# Patient Record
Sex: Male | Born: 1945
Health system: Southern US, Community
[De-identification: ages and names within clinical notes are randomized; demographics above are authoritative.]

## PROBLEM LIST (undated history)

## (undated) ENCOUNTER — Emergency Department (HOSPITAL_COMMUNITY): Discharge status: Against Medical Advice | Payer: Medicare Other

## (undated) DIAGNOSIS — C3490 Malignant neoplasm of unspecified part of unspecified bronchus or lung: Secondary | ICD-10-CM

## (undated) DIAGNOSIS — I10 Essential (primary) hypertension: Secondary | ICD-10-CM

## (undated) DIAGNOSIS — I739 Peripheral vascular disease, unspecified: Secondary | ICD-10-CM

## (undated) DIAGNOSIS — I639 Cerebral infarction, unspecified: Secondary | ICD-10-CM

## (undated) DIAGNOSIS — Z923 Personal history of irradiation: Secondary | ICD-10-CM

## (undated) DIAGNOSIS — R011 Cardiac murmur, unspecified: Secondary | ICD-10-CM

## (undated) DIAGNOSIS — I509 Heart failure, unspecified: Secondary | ICD-10-CM

## (undated) DIAGNOSIS — I2510 Atherosclerotic heart disease of native coronary artery without angina pectoris: Secondary | ICD-10-CM

## (undated) DIAGNOSIS — I499 Cardiac arrhythmia, unspecified: Secondary | ICD-10-CM

## (undated) DIAGNOSIS — D126 Benign neoplasm of colon, unspecified: Secondary | ICD-10-CM

## (undated) DIAGNOSIS — I701 Atherosclerosis of renal artery: Secondary | ICD-10-CM

## (undated) DIAGNOSIS — I350 Nonrheumatic aortic (valve) stenosis: Secondary | ICD-10-CM

## (undated) DIAGNOSIS — D693 Immune thrombocytopenic purpura: Secondary | ICD-10-CM

## (undated) DIAGNOSIS — D649 Anemia, unspecified: Secondary | ICD-10-CM

## (undated) DIAGNOSIS — Z5189 Encounter for other specified aftercare: Secondary | ICD-10-CM

## (undated) DIAGNOSIS — K219 Gastro-esophageal reflux disease without esophagitis: Secondary | ICD-10-CM

## (undated) DIAGNOSIS — E119 Type 2 diabetes mellitus without complications: Secondary | ICD-10-CM

## (undated) DIAGNOSIS — K5732 Diverticulitis of large intestine without perforation or abscess without bleeding: Secondary | ICD-10-CM

## (undated) DIAGNOSIS — G459 Transient cerebral ischemic attack, unspecified: Secondary | ICD-10-CM

## (undated) DIAGNOSIS — E785 Hyperlipidemia, unspecified: Secondary | ICD-10-CM

## (undated) DIAGNOSIS — IMO0001 Reserved for inherently not codable concepts without codable children: Secondary | ICD-10-CM

## (undated) DIAGNOSIS — J449 Chronic obstructive pulmonary disease, unspecified: Secondary | ICD-10-CM

## (undated) HISTORY — PX: POLYPECTOMY: SHX149

## (undated) HISTORY — DX: Peripheral vascular disease, unspecified: I73.9

## (undated) HISTORY — DX: Benign neoplasm of colon, unspecified: D12.6

## (undated) HISTORY — PX: ANGIOPLASTY / STENTING ILIAC: SUR31

## (undated) HISTORY — DX: Atherosclerotic heart disease of native coronary artery without angina pectoris: I25.10

## (undated) HISTORY — DX: Immune thrombocytopenic purpura: D69.3

## (undated) HISTORY — DX: Personal history of irradiation: Z92.3

## (undated) HISTORY — PX: CATARACT EXTRACTION W/ INTRAOCULAR LENS  IMPLANT, BILATERAL: SHX1307

## (undated) HISTORY — DX: Essential (primary) hypertension: I10

## (undated) HISTORY — DX: Transient cerebral ischemic attack, unspecified: G45.9

## (undated) HISTORY — DX: Cardiac murmur, unspecified: R01.1

## (undated) HISTORY — DX: Hyperlipidemia, unspecified: E78.5

## (undated) HISTORY — DX: Diverticulitis of large intestine without perforation or abscess without bleeding: K57.32

## (undated) HISTORY — PX: COLONOSCOPY: SHX174

## (undated) HISTORY — DX: Atherosclerosis of renal artery: I70.1

## (undated) HISTORY — DX: Nonrheumatic aortic (valve) stenosis: I35.0

## (undated) HISTORY — PX: AORTIC VALVE REPLACEMENT: SHX41

## (undated) HISTORY — PX: TONSILLECTOMY: SUR1361

---

## 1997-09-06 DIAGNOSIS — D126 Benign neoplasm of colon, unspecified: Secondary | ICD-10-CM

## 1997-09-06 HISTORY — DX: Benign neoplasm of colon, unspecified: D12.6

## 2000-09-27 ENCOUNTER — Other Ambulatory Visit: Admission: RE | Admit: 2000-09-27 | Discharge: 2000-09-27 | Payer: Self-pay | Admitting: Gastroenterology

## 2000-09-27 ENCOUNTER — Encounter (INDEPENDENT_AMBULATORY_CARE_PROVIDER_SITE_OTHER): Payer: Self-pay

## 2003-01-13 ENCOUNTER — Emergency Department (HOSPITAL_COMMUNITY): Admission: EM | Admit: 2003-01-13 | Discharge: 2003-01-14 | Payer: Self-pay | Admitting: Emergency Medicine

## 2003-01-13 ENCOUNTER — Encounter: Payer: Self-pay | Admitting: Emergency Medicine

## 2003-01-29 ENCOUNTER — Ambulatory Visit (HOSPITAL_COMMUNITY): Admission: RE | Admit: 2003-01-29 | Discharge: 2003-01-29 | Payer: Self-pay | Admitting: Oncology

## 2003-01-29 ENCOUNTER — Encounter: Payer: Self-pay | Admitting: Oncology

## 2003-02-05 ENCOUNTER — Inpatient Hospital Stay (HOSPITAL_COMMUNITY): Admission: EM | Admit: 2003-02-05 | Discharge: 2003-02-09 | Payer: Self-pay | Admitting: Oncology

## 2003-02-05 HISTORY — PX: SPLENECTOMY: SUR1306

## 2003-02-07 ENCOUNTER — Encounter: Payer: Self-pay | Admitting: Cardiology

## 2003-02-07 ENCOUNTER — Encounter: Payer: Self-pay | Admitting: Oncology

## 2003-02-16 ENCOUNTER — Inpatient Hospital Stay (HOSPITAL_COMMUNITY): Admission: RE | Admit: 2003-02-16 | Discharge: 2003-02-18 | Payer: Self-pay | Admitting: *Deleted

## 2003-02-16 ENCOUNTER — Encounter (INDEPENDENT_AMBULATORY_CARE_PROVIDER_SITE_OTHER): Payer: Self-pay | Admitting: *Deleted

## 2003-09-14 ENCOUNTER — Ambulatory Visit (HOSPITAL_COMMUNITY): Admission: RE | Admit: 2003-09-14 | Discharge: 2003-09-14 | Payer: Self-pay | Admitting: Cardiology

## 2003-09-14 ENCOUNTER — Encounter: Payer: Self-pay | Admitting: Cardiology

## 2003-10-23 ENCOUNTER — Ambulatory Visit (HOSPITAL_COMMUNITY): Admission: RE | Admit: 2003-10-23 | Discharge: 2003-10-23 | Payer: Self-pay | Admitting: Cardiovascular Disease

## 2003-11-07 ENCOUNTER — Encounter (INDEPENDENT_AMBULATORY_CARE_PROVIDER_SITE_OTHER): Payer: Self-pay | Admitting: Specialist

## 2003-11-07 ENCOUNTER — Inpatient Hospital Stay (HOSPITAL_COMMUNITY): Admission: RE | Admit: 2003-11-07 | Discharge: 2003-11-14 | Payer: Self-pay | Admitting: Surgery

## 2003-11-07 HISTORY — PX: CARDIAC VALVE REPLACEMENT: SHX585

## 2003-11-07 HISTORY — PX: CARDIAC CATHETERIZATION: SHX172

## 2003-11-07 HISTORY — PX: CORONARY ARTERY BYPASS GRAFT: SHX141

## 2003-12-17 ENCOUNTER — Encounter: Admission: RE | Admit: 2003-12-17 | Discharge: 2004-03-16 | Payer: Self-pay | Admitting: Cardiovascular Disease

## 2004-10-10 ENCOUNTER — Ambulatory Visit: Payer: Self-pay | Admitting: Cardiovascular Disease

## 2004-10-20 ENCOUNTER — Ambulatory Visit: Payer: Self-pay | Admitting: Cardiology

## 2004-11-10 ENCOUNTER — Ambulatory Visit: Payer: Self-pay | Admitting: Cardiology

## 2004-12-03 ENCOUNTER — Ambulatory Visit: Payer: Self-pay | Admitting: Internal Medicine

## 2004-12-31 ENCOUNTER — Ambulatory Visit: Payer: Self-pay | Admitting: Cardiology

## 2005-02-04 ENCOUNTER — Ambulatory Visit: Payer: Self-pay | Admitting: Cardiology

## 2005-02-18 ENCOUNTER — Ambulatory Visit: Payer: Self-pay | Admitting: *Deleted

## 2005-03-04 ENCOUNTER — Ambulatory Visit: Payer: Self-pay | Admitting: *Deleted

## 2005-03-25 ENCOUNTER — Ambulatory Visit: Payer: Self-pay | Admitting: Cardiology

## 2005-04-07 ENCOUNTER — Ambulatory Visit: Payer: Self-pay | Admitting: Cardiovascular Disease

## 2005-04-22 ENCOUNTER — Ambulatory Visit: Payer: Self-pay | Admitting: Cardiology

## 2005-05-20 ENCOUNTER — Ambulatory Visit: Payer: Self-pay | Admitting: Cardiology

## 2005-06-10 ENCOUNTER — Ambulatory Visit: Payer: Self-pay | Admitting: Internal Medicine

## 2005-06-21 ENCOUNTER — Inpatient Hospital Stay (HOSPITAL_COMMUNITY): Admission: EM | Admit: 2005-06-21 | Discharge: 2005-07-03 | Payer: Self-pay | Admitting: Vascular Surgery

## 2005-07-06 ENCOUNTER — Ambulatory Visit: Payer: Self-pay | Admitting: Cardiology

## 2005-07-13 ENCOUNTER — Ambulatory Visit: Payer: Self-pay | Admitting: Cardiology

## 2005-07-20 ENCOUNTER — Ambulatory Visit: Payer: Self-pay | Admitting: Cardiology

## 2005-07-29 ENCOUNTER — Ambulatory Visit: Payer: Self-pay | Admitting: Cardiology

## 2005-08-12 ENCOUNTER — Ambulatory Visit: Payer: Self-pay | Admitting: Cardiology

## 2005-09-09 ENCOUNTER — Ambulatory Visit: Payer: Self-pay | Admitting: Cardiology

## 2005-10-07 ENCOUNTER — Ambulatory Visit: Payer: Self-pay | Admitting: *Deleted

## 2005-10-22 ENCOUNTER — Ambulatory Visit: Payer: Self-pay | Admitting: Cardiovascular Disease

## 2005-11-04 ENCOUNTER — Ambulatory Visit: Payer: Self-pay | Admitting: Cardiology

## 2005-12-14 ENCOUNTER — Ambulatory Visit: Payer: Self-pay | Admitting: Cardiology

## 2006-01-11 ENCOUNTER — Ambulatory Visit: Payer: Self-pay | Admitting: Internal Medicine

## 2006-02-17 ENCOUNTER — Ambulatory Visit: Payer: Self-pay | Admitting: Cardiology

## 2006-04-08 ENCOUNTER — Ambulatory Visit: Payer: Self-pay | Admitting: Cardiovascular Disease

## 2006-05-12 ENCOUNTER — Ambulatory Visit: Payer: Self-pay | Admitting: Cardiology

## 2006-06-11 ENCOUNTER — Ambulatory Visit: Payer: Self-pay | Admitting: Internal Medicine

## 2006-06-25 ENCOUNTER — Ambulatory Visit: Payer: Self-pay | Admitting: Cardiology

## 2006-07-14 ENCOUNTER — Ambulatory Visit: Payer: Self-pay | Admitting: Cardiology

## 2006-09-28 ENCOUNTER — Ambulatory Visit: Payer: Self-pay | Admitting: Cardiology

## 2006-10-12 ENCOUNTER — Ambulatory Visit: Payer: Self-pay | Admitting: *Deleted

## 2006-11-02 ENCOUNTER — Ambulatory Visit: Payer: Self-pay | Admitting: Cardiology

## 2006-12-14 ENCOUNTER — Ambulatory Visit: Payer: Self-pay | Admitting: Cardiology

## 2007-01-11 ENCOUNTER — Ambulatory Visit: Payer: Self-pay | Admitting: Cardiology

## 2007-02-08 ENCOUNTER — Ambulatory Visit: Payer: Self-pay | Admitting: Cardiology

## 2007-03-08 ENCOUNTER — Ambulatory Visit: Payer: Self-pay | Admitting: *Deleted

## 2007-04-15 ENCOUNTER — Ambulatory Visit: Payer: Self-pay | Admitting: Cardiovascular Disease

## 2007-04-27 ENCOUNTER — Ambulatory Visit: Payer: Self-pay

## 2007-05-11 ENCOUNTER — Ambulatory Visit: Payer: Self-pay | Admitting: Cardiology

## 2007-05-13 ENCOUNTER — Ambulatory Visit: Payer: Self-pay | Admitting: Vascular Surgery

## 2007-05-14 ENCOUNTER — Inpatient Hospital Stay (HOSPITAL_COMMUNITY): Admission: AD | Admit: 2007-05-14 | Discharge: 2007-05-18 | Payer: Self-pay | Admitting: Vascular Surgery

## 2007-05-17 ENCOUNTER — Ambulatory Visit: Payer: Self-pay | Admitting: Vascular Surgery

## 2007-05-25 ENCOUNTER — Ambulatory Visit: Payer: Self-pay | Admitting: Cardiology

## 2007-06-08 ENCOUNTER — Ambulatory Visit: Payer: Self-pay | Admitting: Cardiology

## 2007-06-08 LAB — CONVERTED CEMR LAB
INR: 4.6 — ABNORMAL HIGH (ref 0.9–2.0)
Prothrombin Time: 27.6 s — ABNORMAL HIGH (ref 10.0–14.0)

## 2007-06-17 ENCOUNTER — Ambulatory Visit: Payer: Self-pay | Admitting: Cardiology

## 2007-07-07 ENCOUNTER — Ambulatory Visit: Payer: Self-pay | Admitting: Cardiology

## 2007-08-31 ENCOUNTER — Ambulatory Visit: Payer: Self-pay | Admitting: Vascular Surgery

## 2007-09-07 ENCOUNTER — Ambulatory Visit: Payer: Self-pay | Admitting: Cardiology

## 2007-10-05 ENCOUNTER — Ambulatory Visit: Payer: Self-pay | Admitting: Cardiology

## 2007-10-26 ENCOUNTER — Ambulatory Visit: Payer: Self-pay | Admitting: Cardiovascular Disease

## 2007-10-26 ENCOUNTER — Ambulatory Visit: Payer: Self-pay | Admitting: Cardiology

## 2007-10-26 LAB — CONVERTED CEMR LAB
INR: 3.5 — ABNORMAL HIGH (ref 0.8–1.0)
Prothrombin Time: 23.9 s — ABNORMAL HIGH (ref 10.9–13.3)

## 2007-11-16 ENCOUNTER — Ambulatory Visit: Payer: Self-pay | Admitting: Cardiovascular Disease

## 2007-12-14 ENCOUNTER — Ambulatory Visit: Payer: Self-pay | Admitting: Cardiovascular Disease

## 2008-01-12 ENCOUNTER — Ambulatory Visit: Payer: Self-pay | Admitting: Cardiology

## 2008-02-14 ENCOUNTER — Ambulatory Visit: Payer: Self-pay | Admitting: Cardiology

## 2008-03-06 ENCOUNTER — Ambulatory Visit: Payer: Self-pay | Admitting: Internal Medicine

## 2008-08-09 ENCOUNTER — Ambulatory Visit: Payer: Self-pay | Admitting: Vascular Surgery

## 2008-10-03 ENCOUNTER — Ambulatory Visit: Payer: Self-pay | Admitting: Cardiology

## 2008-10-12 ENCOUNTER — Ambulatory Visit: Payer: Self-pay | Admitting: Cardiovascular Disease

## 2008-10-22 DIAGNOSIS — K5732 Diverticulitis of large intestine without perforation or abscess without bleeding: Secondary | ICD-10-CM | POA: Insufficient documentation

## 2008-10-22 DIAGNOSIS — D693 Immune thrombocytopenic purpura: Secondary | ICD-10-CM | POA: Insufficient documentation

## 2008-10-22 DIAGNOSIS — Z860100 Personal history of colon polyps, unspecified: Secondary | ICD-10-CM | POA: Insufficient documentation

## 2008-10-22 DIAGNOSIS — I739 Peripheral vascular disease, unspecified: Secondary | ICD-10-CM | POA: Insufficient documentation

## 2008-10-22 DIAGNOSIS — E119 Type 2 diabetes mellitus without complications: Secondary | ICD-10-CM | POA: Insufficient documentation

## 2008-10-22 DIAGNOSIS — I10 Essential (primary) hypertension: Secondary | ICD-10-CM | POA: Insufficient documentation

## 2008-10-22 DIAGNOSIS — E7800 Pure hypercholesterolemia, unspecified: Secondary | ICD-10-CM | POA: Insufficient documentation

## 2008-10-22 DIAGNOSIS — I359 Nonrheumatic aortic valve disorder, unspecified: Secondary | ICD-10-CM | POA: Insufficient documentation

## 2008-10-26 ENCOUNTER — Ambulatory Visit: Payer: Self-pay | Admitting: Gastroenterology

## 2008-11-05 ENCOUNTER — Ambulatory Visit: Payer: Self-pay | Admitting: Cardiovascular Disease

## 2008-11-07 ENCOUNTER — Ambulatory Visit: Payer: Self-pay | Admitting: Gastroenterology

## 2008-11-15 ENCOUNTER — Ambulatory Visit: Payer: Self-pay | Admitting: Cardiology

## 2008-12-10 ENCOUNTER — Ambulatory Visit: Payer: Self-pay | Admitting: Internal Medicine

## 2009-01-10 ENCOUNTER — Ambulatory Visit: Payer: Self-pay | Admitting: Internal Medicine

## 2009-02-07 ENCOUNTER — Ambulatory Visit: Payer: Self-pay | Admitting: Cardiovascular Disease

## 2009-04-10 ENCOUNTER — Ambulatory Visit: Payer: Self-pay | Admitting: Cardiovascular Disease

## 2009-05-08 ENCOUNTER — Ambulatory Visit: Payer: Self-pay | Admitting: Cardiology

## 2009-05-08 ENCOUNTER — Encounter: Payer: Self-pay | Admitting: *Deleted

## 2009-05-08 LAB — CONVERTED CEMR LAB
POC INR: 5.4
Protime: 23.6

## 2009-05-17 ENCOUNTER — Ambulatory Visit: Payer: Self-pay | Admitting: Cardiovascular Disease

## 2009-05-22 ENCOUNTER — Ambulatory Visit: Payer: Self-pay | Admitting: Cardiology

## 2009-05-22 LAB — CONVERTED CEMR LAB
POC INR: 1.7
Protime: 16.1

## 2009-06-06 DEATH — deceased

## 2009-06-12 ENCOUNTER — Ambulatory Visit: Payer: Self-pay | Admitting: Internal Medicine

## 2009-06-12 ENCOUNTER — Encounter: Payer: Self-pay | Admitting: *Deleted

## 2009-06-12 LAB — CONVERTED CEMR LAB
POC INR: 2.9
Prothrombin Time: 20.5 s

## 2009-06-18 ENCOUNTER — Encounter: Payer: Self-pay | Admitting: Cardiovascular Disease

## 2009-06-18 ENCOUNTER — Ambulatory Visit: Payer: Self-pay

## 2009-08-14 ENCOUNTER — Ambulatory Visit: Payer: Self-pay | Admitting: Vascular Surgery

## 2009-08-23 ENCOUNTER — Encounter (INDEPENDENT_AMBULATORY_CARE_PROVIDER_SITE_OTHER): Payer: Self-pay | Admitting: *Deleted

## 2009-09-03 ENCOUNTER — Emergency Department (HOSPITAL_COMMUNITY): Admission: EM | Admit: 2009-09-03 | Discharge: 2009-09-03 | Payer: Self-pay | Admitting: Emergency Medicine

## 2009-09-05 ENCOUNTER — Encounter: Payer: Self-pay | Admitting: Cardiovascular Disease

## 2009-09-09 ENCOUNTER — Encounter (INDEPENDENT_AMBULATORY_CARE_PROVIDER_SITE_OTHER): Payer: Self-pay | Admitting: Internal Medicine

## 2009-09-09 ENCOUNTER — Ambulatory Visit: Payer: Self-pay

## 2009-09-09 ENCOUNTER — Ambulatory Visit: Payer: Self-pay | Admitting: Cardiovascular Disease

## 2009-09-09 ENCOUNTER — Ambulatory Visit (HOSPITAL_COMMUNITY): Admission: RE | Admit: 2009-09-09 | Discharge: 2009-09-09 | Payer: Self-pay | Admitting: Cardiovascular Disease

## 2009-09-11 ENCOUNTER — Ambulatory Visit: Payer: Self-pay | Admitting: Internal Medicine

## 2009-09-11 LAB — CONVERTED CEMR LAB: POC INR: 3.3

## 2009-09-13 ENCOUNTER — Encounter: Payer: Self-pay | Admitting: Cardiovascular Disease

## 2009-10-02 ENCOUNTER — Ambulatory Visit: Payer: Self-pay

## 2009-10-02 LAB — CONVERTED CEMR LAB: POC INR: 2.8

## 2009-10-04 DIAGNOSIS — L719 Rosacea, unspecified: Secondary | ICD-10-CM | POA: Insufficient documentation

## 2009-10-04 DIAGNOSIS — K219 Gastro-esophageal reflux disease without esophagitis: Secondary | ICD-10-CM | POA: Insufficient documentation

## 2009-10-04 DIAGNOSIS — G4700 Insomnia, unspecified: Secondary | ICD-10-CM | POA: Insufficient documentation

## 2009-10-09 ENCOUNTER — Telehealth: Payer: Self-pay | Admitting: Cardiovascular Disease

## 2009-10-10 ENCOUNTER — Ambulatory Visit: Payer: Self-pay | Admitting: Cardiovascular Disease

## 2009-10-10 DIAGNOSIS — G459 Transient cerebral ischemic attack, unspecified: Secondary | ICD-10-CM | POA: Insufficient documentation

## 2009-11-12 ENCOUNTER — Ambulatory Visit: Payer: Self-pay | Admitting: Cardiovascular Disease

## 2009-11-12 LAB — CONVERTED CEMR LAB: POC INR: 1.8

## 2009-12-10 ENCOUNTER — Ambulatory Visit: Payer: Self-pay | Admitting: Cardiology

## 2009-12-10 LAB — CONVERTED CEMR LAB: POC INR: 1.8

## 2010-01-07 ENCOUNTER — Ambulatory Visit: Payer: Self-pay | Admitting: Internal Medicine

## 2010-01-07 LAB — CONVERTED CEMR LAB: POC INR: 2.3

## 2010-02-04 ENCOUNTER — Ambulatory Visit: Payer: Self-pay | Admitting: Cardiovascular Disease

## 2010-02-04 LAB — CONVERTED CEMR LAB: POC INR: 2.3

## 2010-02-12 ENCOUNTER — Encounter: Payer: Self-pay | Admitting: Cardiovascular Disease

## 2010-02-12 ENCOUNTER — Ambulatory Visit: Payer: Self-pay | Admitting: Vascular Surgery

## 2010-02-13 DIAGNOSIS — E1151 Type 2 diabetes mellitus with diabetic peripheral angiopathy without gangrene: Secondary | ICD-10-CM | POA: Insufficient documentation

## 2010-02-14 ENCOUNTER — Encounter: Payer: Self-pay | Admitting: Cardiovascular Disease

## 2010-03-11 ENCOUNTER — Ambulatory Visit: Payer: Self-pay | Admitting: Cardiovascular Disease

## 2010-04-09 ENCOUNTER — Ambulatory Visit: Payer: Self-pay | Admitting: Cardiology

## 2010-04-09 LAB — CONVERTED CEMR LAB: POC INR: 2.8

## 2010-05-07 ENCOUNTER — Ambulatory Visit: Payer: Self-pay | Admitting: Cardiology

## 2010-05-07 LAB — CONVERTED CEMR LAB: POC INR: 2.5

## 2010-06-04 ENCOUNTER — Ambulatory Visit: Payer: Self-pay | Admitting: Cardiology

## 2010-06-04 LAB — CONVERTED CEMR LAB: POC INR: 2.6

## 2010-07-02 ENCOUNTER — Ambulatory Visit: Payer: Self-pay | Admitting: Cardiology

## 2010-07-02 LAB — CONVERTED CEMR LAB: POC INR: 2.4

## 2010-07-29 ENCOUNTER — Ambulatory Visit: Payer: Self-pay | Admitting: Cardiovascular Disease

## 2010-07-29 LAB — CONVERTED CEMR LAB: POC INR: 20479111

## 2010-08-26 ENCOUNTER — Ambulatory Visit: Payer: Self-pay | Admitting: Cardiology

## 2010-08-26 LAB — CONVERTED CEMR LAB: POC INR: 2.2

## 2010-09-24 ENCOUNTER — Encounter: Payer: Self-pay | Admitting: Cardiovascular Disease

## 2010-09-24 ENCOUNTER — Ambulatory Visit: Payer: Self-pay | Admitting: Vascular Surgery

## 2010-10-21 ENCOUNTER — Ambulatory Visit: Payer: Self-pay | Admitting: Cardiovascular Disease

## 2010-10-21 ENCOUNTER — Encounter: Payer: Self-pay | Admitting: Cardiovascular Disease

## 2010-10-21 DIAGNOSIS — I2510 Atherosclerotic heart disease of native coronary artery without angina pectoris: Secondary | ICD-10-CM | POA: Insufficient documentation

## 2010-10-22 DIAGNOSIS — J309 Allergic rhinitis, unspecified: Secondary | ICD-10-CM | POA: Insufficient documentation

## 2010-10-22 DIAGNOSIS — Z952 Presence of prosthetic heart valve: Secondary | ICD-10-CM | POA: Insufficient documentation

## 2010-11-17 ENCOUNTER — Ambulatory Visit: Payer: Self-pay | Admitting: Cardiovascular Disease

## 2010-11-21 ENCOUNTER — Ambulatory Visit: Payer: Self-pay

## 2010-11-26 ENCOUNTER — Telehealth: Payer: Self-pay | Admitting: Cardiovascular Disease

## 2010-12-10 ENCOUNTER — Encounter: Payer: Self-pay | Admitting: Cardiovascular Disease

## 2010-12-12 ENCOUNTER — Ambulatory Visit
Admission: RE | Admit: 2010-12-12 | Discharge: 2010-12-12 | Payer: Self-pay | Source: Home / Self Care | Attending: Internal Medicine | Admitting: Internal Medicine

## 2010-12-12 LAB — CONVERTED CEMR LAB: POC INR: 2.7

## 2010-12-17 ENCOUNTER — Encounter: Payer: Self-pay | Admitting: Cardiovascular Disease

## 2010-12-19 ENCOUNTER — Ambulatory Visit (HOSPITAL_COMMUNITY)
Admission: RE | Admit: 2010-12-19 | Discharge: 2010-12-19 | Payer: Self-pay | Source: Home / Self Care | Attending: Cardiovascular Disease | Admitting: Cardiovascular Disease

## 2010-12-22 LAB — CBC
HCT: 44.2 % (ref 39.0–52.0)
Hemoglobin: 15.2 g/dL (ref 13.0–17.0)
MCH: 32.4 pg (ref 26.0–34.0)
MCHC: 34.4 g/dL (ref 30.0–36.0)
MCV: 94.2 fL (ref 78.0–100.0)
Platelets: 228 10*3/uL (ref 150–400)
RBC: 4.69 MIL/uL (ref 4.22–5.81)
RDW: 13.5 % (ref 11.5–15.5)
WBC: 7 10*3/uL (ref 4.0–10.5)

## 2010-12-22 LAB — BASIC METABOLIC PANEL
BUN: 9 mg/dL (ref 6–23)
CO2: 25 mEq/L (ref 19–32)
Calcium: 9.2 mg/dL (ref 8.4–10.5)
Chloride: 105 mEq/L (ref 96–112)
Creatinine, Ser: 0.81 mg/dL (ref 0.4–1.5)
GFR calc Af Amer: >60 mL/min (ref >60)
GFR calc non Af Amer: >60 mL/min (ref >60)
Glucose, Bld: 217 mg/dL — ABNORMAL HIGH (ref 70–99)
Potassium: 4.4 mEq/L (ref 3.5–5.1)
Sodium: 140 mEq/L (ref 135–145)

## 2010-12-22 LAB — PROTIME-INR
INR: 0.85 (ref 0.00–1.49)
Prothrombin Time: 11.8 seconds (ref 11.6–15.2)

## 2010-12-22 LAB — GLUCOSE, CAPILLARY
Glucose-Capillary: 199 mg/dL — ABNORMAL HIGH (ref 70–99)
Glucose-Capillary: 155 mg/dL — ABNORMAL HIGH (ref 70–99)

## 2010-12-23 ENCOUNTER — Ambulatory Visit: Admission: RE | Admit: 2010-12-23 | Discharge: 2010-12-23 | Payer: Self-pay | Source: Home / Self Care

## 2010-12-23 LAB — CONVERTED CEMR LAB: POC INR: 1.8

## 2010-12-30 ENCOUNTER — Ambulatory Visit: Admission: RE | Admit: 2010-12-30 | Discharge: 2010-12-30 | Payer: Self-pay | Source: Home / Self Care

## 2010-12-30 LAB — CONVERTED CEMR LAB: POC INR: 1.9

## 2011-01-01 ENCOUNTER — Telehealth: Payer: Self-pay | Admitting: Cardiovascular Disease

## 2011-01-06 NOTE — Assessment & Plan Note (Signed)
Summary: f98m/mj   Visit Type:  6 mo f/u Primary Provider:  [name],MD  CC:  denies any cardiac complaints today.  History of Present Illness: [name] [name] is seen today for F/U.  He is S/P AVR on chronic anticoagulation.  He had a possible TIA last November when his INR was subRx.  His does has been increased and running better with no recurrent TIA;s/  I told him to take a baby ASA with his coumadin.  AVR normal by TTE and he did not want to have a TEE.  His lipids and BP have been under good control  He continues to have 2 martini's a night.  He denies SOB, palpitations, SSCP or edema.  He has had a distant left iliac stent.  Normally followed by Dr Darrick Penna.  Wants to switch care to [place].  No current claudication.  Incidicates ABI's last month or two with Dr Darrick Penna  Will get records and refer to Dr Excell Seltzer.  HAD CABG with AVR in 2004  No angina.  ON baby asa and BB.    Current Problems (verified): 1)  Tia  (ICD-435.9) 2)  Immune Thrombocytopenic Purpura  (ICD-287.31) 3)  Diabetes Mellitus  (ICD-250.00) 4)  Aortic Stenosis  (ICD-424.1) 5)  Claudication  (ICD-443.9) 6)  Hypertension  (ICD-401.9) 7)  Hypercholesterolemia  (ICD-272.0) 8)  Coumadin Therapy  (ICD-V58.61) 9)  Diverticulitis, Colon  (ICD-562.11) 10)  Colonic Polyps, Hx of  (ICD-V12.72)  Current Medications (verified): 1)  Mavik 4 Mg Tabs (Trandolapril) .... Take Two By Mouth Once Daily 2)  Toprol Xl 50 Mg Xr24h-Tab (Metoprolol Succinate) .... Take One By Mouth Once Daily 3)  Lipitor 40 Mg Tabs (Atorvastatin Calcium) .... Take One Tablet By Mouth Daily. 4)  Metformin Hcl 500 Mg Tabs (Metformin Hcl) .... Take Two Tabs in The Morning and One in At Bedtime 5)  Coumadin 7.5 Mg Tabs (Warfarin Sodium) .... As Directed 6)  Hydrochlorothiazide 12.5 Mg Tabs (Hydrochlorothiazide) .... Take One Tablet By Mouth Daily.  Allergies (verified): No Known Drug Allergies  Past History:  Past Medical History: Last updated: 10/22/2008 Current  Problems:  IMMUNE THROMBOCYTOPENIC PURPURA (ICD-287.31) DIABETES MELLITUS (ICD-250.00) AORTIC STENOSIS (ICD-424.1) CLAUDICATION (ICD-443.9) HYPERTENSION (ICD-401.9) HYPERCHOLESTEROLEMIA (ICD-272.0) COUMADIN THERAPY (ICD-V58.61) DIVERTICULITIS, COLON (ICD-562.11) COLONIC POLYPS, HX OF (ICD-V12.72)  Past Surgical History: Last updated: 10/26/2008 aortic valve replacement   stent placements laparoscopic splenectomy  Family History: Last updated: 10/26/2008 No FH of Colon Cancer: Family History of Breast Cancer: sister Family History of Diabetes: grandmother  Social History: Last updated: 10/26/2008 Alcohol Use - yes Patient is a former smoker.  Illicit Drug Use - no Daily Caffeine Use coffee  Review of Systems       Denies fever, malais, weight loss, blurry vision, decreased visual acuity, cough, sputum, SOB, hemoptysis, pleuritic pain, palpitaitons, heartburn, abdominal pain, melena, lower extremity edema, claudication, or rash.   Vital Signs:  Patient profile:   65 year old male Height:      68 inches Weight:      178.8 pounds BMI:     27.28 Pulse rate:   66 / minute Pulse rhythm:   irregular BP sitting:   166 / 94  (left arm) Cuff size:   regular  Vitals Entered By: Danielle Rankin, CMA (October 21, 2010 9:35 AM)  Physical Exam  General:  Affect appropriate Healthy:  appears stated age HEENT: normal Neck supple with no adenopathy JVP normal no bruits no thyromegaly Lungs clear with no wheezing and good diaphragmatic motion Heart:  S1/S2click with SEB  murmur, no AR and no rub, gallop or click PMI normal Abdomen: benighn, BS positve, no tenderness, no AAA no bruit.  No HSM or HJR Distal pulses intact with no bruits No edema Neuro non-focal Skin warm and dry    Impression & Recommendations:  Problem # 1:  AORTIC STENOSIS (ICD-424.1) S/P AVR in 2004  normal exam and echo 2010.  Continue coumadin  F/U monthly  has periodontal disease and sees dentist  quarterly with SBE prophylaxis His updated medication list for this problem includes:    Mavik 4 Mg Tabs (Trandolapril) .[name]... Take two by mouth once daily    Toprol Xl 50 Mg Xr24h-tab (Metoprolol succinate) .[name]... Take one by mouth once daily    Hydrochlorothiazide 12.5 Mg Tabs (Hydrochlorothiazide) .[name]... Take one tablet by mouth daily.  Problem # 2:  TIA (ICD-435.9) Normal Carotids in 2010.  In setting of subRx INR  Told him he should be bridged for any procedures with Lovenox  Problem # 3:  CLAUDICATION (ICD-443.9) Records from [facility]  F/U with Dr Excell Seltzer . Previous left ilac stent.    Problem # 4:  HYPERTENSION (ICD-401.9) Borderline control.  Encouraged him to get home BP cuff.  Likely related to drinking.  Compliant with meds His updated medication list for this problem includes:    Mavik 4 Mg Tabs (Trandolapril) .[name]... Take two by mouth once daily    Toprol Xl 50 Mg Xr24h-tab (Metoprolol succinate) .[name]... Take one by mouth once daily    Hydrochlorothiazide 12.5 Mg Tabs (Hydrochlorothiazide) .[name]... Take one tablet by mouth daily.  Problem # 5:  HYPERCHOLESTEROLEMIA (ICD-272.0) Labs with Dr Timothy Lasso tomorrow.  Continue statin with CAD His updated medication list for this problem includes:    Lipitor 40 Mg Tabs (Atorvastatin calcium) .[name]... Take one tablet by mouth daily.  Problem # 6:  CAD (ICD-414.00) No angina  Active  Continue asa and BB  ECG normal today His updated medication list for this problem includes:    Mavik 4 Mg Tabs (Trandolapril) .[name]... Take two by mouth once daily    Toprol Xl 50 Mg Xr24h-tab (Metoprolol succinate) .[name]... Take one by mouth once daily    Coumadin 7.5 Mg Tabs (Warfarin sodium) .[name]... As directed  Other Orders: Misc. Referral (Misc. Ref)  Patient Instructions: 1)  Your physician recommends that you schedule a follow-up appointment in: YEAR WITH DR Eden Emms 2)  Your physician recommends that you continue on your current medications as directed. Please refer to  the Current Medication list given to you today. 3)  You have been referred to DR COOPER DX PVD   EKG Report  Procedure date:  10/21/2010  Findings:      NSR Normal ECG

## 2011-01-06 NOTE — Medication Information (Signed)
Summary: ccr  Anticoagulant Therapy  Managed by: Bethena Midget, RN, BSN Referring MD: Charlton Haws MD PCP: Candis Schatz Supervising MD: Jens Som MD, Arlys [name] Indication 1: Aortic Valve Replacement (ICD-V43.3) Indication 2: Aortic Valve Disorder (ICD-424.1) Lab Used: [facility]  Site: [facility] INR POC 2.8 INR RANGE 2 - 3  Dietary changes: no    Health status changes: no    Bleeding/hemorrhagic complications: no    Recent/future hospitalizations: no    Any changes in medication regimen? no    Recent/future dental: no  Any missed doses?: no       Is patient compliant with meds? yes       Allergies: No Known Drug Allergies  Anticoagulation Management History:      The patient is taking warfarin and comes in today for a routine follow up visit.  Positive risk factors for bleeding include history of CVA/TIA and presence of serious comorbidities.  Negative risk factors for bleeding include an age less than 39 years old.  The bleeding index is 'intermediate risk'.  Positive CHADS2 values include History of HTN, History of Diabetes, and Prior Stroke/CVA/TIA.  Negative CHADS2 values include Age > 34 years old.  The start date was 11/14/2003.  His last INR was 3.5 RATIO.  Anticoagulation responsible provider: Jens Som MD, Arlys [name].  INR POC: 2.8.  Cuvette Lot#: [id number].  Exp: 05/2011.    Anticoagulation Management Assessment/Plan:      The patient's current anticoagulation dose is Coumadin 7.5 mg tabs: as directed.  The target INR is 2 - 3.  The next INR is due 05/07/2010.  Anticoagulation instructions were given to patient.  Results were reviewed/authorized by Bethena Midget, RN, BSN.  He was notified by Bethena Midget, RN, BSN.         Prior Anticoagulation Instructions: INR 2.3  Continue taking 1/2 tablet on Monday and Friday and 1 tablet all other days.  Return to clinic in 4 weeks.   Current Anticoagulation Instructions: INR 2.8 Continue 7.5mg s daily except 3.75mg s on Mondays and  Fridays. Recheck in 4 weeks.

## 2011-01-06 NOTE — Assessment & Plan Note (Signed)
Summary: 6 mon rov AVR TIA HTN  pfh, rn   Primary Provider:  [name],MD  CC:  no complaints.  History of Present Illness: [name] [name] is seen today for F/U.  He is S/P AVR on chronic anticoagulation.  He had a possible TIA last November when his INR was subRx.  His does has been increased and running better with no recurrent TIA;s/  I told him to take a baby ASA with his coumadin.  AVR normal by TTE and he did not want to have a TEE.  His lipids and BP have been under good control  He continues to have 2 martini's a night.  He denies SOB, palpitations, SSCP or edema.    Current Problems (verified): 1)  Tia  (ICD-435.9) 2)  Immune Thrombocytopenic Purpura  (ICD-287.31) 3)  Diabetes Mellitus  (ICD-250.00) 4)  Aortic Stenosis  (ICD-424.1) 5)  Claudication  (ICD-443.9) 6)  Hypertension  (ICD-401.9) 7)  Hypercholesterolemia  (ICD-272.0) 8)  Coumadin Therapy  (ICD-V58.61) 9)  Diverticulitis, Colon  (ICD-562.11) 10)  Colonic Polyps, Hx of  (ICD-V12.72)  Current Medications (verified): 1)  Mavik 4 Mg Tabs (Trandolapril) .... Take Two By Mouth Once Daily 2)  Toprol Xl 50 Mg Xr24h-Tab (Metoprolol Succinate) .... Take One By Mouth Once Daily 3)  Lipitor 40 Mg Tabs (Atorvastatin Calcium) .... Take One Tablet By Mouth Daily. 4)  Metformin Hcl 500 Mg Tabs (Metformin Hcl) .... Take Two Tabs in The Morning and One in At Bedtime 5)  Coumadin 7.5 Mg Tabs (Warfarin Sodium) .... As Directed 6)  Hydrochlorothiazide 12.5 Mg Tabs (Hydrochlorothiazide) .... Take One Tablet By Mouth Daily.  Allergies (verified): No Known Drug Allergies  Past History:  Past Medical History: Last updated: 10/22/2008 Current Problems:  IMMUNE THROMBOCYTOPENIC PURPURA (ICD-287.31) DIABETES MELLITUS (ICD-250.00) AORTIC STENOSIS (ICD-424.1) CLAUDICATION (ICD-443.9) HYPERTENSION (ICD-401.9) HYPERCHOLESTEROLEMIA (ICD-272.0) COUMADIN THERAPY (ICD-V58.61) DIVERTICULITIS, COLON (ICD-562.11) COLONIC POLYPS, HX OF  (ICD-V12.72)  Past Surgical History: Last updated: 10/26/2008 aortic valve replacement   stent placements laparoscopic splenectomy  Family History: Last updated: 10/26/2008 No FH of Colon Cancer: Family History of Breast Cancer: sister Family History of Diabetes: grandmother  Social History: Last updated: 10/26/2008 Alcohol Use - yes Patient is a former smoker.  Illicit Drug Use - no Daily Caffeine Use coffee  Review of Systems       Denies fever, malais, weight loss, blurry vision, decreased visual acuity, cough, sputum, SOB, hemoptysis, pleuritic pain, palpitaitons, heartburn, abdominal pain, melena, lower extremity edema, claudication, or rash.   Vital Signs:  Patient profile:   65 year old male Height:      68 inches Weight:      182 pounds BMI:     27.77 Pulse rate:   70 / minute Resp:     12 per minute BP sitting:   144 / 88  (left arm)  Vitals Entered By: Kem Parkinson (March 11, 2010 4:26 PM)  Physical Exam  General:  Affect appropriate Healthy:  appears stated age HEENT: normal Neck supple with no adenopathy JVP normal no bruits no thyromegaly Lungs clear with no wheezing and good diaphragmatic motion Heart:  S1/S2 click SEM  no AR  murmur,rub, gallop or click PMI normal Abdomen: benighn, BS positve, no tenderness, no AAA no bruit.  No HSM or HJR Distal pulses intact with no bruits No edema Neuro non-focal Skin warm and dry Sty over right eye   Impression & Recommendations:  Problem # 1:  TIA (ICD-435.9) At ASA and continue to  make sure coumadin is Rx.  Carotids normal.  Will insist on TEE if any recurrence with Rx INR  Problem # 2:  AORTIC STENOSIS (ICD-424.1) S/P AVR with normal gradients and no AR His updated medication list for this problem includes:    Mavik 4 Mg Tabs (Trandolapril) .[name]... Take two by mouth once daily    Toprol Xl 50 Mg Xr24h-tab (Metoprolol succinate) .[name]... Take one by mouth once daily    Hydrochlorothiazide 12.5 Mg  Tabs (Hydrochlorothiazide) .[name]... Take one tablet by mouth daily.  Problem # 3:  HYPERTENSION (ICD-401.9) Well controlled His updated medication list for this problem includes:    Mavik 4 Mg Tabs (Trandolapril) .[name]... Take two by mouth once daily    Toprol Xl 50 Mg Xr24h-tab (Metoprolol succinate) .[name]... Take one by mouth once daily    Hydrochlorothiazide 12.5 Mg Tabs (Hydrochlorothiazide) .[name]... Take one tablet by mouth daily.  Problem # 4:  HYPERCHOLESTEROLEMIA (ICD-272.0) Continue statin.  Labs per Dr Timothy Lasso His updated medication list for this problem includes:    Lipitor 40 Mg Tabs (Atorvastatin calcium) .[name]... Take one tablet by mouth daily.

## 2011-01-06 NOTE — Medication Information (Signed)
Summary: rov/sp  Anticoagulant Therapy  Managed by: Weston Brass, PharmD Referring MD: Charlton Haws MD PCP: Candis Schatz Supervising MD: Riley Kill MD, Maisie Fus Indication 1: Aortic Valve Replacement (ICD-V43.3) Indication 2: Aortic Valve Disorder (ICD-424.1) Lab Used: [facility] Site: [facility] INR POC 2.6 INR RANGE 2 - 3  Dietary changes: no    Health status changes: no    Bleeding/hemorrhagic complications: no    Recent/future hospitalizations: no    Any changes in medication regimen? no    Recent/future dental: no  Any missed doses?: no       Is patient compliant with meds? yes       Allergies: No Known Drug Allergies  Anticoagulation Management History:      The patient is taking warfarin and comes in today for a routine follow up visit.  Positive risk factors for bleeding include history of CVA/TIA and presence of serious comorbidities.  Negative risk factors for bleeding include an age less than 68 years old.  The bleeding index is 'intermediate risk'.  Positive CHADS2 values include History of HTN, History of Diabetes, and Prior Stroke/CVA/TIA.  Negative CHADS2 values include Age > 41 years old.  The start date was 11/14/2003.  His last INR was 3.5 RATIO.  Anticoagulation responsible provider: Riley Kill MD, Maisie Fus.  INR POC: 2.6.  Exp: 07/2011.    Anticoagulation Management Assessment/Plan:      The patient's current anticoagulation dose is Coumadin 7.5 mg tabs: as directed.  The target INR is 2 - 3.  The next INR is due 07/02/2010.  Anticoagulation instructions were given to patient.  Results were reviewed/authorized by Weston Brass, PharmD.  He was notified by Weston Brass PharmD.         Prior Anticoagulation Instructions: INR 2.5  Continue same dose of 1 tablet every day except 1/2 tablet on Monday and Friday   Current Anticoagulation Instructions: INR 2.6  Continue same dose of 1 tablet every day except 1/2 tablet on Monday and Friday.

## 2011-01-06 NOTE — Medication Information (Signed)
Summary: rov/tm  Anticoagulant Therapy  Managed by: Cloyde Reams, RN, BSN Referring MD: Charlton Haws MD PCP: Candis Schatz Supervising MD: Clifton James MD, Cristal Deer Indication 1: Aortic Valve Replacement (ICD-V43.3) Indication 2: Aortic Valve Disorder (ICD-424.1) Lab Used: [facility] Site: [facility] INR POC [id number] INR RANGE 2 - 3  Dietary changes: no    Health status changes: no    Bleeding/hemorrhagic complications: no    Recent/future hospitalizations: no    Any changes in medication regimen? no    Recent/future dental: no  Any missed doses?: yes     Details: Missed 1 dosage a couple weeks ago.  Is patient compliant with meds? yes       Allergies: No Known Drug Allergies  Anticoagulation Management History:      The patient is taking warfarin and comes in today for a routine follow up visit.  Positive risk factors for bleeding include history of CVA/TIA and presence of serious comorbidities.  Negative risk factors for bleeding include an age less than 51 years old.  The bleeding index is 'intermediate risk'.  Positive CHADS2 values include History of HTN, History of Diabetes, and Prior Stroke/CVA/TIA.  Negative CHADS2 values include Age > 38 years old.  The start date was 11/14/2003.  His last INR was 3.5 RATIO.  Anticoagulation responsible provider: Clifton James MD, Cristal Deer.  INR POC: [id number].  Cuvette Lot#: [id number].  Exp: 09/2011.    Anticoagulation Management Assessment/Plan:      The patient's current anticoagulation dose is Coumadin 7.5 mg tabs: as directed.  The target INR is 2 - 3.  The next INR is due 08/26/2010.  Anticoagulation instructions were given to patient.  Results were reviewed/authorized by Cloyde Reams, RN, BSN.  He was notified by Cloyde Reams RN.         Prior Anticoagulation Instructions: INR 2.4 Continue 7.5mg  everyday except 3.75mg s on Mondays and Fridays. Recheck in 4 weeks.   Current Anticoagulation Instructions: INR 2.5  Continue  on same dosage 7.5mg  daily except 3.75mg  on Mondays and Fridays.  Recheck in 4 weeks.

## 2011-01-06 NOTE — Medication Information (Signed)
Summary: rov/tm  Anticoagulant Therapy  Managed by: Eda Keys, PharmD Referring MD: Charlton Haws MD PCP: Candis Schatz Supervising MD: Clifton James MD, Cristal Deer Indication 1: Aortic Valve Replacement (ICD-V43.3) Indication 2: Aortic Valve Disorder (ICD-424.1) Lab Used: [facility] Site: [facility] INR POC 2.3 INR RANGE 2 - 3  Dietary changes: no    Health status changes: no    Bleeding/hemorrhagic complications: no    Recent/future hospitalizations: no    Any changes in medication regimen? no    Recent/future dental: no  Any missed doses?: no       Is patient compliant with meds? yes       Allergies: No Known Drug Allergies  Anticoagulation Management History:      The patient is taking warfarin and comes in today for a routine follow up visit.  Positive risk factors for bleeding include history of CVA/TIA and presence of serious comorbidities.  Negative risk factors for bleeding include an age less than 59 years old.  The bleeding index is 'intermediate risk'.  Positive CHADS2 values include History of HTN, History of Diabetes, and Prior Stroke/CVA/TIA.  Negative CHADS2 values include Age > 89 years old.  The start date was 11/14/2003.  His last INR was 3.5 RATIO.  Anticoagulation responsible provider: Clifton James MD, Cristal Deer.  INR POC: 2.3.  Cuvette Lot#: [id number].  Exp: 04/2011.    Anticoagulation Management Assessment/Plan:      The patient's current anticoagulation dose is Coumadin 7.5 mg tabs: as directed.  The target INR is 2 - 3.  The next INR is due 03/04/2010.  Anticoagulation instructions were given to patient.  Results were reviewed/authorized by Eda Keys, PharmD.  He was notified by Eda Keys.         Prior Anticoagulation Instructions: INR 2.3 7.5mg s daily except 3.75mg s on Mondays and Fridays. Recheck in 4 weeks.   Current Anticoagulation Instructions: INR 2.3  Continue taking 1/2 tablet on Monday and Friday and 1 tablet all other  days.  Return to clinic in 4 weeks.

## 2011-01-06 NOTE — Medication Information (Signed)
Summary: rov/sp  Anticoagulant Therapy  Managed by: Bethena Midget, RN, BSN Referring MD: Charlton Haws MD PCP: Candis Schatz Supervising MD: Daleen Squibb MD, Maisie Fus Indication 1: Aortic Valve Replacement (ICD-V43.3) Indication 2: Aortic Valve Disorder (ICD-424.1) Lab Used: [facility] Site: [facility] INR POC 2.4 INR RANGE 2 - 3  Dietary changes: no    Health status changes: no    Bleeding/hemorrhagic complications: no    Recent/future hospitalizations: no    Any changes in medication regimen? no    Recent/future dental: no  Any missed doses?: no       Is patient compliant with meds? yes      Comments: Possible need for dental implants, will contact Dr Eden Emms if he needs to stop coumadin for any amount of time.   Allergies: No Known Drug Allergies  Anticoagulation Management History:      The patient is taking warfarin and comes in today for a routine follow up visit.  Positive risk factors for bleeding include history of CVA/TIA and presence of serious comorbidities.  Negative risk factors for bleeding include an age less than 36 years old.  The bleeding index is 'intermediate risk'.  Positive CHADS2 values include History of HTN, History of Diabetes, and Prior Stroke/CVA/TIA.  Negative CHADS2 values include Age > 68 years old.  The start date was 11/14/2003.  His last INR was 3.5 RATIO.  Anticoagulation responsible provider: Daleen Squibb MD, Maisie Fus.  INR POC: 2.4.  Cuvette Lot#: [id number].  Exp: 09/2011.    Anticoagulation Management Assessment/Plan:      The patient's current anticoagulation dose is Coumadin 7.5 mg tabs: as directed.  The target INR is 2 - 3.  The next INR is due 07/30/2010.  Anticoagulation instructions were given to patient.  Results were reviewed/authorized by Bethena Midget, RN, BSN.  He was notified by Bethena Midget, RN, BSN.         Prior Anticoagulation Instructions: INR 2.6  Continue same dose of 1 tablet every day except 1/2 tablet on Monday and Friday.    Current Anticoagulation Instructions: INR 2.4 Continue 7.5mg  everyday except 3.75mg s on Mondays and Fridays. Recheck in 4 weeks.

## 2011-01-06 NOTE — Medication Information (Signed)
Summary: rov/tm  Anticoagulant Therapy  Managed by: Bethena Midget, RN, BSN Referring MD: Charlton Haws MD PCP: Candis Schatz Supervising MD: Graciela Husbands MD, Viviann Spare Indication 1: Aortic Valve Replacement (ICD-V43.3) Indication 2: Aortic Valve Disorder (ICD-424.1) Lab Used: [facility] Site: [facility] INR POC 2.3 INR RANGE 2 - 3  Dietary changes: no    Health status changes: no    Bleeding/hemorrhagic complications: no    Recent/future hospitalizations: no    Any changes in medication regimen? no    Recent/future dental: no  Any missed doses?: no       Is patient compliant with meds? yes       Allergies: No Known Drug Allergies  Anticoagulation Management History:      The patient is taking warfarin and comes in today for a routine follow up visit.  Positive risk factors for bleeding include history of CVA/TIA and presence of serious comorbidities.  Negative risk factors for bleeding include an age less than 54 years old.  The bleeding index is 'intermediate risk'.  Positive CHADS2 values include History of HTN, History of Diabetes, and Prior Stroke/CVA/TIA.  Negative CHADS2 values include Age > 73 years old.  The start date was 11/14/2003.  His last INR was 3.5 RATIO.  Anticoagulation responsible provider: Graciela Husbands MD, Viviann Spare.  INR POC: 2.3.  Cuvette Lot#: [id number].  Exp: 03/2011.    Anticoagulation Management Assessment/Plan:      The patient's current anticoagulation dose is Coumadin 7.5 mg tabs: as directed.  The target INR is 2 - 3.  The next INR is due 02/04/2010.  Anticoagulation instructions were given to patient.  Results were reviewed/authorized by Bethena Midget, RN, BSN.  He was notified by Bethena Midget, RN, BSN.         Prior Anticoagulation Instructions: INR 1.8 Today take 7.5mg s then resume 7.5mg s daily except 3.75mg s on Mondays and Fridays. Recheck in 4 weeks.   Current Anticoagulation Instructions: INR 2.3 7.5mg s daily except 3.75mg s on Mondays and Fridays. Recheck  in 4 weeks.

## 2011-01-06 NOTE — Procedures (Signed)
NAME:  SEAB, AXEL NO.:  [id number]  MEDICAL RECORD NO.:  [id number]          PATIENT TYPE:  AMB  LOCATION:  SDS                          FACILITY:  [facility]  PHYSICIAN:  Veverly Fells. Excell Seltzer, MD  DATE OF BIRTH:  August 10, 1946  DATE OF PROCEDURE: DATE OF DISCHARGE:  12/19/2010                           CARDIAC CATHETERIZATION   Mr. Mainer is a 65 year old gentleman with lower extremity peripheral arterial disease.  He has had previous left iliac stenting in 2008.  He presented with progressive claudication symptoms and was referred for angiography.  His predominant symptoms are in his left leg, but he also has mild right leg claudication.  Risks and indication of the procedure were reviewed with the patient. Informed consent was obtained.  The right groin was prepped, draped and anesthetized with 1% lidocaine.  Using the modified Seldinger technique, a 5-French sheath was placed in the right femoral artery.  There was severe calcification of the artery, making access moderately difficult. A Wholey wire was used and a 5-French pigtail catheter was advanced into the suprarenal abdominal aorta and aortogram was performed with power injection under digital subtraction.  The pigtail catheter was then brought back to the distal aortic bifurcation and oblique views of the iliac arteries were performed under digital subtraction.  Bilateral runoff to the feet was then performed.  The patient tolerated the procedure well.  There were no immediate complications.  PROCEDURAL FINDINGS:  The abdominal aorta is patent.  There is no significant distal aortic stenosis.  The bilateral renal arteries are patent.  The right renal artery is widely patent.  The left renal artery has 50-75% proximal stenosis.  The SMA is patent.  Right leg:  The right common iliac is patent, but there is a 60% common iliac stenosis with heavy calcification.  Above the bifurcation of the internal  and external iliacs, the internal iliac on the right is patent, the external iliac is patent with nonobstructive disease.  There are exophytic plaques in the external iliac, the right common femoral also has calcified plaques without evidence of high-grade stenosis.  The right deep femoral artery has mild ostial stenosis.  The superficial femoral is patent with globular plaques in the midportion of the vessel. The popliteal artery is patent.  There is two-vessel runoff to the right foot through the anterior and posterior tibials.  The peroneal artery occludes just above the ankle.  Left leg:  The left common iliac is patent.  There is a patent stent at the junction of the common and external iliac arteries.  Proximal to the stent, there is mild stenosis present.  The internal iliac is occluded and fills from collaterals.  The distal external iliac artery has severe exophytic plaques with significant associated stenosis by angiography. The common femoral was heavily calcified and also has severe exophytic plaque involving the just above the bifurcation of the deep and superficial femoral arteries.  The plaque that extends down into the proximal superficial femoral artery.  The remaining portions of the SFA and popliteal arteries are patent.  There is two-vessel runoff to the foot via the anterior and posterior  tibial arteries.  FINAL ASSESSMENT: 1. Moderately severe right common iliac stenosis with right common     femoral stenosis. 2. Severe left external iliac and left common femoral artery stenosis.  RECOMMENDATIONS:  I am going to review the patient's films and clinical situation with Dr. Darrick Penna who is taking care of him in the past.  The question is whether to treat with a combined revascularization procedure that would include femoral endarterectomy versus ongoing medical therapy.     Veverly Fells. Excell Seltzer, MD     MDC/MEDQ  D:  12/19/2010  T:  12/20/2010  Job:   [id number]  Electronically Signed by Tonny Bollman MD on 01/06/2011 04:49:34 AM

## 2011-01-06 NOTE — Medication Information (Signed)
Summary: rov/tm  Anticoagulant Therapy  Managed by: Weston Brass, PharmD Referring MD: Charlton Haws MD PCP: Candis Schatz Supervising MD: Riley Kill MD, Maisie Fus Indication 1: Aortic Valve Replacement (ICD-V43.3) Indication 2: Aortic Valve Disorder (ICD-424.1) Lab Used: [facility] Site: [facility] INR POC 2.5 INR RANGE 2 - 3  Dietary changes: no    Health status changes: no    Bleeding/hemorrhagic complications: no    Recent/future hospitalizations: no    Any changes in medication regimen? no    Recent/future dental: no  Any missed doses?: no       Is patient compliant with meds? yes       Allergies: No Known Drug Allergies  Anticoagulation Management History:      The patient is taking warfarin and comes in today for a routine follow up visit.  Positive risk factors for bleeding include history of CVA/TIA and presence of serious comorbidities.  Negative risk factors for bleeding include an age less than 45 years old.  The bleeding index is 'intermediate risk'.  Positive CHADS2 values include History of HTN, History of Diabetes, and Prior Stroke/CVA/TIA.  Negative CHADS2 values include Age > 16 years old.  The start date was 11/14/2003.  His last INR was 3.5 RATIO.  Anticoagulation responsible provider: Riley Kill MD, Maisie Fus.  INR POC: 2.5.  Cuvette Lot#: [id number].  Exp: 07/2011.    Anticoagulation Management Assessment/Plan:      The patient's current anticoagulation dose is Coumadin 7.5 mg tabs: as directed.  The target INR is 2 - 3.  The next INR is due 06/04/2010.  Anticoagulation instructions were given to patient.  Results were reviewed/authorized by Weston Brass, PharmD.  He was notified by Weston Brass PharmD.         Prior Anticoagulation Instructions: INR 2.8 Continue 7.5mg s daily except 3.75mg s on Mondays and Fridays. Recheck in 4 weeks.   Current Anticoagulation Instructions: INR 2.5  Continue same dose of 1 tablet every day except 1/2 tablet on Monday and Friday

## 2011-01-06 NOTE — Medication Information (Signed)
Summary: rov/tm  Anticoagulant Therapy  Managed by: Bethena Midget, RN, BSN Referring MD: Charlton Haws MD PCP: Candis Schatz Supervising MD: Juanda Chance MD, Keilan Nichol Indication 1: Aortic Valve Replacement (ICD-V43.3) Indication 2: Aortic Valve Disorder (ICD-424.1) Lab Used: [facility] Site: [facility] INR POC 1.8 INR RANGE 2 - 3  Dietary changes: no    Health status changes: no    Bleeding/hemorrhagic complications: no    Recent/future hospitalizations: no    Any changes in medication regimen? no    Recent/future dental: no  Any missed doses?: yes     Details: couple doses missed during Holidays.   Is patient compliant with meds? yes       Allergies: No Known Drug Allergies  Anticoagulation Management History:      Positive risk factors for bleeding include history of CVA/TIA and presence of serious comorbidities.  Negative risk factors for bleeding include an age less than 10 years old.  The bleeding index is 'intermediate risk'.  Positive CHADS2 values include History of HTN, History of Diabetes, and Prior Stroke/CVA/TIA.  Negative CHADS2 values include Age > 60 years old.  The start date was 11/14/2003.  His last INR was 3.5 RATIO.  Anticoagulation responsible provider: Juanda Chance MD, Smitty Cords.  INR POC: 1.8.  Exp: 01/2011.    Anticoagulation Management Assessment/Plan:      The patient's current anticoagulation dose is Coumadin 7.5 mg tabs: as directed.  The target INR is 2 - 3.  The next INR is due 01/07/2010.  Anticoagulation instructions were given to patient.  Results were reviewed/authorized by Bethena Midget, RN, BSN.  He was notified by Bethena Midget, RN, BSN.         Prior Anticoagulation Instructions: INR 1.8 Today take extra 1/2 tablet then resume 1 tablet everyday except 1/2 tablet on Mondays  and Fridays. Recheck in 4 weeks.   Current Anticoagulation Instructions: INR 1.8 Today take 7.5mg s then resume 7.5mg s daily except 3.75mg s on Mondays and Fridays. Recheck in 4 weeks.   Prescriptions: COUMADIN 7.5 MG TABS (WARFARIN SODIUM) as directed  #30 x 3   Entered by:   Bethena Midget, RN, BSN   Authorized by:   Colon Branch, MD, [facility]   Signed by:   Bethena Midget, RN, BSN on 12/10/2009   Method used:   Electronically to        [facility]  [phone]* (retail)       [address] [place], [place]  [address]       Ph: [phone] or [phone]       Fax: [phone]   RxID:   [id number]

## 2011-01-06 NOTE — Medication Information (Signed)
Summary: rov/ewj  Anticoagulant Therapy  Managed by: Bethena Midget, RN, BSN Referring MD: Charlton Haws MD PCP: Candis Schatz Supervising MD: Daleen Squibb MD, Maisie Fus Indication 1: Aortic Valve Replacement (ICD-V43.3) Indication 2: Aortic Valve Disorder (ICD-424.1) Lab Used: [facility] Site: [address] INR POC 2.2 INR RANGE 2 - 3  Dietary changes: no    Health status changes: no    Bleeding/hemorrhagic complications: no    Recent/future hospitalizations: no    Any changes in medication regimen? no    Recent/future dental: yes     Details: Extraction, 2 implants and bone graft to be done.   Any missed doses?: no       Is patient compliant with meds? yes       Allergies: No Known Drug Allergies  Anticoagulation Management History:      The patient is taking warfarin and comes in today for a routine follow up visit.  Positive risk factors for bleeding include history of CVA/TIA and presence of serious comorbidities.  Negative risk factors for bleeding include an age less than 18 years old.  The bleeding index is 'intermediate risk'.  Positive CHADS2 values include History of HTN, History of Diabetes, and Prior Stroke/CVA/TIA.  Negative CHADS2 values include Age > 52 years old.  The start date was 11/14/2003.  His last INR was 3.5 RATIO.  Anticoagulation responsible provider: Daleen Squibb MD, Maisie Fus.  INR POC: 2.2.  Cuvette Lot#: [id number].  Exp: 10/2011.    Anticoagulation Management Assessment/Plan:      The patient's current anticoagulation dose is Coumadin 7.5 mg tabs: as directed.  The target INR is 2 - 3.  The next INR is due 09/23/2010.  Anticoagulation instructions were given to patient.  Results were reviewed/authorized by Bethena Midget, RN, BSN.  He was notified by Bethena Midget, RN, BSN.         Prior Anticoagulation Instructions: INR 2.5  Continue on same dosage 7.5mg  daily except 3.75mg  on Mondays and Fridays.  Recheck in 4 weeks.    Current Anticoagulation Instructions: INR  2.2 Continue 7.5mg s daily except 3.75mg s on Mondays and Fridays. Recheck in 4 weeks.

## 2011-01-08 NOTE — Progress Notes (Signed)
Summary: blood flow  Phone Note Call from Patient Call back at Home Phone [phone]   Caller: Patient Reason for Call: Talk to Nurse Summary of Call: pt returning you call  concerning blood flow Initial call taken by: Roe Coombs,  November 26, 2010 2:51 PM  Follow-up for Phone Call        I spoke with the pt and made him aware that Dr Excell Seltzer reviewed his records from [facility].  Dr Excell Seltzer does recommend that the pt proceed with Lower extremity angiogram.  The pt would like to get this scheduled in January.  Follow-up by: Julieta Gutting, RN, BSN,  November 26, 2010 3:14 PM  Additional Follow-up for Phone Call Additional follow up Details #1::        Per Dr Excell Seltzer hold coumadin 5 days, bridge with Lovenox 2 days prior to procedure, last dose the night before procedure.  Bridge until INR therapeutic after procedure.   I spoke with the pt and he is currently out of town and will return next week.  The pt would like to get this procedure scheduled on 12/17/10. I will get things scheduled and make the coumadin clinic aware.  I will contact the pt next week with procedure information.  Julieta Gutting, RN, BSN  November 28, 2010 1:28 PM    Additional Follow-up for Phone Call Additional follow up Details #2::    I spoke with the pt and gave him instructions over the phone for PV procedure on 12/17/10.  I made the pt aware that his last dose of Coumadin should be on 12/11/10 and that the Coumadin Clinic should contact him with further instructions about Lovenox. Julieta Gutting, RN, BSN  December 10, 2010 12:44 PM  Written instructions for PV procedure given to the Coumadin Clinic to give to the pt. Julieta Gutting, RN, BSN  December 11, 2010 9:25 AM  Pt has appt with Coumadin Clinic on 1/6 to set up Lovenox bridge.  Weston Brass PharmD  December 11, 2010 5:06 PM

## 2011-01-08 NOTE — Progress Notes (Signed)
Summary: pt states he's returning dr Tijana Walder's call  Phone Note Call from Patient   Caller: Patient [phone] Reason for Call: Talk to Nurse Summary of Call: pt said he received a call from dr Mona Ayars yesterday and he was returning his call Initial call taken by: Glynda Jaeger,  January 01, 2011 2:48 PM  Follow-up for Phone Call        Spoke with patient. I have reviewed angio films with Dr Darrick Penna who will see him back in consultation for possible femoral endarterectomy. He will call for appt as he is an established patient. Follow-up by: Norva Karvonen, MD,  January 01, 2011 5:24 PM

## 2011-01-08 NOTE — Assessment & Plan Note (Signed)
Summary: npv   Visit Type:  Initial Consult Primary Provider:  Candis Schatz  CC:  New PV evaluation per Dr. Eden Emms.  History of Present Illness: 65 year-old male patient referred for initial evaluation of lower extremity PAD. He underwent bilateral iliac stenting in 2008. He has been followed by the [facility] but wishes to change providers to our group. He is regularly followed by Dr Eden Emms.  The patient last underwent ABI's in March and these were approximately 0.7 bilaterally. An iliac duplex exam was done and this was limited by overlying bowel gas.  He complains of bilateral calf cramping, left worse than right, with working out or walking up an incline. He also complains of left thigh pain with walking, but is pedominately limited by his left leg. He works out on a treadmill and can walk for 12-15 minutes before pain causes him to stop. He feels the symptoms have been slowly progressive over the past 18 months. He denies rest pain or ulceration.     Current Medications (verified): 1)  Mavik 4 Mg Tabs (Trandolapril) .... Take Two By Mouth Once Daily 2)  Toprol Xl 50 Mg Xr24h-Tab (Metoprolol Succinate) .... Take One By Mouth Once Daily 3)  Lipitor 40 Mg Tabs (Atorvastatin Calcium) .... Take One Tablet By Mouth Daily. 4)  Metformin Hcl 500 Mg Tabs (Metformin Hcl) .... Take Two Tabs in The Morning and One in At Bedtime 5)  Coumadin 7.5 Mg Tabs (Warfarin Sodium) .... As Directed 6)  Hydrochlorothiazide 12.5 Mg Tabs (Hydrochlorothiazide) .... Take One Tablet By Mouth Daily.  Allergies (verified): No Known Drug Allergies  Past History:  Past medical history reviewed for relevance to current acute and chronic problems.  Past Medical History: Current Problems:  IMMUNE THROMBOCYTOPENIC PURPURA (ICD-287.31) DIABETES MELLITUS (ICD-250.00) AORTIC STENOSIS (ICD-424.1) CLAUDICATION (ICD-443.9) - hx bilateral iliac stenting HYPERTENSION (ICD-401.9) HYPERCHOLESTEROLEMIA  (ICD-272.0) COUMADIN THERAPY (ICD-V58.61) DIVERTICULITIS, COLON (ICD-562.11) COLONIC POLYPS, HX OF (ICD-V12.72)  Review of Systems       Negative except as per HPI   Vital Signs:  Patient profile:   65 year old male Height:      68 inches Weight:      178.25 pounds BMI:     27.20 Pulse rate:   68 / minute Pulse rhythm:   regular Resp:     18 per minute BP sitting:   158 / 80  (left arm) Cuff size:   large  Vitals Entered By: Vikki Ports (November 17, 2010 11:18 AM)  Serial Vital Signs/Assessments:  Time      Position  BP       Pulse  Resp  Temp     By           R Arm     146/80                         Vikki Ports   Physical Exam  General:  Pt is alert and oriented, in no acute distress. HEENT: normal Neck: normal carotid upstrokes without bruits, JVP normal Lungs: CTA CV: RRR without murmur or gallop Abd: soft, NT, positive BS, no bruit, no organomegaly Ext: no clubbing, cyanosis, or edema.  Femoral 2+ right, 1+ left, pedals 2+ right, 1+ left Skin: warm and dry without rash    Impression & Recommendations:  Problem # 1:  CLAUDICATION (ICD-443.9) Pt with symptoms consistent with left leg claudication, highly typical of a vascular origin with exertional symptoms worse on an  incline. He reports progressive symptoms. His pulse exam is consistent with symptoms worse on the left. We have requested most recent ABI data from [facility] (done about 6-8 weeks ago per patient). I have recommended angiographic assessment as I suspect the patient has recurrent inflow disease. Risks, indications, and alternatives to angiography and PTA were reviewed with the patient. Will contact him after [facility] records are reviewed and tentatively plan on an angiogram +/- PTA thereafter.

## 2011-01-08 NOTE — Medication Information (Signed)
Summary: ROV  Anticoagulant Therapy  Managed by: Weston Brass, PharmD Referring MD: Charlton Haws MD PCP: Candis Schatz Supervising MD: Eden Emms MD, Theron Arista Indication 1: Aortic Valve Replacement (ICD-V43.3) Indication 2: Aortic Valve Disorder (ICD-424.1) Lab Used: [facility]  Site: [facility] INR POC 1.9 INR RANGE 2 - 3  Dietary changes: no    Health status changes: no    Bleeding/hemorrhagic complications: no    Recent/future hospitalizations: no    Any changes in medication regimen? no    Recent/future dental: no  Any missed doses?: no       Is patient compliant with meds? yes       Allergies: No Known Drug Allergies  Anticoagulation Management History:      The patient is taking warfarin and comes in today for a routine follow up visit.  Positive risk factors for bleeding include an age of 4 years or older, history of CVA/TIA, and presence of serious comorbidities.  The bleeding index is 'high risk'.  Positive CHADS2 values include History of HTN, History of Diabetes, and Prior Stroke/CVA/TIA.  Negative CHADS2 values include Age > 49 years old.  The start date was 11/14/2003.  His last INR was 3.5 RATIO.  Anticoagulation responsible provider: Eden Emms MD, Theron Arista.  INR POC: 1.9.  Cuvette Lot#: [id number].  Exp: 12/2011.    Anticoagulation Management Assessment/Plan:      The patient's current anticoagulation dose is Coumadin 7.5 mg tabs: as directed.  The target INR is 2 - 3.  The next INR is due 01/20/2011.  Anticoagulation instructions were given to patient.  Results were reviewed/authorized by Weston Brass, PharmD.  He was notified by Stephannie Peters, PharmD Candidate .         Prior Anticoagulation Instructions: INR:  1.8 (goal 2-3)  Your INR is low today.  Continue your lovenox injection daily until you run out.  Take 1 1/2  tablet of your coumadin tonight then resume your normal schedule of 1 tablet everyday except 1/2 a tablet on Mondays and Fridays  Return to clinic in 1  week for another INR check.      Current Anticoagulation Instructions: INR 1.9  Coumadin 7.5 mg tablets - Take an additional half tablet today, then take a whole tablet every day except 1/2 tablet on Mondays.

## 2011-01-08 NOTE — Medication Information (Signed)
Summary: rov-pt needs lovenox bridge  Anticoagulant Therapy  Managed by: Weston Brass, PharmD Referring MD: Charlton Haws MD PCP: Candis Schatz Supervising MD: Tenny Craw MD, Gunnar Fusi Indication 1: Aortic Valve Replacement (ICD-V43.3) Indication 2: Aortic Valve Disorder (ICD-424.1) Lab Used: [facility] Site: [facility] INR POC 2.7 INR RANGE 2 - 3  Dietary changes: no    Health status changes: no    Bleeding/hemorrhagic complications: no    Recent/future hospitalizations: yes       Details: pt pending PV angiogram on 1/11 with Dr. Excell Seltzer.  Needs Lovenox bridge  Any changes in medication regimen? no    Recent/future dental: no  Any missed doses?: no       Is patient compliant with meds? yes      Comments: Pt's weight- 77 kg.  Allergies: No Known Drug Allergies  Anticoagulation Management History:      The patient is taking warfarin and comes in today for a routine follow up visit.  Positive risk factors for bleeding include an age of 45 years or older, history of CVA/TIA, and presence of serious comorbidities.  The bleeding index is 'high risk'.  Positive CHADS2 values include History of HTN, History of Diabetes, and Prior Stroke/CVA/TIA.  Negative CHADS2 values include Age > 27 years old.  The start date was 11/14/2003.  His last INR was 3.5 RATIO.  Anticoagulation responsible provider: Tenny Craw MD, Gunnar Fusi.  INR POC: 2.7.  Exp: 10/2011.    Anticoagulation Management Assessment/Plan:      The patient's current anticoagulation dose is Coumadin 7.5 mg tabs: as directed.  The target INR is 2 - 3.  The next INR is due 12/22/2010.  Anticoagulation instructions were given to patient.  Results were reviewed/authorized by Weston Brass, PharmD.  He was notified by Weston Brass PharmD.         Prior Anticoagulation Instructions: INR 2.2 Continue 7.5mg s daily except 3.75mg s on Mondays and Fridays. Recheck in 4 weeks.   Current Anticoagulation Instructions: INR 2.7  1/6- Stop Coumadin 1/9-Start  Lovenox 80mg  injection every 12 hours 1/10- Lovenox injection every 12 hours 1/11- Day of Procedure.  Dr. Excell Seltzer will tell you when to restart Coumadin and Lovenox.  Continue Lovenox every 12 hours.  Take 1 1/2 tablets of Coumadin x 2 days then resume normal dose of 1 tablet every day except 1/2 tablet on Monday and Friday.  1/16- Recheck INR  Prescriptions: ENOXAPARIN SODIUM 80 MG/0.8ML SOLN (ENOXAPARIN SODIUM) Inject 1 syringe subcutaneously every 12 hours as directed  #20 syringes x 0   Entered by:   Cloyde Reams RN   Authorized by:   Norva Karvonen, MD   Signed by:   Cloyde Reams RN on 12/12/2010   Method used:   [facility]ron[facility] to        [facility]  [phone]* (retail)       [address] [place], [place]  [address]       Ph: [phone] or [phone]       Fax: [phone]   RxID:   [id number]   Appended Document: rov-pt needs lovenox bridge Pt's PV procedure was rescheduled from 1/11 to 12/19/10.  Dr Excell Seltzer spoke with this pt and instructed him to take his last dose of Lovenox on Thursday morning.

## 2011-01-08 NOTE — Medication Information (Signed)
Summary: ROV/tp  Anticoagulant Therapy  Managed by: Geoffry Paradise, PharmD Referring MD: Charlton Haws MD PCP: Candis Schatz Supervising MD: Riley Kill MD, Maisie Fus Indication 1: Aortic Valve Replacement (ICD-V43.3) Indication 2: Aortic Valve Disorder (ICD-424.1) Lab Used: [facility] Site: [facility] INR POC 1.8 INR RANGE 2 - 3  Dietary changes: no    Health status changes: no    Bleeding/hemorrhagic complications: no    Recent/future hospitalizations: no    Any changes in medication regimen? yes       Comments: Lovenox started on the 9th, coumadin stopped on the 6th and restarted on the 14th.    Allergies: No Known Drug Allergies  Anticoagulation Management History:      Positive risk factors for bleeding include an age of 63 years or older, history of CVA/TIA, and presence of serious comorbidities.  The bleeding index is 'high risk'.  Positive CHADS2 values include History of HTN, History of Diabetes, and Prior Stroke/CVA/TIA.  Negative CHADS2 values include Age > 51 years old.  The start date was 11/14/2003.  His last INR was 3.5 RATIO.  Anticoagulation responsible provider: Riley Kill MD, Maisie Fus.  INR POC: 1.8.  Cuvette Lot#: [id number].  Exp: 12/2011.    Anticoagulation Management Assessment/Plan:      The patient's current anticoagulation dose is Coumadin 7.5 mg tabs: as directed.  The target INR is 2 - 3.  The next INR is due 12/29/2010.  Anticoagulation instructions were given to patient.  Results were reviewed/authorized by Geoffry Paradise, PharmD.         Prior Anticoagulation Instructions: INR 2.7  1/6- Stop Coumadin 1/9-Start Lovenox 80mg  injection every 12 hours 1/10- Lovenox injection every 12 hours 1/11- Day of Procedure.  Dr. Excell Seltzer will tell you when to restart Coumadin and Lovenox.  Continue Lovenox every 12 hours.  Take 1 1/2 tablets of Coumadin x 2 days then resume normal dose of 1 tablet every day except 1/2 tablet on Monday and Friday.  1/16- Recheck INR    Current Anticoagulation Instructions: INR:  1.8 (goal 2-3)  Your INR is low today.  Continue your lovenox injection daily until you run out.  Take 1 1/2  tablet of your coumadin tonight then resume your normal schedule of 1 tablet everyday except 1/2 a tablet on Mondays and Fridays  Return to clinic in 1 week for another INR check.

## 2011-01-08 NOTE — Letter (Signed)
Summary: Peripheral Vascular  [facility], Main Office  [address]   [place], [place] [address]   Phone: [phone]  Fax: [phone]     12/10/2010 MRN: [id number]  [name] [name] [address] [place], [place]  [address]  Dear [name],   You are scheduled for Peripheral Vascular Angiogram on Wednesday December 17, 2010 with Dr. Excell Seltzer.  Please arrive at the [facility] at 11:00      a.m. on the day of your procedure.  1. DIET     _X___ Nothing to eat after midnight. You may have clear liquid breakfast, then nothing to drink after 7:00 AM.  Clear liquids include: water, broth, Sprite, Ginger Ale, black coffee, tea (no sugar), cranberry/grape/apple juice, jello (not red).  2. __X___ DO NOT TAKE these medications before your procedure:     Do not take Metformin the night before, morning of, or 48 hours after procedure.  Drink plenty of water after procedure. Your last dose of coumadin should be on 12/11/2010.  Please follow further instructions given to you by the Coumadin Clinic.           __x__ YOU MAY TAKE ALL of your remaining medications with a small amount of water.  3. Possible one night stay if any complications - bring personal belongings (i.e. toothpaste, toothbrush, etc.)---this is an outpatient procedure  4. Bring a current list of your medications and current insurance cards.  5. Must have a responsible person to drive you home.   6. Someone must be with you for the first 24 hours after you arrive home.  7. Please wear clothes that are easy to get on and off and wear slip-on shoes.  *Special note: Every effort is made to have your procedure done on time.  Occasionally there are emergencies that present themselves at the hospital that may cause delays.  Please be patient if a delay does occur.  If you have any questions after you get home, please call the office at the number listed above.   Julieta Gutting, RN, BSN

## 2011-01-15 ENCOUNTER — Encounter: Payer: Self-pay | Admitting: Vascular Surgery

## 2011-01-20 ENCOUNTER — Encounter: Payer: Self-pay | Admitting: Cardiovascular Disease

## 2011-01-20 ENCOUNTER — Encounter (INDEPENDENT_AMBULATORY_CARE_PROVIDER_SITE_OTHER): Payer: BC Managed Care – PPO

## 2011-01-20 DIAGNOSIS — I359 Nonrheumatic aortic valve disorder, unspecified: Secondary | ICD-10-CM

## 2011-01-20 DIAGNOSIS — Z7901 Long term (current) use of anticoagulants: Secondary | ICD-10-CM

## 2011-01-20 LAB — CONVERTED CEMR LAB: POC INR: 1.9

## 2011-01-27 DIAGNOSIS — I359 Nonrheumatic aortic valve disorder, unspecified: Secondary | ICD-10-CM

## 2011-01-27 DIAGNOSIS — G459 Transient cerebral ischemic attack, unspecified: Secondary | ICD-10-CM

## 2011-01-28 NOTE — Medication Information (Signed)
Summary: rov/kh  Anticoagulant Therapy  Managed by: Tammy Sours, PharmD Referring MD: Charlton Haws MD PCP: Candis Schatz Supervising MD: Eden Emms MD, Theron Arista Indication 1: Aortic Valve Replacement (ICD-V43.3) Indication 2: Aortic Valve Disorder (ICD-424.1) Lab Used: [facility] Site: [facility] INR POC 1.9 INR RANGE 2 - 3  Dietary changes: no    Health status changes: no    Bleeding/hemorrhagic complications: no    Recent/future hospitalizations: no    Any changes in medication regimen? no    Recent/future dental: no  Any missed doses?: yes     Details: about 10 days ago missed one dose when traveling  Is patient compliant with meds? yes       Allergies: No Known Drug Allergies  Anticoagulation Management History:      The patient is taking warfarin and comes in today for a routine follow up visit.  Positive risk factors for bleeding include an age of 66 years or older, history of CVA/TIA, and presence of serious comorbidities.  The bleeding index is 'high risk'.  Positive CHADS2 values include History of HTN, History of Diabetes, and Prior Stroke/CVA/TIA.  Negative CHADS2 values include Age > 66 years old.  The start date was 11/14/2003.  His last INR was 3.5 RATIO.  Anticoagulation responsible provider: Eden Emms MD, Theron Arista.  INR POC: 1.9.  Cuvette Lot#: [id number].  Exp: 12/2011.    Anticoagulation Management Assessment/Plan:      The patient's current anticoagulation dose is Coumadin 7.5 mg tabs: as directed.  The target INR is 2 - 3.  The next INR is due 02/03/2011.  Anticoagulation instructions were given to patient.  Results were reviewed/authorized by Tammy Sours, PharmD.         Prior Anticoagulation Instructions: INR 1.9  Coumadin 7.5 mg tablets - Take an additional half tablet today, then take a whole tablet every day except 1/2 tablet on Mondays.   Current Anticoagulation Instructions: INR 1.9  Take an extra 1/2 tablet today. Then begin taking 1 tablet  daily. Recheck INR in 2 weeks.

## 2011-01-29 ENCOUNTER — Encounter: Payer: Self-pay | Admitting: Vascular Surgery

## 2011-01-30 ENCOUNTER — Encounter: Payer: BC Managed Care – PPO | Admitting: Vascular Surgery

## 2011-01-30 ENCOUNTER — Telehealth: Payer: Self-pay | Admitting: Cardiovascular Disease

## 2011-02-03 NOTE — Progress Notes (Signed)
Summary: referral for vascular surgeon  Phone Note Call from Patient Call back at Home Phone [phone]   Caller: Patient Reason for Call: Referral Summary of Call: vascular surgeon.  Initial call taken by: Lorne Skeens,  January 30, 2011 11:52 AM  Follow-up for Phone Call        I spoke with the pt and he is very upset with [facility].  The pt said he had an appt yesterday with Dr Darrick Penna and they called and cancelled appt because the MDs schedule was overbooked.  They told the pt to come into the office today to see Dr Darrick Penna and today Dr Darrick Penna was not even in the office.  They told the pt he could see Dr Imogene Burn today.  The pt now cannot get an appt with Dr Darrick Penna until 02/26/11.  The pt said Dr Darrick Penna is in the office only on Thursday and next week the MD is out of office on vacation and then the next 2 Thursdays the pt is out of town on business. The pt would like to know if Dr Excell Seltzer could recommend another physician at [facility].  Julieta Gutting, RN, BSN  January 30, 2011 4:39 PM  I spoke with Dr Excell Seltzer and he does not recommend having the pt see one of the other physicians.  Dr Excell Seltzer said he had already reviewed the pt's films with Dr Darrick Penna and discussed surgery.  Pt aware.  Follow-up by: Julieta Gutting, RN, BSN,  January 30, 2011 6:01 PM

## 2011-02-03 NOTE — Progress Notes (Signed)
Summary: [facility]: Office Visit  [facility]: Office Visit   Imported By: Earl Many 01/26/2011 17:38:58  _____________________________________________________________________  External Attachment:    Type:   Image     Comment:   External Document

## 2011-02-12 NOTE — Cardiovascular Report (Signed)
Summary: Pre-Cath Orders  Pre-Cath Orders   Imported By: Marylou Mccoy 02/02/2011 15:35:41  _____________________________________________________________________  External Attachment:    Type:   Image     Comment:   External Document

## 2011-02-26 ENCOUNTER — Telehealth: Payer: Self-pay | Admitting: Pharmacist

## 2011-02-26 ENCOUNTER — Encounter (INDEPENDENT_AMBULATORY_CARE_PROVIDER_SITE_OTHER): Payer: BC Managed Care – PPO | Admitting: Vascular Surgery

## 2011-02-26 DIAGNOSIS — I359 Nonrheumatic aortic valve disorder, unspecified: Secondary | ICD-10-CM

## 2011-02-26 DIAGNOSIS — I7092 Chronic total occlusion of artery of the extremities: Secondary | ICD-10-CM

## 2011-02-26 MED ORDER — ENOXAPARIN SODIUM 80 MG/0.8ML ~~LOC~~ SOLN: 80.0000 mg | Freq: Two times a day (BID) | SUBCUTANEOUS | Status: DC

## 2011-02-26 NOTE — Telephone Encounter (Signed)
Dr. Excell Seltzer received call from Dr. Darrick Penna.  Pt has been set up for vascular surgery on 3/28.  Will need Lovenox bridge secondary to AVR and hx of TIAs.  Pt given the following instructions:   3/23- Take last dose of Coumadin 3/24- No Coumadin or Lovenox 3/25- Lovenox 80mg  in AM and PM 3/26- Lovenox 80mg  in AM and PM 3/27- Lovenox 80mg  in AM only 3/28- Day of Procedure.  Pt will be in hospital for a few days.  Instructed to follow discharge instructions regarding Coumadin and Lovenox.  4/4- Recheck INR

## 2011-02-27 NOTE — Assessment & Plan Note (Signed)
OFFICE VISIT  RICHARDS, PHERIGO DOB:  1946-10-18                                       02/26/2011 CHART#:[id number]  CHIEF COMPLAINT:  Left leg pain.  HISTORY OF PRESENT ILLNESS:  The patient is a 65 year old male referred by Dr. Tonny Bollman for evaluation of left lower extremity claudication.  The patient previously had angioplasty and stenting of his left external iliac artery in June of 2008.  He had done well and was last seen in March of 2011 and was actually walking about a mile at that time.  However, he states over the past year his walking distance has become progressively shorter and some days he is only able to walk about 20 feet before experiencing left calf claudication.  He denies any rest pain.  He states that his right leg has some mild symptoms but overall is okay.  He has had no nonhealing wounds.  CHRONIC MEDICAL PROBLEMS:  Include diabetes, hypertension, elevated cholesterol, all of which are currently controlled.  He is a former smoker and quit approximately 16 years ago.  PAST MEDICAL HISTORY:  Significant for ITP.  He had a splenectomy for this in the remote past.  PAST SURGICAL HISTORY:  Splenectomy for ITP.  He has also had aortic valve replacement and coronary artery bypass grafting by Dr. Laneta Simmers in 2004, at that time the right greater saphenous vein was found to be small and the left saphenous vein was used for his bypass grafts.  MEDICATIONS:  He is on Coumadin for his artificial valve.  REVIEW OF SYSTEMS:  HEENT:  He has recently had conjunctivitis in the his left eye. PULMONARY:  He denies any shortness of breath. CARDIAC:  He denies any chest pain.  PHYSICAL EXAM:  Vital signs:  Blood pressure is 198/68 in the left arm, heart rate 78 and regular, respirations 20.  HEENT:  Remarkable for some injection of his left conjunctiva.  Mucous membranes are moist.  Head is normocephalic, atraumatic.  Chest:  Clear to  auscultation.  Cardiac: Regular rate and rhythm without murmur.  Neck:  Has no carotid bruits. Abdomen:  Soft, nontender with no masses.  Extremities:  He has 2+ radial pulses bilaterally.  He has a 2+ left femoral pulse.  He has a 1+ right femoral pulse.  He has absent popliteal and pedal pulses bilaterally.  Musculoskeletal:  He has some overlap of the left fifth toe over the dorsum of the left fourth toe.  There are no ulcerations on the feet.  Skin:  Intact.  Neurological:  Shows symmetric upper extremity and lower extremity motor strength which is 5/5.  I reviewed a recent abdominal aortogram and lower extremity runoff by Dr. Excell Seltzer performed on January 13.  This shows a 75%-80% stenosis of the right common iliac artery with an exophytic calcified plaque.  There are multiple areas of exophytic calcified plaque throughout both iliac systems.  The left external iliac artery stent is patent.  He also has a right common iliac artery stent which is patent.  There is some exophytic calcified plaque below the stent in the left external iliac artery.  However, on his pulse exam he has a very easily palpable left femoral pulse.  I do not believe this is flow limiting.  He does have a large calcified plaque at the femoral bifurcation on the left side.  The left common femoral artery is patent.  He has a patent left superficial femoral artery with 3 vessel runoff to the left foot.  Similar findings are found in the right lower extremity.  I believe the best option at this point would be a left femoral endarterectomy for the patient.  This should give him significant improvement.  I did discuss with him today that there is some calcified plaque in the external iliac artery above this but hopefully this would not be flow limiting and should not impede him from having significant improvement in his walking distance.  Risks, benefits, possible complications and procedure details were explained to  the patient including but not limited to bleeding, infection.  He understands and agrees to proceed.  I spoke with Dr. Excell Seltzer today and we will bridge him on Lovenox due to his aortic valve.  We will stop his Coumadin on March 24 and his Lovenox will stop on March 27.  His operation is scheduled for March 28.    Janetta Hora. Fields, MD Electronically Signed  CEF/MEDQ  D:  02/26/2011  T:  02/27/2011  Job:  [id number]  cc:   Gwen Pounds, MD Veverly Fells. Excell Seltzer, MD Noralyn Pick. Eden Emms, MD, [facility]

## 2011-03-03 ENCOUNTER — Other Ambulatory Visit: Payer: Self-pay | Admitting: Vascular Surgery

## 2011-03-03 ENCOUNTER — Ambulatory Visit (HOSPITAL_COMMUNITY)
Admission: RE | Admit: 2011-03-03 | Discharge: 2011-03-03 | Disposition: A | Discharge status: Home / Self Care | Payer: BC Managed Care – PPO | Source: Ambulatory Visit | Attending: Vascular Surgery | Admitting: Vascular Surgery

## 2011-03-03 ENCOUNTER — Encounter (HOSPITAL_COMMUNITY)
Admission: RE | Admit: 2011-03-03 | Discharge: 2011-03-03 | Disposition: A | Discharge status: Home / Self Care | Payer: BC Managed Care – PPO | Source: Ambulatory Visit | Attending: Vascular Surgery | Admitting: Vascular Surgery

## 2011-03-03 ENCOUNTER — Telehealth: Payer: Self-pay | Admitting: Cardiovascular Disease

## 2011-03-03 DIAGNOSIS — Z01818 Encounter for other preprocedural examination: Secondary | ICD-10-CM | POA: Insufficient documentation

## 2011-03-03 DIAGNOSIS — R059 Cough, unspecified: Secondary | ICD-10-CM | POA: Insufficient documentation

## 2011-03-03 DIAGNOSIS — I739 Peripheral vascular disease, unspecified: Secondary | ICD-10-CM

## 2011-03-03 DIAGNOSIS — Z01811 Encounter for preprocedural respiratory examination: Secondary | ICD-10-CM | POA: Insufficient documentation

## 2011-03-03 DIAGNOSIS — R05 Cough: Secondary | ICD-10-CM | POA: Insufficient documentation

## 2011-03-03 DIAGNOSIS — Z87891 Personal history of nicotine dependence: Secondary | ICD-10-CM | POA: Insufficient documentation

## 2011-03-03 DIAGNOSIS — I10 Essential (primary) hypertension: Secondary | ICD-10-CM | POA: Insufficient documentation

## 2011-03-03 DIAGNOSIS — Z01812 Encounter for preprocedural laboratory examination: Secondary | ICD-10-CM | POA: Insufficient documentation

## 2011-03-03 LAB — URINALYSIS, ROUTINE W REFLEX MICROSCOPIC
Hgb urine dipstick: NEGATIVE
Nitrite: NEGATIVE
Protein, ur: NEGATIVE mg/dL
Specific Gravity, Urine: 1.014 (ref 1.005–1.030)
Urobilinogen, UA: 0.2 mg/dL (ref 0.0–1.0)

## 2011-03-03 LAB — COMPREHENSIVE METABOLIC PANEL
ALT: 24 U/L (ref 0–53)
Alkaline Phosphatase: 56 U/L (ref 39–117)
BUN: 12 mg/dL (ref 6–23)
CO2: 30 mEq/L (ref 19–32)
Calcium: 9.8 mg/dL (ref 8.4–10.5)
GFR calc non Af Amer: >60 mL/min (ref >60)
Glucose, Bld: 165 mg/dL — ABNORMAL HIGH (ref 70–99)
Sodium: 140 mEq/L (ref 135–145)
Total Protein: 6.9 g/dL (ref 6.0–8.3)

## 2011-03-03 LAB — SURGICAL PCR SCREEN: Staphylococcus aureus: NEGATIVE

## 2011-03-03 LAB — PROTIME-INR
INR: 1.02 (ref 0.00–1.49)
Prothrombin Time: 13.6 seconds (ref 11.6–15.2)

## 2011-03-03 LAB — CBC
HCT: 46.4 % (ref 39.0–52.0)
MCHC: 35.3 g/dL (ref 30.0–36.0)
Platelets: 248 10*3/uL (ref 150–400)
RDW: 13.5 % (ref 11.5–15.5)
WBC: 9.3 10*3/uL (ref 4.0–10.5)

## 2011-03-03 NOTE — Telephone Encounter (Signed)
Faxed Stress to Crystal at [facility] ([phone]).

## 2011-03-03 NOTE — Telephone Encounter (Signed)
LOV faxed to Crystal @ [facility] to [phone] KM3/27/12

## 2011-03-04 ENCOUNTER — Other Ambulatory Visit: Payer: Self-pay | Admitting: Vascular Surgery

## 2011-03-04 ENCOUNTER — Observation Stay (HOSPITAL_COMMUNITY)
Admission: RE | Admit: 2011-03-04 | Discharge: 2011-03-05 | DRG: 111 | Disposition: A | Discharge status: Home / Self Care | Payer: BC Managed Care – PPO | Source: Ambulatory Visit | Attending: Vascular Surgery | Admitting: Vascular Surgery

## 2011-03-04 DIAGNOSIS — I743 Embolism and thrombosis of arteries of the lower extremities: Secondary | ICD-10-CM

## 2011-03-04 DIAGNOSIS — I10 Essential (primary) hypertension: Secondary | ICD-10-CM | POA: Insufficient documentation

## 2011-03-04 DIAGNOSIS — E119 Type 2 diabetes mellitus without complications: Secondary | ICD-10-CM | POA: Insufficient documentation

## 2011-03-04 DIAGNOSIS — I70219 Atherosclerosis of native arteries of extremities with intermittent claudication, unspecified extremity: Principal | ICD-10-CM | POA: Insufficient documentation

## 2011-03-04 DIAGNOSIS — Z951 Presence of aortocoronary bypass graft: Secondary | ICD-10-CM | POA: Insufficient documentation

## 2011-03-04 DIAGNOSIS — I2510 Atherosclerotic heart disease of native coronary artery without angina pectoris: Secondary | ICD-10-CM | POA: Insufficient documentation

## 2011-03-04 DIAGNOSIS — Z954 Presence of other heart-valve replacement: Secondary | ICD-10-CM | POA: Insufficient documentation

## 2011-03-04 LAB — GLUCOSE, CAPILLARY
Glucose-Capillary: 209 mg/dL — ABNORMAL HIGH (ref 70–99)
Glucose-Capillary: 248 mg/dL — ABNORMAL HIGH (ref 70–99)

## 2011-03-05 DIAGNOSIS — I70219 Atherosclerosis of native arteries of extremities with intermittent claudication, unspecified extremity: Secondary | ICD-10-CM

## 2011-03-05 DIAGNOSIS — Z48812 Encounter for surgical aftercare following surgery on the circulatory system: Secondary | ICD-10-CM

## 2011-03-05 LAB — BASIC METABOLIC PANEL
BUN: 7 mg/dL (ref 6–23)
CO2: 31 mEq/L (ref 19–32)
Calcium: 8.9 mg/dL (ref 8.4–10.5)
Creatinine, Ser: 0.8 mg/dL (ref 0.4–1.5)
Glucose, Bld: 145 mg/dL — ABNORMAL HIGH (ref 70–99)
Sodium: 138 mEq/L (ref 135–145)

## 2011-03-05 LAB — CBC
MCH: 31.5 pg (ref 26.0–34.0)
MCHC: 33.9 g/dL (ref 30.0–36.0)
MCV: 92.7 fL (ref 78.0–100.0)
Platelets: 189 10*3/uL (ref 150–400)
RDW: 13.9 % (ref 11.5–15.5)

## 2011-03-06 LAB — TYPE AND SCREEN
ABO/RH(D): O POS
Antibody Screen: NEGATIVE
Unit division: 0

## 2011-03-09 LAB — GLUCOSE, CAPILLARY
Glucose-Capillary: 333 mg/dL — ABNORMAL HIGH (ref 70–99)
Glucose-Capillary: 91 mg/dL (ref 70–99)
Glucose-Capillary: 184 mg/dL — ABNORMAL HIGH (ref 70–99)

## 2011-03-11 ENCOUNTER — Ambulatory Visit (INDEPENDENT_AMBULATORY_CARE_PROVIDER_SITE_OTHER): Payer: BC Managed Care – PPO | Admitting: *Deleted

## 2011-03-11 DIAGNOSIS — G459 Transient cerebral ischemic attack, unspecified: Secondary | ICD-10-CM

## 2011-03-11 DIAGNOSIS — I359 Nonrheumatic aortic valve disorder, unspecified: Secondary | ICD-10-CM

## 2011-03-11 DIAGNOSIS — Z7901 Long term (current) use of anticoagulants: Secondary | ICD-10-CM

## 2011-03-11 LAB — POCT INR: INR: 2.1

## 2011-03-12 ENCOUNTER — Ambulatory Visit (INDEPENDENT_AMBULATORY_CARE_PROVIDER_SITE_OTHER): Payer: BC Managed Care – PPO

## 2011-03-12 DIAGNOSIS — S301XXA Contusion of abdominal wall, initial encounter: Secondary | ICD-10-CM

## 2011-03-12 NOTE — Assessment & Plan Note (Signed)
OFFICE VISIT  [name] [name], [name] DOB:  [birth year]                                       03/12/2011 CHART#:[id number]  DATE OF SURGERY:  March 04, 2011.  The patient presents today for evaluation of pain and swelling in the left lower extremity.  He is status post endarterectomy of the left external iliac, common femoral, profunda, and SFA arteries with profundoplasty on March 04, 2011.  The patient was discharged home in stable condition and was without complaint at the time of discharge. Today he states that after he returned home, he continued to note progressive swelling in the left groin and left lower extremity as well as a stinging discomfort in the left groin.  The patient has a mechanical valve and was on Lovenox with his Coumadin restarted at discharge from the hospital.  His INR was checked yesterday on March 11, 2011, and it was noted to be 2.1.  His Lovenox was stopped yesterday as his cardiologist felt that his INR was therapeutic.  The patient denies fevers and chills.  He denies claudication symptoms.  PHYSICAL EXAMINATION:  The patient is afebrile with stable vital signs in the office.  On inspection of the left lower extremity, there is 1+ pretibial pitting edema.  He has a palpable posterior tibial pulse.  The left lower extremity is warm and well-perfused.  The left groin reveals edema and some erythema that extends from the groin to the mid thigh on the medial aspect.  There is no evidence of any drainage or open wounds. The incision is clean, dry, intact.  There is significant swelling in the groin, consistent with either hematoma versus seroma.  ASSESSMENT/PLAN:  Dr. Darrick Penna has evaluated the patient today as well. The patient will be scheduled for I and D of the left groin tomorrow, March 13, 2011.  This will be performed by Dr. Darrick Penna.  The patient is instructed to not take his Coumadin tomorrow morning, as he has already taken it  today.  He is not currently taking Lovenox.  Dr. Darrick Penna explained the procedure and the risks and benefits to the patient, and he has agreed to proceed.  Pecola Leisure, PA  Charles E. Fields, MD Electronically Signed  AY/MEDQ  D:  03/12/2011  T:  03/12/2011  Job:  [id number]

## 2011-03-13 ENCOUNTER — Inpatient Hospital Stay (HOSPITAL_COMMUNITY)
Admission: RE | Admit: 2011-03-13 | Discharge: 2011-03-25 | DRG: 394 | Disposition: A | Discharge status: Home Health | Payer: BC Managed Care – PPO | Source: Ambulatory Visit | Attending: Vascular Surgery | Admitting: Vascular Surgery

## 2011-03-13 DIAGNOSIS — Z79899 Other long term (current) drug therapy: Secondary | ICD-10-CM

## 2011-03-13 DIAGNOSIS — J4489 Other specified chronic obstructive pulmonary disease: Secondary | ICD-10-CM | POA: Diagnosis present

## 2011-03-13 DIAGNOSIS — I739 Peripheral vascular disease, unspecified: Secondary | ICD-10-CM | POA: Diagnosis present

## 2011-03-13 DIAGNOSIS — J449 Chronic obstructive pulmonary disease, unspecified: Secondary | ICD-10-CM | POA: Diagnosis present

## 2011-03-13 DIAGNOSIS — E119 Type 2 diabetes mellitus without complications: Secondary | ICD-10-CM | POA: Diagnosis present

## 2011-03-13 DIAGNOSIS — Z87891 Personal history of nicotine dependence: Secondary | ICD-10-CM

## 2011-03-13 DIAGNOSIS — T8140XA Infection following a procedure, unspecified, initial encounter: Secondary | ICD-10-CM | POA: Diagnosis not present

## 2011-03-13 DIAGNOSIS — I10 Essential (primary) hypertension: Secondary | ICD-10-CM | POA: Diagnosis present

## 2011-03-13 DIAGNOSIS — S301XXA Contusion of abdominal wall, initial encounter: Secondary | ICD-10-CM

## 2011-03-13 DIAGNOSIS — Y838 Other surgical procedures as the cause of abnormal reaction of the patient, or of later complication, without mention of misadventure at the time of the procedure: Secondary | ICD-10-CM | POA: Diagnosis not present

## 2011-03-13 DIAGNOSIS — I898 Other specified noninfective disorders of lymphatic vessels and lymph nodes: Principal | ICD-10-CM | POA: Diagnosis present

## 2011-03-13 DIAGNOSIS — Z951 Presence of aortocoronary bypass graft: Secondary | ICD-10-CM

## 2011-03-13 DIAGNOSIS — E7800 Pure hypercholesterolemia, unspecified: Secondary | ICD-10-CM | POA: Diagnosis present

## 2011-03-13 DIAGNOSIS — Z954 Presence of other heart-valve replacement: Secondary | ICD-10-CM

## 2011-03-13 DIAGNOSIS — Z9861 Coronary angioplasty status: Secondary | ICD-10-CM

## 2011-03-13 DIAGNOSIS — B965 Pseudomonas (aeruginosa) (mallei) (pseudomallei) as the cause of diseases classified elsewhere: Secondary | ICD-10-CM | POA: Diagnosis not present

## 2011-03-13 DIAGNOSIS — Z7901 Long term (current) use of anticoagulants: Secondary | ICD-10-CM

## 2011-03-13 LAB — PROTIME-INR
INR: 1.85 — ABNORMAL HIGH (ref 0.00–1.49)
INR: 1.6 — ABNORMAL HIGH (ref 0.00–1.49)
Prothrombin Time: 21.5 seconds — ABNORMAL HIGH (ref 11.6–15.2)
Prothrombin Time: 18.8 seconds — ABNORMAL HIGH (ref 11.6–15.2)

## 2011-03-13 LAB — CBC
MCHC: 34.3 g/dL (ref 30.0–36.0)
MCHC: 34.3 g/dL (ref 30.0–36.0)
MCV: 96.3 fL (ref 78.0–100.0)
Platelets: 202 10*3/uL (ref 150–400)
RDW: 13.2 % (ref 11.5–15.5)
RDW: 13.1 % (ref 11.5–15.5)
WBC: 10 10*3/uL (ref 4.0–10.5)

## 2011-03-13 LAB — GLUCOSE, CAPILLARY: Glucose-Capillary: 277 mg/dL — ABNORMAL HIGH (ref 70–99)

## 2011-03-13 LAB — COMPREHENSIVE METABOLIC PANEL
AST: 23 U/L (ref 0–37)
CO2: 32 mEq/L (ref 19–32)
Calcium: 9.2 mg/dL (ref 8.4–10.5)
Creatinine, Ser: 0.92 mg/dL (ref 0.4–1.5)
GFR calc Af Amer: >60 mL/min (ref >60)
GFR calc non Af Amer: >60 mL/min (ref >60)
Sodium: 137 mEq/L (ref 135–145)
Total Protein: 7.4 g/dL (ref 6.0–8.3)

## 2011-03-13 LAB — DIFFERENTIAL
Eosinophils Relative: 2 % (ref 0–5)
Lymphocytes Relative: 19 % (ref 12–46)
Lymphs Abs: 1.7 10*3/uL (ref 0.7–4.0)
Monocytes Relative: 11 % (ref 3–12)

## 2011-03-13 LAB — BASIC METABOLIC PANEL
BUN: 12 mg/dL (ref 6–23)
CO2: 27 mEq/L (ref 19–32)
Calcium: 9 mg/dL (ref 8.4–10.5)
Creatinine, Ser: 0.88 mg/dL (ref 0.4–1.5)
GFR calc non Af Amer: >60 mL/min (ref >60)
Glucose, Bld: 242 mg/dL — ABNORMAL HIGH (ref 70–99)

## 2011-03-13 LAB — APTT: aPTT: 40 seconds — ABNORMAL HIGH (ref 24–37)

## 2011-03-14 LAB — BASIC METABOLIC PANEL
CO2: 31 mEq/L (ref 19–32)
Calcium: 8.7 mg/dL (ref 8.4–10.5)
Creatinine, Ser: 0.84 mg/dL (ref 0.4–1.5)
GFR calc Af Amer: >60 mL/min (ref >60)
Glucose, Bld: 145 mg/dL — ABNORMAL HIGH (ref 70–99)

## 2011-03-14 LAB — GLUCOSE, CAPILLARY: Glucose-Capillary: 149 mg/dL — ABNORMAL HIGH (ref 70–99)

## 2011-03-14 LAB — PROTIME-INR: Prothrombin Time: 18.5 seconds — ABNORMAL HIGH (ref 11.6–15.2)

## 2011-03-15 LAB — PROTIME-INR
INR: 1.21 (ref 0.00–1.49)
Prothrombin Time: 15.5 seconds — ABNORMAL HIGH (ref 11.6–15.2)

## 2011-03-15 LAB — CBC
Platelets: 520 10*3/uL — ABNORMAL HIGH (ref 150–400)
RBC: 3.86 MIL/uL — ABNORMAL LOW (ref 4.22–5.81)
WBC: 11.3 10*3/uL — ABNORMAL HIGH (ref 4.0–10.5)

## 2011-03-15 LAB — GLUCOSE, CAPILLARY
Glucose-Capillary: 170 mg/dL — ABNORMAL HIGH (ref 70–99)
Glucose-Capillary: 206 mg/dL — ABNORMAL HIGH (ref 70–99)

## 2011-03-15 LAB — HEPARIN LEVEL (UNFRACTIONATED): Heparin Unfractionated: 0.14 IU/mL — ABNORMAL LOW (ref 0.30–0.70)

## 2011-03-16 LAB — HEPARIN LEVEL (UNFRACTIONATED): Heparin Unfractionated: 0.51 IU/mL (ref 0.30–0.70)

## 2011-03-16 LAB — GLUCOSE, CAPILLARY: Glucose-Capillary: 190 mg/dL — ABNORMAL HIGH (ref 70–99)

## 2011-03-16 LAB — CBC
HCT: 35.2 % — ABNORMAL LOW (ref 39.0–52.0)
MCV: 91 fL (ref 78.0–100.0)
RBC: 3.87 MIL/uL — ABNORMAL LOW (ref 4.22–5.81)
RDW: 13.3 % (ref 11.5–15.5)
WBC: 11.3 10*3/uL — ABNORMAL HIGH (ref 4.0–10.5)

## 2011-03-16 LAB — PROTIME-INR: INR: 1.34 (ref 0.00–1.49)

## 2011-03-16 NOTE — Op Note (Signed)
  NAME:  [name] [name], [name] [name] NO.:  [id number]  MEDICAL RECORD NO.:  [id number]           PATIENT TYPE:  I  LOCATION:  2035                         FACILITY:  [facility]  PHYSICIAN:  Janetta Hora. Katherina Wimer, MD  DATE OF BIRTH:  03/31/1946  DATE OF PROCEDURE:  03/13/2011 DATE OF DISCHARGE:                              OPERATIVE REPORT   PROCEDURE:  Incision and drainage of left groin.  PREOPERATIVE DIAGNOSIS:  Left groin hematoma.  POSTOPERATIVE DIAGNOSIS:  Left groin lymphocele.  ANESTHESIA:  General.  ASSISTANT:  Nurse.  OPERATIVE FINDINGS:  Lymphocele, approximately 30 mL of fluid.  DRAINS:  10 flat JP left groin.  OPERATIVE DETAIL:  After obtaining informed consent, the patient was taken to the operating room.  The patient was placed in the supine position on the operating table.  After induction of general anesthesia and endotracheal intubation, the patient's entire left groin and upper thigh were prepped and draped in usual sterile fashion.  A preexisting longitudinal incision on the left groin was reopened, carried down through the subcutaneous tissues.  Upon entering the deeper subcutaneous tissues, a lymphocele containing approximately 30 mL of primarily clearish fluid was evacuated from the groin.  This was under some pressure.  After this was evacuated, the groin was thoroughly explored. The cavity of the lymphocele did not extend down to the patch and the patch was not exposed.  Dissection was not carried down to the level of the patch.  There was no active bleeding.  There was no obvious hematoma cavity.  This appeared to primarily be coming from a large partially transected lymphatic.  The lymphatic was oversewn with a running 3-0 Vicryl suture back and fourth to prevent any further communication from this lymphatic and this was done on both sides of the partially transected note.  This was also cauterized, try to seal any lymphatic leak, and then cryoglue  was applied to the edges of the lymphatic again to try to prevent any further drainage.  The wound was thoroughly irrigated with 2 liters of normal saline solution.  Next, a separate stab incision was made on the anterior left thigh and a 10 flat Al Pimple drain brought through this, and this was placed in the subcutaneous tissue.  The drain was sutured to the skin with a nylon stitch.  The groin was again thoroughly irrigated with normal saline solution.  The groin was then closed in multiple layers using first interrupted 2-0 Vicryl sutures followed by 3-0 Vicryl subcutaneous stitch and a 4-0 Vicryl subcuticular stitch.  The patient tolerate the procedure well and there were no complications.  Instrument, sponge, and needle counts correct at the end of the case.  The patient was taken to the recovery room in stable condition.     Janetta Hora. Adara Kittle, MD     CEF/MEDQ  D:  03/13/2011  T:  03/14/2011  Job:  [id number]  Electronically Signed by Fabienne Bruns MD on 03/16/2011 07:49:45 AM

## 2011-03-16 NOTE — Op Note (Signed)
NAME:  [name] [name], REASONS NO.:  [id number]  MEDICAL RECORD NO.:  [id number]           PATIENT TYPE:  I  LOCATION:  3313                         FACILITY:  [facility]  PHYSICIAN:  Janetta Hora. Fields, MD  DATE OF BIRTH:  [birth year]  DATE OF PROCEDURE:  03/04/2011 DATE OF DISCHARGE:                              OPERATIVE REPORT   PROCEDURE:  Left endarterectomy of left external iliac, common femoral, profunda femoris, and superficial femoral artery with profundoplasty.  PREOPERATIVE DIAGNOSIS:  Claudication, left leg.  POSTOPERATIVE DIAGNOSIS:  Claudication, left leg.  ANESTHESIA:  General.  ASSISTANT:  Pecola Leisure, PA-C  OPERATIVE FINDINGS: 1. Long segment of calcified exophytic plaque from external iliac,     common femoral, superficial femoral, and profunda femoris artery. 2. Dacron patch extending from approximately 3 cm above the inguinal     ligament to 3 cm down the superficial femoral artery and 2 cm down     the profunda femoris artery.  OPERATIVE DETAILS:  After obtaining informed consent, the patient was taken to the operating room.  The patient was placed in a supine position on the operating table.  After induction of general anesthesia and endotracheal intubation, Foley catheter was placed.  Next, the patient was prepped and draped in usual sterile fashion from the umbilicus down to the knee on the left leg.  A longitudinal incision was made in the left groin over the common femoral artery.  Incision was carried down through subcutaneous tissues down to the level of the left common femoral artery.  Left common femoral artery was dissected free circumferentially.  This was heavily calcified with plaque primarily on the posterior wall, but with islands of exophytic plaque palpable through the artery.  Dissection was carried up underneath the inguinal ligament.  The circumflex iliac branch was ligated and divided between silk ties.  The pulse  quality improved significantly underneath the inguinal ligament.  A suitable site was found for clamping several centimeters above the inguinal ligament on the external iliac artery. Dissection was then carried down the common femoral artery down to the femoral bifurcation.  The profunda femoris artery was dissected free circumferentially.  This was very calcified at its origin and the entire femoral bifurcation was heavily calcified.  Dissection was carried down to the second branch point of the profunda femoris artery where the artery was still thickened, but of better quality.  Vessel loops were placed around 3 large branches in this location.  Dissection was carried down the superficial femoral artery several centimeters preoperative arteriogram had showed that the plaque dissipated several centimeters down into the SFA and I dissected this down to the area where the artery became soft in quality.  The patient was given 7000 units of intravenous heparin.  She was also given an additional 5000 units of heparin during the course of the case. Longitudinal opening was made in the common femoral artery.  There was still some bleed from the external iliac artery so additional two Henley clamps were placed on the distal external iliac artery for proximal control.  The distal arteries were controlled on the profunda with  vessel loops and on the superficial femoral artery with an additional Henley clamp.  There was a large amount of exophytic calcified plaque throughout the femoral bifurcation as well as the entire common femoral artery and the external iliac artery.  Endarterectomy was begun in a suitable plane and good distal endpoint was obtained in the superficial femoral artery, although there was a slight step-off so this was tacked posteriorly with several 7-0 Prolene sutures.  A good distal endpoint was obtained in the profunda femoris artery.  There was still some plaque in the  external iliac artery, but there was excellent arterial inflow and there was a suitable area to complete the patch and have a good lumen in the external iliac artery.  At this point, an 18 x 10 cm Dacron tube graft was brought up in the operative field and this was split longitudinally to create a Dacron patch.  This was fashioned so that it fit the artery to appropriate size.  This then sewn on using a running 5-0 and 6-0 Prolene suture.  Just prior to completion anastomosis, everything was fore bled, back bled, and thoroughly flushed.  Anastomosis was secured, clamps were released, there was good pulsatile flow in the common femoral artery as well into the superficial femoral and profunda femoris arteries at this time.  Hemostasis was obtained with thrombin and Gelfoam as well as reversal of the heparin fully with protamine.  Doppler was used to evaluate superficial and femoral profunda femoris artery and there was good flow through both these.  At this point, hemostasis had been obtained and the groin was closed in multiple layers of running 2-0 and 3-0 Vicryl suture and a 4-0 Vicryl subcuticular stitch in the skin.  The patient tolerated the procedure well and there were no complications.  Instrument, sponge, and needle counts were correct at the end of the case.  The patient's foot was pink and warm at the conclusion of the case and well perfused.  The patient was extubated in the operating room and taken to the recovery room in a stable condition.     Janetta Hora. Fields, MD     CEF/MEDQ  D:  03/04/2011  T:  03/05/2011  Job:  [id number]  Electronically Signed by Fabienne Bruns MD on 03/16/2011 07:49:37 AM

## 2011-03-17 DIAGNOSIS — I739 Peripheral vascular disease, unspecified: Secondary | ICD-10-CM

## 2011-03-17 LAB — GLUCOSE, CAPILLARY
Glucose-Capillary: 132 mg/dL — ABNORMAL HIGH (ref 70–99)
Glucose-Capillary: 195 mg/dL — ABNORMAL HIGH (ref 70–99)
Glucose-Capillary: 80 mg/dL (ref 70–99)

## 2011-03-17 LAB — CBC
Hemoglobin: 11.7 g/dL — ABNORMAL LOW (ref 13.0–17.0)
RBC: 3.72 MIL/uL — ABNORMAL LOW (ref 4.22–5.81)
WBC: 11.9 10*3/uL — ABNORMAL HIGH (ref 4.0–10.5)

## 2011-03-17 LAB — HEPARIN LEVEL (UNFRACTIONATED): Heparin Unfractionated: 0.32 IU/mL (ref 0.30–0.70)

## 2011-03-18 LAB — CBC
Hemoglobin: 11.9 g/dL — ABNORMAL LOW (ref 13.0–17.0)
MCH: 31 pg (ref 26.0–34.0)
MCV: 91.1 fL (ref 78.0–100.0)
RBC: 3.84 MIL/uL — ABNORMAL LOW (ref 4.22–5.81)
WBC: 10.5 10*3/uL (ref 4.0–10.5)

## 2011-03-18 LAB — GLUCOSE, CAPILLARY: Glucose-Capillary: 112 mg/dL — ABNORMAL HIGH (ref 70–99)

## 2011-03-18 LAB — HEPARIN LEVEL (UNFRACTIONATED): Heparin Unfractionated: 0.34 IU/mL (ref 0.30–0.70)

## 2011-03-19 DIAGNOSIS — S301XXA Contusion of abdominal wall, initial encounter: Secondary | ICD-10-CM

## 2011-03-19 LAB — WOUND CULTURE

## 2011-03-19 LAB — CBC
Hemoglobin: 12 g/dL — ABNORMAL LOW (ref 13.0–17.0)
MCH: 31.2 pg (ref 26.0–34.0)
MCHC: 33.9 g/dL (ref 30.0–36.0)
RDW: 13.5 % (ref 11.5–15.5)

## 2011-03-19 LAB — GLUCOSE, CAPILLARY
Glucose-Capillary: 95 mg/dL (ref 70–99)
Glucose-Capillary: 134 mg/dL — ABNORMAL HIGH (ref 70–99)
Glucose-Capillary: 151 mg/dL — ABNORMAL HIGH (ref 70–99)

## 2011-03-20 LAB — GLUCOSE, CAPILLARY: Glucose-Capillary: 132 mg/dL — ABNORMAL HIGH (ref 70–99)

## 2011-03-20 LAB — HEPARIN LEVEL (UNFRACTIONATED): Heparin Unfractionated: 0.22 IU/mL — ABNORMAL LOW (ref 0.30–0.70)

## 2011-03-20 LAB — CBC
Hemoglobin: 11.3 g/dL — ABNORMAL LOW (ref 13.0–17.0)
MCHC: 32.3 g/dL (ref 30.0–36.0)
RDW: 13.5 % (ref 11.5–15.5)
WBC: 9.6 10*3/uL (ref 4.0–10.5)

## 2011-03-21 LAB — PROTIME-INR
INR: 1.1 (ref 0.00–1.49)
Prothrombin Time: 14.4 seconds (ref 11.6–15.2)

## 2011-03-21 LAB — GLUCOSE, CAPILLARY
Glucose-Capillary: 144 mg/dL — ABNORMAL HIGH (ref 70–99)
Glucose-Capillary: 154 mg/dL — ABNORMAL HIGH (ref 70–99)
Glucose-Capillary: 178 mg/dL — ABNORMAL HIGH (ref 70–99)

## 2011-03-21 LAB — CBC
Platelets: 479 10*3/uL — ABNORMAL HIGH (ref 150–400)
RBC: 3.65 MIL/uL — ABNORMAL LOW (ref 4.22–5.81)
WBC: 8.3 10*3/uL (ref 4.0–10.5)

## 2011-03-21 LAB — HEPARIN LEVEL (UNFRACTIONATED): Heparin Unfractionated: 0.55 IU/mL (ref 0.30–0.70)

## 2011-03-22 LAB — CBC
MCH: 30.4 pg (ref 26.0–34.0)
MCV: 92.3 fL (ref 78.0–100.0)
Platelets: 455 10*3/uL — ABNORMAL HIGH (ref 150–400)
RBC: 3.92 MIL/uL — ABNORMAL LOW (ref 4.22–5.81)
RDW: 13.8 % (ref 11.5–15.5)

## 2011-03-22 LAB — GLUCOSE, CAPILLARY
Glucose-Capillary: 190 mg/dL — ABNORMAL HIGH (ref 70–99)
Glucose-Capillary: 92 mg/dL (ref 70–99)

## 2011-03-22 LAB — WOUND CULTURE

## 2011-03-23 LAB — CBC
HCT: 36.7 % — ABNORMAL LOW (ref 39.0–52.0)
Hemoglobin: 12.1 g/dL — ABNORMAL LOW (ref 13.0–17.0)
MCHC: 33 g/dL (ref 30.0–36.0)
WBC: 8.3 10*3/uL (ref 4.0–10.5)

## 2011-03-23 LAB — GLUCOSE, CAPILLARY
Glucose-Capillary: 205 mg/dL — ABNORMAL HIGH (ref 70–99)
Glucose-Capillary: 90 mg/dL (ref 70–99)
Glucose-Capillary: 150 mg/dL — ABNORMAL HIGH (ref 70–99)

## 2011-03-23 LAB — PROTIME-INR
INR: 1.37 (ref 0.00–1.49)
Prothrombin Time: 17.1 seconds — ABNORMAL HIGH (ref 11.6–15.2)

## 2011-03-23 LAB — HEPARIN LEVEL (UNFRACTIONATED): Heparin Unfractionated: 0.66 IU/mL (ref 0.30–0.70)

## 2011-03-24 LAB — ANAEROBIC CULTURE

## 2011-03-24 LAB — CBC
MCH: 30.9 pg (ref 26.0–34.0)
MCHC: 33.6 g/dL (ref 30.0–36.0)
Platelets: 416 10*3/uL — ABNORMAL HIGH (ref 150–400)
RBC: 3.95 MIL/uL — ABNORMAL LOW (ref 4.22–5.81)

## 2011-03-24 LAB — GLUCOSE, CAPILLARY
Glucose-Capillary: 153 mg/dL — ABNORMAL HIGH (ref 70–99)
Glucose-Capillary: 182 mg/dL — ABNORMAL HIGH (ref 70–99)
Glucose-Capillary: 97 mg/dL (ref 70–99)

## 2011-03-24 LAB — PROTIME-INR
INR: 1.79 — ABNORMAL HIGH (ref 0.00–1.49)
Prothrombin Time: 21 seconds — ABNORMAL HIGH (ref 11.6–15.2)

## 2011-03-24 LAB — HEPARIN LEVEL (UNFRACTIONATED): Heparin Unfractionated: 0.53 IU/mL (ref 0.30–0.70)

## 2011-03-25 LAB — HEPARIN LEVEL (UNFRACTIONATED): Heparin Unfractionated: 0.6 IU/mL (ref 0.30–0.70)

## 2011-03-25 LAB — CBC
Hemoglobin: 13.4 g/dL (ref 13.0–17.0)
Platelets: 400 10*3/uL (ref 150–400)
RBC: 4.38 MIL/uL (ref 4.22–5.81)
WBC: 8.1 10*3/uL (ref 4.0–10.5)

## 2011-03-25 LAB — PROTIME-INR
INR: 1.79 — ABNORMAL HIGH (ref 0.00–1.49)
Prothrombin Time: 21 seconds — ABNORMAL HIGH (ref 11.6–15.2)

## 2011-03-25 LAB — BASIC METABOLIC PANEL
BUN: 7 mg/dL (ref 6–23)
Calcium: 9.5 mg/dL (ref 8.4–10.5)
Creatinine, Ser: 0.86 mg/dL (ref 0.4–1.5)
GFR calc non Af Amer: >60 mL/min (ref >60)

## 2011-03-25 LAB — GLUCOSE, CAPILLARY: Glucose-Capillary: 111 mg/dL — ABNORMAL HIGH (ref 70–99)

## 2011-03-26 ENCOUNTER — Ambulatory Visit: Payer: BC Managed Care – PPO | Admitting: Vascular Surgery

## 2011-03-26 NOTE — Op Note (Signed)
  NAME:  [name] [name], [name] [name] NO.:  [id number]  MEDICAL RECORD NO.:  [id number]           PATIENT TYPE:  I  LOCATION:  2035                         FACILITY:  [facility]  PHYSICIAN:  Larina Earthly, M.D.    DATE OF BIRTH:  June 14, 1946  DATE OF PROCEDURE:  03/19/2011 DATE OF DISCHARGE:                              OPERATIVE REPORT   PREOPERATIVE DIAGNOSIS:  Left groin infection.  POSTOPERATIVE DIAGNOSIS:  Left groin infection.  Debridement of left groin infection and placement of a wound VAC surgery.  SURGEON:  Larina Earthly, MD  ASSISTANT:  Nurse  ANESTHESIA:  General endotracheal.  COMPLICATIONS:  None.  DISPOSITION:  Recovery room, stable.  INDICATION FOR PROCEDURE:  The patient is a 65 year old gentleman status post extensive endarterectomy and Dacron patch for occlusive disease of his left common femoral and external iliac artery.  He developed groin lymphocele, was taken back to the operating room by Dr. Darrick Penna on March 13, 2011.  He had a drain placed at this time.  There was no evidence of exposure of the graft to that procedure.  Today, he noted to have wound separation and gross purulence and necrotic debris in the base of the wound.  He was taken back to the operating room for irrigation and debridement.  I discussed this with the family and explained that if prosthetic graft was exposed of this and all likelihood would be removed with vein patching.  PROCEDURE IN DETAIL:  The patient was taken to the operating room, place in supine position where the left leg and left groin prepped and draped in a sterile fashion.  Ultrasound SonoSite imaging revealed patency of his saphenous vein from his knee distally, he had harvest of a saphenous vein from his knee proximally.  The wound was reopened.  All necrotic debris was removed.  There was some BioGlue presumably from the first procedure that was in the base of the wound and this was all debrided. There was  fat necrosis and there was no evidence of involvement of the Dacron patch.  The wound was copiously irrigated after all necrotic debris was removed.  There was some purulence in the drain tract and this was debrided as well. The wound edges did look viable and decision was made to place a wound VAC which was done in the usual fashion.  The patient was taken to the recovery room in stable condition.     Larina Earthly, M.D.     TFE/MEDQ  D:  03/19/2011  T:  03/20/2011  Job:  [id number]  Electronically Signed by Cassi Jenne M.D. on 03/26/2011 08:29:41 PM

## 2011-03-30 NOTE — Discharge Summary (Addendum)
NAME:  [name] [name], [name] [name] NO.:  [id number]  MEDICAL RECORD NO.:  [id number]           PATIENT TYPE:  I  LOCATION:  2035                         FACILITY:  [facility]  PHYSICIAN:  Janetta Hora. Fields, MD  DATE OF BIRTH:  Jul 23, 1946  DATE OF ADMISSION:  03/13/2011 DATE OF DISCHARGE:  03/25/2011                              DISCHARGE SUMMARY   CHIEF COMPLAINT: 1. Left groin lymphocele. 2. Left groin Pseudomonas infection requiring incision and drainage.  HISTORY OF PRESENT ILLNESS:  Mr. Partch is a 66 year old gentleman who underwent a left external iliac common femoral profunda and superficial femoral endarterectomy with Dacron patch angioplasty on March 04, 2011. He was discharged to home without any signs of infection.  After he returned home, continued no progressive swelling in the left groin and left lower extremity as well as tingling discomfort in the left groin. He has a mechanical valve and is on Lovenox and Coumadin restarted at discharge. His INR on March 11, 2011, is 2.1.  His Lovenox was stopped. His cardiologist felt his INR was therapeutic.  The patient denied any fever or chills at the office visit on March 12, 2011, and the patient was brought into the hospital for I and D of the left groin on March 13, 2011.  PAST MEDICAL HISTORY: 1. Diabetes. 2. Hypertension. 3. Hypercholesterolemia. 4. ITP. 5. Status post splenectomy for ITP. 6. Aortic valve replacement and coronary bypass grafting in 2004, for     which he is on chronic Coumadin.  HOSPITAL COURSE:  The patient was taken to the operating room on March 13, 2011, for an incision and drainage of the left groin lymphocele. Postoperatively, the patient had a JP drained, continued to drain about 100-150 mL per day.  The drainage looks purulent, it was cultured and grew out Pseudomonas.  He was started on appropriate antibiotics of Cipro and Zosyn, the bug was sensitive to this.  However, on March 19, 2011, the groin wound opened and had some purulent material still coming from the wound.  It was now more erythematous and swollen and painful and he was again taken to the operating room on March 19, 2011, by Dr. Arbie Cookey, for a debridement of left groin infection and placement of a wound VAC.  Postoperatively, he did very well.  He was restarted on his Coumadin.  He was continued on his Cipro and Zosyn was discontinued. His PT/INR were followed daily and adjustments made to his Coumadin and he was discharged on March 25, 2011.  His wounds are healing well with good granulation tissue at the base of the wound.  He will follow up with Dr. Darrick Penna, a week from Thursday.  FINAL DIAGNOSES: 1. Left groin lymphocele. 2. Infected groin wound status post I and D. 3. All of his chronic medical issues were stable while in-house and he     was restarted on Coumadin and Lovenox for home.  DISPOSITION:  The patient was discharged to home and follow up with Dr. Darrick Penna, a week from Thursday.  He will also have a PT/INR drawn on Friday March 27, 2011, by home health.  He will also have a wound VAC changes q.Monday, Wednesday, and Friday.  DISCHARGE MEDICATIONS: 1. Cipro 500 mg twice daily for 8 days. 2. Lovenox 80 mg twice daily. 3. Amlodipine 2.5 mg daily. 4. Coumadin 7.5 mg daily. 5. Glyburide. 6. Metformin 2.5/500 mg 2 tabs in the morning, 1 tab in the evening. 7. Hydrochlorothiazide 12.5 mg daily. 8. Lipitor 40 mg daily. 9. Mavik 4 mg daily. 10.Oxycodone 5 mg 1-2 tabs every 4 hours as needed for pain,     prescription was given for 30 tabs. 11.Toprol 50 mg daily.     Della Goo, PA-C   ______________________________ Janetta Hora Fields, MD    RR/MEDQ  D:  03/25/2011  T:  03/26/2011  Job:  [id number]  Electronically Signed by Fabienne Bruns MD on 03/30/2011 11:09:39 AM Electronically Signed by Della Goo PA on 03/30/2011 02:11:00 PM

## 2011-04-02 ENCOUNTER — Ambulatory Visit (INDEPENDENT_AMBULATORY_CARE_PROVIDER_SITE_OTHER): Payer: BC Managed Care – PPO | Admitting: Vascular Surgery

## 2011-04-02 DIAGNOSIS — I898 Other specified noninfective disorders of lymphatic vessels and lymph nodes: Secondary | ICD-10-CM

## 2011-04-03 NOTE — Assessment & Plan Note (Signed)
OFFICE VISIT  RAMIN, ZOLL DOB:  [birth year]                                       04/02/2011 CHART#:[id number]  The patient returns for followup today.  He underwent a left femoral endarterectomy with profundoplasty on 03/04/2011.  Postoperatively he developed a lymphocele in the left groin and some infection.  The wound deteriorated and required I and D as well as some debridement.  The wound problem did not appear to involve the patch.  He was discharged from the hospital last week with a VAC device.  He returns today for further followup.  He has not been able to ambulate enough to see if his claudication symptoms are improved.  He is changing the [facility] every other day.  PHYSICAL EXAM:  Today blood pressure is 170/92 in the left arm, heart rate is 80 and regular.  Temperature is 97.7.  He has an open wound in the left groin with very healthy-appearing granulation tissue.  The wound is approximately 8 cm x 3 cm width and length.  The deepest portion at the upper area is approximately 3 cm.  Patch is not visible. Overall the wound was clean with no evidence of infection.  In summary, he has a healing wound in his left groin with VAC therapy. He has been on antibiotics and these are scheduled to end within the next 3 days.  He will follow up with me in 2 weeks' time for a wound recheck.    Janetta Hora. Abdi Husak, MD Electronically Signed  CEF/MEDQ  D:  04/02/2011  T:  04/03/2011  Job:  [id number]

## 2011-04-08 ENCOUNTER — Ambulatory Visit (INDEPENDENT_AMBULATORY_CARE_PROVIDER_SITE_OTHER): Payer: BC Managed Care – PPO | Admitting: *Deleted

## 2011-04-08 DIAGNOSIS — I359 Nonrheumatic aortic valve disorder, unspecified: Secondary | ICD-10-CM

## 2011-04-08 DIAGNOSIS — G459 Transient cerebral ischemic attack, unspecified: Secondary | ICD-10-CM

## 2011-04-08 LAB — POCT INR: INR: 2.4

## 2011-04-16 ENCOUNTER — Ambulatory Visit (INDEPENDENT_AMBULATORY_CARE_PROVIDER_SITE_OTHER): Payer: BC Managed Care – PPO | Admitting: Vascular Surgery

## 2011-04-16 ENCOUNTER — Encounter (INDEPENDENT_AMBULATORY_CARE_PROVIDER_SITE_OTHER): Payer: BC Managed Care – PPO

## 2011-04-16 DIAGNOSIS — I739 Peripheral vascular disease, unspecified: Secondary | ICD-10-CM

## 2011-04-16 DIAGNOSIS — Z48812 Encounter for surgical aftercare following surgery on the circulatory system: Secondary | ICD-10-CM

## 2011-04-16 DIAGNOSIS — I70219 Atherosclerosis of native arteries of extremities with intermittent claudication, unspecified extremity: Secondary | ICD-10-CM

## 2011-04-17 NOTE — Assessment & Plan Note (Signed)
OFFICE VISIT  DAIVIK, OVERLEY DOB:  03-09-1946                                       04/16/2011 CHART#:[id number]  The patient returns for followup today.  He recently underwent left femoral endarterectomy and profundoplasty for claudication symptoms in his left lower extremity.  He developed wound complication in his left groin postoperatively and currently is receiving VAC therapy for an open wound in the left groin.  He still has some lower extremity swelling as well and has been minimally ambulatory as we are trying to get the left groin wound to heal.  PHYSICAL EXAM:  Today blood pressure is 169/72 in the left arm, heart rate 79 and regular, temperature 98.1.  Left groin wound is 6 cm x 3 cm on length and width, it is 2 cm in depth.  There is good granulation tissue at the base of the wound.  The left leg is still edematous approximately 25% larger than the right leg.  The patient has not been able to ambulate enough to determine whether or not his claudication symptoms are improved.  He is still disabled from his left groin wound and I believe we need to continue to keep his activity at a minimum until the wound is completely healed due to the fact that the patch from his endarterectomy would be at risk of infection.  He is making good progress and I think we will discontinue the VAC at this point and change him over to hydrogel dressings once daily.  He will follow up with me in 2 weeks' time.  He had bilateral ABIs today which showed his vessels were calcified and noncompressible. He did have biphasic posterior tibial waveforms.  Toe pressure of 41 on the left and 63 on the right.    Janetta Hora. Amariana Mirando, MD Electronically Signed  CEF/MEDQ  D:  04/16/2011  T:  04/17/2011  Job:  [id number]  cc:   [employer]ucky Rath[employer] and [employer]

## 2011-04-21 NOTE — Assessment & Plan Note (Signed)
OFFICE VISIT   [name] [name], [name] [name]  DOB:  [birth year]                                       08/31/2007  CHART#:[id number]   Patient returns for followup today after angioplasty and stenting of his  left external iliac artery in June, 2008.  He states that his  claudication symptoms are now totally resolved.  He is walking as much  as he wishes.   PHYSICAL EXAMINATION:  Blood pressure is 119/68.  Heart rate is 70 and  regular.  He has 2+ femoral pulses bilaterally.  He has absent pedal  pulses on the left side and has a 1+ dorsalis pedis pulse and posterior  tibial pulse in the right foot.  He had repeat ABIs performed today  which were greater than 1 on the left and greater than 1 on the right.  He had triphasic Doppler signals.  This is an improvement from previous  0.75 on the left and 0.84 on the right.   Overall, patient is doing well.  He is very satisfactory with his  walking distance and has essentially no claudication symptoms.  I  counseled him today on risk factor modification for prevention of  restenosis long term.  He will follow up with our surveillance protocol  to make sure he has no re-narrowing over time or sooner if his symptoms  occur.   [name] Hora. Fields, MD  Electronically Signed   CEF/MEDQ  D:  09/01/2007  T:  09/01/2007  Job:  [id number]   cc:   Gwen Pounds, MD  Noralyn Pick. Eden Emms, MD, [facility]

## 2011-04-21 NOTE — Assessment & Plan Note (Signed)
[facility]                            CARDIOLOGY OFFICE NOTE   NAME:[name] [name] ROSSA                      MRN:          [id number]  DATE:04/15/2007                            DOB:          November 04, 1946    [name] [name] returns today for followup.  He had an aortic valve  replacement with a St. Jude valve in 2004.  He also had a vein graft  sequentially to the PD PLA and a LIMA graft.  He has not had a followup  stress test since then.  He is a diabetic.  His hemoglobin A1c has been  excellent in the 6 range.  He has not had any significant chest pain,  PND, or orthopnea.  There has been no exertional dyspnea.   His activity level is actually limited by claudication.  He has had the  previous right iliac stent and no intervention to the left side, but it  is his left calf that bothers him after about 4 minutes on the  treadmill.  He normally sees Dr. Darrick Penna for this.  We do not follow his  ABI here.  I encouraged him to see Dr. Darrick Penna to have a followup ABI and  duplex examination.   From a cardiac standpoint, he has been compliant with his meds.  He has  not needed to take nitroglycerin.  He has not had concomitant carotid  disease.   Risk factors are well-modified.  He is taking a Statin drug for  hyperlipidemia.   REVIEW OF SYSTEMS:  Otherwise, negative.  He is drinking as usual.  His  drink of choice is Ketel vodka, which he has every night.  We have  talked about this in the past, but [name] [name] really will not change his ways  in regard to his social drinking at night.   MEDICATIONS:  1. Mavik 8 mg a day.  2. Toprol 50 a day.  3. Hydrochlorothiazide 12.5 a day.  4. Coumadin as directed by our clinic.  5. Lipitor 40 a day.  6. Metformin 2.5/500 two tablets in the morning and 1 at night.   EXAM:  He is a healthy-appearing middle-aged male in a good mood.  His weight is stable at 176, blood pressure 118/76, pulse 67 and  regular.  He is  afebrile. Respiratory rate is 12.  HEENT:  Normal.  Carotids normal without bruit.  There is no JVP elevation.  No thyromegaly.  No bruits.  LUNGS:  Clear with no wheezing.  Diaphragmatic motion is normal.  CARDIAC:  Remarkable for normal first heart sound.  The second heart  sound is metallic from his valve.  There is no aortic insufficiency,  murmur.  PMI is mildly increased, but not displaced.  Sternum is well-  healed.  ABDOMEN:  Benign.  Bowel sounds are positive.  There is no tenderness.  No hepatosplenomegaly.  No hepatojugular reflux.  No AAA.  Distal, he has soft bilateral femoral bruits.  He has palpable PT  bilaterally.  Popliteal +2 bilaterally.  NEURO:  Nonfocal.  SKIN:  Warm and dry.  MUSCULOSKELETAL:  No weakness.  IMPRESSION:  Stable coronary artery disease with bypass and aortic valve  resection in 2004.  I will follow up stress Myoview in a month.  I  prefer him to get followup for his left lower extremity claudication  with Dr. Darrick Penna in the meantime.   From a cardiac perspective, he is stable.  We will continue his aspirin  and beta blocker therapy.   Risk factor modification will continue with Lipitor.  He says Dr. Timothy Lasso  has checked his triglycerides and LDL recently, and they were excellent.  His hemoglobin A1c is in the 6 range.  He will continue his metformin.  He will also continue his Mavik for blood pressure control and  decreasing proteinuria.   So long as his Myoview is nonischemic, I will see him back in 6 months.  It may very well be that he needs repeat angiography by Dr. Darrick Penna for  iliac disease in the left side now.     Noralyn Pick. Eden Emms, MD, [facility]  Electronically Signed    PCN/MedQ  DD: 04/15/2007  DT: 04/15/2007  Job #: [id number]

## 2011-04-21 NOTE — Assessment & Plan Note (Signed)
[facility]                            CARDIOLOGY OFFICE NOTE   NAME:[name] [name], [name] [name]                      MRN:          [id number]  DATE:10/12/2008                            DOB:          10-06-1946    [name] returns today for follow up.  [name] [name] is status post aortic valve  replacement.  [name] [name] has hypercholesterolemia, hypertension, and diabetes.  [name] [name] has good medical followup with Dr. Timothy Lasso.  [name] [name] just had a physical.  [name] [name] states his LDL continues to be in the 60 range.  [name] [name] is not having any  side effects from his medications.   [name] [name] needs to have a colonoscopy.  [name] [name] is to see Dr. Jarold Motto.  [name] [name] is  relatively young.  [name] [name] has a prosthetic aortic valve in place and has not  had any AFib.  [name] [name] can come off his Coumadin without heparin overlap and  does not need to be hospitalized for it.   [name] [name] should have SBE prophylaxis, however.   REVIEW OF SYSTEMS:  Otherwise, remarkable for continued moderate alcohol  intake having couple drinks a day.   ALLERGIES:  [name] [name] has no known allergies.   MEDICATIONS:  1. Mavik 8 mg a day.  2. Toprol 50 a day.  3. Coumadin as directed.  4. Lipitor 40 a day.  5. Metformin 25/500 two tablets in the morning and one at night.   PHYSICAL EXAMINATION:  VITAL SIGNS:  Remarkable for a injected  complexion.  Blood pressure 130/60; pulse 79 and regular; and  respiratory rate 14, afebrile.  HEENT:  Unremarkable.  NECK:  Carotid upstrokes are normal without bruit.  No lymphadenopathy,  thyromegaly, or JVP elevation.  LUNGS:  Clear, good diaphragmatic motion.  No wheezing.  HEART:  S1 and S2 click with a soft systolic murmur.  No AI.  PMI  normal.  ABDOMEN:  Benign.  Bowel sounds positive.  No AAA.  No tenderness.  No  bruit.  No hepatosplenomegaly or hepatojugular reflux.  EXTREMITIES:  Distal pulses are intact.  No edema.  NEURO:  Nonfocal.  SKIN:  Warm and dry.  MUSCULOSKELETAL:  No muscular weakness.   IMPRESSION:  1. Aortic  valve replacement, valve sounds normal.  Followup Coumadin      Clinic for anticoagulation.  2. Hypertension, currently well controlled.  Continue low-sodium diet,      Mavik  3. Hypercholesterolemia.  Continue statin.  Followup per Dr. Timothy Lasso.  4. Moderate alcohol intake, cautioned him regarding this particularly      in light of the fact that [name] [name] is on Coumadin.  5. Need for colonoscopy.  No need for heparin overlap.  Okay to stop      Coumadin 5 days or so before colonoscopy and then resume      afterwards.     Noralyn Pick. Eden Emms, MD, [facility]  Electronically Signed    PCN/MedQ  DD: 10/12/2008  DT: 10/12/2008  Job #: [id number]   cc:   Vania Rea. Jarold Motto, MD, [place], [place], [place]  Gwen Pounds, MD

## 2011-04-21 NOTE — Assessment & Plan Note (Signed)
OFFICE VISIT   ZURI, BRADWAY  DOB:  04-Apr-1946                                       02/12/2010  CHART#:[id number]   The patient returns for followup today.  He previously underwent left  external iliac and right common iliac artery stenting in 2008.  This was  done for claudication symptoms.  He states he still has some symptoms of  cramping in both lower extremities.  This occurs at one mile of walking  currently.  He states he wishes his walking distance was better but  overall is reasonably satisfied with his current walking distance.   CHRONIC MEDICAL PROBLEMS:  Include diabetes, hypertension, elevated  cholesterol which are all currently under control.   I reviewed his arteriogram from 2008 which showed that he did have mild  right superficial femoral artery occlusive disease with posterior tibial  artery runoff bilaterally.  He did have evidence of tibial disease  bilaterally.   PHYSICAL EXAM:  Blood pressure is 137/84 in the left arm, heart rate 62  and regular.  Temperature is 98.4.  Lower extremities, he had 2+  femorals and a 2+ right dorsalis pedis and a 2+ left posterior tibial  pulse.  He had absent right posterior tibial and absent left dorsalis  pedis pulse.   He had an arterial duplex exam performed today to check the status of  his iliac stents.  However, there was considerable bowel gas present and  they were unable to see the stents on either side.   Additionally he had bilateral ABIs performed which were 0.7 on the right  and 0.71 on the left.  Toe pressure on the right was 85 and on the left  95.  Waveforms were biphasic bilaterally.   I discussed with the patient today that overall I believe his arterial  circulation is stable in character.  His iliac stents are patent by  clinical exam.  He does still have some claudication type symptoms but  these do not occur until walking at least a mile and do not seem to be  lifestyle  limiting.  Review of his arteriogram shows that primarily he  has tibial disease and I would not consider a percutaneous intervention  for this currently.  I believe the best option for now is to have him  continue walking and control of his atherosclerotic risk factors of  diabetes, hypertension as well as his cholesterol.  He will continue to  follow up in our surveillance protocol and have repeat ABIs in 1 year.  We will not continue to repeat the ultrasound of his stents since he  seems to always have overlying bowel gas and this has not been a very  beneficial study for [place].     Janetta Hora. Fields, MD  Electronically Signed   CEF/MEDQ  D:  02/13/2010  T:  02/13/2010  Job:  [id number]   cc:   Gwen Pounds, MD  Noralyn Pick. Eden Emms, MD, [facility]

## 2011-04-21 NOTE — Discharge Summary (Signed)
NAME:  [name] [name], [name] [name] NO.:  [id number]   MEDICAL RECORD NO.:  [id number]          PATIENT TYPE:  INP   LOCATION:  2034                         FACILITY:  [facility]   PHYSICIAN:  Janetta Hora. Fields, MD  DATE OF BIRTH:  1946/02/05   DATE OF ADMISSION:  05/14/2007  DATE OF DISCHARGE:                               DISCHARGE SUMMARY   FINAL DIAGNOSIS:  Greater than 90% left external iliac artery stenosis.   SECONDARY DIAGNOSES:  1. Status post St. Jude aortic valve replacement.  2. Status post splenectomy, idiopathic thrombocytopenia purpura.  3. Status post right common iliac artery stenting.   IN-HOSPITAL OPERATIONS/PROCEDURES:  Aortogram of bilateral lower  extremity runoff with placement of a left external iliac stent,  OmniLink.   HISTORY AND PHYSICAL AND HOSPITAL COURSE:  Mr. Nabor is an 65-year-  old male who has a past medical history of aortic valve replacement, St.  Jude valve, as well as history of peripheral arterial disease status  post right common iliac artery stenting done.  Patient recently seen by  Dr. Darrick Penna with complaints of left claudication symptoms.  Patient was  seen by Dr. Darrick Penna in the office, had ABIs done which showed on the left  to be 0.75, on the right to be 0.84.  Prior ABIs were bilateral greater  than 1.  Dr. Darrick Penna discussed with this patient undergoing lower  extremity arteriography and potential placement of stent.  Risks and  benefits were discussed with patient.  Patient acknowledged  understanding and agreed to proceed.  The arteriogram was scheduled for  May 17, 2007.  Due to patient being on chronic Coumadin for his aortic  valve replacement, he will require admission to the hospital prior to  undergoing arteriogram for conversion of Coumadin and place the patient  on heparin.  Patient agreed to proceed.  For details of the patient's  past medical history and physical exam, please see dictated H&P.   Patient was admitted  to [facility] May 14, 2007.  The patient's  Coumadin was discontinued, and he was initiated on heparin IV per  Pharmacy.  Heparin was not initiated at first due to INR being greater  than 2.  On May 15, 2007, patient's INR was 2--heparin was never  started, and the order for it was discontinued June 8 and switched to  Lovenox 75 mg subcu q.12h.  Patient's INR on June 10 was 1.1.  He was  taken to [facility] lab where he underwent aortogram of bilateral lower  extremity runoff.  This showed greater than 98% left external iliac  artery stenosis.  Dr. Darrick Penna placed a left external iliac OmniLink stent  at that time.  The patient tolerated this procedure well and was  transferred back to 2000 in stable condition.  Patient will be restarted  on Coumadin today, May 17, 2007.  We will continue to obtain daily PT  and INR levels.  Patient's vital signs were monitored during his  hospital stay and are noted to be stable.  He is afebrile, saturating  greater than 98% on room air.  Patient remained in normal sinus rhythm.  Pulmonary status is stable.  Patient was out of bed and ambulating  without difficulty prior to discharge.  After placement of the stent,  patient's bilateral feet were warm and well perfused with adequate blood  flow.   Patient is tentatively ready for discharge home May 18, 2007 pending  INR level.   FOLLOW-UP APPOINTMENTS:  The follow-up appointment will be arranged with  Dr. Darrick Penna for in 1 month.  Our office will contact patient with this  information.   ACTIVITY:  Patient instructed no driving, no heavy lifting for about 1  week.  He is to ambulate 4 times per daily, progress as tolerated, and  continue his breathing exercises.   INCISIONAL CARE:  Patient is told to wash the area using soap and water.  He is to contact the office if he develops any significant swelling or  tenderness over arteriogram site.   DIET:  Patient to continue on a diet to be low fat,  low salt,  carbohydrate modified medium calorie diet.   DISCHARGE MEDICATIONS:  1. Coumadin 7.5 mg daily.  2. Lipitor 40 mg daily.  3. Glyburide and metformin 2.5/500 mg 2 tabs in the a.m. and 1 tab in      the p.m.  4. HCTZ 12.5 mg daily.  5. Toprol-XL 50 mg daily.  6. _______8 mg daily.  7. Tylenol 650 one to two tabs q.4-6h. p.r.n. pain.      Theda Belfast, PA      Janetta Hora. Fields, MD  Electronically Signed    KMD/MEDQ  D:  05/17/2007  T:  05/18/2007  Job:  [id number]

## 2011-04-21 NOTE — Op Note (Signed)
NAME:  KIVON, APREA NO.:  [id number]   MEDICAL RECORD NO.:  [id number]          PATIENT TYPE:  INP   LOCATION:  2034                         FACILITY:  [facility]   PHYSICIAN:  Janetta Hora. Fields, MD  DATE OF BIRTH:  November 30, 1946   DATE OF PROCEDURE:  05/17/2007  DATE OF DISCHARGE:  05/18/2007                               OPERATIVE REPORT   PROCEDURE:  1. Aortogram with lower extremity runoff.  2. Angioplasty and stenting of the left external iliac artery.   PREOPERATIVE DIAGNOSIS:  Claudication of the left lower extremity.   POSTOPERATIVE DIAGNOSIS:  Claudication of the left lower extremity.   ANESTHESIA:  Local.   OPERATIVE DETAILS:  After obtaining informed consent, the patient was  taken to the PV lab.  The patient was placed in the supine position on  the angio table.  Next, local anesthesia was infiltrated over the right  common femoral artery.  A Majestic needle was used to cannulate the  right common femoral artery and a 0.035 Wholey wire advanced up into the  abdominal aorta under fluoroscopic guidance.  Next, a 5 French sheath  was placed over the guidewire in the right femoral artery.  A 5 French  pigtail catheter was then placed over the guidewire and abdominal  aortogram obtained.  This shows bilateral patency of the renal arteries.  There are two renal arteries on the right side and early bifurcation of  the left renal artery.  There is no significant stenosis.  The aorta has  some tapering at its distal segment.  There is no occlusive stenosis.  There is a right common iliac stent which is patent with mild  restenosis, approximately 25%.  The left common iliac artery is widely  patent.  There is a high grade stenosis of the left external iliac  artery at the iliac bifurcation.  The left internal iliac artery is  small but patent.  The right external iliac artery is patent.  The right  internal iliac artery is also small but patent.  There is  moderate  atherosclerotic change of the right external iliac artery but no  significant flow limiting stenosis.   The pigtail catheter was then pulled down near the aortic bifurcation  and magnified views were obtained.  This again shows patency of the  right and left common iliac systems with a high grade left external  iliac artery stenosis.  These were also obtained in a 30 degrees RAO and  LAO oblique view.  The LAO oblique view shows one area of 50% stenosis  in the distal common iliac artery on the right side.  This does not  appear flow limiting.  This was distal to the previously placed right  common iliac artery stent.  The oblique view from a RAO viewpoint shows,  again, a high grade stenosis of the origin of the left external iliac  artery.   Next, lower extremity runoff view was obtained.  There is some  atherosclerotic plaque in the left common femoral artery layering along  posterior wall.  The left and right profunda and superficial  femoral  arteries are patent. There is mild to moderate atherosclerotic change of  the right superficial femoral artery in its mid section but no flow  limiting stenosis.  The popliteal arteries are patent bilaterally.  Peak  hold views were obtained of the knees on both sides.  This shows a  stenosis at the origin of the left anterior tibial artery but this is  patent distally.  The tibioperoneal trunk is patent.  There is a high  grade stenosis of the origin of the peroneal artery on the left.  The  posterior tibial artery is patent.  An additional peak hold view was  obtained of the distal leg.  This shows the posterior tibial artery to  be the dominant runoff vessel in the left leg.  There is minimal filling  of the peroneal artery distally.  The dorsalis pedis artery also appears  occluded.   On the right leg, the anterior tibial artery is patent with an 80%  narrowing proximally.  The tibioperoneal trunk is patent.  There is a  high  grade stenosis of the origin of the peroneal artery.  The posterior  tibial artery is patent.  On the peak hold view of the calf, the primary  runoff vessel, again, is the posterior tibial artery.   At this point, the 5 French sheath was exchanged in the right femoral  artery for a 7 French long sheath.  This was advanced with moderate  difficulty up and over the aortic bifurcation due to calcification in  this region.  A 0.035 Glidewire was then used to further advance the  catheter up and over the bifurcation.  After this had been advanced a  significant distance, this was exchanged for an [employer] wire.  With  this, I was able to get the sheath advanced down into the left common  iliac system.  The patient was then given 5000 units of intravenous  heparin before crossing the lesion with an 035 Wholey wire.  The  external iliac lesion was then initially angioplastied with a 5 x 3  balloon to 10 atmospheres for 66 seconds.  A ruler was used to guide  placement of this balloon and the stenting.  After angioplasty with a 5  mm balloon, it was much easier to cross the lesion for placement of an  external iliac stent.  A 7 x 28 external iliac stent was brought up on  the operative field and this was deployed just below the iliac  bifurcation on the left side.  Completion arteriogram was performed.  This showed some mild narrowing of the left internal iliac artery but  this was a fairly diseased vessel prior to stenting.  The left external  iliac artery was widely patent.  There was no evidence of dissection.   At this point, the sheaths and guidewires were pulled back.  The long 7  French sheath was exchanged for a short 7 [place] sheath.  This was left  in place to be pulled in the holding area when the ACT was at a  reasonable level.  The patient tolerated the procedure well and there  were no complications.  The patient was taken to the holding area in  stable condition.       Janetta Hora. Fields, MD  Electronically Signed     CEF/MEDQ  D:  06/01/2007  T:  06/01/2007  Job:  [id number]

## 2011-04-21 NOTE — Procedures (Signed)
AORTA-ILIAC DUPLEX EVALUATION   INDICATION:  Followup iliac stents with increased claudication R>L.   HISTORY:  Diabetes:  Yes.  Cardiac:  Yes.  Hypertension:  No.  Smoking:  No.  Previous Surgery:  Left EIA PTA / stent 05/17/2007, right CIA PTA /  stent 07/10/2005.  Both by Dr. Darrick Penna.               SINGLE LEVEL ARTERIAL EXAM                              RIGHT                  LEFT  Brachial:                  178                    175  Anterior tibial:           Noncompressible (biphasic)  Noncompressible (biphasic)  Posterior tibial:          144 (biphasic)         160 (biphasic)  Peroneal:  Ankle/brachial index:      Noncompressible        Noncompressible  Previous ABI/date:         08/09/2008 1.15        08/09/2008 1.48   AORTA-ILIAC DUPLEX EXAM  Aorta - Proximal     61 cm/s  Aorta - Mid          57 cm/s  Aorta - Distal       71 cm/s   RIGHT                                   LEFT  Not visualized    CIA-PROXIMAL          Not visualized  Not visualized    CIA-DISTAL            Not visualized  Not visualized    HYPOGASTRIC           Not visualized  Not visualized    EIA-PROXIMAL          Not visualized  Not visualized    EIA-MID               Not visualized  112 cm/s          EIA-DISTAL            171 cm/s   IMPRESSION:  1. Aorta and distal external iliac arteries appears within normal      limits with strong biphasic waveforms.  2. Ankle brachial indices appear invalid due to noncompressible      vessels, bilateral ankle waveforms all appear biphasic.  3. Right toe brachial index = 0.61, left toe brachial index = 0.77.  4. Note, limited visualization due to very excessive acoustic      shadowing due to bowel gas.   ___________________________________________  Janetta Hora. Fields, MD   AS/MEDQ  D:  08/14/2009  T:  08/14/2009  Job:  [id number]

## 2011-04-21 NOTE — Assessment & Plan Note (Signed)
[facility]                            CARDIOLOGY OFFICE NOTE   NAME:[name] [name]                      MRN:          [id number]  DATE:10/26/2007                            DOB:          [birth year]    [name] [name] returns today for followup.  He had multiple issues to discuss  today.  He is 65 years old.  He is status post aortic valve replacement,  I believe in 2004.  He has been doing fairly well.  He had a CABG times  three with his aortic valve replacement.  His last Myoview in May of  2008 was non-ischemic.  When I last saw him, he was having significant  recurrent claudication in his legs and subsequently underwent left iliac  stenting by Dr. Darrick Penna in June.  He has had a very nice response to this  with elimination of his claudication.  He needs followup ABIs.  In  regards to his aortic valve, he has been doing well.  Echocardiography  has been normal with residual good LV function.   The patient's Coumadin level has been somewhat difficult to control.  His INR, checked today in the office, was 6.1 and he will have to have  an IV stick to check this.  There is no bleeding diathesis and I talked  to him a little bit and I do not think he needs vitamin K at this time.   His coronary risk factors include hypercholesterolemia.  We have had him  on Lipitor with an LDL cholesterol of 66.  He is also a diabetic and I  do not have a recent hemoglobin A1c on him.   The patient has been doing fairly well.  He works for [employer] and business is a little slow.  He is a little bit stressed  about this, but otherwise doing well.   REVIEW OF SYSTEMS:  Negative.  In particular, he has had resolution of  his claudication.  There has been no TIA or CVA, no bleeding diathesis  on Coumadin and no chest pain or dyspnea.   MEDICATIONS INCLUDE:  1. Mavik 8 mg a day.  2. Toprol 50 a day.  3. Hydrochlorothiazide 12.5 a day.  4. Coumadin as directed.  5.  Lipitor 40 a day.  6. Metformin 2.5/500.   EXAM:  Remarkable for a healthy-appearing, middle-aged white male, in no  distress.  Blood pressure is 130/70, pulse 70 and regular, afebrile, respiratory  rate 14.  Affect appropriate.  Pulse 70 and regular.  HEENT:  Unremarkable.  Carotids normal without bruit, lymphadenopathy, thyromegaly or JVP  elevation.  LUNGS:  Clear with good diaphragmatic motion, no wheezing.  S1, S2 click with a soft systolic murmur, no diastolic murmur.  PMI  normal.  ABDOMEN:  Benign.  Bowel sounds positive.  No AAA, no hepatosplenomegaly  no hepatojugular reflux.  Distal pulses are intact.  He has bilateral femoral bruits.  His PTs are  +2 though.  Manual ankle brachial indexes were greater than 0.85  bilaterally.  SKIN:  Warm and dry.   I reviewed his  lab work that was recently done and his LDL cholesterol  was excellent.  The last stent post ABIs, done May 13, 2007, showed  right-sided ABI at 0.84 and the left at 0.75.   IMPRESSION:  1. Aortic valve replacement sounds normal.  Good function by echo.      Continue Coumadin.  2. Elevated anticoagulation.  I went over his diet.  He is really not      changing it very much.  I think he is just a difficult person to      regulate in regards to how many clotting factors he makes daily.      He will have an IV pro-time today.  If this is significantly      elevated above 8, I will give him vitamin K. I suspect also that      his alcohol intake has something to do with problems regulating his      INR.  3. Hypercholesterolemia.  Continue statin drug.  LDL 66 good target      range.  4. Hypertension.  Currently under good control.  Continue low-salt      diet, limit alcohol intake.  Continue current dose of Mavik.  5. Claudication.  Improved, status post stent to the left iliac.  I      believe he has had a previous stent to the right iliac, but I will      have to look.  His ABIs are in a good range.  These  will be      followed up in six months.  6. Health screening.  The patient needs colonoscopy.  I told him he      could probably stop his Coumadin without overlap of heparin and      Lovenox, since he is relatively young and has an aortic valve.      However, I think it would be preferable for him to possibly have a      CT colonography with Dr. Molli Posey.  He will talk to Dr. Jarold Motto      about this.     Noralyn Pick. Eden Emms, MD, [facility]  Electronically Signed    PCN/MedQ  DD: 10/26/2007  DT: 10/26/2007  Job #: [id number]

## 2011-04-21 NOTE — Assessment & Plan Note (Signed)
OFFICE VISIT   [name] [name], [name] [name]  DOB:  [birth year]                                       05/13/2007  CHART#:[id number]   HISTORY AND PHYSICAL FOR THE CHART   The patient is a 65 year old male with known lower extremity arterial  occlusive disease.  He had angioplasty and stenting of his right common  iliac artery in 07/2005.  In 07/2005 his ABIs were greater than 1.  He  has had slow progressive decline in his ABIs since that time.  Most  recent ones today were 0.75 on the left and 0.84 on the right.  He has  developed progressive claudication symptoms since that time.  The left  leg is the worse.  He states he gets claudication symptoms at  approximately 20 feet.  He denies any current tobacco use.  His  atherosclerotic risk factors include coronary artery disease status post  CABG.  He also has a history of hypertension, diabetes, elevated  cholesterol.  He is a former smoker and quit 12 years ago.   PAST MEDICAL HISTORY:  Remarkable for ITPs.   PAST SURGICAL HISTORY:  He had a splenectomy for ITP. He had aortic  valve replacement and coronary artery bypass grafting by Dr. Laneta Simmers in  2004.  At that time the right saphenous vein was found to be small and  the left saphenous vein was used for his bypass grafts.  He is on  chronic Coumadin for prostatic aortic valve.   SOCIAL HISTORY:  Is as mentioned above.   MEDICATIONS:  1. Mavik 8 mg once a day  2. Hydrochlorothiazide 12.5 mg once a day  3. Glyburide 2.5/500 two tablets in the morning, one in the evening  4. Toprol 50 mg once a day  5. Coumadin 7.5 mg once a day  6. Lipitor 50 mg once a day   PHYSICAL EXAMINATION:  VITAL SIGNS:  Blood pressure 132/70, heart rate  76 and regular.  HEENT:  Unremarkable.  He has 2+ carotid pulses without bruits.  CHEST:  Clear to auscultation.  CARDIAC EXAM:  Regular rate and rhythm.  ABDOMEN:  Soft, nontender, nondistended.  EXTREMITIES:  He has 2+ femoral  pulses with absent popliteal and pedal  pulses bilaterally.  Feet are warm, pink, and well perfused.  There are  no ulcerations.   The patient has lifestyle limiting claudication.  He wishes to have  further intervention at this point.  I discussed with him lower  extremity arteriography as well as risk, benefits, and possible  complications of angioplasty and stenting of his superficial femoral  artery.  There is also potential possibility that he might require  bypass to relieve his symptoms.  We will admit him on Saturday,  05/14/2007, to convert his Coumadin over to Heparin.  He will stop his  Coumadin today.  His arteriogram is tentatively scheduled for 06/10 if  his Coumadin level is lower sufficiently.   Janetta Hora. Fields, MD  Electronically Signed   CEF/MEDQ  D:  05/13/2007  T:  05/16/2007  Job:  [id number]   cc:   Gwen Pounds, MD

## 2011-04-21 NOTE — H&P (Signed)
NAME:  JOSEDANIEL, HAYE NO.:  [id number]   MEDICAL RECORD NO.:  [id number]           PATIENT TYPE:   LOCATION:                                 FACILITY:   PHYSICIAN:  Charles E. Fields, MD       DATE OF BIRTH:   DATE OF ADMISSION:  05/14/2007  DATE OF DISCHARGE:                              HISTORY & PHYSICAL   REASON FOR ADMISSION:  Left lower extremity  claudication.   HISTORY OF PRESENT ILLNESS:  The patient is a 65 year old male who is  status post right common iliac artery stenting in August 2006.  His  claudication symptoms immediately improved after this.  His ABIs were  greater than 1 bilaterally.  Since that time, he has had progressive  decrease in bilateral ABIs and progressive increase in claudication  symptoms.  He states that now he can only walk about 20 yards before  developing claudication symptoms in the left leg.  He is a remote  tobacco user but is not using currently.  He has a history of coronary  artery disease and is status post coronary artery bypass grafting by Dr.  Laneta Simmers several years ago.  He also had aortic valve replacement with a  prosthetic valve and is on chronic Coumadin for this.  His other  atherosclerotic risk factors include hypertension, diabetes, and  elevated cholesterol.  He denies any history of rest pain or tissue loss  currently.   PAST MEDICAL HISTORY:  ITP.   PAST SURGICAL HISTORY:  1. Splenectomy for ITP.  2. Aortic valve.  3. CABG as described above.  At the time of his coronary artery bypass      grafting, his right saphenous vein was noted to be small.  The left      leg vein was used for the bypass procedure.   SOCIAL HISTORY:  As mentioned above.   CURRENT MEDICATIONS:  1. Mavik 8 mg once a day.  2. Hydrochlorothiazide 12.5 mg once a day.  3. Glyburide/Metformin 2.5/500, two tablets in the morning and one in      the evening.  4. Toprol 50 mg once a day.  5. Coumadin 7.5 mg once a day.  6. Lipitor  50 mg once a day.   He has no known drug allergies.   FAMILY HISTORY:  Unremarkable.   REVIEW OF SYSTEMS:  He denies any history of stroke, TIA, shortness of  breath, chest pain, or DVT.   PHYSICAL EXAMINATION:  VITAL SIGNS:  Blood pressure is 132/70, heart  rate is 76 and regular.  HEENT:  Unremarkable.  He has 2+ carotid pulses without bruit.  CHEST:  Clear to auscultation.  CARDIAC:  Regular rate and rhythm.  ABDOMEN:  Soft, nontender, nondistended.  He has 2+ femoral pulses  bilaterally.  EXTREMITIES:  He has absent popliteal and pedal pulses bilaterally.  Feet are pink, warm, and adequately perfused.  NEUROLOGIC:  Motor and sensory exam for his lower and upper extremities  is intact.   He had ABIs performed today which were 0.75 on the left and 0.84 on  the  right.  The wave forms were biphasic bilaterally but more towards the  triphasic end on the right and monophasic on the left.   Mr. Sibal has progressive claudication symptoms.  I believe the best  option for him is repeat arteriogram, possibly angioplasty and stenting  of his lower extremity vessels.  I discussed with him the procedure  risks, benefits, and possible complications.  We will admit him on June  7th to convert him from Coumadin over to heparin and then plan his  arteriogram for Tuesday, May 17, 2007.  His previous arteriogram showed  that the superficial femoral and profundis arteries were patent  bilaterally as well as the popliteal arteries.  His lower extremity  tibial vessels were also patent except for the anterior tibial artery  which appeared occluded.  We will see if there is any comparison on the  new arteriogram.      Janetta Hora. Fields, MD  Electronically Signed     CEF/MEDQ  D:  05/13/2007  T:  05/13/2007  Job:  [id number]

## 2011-04-21 NOTE — Procedures (Signed)
AORTA-ILIAC DUPLEX EVALUATION   INDICATION:  Follow up iliac stents.   HISTORY:  Diabetes:  Yes.  Cardiac:  Yes.  Hypertension:  Yes.  Smoking:  No.  Previous Surgery:  Left external iliac artery PTA/stent on 06/08, right  common iliac PTA/stent, 08/06, by Dr. Darrick Penna.               SINGLE LEVEL ARTERIAL EXAM                              RIGHT                  LEFT  Brachial:                  180                    137  Anterior tibial:           Noncompressible        Noncompressible  Posterior tibial:          125                    167  Peroneal:  Ankle/brachial index:      Noncompressible        Noncompressible  Previous ABI/date:         08/24/09, noncompressible  08/24/09, noncompressible   AORTA-ILIAC DUPLEX EXAM  Aorta - Proximal     Not visualized  Aorta - Mid          Not visualized  Aorta - Distal       Not visualized   RIGHT                                   LEFT  Not visualized    CIA-PROXIMAL          Not visualized  Not visualized    CIA-DISTAL            Not visualized                    HYPOGASTRIC  Not visualized    EIA-PROXIMAL          Not visualized  144 cm/s          EIA-MID               162 cm/s  126 cm/s          EIA-DISTAL            144 cm/s   IMPRESSION:  1. This is a very technically difficult study.  2. The patient's images were limited due to bowel gas.  3. Bilateral ankle brachial indices are not compressible with biphasic      waveforms.   ___________________________________________  Janetta Hora Fields, MD   CB/MEDQ  D:  02/12/2010  T:  02/12/2010  Job:  [id number]

## 2011-04-21 NOTE — Procedures (Signed)
LOWER EXTREMITY ARTERIAL EVALUATION-SINGLE LEVEL   INDICATION:  Followup left iliac artery PTA and stent.   HISTORY:  Diabetes:  Yes.  Cardiac:  Coronary artery disease.  Hypertension:  No.  Smoking:  No.  Quit 14 years ago.  Previous Surgery:  Left common iliac artery PTA and stent in June 2008,  by Dr. Darrick Penna.   RESTING SYSTOLIC PRESSURES: (ABI)                          RIGHT                LEFT  Brachial:               140.                 140.  Anterior tibial:        154 (greater than 1.0).                   190  (greater than 1.0)  Posterior tibial:       122.                 122.  Peroneal:  DOPPLER WAVEFORM ANALYSIS:  Anterior tibial:        Triphasic.           Triphasic.  Posterior tibial:       Triphasic.           Triphasic.  Peroneal:   PREVIOUS ABI'S:  Date: 05/13/2007.  RIGHT:  0.84.  LEFT:  0.75.   IMPRESSION:  Bilateral ABIs are improved from previous study.   ___________________________________________  Janetta Hora Darrick Penna, MD   DP/MEDQ  D:  08/31/2007  T:  09/01/2007  Job:  [id number]

## 2011-04-22 ENCOUNTER — Other Ambulatory Visit: Payer: Self-pay | Admitting: *Deleted

## 2011-04-22 DIAGNOSIS — Z9081 Acquired absence of spleen: Secondary | ICD-10-CM | POA: Insufficient documentation

## 2011-04-22 MED ORDER — TRANDOLAPRIL 4 MG PO TABS: 4.0000 mg | ORAL_TABLET | Freq: Every day | ORAL | Status: DC

## 2011-04-23 ENCOUNTER — Other Ambulatory Visit: Payer: Self-pay | Admitting: *Deleted

## 2011-04-23 MED ORDER — TRANDOLAPRIL 4 MG PO TABS: 4.0000 mg | ORAL_TABLET | Freq: Every day | ORAL | Status: DC

## 2011-04-24 NOTE — Discharge Summary (Signed)
NAME:  [name] [name], [name] [name]                   ACCOUNT NO.:  [id number]   MEDICAL RECORD NO.:  [id number]                   PATIENT TYPE:  INP   LOCATION:  0270                                 FACILITY:  [facility]   PHYSICIAN:  Pierce Crane, M.D.                   DATE OF BIRTH:  1946/10/29   DATE OF ADMISSION:  02/05/2003  DATE OF DISCHARGE:  02/09/2003                                 DISCHARGE SUMMARY   PROBLEM:  Idiopathic thrombocytopenia.   IDENTIFICATION:  The patient is a 65 year old gentleman from [place].   HISTORY OF PRESENT ILLNESS:  This gentleman presented with thrombocytopenia  about six weeks ago.  He was at the time traveling in [place].  He had a  bone marrow biopsy which was essentially unremarkable.  His platelet counts  were initially  2000.  He was going to transfer to steroids.  He appears to  be fairly steroid dependent and requiring high dose of Solu-Medrol.  He was  brought into the hospital for IVIG.  He was given high-dose Decadron on  January 30, 2003.   Fortunately, his platelet count transiently responded to 38,000, but he also  began to have more problems with cramps and difficulty controlling his blood  sugars.   PAST MEDICAL HISTORY:  His past medical history was significant for  hypertension and type 2 diabetes, history of previous aortic stenosis and  sclerosis.   PHYSICAL EXAMINATION:  VITAL SIGNS:  On initial evaluation, blood pressure  was 147/85, temperature 99, pulse 87, respiratory rate 20.  HEAD AND NECK:  No adenopathy in the head and neck area.  Trachea midline.  No thyromegaly.  LUNGS:  Lungs clear.  CARDIAC:  Exam is unremarkable.  ABDOMEN:  No intra-abdominal organomegaly or mass.   LABORATORY DATA:  White count 7.9, hemoglobin of 13, platelet count of  15,000.   COURSE IN THE HOSPITAL:  The patient was admitted to the hospital and given  IVIG at a dose of 1 g per kg for three consecutive days.  He was seen in  consultation  by Dr. Vikki Ports for consideration of splenectomy.  The patient had received his vaccinations prior to this admission.  He was  also followed by Dr. Gwen Pounds, his primary care doctor, for management  of his blood sugars.  He did have some fevers related to IVIG but tolerated  it otherwise well.  On February 08, 2003, platelets had risen to 59,000 and on  February 09, 2003, they had risen to 102,000.  He seemed to do well with his  IVIG.  It was felt that he could be discharged when stable, for discharge on  February 09, 2003, and to be followed by Dr. Luan Pulling, who made arrangements  for his splenectomy.   DISCHARGE MEDICATIONS:  1. Protonix 40 mg a day.  2. Lipitor 10 mg a day.  3. Altace 5 mg daily.  4. Glyburide  5 mg p.o. in a.m. and 2.5 mg q.p.m.  5. Glucophage 500 mg b.i.d.  6. Insulin sliding scale.  7. Prednisone 10 mg a day.  8. Lantus 40 units at h.s.   FOLLOWUP:  He will have followup with Dr. Timothy Lasso for management of his blood  sugars and taper of his prednisone.   CONDITION ON DISCHARGE:  Patient discharged from the hospital in stable  condition.                                               Pierce Crane, M.D.    PR/MEDQ  D:  03/21/2003  T:  03/22/2003  Job:  [id number]   cc:   Vikki Ports, M.D.  [address]  [place]  [place] [address]  Fax: [phone]   Gwen Pounds, M.D.  [address]  [place]  [place] [address]  Fax: [phone]

## 2011-04-24 NOTE — Op Note (Signed)
NAME:  [name] [name], [name] [name]                         ACCOUNT NO.:  [id number]   MEDICAL RECORD NO.:  [id number]                   PATIENT TYPE:  INP   LOCATION:  2304                                 FACILITY:  [facility]   PHYSICIAN:  Evelene Croon, M.D.                  DATE OF BIRTH:  1946/07/24   DATE OF PROCEDURE:  11/07/2003  DATE OF DISCHARGE:                                 OPERATIVE REPORT   PREOPERATIVE DIAGNOSES:  Severe aortic stenosis and three-vessel coronary  artery disease.   POSTOPERATIVE DIAGNOSES:  Severe aortic stenosis and three-vessel coronary  artery disease.   OPERATION PERFORMED:  Median sternotomy, extracorporeal circulation,  coronary artery bypass grafting surgery times three using a left internal  mammary artery graft to the left anterior descending coronary artery, with a  sequential saphenous vein graft to the posterior descending and  posterolateral branches of the right coronary artery.  Aortic valve  replacement using a 23 mm St. Jude Regent mechanical valve.  Endoscopic vein  harvesting from the left leg.   SURGEON:  Alleen Borne, M.D.   ASSISTANT:  Pecola Leisure, PA   ANESTHESIA:  General endotracheal.   INDICATIONS FOR PROCEDURE:  The patient is a 65 year old gentleman with a  history of severe aortic stenosis.  He has had an echocardiogram done in  March of 2004 which showed an ejection fraction of 55 to 65% with a mean  gradient of 50 mmHg.  An echocardiogram in August 2004 showed an ejection  fraction of 70 to 75% with a peak gradient of 70 and a mean gradient of 36.  His mitral valve was normal.  There was some question of whether the patient  had a bicuspid aortic valve.  He has recently had episodes of  lightheadedness.  He underwent  cardiac catheterization on October 23, 2003  in anticipation of needing aortic valve replacement.  Cardiac  catheterization showed three-vessel coronary artery disease. The LAD had at  least 60% proximal to  midstenosis.  The left circumflex was small.  There  was a 50% midleft circumflex stenosis.  All three marginal branches were  small.  There was a medium-sized intermediate branch that had no significant  stenosis.  The right coronary artery had about 70% midvessel stenosis and  was diffusely diseased.  There was also about 50% stenosis of the  posterolateral branch.  Pulmonary artery and right-sided heart pressures  were normal.  After review of the angiogram and echocardiogram and  examination of the patient, it was felt that coronary artery bypass graft  surgery and aortic valve replacement was the best treatment.  Given his  relatively young age, I felt that a mechanical valve would be the best  choice for him. He did have a history of ITP with severe thrombocytopenia  last winter.  He subsequently underwent splenectomy in March of 2004 and has  had no problems with his  platelet count since.  I discussed this with his  oncologist, Pierce Crane, M.D. and he felt there would be no contraindication  to anticoagulation with Coumadin.  I discussed the operative procedure with  the patient and his wife including use of a mechanical valve and the need  for lifelong anticoagulation.  I discussed alternatives to surgery,  benefits, and risks including bleeding, possible blood transfusion,  infection, stroke, myocardial infarction and death.  They understood and  agreed to proceed.   DESCRIPTION OF PROCEDURE:  The patient was taken to the operating room and  placed on the table in supine position.  After induction of general  endotracheal anesthesia, a Foley catheter was placed in the bladder using  sterile technique.  Then the chest, abdomen and both lower extremities were  prepped and draped in the usual sterile manner.  Transesophageal  echocardiogram was performed by anesthesiology.  This showed a bicuspid  aortic valve with severe calcification of the valve and the annulus.  There  was  severe aortic stenosis.  There was no mitral regurgitation.  There was  severe left ventricular hypertrophy.   The chest was opened through a median sternotomy incision and the  pericardium opened in the midline.  Examination of the heart showed good  ventricular contractility.  The ascending aorta had some palpable plaque  present on the right lateral surface.   Then the left internal mammary artery was harvested from the chest wall as a  pedicle graft.  This was a medium caliber vessel with excellent blood flow  through it.  At the same, a segment of greater saphenous vein was harvested  from the left leg using endoscopic vein harvest technique.  I initially  examined the vein adjacent to the right knee but this vein was small and not  felt to be suitable.   Then the patient was heparinized and when an adequate activated clotting  time was achieved, the distal ascending aorta was cannulated using a 20  French aortic cannula for arterial inflow.  Venous outflow was achieved  using a two-stage venous cannula for the right atrial appendage.  An  antegrade cardioplegia and vent cannula was inserted in the aortic root.  Left ventricular vent was placed through the right ventricular pulmonary  vein and a retrograde cardioplegia through the right atrium and into the  coronary sinus.   The patient was placed on cardiopulmonary bypass and the distal coronary  arteries identified.  The LAD was a large graftable  vessel.  The  intermediate vessel had no significant disease palpable in it.  The obtuse  marginal branches were small, nongraftable vessels.  The right coronary  artery gave off a large posterior descending and large posterolateral  branch.  The posterior descending branch was diffusely disease with plaque.  The posterolateral branch appeared relatively free of disease.  The main body of the right coronary artery was diffusely diseased with calcific  plaque.   Then the aorta was  crossclamped and 500 mL of cold blood antegrade  cardioplegia was administered in the aortic root with quick arrest of the  heart.  Systemic hypothermia to 20 degrees centigrade and topical  hypothermia with iced saline was used.  A temperature probe was placed on  the septum and insulating pad in the pericardium.  Additional doses of  cardioplegia were given at about 30 minute intervals to maintain myocardial  temperature around 10 degrees centigrade.  This cardioplegia was given in a  retrograde manner.  Then the first distal anastomosis was performed to the posterior descending  coronary artery.  The internal diameter of this vessel was about 1.75 mm.  The conduit used was a segment of greater saphenous vein.  Anastomosis was  performed in a sequential side-to-side manner using continuous 7-0 Prolene  suture.  Flow was measured through the graft and was excellent.   The second distal anastomosis was performed to the posterolateral branch.  The internal diameter of this vessel was about 1.75 mm.  The conduit used  was the same segment of the greater saphenous vein.  The anastomosis was  performed in a sequential end-to-side manner using continuous 7-0 Prolene  suture.  Flow was measured through the graft and was excellent.  Then a dose  of retrograde cardioplegia was given.   Then the third distal anastomosis was performed to the midportion of the  left anterior descending coronary artery.  The internal diameter was about 2  mm.  The conduit used was the left internal mammary artery graft and this  was brought though an opening in the left pericardium anterior to the  phrenic nerve.  It was anastomosed to the LAD in end-to-side manner using  continuous 8-0 Prolene suture.  The anastomosis appeared hemostatic.  The  pedicle was tacked to the epicardium with 6-0 Prolene sutures.   Then another dose of retrograde cardioplegia was given.  Attention was then  turned aortic valve  replacement.  Additional doses of retrograde  cardioplegia were given at 30 minute intervals.  The aorta was opened  through a transverse incision about 1 cm above the take off of the right  coronary artery.  Examination of the aortic valve showed there was a  bicuspid valve with fusion of the commissure between the right coronary and  noncoronary leaflets.  The noncoronary leaflet was very small rudimentary  leaflet. There was severe calcification of the aortic valve leaflets as well  as the aortic annulus.  The native aortic valve was excised.  Care was taken  to remove all particulate debris.  Then the annulus was decalcified using  rongeurs.  The aortic root and left ventricle were irrigated with iced  saline solution.  The annulus was sized and a 23 mm St. Jude Regent  mechanical valve was chosen.  Then a series of pledgeted 2-0 Ethibond horizontal mattress sutures were placed around the annulus with the pledgets  in a subannular position.  These sutures were placed through the sewing ring  and the valve lowered into place.  The suture was tied sequentially and the  valve seated nicely.  The leaflets functioned normally.  The patient was  then rewarmed to 37 degrees centigrade.  The aorta was closed in two layers  using continuous 4-0 Prolene suture.  The single proximal vein graft  anastomosis was performed in aortic root in end-to-side manner using  continuous 6.0 Prolene suture.  Then the left side of the heart was deaired  and the head placed in Trendelenburg position.  The cross-clamp removed with  a time of 150 minutes.  There spontaneous return of a slow ventricular  escape rhythm which spontaneously converted to sinus rhythm.   The proximal and distal anastomoses appeared hemostatic and the line of the  graft satisfactory.  Graft markers were placed around the proximal  anastomoses.  Two temporary right ventricular and right atrial pacing wires  placed and brought out through  the skin.   When the patient had rewarmed to 37 degrees centigrade,  he was weaned from  cardiopulmonary bypass on low-dose dopamine.  Total bypass time was 178  minutes.  Cardiac function appeared excellent with a cardiac output of 5L  per minute.  Transesophageal echocardiogram was again performed by  anesthesiology and showed a normal functioning St. Jude aortic valve  prosthesis.  There was no evidence of perivalvular leak and the leaflets  appeared to be functioning normally.  There was no mitral regurgitation.  Left ventricular function was well preserved.  Protamine was given and the  venous and aortic cannulas were removed without difficulty.  Hemostasis was  achieved.  Four chest tubes were placed with bilateral pleural tubes, a tube  in the posterior pericardium, and one in the anterior mediastinum.  The  pericardium was easily approximated over the heart.  The sternum was closed  with #6 stainless steel wires.  The fascia was closed with continuous #1  Vicryl suture.  The subcutaneous tissue was closed with continuous 2-0  Vicryl and the skin with 3-0 Vicryl subcuticular closure.  The lower  extremity vein harvest site was closed in layers in a similar manner.  Sponge, needle and instrument counts were correct according to the scrub  nurse.  Dry sterile dressings were applied over the incisions, around the  chest tubes which were hooked to Pleur-evac suction.  The patient remained  hemodynamically stable and was transported to the SICU in guarded but stable  condition.                                               Evelene Croon, M.D.    BB/MEDQ  D:  11/07/2003  T:  11/08/2003  Job:  [id number]   cc:   Charlton Haws, M.D.

## 2011-04-24 NOTE — H&P (Signed)
NAME:  [name] [name], [name] [name]                   ACCOUNT NO.:  [id number]   MEDICAL RECORD NO.:  [id number]                   PATIENT TYPE:  INP   LOCATION:  0270                                 FACILITY:  [facility]   PHYSICIAN:  Pierce Crane, M.D.                   DATE OF BIRTH:  07-Aug-1946   DATE OF ADMISSION:  02/05/2003  DATE OF DISCHARGE:                                HISTORY & PHYSICAL   PROBLEM:  Idiopathic thrombocytopenic purpura.   PATIENT IDENTIFICATION:  The patient is a 65 year old gentleman from  [place].   HISTORY OF PRESENT ILLNESS:  This gentleman has been in good health all his  life.  He apparently was traveling to [place] over the holidays and began to  experience feeling unwell.  In retrospect, he had been feeling lightheaded  for about two weeks prior to his departure.  He was seen in the emergency  room because he was having black tarry stools and was noted to have a  hemoglobin of 8.2, platelet count 2000.  He subsequently had an EKG which  showed prepyloric erosions.  He had EKG of the stomach.  He was seen in  consultation by hematology locally.  Bone marrow biopsy on 01/11/2003 showed  normal bone marrow.  Adequate  megacaryocytes were seen.  He was treated  with 4 units of packed red blood cells. Subsequently hemoglobin improved to  10.3.  He was given Solu-Medrol 1 g IV daily for 3 days, and platelets rose  to 35,000, and subsequently platelets continued to rise to a high of 60,000  on 01/07/2003.  He was subsequently started on 100 mg prednisone a day.  He  was not given IVIG.  He came back to [place] on 01/13/2003.  On 01/14/2003,  his platelets were 56,000.  On 01/15/2003, platelets were53,000 and  subsequently had fallen to 26,000 on 01/17/2003.  He was continued on  prednisone 100 mg a day.  In our clinic, he was given WinRho on 01/18/2003 at  60 mg.  At that time, platelet count was 20,000.  Subsequently platelet  count rose to 54,000 but then began to fall  again.   He subsequently was given high-dose Decadron starting on 01/30/2003 at 20 mg  p.o. b.i.d.  Platelet counts rose from16 to a high of 38,000.  He returns  today with a platelet count of 15,000 after stopping high-dose Decadron and  not taking any more prednisone.  His only other complaint has been cramps in  his legs and his knees.   PAST MEDICAL HISTORY:  Other medical problems include a history of type 2  diabetes.  As such, he had been taking Glyburide and Glucophage, but since  then has been taking Humalog insulin as well as Lantus sliding scale because  of his elevated blood sugars.  He is also on Lipitor and Altace for  hypertension.  He has a previous history of aortic stenosis and  sclerosis,  history of sinusitis with sinus congestion.   PAST SURGICAL HISTORY:  Tonsillectomy.   SOCIAL HISTORY:  Includes 60-pack-year history of smoking, quit 8 years ago.  Admits to moderate quantities of alcohol.  He is on his second marriage.  He  has three adult children, all of whom live outside [place].  The  patient is originally from [place], [place]. 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   FAMILY HISTORY:  Known history of thrombocytopenia in the family.   REVIEW OF SYSTEMS:  Otherwise unremarkable.  He is not complaining of any  problems with bleeding.   PHYSICAL EXAMINATION:  GENERAL: Pleasant gentleman looking his stated age.  He remains anxious.  VITAL SIGNS:  Blood pressure 147/85, temperature 99, pulse 87, respiratory  rate 20. Weight 189.  HEENT AND NECK:  No adenopathy in the head or neck area.  Oropharynx normal.  SKIN:  No purpura is seen.  No petechiae seen on his lower extremities.  LUNGS:  Clear.  CARDIAC:  Unremarkable.  Systolic murmur heard.  ABDOMEN:  No organomegaly or mass.  EXTREMITIES:  No peripheral cyanosis, clubbing, or edema.   LABORATORY DATA:  White count 7.9, hemoglobin 13, platelet count 15,000.    IMPRESSION:  The patient has idiopathic thrombocytopenic purpura. We have  obtained his bone marrow slides which confirm that he has adequate bone  marrow reserves.  We plan to bring him into the hospital, start him on IVIG  for three consecutive days.  Consultation has been made with Dr. Luan Pulling  who hopefully can see him in the hospital and plan to perform splenectomy.  He has been given his pre-splenectomy vaccinations over the past two weeks.  We hope he responds to the IVIG and subsequently responds to splenectomy.  Will keep him on maintenance prednisone at 10 mg a day.  He did not have a  taper for his prednisone which he had been on for about two weeks prior to  being placed on high-dose Decadron.  We will follow him carefully in the  hospital with daily CBCs.                                               Pierce Crane, M.D.    PR/MEDQ  D:  02/05/2003  T:  02/05/2003  Job:  [id number]   cc:   Gwen Pounds, M.D.  [address]  [place]  [place] [address]  Fax: [phone]   Vikki Ports, M.D.  [address]  [place]  [place] [address]  Fax: [phone]

## 2011-04-24 NOTE — Op Note (Signed)
NAME:  [name] [name], [name] [name]                   ACCOUNT NO.:  [id number]   MEDICAL RECORD NO.:  [id number]                   PATIENT TYPE:  AMB   LOCATION:  DAY                                  FACILITY:  [facility]   PHYSICIAN:  Vikki Ports, M.D.         DATE OF BIRTH:  February 26, 1946   DATE OF PROCEDURE:  02/16/2003  DATE OF DISCHARGE:                                 OPERATIVE REPORT   PREOPERATIVE DIAGNOSES:  Idiopathic thrombocytopenic purpura.   POSTOPERATIVE DIAGNOSES:  Idiopathic thrombocytopenic purpura.   PROCEDURE:  Laparoscopic splenectomy.   SURGEON:  Vikki Ports, M.D.   ASSISTANT:  Adolph Pollack, M.D.   ANESTHESIA:  General.   DESCRIPTION OF PROCEDURE:  The patient was taken to the operating room,  placed in supine position. After adequate general anesthesia was induced  using endotracheal tube, the patient was placed in the modified right  decubitus position. Axillary roll was placed and patient was secured in  place with a beanbag. The left upper quadrant and abdomen were prepped and  draped in the normal sterile fashion. A 12 mm incision was made in the mid  clavicular line approximately 4 cm below the costal margin. The anterior  fascia was then grasped with a hook and Veress needle was introduced into  the abdomen. Pneumoperitoneum was obtained with continuous flow carbon  dioxide. Under direct visualization, a 5 mm port was placed just left of the  xiphoid process and an additional 12 mm port was placed in the anterior  axillary line and an 11 mm trocar was placed in the posterior clavicular  line. This was all accomplished under direct visualization. The spleen was  inspected. The abdomen was also inspected and there was no evidence of  splenosis. The attachments to the spleen at the transverse colon to the  diaphragm and to the retroperitoneum were taken down using the harmonic  scalpel. It was an extensive dissection to mobilize the  spleen. It had  significant attachments to the diaphragm to the left kidney and to the  transverse mesocolon. After these attachments were taken down, short gastric  vessels were taken down at the most superior portion of the greater  curvature of the stomach. All of this was accomplished with the harmonic  scalpel. After the spleen had been completely mobilized and could be  retracted anteriorly, the entire hilum was inspected. There was a small  accessory inferior pole artery which was divided using an auto suture  vascular stapler. After the entire hilum could be been visualized and the  tail of the pancreas had been protected, the hilum was divided using a 60  mm, 2.5 auto suture GIA stapling device. Adequate hemostasis was ensured,  the left upper quadrant was copiously irrigated. The spleen was then placed  in a reinforced bag and morcellated with a ring forceps. This was done under  direct visualization to ensure no injury to intraabdominal organs. The  morcellated spleen was then removed.  Again the left upper quadrant was  copiously irrigated and adequate hemostasis was ensured. The stomach was  inspected as was the transected  splenic hilum and all staple lines appeared secure. The fascial defects were  then closed with interrupted #0 Vicryl sutures. The skin incisions were  closed with staples. All incisions were injected using Marcaine. The patient  tolerated the procedure well and was taken to PACU in good condition.                                               Vikki Ports, M.D.    KRH/MEDQ  D:  02/16/2003  T:  02/16/2003  Job:  [id number]   cc:   Pierce Crane, M.D.  [address] [facility]  [place]  [place] [address]  Fax: [phone]   Gwen Pounds, M.D.  [address]  [place]  [place] [address]  Fax: [phone]

## 2011-04-24 NOTE — H&P (Signed)
NAME:  [name] [name], [name] [name] NO.:  [id number]   MEDICAL RECORD NO.:  [id number]          PATIENT TYPE:  OIB   LOCATION:                               FACILITY:  [facility]   PHYSICIAN:  Janetta Hora. Fields, MD  DATE OF BIRTH:  07-11-1946   DATE OF ADMISSION:  06/21/2005  DATE OF DISCHARGE:                                HISTORY & PHYSICAL   REASON FOR ADMISSION:  Right lower extremity claudication.   HISTORY OF PRESENT ILLNESS:  The patient is a 65 year old male with 41-month  history of right lower extremity claudication.  The claudication was  initially confined to the right calf.  Now he has some thigh and buttocks  involvement.  This occurs at approximately 20 yards.  The claudication pain  immediately resolves with a brief rest.  He denies rest pain.   His atherosclerotic risk factors include hypertension, diabetes, high  cholesterol, and coronary artery disease.   He is a former smoker and quit 10 years ago.   PAST MEDICAL HISTORY:  Immune thrombocytopenic purpura.   PAST SURGICAL HISTORY:  1.  Splenectomy for ITP.  2.  Aortic valve replacement, with coronary artery bypass grafting by Dr.      Laneta Simmers in 2004.   SOCIAL HISTORY:  As mentioned above.   MEDICATIONS:  1.  Mavik 8 mg daily.  2.  Hydrochlorothiazide 12.5 mg daily.  3.  Glyburide/metformin 2.5/500, two in the morning, one at night.  4.  Toprol 50 mg daily.  5.  Coumadin 7.5 mg five days a week, and 5 mg two days a week.  6.  Lipitor 20 mg daily.   He has no known drug allergies.   FAMILY HISTORY:  Unremarkable.  Specifically, he denies any history of  aneurysm or early coronary artery disease.   REVIEW OF SYSTEMS:  He denies any history of TIA, stroke, shortness of  breath, or chest pain.   PHYSICAL EXAMINATION:  VITAL SIGNS:  Blood pressure 128/68; pulse 74;  respirations 18.  GENERAL:  White male in no acute distress.  HEENT:  Unremarkable.  NECK:  2+ carotid pulses, without bruits.  CHEST:  Clear to auscultation.  CARDIAC:  Regular rate and rhythm, without murmur.  Valve click present.  Well-healed sternotomy scar.  ABDOMEN:  Soft, nontender, nondistended.  No pulsatile masses.  EXTREMITIES:  He has an absent right femoral and 1+ left femoral pulse.  He  has 1+ pedal pulses on the left side and absent pedal pulses on the right  side.  There are no ulcerations on the feet.  The feet are adequately  perfused bilaterally.  NEUROLOGIC:  Sensation is intact to light touch in the feet bilaterally.   DATA:  ABIs on June 12, 2005 show an ABI on the right of 0.57 and on the left  of greater than 1.  The thigh pressure on the right side is slightly  decreased compared to the left.  There is also suggestion of iliac artery  occlusive disease.   ASSESSMENT:  Right thigh and calf claudication in a patient with previous  aortic  valve replacement on Coumadin.  Risks, benefits, and possible  complications of angioplasty and stenting of the right iliac artery were  explained to the patient.  He understands and agrees to proceed.  We will  admit him on June 21, 2005 for bridging of his Coumadin to heparin and then  tentatively plan arteriogram +/- iliac artery stenting on Tuesday June 23, 2005.           ______________________________  Janetta Hora. Fields, MD     CEF/MEDQ  D:  06/19/2005  T:  06/19/2005  Job:  [id number]

## 2011-04-24 NOTE — Op Note (Signed)
NAME:  [name] [name], BUNTE NO.:  [id number]   MEDICAL RECORD NO.:  [id number]          PATIENT TYPE:  INP   LOCATION:  2018                         FACILITY:  [facility]   PHYSICIAN:  Janetta Hora. Fields, MD  DATE OF BIRTH:  1946-02-15   DATE OF PROCEDURE:  07/10/2005  DATE OF DISCHARGE:  07/03/2005                                 OPERATIVE REPORT   PROCEDURE:  1.  Aortogram with bilateral lower extremity run off.  2.  Angioplasty and stenting of right common iliac artery.   PREOPERATIVE DIAGNOSIS:  Claudication of the right lower extremity.   POSTOPERATIVE DIAGNOSIS:  Claudication of the right lower extremity.   ANESTHESIA:  Local.   ASSISTANT:  Nurse.   OPERATIVE DETAILS:  After obtaining informed consent, the patient was taken  to the PV Lab.  Both groins were prepped and draped in the usual sterile  fashion.  Local anesthesia was infiltrated over the left common femoral  artery.  The left common femoral artery was then cannulated with a Majestic  needle.  A 0.035 J-tip guide-wire was threaded into the abdominal aorta  under fluoroscopic guidance.  A 5 French sheath was then placed in the left  common femoral artery over the guide-wire.  The sheath was thoroughly  flushed with heparinized saline.  Next, a 5 French pigtail catheter was  advanced over the guide-wire into the abdominal aorta.  An abdominal  aortogram was then obtained.  This shows bilateral patent renal arteries.  The distal abdominal aorta has minimal atherosclerotic changes.  The left  common iliac artery is widely patent.  The right common iliac artery has a  high grade greater than 90% stenosis just distal to the origin.  The left  internal iliac artery is patent.  The right internal iliac artery is patent.  The left and right external iliac arteries are patent with mild to moderate  atherosclerotic changes.  Lower extremity run off shows that the superficial  femoral artery and profunda femoris  arteries are patent bilaterally.  The  popliteal arteries are patent bilaterally.  The tibial peroneal trunk,  posterior tibial, and peroneal arteries are patent bilaterally.  The  anterior tibial artery is not visualized.   Next, the patient was given 5000 units of intravenous heparin for systemic  heparinization.  The area of the right common femoral artery was then  anesthetized with local anesthesia.  The right common femoral artery was  then cannulated with a Majestic needle.  A 0.035 Wholey wire was then  advanced into the distal external iliac artery.  A 6 French sheath was  placed over the guide-wire and at the right femoral artery.  Next, a 0.035  angled glide-wire was used to pass the wire up the right common iliac artery  and cross the stenosis.  An H1 catheter was then threaded over the glide-  wire up into the abdominal aorta.  The glide-wire was then exchanged for a  0.035 Wholey wire.  Next, a 7 by 29 balloon expandable stent was placed in  position at the iliac stenosis.  This was then  inflated to 14 atmospheres  for 60 seconds.  There was still some mild stenosis due to the lesion being  heavily calcified.  The balloon was then reinflated for 13 atmospheres for  two minutes.  Completion arteriogram then shows the right common iliac  artery as  widely patent with minimal approximately 10% residual stenosis.  The sheaths  were left in place until the ACT was less than 175.  The patient tolerated  the procedure well and there were no complications.  The patient was taken  to the recovery room in stable condition.       CEF/MEDQ  D:  07/10/2005  T:  07/10/2005  Job:  [id number]

## 2011-04-24 NOTE — Discharge Summary (Signed)
NAME:  [name] [name], [name] [name]                         ACCOUNT NO.:  [id number]   MEDICAL RECORD NO.:  [id number]                   PATIENT TYPE:  INP   LOCATION:  2022                                 FACILITY:  [facility]   PHYSICIAN:  Evelene Croon, M.D.                  DATE OF BIRTH:  06/10/1946   DATE OF ADMISSION:  11/07/2003  DATE OF DISCHARGE:  11/14/2003                                 DISCHARGE SUMMARY   ADMISSION DIAGNOSES:  1. Severe aortic stenosis.  2. Three-vessel coronary artery disease.   DISCHARGE DIAGNOSES:  1. Severe aortic stenosis.  2. Three-vessel coronary artery disease.   PROCEDURES:  1. November 07, 2003, sternotomy, extracorporeal circulation, coronary artery     bypass grafting x 3 using left internal mammary artery to the left     anterior descending coronary artery, sequential saphenous vein graft to     the posterior descending and posterolateral branches of the right     coronary artery.  He also had an aortic valve replacement using a 23 mm     St. Jude mechanical valve.  Endoscopic vein harvesting from the left leg.     These procedures were done by Evelene Croon, M.D.  2. November 07, 2003, intraoperative transesophageal echocardiogram by Guadalupe Maple, M.D.  Results showed successful aortic valve replacement.     There was trace aortic and mitral valve insufficiency.  There was no     evidence of mitral valve prolapse.  There was moderate left ventricular     hypertrophy, good contractility with ejection fraction of 60 to 65%.   PAST MEDICAL HISTORY:  1. Glomerulonephritis.  2. Three-vessel coronary artery disease.  3. Diabetes mellitus, type 2.  4. Hyperlipidemia.  5. Idiopathic thrombocytopenic purpura status post laparoscopic splenectomy     on February 16, 2003, by Vikki Ports, M.D.  6. History of tonsillectomy.  7. Hypertension.   ALLERGIES:  No known drug allergies.   BRIEF HISTORY:  [name] [name] is a 65 year old Caucasian male with  known  history of aortic stenosis and was hospitalized this past March for  idiopathic thrombocytopenic purpura.  During hospitalization, he did have an  echocardiogram showing ejection fraction of 55 to 65%.  In August 2004, he  had another echocardiogram which showed ejection fraction of 75%; however,  there was some question whether or not the aortic valve was bicuspid.  Over  the next several months, he did have some episodes of lightheadedness and  was scheduled for cardiac catheterization on October 23, 2003, in  anticipation for need for aortic valve replacement.  During heart  catheterization, however, he was noted to have three-vessel coronary artery  disease with a 50% mid LAD and 60% distal LAD stenosis.  The left circumflex  had 50% stenosis, and the first marginal had 60% stenosis.  The right  coronary artery had 70% stenosis.  Based on these findings, he was referred  to Dr. Laneta Simmers by Dr. Charlton Haws in anticipation of undergoing mitral valve  replacement as well as coronary artery bypass grafting.  After Dr. Laneta Simmers  discussed the risks and benefits of these procedures, the patient did agree  to proceed, and he was scheduled for surgery on November 07, 2003.   HOSPITAL COURSE:  On November 07, 2003, [name] [name] was electively admitted  to [facility] and did undergo coronary artery bypass grafting x 3  and aortic valve replacement using a 23 mm St. Jude mechanical valve.  These  procedures were done by Evelene Croon, M.D.  No complications were noted  during these procedures, and he was transferred from the operating room to  surgical intensive care unit.  He was extubated later that day  neurologically intact.   On the morning of postoperative day #1, [name] [name] was afebrile with blood  pressure 105/45.  His heart rate was in sinus rhythm at 80 beats per minute.  His heart sounds did show a crisp valve click.  Chest x-ray was stable as  were his postoperative  labs.  His chest tubes were discontinued, and he was  started on Coumadin for mechanical aortic valve.  Later that day, he was  transferred to the telemetry unit.   On postop day #2, while he continued to progress, he was noted to have a  spike in his temperature measuring 102.3.  Blood cultures and a urine  culture were ordered.  Results eventually returned as normal.  He was given  an additional  dose of vancomycin.  Dr. Laneta Simmers did examine the patient and  felt that contaminated IV site may be the culprit of his fever.  The  antecubital IV site was discontinued, and another IV access was obtained   Over the next few days, [name] [name]' temperature did resolve.  His chest x-  rays remained stable, and he was saturating above 90% on room air.  His  heart rate remained in sinus rhythm.  He was tolerating oral intake and  voiding without difficulty.  His sternal incision and right lower extremity  incisions were healing well.  They were clean and dry.  The patient was also  ambulating independently in the hallways.  However, on postoperative day #6,  the patient was still noted to be 10 pounds above his baseline or  preoperative weight, and he was started on Lasix with potassium  supplementation.  During this time, he remained on Coumadin with INR goal  set for 2 to 2.5.  During hospitalization, his blood sugars typically ran  less than 200, although he did have one spike on November 12, 2003, of 311  which he states was taken after eating an oatmeal cookie.  His blood sugars  were managed with glyburide 5 mg in the morning and 2.5 mg in the evening as  well as Metformin 1000 mg in the morning and 500 mg in the evening as well  as Novolog insulin sliding scale for blood sugars greater than 120.   Due to [name] [name]' progression, Dr. Laneta Simmers did anticipate he could be  discharged home on November 14, 2003.  Discharge is pending, but his INR is therapeutic between 2 and 2.5.  He remains  afebrile.  His vital signs and  incision sites remain stable.  His pacing wires were discontinued on  November 10, 2003, and his chest tube sutures will be discontinued prior to  his discharge home.  LABORATORY DATA:  On November 13, 2003, his PT was 16.9, INR 1.6.  On  November 11, 2003, sodium 134, potassium 3.9, blood glucose 113, BUN 6,  creatinine 0.8.  White count 10.1, hemoglobin 10.4, hematocrit 30.5, and  platelet count 224.  His blood cultures from November 09, 2003, remained  without growth.  Urinalysis from November 09, 2003, was completely normal;  therefore, a urine culture was not indicated.   DISCHARGE MEDICATIONS:  He will resume the following home medications.  1. Protonix  40 mg 1 p.o. daily.  2. Multivitamins 1 p.o. daily.  3. Lipitor 10 mg 1 p.o. daily.  4. Glyburide/metformin 2.5 mg/500 mg 2 tablets p.o. q.a.m. and 1 tablet p.o.     q.p.m.  He will continue the following hospital medications.  1. Toprol XL 50 mg 1 p.o. daily.  2. Lasix 40 mg 1 p.o. daily x 7 days.  3. K-Dur 20 mEq 1 p.o. daily x 7 days.  4. Coumadin.  Specific dosing instructions will be determined at time of     discharge.  5. Tylox 1 to 2 tablets p.o. q.4h. p.r.n. pain.  A prescription for 40     tablets was written with no refills.   ACTIVITY:  His is instructed to avoid driving, heavy lifting, or strenuous  activity.   DIET:  He is to follow a balanced, low-fat, low-salt diet.   WOUND CARE:  He may shower daily with soap and water.  He should report  fever, redness, or drainage from incision site to the [facility] office.   FOLLOW UP:  1. Follow up with Dr. Laneta Simmers at the [facility] office on Tuesday, December 11, 2003,     at 11:30 a.m.  He is instructed to bring his chest x-ray films with him     to this appointment.  2. He is to follow up with Dr. Eden Emms at [facility] in two weeks.     He should have a two-view chest x-ray at that time.  This appointment has     been scheduled for Tuesday,  December 21, at 3:15 p.m.  3. He has an appointment at the [name] Coumadin clinic on Friday, November 16, 2003, at 11:45 a.m.  4. He should follow up with his primary physician within four weeks.  He     should call to schedule that appointment.      Jerold Coombe, P.A.                  Evelene Croon, M.D.    AWZ/MEDQ  D:  11/13/2003  T:  11/14/2003  Job:  [id number]   cc:   Charlton Haws, M.D.

## 2011-04-24 NOTE — Consult Note (Signed)
NAME:  [name] [name], [name] [name]                   ACCOUNT NO.:  [id number]   MEDICAL RECORD NO.:  [id number]                   PATIENT TYPE:  INP   LOCATION:  0270                                 FACILITY:  [facility]   PHYSICIAN:  Vikki Ports, M.D.         DATE OF BIRTH:  [birth year]   DATE OF CONSULTATION:  02/06/2003  DATE OF DISCHARGE:                                   CONSULTATION   REASON FOR CONSULTATION:  Idiopathic thrombocytopenic purpura, question  candidate for splenectomy.   HISTORY OF PRESENT ILLNESS:  The patient is a 65 year old white male who has  enjoyed good health his entire life, however, about five or six weeks ago on  a trip to [place], [place] to visit his sister and brother-in-law he began  feeling weak and dizzy and noted some lightheadedness.  He had also noted  significant bruising over the prior two weeks.  When he was seen in the  emergency room there he was noted to have a hemoglobin of 8 and a platelet  count of 2000.  He underwent upper endoscopy which showed prepyloric  erosions and had those cauterized.  He underwent bone marrow biopsy which  showed normal bone marrow.  He received a total of 8 units of packed red  blood cells while down in [place].  He was given Solu-Medrol and platelets  rose to 35,000.  He has been on an intermittent regimen including prednisone  and WinRho over the last two weeks.  Most recently he was on high dose  Decadron beginning on January 30, 2003 at 20 mg p.o. b.i.d.  Platelet count  rose to 38,000 but he returned after stopping the Decadron and noting a  platelet count of 15,000 yesterday in the clinic.  He was admitted by Pierce Crane, M.D. for IVIG therapy.   PAST MEDICAL HISTORY:  1. Significant for type 2 diabetes mellitus; however, on steroids he has     required the addition of insulin to his protocol.  2. Also has a history of hypertension.  3. Hyperlipidemia.   MEDICATIONS:  1. Protonix 40 mg p.o.  daily.  2. Zocor 20 mg p.o. daily.  3. Altace 5 mg p.o. daily.  4. Glyburide 5 mg p.o. daily.  5. Glucophage 500 mg p.o. b.i.d.  6. Sliding scale insulin.  7. Prednisone 10 mg p.o. daily.  8. IVIG daily.   SOCIAL HISTORY:  Significant for a 60-pack-year tobacco history; however, he  quit eight years ago.  He does consume alcohol on a regular basis.  He is  married.  Lives in the [place] in [place].   FAMILY HISTORY:  There is a history of thrombocytopenia in the family.   PHYSICAL EXAMINATION:  GENERAL:  The patient is a pleasant gentleman and age  appropriate.  VITAL SIGNS:  Blood pressure 130/80, heart rate 85, respiratory rate 20,  weight 190 pounds.  HEENT:  Sclerae nonicteric.  NECK:  Supple and soft with no thyromegaly  and no cervical adenopathy.  There are no mucosal lesions.  SKIN:  There is significant ecchymosis on the abdomen and subcutaneous  ecchymosis in the lower extremities.  LUNGS:  Clear to auscultation.  HEART:  Regular rate and rhythm without murmurs, rubs, or gallop.  ABDOMEN:  Soft and nontender with no hepatosplenomegaly noted.   LABORATORY VALUES:  Platelet count of 12,000 this morning.  Ultrasound  performed eight days ago showed an unenlarged spleen.   IMPRESSION:  Idiopathic thrombocytopenic purpura.   PLAN:  Continue IVIG therapy and plan for laparoscopic splenectomy whenever  his counts rise to at least 50,000.  I explained this to the patient unclear  sa to when he is going to respond and for what length of time.  This will be  supported.  It is difficult to schedule him for a laparoscopic splenectomy  at this time but I will discuss this further with Pierce Crane, M.D.  Hopefully we can take care of this this hospitalization.   I discussed the risks/benefits including bleeding, conversion to open,  injury to the colon, the small bowel, the pancreas, and the stomach.  He  understands and wishes to proceed.                                                Vikki Ports, M.D.    KRH/MEDQ  D:  02/06/2003  T:  02/06/2003  Job:  [id number]   cc:   Pierce Crane, M.D.  [address] [facility]  [place]  [place] [address]  Fax: [phone]   Gwen Pounds, M.D.  [address]  [place]  [place] [address]  Fax: [phone]

## 2011-04-24 NOTE — Op Note (Signed)
NAME:  [name] [name], [name] [name]                         ACCOUNT NO.:  [id number]   MEDICAL RECORD NO.:  [id number]                   PATIENT TYPE:  INP   LOCATION:  2304                                 FACILITY:  [facility]   PHYSICIAN:  Guadalupe Maple, M.D.               DATE OF BIRTH:  03/19/1946   DATE OF PROCEDURE:  11/07/2003  DATE OF DISCHARGE:                                 OPERATIVE REPORT   REPORT TITLE:  TRANSESOPHAGEAL ECHOCARDIOGRAPHY REPORT   ANESTHESIOLOGIST:  Guadalupe Maple, M.D.   PROCEDURE:  Intraoperative transesophageal echocardiography.   HISTORY:  The patient is a 64 year old white male with a history of aortic  stenosis and coronary artery disease who was scheduled to undergo coronary  artery bypass grafting and aortic valve replacement by Dr. Laneta Simmers under  general anesthesia.  Intraoperative transesophageal echocardiography was  requested to evaluate the aortic valve to size the aortic valve to determine  if any other valve pathology was present, and to assess left ventricular  function as well as to assess the adequacy of the aortic valve replacement.   DESCRIPTION OF PROCEDURE:  The patient was brought to the operating room of  [facility].  General anesthesia was induced without difficulty.  The trachea was intubated without difficulty.  The transesophageal  echocardiography probe was then inserted into the esophagus without  difficulty.   FINDINGS:  1. Moderate aortic stenosis.  There was a bicuspid aortic valve with     moderate-to-severe calcification of the leaflets.  The aortic valve area     measured 0.98 cm squared by planimetry.  The continuity equation method     resulted in an aortic valve area of 1.1 cm squared.  There was a peak     gradient measured of 89 mmHg with a mean gradient of 34 mmHg.  The aortic     annulus measured 2.2 cm in diameter.  The aortic root measured 3.4 cm.     The proximal aortic sinotubular ridge measured 3.0 cm.  2.  The mitral valve showed no evidence of mitral valve prolapse.  The     leaflets coarcted well without fluttering.  There was trace mitral     insufficiency.  The leaflets were normal appearing.  3. Left ventricle revealed moderate left ventricular hypertrophy with left     ventricular wall thickness of the anterior wall at the mid  __________     level at end-diastole of 2.2 cm.  There was good contractility in all     segments interrogated.  The ejection fraction was estimated at 65%.  4. The tricuspid valve appeared normal.  There was trace tricuspid     insufficiency.  The leaflets were normal appearing.  5. The right ventricular function showed the right ventricular size to be     within normal limits with good contractility of the right ventricular     free wall.  6. The intraatrial septum showed an intraatrial septal aneurysm, but no     evidence of patent foramen ovale or atrial septal defect.  7. The left atrial size appeared to be within normal limits.  There was no     thrombus in the left atrium or the left atrial appendage.  8. The proximal ascending aorta showed no evidence of significant     atheromatous disease.   POST BYPASS FINDINGS:  1. Successful aortic valve replacement using a bileaflet mechanical     prosthesis.  The prosthesis was well seated with trace aortic     insufficiency jets which were typical for a bi-leaflet prosthesis.  There     was no perivalvular insufficiency noted.  Both leaflets opened and closed     normally.  Continuous wave Doppler interrogation across the prosthesis     showed a peak gradient of 30 mmHg, and a mean gradient of 13 mmHg.  2. The mitral valve appeared unchanged from the pre-bypass study.  The     leaflet coarcted well.  There was trace mitral insufficiency.  There was     no evidence of prolapse or fluttering.  3. Left ventricular function appeared unchanged from the pre-bypass study.     There was moderate left ventricular  hypertrophy but good contractility in     all segments interrogated.  The ejection fraction was 60-65%.                                               Guadalupe Maple, M.D.    DCJ/MEDQ  D:  11/07/2003  T:  11/08/2003  Job:  [id number]

## 2011-04-24 NOTE — Discharge Summary (Signed)
NAME:  [name] [name], [name] [name] NO.:  [id number]   MEDICAL RECORD NO.:  [id number]          PATIENT TYPE:  INP   LOCATION:  2018                         FACILITY:  [facility]   PHYSICIAN:  Janetta Hora. Fields, MD  DATE OF BIRTH:  [birth year]   DATE OF ADMISSION:  06/21/2005  DATE OF DISCHARGE:                                 DISCHARGE SUMMARY   PRIMARY DIAGNOSIS:  Right lower extremity claudication with high-grade right  coronary artery stenosis.   SECONDARY DIAGNOSES:  1.  Hypertension.  2.  Diabetes mellitus.  3.  Hyperlipidemia.  4.  Immune thrombocytopenic purpura.  5.  Coronary artery disease, status post coronary artery bypass grafting.  6.  Aortic stenosis, status post aortic valve replacement with [employer]      [employer] mechanical valve.   ALLERGIES:  No known drug allergies.   IN-HOSPITAL OPERATIONS AND PROCEDURES:  1.  Aortogram with runoff.  2.  Right CA stent.   HISTORY AND PHYSICAL AND HOSPITAL COURSE:  Mr. Ciotti is a 65 year old  male with an 96-month history of right lower extremity claudication.  Claudication was initially confined to the right calf, but now he has some  thigh and buttock involvement.  This occurs at approximately 20 yards.  The  claudication pain immediately resolves with a brief rest.  He denies rest  pain.  The patient has a past medical history of hypertension, diabetes  mellitus, hyperlipidemia, coronary artery disease, status post coronary  artery bypass grafting and aortic stenosis, status post aortic valve  replacement as well as immune thrombocytopenic purpura.  The patient was  seen and evaluated by Dr. Darrick Penna.  Dr. Darrick Penna discussed with patient  performing angioplasty and stenting of the right iliac artery.  He discussed  the risks and benefits of the procedure.  The patient acknowledged  understanding and agreed to proceed.  This was scheduled for June 23, 2005.  For details of the patient's past medical history and physical  exam, please  see dictated history and physical.   HOSPITAL COURSE:  Mr. Ishida was admitted to [facility] on June 21, 2005.  On admission, the patient was started on heparin.  His Coumadin was stopped.  The patient was scheduled.  On June 23, 2005, the patient was taken down for  an aortogram with runoff with a right common iliac artery stent placement.  The patient tolerated this procedure well and was readmitted to the floor.  ABIs were done postoperatively and demonstrated increase in the rate from  0.57 to 0.93, and left continued to be greater than 1.0.  The patient was  continued on heparin postoperatively.  On postop day 1, the patient was  without complaints.  He was out of bed, ambulating well.  He had 2+ DP  pulses noted.  The foot was warm and well-perfused.  No hematoma was noted.  The patient was restarted back on his Coumadin.  Again heparin was continued  until Coumadin was therapeutic.  Over the next several days, the patient was  without complaints.  His right foot remained warm and well-perfused.  He  remained to have 2+ dorsalis pedis pulses on the right.  His INR was  therapeutic on the 22nd and 23rd.  The patient was discharged to home when  INR therapeutic.  A follow-up appointment will be scheduled with Dr. Darrick Penna  in two weeks.  The patient will have ABIs at this appointment.  He is to  have his PT and INR checked the day after discharge.  Mr. Pistilli received  instructions on diet, activity level and incisional care.  He was told no  driving until released to do so and no heavy lifting over 10 pounds.  He  will be allowed to shower and wash the incision with soap and water.  He is  to ambulate three to four times per day, progress as tolerated.  The patient  acknowledged understanding.   DISCHARGE MEDICATIONS:  1.  Glyburide/metformin 2.5/500 mg two tablets in a.m., one in p.m.  2.  Mavik 8 mg daily.  3.  HCTZ 12.5 mg daily.  4.  Toprol XL 50 mg daily.  5.   Coumadin 7.5 mg five days a week, 5 mg two days a week.  The patient is      to continue as taken at home.  6.  Tylox one to two tablets p.o. q.4-6h. p.r.n. pain.      Ria Bush   KMD/MEDQ  D:  06/26/2005  T:  06/27/2005  Job:  [id number]

## 2011-04-30 ENCOUNTER — Ambulatory Visit (INDEPENDENT_AMBULATORY_CARE_PROVIDER_SITE_OTHER): Payer: BC Managed Care – PPO | Admitting: Vascular Surgery

## 2011-04-30 ENCOUNTER — Other Ambulatory Visit: Payer: Self-pay | Admitting: Cardiovascular Disease

## 2011-04-30 DIAGNOSIS — I70219 Atherosclerosis of native arteries of extremities with intermittent claudication, unspecified extremity: Secondary | ICD-10-CM

## 2011-05-01 NOTE — Assessment & Plan Note (Signed)
OFFICE VISIT  [name] [name], [name] [name] DOB:  [birth year]                                       04/30/2011 CHART#:[id number]  The patient returns for followup today.  He had an extended left femoral endarterectomy and profundoplasty for claudication symptoms in his left lower extremity.  This was done on 03/04/2011.  He had some difficulty healing up his left groin postoperatively and had a VAC dressing placed. He returns today for further followup.  He states that he feels like his walking distance has improved significantly.  He still has some edema in the left lower extremity but this is resolving.  VAC therapy has been discontinued and he is now doing hydrogel dressings to the left groin. He denies any fever or chills or significant drainage from the left groin.  PHYSICAL EXAM:  Vital signs:  Blood pressure is 148/70 in the left arm, heart rate 74 and regular.  Temperature is 97.8.  Left groin:  The inferior aspect of the incision approximately 2 x 2 cm is still open with good granulation tissue.  There is no significant drainage at this point.  There is no surrounding erythema.  He does have some edema in the left lower extremity compared to the right.  The left leg is approximately 15% larger than the left.  Foot is pink, warm and well- perfused.  At this point I believe the patient's wound should heal completely with hydrogel dressings for a few more weeks.  He will follow up with [place] in a few months for a duplex evaluation of his lower extremities.  Hopefully, by that time he will have had enough time to ambulate to see if he has had significant improvement in his symptoms.  Of note, he does have a right common iliac calcified stenosis that we could consider treating at some point if his right leg becomes symptomatic.    Janetta Hora. Corliss Coggeshall, MD Electronically Signed  CEF/MEDQ  D:  04/30/2011  T:  05/01/2011  Job:  [id number]

## 2011-05-06 ENCOUNTER — Encounter: Payer: BC Managed Care – PPO | Admitting: *Deleted

## 2011-05-19 ENCOUNTER — Other Ambulatory Visit: Payer: Self-pay | Admitting: Cardiovascular Disease

## 2011-07-21 ENCOUNTER — Encounter: Payer: Self-pay | Admitting: Vascular Surgery

## 2011-07-22 ENCOUNTER — Other Ambulatory Visit: Payer: Self-pay | Admitting: Cardiovascular Disease

## 2011-07-23 ENCOUNTER — Ambulatory Visit (INDEPENDENT_AMBULATORY_CARE_PROVIDER_SITE_OTHER): Payer: BC Managed Care – PPO | Admitting: *Deleted

## 2011-07-23 DIAGNOSIS — I359 Nonrheumatic aortic valve disorder, unspecified: Secondary | ICD-10-CM

## 2011-07-23 DIAGNOSIS — G459 Transient cerebral ischemic attack, unspecified: Secondary | ICD-10-CM

## 2011-08-05 ENCOUNTER — Encounter: Payer: Self-pay | Admitting: Vascular Surgery

## 2011-08-06 ENCOUNTER — Ambulatory Visit (INDEPENDENT_AMBULATORY_CARE_PROVIDER_SITE_OTHER): Payer: BC Managed Care – PPO | Admitting: Vascular Surgery

## 2011-08-06 ENCOUNTER — Other Ambulatory Visit (INDEPENDENT_AMBULATORY_CARE_PROVIDER_SITE_OTHER): Payer: BC Managed Care – PPO | Admitting: *Deleted

## 2011-08-06 ENCOUNTER — Ambulatory Visit (INDEPENDENT_AMBULATORY_CARE_PROVIDER_SITE_OTHER): Payer: BC Managed Care – PPO | Admitting: *Deleted

## 2011-08-06 DIAGNOSIS — I6529 Occlusion and stenosis of unspecified carotid artery: Secondary | ICD-10-CM

## 2011-08-06 DIAGNOSIS — I739 Peripheral vascular disease, unspecified: Secondary | ICD-10-CM

## 2011-08-06 DIAGNOSIS — G459 Transient cerebral ischemic attack, unspecified: Secondary | ICD-10-CM

## 2011-08-06 DIAGNOSIS — I70219 Atherosclerosis of native arteries of extremities with intermittent claudication, unspecified extremity: Secondary | ICD-10-CM

## 2011-08-06 DIAGNOSIS — Z48812 Encounter for surgical aftercare following surgery on the circulatory system: Secondary | ICD-10-CM

## 2011-08-06 NOTE — Progress Notes (Signed)
Patient is a 65 year old male who has previously undergone left femoral endarterectomy as well as previous iliac stenting. He continues to extremity claudication symptoms in his left lower extremity at fairly short distance. He begins to have claudication at short distances when playing golf or when walking. He recently retired. The left groin wound is now completely healed.  Also, he describes an episode of numbness on the left side of his face which occurred 3 days ago. He was seen by Dr. Timothy Lasso earlier today and it was suspected he may have had a TIA. He has had a TIA in the past which was attributed to thrombus on his prosthetic valve. He sees Dr. Eden Emms for cardiology issues. He denies any episodes where he had weakness in his arms or legs.   Chronic medical problems remain diabetes, hypertension, elevated cholesterol in ITP. These are all currently controlled and followed by Dr. Timothy Lasso. They have been titrating his blood pressure medication recently.  The patient is on Coumadin for prosthetic valve.  Review of systems: He denies any shortness of breath or chest pain  Physical exam:  Neuro: Upper extremity and lower extremity motor strength is 5 over 5 and symmetric  Neck: No carotid bruit  Cardiac: Regular rate and rhythm with valve click  Lower extremities: 2+ femoral pulses, absent popliteal and pedal pulses, healed left groin incision  Noninvasive vascular studies:  Carotid duplex scan was reviewed and interpreted, less than 40% carotid stenosis bilaterally  ABIs showed noncompressible vessels, he had monophasic waveforms in the left foot with biphasic waveforms in the right foot  Assessment/Plan  #1 Claudication left lower extremity still fairly lifestyle limiting. Since his left groin wound was difficult to heal I would not consider any other operations presently but I believe he could possibly benefit from angioplasty and stenting of his left lower extremity especially if the  SFA is stentable. He will call if he wishes to schedule this in the near future as we will have to schedule this around his Coumadin.  #2 TIA the patient had no evidence of significant carotid occlusive disease today. He is going to schedule a visit with Dr. Eden Emms in the near future for evaluation for possible cardiac source embolus.

## 2011-08-07 NOTE — Procedures (Unsigned)
LOWER EXTREMITY ARTERIAL DUPLEX  INDICATION:  Followup stent placement.  HISTORY: Diabetes:  Yes. Cardiac:  Unknown. Hypertension:  Yes. Smoking:  Previously. Previous Surgery:  Bilateral iliac artery stents.  Extended left femoral endarterectomy and profundoplasty 03/04/2011.  SINGLE LEVEL ARTERIAL EXAM                         RIGHT                LEFT Brachial:               198                  200 Anterior tibial:        Noncompressible      Noncompressible Posterior tibial:       Noncompressible      Noncompressible Peroneal: Ankle/Brachial Index:   Noncompressible      Noncompressible  LOWER EXTREMITY ARTERIAL DUPLEX EXAM  DUPLEX:  Patent right lower extremity arterial system with an elevated velocity of 146 cm/s noted in the mid thigh region. Patent left endarterectomy site.  IMPRESSION: 1. Ankle brachial indices are unable to be obtained due to     noncompressible vessels. 2. Patent bilateral lower extremity arterial systems with velocity     measurements noted on the following worksheet.         ___________________________________________ Janetta Hora. Fields, MD  EM/MEDQ  D:  08/07/2011  T:  08/07/2011  Job:  [id number]

## 2011-08-11 ENCOUNTER — Encounter: Payer: Self-pay | Admitting: Cardiovascular Disease

## 2011-08-14 ENCOUNTER — Encounter: Payer: Self-pay | Admitting: Cardiovascular Disease

## 2011-08-14 ENCOUNTER — Ambulatory Visit (INDEPENDENT_AMBULATORY_CARE_PROVIDER_SITE_OTHER): Payer: BC Managed Care – PPO | Admitting: Cardiovascular Disease

## 2011-08-14 DIAGNOSIS — I739 Peripheral vascular disease, unspecified: Secondary | ICD-10-CM

## 2011-08-14 DIAGNOSIS — I2510 Atherosclerotic heart disease of native coronary artery without angina pectoris: Secondary | ICD-10-CM

## 2011-08-14 DIAGNOSIS — I359 Nonrheumatic aortic valve disorder, unspecified: Secondary | ICD-10-CM

## 2011-08-14 DIAGNOSIS — E7800 Pure hypercholesterolemia, unspecified: Secondary | ICD-10-CM

## 2011-08-14 NOTE — Patient Instructions (Signed)
Your physician recommends that you schedule a follow-up appointment in: 3 months with dr cooper follow up PVD  Your physician wants you to follow-up in: 6 months with dr Haywood Filler will receive a reminder letter in the mail two months in advance. If you don't receive a letter, please call our office to schedule the follow-up appointment.

## 2011-08-14 NOTE — Assessment & Plan Note (Signed)
Stable with no angina and good activity level.  Continue medical Rx  

## 2011-08-14 NOTE — Progress Notes (Signed)
[name] [name] is seen today for F/U. He is S/P AVR on chronic anticoagulation. He had a possible TIA last November when his INR was subRx. His does has been increased and running better with no recurrent TIA;s/ I told him to take a baby ASA with his coumadin. AVR normal by TTE and he did not want to have a TEE. His lipids and BP have been under good control He continues to have 2 martini's a night. He denies SOB, palpitations, SSCP or edema. He has had a distant left iliac stent. Normally followed by Dr Darrick Penna. Wants to switch care to [place]. No current claudication. Incidicates ABI's last month or two with Dr Darrick Penna Will get records and refer to Dr Excell Seltzer. HAD CABG with AVR in 2004 No angina. ON baby asa and BB.   Had a horrible hospitalization in April with multiple surgeries for lymphocele in right groin Needed wound vac.  Things he has persistant paresthesias and still with leg pain. Had dopplers 8/12 reviewed with noncmopressable vessels and patent endarderectomy site no velocities over 5m/sec  Also just lost his job and is a bit stressed by this.  BP meds adjusted by primary   ROS: Denies fever, malais, weight loss, blurry vision, decreased visual acuity, cough, sputum, SOB, hemoptysis, pleuritic pain, palpitaitons, heartburn, abdominal pain, melena, lower extremity edema, claudication, or rash.  All other systems reviewed and negative  General: Affect appropriate Healthy:  appears stated age HEENT: normal Neck supple with no adenopathy JVP normal no bruits no thyromegaly Lungs clear with no wheezing and good diaphragmatic motion Heart:  S1/S2click SEM no ,rub, gallop or click PMI normal Abdomen: benighn, BS positve, no tenderness, no AAA Bilateral femoral bruit with scar left groin  No HSM or HJR Distal pulses intact with no bruits No edema Neuro non-focal Skin warm and dry No muscular weakness   Current Outpatient Prescriptions  Medication Sig Dispense Refill  . amLODipine (NORVASC) 5 MG  tablet Take 5 mg by mouth daily.        [name] amoxicillin (AMOXIL) 500 MG capsule as directed.      [name] atorvastatin (LIPITOR) 40 MG tablet Take 40 mg by mouth daily.        [name] glyBURIDE-metformin (GLUCOVANCE) 2.5-500 MG per tablet Take 2 tablets by mouth 2 (two) times daily at 10 AM and 5 PM.       . hydrochlorothiazide (,MICROZIDE/HYDRODIURIL,) 12.5 MG capsule TAKE 1 CAPSULE EVERY DAY  30 capsule  10  . metoprolol (TOPROL-XL) 50 MG 24 hr tablet TAKE 1 TABLET BY MOUTH EVERY DAY  30 tablet  11  . trandolapril (MAVIK) 4 MG tablet Take 1 tablet (4 mg total) by mouth daily.  30 tablet  6  . warfarin (COUMADIN) 7.5 MG tablet TAKE AS DIRECTED  30 tablet  3    Allergies  Review of patient's allergies indicates no known allergies.  Electrocardiogram:  Assessment and Plan

## 2011-08-14 NOTE — Assessment & Plan Note (Signed)
Cholesterol is at goal.  Continue current dose of statin and diet Rx.  No myalgias or side effects.  F/U  LFT's in 6 months. No results found for this basename: LDLCALC             

## 2011-08-14 NOTE — Assessment & Plan Note (Signed)
Need to get complete records from Dr Darrick Penna.  Will have him see Dr Excell Seltzer in 3 months.  Depending on arterial duplex results may benefit from Pletal  Would not rush into any more interventions given his recent complications

## 2011-08-14 NOTE — Assessment & Plan Note (Signed)
S/P AVR with normal exam.  F/U coumadin clinic.  No further TIA;s and INR;s running more Rx

## 2011-08-20 ENCOUNTER — Encounter: Payer: BC Managed Care – PPO | Admitting: *Deleted

## 2011-08-26 ENCOUNTER — Other Ambulatory Visit: Payer: Self-pay | Admitting: Cardiovascular Disease

## 2011-09-03 ENCOUNTER — Ambulatory Visit (INDEPENDENT_AMBULATORY_CARE_PROVIDER_SITE_OTHER): Payer: BC Managed Care – PPO | Admitting: *Deleted

## 2011-09-03 DIAGNOSIS — G459 Transient cerebral ischemic attack, unspecified: Secondary | ICD-10-CM

## 2011-09-03 DIAGNOSIS — I359 Nonrheumatic aortic valve disorder, unspecified: Secondary | ICD-10-CM

## 2011-09-04 ENCOUNTER — Other Ambulatory Visit: Payer: Self-pay | Admitting: Pharmacist

## 2011-09-21 NOTE — Procedures (Unsigned)
CAROTID DUPLEX EXAM  INDICATION:  Carotid artery stenosis and TIA involving numbness of the left side of the face 3 days ago.  HISTORY: Diabetes:  Yes. Cardiac:  Previous CABG; aortic valve replacement. Hypertension:  Yes. Smoking:  No. Previous Surgery:  Yes. CV History:  Yes. Amaurosis Fugax No, Paresthesias Yes, Hemiparesis No.                                      RIGHT             LEFT Brachial systolic pressure:         198               200 Brachial Doppler waveforms:         Triphasic         Triphasic Vertebral direction of flow:        Antegrade         Antegrade DUPLEX VELOCITIES (cm/sec) CCA peak systolic                   86                83 ECA peak systolic                   126               150 ICA peak systolic                   40                51 ICA end diastolic                   11                13 PLAQUE MORPHOLOGY:                  Mixed             Mixed PLAQUE AMOUNT:                      Moderate          Moderate PLAQUE LOCATION:                    CCA, bifurcation  CCA, bifurcation  IMPRESSION:  1% to 39% bilateral internal carotid artery stenosis, although calcified shadowing plaque may obscure higher velocities in the proximal right internal carotid artery.  ___________________________________________ Janetta Hora Fields, MD  CI/MEDQ  D:  08/12/2011  T:  08/12/2011  Job:  [id number]

## 2011-09-24 LAB — CBC
HCT: 43.2
HCT: 45.3
HCT: 45.7
Hemoglobin: 14.8
Hemoglobin: 15.3
MCHC: 33.8
MCV: 96.1
MCV: 97.4
RBC: 4.5
RBC: 4.68
RBC: 4.69
WBC: 8
WBC: 9.7

## 2011-09-24 LAB — BASIC METABOLIC PANEL
CO2: 31
Calcium: 9.1
Chloride: 103
GFR calc Af Amer: >60
GFR calc Af Amer: >60
Glucose, Bld: 102 — ABNORMAL HIGH
Potassium: 4.1
Potassium: 4.3
Sodium: 138
Sodium: 141

## 2011-09-24 LAB — PROTIME-INR
INR: 1.1
INR: 2.6 — ABNORMAL HIGH
Prothrombin Time: 13

## 2011-09-24 LAB — URINALYSIS, ROUTINE W REFLEX MICROSCOPIC
Bilirubin Urine: NEGATIVE
Glucose, UA: NEGATIVE
Ketones, ur: NEGATIVE
Nitrite: NEGATIVE
Protein, ur: NEGATIVE
pH: 7

## 2011-09-24 LAB — APTT: aPTT: 41 — ABNORMAL HIGH

## 2011-10-01 ENCOUNTER — Ambulatory Visit (INDEPENDENT_AMBULATORY_CARE_PROVIDER_SITE_OTHER): Payer: BC Managed Care – PPO | Admitting: *Deleted

## 2011-10-01 DIAGNOSIS — I359 Nonrheumatic aortic valve disorder, unspecified: Secondary | ICD-10-CM

## 2011-10-01 DIAGNOSIS — G459 Transient cerebral ischemic attack, unspecified: Secondary | ICD-10-CM

## 2011-10-01 LAB — POCT INR: INR: 2.6

## 2011-11-09 ENCOUNTER — Encounter: Payer: Self-pay | Admitting: Cardiovascular Disease

## 2011-11-09 ENCOUNTER — Ambulatory Visit (INDEPENDENT_AMBULATORY_CARE_PROVIDER_SITE_OTHER): Payer: BC Managed Care – PPO | Admitting: Cardiovascular Disease

## 2011-11-09 ENCOUNTER — Ambulatory Visit (INDEPENDENT_AMBULATORY_CARE_PROVIDER_SITE_OTHER): Payer: BC Managed Care – PPO | Admitting: *Deleted

## 2011-11-09 VITALS — BP 134/70 | HR 74 | Ht 68.0 in | Wt 177.0 lb

## 2011-11-09 DIAGNOSIS — I359 Nonrheumatic aortic valve disorder, unspecified: Secondary | ICD-10-CM

## 2011-11-09 DIAGNOSIS — G459 Transient cerebral ischemic attack, unspecified: Secondary | ICD-10-CM

## 2011-11-09 DIAGNOSIS — I739 Peripheral vascular disease, unspecified: Secondary | ICD-10-CM

## 2011-11-09 DIAGNOSIS — I2510 Atherosclerotic heart disease of native coronary artery without angina pectoris: Secondary | ICD-10-CM

## 2011-11-09 DIAGNOSIS — I70219 Atherosclerosis of native arteries of extremities with intermittent claudication, unspecified extremity: Secondary | ICD-10-CM

## 2011-11-09 LAB — POCT INR: INR: 2.7

## 2011-11-09 NOTE — Progress Notes (Signed)
Thanks [name] [name],  He had a bad experience with Fields and really doesn't want to see him again if surgical intervention is ultimately required

## 2011-11-09 NOTE — Assessment & Plan Note (Addendum)
The patient has severe lifestyle limiting left leg claudication following prior iliac stenting and more recent left femoral endarterectomy. His femoral pulse is palpable on the left and I suspect he has developed infra-inguinal stenosis in the SFA. Recommend a lower extremity duplex scan with ABIs to try to assess the level of disease. He likely will require repeat angiography with a night toward PTA and stenting. He is very reluctant to consider any further surgical procedures as he had a bad experience last time. We'll try to treat him with an endovascular approach if at all possible. I'll schedule him back for followup after his duplex scan is completed.  ADDENDUM: Pt with abnormal ABI's and doppler recommend schedule lower extremity angio and possible PTA/stent. He should be bridged with lovenox as he has mech AoV and hx of TIA. Recommend hold coumadin 5 days prior with lovenox bridge per coumadin clinic.

## 2011-11-09 NOTE — Patient Instructions (Signed)
Your physician recommends that you schedule a follow-up appointment in: 2-4 WEEKS  Your physician recommends that you continue on your current medications as directed. Please refer to the Current Medication list given to you today.  Your physician has requested that you have an ankle brachial index (ABI). During this test an ultrasound and blood pressure cuff are used to evaluate the arteries that supply the arms and legs with blood. Allow thirty minutes for this exam. There are no restrictions or special instructions.  Your physician has requested that you have a lower extremity arterial duplex. This test is an ultrasound of the arteries in the legs. It looks at arterial blood flow in the legs. Allow one hour for Lower Arterial scans. There are no restrictions or special instructions

## 2011-11-09 NOTE — Progress Notes (Signed)
HPI:  Mr. Chimento presents for followup evaluation. The patient is a 65 year old gentleman with lower extremity peripheral arterial disease. He also has a history of aortic stenosis and is status post mechanical aVR with St. Jude valve. He is maintained on chronic warfarin.  The patient has a history of iliac stenting. He developed severe lifestyle limiting claudication and ultimately required left femoral endarterectomy. His surgery was complicated by a postoperative wound infection.  Unfortunately, he has developed progressive left leg pain with ambulation. He describes pain in the thigh and the calf areas. He has minimal pain on the right side but states the left is much worse. He denies rest pain or ischemic ulceration. His symptoms have progressed to the point where he can no longer play golf even with a cart. He describes leg pain within 10-20 feet of walking.  Outpatient Encounter Prescriptions as of 11/09/2011  Medication Sig Dispense Refill  . amLODipine (NORVASC) 5 MG tablet Take 10 mg by mouth daily.       [name] amoxicillin (AMOXIL) 500 MG capsule as directed.      [name] atorvastatin (LIPITOR) 40 MG tablet Take 40 mg by mouth daily.        [name] glyBURIDE-metformin (GLUCOVANCE) 2.5-500 MG per tablet Take 2 tablets by mouth 2 (two) times daily at 10 AM and 5 PM.       . hydrochlorothiazide (,MICROZIDE/HYDRODIURIL,) 12.5 MG capsule TAKE 1 CAPSULE EVERY DAY  30 capsule  10  . metoprolol (TOPROL-XL) 50 MG 24 hr tablet TAKE 1 TABLET BY MOUTH EVERY DAY  30 tablet  11  . trandolapril (MAVIK) 4 MG tablet Take 1 tablet (4 mg total) by mouth daily.  30 tablet  6  . warfarin (COUMADIN) 7.5 MG tablet TAKE AS DIRECTED  30 tablet  2    No Known Allergies  Past Medical History  Diagnosis Date  . Diabetes mellitus   . Hypertension   . Hyperlipidemia   . ITP (idiopathic thrombocytopenic purpura)   . Coronary artery disease   . Aortic stenosis   . Claudication   . Hypercholesteremia   . Diverticulitis of  colon   . Hx of colonic polyps     ROS: Negative except as per HPI  BP 134/70  Pulse 74  Ht 5\' 8"  (1.727 m)  Wt 80.287 kg (177 lb)  BMI 26.91 kg/m2  PHYSICAL EXAM: Pt is alert and oriented, NAD HEENT: normal Neck: JVP - normal, carotids 2+= without bruits Lungs: CTA bilaterally CV: RRR without murmur or gallop Abd: soft, NT, Positive BS, no hepatomegaly Ext: femoral pulses are 2+ with bilateral bruits. The surgical site on the left appears well-healed.  Pedal pulses are absent bilaterally Skin: warm/dry no rash  EKG:  Normal sinus rhythm heart rate 71 beats per minute, nonspecific T-wave abnormality.  ASSESSMENT AND PLAN:

## 2011-11-12 ENCOUNTER — Telehealth: Payer: Self-pay | Admitting: *Deleted

## 2011-11-12 NOTE — Telephone Encounter (Signed)
After several attempts to speak with patient regarding scheduling an aortogram from office visit on 08/06/11; I spoke with [name] [name] today. He said he did not want to schedule it now. He said he would call us back if he decided to go ahead with the procedure.

## 2011-11-13 ENCOUNTER — Encounter (INDEPENDENT_AMBULATORY_CARE_PROVIDER_SITE_OTHER): Payer: BC Managed Care – PPO | Admitting: Cardiology

## 2011-11-13 DIAGNOSIS — I739 Peripheral vascular disease, unspecified: Secondary | ICD-10-CM

## 2011-11-13 DIAGNOSIS — I70219 Atherosclerosis of native arteries of extremities with intermittent claudication, unspecified extremity: Secondary | ICD-10-CM

## 2011-11-25 DIAGNOSIS — H9193 Unspecified hearing loss, bilateral: Secondary | ICD-10-CM | POA: Insufficient documentation

## 2011-11-27 ENCOUNTER — Ambulatory Visit: Payer: BC Managed Care – PPO | Admitting: Cardiovascular Disease

## 2011-11-27 NOTE — Progress Notes (Signed)
Patient ID: [name] [name], male   DOB: March 13, 1946, 65 y.o.   MRN: [id number] I reviewed the patient's lower extremity duplex findings with him on the telephone. We also had a long discussion at the time of his last office visit that I anticipated the need for repeat angiography.  We reviewed risks and indication for angiography and possible PTA and stenting. He understands this well as he has been through both interventional procedures and vascular surgery. He also has some pain in the right leg and requests that there is much be done as possible at the time of his angiogram. He will need to be bridged with Lovenox for the procedure. I will ask the Coumadin clinic to help with management of this. The patient requests to schedule the procedure in January.

## 2011-11-30 ENCOUNTER — Telehealth: Payer: Self-pay | Admitting: Pharmacist

## 2011-11-30 NOTE — Telephone Encounter (Signed)
Message copied by Velda Shell on Mon Nov 30, 2011 11:14 AM ------      Message from: Iona Coach      Created: Fri Nov 27, 2011 12:09 PM      Regarding: need LOVENOX bridge       This pt is scheduled for a PV procedure on 12/16/11 he will need lovenox bridging for procedure.              Thank you

## 2011-11-30 NOTE — Telephone Encounter (Signed)
Pt scheduled for lower extremity angio with Dr. Excell Seltzer on 1/9.  Will need lovenox bridge.  Pt's weight- 80kg, SCr- 0.86.   1/4- Last dose of Coumadin 1/5- No Coumadin or Lovenox 1/6- Start Lovenox 80mg  in AM and PM 1/7- Lovenox 80mg  in AM and PM 1/8- Lovenox 80mg  in AM only 1/9- Day of Procedure.  Follow Dr. Earmon Phoenix directions for when to restart anticoagulation.  When okay, take Coumadin 10mg  x 2 days then resume 7.5mg  daily.   Restart Lovenox 80mg  in AM and PM 1/14- Recheck INR

## 2011-12-04 ENCOUNTER — Telehealth: Payer: Self-pay | Admitting: Cardiovascular Disease

## 2011-12-04 ENCOUNTER — Encounter (HOSPITAL_COMMUNITY): Payer: Self-pay | Admitting: Pharmacy Technician

## 2011-12-04 MED ORDER — ENOXAPARIN SODIUM 80 MG/0.8ML ~~LOC~~ SOLN: 80.0000 mg | Freq: Two times a day (BID) | SUBCUTANEOUS | Status: DC

## 2011-12-04 NOTE — Telephone Encounter (Signed)
Pt aware of PV procedure instructions.  Copy of instructions mailed to the pt's home.

## 2011-12-04 NOTE — Telephone Encounter (Signed)
Returned call to pt and gave verbal instructions as documented by Weston Brass, PharmD for Lovenox bridge. Rx sent to pharmacy, pt aware.

## 2011-12-04 NOTE — Telephone Encounter (Signed)
Pt calling re procedures to have done prior to surgery

## 2011-12-10 ENCOUNTER — Other Ambulatory Visit: Payer: Self-pay | Admitting: *Deleted

## 2011-12-10 DIAGNOSIS — H903 Sensorineural hearing loss, bilateral: Secondary | ICD-10-CM | POA: Diagnosis not present

## 2011-12-10 DIAGNOSIS — R42 Dizziness and giddiness: Secondary | ICD-10-CM | POA: Diagnosis not present

## 2011-12-10 MED ORDER — WARFARIN SODIUM 7.5 MG PO TABS: 7.5000 mg | ORAL_TABLET | Freq: Every day | ORAL | Status: DC

## 2011-12-15 ENCOUNTER — Other Ambulatory Visit: Payer: Self-pay | Admitting: Cardiovascular Disease

## 2011-12-16 ENCOUNTER — Encounter (HOSPITAL_COMMUNITY): Payer: Self-pay | Admitting: Nurse Practitioner

## 2011-12-16 ENCOUNTER — Encounter (HOSPITAL_COMMUNITY)
Admission: RE | Disposition: A | Discharge status: Home / Self Care | Payer: Self-pay | Source: Ambulatory Visit | Attending: Cardiovascular Disease

## 2011-12-16 ENCOUNTER — Ambulatory Visit (HOSPITAL_COMMUNITY)
Admission: RE | Admit: 2011-12-16 | Discharge: 2011-12-17 | Disposition: A | Discharge status: Home / Self Care | Payer: Medicare Other | Source: Ambulatory Visit | Attending: Cardiovascular Disease | Admitting: Cardiovascular Disease

## 2011-12-16 DIAGNOSIS — K5732 Diverticulitis of large intestine without perforation or abscess without bleeding: Secondary | ICD-10-CM | POA: Insufficient documentation

## 2011-12-16 DIAGNOSIS — Z954 Presence of other heart-valve replacement: Secondary | ICD-10-CM | POA: Diagnosis not present

## 2011-12-16 DIAGNOSIS — Z951 Presence of aortocoronary bypass graft: Secondary | ICD-10-CM | POA: Diagnosis not present

## 2011-12-16 DIAGNOSIS — E119 Type 2 diabetes mellitus without complications: Secondary | ICD-10-CM | POA: Diagnosis not present

## 2011-12-16 DIAGNOSIS — I10 Essential (primary) hypertension: Secondary | ICD-10-CM | POA: Diagnosis not present

## 2011-12-16 DIAGNOSIS — I2510 Atherosclerotic heart disease of native coronary artery without angina pectoris: Secondary | ICD-10-CM | POA: Insufficient documentation

## 2011-12-16 DIAGNOSIS — I70219 Atherosclerosis of native arteries of extremities with intermittent claudication, unspecified extremity: Secondary | ICD-10-CM | POA: Insufficient documentation

## 2011-12-16 DIAGNOSIS — I708 Atherosclerosis of other arteries: Secondary | ICD-10-CM | POA: Insufficient documentation

## 2011-12-16 DIAGNOSIS — I359 Nonrheumatic aortic valve disorder, unspecified: Secondary | ICD-10-CM | POA: Insufficient documentation

## 2011-12-16 DIAGNOSIS — I350 Nonrheumatic aortic (valve) stenosis: Secondary | ICD-10-CM | POA: Insufficient documentation

## 2011-12-16 DIAGNOSIS — D693 Immune thrombocytopenic purpura: Secondary | ICD-10-CM | POA: Diagnosis not present

## 2011-12-16 DIAGNOSIS — I739 Peripheral vascular disease, unspecified: Secondary | ICD-10-CM | POA: Insufficient documentation

## 2011-12-16 DIAGNOSIS — E785 Hyperlipidemia, unspecified: Secondary | ICD-10-CM | POA: Diagnosis not present

## 2011-12-16 DIAGNOSIS — E7800 Pure hypercholesterolemia, unspecified: Secondary | ICD-10-CM | POA: Insufficient documentation

## 2011-12-16 DIAGNOSIS — I70209 Unspecified atherosclerosis of native arteries of extremities, unspecified extremity: Secondary | ICD-10-CM | POA: Diagnosis not present

## 2011-12-16 HISTORY — DX: Peripheral vascular disease, unspecified: I73.9

## 2011-12-16 HISTORY — DX: Reserved for inherently not codable concepts without codable children: IMO0001

## 2011-12-16 HISTORY — PX: ABDOMINAL AORTAGRAM: SHX5454

## 2011-12-16 HISTORY — DX: Anemia, unspecified: D64.9

## 2011-12-16 HISTORY — DX: Encounter for other specified aftercare: Z51.89

## 2011-12-16 LAB — BASIC METABOLIC PANEL
BUN: 13 mg/dL (ref 6–23)
Chloride: 103 mEq/L (ref 96–112)
GFR calc non Af Amer: >90 mL/min (ref >90)
Glucose, Bld: 192 mg/dL — ABNORMAL HIGH (ref 70–99)
Sodium: 141 mEq/L (ref 135–145)

## 2011-12-16 LAB — CBC
Hemoglobin: 14.7 g/dL (ref 13.0–17.0)
MCH: 32.5 pg (ref 26.0–34.0)
MCHC: 35.1 g/dL (ref 30.0–36.0)
MCV: 92.5 fL (ref 78.0–100.0)

## 2011-12-16 LAB — POCT ACTIVATED CLOTTING TIME
Activated Clotting Time: 204 seconds
Activated Clotting Time: 226 seconds

## 2011-12-16 LAB — PROTIME-INR: Prothrombin Time: 15 seconds (ref 11.6–15.2)

## 2011-12-16 LAB — GLUCOSE, CAPILLARY: Glucose-Capillary: 144 mg/dL — ABNORMAL HIGH (ref 70–99)

## 2011-12-16 SURGERY — ABDOMINAL AORTAGRAM
Anesthesia: LOCAL

## 2011-12-16 MED ORDER — ACETAMINOPHEN 325 MG PO TABS: 650.0000 mg | ORAL_TABLET | ORAL | Status: DC | PRN

## 2011-12-16 MED ORDER — LABETALOL HCL 5 MG/ML IV SOLN
20.0000 mg | Freq: Once | INTRAVENOUS | Status: AC | PRN
  Administered 2011-12-16: 20 mg via INTRAVENOUS
  Filled 2011-12-16 (×2): qty 4

## 2011-12-16 MED ORDER — SODIUM CHLORIDE 0.9 % IV SOLN: 1.0000 mL/kg/h | INTRAVENOUS | Status: AC

## 2011-12-16 MED ORDER — LIDOCAINE HCL (PF) 1 % IJ SOLN
INTRAMUSCULAR | Status: AC
  Filled 2011-12-16: qty 30

## 2011-12-16 MED ORDER — WARFARIN SODIUM 10 MG PO TABS
10.0000 mg | ORAL_TABLET | Freq: Once | ORAL | Status: AC
  Administered 2011-12-16: 10 mg via ORAL
  Filled 2011-12-16: qty 1

## 2011-12-16 MED ORDER — ONDANSETRON HCL 4 MG/2ML IJ SOLN: 4.0000 mg | Freq: Four times a day (QID) | INTRAMUSCULAR | Status: DC | PRN

## 2011-12-16 MED ORDER — CLOPIDOGREL BISULFATE 300 MG PO TABS
ORAL_TABLET | ORAL | Status: AC
  Filled 2011-12-16: qty 2

## 2011-12-16 MED ORDER — WARFARIN SODIUM 7.5 MG PO TABS: 7.5000 mg | ORAL_TABLET | Freq: Every day | ORAL | Status: DC

## 2011-12-16 MED ORDER — OXYCODONE-ACETAMINOPHEN 5-325 MG PO TABS: 1.0000 | ORAL_TABLET | ORAL | Status: DC | PRN

## 2011-12-16 MED ORDER — DIAZEPAM 5 MG PO TABS: 5.0000 mg | ORAL_TABLET | ORAL | Status: DC | PRN

## 2011-12-16 MED ORDER — DIAZEPAM 5 MG PO TABS
5.0000 mg | ORAL_TABLET | ORAL | Status: AC
  Administered 2011-12-16: 5 mg via ORAL

## 2011-12-16 MED ORDER — MORPHINE SULFATE 2 MG/ML IJ SOLN
2.0000 mg | INTRAMUSCULAR | Status: DC | PRN
  Administered 2011-12-16: 2 mg via INTRAVENOUS
  Filled 2011-12-16: qty 1

## 2011-12-16 MED ORDER — CLOPIDOGREL BISULFATE 75 MG PO TABS
75.0000 mg | ORAL_TABLET | Freq: Every day | ORAL | Status: DC
  Administered 2011-12-17: 75 mg via ORAL
  Filled 2011-12-16 (×2): qty 1

## 2011-12-16 MED ORDER — MIDAZOLAM HCL 2 MG/2ML IJ SOLN
INTRAMUSCULAR | Status: AC
  Filled 2011-12-16: qty 2

## 2011-12-16 MED ORDER — HEPARIN (PORCINE) IN NACL 2-0.9 UNIT/ML-% IJ SOLN
INTRAMUSCULAR | Status: AC
  Filled 2011-12-16: qty 1000

## 2011-12-16 MED ORDER — ROSUVASTATIN CALCIUM 20 MG PO TABS
20.0000 mg | ORAL_TABLET | Freq: Every day | ORAL | Status: DC
  Administered 2011-12-17: 20 mg via ORAL
  Filled 2011-12-16 (×2): qty 1

## 2011-12-16 MED ORDER — FAMOTIDINE IN NACL 20-0.9 MG/50ML-% IV SOLN
INTRAVENOUS | Status: AC
  Filled 2011-12-16: qty 50

## 2011-12-16 MED ORDER — SODIUM CHLORIDE 0.9 % IJ SOLN: 3.0000 mL | INTRAMUSCULAR | Status: DC | PRN

## 2011-12-16 MED ORDER — FENTANYL CITRATE 0.05 MG/ML IJ SOLN
INTRAMUSCULAR | Status: AC
  Filled 2011-12-16: qty 2

## 2011-12-16 MED ORDER — HYDRALAZINE HCL 20 MG/ML IJ SOLN
10.0000 mg | Freq: Once | INTRAMUSCULAR | Status: AC
  Administered 2011-12-16: 10 mg via INTRAVENOUS

## 2011-12-16 MED ORDER — NITROGLYCERIN 0.2 MG/ML ON CALL CATH LAB
INTRAVENOUS | Status: AC
  Filled 2011-12-16: qty 1

## 2011-12-16 MED ORDER — AMLODIPINE BESYLATE 10 MG PO TABS
10.0000 mg | ORAL_TABLET | Freq: Every day | ORAL | Status: DC
  Administered 2011-12-17: 10 mg via ORAL
  Filled 2011-12-16 (×2): qty 1

## 2011-12-16 MED ORDER — VERAPAMIL HCL 2.5 MG/ML IV SOLN
INTRAVENOUS | Status: AC
  Filled 2011-12-16: qty 2

## 2011-12-16 MED ORDER — ASPIRIN 81 MG PO CHEW: 324.0000 mg | CHEWABLE_TABLET | ORAL | Status: DC

## 2011-12-16 MED ORDER — HEPARIN SODIUM (PORCINE) 1000 UNIT/ML IJ SOLN
INTRAMUSCULAR | Status: AC
  Filled 2011-12-16: qty 1

## 2011-12-16 MED ORDER — ASPIRIN 81 MG PO CHEW
81.0000 mg | CHEWABLE_TABLET | Freq: Every day | ORAL | Status: DC
  Administered 2011-12-17: 81 mg via ORAL
  Filled 2011-12-16: qty 1

## 2011-12-16 MED ORDER — HYDRALAZINE HCL 20 MG/ML IJ SOLN
INTRAMUSCULAR | Status: AC
  Filled 2011-12-16: qty 1

## 2011-12-16 MED ORDER — DIAZEPAM 5 MG PO TABS
ORAL_TABLET | ORAL | Status: AC
  Filled 2011-12-16: qty 1

## 2011-12-16 MED ORDER — ASPIRIN 81 MG PO CHEW
CHEWABLE_TABLET | ORAL | Status: AC
  Administered 2011-12-16: 324 mg
  Filled 2011-12-16: qty 4

## 2011-12-16 MED ORDER — SODIUM CHLORIDE 0.9 % IJ SOLN: 3.0000 mL | Freq: Two times a day (BID) | INTRAMUSCULAR | Status: DC

## 2011-12-16 MED ORDER — SODIUM CHLORIDE 0.9 % IV SOLN: INTRAVENOUS | Status: DC

## 2011-12-16 MED ORDER — HYDROCHLOROTHIAZIDE 12.5 MG PO CAPS
12.5000 mg | ORAL_CAPSULE | Freq: Every day | ORAL | Status: DC
  Administered 2011-12-17: 12.5 mg via ORAL
  Filled 2011-12-16 (×2): qty 1

## 2011-12-16 MED ORDER — SODIUM CHLORIDE 0.9 % IV SOLN: 250.0000 mL | INTRAVENOUS | Status: DC | PRN

## 2011-12-16 MED ORDER — TRANDOLAPRIL 4 MG PO TABS
4.0000 mg | ORAL_TABLET | Freq: Every day | ORAL | Status: DC
  Administered 2011-12-17: 4 mg via ORAL
  Filled 2011-12-16 (×2): qty 1

## 2011-12-16 MED ORDER — METOPROLOL SUCCINATE ER 50 MG PO TB24
50.0000 mg | ORAL_TABLET | Freq: Every day | ORAL | Status: DC
  Administered 2011-12-17: 50 mg via ORAL
  Filled 2011-12-16 (×2): qty 1

## 2011-12-16 NOTE — Op Note (Signed)
Cardiac Catheterization Procedure Note  Name: JAELON GATLEY MRN: [id number] DOB: June 12, 1946  Procedure:  1. Abdominal aortogram with bilateral lower extremity runoff 2. Right common iliac orbital atherectomy, PTA, and stenting 3. Left external iliac PTA  Procedural indication: Mr. Baggerly is a 66 year old gentleman with extensive peripheral arterial disease. He underwent left common femoral artery endarterectomy last year. He has developed progressive claudication in the left leg worse than the right leg over the last few months. His walking is extremely limited to short distances in his lower extremity duplex scan demonstrated severe inflow disease bilaterally as well as severe left SFA stenosis. He presents today for angiography and possible intervention.  Procedural details: The right groin was prepped draped and anesthetized with 1% lidocaine. Using the modified Seldinger technique a 5 French sheath was placed in the right femoral artery. A pigtail catheter was advanced into the distal abdominal aorta and oblique views of the aortoiliac region were obtained. A bilateral lower extremity runoff was then performed. This demonstrated critical stenosis at the junction of the left external iliac and common femoral artery above the patch endarterectomy site. There was also severe stenosis in the right iliac artery. I elected to attempt endovascular treatment. There is heavy calcification and I felt orbital atherectomy was indicated.  The patient was given weight-based unfractionated heparin. He was loaded with 600 mg of Plavix. Initially I used a crossover catheter to access the left iliac system. It was difficult to 2 a stent in the right common iliac artery and angulation at the aortic bifurcation. I was able to advance a superstiff Amplatz wire into the distal left external iliac artery and I attempted to advance a 7 French Pinnacle destination sheath across the iliac bifurcation. The sheath would not  cross the right common iliac artery because of severe stenosis in that region. At that point I elected to treat the right common iliac artery with orbital atherectomy. A viper wire was advanced across the lesion and a 2 mm CESI solid crown was used to make multiple atherectomy runs at low, medium, and high speeds. Following atherectomy I was able to advance the sheath proximal to this area. I again made a prolonged attempt to access the left iliac system using multiple wires and catheters. I was unable to cross the lesion in the distal external iliac artery with either an angled Glidewire for a mailman coronary wire. I ultimately was able to cross with a cougar 014 inch coronary wire. The cougar wire was advanced into the distal SFA but I still could not cross the lesion with either balloons or support devices. A 0.018 inch quick cross was attempted unsuccessfully. A 3.0 x 40 mm Fox SV balloon was attempted and would not cross. I was able to finally cross with a 2.5 x 20 mm Maverick over-the-wire coronary balloon. The balloon was inflated to 14 atmospheres on multiple inflations across the lesion in the left external iliac artery. After balloon dilatation I was able to cross the lesion in the external iliac artery with a stiff angled Glidewire. Despite having excellent position with a stiff angled Glidewire down into the deep femoral artery I could not cross the lesion with a quick cross support catheter. I felt like all of her options and been exhausted at that point and abandoned further attempts at treating the left external iliac artery.  Attention was then turned to the right common iliac artery. A versacore wire was advanced across the lesion and the lesion was dilated with  a 6 x 30 mm Foxcroft balloon to 12 atmospheres. The lesion was then stented with an absolute pro 8 x 40 mm self-expanding stent. The stent was post dilated with a 7 x 45 cross balloon to 12 atmospheres with a good result. A pigtail catheter  was advanced into the distal aorta and final aortography was performed with angulated views confirming an good result in the right iliac system.  Procedural findings:  Right leg: There is a patent common iliac stent present. Beyond the stented segment in the common iliac artery there is an eccentric 80% stenosis present.  There is diffuse ovoid calcification in the distal external iliac artery. The common femoral artery is patent. The superficial and deep femoral arteries are patent. There is two-vessel runoff to the right foot via the anterior and posterior tibial arteries.  Left leg: The common iliac artery is patent. The internal and external iliac arteries are patent, but the distal external iliac artery has a critical 95% eccentric, heavily calcified stenosis leading into the patch endarterectomy site. The common femoral artery patch is widely patent. The superficial femoral artery just beyond the patch has severe 80-90% stenosis present without associated calcification. The deep femoral artery is patent. There is two-vessel runoff to the left foot via the anterior and posterior tibial arteries.  Final conclusion: 1. Severe left external iliac artery stenosis with an unsuccessful attempt at percutaneous revascularization 2. Severe right common iliac stenosis was successful orbital atherectomy, PTA, and stenting 3. Severe left superficial femoral artery stenosis just beyond the patch endarterectomy site  Recommendations: The patient has complex and severe residual left external iliac stenosis. Considerations will be attempted brachial access versus outside referral for reattempt at endovascular treatment.    Tonny Bollman 12/16/2011, 6:30 PM

## 2011-12-16 NOTE — Progress Notes (Signed)
ANTICOAGULATION CONSULT NOTE - Initial Consult  Pharmacy Consult for Warfarin Indication: AVR  No Known Allergies  Patient Measurements: Height: 5\' 8"  (172.7 cm) Weight: 176 lb (79.833 kg) IBW/kg (Calculated) : 68.4    Vital Signs: Temp: 97.6 F (36.4 C) (01/09 1834) Temp src: Oral (01/09 1834) BP: 166/71 mmHg (01/09 1834) Pulse Rate: 69  (01/09 1834)  Labs:  Basename 12/16/11 1027  HGB 14.7  HCT 41.9  PLT 210  LABPROT 15.0  INR 1.16  CREATININE 0.65   Estimated Creatinine Clearance: 87.9 ml/min (by C-G formula based on Cr of 0.65).  Medical History: Past Medical History  Diagnosis Date  . Diabetes mellitus   . Hypertension   . Hyperlipidemia   . ITP (idiopathic thrombocytopenic purpura)   . Coronary artery disease     s/p cabg x 3 11/2003: lima-lad, seq vg to rpda and rpl  . Aortic stenosis     s/p st. jude mechanical AVR - Chronic Coumadin  . Claudication   . Diverticulitis of colon   . Hx of colonic polyps   . Peripheral vascular disease     s/p Left external Iliac Artery stenting and subsequent left femoral endarterectomy 02/2011- post op course complicated by wound infxn req I&D 03/2011    Medications:  Prescriptions prior to admission  Medication Sig Dispense Refill  . amLODipine (NORVASC) 5 MG tablet Take 10 mg by mouth daily.       [name] amoxicillin (AMOXIL) 500 MG capsule Take 500 mg by mouth 2 (two) times daily as needed. Taken prior to dental visits      . atorvastatin (LIPITOR) 40 MG tablet Take 40 mg by mouth daily.       [name] enoxaparin (LOVENOX) 80 MG/0.8ML SOLN injection Inject 0.8 mLs (80 mg total) into the skin every 12 (twelve) hours.  10 Syringe  1  . glyBURIDE-metformin (GLUCOVANCE) 2.5-500 MG per tablet Take 2 tablets by mouth 2 (two) times daily at 10 AM and 5 PM.       . hydrochlorothiazide (MICROZIDE) 12.5 MG capsule Take 12.5 mg by mouth daily.        . metoprolol (TOPROL-XL) 50 MG 24 hr tablet Take 50 mg by mouth daily.        .  trandolapril (MAVIK) 4 MG tablet Take 4 mg by mouth daily.        [name] warfarin (COUMADIN) 7.5 MG tablet Take 1 tablet (7.5 mg total) by mouth daily.  35 tablet  3    Assessment: 66 yo male admitted with c/o ongoing symptoms form his claudication disease.  He has an extensive medical history including CAD with mechanical AVR, PVD s/p stenting and endarderectomy.  He is now s/p angiography with atherectomy, PTA and stenting.  There was an unsuccessful attempt at revascularization as noted with plans for continued work-up.  He is currently receiving SQ Lovenox as bridge therapy while he has been off his Warfarin.   I spoke with patient and he confirms that he indeed takes 7.5mg  daily.  I discussed the plans to restart his Warfarin and hopefully get him back on his home dose.  Goal of Therapy:  INR 2.5-3.5   Plan:  1).  Warfarin 10 mg PO x 1 tonight 2).  Check PT/INR and adjust as needed.   Nadara Mustard, PharmD., MS Clinical Pharmacist Pager:  [phone] 12/16/2011,7:07 PM

## 2011-12-16 NOTE — H&P (View-Only) (Signed)
Patient ID: [name] [name], male   DOB: 04/13/1946, 65 y.o.   MRN: [id number] I reviewed the patient's lower extremity duplex findings with him on the telephone. We also had a long discussion at the time of his last office visit that I anticipated the need for repeat angiography.  We reviewed risks and indication for angiography and possible PTA and stenting. He understands this well as he has been through both interventional procedures and vascular surgery. He also has some pain in the right leg and requests that there is much be done as possible at the time of his angiogram. He will need to be bridged with Lovenox for the procedure. I will ask the Coumadin clinic to help with management of this. The patient requests to schedule the procedure in January. 

## 2011-12-16 NOTE — H&P (Signed)
Patient ID: [name] [name] MRN: [id number], DOB/AGE: [birth year]   Admit date: 12/16/2011   Primary Physician: Gwen Pounds, MD, MD Primary Cardiologist: Burna Forts;  Dayle Points (PV)  Pt. Profile:   66 y/o male with h/o cad, as s/p mech avr on coumadin, and pvd s/p l fem stenting and subsequent left ext iliac & fem ea (02/2011) who presents for peripheral angio 2/2 recurrent claudication.  Problem List: Past Medical History  Diagnosis Date  . Diabetes mellitus   . Hypertension   . Hyperlipidemia   . ITP (idiopathic thrombocytopenic purpura)   . Coronary artery disease     s/p cabg x 3 11/2003: lima-lad, seq vg to rpda and rpl  . Aortic stenosis     s/p st. jude mechanical AVR - Chronic Coumadin  . Claudication   . Diverticulitis of colon   . Hx of colonic polyps   . Peripheral vascular disease     s/p Left external Iliac Artery stenting and subsequent left femoral endarterectomy 02/2011- post op course complicated by wound infxn req I&D 03/2011    Past Surgical History  Procedure Date  . Splenectomy   . Coronary artery bypass graft   . Aortic valve replacement   . Angioplasty / stenting iliac     Left external Iliac Artery     Allergies: No Known Allergies  HPI:   66 y/o male with the above complex problem list.  He has a h/o claudication and is s/p prior left iliac stenting and more recently left ext iliac and femoral endarterectomy (02/2011).  Post op course was complicated by wound infxn req I&D.  Since surgery, pt has not noted any significant improvement in claudication Ss.  He is now no longer to play golf, saying that he can only walk about 25 feet on level ground before he has to stop 2/2 L>R claudication Ss.  He saw Dr Excell Seltzer in early December and ABI's were subsequently performed revealing an ABI of 0.66 on the right and low flow through the endarterectomy patch on the left.  Decision was made to pursue periph angio.  He has cont to have claudication.  He came off of  coumadin on 1/6 and has been bridging with lovenox (avr w/ h/o tia).  He has no complaints this am.  Home Medications  Prior to Admission medications   Medication Sig Start Date End Date Taking? Authorizing Provider  amLODipine (NORVASC) 5 MG tablet Take 10 mg by mouth daily.    Yes Historical Provider, MD  amoxicillin (AMOXIL) 500 MG capsule Take 500 mg by mouth 2 (two) times daily as needed. Taken prior to dental visits 08/12/11  Yes Historical Provider, MD  atorvastatin (LIPITOR) 40 MG tablet Take 40 mg by mouth daily.    Yes Historical Provider, MD  enoxaparin (LOVENOX) 80 MG/0.8ML SOLN injection Inject 0.8 mLs (80 mg total) into the skin every 12 (twelve) hours. 12/04/11  Yes [name] Chapman, MD  glyBURIDE-metformin (GLUCOVANCE) 2.5-500 MG per tablet Take 2 tablets by mouth 2 (two) times daily at 10 AM and 5 PM.    Yes Historical Provider, MD  hydrochlorothiazide (MICROZIDE) 12.5 MG capsule Take 12.5 mg by mouth daily.     Yes Historical Provider, MD  metoprolol (TOPROL-XL) 50 MG 24 hr tablet Take 50 mg by mouth daily.     Yes Historical Provider, MD  trandolapril (MAVIK) 4 MG tablet Take 4 mg by mouth daily.   04/23/11 04/22/12 Yes [name] Chapman, MD  warfarin (COUMADIN) 7.5 MG tablet Take 1 tablet (7.5 mg total) by mouth daily. 12/10/11   Wendall Stade, MD   **Coumadin has been on hold since Sunday 1/6.  He has been on a Lovenox bridge since then.  Family History  Problem Relation Age of Onset  . Coronary artery disease Mother     bypass surgery - deceased  . Heart disease Father     murmur, valve replacement - deceased  . Colon cancer Neg Hx   . Breast cancer Sister   . Diabetes      grandmother     History   Social History  . Marital Status: Legally Separated    Spouse Name: N/A    Number of Children: N/A  . Years of Education: N/A   Occupational History  . Not on file.   Social History Main Topics  . Smoking status: Former Smoker -- 1.5 packs/day for 30 years     Types: Cigarettes    Quit date: 12/16/1995  . Smokeless tobacco: Never Used  . Alcohol Use: 7.0 oz/week    14 drink(s) per week     drinks 2 martini's a night  . Drug Use: No  . Sexually Active: Not on file   Other Topics Concern  . Not on file   Social History Narrative   Tries to remain active.  Frequent golfer but claudication limits this.     Review of Systems: General: negative for chills, fever, night sweats or weight changes.  Cardiovascular: negative for chest pain, dyspnea on exertion, edema, orthopnea, palpitations, paroxysmal nocturnal dyspnea.  +++claudication as above - l>r. Dermatological: negative for rash Respiratory: is getting over recent bout of bronchitis - still occas cough. no wheezing Urologic: negative for hematuria Abdominal: negative for nausea, vomiting, diarrhea, bright red blood per rectum, melena, or hematemesis Neurologic: negative for visual changes, syncope, or dizziness All other systems reviewed and are otherwise negative except as noted above.  Physical Exam: Blood pressure 142/60, pulse 72, temperature 97.2 F (36.2 C), temperature source Oral, resp. rate 18, height 5\' 8"  (1.727 m), weight 176 lb (79.833 kg), SpO2 95.00%.  General: Well developed, well nourished, in no acute distress. Head: Normocephalic, atraumatic, sclera non-icteric, no xanthomas, nares are without discharge.  Neck: Supple without bruits or JVD. Lungs:  Resp regular and unlabored, CTA. Heart: RRR no s3, s4.  Mech S2.  Soft sem at rusb.   Abdomen: Soft, non-tender, non-distended, BS + x 4.  Msk:  Strength and tone appears normal for age. Extremities: No clubbing, cyanosis or edema.  Weak femoral pulses (faint on right), left femoral bruit.  Unable to palpate dp's bialt. Neuro: Alert and oriented X 3. Moves all extremities spontaneously. Psych: Normal affect.   Labs:   Results for orders placed during the hospital encounter of 12/16/11 (from the past 72 hour(s))    GLUCOSE, CAPILLARY     Status: Abnormal   Collection Time   12/16/11 10:08 AM      Component Value Range Comment   Glucose-Capillary 192 (*) 70 - 99 (mg/dL)   CBC     Status: Normal   Collection Time   12/16/11 10:27 AM      Component Value Range Comment   WBC 7.8  4.0 - 10.5 (K/uL)    RBC 4.53  4.22 - 5.81 (MIL/uL)    Hemoglobin 14.7  13.0 - 17.0 (g/dL)    HCT 40.9  81.1 - 91.4 (%)    MCV 92.5  78.0 -  100.0 (fL)    MCH 32.5  26.0 - 34.0 (pg)    MCHC 35.1  30.0 - 36.0 (g/dL)    RDW 04.5  40.9 - 81.1 (%)    Platelets 210  150 - 400 (K/uL)   PROTIME-INR     Status: Normal   Collection Time   12/16/11 10:27 AM      Component Value Range Comment   Prothrombin Time 15.0  11.6 - 15.2 (seconds)    INR 1.16  0.00 - 1.49       ASSESSMENT AND PLAN:   1.  Claudication/PVD:  For angiography today.  2.  CAD:  No recent c/p.  Cont home meds.  3.  S/p AVR:  Bridging with lovenox while coumadin on hold.  INR 1.16 today.  4.  Htn:  bp up slightly this am.  Follow.  5.  HL:  On statin.   Signed, Nicolasa Ducking, NP 12/16/2011, 11:21 AM  Patient seen, examined. Available data reviewed. Agree with findings, assessment, and plan as outlined by Ward Givens, NP. Pt independenty examined and interviewed with no additions to make.  Tonny Bollman, M.D.

## 2011-12-16 NOTE — Interval H&P Note (Signed)
History and Physical Interval Note:  12/16/2011 2:59 PM  [name] [name]  has presented today for surgery, with the diagnosis of pvd  The various methods of treatment have been discussed with the patient and family. After consideration of risks, benefits and other options for treatment, the patient has consented to  Procedure(s): ABDOMINAL AORTAGRAM as a surgical intervention .  The patients' history has been reviewed, patient examined, no change in status, stable for surgery.  I have reviewed the patients' chart and labs.  Questions were answered to the patient's satisfaction.     Tonny Bollman

## 2011-12-17 DIAGNOSIS — I739 Peripheral vascular disease, unspecified: Secondary | ICD-10-CM

## 2011-12-17 LAB — URINALYSIS, ROUTINE W REFLEX MICROSCOPIC
Bilirubin Urine: NEGATIVE
Ketones, ur: 15 mg/dL — AB
Specific Gravity, Urine: 1.041 — ABNORMAL HIGH (ref 1.005–1.030)
Urobilinogen, UA: 1 mg/dL (ref 0.0–1.0)
pH: 6.5 (ref 5.0–8.0)

## 2011-12-17 LAB — PROTIME-INR
INR: 1.11 (ref 0.00–1.49)
Prothrombin Time: 14.5 seconds (ref 11.6–15.2)

## 2011-12-17 LAB — GLUCOSE, CAPILLARY: Glucose-Capillary: 187 mg/dL — ABNORMAL HIGH (ref 70–99)

## 2011-12-17 LAB — URINE MICROSCOPIC-ADD ON

## 2011-12-17 MED ORDER — CLOPIDOGREL BISULFATE 75 MG PO TABS: 75.0000 mg | ORAL_TABLET | Freq: Every day | ORAL | Status: DC

## 2011-12-17 NOTE — Progress Notes (Signed)
Subjective:  No chest pain, dyspnea, or complaints. Eager to go home.  Objective:  Vital Signs in the last 24 hours: Temp:  [97.6 F (36.4 C)-99.6 F (37.6 C)] 98.2 F (36.8 C) (01/10 0827) Pulse Rate:  [69-82] 77  (01/10 0827) Resp:  [15-25] 20  (01/10 0827) BP: (121-172)/(37-91) 144/55 mmHg (01/10 0827) SpO2:  [93 %-96 %] 96 % (01/10 0827) Weight:  [80.1 kg (176 lb 9.4 oz)] 80.1 kg (176 lb 9.4 oz) (01/10 0600)  Intake/Output from previous day: 01/09 0701 - 01/10 0700 In: -  Out: 2100 [Urine:2100]  Physical Exam: Pt is alert and oriented, NAD HEENT: normal Neck: JVP - normal, carotids 2+= without bruits Lungs: CTA bilaterally CV: RRR with normal mechanical mitral valve sounds Abd: soft, NT, Positive BS, no hepatomegaly Ext: no C/C/E, right fem pulse 2+, PT 1+, left fem trace and left pedals absent. Skin: warm/dry no rash   Lab Results:  Basename 12/16/11 1027  WBC 7.8  HGB 14.7  PLT 210    Basename 12/16/11 1027  NA 141  K 4.2  CL 103  CO2 27  GLUCOSE 192*  BUN 13  CREATININE 0.65   No results found for this basename: TROPONINI:2,CK,MB:2 in the last 72 hours  Assessment/Plan:  Severe PAD with intermittent claudication. Successful atherectomy/stenting of the left common iliac. Unsuccessful PTA of the left external iliac.   Recommend: Referral to Hoy Finlay, MD at [facility] for repeat attempt at PTA/stenting of the left ext iliac. The patient should remain on plavix and coumadin without aspirin. He will be bridged with lovenox and I asked him to resume this tonight for his PM dose. He needs followup in the [facility] coumadin clinic early next week and should continue lovenox until that time. The referral to Dr Pernell Dupre has been made and his office will contact the patient.  Tonny Bollman, M.D. 12/17/2011, 10:11 AM

## 2011-12-17 NOTE — Progress Notes (Signed)
Inpatient Diabetes Program Recommendations  AACE/ADA: New Consensus Statement on Inpatient Glycemic Control (2009)  Target Ranges:  Prepandial:   less than 140 mg/dL      Peak postprandial:   less than 180 mg/dL (1-2 hours)      Critically ill patients:  140 - 180 mg/dL   Reason for Visit: Results for [name] [name], [name] [name] (MRN [id number]) as of 12/17/2011 09:35  Ref. Range 12/16/2011 10:08 12/16/2011 18:02 12/16/2011 18:38 12/16/2011 21:55 12/17/2011 08:19  Glucose-Capillary Latest Range: 70-99 mg/dL 469 (H) 629 (H) 528 (H) 138 (H) 187 (H)    Inpatient Diabetes Program Recommendations Correction (SSI): Please add Novolog correction moderate tid wc while in the hospital. HgbA1C: Please check to assess glycemic control.  Note: Will follow.

## 2011-12-17 NOTE — Discharge Summary (Signed)
Discharge Summary   Patient ID: [name] [name],  MRN: [id number], DOB/AGE: January 19, 1946 66 y.o.  Admit date: 12/16/2011 Discharge date: 12/17/2011  Discharge Diagnoses Principal Problem:  *CLAUDICATION Active Problems:  DIABETES MELLITUS  HYPERCHOLESTEROLEMIA  HYPERTENSION  Coronary artery disease  Aortic stenosis  Peripheral vascular disease  ITP (idiopathic thrombocytopenic purpura)   Allergies No Known Allergies  Procedure:  1. Abdominal aortogram with bilateral lower extremity runoff  2. Right common iliac orbital atherectomy, PTA, and stenting  3. Left external iliac PTA  Right leg: There is a patent common iliac stent present. Beyond the stented segment in the common iliac artery there is an eccentric 80% stenosis present. There is diffuse ovoid calcification in the distal external iliac artery. The common femoral artery is patent. The superficial and deep femoral arteries are patent. There is two-vessel runoff to the right foot via the anterior and posterior tibial arteries.  Left leg: The common iliac artery is patent. The internal and external iliac arteries are patent, but the distal external iliac artery has a critical 95% eccentric, heavily calcified stenosis leading into the patch endarterectomy site. The common femoral artery patch is widely patent. The superficial femoral artery just beyond the patch has severe 80-90% stenosis present without associated calcification. The deep femoral artery is patent. There is two-vessel runoff to the left foot via the anterior and posterior tibial arteries.  Final conclusion:  1. Severe left external iliac artery stenosis with an unsuccessful attempt at percutaneous revascularization  2. Severe right common iliac stenosis was successful orbital atherectomy, PTA, and stenting  3. Severe left superficial femoral artery stenosis just beyond the patch endarterectomy site  Recommendations: The patient has complex and severe residual left  external iliac stenosis. Considerations will be attempted brachial access versus outside referral for reattempt at endovascular treatment.  History of Present Illness:  [name] [name] is a 66 yo Caucasian male with PMHx significant for CAD, severe AS s/p mechanical AVR (on chronic Coumadin), PVD s/p L femoral stenting and subsequent left external iliac and femoral endarterectmy (03/12) who presented on 01/09 for elective peripheral angiogram 2/2 recurrent claudication.   Hospital Course:   He was informed, consented and agreed to the above procedure revealing the described details including severe L external iliac artery stenosis with unsuccessful attempt at percutaneous revascularization, severe R common iliac stenosis with successful orbital atherectomy, PTA and stenting and severe L superficial femoral artery stenosis just beyond the patch endarterectomy site. He tolerated the procedure well. Regarding the unsuccessful intervention of the L external iliac artery, he will see Dr. Tawni Levy for repeat attempt at PTA/stenting of the site.   He was asymptomatic during his admission. He is stable, at baseline and will be discharged home today. He will resume Coumadin and Plavix. He was advised to discontinue Aspirin. INR was subtherapeutic for this procedure, and he had been on Lovenox. He will continue with Lovenox bridge outpatient and await further recommendations at his follow-up Coumadin clinic appointment on 01/14. He will be contacted regarding the repeated attempt of PTA/stenting of L ext iliac lesion. He will continue all other outpatient meds.   Discharge Vitals:  Blood pressure 144/55, pulse 77, temperature 98.2 F (36.8 C), temperature source Oral, resp. rate 20, height 5\' 8"  (1.727 m), weight 80.1 kg (176 lb 9.4 oz), SpO2 96.00%.   Labs: Recent Labs  Basename 12/16/11 1027   WBC 7.8   HGB 14.7   HCT 41.9   MCV 92.5   PLT 210  Lab 12/16/11 1027  NA 141  K 4.2  CL 103  CO2 27   BUN 13  CREATININE 0.65  CALCIUM 9.2  PROT --  BILITOT --  ALKPHOS --  ALT --  AST --  AMYLASE --  LIPASE --  GLUCOSE 192*   Disposition:  Discharge Orders    Future Appointments: Provider: Department: Dept Phone: Center:   12/21/2011 3:00 PM Raul Del, RN [facility] [phone] None     Follow-up Information    Follow up with [facility] on 12/21/2011. (At 3:00 PM. )    Contact information:   [address] [place] [address]         Discharge Medications:  Medication List  As of 12/17/2011 10:49 AM   START taking these medications         clopidogrel 75 MG tablet   Commonly known as: PLAVIX   Take 1 tablet (75 mg total) by mouth daily with breakfast.         CONTINUE taking these medications         amLODipine 5 MG tablet   Commonly known as: NORVASC      amoxicillin 500 MG capsule   Commonly known as: AMOXIL      atorvastatin 40 MG tablet   Commonly known as: LIPITOR      enoxaparin 80 MG/0.8ML Soln injection   Commonly known as: LOVENOX   Inject 0.8 mLs (80 mg total) into the skin every 12 (twelve) hours.      glyBURIDE-metformin 2.5-500 MG per tablet   Commonly known as: GLUCOVANCE      hydrochlorothiazide 12.5 MG capsule   Commonly known as: MICROZIDE      metoprolol 50 MG 24 hr tablet   Commonly known as: TOPROL-XL      trandolapril 4 MG tablet   Commonly known as: MAVIK      warfarin 7.5 MG tablet   Commonly known as: COUMADIN   Take 1 tablet (7.5 mg total) by mouth daily.          Where to get your medications    These are the prescriptions that you need to pick up.   You may get these medications from any pharmacy.         clopidogrel 75 MG tablet           Outstanding Labs/Studies: None  Duration of Discharge Encounter: 40 minutes including physician time.  Signed, R. Hurman Horn, PA-C 12/17/2011, 10:49 AM  See my progress this same date. [name] [name] 12/17/2011 4:17  PM

## 2011-12-21 ENCOUNTER — Ambulatory Visit (INDEPENDENT_AMBULATORY_CARE_PROVIDER_SITE_OTHER): Payer: Medicare Other | Admitting: *Deleted

## 2011-12-21 DIAGNOSIS — G459 Transient cerebral ischemic attack, unspecified: Secondary | ICD-10-CM | POA: Diagnosis not present

## 2011-12-21 DIAGNOSIS — I359 Nonrheumatic aortic valve disorder, unspecified: Secondary | ICD-10-CM | POA: Diagnosis not present

## 2011-12-21 LAB — POCT INR: INR: 2

## 2011-12-28 ENCOUNTER — Telehealth: Payer: Self-pay | Admitting: Cardiovascular Disease

## 2011-12-28 ENCOUNTER — Other Ambulatory Visit: Payer: Self-pay | Admitting: Cardiovascular Disease

## 2011-12-28 DIAGNOSIS — I739 Peripheral vascular disease, unspecified: Secondary | ICD-10-CM | POA: Diagnosis not present

## 2011-12-28 DIAGNOSIS — I10 Essential (primary) hypertension: Secondary | ICD-10-CM

## 2011-12-28 MED ORDER — TRANDOLAPRIL 4 MG PO TABS: 4.0000 mg | ORAL_TABLET | Freq: Every day | ORAL | Status: DC

## 2011-12-28 NOTE — Telephone Encounter (Signed)
PT CALLED RE  HAVING ABD ANGIOGRAM WITH DR ADAMS IN  [facility]  IS NEEDING TO BE BRIDGED WITH LOVENOX  PROCEDURE IS FOR 01-07-12. PT AWARE WILL FORWARD MESSAGE  TO SALLY PUTT./CY

## 2011-12-28 NOTE — Telephone Encounter (Signed)
New Problem   Patient requesting return call from nurse CY regarding angiogram procedure and coumadin medication.  Patient can be reached at hm#

## 2011-12-30 MED ORDER — ENOXAPARIN SODIUM 120 MG/0.8ML ~~LOC~~ SOLN: 120.0000 mg | SUBCUTANEOUS | Status: DC

## 2011-12-30 NOTE — Telephone Encounter (Signed)
1/26 - Take last dose of Coumadin (1 tablet) 1/27 - No Coumadin, no Lovenox 1/28 - Lovenox 120 mg subcutaneously in the AM.  No Coumadin 1/29 - Lovenox 120 mg subcutaneously in the AM.  No Coumadin 1/30 - Lovenox 120 mg subcutaneously in the AM.  No Coumadin 1/31 - Day of procedure - No Lovenox, Coumadin 11.25 mg (1 and 1/2 tablets) in the PM 2/1 - Lovenox 120 mg subcutaneously in the AM, Coumadin 11.25 mg (1 and 1/2 tablets) in the PM 2/2 - Lovenox 120 mg subcutaneously in the AM, Coumadin 7.5 mg (1 tablet) in the PM 2/3 - Lovenox 120 mg subcutaneously in the AM, Coumadin 7.5 mg (1 tablet) in the PM 2/4 - Lovenox 120 mg subcutaneously in the AM, Coumadin 7.5 mg (1 tablet) in the PM 2/5 - Recheck INR in Coumadin clinic at _____.

## 2012-01-04 DIAGNOSIS — Z01812 Encounter for preprocedural laboratory examination: Secondary | ICD-10-CM | POA: Diagnosis not present

## 2012-01-04 DIAGNOSIS — Z9889 Other specified postprocedural states: Secondary | ICD-10-CM | POA: Diagnosis not present

## 2012-01-04 DIAGNOSIS — I70209 Unspecified atherosclerosis of native arteries of extremities, unspecified extremity: Secondary | ICD-10-CM | POA: Diagnosis not present

## 2012-01-04 DIAGNOSIS — Z7901 Long term (current) use of anticoagulants: Secondary | ICD-10-CM | POA: Diagnosis not present

## 2012-01-04 DIAGNOSIS — R0989 Other specified symptoms and signs involving the circulatory and respiratory systems: Secondary | ICD-10-CM | POA: Diagnosis not present

## 2012-01-07 DIAGNOSIS — E119 Type 2 diabetes mellitus without complications: Secondary | ICD-10-CM | POA: Diagnosis not present

## 2012-01-07 DIAGNOSIS — Z87891 Personal history of nicotine dependence: Secondary | ICD-10-CM | POA: Diagnosis not present

## 2012-01-07 DIAGNOSIS — Z9089 Acquired absence of other organs: Secondary | ICD-10-CM | POA: Diagnosis not present

## 2012-01-07 DIAGNOSIS — E7800 Pure hypercholesterolemia, unspecified: Secondary | ICD-10-CM | POA: Diagnosis not present

## 2012-01-07 DIAGNOSIS — Z954 Presence of other heart-valve replacement: Secondary | ICD-10-CM | POA: Diagnosis not present

## 2012-01-07 DIAGNOSIS — I70219 Atherosclerosis of native arteries of extremities with intermittent claudication, unspecified extremity: Secondary | ICD-10-CM | POA: Diagnosis not present

## 2012-01-07 DIAGNOSIS — I10 Essential (primary) hypertension: Secondary | ICD-10-CM | POA: Diagnosis not present

## 2012-01-07 DIAGNOSIS — Z7901 Long term (current) use of anticoagulants: Secondary | ICD-10-CM | POA: Diagnosis not present

## 2012-01-07 DIAGNOSIS — I700 Atherosclerosis of aorta: Secondary | ICD-10-CM | POA: Diagnosis not present

## 2012-01-08 ENCOUNTER — Telehealth: Payer: Self-pay | Admitting: Cardiovascular Disease

## 2012-01-08 NOTE — Telephone Encounter (Signed)
I spoke with the pt and he would actually like to speak with Dr Eden Emms about his vascular situation.  The pt said he saw Dr Hoy Finlay at [facility] and did undergo an abdominal aortogram and possible intervention this week that was unsuccessful.  Dr Pernell Dupre told the pt that he needed to have bypass and recommended that he see a Vascular Surgeon at [facility].  The pt would like to be seen by a Vascular surgeon in [place].  The pt said his foot has now gone numb and he would like to see a surgeon ASAP. The pt also said Dr Pernell Dupre checked his endarterectomy site and this is occluded.  The pt said that Dr Eden Emms knows about his vascular issues and is aware of his previous issues with a Vasular physician. I spoke with Wynona Canes LPN and will forward this information to Dr Eden Emms to review.

## 2012-01-08 NOTE — Telephone Encounter (Signed)
Spoke with patient at length tonight.  He has exhausted percutaneous options.  Saw a surgeon at [facility] and needs open repair/vascular surgery fairly soon as he has paresthesias.  He will not see Dr Darrick Penna.  I will see if Dr Arbie Cookey is willing to see him and do his surgery.  All of his "support" system is in [place] and this makes him want to have surgery here

## 2012-01-08 NOTE — Telephone Encounter (Signed)
New Problem   Patient would like a return call from nurse regarding a vascular surgeon.  Please return call to patient at hm#

## 2012-01-11 DIAGNOSIS — E119 Type 2 diabetes mellitus without complications: Secondary | ICD-10-CM | POA: Diagnosis not present

## 2012-01-11 DIAGNOSIS — H2510 Age-related nuclear cataract, unspecified eye: Secondary | ICD-10-CM | POA: Diagnosis not present

## 2012-01-11 NOTE — Telephone Encounter (Signed)
Make sure he has F/U with Dr Early in the next week or two.  I sent Dr Early a copy of this telephone note

## 2012-01-12 ENCOUNTER — Encounter: Payer: Self-pay | Admitting: Vascular Surgery

## 2012-01-12 ENCOUNTER — Ambulatory Visit (INDEPENDENT_AMBULATORY_CARE_PROVIDER_SITE_OTHER): Payer: Medicare Other | Admitting: Vascular Surgery

## 2012-01-12 ENCOUNTER — Ambulatory Visit (INDEPENDENT_AMBULATORY_CARE_PROVIDER_SITE_OTHER): Payer: Medicare Other | Admitting: *Deleted

## 2012-01-12 VITALS — BP 176/73 | HR 89 | Resp 18 | Ht 68.0 in | Wt 178.1 lb

## 2012-01-12 DIAGNOSIS — I359 Nonrheumatic aortic valve disorder, unspecified: Secondary | ICD-10-CM

## 2012-01-12 DIAGNOSIS — G459 Transient cerebral ischemic attack, unspecified: Secondary | ICD-10-CM

## 2012-01-12 DIAGNOSIS — I70219 Atherosclerosis of native arteries of extremities with intermittent claudication, unspecified extremity: Secondary | ICD-10-CM

## 2012-01-12 LAB — POCT INR: INR: 1.9

## 2012-01-12 MED ORDER — ENOXAPARIN SODIUM 120 MG/0.8ML ~~LOC~~ SOLN: 120.0000 mg | SUBCUTANEOUS | Status: DC

## 2012-01-12 NOTE — Telephone Encounter (Signed)
Referral form faxed to [facility].

## 2012-01-12 NOTE — Progress Notes (Signed)
[facility]   Patient name: [name] [name] MRN: [id number] DOB: 01/03/1946 Sex: male   Referred by: Nishan  Reason for referral:  Chief Complaint  Patient presents with  . Follow-up    c/o left leg pain and numbness left foot  . PVD    HISTORY OF PRESENT ILLNESS: The patient presents today as an urgent add-on to 2 progressive left leg ischemia. He has an extensive past history. He has had a history of iliac angioplasty and left external iliac and common femoral artery endarterectomy by Dr. fields in March of 2012 he had a postoperative difficulty with healing and an infected lymphocele of which eventually healed after VAC closure. He had progressive claudication symptoms worse on the left and on the right. He recently underwent repeat arteriogram by Dr. Cooper on 12/16/2011. I have reviewed these films. At that time he had a right iliac stenting and reports that he has had improvement in his right leg symptoms. He extensive has extensive claudication of his left leg and also has classic rest pain in his foot. He reports that he has awakened at night and will walk in the room and said with his leg dangling for relief. He saw a urgent [facility] [place] for second opinion and apparently had a repeat arteriogram. Their recommendation was surgical revision. He is seen today for further discussion of this. He does not have any tissue loss.  Past Medical History  Diagnosis Date  . Diabetes mellitus   . Hypertension   . Hyperlipidemia   . ITP (idiopathic thrombocytopenic purpura)   . Coronary artery disease     s/p cabg x 3 11/2003: lima-lad, seq vg to rpda and rpl  . Aortic stenosis     s/p st. jude mechanical AVR - Chronic Coumadin  . Claudication   . Diverticulitis of colon   . Hx of colonic polyps   . Peripheral vascular disease     s/p Left external Iliac Artery stenting and subsequent left femoral endarterectomy 02/2011- post op course complicated  by wound infxn req I&D 03/2011  . Bronchitis 12/17/11    "just gettin over a case"  . Anemia   . Blood transfusion     Past Surgical History  Procedure Date  . Splenectomy ~ 2003  . Coronary artery bypass graft   . Aortic valve replacement ~ 2004  . Angioplasty / stenting iliac     Left external Iliac Artery  . Coronary angioplasty with stent placement 12/16/11    "1"  . Cardiac valve replacement   . Tonsillectomy ~ 1952    History   Social History  . Marital Status: Legally Separated    Spouse Name: N/A    Number of Children: N/A  . Years of Education: N/A   Occupational History  . Not on file.   Social History Main Topics  . Smoking status: Former Smoker -- 1.5 packs/day for 30 years    Types: Cigarettes    Quit date: 12/15/1993  . Smokeless tobacco: Never Used  . Alcohol Use: 7.0 oz/week    14 drink(s) per week     drinks 2 martini's a night  . Drug Use: No  . Sexually Active: Yes   Other Topics Concern  . Not on file   Social History Narrative   Tries to remain active.  Frequent golfer but claudication limits this.    Family History  Problem Relation Age of Onset  . Coronary artery disease   Mother     bypass surgery - deceased  . Heart disease Father     murmur, valve replacement - deceased  . Colon cancer Neg Hx   . Breast cancer Sister   . Diabetes      grandmother    Allergies as of 01/12/2012  . (No Known Allergies)    Current Outpatient Prescriptions on File Prior to Visit  Medication Sig Dispense Refill  . amLODipine (NORVASC) 5 MG tablet Take 10 mg by mouth daily.       . amoxicillin (AMOXIL) 500 MG capsule Take 500 mg by mouth 2 (two) times daily as needed. Taken prior to dental visits      . atorvastatin (LIPITOR) 40 MG tablet Take 40 mg by mouth daily.       . glyBURIDE-metformin (GLUCOVANCE) 2.5-500 MG per tablet Take 2 tablets by mouth 2 (two) times daily at 10 AM and 5 PM.       . hydrochlorothiazide (MICROZIDE) 12.5 MG capsule Take  12.5 mg by mouth daily.        . metoprolol (TOPROL-XL) 50 MG 24 hr tablet Take 50 mg by mouth daily.        . trandolapril (MAVIK) 4 MG tablet Take 1 tablet (4 mg total) by mouth daily.  30 tablet  6  . warfarin (COUMADIN) 7.5 MG tablet Take 1 tablet (7.5 mg total) by mouth daily.  35 tablet  3  . clopidogrel (PLAVIX) 75 MG tablet Take 1 tablet (75 mg total) by mouth daily with breakfast.  30 tablet  3  . enoxaparin (LOVENOX) 120 MG/0.8ML SOLN Inject 0.8 mLs (120 mg total) into the skin daily.  1 Syringe  0     REVIEW OF SYSTEMS:  Positives indicated with an "X"  CARDIOVASCULAR:  [ ] chest pain   [ ] chest pressure   [ ] palpitations   [ ] orthopnea   [ ] dyspnea on exertion   [x ] claudication   [x ] rest pain   [ ] DVT   [ ] phlebitis PULMONARY:   [ ] productive cough   [ ] asthma   [ ] wheezing NEUROLOGIC:   [ ] weakness  [ ] paresthesias  [ ] aphasia  [ ] amaurosis  [ ] dizziness HEMATOLOGIC:   [ ] bleeding problems   [ ] clotting disorders MUSCULOSKELETAL:  [ ] joint pain   [ ] joint swelling GASTROINTESTINAL: [ ]  blood in stool  [ ]  hematemesis GENITOURINARY:  [ ]  dysuria  [ ]  hematuria PSYCHIATRIC:  [ ] history of major depression INTEGUMENTARY:  [ ] rashes  [ ] ulcers CONSTITUTIONAL:  [ ] fever   [ ] chills  PHYSICAL EXAMINATION:  General: The patient is a well-nourished male, in no acute distress. Vital signs are BP 176/73  Pulse 89  Resp 18  Ht 5' 8" (1.727 m)  Wt 178 lb 1.6 oz (80.786 kg)  BMI 27.08 kg/m2 Pulmonary: There is a good air exchange bilaterally without wheezing or rales. Abdomen: Soft and non-tender with normal pitch bowel sounds. Musculoskeletal: There are no major deformities.  There is no significant extremity pain. Neurologic: No focal weakness or paresthesias are detected, Skin: There are no ulcer or rashes noted. Psychiatric: The patient has normal affect. Cardiovascular: There is a regular rate and rhythm without significant murmur  appreciated. Pulse status: Palpable femoral pulses bilaterally. Absent left popliteal and distal pulses.   Aortogram with runoff   from 12/16/2011: Distal right common iliac artery angioplasty and stenting  Eccentric plaque in the external iliac artery just above the prior endarterectomy and high-grade smooth stenosis in the superficial femoral artery below the prior patch over approximately 3-4 cm.  Impression and Plan:  Had a long discussion with Mr. Kintz. He is unable to tolerate this level of claudication and also has clearcut rest pain. I do not see any endovascular treatment options. I did images saphenous vein with SonoSite and this does show patency of the saphenous vein on the right from his vein harvest site at the knee distally. He does have extensive varicosities of the saphenous vein itself appears to be acceptable for bypass. I explained that concern regarding his prior extensive wound issues in his left groin and the difficulty for exposure and potential for infectious complications with a redo. I explained that he would require a endarterectomy above the old area high up in the under the inguinal ligament and also a treatment down onto the superficial femoral artery. The scope and potentially all be taken care of with a long vein patch is the most durable option. Did explain the potential for failure of this with recurrent ischemic symptoms. He requested I assume his vascular surgical care which I agreed to. I certainly explained that the he has a challenging anatomy and the difficulty with the prior groin wound complications. He understands and wishes to proceed we will coordinate his Lovenox bridge off of his Coumadin and plan surgery for 01/18/2012    Sela Falk [facility] Office: [phone]        

## 2012-01-12 NOTE — Telephone Encounter (Signed)
The pt was seen by Dr Early today.

## 2012-01-13 ENCOUNTER — Encounter (HOSPITAL_COMMUNITY): Payer: Self-pay | Admitting: Pharmacy Technician

## 2012-01-14 ENCOUNTER — Other Ambulatory Visit: Payer: Self-pay

## 2012-01-15 ENCOUNTER — Other Ambulatory Visit: Payer: Self-pay

## 2012-01-15 ENCOUNTER — Encounter (HOSPITAL_COMMUNITY)
Admission: RE | Admit: 2012-01-15 | Discharge: 2012-01-15 | Disposition: A | Discharge status: Home / Self Care | Payer: Medicare Other | Source: Ambulatory Visit | Attending: Vascular Surgery | Admitting: Vascular Surgery

## 2012-01-15 ENCOUNTER — Encounter (HOSPITAL_COMMUNITY): Payer: Self-pay

## 2012-01-15 LAB — COMPREHENSIVE METABOLIC PANEL
ALT: 18 U/L (ref 0–53)
Albumin: 3.7 g/dL (ref 3.5–5.2)
Alkaline Phosphatase: 69 U/L (ref 39–117)
Potassium: 4.1 mEq/L (ref 3.5–5.1)
Sodium: 136 mEq/L (ref 135–145)
Total Protein: 6.9 g/dL (ref 6.0–8.3)

## 2012-01-15 LAB — DIFFERENTIAL
Eosinophils Absolute: 0.1 10*3/uL (ref 0.0–0.7)
Eosinophils Relative: 1 % (ref 0–5)
Lymphs Abs: 1.9 10*3/uL (ref 0.7–4.0)
Monocytes Absolute: 0.8 10*3/uL (ref 0.1–1.0)
Monocytes Relative: 10 % (ref 3–12)

## 2012-01-15 LAB — URINALYSIS, ROUTINE W REFLEX MICROSCOPIC
Bilirubin Urine: NEGATIVE
Nitrite: NEGATIVE
Specific Gravity, Urine: 1.012 (ref 1.005–1.030)
Urobilinogen, UA: 0.2 mg/dL (ref 0.0–1.0)

## 2012-01-15 LAB — BLOOD GAS, ARTERIAL
Bicarbonate: 28.9 mEq/L — ABNORMAL HIGH (ref 20.0–24.0)
FIO2: 0.21 %
Patient temperature: 98.6
pCO2 arterial: 41.2 mmHg (ref 35.0–45.0)
pH, Arterial: 7.46 — ABNORMAL HIGH (ref 7.350–7.450)

## 2012-01-15 LAB — CBC
Hemoglobin: 14.1 g/dL (ref 13.0–17.0)
MCHC: 34.4 g/dL (ref 30.0–36.0)
RDW: 13.6 % (ref 11.5–15.5)

## 2012-01-15 LAB — PROTIME-INR: INR: 1.36 (ref 0.00–1.49)

## 2012-01-15 NOTE — Pre-Procedure Instructions (Signed)
[address] [place]  01/15/2012   Your procedure is scheduled on:  Jan 18, 2012 (Monday)  Report to [facility] at 0530 AM.  Call this number if you have problems the morning of surgery: [phone]   Remember:   Do not eat food:After Midnight.  May have clear liquids: up to 4 Hours before arrival.  Clear liquids include soda, tea, black coffee, apple or grape juice, broth.  Take these medicines the morning of surgery with A SIP OF WATER: norvasc, atorvastatin, toprol   ( lovenox and coumadin as directed by office)   Do not wear jewelry, make-up or nail polish.  Do not wear lotions, powders, or perfumes. You may wear deodorant.  Do not shave 48 hours prior to surgery.  Do not bring valuables to the hospital.  Contacts, dentures or bridgework may not be worn into surgery.  Leave suitcase in the car. After surgery it may be brought to your room.  For patients admitted to the hospital, checkout time is 11:00 AM the day of discharge.   Patients discharged the day of surgery will not be allowed to drive home.  Name and phone number of your driver: NA  Special Instructions: CHG Shower Use Special Wash: 1/2 bottle night before surgery and 1/2 bottle morning of surgery.   Please read over the following fact sheets that you were given: Pain Booklet, Blood Transfusion Information and Surgical Site Infection Prevention

## 2012-01-15 NOTE — Consult Note (Signed)
Anesthesia:  Patient is a 66 year old males for AFBG and possible left FPBG on 01/18/12.  This chart was not given to me to review until after 1700 01/15/12 (Friday).  History includes DM2, HTN, HLD, ITP, CAD/AS s/p CABG/St. Jude AVR '04, PVD, anemia, former smoker, and history of TIA in 10/2010 when his INR was subtherapeutic.    His Cardiologist is Dr. Eden Emms who referred him to Dr. Arbie Cookey for for surgical intervention.  Dr. Excell Seltzer had been following his PVD.  His EKG from 11/09/11 showed NSR with nonspecific T wave abnormality.  Echo on 09/09/09 showed: 1. Left ventricle: The cavity size was normal. Wall thickness was normal. Systolic function was normal. The estimated ejection fraction was in the range of 55% to 60%. 2. Aortic valve: Normal appearing AVR Valve area: 1.25cm^2(VTI). Valve area: 0.95cm^2 (Vmax). 3. Left atrium: The atrium was mildly dilated. 4. Atrial septum: No defect or patent foramen ovale was identified.  The last stress test that I can find is from 04/27/07 and showed septal wall motion abnormality presumed related to prior cardiac surgery, but no scar or ischemia.  EF 61%.  His last cardiac cath was from 10/23/03 prior to his cardiac surgery.  Labs noted.  His INR is normal, but he is on a Lovenox bridge. He will get a CBG on arrival.  Blood Bank has also requested that his T & S be redrawn.  CXR from 03/03/11 showed stable hyperaeration, but no acute findings.  Anticipate that he can proceed as scheduled.

## 2012-01-17 MED ORDER — DEXTROSE 5 % IV SOLN
1.5000 g | Freq: Three times a day (TID) | INTRAVENOUS | Status: DC
  Filled 2012-01-17 (×3): qty 1.5

## 2012-01-17 MED ORDER — DEXTROSE 5 % IV SOLN
1.5000 g | INTRAVENOUS | Status: DC
  Filled 2012-01-17: qty 1.5

## 2012-01-17 MED ORDER — SODIUM CHLORIDE 0.9 % IV SOLN: INTRAVENOUS | Status: DC

## 2012-01-18 ENCOUNTER — Encounter (HOSPITAL_COMMUNITY)
Admission: RE | Disposition: A | Discharge status: Home / Self Care | Payer: Self-pay | Source: Ambulatory Visit | Attending: Vascular Surgery

## 2012-01-18 ENCOUNTER — Other Ambulatory Visit: Payer: Self-pay

## 2012-01-18 ENCOUNTER — Inpatient Hospital Stay (HOSPITAL_COMMUNITY): Payer: Medicare Other

## 2012-01-18 ENCOUNTER — Ambulatory Visit (HOSPITAL_COMMUNITY): Payer: Medicare Other | Admitting: Vascular Surgery

## 2012-01-18 ENCOUNTER — Encounter (HOSPITAL_COMMUNITY): Payer: Self-pay | Admitting: *Deleted

## 2012-01-18 ENCOUNTER — Inpatient Hospital Stay (HOSPITAL_COMMUNITY)
Admission: RE | Admit: 2012-01-18 | Discharge: 2012-01-22 | DRG: 238 | Disposition: A | Discharge status: Home / Self Care | Payer: Medicare Other | Source: Ambulatory Visit | Attending: Vascular Surgery | Admitting: Vascular Surgery

## 2012-01-18 ENCOUNTER — Encounter (HOSPITAL_COMMUNITY): Payer: Self-pay | Admitting: Vascular Surgery

## 2012-01-18 DIAGNOSIS — Z9861 Coronary angioplasty status: Secondary | ICD-10-CM | POA: Diagnosis not present

## 2012-01-18 DIAGNOSIS — Z09 Encounter for follow-up examination after completed treatment for conditions other than malignant neoplasm: Secondary | ICD-10-CM | POA: Diagnosis not present

## 2012-01-18 DIAGNOSIS — E119 Type 2 diabetes mellitus without complications: Secondary | ICD-10-CM | POA: Diagnosis present

## 2012-01-18 DIAGNOSIS — I2510 Atherosclerotic heart disease of native coronary artery without angina pectoris: Secondary | ICD-10-CM | POA: Diagnosis present

## 2012-01-18 DIAGNOSIS — Z951 Presence of aortocoronary bypass graft: Secondary | ICD-10-CM | POA: Diagnosis not present

## 2012-01-18 DIAGNOSIS — I739 Peripheral vascular disease, unspecified: Secondary | ICD-10-CM

## 2012-01-18 DIAGNOSIS — I10 Essential (primary) hypertension: Secondary | ICD-10-CM | POA: Diagnosis present

## 2012-01-18 DIAGNOSIS — I708 Atherosclerosis of other arteries: Secondary | ICD-10-CM | POA: Diagnosis not present

## 2012-01-18 DIAGNOSIS — I700 Atherosclerosis of aorta: Principal | ICD-10-CM | POA: Diagnosis present

## 2012-01-18 DIAGNOSIS — Z79899 Other long term (current) drug therapy: Secondary | ICD-10-CM

## 2012-01-18 DIAGNOSIS — Z954 Presence of other heart-valve replacement: Secondary | ICD-10-CM

## 2012-01-18 DIAGNOSIS — Z01812 Encounter for preprocedural laboratory examination: Secondary | ICD-10-CM

## 2012-01-18 DIAGNOSIS — I70219 Atherosclerosis of native arteries of extremities with intermittent claudication, unspecified extremity: Secondary | ICD-10-CM | POA: Diagnosis not present

## 2012-01-18 DIAGNOSIS — D649 Anemia, unspecified: Secondary | ICD-10-CM | POA: Diagnosis present

## 2012-01-18 DIAGNOSIS — E785 Hyperlipidemia, unspecified: Secondary | ICD-10-CM | POA: Diagnosis present

## 2012-01-18 DIAGNOSIS — Z7902 Long term (current) use of antithrombotics/antiplatelets: Secondary | ICD-10-CM | POA: Diagnosis not present

## 2012-01-18 DIAGNOSIS — I999 Unspecified disorder of circulatory system: Secondary | ICD-10-CM | POA: Diagnosis not present

## 2012-01-18 DIAGNOSIS — I743 Embolism and thrombosis of arteries of the lower extremities: Secondary | ICD-10-CM | POA: Diagnosis not present

## 2012-01-18 DIAGNOSIS — Z7901 Long term (current) use of anticoagulants: Secondary | ICD-10-CM

## 2012-01-18 DIAGNOSIS — M79609 Pain in unspecified limb: Secondary | ICD-10-CM | POA: Diagnosis not present

## 2012-01-18 DIAGNOSIS — J9819 Other pulmonary collapse: Secondary | ICD-10-CM | POA: Diagnosis not present

## 2012-01-18 HISTORY — PX: AORTA - BILATERAL FEMORAL ARTERY BYPASS GRAFT: SHX1175

## 2012-01-18 LAB — TYPE AND SCREEN: Antibody Screen: NEGATIVE

## 2012-01-18 LAB — POCT I-STAT 7, (LYTES, BLD GAS, ICA,H+H)
HCT: 33 % — ABNORMAL LOW (ref 39.0–52.0)
Hemoglobin: 11.2 g/dL — ABNORMAL LOW (ref 13.0–17.0)
Patient temperature: 33.9
Sodium: 138 mEq/L (ref 135–145)
pH, Arterial: 7.385 (ref 7.350–7.450)
pO2, Arterial: 359 mmHg — ABNORMAL HIGH (ref 80.0–100.0)

## 2012-01-18 LAB — BASIC METABOLIC PANEL
BUN: 12 mg/dL (ref 6–23)
Calcium: 7.9 mg/dL — ABNORMAL LOW (ref 8.4–10.5)
Creatinine, Ser: 0.63 mg/dL (ref 0.50–1.35)
GFR calc non Af Amer: >90 mL/min (ref >90)
Glucose, Bld: 297 mg/dL — ABNORMAL HIGH (ref 70–99)
Potassium: 4.5 mEq/L (ref 3.5–5.1)

## 2012-01-18 LAB — BLOOD GAS, ARTERIAL
Bicarbonate: 24.3 mEq/L — ABNORMAL HIGH (ref 20.0–24.0)
O2 Saturation: 92.5 %
TCO2: 26 mmol/L (ref 0–100)
pO2, Arterial: 77.3 mmHg — ABNORMAL LOW (ref 80.0–100.0)

## 2012-01-18 LAB — CBC
HCT: 35.4 % — ABNORMAL LOW (ref 39.0–52.0)
Hemoglobin: 12 g/dL — ABNORMAL LOW (ref 13.0–17.0)
MCH: 31.4 pg (ref 26.0–34.0)
MCHC: 33.9 g/dL (ref 30.0–36.0)
MCV: 92.7 fL (ref 78.0–100.0)
RDW: 13.6 % (ref 11.5–15.5)

## 2012-01-18 LAB — GLUCOSE, CAPILLARY
Glucose-Capillary: 154 mg/dL — ABNORMAL HIGH (ref 70–99)
Glucose-Capillary: 253 mg/dL — ABNORMAL HIGH (ref 70–99)
Glucose-Capillary: 319 mg/dL — ABNORMAL HIGH (ref 70–99)
Glucose-Capillary: 266 mg/dL — ABNORMAL HIGH (ref 70–99)

## 2012-01-18 LAB — PROTIME-INR: INR: 1.17 (ref 0.00–1.49)

## 2012-01-18 LAB — MAGNESIUM: Magnesium: 1.6 mg/dL (ref 1.5–2.5)

## 2012-01-18 SURGERY — CREATION, BYPASS, ARTERIAL, AORTA TO FEMORAL, BILATERAL, USING GRAFT
Anesthesia: General | Site: Abdomen | Wound class: Clean

## 2012-01-18 MED ORDER — METOPROLOL TARTRATE 1 MG/ML IV SOLN
2.0000 mg | INTRAVENOUS | Status: DC | PRN
  Filled 2012-01-18: qty 5

## 2012-01-18 MED ORDER — MIDAZOLAM HCL 5 MG/5ML IJ SOLN
INTRAMUSCULAR | Status: DC | PRN
  Administered 2012-01-18 (×2): 2 mg via INTRAVENOUS

## 2012-01-18 MED ORDER — POTASSIUM CHLORIDE IN NACL 20-0.9 MEQ/L-% IV SOLN
INTRAVENOUS | Status: DC
  Administered 2012-01-18 – 2012-01-20 (×5): via INTRAVENOUS
  Filled 2012-01-18 (×8): qty 1000

## 2012-01-18 MED ORDER — NALOXONE HCL 0.4 MG/ML IJ SOLN: 0.4000 mg | INTRAMUSCULAR | Status: DC | PRN

## 2012-01-18 MED ORDER — SODIUM CHLORIDE 0.45 % IV SOLN
INTRAVENOUS | Status: DC
  Administered 2012-01-18: 20:00:00 via INTRAVENOUS

## 2012-01-18 MED ORDER — DIPHENHYDRAMINE HCL 12.5 MG/5ML PO ELIX
12.5000 mg | ORAL_SOLUTION | Freq: Four times a day (QID) | ORAL | Status: DC | PRN
  Filled 2012-01-18: qty 5

## 2012-01-18 MED ORDER — PROTAMINE SULFATE 10 MG/ML IV SOLN
INTRAVENOUS | Status: DC | PRN
  Administered 2012-01-18: 50 mg via INTRAVENOUS

## 2012-01-18 MED ORDER — DEXTROSE 5 % IV SOLN
1.5000 g | Freq: Two times a day (BID) | INTRAVENOUS | Status: AC
  Administered 2012-01-18 – 2012-01-19 (×2): 1.5 g via INTRAVENOUS
  Filled 2012-01-18 (×2): qty 1.5

## 2012-01-18 MED ORDER — HYDROMORPHONE HCL PF 1 MG/ML IJ SOLN: 0.2500 mg | INTRAMUSCULAR | Status: DC | PRN

## 2012-01-18 MED ORDER — ROCURONIUM BROMIDE 100 MG/10ML IV SOLN
INTRAVENOUS | Status: DC | PRN
  Administered 2012-01-18: 20 mg via INTRAVENOUS
  Administered 2012-01-18: 10 mg via INTRAVENOUS
  Administered 2012-01-18: 20 mg via INTRAVENOUS
  Administered 2012-01-18 (×2): 10 mg via INTRAVENOUS
  Administered 2012-01-18: 20 mg via INTRAVENOUS
  Administered 2012-01-18: 10 mg via INTRAVENOUS
  Administered 2012-01-18: 50 mg via INTRAVENOUS
  Administered 2012-01-18: 20 mg via INTRAVENOUS

## 2012-01-18 MED ORDER — PHENOL 1.4 % MT LIQD: 1.0000 | OROMUCOSAL | Status: DC | PRN

## 2012-01-18 MED ORDER — MANNITOL 25 % IV SOLN
INTRAVENOUS | Status: DC | PRN
  Administered 2012-01-18: 25 g via INTRAVENOUS

## 2012-01-18 MED ORDER — HYDRALAZINE HCL 20 MG/ML IJ SOLN
10.0000 mg | INTRAMUSCULAR | Status: DC | PRN
  Filled 2012-01-18: qty 0.5

## 2012-01-18 MED ORDER — BIOTENE DRY MOUTH MT LIQD
15.0000 mL | Freq: Two times a day (BID) | OROMUCOSAL | Status: DC
  Administered 2012-01-18 – 2012-01-21 (×7): 15 mL via OROMUCOSAL

## 2012-01-18 MED ORDER — FAMOTIDINE IN NACL 20-0.9 MG/50ML-% IV SOLN
20.0000 mg | Freq: Two times a day (BID) | INTRAVENOUS | Status: DC
  Administered 2012-01-18 – 2012-01-22 (×8): 20 mg via INTRAVENOUS
  Filled 2012-01-18 (×9): qty 50

## 2012-01-18 MED ORDER — PROPOFOL 10 MG/ML IV EMUL
INTRAVENOUS | Status: DC | PRN
  Administered 2012-01-18: 200 mg via INTRAVENOUS

## 2012-01-18 MED ORDER — ACETAMINOPHEN 650 MG RE SUPP: 325.0000 mg | RECTAL | Status: DC | PRN

## 2012-01-18 MED ORDER — MAGNESIUM SULFATE 40 MG/ML IJ SOLN
2.0000 g | Freq: Once | INTRAMUSCULAR | Status: AC
  Administered 2012-01-18: 2 g via INTRAVENOUS
  Filled 2012-01-18: qty 50

## 2012-01-18 MED ORDER — SODIUM CHLORIDE 0.9 % IR SOLN
Status: DC | PRN
  Administered 2012-01-18: 09:00:00

## 2012-01-18 MED ORDER — GLYCOPYRROLATE 0.2 MG/ML IJ SOLN
INTRAMUSCULAR | Status: DC | PRN
  Administered 2012-01-18: .6 mg via INTRAVENOUS

## 2012-01-18 MED ORDER — ONDANSETRON HCL 4 MG/2ML IJ SOLN: 4.0000 mg | Freq: Four times a day (QID) | INTRAMUSCULAR | Status: DC | PRN

## 2012-01-18 MED ORDER — SODIUM CHLORIDE 0.9 % IV SOLN: 500.0000 mL | Freq: Once | INTRAVENOUS | Status: AC | PRN

## 2012-01-18 MED ORDER — ENOXAPARIN SODIUM 120 MG/0.8ML ~~LOC~~ SOLN
120.0000 mg | SUBCUTANEOUS | Status: DC
  Administered 2012-01-19 – 2012-01-22 (×4): 120 mg via SUBCUTANEOUS
  Filled 2012-01-18 (×4): qty 0.8

## 2012-01-18 MED ORDER — CEFUROXIME SODIUM 1.5 G IJ SOLR: 1.5000 g | INTRAMUSCULAR | Status: DC

## 2012-01-18 MED ORDER — ACETAMINOPHEN 325 MG PO TABS
325.0000 mg | ORAL_TABLET | ORAL | Status: DC | PRN
  Administered 2012-01-21: 650 mg via ORAL
  Filled 2012-01-18: qty 2

## 2012-01-18 MED ORDER — HETASTARCH-ELECTROLYTES 6 % IV SOLN
INTRAVENOUS | Status: DC | PRN
  Administered 2012-01-18 (×2): via INTRAVENOUS

## 2012-01-18 MED ORDER — CHLORHEXIDINE GLUCONATE 0.12 % MT SOLN
15.0000 mL | Freq: Two times a day (BID) | OROMUCOSAL | Status: DC
  Administered 2012-01-18 – 2012-01-22 (×8): 15 mL via OROMUCOSAL
  Filled 2012-01-18 (×10): qty 15

## 2012-01-18 MED ORDER — HEPARIN SODIUM (PORCINE) 1000 UNIT/ML IJ SOLN
INTRAMUSCULAR | Status: DC | PRN
  Administered 2012-01-18: 2000 [IU] via INTRAVENOUS
  Administered 2012-01-18: 8000 [IU] via INTRAVENOUS

## 2012-01-18 MED ORDER — INSULIN ASPART 100 UNIT/ML ~~LOC~~ SOLN
0.0000 [IU] | SUBCUTANEOUS | Status: DC
  Administered 2012-01-18: 8 [IU] via SUBCUTANEOUS
  Administered 2012-01-18: 11 [IU] via SUBCUTANEOUS
  Administered 2012-01-19 (×2): 3 [IU] via SUBCUTANEOUS
  Administered 2012-01-19 (×2): 2 [IU] via SUBCUTANEOUS
  Administered 2012-01-19: 3 [IU] via SUBCUTANEOUS
  Administered 2012-01-19: 2 [IU] via SUBCUTANEOUS
  Administered 2012-01-20 (×3): 3 [IU] via SUBCUTANEOUS
  Administered 2012-01-20 (×2): 5 [IU] via SUBCUTANEOUS
  Administered 2012-01-20 – 2012-01-21 (×2): 3 [IU] via SUBCUTANEOUS
  Administered 2012-01-21: 5 [IU] via SUBCUTANEOUS
  Administered 2012-01-21: 2 [IU] via SUBCUTANEOUS
  Administered 2012-01-21: 5 [IU] via SUBCUTANEOUS
  Administered 2012-01-21: 2 [IU] via SUBCUTANEOUS
  Administered 2012-01-21 – 2012-01-22 (×3): 3 [IU] via SUBCUTANEOUS
  Administered 2012-01-22: 8 [IU] via SUBCUTANEOUS
  Filled 2012-01-18 (×2): qty 3

## 2012-01-18 MED ORDER — 0.9 % SODIUM CHLORIDE (POUR BTL) OPTIME
TOPICAL | Status: DC | PRN
  Administered 2012-01-18: 1000 mL

## 2012-01-18 MED ORDER — LACTATED RINGERS IV SOLN
INTRAVENOUS | Status: DC | PRN
  Administered 2012-01-18 (×2): via INTRAVENOUS

## 2012-01-18 MED ORDER — DOPAMINE-DEXTROSE 3.2-5 MG/ML-% IV SOLN: 3.0000 ug/kg/min | INTRAVENOUS | Status: DC

## 2012-01-18 MED ORDER — POTASSIUM CHLORIDE CRYS ER 20 MEQ PO TBCR: 20.0000 meq | EXTENDED_RELEASE_TABLET | Freq: Once | ORAL | Status: AC | PRN

## 2012-01-18 MED ORDER — DEXTROSE 5 % IV SOLN
1.5000 g | INTRAVENOUS | Status: DC
  Filled 2012-01-18: qty 1.5

## 2012-01-18 MED ORDER — DROPERIDOL 2.5 MG/ML IJ SOLN
0.6250 mg | INTRAMUSCULAR | Status: DC | PRN
  Filled 2012-01-18: qty 0.25

## 2012-01-18 MED ORDER — ONDANSETRON HCL 4 MG/2ML IJ SOLN
INTRAMUSCULAR | Status: DC | PRN
  Administered 2012-01-18: 4 mg via INTRAVENOUS

## 2012-01-18 MED ORDER — METOPROLOL SUCCINATE ER 50 MG PO TB24
50.0000 mg | ORAL_TABLET | ORAL | Status: AC
  Administered 2012-01-18: 50 mg via ORAL
  Filled 2012-01-18: qty 1

## 2012-01-18 MED ORDER — LABETALOL HCL 5 MG/ML IV SOLN
10.0000 mg | INTRAVENOUS | Status: DC | PRN
  Administered 2012-01-18: 5 mg via INTRAVENOUS
  Filled 2012-01-18 (×2): qty 4

## 2012-01-18 MED ORDER — METOPROLOL TARTRATE 1 MG/ML IV SOLN
5.0000 mg | Freq: Four times a day (QID) | INTRAVENOUS | Status: DC
  Administered 2012-01-18 – 2012-01-22 (×15): 5 mg via INTRAVENOUS
  Filled 2012-01-18 (×19): qty 5

## 2012-01-18 MED ORDER — LACTATED RINGERS IV SOLN
INTRAVENOUS | Status: DC | PRN
  Administered 2012-01-18 (×2): via INTRAVENOUS

## 2012-01-18 MED ORDER — LABETALOL HCL 5 MG/ML IV SOLN
INTRAVENOUS | Status: DC | PRN
  Administered 2012-01-18 (×2): 5 mg via INTRAVENOUS

## 2012-01-18 MED ORDER — DEXTROSE 5 % IV SOLN
1.5000 g | INTRAVENOUS | Status: DC | PRN
  Administered 2012-01-18: 1.5 g via INTRAVENOUS

## 2012-01-18 MED ORDER — FENTANYL CITRATE 0.05 MG/ML IJ SOLN
INTRAMUSCULAR | Status: DC | PRN
  Administered 2012-01-18 (×2): 100 ug via INTRAVENOUS
  Administered 2012-01-18: 250 ug via INTRAVENOUS
  Administered 2012-01-18 (×2): 50 ug via INTRAVENOUS
  Administered 2012-01-18: 100 ug via INTRAVENOUS
  Administered 2012-01-18 (×2): 50 ug via INTRAVENOUS
  Administered 2012-01-18: 250 ug via INTRAVENOUS
  Administered 2012-01-18 (×2): 100 ug via INTRAVENOUS

## 2012-01-18 MED ORDER — GUAIFENESIN-DM 100-10 MG/5ML PO SYRP: 15.0000 mL | ORAL_SOLUTION | ORAL | Status: DC | PRN

## 2012-01-18 MED ORDER — MORPHINE SULFATE (PF) 1 MG/ML IV SOLN
INTRAVENOUS | Status: DC
  Administered 2012-01-18: 19:00:00 via INTRAVENOUS
  Administered 2012-01-18: 3 mg via INTRAVENOUS
  Administered 2012-01-18: 7.5 mg via INTRAVENOUS
  Administered 2012-01-19: 1.5 mg via INTRAVENOUS
  Administered 2012-01-19: 3 mg via INTRAVENOUS
  Administered 2012-01-19: 1.5 mg via INTRAVENOUS
  Administered 2012-01-19: 08:00:00 via INTRAVENOUS
  Administered 2012-01-19: 10.5 mg via INTRAVENOUS
  Administered 2012-01-19: 1.5 mg via INTRAVENOUS
  Administered 2012-01-20: 4.5 mg via INTRAVENOUS
  Administered 2012-01-20: 7.5 mg via INTRAVENOUS
  Administered 2012-01-20: 6 mg via INTRAVENOUS
  Administered 2012-01-20: 4.5 mg via INTRAVENOUS
  Administered 2012-01-20: via INTRAVENOUS
  Filled 2012-01-18 (×4): qty 25

## 2012-01-18 MED ORDER — DIPHENHYDRAMINE HCL 50 MG/ML IJ SOLN: 12.5000 mg | Freq: Four times a day (QID) | INTRAMUSCULAR | Status: DC | PRN

## 2012-01-18 MED ORDER — SODIUM CHLORIDE 0.9 % IJ SOLN
9.0000 mL | INTRAMUSCULAR | Status: DC | PRN
  Administered 2012-01-19: 9 mL via INTRAVENOUS

## 2012-01-18 MED ORDER — ALBUMIN HUMAN 5 % IV SOLN
INTRAVENOUS | Status: DC | PRN
  Administered 2012-01-18 (×2): via INTRAVENOUS

## 2012-01-18 MED ORDER — NEOSTIGMINE METHYLSULFATE 1 MG/ML IJ SOLN
INTRAMUSCULAR | Status: DC | PRN
  Administered 2012-01-18: 4 mg via INTRAVENOUS

## 2012-01-18 SURGICAL SUPPLY — 79 items
APL SKNCLS STERI-STRIP NONHPOA (GAUZE/BANDAGES/DRESSINGS) ×3
BAG ISL DRAPE 18X18 STRL (DRAPES) ×2
BAG ISOLATION DRAPE 18X18 (DRAPES) IMPLANT
BENZOIN TINCTURE PRP APPL 2/3 (GAUZE/BANDAGES/DRESSINGS) ×3 IMPLANT
BRUSH SCRUB EZ PLAIN DRY (MISCELLANEOUS) ×1 IMPLANT
CANISTER SUCTION 2500CC (MISCELLANEOUS) ×2 IMPLANT
CANNULA VESSEL W/WING WO/VALVE (CANNULA) ×2 IMPLANT
CATH EMB 3FR 40CM (CATHETERS) ×1 IMPLANT
CATH EMB 4FR 80CM (CATHETERS) ×1 IMPLANT
CLIP LIGATING EXTRA MED SLVR (CLIP) ×3 IMPLANT
CLIP LIGATING EXTRA SM BLUE (MISCELLANEOUS) ×2 IMPLANT
CLOTH BEACON ORANGE TIMEOUT ST (SAFETY) ×2 IMPLANT
COVER SURGICAL LIGHT HANDLE (MISCELLANEOUS) ×4 IMPLANT
COVER TABLE BACK 60X90 (DRAPES) ×2 IMPLANT
DRAPE BILATERAL SPLIT (DRAPES) ×1 IMPLANT
DRAPE CV SPLIT W-CLR ANES SCRN (DRAPES) ×1 IMPLANT
DRAPE INCISE IOBAN 66X45 STRL (DRAPES) ×1 IMPLANT
DRAPE ISOLATION BAG 18X18 (DRAPES) ×2
DRAPE WARM FLUID 44X44 (DRAPE) ×2 IMPLANT
DRSG COVADERM 4X14 (GAUZE/BANDAGES/DRESSINGS) ×1 IMPLANT
DRSG COVADERM 4X6 (GAUZE/BANDAGES/DRESSINGS) ×1 IMPLANT
ELECT BLADE 4.0 EZ CLEAN MEGAD (MISCELLANEOUS)
ELECT REM PT RETURN 9FT ADLT (ELECTROSURGICAL) ×2
ELECTRODE BLDE 4.0 EZ CLN MEGD (MISCELLANEOUS) IMPLANT
ELECTRODE REM PT RTRN 9FT ADLT (ELECTROSURGICAL) ×1 IMPLANT
GAUZE SPONGE 4X4 12PLY STRL LF (GAUZE/BANDAGES/DRESSINGS) ×2 IMPLANT
GLOVE BIOGEL PI IND STRL 6.5 (GLOVE) IMPLANT
GLOVE BIOGEL PI IND STRL 7.0 (GLOVE) IMPLANT
GLOVE BIOGEL PI IND STRL 7.5 (GLOVE) IMPLANT
GLOVE BIOGEL PI INDICATOR 6.5 (GLOVE) ×4
GLOVE BIOGEL PI INDICATOR 7.0 (GLOVE) ×1
GLOVE BIOGEL PI INDICATOR 7.5 (GLOVE) ×2
GLOVE ECLIPSE 6.5 STRL STRAW (GLOVE) ×4 IMPLANT
GLOVE SS BIOGEL STRL SZ 7 (GLOVE) IMPLANT
GLOVE SS BIOGEL STRL SZ 7.5 (GLOVE) ×1 IMPLANT
GLOVE SUPERSENSE BIOGEL SZ 7 (GLOVE) ×1
GLOVE SUPERSENSE BIOGEL SZ 7.5 (GLOVE) ×1
GLOVE SURG SS PI 6.0 STRL IVOR (GLOVE) ×1 IMPLANT
GLOVE SURG SS PI 7.5 STRL IVOR (GLOVE) ×1 IMPLANT
GOWN PREVENTION PLUS XLARGE (GOWN DISPOSABLE) ×1 IMPLANT
GOWN STRL NON-REIN LRG LVL3 (GOWN DISPOSABLE) ×11 IMPLANT
GRAFT HEMASHIELD 14X8MM (Vascular Products) ×1 IMPLANT
INSERT FOGARTY 61MM (MISCELLANEOUS) ×1 IMPLANT
INSERT FOGARTY SM (MISCELLANEOUS) ×2 IMPLANT
KIT BASIN OR (CUSTOM PROCEDURE TRAY) ×2 IMPLANT
KIT ROOM TURNOVER OR (KITS) ×2 IMPLANT
NS IRRIG 1000ML POUR BTL (IV SOLUTION) ×6 IMPLANT
PACK AORTA (CUSTOM PROCEDURE TRAY) ×1 IMPLANT
PACK PERIPHERAL VASCULAR (CUSTOM PROCEDURE TRAY) ×1 IMPLANT
PAD ARMBOARD 7.5X6 YLW CONV (MISCELLANEOUS) ×4 IMPLANT
SPECIMEN JAR MEDIUM (MISCELLANEOUS) ×2 IMPLANT
SPONGE GAUZE 4X4 12PLY (GAUZE/BANDAGES/DRESSINGS) ×1 IMPLANT
SPONGE LAP 18X18 X RAY DECT (DISPOSABLE) ×3 IMPLANT
STAPLER VISISTAT 35W (STAPLE) ×4 IMPLANT
STOPCOCK 4 WAY LG BORE MALE ST (IV SETS) ×1 IMPLANT
STRIP CLOSURE SKIN 1/2X4 (GAUZE/BANDAGES/DRESSINGS) ×3 IMPLANT
SUT ETHIBOND 5 LR DA (SUTURE) IMPLANT
SUT PDS AB 1 TP1 54 (SUTURE) ×4 IMPLANT
SUT PROLENE 3 0 SH1 36 (SUTURE) ×3 IMPLANT
SUT PROLENE 5 0 C 1 24 (SUTURE) ×9 IMPLANT
SUT PROLENE 5 0 C 1 36 (SUTURE) IMPLANT
SUT PROLENE 6 0 CC (SUTURE) ×3 IMPLANT
SUT SILK 2 0 SH CR/8 (SUTURE) ×2 IMPLANT
SUT SILK 2 0 TIES 17X18 (SUTURE) ×2
SUT SILK 2-0 18XBRD TIE BLK (SUTURE) ×1 IMPLANT
SUT SILK 3 0 TIES 17X18 (SUTURE) ×2
SUT SILK 3-0 18XBRD TIE BLK (SUTURE) ×1 IMPLANT
SUT VIC AB 2-0 CT1 36 (SUTURE) ×4 IMPLANT
SUT VIC AB 3-0 SH 27 (SUTURE) ×6
SUT VIC AB 3-0 SH 27X BRD (SUTURE) ×2 IMPLANT
SUT VICRYL 4-0 PS2 18IN ABS (SUTURE) ×1 IMPLANT
SYR 3ML LL SCALE MARK (SYRINGE) ×1 IMPLANT
TAPE CLOTH SURG 4X10 WHT LF (GAUZE/BANDAGES/DRESSINGS) ×2 IMPLANT
TOWEL BLUE STERILE X RAY DET (MISCELLANEOUS) ×4 IMPLANT
TOWEL OR 17X24 6PK STRL BLUE (TOWEL DISPOSABLE) ×4 IMPLANT
TOWEL OR 17X26 10 PK STRL BLUE (TOWEL DISPOSABLE) ×4 IMPLANT
TRAY FOLEY CATH 14FRSI W/METER (CATHETERS) ×2 IMPLANT
WATER STERILE IRR 1000ML POUR (IV SOLUTION) ×4 IMPLANT
YANKAUER SUCT BULB TIP NO VENT (SUCTIONS) ×1 IMPLANT

## 2012-01-18 NOTE — Anesthesia Preprocedure Evaluation (Addendum)
Anesthesia Evaluation  Patient identified by MRN, date of birth, ID band Patient awake    Reviewed: Allergy & Precautions, H&P , NPO status , Patient's Chart, lab work & pertinent test results, reviewed documented beta blocker date and time   Airway Mallampati: II TM Distance: <3 FB Neck ROM: Full    Dental  (+) Dental Advisory Given and Teeth Intact   Pulmonary  clear to auscultation  Pulmonary exam normal       Cardiovascular hypertension, + CAD Regular Normal+ Systolic Click- Systolic murmurs    Neuro/Psych    GI/Hepatic   Endo/Other  Diabetes mellitus-  Renal/GU      Musculoskeletal   Abdominal   Peds  Hematology   Anesthesia Other Findings   Reproductive/Obstetrics                         Anesthesia Physical Anesthesia Plan  ASA: III  Anesthesia Plan: General   Post-op Pain Management:    Induction: Intravenous  Airway Management Planned: Oral ETT  Additional Equipment: Arterial line, PA Cath, CVP and Ultrasound Guidance Line Placement  Intra-op Plan:   Post-operative Plan: Extubation in OR  Informed Consent: I have reviewed the patients History and Physical, chart, labs and discussed the procedure including the risks, benefits and alternatives for the proposed anesthesia with the patient or authorized representative who has indicated his/her understanding and acceptance.   Dental advisory given  Plan Discussed with: CRNA, Anesthesiologist and Surgeon  Anesthesia Plan Comments:        Anesthesia Quick Evaluation

## 2012-01-18 NOTE — Transfer of Care (Signed)
Immediate Anesthesia Transfer of Care Note  Patient: [name] [name]  Procedure(s) Performed: Procedure(s) (LRB): AORTA BIFEMORAL BYPASS GRAFT (N/A)  Patient Location: PACU  Anesthesia Type: General  Level of Consciousness: awake, alert , oriented and patient cooperative  Airway & Oxygen Therapy: Patient Spontanous Breathing and Patient connected to face mask oxygen  Post-op Assessment: Report given to PACU RN, Post -op Vital signs reviewed and stable and Patient moving all extremities  Post vital signs: Reviewed and stable  Complications: No apparent anesthesia complications

## 2012-01-18 NOTE — Interval H&P Note (Signed)
History and Physical Interval Note:  01/18/2012 7:19 AM  [name] [name]  has presented today for surgery, with the diagnosis of Left Leg Ischemia; Peripheral Vascular Disease  The various methods of treatment have been discussed with the patient and family. After consideration of risks, benefits and other options for treatment, the patient has consented to  Procedure(s): AORTA BIFEMORAL BYPASS GRAFT as a surgical intervention .  The patients' history has been reviewed, patient examined, no change in status, stable for surgery.  I have reviewed the patients' chart and labs.  Questions were answered to the patient's satisfaction.    The patient films from 01/07/12 from [facility] were reviewed and discussed with the patient. This shows progression to occlusion of his entire left common and external iliac artery.  For aorta bifemoral bypass. EARLY, TODD

## 2012-01-18 NOTE — Progress Notes (Signed)
ANTIBIOTIC/ANTICOAGULATION CONSULT NOTE - INITIAL  Pharmacy Consult for adjust antibiotics for renal function/ MD dosing lovenox Indication: s/p fem-pop bypass graft (Lovenox for [employer] Jude mechanical AVR)  No Known Allergies  Patient Measurements: Height: 5\' 8"  (172.7 cm) Weight: 178 lb 2.1 oz (80.8 kg) IBW/kg (Calculated) : 68.4  Adjusted Body Weight: 81 kg  Labs:  Basename 01/18/12 1530 01/18/12 1514  WBC 9.7 --  HGB 12.0* 11.2*  PLT 162 --  LABCREA -- --  CREATININE 0.63 --   Estimated Creatinine Clearance: 87.9 ml/min (by C-G formula based on Cr of 0.63).   Microbiology: Recent Results (from the past 720 hour(s))  SURGICAL PCR SCREEN     Status: Normal   Collection Time   01/15/12  2:15 PM      Component Value Range Status Comment   MRSA, PCR NEGATIVE  NEGATIVE  Final    Staphylococcus aureus NEGATIVE  NEGATIVE  Final     Medical History: Past Medical History  Diagnosis Date  . Diabetes mellitus   . Hypertension   . Hyperlipidemia   . ITP (idiopathic thrombocytopenic purpura)   . Coronary artery disease     s/p cabg x 3 11/2003: lima-lad, seq vg to rpda and rpl  . Aortic stenosis     s/p st. jude mechanical AVR - Chronic Coumadin  . Claudication   . Diverticulitis of colon   . Hx of colonic polyps   . Peripheral vascular disease     s/p Left external Iliac Artery stenting and subsequent left femoral endarterectomy 02/2011- post op course complicated by wound infxn req I&D 03/2011  . Bronchitis 12/17/11    "just gettin over a case"  . Anemia   . Blood transfusion     Medications:  Scheduled:    . cefUROXime (ZINACEF)  IV  1.5 g Intravenous Q12H  . enoxaparin  120 mg Subcutaneous Q24H  . famotidine (PEPCID) IV  20 mg Intravenous Q12H  . insulin aspart  0-15 Units Subcutaneous Q4H  . magnesium sulfate 1 - 4 g bolus IVPB  2 g Intravenous Once  . metoprolol  5 mg Intravenous Q6H  . metoprolol succinate  50 mg Oral To 282  . morphine   Intravenous Q4H  .  DISCONTD: cefUROXime (ZINACEF)  IV  1.5 g Intravenous 60 min Pre-Op  . DISCONTD: cefUROXime (ZINACEF)  IV  1.5 g Intravenous To 282  . DISCONTD: cefUROXime  1.5 g Intramuscular 60 min Pre-Op   Assessment: 66 yo male s/p aortobifemoral bypass on post-op cefuroxime x 2 doses.  Doses okay for post-op prophylaxis.  Also on Lovenox 1.5 mg/kg q24 hrs while Coumadin on hold.  Dose appropriate for weight and renal function.  Goal of Therapy:  n/a  Plan:  1. Continue antibiotics and Lovenox at current dosing. 2. Watch CBC while on Lovenox. 3. Pharmacy will monitor peripherally, please contact if needed.  Gardner Candle 01/18/2012,6:02 PM

## 2012-01-18 NOTE — Op Note (Signed)
OPERATIVE REPORT  DATE OF SURGERY: 01/18/2012  PATIENT: [name] [name], 66 y.o. male MRN: [id number]  DOB: 1946-08-10  PRE-OPERATIVE DIAGNOSIS: Aortoiliac occlusive disease, critical limb ischemia left foot  POST-OPERATIVE DIAGNOSIS:  Same  PROCEDURE: Aortobifemoral bypass with a 14 x 8 Hemashield graft, bilateral femoral endarterectomy  SURGEON:  Gretta Began, M.D.  PHYSICIAN ASSISTANT: Ell[place] and Roczniak  ANESTHESIA:  Gen.  EBL: 450 ml  Total I/O In: 6155 [I.V.:4200; Blood:455; IV Piggyback:1500] Out: 1615 [Urine:465; Blood:1150]  BLOOD ADMINISTERED: Cell saver  DRAINS: None  SPECIMEN: None  COUNTS CORRECT:  YES  PLAN OF CARE: PACU extubated in stable   PATIENT DISPOSITION:  PACU - hemodynamically stable  PROCEDURE DETAILS: The patient has extensive prior revascularizations for lower extremity arterial insufficiency. He undergone bilateral iliac angioplasty and stenting in the past there were several years and has undergone prior left femoral endarterectomy and patch. He had postoperative complications that time with wound problems with several operative debridements and eventual closure with a VAC drain. He presented recently with the recurrent ischemia and arteriogram on 12/16/2011 revealed eccentric plaque in the distal external iliac artery on the left and a high-grade stenosis at the proximal superficial femoral artery. Essentially underwent another arteriogram and an outlying facility on January 31 and this showed complete occlusion of his left common and external iliac and also common femoral artery. The patient has had progressive ischemia paresthesias and rest pain. He is to bring this time for aortobifemoral bypass. The procedure including potential complications with groin wound problems and graft infection were discussed with the patient. He does have limb threatening ischemia and therefore will proceed with surgery.  The patient was taken to the operating room  and placed supine position where the area of the abdomen groin and both legs were prepped in usual sterile fashion the left groin incision was reopened through the old scar. There was extensive dense scar throughout the left groin. Sharp dissection was continued down to the prior graft and the common superficial femoral fundus femoris arteries were exposed. The profundus femoris artery was not encircled circumferentially 22 dense scar and adherence to the vein. The distal superficial femoral artery was encircled with a vessel loop. Dissection extended up under the inguinal ligament. The common femoral artery was had a prior patch angioplasty and this was occluded. Superficial femoral artery was exposed below the level of the prior endarterectomy and patch. Next attention was turned to the right groin. The suprainguinal incision was made carried down to isolate the common superficial femoral and profundus wrist arteries. The patient had extensive calcification of his vessels. The common femoral artery was encircled at the level of the inguinal ligament., Were created from the level of the groin to the external iliac as far as could be reached. A crossing vein in the right groin was ligated with 2-0 silk ties and divided. Next incision was made from below the xiphoid to below the umbilicus and carried down through the subcutaneous fat with electrocautery. The midline fascia was opened in line with the skin incision. The peritoneum was entered. A small large and large bowel were normal. The liver gallbladder and stomach were normal. NG tube was positioned in the stomach. The Omni-Tract retractor was used for exposure. The transverse colon omentum reflected superiorly and the small bowel retracted to the right. The duodenum was mobilized off the aorta. The aorta was extremely calcified. It was somewhat softer just below the renal arteries and was circumferentially calcified below this. The inferior mesenteric  artery was  of good caliber and was preserved. Tunnels were created over the iliac vessels taking care to pass the tunnels below the level of the ureters bilaterally. Umbilical tapes were passed from the aortic bifurcation to the respective groins for eventual graft tunneling. The patient was given 25 g of mannitol and 8000 units of intravenous heparin. After adequate circulation time the aorta was occluded just below the level of the renal arteries with a Harken clamp the distal aorta was occluded with a DeBakey clamp. A 2 cubic centimeters were required due to the extensive calcification at this level. The aorta was transected below the level of the proximal clamp. The distal aorta had to be endarterectomized for closure and the distal aorta the stump was oversewn with 3-0 Prolene sutures with felt pledgets. Distal clamps were removed. Next the proximal aorta was endarterectomized to allow sewing the graft. A 14 x 8 Hemashield graft up onto the field was cut to appropriate length and was sewn end to end to the aorta with a running 3-0 Prolene suture. Again a felt strip was used for reinforcement. Clamps were removed to flush the graft and the graft was reoccluded. Anastomosis of the adequate. The right and left limbs of the graft were then brought to the respective groins. The right common superficial femoral function was arteries were occluded. Again the common femoral artery was extensively calcified. The anterior surface was opened with an 11 blade and cell through the plaque onto the superficial femoral artery. Endarterectomy was required for anastomosis. There was a good tapering into the profundus wrist artery. The superficial femoral artery and common femoral artery plaque was tacked proximally and packed proximally and distally. This was with 6-0 Prolene sutures. The right limb of the graft cut to appropriate length and was sewn end-to-side to the artery with a running 5-0 Prolene suture. The graft was spatulated.  Prior to completion of the anastomosis the usual flushing maneuvers were taken as was completed. Good Doppler flow was noted in the supersharp superficial femoral and function was arteries.  Attention was next turned to the left groin. He superficial artery was occluded with a vascular clamp the profunda femoris artery was not able to be isolated the 2 dense scarring. The proximal artery was chronically occluded with thrombus. The common femoral artery was opened through the prior patch. This was debrided back to the inguinal ligament and this was oversewn with 2 layers of 5-0 Prolene suture. The artery was opened down onto the superficial artery past the prior patch. There was extensive intimal hyperplasia this was debrided and re\re endarterectomized down to normal superficial femoral artery. Maisie Fus was removed from the profundus femoris artery proximally and excellent backbleeding was encountered. This was controlled with a 4 Fogarty with stopcock for hemostasis. The intimal hyperplasia was and there was a endarterectomized down into the profunda femoris the good resulting lumen. Decision was made to sew the graft with the toe of the graft when onto the superficial femoral artery and he'll be the profunda. This was done with a running 5-0 Prolene suture along the resulting anastomosis. Prior to completion of this anastomosis the usual flushing and taken and the anastomosis completed. Clamps removed from the graft restoring flow to the left leg. Excellent Doppler flow was noted at the foot and the posterior tibial and anterior tibial bilaterally. Patient was given 50 mg of protamine to reverse heparin. The abdominal wound again was asked closed with electrocautery. The retroperitoneum was closed over the graft  to prevent bowel from being in contact with the graft through this with a running 2-0 Vicryl suture. The small bowel was then run satiety found to be without injury was placed back in the pelvis. The  transverse colon omentum were placed over this. The midline fascia #1 PDS suture beginning proximally and distally and tying in the middle the midline fascia. The wounds skin was closed with 3-0 Vicryl suture. The groin wounds were likewise irrigated with saline hemostasis with electrocautery. The groins were closed with 2 layers of 2-0 Vicryl the subcutaneous tissue and skin was closed with 30 subcutaneous Vicryl stitch. No strictures were applied the patient was taken to the recovery room in stable condition extubated   Gretta Began, M.D. 01/18/2012 4:47 PM

## 2012-01-18 NOTE — H&P (View-Only) (Signed)
[facility]   Patient name: [name] [name] MRN: [id number] DOB: [birth year] Sex: male   Referred by: Eden Emms  Reason for referral:  Chief Complaint  Patient presents with  . Follow-up    c/o left leg pain and numbness left foot  . PVD    HISTORY OF PRESENT ILLNESS: The patient presents today as an urgent add-on to 2 progressive left leg ischemia. He has an extensive past history. He has had a history of iliac angioplasty and left external iliac and common femoral artery endarterectomy by Dr. Darrick Penna in March of 2012 he had a postoperative difficulty with healing and an infected lymphocele of which eventually healed after VAC closure. He had progressive claudication symptoms worse on the left and on the right. He recently underwent repeat arteriogram by Dr. Excell Seltzer on 12/16/2011. I have reviewed these films. At that time he had a right iliac stenting and reports that he has had improvement in his right leg symptoms. He extensive has extensive claudication of his left leg and also has classic rest pain in his foot. He reports that he has awakened at night and will walk in the room and said with his leg dangling for relief. He saw a urgent [facility] for second opinion and apparently had a repeat arteriogram. Their recommendation was surgical revision. He is seen today for further discussion of this. He does not have any tissue loss.  Past Medical History  Diagnosis Date  . Diabetes mellitus   . Hypertension   . Hyperlipidemia   . ITP (idiopathic thrombocytopenic purpura)   . Coronary artery disease     s/p cabg x 3 11/2003: lima-lad, seq vg to rpda and rpl  . Aortic stenosis     s/p st. jude mechanical AVR - Chronic Coumadin  . Claudication   . Diverticulitis of colon   . Hx of colonic polyps   . Peripheral vascular disease     s/p Left external Iliac Artery stenting and subsequent left femoral endarterectomy 02/2011- post op course complicated  by wound infxn req I&D 03/2011  . Bronchitis 12/17/11    "just gettin over a case"  . Anemia   . Blood transfusion     Past Surgical History  Procedure Date  . Splenectomy ~ 2003  . Coronary artery bypass graft   . Aortic valve replacement ~ 2004  . Angioplasty / stenting iliac     Left external Iliac Artery  . Coronary angioplasty with stent placement 12/16/11    "1"  . Cardiac valve replacement   . Tonsillectomy ~ 1952    History   Social History  . Marital Status: Legally Separated    Spouse Name: N/A    Number of Children: N/A  . Years of Education: N/A   Occupational History  . Not on file.   Social History Main Topics  . Smoking status: Former Smoker -- 1.5 packs/day for 30 years    Types: Cigarettes    Quit date: 12/15/1993  . Smokeless tobacco: Never Used  . Alcohol Use: 7.0 oz/week    14 drink(s) per week     drinks 2 martini's a night  . Drug Use: No  . Sexually Active: Yes   Other Topics Concern  . Not on file   Social History Narrative   Tries to remain active.  Frequent golfer but claudication limits this.    Family History  Problem Relation Age of Onset  . Coronary artery disease  Mother     bypass surgery - deceased  . Heart disease Father     murmur, valve replacement - deceased  . Colon cancer Neg Hx   . Breast cancer Sister   . Diabetes      grandmother    Allergies as of 01/12/2012  . (No Known Allergies)    Current Outpatient Prescriptions on File Prior to Visit  Medication Sig Dispense Refill  . amLODipine (NORVASC) 5 MG tablet Take 10 mg by mouth daily.       [name] amoxicillin (AMOXIL) 500 MG capsule Take 500 mg by mouth 2 (two) times daily as needed. Taken prior to dental visits      . atorvastatin (LIPITOR) 40 MG tablet Take 40 mg by mouth daily.       [name] glyBURIDE-metformin (GLUCOVANCE) 2.5-500 MG per tablet Take 2 tablets by mouth 2 (two) times daily at 10 AM and 5 PM.       . hydrochlorothiazide (MICROZIDE) 12.5 MG capsule Take  12.5 mg by mouth daily.        . metoprolol (TOPROL-XL) 50 MG 24 hr tablet Take 50 mg by mouth daily.        . trandolapril (MAVIK) 4 MG tablet Take 1 tablet (4 mg total) by mouth daily.  30 tablet  6  . warfarin (COUMADIN) 7.5 MG tablet Take 1 tablet (7.5 mg total) by mouth daily.  35 tablet  3  . clopidogrel (PLAVIX) 75 MG tablet Take 1 tablet (75 mg total) by mouth daily with breakfast.  30 tablet  3  . enoxaparin (LOVENOX) 120 MG/0.8ML SOLN Inject 0.8 mLs (120 mg total) into the skin daily.  1 Syringe  0     REVIEW OF SYSTEMS:  Positives indicated with an "X"  CARDIOVASCULAR:  [ ]  chest pain   [ ]  chest pressure   [ ]  palpitations   [ ]  orthopnea   [ ]  dyspnea on exertion   [x ] claudication   [x ] rest pain   [ ]  DVT   [ ]  phlebitis PULMONARY:   [ ]  productive cough   [ ]  asthma   [ ]  wheezing NEUROLOGIC:   [ ]  weakness  [ ]  paresthesias  [ ]  aphasia  [ ]  amaurosis  [ ]  dizziness HEMATOLOGIC:   [ ]  bleeding problems   [ ]  clotting disorders MUSCULOSKELETAL:  [ ]  joint pain   [ ]  joint swelling GASTROINTESTINAL: [ ]   blood in stool  [ ]   hematemesis GENITOURINARY:  [ ]   dysuria  [ ]   hematuria PSYCHIATRIC:  [ ]  history of major depression INTEGUMENTARY:  [ ]  rashes  [ ]  ulcers CONSTITUTIONAL:  [ ]  fever   [ ]  chills  PHYSICAL EXAMINATION:  General: The patient is a well-nourished male, in no acute distress. Vital signs are BP 176/73  Pulse 89  Resp 18  Ht 5\' 8"  (1.727 m)  Wt 178 lb 1.6 oz (80.786 kg)  BMI 27.08 kg/m2 Pulmonary: There is a good air exchange bilaterally without wheezing or rales. Abdomen: Soft and non-tender with normal pitch bowel sounds. Musculoskeletal: There are no major deformities.  There is no significant extremity pain. Neurologic: No focal weakness or paresthesias are detected, Skin: There are no ulcer or rashes noted. Psychiatric: The patient has normal affect. Cardiovascular: There is a regular rate and rhythm without significant murmur  appreciated. Pulse status: Palpable femoral pulses bilaterally. Absent left popliteal and distal pulses.   Aortogram with runoff  from 12/16/2011: Distal right common iliac artery angioplasty and stenting  Eccentric plaque in the external iliac artery just above the prior endarterectomy and high-grade smooth stenosis in the superficial femoral artery below the prior patch over approximately 3-4 cm.  Impression and Plan:  Had a long discussion with Mr. Word. He is unable to tolerate this level of claudication and also has clearcut rest pain. I do not see any endovascular treatment options. I did images saphenous vein with SonoSite and this does show patency of the saphenous vein on the right from his vein harvest site at the knee distally. He does have extensive varicosities of the saphenous vein itself appears to be acceptable for bypass. I explained that concern regarding his prior extensive wound issues in his left groin and the difficulty for exposure and potential for infectious complications with a redo. I explained that he would require a endarterectomy above the old area high up in the under the inguinal ligament and also a treatment down onto the superficial femoral artery. The scope and potentially all be taken care of with a long vein patch is the most durable option. Did explain the potential for failure of this with recurrent ischemic symptoms. He requested I assume his vascular surgical care which I agreed to. I certainly explained that the he has a challenging anatomy and the difficulty with the prior groin wound complications. He understands and wishes to proceed we will coordinate his Lovenox bridge off of his Coumadin and plan surgery for 01/18/2012    Deondra Wigger [facility] [place] Office: [phone]

## 2012-01-18 NOTE — Anesthesia Postprocedure Evaluation (Signed)
  Anesthesia Post-op Note  Patient: [name] [name]  Procedure(s) Performed: Procedure(s) (LRB): AORTA BIFEMORAL BYPASS GRAFT (N/A)  Patient Location: PACU  Anesthesia Type: General  Level of Consciousness: awake  Airway and Oxygen Therapy: Patient Spontanous Breathing  Post-op Pain: mild  Post-op Assessment: Post-op Vital signs reviewed  Post-op Vital Signs: stable  Complications: No apparent anesthesia complications

## 2012-01-18 NOTE — Progress Notes (Signed)
Dr. Sampson Goon notified of PTT, PT, INR-no further orders received.

## 2012-01-18 NOTE — Preoperative (Addendum)
Beta Blockers   Reason not to administer Beta Blockers: 01/17/12

## 2012-01-18 NOTE — Anesthesia Procedure Notes (Signed)
Procedure Name: Intubation Date/Time: 01/18/2012 8:17 AM Performed by: Malachi Pro Pre-anesthesia Checklist: Patient identified, Emergency Drugs available, Suction available, Patient being monitored and Timeout performed Patient Re-evaluated:Patient Re-evaluated prior to inductionOxygen Delivery Method: Circle System Utilized Intubation Type: Combination inhalational/ intravenous induction Ventilation: Mask ventilation without difficulty Laryngoscope Size: Mac and 4 Grade View: Grade III Tube type: Oral Tube size: 7.5 mm Number of attempts: 2 Airway Equipment and Method: stylet Placement Confirmation: ETT inserted through vocal cords under direct vision,  positive ETCO2 and breath sounds checked- equal and bilateral Secured at: 23 cm Tube secured with: Tape Dental Injury: Teeth and Oropharynx as per pre-operative assessment

## 2012-01-19 ENCOUNTER — Encounter (HOSPITAL_COMMUNITY): Payer: Self-pay | Admitting: Vascular Surgery

## 2012-01-19 ENCOUNTER — Inpatient Hospital Stay (HOSPITAL_COMMUNITY): Payer: Medicare Other

## 2012-01-19 DIAGNOSIS — I70219 Atherosclerosis of native arteries of extremities with intermittent claudication, unspecified extremity: Secondary | ICD-10-CM | POA: Diagnosis not present

## 2012-01-19 DIAGNOSIS — E119 Type 2 diabetes mellitus without complications: Secondary | ICD-10-CM | POA: Diagnosis not present

## 2012-01-19 DIAGNOSIS — Z09 Encounter for follow-up examination after completed treatment for conditions other than malignant neoplasm: Secondary | ICD-10-CM | POA: Diagnosis not present

## 2012-01-19 DIAGNOSIS — J9819 Other pulmonary collapse: Secondary | ICD-10-CM | POA: Diagnosis not present

## 2012-01-19 DIAGNOSIS — I708 Atherosclerosis of other arteries: Secondary | ICD-10-CM | POA: Diagnosis not present

## 2012-01-19 DIAGNOSIS — I700 Atherosclerosis of aorta: Secondary | ICD-10-CM | POA: Diagnosis not present

## 2012-01-19 LAB — AMYLASE: Amylase: 152 U/L — ABNORMAL HIGH (ref 0–105)

## 2012-01-19 LAB — GLUCOSE, CAPILLARY
Glucose-Capillary: 121 mg/dL — ABNORMAL HIGH (ref 70–99)
Glucose-Capillary: 153 mg/dL — ABNORMAL HIGH (ref 70–99)
Glucose-Capillary: 159 mg/dL — ABNORMAL HIGH (ref 70–99)
Glucose-Capillary: 170 mg/dL — ABNORMAL HIGH (ref 70–99)
Glucose-Capillary: 182 mg/dL — ABNORMAL HIGH (ref 70–99)

## 2012-01-19 LAB — CBC
MCH: 31.3 pg (ref 26.0–34.0)
MCV: 92.6 fL (ref 78.0–100.0)
Platelets: 177 10*3/uL (ref 150–400)
RDW: 13.9 % (ref 11.5–15.5)
WBC: 10.1 10*3/uL (ref 4.0–10.5)

## 2012-01-19 LAB — POCT I-STAT 3, ART BLOOD GAS (G3+)
Acid-base deficit: 2 mmol/L (ref 0.0–2.0)
Bicarbonate: 23.1 mEq/L (ref 20.0–24.0)
O2 Saturation: 93 %
Patient temperature: 38
pO2, Arterial: 75 mmHg — ABNORMAL LOW (ref 80.0–100.0)

## 2012-01-19 LAB — COMPREHENSIVE METABOLIC PANEL
AST: 21 U/L (ref 0–37)
Albumin: 2.7 g/dL — ABNORMAL LOW (ref 3.5–5.2)
Calcium: 7.9 mg/dL — ABNORMAL LOW (ref 8.4–10.5)
Creatinine, Ser: 0.68 mg/dL (ref 0.50–1.35)
Sodium: 139 mEq/L (ref 135–145)

## 2012-01-19 NOTE — Evaluation (Signed)
Physical Therapy Evaluation Patient Details Name: [name] [name] MRN: [id number] DOB: 06/23/1946 Today's Date: 01/19/2012  Problem List:  Patient Active Problem List  Diagnoses  . DIABETES MELLITUS  . HYPERCHOLESTEROLEMIA  . Immune thrombocytopenic purpura  . HYPERTENSION  . CAD  . AORTIC STENOSIS  . TIA  . CLAUDICATION  . DIVERTICULITIS, COLON  . COLONIC POLYPS, HX OF  . Coronary artery disease  . Aortic stenosis  . Peripheral vascular disease  . Claudication  . Hypertension  . Hyperlipidemia  . ITP (idiopathic thrombocytopenic purpura)  . Atherosclerosis of native arteries of the extremities with intermittent claudication    Past Medical History:  Past Medical History  Diagnosis Date  . Diabetes mellitus   . Hypertension   . Hyperlipidemia   . ITP (idiopathic thrombocytopenic purpura)   . Coronary artery disease     s/p cabg x 3 11/2003: lima-lad, seq vg to rpda and rpl  . Aortic stenosis     s/p st. jude mechanical AVR - Chronic Coumadin  . Claudication   . Diverticulitis of colon   . Hx of colonic polyps   . Peripheral vascular disease     s/p Left external Iliac Artery stenting and subsequent left femoral endarterectomy 02/2011- post op course complicated by wound infxn req I&D 03/2011  . Bronchitis 12/17/11    "just gettin over a case"  . Anemia   . Blood transfusion    Past Surgical History:  Past Surgical History  Procedure Date  . Splenectomy ~ 2003  . Coronary artery bypass graft   . Aortic valve replacement ~ 2004  . Angioplasty / stenting iliac     Left external Iliac Artery  . Coronary angioplasty with stent placement 12/16/11    "1"  . Tonsillectomy ~ 1952  . Cardiac valve replacement     aortic    PT Assessment/Plan/Recommendation PT Assessment Clinical Impression Statement: Pt s/p aortobifem bypass. Pt pleasant and moving well but will benefit from therapy to maximize mobility before discharge. Pt sats dropped to 80 on RA with ambulation  but quickly returned to 94 at rest on 2L. Pt encouraged to use incentive spirometer. PT Recommendation/Assessment: Patient will need skilled PT in the acute care venue PT Problem List: Decreased activity tolerance;Decreased mobility;Decreased knowledge of use of DME Barriers to Discharge: Decreased caregiver support PT Therapy Diagnosis : Difficulty walking;Abnormality of gait;Acute pain PT Plan PT Frequency: Min 3X/week PT Treatment/Interventions: Gait training;DME instruction;Stair training;Functional mobility training;Therapeutic activities;Therapeutic exercise;Patient/family education PT Recommendation Follow Up Recommendations: Home health PT Equipment Recommended: Rolling walker with 5" wheels PT Goals  Acute Rehab PT Goals PT Goal Formulation: With patient Time For Goal Achievement: 2 weeks Pt will go Supine/Side to Sit: with modified independence PT Goal: Supine/Side to Sit - Progress: Goal set today Pt will go Sit to Supine/Side: with modified independence PT Goal: Sit to Supine/Side - Progress: Goal set today Pt will go Sit to Stand: with modified independence PT Goal: Sit to Stand - Progress: Goal set today Pt will go Stand to Sit: with modified independence PT Goal: Stand to Sit - Progress: Goal set today Pt will Ambulate: >150 feet;with least restrictive assistive device;with modified independence PT Goal: Ambulate - Progress: Goal set today Pt will Go Up / Down Stairs: Flight;with rail(s);with modified independence PT Goal: Up/Down Stairs - Progress: Goal set today  PT Evaluation Precautions/Restrictions  Precautions Precautions: Fall Precaution Comments: O2 Prior Functioning  Home Living Lives With: Alone Type of Home: House Home Layout: One  level Home Access: Stairs to enter (flight down to [employer]) Entrance Stairs-Rails: Right Entrance Stairs-Number of Steps: 12 Bathroom Shower/Tub: Psychologist, counselling;Door [employer] Toilet: Standard Bathroom Accessibility:  Yes How Accessible: Accessible via walker Home Adaptive Equipment: None Prior Function Level of Independence: Independent with basic ADLs;Independent with transfers;Independent with gait;Independent with homemaking with ambulation Driving: Yes 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, retail Arousal/Alertness: Awake/alert Orientation Level: Oriented X4 Cognition - Other Comments: a few moments of difficulty with word finding Sensation/Coordination Sensation Light Touch: Appears Intact Extremity Assessment   Mobility (including Balance) Bed Mobility Bed Mobility: Yes Rolling Left: 6: Modified independent (Device/Increase time) Sit to Sidelying Right: 5: Supervision;HOB flat Sit to Sidelying Right Details (indicate cue type and reason): cueing to sequence Transfers Transfers: Yes Sit to Stand: 5: Supervision;From chair/3-in-1 Sit to Stand Details (indicate cue type and reason): cueing for hand placement Stand to Sit: 5: Supervision;To bed Ambulation/Gait Ambulation/Gait: Yes Ambulation/Gait Assistance: 5: Supervision Ambulation/Gait Assistance Details (indicate cue type and reason): cueing to step into RW and directional cues to return to room Ambulation Distance (Feet): 225 Feet Assistive device: Rolling walker Gait Pattern: Trunk flexed;Decreased stride length  Posture/Postural Control Posture/Postural Control: Postural limitations Postural Limitations: flexed trunk Balance Balance Assessed: No Exercise    End of Session PT - End of Session Equipment Utilized During Treatment: Gait belt Activity Tolerance: Patient tolerated treatment well Patient left: in bed;with call bell in reach Nurse Communication: Mobility status for transfers;Mobility status for ambulation General Behavior During Session: [facility] for tasks performed Cognition: Impaired (some difficulty with problem solving and word finding)  Delorse Lek 01/19/2012,  1:49 PM  Toney Sang, PT [phone]

## 2012-01-19 NOTE — Progress Notes (Signed)
Utilization review completed. Laporscha Linehan, RN, BSN. 01/19/12 

## 2012-01-19 NOTE — Progress Notes (Signed)
Subjective: Interval History: none.. Mod abd soreness  Objective: Vital signs in last 24 hours: Temp:  [95.6 F (35.3 C)-100.6 F (38.1 C)] 100.6 F (38.1 C) (02/12 0700) Pulse Rate:  [57-88] 81  (02/12 0700) Resp:  [11-23] 17  (02/12 0700) BP: (83-186)/(31-93) 131/52 mmHg (02/12 0700) SpO2:  [93 %-98 %] 95 % (02/12 0414) Arterial Line BP: (99-221)/(39-66) 163/44 mmHg (02/12 0700) Weight:  [178 lb 2.1 oz (80.8 kg)-181 lb 10.5 oz (82.4 kg)] 181 lb 10.5 oz (82.4 kg) (02/12 0414)  Intake/Output from previous day: 02/11 0701 - 02/12 0700 In: 7926 [I.V.:5681; Blood:455; NG/GT:90; IV Piggyback:1700] Out: 2560 [Urine:1410; Blood:1150] Intake/Output this shift:    General appearance: alert and no distress GI: soft, non tender, dressing dry and intact Extremities: groin without hematoma, palp popliteal pulses  Lab Results:  Basename 01/19/12 0413 01/18/12 1530  WBC 10.1 9.7  HGB 11.0* 12.0*  HCT 32.5* 35.4*  PLT 177 162   BMET  Basename 01/19/12 0413 01/18/12 1530  NA 139 135  K 4.5 4.5  CL 107 103  CO2 25 23  GLUCOSE 137* 297*  BUN 9 12  CREATININE 0.68 0.63  CALCIUM 7.9* 7.9*    Studies/Results: Dg Chest Portable 1 View  01/18/2012  *RADIOLOGY REPORT*  Clinical Data: Central line placement  PORTABLE CHEST - 1 VIEW  Comparison: 03/03/2011  Findings: Right IJ Swan-Ganz catheter extends to the to a lower lobe branch of the right pulmonary artery.  No pneumothorax. Nasogastric tube extends at least as far as the stomach, tip not seen.  Changes of previous median sternotomy and valve surgery. Low lung volumes with bibasilar atelectasis.  Heart size upper limits normal for technique.  Atheromatous aortic arch .  IMPRESSION:  1.  Right IJ Swan-Ganz the right lower lobe pulmonary artery; consider retracting 4-5 cm.  No pneumothorax. 2.  Nasogastric tube is least as far as the stomach.  Original Report Authenticated By: Osa Craver, M.D.    Anti-infectives: Anti-infectives     Start     Dose/Rate Route Frequency Ordered Stop   01/18/12 1900   cefUROXime (ZINACEF) 1.5 g in dextrose 5 % 50 mL IVPB        1.5 g 100 mL/hr over 30 Minutes Intravenous Every 12 hours 01/18/12 1727 01/19/12 0651   01/18/12 0645   cefUROXime (ZINACEF) 1.5 g in dextrose 5 % 50 mL IVPB  Status:  Discontinued        1.5 g 100 mL/hr over 30 Minutes Intravenous To Shortstay A & B 01/18/12 0644 01/18/12 1727   01/18/12 0627   cefUROXime (ZINACEF) injection 1.5 g  Status:  Discontinued        1.5 g Intramuscular 60 min pre-op 01/18/12 0627 01/18/12 0643   01/18/12 0000   cefUROXime (ZINACEF) 1.5 g in dextrose 5 % 50 mL IVPB  Status:  Discontinued        1.5 g 100 mL/hr over 30 Minutes Intravenous 60 min pre-op 01/17/12 1515 01/18/12 1412   01/17/12 1504   cefUROXime (ZINACEF) 1.5 g in dextrose 5 % 50 mL IVPB  Status:  Discontinued        1.5 g 100 mL/hr over 30 Minutes Intravenous 3 times per day 01/17/12 1504 01/17/12 1513          Assessment/Plan: s/p Procedure(s) (LRB): AORTA BIFEMORAL BYPASS GRAFT (N/A) Stable pod 1.  Transfer to 2000.  D/C ng tube and mobilize   LOS: 1 day   Rashan Rounsaville 01/19/2012,  7:23 AM

## 2012-01-19 NOTE — Progress Notes (Signed)
Occupational Therapy Evaluation Patient Details Name: [name] [name] MRN: [id number] DOB: 26-Apr-1946 Today's Date: 01/19/2012  Problem List:  Patient Active Problem List  Diagnoses  . DIABETES MELLITUS  . HYPERCHOLESTEROLEMIA  . Immune thrombocytopenic purpura  . HYPERTENSION  . CAD  . AORTIC STENOSIS  . TIA  . CLAUDICATION  . DIVERTICULITIS, COLON  . COLONIC POLYPS, HX OF  . Coronary artery disease  . Aortic stenosis  . Peripheral vascular disease  . Claudication  . Hypertension  . Hyperlipidemia  . ITP (idiopathic thrombocytopenic purpura)  . Atherosclerosis of native arteries of the extremities with intermittent claudication    Past Medical History:  Past Medical History  Diagnosis Date  . Diabetes mellitus   . Hypertension   . Hyperlipidemia   . ITP (idiopathic thrombocytopenic purpura)   . Coronary artery disease     s/p cabg x 3 11/2003: lima-lad, seq vg to rpda and rpl  . Aortic stenosis     s/p st. jude mechanical AVR - Chronic Coumadin  . Claudication   . Diverticulitis of colon   . Hx of colonic polyps   . Peripheral vascular disease     s/p Left external Iliac Artery stenting and subsequent left femoral endarterectomy 02/2011- post op course complicated by wound infxn req I&D 03/2011  . Bronchitis 12/17/11    "just gettin over a case"  . Anemia   . Blood transfusion    Past Surgical History:  Past Surgical History  Procedure Date  . Splenectomy ~ 2003  . Coronary artery bypass graft   . Aortic valve replacement ~ 2004  . Angioplasty / stenting iliac     Left external Iliac Artery  . Coronary angioplasty with stent placement 12/16/11    "1"  . Tonsillectomy ~ 1952  . Cardiac valve replacement     aortic  . Aorta - bilateral femoral artery bypass graft 01/18/2012    Procedure: AORTA BIFEMORAL BYPASS GRAFT;  Surgeon: Gretta Began, MD;  Location: [facility] OR;  Service: Vascular;  Laterality: N/A;    OT Assessment/Plan/Recommendation OT Assessment Clinical  Impression Statement: Pt. admitted for aorta bifemoral bypass graft and presents with the below problem list. Will benefit from skilled OT in the acute setting to increase I with ADL and ADL mobility to Mod I/I level upon d/c home OT Recommendation/Assessment: Patient will need skilled OT in the acute care venue OT Problem List: Decreased activity tolerance;Cardiopulmonary status limiting activity;Pain;Decreased knowledge of use of DME or AE;Decreased knowledge of precautions OT Therapy Diagnosis : Generalized weakness;Acute pain OT Plan OT Frequency: Min 2X/week OT Treatment/Interventions: Self-care/ADL training;Therapeutic exercise;DME and/or AE instruction;Therapeutic activities;Patient/family education OT Recommendation Follow Up Recommendations: No OT follow up Equipment Recommended:  (TBD- may benefit from 3n1 ) Individuals Consulted Consulted and Agree with Results and Recommendations: Patient OT Goals Acute Rehab OT Goals OT Goal Formulation: With patient Time For Goal Achievement: 7 days ADL Goals Pt Will Perform Lower Body Bathing: with modified independence;Sit to stand from chair ADL Goal: Lower Body Bathing - Progress: Goal set today Pt Will Perform Lower Body Dressing: with modified independence;Sit to stand from bed ADL Goal: Lower Body Dressing - Progress: Goal set today Pt Will Perform Tub/Shower Transfer: Shower transfer;with modified independence;Ambulation;Shower seat with back ADL Goal: Web designer - Progress: Goal set today  OT Evaluation Precautions/Restrictions  Precautions Precautions: Fall Precaution Comments: O2 Prior Functioning Home Living Lives With: Alone Type of Home: House Home Layout: One level Home Access: Stairs to enter (flight down to  townhouse) Entrance Stairs-Rails: Right Entrance Stairs-Number of Steps: 12 Bathroom Shower/Tub: Walk-in shower;Door [employer] Toilet: Standard Bathroom Accessibility: Yes How Accessible: Accessible  via walker Home Adaptive Equipment: None Prior Function Level of Independence: Independent with basic ADLs;Independent with transfers;Independent with gait;Independent with homemaking with ambulation Driving: Yes 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 Eating/Feeding: Simulated;Independent Where Assessed - Eating/Feeding: Chair Grooming: Simulated;Minimal assistance Where Assessed - Grooming: Standing at sink Upper Body Bathing: Simulated;Minimal assistance Where Assessed - Upper Body Bathing: Sitting, chair Lower Body Bathing: Simulated;Minimal assistance Lower Body Bathing Details (indicate cue type and reason): unable to reach lower legs Where Assessed - Lower Body Bathing: Sitting, bed Upper Body Dressing: Simulated;Minimal assistance Upper Body Dressing Details (indicate cue type and reason): limited by "soreness" Where Assessed - Upper Body Dressing: Sitting, chair Lower Body Dressing: Simulated;Maximal assistance Where Assessed - Lower Body Dressing: Sit to stand from chair Toilet Transfer: Simulated (Min guard A- VC for hand placement) Toilet Transfer Method: Ambulating Toileting - Clothing Manipulation: Not assessed Toileting - Hygiene: Not assessed Tub/Shower Transfer: Not assessed Ambulation Related to ADLs: Min guard A with RW ambulation >34ft ADL Comments: O2 sats remained stable until end of eval when they dropped to 79%. Placed pt back on 2L O2 and sats returned to normal Vision/Perception  Vision - History Patient Visual Report: No change from baseline Cognition Cognition Arousal/Alertness: Awake/alert Orientation Level: Oriented X4 Cognition - Other Comments: a few moments of difficulty with word finding Sensation/Coordination Sensation Light Touch: Appears Intact Coordination Gross Motor Movements are Fluid and Coordinated: Yes Fine Motor Movements are Fluid and Coordinated: Yes Extremity Assessment RUE  Assessment RUE Assessment: Within Functional Limits LUE Assessment LUE Assessment: Within Functional Limits Mobility  Bed Mobility Bed Mobility: Yes Rolling Left: 6: Modified independent (Device/Increase time) Sit to Sidelying Right: 5: Supervision;HOB flat Sit to Sidelying Right Details (indicate cue type and reason): cueing to sequence Transfers Sit to Stand: 5: Supervision;From chair/3-in-1 Sit to Stand Details (indicate cue type and reason): cueing for hand placement Stand to Sit: 5: Supervision;To bed End of Session OT - End of Session Equipment Utilized During Treatment: Gait belt Activity Tolerance: Patient limited by pain (limited by dec O2 sats) Nurse Communication: Mobility status for ambulation General Behavior During Session: [facility] for tasks performed Cognition: Impaired (some difficulty with problem solving and word finding)   Crosley Stejskal 01/19/2012, 4:01 PM

## 2012-01-20 LAB — GLUCOSE, CAPILLARY

## 2012-01-20 MED FILL — Heparin Sodium (Porcine) Inj 1000 Unit/ML: INTRAMUSCULAR | Qty: 30 | Status: AC

## 2012-01-20 MED FILL — Sodium Chloride Irrigation Soln 0.9%: Qty: 3000 | Status: AC

## 2012-01-20 MED FILL — Sodium Chloride IV Soln 0.9%: INTRAVENOUS | Qty: 1000 | Status: AC

## 2012-01-20 NOTE — Progress Notes (Signed)
Subjective: Interval History: none..Looks great. No n/v   Objective: Vital signs in last 24 hours: Temp:  [98.2 F (36.8 C)-100.6 F (38.1 C)] 98.2 F (36.8 C) (02/13 0600) Pulse Rate:  [81-95] 90  (02/13 0600) Resp:  [17-24] 18  (02/13 0600) BP: (125-163)/(40-75) 157/68 mmHg (02/13 0600) SpO2:  [92 %-96 %] 96 % (02/13 0600) Arterial Line BP: (152-177)/(44-45) 152/44 mmHg (02/12 0900)  Intake/Output from previous day: 02/12 0701 - 02/13 0700 In: 343.5 [I.V.:343.5] Out: 950 [Urine:950] Intake/Output this shift: Total I/O In: -  Out: 750 [Urine:750]  General appearance: alert and no distress GI: soft, wd healing Extremities: groin wds healing. no erythema, palp pop pulses  Lab Results:  Basename 01/19/12 0413 01/18/12 1530  WBC 10.1 9.7  HGB 11.0* 12.0*  HCT 32.5* 35.4*  PLT 177 162   BMET  Basename 01/19/12 0413 01/18/12 1530  NA 139 135  K 4.5 4.5  CL 107 103  CO2 25 23  GLUCOSE 137* 297*  BUN 9 12  CREATININE 0.68 0.63  CALCIUM 7.9* 7.9*    Studies/Results: Dg Chest Portable 1 View  01/19/2012  *RADIOLOGY REPORT*  Clinical Data: Postoperative radiograph.  Aortobifemoral bypass graft.  PORTABLE CHEST - 1 VIEW  Comparison: 01/18/2012.  Findings: Nasogastric tube is present with the tip not visualized. Right IJ vascular sheath transmitting a Swan-Ganz catheter.  The tip is in the descending right pulmonary artery.  This is slightly more proximal than on yesterday's examination.  Bilateral basilar atelectasis.  No airspace disease.  Cardiopericardial silhouette is within normal limits for projection.  Tortuous atherosclerotic aortic arch.  Postoperative changes of aortic valve replacement.  IMPRESSION:  1.  Stable enteric tube and Swan-Ganz catheter.  Tip of the Swan- Ganz catheter is in the descending right pulmonary artery. 2.  Bilateral basilar atelectasis.  Original Report Authenticated By: Andreas Newport, M.D.   Dg Chest Portable 1 View  01/18/2012  *RADIOLOGY  REPORT*  Clinical Data: Central line placement  PORTABLE CHEST - 1 VIEW  Comparison: 03/03/2011  Findings: Right IJ Swan-Ganz catheter extends to the to a lower lobe branch of the right pulmonary artery.  No pneumothorax. Nasogastric tube extends at least as far as the stomach, tip not seen.  Changes of previous median sternotomy and valve surgery. Low lung volumes with bibasilar atelectasis.  Heart size upper limits normal for technique.  Atheromatous aortic arch .  IMPRESSION:  1.  Right IJ Swan-Ganz the right lower lobe pulmonary artery; consider retracting 4-5 cm.  No pneumothorax. 2.  Nasogastric tube is least as far as the stomach.  Original Report Authenticated By: Osa Craver, M.D.   Anti-infectives: Anti-infectives     Start     Dose/Rate Route Frequency Ordered Stop   01/18/12 1900   cefUROXime (ZINACEF) 1.5 g in dextrose 5 % 50 mL IVPB        1.5 g 100 mL/hr over 30 Minutes Intravenous Every 12 hours 01/18/12 1727 01/19/12 0651   01/18/12 0645   cefUROXime (ZINACEF) 1.5 g in dextrose 5 % 50 mL IVPB  Status:  Discontinued        1.5 g 100 mL/hr over 30 Minutes Intravenous To Shortstay A & B 01/18/12 0644 01/18/12 1727   01/18/12 0627   cefUROXime (ZINACEF) injection 1.5 g  Status:  Discontinued        1.5 g Intramuscular 60 min pre-op 01/18/12 0627 01/18/12 0643   01/18/12 0000   cefUROXime (ZINACEF) 1.5 g in dextrose 5 %  50 mL IVPB  Status:  Discontinued        1.5 g 100 mL/hr over 30 Minutes Intravenous 60 min pre-op 01/17/12 1515 01/18/12 1412   01/17/12 1504   cefUROXime (ZINACEF) 1.5 g in dextrose 5 % 50 mL IVPB  Status:  Discontinued        1.5 g 100 mL/hr over 30 Minutes Intravenous 3 times per day 01/17/12 1504 01/17/12 1513          Assessment/Plan: s/p Procedure(s) (LRB): AORTA BIFEMORAL BYPASS GRAFT (N/A) Stable pod 2, start clear liquids, d/c sleeve and foley   LOS: 2 days   Alie Hardgrove 01/20/2012, 6:40 AM

## 2012-01-20 NOTE — Progress Notes (Signed)
Pt ambulated 350 feet with NT today.  On 3 liters of 02. Pt tolerated walk well

## 2012-01-20 NOTE — Progress Notes (Signed)
Physical Therapy Treatment Patient Details Name: [name] [name] MRN: [id number] DOB: Apr 13, 1946 Today's Date: 01/20/2012  PT Assessment/Plan  PT - Assessment/Plan Comments on Treatment Session: Pt states that he feels like he tolerated session better today compared to last PT session.  02 sats dropped to 86% RA but returned to 94% with 2 L02.  Encouraged pt to increase activity & ambulate with Nsing 2-3x's/day.  Pt states that anticipated d/c day will be Friday.     PT Frequency: Min 3X/week Follow Up Recommendations: Home health PT Equipment Recommended: Rolling walker with 5" wheels PT Goals  Acute Rehab PT Goals PT Goal: Sit to Stand - Progress: Not met PT Goal: Stand to Sit - Progress: Not met PT Goal: Ambulate - Progress: Not met  PT Treatment Precautions/Restrictions  Precautions Precautions: Fall Precaution Comments: 02 Mobility (including Balance) Bed Mobility Bed Mobility: No Transfers Sit to Stand: 5: Supervision;From chair/3-in-1;With upper extremity assist;With armrests Sit to Stand Details (indicate cue type and reason): Cues for hand placement.  Performed 3x's.  Stand to Sit: 5: Supervision;To chair/3-in-1;With upper extremity assist;With armrests Stand to Sit Details: Cues for use of UE's to control descent.    Ambulation/Gait Ambulation/Gait Assistance: 5: Supervision Ambulation/Gait Assistance Details (indicate cue type and reason): Cues to increase/maintain upright posture, cues for pursed lip breathing, & stay inside RW.   Pt's 02 sats 88% RA when returned back to room after ambulation.  Returned back to 90-95% with pursed lip breathing.  Pt placed back on 2L 02 after session.  Ambulation Distance (Feet): 200 Feet Assistive device: Rolling walker Gait Pattern: Step-through pattern;Decreased stride length;Trunk flexed Stairs: No Wheelchair Mobility Wheelchair Mobility: No  Static Standing Balance Static Standing - Balance Support: No upper extremity  supported Static Standing - Level of Assistance: 5: Stand by assistance Static Standing - Comment/# of Minutes: No sway noted with static standing.  Standing activities consisted of: eyes open/closed, feet together/apart, perturbations in all directions, tandem stance.  Required UE support on RW to position feet in tandem stance but then able to maintain balance without UE support for approx 30 secs.  Tolerated min perturbations at shoulders in all directions.   Exercise    End of Session PT - End of Session Equipment Utilized During Treatment: Gait belt Activity Tolerance: Patient tolerated treatment well Patient left: in chair;with call bell in reach;with family/visitor present Nurse Communication: Mobility status for ambulation General Behavior During Session: [facility] for tasks performed Cognition: Impaired (increased time for problem solving.  )  [name] [name] 01/20/2012, 11:58 AM [phone]

## 2012-01-21 LAB — GLUCOSE, CAPILLARY
Glucose-Capillary: 163 mg/dL — ABNORMAL HIGH (ref 70–99)
Glucose-Capillary: 247 mg/dL — ABNORMAL HIGH (ref 70–99)

## 2012-01-21 MED ORDER — BISACODYL 10 MG RE SUPP
10.0000 mg | Freq: Every day | RECTAL | Status: DC | PRN
  Administered 2012-01-21: 10 mg via RECTAL
  Filled 2012-01-21: qty 1

## 2012-01-21 MED ORDER — OXYCODONE-ACETAMINOPHEN 5-325 MG PO TABS: 2.0000 | ORAL_TABLET | ORAL | Status: DC | PRN

## 2012-01-21 NOTE — Progress Notes (Signed)
Subjective: Interval History: none.Tolerated liquid diet.   Objective: Vital signs in last 24 hours: Temp:  [96.9 F (36.1 C)-98.7 F (37.1 C)] 98.6 F (37 C) (02/14 0354) Pulse Rate:  [88-91] 91  (02/14 0354) Resp:  [18-20] 18  (02/14 0356) BP: (143-160)/(68-81) 152/68 mmHg (02/14 0354) SpO2:  [86 %-99 %] 99 % (02/14 0356) FiO2 (%):  [98 %] 98 % (02/13 1127)  Intake/Output from previous day: 02/13 0701 - 02/14 0700 In: 360 [P.O.:360] Out: 300 [Urine:300] Intake/Output this shift:    General appearance: alert and no distress GI: soft nontender.  Groin with serous drainage left  Lab Results:  [facility] 01/19/12 0413 01/18/12 1530  WBC 10.1 9.7  HGB 11.0* 12.0*  HCT 32.5* 35.4*  PLT 177 162   BMET  Basename 01/19/12 0413 01/18/12 1530  NA 139 135  K 4.5 4.5  CL 107 103  CO2 25 23  GLUCOSE 137* 297*  BUN 9 12  CREATININE 0.68 0.63  CALCIUM 7.9* 7.9*    Studies/Results: Dg Chest Portable 1 View  01/19/2012  *RADIOLOGY REPORT*  Clinical Data: Postoperative radiograph.  Aortobifemoral bypass graft.  PORTABLE CHEST - 1 VIEW  Comparison: 01/18/2012.  Findings: Nasogastric tube is present with the tip not visualized. Right IJ vascular sheath transmitting a Swan-Ganz catheter.  The tip is in the descending right pulmonary artery.  This is slightly more proximal than on yesterday's examination.  Bilateral basilar atelectasis.  No airspace disease.  Cardiopericardial silhouette is within normal limits for projection.  Tortuous atherosclerotic aortic arch.  Postoperative changes of aortic valve replacement.  IMPRESSION:  1.  Stable enteric tube and Swan-Ganz catheter.  Tip of the Swan- Ganz catheter is in the descending right pulmonary artery. 2.  Bilateral basilar atelectasis.  Original Report Authenticated By: Andreas Newport, M.D.   Dg Chest Portable 1 View  01/18/2012  *RADIOLOGY REPORT*  Clinical Data: Central line placement  PORTABLE CHEST - 1 VIEW  Comparison: 03/03/2011   Findings: Right IJ Swan-Ganz catheter extends to the to a lower lobe branch of the right pulmonary artery.  No pneumothorax. Nasogastric tube extends at least as far as the stomach, tip not seen.  Changes of previous median sternotomy and valve surgery. Low lung volumes with bibasilar atelectasis.  Heart size upper limits normal for technique.  Atheromatous aortic arch .  IMPRESSION:  1.  Right IJ Swan-Ganz the right lower lobe pulmonary artery; consider retracting 4-5 cm.  No pneumothorax. 2.  Nasogastric tube is least as far as the stomach.  Original Report Authenticated By: Osa Craver, M.D.   Anti-infectives: Anti-infectives     Start     Dose/Rate Route Frequency Ordered Stop   01/18/12 1900   cefUROXime (ZINACEF) 1.5 g in dextrose 5 % 50 mL IVPB        1.5 g 100 mL/hr over 30 Minutes Intravenous Every 12 hours 01/18/12 1727 01/19/12 0651   01/18/12 0645   cefUROXime (ZINACEF) 1.5 g in dextrose 5 % 50 mL IVPB  Status:  Discontinued        1.5 g 100 mL/hr over 30 Minutes Intravenous To Shortstay A & B 01/18/12 0644 01/18/12 1727   01/18/12 0627   cefUROXime (ZINACEF) injection 1.5 g  Status:  Discontinued        1.5 g Intramuscular 60 min pre-op 01/18/12 0627 01/18/12 0643   01/18/12 0000   cefUROXime (ZINACEF) 1.5 g in dextrose 5 % 50 mL IVPB  Status:  Discontinued  1.5 g 100 mL/hr over 30 Minutes Intravenous 60 min pre-op 01/17/12 1515 01/18/12 1412   01/17/12 1504   cefUROXime (ZINACEF) 1.5 g in dextrose 5 % 50 mL IVPB  Status:  Discontinued        1.5 g 100 mL/hr over 30 Minutes Intravenous 3 times per day 01/17/12 1504 01/17/12 1513          Assessment/Plan: s/p Procedure(s) (LRB): AORTA BIFEMORAL BYPASS GRAFT (N/A) Advance diet, Dulcalax suppository, dc pca, saline lock iv   LOS: 3 days   [name] [name] 01/21/2012, 6:59 AM

## 2012-01-21 NOTE — Progress Notes (Signed)
Inpatient Diabetes Program Recommendations  AACE/ADA: New Consensus Statement on Inpatient Glycemic Control (2009)  Target Ranges:  Prepandial:   less than 140 mg/dL      Peak postprandial:   less than 180 mg/dL (1-2 hours)      Critically ill patients:  140 - 180 mg/dL   Reason for Visit:  Inpatient Diabetes Program Recommendations Correction (SSI): Change Novolog correction scale to [facility].  Note:

## 2012-01-21 NOTE — Progress Notes (Signed)
Pt has had serous drainage from his left going site today.  I put a dry dressing over the site.  Lianne Cure PA notified will continue to monitor closely

## 2012-01-21 NOTE — Progress Notes (Signed)
Physical Therapy Treatment Patient Details Name: [name] [name] MRN: [id number] DOB: 29-Nov-1946 Today's Date: 01/21/2012  PT Assessment/Plan  PT - Assessment/Plan Comments on Treatment Session: Pt s/p aortobifem BPG. Pt with improved ambulation and ability to perform stairs. Continues to have balance defiicits and needs RW for mobility. PT Plan: Discharge plan remains appropriate;Frequency remains appropriate PT Goals  Acute Rehab PT Goals PT Goal: Sit to Stand - Progress: Met PT Goal: Stand to Sit - Progress: Met PT Goal: Ambulate - Progress: Progressing toward goal PT Goal: Up/Down Stairs - Progress: Progressing toward goal  PT Treatment Precautions/Restrictions  Precautions Precautions: Fall Precaution Comments:  Mobility (including Balance) Bed Mobility Bed Mobility: No Transfers Sit to Stand: 6: Modified independent (Device/Increase time);From chair/3-in-1;With armrests Stand to Sit: 6: Modified independent (Device/Increase time);To chair/3-in-1;With armrests Ambulation/Gait Ambulation/Gait Assistance: 5: Supervision Ambulation/Gait Assistance Details (indicate cue type and reason): pt ambulated 50 ft without AD with min assist due to unsteady gait Ambulation Distance (Feet): 500 Feet Assistive device: Rolling walker Gait Pattern: Within Functional Limits;Trunk flexed Stairs: Yes Stairs Assistance: 5: Supervision Stair Management Technique: One rail Right;Forwards;Step to pattern Number of Stairs: 11  Height of Stairs: 8   Posture/Postural Control Posture/Postural Control: Postural limitations Exercise  General Exercises - Lower Extremity Long Arc Quad: AROM;20 reps;Both;Seated Hip Flexion/Marching: AROM;Both;Other reps (comment);Seated (20reps) End of Session PT - End of Session Activity Tolerance: Patient tolerated treatment well Patient left: in chair;with call bell in reach;with family/visitor present Nurse Communication: Mobility status for transfers;Mobility  status for ambulation General Behavior During Session: [facility] for tasks performed Cognition: [facility] for tasks performed  Delorse Lek 01/21/2012, 12:22 PM Toney Sang, PT [phone]

## 2012-01-21 NOTE — Progress Notes (Signed)
Pt ambulated 600 ft with a walker on room air, without stopping for breaks. No pt complaints. Positioned pt in the bed. SaO2 93% RA. Pt requested to leave oxygen off. Will continue to monitor

## 2012-01-21 NOTE — Progress Notes (Signed)
Exchanged pt morphine PCA medication. Administration and waste confirmed by [employer]. Wasted 2ml. Will continue to monitor

## 2012-01-21 NOTE — Progress Notes (Signed)
Placed 2L nasal cannula on pt due to SaO2 88% RA while asleep. Will try to wean again in the morning when pt is awake. Will continue to monitor

## 2012-01-22 LAB — GLUCOSE, CAPILLARY
Glucose-Capillary: 172 mg/dL — ABNORMAL HIGH (ref 70–99)
Glucose-Capillary: 196 mg/dL — ABNORMAL HIGH (ref 70–99)

## 2012-01-22 MED ORDER — ENOXAPARIN SODIUM 120 MG/0.8ML ~~LOC~~ SOLN: 120.0000 mg | SUBCUTANEOUS | Status: DC

## 2012-01-22 MED ORDER — OXYCODONE-ACETAMINOPHEN 5-325 MG PO TABS: 1.0000 | ORAL_TABLET | Freq: Four times a day (QID) | ORAL | Status: AC | PRN

## 2012-01-22 NOTE — Progress Notes (Signed)
Occupational Therapy Treatment and Discharge Patient Details Name: [name] [name] MRN: [id number] DOB: 1946-02-23 Today's Date: 01/22/2012  OT Assessment/Plan OT Assessment/Plan Comments on Treatment Session: Pt doing well. All goals met and pt to dc today. Will d/c from acute OT OT Plan: All goals met and education completed, patient discharged from OT services OT Goals ADL Goals ADL Goal: Lower Body Bathing - Progress: Met ADL Goal: Lower Body Dressing - Progress: Met ADL Goal: Tub/Shower Transfer - Progress: Met  OT Treatment Precautions/Restrictions  Precautions Precautions: Fall   ADL ADL Lower Body Bathing: Simulated;Independent Where Assessed - Lower Body Bathing: Sit to stand from bed Lower Body Dressing: Performed;Independent Where Assessed - Lower Body Dressing: Sit to stand from bed Toilet Transfer: Simulated;Independent Toilet Transfer Method: Ambulating Tub/Shower Transfer: Simulated;Modified independent (simulated in room; patient hold to wall to steady self) Tub/Shower Transfer Method: Ambulating Tub/Shower Transfer Equipment: Shower seat with back Mobility  Bed Mobility Rolling Left: 6: Modified independent (Device/Increase time);With rail End of Session OT - End of Session Equipment Utilized During Treatment: Gait belt Activity Tolerance: Patient tolerated treatment well Patient left: in bed;with call bell in reach;with family/visitor present General Behavior During Session: [facility] for tasks performed Cognition: [facility] for tasks performed  Reeshemah Nazaryan  01/22/2012, 2:14 PM

## 2012-01-22 NOTE — Discharge Summary (Signed)
[facility] Discharge Summary   Patient ID:  [name] [name] MRN: [id number] DOB/AGE: 08-08-1946 66 y.o.  Admit date: 01/18/2012 Discharge date: 01/22/2012 Date of Surgery: 01/18/2012 Surgeon: Surgeon(s): Gretta Began, MD  Admission Diagnosis: left leg ischemia; peripheral vascular disease  Discharge Diagnoses:  left leg ischemia; peripheral vascular disease  Secondary Diagnoses: Past Medical History  Diagnosis Date  . Diabetes mellitus   . Hypertension   . Hyperlipidemia   . ITP (idiopathic thrombocytopenic purpura)   . Coronary artery disease     s/p cabg x 3 11/2003: lima-lad, seq vg to rpda and rpl  . Aortic stenosis     s/p [facility] mechanical AVR - Chronic Coumadin  . Claudication   . Diverticulitis of colon   . Hx of colonic polyps   . Peripheral vascular disease     s/p Left external Iliac Artery stenting and subsequent left femoral endarterectomy 02/2011- post op course complicated by wound infxn req I&D 03/2011  . Bronchitis 12/17/11    "just gettin over a case"  . Anemia   . Blood transfusion     Procedure(s): AORTA BIFEMORAL BYPASS GRAFT  Discharged Condition: good  HPI: The patient presents today as an urgent add-on to 2 progressive left leg ischemia. He has an extensive past history. He has had a history of iliac angioplasty and left external iliac and common femoral artery endarterectomy by Dr. Darrick Penna in March of 2012 he had a postoperative difficulty with healing and an infected lymphocele of which eventually healed after VAC closure. He had progressive claudication symptoms worse on the left and on the right. He recently underwent repeat arteriogram by Dr. Excell Seltzer on 12/16/2011. I have reviewed these films. At that time he had a right iliac stenting and reports that he has had improvement in his right leg symptoms. He extensive has extensive claudication of his left leg and also has classic rest pain in his foot. He reports that he has awakened  at night and will walk in the room and said with his leg dangling for relief. He saw a urgent [facility] for second opinion and apparently had a repeat arteriogram. Their recommendation was surgical revision. He is seen today for further discussion of this. He does not have any tissue loss.     Hospital Course:  [name] [name] is a 66 y.o. male is S/P Bilateral Procedure(s): AORTA BIFEMORAL BYPASS GRAFT Extubated: POD # 0 Post-op wounds healing well, min. SS drainage left incision. Pt. Ambulating, voiding and taking PO diet without difficulty. Pt pain controlled with PO pain meds. Labs as below Complications:none  Consults:     Significant Diagnostic Studies: CBC Lab Results  Component Value Date   WBC 10.1 01/19/2012   HGB 11.0* 01/19/2012   HCT 32.5* 01/19/2012   MCV 92.6 01/19/2012   PLT 177 01/19/2012    BMET    Component Value Date/Time   NA 139 01/19/2012 0413   K 4.5 01/19/2012 0413   CL 107 01/19/2012 0413   CO2 25 01/19/2012 0413   GLUCOSE 137* 01/19/2012 0413   BUN 9 01/19/2012 0413   CREATININE 0.68 01/19/2012 0413   CALCIUM 7.9* 01/19/2012 0413   GFRNONAA >90 01/19/2012 0413   GFRAA >90 01/19/2012 0413   COAG Lab Results  Component Value Date   INR 1.17 01/18/2012   INR 1.36 01/15/2012   INR 1.9 01/12/2012   PROTIME 16.1 05/22/2009   PROTIME 23.6 05/08/2009     Disposition:  Discharge to :Home Discharge Orders    Future Appointments: Provider: Department: Dept Phone: Center:   02/09/2012 3:45 PM Wendall Stade, MD [facility] [phone] [facility]      [name], [name]  Home Medication Instructions GEX:[id number]   Printed on:01/22/12 [phone]  Medication Information                    glyBURIDE-metformin (GLUCOVANCE) 2.5-500 MG per tablet Take 2 tablets by mouth 2 (two) times daily at 10 AM and 5 PM.            atorvastatin (LIPITOR) 40 MG tablet Take 40 mg by mouth daily.            amoxicillin (AMOXIL) 500 MG capsule Take 500 mg by  mouth 2 (two) times daily as needed. Taken prior to dental visits           amLODipine (NORVASC) 5 MG tablet Take 10 mg by mouth daily.            hydrochlorothiazide (MICROZIDE) 12.5 MG capsule Take 12.5 mg by mouth daily.             metoprolol (TOPROL-XL) 50 MG 24 hr tablet Take 50 mg by mouth daily.             warfarin (COUMADIN) 7.5 MG tablet Take 1 tablet (7.5 mg total) by mouth daily.           trandolapril (MAVIK) 4 MG tablet Take 1 tablet (4 mg total) by mouth daily.           enoxaparin (LOVENOX) 120 MG/0.8ML SOLN Inject 0.8 mLs (120 mg total) into the skin daily.            Verbal and written Discharge instructions given to the patient. Wound care per Discharge AVS Dry dressing daily until no drainage.   F/U with Dr. Arbie Cookey in 2 weeks.    SignedMosetta Pigeon 01/22/2012, 9:18 AM

## 2012-01-22 NOTE — Progress Notes (Signed)
Utilization review completed. Kurk Corniel, RN, BSN. 

## 2012-01-22 NOTE — Progress Notes (Signed)
Pt ambulated 600 ft on room air with a walker with no breaks. Pt tolerated very well. No complaints. Dressing changed on left groin.  Moderate amount of serous drainage on dressing. Gauze reapplied. Will continue to monitor

## 2012-01-25 LAB — TYPE AND SCREEN
Donor AG Type: NEGATIVE
Unit division: 0
Unit division: 0

## 2012-01-27 ENCOUNTER — Ambulatory Visit (INDEPENDENT_AMBULATORY_CARE_PROVIDER_SITE_OTHER): Payer: Medicare Other | Admitting: *Deleted

## 2012-01-27 DIAGNOSIS — I359 Nonrheumatic aortic valve disorder, unspecified: Secondary | ICD-10-CM | POA: Diagnosis not present

## 2012-01-27 DIAGNOSIS — G459 Transient cerebral ischemic attack, unspecified: Secondary | ICD-10-CM

## 2012-01-27 LAB — POCT INR: INR: 2.3

## 2012-02-01 ENCOUNTER — Encounter: Payer: Medicare Other | Admitting: *Deleted

## 2012-02-01 ENCOUNTER — Encounter: Payer: Self-pay | Admitting: Vascular Surgery

## 2012-02-02 ENCOUNTER — Ambulatory Visit (INDEPENDENT_AMBULATORY_CARE_PROVIDER_SITE_OTHER): Payer: Medicare Other | Admitting: Vascular Surgery

## 2012-02-02 ENCOUNTER — Encounter: Payer: Self-pay | Admitting: Vascular Surgery

## 2012-02-02 VITALS — BP 134/66 | HR 91 | Resp 18 | Ht 68.0 in | Wt 178.0 lb

## 2012-02-02 DIAGNOSIS — I739 Peripheral vascular disease, unspecified: Secondary | ICD-10-CM | POA: Insufficient documentation

## 2012-02-02 NOTE — Progress Notes (Signed)
The patient presents today for followup of his aortobifemoral bypass and bilateral femoral endarterectomies surgery was on 01/18/2012. He did extremely well was discharged home on postoperative day 5. He did have a prior extensive groin wound difficulty in the left groin requiring Vac closure. He did have some serous drainage from his left carotid discharge from the hospital. He has the usual recovery. He has a diminished am and diminished appetite. His bowel function has returned to normal. He does have some tingling in his left foot and I explained this is due to the profound ischemia and rest pain he had prior to surgery. He is back on his Coumadin therapy.  Physical exam: Abdominal wound is well healed he does have a subcuticular closure. Groin incisions are healing nicely as well there is no further drainage from the left groin and there is no evidence of erythema or fluctuance. He does have a 2+ popliteal pulse on the left.  Impression and plan: Stable [name] [name] recovery from aortobifemoral bypass on February 11. Potassium again in 2 months for continued followup. He knows to notify should he develop any erythema drainage or other difficulty

## 2012-02-09 ENCOUNTER — Encounter: Payer: Self-pay | Admitting: Cardiovascular Disease

## 2012-02-09 ENCOUNTER — Ambulatory Visit (INDEPENDENT_AMBULATORY_CARE_PROVIDER_SITE_OTHER): Payer: Medicare Other | Admitting: Cardiovascular Disease

## 2012-02-09 VITALS — BP 157/78 | HR 81 | Ht 68.0 in | Wt 172.8 lb

## 2012-02-09 DIAGNOSIS — E7800 Pure hypercholesterolemia, unspecified: Secondary | ICD-10-CM | POA: Diagnosis not present

## 2012-02-09 DIAGNOSIS — I359 Nonrheumatic aortic valve disorder, unspecified: Secondary | ICD-10-CM | POA: Diagnosis not present

## 2012-02-09 DIAGNOSIS — R609 Edema, unspecified: Secondary | ICD-10-CM | POA: Diagnosis not present

## 2012-02-09 DIAGNOSIS — I10 Essential (primary) hypertension: Secondary | ICD-10-CM | POA: Diagnosis not present

## 2012-02-09 DIAGNOSIS — I739 Peripheral vascular disease, unspecified: Secondary | ICD-10-CM

## 2012-02-09 NOTE — Patient Instructions (Signed)
Your physician wants you to follow-up in: 6 MONTHS WITH DR Haywood Filler will receive a reminder letter in the mail two months in advance. If you don't receive a letter, please call our office to schedule the follow-up appointment. Your physician recommends that you continue on your current medications as directed. Please refer to the Current Medication list given to you today. Your physician has requested that you have a lower or upper extremity venous duplex. This test is an ultrasound of the veins in the legs or arms. It looks at venous blood flow that carries blood from the heart to the legs or arms. Allow one hour for a Lower Venous exam. Allow thirty minutes for an Upper Venous exam. There are no restrictions or special instructions. LEFT LEG DX EDEMA

## 2012-02-09 NOTE — Assessment & Plan Note (Signed)
Cholesterol is at goal.  Continue current dose of statin and diet Rx.  No myalgias or side effects.  F/U  LFT's in 6 months. No results found for this basename: LDLCALC             

## 2012-02-09 NOTE — Assessment & Plan Note (Signed)
Well controlled.  Continue current medications and low sodium Dash type diet.    

## 2012-02-09 NOTE — Progress Notes (Signed)
Patient ID: [name] [name], male   DOB: [birth year], 66 y.o.   MRN: [id number] S/P complex redo vascular surgery by Dr Early 3 weeks ago.  Previous left groin infection with vascular surgery.  Resting claudication resolved but still with mild sensory neuropathy.  S/P AVR on coumadin.  No SSCP, fevers palpitations or dyspnea.  Compliant with meds and coumadin followed at our clinic.  LLE edema post op.  May represent reperfusion edema as his circulation was severely impaired.  Need to R/O DVT.  Will F/U with Dr Arbie Cookey in two months for arterial duplex and ABI's.  ROS: Denies fever, malais, weight loss, blurry vision, decreased visual acuity, cough, sputum, SOB, hemoptysis, pleuritic pain, palpitaitons, heartburn, abdominal pain, melena, lower extremity edema, claudication, or rash.  All other systems reviewed and negative  General: Affect appropriate Healthy:  appears stated age HEENT: normal Neck supple with no adenopathy JVP normal no bruits no thyromegaly Lungs clear with no wheezing and good diaphragmatic motion Heart:  S1/S2 click  no murmur, no rub, gallop or click PMI normal Abdomen: benighn, BS positve, no tenderness, no AAA no bruit.  No HSM or HJR Distal pulses:  Palpable at PT;s  Scar tissue in both groins but serous drainage stopped.   No edema Neuro non-focal Skin warm and dry No muscular weakness   Current Outpatient Prescriptions  Medication Sig Dispense Refill  . amLODipine (NORVASC) 5 MG tablet Take 10 mg by mouth daily.       [name] amoxicillin (AMOXIL) 500 MG capsule Take 500 mg by mouth 2 (two) times daily as needed. Taken prior to dental visits      . atorvastatin (LIPITOR) 40 MG tablet Take 40 mg by mouth daily.       [name] enoxaparin (LOVENOX) 120 MG/0.8ML SOLN Inject 0.8 mLs (120 mg total) into the skin daily.  12.6 Syringe  5  . glyBURIDE-metformin (GLUCOVANCE) 2.5-500 MG per tablet Take 2 tablets by mouth 2 (two) times daily at 10 AM and 5 PM.       . hydrochlorothiazide  (MICROZIDE) 12.5 MG capsule Take 12.5 mg by mouth daily.        . metoprolol (TOPROL-XL) 50 MG 24 hr tablet Take 50 mg by mouth daily.        . trandolapril (MAVIK) 4 MG tablet Take 1 tablet (4 mg total) by mouth daily.  30 tablet  6  . warfarin (COUMADIN) 7.5 MG tablet Take 1 tablet (7.5 mg total) by mouth daily.  35 tablet  3    Allergies  Review of patient's allergies indicates no known allergies.  Electrocardiogram:  Assessment and Plan

## 2012-02-09 NOTE — Assessment & Plan Note (Signed)
S/P AVR normal exam F/U coumadin clinic

## 2012-02-09 NOTE — Assessment & Plan Note (Signed)
S/P aorto-left fem F/U Dr Early 2 months.  Resting claudication relieved.  Post op LLE edema likely from arterial reperfusion.  Venous duplex to R/O DVT and F/U arterial studies with Dr Arbie Cookey in 2 months

## 2012-02-22 ENCOUNTER — Encounter (INDEPENDENT_AMBULATORY_CARE_PROVIDER_SITE_OTHER): Payer: Medicare Other

## 2012-02-22 DIAGNOSIS — M7989 Other specified soft tissue disorders: Secondary | ICD-10-CM | POA: Diagnosis not present

## 2012-02-22 DIAGNOSIS — R609 Edema, unspecified: Secondary | ICD-10-CM

## 2012-03-24 DIAGNOSIS — E1159 Type 2 diabetes mellitus with other circulatory complications: Secondary | ICD-10-CM | POA: Diagnosis not present

## 2012-03-24 DIAGNOSIS — I739 Peripheral vascular disease, unspecified: Secondary | ICD-10-CM | POA: Diagnosis not present

## 2012-03-24 DIAGNOSIS — E785 Hyperlipidemia, unspecified: Secondary | ICD-10-CM | POA: Diagnosis not present

## 2012-03-24 DIAGNOSIS — I2510 Atherosclerotic heart disease of native coronary artery without angina pectoris: Secondary | ICD-10-CM | POA: Diagnosis not present

## 2012-03-28 ENCOUNTER — Encounter: Payer: Self-pay | Admitting: Vascular Surgery

## 2012-03-29 ENCOUNTER — Ambulatory Visit (INDEPENDENT_AMBULATORY_CARE_PROVIDER_SITE_OTHER): Payer: Medicare Other | Admitting: Vascular Surgery

## 2012-03-29 ENCOUNTER — Encounter: Payer: Self-pay | Admitting: Vascular Surgery

## 2012-03-29 VITALS — BP 159/58 | HR 75 | Temp 98.4°F | Ht 68.0 in | Wt 175.0 lb

## 2012-03-29 DIAGNOSIS — I739 Peripheral vascular disease, unspecified: Secondary | ICD-10-CM

## 2012-03-29 NOTE — Progress Notes (Signed)
The patient presents today for continued followup following aortobifemoral bypass on 01/18/2012. He has done quite well. He reports that he walks 2-3 miles per day and is an active exercise program with no difficulty. He does report some continued tingling in his left foot and does have some moderate left foot swelling which appears to be improving as well.  Physical exam: Abdominal and groin incisions well healed with no evidence of infection. He has 2+ femoral 2+ popliteal and palpable dorsalis pedis pulses bilaterally. He has no lower surety tissue loss.  Impression and plan: A full recovery from aortobifemoral bypass. The patient will continue his walking program. He will notify us if he develops any difficulty in the future. Otherwise he will be seen on an as-needed basis

## 2012-04-28 ENCOUNTER — Other Ambulatory Visit: Payer: Self-pay | Admitting: Cardiovascular Disease

## 2012-05-16 ENCOUNTER — Telehealth: Payer: Self-pay

## 2012-05-16 NOTE — Telephone Encounter (Signed)
Called pt refill request received from [facility] for Warfarin, pt was last seen in Coumadin Clinic on 01/27/12 and was scheduled to f/u in 1 week on 02/03/12 and No Showed for that follow-up appt and has not been seen since.  Pt became very argumentative and stated he had his Coumadin checked more recently than that.  I advised him of our records and he argued they were wrong.  Pt then made a rude comment and requested if he needed to make an appt then I should have just said so from the beginning.  Advised pt I called to schedule appt from the beginning making him aware he was past due by several months for follow-up.  Pt states he needed an appt for tomorrow as he will be out of Coumadin and commented unless I wanted to endanger his life.  I assured pt I was calling him to schedule Coumadin follow-up before I refilled his prescription because I was concerned about his safety in being unmonitored for the last 3+ months on Coumadin.  Appointment scheduled for 05/17/12 at 8:15am.

## 2012-05-17 ENCOUNTER — Ambulatory Visit (INDEPENDENT_AMBULATORY_CARE_PROVIDER_SITE_OTHER): Payer: Medicare Other | Admitting: Pharmacist

## 2012-05-17 DIAGNOSIS — G459 Transient cerebral ischemic attack, unspecified: Secondary | ICD-10-CM | POA: Diagnosis not present

## 2012-05-17 DIAGNOSIS — I359 Nonrheumatic aortic valve disorder, unspecified: Secondary | ICD-10-CM

## 2012-05-17 LAB — POCT INR: INR: 1.9

## 2012-05-17 MED ORDER — WARFARIN SODIUM 7.5 MG PO TABS: 7.5000 mg | ORAL_TABLET | Freq: Every day | ORAL | Status: DC

## 2012-06-14 ENCOUNTER — Ambulatory Visit (INDEPENDENT_AMBULATORY_CARE_PROVIDER_SITE_OTHER): Payer: Medicare Other | Admitting: Pharmacist

## 2012-06-14 DIAGNOSIS — G459 Transient cerebral ischemic attack, unspecified: Secondary | ICD-10-CM

## 2012-06-14 DIAGNOSIS — I359 Nonrheumatic aortic valve disorder, unspecified: Secondary | ICD-10-CM

## 2012-06-14 LAB — POCT INR: INR: 2.7

## 2012-06-15 ENCOUNTER — Other Ambulatory Visit: Payer: Self-pay | Admitting: *Deleted

## 2012-06-15 ENCOUNTER — Other Ambulatory Visit: Payer: Self-pay | Admitting: Pharmacist

## 2012-06-15 MED ORDER — WARFARIN SODIUM 7.5 MG PO TABS: 7.5000 mg | ORAL_TABLET | Freq: Every day | ORAL | Status: DC

## 2012-07-12 ENCOUNTER — Ambulatory Visit (INDEPENDENT_AMBULATORY_CARE_PROVIDER_SITE_OTHER): Payer: Medicare Other | Admitting: *Deleted

## 2012-07-12 DIAGNOSIS — G459 Transient cerebral ischemic attack, unspecified: Secondary | ICD-10-CM

## 2012-07-12 DIAGNOSIS — I359 Nonrheumatic aortic valve disorder, unspecified: Secondary | ICD-10-CM | POA: Diagnosis not present

## 2012-07-12 LAB — POCT INR: INR: 2.3

## 2012-07-29 ENCOUNTER — Other Ambulatory Visit: Payer: Self-pay | Admitting: Cardiovascular Disease

## 2012-08-09 ENCOUNTER — Ambulatory Visit (INDEPENDENT_AMBULATORY_CARE_PROVIDER_SITE_OTHER): Payer: Medicare Other | Admitting: *Deleted

## 2012-08-09 DIAGNOSIS — G459 Transient cerebral ischemic attack, unspecified: Secondary | ICD-10-CM

## 2012-08-09 DIAGNOSIS — I359 Nonrheumatic aortic valve disorder, unspecified: Secondary | ICD-10-CM | POA: Diagnosis not present

## 2012-08-09 LAB — POCT INR: INR: 2.1

## 2012-08-10 ENCOUNTER — Other Ambulatory Visit: Payer: Self-pay | Admitting: *Deleted

## 2012-08-10 MED ORDER — WARFARIN SODIUM 7.5 MG PO TABS: 7.5000 mg | ORAL_TABLET | Freq: Every day | ORAL | Status: DC

## 2012-08-25 ENCOUNTER — Other Ambulatory Visit: Payer: Self-pay | Admitting: Cardiovascular Disease

## 2012-08-25 DIAGNOSIS — I10 Essential (primary) hypertension: Secondary | ICD-10-CM

## 2012-08-25 MED ORDER — TRANDOLAPRIL 4 MG PO TABS: 4.0000 mg | ORAL_TABLET | Freq: Every day | ORAL | Status: DC

## 2012-10-14 ENCOUNTER — Ambulatory Visit (INDEPENDENT_AMBULATORY_CARE_PROVIDER_SITE_OTHER): Payer: Medicare Other | Admitting: *Deleted

## 2012-10-14 DIAGNOSIS — I359 Nonrheumatic aortic valve disorder, unspecified: Secondary | ICD-10-CM | POA: Diagnosis not present

## 2012-10-14 DIAGNOSIS — G459 Transient cerebral ischemic attack, unspecified: Secondary | ICD-10-CM | POA: Diagnosis not present

## 2012-10-14 LAB — POCT INR: INR: 1.7

## 2012-10-14 MED ORDER — WARFARIN SODIUM 7.5 MG PO TABS: 7.5000 mg | ORAL_TABLET | Freq: Every day | ORAL | Status: DC

## 2012-11-11 ENCOUNTER — Ambulatory Visit (INDEPENDENT_AMBULATORY_CARE_PROVIDER_SITE_OTHER): Payer: Medicare Other | Admitting: *Deleted

## 2012-11-11 DIAGNOSIS — I359 Nonrheumatic aortic valve disorder, unspecified: Secondary | ICD-10-CM

## 2012-11-11 DIAGNOSIS — Z23 Encounter for immunization: Secondary | ICD-10-CM | POA: Diagnosis not present

## 2012-11-11 DIAGNOSIS — G459 Transient cerebral ischemic attack, unspecified: Secondary | ICD-10-CM

## 2012-11-18 ENCOUNTER — Telehealth: Payer: Self-pay | Admitting: Cardiovascular Disease

## 2012-11-18 NOTE — Telephone Encounter (Signed)
New Problem: ° ° ° °I called the patient and was unable to reach them. I left a message on their voicemail with my name, the reason I called, the name of their physician, and a number to call back to schedule their appointment. ° °

## 2012-12-05 DIAGNOSIS — M436 Torticollis: Secondary | ICD-10-CM | POA: Diagnosis not present

## 2012-12-05 DIAGNOSIS — I2510 Atherosclerotic heart disease of native coronary artery without angina pectoris: Secondary | ICD-10-CM | POA: Diagnosis not present

## 2012-12-05 DIAGNOSIS — E785 Hyperlipidemia, unspecified: Secondary | ICD-10-CM | POA: Diagnosis not present

## 2012-12-05 DIAGNOSIS — E1159 Type 2 diabetes mellitus with other circulatory complications: Secondary | ICD-10-CM | POA: Diagnosis not present

## 2012-12-05 DIAGNOSIS — Z125 Encounter for screening for malignant neoplasm of prostate: Secondary | ICD-10-CM | POA: Diagnosis not present

## 2012-12-05 DIAGNOSIS — I10 Essential (primary) hypertension: Secondary | ICD-10-CM | POA: Diagnosis not present

## 2012-12-09 ENCOUNTER — Ambulatory Visit (INDEPENDENT_AMBULATORY_CARE_PROVIDER_SITE_OTHER): Payer: Medicare Other | Admitting: *Deleted

## 2012-12-09 DIAGNOSIS — G459 Transient cerebral ischemic attack, unspecified: Secondary | ICD-10-CM | POA: Diagnosis not present

## 2012-12-09 DIAGNOSIS — I359 Nonrheumatic aortic valve disorder, unspecified: Secondary | ICD-10-CM

## 2012-12-12 DIAGNOSIS — E1159 Type 2 diabetes mellitus with other circulatory complications: Secondary | ICD-10-CM | POA: Diagnosis not present

## 2012-12-12 DIAGNOSIS — M436 Torticollis: Secondary | ICD-10-CM | POA: Diagnosis not present

## 2012-12-12 DIAGNOSIS — Z125 Encounter for screening for malignant neoplasm of prostate: Secondary | ICD-10-CM | POA: Diagnosis not present

## 2012-12-12 DIAGNOSIS — Z0000 Encounter for general adult medical examination without abnormal findings: Secondary | ICD-10-CM | POA: Diagnosis not present

## 2012-12-16 ENCOUNTER — Encounter: Payer: Self-pay | Admitting: Cardiovascular Disease

## 2012-12-16 ENCOUNTER — Ambulatory Visit (INDEPENDENT_AMBULATORY_CARE_PROVIDER_SITE_OTHER): Payer: Medicare Other | Admitting: Cardiovascular Disease

## 2012-12-16 VITALS — BP 169/71 | HR 75 | Wt 185.0 lb

## 2012-12-16 DIAGNOSIS — I739 Peripheral vascular disease, unspecified: Secondary | ICD-10-CM | POA: Diagnosis not present

## 2012-12-16 DIAGNOSIS — I359 Nonrheumatic aortic valve disorder, unspecified: Secondary | ICD-10-CM

## 2012-12-16 DIAGNOSIS — E785 Hyperlipidemia, unspecified: Secondary | ICD-10-CM | POA: Diagnosis not present

## 2012-12-16 DIAGNOSIS — I2510 Atherosclerotic heart disease of native coronary artery without angina pectoris: Secondary | ICD-10-CM

## 2012-12-16 NOTE — Assessment & Plan Note (Signed)
Mild residual edema and paresthesia's needs F/U with Early and ABI's

## 2012-12-16 NOTE — Assessment & Plan Note (Signed)
S/P AVR with normal exam  SBE prophylaxis  F/U coumadin clinic INR;s Rx

## 2012-12-16 NOTE — Assessment & Plan Note (Signed)
Stable with no angina and good activity level.  Continue medical Rx  

## 2012-12-16 NOTE — Progress Notes (Signed)
Patient ID: [name] [name], male   DOB: 23-Jan-1946, 67 y.o.   MRN: [id number] S/P complex redo vascular surgery by Dr Arbie Cookey 2/13 . Previous left groin infection with vascular surgery. Resting claudication resolved but still with mild sensory neuropathy. S/P AVR on coumadin. No SSCP, fevers palpitations or dyspnea. Compliant with meds and coumadin followed at our clinic. LLE edema post op Venous duplex 3/18 normal with no DVT Doing well Now retired and golfing a lot  Needs f/u with Dr Arbie Cookey.  Recent labs with [name] [name] ok  ROS: Denies fever, malais, weight loss, blurry vision, decreased visual acuity, cough, sputum, SOB, hemoptysis, pleuritic pain, palpitaitons, heartburn, abdominal pain, melena, lower extremity edema, claudication, or rash.  All other systems reviewed and negative  General: Affect appropriate Healthy:  appears stated age HEENT: normal Neck supple with no adenopathy JVP normal no bruits no thyromegaly Lungs clear with no wheezing and good diaphragmatic motion Heart:  S1/S2 click SEM no AR  murmur, no rub, gallop or click PMI normal Abdomen: benighn, BS positve, no tenderness, no AAA no bruit.  No HSM or HJR Distal pulses intact with bilateral  bruits left PT/DP harder to feel No edema Neuro non-focal Skin warm and dry No muscular weakness   Current Outpatient Prescriptions  Medication Sig Dispense Refill  . amLODipine (NORVASC) 5 MG tablet Take 10 mg by mouth daily.       [name] amoxicillin (AMOXIL) 500 MG capsule Take 500 mg by mouth once. Taken prior to dental visits      . atorvastatin (LIPITOR) 40 MG tablet Take 40 mg by mouth daily.       [name] enoxaparin (LOVENOX) 120 MG/0.8ML injection Inject 120 mg into the skin as directed.      . glyBURIDE (DIABETA) 2.5 MG tablet Take 5 mg by mouth 2 (two) times daily with a meal.      . hydrochlorothiazide (MICROZIDE) 12.5 MG capsule Take 12.5 mg by mouth daily.        . metFORMIN (GLUCOPHAGE) 500 MG tablet Take 1,000 mg by mouth 2  (two) times daily with a meal.       . metoprolol (TOPROL-XL) 50 MG 24 hr tablet Take 50 mg by mouth daily.        . trandolapril (MAVIK) 4 MG tablet Take 1 tablet (4 mg total) by mouth daily.  30 tablet  6  . warfarin (COUMADIN) 7.5 MG tablet Take 1 tablet (7.5 mg total) by mouth daily.  30 tablet  2    Allergies  Review of patient's allergies indicates no known allergies.  Electrocardiogram:  NSR rate 58 normal ECG Assessment and Plan

## 2012-12-16 NOTE — Patient Instructions (Addendum)
**Note De-identified [name] Obfuscation** Your physician recommends that you continue on your current medications as directed. Please refer to the Current Medication list given to you today.  Your physician wants you to follow-up in: 1 year. You will receive a reminder letter in the mail two months in advance. If you don't receive a letter, please call our office to schedule the follow-up appointment.  

## 2012-12-16 NOTE — Assessment & Plan Note (Signed)
Cholesterol is at goal.  Continue current dose of statin and diet Rx.  No myalgias or side effects.  F/U  LFT's in 6 months. No results found for this basename: [facility]  labs with Dr Timothy Lasso

## 2012-12-19 DIAGNOSIS — M999 Biomechanical lesion, unspecified: Secondary | ICD-10-CM | POA: Diagnosis not present

## 2012-12-19 DIAGNOSIS — M5030 Other cervical disc degeneration, unspecified cervical region: Secondary | ICD-10-CM | POA: Diagnosis not present

## 2012-12-19 DIAGNOSIS — M9981 Other biomechanical lesions of cervical region: Secondary | ICD-10-CM | POA: Diagnosis not present

## 2012-12-19 DIAGNOSIS — IMO0002 Reserved for concepts with insufficient information to code with codable children: Secondary | ICD-10-CM | POA: Diagnosis not present

## 2012-12-20 ENCOUNTER — Telehealth: Payer: Self-pay | Admitting: Vascular Surgery

## 2012-12-20 DIAGNOSIS — M9981 Other biomechanical lesions of cervical region: Secondary | ICD-10-CM | POA: Diagnosis not present

## 2012-12-20 DIAGNOSIS — IMO0002 Reserved for concepts with insufficient information to code with codable children: Secondary | ICD-10-CM | POA: Diagnosis not present

## 2012-12-20 DIAGNOSIS — M999 Biomechanical lesion, unspecified: Secondary | ICD-10-CM | POA: Diagnosis not present

## 2012-12-20 DIAGNOSIS — M5030 Other cervical disc degeneration, unspecified cervical region: Secondary | ICD-10-CM | POA: Diagnosis not present

## 2012-12-20 NOTE — Telephone Encounter (Signed)
notified patient of fu appt. with tfe on 01-10-13 at 10:45 as per staff message 12-20-12

## 2012-12-21 DIAGNOSIS — M5030 Other cervical disc degeneration, unspecified cervical region: Secondary | ICD-10-CM | POA: Diagnosis not present

## 2012-12-21 DIAGNOSIS — IMO0002 Reserved for concepts with insufficient information to code with codable children: Secondary | ICD-10-CM | POA: Diagnosis not present

## 2012-12-21 DIAGNOSIS — M9981 Other biomechanical lesions of cervical region: Secondary | ICD-10-CM | POA: Diagnosis not present

## 2012-12-21 DIAGNOSIS — M999 Biomechanical lesion, unspecified: Secondary | ICD-10-CM | POA: Diagnosis not present

## 2012-12-23 DIAGNOSIS — M5030 Other cervical disc degeneration, unspecified cervical region: Secondary | ICD-10-CM | POA: Diagnosis not present

## 2012-12-23 DIAGNOSIS — M999 Biomechanical lesion, unspecified: Secondary | ICD-10-CM | POA: Diagnosis not present

## 2012-12-23 DIAGNOSIS — M9981 Other biomechanical lesions of cervical region: Secondary | ICD-10-CM | POA: Diagnosis not present

## 2012-12-23 DIAGNOSIS — IMO0002 Reserved for concepts with insufficient information to code with codable children: Secondary | ICD-10-CM | POA: Diagnosis not present

## 2012-12-26 DIAGNOSIS — IMO0002 Reserved for concepts with insufficient information to code with codable children: Secondary | ICD-10-CM | POA: Diagnosis not present

## 2012-12-26 DIAGNOSIS — M5030 Other cervical disc degeneration, unspecified cervical region: Secondary | ICD-10-CM | POA: Diagnosis not present

## 2012-12-26 DIAGNOSIS — M999 Biomechanical lesion, unspecified: Secondary | ICD-10-CM | POA: Diagnosis not present

## 2012-12-26 DIAGNOSIS — M9981 Other biomechanical lesions of cervical region: Secondary | ICD-10-CM | POA: Diagnosis not present

## 2012-12-28 DIAGNOSIS — M999 Biomechanical lesion, unspecified: Secondary | ICD-10-CM | POA: Diagnosis not present

## 2012-12-28 DIAGNOSIS — IMO0002 Reserved for concepts with insufficient information to code with codable children: Secondary | ICD-10-CM | POA: Diagnosis not present

## 2012-12-28 DIAGNOSIS — M9981 Other biomechanical lesions of cervical region: Secondary | ICD-10-CM | POA: Diagnosis not present

## 2012-12-28 DIAGNOSIS — M5030 Other cervical disc degeneration, unspecified cervical region: Secondary | ICD-10-CM | POA: Diagnosis not present

## 2012-12-29 DIAGNOSIS — M999 Biomechanical lesion, unspecified: Secondary | ICD-10-CM | POA: Diagnosis not present

## 2012-12-29 DIAGNOSIS — IMO0002 Reserved for concepts with insufficient information to code with codable children: Secondary | ICD-10-CM | POA: Diagnosis not present

## 2012-12-29 DIAGNOSIS — M9981 Other biomechanical lesions of cervical region: Secondary | ICD-10-CM | POA: Diagnosis not present

## 2012-12-29 DIAGNOSIS — M5030 Other cervical disc degeneration, unspecified cervical region: Secondary | ICD-10-CM | POA: Diagnosis not present

## 2013-01-02 DIAGNOSIS — M5030 Other cervical disc degeneration, unspecified cervical region: Secondary | ICD-10-CM | POA: Diagnosis not present

## 2013-01-02 DIAGNOSIS — IMO0002 Reserved for concepts with insufficient information to code with codable children: Secondary | ICD-10-CM | POA: Diagnosis not present

## 2013-01-02 DIAGNOSIS — M999 Biomechanical lesion, unspecified: Secondary | ICD-10-CM | POA: Diagnosis not present

## 2013-01-02 DIAGNOSIS — M9981 Other biomechanical lesions of cervical region: Secondary | ICD-10-CM | POA: Diagnosis not present

## 2013-01-03 DIAGNOSIS — M9981 Other biomechanical lesions of cervical region: Secondary | ICD-10-CM | POA: Diagnosis not present

## 2013-01-03 DIAGNOSIS — M999 Biomechanical lesion, unspecified: Secondary | ICD-10-CM | POA: Diagnosis not present

## 2013-01-03 DIAGNOSIS — M5030 Other cervical disc degeneration, unspecified cervical region: Secondary | ICD-10-CM | POA: Diagnosis not present

## 2013-01-03 DIAGNOSIS — IMO0002 Reserved for concepts with insufficient information to code with codable children: Secondary | ICD-10-CM | POA: Diagnosis not present

## 2013-01-05 DIAGNOSIS — IMO0002 Reserved for concepts with insufficient information to code with codable children: Secondary | ICD-10-CM | POA: Diagnosis not present

## 2013-01-05 DIAGNOSIS — M999 Biomechanical lesion, unspecified: Secondary | ICD-10-CM | POA: Diagnosis not present

## 2013-01-05 DIAGNOSIS — M5030 Other cervical disc degeneration, unspecified cervical region: Secondary | ICD-10-CM | POA: Diagnosis not present

## 2013-01-05 DIAGNOSIS — M9981 Other biomechanical lesions of cervical region: Secondary | ICD-10-CM | POA: Diagnosis not present

## 2013-01-09 ENCOUNTER — Encounter: Payer: Self-pay | Admitting: Vascular Surgery

## 2013-01-09 DIAGNOSIS — M9981 Other biomechanical lesions of cervical region: Secondary | ICD-10-CM | POA: Diagnosis not present

## 2013-01-09 DIAGNOSIS — M5030 Other cervical disc degeneration, unspecified cervical region: Secondary | ICD-10-CM | POA: Diagnosis not present

## 2013-01-09 DIAGNOSIS — M999 Biomechanical lesion, unspecified: Secondary | ICD-10-CM | POA: Diagnosis not present

## 2013-01-09 DIAGNOSIS — IMO0002 Reserved for concepts with insufficient information to code with codable children: Secondary | ICD-10-CM | POA: Diagnosis not present

## 2013-01-10 ENCOUNTER — Ambulatory Visit (INDEPENDENT_AMBULATORY_CARE_PROVIDER_SITE_OTHER): Payer: Medicare Other | Admitting: Vascular Surgery

## 2013-01-10 ENCOUNTER — Encounter: Payer: Self-pay | Admitting: Vascular Surgery

## 2013-01-10 VITALS — BP 154/55 | HR 63 | Resp 18 | Ht 68.0 in | Wt 183.0 lb

## 2013-01-10 DIAGNOSIS — I7409 Other arterial embolism and thrombosis of abdominal aorta: Secondary | ICD-10-CM | POA: Insufficient documentation

## 2013-01-10 DIAGNOSIS — M999 Biomechanical lesion, unspecified: Secondary | ICD-10-CM | POA: Diagnosis not present

## 2013-01-10 DIAGNOSIS — M5030 Other cervical disc degeneration, unspecified cervical region: Secondary | ICD-10-CM | POA: Diagnosis not present

## 2013-01-10 DIAGNOSIS — M9981 Other biomechanical lesions of cervical region: Secondary | ICD-10-CM | POA: Diagnosis not present

## 2013-01-10 DIAGNOSIS — IMO0002 Reserved for concepts with insufficient information to code with codable children: Secondary | ICD-10-CM | POA: Diagnosis not present

## 2013-01-10 NOTE — Progress Notes (Signed)
Patient has today for followup of his lower surety arterial insufficiency. He had several attempts at revascularization to his ending and left femoral endarterectomy. He had continued difficulty with this and subsequently underwent aortobifemoral bypass by myself on 01/18/2012. He did well from this. He did have critical limb ischemia with neurologic deficit associated with this prior to surgery. He did well in recovery and has continued to do well. He reports that during the warm weather he plays golf 5 days a week. He does walk with a treadmill and reports that he is very aggressive with this he can have some mild right calf claudication but this is not limiting to him. He has no symptoms suggestive of incisional hernia. He reports is been stable from the standpoint of his cardiac disease with aortic stenosis. He does report some persistent numbness in his left foot which is not limiting to him. He also reports an occasional stinging shooting pain to his groins bilaterally which are not persistent.  Past Medical History  Diagnosis Date  . Diabetes mellitus   . Hypertension   . Hyperlipidemia   . ITP (idiopathic thrombocytopenic purpura)   . Coronary artery disease     s/p cabg x 3 11/2003: lima-lad, seq vg to rpda and rpl  . Aortic stenosis     s/p [facility] mechanical AVR - Chronic Coumadin  . Claudication   . Diverticulitis of colon   . Hx of colonic polyps   . Peripheral vascular disease     s/p Left external Iliac Artery stenting and subsequent left femoral endarterectomy 02/2011- post op course complicated by wound infxn req I&D 03/2011  . Bronchitis 12/17/11    "just gettin over a case"  . Anemia   . Blood transfusion     History  Substance Use Topics  . Smoking status: Former Smoker -- 1.5 packs/day for 30 years    Types: Cigarettes    Quit date: 12/15/1993  . Smokeless tobacco: Never Used  . Alcohol Use: 7.0 oz/week    14 drink(s) per week     Comment: drinks 2 martini's a night     Family History  Problem Relation Age of Onset  . Coronary artery disease Mother     bypass surgery - deceased  . Heart disease Father     murmur, valve replacement - deceased  . Colon cancer Neg Hx   . Breast cancer Sister   . Diabetes      grandmother    No Known Allergies  Current outpatient prescriptions:amLODipine (NORVASC) 5 MG tablet, Take 10 mg by mouth daily. , Disp: , Rfl: ;  amoxicillin (AMOXIL) 500 MG capsule, Take 500 mg by mouth once. Taken prior to dental visits, Disp: , Rfl: ;  atorvastatin (LIPITOR) 40 MG tablet, Take 40 mg by mouth daily. , Disp: , Rfl: ;  glyBURIDE (DIABETA) 2.5 MG tablet, Take 5 mg by mouth 2 (two) times daily with a meal., Disp: , Rfl:  hydrochlorothiazide (MICROZIDE) 12.5 MG capsule, Take 12.5 mg by mouth daily.  , Disp: , Rfl: ;  metFORMIN (GLUCOPHAGE) 500 MG tablet, Take 1,000 mg by mouth 2 (two) times daily with a meal. , Disp: , Rfl: ;  metoprolol (TOPROL-XL) 50 MG 24 hr tablet, Take 50 mg by mouth daily.  , Disp: , Rfl: ;  trandolapril (MAVIK) 4 MG tablet, Take 1 tablet (4 mg total) by mouth daily., Disp: 30 tablet, Rfl: 6 warfarin (COUMADIN) 7.5 MG tablet, Take 1 tablet (7.5 mg total)  by mouth daily., Disp: 30 tablet, Rfl: 2  BP 154/55  Pulse 63  Resp 18  Ht 5\' 8"  (1.727 m)  Wt 183 lb (83.008 kg)  BMI 27.82 kg/m2  Body mass index is 27.82 kg/(m^2).       Physical exam well-developed well-nourished white male no acute distress Pulse status 2+ radial 2+ femoral 2+ popliteal and a 2+ left posterior tibial and dorsalis pedis pulse he does have a 1-2+ right dorsalis pedis and posterior tibial pulse Abdomen soft nontender no masses noted. He has a well-healed midline incision with no hernia Bilateral groin incisions are well-healed with no evidence of false aneurysm Neurologically he is grossly intact  Impression and plan: Stable one year status post aortobifemoral bypass for aortoiliac occlusive disease. He will continue his walking  and exercise program. He will see [place] again on an as-needed basis. He understands symptoms of critical limb ischemia and claudication no notify should he have progression

## 2013-01-11 DIAGNOSIS — H2510 Age-related nuclear cataract, unspecified eye: Secondary | ICD-10-CM | POA: Diagnosis not present

## 2013-01-11 DIAGNOSIS — E119 Type 2 diabetes mellitus without complications: Secondary | ICD-10-CM | POA: Diagnosis not present

## 2013-01-11 DIAGNOSIS — H26109 Unspecified traumatic cataract, unspecified eye: Secondary | ICD-10-CM | POA: Diagnosis not present

## 2013-01-12 DIAGNOSIS — M5030 Other cervical disc degeneration, unspecified cervical region: Secondary | ICD-10-CM | POA: Diagnosis not present

## 2013-01-12 DIAGNOSIS — M9981 Other biomechanical lesions of cervical region: Secondary | ICD-10-CM | POA: Diagnosis not present

## 2013-01-12 DIAGNOSIS — IMO0002 Reserved for concepts with insufficient information to code with codable children: Secondary | ICD-10-CM | POA: Diagnosis not present

## 2013-01-12 DIAGNOSIS — M999 Biomechanical lesion, unspecified: Secondary | ICD-10-CM | POA: Diagnosis not present

## 2013-01-16 DIAGNOSIS — M9981 Other biomechanical lesions of cervical region: Secondary | ICD-10-CM | POA: Diagnosis not present

## 2013-01-16 DIAGNOSIS — M5030 Other cervical disc degeneration, unspecified cervical region: Secondary | ICD-10-CM | POA: Diagnosis not present

## 2013-01-16 DIAGNOSIS — IMO0002 Reserved for concepts with insufficient information to code with codable children: Secondary | ICD-10-CM | POA: Diagnosis not present

## 2013-01-16 DIAGNOSIS — M999 Biomechanical lesion, unspecified: Secondary | ICD-10-CM | POA: Diagnosis not present

## 2013-01-17 ENCOUNTER — Other Ambulatory Visit: Payer: Self-pay | Admitting: *Deleted

## 2013-01-17 MED ORDER — WARFARIN SODIUM 7.5 MG PO TABS: 7.5000 mg | ORAL_TABLET | Freq: Every day | ORAL | Status: DC

## 2013-01-23 ENCOUNTER — Other Ambulatory Visit: Payer: Self-pay

## 2013-01-23 DIAGNOSIS — IMO0002 Reserved for concepts with insufficient information to code with codable children: Secondary | ICD-10-CM | POA: Diagnosis not present

## 2013-01-23 DIAGNOSIS — M9981 Other biomechanical lesions of cervical region: Secondary | ICD-10-CM | POA: Diagnosis not present

## 2013-01-23 DIAGNOSIS — M5030 Other cervical disc degeneration, unspecified cervical region: Secondary | ICD-10-CM | POA: Diagnosis not present

## 2013-01-23 DIAGNOSIS — M999 Biomechanical lesion, unspecified: Secondary | ICD-10-CM | POA: Diagnosis not present

## 2013-01-23 MED ORDER — WARFARIN SODIUM 7.5 MG PO TABS: 7.5000 mg | ORAL_TABLET | Freq: Every day | ORAL | Status: DC

## 2013-01-24 ENCOUNTER — Ambulatory Visit (INDEPENDENT_AMBULATORY_CARE_PROVIDER_SITE_OTHER): Payer: Medicare Other | Admitting: *Deleted

## 2013-01-24 DIAGNOSIS — I359 Nonrheumatic aortic valve disorder, unspecified: Secondary | ICD-10-CM

## 2013-01-24 DIAGNOSIS — G459 Transient cerebral ischemic attack, unspecified: Secondary | ICD-10-CM

## 2013-01-24 MED ORDER — WARFARIN SODIUM 7.5 MG PO TABS: 7.5000 mg | ORAL_TABLET | ORAL | Status: DC

## 2013-01-26 DIAGNOSIS — M999 Biomechanical lesion, unspecified: Secondary | ICD-10-CM | POA: Diagnosis not present

## 2013-01-26 DIAGNOSIS — M5030 Other cervical disc degeneration, unspecified cervical region: Secondary | ICD-10-CM | POA: Diagnosis not present

## 2013-01-26 DIAGNOSIS — M9981 Other biomechanical lesions of cervical region: Secondary | ICD-10-CM | POA: Diagnosis not present

## 2013-01-26 DIAGNOSIS — IMO0002 Reserved for concepts with insufficient information to code with codable children: Secondary | ICD-10-CM | POA: Diagnosis not present

## 2013-02-07 DIAGNOSIS — IMO0002 Reserved for concepts with insufficient information to code with codable children: Secondary | ICD-10-CM | POA: Diagnosis not present

## 2013-02-07 DIAGNOSIS — M999 Biomechanical lesion, unspecified: Secondary | ICD-10-CM | POA: Diagnosis not present

## 2013-02-07 DIAGNOSIS — M5030 Other cervical disc degeneration, unspecified cervical region: Secondary | ICD-10-CM | POA: Diagnosis not present

## 2013-02-07 DIAGNOSIS — M9981 Other biomechanical lesions of cervical region: Secondary | ICD-10-CM | POA: Diagnosis not present

## 2013-04-05 ENCOUNTER — Other Ambulatory Visit: Payer: Self-pay | Admitting: *Deleted

## 2013-04-05 DIAGNOSIS — I10 Essential (primary) hypertension: Secondary | ICD-10-CM

## 2013-04-05 MED ORDER — TRANDOLAPRIL 4 MG PO TABS: 4.0000 mg | ORAL_TABLET | Freq: Every day | ORAL | Status: DC

## 2013-05-05 ENCOUNTER — Encounter (HOSPITAL_COMMUNITY): Payer: Self-pay | Admitting: Emergency Medicine

## 2013-05-05 ENCOUNTER — Emergency Department (HOSPITAL_COMMUNITY): Payer: Medicare Other

## 2013-05-05 ENCOUNTER — Inpatient Hospital Stay (HOSPITAL_COMMUNITY)
Admission: EM | Admit: 2013-05-05 | Discharge: 2013-05-08 | DRG: 066 | Disposition: A | Discharge status: Home / Self Care | Payer: Medicare Other | Attending: Internal Medicine | Admitting: Internal Medicine

## 2013-05-05 DIAGNOSIS — Z860100 Personal history of colon polyps, unspecified: Secondary | ICD-10-CM

## 2013-05-05 DIAGNOSIS — Z8249 Family history of ischemic heart disease and other diseases of the circulatory system: Secondary | ICD-10-CM

## 2013-05-05 DIAGNOSIS — E785 Hyperlipidemia, unspecified: Secondary | ICD-10-CM | POA: Diagnosis present

## 2013-05-05 DIAGNOSIS — I739 Peripheral vascular disease, unspecified: Secondary | ICD-10-CM | POA: Diagnosis not present

## 2013-05-05 DIAGNOSIS — I359 Nonrheumatic aortic valve disorder, unspecified: Secondary | ICD-10-CM | POA: Diagnosis not present

## 2013-05-05 DIAGNOSIS — R471 Dysarthria and anarthria: Secondary | ICD-10-CM | POA: Diagnosis present

## 2013-05-05 DIAGNOSIS — I6340 Cerebral infarction due to embolism of unspecified cerebral artery: Principal | ICD-10-CM | POA: Diagnosis present

## 2013-05-05 DIAGNOSIS — Z803 Family history of malignant neoplasm of breast: Secondary | ICD-10-CM | POA: Diagnosis not present

## 2013-05-05 DIAGNOSIS — I2510 Atherosclerotic heart disease of native coronary artery without angina pectoris: Secondary | ICD-10-CM | POA: Diagnosis present

## 2013-05-05 DIAGNOSIS — Z7901 Long term (current) use of anticoagulants: Secondary | ICD-10-CM

## 2013-05-05 DIAGNOSIS — Z951 Presence of aortocoronary bypass graft: Secondary | ICD-10-CM | POA: Diagnosis not present

## 2013-05-05 DIAGNOSIS — E7800 Pure hypercholesterolemia, unspecified: Secondary | ICD-10-CM | POA: Diagnosis present

## 2013-05-05 DIAGNOSIS — Z9089 Acquired absence of other organs: Secondary | ICD-10-CM | POA: Diagnosis not present

## 2013-05-05 DIAGNOSIS — I7409 Other arterial embolism and thrombosis of abdominal aorta: Secondary | ICD-10-CM | POA: Diagnosis not present

## 2013-05-05 DIAGNOSIS — I6529 Occlusion and stenosis of unspecified carotid artery: Secondary | ICD-10-CM | POA: Diagnosis not present

## 2013-05-05 DIAGNOSIS — I672 Cerebral atherosclerosis: Secondary | ICD-10-CM | POA: Diagnosis present

## 2013-05-05 DIAGNOSIS — IMO0001 Reserved for inherently not codable concepts without codable children: Secondary | ICD-10-CM | POA: Diagnosis not present

## 2013-05-05 DIAGNOSIS — I6350 Cerebral infarction due to unspecified occlusion or stenosis of unspecified cerebral artery: Secondary | ICD-10-CM

## 2013-05-05 DIAGNOSIS — Z87891 Personal history of nicotine dependence: Secondary | ICD-10-CM

## 2013-05-05 DIAGNOSIS — R4789 Other speech disturbances: Secondary | ICD-10-CM | POA: Diagnosis not present

## 2013-05-05 DIAGNOSIS — Z9861 Coronary angioplasty status: Secondary | ICD-10-CM | POA: Diagnosis not present

## 2013-05-05 DIAGNOSIS — I10 Essential (primary) hypertension: Secondary | ICD-10-CM | POA: Diagnosis present

## 2013-05-05 DIAGNOSIS — Z833 Family history of diabetes mellitus: Secondary | ICD-10-CM | POA: Diagnosis not present

## 2013-05-05 DIAGNOSIS — R29810 Facial weakness: Secondary | ICD-10-CM | POA: Diagnosis not present

## 2013-05-05 DIAGNOSIS — E119 Type 2 diabetes mellitus without complications: Secondary | ICD-10-CM | POA: Diagnosis not present

## 2013-05-05 DIAGNOSIS — Z954 Presence of other heart-valve replacement: Secondary | ICD-10-CM | POA: Diagnosis not present

## 2013-05-05 DIAGNOSIS — I6509 Occlusion and stenosis of unspecified vertebral artery: Secondary | ICD-10-CM | POA: Diagnosis not present

## 2013-05-05 DIAGNOSIS — I70209 Unspecified atherosclerosis of native arteries of extremities, unspecified extremity: Secondary | ICD-10-CM | POA: Diagnosis present

## 2013-05-05 DIAGNOSIS — R4701 Aphasia: Secondary | ICD-10-CM | POA: Diagnosis present

## 2013-05-05 DIAGNOSIS — I639 Cerebral infarction, unspecified: Secondary | ICD-10-CM | POA: Diagnosis present

## 2013-05-05 DIAGNOSIS — I6789 Other cerebrovascular disease: Secondary | ICD-10-CM | POA: Diagnosis not present

## 2013-05-05 DIAGNOSIS — I350 Nonrheumatic aortic (valve) stenosis: Secondary | ICD-10-CM | POA: Diagnosis present

## 2013-05-05 DIAGNOSIS — R918 Other nonspecific abnormal finding of lung field: Secondary | ICD-10-CM | POA: Diagnosis not present

## 2013-05-05 LAB — DIFFERENTIAL
Basophils Absolute: 0 10*3/uL (ref 0.0–0.1)
Eosinophils Relative: 1 % (ref 0–5)
Lymphocytes Relative: 19 % (ref 12–46)
Lymphs Abs: 1.6 10*3/uL (ref 0.7–4.0)
Monocytes Absolute: 0.8 10*3/uL (ref 0.1–1.0)
Neutro Abs: 5.7 10*3/uL (ref 1.7–7.7)

## 2013-05-05 LAB — COMPREHENSIVE METABOLIC PANEL
ALT: 20 U/L (ref 0–53)
AST: 25 U/L (ref 0–37)
CO2: 28 mEq/L (ref 19–32)
Calcium: 9.3 mg/dL (ref 8.4–10.5)
Chloride: 100 mEq/L (ref 96–112)
Creatinine, Ser: 0.7 mg/dL (ref 0.50–1.35)
GFR calc Af Amer: >90 mL/min (ref >90)
GFR calc non Af Amer: >90 mL/min (ref >90)
Glucose, Bld: 172 mg/dL — ABNORMAL HIGH (ref 70–99)
Total Bilirubin: 0.5 mg/dL (ref 0.3–1.2)

## 2013-05-05 LAB — CBC
HCT: 42.8 % (ref 39.0–52.0)
Hemoglobin: 15.3 g/dL (ref 13.0–17.0)
MCV: 91.5 fL (ref 78.0–100.0)
RBC: 4.68 MIL/uL (ref 4.22–5.81)
RDW: 14 % (ref 11.5–15.5)
WBC: 8.2 10*3/uL (ref 4.0–10.5)

## 2013-05-05 LAB — POCT I-STAT, CHEM 8
BUN: 12 mg/dL (ref 6–23)
Creatinine, Ser: 0.8 mg/dL (ref 0.50–1.35)
Hemoglobin: 16 g/dL (ref 13.0–17.0)
Potassium: 3.7 mEq/L (ref 3.5–5.1)
Sodium: 139 mEq/L (ref 135–145)

## 2013-05-05 LAB — APTT: aPTT: 33 seconds (ref 24–37)

## 2013-05-05 LAB — GLUCOSE, CAPILLARY: Glucose-Capillary: 198 mg/dL — ABNORMAL HIGH (ref 70–99)

## 2013-05-05 MED ORDER — ATORVASTATIN CALCIUM 40 MG PO TABS
40.0000 mg | ORAL_TABLET | Freq: Every day | ORAL | Status: DC
  Administered 2013-05-06 – 2013-05-07 (×2): 40 mg via ORAL
  Filled 2013-05-05 (×3): qty 1

## 2013-05-05 MED ORDER — ONDANSETRON HCL 4 MG/2ML IJ SOLN: 4.0000 mg | Freq: Three times a day (TID) | INTRAMUSCULAR | Status: AC | PRN

## 2013-05-05 MED ORDER — LABETALOL HCL 5 MG/ML IV SOLN
INTRAVENOUS | Status: AC
  Filled 2013-05-05: qty 4

## 2013-05-05 MED ORDER — ALTEPLASE (STROKE) FULL DOSE INFUSION
0.9000 mg/kg | Freq: Once | INTRAVENOUS | Status: DC
  Filled 2013-05-05: qty 75

## 2013-05-05 MED ORDER — ALTEPLASE (STROKE) FULL DOSE INFUSION: 0.9000 mg/kg | Freq: Once | INTRAVENOUS | Status: DC

## 2013-05-05 MED ORDER — WARFARIN - PHARMACIST DOSING INPATIENT: Freq: Every day | Status: DC

## 2013-05-05 MED ORDER — WARFARIN SODIUM 5 MG PO TABS
5.0000 mg | ORAL_TABLET | Freq: Once | ORAL | Status: AC
  Administered 2013-05-05: 5 mg via ORAL
  Filled 2013-05-05: qty 1

## 2013-05-05 MED ORDER — GADOBENATE DIMEGLUMINE 529 MG/ML IV SOLN
20.0000 mL | Freq: Once | INTRAVENOUS | Status: AC | PRN
  Administered 2013-05-05: 20 mL via INTRAVENOUS

## 2013-05-05 MED ORDER — METOPROLOL SUCCINATE ER 50 MG PO TB24
50.0000 mg | ORAL_TABLET | Freq: Every day | ORAL | Status: DC
  Administered 2013-05-06 – 2013-05-08 (×3): 50 mg via ORAL
  Filled 2013-05-05 (×3): qty 1

## 2013-05-05 MED ORDER — SENNOSIDES-DOCUSATE SODIUM 8.6-50 MG PO TABS: 1.0000 | ORAL_TABLET | Freq: Every evening | ORAL | Status: DC | PRN

## 2013-05-05 MED ORDER — HEPARIN (PORCINE) IN NACL 100-0.45 UNIT/ML-% IJ SOLN
1650.0000 [IU]/h | INTRAMUSCULAR | Status: DC
  Administered 2013-05-05: 1000 [IU]/h via INTRAVENOUS
  Administered 2013-05-06: 1300 [IU]/h via INTRAVENOUS
  Administered 2013-05-06 – 2013-05-07 (×2): 1500 [IU]/h via INTRAVENOUS
  Administered 2013-05-08: 1650 [IU]/h via INTRAVENOUS
  Filled 2013-05-05 (×6): qty 250

## 2013-05-05 MED ORDER — SODIUM CHLORIDE 0.9 % IV SOLN: 250.0000 mL | Freq: Once | INTRAVENOUS | Status: DC

## 2013-05-05 NOTE — ED Notes (Signed)
Pt experiencing right sided facial droop along with slurred speech and difficulty expressing words. Pt a&Ox4. Pt in nad, skin warm and dry, resp e/u. Dr. Patria Mane notified.

## 2013-05-05 NOTE — ED Notes (Signed)
Pt's speech is improving. 

## 2013-05-05 NOTE — ED Notes (Signed)
Family member reporting LSN [id number]

## 2013-05-05 NOTE — Code Documentation (Signed)
67 year old male who presented to [facility] via [facility] with slurred speech and right facial droop.  Arrived to ED at 1417.  EDP  Exam at 1425.  Code stroke called at 1429.   Stroke team arrival at 1437.  Neurologist arrival at 1437. Patient to CT at 1430.  Patient states he was playing golf today - did not feel good for last half golf - while on 18th green noticed that he had trouble putting the ball on the tee - right hand seemed to not function correctly.  He finished the game - drove himself home - about 20-30 minute drive - when he got home he was having significant speech issues - states he used his cell phone to call a friend but could not talk - she called 911 and he was transported to hospital.  On arrival speech was improving - NIHSS was 4 - mild dysarthria, mild sensation loss right side, mild right facial droop, and ataxia - mild.  Patient is on Coumadin for mechanical aortic heart valve.  States he has not had INR check in 2 months.  In SR presently.  INR 1.52 today.  Considered for PRISM trial.  Research nurse Wes present speaking with patient when his symptoms worsened with aphasia. tPA considered but PTT and PT outside parameters for tpa administration.  Patient taken to MRI - speech better but still with difficult word finding and dysarthria.  Post MRI - speech clear and fluent - NIHSS repeated - 2 - mild facial droop - mild sensation loss right.  Handoff report to [employer].

## 2013-05-05 NOTE — ED Notes (Signed)
Pt returned to room  

## 2013-05-05 NOTE — ED Notes (Signed)
Pt transported to MRI with this RN and Eunice Blase, RN with rapid response.

## 2013-05-05 NOTE — ED Notes (Signed)
Lab at bedside

## 2013-05-05 NOTE — ED Notes (Signed)
Per EMS - pt was playing golf when he started to not feel so good at 145pm, drove home and called EMS. Pt has slurred speech and right sided facial droop upon EMS arrival. Facial droop has cleared up at this time. Pt denies HA/CP. Equal hand grips, no drift. BP 149/68 HR 71 RR 18 96% on room air. CBG 168. EMS started a 20G in right hand. NSR on monitor.

## 2013-05-05 NOTE — H&P (Signed)
[facility] History and Physical  [name] [name] AVW:[id number] DOB: 1946-05-13 DOA: 05/05/2013  Referring physician: dr Radford [name] PCP: [name] Pounds, MD  Specialists: [name] [name] [name]  Chief Complaint: rigth facial droop and slurred speech.   HPI: [name] [name] is a 67 y.o. male with AVR, hypertension , DM, came in for slurred speech and right facial droop. He was called in as code stroke, but as his symptoms were improving, tpa was not given. His initial CT was not significant. His MRI Brain showed punctate infarcts int he left frontal lobe. Neurology was consulted in ED, recommended admission to medical service and stroke work up.   Review of Systems: The patient denies anorexia, fever, weight loss,, vision loss, decreased hearing, hoarseness, chest pain, syncope, dyspnea on exertion, peripheral edema,  hemoptysis, abdominal pain, melena, hematochezia, severe indigestion/heartburn, hematuria, incontinence, genital sores, muscle weakness, suspicious skin lesions, transient blindness, difficulty walking, depression, unusual weight change, abnormal bleeding, enlarged lymph nodes, angioedema, and breast masses.    Past Medical History  Diagnosis Date  . Diabetes mellitus   . Hypertension   . Hyperlipidemia   . ITP (idiopathic thrombocytopenic purpura)   . Coronary artery disease     s/p cabg x 3 11/2003: lima-lad, seq vg to rpda and rpl  . Aortic stenosis     s/p [facility] mechanical AVR - Chronic Coumadin  . Claudication   . Diverticulitis of colon   . Hx of colonic polyps   . Peripheral vascular disease     s/p Left external Iliac Artery stenting and subsequent left femoral endarterectomy 02/2011- post op course complicated by wound infxn req I&D 03/2011  . Bronchitis 12/17/11    "just gettin over a case"  . Anemia   . Blood transfusion    Past Surgical History  Procedure Laterality Date  . Splenectomy  ~ 2003  . Coronary artery bypass graft    . Aortic valve  replacement  ~ 2004  . Angioplasty / stenting iliac      Left external Iliac Artery  . Coronary angioplasty with stent placement  12/16/11    "1"  . Tonsillectomy  ~ 1952  . Cardiac valve replacement      aortic  . Aorta - bilateral femoral artery bypass graft  01/18/2012    Procedure: AORTA BIFEMORAL BYPASS GRAFT;  Surgeon: Gretta Began, MD;  Location: [facility] OR;  Service: Vascular;  Laterality: N/A;   Social History:  reports that he quit smoking about 19 years ago. His smoking use included Cigarettes. He has a 45 pack-year smoking history. He has never used smokeless tobacco. He reports that he drinks about 7.0 ounces of alcohol per week. He reports that he does not use illicit drugs.  where does patient live--home,  No Known Allergies  Family History  Problem Relation Age of Onset  . Coronary artery disease Mother     bypass surgery - deceased  . Heart disease Father     murmur, valve replacement - deceased  . Colon cancer Neg Hx   . Breast cancer Sister   . Diabetes      grandmother    Prior to Admission medications   Medication Sig Start Date End Date Taking? Authorizing Provider  amLODipine (NORVASC) 5 MG tablet Take 5 mg by mouth daily.    Yes Historical Provider, MD  amoxicillin (AMOXIL) 500 MG capsule Take 2,000 mg by mouth once. Taken prior to dental visits 08/12/11  Yes Historical Provider, MD  atorvastatin (LIPITOR) 40 MG  tablet Take 40 mg by mouth daily.    Yes Historical Provider, MD  glyBURIDE (DIABETA) 2.5 MG tablet Take 5 mg by mouth 2 (two) times daily with a meal.   Yes Historical Provider, MD  hydrochlorothiazide (MICROZIDE) 12.5 MG capsule Take 12.5 mg by mouth daily.     Yes Historical Provider, MD  metFORMIN (GLUCOPHAGE) 500 MG tablet Take 1,000 mg by mouth 2 (two) times daily with a meal.  03/08/12  Yes Historical Provider, MD  metoprolol (TOPROL-XL) 50 MG 24 hr tablet Take 50 mg by mouth daily.     Yes Historical Provider, MD  trandolapril (MAVIK) 4 MG tablet Take 1  tablet (4 mg total) by mouth daily. 04/05/13 04/05/14 Yes Wendall Stade, MD  warfarin (COUMADIN) 7.5 MG tablet Take 1 tablet (7.5 mg total) by mouth as directed. 01/24/13  Yes Wendall Stade, MD   Physical Exam: Filed Vitals:   05/05/13 1730 05/05/13 1745 05/05/13 1747 05/05/13 1820  BP: 158/75 158/57  153/49  Pulse: 72 72  72  Temp:   98.1 F (36.7 C) 98.1 F (36.7 C)  TempSrc:    Oral  Resp: 19 21  20   Height:      Weight:      SpO2: 94% 94%  96%    Constitutional: Vital signs reviewed.  Patient is a well-developed and well-nourished  in no acute distress and cooperative with exam. Alert and oriented x3.  Head: Normocephalic and atraumatic Nose: No erythema or drainage noted.  Turbinates normal Mouth: no erythema or exudates, MMM Eyes: PERRL, EOMI, conjunctivae normal, No scleral icterus.  Neck: Supple, Trachea midline normal ROM, No JVD, mass, thyromegaly, or carotid bruit present.  Cardiovascular: RRR, S1 normal, S2 normal, no MRG, pulses symmetric and intact bilaterally Pulmonary/Chest: normal respiratory effort, CTAB, no wheezes, rales, or rhonchi Abdominal: Soft. Non-tender, non-distended, bowel sounds are normal, no masses, organomegaly, or guarding present.  Musculoskeletal: No joint deformities, erythema, or stiffness, ROM full and no nontender  Neurological: A&O x3, Strength is normal and symmetric bilaterally, right facial droop, speech is thick, no focal motor deficit,   Psychiatric: Normal mood and affect. speech and behavior is normal. Judgment and thought content normal. Cognition and memory are normal.     Labs on Admission:  Basic Metabolic Panel:  Recent Labs Lab 05/05/13 1441 05/05/13 1453  NA 138 139  K 3.7 3.7  CL 100 103  CO2 28  --   GLUCOSE 172* 163*  BUN 13 12  CREATININE 0.70 0.80  CALCIUM 9.3  --    Liver Function Tests:  Recent Labs Lab 05/05/13 1441  AST 25  ALT 20  ALKPHOS 60  BILITOT 0.5  PROT 7.2  ALBUMIN 4.1   No results  found for this basename: LIPASE, AMYLASE,  in the last 168 hours No results found for this basename: AMMONIA,  in the last 168 hours CBC:  Recent Labs Lab 05/05/13 1441 05/05/13 1453  WBC 8.2  --   NEUTROABS 5.7  --   HGB 15.3 16.0  HCT 42.8 47.0  MCV 91.5  --   PLT 202  --    Cardiac Enzymes:  Recent Labs Lab 05/05/13 1441  TROPONINI <0.30    BNP (last 3 results) No results found for this basename: PROBNP,  in the last 8760 hours CBG: No results found for this basename: GLUCAP,  in the last 168 hours  Radiological Exams on Admission: Ct Head Wo Contrast  05/05/2013   *RADIOLOGY  REPORT*  Clinical Data: Code stroke.  Facial droop right.  Aphasia  CT HEAD WITHOUT CONTRAST  Technique:  Contiguous axial images were obtained from the base of the skull through the vertex without contrast.  Comparison: CT 09/03/2009  Findings: Mild atrophy.  Chronic infarct head of the caudate on the left.  Mild chronic microvascular ischemic change in the white matter.  These have progressed since the prior study.  No acute infarct.  Negative for hemorrhage or mass lesion.  Negative for hydrocephalus or midline shift.  Atherosclerotic calcification in the carotid vertebral arteries. No skull lesion.  IMPRESSION: Atrophy and chronic microvascular ischemia.  No acute abnormality.  Critical Value/emergent results were called by telephone at the time of interpretation on 05/05/2013 at 1442 hours to [name]. Patria Mane, who verbally acknowledged these results.   Original Report Authenticated By: Janeece Riggers, M.D.   Mr [facility] Wo Contrast  05/05/2013   *RADIOLOGY REPORT*  Clinical Data:  Intermittent symptoms of loss of balance and aphasia.  MRI HEAD WITHOUT AND WITH CONTRAST MRA HEAD WITHOUT CONTRAST MRA NECK WITHOUT AND WITH CONTRAST  Technique:  Multiplanar, multiecho pulse sequences of the brain and surrounding structures were obtained without and with intravenous contrast.  Angiographic images of the Circle of  [place] were obtained using MRA technique without intravenous contrast. Angiographic images of the neck were obtained using MRA technique without and with intravenous contrast.  Carotid stenosis measurements (when applicable) are obtained utilizing NASCET criteria, using the distal internal carotid diameter as the denominator.  Contrast: 20mL MULTIHANCE GADOBENATE DIMEGLUMINE 529 MG/ML IV SOLN  Comparison:  CT head without contrast to 05/05/2013.  MRI HEAD  Findings:  A punctate area of restricted diffusion is evident in the posterior left frontal cortex, likely along the pre motor strip.  There is no associated hemorrhage or mass lesion.  No significant T2 signal abnormality is associated.  No other acute infarct is present.  There is no hemorrhage or mass lesion.  Remote lacunar infarcts are present in the cerebellum bilaterally.  Moderate generalized atrophy is advanced for age. Extensive periventricular subcortical white matter changes are noted bilaterally.  The globes and orbits are intact.  Flow is present in the major intracranial arteries.  Mild mucosal thickening is present in the anterior ethmoid air cells and frontal sinuses bilaterally.  The remaining paranasal sinuses are clear. The mastoid air cells are clear.  IMPRESSION:  1.  Punctate non hemorrhagic infarcts in the posterior left frontal lobe, likely within the pre motor cortex. 2.  Moderate atrophy and generalized white matter disease is advanced for age.  This likely reflects the sequelae of chronic microvascular ischemia. 3.  Remote lacunar infarcts of the cerebellum bilaterally.  MRA HEAD  Findings: Mild atherosclerotic irregularity is present within the cavernous carotid arteries bilaterally.  There is no significant stenosis from the high cervical segments through the ICA termini. The A1 and M1 segments are within normal limits.  The MCA bifurcations are normal bilaterally.  Moderate distal segmental narrowing is present in the MCA branch  vessels bilaterally.  The ACA branch vessels are unremarkable.  Atherosclerotic irregularity is present within the vertebral arteries bilaterally.  The left vertebral artery is the dominant vessel.  There is a 50% stenosis of the left vertebral artery proximal to the PICA.  The left PICA origin is visualized and normal.  The right AICA is dominant.  The basilar artery is within the normal limits.  Both posterior cerebral arteries originate from the basilar tip.  Distal PCA branch vessel narrowing is evident bilaterally.  IMPRESSION:  1.  Intracranial atherosclerosis. 2.  Moderate small vessel disease, most evident in the MCA branch vessels bilaterally. 3.  Approximately 50% stenosis of the proximal left vertebral artery, proximal to the PICA. 4.  No significant proximal stenosis, aneurysm, or branch vessel occlusion.  MRA NECK  Findings: The time-of-flight images demonstrate no significant flow disturbance at either carotid bifurcation.  Flow is antegrade in the vertebral arteries bilaterally.  The postcontrast images demonstrate a standard three-vessel arch configuration.  The right subclavian artery is narrowed to less than 2 mm.  The distal right subclavian artery measures 6.5 mm. The vertebral arteries both originate from the subclavian arteries. There is focal narrowing of both vertebral artery origins, less than 50%, relative to the more distal vessel.  No significant tandem stenoses are present within the neck.  The right common carotid artery is within normal limits.  There is mild irregularity of the carotid bifurcation without a significant stenosis.  The left common carotid artery is within normal limits.  The left carotid bifurcation is unremarkable.  The cervical left ICA is normal.  IMPRESSION:  1. 70% stenosis of the proximal right subclavian artery proximal to the vertebral artery origin. 2.  Mild stenoses of the vertebral artery origins bilaterally are less than 50% relative  to the distal vessels.  3.  Slight atherosclerotic irregularity of the right carotid bifurcation without a significant stenosis. 4.  No significant disease at the left carotid bifurcation.   Original Report Authenticated By: Marin Roberts, M.D.   Mr Angiogram Neck W Wo Contrast  05/05/2013   *RADIOLOGY REPORT*  Clinical Data:  Intermittent symptoms of loss of balance and aphasia.  MRI HEAD WITHOUT AND WITH CONTRAST MRA HEAD WITHOUT CONTRAST MRA NECK WITHOUT AND WITH CONTRAST  Technique:  Multiplanar, multiecho pulse sequences of the brain and surrounding structures were obtained without and with intravenous contrast.  Angiographic images of the Circle of [place] were obtained using MRA technique without intravenous contrast. Angiographic images of the neck were obtained using MRA technique without and with intravenous contrast.  Carotid stenosis measurements (when applicable) are obtained utilizing NASCET criteria, using the distal internal carotid diameter as the denominator.  Contrast: 20mL MULTIHANCE GADOBENATE DIMEGLUMINE 529 MG/ML IV SOLN  Comparison:  CT head without contrast to 05/05/2013.  MRI HEAD  Findings:  A punctate area of restricted diffusion is evident in the posterior left frontal cortex, likely along the pre motor strip.  There is no associated hemorrhage or mass lesion.  No significant T2 signal abnormality is associated.  No other acute infarct is present.  There is no hemorrhage or mass lesion.  Remote lacunar infarcts are present in the cerebellum bilaterally.  Moderate generalized atrophy is advanced for age. Extensive periventricular subcortical white matter changes are noted bilaterally.  The globes and orbits are intact.  Flow is present in the major intracranial arteries.  Mild mucosal thickening is present in the anterior ethmoid air cells and frontal sinuses bilaterally.  The remaining paranasal sinuses are clear. The mastoid air cells are clear.  IMPRESSION:  1.  Punctate non hemorrhagic infarcts in  the posterior left frontal lobe, likely within the pre motor cortex. 2.  Moderate atrophy and generalized white matter disease is advanced for age.  This likely reflects the sequelae of chronic microvascular ischemia. 3.  Remote lacunar infarcts of the cerebellum bilaterally.  MRA HEAD  Findings: Mild atherosclerotic irregularity is present within the cavernous carotid arteries  bilaterally.  There is no significant stenosis from the high cervical segments through the ICA termini. The A1 and M1 segments are within normal limits.  The MCA bifurcations are normal bilaterally.  Moderate distal segmental narrowing is present in the MCA branch vessels bilaterally.  The ACA branch vessels are unremarkable.  Atherosclerotic irregularity is present within the vertebral arteries bilaterally.  The left vertebral artery is the dominant vessel.  There is a 50% stenosis of the left vertebral artery proximal to the PICA.  The left PICA origin is visualized and normal.  The right AICA is dominant.  The basilar artery is within the normal limits.  Both posterior cerebral arteries originate from the basilar tip.  Distal PCA branch vessel narrowing is evident bilaterally.  IMPRESSION:  1.  Intracranial atherosclerosis. 2.  Moderate small vessel disease, most evident in the MCA branch vessels bilaterally. 3.  Approximately 50% stenosis of the proximal left vertebral artery, proximal to the PICA. 4.  No significant proximal stenosis, aneurysm, or branch vessel occlusion.  MRA NECK  Findings: The time-of-flight images demonstrate no significant flow disturbance at either carotid bifurcation.  Flow is antegrade in the vertebral arteries bilaterally.  The postcontrast images demonstrate a standard three-vessel arch configuration.  The right subclavian artery is narrowed to less than 2 mm.  The distal right subclavian artery measures 6.5 mm. The vertebral arteries both originate from the subclavian arteries. There is focal narrowing of both  vertebral artery origins, less than 50%, relative to the more distal vessel.  No significant tandem stenoses are present within the neck.  The right common carotid artery is within normal limits.  There is mild irregularity of the carotid bifurcation without a significant stenosis.  The left common carotid artery is within normal limits.  The left carotid bifurcation is unremarkable.  The cervical left ICA is normal.  IMPRESSION:  1. 70% stenosis of the proximal right subclavian artery proximal to the vertebral artery origin. 2.  Mild stenoses of the vertebral artery origins bilaterally are less than 50% relative  to the distal vessels. 3.  Slight atherosclerotic irregularity of the right carotid bifurcation without a significant stenosis. 4.  No significant disease at the left carotid bifurcation.   Original Report Authenticated By: Marin Roberts, M.D.   Mr Laqueta Jean Wo Contrast  05/05/2013   *RADIOLOGY REPORT*  Clinical Data:  Intermittent symptoms of loss of balance and aphasia.  MRI HEAD WITHOUT AND WITH CONTRAST MRA HEAD WITHOUT CONTRAST MRA NECK WITHOUT AND WITH CONTRAST  Technique:  Multiplanar, multiecho pulse sequences of the brain and surrounding structures were obtained without and with intravenous contrast.  Angiographic images of the Circle of [place] were obtained using MRA technique without intravenous contrast. Angiographic images of the neck were obtained using MRA technique without and with intravenous contrast.  Carotid stenosis measurements (when applicable) are obtained utilizing NASCET criteria, using the distal internal carotid diameter as the denominator.  Contrast: 20mL MULTIHANCE GADOBENATE DIMEGLUMINE 529 MG/ML IV SOLN  Comparison:  CT head without contrast to 05/05/2013.  MRI HEAD  Findings:  A punctate area of restricted diffusion is evident in the posterior left frontal cortex, likely along the pre motor strip.  There is no associated hemorrhage or mass lesion.  No significant T2  signal abnormality is associated.  No other acute infarct is present.  There is no hemorrhage or mass lesion.  Remote lacunar infarcts are present in the cerebellum bilaterally.  Moderate generalized atrophy is advanced for age. Extensive periventricular  subcortical white matter changes are noted bilaterally.  The globes and orbits are intact.  Flow is present in the major intracranial arteries.  Mild mucosal thickening is present in the anterior ethmoid air cells and frontal sinuses bilaterally.  The remaining paranasal sinuses are clear. The mastoid air cells are clear.  IMPRESSION:  1.  Punctate non hemorrhagic infarcts in the posterior left frontal lobe, likely within the pre motor cortex. 2.  Moderate atrophy and generalized white matter disease is advanced for age.  This likely reflects the sequelae of chronic microvascular ischemia. 3.  Remote lacunar infarcts of the cerebellum bilaterally.  MRA HEAD  Findings: Mild atherosclerotic irregularity is present within the cavernous carotid arteries bilaterally.  There is no significant stenosis from the high cervical segments through the ICA termini. The A1 and M1 segments are within normal limits.  The MCA bifurcations are normal bilaterally.  Moderate distal segmental narrowing is present in the MCA branch vessels bilaterally.  The ACA branch vessels are unremarkable.  Atherosclerotic irregularity is present within the vertebral arteries bilaterally.  The left vertebral artery is the dominant vessel.  There is a 50% stenosis of the left vertebral artery proximal to the PICA.  The left PICA origin is visualized and normal.  The right AICA is dominant.  The basilar artery is within the normal limits.  Both posterior cerebral arteries originate from the basilar tip.  Distal PCA branch vessel narrowing is evident bilaterally.  IMPRESSION:  1.  Intracranial atherosclerosis. 2.  Moderate small vessel disease, most evident in the MCA branch vessels bilaterally. 3.   Approximately 50% stenosis of the proximal left vertebral artery, proximal to the PICA. 4.  No significant proximal stenosis, aneurysm, or branch vessel occlusion.  MRA NECK  Findings: The time-of-flight images demonstrate no significant flow disturbance at either carotid bifurcation.  Flow is antegrade in the vertebral arteries bilaterally.  The postcontrast images demonstrate a standard three-vessel arch configuration.  The right subclavian artery is narrowed to less than 2 mm.  The distal right subclavian artery measures 6.5 mm. The vertebral arteries both originate from the subclavian arteries. There is focal narrowing of both vertebral artery origins, less than 50%, relative to the more distal vessel.  No significant tandem stenoses are present within the neck.  The right common carotid artery is within normal limits.  There is mild irregularity of the carotid bifurcation without a significant stenosis.  The left common carotid artery is within normal limits.  The left carotid bifurcation is unremarkable.  The cervical left ICA is normal.  IMPRESSION:  1. 70% stenosis of the proximal right subclavian artery proximal to the vertebral artery origin. 2.  Mild stenoses of the vertebral artery origins bilaterally are less than 50% relative  to the distal vessels. 3.  Slight atherosclerotic irregularity of the right carotid bifurcation without a significant stenosis. 4.  No significant disease at the left carotid bifurcation.   Original Report Authenticated By: Marin Roberts, M.D.    EKG:NSR  Assessment/Plan Active Problems:      1. Acute CVA; - admit to neuro telemetry - start IV heparin and coumadin as per pharmacy. - echo and duplex of the carotid arteries ordered - lipid panel and hgba1c  - pT/OT eval. - neuro on board.   2. Hypertension: permissive hypertension. Resume b blocker.   3. Diabetes mellitus: hgba1c, and SSI in patient.   4. AVR: subtherapeutic INR. REPORTS last INR check  was 2 months ago.   5. Hyperlipidemia: resume  statin.   DVT prophylaxis: on therapeutic anticoagulation.   Code Status: full code Family Communication: wife at bedside Disposition Plan: pending PT eval.  Time spent: 85 min  Modestine Scherzinger [facility] Pager [phone]  If 7PM-7AM, please contact night-coverage [email or web address] Password [facility] 05/05/2013, 7:28 PM

## 2013-05-05 NOTE — Consult Note (Signed)
Referring Physician: Dr. Patria Mane, ED physician    Chief Complaint: slurred speech and right sided facial droop  HPI: [name] [name] is an 67 y.o. male white male with DM, HTN, HL, CAD s/p CABG, Aortic stenosis s/p [employer] mechanical AVR on coumadin presenting to [facility] via EMS after experiencing sudden onset slurred speech and difficulty communicating while playing golf this afternoon.  He claims symptoms started right around 1230pm and have improved since arrival to ED.  He denies any similar prior episodes.  He denies headache, vision changes or disturbance, N/V/D, or loss of consciousness.    Of note, NIH scale 4 in ED, mild symptoms noted with right facial droop, slurred speech, and right side decreased sensation.  INR in ED 1.52.   Date last known well: Date: 05/05/2013 Time last known well: 12:30pm tPA Given: No:  PT >15 seconds, mild symptoms on arrival  Past Medical History  Diagnosis Date  . Diabetes mellitus   . Hypertension   . Hyperlipidemia   . ITP (idiopathic thrombocytopenic purpura)   . Coronary artery disease     s/p cabg x 3 11/2003: lima-lad, seq vg to rpda and rpl  . Aortic stenosis     s/p st. jude mechanical AVR - Chronic Coumadin  . Claudication   . Diverticulitis of colon   . Hx of colonic polyps   . Peripheral vascular disease     s/p Left external Iliac Artery stenting and subsequent left femoral endarterectomy 02/2011- post op course complicated by wound infxn req I&D 03/2011  . Bronchitis 12/17/11    "just gettin over a case"  . Anemia   . Blood transfusion     Past Surgical History  Procedure Laterality Date  . Splenectomy  ~ 2003  . Coronary artery bypass graft    . Aortic valve replacement  ~ 2004  . Angioplasty / stenting iliac      Left external Iliac Artery  . Coronary angioplasty with stent placement  12/16/11    "1"  . Tonsillectomy  ~ 1952  . Cardiac valve replacement      aortic  . Aorta - bilateral femoral artery bypass graft   01/18/2012    Procedure: AORTA BIFEMORAL BYPASS GRAFT;  Surgeon: Gretta Began, MD;  Location: [facility] OR;  Service: Vascular;  Laterality: N/A;   Family History  Problem Relation Age of Onset  . Coronary artery disease Mother     bypass surgery - deceased  . Heart disease Father     murmur, valve replacement - deceased  . Colon cancer Neg Hx   . Breast cancer Sister   . Diabetes      grandmother   Social History:  reports that he quit smoking about 19 years ago. His smoking use included Cigarettes. He has a 45 pack-year smoking history. He has never used smokeless tobacco. He reports that he drinks about 7.0 ounces of alcohol per week. He reports that he does not use illicit drugs.  Allergies: No Known Allergies  Medications: Current facility-administered medications:heparin ADULT infusion 100 units/mL (25000 units/250 mL), 1,000 Units/hr, Intravenous, Continuous, Drake Leach Rumbarger, RPH;  ondansetron [facility]) injection 4 mg, 4 mg, Intravenous, Q8H PRN, Nelia Shi, MD;  warfarin (COUMADIN) tablet 5 mg, 5 mg, Oral, Once, Drake Leach Rumbarger, [facility];  [START ON 05/06/2013] Warfarin - Pharmacist Dosing Inpatient, , Does not apply, q1800, Drake Leach Rumbarger, RPH I have reviewed the patient's current medications.  Review of Systems:  Constitutional:  Denies fever, chills,  diaphoresis, appetite change and fatigue.   HEENT:  Denies congestion, sore throat, rhinorrhea, sneezing, mouth sores, trouble swallowing, neck pain   Respiratory:  Denies SOB, DOE, cough, and wheezing.   Cardiovascular:  Denies palpitations and leg swelling.   Gastrointestinal:  Denies nausea, vomiting, abdominal pain, diarrhea, constipation, blood in stool and abdominal distention.   Genitourinary:  Denies dysuria, urgency, frequency, hematuria, flank pain and difficulty urinating.   Musculoskeletal:  Denies myalgias, back pain, joint swelling, arthralgias and gait problem.   Skin:  Denies pallor, rash and wound.    Neurological:  Right arm numbness/tingling, slurred speech, facial droop. Denies dizziness, seizures, syncope, weakness, light-headedness, and headaches.    History obtained from the patient  Neurologic Examination:                                                                                                      Blood pressure 158/57, pulse 72, temperature 98.1 F (36.7 C), resp. rate 21, height 5\' 8"  (1.727 m), weight 183 lb 8 oz (83.235 kg), SpO2 94.00%.  Mental Status: Alert, oriented, thought content appropriate.  Speech fluent without evidence of aphasia.  Able to follow 3 step commands without difficulty. No signs of neglect.  Mild dysarthria.  Cranial Nerves: II: Discs flat bilaterally; Visual fields grossly normal, pupils equal, round, reactive to light and accommodation III,IV, VI: ptosis not present, extra-ocular motions intact bilaterally V,VII: smile asymmetric with right droop, facial light touch sensation normal bilaterally VIII: hearing normal bilaterally IX,X: gag reflex present XI: bilateral shoulder shrug XII: midline tongue extension Motor: Right : Upper extremity   5/5    Left:     Upper extremity   5/5  Lower extremity   5/5     Lower extremity   5/5 Tone and bulk:normal tone throughout; no atrophy noted Sensory: mildly decreased on right to pin Deep Tendon Reflexes: 2+ and symmetric throughout Plantars: Right: downgoing   Left: downgoing Cerebellar: normal finger-to-nose,  normal heel-to-shin test Gait: Not assessed due to acute nature of evaluation and multiple medical monitors in ED setting. CV: pulses palpable throughout    Results for orders placed during the hospital encounter of 05/05/13 (from the past 48 hour(s))  PROTIME-INR     Status: Abnormal   Collection Time    05/05/13  2:41 PM      Result Value Range   Prothrombin Time 17.9 (*) 11.6 - 15.2 seconds   INR 1.52 (*) 0.00 - 1.49  APTT     Status: None   Collection Time    05/05/13  2:41  PM      Result Value Range   aPTT 33  24 - 37 seconds  CBC     Status: None   Collection Time    05/05/13  2:41 PM      Result Value Range   WBC 8.2  4.0 - 10.5 K/uL   RBC 4.68  4.22 - 5.81 MIL/uL   Hemoglobin 15.3  13.0 - 17.0 g/dL   HCT 16.1  09.6 - 04.5 %   MCV 91.5  78.0 - 100.0 fL   MCH 32.7  26.0 - 34.0 pg   MCHC 35.7  30.0 - 36.0 g/dL   RDW 78.4  69.6 - 29.5 %   Platelets 202  150 - 400 K/uL  DIFFERENTIAL     Status: None   Collection Time    05/05/13  2:41 PM      Result Value Range   Neutrophils Relative % 69  43 - 77 %   Neutro Abs 5.7  1.7 - 7.7 K/uL   Lymphocytes Relative 19  12 - 46 %   Lymphs Abs 1.6  0.7 - 4.0 K/uL   Monocytes Relative 10  3 - 12 %   Monocytes Absolute 0.8  0.1 - 1.0 K/uL   Eosinophils Relative 1  0 - 5 %   Eosinophils Absolute 0.1  0.0 - 0.7 K/uL   Basophils Relative 1  0 - 1 %   Basophils Absolute 0.0  0.0 - 0.1 K/uL  COMPREHENSIVE METABOLIC PANEL     Status: Abnormal   Collection Time    05/05/13  2:41 PM      Result Value Range   Sodium 138  135 - 145 mEq/L   Potassium 3.7  3.5 - 5.1 mEq/L   Chloride 100  96 - 112 mEq/L   CO2 28  19 - 32 mEq/L   Glucose, Bld 172 (*) 70 - 99 mg/dL   BUN 13  6 - 23 mg/dL   Creatinine, Ser 2.84  0.50 - 1.35 mg/dL   Calcium 9.3  8.4 - 13.2 mg/dL   Total Protein 7.2  6.0 - 8.3 g/dL   Albumin 4.1  3.5 - 5.2 g/dL   AST 25  0 - 37 U/L   ALT 20  0 - 53 U/L   Alkaline Phosphatase 60  39 - 117 U/L   Total Bilirubin 0.5  0.3 - 1.2 mg/dL   GFR calc non Af Amer >90  >90 mL/min   GFR calc Af Amer >90  >90 mL/min   Comment:            The eGFR has been calculated     using the CKD EPI equation.     This calculation has not been     validated in all clinical     situations.     eGFR's persistently     <90 mL/min signify     possible Chronic Kidney Disease.  TROPONIN I     Status: None   Collection Time    05/05/13  2:41 PM      Result Value Range   Troponin I <0.30  <0.30 ng/mL   Comment:             Due to the release kinetics of cTnI,     a negative result within the first hours     of the onset of symptoms does not rule out     myocardial infarction with certainty.     If myocardial infarction is still suspected,     repeat the test at appropriate intervals.  POCT I-STAT, CHEM 8     Status: Abnormal   Collection Time    05/05/13  2:53 PM      Result Value Range   Sodium 139  135 - 145 mEq/L   Potassium 3.7  3.5 - 5.1 mEq/L   Chloride 103  96 - 112 mEq/L   BUN 12  6 - 23 mg/dL   Creatinine, Ser  0.80  0.50 - 1.35 mg/dL   Glucose, Bld 161 (*) 70 - 99 mg/dL   Calcium, Ion 0.96  0.45 - 1.30 mmol/L   TCO2 28  0 - 100 mmol/L   Hemoglobin 16.0  13.0 - 17.0 g/dL   HCT 40.9  81.1 - 91.4 %   Ct Head Wo Contrast  05/05/2013   *RADIOLOGY REPORT*  Clinical Data: Code stroke.  Facial droop right.  Aphasia  CT HEAD WITHOUT CONTRAST  Technique:  Contiguous axial images were obtained from the base of the skull through the vertex without contrast.  Comparison: CT 09/03/2009  Findings: Mild atrophy.  Chronic infarct head of the caudate on the left.  Mild chronic microvascular ischemic change in the white matter.  These have progressed since the prior study.  No acute infarct.  Negative for hemorrhage or mass lesion.  Negative for hydrocephalus or midline shift.  Atherosclerotic calcification in the carotid vertebral arteries. No skull lesion.  IMPRESSION: Atrophy and chronic microvascular ischemia.  No acute abnormality.  Critical Value/emergent results were called by telephone at the time of interpretation on 05/05/2013 at 1442 hours to Dr. Patria Mane, who verbally acknowledged these results.   Original Report Authenticated By: Janeece Riggers, M.D.   Mr [facility] Wo Contrast  05/05/2013   *RADIOLOGY REPORT*  Clinical Data:  Intermittent symptoms of loss of balance and aphasia.  MRI HEAD WITHOUT AND WITH CONTRAST MRA HEAD WITHOUT CONTRAST MRA NECK WITHOUT AND WITH CONTRAST  Technique:  Multiplanar, multiecho  pulse sequences of the brain and surrounding structures were obtained without and with intravenous contrast.  Angiographic images of the Circle of Willis were obtained using MRA technique without intravenous contrast. Angiographic images of the neck were obtained using MRA technique without and with intravenous contrast.  Carotid stenosis measurements (when applicable) are obtained utilizing NASCET criteria, using the distal internal carotid diameter as the denominator.  Contrast: 20mL MULTIHANCE GADOBENATE DIMEGLUMINE 529 MG/ML IV SOLN  Comparison:  CT head without contrast to 05/05/2013.  MRI HEAD  Findings:  A punctate area of restricted diffusion is evident in the posterior left frontal cortex, likely along the pre motor strip.  There is no associated hemorrhage or mass lesion.  No significant T2 signal abnormality is associated.  No other acute infarct is present.  There is no hemorrhage or mass lesion.  Remote lacunar infarcts are present in the cerebellum bilaterally.  Moderate generalized atrophy is advanced for age. Extensive periventricular subcortical white matter changes are noted bilaterally.  The globes and orbits are intact.  Flow is present in the major intracranial arteries.  Mild mucosal thickening is present in the anterior ethmoid air cells and frontal sinuses bilaterally.  The remaining paranasal sinuses are clear. The mastoid air cells are clear.  IMPRESSION:  1.  Punctate non hemorrhagic infarcts in the posterior left frontal lobe, likely within the pre motor cortex. 2.  Moderate atrophy and generalized white matter disease is advanced for age.  This likely reflects the sequelae of chronic microvascular ischemia. 3.  Remote lacunar infarcts of the cerebellum bilaterally.  MRA HEAD  Findings: Mild atherosclerotic irregularity is present within the cavernous carotid arteries bilaterally.  There is no significant stenosis from the high cervical segments through the ICA termini. The A1 and M1  segments are within normal limits.  The MCA bifurcations are normal bilaterally.  Moderate distal segmental narrowing is present in the MCA branch vessels bilaterally.  The ACA branch vessels are unremarkable.  Atherosclerotic irregularity is present  within the vertebral arteries bilaterally.  The left vertebral artery is the dominant vessel.  There is a 50% stenosis of the left vertebral artery proximal to the PICA.  The left PICA origin is visualized and normal.  The right AICA is dominant.  The basilar artery is within the normal limits.  Both posterior cerebral arteries originate from the basilar tip.  Distal PCA branch vessel narrowing is evident bilaterally.  IMPRESSION:  1.  Intracranial atherosclerosis. 2.  Moderate small vessel disease, most evident in the MCA branch vessels bilaterally. 3.  Approximately 50% stenosis of the proximal left vertebral artery, proximal to the PICA. 4.  No significant proximal stenosis, aneurysm, or branch vessel occlusion.  MRA NECK  Findings: The time-of-flight images demonstrate no significant flow disturbance at either carotid bifurcation.  Flow is antegrade in the vertebral arteries bilaterally.  The postcontrast images demonstrate a standard three-vessel arch configuration.  The right subclavian artery is narrowed to less than 2 mm.  The distal right subclavian artery measures 6.5 mm. The vertebral arteries both originate from the subclavian arteries. There is focal narrowing of both vertebral artery origins, less than 50%, relative to the more distal vessel.  No significant tandem stenoses are present within the neck.  The right common carotid artery is within normal limits.  There is mild irregularity of the carotid bifurcation without a significant stenosis.  The left common carotid artery is within normal limits.  The left carotid bifurcation is unremarkable.  The cervical left ICA is normal.  IMPRESSION:  1. 70% stenosis of the proximal right subclavian artery  proximal to the vertebral artery origin. 2.  Mild stenoses of the vertebral artery origins bilaterally are less than 50% relative  to the distal vessels. 3.  Slight atherosclerotic irregularity of the right carotid bifurcation without a significant stenosis. 4.  No significant disease at the left carotid bifurcation.   Original Report Authenticated By: Marin Roberts, M.D.   Mr Angiogram Neck W Wo Contrast  05/05/2013   *RADIOLOGY REPORT*  Clinical Data:  Intermittent symptoms of loss of balance and aphasia.  MRI HEAD WITHOUT AND WITH CONTRAST MRA HEAD WITHOUT CONTRAST MRA NECK WITHOUT AND WITH CONTRAST  Technique:  Multiplanar, multiecho pulse sequences of the brain and surrounding structures were obtained without and with intravenous contrast.  Angiographic images of the Circle of Willis were obtained using MRA technique without intravenous contrast. Angiographic images of the neck were obtained using MRA technique without and with intravenous contrast.  Carotid stenosis measurements (when applicable) are obtained utilizing NASCET criteria, using the distal internal carotid diameter as the denominator.  Contrast: 20mL MULTIHANCE GADOBENATE DIMEGLUMINE 529 MG/ML IV SOLN  Comparison:  CT head without contrast to 05/05/2013.  MRI HEAD  Findings:  A punctate area of restricted diffusion is evident in the posterior left frontal cortex, likely along the pre motor strip.  There is no associated hemorrhage or mass lesion.  No significant T2 signal abnormality is associated.  No other acute infarct is present.  There is no hemorrhage or mass lesion.  Remote lacunar infarcts are present in the cerebellum bilaterally.  Moderate generalized atrophy is advanced for age. Extensive periventricular subcortical white matter changes are noted bilaterally.  The globes and orbits are intact.  Flow is present in the major intracranial arteries.  Mild mucosal thickening is present in the anterior ethmoid air cells and frontal  sinuses bilaterally.  The remaining paranasal sinuses are clear. The mastoid air cells are clear.  IMPRESSION:  1.  Punctate non hemorrhagic infarcts in the posterior left frontal lobe, likely within the pre motor cortex. 2.  Moderate atrophy and generalized white matter disease is advanced for age.  This likely reflects the sequelae of chronic microvascular ischemia. 3.  Remote lacunar infarcts of the cerebellum bilaterally.  MRA HEAD  Findings: Mild atherosclerotic irregularity is present within the cavernous carotid arteries bilaterally.  There is no significant stenosis from the high cervical segments through the ICA termini. The A1 and M1 segments are within normal limits.  The MCA bifurcations are normal bilaterally.  Moderate distal segmental narrowing is present in the MCA branch vessels bilaterally.  The ACA branch vessels are unremarkable.  Atherosclerotic irregularity is present within the vertebral arteries bilaterally.  The left vertebral artery is the dominant vessel.  There is a 50% stenosis of the left vertebral artery proximal to the PICA.  The left PICA origin is visualized and normal.  The right AICA is dominant.  The basilar artery is within the normal limits.  Both posterior cerebral arteries originate from the basilar tip.  Distal PCA branch vessel narrowing is evident bilaterally.  IMPRESSION:  1.  Intracranial atherosclerosis. 2.  Moderate small vessel disease, most evident in the MCA branch vessels bilaterally. 3.  Approximately 50% stenosis of the proximal left vertebral artery, proximal to the PICA. 4.  No significant proximal stenosis, aneurysm, or branch vessel occlusion.  MRA NECK  Findings: The time-of-flight images demonstrate no significant flow disturbance at either carotid bifurcation.  Flow is antegrade in the vertebral arteries bilaterally.  The postcontrast images demonstrate a standard three-vessel arch configuration.  The right subclavian artery is narrowed to less than 2 mm.   The distal right subclavian artery measures 6.5 mm. The vertebral arteries both originate from the subclavian arteries. There is focal narrowing of both vertebral artery origins, less than 50%, relative to the more distal vessel.  No significant tandem stenoses are present within the neck.  The right common carotid artery is within normal limits.  There is mild irregularity of the carotid bifurcation without a significant stenosis.  The left common carotid artery is within normal limits.  The left carotid bifurcation is unremarkable.  The cervical left ICA is normal.  IMPRESSION:  1. 70% stenosis of the proximal right subclavian artery proximal to the vertebral artery origin. 2.  Mild stenoses of the vertebral artery origins bilaterally are less than 50% relative  to the distal vessels. 3.  Slight atherosclerotic irregularity of the right carotid bifurcation without a significant stenosis. 4.  No significant disease at the left carotid bifurcation.   Original Report Authenticated By: Marin Roberts, M.D.   Mr Laqueta Jean Wo Contrast  05/05/2013   *RADIOLOGY REPORT*  Clinical Data:  Intermittent symptoms of loss of balance and aphasia.  MRI HEAD WITHOUT AND WITH CONTRAST MRA HEAD WITHOUT CONTRAST MRA NECK WITHOUT AND WITH CONTRAST  Technique:  Multiplanar, multiecho pulse sequences of the brain and surrounding structures were obtained without and with intravenous contrast.  Angiographic images of the Circle of Willis were obtained using MRA technique without intravenous contrast. Angiographic images of the neck were obtained using MRA technique without and with intravenous contrast.  Carotid stenosis measurements (when applicable) are obtained utilizing NASCET criteria, using the distal internal carotid diameter as the denominator.  Contrast: 20mL MULTIHANCE GADOBENATE DIMEGLUMINE 529 MG/ML IV SOLN  Comparison:  CT head without contrast to 05/05/2013.  MRI HEAD  Findings:  A punctate area of restricted diffusion  is evident in  the posterior left frontal cortex, likely along the pre motor strip.  There is no associated hemorrhage or mass lesion.  No significant T2 signal abnormality is associated.  No other acute infarct is present.  There is no hemorrhage or mass lesion.  Remote lacunar infarcts are present in the cerebellum bilaterally.  Moderate generalized atrophy is advanced for age. Extensive periventricular subcortical white matter changes are noted bilaterally.  The globes and orbits are intact.  Flow is present in the major intracranial arteries.  Mild mucosal thickening is present in the anterior ethmoid air cells and frontal sinuses bilaterally.  The remaining paranasal sinuses are clear. The mastoid air cells are clear.  IMPRESSION:  1.  Punctate non hemorrhagic infarcts in the posterior left frontal lobe, likely within the pre motor cortex. 2.  Moderate atrophy and generalized white matter disease is advanced for age.  This likely reflects the sequelae of chronic microvascular ischemia. 3.  Remote lacunar infarcts of the cerebellum bilaterally.  MRA HEAD  Findings: Mild atherosclerotic irregularity is present within the cavernous carotid arteries bilaterally.  There is no significant stenosis from the high cervical segments through the ICA termini. The A1 and M1 segments are within normal limits.  The MCA bifurcations are normal bilaterally.  Moderate distal segmental narrowing is present in the MCA branch vessels bilaterally.  The ACA branch vessels are unremarkable.  Atherosclerotic irregularity is present within the vertebral arteries bilaterally.  The left vertebral artery is the dominant vessel.  There is a 50% stenosis of the left vertebral artery proximal to the PICA.  The left PICA origin is visualized and normal.  The right AICA is dominant.  The basilar artery is within the normal limits.  Both posterior cerebral arteries originate from the basilar tip.  Distal PCA branch vessel narrowing is evident  bilaterally.  IMPRESSION:  1.  Intracranial atherosclerosis. 2.  Moderate small vessel disease, most evident in the MCA branch vessels bilaterally. 3.  Approximately 50% stenosis of the proximal left vertebral artery, proximal to the PICA. 4.  No significant proximal stenosis, aneurysm, or branch vessel occlusion.  MRA NECK  Findings: The time-of-flight images demonstrate no significant flow disturbance at either carotid bifurcation.  Flow is antegrade in the vertebral arteries bilaterally.  The postcontrast images demonstrate a standard three-vessel arch configuration.  The right subclavian artery is narrowed to less than 2 mm.  The distal right subclavian artery measures 6.5 mm. The vertebral arteries both originate from the subclavian arteries. There is focal narrowing of both vertebral artery origins, less than 50%, relative to the more distal vessel.  No significant tandem stenoses are present within the neck.  The right common carotid artery is within normal limits.  There is mild irregularity of the carotid bifurcation without a significant stenosis.  The left common carotid artery is within normal limits.  The left carotid bifurcation is unremarkable.  The cervical left ICA is normal.  IMPRESSION:  1. 70% stenosis of the proximal right subclavian artery proximal to the vertebral artery origin. 2.  Mild stenoses of the vertebral artery origins bilaterally are less than 50% relative  to the distal vessels. 3.  Slight atherosclerotic irregularity of the right carotid bifurcation without a significant stenosis. 4.  No significant disease at the left carotid bifurcation.   Original Report Authenticated By: Marin Roberts, M.D.    Assessment and plan discussed with attending physician, Dr. Amada Jupiter and they are in agreement.    Signed: Darden Palmer, MD PGY-I, Internal Medicine Resident  Pager: [phone]  05/05/2013,6:35 PM   I have seen and evaluated the patient. I have reviewed the above note  and made appropriate changes.   Assessment: 67 y.o. male white male with PMH of DM2, HTN, CAD s/p CABG, and AS s/p st jude mechanical AVR on coumadin presented to ED as code stroke.  Symptoms of slurred speech, difficulty communication, and right side facial droop started around 1230pm today while playing golf. Waxing and waning symptoms in ED.  Not candidate for tPA due to elevated PT.  CT head showed no acute abnormality, atrophy and chronic microvascular ischemia.    Stroke Risk Factors - diabetes mellitus, hyperlipidemia and hypertension  Plan: 1. HgbA1c, fasting lipid panel 2. PT consult, OT consult, Speech consult 3. Echocardiogram 4. Prophylactic therapy-Anticoagulation: continue Coumadin 5. I would start IV heparin(stroke protocol) given high risk of recurrent embolus from mechanical valve and small size of infarct. Desmond Lope is some risk with IV heparin in the setting of acute infarct, but with the small size, I feel that the risk of heparin is outweighed by the risk of embolus.  6. Can restart oral anticoagulation for valve.    Ritta Slot, MD [facility] [phone]  If 7pm- 7am, please page neurology on call at [phone].

## 2013-05-05 NOTE — ED Notes (Signed)
Still in MRI

## 2013-05-05 NOTE — ED Notes (Signed)
Attempted report 

## 2013-05-05 NOTE — ED Notes (Signed)
Dr. Amada Jupiter at bedside to speak with pt about trial, risks and options.

## 2013-05-05 NOTE — ED Notes (Signed)
Pt on MRI table now.

## 2013-05-05 NOTE — ED Provider Notes (Signed)
History     CSN: [id number]  Arrival date & time 05/05/13  1417   First MD Initiated Contact with Patient 05/05/13 1439      Chief Complaint  Patient presents with  . Aphasia    HPI Patient presents with right-sided facial weakness some right-sided arm numbness and reported some weakness of his right arm as well as difficulty speaking.  He states that this began around 1 PM when he was on the [place]th [place] a golf course.  He went ahead and finished the whole and drove himself home.  When he returned home he called EMS.  Denies headache at this time.  His had no recent head trauma.  He is on Coumadin for a mechanical valve.  He has a history of coronary artery bypass grafting.  He has no prior history of stroke.  He denies lower from his symptoms.  He reports his speech may be improving slightly.  Code stroke called on arrival to the emergency department.  His symptoms are mild to moderate in severity.  Nothing worsens or improves his symptoms Past Medical History  Diagnosis Date  . Diabetes mellitus   . Hypertension   . Hyperlipidemia   . ITP (idiopathic thrombocytopenic purpura)   . Coronary artery disease     s/p cabg x 3 11/2003: lima-lad, seq vg to rpda and rpl  . Aortic stenosis     s/p st. jude mechanical AVR - Chronic Coumadin  . Claudication   . Diverticulitis of colon   . Hx of colonic polyps   . Peripheral vascular disease     s/p Left external Iliac Artery stenting and subsequent left femoral endarterectomy 02/2011- post op course complicated by wound infxn req I&D 03/2011  . Bronchitis 12/17/11    "just gettin over a case"  . Anemia   . Blood transfusion     Past Surgical History  Procedure Laterality Date  . Splenectomy  ~ 2003  . Coronary artery bypass graft    . Aortic valve replacement  ~ 2004  . Angioplasty / stenting iliac      Left external Iliac Artery  . Coronary angioplasty with stent placement  12/16/11    "1"  . Tonsillectomy  ~ 1952  . Cardiac valve  replacement      aortic  . Aorta - bilateral femoral artery bypass graft  01/18/2012    Procedure: AORTA BIFEMORAL BYPASS GRAFT;  Surgeon: Gretta Began, MD;  Location: [facility] OR;  Service: Vascular;  Laterality: N/A;    Family History  Problem Relation Age of Onset  . Coronary artery disease Mother     bypass surgery - deceased  . Heart disease Father     murmur, valve replacement - deceased  . Colon cancer Neg Hx   . Breast cancer Sister   . Diabetes      grandmother    History  Substance Use Topics  . Smoking status: Former Smoker -- 1.50 packs/day for 30 years    Types: Cigarettes    Quit date: 12/15/1993  . Smokeless tobacco: Never Used  . Alcohol Use: 7.0 oz/week    14 drink(s) per week     Comment: drinks 2 martini's a night      Review of Systems  All other systems reviewed and are negative.    Allergies  Review of patient's allergies indicates no known allergies.  Home Medications   Current Outpatient Rx  Name  Route  Sig  Dispense  Refill  .  amLODipine (NORVASC) 5 MG tablet   Oral   Take 10 mg by mouth daily.          [name] amoxicillin (AMOXIL) 500 MG capsule   Oral   Take 500 mg by mouth once. Taken prior to dental visits         . atorvastatin (LIPITOR) 40 MG tablet   Oral   Take 40 mg by mouth daily.          [name] glyBURIDE (DIABETA) 2.5 MG tablet   Oral   Take 5 mg by mouth 2 (two) times daily with a meal.         . hydrochlorothiazide (MICROZIDE) 12.5 MG capsule   Oral   Take 12.5 mg by mouth daily.           . metFORMIN (GLUCOPHAGE) 500 MG tablet   Oral   Take 1,000 mg by mouth 2 (two) times daily with a meal.          . metoprolol (TOPROL-XL) 50 MG 24 hr tablet   Oral   Take 50 mg by mouth daily.           . trandolapril (MAVIK) 4 MG tablet   Oral   Take 1 tablet (4 mg total) by mouth daily.   30 tablet   6   . warfarin (COUMADIN) 7.5 MG tablet   Oral   Take 1 tablet (7.5 mg total) by mouth as directed.   34 tablet   3      BP 169/54  Resp 16  SpO2 95%  Physical Exam  Nursing note and vitals reviewed. Constitutional: He is oriented to person, place, and time. He appears well-developed and well-nourished.  HENT:  Head: Normocephalic and atraumatic.  Eyes: EOM are normal.  Neck: Normal range of motion.  Cardiovascular: Normal rate, regular rhythm, normal heart sounds and intact distal pulses.   Pulmonary/Chest: Effort normal and breath sounds normal. No respiratory distress.  Abdominal: Soft. He exhibits no distension. There is no tenderness.  Genitourinary: Rectum normal.  Musculoskeletal: Normal range of motion.  Neurological: He is alert and oriented to person, place, and time.  Intermittent expressive aphasia with some dysarthria.  Right-sided facial droop.  Normal strength in his upper and lower extremity major muscle groups.  NIH stroke scale is 3  Skin: Skin is warm and dry.  Psychiatric: He has a normal mood and affect. Judgment normal.    ED Course  Procedures (including critical care time)   Date: 05/05/2013  Rate: 67  Rhythm: normal sinus rhythm  QRS Axis: normal  Intervals: normal  ST/T Wave abnormalities: normal  Conduction Disutrbances: Right bundle branch block  Narrative Interpretation:   Old EKG Reviewed: No significant changes noted  CRITICAL CARE Performed by: Lyanne Co Total critical care time: 35 Critical care time was exclusive of separately billable procedures and treating other patients. Critical care was necessary to treat or prevent imminent or life-threatening deterioration. Critical care was time spent personally by me on the following activities: development of treatment plan with patient and/or surrogate as well as nursing, discussions with consultants, evaluation of patient's response to treatment, examination of patient, obtaining history from patient or surrogate, ordering and performing treatments and interventions, ordering and review of laboratory  studies, ordering and review of radiographic studies, pulse oximetry and re-evaluation of patient's condition.    Labs Reviewed  PROTIME-INR - Abnormal; Notable for the following:    Prothrombin Time 17.9 (*)    INR 1.52 (*)  All other components within normal limits  COMPREHENSIVE METABOLIC PANEL - Abnormal; Notable for the following:    Glucose, Bld 172 (*)    All other components within normal limits  POCT I-STAT, CHEM 8 - Abnormal; Notable for the following:    Glucose, Bld 163 (*)    All other components within normal limits  APTT  CBC  DIFFERENTIAL  TROPONIN I   Ct Head Wo Contrast  05/05/2013   *RADIOLOGY REPORT*  Clinical Data: Code stroke.  Facial droop right.  Aphasia  CT HEAD WITHOUT CONTRAST  Technique:  Contiguous axial images were obtained from the base of the skull through the vertex without contrast.  Comparison: CT 09/03/2009  Findings: Mild atrophy.  Chronic infarct head of the caudate on the left.  Mild chronic microvascular ischemic change in the white matter.  These have progressed since the prior study.  No acute infarct.  Negative for hemorrhage or mass lesion.  Negative for hydrocephalus or midline shift.  Atherosclerotic calcification in the carotid vertebral arteries. No skull lesion.  IMPRESSION: Atrophy and chronic microvascular ischemia.  No acute abnormality.  Critical Value/emergent results were called by telephone at the time of interpretation on 05/05/2013 at 1442 hours to Dr. Patria Mane, who verbally acknowledged these results.   Original Report Authenticated By: Janeece Riggers, M.D.   I personally reviewed the imaging tests through PACS system I reviewed available ER/hospitalization records through the EMR   1. Stroke       MDM  Last normal was 1 PM today.  Good stroke called on arrival the emergency department.  Right-sided facial droop and intermittent expressive aphasia with some associated dysarthria.  No weakness of his upper extremity.  Stroke team  evaluating at the bedside.  CT head without acute abnormalities.  3:34 PM Patient has now developed a dense expressive aphasia, I spoke with neurology who will dose the patient with TPA at this time.  TPA to be dosed by neurology and pharmacy.      Lyanne Co, MD 05/05/13 [phone]

## 2013-05-05 NOTE — ED Notes (Signed)
Dr. Patria Mane in room to assess pt for Code Stroke.

## 2013-05-05 NOTE — Progress Notes (Signed)
ANTICOAGULATION CONSULT NOTE - Initial Consult  Pharmacy Consult for heparin + coumadin Indication: stroke and mechanical valve  No Known Allergies  Patient Measurements: Height: 5\' 8"  (172.7 cm) Weight: 183 lb 8 oz (83.235 kg) IBW/kg (Calculated) : 68.4 Heparin Dosing Weight: 83.2kg  Vital Signs: Temp: 98.1 F (36.7 C) (05/30 1747) BP: 158/57 mmHg (05/30 1745) Pulse Rate: 72 (05/30 1745)  Labs:  Recent Labs  05/05/13 1441 05/05/13 1453  HGB 15.3 16.0  HCT 42.8 47.0  PLT 202  --   APTT 33  --   LABPROT 17.9*  --   INR 1.52*  --   CREATININE 0.70 0.80  TROPONINI <0.30  --     Estimated Creatinine Clearance: 94.2 ml/min (by C-G formula based on Cr of 0.8).   Medical History: Past Medical History  Diagnosis Date  . Diabetes mellitus   . Hypertension   . Hyperlipidemia   . ITP (idiopathic thrombocytopenic purpura)   . Coronary artery disease     s/p cabg x 3 11/2003: lima-lad, seq vg to rpda and rpl  . Aortic stenosis     s/p st. jude mechanical AVR - Chronic Coumadin  . Claudication   . Diverticulitis of colon   . Hx of colonic polyps   . Peripheral vascular disease     s/p Left external Iliac Artery stenting and subsequent left femoral endarterectomy 02/2011- post op course complicated by wound infxn req I&D 03/2011  . Bronchitis 12/17/11    "just gettin over a case"  . Anemia   . Blood transfusion     Medications:  Prescriptions prior to admission  Medication Sig Dispense Refill  . amLODipine (NORVASC) 5 MG tablet Take 5 mg by mouth daily.       [name] amoxicillin (AMOXIL) 500 MG capsule Take 2,000 mg by mouth once. Taken prior to dental visits      . atorvastatin (LIPITOR) 40 MG tablet Take 40 mg by mouth daily.       [name] glyBURIDE (DIABETA) 2.5 MG tablet Take 5 mg by mouth 2 (two) times daily with a meal.      . hydrochlorothiazide (MICROZIDE) 12.5 MG capsule Take 12.5 mg by mouth daily.        . metFORMIN (GLUCOPHAGE) 500 MG tablet Take 1,000 mg by mouth 2  (two) times daily with a meal.       . metoprolol (TOPROL-XL) 50 MG 24 hr tablet Take 50 mg by mouth daily.        . trandolapril (MAVIK) 4 MG tablet Take 1 tablet (4 mg total) by mouth daily.  30 tablet  6  . warfarin (COUMADIN) 7.5 MG tablet Take 1 tablet (7.5 mg total) by mouth as directed.  34 tablet  3    Assessment: 66 yom on chronic coumadin for a heart valve presented to the ED with aphasia. TPA was not given but INR is low at 1.52. Pt reports that he hasn't had his INR checked in 2 months. To start IV heparin and continue coumadin for history of a heart valve. CBC is WNL.  Goal of Therapy:  INR 2-3 Heparin level 0.3-0.5 Monitor platelets by anticoagulation protocol: Yes   Plan:  1. Give additional warfarin 5mg  PO x 1 tonight 2. Daily INR 3. Heparin gtt 1000 units/hr - NO BOLUS 4. Daily HL and CBC 5. Check an 8 hour HL  Lailany Enoch, Drake Leach 05/05/2013,6:36 PM

## 2013-05-05 NOTE — ED Notes (Signed)
Dr. Amada Jupiter at bedside, aware of worsening symptoms.

## 2013-05-05 NOTE — ED Notes (Signed)
Pt finished with MRI. Transported by this RN back to room.

## 2013-05-06 ENCOUNTER — Inpatient Hospital Stay (HOSPITAL_COMMUNITY): Payer: Medicare Other

## 2013-05-06 DIAGNOSIS — I7409 Other arterial embolism and thrombosis of abdominal aorta: Secondary | ICD-10-CM | POA: Diagnosis not present

## 2013-05-06 DIAGNOSIS — IMO0001 Reserved for inherently not codable concepts without codable children: Secondary | ICD-10-CM | POA: Diagnosis not present

## 2013-05-06 DIAGNOSIS — I359 Nonrheumatic aortic valve disorder, unspecified: Secondary | ICD-10-CM | POA: Diagnosis not present

## 2013-05-06 DIAGNOSIS — R918 Other nonspecific abnormal finding of lung field: Secondary | ICD-10-CM | POA: Diagnosis not present

## 2013-05-06 DIAGNOSIS — I6350 Cerebral infarction due to unspecified occlusion or stenosis of unspecified cerebral artery: Secondary | ICD-10-CM | POA: Diagnosis not present

## 2013-05-06 LAB — CBC
Hemoglobin: 15.2 g/dL (ref 13.0–17.0)
MCHC: 34.9 g/dL (ref 30.0–36.0)
RDW: 14.1 % (ref 11.5–15.5)
WBC: 6.9 10*3/uL (ref 4.0–10.5)

## 2013-05-06 LAB — HEPARIN LEVEL (UNFRACTIONATED)
Heparin Unfractionated: <0.1 IU/mL — ABNORMAL LOW (ref 0.30–0.70)
Heparin Unfractionated: 0.24 IU/mL — ABNORMAL LOW (ref 0.30–0.70)
Heparin Unfractionated: 0.43 IU/mL (ref 0.30–0.70)

## 2013-05-06 LAB — GLUCOSE, CAPILLARY
Glucose-Capillary: 159 mg/dL — ABNORMAL HIGH (ref 70–99)
Glucose-Capillary: 274 mg/dL — ABNORMAL HIGH (ref 70–99)

## 2013-05-06 LAB — HEMOGLOBIN A1C
Hgb A1c MFr Bld: 6.5 % — ABNORMAL HIGH (ref <5.7)
Mean Plasma Glucose: 140 mg/dL — ABNORMAL HIGH (ref <117)

## 2013-05-06 LAB — LIPID PANEL
LDL Cholesterol: 62 mg/dL (ref 0–99)
Triglycerides: 121 mg/dL (ref <150)

## 2013-05-06 LAB — PROTIME-INR
INR: 1.65 — ABNORMAL HIGH (ref 0.00–1.49)
Prothrombin Time: 19 seconds — ABNORMAL HIGH (ref 11.6–15.2)

## 2013-05-06 MED ORDER — INSULIN ASPART 100 UNIT/ML ~~LOC~~ SOLN
0.0000 [IU] | Freq: Three times a day (TID) | SUBCUTANEOUS | Status: DC
  Administered 2013-05-06: 5 [IU] via SUBCUTANEOUS
  Administered 2013-05-07 (×2): 2 [IU] via SUBCUTANEOUS
  Administered 2013-05-07 – 2013-05-08 (×3): 3 [IU] via SUBCUTANEOUS

## 2013-05-06 MED ORDER — INSULIN DETEMIR 100 UNIT/ML ~~LOC~~ SOLN
10.0000 [IU] | Freq: Every day | SUBCUTANEOUS | Status: DC
  Administered 2013-05-06 – 2013-05-07 (×2): 10 [IU] via SUBCUTANEOUS
  Filled 2013-05-06 (×3): qty 0.1

## 2013-05-06 MED ORDER — INSULIN ASPART 100 UNIT/ML ~~LOC~~ SOLN: 0.0000 [IU] | Freq: Every day | SUBCUTANEOUS | Status: DC

## 2013-05-06 MED ORDER — WARFARIN SODIUM 10 MG PO TABS
10.0000 mg | ORAL_TABLET | Freq: Once | ORAL | Status: AC
  Administered 2013-05-06: 10 mg via ORAL
  Filled 2013-05-06: qty 1

## 2013-05-06 NOTE — Evaluation (Signed)
Occupational Therapy Evaluation Patient Details Name: [name] [name] MRN: [id number] DOB: 1946/10/13 Today's Date: 05/06/2013 Time: 1478-2956 OT Time Calculation (min): 18 min  OT Assessment / Plan / Recommendation Clinical Impression  Pt admitted to [facility] with slurred speech and right facial droop.  His MRI Brain showed punctate infarcts int he left frontal lobe.   Overall, pt is near baseline and is presenting at Independent/mod I level.  Very minor LOB while ambulating but was able to self correct. Pt has no further acute OT needs. Education completed. Signing off.    OT Assessment  Patient does not need any further OT services    Follow Up Recommendations  No OT follow up    Barriers to Discharge      Equipment Recommendations  None recommended by OT    Recommendations for Other Services    Frequency       Precautions / Restrictions     Pertinent Vitals/Pain See vitals    ADL  Grooming: Performed;Wash/dry hands;Independent Where Assessed - Grooming: Unsupported standing Upper Body Bathing: Simulated;Independent Where Assessed - Upper Body Bathing: Unsupported sitting Lower Body Bathing: Simulated;Independent Where Assessed - Lower Body Bathing: Unsupported sit to stand Upper Body Dressing: Performed;Independent Where Assessed - Upper Body Dressing: Unsupported sitting Lower Body Dressing: Performed;Independent Where Assessed - Lower Body Dressing: Unsupported sit to stand Toilet Transfer: Simulated;Modified independent Toilet Transfer Method:  (ambulating) Acupuncturist:  (chair) Tub/Shower Transfer: Simulated;Modified independent Tub/Shower Transfer Method: Science writer: Walk in shower Equipment Used: Gait belt Transfers/Ambulation Related to ADLs: mod I for ambulation in room and in hall.  Occasional minor LOB to left side (2-3x) but not significant and pt able to self-correct. ADL Comments: Pt at baseline with ADLs.  Educated pt on stroke signs and symptoms.    OT Diagnosis:    OT Problem List:   OT Treatment Interventions:     OT Goals    Visit Information  Last OT Received On: 05/06/13 Assistance Needed: +1 PT/OT Co-Evaluation/Treatment: Yes    Subjective Data      Prior Functioning     Home Living Lives With: Alone Available Help at Discharge: Available PRN/intermittently;Friend(s);Family Type of Home: House Home Access: Stairs to enter Secretary/administrator of Steps: 12 Entrance Stairs-Rails: Right Home Layout: One level Bathroom Shower/Tub: Health visitor: Standard Home Adaptive Equipment: Built-in shower seat Prior Function Level of Independence: Independent Able to Take Stairs?: Yes Driving: Yes Vocation: Retired Comments: likes to Personnel officer: Other (comment) (no obvious difficulties) Dominant Hand: Right         Vision/Perception Vision - History Baseline Vision: No visual deficits Patient Visual Report: No change from baseline   Cognition  Cognition Arousal/Alertness: Awake/alert Behavior During Therapy: WFL for tasks assessed/performed Overall Cognitive Status: Within Functional Limits for tasks assessed    Extremity/Trunk Assessment Right Upper Extremity Assessment RUE ROM/Strength/Tone: Within functional levels RUE Sensation: WFL - Light Touch;WFL - Proprioception RUE Coordination: WFL - gross/fine motor Left Upper Extremity Assessment LUE ROM/Strength/Tone: Within functional levels LUE Sensation: WFL - Light Touch;WFL - Proprioception LUE Coordination: WFL - gross/fine motor Right Lower Extremity Assessment RLE ROM/Strength/Tone: Within functional levels RLE Sensation: WFL - Light Touch Left Lower Extremity Assessment LLE ROM/Strength/Tone: Within functional levels LLE Sensation: WFL - Light Touch     Mobility Bed Mobility Bed Mobility: Not assessed Details for Bed Mobility Assistance: pt up in  chair Transfers Transfers: Sit to Stand;Stand to Sit Sit to Stand: 6: Modified independent (Device/Increase  time);From chair/3-in-1;With armrests;With upper extremity assist Stand to Sit: 6: Modified independent (Device/Increase time);To chair/3-in-1;With armrests;With upper extremity assist Details for Transfer Assistance: used arms to get to standing.       Exercise     Balance Dynamic Gait Index Level Surface: Normal Change in Gait Speed: Normal Gait with Horizontal Head Turns: Mild Impairment Gait with Vertical Head Turns: Normal Gait and Pivot Turn: Normal Step Over Obstacle: Normal Step Around Obstacles: Normal Steps: Mild Impairment Total Score: 22   End of Session OT - End of Session Equipment Utilized During Treatment: Gait belt Activity Tolerance: Patient tolerated treatment well Patient left: in chair;with call bell/phone within reach Nurse Communication: Mobility status  GO    05/06/2013 Cipriano Mile OTR/L Pager [phone] Office [phone]  Cipriano Mile 05/06/2013, 4:13 PM

## 2013-05-06 NOTE — Progress Notes (Signed)
ANTICOAGULATION CONSULT NOTE  Pharmacy Consult for heparin/Coumadin Indication: stroke and mechanical valve  No Known Allergies  Patient Measurements: Height: 5\' 8"  (172.7 cm) Weight: 181 lb 6.4 oz (82.283 kg) IBW/kg (Calculated) : 68.4 Heparin Dosing Weight: 83.2kg  Vital Signs: Temp: 98.2 F (36.8 C) (05/31 0504) Temp src: Oral (05/30 2253) BP: 179/43 mmHg (05/31 0504) Pulse Rate: 63 (05/31 0504)  Labs:  Recent Labs  05/05/13 1441 05/05/13 1453 05/06/13 0425  HGB 15.3 16.0 15.2  HCT 42.8 47.0 43.6  PLT 202  --  211  APTT 33  --   --   LABPROT 17.9*  --  19.0*  INR 1.52*  --  1.65*  HEPARINUNFRC  --   --  <0.10*  CREATININE 0.70 0.80  --   TROPONINI <0.30  --   --     Estimated Creatinine Clearance: 93.8 ml/min (by C-G formula based on Cr of 0.8).  Assessment: 67 yo male with CVA, h/o St. Jude's AVR, for anticoagulation  Goal of Therapy:  INR 2-3 Heparin level 0.3-0.5 Monitor platelets by anticoagulation protocol: Yes   Plan:  Increase Heparin 1300 units/hr Check heparin level in 8 hours. Coumadin 10 mg tonight  Eddie Candle 05/06/2013,5:50 AM

## 2013-05-06 NOTE — Progress Notes (Signed)
ANTICOAGULATION CONSULT NOTE  Pharmacy Consult for heparin/Coumadin Indication: stroke and mechanical valve  No Known Allergies  Patient Measurements: Height: 5\' 8"  (172.7 cm) Weight: 181 lb 6.4 oz (82.283 kg) IBW/kg (Calculated) : 68.4 Heparin Dosing Weight: 83.2kg  Vital Signs: Temp: 98.1 F (36.7 C) (05/31 1400) Temp src: Oral (05/31 1400) BP: 179/57 mmHg (05/31 1400) Pulse Rate: 67 (05/31 1400)  Labs:  Recent Labs  05/05/13 1441 05/05/13 1453 05/06/13 0425 05/06/13 1434  HGB 15.3 16.0 15.2  --   HCT 42.8 47.0 43.6  --   PLT 202  --  211  --   APTT 33  --   --   --   LABPROT 17.9*  --  19.0*  --   INR 1.52*  --  1.65*  --   HEPARINUNFRC  --   --  <0.10* 0.24*  CREATININE 0.70 0.80  --   --   TROPONINI <0.30  --   --   --     Estimated Creatinine Clearance: 93.8 ml/min (by C-G formula based on Cr of 0.8).  Assessment: 67 yo male admitted with CVA, h/o St. Jude's AVR, on chronic Coumadin PTA. At admit, pt reports that he hasn't had his INR checked in 2 months. Heparin continues while INR is below goal- heparin level is subtherapeutic inspite dose increase this AM. H/H and plts are stable, no bleeding reported.   Goal of Therapy:  Heparin level 0.3-0.5 Monitor platelets by anticoagulation protocol: Yes   Plan:  Increase Heparin to 1500 units/hr Re-check heparin level in 8 hours. Will f/up daily heparin level and CBC  Thanks, Wilhelmine Krogstad K. Allena Katz, PharmD, BCPS.  Clinical Pharmacist Pager [phone]. 05/06/2013 3:24 PM

## 2013-05-06 NOTE — Evaluation (Signed)
Speech Language Pathology Evaluation Patient Details Name: [name] [name] MRN: [id number] DOB: 1946/08/25 Today's Date: 05/06/2013 Time: 1145-1200 SLP Time Calculation (min): 15 min  Problem List:  Patient Active Problem List   Diagnosis Date Noted  . CVA (cerebral infarction) 05/05/2013  . Aortoiliac occlusive disease 01/10/2013  . PVD (peripheral vascular disease) with claudication 02/02/2012  . Atherosclerosis of native arteries of the extremities with intermittent claudication 01/12/2012  . Coronary artery disease   . Aortic stenosis   . Peripheral vascular disease   . Claudication   . Hypertension   . Hyperlipidemia   . ITP (idiopathic thrombocytopenic purpura)   . CAD 10/21/2010  . TIA 10/10/2009  . DIABETES MELLITUS 10/22/2008  . HYPERCHOLESTEROLEMIA 10/22/2008  . Immune thrombocytopenic purpura 10/22/2008  . HYPERTENSION 10/22/2008  . AORTIC STENOSIS 10/22/2008  . CLAUDICATION 10/22/2008  . DIVERTICULITIS, COLON 10/22/2008  . COLONIC POLYPS, HX OF 10/22/2008   Past Medical History:  Past Medical History  Diagnosis Date  . Diabetes mellitus   . Hypertension   . Hyperlipidemia   . ITP (idiopathic thrombocytopenic purpura)   . Coronary artery disease     s/p cabg x 3 11/2003: lima-lad, seq vg to rpda and rpl  . Aortic stenosis     s/p st. jude mechanical AVR - Chronic Coumadin  . Claudication   . Diverticulitis of colon   . Hx of colonic polyps   . Peripheral vascular disease     s/p Left external Iliac Artery stenting and subsequent left femoral endarterectomy 02/2011- post op course complicated by wound infxn req I&D 03/2011  . Bronchitis 12/17/11    "just gettin over a case"  . Anemia   . Blood transfusion    Past Surgical History:  Past Surgical History  Procedure Laterality Date  . Splenectomy  ~ 2003  . Coronary artery bypass graft    . Aortic valve replacement  ~ 2004  . Angioplasty / stenting iliac      Left external Iliac Artery  .  Coronary angioplasty with stent placement  12/16/11    "1"  . Tonsillectomy  ~ 1952  . Cardiac valve replacement      aortic  . Aorta - bilateral femoral artery bypass graft  01/18/2012    Procedure: AORTA BIFEMORAL BYPASS GRAFT;  Surgeon: Gretta Began, MD;  Location: [facility] OR;  Service: Vascular;  Laterality: N/A;   HPI:  [name] [name] is a 67 y.o. male with AVR, hypertension , DM, came in for slurred speech and right facial droop. He was called in as code stroke, but as his symptoms were improving, tpa was not given. His initial CT was not significant. His MRI Brain showed punctate infarcts int he left frontal lobe. Neurology was consulted in ED, recommended admission to medical service and stroke work up.    Assessment / Plan / Recommendation Clinical Impression  Cognitive Linguistic Evaluation completed per Stroke Protocol.  No speech and language deficits noted.  Cognitive status judged to be baseline.  No further Speech and Language Pathology Services warranted.  ST to sign off as education complete.     SLP Assessment  Patient does not need any further Speech Lanaguage Pathology Services    Follow Up Recommendations  None        SLP Evaluation Prior Functioning  Cognitive/Linguistic Baseline: Within functional limits Type of Home: House Lives With: Alone Education: [employer] Vocation: Retired   IT consultant  Overall Cognitive Status: Within Functional Limits for tasks assessed Arousal/Alertness:  Awake/alert Orientation Level: Oriented X4 Memory: Appears intact Awareness: Appears intact Problem Solving: Appears intact Safety/Judgment: Appears intact    Comprehension  Auditory Comprehension Overall Auditory Comprehension: Appears within functional limits for tasks assessed Visual Recognition/Discrimination Discrimination: Within Function Limits Reading Comprehension Reading Status: Within funtional limits    Expression Expression Primary Mode of Expression:  Verbal Verbal Expression Overall Verbal Expression: Appears within functional limits for tasks assessed   Oral / Motor Oral Motor/Sensory Function Overall Oral Motor/Sensory Function: Appears within functional limits for tasks assessed Motor Speech Overall Motor Speech: Appears within functional limits for tasks assessed   GO    Moreen Fowler MS, CCC-SLP [phone] [facility] 05/06/2013, 12:19 PM

## 2013-05-06 NOTE — Evaluation (Signed)
Physical Therapy Evaluation Patient Details Name: [name] [name] MRN: [id number] DOB: 1946-09-16 Today's Date: 05/06/2013 Time: 4098-1191 PT Time Calculation (min): 18 min  PT Assessment / Plan / Recommendation Clinical Impression  67 y.o. male admitted to [facility] with slurred speech and right facial droop.  His MRI Brain showed punctate infarcts int he left frontal lobe.  He presents today with very mild balance defiicits during gait that he independently compensates.  These will likely resolve on their own with increased mobility.  All stroke education completed and pt has no further acute or f/u PT needs at this time.      PT Assessment  Patent does not need any further PT services    Follow Up Recommendations  No PT follow up    Does the patient have the potential to tolerate intense rehabilitation     NA  Barriers to Discharge   none      Equipment Recommendations  None recommended by PT     Recommendations for Other Services   none         Pertinent Vitals/Pain See vitals flow sheet.       Mobility  Transfers Transfers: Sit to Stand;Stand to Sit Sit to Stand: 6: Modified independent (Device/Increase time);With armrests;From chair/3-in-1 Stand to Sit: 6: Modified independent (Device/Increase time);With upper extremity assist;With armrests;To chair/3-in-1 Details for Transfer Assistance: used arms to get to standing.   Ambulation/Gait Ambulation/Gait Assistance: 6: Modified independent (Device/Increase time) Ambulation Distance (Feet): 400 Feet Assistive device: None Ambulation/Gait Assistance Details: although he did have 2-3 mild LOB during gait they were very small and did not require extermnal assistance to correct.   Gait Pattern: Step-through pattern Gait velocity: WNL Stairs: Yes Stairs Assistance: 6: Modified independent (Device/Increase time) Stair Management Technique: One rail Right;Alternating pattern;Forwards Number of Stairs: 5 (this was as far  as his IV would let him go) Modified Rankin (Stroke Patients Only) Pre-Morbid Rankin Score: No symptoms Modified Rankin: No significant disability      PT Goals Acute Rehab PT Goals PT Goal Formulation: With patient  Visit Information  Last PT Received On: 05/06/13 Assistance Needed: +1 PT/OT Co-Evaluation/Treatment: Yes    Subjective Data  Subjective: Pt reports his speech seems to be the primary lingering deficit.   Patient Stated Goal: to get back to normal go home.     Prior Functioning  Home Living Lives With: Alone Available Help at Discharge: Available PRN/intermittently;Friend(s);Family Type of Home: House Home Access: Stairs to enter Secretary/administrator of Steps: 12 Entrance Stairs-Rails: Right Home Layout: One level Bathroom Shower/Tub: Health visitor: Standard Home Adaptive Equipment: Built-in shower seat Prior Function Level of Independence: Independent Able to Take Stairs?: Yes Driving: Yes Vocation: Retired Comments: likes to Personnel officer: Other (comment) (no obvious difficulties)    Cognition  Cognition Arousal/Alertness: Awake/alert Behavior During Therapy: WFL for tasks assessed/performed Overall Cognitive Status: Within Functional Limits for tasks assessed    Extremity/Trunk Assessment Right Lower Extremity Assessment RLE ROM/Strength/Tone: Within functional levels RLE Sensation: WFL - Light Touch Left Lower Extremity Assessment LLE ROM/Strength/Tone: Within functional levels LLE Sensation: WFL - Light Touch   Balance Dynamic Gait Index Level Surface: Normal Change in Gait Speed: Normal Gait with Horizontal Head Turns: Mild Impairment Gait with Vertical Head Turns: Normal Gait and Pivot Turn: Normal Step Over Obstacle: Normal Step Around Obstacles: Normal Steps: Mild Impairment Total Score: 22  End of Session PT - End of Session Activity Tolerance: Patient tolerated treatment well Patient  left: in chair;with call bell/phone within reach Nurse Communication: Mobility status;Other (comment) (ok for pt to walk hallways by himself )       Lurena Joiner B. Aryelle Figg, PT, DPT [phone]   05/06/2013, 3:31 PM

## 2013-05-06 NOTE — Progress Notes (Signed)
  Echocardiogram 2D Echocardiogram has been performed.  [name] [name] [name] [name] 05/06/2013, 11:49 AM

## 2013-05-06 NOTE — Progress Notes (Signed)
ANTICOAGULATION CONSULT NOTE  Pharmacy Consult for heparin Indication: stroke and mechanical valve  No Known Allergies  Patient Measurements: Height: 5\' 8"  (172.7 cm) Weight: 181 lb 6.4 oz (82.283 kg) IBW/kg (Calculated) : 68.4 Heparin Dosing Weight: 83.2kg  Vital Signs: Temp: 98.8 F (37.1 C) (05/31 2200) Temp src: Oral (05/31 1636) BP: 191/53 mmHg (05/31 2200) Pulse Rate: 59 (05/31 2200)  Labs:  Recent Labs  05/05/13 1441 05/05/13 1453 05/06/13 0425 05/06/13 1434 05/06/13 2310  HGB 15.3 16.0 15.2  --   --   HCT 42.8 47.0 43.6  --   --   PLT 202  --  211  --   --   APTT 33  --   --   --   --   LABPROT 17.9*  --  19.0*  --   --   INR 1.52*  --  1.65*  --   --   HEPARINUNFRC  --   --  <0.10* 0.24* 0.43  CREATININE 0.70 0.80  --   --   --   TROPONINI <0.30  --   --   --   --     Estimated Creatinine Clearance: 93.8 ml/min (by C-G formula based on Cr of 0.8).  Assessment: 67 yo male admitted with CVA, h/o St. Jude's AVR, for heparin  Goal of Therapy:  Heparin level 0.3-0.5 Monitor platelets by anticoagulation protocol: Yes   Plan:  Continue Heparin at current rate Follow-up am labs.  Geannie Risen, PharmD, BCPS  05/06/2013 11:39 PM

## 2013-05-06 NOTE — Progress Notes (Signed)
[facility] PROGRESS NOTE  [name] [name] XBJ:[id number] DOB: 1945/12/18 DOA: 05/05/2013 PCP: Gwen Pounds, MD  Assessment/Plan: Acute CVA;  - Punctate infarct in L frontal cortex - on IV heparin and coumadin as per pharmacy.  - echo and duplex of the carotid arteries pending - lipids controlled  - pT/OT eval.  - neuro on board.   Hypertension: permissive hypertension. -Cont b blocker.   Diabetes mellitus: -hgba1c pending -SSI ordered w/ low dose levemir  AVR: -subtherapeutic INR - On heparin w/ coumadin per Pharmacy.   Hyperlipidemia: -Controlled -Continue statin.    Code Status: Full Family Communication: Pt in room (indicate person spoken with, relationship, and if by phone, the number) Disposition Plan: Pending   Consultants:  Neurology   HPI/Subjective: Pt without complaints  Objective: Filed Vitals:   05/06/13 0254 05/06/13 0504 05/06/13 0659 05/06/13 1034  BP: 147/60 179/43 170/55 169/59  Pulse: 64 63 68 67  Temp: 98.1 F (36.7 C) 98.2 F (36.8 C) 98.2 F (36.8 C) 98.1 F (36.7 C)  TempSrc:      Resp: 16 16 18 18   Height:      Weight:      SpO2: 96% 98% 97% 98%    Intake/Output Summary (Last 24 hours) at 05/06/13 1315 Last data filed at 05/06/13 0700  Gross per 24 hour  Intake      0 ml  Output   2250 ml  Net  -2250 ml   Filed Weights   05/05/13 1452 05/05/13 2253  Weight: 83.235 kg (183 lb 8 oz) 82.283 kg (181 lb 6.4 oz)    Exam:   General:  Awake, in nad  Cardiovascular: regular, s1, s2  Respiratory: normal respiratory effort, no crackles or wheezing  Abdomen: soft, nondistended  Musculoskeletal: perfused, no clubbing   Data Reviewed: Basic Metabolic Panel:  Recent Labs Lab 05/05/13 1441 05/05/13 1453  NA 138 139  K 3.7 3.7  CL 100 103  CO2 28  --   GLUCOSE 172* 163*  BUN 13 12  CREATININE 0.70 0.80  CALCIUM 9.3  --    Liver Function Tests:  Recent Labs Lab 05/05/13 1441  AST 25  ALT 20   ALKPHOS 60  BILITOT 0.5  PROT 7.2  ALBUMIN 4.1   No results found for this basename: LIPASE, AMYLASE,  in the last 168 hours No results found for this basename: AMMONIA,  in the last 168 hours CBC:  Recent Labs Lab 05/05/13 1441 05/05/13 1453 05/06/13 0425  WBC 8.2  --  6.9  NEUTROABS 5.7  --   --   HGB 15.3 16.0 15.2  HCT 42.8 47.0 43.6  MCV 91.5  --  92.6  PLT 202  --  211   Cardiac Enzymes:  Recent Labs Lab 05/05/13 1441  TROPONINI <0.30   BNP (last 3 results) No results found for this basename: PROBNP,  in the last 8760 hours CBG:  Recent Labs Lab 05/05/13 2050 05/06/13 0636 05/06/13 1127  GLUCAP 198* 159* 274*    No results found for this or any previous visit (from the past 240 hour(s)).   Studies: Dg Chest 2 View  05/06/2013   *RADIOLOGY REPORT*  Clinical Data: History of aortic valve repair.  No current respiratory symptoms.  CHEST - 2 VIEW  Comparison: 01/19/2012  Findings: Changes from cardiac surgery and aortic valve replacement are noted.  The cardiac silhouette is mildly enlarged. The mediastinum is normal in contour caliber.  No hilar masses.  The lungs  are mildly hyperexpanded but clear.  The bony thorax is demineralized but intact.  IMPRESSION: No acute cardiopulmonary disease.   Original Report Authenticated By: Amie Portland, M.D.   Ct Head Wo Contrast  05/05/2013   *RADIOLOGY REPORT*  Clinical Data: Code stroke.  Facial droop right.  Aphasia  CT HEAD WITHOUT CONTRAST  Technique:  Contiguous axial images were obtained from the base of the skull through the vertex without contrast.  Comparison: CT 09/03/2009  Findings: Mild atrophy.  Chronic infarct head of the caudate on the left.  Mild chronic microvascular ischemic change in the white matter.  These have progressed since the prior study.  No acute infarct.  Negative for hemorrhage or mass lesion.  Negative for hydrocephalus or midline shift.  Atherosclerotic calcification in the carotid vertebral  arteries. No skull lesion.  IMPRESSION: Atrophy and chronic microvascular ischemia.  No acute abnormality.  Critical Value/emergent results were called by telephone at the time of interpretation on 05/05/2013 at 1442 hours to Dr. Patria Mane, who verbally acknowledged these results.   Original Report Authenticated By: Janeece Riggers, M.D.   Mr [facility] Wo Contrast  05/05/2013   *RADIOLOGY REPORT*  Clinical Data:  Intermittent symptoms of loss of balance and aphasia.  MRI HEAD WITHOUT AND WITH CONTRAST MRA HEAD WITHOUT CONTRAST MRA NECK WITHOUT AND WITH CONTRAST  Technique:  Multiplanar, multiecho pulse sequences of the brain and surrounding structures were obtained without and with intravenous contrast.  Angiographic images of the Circle of Willis were obtained using MRA technique without intravenous contrast. Angiographic images of the neck were obtained using MRA technique without and with intravenous contrast.  Carotid stenosis measurements (when applicable) are obtained utilizing NASCET criteria, using the distal internal carotid diameter as the denominator.  Contrast: 20mL MULTIHANCE GADOBENATE DIMEGLUMINE 529 MG/ML IV SOLN  Comparison:  CT head without contrast to 05/05/2013.  MRI HEAD  Findings:  A punctate area of restricted diffusion is evident in the posterior left frontal cortex, likely along the pre motor strip.  There is no associated hemorrhage or mass lesion.  No significant T2 signal abnormality is associated.  No other acute infarct is present.  There is no hemorrhage or mass lesion.  Remote lacunar infarcts are present in the cerebellum bilaterally.  Moderate generalized atrophy is advanced for age. Extensive periventricular subcortical white matter changes are noted bilaterally.  The globes and orbits are intact.  Flow is present in the major intracranial arteries.  Mild mucosal thickening is present in the anterior ethmoid air cells and frontal sinuses bilaterally.  The remaining paranasal sinuses are  clear. The mastoid air cells are clear.  IMPRESSION:  1.  Punctate non hemorrhagic infarcts in the posterior left frontal lobe, likely within the pre motor cortex. 2.  Moderate atrophy and generalized white matter disease is advanced for age.  This likely reflects the sequelae of chronic microvascular ischemia. 3.  Remote lacunar infarcts of the cerebellum bilaterally.  MRA HEAD  Findings: Mild atherosclerotic irregularity is present within the cavernous carotid arteries bilaterally.  There is no significant stenosis from the high cervical segments through the ICA termini. The A1 and M1 segments are within normal limits.  The MCA bifurcations are normal bilaterally.  Moderate distal segmental narrowing is present in the MCA branch vessels bilaterally.  The ACA branch vessels are unremarkable.  Atherosclerotic irregularity is present within the vertebral arteries bilaterally.  The left vertebral artery is the dominant vessel.  There is a 50% stenosis of the left vertebral artery  proximal to the PICA.  The left PICA origin is visualized and normal.  The right AICA is dominant.  The basilar artery is within the normal limits.  Both posterior cerebral arteries originate from the basilar tip.  Distal PCA branch vessel narrowing is evident bilaterally.  IMPRESSION:  1.  Intracranial atherosclerosis. 2.  Moderate small vessel disease, most evident in the MCA branch vessels bilaterally. 3.  Approximately 50% stenosis of the proximal left vertebral artery, proximal to the PICA. 4.  No significant proximal stenosis, aneurysm, or branch vessel occlusion.  MRA NECK  Findings: The time-of-flight images demonstrate no significant flow disturbance at either carotid bifurcation.  Flow is antegrade in the vertebral arteries bilaterally.  The postcontrast images demonstrate a standard three-vessel arch configuration.  The right subclavian artery is narrowed to less than 2 mm.  The distal right subclavian artery measures 6.5 mm. The  vertebral arteries both originate from the subclavian arteries. There is focal narrowing of both vertebral artery origins, less than 50%, relative to the more distal vessel.  No significant tandem stenoses are present within the neck.  The right common carotid artery is within normal limits.  There is mild irregularity of the carotid bifurcation without a significant stenosis.  The left common carotid artery is within normal limits.  The left carotid bifurcation is unremarkable.  The cervical left ICA is normal.  IMPRESSION:  1. 70% stenosis of the proximal right subclavian artery proximal to the vertebral artery origin. 2.  Mild stenoses of the vertebral artery origins bilaterally are less than 50% relative  to the distal vessels. 3.  Slight atherosclerotic irregularity of the right carotid bifurcation without a significant stenosis. 4.  No significant disease at the left carotid bifurcation.   Original Report Authenticated By: Marin Roberts, M.D.   Mr Angiogram Neck W Wo Contrast  05/05/2013   *RADIOLOGY REPORT*  Clinical Data:  Intermittent symptoms of loss of balance and aphasia.  MRI HEAD WITHOUT AND WITH CONTRAST MRA HEAD WITHOUT CONTRAST MRA NECK WITHOUT AND WITH CONTRAST  Technique:  Multiplanar, multiecho pulse sequences of the brain and surrounding structures were obtained without and with intravenous contrast.  Angiographic images of the Circle of Willis were obtained using MRA technique without intravenous contrast. Angiographic images of the neck were obtained using MRA technique without and with intravenous contrast.  Carotid stenosis measurements (when applicable) are obtained utilizing NASCET criteria, using the distal internal carotid diameter as the denominator.  Contrast: 20mL MULTIHANCE GADOBENATE DIMEGLUMINE 529 MG/ML IV SOLN  Comparison:  CT head without contrast to 05/05/2013.  MRI HEAD  Findings:  A punctate area of restricted diffusion is evident in the posterior left frontal  cortex, likely along the pre motor strip.  There is no associated hemorrhage or mass lesion.  No significant T2 signal abnormality is associated.  No other acute infarct is present.  There is no hemorrhage or mass lesion.  Remote lacunar infarcts are present in the cerebellum bilaterally.  Moderate generalized atrophy is advanced for age. Extensive periventricular subcortical white matter changes are noted bilaterally.  The globes and orbits are intact.  Flow is present in the major intracranial arteries.  Mild mucosal thickening is present in the anterior ethmoid air cells and frontal sinuses bilaterally.  The remaining paranasal sinuses are clear. The mastoid air cells are clear.  IMPRESSION:  1.  Punctate non hemorrhagic infarcts in the posterior left frontal lobe, likely within the pre motor cortex. 2.  Moderate atrophy and generalized white matter  disease is advanced for age.  This likely reflects the sequelae of chronic microvascular ischemia. 3.  Remote lacunar infarcts of the cerebellum bilaterally.  MRA HEAD  Findings: Mild atherosclerotic irregularity is present within the cavernous carotid arteries bilaterally.  There is no significant stenosis from the high cervical segments through the ICA termini. The A1 and M1 segments are within normal limits.  The MCA bifurcations are normal bilaterally.  Moderate distal segmental narrowing is present in the MCA branch vessels bilaterally.  The ACA branch vessels are unremarkable.  Atherosclerotic irregularity is present within the vertebral arteries bilaterally.  The left vertebral artery is the dominant vessel.  There is a 50% stenosis of the left vertebral artery proximal to the PICA.  The left PICA origin is visualized and normal.  The right AICA is dominant.  The basilar artery is within the normal limits.  Both posterior cerebral arteries originate from the basilar tip.  Distal PCA branch vessel narrowing is evident bilaterally.  IMPRESSION:  1.  Intracranial  atherosclerosis. 2.  Moderate small vessel disease, most evident in the MCA branch vessels bilaterally. 3.  Approximately 50% stenosis of the proximal left vertebral artery, proximal to the PICA. 4.  No significant proximal stenosis, aneurysm, or branch vessel occlusion.  MRA NECK  Findings: The time-of-flight images demonstrate no significant flow disturbance at either carotid bifurcation.  Flow is antegrade in the vertebral arteries bilaterally.  The postcontrast images demonstrate a standard three-vessel arch configuration.  The right subclavian artery is narrowed to less than 2 mm.  The distal right subclavian artery measures 6.5 mm. The vertebral arteries both originate from the subclavian arteries. There is focal narrowing of both vertebral artery origins, less than 50%, relative to the more distal vessel.  No significant tandem stenoses are present within the neck.  The right common carotid artery is within normal limits.  There is mild irregularity of the carotid bifurcation without a significant stenosis.  The left common carotid artery is within normal limits.  The left carotid bifurcation is unremarkable.  The cervical left ICA is normal.  IMPRESSION:  1. 70% stenosis of the proximal right subclavian artery proximal to the vertebral artery origin. 2.  Mild stenoses of the vertebral artery origins bilaterally are less than 50% relative  to the distal vessels. 3.  Slight atherosclerotic irregularity of the right carotid bifurcation without a significant stenosis. 4.  No significant disease at the left carotid bifurcation.   Original Report Authenticated By: Marin Roberts, M.D.   Mr Laqueta Jean Wo Contrast  05/05/2013   *RADIOLOGY REPORT*  Clinical Data:  Intermittent symptoms of loss of balance and aphasia.  MRI HEAD WITHOUT AND WITH CONTRAST MRA HEAD WITHOUT CONTRAST MRA NECK WITHOUT AND WITH CONTRAST  Technique:  Multiplanar, multiecho pulse sequences of the brain and surrounding structures were  obtained without and with intravenous contrast.  Angiographic images of the Circle of Willis were obtained using MRA technique without intravenous contrast. Angiographic images of the neck were obtained using MRA technique without and with intravenous contrast.  Carotid stenosis measurements (when applicable) are obtained utilizing NASCET criteria, using the distal internal carotid diameter as the denominator.  Contrast: 20mL MULTIHANCE GADOBENATE DIMEGLUMINE 529 MG/ML IV SOLN  Comparison:  CT head without contrast to 05/05/2013.  MRI HEAD  Findings:  A punctate area of restricted diffusion is evident in the posterior left frontal cortex, likely along the pre motor strip.  There is no associated hemorrhage or mass lesion.  No significant T2  signal abnormality is associated.  No other acute infarct is present.  There is no hemorrhage or mass lesion.  Remote lacunar infarcts are present in the cerebellum bilaterally.  Moderate generalized atrophy is advanced for age. Extensive periventricular subcortical white matter changes are noted bilaterally.  The globes and orbits are intact.  Flow is present in the major intracranial arteries.  Mild mucosal thickening is present in the anterior ethmoid air cells and frontal sinuses bilaterally.  The remaining paranasal sinuses are clear. The mastoid air cells are clear.  IMPRESSION:  1.  Punctate non hemorrhagic infarcts in the posterior left frontal lobe, likely within the pre motor cortex. 2.  Moderate atrophy and generalized white matter disease is advanced for age.  This likely reflects the sequelae of chronic microvascular ischemia. 3.  Remote lacunar infarcts of the cerebellum bilaterally.  MRA HEAD  Findings: Mild atherosclerotic irregularity is present within the cavernous carotid arteries bilaterally.  There is no significant stenosis from the high cervical segments through the ICA termini. The A1 and M1 segments are within normal limits.  The MCA bifurcations are  normal bilaterally.  Moderate distal segmental narrowing is present in the MCA branch vessels bilaterally.  The ACA branch vessels are unremarkable.  Atherosclerotic irregularity is present within the vertebral arteries bilaterally.  The left vertebral artery is the dominant vessel.  There is a 50% stenosis of the left vertebral artery proximal to the PICA.  The left PICA origin is visualized and normal.  The right AICA is dominant.  The basilar artery is within the normal limits.  Both posterior cerebral arteries originate from the basilar tip.  Distal PCA branch vessel narrowing is evident bilaterally.  IMPRESSION:  1.  Intracranial atherosclerosis. 2.  Moderate small vessel disease, most evident in the MCA branch vessels bilaterally. 3.  Approximately 50% stenosis of the proximal left vertebral artery, proximal to the PICA. 4.  No significant proximal stenosis, aneurysm, or branch vessel occlusion.  MRA NECK  Findings: The time-of-flight images demonstrate no significant flow disturbance at either carotid bifurcation.  Flow is antegrade in the vertebral arteries bilaterally.  The postcontrast images demonstrate a standard three-vessel arch configuration.  The right subclavian artery is narrowed to less than 2 mm.  The distal right subclavian artery measures 6.5 mm. The vertebral arteries both originate from the subclavian arteries. There is focal narrowing of both vertebral artery origins, less than 50%, relative to the more distal vessel.  No significant tandem stenoses are present within the neck.  The right common carotid artery is within normal limits.  There is mild irregularity of the carotid bifurcation without a significant stenosis.  The left common carotid artery is within normal limits.  The left carotid bifurcation is unremarkable.  The cervical left ICA is normal.  IMPRESSION:  1. 70% stenosis of the proximal right subclavian artery proximal to the vertebral artery origin. 2.  Mild stenoses of the  vertebral artery origins bilaterally are less than 50% relative  to the distal vessels. 3.  Slight atherosclerotic irregularity of the right carotid bifurcation without a significant stenosis. 4.  No significant disease at the left carotid bifurcation.   Original Report Authenticated By: Marin Roberts, M.D.    Scheduled Meds: . atorvastatin  40 mg Oral q1800  . insulin aspart  0-15 Units Subcutaneous TID WC  . insulin aspart  0-5 Units Subcutaneous QHS  . metoprolol succinate  50 mg Oral Daily  . warfarin  10 mg Oral ONCE-1800  . Warfarin -  Pharmacist Dosing Inpatient   Does not apply q1800   Continuous Infusions: . heparin 1,300 Units/hr (05/06/13 0616)    Active Problems:   DIABETES MELLITUS   HYPERCHOLESTEROLEMIA   Aortic stenosis   Hypertension   CVA (cerebral infarction)    Time spent:    Linnell Swords K  [facility] Pager [phone]. If 7PM-7AM, please contact night-coverage at [email or web address], password [facility] 05/06/2013, 1:15 PM  LOS: 1 day

## 2013-05-06 NOTE — Progress Notes (Signed)
Stroke Team Progress Note  HISTORY  [name] [name] is an 67 y.o. male white male with DM, HTN, HL, CAD s/p CABG, Aortic stenosis s/p St. Jude mechanical AVR on coumadin presenting to [facility] via EMS after experiencing sudden onset slurred speech and difficulty communicating while playing golf this afternoon. He claims symptoms started right around 1230pm and have improved since arrival to ED. He denies any similar prior episodes. He denies headache, vision changes or disturbance, N/V/D, or loss of consciousness.  Of note, NIH scale 4 in ED, mild symptoms noted with right facial droop, slurred speech, and right side decreased sensation. INR in ED 1.52.   Date last known well: Date: 05/05/2013  Time last known well: 12:30pm  tPA Given: No: PT >15 seconds, mild symptoms on arrival   SUBJECTIVE There are no family members present this morning. The patient feels back to baseline other than persistent mild dysarthria. He would like to increase his activity level.   OBJECTIVE Most recent Vital Signs: Filed Vitals:   05/06/13 0055 05/06/13 0254 05/06/13 0504 05/06/13 0659  BP: 161/56 147/60 179/43 170/55  Pulse: 66 64 63 68  Temp: 97.8 F (36.6 C) 98.1 F (36.7 C) 98.2 F (36.8 C) 98.2 F (36.8 C)  TempSrc:      Resp: 18 16 16 18   Height:      Weight:      SpO2: 97% 96% 98% 97%   CBG (last 3)   Recent Labs  05/05/13 2050 05/06/13 0636  GLUCAP 198* 159*    IV Fluid Intake:   . heparin 1,300 Units/hr (05/06/13 1610)    MEDICATIONS  . atorvastatin  40 mg Oral q1800  . metoprolol succinate  50 mg Oral Daily  . warfarin  10 mg Oral ONCE-1800  . Warfarin - Pharmacist Dosing Inpatient   Does not apply q1800   PRN:  senna-docusate  Diet:  Cardiac thin liquids Activity:  Bedrest - will change to up as tolerated. DVT Prophylaxis:  Heparin  CLINICALLY SIGNIFICANT STUDIES Basic Metabolic Panel:  Recent Labs Lab 05/05/13 1441 05/05/13 1453  NA 138 139  K 3.7 3.7  CL 100  103  CO2 28  --   GLUCOSE 172* 163*  BUN 13 12  CREATININE 0.70 0.80  CALCIUM 9.3  --    Liver Function Tests:  Recent Labs Lab 05/05/13 1441  AST 25  ALT 20  ALKPHOS 60  BILITOT 0.5  PROT 7.2  ALBUMIN 4.1   CBC:  Recent Labs Lab 05/05/13 1441 05/05/13 1453 05/06/13 0425  WBC 8.2  --  6.9  NEUTROABS 5.7  --   --   HGB 15.3 16.0 15.2  HCT 42.8 47.0 43.6  MCV 91.5  --  92.6  PLT 202  --  211   Coagulation:  Recent Labs Lab 05/05/13 1441 05/06/13 0425  LABPROT 17.9* 19.0*  INR 1.52* 1.65*   Cardiac Enzymes:  Recent Labs Lab 05/05/13 1441  TROPONINI <0.30   Urinalysis: No results found for this basename: COLORURINE, APPERANCEUR, LABSPEC, PHURINE, GLUCOSEU, HGBUR, BILIRUBINUR, KETONESUR, PROTEINUR, UROBILINOGEN, NITRITE, LEUKOCYTESUR,  in the last 168 hours Lipid Panel    Component Value Date/Time   CHOL 154 05/06/2013 0425   TRIG 121 05/06/2013 0425   HDL 68 05/06/2013 0425   CHOLHDL 2.3 05/06/2013 0425   VLDL 24 05/06/2013 0425   LDLCALC 62 05/06/2013 0425   HgbA1C  Lab Results  Component Value Date   HGBA1C 7.0* 01/18/2012    Urine Drug  Screen:   No results found for this basename: labopia, cocainscrnur, labbenz, amphetmu, thcu, labbarb    Alcohol Level: No results found for this basename: ETH,  in the last 168 hours  Dg Chest 2 View  05/06/2013   No acute cardiopulmonary disease.    Ct Head Wo Contrast  05/05/2013 Atrophy and chronic microvascular ischemia.  No acute abnormality.   MRI of Head 05/05/13 1. Punctate non hemorrhagic infarcts in the posterior left frontal  lobe, likely within the pre motor cortex.  2. Moderate atrophy and generalized white matter disease is  advanced for age. This likely reflects the sequelae of chronic  microvascular ischemia.  3. Remote lacunar infarcts of the cerebellum bilaterally.  MRA Head 5/ 30/14 1. Intracranial atherosclerosis.  2. Moderate small vessel disease, most evident in the MCA branch   vessels bilaterally.  3. Approximately 50% stenosis of the proximal left vertebral  artery, proximal to the PICA.  4. No significant proximal stenosis, aneurysm, or branch vessel  Occlusion.  MRA Neck 05/05/13 1. 70% stenosis of the proximal right subclavian artery proximal to  the vertebral artery origin.  2. Mild stenoses of the vertebral artery origins bilaterally are  less than 50% relative to the distal vessels.  3. Slight atherosclerotic irregularity of the right carotid  bifurcation without a significant stenosis.  4. No significant disease at the left carotid bifurcation.     2D Echocardiogram  pending  Carotid Doppler  canceled secondary to MRA of neck.  CXR   No acute cardiopulmonary disease.  EKG   sinus rhythm rate 74 beats per minute.  Therapy Recommendations evaluations pending  Physical Exam NEUROLOGIC:   MENTAL STATUS: awake, alert, oriented, language fluent,  follows simple commands. Mild dysarthria noted. CRANIAL NERVES: pupils equal and reactive to light,extraocular muscles intact, facial sensation and strength symmetric, uvula midlinec, tongue midline MOTOR: normal bulk and tone, Strength - SENSORY: normal and symmetric to light touch  COORDINATION: finger-nose-finger normal - heel to shin normal  REFLEXES: deep tendon reflexes present and symmetric - no babinski    ASSESSMENT [name] is a 67 y.o. male presenting with dysarthria and right facial droop. An MRI confirmed punctate non-hemorrhagic infarcts in the posterior left frontal lobe. Infarcts felt to be embolic secondary to subtherapeutic or firm level with a St. Jude's mechanical aortic valve replacement.  On warfarin prior to admission. Now on warfarin and heparin for secondary stroke prevention. Patient with resultant mild dysarthria. Work up underway.   Diabetes mellitus - hemoglobin A1c is 7. Better control is indicated.  Coronary artery disease S/P CABG with aortic valve  replacement.  Hypertension  Hyperlipidemia history - Lipitor prior to admission - cholesterol 154 LDL 62.  History of peripheral vascular disease  70% right subclavian artery stenosis.  Hospital day # 1  TREATMENT/PLAN  Continue IV heparin for secondary stroke prevention until warfarin is once again therapeutic.  Therapy evaluations  Await 2-D echo  Monitor right subclavian artery stenosis. May require stenting in the future.  Delton See PA-C [facility] Pager [phone] 05/06/2013, 10:19 AM I have personally obtained a history, examined the patient, evaluated imaging results, and formulated the assessment and plan of care. I agree with the above.  Echo completed:  - No cardiac source of embolism was identified.  Small infarct pt restarted on coumadin as well as coumadin.   70 % stenosis in the R sublclavian proximal to to R vertebral artery. The small stroke is in anterior  circulation so do not believe the subclavian stenosis contributed to the stroke. Pt can f/up with vascular surgery as out pt.   Stroke work up complete. Will sign off please call with questions.   Pauletta Browns

## 2013-05-07 DIAGNOSIS — I739 Peripheral vascular disease, unspecified: Secondary | ICD-10-CM

## 2013-05-07 DIAGNOSIS — E119 Type 2 diabetes mellitus without complications: Secondary | ICD-10-CM

## 2013-05-07 DIAGNOSIS — I359 Nonrheumatic aortic valve disorder, unspecified: Secondary | ICD-10-CM

## 2013-05-07 DIAGNOSIS — I10 Essential (primary) hypertension: Secondary | ICD-10-CM

## 2013-05-07 DIAGNOSIS — I6350 Cerebral infarction due to unspecified occlusion or stenosis of unspecified cerebral artery: Secondary | ICD-10-CM

## 2013-05-07 LAB — CBC
MCH: 32.5 pg (ref 26.0–34.0)
MCHC: 35.5 g/dL (ref 30.0–36.0)
MCV: 91.4 fL (ref 78.0–100.0)
Platelets: 202 10*3/uL (ref 150–400)
RDW: 14 % (ref 11.5–15.5)

## 2013-05-07 LAB — HEPARIN LEVEL (UNFRACTIONATED): Heparin Unfractionated: 0.22 IU/mL — ABNORMAL LOW (ref 0.30–0.70)

## 2013-05-07 LAB — GLUCOSE, CAPILLARY
Glucose-Capillary: 201 mg/dL — ABNORMAL HIGH (ref 70–99)
Glucose-Capillary: 86 mg/dL (ref 70–99)
Glucose-Capillary: 124 mg/dL — ABNORMAL HIGH (ref 70–99)

## 2013-05-07 LAB — PROTIME-INR: Prothrombin Time: 22 seconds — ABNORMAL HIGH (ref 11.6–15.2)

## 2013-05-07 MED ORDER — HYDRALAZINE HCL 20 MG/ML IJ SOLN
10.0000 mg | Freq: Once | INTRAMUSCULAR | Status: AC
  Administered 2013-05-07: 10 mg via INTRAVENOUS
  Filled 2013-05-07: qty 1

## 2013-05-07 MED ORDER — WARFARIN SODIUM 10 MG PO TABS
10.0000 mg | ORAL_TABLET | Freq: Once | ORAL | Status: AC
  Administered 2013-05-07: 10 mg via ORAL
  Filled 2013-05-07: qty 1

## 2013-05-07 NOTE — Progress Notes (Signed)
ANTICOAGULATION CONSULT NOTE  Pharmacy Consult for heparin/Coumadin Indication: AVR and secondary stroke px  No Known Allergies  Patient Measurements: Height: 5\' 8"  (172.7 cm) Weight: 181 lb 6.4 oz (82.283 kg) IBW/kg (Calculated) : 68.4 Heparin Dosing Weight: 83.2kg  Vital Signs: Temp: 98 F (36.7 C) (06/01 1000) Temp src: Oral (06/01 1000) BP: 138/56 mmHg (06/01 1000) Pulse Rate: 64 (06/01 1000)  Labs:  Recent Labs  05/05/13 1441 05/05/13 1453  05/06/13 0425 05/06/13 1434 05/06/13 2310 05/07/13 0535  HGB 15.3 16.0  --  15.2  --   --  14.8  HCT 42.8 47.0  --  43.6  --   --  41.7  PLT 202  --   --  211  --   --  202  APTT 33  --   --   --   --   --   --   LABPROT 17.9*  --   --  19.0*  --   --  22.0*  INR 1.52*  --   --  1.65*  --   --  2.01*  HEPARINUNFRC  --   --   < > <0.10* 0.24* 0.43 0.22*  CREATININE 0.70 0.80  --   --   --   --   --   TROPONINI <0.30  --   --   --   --   --   --   < > = values in this interval not displayed.  Estimated Creatinine Clearance: 93.8 ml/min (by C-G formula based on Cr of 0.8).  Assessment: 67 y/o male patient admitted with acute CVA, h/o St. Jude's AVR, on chronic Coumadin PTA. Heparin and coumadin started on admit until INR therapeutic. INR remains subtherapeutic this am but trending up. Heparin level is subtherapeutic, will increase gtt rate. H/H and plts are stable, no bleeding reported. Anticipate therapeutic INR in am.   Goal of Therapy:  INR= 2.5-3.5 Heparin level 0.3-0.5 Monitor platelets by anticoagulation protocol: Yes   Plan:  Increase Heparin to 1650 units/hr Repeat coumadin 10mg  today Will f/up daily heparin level, protime and CBC  Verlene Mayer, PharmD, [place] Pager [phone]  05/07/2013 10:33 AM

## 2013-05-07 NOTE — Progress Notes (Signed)
[facility] PROGRESS NOTE  [name] [name] YNW:[id number] DOB: 06/26/1946 DOA: 05/05/2013 PCP: Gwen Pounds, MD  Assessment/Plan: Acute CVA;  - Punctate infarct in L frontal cortex  - on IV heparin and coumadin as per pharmacy.  - echo unremarkalbe - carotid dopplers pending  - lipids controlled  - pT/OT eval.  - neuro on board.  Hypertension: permissive hypertension.  -Cont b blocker.  Diabetes mellitus:  -hgba1c pending  -SSI ordered w/ low dose levemir  AVR:  -subtherapeutic INR  - On heparin w/ coumadin per Pharmacy.  Hyperlipidemia:  -Controlled  -Continue statin.    Code Status: Full Family Communication: Pt in room (indicate person spoken with, relationship, and if by phone, the number) Disposition Plan: Pending   Consultants:  Neurology  HPI/Subjective: Pt is without complaints. Wants to go home  Objective: Filed Vitals:   05/07/13 0200 05/07/13 0600 05/07/13 1000 05/07/13 1407  BP: 179/86 152/52 138/56 155/39  Pulse: 67 67 64 65  Temp: 98.8 F (37.1 C) 98.8 F (37.1 C) 98 F (36.7 C) 97.7 F (36.5 C)  TempSrc:   Oral Oral  Resp: 18 18 20 18   Height:      Weight:      SpO2: 99% 94% 96% 100%   No intake or output data in the 24 hours ending 05/07/13 1706 Filed Weights   05/05/13 1452 05/05/13 2253  Weight: 83.235 kg (183 lb 8 oz) 82.283 kg (181 lb 6.4 oz)    Exam:   General:  Awake in NAD  Cardiovascular: regular, s1, s2  Respiratory: normal respiratory effort, no crackles or wheezing  Abdomen: soft, nondistended  Musculoskeletal: perfused, no clubbing   Data Reviewed: Basic Metabolic Panel:  Recent Labs Lab 05/05/13 1441 05/05/13 1453  NA 138 139  K 3.7 3.7  CL 100 103  CO2 28  --   GLUCOSE 172* 163*  BUN 13 12  CREATININE 0.70 0.80  CALCIUM 9.3  --    Liver Function Tests:  Recent Labs Lab 05/05/13 1441  AST 25  ALT 20  ALKPHOS 60  BILITOT 0.5  PROT 7.2  ALBUMIN 4.1   No results found for this  basename: LIPASE, AMYLASE,  in the last 168 hours No results found for this basename: AMMONIA,  in the last 168 hours CBC:  Recent Labs Lab 05/05/13 1441 05/05/13 1453 05/06/13 0425 05/07/13 0535  WBC 8.2  --  6.9 7.7  NEUTROABS 5.7  --   --   --   HGB 15.3 16.0 15.2 14.8  HCT 42.8 47.0 43.6 41.7  MCV 91.5  --  92.6 91.4  PLT 202  --  211 202   Cardiac Enzymes:  Recent Labs Lab 05/05/13 1441  TROPONINI <0.30   BNP (last 3 results) No results found for this basename: PROBNP,  in the last 8760 hours CBG:  Recent Labs Lab 05/06/13 1635 05/06/13 2221 05/07/13 0658 05/07/13 1202 05/07/13 1601  GLUCAP 201* 86 124* 169* 129*    No results found for this or any previous visit (from the past 240 hour(s)).   Studies: Dg Chest 2 View  05/06/2013   *RADIOLOGY REPORT*  Clinical Data: History of aortic valve repair.  No current respiratory symptoms.  CHEST - 2 VIEW  Comparison: 01/19/2012  Findings: Changes from cardiac surgery and aortic valve replacement are noted.  The cardiac silhouette is mildly enlarged. The mediastinum is normal in contour caliber.  No hilar masses.  The lungs are mildly hyperexpanded but clear.  The bony thorax is demineralized but intact.  IMPRESSION: No acute cardiopulmonary disease.   Original Report Authenticated By: Amie Portland, M.D.    Scheduled Meds: . atorvastatin  40 mg Oral q1800  . insulin aspart  0-15 Units Subcutaneous TID WC  . insulin aspart  0-5 Units Subcutaneous QHS  . insulin detemir  10 Units Subcutaneous QHS  . metoprolol succinate  50 mg Oral Daily  . warfarin  10 mg Oral ONCE-1800  . Warfarin - Pharmacist Dosing Inpatient   Does not apply q1800   Continuous Infusions: . heparin 1,650 Units/hr (05/07/13 1037)    Active Problems:   DIABETES MELLITUS   HYPERCHOLESTEROLEMIA   Aortic stenosis   Hypertension   CVA (cerebral infarction)    Time spent:    CHIU, STEPHEN K  [facility] Pager [phone]. If  7PM-7AM, please contact night-coverage at [email or web address], password [email or web address] 05/07/2013, 5:06 PM  LOS: 2 days

## 2013-05-08 DIAGNOSIS — I7409 Other arterial embolism and thrombosis of abdominal aorta: Secondary | ICD-10-CM | POA: Diagnosis not present

## 2013-05-08 DIAGNOSIS — I6350 Cerebral infarction due to unspecified occlusion or stenosis of unspecified cerebral artery: Secondary | ICD-10-CM | POA: Diagnosis not present

## 2013-05-08 DIAGNOSIS — I359 Nonrheumatic aortic valve disorder, unspecified: Secondary | ICD-10-CM | POA: Diagnosis not present

## 2013-05-08 LAB — CBC
HCT: 42.3 % (ref 39.0–52.0)
Hemoglobin: 14.5 g/dL (ref 13.0–17.0)
MCV: 93.2 fL (ref 78.0–100.0)
RBC: 4.54 MIL/uL (ref 4.22–5.81)
WBC: 6.3 10*3/uL (ref 4.0–10.5)

## 2013-05-08 LAB — GLUCOSE, CAPILLARY
Glucose-Capillary: 134 mg/dL — ABNORMAL HIGH (ref 70–99)
Glucose-Capillary: 163 mg/dL — ABNORMAL HIGH (ref 70–99)

## 2013-05-08 MED ORDER — WARFARIN SODIUM 7.5 MG PO TABS
7.5000 mg | ORAL_TABLET | Freq: Once | ORAL | Status: DC
  Filled 2013-05-08: qty 1

## 2013-05-08 NOTE — Progress Notes (Signed)
Patient is been discharged, assessments remained unchanged prior to discharged. Discharge and medication instructions given.

## 2013-05-08 NOTE — Discharge Summary (Signed)
Physician Discharge Summary  [name] [name] ZOX:[id number] DOB: 1946-01-07 DOA: 05/05/2013  PCP: Gwen Pounds, MD  Admit date: 05/05/2013 Discharge date: 05/08/2013  Time spent: 35 minutes  Recommendations for Outpatient Follow-up:  1. Continue coumadin with a target of around 2.5  Discharge Diagnoses:  Active Problems:   DIABETES MELLITUS   HYPERCHOLESTEROLEMIA   AORTIC STENOSIS   Aortic stenosis   Hypertension   PVD (peripheral vascular disease) with claudication   CVA (cerebral infarction)   Discharge Condition: Stable  Diet recommendation: Regular  Filed Weights   05/05/13 1452 05/05/13 2253  Weight: 83.235 kg (183 lb 8 oz) 82.283 kg (181 lb 6.4 oz)    History of present illness:  [name] [name] is a 67 y.o. male with AVR, hypertension , DM, came in for slurred speech and right facial droop. He was called in as code stroke, but as his symptoms were improving, tpa was not given. His initial CT was not significant. His MRI Brain showed punctate infarcts int he left frontal lobe. Neurology was consulted in ED, recommended admission to medical service and stroke work up.   Hospital Course:  The patient was admitted to the inpatient service where an MRI did demonstrate punctate infarct of the left frontal cortex. The patient was seen by PTOT as well speech pathology with no further recommendations given.per neurology, the patient was continued to continue on therapeutic Coumadin as tolerated. Of note, the patient was noted to have 70% stenosis of the right subclavian proximal to the right vertebral artery. Per neurology, the patient is recommended to followup with vascular surgery as an outpatient. Otherwise, the patient's INR was made therapeutic during hospital course.is otherwise medically stable for further followup.  Procedures:  2-D echocardiogram on 05/06/2013:no cardiac source of embolism identified, EF of 60-65%   Consultations:  Neurology  Discharge  Exam: Filed Vitals:   05/05/13 2253 05/08/13 0145 05/08/13 0654 05/08/13 0900  BP:  159/72 170/49 180/43  Pulse:  59 60 58  Temp:  98.4 F (36.9 C) 98.4 F (36.9 C) 97.8 F (36.6 C)  TempSrc:  Oral Oral Oral  Resp:  18 18 18   Height: 5\' 8"  (1.727 m)     Weight: 82.283 kg (181 lb 6.4 oz)     SpO2:  97% 96% 98%    General: Awake in NAD Cardiovascular: regular, s1, s2 Respiratory: normal resp effort, no crackles  Discharge Instructions     Medication List    ASK your doctor about these medications       amLODipine 5 MG tablet  Commonly known as:  NORVASC  Take 5 mg by mouth daily.     amoxicillin 500 MG capsule  Commonly known as:  AMOXIL  Take 2,000 mg by mouth once. Taken prior to dental visits     atorvastatin 40 MG tablet  Commonly known as:  LIPITOR  Take 40 mg by mouth daily.     glyBURIDE 2.5 MG tablet  Commonly known as:  DIABETA  Take 5 mg by mouth 2 (two) times daily with a meal.     hydrochlorothiazide 12.5 MG capsule  Commonly known as:  MICROZIDE  Take 12.5 mg by mouth daily.     metFORMIN 500 MG tablet  Commonly known as:  GLUCOPHAGE  Take 1,000 mg by mouth 2 (two) times daily with a meal.     metoprolol succinate 50 MG 24 hr tablet  Commonly known as:  TOPROL-XL  Take 50 mg by mouth daily.  trandolapril 4 MG tablet  Commonly known as:  MAVIK  Take 1 tablet (4 mg total) by mouth daily.     warfarin 7.5 MG tablet  Commonly known as:  COUMADIN  Take 1 tablet (7.5 mg total) by mouth as directed.       No Known Allergies    The results of significant diagnostics from this hospitalization (including imaging, microbiology, ancillary and laboratory) are listed below for reference.    Significant Diagnostic Studies: Dg Chest 2 View  05/06/2013   *RADIOLOGY REPORT*  Clinical Data: History of aortic valve repair.  No current respiratory symptoms.  CHEST - 2 VIEW  Comparison: 01/19/2012  Findings: Changes from cardiac surgery and aortic  valve replacement are noted.  The cardiac silhouette is mildly enlarged. The mediastinum is normal in contour caliber.  No hilar masses.  The lungs are mildly hyperexpanded but clear.  The bony thorax is demineralized but intact.  IMPRESSION: No acute cardiopulmonary disease.   Original Report Authenticated By: Amie Portland, M.D.   Ct Head Wo Contrast  05/05/2013   *RADIOLOGY REPORT*  Clinical Data: Code stroke.  Facial droop right.  Aphasia  CT HEAD WITHOUT CONTRAST  Technique:  Contiguous axial images were obtained from the base of the skull through the vertex without contrast.  Comparison: CT 09/03/2009  Findings: Mild atrophy.  Chronic infarct head of the caudate on the left.  Mild chronic microvascular ischemic change in the white matter.  These have progressed since the prior study.  No acute infarct.  Negative for hemorrhage or mass lesion.  Negative for hydrocephalus or midline shift.  Atherosclerotic calcification in the carotid vertebral arteries. No skull lesion.  IMPRESSION: Atrophy and chronic microvascular ischemia.  No acute abnormality.  Critical Value/emergent results were called by telephone at the time of interpretation on 05/05/2013 at 1442 hours to Dr. Patria Mane, who verbally acknowledged these results.   Original Report Authenticated By: Janeece Riggers, M.D.   Mr [facility] Wo Contrast  05/05/2013   *RADIOLOGY REPORT*  Clinical Data:  Intermittent symptoms of loss of balance and aphasia.  MRI HEAD WITHOUT AND WITH CONTRAST MRA HEAD WITHOUT CONTRAST MRA NECK WITHOUT AND WITH CONTRAST  Technique:  Multiplanar, multiecho pulse sequences of the brain and surrounding structures were obtained without and with intravenous contrast.  Angiographic images of the Circle of Willis were obtained using MRA technique without intravenous contrast. Angiographic images of the neck were obtained using MRA technique without and with intravenous contrast.  Carotid stenosis measurements (when applicable) are obtained  utilizing NASCET criteria, using the distal internal carotid diameter as the denominator.  Contrast: 20mL MULTIHANCE GADOBENATE DIMEGLUMINE 529 MG/ML IV SOLN  Comparison:  CT head without contrast to 05/05/2013.  MRI HEAD  Findings:  A punctate area of restricted diffusion is evident in the posterior left frontal cortex, likely along the pre motor strip.  There is no associated hemorrhage or mass lesion.  No significant T2 signal abnormality is associated.  No other acute infarct is present.  There is no hemorrhage or mass lesion.  Remote lacunar infarcts are present in the cerebellum bilaterally.  Moderate generalized atrophy is advanced for age. Extensive periventricular subcortical white matter changes are noted bilaterally.  The globes and orbits are intact.  Flow is present in the major intracranial arteries.  Mild mucosal thickening is present in the anterior ethmoid air cells and frontal sinuses bilaterally.  The remaining paranasal sinuses are clear. The mastoid air cells are clear.  IMPRESSION:  1.  Punctate non hemorrhagic infarcts in the posterior left frontal lobe, likely within the pre motor cortex. 2.  Moderate atrophy and generalized white matter disease is advanced for age.  This likely reflects the sequelae of chronic microvascular ischemia. 3.  Remote lacunar infarcts of the cerebellum bilaterally.  MRA HEAD  Findings: Mild atherosclerotic irregularity is present within the cavernous carotid arteries bilaterally.  There is no significant stenosis from the high cervical segments through the ICA termini. The A1 and M1 segments are within normal limits.  The MCA bifurcations are normal bilaterally.  Moderate distal segmental narrowing is present in the MCA branch vessels bilaterally.  The ACA branch vessels are unremarkable.  Atherosclerotic irregularity is present within the vertebral arteries bilaterally.  The left vertebral artery is the dominant vessel.  There is a 50% stenosis of the left  vertebral artery proximal to the PICA.  The left PICA origin is visualized and normal.  The right AICA is dominant.  The basilar artery is within the normal limits.  Both posterior cerebral arteries originate from the basilar tip.  Distal PCA branch vessel narrowing is evident bilaterally.  IMPRESSION:  1.  Intracranial atherosclerosis. 2.  Moderate small vessel disease, most evident in the MCA branch vessels bilaterally. 3.  Approximately 50% stenosis of the proximal left vertebral artery, proximal to the PICA. 4.  No significant proximal stenosis, aneurysm, or branch vessel occlusion.  MRA NECK  Findings: The time-of-flight images demonstrate no significant flow disturbance at either carotid bifurcation.  Flow is antegrade in the vertebral arteries bilaterally.  The postcontrast images demonstrate a standard three-vessel arch configuration.  The right subclavian artery is narrowed to less than 2 mm.  The distal right subclavian artery measures 6.5 mm. The vertebral arteries both originate from the subclavian arteries. There is focal narrowing of both vertebral artery origins, less than 50%, relative to the more distal vessel.  No significant tandem stenoses are present within the neck.  The right common carotid artery is within normal limits.  There is mild irregularity of the carotid bifurcation without a significant stenosis.  The left common carotid artery is within normal limits.  The left carotid bifurcation is unremarkable.  The cervical left ICA is normal.  IMPRESSION:  1. 70% stenosis of the proximal right subclavian artery proximal to the vertebral artery origin. 2.  Mild stenoses of the vertebral artery origins bilaterally are less than 50% relative  to the distal vessels. 3.  Slight atherosclerotic irregularity of the right carotid bifurcation without a significant stenosis. 4.  No significant disease at the left carotid bifurcation.   Original Report Authenticated By: Marin Roberts, M.D.   Mr  Angiogram Neck W Wo Contrast  05/05/2013   *RADIOLOGY REPORT*  Clinical Data:  Intermittent symptoms of loss of balance and aphasia.  MRI HEAD WITHOUT AND WITH CONTRAST MRA HEAD WITHOUT CONTRAST MRA NECK WITHOUT AND WITH CONTRAST  Technique:  Multiplanar, multiecho pulse sequences of the brain and surrounding structures were obtained without and with intravenous contrast.  Angiographic images of the Circle of Willis were obtained using MRA technique without intravenous contrast. Angiographic images of the neck were obtained using MRA technique without and with intravenous contrast.  Carotid stenosis measurements (when applicable) are obtained utilizing NASCET criteria, using the distal internal carotid diameter as the denominator.  Contrast: 20mL MULTIHANCE GADOBENATE DIMEGLUMINE 529 MG/ML IV SOLN  Comparison:  CT head without contrast to 05/05/2013.  MRI HEAD  Findings:  A punctate area of restricted diffusion is evident  in the posterior left frontal cortex, likely along the pre motor strip.  There is no associated hemorrhage or mass lesion.  No significant T2 signal abnormality is associated.  No other acute infarct is present.  There is no hemorrhage or mass lesion.  Remote lacunar infarcts are present in the cerebellum bilaterally.  Moderate generalized atrophy is advanced for age. Extensive periventricular subcortical white matter changes are noted bilaterally.  The globes and orbits are intact.  Flow is present in the major intracranial arteries.  Mild mucosal thickening is present in the anterior ethmoid air cells and frontal sinuses bilaterally.  The remaining paranasal sinuses are clear. The mastoid air cells are clear.  IMPRESSION:  1.  Punctate non hemorrhagic infarcts in the posterior left frontal lobe, likely within the pre motor cortex. 2.  Moderate atrophy and generalized white matter disease is advanced for age.  This likely reflects the sequelae of chronic microvascular ischemia. 3.  Remote lacunar  infarcts of the cerebellum bilaterally.  MRA HEAD  Findings: Mild atherosclerotic irregularity is present within the cavernous carotid arteries bilaterally.  There is no significant stenosis from the high cervical segments through the ICA termini. The A1 and M1 segments are within normal limits.  The MCA bifurcations are normal bilaterally.  Moderate distal segmental narrowing is present in the MCA branch vessels bilaterally.  The ACA branch vessels are unremarkable.  Atherosclerotic irregularity is present within the vertebral arteries bilaterally.  The left vertebral artery is the dominant vessel.  There is a 50% stenosis of the left vertebral artery proximal to the PICA.  The left PICA origin is visualized and normal.  The right AICA is dominant.  The basilar artery is within the normal limits.  Both posterior cerebral arteries originate from the basilar tip.  Distal PCA branch vessel narrowing is evident bilaterally.  IMPRESSION:  1.  Intracranial atherosclerosis. 2.  Moderate small vessel disease, most evident in the MCA branch vessels bilaterally. 3.  Approximately 50% stenosis of the proximal left vertebral artery, proximal to the PICA. 4.  No significant proximal stenosis, aneurysm, or branch vessel occlusion.  MRA NECK  Findings: The time-of-flight images demonstrate no significant flow disturbance at either carotid bifurcation.  Flow is antegrade in the vertebral arteries bilaterally.  The postcontrast images demonstrate a standard three-vessel arch configuration.  The right subclavian artery is narrowed to less than 2 mm.  The distal right subclavian artery measures 6.5 mm. The vertebral arteries both originate from the subclavian arteries. There is focal narrowing of both vertebral artery origins, less than 50%, relative to the more distal vessel.  No significant tandem stenoses are present within the neck.  The right common carotid artery is within normal limits.  There is mild irregularity of the  carotid bifurcation without a significant stenosis.  The left common carotid artery is within normal limits.  The left carotid bifurcation is unremarkable.  The cervical left ICA is normal.  IMPRESSION:  1. 70% stenosis of the proximal right subclavian artery proximal to the vertebral artery origin. 2.  Mild stenoses of the vertebral artery origins bilaterally are less than 50% relative  to the distal vessels. 3.  Slight atherosclerotic irregularity of the right carotid bifurcation without a significant stenosis. 4.  No significant disease at the left carotid bifurcation.   Original Report Authenticated By: Marin Roberts, M.D.   Mr Laqu[facility] Wo Contrast  05/05/2013   *RADIOLOGY REPORT*  Clinical Data:  Intermittent symptoms of loss of balance and aphasia.  MRI HEAD WITHOUT AND WITH CONTRAST MRA HEAD WITHOUT CONTRAST MRA NECK WITHOUT AND WITH CONTRAST  Technique:  Multiplanar, multiecho pulse sequences of the brain and surrounding structures were obtained without and with intravenous contrast.  Angiographic images of the Circle of Willis were obtained using MRA technique without intravenous contrast. Angiographic images of the neck were obtained using MRA technique without and with intravenous contrast.  Carotid stenosis measurements (when applicable) are obtained utilizing NASCET criteria, using the distal internal carotid diameter as the denominator.  Contrast: 20mL MULTIHANCE GADOBENATE DIMEGLUMINE 529 MG/ML IV SOLN  Comparison:  CT head without contrast to 05/05/2013.  MRI HEAD  Findings:  A punctate area of restricted diffusion is evident in the posterior left frontal cortex, likely along the pre motor strip.  There is no associated hemorrhage or mass lesion.  No significant T2 signal abnormality is associated.  No other acute infarct is present.  There is no hemorrhage or mass lesion.  Remote lacunar infarcts are present in the cerebellum bilaterally.  Moderate generalized atrophy is advanced for age.  Extensive periventricular subcortical white matter changes are noted bilaterally.  The globes and orbits are intact.  Flow is present in the major intracranial arteries.  Mild mucosal thickening is present in the anterior ethmoid air cells and frontal sinuses bilaterally.  The remaining paranasal sinuses are clear. The mastoid air cells are clear.  IMPRESSION:  1.  Punctate non hemorrhagic infarcts in the posterior left frontal lobe, likely within the pre motor cortex. 2.  Moderate atrophy and generalized white matter disease is advanced for age.  This likely reflects the sequelae of chronic microvascular ischemia. 3.  Remote lacunar infarcts of the cerebellum bilaterally.  MRA HEAD  Findings: Mild atherosclerotic irregularity is present within the cavernous carotid arteries bilaterally.  There is no significant stenosis from the high cervical segments through the ICA termini. The A1 and M1 segments are within normal limits.  The MCA bifurcations are normal bilaterally.  Moderate distal segmental narrowing is present in the MCA branch vessels bilaterally.  The ACA branch vessels are unremarkable.  Atherosclerotic irregularity is present within the vertebral arteries bilaterally.  The left vertebral artery is the dominant vessel.  There is a 50% stenosis of the left vertebral artery proximal to the PICA.  The left PICA origin is visualized and normal.  The right AICA is dominant.  The basilar artery is within the normal limits.  Both posterior cerebral arteries originate from the basilar tip.  Distal PCA branch vessel narrowing is evident bilaterally.  IMPRESSION:  1.  Intracranial atherosclerosis. 2.  Moderate small vessel disease, most evident in the MCA branch vessels bilaterally. 3.  Approximately 50% stenosis of the proximal left vertebral artery, proximal to the PICA. 4.  No significant proximal stenosis, aneurysm, or branch vessel occlusion.  MRA NECK  Findings: The time-of-flight images demonstrate no  significant flow disturbance at either carotid bifurcation.  Flow is antegrade in the vertebral arteries bilaterally.  The postcontrast images demonstrate a standard three-vessel arch configuration.  The right subclavian artery is narrowed to less than 2 mm.  The distal right subclavian artery measures 6.5 mm. The vertebral arteries both originate from the subclavian arteries. There is focal narrowing of both vertebral artery origins, less than 50%, relative to the more distal vessel.  No significant tandem stenoses are present within the neck.  The right common carotid artery is within normal limits.  There is mild irregularity of the carotid bifurcation without a significant stenosis.  The left common carotid artery is within normal limits.  The left carotid bifurcation is unremarkable.  The cervical left ICA is normal.  IMPRESSION:  1. 70% stenosis of the proximal right subclavian artery proximal to the vertebral artery origin. 2.  Mild stenoses of the vertebral artery origins bilaterally are less than 50% relative  to the distal vessels. 3.  Slight atherosclerotic irregularity of the right carotid bifurcation without a significant stenosis. 4.  No significant disease at the left carotid bifurcation.   Original Report Authenticated By: Marin Roberts, M.D.    Microbiology: No results found for this or any previous visit (from the past 240 hour(s)).   Labs: Basic Metabolic Panel:  Recent Labs Lab 05/05/13 1441 05/05/13 1453  NA 138 139  K 3.7 3.7  CL 100 103  CO2 28  --   GLUCOSE 172* 163*  BUN 13 12  CREATININE 0.70 0.80  CALCIUM 9.3  --    Liver Function Tests:  Recent Labs Lab 05/05/13 1441  AST 25  ALT 20  ALKPHOS 60  BILITOT 0.5  PROT 7.2  ALBUMIN 4.1   No results found for this basename: LIPASE, AMYLASE,  in the last 168 hours No results found for this basename: AMMONIA,  in the last 168 hours CBC:  Recent Labs Lab 05/05/13 1441 05/05/13 1453 05/06/13 0425  05/07/13 0535 05/08/13 0524  WBC 8.2  --  6.9 7.7 6.3  NEUTROABS 5.7  --   --   --   --   HGB 15.3 16.0 15.2 14.8 14.5  HCT 42.8 47.0 43.6 41.7 42.3  MCV 91.5  --  92.6 91.4 93.2  PLT 202  --  211 202 202   Cardiac Enzymes:  Recent Labs Lab 05/05/13 1441  TROPONINI <0.30   BNP: BNP (last 3 results) No results found for this basename: PROBNP,  in the last 8760 hours CBG:  Recent Labs Lab 05/07/13 1601 05/07/13 2252 05/08/13 0629 05/08/13 0745 05/08/13 1123  GLUCAP 129* 156* 134* 163* 167*       Signed:  CHIU, STEPHEN K  [facility] 05/08/2013, 11:34 AM

## 2013-05-08 NOTE — Progress Notes (Signed)
ANTICOAGULATION CONSULT NOTE   Pharmacy Consult for heparin/Coumadin Indication: AVR and secondary stroke px  No Known Allergies  Patient Measurements: Height: 5\' 8"  (172.7 cm) Weight: 181 lb 6.4 oz (82.283 kg) IBW/kg (Calculated) : 68.4 Heparin Dosing Weight: 83.2kg  Vital Signs: Temp: 97.8 F (36.6 C) (06/02 0900) Temp src: Oral (06/02 0900) BP: 180/43 mmHg (06/02 0900) Pulse Rate: 58 (06/02 0900)  Labs:  Recent Labs  05/05/13 1441 05/05/13 1453 05/06/13 0425  05/06/13 2310 05/07/13 0535 05/08/13 0524  HGB 15.3 16.0 15.2  --   --  14.8 14.5  HCT 42.8 47.0 43.6  --   --  41.7 42.3  PLT 202  --  211  --   --  202 202  APTT 33  --   --   --   --   --   --   LABPROT 17.9*  --  19.0*  --   --  22.0* 25.1*  INR 1.52*  --  1.65*  --   --  2.01* 2.41*  HEPARINUNFRC  --   --  <0.10*  < > 0.43 0.22* 0.47  CREATININE 0.70 0.80  --   --   --   --   --   TROPONINI <0.30  --   --   --   --   --   --   < > = values in this interval not displayed.  Estimated Creatinine Clearance: 93.8 ml/min (by C-G formula based on Cr of 0.8).  Assessment: 67 y/o male patient admitted with acute CVA, h/o [employer] AVR, on chronic Coumadin PTA. Heparin and coumadin started on admit until INR therapeutic. INR remains subtherapeutic this am but trending up quickly. Heparin level is at goal. CBC is stable and no bleeding noted.   Goal of Therapy:  INR= 2.5-3.5 Heparin level 0.3-0.5 Monitor platelets by anticoagulation protocol: Yes   Plan:  1. Continue IV heparin at 1650units/hr 2. F/u AM heparin level 3. Coumadin 7.5mg  PO x 1 tonight 4. F/u AM INR  Lysle Pearl, PharmD, BCPS Pager # [phone] 05/08/2013 9:56 AM

## 2013-05-12 ENCOUNTER — Ambulatory Visit (INDEPENDENT_AMBULATORY_CARE_PROVIDER_SITE_OTHER): Payer: Medicare Other | Admitting: *Deleted

## 2013-05-12 DIAGNOSIS — G459 Transient cerebral ischemic attack, unspecified: Secondary | ICD-10-CM | POA: Diagnosis not present

## 2013-05-12 DIAGNOSIS — I359 Nonrheumatic aortic valve disorder, unspecified: Secondary | ICD-10-CM | POA: Diagnosis not present

## 2013-05-23 DIAGNOSIS — M7500 Adhesive capsulitis of unspecified shoulder: Secondary | ICD-10-CM | POA: Diagnosis not present

## 2013-05-23 DIAGNOSIS — M25519 Pain in unspecified shoulder: Secondary | ICD-10-CM | POA: Diagnosis not present

## 2013-05-26 ENCOUNTER — Ambulatory Visit (INDEPENDENT_AMBULATORY_CARE_PROVIDER_SITE_OTHER): Payer: Medicare Other | Admitting: *Deleted

## 2013-05-26 DIAGNOSIS — G459 Transient cerebral ischemic attack, unspecified: Secondary | ICD-10-CM

## 2013-05-26 DIAGNOSIS — I359 Nonrheumatic aortic valve disorder, unspecified: Secondary | ICD-10-CM | POA: Diagnosis not present

## 2013-05-26 LAB — POCT INR: INR: 4.6

## 2013-05-29 DIAGNOSIS — M25519 Pain in unspecified shoulder: Secondary | ICD-10-CM | POA: Diagnosis not present

## 2013-05-31 ENCOUNTER — Other Ambulatory Visit: Payer: Self-pay | Admitting: *Deleted

## 2013-05-31 DIAGNOSIS — M25519 Pain in unspecified shoulder: Secondary | ICD-10-CM | POA: Diagnosis not present

## 2013-05-31 MED ORDER — METOPROLOL SUCCINATE ER 50 MG PO TB24: 50.0000 mg | ORAL_TABLET | Freq: Every day | ORAL | Status: DC

## 2013-06-01 ENCOUNTER — Other Ambulatory Visit: Payer: Self-pay | Admitting: *Deleted

## 2013-06-01 MED ORDER — WARFARIN SODIUM 7.5 MG PO TABS: 7.5000 mg | ORAL_TABLET | ORAL | Status: DC

## 2013-06-06 DIAGNOSIS — M25519 Pain in unspecified shoulder: Secondary | ICD-10-CM | POA: Diagnosis not present

## 2013-06-07 DIAGNOSIS — Z954 Presence of other heart-valve replacement: Secondary | ICD-10-CM | POA: Diagnosis not present

## 2013-06-07 DIAGNOSIS — E1159 Type 2 diabetes mellitus with other circulatory complications: Secondary | ICD-10-CM | POA: Diagnosis not present

## 2013-06-07 DIAGNOSIS — M25519 Pain in unspecified shoulder: Secondary | ICD-10-CM | POA: Diagnosis not present

## 2013-06-07 DIAGNOSIS — Z6827 Body mass index (BMI) 27.0-27.9, adult: Secondary | ICD-10-CM | POA: Diagnosis not present

## 2013-06-07 DIAGNOSIS — I739 Peripheral vascular disease, unspecified: Secondary | ICD-10-CM | POA: Diagnosis not present

## 2013-06-07 DIAGNOSIS — E785 Hyperlipidemia, unspecified: Secondary | ICD-10-CM | POA: Diagnosis not present

## 2013-06-07 DIAGNOSIS — I10 Essential (primary) hypertension: Secondary | ICD-10-CM | POA: Diagnosis not present

## 2013-06-07 DIAGNOSIS — I6350 Cerebral infarction due to unspecified occlusion or stenosis of unspecified cerebral artery: Secondary | ICD-10-CM | POA: Diagnosis not present

## 2013-06-08 ENCOUNTER — Ambulatory Visit (INDEPENDENT_AMBULATORY_CARE_PROVIDER_SITE_OTHER): Payer: Medicare Other | Admitting: *Deleted

## 2013-06-08 DIAGNOSIS — G459 Transient cerebral ischemic attack, unspecified: Secondary | ICD-10-CM | POA: Diagnosis not present

## 2013-06-08 DIAGNOSIS — I359 Nonrheumatic aortic valve disorder, unspecified: Secondary | ICD-10-CM

## 2013-06-12 DIAGNOSIS — I10 Essential (primary) hypertension: Secondary | ICD-10-CM | POA: Diagnosis not present

## 2013-06-12 DIAGNOSIS — M25519 Pain in unspecified shoulder: Secondary | ICD-10-CM | POA: Diagnosis not present

## 2013-06-12 DIAGNOSIS — E785 Hyperlipidemia, unspecified: Secondary | ICD-10-CM | POA: Diagnosis not present

## 2013-06-12 DIAGNOSIS — I739 Peripheral vascular disease, unspecified: Secondary | ICD-10-CM | POA: Diagnosis not present

## 2013-06-12 DIAGNOSIS — Z954 Presence of other heart-valve replacement: Secondary | ICD-10-CM | POA: Diagnosis not present

## 2013-06-12 DIAGNOSIS — I2510 Atherosclerotic heart disease of native coronary artery without angina pectoris: Secondary | ICD-10-CM | POA: Diagnosis not present

## 2013-06-12 DIAGNOSIS — I6350 Cerebral infarction due to unspecified occlusion or stenosis of unspecified cerebral artery: Secondary | ICD-10-CM | POA: Diagnosis not present

## 2013-06-12 DIAGNOSIS — E1159 Type 2 diabetes mellitus with other circulatory complications: Secondary | ICD-10-CM | POA: Diagnosis not present

## 2013-06-20 DIAGNOSIS — M25519 Pain in unspecified shoulder: Secondary | ICD-10-CM | POA: Diagnosis not present

## 2013-06-22 ENCOUNTER — Ambulatory Visit (INDEPENDENT_AMBULATORY_CARE_PROVIDER_SITE_OTHER): Payer: Medicare Other | Admitting: *Deleted

## 2013-06-22 DIAGNOSIS — G459 Transient cerebral ischemic attack, unspecified: Secondary | ICD-10-CM | POA: Diagnosis not present

## 2013-06-22 DIAGNOSIS — I359 Nonrheumatic aortic valve disorder, unspecified: Secondary | ICD-10-CM

## 2013-06-27 DIAGNOSIS — M25519 Pain in unspecified shoulder: Secondary | ICD-10-CM | POA: Diagnosis not present

## 2013-07-06 ENCOUNTER — Telehealth: Payer: Self-pay | Admitting: *Deleted

## 2013-07-06 MED ORDER — HYDROCHLOROTHIAZIDE 12.5 MG PO CAPS: 12.5000 mg | ORAL_CAPSULE | Freq: Every day | ORAL | Status: DC

## 2013-07-06 NOTE — Telephone Encounter (Signed)
Refill encounter ?

## 2013-07-13 ENCOUNTER — Ambulatory Visit (INDEPENDENT_AMBULATORY_CARE_PROVIDER_SITE_OTHER): Payer: Medicare Other

## 2013-07-13 DIAGNOSIS — G459 Transient cerebral ischemic attack, unspecified: Secondary | ICD-10-CM

## 2013-07-13 DIAGNOSIS — I359 Nonrheumatic aortic valve disorder, unspecified: Secondary | ICD-10-CM

## 2013-07-13 LAB — POCT INR: INR: 2.8

## 2013-07-24 DIAGNOSIS — S335XXA Sprain of ligaments of lumbar spine, initial encounter: Secondary | ICD-10-CM | POA: Diagnosis not present

## 2013-07-24 DIAGNOSIS — Z6827 Body mass index (BMI) 27.0-27.9, adult: Secondary | ICD-10-CM | POA: Diagnosis not present

## 2013-07-24 DIAGNOSIS — I10 Essential (primary) hypertension: Secondary | ICD-10-CM | POA: Diagnosis not present

## 2013-07-25 DIAGNOSIS — M47817 Spondylosis without myelopathy or radiculopathy, lumbosacral region: Secondary | ICD-10-CM | POA: Diagnosis not present

## 2013-08-10 ENCOUNTER — Ambulatory Visit (INDEPENDENT_AMBULATORY_CARE_PROVIDER_SITE_OTHER): Payer: Medicare Other

## 2013-08-10 DIAGNOSIS — G459 Transient cerebral ischemic attack, unspecified: Secondary | ICD-10-CM | POA: Diagnosis not present

## 2013-08-10 DIAGNOSIS — I359 Nonrheumatic aortic valve disorder, unspecified: Secondary | ICD-10-CM | POA: Diagnosis not present

## 2013-08-10 LAB — POCT INR: INR: 2.9

## 2013-09-04 ENCOUNTER — Other Ambulatory Visit: Payer: Self-pay | Admitting: Cardiovascular Disease

## 2013-09-06 ENCOUNTER — Other Ambulatory Visit: Payer: Self-pay | Admitting: Cardiovascular Disease

## 2013-09-07 ENCOUNTER — Ambulatory Visit (INDEPENDENT_AMBULATORY_CARE_PROVIDER_SITE_OTHER): Payer: Medicare Other | Admitting: General Practice

## 2013-09-07 DIAGNOSIS — I359 Nonrheumatic aortic valve disorder, unspecified: Secondary | ICD-10-CM | POA: Diagnosis not present

## 2013-09-07 DIAGNOSIS — G459 Transient cerebral ischemic attack, unspecified: Secondary | ICD-10-CM | POA: Diagnosis not present

## 2013-10-05 ENCOUNTER — Ambulatory Visit (INDEPENDENT_AMBULATORY_CARE_PROVIDER_SITE_OTHER): Payer: Medicare Other | Admitting: *Deleted

## 2013-10-05 DIAGNOSIS — I359 Nonrheumatic aortic valve disorder, unspecified: Secondary | ICD-10-CM

## 2013-10-05 DIAGNOSIS — G459 Transient cerebral ischemic attack, unspecified: Secondary | ICD-10-CM | POA: Diagnosis not present

## 2013-10-05 LAB — POCT INR: INR: 3.1

## 2013-10-09 ENCOUNTER — Other Ambulatory Visit: Payer: Self-pay | Admitting: Cardiovascular Disease

## 2013-10-10 ENCOUNTER — Telehealth: Payer: Self-pay | Admitting: *Deleted

## 2013-10-10 DIAGNOSIS — Z1211 Encounter for screening for malignant neoplasm of colon: Secondary | ICD-10-CM

## 2013-10-10 NOTE — Telephone Encounter (Signed)
Dr Jarold Motto has decided to do an IFOB test on pt before scheduling his Recall Colon. Mailed pt a letter requesting he do the IFOB.

## 2013-10-11 ENCOUNTER — Other Ambulatory Visit: Payer: Self-pay | Admitting: Cardiovascular Disease

## 2013-11-01 ENCOUNTER — Other Ambulatory Visit (INDEPENDENT_AMBULATORY_CARE_PROVIDER_SITE_OTHER): Payer: Medicare Other

## 2013-11-01 DIAGNOSIS — Z1211 Encounter for screening for malignant neoplasm of colon: Secondary | ICD-10-CM | POA: Diagnosis not present

## 2013-11-01 LAB — FECAL OCCULT BLOOD, IMMUNOCHEMICAL: Fecal Occult Bld: NEGATIVE

## 2013-11-05 ENCOUNTER — Other Ambulatory Visit: Payer: Self-pay | Admitting: Cardiovascular Disease

## 2013-11-07 ENCOUNTER — Ambulatory Visit (INDEPENDENT_AMBULATORY_CARE_PROVIDER_SITE_OTHER): Payer: Medicare Other | Admitting: General Practice

## 2013-11-07 DIAGNOSIS — I359 Nonrheumatic aortic valve disorder, unspecified: Secondary | ICD-10-CM

## 2013-11-07 DIAGNOSIS — G459 Transient cerebral ischemic attack, unspecified: Secondary | ICD-10-CM | POA: Diagnosis not present

## 2013-11-09 ENCOUNTER — Other Ambulatory Visit: Payer: Self-pay | Admitting: Cardiovascular Disease

## 2013-11-23 DIAGNOSIS — L570 Actinic keratosis: Secondary | ICD-10-CM | POA: Diagnosis not present

## 2013-11-23 DIAGNOSIS — L821 Other seborrheic keratosis: Secondary | ICD-10-CM | POA: Diagnosis not present

## 2013-11-24 DIAGNOSIS — Z23 Encounter for immunization: Secondary | ICD-10-CM | POA: Diagnosis not present

## 2013-12-06 ENCOUNTER — Other Ambulatory Visit: Payer: Self-pay | Admitting: Cardiovascular Disease

## 2013-12-07 DIAGNOSIS — I639 Cerebral infarction, unspecified: Secondary | ICD-10-CM

## 2013-12-07 HISTORY — DX: Cerebral infarction, unspecified: I63.9

## 2013-12-08 DIAGNOSIS — E119 Type 2 diabetes mellitus without complications: Secondary | ICD-10-CM | POA: Diagnosis not present

## 2013-12-08 DIAGNOSIS — I10 Essential (primary) hypertension: Secondary | ICD-10-CM | POA: Diagnosis not present

## 2013-12-08 DIAGNOSIS — I2510 Atherosclerotic heart disease of native coronary artery without angina pectoris: Secondary | ICD-10-CM | POA: Diagnosis not present

## 2013-12-08 DIAGNOSIS — Z125 Encounter for screening for malignant neoplasm of prostate: Secondary | ICD-10-CM | POA: Diagnosis not present

## 2013-12-08 DIAGNOSIS — E785 Hyperlipidemia, unspecified: Secondary | ICD-10-CM | POA: Diagnosis not present

## 2013-12-15 DIAGNOSIS — I10 Essential (primary) hypertension: Secondary | ICD-10-CM | POA: Diagnosis not present

## 2013-12-15 DIAGNOSIS — Z0000 Encounter for general adult medical examination without abnormal findings: Secondary | ICD-10-CM | POA: Diagnosis not present

## 2013-12-15 DIAGNOSIS — H9190 Unspecified hearing loss, unspecified ear: Secondary | ICD-10-CM | POA: Diagnosis not present

## 2013-12-15 DIAGNOSIS — M25519 Pain in unspecified shoulder: Secondary | ICD-10-CM | POA: Diagnosis not present

## 2013-12-15 DIAGNOSIS — E785 Hyperlipidemia, unspecified: Secondary | ICD-10-CM | POA: Diagnosis not present

## 2013-12-15 DIAGNOSIS — Z9089 Acquired absence of other organs: Secondary | ICD-10-CM | POA: Diagnosis not present

## 2013-12-15 DIAGNOSIS — E1159 Type 2 diabetes mellitus with other circulatory complications: Secondary | ICD-10-CM | POA: Diagnosis not present

## 2013-12-15 DIAGNOSIS — Z125 Encounter for screening for malignant neoplasm of prostate: Secondary | ICD-10-CM | POA: Diagnosis not present

## 2013-12-15 DIAGNOSIS — I6990 Unspecified sequelae of unspecified cerebrovascular disease: Secondary | ICD-10-CM | POA: Diagnosis not present

## 2013-12-27 ENCOUNTER — Encounter: Payer: Self-pay | Admitting: Cardiovascular Disease

## 2013-12-27 ENCOUNTER — Encounter (INDEPENDENT_AMBULATORY_CARE_PROVIDER_SITE_OTHER): Payer: Self-pay

## 2013-12-27 ENCOUNTER — Ambulatory Visit (INDEPENDENT_AMBULATORY_CARE_PROVIDER_SITE_OTHER): Payer: Medicare Other | Admitting: Cardiovascular Disease

## 2013-12-27 ENCOUNTER — Ambulatory Visit (INDEPENDENT_AMBULATORY_CARE_PROVIDER_SITE_OTHER): Payer: Medicare Other | Admitting: *Deleted

## 2013-12-27 VITALS — BP 122/72 | HR 68 | Ht 68.0 in | Wt 183.0 lb

## 2013-12-27 DIAGNOSIS — I10 Essential (primary) hypertension: Secondary | ICD-10-CM

## 2013-12-27 DIAGNOSIS — G459 Transient cerebral ischemic attack, unspecified: Secondary | ICD-10-CM

## 2013-12-27 DIAGNOSIS — I2510 Atherosclerotic heart disease of native coronary artery without angina pectoris: Secondary | ICD-10-CM

## 2013-12-27 DIAGNOSIS — I359 Nonrheumatic aortic valve disorder, unspecified: Secondary | ICD-10-CM

## 2013-12-27 DIAGNOSIS — I771 Stricture of artery: Secondary | ICD-10-CM

## 2013-12-27 DIAGNOSIS — I739 Peripheral vascular disease, unspecified: Secondary | ICD-10-CM

## 2013-12-27 LAB — POCT INR: INR: 2.7

## 2013-12-27 NOTE — Patient Instructions (Addendum)
Your physician wants you to follow-up in:  [place] will receive a reminder letter in the mail two months in advance. If you don't receive a letter, please call our office to schedule the follow-up appointment. Your physician recommends that you continue on your current medications as directed. Please refer to the Current Medication list given to you today. Your physician has requested that you have a lower or upper extremity arterial duplex. This test is an ultrasound of the arteries in the legs or arms. It looks at arterial blood flow in the legs and arms. Allow one hour for Lower and Upper Arterial scans. There are no restrictions or special instructions UPPER   WITH  ABI'S NEEDS NEXT  AVAILABLE WITH   COUMADIN CLINIC   TODAY IF POSSIBLE

## 2013-12-27 NOTE — Assessment & Plan Note (Signed)
Well controlled.  Continue current medications and low sodium Dash type diet.    

## 2013-12-27 NOTE — Progress Notes (Signed)
Patient ID: [name] [name], male   DOB: May 25, 1946, 68 y.o.   MRN: [id number] S/P complex redo vascular surgery by Dr Donnetta Hutching 2/13 . Previous left groin infection with vascular surgery. Resting claudication resolved but still with mild sensory neuropathy. S/P AVR on coumadin. No SSCP, fevers palpitations or dyspnea. Compliant with meds and coumadin followed at our clinic. LLE edema post op Venous duplex  3/18 normal with no DVT Admitted in 5/14  With CVA to frontal lobe INR only 1.52  He admits to missing a couple of coumadin appts  Echo 5/31 ok including valve   - Left ventricle: The cavity size was normal. Wall thickness was increased in a pattern of moderate LVH. Systolic function was normal. The estimated ejection fraction was in the range of 60% to 65%. Wall motion was normal; there were no regional wall motion abnormalities. - Aortic valve: A [employer] mechanical prosthesis was present and functioning normally. - Mitral valve: Mildly to moderately calcified annulus. Mildly calcified leaflets . - Left atrium: The atrium was mildly to moderately dilated. Impressions:  - No cardiac source of embolism was identified, but cannot be ruled out on the basis of this examination. A TEE would better evaluate the prosthetic valve  MRA with 70% stenosis in right subclavian  IMPRESSION:  1. 70% stenosis of the proximal right subclavian artery proximal to the vertebral artery origin. 2. Mild stenoses of the vertebral artery origins bilaterally are less than 50% relative to the distal vessels. 3. Slight atherosclerotic irregularity of the right carotid bifurcation without a significant stenosis. 4. No significant disease at the left carotid bifurcation.    ROS: Denies fever, malais, weight loss, blurry vision, decreased visual acuity, cough, sputum, SOB, hemoptysis, pleuritic pain, palpitaitons, heartburn, abdominal pain, melena, lower extremity edema, claudication, or rash.  All  other systems reviewed and negative  General: Affect appropriate Healthy:  appears stated age 68: normal Neck supple with no adenopathy JVP normal no bruits no thyromegaly Lungs clear with no wheezing and good diaphragmatic motion Heart:  N8/G9 click  No AR  murmur, no rub, gallop or click PMI normal Abdomen: benighn, BS positve, no tenderness, no AAA no bruit.  No HSM or HJR Distal pulses intact with no bruits No edema Neuro non-focal Skin warm and dry No muscular weakness   Current Outpatient Prescriptions  Medication Sig Dispense Refill  . amLODipine (NORVASC) 5 MG tablet Take 5 mg by mouth daily.       [name] atorvastatin (LIPITOR) 40 MG tablet Take 40 mg by mouth daily.       [name] glyBURIDE (DIABETA) 2.5 MG tablet Take 5 mg by mouth 2 (two) times daily with a meal.      . hydrochlorothiazide (MICROZIDE) 12.5 MG capsule Take 1 capsule (12.5 mg total) by mouth daily.  30 capsule  5  . metFORMIN (GLUCOPHAGE) 500 MG tablet Take 1,000 mg by mouth 2 (two) times daily with a meal.       . metoprolol succinate (TOPROL-XL) 50 MG 24 hr tablet TAKE 1 TABLET EVERY DAY  30 tablet  6  . trandolapril (MAVIK) 4 MG tablet TAKE 1 TABLET BY MOUTH ONCE DAILY  30 tablet  0  . warfarin (COUMADIN) 7.5 MG tablet TAKE 1 TABLET (7.5 MG TOTAL) BY MOUTH AS DIRECTED.  34 tablet  3   No current facility-administered medications for this visit.    Allergies  Review of patient's allergies indicates no known allergies.  Electrocardiogram:  SR rate 68  Normal   Assessment and Plan

## 2013-12-27 NOTE — Assessment & Plan Note (Signed)
Stable with no angina and good activity level.  Continue medical Rx  

## 2013-12-27 NOTE — Assessment & Plan Note (Signed)
Seems to be related to low INR  Discussed importance of keeping clinic appt.  Also discussed need for lovenox bridging with any procedures Run INR 2.5-3.0

## 2013-12-27 NOTE — Assessment & Plan Note (Signed)
LE are improved post surgery with bilateral femoral bruits.  Some neuropathic pains.  BP equal in both arms will do UE arterial ABI's and duplex to further assess and make sure there is no steal

## 2013-12-27 NOTE — Assessment & Plan Note (Signed)
S/P mechanical [employer] AVR  Ecoh 5/14 normal function  SBE  And close coumadin f/u

## 2014-01-01 ENCOUNTER — Other Ambulatory Visit: Payer: Self-pay | Admitting: Cardiovascular Disease

## 2014-01-02 ENCOUNTER — Ambulatory Visit (HOSPITAL_COMMUNITY): Payer: Medicare Other | Attending: Cardiovascular Disease

## 2014-01-02 DIAGNOSIS — E785 Hyperlipidemia, unspecified: Secondary | ICD-10-CM | POA: Diagnosis not present

## 2014-01-02 DIAGNOSIS — I771 Stricture of artery: Secondary | ICD-10-CM | POA: Diagnosis not present

## 2014-01-02 DIAGNOSIS — Z87891 Personal history of nicotine dependence: Secondary | ICD-10-CM | POA: Diagnosis not present

## 2014-01-02 DIAGNOSIS — I2510 Atherosclerotic heart disease of native coronary artery without angina pectoris: Secondary | ICD-10-CM | POA: Diagnosis not present

## 2014-01-02 DIAGNOSIS — I10 Essential (primary) hypertension: Secondary | ICD-10-CM | POA: Insufficient documentation

## 2014-01-02 DIAGNOSIS — E119 Type 2 diabetes mellitus without complications: Secondary | ICD-10-CM | POA: Diagnosis not present

## 2014-01-02 DIAGNOSIS — R209 Unspecified disturbances of skin sensation: Secondary | ICD-10-CM | POA: Diagnosis not present

## 2014-01-02 DIAGNOSIS — I739 Peripheral vascular disease, unspecified: Secondary | ICD-10-CM

## 2014-01-03 ENCOUNTER — Other Ambulatory Visit (HOSPITAL_COMMUNITY): Payer: Self-pay | Admitting: Cardiology

## 2014-01-03 DIAGNOSIS — I771 Stricture of artery: Secondary | ICD-10-CM

## 2014-01-08 ENCOUNTER — Other Ambulatory Visit: Payer: Self-pay | Admitting: Cardiovascular Disease

## 2014-01-15 DIAGNOSIS — Z1212 Encounter for screening for malignant neoplasm of rectum: Secondary | ICD-10-CM | POA: Diagnosis not present

## 2014-01-25 DIAGNOSIS — E119 Type 2 diabetes mellitus without complications: Secondary | ICD-10-CM | POA: Diagnosis not present

## 2014-01-25 DIAGNOSIS — H25019 Cortical age-related cataract, unspecified eye: Secondary | ICD-10-CM | POA: Diagnosis not present

## 2014-01-31 ENCOUNTER — Ambulatory Visit (INDEPENDENT_AMBULATORY_CARE_PROVIDER_SITE_OTHER): Payer: Medicare Other | Admitting: Pharmacist

## 2014-01-31 DIAGNOSIS — G459 Transient cerebral ischemic attack, unspecified: Secondary | ICD-10-CM

## 2014-01-31 DIAGNOSIS — I359 Nonrheumatic aortic valve disorder, unspecified: Secondary | ICD-10-CM

## 2014-01-31 LAB — POCT INR: INR: 3.5

## 2014-03-13 ENCOUNTER — Ambulatory Visit (INDEPENDENT_AMBULATORY_CARE_PROVIDER_SITE_OTHER): Payer: Medicare Other | Admitting: *Deleted

## 2014-03-13 DIAGNOSIS — I359 Nonrheumatic aortic valve disorder, unspecified: Secondary | ICD-10-CM | POA: Diagnosis not present

## 2014-03-13 DIAGNOSIS — G459 Transient cerebral ischemic attack, unspecified: Secondary | ICD-10-CM | POA: Diagnosis not present

## 2014-03-13 DIAGNOSIS — Z5181 Encounter for therapeutic drug level monitoring: Secondary | ICD-10-CM | POA: Insufficient documentation

## 2014-03-13 LAB — POCT INR: INR: 3.7

## 2014-03-15 ENCOUNTER — Other Ambulatory Visit: Payer: Self-pay | Admitting: *Deleted

## 2014-03-15 MED ORDER — WARFARIN SODIUM 7.5 MG PO TABS: ORAL_TABLET | ORAL | Status: DC

## 2014-04-10 ENCOUNTER — Ambulatory Visit (INDEPENDENT_AMBULATORY_CARE_PROVIDER_SITE_OTHER): Payer: Medicare Other

## 2014-04-10 DIAGNOSIS — Z5181 Encounter for therapeutic drug level monitoring: Secondary | ICD-10-CM

## 2014-04-10 DIAGNOSIS — G459 Transient cerebral ischemic attack, unspecified: Secondary | ICD-10-CM

## 2014-04-10 DIAGNOSIS — D239 Other benign neoplasm of skin, unspecified: Secondary | ICD-10-CM | POA: Diagnosis not present

## 2014-04-10 DIAGNOSIS — I359 Nonrheumatic aortic valve disorder, unspecified: Secondary | ICD-10-CM | POA: Diagnosis not present

## 2014-04-10 DIAGNOSIS — L570 Actinic keratosis: Secondary | ICD-10-CM | POA: Diagnosis not present

## 2014-04-10 LAB — POCT INR: INR: 3

## 2014-05-03 ENCOUNTER — Other Ambulatory Visit: Payer: Self-pay

## 2014-05-03 MED ORDER — METOPROLOL SUCCINATE ER 50 MG PO TB24: ORAL_TABLET | ORAL | Status: DC

## 2014-05-15 ENCOUNTER — Ambulatory Visit (INDEPENDENT_AMBULATORY_CARE_PROVIDER_SITE_OTHER): Payer: Medicare Other | Admitting: *Deleted

## 2014-05-15 DIAGNOSIS — G459 Transient cerebral ischemic attack, unspecified: Secondary | ICD-10-CM | POA: Diagnosis not present

## 2014-05-15 DIAGNOSIS — I359 Nonrheumatic aortic valve disorder, unspecified: Secondary | ICD-10-CM

## 2014-05-15 DIAGNOSIS — Z5181 Encounter for therapeutic drug level monitoring: Secondary | ICD-10-CM

## 2014-05-15 LAB — POCT INR: INR: 2.9

## 2014-05-31 ENCOUNTER — Other Ambulatory Visit: Payer: Self-pay

## 2014-05-31 MED ORDER — HYDROCHLOROTHIAZIDE 12.5 MG PO CAPS: ORAL_CAPSULE | ORAL | Status: DC

## 2014-06-05 DIAGNOSIS — I10 Essential (primary) hypertension: Secondary | ICD-10-CM | POA: Diagnosis not present

## 2014-06-05 DIAGNOSIS — E785 Hyperlipidemia, unspecified: Secondary | ICD-10-CM | POA: Diagnosis not present

## 2014-06-05 DIAGNOSIS — E1159 Type 2 diabetes mellitus with other circulatory complications: Secondary | ICD-10-CM | POA: Diagnosis not present

## 2014-06-05 DIAGNOSIS — I771 Stricture of artery: Secondary | ICD-10-CM | POA: Diagnosis not present

## 2014-06-05 DIAGNOSIS — I700 Atherosclerosis of aorta: Secondary | ICD-10-CM | POA: Diagnosis not present

## 2014-06-05 DIAGNOSIS — Z1331 Encounter for screening for depression: Secondary | ICD-10-CM | POA: Diagnosis not present

## 2014-06-05 DIAGNOSIS — I6990 Unspecified sequelae of unspecified cerebrovascular disease: Secondary | ICD-10-CM | POA: Diagnosis not present

## 2014-06-05 DIAGNOSIS — Z79899 Other long term (current) drug therapy: Secondary | ICD-10-CM | POA: Diagnosis not present

## 2014-06-12 ENCOUNTER — Ambulatory Visit (INDEPENDENT_AMBULATORY_CARE_PROVIDER_SITE_OTHER): Payer: Medicare Other | Admitting: *Deleted

## 2014-06-12 DIAGNOSIS — I359 Nonrheumatic aortic valve disorder, unspecified: Secondary | ICD-10-CM

## 2014-06-12 DIAGNOSIS — G459 Transient cerebral ischemic attack, unspecified: Secondary | ICD-10-CM

## 2014-06-12 DIAGNOSIS — Z5181 Encounter for therapeutic drug level monitoring: Secondary | ICD-10-CM

## 2014-06-12 LAB — POCT INR: INR: 3.4

## 2014-06-24 DIAGNOSIS — H9209 Otalgia, unspecified ear: Secondary | ICD-10-CM | POA: Diagnosis not present

## 2014-06-24 DIAGNOSIS — H6120 Impacted cerumen, unspecified ear: Secondary | ICD-10-CM | POA: Diagnosis not present

## 2014-07-06 ENCOUNTER — Other Ambulatory Visit: Payer: Self-pay

## 2014-07-06 MED ORDER — TRANDOLAPRIL 4 MG PO TABS: 4.0000 mg | ORAL_TABLET | Freq: Every day | ORAL | Status: DC

## 2014-07-20 ENCOUNTER — Ambulatory Visit (INDEPENDENT_AMBULATORY_CARE_PROVIDER_SITE_OTHER): Payer: Medicare Other | Admitting: Cardiovascular Disease

## 2014-07-20 VITALS — BP 142/68 | HR 66 | Ht 68.0 in | Wt 178.0 lb

## 2014-07-20 DIAGNOSIS — I739 Peripheral vascular disease, unspecified: Secondary | ICD-10-CM | POA: Diagnosis not present

## 2014-07-20 DIAGNOSIS — I10 Essential (primary) hypertension: Secondary | ICD-10-CM | POA: Diagnosis not present

## 2014-07-20 DIAGNOSIS — I2510 Atherosclerotic heart disease of native coronary artery without angina pectoris: Secondary | ICD-10-CM

## 2014-07-20 DIAGNOSIS — G459 Transient cerebral ischemic attack, unspecified: Secondary | ICD-10-CM

## 2014-07-20 DIAGNOSIS — Z952 Presence of prosthetic heart valve: Secondary | ICD-10-CM | POA: Insufficient documentation

## 2014-07-20 DIAGNOSIS — Z954 Presence of other heart-valve replacement: Secondary | ICD-10-CM

## 2014-07-20 DIAGNOSIS — E7800 Pure hypercholesterolemia, unspecified: Secondary | ICD-10-CM

## 2014-07-20 NOTE — Assessment & Plan Note (Signed)
Well controlled.  Continue current medications and low sodium Dash type diet.    

## 2014-07-20 NOTE — Assessment & Plan Note (Signed)
Resolved no afib  AVR ok  Occurred with subRx INR so will need lovenox bridge in future

## 2014-07-20 NOTE — Assessment & Plan Note (Signed)
Mild  Improved since surgery by Dr Donnetta Hutching  Continue exercise program  ABI for change in symptoms  Bilateral femoral bruits unchanged

## 2014-07-20 NOTE — Patient Instructions (Signed)
Your physician wants you to follow-up in:   [place] will receive a reminder letter in the mail two months in advance. If you don't receive a letter, please call our office to schedule the follow-up appointment. Your physician recommends that you continue on your current medications as directed. Please refer to the Current Medication list given to you today.

## 2014-07-20 NOTE — Assessment & Plan Note (Signed)
Cholesterol is at goal.  Continue current dose of statin and diet Rx.  No myalgias or side effects.  F/U  LFT's in 6 months. Lab Results  Component Value Date   LDLCALC 62 05/06/2013

## 2014-07-20 NOTE — Assessment & Plan Note (Signed)
Normal valve functoin by echo 2014  Normal exam  Continue coumadin goal 3.0-3.5

## 2014-07-20 NOTE — Assessment & Plan Note (Signed)
Stable with no angina and good activity level.  Continue medical Rx  

## 2014-07-20 NOTE — Progress Notes (Signed)
Patient ID: [name] [name], male   DOB: 1946-10-18, 68 y.o.   MRN: [id number] 68 yo S/P complex redo vascular surgery by Dr Donnetta Hutching 2/13 . Previous left groin infection with vascular surgery. Resting claudication resolved but still with mild sensory neuropathy. S/P AVR on coumadin. No SSCP, fevers palpitations or dyspnea. Compliant with meds and coumadin followed at our clinic. LLE edema post op Venous duplex  3/18 normal with no DVT Admitted in 5/14 With CVA to frontal lobe INR only 1.52 He admits to missing a couple of coumadin appts Echo 5/31 ok including valve  - Left ventricle: The cavity size was normal. Wall thickness was increased in a pattern of moderate LVH. Systolic function was normal. The estimated ejection fraction was in the range of 60% to 65%. Wall motion was normal; there were no regional wall motion abnormalities. - Aortic valve: A [employer] mechanical prosthesis was present and functioning normally. - Mitral valve: Mildly to moderately calcified annulus. Mildly calcified leaflets . - Left atrium: The atrium was mildly to moderately dilated. Impressions:  - No cardiac source of embolism was identified, but cannot be ruled out on the basis of this examination. A TEE would better evaluate the prosthetic valve  MRA with 70% stenosis in right subclavian  IMPRESSION:  1. 70% stenosis of the proximal right subclavian artery proximal to the vertebral artery origin. 2. Mild stenoses of the vertebral artery origins bilaterally are less than 50% relative to the distal vessels. 3. Slight atherosclerotic irregularity of the right carotid bifurcation without a significant stenosis. 4. No significant disease at the left carotid bifurcation.  Mild claudication RLE  After walking / running over 2 miles  INR;s have been Rx on high side which is where we want them     ROS: Denies fever, malais, weight loss, blurry vision, decreased visual acuity, cough, sputum, SOB,  hemoptysis, pleuritic pain, palpitaitons, heartburn, abdominal pain, melena, lower extremity edema, claudication, or rash.  All other systems reviewed and negative  General: Affect appropriate Healthy:  appears stated age 68: normal Neck supple with no adenopathy JVP normal no bruits no thyromegaly Lungs clear with no wheezing and good diaphragmatic motion Heart:  Q3/R0 click SEM no AR  murmur, no rub, gallop or click PMI normal Abdomen: benighn, BS positve, no tenderness, no AAA Bilateral femoral  Bruit. With surgical scars  No HSM or HJR Distal pulses intact with no bruits No edema Neuro non-focal Skin warm and dry No muscular weakness   Current Outpatient Prescriptions  Medication Sig Dispense Refill  . amLODipine (NORVASC) 5 MG tablet Take 5 mg by mouth daily.       [name] atorvastatin (LIPITOR) 40 MG tablet Take 40 mg by mouth daily.       [name] glyBURIDE (DIABETA) 2.5 MG tablet Take 5 mg by mouth 2 (two) times daily with a meal.      . hydrochlorothiazide (MICROZIDE) 12.5 MG capsule TAKE ONE CAPSULE BY MOUTH EVERY DAY  30 capsule  5  . metFORMIN (GLUCOPHAGE) 500 MG tablet Take 1,000 mg by mouth 2 (two) times daily with a meal.       . metoprolol succinate (TOPROL-XL) 50 MG 24 hr tablet TAKE 1 TABLET EVERY DAY  30 tablet  6  . trandolapril (MAVIK) 4 MG tablet Take 1 tablet (4 mg total) by mouth daily.  30 tablet  5  . warfarin (COUMADIN) 7.5 MG tablet 1 tablet daily except 1.5 tablets on Saturdays OR AS DIRECTED BY COUMADADIN  CLINIC  40 tablet  3   No current facility-administered medications for this visit.    Allergies  Review of patient's allergies indicates no known allergies.  Electrocardiogram:  SR rate 68 normal   Assessment and Plan

## 2014-08-03 ENCOUNTER — Telehealth: Payer: Self-pay

## 2014-08-03 MED ORDER — WARFARIN SODIUM 7.5 MG PO TABS: ORAL_TABLET | ORAL | Status: DC

## 2014-08-03 NOTE — Telephone Encounter (Signed)
Left message for patient to call back.  Appears he missed his 07/20/14 protime appointment.  Need to reschedule a new appointment, then refill warfarin 7.5 mg pills at that time.  Will await patient's call back.

## 2014-08-14 DIAGNOSIS — E1159 Type 2 diabetes mellitus with other circulatory complications: Secondary | ICD-10-CM | POA: Diagnosis not present

## 2014-08-14 DIAGNOSIS — I6990 Unspecified sequelae of unspecified cerebrovascular disease: Secondary | ICD-10-CM | POA: Diagnosis not present

## 2014-08-14 DIAGNOSIS — E785 Hyperlipidemia, unspecified: Secondary | ICD-10-CM | POA: Diagnosis not present

## 2014-08-14 DIAGNOSIS — Z23 Encounter for immunization: Secondary | ICD-10-CM | POA: Diagnosis not present

## 2014-08-14 DIAGNOSIS — I10 Essential (primary) hypertension: Secondary | ICD-10-CM | POA: Diagnosis not present

## 2014-08-14 DIAGNOSIS — G459 Transient cerebral ischemic attack, unspecified: Secondary | ICD-10-CM | POA: Diagnosis not present

## 2014-08-14 DIAGNOSIS — I2510 Atherosclerotic heart disease of native coronary artery without angina pectoris: Secondary | ICD-10-CM | POA: Diagnosis not present

## 2014-08-14 DIAGNOSIS — I771 Stricture of artery: Secondary | ICD-10-CM | POA: Diagnosis not present

## 2014-09-28 ENCOUNTER — Ambulatory Visit (INDEPENDENT_AMBULATORY_CARE_PROVIDER_SITE_OTHER): Payer: Medicare Other | Admitting: Pharmacist

## 2014-09-28 DIAGNOSIS — G459 Transient cerebral ischemic attack, unspecified: Secondary | ICD-10-CM

## 2014-09-28 DIAGNOSIS — Z5181 Encounter for therapeutic drug level monitoring: Secondary | ICD-10-CM | POA: Diagnosis not present

## 2014-09-28 DIAGNOSIS — I359 Nonrheumatic aortic valve disorder, unspecified: Secondary | ICD-10-CM | POA: Diagnosis not present

## 2014-09-28 LAB — POCT INR: INR: 3.4

## 2014-10-02 ENCOUNTER — Other Ambulatory Visit: Payer: Self-pay | Admitting: *Deleted

## 2014-10-02 MED ORDER — WARFARIN SODIUM 7.5 MG PO TABS: ORAL_TABLET | ORAL | Status: DC

## 2014-10-05 ENCOUNTER — Telehealth: Payer: Self-pay

## 2014-10-05 MED ORDER — WARFARIN SODIUM 7.5 MG PO TABS: ORAL_TABLET | ORAL | Status: DC

## 2014-10-05 NOTE — Telephone Encounter (Signed)
Rx done. 

## 2014-10-08 ENCOUNTER — Encounter: Payer: Self-pay | Admitting: Gastroenterology

## 2014-10-08 ENCOUNTER — Encounter: Payer: Self-pay | Admitting: Internal Medicine

## 2014-10-09 DIAGNOSIS — L570 Actinic keratosis: Secondary | ICD-10-CM | POA: Diagnosis not present

## 2014-11-09 ENCOUNTER — Ambulatory Visit (INDEPENDENT_AMBULATORY_CARE_PROVIDER_SITE_OTHER): Payer: Medicare Other

## 2014-11-09 DIAGNOSIS — Z5181 Encounter for therapeutic drug level monitoring: Secondary | ICD-10-CM

## 2014-11-09 DIAGNOSIS — G459 Transient cerebral ischemic attack, unspecified: Secondary | ICD-10-CM | POA: Diagnosis not present

## 2014-11-09 DIAGNOSIS — I359 Nonrheumatic aortic valve disorder, unspecified: Secondary | ICD-10-CM

## 2014-11-09 LAB — POCT INR: INR: 3.1

## 2014-11-13 DIAGNOSIS — Z23 Encounter for immunization: Secondary | ICD-10-CM | POA: Diagnosis not present

## 2014-11-15 ENCOUNTER — Encounter (HOSPITAL_COMMUNITY): Payer: Self-pay | Admitting: Cardiovascular Disease

## 2014-12-09 ENCOUNTER — Other Ambulatory Visit: Payer: Self-pay | Admitting: Cardiovascular Disease

## 2014-12-12 DIAGNOSIS — I2510 Atherosclerotic heart disease of native coronary artery without angina pectoris: Secondary | ICD-10-CM | POA: Diagnosis not present

## 2014-12-13 DIAGNOSIS — E785 Hyperlipidemia, unspecified: Secondary | ICD-10-CM | POA: Diagnosis not present

## 2014-12-13 DIAGNOSIS — I10 Essential (primary) hypertension: Secondary | ICD-10-CM | POA: Diagnosis not present

## 2014-12-13 DIAGNOSIS — Z008 Encounter for other general examination: Secondary | ICD-10-CM | POA: Diagnosis not present

## 2014-12-13 DIAGNOSIS — E1151 Type 2 diabetes mellitus with diabetic peripheral angiopathy without gangrene: Secondary | ICD-10-CM | POA: Diagnosis not present

## 2014-12-13 DIAGNOSIS — Z125 Encounter for screening for malignant neoplasm of prostate: Secondary | ICD-10-CM | POA: Diagnosis not present

## 2014-12-20 DIAGNOSIS — R197 Diarrhea, unspecified: Secondary | ICD-10-CM | POA: Diagnosis not present

## 2014-12-20 DIAGNOSIS — Z125 Encounter for screening for malignant neoplasm of prostate: Secondary | ICD-10-CM | POA: Diagnosis not present

## 2014-12-20 DIAGNOSIS — K635 Polyp of colon: Secondary | ICD-10-CM | POA: Diagnosis not present

## 2014-12-20 DIAGNOSIS — H9193 Unspecified hearing loss, bilateral: Secondary | ICD-10-CM | POA: Diagnosis not present

## 2014-12-20 DIAGNOSIS — J309 Allergic rhinitis, unspecified: Secondary | ICD-10-CM | POA: Diagnosis not present

## 2014-12-20 DIAGNOSIS — Z6827 Body mass index (BMI) 27.0-27.9, adult: Secondary | ICD-10-CM | POA: Diagnosis not present

## 2014-12-20 DIAGNOSIS — I700 Atherosclerosis of aorta: Secondary | ICD-10-CM | POA: Diagnosis not present

## 2014-12-20 DIAGNOSIS — Z1389 Encounter for screening for other disorder: Secondary | ICD-10-CM | POA: Diagnosis not present

## 2014-12-20 DIAGNOSIS — F5221 Male erectile disorder: Secondary | ICD-10-CM | POA: Diagnosis not present

## 2014-12-20 DIAGNOSIS — M25511 Pain in right shoulder: Secondary | ICD-10-CM | POA: Diagnosis not present

## 2014-12-20 DIAGNOSIS — Z0000 Encounter for general adult medical examination without abnormal findings: Secondary | ICD-10-CM | POA: Diagnosis not present

## 2014-12-20 DIAGNOSIS — I771 Stricture of artery: Secondary | ICD-10-CM | POA: Diagnosis not present

## 2014-12-21 ENCOUNTER — Ambulatory Visit (INDEPENDENT_AMBULATORY_CARE_PROVIDER_SITE_OTHER): Payer: Medicare Other | Admitting: *Deleted

## 2014-12-21 DIAGNOSIS — I359 Nonrheumatic aortic valve disorder, unspecified: Secondary | ICD-10-CM

## 2014-12-21 DIAGNOSIS — Z5181 Encounter for therapeutic drug level monitoring: Secondary | ICD-10-CM

## 2014-12-21 DIAGNOSIS — G459 Transient cerebral ischemic attack, unspecified: Secondary | ICD-10-CM

## 2014-12-21 DIAGNOSIS — Z1212 Encounter for screening for malignant neoplasm of rectum: Secondary | ICD-10-CM | POA: Diagnosis not present

## 2014-12-21 LAB — IFOBT (OCCULT BLOOD): IFOBT: NEGATIVE

## 2014-12-21 LAB — POCT INR: INR: 2.3

## 2015-01-01 ENCOUNTER — Other Ambulatory Visit: Payer: Self-pay | Admitting: Cardiovascular Disease

## 2015-01-04 ENCOUNTER — Other Ambulatory Visit: Payer: Self-pay | Admitting: Cardiovascular Disease

## 2015-01-07 NOTE — Progress Notes (Signed)
Patient ID: [name] [name], male   DOB: Dec 22, 1945, 69 y.o.   MRN: [id number] 69 y.o. S/P complex redo vascular surgery by Dr Donnetta Hutching 2/13 . Previous left groin infection with vascular surgery. Resting claudication resolved but still with mild sensory neuropathy. S/P AVR on coumadin. No SSCP, fevers palpitations or dyspnea. Compliant with meds and coumadin followed at our clinic. LLE edema post op Venous duplex  3/18 normal with no DVT   Admitted in 5/14 With CVA to frontal lobe INR only 1.52 He admits to missing a couple of coumadin appts Echo 5/31 ok including valve   - Left ventricle: The cavity size was normal. Wall thickness was increased in a pattern of moderate LVH. Systolic function was normal. The estimated ejection fraction was in the range of 60% to 65%. Wall motion was normal; there were no regional wall motion abnormalities. - Aortic valve: A [employer] mechanical prosthesis was present and functioning normally. - Mitral valve: Mildly to moderately calcified annulus. Mildly calcified leaflets . - Left atrium: The atrium was mildly to moderately dilated. Impressions:  - No cardiac source of embolism was identified, but cannot be ruled out on the basis of this examination. A TEE would better evaluate the prosthetic valve   MRA with 70% stenosis in right subclavian  IMPRESSION:  1. 70% stenosis of the proximal right subclavian artery proximal to the vertebral artery origin. 2. Mild stenoses of the vertebral artery origins bilaterally are less than 50% relative to the distal vessels. 3. Slight atherosclerotic irregularity of the right carotid bifurcation without a significant stenosis. 4. No significant disease at the left carotid bifurcation.  Mild claudication RLE  After walking / running over 2 miles  INR;s have been Rx on high side which is where we want them   Needs lovenox bridging when ever coumadin stopped INR goal 3-3.5   ROS: Denies fever, malais,  weight loss, blurry vision, decreased visual acuity, cough, sputum, SOB, hemoptysis, pleuritic pain, palpitaitons, heartburn, abdominal pain, melena, lower extremity edema, claudication, or rash.  All other systems reviewed and negative  General: Affect appropriate Healthy:  appears stated age 66: normal Neck supple with no adenopathy JVP normal no bruits no thyromegaly Lungs clear with no wheezing and good diaphragmatic motion Heart:  P9/J0 click SEM no AR  murmur, no rub, gallop or click PMI normal Abdomen: benighn, BS positve, no tenderness, no AAA Bilateral femoral  Bruit. With surgical scars  No HSM or HJR Distal pulses intact with no bruits No edema Neuro non-focal Skin warm and dry No muscular weakness   Current Outpatient Prescriptions  Medication Sig Dispense Refill  . amLODipine (NORVASC) 5 MG tablet Take 5 mg by mouth daily.     [name] atorvastatin (LIPITOR) 40 MG tablet Take 40 mg by mouth daily.     [name] glyBURIDE (DIABETA) 2.5 MG tablet Take 5 mg by mouth 2 (two) times daily with a meal.    . hydrochlorothiazide (MICROZIDE) 12.5 MG capsule TAKE ONE CAPSULE BY MOUTH EVERY DAY 30 capsule 5  . metFORMIN (GLUCOPHAGE) 500 MG tablet Take 1,000 mg by mouth 2 (two) times daily with a meal.     . metoprolol succinate (TOPROL-XL) 50 MG 24 hr tablet TAKE 1 TABLET BY MOUTH EVERY DAY 30 tablet 0  . trandolapril (MAVIK) 4 MG tablet TAKE 1 TABLET BY MOUTH EVERY DAY 30 tablet 5  . warfarin (COUMADIN) 7.5 MG tablet 1 tablet daily except 1.5 tablets on Saturdays OR AS DIRECTED BY COUMADADIN  CLINIC 40 tablet 2   No current facility-administered medications for this visit.    Allergies  Review of patient's allergies indicates no known allergies.  Electrocardiogram:  SR rate 68 normal  07/20/14   2/1 16  SR rate 68 normal   Assessment and Plan

## 2015-01-08 ENCOUNTER — Ambulatory Visit (INDEPENDENT_AMBULATORY_CARE_PROVIDER_SITE_OTHER): Payer: Medicare Other | Admitting: Cardiovascular Disease

## 2015-01-08 ENCOUNTER — Ambulatory Visit (INDEPENDENT_AMBULATORY_CARE_PROVIDER_SITE_OTHER): Payer: Medicare Other

## 2015-01-08 ENCOUNTER — Encounter: Payer: Self-pay | Admitting: Cardiovascular Disease

## 2015-01-08 VITALS — BP 200/0 | HR 68 | Ht 68.0 in | Wt 180.5 lb

## 2015-01-08 DIAGNOSIS — Z5181 Encounter for therapeutic drug level monitoring: Secondary | ICD-10-CM

## 2015-01-08 DIAGNOSIS — IMO0001 Reserved for inherently not codable concepts without codable children: Secondary | ICD-10-CM

## 2015-01-08 DIAGNOSIS — I10 Essential (primary) hypertension: Secondary | ICD-10-CM | POA: Diagnosis not present

## 2015-01-08 DIAGNOSIS — Z954 Presence of other heart-valve replacement: Secondary | ICD-10-CM | POA: Diagnosis not present

## 2015-01-08 DIAGNOSIS — R030 Elevated blood-pressure reading, without diagnosis of hypertension: Secondary | ICD-10-CM

## 2015-01-08 DIAGNOSIS — R0989 Other specified symptoms and signs involving the circulatory and respiratory systems: Secondary | ICD-10-CM | POA: Diagnosis not present

## 2015-01-08 DIAGNOSIS — I2510 Atherosclerotic heart disease of native coronary artery without angina pectoris: Secondary | ICD-10-CM

## 2015-01-08 DIAGNOSIS — I739 Peripheral vascular disease, unspecified: Secondary | ICD-10-CM

## 2015-01-08 DIAGNOSIS — Z952 Presence of prosthetic heart valve: Secondary | ICD-10-CM

## 2015-01-08 DIAGNOSIS — G459 Transient cerebral ischemic attack, unspecified: Secondary | ICD-10-CM | POA: Diagnosis not present

## 2015-01-08 DIAGNOSIS — I359 Nonrheumatic aortic valve disorder, unspecified: Secondary | ICD-10-CM

## 2015-01-08 LAB — POCT INR: INR: 5.6

## 2015-01-08 NOTE — Assessment & Plan Note (Signed)
Unusual today as BP not compressable  Right subclavian stenosis with lower BP on right  He will check his BP at home and f/u with me in a few weeks Was normal in Dr Tomie China office a couple of weeks ago  Check renal duplex r/o RAS given vascular disease

## 2015-01-08 NOTE — Patient Instructions (Addendum)
Your physician recommends that you schedule a follow-up appointment in: [place]  Your physician recommends that you continue on your current medications as directed. Please refer to the Current Medication list given to you today.  Your physician has requested that you have a renal artery duplex. During this test, an ultrasound is used to evaluate blood flow to the kidneys. Allow one hour for this exam. Do not eat after midnight the day before and avoid carbonated beverages. Take your medications as you usually do.   Your physician has requested that you have a lower extremity arterial exercise duplex. During this test, exercise and ultrasound are used to evaluate arterial blood flow in the legs. Allow one hour for this exam. There are no restrictions or special instructions.  WITH ABI'S   Your physician has requested that you have a carotid duplex. This test is an ultrasound of the carotid arteries in your neck. It looks at blood flow through these arteries that supply the brain with blood. Allow one hour for this exam. There are no restrictions or special instructions.

## 2015-01-08 NOTE — Assessment & Plan Note (Signed)
Does not see Dr Early regularly Post fem pop revision  F/U LE arterial duplex and ABI's  Mild RLE claudication stable

## 2015-01-08 NOTE — Assessment & Plan Note (Signed)
Stable with no angina and good activity level.  Continue medical Rx  

## 2015-01-08 NOTE — Assessment & Plan Note (Signed)
INR today elevated want it to be 2-0.8  Normal valve clicks  SBE prophylaxis f/u coumadin clinic

## 2015-01-24 ENCOUNTER — Ambulatory Visit (INDEPENDENT_AMBULATORY_CARE_PROVIDER_SITE_OTHER): Payer: Medicare Other | Admitting: *Deleted

## 2015-01-24 ENCOUNTER — Ambulatory Visit (HOSPITAL_COMMUNITY): Payer: Medicare Other | Attending: Cardiology | Admitting: Cardiology

## 2015-01-24 ENCOUNTER — Encounter (HOSPITAL_COMMUNITY): Payer: Medicare Other

## 2015-01-24 DIAGNOSIS — I10 Essential (primary) hypertension: Secondary | ICD-10-CM

## 2015-01-24 DIAGNOSIS — I701 Atherosclerosis of renal artery: Secondary | ICD-10-CM | POA: Diagnosis not present

## 2015-01-24 DIAGNOSIS — I2510 Atherosclerotic heart disease of native coronary artery without angina pectoris: Secondary | ICD-10-CM | POA: Insufficient documentation

## 2015-01-24 DIAGNOSIS — G459 Transient cerebral ischemic attack, unspecified: Secondary | ICD-10-CM

## 2015-01-24 DIAGNOSIS — E119 Type 2 diabetes mellitus without complications: Secondary | ICD-10-CM

## 2015-01-24 DIAGNOSIS — I739 Peripheral vascular disease, unspecified: Secondary | ICD-10-CM | POA: Insufficient documentation

## 2015-01-24 DIAGNOSIS — Z87891 Personal history of nicotine dependence: Secondary | ICD-10-CM | POA: Insufficient documentation

## 2015-01-24 DIAGNOSIS — Z5181 Encounter for therapeutic drug level monitoring: Secondary | ICD-10-CM | POA: Diagnosis not present

## 2015-01-24 DIAGNOSIS — R030 Elevated blood-pressure reading, without diagnosis of hypertension: Secondary | ICD-10-CM

## 2015-01-24 DIAGNOSIS — I359 Nonrheumatic aortic valve disorder, unspecified: Secondary | ICD-10-CM

## 2015-01-24 DIAGNOSIS — IMO0001 Reserved for inherently not codable concepts without codable children: Secondary | ICD-10-CM

## 2015-01-24 DIAGNOSIS — E785 Hyperlipidemia, unspecified: Secondary | ICD-10-CM | POA: Insufficient documentation

## 2015-01-24 LAB — POCT INR: INR: 4.1

## 2015-01-24 NOTE — Progress Notes (Signed)
Renal artery duplex performed  

## 2015-01-25 ENCOUNTER — Other Ambulatory Visit (HOSPITAL_COMMUNITY): Payer: Self-pay | Admitting: *Deleted

## 2015-01-25 ENCOUNTER — Ambulatory Visit (HOSPITAL_BASED_OUTPATIENT_CLINIC_OR_DEPARTMENT_OTHER): Payer: Medicare Other | Admitting: Cardiology

## 2015-01-25 ENCOUNTER — Ambulatory Visit (HOSPITAL_BASED_OUTPATIENT_CLINIC_OR_DEPARTMENT_OTHER): Payer: Medicare Other | Admitting: *Deleted

## 2015-01-25 DIAGNOSIS — M79604 Pain in right leg: Secondary | ICD-10-CM

## 2015-01-25 DIAGNOSIS — E119 Type 2 diabetes mellitus without complications: Secondary | ICD-10-CM | POA: Diagnosis not present

## 2015-01-25 DIAGNOSIS — I739 Peripheral vascular disease, unspecified: Secondary | ICD-10-CM | POA: Insufficient documentation

## 2015-01-25 DIAGNOSIS — I2510 Atherosclerotic heart disease of native coronary artery without angina pectoris: Secondary | ICD-10-CM | POA: Insufficient documentation

## 2015-01-25 DIAGNOSIS — I10 Essential (primary) hypertension: Secondary | ICD-10-CM | POA: Diagnosis not present

## 2015-01-25 DIAGNOSIS — E785 Hyperlipidemia, unspecified: Secondary | ICD-10-CM | POA: Insufficient documentation

## 2015-01-25 DIAGNOSIS — R0989 Other specified symptoms and signs involving the circulatory and respiratory systems: Secondary | ICD-10-CM | POA: Diagnosis not present

## 2015-01-25 DIAGNOSIS — Z87891 Personal history of nicotine dependence: Secondary | ICD-10-CM | POA: Insufficient documentation

## 2015-01-25 NOTE — Progress Notes (Signed)
LEA Doppler and Duplex performed

## 2015-01-25 NOTE — Progress Notes (Signed)
Carotid Duplex Exam Performed 

## 2015-01-29 DIAGNOSIS — H2513 Age-related nuclear cataract, bilateral: Secondary | ICD-10-CM | POA: Diagnosis not present

## 2015-01-29 DIAGNOSIS — H25013 Cortical age-related cataract, bilateral: Secondary | ICD-10-CM | POA: Diagnosis not present

## 2015-01-31 ENCOUNTER — Ambulatory Visit (INDEPENDENT_AMBULATORY_CARE_PROVIDER_SITE_OTHER): Payer: Medicare Other | Admitting: Cardiovascular Disease

## 2015-01-31 ENCOUNTER — Telehealth: Payer: Self-pay | Admitting: Cardiovascular Disease

## 2015-01-31 ENCOUNTER — Encounter: Payer: Self-pay | Admitting: Cardiovascular Disease

## 2015-01-31 VITALS — BP 130/60 | HR 78 | Ht 68.0 in | Wt 181.0 lb

## 2015-01-31 DIAGNOSIS — Z954 Presence of other heart-valve replacement: Secondary | ICD-10-CM

## 2015-01-31 DIAGNOSIS — I739 Peripheral vascular disease, unspecified: Secondary | ICD-10-CM | POA: Diagnosis not present

## 2015-01-31 DIAGNOSIS — G458 Other transient cerebral ischemic attacks and related syndromes: Secondary | ICD-10-CM

## 2015-01-31 DIAGNOSIS — I701 Atherosclerosis of renal artery: Secondary | ICD-10-CM | POA: Diagnosis not present

## 2015-01-31 DIAGNOSIS — I10 Essential (primary) hypertension: Secondary | ICD-10-CM | POA: Diagnosis not present

## 2015-01-31 DIAGNOSIS — Z952 Presence of prosthetic heart valve: Secondary | ICD-10-CM

## 2015-01-31 NOTE — Assessment & Plan Note (Signed)
Clearly has some worsening RLE claudication.  Anastomosis of aortobifem dilated  > 50% mid SFA stenosis may be able to be stented  See above for referral to Dr Gwenlyn Found for angiogram If he has stents would need to be on coumadin and plavix

## 2015-01-31 NOTE — Telephone Encounter (Signed)
New message ° ° ° ° °Returning Christine's call °

## 2015-01-31 NOTE — Assessment & Plan Note (Signed)
Normal valve function by last echo Good valve clicks on exam No AR  Has had TIA with subRx coumadin in past so must always be bridged with lovenox including for LE angiogram

## 2015-01-31 NOTE — Patient Instructions (Addendum)
Your physician recommends that you schedule a follow-up appointment in: ASAP   WITH  DR  Gwenlyn Found   DX  PVD  Your physician recommends that you continue on your current medications as directed. Please refer to the Current Medication list given to you today.

## 2015-01-31 NOTE — Assessment & Plan Note (Signed)
Reviewed duplex and not clear that this was a revirb artifact his vessel walls are very calcified and certainly this is not a surgical issue He is already anticoagulated for his AVR  Will add 81 mg ASA

## 2015-01-31 NOTE — Progress Notes (Signed)
Patient ID: Aahan Marques, male   DOB: 12-10-1945, 69 y.o.   MRN: [id number] 69 y.o. S/P complex redo vascular surgery by Dr Donnetta Hutching 2/13 . Previous left groin infection with vascular surgery. Resting claudication resolved but still with mild sensory neuropathy. S/P AVR on coumadin. No SSCP, fevers palpitations or dyspnea. Compliant with meds and coumadin followed at our clinic. LLE edema post op Venous duplex  3/18 normal with no DVT   Admitted in 5/14 With CVA to frontal lobe INR only 1.52 He admits to missing a couple of coumadin appts Echo 5/31 ok including valve   - Left ventricle: The cavity size was normal. Wall thickness was increased in a pattern of moderate LVH. Systolic function was normal. The estimated ejection fraction was in the range of 60% to 65%. Wall motion was normal; there were no regional wall motion abnormalities. - Aortic valve: A [employer] mechanical prosthesis was present and functioning normally. - Mitral valve: Mildly to moderately calcified annulus. Mildly calcified leaflets . - Left atrium: The atrium was mildly to moderately dilated. Impressions:  - No cardiac source of embolism was identified, but cannot be ruled out on the basis of this examination. A TEE would better evaluate the prosthetic valve   MRA with 70% stenosis in right subclavian  IMPRESSION:  1. 70% stenosis of the proximal right subclavian artery proximal to the vertebral artery origin. 2. Mild stenoses of the vertebral artery origins bilaterally are less than 50% relative to the distal vessels. 3. Slight atherosclerotic irregularity of the right carotid bifurcation without a significant stenosis. 4. No significant disease at the left carotid bifurcation.  Mild claudication RLE  After walking / running over 2 miles  INR;s have been Rx on high side which is where we want them   Needs lovenox bridging when ever coumadin stopped INR goal 3-3.5   Reviewed recent testing    01/26/15  Carotid 40-59% RICA calcified plaque small mobile density in CCA not clear that it is not artifact 01/24/15  >60% bilateral RAS  RAR 4.8 with peak velocity 3.29 on right and RAR 5.04 peak velocity 3.43 on left 2/20  ABI"s non compressable  Focal mid RSFA stenosis >50% peak velocity 3.75 m/sec   Comes in today to discuss testing  Reviewed home BP readings and they are quite high averaging 170/90 mmHg  ROS: Denies fever, malais, weight loss, blurry vision, decreased visual acuity, cough, sputum, SOB, hemoptysis, pleuritic pain, palpitaitons, heartburn, abdominal pain, melena, lower extremity edema, claudication, or rash.  All other systems reviewed and negative  General: Affect appropriate Healthy:  appears stated age 69: normal Neck supple with no adenopathy JVP normal right  bruits no thyromegaly Lungs clear with no wheezing and good diaphragmatic motion Heart:  U2/G2 click SEM no AR  murmur, no rub, gallop or click PMI normal Abdomen: benighn, BS positve, no tenderness, no AAA Bilateral femoral  Bruit. With surgical scars  No HSM or HJR Bilateral femoral bruits post vascular surgery with decreased pulses on right  No edema Neuro non-focal Skin warm and dry No muscular weakness   Current Outpatient Prescriptions  Medication Sig Dispense Refill  . amLODipine (NORVASC) 5 MG tablet Take 5 mg by mouth daily.     [name] atorvastatin (LIPITOR) 40 MG tablet Take 40 mg by mouth daily.     . cyclobenzaprine (FLEXERIL) 10 MG tablet Take 10 mg by mouth 2 (two) times daily as needed.  0  . glyBURIDE (DIABETA) 2.5 MG tablet  Take 5 mg by mouth 2 (two) times daily with a meal.    . hydrochlorothiazide (MICROZIDE) 12.5 MG capsule TAKE ONE CAPSULE BY MOUTH EVERY DAY 30 capsule 5  . metFORMIN (GLUCOPHAGE) 500 MG tablet Take 1,000 mg by mouth 2 (two) times daily with a meal.     . metoprolol succinate (TOPROL-XL) 50 MG 24 hr tablet TAKE 1 TABLET BY MOUTH EVERY DAY 30 tablet 0  .  trandolapril (MAVIK) 4 MG tablet TAKE 1 TABLET BY MOUTH EVERY DAY 30 tablet 5  . VIAGRA 100 MG tablet   5  . warfarin (COUMADIN) 7.5 MG tablet 1 tablet daily except 1.5 tablets on Saturdays OR AS DIRECTED BY COUMADADIN CLINIC 40 tablet 2  . ZETIA 10 MG tablet Take 10 mg by mouth daily.  11   No current facility-administered medications for this visit.    Allergies  Review of patient's allergies indicates no known allergies.  Electrocardiogram:  SR rate 68 normal  07/20/14   2/1 16  SR rate 68 normal   Assessment and Plan

## 2015-01-31 NOTE — Telephone Encounter (Signed)
SPOKE  WITH PT    WILL ADDRESS  RESULTS  AT   TODAY'S  OFFICE  VISIT .Adonis Housekeeper

## 2015-01-31 NOTE — Assessment & Plan Note (Signed)
Suspect from RAS given results of duplex.  Spoke with Dr Burt Knack who saw him for PV last and he really is not doing this anymore  Will refer him to Dr Gwenlyn Found.  Will need angio of LE;s and renals with possible stenting of both renals and mid R SFA.  Will need to bridge coumadin with lovenox  Would not refer back to Dr Donnetta Hutching unless surgery needed

## 2015-02-01 ENCOUNTER — Other Ambulatory Visit: Payer: Self-pay | Admitting: Cardiovascular Disease

## 2015-02-07 ENCOUNTER — Ambulatory Visit (INDEPENDENT_AMBULATORY_CARE_PROVIDER_SITE_OTHER): Payer: Medicare Other

## 2015-02-07 DIAGNOSIS — G459 Transient cerebral ischemic attack, unspecified: Secondary | ICD-10-CM

## 2015-02-07 DIAGNOSIS — Z5181 Encounter for therapeutic drug level monitoring: Secondary | ICD-10-CM

## 2015-02-07 DIAGNOSIS — I359 Nonrheumatic aortic valve disorder, unspecified: Secondary | ICD-10-CM | POA: Diagnosis not present

## 2015-02-07 LAB — POCT INR: INR: 2.3

## 2015-02-08 ENCOUNTER — Encounter: Payer: Self-pay | Admitting: Cardiovascular Disease

## 2015-02-08 ENCOUNTER — Ambulatory Visit (INDEPENDENT_AMBULATORY_CARE_PROVIDER_SITE_OTHER): Payer: Medicare Other | Admitting: Cardiovascular Disease

## 2015-02-08 VITALS — BP 118/60 | HR 66 | Ht 68.0 in | Wt 180.0 lb

## 2015-02-08 DIAGNOSIS — Z01812 Encounter for preprocedural laboratory examination: Secondary | ICD-10-CM | POA: Diagnosis not present

## 2015-02-08 DIAGNOSIS — D689 Coagulation defect, unspecified: Secondary | ICD-10-CM

## 2015-02-08 DIAGNOSIS — I701 Atherosclerosis of renal artery: Secondary | ICD-10-CM

## 2015-02-08 DIAGNOSIS — Z87891 Personal history of nicotine dependence: Secondary | ICD-10-CM

## 2015-02-08 DIAGNOSIS — R5383 Other fatigue: Secondary | ICD-10-CM

## 2015-02-08 DIAGNOSIS — Z79899 Other long term (current) drug therapy: Secondary | ICD-10-CM | POA: Diagnosis not present

## 2015-02-08 DIAGNOSIS — I739 Peripheral vascular disease, unspecified: Secondary | ICD-10-CM

## 2015-02-08 NOTE — Progress Notes (Signed)
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 His primary care physician is Dr. Virgina [name]. He was referred by Dr. Johnsie [name] for peripheral vascular evaluation because of resistant hypertension with duplex evidence of bilateral renal artery stenosis as well as new-onset right calf claudication with recent Dopplers that showed a lesion in his mid right SFA. He does have a history of remote tobacco abuse as well as treated hypertension, hyperlipidemia and diabetes. He has had coronary bypass grafting and St. Jude aVR performed by Dr. Cyndia [name] December 2004 on Coumadin anticoagulation. He has also had aortobifemoral bypass grafting, left external iliac stenting and left common femoral endarterectomy performed by Dr. Donnetta [name].   Current Outpatient Prescriptions  Medication Sig Dispense Refill  . amLODipine (NORVASC) 5 MG tablet Take 5 mg by mouth daily.     [name] atorvastatin (LIPITOR) 40 MG tablet Take 40 mg by mouth daily.     [name] glyBURIDE (DIABETA) 2.5 MG tablet Take 5 mg by mouth 2 (two) times daily with a meal.    . hydrochlorothiazide (MICROZIDE) 12.5 MG capsule TAKE ONE CAPSULE BY MOUTH EVERY DAY 30 capsule 5  . metFORMIN (GLUCOPHAGE) 500 MG tablet Take 1,000 mg by mouth 2 (two) times daily with a meal.     . metoprolol succinate (TOPROL-XL) 50 MG 24 hr tablet TAKE 1 TABLET BY MOUTH EVERY DAY 30 tablet 5  . trandolapril (MAVIK) 4 MG tablet TAKE 1 TABLET BY MOUTH EVERY DAY 30 tablet 5  . VIAGRA 100 MG tablet   5  . warfarin (COUMADIN) 7.5 MG tablet 1 tablet daily except 1.5 tablets on Saturdays OR AS  DIRECTED BY COUMADADIN CLINIC 40 tablet 2  . ZETIA 10 MG tablet Take 10 mg by mouth daily.  11   No current facility-administered medications for this visit.    No Known Allergies  History   Social History  . Marital Status: Divorced    Spouse Name: N/A  . Number of Children: N/A  . Years of Education: N/A   Occupational History  . Not on file.   Social History Main Topics  . Smoking status: Former Smoker -- 1.50 packs/day for 30 years    Types: Cigarettes    Quit date: 12/15/1993  . Smokeless tobacco: Never Used  . Alcohol Use: 7.0 oz/week    14 drink(s) per week     Comment: drinks 2 martini's a night  . Drug Use: No  . Sexual Activity: Yes   Other Topics Concern  . Not on file   Social History Narrative   Tries to remain active.  Frequent golfer but claudication limits this.     Review of Systems: General: negative for chills, fever, night sweats or weight changes.  Cardiovascular: negative for chest pain, dyspnea on exertion, edema, orthopnea, palpitations, paroxysmal nocturnal dyspnea or shortness of breath Dermatological: negative for rash Respiratory: negative for cough or wheezing Urologic: negative for hematuria Abdominal: negative for nausea, vomiting, diarrhea, bright red blood per rectum, melena, or hematemesis Neurologic: negative for visual changes, syncope, or dizziness All other systems reviewed and are otherwise negative except as  noted above.    Blood pressure 118/60, pulse 66, height 5\' 8"  (1.727 m), weight 180 lb (81.647 kg).  General appearance: alert and no distress Neck: no adenopathy, no carotid bruit, no JVD, supple, symmetrical, trachea midline and thyroid not enlarged, symmetric, no tenderness/mass/nodules Lungs: clear to auscultation bilaterally Heart: Crispinprosthetic valve sounds Abdomen: soft, non-tender; bowel sounds normal; no masses,  no organomegaly Pulses: 2+ and symmetric  EKG not performed today  ASSESSMENT AND PLAN:    Peripheral vascular disease Status post aortobifemoral bypass grafting along with left external iliac artery stenting and femoral endarterectomy 02/2011. He has recurrent right calf claudication with recent Dopplers performed 01/25/15 revealing a high-grade mid right SFA stenosis. I'm going to perform angiography on him to assess his renal arteries and we'll image his lower extremities as well at that time to determine whether or not he has percutaneously revascularize able.   Renal artery stenosis, native, bilateral [name] does have persistent hypertension on 4 antihypertensive medications with recent renal Dopplers that suggested hemodynamically significant bilateral renal artery stenosis. Based on this and go to arrange for him to undergo angiography and potential intervention.       [name] Harp MD FACP,FACC,FAHA, [facility] 02/08/2015 11:45 AM

## 2015-02-08 NOTE — Assessment & Plan Note (Signed)
Mr. Menzer does have persistent hypertension on 4 antihypertensive medications with recent renal Dopplers that suggested hemodynamically significant bilateral renal artery stenosis. Based on this and go to arrange for him to undergo angiography and potential intervention.

## 2015-02-08 NOTE — Patient Instructions (Signed)
Dr. Gwenlyn Found has ordered a Peripheral Angiogram (Renal Art and Lower Ext Art Angiograms; Right Groin) to be done at [facility].  This procedure is going to look at the bloodflow in your lower extremities.  If Dr. Gwenlyn Found is able to open up the arteries, you will have to spend one night in the hospital.  If he is not able to open the arteries, you will be able to go home that same day.    After the procedure, you will not be allowed to drive for 3 days or push, pull, or lift anything greater than 10 lbs for one week.    You will be required to have the following tests prior to the procedure:  1. Blood work-the blood work can be done no more than 7 days prior to the procedure.  It can be done at any [facility] lab.  There is one downstairs on the first floor of this building and one in the [place] ([address])  2. Chest Xray-the chest xray order has already been placed at the [place].     *REPS NONE  Erasmo Downer will be in touch about Lovenox bridging for your procedure.

## 2015-02-08 NOTE — Assessment & Plan Note (Signed)
Status post aortobifemoral bypass grafting along with left external iliac artery stenting and femoral endarterectomy 02/2011. He has recurrent right calf claudication with recent Dopplers performed 01/25/15 revealing a high-grade mid right SFA stenosis. I'm going to perform angiography on him to assess his renal arteries and we'll image his lower extremities as well at that time to determine whether or not he has percutaneously revascularize able.

## 2015-02-09 NOTE — Progress Notes (Signed)
Thanks JB-  Will need lovenox bridging to get ready for angio  Has had TIA off coumadin before with ARV

## 2015-02-12 ENCOUNTER — Other Ambulatory Visit: Payer: Self-pay | Admitting: Pharmacist Clinician (PhC)/ Clinical Pharmacy Specialist

## 2015-02-12 ENCOUNTER — Telehealth: Payer: Self-pay | Admitting: Pharmacist Clinician (PhC)/ Clinical Pharmacy Specialist

## 2015-02-12 MED ORDER — ENOXAPARIN SODIUM 80 MG/0.8ML ~~LOC~~ SOLN: 80.0000 mg | Freq: Two times a day (BID) | SUBCUTANEOUS | Status: DC

## 2015-02-12 NOTE — Telephone Encounter (Signed)
lovenox bridge information sent to patient.  Note on bridge will actually start am of Sun March 13 and also take a morning injection on Wed March 16.  Instructions given to patient over phone then emailed thru [facility] email system.

## 2015-02-12 NOTE — Telephone Encounter (Signed)
Labs received from [facility] - Dr. Keane Police office.  Scr 0.8, CrCl 100 ml/min  Note on bridge below will also do one dose of enoxaparin at 8 am on 02/20/15.  Note closed out before could enter information  Appt changed to 3/22 at 3 pm

## 2015-02-12 NOTE — Telephone Encounter (Signed)
Enoxaprin Dosing Schedule  Enoxparin dose:  80 mg  Date  Warfarin Dose (evenings) Enoxaprin Dose  3/11 6 7.5 mg  (1 tab)   3/12 5 0   3/13 4 0             8 pm  3/14 3 0 8 am    8 pm  3/15 2 0 8 am    8 pm  3/16 1 0   3/17 Procedure 7.5 mg  (1 tab)   3/18 1 15  mg (2 tabs) 8 am    8 pm  3/19 2 15  mg (2 tabs) 8 am    8 pm  3/20 3 11.25 mg (1.5 tabs) 8 am    8 pm  3/21 4 11.25 mg (1.5 tabs) 8 am    8 pm  3/22 5 Repeat INR 8 am  3/23 6

## 2015-02-18 ENCOUNTER — Ambulatory Visit
Admission: RE | Admit: 2015-02-18 | Discharge: 2015-02-18 | Disposition: A | Discharge status: Home / Self Care | Payer: Medicare Other | Source: Ambulatory Visit | Attending: Cardiovascular Disease | Admitting: Cardiovascular Disease

## 2015-02-18 DIAGNOSIS — Z01812 Encounter for preprocedural laboratory examination: Secondary | ICD-10-CM

## 2015-02-18 DIAGNOSIS — R5383 Other fatigue: Secondary | ICD-10-CM | POA: Diagnosis not present

## 2015-02-18 DIAGNOSIS — Z79899 Other long term (current) drug therapy: Secondary | ICD-10-CM | POA: Diagnosis not present

## 2015-02-18 DIAGNOSIS — Z01810 Encounter for preprocedural cardiovascular examination: Secondary | ICD-10-CM | POA: Diagnosis not present

## 2015-02-18 DIAGNOSIS — D689 Coagulation defect, unspecified: Secondary | ICD-10-CM | POA: Diagnosis not present

## 2015-02-18 DIAGNOSIS — Z87891 Personal history of nicotine dependence: Secondary | ICD-10-CM | POA: Diagnosis not present

## 2015-02-18 DIAGNOSIS — I700 Atherosclerosis of aorta: Secondary | ICD-10-CM | POA: Diagnosis not present

## 2015-02-18 DIAGNOSIS — Z951 Presence of aortocoronary bypass graft: Secondary | ICD-10-CM | POA: Diagnosis not present

## 2015-02-18 LAB — PROTIME-INR
INR: 1.4 (ref <1.50)
Prothrombin Time: 17.2 seconds — ABNORMAL HIGH (ref 11.6–15.2)

## 2015-02-18 LAB — APTT: aPTT: 41 seconds — ABNORMAL HIGH (ref 24–37)

## 2015-02-19 ENCOUNTER — Encounter: Payer: Self-pay | Admitting: *Deleted

## 2015-02-19 LAB — CBC
HCT: 46.3 % (ref 39.0–52.0)
HEMOGLOBIN: 15 g/dL (ref 13.0–17.0)
MCH: 31 pg (ref 26.0–34.0)
MCHC: 32.4 g/dL (ref 30.0–36.0)
MCV: 95.7 fL (ref 78.0–100.0)
MPV: 11.8 fL (ref 8.6–12.4)
Platelets: 267 10*3/uL (ref 150–400)
RBC: 4.84 MIL/uL (ref 4.22–5.81)
RDW: 14.2 % (ref 11.5–15.5)
WBC: 6.8 10*3/uL (ref 4.0–10.5)

## 2015-02-19 LAB — BASIC METABOLIC PANEL
BUN: 11 mg/dL (ref 6–23)
CO2: 25 meq/L (ref 19–32)
Calcium: 9.1 mg/dL (ref 8.4–10.5)
Chloride: 102 mEq/L (ref 96–112)
Creat: 0.7 mg/dL (ref 0.50–1.35)
GLUCOSE: 159 mg/dL — AB (ref 70–99)
POTASSIUM: 4.9 meq/L (ref 3.5–5.3)
Sodium: 138 mEq/L (ref 135–145)

## 2015-02-19 LAB — TSH: TSH: 1.059 u[IU]/mL (ref 0.350–4.500)

## 2015-02-21 ENCOUNTER — Observation Stay (HOSPITAL_COMMUNITY)
Admission: RE | Admit: 2015-02-21 | Discharge: 2015-02-22 | Disposition: A | Discharge status: Home / Self Care | Payer: Medicare Other | Source: Ambulatory Visit | Attending: Cardiovascular Disease | Admitting: Cardiovascular Disease

## 2015-02-21 ENCOUNTER — Encounter (HOSPITAL_COMMUNITY)
Admission: RE | Disposition: A | Discharge status: Home / Self Care | Payer: Self-pay | Source: Ambulatory Visit | Attending: Cardiovascular Disease

## 2015-02-21 ENCOUNTER — Encounter (HOSPITAL_COMMUNITY): Payer: Self-pay | Admitting: Cardiovascular Disease

## 2015-02-21 DIAGNOSIS — I10 Essential (primary) hypertension: Secondary | ICD-10-CM | POA: Diagnosis not present

## 2015-02-21 DIAGNOSIS — I701 Atherosclerosis of renal artery: Principal | ICD-10-CM | POA: Diagnosis present

## 2015-02-21 DIAGNOSIS — Z95820 Peripheral vascular angioplasty status with implants and grafts: Secondary | ICD-10-CM | POA: Insufficient documentation

## 2015-02-21 DIAGNOSIS — Z79899 Other long term (current) drug therapy: Secondary | ICD-10-CM | POA: Insufficient documentation

## 2015-02-21 DIAGNOSIS — R5383 Other fatigue: Secondary | ICD-10-CM

## 2015-02-21 DIAGNOSIS — Z87891 Personal history of nicotine dependence: Secondary | ICD-10-CM | POA: Insufficient documentation

## 2015-02-21 DIAGNOSIS — I70211 Atherosclerosis of native arteries of extremities with intermittent claudication, right leg: Secondary | ICD-10-CM | POA: Insufficient documentation

## 2015-02-21 DIAGNOSIS — E785 Hyperlipidemia, unspecified: Secondary | ICD-10-CM | POA: Insufficient documentation

## 2015-02-21 DIAGNOSIS — Z7901 Long term (current) use of anticoagulants: Secondary | ICD-10-CM | POA: Insufficient documentation

## 2015-02-21 DIAGNOSIS — E119 Type 2 diabetes mellitus without complications: Secondary | ICD-10-CM

## 2015-02-21 DIAGNOSIS — Z951 Presence of aortocoronary bypass graft: Secondary | ICD-10-CM | POA: Diagnosis not present

## 2015-02-21 DIAGNOSIS — I739 Peripheral vascular disease, unspecified: Secondary | ICD-10-CM

## 2015-02-21 DIAGNOSIS — I70219 Atherosclerosis of native arteries of extremities with intermittent claudication, unspecified extremity: Secondary | ICD-10-CM | POA: Diagnosis present

## 2015-02-21 DIAGNOSIS — Z01812 Encounter for preprocedural laboratory examination: Secondary | ICD-10-CM

## 2015-02-21 DIAGNOSIS — I70213 Atherosclerosis of native arteries of extremities with intermittent claudication, bilateral legs: Secondary | ICD-10-CM | POA: Diagnosis not present

## 2015-02-21 DIAGNOSIS — D689 Coagulation defect, unspecified: Secondary | ICD-10-CM

## 2015-02-21 HISTORY — PX: LOWER EXTREMITY ANGIOGRAM: SHX5508

## 2015-02-21 HISTORY — PX: RENAL ANGIOGRAM: SHX5509

## 2015-02-21 LAB — GLUCOSE, CAPILLARY
GLUCOSE-CAPILLARY: 180 mg/dL — AB (ref 70–99)
GLUCOSE-CAPILLARY: 142 mg/dL — AB (ref 70–99)
Glucose-Capillary: 334 mg/dL — ABNORMAL HIGH (ref 70–99)

## 2015-02-21 LAB — POCT ACTIVATED CLOTTING TIME
ACTIVATED CLOTTING TIME: 183 s
ACTIVATED CLOTTING TIME: 208 s

## 2015-02-21 LAB — PROTIME-INR
INR: 0.95 (ref 0.00–1.49)
PROTHROMBIN TIME: 12.8 s (ref 11.6–15.2)

## 2015-02-21 SURGERY — ANGIOGRAM, LOWER EXTREMITY
Anesthesia: LOCAL

## 2015-02-21 MED ORDER — LIDOCAINE HCL (PF) 1 % IJ SOLN
INTRAMUSCULAR | Status: AC
Start: 2015-02-21 — End: 2015-02-21
  Filled 2015-02-21: qty 30

## 2015-02-21 MED ORDER — METOPROLOL SUCCINATE ER 50 MG PO TB24
50.0000 mg | ORAL_TABLET | Freq: Every day | ORAL | Status: DC
  Administered 2015-02-22: 11:00:00 50 mg via ORAL
  Filled 2015-02-21: qty 1

## 2015-02-21 MED ORDER — HEPARIN (PORCINE) IN NACL 2-0.9 UNIT/ML-% IJ SOLN
INTRAMUSCULAR | Status: AC
  Filled 2015-02-21: qty 1000

## 2015-02-21 MED ORDER — MIDAZOLAM HCL 2 MG/2ML IJ SOLN
INTRAMUSCULAR | Status: AC
  Filled 2015-02-21: qty 2

## 2015-02-21 MED ORDER — SODIUM CHLORIDE 0.9 % IV SOLN
INTRAVENOUS | Status: AC
Start: 2015-02-21 — End: 2015-02-22

## 2015-02-21 MED ORDER — HEPARIN SODIUM (PORCINE) 1000 UNIT/ML IJ SOLN
INTRAMUSCULAR | Status: AC
  Filled 2015-02-21: qty 1

## 2015-02-21 MED ORDER — MORPHINE SULFATE 2 MG/ML IJ SOLN: 2.0000 mg | INTRAMUSCULAR | Status: DC | PRN

## 2015-02-21 MED ORDER — TRANDOLAPRIL 4 MG PO TABS
4.0000 mg | ORAL_TABLET | Freq: Every day | ORAL | Status: DC
  Administered 2015-02-22: 4 mg via ORAL
  Filled 2015-02-21: qty 1

## 2015-02-21 MED ORDER — FENTANYL CITRATE 0.05 MG/ML IJ SOLN
INTRAMUSCULAR | Status: AC
  Filled 2015-02-21: qty 2

## 2015-02-21 MED ORDER — GLYBURIDE 2.5 MG PO TABS
2.5000 mg | ORAL_TABLET | Freq: Every day | ORAL | Status: DC
  Filled 2015-02-21: qty 1

## 2015-02-21 MED ORDER — ASPIRIN 81 MG PO CHEW
81.0000 mg | CHEWABLE_TABLET | ORAL | Status: AC
  Administered 2015-02-21: 81 mg via ORAL

## 2015-02-21 MED ORDER — GLYBURIDE 5 MG PO TABS
5.0000 mg | ORAL_TABLET | Freq: Every day | ORAL | Status: DC
  Administered 2015-02-22: 07:00:00 5 mg via ORAL
  Filled 2015-02-21 (×2): qty 1

## 2015-02-21 MED ORDER — GLYBURIDE 2.5 MG PO TABS: 2.5000 mg | ORAL_TABLET | Freq: Two times a day (BID) | ORAL | Status: DC

## 2015-02-21 MED ORDER — AMLODIPINE BESYLATE 10 MG PO TABS
10.0000 mg | ORAL_TABLET | Freq: Every day | ORAL | Status: DC
  Administered 2015-02-22: 11:00:00 10 mg via ORAL
  Filled 2015-02-21: qty 1

## 2015-02-21 MED ORDER — EZETIMIBE 10 MG PO TABS
10.0000 mg | ORAL_TABLET | Freq: Every day | ORAL | Status: DC
  Administered 2015-02-22: 10 mg via ORAL
  Filled 2015-02-21: qty 1

## 2015-02-21 MED ORDER — GUAIFENESIN-DM 100-10 MG/5ML PO SYRP
5.0000 mL | ORAL_SOLUTION | ORAL | Status: DC | PRN
  Administered 2015-02-21: 5 mL via ORAL
  Filled 2015-02-21 (×2): qty 5

## 2015-02-21 MED ORDER — CLOPIDOGREL BISULFATE 300 MG PO TABS
ORAL_TABLET | ORAL | Status: AC
  Filled 2015-02-21: qty 1

## 2015-02-21 MED ORDER — ACETAMINOPHEN 325 MG PO TABS: 650.0000 mg | ORAL_TABLET | ORAL | Status: DC | PRN

## 2015-02-21 MED ORDER — ASPIRIN 81 MG PO CHEW
CHEWABLE_TABLET | ORAL | Status: AC
  Filled 2015-02-21: qty 1

## 2015-02-21 MED ORDER — ONDANSETRON HCL 4 MG/2ML IJ SOLN: 4.0000 mg | Freq: Four times a day (QID) | INTRAMUSCULAR | Status: DC | PRN

## 2015-02-21 MED ORDER — SODIUM CHLORIDE 0.9 % IJ SOLN: 3.0000 mL | INTRAMUSCULAR | Status: DC | PRN

## 2015-02-21 MED ORDER — SODIUM CHLORIDE 0.9 % IV SOLN
INTRAVENOUS | Status: DC
  Administered 2015-02-21: 12:00:00 via INTRAVENOUS

## 2015-02-21 MED ORDER — ATORVASTATIN CALCIUM 40 MG PO TABS
40.0000 mg | ORAL_TABLET | Freq: Every day | ORAL | Status: DC
  Administered 2015-02-22: 11:00:00 40 mg via ORAL
  Filled 2015-02-21: qty 1

## 2015-02-21 MED ORDER — HYDROCHLOROTHIAZIDE 12.5 MG PO CAPS
12.5000 mg | ORAL_CAPSULE | Freq: Every day | ORAL | Status: DC
  Administered 2015-02-22: 11:00:00 12.5 mg via ORAL
  Filled 2015-02-21: qty 1

## 2015-02-21 NOTE — H&P (View-Only) (Signed)
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 His primary care physician is Dr. Virgina Jock. He was referred by Dr. Johnsie Cancel for peripheral vascular evaluation because of resistant hypertension with duplex evidence of bilateral renal artery stenosis as well as new-onset right calf claudication with recent Dopplers that showed a lesion in his mid right SFA. He does have a history of remote tobacco abuse as well as treated hypertension, hyperlipidemia and diabetes. He has had coronary bypass grafting and St. Jude aVR performed by Dr. Cyndia Bent December 2004 on Coumadin anticoagulation. He has also had aortobifemoral bypass grafting, left external iliac stenting and left common femoral endarterectomy performed by Dr. Donnetta Hutching.   Current Outpatient Prescriptions  Medication Sig Dispense Refill  . amLODipine (NORVASC) 5 MG tablet Take 5 mg by mouth daily.     [name] atorvastatin (LIPITOR) 40 MG tablet Take 40 mg by mouth daily.     [name] glyBURIDE (DIABETA) 2.5 MG tablet Take 5 mg by mouth 2 (two) times daily with a meal.    . hydrochlorothiazide (MICROZIDE) 12.5 MG capsule TAKE ONE CAPSULE BY MOUTH EVERY DAY 30 capsule 5  . metFORMIN (GLUCOPHAGE) 500 MG tablet Take 1,000 mg by mouth 2 (two) times daily with a meal.     . metoprolol succinate (TOPROL-XL) 50 MG 24 hr tablet TAKE 1 TABLET BY MOUTH EVERY DAY 30 tablet 5  . trandolapril (MAVIK) 4 MG tablet TAKE 1 TABLET BY MOUTH EVERY DAY 30 tablet 5  . VIAGRA 100 MG tablet   5  . warfarin (COUMADIN) 7.5 MG tablet 1 tablet daily except 1.5 tablets on Saturdays OR AS  DIRECTED BY COUMADADIN CLINIC 40 tablet 2  . ZETIA 10 MG tablet Take 10 mg by mouth daily.  11   No current facility-administered medications for this visit.    No Known Allergies  History   Social History  . Marital Status: Divorced    Spouse Name: N/A  . Number of Children: N/A  . Years of Education: N/A   Occupational History  . Not on file.   Social History Main Topics  . Smoking status: Former Smoker -- 1.50 packs/day for 30 years    Types: Cigarettes    Quit date: 12/15/1993  . Smokeless tobacco: Never Used  . Alcohol Use: 7.0 oz/week    14 drink(s) per week     Comment: drinks 2 martini's a night  . Drug Use: No  . Sexual Activity: Yes   Other Topics Concern  . Not on file   Social History Narrative   Tries to remain active.  Frequent golfer but claudication limits this.     Review of Systems: General: negative for chills, fever, night sweats or weight changes.  Cardiovascular: negative for chest pain, dyspnea on exertion, edema, orthopnea, palpitations, paroxysmal nocturnal dyspnea or shortness of breath Dermatological: negative for rash Respiratory: negative for cough or wheezing Urologic: negative for hematuria Abdominal: negative for nausea, vomiting, diarrhea, bright red blood per rectum, melena, or hematemesis Neurologic: negative for visual changes, syncope, or dizziness All other systems reviewed and are otherwise negative except as  noted above.    Blood pressure 118/60, pulse 66, height 5\' 8"  (1.727 m), weight 180 lb (81.647 kg).  General appearance: alert and no distress Neck: no adenopathy, no carotid bruit, no JVD, supple, symmetrical, trachea midline and thyroid not enlarged, symmetric, no tenderness/mass/nodules Lungs: clear to auscultation bilaterally Heart: Crispinprosthetic valve sounds Abdomen: soft, non-tender; bowel sounds normal; no masses,  no organomegaly Pulses: 2+ and symmetric  EKG not performed today  ASSESSMENT AND PLAN:    Peripheral vascular disease Status post aortobifemoral bypass grafting along with left external iliac artery stenting and femoral endarterectomy 02/2011. He has recurrent right calf claudication with recent Dopplers performed 01/25/15 revealing a high-grade mid right SFA stenosis. I'm going to perform angiography on him to assess his renal arteries and we'll image his lower extremities as well at that time to determine whether or not he has percutaneously revascularize able.   Renal artery stenosis, native, bilateral [name] [name] does have persistent hypertension on 4 antihypertensive medications with recent renal Dopplers that suggested hemodynamically significant bilateral renal artery stenosis. Based on this and go to arrange for him to undergo angiography and potential intervention.       Lorretta Harp MD FACP,FACC,FAHA, [facility] 02/08/2015 11:45 AM

## 2015-02-21 NOTE — CV Procedure (Signed)
Aime Meloche is a 69 y.o. male    [id number] LOCATION:  FACILITY: [facility]  PHYSICIAN: Quay Burow, M.D. Jun 25, 1946   DATE OF PROCEDURE:  02/21/2015  DATE OF DISCHARGE:     PV Angiogram/Intervention    History obtained from chart review.Mr. 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 His primary care physician is Dr. Virgina Jock. He was referred by Dr. Johnsie Cancel for peripheral vascular evaluation because of resistant hypertension with duplex evidence of bilateral renal artery stenosis as well as new-onset right calf claudication with recent Dopplers that showed a lesion in his mid right SFA. He does have a history of remote tobacco abuse as well as treated hypertension, hyperlipidemia and diabetes. He has had coronary bypass grafting and St. Jude aVR performed by Dr. Cyndia Bent December 2004 on Coumadin anticoagulation. He has also had aortobifemoral bypass grafting, left external iliac stenting and left common femoral endarterectomy performed by Dr. Donnetta Hutching.   PROCEDURE DESCRIPTION:   The patient was brought to the second floor [facility] lab in the postabsorptive state. He was premedicated with Valium 5 mg by mouth, IV Versed and fentanyl. His right groin was prepped and shaved in usual sterile fashion. Xylocaine 1% was used for local anesthesia. A 5 French sheath was inserted into the right common femoral artery using standard Seldinger technique. A 5 French pigtail catheter was placed at the level of the renal arteries. Abdominal aortography photography, bilateral iliac angiography and bifemoral runoff was performed using bolus trace digital subtraction step table technique. Visipaque dye was used for the entirety of the case. Retrograde  aortic, left ventricular and pullback pressures were recorded.   HEMODYNAMICS:    AO SYSTOLIC/AO DIASTOLIC: 488/89   Angiographic Data:   1: Abdominal aortogram-widely patent right renal artery, 80% left renal artery stenosis. Intact aortobifemoral bypass graft.  2: Left lower extremity-widely patent left common femoral endarterectomy site, normal SFA with 3 vessel runoff  3: Right lower extremity-90% focal calcified mid right SFA stenosis with three-vessel runoff  IMPRESSION:Mr. Schwandt has a widely patent right renal artery and an 80% left renal artery stenosis. We will proceed with PTA and stenting of his left renal artery for treatment of presumed renal vascular hypertension.  Procedure Description:the patient received a patches of heparin intravenously with an ending ACT at 208. A total of 140 mL of contrast with the patient. Using a 6 [place] JR4 short guide catheter along with an 014 stabilizer wire and a 4 mm x 15 mm long balloon predilatation was performed after the lesion was crossed with wire. Stenting was performed with a 6 mm x 12 mm long Herculink balloon expandable stent up to 10 atm resulting in reduction of an 80% stenosis to 0% residual with excellent flow. The patient tolerated the procedure well. The guidewire and catheter were removed. The sheath was secured. The patient left the lab in stable condition.  Final Impression: successful left renal artery stenting using a balloon-expandable stent for treatment of renal vascular hypertension. The patient does have a focal calcific lesion in the mid right SFA which may be responsible for his high-frequency signal and a calf claudication. We will discuss treatment options was seen back in the office. The sheath will be removed and pressure held on the groin  to achieve hemostasis. Patient will be hydrated overnight. Lovenox will be started for bridging to Coumadin anticoagulation because of his St. Jude AVR. Plavix started and aspirin  held. He will get renal Dopplers and I notified office next week and he will see mid-level provider back in 2-3 weeks on a daily vitamin the office. The plan is to keep him on Coumadin and Plavix for 3 months after which I will restart low-dose aspirin and discontinue Plavix.    Lorretta Harp MD, [facility] 02/21/2015 4:12 PM

## 2015-02-21 NOTE — Progress Notes (Addendum)
Site area: rt groin Site Prior to Removal:  Level  0 Pressure Applied For:  30  minutes Manual:   yes Patient Status During Pull:  stable Post Pull Site:  Level  0 Post Pull Instructions Given:  yes Post Pull Pulses Present: yes Dressing Applied:  tegaderm Bedrest begins @  4818  Comments:  No complications

## 2015-02-21 NOTE — Interval H&P Note (Signed)
History and Physical Interval Note:  02/21/2015 2:54 PM  [name] [name]  has presented today for surgery, with the diagnosis of pvd, ras  The various methods of treatment have been discussed with the patient and family. After consideration of risks, benefits and other options for treatment, the patient has consented to  Procedure(s): LOWER EXTREMITY ANGIOGRAM (N/A) RENAL ANGIOGRAM (N/A) as a surgical intervention .  The patient's history has been reviewed, patient examined, no change in status, stable for surgery.  I have reviewed the patient's chart and labs.  Questions were answered to the patient's satisfaction.     Lorretta Harp

## 2015-02-21 NOTE — Progress Notes (Signed)
Friend in to visit. Eating [place] sandwich.

## 2015-02-22 ENCOUNTER — Encounter (HOSPITAL_COMMUNITY): Payer: Self-pay | Admitting: Physician Assistant

## 2015-02-22 ENCOUNTER — Other Ambulatory Visit: Payer: Self-pay | Admitting: Cardiovascular Disease

## 2015-02-22 DIAGNOSIS — I10 Essential (primary) hypertension: Secondary | ICD-10-CM | POA: Diagnosis not present

## 2015-02-22 DIAGNOSIS — I70219 Atherosclerosis of native arteries of extremities with intermittent claudication, unspecified extremity: Secondary | ICD-10-CM

## 2015-02-22 DIAGNOSIS — Z79899 Other long term (current) drug therapy: Secondary | ICD-10-CM | POA: Diagnosis not present

## 2015-02-22 DIAGNOSIS — I701 Atherosclerosis of renal artery: Secondary | ICD-10-CM | POA: Diagnosis not present

## 2015-02-22 DIAGNOSIS — E785 Hyperlipidemia, unspecified: Secondary | ICD-10-CM | POA: Diagnosis not present

## 2015-02-22 DIAGNOSIS — I70211 Atherosclerosis of native arteries of extremities with intermittent claudication, right leg: Secondary | ICD-10-CM | POA: Diagnosis not present

## 2015-02-22 DIAGNOSIS — E119 Type 2 diabetes mellitus without complications: Secondary | ICD-10-CM | POA: Diagnosis not present

## 2015-02-22 LAB — CBC
HEMATOCRIT: 40 % (ref 39.0–52.0)
Hemoglobin: 13.3 g/dL (ref 13.0–17.0)
MCH: 31.4 pg (ref 26.0–34.0)
MCHC: 33.3 g/dL (ref 30.0–36.0)
MCV: 94.3 fL (ref 78.0–100.0)
Platelets: 204 10*3/uL (ref 150–400)
RBC: 4.24 MIL/uL (ref 4.22–5.81)
RDW: 14.4 % (ref 11.5–15.5)
WBC: 6.8 10*3/uL (ref 4.0–10.5)

## 2015-02-22 LAB — BASIC METABOLIC PANEL
ANION GAP: 5 (ref 5–15)
BUN: 8 mg/dL (ref 6–23)
CALCIUM: 8.5 mg/dL (ref 8.4–10.5)
CO2: 29 mmol/L (ref 19–32)
Chloride: 103 mmol/L (ref 96–112)
Creatinine, Ser: 0.81 mg/dL (ref 0.50–1.35)
GFR calc Af Amer: >90 mL/min (ref >90)
GFR, EST NON AFRICAN AMERICAN: 89 mL/min — AB (ref >90)
Glucose, Bld: 191 mg/dL — ABNORMAL HIGH (ref 70–99)
Potassium: 4 mmol/L (ref 3.5–5.1)
SODIUM: 137 mmol/L (ref 135–145)

## 2015-02-22 LAB — GLUCOSE, CAPILLARY: GLUCOSE-CAPILLARY: 228 mg/dL — AB (ref 70–99)

## 2015-02-22 LAB — POCT ACTIVATED CLOTTING TIME
ACTIVATED CLOTTING TIME: 183 s
Activated Clotting Time: 159 seconds

## 2015-02-22 MED ORDER — CLOPIDOGREL BISULFATE 75 MG PO TABS: 75.0000 mg | ORAL_TABLET | Freq: Every day | ORAL | Status: DC

## 2015-02-22 MED ORDER — ENOXAPARIN SODIUM 80 MG/0.8ML ~~LOC~~ SOLN
80.0000 mg | Freq: Two times a day (BID) | SUBCUTANEOUS | Status: DC
  Filled 2015-02-22 (×2): qty 0.8

## 2015-02-22 MED ORDER — ENOXAPARIN SODIUM 80 MG/0.8ML ~~LOC~~ SOLN: 80.0000 mg | Freq: Two times a day (BID) | SUBCUTANEOUS | Status: DC

## 2015-02-22 NOTE — Progress Notes (Signed)
Inpatient Diabetes Program Recommendations  AACE/ADA: New Consensus Statement on Inpatient Glycemic Control (2013)  Target Ranges:  Prepandial:   less than 140 mg/dL      Peak postprandial:   less than 180 mg/dL (1-2 hours)      Critically ill patients:  140 - 180 mg/dL   Results for JONERIK, SLIKER (MRN [id number]) as of 02/22/2015 10:44  Ref. Range 02/21/2015 11:55 02/21/2015 16:28 02/21/2015 22:04 02/22/2015 07:26  Glucose-Capillary Latest Range: 70-99 mg/dL 180 (H) 142 (H) 334 (H) 228 (H)    Reason for assessment: elevated CBG  Diabetes history: Type 2 Outpatient Diabetes medications: Metformin 500mg /day, Glyburide 2.5mg  with supper, 5mg  with breakfast Current orders for Inpatient glycemic control: Glyburide 2.5mg  with supper, 5mg  with breakfast  CBG elevated- last A1C noted in the chart is a year ago- 6.5%. Consider ordering an A1C if the patient remains in hospital- if the patient is discharged, encourage patient have an A1C to determine his blood sugar control with currently ordered meds.  If patient remains in the hospital, consider adding Novolog sensitive correction tid with meals.   Gentry Fitz, RN, BA, MHA, CDE Diabetes Coordinator Inpatient Diabetes Program  [phone] (Team Pager) [phone] [facility]) 02/22/2015 10:48 AM

## 2015-02-22 NOTE — Telephone Encounter (Signed)
°  1. Which medications need to be refilled? Plavix --new prescription   2. Which pharmacy is medication to be sent to?[facility] on [address]-  3. Do they need a 30 day or 90 day supply? 90  4. Would they like a call back once the medication has been sent to the pharmacy? Yes

## 2015-02-22 NOTE — Telephone Encounter (Signed)
Rx(s) sent to pharmacy electronically. Patient notified. 

## 2015-02-22 NOTE — Discharge Summary (Signed)
CARDIOLOGY DISCHARGE SUMMARY   Patient ID: [name] [name] MRN: [id number] DOB/AGE: 1946/03/24 69 y.o.  Admit date: 02/21/2015 Discharge date: 02/22/2015  PCP: Precious Reel, MD Primary Cardiologist: Dr. Gwenlyn Found  Primary Discharge Diagnosis:  Renal artery stenosis - s/p 6 mm x 12 mm long Herculink balloon expandable stent to the left renal artery  Secondary Discharge Diagnosis:    Atherosclerosis of native arteries of extremity with intermittent claudication   Renal artery arteriosclerosis  Procedures: Abdominal aortogram, bilateral lower extremity angiogram, renal angiogram, PTA and stenting of the left renal artery  Hospital Course: [name] [name] is a 69 y.o. male with a history of CAD and PVD. He was referred to Dr. Gwenlyn Found for possible progression of peripheral vascular disease plus possible renal artery stenosis. An angiogram was scheduled and he came to the hospital for the procedure on 02/21/2015.  Procedure results are below. He had PTCA and stenting of the left renal artery. The right renal artery was widely patent. A focal calcific lesion in the mid right SFA was seen and his currently to be treated medically. He was held and hydrated overnight.  [name] has a history of aortic valve replacement and is on Coumadin for this. He was bridged with Lovenox after his Coumadin was held for the procedure and the Lovenox was resumed. He is to continue the Lovenox until his INR is therapeutic.  On 02/22/2015, he was seen by Dr. Ellyn Hack and all data were reviewed. No further inpatient workup was indicated and he was ambulating well after cath. All arrangements had been made. He is considered stable for discharge, to follow-up as an outpatient.  BP 135/57 mmHg  Pulse 75  Temp(Src) 98 F (36.7 C) (Oral)  Resp 20  Ht 5\' 8"  (1.727 m)  Wt 178 lb 9.2 oz (81 kg)  BMI 27.16 kg/m2  SpO2 96% General: Well developed, well nourished, male in no acute distress Head: Eyes  PERRLA, No xanthomas.   Normocephalic and atraumatic  Lungs: Clear bilaterally to auscultation. Heart: HRRR S1 S2, without MRG.  Pulses are 2+ & equal. No carotid bruit. No JVD.  Abdomen: Bowel sounds are present, abdomen soft and non-tender without masses or  hernias noted. Msk: Normal strength and tone for age. Extremities: No clubbing, cyanosis or edema. Cath site without hematoma    Skin:  No rashes or lesions noted. Neuro: Alert and oriented X 3. Psych:  Good affect, responds appropriately   Labs:   Lab Results  Component Value Date   WBC 6.8 02/22/2015   HGB 13.3 02/22/2015   HCT 40.0 02/22/2015   MCV 94.3 02/22/2015   PLT 204 02/22/2015     Recent Labs Lab 02/22/15 0324  NA 137  K 4.0  CL 103  CO2 29  BUN 8  CREATININE 0.81  CALCIUM 8.5  GLUCOSE 191*    Recent Labs  02/21/15 1224  INR 0.95      Radiology: Dg Chest 2 View  02/18/2015   CLINICAL DATA:  Pre angiogram. History of smoking. History of heart surgery (2004).  EXAM: CHEST  2 VIEW  COMPARISON:  05/06/2013; 01/19/2012; 03/03/2011  FINDINGS: Grossly unchanged cardiac silhouette and mediastinal contours post median sternotomy, CABG and aortic valve replacement. Moderate to large amount of calcified atherosclerotic plaque within the thoracic aorta. The lungs appear hyperexpanded with flattening the bilateral diaphragms. There is mild elevation / eventration of the anterior aspect the right hemidiaphragm. No focal airspace opacities. No pleural effusion or pneumothorax.  No evidence of edema.  IMPRESSION: 1. Hyperexpanded lungs without acute cardiopulmonary disease. 2. Post median sternotomy and aortic valve replacement. 3. Moderate to large amount of calcified atherosclerotic plaque within the thoracic aorta.   Electronically Signed   By: Sandi Mariscal M.D.   On: 02/18/2015 11:18    Peripheral Vascular Cath: 02/21/2015 Angiographic Data:  1: Abdominal aortogram-widely patent right renal artery, 80% left renal  artery stenosis. Intact aortobifemoral bypass graft. 2: Left lower extremity-widely patent left common femoral endarterectomy site, normal SFA with 3 vessel runoff 3: Right lower extremity-90% focal calcified mid right SFA stenosis with three-vessel runoff IMPRESSION:[name] has a widely patent right renal artery and an 80% left renal artery stenosis. We will proceed with PTA and stenting of his left renal artery for treatment of presumed renal vascular hypertension. Final Impression: successful left renal artery stenting using a balloon-expandable stent for treatment of renal vascular hypertension. The patient does have a focal calcific lesion in the mid right SFA which may be responsible for his high-frequency signal and a calf claudication. We will discuss treatment options was seen back in the office. The sheath will be removed and pressure held on the groin to achieve hemostasis. Patient will be hydrated overnight. Lovenox will be started for bridging to Coumadin anticoagulation because of his St. Jude AVR. Plavix started and aspirin held. He will get renal Dopplers and I notified office next week and he will see mid-level provider back in 2-3 weeks on a daily vitamin the office. The plan is to keep him on Coumadin and Plavix for 3 months after which I will restart low-dose aspirin and discontinue Plavix.  FOLLOW UP PLANS AND APPOINTMENTS No Known Allergies   Medication List    TAKE these medications        amLODipine 10 MG tablet  Commonly known as:  NORVASC  Take 10 mg by mouth daily.     atorvastatin 40 MG tablet  Commonly known as:  LIPITOR  Take 40 mg by mouth daily.     enoxaparin 80 MG/0.8ML injection  Commonly known as:  LOVENOX  Inject 0.8 mLs (80 mg total) into the skin every 12 (twelve) hours.     enoxaparin 80 MG/0.8ML injection  Commonly known as:  LOVENOX  Inject 0.8 mLs (80 mg total) into the skin every 12 (twelve) hours.  Notes to Patient:  Use ONLY if you run  out of the other ones before your INR is therapeutic     glyBURIDE 2.5 MG tablet  Commonly known as:  DIABETA  Take 2.5-5 mg by mouth 2 (two) times daily. 5mg  in the morning, 2.5mg  in the evening     hydrochlorothiazide 12.5 MG capsule  Commonly known as:  MICROZIDE  TAKE ONE CAPSULE BY MOUTH EVERY DAY     metFORMIN 500 MG tablet  Commonly known as:  GLUCOPHAGE  Take 500 mg by mouth daily.     metoprolol succinate 50 MG 24 hr tablet  Commonly known as:  TOPROL-XL  TAKE 1 TABLET BY MOUTH EVERY DAY     trandolapril 4 MG tablet  Commonly known as:  MAVIK  TAKE 1 TABLET BY MOUTH EVERY DAY     VIAGRA 100 MG tablet  Generic drug:  sildenafil  Take 100 mg by mouth as needed for erectile dysfunction.     warfarin 7.5 MG tablet  Commonly known as:  COUMADIN  1 tablet daily except 1.5 tablets on Saturdays OR AS DIRECTED BY [place]  ZETIA 10 MG tablet  Generic drug:  ezetimibe  Take 10 mg by mouth daily.        Discharge Instructions    Diet - low sodium heart healthy    Complete by:  As directed      Diet Carb Modified    Complete by:  As directed      Increase activity slowly    Complete by:  As directed           Follow-up Information    Follow up with Lorretta Harp, MD.   Specialty:  Cardiology   Why:  The office will call.   Contact information:   [address] [place] [address] [phone]       BRING ALL MEDICATIONS WITH YOU TO FOLLOW UP APPOINTMENTS  Time spent with patient to include physician time: 38 min Signed: Rosaria Ferries, PA-C 02/22/2015, 11:45 AM Co-Sign MD

## 2015-02-22 NOTE — Discharge Instructions (Signed)
PLEASE REMEMBER TO BRING ALL OF YOUR MEDICATIONS TO EACH OF YOUR FOLLOW-UP OFFICE VISITS.  PLEASE ATTEND ALL SCHEDULED FOLLOW-UP APPOINTMENTS.   Activity: Increase activity slowly as tolerated. You may shower, but no soaking baths (or swimming) for 1 week. No driving for 2 days. No lifting over 5 lbs for 1 week. No sexual activity for 1 week.   You May Return to Work: in 1 week (if applicable)  Wound Care: You may wash cath site gently with soap and water. Keep cath site clean and dry. If you notice pain, swelling, bleeding or pus at your cath site, please call [phone].    Cath Site Care Refer to this sheet in the next few weeks. These instructions provide you with information on caring for yourself after your procedure. Your caregiver may also give you more specific instructions. Your treatment has been planned according to current medical practices, but problems sometimes occur. Call your caregiver if you have any problems or questions after your procedure. HOME CARE INSTRUCTIONS  You may shower 24 hours after the procedure. Remove the bandage (dressing) and gently wash the site with plain soap and water. Gently pat the site dry.   Do not apply powder or lotion to the site.   Do not sit in a bathtub, swimming pool, or whirlpool for 5 to 7 days.   No bending, squatting, or lifting anything over 10 pounds (4.5 kg) as directed by your caregiver.   Inspect the site at least twice daily.   Do not drive home if you are discharged the same day of the procedure. Have someone else drive you.   You may drive 24 hours after the procedure unless otherwise instructed by your caregiver.  What to expect:  Any bruising will usually fade within 1 to 2 weeks.   Blood that collects in the tissue (hematoma) may be painful to the touch. It should usually decrease in size and tenderness within 1 to 2 weeks.  SEEK IMMEDIATE MEDICAL CARE IF:  You have unusual pain at the site or down the affected  limb.   You have redness, warmth, swelling, or pain at the site.   You have drainage (other than a small amount of blood on the dressing).   You have chills.   You have a fever or persistent symptoms for more than 72 hours.   You have a fever and your symptoms suddenly get worse.   Your leg becomes pale, cool, tingly, or numb.   You have heavy bleeding from the site. Hold pressure on the site.  Document Released: 12/26/2010 Document Revised: 11/12/2011 Document Reviewed: 12/26/2010 [facility] Patient Information 2012 [place].

## 2015-02-25 ENCOUNTER — Encounter: Payer: Self-pay | Admitting: *Deleted

## 2015-02-25 ENCOUNTER — Other Ambulatory Visit: Payer: Self-pay | Admitting: Physician Assistant

## 2015-02-25 DIAGNOSIS — I739 Peripheral vascular disease, unspecified: Secondary | ICD-10-CM

## 2015-02-26 ENCOUNTER — Ambulatory Visit (INDEPENDENT_AMBULATORY_CARE_PROVIDER_SITE_OTHER): Payer: Medicare Other

## 2015-02-26 DIAGNOSIS — G459 Transient cerebral ischemic attack, unspecified: Secondary | ICD-10-CM | POA: Diagnosis not present

## 2015-02-26 DIAGNOSIS — Z5181 Encounter for therapeutic drug level monitoring: Secondary | ICD-10-CM

## 2015-02-26 DIAGNOSIS — I359 Nonrheumatic aortic valve disorder, unspecified: Secondary | ICD-10-CM | POA: Diagnosis not present

## 2015-02-26 LAB — POCT INR: INR: 2.6

## 2015-02-27 DIAGNOSIS — E1151 Type 2 diabetes mellitus with diabetic peripheral angiopathy without gangrene: Secondary | ICD-10-CM | POA: Diagnosis not present

## 2015-03-04 ENCOUNTER — Other Ambulatory Visit: Payer: Self-pay | Admitting: Cardiovascular Disease

## 2015-03-06 ENCOUNTER — Ambulatory Visit (HOSPITAL_COMMUNITY)
Admission: RE | Admit: 2015-03-06 | Discharge: 2015-03-06 | Disposition: A | Discharge status: Home / Self Care | Payer: Medicare Other | Source: Ambulatory Visit | Attending: Cardiovascular Disease | Admitting: Cardiovascular Disease

## 2015-03-06 DIAGNOSIS — I739 Peripheral vascular disease, unspecified: Secondary | ICD-10-CM

## 2015-03-06 DIAGNOSIS — I10 Essential (primary) hypertension: Secondary | ICD-10-CM | POA: Diagnosis not present

## 2015-03-06 DIAGNOSIS — Z9889 Other specified postprocedural states: Secondary | ICD-10-CM | POA: Insufficient documentation

## 2015-03-06 DIAGNOSIS — I701 Atherosclerosis of renal artery: Secondary | ICD-10-CM | POA: Diagnosis not present

## 2015-03-06 NOTE — Progress Notes (Signed)
Left renal artery duplex completed.  [place]

## 2015-03-09 ENCOUNTER — Other Ambulatory Visit: Payer: Self-pay | Admitting: Cardiovascular Disease

## 2015-03-11 ENCOUNTER — Encounter: Payer: Self-pay | Admitting: *Deleted

## 2015-03-20 ENCOUNTER — Encounter: Payer: Self-pay | Admitting: Cardiology

## 2015-03-20 ENCOUNTER — Encounter: Payer: Self-pay | Admitting: Cardiovascular Disease

## 2015-03-20 ENCOUNTER — Ambulatory Visit (INDEPENDENT_AMBULATORY_CARE_PROVIDER_SITE_OTHER): Payer: Medicare Other | Admitting: Cardiovascular Disease

## 2015-03-20 VITALS — BP 132/55 | HR 72 | Ht 68.0 in | Wt 176.7 lb

## 2015-03-20 DIAGNOSIS — Z79899 Other long term (current) drug therapy: Secondary | ICD-10-CM | POA: Diagnosis not present

## 2015-03-20 DIAGNOSIS — Z7901 Long term (current) use of anticoagulants: Secondary | ICD-10-CM

## 2015-03-20 DIAGNOSIS — I701 Atherosclerosis of renal artery: Secondary | ICD-10-CM

## 2015-03-20 DIAGNOSIS — I739 Peripheral vascular disease, unspecified: Secondary | ICD-10-CM

## 2015-03-20 NOTE — Progress Notes (Signed)
Cardiology Office Note   Date:  03/20/2015   ID:  [name], [name] 17-Dec-1945, MRN [id number]  PCP:  [name] Reel, MD  Cardiologist:    Dr. Johnsie Cancel PV:  Dr. Gwenlyn Found  Chief Complaint  Patient presents with  . post hospital    patient reports no problems since leaving the hospital.      History of Present Illness: [name] [name] is a 69 y.o. male who presents for post hospitalization visit for renal artery stent.    He was referred by Dr. Johnsie Cancel for peripheral vascular evaluation because of resistant hypertension with duplex evidence of bilateral renal artery stenosis as well as new-onset right calf claudication with recent Dopplers that showed a lesion in his mid right SFA. He does have a history of remote tobacco abuse as well as treated hypertension, hyperlipidemia and diabetes. He has had coronary bypass grafting and St. Jude aVR performed by Dr. Cyndia Bent December 2004 on Coumadin anticoagulation. He has also had aortobifemoral bypass grafting, left external iliac stenting and left common femoral endarterectomy performed by Dr. Donnetta Hutching.  He was seen by Dr. Adora Fridge and set up for PV angiogram.    1: Abdominal aortogram-widely patent right renal artery, 80% left renal artery stenosis. Intact aortobifemoral bypass graft.  2: Left lower extremity-widely patent left common femoral endarterectomy site, normal SFA with 3 vessel runoff  3: Right lower extremity-90% focal calcified mid right SFA stenosis with three-vessel runoff  IMPRESSION:[name] has a widely patent right renal artery and an 80% left renal artery stenosis. Proceeded with PTA and stenting of his left renal artery for treatment of presumed renal vascular hypertension -- successful left renal artery stenting using a balloon-expandable stent for treatment of renal vascular hypertension. The patient does have a focal calcific lesion in the mid right SFA which may be responsible for his high-frequency signal and  a calf claudication.   He was discharged with lovenox/coumadin crossover.  Now with INR 2.6 his lovenox has been stopped.    Recent follow up renal Dopplers with left renal artery with greater than 60% diameter reduction. On the angiogram it was very open left renal artery at the end of the procedure.  His blood pressure is better controlled more so the diastolic today's blood pressure stable 132/55. His complaint today is right lower extremity claudication he can only walk a quarter of a mile before he starts having cramping pain has to stop and rest.  He would like to proceed as soon as possible with stent to the right SFA.  Past Medical History  Diagnosis Date  . Diabetes mellitus   . Hypertension   . Hyperlipidemia   . ITP (idiopathic thrombocytopenic purpura)   . Coronary artery disease     s/p cabg x 3 11/2003: lima-lad, seq vg to rpda and rpl  . Aortic stenosis     s/p st. jude mechanical AVR - Chronic Coumadin  . Claudication   . Diverticulitis of colon   . Hx of colonic polyps   . Peripheral vascular disease     s/p Left external Iliac Artery stenting and subsequent left femoral endarterectomy 02/2011- post op course complicated by wound infxn req I&D 03/2011  . Bronchitis 12/17/11    "just gettin over a case"  . Anemia   . Blood transfusion   . Peripheral arterial disease     history of aortobifemoral bypass grafting by Dr. Sherren Mocha early  . Renal artery stenosis, native, bilateral  bilateral renal artery stenosis by recent duplex ultrasound    Past Surgical History  Procedure Laterality Date  . Splenectomy  ~ 2003  . Coronary artery bypass graft    . Aortic valve replacement  ~ 2004  . Angioplasty / stenting iliac      Left external Iliac Artery  . Coronary angioplasty with stent placement  12/16/11  . Tonsillectomy  ~ 1952  . Cardiac valve replacement      aortic  . Aorta - bilateral femoral artery bypass graft  01/18/2012    Procedure: AORTA BIFEMORAL BYPASS GRAFT;   Surgeon: Curt Jews, MD;  Location: [facility];  Service: Vascular;  Laterality: N/A;  . Abdominal aortagram N/A 12/16/2011    Procedure: ABDOMINAL Maxcine Ham;  Surgeon: Sherren Mocha, MD;  Location: [facility] CATH LAB;  Service: Cardiovascular;  Laterality: N/A;  . Lower extremity angiogram N/A 02/21/2015    Procedure: LOWER EXTREMITY ANGIOGRAM;  Surgeon: Lorretta Harp, MD;  Location: [facility] CATH LAB;  Service: Cardiovascular;  Laterality: N/A;  . Renal angiogram N/A 02/21/2015    Procedure: RENAL ANGIOGRAM;  Surgeon: Lorretta Harp, MD;  Location: [facility] CATH LAB;  Service: Cardiovascular;  Laterality: Bilateral; 6 mm x 12 mm long Herculink balloon expandable stent to the left renal artery     Current Outpatient Prescriptions  Medication Sig Dispense Refill  . amLODipine (NORVASC) 10 MG tablet Take 10 mg by mouth daily.  5  . atorvastatin (LIPITOR) 40 MG tablet Take 40 mg by mouth daily.     . clopidogrel (PLAVIX) 75 MG tablet Take 1 tablet (75 mg total) by mouth daily. 90 tablet 1  . glyBURIDE (DIABETA) 2.5 MG tablet Take 2.5-5 mg by mouth 2 (two) times daily. 40m in the morning, 2.572min the evening    . hydrochlorothiazide (MICROZIDE) 12.5 MG capsule TAKE ONE CAPSULE BY MOUTH EVERY DAY 30 capsule 5  . INVOKANA 100 MG TABS tablet Take 100 mg by mouth daily.  6  . metFORMIN (GLUCOPHAGE) 500 MG tablet Take 500 mg by mouth daily.     . metoprolol succinate (TOPROL-XL) 50 MG 24 hr tablet TAKE 1 TABLET BY MOUTH EVERY DAY 30 tablet 5  . trandolapril (MAVIK) 4 MG tablet TAKE 1 TABLET BY MOUTH EVERY DAY 30 tablet 5  . VIAGRA 100 MG tablet Take 100 mg by mouth as needed for erectile dysfunction.   5  . warfarin (COUMADIN) 7.5 MG tablet TAKE 1 TABLET DAILY, EXCEPT TAKE 1 AND 1/2 TABLETS ON SATURDAYS OR AS DIRECTED BY COUMADIN CLINIC 40 tablet 3  . ZETIA 10 MG tablet Take 10 mg by mouth daily.  11   No current facility-administered medications for this visit.    Allergies:   Review of patient's allergies indicates  no known allergies.    Social History:  The patient  reports that he quit smoking about 21 years ago. His smoking use included Cigarettes. He has a 45 pack-year smoking history. He has never used smokeless tobacco. He reports that he drinks about 7.0 oz of alcohol per week. He reports that he does not use illicit drugs.   Family History:  The patient's family history includes Breast cancer in his sister; Coronary artery disease in his mother; Diabetes in an other family member; Heart disease in his father. There is no history of Colon cancer.    ROS:  General:no colds or fevers, some weight decrease Skin:no rashes or ulcers HEENT:no blurred vision, no congestion CV:see HPI  No chest pain  no SOB PUL:see HPI GI:no diarrhea constipation or melena, no indigestion GU:no hematuria, no dysuria MS:no joint pain, + claudication Neuro:no syncope, no lightheadedness Endo:+ diabetes, no thyroid disease  Wt Readings from Last 3 Encounters:  03/20/15 176 lb 11.2 oz (80.151 kg)  02/08/15 180 lb (81.647 kg)  01/31/15 181 lb (82.101 kg)     PHYSICAL EXAM: VS:  BP 132/55 mmHg  Pulse 72  Ht 5' 8"  (1.727 m)  Wt 176 lb 11.2 oz (80.151 kg)  BMI 26.87 kg/m2 , BMI Body mass index is 26.87 kg/(m^2). General:Pleasant affect, NAD Skin:Warm and dry, brisk capillary refill HEENT:normocephalic, sclera clear, mucus membranes moist Neck:supple, no JVD, no bruits  Heart:S1S2 RRR without murmur+ crisp closure of valve, no gallup, rub or click Lungs:clear without rales, rhonchi, or wheezes RSW:NIOE, non tender, + BS, do not palpate liver spleen or masses Ext:no lower ext edema,  2+ radial pulses Neuro:alert and oriented X3, MAE, follows commands, + facial symmetry    EKG:  EKG is not ordered today.    Recent Labs: 02/18/2015: TSH 1.059 02/22/2015: BUN 8; Creatinine 0.81; Hemoglobin 13.3; Platelets 204; Potassium 4.0; Sodium 137    Lipid Panel    Component Value Date/Time   CHOL 154 05/06/2013 0425     TRIG 121 05/06/2013 0425   HDL 68 05/06/2013 0425   CHOLHDL 2.3 05/06/2013 0425   VLDL 24 05/06/2013 0425   LDLCALC 62 05/06/2013 0425       Other studies Reviewed: Additional studies/ records that were reviewed today include: reviewed PV studies with Dr. Adora Fridge..   ASSESSMENT AND PLAN:  1.  Renal artery stenosis with renovascular hypertension, Doppler suggested bilateral renal artery stenosis. PV angiogram revealed patent right renal artery and obstructed left renal artery undergoing intervention--Stenting was performed with a 6 mm x 12 mm long Herculink balloon expandable stent up to 10 atm resulting in reduction of an 80% stenosis to 0% residual with excellent flow. Doppler follow-up with greater than 60% diameter reduction of left renal artery.  Per patient his blood pressures been improved control at home. Follow-up Dopplers in 6 months  2. Peripheral vascular disease Status post aortobifemoral bypass grafting along with left external iliac artery stenting and femoral endarterectomy 02/2011. He has recurrent right calf claudication with recent Dopplers performed 01/25/15 revealing a high-grade mid right SFA stenosis. On PV angiogram done in March of this year 90% focal calcified mid right SFA stenosis with three-vessel runoff.  Plan will be to do PCI on the right SFA. Dr. Gwenlyn Found discussed this at length with him. We would need Scott from  [employer] to be present for the intervention.  3. Lifestyle limiting claudication claudication occurs after walking one quarter of a mile.  4. Chronic anticoagulation secondary to mechanical St. Jude aortic valve replacement.  Currently therapeutic INR.  Will need Lovenox Coumadin crossover for his SFA procedure.  5. St. Jude mechanical aortic valve   Current medicines are reviewed with the patient today.  The patient Has no concerns regarding medicines.  The following changes have been made:  See above Labs/ tests ordered today include:see  above  Disposition:   FU:  see above  Lennie Muckle, NP  03/20/2015 3:51 PM    [facility] [place], [place], [place], [place] Phone: [phone]; Fax: [phone]  Agree with note written by Cecilie Kicks RNP  Pt had a good result from Left  Renal Artery stenting. He has residual RSFA focal mid calcified lesion with abn duplex and RLE claud. Plan [name] 1 atherectomy via antegrade approach with diatal protection and lovenox bridging.  Lorretta Harp 03/21/2015 7:36 AM

## 2015-03-20 NOTE — Patient Instructions (Signed)
Your physician has requested that you have a peripheral vascular angiogram mid May by Dr. Gwenlyn Found.  Scott with [employer] will be notified to be in the cath lab. This exam is performed at the hospital. During this exam IV contrast is used to look at arterial blood flow. Please review the information sheet given for details.  You will need to see Erasmo Downer for a Lovenox bridge prior to this procedure.  Your physician recommends that you return for lab work in: 5-7 days prior to the [facility] cath at [facility] lab.

## 2015-03-21 NOTE — Progress Notes (Signed)
   Patient ID: [name] [name], male    DOB: 1946-06-12, 69 y.o.   MRN: [id number]  HPI    Review of Systems    Physical Exam

## 2015-03-22 ENCOUNTER — Other Ambulatory Visit: Payer: Self-pay

## 2015-03-22 DIAGNOSIS — I739 Peripheral vascular disease, unspecified: Secondary | ICD-10-CM

## 2015-03-27 ENCOUNTER — Ambulatory Visit (INDEPENDENT_AMBULATORY_CARE_PROVIDER_SITE_OTHER): Payer: Medicare Other | Admitting: *Deleted

## 2015-03-27 DIAGNOSIS — Z5181 Encounter for therapeutic drug level monitoring: Secondary | ICD-10-CM | POA: Diagnosis not present

## 2015-03-27 DIAGNOSIS — G459 Transient cerebral ischemic attack, unspecified: Secondary | ICD-10-CM | POA: Diagnosis not present

## 2015-03-27 DIAGNOSIS — I359 Nonrheumatic aortic valve disorder, unspecified: Secondary | ICD-10-CM

## 2015-03-27 LAB — POCT INR: INR: 2.6

## 2015-03-27 MED ORDER — ENOXAPARIN SODIUM 80 MG/0.8ML ~~LOC~~ SOLN: 80.0000 mg | Freq: Two times a day (BID) | SUBCUTANEOUS | Status: DC

## 2015-03-27 NOTE — Patient Instructions (Addendum)
04/16/15- Take last Coumadin dosage  04/16/05- Do Nothing  04/18/15-Start Lovenox '80mg'$  injection at 9am and 9pm subcutaneously into fatty abdominal tissue 2 inches away from navel, rotate sites  04/19/15- Continue Lovenox '80mg'$  injection at 9am and 9pm subcutaneously into fatty abdominal tissue 2 inches away from navel, rotate sites  04/20/15-  Continue Lovenox '80mg'$  injection at 9am and 9pm subcutaneously into fatty abdominal tissue 2 inches away from navel, rotate sites  04/21/15- Take only one Lovenox '80mg'$  injection at 9am   04/22/15- Day of Procedure, Restart Coumadin after procedure when ok with Dr Gwenlyn Found, When restart take an extra 1/2 tablet of Coumadin  along normal for 2 days then continue regular dose until follow coumadin appt.   04/23/15- Restart  Lovenox '80mg'$  injection at 9am and 9pm subcutaneously into fatty abdominal tissue 2 inches away from navel, rotate sites. Continue both Coumadin and Lovenox until follow Coumadin appt. On 04/29/15

## 2015-04-17 DIAGNOSIS — Z7901 Long term (current) use of anticoagulants: Secondary | ICD-10-CM | POA: Diagnosis not present

## 2015-04-17 DIAGNOSIS — Z79899 Other long term (current) drug therapy: Secondary | ICD-10-CM | POA: Diagnosis not present

## 2015-04-18 LAB — CBC
HEMATOCRIT: 48.3 % (ref 39.0–52.0)
HEMOGLOBIN: 16 g/dL (ref 13.0–17.0)
MCH: 31.6 pg (ref 26.0–34.0)
MCHC: 33.1 g/dL (ref 30.0–36.0)
MCV: 95.5 fL (ref 78.0–100.0)
MPV: 11.1 fL (ref 8.6–12.4)
PLATELETS: 235 10*3/uL (ref 150–400)
RBC: 5.06 MIL/uL (ref 4.22–5.81)
RDW: 14.3 % (ref 11.5–15.5)
WBC: 7 10*3/uL (ref 4.0–10.5)

## 2015-04-18 LAB — APTT: APTT: 41 s — AB (ref 24–37)

## 2015-04-18 LAB — PROTIME-INR
INR: 2.46 — ABNORMAL HIGH (ref <1.50)
Prothrombin Time: 26.7 seconds — ABNORMAL HIGH (ref 11.6–15.2)

## 2015-04-18 LAB — COMPREHENSIVE METABOLIC PANEL
ALBUMIN: 4.2 g/dL (ref 3.5–5.2)
ALT: 27 U/L (ref 0–53)
AST: 25 U/L (ref 0–37)
Alkaline Phosphatase: 59 U/L (ref 39–117)
BUN: 19 mg/dL (ref 6–23)
CALCIUM: 9.8 mg/dL (ref 8.4–10.5)
CHLORIDE: 101 meq/L (ref 96–112)
CO2: 27 mEq/L (ref 19–32)
Creat: 0.86 mg/dL (ref 0.50–1.35)
Glucose, Bld: 177 mg/dL — ABNORMAL HIGH (ref 70–99)
POTASSIUM: 4.7 meq/L (ref 3.5–5.3)
SODIUM: 139 meq/L (ref 135–145)
Total Bilirubin: 0.9 mg/dL (ref 0.2–1.2)
Total Protein: 7.1 g/dL (ref 6.0–8.3)

## 2015-04-22 ENCOUNTER — Encounter (HOSPITAL_COMMUNITY): Payer: Self-pay | Admitting: General Practice

## 2015-04-22 ENCOUNTER — Encounter (HOSPITAL_COMMUNITY)
Admission: RE | Disposition: A | Discharge status: Home / Self Care | Payer: Medicare Other | Source: Ambulatory Visit | Attending: Cardiovascular Disease

## 2015-04-22 ENCOUNTER — Ambulatory Visit (HOSPITAL_COMMUNITY): Admission: RE | Admit: 2015-04-22 | Payer: Medicare Other | Source: Ambulatory Visit | Admitting: Cardiovascular Disease

## 2015-04-22 ENCOUNTER — Ambulatory Visit (HOSPITAL_COMMUNITY)
Admission: RE | Admit: 2015-04-22 | Discharge: 2015-04-23 | Disposition: A | Discharge status: Home / Self Care | Payer: Medicare Other | Source: Ambulatory Visit | Attending: Cardiovascular Disease | Admitting: Cardiovascular Disease

## 2015-04-22 ENCOUNTER — Encounter (HOSPITAL_COMMUNITY): Admission: RE | Payer: Self-pay | Source: Ambulatory Visit

## 2015-04-22 DIAGNOSIS — I70211 Atherosclerosis of native arteries of extremities with intermittent claudication, right leg: Secondary | ICD-10-CM | POA: Insufficient documentation

## 2015-04-22 DIAGNOSIS — E785 Hyperlipidemia, unspecified: Secondary | ICD-10-CM | POA: Insufficient documentation

## 2015-04-22 DIAGNOSIS — Z794 Long term (current) use of insulin: Secondary | ICD-10-CM | POA: Insufficient documentation

## 2015-04-22 DIAGNOSIS — E119 Type 2 diabetes mellitus without complications: Secondary | ICD-10-CM | POA: Diagnosis not present

## 2015-04-22 DIAGNOSIS — I739 Peripheral vascular disease, unspecified: Secondary | ICD-10-CM | POA: Diagnosis not present

## 2015-04-22 DIAGNOSIS — Z951 Presence of aortocoronary bypass graft: Secondary | ICD-10-CM | POA: Insufficient documentation

## 2015-04-22 DIAGNOSIS — Z87891 Personal history of nicotine dependence: Secondary | ICD-10-CM | POA: Diagnosis not present

## 2015-04-22 DIAGNOSIS — Z7902 Long term (current) use of antithrombotics/antiplatelets: Secondary | ICD-10-CM | POA: Insufficient documentation

## 2015-04-22 DIAGNOSIS — Z952 Presence of prosthetic heart valve: Secondary | ICD-10-CM

## 2015-04-22 DIAGNOSIS — Z7901 Long term (current) use of anticoagulants: Secondary | ICD-10-CM | POA: Diagnosis not present

## 2015-04-22 DIAGNOSIS — E7800 Pure hypercholesterolemia, unspecified: Secondary | ICD-10-CM | POA: Diagnosis present

## 2015-04-22 DIAGNOSIS — I701 Atherosclerosis of renal artery: Secondary | ICD-10-CM | POA: Diagnosis not present

## 2015-04-22 DIAGNOSIS — Z7982 Long term (current) use of aspirin: Secondary | ICD-10-CM | POA: Insufficient documentation

## 2015-04-22 DIAGNOSIS — I2510 Atherosclerotic heart disease of native coronary artery without angina pectoris: Secondary | ICD-10-CM | POA: Diagnosis not present

## 2015-04-22 DIAGNOSIS — I10 Essential (primary) hypertension: Secondary | ICD-10-CM | POA: Diagnosis present

## 2015-04-22 HISTORY — PX: RENAL ARTERY STENT: SHX2321

## 2015-04-22 HISTORY — PX: PERIPHERAL VASCULAR CATHETERIZATION: SHX172C

## 2015-04-22 LAB — GLUCOSE, CAPILLARY
GLUCOSE-CAPILLARY: 171 mg/dL — AB (ref 65–99)
Glucose-Capillary: 154 mg/dL — ABNORMAL HIGH (ref 65–99)
Glucose-Capillary: 229 mg/dL — ABNORMAL HIGH (ref 65–99)
Glucose-Capillary: 136 mg/dL — ABNORMAL HIGH (ref 65–99)

## 2015-04-22 LAB — POCT ACTIVATED CLOTTING TIME
ACTIVATED CLOTTING TIME: 196 s
ACTIVATED CLOTTING TIME: 208 s
ACTIVATED CLOTTING TIME: 214 s
Activated Clotting Time: 220 seconds
Activated Clotting Time: 153 seconds

## 2015-04-22 LAB — PROTIME-INR
INR: 1 (ref 0.00–1.49)
PROTHROMBIN TIME: 13.3 s (ref 11.6–15.2)

## 2015-04-22 SURGERY — ANGIOGRAM, LOWER EXTREMITY
Anesthesia: LOCAL

## 2015-04-22 SURGERY — LOWER EXTREMITY ANGIOGRAPHY

## 2015-04-22 MED ORDER — EZETIMIBE 10 MG PO TABS
10.0000 mg | ORAL_TABLET | Freq: Every day | ORAL | Status: DC
  Administered 2015-04-23: 10 mg via ORAL
  Filled 2015-04-22: qty 1

## 2015-04-22 MED ORDER — GLYBURIDE 2.5 MG PO TABS
2.5000 mg | ORAL_TABLET | Freq: Every day | ORAL | Status: DC
  Administered 2015-04-22: 18:00:00 2.5 mg via ORAL
  Filled 2015-04-22 (×3): qty 1

## 2015-04-22 MED ORDER — INSULIN ASPART 100 UNIT/ML ~~LOC~~ SOLN
0.0000 [IU] | Freq: Three times a day (TID) | SUBCUTANEOUS | Status: DC
  Administered 2015-04-22: 3 [IU] via SUBCUTANEOUS
  Administered 2015-04-22 – 2015-04-23 (×2): 2 [IU] via SUBCUTANEOUS

## 2015-04-22 MED ORDER — ASPIRIN 81 MG PO CHEW
CHEWABLE_TABLET | ORAL | Status: AC
  Filled 2015-04-22: qty 1

## 2015-04-22 MED ORDER — SODIUM CHLORIDE 0.9 % IV SOLN
INTRAVENOUS | Status: AC
  Administered 2015-04-22: 13:00:00 via INTRAVENOUS

## 2015-04-22 MED ORDER — ASPIRIN 81 MG PO CHEW
81.0000 mg | CHEWABLE_TABLET | ORAL | Status: AC
  Administered 2015-04-22: 81 mg via ORAL

## 2015-04-22 MED ORDER — GLYBURIDE 5 MG PO TABS
5.0000 mg | ORAL_TABLET | Freq: Every day | ORAL | Status: DC
  Administered 2015-04-23: 09:00:00 2.5 mg via ORAL
  Filled 2015-04-22 (×2): qty 1

## 2015-04-22 MED ORDER — HYDRALAZINE HCL 20 MG/ML IJ SOLN
10.0000 mg | INTRAMUSCULAR | Status: DC | PRN
  Administered 2015-04-22 (×2): 10 mg via INTRAVENOUS
  Filled 2015-04-22 (×2): qty 1

## 2015-04-22 MED ORDER — METOPROLOL SUCCINATE ER 50 MG PO TB24
50.0000 mg | ORAL_TABLET | Freq: Every day | ORAL | Status: DC
  Administered 2015-04-22 – 2015-04-23 (×2): 50 mg via ORAL
  Filled 2015-04-22 (×3): qty 1

## 2015-04-22 MED ORDER — FENTANYL CITRATE (PF) 100 MCG/2ML IJ SOLN
INTRAMUSCULAR | Status: AC
  Filled 2015-04-22: qty 2

## 2015-04-22 MED ORDER — CLOPIDOGREL BISULFATE 75 MG PO TABS
75.0000 mg | ORAL_TABLET | Freq: Every day | ORAL | Status: DC
  Administered 2015-04-23: 09:00:00 75 mg via ORAL
  Filled 2015-04-22: qty 1

## 2015-04-22 MED ORDER — CLOPIDOGREL BISULFATE 75 MG PO TABS: 75.0000 mg | ORAL_TABLET | Freq: Every day | ORAL | Status: DC

## 2015-04-22 MED ORDER — HEPARIN SODIUM (PORCINE) 1000 UNIT/ML IJ SOLN
INTRAMUSCULAR | Status: DC | PRN
  Administered 2015-04-22: 5000 [IU] via INTRAVENOUS
  Administered 2015-04-22 (×2): 3000 [IU] via INTRAVENOUS

## 2015-04-22 MED ORDER — LIDOCAINE HCL (PF) 1 % IJ SOLN
INTRAMUSCULAR | Status: AC
  Filled 2015-04-22: qty 30

## 2015-04-22 MED ORDER — FENTANYL CITRATE (PF) 100 MCG/2ML IJ SOLN
INTRAMUSCULAR | Status: DC | PRN
  Administered 2015-04-22 (×3): 25 ug via INTRAVENOUS

## 2015-04-22 MED ORDER — ATORVASTATIN CALCIUM 40 MG PO TABS
40.0000 mg | ORAL_TABLET | Freq: Every day | ORAL | Status: DC
  Administered 2015-04-23: 10:00:00 40 mg via ORAL
  Filled 2015-04-22: qty 1

## 2015-04-22 MED ORDER — HEPARIN SODIUM (PORCINE) 1000 UNIT/ML IJ SOLN
INTRAMUSCULAR | Status: AC
  Filled 2015-04-22: qty 1

## 2015-04-22 MED ORDER — AMLODIPINE BESYLATE 10 MG PO TABS
10.0000 mg | ORAL_TABLET | Freq: Every day | ORAL | Status: DC
  Administered 2015-04-23: 10:00:00 10 mg via ORAL
  Filled 2015-04-22: qty 1

## 2015-04-22 MED ORDER — SODIUM CHLORIDE 0.9 % IJ SOLN: 3.0000 mL | INTRAMUSCULAR | Status: DC | PRN

## 2015-04-22 MED ORDER — SODIUM CHLORIDE 0.9 % IV SOLN
INTRAVENOUS | Status: DC
  Administered 2015-04-22: 08:00:00 via INTRAVENOUS

## 2015-04-22 MED ORDER — ASPIRIN EC 325 MG PO TBEC
325.0000 mg | DELAYED_RELEASE_TABLET | Freq: Every day | ORAL | Status: DC
  Administered 2015-04-23: 325 mg via ORAL
  Filled 2015-04-22: qty 1

## 2015-04-22 MED ORDER — GLYBURIDE 2.5 MG PO TABS: 2.5000 mg | ORAL_TABLET | Freq: Two times a day (BID) | ORAL | Status: DC

## 2015-04-22 MED ORDER — ONDANSETRON HCL 4 MG/2ML IJ SOLN: 4.0000 mg | Freq: Four times a day (QID) | INTRAMUSCULAR | Status: DC | PRN

## 2015-04-22 MED ORDER — MIDAZOLAM HCL 2 MG/2ML IJ SOLN
INTRAMUSCULAR | Status: AC
  Filled 2015-04-22: qty 2

## 2015-04-22 MED ORDER — IODIXANOL 320 MG/ML IV SOLN
INTRAVENOUS | Status: DC | PRN
  Administered 2015-04-22: 87 mL via INTRA_ARTERIAL

## 2015-04-22 MED ORDER — ACETAMINOPHEN 325 MG PO TABS: 650.0000 mg | ORAL_TABLET | ORAL | Status: DC | PRN

## 2015-04-22 MED ORDER — MIDAZOLAM HCL 2 MG/2ML IJ SOLN
INTRAMUSCULAR | Status: DC | PRN
  Administered 2015-04-22 (×3): 1 mg via INTRAVENOUS

## 2015-04-22 MED ORDER — CANAGLIFLOZIN 100 MG PO TABS
100.0000 mg | ORAL_TABLET | Freq: Every day | ORAL | Status: DC
  Administered 2015-04-22: 15:00:00 100 mg via ORAL
  Filled 2015-04-22 (×2): qty 1

## 2015-04-22 MED ORDER — HEPARIN (PORCINE) IN NACL 2-0.9 UNIT/ML-% IJ SOLN
INTRAMUSCULAR | Status: AC
  Filled 2015-04-22: qty 1000

## 2015-04-22 MED ORDER — MORPHINE SULFATE 2 MG/ML IJ SOLN: 2.0000 mg | INTRAMUSCULAR | Status: DC | PRN

## 2015-04-22 MED ORDER — HYDROCHLOROTHIAZIDE 12.5 MG PO CAPS
12.5000 mg | ORAL_CAPSULE | Freq: Every day | ORAL | Status: DC
  Administered 2015-04-23: 12.5 mg via ORAL
  Filled 2015-04-22: qty 1

## 2015-04-22 SURGICAL SUPPLY — 29 items
BALLN LUTONIX DCB 6X100X130 (BALLOONS) ×2
BALLOON ARMADA 2.5X20X150 (BALLOONS) ×1 IMPLANT
BALLOON LUTONIX DCB 6X100X130 (BALLOONS) IMPLANT
BALLOON SABER 5.0X100X150 (BALLOONS) ×1 IMPLANT
CATH CXI SUPP ST 2.6FR 150CM (MICROCATHETER) ×1 IMPLANT
CATH HAWKONE LS STANDARD TIP (CATHETERS) ×2
CATH HAWKONE LS STD TIP (CATHETERS) IMPLANT
CATH QUICKCROSS SUPP .018X90CM (MICROCATHETER) ×1 IMPLANT
COVER DOME SNAP 22 D (MISCELLANEOUS) ×1 IMPLANT
DEVICE SPIDERFX EMB PROT 7MM (WIRE) ×1 IMPLANT
GUIDEWIRE REGALIA .014X300CM (WIRE) ×1 IMPLANT
HAND CONTROLLER AVANTA (MISCELLANEOUS) ×1 IMPLANT
KIT ENCORE 26 ADVANTAGE (KITS) ×1 IMPLANT
KIT PV (KITS) ×2 IMPLANT
SET AVANTA SINGLE PATIENT (MISCELLANEOUS) ×1 IMPLANT
SET INTRODUCER MICROPUNCT 5F (INTRODUCER) ×2 IMPLANT
SHEATH AVANTA HAND CONTROLLER (MISCELLANEOUS) ×1 IMPLANT
SHEATH FLEXOR ANSEL 1 7F 45CM (SHEATH) ×1 IMPLANT
SHEATH PINNACLE 5F 10CM (SHEATH) ×1 IMPLANT
SHEATH PINNACLE 7F 10CM (SHEATH) ×1 IMPLANT
SHEATH PINNACLE 8F 10CM (SHEATH) ×1 IMPLANT
TAPE RADIOPAQUE TURBO (MISCELLANEOUS) ×1 IMPLANT
TRANSDUCER W/STOPCOCK (MISCELLANEOUS) ×2 IMPLANT
TRAY PV CATH (CUSTOM PROCEDURE TRAY) ×2 IMPLANT
TUBING CIL FLEX 10 FLL-RA (TUBING) ×1 IMPLANT
WIRE HITORQ VERSACORE ST 145CM (WIRE) ×1 IMPLANT
WIRE ROSEN-J .035X180CM (WIRE) ×1 IMPLANT
WIRE SPARTACORE .014X300CM (WIRE) ×1 IMPLANT
WIRE TORQFLEX AUST .018X40CM (WIRE) ×1 IMPLANT

## 2015-04-22 NOTE — Progress Notes (Signed)
Site area: RFA Site Prior to Removal:  Level 0-area below site mildly hard to palpation/confirmed by Dina Rich RN Pressure Applied For:30 min Manual:  By hand  Patient Status During Pull:  stable Post Pull Site:  Level 0-less pronounced palpable area Post Pull Instructions Given: yes  Post Pull Pulses Present: palpable DP-stronger post removal Dressing Applied:  clear Bedrest begins @ 1450 Comments:hypertensive-10 mg hydralazine given during pressure hold.

## 2015-04-22 NOTE — H&P (Signed)
History obtained from chart review.Mr. 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 His primary care physician is Dr. Virgina Jock. He was referred by Dr. Johnsie Cancel for peripheral vascular evaluation because of resistant hypertension with duplex evidence of bilateral renal artery stenosis as well as new-onset right calf claudication with recent Dopplers that showed a lesion in his mid right SFA. He does have a history of remote tobacco abuse as well as treated hypertension, hyperlipidemia and diabetes. He has had coronary bypass grafting and St. Jude aVR performed by Dr. Cyndia Bent December 2004 on Coumadin anticoagulation. He has also had aortobifemoral bypass grafting, left external iliac stenting and left common femoral endarterectomy performed by Dr. Donnetta Hutching. The patient had successful renal artery stenting. He has continued right calf claudication with a high-grade calcific lesion in the mid to distal right SFA. He presents now after holding his Coumadin anticoagulations for attempt at directional atherectomy. We will use distal protection as well.   PROCEDURE DESCRIPTION:   The patient was brought to the second floor [facility] cath lab in the postabsorptive state. He was premedicated with Valium 5 mg by mouth, IV Versed and fentanyl. His right groin was prepped and shaved in usual sterile fashion. Xylocaine 1% was used for local anesthesia. A 5 French sheath was inserted into the right common femoral artery using standard Seldinger technique. A 5 French pigtail catheter was placed at the level of the renal arteries. Abdominal aortography photography, bilateral iliac angiography and bifemoral runoff was performed using bolus trace digital subtraction step  table technique. Visipaque dye was used for the entirety of the case. Retrograde aortic, left ventricular and pullback pressures were recorded.   HEMODYNAMICS:   AO SYSTOLIC/AO DIASTOLIC: 301/60  Angiographic Data:   1: Abdominal aortogram-widely patent right renal artery, 80% left renal artery stenosis. Intact aortobifemoral bypass graft.  2: Left lower extremity-widely patent left common femoral endarterectomy site, normal SFA with 3 vessel runoff  3: Right lower extremity-90% focal calcified mid right SFA stenosis with three-vessel runoff  IMPRESSION:Mr. Davoli has a widely patent right renal artery and an 80% left renal artery stenosis. We will proceed with PTA and stenting of his left renal artery for treatment of presumed renal vascular hypertension.  Procedure Description:the patient received a patches of heparin intravenously with an ending ACT at 208. A total of 140 mL of contrast with the patient. Using a 6 [place] JR4 short guide catheter along with an 014 stabilizer wire and a 4 mm x 15 mm long balloon predilatation was performed after the lesion was crossed with wire. Stenting was performed with a 6 mm x 12 mm long Herculink balloon expandable stent up to 10 atm resulting in reduction of an 80% stenosis to 0% residual with excellent flow. The patient tolerated the procedure well. The guidewire and catheter were removed. The sheath was secured. The patient left the lab in stable condition.  Final Impression: successful left renal artery stenting using a balloon-expandable stent for treatment of renal vascular hypertension. The patient does have a focal calcific lesion in the mid right SFA which may be responsible for his high-frequency signal and a calf claudication. We will discuss treatment options was seen back in the office. The sheath  will be removed and pressure held on the groin to achieve hemostasis. Patient will be hydrated overnight. Lovenox will be started for bridging  to Coumadin anticoagulation because of his St. Jude AVR. Plavix started and aspirin held. He will get renal Dopplers and I notified office next week and he will see mid-level provider back in 2-3 weeks on a daily vitamin the office. The plan is to keep him on Coumadin and Plavix for 3 months after which I will restart low-dose aspirin and discontinue Plavix.    Lorretta Harp, M.D., [place], [facility], Laverta [place] [address] [place], [place]  [address]  [phone] 04/22/2015 9:38 AM

## 2015-04-22 NOTE — Research (Signed)
Safe Registry Informed Consent   Subject Name: [name] [name]  Subject met inclusion and exclusion criteria.  The informed consent form, study requirements and expectations were reviewed with the subject and questions and concerns were addressed prior to the signing of the consent form.  The subject verbalized understanding of the trail requirements.  The subject agreed to participate in the Safe trial and signed the informed consent.  The informed consent was obtained prior to performance of any protocol-specific procedures for the subject.  A copy of the signed informed consent was given to the subject and a copy was placed in the subject's medical record.  Sandie Ano 04/22/2015, 8:45

## 2015-04-23 ENCOUNTER — Encounter (HOSPITAL_COMMUNITY): Payer: Self-pay | Admitting: Physician Assistant

## 2015-04-23 ENCOUNTER — Other Ambulatory Visit: Payer: Self-pay | Admitting: Physician Assistant

## 2015-04-23 ENCOUNTER — Telehealth: Payer: Self-pay | Admitting: Cardiovascular Disease

## 2015-04-23 DIAGNOSIS — I739 Peripheral vascular disease, unspecified: Secondary | ICD-10-CM | POA: Diagnosis not present

## 2015-04-23 DIAGNOSIS — I701 Atherosclerosis of renal artery: Secondary | ICD-10-CM

## 2015-04-23 DIAGNOSIS — E785 Hyperlipidemia, unspecified: Secondary | ICD-10-CM | POA: Diagnosis not present

## 2015-04-23 DIAGNOSIS — E780 Pure hypercholesterolemia: Secondary | ICD-10-CM | POA: Diagnosis not present

## 2015-04-23 DIAGNOSIS — I70211 Atherosclerosis of native arteries of extremities with intermittent claudication, right leg: Secondary | ICD-10-CM | POA: Diagnosis not present

## 2015-04-23 DIAGNOSIS — I10 Essential (primary) hypertension: Secondary | ICD-10-CM | POA: Diagnosis not present

## 2015-04-23 DIAGNOSIS — Z954 Presence of other heart-valve replacement: Secondary | ICD-10-CM | POA: Diagnosis not present

## 2015-04-23 DIAGNOSIS — E119 Type 2 diabetes mellitus without complications: Secondary | ICD-10-CM | POA: Diagnosis not present

## 2015-04-23 LAB — BASIC METABOLIC PANEL
ANION GAP: 8 (ref 5–15)
BUN: 12 mg/dL (ref 6–20)
CO2: 26 mmol/L (ref 22–32)
CREATININE: 0.8 mg/dL (ref 0.61–1.24)
Calcium: 8.9 mg/dL (ref 8.9–10.3)
Chloride: 103 mmol/L (ref 101–111)
GFR calc non Af Amer: >60 mL/min (ref >60)
Glucose, Bld: 91 mg/dL (ref 65–99)
Potassium: 3.7 mmol/L (ref 3.5–5.1)
Sodium: 137 mmol/L (ref 135–145)

## 2015-04-23 LAB — CBC
HCT: 43.9 % (ref 39.0–52.0)
HEMOGLOBIN: 14.9 g/dL (ref 13.0–17.0)
MCH: 31.8 pg (ref 26.0–34.0)
MCHC: 33.9 g/dL (ref 30.0–36.0)
MCV: 93.6 fL (ref 78.0–100.0)
Platelets: 193 10*3/uL (ref 150–400)
RBC: 4.69 MIL/uL (ref 4.22–5.81)
RDW: 14.8 % (ref 11.5–15.5)
WBC: 8.3 10*3/uL (ref 4.0–10.5)

## 2015-04-23 LAB — GLUCOSE, CAPILLARY
Glucose-Capillary: 116 mg/dL — ABNORMAL HIGH (ref 65–99)
Glucose-Capillary: 174 mg/dL — ABNORMAL HIGH (ref 65–99)

## 2015-04-23 MED ORDER — CLOPIDOGREL BISULFATE 75 MG PO TABS: 75.0000 mg | ORAL_TABLET | Freq: Every day | ORAL | Status: DC

## 2015-04-23 MED ORDER — ASPIRIN 325 MG PO TBEC: 325.0000 mg | DELAYED_RELEASE_TABLET | Freq: Every day | ORAL | Status: DC

## 2015-04-23 MED FILL — Heparin Sodium (Porcine) 2 Unit/ML in Sodium Chloride 0.9%: INTRAMUSCULAR | Qty: 1000 | Status: AC

## 2015-04-23 MED FILL — Lidocaine HCl Local Preservative Free (PF) Inj 1%: INTRAMUSCULAR | Qty: 30 | Status: AC

## 2015-04-23 NOTE — Discharge Summary (Signed)
Discharge Summary   Patient ID: [name] [name],  MRN: [id number], DOB/AGE: [birth year] 69 y.o.  Admit date: 04/22/2015 Discharge date: 04/23/2015  Primary Care Provider: Precious [name] Primary Cardiologist: [name] [name] PAD specialist: [name] [name]  Discharge Diagnoses Principal Problem:   PVD (peripheral vascular disease) with claudication Active Problems:   Diabetes   HYPERCHOLESTEROLEMIA   Essential hypertension   Coronary atherosclerosis   Coronary artery disease   S/P AVR   Renal artery stenosis   Allergies No Known Allergies  Procedures  Lower extremity angiography 04/22/2015 Procedures Performed: [name] [name] atherectomy right SFA 2. PTA right SFA with Lutonix drug-eluting balloon  AO SYSTOLIC/AO DIASTOLIC: 88/50  Angiographic Data: See procedure description  IMPRESSION:[name] [name] has high-grade calcified exophytic mid right SFA stenosis. He has an aortobifem. We will proceed with antegrade puncture followed by [name] directional atherectomy using distal protection  Procedure Description:the patient received 11,000 units of heparin intravenously with an ACT of 220. A total of 87 mL of contrast was a minister to the patient. The lesion was crossed with a 014 [place] Guidewire after which a 7 mm spider distal protection device was then deployed in the above-the-knee popliteal artery. Following this [name] [name] directional atherectomy was performed of the mid right SFA performing multiple circumferential cuts. A moderate amount of white fibrous calcified plaque was removed. Completion angiography was performed and the distal protection device was withdrawn. Excellent flow was demonstrated angiographically of the trifurcation.     Hospital Course  [name] [name] is a 69 yo male with h/o HTN, HLD, DM, s/p St Jude AVR 2004, PAD s/p aortobifem bypass and stenting and CAD s/p CABG 2004 who was referred to [name] [name] by [name] [name] for resistant hypertension and duplex evidence of  bilateral renal artery stenosis. He underwent LE angiography in 02/2015 which showed widely patent right renal artery, 80% left renal artery stenosis treated successfully with renal artery stenting. The study also showed 90% focal calcified mid right SFA stenosis with three-vessel runoff, patent left common femoral endarterectomy site with 3 vessel runoff, intact aortal bifemoral bypass graft. On followup, he continue to complain of right calf claudication symptom with evidence of high-grade calcific lesion in the mid to distal right SFA.  He underwent planned lower extremity angiography on 04/22/2015 and had [name] [name] atherectomy and PTA of R SFA with Lutonix DES. He has been plavix. He will restart on Coumadin given his mechanical St. Jude aortic valve. Per patient, he has enough Lovenox at home for bridging and he has been instructed before on how to use Lovenox.  He was seen in the morning of 04/23/2015, at which time he denies significant discomfort. His right groin cath site appears to be stable without significant bleeding. He is deemed stable for discharge from cardiology perspective. I will arrange outpatient R LE arterial doppler in [name] week and follow-up with Dr. Kennon Holter PA/NP in the clinic in 2-3 weeks.    Discharge Vitals Blood pressure 164/31, pulse 76, temperature 98.5 F (36.9 C), temperature source Oral, resp. rate 19, height '5\' 8"'$  ([name].727 m), weight 175 lb 0.7 oz (79.4 kg), SpO2 91 %.  Filed Weights   04/22/15 0801 04/23/15 0012  Weight: 176 lb (79.833 kg) 175 lb 0.7 oz (79.4 kg)    Labs  CBC  Recent Labs  04/23/15 0359  WBC 8.3  HGB 14.9  HCT 43.9  MCV 93.6  PLT 277   Basic Metabolic Panel  Recent Labs  04/23/15 0359  NA 137  K 3.7  CL 103  CO2 26  GLUCOSE 91  BUN 12  CREATININE 0.80  CALCIUM 8.9    Disposition  Pt is being discharged home today in good condition.  Follow-up Plans & Appointments      Follow-up Information    Follow up with [name] [name].   Specialties:  Cardiology, Radiology   Why:  Office will contact you to arrange followup, please give [place] a call if you do not hear from [place] in 2 business days   Contact information:   [address] [place] [address] [phone]       Follow up with [facility].   Why:  Office will contact you to arrange followup with [name] [name] in 2-3 weeks and outpatient R LE arterial doppler in [name] week   Contact information:   [place] [phone]      Discharge Medications    Medication List    TAKE these medications        amLODipine 10 MG tablet  Commonly known as:  NORVASC  Take 10 mg by mouth daily.     aspirin 325 MG EC tablet  Take [name] tablet (325 mg total) by mouth daily.     atorvastatin 40 MG tablet  Commonly known as:  LIPITOR  Take 40 mg by mouth daily.     canagliflozin 100 MG Tabs tablet  Commonly known as:  INVOKANA  Take 100 mg by mouth daily.     clopidogrel 75 MG tablet  Commonly known as:  PLAVIX  Take [name] tablet (75 mg total) by mouth daily.     enoxaparin 80 MG/0.8ML injection  Commonly known as:  LOVENOX  Inject 0.8 mLs (80 mg total) into the skin every 12 (twelve) hours.     ezetimibe 10 MG tablet  Commonly known as:  ZETIA  Take 10 mg by mouth daily.     glyBURIDE 2.5 MG tablet  Commonly known as:  DIABETA  Take 2.5-5 mg by mouth 2 (two) times daily. Take 2 tablets (5 mg) every morning and [name] tablet (2.5 mg) at bedtime     hydrochlorothiazide 12.5 MG capsule  Commonly known as:  MICROZIDE  TAKE ONE CAPSULE BY MOUTH EVERY DAY     metFORMIN 500 MG tablet  Commonly known as:  GLUCOPHAGE  Take 500 mg by mouth at bedtime.     metoprolol succinate 50 MG 24 hr tablet  Commonly known as:  TOPROL-XL  TAKE [name] TABLET BY MOUTH EVERY DAY     sildenafil 100 MG tablet  Commonly known as:  VIAGRA  Take 100 mg by mouth daily as needed for erectile dysfunction.      trandolapril 4 MG tablet  Commonly known as:  MAVIK  TAKE [name] TABLET BY MOUTH EVERY DAY     warfarin 7.5 MG tablet  Commonly known as:  COUMADIN  TAKE [name] TABLET DAILY, EXCEPT TAKE [name] AND [name]/2 TABLETS ON SATURDAYS OR AS DIRECTED BY COUMADIN CLINIC        Outstanding Labs/Studies  Outpatient R LE doppler U/S  Duration of Discharge Encounter   Greater than 30 minutes including physician time.  Hilbert Corrigan PA-C Pager: [phone] 04/23/2015, 11:54 AM   I have examined the patient and reviewed assessment and plan and discussed with patient. Agree with above as stated. Small right groin hematoma. Palpable right DP pulse, [name]+. Feels well walking. Anticoagulation bridge previously arranged. OK for  d/c.Crisp S2 click on exam.   F/u with [name] [name].  Ultrasounds per protocol.   Meerab Maselli S.

## 2015-04-23 NOTE — Progress Notes (Signed)
Patient Name: [name] [name] Date of Encounter: 04/23/2015  Primary Cardiologist: Dr. Johnsie Cancel PAD specialist: Dr. Gwenlyn Found   Principal Problem:   PVD (peripheral vascular disease) with claudication Active Problems:   Diabetes   HYPERCHOLESTEROLEMIA   Essential hypertension   Coronary atherosclerosis   Coronary artery disease   S/P AVR   Renal artery stenosis    SUBJECTIVE  Denies any chest pain or SOB. R groin mildly sore, otherwise had a good night.   CURRENT MEDS . amLODipine  10 mg Oral Daily  . aspirin EC  325 mg Oral Daily  . atorvastatin  40 mg Oral Daily  . canagliflozin  100 mg Oral Daily  . clopidogrel  75 mg Oral Q breakfast  . ezetimibe  10 mg Oral Daily  . glyBURIDE  2.5 mg Oral Q supper  . glyBURIDE  5 mg Oral Q breakfast  . hydrochlorothiazide  12.5 mg Oral Daily  . insulin aspart  0-9 Units Subcutaneous TID WC  . metoprolol succinate  50 mg Oral Daily    OBJECTIVE  Filed Vitals:   04/22/15 2247 04/23/15 0012 04/23/15 0403 04/23/15 0500  BP: 163/41 157/55 175/36 155/30  Pulse:  67 71   Temp:  98.3 F (36.8 C) 98.6 F (37 C)   TempSrc:  Oral Oral   Resp:  '20 20 20  '$ Height:      Weight:  175 lb 0.7 oz (79.4 kg)    SpO2:  93% 94% 95%    Intake/Output Summary (Last 24 hours) at 04/23/15 0707 Last data filed at 04/22/15 1700  Gross per 24 hour  Intake    240 ml  Output    850 ml  Net   -610 ml   Filed Weights   04/22/15 0801 04/23/15 0012  Weight: 176 lb (79.833 kg) 175 lb 0.7 oz (79.4 kg)    PHYSICAL EXAM  General: Pleasant, NAD. Neuro: Alert and oriented X 3. Moves all extremities spontaneously. Psych: Normal affect. HEENT:  Normal  Neck: Supple without bruits or JVD. Lungs:  Resp regular and unlabored, CTA. Heart: RRR no s3, s4. Loud mechanical click at R upper sternal border. R groin cath site stable, small hematoma, no bleeding. Abdomen: Soft, non-tender, non-distended, BS + x 4.  Extremities: No clubbing, cyanosis or  edema. DP/PT/Radials 2+ and equal bilaterally.  Accessory Clinical Findings  CBC  Recent Labs  04/23/15 0359  WBC 8.3  HGB 14.9  HCT 43.9  MCV 93.6  PLT 163   Basic Metabolic Panel  Recent Labs  04/23/15 0359  NA 137  K 3.7  CL 103  CO2 26  GLUCOSE 91  BUN 12  CREATININE 0.80  CALCIUM 8.9    TELE NSR with HR 60s    ECG  No new EKG  Echocardiogram 05/06/2013  LV EF: 60% -  65%  ------------------------------------------------------------ Indications:   CVA 436.  ------------------------------------------------------------ History:  Risk factors: Former tobacco use. Hypertension. Diabetes mellitus. Dyslipidemia.  ------------------------------------------------------------ Study Conclusions  - Left ventricle: The cavity size was normal. Wall thickness was increased in a pattern of moderate LVH. Systolic function was normal. The estimated ejection fraction was in the range of 60% to 65%. Wall motion was normal; there were no regional wall motion abnormalities. - Aortic valve: A St. Jude Medical mechanical prosthesis was present and functioning normally. - Mitral valve: Mildly to moderately calcified annulus. Mildly calcified leaflets . - Left atrium: The atrium was mildly to moderately dilated. Impressions:  - No cardiac  source of embolism was identified, but cannot be ruled out on the basis of this examination. A TEE would better evaluate the prosthetic valve    Radiology/Studies  No results found.  ASSESSMENT AND PLAN  69 yo male with h/o HTN, HLD, DM, s/p AVR 2004, PAD s/p aortobifem bypass and stenting and CAD s/p CABG 2004 referred to Dr. Gwenlyn Found by Dr. Johnsie Cancel for resistent HTN and duplex evidence of bilateral renal artery stenosis. He also has continued R calf claudication with high grade calcific lesion in mid to distal R SFA. S/p L renal artery stenting in 02/2015. Came in for LE angiography and PTA/stenting of R  SFA  1. Claudication with h/o PAD  - LE angiography on 02/21/2015 90% focal calcified mid R SFA stenosis with 3v runoff. Patent L common femoral endarterectomy site with 3v runoff, intact aortobifemoral bypass graft  - repeat LE angiography 04/22/2015 [facility] 1 atherectomy and PTA of R SFA with Lutonix DES  - will arrange LE arterial doppler in 1 week and followup with Dr. Kennon Holter PA/NP in 2-3 weeks  - discharge today on plavix and coumadin with lovenox bridge  - restart trandolapril tomorrow. Hold metformin for 48 hrs after cath  2. Resistent HTN  - LE angiography in 02/2015 showed widely patent R renal artery, 80% L renal artery stenosis  - s/p successful L renal artery stenting in 02/2015  3. PAD s/p aortobifem bypass 4. HTN 5. HLD 6. DM 7. s/p St Jude AVR 2004: on coumadin at home  - will need to start lovenox with coumadin brige prior to discharge (per pt, he already has lovenox with correct dosage at home and resume when he gets home)  8. H/o renal artery stenosis: s/p successful L renal artery stenting 02/2015   SignedAlmyra Deforest PA-C Pager: [phone]   I have examined the patient and reviewed assessment and plan and discussed with patient.  Agree with above as stated.  Small right groin hematoma.  Palpable right DP pulse, 1+.  Feels well walking.  Anticoagulation bridge previously arranged.  OK for d/c.  F/u with Dr. Gwenlyn Found.  Sarin Comunale S.

## 2015-04-23 NOTE — Discharge Instructions (Signed)
Please hold metformin for 48 hours after cath due to interaction with the contrast dye. You can resume on 04/25/2015  No driving for 24 hours. No lifting over 5 lbs for 1 week. No sexual activity for 1 week. Keep procedure site clean & dry. If you notice increased pain, swelling, bleeding or pus, call/return!  You may shower, but no soaking baths/hot tubs/pools for 1 week.

## 2015-04-24 ENCOUNTER — Encounter (HOSPITAL_COMMUNITY): Payer: Self-pay | Admitting: Cardiovascular Disease

## 2015-04-24 NOTE — Telephone Encounter (Signed)
Closed encounter °

## 2015-04-25 ENCOUNTER — Other Ambulatory Visit: Payer: Self-pay | Admitting: Cardiovascular Disease

## 2015-04-25 ENCOUNTER — Telehealth (HOSPITAL_COMMUNITY): Payer: Self-pay | Admitting: *Deleted

## 2015-04-25 DIAGNOSIS — I739 Peripheral vascular disease, unspecified: Secondary | ICD-10-CM

## 2015-04-29 ENCOUNTER — Ambulatory Visit (INDEPENDENT_AMBULATORY_CARE_PROVIDER_SITE_OTHER): Payer: Medicare Other | Admitting: *Deleted

## 2015-04-29 DIAGNOSIS — I359 Nonrheumatic aortic valve disorder, unspecified: Secondary | ICD-10-CM | POA: Diagnosis not present

## 2015-04-29 DIAGNOSIS — G459 Transient cerebral ischemic attack, unspecified: Secondary | ICD-10-CM

## 2015-04-29 DIAGNOSIS — Z5181 Encounter for therapeutic drug level monitoring: Secondary | ICD-10-CM

## 2015-04-29 LAB — POCT INR: INR: 1.7

## 2015-05-01 ENCOUNTER — Ambulatory Visit (HOSPITAL_COMMUNITY)
Admission: RE | Admit: 2015-05-01 | Discharge: 2015-05-01 | Disposition: A | Discharge status: Home / Self Care | Payer: Medicare Other | Source: Ambulatory Visit | Attending: Cardiovascular Disease | Admitting: Cardiovascular Disease

## 2015-05-01 DIAGNOSIS — I739 Peripheral vascular disease, unspecified: Secondary | ICD-10-CM | POA: Insufficient documentation

## 2015-05-05 ENCOUNTER — Other Ambulatory Visit: Payer: Self-pay | Admitting: Cardiovascular Disease

## 2015-05-07 DIAGNOSIS — Z6826 Body mass index (BMI) 26.0-26.9, adult: Secondary | ICD-10-CM | POA: Diagnosis not present

## 2015-05-07 DIAGNOSIS — I701 Atherosclerosis of renal artery: Secondary | ICD-10-CM | POA: Diagnosis not present

## 2015-05-07 DIAGNOSIS — I119 Hypertensive heart disease without heart failure: Secondary | ICD-10-CM | POA: Diagnosis not present

## 2015-05-07 DIAGNOSIS — E785 Hyperlipidemia, unspecified: Secondary | ICD-10-CM | POA: Diagnosis not present

## 2015-05-07 DIAGNOSIS — E1151 Type 2 diabetes mellitus with diabetic peripheral angiopathy without gangrene: Secondary | ICD-10-CM | POA: Diagnosis not present

## 2015-05-08 ENCOUNTER — Ambulatory Visit (INDEPENDENT_AMBULATORY_CARE_PROVIDER_SITE_OTHER): Payer: Medicare Other | Admitting: *Deleted

## 2015-05-08 ENCOUNTER — Other Ambulatory Visit: Payer: Self-pay | Admitting: Cardiovascular Disease

## 2015-05-08 DIAGNOSIS — I359 Nonrheumatic aortic valve disorder, unspecified: Secondary | ICD-10-CM | POA: Diagnosis not present

## 2015-05-08 DIAGNOSIS — Z5181 Encounter for therapeutic drug level monitoring: Secondary | ICD-10-CM | POA: Diagnosis not present

## 2015-05-08 DIAGNOSIS — G459 Transient cerebral ischemic attack, unspecified: Secondary | ICD-10-CM | POA: Diagnosis not present

## 2015-05-08 LAB — POCT INR: INR: 2.7

## 2015-05-15 ENCOUNTER — Encounter: Payer: Self-pay | Admitting: Cardiovascular Disease

## 2015-05-15 ENCOUNTER — Ambulatory Visit (INDEPENDENT_AMBULATORY_CARE_PROVIDER_SITE_OTHER): Payer: Medicare Other | Admitting: Cardiovascular Disease

## 2015-05-15 VITALS — BP 122/60 | HR 68 | Ht 68.0 in | Wt 174.7 lb

## 2015-05-15 DIAGNOSIS — I701 Atherosclerosis of renal artery: Secondary | ICD-10-CM

## 2015-05-15 DIAGNOSIS — I739 Peripheral vascular disease, unspecified: Secondary | ICD-10-CM

## 2015-05-15 NOTE — Patient Instructions (Signed)
Your physician wants you to follow-up in: 1 year with Dr Berry. You will receive a reminder letter in the mail two months in advance. If you don't receive a letter, please call our office to schedule the follow-up appointment.  

## 2015-05-15 NOTE — Assessment & Plan Note (Signed)
History of PAD with right calf lifestyle limiting claudication status post directional atherectomy performed via the antegrade approach 04/22/15 revealed removing a high-grade focal eccentric calcified mid to distal right SFA plaque. He did have moderate disease at the origin of his right SFA. Follow-up Dopplers performed 05/01/15 revealed his mid to distal plaque was widely patent with some progression of his proximal right SFA origin stenosis. His symptoms have markedly improved. We'll continue to follow him by semiannual to flex ultrasound. Should his proximal disease progressed we would have to review his angiograms to determine whether or not I could fix him antegrade with a short sheath via a direct puncture of his graft as opposed to retrograde popliteal puncture. The latter would require referring him to Dr. Brunetta Jeans in [place].

## 2015-05-15 NOTE — Assessment & Plan Note (Signed)
History of renal vascular hypertension with documented documented left renal artery stenosis status post left renal artery stenting by myself 02/21/15 with follow-up Doppler suggesting his renal left renal artery was widely patent. His blood pressure has been under better control.

## 2015-05-15 NOTE — Progress Notes (Signed)
05/15/2015 Hoy Fallert   01-29-1946  [id number]  Primary Physician Precious Reel, MD Primary Cardiologist: Lorretta Harp MD Renae Gloss   HPI:  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 His primary care physician is Dr. Virgina Jock. He was referred by Dr. Johnsie Cancel for peripheral vascular evaluation because of resistant hypertension with duplex evidence of bilateral renal artery stenosis as well as new-onset right calf claudication with recent Dopplers that showed a lesion in his mid right SFA. He does have a history of remote tobacco abuse as well as treated hypertension, hyperlipidemia and diabetes. He has had coronary bypass grafting and St. Jude aVR performed by Dr. Cyndia Bent December 2004 on Coumadin anticoagulation. He has also had aortobifemoral bypass grafting, left external iliac stenting and left common femoral endarterectomy performed by Dr. Donnetta Hutching. I performed left renal artery stenting 02/21/15 with an excellent angiographic and clinical result. His blood pressure has been under better control. I then perform staged right SFA directional atherectomy 04/22/15 removing a high-grade eccentric calcified mid to distal right SFA plaque. He did have progression of disease at the origin of his right SFA. His claudication is markedly improved.   Current Outpatient Prescriptions  Medication Sig Dispense Refill  . amLODipine (NORVASC) 10 MG tablet Take 10 mg by mouth daily.  5  . aspirin EC 81 MG tablet Take 81 mg by mouth daily.    [name] atorvastatin (LIPITOR) 40 MG tablet Take 40 mg by mouth daily.     . canagliflozin (INVOKANA) 100 MG TABS tablet Take 100 mg by mouth daily.    . clopidogrel (PLAVIX) 75 MG tablet Take 1 tablet (75 mg total)  by mouth daily. 90 tablet 1  . ezetimibe (ZETIA) 10 MG tablet Take 10 mg by mouth daily.    [name] glyBURIDE (DIABETA) 2.5 MG tablet Take 2.5-5 mg by mouth 2 (two) times daily. Take 2 tablets (5 mg) every morning and 1 tablet (2.5 mg) at bedtime    . hydrochlorothiazide (MICROZIDE) 12.5 MG capsule TAKE ONE CAPSULE BY MOUTH EVERY DAY 30 capsule 5  . metFORMIN (GLUCOPHAGE) 500 MG tablet Take 500 mg by mouth at bedtime.     . metoprolol succinate (TOPROL-XL) 50 MG 24 hr tablet TAKE 1 TABLET BY MOUTH EVERY DAY 30 tablet 5  . sildenafil (VIAGRA) 100 MG tablet Take 100 mg by mouth daily as needed for erectile dysfunction.    . trandolapril (MAVIK) 4 MG tablet TAKE 1 TABLET BY MOUTH EVERY DAY 30 tablet 5  . warfarin (COUMADIN) 7.5 MG tablet Take 1 tablet (7.5 mg total) by mouth daily as directed per coumadin clinic.     No current facility-administered medications for this visit.    No Known Allergies  History   Social History  . Marital Status: Divorced    Spouse Name: N/A  . Number of Children: N/A  . Years of Education: N/A   Occupational History  . Not on file.   Social History Main Topics  . Smoking status: Former Smoker -- 1.50 packs/day for 30 years    Types: Cigarettes    Quit date: 12/15/1993  . Smokeless tobacco: Never Used  . Alcohol Use: 7.0 oz/week    14 Standard drinks or equivalent per week  Comment: drinks 2 martini's a night  . Drug Use: No  . Sexual Activity: Yes   Other Topics Concern  . Not on file   Social History Narrative   Tries to remain active.  Frequent golfer but claudication limits this.     Review of Systems: General: negative for chills, fever, night sweats or weight changes.  Cardiovascular: negative for chest pain, dyspnea on exertion, edema, orthopnea, palpitations, paroxysmal nocturnal dyspnea or shortness of breath Dermatological: negative for rash Respiratory: negative for cough or wheezing Urologic: negative for hematuria Abdominal:  negative for nausea, vomiting, diarrhea, bright red blood per rectum, melena, or hematemesis Neurologic: negative for visual changes, syncope, or dizziness All other systems reviewed and are otherwise negative except as noted above.    Blood pressure 122/60, pulse 68, height '5\' 8"'$  (1.727 m), weight 174 lb 11.2 oz (79.243 kg).  General appearance: alert and no distress Neck: no adenopathy, no carotid bruit, no JVD, supple, symmetrical, trachea midline and thyroid not enlarged, symmetric, no tenderness/mass/nodules Lungs: clear to auscultation bilaterally Abdomen: soft, non-tender; bowel sounds normal; no masses,  no organomegaly  EKG not performed today  ASSESSMENT AND PLAN:   Renal artery arteriosclerosis History of renal vascular hypertension with documented documented left renal artery stenosis status post left renal artery stenting by myself 02/21/15 with follow-up Doppler suggesting his renal left renal artery was widely patent. His blood pressure has been under better control.  PVD (peripheral vascular disease) with claudication History of PAD with right calf lifestyle limiting claudication status post directional atherectomy performed via the antegrade approach 04/22/15 revealed removing a high-grade focal eccentric calcified mid to distal right SFA plaque. He did have moderate disease at the origin of his right SFA. Follow-up Dopplers performed 05/01/15 revealed his mid to distal plaque was widely patent with some progression of his proximal right SFA origin stenosis. His symptoms have markedly improved. We'll continue to follow him by semiannual to flex ultrasound. Should his proximal disease progressed we would have to review his angiograms to determine whether or not I could fix him antegrade with a short sheath via a direct puncture of his graft as opposed to retrograde popliteal puncture. The latter would require referring him to Dr. Brunetta Jeans in [place].      Lorretta Harp  MD FACP,FACC,FAHA, [facility] 05/15/2015 11:48 AM

## 2015-05-21 DIAGNOSIS — D2372 Other benign neoplasm of skin of left lower limb, including hip: Secondary | ICD-10-CM | POA: Diagnosis not present

## 2015-05-21 DIAGNOSIS — D225 Melanocytic nevi of trunk: Secondary | ICD-10-CM | POA: Diagnosis not present

## 2015-05-21 DIAGNOSIS — D1801 Hemangioma of skin and subcutaneous tissue: Secondary | ICD-10-CM | POA: Diagnosis not present

## 2015-05-21 DIAGNOSIS — L814 Other melanin hyperpigmentation: Secondary | ICD-10-CM | POA: Diagnosis not present

## 2015-05-21 DIAGNOSIS — L570 Actinic keratosis: Secondary | ICD-10-CM | POA: Diagnosis not present

## 2015-05-21 DIAGNOSIS — D2271 Melanocytic nevi of right lower limb, including hip: Secondary | ICD-10-CM | POA: Diagnosis not present

## 2015-05-21 DIAGNOSIS — D485 Neoplasm of uncertain behavior of skin: Secondary | ICD-10-CM | POA: Diagnosis not present

## 2015-06-05 ENCOUNTER — Ambulatory Visit (INDEPENDENT_AMBULATORY_CARE_PROVIDER_SITE_OTHER): Payer: Medicare Other | Admitting: *Deleted

## 2015-06-05 DIAGNOSIS — I359 Nonrheumatic aortic valve disorder, unspecified: Secondary | ICD-10-CM

## 2015-06-05 DIAGNOSIS — Z5181 Encounter for therapeutic drug level monitoring: Secondary | ICD-10-CM

## 2015-06-05 DIAGNOSIS — G459 Transient cerebral ischemic attack, unspecified: Secondary | ICD-10-CM | POA: Diagnosis not present

## 2015-06-05 LAB — POCT INR: INR: 2.9

## 2015-06-11 ENCOUNTER — Telehealth: Payer: Self-pay | Admitting: *Deleted

## 2015-06-11 NOTE — Telephone Encounter (Signed)
Called [name] [name] for his 30 day follow-up for the [facility] registry. He states he has been doing well and has no additional procedures on the target lesion and no adverse events.

## 2015-07-16 ENCOUNTER — Other Ambulatory Visit: Payer: Self-pay | Admitting: Cardiovascular Disease

## 2015-07-17 ENCOUNTER — Ambulatory Visit (INDEPENDENT_AMBULATORY_CARE_PROVIDER_SITE_OTHER): Payer: Medicare Other | Admitting: *Deleted

## 2015-07-17 DIAGNOSIS — Z5181 Encounter for therapeutic drug level monitoring: Secondary | ICD-10-CM | POA: Diagnosis not present

## 2015-07-17 DIAGNOSIS — G459 Transient cerebral ischemic attack, unspecified: Secondary | ICD-10-CM | POA: Diagnosis not present

## 2015-07-17 DIAGNOSIS — I359 Nonrheumatic aortic valve disorder, unspecified: Secondary | ICD-10-CM | POA: Diagnosis not present

## 2015-07-17 LAB — POCT INR: INR: 2.4

## 2015-08-11 ENCOUNTER — Other Ambulatory Visit: Payer: Self-pay | Admitting: Cardiovascular Disease

## 2015-08-14 ENCOUNTER — Ambulatory Visit (INDEPENDENT_AMBULATORY_CARE_PROVIDER_SITE_OTHER): Payer: Medicare Other | Admitting: *Deleted

## 2015-08-14 DIAGNOSIS — I359 Nonrheumatic aortic valve disorder, unspecified: Secondary | ICD-10-CM

## 2015-08-14 DIAGNOSIS — Z5181 Encounter for therapeutic drug level monitoring: Secondary | ICD-10-CM

## 2015-08-14 DIAGNOSIS — G459 Transient cerebral ischemic attack, unspecified: Secondary | ICD-10-CM | POA: Diagnosis not present

## 2015-08-14 LAB — POCT INR: INR: 3.3

## 2015-09-08 ENCOUNTER — Other Ambulatory Visit: Payer: Self-pay | Admitting: Cardiovascular Disease

## 2015-09-10 DIAGNOSIS — E1151 Type 2 diabetes mellitus with diabetic peripheral angiopathy without gangrene: Secondary | ICD-10-CM | POA: Diagnosis not present

## 2015-09-10 DIAGNOSIS — I739 Peripheral vascular disease, unspecified: Secondary | ICD-10-CM | POA: Diagnosis not present

## 2015-09-10 DIAGNOSIS — I119 Hypertensive heart disease without heart failure: Secondary | ICD-10-CM | POA: Diagnosis not present

## 2015-09-10 DIAGNOSIS — Z23 Encounter for immunization: Secondary | ICD-10-CM | POA: Diagnosis not present

## 2015-09-10 DIAGNOSIS — I701 Atherosclerosis of renal artery: Secondary | ICD-10-CM | POA: Diagnosis not present

## 2015-09-10 DIAGNOSIS — Z6826 Body mass index (BMI) 26.0-26.9, adult: Secondary | ICD-10-CM | POA: Diagnosis not present

## 2015-09-10 DIAGNOSIS — I10 Essential (primary) hypertension: Secondary | ICD-10-CM | POA: Diagnosis not present

## 2015-09-10 DIAGNOSIS — Z952 Presence of prosthetic heart valve: Secondary | ICD-10-CM | POA: Diagnosis not present

## 2015-09-11 ENCOUNTER — Ambulatory Visit (INDEPENDENT_AMBULATORY_CARE_PROVIDER_SITE_OTHER): Payer: Medicare Other | Admitting: Pharmacist

## 2015-09-11 DIAGNOSIS — G459 Transient cerebral ischemic attack, unspecified: Secondary | ICD-10-CM | POA: Diagnosis not present

## 2015-09-11 DIAGNOSIS — I359 Nonrheumatic aortic valve disorder, unspecified: Secondary | ICD-10-CM | POA: Diagnosis not present

## 2015-09-11 DIAGNOSIS — Z5181 Encounter for therapeutic drug level monitoring: Secondary | ICD-10-CM | POA: Diagnosis not present

## 2015-09-11 LAB — POCT INR: INR: 1.8

## 2015-09-12 ENCOUNTER — Other Ambulatory Visit: Payer: Self-pay | Admitting: Cardiovascular Disease

## 2015-09-12 NOTE — Telephone Encounter (Signed)
Rx(s) sent to pharmacy electronically.  

## 2015-09-25 ENCOUNTER — Ambulatory Visit (INDEPENDENT_AMBULATORY_CARE_PROVIDER_SITE_OTHER): Payer: Medicare Other | Admitting: *Deleted

## 2015-09-25 DIAGNOSIS — Z5181 Encounter for therapeutic drug level monitoring: Secondary | ICD-10-CM | POA: Diagnosis not present

## 2015-09-25 DIAGNOSIS — I359 Nonrheumatic aortic valve disorder, unspecified: Secondary | ICD-10-CM | POA: Diagnosis not present

## 2015-09-25 DIAGNOSIS — G459 Transient cerebral ischemic attack, unspecified: Secondary | ICD-10-CM

## 2015-09-25 LAB — POCT INR: INR: 1.9

## 2015-10-09 ENCOUNTER — Ambulatory Visit (INDEPENDENT_AMBULATORY_CARE_PROVIDER_SITE_OTHER): Payer: Medicare Other | Admitting: *Deleted

## 2015-10-09 DIAGNOSIS — I359 Nonrheumatic aortic valve disorder, unspecified: Secondary | ICD-10-CM

## 2015-10-09 DIAGNOSIS — Z5181 Encounter for therapeutic drug level monitoring: Secondary | ICD-10-CM

## 2015-10-09 DIAGNOSIS — G459 Transient cerebral ischemic attack, unspecified: Secondary | ICD-10-CM

## 2015-10-09 LAB — POCT INR: INR: 2.5

## 2015-10-28 ENCOUNTER — Ambulatory Visit (INDEPENDENT_AMBULATORY_CARE_PROVIDER_SITE_OTHER): Payer: Medicare Other | Admitting: *Deleted

## 2015-10-28 DIAGNOSIS — I359 Nonrheumatic aortic valve disorder, unspecified: Secondary | ICD-10-CM | POA: Diagnosis not present

## 2015-10-28 DIAGNOSIS — Z5181 Encounter for therapeutic drug level monitoring: Secondary | ICD-10-CM

## 2015-10-28 DIAGNOSIS — G459 Transient cerebral ischemic attack, unspecified: Secondary | ICD-10-CM | POA: Diagnosis not present

## 2015-10-28 LAB — POCT INR: INR: 1.6

## 2015-10-29 ENCOUNTER — Encounter: Payer: Self-pay | Admitting: *Deleted

## 2015-10-29 DIAGNOSIS — Z006 Encounter for examination for normal comparison and control in clinical research program: Secondary | ICD-10-CM

## 2015-10-29 NOTE — Progress Notes (Signed)
Called [name] [name] for his 48-month[facility] registry follow-up. He states he is doing well and has had no additional procedures to his lower extremities. He had no questions I will follow-up again in six months.

## 2015-10-30 ENCOUNTER — Other Ambulatory Visit: Payer: Self-pay | Admitting: Cardiovascular Disease

## 2015-10-30 DIAGNOSIS — I739 Peripheral vascular disease, unspecified: Secondary | ICD-10-CM

## 2015-11-11 ENCOUNTER — Ambulatory Visit (INDEPENDENT_AMBULATORY_CARE_PROVIDER_SITE_OTHER): Payer: Medicare Other | Admitting: *Deleted

## 2015-11-11 DIAGNOSIS — Z5181 Encounter for therapeutic drug level monitoring: Secondary | ICD-10-CM

## 2015-11-11 DIAGNOSIS — I359 Nonrheumatic aortic valve disorder, unspecified: Secondary | ICD-10-CM | POA: Diagnosis not present

## 2015-11-11 DIAGNOSIS — G459 Transient cerebral ischemic attack, unspecified: Secondary | ICD-10-CM | POA: Diagnosis not present

## 2015-11-11 LAB — POCT INR: INR: 2.3

## 2015-11-13 ENCOUNTER — Other Ambulatory Visit: Payer: Self-pay | Admitting: Cardiovascular Disease

## 2015-11-13 ENCOUNTER — Ambulatory Visit (HOSPITAL_COMMUNITY)
Admission: RE | Admit: 2015-11-13 | Discharge: 2015-11-13 | Disposition: A | Discharge status: Home / Self Care | Payer: Medicare Other | Source: Ambulatory Visit | Attending: Cardiovascular Disease | Admitting: Cardiovascular Disease

## 2015-11-13 DIAGNOSIS — I739 Peripheral vascular disease, unspecified: Secondary | ICD-10-CM

## 2015-11-20 DIAGNOSIS — L905 Scar conditions and fibrosis of skin: Secondary | ICD-10-CM | POA: Diagnosis not present

## 2015-11-20 DIAGNOSIS — L570 Actinic keratosis: Secondary | ICD-10-CM | POA: Diagnosis not present

## 2015-11-25 ENCOUNTER — Ambulatory Visit (INDEPENDENT_AMBULATORY_CARE_PROVIDER_SITE_OTHER): Payer: Medicare Other

## 2015-11-25 DIAGNOSIS — I359 Nonrheumatic aortic valve disorder, unspecified: Secondary | ICD-10-CM

## 2015-11-25 DIAGNOSIS — G459 Transient cerebral ischemic attack, unspecified: Secondary | ICD-10-CM

## 2015-11-25 DIAGNOSIS — Z5181 Encounter for therapeutic drug level monitoring: Secondary | ICD-10-CM

## 2015-11-25 LAB — POCT INR: INR: 2.2

## 2015-12-11 ENCOUNTER — Ambulatory Visit (INDEPENDENT_AMBULATORY_CARE_PROVIDER_SITE_OTHER): Payer: Medicare Other | Admitting: *Deleted

## 2015-12-11 DIAGNOSIS — Z5181 Encounter for therapeutic drug level monitoring: Secondary | ICD-10-CM

## 2015-12-11 DIAGNOSIS — I359 Nonrheumatic aortic valve disorder, unspecified: Secondary | ICD-10-CM | POA: Diagnosis not present

## 2015-12-11 DIAGNOSIS — G459 Transient cerebral ischemic attack, unspecified: Secondary | ICD-10-CM | POA: Diagnosis not present

## 2015-12-11 LAB — POCT INR: INR: 3.3

## 2016-01-02 ENCOUNTER — Ambulatory Visit (INDEPENDENT_AMBULATORY_CARE_PROVIDER_SITE_OTHER): Payer: Medicare Other | Admitting: *Deleted

## 2016-01-02 DIAGNOSIS — G459 Transient cerebral ischemic attack, unspecified: Secondary | ICD-10-CM

## 2016-01-02 DIAGNOSIS — Z5181 Encounter for therapeutic drug level monitoring: Secondary | ICD-10-CM | POA: Diagnosis not present

## 2016-01-02 DIAGNOSIS — I359 Nonrheumatic aortic valve disorder, unspecified: Secondary | ICD-10-CM

## 2016-01-02 LAB — POCT INR: INR: 3.6

## 2016-01-07 DIAGNOSIS — Z0000 Encounter for general adult medical examination without abnormal findings: Secondary | ICD-10-CM | POA: Diagnosis not present

## 2016-01-07 DIAGNOSIS — I10 Essential (primary) hypertension: Secondary | ICD-10-CM | POA: Diagnosis not present

## 2016-01-07 DIAGNOSIS — Z125 Encounter for screening for malignant neoplasm of prostate: Secondary | ICD-10-CM | POA: Diagnosis not present

## 2016-01-07 DIAGNOSIS — E1151 Type 2 diabetes mellitus with diabetic peripheral angiopathy without gangrene: Secondary | ICD-10-CM | POA: Diagnosis not present

## 2016-01-07 DIAGNOSIS — I700 Atherosclerosis of aorta: Secondary | ICD-10-CM | POA: Diagnosis not present

## 2016-01-07 DIAGNOSIS — E784 Other hyperlipidemia: Secondary | ICD-10-CM | POA: Diagnosis not present

## 2016-01-07 DIAGNOSIS — I739 Peripheral vascular disease, unspecified: Secondary | ICD-10-CM | POA: Diagnosis not present

## 2016-01-09 ENCOUNTER — Other Ambulatory Visit: Payer: Self-pay | Admitting: Cardiovascular Disease

## 2016-01-09 DIAGNOSIS — I6523 Occlusion and stenosis of bilateral carotid arteries: Secondary | ICD-10-CM

## 2016-01-11 ENCOUNTER — Other Ambulatory Visit: Payer: Self-pay | Admitting: Cardiovascular Disease

## 2016-01-14 DIAGNOSIS — I1310 Hypertensive heart and chronic kidney disease without heart failure, with stage 1 through stage 4 chronic kidney disease, or unspecified chronic kidney disease: Secondary | ICD-10-CM | POA: Insufficient documentation

## 2016-01-14 DIAGNOSIS — E1151 Type 2 diabetes mellitus with diabetic peripheral angiopathy without gangrene: Secondary | ICD-10-CM | POA: Diagnosis not present

## 2016-01-14 DIAGNOSIS — N182 Chronic kidney disease, stage 2 (mild): Secondary | ICD-10-CM | POA: Diagnosis not present

## 2016-01-14 DIAGNOSIS — Z1389 Encounter for screening for other disorder: Secondary | ICD-10-CM | POA: Diagnosis not present

## 2016-01-14 DIAGNOSIS — Z6827 Body mass index (BMI) 27.0-27.9, adult: Secondary | ICD-10-CM | POA: Diagnosis not present

## 2016-01-14 DIAGNOSIS — E1121 Type 2 diabetes mellitus with diabetic nephropathy: Secondary | ICD-10-CM | POA: Insufficient documentation

## 2016-01-14 DIAGNOSIS — Z0000 Encounter for general adult medical examination without abnormal findings: Secondary | ICD-10-CM | POA: Diagnosis not present

## 2016-01-14 DIAGNOSIS — Z23 Encounter for immunization: Secondary | ICD-10-CM | POA: Diagnosis not present

## 2016-01-14 DIAGNOSIS — J449 Chronic obstructive pulmonary disease, unspecified: Secondary | ICD-10-CM | POA: Insufficient documentation

## 2016-01-14 DIAGNOSIS — Z952 Presence of prosthetic heart valve: Secondary | ICD-10-CM | POA: Diagnosis not present

## 2016-01-14 DIAGNOSIS — E784 Other hyperlipidemia: Secondary | ICD-10-CM | POA: Diagnosis not present

## 2016-01-14 DIAGNOSIS — Z9081 Acquired absence of spleen: Secondary | ICD-10-CM | POA: Diagnosis not present

## 2016-01-14 DIAGNOSIS — E663 Overweight: Secondary | ICD-10-CM | POA: Insufficient documentation

## 2016-01-14 DIAGNOSIS — I119 Hypertensive heart disease without heart failure: Secondary | ICD-10-CM | POA: Diagnosis not present

## 2016-01-14 NOTE — Telephone Encounter (Signed)
Rx refill sent to pharmacy. 

## 2016-01-15 DIAGNOSIS — Z1212 Encounter for screening for malignant neoplasm of rectum: Secondary | ICD-10-CM | POA: Diagnosis not present

## 2016-01-23 ENCOUNTER — Ambulatory Visit (INDEPENDENT_AMBULATORY_CARE_PROVIDER_SITE_OTHER): Payer: Medicare Other

## 2016-01-23 DIAGNOSIS — G459 Transient cerebral ischemic attack, unspecified: Secondary | ICD-10-CM | POA: Diagnosis not present

## 2016-01-23 DIAGNOSIS — Z5181 Encounter for therapeutic drug level monitoring: Secondary | ICD-10-CM | POA: Diagnosis not present

## 2016-01-23 DIAGNOSIS — I359 Nonrheumatic aortic valve disorder, unspecified: Secondary | ICD-10-CM

## 2016-01-23 LAB — POCT INR: INR: 1.6

## 2016-01-25 ENCOUNTER — Other Ambulatory Visit: Payer: Self-pay | Admitting: Cardiovascular Disease

## 2016-01-29 ENCOUNTER — Ambulatory Visit (HOSPITAL_COMMUNITY)
Admission: RE | Admit: 2016-01-29 | Discharge: 2016-01-29 | Disposition: A | Discharge status: Home / Self Care | Payer: Medicare Other | Source: Ambulatory Visit | Attending: Cardiovascular Disease | Admitting: Cardiovascular Disease

## 2016-01-29 DIAGNOSIS — I739 Peripheral vascular disease, unspecified: Secondary | ICD-10-CM | POA: Diagnosis not present

## 2016-01-29 DIAGNOSIS — E785 Hyperlipidemia, unspecified: Secondary | ICD-10-CM | POA: Diagnosis not present

## 2016-01-29 DIAGNOSIS — I10 Essential (primary) hypertension: Secondary | ICD-10-CM | POA: Insufficient documentation

## 2016-01-29 DIAGNOSIS — I6523 Occlusion and stenosis of bilateral carotid arteries: Secondary | ICD-10-CM

## 2016-01-29 DIAGNOSIS — E119 Type 2 diabetes mellitus without complications: Secondary | ICD-10-CM | POA: Diagnosis not present

## 2016-01-31 ENCOUNTER — Telehealth: Payer: Self-pay | Admitting: Cardiovascular Disease

## 2016-01-31 NOTE — Telephone Encounter (Signed)
New message      Calling to get carotid results

## 2016-01-31 NOTE — Telephone Encounter (Signed)
Called patient back about carotid results. Per Dr. Johnsie Cancel, 40-59% RICA stenosis. F/U duplex in 1 year. Patient verbalized understanding.

## 2016-02-05 DIAGNOSIS — H5213 Myopia, bilateral: Secondary | ICD-10-CM | POA: Diagnosis not present

## 2016-02-05 DIAGNOSIS — H2513 Age-related nuclear cataract, bilateral: Secondary | ICD-10-CM | POA: Diagnosis not present

## 2016-02-05 DIAGNOSIS — E119 Type 2 diabetes mellitus without complications: Secondary | ICD-10-CM | POA: Diagnosis not present

## 2016-02-05 DIAGNOSIS — H25013 Cortical age-related cataract, bilateral: Secondary | ICD-10-CM | POA: Diagnosis not present

## 2016-02-10 NOTE — Progress Notes (Signed)
Patient ID: [name] [name], male   DOB: December 03, 1946, 70 y.o.   MRN: [id number]     02/17/2016 Mannix Kroeker   1946-10-16  [id number]  Primary Physician Precious Reel, MD Primary Cardiologist: Edge Mauger/Berry    HPI:  [name] [name] is a delightful 70 y.o.  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 His primary care physician is Dr. Virgina Jock. Followed cosely by Dr Gwenlyn Found  for peripheral vascular disease Had pesistant hypertension with duplex evidence of bilateral renal artery stenosis as well as new-onset right calf claudication . He has also had aortobifemoral bypass grafting, left external iliac stenting and left common femoral endarterectomy performed by Dr. Donnetta Hutching.  Dr Gwenlyn Found  performed left renal artery stenting 02/21/15 with an excellent angiographic and clinical result. His blood pressure has been under better control.  Subsequently had staged right SFA directional atherectomy 04/22/15 removing a high-grade eccentric calcified mid to distal right SFA plaque. He did have progression of disease at the origin of his right SFA. His claudication is markedly improved.  St. Jude aVR performed by Dr. Cyndia Bent December 2004 on Coumadin anticoagulation. Had CVA  04/2013 when off coumadin in past and needs lovenox bridging  Carotids 01/29/16  Right ICA 40-59% stenosis   Wants to come off plavix Causing pruritis.  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 amLODipine (NORVASC) 10 MG tablet Take 10 mg by mouth daily.  5  . atorvastatin (LIPITOR) 40 MG tablet Take 40 mg by mouth daily.      . canagliflozin (INVOKANA) 100 MG TABS tablet Take 100 mg by mouth daily.    . clopidogrel (PLAVIX) 75 MG tablet Take 1 tablet (75 mg total) by mouth daily. 90 tablet 1  . ezetimibe (ZETIA) 10 MG tablet Take 10 mg by mouth daily.    [name] glyBURIDE (DIABETA) 2.5 MG tablet Take 2 tablets (5 mg) by mouth every morning and 1 tablet (2.5 mg) by mouth at bedtime    . hydrochlorothiazide (MICROZIDE) 12.5 MG capsule TAKE ONE CAPSULE BY MOUTH EVERY DAY 30 capsule 8  . metoprolol succinate (TOPROL-XL) 50 MG 24 hr tablet TAKE 1 TABLET BY MOUTH EVERY DAY 30 tablet 10  . sildenafil (REVATIO) 20 MG tablet Take 20 mg by mouth as directed.    . trandolapril (MAVIK) 4 MG tablet TAKE 1 TABLET BY MOUTH EVERY DAY 30 tablet 3  . warfarin (COUMADIN) 7.5 MG tablet TAKE AS DIRECTED BY COUMADIN CLINIC 40 tablet 3   No current facility-administered medications for this visit.    No Known Allergies  Social History   Social History  . Marital Status: Divorced    Spouse Name: N/A  . Number of Children: N/A  . Years of Education: N/A   Occupational History  . Not on file.   Social History Main Topics  . Smoking status: Former Smoker -- 1.50 packs/day for 30 years    Types: Cigarettes    Quit date: 12/15/1993  . Smokeless tobacco: Never Used  . Alcohol Use: 7.0 oz/week    14 Standard  drinks or equivalent per week     Comment: drinks 2 martini's a night  . Drug Use: No  . Sexual Activity: Yes   Other Topics Concern  . Not on file   Social History Narrative   Tries to remain active.  Frequent golfer but claudication limits this.     Review of Systems: General: negative for chills, fever, night sweats or weight changes.  Cardiovascular: negative for chest pain, dyspnea on exertion, edema, orthopnea, palpitations, paroxysmal nocturnal dyspnea or shortness of breath Dermatological: negative for rash Respiratory: negative for cough or wheezing Urologic: negative for hematuria Abdominal: negative for  nausea, vomiting, diarrhea, bright red blood per rectum, melena, or hematemesis Neurologic: negative for visual changes, syncope, or dizziness All other systems reviewed and are otherwise negative except as noted above.    Blood pressure 110/70, pulse 69, height '5\' 8"'$  (1.727 m), weight 80.196 kg (176 lb 12.8 oz), SpO2 95 %.  Affect appropriate Healthy:  appears stated age 70: normal Neck supple with no adenopathy JVP normal no bruits no thyromegaly Lungs clear with no wheezing and good diaphragmatic motion Heart:  X4/J2 click SEM  murmur, no rub, gallop or click PMI normal Abdomen: benighn, BS positve, no tenderness, no AAA Bilateral femoral  bruit.  No HSM or HJR Post aorto bifem  Distal pulses intact with no bruits No edema Neuro non-focal Skin warm and dry No muscular weakness   EKG  01/08/15  SR rate 68 normal   02/17/16  SR rate 65  Normal   ASSESSMENT AND PLAN:   AVR:  Normal exam  Continue SBE and anticoagulation Last echo 2014 normal AVR and EF 60-65% f/u echo  Anticoagulation: needs lovenox bridging if coumadin stopped PVD:  F/u Dr Gwenlyn Found.  Claudication improved will ask him if ok to stop plavix and resume ASA if not will start Effient  Carotids:  F/u duplex 01/2017  40-59% RICA stenosis  HTN/RAS:  Post left renal artery  stenting improved  Optho:  Having cataract surgery with Dr Satira Sark will not stop coumadin    Jenkins Rouge

## 2016-02-11 DIAGNOSIS — H1851 Endothelial corneal dystrophy: Secondary | ICD-10-CM | POA: Diagnosis not present

## 2016-02-11 DIAGNOSIS — H25013 Cortical age-related cataract, bilateral: Secondary | ICD-10-CM | POA: Diagnosis not present

## 2016-02-11 DIAGNOSIS — H43813 Vitreous degeneration, bilateral: Secondary | ICD-10-CM | POA: Diagnosis not present

## 2016-02-11 DIAGNOSIS — H2513 Age-related nuclear cataract, bilateral: Secondary | ICD-10-CM | POA: Diagnosis not present

## 2016-02-14 ENCOUNTER — Encounter: Payer: Self-pay | Admitting: Cardiovascular Disease

## 2016-02-17 ENCOUNTER — Encounter: Payer: Self-pay | Admitting: Cardiovascular Disease

## 2016-02-17 ENCOUNTER — Ambulatory Visit (INDEPENDENT_AMBULATORY_CARE_PROVIDER_SITE_OTHER): Payer: Medicare Other | Admitting: Cardiovascular Disease

## 2016-02-17 ENCOUNTER — Ambulatory Visit (INDEPENDENT_AMBULATORY_CARE_PROVIDER_SITE_OTHER): Payer: Medicare Other | Admitting: *Deleted

## 2016-02-17 VITALS — BP 110/70 | HR 69 | Ht 68.0 in | Wt 176.8 lb

## 2016-02-17 DIAGNOSIS — G459 Transient cerebral ischemic attack, unspecified: Secondary | ICD-10-CM | POA: Diagnosis not present

## 2016-02-17 DIAGNOSIS — I6523 Occlusion and stenosis of bilateral carotid arteries: Secondary | ICD-10-CM | POA: Diagnosis not present

## 2016-02-17 DIAGNOSIS — Z5181 Encounter for therapeutic drug level monitoring: Secondary | ICD-10-CM | POA: Diagnosis not present

## 2016-02-17 DIAGNOSIS — I359 Nonrheumatic aortic valve disorder, unspecified: Secondary | ICD-10-CM

## 2016-02-17 LAB — POCT INR: INR: 3.3

## 2016-02-17 NOTE — Progress Notes (Signed)
It's been a year since procedure. OK to stop plavix  JJB

## 2016-02-17 NOTE — Patient Instructions (Addendum)
Medication Instructions:  Your physician recommends that you continue on your current medications as directed. Please refer to the Current Medication list given to you today.  Labwork: NONE  Testing/Procedures: Your physician has requested that you have an echocardiogram. Echocardiography is a painless test that uses sound waves to create images of your heart. It provides your doctor with information about the size and shape of your heart and how well your heart's chambers and valves are working. This procedure takes approximately one hour. There are no restrictions for this procedure.  Follow-Up: Your physician wants you to follow-up in: 6 months with Dr. Johnsie Cancel. You will receive a reminder letter in the mail two months in advance. If you don't receive a letter, please call our office to schedule the follow-up appointment.   If you need a refill on your cardiac medications before your next appointment, please call your pharmacy.

## 2016-02-27 DIAGNOSIS — H25811 Combined forms of age-related cataract, right eye: Secondary | ICD-10-CM | POA: Diagnosis not present

## 2016-02-27 DIAGNOSIS — H25011 Cortical age-related cataract, right eye: Secondary | ICD-10-CM | POA: Diagnosis not present

## 2016-02-27 DIAGNOSIS — H2511 Age-related nuclear cataract, right eye: Secondary | ICD-10-CM | POA: Diagnosis not present

## 2016-03-11 ENCOUNTER — Other Ambulatory Visit (HOSPITAL_COMMUNITY): Payer: Medicare Other

## 2016-03-13 ENCOUNTER — Telehealth: Payer: Self-pay

## 2016-03-13 ENCOUNTER — Other Ambulatory Visit: Payer: Self-pay | Admitting: Physician Assistant

## 2016-03-13 NOTE — Telephone Encounter (Signed)
Left message for patient to call back.  Per Dr. Johnsie Cancel, let patient know Dr Gwenlyn Found did not think plavix is needed any more, due to stents are old. He is on coumadin for valve, so increased risk of bleeding also. That it caused pruritis.

## 2016-03-13 NOTE — Telephone Encounter (Signed)
Please review for refill. Thanks!  

## 2016-03-19 NOTE — Telephone Encounter (Signed)
Left second message to call back.

## 2016-03-20 NOTE — Telephone Encounter (Signed)
Patient called back. Informed patient, per Dr. Johnsie Cancel, that Dr. Gwenlyn Found did not think he needed to be on Plavix any more, due to stents are old, increased risk for bleeding, and that it caused pruritis. Patient will continue his coumadin. Patient verbalized understanding and will stop taking Plavix. Plavix has been removed from patient's medication list.

## 2016-03-26 DIAGNOSIS — H2512 Age-related nuclear cataract, left eye: Secondary | ICD-10-CM | POA: Diagnosis not present

## 2016-03-26 DIAGNOSIS — H25012 Cortical age-related cataract, left eye: Secondary | ICD-10-CM | POA: Diagnosis not present

## 2016-03-26 DIAGNOSIS — H25812 Combined forms of age-related cataract, left eye: Secondary | ICD-10-CM | POA: Diagnosis not present

## 2016-04-01 ENCOUNTER — Ambulatory Visit (INDEPENDENT_AMBULATORY_CARE_PROVIDER_SITE_OTHER): Payer: Medicare Other | Admitting: *Deleted

## 2016-04-01 ENCOUNTER — Ambulatory Visit (HOSPITAL_COMMUNITY): Payer: Medicare Other | Attending: Cardiovascular Disease

## 2016-04-01 ENCOUNTER — Other Ambulatory Visit: Payer: Self-pay

## 2016-04-01 DIAGNOSIS — G459 Transient cerebral ischemic attack, unspecified: Secondary | ICD-10-CM | POA: Diagnosis not present

## 2016-04-01 DIAGNOSIS — I359 Nonrheumatic aortic valve disorder, unspecified: Secondary | ICD-10-CM

## 2016-04-01 DIAGNOSIS — Z8249 Family history of ischemic heart disease and other diseases of the circulatory system: Secondary | ICD-10-CM | POA: Diagnosis not present

## 2016-04-01 DIAGNOSIS — Z951 Presence of aortocoronary bypass graft: Secondary | ICD-10-CM | POA: Insufficient documentation

## 2016-04-01 DIAGNOSIS — I2510 Atherosclerotic heart disease of native coronary artery without angina pectoris: Secondary | ICD-10-CM | POA: Insufficient documentation

## 2016-04-01 DIAGNOSIS — E119 Type 2 diabetes mellitus without complications: Secondary | ICD-10-CM | POA: Diagnosis not present

## 2016-04-01 DIAGNOSIS — I059 Rheumatic mitral valve disease, unspecified: Secondary | ICD-10-CM | POA: Insufficient documentation

## 2016-04-01 DIAGNOSIS — Z5181 Encounter for therapeutic drug level monitoring: Secondary | ICD-10-CM | POA: Diagnosis not present

## 2016-04-01 DIAGNOSIS — E785 Hyperlipidemia, unspecified: Secondary | ICD-10-CM | POA: Diagnosis not present

## 2016-04-01 DIAGNOSIS — I119 Hypertensive heart disease without heart failure: Secondary | ICD-10-CM | POA: Diagnosis not present

## 2016-04-01 DIAGNOSIS — Z952 Presence of prosthetic heart valve: Secondary | ICD-10-CM | POA: Insufficient documentation

## 2016-04-01 LAB — POCT INR: INR: 4.6

## 2016-04-15 ENCOUNTER — Ambulatory Visit (INDEPENDENT_AMBULATORY_CARE_PROVIDER_SITE_OTHER): Payer: Medicare Other | Admitting: *Deleted

## 2016-04-15 DIAGNOSIS — G459 Transient cerebral ischemic attack, unspecified: Secondary | ICD-10-CM | POA: Diagnosis not present

## 2016-04-15 DIAGNOSIS — I359 Nonrheumatic aortic valve disorder, unspecified: Secondary | ICD-10-CM | POA: Diagnosis not present

## 2016-04-15 DIAGNOSIS — Z5181 Encounter for therapeutic drug level monitoring: Secondary | ICD-10-CM

## 2016-04-15 LAB — POCT INR: INR: 2.7

## 2016-05-09 ENCOUNTER — Other Ambulatory Visit: Payer: Self-pay | Admitting: Cardiovascular Disease

## 2016-05-11 NOTE — Telephone Encounter (Signed)
Rx(s) sent to pharmacy electronically.  

## 2016-06-08 ENCOUNTER — Other Ambulatory Visit: Payer: Self-pay | Admitting: Cardiovascular Disease

## 2016-06-11 ENCOUNTER — Other Ambulatory Visit: Payer: Self-pay | Admitting: Cardiovascular Disease

## 2016-06-11 ENCOUNTER — Ambulatory Visit (INDEPENDENT_AMBULATORY_CARE_PROVIDER_SITE_OTHER): Payer: Medicare Other | Admitting: *Deleted

## 2016-06-11 DIAGNOSIS — I359 Nonrheumatic aortic valve disorder, unspecified: Secondary | ICD-10-CM | POA: Diagnosis not present

## 2016-06-11 DIAGNOSIS — G459 Transient cerebral ischemic attack, unspecified: Secondary | ICD-10-CM

## 2016-06-11 DIAGNOSIS — Z5181 Encounter for therapeutic drug level monitoring: Secondary | ICD-10-CM

## 2016-06-11 LAB — POCT INR: INR: 4.2

## 2016-06-24 ENCOUNTER — Ambulatory Visit (INDEPENDENT_AMBULATORY_CARE_PROVIDER_SITE_OTHER): Payer: Medicare Other | Admitting: Pharmacist

## 2016-06-24 DIAGNOSIS — Z5181 Encounter for therapeutic drug level monitoring: Secondary | ICD-10-CM | POA: Diagnosis not present

## 2016-06-24 DIAGNOSIS — I359 Nonrheumatic aortic valve disorder, unspecified: Secondary | ICD-10-CM | POA: Diagnosis not present

## 2016-06-24 DIAGNOSIS — G459 Transient cerebral ischemic attack, unspecified: Secondary | ICD-10-CM

## 2016-06-24 LAB — POCT INR: INR: 3.2

## 2016-06-25 ENCOUNTER — Other Ambulatory Visit: Payer: Self-pay | Admitting: *Deleted

## 2016-06-25 MED ORDER — METOPROLOL SUCCINATE ER 50 MG PO TB24: 50.0000 mg | ORAL_TABLET | Freq: Every day | ORAL | Status: DC

## 2016-06-30 ENCOUNTER — Encounter: Payer: Self-pay | Admitting: Cardiovascular Disease

## 2016-06-30 ENCOUNTER — Ambulatory Visit (INDEPENDENT_AMBULATORY_CARE_PROVIDER_SITE_OTHER): Payer: Medicare Other | Admitting: Cardiovascular Disease

## 2016-06-30 VITALS — BP 140/59 | HR 67 | Ht 68.0 in | Wt 173.0 lb

## 2016-06-30 DIAGNOSIS — I701 Atherosclerosis of renal artery: Secondary | ICD-10-CM | POA: Diagnosis not present

## 2016-06-30 DIAGNOSIS — I739 Peripheral vascular disease, unspecified: Secondary | ICD-10-CM

## 2016-06-30 NOTE — Progress Notes (Signed)
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 He retired a little over 2 years ago having worked there for 25 years. His primary care physician is Dr. Virgina Jock. He was referred by Dr. Johnsie Cancel for peripheral vascular evaluation because of resistant hypertension with duplex evidence of bilateral renal artery stenosis as well as new-onset right calf claudication with recent Dopplers that showed a lesion in his mid right SFA. He does have a history of remote tobacco abuse as well as treated hypertension, hyperlipidemia and diabetes. He has had coronary bypass grafting and St. Jude aVR performed by Dr. Cyndia Bent December 2004 on Coumadin anticoagulation. He has also had aortobifemoral bypass grafting, left external iliac stenting and left common femoral endarterectomy performed by Dr. Donnetta Hutching. I performed left renal artery stenting 02/21/15 with an excellent angiographic and clinical result. His blood pressure has been under better control. I then perform staged right SFA directional atherectomy 04/22/15 removing a high-grade eccentric calcified mid to distal right SFA plaque. He did have progression of disease at the origin of his right SFA. His claudication markedly improved after his procedure however since I saw on the year ago he's had some recurrent claudication. His blood pressures have been under better control since his renal artery intervention.   Current Outpatient Prescriptions  Medication Sig Dispense Refill  . amLODipine (NORVASC) 10 MG tablet Take 10 mg by mouth daily.  5  . atorvastatin (LIPITOR) 40 MG tablet Take 40 mg by  mouth daily.     . canagliflozin (INVOKANA) 100 MG TABS tablet Take 100 mg by mouth daily.    [name] ezetimibe (ZETIA) 10 MG tablet Take 10 mg by mouth daily.    [name] glyBURIDE (DIABETA) 2.5 MG tablet Take 2 tablets (5 mg) by mouth every morning and 1 tablet (2.5 mg) by mouth at bedtime    . hydrochlorothiazide (MICROZIDE) 12.5 MG capsule TAKE ONE CAPSULE BY MOUTH EVERY DAY 30 capsule 2  . metoprolol succinate (TOPROL-XL) 50 MG 24 hr tablet Take 1 tablet (50 mg total) by mouth daily. Take with or immediately following a meal. 30 tablet 6  . sildenafil (REVATIO) 20 MG tablet Take 20 mg by mouth as directed.    . warfarin (COUMADIN) 7.5 MG tablet TAKE AS DIRECTED BY COUMADIN CLINIC 40 tablet 3   No current facility-administered medications for this visit.     No Known Allergies  Social History   Social History  . Marital status: Divorced    Spouse name: N/A  . Number of children: N/A  . Years of education: N/A   Occupational History  . Not on file.   Social History Main Topics  . Smoking status: Former Smoker    Packs/day: 1.50    Years: 30.00    Types: Cigarettes    Quit date: 12/15/1993  . Smokeless tobacco: Never Used  . Alcohol use 7.0 oz/week    14 Standard drinks or equivalent per week     Comment: drinks 2 martini's a night  . Drug use: No  . Sexual activity: Yes   Other Topics Concern  . Not on file   Social  History Narrative   Tries to remain active.  Frequent golfer but claudication limits this.     Review of Systems: General: negative for chills, fever, night sweats or weight changes.  Cardiovascular: negative for chest pain, dyspnea on exertion, edema, orthopnea, palpitations, paroxysmal nocturnal dyspnea or shortness of breath Dermatological: negative for rash Respiratory: negative for cough or wheezing Urologic: negative for hematuria Abdominal: negative for nausea, vomiting, diarrhea, bright red blood per rectum, melena, or hematemesis Neurologic: negative for  visual changes, syncope, or dizziness All other systems reviewed and are otherwise negative except as noted above.    Blood pressure (!) 140/59, pulse 67, height '5\' 8"'$  (1.727 m), weight 173 lb (78.5 kg).  General appearance: alert and no distress Neck: no adenopathy, no carotid bruit, no JVD, supple, symmetrical, trachea midline and thyroid not enlarged, symmetric, no tenderness/mass/nodules Lungs: clear to auscultation bilaterally Heart: Crisp prosthetic aortic valve sounds Extremities: Crisp prosthetic aortic valve sounds  EKG not performed today  ASSESSMENT AND PLAN:   Essential hypertension History of hypertension blood pressure measured 140/59. He is on amlodipine, hydrochlorothiazide and metoprolol. He is status post left renal artery stenting by myself 02/21/15 with excellent angiographic and clinical result. We will recheck renal Doppler studies  PVD (peripheral vascular disease) History of peripheral arterial disease status post remote aortobifemoral bypass grafting performed by Dr. Donnetta Hutching as well as left external iliac stenting and left common femoral endarterectomy. I performed [facility] 1 his most recent lower extremity arterial Doppler studies performed 11/13/15 revealed a widely patent atherectomy site. He does complain of some continued right lower extremity claudication and apparently there was some residual disease. We will recheck lower extremity arterial Doppler studies. directional atherectomy and drug-eluting balloon angioplasty on him 04/22/15 with excellent angiographic and clinical result.      Lorretta Harp MD FACP,FACC,FAHA, [facility] 06/30/2016 8:21 AM

## 2016-06-30 NOTE — Patient Instructions (Signed)
Medication Instructions:  Your physician recommends that you continue on your current medications as directed. Please refer to the Current Medication list given to you today.   Testing/Procedures: Your physician has requested that you have a lower extremity arterial doppler- During this test, ultrasound is used to evaluate arterial blood flow in the legs. Allow approximately one hour for this exam.   Your physician has requested that you have a renal artery duplex. During this test, an ultrasound is used to evaluate blood flow to the kidneys. Allow one hour for this exam. Do not eat after midnight the day before and avoid carbonated beverages. Take your medications as you usually do.   Follow-Up: Your physician wants you to follow-up in: [place]. You will receive a reminder letter in the mail two months in advance. If you don't receive a letter, please call our office to schedule the follow-up appointment.   If you need a refill on your cardiac medications before your next appointment, please call your pharmacy.

## 2016-06-30 NOTE — Assessment & Plan Note (Signed)
History of hypertension blood pressure measured 140/59. He is on amlodipine, hydrochlorothiazide and metoprolol. He is status post left renal artery stenting by myself 02/21/15 with excellent angiographic and clinical result. We will recheck renal Doppler studies

## 2016-06-30 NOTE — Assessment & Plan Note (Signed)
History of peripheral arterial disease status post remote aortobifemoral bypass grafting performed by Dr. Donnetta Hutching as well as left external iliac stenting and left common femoral endarterectomy. I performed [facility] 1 his most recent lower extremity arterial Doppler studies performed 11/13/15 revealed a widely patent atherectomy site. He does complain of some continued right lower extremity claudication and apparently there was some residual disease. We will recheck lower extremity arterial Doppler studies. directional atherectomy and drug-eluting balloon angioplasty on him 04/22/15 with excellent angiographic and clinical result.

## 2016-07-01 ENCOUNTER — Encounter: Payer: Self-pay | Admitting: Internal Medicine

## 2016-07-01 NOTE — Progress Notes (Signed)
Opened in error

## 2016-07-02 ENCOUNTER — Other Ambulatory Visit: Payer: Self-pay | Admitting: Cardiovascular Disease

## 2016-07-02 DIAGNOSIS — I739 Peripheral vascular disease, unspecified: Secondary | ICD-10-CM

## 2016-07-15 ENCOUNTER — Encounter (INDEPENDENT_AMBULATORY_CARE_PROVIDER_SITE_OTHER): Payer: Self-pay

## 2016-07-15 ENCOUNTER — Ambulatory Visit (HOSPITAL_COMMUNITY)
Admission: RE | Admit: 2016-07-15 | Discharge: 2016-07-15 | Disposition: A | Discharge status: Home / Self Care | Payer: Medicare Other | Source: Ambulatory Visit | Attending: Cardiology | Admitting: Cardiology

## 2016-07-15 DIAGNOSIS — E785 Hyperlipidemia, unspecified: Secondary | ICD-10-CM | POA: Insufficient documentation

## 2016-07-15 DIAGNOSIS — I701 Atherosclerosis of renal artery: Secondary | ICD-10-CM

## 2016-07-15 DIAGNOSIS — I70201 Unspecified atherosclerosis of native arteries of extremities, right leg: Secondary | ICD-10-CM | POA: Diagnosis not present

## 2016-07-15 DIAGNOSIS — I10 Essential (primary) hypertension: Secondary | ICD-10-CM | POA: Diagnosis not present

## 2016-07-15 DIAGNOSIS — R938 Abnormal findings on diagnostic imaging of other specified body structures: Secondary | ICD-10-CM | POA: Insufficient documentation

## 2016-07-15 DIAGNOSIS — E119 Type 2 diabetes mellitus without complications: Secondary | ICD-10-CM | POA: Diagnosis not present

## 2016-07-15 DIAGNOSIS — I2510 Atherosclerotic heart disease of native coronary artery without angina pectoris: Secondary | ICD-10-CM | POA: Insufficient documentation

## 2016-07-15 DIAGNOSIS — I739 Peripheral vascular disease, unspecified: Secondary | ICD-10-CM | POA: Diagnosis not present

## 2016-07-22 ENCOUNTER — Ambulatory Visit (INDEPENDENT_AMBULATORY_CARE_PROVIDER_SITE_OTHER): Payer: Medicare Other | Admitting: *Deleted

## 2016-07-22 ENCOUNTER — Ambulatory Visit (INDEPENDENT_AMBULATORY_CARE_PROVIDER_SITE_OTHER): Payer: Medicare Other | Admitting: Cardiovascular Disease

## 2016-07-22 ENCOUNTER — Other Ambulatory Visit: Payer: Self-pay | Admitting: *Deleted

## 2016-07-22 ENCOUNTER — Encounter (INDEPENDENT_AMBULATORY_CARE_PROVIDER_SITE_OTHER): Payer: Self-pay

## 2016-07-22 ENCOUNTER — Encounter: Payer: Self-pay | Admitting: Cardiovascular Disease

## 2016-07-22 ENCOUNTER — Telehealth: Payer: Self-pay | Admitting: Pharmacist

## 2016-07-22 VITALS — BP 157/68 | HR 81 | Ht 68.0 in | Wt 174.0 lb

## 2016-07-22 DIAGNOSIS — I359 Nonrheumatic aortic valve disorder, unspecified: Secondary | ICD-10-CM | POA: Diagnosis not present

## 2016-07-22 DIAGNOSIS — Z01818 Encounter for other preprocedural examination: Secondary | ICD-10-CM | POA: Diagnosis not present

## 2016-07-22 DIAGNOSIS — Z79899 Other long term (current) drug therapy: Secondary | ICD-10-CM | POA: Diagnosis not present

## 2016-07-22 DIAGNOSIS — I739 Peripheral vascular disease, unspecified: Secondary | ICD-10-CM

## 2016-07-22 DIAGNOSIS — I701 Atherosclerosis of renal artery: Secondary | ICD-10-CM

## 2016-07-22 DIAGNOSIS — D689 Coagulation defect, unspecified: Secondary | ICD-10-CM | POA: Diagnosis not present

## 2016-07-22 DIAGNOSIS — G459 Transient cerebral ischemic attack, unspecified: Secondary | ICD-10-CM | POA: Diagnosis not present

## 2016-07-22 DIAGNOSIS — Z5181 Encounter for therapeutic drug level monitoring: Secondary | ICD-10-CM

## 2016-07-22 LAB — POCT INR: INR: 2

## 2016-07-22 NOTE — Progress Notes (Signed)
07/22/2016 Denarius Sesler   24-Jun-1946  [id number]  Primary Physician Precious Reel, MD Primary Cardiologist: Lorretta Harp MD Renae Gloss  HPI:   Mr. 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 I last saw him in the office 06/30/16. He retired a little over 2 years ago having worked there for 25 years. His primary care physician is Dr. Virgina Jock. He was referred by Dr. Johnsie Cancel for peripheral vascular evaluation because of resistant hypertension with duplex evidence of bilateral renal artery stenosis as well as new-onset right calf claudication with recent Dopplers that showed a lesion in his mid right SFA. He does have a history of remote tobacco abuse as well as treated hypertension, hyperlipidemia and diabetes. He has had coronary bypass grafting and St. Jude aVR performed by Dr. Cyndia Bent December 2004 on Coumadin anticoagulation. He has also had aortobifemoral bypass grafting, left external iliac stenting and left common femoral endarterectomy performed by Dr. Donnetta Hutching. I performed left renal artery stenting 02/21/15 with an excellent angiographic and clinical result. His blood pressure has been under better control. I then perform staged right SFA directional atherectomy 04/22/15 removing a high-grade eccentric calcified mid to distal right SFA plaque. He did have progression of disease at the origin of his right SFA. His claudication markedly improved after his procedure however since I saw on the year ago he's had some recurrent claudication. His blood pressures have been under better control since his renal artery intervention. Recent lower extremity Doppler studies performed 07/15/16 revealed a right ABI 0.34 with a new high-frequency signal in the origin of his right SFA. We will proceed with angiography and potential  intervention with Lovenox bridging.   Current Outpatient Prescriptions  Medication Sig Dispense Refill  . amLODipine (NORVASC) 10 MG tablet Take 10 mg by mouth daily.  5  . atorvastatin (LIPITOR) 40 MG tablet Take 40 mg by mouth daily.     . canagliflozin (INVOKANA) 100 MG TABS tablet Take 100 mg by mouth daily.    [name] ezetimibe (ZETIA) 10 MG tablet Take 10 mg by mouth daily.    [name] glyBURIDE (DIABETA) 2.5 MG tablet Take 2 tablets (5 mg) by mouth every morning and 1 tablet (2.5 mg) by mouth at bedtime    . hydrochlorothiazide (MICROZIDE) 12.5 MG capsule TAKE ONE CAPSULE BY MOUTH EVERY DAY 30 capsule 2  . metoprolol succinate (TOPROL-XL) 50 MG 24 hr tablet Take 1 tablet (50 mg total) by mouth daily. Take with or immediately following a meal. 30 tablet 6  . sildenafil (REVATIO) 20 MG tablet Take 20 mg by mouth as directed.    . warfarin (COUMADIN) 7.5 MG tablet TAKE AS DIRECTED BY COUMADIN CLINIC 40 tablet 3   No current facility-administered medications for this visit.     No Known Allergies  Social History   Social History  . Marital status: Divorced    Spouse name: N/A  . Number of children: N/A  . Years of education: N/A   Occupational History  . Not on file.   Social History Main Topics  . Smoking status: Former Smoker    Packs/day: 1.50    Years: 30.00    Types: Cigarettes    Quit date: 12/15/1993  . Smokeless tobacco: Never Used  . Alcohol use 7.0 oz/week    14 Standard drinks or  equivalent per week     Comment: drinks 2 martini's a night  . Drug use: No  . Sexual activity: Yes   Other Topics Concern  . Not on file   Social History Narrative   Tries to remain active.  Frequent golfer but claudication limits this.     Review of Systems: General: negative for chills, fever, night sweats or weight changes.  Cardiovascular: negative for chest pain, dyspnea on exertion, edema, orthopnea, palpitations, paroxysmal nocturnal dyspnea or shortness of breath Dermatological:  negative for rash Respiratory: negative for cough or wheezing Urologic: negative for hematuria Abdominal: negative for nausea, vomiting, diarrhea, bright red blood per rectum, melena, or hematemesis Neurologic: negative for visual changes, syncope, or dizziness All other systems reviewed and are otherwise negative except as noted above.    Blood pressure (!) 157/68, pulse 81, height '5\' 8"'$  (1.727 m), weight 174 lb (78.9 kg).  General appearance: alert and no distress Neck: no adenopathy, no JVD, supple, symmetrical, trachea midline, thyroid not enlarged, symmetric, no tenderness/mass/nodules and Bilateral carotid bruits versus transmitted murmur Lungs: clear to auscultation bilaterally Heart: Crisp prosthetic valve sounds Extremities: extremities normal, atraumatic, no cyanosis or edema and 2+ right femoral without bruit  EKG not performed today  ASSESSMENT AND PLAN:   Peripheral vascular disease Mr. Hollars returns today for follow-up of his Doppler studies. Of his mid right SFA. Antegrade stick in May of last year which resulted in marked improvement in his claudication however, over the last several months his claudication has recurred and is Dopplers reveal a decline in his ABI 0.34 with a new high-frequency signal in his proximal left is status post remote aortobifemoral bypass grafting as well as left external iliac artery stenting and left common femoral endarterectomy by Dr. early. I stented his left renal artery 02/21/15 with an excellent angiographic, clinical and ultrasound results. I ultimately performed directional atherectomy in May of last year of his mid right SFA with antegrade stick. His claudication improved after that however left several months he's had recurrence of his right  lower extremity claudication with recent Dopplers that showed decline in his right ABI 0.3 for any new high-frequency signal in the origin of his right SFA. We will plan on performing relook  angiography and potential intervention with a Lovenox bridge.      Lorretta Harp MD FACP,FACC,FAHA, [facility] 07/22/2016 11:35 AM

## 2016-07-22 NOTE — Telephone Encounter (Signed)
Pt has upcoming angiogram with Dr. Gwenlyn Found on 08/24/16 and will need to hold Coumadin x5 days. Pt has hx of St Jude AVR, afib, and CVA. Will need Lovenox bridging - pt has used Lovenox multiple times in the past. Will coordinate Lovenox bridging in clinic at next Coumadin check.

## 2016-07-22 NOTE — Patient Instructions (Signed)
Medication Instructions:  Your physician recommends that you continue on your current medications as directed. Please refer to the Current Medication list given to you today.   Testing/Procedures: Dr. Gwenlyn Found has ordered a peripheral angiogram to be done at [facility].  This procedure is going to look at the bloodflow in your lower extremities.  If Dr. Gwenlyn Found is able to open up the arteries, you will have to spend one night in the hospital.  If he is not able to open the arteries, you will be able to go home that same day.    After the procedure, you will not be allowed to drive for 3 days or push, pull, or lift anything greater than 10 lbs for one week.    You will be required to have the following tests prior to the procedure:  1. Blood work-the blood work can be done no more than 14 days prior to the procedure.  It can be done at any [facility] lab.  There is one downstairs on the first floor of this building and one in the [facility] building [phone] [address])  2. Chest Xray-the chest xray order has already been placed at the [place].     *REPS   SCOTT  Puncture site LEFT GROIN  Follow-Up: You have been referred to PHARMACY TO HELP BRIDGE YOUR COUMADIN FOR YOUR PROCEDURE.  If you need a refill on your cardiac medications before your next appointment, please call your pharmacy.

## 2016-07-22 NOTE — Assessment & Plan Note (Signed)
[name] [name] returns today for follow-up of his Doppler studies. Of his mid right SFA. Antegrade stick in May of last year which resulted in marked improvement in his claudication however, over the last several months his claudication has recurred and is Dopplers reveal a decline in his ABI 0.34 with a new high-frequency signal in his proximal left is status post remote aortobifemoral bypass grafting as well as left external iliac artery stenting and left common femoral endarterectomy by Dr. early. I stented his left renal artery 02/21/15 with an excellent angiographic, clinical and ultrasound results. I ultimately performed directional atherectomy in May of last year of his mid right SFA with antegrade stick. His claudication improved after that however left several months he's had recurrence of his right  lower extremity claudication with recent Dopplers that showed decline in his right ABI 0.3 for any new high-frequency signal in the origin of his right SFA. We will plan on performing relook angiography and potential intervention with a Lovenox bridge.

## 2016-08-06 ENCOUNTER — Ambulatory Visit
Admission: RE | Admit: 2016-08-06 | Discharge: 2016-08-06 | Disposition: A | Discharge status: Home / Self Care | Payer: Medicare Other | Source: Ambulatory Visit | Attending: Cardiovascular Disease | Admitting: Cardiovascular Disease

## 2016-08-06 DIAGNOSIS — D689 Coagulation defect, unspecified: Secondary | ICD-10-CM

## 2016-08-06 DIAGNOSIS — Z79899 Other long term (current) drug therapy: Secondary | ICD-10-CM

## 2016-08-06 DIAGNOSIS — I739 Peripheral vascular disease, unspecified: Secondary | ICD-10-CM

## 2016-08-06 DIAGNOSIS — Z01818 Encounter for other preprocedural examination: Secondary | ICD-10-CM

## 2016-08-06 DIAGNOSIS — J9811 Atelectasis: Secondary | ICD-10-CM | POA: Diagnosis not present

## 2016-08-12 ENCOUNTER — Ambulatory Visit (INDEPENDENT_AMBULATORY_CARE_PROVIDER_SITE_OTHER): Payer: Medicare Other | Admitting: *Deleted

## 2016-08-12 DIAGNOSIS — Z5181 Encounter for therapeutic drug level monitoring: Secondary | ICD-10-CM | POA: Diagnosis not present

## 2016-08-12 DIAGNOSIS — G459 Transient cerebral ischemic attack, unspecified: Secondary | ICD-10-CM | POA: Diagnosis not present

## 2016-08-12 DIAGNOSIS — I359 Nonrheumatic aortic valve disorder, unspecified: Secondary | ICD-10-CM

## 2016-08-12 LAB — POCT INR: INR: 4.1

## 2016-08-12 MED ORDER — ENOXAPARIN SODIUM 80 MG/0.8ML ~~LOC~~ SOLN: 80.0000 mg | Freq: Two times a day (BID) | SUBCUTANEOUS | 0 refills | Status: DC

## 2016-08-12 NOTE — Patient Instructions (Addendum)
08/18/16- Last dose of Coumadin.  08/19/16- No Coumadin or Lovenox.  08/20/16 - Inject Lovenox 80 mg in the fatty abdominal tissue at least 2 inches from the belly button twice a day about 12 hours apart, 8am and 8pm rotate sites. No Coumadin.  08/21/16- Inject Lovenox in the fatty tissue every 12 hours, 8am and 8pm. No Coumadin.  08/22/16- Inject Lovenox in the fatty tissue every 12 hours, 8am and 8pm. No Coumadin.  08/23/16- Inject Lovenox in the fatty tissue in the morning at 8 am (No PM dose). No Coumadin.  08/24/16-  Procedure Day - No Lovenox - Resume Coumadin in the evening or as directed by doctor (take an extra half tablet with usual dose for 2 days then resume normal dose).  08/25/16-Resume Lovenox inject in the fatty tissue every 12 hours and take Coumadin.  08/26/16- Inject Lovenox in the fatty tissue every 12 hours and take Coumadin.  08/27/16- Inject Lovenox in the fatty tissue every 12 hours and take Coumadin.  08/28/16- Inject Lovenox in the fatty tissue at 8am and take Coumadin.  Coumadin appt to check INR.

## 2016-08-14 DIAGNOSIS — E1122 Type 2 diabetes mellitus with diabetic chronic kidney disease: Secondary | ICD-10-CM | POA: Diagnosis not present

## 2016-08-14 DIAGNOSIS — R10817 Generalized abdominal tenderness: Secondary | ICD-10-CM | POA: Diagnosis not present

## 2016-08-14 DIAGNOSIS — I10 Essential (primary) hypertension: Secondary | ICD-10-CM | POA: Diagnosis not present

## 2016-08-14 DIAGNOSIS — I739 Peripheral vascular disease, unspecified: Secondary | ICD-10-CM | POA: Diagnosis not present

## 2016-08-14 DIAGNOSIS — Z6826 Body mass index (BMI) 26.0-26.9, adult: Secondary | ICD-10-CM | POA: Diagnosis not present

## 2016-08-14 DIAGNOSIS — Z1212 Encounter for screening for malignant neoplasm of rectum: Secondary | ICD-10-CM | POA: Diagnosis not present

## 2016-08-17 DIAGNOSIS — D689 Coagulation defect, unspecified: Secondary | ICD-10-CM | POA: Diagnosis not present

## 2016-08-17 DIAGNOSIS — Z79899 Other long term (current) drug therapy: Secondary | ICD-10-CM | POA: Diagnosis not present

## 2016-08-17 DIAGNOSIS — I739 Peripheral vascular disease, unspecified: Secondary | ICD-10-CM | POA: Diagnosis not present

## 2016-08-17 DIAGNOSIS — Z01818 Encounter for other preprocedural examination: Secondary | ICD-10-CM | POA: Diagnosis not present

## 2016-08-17 LAB — CBC WITH DIFFERENTIAL/PLATELET
BASOS ABS: 0 {cells}/uL (ref 0–200)
Basophils Relative: 0 %
Eosinophils Absolute: 71 cells/uL (ref 15–500)
Eosinophils Relative: 1 %
HEMATOCRIT: 48.7 % (ref 38.5–50.0)
HEMOGLOBIN: 16.4 g/dL (ref 13.2–17.1)
LYMPHS ABS: 1207 {cells}/uL (ref 850–3900)
Lymphocytes Relative: 17 %
MCH: 32.3 pg (ref 27.0–33.0)
MCHC: 33.7 g/dL (ref 32.0–36.0)
MCV: 96.1 fL (ref 80.0–100.0)
MONO ABS: 710 {cells}/uL (ref 200–950)
MPV: 11.8 fL (ref 7.5–12.5)
Monocytes Relative: 10 %
NEUTROS PCT: 72 %
Neutro Abs: 5112 cells/uL (ref 1500–7800)
Platelets: 221 10*3/uL (ref 140–400)
RBC: 5.07 MIL/uL (ref 4.20–5.80)
RDW: 13.6 % (ref 11.0–15.0)
WBC: 7.1 10*3/uL (ref 3.8–10.8)

## 2016-08-17 LAB — BASIC METABOLIC PANEL
BUN: 18 mg/dL (ref 7–25)
CALCIUM: 10.1 mg/dL (ref 8.6–10.3)
CO2: 28 mmol/L (ref 20–31)
Chloride: 102 mmol/L (ref 98–110)
Creat: 0.83 mg/dL (ref 0.70–1.18)
GLUCOSE: 164 mg/dL — AB (ref 65–99)
Potassium: 4.6 mmol/L (ref 3.5–5.3)
Sodium: 140 mmol/L (ref 135–146)

## 2016-08-17 LAB — PROTIME-INR
INR: 2.1 — ABNORMAL HIGH
Prothrombin Time: 21.9 s — ABNORMAL HIGH (ref 9.0–11.5)

## 2016-08-17 LAB — APTT: APTT: 34 s (ref 22–34)

## 2016-08-18 LAB — TSH: TSH: 1.79 m[IU]/L (ref 0.40–4.50)

## 2016-08-19 ENCOUNTER — Other Ambulatory Visit: Payer: Self-pay | Admitting: *Deleted

## 2016-08-19 ENCOUNTER — Telehealth: Payer: Self-pay | Admitting: *Deleted

## 2016-08-19 DIAGNOSIS — D2271 Melanocytic nevi of right lower limb, including hip: Secondary | ICD-10-CM | POA: Diagnosis not present

## 2016-08-19 DIAGNOSIS — L570 Actinic keratosis: Secondary | ICD-10-CM | POA: Diagnosis not present

## 2016-08-19 DIAGNOSIS — L918 Other hypertrophic disorders of the skin: Secondary | ICD-10-CM | POA: Diagnosis not present

## 2016-08-19 DIAGNOSIS — D1801 Hemangioma of skin and subcutaneous tissue: Secondary | ICD-10-CM | POA: Diagnosis not present

## 2016-08-19 NOTE — Progress Notes (Unsigned)
PER DR BERRY ORDER, PLACE ORDER FOR STAT PT INR PRIOR TO PV CATH  08/24/16

## 2016-08-19 NOTE — Telephone Encounter (Signed)
-----   Message from Lorretta Harp, MD sent at 08/18/2016  5:40 AM EDT ----- Labs OK for PV angio except elevated INR. Will need stat INR morning of procedure in hosp upon arrival

## 2016-08-19 NOTE — Telephone Encounter (Signed)
Spoke to patient. Result given . Verbalized understanding PATIENT TO START LOVENOX ON 08/20/16. ORDER PLACED FOR STAT PT/INR 9/181/17.

## 2016-08-20 ENCOUNTER — Ambulatory Visit (INDEPENDENT_AMBULATORY_CARE_PROVIDER_SITE_OTHER): Payer: Medicare Other | Admitting: Gastroenterology

## 2016-08-20 ENCOUNTER — Encounter: Payer: Self-pay | Admitting: Gastroenterology

## 2016-08-20 ENCOUNTER — Telehealth: Payer: Self-pay

## 2016-08-20 VITALS — BP 132/80 | HR 72 | Ht 68.0 in | Wt 174.0 lb

## 2016-08-20 DIAGNOSIS — I701 Atherosclerosis of renal artery: Secondary | ICD-10-CM | POA: Diagnosis not present

## 2016-08-20 DIAGNOSIS — D7589 Other specified diseases of blood and blood-forming organs: Secondary | ICD-10-CM

## 2016-08-20 DIAGNOSIS — Z7901 Long term (current) use of anticoagulants: Secondary | ICD-10-CM | POA: Diagnosis not present

## 2016-08-20 DIAGNOSIS — R197 Diarrhea, unspecified: Secondary | ICD-10-CM

## 2016-08-20 DIAGNOSIS — K921 Melena: Secondary | ICD-10-CM

## 2016-08-20 DIAGNOSIS — Z86010 Personal history of colonic polyps: Secondary | ICD-10-CM | POA: Diagnosis not present

## 2016-08-20 MED ORDER — HYOSCYAMINE SULFATE SL 0.125 MG SL SUBL: SUBLINGUAL_TABLET | SUBLINGUAL | 11 refills | Status: DC

## 2016-08-20 MED ORDER — PANTOPRAZOLE SODIUM 40 MG PO TBEC: 40.0000 mg | DELAYED_RELEASE_TABLET | Freq: Every day | ORAL | 11 refills | Status: DC

## 2016-08-20 MED ORDER — NA SULFATE-K SULFATE-MG SULF 17.5-3.13-1.6 GM/177ML PO SOLN: 1.0000 | Freq: Once | ORAL | 0 refills | Status: AC

## 2016-08-20 NOTE — Telephone Encounter (Signed)
Per patient to send letter to Dr. Johnsie Cancel regarding holding Coumadin prior to Endoscopy/ Colonoscopy. Pleas advise Dr. Johnsie Cancel.

## 2016-08-20 NOTE — Patient Instructions (Signed)
Your physician has requested that you go to the basement for lab work before leaving today.  We have sent the following medications to your pharmacy for you to pick up at your convenience: Levsin and Protonix.   You have been scheduled for an endoscopy and colonoscopy. Please follow the written instructions given to you at your visit today. Please pick up your prep supplies at the pharmacy within the next 1-3 days. If you use inhalers (even only as needed), please bring them with you on the day of your procedure. Your physician has requested that you go to www.startemmi.com and enter the access code given to you at your visit today. This web site gives a general overview about your procedure. However, you should still follow specific instructions given to you by our office regarding your preparation for the procedure.  Thank you for choosing me and [facility].  Pricilla Riffle. Dagoberto Ligas., MD., Marval Regal

## 2016-08-20 NOTE — Telephone Encounter (Signed)
08/20/2016   RE: [name] [name] DOB: 18-Jan-1946 MRN: [id number]   Dear Dr. Virgina Jock,    We have scheduled the above patient for an endoscopic procedure. Our records show that he is on anticoagulation therapy.   Please advise as to how long the patient may come off his therapy of coumadin prior to the procedure, which is scheduled for 09/29/16  Please fax back/ or route the completed form to Marlon Pel, [place]  at [phone].   Sincerely,    Marlon Pel, CMA Lucio Edward, MD

## 2016-08-20 NOTE — Progress Notes (Signed)
History of Present Illness: This is a 70 year old male referred by Shon Baton, MD for the evaluation of change in bowel habits, diarrhea, epigastric pain and melena. He is a former patient of Dr. Sharlett Iles and last underwent colonoscopy in 11/2008 for a history of adenomatous colon polyps that showed diverticulosis and internal hemorrhoids. He relates several months of upper abdominal discomfort and urgent diarrhea following breakfast. About 2 weeks ago he had 2-3 days of dark and tarry stools and the symptoms resolved spontaneously. He was evaluated by Dr. Virgina Jock on Friday blood work reveals glucose of 198 hemoglobin of 16.6 with an MCV of 105.9. DRE was normal and stool testing was negative for occult blood. Patient denies aspirin and NSAID usage. He has a peripheral vascular angiogram scheduled for Monday. Denies weight loss, constipation, change in stool caliber, hematochezia, nausea, vomiting, dysphagia, reflux symptoms, chest pain.   No Known Allergies Outpatient Medications Prior to Visit  Medication Sig Dispense Refill  . amLODipine (NORVASC) 10 MG tablet Take 10 mg by mouth daily.  5  . amoxicillin (AMOXIL) 500 MG capsule Take 2,000 mg by mouth See admin instructions. Will take 30-60 minutes prior to dental appointments  0  . atorvastatin (LIPITOR) 40 MG tablet Take 40 mg by mouth daily.     . canagliflozin (INVOKANA) 100 MG TABS tablet Take 100 mg by mouth daily.    [name] enoxaparin (LOVENOX) 80 MG/0.8ML injection Inject 0.8 mLs (80 mg total) into the skin every 12 (twelve) hours. (Patient taking differently: Inject 80 mg into the skin every 12 (twelve) hours. Will start prior to surgery per Dr) 20 Syringe 0  . glyBURIDE (DIABETA) 2.5 MG tablet Take 5 mg by mouth daily with breakfast.     . hydrochlorothiazide (MICROZIDE) 12.5 MG capsule TAKE ONE CAPSULE BY MOUTH EVERY DAY 30 capsule 2  . metoprolol succinate (TOPROL-XL) 50 MG 24 hr tablet Take 1 tablet (50 mg total) by mouth daily. Take  with or immediately following a meal. 30 tablet 6  . sildenafil (REVATIO) 20 MG tablet Take 20 mg by mouth as directed.    . trandolapril (MAVIK) 4 MG tablet Take 4 mg by mouth daily.    [name] warfarin (COUMADIN) 7.5 MG tablet TAKE AS DIRECTED BY COUMADIN CLINIC 40 tablet 3   No facility-administered medications prior to visit.    Past Medical History:  Diagnosis Date  . Adenomatous colon polyp 09/1997  . Anemia   . Aortic stenosis    s/p st. jude mechanical AVR - Chronic Coumadin  . Blood transfusion   . Bronchitis 12/17/11   "just gettin over a case"  . Claudication ([place])   . Coronary artery disease    s/p cabg x 3 11/2003: lima-lad, seq vg to rpda and rpl  . Diabetes mellitus   . Diverticulitis of colon   . Hyperlipidemia   . Hypertension   . ITP (idiopathic thrombocytopenic purpura)   . Peripheral arterial disease ([place])    a. history of aortobifemoral bypass grafting by Dr. Sherren Mocha early b. LE angiography 04/22/2015 patent aortobifem graft, DES to R SFA  . Peripheral vascular disease ([place])    s/p Left external Iliac Artery stenting and subsequent left femoral endarterectomy 02/2011- post op course complicated by wound infxn req I&D 03/2011  . Renal artery stenosis, native, bilateral ([facility])    a. bilateral renal artery stenosis by recent duplex ultrasound b. L renal artery stent 02/2015, R renal artery patent on angiogram   Past  Surgical History:  Procedure Laterality Date  . ABDOMINAL AORTAGRAM N/A 12/16/2011   Procedure: ABDOMINAL Maxcine Ham;  Surgeon: Sherren Mocha, MD;  Location: [facility] CATH LAB;  Service: Cardiovascular;  Laterality: N/A;  . ANGIOPLASTY / STENTING ILIAC     Left external Iliac Artery  . AORTA - BILATERAL FEMORAL ARTERY BYPASS GRAFT  01/18/2012   Procedure: AORTA BIFEMORAL BYPASS GRAFT;  Surgeon: Curt Jews, MD;  Location: [place];  Service: Vascular;  Laterality: N/A;  . AORTIC VALVE REPLACEMENT  ~ 2004  . CARDIAC VALVE REPLACEMENT     aortic  . CORONARY ANGIOPLASTY WITH  STENT PLACEMENT  12/16/11  . CORONARY ARTERY BYPASS GRAFT    . LOWER EXTREMITY ANGIOGRAM N/A 02/21/2015   Procedure: LOWER EXTREMITY ANGIOGRAM;  Surgeon: Lorretta Harp, MD;  Location: [facility] CATH LAB;  Service: Cardiovascular;  Laterality: N/A;  . PERIPHERAL VASCULAR CATHETERIZATION N/A 04/22/2015   Procedure: Lower Extremity Angiography;  Surgeon: Lorretta Harp, MD;  Location: [facility] CV LAB;  Service: Cardiovascular;  Laterality: N/A;  . RENAL ANGIOGRAM N/A 02/21/2015   Procedure: RENAL ANGIOGRAM;  Surgeon: Lorretta Harp, MD;  Location: [facility] CATH LAB;  Service: Cardiovascular;  Laterality: Bilateral; 6 mm x 12 mm long Herculink balloon expandable stent to the left renal artery  . RENAL ARTERY STENT Left 04/22/2015   dr berry  . SPLENECTOMY  ~ 2003  . TONSILLECTOMY  ~ 48   Social History   Social History  . Marital status: Divorced    Spouse name: N/A  . Number of children: 3  . Years of education: N/A   Occupational History  . Retired    Social History Main Topics  . Smoking status: Former Smoker    Packs/day: 1.50    Years: 30.00    Types: Cigarettes    Quit date: 12/15/1993  . Smokeless tobacco: Never Used  . Alcohol use 7.0 oz/week    14 Standard drinks or equivalent per week     Comment: drinks 2 martini's a night  . Drug use: No  . Sexual activity: Yes   Other Topics Concern  . None   Social History Narrative   Tries to remain active.  Frequent golfer but claudication limits this.   Family History  Problem Relation Age of Onset  . Coronary artery disease Mother     bypass surgery - deceased  . Heart disease Father     murmur, valve replacement - deceased  . Breast cancer Sister   . Diabetes      grandmother  . Colon cancer Neg Hx       Review of Systems: Pertinent positive and negative review of systems were noted in the above HPI section. All other review of systems were otherwise negative.   Physical Exam: General: Well developed, well  nourished, no acute distress Head: Normocephalic and atraumatic Eyes:  sclerae anicteric, EOMI Ears: Normal auditory acuity Mouth: No deformity or lesions Neck: Supple, no masses or thyromegaly Lungs: Clear throughout to auscultation Heart: Regular rate and rhythm; prosthetic valve sounds, no murmurs, rubs or bruits Abdomen: Soft, mild upper abdominal tenderness and non distended. No masses, hepatosplenomegaly or hernias noted. Normal Bowel sounds Rectal: deferred to colonoscopy. DRE by Dr. Virgina Jock on Friday-heme negative, no lesions.  Musculoskeletal: Symmetrical with no gross deformities  Skin: No lesions on visible extremities Pulses:  Normal pulses noted Extremities: No clubbing, cyanosis, edema or deformities noted Neurological: Alert oriented x 4, grossly nonfocal Cervical Nodes:  No significant cervical adenopathy  Inguinal Nodes: No significant inguinal adenopathy Psychological:  Alert and cooperative. Normal mood and affect  Assessment and Recommendations:  1. Diarrhea. R/O infectious, inflammatory, celiac disease. GI pathogen panel, tTG, IgA. Levsin 1-2 tid ac prn. Imodium bid prn. Schedule colonoscopy. The risks (including bleeding, perforation, infection, missed lesions, medication reactions and possible hospitalization or surgery if complications occur), benefits, and alternatives to colonoscopy with possible biopsy and possible polypectomy were discussed with the patient and they consent to proceed.   2. History of melena and epigastric pain. R/O ulcer, gastritis, duodenitis, esophagitis. Pantoprazole 40 mg po daily. Avoid ASA/NSAIDs. Schedule EGD. The risks (including bleeding, perforation, infection, missed lesions, medication reactions and possible hospitalization or surgery if complications occur), benefits, and alternatives to endoscopy with possible biopsy and possible dilation were discussed with the patient and they consent to proceed.   3. Personal history of adenomatous  colon polyps. He is overdue for a five-year surveillance colonoscopy. Colonoscopy as above.   4. Macrocytosis. Check B12, folate. Follow up with Dr. Virgina Jock.  5. S/P AVR. Hold Coumadin 5 days before procedure with a Lovenox bridge - will instruct when and how to resume after procedure. Same mgmt plan as he is currently doing for his angiogram. Low but real risk of cardiovascular event such as heart attack, stroke, embolism, thrombosis or ischemia/infarct of other organs off Coumadin explained and need to seek urgent help if this occurs. The patient consents to proceed. Will communicate by phone or EMR with patient's prescribing provider to confirm that holding Coumadin is reasonable in this case.   6. PVD. Angiogram scheduled on 9/18.   cc: Shon Baton, MD [address] [place], [place] [address]

## 2016-08-24 ENCOUNTER — Encounter (HOSPITAL_COMMUNITY)
Admission: RE | Disposition: A | Discharge status: Home / Self Care | Payer: Self-pay | Source: Ambulatory Visit | Attending: Cardiovascular Disease

## 2016-08-24 ENCOUNTER — Ambulatory Visit (HOSPITAL_COMMUNITY)
Admission: RE | Admit: 2016-08-24 | Discharge: 2016-08-24 | Disposition: A | Discharge status: Home / Self Care | Payer: Medicare Other | Source: Ambulatory Visit | Attending: Cardiovascular Disease | Admitting: Cardiovascular Disease

## 2016-08-24 ENCOUNTER — Encounter (HOSPITAL_COMMUNITY): Payer: Self-pay | Admitting: Cardiovascular Disease

## 2016-08-24 DIAGNOSIS — E785 Hyperlipidemia, unspecified: Secondary | ICD-10-CM | POA: Diagnosis not present

## 2016-08-24 DIAGNOSIS — Z87891 Personal history of nicotine dependence: Secondary | ICD-10-CM | POA: Diagnosis not present

## 2016-08-24 DIAGNOSIS — I70213 Atherosclerosis of native arteries of extremities with intermittent claudication, bilateral legs: Secondary | ICD-10-CM | POA: Insufficient documentation

## 2016-08-24 DIAGNOSIS — I10 Essential (primary) hypertension: Secondary | ICD-10-CM | POA: Insufficient documentation

## 2016-08-24 DIAGNOSIS — I701 Atherosclerosis of renal artery: Secondary | ICD-10-CM | POA: Diagnosis not present

## 2016-08-24 DIAGNOSIS — E1151 Type 2 diabetes mellitus with diabetic peripheral angiopathy without gangrene: Secondary | ICD-10-CM | POA: Insufficient documentation

## 2016-08-24 DIAGNOSIS — Z951 Presence of aortocoronary bypass graft: Secondary | ICD-10-CM | POA: Insufficient documentation

## 2016-08-24 DIAGNOSIS — Z7901 Long term (current) use of anticoagulants: Secondary | ICD-10-CM | POA: Insufficient documentation

## 2016-08-24 DIAGNOSIS — I739 Peripheral vascular disease, unspecified: Secondary | ICD-10-CM

## 2016-08-24 HISTORY — PX: PERIPHERAL VASCULAR CATHETERIZATION: SHX172C

## 2016-08-24 LAB — GLUCOSE, CAPILLARY
GLUCOSE-CAPILLARY: 144 mg/dL — AB (ref 65–99)
Glucose-Capillary: 168 mg/dL — ABNORMAL HIGH (ref 65–99)

## 2016-08-24 LAB — PROTIME-INR
INR: 0.93
Prothrombin Time: 12.5 seconds (ref 11.4–15.2)

## 2016-08-24 SURGERY — LOWER EXTREMITY ANGIOGRAPHY
Anesthesia: LOCAL

## 2016-08-24 MED ORDER — MIDAZOLAM HCL 2 MG/2ML IJ SOLN
INTRAMUSCULAR | Status: DC | PRN
  Administered 2016-08-24: 1 mg via INTRAVENOUS

## 2016-08-24 MED ORDER — ONDANSETRON HCL 4 MG/2ML IJ SOLN: 4.0000 mg | Freq: Four times a day (QID) | INTRAMUSCULAR | Status: DC | PRN

## 2016-08-24 MED ORDER — FENTANYL CITRATE (PF) 100 MCG/2ML IJ SOLN
INTRAMUSCULAR | Status: DC | PRN
  Administered 2016-08-24: 25 ug via INTRAVENOUS

## 2016-08-24 MED ORDER — MIDAZOLAM HCL 2 MG/2ML IJ SOLN
INTRAMUSCULAR | Status: AC
  Filled 2016-08-24: qty 2

## 2016-08-24 MED ORDER — FENTANYL CITRATE (PF) 100 MCG/2ML IJ SOLN
INTRAMUSCULAR | Status: AC
  Filled 2016-08-24: qty 2

## 2016-08-24 MED ORDER — SODIUM CHLORIDE 0.9 % WEIGHT BASED INFUSION
3.0000 mL/kg/h | INTRAVENOUS | Status: DC
  Administered 2016-08-24: 3 mL/kg/h via INTRAVENOUS

## 2016-08-24 MED ORDER — ACETAMINOPHEN 325 MG PO TABS: 650.0000 mg | ORAL_TABLET | ORAL | Status: DC | PRN

## 2016-08-24 MED ORDER — HEPARIN (PORCINE) IN NACL 2-0.9 UNIT/ML-% IJ SOLN
INTRAMUSCULAR | Status: DC | PRN
  Administered 2016-08-24: 1000 mL via INTRA_ARTERIAL

## 2016-08-24 MED ORDER — SODIUM CHLORIDE 0.9 % WEIGHT BASED INFUSION: 1.0000 mL/kg/h | INTRAVENOUS | Status: DC

## 2016-08-24 MED ORDER — ASPIRIN 81 MG PO CHEW
81.0000 mg | CHEWABLE_TABLET | ORAL | Status: AC
  Administered 2016-08-24: 81 mg via ORAL

## 2016-08-24 MED ORDER — SODIUM CHLORIDE 0.9 % IV SOLN: INTRAVENOUS | Status: AC

## 2016-08-24 MED ORDER — SODIUM CHLORIDE 0.9% FLUSH: 3.0000 mL | INTRAVENOUS | Status: DC | PRN

## 2016-08-24 MED ORDER — LIDOCAINE HCL (PF) 1 % IJ SOLN
INTRAMUSCULAR | Status: AC
  Filled 2016-08-24: qty 30

## 2016-08-24 MED ORDER — HEPARIN (PORCINE) IN NACL 2-0.9 UNIT/ML-% IJ SOLN
INTRAMUSCULAR | Status: AC
  Filled 2016-08-24: qty 1000

## 2016-08-24 MED ORDER — ASPIRIN 81 MG PO CHEW
CHEWABLE_TABLET | ORAL | Status: AC
  Filled 2016-08-24: qty 1

## 2016-08-24 MED ORDER — LIDOCAINE HCL (PF) 1 % IJ SOLN
INTRAMUSCULAR | Status: DC | PRN
  Administered 2016-08-24: 12 mg

## 2016-08-24 MED ORDER — IODIXANOL 320 MG/ML IV SOLN
INTRAVENOUS | Status: DC | PRN
  Administered 2016-08-24: 165 mL via INTRA_ARTERIAL

## 2016-08-24 MED ORDER — HYDRALAZINE HCL 20 MG/ML IJ SOLN: 10.0000 mg | INTRAMUSCULAR | Status: DC | PRN

## 2016-08-24 SURGICAL SUPPLY — 11 items
CATH ANGIO 5F PIGTAIL 65CM (CATHETERS) ×1 IMPLANT
CATH CROSS OVER TEMPO 5F (CATHETERS) ×1 IMPLANT
KIT MICROINTRODUCER STIFF 5F (SHEATH) ×1 IMPLANT
KIT PV (KITS) ×2 IMPLANT
SHEATH PINNACLE 5F 10CM (SHEATH) ×1 IMPLANT
SHEATH PINNACLE 6F 10CM (SHEATH) ×1 IMPLANT
SYR MEDRAD MARK V 150ML (SYRINGE) ×2 IMPLANT
TRANSDUCER W/STOPCOCK (MISCELLANEOUS) ×2 IMPLANT
TRAY PV CATH (CUSTOM PROCEDURE TRAY) ×2 IMPLANT
WIRE HITORQ VERSACORE ST 145CM (WIRE) ×1 IMPLANT
WIRE ROSEN-J .035X180CM (WIRE) ×1 IMPLANT

## 2016-08-24 NOTE — Discharge Instructions (Signed)
Groin Surgical Site Care °Refer to this sheet in the next few weeks. These instructions provide you with information about caring for yourself after your procedure. Your health care provider may also give you more specific instructions. Your treatment has been planned according to current medical practices, but problems sometimes occur. Call your health care provider if you have any problems or questions after your procedure. °WHAT TO EXPECT AFTER THE PROCEDURE °After your procedure, it is typical to have the following: °· Bruising at the groin site that usually fades within 1-2 weeks. °· Blood collecting in the tissue (hematoma) that may be painful to the touch. It should usually decrease in size and tenderness within 1-2 weeks. °HOME CARE INSTRUCTIONS °· Take medicines only as directed by your health care provider. °· You may shower 24-48 hours after the procedure or as directed by your health care provider. Remove the bandage (dressing) and gently wash the site with plain soap and water. Pat the area dry with a clean towel. Do not rub the site, because this may cause bleeding. °· Do not take baths, swim, or use a hot tub until your health care provider approves. °· Check your insertion site every day for redness, swelling, or drainage. °· Do not apply powder or lotion to the site. °· Limit use of stairs to twice a day for the first 2-3 days or as directed by your health care provider. °· Do not squat for the first 2-3 days or as directed by your health care provider. °· Do not lift over 10 lb (4.5 kg) for 5 days after your procedure or as directed by your health care provider. °· Ask your health care provider when it is okay to: °¨ Return to work or school. °¨ Resume usual physical activities or sports. °¨ Resume sexual activity. °· Do not drive home if you are discharged the same day as the procedure. Have someone else drive you. °· You may drive 24 hours after the procedure unless otherwise instructed by your  health care provider. °· Do not operate machinery or power tools for 24 hours after the procedure or as directed by your health care provider. °· If your procedure was done as an outpatient procedure, which means that you went home the same day as your procedure, a responsible adult should be with you for the first 24 hours after you arrive home. °· Keep all follow-up visits as directed by your health care provider. This is important. °SEEK MEDICAL CARE IF: °· You have a fever. °· You have chills. °· You have increased bleeding from the groin site. Hold pressure on the site. °SEEK IMMEDIATE MEDICAL CARE IF: °· You have unusual pain at the groin site. °· You have redness, warmth, or swelling at the groin site. °· You have drainage (other than a small amount of blood on the dressing) from the groin site. °· The groin site is bleeding, and the bleeding does not stop after 30 minutes of holding steady pressure on the site. °· Your leg or foot becomes pale, cool, tingly, or numb. °  °This information is not intended to replace advice given to you by your health care provider. Make sure you discuss any questions you have with your health care provider. °  °Document Released: 07/27/2014 Document Reviewed: 07/27/2014 °Elsevier Interactive Patient Education ©2016 [employer] Inc[employer] ° °

## 2016-08-24 NOTE — Progress Notes (Signed)
Site area: left groin fa sheath Site Prior to Removal:  Level 0 Pressure Applied For:  20 minutes Manual:   yes Patient Status During Pull:  stable Post Pull Site:  Level  0 Post Pull Instructions Given:  yes Post Pull Pulses Present: yes Dressing Applied:  tegaderm Bedrest begins @ 0935 Comments:

## 2016-08-24 NOTE — Telephone Encounter (Signed)
Left a message for Caryl Pina at [facility] to call me back after Dr. Virgina Jock has decided if patient can come off there coumadin prior to procedure on 09/29/16.

## 2016-08-24 NOTE — Interval H&P Note (Signed)
History and Physical Interval Note:  08/24/2016 7:43 AM  [name] [name]  has presented today for surgery, with the diagnosis of PAD  The various methods of treatment have been discussed with the patient and family. After consideration of risks, benefits and other options for treatment, the patient has consented to  Procedure(s): Lower Extremity Angiography (N/A) as a surgical intervention .  The patient's history has been reviewed, patient examined, no change in status, stable for surgery.  I have reviewed the patient's chart and labs.  Questions were answered to the patient's satisfaction.     Quay Burow

## 2016-08-24 NOTE — H&P (Signed)
08/24/16 [name] [name]   02-02-1946  [id number]  Primary Physician [name] Reel, MD Primary Cardiologist: [name] Harp MD [name] [name]  HPI:  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 He was last seen in the office by Dr. Gwenlyn [name] on 07/22/16. He retired a little over 2 years ago having worked there for 25 years. His primary care physician is Dr. Virgina [name]. He was referred by Dr. Johnsie [name] for peripheral vascular evaluation because of resistant hypertension with duplex evidence of bilateral renal artery stenosis as well as new-onset right calf claudication with recent Dopplers that showed a lesion in his mid right SFA. He does have a history of remote tobacco abuse as well as treated hypertension, hyperlipidemia and diabetes. He has had coronary bypass grafting and St. Jude aVR performed by Dr. Cyndia [name] December 2004 on Coumadin anticoagulation. He has also had aortobifemoral bypass grafting, left external iliac stenting and left common femoral endarterectomy performed by Dr. Donnetta [name]. I performed left renal artery stenting 02/21/15 with an excellent angiographic and clinical result. His blood pressure has been under better control. I then perform staged right SFA directional atherectomy 04/22/15 removing a high-grade eccentric calcified mid to distal right SFA plaque. He did have progression of disease at the origin of his right SFA. His claudication markedly improved after his procedure however since I saw on the year ago he's had some recurrent claudication. His blood pressures have been under better control since his renal artery intervention. Recent lower extremity Doppler studies performed 07/15/16 revealed a right ABI 0.34 with a new high-frequency signal in the origin of his right SFA. We will proceed with  angiography and potential intervention with Lovenox bridging.         Current Outpatient Prescriptions  Medication Sig Dispense Refill  . amLODipine (NORVASC) 10 MG tablet Take 10 mg by mouth daily.  5  . atorvastatin (LIPITOR) 40 MG tablet Take 40 mg by mouth daily.     . canagliflozin (INVOKANA) 100 MG TABS tablet Take 100 mg by mouth daily.    [name] ezetimibe (ZETIA) 10 MG tablet Take 10 mg by mouth daily.    [name] glyBURIDE (DIABETA) 2.5 MG tablet Take 2 tablets (5 mg) by mouth every morning and 1 tablet (2.5 mg) by mouth at bedtime    . hydrochlorothiazide (MICROZIDE) 12.5 MG capsule TAKE ONE CAPSULE BY MOUTH EVERY DAY 30 capsule 2  . metoprolol succinate (TOPROL-XL) 50 MG 24 hr tablet Take 1 tablet (50 mg total) by mouth daily. Take with or immediately following a meal. 30 tablet 6  . sildenafil (REVATIO) 20 MG tablet Take 20 mg by mouth as directed.    . warfarin (COUMADIN) 7.5 MG tablet TAKE AS DIRECTED BY COUMADIN CLINIC 40 tablet 3   No current facility-administered medications for this visit.     No Known Allergies  Social History        Social History  . Marital status: Divorced    Spouse name: N/A  . Number of children: N/A  . Years of education: N/A      Occupational History  . Not on file.         Social History Main Topics  . Smoking status: Former Smoker    Packs/day: 1.50    Years: 30.00    Types: Cigarettes  Quit date: 12/15/1993  . Smokeless tobacco: Never Used  . Alcohol use 7.0 oz/week    14 Standard drinks or equivalent per week     Comment: drinks 2 martini's a night  . Drug use: No  . Sexual activity: Yes       Other Topics Concern  . Not on file      Social History Narrative   Tries to remain active.  Frequent golfer but claudication limits this.     Review of Systems: General: negative for chills, fever, night sweats or weight changes.  Cardiovascular: negative for chest pain, dyspnea on exertion,  edema, orthopnea, palpitations, paroxysmal nocturnal dyspnea or shortness of breath Dermatological: negative for rash Respiratory: negative for cough or wheezing Urologic: negative for hematuria Abdominal: negative for nausea, vomiting, diarrhea, bright red blood per rectum, melena, or hematemesis Neurologic: negative for visual changes, syncope, or dizziness All other systems reviewed and are otherwise negative except as noted above.   Physical Exam: Temp:  [98.8 F (37.1 C)] 98.8 F (37.1 C) (09/18 0622) Pulse Rate:  [65] 65 (09/18 0622) BP: (137)/(47) 137/47 (09/18 0622) Weight:  [78.9 kg (174 lb)] 78.9 kg (174 lb) (09/18 0622)  General appearance: alert and no distress Neck: no adenopathy, no JVD, supple, symmetrical, trachea midline, thyroid not enlarged, symmetric, no tenderness/mass/nodules and Bilateral carotid bruits versus transmitted murmur Lungs: clear to auscultation bilaterally Heart: Crisp prosthetic valve sounds Extremities: extremities normal, atraumatic, no cyanosis or edema and 2+ right femoral without bruit  EKG not performed today  ASSESSMENT AND PLAN:   Peripheral vascular disease [name] returns for evaluation of progressive claudication involving the right lower extremity. Of his mid right SFA. Antegrade stick in May of last year which resulted in marked improvement in his claudication however, over the last several months his claudication has recurred and is Dopplers reveal a decline in his ABI 0.34 with a new high-frequency signal in his proximal left is status post remote aortobifemoral bypass grafting as well as left external iliac artery stenting and left common femoral endarterectomy by [name] [name]. I stented his left renal artery 02/21/15 with an excellent angiographic, clinical and ultrasound results. I ultimately performed directional atherectomy in May of last year of his mid right SFA with antegrade stick. His claudication improved after that  however left several months he's had recurrence of his right  lower extremity claudication with recent Dopplers that showed decline in his right ABI 0.3 for any new high-frequency signal in the origin of his right SFA. We will plan on performing relook angiography and potential intervention with a Lovenox bridge.  Nelva Bush, MD [facility] Pager: [phone]

## 2016-08-24 NOTE — Telephone Encounter (Signed)
He just used lovenox bridge for his PV angiogram today with Gerald Stabs end Would use same protocol for lovenox prior to EGD

## 2016-08-24 NOTE — Telephone Encounter (Signed)
Pt at high risk for stroke - has hx of St Jude AVR, afib, and CVA. Will need Lovenox bridging if pt holds Coumadin for 5 days - pt has used Lovenox multiple times in the past (currently in the middle of a different Lovenox bridge for PV angiogram today). Using Lovenox '80mg'$  BID currently. Will coordinate Lovenox bridging for 10/24 in clinic at next Coumadin check.

## 2016-08-25 NOTE — Telephone Encounter (Signed)
See note

## 2016-08-28 ENCOUNTER — Ambulatory Visit (INDEPENDENT_AMBULATORY_CARE_PROVIDER_SITE_OTHER): Payer: Medicare Other | Admitting: *Deleted

## 2016-08-28 DIAGNOSIS — G459 Transient cerebral ischemic attack, unspecified: Secondary | ICD-10-CM

## 2016-08-28 DIAGNOSIS — I359 Nonrheumatic aortic valve disorder, unspecified: Secondary | ICD-10-CM | POA: Diagnosis not present

## 2016-08-28 DIAGNOSIS — Z5181 Encounter for therapeutic drug level monitoring: Secondary | ICD-10-CM | POA: Diagnosis not present

## 2016-08-28 LAB — POCT INR: INR: 2.6

## 2016-09-08 ENCOUNTER — Encounter: Payer: Self-pay | Admitting: Cardiovascular Disease

## 2016-09-08 ENCOUNTER — Other Ambulatory Visit: Payer: Self-pay | Admitting: *Deleted

## 2016-09-08 ENCOUNTER — Ambulatory Visit (INDEPENDENT_AMBULATORY_CARE_PROVIDER_SITE_OTHER): Payer: Medicare Other | Admitting: Cardiovascular Disease

## 2016-09-08 VITALS — BP 185/66 | HR 87 | Ht 68.0 in | Wt 176.2 lb

## 2016-09-08 DIAGNOSIS — Z7901 Long term (current) use of anticoagulants: Secondary | ICD-10-CM | POA: Diagnosis not present

## 2016-09-08 DIAGNOSIS — R1084 Generalized abdominal pain: Secondary | ICD-10-CM | POA: Diagnosis not present

## 2016-09-08 DIAGNOSIS — E1122 Type 2 diabetes mellitus with diabetic chronic kidney disease: Secondary | ICD-10-CM | POA: Diagnosis not present

## 2016-09-08 DIAGNOSIS — I771 Stricture of artery: Secondary | ICD-10-CM | POA: Diagnosis not present

## 2016-09-08 DIAGNOSIS — I739 Peripheral vascular disease, unspecified: Secondary | ICD-10-CM | POA: Diagnosis not present

## 2016-09-08 DIAGNOSIS — E1151 Type 2 diabetes mellitus with diabetic peripheral angiopathy without gangrene: Secondary | ICD-10-CM | POA: Diagnosis not present

## 2016-09-08 DIAGNOSIS — Z01818 Encounter for other preprocedural examination: Secondary | ICD-10-CM

## 2016-09-08 DIAGNOSIS — Z952 Presence of prosthetic heart valve: Secondary | ICD-10-CM | POA: Diagnosis not present

## 2016-09-08 DIAGNOSIS — N182 Chronic kidney disease, stage 2 (mild): Secondary | ICD-10-CM | POA: Diagnosis not present

## 2016-09-08 DIAGNOSIS — Z6826 Body mass index (BMI) 26.0-26.9, adult: Secondary | ICD-10-CM | POA: Diagnosis not present

## 2016-09-08 DIAGNOSIS — I701 Atherosclerosis of renal artery: Secondary | ICD-10-CM | POA: Diagnosis not present

## 2016-09-08 DIAGNOSIS — Z79899 Other long term (current) drug therapy: Secondary | ICD-10-CM | POA: Diagnosis not present

## 2016-09-08 DIAGNOSIS — I7389 Other specified peripheral vascular diseases: Secondary | ICD-10-CM | POA: Diagnosis not present

## 2016-09-08 DIAGNOSIS — Z23 Encounter for immunization: Secondary | ICD-10-CM | POA: Diagnosis not present

## 2016-09-08 DIAGNOSIS — G4709 Other insomnia: Secondary | ICD-10-CM | POA: Diagnosis not present

## 2016-09-08 DIAGNOSIS — I1310 Hypertensive heart and chronic kidney disease without heart failure, with stage 1 through stage 4 chronic kidney disease, or unspecified chronic kidney disease: Secondary | ICD-10-CM | POA: Diagnosis not present

## 2016-09-08 NOTE — Patient Instructions (Addendum)
Medication Instructions:  NO CHANGES.  PRIOR TO PROCEDURE, You will receive instruction on Lovenox bridging.  Testing/Procedures: Dr. Gwenlyn Found has ordered a peripheral angiogram WITH RIGHT SFA INTERVENTION, ANTEGRADE STICK to be done at [facility].  This procedure is going to look at the bloodflow in your lower extremities.  If Dr. Gwenlyn Found is able to open up the arteries, you will have to spend one night in the hospital.  If he is not able to open the arteries, you will be able to go home that same day.    After the procedure, you will not be allowed to drive for 3 days or push, pull, or lift anything greater than 10 lbs for one week.    You will be required to have the following tests prior to the procedure:  1. Blood work-the blood work can be done no more than 14 days prior to the procedure.  It can be done at any [facility] lab.  There is one downstairs on the first floor of this building and one in the [facility] building [id number])   *REPS  SCOTT  Puncture site RIGHT GROIN   Follow-Up: Your physician recommends that you schedule a follow-up appointment WITH THE PHARMACY FOR LOVENOX BRIDGING.    If you need a refill on your cardiac medications before your next appointment, please call your pharmacy.

## 2016-09-08 NOTE — Progress Notes (Signed)
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 I last saw him in the office 07/22/16. He retired a little over 2 years ago having worked there for 25 years. His primary care physician is Dr. Virgina Jock. He was referred by Dr. Johnsie Cancel for peripheral vascular evaluation because of resistant hypertension with duplex evidence of bilateral renal artery stenosis as well as new-onset right calf claudication with recent Dopplers that showed a lesion in his mid right SFA. He does have a history of remote tobacco abuse as well as treated hypertension, hyperlipidemia and diabetes. He has had coronary bypass grafting and St. Jude aVR performed by Dr. Cyndia Bent December 2004 on Coumadin anticoagulation. He has also had aortobifemoral bypass grafting, left external iliac stenting and left common femoral endarterectomy performed by Dr. Donnetta Hutching. I performed left renal artery stenting 02/21/15 with an excellent angiographic and clinical result. His blood pressure has been under better control. I then perform staged right SFA directional atherectomy 04/22/15 removing a high-grade eccentric calcified mid to distal right SFA plaque. He did have progression of disease at the origin of his right SFA. His claudication markedly improved after his procedure however since I saw on the year ago he's had some recurrent claudication. His blood pressures have been under better control since his renal artery intervention. Recent lower extremity Doppler studies performed 07/15/16 revealed a right ABI 0.34 with a new high-frequency signal in the origin of his right SFA.  I performed angiography 08/24/16 revealing a 95%  proximal right SFA stenosis, 80% mid right SFA stenosis and 80% calcified mid left SFA stenosis. In addition, he had 75% "in-stent restenosis" within the previously placed left renal artery stent. Intervention will be somewhat challenging given the proximity of the proximal right SFA stenosis to potential antegrade stick  Current Outpatient Prescriptions  Medication Sig Dispense Refill  . amLODipine (NORVASC) 10 MG tablet Take 10 mg by mouth daily.  5  . amoxicillin (AMOXIL) 500 MG capsule Take 2,000 mg by mouth See admin instructions. Will take 30-60 minutes prior to dental appointments  0  . atorvastatin (LIPITOR) 40 MG tablet Take 40 mg by mouth daily.     . canagliflozin (INVOKANA) 100 MG TABS tablet Take 100 mg by mouth daily.    [name] enoxaparin (LOVENOX) 80 MG/0.8ML injection Inject 0.8 mLs (80 mg total) into the skin every 12 (twelve) hours. (Patient taking differently: Inject 80 mg into the skin every 12 (twelve) hours. Will start prior to surgery per Dr) 20 Syringe 0  . glyBURIDE (DIABETA) 2.5 MG tablet Take 5 mg by mouth daily with breakfast.     . hydrochlorothiazide (MICROZIDE) 12.5 MG capsule TAKE ONE CAPSULE BY MOUTH EVERY DAY 30 capsule 2  . Hyoscyamine Sulfate SL (LEVSIN/SL) 0.125 MG SUBL Take 1-2 capsules by mouth before meals 120 each 11  . metoprolol succinate (TOPROL-XL) 50 MG 24 hr tablet Take 1 tablet (50 mg total) by mouth daily. Take with or immediately following a meal. 30 tablet 6  . pantoprazole (PROTONIX) 40 MG tablet Take 1 tablet (40 mg total) by mouth daily. 30 tablet 11  . sildenafil (REVATIO) 20 MG tablet Take 20 mg by mouth as directed.    [name]  trandolapril (MAVIK) 4 MG tablet Take 4 mg by mouth daily.    [name] warfarin (COUMADIN) 7.5 MG tablet TAKE AS DIRECTED BY COUMADIN CLINIC 40 tablet 3   No current facility-administered medications for this visit.     No Known Allergies  Social History   Social History  . Marital status: Divorced    Spouse name: [name] [name]  . Number  of children: 3  . Years of education: [name] [name]   Occupational History  . Retired    Social History Main Topics  . Smoking status: Former Smoker    Packs/day: 1.50    Years: 30.00    Types: Cigarettes    Quit date: 12/15/1993  . Smokeless tobacco: Never Used  . Alcohol use 7.0 oz/week    14 Standard drinks or equivalent per week     Comment: drinks 2 martini's a night  . Drug use: No  . Sexual activity: Yes   Other Topics Concern  . Not on file   Social History Narrative   Tries to remain active.  Frequent golfer but claudication limits this.     Review of Systems: General: negative for chills, fever, night sweats or weight changes.  Cardiovascular: negative for chest pain, dyspnea on exertion, edema, orthopnea, palpitations, paroxysmal nocturnal dyspnea or shortness of breath Dermatological: negative for rash Respiratory: negative for cough or wheezing Urologic: negative for hematuria Abdominal: negative for nausea, vomiting, diarrhea, bright red blood per rectum, melena, or hematemesis Neurologic: negative for visual changes, syncope, or dizziness All other systems reviewed and are otherwise negative except as noted above.    Blood pressure (!) 185/66, pulse 87, height '5\' 8"'$  (1.727 m), weight 176 lb 3.2 oz (79.9 kg), SpO2 93 %.  General appearance: alert and no distress Neck: no adenopathy, no JVD, supple, symmetrical, trachea midline, thyroid not enlarged, symmetric, no tenderness/mass/nodules and Soft bilateral carotid bruits versus transmitted murmur Lungs: clear to auscultation bilaterally Heart: Crisp prosthetic valve sounds Extremities: extremities normal, atraumatic, no cyanosis or edema  EKG normal sinus rhythm 87 with evidence of LVH with repolarization changes. I personally reviewed this EKG  ASSESSMENT AND PLAN:   PVD (peripheral vascular disease) with claudication [facility]) Mr. Macon returns today for follow-up of his recent angiogram which I performed 08/24/16.  He has had remote aortobifemoral bypass grafting. I performed [facility] 1 directional atherectomy and drug-eluting balloon angioplasty of a high-grade right SFA stenosis 04/22/15 using an antegrade approach. He had recurrent claudication with Dopplers that showed a high-frequency signal in his proximal right SFA. He does have significant lifestyle limiting claudication. I angiogram to him revealing a 95% fairly focal proximal right SFA stenosis as well as a percent mid right SFA stenosis with three-vessel runoff. In addition, he had an 80% calcified focal mid left SFA stenosis. Approaching this is anatomically challenging because of the proximity of the stenosis to the aorto bifemoral anastomosis and 2 the groin. While this would be approachable via left brachial approach he's had bypass surgery and a LIMA graft making it less desirable. I believe I can access is right common femoral artery via an antegrade stick and have enough purchase to perform balloon angioplasty of his proximal right SFA which appears to be soft plaque allowing me to then to directional atherectomy and drug-eluting balloon angioplasty of the mid right SFA. I could potentially stent the origin of the right SFA on the way out. We've thoroughly discussed the approach the patient agrees to proceed. He'll need Lovenox bridging as he has done  in the past.      Lorretta Harp MD [facility], [facility] 09/08/2016 3:19 PM

## 2016-09-08 NOTE — Assessment & Plan Note (Signed)
[name] [name] returns today for follow-up of his recent angiogram which I performed 08/24/16. He has had remote aortobifemoral bypass grafting. I performed [facility] 1 directional atherectomy and drug-eluting balloon angioplasty of a high-grade right SFA stenosis 04/22/15 using an antegrade approach. He had recurrent claudication with Dopplers that showed a high-frequency signal in his proximal right SFA. He does have significant lifestyle limiting claudication. I angiogram to him revealing a 95% fairly focal proximal right SFA stenosis as well as a percent mid right SFA stenosis with three-vessel runoff. In addition, he had an 80% calcified focal mid left SFA stenosis. Approaching this is anatomically challenging because of the proximity of the stenosis to the aorto bifemoral anastomosis and 2 the groin. While this would be approachable via left brachial approach he's had bypass surgery and a LIMA graft making it less desirable. I believe I can access is right common femoral artery via an antegrade stick and have enough purchase to perform balloon angioplasty of his proximal right SFA which appears to be soft plaque allowing me to then to directional atherectomy and drug-eluting balloon angioplasty of the mid right SFA. I could potentially stent the origin of the right SFA on the way out. We've thoroughly discussed the approach the patient agrees to proceed. He'll need Lovenox bridging as he has done in the past.

## 2016-09-11 ENCOUNTER — Ambulatory Visit (INDEPENDENT_AMBULATORY_CARE_PROVIDER_SITE_OTHER): Payer: Medicare Other

## 2016-09-11 DIAGNOSIS — Z5181 Encounter for therapeutic drug level monitoring: Secondary | ICD-10-CM | POA: Diagnosis not present

## 2016-09-11 DIAGNOSIS — I359 Nonrheumatic aortic valve disorder, unspecified: Secondary | ICD-10-CM

## 2016-09-11 DIAGNOSIS — G459 Transient cerebral ischemic attack, unspecified: Secondary | ICD-10-CM | POA: Diagnosis not present

## 2016-09-11 LAB — POCT INR: INR: 3.6

## 2016-09-11 MED ORDER — ENOXAPARIN SODIUM 80 MG/0.8ML ~~LOC~~ SOLN: 80.0000 mg | Freq: Two times a day (BID) | SUBCUTANEOUS | 1 refills | Status: DC

## 2016-09-11 NOTE — Patient Instructions (Addendum)
09/23/16: Last dose of Coumadin.  09/24/16: No Coumadin or Lovenox.  09/25/16: Inject Lovenox '80mg'$  in the fatty abdominal tissue at least 2 inches from the belly button twice a day about 12 hours apart, 8am and 8pm rotate sites. No Coumadin.  09/26/16: Inject Lovenox in the fatty tissue every 12 hours, 8am and 8pm. No Coumadin.  09/27/16: Inject Lovenox in the fatty tissue every 12 hours, 8am and 8pm. No Coumadin.  09/28/16: Inject Lovenox in the fatty tissue at [place]. No PM dose of Lovenox. No Coumadin.  09/29/16: Procedure Day #1. No Coumadin or Lovenox.  09/30/16: Inject Lovenox in the fatty tissue in the morning at 8 am (No PM dose). No Coumadin.  10/01/16: Procedure Day #2. No Lovenox - Resume Coumadin in the evening or as directed by doctor (take an extra half tablet with usual dose for 2 days then resume normal dose).  10/02/16: Resume Lovenox inject in the fatty tissue every 12 hours and take Coumadin.  10/03/16: Inject Lovenox in the fatty tissue every 12 hours and take Coumadin.  10/04/16: Inject Lovenox in the fatty tissue every 12 hours and take Coumadin.  10/05/16: Inject Lovenox in the fatty tissue every 12 hours and take Coumadin.  10/06/16: Inject Lovenox in the fatty tissue every 12 hours and take Coumadin. Coumadin appt to check INR.

## 2016-09-20 ENCOUNTER — Other Ambulatory Visit: Payer: Self-pay | Admitting: Cardiovascular Disease

## 2016-09-23 ENCOUNTER — Other Ambulatory Visit: Payer: Self-pay | Admitting: Cardiovascular Disease

## 2016-09-23 ENCOUNTER — Telehealth: Payer: Self-pay | Admitting: Gastroenterology

## 2016-09-23 NOTE — Telephone Encounter (Signed)
I don't think there are threshold BP numbers for the [place] as it depends on the circumstances-is it being treatment, is it causing symptoms, is it new or chronic, etc. If it is very high, new, pt is not on medication or it is causing symptoms those situations might lead [place] to cancel the procedure. If the values in the message are his typical BP readings that would not be a problem. We would be concerned about BPs greater than around 200/100 however this does not mean we would cancel. Please check with [facility] staff to see how they view this.

## 2016-09-23 NOTE — Telephone Encounter (Signed)
Dr. Fuller Plan and Lanelle Bal patient wants to know what the threshold for BP is before you will cancel his procedure? He is asking for numbers?

## 2016-09-24 NOTE — Telephone Encounter (Signed)
Agree with Dr. Fuller Plan.  It will most likely depend on the number and the circumstances.  I will forward this to Osvaldo Angst to give him a heads up.

## 2016-09-24 NOTE — Telephone Encounter (Signed)
Lanelle Bal,  I reviewed his chart and gave Elizabeth Palau a heads up. He is fine for [facility] and no problem since his BP is managed  Thanks,  Jenny Reichmann

## 2016-09-24 NOTE — Telephone Encounter (Signed)
Left message for patient to call back  

## 2016-09-24 NOTE — Telephone Encounter (Signed)
Patient notified

## 2016-09-24 NOTE — Telephone Encounter (Signed)
REFILL 

## 2016-09-28 DIAGNOSIS — Z79899 Other long term (current) drug therapy: Secondary | ICD-10-CM | POA: Diagnosis not present

## 2016-09-28 DIAGNOSIS — I739 Peripheral vascular disease, unspecified: Secondary | ICD-10-CM | POA: Diagnosis not present

## 2016-09-28 DIAGNOSIS — Z7901 Long term (current) use of anticoagulants: Secondary | ICD-10-CM | POA: Diagnosis not present

## 2016-09-28 DIAGNOSIS — Z01818 Encounter for other preprocedural examination: Secondary | ICD-10-CM | POA: Diagnosis not present

## 2016-09-29 ENCOUNTER — Ambulatory Visit (AMBULATORY_SURGERY_CENTER): Payer: Medicare Other | Admitting: Gastroenterology

## 2016-09-29 ENCOUNTER — Encounter: Payer: Self-pay | Admitting: Gastroenterology

## 2016-09-29 VITALS — BP 99/80 | HR 68 | Temp 97.3°F | Resp 17 | Ht 68.0 in | Wt 176.0 lb

## 2016-09-29 DIAGNOSIS — R197 Diarrhea, unspecified: Secondary | ICD-10-CM

## 2016-09-29 DIAGNOSIS — Z86010 Personal history of colonic polyps: Secondary | ICD-10-CM

## 2016-09-29 DIAGNOSIS — D126 Benign neoplasm of colon, unspecified: Secondary | ICD-10-CM

## 2016-09-29 DIAGNOSIS — D122 Benign neoplasm of ascending colon: Secondary | ICD-10-CM | POA: Diagnosis not present

## 2016-09-29 DIAGNOSIS — D125 Benign neoplasm of sigmoid colon: Secondary | ICD-10-CM | POA: Diagnosis not present

## 2016-09-29 DIAGNOSIS — R1013 Epigastric pain: Secondary | ICD-10-CM

## 2016-09-29 DIAGNOSIS — K635 Polyp of colon: Secondary | ICD-10-CM | POA: Diagnosis not present

## 2016-09-29 DIAGNOSIS — D124 Benign neoplasm of descending colon: Secondary | ICD-10-CM

## 2016-09-29 DIAGNOSIS — K2970 Gastritis, unspecified, without bleeding: Secondary | ICD-10-CM | POA: Diagnosis not present

## 2016-09-29 DIAGNOSIS — K921 Melena: Secondary | ICD-10-CM | POA: Diagnosis not present

## 2016-09-29 DIAGNOSIS — K2951 Unspecified chronic gastritis with bleeding: Secondary | ICD-10-CM | POA: Diagnosis not present

## 2016-09-29 DIAGNOSIS — D649 Anemia, unspecified: Secondary | ICD-10-CM | POA: Diagnosis not present

## 2016-09-29 DIAGNOSIS — I10 Essential (primary) hypertension: Secondary | ICD-10-CM | POA: Diagnosis not present

## 2016-09-29 DIAGNOSIS — Z8673 Personal history of transient ischemic attack (TIA), and cerebral infarction without residual deficits: Secondary | ICD-10-CM | POA: Diagnosis not present

## 2016-09-29 LAB — CBC WITH DIFFERENTIAL/PLATELET
BASOS PCT: 1 %
Basophils Absolute: 50 cells/uL (ref 0–200)
EOS PCT: 1 %
Eosinophils Absolute: 50 cells/uL (ref 15–500)
HCT: 47.5 % (ref 38.5–50.0)
HEMOGLOBIN: 16.2 g/dL (ref 13.2–17.1)
LYMPHS ABS: 1400 {cells}/uL (ref 850–3900)
LYMPHS PCT: 28 %
MCH: 32 pg (ref 27.0–33.0)
MCHC: 34.1 g/dL (ref 32.0–36.0)
MCV: 93.7 fL (ref 80.0–100.0)
MPV: 11.9 fL (ref 7.5–12.5)
Monocytes Absolute: 650 cells/uL (ref 200–950)
Monocytes Relative: 13 %
NEUTROS PCT: 57 %
Neutro Abs: 2850 cells/uL (ref 1500–7800)
Platelets: 244 10*3/uL (ref 140–400)
RBC: 5.07 MIL/uL (ref 4.20–5.80)
RDW: 13.5 % (ref 11.0–15.0)
WBC: 5 10*3/uL (ref 3.8–10.8)

## 2016-09-29 LAB — BASIC METABOLIC PANEL
BUN: 14 mg/dL (ref 7–25)
CHLORIDE: 102 mmol/L (ref 98–110)
CO2: 30 mmol/L (ref 20–31)
Calcium: 9.2 mg/dL (ref 8.6–10.3)
Creat: 0.74 mg/dL (ref 0.70–1.18)
Glucose, Bld: 151 mg/dL — ABNORMAL HIGH (ref 65–99)
POTASSIUM: 4.6 mmol/L (ref 3.5–5.3)
SODIUM: 141 mmol/L (ref 135–146)

## 2016-09-29 LAB — APTT: APTT: 31 s (ref 22–34)

## 2016-09-29 LAB — PROTIME-INR
INR: 1.1
Prothrombin Time: 11.7 s — ABNORMAL HIGH (ref 9.0–11.5)

## 2016-09-29 LAB — GLUCOSE, CAPILLARY
GLUCOSE-CAPILLARY: 111 mg/dL — AB (ref 65–99)
Glucose-Capillary: 84 mg/dL (ref 65–99)

## 2016-09-29 MED ORDER — SODIUM CHLORIDE 0.9 % IV SOLN: 500.0000 mL | INTRAVENOUS | Status: DC

## 2016-09-29 NOTE — Progress Notes (Signed)
Report given to PACU RN, vss 

## 2016-09-29 NOTE — Op Note (Signed)
[facility] Patient Name: [name] [name] Procedure Date: 09/29/2016 2:23 PM MRN: [id number] Endoscopist: Ladene Artist , MD Age: 70 Referring MD:  Date of Birth: [birth year] Gender: Male Account #: [id number] Procedure:                Upper GI endoscopy Indications:              Epigastric abdominal pain, Melena, Diarrhea Medicines:                Monitored Anesthesia Care Procedure:                Pre-Anesthesia Assessment:                           - Prior to the procedure, a History and Physical                            was performed, and patient medications and                            allergies were reviewed. The patient's tolerance of                            previous anesthesia was also reviewed. The risks                            and benefits of the procedure and the sedation                            options and risks were discussed with the patient.                            All questions were answered, and informed consent                            was obtained. Prior Anticoagulants: The patient                            last took Coumadin (warfarin) 5 days and Lovenox                            (enoxaparin) 1 day prior to the procedure. ASA                            Grade Assessment: III - A patient with severe                            systemic disease. After reviewing the risks and                            benefits, the patient was deemed in satisfactory                            condition to undergo the procedure.  After obtaining informed consent, the endoscope was                            passed under direct vision. Throughout the                            procedure, the patient's blood pressure, pulse, and                            oxygen saturations were monitored continuously. The                            Model [id number]-[id number] scope was introduced                            through the mouth, and  advanced to the second part                            of duodenum. The upper GI endoscopy was                            accomplished without difficulty. The patient                            tolerated the procedure well. Scope In: Scope Out: Findings:                 The examined esophagus was normal.                           Localized mild inflammation characterized by                            friability and granularity was found in the gastric                            antrum and in the prepyloric region of the stomach.                            Biopsies were taken with a cold forceps for                            histology.                           The exam of the stomach was otherwise normal.                           The duodenal bulb and second portion of the                            duodenum were normal. Biopsies for histology were                            taken with a cold forceps for  evaluation of celiac                            disease. Complications:            No immediate complications. Estimated Blood Loss:     Estimated blood loss was minimal. Impression:               - Normal esophagus.                           - Gastritis. Biopsied.                           - Normal duodenal bulb and second portion of the                            duodenum. Biopsied. Recommendation:           - Patient has a contact number available for                            emergencies. The signs and symptoms of potential                            delayed complications were discussed with the                            patient. Return to normal activities tomorrow.                            Written discharge instructions were provided to the                            patient.                           - Resume previous diet.                           - Resume Coumadin (warfarin) in 3 days and Lovenox                            (enoxaparin) today at prior doses. Refer to                             managing physician for further adjustment of                            therapy.                           - Await pathology results.                           - Continue present medications. Ladene Artist, MD 09/29/2016 3:19:11 PM This report has been signed electronically.

## 2016-09-29 NOTE — Progress Notes (Signed)
Called to room to assist during endoscopic procedure.  Patient ID and intended procedure confirmed with present staff. Received instructions for my participation in the procedure from the performing physician.  

## 2016-09-29 NOTE — Progress Notes (Signed)
Patient took diabetic medications his am. Blood glucose 111. Informed Dr. Fuller Plan, CRNA and recovery nurse.

## 2016-09-29 NOTE — Patient Instructions (Signed)
Impression/Recommendations:  Polyp handout given to patient. Hemorrhoid handout given to patient. Diverticulosis handout given to patient. Gastritis handout given to patient.  Repeat colonoscopy in 5 years for surveillance. Resume Coumadin (warfarin) in 3 days  (on Friday) and Lovenox today.  YOU HAD AN ENDOSCOPIC PROCEDURE TODAY AT [facility]:   Refer to the procedure report that was given to you for any specific questions about what was found during the examination.  If the procedure report does not answer your questions, please call your gastroenterologist to clarify.  If you requested that your care partner not be given the details of your procedure findings, then the procedure report has been included in a sealed envelope for you to review at your convenience later.  YOU SHOULD EXPECT: Some feelings of bloating in the abdomen. Passage of more gas than usual.  Walking can help get rid of the air that was put into your GI tract during the procedure and reduce the bloating. If you had a lower endoscopy (such as a colonoscopy or flexible sigmoidoscopy) you may notice spotting of blood in your stool or on the toilet paper. If you underwent a bowel prep for your procedure, you may not have a normal bowel movement for a few days.  Please Note:  You might notice some irritation and congestion in your nose or some drainage.  This is from the oxygen used during your procedure.  There is no need for concern and it should clear up in a day or so.  SYMPTOMS TO REPORT IMMEDIATELY:   Following lower endoscopy (colonoscopy or flexible sigmoidoscopy):  Excessive amounts of blood in the stool  Significant tenderness or worsening of abdominal pains  Swelling of the abdomen that is new, acute  Fever of 100F or higher   Following upper endoscopy (EGD)  Vomiting of blood or coffee ground material  New chest pain or pain under the shoulder blades  Painful or persistently difficult  swallowing  New shortness of breath  Fever of 100F or higher  Black, tarry-looking stools  For urgent or emergent issues, a gastroenterologist can be reached at any hour by calling [phone].   DIET:  We do recommend a small meal at first, but then you may proceed to your regular diet.  Drink plenty of fluids but you should avoid alcoholic beverages for 24 hours.  ACTIVITY:  You should plan to take it easy for the rest of today and you should NOT DRIVE or use heavy machinery until tomorrow (because of the sedation medicines used during the test).    FOLLOW UP: Our staff will call the number listed on your records the next business day following your procedure to check on you and address any questions or concerns that you may have regarding the information given to you following your procedure. If we do not reach you, we will leave a message.  However, if you are feeling well and you are not experiencing any problems, there is no need to return our call.  We will assume that you have returned to your regular daily activities without incident.  If any biopsies were taken you will be contacted by phone or by letter within the next 1-3 weeks.  Please call us at [phone] if you have not heard about the biopsies in 3 weeks.    SIGNATURES/CONFIDENTIALITY: You and/or your care partner have signed paperwork which will be entered into your electronic medical record.  These signatures attest to the fact that  that the information above on your After Visit Summary has been reviewed and is understood.  Full responsibility of the confidentiality of this discharge information lies with you and/or your care-partner.

## 2016-09-29 NOTE — Op Note (Addendum)
[facility] Patient Name: [name] [name] Procedure Date: 09/29/2016 2:23 PM MRN: [id number] Endoscopist: Ladene Artist , MD Age: 70 Referring MD:  Date of Birth: August 10, 1946 Gender: Male Account #: [id number] Procedure:                Colonoscopy Indications:              Clinically significant diarrhea of unexplained                            origin Medicines:                Monitored Anesthesia Care Procedure:                Pre-Anesthesia Assessment:                           - Prior to the procedure, a History and Physical                            was performed, and patient medications and                            allergies were reviewed. The patient's tolerance of                            previous anesthesia was also reviewed. The risks                            and benefits of the procedure and the sedation                            options and risks were discussed with the patient.                            All questions were answered, and informed consent                            was obtained. Prior Anticoagulants: The patient has                            taken Lovenox (enoxaparin), last dose was 1 day                            prior to procedure. Prior Anticoagulants: The                            patient has taken Coumadin (warfarin), last dose                            was 5 days prior to procedure. ASA Grade                            Assessment: III - A patient with severe systemic  disease. After reviewing the risks and benefits,                            the patient was deemed in satisfactory condition to                            undergo the procedure. After obtaining informed                            consent, the colonoscope was passed under direct                            vision. Throughout the procedure, the patient's                            blood pressure, pulse, and oxygen saturations were                  monitored continuously. The Model [id number]-[id number]                            [id number]) scope was introduced through the anus                            and advanced to the the terminal ileum. The                            terminal ileum, ileocecal valve, appendiceal                            orifice, and rectum were photographed. The quality                            of the bowel preparation was adequate after                            extensive lavage and suctioning. The colonoscopy                            was performed without difficulty. The patient                            tolerated the procedure well. Scope In: 2:39:15 PM Scope Out: 2:56:07 PM Scope Withdrawal Time: 0 hours 15 minutes 49 seconds  Total Procedure Duration: 0 hours 16 minutes 52 seconds  Findings:                 The perianal and digital rectal examinations were                            normal.                           Four sessile polyps were found in the sigmoid colon                            (  1), descending colon (2) and ascending colon (1).                            The polyps were 5 to 7 mm in size. These polyps                            were removed with a cold snare. Resection and                            retrieval were complete.                           Internal hemorrhoids were found during                            retroflexion. The hemorrhoids were small and Grade                            I (internal hemorrhoids that do not prolapse).                           Many medium-mouthed diverticula were found in the                            sigmoid colon, descending colon, transverse colon                            and ascending colon. There was evidence of                            diverticular spasm in the left colon. There was no                            evidence of diverticular bleeding.                           The exam was otherwise without abnormality on                             direct and retroflexion views. Random biposies                            obtained throughout the colon.                           The terminal ileum was intubate a short distance                            and appeared normal. Complications:            No immediate complications. Estimated blood loss:                            None. Estimated Blood Loss:     Estimated blood loss: none. Impression:               -  Four 5 to 7 mm polyps in the sigmoid colon, in                            the descending colon and at the appendiceal                            orifice, removed with a cold snare. Resected and                            retrieved.                           - Internal hemorrhoids.                           - Moderate diverticulosis in the sigmoid colon, in                            the descending colon, in the transverse colon and                            in the ascending colon.                           - Normal appearing distal TI                           - The examination was otherwise normal on direct                            and retroflexion views. Random biopsies obtained. Recommendation:           - Repeat colonoscopy in 5 years for surveillance                            with a more extensive bowel prep, if health status                            appropriate for surveillane.                           - Resume Coumadin (warfarin) in 3 days and Lovenox                            (enoxaparin) today at prior doses. Refer to                            managing physician for further adjustment of                            therapy.                           - Patient has a contact number available for  emergencies. The signs and symptoms of potential                            delayed complications were discussed with the                            patient. Return to normal activities tomorrow.                             Written discharge instructions were provided to the                            patient.                           - Resume previous diet.                           - Continue present medications.                           - Await pathology results. Ladene Artist, MD 09/29/2016 3:15:13 PM This report has been signed electronically.

## 2016-09-30 ENCOUNTER — Telehealth: Payer: Self-pay | Admitting: *Deleted

## 2016-09-30 NOTE — Telephone Encounter (Signed)
  Follow up Call-  Call back number 09/29/2016  Post procedure Call Back phone  # [phone]  Permission to leave phone message Yes  Some recent data might be hidden     Patient questions:  Do you have a fever, pain , or abdominal swelling? No. Pain Score  0 *  Have you tolerated food without any problems? Yes.    Have you been able to return to your normal activities? Yes.    Do you have any questions about your discharge instructions: Diet   No. Medications  No. Follow up visit  No.  Do you have questions or concerns about your Care? No.  Actions: * If pain score is 4 or above: No action needed, pain <4.

## 2016-10-01 ENCOUNTER — Encounter (HOSPITAL_COMMUNITY)
Admission: RE | Disposition: A | Discharge status: Home / Self Care | Payer: Self-pay | Source: Ambulatory Visit | Attending: Cardiovascular Disease

## 2016-10-01 ENCOUNTER — Ambulatory Visit (HOSPITAL_COMMUNITY)
Admission: RE | Admit: 2016-10-01 | Discharge: 2016-10-02 | Disposition: A | Discharge status: Home / Self Care | Payer: Medicare Other | Source: Ambulatory Visit | Attending: Cardiovascular Disease | Admitting: Cardiovascular Disease

## 2016-10-01 ENCOUNTER — Encounter (HOSPITAL_COMMUNITY): Payer: Self-pay | Admitting: *Deleted

## 2016-10-01 DIAGNOSIS — I739 Peripheral vascular disease, unspecified: Secondary | ICD-10-CM | POA: Diagnosis present

## 2016-10-01 DIAGNOSIS — I701 Atherosclerosis of renal artery: Secondary | ICD-10-CM | POA: Insufficient documentation

## 2016-10-01 DIAGNOSIS — Z952 Presence of prosthetic heart valve: Secondary | ICD-10-CM | POA: Diagnosis not present

## 2016-10-01 DIAGNOSIS — Z87891 Personal history of nicotine dependence: Secondary | ICD-10-CM | POA: Insufficient documentation

## 2016-10-01 DIAGNOSIS — I2510 Atherosclerotic heart disease of native coronary artery without angina pectoris: Secondary | ICD-10-CM | POA: Diagnosis not present

## 2016-10-01 DIAGNOSIS — Z8673 Personal history of transient ischemic attack (TIA), and cerebral infarction without residual deficits: Secondary | ICD-10-CM | POA: Insufficient documentation

## 2016-10-01 DIAGNOSIS — I10 Essential (primary) hypertension: Secondary | ICD-10-CM | POA: Insufficient documentation

## 2016-10-01 DIAGNOSIS — Z7901 Long term (current) use of anticoagulants: Secondary | ICD-10-CM | POA: Diagnosis not present

## 2016-10-01 DIAGNOSIS — E1151 Type 2 diabetes mellitus with diabetic peripheral angiopathy without gangrene: Secondary | ICD-10-CM | POA: Diagnosis not present

## 2016-10-01 DIAGNOSIS — I70211 Atherosclerosis of native arteries of extremities with intermittent claudication, right leg: Secondary | ICD-10-CM | POA: Diagnosis not present

## 2016-10-01 DIAGNOSIS — E785 Hyperlipidemia, unspecified: Secondary | ICD-10-CM | POA: Insufficient documentation

## 2016-10-01 DIAGNOSIS — R197 Diarrhea, unspecified: Secondary | ICD-10-CM

## 2016-10-01 DIAGNOSIS — R1013 Epigastric pain: Secondary | ICD-10-CM

## 2016-10-01 DIAGNOSIS — Z951 Presence of aortocoronary bypass graft: Secondary | ICD-10-CM | POA: Insufficient documentation

## 2016-10-01 HISTORY — DX: Type 2 diabetes mellitus without complications: E11.9

## 2016-10-01 HISTORY — PX: PERIPHERAL VASCULAR CATHETERIZATION: SHX172C

## 2016-10-01 LAB — PROTIME-INR
INR: 0.89
PROTHROMBIN TIME: 12 s (ref 11.4–15.2)

## 2016-10-01 LAB — GLUCOSE, CAPILLARY
GLUCOSE-CAPILLARY: 192 mg/dL — AB (ref 65–99)
GLUCOSE-CAPILLARY: 254 mg/dL — AB (ref 65–99)
GLUCOSE-CAPILLARY: 126 mg/dL — AB (ref 65–99)
Glucose-Capillary: 201 mg/dL — ABNORMAL HIGH (ref 65–99)

## 2016-10-01 LAB — POCT ACTIVATED CLOTTING TIME
Activated Clotting Time: 175 seconds
Activated Clotting Time: 213 seconds
Activated Clotting Time: 252 seconds
Activated Clotting Time: 230 seconds
Activated Clotting Time: 169 seconds

## 2016-10-01 SURGERY — PERIPHERAL VASCULAR INTERVENTION
Laterality: Right

## 2016-10-01 MED ORDER — FENTANYL CITRATE (PF) 100 MCG/2ML IJ SOLN
INTRAMUSCULAR | Status: DC | PRN
  Administered 2016-10-01 (×3): 25 ug via INTRAVENOUS

## 2016-10-01 MED ORDER — SODIUM CHLORIDE 0.9% FLUSH: 3.0000 mL | INTRAVENOUS | Status: DC | PRN

## 2016-10-01 MED ORDER — MORPHINE SULFATE (PF) 2 MG/ML IV SOLN: 2.0000 mg | INTRAVENOUS | Status: DC | PRN

## 2016-10-01 MED ORDER — METOPROLOL SUCCINATE ER 50 MG PO TB24
50.0000 mg | ORAL_TABLET | Freq: Every day | ORAL | Status: DC
  Administered 2016-10-02: 50 mg via ORAL
  Filled 2016-10-01 (×2): qty 1

## 2016-10-01 MED ORDER — INSULIN ASPART 100 UNIT/ML ~~LOC~~ SOLN
0.0000 [IU] | Freq: Three times a day (TID) | SUBCUTANEOUS | Status: DC
  Administered 2016-10-01: 8 [IU] via SUBCUTANEOUS
  Administered 2016-10-02: 2 [IU] via SUBCUTANEOUS

## 2016-10-01 MED ORDER — ASPIRIN 81 MG PO CHEW
81.0000 mg | CHEWABLE_TABLET | ORAL | Status: AC
  Administered 2016-10-01: 81 mg via ORAL

## 2016-10-01 MED ORDER — INSULIN ASPART 100 UNIT/ML ~~LOC~~ SOLN: 0.0000 [IU] | Freq: Three times a day (TID) | SUBCUTANEOUS | Status: DC

## 2016-10-01 MED ORDER — PANTOPRAZOLE SODIUM 40 MG PO TBEC
40.0000 mg | DELAYED_RELEASE_TABLET | Freq: Every day | ORAL | Status: DC
  Administered 2016-10-02: 40 mg via ORAL
  Filled 2016-10-01 (×2): qty 1

## 2016-10-01 MED ORDER — SODIUM CHLORIDE 0.9 % WEIGHT BASED INFUSION
3.0000 mL/kg/h | INTRAVENOUS | Status: DC
  Administered 2016-10-01: 3 mL/kg/h via INTRAVENOUS

## 2016-10-01 MED ORDER — HEPARIN (PORCINE) IN NACL 2-0.9 UNIT/ML-% IJ SOLN
INTRAMUSCULAR | Status: DC | PRN
  Administered 2016-10-01: 1000 mL

## 2016-10-01 MED ORDER — MIDAZOLAM HCL 2 MG/2ML IJ SOLN
INTRAMUSCULAR | Status: AC
  Filled 2016-10-01: qty 2

## 2016-10-01 MED ORDER — AMLODIPINE BESYLATE 10 MG PO TABS
10.0000 mg | ORAL_TABLET | Freq: Every day | ORAL | Status: DC
  Administered 2016-10-02: 10:00:00 10 mg via ORAL
  Filled 2016-10-01 (×2): qty 1

## 2016-10-01 MED ORDER — AMOXICILLIN 500 MG PO CAPS: 2000.0000 mg | ORAL_CAPSULE | ORAL | Status: DC

## 2016-10-01 MED ORDER — HYDRALAZINE HCL 20 MG/ML IJ SOLN: 10.0000 mg | INTRAMUSCULAR | Status: DC | PRN

## 2016-10-01 MED ORDER — ASPIRIN 81 MG PO CHEW
CHEWABLE_TABLET | ORAL | Status: AC
  Administered 2016-10-01: 81 mg via ORAL
  Filled 2016-10-01: qty 1

## 2016-10-01 MED ORDER — LIDOCAINE HCL (PF) 1 % IJ SOLN
INTRAMUSCULAR | Status: AC
  Filled 2016-10-01: qty 30

## 2016-10-01 MED ORDER — ANGIOPLASTY BOOK
Freq: Once | Status: AC
Start: 2016-10-01 — End: 2016-10-01
  Administered 2016-10-01: 22:00:00
  Filled 2016-10-01: qty 1

## 2016-10-01 MED ORDER — CANAGLIFLOZIN 100 MG PO TABS
100.0000 mg | ORAL_TABLET | Freq: Every day | ORAL | Status: DC
  Administered 2016-10-01 – 2016-10-02 (×2): 100 mg via ORAL
  Filled 2016-10-01 (×2): qty 1

## 2016-10-01 MED ORDER — SODIUM CHLORIDE 0.9 % IV SOLN
INTRAVENOUS | Status: DC
  Administered 2016-10-01: 10:00:00 via INTRAVENOUS

## 2016-10-01 MED ORDER — TRANDOLAPRIL 4 MG PO TABS
4.0000 mg | ORAL_TABLET | Freq: Every day | ORAL | Status: DC
  Filled 2016-10-01: qty 1

## 2016-10-01 MED ORDER — HEPARIN SODIUM (PORCINE) 1000 UNIT/ML IJ SOLN
INTRAMUSCULAR | Status: DC | PRN
  Administered 2016-10-01: 3000 [IU] via INTRAVENOUS
  Administered 2016-10-01: 4000 [IU] via INTRAVENOUS
  Administered 2016-10-01: 2000 [IU] via INTRAVENOUS
  Administered 2016-10-01: 5000 [IU] via INTRAVENOUS

## 2016-10-01 MED ORDER — LIDOCAINE HCL (PF) 1 % IJ SOLN
INTRAMUSCULAR | Status: DC | PRN
  Administered 2016-10-01: 10 mL
  Administered 2016-10-01: 28 mL

## 2016-10-01 MED ORDER — MELATONIN 3 MG PO TABS
2.0000 | ORAL_TABLET | Freq: Every day | ORAL | Status: DC
  Administered 2016-10-01: 6 mg via ORAL
  Filled 2016-10-01: qty 2

## 2016-10-01 MED ORDER — HEPARIN SODIUM (PORCINE) 1000 UNIT/ML IJ SOLN
INTRAMUSCULAR | Status: AC
  Filled 2016-10-01: qty 1

## 2016-10-01 MED ORDER — ENOXAPARIN SODIUM 80 MG/0.8ML ~~LOC~~ SOLN
80.0000 mg | Freq: Two times a day (BID) | SUBCUTANEOUS | Status: DC
  Administered 2016-10-02: 09:00:00 80 mg via SUBCUTANEOUS
  Filled 2016-10-01 (×2): qty 0.8

## 2016-10-01 MED ORDER — FENTANYL CITRATE (PF) 100 MCG/2ML IJ SOLN
INTRAMUSCULAR | Status: AC
  Filled 2016-10-01: qty 2

## 2016-10-01 MED ORDER — ATORVASTATIN CALCIUM 40 MG PO TABS
40.0000 mg | ORAL_TABLET | Freq: Every day | ORAL | Status: DC
  Administered 2016-10-02: 40 mg via ORAL
  Filled 2016-10-01 (×2): qty 1

## 2016-10-01 MED ORDER — HEPARIN (PORCINE) IN NACL 2-0.9 UNIT/ML-% IJ SOLN
INTRAMUSCULAR | Status: AC
  Filled 2016-10-01: qty 1000

## 2016-10-01 MED ORDER — ACETAMINOPHEN 325 MG PO TABS: 650.0000 mg | ORAL_TABLET | ORAL | Status: DC | PRN

## 2016-10-01 MED ORDER — SILDENAFIL CITRATE 20 MG PO TABS: 20.0000 mg | ORAL_TABLET | ORAL | Status: DC

## 2016-10-01 MED ORDER — MIDAZOLAM HCL 2 MG/2ML IJ SOLN
INTRAMUSCULAR | Status: DC | PRN
  Administered 2016-10-01 (×3): 1 mg via INTRAVENOUS

## 2016-10-01 MED ORDER — ONDANSETRON HCL 4 MG/2ML IJ SOLN: 4.0000 mg | Freq: Four times a day (QID) | INTRAMUSCULAR | Status: DC | PRN

## 2016-10-01 MED ORDER — GLYBURIDE 5 MG PO TABS
5.0000 mg | ORAL_TABLET | Freq: Every day | ORAL | Status: DC
  Administered 2016-10-02: 09:00:00 5 mg via ORAL
  Filled 2016-10-01: qty 1

## 2016-10-01 MED ORDER — SODIUM CHLORIDE 0.9 % IV SOLN: 500.0000 mL | INTRAVENOUS | Status: DC

## 2016-10-01 MED ORDER — CLOPIDOGREL BISULFATE 75 MG PO TABS
75.0000 mg | ORAL_TABLET | Freq: Every day | ORAL | Status: DC
  Administered 2016-10-02: 75 mg via ORAL
  Filled 2016-10-01 (×2): qty 1

## 2016-10-01 MED ORDER — TRANDOLAPRIL 2 MG PO TABS
2.0000 mg | ORAL_TABLET | Freq: Every day | ORAL | Status: DC
  Administered 2016-10-02: 10:00:00 2 mg via ORAL
  Filled 2016-10-01 (×2): qty 1

## 2016-10-01 MED ORDER — HYDROCHLOROTHIAZIDE 12.5 MG PO CAPS
12.5000 mg | ORAL_CAPSULE | Freq: Every day | ORAL | Status: DC
  Administered 2016-10-02: 10:00:00 12.5 mg via ORAL
  Filled 2016-10-01 (×2): qty 1

## 2016-10-01 MED ORDER — SODIUM CHLORIDE 0.9 % WEIGHT BASED INFUSION: 1.0000 mL/kg/h | INTRAVENOUS | Status: DC

## 2016-10-01 SURGICAL SUPPLY — 23 items
BALLN ARMADA 5X80X80 (BALLOONS) ×3
BALLN IN.PACT DCB 6X120 (BALLOONS) ×3
BALLN IN.PACT DCB 6X40 (BALLOONS) ×3
BALLN MUSTANG 4.0X20 75 (BALLOONS) ×3
BALLOON ARMADA 5X80X80 (BALLOONS) ×1 IMPLANT
BALLOON MUSTANG 4.0X20 75 (BALLOONS) ×1 IMPLANT
DCB IN.PACT 6X120 (BALLOONS) ×1 IMPLANT
DCB IN.PACT 6X40 (BALLOONS) ×1 IMPLANT
KIT ENCORE 26 ADVANTAGE (KITS) ×2 IMPLANT
KIT MICROINTRODUCER STIFF 5F (SHEATH) ×2 IMPLANT
KIT PV (KITS) ×3 IMPLANT
SHEATH PINNACLE 5F 10CM (SHEATH) ×2 IMPLANT
SHEATH PINNACLE 7F 10CM (SHEATH) ×2 IMPLANT
SHEATH PINNACLE 8F 10CM (SHEATH) ×2 IMPLANT
STENT ABSOLUTE PRO 7X60X135 (Permanent Stent) ×2 IMPLANT
STENT ZILVER PTX 7X60 (Permanent Stent) ×2 IMPLANT
TAPE RADIOPAQUE TURBO (MISCELLANEOUS) ×2 IMPLANT
TRANSDUCER W/STOPCOCK (MISCELLANEOUS) ×3 IMPLANT
TRAY PV CATH (CUSTOM PROCEDURE TRAY) ×3 IMPLANT
WIRE AMPLATZ SS-J .035X180CM (WIRE) IMPLANT
WIRE AMPLATZ SSTIFF .035X260CM (WIRE) ×2 IMPLANT
WIRE HI TORQ VERSACORE J 260CM (WIRE) ×2 IMPLANT
WIRE HITORQ VERSACORE ST 145CM (WIRE) ×2 IMPLANT

## 2016-10-01 NOTE — Progress Notes (Signed)
69f antegrade sheath aspirated and removed from RFA. Manual pressure applied for 20 minutes. Groin level 0.  Right dp pulse easily palpable. Right pt palpable but weak.  Tegaderm dressing applied, bedrest instructions given.  Bedrest begins at 12:00:00 noon.

## 2016-10-01 NOTE — Interval H&P Note (Signed)
History and Physical Interval Note:  10/01/2016 7:25 AM  [name] [name]  has presented today for surgery, with the diagnosis of claudication  The various methods of treatment have been discussed with the patient and family. After consideration of risks, benefits and other options for treatment, the patient has consented to  Procedure(s): Lower Extremity Angiography (N/A) as a surgical intervention .  The patient's history has been reviewed, patient examined, no change in status, stable for surgery.  I have reviewed the patient's chart and labs.  Questions were answered to the patient's satisfaction.     Quay Burow

## 2016-10-01 NOTE — H&P (View-Only) (Signed)
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 I last saw him in the office 07/22/16. He retired a little over 2 years ago having worked there for 25 years. His primary care physician is Dr. Virgina Jock. He was referred by Dr. Johnsie Cancel for peripheral vascular evaluation because of resistant hypertension with duplex evidence of bilateral renal artery stenosis as well as new-onset right calf claudication with recent Dopplers that showed a lesion in his mid right SFA. He does have a history of remote tobacco abuse as well as treated hypertension, hyperlipidemia and diabetes. He has had coronary bypass grafting and [facility] aVR performed by Dr. Cyndia Bent December 2004 on Coumadin anticoagulation. He has also had aortobifemoral bypass grafting, left external iliac stenting and left common femoral endarterectomy performed by Dr. Donnetta Hutching. I performed left renal artery stenting 02/21/15 with an excellent angiographic and clinical result. His blood pressure has been under better control. I then perform staged right SFA directional atherectomy 04/22/15 removing a high-grade eccentric calcified mid to distal right SFA plaque. He did have progression of disease at the origin of his right SFA. His claudication markedly improved after his procedure however since I saw on the year ago he's had some recurrent claudication. His blood pressures have been under better control since his renal artery intervention. Recent lower extremity Doppler studies performed 07/15/16 revealed a right ABI 0.34 with a new high-frequency signal in the origin of his right SFA.  I performed angiography 08/24/16 revealing a 95%  proximal right SFA stenosis, 80% mid right SFA stenosis and 80% calcified mid left SFA stenosis. In addition, he had 75% "in-stent restenosis" within the previously placed left renal artery stent. Intervention will be somewhat challenging given the proximity of the proximal right SFA stenosis to potential antegrade stick  Current Outpatient Prescriptions  Medication Sig Dispense Refill  . amLODipine (NORVASC) 10 MG tablet Take 10 mg by mouth daily.  5  . amoxicillin (AMOXIL) 500 MG capsule Take 2,000 mg by mouth See admin instructions. Will take 30-60 minutes prior to dental appointments  0  . atorvastatin (LIPITOR) 40 MG tablet Take 40 mg by mouth daily.     . canagliflozin (INVOKANA) 100 MG TABS tablet Take 100 mg by mouth daily.    [name] enoxaparin (LOVENOX) 80 MG/0.8ML injection Inject 0.8 mLs (80 mg total) into the skin every 12 (twelve) hours. (Patient taking differently: Inject 80 mg into the skin every 12 (twelve) hours. Will start prior to surgery per Dr) 20 Syringe 0  . glyBURIDE (DIABETA) 2.5 MG tablet Take 5 mg by mouth daily with breakfast.     . hydrochlorothiazide (MICROZIDE) 12.5 MG capsule TAKE ONE CAPSULE BY MOUTH EVERY DAY 30 capsule 2  . Hyoscyamine Sulfate SL (LEVSIN/SL) 0.125 MG SUBL Take 1-2 capsules by mouth before meals 120 each 11  . metoprolol succinate (TOPROL-XL) 50 MG 24 hr tablet Take 1 tablet (50 mg total) by mouth daily. Take with or immediately following a meal. 30 tablet 6  . pantoprazole (PROTONIX) 40 MG tablet Take 1 tablet (40 mg total) by mouth daily. 30 tablet 11  . sildenafil (REVATIO) 20 MG tablet Take 20 mg by mouth as directed.    [name]  trandolapril (MAVIK) 4 MG tablet Take 4 mg by mouth daily.    [name] warfarin (COUMADIN) 7.5 MG tablet TAKE AS DIRECTED BY COUMADIN CLINIC 40 tablet 3   No current facility-administered medications for this visit.     No Known Allergies  Social History   Social History  . Marital status: Divorced    Spouse name: N/A  . Number  of children: 3  . Years of education: N/A   Occupational History  . Retired    Social History Main Topics  . Smoking status: Former Smoker    Packs/day: 1.50    Years: 30.00    Types: Cigarettes    Quit date: 12/15/1993  . Smokeless tobacco: Never Used  . Alcohol use 7.0 oz/week    14 Standard drinks or equivalent per week     Comment: drinks 2 martini's a night  . Drug use: No  . Sexual activity: Yes   Other Topics Concern  . Not on file   Social History Narrative   Tries to remain active.  Frequent golfer but claudication limits this.     Review of Systems: General: negative for chills, fever, night sweats or weight changes.  Cardiovascular: negative for chest pain, dyspnea on exertion, edema, orthopnea, palpitations, paroxysmal nocturnal dyspnea or shortness of breath Dermatological: negative for rash Respiratory: negative for cough or wheezing Urologic: negative for hematuria Abdominal: negative for nausea, vomiting, diarrhea, bright red blood per rectum, melena, or hematemesis Neurologic: negative for visual changes, syncope, or dizziness All other systems reviewed and are otherwise negative except as noted above.    Blood pressure (!) 185/66, pulse 87, height '5\' 8"'$  (1.727 m), weight 176 lb 3.2 oz (79.9 kg), SpO2 93 %.  General appearance: alert and no distress Neck: no adenopathy, no JVD, supple, symmetrical, trachea midline, thyroid not enlarged, symmetric, no tenderness/mass/nodules and Soft bilateral carotid bruits versus transmitted murmur Lungs: clear to auscultation bilaterally Heart: Crisp prosthetic valve sounds Extremities: extremities normal, atraumatic, no cyanosis or edema  EKG normal sinus rhythm 87 with evidence of LVH with repolarization changes. I personally reviewed this EKG  ASSESSMENT AND PLAN:   PVD (peripheral vascular disease) with claudication  [facility] [name] [name] returns today for follow-up of his recent angiogram which I performed 08/24/16.  He has had remote aortobifemoral bypass grafting. I performed [facility] 1 directional atherectomy and drug-eluting balloon angioplasty of a high-grade right SFA stenosis 04/22/15 using an antegrade approach. He had recurrent claudication with Dopplers that showed a high-frequency signal in his proximal right SFA. He does have significant lifestyle limiting claudication. I angiogram to him revealing a 95% fairly focal proximal right SFA stenosis as well as a percent mid right SFA stenosis with three-vessel runoff. In addition, he had an 80% calcified focal mid left SFA stenosis. Approaching this is anatomically challenging because of the proximity of the stenosis to the aorto bifemoral anastomosis and 2 the groin. While this would be approachable via left brachial approach he's had bypass surgery and a LIMA graft making it less desirable. I believe I can access is right common femoral artery via an antegrade stick and have enough purchase to perform balloon angioplasty of his proximal right SFA which appears to be soft plaque allowing me to then to directional atherectomy and drug-eluting balloon angioplasty of the mid right SFA. I could potentially stent the origin of the right SFA on the way out. We've thoroughly discussed the approach the patient agrees to proceed. He'll need Lovenox bridging as he has done  in the past.      Lorretta Harp MD [facility] 09/08/2016 3:19 PM

## 2016-10-01 NOTE — Care Management Note (Signed)
Case Management Note  Patient Details  Name: Artie Mcintyre MRN: [id number] Date of Birth: 06-Feb-1946  Subjective/Objective:   S/p cath, future med on mar is plavix,  NCM will cont to follow for dc needs.                  Action/Plan:   Expected Discharge Date:                  Expected Discharge Plan:  Home/Self Care  In-House Referral:     Discharge planning Services  CM Consult  Post Acute Care Choice:    Choice offered to:     DME Arranged:    DME Agency:     HH Arranged:    HH Agency:     Status of Service:  Completed, signed off  If discussed at [employer] of Stay Meetings, dates discussed:    Additional Comments:  Zenon Mayo, RN 10/01/2016, 12:02 PM

## 2016-10-02 ENCOUNTER — Other Ambulatory Visit: Payer: Self-pay | Admitting: Cardiology

## 2016-10-02 ENCOUNTER — Other Ambulatory Visit: Payer: Self-pay | Admitting: Cardiovascular Disease

## 2016-10-02 ENCOUNTER — Encounter: Payer: Medicare Other | Admitting: Gastroenterology

## 2016-10-02 DIAGNOSIS — I739 Peripheral vascular disease, unspecified: Secondary | ICD-10-CM | POA: Diagnosis not present

## 2016-10-02 DIAGNOSIS — E1151 Type 2 diabetes mellitus with diabetic peripheral angiopathy without gangrene: Secondary | ICD-10-CM | POA: Diagnosis not present

## 2016-10-02 DIAGNOSIS — I701 Atherosclerosis of renal artery: Secondary | ICD-10-CM | POA: Diagnosis not present

## 2016-10-02 DIAGNOSIS — I2510 Atherosclerotic heart disease of native coronary artery without angina pectoris: Secondary | ICD-10-CM | POA: Diagnosis not present

## 2016-10-02 DIAGNOSIS — I70211 Atherosclerosis of native arteries of extremities with intermittent claudication, right leg: Secondary | ICD-10-CM | POA: Diagnosis not present

## 2016-10-02 DIAGNOSIS — I10 Essential (primary) hypertension: Secondary | ICD-10-CM | POA: Diagnosis not present

## 2016-10-02 DIAGNOSIS — E785 Hyperlipidemia, unspecified: Secondary | ICD-10-CM | POA: Diagnosis not present

## 2016-10-02 LAB — CBC
HEMATOCRIT: 44.4 % (ref 39.0–52.0)
HEMOGLOBIN: 14.7 g/dL (ref 13.0–17.0)
MCH: 31.6 pg (ref 26.0–34.0)
MCHC: 33.1 g/dL (ref 30.0–36.0)
MCV: 95.5 fL (ref 78.0–100.0)
Platelets: 174 10*3/uL (ref 150–400)
RBC: 4.65 MIL/uL (ref 4.22–5.81)
RDW: 14 % (ref 11.5–15.5)
WBC: 6 10*3/uL (ref 4.0–10.5)

## 2016-10-02 LAB — BASIC METABOLIC PANEL
ANION GAP: 10 (ref 5–15)
BUN: 8 mg/dL (ref 6–20)
CALCIUM: 8.7 mg/dL — AB (ref 8.9–10.3)
CO2: 25 mmol/L (ref 22–32)
Chloride: 102 mmol/L (ref 101–111)
Creatinine, Ser: 0.83 mg/dL (ref 0.61–1.24)
Glucose, Bld: 130 mg/dL — ABNORMAL HIGH (ref 65–99)
POTASSIUM: 3.8 mmol/L (ref 3.5–5.1)
Sodium: 137 mmol/L (ref 135–145)

## 2016-10-02 LAB — GLUCOSE, CAPILLARY: Glucose-Capillary: 143 mg/dL — ABNORMAL HIGH (ref 65–99)

## 2016-10-02 MED ORDER — TRANDOLAPRIL 4 MG PO TABS: 4.0000 mg | ORAL_TABLET | Freq: Every day | ORAL | 6 refills | Status: DC

## 2016-10-02 MED ORDER — CLOPIDOGREL BISULFATE 75 MG PO TABS: 75.0000 mg | ORAL_TABLET | Freq: Every day | ORAL | 12 refills | Status: DC

## 2016-10-02 MED ORDER — CLOPIDOGREL BISULFATE 75 MG PO TABS: 75.0000 mg | ORAL_TABLET | Freq: Every day | ORAL | 6 refills | Status: DC

## 2016-10-02 MED ORDER — TRANDOLAPRIL 2 MG PO TABS: 2.0000 mg | ORAL_TABLET | Freq: Every day | ORAL | 12 refills | Status: DC

## 2016-10-02 NOTE — Discharge Summary (Signed)
Discharge Summary    Patient ID: [name] [name],  MRN: [id number], DOB/AGE: [birth year] 70 y.o.  Admit date: 10/01/2016 Discharge date: 10/02/2016  Primary Care Provider: Precious Reel Primary Cardiologist: Dr. Gwenlyn Found   Discharge Diagnoses    Active Problems:   Peripheral vascular disease ([place])   Claudication [facility])   Allergies No Known Allergies  Diagnostic Studies/Procedures  Peripheral Vascular Intervention - STENT 10/01/16 Procedure Description: I was able to get a 7 French sheath into the right common femoral artery through the hood of the aortobifem in an antegrade fashion. I then performed angiography revealing the high-grade proximal stenosis which was inclusive with the sheath. The patient received 40,000 units of heparin intravenously and ACT of 2:30. Total contrast administered to the patient was 85 mL. I pulled the sheath back slowly just proximal to the lesion where I saw away form and tried to deploy a Zilver stent after predilatation with a 4 x 2 balloon. Unfortunately it is over sent did not cover the lesion although there was a filling defect presumably thrombus that it did drop. An absolute Pro was also deployed but unfortunately did not reach across the proximal SFA lesion. This was ballooned with a 6 mm x 40 mm Adm. drug-eluting balloon reducing a 95% stenosis to 0% residual with a small nonflow limiting dissection. Following this I dilated the mid right SFA with a 5 mm x 80 mm balloon and performed drug-eluting balloon angioplasty with a 6 mm x 120 mm long Adm. drug-eluting balloon at nominal pressures for 20 half minutes resulting reduction of a 95% stenosis to 0% residual. The patient tolerated the procedure well. The first core was removed and the sheath was secured. The patient left the lab in stable condition. He did receive 300 mg of by mouth Plavix because of the implanted stents.  Final Impression: Successful drug-eluting balloon angioplasty of the near  ostial/proximal and mid right SFA using antegrade puncture approach. Sheath will be removed and pressure held. The patient rehydrated overnight. He will resume his Lovenox bridge to Coumadin anticoagulation because of his St. Jude aortic valve. He will need to be on Plavix for at least 3-6 months in addition to Coumadin after which the Plavix can be discontinued. He can be discharged home tomorrow as an outpatient. He will need lower extremity arterial Doppler studies in our [facility] office next week. I will see him back to 3 weeks thereafter.   _____________   History of Present Illness     [name] [name] is a 70 year old male with a past medical history of DM, TIA, PAD, HTN, CAD s/p CABG, HLD,and AVR with mechanical AVR on chronic coumadin. He was seen in the office by Dr. Gwenlyn Found on 09/08/16 and was referred for stenting of the origin of the right SFA.  He has had aortobifemoral bypass grafting, left external iliac stenting and left common femoral endarterectomy performed by Dr. Donnetta Hutching in Feb. 2013. Also with history of renal artery stenting by Dr. Gwenlyn Found in 02/2015 and has had a better BP since.   He underwent staged right SFA directional atherectomy on 04/22/15 removing high grade calcified mid to distal right SFA plaque. He initially had great results, but developed some recurrent claudication and lower extremity Doppler studies performed 07/15/16 revealed a right ABI 0.34 with a new high-frequency signal in the origin of his right SFA.   He had angiography on 08/24/16 that showed a 95% proximal right SFA stenosis, 80% mid right SFA stenosis  and 80% calcified mid left SFA stenosis. He was scheduled for intervention on 10/01/16.   Hospital Course  See PV report above. He had successful drug-eluting balloon angioplasty of the near ostial/proximal and mid right SFA using antegrade puncture approach.  He was stable post procedure, and his right femoral site was stable without hematoma.   With his history  of AVR with mechanical valve, he will complete his Lovenox bridge to Coumadin. The patient verbalizes that he understands the instructions for Lovenox to Coumadin bridge and will follow up with the Coumadin clinic on 10/06/16.   He will need to be on Plavix for 3-6 months in addition to coumadin, after which the Plavix can be discontinued. He will have follow up arterial dopplers and ABI's in the [facility] office next week and will see Dr. Gwenlyn Found 2-3 weeks thereafter.   He was seen today by Dr. Martinique and deemed suitable for discharge.    _____________  Discharge Vitals Blood pressure (!) 167/30, pulse 73, temperature (!) 96.9 F (36.1 C), temperature source Axillary, resp. rate 19, height '5\' 8"'$  (1.727 m), weight 177 lb 0.5 oz (80.3 kg), SpO2 92 %.  Filed Weights   10/01/16 0548 10/02/16 0345  Weight: 175 lb (79.4 kg) 177 lb 0.5 oz (80.3 kg)    Labs & Radiologic Studies     CBC  Recent Labs  10/02/16 0432  WBC 6.0  HGB 14.7  HCT 44.4  MCV 95.5  PLT 798   Basic Metabolic Panel  Recent Labs  10/02/16 0432  NA 137  K 3.8  CL 102  CO2 25  GLUCOSE 130*  BUN 8  CREATININE 0.83  CALCIUM 8.7*     Disposition   Pt is being discharged home today in good condition.  Follow-up Plans & Appointments    Follow-up Information    [facility] Follow up on 10/08/2016.   Specialty:  Cardiology Why:  at 12:30pm for follow up doppler studies.  Contact information: [address] [place]       Quay Burow, MD .   Specialties:  Cardiology, Radiology Why:  Our office will call you to schedule an appt. with Dr. Gwenlyn Found. Contact information: [address] [place] [address] [phone]          Discharge Instructions    Diet - low sodium heart healthy    Complete by:  As directed    Discharge instructions    Complete by:  As directed    Groin Site Care Refer to this sheet in the  next few weeks. These instructions provide you with information on caring for yourself after your procedure. Your caregiver may also give you more specific instructions. Your treatment has been planned according to current medical practices, but problems sometimes occur. Call your caregiver if you have any problems or questions after your procedure. HOME CARE INSTRUCTIONS You may shower 24 hours after the procedure. Remove the bandage (dressing) and gently wash the site with plain soap and water. Gently pat the site dry.  Do not apply powder or lotion to the site.  Do not sit in a bathtub, swimming pool, or whirlpool for 5 to 7 days.  No bending, squatting, or lifting anything over 10 pounds (4.5 kg) as directed by your caregiver.  Inspect the site at least twice daily.  Do not drive home if you are discharged the same day of the procedure. Have someone else drive you.  You  may drive 24 hours after the procedure unless otherwise instructed by your caregiver.  What to expect: Any bruising will usually fade within 1 to 2 weeks.  Blood that collects in the tissue (hematoma) may be painful to the touch. It should usually decrease in size and tenderness within 1 to 2 weeks.  SEEK IMMEDIATE MEDICAL CARE IF: You have unusual pain at the groin site or down the affected leg.  You have redness, warmth, swelling, or pain at the groin site.  You have drainage (other than a small amount of blood on the dressing).  You have chills.  You have a fever or persistent symptoms for more than 72 hours.  You have a fever and your symptoms suddenly get worse.  Your leg becomes pale, cool, tingly, or numb.  You have heavy bleeding from the site. Hold pressure on the site. .   Increase activity slowly    Complete by:  As directed       Discharge Medications   Current Discharge Medication List    START taking these medications   Details  clopidogrel (PLAVIX) 75 MG tablet Take 1 tablet (75 mg total) by mouth  daily with breakfast. Qty: 30 tablet, Refills: 6      CONTINUE these medications which have CHANGED   Details  trandolapril (MAVIK) 4 MG tablet Take 1 tablet (4 mg total) by mouth daily. Qty: 30 tablet, Refills: 6      CONTINUE these medications which have NOT CHANGED   Details  amLODipine (NORVASC) 10 MG tablet Take 10 mg by mouth daily. Refills: 5    amoxicillin (AMOXIL) 500 MG capsule Take 2,000 mg by mouth See admin instructions. Will take 30-60 minutes prior to dental appointments Refills: 0    atorvastatin (LIPITOR) 40 MG tablet Take 40 mg by mouth daily.     canagliflozin (INVOKANA) 100 MG TABS tablet Take 100 mg by mouth daily.    enoxaparin (LOVENOX) 80 MG/0.8ML injection Inject 0.8 mLs (80 mg total) into the skin every 12 (twelve) hours. Take as instructed by Coumadin Clinic Qty: 10 Syringe, Refills: 1    glyBURIDE (DIABETA) 2.5 MG tablet Take 5 mg by mouth daily with breakfast.     hydrochlorothiazide (MICROZIDE) 12.5 MG capsule TAKE ONE CAPSULE BY MOUTH EVERY DAY Qty: 30 capsule, Refills: 5    Hyoscyamine Sulfate SL (LEVSIN/SL) 0.125 MG SUBL Take 1-2 capsules by mouth before meals Qty: 120 each, Refills: 11    Melatonin 5 MG TABS Take 1 tablet by mouth at bedtime.    metoprolol succinate (TOPROL-XL) 50 MG 24 hr tablet Take 1 tablet (50 mg total) by mouth daily. Take with or immediately following a meal. Qty: 30 tablet, Refills: 6    pantoprazole (PROTONIX) 40 MG tablet Take 1 tablet (40 mg total) by mouth daily. Qty: 30 tablet, Refills: 11    warfarin (COUMADIN) 7.5 MG tablet TAKE AS DIRECTED BY COUMADIN CLINIC Qty: 40 tablet, Refills: 3    sildenafil (REVATIO) 20 MG tablet Take 20 mg by mouth as directed.            Outstanding Labs/Studies     Duration of Discharge Encounter   Greater than 30 minutes including physician time.  Signed, Arbutus Leas NP 10/02/2016, 12:20 PM

## 2016-10-02 NOTE — Progress Notes (Signed)
art

## 2016-10-02 NOTE — Progress Notes (Signed)
Patient Name: [name] [name] Date of Encounter: 10/02/2016  Primary Cardiologist: Dr. Gwenlyn Found, Dr. [facility] Problem List     Active Problems:   Peripheral vascular disease [facility])   Claudication [facility])     Subjective   Feels well, his right leg feels better.   Inpatient Medications    Scheduled Meds: . amLODipine  10 mg Oral Daily  . atorvastatin  40 mg Oral Daily  . canagliflozin  100 mg Oral Daily  . clopidogrel  75 mg Oral Q breakfast  . enoxaparin  80 mg Subcutaneous Q12H  . glyBURIDE  5 mg Oral Q breakfast  . hydrochlorothiazide  12.5 mg Oral Daily  . insulin aspart  0-15 Units Subcutaneous TID WC  . Melatonin  2 tablet Oral QHS  . metoprolol succinate  50 mg Oral Daily  . pantoprazole  40 mg Oral Daily  . trandolapril  2 mg Oral Daily   Continuous Infusions: . sodium chloride     PRN Meds: acetaminophen, hydrALAZINE, morphine injection, ondansetron (ZOFRAN) IV   Vital Signs    Vitals:   10/01/16 1500 10/01/16 1537 10/01/16 1913 10/02/16 0345  BP: (!) 141/37 (!) 151/43 (!) 151/30 (!) 151/25  Pulse: 67 63 68 70  Resp: 20 15 (!) 21 14  Temp:  98.6 F (37 C) 98.7 F (37.1 C) (!) 96.9 F (36.1 C)  TempSrc:  Oral Oral Axillary  SpO2: 92% 91% 94% 91%  Weight:    177 lb 0.5 oz (80.3 kg)  Height:        Intake/Output Summary (Last 24 hours) at 10/02/16 0811 Last data filed at 10/02/16 0130  Gross per 24 hour  Intake             1635 ml  Output             2400 ml  Net             -765 ml   Filed Weights   10/01/16 0548 10/02/16 0345  Weight: 175 lb (79.4 kg) 177 lb 0.5 oz (80.3 kg)    Physical Exam    GEN: Well nourished, well developed, in no acute distress.  HEENT: Grossly normal.  Neck: Supple, no JVD, carotid bruits, or masses. Cardiac: RRR, no murmurs, rubs, or gallops. No clubbing, cyanosis, edema.  Radials/DP/PT 2+ and equal bilaterally.  Respiratory:  Respirations regular and unlabored, clear to auscultation  bilaterally. GI: Soft, nontender, nondistended, BS + x 4. MS: no deformity or atrophy. Skin: warm and dry, no rash. Neuro:  Strength and sensation are intact. Psych: AAOx3.  Normal affect.  Labs    CBC  Recent Labs  10/02/16 0432  WBC 6.0  HGB 14.7  HCT 44.4  MCV 95.5  PLT 400   Basic Metabolic Panel  Recent Labs  10/02/16 0432  NA 137  K 3.8  CL 102  CO2 25  GLUCOSE 130*  BUN 8  CREATININE 0.83  CALCIUM 8.7*     Telemetry    NSR- Personally Reviewed  ECG    NSR- Personally Reviewed  Radiology    No results found.  Cardiac Studies   Peripheral Vascular Intervention - STENT Final Impression: Successful drug-eluting balloon angioplasty of the near ostial/proximal and mid right SFA using antegrade puncture approach. Sheath will be removed and pressure held. The patient rehydrated overnight. He will resume his Lovenox bridge to Coumadin anticoagulation because of his St. Jude aortic valve. He will need to be on Plavix for  at least 3-6 months in addition to Coumadin after which the Plavix can be discontinued. He can be discharged home tomorrow as an outpatient. He will need lower extremity arterial Doppler studies in our [facility] office next week. I will see him back to 3 weeks thereafter.   Patient Profile     Mr. Crevier is a 70 year old male with past medical history of HTN, HLD, tobacco abuse, and diabetes. Underwent successful drug eluding balloon angioplasty of the near ostial/proximal and mid right SFA using antegrade puncture approach.  Assessment & Plan    1. Peripheral arterial disease: s/p drug eluding balloon angioplasty of ostial and mid right SFA. He will resume his Lovenox bridge to Coumadin anticoagulation because of his St. Jude aortic valve. He will need to be on Plavix for at least 3-6 months in addition to Coumadin after which the Plavix can be discontinued.  2. HTN: Has some elevated BP's, but for the most part well controlled. Continue  current regimen.   3. HLD: Continue atorvastatin.   Signed, Arbutus Leas, NP  10/02/2016, 8:11 AM  Patient seen and examined and history reviewed. Agree with above findings and plan. Feels well post intervention. Good valve click. No groin hematoma. Labs stable. For DC home today with bridging Lovenox.   Peter Martinique, [place] 10/02/2016 10:16 AM

## 2016-10-06 ENCOUNTER — Encounter (INDEPENDENT_AMBULATORY_CARE_PROVIDER_SITE_OTHER): Payer: Self-pay

## 2016-10-06 ENCOUNTER — Ambulatory Visit (INDEPENDENT_AMBULATORY_CARE_PROVIDER_SITE_OTHER): Payer: Medicare Other | Admitting: *Deleted

## 2016-10-06 DIAGNOSIS — I359 Nonrheumatic aortic valve disorder, unspecified: Secondary | ICD-10-CM

## 2016-10-06 DIAGNOSIS — G459 Transient cerebral ischemic attack, unspecified: Secondary | ICD-10-CM

## 2016-10-06 DIAGNOSIS — Z5181 Encounter for therapeutic drug level monitoring: Secondary | ICD-10-CM

## 2016-10-06 LAB — POCT INR: INR: 1.8

## 2016-10-08 ENCOUNTER — Ambulatory Visit (HOSPITAL_COMMUNITY)
Admission: RE | Admit: 2016-10-08 | Discharge: 2016-10-08 | Disposition: A | Discharge status: Home / Self Care | Payer: Medicare Other | Source: Ambulatory Visit | Attending: Cardiology | Admitting: Cardiology

## 2016-10-08 DIAGNOSIS — I739 Peripheral vascular disease, unspecified: Secondary | ICD-10-CM

## 2016-10-09 ENCOUNTER — Ambulatory Visit (INDEPENDENT_AMBULATORY_CARE_PROVIDER_SITE_OTHER): Payer: Medicare Other | Admitting: *Deleted

## 2016-10-09 ENCOUNTER — Telehealth: Payer: Self-pay | Admitting: Cardiovascular Disease

## 2016-10-09 DIAGNOSIS — I359 Nonrheumatic aortic valve disorder, unspecified: Secondary | ICD-10-CM | POA: Diagnosis not present

## 2016-10-09 DIAGNOSIS — G459 Transient cerebral ischemic attack, unspecified: Secondary | ICD-10-CM

## 2016-10-09 DIAGNOSIS — Z5181 Encounter for therapeutic drug level monitoring: Secondary | ICD-10-CM | POA: Diagnosis not present

## 2016-10-09 LAB — POCT INR: INR: 3.4

## 2016-10-09 NOTE — Telephone Encounter (Signed)
Closed encounter °

## 2016-10-14 ENCOUNTER — Telehealth: Payer: Self-pay | Admitting: *Deleted

## 2016-10-14 ENCOUNTER — Encounter: Payer: Self-pay | Admitting: Gastroenterology

## 2016-10-14 DIAGNOSIS — I739 Peripheral vascular disease, unspecified: Secondary | ICD-10-CM

## 2016-10-14 NOTE — Telephone Encounter (Signed)
-----   Message from Lorretta Harp, MD sent at 10/13/2016 12:48 AM EST ----- Improved. He is on the right status post proximal and mid right SFA intervention. Repeat Dopplers in 6 months

## 2016-10-21 ENCOUNTER — Encounter: Payer: Self-pay | Admitting: Cardiovascular Disease

## 2016-10-21 ENCOUNTER — Ambulatory Visit (INDEPENDENT_AMBULATORY_CARE_PROVIDER_SITE_OTHER): Payer: Medicare Other | Admitting: Cardiovascular Disease

## 2016-10-21 VITALS — BP 118/62 | HR 65 | Ht 68.0 in | Wt 171.4 lb

## 2016-10-21 DIAGNOSIS — I701 Atherosclerosis of renal artery: Secondary | ICD-10-CM

## 2016-10-21 DIAGNOSIS — I739 Peripheral vascular disease, unspecified: Secondary | ICD-10-CM | POA: Diagnosis not present

## 2016-10-21 NOTE — Assessment & Plan Note (Signed)
[name] returns today for postprocedure follow-up. He has had a remote aortobifemoral bypass graft and femoral endarterectomy by Dr. Donnetta Hutching. I intervened on his calcified mid right SFA 04/22/15 using directional atherectomy. He did well after that until recently when he had recurrent right lower extremity claudication and Dopplers showed a decrease in his right ABI to 0.34 with a new high-frequency signal in the origin of his right SFA as well as restenosis in his mid right SFA. I brought him back on 10/01/16 and performed drug-eluting angioplasty and stenting. An antegrade approach of his proximal right SFA as well as drug-eluting balloon angioplasty of his mid right SFA with excellent intragraft and clinical result. His Dopplers normalized and his claudication resolved. He was on the following day on Coumadin. He was playing golf every week. I will repeat check Dopplers every 6 months and we'll see him back in one year for follow-up.

## 2016-10-21 NOTE — Progress Notes (Signed)
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 I last saw him in the office 09/08/16. He retired a little over 2 years ago having worked there for 25 years. His primary care physician is Dr. Virgina Jock. He was referred by Dr. Johnsie Cancel for peripheral vascular evaluation because of resistant hypertension with duplex evidence of bilateral renal artery stenosis as well as new-onset right calf claudication with recent Dopplers that showed a lesion in his mid right SFA. He does have a history of remote tobacco abuse as well as treated hypertension, hyperlipidemia and diabetes. He has had coronary bypass grafting and St. Jude aVR performed by Dr. Cyndia Bent December 2004 on Coumadin anticoagulation. He has also had aortobifemoral bypass grafting, left external iliac stenting and left common femoral endarterectomy performed by Dr. Donnetta Hutching. I performed left renal artery stenting 02/21/15 with an excellent angiographic and clinical result. His blood pressure has been under better control. I then perform staged right SFA directional atherectomy 04/22/15 removing a high-grade eccentric calcified mid to distal right SFA plaque. He did have progression of disease at the origin of his right SFA. His claudication markedly improved after his procedure however since I saw on the year ago he's had some recurrent claudication. His blood pressures have been under better control since his renal artery intervention. Recent lower extremity Doppler studies performed 07/15/16 revealed a right ABI 0.34 with a new high-frequency signal in the origin of his right SFA. I performed angiography 08/24/16 revealing a  95% proximal right SFA stenosis, 80% mid right SFA stenosis and 80% calcified mid left SFA stenosis. In addition, he had 75% "in-stent restenosis" within the previously placed left renal artery stent. Intervention will be somewhat challenging given the proximity of the proximal right SFA stenosis . He underwent peripheral angiography intervention by myself 10/01/16. Right antegrade approach. I was able to stick the hood of his aortobifem and intrarenal in the proximal right SFA with drug-eluting [place] plasty and stents. I also performed drug-eluting plasty in the mid right SFA. His symptoms resolved after that and he is back playing golf. He was discharged on the following day. His Dopplers have normalized.   Current Outpatient Prescriptions  Medication Sig Dispense Refill  . amLODipine (NORVASC) 10 MG tablet Take 10 mg by mouth daily.  5  . amoxicillin (AMOXIL) 500 MG capsule Take 2,000 mg by mouth See admin instructions. Will take 30-60 minutes prior to dental appointments  0  . atorvastatin (LIPITOR) 40 MG tablet Take 40 mg by mouth daily.     . canagliflozin (INVOKANA) 100 MG TABS tablet Take 100 mg by mouth daily.    . clopidogrel (PLAVIX) 75 MG tablet Take 1 tablet (75 mg total) by mouth daily with breakfast. 30 tablet 6  . enoxaparin (LOVENOX) 80 MG/0.8ML injection Inject 0.8 mLs (80 mg total) into the skin every 12 (twelve) hours. Take as instructed by Coumadin Clinic 10 Syringe 1  . glyBURIDE (DIABETA) 2.5 MG tablet Take 5 mg by mouth daily with breakfast.     . hydrochlorothiazide (MICROZIDE) 12.5 MG capsule TAKE ONE CAPSULE BY MOUTH EVERY DAY 30 capsule 5  . Hyoscyamine Sulfate SL (LEVSIN/SL) 0.125 MG SUBL  Take 1-2 capsules by mouth before meals 120 each 11  . Melatonin 5 MG TABS Take 1 tablet by mouth at bedtime.    . metoprolol succinate (TOPROL-XL) 50 MG 24 hr tablet Take 1 tablet (50 mg total) by mouth daily. Take with or immediately following a meal. 30 tablet 6  . pantoprazole  (PROTONIX) 40 MG tablet Take 1 tablet (40 mg total) by mouth daily. 30 tablet 11  . sildenafil (REVATIO) 20 MG tablet Take 20 mg by mouth as directed.    . trandolapril (MAVIK) 4 MG tablet Take 1 tablet (4 mg total) by mouth daily. 30 tablet 6  . warfarin (COUMADIN) 7.5 MG tablet TAKE AS DIRECTED BY COUMADIN CLINIC 40 tablet 3   No current facility-administered medications for this visit.     No Known Allergies  Social History   Social History  . Marital status: Divorced    Spouse name: N/A  . Number of children: 3  . Years of education: N/A   Occupational History  . Retired    Social History Main Topics  . Smoking status: Former Smoker    Years: 30.00    Types: Cigarettes, Cigars    Quit date: 12/15/1993  . Smokeless tobacco: Never Used  . Alcohol use 25.2 oz/week    14 Standard drinks or equivalent, 28 Shots of liquor per week     Comment: drinks 2 martini's a night (2 shots in each)  . Drug use: No  . Sexual activity: Not on file   Other Topics Concern  . Not on file   Social History Narrative   Tries to remain active.  Frequent golfer but claudication limits this.     Review of Systems: General: negative for chills, fever, night sweats or weight changes.  Cardiovascular: negative for chest pain, dyspnea on exertion, edema, orthopnea, palpitations, paroxysmal nocturnal dyspnea or shortness of breath Dermatological: negative for rash Respiratory: negative for cough or wheezing Urologic: negative for hematuria Abdominal: negative for nausea, vomiting, diarrhea, bright red blood per rectum, melena, or hematemesis Neurologic: negative for visual changes, syncope, or dizziness All other systems reviewed and are otherwise negative except as noted above.    Blood pressure 118/62, pulse 65, height '5\' 8"'$  (1.727 m), weight 171 lb 6.4 oz (77.7 kg).  General appearance: alert and no distress Neck: no adenopathy, no carotid bruit, no JVD, supple, symmetrical, trachea midline  and thyroid not enlarged, symmetric, no tenderness/mass/nodules Lungs: clear to auscultation bilaterally Heart: regular rate and rhythm, S1, S2 normal, no murmur, click, rub or gallop Extremities: extremities normal, atraumatic, no cyanosis or edema  EKG normal sinus rhythm at 65 with a rightward axis and nonspecific ST and T-wave changes. I personally reviewed this EKG  ASSESSMENT AND PLAN:   PVD (peripheral vascular disease) with claudication [facility]) Mr. Caniglia returns today for postprocedure follow-up. He has had a remote aortobifemoral bypass graft and femoral endarterectomy by Dr. Donnetta Hutching. I intervened on his calcified mid right SFA 04/22/15 using directional atherectomy. He did well after that until recently when he had recurrent right lower extremity claudication and Dopplers showed a decrease in his right ABI to 0.34 with a new high-frequency signal in the origin of his right SFA as well as restenosis in his mid right SFA. I brought him back on 10/01/16 and performed drug-eluting angioplasty and stenting. An antegrade approach of his proximal right SFA as well as drug-eluting balloon angioplasty of his mid right SFA with excellent intragraft and clinical result. His Dopplers  normalized and his claudication resolved. He was on the following day on Coumadin. He was playing golf every week. I will repeat check Dopplers every 6 months and we'll see him back in one year for follow-up.      Lorretta Harp MD FACP,FACC,FAHA, [facility] 10/21/2016 3:49 PM

## 2016-10-21 NOTE — Patient Instructions (Signed)
Medication Instructions: Your physician recommends that you continue on your current medications as directed. Please refer to the Current Medication list given to you today.   Testing/Procedures: Your physician has requested that you have a lower extremity arterial duplex in 6 months. During this test, ultrasound is used to evaluate arterial blood flow in the legs. Allow one hour for this exam. There are no restrictions or special instructions.  Your physician has requested that you have an ankle brachial index (ABI) in 6 months. During this test an ultrasound and blood pressure cuff are used to evaluate the arteries that supply the arms and legs with blood. Allow thirty minutes for this exam. There are no restrictions or special instructions.   Follow-Up: Your physician wants you to follow-up in: 1 year with Dr. Gwenlyn Found. You will receive a reminder letter in the mail two months in advance. If you don't receive a letter, please call our office to schedule the follow-up appointment.  If you need a refill on your cardiac medications before your next appointment, please call your pharmacy.

## 2016-11-02 ENCOUNTER — Ambulatory Visit (INDEPENDENT_AMBULATORY_CARE_PROVIDER_SITE_OTHER): Payer: Medicare Other | Admitting: *Deleted

## 2016-11-02 ENCOUNTER — Other Ambulatory Visit: Payer: Self-pay | Admitting: Cardiovascular Disease

## 2016-11-02 DIAGNOSIS — I701 Atherosclerosis of renal artery: Secondary | ICD-10-CM

## 2016-11-02 DIAGNOSIS — G459 Transient cerebral ischemic attack, unspecified: Secondary | ICD-10-CM

## 2016-11-02 DIAGNOSIS — I359 Nonrheumatic aortic valve disorder, unspecified: Secondary | ICD-10-CM

## 2016-11-02 DIAGNOSIS — Z5181 Encounter for therapeutic drug level monitoring: Secondary | ICD-10-CM | POA: Diagnosis not present

## 2016-11-02 LAB — POCT INR: INR: 4

## 2016-11-15 ENCOUNTER — Other Ambulatory Visit: Payer: Self-pay | Admitting: Cardiovascular Disease

## 2016-11-23 ENCOUNTER — Ambulatory Visit (INDEPENDENT_AMBULATORY_CARE_PROVIDER_SITE_OTHER): Payer: Medicare Other | Admitting: *Deleted

## 2016-11-23 DIAGNOSIS — I701 Atherosclerosis of renal artery: Secondary | ICD-10-CM

## 2016-11-23 DIAGNOSIS — G459 Transient cerebral ischemic attack, unspecified: Secondary | ICD-10-CM

## 2016-11-23 DIAGNOSIS — Z5181 Encounter for therapeutic drug level monitoring: Secondary | ICD-10-CM | POA: Diagnosis not present

## 2016-11-23 DIAGNOSIS — I359 Nonrheumatic aortic valve disorder, unspecified: Secondary | ICD-10-CM | POA: Diagnosis not present

## 2016-11-23 LAB — POCT INR: INR: 3.4

## 2016-12-22 ENCOUNTER — Encounter (HOSPITAL_COMMUNITY): Payer: Self-pay

## 2016-12-22 ENCOUNTER — Emergency Department (HOSPITAL_COMMUNITY)
Admission: EM | Admit: 2016-12-22 | Discharge: 2016-12-22 | Disposition: A | Discharge status: Home / Self Care | Payer: Medicare Other | Attending: Emergency Medicine | Admitting: Emergency Medicine

## 2016-12-22 DIAGNOSIS — Z23 Encounter for immunization: Secondary | ICD-10-CM | POA: Diagnosis not present

## 2016-12-22 DIAGNOSIS — S61214A Laceration without foreign body of right ring finger without damage to nail, initial encounter: Secondary | ICD-10-CM | POA: Diagnosis not present

## 2016-12-22 DIAGNOSIS — W269XXA Contact with unspecified sharp object(s), initial encounter: Secondary | ICD-10-CM | POA: Diagnosis not present

## 2016-12-22 DIAGNOSIS — S61212A Laceration without foreign body of right middle finger without damage to nail, initial encounter: Secondary | ICD-10-CM | POA: Diagnosis not present

## 2016-12-22 DIAGNOSIS — Y999 Unspecified external cause status: Secondary | ICD-10-CM | POA: Diagnosis not present

## 2016-12-22 DIAGNOSIS — Z7901 Long term (current) use of anticoagulants: Secondary | ICD-10-CM | POA: Diagnosis not present

## 2016-12-22 DIAGNOSIS — I2510 Atherosclerotic heart disease of native coronary artery without angina pectoris: Secondary | ICD-10-CM | POA: Diagnosis not present

## 2016-12-22 DIAGNOSIS — Y929 Unspecified place or not applicable: Secondary | ICD-10-CM | POA: Diagnosis not present

## 2016-12-22 DIAGNOSIS — Z955 Presence of coronary angioplasty implant and graft: Secondary | ICD-10-CM | POA: Insufficient documentation

## 2016-12-22 DIAGNOSIS — Z7984 Long term (current) use of oral hypoglycemic drugs: Secondary | ICD-10-CM | POA: Diagnosis not present

## 2016-12-22 DIAGNOSIS — Z8673 Personal history of transient ischemic attack (TIA), and cerebral infarction without residual deficits: Secondary | ICD-10-CM | POA: Insufficient documentation

## 2016-12-22 DIAGNOSIS — S61216A Laceration without foreign body of right little finger without damage to nail, initial encounter: Secondary | ICD-10-CM | POA: Diagnosis not present

## 2016-12-22 DIAGNOSIS — I10 Essential (primary) hypertension: Secondary | ICD-10-CM | POA: Insufficient documentation

## 2016-12-22 DIAGNOSIS — Z87891 Personal history of nicotine dependence: Secondary | ICD-10-CM | POA: Diagnosis not present

## 2016-12-22 DIAGNOSIS — Y939 Activity, unspecified: Secondary | ICD-10-CM | POA: Diagnosis not present

## 2016-12-22 DIAGNOSIS — E119 Type 2 diabetes mellitus without complications: Secondary | ICD-10-CM | POA: Insufficient documentation

## 2016-12-22 MED ORDER — TETANUS-DIPHTH-ACELL PERTUSSIS 5-2.5-18.5 LF-MCG/0.5 IM SUSP
0.5000 mL | Freq: Once | INTRAMUSCULAR | Status: AC
  Administered 2016-12-22: 0.5 mL via INTRAMUSCULAR
  Filled 2016-12-22: qty 0.5

## 2016-12-22 MED ORDER — LIDOCAINE HCL (PF) 1 % IJ SOLN
5.0000 mL | Freq: Once | INTRAMUSCULAR | Status: DC
  Filled 2016-12-22: qty 5

## 2016-12-22 NOTE — ED Triage Notes (Signed)
Per Pt, Pt is coming from home with complaints of right hand injury noted while washing dishes. Pt reports dropping dish that broke and having it cut the right hand. Pt is currently on Coumadin. Pt reports large laceration to the middle finger and small ones noted to the ring and pinky.

## 2016-12-22 NOTE — ED Notes (Signed)
Pt soaking finger in betadine, peroxide & sterile water mixture for 10 mins

## 2016-12-22 NOTE — Discharge Instructions (Signed)
Keep wounds clean with soap and warm water.  Recommend to keep dressing in place for the next 24-48 hours. Follow-up with your primary care doctor for suture removal in 7-10 days. Return here for new concerns or signs of infection--- redness, severe swelling, purulent drainage, high fever, or other signs of infection.

## 2016-12-22 NOTE — ED Provider Notes (Signed)
[facility] DEPT Provider Note   CSN: [id number] Arrival date & time: 12/22/16  1814     History   Chief Complaint Chief Complaint  Patient presents with  . Finger Injury    HPI Dat [name] [name] is a 71 y.o. male.  The history is provided by the patient and medical records.   71 year old male with history of anemia, aortic stenosis status post mechanical valve replacement on Coumadin, coronary artery disease, hyperlipidemia, hypertension, diabetes, peripheral vascular disease, history of ITP, presenting to the ED for right hand laceration. Patient reports he was washing a ceramic ball when it slipped out of his hand and hit the divider of the kitchen sink. States bowl shattered and he tried to grab the pieces with his hands. He sustained lacerations to his right middle, ring, and small finger. He reports wound of middle finger is the worst. He denies any numbness or weakness of his hands. She does take Coumadin but has not had any noted excessive bleeding recently. Reports his INRs are usually therapeutic.  Patient is right hand dominant.  Past Medical History:  Diagnosis Date  . Adenomatous colon polyp 09/1997  . Anemia   . Aortic stenosis    s/p st. jude mechanical AVR - Chronic Coumadin  . Blood transfusion    "related to ITP"  . Bronchitis 12/17/2011   "just gettin over a case"  . Claudication ([place])   . Coronary artery disease    s/p cabg x 3 11/2003: lima-lad, seq vg to rpda and rpl  . Diverticulitis of colon   . Heart murmur   . Hyperlipidemia   . Hypertension   . ITP (idiopathic thrombocytopenic purpura)   . Peripheral arterial disease ([place])    a. history of aortobifemoral bypass grafting by Dr. Sherren Mocha early b. LE angiography 04/22/2015 patent aortobifem graft, DES to R SFA  . Peripheral vascular disease ([place])    s/p Left external Iliac Artery stenting and subsequent left femoral endarterectomy 02/2011- post op course complicated by wound infxn req I&D 03/2011  .  Renal artery stenosis, native, bilateral (HCC)    a. bilateral renal artery stenosis by recent duplex ultrasound b. L renal artery stent 02/2015, R renal artery patent on angiogram  . TIA (transient ischemic attack) ~ 2013  . Type II diabetes mellitus [facility])     Patient Active Problem List   Diagnosis Date Noted  . Claudication ([place]) 10/01/2016  . Renal artery arteriosclerosis ([place]) 02/22/2015  . Renal artery stenosis ([place]) 02/21/2015  . Renal artery stenosis, native, bilateral ([place]) 02/08/2015  . S/P AVR 07/20/2014  . Encounter for therapeutic drug monitoring 03/13/2014  . CVA (cerebral infarction) 05/05/2013  . Aortoiliac occlusive disease ([place]) 01/10/2013  . PVD (peripheral vascular disease) with claudication ([place]) 02/02/2012  . Atherosclerosis of native arteries of extremity with intermittent claudication ([place]) 01/12/2012  . Coronary artery disease   . Aortic stenosis   . Peripheral vascular disease ([place])   . Hypertension   . Hyperlipidemia   . ITP (idiopathic thrombocytopenic purpura)   . Coronary atherosclerosis 10/21/2010  . Transient cerebral ischemia 10/10/2009  . Diabetes ([place]) 10/22/2008  . HYPERCHOLESTEROLEMIA 10/22/2008  . Immune thrombocytopenic purpura ([place]) 10/22/2008  . Essential hypertension 10/22/2008  . AORTIC STENOSIS 10/22/2008  . PVD (peripheral vascular disease) ([place]) 10/22/2008  . DIVERTICULITIS, COLON 10/22/2008  . COLONIC POLYPS, HX OF 10/22/2008    Past Surgical History:  Procedure Laterality Date  . ABDOMINAL AORTAGRAM N/A 12/16/2011   Procedure: ABDOMINAL AORTAGRAM;  Surgeon:  Sherren Mocha, MD;  Location: [facility] CATH LAB;  Service: Cardiovascular;  Laterality: N/A;  . ANGIOPLASTY / STENTING ILIAC     Left external Iliac Artery  . AORTA - BILATERAL FEMORAL ARTERY BYPASS GRAFT  01/18/2012   Procedure: AORTA BIFEMORAL BYPASS GRAFT;  Surgeon: Curt Jews, MD;  Location: [facility];  Service: Vascular;  Laterality: N/A;  . AORTIC VALVE REPLACEMENT  ~ 2004  .  CARDIAC CATHETERIZATION  11/2003   /pt report 10/01/2016  . CARDIAC VALVE REPLACEMENT     aortic  . CATARACT EXTRACTION W/ INTRAOCULAR LENS  IMPLANT, BILATERAL Bilateral   . CORONARY ARTERY BYPASS GRAFT  11/2003   Archie Endo 04/21/2011  . LOWER EXTREMITY ANGIOGRAM N/A 02/21/2015   Procedure: LOWER EXTREMITY ANGIOGRAM;  Surgeon: Lorretta Harp, MD;  Location: [facility] CATH LAB;  Service: Cardiovascular;  Laterality: N/A;  . PERIPHERAL VASCULAR CATHETERIZATION N/A 04/22/2015   Procedure: Lower Extremity Angiography;  Surgeon: Lorretta Harp, MD;  Location: [facility] CV LAB;  Service: Cardiovascular;  Laterality: N/A;  . PERIPHERAL VASCULAR CATHETERIZATION N/A 08/24/2016   Procedure: Lower Extremity Angiography;  Surgeon: Lorretta Harp, MD;  Location: [facility] CV LAB;  Service: Cardiovascular;  Laterality: N/A;  . PERIPHERAL VASCULAR CATHETERIZATION Right 10/01/2016   Procedure: Peripheral Vascular Intervention - STENT;  Surgeon: Lorretta Harp, MD;  Location: [facility] CV LAB;  Service: Cardiovascular;  Laterality: Right;  Prox and MID SFA   . RENAL ANGIOGRAM N/A 02/21/2015   Procedure: RENAL ANGIOGRAM;  Surgeon: Lorretta Harp, MD;  Location: [facility] CATH LAB;  Service: Cardiovascular;  Laterality: Bilateral; 6 mm x 12 mm long Herculink balloon expandable stent to the left renal artery  . RENAL ARTERY STENT Left 04/22/2015   dr berry  . SPLENECTOMY  02/2003   Archie Endo 04/21/2011  . TONSILLECTOMY  ~ 1952       Home Medications    Prior to Admission medications   Medication Sig Start Date End Date Taking? Authorizing Provider  amLODipine (NORVASC) 10 MG tablet Take 10 mg by mouth daily. 02/11/15   Historical Provider, MD  amoxicillin (AMOXIL) 500 MG capsule Take 2,000 mg by mouth See admin instructions. Will take 30-60 minutes prior to dental appointments 07/29/16   Historical Provider, MD  atorvastatin (LIPITOR) 40 MG tablet Take 40 mg by mouth daily.     Historical Provider, MD    canagliflozin (INVOKANA) 100 MG TABS tablet Take 100 mg by mouth daily.    Historical Provider, MD  clopidogrel (PLAVIX) 75 MG tablet Take 1 tablet (75 mg total) by mouth daily with breakfast. 10/03/16   Arbutus Leas, NP  enoxaparin (LOVENOX) 80 MG/0.8ML injection Inject 0.8 mLs (80 mg total) into the skin every 12 (twelve) hours. Take as instructed by Coumadin Clinic 09/11/16   Josue Hector, MD  glyBURIDE (DIABETA) 2.5 MG tablet Take 5 mg by mouth daily with breakfast.     Historical Provider, MD  hydrochlorothiazide (MICROZIDE) 12.5 MG capsule TAKE ONE CAPSULE BY MOUTH EVERY DAY 09/24/16   Lorretta Harp, MD  Hyoscyamine Sulfate SL (LEVSIN/SL) 0.125 MG SUBL Take 1-2 capsules by mouth before meals 08/20/16   Ladene Artist, MD  Melatonin 5 MG TABS Take 1 tablet by mouth at bedtime.    Historical Provider, MD  metoprolol succinate (TOPROL-XL) 50 MG 24 hr tablet Take 1 tablet (50 mg total) by mouth daily. Take with or immediately following a meal. 06/25/16   Josue Hector, MD  pantoprazole (PROTONIX) 40  MG tablet Take 1 tablet (40 mg total) by mouth daily. 08/20/16   Ladene Artist, MD  sildenafil (REVATIO) 20 MG tablet Take 20 mg by mouth as directed.    Historical Provider, MD  trandolapril (MAVIK) 4 MG tablet Take 1 tablet (4 mg total) by mouth daily. 10/02/16   Arbutus Leas, NP  warfarin (COUMADIN) 7.5 MG tablet TAKE AS DIRECTED BY COUMADIN CLINIC 11/02/16   Josue Hector, MD  warfarin (COUMADIN) 7.5 MG tablet TAKE AS DIRECTED BY COUMADIN CLINIC 11/16/16   Josue Hector, MD    Family History Family History  Problem Relation Age of Onset  . Coronary artery disease Mother     bypass surgery - deceased  . Heart disease Father     murmur, valve replacement - deceased  . Breast cancer Sister   . Diabetes      grandmother  . Diabetes Paternal Grandmother   . Diabetes Paternal Aunt   . Colon cancer Neg Hx     Social History Social History  Substance Use Topics  . Smoking status:  Former Smoker    Years: 30.00    Types: Cigarettes, Cigars    Quit date: 12/15/1993  . Smokeless tobacco: Never Used  . Alcohol use 25.2 oz/week    28 Shots of liquor, 14 Standard drinks or equivalent per week     Comment: drinks 2 martini's a night (2 shots in each)     Allergies   Patient has no known allergies.   Review of Systems Review of Systems  Skin: Positive for wound.  All other systems reviewed and are negative.    Physical Exam Updated Vital Signs BP 136/99 (BP Location: Left Arm)   Pulse 73   Temp 98.4 F (36.9 C) (Oral)   Resp 18   Ht '5\' 8"'$  (1.727 m)   Wt 78.5 kg   SpO2 94%   BMI 26.30 kg/m   Physical Exam  Constitutional: He is oriented to person, place, and time. He appears well-developed and well-nourished.  HENT:  Head: Normocephalic and atraumatic.  Mouth/Throat: Oropharynx is clear and moist.  Eyes: Conjunctivae and EOM are normal. Pupils are equal, round, and reactive to light.  Neck: Normal range of motion.  Cardiovascular: Normal rate, regular rhythm and normal heart sounds.   Pulmonary/Chest: Effort normal and breath sounds normal.  Abdominal: Soft. Bowel sounds are normal.  Musculoskeletal: Normal range of motion.  3cm horizontal laceration to right middle finger along the proximal phalanx just proximal to PIP joint ; there is small amount of bleeding noted; full flexion/extension of finger maintained without noted tendon involvement 1cm diagonal superficial abrasion type injuries to the right ring and pinky fingers; there is no active bleeding of these wounds Normal sensation of all 4 fingers, hand is warm and well perfused  Neurological: He is alert and oriented to person, place, and time.  Skin: Skin is warm and dry.  Psychiatric: He has a normal mood and affect.  Nursing note and vitals reviewed.    ED Treatments / Results  Labs (all labs ordered are listed, but only abnormal results are displayed) Labs Reviewed - No data to  display  EKG  EKG Interpretation None       Radiology No results found.  Procedures Procedures (including critical care time)  LACERATION REPAIR Performed by: Larene Pickett Authorized by: Larene Pickett Consent: Verbal consent obtained. Risks and benefits: risks, benefits and alternatives were discussed Consent given by: patient Patient  identity confirmed: provided demographic data Prepped and Draped in normal sterile fashion Wound explored  Laceration Location: right middle finger  Laceration Length: 3cm  No Foreign Bodies seen or palpated  Anesthesia: local infiltration  Local anesthetic: lidocaine 1% without epinephrine  Anesthetic total: 4 ml  Irrigation method: syringe Amount of cleaning: standard  Skin closure: 5-0 prolene  Number of sutures: 4  Technique: simple interrupted  Patient tolerance: Patient tolerated the procedure well with no immediate complications.  LACERATION REPAIR Performed by: Larene Pickett Authorized by: Larene Pickett Consent: Verbal consent obtained. Risks and benefits: risks, benefits and alternatives were discussed Consent given by: patient Patient identity confirmed: provided demographic data Prepped and Draped in normal sterile fashion Wound explored  Laceration Location: right 4th digit  Laceration Length: 1cm  No Foreign Bodies seen or palpated  Anesthesia: none  Local anesthetic: none  Anesthetic total: 0 ml  Irrigation method: syringe Amount of cleaning: standard  Skin closure: dermabond  Number of sutures: 0  Technique: n/a  Patient tolerance: Patient tolerated the procedure well with no immediate complications.  LACERATION REPAIR Performed by: Larene Pickett Authorized by: Larene Pickett Consent: Verbal consent obtained. Risks and benefits: risks, benefits and alternatives were discussed Consent given by: patient Patient identity confirmed: provided demographic data Prepped and Draped  in normal sterile fashion Wound explored  Laceration Location: right 5th digit  Laceration Length: 1cm  No Foreign Bodies seen or palpated  Anesthesia: nont  Local anesthetic: none  Anesthetic total: 0 ml  Irrigation method: syringe Amount of cleaning: standard  Skin closure: dermabond  Number of sutures: 0  Technique: n/a  Patient tolerance: Patient tolerated the procedure well with no immediate complications.   Medications Ordered in ED Medications - No data to display   Initial Impression / Assessment and Plan / ED Course  I have reviewed the triage vital signs and the nursing notes.  Pertinent labs & imaging results that were available during my care of the patient were reviewed by me and considered in my medical decision making (see chart for details).  Clinical Course    71 year old male here with right hand lacerations from a broken ceramic bowl. He is on Coumadin but only has minimal bleeding on arrival. He has 3 cm horizontal laceration to right middle finger just proximal to the PIP joint. There is no tendon involvement. He has full range of motion of his finger. He also has 1 cm diagonal, superficial lacerations to the right fourth and fifth digits without active bleeding. His hand is neurovascularly intact. Tetanus was updated. Lacerations repaired here, patient tolerated well. Patient will be discharged home in stable condition. He was instructed on home wound care. He will follow-up with his primary care doctor for suture removal in 7-10 days.  Discussed plan with patient and wife at bedside, they both acknowledged understanding and agreed with plan of care.  Return precautions given for new or worsening symptoms.  Final Clinical Impressions(s) / ED Diagnoses   Final diagnoses:  Laceration of right middle finger without foreign body without damage to nail, initial encounter  Laceration of right ring finger without foreign body without damage to nail, initial  encounter  Laceration of right little finger without damage to nail, initial encounter    New Prescriptions Discharge Medication List as of 12/22/2016 10:20 PM       Larene Pickett, PA-C 12/22/16 2315    Lacretia Leigh, MD 12/23/16 1301

## 2016-12-28 ENCOUNTER — Ambulatory Visit (INDEPENDENT_AMBULATORY_CARE_PROVIDER_SITE_OTHER): Payer: Medicare Other

## 2016-12-28 DIAGNOSIS — I359 Nonrheumatic aortic valve disorder, unspecified: Secondary | ICD-10-CM | POA: Diagnosis not present

## 2016-12-28 DIAGNOSIS — Z5181 Encounter for therapeutic drug level monitoring: Secondary | ICD-10-CM | POA: Diagnosis not present

## 2016-12-28 DIAGNOSIS — G459 Transient cerebral ischemic attack, unspecified: Secondary | ICD-10-CM

## 2016-12-28 LAB — POCT INR: INR: 2.6

## 2016-12-31 DIAGNOSIS — Z6827 Body mass index (BMI) 27.0-27.9, adult: Secondary | ICD-10-CM | POA: Diagnosis not present

## 2016-12-31 DIAGNOSIS — Z4802 Encounter for removal of sutures: Secondary | ICD-10-CM | POA: Diagnosis not present

## 2016-12-31 DIAGNOSIS — S61411A Laceration without foreign body of right hand, initial encounter: Secondary | ICD-10-CM | POA: Diagnosis not present

## 2017-01-06 ENCOUNTER — Encounter (HOSPITAL_COMMUNITY): Payer: Self-pay | Admitting: *Deleted

## 2017-01-24 ENCOUNTER — Other Ambulatory Visit: Payer: Self-pay | Admitting: Cardiovascular Disease

## 2017-02-01 ENCOUNTER — Ambulatory Visit (INDEPENDENT_AMBULATORY_CARE_PROVIDER_SITE_OTHER): Payer: Medicare Other | Admitting: *Deleted

## 2017-02-01 DIAGNOSIS — Z5181 Encounter for therapeutic drug level monitoring: Secondary | ICD-10-CM

## 2017-02-01 DIAGNOSIS — I359 Nonrheumatic aortic valve disorder, unspecified: Secondary | ICD-10-CM

## 2017-02-01 DIAGNOSIS — Z0000 Encounter for general adult medical examination without abnormal findings: Secondary | ICD-10-CM | POA: Diagnosis not present

## 2017-02-01 DIAGNOSIS — E1151 Type 2 diabetes mellitus with diabetic peripheral angiopathy without gangrene: Secondary | ICD-10-CM | POA: Diagnosis not present

## 2017-02-01 DIAGNOSIS — G459 Transient cerebral ischemic attack, unspecified: Secondary | ICD-10-CM | POA: Diagnosis not present

## 2017-02-01 DIAGNOSIS — E784 Other hyperlipidemia: Secondary | ICD-10-CM | POA: Diagnosis not present

## 2017-02-01 DIAGNOSIS — I10 Essential (primary) hypertension: Secondary | ICD-10-CM | POA: Diagnosis not present

## 2017-02-01 DIAGNOSIS — Z125 Encounter for screening for malignant neoplasm of prostate: Secondary | ICD-10-CM | POA: Diagnosis not present

## 2017-02-01 LAB — POCT INR: INR: 3.7

## 2017-02-05 DIAGNOSIS — N182 Chronic kidney disease, stage 2 (mild): Secondary | ICD-10-CM | POA: Diagnosis not present

## 2017-02-05 DIAGNOSIS — E784 Other hyperlipidemia: Secondary | ICD-10-CM | POA: Diagnosis not present

## 2017-02-05 DIAGNOSIS — Z1389 Encounter for screening for other disorder: Secondary | ICD-10-CM | POA: Diagnosis not present

## 2017-02-05 DIAGNOSIS — R808 Other proteinuria: Secondary | ICD-10-CM | POA: Diagnosis not present

## 2017-02-05 DIAGNOSIS — I1310 Hypertensive heart and chronic kidney disease without heart failure, with stage 1 through stage 4 chronic kidney disease, or unspecified chronic kidney disease: Secondary | ICD-10-CM | POA: Diagnosis not present

## 2017-02-05 DIAGNOSIS — E1151 Type 2 diabetes mellitus with diabetic peripheral angiopathy without gangrene: Secondary | ICD-10-CM | POA: Diagnosis not present

## 2017-02-05 DIAGNOSIS — Z6827 Body mass index (BMI) 27.0-27.9, adult: Secondary | ICD-10-CM | POA: Diagnosis not present

## 2017-02-05 DIAGNOSIS — Z0000 Encounter for general adult medical examination without abnormal findings: Secondary | ICD-10-CM | POA: Diagnosis not present

## 2017-02-05 DIAGNOSIS — E1122 Type 2 diabetes mellitus with diabetic chronic kidney disease: Secondary | ICD-10-CM | POA: Diagnosis not present

## 2017-02-05 DIAGNOSIS — J449 Chronic obstructive pulmonary disease, unspecified: Secondary | ICD-10-CM | POA: Diagnosis not present

## 2017-02-05 DIAGNOSIS — I701 Atherosclerosis of renal artery: Secondary | ICD-10-CM | POA: Diagnosis not present

## 2017-02-09 ENCOUNTER — Other Ambulatory Visit: Payer: Self-pay | Admitting: Cardiovascular Disease

## 2017-02-09 DIAGNOSIS — I6523 Occlusion and stenosis of bilateral carotid arteries: Secondary | ICD-10-CM

## 2017-02-15 ENCOUNTER — Other Ambulatory Visit: Payer: Self-pay | Admitting: *Deleted

## 2017-02-15 MED ORDER — CLOPIDOGREL BISULFATE 75 MG PO TABS: 75.0000 mg | ORAL_TABLET | Freq: Every day | ORAL | 6 refills | Status: DC

## 2017-02-16 ENCOUNTER — Other Ambulatory Visit: Payer: Self-pay | Admitting: *Deleted

## 2017-02-16 MED ORDER — METOPROLOL SUCCINATE ER 50 MG PO TB24: 50.0000 mg | ORAL_TABLET | Freq: Every day | ORAL | 11 refills | Status: DC

## 2017-02-16 NOTE — Telephone Encounter (Signed)
REFILL 

## 2017-02-17 DIAGNOSIS — L570 Actinic keratosis: Secondary | ICD-10-CM | POA: Diagnosis not present

## 2017-02-18 DIAGNOSIS — Z1212 Encounter for screening for malignant neoplasm of rectum: Secondary | ICD-10-CM | POA: Diagnosis not present

## 2017-02-24 ENCOUNTER — Ambulatory Visit (HOSPITAL_COMMUNITY)
Admission: RE | Admit: 2017-02-24 | Discharge: 2017-02-24 | Disposition: A | Discharge status: Home / Self Care | Payer: Medicare Other | Source: Ambulatory Visit | Attending: Cardiovascular Disease | Admitting: Cardiovascular Disease

## 2017-02-24 DIAGNOSIS — I6523 Occlusion and stenosis of bilateral carotid arteries: Secondary | ICD-10-CM | POA: Diagnosis not present

## 2017-03-01 ENCOUNTER — Telehealth: Payer: Self-pay | Admitting: Cardiovascular Disease

## 2017-03-01 ENCOUNTER — Ambulatory Visit (INDEPENDENT_AMBULATORY_CARE_PROVIDER_SITE_OTHER): Payer: Medicare Other | Admitting: *Deleted

## 2017-03-01 DIAGNOSIS — Z5181 Encounter for therapeutic drug level monitoring: Secondary | ICD-10-CM | POA: Diagnosis not present

## 2017-03-01 DIAGNOSIS — I6523 Occlusion and stenosis of bilateral carotid arteries: Secondary | ICD-10-CM | POA: Diagnosis not present

## 2017-03-01 LAB — POCT INR: INR: 4.2

## 2017-03-01 NOTE — Telephone Encounter (Signed)
Mr.Clopper is returning a call from Last week .  Please call .[name] Thanks

## 2017-03-01 NOTE — Telephone Encounter (Signed)
Pt is aware of results and MD's recommendations.

## 2017-03-15 ENCOUNTER — Ambulatory Visit (INDEPENDENT_AMBULATORY_CARE_PROVIDER_SITE_OTHER): Payer: Medicare Other | Admitting: Pharmacist

## 2017-03-15 DIAGNOSIS — Z5181 Encounter for therapeutic drug level monitoring: Secondary | ICD-10-CM | POA: Diagnosis not present

## 2017-03-15 DIAGNOSIS — I6523 Occlusion and stenosis of bilateral carotid arteries: Secondary | ICD-10-CM

## 2017-03-15 LAB — POCT INR: INR: 3.2

## 2017-03-19 ENCOUNTER — Other Ambulatory Visit: Payer: Self-pay | Admitting: Cardiovascular Disease

## 2017-04-23 ENCOUNTER — Telehealth: Payer: Self-pay | Admitting: *Deleted

## 2017-04-23 NOTE — Telephone Encounter (Signed)
Called for [facility] registry follow-up. Left message.

## 2017-05-05 DIAGNOSIS — E119 Type 2 diabetes mellitus without complications: Secondary | ICD-10-CM | POA: Diagnosis not present

## 2017-05-05 DIAGNOSIS — H43813 Vitreous degeneration, bilateral: Secondary | ICD-10-CM | POA: Diagnosis not present

## 2017-05-05 DIAGNOSIS — H52203 Unspecified astigmatism, bilateral: Secondary | ICD-10-CM | POA: Diagnosis not present

## 2017-05-05 DIAGNOSIS — H26493 Other secondary cataract, bilateral: Secondary | ICD-10-CM | POA: Diagnosis not present

## 2017-05-20 DIAGNOSIS — H26491 Other secondary cataract, right eye: Secondary | ICD-10-CM | POA: Diagnosis not present

## 2017-06-03 DIAGNOSIS — H26492 Other secondary cataract, left eye: Secondary | ICD-10-CM | POA: Diagnosis not present

## 2017-06-11 ENCOUNTER — Other Ambulatory Visit: Payer: Self-pay | Admitting: Cardiovascular Disease

## 2017-06-11 DIAGNOSIS — I739 Peripheral vascular disease, unspecified: Secondary | ICD-10-CM

## 2017-06-14 DIAGNOSIS — I1310 Hypertensive heart and chronic kidney disease without heart failure, with stage 1 through stage 4 chronic kidney disease, or unspecified chronic kidney disease: Secondary | ICD-10-CM | POA: Diagnosis not present

## 2017-06-14 DIAGNOSIS — Z6826 Body mass index (BMI) 26.0-26.9, adult: Secondary | ICD-10-CM | POA: Diagnosis not present

## 2017-06-14 DIAGNOSIS — Z952 Presence of prosthetic heart valve: Secondary | ICD-10-CM | POA: Diagnosis not present

## 2017-06-14 DIAGNOSIS — E1122 Type 2 diabetes mellitus with diabetic chronic kidney disease: Secondary | ICD-10-CM | POA: Diagnosis not present

## 2017-06-14 DIAGNOSIS — I7389 Other specified peripheral vascular diseases: Secondary | ICD-10-CM | POA: Diagnosis not present

## 2017-06-14 DIAGNOSIS — E663 Overweight: Secondary | ICD-10-CM | POA: Diagnosis not present

## 2017-06-22 ENCOUNTER — Ambulatory Visit (INDEPENDENT_AMBULATORY_CARE_PROVIDER_SITE_OTHER): Payer: Medicare Other

## 2017-06-22 DIAGNOSIS — Z5181 Encounter for therapeutic drug level monitoring: Secondary | ICD-10-CM

## 2017-06-22 DIAGNOSIS — I6523 Occlusion and stenosis of bilateral carotid arteries: Secondary | ICD-10-CM

## 2017-06-22 LAB — POCT INR: INR: 3.6

## 2017-06-23 ENCOUNTER — Ambulatory Visit (HOSPITAL_COMMUNITY)
Admission: RE | Admit: 2017-06-23 | Discharge: 2017-06-23 | Disposition: A | Discharge status: Home / Self Care | Payer: Medicare Other | Source: Ambulatory Visit | Attending: Cardiology | Admitting: Cardiology

## 2017-06-23 DIAGNOSIS — E785 Hyperlipidemia, unspecified: Secondary | ICD-10-CM | POA: Insufficient documentation

## 2017-06-23 DIAGNOSIS — I739 Peripheral vascular disease, unspecified: Secondary | ICD-10-CM | POA: Diagnosis not present

## 2017-06-23 DIAGNOSIS — E1151 Type 2 diabetes mellitus with diabetic peripheral angiopathy without gangrene: Secondary | ICD-10-CM | POA: Diagnosis not present

## 2017-06-23 DIAGNOSIS — I779 Disorder of arteries and arterioles, unspecified: Secondary | ICD-10-CM | POA: Diagnosis not present

## 2017-06-23 DIAGNOSIS — Z8673 Personal history of transient ischemic attack (TIA), and cerebral infarction without residual deficits: Secondary | ICD-10-CM | POA: Insufficient documentation

## 2017-06-23 DIAGNOSIS — Z87891 Personal history of nicotine dependence: Secondary | ICD-10-CM | POA: Insufficient documentation

## 2017-06-23 DIAGNOSIS — I2510 Atherosclerotic heart disease of native coronary artery without angina pectoris: Secondary | ICD-10-CM | POA: Insufficient documentation

## 2017-06-23 DIAGNOSIS — R938 Abnormal findings on diagnostic imaging of other specified body structures: Secondary | ICD-10-CM | POA: Diagnosis not present

## 2017-06-23 DIAGNOSIS — I10 Essential (primary) hypertension: Secondary | ICD-10-CM | POA: Diagnosis not present

## 2017-06-29 ENCOUNTER — Other Ambulatory Visit: Payer: Self-pay | Admitting: Cardiovascular Disease

## 2017-06-29 DIAGNOSIS — I739 Peripheral vascular disease, unspecified: Secondary | ICD-10-CM

## 2017-07-21 ENCOUNTER — Ambulatory Visit (INDEPENDENT_AMBULATORY_CARE_PROVIDER_SITE_OTHER): Payer: Medicare Other | Admitting: *Deleted

## 2017-07-21 DIAGNOSIS — Z5181 Encounter for therapeutic drug level monitoring: Secondary | ICD-10-CM

## 2017-07-21 DIAGNOSIS — I6523 Occlusion and stenosis of bilateral carotid arteries: Secondary | ICD-10-CM

## 2017-07-21 LAB — POCT INR: INR: 2.9

## 2017-08-16 ENCOUNTER — Other Ambulatory Visit: Payer: Self-pay

## 2017-08-16 MED ORDER — TRANDOLAPRIL 4 MG PO TABS: 4.0000 mg | ORAL_TABLET | Freq: Every day | ORAL | 6 refills | Status: DC

## 2017-08-29 ENCOUNTER — Other Ambulatory Visit: Payer: Self-pay | Admitting: Cardiovascular Disease

## 2017-10-04 ENCOUNTER — Ambulatory Visit (INDEPENDENT_AMBULATORY_CARE_PROVIDER_SITE_OTHER): Payer: Medicare Other | Admitting: *Deleted

## 2017-10-04 DIAGNOSIS — I350 Nonrheumatic aortic (valve) stenosis: Secondary | ICD-10-CM

## 2017-10-04 DIAGNOSIS — Z5181 Encounter for therapeutic drug level monitoring: Secondary | ICD-10-CM

## 2017-10-04 DIAGNOSIS — G459 Transient cerebral ischemic attack, unspecified: Secondary | ICD-10-CM | POA: Diagnosis not present

## 2017-10-04 LAB — POCT INR: INR: 3.9

## 2017-10-11 DIAGNOSIS — Z7901 Long term (current) use of anticoagulants: Secondary | ICD-10-CM | POA: Diagnosis not present

## 2017-10-11 DIAGNOSIS — I7389 Other specified peripheral vascular diseases: Secondary | ICD-10-CM | POA: Diagnosis not present

## 2017-10-11 DIAGNOSIS — Z23 Encounter for immunization: Secondary | ICD-10-CM | POA: Diagnosis not present

## 2017-10-11 DIAGNOSIS — Z6827 Body mass index (BMI) 27.0-27.9, adult: Secondary | ICD-10-CM | POA: Diagnosis not present

## 2017-10-11 DIAGNOSIS — I1310 Hypertensive heart and chronic kidney disease without heart failure, with stage 1 through stage 4 chronic kidney disease, or unspecified chronic kidney disease: Secondary | ICD-10-CM | POA: Diagnosis not present

## 2017-10-11 DIAGNOSIS — E1122 Type 2 diabetes mellitus with diabetic chronic kidney disease: Secondary | ICD-10-CM | POA: Diagnosis not present

## 2017-10-22 DIAGNOSIS — D2372 Other benign neoplasm of skin of left lower limb, including hip: Secondary | ICD-10-CM | POA: Diagnosis not present

## 2017-10-22 DIAGNOSIS — L570 Actinic keratosis: Secondary | ICD-10-CM | POA: Diagnosis not present

## 2017-10-22 DIAGNOSIS — D225 Melanocytic nevi of trunk: Secondary | ICD-10-CM | POA: Diagnosis not present

## 2017-10-22 DIAGNOSIS — D1801 Hemangioma of skin and subcutaneous tissue: Secondary | ICD-10-CM | POA: Diagnosis not present

## 2017-10-22 DIAGNOSIS — L918 Other hypertrophic disorders of the skin: Secondary | ICD-10-CM | POA: Diagnosis not present

## 2017-10-25 ENCOUNTER — Ambulatory Visit (INDEPENDENT_AMBULATORY_CARE_PROVIDER_SITE_OTHER): Payer: Medicare Other | Admitting: *Deleted

## 2017-10-25 DIAGNOSIS — Z5181 Encounter for therapeutic drug level monitoring: Secondary | ICD-10-CM | POA: Diagnosis not present

## 2017-10-25 DIAGNOSIS — G459 Transient cerebral ischemic attack, unspecified: Secondary | ICD-10-CM

## 2017-10-25 LAB — POCT INR: INR: 2.2

## 2017-10-25 NOTE — Patient Instructions (Signed)
Today take 1.5 tablets then continue taking same dosage 1 tablet daily except 1.5  tablets on Sundays. Recheck INR 3 weeks.

## 2017-11-05 ENCOUNTER — Encounter: Payer: Self-pay | Admitting: Cardiovascular Disease

## 2017-11-05 ENCOUNTER — Ambulatory Visit (INDEPENDENT_AMBULATORY_CARE_PROVIDER_SITE_OTHER): Payer: Medicare Other | Admitting: Cardiovascular Disease

## 2017-11-05 VITALS — BP 114/76 | HR 78 | Ht 68.0 in | Wt 175.0 lb

## 2017-11-05 DIAGNOSIS — I10 Essential (primary) hypertension: Secondary | ICD-10-CM | POA: Diagnosis not present

## 2017-11-05 DIAGNOSIS — I350 Nonrheumatic aortic (valve) stenosis: Secondary | ICD-10-CM | POA: Diagnosis not present

## 2017-11-05 DIAGNOSIS — I739 Peripheral vascular disease, unspecified: Secondary | ICD-10-CM | POA: Diagnosis not present

## 2017-11-05 DIAGNOSIS — I6529 Occlusion and stenosis of unspecified carotid artery: Secondary | ICD-10-CM | POA: Diagnosis not present

## 2017-11-05 DIAGNOSIS — I701 Atherosclerosis of renal artery: Secondary | ICD-10-CM

## 2017-11-05 NOTE — Progress Notes (Signed)
11/05/2017 [name] [name]   [birth year]  [id number]  Primary Physician Shon Baton, MD Primary Cardiologist: Lorretta Harp MD [name], [place]  HPI:  [name] [name] is a 71 y.o.  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 I last saw him in the office  10/21/16. He retired a little over 2 years ago having worked there for 25 years. His primary care physician is Dr. Virgina Jock. He was referred by Dr. Johnsie Cancel for peripheral vascular evaluation because of resistant hypertension with duplex evidence of bilateral renal artery stenosis as well as new-onset right calf claudication with recent Dopplers that showed a lesion in his mid right SFA. He does have a history of remote tobacco abuse as well as treated hypertension, hyperlipidemia and diabetes. He has had coronary bypass grafting and [facility] aVR performed by Dr. Cyndia Bent December 2004 on Coumadin anticoagulation. He has also had aortobifemoral bypass grafting, left external iliac stenting and left common femoral endarterectomy performed by Dr. Donnetta Hutching. I performed left renal artery stenting 02/21/15 with an excellent angiographic and clinical result. His blood pressure has been under better control. I then perform staged right SFA directional atherectomy 04/22/15 removing a high-grade eccentric calcified mid to distal right SFA plaque. He did have progression of disease at the origin of his right SFA. His claudication markedly improved after his procedure however since I saw on the year ago he's had some recurrent claudication. His blood pressures have been under better control since his renal artery intervention. Recent lower extremity Doppler studies performed 07/15/16 revealed a right ABI 0.34 with a new high-frequency signal in the origin of his right SFA. I performed angiography 08/24/16 revealing a 95%  proximal right SFA stenosis, 80% mid right SFA stenosis and 80% calcified mid left SFA stenosis. In addition, he had 75% "in-stent restenosis" within the previously placed left renal artery stent. Intervention will be somewhat challenging given the proximity of the proximal right SFA stenosis . He underwent peripheral angiography intervention by myself 10/01/16. Right antegrade approach. I was able to stick the hood of his aortobifem and intrarenal in the proximal right SFA with drug-eluting [place] plasty and stents. I also performed drug-eluting plasty in the mid right SFA. His symptoms resolved after that and he is back playing golf. He was discharged on the following day. His Dopplers have normalized. Since I saw him in the office a year ago he's remained stable. He denies claudication. His most recent Dopplers performed in July of this year revealed widely patent proximal right SFA stent and mid right SFA.      Current Meds  Medication Sig  . amLODipine (NORVASC) 10 MG tablet Take 10 mg by mouth daily.  [name] amoxicillin (AMOXIL) 500 MG capsule Take 2,000 mg by mouth See admin instructions. Will take 30-60 minutes prior to dental appointments  . atorvastatin (LIPITOR) 40 MG tablet Take 40 mg by mouth daily.   . canagliflozin (INVOKANA) 100 MG TABS tablet Take 100 mg by mouth daily.  [name] enoxaparin (LOVENOX) 80 MG/0.8ML injection Inject 0.8 mLs (80 mg total) into the skin every 12 (twelve) hours. Take as instructed by Coumadin Clinic  . glyBURIDE (DIABETA) 2.5 MG tablet Take 5 mg by mouth daily with breakfast.   . hydrochlorothiazide (MICROZIDE) 12.5 MG capsule TAKE ONE CAPSULE BY MOUTH EVERY DAY  . Melatonin 5 MG TABS  Take 1 tablet by mouth at bedtime.  . metoprolol succinate (TOPROL-XL) 50 MG 24 hr tablet Take 1 tablet (50 mg total) by mouth daily. Take with or immediately following a meal.  . sildenafil (REVATIO) 20 MG tablet Take 20 mg by mouth as directed.  . trandolapril (MAVIK) 4 MG  tablet Take 1 tablet (4 mg total) by mouth daily.  [name] warfarin (COUMADIN) 7.5 MG tablet TAKE AS DIRECTED BY COUMADIN CLINIC     No Known Allergies  Social History   Socioeconomic History  . Marital status: Divorced    Spouse name: Not on file  . Number of children: 3  . Years of education: Not on file  . Highest education level: Not on file  Social Needs  . Financial resource strain: Not on file  . Food insecurity - worry: Not on file  . Food insecurity - inability: Not on file  . Transportation needs - medical: Not on file  . Transportation needs - non-medical: Not on file  Occupational History  . Occupation: Retired  Tobacco Use  . Smoking status: Former Smoker    Years: 30.00    Types: Cigarettes, Cigars    Last attempt to quit: 12/15/1993    Years since quitting: 23.9  . Smokeless tobacco: Never Used  Substance and Sexual Activity  . Alcohol use: Yes    Alcohol/week: 25.2 oz    Types: 28 Shots of liquor, 14 Standard drinks or equivalent per week    Comment: drinks 2 martini's a night (2 shots in each)  . Drug use: No  . Sexual activity: Not on file  Other Topics Concern  . Not on file  Social History Narrative   Tries to remain active.  Frequent golfer but claudication limits this.     Review of Systems: General: negative for chills, fever, night sweats or weight changes.  Cardiovascular: negative for chest pain, dyspnea on exertion, edema, orthopnea, palpitations, paroxysmal nocturnal dyspnea or shortness of breath Dermatological: negative for rash Respiratory: negative for cough or wheezing Urologic: negative for hematuria Abdominal: negative for nausea, vomiting, diarrhea, bright red blood per rectum, melena, or hematemesis Neurologic: negative for visual changes, syncope, or dizziness All other systems reviewed and are otherwise negative except as noted above.    Blood pressure 114/76, pulse 78, height 5\' 8"  (1.727 m), weight 175 lb (79.4 kg).  General  appearance: alert and no distress Neck: no adenopathy, no carotid bruit, no JVD, supple, symmetrical, trachea midline and thyroid not enlarged, symmetric, no tenderness/mass/nodules Lungs: clear to auscultation bilaterally Heart: . Crisp Mechanical aortic valve click Extremities: extremities normal, atraumatic, no cyanosis or edema Pulses: 2+ and symmetric Skin: Skin color, texture, turgor normal. No rashes or lesions Neurologic: Alert and oriented X 3, normal strength and tone. Normal symmetric reflexes. Normal coordination and gait  EKG sinus rhythm at 79 with evidence of LVH and nonspecific ST and T-wave changes. Personally reviewed his EKG.  ASSESSMENT AND PLAN:   Renal artery stenosis (HCC) History of renal artery stenosis status post left renal artery stenting 02/21/15 with an excellent angiographic and clinical result. Blood pressure was much better controlled after that. His last renal Doppler study performed 07/15/16 revealed this to be widely patent. His blood pressure continues to be well controlled. Will recheck renal Doppler studies.  Claudication [facility]) History of remote aortobifemoral bypass grafting by Dr. early. He did have claudication with angiography revealing an 80% focal mid left SFA stenosis as well as high-grade mid right SFA stenosis.  I performed directional atherectomy of his right SFA via the antegrade approach and got an excellent angiographic result. He came back with restenosis and I restudied him 10/01/16. The antegrade approach and intervened on his mid right SFA on his ostial right SFA with overlapping Silver stents. This claudication resolved and his most recent Dopplers performed 06/23/17 revealed a widely patent proximal SFA stent and mid right SFA.      Lorretta Harp MD FACP,FACC,FAHA, [facility] 11/05/2017 3:11 PM

## 2017-11-05 NOTE — Assessment & Plan Note (Signed)
History of renal artery stenosis status post left renal artery stenting 02/21/15 with an excellent angiographic and clinical result. Blood pressure was much better controlled after that. His last renal Doppler study performed 07/15/16 revealed this to be widely patent. His blood pressure continues to be well controlled. Will recheck renal Doppler studies.

## 2017-11-05 NOTE — Assessment & Plan Note (Signed)
History of remote aortobifemoral bypass grafting by Dr. early. He did have claudication with angiography revealing an 80% focal mid left SFA stenosis as well as high-grade mid right SFA stenosis. I performed directional atherectomy of his right SFA via the antegrade approach and got an excellent angiographic result. He came back with restenosis and I restudied him 10/01/16. The antegrade approach and intervened on his mid right SFA on his ostial right SFA with overlapping Silver stents. This claudication resolved and his most recent Dopplers performed 06/23/17 revealed a widely patent proximal SFA stent and mid right SFA.

## 2017-11-05 NOTE — Patient Instructions (Signed)
Medication Instructions: Your physician recommends that you continue on your current medications as directed. Please refer to the Current Medication list given to you today.   Testing/Procedures: Your physician has requested that you have a carotid duplex. This test is an ultrasound of the carotid arteries in your neck. It looks at blood flow through these arteries that supply the brain with blood. Allow one hour for this exam. There are no restrictions or special instructions.  Your physician has requested that you have a renal artery duplex. During this test, an ultrasound is used to evaluate blood flow to the kidneys. Allow one hour for this exam. Do not eat after midnight the day before and avoid carbonated beverages. Take your medications as you usually do.  Follow-Up: Your physician wants you to follow-up in: 1 year with Dr. Gwenlyn Found. You will receive a reminder letter in the mail two months in advance. If you don't receive a letter, please call our office to schedule the follow-up appointment.  If you need a refill on your cardiac medications before your next appointment, please call your pharmacy.

## 2017-12-02 ENCOUNTER — Other Ambulatory Visit: Payer: Self-pay | Admitting: Cardiovascular Disease

## 2017-12-02 DIAGNOSIS — I701 Atherosclerosis of renal artery: Secondary | ICD-10-CM

## 2017-12-08 ENCOUNTER — Ambulatory Visit (HOSPITAL_COMMUNITY)
Admission: RE | Admit: 2017-12-08 | Payer: Medicare Other | Source: Ambulatory Visit | Attending: Cardiovascular Disease | Admitting: Cardiovascular Disease

## 2017-12-13 ENCOUNTER — Emergency Department (HOSPITAL_COMMUNITY): Payer: Medicare Other

## 2017-12-13 ENCOUNTER — Other Ambulatory Visit: Payer: Self-pay

## 2017-12-13 ENCOUNTER — Encounter (HOSPITAL_COMMUNITY): Payer: Self-pay | Admitting: Emergency Medicine

## 2017-12-13 ENCOUNTER — Inpatient Hospital Stay (HOSPITAL_COMMUNITY)
Admission: EM | Admit: 2017-12-13 | Discharge: 2017-12-18 | DRG: 065 | Disposition: A | Discharge status: Home / Self Care | Payer: Medicare Other | Attending: Internal Medicine | Admitting: Internal Medicine

## 2017-12-13 DIAGNOSIS — I639 Cerebral infarction, unspecified: Secondary | ICD-10-CM | POA: Diagnosis present

## 2017-12-13 DIAGNOSIS — Z7901 Long term (current) use of anticoagulants: Secondary | ICD-10-CM

## 2017-12-13 DIAGNOSIS — R471 Dysarthria and anarthria: Secondary | ICD-10-CM | POA: Diagnosis present

## 2017-12-13 DIAGNOSIS — Z7984 Long term (current) use of oral hypoglycemic drugs: Secondary | ICD-10-CM

## 2017-12-13 DIAGNOSIS — Z9841 Cataract extraction status, right eye: Secondary | ICD-10-CM

## 2017-12-13 DIAGNOSIS — Z833 Family history of diabetes mellitus: Secondary | ICD-10-CM

## 2017-12-13 DIAGNOSIS — I10 Essential (primary) hypertension: Secondary | ICD-10-CM | POA: Diagnosis present

## 2017-12-13 DIAGNOSIS — D693 Immune thrombocytopenic purpura: Secondary | ICD-10-CM | POA: Diagnosis present

## 2017-12-13 DIAGNOSIS — Z8249 Family history of ischemic heart disease and other diseases of the circulatory system: Secondary | ICD-10-CM

## 2017-12-13 DIAGNOSIS — R29810 Facial weakness: Secondary | ICD-10-CM | POA: Diagnosis present

## 2017-12-13 DIAGNOSIS — R4781 Slurred speech: Secondary | ICD-10-CM | POA: Diagnosis not present

## 2017-12-13 DIAGNOSIS — R2990 Unspecified symptoms and signs involving the nervous system: Secondary | ICD-10-CM | POA: Diagnosis not present

## 2017-12-13 DIAGNOSIS — Z86010 Personal history of colonic polyps: Secondary | ICD-10-CM

## 2017-12-13 DIAGNOSIS — Z79899 Other long term (current) drug therapy: Secondary | ICD-10-CM

## 2017-12-13 DIAGNOSIS — Z952 Presence of prosthetic heart valve: Secondary | ICD-10-CM | POA: Diagnosis not present

## 2017-12-13 DIAGNOSIS — E1149 Type 2 diabetes mellitus with other diabetic neurological complication: Secondary | ICD-10-CM

## 2017-12-13 DIAGNOSIS — G459 Transient cerebral ischemic attack, unspecified: Secondary | ICD-10-CM | POA: Diagnosis present

## 2017-12-13 DIAGNOSIS — Z9081 Acquired absence of spleen: Secondary | ICD-10-CM

## 2017-12-13 DIAGNOSIS — R29703 NIHSS score 3: Secondary | ICD-10-CM | POA: Diagnosis present

## 2017-12-13 DIAGNOSIS — I4891 Unspecified atrial fibrillation: Secondary | ICD-10-CM | POA: Diagnosis present

## 2017-12-13 DIAGNOSIS — I7409 Other arterial embolism and thrombosis of abdominal aorta: Secondary | ICD-10-CM | POA: Diagnosis not present

## 2017-12-13 DIAGNOSIS — I63412 Cerebral infarction due to embolism of left middle cerebral artery: Secondary | ICD-10-CM | POA: Diagnosis not present

## 2017-12-13 DIAGNOSIS — R791 Abnormal coagulation profile: Secondary | ICD-10-CM | POA: Diagnosis present

## 2017-12-13 DIAGNOSIS — E7800 Pure hypercholesterolemia, unspecified: Secondary | ICD-10-CM | POA: Diagnosis not present

## 2017-12-13 DIAGNOSIS — Z803 Family history of malignant neoplasm of breast: Secondary | ICD-10-CM

## 2017-12-13 DIAGNOSIS — I2510 Atherosclerotic heart disease of native coronary artery without angina pectoris: Secondary | ICD-10-CM | POA: Diagnosis present

## 2017-12-13 DIAGNOSIS — E1151 Type 2 diabetes mellitus with diabetic peripheral angiopathy without gangrene: Secondary | ICD-10-CM | POA: Diagnosis present

## 2017-12-13 DIAGNOSIS — E876 Hypokalemia: Secondary | ICD-10-CM | POA: Diagnosis present

## 2017-12-13 DIAGNOSIS — Z8673 Personal history of transient ischemic attack (TIA), and cerebral infarction without residual deficits: Secondary | ICD-10-CM

## 2017-12-13 DIAGNOSIS — Z9842 Cataract extraction status, left eye: Secondary | ICD-10-CM

## 2017-12-13 DIAGNOSIS — R200 Anesthesia of skin: Secondary | ICD-10-CM | POA: Diagnosis present

## 2017-12-13 DIAGNOSIS — R531 Weakness: Secondary | ICD-10-CM | POA: Diagnosis not present

## 2017-12-13 DIAGNOSIS — Z87891 Personal history of nicotine dependence: Secondary | ICD-10-CM

## 2017-12-13 DIAGNOSIS — R011 Cardiac murmur, unspecified: Secondary | ICD-10-CM | POA: Diagnosis present

## 2017-12-13 DIAGNOSIS — E785 Hyperlipidemia, unspecified: Secondary | ICD-10-CM | POA: Diagnosis present

## 2017-12-13 DIAGNOSIS — E119 Type 2 diabetes mellitus without complications: Secondary | ICD-10-CM

## 2017-12-13 DIAGNOSIS — Z951 Presence of aortocoronary bypass graft: Secondary | ICD-10-CM

## 2017-12-13 DIAGNOSIS — I359 Nonrheumatic aortic valve disorder, unspecified: Secondary | ICD-10-CM | POA: Diagnosis present

## 2017-12-13 DIAGNOSIS — Z961 Presence of intraocular lens: Secondary | ICD-10-CM | POA: Diagnosis present

## 2017-12-13 DIAGNOSIS — I701 Atherosclerosis of renal artery: Secondary | ICD-10-CM | POA: Diagnosis present

## 2017-12-13 DIAGNOSIS — R29818 Other symptoms and signs involving the nervous system: Secondary | ICD-10-CM | POA: Diagnosis not present

## 2017-12-13 DIAGNOSIS — I739 Peripheral vascular disease, unspecified: Secondary | ICD-10-CM | POA: Diagnosis present

## 2017-12-13 DIAGNOSIS — R4701 Aphasia: Secondary | ICD-10-CM | POA: Diagnosis present

## 2017-12-13 DIAGNOSIS — Z95820 Peripheral vascular angioplasty status with implants and grafts: Secondary | ICD-10-CM

## 2017-12-13 HISTORY — DX: Cerebral infarction, unspecified: I63.9

## 2017-12-13 LAB — COMPREHENSIVE METABOLIC PANEL
ALK PHOS: 56 U/L (ref 38–126)
ALT: 26 U/L (ref 17–63)
AST: 28 U/L (ref 15–41)
Albumin: 3.9 g/dL (ref 3.5–5.0)
Anion gap: 10 (ref 5–15)
BILIRUBIN TOTAL: 1.3 mg/dL — AB (ref 0.3–1.2)
BUN: 11 mg/dL (ref 6–20)
CALCIUM: 9.1 mg/dL (ref 8.9–10.3)
CO2: 27 mmol/L (ref 22–32)
CREATININE: 0.98 mg/dL (ref 0.61–1.24)
Chloride: 102 mmol/L (ref 101–111)
GFR calc Af Amer: >60 mL/min (ref >60)
Glucose, Bld: 168 mg/dL — ABNORMAL HIGH (ref 65–99)
POTASSIUM: 4.4 mmol/L (ref 3.5–5.1)
Sodium: 139 mmol/L (ref 135–145)
TOTAL PROTEIN: 7 g/dL (ref 6.5–8.1)

## 2017-12-13 LAB — DIFFERENTIAL
BASOS ABS: 0 10*3/uL (ref 0.0–0.1)
Basophils Relative: 1 %
EOS ABS: 0.1 10*3/uL (ref 0.0–0.7)
EOS PCT: 1 %
LYMPHS ABS: 1.7 10*3/uL (ref 0.7–4.0)
Lymphocytes Relative: 27 %
MONOS PCT: 11 %
Monocytes Absolute: 0.7 10*3/uL (ref 0.1–1.0)
Neutro Abs: 3.8 10*3/uL (ref 1.7–7.7)
Neutrophils Relative %: 60 %

## 2017-12-13 LAB — CBG MONITORING, ED: Glucose-Capillary: 146 mg/dL — ABNORMAL HIGH (ref 65–99)

## 2017-12-13 LAB — I-STAT CHEM 8, ED
BUN: 12 mg/dL (ref 6–20)
CHLORIDE: 101 mmol/L (ref 101–111)
CREATININE: 1 mg/dL (ref 0.61–1.24)
Calcium, Ion: 1.12 mmol/L — ABNORMAL LOW (ref 1.15–1.40)
GLUCOSE: 170 mg/dL — AB (ref 65–99)
HCT: 48 % (ref 39.0–52.0)
Hemoglobin: 16.3 g/dL (ref 13.0–17.0)
Potassium: 4.3 mmol/L (ref 3.5–5.1)
Sodium: 141 mmol/L (ref 135–145)
TCO2: 32 mmol/L (ref 22–32)

## 2017-12-13 LAB — PROTIME-INR
INR: 1.26
Prothrombin Time: 15.7 seconds — ABNORMAL HIGH (ref 11.4–15.2)

## 2017-12-13 LAB — CBC
HEMATOCRIT: 47 % (ref 39.0–52.0)
HEMOGLOBIN: 15.5 g/dL (ref 13.0–17.0)
MCH: 31.5 pg (ref 26.0–34.0)
MCHC: 33 g/dL (ref 30.0–36.0)
MCV: 95.5 fL (ref 78.0–100.0)
Platelets: 221 10*3/uL (ref 150–400)
RBC: 4.92 MIL/uL (ref 4.22–5.81)
RDW: 14.1 % (ref 11.5–15.5)
WBC: 6.2 10*3/uL (ref 4.0–10.5)

## 2017-12-13 LAB — URINALYSIS, ROUTINE W REFLEX MICROSCOPIC
Bacteria, UA: NONE SEEN
Bilirubin Urine: NEGATIVE
HGB URINE DIPSTICK: NEGATIVE
KETONES UR: 5 mg/dL — AB
LEUKOCYTES UA: NEGATIVE
Nitrite: NEGATIVE
PROTEIN: NEGATIVE mg/dL
Specific Gravity, Urine: 1.015 (ref 1.005–1.030)
pH: 7 (ref 5.0–8.0)

## 2017-12-13 LAB — LIPID PANEL
CHOLESTEROL: 173 mg/dL (ref 0–200)
HDL: 66 mg/dL (ref >40)
LDL Cholesterol: 92 mg/dL (ref 0–99)
Total CHOL/HDL Ratio: 2.6 RATIO
Triglycerides: 75 mg/dL (ref <150)
VLDL: 15 mg/dL (ref 0–40)

## 2017-12-13 LAB — GLUCOSE, CAPILLARY: GLUCOSE-CAPILLARY: 211 mg/dL — AB (ref 65–99)

## 2017-12-13 LAB — HEMOGLOBIN A1C
Hgb A1c MFr Bld: 6.8 % — ABNORMAL HIGH (ref 4.8–5.6)
MEAN PLASMA GLUCOSE: 148.46 mg/dL

## 2017-12-13 LAB — TSH: TSH: 2.178 u[IU]/mL (ref 0.350–4.500)

## 2017-12-13 LAB — I-STAT TROPONIN, ED: TROPONIN I, POC: 0 ng/mL (ref 0.00–0.08)

## 2017-12-13 LAB — APTT: aPTT: 32 seconds (ref 24–36)

## 2017-12-13 MED ORDER — INSULIN ASPART 100 UNIT/ML ~~LOC~~ SOLN
0.0000 [IU] | Freq: Three times a day (TID) | SUBCUTANEOUS | Status: DC
  Administered 2017-12-14: 1 [IU] via SUBCUTANEOUS
  Administered 2017-12-14: 3 [IU] via SUBCUTANEOUS
  Administered 2017-12-14: 1 [IU] via SUBCUTANEOUS
  Administered 2017-12-15: 2 [IU] via SUBCUTANEOUS
  Administered 2017-12-15 – 2017-12-16 (×2): 1 [IU] via SUBCUTANEOUS
  Administered 2017-12-16: 2 [IU] via SUBCUTANEOUS
  Administered 2017-12-17: 3 [IU] via SUBCUTANEOUS
  Administered 2017-12-17: 2 [IU] via SUBCUTANEOUS
  Administered 2017-12-17 – 2017-12-18 (×3): 3 [IU] via SUBCUTANEOUS

## 2017-12-13 MED ORDER — ACETAMINOPHEN 325 MG PO TABS: 650.0000 mg | ORAL_TABLET | Freq: Four times a day (QID) | ORAL | Status: DC | PRN

## 2017-12-13 MED ORDER — SODIUM CHLORIDE 0.9 % IV SOLN
INTRAVENOUS | Status: DC
  Administered 2017-12-13 – 2017-12-15 (×4): via INTRAVENOUS

## 2017-12-13 MED ORDER — MELATONIN 3 MG PO TABS
1.0000 | ORAL_TABLET | Freq: Every day | ORAL | Status: DC
  Administered 2017-12-13 – 2017-12-17 (×5): 3 mg via ORAL
  Filled 2017-12-13 (×6): qty 1

## 2017-12-13 MED ORDER — HEPARIN (PORCINE) IN NACL 100-0.45 UNIT/ML-% IJ SOLN
1400.0000 [IU]/h | INTRAMUSCULAR | Status: DC
  Administered 2017-12-13: 750 [IU]/h via INTRAVENOUS
  Administered 2017-12-15: 1200 [IU]/h via INTRAVENOUS
  Administered 2017-12-15 – 2017-12-18 (×4): 1400 [IU]/h via INTRAVENOUS
  Filled 2017-12-13 (×8): qty 250

## 2017-12-13 MED ORDER — DEXTROSE 5 % IV SOLN: 1.0000 g | INTRAVENOUS | Status: DC

## 2017-12-13 MED ORDER — ACETAMINOPHEN 650 MG RE SUPP: 650.0000 mg | Freq: Four times a day (QID) | RECTAL | Status: DC | PRN

## 2017-12-13 MED ORDER — ONDANSETRON HCL 4 MG/2ML IJ SOLN: 4.0000 mg | Freq: Four times a day (QID) | INTRAMUSCULAR | Status: DC | PRN

## 2017-12-13 MED ORDER — ASPIRIN EC 81 MG PO TBEC
81.0000 mg | DELAYED_RELEASE_TABLET | Freq: Every day | ORAL | Status: DC
  Administered 2017-12-14: 81 mg via ORAL
  Filled 2017-12-13: qty 1

## 2017-12-13 MED ORDER — WARFARIN SODIUM 10 MG PO TABS
11.2500 mg | ORAL_TABLET | Freq: Once | ORAL | Status: AC
  Administered 2017-12-13: 11.25 mg via ORAL
  Filled 2017-12-13 (×2): qty 1

## 2017-12-13 MED ORDER — ONDANSETRON HCL 4 MG PO TABS: 4.0000 mg | ORAL_TABLET | Freq: Four times a day (QID) | ORAL | Status: DC | PRN

## 2017-12-13 MED ORDER — LORAZEPAM 2 MG/ML IJ SOLN
0.5000 mg | Freq: Once | INTRAMUSCULAR | Status: AC
  Administered 2017-12-13: 0.5 mg via INTRAVENOUS
  Filled 2017-12-13: qty 1

## 2017-12-13 MED ORDER — WARFARIN SODIUM 10 MG PO TABS
10.0000 mg | ORAL_TABLET | Freq: Once | ORAL | Status: DC
  Filled 2017-12-13: qty 1

## 2017-12-13 MED ORDER — SENNOSIDES-DOCUSATE SODIUM 8.6-50 MG PO TABS: 1.0000 | ORAL_TABLET | Freq: Every evening | ORAL | Status: DC | PRN

## 2017-12-13 MED ORDER — BISACODYL 10 MG RE SUPP: 10.0000 mg | Freq: Every day | RECTAL | Status: DC | PRN

## 2017-12-13 MED ORDER — ATORVASTATIN CALCIUM 80 MG PO TABS
80.0000 mg | ORAL_TABLET | Freq: Every day | ORAL | Status: DC
  Administered 2017-12-14 – 2017-12-18 (×5): 80 mg via ORAL
  Filled 2017-12-13 (×5): qty 1

## 2017-12-13 MED ORDER — WARFARIN - PHARMACIST DOSING INPATIENT
Freq: Every day | Status: DC
  Administered 2017-12-15 – 2017-12-16 (×2)

## 2017-12-13 MED ORDER — WARFARIN SODIUM 10 MG PO TABS
11.2500 mg | ORAL_TABLET | Freq: Once | ORAL | Status: AC
  Filled 2017-12-13: qty 1

## 2017-12-13 MED ORDER — HYDROCODONE-ACETAMINOPHEN 5-325 MG PO TABS: 1.0000 | ORAL_TABLET | ORAL | Status: DC | PRN

## 2017-12-13 NOTE — Consult Note (Signed)
Requesting Physician: Dr. Lorin Mercy    Chief Complaint: Right facial droop, dysarthria, intermittent right arm numbness  History obtained from:  Patient   Ch  HPI:                                                                                                                                         [name] [name] is an 72 y.o. male with history of type 2 diabetes, stroke, PVD, hypertension, hyperlipidemia, ITP, claudication, aortic stenosis, aortic valve replacement.  Patient currently is on Coumadin with an INR of 1.29.  Patient states that over prolonged period of time he had had intermittent right facial numbness and right arm numbness that would last for approximately 10 to up to 40 minutes but then resolved.  He never seek attention as he did not feel the knees were that important and they would resolve.  This past Saturday he noted that he had a right facial droop, dysarthria which was worse than normal, and right arm numbness that was intermittent.  Unfortunately, these symptoms did not resolve as they usually do thus patient came to the ED.  Currently he still has a right facial droop, moderate dysarthria which she says actually is waxing and waning while he has been in the emergency department 5/10 but no arm symptoms.  Date last known well: Date: 12/11/2017 Time last known well: Unable to determine tPA Given: No: Out of the window NIH stroke scale 3 Modified Rankin: Rankin Score=0   Past Medical History:  Diagnosis Date  . Adenomatous colon polyp 09/1997  . Anemia   . Aortic stenosis    s/p st. jude mechanical AVR - Chronic Coumadin  . Blood transfusion    "related to ITP"  . Bronchitis 12/17/2011   "just gettin over a case"  . Claudication ([place])   . Coronary artery disease    s/p cabg x 3 11/2003: lima-lad, seq vg to rpda and rpl  . Diverticulitis of colon   . Heart murmur   . Hyperlipidemia   . Hypertension   . ITP (idiopathic thrombocytopenic purpura)   . Peripheral  arterial disease ([place])    a. history of aortobifemoral bypass grafting by Dr. Sherren Mocha early b. LE angiography 04/22/2015 patent aortobifem graft, DES to R SFA  . Peripheral vascular disease ([place])    s/p Left external Iliac Artery stenting and subsequent left femoral endarterectomy 02/2011- post op course complicated by wound infxn req I&D 03/2011  . Renal artery stenosis, native, bilateral (HCC)    a. bilateral renal artery stenosis by recent duplex ultrasound b. L renal artery stent 02/2015, R renal artery patent on angiogram  . Stroke ([place])   . TIA (transient ischemic attack) ~ 2013  . Type II diabetes mellitus ([place])     Past Surgical History:  Procedure Laterality Date  . ABDOMINAL AORTAGRAM N/A 12/16/2011   Procedure: ABDOMINAL AORTAGRAM;  Surgeon:  Sherren Mocha, MD;  Location: [facility] CATH LAB;  Service: Cardiovascular;  Laterality: N/A;  . ANGIOPLASTY / STENTING ILIAC     Left external Iliac Artery  . AORTA - BILATERAL FEMORAL ARTERY BYPASS GRAFT  01/18/2012   Procedure: AORTA BIFEMORAL BYPASS GRAFT;  Surgeon: Curt Jews, MD;  Location: [place];  Service: Vascular;  Laterality: N/A;  . AORTIC VALVE REPLACEMENT  ~ 2004  . CARDIAC CATHETERIZATION  11/2003   /pt report 10/01/2016  . CARDIAC VALVE REPLACEMENT  11/2003   aortic  . CATARACT EXTRACTION W/ INTRAOCULAR LENS  IMPLANT, BILATERAL Bilateral   . CORONARY ARTERY BYPASS GRAFT  11/2003   Archie Endo 04/21/2011  . LOWER EXTREMITY ANGIOGRAM N/A 02/21/2015   Procedure: LOWER EXTREMITY ANGIOGRAM;  Surgeon: Lorretta Harp, MD;  Location: [facility] CATH LAB;  Service: Cardiovascular;  Laterality: N/A;  . PERIPHERAL VASCULAR CATHETERIZATION N/A 04/22/2015   Procedure: Lower Extremity Angiography;  Surgeon: Lorretta Harp, MD;  Location: [place] CV LAB;  Service: Cardiovascular;  Laterality: N/A;  . PERIPHERAL VASCULAR CATHETERIZATION N/A 08/24/2016   Procedure: Lower Extremity Angiography;  Surgeon: Lorretta Harp, MD;  Location: [place] CV LAB;   Service: Cardiovascular;  Laterality: N/A;  . PERIPHERAL VASCULAR CATHETERIZATION Right 10/01/2016   Procedure: Peripheral Vascular Intervention - STENT;  Surgeon: Lorretta Harp, MD;  Location: [place] CV LAB;  Service: Cardiovascular;  Laterality: Right;  Prox and MID SFA   . RENAL ANGIOGRAM N/A 02/21/2015   Procedure: RENAL ANGIOGRAM;  Surgeon: Lorretta Harp, MD;  Location: [facility] CATH LAB;  Service: Cardiovascular;  Laterality: Bilateral; 6 mm x 12 mm long Herculink balloon expandable stent to the left renal artery  . RENAL ARTERY STENT Left 04/22/2015   dr berry  . SPLENECTOMY  02/2003   Archie Endo 04/21/2011  . TONSILLECTOMY  ~ 1952    Family History  Problem Relation Age of Onset  . Coronary artery disease Mother        bypass surgery - deceased  . Heart disease Father        murmur, valve replacement - deceased  . Breast cancer Sister   . Diabetes Unknown        grandmother  . Diabetes Paternal Grandmother   . Diabetes Paternal Aunt   . Colon cancer Neg Hx    Social History:  reports that he quit smoking about 24 years ago. His smoking use included cigarettes and cigars. He quit after 30.00 years of use. he has never used smokeless tobacco. He reports that he drinks about 25.2 oz of alcohol per week. He reports that he does not use drugs.  Allergies: No Known Allergies  Medications:                                                                                                                           Current Facility-Administered Medications  Medication Dose Route Frequency Provider Last Rate Last Dose  . 0.9 %  sodium chloride  infusion   Intravenous Continuous Rondel Jumbo, PA-C      . acetaminophen (TYLENOL) tablet 650 mg  650 mg Oral Q6H PRN Rondel Jumbo, PA-C       Or  . acetaminophen (TYLENOL) suppository 650 mg  650 mg Rectal Q6H PRN Rondel Jumbo, PA-C      . [START ON 12/14/2017] atorvastatin (LIPITOR) tablet 80 mg  80 mg Oral Daily Wertman, Sara E, PA-C       . bisacodyl (DULCOLAX) suppository 10 mg  10 mg Rectal Daily PRN Rondel Jumbo, PA-C      . HYDROcodone-acetaminophen (NORCO/VICODIN) 5-325 MG per tablet 1-2 tablet  1-2 tablet Oral Q4H PRN Sharene Butters E, PA-C      . insulin aspart (novoLOG) injection 0-9 Units  0-9 Units Subcutaneous TID WC Wertman, Coralee Pesa, PA-C      . Melatonin TABS 3 mg  1 tablet Oral QHS Rondel Jumbo, PA-C      . ondansetron [facility]) tablet 4 mg  4 mg Oral Q6H PRN Rondel Jumbo, PA-C       Or  . ondansetron [facility]) injection 4 mg  4 mg Intravenous Q6H PRN Rondel Jumbo, PA-C      . senna-docusate (Senokot-S) tablet 1 tablet  1 tablet Oral QHS PRN Rondel Jumbo, PA-C      . [START ON 12/14/2017] Warfarin - Pharmacist Dosing Inpatient   Does not apply q1800 Rumbarger, Valeda Malm [facility]       Current Outpatient Medications  Medication Sig Dispense Refill  . amLODipine (NORVASC) 10 MG tablet Take 10 mg by mouth daily.  5  . atorvastatin (LIPITOR) 80 MG tablet Take 80 mg by mouth daily.     . canagliflozin (INVOKANA) 100 MG TABS tablet Take 100 mg by mouth daily.    [name] glyBURIDE (DIABETA) 2.5 MG tablet Take 5 mg by mouth daily with breakfast.     . hydrochlorothiazide (MICROZIDE) 12.5 MG capsule TAKE ONE CAPSULE BY MOUTH EVERY DAY 90 capsule 3  . Melatonin 5 MG TABS Take 1 tablet by mouth at bedtime.    . metoprolol succinate (TOPROL-XL) 50 MG 24 hr tablet Take 1 tablet (50 mg total) by mouth daily. Take with or immediately following a meal. 30 tablet 11  . sildenafil (REVATIO) 20 MG tablet Take 20 mg by mouth as directed.    . trandolapril (MAVIK) 4 MG tablet Take 1 tablet (4 mg total) by mouth daily. 30 tablet 6  . warfarin (COUMADIN) 7.5 MG tablet TAKE AS DIRECTED BY COUMADIN CLINIC 40 tablet 3  . enoxaparin (LOVENOX) 80 MG/0.8ML injection Inject 0.8 mLs (80 mg total) into the skin every 12 (twelve) hours. Take as instructed by Coumadin Clinic (Patient not taking: Reported on 12/13/2017) 10 Syringe 1     ROS:  History obtained from the patient  General ROS: negative for - chills, fatigue, fever, night sweats, weight gain or weight loss Psychological ROS: negative for - behavioral disorder, hallucinations, memory difficulties, mood swings or suicidal ideation Ophthalmic ROS: negative for - blurry vision, double vision, eye pain or loss of vision ENT ROS: negative for - epistaxis, nasal discharge, oral lesions, sore throat, tinnitus or vertigo Allergy and Immunology ROS: negative for - hives or itchy/watery eyes Hematological and Lymphatic ROS: negative for - bleeding problems, bruising or swollen lymph nodes Endocrine ROS: negative for - galactorrhea, hair pattern changes, polydipsia/polyuria or temperature intolerance Respiratory ROS: negative for - cough, hemoptysis, shortness of breath or wheezing Cardiovascular ROS: negative for - chest pain, dyspnea on exertion, edema or irregular heartbeat Gastrointestinal ROS: negative for - abdominal pain, diarrhea, hematemesis, nausea/vomiting or stool incontinence Genito-Urinary ROS: negative for - dysuria, hematuria, incontinence or urinary frequency/urgency Musculoskeletal ROS: negative for - joint swelling or muscular weakness Neurological ROS: as noted in HPI Dermatological ROS: negative for rash and skin lesion changes  Neurologic Examination:                                                                                                      Blood pressure (!) 150/60, pulse 78, temperature 98 F (36.7 C), temperature source Oral, resp. rate 16, height 5\' 8"  (1.727 m), weight 74.8 kg (165 lb), SpO2 100 %.  HEENT-  Normocephalic, no lesions, without obvious abnormality.  Normal external eye and conjunctiva.  Normal TM's bilaterally.  Normal auditory canals and external ears. Normal external nose, mucus membranes and septum.   Normal pharynx. Cardiovascular- S1, S2 normal, pulses palpable throughout   Lungs- chest clear, no wheezing, rales, normal symmetric air entry Abdomen- normal findings: bowel sounds normal Extremities- no edema Lymph-no adenopathy palpable Musculoskeletal-no joint tenderness, deformity or swelling Skin-warm and dry, no hyperpigmentation, vitiligo, or suspicious lesions  Neurological Examination Mental Status: Alert, oriented, thought content appropriate.  Speech dysarthric without evidence of aphasia.  Able to follow 3 step commands without difficulty. Cranial Nerves: II:  Visual fields grossly normal,  III,IV, VI: ptosis not present, extra-ocular motions intact bilaterally, pupils equal, round, reactive to light and accommodation V,VII: smile asymmetric on the right, facial light touch sensation normal bilaterally at this time VIII: hearing normal bilaterally IX,X: uvula rises symmetrically XI: bilateral shoulder shrug XII: midline tongue extension Motor: Right : Upper extremity   5/5    Left:     Upper extremity   5/5  Lower extremity   5/5     Lower extremity   5/5 Tone and bulk:normal tone throughout; no atrophy noted Sensory: Pinprick and light touch intact throughout, bilaterally Deep Tendon Reflexes: 2+ and symmetric throughout with no ankle jerk Plantars: Right: downgoing   Left: downgoing Cerebellar: normal finger-to-nose, and normal heel-to-shin test Gait: Not tested       Lab Results: Basic Metabolic Panel: Recent Labs  Lab 12/13/17 1028 12/13/17 1053  NA 139 141  K 4.4 4.3  CL 102 101  CO2 27  --   GLUCOSE 168* 170*  BUN 11 12  CREATININE 0.98 1.00  CALCIUM 9.1  --     Liver Function Tests: Recent Labs  Lab 12/13/17 1028  AST 28  ALT 26  ALKPHOS 56  BILITOT 1.3*  PROT 7.0  ALBUMIN 3.9   No results for input(s): LIPASE, AMYLASE in the last 168 hours. No results for input(s): AMMONIA in the last 168 hours.  CBC: Recent Labs  Lab  12/13/17 1028 12/13/17 1053  WBC 6.2  --   NEUTROABS 3.8  --   HGB 15.5 16.3  HCT 47.0 48.0  MCV 95.5  --   PLT 221  --     Cardiac Enzymes: No results for input(s): CKTOTAL, CKMB, CKMBINDEX, TROPONINI in the last 168 hours.  Lipid Panel: No results for input(s): CHOL, TRIG, HDL, CHOLHDL, VLDL, LDLCALC in the last 168 hours.  CBG: Recent Labs  Lab 12/13/17 1125  GLUCAP 146*    Microbiology: Results for orders placed or performed in visit on 11/01/13  Fecal occult blood, imunochemical (IFOB)     Status: None   Collection Time: 11/01/13 12:19 PM  Result Value Ref Range Status   Fecal Occult Bld Negative Negative Final    Coagulation Studies: Recent Labs    12/13/17 1028  LABPROT 15.7*  INR 1.26    Imaging: Ct Head Wo Contrast  Result Date: 12/13/2017 CLINICAL DATA:  Right-sided numbness since 12/11/2017. Onset of right facial droop 12/12/2017. EXAM: CT HEAD WITHOUT CONTRAST TECHNIQUE: Contiguous axial images were obtained from the base of the skull through the vertex without intravenous contrast. COMPARISON:  Brain MRI and head CT scan 05/05/2013. FINDINGS: Brain: There is cortical atrophy and chronic microvascular ischemic change. No evidence of acute abnormality including hemorrhage, infarct, mass lesion, mass effect, midline shift or abnormal extra axial fluid collection. No hydrocephalus or pneumocephalus. Vascular: Atherosclerosis noted. Skull: Intact. Sinuses/Orbits: Negative. Other: None. IMPRESSION: No acute abnormality. Atrophy and chronic microvascular ischemic change. Atherosclerosis. Electronically Signed   By: Inge Rise M.D.   On: 12/13/2017 11:12       Assessment and plan discussed with with attending physician and they are in agreement.    Etta Quill PA-C [facility] [phone]  12/13/2017, 2:06 PM   Assessment: 72 y.o. male presenting to the emergency department with a small left cortical stroke in the setting of subtherapeutic  INR.  Though with any stroke there is some small risk of hemorrhagic conversion, with the size of this being small and the fact that his anticoagulation indication is mechanical valve, I think that the risk of hemorrhage is low and therefore the risk of not pursuing anticoagulation is higher than the risk of anticoagulation.   Stroke Risk Factors - diabetes mellitus, hyperlipidemia and hypertension  Recommend 1. HgbA1c, fasting lipid panel 2. MRI of the brain without contrast 3. PT consult, OT consult, Speech consult 4. Echocardiogram 5. 80 mg of Atorvistatin 6. Prophylactic therapy-Antiplatelet med: Continue Coumadin for now, heparin bridge 7. Risk factor modification 8. Telemetry monitoring 9. Frequent neuro checks 10 NPO until passes stroke swallow screen 11 please page stroke NP  Or  PA  Or MD from 8am -4 pm  as this patient from this time will be  followed by the stroke.   You can look them up on [email or web address]  Password [email or web address]   Roland Rack, MD [facility] [phone]  If 7pm- 7am, please page neurology on call as listed in [place].

## 2017-12-13 NOTE — Progress Notes (Addendum)
ANTICOAGULATION CONSULT NOTE - Initial Consult  Pharmacy Consult for Heparin Indication: St. Jude Mech AVR, afib, CVA (new)  No Known Allergies  Patient Measurements: Height: 5\' 8"  (172.7 cm) Weight: 165 lb (74.8 kg) IBW/kg (Calculated) : 68.4 Heparin Dosing Weight: 74.8 kg  Vital Signs: Temp: 98.6 F (37 C) (01/07 1835) Temp Source: Oral (01/07 1835) BP: 159/47 (01/07 1835) Pulse Rate: 70 (01/07 1835)  Labs: Recent Labs    12/13/17 1028 12/13/17 1053  HGB 15.5 16.3  HCT 47.0 48.0  PLT 221  --   APTT 32  --   LABPROT 15.7*  --   INR 1.26  --   CREATININE 0.98 1.00    Estimated Creatinine Clearance: 64.6 mL/min (by C-G formula based on SCr of 1 mg/dL).   Medical History: Past Medical History:  Diagnosis Date  . Adenomatous colon polyp 09/1997  . Anemia   . Aortic stenosis    s/p st. jude mechanical AVR - Chronic Coumadin  . Blood transfusion    "related to ITP"  . Coronary artery disease    s/p cabg x 3 11/2003: lima-lad, seq vg to rpda and rpl  . Diverticulitis of colon   . Heart murmur   . Hyperlipidemia   . Hypertension   . ITP (idiopathic thrombocytopenic purpura)   . Peripheral arterial disease ([place])    a. history of aortobifemoral bypass grafting by Dr. Sherren Mocha early b. LE angiography 04/22/2015 patent aortobifem graft, DES to R SFA  . Peripheral vascular disease ([place])    s/p Left external Iliac Artery stenting and subsequent left femoral endarterectomy 02/2011- post op course complicated by wound infxn req I&D 03/2011  . Renal artery stenosis, native, bilateral (HCC)    a. bilateral renal artery stenosis by recent duplex ultrasound b. L renal artery stent 02/2015, R renal artery patent on angiogram  . Stroke ([place])   . TIA (transient ischemic attack) ~ 2013  . Type II diabetes mellitus (HCC)    Assessment:  Anticoag: Warfarin for hx St. Jude Mechanical AVR, Afib, CVA (INR goal 2.5-3.5), warfarin PTA dose 7.5mg  daily except 11.25mg  on Sunday (per last  Coumadin clinic visit), INR 1.26, CBC WNL  Goal of Therapy:  Heparin level 0.3-0.5  INR 2.5-3.5 Monitor platelets by anticoagulation protocol: Yes   Plan:  Start IV heparin (no bolus) at 750 units/hr Check heparin level in 6 hrs Daily Hl and CBC  [name] [name], PharmD, BCPS Clinical Staff Pharmacist Pager [phone]  [name] [name] 12/13/2017,7:38 PM

## 2017-12-13 NOTE — H&P (Signed)
History and Physical    [name] [name] UUV:[id number] DOB: 1946-03-07 DOA: 12/13/2017   PCP: Shon Baton, MD   Patient coming from:  Home    Chief Complaint: Right facial droop  HPI: [name] [name] is a 72 y.o. male with medical history significant for HTN, HLD, history of aortic stenosis status post St Jude mechanical AVR, on chronic Coumadin, history of PAD, history of renal artery stenosis, history of CAD status post CABG in 2004, diabetes, history of TIA and CVA in 2004, at the time with no residual, presenting to the emergency department with right facial droop since Saturday, accompanied by mild dysarthria, facial numbness.  He reports this symptom "coming and going ".  This morning, when he woke up, and his speech was worse, and the facial numbness was also not improving.  He denies any unilateral weakness decreased sensation in the extremities, and denies any gait instability. Denies vertigo dizziness or vision changes. Denies headaches, or dysphagia. No confusion or seizures. Denies any chest pain, or shortness of breath. Denies any fever or chills, or night sweats. No tobacco. No new meds or hormonal supplements. Does take a regular ASA a day, with no other antiplatelets or anticoagulants.Denies any recent long distance trips or recent surgeries. No sick contacts. No new stressors present in personal life Patient is compliant with his medications. .Patient is very active, exercising daily. He is not a diabetic. No family history of stroke Patient was not administered TPA as is beyond time window for treatment consideration. Will admit for further evaluation and treatment.   ED Course:  BP (!) 150/60 (BP Location: Right Arm)   Pulse 78   Temp 98 F (36.7 C) (Oral)   Resp 16   Ht 5\' 8"  (1.727 m)   Wt 74.8 kg (165 lb)   SpO2 100%   BMI 25.09 kg/m   Glucose 146, chemistries normal Troponin 0 Hemoglobin 16.3 CT of the head negative for acute intracranial  abnormalities T normal sinus rhythm, no ACS PT 15.7, INR 1.26.  Was 2.2 on 10/25/2017. Last 2D echo was in April 2017, showing grade 2 diastolic dysfunction, normal systolic on the left ventricle, EF 60-65%. MRI of the brain is pending NIH stroke scale is 3.  No TPA given due to patient being outside of the window  Review of Systems:  As per HPI otherwise all other systems reviewed and are negative  Past Medical History:  Diagnosis Date  . Adenomatous colon polyp 09/1997  . Anemia   . Aortic stenosis    s/p st. jude mechanical AVR - Chronic Coumadin  . Blood transfusion    "related to ITP"  . Bronchitis 12/17/2011   "just gettin over a case"  . Claudication ([place])   . Coronary artery disease    s/p cabg x 3 11/2003: lima-lad, seq vg to rpda and rpl  . Diverticulitis of colon   . Heart murmur   . Hyperlipidemia   . Hypertension   . ITP (idiopathic thrombocytopenic purpura)   . Peripheral arterial disease ([place])    a. history of aortobifemoral bypass grafting by Dr. Sherren Mocha early b. LE angiography 04/22/2015 patent aortobifem graft, DES to R SFA  . Peripheral vascular disease ([place])    s/p Left external Iliac Artery stenting and subsequent left femoral endarterectomy 02/2011- post op course complicated by wound infxn req I&D 03/2011  . Renal artery stenosis, native, bilateral (HCC)    a. bilateral renal artery stenosis by recent duplex ultrasound b. L  renal artery stent 02/2015, R renal artery patent on angiogram  . TIA (transient ischemic attack) ~ 2013  . Type II diabetes mellitus ([place])     Past Surgical History:  Procedure Laterality Date  . ABDOMINAL AORTAGRAM N/A 12/16/2011   Procedure: ABDOMINAL Maxcine Ham;  Surgeon: Sherren Mocha, MD;  Location: [facility] CATH LAB;  Service: Cardiovascular;  Laterality: N/A;  . ANGIOPLASTY / STENTING ILIAC     Left external Iliac Artery  . AORTA - BILATERAL FEMORAL ARTERY BYPASS GRAFT  01/18/2012   Procedure: AORTA BIFEMORAL BYPASS GRAFT;  Surgeon: Curt Jews, MD;  Location: [place];  Service: Vascular;  Laterality: N/A;  . AORTIC VALVE REPLACEMENT  ~ 2004  . CARDIAC CATHETERIZATION  11/2003   /pt report 10/01/2016  . CARDIAC VALVE REPLACEMENT     aortic  . CATARACT EXTRACTION W/ INTRAOCULAR LENS  IMPLANT, BILATERAL Bilateral   . CORONARY ARTERY BYPASS GRAFT  11/2003   Archie Endo 04/21/2011  . LOWER EXTREMITY ANGIOGRAM N/A 02/21/2015   Procedure: LOWER EXTREMITY ANGIOGRAM;  Surgeon: Lorretta Harp, MD;  Location: [facility] CATH LAB;  Service: Cardiovascular;  Laterality: N/A;  . PERIPHERAL VASCULAR CATHETERIZATION N/A 04/22/2015   Procedure: Lower Extremity Angiography;  Surgeon: Lorretta Harp, MD;  Location: [place] CV LAB;  Service: Cardiovascular;  Laterality: N/A;  . PERIPHERAL VASCULAR CATHETERIZATION N/A 08/24/2016   Procedure: Lower Extremity Angiography;  Surgeon: Lorretta Harp, MD;  Location: [place] CV LAB;  Service: Cardiovascular;  Laterality: N/A;  . PERIPHERAL VASCULAR CATHETERIZATION Right 10/01/2016   Procedure: Peripheral Vascular Intervention - STENT;  Surgeon: Lorretta Harp, MD;  Location: [place] CV LAB;  Service: Cardiovascular;  Laterality: Right;  Prox and MID SFA   . RENAL ANGIOGRAM N/A 02/21/2015   Procedure: RENAL ANGIOGRAM;  Surgeon: Lorretta Harp, MD;  Location[facility] CATH LAB;  Service: Cardiovascular;  Laterality: Bilateral; 6 mm x 12 mm long Herculink balloon expandable stent to the left renal artery  . RENAL ARTERY STENT Left 04/22/2015   dr berry  . SPLENECTOMY  02/2003   Archie Endo 04/21/2011  . TONSILLECTOMY  ~ 7    Social History Social History   Socioeconomic History  . Marital status: Divorced    Spouse name: Not on file  . Number of children: 3  . Years of education: Not on file  . Highest education level: Not on file  Social Needs  . Financial resource strain: Not on file  . Food insecurity - worry: Not on file  . Food insecurity - inability: Not on file  . Transportation needs -  medical: Not on file  . Transportation needs - non-medical: Not on file  Occupational History  . Occupation: Retired  Tobacco Use  . Smoking status: Former Smoker    Years: 30.00    Types: Cigarettes, Cigars    Last attempt to quit: 12/15/1993    Years since quitting: 24.0  . Smokeless tobacco: Never Used  Substance and Sexual Activity  . Alcohol use: Yes    Alcohol/week: 25.2 oz    Types: 28 Shots of liquor, 14 Standard drinks or equivalent per week    Comment: drinks 2 martini's a night (2 shots in each)  . Drug use: No  . Sexual activity: Not on file  Other Topics Concern  . Not on file  Social History Narrative   Tries to remain active.  Frequent golfer but claudication limits this.     No Known Allergies  Family History  Problem  Relation Age of Onset  . Coronary artery disease Mother        bypass surgery - deceased  . Heart disease Father        murmur, valve replacement - deceased  . Breast cancer Sister   . Diabetes Unknown        grandmother  . Diabetes Paternal Grandmother   . Diabetes Paternal Aunt   . Colon cancer Neg Hx       Prior to Admission medications   Medication Sig Start Date End Date Taking? Authorizing Provider  amLODipine (NORVASC) 10 MG tablet Take 10 mg by mouth daily. 02/11/15   [provider]  amoxicillin (AMOXIL) 500 MG capsule Take 2,000 mg by mouth See admin instructions. Will take 30-60 minutes prior to dental appointments 07/29/16   [provider]  atorvastatin (LIPITOR) 40 MG tablet Take 40 mg by mouth daily.     [provider]  canagliflozin (INVOKANA) 100 MG TABS tablet Take 100 mg by mouth daily.    [provider]  enoxaparin (LOVENOX) 80 MG/0.8ML injection Inject 0.8 mLs (80 mg total) into the skin every 12 (twelve) hours. Take as instructed by Coumadin Clinic 09/11/16   Josue Hector, MD  glyBURIDE (DIABETA) 2.5 MG tablet Take 5 mg by mouth daily with breakfast.     [provider]   hydrochlorothiazide (MICROZIDE) 12.5 MG capsule TAKE ONE CAPSULE BY MOUTH EVERY DAY 01/25/17   Lorretta Harp, MD  Melatonin 5 MG TABS Take 1 tablet by mouth at bedtime.    [provider]  metoprolol succinate (TOPROL-XL) 50 MG 24 hr tablet Take 1 tablet (50 mg total) by mouth daily. Take with or immediately following a meal. 02/16/17   Lorretta Harp, MD  sildenafil (REVATIO) 20 MG tablet Take 20 mg by mouth as directed.    [provider]  trandolapril (MAVIK) 4 MG tablet Take 1 tablet (4 mg total) by mouth daily. 08/16/17   Lorretta Harp, MD  warfarin (COUMADIN) 7.5 MG tablet TAKE AS DIRECTED BY COUMADIN CLINIC 08/30/17   Josue Hector, MD    Physical Exam:  Vitals:   12/13/17 1033  BP: (!) 150/60  Pulse: 78  Resp: 16  Temp: 98 F (36.7 C)  TempSrc: Oral  SpO2: 100%  Weight: 74.8 kg (165 lb)  Height: 5\' 8"  (1.727 m)   Constitutional: NAD, calm, comfortable  Eyes: PERRL, lids and conjunctivae normal ENMT: Mucous membranes are moist, without exudate or lesions.  Right facial droop is noted. Neck: normal, supple, no masses, no thyromegaly Respiratory: clear to auscultation bilaterally, no wheezing, no crackles. Normal respiratory effort  Cardiovascular: Regular rate and rhythm,  murmur, rubs or gallops. No extremity edema. 2+ pedal pulses. No carotid bruits.  Abdomen: Soft, non tender, No hepatosplenomegaly. Bowel sounds positive.  Musculoskeletal: no clubbing / cyanosis. Moves all extremities Skin: no jaundice, No lesions.  Neurologic: Sensation intact  Strength equal in all extremities.  Right facial droop, dysarthria noted. Psychiatric:   Alert and oriented x 3. Normal mood.     Labs on Admission: I have personally reviewed following labs and imaging studies  CBC: Recent Labs  Lab 12/13/17 1028 12/13/17 1053  WBC 6.2  --   NEUTROABS 3.8  --   HGB 15.5 16.3  HCT 47.0 48.0  MCV 95.5  --   PLT 221  --     Basic Metabolic Panel: Recent  Labs  Lab 12/13/17 1028 12/13/17 1053  NA  139 141  K 4.4 4.3  CL 102 101  CO2 27  --   GLUCOSE 168* 170*  BUN 11 12  CREATININE 0.98 1.00  CALCIUM 9.1  --     GFR: Estimated Creatinine Clearance: 64.6 mL/min (by C-G formula based on SCr of 1 mg/dL).  Liver Function Tests: Recent Labs  Lab 12/13/17 1028  AST 28  ALT 26  ALKPHOS 56  BILITOT 1.3*  PROT 7.0  ALBUMIN 3.9   No results for input(s): LIPASE, AMYLASE in the last 168 hours. No results for input(s): AMMONIA in the last 168 hours.  Coagulation Profile: Recent Labs  Lab 12/13/17 1028  INR 1.26    Cardiac Enzymes: No results for input(s): CKTOTAL, CKMB, CKMBINDEX, TROPONINI in the last 168 hours.  BNP (last 3 results) No results for input(s): PROBNP in the last 8760 hours.  HbA1C: No results for input(s): HGBA1C in the last 72 hours.  CBG: Recent Labs  Lab 12/13/17 1125  GLUCAP 146*    Lipid Profile: No results for input(s): CHOL, HDL, LDLCALC, TRIG, CHOLHDL, LDLDIRECT in the last 72 hours.  Thyroid Function Tests: No results for input(s): TSH, T4TOTAL, FREET4, T3FREE, THYROIDAB in the last 72 hours.  Anemia Panel: No results for input(s): VITAMINB12, FOLATE, FERRITIN, TIBC, IRON, RETICCTPCT in the last 72 hours.  Urine analysis:    Component Value Date/Time   COLORURINE YELLOW 01/15/2012 1415   APPEARANCEUR CLEAR 01/15/2012 1415   LABSPEC 1.012 01/15/2012 1415   PHURINE 7.0 01/15/2012 1415   GLUCOSEU 100 (A) 01/15/2012 1415   HGBUR NEGATIVE 01/15/2012 1415   BILIRUBINUR NEGATIVE 01/15/2012 1415   KETONESUR NEGATIVE 01/15/2012 1415   PROTEINUR NEGATIVE 01/15/2012 1415   UROBILINOGEN 0.2 01/15/2012 1415   NITRITE NEGATIVE 01/15/2012 1415   LEUKOCYTESUR NEGATIVE 01/15/2012 1415    Sepsis Labs: @LABRCNTIP (procalcitonin:4,lacticidven:4) )No results found for this or any previous visit (from the past 240 hour(s)).   Radiological Exams on Admission: Ct Head Wo Contrast  Result Date:  12/13/2017 CLINICAL DATA:  Right-sided numbness since 12/11/2017. Onset of right facial droop 12/12/2017. EXAM: CT HEAD WITHOUT CONTRAST TECHNIQUE: Contiguous axial images were obtained from the base of the skull through the vertex without intravenous contrast. COMPARISON:  Brain MRI and head CT scan 05/05/2013. FINDINGS: Brain: There is cortical atrophy and chronic microvascular ischemic change. No evidence of acute abnormality including hemorrhage, infarct, mass lesion, mass effect, midline shift or abnormal extra axial fluid collection. No hydrocephalus or pneumocephalus. Vascular: Atherosclerosis noted. Skull: Intact. Sinuses/Orbits: Negative. Other: None. IMPRESSION: No acute abnormality. Atrophy and chronic microvascular ischemic change. Atherosclerosis. Electronically Signed   By: Inge Rise M.D.   On: 12/13/2017 11:12    EKG: Independently reviewed.  Assessment/Plan Active Problems:   Diabetes ([place])   HYPERCHOLESTEROLEMIA   Immune thrombocytopenic purpura (HCC)   Essential hypertension   Coronary atherosclerosis   Aortic valve disorder   Transient cerebral ischemia   PVD (peripheral vascular disease) (HCC)   Aortoiliac occlusive disease (HCC)   Cerebral infarction (HCC)   S/P AVR   Renal artery stenosis, native, bilateral (HCC)   Acute right facial droop, dysarthria, history of stroke/Acute CVA, in the setting of under therapeutic INR.PT 15.7, INR 1.26.  Was 2.2 on 10/25/2017.CT of the head negative for acute intracranial abnormalities.  MRI of the brain is pending NIH stroke scale is 3.  No TPA given due to patient being outside of the window.  Risk factors include age, diabetes, hypertension, hyperlipidemia, peripheral vascular disease, prior CVA,  claudication, aortic valve replacement  admit to Tele / Inpatient Stroke order set  Await for MRI of the brain without contrast results, as well as MR a of the head and neck Allow permissive HTN, hydralazine 5-10 mg every 8 hours as  needed for blood pressure 210/110  Echo  SLP  lipid panel and A1C Continue anticoagulation only after MRI of the brain rules out any hemorrhagic findings Lipitor 80 mg daily Appreciate neurology follow-up.    Type II Diabetes Current blood sugar level is 146 Lab Results  Component Value Date   HGBA1C 6.5 (H) 05/06/2013   Hgb A1C Hold home oral diabetic medications.   SSI   Hypertension BP  150/60 Pulse 78   Hold  home anti-hypertensive medications including Norvasc, Toprol, HCTZ, ACE I   He took his dose prior to coming to the ER Add Hydralazine Q6 hours as needed for BP 160/90   Hyperlipidemia Lipitor 80 mg daily  Check lipid panel   CAD/h/o AVR/PVD/ Renal Artery stenosis, followed by Dr Gwenlyn Found, last seen on Nov 2018  Continue Lipitor, and home meds as above    idiopathic thrombocytopenic purpura, current platelet count 221.  No acute issues.  No bleeding issues. Continue to follow, CBC in a.m.  DVT prophylaxis:  SCD for now, may resume Coumadin after MRI of the  head neg for bleeding  Code Status:  Full  Family Communication:  Discussed with patient Disposition Plan: Expect patient to be discharged to home after condition improves Consults called:    Neuro, Dr. Tobias Alexander  Admission status: Tele Obs    Sharene Butters, PA-C [facility]   Amion text  [phone]   12/13/2017, 12:07 PM

## 2017-12-13 NOTE — ED Notes (Signed)
Pt transferred to [facility] directly from ED room. NIH not completed prior to pt going to MRI, RN was to complete assessments when pt returned from scan; ergo assessment was not completed before pt was transported to [facility].

## 2017-12-13 NOTE — ED Notes (Signed)
Pt CBG 146

## 2017-12-13 NOTE — Progress Notes (Signed)
ANTICOAGULATION CONSULT NOTE - Initial Consult  Pharmacy Consult for Warfarin Indication: Mechanical AVR, Afib, CVA  No Known Allergies  Patient Measurements: Height: 5\' 8"  (172.7 cm) Weight: 165 lb (74.8 kg) IBW/kg (Calculated) : 68.4  Vital Signs: Temp: 98 F (36.7 C) (01/07 1033) Temp Source: Oral (01/07 1033) BP: 187/31 (01/07 1534) Pulse Rate: 78 (01/07 1033)  Labs: Recent Labs    12/13/17 1028 12/13/17 1053  HGB 15.5 16.3  HCT 47.0 48.0  PLT 221  --   APTT 32  --   LABPROT 15.7*  --   INR 1.26  --   CREATININE 0.98 1.00    Estimated Creatinine Clearance: 64.6 mL/min (by C-G formula based on SCr of 1 mg/dL).   Medical History: Past Medical History:  Diagnosis Date  . Adenomatous colon polyp 09/1997  . Anemia   . Aortic stenosis    s/p st. jude mechanical AVR - Chronic Coumadin  . Blood transfusion    "related to ITP"  . Bronchitis 12/17/2011   "just gettin over a case"  . Claudication ([place])   . Coronary artery disease    s/p cabg x 3 11/2003: lima-lad, seq vg to rpda and rpl  . Diverticulitis of colon   . Heart murmur   . Hyperlipidemia   . Hypertension   . ITP (idiopathic thrombocytopenic purpura)   . Peripheral arterial disease ([place])    a. history of aortobifemoral bypass grafting by Dr. Sherren Mocha early b. LE angiography 04/22/2015 patent aortobifem graft, DES to R SFA  . Peripheral vascular disease ([place])    s/p Left external Iliac Artery stenting and subsequent left femoral endarterectomy 02/2011- post op course complicated by wound infxn req I&D 03/2011  . Renal artery stenosis, native, bilateral (HCC)    a. bilateral renal artery stenosis by recent duplex ultrasound b. L renal artery stent 02/2015, R renal artery patent on angiogram  . Stroke ([place])   . TIA (transient ischemic attack) ~ 2013  . Type II diabetes mellitus (HCC)     Medications:  Warfarin PTA 7.5mg  PO daily except 11.25mg  on Sunday - per last Coumadin clinic visit (last dose 1/6 @  1000)  Assessment: 71yom presented to ED with R facial droop. MRI positive for acute L brain infract, no hemorrhages or swelling noted. INR 1.26. CBC WNL. Warfarin to continue.  Goal of Therapy:  INR 2.5-3.5 (St. Jude Mechanical  AVR, Afib, CVA) Monitor platelets by anticoagulation protocol: Yes   Plan:  Warfarin 11.25mg  PO  Daily INR  Ebonie Westerlund 12/13/2017,3:50 PM

## 2017-12-13 NOTE — ED Provider Notes (Signed)
Emergency Department Provider Note   I have reviewed the triage vital signs and the nursing notes.   HISTORY  Chief Complaint Aphasia   HPI [name] [name] is a 72 y.o. male with PMH of AS s/p artifical aortic valve on Coumadin, HLD, HTN, prior TIA, DM, and PAD presents to the emergency department with 2+ days of right face weakness, slurred speech, and decreased sensation.  The patient has constant numbness to the right face with some intermittent numbness in the right arm.  Patient states he has had intermittent numb symptoms in the right upper extremity in the past but since Saturday he is developed the face weakness with slurred speech which is atypical for him.  He had a TIA in the past which presented as right-sided weakness.  Patient states he has been compliant with his Coumadin but has not had his INR checked because of the recent snowstorm followed by holiday office closures.  Patient states he is compliant with his Coumadin and has been adherent to diet restrictions.  No symptoms in the right lower extremity.  Denies any vision changes or difficulty swallowing.  Reports that he is due to have carotid Dopplers done later this week ordered by his PCP.    Past Medical History:  Diagnosis Date  . Adenomatous colon polyp 09/1997  . Anemia   . Aortic stenosis    s/p st. jude mechanical AVR - Chronic Coumadin  . Blood transfusion    "related to ITP"  . Bronchitis 12/17/2011   "just gettin over a case"  . Claudication ([place])   . Coronary artery disease    s/p cabg x 3 11/2003: lima-lad, seq vg to rpda and rpl  . Diverticulitis of colon   . Heart murmur   . Hyperlipidemia   . Hypertension   . ITP (idiopathic thrombocytopenic purpura)   . Peripheral arterial disease ([place])    a. history of aortobifemoral bypass grafting by Dr. Sherren Mocha early b. LE angiography 04/22/2015 patent aortobifem graft, DES to R SFA  . Peripheral vascular disease ([place])    s/p Left external Iliac  Artery stenting and subsequent left femoral endarterectomy 02/2011- post op course complicated by wound infxn req I&D 03/2011  . Renal artery stenosis, native, bilateral (HCC)    a. bilateral renal artery stenosis by recent duplex ultrasound b. L renal artery stent 02/2015, R renal artery patent on angiogram  . Stroke ([place])   . TIA (transient ischemic attack) ~ 2013  . Type II diabetes mellitus [facility])     Patient Active Problem List   Diagnosis Date Noted  . Claudication ([employer]) 10/01/2016  . Renal artery arteriosclerosis ([place]) 02/22/2015  . Renal artery stenosis ([place]) 02/21/2015  . Renal artery stenosis, native, bilateral ([place]) 02/08/2015  . S/P AVR 07/20/2014  . Encounter for therapeutic drug monitoring 03/13/2014  . Cerebral infarction ([place]) 05/05/2013  . Aortoiliac occlusive disease ([place]) 01/10/2013  . PVD (peripheral vascular disease) with claudication ([place]) 02/02/2012  . Atherosclerosis of native arteries of extremity with intermittent claudication ([place]) 01/12/2012  . Coronary artery disease   . Aortic stenosis   . Peripheral vascular disease ([place])   . Hypertension   . Hyperlipidemia   . ITP (idiopathic thrombocytopenic purpura)   . Coronary atherosclerosis 10/21/2010  . Transient cerebral ischemia 10/10/2009  . Diabetes ([place]) 10/22/2008  . HYPERCHOLESTEROLEMIA 10/22/2008  . Immune thrombocytopenic purpura ([place]) 10/22/2008  . Essential hypertension 10/22/2008  . Aortic valve disorder 10/22/2008  . PVD (peripheral vascular disease) ([place])  10/22/2008  . DIVERTICULITIS, COLON 10/22/2008  . COLONIC POLYPS, HX OF 10/22/2008    Past Surgical History:  Procedure Laterality Date  . ABDOMINAL AORTAGRAM N/A 12/16/2011   Procedure: ABDOMINAL Maxcine Ham;  Surgeon: Sherren Mocha, MD;  Location: [facility] CATH LAB;  Service: Cardiovascular;  Laterality: N/A;  . ANGIOPLASTY / STENTING ILIAC     Left external Iliac Artery  . AORTA - BILATERAL FEMORAL ARTERY BYPASS GRAFT  01/18/2012   Procedure:  AORTA BIFEMORAL BYPASS GRAFT;  Surgeon: Curt Jews, MD;  Location: [place];  Service: Vascular;  Laterality: N/A;  . AORTIC VALVE REPLACEMENT  ~ 2004  . CARDIAC CATHETERIZATION  11/2003   /pt report 10/01/2016  . CARDIAC VALVE REPLACEMENT     aortic  . CATARACT EXTRACTION W/ INTRAOCULAR LENS  IMPLANT, BILATERAL Bilateral   . CORONARY ARTERY BYPASS GRAFT  11/2003   Archie Endo 04/21/2011  . LOWER EXTREMITY ANGIOGRAM N/A 02/21/2015   Procedure: LOWER EXTREMITY ANGIOGRAM;  Surgeon: Lorretta Harp, MD;  Location[facility] CATH LAB;  Service: Cardiovascular;  Laterality: N/A;  . PERIPHERAL VASCULAR CATHETERIZATION N/A 04/22/2015   Procedure: Lower Extremity Angiography;  Surgeon: Lorretta Harp, MD;  Location: [place] CV LAB;  Service: Cardiovascular;  Laterality: N/A;  . PERIPHERAL VASCULAR CATHETERIZATION N/A 08/24/2016   Procedure: Lower Extremity Angiography;  Surgeon: Lorretta Harp, MD;  Location: [place] CV LAB;  Service: Cardiovascular;  Laterality: N/A;  . PERIPHERAL VASCULAR CATHETERIZATION Right 10/01/2016   Procedure: Peripheral Vascular Intervention - STENT;  Surgeon: Lorretta Harp, MD;  Location: [place] CV LAB;  Service: Cardiovascular;  Laterality: Right;  Prox and MID SFA   . RENAL ANGIOGRAM N/A 02/21/2015   Procedure: RENAL ANGIOGRAM;  Surgeon: Lorretta Harp, MD;  Location: [facility] CATH LAB;  Service: Cardiovascular;  Laterality: Bilateral; 6 mm x 12 mm [place] Herculink balloon expandable stent to the left renal artery  . RENAL ARTERY STENT Left 04/22/2015   dr berry  . SPLENECTOMY  02/2003   Archie Endo 04/21/2011  . TONSILLECTOMY  ~ 1952    Current Outpatient Rx  . Order #: [id number] Class: Historical Med  . Order #: [id number] Class: Historical Med  . Order #: [id number] Class: Historical Med  . Order #: [id number] Class: Historical Med  . Order #: [id number] Class: Normal  . Order #: [id number] Class: Historical Med  . Order #: [id number] Class: Normal  . Order #: [id number] Class:  Historical Med  . Order #: [id number] Class: Normal  . Order #: [id number] Class: Normal  . Order #: [id number] Class: Historical Med  . Order #: [id number] Class: Normal    Allergies Patient has no known allergies.  Family History  Problem Relation Age of Onset  . Coronary artery disease Mother        bypass surgery - deceased  . Heart disease Father        murmur, valve replacement - deceased  . Breast cancer Sister   . Diabetes Unknown        grandmother  . Diabetes Paternal Grandmother   . Diabetes Paternal Aunt   . Colon cancer Neg Hx     Social History Social History   Tobacco Use  . Smoking status: Former Smoker    Years: 30.00    Types: Cigarettes, Cigars    Last attempt to quit: 12/15/1993    Years since quitting: 24.0  . Smokeless tobacco: Never Used  Substance Use Topics  . Alcohol use: Yes    Alcohol/week: 25.2 oz    Types: 28 Shots of liquor, 14 Standard  drinks or equivalent per week    Comment: drinks 2 martini's a night (2 shots in each)  . Drug use: No    Review of Systems  Constitutional: No fever/chills Eyes: No visual changes. ENT: No sore throat. Cardiovascular: Denies chest pain. Respiratory: Denies shortness of breath. Gastrointestinal: No abdominal pain.  No nausea, no vomiting.  No diarrhea.  No constipation. Genitourinary: Negative for dysuria. Musculoskeletal: Negative for back pain. Skin: Negative for rash. Neurological: Negative for headaches. Positive right face weakness/numbness with slurred speech and numbness (intermittent) with the RUE.   10-point ROS otherwise negative.  ____________________________________________   PHYSICAL EXAM:  VITAL SIGNS: ED Triage Vitals [12/13/17 1033]  Enc Vitals Group     BP (!) 150/60     Pulse Rate 78     Resp 16     Temp 98 F (36.7 C)     Temp Source Oral     SpO2 100 %     Weight 165 lb (74.8 kg)     Height 5\' 8"  (1.727 m)     Pain Score 3   Constitutional: Alert and oriented. Well  appearing and in no acute distress. Eyes: Conjunctivae are normal. PERRL. EOMI. Head: Atraumatic. Nose: No congestion/rhinnorhea. Mouth/Throat: Mucous membranes are moist.  Neck: No stridor. Cardiovascular: Normal rate, regular rhythm. Good peripheral circulation. Grossly normal heart sounds.   Respiratory: Normal respiratory effort.  No retractions. Lungs CTAB. Gastrointestinal: Soft and nontender. No distention.  Musculoskeletal: No lower extremity tenderness nor edema. No gross deformities of extremities. Neurologic:  Very slight aphasia with right face droop. Decreased sensation to light touch over the right face and RUE. No pronator drift.  Skin:  Skin is warm, dry and intact. No rash noted.  ____________________________________________   LABS (all labs ordered are listed, but only abnormal results are displayed)  Labs Reviewed  PROTIME-INR - Abnormal; Notable for the following components:      Result Value   Prothrombin Time 15.7 (*)    All other components within normal limits  COMPREHENSIVE METABOLIC PANEL - Abnormal; Notable for the following components:   Glucose, Bld 168 (*)    Total Bilirubin 1.3 (*)    All other components within normal limits  CBG MONITORING, ED - Abnormal; Notable for the following components:   Glucose-Capillary 146 (*)    All other components within normal limits  I-STAT CHEM 8, ED - Abnormal; Notable for the following components:   Glucose, Bld 170 (*)    Calcium, Ion 1.12 (*)    All other components within normal limits  APTT  CBC  DIFFERENTIAL  I-STAT TROPONIN, ED   ____________________________________________  EKG   EKG Interpretation  Date/Time:  Monday December 13 2017 10:34:24 EST Ventricular Rate:  71 PR Interval:  186 QRS Duration: 96 QT Interval:  400 QTC Calculation: 434 R Axis:   91 Text Interpretation:  Normal sinus rhythm Rightward axis Nonspecific T wave abnormality Abnormal ECG No STEMI.  Confirmed by Nanda Quinton  [phone]) on 12/13/2017 11:48:54 AM       ____________________________________________  RADIOLOGY  Ct Head Wo Contrast  Result Date: 12/13/2017 CLINICAL DATA:  Right-sided numbness since 12/11/2017. Onset of right facial droop 12/12/2017. EXAM: CT HEAD WITHOUT CONTRAST TECHNIQUE: Contiguous axial images were obtained from the base of the skull through the vertex without intravenous contrast. COMPARISON:  Brain MRI and head CT scan 05/05/2013. FINDINGS: Brain: There is cortical atrophy and chronic microvascular ischemic change. No evidence of acute abnormality including hemorrhage, infarct,  mass lesion, mass effect, midline shift or abnormal extra axial fluid collection. No hydrocephalus or pneumocephalus. Vascular: Atherosclerosis noted. Skull: Intact. Sinuses/Orbits: Negative. Other: None. IMPRESSION: No acute abnormality. Atrophy and chronic microvascular ischemic change. Atherosclerosis. Electronically Signed   By: Inge Rise M.D.   On: 12/13/2017 11:12    ____________________________________________   PROCEDURES  Procedure(s) performed:   Procedures  None ____________________________________________   INITIAL IMPRESSION / ASSESSMENT AND PLAN / ED COURSE  Pertinent labs & imaging results that were available during my care of the patient were reviewed by me and considered in my medical decision making (see chart for details).  Patient presents to the emergency department for evaluation of strokelike symptoms starting 2 days ago.  His INR is low and aortic valve.  Suspect embolic event from that source versus possible carotid stenosis.  Patient scheduled to have outpatient carotid Dopplers.  No headache to suggest complex migraine.  Symptoms not consistent with Bell's palsy.  Plan to discuss the case with neurology and obtain MRI with plan to admit for further embolic workup.  Will start heparin as the patient is subtherapeutic with INR. No bleed on CT head.   11:53 AM Spoke with  Dr. Leonel Ramsay with Neurology. Plan for MRI, admit, and hold anticoagulation for now until MRI. Would prefer Heparin if re-starting after MRI. Will discuss with admit team.   Discussed patient's case with Hospitalist, Sherrilyn Rist to request admission. Patient and family (if present) updated with plan. Care transferred to Hospitalist service.  I reviewed all nursing notes, vitals, pertinent old records, EKGs, labs, imaging (as available).  ____________________________________________  FINAL CLINICAL IMPRESSION(S) / ED DIAGNOSES  Final diagnoses:  Stroke-like symptom     MEDICATIONS GIVEN DURING THIS VISIT:  Medications  LORazepam (ATIVAN) injection 0.5 mg (0.5 mg Intravenous Given 12/13/17 1209)     Note:  This document was prepared using Dragon voice recognition software and may include unintentional dictation errors.  Nanda Quinton, MD Emergency Medicine    Hagan Maltz, [facility], MD 12/13/17 [phone]

## 2017-12-13 NOTE — ED Triage Notes (Signed)
Pt arrives to the ER with slurred speech and lefty sided facial droop. Pt reports that starting on Saturday he realized that his speech was slurred and his right side of his face felt numb. Pt states this feeling has came and went. Pt states this morning when he woke up his speech was still not normal but throughout the morning he felt his speech was getting worse. Pt arrives to ED with obvious right sided facial droop and slurred speech.  Pt states he has a artifical heart valvule and takes coumadin. Pt was no weakness or drift, reports no problems with ambulating.

## 2017-12-14 ENCOUNTER — Other Ambulatory Visit: Payer: Self-pay

## 2017-12-14 ENCOUNTER — Observation Stay (HOSPITAL_BASED_OUTPATIENT_CLINIC_OR_DEPARTMENT_OTHER): Payer: Medicare Other

## 2017-12-14 ENCOUNTER — Encounter (HOSPITAL_COMMUNITY): Payer: Self-pay | Admitting: *Deleted

## 2017-12-14 DIAGNOSIS — Z803 Family history of malignant neoplasm of breast: Secondary | ICD-10-CM | POA: Diagnosis not present

## 2017-12-14 DIAGNOSIS — I63412 Cerebral infarction due to embolism of left middle cerebral artery: Secondary | ICD-10-CM | POA: Diagnosis present

## 2017-12-14 DIAGNOSIS — E876 Hypokalemia: Secondary | ICD-10-CM | POA: Diagnosis present

## 2017-12-14 DIAGNOSIS — I701 Atherosclerosis of renal artery: Secondary | ICD-10-CM | POA: Diagnosis present

## 2017-12-14 DIAGNOSIS — I350 Nonrheumatic aortic (valve) stenosis: Secondary | ICD-10-CM | POA: Diagnosis not present

## 2017-12-14 DIAGNOSIS — I63312 Cerebral infarction due to thrombosis of left middle cerebral artery: Secondary | ICD-10-CM

## 2017-12-14 DIAGNOSIS — R2990 Unspecified symptoms and signs involving the nervous system: Secondary | ICD-10-CM | POA: Diagnosis not present

## 2017-12-14 DIAGNOSIS — E1149 Type 2 diabetes mellitus with other diabetic neurological complication: Secondary | ICD-10-CM | POA: Diagnosis not present

## 2017-12-14 DIAGNOSIS — I10 Essential (primary) hypertension: Secondary | ICD-10-CM | POA: Diagnosis not present

## 2017-12-14 DIAGNOSIS — Z951 Presence of aortocoronary bypass graft: Secondary | ICD-10-CM | POA: Diagnosis not present

## 2017-12-14 DIAGNOSIS — Z87891 Personal history of nicotine dependence: Secondary | ICD-10-CM | POA: Diagnosis not present

## 2017-12-14 DIAGNOSIS — E785 Hyperlipidemia, unspecified: Secondary | ICD-10-CM | POA: Diagnosis present

## 2017-12-14 DIAGNOSIS — Z7901 Long term (current) use of anticoagulants: Secondary | ICD-10-CM | POA: Diagnosis not present

## 2017-12-14 DIAGNOSIS — I6300 Cerebral infarction due to thrombosis of unspecified precerebral artery: Secondary | ICD-10-CM | POA: Diagnosis not present

## 2017-12-14 DIAGNOSIS — R29703 NIHSS score 3: Secondary | ICD-10-CM | POA: Diagnosis present

## 2017-12-14 DIAGNOSIS — Z8249 Family history of ischemic heart disease and other diseases of the circulatory system: Secondary | ICD-10-CM | POA: Diagnosis not present

## 2017-12-14 DIAGNOSIS — R471 Dysarthria and anarthria: Secondary | ICD-10-CM | POA: Diagnosis present

## 2017-12-14 DIAGNOSIS — E1151 Type 2 diabetes mellitus with diabetic peripheral angiopathy without gangrene: Secondary | ICD-10-CM | POA: Diagnosis present

## 2017-12-14 DIAGNOSIS — Z952 Presence of prosthetic heart valve: Secondary | ICD-10-CM | POA: Diagnosis not present

## 2017-12-14 DIAGNOSIS — R4701 Aphasia: Secondary | ICD-10-CM | POA: Diagnosis present

## 2017-12-14 DIAGNOSIS — Z9081 Acquired absence of spleen: Secondary | ICD-10-CM | POA: Diagnosis not present

## 2017-12-14 DIAGNOSIS — R29810 Facial weakness: Secondary | ICD-10-CM | POA: Diagnosis present

## 2017-12-14 DIAGNOSIS — E7800 Pure hypercholesterolemia, unspecified: Secondary | ICD-10-CM | POA: Diagnosis present

## 2017-12-14 DIAGNOSIS — D693 Immune thrombocytopenic purpura: Secondary | ICD-10-CM | POA: Diagnosis present

## 2017-12-14 DIAGNOSIS — I2510 Atherosclerotic heart disease of native coronary artery without angina pectoris: Secondary | ICD-10-CM | POA: Diagnosis present

## 2017-12-14 DIAGNOSIS — R791 Abnormal coagulation profile: Secondary | ICD-10-CM | POA: Diagnosis present

## 2017-12-14 DIAGNOSIS — Z8673 Personal history of transient ischemic attack (TIA), and cerebral infarction without residual deficits: Secondary | ICD-10-CM | POA: Diagnosis not present

## 2017-12-14 DIAGNOSIS — Z833 Family history of diabetes mellitus: Secondary | ICD-10-CM | POA: Diagnosis not present

## 2017-12-14 DIAGNOSIS — I7409 Other arterial embolism and thrombosis of abdominal aorta: Secondary | ICD-10-CM | POA: Diagnosis present

## 2017-12-14 LAB — CBC
HEMATOCRIT: 45.4 % (ref 39.0–52.0)
HEMOGLOBIN: 14.6 g/dL (ref 13.0–17.0)
MCH: 31.1 pg (ref 26.0–34.0)
MCHC: 32.2 g/dL (ref 30.0–36.0)
MCV: 96.6 fL (ref 78.0–100.0)
Platelets: 223 10*3/uL (ref 150–400)
RBC: 4.7 MIL/uL (ref 4.22–5.81)
RDW: 14.2 % (ref 11.5–15.5)
WBC: 5.7 10*3/uL (ref 4.0–10.5)

## 2017-12-14 LAB — RAPID URINE DRUG SCREEN, HOSP PERFORMED
Amphetamines: NOT DETECTED
BENZODIAZEPINES: NOT DETECTED
Barbiturates: NOT DETECTED
COCAINE: NOT DETECTED
OPIATES: NOT DETECTED
Tetrahydrocannabinol: NOT DETECTED

## 2017-12-14 LAB — ECHOCARDIOGRAM COMPLETE
HEIGHTINCHES: 68 in
Weight: 2640 oz

## 2017-12-14 LAB — COMPREHENSIVE METABOLIC PANEL
ALBUMIN: 3.4 g/dL — AB (ref 3.5–5.0)
ALT: 22 U/L (ref 17–63)
ANION GAP: 8 (ref 5–15)
AST: 22 U/L (ref 15–41)
Alkaline Phosphatase: 49 U/L (ref 38–126)
BILIRUBIN TOTAL: 1 mg/dL (ref 0.3–1.2)
BUN: 13 mg/dL (ref 6–20)
CO2: 27 mmol/L (ref 22–32)
Calcium: 8.7 mg/dL — ABNORMAL LOW (ref 8.9–10.3)
Chloride: 104 mmol/L (ref 101–111)
Creatinine, Ser: 0.8 mg/dL (ref 0.61–1.24)
GLUCOSE: 118 mg/dL — AB (ref 65–99)
POTASSIUM: 3.7 mmol/L (ref 3.5–5.1)
Sodium: 139 mmol/L (ref 135–145)
TOTAL PROTEIN: 6 g/dL — AB (ref 6.5–8.1)

## 2017-12-14 LAB — VAS US CAROTID
LCCAPDIAS: 12 cm/s
LEFT ECA DIAS: -15 cm/s
LEFT VERTEBRAL DIAS: 5 cm/s
LICAPDIAS: -16 cm/s
Left CCA dist dias: -14 cm/s
Left CCA dist sys: -86 cm/s
Left CCA prox sys: 101 cm/s
Left ICA dist dias: -18 cm/s
Left ICA dist sys: -93 cm/s
Left ICA prox sys: -85 cm/s
RCCAPDIAS: 12 cm/s
RIGHT ECA DIAS: -30 cm/s
RIGHT VERTEBRAL DIAS: -17 cm/s
Right CCA prox sys: 110 cm/s
Right cca dist sys: -114 cm/s

## 2017-12-14 LAB — GLUCOSE, CAPILLARY
Glucose-Capillary: 242 mg/dL — ABNORMAL HIGH (ref 65–99)
Glucose-Capillary: 146 mg/dL — ABNORMAL HIGH (ref 65–99)
Glucose-Capillary: 135 mg/dL — ABNORMAL HIGH (ref 65–99)

## 2017-12-14 LAB — HEPARIN LEVEL (UNFRACTIONATED)
Heparin Unfractionated: <0.1 IU/mL — ABNORMAL LOW (ref 0.30–0.70)
Heparin Unfractionated: 0.13 IU/mL — ABNORMAL LOW (ref 0.30–0.70)

## 2017-12-14 LAB — PROTIME-INR
INR: 1.11
Prothrombin Time: 14.2 seconds (ref 11.4–15.2)

## 2017-12-14 MED ORDER — OMEGA-3-ACID ETHYL ESTERS 1 G PO CAPS
1.0000 g | ORAL_CAPSULE | Freq: Two times a day (BID) | ORAL | Status: DC
  Administered 2017-12-14 – 2017-12-18 (×8): 1 g via ORAL
  Filled 2017-12-14 (×8): qty 1

## 2017-12-14 MED ORDER — WARFARIN SODIUM 7.5 MG PO TABS
12.5000 mg | ORAL_TABLET | Freq: Once | ORAL | Status: AC
  Administered 2017-12-14: 12.5 mg via ORAL
  Filled 2017-12-14: qty 1

## 2017-12-14 MED ORDER — STROKE: EARLY STAGES OF RECOVERY BOOK
Freq: Once | Status: AC
  Administered 2017-12-14: 09:00:00

## 2017-12-14 NOTE — Progress Notes (Signed)
ANTICOAGULATION CONSULT NOTE - Follow Up Consult  Pharmacy Consult for Heparin>warfarin  Indication: atrial fibrillation, stroke and mechanical AVR  No Known Allergies  Patient Measurements: Height: 5\' 8"  (172.7 cm) Weight: 165 lb (74.8 kg) IBW/kg (Calculated) : 68.4  Vital Signs: Temp: 98.1 F (36.7 C) (01/08 1012) Temp Source: Oral (01/08 1012) BP: 112/44 (01/08 1012) Pulse Rate: 65 (01/08 1012)  Labs: Recent Labs    12/13/17 1028 12/13/17 1053 12/14/17 0440 12/14/17 1403  HGB 15.5 16.3 14.6  --   HCT 47.0 48.0 45.4  --   PLT 221  --  223  --   APTT 32  --   --   --   LABPROT 15.7*  --  14.2  --   INR 1.26  --  1.11  --   HEPARINUNFRC  --   --  <0.10* 0.13*  CREATININE 0.98 1.00 0.80  --     Estimated Creatinine Clearance: 80.8 mL/min (by C-G formula based on SCr of 0.8 mg/dL).  Assessment: On warfarin PTA for afib/mech AVR, here with stroke, INR is sub-therapeutic, on heparin drip, heparin level still low after rate adjustment this morning, no issues per RN.   Patient had questions about warfarin dosing, I answered questions and discussed our dosing plan to get him up to a therapeutic INR  Goal of Therapy:  INR goal 2.5-3.5 (closer to ~2.5) Heparin level 0.3-0.5 units/ml Monitor platelets by anticoagulation protocol: Yes   Plan:  No bolus Inc heparin to 1200 units/hr Heparin level at midnight 12.5mg  of coumadin tonight  Erin Hearing PharmD., BCPS Clinical Pharmacist Pager [phone] 12/14/2017 3:10 PM

## 2017-12-14 NOTE — Progress Notes (Signed)
PT Cancellation Note  Patient Details Name: [name] [name] MRN: [id number] DOB: 10/25/1946   Cancelled Treatment:    Reason Eval/Treat Not Completed: (P) PT screened, no needs identified, will sign off Pt walking unit with RN without assist. Spoke with pt and he did express any concern with his functional mobility. If pt experiences a change in functional mobility reorder PT evaluation. Thanks.  Kanaan Kagawa B. Migdalia Dk PT, DPT Acute Rehabilitation  [phone] Pager [phone]    [place] 12/14/2017, 11:54 AM

## 2017-12-14 NOTE — Progress Notes (Signed)
*  PRELIMINARY RESULTS* Vascular Ultrasound Carotid Duplex (Doppler) has been completed.  Preliminary findings:   Right Carotid: There is evidence in the right ICA of a 1-39% stenosis. No visualized hemodynamically significant stenosis. Very difficult exam due to significant amount of calcification, poor windows and tortuous vessel.    Left Carotid: There is evidence in the left ICA of a 1-39% stenosis. No visualized hemodynamically significant stenosis. Very difficult exam due to significant amount of calcification, poor windows and tortuous vessel.    Vertebrals: Both vertebral arteries were patent with antegrade flow.    Everrett Coombe 12/14/2017, 4:10 PM

## 2017-12-14 NOTE — Progress Notes (Signed)
ANTICOAGULATION CONSULT NOTE - Follow Up Consult  Pharmacy Consult for Heparin  Indication: atrial fibrillation, stroke and mechanical AVR  No Known Allergies  Patient Measurements: Height: 5\' 8"  (172.7 cm) Weight: 165 lb (74.8 kg) IBW/kg (Calculated) : 68.4  Vital Signs: Temp: 97.8 F (36.6 C) (01/08 0051) Temp Source: Oral (01/08 0051) BP: 145/44 (01/08 0051) Pulse Rate: 57 (01/08 0051)  Labs: Recent Labs    12/13/17 1028 12/13/17 1053 12/14/17 0440  HGB 15.5 16.3 14.6  HCT 47.0 48.0 45.4  PLT 221  --  223  APTT 32  --   --   LABPROT 15.7*  --  14.2  INR 1.26  --  1.11  HEPARINUNFRC  --   --  <0.10*  CREATININE 0.98 1.00 0.80    Estimated Creatinine Clearance: 80.8 mL/min (by C-G formula based on SCr of 0.8 mg/dL).  Assessment: On warfarin PTA for afib/mech AVR, here with stroke, INR is sub-therapeutic, on heparin drip, heparin level undetectable, no issues per RN.   Goal of Therapy:  Heparin level 0.3-0.5 units/ml Monitor platelets by anticoagulation protocol: Yes   Plan:  No bolus Inc heparin to 950 units/hr 1400 HL  [name] [name] 12/14/2017,5:58 AM

## 2017-12-14 NOTE — Evaluation (Signed)
Clinical/Bedside Swallow Evaluation Patient Details  Name: Baraka Klatt MRN: [id number] Date of Birth: 07/05/1946  Today's Date: 12/14/2017 Time: SLP Start Time (ACUTE ONLY): 0820 SLP Stop Time (ACUTE ONLY): 0830 SLP Time Calculation (min) (ACUTE ONLY): 10 min  Past Medical History:  Past Medical History:  Diagnosis Date  . Adenomatous colon polyp 09/1997  . Anemia   . Aortic stenosis    s/p st. jude mechanical AVR - Chronic Coumadin  . Blood transfusion    "related to ITP"  . Coronary artery disease    s/p cabg x 3 11/2003: lima-lad, seq vg to rpda and rpl  . Diverticulitis of colon   . Heart murmur   . Hyperlipidemia   . Hypertension   . ITP (idiopathic thrombocytopenic purpura)   . Peripheral arterial disease ([place])    a. history of aortobifemoral bypass grafting by Dr. Sherren Mocha early b. LE angiography 04/22/2015 patent aortobifem graft, DES to R SFA  . Peripheral vascular disease ([place])    s/p Left external Iliac Artery stenting and subsequent left femoral endarterectomy 02/2011- post op course complicated by wound infxn req I&D 03/2011  . Renal artery stenosis, native, bilateral (HCC)    a. bilateral renal artery stenosis by recent duplex ultrasound b. L renal artery stent 02/2015, R renal artery patent on angiogram  . Stroke ([place])   . TIA (transient ischemic attack) ~ 2013  . Type II diabetes mellitus ([place])    Past Surgical History:  Past Surgical History:  Procedure Laterality Date  . ABDOMINAL AORTAGRAM N/A 12/16/2011   Procedure: ABDOMINAL Maxcine Ham;  Surgeon: Sherren Mocha, MD;  Location: [facility] CATH LAB;  Service: Cardiovascular;  Laterality: N/A;  . ANGIOPLASTY / STENTING ILIAC     Left external Iliac Artery  . AORTA - BILATERAL FEMORAL ARTERY BYPASS GRAFT  01/18/2012   Procedure: AORTA BIFEMORAL BYPASS GRAFT;  Surgeon: Curt Jews, MD;  Location: [place];  Service: Vascular;  Laterality: N/A;  . AORTIC VALVE REPLACEMENT  ~ 2004  . CARDIAC CATHETERIZATION  11/2003   /pt  report 10/01/2016  . CARDIAC VALVE REPLACEMENT  11/2003   aortic  . CATARACT EXTRACTION W/ INTRAOCULAR LENS  IMPLANT, BILATERAL Bilateral   . CORONARY ARTERY BYPASS GRAFT  11/2003   Archie Endo 04/21/2011  . LOWER EXTREMITY ANGIOGRAM N/A 02/21/2015   Procedure: LOWER EXTREMITY ANGIOGRAM;  Surgeon: Lorretta Harp, MD;  Location: [facility] CATH LAB;  Service: Cardiovascular;  Laterality: N/A;  . PERIPHERAL VASCULAR CATHETERIZATION N/A 04/22/2015   Procedure: Lower Extremity Angiography;  Surgeon: Lorretta Harp, MD;  Location: [place] CV LAB;  Service: Cardiovascular;  Laterality: N/A;  . PERIPHERAL VASCULAR CATHETERIZATION N/A 08/24/2016   Procedure: Lower Extremity Angiography;  Surgeon: Lorretta Harp, MD;  Location: [place] CV LAB;  Service: Cardiovascular;  Laterality: N/A;  . PERIPHERAL VASCULAR CATHETERIZATION Right 10/01/2016   Procedure: Peripheral Vascular Intervention - STENT;  Surgeon: Lorretta Harp, MD;  Location: [place] CV LAB;  Service: Cardiovascular;  Laterality: Right;  Prox and MID SFA   . RENAL ANGIOGRAM N/A 02/21/2015   Procedure: RENAL ANGIOGRAM;  Surgeon: Lorretta Harp, MD;  Location: [facility] CATH LAB;  Service: Cardiovascular;  Laterality: Bilateral; 6 mm x 12 mm long Herculink balloon expandable stent to the left renal artery  . RENAL ARTERY STENT Left 04/22/2015   dr berry  . SPLENECTOMY  02/2003   Archie Endo 04/21/2011  . TONSILLECTOMY  ~ 1952   HPI:  72 y.o. male presenting to the emergency department  with a small left cortical stroke in the setting of subtherapeutic INR.     Assessment / Plan / Recommendation Clinical Impression   Pt presents with grossly intact swallowing function.  Pt had adequate containment, manipulation, and clearance of boluses from the oral cavity and no overt s/s of aspiration with solids or liquids.  Recommend that pt remain on his current diet of regular textures and thin liquids.  No further ST needs are indicated at this time.         Aspiration Risk  No limitations    Diet Recommendation Regular;Thin liquid   Liquid Administration via: Cup;Straw Medication Administration: Whole meds with liquid Supervision: Patient able to self feed Compensations: Slow rate;Small sips/bites Postural Changes: Seated upright at 90 degrees    Other  Recommendations Oral Care Recommendations: Oral care BID   Follow up Recommendations None        Swallow Study   General Date of Onset: 12/14/17 HPI: 72 y.o. male presenting to the emergency department with a small left cortical stroke in the setting of subtherapeutic INR.   Type of Study: Bedside Swallow Evaluation Previous Swallow Assessment: none on record Diet Prior to this Study: Regular;Thin liquids Temperature Spikes Noted: No Respiratory Status: Room air History of Recent Intubation: No Behavior/Cognition: Alert;Cooperative;Pleasant mood Oral Cavity Assessment: Within Functional Limits Oral Care Completed by SLP: No Oral Cavity - Dentition: Adequate natural dentition Vision: Functional for self-feeding Self-Feeding Abilities: Able to feed self Patient Positioning: Upright in bed Baseline Vocal Quality: Normal Volitional Cough: Strong Volitional Swallow: Able to elicit    Oral/Motor/Sensory Function Overall Oral Motor/Sensory Function: Mild impairment Facial ROM: Reduced right Facial Symmetry: Within Functional Limits Facial Strength: Within Functional Limits Facial Sensation: Within Functional Limits Lingual ROM: Within Functional Limits Lingual Symmetry: Abnormal symmetry right Lingual Strength: Within Functional Limits   Ice Chips     Thin Liquid Thin Liquid: Within functional limits    Nectar Thick     Honey Thick     Puree     Solid   GO   Solid: Within functional limits    Functional Assessment Tool Used: bedside swallow evaluation  Functional Limitations: Swallowing Swallow Current Status (Y5638): 0 percent impaired, limited or  restricted Swallow Goal Status (L3734): 0 percent impaired, limited or restricted Swallow Discharge Status (K8768): 0 percent impaired, limited or restricted   Windell Moulding L 12/14/2017,8:47 AM

## 2017-12-14 NOTE — Consult Note (Signed)
NEUROHOSPITALISTS STROKE TEAM - DAILY PROGRESS NOTE   ADMISSION HISTORY: [name] [name] is an 72 y.o. male with history of type 2 diabetes, stroke, PVD, hypertension, hyperlipidemia, ITP, claudication, aortic stenosis, aortic valve replacement.  Patient currently is on Coumadin with an INR of 1.29.  Patient states that over prolonged period of time he had had intermittent right facial numbness and right arm numbness that would last for approximately 10 to up to 40 minutes but then resolved.  He never seek attention as he did not feel the knees were that important and they would resolve.  This past Saturday he noted that he had a right facial droop, dysarthria which was worse than normal, and right arm numbness that was intermittent.  Unfortunately, these symptoms did not resolve as they usually do thus patient came to the ED.  Currently he still has a right facial droop, moderate dysarthria which she says actually is waxing and waning while he has been in the emergency department 5/10 but no arm symptoms.  Date last known well: Date: 12/11/2017 Time last known well: Unable to determine tPA Given: No: Out of the window NIH stroke scale 3 Modified Rankin: Rankin Score=0  SUBJECTIVE (INTERVAL HISTORY) No family  is at the bedside. Patient is found laying in bed in NAD. Overall he feels his condition is unchanged. Voices no new complaints. No new events reported overnight.   OBJECTIVE Lab Results: CBC:  Recent Labs  Lab 12/13/17 1028 12/13/17 1053 12/14/17 0440  WBC 6.2  --  5.7  HGB 15.5 16.3 14.6  HCT 47.0 48.0 45.4  MCV 95.5  --  96.6  PLT 221  --  223   BMP: Recent Labs  Lab 12/13/17 1028 12/13/17 1053 12/14/17 0440  NA 139 141 139  K 4.4 4.3 3.7  CL 102 101 104  CO2 27  --  27  GLUCOSE 168* 170* 118*  BUN 11 12 13   CREATININE 0.98 1.00 0.80  CALCIUM 9.1  --  8.7*   Liver Function Tests:  Recent Labs  Lab  12/13/17 1028 12/14/17 0440  AST 28 22  ALT 26 22  ALKPHOS 56 49  BILITOT 1.3* 1.0  PROT 7.0 6.0*  ALBUMIN 3.9 3.4*   Thyroid Function Studies:  Recent Labs    12/13/17 1728  TSH 2.178   Coagulation Studies:  Recent Labs    12/13/17 1028 12/14/17 0440  APTT 32  --   INR 1.26 1.11   PHYSICAL EXAM Temp:  [97.8 F (36.6 C)-98.6 F (37 C)] 98.1 F (36.7 C) (01/08 1012) Pulse Rate:  [57-70] 65 (01/08 1012) Resp:  [16-20] 16 (01/08 1012) BP: (112-199)/(31-53) 112/44 (01/08 1012) SpO2:  [94 %-98 %] 98 % (01/08 1012) General - Well nourished, well developed, in no apparent distress HEENT-  Normocephalic, Normal external eye/conjunctiva.  Normal external ears. Normal external nose, mucus membranes and septum.   Cardiovascular - Regular rate and rhythm  Respiratory - Lungs clear bilaterally. No wheezing. Abdomen - soft and non-tender, BS normal Extremities- no edema or cyanosis Neurological Examination Mental Status: Alert, oriented, thought content appropriate.  Speech dysarthric without evidence of aphasia.  Able to follow 3 step commands without difficulty. Cranial Nerves: II:  Visual fields grossly normal,  III,IV, VI: ptosis not present, extra-ocular motions intact bilaterally, pupils equal, round, reactive to light and accommodation V,VII: smile -  right facial droop, facial light touch sensation normal bilaterally at this time VIII: hearing normal bilaterally IX,X: uvula rises symmetrically  XI: bilateral shoulder shrug XII: midline tongue extension Motor: Right : Upper extremity   5/5    Left:     Upper extremity   5/5  Lower extremity   5/5     Lower extremity   5/5 Tone and bulk:normal tone throughout; no atrophy noted Sensory: Pinprick and light touch intact throughout, bilaterally Deep Tendon Reflexes: 2+ and symmetric throughout with no ankle jerk Plantars: Right: downgoing   Left: downgoing Cerebellar: normal finger-to-nose, and normal heel-to-shin  test Gait: Not tested  IMAGING: I have personally reviewed the radiological images below and agree with the radiology interpretations. Ct Head Wo Contrast Result Date: 12/13/2017 IMPRESSION: No acute abnormality. Atrophy and chronic microvascular ischemic change. Atherosclerosis. Electronically Signed   By: Inge Rise M.D.   On: 12/13/2017 11:12   Mr Brain Wo Contrast Result Date: 12/13/2017 IMPRESSION: 1 cm acute infarction affecting a left frontoparietal gyrus, probably the precentral gyrus. No sign of hemorrhage or swelling. Chronic small-vessel ischemic changes elsewhere throughout the brain, somewhat progressive since 2014. Electronically Signed   By: Nelson Chimes M.D.   On: 12/13/2017 15:16   Echocardiogram:                                              PENDING B/L Carotid U/S:                                                PENDING     IMPRESSION: [name] [name] is a 72 y.o. male with PMH of  type 2 diabetes, stroke, PVD, hypertension, hyperlipidemia, ITP, claudication, aortic stenosis, aortic valve replacement presenting to the emergency department with a small left cortical stroke in the setting of subtherapeutic INR. MRI reveals:  1 cm acute infarction affecting a left frontoparietal gyrus,  Suspected Etiology: subtherapeutic INR Resultant Symptoms: Right facial droop, dysarthria and Right sided numbness Stroke Risk Factors: hyperlipidemia, hypertension and Aortic valve replacement on Coumadin Other Stroke Risk Factors: Advanced age, Hx stroke/TIA, CAD   Outstanding Stroke Work-up Studies:     Echocardiogram:                                                    PENDING B/L Carotid U/S:                                                     PENDING  12/14/2017 ASSESSMENT:   Neuro exam stable. Right sided numbness resolved. Right facial droop and dysarthria improving slowly. IV Heparin in progress, Coumadin restarted. ASA discontinued. LDL 92. Lovaza added. Unable to  afford Zetia. Imaging and POC reviewed with patient. Will continue to follow until ECHO and Carotid reviewed.  PLAN  12/14/2017: Continue Coumadin/ Statin Continue IV Heparin until INR therapeutic Frequent neuro checks Telemetry monitoring PT/OT/SLP Ongoing aggressive stroke risk factor management Patient counseled to be compliant with his antithrombotic medications Patient counseled on Lifestyle modifications including, Diet, Exercise,  and Stress Follow up with [facility] in 6 weeks  MECHANICAL HEART VALVE: INR subtherapeutic on Coumadin  IV Heparin in progress - continue until INR therapeutic at Goal of 2.5-3.5 Outpatient Cardiology Follow up  HYPERTENSION: Stable Permissive hypertension (OK if <220/120) for 24-48 hours post stroke and then gradually normalized within 5-7 days. Long term BP goal normotensive. May slowly restart home B/P medications after 48 hours Home Meds: Norvasc, HCTZ, Metoprolol  HYPERLIPIDEMIA:    Component Value Date/Time   CHOL 173 12/13/2017 1551   TRIG 75 12/13/2017 1551   HDL 66 12/13/2017 1551   CHOLHDL 2.6 12/13/2017 1551   VLDL 15 12/13/2017 1551   LDLCALC 92 12/13/2017 1551  Home Meds:  Lipitor 80g LDL  goal < 70 Continued on Lipitor to 80 mg daily Will add Lovaza, offered Zetia but patient says it is too expensive for him at this time. Continue statin and Lovaza at discharge  DIABETES: Lab Results  Component Value Date   HGBA1C 6.8 (H) 12/13/2017  HgbA1c goal < 7.0 Currently on: Novolog Continue CBG monitoring and SSI to maintain glucose 140-180 mg/dl DM education   Other Active Problems: Active Problems:   Diabetes ([place])   HYPERCHOLESTEROLEMIA   Immune thrombocytopenic purpura (HCC)   Essential hypertension   Coronary atherosclerosis   Aortic valve disorder   Transient cerebral ischemia   PVD (peripheral vascular disease) (HCC)   Aortoiliac occlusive disease (HCC)   Cerebral infarction ([place])   S/P AVR    Renal artery stenosis, native, bilateral [facility])    Hospital day # 0 VTE prophylaxis: Warfarin Diet : Diet heart healthy/carb modified Room service appropriate? Yes; Fluid consistency: Thin   FAMILY UPDATES: family at bedside  TEAM UPDATES: Nita Sells, MD   Prior Home Stroke Medications:  warfarin daily  Discharge Stroke Meds:  Please discharge patient on warfarin daily   Disposition: 01-Home or Self Care Therapy Recs:               PENDING Home Equipment:         PENDING Follow Up:  Follow-up Information    Garvin Fila, MD. Schedule an appointment as soon as possible for a visit in 6 week(s).   Specialties:  Neurology, Radiology Contact information: [address] [place] [address] [phone]          Shon Baton, MD -PCP Follow up in 1-2 weeks      Assessment & plan discussed with with attending physician and they are in agreement.    Mary Sella, ANP-C Stroke Neurology Team 12/14/2017 12:11 PM I have personally examined this patient, reviewed notes, independently viewed imaging studies, participated in medical decision making and plan of care.ROS completed by me personally and pertinent positives fully documented  I have made any additions or clarifications directly to the above note. Agree with note above.  He has presented with embolic left frontal MCA branch infarct secondary to mechanical heart valve with suboptimal anticoagulation. I had a long discussion with the patient and his friend regarding difficulties with regulating INR on warfarin  and absence of any dataof using the new anticoagulants in patient with mechanical heart valve. He was unfortunately have to stay on IV heparin for bridging as Lovenox has been found to have much higher rate of intracranial bleed in the setting of an acute stroke. He understands and agrees with plan. Pharmacy to dose warfarin. Discussed with Dr. Georgette Dover.Greater than 50% time during this 35 minute visit  was spent on counseling and coordination of care about his embolic stroke and anticoagulation and answered questions  Antony Contras, MD Medical Director [place] Pager: [phone] 12/14/2017 2:21 PM  To contact Stroke Continuity provider, please refer to [email or web address] After hours, contact General Neurology

## 2017-12-14 NOTE — Progress Notes (Signed)
Hospitalist progress note   [name] [name]  OEH:[id number] DOB: [birth year] DOA: 12/13/2017 PCP: Shon Baton, MD   Specialists:   Brief Narrative:  38 TIA, DM ty ii, itp,htn,hld, prior CAd s/p CABG 2004 +St Jude Mech AVR, Aortobifem Bypass-L Ext Iliac and L Comm Fem endarterectomy, L renal art stent 02/11/15, R SFA atherectomy 04/22/15-multiple other studies-usually on Coumadin Admit 1/7 R facial droop -stroke team consulted  Assessment & Plan:   Assessment:  The encounter diagnosis was Stroke-like symptom.  Stroke-infarct on MRI L fronto-parietal-adding asa to coumadin-was subt on admit-usually doesn't eat greens and hasn't changed diet Mech valve INr 1/1-bridge c heparin-start coumadin per pharmacy-needs INR closer to 2.5 prior to d/c-not ready for d/c CAD s/p CABG-Adding ASa now to comadin-add back gradually amlodipine 10, toprol xl 50, hctx 12.5 DM ty ii-holding glyburide 2.5, invokana 100 qd-cont ssi-cbg 146-242  DVT prophylaxis: on AC  Code Status:   Full   Family Communication:   inpt  Disposition Plan: pending INR>2.5   Consultants:    neuro  Procedures:   Multiple   Antimicrobials:   none   Subjective:  awake alert in no distress seems at baseline  Objective: Vitals:   12/13/17 2108 12/14/17 0051 12/14/17 0650 12/14/17 1012  BP: (!) 140/40 (!) 145/44 (!) 135/53 (!) 112/44  Pulse: 68 (!) 57 65 65  Resp: 18 18 18 16   Temp: 98.1 F (36.7 C) 97.8 F (36.6 C) 97.9 F (36.6 C) 98.1 F (36.7 C)  TempSrc: Oral Oral Oral Oral  SpO2: 95% 94% 95% 98%  Weight:      Height:        Intake/Output Summary (Last 24 hours) at 12/14/2017 1406 Last data filed at 12/14/2017 0829 Gross per 24 hour  Intake 1573.5 ml  Output -  Net 1573.5 ml   Filed Weights   12/13/17 1033  Weight: 74.8 kg (165 lb)    Examination: Slight slurred speech and drooping on mouth PERLLA Power 5/5 bilat cta b s1 s2 harsh M abd soft power 5/5 reflexes 2/3  Data Reviewed: I have  personally reviewed following labs and imaging studies  CBC: Recent Labs  Lab 12/13/17 1028 12/13/17 1053 12/14/17 0440  WBC 6.2  --  5.7  NEUTROABS 3.8  --   --   HGB 15.5 16.3 14.6  HCT 47.0 48.0 45.4  MCV 95.5  --  96.6  PLT 221  --  037   Basic Metabolic Panel: Recent Labs  Lab 12/13/17 1028 12/13/17 1053 12/14/17 0440  NA 139 141 139  K 4.4 4.3 3.7  CL 102 101 104  CO2 27  --  27  GLUCOSE 168* 170* 118*  BUN 11 12 13   CREATININE 0.98 1.00 0.80  CALCIUM 9.1  --  8.7*   GFR: Estimated Creatinine Clearance: 80.8 mL/min (by C-G formula based on SCr of 0.8 mg/dL). Liver Function Tests: Recent Labs  Lab 12/13/17 1028 12/14/17 0440  AST 28 22  ALT 26 22  ALKPHOS 56 49  BILITOT 1.3* 1.0  PROT 7.0 6.0*  ALBUMIN 3.9 3.4*   No results for input(s): LIPASE, AMYLASE in the last 168 hours. No results for input(s): AMMONIA in the last 168 hours. Coagulation Profile: Recent Labs  Lab 12/13/17 1028 12/14/17 0440  INR 1.26 1.11   Cardiac Enzymes: No results for input(s): CKTOTAL, CKMB, CKMBINDEX, TROPONINI in the last 168 hours. CBG: Recent Labs  Lab 12/13/17 1125 12/13/17 2208 12/14/17 0822 12/14/17 1124  GLUCAP 146* 211*  242* 146*   Urine analysis:    Component Value Date/Time   COLORURINE STRAW (A) 12/13/2017 1551   APPEARANCEUR CLEAR 12/13/2017 1551   LABSPEC 1.015 12/13/2017 1551   PHURINE 7.0 12/13/2017 1551   GLUCOSEU >=500 (A) 12/13/2017 1551   HGBUR NEGATIVE 12/13/2017 1551   BILIRUBINUR NEGATIVE 12/13/2017 1551   KETONESUR 5 (A) 12/13/2017 1551   PROTEINUR NEGATIVE 12/13/2017 1551   UROBILINOGEN 0.2 01/15/2012 1415   NITRITE NEGATIVE 12/13/2017 1551   LEUKOCYTESUR NEGATIVE 12/13/2017 1551     Radiology Studies: Reviewed images personally in health database    Scheduled Meds: . atorvastatin  80 mg Oral Daily  . insulin aspart  0-9 Units Subcutaneous TID WC  . Melatonin  1 tablet Oral QHS  . omega-3 acid ethyl esters  1 g Oral BID   . warfarin  12.5 mg Oral ONCE-1800  . Warfarin - Pharmacist Dosing Inpatient   Does not apply q1800   Continuous Infusions: . sodium chloride 75 mL/hr at 12/14/17 0528  . heparin 950 Units/hr (12/14/17 0646)     LOS: 0 days    Time spent: [place], MD [facility]   If 7PM-7AM, please contact night-coverage [email or web address] Password [email or web address] 12/14/2017, 2:06 PM

## 2017-12-14 NOTE — Evaluation (Signed)
Occupational Therapy Evaluation Patient Details Name: [name] [name] MRN: [id number] DOB: 1946/10/05 Today's Date: 12/14/2017    History of Present Illness 72 y.o. male with a Past Medical History of TIA; DM; vasculopathy (RAS, PVD, CAD s/p CABG); ITP; HTN; HLD; and mechanical valve on Coumadin who presents with probable stroke.  Patient developed onset of R facial numbness and droop with some slurring of words over the weekend.  On exam, he continues to have R facial droop with diminished primarily V3 numbness on the R and otherwise is generally neurologically intact. MRI revealed 1 cm acute infarction affecting a left frontoparietal gyrus,probably the precentral gyrus. No sign of hemorrhage or swelling   Clinical Impression   PTA, pt was living alone and was independent. Pt currently performing ADLs and functional mobility at supervision level and presents near baseline function. Pt presenting with [facility] cognition, balance, and strength and reports that he feels back to baseline. Educated pt on BEFAST for stroke signs and symptoms; pt verbalized understanding. All acute OT needs met and will sign off. Thank you.    Follow Up Recommendations  No OT follow up;Supervision - Intermittent    Equipment Recommendations  None recommended by OT    Recommendations for Other Services       Precautions / Restrictions Precautions Precautions: None Restrictions Weight Bearing Restrictions: No      Mobility Bed Mobility               General bed mobility comments: Sitting at EOB with RN  Transfers Overall transfer level: Needs assistance Equipment used: None Transfers: Sit to/from Stand Sit to Stand: Supervision         General transfer comment: supervision for safety. No LOB or physical assist needs    Balance Overall balance assessment: No apparent balance deficits (not formally assessed)                                         ADL either performed or  assessed with clinical judgement   ADL Overall ADL's : Needs assistance/impaired                                       General ADL Comments: Pt performing ADLs and fucnitonal mobility at supervision level. Pt reports he feels back to baseline fucntion except for slight slurr in speech and R facial droop.     Vision Baseline Vision/History: Wears glasses Wears Glasses: Reading only Patient Visual Report: No change from baseline       Perception     Praxis      Pertinent Vitals/Pain Pain Assessment: No/denies pain     Hand Dominance Right   Extremity/Trunk Assessment Upper Extremity Assessment Upper Extremity Assessment: Overall WFL for tasks assessed   Lower Extremity Assessment Lower Extremity Assessment: Overall WFL for tasks assessed   Cervical / Trunk Assessment Cervical / Trunk Assessment: Normal   Communication Communication Communication: Other (comment)(Slight slurr)   Cognition Arousal/Alertness: Awake/alert Behavior During Therapy: WFL for tasks assessed/performed Overall Cognitive Status: Within Functional Limits for tasks assessed                                 General Comments: Pt WFL for tasks completed   General Comments  Educated pt on BEFAST for stroke signs and symptoms    Exercises     Shoulder Instructions      Home Living Family/patient expects to be discharged to:: Private residence Living Arrangements: Alone Available Help at Discharge: Friend(s);Available PRN/intermittently Type of Home: House(Condo) Home Access: Stairs to enter Entrance Stairs-Number of Steps: Two slights Entrance Stairs-Rails: Right Home Layout: One level     Bathroom Shower/Tub: Occupational psychologist: Standard     Home Equipment: Shower seat - built in          Prior Functioning/Environment Level of Independence: Independent        Comments: ADLs, IADLs, and enjoys playing golf        OT Problem List:  Impaired balance (sitting and/or standing);Decreased activity tolerance;Decreased knowledge of use of DME or AE      OT Treatment/Interventions:      OT Goals(Current goals can be found in the care plan section) Acute Rehab OT Goals Patient Stated Goal: Go home today OT Goal Formulation: All assessment and education complete, DC therapy  OT Frequency:     Barriers to D/C:            Co-evaluation              AM-PAC PT "6 Clicks" Daily Activity     Outcome Measure Help from another person eating meals?: None Help from another person taking care of personal grooming?: None Help from another person toileting, which includes using toliet, bedpan, or urinal?: None Help from another person bathing (including washing, rinsing, drying)?: None Help from another person to put on and taking off regular upper body clothing?: None Help from another person to put on and taking off regular lower body clothing?: None 6 Click Score: 24   End of Session Nurse Communication: Mobility status  Activity Tolerance: Patient tolerated treatment well Patient left: in bed;with call bell/phone within reach  OT Visit Diagnosis: Unsteadiness on feet (R26.81);Other abnormalities of gait and mobility (R26.89);Muscle weakness (generalized) (M62.81);Other symptoms and signs involving cognitive function                Time: 0922-0939 OT Time Calculation (min): 17 min Charges:  OT General Charges $OT Visit: 1 Visit OT Evaluation $OT Eval Low Complexity: 1 Low G-Codes:     Demosthenes Virnig MSOT, OTR/L Acute Rehab Pager: [phone] Office: [place] 12/14/2017, 10:21 AM

## 2017-12-14 NOTE — Progress Notes (Signed)
  Echocardiogram 2D Echocardiogram has been performed.  Edel Rivero T Rohit Deloria 12/14/2017, 2:17 PM

## 2017-12-15 ENCOUNTER — Ambulatory Visit (HOSPITAL_COMMUNITY)
Admission: RE | Admit: 2017-12-15 | Payer: Medicare Other | Source: Ambulatory Visit | Attending: Cardiovascular Disease | Admitting: Cardiovascular Disease

## 2017-12-15 DIAGNOSIS — E1149 Type 2 diabetes mellitus with other diabetic neurological complication: Secondary | ICD-10-CM

## 2017-12-15 DIAGNOSIS — I6300 Cerebral infarction due to thrombosis of unspecified precerebral artery: Secondary | ICD-10-CM

## 2017-12-15 LAB — CBC
HCT: 43 % (ref 39.0–52.0)
HEMOGLOBIN: 14 g/dL (ref 13.0–17.0)
MCH: 31.4 pg (ref 26.0–34.0)
MCHC: 32.6 g/dL (ref 30.0–36.0)
MCV: 96.4 fL (ref 78.0–100.0)
Platelets: 218 10*3/uL (ref 150–400)
RBC: 4.46 MIL/uL (ref 4.22–5.81)
RDW: 14.5 % (ref 11.5–15.5)
WBC: 5.4 10*3/uL (ref 4.0–10.5)

## 2017-12-15 LAB — BASIC METABOLIC PANEL
ANION GAP: 9 (ref 5–15)
BUN: 10 mg/dL (ref 6–20)
CALCIUM: 8.6 mg/dL — AB (ref 8.9–10.3)
CO2: 25 mmol/L (ref 22–32)
CREATININE: 0.8 mg/dL (ref 0.61–1.24)
Chloride: 105 mmol/L (ref 101–111)
GFR calc Af Amer: >60 mL/min (ref >60)
GFR calc non Af Amer: >60 mL/min (ref >60)
GLUCOSE: 122 mg/dL — AB (ref 65–99)
Potassium: 3.4 mmol/L — ABNORMAL LOW (ref 3.5–5.1)
Sodium: 139 mmol/L (ref 135–145)

## 2017-12-15 LAB — HEPARIN LEVEL (UNFRACTIONATED)
HEPARIN UNFRACTIONATED: 0.22 [IU]/mL — AB (ref 0.30–0.70)
HEPARIN UNFRACTIONATED: 0.31 [IU]/mL (ref 0.30–0.70)
Heparin Unfractionated: 0.26 IU/mL — ABNORMAL LOW (ref 0.30–0.70)

## 2017-12-15 LAB — GLUCOSE, CAPILLARY
GLUCOSE-CAPILLARY: 158 mg/dL — AB (ref 65–99)
Glucose-Capillary: 115 mg/dL — ABNORMAL HIGH (ref 65–99)
Glucose-Capillary: 129 mg/dL — ABNORMAL HIGH (ref 65–99)
Glucose-Capillary: 134 mg/dL — ABNORMAL HIGH (ref 65–99)

## 2017-12-15 LAB — PROTIME-INR
INR: 1.35
Prothrombin Time: 16.6 seconds — ABNORMAL HIGH (ref 11.4–15.2)

## 2017-12-15 MED ORDER — WARFARIN SODIUM 7.5 MG PO TABS
15.0000 mg | ORAL_TABLET | Freq: Once | ORAL | Status: AC
  Administered 2017-12-15: 15 mg via ORAL
  Filled 2017-12-15: qty 2

## 2017-12-15 MED ORDER — POTASSIUM CHLORIDE CRYS ER 20 MEQ PO TBCR
40.0000 meq | EXTENDED_RELEASE_TABLET | Freq: Once | ORAL | Status: AC
  Administered 2017-12-15: 40 meq via ORAL
  Filled 2017-12-15: qty 2

## 2017-12-15 NOTE — Care Management Note (Addendum)
Case Management Note  Patient Details  Name: Alf Doyle MRN: [id number] Date of Birth: 1946-10-09  Subjective/Objective:   Pt admitted with CVA. He is from home alone but does have an aide. Pt on coumadin at home for a mechanical heart valve but admitted with subtherapeutic INR.                  Action/Plan: No f/u and no DME needs per PT/OT. Plan is for patient to d/c home once INR therapeutic. CM following.   Expected Discharge Date:                  Expected Discharge Plan:  Home/Self Care  In-House Referral:     Discharge planning Services     Post Acute Care Choice:    Choice offered to:     DME Arranged:    DME Agency:     HH Arranged:    HH Agency:     Status of Service:  In process, will continue to follow  If discussed at Long Length of Stay Meetings, dates discussed:    Additional Comments:  Pollie Friar, RN 12/15/2017, 12:06 PM

## 2017-12-15 NOTE — Consult Note (Addendum)
            [facility] CM Primary Care Navigator  12/15/2017  [name] [name] 1946/03/08 [id number]   Went to see patient at the bedside to identify possible discharge needs. Patient reportshaving headache, numbness to right upper extremity up to his right jaw, facial droop and slurred speechthat resulted to this admission. Patient endorsesDr. [facility]on [facility].   [facility] [place] [facility]   Patientstatesthat he ismanaging his own medications at home without difficulty.  Patient verbalized that he has been driving prior to admission and transporting self to hisdoctors'appointments.  Patient reports living alone and "self sufficient". He states that he is the caregiver for himself. His 2 daughters live out of state ([place]).    Discharge plan is home per patient without need for home health services per therapy evaluation.  Patientvoiced understanding to call primary care provider's office when he returns home, for a post discharge follow-up appointment within 1-2 weeks or sooner if needed.Patient letter (with PCP's contact number) was provided asa reminder.  Explained to patient about [facility] CM services and resources available for health management at home but he denies needing any of the services at this time. He also mentioned that he is managing diabetes well with oral medications, diet restriction, regular exercise, monitoring blood sugars and follow-up with provider when needed.  Patientvoiced understandingof need to seek referral from primary care provider to [facility] care management whennecessaryand deemed appropriate for services in the near future.  [facility] care management information provided for future needs thathe may have.  Patienthad verbally agreed forEMMI strokecalls tofollow himupat home ashe recovers.   Notedorder for EMMI stroke calls after dischargealready in place.   For additional questions please contact:  Edwena Felty A. Lakeria Starkman, BSN, RN-BC [facility] PRIMARY CARE Navigator Cell: [phone]

## 2017-12-15 NOTE — Progress Notes (Signed)
ANTICOAGULATION CONSULT NOTE - Follow Up Consult  Pharmacy Consult for Heparin>warfarin  Indication: atrial fibrillation, stroke and mechanical AVR  No Known Allergies  Patient Measurements: Height: 5\' 8"  (172.7 cm) Weight: 165 lb (74.8 kg) IBW/kg (Calculated) : 68.4  Vital Signs: Temp: 98.2 F (36.8 C) (01/09 0619) Temp Source: Oral (01/09 0619) BP: 170/49 (01/09 0619) Pulse Rate: 73 (01/09 0619)  Labs: Recent Labs    12/13/17 1028 12/13/17 1053  12/14/17 0440 12/14/17 1403 12/14/17 2340 12/15/17 0443  HGB 15.5 16.3  --  14.6  --   --  14.0  HCT 47.0 48.0  --  45.4  --   --  43.0  PLT 221  --   --  223  --   --  218  APTT 32  --   --   --   --   --   --   LABPROT 15.7*  --   --  14.2  --   --  16.6*  INR 1.26  --   --  1.11  --   --  1.35  HEPARINUNFRC  --   --    < > <0.10* 0.13* 0.26* 0.22*  CREATININE 0.98 1.00  --  0.80  --   --  0.80   < > = values in this interval not displayed.    Estimated Creatinine Clearance: 80.8 mL/min (by C-G formula based on SCr of 0.8 mg/dL).  Assessment: On warfarin PTA for afib/mech AVR, here with stroke, INR is sub-therapeutic, on heparin drip, heparin level still low after rate adjustment this morning, no issues per RN.   Warfarin not documented as given last night, verified with patient that he did receive dose, will chart against yesterdays order.   INR still low 1.3, adjusting heparin to goal. No issues noted per patient.    Goal of Therapy:  INR goal 2.5-3.5 (closer to ~2.5) Heparin level 0.3-0.5 units/ml Monitor platelets by anticoagulation protocol: Yes   Plan:  Will continue heparin until INR responds Will give 15mg  of warfarin (2x home dose) Daily coags  Erin Hearing PharmD., BCPS Clinical Pharmacist Pager [phone] 12/15/2017 9:50 AM

## 2017-12-15 NOTE — Progress Notes (Addendum)
NEUROHOSPITALISTS STROKE TEAM - DAILY PROGRESS NOTE   ADMISSION HISTORY: [name] [name] is an 72 y.o. male with history of type 2 diabetes, stroke, PVD, hypertension, hyperlipidemia, ITP, claudication, aortic stenosis, aortic valve replacement.  Patient currently is on Coumadin with an INR of 1.29.  Patient states that over prolonged period of time he had had intermittent right facial numbness and right arm numbness that would last for approximately 10 to up to 40 minutes but then resolved.  He never seek attention as he did not feel the knees were that important and they would resolve.  This past Saturday he noted that he had a right facial droop, dysarthria which was worse than normal, and right arm numbness that was intermittent.  Unfortunately, these symptoms did not resolve as they usually do thus patient came to the ED.  Currently he still has a right facial droop, moderate dysarthria which she says actually is waxing and waning while he has been in the emergency department 5/10 but no arm symptoms.  Date last known well: Date: 12/11/2017 Time last known well: Unable to determine tPA Given: No: Out of the window NIH stroke scale 3 Modified Rankin: Rankin Score=0  SUBJECTIVE (INTERVAL HISTORY) No family  is at the bedside. Patient is found laying in bed in NAD. Overall he feels his condition is unchanged. Voices no new complaints. No new events reported overnight. He remains on IV heparin. INR this morning is up to 1.32 echocardiogram and carotid ultrasound studies of both unremarkable   OBJECTIVE Lab Results: CBC:  LastLabs       Recent Labs  Lab 12/13/17 1028 12/13/17 1053 12/14/17 0440  WBC 6.2  --  5.7  HGB 15.5 16.3 14.6  HCT 47.0 48.0 45.4  MCV 95.5  --  96.6  PLT 221  --  223     BMP: LastLabs  Recent Labs  Lab 12/13/17 1028 12/13/17 1053 12/14/17 0440  NA 139 141 139  K 4.4 4.3 3.7  CL 102 101 104  CO2 27  --  27  GLUCOSE 168* 170* 118*  BUN 11  12 13   CREATININE 0.98 1.00 0.80  CALCIUM 9.1  --  8.7*     Liver Function Tests:  LastLabs      Recent Labs  Lab 12/13/17 1028 12/14/17 0440  AST 28 22  ALT 26 22  ALKPHOS 56 49  BILITOT 1.3* 1.0  PROT 7.0 6.0*  ALBUMIN 3.9 3.4*     Thyroid Function Studies:  RecentLabs(last2labs)  Recent Labs    12/13/17 1728  TSH 2.178     Coagulation Studies:  RecentLabs(last2labs)      Recent Labs    12/13/17 1028 12/14/17 0440  APTT 32  --   INR 1.26 1.11     PHYSICAL EXAM Temp:  [97.8 F (36.6 C)-98.6 F (37 C)] 98.1 F (36.7 C) (01/08 1012) Pulse Rate:  [57-70] 65 (01/08 1012) Resp:  [16-20] 16 (01/08 1012) BP: (112-199)/(31-53) 112/44 (01/08 1012) SpO2:  [94 %-98 %] 98 % (01/08 1012) General - Well nourished, well developed, in no apparent distress HEENT-  Normocephalic, Normal external eye/conjunctiva.  Normal external ears. Normal external nose, mucus membranes and septum.   Cardiovascular - Regular rate and rhythm  Respiratory - Lungs clear bilaterally. No wheezing. Abdomen - soft and non-tender, BS normal Extremities- no edema or cyanosis Neurological Examination Mental Status: Alert, oriented, thought content appropriate.  Speech dysarthric without evidence of aphasia.  Able to follow 3 step commands  without difficulty. Cranial Nerves: II:  Visual fields grossly normal,  III,IV, VI: ptosis not present, extra-ocular motions intact bilaterally, pupils equal, round, reactive to light and accommodation V,VII: smile -  right facial droop, facial light touch sensation normal bilaterally at this time VIII: hearing normal bilaterally IX,X: uvula rises symmetrically XI: bilateral shoulder shrug XII: midline tongue extension Motor: Right :  Upper extremity   5/5                                      Left:     Upper extremity   5/5             Lower extremity   5/5                                                  Lower extremity   5/5 Tone and  bulk:normal tone throughout; no atrophy noted Sensory: Pinprick and light touch intact throughout, bilaterally Deep Tendon Reflexes: 2+ and symmetric throughout with no ankle jerk Plantars: Right: downgoing                                Left: downgoing Cerebellar: normal finger-to-nose, and normal heel-to-shin test Gait: Not tested  IMAGING: I have personally reviewed the radiological images below and agree with the radiology interpretations. Ct Head Wo Contrast Result Date: 12/13/2017 IMPRESSION: No acute abnormality. Atrophy and chronic microvascular ischemic change. Atherosclerosis. Electronically Signed   By: Inge Rise M.D.   On: 12/13/2017 11:12   Mr Brain Wo Contrast Result Date: 12/13/2017 IMPRESSION: 1 cm acute infarction affecting a left frontoparietal gyrus, probably the precentral gyrus. No sign of hemorrhage or swelling. Chronic small-vessel ischemic changes elsewhere throughout the brain, somewhat progressive since 2014. Electronically Signed   By: Nelson Chimes M.D.   On: 12/13/2017 15:16   Echocardiogram:                                              PENDING B/L Carotid U/S:                                                PENDING     IMPRESSION: [name] is a 72 y.o. male with PMH of  type 2 diabetes, stroke, PVD, hypertension, hyperlipidemia, ITP, claudication, aortic stenosis, aortic valve replacement presenting to the emergency department with a small left cortical stroke in the setting of subtherapeutic INR. MRI reveals:  1 cm acute infarction affecting a left frontoparietal gyrus,  Suspected Etiology: subtherapeutic INR Resultant Symptoms: Right facial droop, dysarthria and Right sided numbness Stroke Risk Factors: hyperlipidemia, hypertension and Aortic valve replacement on Coumadin Other Stroke Risk Factors: Advanced age, Hx stroke/TIA, CAD      Echocardiogram:          Left ventricle: The cavity size was normal. Wall thickness  was   increased in a pattern of moderate LVH. Systolic function was  normal. The estimated ejection fraction was in the range of 60%   to 65%. Left ventricular diastolic function parameters were   normal.                                            B/L Carotid U/S: No significant bilateral carotid stenosis                                                       [id number] ASSESSMENT:   Neuro exam stable. Right sided numbness resolved. Right facial droop and dysarthria improving slowly. IV Heparin in progress, Coumadin restarted. PLAN  12/15/2017: Continue Coumadin/ Statin Continue IV Heparin until INR therapeutic Frequent neuro checks Telemetry monitoring PT/OT/SLP Ongoing aggressive stroke risk factor management Patient counseled to be compliant withhisantithrombotic medications Patient counseled on Lifestyle modifications including, Diet, Exercise, and Stress Follow up with [facility] in 6 weeks  MECHANICAL HEART VALVE: INR subtherapeutic on Coumadin  IV Heparin in progress - continue until INR therapeutic at Goal of 2.5-3.5 Outpatient Cardiology Follow up  HYPERTENSION: Stable Permissive hypertension (OK if <220/120) for 24-48 hours post stroke and then gradually normalized within 5-7 days. Long term BP goal normotensive. May slowly restart home B/P medications after 48 hours Home Meds: Norvasc, HCTZ, Metoprolol  HYPERLIPIDEMIA: Labs(Brief)          Component Value Date/Time   CHOL 173 12/13/2017 1551   TRIG 75 12/13/2017 1551   HDL 66 12/13/2017 1551   CHOLHDL 2.6 12/13/2017 1551   VLDL 15 12/13/2017 1551   LDLCALC 92 12/13/2017 1551    Home Meds:  Lipitor 80g LDL  goal < 70 Continued on Lipitor to 80 mg daily Will add Lovaza, offered Zetia but patient says it is too expensive for him at this time. Continue statin and Lovaza at discharge  DIABETES: RecentLabs       Lab Results  Component Value Date   HGBA1C 6.8 (H) 12/13/2017     HgbA1c goal < 7.0 Currently on: Novolog Continue CBG monitoring and SSI to maintain glucose 140-180 mg/dl DM education   Other Active Problems: Active Problems:   Diabetes ([place])   HYPERCHOLESTEROLEMIA   Immune thrombocytopenic purpura (HCC)   Essential hypertension   Coronary atherosclerosis   Aortic valve disorder   Transient cerebral ischemia   PVD (peripheral vascular disease) (HCC)   Aortoiliac occlusive disease (HCC)   Cerebral infarction ([place])   S/P AVR   Renal artery stenosis, native, bilateral [facility])    Hospital day # 0 VTE prophylaxis: Warfarin Diet : Diet heart healthy/carb modified Room service appropriate? Yes; Fluid consistency: Thin   FAMILYUPDATES: family at bedside  TEAM UPDATES: Nita Sells, MD   Prior Home Stroke Medications:  warfarin daily  Discharge Stroke Meds:  Please discharge patient on warfarin daily   Disposition: 01-Home or Self Care Therapy Recs:              PENDING Home Equipment:         PENDING Follow Up:     Follow-up Information    Garvin Fila, MD. Schedule an appointment as soon as possible for a visit in 6 week(s).   Specialties:  Neurology, Radiology Contact information:  [address] [place] [address] [phone]          Shon Baton, MD -PCP Follow up in 1-2 weeks            I have personally examined this patient, reviewed notes, independently viewed imaging studies, participated in medical decision making and plan of care.ROS completed by me personally and pertinent positives fully documented  I have made any additions or clarifications directly to the above note. Agree with note above.  He has presented with embolic left frontal MCA branch infarct secondary to mechanical heart valve with suboptimal anticoagulation. I had a long discussion with the patient and his friend regarding difficulties with regulating INR on warfarin  and absence of any dataof using the new  anticoagulants in patient with mechanical heart valve. He was unfortunately have to stay on IV heparin for bridging as Lovenox has been found to have much higher rate of intracranial bleed in the setting of an acute stroke. He understands and agrees with plan. Pharmacy to dose warfarin. Discussed with Dr. Algis Liming Greater than 50% time during this 15 minute visit was spent on counseling and coordination of care about his embolic stroke and anticoagulation and answered questions Stroke team will sign off. Call for questions. Antony Contras, MD Medical Director [facility] Pager: [phone] 12/14/2017 2:21 PM  To contact Stroke Continuity provider, please refer to [email or web address] After hours, contact General Neurology

## 2017-12-15 NOTE — Progress Notes (Addendum)
PROGRESS NOTE   [name] [name]  JKK:[id number]    DOB: 05-30-1946    DOA: 12/13/2017  PCP: Shon Baton, MD   I have briefly reviewed patients previous medical records in [facility].  Brief Narrative:  72 year old male with PMH of DM 2, CVA, PAD, HTN, HLD, ITP, status post mechanical aortic valve on chronic Coumadin presented with right facial droop, dysarthria in the context of prior (over a prolonged period of time) intermittent right facial numbness and right arm numbness for which he did not seek medical attention. Diagnosed with acute stroke in the context of subtherapeutic INR 1.29. Evaluated under neurology/stroke team guidance. Currently on IV heparin awaiting INR to be therapeutic prior to discharge.   Assessment & Plan:   Active Problems:   Diabetes ([place])   HYPERCHOLESTEROLEMIA   Immune thrombocytopenic purpura ([place])   Essential hypertension   Coronary atherosclerosis   Aortic valve disorder   Transient cerebral ischemia   PVD (peripheral vascular disease) (HCC)   Aortoiliac occlusive disease (HCC)   Cerebral infarction (HCC)   S/P AVR   Renal artery stenosis, native, bilateral (HCC)   Stroke-like symptoms   1. Acute embolic left brain stroke: Suspected due to subtherapeutic INR. Presented with right facial droop, dysarthria and right-sided numbness. Completed stroke workup under neurology guidance. MRI brain showed 1 cm acute infarction affecting left frontoparietal gyrus. LDL 92, A1c 6.8. Carotid Dopplers: No significant bilateral carotid stenosis. 2-D echo: LVEF 60-65 percent. No report of clots on AV. Right sided numbness resolved. Right facial droop and dysarthria improving. Currently on IV heparin bridging and Coumadin per pharmacy. INR still subtherapeutic at 1.3 and may take several days to reach goal between 2.5-3.5. 2. S/P mechanical aVR: INR subtherapeutic on admission, unclear etiology. Reports compliance to medications and no recent change in diet. IV  heparin bridging and Coumadin per pharmacy. 3. Essential hypertension: Allow permissive hypertension for 24-48 hours post stroke, gradually start medications after 48 hours. Currently amlodipine, HCTZ, metoprolol on hold. Consider starting metoprolol 1/10. 4. Hyperlipidemia: Goal <70. Continue atorvastatin 80 MG daily. Lovaza added. Patient declines Zetia. 5. Type II DM: A1c 6.8, goal <7. Reasonably controlled on SSI in the hospital. Holding glyburide and Invokana 6. PAD: Follows with outpatient cardiology/Dr. Gwenlyn Found. 7. CAD status post CABG: Asymptomatic of chest pain. 8. Hypokalemia: Replace and follow.   DVT prophylaxis: IV heparin infusion and Coumadin per pharmacy. Code Status: Full Family Communication: None at bedside Disposition: DC home when INR therapeutic >2.5.   Consultants:  Neurology  Updated patient's primary Cardiologist of patient's admission.  Procedures:  As above  Antimicrobials:  None    Subjective: Feels better. Right-sided facial droop and dysarthria improving. Denies limb weakness.   ROS: No chest pain, palpitations or dyspnea.  Objective:  Vitals:   12/15/17 0050 12/15/17 0619 12/15/17 1033 12/15/17 1449  BP: (!) 132/39 (!) 170/49 (!) 132/51 134/62  Pulse: 73 73 73 75  Resp: 18 18 16 16   Temp: 98.1 F (36.7 C) 98.2 F (36.8 C) 99 F (37.2 C) 98.6 F (37 C)  TempSrc: Oral Oral Oral Oral  SpO2: 95% 94% 96% 95%  Weight:      Height:        Examination:  General exam: Pleasant elderly male, lying comfortably propped up in bed. Respiratory system: Clear to auscultation. Respiratory effort normal. Cardiovascular system: S1 & S2 heard, RRR. No JVD, murmurs, rubs, gallops or clicks. No pedal edema. Crisp click of mechanical aortic valve appreciated. Telemetry: Sinus  rhythm. Brief? A. fib on 1/8 at 1:15 PM. Gastrointestinal system: Abdomen is nondistended, soft and nontender. No organomegaly or masses felt. Normal bowel sounds heard. Central  nervous system: Alert and oriented. Right upper motor neuron facial palsy and mild dysarthria. No other cranial nerve deficits. Extremities: Symmetric 5 x 5 power. Skin: No rashes, lesions or ulcers Psychiatry: Judgement and insight appear normal. Mood & affect appropriate.     Data Reviewed: I have personally reviewed following labs and imaging studies  CBC: Recent Labs  Lab 12/13/17 1028 12/13/17 1053 12/14/17 0440 12/15/17 0443  WBC 6.2  --  5.7 5.4  NEUTROABS 3.8  --   --   --   HGB 15.5 16.3 14.6 14.0  HCT 47.0 48.0 45.4 43.0  MCV 95.5  --  96.6 96.4  PLT 221  --  223 480   Basic Metabolic Panel: Recent Labs  Lab 12/13/17 1028 12/13/17 1053 12/14/17 0440 12/15/17 0443  NA 139 141 139 139  K 4.4 4.3 3.7 3.4*  CL 102 101 104 105  CO2 27  --  27 25  GLUCOSE 168* 170* 118* 122*  BUN 11 12 13 10   CREATININE 0.98 1.00 0.80 0.80  CALCIUM 9.1  --  8.7* 8.6*   Liver Function Tests: Recent Labs  Lab 12/13/17 1028 12/14/17 0440  AST 28 22  ALT 26 22  ALKPHOS 56 49  BILITOT 1.3* 1.0  PROT 7.0 6.0*  ALBUMIN 3.9 3.4*   Coagulation Profile: Recent Labs  Lab 12/13/17 1028 12/14/17 0440 12/15/17 0443  INR 1.26 1.11 1.35   HbA1C: Recent Labs    12/13/17 1551  HGBA1C 6.8*   CBG: Recent Labs  Lab 12/14/17 0822 12/14/17 1124 12/14/17 1601 12/15/17 0613 12/15/17 1135  GLUCAP 242* 146* 135* 115* 158*    No results found for this or any previous visit (from the past 240 hour(s)).       Radiology Studies: No results found.      Scheduled Meds: . atorvastatin  80 mg Oral Daily  . insulin aspart  0-9 Units Subcutaneous TID WC  . Melatonin  1 tablet Oral QHS  . omega-3 acid ethyl esters  1 g Oral BID  . warfarin  15 mg Oral ONCE-1800  . Warfarin - Pharmacist Dosing Inpatient   Does not apply q1800   Continuous Infusions: . sodium chloride 75 mL/hr at 12/15/17 0634  . heparin 1,400 Units/hr (12/15/17 1236)     LOS: 1 day     Vernell Leep, MD, FACP, [facility]. [facility] Pager [phone]  If 7PM-7AM, please contact night-coverage [email or web address] Password [email or web address] 12/15/2017, 3:50 PM

## 2017-12-15 NOTE — Progress Notes (Signed)
ANTICOAGULATION CONSULT NOTE  Pharmacy Consult for Heparin Indication: atrial fibrillation, stroke and mechanical AVR  No Known Allergies  Patient Measurements: Height: 5\' 8"  (172.7 cm) Weight: 165 lb (74.8 kg) IBW/kg (Calculated) : 68.4  Vital Signs: Temp: 98.1 F (36.7 C) (01/09 0050) Temp Source: Oral (01/09 0050) BP: 132/39 (01/09 0050) Pulse Rate: 73 (01/09 0050)  Labs: Recent Labs    12/13/17 1028 12/13/17 1053 12/14/17 0440 12/14/17 1403 12/14/17 2340  HGB 15.5 16.3 14.6  --   --   HCT 47.0 48.0 45.4  --   --   PLT 221  --  223  --   --   APTT 32  --   --   --   --   LABPROT 15.7*  --  14.2  --   --   INR 1.26  --  1.11  --   --   HEPARINUNFRC  --   --  <0.10* 0.13* 0.26*  CREATININE 0.98 1.00 0.80  --   --     Estimated Creatinine Clearance: 80.8 mL/min (by C-G formula based on SCr of 0.8 mg/dL).  Assessment: 72 y.o. male with AFib/AVR for heparin  Goal of Therapy:  INR goal 2.5-3.5 (closer to ~2.5) Heparin level 0.3-0.5 units/ml Monitor platelets by anticoagulation protocol: Yes   Plan:  Increase Heparin 1350 units/hr Check heparin level in 6 hours.   Phillis Knack, PharmD, BCPS  12/15/2017 1:11 AM

## 2017-12-16 LAB — BASIC METABOLIC PANEL
Anion gap: 9 (ref 5–15)
BUN: 7 mg/dL (ref 6–20)
CO2: 25 mmol/L (ref 22–32)
CREATININE: 0.77 mg/dL (ref 0.61–1.24)
Calcium: 8.8 mg/dL — ABNORMAL LOW (ref 8.9–10.3)
Chloride: 104 mmol/L (ref 101–111)
GFR calc Af Amer: >60 mL/min (ref >60)
GFR calc non Af Amer: >60 mL/min (ref >60)
Glucose, Bld: 135 mg/dL — ABNORMAL HIGH (ref 65–99)
POTASSIUM: 3.5 mmol/L (ref 3.5–5.1)
Sodium: 138 mmol/L (ref 135–145)

## 2017-12-16 LAB — CBC
HEMATOCRIT: 44.1 % (ref 39.0–52.0)
Hemoglobin: 14.4 g/dL (ref 13.0–17.0)
MCH: 31.5 pg (ref 26.0–34.0)
MCHC: 32.7 g/dL (ref 30.0–36.0)
MCV: 96.5 fL (ref 78.0–100.0)
PLATELETS: 218 10*3/uL (ref 150–400)
RBC: 4.57 MIL/uL (ref 4.22–5.81)
RDW: 14.4 % (ref 11.5–15.5)
WBC: 5.9 10*3/uL (ref 4.0–10.5)

## 2017-12-16 LAB — PROTIME-INR
INR: 1.7
Prothrombin Time: 19.8 seconds — ABNORMAL HIGH (ref 11.4–15.2)

## 2017-12-16 LAB — GLUCOSE, CAPILLARY
GLUCOSE-CAPILLARY: 131 mg/dL — AB (ref 65–99)
GLUCOSE-CAPILLARY: 132 mg/dL — AB (ref 65–99)
GLUCOSE-CAPILLARY: 185 mg/dL — AB (ref 65–99)
Glucose-Capillary: 188 mg/dL — ABNORMAL HIGH (ref 65–99)

## 2017-12-16 LAB — HEPARIN LEVEL (UNFRACTIONATED): Heparin Unfractionated: 0.36 IU/mL (ref 0.30–0.70)

## 2017-12-16 MED ORDER — WARFARIN SODIUM 5 MG PO TABS
10.0000 mg | ORAL_TABLET | Freq: Once | ORAL | Status: AC
  Administered 2017-12-16: 10 mg via ORAL
  Filled 2017-12-16: qty 2

## 2017-12-16 MED ORDER — WARFARIN SODIUM 6 MG PO TABS: 12.0000 mg | ORAL_TABLET | Freq: Once | ORAL | Status: DC

## 2017-12-16 MED ORDER — METOPROLOL SUCCINATE ER 25 MG PO TB24
50.0000 mg | ORAL_TABLET | Freq: Every day | ORAL | Status: DC
  Administered 2017-12-16 – 2017-12-18 (×3): 50 mg via ORAL
  Filled 2017-12-16 (×3): qty 2

## 2017-12-16 NOTE — Progress Notes (Signed)
Pt has verbalized no complaints/pain today.

## 2017-12-16 NOTE — Progress Notes (Signed)
PROGRESS NOTE   [name] [name]  DUK:[id number]    DOB: 01-29-1946    DOA: 12/13/2017  PCP: Shon Baton, MD   I have briefly reviewed patients previous medical records in [facility].  Brief Narrative:  72 year old male with PMH of DM 2, CVA, PAD, HTN, HLD, ITP, status post mechanical aortic valve on chronic Coumadin presented with right facial droop, dysarthria in the context of prior (over a prolonged period of time) intermittent right facial numbness and right arm numbness for which he did not seek medical attention. Diagnosed with acute stroke in the context of subtherapeutic INR 1.29. Evaluated under neurology/stroke team guidance. Currently on IV heparin awaiting INR to be therapeutic prior to discharge.   Assessment & Plan:   Active Problems:   Diabetes ([place])   HYPERCHOLESTEROLEMIA   Immune thrombocytopenic purpura ([place])   Essential hypertension   Coronary atherosclerosis   Aortic valve disorder   Transient cerebral ischemia   PVD (peripheral vascular disease) (H[facility])   Aortoiliac occlusive disease (HCC)   Cerebral infarction (HCC)   S/P AVR   Renal artery stenosis, native, bilateral (HCC)   Stroke-like symptoms   1. Acute embolic left brain stroke: Suspected due to subtherapeutic INR. Presented with right facial droop, dysarthria and right-sided numbness. Completed stroke workup under neurology guidance. MRI brain showed 1 cm acute infarction affecting left frontoparietal gyrus. LDL 92, A1c 6.8. Carotid Dopplers: No significant bilateral carotid stenosis. 2-D echo: LVEF 60-65 percent. No report of clots on AV. Right sided numbness resolved. Right facial droop and dysarthria continue to improve. Currently on IV heparin bridging and Coumadin per pharmacy. INR still subtherapeutic at 1.7 but improving and may take next 1-2 days to reach goal between 2.5-3.5. 2. S/P mechanical aVR: INR subtherapeutic on admission, unclear etiology. Reports compliance to medications and no  recent change in diet. IV heparin bridging and Coumadin per pharmacy. Stable. 3. Essential hypertension: Allow permissive hypertension for 24-48 hours post stroke, gradually start medications after 48 hours. Currently amlodipine, HCTZ, metoprolol on hold. Started Metoprolol 1/10. 4. Hyperlipidemia: Goal <70. Continue atorvastatin 80 MG daily. Lovaza added. Patient declines Zetia. 5. Type II DM: A1c 6.8, goal <7. Reasonably controlled on SSI in the hospital. Holding glyburide and Invokana, may resume at DC 6. PAD: Follows with outpatient cardiology/Dr. Gwenlyn Found. 7. CAD status post CABG: Asymptomatic of chest pain. 8. Hypokalemia: Replaced   DVT prophylaxis: IV heparin infusion and Coumadin per pharmacy. Code Status: Full Family Communication: None at bedside Disposition: DC home when INR therapeutic >2.5. Possibly in the next 1-2 days.   Consultants:  Neurology  Updated patient's primary Cardiologist of patient's admission.  Procedures:  As above  Antimicrobials:  None    Subjective: Right-sided facial weakness and slurred speech continue to improve.   ROS: No chest pain, palpitations or dyspnea.  Objective:  Vitals:   12/15/17 2100 12/16/17 0539 12/16/17 0922 12/16/17 1325  BP: (!) 184/74 (!) 156/51 (!) 169/49 135/73  Pulse: 68 75 72 75  Resp: 17 18 18 18   Temp: 98.4 F (36.9 C) 98.2 F (36.8 C) 98.2 F (36.8 C) 97.8 F (36.6 C)  TempSrc: Oral Oral Oral Oral  SpO2: 96% 94% 96% 95%  Weight:      Height:        Examination:  General exam: Pleasant elderly male, sitting up comfortably in chair this morning. Respiratory system: Clear to auscultation. Respiratory effort normal. Stable without change. Cardiovascular system: S1 & S2 heard, RRR. No JVD, murmurs, rubs,  gallops or clicks. No pedal edema. Crisp click of mechanical aortic valve appreciated. Telemetry: Sinus rhythm. Gastrointestinal system: Abdomen is nondistended, soft and nontender. No organomegaly or masses  felt. Normal bowel sounds heard. Central nervous system: Alert and oriented. Right upper motor neuron facial palsy and mild dysarthria, improving. No other cranial nerve deficits. Extremities: Symmetric 5 x 5 power. Skin: No rashes, lesions or ulcers Psychiatry: Judgement and insight appear normal. Mood & affect appropriate.     Data Reviewed: I have personally reviewed following labs and imaging studies  CBC: Recent Labs  Lab 12/13/17 1028 12/13/17 1053 12/14/17 0440 12/15/17 0443 12/16/17 0612  WBC 6.2  --  5.7 5.4 5.9  NEUTROABS 3.8  --   --   --   --   HGB 15.5 16.3 14.6 14.0 14.4  HCT 47.0 48.0 45.4 43.0 44.1  MCV 95.5  --  96.6 96.4 96.5  PLT 221  --  223 218 671   Basic Metabolic Panel: Recent Labs  Lab 12/13/17 1028 12/13/17 1053 12/14/17 0440 12/15/17 0443 12/16/17 0612  NA 139 141 139 139 138  K 4.4 4.3 3.7 3.4* 3.5  CL 102 101 104 105 104  CO2 27  --  27 25 25   GLUCOSE 168* 170* 118* 122* 135*  BUN 11 12 13 10 7   CREATININE 0.98 1.00 0.80 0.80 0.77  CALCIUM 9.1  --  8.7* 8.6* 8.8*   Liver Function Tests: Recent Labs  Lab 12/13/17 1028 12/14/17 0440  AST 28 22  ALT 26 22  ALKPHOS 56 49  BILITOT 1.3* 1.0  PROT 7.0 6.0*  ALBUMIN 3.9 3.4*   Coagulation Profile: Recent Labs  Lab 12/13/17 1028 12/14/17 0440 12/15/17 0443 12/16/17 0612  INR 1.26 1.11 1.35 1.70   HbA1C: Recent Labs    12/13/17 1551  HGBA1C 6.8*   CBG: Recent Labs  Lab 12/15/17 1135 12/15/17 1643 12/15/17 2203 12/16/17 0624 12/16/17 0732  GLUCAP 158* 129* 134* 131* 132*    No results found for this or any previous visit (from the past 240 hour(s)).       Radiology Studies: No results found.      Scheduled Meds: . atorvastatin  80 mg Oral Daily  . insulin aspart  0-9 Units Subcutaneous TID WC  . Melatonin  1 tablet Oral QHS  . metoprolol succinate  50 mg Oral Daily  . omega-3 acid ethyl esters  1 g Oral BID  . warfarin  10 mg Oral ONCE-1800  .  Warfarin - Pharmacist Dosing Inpatient   Does not apply q1800   Continuous Infusions: . heparin 1,400 Units/hr (12/15/17 2015)     LOS: 2 days     Vernell Leep, MD, FACP, [facility]. [facility] Pager [phone]  If 7PM-7AM, please contact night-coverage [email or web address] Password [email or web address] 12/16/2017, 1:46 PM

## 2017-12-16 NOTE — Progress Notes (Addendum)
ANTICOAGULATION CONSULT NOTE - Follow Up Consult  Pharmacy Consult for Heparin>warfarin  Indication: atrial fibrillation, stroke and mechanical AVR  No Known Allergies  Patient Measurements: Height: 5\' 8"  (172.7 cm) Weight: 165 lb (74.8 kg) IBW/kg (Calculated) : 68.4  Vital Signs: Temp: 98.2 F (36.8 C) (01/10 0539) Temp Source: Oral (01/10 0539) BP: 156/51 (01/10 0539) Pulse Rate: 75 (01/10 0539)  Labs: Recent Labs    12/13/17 1028  12/14/17 0440  12/15/17 0443 12/15/17 1056 12/16/17 0612  HGB 15.5   < > 14.6  --  14.0  --  14.4  HCT 47.0   < > 45.4  --  43.0  --  44.1  PLT 221  --  223  --  218  --  218  APTT 32  --   --   --   --   --   --   LABPROT 15.7*  --  14.2  --  16.6*  --  19.8*  INR 1.26  --  1.11  --  1.35  --  1.70  HEPARINUNFRC  --   --  <0.10*   < > 0.22* 0.31 0.36  CREATININE 0.98   < > 0.80  --  0.80  --  0.77   < > = values in this interval not displayed.    Estimated Creatinine Clearance: 80.8 mL/min (by C-G formula based on SCr of 0.77 mg/dL).  Assessment: On warfarin PTA for afib/mech AVR, here with stroke, INR is sub-therapeutic, on heparin drip, heparin level still low after rate adjustment this morning, no issues per RN.   INR still below therapeutic range at 1.7 but trending up nicely after several boosted doses, heparin at goal this morning. No issues noted.    Goal of Therapy:  INR goal 2.5-3.5 (closer to ~2.5) Heparin level 0.3-0.5 units/ml Monitor platelets by anticoagulation protocol: Yes   Plan:  Will continue heparin for now Will back down on warfarin dosing slightly given trend and order 10mg  of warfarin tonight Daily coags  Erin Hearing PharmD., BCPS Clinical Pharmacist Pager [phone] 12/16/2017 8:42 AM

## 2017-12-17 LAB — HEPARIN LEVEL (UNFRACTIONATED): Heparin Unfractionated: 0.34 IU/mL (ref 0.30–0.70)

## 2017-12-17 LAB — CBC
HCT: 43.3 % (ref 39.0–52.0)
Hemoglobin: 14.2 g/dL (ref 13.0–17.0)
MCH: 31.7 pg (ref 26.0–34.0)
MCHC: 32.8 g/dL (ref 30.0–36.0)
MCV: 96.7 fL (ref 78.0–100.0)
Platelets: 212 10*3/uL (ref 150–400)
RBC: 4.48 MIL/uL (ref 4.22–5.81)
RDW: 14.4 % (ref 11.5–15.5)
WBC: 5.1 10*3/uL (ref 4.0–10.5)

## 2017-12-17 LAB — GLUCOSE, CAPILLARY
GLUCOSE-CAPILLARY: 242 mg/dL — AB (ref 65–99)
Glucose-Capillary: 168 mg/dL — ABNORMAL HIGH (ref 65–99)
Glucose-Capillary: 204 mg/dL — ABNORMAL HIGH (ref 65–99)
Glucose-Capillary: 217 mg/dL — ABNORMAL HIGH (ref 65–99)

## 2017-12-17 LAB — PROTIME-INR
INR: 2.12
PROTHROMBIN TIME: 23.5 s — AB (ref 11.4–15.2)

## 2017-12-17 MED ORDER — WARFARIN SODIUM 5 MG PO TABS
10.0000 mg | ORAL_TABLET | Freq: Once | ORAL | Status: AC
  Administered 2017-12-17: 10 mg via ORAL
  Filled 2017-12-17: qty 2

## 2017-12-17 NOTE — Progress Notes (Signed)
ANTICOAGULATION CONSULT NOTE - Follow Up Consult  Pharmacy Consult for Heparin>warfarin  Indication: atrial fibrillation, stroke and mechanical AVR  No Known Allergies  Patient Measurements: Height: 5\' 8"  (172.7 cm) Weight: 165 lb (74.8 kg) IBW/kg (Calculated) : 68.4  Vital Signs: Temp: 98.1 F (36.7 C) (01/11 0541) Temp Source: Oral (01/11 0541) BP: 152/46 (01/11 0541) Pulse Rate: 59 (01/11 0541)  Labs: Recent Labs    12/15/17 0443 12/15/17 1056 12/16/17 0612 12/17/17 0339  HGB 14.0  --  14.4 14.2  HCT 43.0  --  44.1 43.3  PLT 218  --  218 212  LABPROT 16.6*  --  19.8* 23.5*  INR 1.35  --  1.70 2.12  HEPARINUNFRC 0.22* 0.31 0.36 0.34  CREATININE 0.80  --  0.77  --     Estimated Creatinine Clearance: 80.8 mL/min (by C-G formula based on SCr of 0.77 mg/dL).  Assessment: On warfarin PTA for afib/mech AVR, here with stroke, INR is sub-therapeutic, on heparin drip, heparin level still low after rate adjustment this morning, no issues per RN.   INR still below therapeutic range at 2.1 but trending up nicely after several boosted doses, will repeat 10mg  tonight and hope to return to home dose by tomorrow if INR within goal range. Heparin continues to be at goal this morning. No issues noted, cbc stable.  Goal of Therapy:  INR goal 2.5-3.5 (closer to ~2.5) Heparin level 0.3-0.5 units/ml Monitor platelets by anticoagulation protocol: Yes   Plan:  Will continue heparin for now Continue with 10mg  of warfarin tonight Daily coags  Erin Hearing PharmD., BCPS Clinical Pharmacist Pager [phone] 12/17/2017 8:47 AM

## 2017-12-17 NOTE — Progress Notes (Signed)
PROGRESS NOTE   [name] [name]  SLH:[id number]    DOB: August 22, 1946    DOA: 12/13/2017  PCP: [name] Baton, MD   I have briefly reviewed patients previous medical records in [facility].  Brief Narrative:  72 year old male with PMH of DM 2, CVA, PAD, HTN, HLD, ITP, status post mechanical aortic valve on chronic Coumadin presented with right facial droop, dysarthria in the context of prior (over a prolonged period of time) intermittent right facial numbness and right arm numbness for which he did not seek medical attention. Diagnosed with acute stroke in the context of subtherapeutic INR 1.29. Evaluated under neurology/stroke team guidance. Currently on IV heparin awaiting INR to be therapeutic prior to discharge.   Assessment & Plan:   Active Problems:   Diabetes ([place])   HYPERCHOLESTEROLEMIA   Immune thrombocytopenic purpura ([place])   Essential hypertension   Coronary atherosclerosis   Aortic valve disorder   Transient cerebral ischemia   PVD (peripheral vascular disease) ([facility])   Aortoiliac occlusive disease (HCC)   Cerebral infarction (HCC)   S/P AVR   Renal artery stenosis, native, bilateral (HCC)   Stroke-like symptoms   1. Acute embolic left brain stroke: Suspected due to subtherapeutic INR. Presented with right facial droop, dysarthria and right-sided numbness. Completed stroke workup under neurology guidance. MRI brain showed 1 cm acute infarction affecting left frontoparietal gyrus. LDL 92, A1c 6.8. Carotid Dopplers: No significant bilateral carotid stenosis. 2-D echo: LVEF 60-65 percent. No report of clots on AV. Right sided numbness resolved. Right facial droop and dysarthria continue to improve. Currently on IV heparin bridging and Coumadin per pharmacy. INR almost therapeutic at 2.12 and could possibly be >2.5 tomorrow. 2. S/P mechanical aVR: INR subtherapeutic on admission, unclear etiology. Reports compliance to medications and no recent change in diet. IV heparin  bridging and Coumadin per pharmacy. Stable. 3. Essential hypertension: Allow permissive hypertension for 24-48 hours post stroke, gradually start medications after 48 hours. Currently amlodipine, HCTZ, metoprolol on hold. Started Metoprolol 1/10. 4. Hyperlipidemia: Goal <70. Continue atorvastatin 80 MG daily. Lovaza added. Patient declines Zetia. 5. Type II DM: A1c 6.8, goal <7. Reasonably controlled on SSI in the hospital. Holding glyburide and Invokana, may resume at DC 6. PAD: Follows with outpatient cardiology/Dr. Gwenlyn [name]. 7. CAD status post CABG: Asymptomatic of chest pain. 8. Hypokalemia: Replaced   DVT prophylaxis: IV heparin infusion and Coumadin per pharmacy. Code Status: Full Family Communication: None at bedside Disposition: DC home when INR therapeutic >2.5. Possibly 1/12.   Consultants:  Neurology  Updated patient's primary Cardiologist of patient's admission.  Procedures:  As above  Antimicrobials:  None    Subjective: Right-sided facial weakness and slurred speech continue to improve. No new complaints.  ROS: No chest pain, palpitations or dyspnea.  Objective:  Vitals:   12/17/17 0100 12/17/17 0541 12/17/17 1123 12/17/17 1356  BP: (!) 146/47 (!) 152/46 (!) 164/58 (!) 156/44  Pulse: 65 (!) 59 (!) 58 (!) 59  Resp: 18 18 16 20   Temp: 98.2 F (36.8 C) 98.1 F (36.7 C) 98 F (36.7 C) 97.8 F (36.6 C)  TempSrc: Oral Oral Oral Oral  SpO2: 95% 96% 96% 95%  Weight:      Height:        Examination:  General exam: Pleasant elderly male, sitting up comfortably in chair this morning. Respiratory system: Clear to auscultation. Respiratory effort normal. Stable without change. Cardiovascular system: S1 & S2 heard, RRR. No JVD, murmurs, rubs, gallops or clicks. No pedal  edema. Crisp click of mechanical aortic valve appreciated. Telemetry: Sinus rhythm. Gastrointestinal system: Abdomen is nondistended, soft and nontender. No organomegaly or masses felt. Normal bowel  sounds heard. Central nervous system: Alert and oriented. Right upper motor neuron facial palsy and mild dysarthria, improving. No other cranial nerve deficits. Stable without change. Extremities: Symmetric 5 x 5 power. Skin: No rashes, lesions or ulcers Psychiatry: Judgement and insight appear normal. Mood & affect appropriate.     Data Reviewed: I have personally reviewed following labs and imaging studies  CBC: Recent Labs  Lab 12/13/17 1028 12/13/17 1053 12/14/17 0440 12/15/17 0443 12/16/17 0612 12/17/17 0339  WBC 6.2  --  5.7 5.4 5.9 5.1  NEUTROABS 3.8  --   --   --   --   --   HGB 15.5 16.3 14.6 14.0 14.4 14.2  HCT 47.0 48.0 45.4 43.0 44.1 43.3  MCV 95.5  --  96.6 96.4 96.5 96.7  PLT 221  --  223 218 218 080   Basic Metabolic Panel: Recent Labs  Lab 12/13/17 1028 12/13/17 1053 12/14/17 0440 12/15/17 0443 12/16/17 0612  NA 139 141 139 139 138  K 4.4 4.3 3.7 3.4* 3.5  CL 102 101 104 105 104  CO2 27  --  27 25 25   GLUCOSE 168* 170* 118* 122* 135*  BUN 11 12 13 10 7   CREATININE 0.98 1.00 0.80 0.80 0.77  CALCIUM 9.1  --  8.7* 8.6* 8.8*   Liver Function Tests: Recent Labs  Lab 12/13/17 1028 12/14/17 0440  AST 28 22  ALT 26 22  ALKPHOS 56 49  BILITOT 1.3* 1.0  PROT 7.0 6.0*  ALBUMIN 3.9 3.4*   Coagulation Profile: Recent Labs  Lab 12/13/17 1028 12/14/17 0440 12/15/17 0443 12/16/17 0612 12/17/17 0339  INR 1.26 1.11 1.35 1.70 2.12   HbA1C: No results for input(s): HGBA1C in the last 72 hours. CBG: Recent Labs  Lab 12/16/17 0732 12/16/17 1643 12/16/17 2144 12/17/17 0613 12/17/17 1145  GLUCAP 132* 188* 185* 168* 204*    No results [name] for this or any previous visit (from the past 240 hour(s)).       Radiology Studies: No results [name].      Scheduled Meds: . atorvastatin  80 mg Oral Daily  . insulin aspart  0-9 Units Subcutaneous TID WC  . Melatonin  1 tablet Oral QHS  . metoprolol succinate  50 mg Oral Daily  . omega-3 acid  ethyl esters  1 g Oral BID  . warfarin  10 mg Oral ONCE-1800  . Warfarin - Pharmacist Dosing Inpatient   Does not apply q1800   Continuous Infusions: . heparin 1,400 Units/hr (12/17/17 1010)     LOS: 3 days     [name] Leep, MD, FACP, [facility]. [facility] Pager [phone]  If 7PM-7AM, please contact night-coverage [email or web address] Password [email or web address] 12/17/2017, 5:00 PM

## 2017-12-18 LAB — CBC
HCT: 42.8 % (ref 39.0–52.0)
Hemoglobin: 14.6 g/dL (ref 13.0–17.0)
MCH: 33 pg (ref 26.0–34.0)
MCHC: 34.1 g/dL (ref 30.0–36.0)
MCV: 96.8 fL (ref 78.0–100.0)
Platelets: 207 10*3/uL (ref 150–400)
RBC: 4.42 MIL/uL (ref 4.22–5.81)
RDW: 14.5 % (ref 11.5–15.5)
WBC: 5.4 10*3/uL (ref 4.0–10.5)

## 2017-12-18 LAB — GLUCOSE, CAPILLARY
Glucose-Capillary: 210 mg/dL — ABNORMAL HIGH (ref 65–99)
Glucose-Capillary: 215 mg/dL — ABNORMAL HIGH (ref 65–99)

## 2017-12-18 LAB — PROTIME-INR
INR: 2.51
Prothrombin Time: 26.9 seconds — ABNORMAL HIGH (ref 11.4–15.2)

## 2017-12-18 LAB — HEPARIN LEVEL (UNFRACTIONATED): Heparin Unfractionated: 0.26 IU/mL — ABNORMAL LOW (ref 0.30–0.70)

## 2017-12-18 MED ORDER — WARFARIN SODIUM 7.5 MG PO TABS: 7.5000 mg | ORAL_TABLET | Freq: Once | ORAL | Status: DC

## 2017-12-18 MED ORDER — OMEGA-3-ACID ETHYL ESTERS 1 G PO CAPS: 1.0000 g | ORAL_CAPSULE | Freq: Two times a day (BID) | ORAL | 0 refills | Status: DC

## 2017-12-18 NOTE — Discharge Summary (Signed)
Physician Discharge Summary  [name] [name] RSW:[id number] DOB: 02-Jan-1946  PCP: Shon Baton, MD  Admit date: 12/13/2017 Discharge date: 12/18/2017  Recommendations for Outpatient Follow-up:  1. Dr. Shon Baton, PCP in 1 week. 2. Dr. Antony Contras, Neurology in 6 weeks. Ambulatory Neurology referral sent. 3. [facility]Cou[facility]in clinic on 12/20/17 or 12/21/17 with repeat labs (CBC, PT & INR) for Coumadin dose adjustment as needed. 4. Dr. Jenkins Rouge, Cardiology. 5. Dr. Quay Burow, Cardiology  Home Health: None Equipment/Devices: None    Discharge Condition: Improved and stable.  CODE STATUS: Full.  Diet recommendation: Heart healthy & diabetic diet.  Discharge Diagnoses:  Active Problems:   Diabetes ([place])   HYPERCHOLESTEROLEMIA   Immune thrombocytopenic purpura (HCC)   Essential hypertension   Coronary atherosclerosis   Aortic valve disorder   Transient cerebral ischemia   PVD (peripheral vascular disease) (HCC)   Aortoiliac occlusive disease (HCC)   Cerebral infarction (HCC)   S/P AVR   Renal artery stenosis, native, bilateral (HCC)   Stroke-like symptoms   Brief Summary: 71 year old male with PMH of DM 2, CVA, PAD, HTN, HLD, ITP, status post mechanical aortic valve on chronic Coumadin presented with right facial droop, dysarthria in the context of prior (over a prolonged period of time) intermittent right facial numbness and right arm numbness for which he did not seek medical attention. Diagnosed with acute stroke in the context of subtherapeutic INR 1.29. Evaluated under neurology/stroke team guidance.   Assessment & Plan:   1. Acute embolic left brain stroke: Suspected due to subtherapeutic INR. Presented with right facial droop, dysarthria and right-sided numbness. Completed stroke workup under neurology guidance. MRI brain showed 1 cm acute infarction affecting left frontoparietal gyrus. LDL 92, A1c 6.8. Carotid Dopplers: No significant bilateral carotid  stenosis. 2-D echo: LVEF 60-65 percent. No report of clots on AV. Right sided numbness resolved. Right facial droop and dysarthria continue to improve. He was bridged with IV heparin until INR therapeutic. INR today is 2.51. Continue prior home dose of Coumadin. Advised to closely follow with his Coumadin clinic early next week for further adjustment as needed. 2. S/P mechanical aVR: INR subtherapeutic on admission, unclear etiology. Reports compliance to medications and no recent change in diet. IV heparin bridging and Coumadin per pharmacy. As above. 3. Essential hypertension: Allowed permissive hypertension for 24-48 hours post stroke. Gradually resumed his prior to admission home medications. Continue metoprolol, amlodipine, Mavik and HCTZ. 4. Hyperlipidemia: Goal <70. Continue atorvastatin 80 MG daily. Lovaza added. Patient declines Zetia. 5. Type II DM: A1c 6.8, goal <7. Reasonably controlled on SSI in the hospital. Resume glyburide and Invokana at DC 6. PAD: Follows with outpatient Cardiology/Dr. Gwenlyn Found. 7. CAD status post CABG: Asymptomatic of chest pain. 8. Hypokalemia: Replaced    Consultants:  Neurology  Updated patient's primary Cardiologist of patient's admission.  Procedures:  As above    Discharge Instructions  Discharge Instructions    Activity as tolerated - No restrictions   Complete by:  As directed    Ambulatory referral to Neurology   Complete by:  As directed    An appointment is requested in approximately: 6 weeks Follow up with stroke clinic (Dr Leonie Man preferred, if not available, then consider Caesar Chestnut, [facility] or Jaynee Eagles whoever is available) at [facility] in about 6-8 weeks. Thanks.   Call MD for:   Complete by:  As directed    Strokelike symptoms.   Diet - low sodium heart healthy   Complete by:  As directed  Diet Carb Modified   Complete by:  As directed        Medication List    TAKE these medications   amLODipine 10 MG tablet Commonly known as:   NORVASC Take 10 mg by mouth daily.   atorvastatin 80 MG tablet Commonly known as:  LIPITOR Take 80 mg by mouth daily.   canagliflozin 100 MG Tabs tablet Commonly known as:  INVOKANA Take 100 mg by mouth daily.   glyBURIDE 2.5 MG tablet Commonly known as:  DIABETA Take 5 mg by mouth daily with breakfast.   hydrochlorothiazide 12.5 MG capsule Commonly known as:  MICROZIDE TAKE ONE CAPSULE BY MOUTH EVERY DAY   Melatonin 5 MG Tabs Take 1 tablet by mouth at bedtime.   metoprolol succinate 50 MG 24 hr tablet Commonly known as:  TOPROL-XL Take 1 tablet (50 mg total) by mouth daily. Take with or immediately following a meal.   omega-3 acid ethyl esters 1 g capsule Commonly known as:  LOVAZA Take 1 capsule (1 g total) by mouth 2 (two) times daily.   sildenafil 20 MG tablet Commonly known as:  REVATIO Take 20 mg by mouth as directed.   trandolapril 4 MG tablet Commonly known as:  MAVIK Take 1 tablet (4 mg total) by mouth daily.   warfarin 7.5 MG tablet Commonly known as:  COUMADIN Take as directed. If you are unsure how to take this medication, talk to your nurse or doctor. Original instructions:  TAKE AS DIRECTED BY COUMADIN CLINIC      Follow-up Information    Garvin Fila, MD. Schedule an appointment as soon as possible for a visit in 6 week(s).   Specialties:  Neurology, Radiology Contact information: [address] [place] [address] [phone]        Shon Baton, MD. Schedule an appointment as soon as possible for a visit in 1 week(s).   Specialty:  Internal Medicine Contact information: [place] [address] [phone]        [place]. Schedule an appointment as soon as possible for a visit.   Specialty:  Cardiology Why:  Please go to your Coumadin clinic on 12/20/17 or 12/21/17 to have labs redrawn (CBC, PT & INR).  Contact information: [address], [place]  [place]       Josue Hector, MD. Schedule an appointment as soon as possible for a visit.   Specialty:  Cardiology Contact information: [address] [place] [address] [phone]        Lorretta Harp, MD. Schedule an appointment as soon as possible for a visit.   Specialties:  Cardiology, Radiology Contact information: [address] [place] [address] [phone]          No Known Allergies    Procedures/Studies: Ct Head Wo Contrast  Result Date: 12/13/2017 CLINICAL DATA:  Right-sided numbness since 12/11/2017. Onset of right facial droop 12/12/2017. EXAM: CT HEAD WITHOUT CONTRAST TECHNIQUE: Contiguous axial images were obtained from the base of the skull through the vertex without intravenous contrast. COMPARISON:  Brain MRI and head CT scan 05/05/2013. FINDINGS: Brain: There is cortical atrophy and chronic microvascular ischemic change. No evidence of acute abnormality including hemorrhage, infarct, mass lesion, mass effect, midline shift or abnormal extra axial fluid collection. No hydrocephalus or pneumocephalus. Vascular: Atherosclerosis noted. Skull: Intact. Sinuses/Orbits: Negative. Other: None. IMPRESSION: No acute abnormality. Atrophy and chronic microvascular ischemic  change. Atherosclerosis. Electronically Signed   By: Inge Rise M.D.   On: 12/13/2017 11:12   Mr Brain Wo Contrast  Result Date: 12/13/2017 CLINICAL DATA:  Two day history of right facial weakness and slurred speech. EXAM: MRI HEAD WITHOUT CONTRAST TECHNIQUE: Multiplanar, multiecho pulse sequences of the brain and surrounding structures were obtained without intravenous contrast. COMPARISON:  CT same day.  MRI 05/05/2013. FINDINGS: Brain: Diffusion imaging shows a 1 x 1.5 cm acute infarction on the left probably affecting the precentral gyrus. No other acute infarction. Chronic small-vessel ischemic changes affect the pons. There are a few old  small vessel cerebellar infarctions. Cerebral hemispheres show moderate chronic small-vessel ischemic changes of the deep and subcortical white matter. No large vessel territory infarction. No mass lesion, hemorrhage, hydrocephalus or extra-axial collection. Vascular: Major vessels at the base of the brain show flow. Skull and upper cervical spine: Negative Sinuses/Orbits: Clear/normal Other: None IMPRESSION: 1 cm acute infarction affecting a left frontoparietal gyrus, probably the precentral gyrus. No sign of hemorrhage or swelling. Chronic small-vessel ischemic changes elsewhere throughout the brain, somewhat progressive since 2014. Electronically Signed   By: Nelson Chimes M.D.   On: 12/13/2017 15:16      Subjective: Denies complaints. Facial droop and slurred speech almost resolved. Eager to go home.  Discharge Exam:  Vitals:   12/17/17 2040 12/18/17 0030 12/18/17 0500 12/18/17 0938  BP: (!) 154/44 (!) 138/48 (!) 137/46 (!) 158/44  Pulse: 66 60 62 66  Resp: 20 20 20 20   Temp: 98.2 F (36.8 C) 98.4 F (36.9 C) 98.3 F (36.8 C) 98.3 F (36.8 C)  TempSrc: Oral Oral Oral Oral  SpO2: 95% 94% 95% 96%  Weight:      Height:        General exam: Pleasant elderly male, sitting up comfortably in chair this morning. Respiratory system: Clear to auscultation. Respiratory effort normal.  Cardiovascular system: S1 & S2 heard, RRR. No JVD, murmurs, rubs, gallops or clicks. No pedal edema. Crisp click of mechanical aortic valve appreciated.  Gastrointestinal system: Abdomen is nondistended, soft and nontender. No organomegaly or masses felt. Normal bowel sounds heard. Central nervous system: Alert and oriented. Right upper motor neuron facial palsy and mild dysarthria, improving. No other cranial nerve deficits.  Extremities: Symmetric 5 x 5 power. Skin: No rashes, lesions or ulcers Psychiatry: Judgement and insight appear normal. Mood & affect appropriate.       The results of significant  diagnostics from this hospitalization (including imaging, microbiology, ancillary and laboratory) are listed below for reference.      Labs: CBC: Recent Labs  Lab 12/13/17 1028  12/14/17 0440 12/15/17 0443 12/16/17 0612 12/17/17 0339 12/18/17 0414  WBC 6.2  --  5.7 5.4 5.9 5.1 5.4  NEUTROABS 3.8  --   --   --   --   --   --   HGB 15.5   < > 14.6 14.0 14.4 14.2 14.6  HCT 47.0   < > 45.4 43.0 44.1 43.3 42.8  MCV 95.5  --  96.6 96.4 96.5 96.7 96.8  PLT 221  --  223 218 218 212 207   < > = values in this interval not displayed.   Basic Metabolic Panel: Recent Labs  Lab 12/13/17 1028 12/13/17 1053 12/14/17 0440 12/15/17 0443 12/16/17 0612  NA 139 141 139 139 138  K 4.4 4.3 3.7 3.4* 3.5  CL 102 101 104 105 104  CO2 27  --  27 25 25  GLUCOSE 168* 170* 118* 122* 135*  BUN 11 12 13 10 7   CREATININE 0.98 1.00 0.80 0.80 0.77  CALCIUM 9.1  --  8.7* 8.6* 8.8*   Liver Function Tests: Recent Labs  Lab 12/13/17 1028 12/14/17 0440  AST 28 22  ALT 26 22  ALKPHOS 56 49  BILITOT 1.3* 1.0  PROT 7.0 6.0*  ALBUMIN 3.9 3.4*   CBG: Recent Labs  Lab 12/17/17 1145 12/17/17 1717 12/17/17 2213 12/18/17 0610 12/18/17 1110  GLUCAP 204* 217* 242* 210* 215*   Urinalysis    Component Value Date/Time   COLORURINE STRAW (A) 12/13/2017 1551   APPEARANCEUR CLEAR 12/13/2017 1551   LABSPEC 1.015 12/13/2017 1551   PHURINE 7.0 12/13/2017 1551   GLUCOSEU >=500 (A) 12/13/2017 1551   HGBUR NEGATIVE 12/13/2017 1551   BILIRUBINUR NEGATIVE 12/13/2017 1551   KETONESUR 5 (A) 12/13/2017 1551   PROTEINUR NEGATIVE 12/13/2017 1551   UROBILINOGEN 0.2 01/15/2012 1415   NITRITE NEGATIVE 12/13/2017 1551   LEUKOCYTESUR NEGATIVE 12/13/2017 1551      Time coordinating discharge: Over 30 minutes  SIGNED:  Vernell Leep, MD, FACP, [facility]. [facility] Pager [phone]  If 7PM-7AM, please contact night-coverage [email or web address] Password [facility] 12/18/2017, 12:37 PM

## 2017-12-18 NOTE — Progress Notes (Addendum)
ANTICOAGULATION CONSULT NOTE - Follow Up Consult  Pharmacy Consult for warfarin  Indication: atrial fibrillation, stroke and mechanical AVR  No Known Allergies  Patient Measurements: Height: 5\' 8"  (172.7 cm) Weight: 165 lb (74.8 kg) IBW/kg (Calculated) : 68.4  Vital Signs: Temp: 98.3 F (36.8 C) (01/12 0500) Temp Source: Oral (01/12 0500) BP: 137/46 (01/12 0500) Pulse Rate: 62 (01/12 0500)  Labs: Recent Labs    12/16/17 0612 12/17/17 0339 12/18/17 0414  HGB 14.4 14.2 14.6  HCT 44.1 43.3 42.8  PLT 218 212 207  LABPROT 19.8* 23.5* 26.9*  INR 1.70 2.12 2.51  HEPARINUNFRC 0.36 0.34 0.26*  CREATININE 0.77  --   --     Estimated Creatinine Clearance: 80.8 mL/min (by C-G formula based on SCr of 0.77 mg/dL).  Assessment: On warfarin PTA for afib/mech AVR, here with stroke. AM INR therapeutic at 2.51 and per discussion with MD, okay to discontinue heparin and continue on warfarin monotherapy. No issues noted, cbc stable. Per MD plan to discharge patient today, and patient is to have a follow up appointment on Monday or Tuesday to check INR.   PTA warfarin dose listed as 7.5mg  daily except 11.25mg  on Sunday  (last Coumadin clinic visit)  Goal of Therapy:  INR goal 2.5-3.5 (closer to ~2.5) Monitor platelets by anticoagulation protocol: Yes   Plan:  Discontinued heparin Warfarin 7.5 mg PO daily, except 11.25 mg on Sundays for outpatient setting. F/u INR, s/sx bleeding, PO intake, DDI at clinic  Nida Boatman, PharmD PGY1 Acute Care Pharmacy Resident Pager: [phone] 12/18/2017 8:08 AM

## 2017-12-18 NOTE — Discharge Instructions (Signed)

## 2017-12-18 NOTE — Progress Notes (Signed)
Pt discharge education and instructions completed with pt. Pt IV and telemetry removed; pt discharge home with family to transport him home. Pt to pick up electronically sent prescription from preferred pharmacy on file. Pt transported off unit via wheelchair with belongings and family to the side. Delia Heady RN

## 2017-12-20 ENCOUNTER — Encounter (HOSPITAL_COMMUNITY): Payer: Self-pay

## 2017-12-20 ENCOUNTER — Observation Stay (HOSPITAL_COMMUNITY)
Admission: EM | Admit: 2017-12-20 | Discharge: 2017-12-21 | Disposition: A | Discharge status: Home / Self Care | Payer: Medicare Other | Attending: Internal Medicine | Admitting: Internal Medicine

## 2017-12-20 ENCOUNTER — Emergency Department (HOSPITAL_COMMUNITY): Payer: Medicare Other

## 2017-12-20 ENCOUNTER — Other Ambulatory Visit: Payer: Self-pay

## 2017-12-20 DIAGNOSIS — Z951 Presence of aortocoronary bypass graft: Secondary | ICD-10-CM | POA: Insufficient documentation

## 2017-12-20 DIAGNOSIS — Z7984 Long term (current) use of oral hypoglycemic drugs: Secondary | ICD-10-CM | POA: Insufficient documentation

## 2017-12-20 DIAGNOSIS — R791 Abnormal coagulation profile: Secondary | ICD-10-CM | POA: Diagnosis not present

## 2017-12-20 DIAGNOSIS — R200 Anesthesia of skin: Secondary | ICD-10-CM | POA: Diagnosis not present

## 2017-12-20 DIAGNOSIS — R51 Headache: Secondary | ICD-10-CM | POA: Diagnosis not present

## 2017-12-20 DIAGNOSIS — G459 Transient cerebral ischemic attack, unspecified: Principal | ICD-10-CM | POA: Diagnosis present

## 2017-12-20 DIAGNOSIS — I6389 Other cerebral infarction: Secondary | ICD-10-CM | POA: Diagnosis not present

## 2017-12-20 DIAGNOSIS — I69992 Facial weakness following unspecified cerebrovascular disease: Secondary | ICD-10-CM | POA: Diagnosis not present

## 2017-12-20 DIAGNOSIS — I10 Essential (primary) hypertension: Secondary | ICD-10-CM | POA: Diagnosis not present

## 2017-12-20 DIAGNOSIS — Z5181 Encounter for therapeutic drug level monitoring: Secondary | ICD-10-CM | POA: Diagnosis not present

## 2017-12-20 DIAGNOSIS — Z79899 Other long term (current) drug therapy: Secondary | ICD-10-CM | POA: Diagnosis not present

## 2017-12-20 DIAGNOSIS — R202 Paresthesia of skin: Secondary | ICD-10-CM | POA: Diagnosis not present

## 2017-12-20 DIAGNOSIS — Z7901 Long term (current) use of anticoagulants: Secondary | ICD-10-CM | POA: Insufficient documentation

## 2017-12-20 DIAGNOSIS — Z87891 Personal history of nicotine dependence: Secondary | ICD-10-CM | POA: Insufficient documentation

## 2017-12-20 DIAGNOSIS — Z952 Presence of prosthetic heart valve: Secondary | ICD-10-CM

## 2017-12-20 DIAGNOSIS — R2990 Unspecified symptoms and signs involving the nervous system: Secondary | ICD-10-CM | POA: Diagnosis not present

## 2017-12-20 DIAGNOSIS — E119 Type 2 diabetes mellitus without complications: Secondary | ICD-10-CM | POA: Diagnosis not present

## 2017-12-20 DIAGNOSIS — I2510 Atherosclerotic heart disease of native coronary artery without angina pectoris: Secondary | ICD-10-CM | POA: Diagnosis not present

## 2017-12-20 DIAGNOSIS — I6789 Other cerebrovascular disease: Secondary | ICD-10-CM | POA: Diagnosis not present

## 2017-12-20 DIAGNOSIS — Z7982 Long term (current) use of aspirin: Secondary | ICD-10-CM | POA: Insufficient documentation

## 2017-12-20 DIAGNOSIS — R410 Disorientation, unspecified: Secondary | ICD-10-CM | POA: Diagnosis not present

## 2017-12-20 LAB — URINALYSIS, ROUTINE W REFLEX MICROSCOPIC
BACTERIA UA: NONE SEEN
Bilirubin Urine: NEGATIVE
Glucose, UA: >=500 mg/dL — AB
Hgb urine dipstick: NEGATIVE
KETONES UR: 5 mg/dL — AB
LEUKOCYTES UA: NEGATIVE
Nitrite: NEGATIVE
PH: 7 (ref 5.0–8.0)
PROTEIN: NEGATIVE mg/dL
SQUAMOUS EPITHELIAL / LPF: NONE SEEN
Specific Gravity, Urine: 1.02 (ref 1.005–1.030)
WBC, UA: NONE SEEN WBC/hpf (ref 0–5)

## 2017-12-20 LAB — CBC WITH DIFFERENTIAL/PLATELET
BASOS ABS: 0 10*3/uL (ref 0.0–0.1)
Basophils Relative: 0 %
Eosinophils Absolute: 0.1 10*3/uL (ref 0.0–0.7)
Eosinophils Relative: 1 %
HEMATOCRIT: 48 % (ref 39.0–52.0)
HEMOGLOBIN: 16.4 g/dL (ref 13.0–17.0)
LYMPHS PCT: 10 %
Lymphs Abs: 0.9 10*3/uL (ref 0.7–4.0)
MCH: 32.9 pg (ref 26.0–34.0)
MCHC: 34.2 g/dL (ref 30.0–36.0)
MCV: 96.2 fL (ref 78.0–100.0)
Monocytes Absolute: 1.1 10*3/uL — ABNORMAL HIGH (ref 0.1–1.0)
Monocytes Relative: 13 %
NEUTROS ABS: 6.7 10*3/uL (ref 1.7–7.7)
NEUTROS PCT: 76 %
Platelets: 226 10*3/uL (ref 150–400)
RBC: 4.99 MIL/uL (ref 4.22–5.81)
RDW: 14.4 % (ref 11.5–15.5)
WBC: 8.9 10*3/uL (ref 4.0–10.5)

## 2017-12-20 LAB — PROTIME-INR
INR: 1.95
PROTHROMBIN TIME: 22.1 s — AB (ref 11.4–15.2)

## 2017-12-20 LAB — COMPREHENSIVE METABOLIC PANEL
ALT: 80 U/L — AB (ref 17–63)
AST: 60 U/L — AB (ref 15–41)
Albumin: 4 g/dL (ref 3.5–5.0)
Alkaline Phosphatase: 70 U/L (ref 38–126)
Anion gap: 10 (ref 5–15)
BUN: 20 mg/dL (ref 6–20)
CHLORIDE: 102 mmol/L (ref 101–111)
CO2: 26 mmol/L (ref 22–32)
CREATININE: 1.11 mg/dL (ref 0.61–1.24)
Calcium: 9.7 mg/dL (ref 8.9–10.3)
GFR calc Af Amer: >60 mL/min (ref >60)
Glucose, Bld: 210 mg/dL — ABNORMAL HIGH (ref 65–99)
Potassium: 4.3 mmol/L (ref 3.5–5.1)
Sodium: 138 mmol/L (ref 135–145)
Total Bilirubin: 1.1 mg/dL (ref 0.3–1.2)
Total Protein: 7.4 g/dL (ref 6.5–8.1)

## 2017-12-20 NOTE — H&P (Signed)
History and Physical    [name] [name] VZD:[id number] DOB: 1946/11/09 DOA: 12/20/2017  Referring MD/NP/PA: Dr Sherry Ruffing  PCP: Shon Baton, MD   Outpatient Specialists:  Patient coming from: Home  Chief Complaint: Dizziness and headaches  HPI: [name] [name] is a 72 y.o. male with medical history significant of mechanical valve was recent acute CVA who has had multiple episode of subtherapeutic INR and warfarin presenting with sudden onset of dizziness and headaches while driving today. Patient's INR was therapeutic at 2.5 only 3 days ago. On arrival in the ER however it is down to 1.9. He has new neurologic symptoms but outside the window is for possible TPA this size patient is not a candidate for TPA. He has been evaluated by neurology. He is having new slurred speech and left facial droop. Patient's previous stroke have been attributed to subtherapeutic INR. He denied taking any vegetables. Patient has been taking 7.5 mg of warfarin a day and he has been consistent. No other new medications. His initial head CT and this shows evolution of previous CVA. He has no other focal neurologic weakness or findings.   ED Course: Patient's head CT without contrast showed no new findings but evolution or stroke, PT INR showed subtherapeutic levels with INR 1.9.  Review of Systems: As per HPI otherwise 10 point review of systems negative.    Past Medical History:  Diagnosis Date  . Adenomatous colon polyp 09/1997  . Anemia   . Aortic stenosis    s/p [facility] mechanical AVR - Chronic Coumadin  . Blood transfusion    "related to ITP"  . Coronary artery disease    s/p cabg x 3 11/2003: lima-lad, seq vg to rpda and rpl  . Diverticulitis of colon   . Heart murmur   . Hyperlipidemia   . Hypertension   . ITP (idiopathic thrombocytopenic purpura)   . Peripheral arterial disease ([place])    a. history of aortobifemoral bypass grafting by Dr. Sherren Mocha early b. LE angiography 04/22/2015 patent  aortobifem graft, DES to R SFA  . Peripheral vascular disease ([place])    s/p Left external Iliac Artery stenting and subsequent left femoral endarterectomy 02/2011- post op course complicated by wound infxn req I&D 03/2011  . Renal artery stenosis, native, bilateral ([facility])    a. bilateral renal artery stenosis by recent duplex ultrasound b. L renal artery stent 02/2015, R renal artery patent on angiogram  . Stroke ([place])   . TIA (transient ischemic attack) ~ 2013  . Type II diabetes mellitus ([place])     Past Surgical History:  Procedure Laterality Date  . ABDOMINAL AORTAGRAM N/A 12/16/2011   Procedure: ABDOMINAL Maxcine Ham;  Surgeon: Sherren Mocha, MD;  Location: [facility] CATH LAB;  Service: Cardiovascular;  Laterality: N/A;  . ANGIOPLASTY / STENTING ILIAC     Left external Iliac Artery  . AORTA - BILATERAL FEMORAL ARTERY BYPASS GRAFT  01/18/2012   Procedure: AORTA BIFEMORAL BYPASS GRAFT;  Surgeon: Curt Jews, MD;  Location: [place];  Service: Vascular;  Laterality: N/A;  . AORTIC VALVE REPLACEMENT  ~ 2004  . CARDIAC CATHETERIZATION  11/2003   /pt report 10/01/2016  . CARDIAC VALVE REPLACEMENT  11/2003   aortic  . CATARACT EXTRACTION W/ INTRAOCULAR LENS  IMPLANT, BILATERAL Bilateral   . CORONARY ARTERY BYPASS GRAFT  11/2003   Archie Endo 04/21/2011  . LOWER EXTREMITY ANGIOGRAM N/A 02/21/2015   Procedure: LOWER EXTREMITY ANGIOGRAM;  Surgeon: Lorretta Harp, MD;  Location: [facility] CATH LAB;  Service:  Cardiovascular;  Laterality: N/A;  . PERIPHERAL VASCULAR CATHETERIZATION N/A 04/22/2015   Procedure: Lower Extremity Angiography;  Surgeon: Lorretta Harp, MD;  Location: [place] CV LAB;  Service: Cardiovascular;  Laterality: N/A;  . PERIPHERAL VASCULAR CATHETERIZATION N/A 08/24/2016   Procedure: Lower Extremity Angiography;  Surgeon: Lorretta Harp, MD;  Location: [place] CV LAB;  Service: Cardiovascular;  Laterality: N/A;  . PERIPHERAL VASCULAR CATHETERIZATION Right 10/01/2016   Procedure: Peripheral  Vascular Intervention - STENT;  Surgeon: Lorretta Harp, MD;  Location: [place] CV LAB;  Service: Cardiovascular;  Laterality: Right;  Prox and MID SFA   . RENAL ANGIOGRAM N/A 02/21/2015   Procedure: RENAL ANGIOGRAM;  Surgeon: Lorretta Harp, MD;  Location: [facility] CATH LAB;  Service: Cardiovascular;  Laterality: Bilateral; 6 mm x 12 mm long Herculink balloon expandable stent to the left renal artery  . RENAL ARTERY STENT Left 04/22/2015   dr berry  . SPLENECTOMY  02/2003   Archie Endo 04/21/2011  . TONSILLECTOMY  ~ 1952     reports that he quit smoking about 24 years ago. His smoking use included cigarettes and cigars. He quit after 30.00 years of use. he has never used smokeless tobacco. He reports that he drinks about 25.2 oz of alcohol per week. He reports that he does not use drugs.  No Known Allergies  Family History  Problem Relation Age of Onset  . Coronary artery disease Mother        bypass surgery - deceased  . Heart disease Father        murmur, valve replacement - deceased  . Breast cancer Sister   . Diabetes Unknown        grandmother  . Diabetes Paternal Grandmother   . Diabetes Paternal Aunt   . Colon cancer Neg Hx    Unacceptable: Noncontributory, unremarkable, or negative. Acceptable: Family history reviewed and not pertinent (If you reviewed it)  Prior to Admission medications   Medication Sig Start Date End Date Taking? Authorizing Provider  amLODipine (NORVASC) 10 MG tablet Take 10 mg by mouth daily. 02/11/15  Yes [provider]  aspirin EC 81 MG tablet Take 81 mg by mouth daily.   Yes [provider]  atorvastatin (LIPITOR) 80 MG tablet Take 80 mg by mouth daily.    Yes [provider]  canagliflozin (INVOKANA) 100 MG TABS tablet Take 100 mg by mouth daily.   Yes [provider]  glyBURIDE (DIABETA) 2.5 MG tablet Take 5 mg by mouth 2 (two) times daily with a meal.    Yes [provider]  hydrochlorothiazide (MICROZIDE)  12.5 MG capsule TAKE ONE CAPSULE BY MOUTH EVERY DAY Patient taking differently: TAKE ONE CAPSULE (12.5 MG)  BY MOUTH EVERY DAY 01/25/17  Yes Lorretta Harp, MD  metoprolol succinate (TOPROL-XL) 50 MG 24 hr tablet Take 1 tablet (50 mg total) by mouth daily. Take with or immediately following a meal. 02/16/17  Yes Lorretta Harp, MD  sildenafil (REVATIO) 20 MG tablet Take 100 mg by mouth daily as needed (erectile dysfunction).    Yes [provider]  trandolapril (MAVIK) 4 MG tablet Take 1 tablet (4 mg total) by mouth daily. 08/16/17  Yes Lorretta Harp, MD  warfarin (COUMADIN) 7.5 MG tablet TAKE AS DIRECTED BY COUMADIN CLINIC Patient taking differently: TAKE ONE TABLET (7.5 MG) BY MOUTH EVERY MORNING OR  AS DIRECTED BY COUMADIN CLINIC 08/30/17  Yes Josue Hector, MD  omega-3 acid ethyl esters (LOVAZA)  1 g capsule Take 1 capsule (1 g total) by mouth 2 (two) times daily. 12/18/17   [place] Jansky, MD    Physical Exam: Vitals:   12/20/17 2145 12/20/17 2200 12/20/17 2207 12/20/17 2230  BP: (!) 145/48 (!) 131/52    Pulse: 73 77  74  Resp: 16 13    Temp:   99 F (37.2 C)   TempSrc:   Oral   SpO2: (!) 88% (!) 82%  95%      Constitutional: NAD, calm, comfortable Vitals:   12/20/17 2145 12/20/17 2200 12/20/17 2207 12/20/17 2230  BP: (!) 145/48 (!) 131/52    Pulse: 73 77  74  Resp: 16 13    Temp:   99 F (37.2 C)   TempSrc:   Oral   SpO2: (!) 88% (!) 82%  95%   Eyes: PERRL, lids and conjunctivae normal ENMT: Mucous membranes are moist. Posterior pharynx clear of any exudate or lesions.Normal dentition.  Neck: normal, supple, no masses, no thyromegaly Respiratory: clear to auscultation bilaterally, no wheezing, no crackles. Normal respiratory effort. No accessory muscle use.  Cardiovascular: Regular rate and rhythm, no murmurs / rubs / gallops. No extremity edema. 2+ pedal pulses. No carotid bruits.  Abdomen: no tenderness, no masses palpated. No hepatosplenomegaly.  Bowel sounds positive.  Musculoskeletal: no clubbing / cyanosis. No joint deformity upper and lower extremities. Good ROM, no contractures. Normal muscle tone.  Skin: no rashes, lesions, ulcers. No induration Neurologic: CN 2-12 grossly intact. Sensation intact, DTR normal. Strength 5/5 in all 4.  Psychiatric: Normal judgment and insight. Alert and oriented x 3. Normal mood.   (Anything < 9 systems with 2 bullets each down codes to level 1) (If patient refuses exam can't bill higher level) (Make sure to document decubitus ulcers present on admission -- if possible -- and whether patient has chronic indwelling catheter at time of admission)  Labs on Admission: I have personally reviewed following labs and imaging studies  CBC: Recent Labs  Lab 12/15/17 0443 12/16/17 0612 12/17/17 0339 12/18/17 0414 12/20/17 1744  WBC 5.4 5.9 5.1 5.4 8.9  NEUTROABS  --   --   --   --  6.7  HGB 14.0 14.4 14.2 14.6 16.4  HCT 43.0 44.1 43.3 42.8 48.0  MCV 96.4 96.5 96.7 96.8 96.2  PLT 218 218 212 207 702   Basic Metabolic Panel: Recent Labs  Lab 12/14/17 0440 12/15/17 0443 12/16/17 0612 12/20/17 1744  NA 139 139 138 138  K 3.7 3.4* 3.5 4.3  CL 104 105 104 102  CO2 27 25 25 26   GLUCOSE 118* 122* 135* 210*  BUN 13 10 7 20   CREATININE 0.80 0.80 0.77 1.11  CALCIUM 8.7* 8.6* 8.8* 9.7   GFR: Estimated Creatinine Clearance: 58.2 mL/min (by C-G formula based on SCr of 1.11 mg/dL). Liver Function Tests: Recent Labs  Lab 12/14/17 0440 12/20/17 1744  AST 22 60*  ALT 22 80*  ALKPHOS 49 70  BILITOT 1.0 1.1  PROT 6.0* 7.4  ALBUMIN 3.4* 4.0   No results for input(s): LIPASE, AMYLASE in the last 168 hours. No results for input(s): AMMONIA in the last 168 hours. Coagulation Profile: Recent Labs  Lab 12/15/17 0443 12/16/17 0612 12/17/17 0339 12/18/17 0414 12/20/17 1744  INR 1.35 1.70 2.12 2.51 1.95   Cardiac Enzymes: No results for input(s): CKTOTAL, CKMB, CKMBINDEX, TROPONINI in the  last 168 hours. BNP (last 3 results) No results for input(s): PROBNP in the last 8760 hours.  HbA1C: No results for input(s): HGBA1C in the last 72 hours. CBG: Recent Labs  Lab 12/17/17 1145 12/17/17 1717 12/17/17 2213 12/18/17 0610 12/18/17 1110  GLUCAP 204* 217* 242* 210* 215*   Lipid Profile: No results for input(s): CHOL, HDL, LDLCALC, TRIG, CHOLHDL, LDLDIRECT in the last 72 hours. Thyroid Function Tests: No results for input(s): TSH, T4TOTAL, FREET4, T3FREE, THYROIDAB in the last 72 hours. Anemia Panel: No results for input(s): VITAMINB12, FOLATE, FERRITIN, TIBC, IRON, RETICCTPCT in the last 72 hours. Urine analysis:    Component Value Date/Time   COLORURINE STRAW (A) 12/20/2017 1924   APPEARANCEUR CLEAR 12/20/2017 1924   LABSPEC 1.020 12/20/2017 1924   PHURINE 7.0 12/20/2017 1924   GLUCOSEU >=500 (A) 12/20/2017 1924   HGBUR NEGATIVE 12/20/2017 1924   BILIRUBINUR NEGATIVE 12/20/2017 1924   KETONESUR 5 (A) 12/20/2017 1924   PROTEINUR NEGATIVE 12/20/2017 1924   UROBILINOGEN 0.2 01/15/2012 1415   NITRITE NEGATIVE 12/20/2017 1924   LEUKOCYTESUR NEGATIVE 12/20/2017 1924   Sepsis Labs: @LABRCNTIP (procalcitonin:4,lacticidven:4) )No results found for this or any previous visit (from the past 240 hour(s)).   Radiological Exams on Admission: Ct Head Wo Contrast  Result Date: 12/20/2017 CLINICAL DATA:  Patient with bilateral temporal headache. Recent CVA. On blood thinners. EXAM: CT HEAD WITHOUT CONTRAST TECHNIQUE: Contiguous axial images were obtained from the base of the skull through the vertex without intravenous contrast. COMPARISON:  Brain CT 12/13/2017. FINDINGS: Brain: Ventricles and sulci are prominent compatible with atrophy. Evolving infarct within the left frontoparietal region. No evidence for acute intracranial hemorrhage, mass lesion or mass-effect. Periventricular and subcortical white matter hypodensity compatible with chronic microvascular ischemic changes.  Vascular: Internal carotid arterial vascular calcifications. Skull: Intact. Sinuses/Orbits: Mild mucosal thickening frontal sinus. Mastoid air cells are well aerated. Cerumen within the external auditory canals. Other: None. IMPRESSION: Evolving infarct within the left frontoparietal region. Atrophy and chronic microvascular ischemic changes Electronically Signed   By: Lovey Newcomer M.D.   On: 12/20/2017 18:12   Dg Chest Portable 1 View  Result Date: 12/20/2017 CLINICAL DATA:  Patient with confusion. EXAM: PORTABLE CHEST 1 VIEW COMPARISON:  Chest radiograph 08/06/2016. FINDINGS: Monitoring leads overlie the patient. Stable cardiac and mediastinal contours. Aortic atherosclerosis. No consolidative pulmonary opacities. No pleural effusion or pneumothorax. IMPRESSION: No acute cardiopulmonary process. Electronically Signed   By: Lovey Newcomer M.D.   On: 12/20/2017 19:45    EKG: Independently reviewed.   Assessment/Plan Principal Problem:   Stroke-like symptoms Active Problems:   Diabetes ([place])   Hypertension   S/P AVR   Subtherapeutic international normalized ratio (INR)   TIA (transient ischemic attack)    #1 new stroke- like symptoms: This most likely reflects new CVA secondary to patient's low INR of 1.9. His target should be 2.5-3.5 with mechanical valve. She will be admitted an MRI will be checked. Neurology has seen patient and follow recommendations. He had recent full workup for stroke including echocardiogram and carotids so we will not repeat that. Continue full anticoagulation and recommendations per neurology  #2 subtherapeutic INR: Patient will be placed on both Lovenox and warfarin. We will consult pharmacy for further dosing  #3 diabetes: Blood sugar appears controlled. Continue home regimen with sliding scale insulin  #4 hypertension: Continue home regimen.   DVT prophylaxis: Warfarin/Lovenox   Code Status: Full   Family Communication: No family around   Disposition Plan:  Home   Consults called: Neurology, [place]  Admission status: Observation   Severity of Illness: The appropriate patient status for  this patient is OBSERVATION. Observation status is judged to be reasonable and necessary in order to provide the required intensity of service to ensure the patient's safety. The patient's presenting symptoms, physical exam findings, and initial radiographic and laboratory data in the context of their medical condition is felt to place them at decreased risk for further clinical deterioration. Furthermore, it is anticipated that the patient will be medically stable for discharge from the hospital within 2 midnights of admission. The following factors support the patient status of observation.   " The patient's presenting symptoms include Facial cellulitis. " The physical exam findings include Facial droop. " The initial radiographic and laboratory data are normal Head CT.     Barbette Merino MD [facility] Pager [phone]  If 7PM-7AM, please contact night-coverage [email or web address] Password [facility]  12/20/2017, 11:49 PM

## 2017-12-20 NOTE — ED Provider Notes (Signed)
[facility] EMERGENCY DEPARTMENT Provider Note   CSN: [id number] Arrival date & time: 12/20/17  1649     History   Chief Complaint Chief Complaint  Patient presents with  . Hypertension  . Dizziness    HPI [name] [name] is a 72 y.o. male.  The history is provided by the patient and medical records. No language interpreter was used.  Neurologic Problem  This is a new problem. The current episode started 3 to 5 hours ago. The problem occurs constantly. The problem has been rapidly improving. Associated symptoms include headaches. Pertinent negatives include no chest pain, no abdominal pain and no shortness of breath. Nothing aggravates the symptoms. Nothing relieves the symptoms. He has tried nothing for the symptoms. The treatment provided no relief.    Past Medical History:  Diagnosis Date  . Adenomatous colon polyp 09/1997  . Anemia   . Aortic stenosis    s/p [facility] mechanical AVR - Chronic Coumadin  . Blood transfusion    "related to ITP"  . Coronary artery disease    s/p cabg x 3 11/2003: lima-lad, seq vg to rpda and rpl  . Diverticulitis of colon   . Heart murmur   . Hyperlipidemia   . Hypertension   . ITP (idiopathic thrombocytopenic purpura)   . Peripheral arterial disease ([place])    a. history of aortobifemoral bypass grafting by Dr. Sherren Mocha early b. LE angiography 04/22/2015 patent aortobifem graft, DES to R SFA  . Peripheral vascular disease ([place])    s/p Left external Iliac Artery stenting and subsequent left femoral endarterectomy 02/2011- post op course complicated by wound infxn req I&D 03/2011  . Renal artery stenosis, native, bilateral (HCC)    a. bilateral renal artery stenosis by recent duplex ultrasound b. L renal artery stent 02/2015, R renal artery patent on angiogram  . Stroke ([place])   . TIA (transient ischemic attack) ~ 2013  . Type II diabetes mellitus [facility])     Patient Active Problem List   Diagnosis Date Noted  .  Stroke-like symptoms 12/14/2017  . Claudication ([place]) 10/01/2016  . Renal artery arteriosclerosis ([place]) 02/22/2015  . Renal artery stenosis ([place]) 02/21/2015  . Renal artery stenosis, native, bilateral ([place]) 02/08/2015  . S/P AVR 07/20/2014  . Encounter for therapeutic drug monitoring 03/13/2014  . Cerebral infarction ([place]) 05/05/2013  . Aortoiliac occlusive disease ([place]) 01/10/2013  . PVD (peripheral vascular disease) with claudication ([place]) 02/02/2012  . Atherosclerosis of native arteries of extremity with intermittent claudication ([place]) 01/12/2012  . Coronary artery disease   . Aortic stenosis   . Peripheral vascular disease ([place])   . Hypertension   . Hyperlipidemia   . ITP (idiopathic thrombocytopenic purpura)   . Coronary atherosclerosis 10/21/2010  . Transient cerebral ischemia 10/10/2009  . Diabetes ([place]) 10/22/2008  . HYPERCHOLESTEROLEMIA 10/22/2008  . Immune thrombocytopenic purpura ([place]) 10/22/2008  . Essential hypertension 10/22/2008  . Aortic valve disorder 10/22/2008  . PVD (peripheral vascular disease) ([place]) 10/22/2008  . DIVERTICULITIS, COLON 10/22/2008  . COLONIC POLYPS, HX OF 10/22/2008    Past Surgical History:  Procedure Laterality Date  . ABDOMINAL AORTAGRAM N/A 12/16/2011   Procedure: ABDOMINAL Maxcine Ham;  Surgeon: Sherren Mocha, MD;  Location: [facility] CATH LAB;  Service: Cardiovascular;  Laterality: N/A;  . ANGIOPLASTY / STENTING ILIAC     Left external Iliac Artery  . AORTA - BILATERAL FEMORAL ARTERY BYPASS GRAFT  01/18/2012   Procedure: AORTA BIFEMORAL BYPASS GRAFT;  Surgeon: Curt Jews, MD;  Location:  [facility] OR;  Service: Vascular;  Laterality: N/A;  . AORTIC VALVE REPLACEMENT  ~ 2004  . CARDIAC CATHETERIZATION  11/2003   /pt report 10/01/2016  . CARDIAC VALVE REPLACEMENT  11/2003   aortic  . CATARACT EXTRACTION W/ INTRAOCULAR LENS  IMPLANT, BILATERAL Bilateral   . CORONARY ARTERY BYPASS GRAFT  11/2003   Archie Endo 04/21/2011  . LOWER EXTREMITY ANGIOGRAM N/A  02/21/2015   Procedure: LOWER EXTREMITY ANGIOGRAM;  Surgeon: Lorretta Harp, MD;  Location: [facility] CATH LAB;  Service: Cardiovascular;  Laterality: N/A;  . PERIPHERAL VASCULAR CATHETERIZATION N/A 04/22/2015   Procedure: Lower Extremity Angiography;  Surgeon: Lorretta Harp, MD;  Location: [facility] CV LAB;  Service: Cardiovascular;  Laterality: N/A;  . PERIPHERAL VASCULAR CATHETERIZATION N/A 08/24/2016   Procedure: Lower Extremity Angiography;  Surgeon: Lorretta Harp, MD;  Location: [facility] CV LAB;  Service: Cardiovascular;  Laterality: N/A;  . PERIPHERAL VASCULAR CATHETERIZATION Right 10/01/2016   Procedure: Peripheral Vascular Intervention - STENT;  Surgeon: Lorretta Harp, MD;  Location: [facility] CV LAB;  Service: Cardiovascular;  Laterality: Right;  Prox and MID SFA   . RENAL ANGIOGRAM N/A 02/21/2015   Procedure: RENAL ANGIOGRAM;  Surgeon: Lorretta Harp, MD;  Location[facility] CATH LAB;  Service: Cardiovascular;  Laterality: Bilateral; 6 mm x 12 mm long Herculink balloon expandable stent to the left renal artery  . RENAL ARTERY STENT Left 04/22/2015   dr berry  . SPLENECTOMY  02/2003   Archie Endo 04/21/2011  . TONSILLECTOMY  ~ 1952       Home Medications    Prior to Admission medications   Medication Sig Start Date End Date Taking? Authorizing Provider  amLODipine (NORVASC) 10 MG tablet Take 10 mg by mouth daily. 02/11/15   [provider]  atorvastatin (LIPITOR) 80 MG tablet Take 80 mg by mouth daily.     [provider]  canagliflozin (INVOKANA) 100 MG TABS tablet Take 100 mg by mouth daily.    [provider]  glyBURIDE (DIABETA) 2.5 MG tablet Take 5 mg by mouth daily with breakfast.     [provider]  hydrochlorothiazide (MICROZIDE) 12.5 MG capsule TAKE ONE CAPSULE BY MOUTH EVERY DAY 01/25/17   Lorretta Harp, MD  Melatonin 5 MG TABS Take 1 tablet by mouth at bedtime.    [provider]  metoprolol succinate (TOPROL-XL) 50 MG 24  hr tablet Take 1 tablet (50 mg total) by mouth daily. Take with or immediately following a meal. 02/16/17   Lorretta Harp, MD  omega-3 acid ethyl esters (LOVAZA) 1 g capsule Take 1 capsule (1 g total) by mouth 2 (two) times daily. 12/18/17   Hongalgi, Lenis Dickinson, MD  sildenafil (REVATIO) 20 MG tablet Take 20 mg by mouth as directed.    [provider]  trandolapril (MAVIK) 4 MG tablet Take 1 tablet (4 mg total) by mouth daily. 08/16/17   Lorretta Harp, MD  warfarin (COUMADIN) 7.5 MG tablet TAKE AS DIRECTED BY COUMADIN CLINIC 08/30/17   Josue Hector, MD    Family History Family History  Problem Relation Age of Onset  . Coronary artery disease Mother        bypass surgery - deceased  . Heart disease Father        murmur, valve replacement - deceased  . Breast cancer Sister   . Diabetes Unknown        grandmother  . Diabetes Paternal Grandmother   . Diabetes Paternal Aunt   .  Colon cancer Neg Hx     Social History Social History   Tobacco Use  . Smoking status: Former Smoker    Years: 30.00    Types: Cigarettes, Cigars    Last attempt to quit: 12/15/1993    Years since quitting: 24.0  . Smokeless tobacco: Never Used  Substance Use Topics  . Alcohol use: Yes    Alcohol/week: 25.2 oz    Types: 28 Shots of liquor, 14 Standard drinks or equivalent per week    Comment: drinks 2 martini's a night (2 shots in each)  . Drug use: No     Allergies   Patient has no known allergies.   Review of Systems Review of Systems  Constitutional: Negative for chills, diaphoresis, fatigue and fever.  HENT: Negative for congestion.   Eyes: Negative for visual disturbance.  Respiratory: Negative for cough, chest tightness, shortness of breath and stridor.   Cardiovascular: Negative for chest pain, palpitations and leg swelling.  Gastrointestinal: Negative for abdominal pain, diarrhea and nausea.  Genitourinary: Negative for dysuria and flank pain.  Musculoskeletal: Negative for  back pain, neck pain and neck stiffness.  Neurological: Positive for facial asymmetry, speech difficulty, numbness and headaches. Negative for dizziness, tremors, seizures, syncope, weakness and light-headedness.  Psychiatric/Behavioral: Positive for confusion. Negative for agitation.  All other systems reviewed and are negative.    Physical Exam Updated Vital Signs There were no vitals taken for this visit.  Physical Exam  Constitutional: He is oriented to person, place, and time. He appears well-developed and well-nourished. No distress.  HENT:  Mouth/Throat: Oropharynx is clear and moist. No oropharyngeal exudate.  Eyes: Conjunctivae and EOM are normal. Pupils are equal, round, and reactive to light.  Neck: Normal range of motion.  Cardiovascular: Intact distal pulses.  Murmur heard. Pulmonary/Chest: Effort normal and breath sounds normal. No stridor. No respiratory distress. He has no wheezes. He exhibits no tenderness.  Abdominal: Soft. Bowel sounds are normal. He exhibits no distension. There is no tenderness.  Musculoskeletal: He exhibits no tenderness.  Neurological: He is alert and oriented to person, place, and time. He is not disoriented. He displays no tremor and normal reflexes. A cranial nerve deficit is present. No sensory deficit. He exhibits normal muscle tone. Coordination abnormal. GCS eye subscore is 4. GCS verbal subscore is 5. GCS motor subscore is 6.  Subtle right-sided facial droop.  Mild ataxia with right finger-nose-finger.  Otherwise sensation is symmetric throughout.  Normal strength in all extremities.  Skin: Capillary refill takes less than 2 seconds. He is not diaphoretic. No erythema. No pallor.  Psychiatric: He has a normal mood and affect.  Nursing note and vitals reviewed.    ED Treatments / Results  Labs (all labs ordered are listed, but only abnormal results are displayed) Labs Reviewed  CBC WITH DIFFERENTIAL/PLATELET - Abnormal; Notable for the  following components:      Result Value   Monocytes Absolute 1.1 (*)    All other components within normal limits  COMPREHENSIVE METABOLIC PANEL - Abnormal; Notable for the following components:   Glucose, Bld 210 (*)    AST 60 (*)    ALT 80 (*)    All other components within normal limits  PROTIME-INR - Abnormal; Notable for the following components:   Prothrombin Time 22.1 (*)    All other components within normal limits  URINALYSIS, ROUTINE W REFLEX MICROSCOPIC - Abnormal; Notable for the following components:   Color, Urine STRAW (*)    Glucose, UA >=  500 (*)    Ketones, ur 5 (*)    All other components within normal limits  URINE CULTURE    EKG  EKG Interpretation  Date/Time:  Monday December 20 2017 17:50:23 EST Ventricular Rate:  79 PR Interval:    QRS Duration: 92 QT Interval:  403 QTC Calculation: 462 R Axis:   96 Text Interpretation:  Sinus rhythm Right axis deviation Nonspecific T abnormalities, lateral leads When compared to prior, no signicant changes seen.  No STEMI Confirmed by Antony Blackbird [phone]) on 12/20/2017 5:55:55 PM       Radiology Ct Head Wo Contrast  Result Date: 12/20/2017 CLINICAL DATA:  Patient with bilateral temporal headache. Recent CVA. On blood thinners. EXAM: CT HEAD WITHOUT CONTRAST TECHNIQUE: Contiguous axial images were obtained from the base of the skull through the vertex without intravenous contrast. COMPARISON:  Brain CT 12/13/2017. FINDINGS: Brain: Ventricles and sulci are prominent compatible with atrophy. Evolving infarct within the left frontoparietal region. No evidence for acute intracranial hemorrhage, mass lesion or mass-effect. Periventricular and subcortical white matter hypodensity compatible with chronic microvascular ischemic changes. Vascular: Internal carotid arterial vascular calcifications. Skull: Intact. Sinuses/Orbits: Mild mucosal thickening frontal sinus. Mastoid air cells are well aerated. Cerumen within the external  auditory canals. Other: None. IMPRESSION: Evolving infarct within the left frontoparietal region. Atrophy and chronic microvascular ischemic changes Electronically Signed   By: Lovey Newcomer M.D.   On: 12/20/2017 18:12   Dg Chest Portable 1 View  Result Date: 12/20/2017 CLINICAL DATA:  Patient with confusion. EXAM: PORTABLE CHEST 1 VIEW COMPARISON:  Chest radiograph 08/06/2016. FINDINGS: Monitoring leads overlie the patient. Stable cardiac and mediastinal contours. Aortic atherosclerosis. No consolidative pulmonary opacities. No pleural effusion or pneumothorax. IMPRESSION: No acute cardiopulmonary process. Electronically Signed   By: Lovey Newcomer M.D.   On: 12/20/2017 19:45    Procedures Procedures (including critical care time)  Medications Ordered in ED Medications - No data to display   Initial Impression / Assessment and Plan / ED Course  I have reviewed the triage vital signs and the nursing notes.  Pertinent labs & imaging results that were available during my care of the patient were reviewed by me and considered in my medical decision making (see chart for details).     Nicholaos Schippers is a 72 y.o. male with a past medical history significant for hypertension, hypercholesterolemia, diabetes, mechanical aortic valve replacement on Coumadin therapy, ITP, and recent stroke discharge from the hospital 2 days ago who presents with new onset of confusion, headaches, disorientation, and right arm numbness.  Patient reports that he was discharged 2 days ago from the hospital after having a stroke leaving him with right-sided facial droop and some difficulty with speech.  He reports that he has not had headaches following his stroke until this afternoon.  He reports he is having 9 out of 10 headache in his bilateral temples.  He describes it as a sharp but aching pain.  He reports it has been constant.  He also reports that he had gotten home from being at the gym when he was acting very  disoriented and confused.  He said this was a new problem for him.  He reports that his right arm began feeling numb although that has slightly improved since onset.  He reports that the numbness has resolved.  He denies nausea, vomiting, vision changes, conservation, diarrhea, dysuria.  He denies any chest pain, palpitations, or shortness of breath.  He denies any recent falls.  He reports that he had a prolonged stay in the hospital due to having difficulty correcting his INR to adequate levels.  He reports that his blood pressure was normal in the 140s however.  He reports he has not missed any medications.  On exam, patient has right-sided facial droop that is subtle at rest.  When patient smiles his smile is symmetric.  Patient has normal extraocular movements and pupil exam is unremarkable.  Patient has normal visual fields on exam.  Patient has clear lungs and nontender chest and back.  Patient had a clicking murmur from his mechanical valve.  Neck is nontender.  Patient had normal sensation in arms and he reports the numbness in the right arm has resolved.  Patient had symmetric grip strength and normal leg strength bilaterally.  Finger-nose-finger revealed very mild ataxia with the right arm versus the left.  Physical exam otherwise unremarkable.  Patient on patient's symptoms, I am concerned about hemorrhagic conversion in the setting of Coumadin use, elevated blood pressure and recent stroke.  Patient will have a stat CT head as well as other laboratory testing to look for etiologies of his confusion.  Anticipate reassessment after workup and we will likely speak with neurology for guidance.          CT scan showed no evidence of acute bleeding but there was evidence of evolution of the patient's recent stroke.  Laboratory testing showed evidence of subtherapeutic INR.  Patient reports that he has been taking his Coumadin as directed and has not eaten any leafy greens salads, however, his INR was  1.9 and his goal is 2.5.  Neurology was called and came to see the patient.  They are concerned that due to his subtherapeutic INR, patient may have had a new stroke.  They recommended hospitalist admission for MRI as well as further management of his subtherapeutic INR.  Hospitalist team will be called for admission.   Final Clinical Impressions(s) / ED Diagnoses   Final diagnoses:  Subtherapeutic anticoagulation  Arm numbness  Confusion     Clinical Impression: 1. Subtherapeutic anticoagulation   2. Arm numbness   3. Confusion     Disposition: Admit  This note was prepared with assistance of Dragon voice recognition software. Occasional wrong-word or sound-a-like substitutions may have occurred due to the inherent limitations of voice recognition software.      Kaysey Berndt, Gwenyth Allegra, MD 12/20/17 [phone]

## 2017-12-20 NOTE — ED Notes (Signed)
Per EMS- pt here for evaluation of sudden onset headache located at bilateral temples. Pt states he was driving and became disoriented. When he was walking he was unable to hold his keys. Pt also states this is the worst headache of his life. Pt was recently DC'd with a stroke, has resiudual right sided facial droop. States he had a headache that was similar to this last time. BP reported by EMS to be 300 palp. BP 192/53 at present.

## 2017-12-20 NOTE — ED Notes (Signed)
Spoke with patient concerning oxygen levels.  Denies wearing O2 at home.  On room air patient was was 84% on 2L nasal cannula patient increased to 95%.  Denies any other concerns at this time.

## 2017-12-20 NOTE — Consult Note (Addendum)
Requesting Physician: Dr. Sherry Ruffing    Chief Complaint: Headache, confusion  History obtained from: Patient and Chart   HPI:                                                                                                                                       [name] [name] is an 72 y.o. male with history of type 2 diabetes, stroke, PVD, hypertension, hyperlipidemia, ITP, claudication, aortic stenosis, mechanical aortic valve replacement on Coumadin recently discharged from the hospital for a left fronto-parietal infarct due to subtherapeutic anticoagulation with INR of 1.29. He was bridged with heparin and Warfarin dose was increased with INR of 2.5 at time of discharge. Deficits from the stroke includes right facial droop, right side weakness and mild dysarthria.   Patient presents today after having a severe headache this afternoon after coming from from the gym. He also felt extremely disoriented which gradually improved. His blood pressure on arrival was 192/53 with repeat 182/88 mmHg. his headache is now gradually improved and patient no longer feels confused.   Date last known well: 1.14. 19 Time last known well: Around 1 PM No TPA given as he is outside window and mild resolving symptoms NIHSS: 2 Baseline MRS 1   Past Medical History:  Diagnosis Date  . Adenomatous colon polyp 09/1997  . Anemia   . Aortic stenosis    s/p st. jude mechanical AVR - Chronic Coumadin  . Blood transfusion    "related to ITP"  . Coronary artery disease    s/p cabg x 3 11/2003: lima-lad, seq vg to rpda and rpl  . Diverticulitis of colon   . Heart murmur   . Hyperlipidemia   . Hypertension   . ITP (idiopathic thrombocytopenic purpura)   . Peripheral arterial disease ([place])    a. history of aortobifemoral bypass grafting by Dr. Sherren Mocha early b. LE angiography 04/22/2015 patent aortobifem graft, DES to R SFA  . Peripheral vascular disease ([place])    s/p Left external Iliac Artery stenting and  subsequent left femoral endarterectomy 02/2011- post op course complicated by wound infxn req I&D 03/2011  . Renal artery stenosis, native, bilateral (HCC)    a. bilateral renal artery stenosis by recent duplex ultrasound b. L renal artery stent 02/2015, R renal artery patent on angiogram  . Stroke ([place])   . TIA (transient ischemic attack) ~ 2013  . Type II diabetes mellitus ([place])     Past Surgical History:  Procedure Laterality Date  . ABDOMINAL AORTAGRAM N/A 12/16/2011   Procedure: ABDOMINAL Maxcine Ham;  Surgeon: Sherren Mocha, MD;  Location: [facility] CATH LAB;  Service: Cardiovascular;  Laterality: N/A;  . ANGIOPLASTY / STENTING ILIAC     Left external Iliac Artery  . AORTA - BILATERAL FEMORAL ARTERY BYPASS GRAFT  01/18/2012   Procedure: AORTA BIFEMORAL BYPASS GRAFT;  Surgeon: Curt Jews, MD;  Location: [place];  Service: Vascular;  Laterality: N/A;  . AORTIC VALVE REPLACEMENT  ~ 2004  . CARDIAC CATHETERIZATION  11/2003   /pt report 10/01/2016  . CARDIAC VALVE REPLACEMENT  11/2003   aortic  . CATARACT EXTRACTION W/ INTRAOCULAR LENS  IMPLANT, BILATERAL Bilateral   . CORONARY ARTERY BYPASS GRAFT  11/2003   Archie Endo 04/21/2011  . LOWER EXTREMITY ANGIOGRAM N/A 02/21/2015   Procedure: LOWER EXTREMITY ANGIOGRAM;  Surgeon: Lorretta Harp, MD;  Location: [facility] CATH LAB;  Service: Cardiovascular;  Laterality: N/A;  . PERIPHERAL VASCULAR CATHETERIZATION N/A 04/22/2015   Procedure: Lower Extremity Angiography;  Surgeon: Lorretta Harp, MD;  Location: [place] CV LAB;  Service: Cardiovascular;  Laterality: N/A;  . PERIPHERAL VASCULAR CATHETERIZATION N/A 08/24/2016   Procedure: Lower Extremity Angiography;  Surgeon: Lorretta Harp, MD;  Location: [place] CV LAB;  Service: Cardiovascular;  Laterality: N/A;  . PERIPHERAL VASCULAR CATHETERIZATION Right 10/01/2016   Procedure: Peripheral Vascular Intervention - STENT;  Surgeon: Lorretta Harp, MD;  Location: [place] CV LAB;  Service: Cardiovascular;   Laterality: Right;  Prox and MID SFA   . RENAL ANGIOGRAM N/A 02/21/2015   Procedure: RENAL ANGIOGRAM;  Surgeon: Lorretta Harp, MD;  Location: [facility] CATH LAB;  Service: Cardiovascular;  Laterality: Bilateral; 6 mm x 12 mm long Herculink balloon expandable stent to the left renal artery  . RENAL ARTERY STENT Left 04/22/2015   dr berry  . SPLENECTOMY  02/2003   Archie Endo 04/21/2011  . TONSILLECTOMY  ~ 1952    Family History  Problem Relation Age of Onset  . Coronary artery disease Mother        bypass surgery - deceased  . Heart disease Father        murmur, valve replacement - deceased  . Breast cancer Sister   . Diabetes Unknown        grandmother  . Diabetes Paternal Grandmother   . Diabetes Paternal Aunt   . Colon cancer Neg Hx    Social History:  reports that he quit smoking about 24 years ago. His smoking use included cigarettes and cigars. He quit after 30.00 years of use. he has never used smokeless tobacco. He reports that he drinks about 25.2 oz of alcohol per week. He reports that he does not use drugs.  Allergies: No Known Allergies  Medications:                                                                                                                        I reviewed home medications   ROS:  14 systems reviewed and negative except above    Examination:                                                                                                      General: Appears well-developed and well-nourished.  Psych: Affect appropriate to situation Eyes: No scleral injection HENT: No OP obstrucion Head: Normocephalic.  Cardiovascular: Normal rate and regular rhythm.  Respiratory: Effort normal and breath sounds normal to anterior ascultation GI: Soft.  No distension. There is no tenderness.  Skin: WDI   Neurological Examination Mental  Status: Alert, oriented, thought content appropriate.  Speech fluent without evidence of aphasia. Able to follow 3 step commands without difficulty. Cranial Nerves: II: Visual fields grossly normal,  III,IV, VI: ptosis not present, extra-ocular motions intact bilaterally, pupils equal, round, reactive to light and accommodation V,VII: Left facial droop present VIII: hearing normal bilaterally IX,X: uvula rises symmetrically XI: bilateral shoulder shrug XII: midline tongue extension Motor: Right : Upper extremity   4+/5    Left:     Upper extremity   5/5  Lower extremity   5/5     Lower extremity   5/5 Tone and bulk:normal tone throughout; no atrophy noted Sensory: Pinprick and light touch intact throughout, bilaterally Deep Tendon Reflexes: 3+ over biceps/triceps on left side, 2+ over biceps triceps on right side. Bilateral lower extremities 2+ plantars Plantars: Right: downgoing   Left: downgoing Cerebellar: normal finger-to-nose, normal rapid alternating movements and normal heel-to-shin test Gait: normal gait and station     Lab Results: Basic Metabolic Panel: Recent Labs  Lab 12/14/17 0440 12/15/17 0443 12/16/17 0612 12/20/17 1744  NA 139 139 138 138  K 3.7 3.4* 3.5 4.3  CL 104 105 104 102  CO2 27 25 25 26   GLUCOSE 118* 122* 135* 210*  BUN 13 10 7 20   CREATININE 0.80 0.80 0.77 1.11  CALCIUM 8.7* 8.6* 8.8* 9.7    CBC: Recent Labs  Lab 12/15/17 0443 12/16/17 0612 12/17/17 0339 12/18/17 0414 12/20/17 1744  WBC 5.4 5.9 5.1 5.4 8.9  NEUTROABS  --   --   --   --  6.7  HGB 14.0 14.4 14.2 14.6 16.4  HCT 43.0 44.1 43.3 42.8 48.0  MCV 96.4 96.5 96.7 96.8 96.2  PLT 218 218 212 207 226    Coagulation Studies: Recent Labs    12/18/17 0414 12/20/17 1744  LABPROT 26.9* 22.1*  INR 2.51 1.95    Imaging: Ct Head Wo Contrast  Result Date: 12/20/2017 CLINICAL DATA:  Patient with bilateral temporal headache. Recent CVA. On blood thinners. EXAM: CT HEAD WITHOUT  CONTRAST TECHNIQUE: Contiguous axial images were obtained from the base of the skull through the vertex without intravenous contrast. COMPARISON:  Brain CT 12/13/2017. FINDINGS: Brain: Ventricles and sulci are prominent compatible with atrophy. Evolving infarct within the left frontoparietal region. No evidence for acute intracranial hemorrhage, mass lesion or mass-effect. Periventricular and subcortical white matter hypodensity compatible with chronic microvascular ischemic changes. Vascular: Internal carotid arterial vascular calcifications. Skull: Intact. Sinuses/Orbits: Mild mucosal thickening frontal sinus. Mastoid air  cells are well aerated. Cerumen within the external auditory canals. Other: None. IMPRESSION: Evolving infarct within the left frontoparietal region. Atrophy and chronic microvascular ischemic changes Electronically Signed   By: Lovey Newcomer M.D.   On: 12/20/2017 18:12   Dg Chest Portable 1 View  Result Date: 12/20/2017 CLINICAL DATA:  Patient with confusion. EXAM: PORTABLE CHEST 1 VIEW COMPARISON:  Chest radiograph 08/06/2016. FINDINGS: Monitoring leads overlie the patient. Stable cardiac and mediastinal contours. Aortic atherosclerosis. No consolidative pulmonary opacities. No pleural effusion or pneumothorax. IMPRESSION: No acute cardiopulmonary process. Electronically Signed   By: Lovey Newcomer M.D.   On: 12/20/2017 19:45     ASSESSMENT AND PLAN   72 y.o. male with history of type 2 diabetes, stroke, PVD, hypertension, hyperlipidemia, ITP, claudication, aortic stenosis, mechanical aortic valve replacement, with a recent stroke in the setting of subtherapeutic INR on Coumadin presents with headache, confusion.  His blood pressure is elevated.CT head ruled out hemorrhage.  However given his high risk for having another stroke, especially in the setting of subtherapeutic INR of 1.9 (goal is 2.5-3.5), he warrants an MRI brain to rule out stroke. If MRI brain is negative, the most likely  explanation for his presentation would be hypertensive emergency, and less likely a TIA.  Hypertensive emergency versus TIA versus stroke  Recommendations MRI brain to evaluate for new infarct Admit for observation for blood pressure control/workup to r/o stroke  Continue Coumadin, if MRI negative for stroke consider Lovenox/heparin drip for bridging if value tomorrow is also below <2. I would not recommend Lovenox bridge if INR value greater than 2. Neurochecks every 2 hours. BP goal: Less than 180, gradual reduction to 832 systolic.   Sushanth Aroor [facility] Pager Number [phone]

## 2017-12-20 NOTE — ED Triage Notes (Signed)
Patient from home via EMS for weakness, dizziness, and headache from today.  Recently discharged from hospital on Saturday.  Today was driving and stated he got v ery disoriented.  LKW 8am had numbness and tingling in arms since then.  Recently admitted with slurred speech and left facial droop which persist still.

## 2017-12-21 ENCOUNTER — Telehealth: Payer: Self-pay | Admitting: Internal Medicine

## 2017-12-21 ENCOUNTER — Observation Stay (HOSPITAL_COMMUNITY): Payer: Medicare Other

## 2017-12-21 ENCOUNTER — Encounter (HOSPITAL_COMMUNITY): Payer: Self-pay | Admitting: *Deleted

## 2017-12-21 DIAGNOSIS — I6389 Other cerebral infarction: Secondary | ICD-10-CM | POA: Diagnosis not present

## 2017-12-21 DIAGNOSIS — Z952 Presence of prosthetic heart valve: Secondary | ICD-10-CM

## 2017-12-21 DIAGNOSIS — I10 Essential (primary) hypertension: Secondary | ICD-10-CM

## 2017-12-21 DIAGNOSIS — Z7901 Long term (current) use of anticoagulants: Secondary | ICD-10-CM

## 2017-12-21 DIAGNOSIS — R2990 Unspecified symptoms and signs involving the nervous system: Secondary | ICD-10-CM | POA: Diagnosis not present

## 2017-12-21 DIAGNOSIS — Z5181 Encounter for therapeutic drug level monitoring: Secondary | ICD-10-CM | POA: Diagnosis not present

## 2017-12-21 DIAGNOSIS — G459 Transient cerebral ischemic attack, unspecified: Secondary | ICD-10-CM | POA: Diagnosis not present

## 2017-12-21 LAB — COMPREHENSIVE METABOLIC PANEL
ALK PHOS: 61 U/L (ref 38–126)
ALT: 61 U/L (ref 17–63)
AST: 38 U/L (ref 15–41)
Albumin: 3.8 g/dL (ref 3.5–5.0)
Anion gap: 11 (ref 5–15)
BUN: 15 mg/dL (ref 6–20)
CALCIUM: 9.2 mg/dL (ref 8.9–10.3)
CHLORIDE: 101 mmol/L (ref 101–111)
CO2: 25 mmol/L (ref 22–32)
CREATININE: 0.87 mg/dL (ref 0.61–1.24)
Glucose, Bld: 123 mg/dL — ABNORMAL HIGH (ref 65–99)
Potassium: 3.7 mmol/L (ref 3.5–5.1)
SODIUM: 137 mmol/L (ref 135–145)
Total Bilirubin: 1.5 mg/dL — ABNORMAL HIGH (ref 0.3–1.2)
Total Protein: 6.5 g/dL (ref 6.5–8.1)

## 2017-12-21 LAB — LIPID PANEL
Cholesterol: 178 mg/dL (ref 0–200)
HDL: 54 mg/dL (ref >40)
LDL CALC: 100 mg/dL — AB (ref 0–99)
TRIGLYCERIDES: 121 mg/dL (ref <150)
Total CHOL/HDL Ratio: 3.3 RATIO
VLDL: 24 mg/dL (ref 0–40)

## 2017-12-21 LAB — CBC
HCT: 47.1 % (ref 39.0–52.0)
Hemoglobin: 15.7 g/dL (ref 13.0–17.0)
MCH: 32.2 pg (ref 26.0–34.0)
MCHC: 33.3 g/dL (ref 30.0–36.0)
MCV: 96.5 fL (ref 78.0–100.0)
PLATELETS: 191 10*3/uL (ref 150–400)
RBC: 4.88 MIL/uL (ref 4.22–5.81)
RDW: 14.5 % (ref 11.5–15.5)
WBC: 6.6 10*3/uL (ref 4.0–10.5)

## 2017-12-21 LAB — HEMOGLOBIN A1C
HEMOGLOBIN A1C: 6.7 % — AB (ref 4.8–5.6)
MEAN PLASMA GLUCOSE: 145.59 mg/dL

## 2017-12-21 LAB — PROTIME-INR
INR: 1.85
PROTHROMBIN TIME: 21.2 s — AB (ref 11.4–15.2)

## 2017-12-21 MED ORDER — ENOXAPARIN SODIUM 80 MG/0.8ML ~~LOC~~ SOLN: 1.0000 mg/kg | Freq: Two times a day (BID) | SUBCUTANEOUS | Status: DC

## 2017-12-21 MED ORDER — WARFARIN SODIUM 5 MG PO TABS: 10.0000 mg | ORAL_TABLET | Freq: Once | ORAL | Status: DC

## 2017-12-21 MED ORDER — ACETAMINOPHEN 160 MG/5ML PO SOLN: 650.0000 mg | ORAL | Status: DC | PRN

## 2017-12-21 MED ORDER — STROKE: EARLY STAGES OF RECOVERY BOOK
Freq: Once | Status: AC
  Administered 2017-12-21: 03:00:00
  Filled 2017-12-21: qty 1

## 2017-12-21 MED ORDER — ENOXAPARIN SODIUM 80 MG/0.8ML ~~LOC~~ SOLN
1.0000 mg/kg | Freq: Once | SUBCUTANEOUS | Status: AC
  Administered 2017-12-21: 75 mg via SUBCUTANEOUS
  Filled 2017-12-21: qty 0.8

## 2017-12-21 MED ORDER — AMLODIPINE BESYLATE 10 MG PO TABS
10.0000 mg | ORAL_TABLET | Freq: Every day | ORAL | Status: DC
  Administered 2017-12-21: 10 mg via ORAL
  Filled 2017-12-21: qty 1

## 2017-12-21 MED ORDER — ATORVASTATIN CALCIUM 80 MG PO TABS
80.0000 mg | ORAL_TABLET | Freq: Every day | ORAL | Status: DC
  Administered 2017-12-21: 80 mg via ORAL
  Filled 2017-12-21: qty 1

## 2017-12-21 MED ORDER — ASPIRIN EC 81 MG PO TBEC
81.0000 mg | DELAYED_RELEASE_TABLET | Freq: Every day | ORAL | Status: DC
  Administered 2017-12-21: 81 mg via ORAL
  Filled 2017-12-21: qty 1

## 2017-12-21 MED ORDER — WARFARIN SODIUM 10 MG PO TABS: 10.0000 mg | ORAL_TABLET | Freq: Every day | ORAL | 0 refills | Status: DC

## 2017-12-21 MED ORDER — SENNOSIDES-DOCUSATE SODIUM 8.6-50 MG PO TABS: 1.0000 | ORAL_TABLET | Freq: Every evening | ORAL | Status: DC | PRN

## 2017-12-21 MED ORDER — HYDROCHLOROTHIAZIDE 12.5 MG PO CAPS
12.5000 mg | ORAL_CAPSULE | Freq: Every day | ORAL | Status: DC
  Administered 2017-12-21: 12.5 mg via ORAL
  Filled 2017-12-21: qty 1

## 2017-12-21 MED ORDER — TRANDOLAPRIL 4 MG PO TABS
4.0000 mg | ORAL_TABLET | Freq: Every day | ORAL | Status: DC
  Administered 2017-12-21: 4 mg via ORAL
  Filled 2017-12-21: qty 1

## 2017-12-21 MED ORDER — CANAGLIFLOZIN 100 MG PO TABS
100.0000 mg | ORAL_TABLET | Freq: Every day | ORAL | Status: DC
  Administered 2017-12-21: 100 mg via ORAL
  Filled 2017-12-21: qty 1

## 2017-12-21 MED ORDER — ACETAMINOPHEN 650 MG RE SUPP: 650.0000 mg | RECTAL | Status: DC | PRN

## 2017-12-21 MED ORDER — OMEGA-3-ACID ETHYL ESTERS 1 G PO CAPS
1.0000 g | ORAL_CAPSULE | Freq: Two times a day (BID) | ORAL | Status: DC
  Administered 2017-12-21: 1 g via ORAL
  Filled 2017-12-21: qty 1

## 2017-12-21 MED ORDER — LORAZEPAM 0.5 MG PO TABS
0.5000 mg | ORAL_TABLET | Freq: Once | ORAL | Status: AC
  Administered 2017-12-21: 0.5 mg via ORAL
  Filled 2017-12-21: qty 1

## 2017-12-21 MED ORDER — SODIUM CHLORIDE 0.9 % IV SOLN
INTRAVENOUS | Status: DC
  Administered 2017-12-21: 03:00:00 via INTRAVENOUS

## 2017-12-21 MED ORDER — WARFARIN - PHARMACIST DOSING INPATIENT: Freq: Every day | Status: DC

## 2017-12-21 MED ORDER — GLYBURIDE 5 MG PO TABS
5.0000 mg | ORAL_TABLET | Freq: Two times a day (BID) | ORAL | Status: DC
  Administered 2017-12-21: 5 mg via ORAL
  Filled 2017-12-21 (×2): qty 1

## 2017-12-21 MED ORDER — SILDENAFIL CITRATE 20 MG PO TABS: 100.0000 mg | ORAL_TABLET | Freq: Every day | ORAL | Status: DC | PRN

## 2017-12-21 MED ORDER — METOPROLOL SUCCINATE ER 50 MG PO TB24
50.0000 mg | ORAL_TABLET | Freq: Every day | ORAL | Status: DC
  Administered 2017-12-21: 50 mg via ORAL
  Filled 2017-12-21: qty 1

## 2017-12-21 MED ORDER — ACETAMINOPHEN 325 MG PO TABS: 650.0000 mg | ORAL_TABLET | ORAL | Status: DC | PRN

## 2017-12-21 MED ORDER — ENOXAPARIN SODIUM 80 MG/0.8ML ~~LOC~~ SOLN: 80.0000 mg | Freq: Two times a day (BID) | SUBCUTANEOUS | 0 refills | Status: DC

## 2017-12-21 NOTE — Progress Notes (Signed)
ANTICOAGULATION CONSULT NOTE - Initial Consult  Pharmacy Consult for warfarin Indication: mechanical valve  No Known Allergies   Vital Signs: Temp: 99 F (37.2 C) (01/14 2207) Temp Source: Oral (01/14 2207) BP: 131/52 (01/14 2200) Pulse Rate: 74 (01/14 2230)  Labs: Recent Labs    12/18/17 0414 12/20/17 1744  HGB 14.6 16.4  HCT 42.8 48.0  PLT 207 226  LABPROT 26.9* 22.1*  INR 2.51 1.95  HEPARINUNFRC 0.26*  --   CREATININE  --  1.11    Estimated Creatinine Clearance: 58.2 mL/min (by C-G formula based on SCr of 1.11 mg/dL).   Medical History: Past Medical History:  Diagnosis Date  . Adenomatous colon polyp 09/1997  . Anemia   . Aortic stenosis    s/p st. jude mechanical AVR - Chronic Coumadin  . Blood transfusion    "related to ITP"  . Coronary artery disease    s/p cabg x 3 11/2003: lima-lad, seq vg to rpda and rpl  . Diverticulitis of colon   . Heart murmur   . Hyperlipidemia   . Hypertension   . ITP (idiopathic thrombocytopenic purpura)   . Peripheral arterial disease ([place])    a. history of aortobifemoral bypass grafting by Dr. Sherren Mocha early b. LE angiography 04/22/2015 patent aortobifem graft, DES to R SFA  . Peripheral vascular disease ([place])    s/p Left external Iliac Artery stenting and subsequent left femoral endarterectomy 02/2011- post op course complicated by wound infxn req I&D 03/2011  . Renal artery stenosis, native, bilateral (HCC)    a. bilateral renal artery stenosis by recent duplex ultrasound b. L renal artery stent 02/2015, R renal artery patent on angiogram  . Stroke ([place])   . TIA (transient ischemic attack) ~ 2013  . Type II diabetes mellitus [facility])     Assessment: 72 yo male with admitted with dizziness - called a code stroke. tpa was not given as his symptoms began resolving. He was also recently admitted and discharged from the hospital. He is on warfarin for a mechanical AVR and hx of stroke. Current INR is SUBtherapeutic at 1.95. MD is aware  his INR is subtherapeutic and is only recommending bridging patient with parenteral anticoagulant if his INR falls less than 2 (if brain MRI is negative).     Goal of Therapy:  INR 2.5-3.5 Monitor platelets by anticoagulation protocol: Yes    Plan:  -Warfarin 10 mg po x1 -Daily INR -F/u with patient exact PTA regiment -F/u brain MRI   Harvel Quale 12/21/2017,1:09 AM

## 2017-12-21 NOTE — Progress Notes (Signed)
Patient belongings sent to security per patient request

## 2017-12-21 NOTE — Progress Notes (Signed)
PT Cancellation Note  Patient Details Name: [name] [name] MRN: [id number] DOB: January 04, 1946   Cancelled Treatment:    Reason Eval/Treat Not Completed: PT screened, no needs identified, will sign off.  Pt screened by OT.  No PT needs identified.  PT to sign off.   Thanks,    Barbarann Ehlers. [place], [place], DPT [phone]   12/21/2017, 9:31 AM

## 2017-12-21 NOTE — Care Management Note (Signed)
Case Management Note  Patient Details  Name: [name] [name] MRN: [id number] Date of Birth: 08/24/1946  Subjective/Objective:     Pt in with stroke like symptoms. He is from home alone.                Action/Plan: Pt discharging home with self care. Pt d/cing with lovenox. Per bedside RN patient has had lovenox at home before and can self administer.  Pt has PCP, insurance and transportation home. No further needs per CM.   Expected Discharge Date:  12/21/17               Expected Discharge Plan:  Home/Self Care  In-House Referral:     Discharge planning Services     Post Acute Care Choice:    Choice offered to:     DME Arranged:    DME Agency:     HH Arranged:    HH Agency:     Status of Service:  Completed, signed off  If discussed at [employer] of Stay Meetings, dates discussed:    Additional Comments:  Pollie Friar, RN 12/21/2017, 2:18 PM

## 2017-12-21 NOTE — Discharge Summary (Signed)
Physician Discharge Summary  [name] [name] GQQ:[id number] DOB: [birth year] DOA: 12/20/2017  PCP: Shon Baton, MD  Admit date: 12/20/2017 Discharge date: 12/21/2017   Recommendations for Outpatient Follow-Up:   1. lovenox overlapped with warfarin with goal of 2.5-3.5   Discharge Diagnosis:   Principal Problem:   Stroke-like symptoms Active Problems:   Diabetes ([place])   Hypertension   S/P AVR   Subtherapeutic international normalized ratio (INR)   TIA (transient ischemic attack)   Discharge disposition:  Home  Discharge Condition: Improved.  Diet recommendation: carb mod/heart healthy  Wound care: None.   History of Present Illness:   72 y.o. male with history of type 2 diabetes, stroke, PVD, hypertension, hyperlipidemia, ITP, claudication, aortic stenosis, mechanical aortic valve replacement, with a recent stroke in the setting of subtherapeutic INR on Coumadin presents with headache, confusion.  His blood pressure is elevated.CT head ruled out hemorrhage.  However given his high risk for having another stroke, especially in the setting of subtherapeutic INR of 1.9 (goal is 2.5-3.5), he warrants an MRI brain to rule out stroke. If MRI brain is negative, the most likely explanation for his presentation would be hypertensive emergency, and less likely a TIA.      Hospital Course by Problem:   Hypertensive emergency versus TIA in patient with mechanical valve MRI stable -needs close INR follow up -increased coumadin to 10 mg  HTN -resumed home meds -BP stable     Medical Consultants:    Neuro/cards (phone)   Discharge Exam:   Vitals:   12/21/17 0527 12/21/17 0935  BP: (!) 102/54 (!) 108/30  Pulse: 67 71  Resp: 18 18  Temp: 98.5 F (36.9 C) 98.4 F (36.9 C)  SpO2: 93% 94%   Vitals:   12/21/17 0209 12/21/17 0327 12/21/17 0527 12/21/17 0935  BP: (!) 104/41 (!) 116/46 (!) 102/54 (!) 108/30  Pulse: 73 (!) 58 67 71  Resp: 18 18 18 18   Temp:  98.4 F (36.9 C) 98.5 F (36.9 C) 98.5 F (36.9 C) 98.4 F (36.9 C)  TempSrc: Oral Oral Oral Oral  SpO2: 94% 96% 93% 94%    Gen:  NAD- anxious to go home   The results of significant diagnostics from this hospitalization (including imaging, microbiology, ancillary and laboratory) are listed below for reference.     Procedures and Diagnostic Studies:   Ct Head Wo Contrast  Result Date: 12/20/2017 CLINICAL DATA:  Patient with bilateral temporal headache. Recent CVA. On blood thinners. EXAM: CT HEAD WITHOUT CONTRAST TECHNIQUE: Contiguous axial images were obtained from the base of the skull through the vertex without intravenous contrast. COMPARISON:  Brain CT 12/13/2017. FINDINGS: Brain: Ventricles and sulci are prominent compatible with atrophy. Evolving infarct within the left frontoparietal region. No evidence for acute intracranial hemorrhage, mass lesion or mass-effect. Periventricular and subcortical white matter hypodensity compatible with chronic microvascular ischemic changes. Vascular: Internal carotid arterial vascular calcifications. Skull: Intact. Sinuses/Orbits: Mild mucosal thickening frontal sinus. Mastoid air cells are well aerated. Cerumen within the external auditory canals. Other: None. IMPRESSION: Evolving infarct within the left frontoparietal region. Atrophy and chronic microvascular ischemic changes Electronically Signed   By: Lovey Newcomer M.D.   On: 12/20/2017 18:12   Mr Brain Wo Contrast  Result Date: 12/21/2017 CLINICAL DATA:  Lateral acute onset dizziness and headache while driving today. Slurred speech and LEFT facial droop. Sub therapeutic on warfarin. History of hypertension, hyperlipidemia, stroke. EXAM: MRI HEAD WITHOUT CONTRAST TECHNIQUE: Multiplanar, multiecho pulse sequences of the brain  and surrounding structures were obtained without intravenous contrast. COMPARISON:  CT HEAD December 20, 2017 and MRI of the head December 13, 2017 FINDINGS: BRAIN: No reduced  diffusion to suggest acute ischemia. Normalization of LEFT frontal diffusion abnormality from prior MRI. No susceptibility artifact to suggest hemorrhage. Numerous old small bilateral cerebellar infarcts. Old LEFT basal ganglia lacunar infarcts. Evolving subacute LEFT frontal lobe small infarct. Patchy to confluent supratentorial and pontine white matter FLAIR T2 hyperintensities. Stable moderate parenchymal brain volume loss without hydrocephalus. No midline shift, mass effect or masses. No abnormal extra-axial fluid collections. VASCULAR: Normal major intracranial vascular flow voids present at skull base. SKULL AND UPPER CERVICAL SPINE: No abnormal sellar expansion. No suspicious calvarial bone marrow signal. Craniocervical junction maintained. SINUSES/ORBITS: Trace paranasal sinus mucosal thickening. Imaged mastoid air cells are well aerated. The included ocular globes and orbital contents are non-suspicious. Status post bilateral ocular lens implants. OTHER: None. IMPRESSION: 1. No acute intracranial process. 2. Subacute small LEFT frontal lobe/MCA territory infarct. 3. Old supra and infratentorial lacunar infarcts and moderate chronic small vessel ischemic disease. Electronically Signed   By: Elon Alas M.D.   On: 12/21/2017 04:54   Dg Chest Portable 1 View  Result Date: 12/20/2017 CLINICAL DATA:  Patient with confusion. EXAM: PORTABLE CHEST 1 VIEW COMPARISON:  Chest radiograph 08/06/2016. FINDINGS: Monitoring leads overlie the patient. Stable cardiac and mediastinal contours. Aortic atherosclerosis. No consolidative pulmonary opacities. No pleural effusion or pneumothorax. IMPRESSION: No acute cardiopulmonary process. Electronically Signed   By: Lovey Newcomer M.D.   On: 12/20/2017 19:45     Labs:   Basic Metabolic Panel: Recent Labs  Lab 12/15/17 0443 12/16/17 0612 12/20/17 1744 12/21/17 0752  NA 139 138 138 137  K 3.4* 3.5 4.3 3.7  CL 105 104 102 101  CO2 25 25 26 25   GLUCOSE 122*  135* 210* 123*  BUN 10 7 20 15   CREATININE 0.80 0.77 1.11 0.87  CALCIUM 8.6* 8.8* 9.7 9.2   GFR Estimated Creatinine Clearance: 74.3 mL/min (by C-G formula based on SCr of 0.87 mg/dL). Liver Function Tests: Recent Labs  Lab 12/20/17 1744 12/21/17 0752  AST 60* 38  ALT 80* 61  ALKPHOS 70 61  BILITOT 1.1 1.5*  PROT 7.4 6.5  ALBUMIN 4.0 3.8   No results for input(s): LIPASE, AMYLASE in the last 168 hours. No results for input(s): AMMONIA in the last 168 hours. Coagulation profile Recent Labs  Lab 12/16/17 0612 12/17/17 0339 12/18/17 0414 12/20/17 1744 12/21/17 0752  INR 1.70 2.12 2.51 1.95 1.85    CBC: Recent Labs  Lab 12/16/17 0612 12/17/17 0339 12/18/17 0414 12/20/17 1744 12/21/17 0752  WBC 5.9 5.1 5.4 8.9 6.6  NEUTROABS  --   --   --  6.7  --   HGB 14.4 14.2 14.6 16.4 15.7  HCT 44.1 43.3 42.8 48.0 47.1  MCV 96.5 96.7 96.8 96.2 96.5  PLT 218 212 207 226 191   Cardiac Enzymes: No results for input(s): CKTOTAL, CKMB, CKMBINDEX, TROPONINI in the last 168 hours. BNP: Invalid input(s): POCBNP CBG: Recent Labs  Lab 12/17/17 1145 12/17/17 1717 12/17/17 2213 12/18/17 0610 12/18/17 1110  GLUCAP 204* 217* 242* 210* 215*   D-Dimer No results for input(s): DDIMER in the last 72 hours. Hgb A1c Recent Labs    12/21/17 0752  HGBA1C 6.7*   Lipid Profile Recent Labs    12/21/17 0752  CHOL 178  HDL 54  LDLCALC 100*  TRIG 121  CHOLHDL 3.3  Thyroid function studies No results for input(s): TSH, T4TOTAL, T3FREE, THYROIDAB in the last 72 hours.  Invalid input(s): FREET3 Anemia work up No results for input(s): VITAMINB12, FOLATE, FERRITIN, TIBC, IRON, RETICCTPCT in the last 72 hours. Microbiology No results found for this or any previous visit (from the past 240 hour(s)).   Discharge Instructions:   Discharge Instructions    Diet - low sodium heart healthy   Complete by:  As directed    Discharge instructions   Complete by:  As directed     Overlap coumadin/lovenox until directed by the coumadin clinic   Increase activity slowly   Complete by:  As directed      Allergies as of 12/21/2017   No Known Allergies     Medication List    TAKE these medications   amLODipine 10 MG tablet Commonly known as:  NORVASC Take 10 mg by mouth daily.   aspirin EC 81 MG tablet Take 81 mg by mouth daily.   atorvastatin 80 MG tablet Commonly known as:  LIPITOR Take 80 mg by mouth daily.   canagliflozin 100 MG Tabs tablet Commonly known as:  INVOKANA Take 100 mg by mouth daily.   enoxaparin 80 MG/0.8ML injection Commonly known as:  LOVENOX Inject 0.8 mLs (80 mg total) into the skin every 12 (twelve) hours.   glyBURIDE 2.5 MG tablet Commonly known as:  DIABETA Take 5 mg by mouth 2 (two) times daily with a meal.   hydrochlorothiazide 12.5 MG capsule Commonly known as:  MICROZIDE TAKE ONE CAPSULE BY MOUTH EVERY DAY What changed:    how much to take  how to take this  when to take this   metoprolol succinate 50 MG 24 hr tablet Commonly known as:  TOPROL-XL Take 1 tablet (50 mg total) by mouth daily. Take with or immediately following a meal.   omega-3 acid ethyl esters 1 g capsule Commonly known as:  LOVAZA Take 1 capsule (1 g total) by mouth 2 (two) times daily.   sildenafil 20 MG tablet Commonly known as:  REVATIO Take 100 mg by mouth daily as needed (erectile dysfunction).   trandolapril 4 MG tablet Commonly known as:  MAVIK Take 1 tablet (4 mg total) by mouth daily.   warfarin 10 MG tablet Commonly known as:  COUMADIN Take as directed. If you are unsure how to take this medication, talk to your nurse or doctor. Original instructions:  Take 1 tablet (10 mg total) by mouth daily at 6 PM. What changed:    medication strength  See the new instructions.      Follow-up Information    Shon Baton, MD Follow up in 1 week(s).   Specialty:  Internal Medicine Contact information: [address] [place] [address] [phone]        Josue Hector, MD Follow up.   Specialty:  Cardiology Contact information: [address] [place] [address] [phone]        keep INR check for tomm,they will direct your dose/next INR check Follow up.            Time coordinating discharge: 35 min  Signed:  Geradine Girt   [facility] 12/21/2017, 3:58 PM

## 2017-12-21 NOTE — Progress Notes (Signed)
Pt given discharge instructions and belongings from security. He understands how to give his Lovenox injections. Pt taken down via wheelchair by volunteer services.

## 2017-12-21 NOTE — Telephone Encounter (Signed)
Spoke to IM physician  Pt with TIA  INR 1.9   On Lovenox and coumadin now Going home   Spoke to M Supple  Recomm sl boost for coumadin today   Keep appt tomorrow.

## 2017-12-21 NOTE — Progress Notes (Signed)
ANTICOAGULATION CONSULT NOTE  Pharmacy Consult for warfarin; add Lovenox bridge Indication: aortic mechanical valve and stroke  No Known Allergies   Vital Signs: Temp: 98.4 F (36.9 C) (01/15 0935) Temp Source: Oral (01/15 0935) BP: 108/30 (01/15 0935) Pulse Rate: 71 (01/15 0935)  Labs: Recent Labs    12/20/17 1744 12/21/17 0752  HGB 16.4 15.7  HCT 48.0 47.1  PLT 226 191  LABPROT 22.1* 21.2*  INR 1.95 1.85  CREATININE 1.11 0.87    Estimated Creatinine Clearance: 74.3 mL/min (by C-G formula based on SCr of 0.87 mg/dL).  Assessment: 72 yo male with admitted with dizziness - called a code stroke. tpa was not given as his symptoms began resolving. He was also recently admitted and discharged from the hospital. He is on warfarin for a mechanical AVR and hx of stroke. Current INR is SUBtherapeutic at 1.85. Pharmacy consulted to begin Lovenox bridge. Spoke with Dr. Eliseo Squires who clarified with stroke team that it is ok to use Lovenox in patient with recent stroke.  Clarified with patient that he has been taking warfarin 7.5 mg PO daily instead of 7.5 mg daily except 11.25 mg on Sun per clinic record.   Goal of Therapy:  INR 2.5-3.5 Monitor platelets by anticoagulation protocol: Yes   Plan:  -Lovenox 75 mg SQ q12h -Warfarin as previously ordered -Daily INR -Monitor for s/sx of bleeding   Renold Genta, PharmD, BCPS Clinical Pharmacist Phone for today - [place] - [phone] 12/21/2017 1:00 PM

## 2017-12-21 NOTE — Evaluation (Signed)
Occupational Therapy Evaluation Patient Details Name: [name] [name] MRN: [id number] DOB: 1946-04-01 Today's Date: 12/21/2017    History of Present Illness 72 y.o. male with medical history significant of mechanical valve was recent acute CVA who has had multiple episode of subtherapeutic INR and warfarin presenting with sudden onset of dizziness and headaches while driving today.   Clinical Impression   PTA, pt living alone and independent. Pt symptoms resolved and functioning at baseline level for ADLs and functional mobility. Reviewed BEFAST for stroke signs and symptoms. Recommend dc home once medically stable per physician. All acute OT needs met and will sign off.     Follow Up Recommendations  No OT follow up;Supervision - Intermittent    Equipment Recommendations  None recommended by OT    Recommendations for Other Services       Precautions / Restrictions Precautions Precautions: None Restrictions Weight Bearing Restrictions: No      Mobility Bed Mobility               General bed mobility comments: Sitting at EOB  Transfers Overall transfer level: Independent                    Balance Overall balance assessment: No apparent balance deficits (not formally assessed)                                         ADL either performed or assessed with clinical judgement   ADL Overall ADL's : Needs assistance/impaired Eating/Feeding: Modified independent;Sitting   Grooming: Supervision/safety;Wash/dry hands;Standing                   Armed forces technical officer: Supervision/safety;Ambulation   Toileting- Clothing Manipulation and Hygiene: Supervision/safety;Sit to/from stand         General ADL Comments: Pt symptoms resolved and back to baseline. Pt performing ADLs and funcitonal mobility with supervision.     Vision Baseline Vision/History: Wears glasses Wears Glasses: Reading only Patient Visual Report: No change from  baseline       Perception     Praxis      Pertinent Vitals/Pain Pain Assessment: No/denies pain     Hand Dominance Right   Extremity/Trunk Assessment Upper Extremity Assessment Upper Extremity Assessment: Overall WFL for tasks assessed   Lower Extremity Assessment Lower Extremity Assessment: Overall WFL for tasks assessed   Cervical / Trunk Assessment Cervical / Trunk Assessment: Normal   Communication Communication Communication: Other (comment)(Slight slurr)   Cognition Arousal/Alertness: Awake/alert Behavior During Therapy: WFL for tasks assessed/performed Overall Cognitive Status: Within Functional Limits for tasks assessed                                     General Comments  Reviewed BEFAST    Exercises     Shoulder Instructions      Home Living Family/patient expects to be discharged to:: Private residence Living Arrangements: Alone Available Help at Discharge: Friend(s);Available PRN/intermittently Type of Home: House(Condo) Home Access: Stairs to enter Entrance Stairs-Number of Steps: Two slights Entrance Stairs-Rails: Right Home Layout: One level     Bathroom Shower/Tub: Occupational psychologist: Standard     Home Equipment: Shower seat - built in          Prior Functioning/Environment Level of Independence: Independent  Comments: ADLs, IADLs, and enjoys playing golf        OT Problem List: Impaired balance (sitting and/or standing);Decreased activity tolerance;Decreased knowledge of use of DME or AE      OT Treatment/Interventions:      OT Goals(Current goals can be found in the care plan section) Acute Rehab OT Goals Patient Stated Goal: Go home today OT Goal Formulation: All assessment and education complete, DC therapy  OT Frequency:     Barriers to D/C:            Co-evaluation              AM-PAC PT "6 Clicks" Daily Activity     Outcome Measure Help from another person eating  meals?: None Help from another person taking care of personal grooming?: None Help from another person toileting, which includes using toliet, bedpan, or urinal?: None Help from another person bathing (including washing, rinsing, drying)?: None Help from another person to put on and taking off regular upper body clothing?: None Help from another person to put on and taking off regular lower body clothing?: None 6 Click Score: 24   End of Session Nurse Communication: Mobility status  Activity Tolerance: Patient tolerated treatment well Patient left: with call bell/phone within reach;in chair;with nursing/sitter in room  OT Visit Diagnosis: Unsteadiness on feet (R26.81);Other abnormalities of gait and mobility (R26.89);Muscle weakness (generalized) (M62.81);Other symptoms and signs involving cognitive function                Time: 1224-8250 OT Time Calculation (min): 11 min Charges:  OT General Charges $OT Visit: 1 Visit OT Evaluation $OT Eval Low Complexity: 1 Low G-Codes:     Patrycja Mumpower MSOT, OTR/L Acute Rehab Pager: [phone] Office: [place] 12/21/2017, 10:58 AM

## 2017-12-22 ENCOUNTER — Ambulatory Visit (INDEPENDENT_AMBULATORY_CARE_PROVIDER_SITE_OTHER): Payer: Medicare Other | Admitting: *Deleted

## 2017-12-22 DIAGNOSIS — I359 Nonrheumatic aortic valve disorder, unspecified: Secondary | ICD-10-CM | POA: Diagnosis not present

## 2017-12-22 DIAGNOSIS — G459 Transient cerebral ischemic attack, unspecified: Secondary | ICD-10-CM

## 2017-12-22 DIAGNOSIS — Z5181 Encounter for therapeutic drug level monitoring: Secondary | ICD-10-CM

## 2017-12-22 LAB — URINE CULTURE

## 2017-12-22 LAB — POCT INR: INR: 2.3

## 2017-12-22 NOTE — Patient Instructions (Signed)
Description   Continue taking Lovenox injections twice a day.  Start taking 10mg  daily except 5mg  on Mondays and Fridays. Recheck INR on Monday.

## 2017-12-24 ENCOUNTER — Encounter: Payer: Self-pay | Admitting: Cardiovascular Disease

## 2017-12-24 ENCOUNTER — Ambulatory Visit (INDEPENDENT_AMBULATORY_CARE_PROVIDER_SITE_OTHER): Payer: Medicare Other | Admitting: Cardiovascular Disease

## 2017-12-24 DIAGNOSIS — I701 Atherosclerosis of renal artery: Secondary | ICD-10-CM | POA: Diagnosis not present

## 2017-12-24 DIAGNOSIS — I739 Peripheral vascular disease, unspecified: Secondary | ICD-10-CM

## 2017-12-24 NOTE — Progress Notes (Signed)
12/24/2017 [name] [name]   1946/09/04  [id number]  Primary Physician Shon Baton, MD Primary Cardiologist: Lorretta Harp MD [name], [place]  HPI:  [name] [name] is a 72 y.o.  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 I last saw him in the office  11/05/17. He retired a little over 2 years ago having worked there for 25 years. His primary care physician is Dr. Virgina Jock. He was referred by Dr. Johnsie Cancel for peripheral vascular evaluation because of resistant hypertension with duplex evidence of bilateral renal artery stenosis as well as new-onset right calf claudication with recent Dopplers that showed a lesion in his mid right SFA. He does have a history of remote tobacco abuse as well as treated hypertension, hyperlipidemia and diabetes. He has had coronary bypass grafting and [employer] Jude aVR performed by Dr. Cyndia Bent December 2004 on Coumadin anticoagulation. He has also had aortobifemoral bypass grafting, left external iliac stenting and left common femoral endarterectomy performed by Dr. Donnetta Hutching. I performed left renal artery stenting 02/21/15 with an excellent angiographic and clinical result. His blood pressure has been under better control. I then perform staged right SFA directional atherectomy 04/22/15 removing a high-grade eccentric calcified mid to distal right SFA plaque. He did have progression of disease at the origin of his right SFA. His claudication markedly improved after his procedure however since I saw on the year ago he's had some recurrent claudication. His blood pressures have been under better control since his renal artery intervention. Recent lower extremity Doppler studies performed 07/15/16 revealed a right ABI 0.34 with a new high-frequency signal in the origin of his right SFA. I performed angiography 08/24/16 revealing a 95%  proximal right SFA stenosis, 80% mid right SFA stenosis and 80% calcified mid left SFA stenosis. In addition, he had 75% "in-stent restenosis" within the previously placed left renal artery stent. Intervention will be somewhat challenging given the proximity of the proximal right SFA stenosis . He underwent peripheral angiography intervention by myself 10/01/16. Right antegrade approach. I was able to stick the hood of his aortobifem and intrarenal in the proximal right SFA with drug-eluting [place] plasty and stents. I also performed drug-eluting plasty in the mid right SFA. His symptoms resolved after that and he is back playing golf. He was discharged on the following day. His Dopplers have normalized. Since I saw him in the office a year ago he's remained stable. He denies claudication. His most recent Dopplers performed in July of last year revealed widely patent proximal right SFA stent and mid right SFA. Since I saw him several months ago he was recently admitted with a stroke. His INR was subtherapeutic for unclear reasons. He was re-anticoagulated. An MRI showed a small left frontal CVA. He did have some facial drooping and numbness which is slowly improving. He still denies claudication.     Current Meds  Medication Sig  . amLODipine (NORVASC) 10 MG tablet Take 10 mg by mouth daily.  [name] aspirin EC 81 MG tablet Take 81 mg by mouth daily.  [name] atorvastatin (LIPITOR) 80 MG tablet Take 80 mg by mouth daily.   . canagliflozin (INVOKANA) 100 MG TABS tablet Take 100 mg by mouth daily.  [name] enoxaparin (LOVENOX) 80 MG/0.8ML injection Inject 0.8 mLs (80 mg total) into the skin every 12 (twelve) hours.  [name] glyBURIDE (DIABETA) 2.5  MG tablet Take 5 mg by mouth 2 (two) times daily with a meal.   . hydrochlorothiazide (MICROZIDE) 12.5 MG capsule TAKE ONE CAPSULE BY MOUTH EVERY DAY  . metoprolol succinate (TOPROL-XL) 50 MG 24 hr tablet Take 1 tablet (50 mg total) by mouth daily. Take with or immediately  following a meal.  . omega-3 acid ethyl esters (LOVAZA) 1 g capsule Take 1 capsule (1 g total) by mouth 2 (two) times daily.  . sildenafil (REVATIO) 20 MG tablet Take 100 mg by mouth daily as needed (erectile dysfunction).   . trandolapril (MAVIK) 4 MG tablet Take 1 tablet (4 mg total) by mouth daily.  [name] warfarin (COUMADIN) 10 MG tablet Take 1 tablet (10 mg total) by mouth daily at 6 PM.     No Known Allergies  Social History   Socioeconomic History  . Marital status: Divorced    Spouse name: Not on file  . Number of children: 3  . Years of education: Not on file  . Highest education level: Not on file  Social Needs  . Financial resource strain: Not on file  . Food insecurity - worry: Not on file  . Food insecurity - inability: Not on file  . Transportation needs - medical: Not on file  . Transportation needs - non-medical: Not on file  Occupational History  . Occupation: Retired  Tobacco Use  . Smoking status: Former Smoker    Years: 30.00    Types: Cigarettes, Cigars    Last attempt to quit: 12/15/1993    Years since quitting: 24.0  . Smokeless tobacco: Never Used  Substance and Sexual Activity  . Alcohol use: Yes    Alcohol/week: 25.2 oz    Types: 28 Shots of liquor, 14 Standard drinks or equivalent per week    Comment: drinks 2 martini's a night (2 shots in each)  . Drug use: No  . Sexual activity: Yes  Other Topics Concern  . Not on file  Social History Narrative   Tries to remain active.  Frequent golfer but claudication limits this.     Review of Systems: General: negative for chills, fever, night sweats or weight changes.  Cardiovascular: negative for chest pain, dyspnea on exertion, edema, orthopnea, palpitations, paroxysmal nocturnal dyspnea or shortness of breath Dermatological: negative for rash Respiratory: negative for cough or wheezing Urologic: negative for hematuria Abdominal: negative for nausea, vomiting, diarrhea, bright red blood per rectum,  melena, or hematemesis Neurologic: negative for visual changes, syncope, or dizziness All other systems reviewed and are otherwise negative except as noted above.    Blood pressure 126/72, pulse 69, height 5\' 8"  (1.727 m), weight 173 lb (78.5 kg), SpO2 96 %.  General appearance: alert and no distress Neck: no adenopathy, no JVD, supple, symmetrical, trachea midline, thyroid not enlarged, symmetric, no tenderness/mass/nodules and Right carotid bruit Lungs: clear to auscultation bilaterally Heart: regular rate and rhythm, S1, S2 normal, no murmur, click, rub or gallop and Crisp prosthetic valve sounds Extremities: extremities normal, atraumatic, no cyanosis or edema Pulses: 2+ and symmetric Skin: Skin color, texture, turgor normal. No rashes or lesions Neurologic: Alert and oriented X 3, normal strength and tone. Normal symmetric reflexes. Normal coordination and gait  EKG not performed today  ASSESSMENT AND PLAN:   PVD (peripheral vascular disease) with claudication [facility] Mr. Lard returns today after recently discharged from the hospital with a stroke secondary to subtherapeutic INR. He is status post ostial and mid right SFA intervention by myself 10/01/16 with subsequent  Dopplers performed 06/23/17 revealing widely patent SFA. He denies claudication.  Renal artery arteriosclerosis History of renal artery stenosis status post left renal artery stenting by myself 02/21/15. His blood pressures have been under good control. His last renal Doppler study performed 07/15/16 revealed the stent to be widely patent. We will repeat check renal Doppler studies.      Lorretta Harp MD FACP,FACC,FAHA, [facility] 12/24/2017 2:01 PM

## 2017-12-24 NOTE — Assessment & Plan Note (Signed)
[name] [name] returns today after recently discharged from the hospital with a stroke secondary to subtherapeutic INR. He is status post ostial and mid right SFA intervention by myself 10/01/16 with subsequent Dopplers performed 06/23/17 revealing widely patent SFA. He denies claudication.

## 2017-12-24 NOTE — Assessment & Plan Note (Signed)
History of renal artery stenosis status post left renal artery stenting by myself 02/21/15. His blood pressures have been under good control. His last renal Doppler study performed 07/15/16 revealed the stent to be widely patent. We will repeat check renal Doppler studies.

## 2017-12-24 NOTE — Patient Instructions (Signed)
Medication Instructions: Your physician recommends that you continue on your current medications as directed. Please refer to the Current Medication list given to you today.   Testing/Procedures:  IN July: Your physician has requested that you have a renal artery duplex. During this test, an ultrasound is used to evaluate blood flow to the kidneys. Allow one hour for this exam. Do not eat after midnight the day before and avoid carbonated beverages. Take your medications as you usually do.  Your physician has requested that you have a lower extremity arterial duplex. During this test, ultrasound is used to evaluate arterial blood flow in the legs. Allow one hour for this exam. There are no restrictions or special instructions.  Your physician has requested that you have an ankle brachial index (ABI). During this test an ultrasound and blood pressure cuff are used to evaluate the arteries that supply the arms and legs with blood. Allow thirty minutes for this exam. There are no restrictions or special instructions.  Follow-Up: Your physician recommends that you schedule a follow-up appointment in: 1-2 weeks with Dr. Johnsie Cancel.  Your physician wants you to follow-up in: 1 year with Dr. Gwenlyn Found. You will receive a reminder letter in the mail two months in advance. If you don't receive a letter, please call our office to schedule the follow-up appointment.  If you need a refill on your cardiac medications before your next appointment, please call your pharmacy.

## 2017-12-27 ENCOUNTER — Ambulatory Visit (INDEPENDENT_AMBULATORY_CARE_PROVIDER_SITE_OTHER): Payer: Medicare Other | Admitting: *Deleted

## 2017-12-27 DIAGNOSIS — Z5181 Encounter for therapeutic drug level monitoring: Secondary | ICD-10-CM

## 2017-12-27 DIAGNOSIS — G459 Transient cerebral ischemic attack, unspecified: Secondary | ICD-10-CM

## 2017-12-27 DIAGNOSIS — I359 Nonrheumatic aortic valve disorder, unspecified: Secondary | ICD-10-CM | POA: Diagnosis not present

## 2017-12-27 LAB — POCT INR: INR: 3.3

## 2017-12-27 NOTE — Patient Instructions (Signed)
Description   Stop taking Lovenox injections. Continue taking 10mg  daily except 5mg  on Mondays and Fridays. Recheck INR in 1 week.

## 2018-01-03 ENCOUNTER — Ambulatory Visit (INDEPENDENT_AMBULATORY_CARE_PROVIDER_SITE_OTHER): Payer: Medicare Other | Admitting: *Deleted

## 2018-01-03 ENCOUNTER — Other Ambulatory Visit: Payer: Self-pay

## 2018-01-03 DIAGNOSIS — G459 Transient cerebral ischemic attack, unspecified: Secondary | ICD-10-CM

## 2018-01-03 DIAGNOSIS — Z5181 Encounter for therapeutic drug level monitoring: Secondary | ICD-10-CM | POA: Diagnosis not present

## 2018-01-03 DIAGNOSIS — I359 Nonrheumatic aortic valve disorder, unspecified: Secondary | ICD-10-CM | POA: Diagnosis not present

## 2018-01-03 LAB — POCT INR: INR: 3.4

## 2018-01-03 NOTE — Patient Outreach (Signed)
[facility]) Care Management  01/03/2018  Jenna Ardoin [birth year] [id number]   EMMI:  Referral date: 01/03/18 Referral source: EMMI stroke red alert Referral reason: [name] [name] to follow up appointment Day # 13  Telephone call to patient regarding EMMI stroke red alert.  HIPAA verified with patient. Discussed EMMI stroke program with patient.  Patient states he has not had a follow up visit with his primary  MD.  States Dr. Virgina Jock is not available. Patient states he was scheduled to see the nurse practitioner but was unable to keep appointment due to going back to the hospital.  RNCM advised patient to call his primary MD office to reschedule appointment. Patient verbalized understanding. Patient states he has been seen at the coumadin clinic x2 and has a follow up appointment today. Patient states he has an appointment scheduled with the neurologist for March 2019.   Patient states he has all of his medications and is taking them as prescribed. Patient states he is not having any issues and does not need anything. RNCM reviewed stroke symptoms with patient. Advised to call 911 for stroke like symptoms.  RNCM gave patient 24 hour nurse line number. RNCM advised patient to notify MD of any changes in condition prior to scheduled appointment. RNCM verified patient aware of 911 services for urgent/ emergent needs. Patient verbally agreed to receive [facility] care management letter/ brochure.   PLAN: RNCM will refer patient to care management assistant to close patient due  To patient being assessed and having no further needs.  RNCM will send patient outreach letter/ brochure as discussed.   Quinn Plowman RN,BSN,CCM [facility] [place] Telephonic  [phone]

## 2018-01-03 NOTE — Patient Instructions (Addendum)
Description    Continue taking 10mg  daily except 5mg  on Mondays and Fridays. Recheck INR in one week.

## 2018-01-06 DIAGNOSIS — I639 Cerebral infarction, unspecified: Secondary | ICD-10-CM | POA: Diagnosis not present

## 2018-01-06 DIAGNOSIS — E1122 Type 2 diabetes mellitus with diabetic chronic kidney disease: Secondary | ICD-10-CM | POA: Diagnosis not present

## 2018-01-06 DIAGNOSIS — Z7901 Long term (current) use of anticoagulants: Secondary | ICD-10-CM | POA: Diagnosis not present

## 2018-01-06 DIAGNOSIS — I10 Essential (primary) hypertension: Secondary | ICD-10-CM | POA: Diagnosis not present

## 2018-01-06 DIAGNOSIS — E7849 Other hyperlipidemia: Secondary | ICD-10-CM | POA: Diagnosis not present

## 2018-01-06 DIAGNOSIS — R51 Headache: Secondary | ICD-10-CM | POA: Diagnosis not present

## 2018-01-06 DIAGNOSIS — R519 Headache, unspecified: Secondary | ICD-10-CM | POA: Insufficient documentation

## 2018-01-06 DIAGNOSIS — Z6827 Body mass index (BMI) 27.0-27.9, adult: Secondary | ICD-10-CM | POA: Diagnosis not present

## 2018-01-11 ENCOUNTER — Ambulatory Visit (INDEPENDENT_AMBULATORY_CARE_PROVIDER_SITE_OTHER): Payer: Medicare Other

## 2018-01-11 ENCOUNTER — Ambulatory Visit (INDEPENDENT_AMBULATORY_CARE_PROVIDER_SITE_OTHER): Payer: Medicare Other | Admitting: Nurse Practitioner

## 2018-01-11 ENCOUNTER — Encounter: Payer: Self-pay | Admitting: Nurse Practitioner

## 2018-01-11 VITALS — BP 180/80 | HR 72 | Ht 68.0 in | Wt 173.1 lb

## 2018-01-11 DIAGNOSIS — Z952 Presence of prosthetic heart valve: Secondary | ICD-10-CM | POA: Diagnosis not present

## 2018-01-11 DIAGNOSIS — I259 Chronic ischemic heart disease, unspecified: Secondary | ICD-10-CM

## 2018-01-11 DIAGNOSIS — I359 Nonrheumatic aortic valve disorder, unspecified: Secondary | ICD-10-CM

## 2018-01-11 DIAGNOSIS — Z7901 Long term (current) use of anticoagulants: Secondary | ICD-10-CM

## 2018-01-11 DIAGNOSIS — Z5181 Encounter for therapeutic drug level monitoring: Secondary | ICD-10-CM

## 2018-01-11 DIAGNOSIS — I10 Essential (primary) hypertension: Secondary | ICD-10-CM

## 2018-01-11 DIAGNOSIS — G459 Transient cerebral ischemic attack, unspecified: Secondary | ICD-10-CM

## 2018-01-11 LAB — POCT INR: INR: 3.6

## 2018-01-11 MED ORDER — WARFARIN SODIUM 10 MG PO TABS: ORAL_TABLET | ORAL | 3 refills | Status: DC

## 2018-01-11 NOTE — Patient Instructions (Signed)
Description    Continue taking 10mg  daily except 5mg  on Mondays and Fridays. Recheck INR in 10 days.

## 2018-01-11 NOTE — Progress Notes (Signed)
CARDIOLOGY OFFICE NOTE  Date:  01/11/2018    [name] [name] Date of Birth: 31-Jan-1946 Medical Record #[id number]  PCP:  Shon Baton, MD  Cardiologist:  Gwenlyn Found  Chief Complaint  Patient presents with  . Coronary Artery Disease  . Hypertension  . Hyperlipidemia    Follow up visit - seen for Dr. Johnsie Cancel    History of Present Illness: [name] [name] is a 72 y.o. male who presents today for a follow up visit.  Apparently has seen Dr. Johnsie Cancel in the past - last in March 2017.   He has a history of type 2 diabetes, stroke, PVD, hypertension, hyperlipidemia, ITP, claudication, aortic stenosis with mechanicalaortic valve replacement from 2004 and CABG due to CAD in 2004,with a recent stroke in the setting of subtherapeutic INR on Coumadin. He has had extensive PAD with bilateral renal artery stenosis, prior aortobifemoral bypass, left external iliac stenting and left common femoral endarterectomy. He has seen Dr. Donnetta Hutching as well.   He typically does lovenox bridge.   He was admitted last month with headache, confusion.His blood pressure was elevated. CT head ruled out hemorrhage. However given his high risk for having another stroke,especially in the setting of subtherapeutic INR of 1.9(goal is 2.5-3.5),he had an MRI brain to rule out stroke. This apparently showed a small left frontal CVA.   He was seen just a few days later by Dr. Gwenlyn Found.   Comes in today. Here alone. Was under the impression that he was seeing Dr. Johnsie Cancel today and not me. He is a little upset about the recent events with our office. Says he was told to see Dr. Johnsie Cancel due to the recent stroke. To go to Coumadin today. BP is high - says that "that's wrong". Better readings at other offices but does not check at home and he likes salt. No chest pain. Breathing is ok. No swelling. Still with some mouth/facial droop but no other real residual. He is back playing golf. He has no real concerns. Echo was updated  last month - valve ok. EKG with NSR.   Past Medical History:  Diagnosis Date  . Adenomatous colon polyp 09/1997  . Anemia   . Aortic stenosis    s/p st. jude mechanical AVR - Chronic Coumadin  . Blood transfusion    "related to ITP"  . Coronary artery disease    s/p cabg x 3 11/2003: lima-lad, seq vg to rpda and rpl  . Diverticulitis of colon   . Heart murmur   . Hyperlipidemia   . Hypertension   . ITP (idiopathic thrombocytopenic purpura)   . Peripheral arterial disease ([place])    a. history of aortobifemoral bypass grafting by Dr. Sherren Mocha early b. LE angiography 04/22/2015 patent aortobifem graft, DES to R SFA  . Peripheral vascular disease ([place])    s/p Left external Iliac Artery stenting and subsequent left femoral endarterectomy 02/2011- post op course complicated by wound infxn req I&D 03/2011  . Renal artery stenosis, native, bilateral (H[facility])    a. bilateral renal artery stenosis by recent duplex ultrasound b. L renal artery stent 02/2015, R renal artery patent on angiogram  . Stroke ([place])   . TIA (transient ischemic attack) ~ 2013  . Type II diabetes mellitus ([place])     Past Surgical History:  Procedure Laterality Date  . ABDOMINAL AORTAGRAM N/A 12/16/2011   Procedure: ABDOMINAL Maxcine Ham;  Surgeon: Sherren Mocha, MD;  Location: [facility] CATH LAB;  Service: Cardiovascular;  Laterality: N/A;  .  ANGIOPLASTY / STENTING ILIAC     Left external Iliac Artery  . AORTA - BILATERAL FEMORAL ARTERY BYPASS GRAFT  01/18/2012   Procedure: AORTA BIFEMORAL BYPASS GRAFT;  Surgeon: Curt Jews, MD;  Location: [facility];  Service: Vascular;  Laterality: N/A;  . AORTIC VALVE REPLACEMENT  ~ 2004  . CARDIAC CATHETERIZATION  11/2003   /pt report 10/01/2016  . CARDIAC VALVE REPLACEMENT  11/2003   aortic  . CATARACT EXTRACTION W/ INTRAOCULAR LENS  IMPLANT, BILATERAL Bilateral   . CORONARY ARTERY BYPASS GRAFT  11/2003   Archie Endo 04/21/2011  . LOWER EXTREMITY ANGIOGRAM N/A 02/21/2015   Procedure: LOWER EXTREMITY  ANGIOGRAM;  Surgeon: Lorretta Harp, MD;  Location[facility] CATH LAB;  Service: Cardiovascular;  Laterality: N/A;  . PERIPHERAL VASCULAR CATHETERIZATION N/A 04/22/2015   Procedure: Lower Extremity Angiography;  Surgeon: Lorretta Harp, MD;  Location: [place] CV LAB;  Service: Cardiovascular;  Laterality: N/A;  . PERIPHERAL VASCULAR CATHETERIZATION N/A 08/24/2016   Procedure: Lower Extremity Angiography;  Surgeon: Lorretta Harp, MD;  Location: [place] CV LAB;  Service: Cardiovascular;  Laterality: N/A;  . PERIPHERAL VASCULAR CATHETERIZATION Right 10/01/2016   Procedure: Peripheral Vascular Intervention - STENT;  Surgeon: Lorretta Harp, MD;  Location: [place] CV LAB;  Service: Cardiovascular;  Laterality: Right;  Prox and MID SFA   . RENAL ANGIOGRAM N/A 02/21/2015   Procedure: RENAL ANGIOGRAM;  Surgeon: Lorretta Harp, MD;  Location: [facility] CATH LAB;  Service: Cardiovascular;  Laterality: Bilateral; 6 mm x 12 mm long Herculink balloon expandable stent to the left renal artery  . RENAL ARTERY STENT Left 04/22/2015   dr berry  . SPLENECTOMY  02/2003   Archie Endo 04/21/2011  . TONSILLECTOMY  ~ 1952     Medications: Current Meds  Medication Sig  . amLODipine (NORVASC) 10 MG tablet Take 10 mg by mouth daily.  [name] aspirin EC 81 MG tablet Take 81 mg by mouth daily.  [name] atorvastatin (LIPITOR) 80 MG tablet Take 80 mg by mouth daily.   . canagliflozin (INVOKANA) 100 MG TABS tablet Take 100 mg by mouth daily.  [name] glyBURIDE (DIABETA) 2.5 MG tablet Take 5 mg by mouth 2 (two) times daily with a meal.   . hydrochlorothiazide (MICROZIDE) 12.5 MG capsule TAKE ONE CAPSULE BY MOUTH EVERY DAY  . metoprolol succinate (TOPROL-XL) 50 MG 24 hr tablet Take 1 tablet (50 mg total) by mouth daily. Take with or immediately following a meal.  . omega-3 acid ethyl esters (LOVAZA) 1 g capsule Take 1 capsule (1 g total) by mouth 2 (two) times daily.  . sildenafil (REVATIO) 20 MG tablet Take 100 mg by mouth daily as  needed (erectile dysfunction).   . trandolapril (MAVIK) 4 MG tablet Take 1 tablet (4 mg total) by mouth daily.  . [DISCONTINUED] warfarin (COUMADIN) 10 MG tablet Take 1 tablet (10 mg total) by mouth daily at 6 PM.     Allergies: No Known Allergies  Social History: The patient  reports that he quit smoking about 24 years ago. His smoking use included cigarettes and cigars. He quit after 30.00 years of use. he has never used smokeless tobacco. He reports that he drinks about 25.2 oz of alcohol per week. He reports that he does not use drugs.   Family History: The patient's family history includes Breast cancer in his sister; Coronary artery disease in his mother; Diabetes in his paternal aunt, paternal grandmother, and unknown relative; Heart disease in his father.  Review of Systems: Please see the history of present illness.   Otherwise, the review of systems is positive for none.   All other systems are reviewed and negative.   Physical Exam: VS:  BP (!) 180/80 (BP Location: Left Arm, Patient Position: Sitting, Cuff Size: Normal)   Pulse 72   Ht 5\' 8"  (1.727 m)   Wt 173 lb 1.9 oz (78.5 kg)   SpO2 94% Comment: at rest  BMI 26.32 kg/m  .  BMI Body mass index is 26.32 kg/m.  Wt Readings from Last 3 Encounters:  01/11/18 173 lb 1.9 oz (78.5 kg)  12/24/17 173 lb (78.5 kg)  12/13/17 165 lb (74.8 kg)   BP recheck by me in the left arm is 140/80 General: Pleasant. Well developed, well nourished and in no acute distress.   HEENT: Normal.  Neck: Supple, no JVD, carotid bruits, or masses noted.  Cardiac: Regular rate and rhythm. No murmurs, rubs, or gallops. No edema.  Respiratory:  Lungs are clear to auscultation bilaterally with normal work of breathing.  GI: Soft and nontender.  MS: No deformity or atrophy. Gait and ROM intact.  Skin: Warm and dry. Color is normal.  Neuro:  Strength and sensation are intact and no gross focal deficits noted.  Psych: Alert, appropriate and with  normal affect.   LABORATORY DATA:  EKG:  EKG is not ordered today.  Lab Results  Component Value Date   WBC 6.6 12/21/2017   HGB 15.7 12/21/2017   HCT 47.1 12/21/2017   PLT 191 12/21/2017   GLUCOSE 123 (H) 12/21/2017   CHOL 178 12/21/2017   TRIG 121 12/21/2017   HDL 54 12/21/2017   LDLCALC 100 (H) 12/21/2017   ALT 61 12/21/2017   AST 38 12/21/2017   NA 137 12/21/2017   K 3.7 12/21/2017   CL 101 12/21/2017   CREATININE 0.87 12/21/2017   BUN 15 12/21/2017   CO2 25 12/21/2017   TSH 2.178 12/13/2017   INR 3.6 01/11/2018   HGBA1C 6.7 (H) 12/21/2017   Lab Results  Component Value Date   INR 3.6 01/11/2018   INR 3.4 01/03/2018   INR 3.3 12/27/2017   PROTIME 16.1 05/22/2009   PROTIME 23.6 05/08/2009     BNP (last 3 results) No results for input(s): BNP in the last 8760 hours.  ProBNP (last 3 results) No results for input(s): PROBNP in the last 8760 hours.   Other Studies Reviewed Today:  Echo Study Conclusions 12/2017  - Left ventricle: The cavity size was normal. Wall thickness was   increased in a pattern of moderate LVH. Systolic function was   normal. The estimated ejection fraction was in the range of 60%   to 65%. Left ventricular diastolic function parameters were   normal. - Aortic valve: Normal appearing mechanical AVR with no peri   valvular regurgitation Valve area (VTI): 1.45 cm^2. Valve area   (Vmax): 1.11 cm^2. Valve area (Vmean): 1.32 cm^2. - Mitral valve: Calcified annulus. Mildly thickened leaflets .   Valve area by pressure half-time: 1.22 cm^2. - Atrial septum: No defect or patent foramen ovale was identified.  Assessment/Plan:  1. Recent stroke from HTN/subtherapeutic INR - very little residual - doing well..  2. HTN - BP recheck by me is ok. Was good at Dr. Kennon Holter visit. I have asked him to monitor. Advised salt restriction as well.   3. CAD/AS with remote CABG/AVR - otherwise doing well. He has had a recent echo - he does SBE.  4.  Chronic anticoagulation - INR today.   5. PAD - followed by Dr. Gwenlyn Found and Early.   6. HLD - on statin therapy  Current medicines are reviewed with the patient today.  The patient does not have concerns regarding medicines other than what has been noted above.  The following changes have been made:  See above.  Labs/ tests ordered today include:   No orders of the defined types were placed in this encounter.    Disposition:   FU with Dr. Johnsie Cancel in 6 months.   Patient is agreeable to this plan and will call if any problems develop in the interim.   SignedTruitt Merle, NP  01/11/2018 2:12 PM  [facility] [address] [place], [place]  [address] Phone: [phone] Fax: [phone]

## 2018-01-11 NOTE — Patient Instructions (Addendum)
We will be checking the following labs today - NONE   Medication Instructions:    Continue with your current medicines.     Testing/Procedures To Be Arranged:  N/A  Follow-Up:   See Dr. Johnsie Cancel in 6 months.     Other Special Instructions:   Keep a check on your BP for [place].     If you need a refill on your cardiac medications before your next appointment, please call your pharmacy.   Call the [place] office at [phone] if you have any questions, problems or concerns.

## 2018-01-15 ENCOUNTER — Other Ambulatory Visit: Payer: Self-pay | Admitting: Cardiovascular Disease

## 2018-01-17 MED ORDER — WARFARIN SODIUM 10 MG PO TABS: ORAL_TABLET | ORAL | 3 refills | Status: DC

## 2018-01-21 ENCOUNTER — Ambulatory Visit (INDEPENDENT_AMBULATORY_CARE_PROVIDER_SITE_OTHER): Payer: Medicare Other

## 2018-01-21 DIAGNOSIS — G459 Transient cerebral ischemic attack, unspecified: Secondary | ICD-10-CM

## 2018-01-21 DIAGNOSIS — I359 Nonrheumatic aortic valve disorder, unspecified: Secondary | ICD-10-CM | POA: Diagnosis not present

## 2018-01-21 DIAGNOSIS — Z5181 Encounter for therapeutic drug level monitoring: Secondary | ICD-10-CM

## 2018-01-21 LAB — POCT INR: INR: 3.2

## 2018-01-21 NOTE — Patient Instructions (Signed)
Description    Continue taking 10mg  daily except 5mg  on Mondays and Fridays. Recheck INR in 2 weeks.

## 2018-01-25 DIAGNOSIS — D1801 Hemangioma of skin and subcutaneous tissue: Secondary | ICD-10-CM | POA: Diagnosis not present

## 2018-01-25 DIAGNOSIS — L570 Actinic keratosis: Secondary | ICD-10-CM | POA: Diagnosis not present

## 2018-02-02 ENCOUNTER — Other Ambulatory Visit: Payer: Self-pay | Admitting: Cardiovascular Disease

## 2018-02-02 NOTE — Telephone Encounter (Signed)
REFILL 

## 2018-02-03 DIAGNOSIS — I10 Essential (primary) hypertension: Secondary | ICD-10-CM | POA: Diagnosis not present

## 2018-02-03 DIAGNOSIS — R82998 Other abnormal findings in urine: Secondary | ICD-10-CM | POA: Diagnosis not present

## 2018-02-03 DIAGNOSIS — E7849 Other hyperlipidemia: Secondary | ICD-10-CM | POA: Diagnosis not present

## 2018-02-03 DIAGNOSIS — E1151 Type 2 diabetes mellitus with diabetic peripheral angiopathy without gangrene: Secondary | ICD-10-CM | POA: Diagnosis not present

## 2018-02-03 DIAGNOSIS — Z125 Encounter for screening for malignant neoplasm of prostate: Secondary | ICD-10-CM | POA: Diagnosis not present

## 2018-02-04 ENCOUNTER — Ambulatory Visit (INDEPENDENT_AMBULATORY_CARE_PROVIDER_SITE_OTHER): Payer: Medicare Other | Admitting: *Deleted

## 2018-02-04 DIAGNOSIS — I359 Nonrheumatic aortic valve disorder, unspecified: Secondary | ICD-10-CM | POA: Diagnosis not present

## 2018-02-04 DIAGNOSIS — Z5181 Encounter for therapeutic drug level monitoring: Secondary | ICD-10-CM

## 2018-02-04 DIAGNOSIS — G459 Transient cerebral ischemic attack, unspecified: Secondary | ICD-10-CM | POA: Diagnosis not present

## 2018-02-04 LAB — POCT INR: INR: 3.2

## 2018-02-04 NOTE — Patient Instructions (Signed)
Description    Continue taking 10mg  daily except 5mg  on Mondays and Fridays. Recheck INR in 4 weeks. Call us with any medication changes or concerns Coumadin Clinic [phone], Main # [phone].

## 2018-02-07 DIAGNOSIS — N182 Chronic kidney disease, stage 2 (mild): Secondary | ICD-10-CM | POA: Diagnosis not present

## 2018-02-07 DIAGNOSIS — Z7901 Long term (current) use of anticoagulants: Secondary | ICD-10-CM | POA: Diagnosis not present

## 2018-02-07 DIAGNOSIS — I701 Atherosclerosis of renal artery: Secondary | ICD-10-CM | POA: Diagnosis not present

## 2018-02-07 DIAGNOSIS — E1122 Type 2 diabetes mellitus with diabetic chronic kidney disease: Secondary | ICD-10-CM | POA: Diagnosis not present

## 2018-02-07 DIAGNOSIS — I700 Atherosclerosis of aorta: Secondary | ICD-10-CM | POA: Diagnosis not present

## 2018-02-07 DIAGNOSIS — Z1389 Encounter for screening for other disorder: Secondary | ICD-10-CM | POA: Diagnosis not present

## 2018-02-07 DIAGNOSIS — J449 Chronic obstructive pulmonary disease, unspecified: Secondary | ICD-10-CM | POA: Diagnosis not present

## 2018-02-07 DIAGNOSIS — Z0000 Encounter for general adult medical examination without abnormal findings: Secondary | ICD-10-CM | POA: Diagnosis not present

## 2018-02-07 DIAGNOSIS — Z6827 Body mass index (BMI) 27.0-27.9, adult: Secondary | ICD-10-CM | POA: Diagnosis not present

## 2018-02-07 DIAGNOSIS — E1151 Type 2 diabetes mellitus with diabetic peripheral angiopathy without gangrene: Secondary | ICD-10-CM | POA: Diagnosis not present

## 2018-02-07 DIAGNOSIS — I1310 Hypertensive heart and chronic kidney disease without heart failure, with stage 1 through stage 4 chronic kidney disease, or unspecified chronic kidney disease: Secondary | ICD-10-CM | POA: Diagnosis not present

## 2018-02-07 DIAGNOSIS — I69998 Other sequelae following unspecified cerebrovascular disease: Secondary | ICD-10-CM | POA: Diagnosis not present

## 2018-02-09 DIAGNOSIS — Z1212 Encounter for screening for malignant neoplasm of rectum: Secondary | ICD-10-CM | POA: Diagnosis not present

## 2018-02-14 ENCOUNTER — Other Ambulatory Visit: Payer: Self-pay | Admitting: Cardiovascular Disease

## 2018-02-14 NOTE — Telephone Encounter (Signed)
REFILL 

## 2018-02-17 ENCOUNTER — Ambulatory Visit (INDEPENDENT_AMBULATORY_CARE_PROVIDER_SITE_OTHER): Payer: Medicare Other | Admitting: Neurology

## 2018-02-17 ENCOUNTER — Encounter: Payer: Self-pay | Admitting: Neurology

## 2018-02-17 VITALS — BP 146/57 | HR 66 | Ht 68.0 in | Wt 174.0 lb

## 2018-02-17 DIAGNOSIS — I259 Chronic ischemic heart disease, unspecified: Secondary | ICD-10-CM | POA: Diagnosis not present

## 2018-02-17 DIAGNOSIS — I63412 Cerebral infarction due to embolism of left middle cerebral artery: Secondary | ICD-10-CM | POA: Diagnosis not present

## 2018-02-17 NOTE — Progress Notes (Signed)
[facility] [address] [place]. [place] [address] [phone]       OFFICE FOLLOW-UP NOTE  [name]. [name] [name] Date of Birth:  1945-12-23 Medical Record Number:  [id number]   HPI: [name] [name] is a pleasant 72 year old Caucasian male seen today for first office follow-up visit following hospital admission for strokein January 2019. History is obtained from the patient, review of electronic medical records and have personally reviewed imaging films.[name] [name] is an 72 y.o. male with history of type 2 diabetes, stroke, PVD, hypertension, hyperlipidemia, ITP, claudication, aortic stenosis, mechanical aortic valve replacement on Coumadin recently discharged from the hospital on 12/18/17 for a left fronto-parietal infarct due to subtherapeutic anticoagulation with INR of 1.29. He was bridged with heparin and Warfarin dose was increased with INR of 2.5 at time of discharge. Deficits from the stroke includes right facial droop, right side weakness and mild dysarthria. MRI brain showed 1 cm acute infarction affecting left frontoparietal gyrus. LDL 92, A1c 6.8. Carotid Dopplers: No significant bilateral carotid stenosis. 2-D echo: LVEF 60-65 percent. No report of clots on AV Patient presented again on 12/20/17 after having a severe headache this afternoon after coming from from the gym. He also felt extremely disoriented which gradually improved. His blood pressure on arrival was 192/53 with repeat 182/88 mmHg. his headache is now gradually improved and patient no longer feels confused. Date last known well: 1.14. 19 Time last known well: Around 1 PM No TPA given as he is outside window and mild resolving symptoms NIHSS: 2 Baseline MRS 1  The patient's repeat INRon 12/20/17 was 1.9. Repeat MRI scan did not show an acute stroke. Patient blood pressure was high. He is admitted with tight blood pressure control and did well. He states his done well since discharge. His last INR  checked 2 weeks ago was 3.2. His tolerating warfarin without bleeding or bruising and has been on it for 10 years now. He is tolerating Lipitor 80 mg well without muscle aches and pains. States her blood pressure is well controlled at home though today it is slightly elevated 146/57. He states his speech has recovered almost back to baseline. Only occasionally he may notice some right facial sagging as well as some word finding difficulties but most of his friends cannot even tell that his had a stroke.     ROS:   14 system review of systems is positive for  Frequent waking only and all other systems negative PMH:  Past Medical History:  Diagnosis Date  . Adenomatous colon polyp 09/1997  . Anemia   . Aortic stenosis    s/p [facility] mechanical AVR - Chronic Coumadin  . Blood transfusion    "related to ITP"  . Coronary artery disease    s/p cabg x 3 11/2003: lima-lad, seq vg to rpda and rpl  . Diverticulitis of colon   . Heart murmur   . Hyperlipidemia   . Hypertension   . ITP (idiopathic thrombocytopenic purpura)   . Peripheral arterial disease ([place])    a. history of aortobifemoral bypass grafting by Dr. Sherren Mocha early b. LE angiography 04/22/2015 patent aortobifem graft, DES to R SFA  . Peripheral vascular disease ([place])    s/p Left external Iliac Artery stenting and subsequent left femoral endarterectomy 02/2011- post op course complicated by wound infxn req I&D 03/2011  . Renal artery stenosis, native, bilateral ([facility])    a. bilateral renal artery stenosis by recent duplex ultrasound b. L renal artery stent  02/2015, R renal artery patent on angiogram  . Stroke ([place])   . TIA (transient ischemic attack) ~ 2013  . Type II diabetes mellitus ([place])     Social History:  Social History   Socioeconomic History  . Marital status: Divorced    Spouse name: Not on file  . Number of children: 3  . Years of education: Not on file  . Highest education level: Not on file  Social Needs  .  Financial resource strain: Not on file  . Food insecurity - worry: Not on file  . Food insecurity - inability: Not on file  . Transportation needs - medical: Not on file  . Transportation needs - non-medical: Not on file  Occupational History  . Occupation: Retired  Tobacco Use  . Smoking status: Former Smoker    Years: 30.00    Types: Cigarettes, Cigars    Last attempt to quit: 12/15/1993    Years since quitting: 24.1  . Smokeless tobacco: Never Used  Substance and Sexual Activity  . Alcohol use: Yes    Alcohol/week: 9.6 oz    Types: 14 Standard drinks or equivalent, 1 Cans of beer, 1 Shots of liquor per week    Comment: drinks 2 martini's a night (2 shots in each)  . Drug use: No  . Sexual activity: Yes  Other Topics Concern  . Not on file  Social History Narrative   Tries to remain active.  Frequent golfer but claudication limits this.    Medications:   Current Outpatient Medications on File Prior to Visit  Medication Sig Dispense Refill  . amLODipine (NORVASC) 10 MG tablet Take 10 mg by mouth daily.  5  . aspirin EC 81 MG tablet Take 81 mg by mouth daily.    [name] atorvastatin (LIPITOR) 80 MG tablet Take 80 mg by mouth daily.     . canagliflozin (INVOKANA) 100 MG TABS tablet Take 100 mg by mouth daily.    [name] glyBURIDE (DIABETA) 2.5 MG tablet Take 5 mg by mouth 2 (two) times daily with a meal.     . hydrochlorothiazide (MICROZIDE) 12.5 MG capsule TAKE ONE CAPSULE BY MOUTH EVERY DAY 90 capsule 3  . metoprolol succinate (TOPROL-XL) 50 MG 24 hr tablet TAKE 1 TABLET (50 MG TOTAL) BY MOUTH DAILY. TAKE WITH OR IMMEDIATELY FOLLOWING A MEAL. 30 tablet 5  . omega-3 acid ethyl esters (LOVAZA) 1 g capsule Take 1 capsule (1 g total) by mouth 2 (two) times daily. 60 capsule 0  . sildenafil (REVATIO) 20 MG tablet Take 100 mg by mouth daily as needed (erectile dysfunction).     . trandolapril (MAVIK) 4 MG tablet Take 1 tablet (4 mg total) by mouth daily. 30 tablet 6  . warfarin (COUMADIN) 10 MG  tablet Take as directed by Coumadin Clinic 35 tablet 3   No current facility-administered medications on file prior to visit.     Allergies:  No Known Allergies  Physical Exam General: well developed, well nourished, seated, in no evident distress Head: head normocephalic and atraumatic.  Neck: supple with no carotid or supraclavicular bruits Cardiovascular: regular rate and rhythm, no murmurs but mechanical click present Musculoskeletal: no deformity Skin:  no rash/petichiae Vascular:  Normal pulses all extremities Vitals:   02/17/18 0825  BP: (!) 146/57  Pulse: 66   Neurologic Exam Mental Status: Awake and fully alert. Oriented to place and time. Recent and remote memory intact. Attention span, concentration and fund of knowledge appropriate. Mood and affect appropriate.  Cranial Nerves: Fundoscopic exam reveals sharp disc margins. Pupils equal, briskly reactive to light. Extraocular movements full without nystagmus. Visual fields full to confrontation. Hearing intact. Facial sensation intact. Face, tongue, palate moves normally and symmetrically.  Motor: Normal bulk and tone. Normal strength in all tested extremity muscles. Sensory.: intact to touch ,pinprick .position and vibratory sensation.  Coordination: Rapid alternating movements normal in all extremities. Finger-to-nose and heel-to-shin performed accurately bilaterally. Gait and Station: Arises from chair without difficulty. Stance is normal. Gait demonstrates normal stride length and balance . Able to heel, toe and tandem walk with some   difficulty.  Reflexes: 1+ and symmetric. Toes downgoing.   NIHSS  0 Modified Rankin  1   ASSESSMENT: 72 year old Caucasian male with embolic left MCA branch infarct in January 2019 secondary to mechanical heart valve with suboptimal anticoagulation.Vascular risk factors of hyperlipidemia, hypertension, mechanical heart valve    PLAN: I had a long d/w patient about his recent embolic   stroke, mechanical heart valve,risk for recurrent stroke/TIAs, personally independently reviewed imaging studies and stroke evaluation results and answered questions.Continue warfarin daily  for secondary stroke prevention  With INR goal 2.5-3.5 and maintain strict control of hypertension with blood pressure goal below 130/90, diabetes with hemoglobin A1c goal below 6.5% and lipids with LDL cholesterol goal below 70 mg/dL. I also advised the patient to eat a healthy diet with plenty of whole grains, cereals, fruits and vegetables, exercise regularly and maintain ideal body weight Followup in the future with my nurse practitioner in 6 months or call earlier if needed Greater than 50% of time during this 25 minute visit was spent on counseling,explanation of diagnosis, planning of further management, discussion with patient and family and coordination of care Antony Contras, MD  [facility] [address] [place], [place] [address]  Phone [phone] Fax [phone] Note: This document was prepared with digital dictation and possible smart phrase technology. Any transcriptional errors that result from this process are unintentional

## 2018-02-17 NOTE — Patient Instructions (Signed)
I had a long d/w patient about his recent embolic  stroke, mechanical heart valve,risk for recurrent stroke/TIAs, personally independently reviewed imaging studies and stroke evaluation results and answered questions.Continue warfarin daily  for secondary stroke prevention  With INR goal 2.5-3.5 and maintain strict control of hypertension with blood pressure goal below 130/90, diabetes with hemoglobin A1c goal below 6.5% and lipids with LDL cholesterol goal below 70 mg/dL. I also advised the patient to eat a healthy diet with plenty of whole grains, cereals, fruits and vegetables, exercise regularly and maintain ideal body weight Followup in the future with my nurse practitioner in 6 months or call earlier if needed   Stroke Prevention Some medical conditions and behaviors are associated with a higher chance of having a stroke. You can help prevent a stroke by making nutrition, lifestyle, and other changes, including managing any medical conditions you may have. What nutrition changes can be made?  Eat healthy foods. You can do this by: ? Choosing foods high in fiber, such as fresh fruits and vegetables and whole grains. ? Eating at least 5 or more servings of fruits and vegetables a day. Try to fill half of your plate at each meal with fruits and vegetables. ? Choosing lean protein foods, such as lean cuts of meat, poultry without skin, fish, tofu, beans, and nuts. ? Eating low-fat dairy products. ? Avoiding foods that are high in salt (sodium). This can help lower blood pressure. ? Avoiding foods that have saturated fat, trans fat, and cholesterol. This can help prevent high cholesterol. ? Avoiding processed and premade foods.  Follow your health care provider's specific guidelines for losing weight, controlling high blood pressure (hypertension), lowering high cholesterol, and managing diabetes. These may include: ? Reducing your daily calorie intake. ? Limiting your daily sodium intake to 1,500  milligrams (mg). ? Using only healthy fats for cooking, such as olive oil, canola oil, or sunflower oil. ? Counting your daily carbohydrate intake. What lifestyle changes can be made?  Maintain a healthy weight. Talk to your health care provider about your ideal weight.  Get at least 30 minutes of moderate physical activity at least 5 days a week. Moderate activity includes brisk walking, biking, and swimming.  Do not use any products that contain nicotine or tobacco, such as cigarettes and e-cigarettes. If you need help quitting, ask your health care provider. It may also be helpful to avoid exposure to secondhand smoke.  Limit alcohol intake to no more than 1 drink a day for nonpregnant women and 2 drinks a day for men. One drink equals 12 oz of beer, 5 oz of wine, or 1 oz of hard liquor.  Stop any illegal drug use.  Avoid taking birth control pills. Talk to your health care provider about the risks of taking birth control pills if: ? You are over 43 years old. ? You smoke. ? You get migraines. ? You have ever had a blood clot. What other changes can be made?  Manage your cholesterol levels. ? Eating a healthy diet is important for preventing high cholesterol. If cholesterol cannot be managed through diet alone, you may also need to take medicines. ? Take any prescribed medicines to control your cholesterol as told by your health care provider.  Manage your diabetes. ? Eating a healthy diet and exercising regularly are important parts of managing your blood sugar. If your blood sugar cannot be managed through diet and exercise, you may need to take medicines. ? Take any prescribed  medicines to control your diabetes as told by your health care provider.  Control your hypertension. ? To reduce your risk of stroke, try to keep your blood pressure below 130/80. ? Eating a healthy diet and exercising regularly are an important part of controlling your blood pressure. If your blood  pressure cannot be managed through diet and exercise, you may need to take medicines. ? Take any prescribed medicines to control hypertension as told by your health care provider. ? Ask your health care provider if you should monitor your blood pressure at home. ? Have your blood pressure checked every year, even if your blood pressure is normal. Blood pressure increases with age and some medical conditions.  Get evaluated for sleep disorders (sleep apnea). Talk to your health care provider about getting a sleep evaluation if you snore a lot or have excessive sleepiness.  Take over-the-counter and prescription medicines only as told by your health care provider. Aspirin or blood thinners (antiplatelets or anticoagulants) may be recommended to reduce your risk of forming blood clots that can lead to stroke.  Make sure that any other medical conditions you have, such as atrial fibrillation or atherosclerosis, are managed. What are the warning signs of a stroke? The warning signs of a stroke can be easily remembered as BEFAST.  B is for balance. Signs include: ? Dizziness. ? Loss of balance or coordination. ? Sudden trouble walking.  E is for eyes. Signs include: ? A sudden change in vision. ? Trouble seeing.  F is for face. Signs include: ? Sudden weakness or numbness of the face. ? The face or eyelid drooping to one side.  A is for arms. Signs include: ? Sudden weakness or numbness of the arm, usually on one side of the body.  S is for speech. Signs include: ? Trouble speaking (aphasia). ? Trouble understanding.  T is for time. ? These symptoms may represent a serious problem that is an emergency. Do not wait to see if the symptoms will go away. Get medical help right away. Call your local emergency services (911 in the [place] Do not drive yourself to the hospital.  Other signs of stroke may include: ? A sudden, severe headache with no known cause. ? Nausea or  vomiting. ? Seizure.  Where to find more information: For more information, visit:  American Stroke Association: www.strokeassociation.org  National Stroke Association: www.stroke.org  Summary  You can prevent a stroke by eating healthy, exercising, not smoking, limiting alcohol intake, and managing any medical conditions you may have.  Do not use any products that contain nicotine or tobacco, such as cigarettes and e-cigarettes. If you need help quitting, ask your health care provider. It may also be helpful to avoid exposure to secondhand smoke.  Remember BEFAST for warning signs of stroke. Get help right away if you or a loved one has any of these signs. This information is not intended to replace advice given to you by your health care provider. Make sure you discuss any questions you have with your health care provider. Document Released: 12/31/2004 Document Revised: 12/29/2016 Document Reviewed: 12/29/2016 Elsevier Interactive Patient Education  [employer].

## 2018-03-04 ENCOUNTER — Ambulatory Visit (INDEPENDENT_AMBULATORY_CARE_PROVIDER_SITE_OTHER): Payer: Medicare Other | Admitting: *Deleted

## 2018-03-04 DIAGNOSIS — Z5181 Encounter for therapeutic drug level monitoring: Secondary | ICD-10-CM

## 2018-03-04 DIAGNOSIS — I359 Nonrheumatic aortic valve disorder, unspecified: Secondary | ICD-10-CM | POA: Diagnosis not present

## 2018-03-04 DIAGNOSIS — G459 Transient cerebral ischemic attack, unspecified: Secondary | ICD-10-CM

## 2018-03-04 LAB — POCT INR: INR: 3.7

## 2018-03-04 NOTE — Patient Instructions (Signed)
Description    Tomorrow only take 5mg , then Continue taking 10mg  daily except 5mg  on Mondays and Fridays. Recheck INR in 4 weeks. Call us with any medication changes or concerns [facility] [phone], Main # [phone].

## 2018-03-09 DIAGNOSIS — I10 Essential (primary) hypertension: Secondary | ICD-10-CM | POA: Diagnosis not present

## 2018-03-09 DIAGNOSIS — I739 Peripheral vascular disease, unspecified: Secondary | ICD-10-CM | POA: Diagnosis not present

## 2018-03-09 DIAGNOSIS — I639 Cerebral infarction, unspecified: Secondary | ICD-10-CM | POA: Diagnosis not present

## 2018-03-09 DIAGNOSIS — Z6827 Body mass index (BMI) 27.0-27.9, adult: Secondary | ICD-10-CM | POA: Diagnosis not present

## 2018-03-09 DIAGNOSIS — E7849 Other hyperlipidemia: Secondary | ICD-10-CM | POA: Diagnosis not present

## 2018-03-09 DIAGNOSIS — N182 Chronic kidney disease, stage 2 (mild): Secondary | ICD-10-CM | POA: Diagnosis not present

## 2018-03-09 DIAGNOSIS — I2510 Atherosclerotic heart disease of native coronary artery without angina pectoris: Secondary | ICD-10-CM | POA: Diagnosis not present

## 2018-03-20 ENCOUNTER — Other Ambulatory Visit: Payer: Self-pay | Admitting: Cardiovascular Disease

## 2018-04-01 ENCOUNTER — Ambulatory Visit (INDEPENDENT_AMBULATORY_CARE_PROVIDER_SITE_OTHER): Payer: Medicare Other | Admitting: *Deleted

## 2018-04-01 DIAGNOSIS — Z5181 Encounter for therapeutic drug level monitoring: Secondary | ICD-10-CM | POA: Diagnosis not present

## 2018-04-01 DIAGNOSIS — I359 Nonrheumatic aortic valve disorder, unspecified: Secondary | ICD-10-CM | POA: Diagnosis not present

## 2018-04-01 DIAGNOSIS — G459 Transient cerebral ischemic attack, unspecified: Secondary | ICD-10-CM | POA: Diagnosis not present

## 2018-04-01 LAB — POCT INR: INR: 2.6

## 2018-04-01 NOTE — Patient Instructions (Signed)
Description   Continue taking 10mg  daily except 5mg  on Mondays and Fridays. Recheck INR in 4 weeks. Call us with any medication changes or concerns [facility] [phone], Main # [phone].

## 2018-04-14 DIAGNOSIS — I2510 Atherosclerotic heart disease of native coronary artery without angina pectoris: Secondary | ICD-10-CM | POA: Diagnosis not present

## 2018-04-14 DIAGNOSIS — I779 Disorder of arteries and arterioles, unspecified: Secondary | ICD-10-CM | POA: Diagnosis not present

## 2018-04-14 DIAGNOSIS — H9203 Otalgia, bilateral: Secondary | ICD-10-CM | POA: Diagnosis not present

## 2018-04-14 DIAGNOSIS — H6123 Impacted cerumen, bilateral: Secondary | ICD-10-CM | POA: Diagnosis not present

## 2018-04-29 ENCOUNTER — Ambulatory Visit (INDEPENDENT_AMBULATORY_CARE_PROVIDER_SITE_OTHER): Payer: Medicare Other | Admitting: Pharmacist

## 2018-04-29 DIAGNOSIS — Z5181 Encounter for therapeutic drug level monitoring: Secondary | ICD-10-CM

## 2018-04-29 DIAGNOSIS — I359 Nonrheumatic aortic valve disorder, unspecified: Secondary | ICD-10-CM | POA: Diagnosis not present

## 2018-04-29 DIAGNOSIS — G459 Transient cerebral ischemic attack, unspecified: Secondary | ICD-10-CM | POA: Diagnosis not present

## 2018-04-29 LAB — POCT INR: INR: 2.8 (ref 2.0–3.0)

## 2018-04-29 NOTE — Patient Instructions (Signed)
Description   Continue taking 10mg  daily except 5mg  on Mondays and Fridays. Recheck INR in 4 weeks. Call us with any medication changes or concerns [facility] [phone], Main # [phone].

## 2018-05-27 ENCOUNTER — Ambulatory Visit (INDEPENDENT_AMBULATORY_CARE_PROVIDER_SITE_OTHER): Payer: Medicare Other | Admitting: *Deleted

## 2018-05-27 DIAGNOSIS — R2990 Unspecified symptoms and signs involving the nervous system: Secondary | ICD-10-CM | POA: Diagnosis not present

## 2018-05-27 DIAGNOSIS — I359 Nonrheumatic aortic valve disorder, unspecified: Secondary | ICD-10-CM | POA: Diagnosis not present

## 2018-05-27 DIAGNOSIS — G459 Transient cerebral ischemic attack, unspecified: Secondary | ICD-10-CM

## 2018-05-27 DIAGNOSIS — Z5181 Encounter for therapeutic drug level monitoring: Secondary | ICD-10-CM

## 2018-05-27 DIAGNOSIS — Z952 Presence of prosthetic heart valve: Secondary | ICD-10-CM

## 2018-05-27 LAB — POCT INR: INR: 2.5 (ref 2.0–3.0)

## 2018-05-27 NOTE — Patient Instructions (Signed)
Description   Continue taking 10mg  daily except 5mg  on Mondays and Fridays. Recheck INR in 6 weeks. Call us with any medication changes or concerns [facility] [phone], Main # [phone].

## 2018-06-14 DIAGNOSIS — H43813 Vitreous degeneration, bilateral: Secondary | ICD-10-CM | POA: Diagnosis not present

## 2018-06-14 DIAGNOSIS — E113292 Type 2 diabetes mellitus with mild nonproliferative diabetic retinopathy without macular edema, left eye: Secondary | ICD-10-CM | POA: Diagnosis not present

## 2018-06-14 DIAGNOSIS — Z961 Presence of intraocular lens: Secondary | ICD-10-CM | POA: Diagnosis not present

## 2018-06-28 ENCOUNTER — Ambulatory Visit (HOSPITAL_COMMUNITY)
Admission: RE | Admit: 2018-06-28 | Discharge: 2018-06-28 | Disposition: A | Discharge status: Home / Self Care | Payer: Medicare Other | Source: Ambulatory Visit | Attending: Internal Medicine | Admitting: Internal Medicine

## 2018-06-28 DIAGNOSIS — I701 Atherosclerosis of renal artery: Secondary | ICD-10-CM | POA: Insufficient documentation

## 2018-06-29 ENCOUNTER — Encounter: Payer: Self-pay | Admitting: *Deleted

## 2018-06-29 NOTE — Telephone Encounter (Addendum)
Left message for pt to call    ----- Message from Lorretta Harp, MD sent at 06/29/2018 12:48 PM EDT ----- Return office visit with me to discuss results at next available

## 2018-07-04 ENCOUNTER — Telehealth: Payer: Self-pay | Admitting: Surgery

## 2018-07-04 NOTE — Telephone Encounter (Signed)
New Message        Patient returned your call

## 2018-07-04 NOTE — Telephone Encounter (Signed)
This encounter was created in error - please disregard.

## 2018-07-04 NOTE — Telephone Encounter (Signed)
Patient made aware of results and verbalized understanding. Appointment made for 8/6 with Dr. Gwenlyn Found.  Notes recorded by Lorretta Harp, MD on 06/29/2018 at 12:48 PM EDT Return office visit with me to discuss results at next available

## 2018-07-08 ENCOUNTER — Ambulatory Visit (INDEPENDENT_AMBULATORY_CARE_PROVIDER_SITE_OTHER): Payer: Medicare Other | Admitting: *Deleted

## 2018-07-08 DIAGNOSIS — I359 Nonrheumatic aortic valve disorder, unspecified: Secondary | ICD-10-CM | POA: Diagnosis not present

## 2018-07-08 DIAGNOSIS — Z5181 Encounter for therapeutic drug level monitoring: Secondary | ICD-10-CM

## 2018-07-08 DIAGNOSIS — G459 Transient cerebral ischemic attack, unspecified: Secondary | ICD-10-CM

## 2018-07-08 LAB — POCT INR: INR: 2.3 (ref 2.0–3.0)

## 2018-07-08 NOTE — Patient Instructions (Addendum)
Description   Today take 10mg  then continue taking 10mg  daily except 5mg  on Mondays and Fridays. Recheck INR in 5 weeks with MD appt. Call us with any medication changes or concerns [facility] [phone], Main # [phone].

## 2018-07-11 ENCOUNTER — Other Ambulatory Visit: Payer: Self-pay | Admitting: Cardiovascular Disease

## 2018-07-11 DIAGNOSIS — Z7901 Long term (current) use of anticoagulants: Secondary | ICD-10-CM | POA: Diagnosis not present

## 2018-07-11 DIAGNOSIS — I701 Atherosclerosis of renal artery: Secondary | ICD-10-CM | POA: Diagnosis not present

## 2018-07-11 DIAGNOSIS — I1310 Hypertensive heart and chronic kidney disease without heart failure, with stage 1 through stage 4 chronic kidney disease, or unspecified chronic kidney disease: Secondary | ICD-10-CM | POA: Diagnosis not present

## 2018-07-11 DIAGNOSIS — I739 Peripheral vascular disease, unspecified: Secondary | ICD-10-CM | POA: Diagnosis not present

## 2018-07-11 DIAGNOSIS — Z6827 Body mass index (BMI) 27.0-27.9, adult: Secondary | ICD-10-CM | POA: Diagnosis not present

## 2018-07-11 DIAGNOSIS — N182 Chronic kidney disease, stage 2 (mild): Secondary | ICD-10-CM | POA: Diagnosis not present

## 2018-07-11 DIAGNOSIS — E1122 Type 2 diabetes mellitus with diabetic chronic kidney disease: Secondary | ICD-10-CM | POA: Diagnosis not present

## 2018-07-11 DIAGNOSIS — E7849 Other hyperlipidemia: Secondary | ICD-10-CM | POA: Diagnosis not present

## 2018-07-11 DIAGNOSIS — J449 Chronic obstructive pulmonary disease, unspecified: Secondary | ICD-10-CM | POA: Diagnosis not present

## 2018-07-11 DIAGNOSIS — Z9862 Peripheral vascular angioplasty status: Secondary | ICD-10-CM

## 2018-07-12 ENCOUNTER — Encounter: Payer: Self-pay | Admitting: Cardiovascular Disease

## 2018-07-12 ENCOUNTER — Other Ambulatory Visit: Payer: Self-pay | Admitting: Cardiovascular Disease

## 2018-07-12 ENCOUNTER — Ambulatory Visit (HOSPITAL_COMMUNITY)
Admission: RE | Admit: 2018-07-12 | Discharge: 2018-07-12 | Disposition: A | Discharge status: Home / Self Care | Payer: Medicare Other | Source: Ambulatory Visit | Attending: Cardiology | Admitting: Cardiology

## 2018-07-12 ENCOUNTER — Ambulatory Visit (INDEPENDENT_AMBULATORY_CARE_PROVIDER_SITE_OTHER): Payer: Medicare Other | Admitting: Cardiovascular Disease

## 2018-07-12 VITALS — BP 110/70 | HR 64 | Ht 68.0 in

## 2018-07-12 DIAGNOSIS — I739 Peripheral vascular disease, unspecified: Secondary | ICD-10-CM | POA: Diagnosis not present

## 2018-07-12 DIAGNOSIS — E7800 Pure hypercholesterolemia, unspecified: Secondary | ICD-10-CM

## 2018-07-12 DIAGNOSIS — Z95820 Peripheral vascular angioplasty status with implants and grafts: Secondary | ICD-10-CM | POA: Diagnosis not present

## 2018-07-12 DIAGNOSIS — I259 Chronic ischemic heart disease, unspecified: Secondary | ICD-10-CM

## 2018-07-12 DIAGNOSIS — I701 Atherosclerosis of renal artery: Secondary | ICD-10-CM | POA: Diagnosis not present

## 2018-07-12 DIAGNOSIS — Z9862 Peripheral vascular angioplasty status: Secondary | ICD-10-CM

## 2018-07-12 NOTE — Patient Instructions (Signed)

## 2018-07-12 NOTE — Assessment & Plan Note (Signed)
History of hyperlipidemia on atorvastatin recently begun on Repatha by his PCP.

## 2018-07-12 NOTE — Assessment & Plan Note (Signed)
History of peripheral arterial disease status post aortobifemoral bypass grafting with left external iliac artery stenting and femoral endarterectomy by Dr. Donnetta Hutching.  I performed ostial and mid right SFA intervention 10/01/2016.  His Dopplers performed a year ago showed a widely patent stent and mid left right SFA and they were performed again today.  He denies claudication and exercises on a daily basis.

## 2018-07-12 NOTE — Progress Notes (Signed)
07/12/2018 [name] [name]   October 06, 1946  [id number]  Primary Physician Shon Baton, MD Primary Cardiologist: Lorretta Harp MD [name], [place]  HPI:  [name] [name] is a 72 y.o.  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 I last saw him in the office  12/24/2017. He retired a little over 2 years ago having worked there for 25 years. His primary care physician is Dr. Virgina Jock. He was referred by Dr. Johnsie Cancel for peripheral vascular evaluation because of resistant hypertension with duplex evidence of bilateral renal artery stenosis as well as new-onset right calf claudication with recent Dopplers that showed a lesion in his mid right SFA. He does have a history of remote tobacco abuse as well as treated hypertension, hyperlipidemia and diabetes. He has had coronary bypass grafting and [facility] Jude aVR performed by Dr. Cyndia Bent December 2004 on Coumadin anticoagulation. He has also had aortobifemoral bypass grafting, left external iliac stenting and left common femoral endarterectomy performed by Dr. Donnetta Hutching. I performed left renal artery stenting 02/21/15 with an excellent angiographic and clinical result. His blood pressure has been under better control. I then perform staged right SFA directional atherectomy 04/22/15 removing a high-grade eccentric calcified mid to distal right SFA plaque. He did have progression of disease at the origin of his right SFA. His claudication markedly improved after his procedure however since I saw on the year ago he's had some recurrent claudication. His blood pressures have been under better control since his renal artery intervention. Recent lower extremity Doppler studies performed 07/15/16 revealed a right ABI 0.34 with a new high-frequency signal in the origin of his right SFA. I performed angiography 08/24/16 revealing a 95%  proximal right SFA stenosis, 80% mid right SFA stenosis and 80% calcified mid left SFA stenosis. In addition, he had 75% "in-stent restenosis" within the previously placed left renal artery stent. Intervention will be somewhat challenging given the proximity of the proximal right SFA stenosis . He underwent peripheral angiography intervention by myself 10/01/16. Right antegrade approach. I was able to stick the hood of his aortobifem and intrarenal in the proximal right SFA with drug-eluting [place] plasty and stents. I also performed drug-eluting plasty in the mid right SFA. His symptoms resolved after that and he is back playing golf. He was discharged on the following day. His Dopplers have normalized. Since I saw him in the office a year ago he's remained stable. He denies claudication. His most recent Dopplers performed in July of last year revealed widely patent proximal right SFA stent and mid right SFA. Since I saw him several months ago he was recently admitted with a stroke. His INR was subtherapeutic for unclear reasons. He was re-anticoagulated. An MRI showed a small left frontal CVA. He did have some facial drooping and numbness which is slowly improving. He still denies claudication.  Since I saw him 6 months ago he is remained stable.  He does exercise at the gym every morning during the week and is not playing golf and denies claudication.  He was recently begun on Repatha by his PCP.  He had renal and lower external he Dopplers today.  His lower external he Dopplers performed a year ago revealed widely patent proximal right SFA stent and mid right SFA.  His renal Doppler suggested a patent left renal artery stent with progression in his  right renal artery although his blood pressures have been under good control.   Current Meds  Medication Sig  . amLODipine (NORVASC) 10 MG tablet Take 10 mg by mouth daily.  [name] aspirin EC 81 MG tablet Take 81 mg by mouth daily.  [name] atorvastatin (LIPITOR)  80 MG tablet Take 80 mg by mouth daily.   . canagliflozin (INVOKANA) 100 MG TABS tablet Take 100 mg by mouth daily.  . Evolocumab (REPATHA SURECLICK) 893 MG/ML SOAJ Inject 140 mg into the skin every 14 (fourteen) days.  [name] glyBURIDE (DIABETA) 2.5 MG tablet Take 5 mg by mouth 2 (two) times daily with a meal.   . hydrochlorothiazide (MICROZIDE) 12.5 MG capsule TAKE ONE CAPSULE BY MOUTH EVERY DAY  . metoprolol succinate (TOPROL-XL) 50 MG 24 hr tablet TAKE 1 TABLET (50 MG TOTAL) BY MOUTH DAILY. TAKE WITH OR IMMEDIATELY FOLLOWING A MEAL.  [name] omega-3 acid ethyl esters (LOVAZA) 1 g capsule Take 1 capsule (1 g total) by mouth 2 (two) times daily.  . sildenafil (REVATIO) 20 MG tablet Take 100 mg by mouth daily as needed (erectile dysfunction).   . trandolapril (MAVIK) 4 MG tablet TAKE 1 TABLET BY MOUTH EVERY DAY  . warfarin (COUMADIN) 10 MG tablet Take as directed by Coumadin Clinic     No Known Allergies  Social History   Socioeconomic History  . Marital status: Divorced    Spouse name: Not on file  . Number of children: 3  . Years of education: Not on file  . Highest education level: Not on file  Occupational History  . Occupation: Retired  Scientific laboratory technician  . Financial resource strain: Not on file  . Food insecurity:    Worry: Not on file    Inability: Not on file  . Transportation needs:    Medical: Not on file    Non-medical: Not on file  Tobacco Use  . Smoking status: Former Smoker    Years: 30.00    Types: Cigarettes, Cigars    Last attempt to quit: 12/15/1993    Years since quitting: 24.5  . Smokeless tobacco: Never Used  Substance and Sexual Activity  . Alcohol use: Yes    Alcohol/week: 9.6 oz    Types: 1 Cans of beer, 1 Shots of liquor, 14 Standard drinks or equivalent per week    Comment: drinks 2 martini's a night (2 shots in each)  . Drug use: No  . Sexual activity: Yes  Lifestyle  . Physical activity:    Days per week: Not on file    Minutes per session: Not on file  .  Stress: Not on file  Relationships  . Social connections:    Talks on phone: Not on file    Gets together: Not on file    Attends religious service: Not on file    Active member of club or organization: Not on file    Attends meetings of clubs or organizations: Not on file    Relationship status: Not on file  . Intimate partner violence:    Fear of current or ex partner: Not on file    Emotionally abused: Not on file    Physically abused: Not on file    Forced sexual activity: Not on file  Other Topics Concern  . Not on file  Social History Narrative   Tries to remain active.  Frequent golfer but claudication limits this.     Review of Systems: General: negative for chills, fever, night sweats or weight  changes.  Cardiovascular: negative for chest pain, dyspnea on exertion, edema, orthopnea, palpitations, paroxysmal nocturnal dyspnea or shortness of breath Dermatological: negative for rash Respiratory: negative for cough or wheezing Urologic: negative for hematuria Abdominal: negative for nausea, vomiting, diarrhea, bright red blood per rectum, melena, or hematemesis Neurologic: negative for visual changes, syncope, or dizziness All other systems reviewed and are otherwise negative except as noted above.    Blood pressure 110/70, pulse 64, height 5\' 8"  (1.727 m).  General appearance: alert and no distress Neck: no adenopathy, no carotid bruit, no JVD, supple, symmetrical, trachea midline and thyroid not enlarged, symmetric, no tenderness/mass/nodules Lungs: clear to auscultation bilaterally Heart: Crisp prosthetic heart sounds. Extremities: extremities normal, atraumatic, no cyanosis or edema Pulses: 2+ and symmetric Skin: Skin color, texture, turgor normal. No rashes or lesions Neurologic: Alert and oriented X 3, normal strength and tone. Normal symmetric reflexes. Normal coordination and gait  EKG not performed today  ASSESSMENT AND PLAN:    HYPERCHOLESTEROLEMIA History of hyperlipidemia on atorvastatin recently begun on Repatha by his PCP.  PVD (peripheral vascular disease) ([place]) History of peripheral arterial disease status post aortobifemoral bypass grafting with left external iliac artery stenting and femoral endarterectomy by Dr. Donnetta Hutching.  I performed ostial and mid right SFA intervention 10/01/2016.  His Dopplers performed a year ago showed a widely patent stent and mid left right SFA and they were performed again today.  He denies claudication and exercises on a daily basis.  Renal artery arteriosclerosis History of renal artery stenosis status post left renal artery stenting by myself 02/21/2015 at which time I demonstrated a 50% right renal artery stenosis.  When I restudied him at the time of lower extremity intervention 08/24/2016 he had 75% "in-stent restenosis" within the left renal artery stent 50% right renal artery stenosis.  His blood pressures have been under good control.  Recent renal Dopplers suggest a patent left renal artery stent with progression of disease in his right renal artery with a renal aortic ratio 5.      Lorretta Harp MD FACP,FACC,FAHA, [facility] 07/12/2018 11:29 AM

## 2018-07-12 NOTE — Assessment & Plan Note (Signed)
History of renal artery stenosis status post left renal artery stenting by myself 02/21/2015 at which time I demonstrated a 50% right renal artery stenosis.  When I restudied him at the time of lower extremity intervention 08/24/2016 he had 75% "in-stent restenosis" within the left renal artery stent 50% right renal artery stenosis.  His blood pressures have been under good control.  Recent renal Dopplers suggest a patent left renal artery stent with progression of disease in his right renal artery with a renal aortic ratio 5.

## 2018-07-14 ENCOUNTER — Other Ambulatory Visit: Payer: Self-pay | Admitting: Cardiovascular Disease

## 2018-07-14 ENCOUNTER — Other Ambulatory Visit: Payer: Self-pay | Admitting: *Deleted

## 2018-07-14 DIAGNOSIS — I739 Peripheral vascular disease, unspecified: Secondary | ICD-10-CM

## 2018-07-14 NOTE — Telephone Encounter (Signed)
Rx sent to pharmacy   

## 2018-08-02 NOTE — Progress Notes (Signed)
CARDIOLOGY OFFICE NOTE  Date:  08/10/2018    [name] [name] Date of Birth: 10-11-1946 Medical Record #[id number]  PCP:  Shon Baton, MD  Cardiologist:  Wyn Forster chief complaint on file.   History of Present Illness:  72 y.o. vasculopath. I have not seen him since March 2017 Seen extensively by Dr Gwenlyn Found for his PVD. He has had aortobifemoral bypass in 2016 with f/u left illiac stenting and femoral endarterectomy Subsequent ostial and mid right SFA intervention 09/2016 Doing well with no claudication. Also history of left renal artery stenting with repeat intervention on left and 75% RRA stenosis on most recent duplex 07/12/18  I believe this is being observed since his renal function and BP are stable.   CAD/CABG with AVR in 2004. Had TIA with some facial droop January 2019 with subRx INR Requires lovenox bridging No recent chest pain or angina Carotids with no significant stenosis on duplex 12/14/17  Started on Repatha this year and labs followed by [name] primary   He is retired from Psychologist, forensic of over 2 years. Not playing as much gold Divorced with two adult children  Last echo 12/14/17 showed EF 60-65% normal AVR no peri valvular regurgitation mean gradient 12 mmHg Peak 25 mmHG   Golfing a lot Most of his family is in [place] Never got remarried     Past Medical History:  Diagnosis Date  . Adenomatous colon polyp 09/1997  . Anemia   . Aortic stenosis    s/p st. jude mechanical AVR - Chronic Coumadin  . Blood transfusion    "related to ITP"  . Coronary artery disease    s/p cabg x 3 11/2003: lima-lad, seq vg to rpda and rpl  . Diverticulitis of colon   . Heart murmur   . Hyperlipidemia   . Hypertension   . ITP (idiopathic thrombocytopenic purpura)   . Peripheral arterial disease ([place])    a. history of aortobifemoral bypass grafting by Dr. Sherren Mocha early b. LE angiography 04/22/2015 patent aortobifem graft, DES to R SFA  . Peripheral vascular  disease ([place])    s/p Left external Iliac Artery stenting and subsequent left femoral endarterectomy 02/2011- post op course complicated by wound infxn req I&D 03/2011  . Renal artery stenosis, native, bilateral ([facility])    a. bilateral renal artery stenosis by recent duplex ultrasound b. L renal artery stent 02/2015, R renal artery patent on angiogram  . Stroke ([place])   . TIA (transient ischemic attack) ~ 2013  . Type II diabetes mellitus ([place])     Past Surgical History:  Procedure Laterality Date  . ABDOMINAL AORTAGRAM N/A 12/16/2011   Procedure: ABDOMINAL Maxcine Ham;  Surgeon: Sherren Mocha, MD;  Location: [facility] CATH LAB;  Service: Cardiovascular;  Laterality: N/A;  . ANGIOPLASTY / STENTING ILIAC     Left external Iliac Artery  . AORTA - BILATERAL FEMORAL ARTERY BYPASS GRAFT  01/18/2012   Procedure: AORTA BIFEMORAL BYPASS GRAFT;  Surgeon: Curt Jews, MD;  Location: [place];  Service: Vascular;  Laterality: N/A;  . AORTIC VALVE REPLACEMENT  ~ 2004  . CARDIAC CATHETERIZATION  11/2003   /pt report 10/01/2016  . CARDIAC VALVE REPLACEMENT  11/2003   aortic  . CATARACT EXTRACTION W/ INTRAOCULAR LENS  IMPLANT, BILATERAL Bilateral   . CORONARY ARTERY BYPASS GRAFT  11/2003   Archie Endo 04/21/2011  . LOWER EXTREMITY ANGIOGRAM N/A 02/21/2015   Procedure: LOWER EXTREMITY ANGIOGRAM;  Surgeon: Lorretta Harp, MD;  Location: [facility]  CATH LAB;  Service: Cardiovascular;  Laterality: N/A;  . PERIPHERAL VASCULAR CATHETERIZATION N/A 04/22/2015   Procedure: Lower Extremity Angiography;  Surgeon: Lorretta Harp, MD;  Location: [place] CV LAB;  Service: Cardiovascular;  Laterality: N/A;  . PERIPHERAL VASCULAR CATHETERIZATION N/A 08/24/2016   Procedure: Lower Extremity Angiography;  Surgeon: Lorretta Harp, MD;  Location: [place] CV LAB;  Service: Cardiovascular;  Laterality: N/A;  . PERIPHERAL VASCULAR CATHETERIZATION Right 10/01/2016   Procedure: Peripheral Vascular Intervention - STENT;  Surgeon: Lorretta Harp,  MD;  Location: [place] CV LAB;  Service: Cardiovascular;  Laterality: Right;  Prox and MID SFA   . RENAL ANGIOGRAM N/A 02/21/2015   Procedure: RENAL ANGIOGRAM;  Surgeon: Lorretta Harp, MD;  Location: [place] CATH LAB;  Service: Cardiovascular;  Laterality: Bilateral; 6 mm x 12 mm long Herculink balloon expandable stent to the left renal artery  . RENAL ARTERY STENT Left 04/22/2015   dr berry  . SPLENECTOMY  02/2003   Archie Endo 04/21/2011  . TONSILLECTOMY  ~ 1952     Medications: Current Meds  Medication Sig  . amLODipine (NORVASC) 10 MG tablet Take 10 mg by mouth daily.  [name] aspirin EC 81 MG tablet Take 81 mg by mouth daily.  [name] atorvastatin (LIPITOR) 80 MG tablet Take 80 mg by mouth daily.   . canagliflozin (INVOKANA) 100 MG TABS tablet Take 100 mg by mouth daily.  . Evolocumab (REPATHA SURECLICK) 454 MG/ML SOAJ Inject 140 mg into the skin every 14 (fourteen) days.  [name] glyBURIDE (DIABETA) 2.5 MG tablet Take 5 mg by mouth 2 (two) times daily with a meal.   . hydrochlorothiazide (MICROZIDE) 12.5 MG capsule TAKE ONE CAPSULE BY MOUTH EVERY DAY  . metoprolol succinate (TOPROL-XL) 50 MG 24 hr tablet TAKE 1 TABLET (50 MG TOTAL) BY MOUTH DAILY. TAKE WITH OR IMMEDIATELY FOLLOWING A MEAL.  [name] omega-3 acid ethyl esters (LOVAZA) 1 g capsule Take 1 capsule (1 g total) by mouth 2 (two) times daily.  . sildenafil (REVATIO) 20 MG tablet Take 100 mg by mouth daily as needed (erectile dysfunction).   . trandolapril (MAVIK) 4 MG tablet TAKE 1 TABLET BY MOUTH EVERY DAY  . warfarin (COUMADIN) 10 MG tablet TAKE AS DIRECTED BY COUMADIN CLINIC     Allergies: No Known Allergies  Social History: The patient  reports that he quit smoking about 24 years ago. His smoking use included cigarettes and cigars. He quit after 30.00 years of use. He has never used smokeless tobacco. He reports that he drinks about 16.0 standard drinks of alcohol per week. He reports that he does not use drugs.   Family History: The  patient's family history includes Breast cancer in his sister; Coronary artery disease in his mother; Diabetes in his paternal aunt, paternal grandmother, and unknown relative; Heart disease in his father.   Review of Systems: Please see the history of present illness.   Otherwise, the review of systems is positive for none.   All other systems are reviewed and negative.   Physical Exam: VS:  BP 122/78   Pulse 70   Ht 5\' 8"  (1.727 m)   Wt 177 lb 4 oz (80.4 kg)   SpO2 93%   BMI 26.95 kg/m  .  BMI Body mass index is 26.95 kg/m.  Wt Readings from Last 3 Encounters:  08/10/18 177 lb 4 oz (80.4 kg)  02/17/18 174 lb (78.9 kg)  01/11/18 173 lb 1.9 oz (78.5 kg)   Affect appropriate  Healthy:  appears stated age 89: normal Neck supple with no adenopathy JVP normal no bruits no thyromegaly Lungs clear with no wheezing and good diaphragmatic motion Heart:  Y5/W3 click SEM  No AR  murmur, no rub, gallop or click PMI normal Abdomen: benighn, BS positve, no tenderness, post AortoBifem Femoral bruits   No HSM or HJR Post aorto bifem dicreased pedal pulses  No edema Neuro non-focal Skin warm and dry No muscular weakness    LABORATORY DATA:  EKG:   SR RAD rate 71 otherwise normal   Lab Results  Component Value Date   WBC 6.6 12/21/2017   HGB 15.7 12/21/2017   HCT 47.1 12/21/2017   PLT 191 12/21/2017   GLUCOSE 123 (H) 12/21/2017   CHOL 178 12/21/2017   TRIG 121 12/21/2017   HDL 54 12/21/2017   LDLCALC 100 (H) 12/21/2017   ALT 61 12/21/2017   AST 38 12/21/2017   NA 137 12/21/2017   K 3.7 12/21/2017   CL 101 12/21/2017   CREATININE 0.87 12/21/2017   BUN 15 12/21/2017   CO2 25 12/21/2017   TSH 2.178 12/13/2017   INR 2.3 07/08/2018   HGBA1C 6.7 (H) 12/21/2017   Lab Results  Component Value Date   INR 2.3 07/08/2018   INR 2.5 05/27/2018   INR 2.8 04/29/2018   PROTIME 16.1 05/22/2009   PROTIME 23.6 05/08/2009     BNP (last 3 results) No results for input(s): BNP  in the last 8760 hours.  ProBNP (last 3 results) No results for input(s): PROBNP in the last 8760 hours.   Other Studies Reviewed Today:  Echo Study Conclusions 12/2017  - Left ventricle: The cavity size was normal. Wall thickness was   increased in a pattern of moderate LVH. Systolic function was   normal. The estimated ejection fraction was in the range of 60%   to 65%. Left ventricular diastolic function parameters were   normal. - Aortic valve: Normal appearing mechanical AVR with no peri   valvular regurgitation Valve area (VTI): 1.45 cm^2. Valve area   (Vmax): 1.11 cm^2. Valve area (Vmean): 1.32 cm^2. - Mitral valve: Calcified annulus. Mildly thickened leaflets .   Valve area by pressure half-time: 1.22 cm^2. - Atrial septum: No defect or patent foramen ovale was identified.  Assessment/Plan:  1. Stroke from HTN/subtherapeutic INR - very little residual - doing well.. F/U Sethi requires lovenox bridging   2. HTN - Well controlled.  Continue current medications and low sodium Dash type diet.  Some white coat component    3. CAD/AS with remote CABG/AVR - Normal AVR function by echo January 2019 SBE prophylaxis   4. Chronic anticoagulation - f/u coumadin clinic   5. PAD - followed by Dr. Gwenlyn Found and Early.  Claudication improved HTN controlled f/u duplex for surveillance of renal arteries   6. HLD - on Repatha followed by [name]  Current medicines are reviewed with the patient today.  The patient does not have concerns regarding medicines other than what has been noted above.  The following changes have been made:  See above.  Labs/ tests ordered today include:   No orders of the defined types were placed in this encounter.    Disposition:   FU with Dr Gwenlyn Found in 6 months and me in a year    Jenkins Rouge MD [facility]

## 2018-08-10 ENCOUNTER — Ambulatory Visit (INDEPENDENT_AMBULATORY_CARE_PROVIDER_SITE_OTHER): Payer: Medicare Other | Admitting: Cardiovascular Disease

## 2018-08-10 ENCOUNTER — Encounter: Payer: Self-pay | Admitting: Cardiovascular Disease

## 2018-08-10 ENCOUNTER — Ambulatory Visit (INDEPENDENT_AMBULATORY_CARE_PROVIDER_SITE_OTHER): Payer: Medicare Other | Admitting: Pharmacist

## 2018-08-10 VITALS — BP 122/78 | HR 70 | Ht 68.0 in | Wt 177.2 lb

## 2018-08-10 DIAGNOSIS — G459 Transient cerebral ischemic attack, unspecified: Secondary | ICD-10-CM

## 2018-08-10 DIAGNOSIS — I359 Nonrheumatic aortic valve disorder, unspecified: Secondary | ICD-10-CM

## 2018-08-10 DIAGNOSIS — I259 Chronic ischemic heart disease, unspecified: Secondary | ICD-10-CM | POA: Diagnosis not present

## 2018-08-10 DIAGNOSIS — E782 Mixed hyperlipidemia: Secondary | ICD-10-CM | POA: Diagnosis not present

## 2018-08-10 DIAGNOSIS — I739 Peripheral vascular disease, unspecified: Secondary | ICD-10-CM

## 2018-08-10 DIAGNOSIS — Z5181 Encounter for therapeutic drug level monitoring: Secondary | ICD-10-CM | POA: Diagnosis not present

## 2018-08-10 DIAGNOSIS — I10 Essential (primary) hypertension: Secondary | ICD-10-CM | POA: Diagnosis not present

## 2018-08-10 DIAGNOSIS — I25810 Atherosclerosis of coronary artery bypass graft(s) without angina pectoris: Secondary | ICD-10-CM

## 2018-08-10 LAB — POCT INR: INR: 3.1 — AB (ref 2.0–3.0)

## 2018-08-10 NOTE — Patient Instructions (Addendum)
Medication Instructions:  Your physician recommends that you continue on your current medications as directed. Please refer to the Current Medication list given to you today.  Labwork: NONE  Testing/Procedures: NONE  Follow-Up: Your physician wants you to follow-up in: 6 months with Dr. Andria Rhein will receive a reminder letter in the mail two months in advance. If you don't receive a letter, please call our office to schedule the follow-up appointment.  Your physician wants you to follow-up in: 12 months with Dr. Johnsie Cancel. You will receive a reminder letter in the mail two months in advance. If you don't receive a letter, please call our office to schedule the follow-up appointment.   If you need a refill on your cardiac medications before your next appointment, please call your pharmacy.

## 2018-08-10 NOTE — Patient Instructions (Signed)
Description   Continue taking 10mg  daily except 5mg  on Mondays and Fridays. Recheck INR in 6 weeks with MD appt. Call us with any medication changes or concerns Coumadin Clinic [phone], Main # [phone].

## 2018-08-22 ENCOUNTER — Ambulatory Visit (INDEPENDENT_AMBULATORY_CARE_PROVIDER_SITE_OTHER): Payer: Medicare Other | Admitting: Adult Health

## 2018-08-22 ENCOUNTER — Encounter: Payer: Self-pay | Admitting: Adult Health

## 2018-08-22 VITALS — BP 145/54 | HR 67 | Ht 68.0 in | Wt 175.6 lb

## 2018-08-22 DIAGNOSIS — E785 Hyperlipidemia, unspecified: Secondary | ICD-10-CM

## 2018-08-22 DIAGNOSIS — I63412 Cerebral infarction due to embolism of left middle cerebral artery: Secondary | ICD-10-CM

## 2018-08-22 DIAGNOSIS — I10 Essential (primary) hypertension: Secondary | ICD-10-CM | POA: Diagnosis not present

## 2018-08-22 NOTE — Patient Instructions (Signed)
Continue aspirin 81 mg daily and warfarin daily  and lipitor 80mg  and Repatha  for secondary stroke prevention  Continue to follow up with PCP regarding cholesterol and blood pressure management   Continue to follow with cardiology as scheduled and coumadin clinic  Stay active and maintain healthy diet  Continue to monitor blood pressure at home  Maintain strict control of hypertension with blood pressure goal below 130/90, diabetes with hemoglobin A1c goal below 6.5% and cholesterol with LDL cholesterol (bad cholesterol) goal below 70 mg/dL. I also advised the patient to eat a healthy diet with plenty of whole grains, cereals, fruits and vegetables, exercise regularly and maintain ideal body weight.  Followup in the future with me in 6 months or call earlier if needed       Thank you for coming to see [place] at [facility]. I hope we have been able to provide you high quality care today.  You may receive a patient satisfaction survey over the next few weeks. We would appreciate your feedback and comments so that we may continue to improve ourselves and the health of our patients.

## 2018-08-22 NOTE — Progress Notes (Signed)
[facility] [address] [place]. [place] [address] [phone]       OFFICE FOLLOW-UP NOTE  [name]. [name] [name] Date of Birth:  [birth year] Medical Record Number:  [id number]   Chief Complaint  Patient presents with  . Follow-up    6 month follow up. Patient is alone. No new changes or concerns at this time.      HPI: [name] [name] is a pleasant 72 year old Caucasian male seen today for first office follow-up visit following hospital admission for strokein January 2019. History is obtained from the patient, review of electronic medical records and have personally reviewed imaging films.[name] [name] is an 72 y.o. male with history of type 2 diabetes, stroke, PVD, hypertension, hyperlipidemia, ITP, claudication, aortic stenosis, mechanical aortic valve replacement on Coumadin recently discharged from the hospital on 12/18/17 for a left fronto-parietal infarct due to subtherapeutic anticoagulation with INR of 1.29. He was bridged with heparin and Warfarin dose was increased with INR of 2.5 at time of discharge. Deficits from the stroke includes right facial droop, right side weakness and mild dysarthria. MRI brain showed 1 cm acute infarction affecting left frontoparietal gyrus. LDL 92, A1c 6.8. Carotid Dopplers: No significant bilateral carotid stenosis. 2-D echo: LVEF 60-65 percent. No report of clots on AV Patient presented again on 12/20/17 after having a severe headache this afternoon after coming from from the gym. He also felt extremely disoriented which gradually improved. His blood pressure on arrival was 192/53 with repeat 182/88 mmHg. his headache is now gradually improved and patient no longer feels confused. The patient's repeat INRon 12/20/17 was 1.9. Repeat MRI scan did not show an acute stroke. Patient blood pressure was high. He is admitted with tight blood pressure control and did well.   02/17/2018 visit  PS: he states his done well since discharge.  His last INR checked 2 weeks ago was 3.2. His tolerating warfarin without bleeding or bruising and has been on it for 10 years now. He is tolerating Lipitor 80 mg well without muscle aches and pains. States her blood pressure is well controlled at home though today it is slightly elevated 146/57. He states his speech has recovered almost back to baseline. Only occasionally he may notice some right facial sagging as well as some word finding difficulties but most of his friends cannot even tell that his had a stroke.  Interval history 08/22/2018: Patient is being seen today for scheduled follow-up visit.  He states he has been doing well overall without residual deficit or recurrence of symptoms.  He continues to take aspirin 81 mg and warfarin without side effects of bleeding or bruising.  He is followed in Coumadin clinic for INR monitoring and recent INR 3.1.  Continues to take Lipitor without side effects myalgias along with starting Repatha in 03/2018.  States PCP recently checked lipid panel and per patient, was satisfactory.  Blood pressure today elevated for patient at 145/54.  He states typically 110-120/70.  He continues to stay active where he plays golf 3 times weekly and works out at [employer] twice weekly.  Denies new or worsening stroke/TIA symptoms.      ROS:   14 system review of systems is positive for insomnia and frequent waking and all other systems negative  PMH:  Past Medical History:  Diagnosis Date  . Adenomatous colon polyp 09/1997  . Anemia   . Aortic stenosis    s/p [facility] mechanical AVR - Chronic Coumadin  . Blood transfusion    "  related to ITP"  . Coronary artery disease    s/p cabg x 3 11/2003: lima-lad, seq vg to rpda and rpl  . Diverticulitis of colon   . Heart murmur   . Hyperlipidemia   . Hypertension   . ITP (idiopathic thrombocytopenic purpura)   . Peripheral arterial disease ([place])    a. history of aortobifemoral bypass grafting by Dr. Sherren Mocha early b. LE  angiography 04/22/2015 patent aortobifem graft, DES to R SFA  . Peripheral vascular disease ([place])    s/p Left external Iliac Artery stenting and subsequent left femoral endarterectomy 02/2011- post op course complicated by wound infxn req I&D 03/2011  . Renal artery stenosis, native, bilateral ([facility])    a. bilateral renal artery stenosis by recent duplex ultrasound b. L renal artery stent 02/2015, R renal artery patent on angiogram  . Stroke ([place])   . TIA (transient ischemic attack) ~ 2013  . Type II diabetes mellitus ([place])     Social History:  Social History   Socioeconomic History  . Marital status: Divorced    Spouse name: Not on file  . Number of children: 3  . Years of education: Not on file  . Highest education level: Not on file  Occupational History  . Occupation: Retired  Scientific laboratory technician  . Financial resource strain: Not on file  . Food insecurity:    Worry: Not on file    Inability: Not on file  . Transportation needs:    Medical: Not on file    Non-medical: Not on file  Tobacco Use  . Smoking status: Former Smoker    Years: 30.00    Types: Cigarettes, Cigars    Last attempt to quit: 12/15/1993    Years since quitting: 24.7  . Smokeless tobacco: Never Used  Substance and Sexual Activity  . Alcohol use: Yes    Alcohol/week: 16.0 standard drinks    Types: 1 Cans of beer, 1 Shots of liquor, 14 Standard drinks or equivalent per week    Comment: drinks 2 martini's a night (2 shots in each)  . Drug use: No  . Sexual activity: Yes  Lifestyle  . Physical activity:    Days per week: Not on file    Minutes per session: Not on file  . Stress: Not on file  Relationships  . Social connections:    Talks on phone: Not on file    Gets together: Not on file    Attends religious service: Not on file    Active member of club or organization: Not on file    Attends meetings of clubs or organizations: Not on file    Relationship status: Not on file  . Intimate partner violence:     Fear of current or ex partner: Not on file    Emotionally abused: Not on file    Physically abused: Not on file    Forced sexual activity: Not on file  Other Topics Concern  . Not on file  Social History Narrative   Tries to remain active.  Frequent golfer but claudication limits this.    Medications:   Current Outpatient Medications on File Prior to Visit  Medication Sig Dispense Refill  . amLODipine (NORVASC) 10 MG tablet Take 10 mg by mouth daily.  5  . aspirin EC 81 MG tablet Take 81 mg by mouth daily.    [name] atorvastatin (LIPITOR) 80 MG tablet Take 80 mg by mouth daily.     . canagliflozin (INVOKANA) 100 MG TABS  tablet Take 100 mg by mouth daily.    . Evolocumab (REPATHA SURECLICK) 272 MG/ML SOAJ Inject 140 mg into the skin every 14 (fourteen) days.    [name] glyBURIDE (DIABETA) 2.5 MG tablet Take 5 mg by mouth 2 (two) times daily with a meal.     . hydrochlorothiazide (MICROZIDE) 12.5 MG capsule TAKE ONE CAPSULE BY MOUTH EVERY DAY 90 capsule 3  . metoprolol succinate (TOPROL-XL) 50 MG 24 hr tablet TAKE 1 TABLET (50 MG TOTAL) BY MOUTH DAILY. TAKE WITH OR IMMEDIATELY FOLLOWING A MEAL. 30 tablet 5  . omega-3 acid ethyl esters (LOVAZA) 1 g capsule Take 1 capsule (1 g total) by mouth 2 (two) times daily. 60 capsule 0  . sildenafil (REVATIO) 20 MG tablet Take 100 mg by mouth daily as needed (erectile dysfunction).     . trandolapril (MAVIK) 4 MG tablet TAKE 1 TABLET BY MOUTH EVERY DAY 30 tablet 6  . warfarin (COUMADIN) 10 MG tablet TAKE AS DIRECTED BY COUMADIN CLINIC 35 tablet 3   No current facility-administered medications on file prior to visit.     Allergies:  No Known Allergies  Physical Exam General: well developed, well nourished, pleasant elderly Caucasian male, seated, in no evident distress Head: head normocephalic and atraumatic.  Neck: supple with no carotid or supraclavicular bruits Cardiovascular: regular rate and rhythm, no murmurs but mechanical click  present Musculoskeletal: no deformity Skin:  no rash/petichiae Vascular:  Normal pulses all extremities Vitals:   08/22/18 1547  BP: (!) 145/54  Pulse: 67   Neurologic Exam Mental Status: Awake and fully alert. Oriented to place and time. Recent and remote memory intact. Attention span, concentration and fund of knowledge appropriate. Mood and affect appropriate.  Cranial Nerves: Fundoscopic exam reveals sharp disc margins. Pupils equal, briskly reactive to light. Extraocular movements full without nystagmus. Visual fields full to confrontation. Hearing intact. Facial sensation intact. Face, tongue, palate moves normally and symmetrically.  Motor: Normal bulk and tone. Normal strength in all tested extremity muscles. Sensory.: intact to touch ,pinprick .position and vibratory sensation.  Coordination: Rapid alternating movements normal in all extremities. Finger-to-nose and heel-to-shin performed accurately bilaterally. Gait and Station: Arises from chair without difficulty. Stance is normal. Gait demonstrates normal stride length and balance . Able to heel, toe and tandem walk with some   difficulty.  Reflexes: 1+ and symmetric. Toes downgoing.     ASSESSMENT: 72 year old Caucasian male with embolic left MCA branch infarct in January 2019 secondary to mechanical heart valve with suboptimal anticoagulation.Vascular risk factors of hyperlipidemia, DM, hypertension, mechanical heart valve.  Patient is being seen today for stroke follow-up and overall is doing well without residual deficits or recurring of symptoms.    PLAN: -Continue aspirin 81 mg daily and warfarin daily with INR goal 2.5-3.5 and Lipitor 80 mg and Repatha for secondary stroke prevention -F/u with PCP regarding your HLD, HTN and DM management -f/u with cardiology and Coumadin clinic as scheduled -Advised to continue to stay active and maintain a healthy diet -continue to monitor BP at home -Maintain strict control of  hypertension with blood pressure goal below 130/90, diabetes with hemoglobin A1c goal below 6.5% and cholesterol with LDL cholesterol (bad cholesterol) goal below 70 mg/dL. I also advised the patient to eat a healthy diet with plenty of whole grains, cereals, fruits and vegetables, exercise regularly and maintain ideal body weight.  Follow up in 6 months or call earlier if needed  Greater than 50% of time during this 25 minute  visit was spent on counseling,explanation of diagnosis, planning of further management, discussion with patient and family and coordination of care  Venancio Poisson, [facility]  [facility] [address] [place], [place] [address]  Phone [phone] Fax [phone] Note: This document was prepared with digital dictation and possible smart phrase technology. Any transcriptional errors that result from this process are unintentional.

## 2018-08-23 NOTE — Progress Notes (Signed)
I agree with the above plan 

## 2018-08-29 DIAGNOSIS — L570 Actinic keratosis: Secondary | ICD-10-CM | POA: Diagnosis not present

## 2018-08-29 DIAGNOSIS — L821 Other seborrheic keratosis: Secondary | ICD-10-CM | POA: Diagnosis not present

## 2018-08-29 DIAGNOSIS — D225 Melanocytic nevi of trunk: Secondary | ICD-10-CM | POA: Diagnosis not present

## 2018-09-06 DIAGNOSIS — Z23 Encounter for immunization: Secondary | ICD-10-CM | POA: Diagnosis not present

## 2018-09-21 ENCOUNTER — Ambulatory Visit (INDEPENDENT_AMBULATORY_CARE_PROVIDER_SITE_OTHER): Payer: Medicare Other | Admitting: *Deleted

## 2018-09-21 DIAGNOSIS — Z5181 Encounter for therapeutic drug level monitoring: Secondary | ICD-10-CM

## 2018-09-21 DIAGNOSIS — I359 Nonrheumatic aortic valve disorder, unspecified: Secondary | ICD-10-CM

## 2018-09-21 DIAGNOSIS — G459 Transient cerebral ischemic attack, unspecified: Secondary | ICD-10-CM

## 2018-09-21 LAB — POCT INR: INR: 2.7 (ref 2.0–3.0)

## 2018-09-21 NOTE — Patient Instructions (Signed)
Description   Continue taking 10mg  daily except 5mg  on Mondays and Fridays. Recheck INR in 6 weeks.  Call us with any medication changes or concerns Coumadin Clinic [phone], Main # [phone].

## 2018-09-29 DIAGNOSIS — I1310 Hypertensive heart and chronic kidney disease without heart failure, with stage 1 through stage 4 chronic kidney disease, or unspecified chronic kidney disease: Secondary | ICD-10-CM | POA: Diagnosis not present

## 2018-09-29 DIAGNOSIS — Z6827 Body mass index (BMI) 27.0-27.9, adult: Secondary | ICD-10-CM | POA: Diagnosis not present

## 2018-09-29 DIAGNOSIS — J449 Chronic obstructive pulmonary disease, unspecified: Secondary | ICD-10-CM | POA: Diagnosis not present

## 2018-09-29 DIAGNOSIS — E1122 Type 2 diabetes mellitus with diabetic chronic kidney disease: Secondary | ICD-10-CM | POA: Diagnosis not present

## 2018-09-29 DIAGNOSIS — E7849 Other hyperlipidemia: Secondary | ICD-10-CM | POA: Diagnosis not present

## 2018-09-29 DIAGNOSIS — E663 Overweight: Secondary | ICD-10-CM | POA: Diagnosis not present

## 2018-10-03 ENCOUNTER — Encounter (HOSPITAL_COMMUNITY): Payer: Medicare Other

## 2018-10-18 ENCOUNTER — Other Ambulatory Visit: Payer: Self-pay | Admitting: Cardiovascular Disease

## 2018-10-18 NOTE — Telephone Encounter (Signed)
Rx request sent to pharmacy.  

## 2018-10-30 ENCOUNTER — Other Ambulatory Visit: Payer: Self-pay | Admitting: Cardiovascular Disease

## 2018-10-31 ENCOUNTER — Ambulatory Visit (INDEPENDENT_AMBULATORY_CARE_PROVIDER_SITE_OTHER): Payer: Medicare Other | Admitting: Pharmacist

## 2018-10-31 DIAGNOSIS — Z5181 Encounter for therapeutic drug level monitoring: Secondary | ICD-10-CM | POA: Diagnosis not present

## 2018-10-31 DIAGNOSIS — I359 Nonrheumatic aortic valve disorder, unspecified: Secondary | ICD-10-CM

## 2018-10-31 DIAGNOSIS — G459 Transient cerebral ischemic attack, unspecified: Secondary | ICD-10-CM

## 2018-10-31 LAB — POCT INR: INR: 2.5 (ref 2.0–3.0)

## 2018-10-31 NOTE — Patient Instructions (Signed)
Description   Continue taking 10mg  daily except 5mg  on Mondays and Fridays. Recheck INR in 6 weeks.  Call us with any medication changes or concerns [facility] [phone], Main # [phone].

## 2018-12-12 ENCOUNTER — Encounter (INDEPENDENT_AMBULATORY_CARE_PROVIDER_SITE_OTHER): Payer: Self-pay

## 2018-12-12 ENCOUNTER — Ambulatory Visit (INDEPENDENT_AMBULATORY_CARE_PROVIDER_SITE_OTHER): Payer: Medicare Other | Admitting: *Deleted

## 2018-12-12 DIAGNOSIS — Z5181 Encounter for therapeutic drug level monitoring: Secondary | ICD-10-CM

## 2018-12-12 DIAGNOSIS — I359 Nonrheumatic aortic valve disorder, unspecified: Secondary | ICD-10-CM

## 2018-12-12 DIAGNOSIS — G459 Transient cerebral ischemic attack, unspecified: Secondary | ICD-10-CM | POA: Diagnosis not present

## 2018-12-12 LAB — POCT INR: INR: 2 (ref 2.0–3.0)

## 2018-12-12 NOTE — Patient Instructions (Signed)
Description   Today take take another 5mg  (total for today 10mg ) then continue taking 10mg  daily except 5mg  on Mondays and Fridays. Recheck INR in 4 weeks.  Call us with any medication changes or concerns [facility] [phone], Main # [phone].

## 2019-01-09 ENCOUNTER — Ambulatory Visit (INDEPENDENT_AMBULATORY_CARE_PROVIDER_SITE_OTHER): Payer: Medicare Other

## 2019-01-09 DIAGNOSIS — Z5181 Encounter for therapeutic drug level monitoring: Secondary | ICD-10-CM | POA: Diagnosis not present

## 2019-01-09 DIAGNOSIS — I359 Nonrheumatic aortic valve disorder, unspecified: Secondary | ICD-10-CM | POA: Diagnosis not present

## 2019-01-09 DIAGNOSIS — G459 Transient cerebral ischemic attack, unspecified: Secondary | ICD-10-CM

## 2019-01-09 LAB — POCT INR: INR: 2.3 (ref 2.0–3.0)

## 2019-01-09 NOTE — Patient Instructions (Signed)
Description   Today take take another 5mg  (total for today 10mg ) then start taking 10mg  daily except 5mg  on Fridays. Recheck INR in 3 weeks.  Call us with any medication changes or concerns [facility] [phone], Main # [phone].

## 2019-01-20 ENCOUNTER — Other Ambulatory Visit: Payer: Self-pay | Admitting: Cardiovascular Disease

## 2019-01-23 ENCOUNTER — Encounter: Payer: Self-pay | Admitting: Adult Health

## 2019-02-01 DIAGNOSIS — E1122 Type 2 diabetes mellitus with diabetic chronic kidney disease: Secondary | ICD-10-CM | POA: Diagnosis not present

## 2019-02-01 DIAGNOSIS — R82998 Other abnormal findings in urine: Secondary | ICD-10-CM | POA: Diagnosis not present

## 2019-02-01 DIAGNOSIS — Z125 Encounter for screening for malignant neoplasm of prostate: Secondary | ICD-10-CM | POA: Diagnosis not present

## 2019-02-01 DIAGNOSIS — E7849 Other hyperlipidemia: Secondary | ICD-10-CM | POA: Diagnosis not present

## 2019-02-01 DIAGNOSIS — I10 Essential (primary) hypertension: Secondary | ICD-10-CM | POA: Diagnosis not present

## 2019-02-02 ENCOUNTER — Other Ambulatory Visit: Payer: Self-pay | Admitting: Cardiovascular Disease

## 2019-02-09 DIAGNOSIS — I1310 Hypertensive heart and chronic kidney disease without heart failure, with stage 1 through stage 4 chronic kidney disease, or unspecified chronic kidney disease: Secondary | ICD-10-CM | POA: Diagnosis not present

## 2019-02-09 DIAGNOSIS — R972 Elevated prostate specific antigen [PSA]: Secondary | ICD-10-CM | POA: Diagnosis not present

## 2019-02-09 DIAGNOSIS — I700 Atherosclerosis of aorta: Secondary | ICD-10-CM | POA: Diagnosis not present

## 2019-02-09 DIAGNOSIS — E1122 Type 2 diabetes mellitus with diabetic chronic kidney disease: Secondary | ICD-10-CM | POA: Diagnosis not present

## 2019-02-09 DIAGNOSIS — Z1331 Encounter for screening for depression: Secondary | ICD-10-CM | POA: Diagnosis not present

## 2019-02-09 DIAGNOSIS — Z952 Presence of prosthetic heart valve: Secondary | ICD-10-CM | POA: Diagnosis not present

## 2019-02-09 DIAGNOSIS — I701 Atherosclerosis of renal artery: Secondary | ICD-10-CM | POA: Diagnosis not present

## 2019-02-09 DIAGNOSIS — J449 Chronic obstructive pulmonary disease, unspecified: Secondary | ICD-10-CM | POA: Diagnosis not present

## 2019-02-09 DIAGNOSIS — N182 Chronic kidney disease, stage 2 (mild): Secondary | ICD-10-CM | POA: Diagnosis not present

## 2019-02-09 DIAGNOSIS — Z0000 Encounter for general adult medical examination without abnormal findings: Secondary | ICD-10-CM | POA: Diagnosis not present

## 2019-02-09 DIAGNOSIS — I771 Stricture of artery: Secondary | ICD-10-CM | POA: Diagnosis not present

## 2019-02-09 DIAGNOSIS — I739 Peripheral vascular disease, unspecified: Secondary | ICD-10-CM | POA: Diagnosis not present

## 2019-02-09 DIAGNOSIS — Z125 Encounter for screening for malignant neoplasm of prostate: Secondary | ICD-10-CM | POA: Diagnosis not present

## 2019-02-09 DIAGNOSIS — R808 Other proteinuria: Secondary | ICD-10-CM | POA: Diagnosis not present

## 2019-02-13 ENCOUNTER — Other Ambulatory Visit: Payer: Self-pay | Admitting: Cardiovascular Disease

## 2019-02-14 ENCOUNTER — Ambulatory Visit (INDEPENDENT_AMBULATORY_CARE_PROVIDER_SITE_OTHER): Payer: Medicare Other | Admitting: *Deleted

## 2019-02-14 DIAGNOSIS — I359 Nonrheumatic aortic valve disorder, unspecified: Secondary | ICD-10-CM | POA: Diagnosis not present

## 2019-02-14 DIAGNOSIS — Z1212 Encounter for screening for malignant neoplasm of rectum: Secondary | ICD-10-CM | POA: Diagnosis not present

## 2019-02-14 DIAGNOSIS — Z5181 Encounter for therapeutic drug level monitoring: Secondary | ICD-10-CM | POA: Diagnosis not present

## 2019-02-14 DIAGNOSIS — G459 Transient cerebral ischemic attack, unspecified: Secondary | ICD-10-CM | POA: Diagnosis not present

## 2019-02-14 LAB — POCT INR: INR: 2.7 (ref 2.0–3.0)

## 2019-02-14 NOTE — Patient Instructions (Signed)
Description   Continue taking 10mg  daily except 5mg  on Fridays. Recheck INR in 4 weeks.  Call us with any medication changes or concerns [facility] Clinic [phone], Main # [phone].

## 2019-02-20 ENCOUNTER — Ambulatory Visit: Payer: Medicare Other | Admitting: Adult Health

## 2019-02-23 ENCOUNTER — Encounter (HOSPITAL_COMMUNITY): Payer: Self-pay | Admitting: Emergency Medicine

## 2019-02-23 ENCOUNTER — Inpatient Hospital Stay (HOSPITAL_COMMUNITY)
Admission: EM | Admit: 2019-02-23 | Discharge: 2019-03-01 | DRG: 378 | Disposition: A | Discharge status: Home / Self Care | Payer: Medicare Other | Attending: Internal Medicine | Admitting: Internal Medicine

## 2019-02-23 ENCOUNTER — Encounter: Payer: Self-pay | Admitting: Internal Medicine

## 2019-02-23 ENCOUNTER — Other Ambulatory Visit: Payer: Self-pay

## 2019-02-23 ENCOUNTER — Emergency Department (HOSPITAL_COMMUNITY): Payer: Medicare Other

## 2019-02-23 DIAGNOSIS — D62 Acute posthemorrhagic anemia: Secondary | ICD-10-CM | POA: Diagnosis present

## 2019-02-23 DIAGNOSIS — E785 Hyperlipidemia, unspecified: Secondary | ICD-10-CM | POA: Diagnosis present

## 2019-02-23 DIAGNOSIS — R0602 Shortness of breath: Secondary | ICD-10-CM | POA: Diagnosis not present

## 2019-02-23 DIAGNOSIS — Z7982 Long term (current) use of aspirin: Secondary | ICD-10-CM | POA: Diagnosis not present

## 2019-02-23 DIAGNOSIS — I10 Essential (primary) hypertension: Secondary | ICD-10-CM | POA: Diagnosis not present

## 2019-02-23 DIAGNOSIS — Z9081 Acquired absence of spleen: Secondary | ICD-10-CM

## 2019-02-23 DIAGNOSIS — Z952 Presence of prosthetic heart valve: Secondary | ICD-10-CM

## 2019-02-23 DIAGNOSIS — Z955 Presence of coronary angioplasty implant and graft: Secondary | ICD-10-CM

## 2019-02-23 DIAGNOSIS — Z961 Presence of intraocular lens: Secondary | ICD-10-CM | POA: Diagnosis present

## 2019-02-23 DIAGNOSIS — R791 Abnormal coagulation profile: Secondary | ICD-10-CM | POA: Diagnosis present

## 2019-02-23 DIAGNOSIS — D649 Anemia, unspecified: Secondary | ICD-10-CM | POA: Diagnosis not present

## 2019-02-23 DIAGNOSIS — K254 Chronic or unspecified gastric ulcer with hemorrhage: Secondary | ICD-10-CM | POA: Diagnosis present

## 2019-02-23 DIAGNOSIS — I2510 Atherosclerotic heart disease of native coronary artery without angina pectoris: Secondary | ICD-10-CM | POA: Diagnosis present

## 2019-02-23 DIAGNOSIS — K921 Melena: Secondary | ICD-10-CM | POA: Diagnosis present

## 2019-02-23 DIAGNOSIS — K922 Gastrointestinal hemorrhage, unspecified: Secondary | ICD-10-CM | POA: Diagnosis not present

## 2019-02-23 DIAGNOSIS — Z7901 Long term (current) use of anticoagulants: Secondary | ICD-10-CM | POA: Diagnosis not present

## 2019-02-23 DIAGNOSIS — I70209 Unspecified atherosclerosis of native arteries of extremities, unspecified extremity: Secondary | ICD-10-CM | POA: Diagnosis present

## 2019-02-23 DIAGNOSIS — R42 Dizziness and giddiness: Secondary | ICD-10-CM | POA: Diagnosis not present

## 2019-02-23 DIAGNOSIS — Z8249 Family history of ischemic heart disease and other diseases of the circulatory system: Secondary | ICD-10-CM | POA: Diagnosis not present

## 2019-02-23 DIAGNOSIS — E119 Type 2 diabetes mellitus without complications: Secondary | ICD-10-CM

## 2019-02-23 DIAGNOSIS — E1149 Type 2 diabetes mellitus with other diabetic neurological complication: Secondary | ICD-10-CM | POA: Diagnosis not present

## 2019-02-23 DIAGNOSIS — Z86010 Personal history of colonic polyps: Secondary | ICD-10-CM | POA: Diagnosis not present

## 2019-02-23 DIAGNOSIS — D6832 Hemorrhagic disorder due to extrinsic circulating anticoagulants: Secondary | ICD-10-CM | POA: Diagnosis present

## 2019-02-23 DIAGNOSIS — Z951 Presence of aortocoronary bypass graft: Secondary | ICD-10-CM

## 2019-02-23 DIAGNOSIS — Z9842 Cataract extraction status, left eye: Secondary | ICD-10-CM | POA: Diagnosis not present

## 2019-02-23 DIAGNOSIS — I248 Other forms of acute ischemic heart disease: Secondary | ICD-10-CM | POA: Diagnosis not present

## 2019-02-23 DIAGNOSIS — E1151 Type 2 diabetes mellitus with diabetic peripheral angiopathy without gangrene: Secondary | ICD-10-CM | POA: Diagnosis present

## 2019-02-23 DIAGNOSIS — Z6826 Body mass index (BMI) 26.0-26.9, adult: Secondary | ICD-10-CM | POA: Diagnosis not present

## 2019-02-23 DIAGNOSIS — Z79899 Other long term (current) drug therapy: Secondary | ICD-10-CM

## 2019-02-23 DIAGNOSIS — K259 Gastric ulcer, unspecified as acute or chronic, without hemorrhage or perforation: Secondary | ICD-10-CM | POA: Diagnosis not present

## 2019-02-23 DIAGNOSIS — Z8673 Personal history of transient ischemic attack (TIA), and cerebral infarction without residual deficits: Secondary | ICD-10-CM

## 2019-02-23 DIAGNOSIS — Z87891 Personal history of nicotine dependence: Secondary | ICD-10-CM | POA: Diagnosis not present

## 2019-02-23 DIAGNOSIS — Z9841 Cataract extraction status, right eye: Secondary | ICD-10-CM | POA: Diagnosis not present

## 2019-02-23 DIAGNOSIS — I1310 Hypertensive heart and chronic kidney disease without heart failure, with stage 1 through stage 4 chronic kidney disease, or unspecified chronic kidney disease: Secondary | ICD-10-CM | POA: Diagnosis not present

## 2019-02-23 LAB — PREPARE RBC (CROSSMATCH)

## 2019-02-23 LAB — CBC WITH DIFFERENTIAL/PLATELET
Abs Immature Granulocytes: 0.22 10*3/uL — ABNORMAL HIGH (ref 0.00–0.07)
BASOS PCT: 1 %
Basophils Absolute: 0.1 10*3/uL (ref 0.0–0.1)
Eosinophils Absolute: 0.1 10*3/uL (ref 0.0–0.5)
Eosinophils Relative: 1 %
HCT: 24.4 % — ABNORMAL LOW (ref 39.0–52.0)
Hemoglobin: 8 g/dL — ABNORMAL LOW (ref 13.0–17.0)
Immature Granulocytes: 2 %
Lymphocytes Relative: 29 %
Lymphs Abs: 3.1 10*3/uL (ref 0.7–4.0)
MCH: 31.6 pg (ref 26.0–34.0)
MCHC: 32.8 g/dL (ref 30.0–36.0)
MCV: 96.4 fL (ref 80.0–100.0)
Monocytes Absolute: 1.2 10*3/uL — ABNORMAL HIGH (ref 0.1–1.0)
Monocytes Relative: 11 %
NRBC: 1.9 % — AB (ref 0.0–0.2)
Neutro Abs: 6.1 10*3/uL (ref 1.7–7.7)
Neutrophils Relative %: 56 %
Platelets: 254 10*3/uL (ref 150–400)
RBC: 2.53 MIL/uL — ABNORMAL LOW (ref 4.22–5.81)
RDW: 14 % (ref 11.5–15.5)
WBC: 10.7 10*3/uL — ABNORMAL HIGH (ref 4.0–10.5)

## 2019-02-23 LAB — COMPREHENSIVE METABOLIC PANEL
ALT: 49 U/L — AB (ref 0–44)
AST: 40 U/L (ref 15–41)
Albumin: 3.4 g/dL — ABNORMAL LOW (ref 3.5–5.0)
Alkaline Phosphatase: 41 U/L (ref 38–126)
Anion gap: 10 (ref 5–15)
BUN: 42 mg/dL — ABNORMAL HIGH (ref 8–23)
CO2: 25 mmol/L (ref 22–32)
Calcium: 9.6 mg/dL (ref 8.9–10.3)
Chloride: 100 mmol/L (ref 98–111)
Creatinine, Ser: 1.04 mg/dL (ref 0.61–1.24)
GFR calc Af Amer: >60 mL/min (ref >60)
GFR calc non Af Amer: >60 mL/min (ref >60)
Glucose, Bld: 299 mg/dL — ABNORMAL HIGH (ref 70–99)
Potassium: 4.4 mmol/L (ref 3.5–5.1)
Sodium: 135 mmol/L (ref 135–145)
Total Bilirubin: 0.5 mg/dL (ref 0.3–1.2)
Total Protein: 5.7 g/dL — ABNORMAL LOW (ref 6.5–8.1)

## 2019-02-23 LAB — LIPASE, BLOOD: Lipase: 32 U/L (ref 11–51)

## 2019-02-23 LAB — POC OCCULT BLOOD, ED
Fecal Occult Bld: POSITIVE — AB
Fecal Occult Bld: POSITIVE — AB

## 2019-02-23 LAB — TROPONIN I: Troponin I: <0.03 ng/mL (ref <0.03)

## 2019-02-23 LAB — PROTIME-INR
INR: 7.5 (ref 0.8–1.2)
Prothrombin Time: 62.5 seconds — ABNORMAL HIGH (ref 11.4–15.2)

## 2019-02-23 LAB — GLUCOSE, CAPILLARY: Glucose-Capillary: 173 mg/dL — ABNORMAL HIGH (ref 70–99)

## 2019-02-23 MED ORDER — ATORVASTATIN CALCIUM 80 MG PO TABS
80.0000 mg | ORAL_TABLET | Freq: Every day | ORAL | Status: DC
  Administered 2019-02-24 – 2019-03-01 (×6): 80 mg via ORAL
  Filled 2019-02-23 (×6): qty 1

## 2019-02-23 MED ORDER — ONDANSETRON HCL 4 MG PO TABS: 4.0000 mg | ORAL_TABLET | Freq: Four times a day (QID) | ORAL | Status: DC | PRN

## 2019-02-23 MED ORDER — ACETAMINOPHEN 650 MG RE SUPP: 650.0000 mg | Freq: Four times a day (QID) | RECTAL | Status: DC | PRN

## 2019-02-23 MED ORDER — PHYTONADIONE 5 MG PO TABS
5.0000 mg | ORAL_TABLET | Freq: Once | ORAL | Status: AC
  Administered 2019-02-23: 5 mg via ORAL
  Filled 2019-02-23: qty 1

## 2019-02-23 MED ORDER — PANTOPRAZOLE SODIUM 40 MG IV SOLR: 40.0000 mg | Freq: Two times a day (BID) | INTRAVENOUS | Status: DC

## 2019-02-23 MED ORDER — SODIUM CHLORIDE 0.9 % IV SOLN
10.0000 mL/h | Freq: Once | INTRAVENOUS | Status: AC
  Administered 2019-02-23: 10 mL/h via INTRAVENOUS

## 2019-02-23 MED ORDER — SODIUM CHLORIDE 0.9 % IV SOLN
8.0000 mg/h | INTRAVENOUS | Status: AC
  Administered 2019-02-23 – 2019-02-26 (×7): 8 mg/h via INTRAVENOUS
  Filled 2019-02-23 (×7): qty 80

## 2019-02-23 MED ORDER — ACETAMINOPHEN 325 MG PO TABS: 650.0000 mg | ORAL_TABLET | Freq: Four times a day (QID) | ORAL | Status: DC | PRN

## 2019-02-23 MED ORDER — SODIUM CHLORIDE 0.9 % IV SOLN
80.0000 mg | Freq: Once | INTRAVENOUS | Status: AC
  Administered 2019-02-23: 80 mg via INTRAVENOUS
  Filled 2019-02-23: qty 80

## 2019-02-23 MED ORDER — OMEGA-3-ACID ETHYL ESTERS 1 G PO CAPS
1.0000 g | ORAL_CAPSULE | Freq: Two times a day (BID) | ORAL | Status: DC
  Administered 2019-02-23 – 2019-03-01 (×12): 1 g via ORAL
  Filled 2019-02-23 (×12): qty 1

## 2019-02-23 MED ORDER — INSULIN ASPART 100 UNIT/ML ~~LOC~~ SOLN
0.0000 [IU] | SUBCUTANEOUS | Status: DC
  Administered 2019-02-23 – 2019-02-24 (×2): 2 [IU] via SUBCUTANEOUS
  Administered 2019-02-24: 1 [IU] via SUBCUTANEOUS
  Administered 2019-02-24: 2 [IU] via SUBCUTANEOUS
  Administered 2019-02-24: 1 [IU] via SUBCUTANEOUS
  Administered 2019-02-24: 9 [IU] via SUBCUTANEOUS
  Administered 2019-02-25: 1 [IU] via SUBCUTANEOUS
  Administered 2019-02-25: 3 [IU] via SUBCUTANEOUS
  Administered 2019-02-25 (×2): 2 [IU] via SUBCUTANEOUS
  Administered 2019-02-25: 1 [IU] via SUBCUTANEOUS
  Administered 2019-02-26: 3 [IU] via SUBCUTANEOUS
  Administered 2019-02-26: 2 [IU] via SUBCUTANEOUS
  Administered 2019-02-26 (×2): 1 [IU] via SUBCUTANEOUS
  Administered 2019-02-27: 2 [IU] via SUBCUTANEOUS
  Administered 2019-02-27: 1 [IU] via SUBCUTANEOUS

## 2019-02-23 MED ORDER — ONDANSETRON HCL 4 MG/2ML IJ SOLN: 4.0000 mg | Freq: Four times a day (QID) | INTRAMUSCULAR | Status: DC | PRN

## 2019-02-23 NOTE — ED Triage Notes (Signed)
Pt arrives POV with complaints of black colored stool X3 Pt denies iron pills no bright red blood , pt is on coumadin

## 2019-02-23 NOTE — ED Provider Notes (Signed)
[facility] EMERGENCY DEPARTMENT Provider Note   CSN: [id number] Arrival date & time: 02/23/19  1725    History   Chief Complaint Chief Complaint  Patient presents with  . GI Bleeding    HPI [name] [name] is a 73 y.o. male.     HPI Patient presents with dark stool for the past 3 days.  States he has had mild upper abdominal pain.  Endorses lightheadedness and dyspnea with exertion.  Was seen by Dr. Virgina Jock, his primary physician.  Noted to have drop in his hemoglobin from 16.3 to 8.3 over the last month.  Concerned for upper GI bleed.  Patient is on Coumadin for mechanical atrial valve replacement.  Denies chest pain, nausea, vomiting, fever or chills. Past Medical History:  Diagnosis Date  . Adenomatous colon polyp 09/1997  . Anemia   . Aortic stenosis    s/p st[facility] mechanical AVR - Chronic Coumadin  . Blood transfusion    "related to ITP"  . Coronary artery disease    s/p cabg x 3 11/2003: lima-lad, seq vg to rpda and rpl  . Diverticulitis of colon   . Heart murmur   . Hyperlipidemia   . Hypertension   . ITP (idiopathic thrombocytopenic purpura)   . Peripheral arterial disease ([place])    a. history of aortobifemoral bypass grafting by Dr. Sherren Mocha early b. LE angiography 04/22/2015 patent aortobifem graft, DES to R SFA  . Peripheral vascular disease ([place])    s/p Left external Iliac Artery stenting and subsequent left femoral endarterectomy 02/2011- post op course complicated by wound infxn req I&D 03/2011  . Renal artery stenosis, native, bilateral (HCC)    a. bilateral renal artery stenosis by recent duplex ultrasound b. L renal artery stent 02/2015, R renal artery patent on angiogram  . Stroke ([place])   . TIA (transient ischemic attack) ~ 2013  . Type II diabetes mellitus [facility])     Patient Active Problem List   Diagnosis Date Noted  . Supratherapeutic INR 02/23/2019  . Gastrointestinal hemorrhage with melena 02/23/2019  . Acute blood loss anemia  02/23/2019  . Symptomatic anemia 02/23/2019  . TIA (transient ischemic attack) 12/20/2017  . Stroke-like symptoms 12/14/2017  . Claudication ([place]) 10/01/2016  . Renal artery arteriosclerosis ([place]) 02/22/2015  . Renal artery stenosis ([place]) 02/21/2015  . Renal artery stenosis, native, bilateral ([place]) 02/08/2015  . H/O mechanical aortic valve replacement 07/20/2014  . Encounter for therapeutic drug monitoring 03/13/2014  . Cerebral infarction ([place]) 05/05/2013  . Aortoiliac occlusive disease ([place]) 01/10/2013  . PVD (peripheral vascular disease) with claudication ([place]) 02/02/2012  . Atherosclerosis of native arteries of extremity with intermittent claudication ([place]) 01/12/2012  . Coronary artery disease   . Aortic stenosis   . Peripheral vascular disease ([place])   . Hypertension   . Hyperlipidemia   . ITP (idiopathic thrombocytopenic purpura)   . Coronary atherosclerosis 10/21/2010  . Transient cerebral ischemia 10/10/2009  . Diabetes ([place]) 10/22/2008  . HYPERCHOLESTEROLEMIA 10/22/2008  . Immune thrombocytopenic purpura ([place]) 10/22/2008  . Essential hypertension 10/22/2008  . Aortic valve disorder 10/22/2008  . PVD (peripheral vascular disease) ([place]) 10/22/2008  . DIVERTICULITIS, COLON 10/22/2008  . COLONIC POLYPS, HX OF 10/22/2008    Past Surgical History:  Procedure Laterality Date  . ABDOMINAL AORTAGRAM N/A 12/16/2011   Procedure: ABDOMINAL Maxcine Ham;  Surgeon: Sherren Mocha, MD;  Location: [facility] CATH LAB;  Service: Cardiovascular;  Laterality: N/A;  . ANGIOPLASTY / STENTING ILIAC     Left external  Iliac Artery  . AORTA - BILATERAL FEMORAL ARTERY BYPASS GRAFT  01/18/2012   Procedure: AORTA BIFEMORAL BYPASS GRAFT;  Surgeon: Curt Jews, MD;  Location: [facility];  Service: Vascular;  Laterality: N/A;  . AORTIC VALVE REPLACEMENT  ~ 2004  . CARDIAC CATHETERIZATION  11/2003   /pt report 10/01/2016  . CARDIAC VALVE REPLACEMENT  11/2003   aortic  . CATARACT EXTRACTION W/ INTRAOCULAR LENS   IMPLANT, BILATERAL Bilateral   . CORONARY ARTERY BYPASS GRAFT  11/2003   Archie Endo 04/21/2011  . LOWER EXTREMITY ANGIOGRAM N/A 02/21/2015   Procedure: LOWER EXTREMITY ANGIOGRAM;  Surgeon: Lorretta Harp, MD;  Location: [facility] CATH LAB;  Service: Cardiovascular;  Laterality: N/A;  . PERIPHERAL VASCULAR CATHETERIZATION N/A 04/22/2015   Procedure: Lower Extremity Angiography;  Surgeon: Lorretta Harp, MD;  Location: [facility] CV LAB;  Service: Cardiovascular;  Laterality: N/A;  . PERIPHERAL VASCULAR CATHETERIZATION N/A 08/24/2016   Procedure: Lower Extremity Angiography;  Surgeon: Lorretta Harp, MD;  Location: [facility] CV LAB;  Service: Cardiovascular;  Laterality: N/A;  . PERIPHERAL VASCULAR CATHETERIZATION Right 10/01/2016   Procedure: Peripheral Vascular Intervention - STENT;  Surgeon: Lorretta Harp, MD;  Location: [facility] CV LAB;  Service: Cardiovascular;  Laterality: Right;  Prox and MID SFA   . RENAL ANGIOGRAM N/A 02/21/2015   Procedure: RENAL ANGIOGRAM;  Surgeon: Lorretta Harp, MD;  Location[facility] CATH LAB;  Service: Cardiovascular;  Laterality: Bilateral; 6 mm x 12 mm long Herculink balloon expandable stent to the left renal artery  . RENAL ARTERY STENT Left 04/22/2015   dr berry  . SPLENECTOMY  02/2003   Archie Endo 04/21/2011  . TONSILLECTOMY  ~ 1952        Home Medications    Prior to Admission medications   Medication Sig Start Date End Date Taking? Authorizing Provider  amLODipine (NORVASC) 10 MG tablet Take 10 mg by mouth daily. 02/11/15  Yes [provider]  aspirin EC 81 MG tablet Take 81 mg by mouth daily.   Yes [provider]  atorvastatin (LIPITOR) 80 MG tablet Take 80 mg by mouth daily.    Yes [provider]  canagliflozin (INVOKANA) 100 MG TABS tablet Take 100 mg by mouth daily.   Yes [provider]  Evolocumab (REPATHA SURECLICK) 956 MG/ML SOAJ Inject 140 mg into the skin every 14 (fourteen) days.   Yes [provider]   glyBURIDE (DIABETA) 2.5 MG tablet Take 5 mg by mouth 2 (two) times daily with a meal.    Yes [provider]  hydrochlorothiazide (MICROZIDE) 12.5 MG capsule TAKE 1 CAPSULE BY MOUTH EVERY DAY Patient taking differently: Take 12.5 mg by mouth daily.  02/02/19  Yes Josue Hector, MD  metoprolol succinate (TOPROL-XL) 50 MG 24 hr tablet TAKE 1 TABLET (50 MG TOTAL) BY MOUTH DAILY. TAKE WITH OR IMMEDIATELY FOLLOWING A MEAL. 01/20/19  Yes Lorretta Harp, MD  omega-3 acid ethyl esters (LOVAZA) 1 g capsule Take 1 capsule (1 g total) by mouth 2 (two) times daily. 12/18/17  Yes Hongalgi, Lenis Dickinson, MD  sildenafil (REVATIO) 20 MG tablet Take 100 mg by mouth daily as needed (erectile dysfunction).    Yes [provider]  trandolapril (MAVIK) 4 MG tablet TAKE 1 TABLET BY MOUTH EVERY DAY Patient taking differently: Take 4 mg by mouth daily.  10/18/18  Yes Lorretta Harp, MD  warfarin (COUMADIN) 10 MG tablet TAKE AS DIRECTED BY COUMADIN CLINIC Patient taking differently: Take 5-10 mg  by mouth See admin instructions. Take 1 tablet by mouth everyday Take 1/2 tablet by mouth on fridays 07/14/18  Yes Josue Hector, MD    Family History Family History  Problem Relation Age of Onset  . Coronary artery disease Mother        bypass surgery - deceased  . Heart disease Father        murmur, valve replacement - deceased  . Breast cancer Sister   . Diabetes Other        grandmother  . Diabetes Paternal Grandmother   . Diabetes Paternal Aunt   . Colon cancer Neg Hx     Social History Social History   Tobacco Use  . Smoking status: Former Smoker    Years: 30.00    Types: Cigarettes, Cigars    Last attempt to quit: 12/15/1993    Years since quitting: 25.2  . Smokeless tobacco: Never Used  Substance Use Topics  . Alcohol use: Yes    Alcohol/week: 16.0 standard drinks    Types: 1 Cans of beer, 1 Shots of liquor, 14 Standard drinks or equivalent per week    Comment: drinks 2 martini's a  night (2 shots in each)  . Drug use: No     Allergies   Patient has no known allergies.   Review of Systems Review of Systems  Constitutional: Positive for fatigue. Negative for chills and fever.  HENT: Negative for sore throat.   Eyes: Negative for visual disturbance.  Respiratory: Positive for shortness of breath. Negative for cough.   Cardiovascular: Negative for chest pain.  Gastrointestinal: Positive for abdominal pain. Negative for constipation, diarrhea, nausea and vomiting.  Genitourinary: Negative for flank pain, frequency and hematuria.  Musculoskeletal: Negative for back pain, myalgias and neck pain.  Skin: Negative for rash and wound.  Neurological: Positive for light-headedness. Negative for weakness, numbness and headaches.  All other systems reviewed and are negative.    Physical Exam Updated Vital Signs BP (!) 153/34   Pulse 82   Temp 98.4 F (36.9 C) (Oral)   Resp 16   Ht 5\' 8"  (1.727 m)   Wt 77.1 kg   SpO2 94%   BMI 25.85 kg/m   Physical Exam Vitals signs and nursing note reviewed.  Constitutional:      General: He is not in acute distress.    Appearance: Normal appearance. He is well-developed. He is not ill-appearing.  HENT:     Head: Normocephalic and atraumatic.     Nose: Nose normal.     Mouth/Throat:     Mouth: Mucous membranes are moist.  Eyes:     Extraocular Movements: Extraocular movements intact.     Pupils: Pupils are equal, round, and reactive to light.  Neck:     Musculoskeletal: Normal range of motion and neck supple. No neck rigidity or muscular tenderness.  Cardiovascular:     Rate and Rhythm: Normal rate and regular rhythm.     Comments: Mechanical heart sounds Pulmonary:     Effort: Pulmonary effort is normal.     Breath sounds: Normal breath sounds.  Abdominal:     General: Bowel sounds are normal.     Palpations: Abdomen is soft.     Tenderness: There is abdominal tenderness. There is no guarding or rebound.      Comments: Mild epigastric and right upper quadrant tenderness to palpation.  No rebound or guarding.  Genitourinary:    Comments: Melanotic stool on rectal exam Musculoskeletal: Normal range of  motion.        General: No swelling, tenderness, deformity or signs of injury.     Right lower leg: No edema.     Left lower leg: No edema.  Lymphadenopathy:     Cervical: No cervical adenopathy.  Skin:    General: Skin is warm and dry.     Capillary Refill: Capillary refill takes less than 2 seconds.     Findings: No erythema or rash.  Neurological:     General: No focal deficit present.     Mental Status: He is alert and oriented to person, place, and time.  Psychiatric:        Mood and Affect: Mood normal.        Behavior: Behavior normal.      ED Treatments / Results  Labs (all labs ordered are listed, but only abnormal results are displayed) Labs Reviewed  CBC WITH DIFFERENTIAL/PLATELET - Abnormal; Notable for the following components:      Result Value   WBC 10.7 (*)    RBC 2.53 (*)    Hemoglobin 8.0 (*)    HCT 24.4 (*)    nRBC 1.9 (*)    Monocytes Absolute 1.2 (*)    Abs Immature Granulocytes 0.22 (*)    All other components within normal limits  COMPREHENSIVE METABOLIC PANEL - Abnormal; Notable for the following components:   Glucose, Bld 299 (*)    BUN 42 (*)    Total Protein 5.7 (*)    Albumin 3.4 (*)    ALT 49 (*)    All other components within normal limits  PROTIME-INR - Abnormal; Notable for the following components:   Prothrombin Time 62.5 (*)    INR 7.5 (*)    All other components within normal limits  POC OCCULT BLOOD, ED - Abnormal; Notable for the following components:   Fecal Occult Bld POSITIVE (*)    All other components within normal limits  POC OCCULT BLOOD, ED - Abnormal; Notable for the following components:   Fecal Occult Bld POSITIVE (*)    All other components within normal limits  LIPASE, BLOOD  TROPONIN I  OCCULT BLOOD X 1 CARD TO LAB,  STOOL  PROTIME-INR  BASIC METABOLIC PANEL  HEMOGLOBIN AND HEMATOCRIT, BLOOD  HEMOGLOBIN AND HEMATOCRIT, BLOOD  TYPE AND SCREEN  PREPARE RBC (CROSSMATCH)    EKG EKG Interpretation  Date/Time:  Thursday February 23 2019 18:04:54 EDT Ventricular Rate:  82 PR Interval:    QRS Duration: 96 QT Interval:  351 QTC Calculation: 410 R Axis:   82 Text Interpretation:  Sinus rhythm Borderline right axis deviation Nonspecific T abnormalities, diffuse leads Confirmed by Julianne Rice [phone]) on 02/23/2019 6:18:42 PM   Radiology Dg Chest Port 1 View  Result Date: 02/23/2019 CLINICAL DATA:  Shortness of breath EXAM: PORTABLE CHEST 1 VIEW COMPARISON:  12/20/2017 FINDINGS: The heart size and mediastinal contours are within normal limits. Both lungs are clear. Remote median sternotomy. The visualized skeletal structures are unremarkable. IMPRESSION: No active disease. Electronically Signed   By: Ulyses Jarred M.D.   On: 02/23/2019 18:59    Procedures Procedures (including critical care time)  Medications Ordered in ED Medications  pantoprazole (PROTONIX) 80 mg in sodium chloride 0.9 % 250 mL (0.32 mg/mL) infusion (8 mg/hr Intravenous New Bag/Given 02/23/19 1858)  pantoprazole (PROTONIX) injection 40 mg (has no administration in time range)  acetaminophen (TYLENOL) tablet 650 mg (has no administration in time range)    Or  acetaminophen (TYLENOL) suppository  650 mg (has no administration in time range)  ondansetron (ZOFRAN) tablet 4 mg (has no administration in time range)    Or  ondansetron (ZOFRAN) injection 4 mg (has no administration in time range)  insulin aspart (novoLOG) injection 0-9 Units (has no administration in time range)  atorvastatin (LIPITOR) tablet 80 mg (has no administration in time range)  omega-3 acid ethyl esters (LOVAZA) capsule 1 g (has no administration in time range)  pantoprazole (PROTONIX) 80 mg in sodium chloride 0.9 % 100 mL IVPB (0 mg Intravenous Stopped 02/23/19  1914)  0.9 %  sodium chloride infusion (10 mL/hr Intravenous New Bag/Given 02/23/19 1923)  phytonadione (VITAMIN K) tablet 5 mg (5 mg Oral Given 02/23/19 2037)   CRITICAL CARE Performed by: Julianne Rice Total critical care time: 60 minutes Critical care time was exclusive of separately billable procedures and treating other patients. Critical care was necessary to treat or prevent imminent or life-threatening deterioration. Critical care was time spent personally by me on the following activities: development of treatment plan with patient and/or surrogate as well as nursing, discussions with consultants, evaluation of patient's response to treatment, examination of patient, obtaining history from patient or surrogate, ordering and performing treatments and interventions, ordering and review of laboratory studies, ordering and review of radiographic studies, pulse oximetry and re-evaluation of patient's condition.  Initial Impression / Assessment and Plan / ED Course  I have reviewed the triage vital signs and the nursing notes.  Pertinent labs & imaging results that were available during my care of the patient were reviewed by me and considered in my medical decision making (see chart for details).       Spoke with cardiology who will consult on patient.  Patient is hemodynamically stable.  Likely upper GI bleed.  Will start on Protonix.  Spoke with cardiology who will consult on patient regarding possible Coumadin reversal.  Also spoke with Dr. Hilarie Fredrickson who will also consult on patient.  Agrees with Protonix.  Dr. Meda Coffee evaluated patient.  Recommends 5 mg p.o. vitamin K and holding Coumadin.  Spoke with Dr. Tamala Julian of critical care.  Agrees with blood transfusion and vitamin K.  Thinks patient is appropriate for intermediate bed.  Dr. Alcario Drought will see patient in the emergency department and admit. Final Clinical Impressions(s) / ED Diagnoses   Final diagnoses:  Upper GI bleed   Symptomatic anemia    ED Discharge Orders    None       Julianne Rice, MD 02/23/19 2114

## 2019-02-23 NOTE — H&P (Signed)
History and Physical    [name] [name] OIN:[id number] DOB: 1946-05-01 DOA: 02/23/2019  PCP: Shon Baton, MD  Patient coming from: Home  I have personally briefly reviewed patient's old medical records in [place]  Chief Complaint: Melena  HPI: [name] [name] is a 73 y.o. male with medical history significant of AS s/p mechanical AVR on chronic coumadin, ITP, TIA, CAD s/p CABG, HTN, HLD, PAD, DM2.  Patient presents to the ED at [facility] after being sent in by PCP for melanotic stools.  Patient has 3 day history of dark stool, mild upper abd pain.  Lightheadedness and DOE.  Went in to see PCP today who found that his HGB had dropped to 8.3 from 16.3 02/01/2019.  Patient sent in to [facility] ED for admission.   ED Course: HGB 8, hemoccult positive.  INR 7.5.  BUN 42 creat 1.0.  Cards consulted, EDP also spoke with PCCM and GI as well.  Hospitalist asked to admit.   Review of Systems: As per HPI otherwise 10 point review of systems negative.   Past Medical History:  Diagnosis Date   Adenomatous colon polyp 09/1997   Anemia    Aortic stenosis    s/p st. jude mechanical AVR - Chronic Coumadin   Blood transfusion    "related to ITP"   Coronary artery disease    s/p cabg x 3 11/2003: lima-lad, seq vg to rpda and rpl   Diverticulitis of colon    Heart murmur    Hyperlipidemia    Hypertension    ITP (idiopathic thrombocytopenic purpura)    Peripheral arterial disease ([place])    a. history of aortobifemoral bypass grafting by Dr. Sherren Mocha early b. LE angiography 04/22/2015 patent aortobifem graft, DES to R SFA   Peripheral vascular disease ([place])    s/p Left external Iliac Artery stenting and subsequent left femoral endarterectomy 02/2011- post op course complicated by wound infxn req I&D 03/2011   Renal artery stenosis, native, bilateral ([place])    a. bilateral renal artery stenosis by recent duplex ultrasound b. L renal artery stent 02/2015, R renal artery patent on  angiogram   Stroke [facility])    TIA (transient ischemic attack) ~ 2013   Type II diabetes mellitus ([place])     Past Surgical History:  Procedure Laterality Date   ABDOMINAL AORTAGRAM N/A 12/16/2011   Procedure: ABDOMINAL Maxcine Ham;  Surgeon: Sherren Mocha, MD;  Location: [facility] CATH LAB;  Service: Cardiovascular;  Laterality: N/A;   ANGIOPLASTY / STENTING ILIAC     Left external Iliac Artery   AORTA - BILATERAL FEMORAL ARTERY BYPASS GRAFT  01/18/2012   Procedure: AORTA BIFEMORAL BYPASS GRAFT;  Surgeon: Curt Jews, MD;  Location: [place];  Service: Vascular;  Laterality: N/A;   AORTIC VALVE REPLACEMENT  ~ 2004   CARDIAC CATHETERIZATION  11/2003   /pt report 10/01/2016   CARDIAC VALVE REPLACEMENT  11/2003   aortic   CATARACT EXTRACTION W/ INTRAOCULAR LENS  IMPLANT, BILATERAL Bilateral    CORONARY ARTERY BYPASS GRAFT  11/2003   Archie Endo 04/21/2011   LOWER EXTREMITY ANGIOGRAM N/A 02/21/2015   Procedure: LOWER EXTREMITY ANGIOGRAM;  Surgeon: Lorretta Harp, MD;  Location: [facility] CATH LAB;  Service: Cardiovascular;  Laterality: N/A;   PERIPHERAL VASCULAR CATHETERIZATION N/A 04/22/2015   Procedure: Lower Extremity Angiography;  Surgeon: Lorretta Harp, MD;  Location: [place] CV LAB;  Service: Cardiovascular;  Laterality: N/A;   PERIPHERAL VASCULAR CATHETERIZATION N/A 08/24/2016   Procedure: Lower Extremity Angiography;  Surgeon:  Lorretta Harp, MD;  Location: [place] CV LAB;  Service: Cardiovascular;  Laterality: N/A;   PERIPHERAL VASCULAR CATHETERIZATION Right 10/01/2016   Procedure: Peripheral Vascular Intervention - STENT;  Surgeon: Lorretta Harp, MD;  Location: [place] CV LAB;  Service: Cardiovascular;  Laterality: Right;  Prox and MID SFA    RENAL ANGIOGRAM N/A 02/21/2015   Procedure: RENAL ANGIOGRAM;  Surgeon: Lorretta Harp, MD;  Location: [facility] CATH LAB;  Service: Cardiovascular;  Laterality: Bilateral; 6 mm x 12 mm long Herculink balloon expandable stent to the left renal  artery   RENAL ARTERY STENT Left 04/22/2015   dr berry   SPLENECTOMY  02/2003   Archie Endo 04/21/2011   TONSILLECTOMY  ~ 1952     reports that he quit smoking about 25 years ago. His smoking use included cigarettes and cigars. He quit after 30.00 years of use. He has never used smokeless tobacco. He reports current alcohol use of about 16.0 standard drinks of alcohol per week. He reports that he does not use drugs.  No Known Allergies  Family History  Problem Relation Age of Onset   Coronary artery disease Mother        bypass surgery - deceased   Heart disease Father        murmur, valve replacement - deceased   Breast cancer Sister    Diabetes Other        grandmother   Diabetes Paternal Grandmother    Diabetes Paternal Aunt    Colon cancer Neg Hx      Prior to Admission medications   Medication Sig Start Date End Date Taking? Authorizing Provider  amLODipine (NORVASC) 10 MG tablet Take 10 mg by mouth daily. 02/11/15  Yes [provider]  aspirin EC 81 MG tablet Take 81 mg by mouth daily.   Yes [provider]  atorvastatin (LIPITOR) 80 MG tablet Take 80 mg by mouth daily.    Yes [provider]  canagliflozin (INVOKANA) 100 MG TABS tablet Take 100 mg by mouth daily.   Yes [provider]  Evolocumab (REPATHA SURECLICK) 263 MG/ML SOAJ Inject 140 mg into the skin every 14 (fourteen) days.   Yes [provider]  glyBURIDE (DIABETA) 2.5 MG tablet Take 5 mg by mouth 2 (two) times daily with a meal.    Yes [provider]  hydrochlorothiazide (MICROZIDE) 12.5 MG capsule TAKE 1 CAPSULE BY MOUTH EVERY DAY Patient taking differently: Take 12.5 mg by mouth daily.  02/02/19  Yes Josue Hector, MD  metoprolol succinate (TOPROL-XL) 50 MG 24 hr tablet TAKE 1 TABLET (50 MG TOTAL) BY MOUTH DAILY. TAKE WITH OR IMMEDIATELY FOLLOWING A MEAL. 01/20/19  Yes Lorretta Harp, MD  omega-3 acid ethyl esters (LOVAZA) 1 g capsule Take 1 capsule  (1 g total) by mouth 2 (two) times daily. 12/18/17  Yes Hongalgi, Lenis Dickinson, MD  sildenafil (REVATIO) 20 MG tablet Take 100 mg by mouth daily as needed (erectile dysfunction).    Yes [provider]  trandolapril (MAVIK) 4 MG tablet TAKE 1 TABLET BY MOUTH EVERY DAY Patient taking differently: Take 4 mg by mouth daily.  10/18/18  Yes Lorretta Harp, MD  warfarin (COUMADIN) 10 MG tablet TAKE AS DIRECTED BY COUMADIN CLINIC Patient taking differently: Take 5-10 mg by mouth See admin instructions. Take 1 tablet by mouth everyday Take 1/2 tablet by mouth on fridays 07/14/18  Yes Josue Hector, MD    Physical Exam: Vitals:  02/23/19 1830 02/23/19 1845 02/23/19 1954 02/23/19 2025  BP: (!) 128/37 (!) 149/37 (!) 145/73 (!) 153/34  Pulse: 82 84 91 82  Resp: 12 16 16 16   Temp:   98.5 F (36.9 C) 98.4 F (36.9 C)  TempSrc:   Oral Oral  SpO2: 95% 95% 93% 94%  Weight:      Height:        Constitutional: NAD, calm, comfortable Eyes: PERRL, lids and conjunctivae normal ENMT: Mucous membranes are moist. Posterior pharynx clear of any exudate or lesions.Normal dentition.  Neck: normal, supple, no masses, no thyromegaly Respiratory: clear to auscultation bilaterally, no wheezing, no crackles. Normal respiratory effort. No accessory muscle use.  Cardiovascular: Regular rate and rhythm, no murmurs / rubs / gallops. No extremity edema. 2+ pedal pulses. No carotid bruits.  Abdomen: no tenderness, no masses palpated. No hepatosplenomegaly. Bowel sounds positive.  Musculoskeletal: no clubbing / cyanosis. No joint deformity upper and lower extremities. Good ROM, no contractures. Normal muscle tone.  Skin: no rashes, lesions, ulcers. No induration Neurologic: CN 2-12 grossly intact. Sensation intact, DTR normal. Strength 5/5 in all 4.  Psychiatric: Normal judgment and insight. Alert and oriented x 3. Normal mood.    Labs on Admission: I have personally reviewed following labs and imaging  studies  CBC: Recent Labs  Lab 02/23/19 1801  WBC 10.7*  NEUTROABS 6.1  HGB 8.0*  HCT 24.4*  MCV 96.4  PLT 638   Basic Metabolic Panel: Recent Labs  Lab 02/23/19 1801  NA 135  K 4.4  CL 100  CO2 25  GLUCOSE 299*  BUN 42*  CREATININE 1.04  CALCIUM 9.6   GFR: Estimated Creatinine Clearance: 61.2 mL/min (by C-G formula based on SCr of 1.04 mg/dL). Liver Function Tests: Recent Labs  Lab 02/23/19 1801  AST 40  ALT 49*  ALKPHOS 41  BILITOT 0.5  PROT 5.7*  ALBUMIN 3.4*   Recent Labs  Lab 02/23/19 1801  LIPASE 32   No results for input(s): AMMONIA in the last 168 hours. Coagulation Profile: Recent Labs  Lab 02/23/19 1835  INR 7.5*   Cardiac Enzymes: Recent Labs  Lab 02/23/19 1801  TROPONINI <0.03   BNP (last 3 results) No results for input(s): PROBNP in the last 8760 hours. HbA1C: No results for input(s): HGBA1C in the last 72 hours. CBG: No results for input(s): GLUCAP in the last 168 hours. Lipid Profile: No results for input(s): CHOL, HDL, LDLCALC, TRIG, CHOLHDL, LDLDIRECT in the last 72 hours. Thyroid Function Tests: No results for input(s): TSH, T4TOTAL, FREET4, T3FREE, THYROIDAB in the last 72 hours. Anemia Panel: No results for input(s): VITAMINB12, FOLATE, FERRITIN, TIBC, IRON, RETICCTPCT in the last 72 hours. Urine analysis:    Component Value Date/Time   COLORURINE STRAW (A) 12/20/2017 1924   APPEARANCEUR CLEAR 12/20/2017 1924   LABSPEC 1.020 12/20/2017 1924   PHURINE 7.0 12/20/2017 1924   GLUCOSEU >=500 (A) 12/20/2017 1924   HGBUR NEGATIVE 12/20/2017 1924   BILIRUBINUR NEGATIVE 12/20/2017 1924   KETONESUR 5 (A) 12/20/2017 1924   PROTEINUR NEGATIVE 12/20/2017 1924   UROBILINOGEN 0.2 01/15/2012 1415   NITRITE NEGATIVE 12/20/2017 1924   LEUKOCYTESUR NEGATIVE 12/20/2017 1924    Radiological Exams on Admission: Dg Chest Port 1 View  Result Date: 02/23/2019 CLINICAL DATA:  Shortness of breath EXAM: PORTABLE CHEST 1 VIEW COMPARISON:   12/20/2017 FINDINGS: The heart size and mediastinal contours are within normal limits. Both lungs are clear. Remote median sternotomy. The visualized skeletal structures are unremarkable.  IMPRESSION: No active disease. Electronically Signed   By: Ulyses Jarred M.D.   On: 02/23/2019 18:59    EKG: Independently reviewed.  Assessment/Plan Principal Problem:   Gastrointestinal hemorrhage with melena Active Problems:   Diabetes ([place])   Essential hypertension   H/O mechanical aortic valve replacement   Supratherapeutic INR   Acute blood loss anemia   Symptomatic anemia    1. GIB with melena - Complicated by supratheraputic INR and mechanical AVR 1. See also cards consult 1. Current plan is partial reversal of coumadin to bring INR down to 3 2. Vit K 5mg  3. Holding coumadin 4. Repeat INR in AM 5. NPO 6. Protonix bolus and GTT 7. GI eval in AM 2. Symptomatic acute blood loss anemia - 1. H/H Q6H 2. Transfuse 2u PRBC as per cards note 3. Tele monitor 3. DM2 - 1. Hold home PO hypoglycemics 2. Sensitive SSI Q4H 4. HTN - 1. Holding home BP meds for the moment in setting of significant acute GIB 2. Re-evaluate tomorrow AM  DVT prophylaxis: SCDs Code Status: Full Family Communication: No family in room Disposition Plan: Home after admit Consults called: Cards, GI Admission status: Admit to inpatient  Severity of Illness: The appropriate patient status for this patient is INPATIENT. Inpatient status is judged to be reasonable and necessary in order to provide the required intensity of service to ensure the patient's safety. The patient's presenting symptoms, physical exam findings, and initial radiographic and laboratory data in the context of their chronic comorbidities is felt to place them at high risk for further clinical deterioration. Furthermore, it is not anticipated that the patient will be medically stable for discharge from the hospital within 2 midnights of admission. The  following factors support the patient status of inpatient.   Inpatient status for symptomatic acute blood loss anemia due to UGIB, complicated by supratheraputic INR, further complicated by fact that we cannot safely completely reverse this patient who has a mechanical aortic valve.  * I certify that at the point of admission it is my clinical judgment that the patient will require inpatient hospital care spanning beyond 2 midnights from the point of admission due to high intensity of service, high risk for further deterioration and high frequency of surveillance required.*    Mosie Angus M. DO [facility]  How to contact the [facility] Attending or Consulting provider [place] or covering provider during after hours [place], for this patient?  1. Check the care team in [facility] and look for a) attending/consulting [facility] provider listed and b) the [facility] team listed 2. Log into [email or web address]  Amion Physician Scheduling and messaging for groups and whole hospitals  On call and physician scheduling software for group practices, residents, hospitalists and other medical providers for call, clinic, rotation and shift schedules. On[employer] is a hospital-wide system for scheduling doctors and paging doctors on call. EasyPlot is for scientific plotting and data analysis.  [email or web address]  and use [place]'s universal password to access. If you do not have the password, please contact the hospital operator.  3. Locate the [facility] provider you are looking for under [facility] and page to a number that you can be directly reached. 4. If you still have difficulty reaching the provider, please page the [facility] (Director on Call) for the Hospitalists listed on amion for assistance.  02/23/2019, 9:59 PM

## 2019-02-23 NOTE — ED Notes (Signed)
ED TO INPATIENT HANDOFF REPORT  ED Nurse Name and Phone #: [phone] Darci Current Name/Age/Gender [name] [name] 73 y.o. male Room/Bed: 030C/030C  Code Status   Code Status: Full Code  Home/SNF/Other Home Patient oriented to: self, place, time and situation Is this baseline? Yes      Chief Complaint GI Bleed  Triage Note Pt arrives POV with complaints of black colored stool X3 Pt denies iron pills no bright red blood , pt is on coumadin    Allergies No Known Allergies  Level of Care/Admitting Diagnosis ED Disposition    ED Disposition Condition Comment   Admit  Hospital Area: [place] [[id number]]  Level of Care: Progressive [102]  Diagnosis: Gastrointestinal hemorrhage with melena [[id number]]  Admitting Physician: Etta Quill [[id number]]  Attending Physician: Etta Quill [[id number]]  Estimated length of stay: past midnight tomorrow  Certification:: I certify this patient will need inpatient services for at least 2 midnights  PT Class (Do Not Modify): Inpatient [101]  PT Acc Code (Do Not Modify): Private [1]       B Medical/Surgery History Past Medical History:  Diagnosis Date  . Adenomatous colon polyp 09/1997  . Anemia   . Aortic stenosis    s/p st[facility] mechanical AVR - Chronic Coumadin  . Blood transfusion    "related to ITP"  . Coronary artery disease    s/p cabg x 3 11/2003: lima-lad, seq vg to rpda and rpl  . Diverticulitis of colon   . Heart murmur   . Hyperlipidemia   . Hypertension   . ITP (idiopathic thrombocytopenic purpura)   . Peripheral arterial disease ([place])    a. history of aortobifemoral bypass grafting by Dr. Sherren Mocha early b. LE angiography 04/22/2015 patent aortobifem graft, DES to R SFA  . Peripheral vascular disease ([place])    s/p Left external Iliac Artery stenting and subsequent left femoral endarterectomy 02/2011- post op course complicated by wound infxn req I&D 03/2011  . Renal artery stenosis, native, bilateral  ([facility])    a. bilateral renal artery stenosis by recent duplex ultrasound b. L renal artery stent 02/2015, R renal artery patent on angiogram  . Stroke ([place])   . TIA (transient ischemic attack) ~ 2013  . Type II diabetes mellitus ([place])    Past Surgical History:  Procedure Laterality Date  . ABDOMINAL AORTAGRAM N/A 12/16/2011   Procedure: ABDOMINAL Maxcine Ham;  Surgeon: Sherren Mocha, MD;  Location: [facility] CATH LAB;  Service: Cardiovascular;  Laterality: N/A;  . ANGIOPLASTY / STENTING ILIAC     Left external Iliac Artery  . AORTA - BILATERAL FEMORAL ARTERY BYPASS GRAFT  01/18/2012   Procedure: AORTA BIFEMORAL BYPASS GRAFT;  Surgeon: Curt Jews, MD;  Location: [place];  Service: Vascular;  Laterality: N/A;  . AORTIC VALVE REPLACEMENT  ~ 2004  . CARDIAC CATHETERIZATION  11/2003   /pt report 10/01/2016  . CARDIAC VALVE REPLACEMENT  11/2003   aortic  . CATARACT EXTRACTION W/ INTRAOCULAR LENS  IMPLANT, BILATERAL Bilateral   . CORONARY ARTERY BYPASS GRAFT  11/2003   Archie Endo 04/21/2011  . LOWER EXTREMITY ANGIOGRAM N/A 02/21/2015   Procedure: LOWER EXTREMITY ANGIOGRAM;  Surgeon: Lorretta Harp, MD;  Location: [facility] CATH LAB;  Service: Cardiovascular;  Laterality: N/A;  . PERIPHERAL VASCULAR CATHETERIZATION N/A 04/22/2015   Procedure: Lower Extremity Angiography;  Surgeon: Lorretta Harp, MD;  Location: [place] CV LAB;  Service: Cardiovascular;  Laterality: N/A;  . PERIPHERAL VASCULAR CATHETERIZATION N/A 08/24/2016  Procedure: Lower Extremity Angiography;  Surgeon: Lorretta Harp, MD;  Location: [place] CV LAB;  Service: Cardiovascular;  Laterality: N/A;  . PERIPHERAL VASCULAR CATHETERIZATION Right 10/01/2016   Procedure: Peripheral Vascular Intervention - STENT;  Surgeon: Lorretta Harp, MD;  Location: [place] CV LAB;  Service: Cardiovascular;  Laterality: Right;  Prox and MID SFA   . RENAL ANGIOGRAM N/A 02/21/2015   Procedure: RENAL ANGIOGRAM;  Surgeon: Lorretta Harp, MD;  Location: [facility] CATH  LAB;  Service: Cardiovascular;  Laterality: Bilateral; 6 mm x 12 mm long Herculink balloon expandable stent to the left renal artery  . RENAL ARTERY STENT Left 04/22/2015   dr berry  . SPLENECTOMY  02/2003   Archie Endo 04/21/2011  . TONSILLECTOMY  ~ 1952     A IV Location/Drains/Wounds Patient Lines/Drains/Airways Status   Active Line/Drains/Airways    Name:   Placement date:   Placement time:   Site:   Days:   Peripheral IV 02/23/19 Right Antecubital   02/23/19    1801    Antecubital   less than 1   Peripheral IV 02/23/19 Right;Left Antecubital   02/23/19    1846    Antecubital   less than 1          Intake/Output Last 24 hours  Intake/Output Summary (Last 24 hours) at 02/23/2019 2143 Last data filed at 02/23/2019 1914 Gross per 24 hour  Intake 99.32 ml  Output -  Net 99.32 ml    Labs/Imaging Results for orders placed or performed during the hospital encounter of 02/23/19 (from the past 48 hour(s))  Type and screen     Status: None (Preliminary result)   Collection Time: 02/23/19  5:57 PM  Result Value Ref Range   ABO/RH(D) O POS    Antibody Screen NEG    Sample Expiration 02/26/2019    Unit Number [id number]    Blood Component Type RED CELLS,LR    Unit division 00    Status of Unit ISSUED    Donor AG Type NEGATIVE FOR E ANTIGEN    Transfusion Status OK TO TRANSFUSE    Crossmatch Result COMPATIBLE   CBC with Differential/Platelet     Status: Abnormal   Collection Time: 02/23/19  6:01 PM  Result Value Ref Range   WBC 10.7 (H) 4.0 - 10.5 K/uL   RBC 2.53 (L) 4.22 - 5.81 MIL/uL   Hemoglobin 8.0 (L) 13.0 - 17.0 g/dL   HCT 24.4 (L) 39.0 - 52.0 %   MCV 96.4 80.0 - 100.0 fL   MCH 31.6 26.0 - 34.0 pg   MCHC 32.8 30.0 - 36.0 g/dL   RDW 14.0 11.5 - 15.5 %   Platelets 254 150 - 400 K/uL   nRBC 1.9 (H) 0.0 - 0.2 %   Neutrophils Relative % 56 %   Neutro Abs 6.1 1.7 - 7.7 K/uL   Lymphocytes Relative 29 %   Lymphs Abs 3.1 0.7 - 4.0 K/uL   Monocytes Relative 11 %    Monocytes Absolute 1.2 (H) 0.1 - 1.0 K/uL   Eosinophils Relative 1 %   Eosinophils Absolute 0.1 0.0 - 0.5 K/uL   Basophils Relative 1 %   Basophils Absolute 0.1 0.0 - 0.1 K/uL   Immature Granulocytes 2 %   Abs Immature Granulocytes 0.22 (H) 0.00 - 0.07 K/uL    Comment: Performed at [facility], [address], [place], [place] [address]  Comprehensive metabolic panel     Status: Abnormal   Collection  Time: 02/23/19  6:01 PM  Result Value Ref Range   Sodium 135 135 - 145 mmol/L   Potassium 4.4 3.5 - 5.1 mmol/L   Chloride 100 98 - 111 mmol/L   CO2 25 22 - 32 mmol/L   Glucose, Bld 299 (H) 70 - 99 mg/dL   BUN 42 (H) 8 - 23 mg/dL   Creatinine, Ser 1.04 0.61 - 1.24 mg/dL   Calcium 9.6 8.9 - 10.3 mg/dL   Total Protein 5.7 (L) 6.5 - 8.1 g/dL   Albumin 3.4 (L) 3.5 - 5.0 g/dL   AST 40 15 - 41 U/L   ALT 49 (H) 0 - 44 U/L   Alkaline Phosphatase 41 38 - 126 U/L   Total Bilirubin 0.5 0.3 - 1.2 mg/dL   GFR calc non Af Amer >60 >60 mL/min   GFR calc Af Amer >60 >60 mL/min   Anion gap 10 5 - 15    Comment: Performed at [facility], [address], [place], [place] [address]  Lipase, blood     Status: None   Collection Time: 02/23/19  6:01 PM  Result Value Ref Range   Lipase 32 11 - 51 U/L    Comment: Performed at [facility] [address] [place], [place] [address]  Troponin I - ONCE - STAT     Status: None   Collection Time: 02/23/19  6:01 PM  Result Value Ref Range   Troponin I <0.03 <0.03 ng/mL    Comment: Performed at [facility] [address], [place], [place] [address]  POC occult blood, ED     Status: Abnormal   Collection Time: 02/23/19  6:27 PM  Result Value Ref Range   Fecal Occult Bld POSITIVE (A) NEGATIVE  POC occult blood, ED     Status: Abnormal   Collection Time: 02/23/19  6:33 PM  Result Value Ref Range   Fecal Occult Bld POSITIVE (A) NEGATIVE  Protime-INR     Status: Abnormal   Collection Time: 02/23/19  6:35 PM  Result Value Ref Range    Prothrombin Time 62.5 (H) 11.4 - 15.2 seconds   INR 7.5 (HH) 0.8 - 1.2    Comment: REPEATED TO VERIFY CRITICAL RESULT CALLED TO, READ BACK BY AND VERIFIED WITH: E Saleena Tamas RN AT 1927 ON [id number] BY K FORSYTH (NOTE) INR goal varies based on device and disease states. Performed at [facility], [place] [address], [place], [place] [address]   Prepare RBC     Status: None   Collection Time: 02/23/19  7:11 PM  Result Value Ref Range   Order Confirmation      ORDER PROCESSED BY BLOOD BANK Performed at [facility], [place] [address], [place], [place] [address]    Dg Chest Port 1 View  Result Date: 02/23/2019 CLINICAL DATA:  Shortness of breath EXAM: PORTABLE CHEST 1 VIEW COMPARISON:  12/20/2017 FINDINGS: The heart size and mediastinal contours are within normal limits. Both lungs are clear. Remote median sternotomy. The visualized skeletal structures are unremarkable. IMPRESSION: No active disease. Electronically Signed   By: Ulyses Jarred M.D.   On: 02/23/2019 18:59    Pending Labs Unresulted Labs (From admission, onward)    Start     Ordered   02/24/19 0500  Protime-INR  Tomorrow morning,   R     02/23/19 2050   02/24/19 4008  Basic metabolic panel  Tomorrow morning,   R  02/23/19 2101   02/24/19 0000  Hemoglobin and hematocrit, blood  Now then every 6 hours,   R     02/23/19 2101   02/23/19 1756  Occult blood card to lab, stool  Once,   STAT     02/23/19 1757          Vitals/Pain Today's Vitals   02/23/19 1845 02/23/19 1954 02/23/19 2016 02/23/19 2025  BP: (!) 149/37 (!) 145/73  (!) 153/34  Pulse: 84 91  82  Resp: 16 16  16   Temp:  98.5 F (36.9 C)  98.4 F (36.9 C)  TempSrc:  Oral  Oral  SpO2: 95% 93%  94%  Weight:      Height:      PainSc:   0-No pain     Isolation Precautions No active isolations  Medications Medications  pantoprazole (PROTONIX) 80 mg in sodium chloride 0.9 % 250 mL (0.32 mg/mL) infusion (8 mg/hr Intravenous New Bag/Given 02/23/19  1858)  pantoprazole (PROTONIX) injection 40 mg (has no administration in time range)  acetaminophen (TYLENOL) tablet 650 mg (has no administration in time range)    Or  acetaminophen (TYLENOL) suppository 650 mg (has no administration in time range)  ondansetron (ZOFRAN) tablet 4 mg (has no administration in time range)    Or  ondansetron (ZOFRAN) injection 4 mg (has no administration in time range)  insulin aspart (novoLOG) injection 0-9 Units (has no administration in time range)  atorvastatin (LIPITOR) tablet 80 mg (has no administration in time range)  omega-3 acid ethyl esters (LOVAZA) capsule 1 g (has no administration in time range)  pantoprazole (PROTONIX) 80 mg in sodium chloride 0.9 % 100 mL IVPB (0 mg Intravenous Stopped 02/23/19 1914)  0.9 %  sodium chloride infusion (10 mL/hr Intravenous New Bag/Given 02/23/19 1923)  phytonadione (VITAMIN K) tablet 5 mg (5 mg Oral Given 02/23/19 2037)    Mobility walks Low fall risk   Focused Assessments GI   R Recommendations: See Admitting Provider Note  Report given to:   Additional Notes:

## 2019-02-23 NOTE — Consult Note (Signed)
Cardiology Consultation:   Patient ID: [name] [name]; [id number]; 06/19/1946   Admit date: 02/23/2019 Date of Consult: 02/23/2019  Primary Care Provider: Shon Baton, MD Primary Cardiologist: Jenkins Rouge, MD 08/10/2018 Primary Electrophysiologist:  None   Patient Profile:   [name] [name] is a 73 y.o. male with a hx of AS>>STJ mech AVR (2004) on coumadin, DM, HTN, HLD, ITP, PAD s/p aobifem, L iliac stent, who is being seen today for the evaluation of anticoag in setting of GIB at the request of Dr Lita Mains.  History of Present Illness:   [name] the patient of Dr. Johnsie Cancel, last seen in September 2019, for PVD and CAD status post CABG with AVR in 2004 with a mechanical valve on chronic therapy with Coumadin.  Patient was stable on his last visit with no symptoms, and he was doing really well till 3 days ago when he did develop melanotic stools without any diarrhea epigastric or abdominal pain.  He also states that he has not had any chest pain or shortness of breath, he has been having good appetite and the only difference he noticed was lightheadedness in the last 2 days. He presented to the ER and was found to have positive FOBT x2, hemoglobin 8, his baseline is 15-16 so this is 50% drop, he was also found to have INR of 7.5.  Last check a week ago when it was 2.7.  He denies any unusual diet, no new medications, no fever chills nausea or vomiting.  Past Medical History:  Diagnosis Date  . Adenomatous colon polyp 09/1997  . Anemia   . Aortic stenosis    s/p st[facility] jud[facility] mechanical AVR - Chronic Coumadin  . Blood transfusion    "related to ITP"  . Coronary artery disease    s/p cabg x 3 11/2003: lima-lad, seq vg to rpda and rpl  . Diverticulitis of colon   . Heart murmur   . Hyperlipidemia   . Hypertension   . ITP (idiopathic thrombocytopenic purpura)   . Peripheral arterial disease ([place])    a. history of aortobifemoral bypass grafting by Dr. Sherren Mocha early b. LE  angiography 04/22/2015 patent aortobifem graft, DES to R SFA  . Peripheral vascular disease ([place])    s/p Left external Iliac Artery stenting and subsequent left femoral endarterectomy 02/2011- post op course complicated by wound infxn req I&D 03/2011  . Renal artery stenosis, native, bilateral (H[facility])    a. bilateral renal artery stenosis by recent duplex ultrasound b. L renal artery stent 02/2015, R renal artery patent on angiogram  . Stroke ([place])   . TIA (transient ischemic attack) ~ 2013  . Type II diabetes mellitus ([place])     Past Surgical History:  Procedure Laterality Date  . ABDOMINAL AORTAGRAM N/A 12/16/2011   Procedure: ABDOMINAL Maxcine Ham;  Surgeon: Sherren Mocha, MD;  Location[facility] CATH LAB;  Service: Cardiovascular;  Laterality: N/A;  . ANGIOPLASTY / STENTING ILIAC     Left external Iliac Artery  . AORTA - BILATERAL FEMORAL ARTERY BYPASS GRAFT  01/18/2012   Procedure: AORTA BIFEMORAL BYPASS GRAFT;  Surgeon: Curt Jews, MD;  Location: [place];  Service: Vascular;  Laterality: N/A;  . AORTIC VALVE REPLACEMENT  ~ 2004  . CARDIAC CATHETERIZATION  11/2003   /pt report 10/01/2016  . CARDIAC VALVE REPLACEMENT  11/2003   aortic  . CATARACT EXTRACTION W/ INTRAOCULAR LENS  IMPLANT, BILATERAL Bilateral   . CORONARY ARTERY BYPASS GRAFT  11/2003   Archie Endo 04/21/2011  . LOWER  EXTREMITY ANGIOGRAM N/A 02/21/2015   Procedure: LOWER EXTREMITY ANGIOGRAM;  Surgeon: Lorretta Harp, MD;  Location: [facility] CATH LAB;  Service: Cardiovascular;  Laterality: N/A;  . PERIPHERAL VASCULAR CATHETERIZATION N/A 04/22/2015   Procedure: Lower Extremity Angiography;  Surgeon: Lorretta Harp, MD;  Location: [place] CV LAB;  Service: Cardiovascular;  Laterality: N/A;  . PERIPHERAL VASCULAR CATHETERIZATION N/A 08/24/2016   Procedure: Lower Extremity Angiography;  Surgeon: Lorretta Harp, MD;  Location: [place] CV LAB;  Service: Cardiovascular;  Laterality: N/A;  . PERIPHERAL VASCULAR CATHETERIZATION Right 10/01/2016    Procedure: Peripheral Vascular Intervention - STENT;  Surgeon: Lorretta Harp, MD;  Location: [place] CV LAB;  Service: Cardiovascular;  Laterality: Right;  Prox and MID SFA   . RENAL ANGIOGRAM N/A 02/21/2015   Procedure: RENAL ANGIOGRAM;  Surgeon: Lorretta Harp, MD;  Location: [facility] CATH LAB;  Service: Cardiovascular;  Laterality: Bilateral; 6 mm x 12 mm long Herculink balloon expandable stent to the left renal artery  . RENAL ARTERY STENT Left 04/22/2015   dr berry  . SPLENECTOMY  02/2003   Archie Endo 04/21/2011  . TONSILLECTOMY  ~ 1952     Prior to Admission medications   Medication Sig Start Date End Date Taking? Authorizing Provider  amLODipine (NORVASC) 10 MG tablet Take 10 mg by mouth daily. 02/11/15  Yes [provider]  aspirin EC 81 MG tablet Take 81 mg by mouth daily.   Yes [provider]  atorvastatin (LIPITOR) 80 MG tablet Take 80 mg by mouth daily.    Yes [provider]  canagliflozin (INVOKANA) 100 MG TABS tablet Take 100 mg by mouth daily.   Yes [provider]  Evolocumab (REPATHA SURECLICK) 297 MG/ML SOAJ Inject 140 mg into the skin every 14 (fourteen) days.   Yes [provider]  glyBURIDE (DIABETA) 2.5 MG tablet Take 5 mg by mouth 2 (two) times daily with a meal.    Yes [provider]  hydrochlorothiazide (MICROZIDE) 12.5 MG capsule TAKE 1 CAPSULE BY MOUTH EVERY DAY Patient taking differently: Take 12.5 mg by mouth daily.  02/02/19  Yes Josue Hector, MD  metoprolol succinate (TOPROL-XL) 50 MG 24 hr tablet TAKE 1 TABLET (50 MG TOTAL) BY MOUTH DAILY. TAKE WITH OR IMMEDIATELY FOLLOWING A MEAL. 01/20/19  Yes Lorretta Harp, MD  omega-3 acid ethyl esters (LOVAZA) 1 g capsule Take 1 capsule (1 g total) by mouth 2 (two) times daily. 12/18/17  Yes Hongalgi, Lenis Dickinson, MD  sildenafil (REVATIO) 20 MG tablet Take 100 mg by mouth daily as needed (erectile dysfunction).    Yes [provider]  trandolapril (MAVIK) 4 MG  tablet TAKE 1 TABLET BY MOUTH EVERY DAY Patient taking differently: Take 4 mg by mouth daily.  10/18/18  Yes Lorretta Harp, MD  warfarin (COUMADIN) 10 MG tablet TAKE AS DIRECTED BY COUMADIN CLINIC Patient taking differently: Take 5-10 mg by mouth See admin instructions. Take 1 tablet by mouth everyday Take 1/2 tablet by mouth on fridays 07/14/18  Yes Josue Hector, MD  warfarin (COUMADIN) 10 MG tablet TAKE AS DIRECTED BY COUMADIN CLINIC Patient not taking: Reported on 02/23/2019 02/13/19   Josue Hector, MD    Inpatient Medications: Scheduled Meds: . [START ON 02/27/2019] pantoprazole  40 mg Intravenous Q12H   Continuous Infusions: . sodium chloride    . pantoprazole (PROTONIX) IVPB 80 mg (02/23/19 1854)  . pantoprozole (PROTONIX) infusion 8 mg/hr (02/23/19 1858)   PRN Meds:  Allergies:   No Known Allergies  Social History:   Social History   Socioeconomic History  . Marital status: Divorced    Spouse name: Not on file  . Number of children: 3  . Years of education: Not on file  . Highest education level: Not on file  Occupational History  . Occupation: Retired  Scientific laboratory technician  . Financial resource strain: Not on file  . Food insecurity:    Worry: Not on file    Inability: Not on file  . Transportation needs:    Medical: Not on file    Non-medical: Not on file  Tobacco Use  . Smoking status: Former Smoker    Years: 30.00    Types: Cigarettes, Cigars    Last attempt to quit: 12/15/1993    Years since quitting: 25.2  . Smokeless tobacco: Never Used  Substance and Sexual Activity  . Alcohol use: Yes    Alcohol/week: 16.0 standard drinks    Types: 1 Cans of beer, 1 Shots of liquor, 14 Standard drinks or equivalent per week    Comment: drinks 2 martini's a night (2 shots in each)  . Drug use: No  . Sexual activity: Yes  Lifestyle  . Physical activity:    Days per week: Not on file    Minutes per session: Not on file  . Stress: Not on file  Relationships  .  Social connections:    Talks on phone: Not on file    Gets together: Not on file    Attends religious service: Not on file    Active member of club or organization: Not on file    Attends meetings of clubs or organizations: Not on file    Relationship status: Not on file  . Intimate partner violence:    Fear of current or ex partner: Not on file    Emotionally abused: Not on file    Physically abused: Not on file    Forced sexual activity: Not on file  Other Topics Concern  . Not on file  Social History Narrative   Tries to remain active.  Frequent golfer but claudication limits this.    Family History:   Family History  Problem Relation Age of Onset  . Coronary artery disease Mother        bypass surgery - deceased  . Heart disease Father        murmur, valve replacement - deceased  . Breast cancer Sister   . Diabetes Other        grandmother  . Diabetes Paternal Grandmother   . Diabetes Paternal Aunt   . Colon cancer Neg Hx    Family Status:  Family Status  Relation Name Status  . Mother  Deceased  . Father  Deceased  . Sister  (Not Specified)  . Other  (Not Specified)  . PGM  Deceased  . Ethlyn Daniels  Deceased  . Neg Hx  (Not Specified)    ROS:  Please see the history of present illness.  All other ROS reviewed and negative.     Physical Exam/Data:   Vitals:   02/23/19 1816 02/23/19 1824 02/23/19 1830 02/23/19 1845  BP:   (!) 128/37 (!) 149/37  Pulse:   82 84  Resp:   12 16  Temp:  98.5 F (36.9 C)    TempSrc:  Oral    SpO2:   95% 95%  Weight: 77.1 kg     Height: 5\' 8"  (1.727 m)  No intake or output data in the 24 hours ending 02/23/19 1910 Filed Weights   02/23/19 1816  Weight: 77.1 kg   Body mass index is 25.85 kg/m.  General:  Well nourished, well developed, in no acute distress HEENT: normal Lymph: no adenopathy Neck: no JVD Endocrine:  No thryomegaly Vascular: No carotid bruits; 4/4 extremity pulses 2+, without bruits  Cardiac:  normal  S1, S2 loud clicks; RRR; no murmur  Lungs:  clear to auscultation bilaterally, no wheezing, rhonchi or rales  Abd: soft, nontender, no hepatomegaly  Ext: no edema Musculoskeletal:  No deformities, BUE and BLE strength normal and equal Skin: warm and dry  Neuro:  CNs 2-12 intact, no focal abnormalities noted Psych:  Normal affect   EKG:  The EKG was personally reviewed and demonstrates: Sinus rhythm with diffuse negative T waves in the anterolateral and inferior leads these are new when compared to the prior EKG in January 2019 Telemetry:  Telemetry was personally reviewed and demonstrates: Sinus rhythm  Relevant CV Studies:  ECHO: December 14, 2017  - Left ventricle: The cavity size was normal. Wall thickness was   increased in a pattern of moderate LVH. Systolic function was   normal. The estimated ejection fraction was in the range of 60%   to 65%. Left ventricular diastolic function parameters were   normal. - Aortic valve: Normal appearing mechanical AVR with no peri   valvular regurgitation Valve area (VTI): 1.45 cm^2. Valve area   (Vmax): 1.11 cm^2. Valve area (Vmean): 1.32 cm^2. - Mitral valve: Calcified annulus. Mildly thickened leaflets .   Valve area by pressure half-time: 1.22 cm^2. - Atrial septum: No defect or patent foramen ovale was identified.  Laboratory Data:  Chemistry Recent Labs  Lab 02/23/19 1801  NA 135  K 4.4  CL 100  CO2 25  GLUCOSE 299*  BUN 42*  CREATININE 1.04  CALCIUM 9.6  GFRNONAA >60  GFRAA >60  ANIONGAP 10    Lab Results  Component Value Date   ALT 49 (H) 02/23/2019   AST 40 02/23/2019   ALKPHOS 41 02/23/2019   BILITOT 0.5 02/23/2019   Hematology Recent Labs  Lab 02/23/19 1801  WBC 10.7*  RBC 2.53*  HGB 8.0*  HCT 24.4*  MCV 96.4  MCH 31.6  MCHC 32.8  RDW 14.0  PLT 254   Cardiac Enzymes Recent Labs  Lab 02/23/19 1801  TROPONINI <0.03   No results for input(s): TROPIPOC in the last 168 hours.  BNPNo results for  input(s): BNP, PROBNP in the last 168 hours.  DDimer No results for input(s): DDIMER in the last 168 hours. TSH:  Lab Results  Component Value Date   TSH 2.178 12/13/2017   Lipids: Lab Results  Component Value Date   CHOL 178 12/21/2017   HDL 54 12/21/2017   LDLCALC 100 (H) 12/21/2017   TRIG 121 12/21/2017   CHOLHDL 3.3 12/21/2017   HgbA1c: Lab Results  Component Value Date   HGBA1C 6.7 (H) 12/21/2017   Magnesium:  Magnesium  Date Value Ref Range Status  01/19/2012 2.1 1.5 - 2.5 mg/dL Final   Lab Results  Component Value Date   INR 2.7 02/14/2019   INR 2.3 01/09/2019   INR 2.0 12/12/2018   PROTIME 16.1 05/22/2009   PROTIME 23.6 05/08/2009   Radiology/Studies:  Dg Chest Port 1 View  Result Date: 02/23/2019 CLINICAL DATA:  Shortness of breath EXAM: PORTABLE CHEST 1 VIEW COMPARISON:  12/20/2017 FINDINGS: The heart size and mediastinal contours  are within normal limits. Both lungs are clear. Remote median sternotomy. The visualized skeletal structures are unremarkable. IMPRESSION: No active disease. Electronically Signed   By: Ulyses Jarred M.D.   On: 02/23/2019 18:59    Assessment and Plan:   Active Problems:   * No active hospital problems. *  Acute GI bleed, melena Anemia CAD status post CABG with new EKG changes Supratherapeutic INR Status post AVR with a mechanical valve   For questions or updates, please contact [place] Please consult [email or web address] for contact info under Cardiology/STEMI.   Signed, Rosaria Ferries, PA-C  02/23/2019 7:10 PM   The patient was seen, examined and discussed with Rosaria Ferries, PA-C and I agree with the above.   The patient was examined and has no sign of angina or CHF, he feels comfortable right now, I assume that his anemia will further worsen once he receives IV fluids to correct acute volume loss.  I would strongly support transfusion of 2 units of blood given that he lost 50% of his blood with baseline hemoglobin  being 15-16 and currently 8. He has history of CAD status post CABG, he now has diffuse negative T waves in inferior and anterolateral leads, this is consistent with demand ischemia and support decision to transfuse 2 units of blood.  INR 7.5.  I would start with vitamin K 5 mg p.o. now, reevaluate in the morning and give more if INR still above 3.  Hold Coumadin.  Patient will need to be followed closely for any signs of mechanical valve thrombosis.  Ena Dawley, MD 02/23/2019

## 2019-02-23 NOTE — H&P (Signed)
Pt on coumadin for AVR. Last INR 2.7 per pt. Known CAD and PAD H/O ITP Called c [place] stools and brought in to office today and being sent to [facility] ED for admission  . Hbg down from 16.3 on 02/01/2019 to 8.3 today Sent to [facility] ER for eval and Rx including admission. He is non toxic. He is aware of need for GI consult c EGD plus or Minus colon as directed.  Colonoscopy  11/2008  H/O Diverticosis/Colon polyps/Internal hemorrhoids 09/2016 - Positive-adenomatous polyps - Recall by 2022 if needed - A/O 'They dont want to do another' per pt.   Dr Fuller Plan  PMHx - CVA 04/2013, 12/2017 - Only Minor R hand residual.   ITP S/P Rx @  2004 S/P Splenectomy.   DM2.   Aortic stenosis S/P AVR.   HTN,  Hypercholesterolemia, PAD/Claudication S/P B PCI and fem-pop bypass,  RAS, Subclavian Stenosis, H/O Diverticulitis,   Colon polyps,   Internal hemorrhoids,  TG,   Allergic rhinitis,   Actinic keratosis,    UGIB,   CAD/CABG.  GERD.  Rosacea.  Insomnia.  OA. rt 3rd finger lac with 4 sutures 2018 hospital 12/20/17 for subacute small left frontal lobe MCA territory infarct due to subtherapeutic INR RAS - S/P L Renal aa Stent/PCI 04/2015  1)  Pantoprazole Sodium 40 Mg Oral Tablet Delayed Release (Pantoprazole sodium) .... Take one tablet daily 2)  Repatha Sureclick 188 Mg/ml Subcutaneous Solution Auto-injector (Evolocumab) .... Inject 140mg  every 2 weeks 3)  Aspir-low 81 Mg Oral Tablet Delayed Release (Aspirin) .... Take 1 tablet po q daily 4)  Fish Oil Capsule (Omega-3 fatty acids caps) .... Bid 5)  Invokana 100 Mg Tablet (Canagliflozin) .... Take 1 tablet by mouth once daily 6)  Onetouch Delica Lancets 41Y (Lancets) .... Use 1 lancet qd to test cbg dx:  e11.51 7)  Onetouch Verio in Vitro Strip (Glucose blood) .... Use 1 strip qd to check cbg dx:  e11.51 8)  Glyburide 2.5 Mg Tablet (Glyburide) .... Take 2 tablets by mouth twice a day 9)  Coumadin 10 Mg Oral Tablet (Warfarin sodium) .... Take one tablet daily (10mg ),  except on mondays and fridays take 1/2 (half) a tablet (5mg ) daily 10)  Atorvastatin 80 Mg Tablet (Atorvastatin calcium) .... Take 1 tablet by mouth once daily 11)  Mavik 4 Mg Oral Tablet (Trandolapril) .[name].. 1 tabs po qd 12)  Toprol Xl 50 Mg Oral Tablet Extended Release 24 Hour (Metoprolol succinate) .[name].. 1 tab po qd 13)  Hydrochlorothiazide 12.5 Mg Oral Tablet (Hydrochlorothiazide) .... One daily 14)  Amlodipine Besylate 10 Mg Tab (Amlodipine besylate) .... Take 1 tablet by mouth once daily 15)  Sildenafil Citrate 20 Mg Oral Tablet (Sildenafil citrate) .... One - five po prn sex

## 2019-02-24 DIAGNOSIS — K921 Melena: Secondary | ICD-10-CM

## 2019-02-24 DIAGNOSIS — I10 Essential (primary) hypertension: Secondary | ICD-10-CM

## 2019-02-24 DIAGNOSIS — D649 Anemia, unspecified: Secondary | ICD-10-CM

## 2019-02-24 LAB — GLUCOSE, CAPILLARY
Glucose-Capillary: 146 mg/dL — ABNORMAL HIGH (ref 70–99)
Glucose-Capillary: 185 mg/dL — ABNORMAL HIGH (ref 70–99)
Glucose-Capillary: 360 mg/dL — ABNORMAL HIGH (ref 70–99)
Glucose-Capillary: 127 mg/dL — ABNORMAL HIGH (ref 70–99)
Glucose-Capillary: 157 mg/dL — ABNORMAL HIGH (ref 70–99)

## 2019-02-24 LAB — HEMOGLOBIN AND HEMATOCRIT, BLOOD
HCT: 23.3 % — ABNORMAL LOW (ref 39.0–52.0)
HCT: 23.6 % — ABNORMAL LOW (ref 39.0–52.0)
HCT: 24.8 % — ABNORMAL LOW (ref 39.0–52.0)
HEMOGLOBIN: 7.8 g/dL — AB (ref 13.0–17.0)
HEMOGLOBIN: 8.1 g/dL — AB (ref 13.0–17.0)
Hemoglobin: 8 g/dL — ABNORMAL LOW (ref 13.0–17.0)

## 2019-02-24 LAB — BPAM RBC
Blood Product Expiration Date: 202003192359
ISSUE DATE / TIME: 202003191952
Unit Type and Rh: 9500

## 2019-02-24 LAB — TYPE AND SCREEN
ABO/RH(D): O POS
Antibody Screen: NEGATIVE
Donor AG Type: NEGATIVE
Unit division: 0

## 2019-02-24 LAB — MRSA PCR SCREENING: MRSA by PCR: NEGATIVE

## 2019-02-24 LAB — BASIC METABOLIC PANEL
Anion gap: 7 (ref 5–15)
BUN: 30 mg/dL — ABNORMAL HIGH (ref 8–23)
CO2: 27 mmol/L (ref 22–32)
Calcium: 8.6 mg/dL — ABNORMAL LOW (ref 8.9–10.3)
Chloride: 105 mmol/L (ref 98–111)
Creatinine, Ser: 0.9 mg/dL (ref 0.61–1.24)
GFR calc Af Amer: >60 mL/min (ref >60)
GFR calc non Af Amer: >60 mL/min (ref >60)
GLUCOSE: 159 mg/dL — AB (ref 70–99)
Potassium: 3.8 mmol/L (ref 3.5–5.1)
Sodium: 139 mmol/L (ref 135–145)

## 2019-02-24 LAB — PROTIME-INR
INR: 6.4 (ref 0.8–1.2)
INR: 6.4 (ref 0.8–1.2)
PROTHROMBIN TIME: 55.4 s — AB (ref 11.4–15.2)
Prothrombin Time: 55.4 seconds — ABNORMAL HIGH (ref 11.4–15.2)

## 2019-02-24 NOTE — Progress Notes (Addendum)
Progress Note  Patient Name: [name] [name] Date of Encounter: 02/24/2019  Primary Cardiologist: Jenkins Rouge, MD  Subjective   No symptoms at rest. No CP, SOB, lightheadedness.  Inpatient Medications    Scheduled Meds: . atorvastatin  80 mg Oral Daily  . insulin aspart  0-9 Units Subcutaneous Q4H  . omega-3 acid ethyl esters  1 g Oral BID  . [START ON 02/27/2019] pantoprazole  40 mg Intravenous Q12H   Continuous Infusions: . pantoprozole (PROTONIX) infusion 8 mg/hr (02/24/19 0544)   PRN Meds: acetaminophen **OR** acetaminophen, ondansetron **OR** ondansetron (ZOFRAN) IV   Vital Signs    Vitals:   02/23/19 2115 02/24/19 0030 02/24/19 0407 02/24/19 0805  BP: (!) 132/41 (!) 103/44 (!) 113/42 (!) 152/67  Pulse: 79  81   Resp: 20 18 16 19   Temp:  98.2 F (36.8 C) 98.8 F (37.1 C) 99 F (37.2 C)  TempSrc:  Oral Oral Oral  SpO2: 95% 99% 94% 93%  Weight:   76.2 kg   Height:   5\' 8"  (1.727 m)     Intake/Output Summary (Last 24 hours) at 02/24/2019 0845 Last data filed at 02/24/2019 0402 Gross per 24 hour  Intake 969.57 ml  Output 600 ml  Net 369.57 ml   Last 3 Weights 02/24/2019 02/23/2019 08/22/2018  Weight (lbs) 167 lb 15.9 oz 170 lb 175 lb 9.6 oz  Weight (kg) 76.2 kg 77.111 kg 79.652 kg     Telemetry    NSR  Personally Reviewed  Physical Exam   GEN: No acute distress.  HEENT: Normocephalic, atraumatic, sclera non-icteric. Neck: No JVD or bruits. Cardiac: RRR no murmurs, rubs, or gallops.  Radials/DP/PT 1+ and equal bilaterally.  Respiratory: Clear to auscultation bilaterally. Breathing is unlabored. GI: Soft, nontender, non-distended, BS +x 4. MS: no deformity. Extremities: No clubbing or cyanosis. No edema. Distal pedal pulses are 2+ and equal bilaterally. Neuro:  AAOx3. Follows commands. Psych:  Responds to questions appropriately with a normal affect.  Labs    Chemistry Recent Labs  Lab 02/23/19 1801 02/24/19 0601  NA 135 139  K 4.4  3.8  CL 100 105  CO2 25 27  GLUCOSE 299* 159*  BUN 42* 30*  CREATININE 1.04 0.90  CALCIUM 9.6 8.6*  PROT 5.7*  --   ALBUMIN 3.4*  --   AST 40  --   ALT 49*  --   ALKPHOS 41  --   BILITOT 0.5  --   GFRNONAA >60 >60  GFRAA >60 >60  ANIONGAP 10 7     Hematology Recent Labs  Lab 02/23/19 1801 02/24/19 0037 02/24/19 0601  WBC 10.7*  --   --   RBC 2.53*  --   --   HGB 8.0* 8.0* 7.8*  HCT 24.4* 23.3* 23.6*  MCV 96.4  --   --   MCH 31.6  --   --   MCHC 32.8  --   --   RDW 14.0  --   --   PLT 254  --   --     Cardiac Enzymes Recent Labs  Lab 02/23/19 1801  TROPONINI <0.03   No results for input(s): TROPIPOC in the last 168 hours.   BNPNo results for input(s): BNP, PROBNP in the last 168 hours.   DDimer No results for input(s): DDIMER in the last 168 hours.   Radiology    Dg Chest Port 1 View  Result Date: 02/23/2019 CLINICAL DATA:  Shortness of breath EXAM: PORTABLE CHEST  1 VIEW COMPARISON:  12/20/2017 FINDINGS: The heart size and mediastinal contours are within normal limits. Both lungs are clear. Remote median sternotomy. The visualized skeletal structures are unremarkable. IMPRESSION: No active disease. Electronically Signed   By: Ulyses Jarred M.D.   On: 02/23/2019 18:59    Cardiac Studies   2D echo 12/14/17 Study Conclusions  - Left ventricle: The cavity size was normal. Wall thickness was   increased in a pattern of moderate LVH. Systolic function was   normal. The estimated ejection fraction was in the range of 60%   to 65%. Left ventricular diastolic function parameters were   normal. - Aortic valve: Normal appearing mechanical AVR with no peri   valvular regurgitation Valve area (VTI): 1.45 cm^2. Valve area   (Vmax): 1.11 cm^2. Valve area (Vmean): 1.32 cm^2. - Mitral valve: Calcified annulus. Mildly thickened leaflets .   Valve area by pressure half-time: 1.22 cm^2. - Atrial septum: No defect or patent foramen ovale was identified.  Patient Profile      73 y.o. male with AS s/p STJ mech AVR (2004) on coumadin, CAD s/p CABG in 2004 at time of AVR, DM, HTN, HLD, ITP, PAD s/p aobifem, L iliac stent who presented to [facility] with lightheadedness and melanotic stools with probable acute GIB and ABL anemia.  Assessment & Plan    1. Acute GIB - in setting of supratherapeutic INR, which occurred for unclear reasons. Pt denies any diet changes or new meds, last INR before this was 2.7. INR was 7.5 on arrival, received 5mg  vit K -> 6.4. appears IM plans to repeat this AM prior to deciding on additional Vit K. Will review with Md. Given 1 u PRBC conservatively given blood shortage. Hgb relatively stable the last 6 hours. Pt indicates plan for GI to consult today.  2. CAD - continue statin. Consider resumption of lower dose BB so as not to precipitate BB withdrawal.  3. HTN - antihypertensives are on hold given acute anemia.  For questions or updates, please contact [place] Please consult [email or web address] for contact info under Cardiology/STEMI.  Signed, Charlie Pitter, PA-C 02/24/2019, 8:45 AM    I have seen and examined the patient along with Charlie Pitter, PA-C.  I have reviewed the chart, notes and new data.  I agree with PA's note.  Key new complaints: no further overt GI bleeding Key examination changes: crisp prosthetic valve clicks, no diastolic murmurs Key new findings / data: stable HGb approx 7.8.  PLAN: Possible EGD tomorrow, INR allowing. Hemodynamically stable, no evidence of prosthetic valve dysfunction.   Sanda Klein, MD, [place] [phone] 02/24/2019, 2:54 PM

## 2019-02-24 NOTE — Progress Notes (Addendum)
Lab called with a critical INR value of 6.4. Patient assessed. No distress noted.  On call notified.

## 2019-02-24 NOTE — Progress Notes (Signed)
PROGRESS NOTE        PATIENT DETAILS Name: [name] [name] Age: 73 y.o. Sex: male Date of Birth: [birth year] Admit Date: 02/23/2019 Admitting Physician Etta Quill, DO YJE:HUDJS, Jenny Reichmann, MD  Brief Narrative: Patient is a 73 y.o. male with history of mechanical aortic valve replacement in 2004, CAD status post CABG in 2004, PAD-presented with upper GI bleeding with acute blood loss anemia in the setting of supratherapeutic INR.  See below for further details.  Subjective: No melanotic stools since day before yesterday.  No chest pain or shortness of breath.  Assessment/Plan: Upper GI bleeding with acute blood loss anemia: Thankfully no melena for more than 36 hours now-continue PPI infusion.  Patient is s/p 1 unit of PRBC-hemoglobin relatively stable at 7.8-without any ongoing melena-suspect we can watch for now.  Continue PPI infusion-GI following for potential endoscopic evaluation when INR is more acceptable.  Supratherapeutic INR: INR has slowly down trended to 6.4 this morning-received vitamin K last night.  Due to clinical stability-and no melena for more than 36 hours-suspect we can let the INR drift down-without further use of vitamin K.  Obviously this patient starts having melena again-or becomes unstable-we can give FFP's/vitamin K.  Per patient, he has had 2 small strokes in the past due to subtherapeutic INR.  DM-2: CBG stable-continue SSI.  Continue to hold all antihypertensive medications.  Hypertension: Blood pressure stable-all antihypertensives remain on hold  History of mechanical valve replacement secondary to aortic stenosis due to bicuspid aortic valve: Anticoagulation on hold.  History of CAD s/p CABG and PAD: Stable-anticoagulation on hold  DVT Prophylaxis: SCD's  Code Status: Full code   Family Communication: None at bedside  Disposition Plan: Remain inpatient  Antimicrobial agents: Anti-infectives (From admission,  onward)   None      Procedures: None  CONSULTS:  cardiology and GI  Time spent: 25- minutes-Greater than 50% of this time was spent in counseling, explanation of diagnosis, planning of further management, and coordination of care.  MEDICATIONS: Scheduled Meds: . atorvastatin  80 mg Oral Daily  . insulin aspart  0-9 Units Subcutaneous Q4H  . omega-3 acid ethyl esters  1 g Oral BID  . [START ON 02/27/2019] pantoprazole  40 mg Intravenous Q12H   Continuous Infusions: . pantoprozole (PROTONIX) infusion 8 mg/hr (02/24/19 0544)   PRN Meds:.acetaminophen **OR** acetaminophen, ondansetron **OR** ondansetron (ZOFRAN) IV   PHYSICAL EXAM: Vital signs: Vitals:   02/23/19 2115 02/24/19 0030 02/24/19 0407 02/24/19 0805  BP: (!) 132/41 (!) 103/44 (!) 113/42 (!) 152/67  Pulse: 79  81   Resp: 20 18 16 19   Temp:  98.2 F (36.8 C) 98.8 F (37.1 C) 99 F (37.2 C)  TempSrc:  Oral Oral Oral  SpO2: 95% 99% 94% 93%  Weight:   76.2 kg   Height:   5\' 8"  (1.727 m)    Filed Weights   02/23/19 1816 02/24/19 0407  Weight: 77.1 kg 76.2 kg   Body mass index is 25.54 kg/m.   General appearance :Awake, alert, not in any distress. Speech Clear. Not toxic Looking Eyes:, pupils equally reactive to light and accomodation,no scleral icterus.Pink conjunctiva HEENT: Atraumatic and Normocephalic Neck: supple, no JVD. No cervical lymphadenopathy. No thyromegaly Resp:Good air entry bilaterally, no added sounds  CVS: S1 S2 regular,+metallic click GI: Bowel sounds present, Non tender and not distended with  no gaurding, rigidity or rebound.No organomegaly Extremities: B/L Lower Ext shows no edema, both legs are warm to touch Neurology:  speech clear,Non focal, sensation is grossly intact. Musculoskeletal:No digital cyanosis Skin:No Rash, warm and dry Wounds:N/A  I have personally reviewed following labs and imaging studies  LABORATORY DATA: CBC: Recent Labs  Lab 02/23/19 1801 02/24/19 0037  02/24/19 0601  WBC 10.7*  --   --   NEUTROABS 6.1  --   --   HGB 8.0* 8.0* 7.8*  HCT 24.4* 23.3* 23.6*  MCV 96.4  --   --   PLT 254  --   --     Basic Metabolic Panel: Recent Labs  Lab 02/23/19 1801 02/24/19 0601  NA 135 139  K 4.4 3.8  CL 100 105  CO2 25 27  GLUCOSE 299* 159*  BUN 42* 30*  CREATININE 1.04 0.90  CALCIUM 9.6 8.6*    GFR: Estimated Creatinine Clearance: 70.7 mL/min (by C-G formula based on SCr of 0.9 mg/dL).  Liver Function Tests: Recent Labs  Lab 02/23/19 1801  AST 40  ALT 49*  ALKPHOS 41  BILITOT 0.5  PROT 5.7*  ALBUMIN 3.4*   Recent Labs  Lab 02/23/19 1801  LIPASE 32   No results for input(s): AMMONIA in the last 168 hours.  Coagulation Profile: Recent Labs  Lab 02/23/19 1835 02/24/19 0601  INR 7.5* 6.4*    Cardiac Enzymes: Recent Labs  Lab 02/23/19 1801  TROPONINI <0.03    BNP (last 3 results) No results for input(s): PROBNP in the last 8760 hours.  HbA1C: No results for input(s): HGBA1C in the last 72 hours.  CBG: Recent Labs  Lab 02/23/19 2252 02/24/19 0415 02/24/19 0753  GLUCAP 173* 146* 185*    Lipid Profile: No results for input(s): CHOL, HDL, LDLCALC, TRIG, CHOLHDL, LDLDIRECT in the last 72 hours.  Thyroid Function Tests: No results for input(s): TSH, T4TOTAL, FREET4, T3FREE, THYROIDAB in the last 72 hours.  Anemia Panel: No results for input(s): VITAMINB12, FOLATE, FERRITIN, TIBC, IRON, RETICCTPCT in the last 72 hours.  Urine analysis:    Component Value Date/Time   COLORURINE STRAW (A) 12/20/2017 1924   APPEARANCEUR CLEAR 12/20/2017 1924   LABSPEC 1.020 12/20/2017 1924   PHURINE 7.0 12/20/2017 1924   GLUCOSEU >=500 (A) 12/20/2017 1924   HGBUR NEGATIVE 12/20/2017 1924   BILIRUBINUR NEGATIVE 12/20/2017 1924   KETONESUR 5 (A) 12/20/2017 1924   PROTEINUR NEGATIVE 12/20/2017 1924   UROBILINOGEN 0.2 01/15/2012 1415   NITRITE NEGATIVE 12/20/2017 1924   LEUKOCYTESUR NEGATIVE 12/20/2017 1924     Sepsis Labs: Lactic Acid, Venous No results found for: LATICACIDVEN  MICROBIOLOGY: Recent Results (from the past 240 hour(s))  MRSA PCR Screening     Status: None   Collection Time: 02/23/19 11:05 PM  Result Value Ref Range Status   MRSA by PCR NEGATIVE NEGATIVE Final    Comment:        The GeneXpert MRSA Assay (FDA approved for NASAL specimens only), is one component of a comprehensive MRSA colonization surveillance program. It is not intended to diagnose MRSA infection nor to guide or monitor treatment for MRSA infections. Performed at [facility], [place] [address], [place], [place] [address]     RADIOLOGY STUDIES/RESULTS: Dg Chest Port 1 View  Result Date: 02/23/2019 CLINICAL DATA:  Shortness of breath EXAM: PORTABLE CHEST 1 VIEW COMPARISON:  12/20/2017 FINDINGS: The heart size and mediastinal contours are within normal limits. Both lungs are clear. Remote median sternotomy.  The visualized skeletal structures are unremarkable. IMPRESSION: No active disease. Electronically Signed   By: Ulyses Jarred M.D.   On: 02/23/2019 18:59     LOS: 1 day   Oren Binet, MD  [facility]  If 7PM-7AM, please contact night-coverage  Please page via [email or web address]  Go to [email or web address] and use [place]'s universal password to access. If you do not have the password, please contact the hospital operator.  Locate the [facility] provider you are looking for under [facility] and page to a number that you can be directly reached. If you still have difficulty reaching the provider, please page the [facility] (Director on Call) for the Hospitalists listed on amion for assistance.  02/24/2019, 10:56 AM

## 2019-02-24 NOTE — Progress Notes (Signed)
Inpatient Diabetes Program Recommendations  AACE/ADA: New Consensus Statement on Inpatient Glycemic Control (2015)  Target Ranges:  Prepandial:   less than 140 mg/dL      Peak postprandial:   less than 180 mg/dL (1-2 hours)      Critically ill patients:  140 - 180 mg/dL   Results for JAHDIEL, KROL (MRN [id number]) as of 02/24/2019 12:28  Ref. Range 02/24/2019 04:15 02/24/2019 07:53 02/24/2019 12:03  Glucose-Capillary Latest Ref Range: 70 - 99 mg/dL 146 (H) 185 (H) 360 (H)   Review of Glycemic Control  Diabetes history: DM 2 Outpatient Diabetes medications: Invokana 100 mg Daily, Glyburide 5 mg BID Current orders for Inpatient glycemic control: Novolog 0-9 units tid  Inpatient Diabetes Program Recommendations:    Glucose increased to 360 at lunchtime. Consider increasing Novolog Correction to 0-15 units tid. May need low dose basal insulin if trends continue to be elevated.  Thanks,  Tama Headings RN, MSN, BC-ADM Inpatient Diabetes Coordinator Team Pager [phone] (8a-5p)

## 2019-02-24 NOTE — Progress Notes (Addendum)
Jana Half from blood bank called to inform that 2nd unit of blood will not be released as per medical Director Dr. Saralyn Pilar. VSS, patient is asymptomatic and patient last HgB was an 8.0. Due to conservative measures implemented to maximize the blood products on hand he is not a candidate for 2nd unit of blood. MD notified.

## 2019-02-24 NOTE — Progress Notes (Signed)
CRITICAL VALUE ALERT  Critical Value:  INR 6.4 Date & Time Notied:  02/24/19  Provider Notified: Ghimire  Orders Received/Actions taken:NONE

## 2019-02-24 NOTE — Consult Note (Signed)
[facility] Consult: 8:13 AM 02/24/2019  LOS: 1 day    Referring Provider: Dr Sloan Leiter  Primary Care Physician:  Shon Baton, MD Primary Gastroenterologist:  Dr. Fuller Plan    Reason for Consultation:  GI bleed, anemia.     HPI: Kinston Magnan is a 73 y.o. male.  Chronic Coumadin, 80 ASA post mechanical AVR.  S/p CABG 2004.  Extensive ASPVD with interventions outlined below.  ITP requiring blood transfusion.  S/p splenectomy. CVA.  TIA.  Htn.    09/2016 EGD.  For evaluation of melena, epigastric pain, diarrhea:  Gastritis.  Normal esophagus, duodenal bulb and D2.  Gastric biopsies showed slight chronic gastritis, no H. pylori.  Duodenal biopsy normal, no evidence of celiac changes. 09/2016 Colonoscopy.  For evaluation of diarrhea.  Four, 5 to 7 mm polyps in the sigmoid, descending and appendiceal orifice resected.  Internal hemorrhoids.  Pan-diverticulosis.  Normal-appearing distal terminal ileum. Random colonic biopsies normal.  Polyp pathology included tubular adenoma, sessile serrated adenoma and benign fragments.    3 days ago his stools turned black and tarry.  He had 1/day through yesterday morning, no bowel movement since.  He was fatigued and felt poorly.  Normally he is either in the gym doing cardiovascular exercise or on the golf course 7 days a week.  Yesterday when he got off the golf cart he took a couple of steps and could not go any further do to weakness and dizziness.  Called his doctor, seen and was sent to the ED.  Patient has not had any nausea, vomiting, abdominal pain, anorexia.  Does not use NSAIDs.  Not taking PPI at home, does not have reflux symptoms.  Has not had any recent additions of medications or antibiotics.  10 days ago his INR was 2.7.  In the ED there was no hypotension or  tachycardia, adequate O2 sats. Stool melenic, FOBT positive on DRE INR 7.5 >> 6.4.  Received Vitamin K 5mg .   Hgb 8 >> 1 U PRBC >> 7.8.  Was 16.3 within the last month,15.7 on 12/21/17.  MCV mid 90s.   BUn 42, creat normal at 1.    Protonix gtt in place.  Not on PPI etc at home.     Past Medical History:  Diagnosis Date  . Adenomatous colon polyp 09/1997  . Anemia   . Aortic stenosis    s/p [facility] mechanical AVR - Chronic Coumadin  . Blood transfusion    "related to ITP"  . Coronary artery disease    s/p cabg x 3 11/2003: lima-lad, seq vg to rpda and rpl  . Diverticulitis of colon   . Heart murmur   . Hyperlipidemia   . Hypertension   . ITP (idiopathic thrombocytopenic purpura)   . Peripheral arterial disease ([place])    a. history of aortobifemoral bypass grafting by Dr. Sherren Mocha early b. LE angiography 04/22/2015 patent aortobifem graft, DES to R SFA  . Peripheral vascular disease ([place])    s/p Left external Iliac Artery stenting and subsequent left femoral endarterectomy 02/2011- post op  course complicated by wound infxn req I&D 03/2011  . Renal artery stenosis, native, bilateral (HCC)    a. bilateral renal artery stenosis by recent duplex ultrasound b. L renal artery stent 02/2015, R renal artery patent on angiogram  . Stroke ([place])   . TIA (transient ischemic attack) ~ 2013  . Type II diabetes mellitus ([place])     Past Surgical History:  Procedure Laterality Date  . ABDOMINAL AORTAGRAM N/A 12/16/2011   Procedure: ABDOMINAL Maxcine Ham;  Surgeon: Sherren Mocha, MD;  Location: [facility] CATH LAB;  Service: Cardiovascular;  Laterality: N/A;  . ANGIOPLASTY / STENTING ILIAC     Left external Iliac Artery  . AORTA - BILATERAL FEMORAL ARTERY BYPASS GRAFT  01/18/2012   Procedure: AORTA BIFEMORAL BYPASS GRAFT;  Surgeon: Curt Jews, MD;  Location: [facility];  Service: Vascular;  Laterality: N/A;  . AORTIC VALVE REPLACEMENT  ~ 2004  . CARDIAC CATHETERIZATION  11/2003   /pt report 10/01/2016  . CARDIAC  VALVE REPLACEMENT  11/2003   aortic  . CATARACT EXTRACTION W/ INTRAOCULAR LENS  IMPLANT, BILATERAL Bilateral   . CORONARY ARTERY BYPASS GRAFT  11/2003   Archie Endo 04/21/2011  . LOWER EXTREMITY ANGIOGRAM N/A 02/21/2015   Procedure: LOWER EXTREMITY ANGIOGRAM;  Surgeon: Lorretta Harp, MD;  Location[facility] C[facility] LAB;  Service: Cardiovascular;  Laterality: N/A;  . PERIPHERAL VASCULAR CATHETERIZATION N/A 04/22/2015   Procedure: Lower Extremity Angiography;  Surgeon: Lorretta Harp, MD;  Location: [facility] CV LAB;  Service: Cardiovascular;  Laterality: N/A;  . PERIPHERAL VASCULAR CATHETERIZATION N/A 08/24/2016   Procedure: Lower Extremity Angiography;  Surgeon: Lorretta Harp, MD;  Location: [facility] CV LAB;  Service: Cardiovascular;  Laterality: N/A;  . PERIPHERAL VASCULAR CATHETERIZATION Right 10/01/2016   Procedure: Peripheral Vascular Intervention - STENT;  Surgeon: Lorretta Harp, MD;  Location: [facility] CV LAB;  Service: Cardiovascular;  Laterality: Right;  Prox and MID SFA   . RENAL ANGIOGRAM N/A 02/21/2015   Procedure: RENAL ANGIOGRAM;  Surgeon: Lorretta Harp, MD;  Location: [facility] CATH LAB;  Service: Cardiovascular;  Laterality: Bilateral; 6 mm x 12 mm long Herculink balloon expandable stent to the left renal artery  . RENAL ARTERY STENT Left 04/22/2015   dr berry  . SPLENECTOMY  02/2003   Archie Endo 04/21/2011  . TONSILLECTOMY  ~ 1952    Prior to Admission medications   Medication Sig Start Date End Date Taking? Authorizing Provider  amLODipine (NORVASC) 10 MG tablet Take 10 mg by mouth daily. 02/11/15  Yes [provider]  aspirin EC 81 MG tablet Take 81 mg by mouth daily.   Yes [provider]  atorvastatin (LIPITOR) 80 MG tablet Take 80 mg by mouth daily.    Yes [provider]  canagliflozin (INVOKANA) 100 MG TABS tablet Take 100 mg by mouth daily.   Yes [provider]  Evolocumab (REPATHA SURECLICK) 449 MG/ML SOAJ Inject 140 mg into the skin  every 14 (fourteen) days.   Yes [provider]  glyBURIDE (DIABETA) 2.5 MG tablet Take 5 mg by mouth 2 (two) times daily with a meal.    Yes [provider]  hydrochlorothiazide (MICROZIDE) 12.5 MG capsule TAKE 1 CAPSULE BY MOUTH EVERY DAY Patient taking differently: Take 12.5 mg by mouth daily.  02/02/19  Yes Josue Hector, MD  metoprolol succinate (TOPROL-XL) 50 MG 24 hr tablet TAKE 1 TABLET (50 MG TOTAL) BY MOUTH DAILY. TAKE WITH OR IMMEDIATELY FOLLOWING A MEAL. 01/20/19  Yes Quay Burow  J, MD  omega-3 acid ethyl esters (LOVAZA) 1 g capsule Take 1 capsule (1 g total) by mouth 2 (two) times daily. 12/18/17  Yes Hongalgi, Lenis Dickinson, MD  sildenafil (REVATIO) 20 MG tablet Take 100 mg by mouth daily as needed (erectile dysfunction).    Yes [provider]  trandolapril (MAVIK) 4 MG tablet TAKE 1 TABLET BY MOUTH EVERY DAY Patient taking differently: Take 4 mg by mouth daily.  10/18/18  Yes Lorretta Harp, MD  warfarin (COUMADIN) 10 MG tablet TAKE AS DIRECTED BY COUMADIN CLINIC Patient taking differently: Take 5-10 mg by mouth See admin instructions. Take 1 tablet by mouth everyday Take 1/2 tablet by mouth on fridays 07/14/18  Yes Josue Hector, MD    Scheduled Meds: . atorvastatin  80 mg Oral Daily  . insulin aspart  0-9 Units Subcutaneous Q4H  . omega-3 acid ethyl esters  1 g Oral BID  . [START ON 02/27/2019] pantoprazole  40 mg Intravenous Q12H   Infusions: . pantoprozole (PROTONIX) infusion 8 mg/hr (02/24/19 0544)   PRN Meds: acetaminophen **OR** acetaminophen, ondansetron **OR** ondansetron (ZOFRAN) IV   Allergies as of 02/23/2019  . (No Known Allergies)    Family History  Problem Relation Age of Onset  . Coronary artery disease Mother        bypass surgery - deceased  . Heart disease Father        murmur, valve replacement - deceased  . Breast cancer Sister   . Diabetes Other        grandmother  . Diabetes Paternal Grandmother   . Diabetes  Paternal Aunt   . Colon cancer Neg Hx     Social History   Socioeconomic History  . Marital status: Divorced    Spouse name: Not on file  . Number of children: 3  . Years of education: Not on file  . Highest education level: Not on file  Occupational History  . Occupation: Retired  Scientific laboratory technician  . Financial resource strain: Not on file  . Food insecurity:    Worry: Not on file    Inability: Not on file  . Transportation needs:    Medical: Not on file    Non-medical: Not on file  Tobacco Use  . Smoking status: Former Smoker    Years: 30.00    Types: Cigarettes, Cigars    Last attempt to quit: 12/15/1993    Years since quitting: 25.2  . Smokeless tobacco: Never Used  Substance and Sexual Activity  . Alcohol use: Yes    Alcohol/week: 16.0 standard drinks    Types: 1 Cans of beer, 1 Shots of liquor, 14 Standard drinks or equivalent per week    Comment: drinks 2 martini's a night (2 shots in each)  . Drug use: No  . Sexual activity: Yes  Lifestyle  . Physical activity:    Days per week: Not on file    Minutes per session: Not on file  . Stress: Not on file  Relationships  . Social connections:    Talks on phone: Not on file    Gets together: Not on file    Attends religious service: Not on file    Active member of club or organization: Not on file    Attends meetings of clubs or organizations: Not on file    Relationship status: Not on file  . Intimate partner violence:    Fear of current or ex partner: Not on file    Emotionally abused:  Not on file    Physically abused: Not on file    Forced sexual activity: Not on file  Other Topics Concern  . Not on file  Social History Narrative   Tries to remain active.  Frequent golfer but claudication limits this.    REVIEW OF SYSTEMS: Constitutional: Per HPI.  Normally patient is very active. ENT:  No nose bleeds Pulm: No dyspnea.  No cough. CV:  No palpitations, no LE edema.  No chest pain.  No claudication. GU:  No  hematuria, no frequency GI: Per HPI. Heme: No unusual bleeding or bruising. Transfusions: 17 years ago when he had ITP which ultimately led to splenectomy.  This all occurred in [place]. Neuro:  No headaches, no peripheral tingling or numbness Derm:  No itching, no rash or sores.  Endocrine:  No sweats or chills.  No polyuria or dysuria.  Does not normally check his blood sugars at home but Dr. Virgina Jock is happy with his glucose control. Immunization: Did not inquire as to recent immunizations.   PHYSICAL EXAM: Vital signs in last 24 hours: Vitals:   02/24/19 0407 02/24/19 0805  BP: (!) 113/42 (!) 152/67  Pulse: 81   Resp: 16 19  Temp: 98.8 F (37.1 C) 99 F (37.2 C)  SpO2: 94% 93%   Wt Readings from Last 3 Encounters:  02/24/19 76.2 kg  08/22/18 79.7 kg  08/10/18 80.4 kg    General: Pleasant, well-appearing, alert, comfortable elderly WM. Head: No facial asymmetry or swelling.  No signs of head trauma. Eyes: Scleral icterus.  No conjunctival pallor.  EOMI. Ears: Not HOH Nose: No congestion or discharge. Mouth: Good dentition.  Tongue midline.  Oral mucosa moist, pink, clear. Neck: No JVD, no masses, no thyromegaly. Lungs: Air bilaterally.  No labored breathing, no cough. Heart: RRR.  No MRG.  Valve snap audible. Abdomen: Soft.  Not tender or distended.  No hepatomegaly, no masses.  No bruits.  Well-healed surgical scars. Rectal: Deferred Musc/Skeltl: No joint redness, swelling or significant deformity. Extremities: CCE. Neurologic: Fully alert and oriented.  Moves all 4 limbs, no tremor, no weakness.  No gross deficits. Skin: No telangiectasia, no significant purpura/bruising, no rash. Tattoos: None observed. Nodes: No cervical adenopathy Psych: Pleasant, calm, cooperative.  Good historian.  Intake/Output from previous day: 03/19 0701 - 03/20 0700 In: 969.6 [I.V.:197.3; Blood:673; IV Piggyback:99.3] Out: 600 [Urine:600] Intake/Output this shift: No intake/output data  recorded.  LAB RESULTS: Recent Labs    02/23/19 1801 02/24/19 0037 02/24/19 0601  WBC 10.7*  --   --   HGB 8.0* 8.0* 7.8*  HCT 24.4* 23.3* 23.6*  PLT 254  --   --    BMET Lab Results  Component Value Date   NA 139 02/24/2019   NA 135 02/23/2019   NA 137 12/21/2017   K 3.8 02/24/2019   K 4.4 02/23/2019   K 3.7 12/21/2017   CL 105 02/24/2019   CL 100 02/23/2019   CL 101 12/21/2017   CO2 27 02/24/2019   CO2 25 02/23/2019   CO2 25 12/21/2017   GLUCOSE 159 (H) 02/24/2019   GLUCOSE 299 (H) 02/23/2019   GLUCOSE 123 (H) 12/21/2017   BUN 30 (H) 02/24/2019   BUN 42 (H) 02/23/2019   BUN 15 12/21/2017   CREATININE 0.90 02/24/2019   CREATININE 1.04 02/23/2019   CREATININE 0.87 12/21/2017   CALCIUM 8.6 (L) 02/24/2019   CALCIUM 9.6 02/23/2019   CALCIUM 9.2 12/21/2017   LFT Recent Labs  02/23/19 1801  PROT 5.7*  ALBUMIN 3.4*  AST 40  ALT 49*  ALKPHOS 41  BILITOT 0.5   PT/INR Lab Results  Component Value Date   INR 6.4 (HH) 02/24/2019   INR 7.5 (HH) 02/23/2019   INR 2.7 02/14/2019   PROTIME 16.1 05/22/2009   PROTIME 23.6 05/08/2009   Hepatitis Panel No results for input(s): HEPBSAG, HCVAB, HEPAIGM, HEPBIGM in the last 72 hours. C-Diff No components found for: CDIFF Lipase     Component Value Date/Time   LIPASE 32 02/23/2019 1801    Drugs of Abuse     Component Value Date/Time   LABOPIA NONE DETECTED 12/13/2017 1551   COCAINSCRNUR NONE DETECTED 12/13/2017 1551   LABBENZ NONE DETECTED 12/13/2017 1551   AMPHETMU NONE DETECTED 12/13/2017 1551   THCU NONE DETECTED 12/13/2017 1551   LABBARB NONE DETECTED 12/13/2017 1551     RADIOLOGY STUDIES: Dg Chest Port 1 View  Result Date: 02/23/2019 CLINICAL DATA:  Shortness of breath EXAM: PORTABLE CHEST 1 VIEW COMPARISON:  12/20/2017 FINDINGS: The heart size and mediastinal contours are within normal limits. Both lungs are clear. Remote median sternotomy. The visualized skeletal structures are unremarkable.  IMPRESSION: No active disease. Electronically Signed   By: Ulyses Jarred M.D.   On: 02/23/2019 18:59     IMPRESSION:   *   Upper GIB with Melena.   Gastritis on EGD in 2017.  Diverticulosis, hemorrhoids, adenomatous and sessile serrated polyps on Colonoscopy  *   Blood loss anemia.  S/p 1 U PRBCs Remote history of ITP around 2003.  Ultimately underwent splenectomy.  *   Iatrogenic coagulopathy.  Chronic Coumadin s/p mechanical Ao valve.  Not clear why his INR shot up as there have been no recent changes to his medications and no recent antibiotics. INR improved after single dose p.o. vitamin K but still too high for safe endoscopy.  *   Hx CVA in setting of subtherapeutic INR.    PLAN:     *  Delay EGD to allow for INR to correct.   ? Give FFP?  However has had CVA in setting of sub therapeutic INR so may be safest to let INR drift.   He has not had a bowel movement for close to 48 hours so I think it is okay to observe patient and delay EGD Continue Protonix gtt.   Clear liquids   Azucena Freed  02/24/2019, 8:13 AM Phone [phone]

## 2019-02-25 LAB — BASIC METABOLIC PANEL
Anion gap: 10 (ref 5–15)
BUN: 16 mg/dL (ref 8–23)
CO2: 26 mmol/L (ref 22–32)
Calcium: 8.4 mg/dL — ABNORMAL LOW (ref 8.9–10.3)
Chloride: 102 mmol/L (ref 98–111)
Creatinine, Ser: 0.83 mg/dL (ref 0.61–1.24)
GFR calc Af Amer: >60 mL/min (ref >60)
GFR calc non Af Amer: >60 mL/min (ref >60)
Glucose, Bld: 143 mg/dL — ABNORMAL HIGH (ref 70–99)
Potassium: 3.6 mmol/L (ref 3.5–5.1)
SODIUM: 138 mmol/L (ref 135–145)

## 2019-02-25 LAB — CBC
HCT: 21.8 % — ABNORMAL LOW (ref 39.0–52.0)
Hemoglobin: 7.2 g/dL — ABNORMAL LOW (ref 13.0–17.0)
MCH: 32.1 pg (ref 26.0–34.0)
MCHC: 33 g/dL (ref 30.0–36.0)
MCV: 97.3 fL (ref 80.0–100.0)
Platelets: 246 10*3/uL (ref 150–400)
RBC: 2.24 MIL/uL — ABNORMAL LOW (ref 4.22–5.81)
RDW: 16.9 % — ABNORMAL HIGH (ref 11.5–15.5)
WBC: 10.3 10*3/uL (ref 4.0–10.5)
nRBC: 5.7 % — ABNORMAL HIGH (ref 0.0–0.2)

## 2019-02-25 LAB — GLUCOSE, CAPILLARY
GLUCOSE-CAPILLARY: 152 mg/dL — AB (ref 70–99)
Glucose-Capillary: 126 mg/dL — ABNORMAL HIGH (ref 70–99)
Glucose-Capillary: 131 mg/dL — ABNORMAL HIGH (ref 70–99)
Glucose-Capillary: 137 mg/dL — ABNORMAL HIGH (ref 70–99)
Glucose-Capillary: 147 mg/dL — ABNORMAL HIGH (ref 70–99)
Glucose-Capillary: 151 mg/dL — ABNORMAL HIGH (ref 70–99)
Glucose-Capillary: 184 mg/dL — ABNORMAL HIGH (ref 70–99)

## 2019-02-25 LAB — PROTIME-INR
INR: 4.3 (ref 0.8–1.2)
Prothrombin Time: 40.5 seconds — ABNORMAL HIGH (ref 11.4–15.2)

## 2019-02-25 NOTE — Progress Notes (Signed)
PROGRESS NOTE        PATIENT DETAILS Name: [name] [name] Age: 73 y.o. Sex: male Date of Birth: [birth year] Admit Date: 02/23/2019 Admitting Physician Etta Quill, DO OZH:YQMVH, Jenny Reichmann, MD  Brief Narrative: Patient is a 73 y.o. male with history of mechanical aortic valve replacement in 2004, CAD status post CABG in 2004, PAD-presented with upper GI bleeding with acute blood loss anemia in the setting of supratherapeutic INR.  See below for further details.  Subjective: No melanotic stools since 1 day prior to this hospitalization.  Lying comfortably in bed.  Assessment/Plan: Upper GI bleeding with acute blood loss anemia: Melena for close to 3 days-continue PPI.  Hemoglobin stable at 7.2-since no further bleeding-suspect we could monitor for now without any further transfusion (shortage of blood products per blood bank).  GI following-EGD most likely tomorrow or day after.  Supratherapeutic INR: Given vitamin K on day of admission-INR slowly downtrending, down to 4.3 this morning.  Repeat INR tomorrow.  Since no active bleeding-letting it drift down on its own, if in the event patient starts having recurrent GI bleeding-we will need to reverse INR with FFP/vitamin K.    DM-2: CBG stable-continue SSI.  Continue to hold all antihypertensive medications.  Hypertension: Blood pressure stable-all antihypertensives remain on hold  History of mechanical valve replacement secondary to aortic stenosis due to bicuspid aortic valve: Anticoagulation on hold due to GI bleeding  History of CAD s/p CABG and PAD: Stable-anticoagulation on hold  DVT Prophylaxis: SCD's  Code Status: Full code   Family Communication: None at bedside  Disposition Plan: Remain inpatient  Antimicrobial agents: Anti-infectives (From admission, onward)   None      Procedures: None  CONSULTS:  cardiology and GI  Time spent: 25- minutes-Greater than 50% of this time was  spent in counseling, explanation of diagnosis, planning of further management, and coordination of care.  MEDICATIONS: Scheduled Meds: . atorvastatin  80 mg Oral Daily  . insulin aspart  0-9 Units Subcutaneous Q4H  . omega-3 acid ethyl esters  1 g Oral BID  . [START ON 02/27/2019] pantoprazole  40 mg Intravenous Q12H   Continuous Infusions: . pantoprozole (PROTONIX) infusion 8 mg/hr (02/25/19 1317)   PRN Meds:.acetaminophen **OR** acetaminophen, ondansetron **OR** ondansetron (ZOFRAN) IV   PHYSICAL EXAM: Vital signs: Vitals:   02/24/19 2048 02/25/19 0027 02/25/19 0443 02/25/19 0842  BP: (!) 136/49 (!) 110/41 (!) 142/50 (!) 141/82  Pulse: 86 86 87 94  Resp: 20 19    Temp: 98.6 F (37 C) 99.6 F (37.6 C) 98.9 F (37.2 C)   TempSrc: Oral Oral Oral   SpO2: 95% 93% 95% 96%  Weight:      Height:       Filed Weights   02/23/19 1816 02/24/19 0407  Weight: 77.1 kg 76.2 kg   Body mass index is 25.54 kg/m.   General appearance:Awake, alert, not in any distress.  Eyes:no scleral icterus. HEENT: Atraumatic and Normocephalic Neck: supple, no JVD. Resp:Good air entry bilaterally,no rales or rhonchi CVS: S1 S2 regular,+ metallic click GI: Bowel sounds present, Non tender and not distended with no gaurding, rigidity or rebound. Extremities: B/L Lower Ext shows no edema, both legs are warm to touch Neurology:  Non focal Psychiatric: Normal judgment and insight. Normal mood. Musculoskeletal:No digital cyanosis Skin:No Rash, warm and dry Wounds:N/A  I have  personally reviewed following labs and imaging studies  LABORATORY DATA: CBC: Recent Labs  Lab 02/23/19 1801 02/24/19 0037 02/24/19 0601 02/24/19 1540 02/25/19 0413  WBC 10.7*  --   --   --  10.3  NEUTROABS 6.1  --   --   --   --   HGB 8.0* 8.0* 7.8* 8.1* 7.2*  HCT 24.4* 23.3* 23.6* 24.8* 21.8*  MCV 96.4  --   --   --  97.3  PLT 254  --   --   --  355    Basic Metabolic Panel: Recent Labs  Lab 02/23/19 1801  02/24/19 0601 02/25/19 0413  NA 135 139 138  K 4.4 3.8 3.6  CL 100 105 102  CO2 25 27 26   GLUCOSE 299* 159* 143*  BUN 42* 30* 16  CREATININE 1.04 0.90 0.83  CALCIUM 9.6 8.6* 8.4*    GFR: Estimated Creatinine Clearance: 76.7 mL/min (by C-G formula based on SCr of 0.83 mg/dL).  Liver Function Tests: Recent Labs  Lab 02/23/19 1801  AST 40  ALT 49*  ALKPHOS 41  BILITOT 0.5  PROT 5.7*  ALBUMIN 3.4*   Recent Labs  Lab 02/23/19 1801  LIPASE 32   No results for input(s): AMMONIA in the last 168 hours.  Coagulation Profile: Recent Labs  Lab 02/23/19 1835 02/24/19 0601 02/24/19 1121 02/25/19 0413  INR 7.5* 6.4* 6.4* 4.3*    Cardiac Enzymes: Recent Labs  Lab 02/23/19 1801  TROPONINI <0.03    BNP (last 3 results) No results for input(s): PROBNP in the last 8760 hours.  HbA1C: No results for input(s): HGBA1C in the last 72 hours.  CBG: Recent Labs  Lab 02/24/19 2044 02/25/19 0021 02/25/19 0426 02/25/19 0801 02/25/19 1142  GLUCAP 157* 131* 137* 147* 151*    Lipid Profile: No results for input(s): CHOL, HDL, LDLCALC, TRIG, CHOLHDL, LDLDIRECT in the last 72 hours.  Thyroid Function Tests: No results for input(s): TSH, T4TOTAL, FREET4, T3FREE, THYROIDAB in the last 72 hours.  Anemia Panel: No results for input(s): VITAMINB12, FOLATE, FERRITIN, TIBC, IRON, RETICCTPCT in the last 72 hours.  Urine analysis:    Component Value Date/Time   COLORURINE STRAW (A) 12/20/2017 1924   APPEARANCEUR CLEAR 12/20/2017 1924   LABSPEC 1.020 12/20/2017 1924   PHURINE 7.0 12/20/2017 1924   GLUCOSEU >=500 (A) 12/20/2017 1924   HGBUR NEGATIVE 12/20/2017 1924   BILIRUBINUR NEGATIVE 12/20/2017 1924   KETONESUR 5 (A) 12/20/2017 1924   PROTEINUR NEGATIVE 12/20/2017 1924   UROBILINOGEN 0.2 01/15/2012 1415   NITRITE NEGATIVE 12/20/2017 1924   LEUKOCYTESUR NEGATIVE 12/20/2017 1924    Sepsis Labs: Lactic Acid, Venous No results found for: LATICACIDVEN  MICROBIOLOGY:  Recent Results (from the past 240 hour(s))  MRSA PCR Screening     Status: None   Collection Time: 02/23/19 11:05 PM  Result Value Ref Range Status   MRSA by PCR NEGATIVE NEGATIVE Final    Comment:        The GeneXpert MRSA Assay (FDA approved for NASAL specimens only), is one component of a comprehensive MRSA colonization surveillance program. It is not intended to diagnose MRSA infection nor to guide or monitor treatment for MRSA infections. Performed at [facility], [place] [address] [place], [place] [address]     RADIOLOGY STUDIES/RESULTS: Dg Chest Port 1 View  Result Date: 02/23/2019 CLINICAL DATA:  Shortness of breath EXAM: PORTABLE CHEST 1 VIEW COMPARISON:  12/20/2017 FINDINGS: The heart size and mediastinal contours are within normal  limits. Both lungs are clear. Remote median sternotomy. The visualized skeletal structures are unremarkable. IMPRESSION: No active disease. Electronically Signed   By: Ulyses Jarred M.D.   On: 02/23/2019 18:59     LOS: 2 days   Oren Binet, MD  [facility]  If 7PM-7AM, please contact night-coverage  Please page via [email or web address]  Go to [email or web address] and use [place]'s universal password to access. If you do not have the password, please contact the hospital operator.  Locate the [facility] provider you are looking for under [facility] and page to a number that you can be directly reached. If you still have difficulty reaching the provider, please page the [facility] (Director on Call) for the Hospitalists listed on amion for assistance.  02/25/2019, 1:47 PM

## 2019-02-25 NOTE — Progress Notes (Addendum)
Daily Rounding Note  02/25/2019, 12:07 PM  LOS: 2 days   SUBJECTIVE:   Chief complaint: Tarry stools, GI bleed, anemia, supratherapeutic INR     No further tarry stools yest or today.  No nausea.  Diet advanced to full liquids..  Denies dizziness, chest pain, dyspnea at rest.  OBJECTIVE:         Vital signs in last 24 hours:    Temp:  [97.9 F (36.6 C)-99.6 F (37.6 C)] 98.9 F (37.2 C) (03/21 0443) Pulse Rate:  [82-94] 94 (03/21 0842) Resp:  [18-20] 19 (03/21 0027) BP: (108-142)/(41-82) 141/82 (03/21 0842) SpO2:  [93 %-98 %] 96 % (03/21 0842) Last BM Date: 02/22/19 Filed Weights   02/23/19 1816 02/24/19 0407  Weight: 77.1 kg 76.2 kg   General: Looks well.  No distress, not ill-appearing. Heart: RRR.  Valve click. Chest: Clear bilaterally.  No cough or labored breathing. Abdomen: Soft.  Not tender or distended.  No masses.  No organomegaly.  Active bowel sounds. Extremities: CCE. Neuro/Psych: Oriented x3.  Fully alert.  No deficits.  Intake/Output from previous day: 03/20 0701 - 03/21 0700 In: 1363.4 [P.O.:1080; I.V.:283.4] Out: 860 [Urine:860]  Intake/Output this shift: No intake/output data recorded.  Lab Results: Recent Labs    02/23/19 1801  02/24/19 0601 02/24/19 1540 02/25/19 0413  WBC 10.7*  --   --   --  10.3  HGB 8.0*   < > 7.8* 8.1* 7.2*  HCT 24.4*   < > 23.6* 24.8* 21.8*  PLT 254  --   --   --  246   < > = values in this interval not displayed.   BMET Recent Labs    02/23/19 1801 02/24/19 0601 02/25/19 0413  NA 135 139 138  K 4.4 3.8 3.6  CL 100 105 102  CO2 25 27 26   GLUCOSE 299* 159* 143*  BUN 42* 30* 16  CREATININE 1.04 0.90 0.83  CALCIUM 9.6 8.6* 8.4*   LFT Recent Labs    02/23/19 1801  PROT 5.7*  ALBUMIN 3.4*  AST 40  ALT 49*  ALKPHOS 41  BILITOT 0.5   PT/INR Recent Labs    02/24/19 1121 02/25/19 0413  LABPROT 55.4* 40.5*  INR 6.4* 4.3*   Hepatitis Panel  No results for input(s): HEPBSAG, HCVAB, HEPAIGM, HEPBIGM in the last 72 hours.  Studies/Results: Dg Chest Port 1 View  Result Date: 02/23/2019 CLINICAL DATA:  Shortness of breath EXAM: PORTABLE CHEST 1 VIEW COMPARISON:  12/20/2017 FINDINGS: The heart size and mediastinal contours are within normal limits. Both lungs are clear. Remote median sternotomy. The visualized skeletal structures are unremarkable. IMPRESSION: No active disease. Electronically Signed   By: Ulyses Jarred M.D.   On: 02/23/2019 18:59   Scheduled Meds: . atorvastatin  80 mg Oral Daily  . insulin aspart  0-9 Units Subcutaneous Q4H  . omega-3 acid ethyl esters  1 g Oral BID  . [START ON 02/27/2019] pantoprazole  40 mg Intravenous Q12H   Continuous Infusions: . pantoprozole (PROTONIX) infusion 8 mg/hr (02/25/19 0246)   PRN Meds:.acetaminophen **OR** acetaminophen, ondansetron **OR** ondansetron (ZOFRAN) IV   ASSESMENT:   *   Upper GIB with Melena.  Day 2 IV Protonix, finishes 3/22 at 1800 Gastritis on EGD in 2017.  Diverticulosis, hemorrhoids, adenomatous and sessile serrated polyps on Colonoscopy  *   Blood loss anemia.  S/p 1 U PRBCs Hgb 8 >> 7.2. Remote history of ITP around  2003.  Ultimately underwent splenectomy.  *   Iatrogenic coagulopathy.  Chronic Coumadin s/p mechanical Ao valve.  Not clear why his INR shot up as there have been no recent changes to his medications and no recent antibiotics. Received 5 mg vitamin K p.o. on 3/19 INR 7.5 >> 4.3  *   Hx CVA in setting of subtherapeutic INR.  For this reason no plans to aggressively correct INR   PLAN   *   EGD, likely timing of Monday 3/23 Cbc in AM    [name] [name]  02/25/2019, 12:07 PM Phone [phone]    Attending physician's note   I have taken an interval history, reviewed the chart and examined the patient. I agree with the Advanced Practitioner's note, impression and recommendations.   73 year old with UGI bleed, AVR on Coumadin. S/P  1U PRBC. Hb 7.2 today. INR 4.3.  No active bleeding. Plan: Trend CBC, INR.  IV Protonix, EGD once INR <2.0.  If INR still significantly high tomorrow, give low dose vitamin K.  Carmell Austria, MD

## 2019-02-25 NOTE — Progress Notes (Signed)
Progress Note  Patient Name: [name] [name] Date of Encounter: 02/25/2019  Primary Cardiologist: Jenkins Rouge, MD  Subjective   No symptoms at rest.  No chest pain or shortness of breath.  No lightheadedness.  Has had no further bloody bowel movements to his knowledge.  Inpatient Medications    Scheduled Meds: . atorvastatin  80 mg Oral Daily  . insulin aspart  0-9 Units Subcutaneous Q4H  . omega-3 acid ethyl esters  1 g Oral BID  . [START ON 02/27/2019] pantoprazole  40 mg Intravenous Q12H   Continuous Infusions: . pantoprozole (PROTONIX) infusion 8 mg/hr (02/25/19 0246)   PRN Meds: acetaminophen **OR** acetaminophen, ondansetron **OR** ondansetron (ZOFRAN) IV   Vital Signs    Vitals:   02/24/19 2048 02/25/19 0027 02/25/19 0443 02/25/19 0842  BP: (!) 136/49 (!) 110/41 (!) 142/50 (!) 141/82  Pulse: 86 86 87 94  Resp: 20 19    Temp: 98.6 F (37 C) 99.6 F (37.6 C) 98.9 F (37.2 C)   TempSrc: Oral Oral Oral   SpO2: 95% 93% 95% 96%  Weight:      Height:        Intake/Output Summary (Last 24 hours) at 02/25/2019 1052 Last data filed at 02/25/2019 0447 Gross per 24 hour  Intake 1243.35 ml  Output 510 ml  Net 733.35 ml   Last 3 Weights 02/24/2019 02/23/2019 08/22/2018  Weight (lbs) 167 lb 15.9 oz 170 lb 175 lb 9.6 oz  Weight (kg) 76.2 kg 77.111 kg 79.652 kg     Telemetry    Sinus rhythm personally Reviewed  Physical Exam   GEN: Well nourished, well developed, in no acute distress  HEENT: normal  Neck: no JVD, carotid bruits, or masses Cardiac: RRR; brisk click of aortic valve, no murmurs, rubs, or gallops,no edema  Respiratory:  clear to auscultation bilaterally, normal work of breathing GI: soft, nontender, nondistended, + BS MS: no deformity or atrophy  Skin: warm and dry Neuro:  Strength and sensation are intact Psych: euthymic mood, full affect   Labs    Chemistry Recent Labs  Lab 02/23/19 1801 02/24/19 0601 02/25/19 0413  NA 135 139  138  K 4.4 3.8 3.6  CL 100 105 102  CO2 25 27 26   GLUCOSE 299* 159* 143*  BUN 42* 30* 16  CREATININE 1.04 0.90 0.83  CALCIUM 9.6 8.6* 8.4*  PROT 5.7*  --   --   ALBUMIN 3.4*  --   --   AST 40  --   --   ALT 49*  --   --   ALKPHOS 41  --   --   BILITOT 0.5  --   --   GFRNONAA >60 >60 >60  GFRAA >60 >60 >60  ANIONGAP 10 7 10      Hematology Recent Labs  Lab 02/23/19 1801  02/24/19 0601 02/24/19 1540 02/25/19 0413  WBC 10.7*  --   --   --  10.3  RBC 2.53*  --   --   --  2.24*  HGB 8.0*   < > 7.8* 8.1* 7.2*  HCT 24.4*   < > 23.6* 24.8* 21.8*  MCV 96.4  --   --   --  97.3  MCH 31.6  --   --   --  32.1  MCHC 32.8  --   --   --  33.0  RDW 14.0  --   --   --  16.9*  PLT 254  --   --   --  246   < > = values in this interval not displayed.    Cardiac Enzymes Recent Labs  Lab 02/23/19 1801  TROPONINI <0.03   No results for input(s): TROPIPOC in the last 168 hours.   BNPNo results for input(s): BNP, PROBNP in the last 168 hours.   DDimer No results for input(s): DDIMER in the last 168 hours.   Radiology    Dg Chest Port 1 View  Result Date: 02/23/2019 CLINICAL DATA:  Shortness of breath EXAM: PORTABLE CHEST 1 VIEW COMPARISON:  12/20/2017 FINDINGS: The heart size and mediastinal contours are within normal limits. Both lungs are clear. Remote median sternotomy. The visualized skeletal structures are unremarkable. IMPRESSION: No active disease. Electronically Signed   By: Ulyses Jarred M.D.   On: 02/23/2019 18:59    Cardiac Studies   2D echo 12/14/17 Study Conclusions  - Left ventricle: The cavity size was normal. Wall thickness was   increased in a pattern of moderate LVH. Systolic function was   normal. The estimated ejection fraction was in the range of 60%   to 65%. Left ventricular diastolic function parameters were   normal. - Aortic valve: Normal appearing mechanical AVR with no peri   valvular regurgitation Valve area (VTI): 1.45 cm^2. Valve area   (Vmax):  1.11 cm^2. Valve area (Vmean): 1.32 cm^2. - Mitral valve: Calcified annulus. Mildly thickened leaflets .   Valve area by pressure half-time: 1.22 cm^2. - Atrial septum: No defect or patent foramen ovale was identified.  Patient Profile     73 y.o. male with AS s/p STJ mech AVR (2004) on coumadin, CAD s/p CABG in 2004 at time of AVR, DM, HTN, HLD, ITP, PAD s/p aobifem, L iliac stent who presented to [facility] with lightheadedness and melanotic stools with probable acute GIB and ABL anemia.  Assessment & Plan    1. Acute GIB -setting of supratherapeutic INR.  Has received vitamin K.  INR today is 4.3.  He does have a mechanical aortic valve and thus [name] [name] need an INR goal of 2-3.  He has had strokes when his INR has been low apparently.  Plan per primary team.  Is anemic but is minimally symptomatic.  2. CAD -continue statin and beta-blocker.  No current chest pain despite significant anemia.  3. HTN -currently on hold.  Blood pressure mildly elevated.  No changes.  For questions or updates, please contact [place] Please consult [email or web address] for contact info under Cardiology/STEMI.  Signed, Jazelyn Sipe Meredith Leeds, MD 02/25/2019, 10:52 AM    [facility] Froman sign off.   Medication Recommendations:  none Other recommendations (labs, testing, etc): INR goal 2-3 due to mechanical aortic valve Follow up as an outpatient: As planned per outpatient cardiologist

## 2019-02-25 NOTE — Progress Notes (Signed)
Patient verbalized concerns about elevated INR at 6.4, he informs that he would like an additional dose of Vitamin K.  Hopitalist notified with patients request.  Patient educated on rationale for one dose with follow up serum labs.  Levels redrawn with morning labs and remain INR remains critical, but trending down at 4.3 mg/dl.

## 2019-02-26 LAB — CBC
HCT: 22.8 % — ABNORMAL LOW (ref 39.0–52.0)
Hemoglobin: 7.4 g/dL — ABNORMAL LOW (ref 13.0–17.0)
MCH: 32.3 pg (ref 26.0–34.0)
MCHC: 32.5 g/dL (ref 30.0–36.0)
MCV: 99.6 fL (ref 80.0–100.0)
Platelets: 278 10*3/uL (ref 150–400)
RBC: 2.29 MIL/uL — ABNORMAL LOW (ref 4.22–5.81)
RDW: 18.2 % — ABNORMAL HIGH (ref 11.5–15.5)
WBC: 10.2 10*3/uL (ref 4.0–10.5)
nRBC: 3.8 % — ABNORMAL HIGH (ref 0.0–0.2)

## 2019-02-26 LAB — GLUCOSE, CAPILLARY
Glucose-Capillary: 107 mg/dL — ABNORMAL HIGH (ref 70–99)
Glucose-Capillary: 126 mg/dL — ABNORMAL HIGH (ref 70–99)
Glucose-Capillary: 146 mg/dL — ABNORMAL HIGH (ref 70–99)
Glucose-Capillary: 163 mg/dL — ABNORMAL HIGH (ref 70–99)
Glucose-Capillary: 116 mg/dL — ABNORMAL HIGH (ref 70–99)
Glucose-Capillary: 210 mg/dL — ABNORMAL HIGH (ref 70–99)

## 2019-02-26 LAB — PROTIME-INR
INR: 2.9 — ABNORMAL HIGH (ref 0.8–1.2)
Prothrombin Time: 29.6 seconds — ABNORMAL HIGH (ref 11.4–15.2)

## 2019-02-26 MED ORDER — AMLODIPINE BESYLATE 5 MG PO TABS
5.0000 mg | ORAL_TABLET | Freq: Every day | ORAL | Status: DC
  Administered 2019-02-26 – 2019-03-01 (×4): 5 mg via ORAL
  Filled 2019-02-26 (×4): qty 1

## 2019-02-26 MED ORDER — PANTOPRAZOLE SODIUM 40 MG PO TBEC
40.0000 mg | DELAYED_RELEASE_TABLET | Freq: Two times a day (BID) | ORAL | Status: DC
  Administered 2019-02-26 – 2019-03-01 (×6): 40 mg via ORAL
  Filled 2019-02-26 (×6): qty 1

## 2019-02-26 NOTE — Progress Notes (Addendum)
Daily Rounding Note  02/26/2019, 2:36 PM  LOS: 3 days   SUBJECTIVE:   Chief complaint: GI bleed with tarry stools and blood loss anemia in setting of supratherapeutic INR.  Patient feels well.  No further tarry stools, no stools at all since admission. Tolerating full liquid diet.  OBJECTIVE:         Vital signs in last 24 hours:    Temp:  [98.1 F (36.7 C)-99.5 F (37.5 C)] 98.3 F (36.8 C) (03/22 1259) Pulse Rate:  [77-95] 84 (03/22 1259) Resp:  [16-20] 18 (03/22 0405) BP: (115-165)/(25-69) 131/40 (03/22 1259) SpO2:  [94 %-98 %] 95 % (03/22 1259) Last BM Date: 02/22/19 Filed Weights   02/23/19 1816 02/24/19 0407  Weight: 77.1 kg 76.2 kg   General: looks well Heart: NSR in 80s on telemetry monitor. Chest: Clear bilaterally.  No labored breathing or cough. Abdomen: Tender, non-distended.  Soft.  Active bowel sounds. Extremities: No CCE. Neuro/Psych: Fully alert and oriented x3.  No tremors, no weakness.  Intake/Output from previous day: 03/21 0701 - 03/22 0700 In: 480 [P.O.:480] Out: -   Intake/Output this shift: Total I/O In: 1426 [P.O.:480; I.V.:946] Out: 400 [Urine:400]  Lab Results: Recent Labs    02/23/19 1801  02/24/19 1540 02/25/19 0413 02/26/19 0451  WBC 10.7*  --   --  10.3 10.2  HGB 8.0*   < > 8.1* 7.2* 7.4*  HCT 24.4*   < > 24.8* 21.8* 22.8*  PLT 254  --   --  246 278   < > = values in this interval not displayed.   BMET Recent Labs    02/23/19 1801 02/24/19 0601 02/25/19 0413  NA 135 139 138  K 4.4 3.8 3.6  CL 100 105 102  CO2 25 27 26   GLUCOSE 299* 159* 143*  BUN 42* 30* 16  CREATININE 1.04 0.90 0.83  CALCIUM 9.6 8.6* 8.4*   LFT Recent Labs    02/23/19 1801  PROT 5.7*  ALBUMIN 3.4*  AST 40  ALT 49*  ALKPHOS 41  BILITOT 0.5   PT/INR Recent Labs    02/25/19 0413 02/26/19 0451  LABPROT 40.5* 29.6*  INR 4.3* 2.9*     ASSESMENT:   *UpperGIBwith  Melena.Day 2 IV Protonix, finishes today at 1800 Gastritis on EGD in 2017. Diverticulosis, hemorrhoids, adenomatous and sessile serrated polyps on Colonoscopy  * Blood loss anemia. S/p 1 U PRBCs Hgb 8 >> 7.2 >> 7.4. Remote history of ITP around 2003. Ultimately underwent splenectomy.  * Iatrogenic coagulopathy. Chronic Coumadin s/p mechanical Ao valve. Not clear why his INR shot up as there have been no recent changes to his medications and no recent antibiotics. Received 5 mg vitamin K p.o. on 3/19 INR 7.5 >> 2.9.    * Hx CVA in setting of subtherapeutic INR.  For this reason no plans to aggressively correct INR    PLAN   *   EGD tomorrow if INR 2 or less.    *   Begin oral Protonix this PM.  CBC and PT/INR in AM   Azucena Freed  02/26/2019, 2:36 PM Phone [phone]    Attending physician's note   I have taken an interval history, reviewed the chart and examined the patient. I agree with the Advanced Practitioner's note, impression and recommendations.   73 year old with UGI bleed (in setting of supratherapeutic INR), AVR on Coumadin. S/P 1U PRBC. Hb 7.4 today. INR 2.9.  No active bleeding. Plan: Trend CBC, INR.  Protonix, EGD once INR <2.0 (likely tomorrow).  Carmell Austria, MD

## 2019-02-26 NOTE — Progress Notes (Signed)
PROGRESS NOTE        PATIENT DETAILS Name: [name] [name] Age: 73 y.o. Sex: male Date of Birth: 1945-12-18 Admit Date: 02/23/2019 Admitting Physician Etta Quill, DO TKP:[id number], Jenny Reichmann, MD  Brief Narrative: Patient is a 73 y.o. male with history of mechanical aortic valve replacement in 2004, CAD status post CABG in 2004, PAD-presented with upper GI bleeding with acute blood loss anemia in the setting of supratherapeutic INR.  See below for further details.  Subjective: No hematochezia since 1 day prior to this hospitalization.  Denies any chest pain or shortness of breath.  Assessment/Plan: Upper GI bleeding with acute blood loss anemia: Melena for close to 4 days-continue PPI.  Hemoglobin stable at 7.4-since no further bleeding-suspect we could monitor for now without any further transfusion (shortage of blood products per blood bank).  GI following-EGD most likely tomorrow once INR is less than 2  Supratherapeutic INR: Given vitamin K on day of admission-INR slowly downtrending, down to 2.9 this morning.  Repeat INR tomorrow.  Since no active bleeding-letting it drift down on its own (prior history of CVA x2 with subtherapeutic INR)-GI planning on EGD once INR is less than 2.  DM-2: CBGs stable-continue SSI  Hypertension: Blood pressure slowly creeping up-resume amlodipine at half of home dosing.  History of mechanical valve replacement secondary to aortic stenosis due to bicuspid aortic valve: Anticoagulation on hold due to GI bleeding  History of CAD s/p CABG and PAD: Stable-anticoagulation on hold  DVT Prophylaxis: SCD's  Code Status: Full code   Family Communication: None at bedside  Disposition Plan: Remain inpatient  Antimicrobial agents: Anti-infectives (From admission, onward)   None      Procedures: None  CONSULTS:  cardiology and GI  Time spent: 25- minutes-Greater than 50% of this time was spent in counseling,  explanation of diagnosis, planning of further management, and coordination of care.  MEDICATIONS: Scheduled Meds: . atorvastatin  80 mg Oral Daily  . insulin aspart  0-9 Units Subcutaneous Q4H  . omega-3 acid ethyl esters  1 g Oral BID  . [START ON 02/27/2019] pantoprazole  40 mg Intravenous Q12H   Continuous Infusions: . pantoprozole (PROTONIX) infusion 8 mg/hr (02/26/19 1104)   PRN Meds:.acetaminophen **OR** acetaminophen, ondansetron **OR** ondansetron (ZOFRAN) IV   PHYSICAL EXAM: Vital signs: Vitals:   02/25/19 2016 02/26/19 0000 02/26/19 0405 02/26/19 0932  BP: (!) 138/33 (!) 152/53 (!) 165/41 (!) 140/28  Pulse: 84 77 82 88  Resp: 16 20 18    Temp: 98.9 F (37.2 C) 99.3 F (37.4 C) 99.5 F (37.5 C) 98.1 F (36.7 C)  TempSrc: Oral Oral Oral   SpO2: 96% 94% 96% 98%  Weight:      Height:       Filed Weights   02/23/19 1816 02/24/19 0407  Weight: 77.1 kg 76.2 kg   Body mass index is 25.54 kg/m.   General appearance:Awake, alert, not in any distress.  Eyes:no scleral icterus. HEENT: Atraumatic and Normocephalic Neck: supple, no JVD. Resp:Good air entry bilaterally,no rales or rhonchi CVS: S1 S2 regular, + metallic click GI: Bowel sounds present, Non tender and not distended with no gaurding, rigidity or rebound. Extremities: B/L Lower Ext shows no edema, both legs are warm to touch Neurology:  Non focal Psychiatric: Normal judgment and insight. Normal mood. Musculoskeletal:No digital cyanosis Skin:No Rash, warm and dry  Wounds:N/A  I have personally reviewed following labs and imaging studies  LABORATORY DATA: CBC: Recent Labs  Lab 02/23/19 1801 02/24/19 0037 02/24/19 0601 02/24/19 1540 02/25/19 0413 02/26/19 0451  WBC 10.7*  --   --   --  10.3 10.2  NEUTROABS 6.1  --   --   --   --   --   HGB 8.0* 8.0* 7.8* 8.1* 7.2* 7.4*  HCT 24.4* 23.3* 23.6* 24.8* 21.8* 22.8*  MCV 96.4  --   --   --  97.3 99.6  PLT 254  --   --   --  246 025    Basic Metabolic  Panel: Recent Labs  Lab 02/23/19 1801 02/24/19 0601 02/25/19 0413  NA 135 139 138  K 4.4 3.8 3.6  CL 100 105 102  CO2 25 27 26   GLUCOSE 299* 159* 143*  BUN 42* 30* 16  CREATININE 1.04 0.90 0.83  CALCIUM 9.6 8.6* 8.4*    GFR: Estimated Creatinine Clearance: 76.7 mL/min (by C-G formula based on SCr of 0.83 mg/dL).  Liver Function Tests: Recent Labs  Lab 02/23/19 1801  AST 40  ALT 49*  ALKPHOS 41  BILITOT 0.5  PROT 5.7*  ALBUMIN 3.4*   Recent Labs  Lab 02/23/19 1801  LIPASE 32   No results for input(s): AMMONIA in the last 168 hours.  Coagulation Profile: Recent Labs  Lab 02/23/19 1835 02/24/19 0601 02/24/19 1121 02/25/19 0413 02/26/19 0451  INR 7.5* 6.4* 6.4* 4.3* 2.9*    Cardiac Enzymes: Recent Labs  Lab 02/23/19 1801  TROPONINI <0.03    BNP (last 3 results) No results for input(s): PROBNP in the last 8760 hours.  HbA1C: No results for input(s): HGBA1C in the last 72 hours.  CBG: Recent Labs  Lab 02/25/19 2014 02/25/19 2358 02/26/19 0403 02/26/19 0754 02/26/19 0818  GLUCAP 184* 126* 107* 126* 146*    Lipid Profile: No results for input(s): CHOL, HDL, LDLCALC, TRIG, CHOLHDL, LDLDIRECT in the last 72 hours.  Thyroid Function Tests: No results for input(s): TSH, T4TOTAL, FREET4, T3FREE, THYROIDAB in the last 72 hours.  Anemia Panel: No results for input(s): VITAMINB12, FOLATE, FERRITIN, TIBC, IRON, RETICCTPCT in the last 72 hours.  Urine analysis:    Component Value Date/Time   COLORURINE STRAW (A) 12/20/2017 1924   APPEARANCEUR CLEAR 12/20/2017 1924   LABSPEC 1.020 12/20/2017 1924   PHURINE 7.0 12/20/2017 1924   GLUCOSEU >=500 (A) 12/20/2017 1924   HGBUR NEGATIVE 12/20/2017 1924   BILIRUBINUR NEGATIVE 12/20/2017 1924   KETONESUR 5 (A) 12/20/2017 1924   PROTEINUR NEGATIVE 12/20/2017 1924   UROBILINOGEN 0.2 01/15/2012 1415   NITRITE NEGATIVE 12/20/2017 1924   LEUKOCYTESUR NEGATIVE 12/20/2017 1924    Sepsis Labs: Lactic  Acid, Venous No results found for: LATICACIDVEN  MICROBIOLOGY: Recent Results (from the past 240 hour(s))  MRSA PCR Screening     Status: None   Collection Time: 02/23/19 11:05 PM  Result Value Ref Range Status   MRSA by PCR NEGATIVE NEGATIVE Final    Comment:        The GeneXpert MRSA Assay (FDA approved for NASAL specimens only), is one component of a comprehensive MRSA colonization surveillance program. It is not intended to diagnose MRSA infection nor to guide or monitor treatment for MRSA infections. Performed at [facility], [place] [address] [place], [place] [address]     RADIOLOGY STUDIES/RESULTS: Dg Chest Port 1 View  Result Date: 02/23/2019 CLINICAL DATA:  Shortness of breath EXAM:  PORTABLE CHEST 1 VIEW COMPARISON:  12/20/2017 FINDINGS: The heart size and mediastinal contours are within normal limits. Both lungs are clear. Remote median sternotomy. The visualized skeletal structures are unremarkable. IMPRESSION: No active disease. Electronically Signed   By: Ulyses Jarred M.D.   On: 02/23/2019 18:59     LOS: 3 days   Oren Binet, MD  [facility]  If 7PM-7AM, please contact night-coverage  Please page via [email or web address]  Go to [email or web address] and use [facility]'s universal password to access. If you do not have the password, please contact the hospital operator.  Locate the [facility] provider you are looking for under [facility] and page to a number that you can be directly reached. If you still have difficulty reaching the provider, please page the [facility] (Director on Call) for the Hospitalists listed on amion for assistance.  02/26/2019, 12:07 PM

## 2019-02-26 NOTE — H&P (View-Only) (Signed)
Daily Rounding Note  02/26/2019, 2:36 PM  LOS: 3 days   SUBJECTIVE:   Chief complaint: GI bleed with tarry stools and blood loss anemia in setting of supratherapeutic INR.  Patient feels well.  No further tarry stools, no stools at all since admission. Tolerating full liquid diet.  OBJECTIVE:         Vital signs in last 24 hours:    Temp:  [98.1 F (36.7 C)-99.5 F (37.5 C)] 98.3 F (36.8 C) (03/22 1259) Pulse Rate:  [77-95] 84 (03/22 1259) Resp:  [16-20] 18 (03/22 0405) BP: (115-165)/(25-69) 131/40 (03/22 1259) SpO2:  [94 %-98 %] 95 % (03/22 1259) Last BM Date: 02/22/19 Filed Weights   02/23/19 1816 02/24/19 0407  Weight: 77.1 kg 76.2 kg   General: looks well Heart: NSR in 80s on telemetry monitor. Chest: Clear bilaterally.  No labored breathing or cough. Abdomen: Tender, non-distended.  Soft.  Active bowel sounds. Extremities: No CCE. Neuro/Psych: Fully alert and oriented x3.  No tremors, no weakness.  Intake/Output from previous day: 03/21 0701 - 03/22 0700 In: 480 [P.O.:480] Out: -   Intake/Output this shift: Total I/O In: 1426 [P.O.:480; I.V.:946] Out: 400 [Urine:400]  Lab Results: Recent Labs    02/23/19 1801  02/24/19 1540 02/25/19 0413 02/26/19 0451  WBC 10.7*  --   --  10.3 10.2  HGB 8.0*   < > 8.1* 7.2* 7.4*  HCT 24.4*   < > 24.8* 21.8* 22.8*  PLT 254  --   --  246 278   < > = values in this interval not displayed.   BMET Recent Labs    02/23/19 1801 02/24/19 0601 02/25/19 0413  NA 135 139 138  K 4.4 3.8 3.6  CL 100 105 102  CO2 25 27 26   GLUCOSE 299* 159* 143*  BUN 42* 30* 16  CREATININE 1.04 0.90 0.83  CALCIUM 9.6 8.6* 8.4*   LFT Recent Labs    02/23/19 1801  PROT 5.7*  ALBUMIN 3.4*  AST 40  ALT 49*  ALKPHOS 41  BILITOT 0.5   PT/INR Recent Labs    02/25/19 0413 02/26/19 0451  LABPROT 40.5* 29.6*  INR 4.3* 2.9*     ASSESMENT:   *UpperGIBwith  Melena.Day 2 IV Protonix, finishes today at 1800 Gastritis on EGD in 2017. Diverticulosis, hemorrhoids, adenomatous and sessile serrated polyps on Colonoscopy  * Blood loss anemia. S/p 1 U PRBCs Hgb 8 >> 7.2 >> 7.4. Remote history of ITP around 2003. Ultimately underwent splenectomy.  * Iatrogenic coagulopathy. Chronic Coumadin s/p mechanical Ao valve. Not clear why his INR shot up as there have been no recent changes to his medications and no recent antibiotics. Received 5 mg vitamin K p.o. on 3/19 INR 7.5 >> 2.9.    * Hx CVA in setting of subtherapeutic INR.  For this reason no plans to aggressively correct INR    PLAN   *   EGD tomorrow if INR 2 or less.    *   Begin oral Protonix this PM.  CBC and PT/INR in AM   Azucena Freed  02/26/2019, 2:36 PM Phone [phone]    Attending physician's note   I have taken an interval history, reviewed the chart and examined the patient. I agree with the Advanced Practitioner's note, impression and recommendations.   73 year old with UGI bleed (in setting of supratherapeutic INR), AVR on Coumadin. S/P 1U PRBC. Hb 7.4 today. INR 2.9.  No active bleeding. Plan: Trend CBC, INR.  Protonix, EGD once INR <2.0 (likely tomorrow).  Carmell Austria, MD

## 2019-02-27 ENCOUNTER — Inpatient Hospital Stay (HOSPITAL_COMMUNITY): Payer: Medicare Other | Admitting: Anesthesiology

## 2019-02-27 ENCOUNTER — Encounter (HOSPITAL_COMMUNITY): Payer: Self-pay | Admitting: *Deleted

## 2019-02-27 ENCOUNTER — Encounter (HOSPITAL_COMMUNITY)
Admission: EM | Disposition: A | Discharge status: Home / Self Care | Payer: Self-pay | Source: Home / Self Care | Attending: Internal Medicine

## 2019-02-27 DIAGNOSIS — K259 Gastric ulcer, unspecified as acute or chronic, without hemorrhage or perforation: Secondary | ICD-10-CM

## 2019-02-27 HISTORY — PX: ESOPHAGOGASTRODUODENOSCOPY (EGD) WITH PROPOFOL: SHX5813

## 2019-02-27 LAB — PROTIME-INR
INR: 1.8 — ABNORMAL HIGH (ref 0.8–1.2)
Prothrombin Time: 20.3 seconds — ABNORMAL HIGH (ref 11.4–15.2)

## 2019-02-27 LAB — CBC
HCT: 24.8 % — ABNORMAL LOW (ref 39.0–52.0)
Hemoglobin: 7.7 g/dL — ABNORMAL LOW (ref 13.0–17.0)
MCH: 30.9 pg (ref 26.0–34.0)
MCHC: 31 g/dL (ref 30.0–36.0)
MCV: 99.6 fL (ref 80.0–100.0)
Platelets: 309 10*3/uL (ref 150–400)
RBC: 2.49 MIL/uL — ABNORMAL LOW (ref 4.22–5.81)
RDW: 18.6 % — ABNORMAL HIGH (ref 11.5–15.5)
WBC: 8.6 10*3/uL (ref 4.0–10.5)
nRBC: 2.5 % — ABNORMAL HIGH (ref 0.0–0.2)

## 2019-02-27 LAB — GLUCOSE, CAPILLARY
GLUCOSE-CAPILLARY: 202 mg/dL — AB (ref 70–99)
Glucose-Capillary: 103 mg/dL — ABNORMAL HIGH (ref 70–99)
Glucose-Capillary: 122 mg/dL — ABNORMAL HIGH (ref 70–99)
Glucose-Capillary: 140 mg/dL — ABNORMAL HIGH (ref 70–99)
Glucose-Capillary: 166 mg/dL — ABNORMAL HIGH (ref 70–99)
Glucose-Capillary: 224 mg/dL — ABNORMAL HIGH (ref 70–99)

## 2019-02-27 LAB — HEMOGLOBIN AND HEMATOCRIT, BLOOD
HCT: 23.7 % — ABNORMAL LOW (ref 39.0–52.0)
Hemoglobin: 7.5 g/dL — ABNORMAL LOW (ref 13.0–17.0)

## 2019-02-27 LAB — HEPARIN LEVEL (UNFRACTIONATED): Heparin Unfractionated: <0.1 IU/mL — ABNORMAL LOW (ref 0.30–0.70)

## 2019-02-27 SURGERY — ESOPHAGOGASTRODUODENOSCOPY (EGD) WITH PROPOFOL
Anesthesia: General

## 2019-02-27 MED ORDER — INSULIN ASPART 100 UNIT/ML ~~LOC~~ SOLN
0.0000 [IU] | Freq: Three times a day (TID) | SUBCUTANEOUS | Status: DC
  Administered 2019-02-27 (×2): 3 [IU] via SUBCUTANEOUS
  Administered 2019-02-28: 2 [IU] via SUBCUTANEOUS
  Administered 2019-02-28: 3 [IU] via SUBCUTANEOUS
  Administered 2019-02-28: 5 [IU] via SUBCUTANEOUS
  Administered 2019-02-28: 2 [IU] via SUBCUTANEOUS

## 2019-02-27 MED ORDER — LIDOCAINE 2% (20 MG/ML) 5 ML SYRINGE
INTRAMUSCULAR | Status: DC | PRN
  Administered 2019-02-27: 50 mg via INTRAVENOUS

## 2019-02-27 MED ORDER — PHENYLEPHRINE 40 MCG/ML (10ML) SYRINGE FOR IV PUSH (FOR BLOOD PRESSURE SUPPORT)
PREFILLED_SYRINGE | INTRAVENOUS | Status: DC | PRN
  Administered 2019-02-27: 80 ug via INTRAVENOUS

## 2019-02-27 MED ORDER — LACTATED RINGERS IV SOLN
INTRAVENOUS | Status: DC
  Administered 2019-02-27 (×2): via INTRAVENOUS

## 2019-02-27 MED ORDER — PROPOFOL 10 MG/ML IV BOLUS
INTRAVENOUS | Status: DC | PRN
  Administered 2019-02-27: 150 mg via INTRAVENOUS

## 2019-02-27 MED ORDER — ONDANSETRON HCL 4 MG/2ML IJ SOLN
INTRAMUSCULAR | Status: DC | PRN
  Administered 2019-02-27: 4 mg via INTRAVENOUS

## 2019-02-27 MED ORDER — SUCCINYLCHOLINE CHLORIDE 20 MG/ML IJ SOLN
INTRAMUSCULAR | Status: DC | PRN
  Administered 2019-02-27: 100 mg via INTRAVENOUS

## 2019-02-27 MED ORDER — HEPARIN (PORCINE) 25000 UT/250ML-% IV SOLN
1300.0000 [IU]/h | INTRAVENOUS | Status: AC
  Administered 2019-02-27: 1000 [IU]/h via INTRAVENOUS
  Administered 2019-02-28: 1350 [IU]/h via INTRAVENOUS
  Administered 2019-03-01: 1300 [IU]/h via INTRAVENOUS
  Filled 2019-02-27 (×3): qty 250

## 2019-02-27 SURGICAL SUPPLY — 15 items

## 2019-02-27 NOTE — Op Note (Signed)
[facility] Patient Name: [name] [name] Procedure Date : 02/27/2019 MRN: [id number] Attending MD: Estill Cotta. Loletha Carrow , MD Date of Birth: 1946/07/11 CSN: [id number] Age: 73 Admit Type: Inpatient Procedure:                Upper GI endoscopy Indications:              Acute post hemorrhagic anemia, Melena (coumadin for                            mechanical AVR, admission INR >7) Providers:                Estill Cotta. Loletha Carrow, MD, Burtis Junes, RN, Cherylynn Ridges,                            Technician Referring MD:             [facility] Medicines:                General Anesthesia Complications:            No immediate complications. Estimated Blood Loss:     Estimated blood loss: none. Procedure:                Pre-Anesthesia Assessment:                           - Prior to the procedure, a History and Physical                            was performed, and patient medications and                            allergies were reviewed. The patient's tolerance of                            previous anesthesia was also reviewed. The risks                            and benefits of the procedure and the sedation                            options and risks were discussed with the patient.                            All questions were answered, and informed consent                            was obtained. Prior Anticoagulants: The patient has                            taken Coumadin (warfarin), last dose was 4 days                            prior to procedure. ASA Grade Assessment: III - A  patient with severe systemic disease. After                            reviewing the risks and benefits, the patient was                            deemed in satisfactory condition to undergo the                            procedure.                           After obtaining informed consent, the endoscope was                            passed under direct vision. Throughout  the                            procedure, the patient's blood pressure, pulse, and                            oxygen saturations were monitored continuously. The                            GIF-H190 ([id number] Olympus gastroscope was                            introduced through the mouth, and advanced to the                            third part of duodenum. The upper GI endoscopy was                            accomplished without difficulty. The patient                            tolerated the procedure well. Scope In: Scope Out: Findings:      The esophagus was normal.      Two non-bleeding superficial gastric ulcers with no stigmata of bleeding       were found in the prepyloric region of the stomach. The largest lesion       was 6 mm in largest dimension. There was evidence of two small, healed       ulcers as well.      The exam of the stomach was otherwise normal, including on retroflexion.      The examined duodenum was normal. Impression:               - Normal esophagus.                           - Non-bleeding gastric ulcers with no stigmata of                            bleeding.                           -  Normal examined duodenum.                           - No specimens collected. Recommendation:           - Return patient to hospital ward for ongoing care.                           - Resume regular diet.                           - Use Protonix (pantoprazole) 40 mg PO BID.                           - Fecal H. pylori antigen ordered. (gastric biopsy                            not taken because INR 1.8 today and patient must                            resume anticoagulation immediately due to                            mechanical valve.                           - Hemoglobin/hematocrit x 12 hrs x 2, then further                            lab draws depending on clinical progress.                           Start unfractionated heparin today. If no                             recurrence of overt GI bleeding with melena and                            dropping hemoglobin tomorrow, then resume coumadin                            tomorrow. Procedure Code(s):        --- Professional ---                           [phone], Esophagogastroduodenoscopy, flexible,                            transoral; diagnostic, including collection of                            specimen(s) by brushing or washing, when performed                            (separate procedure) Diagnosis Code(s):        --- Professional ---  K25.9, Gastric ulcer, unspecified as acute or                            chronic, without hemorrhage or perforation                           D62, Acute posthemorrhagic anemia                           K92.1, Melena (includes Hematochezia) CPT copyright 2018 American [employer] Association. All rights reserved. The codes documented in this report are preliminary and upon coder review may  be revised to meet current compliance requirements. Nikolaos Maddocks L. Loletha Carrow, MD 02/27/2019 11:23:32 AM This report has been signed electronically. Number of Addenda: 0

## 2019-02-27 NOTE — Interval H&P Note (Signed)
History and Physical Interval Note:  02/27/2019 10:39 AM  [name] [name]  has presented today for surgery, with the diagnosis of Melena, blood loss anemia, GI bleed in patient with supratherapeutic INR, Coumadin on hold..  The various methods of treatment have been discussed with the patient and family. After consideration of risks, benefits and other options for treatment, the patient has consented to  Procedure(s): ESOPHAGOGASTRODUODENOSCOPY (EGD) WITH PROPOFOL (N/A) as a surgical intervention.  The patient's history has been reviewed, patient examined, no change in status, stable for surgery.  I have reviewed the patient's chart and labs.  Questions were answered to the patient's satisfaction.    INR 1.8 today, Hgb stable at 7.7  [name]

## 2019-02-27 NOTE — Progress Notes (Signed)
ANTICOAGULATION CONSULT NOTE - Follow Up Consult  Pharmacy Consult for Heparin Indication: mechanical valve  No Known Allergies  Patient Measurements: Height: 5\' 8"  (172.7 cm) Weight: 167 lb 15.9 oz (76.2 kg) IBW/kg (Calculated) : 68.4 Heparin Dosing Weight: 76 kg  Vital Signs: Temp: 98.9 F (37.2 C) (03/23 2145) Temp Source: Oral (03/23 2145) BP: 120/36 (03/23 2145) Pulse Rate: 79 (03/23 2145)  Labs: Recent Labs    02/25/19 0413 02/26/19 0451 02/27/19 0445 02/27/19 1707 02/27/19 2044  HGB 7.2* 7.4* 7.7* 7.5*  --   HCT 21.8* 22.8* 24.8* 23.7*  --   PLT 246 278 309  --   --   LABPROT 40.5* 29.6* 20.3*  --   --   INR 4.3* 2.9* 1.8*  --   --   HEPARINUNFRC  --   --   --   --  <0.10*  CREATININE 0.83  --   --   --   --     Estimated Creatinine Clearance: 76.7 mL/min (by C-G formula based on SCr of 0.83 mg/dL).  Assessment:  73 yo male presented with upper GI bleeding with acute blood loss anemia. PTA warfarin for mechanical aortic valve replacement. INR was supratherapeutic on admission 3/19 at 7.5 and Vitamin K 5 mg PO was given. Coumadin remains on hold.  INR 1.8 today. Hgb 7.4.    Anticoagulation resumed today with IV heparin. No boluses due to recent bleeding.   Initial heparin level is undetectable (<0.10) on 1000 units/hr.   No known infusion issues.  No known bleeding. Hgb 7.5 this afternoon, low stable.  Goal of Therapy:  Heparin level 0.3-0.7 units/ml Monitor platelets by anticoagulation protocol: Yes   Plan:   Increase heparin drip to 1200 units/hr (~3 units/kg/hr)  Next heparin level and CBC with am labs.  Monitor for s/sx bleeding.  Coumadin on hold.  Arty Baumgartner, [place] Pager: [phone] or phone: [phone] 02/27/2019,9:56 PM

## 2019-02-27 NOTE — Progress Notes (Signed)
PROGRESS NOTE        PATIENT DETAILS Name: [name] [name] Age: 73 y.o. Sex: male Date of Birth: 18-Apr-1946 Admit Date: 02/23/2019 Admitting Physician Etta Quill, DO UYQ:IHKVQ, Jenny Reichmann, MD  Brief Narrative: Patient is a 73 y.o. male with history of mechanical aortic valve replacement in 2004, CAD status post CABG in 2004, PAD-presented with upper GI bleeding with acute blood loss anemia in the setting of supratherapeutic INR.  See below for further details.  Subjective: No hematochezia since 1 day prior to this hospitalization.  Denies any chest pain or shortness of breath.  Assessment/Plan: Upper GI bleeding with acute blood loss anemia: Melena for close to 5 days-underwent EGD on 3/23-no obvious bleeding foci-hemoglobin stable 7.7.  GI recommending initiating IV heparin today-if no bleeding-suspect we could transition to Lovenox/Coumadin tomorrow and discharge home.  Continue PPI  Supratherapeutic INR: Given vitamin K on day of admission-INR slowly down trended-currently <2-starting IV heparin-see above.  DM-2: CBGs stable-continue SSI  Hypertension: Blood pressure stable-continue amlodipine  History of mechanical valve replacement secondary to aortic stenosis due to bicuspid aortic valve: See above regarding plans to resume anticoagulation  History of CAD s/p CABG and PAD: Stable-anticoagulation on hold  DVT Prophylaxis: SCD's  Code Status: Full code   Family Communication: None at bedside  Disposition Plan: Remain inpatient  Antimicrobial agents: Anti-infectives (From admission, onward)   None      Procedures: None  CONSULTS:  cardiology and GI  Time spent: 25- minutes-Greater than 50% of this time was spent in counseling, explanation of diagnosis, planning of further management, and coordination of care.  MEDICATIONS: Scheduled Meds: . amLODipine  5 mg Oral Daily  . atorvastatin  80 mg Oral Daily  . insulin aspart  0-9  Units Subcutaneous Q4H  . omega-3 acid ethyl esters  1 g Oral BID  . pantoprazole  40 mg Oral BID   Continuous Infusions: . heparin     PRN Meds:.acetaminophen **OR** acetaminophen, ondansetron **OR** ondansetron (ZOFRAN) IV   PHYSICAL EXAM: Vital signs: Vitals:   02/27/19 0953 02/27/19 1119 02/27/19 1130 02/27/19 1145  BP: (!) 181/33 (!) 159/49 (!) 164/42 (!) 120/51  Pulse: 84 88 91 79  Resp: 15 15 (!) 22 (!) 26  Temp: 98.2 F (36.8 C) 98.6 F (37 C)    TempSrc: Oral Oral    SpO2: 98% 98% 95% 96%  Weight:      Height:       Filed Weights   02/23/19 1816 02/24/19 0407  Weight: 77.1 kg 76.2 kg   Body mass index is 25.54 kg/m.   General appearance:Awake, alert, not in any distress.  Eyes:no scleral icterus. HEENT: Atraumatic and Normocephalic Neck: supple, no JVD. Resp:Good air entry bilaterally,no rales or rhonchi CVS: S1 S2 regular, + metallic click  GI: Bowel sounds present, Non tender and not distended with no gaurding, rigidity or rebound. Extremities: B/L Lower Ext shows no edema, both legs are warm to touch Neurology:  Non focal Psychiatric: Normal judgment and insight. Normal mood. Musculoskeletal:No digital cyanosis Skin:No Rash, warm and dry Wounds:N/A  I have personally reviewed following labs and imaging studies  LABORATORY DATA: CBC: Recent Labs  Lab 02/23/19 1801  02/24/19 0601 02/24/19 1540 02/25/19 0413 02/26/19 0451 02/27/19 0445  WBC 10.7*  --   --   --  10.3 10.2 8.6  NEUTROABS  6.1  --   --   --   --   --   --   HGB 8.0*   < > 7.8* 8.1* 7.2* 7.4* 7.7*  HCT 24.4*   < > 23.6* 24.8* 21.8* 22.8* 24.8*  MCV 96.4  --   --   --  97.3 99.6 99.6  PLT 254  --   --   --  246 278 309   < > = values in this interval not displayed.    Basic Metabolic Panel: Recent Labs  Lab 02/23/19 1801 02/24/19 0601 02/25/19 0413  NA 135 139 138  K 4.4 3.8 3.6  CL 100 105 102  CO2 25 27 26   GLUCOSE 299* 159* 143*  BUN 42* 30* 16  CREATININE 1.04 0.90  0.83  CALCIUM 9.6 8.6* 8.4*    GFR: Estimated Creatinine Clearance: 76.7 mL/min (by C-G formula based on SCr of 0.83 mg/dL).  Liver Function Tests: Recent Labs  Lab 02/23/19 1801  AST 40  ALT 49*  ALKPHOS 41  BILITOT 0.5  PROT 5.7*  ALBUMIN 3.4*   Recent Labs  Lab 02/23/19 1801  LIPASE 32   No results for input(s): AMMONIA in the last 168 hours.  Coagulation Profile: Recent Labs  Lab 02/24/19 0601 02/24/19 1121 02/25/19 0413 02/26/19 0451 02/27/19 0445  INR 6.4* 6.4* 4.3* 2.9* 1.8*    Cardiac Enzymes: Recent Labs  Lab 02/23/19 1801  TROPONINI <0.03    BNP (last 3 results) No results for input(s): PROBNP in the last 8760 hours.  HbA1C: No results for input(s): HGBA1C in the last 72 hours.  CBG: Recent Labs  Lab 02/26/19 1716 02/26/19 2024 02/27/19 0020 02/27/19 0408 02/27/19 0810  GLUCAP 116* 210* 122* 103* 166*    Lipid Profile: No results for input(s): CHOL, HDL, LDLCALC, TRIG, CHOLHDL, LDLDIRECT in the last 72 hours.  Thyroid Function Tests: No results for input(s): TSH, T4TOTAL, FREET4, T3FREE, THYROIDAB in the last 72 hours.  Anemia Panel: No results for input(s): VITAMINB12, FOLATE, FERRITIN, TIBC, IRON, RETICCTPCT in the last 72 hours.  Urine analysis:    Component Value Date/Time   COLORURINE STRAW (A) 12/20/2017 1924   APPEARANCEUR CLEAR 12/20/2017 1924   LABSPEC 1.020 12/20/2017 1924   PHURINE 7.0 12/20/2017 1924   GLUCOSEU >=500 (A) 12/20/2017 1924   HGBUR NEGATIVE 12/20/2017 1924   BILIRUBINUR NEGATIVE 12/20/2017 1924   KETONESUR 5 (A) 12/20/2017 1924   PROTEINUR NEGATIVE 12/20/2017 1924   UROBILINOGEN 0.2 01/15/2012 1415   NITRITE NEGATIVE 12/20/2017 1924   LEUKOCYTESUR NEGATIVE 12/20/2017 1924    Sepsis Labs: Lactic Acid, Venous No results found for: LATICACIDVEN  MICROBIOLOGY: Recent Results (from the past 240 hour(s))  MRSA PCR Screening     Status: None   Collection Time: 02/23/19 11:05 PM  Result Value Ref  Range Status   MRSA by PCR NEGATIVE NEGATIVE Final    Comment:        The GeneXpert MRSA Assay (FDA approved for NASAL specimens only), is one component of a comprehensive MRSA colonization surveillance program. It is not intended to diagnose MRSA infection nor to guide or monitor treatment for MRSA infections. Performed at [facility], [place] [address], [place], [place] [address]     RADIOLOGY STUDIES/RESULTS: Dg Chest Port 1 View  Result Date: 02/23/2019 CLINICAL DATA:  Shortness of breath EXAM: PORTABLE CHEST 1 VIEW COMPARISON:  12/20/2017 FINDINGS: The heart size and mediastinal contours are within normal limits. Both lungs are clear. Remote  median sternotomy. The visualized skeletal structures are unremarkable. IMPRESSION: No active disease. Electronically Signed   By: Ulyses Jarred M.D.   On: 02/23/2019 18:59     LOS: 4 days   Oren Binet, MD  [facility]  If 7PM-7AM, please contact night-coverage  Please page via [email or web address]  Go to [email or web address] and use [place]'s universal password to access. If you do not have the password, please contact the hospital operator.  Locate the [facility] provider you are looking for under [facility] and page to a number that you can be directly reached. If you still have difficulty reaching the provider, please page the [facility] (Director on Call) for the Hospitalists listed on amion for assistance.  02/27/2019, 12:45 PM

## 2019-02-27 NOTE — Progress Notes (Signed)
GI signing off case but unable to make an office appt at this time (COVID 19). We will contact patient to arrange for phone or telehealth visit within next couple of weeks.

## 2019-02-27 NOTE — Anesthesia Preprocedure Evaluation (Signed)
Anesthesia Evaluation  Patient identified by MRN, date of birth, ID band Patient awake    Reviewed: Allergy & Precautions, H&P , NPO status , Patient's Chart, lab work & pertinent test results  Airway Mallampati: II   Neck ROM: full    Dental   Pulmonary former smoker,    breath sounds clear to auscultation       Cardiovascular hypertension, + CAD and + Peripheral Vascular Disease  + Valvular Problems/Murmurs  Rhythm:regular Rate:Normal  S/p AVR ([employer] Jude)   Neuro/Psych TIACVA    GI/Hepatic GI bleed   Endo/Other  diabetes, Type 2  Renal/GU      Musculoskeletal   Abdominal   Peds  Hematology  (+) Blood dyscrasia, anemia ,   Anesthesia Other Findings   Reproductive/Obstetrics                             Anesthesia Physical Anesthesia Plan  ASA: III  Anesthesia Plan: General   Post-op Pain Management:    Induction: Intravenous  PONV Risk Score and Plan: 2 and Ondansetron, Dexamethasone and Treatment may vary due to age or medical condition  Airway Management Planned: Oral ETT  Additional Equipment:   Intra-op Plan:   Post-operative Plan: Extubation in OR  Informed Consent: I have reviewed the patients History and Physical, chart, labs and discussed the procedure including the risks, benefits and alternatives for the proposed anesthesia with the patient or authorized representative who has indicated his/her understanding and acceptance.       Plan Discussed with: CRNA, Anesthesiologist and Surgeon  Anesthesia Plan Comments:         Anesthesia Quick Evaluation

## 2019-02-27 NOTE — Progress Notes (Signed)
ANTICOAGULATION CONSULT NOTE - Initial Consult  Pharmacy Consult for heparin dosing Indication: Mechanical Valve  No Known Allergies  Patient Measurements: Height: 5\' 8"  (172.7 cm) Weight: 167 lb 15.9 oz (76.2 kg) IBW/kg (Calculated) : 68.4 Heparin Dosing Weight: 76.2 kg  Vital Signs: Temp: 98.6 F (37 C) (03/23 1119) Temp Source: Oral (03/23 1119) BP: 120/51 (03/23 1145) Pulse Rate: 79 (03/23 1145)  Labs: Recent Labs    02/25/19 0413 02/26/19 0451 02/27/19 0445  HGB 7.2* 7.4* 7.7*  HCT 21.8* 22.8* 24.8*  PLT 246 278 309  LABPROT 40.5* 29.6* 20.3*  INR 4.3* 2.9* 1.8*  CREATININE 0.83  --   --     Estimated Creatinine Clearance: 76.7 mL/min (by C-G formula based on SCr of 0.83 mg/dL).   Medical History: Past Medical History:  Diagnosis Date  . Adenomatous colon polyp 09/1997  . Anemia   . Aortic stenosis    s/p st. jude mechanical AVR - Chronic Coumadin  . Blood transfusion    "related to ITP"  . Coronary artery disease    s/p cabg x 3 11/2003: lima-lad, seq vg to rpda and rpl  . Diverticulitis of colon   . Heart murmur   . Hyperlipidemia   . Hypertension   . ITP (idiopathic thrombocytopenic purpura)   . Peripheral arterial disease ([place])    a. history of aortobifemoral bypass grafting by Dr. Sherren Mocha early b. LE angiography 04/22/2015 patent aortobifem graft, DES to R SFA  . Peripheral vascular disease ([place])    s/p Left external Iliac Artery stenting and subsequent left femoral endarterectomy 02/2011- post op course complicated by wound infxn req I&D 03/2011  . Renal artery stenosis, native, bilateral ([facility])    a. bilateral renal artery stenosis by recent duplex ultrasound b. L renal artery stent 02/2015, R renal artery patent on angiogram  . Stroke ([place])   . TIA (transient ischemic attack) ~ 2013  . Type II diabetes mellitus [facility])    Assessment: 73 yo male presented with upper GI bleeding with acute blood loss anemia. PTA warfarin for mechanical aortic valve  replacement. INR was supratherapeutic on admission at 7.5 and  Vitamin K was administered. Switching from warfarin to heparin due to GI bleed. Hgb 7.4, PLTs WNL.  Goal of Therapy:  Heparin level 0.3-0.7 units/ml Monitor platelets by anticoagulation protocol: Yes   Plan:  Start heparin infusion at 1000 unit/hr - conservative due to GI bleed. Check 8 hour HL  Monitor HL, CBC, and for s/sx of bleeding daily  Thanks, Lavena Bullion, PharmD Candidate 02/27/2019,12:29 PM

## 2019-02-27 NOTE — Transfer of Care (Signed)
Immediate Anesthesia Transfer of Care Note  Patient: [name] [name]  Procedure(s) Performed: ESOPHAGOGASTRODUODENOSCOPY (EGD) WITH PROPOFOL (N/A )  Patient Location: Endoscopy Unit  Anesthesia Type:General  Level of Consciousness: drowsy  Airway & Oxygen Therapy: Patient Spontanous Breathing and Patient connected to face mask oxygen  Post-op Assessment: Report given to RN and Post -op Vital signs reviewed and stable  Post vital signs: Reviewed and stable  Last Vitals:  Vitals Value Taken Time  BP 156/138 02/27/2019 11:19 AM  Temp    Pulse 82 02/27/2019 11:20 AM  Resp 29 02/27/2019 11:20 AM  SpO2 98 % 02/27/2019 11:20 AM  Vitals shown include unvalidated device data.  Last Pain:  Vitals:   02/27/19 1119  TempSrc: Oral  PainSc: 0-No pain         Complications: No apparent anesthesia complications

## 2019-02-27 NOTE — Anesthesia Procedure Notes (Signed)
Procedure Name: Intubation Date/Time: 02/27/2019 10:54 AM Performed by: Lieutenant Diego, CRNA Pre-anesthesia Checklist: Patient identified, Emergency Drugs available, Suction available and Patient being monitored Patient Re-evaluated:Patient Re-evaluated prior to induction Oxygen Delivery Method: Circle system utilized Preoxygenation: Pre-oxygenation with 100% oxygen Induction Type: IV induction Ventilation: Mask ventilation without difficulty Laryngoscope Size: Glidescope Grade View: Grade I Tube type: Oral Tube size: 7.5 mm Number of attempts: 1 Airway Equipment and Method: Stylet and Video-laryngoscopy Placement Confirmation: ETT inserted through vocal cords under direct vision,  positive ETCO2 and breath sounds checked- equal and bilateral Secured at: 22 cm Tube secured with: Tape Dental Injury: Teeth and Oropharynx as per pre-operative assessment  Comments: Elective glide

## 2019-02-28 ENCOUNTER — Telehealth: Payer: Self-pay

## 2019-02-28 LAB — CBC
HCT: 22.8 % — ABNORMAL LOW (ref 39.0–52.0)
Hemoglobin: 7.1 g/dL — ABNORMAL LOW (ref 13.0–17.0)
MCH: 32 pg (ref 26.0–34.0)
MCHC: 31.1 g/dL (ref 30.0–36.0)
MCV: 102.7 fL — ABNORMAL HIGH (ref 80.0–100.0)
NRBC: 1.1 % — AB (ref 0.0–0.2)
Platelets: 312 10*3/uL (ref 150–400)
RBC: 2.22 MIL/uL — ABNORMAL LOW (ref 4.22–5.81)
RDW: 18.6 % — AB (ref 11.5–15.5)
WBC: 10.6 10*3/uL — ABNORMAL HIGH (ref 4.0–10.5)

## 2019-02-28 LAB — PROTIME-INR
INR: 1.2 (ref 0.8–1.2)
Prothrombin Time: 15.4 seconds — ABNORMAL HIGH (ref 11.4–15.2)

## 2019-02-28 LAB — GLUCOSE, CAPILLARY
Glucose-Capillary: 154 mg/dL — ABNORMAL HIGH (ref 70–99)
Glucose-Capillary: 192 mg/dL — ABNORMAL HIGH (ref 70–99)
Glucose-Capillary: 281 mg/dL — ABNORMAL HIGH (ref 70–99)
Glucose-Capillary: 210 mg/dL — ABNORMAL HIGH (ref 70–99)

## 2019-02-28 LAB — HEMOGLOBIN AND HEMATOCRIT, BLOOD
HCT: 28.3 % — ABNORMAL LOW (ref 39.0–52.0)
Hemoglobin: 8.8 g/dL — ABNORMAL LOW (ref 13.0–17.0)

## 2019-02-28 LAB — HEPARIN LEVEL (UNFRACTIONATED)
Heparin Unfractionated: 0.22 IU/mL — ABNORMAL LOW (ref 0.30–0.70)
Heparin Unfractionated: 0.56 IU/mL (ref 0.30–0.70)

## 2019-02-28 LAB — PREPARE RBC (CROSSMATCH)

## 2019-02-28 MED ORDER — FUROSEMIDE 10 MG/ML IJ SOLN
20.0000 mg | Freq: Once | INTRAMUSCULAR | Status: AC
  Administered 2019-02-28: 20 mg via INTRAVENOUS
  Filled 2019-02-28: qty 2

## 2019-02-28 MED ORDER — WARFARIN - PHARMACIST DOSING INPATIENT: Freq: Every day | Status: DC

## 2019-02-28 MED ORDER — WARFARIN SODIUM 5 MG PO TABS
10.0000 mg | ORAL_TABLET | Freq: Once | ORAL | Status: AC
  Administered 2019-02-28: 10 mg via ORAL
  Filled 2019-02-28: qty 2

## 2019-02-28 MED ORDER — ACETAMINOPHEN 325 MG PO TABS
650.0000 mg | ORAL_TABLET | Freq: Once | ORAL | Status: AC
  Administered 2019-02-28: 650 mg via ORAL
  Filled 2019-02-28: qty 2

## 2019-02-28 MED ORDER — SODIUM CHLORIDE 0.9% IV SOLUTION
Freq: Once | INTRAVENOUS | Status: AC
  Administered 2019-02-28: 12:00:00 via INTRAVENOUS

## 2019-02-28 MED ORDER — DIPHENHYDRAMINE HCL 50 MG/ML IJ SOLN
25.0000 mg | Freq: Once | INTRAMUSCULAR | Status: AC
  Administered 2019-02-28: 25 mg via INTRAVENOUS
  Filled 2019-02-28: qty 1

## 2019-02-28 NOTE — Progress Notes (Signed)
ANTICOAGULATION CONSULT NOTE - Follow Up Consult  Pharmacy Consult for Heparin Indication: mechanical valve  No Known Allergies  Patient Measurements: Height: 5\' 8"  (172.7 cm) Weight: 167 lb 15.9 oz (76.2 kg) IBW/kg (Calculated) : 68.4 Heparin Dosing Weight: 76 kg  Vital Signs: Temp: 99.3 F (37.4 C) (03/24 0458) Temp Source: Oral (03/24 0458) BP: 110/52 (03/24 0458) Pulse Rate: 92 (03/24 0458)  Labs: Recent Labs    02/26/19 0451 02/27/19 0445 02/27/19 1707 02/27/19 2044 02/28/19 0554  HGB 7.4* 7.7* 7.5*  --  7.1*  HCT 22.8* 24.8* 23.7*  --  22.8*  PLT 278 309  --   --  312  LABPROT 29.6* 20.3*  --   --   --   INR 2.9* 1.8*  --   --   --   HEPARINUNFRC  --   --   --  <0.10* 0.22*    Estimated Creatinine Clearance: 76.7 mL/min (by C-G formula based on SCr of 0.83 mg/dL).  Assessment:  73 yo male presented with upper GI bleeding with acute blood loss anemia. PTA warfarin for mechanical aortic valve replacement. INR was supratherapeutic on admission 3/19 at 7.5 and Vitamin K 5 mg PO was given. Coumadin remains on hold.  INR 1.8 today. Hgb 7.4.    Anticoagulation resumed with IV heparin. No boluses due to recent bleeding. Heparin level this am 0.22 units/ml.  No bleeding noted  Goal of Therapy:  Heparin level 0.3-0.7 units/ml Monitor platelets by anticoagulation protocol: Yes   Plan:   Increase heparin drip to 1350 units/hr  Check heparin level later today  Monitor for s/sx bleeding.  Coumadin on hold.  Thanks for allowing pharmacy to be a part of this patient's care.  Excell Seltzer, PharmD Clinical Pharmacist

## 2019-02-28 NOTE — Progress Notes (Signed)
ANTICOAGULATION CONSULT NOTE - Follow Up Consult  Pharmacy Consult for Heparin and Coumadin Indication: mechanical valve  No Known Allergies  Patient Measurements: Height: 5\' 8"  (172.7 cm) Weight: 167 lb 15.9 oz (76.2 kg) IBW/kg (Calculated) : 68.4 Heparin Dosing Weight: 76 kg  Vital Signs: Temp: 97.7 F (36.5 C) (03/24 1605) Temp Source: Oral (03/24 1605) BP: 117/99 (03/24 1605) Pulse Rate: 82 (03/24 1605)  Labs: Recent Labs    02/26/19 0451 02/27/19 0445 02/27/19 1707 02/27/19 2044 02/28/19 0554 02/28/19 0719 02/28/19 1829 02/28/19 1840  HGB 7.4* 7.7* 7.5*  --  7.1*  --   --  8.8*  HCT 22.8* 24.8* 23.7*  --  22.8*  --   --  28.3*  PLT 278 309  --   --  312  --   --   --   LABPROT 29.6* 20.3*  --   --   --  15.4*  --   --   INR 2.9* 1.8*  --   --   --  1.2  --   --   HEPARINUNFRC  --   --   --  <0.10* 0.22*  --  0.56  --     Estimated Creatinine Clearance: 76.7 mL/min (by C-G formula based on SCr of 0.83 mg/dL).  Assessment:  73 yo male presented with upper GI bleeding with acute blood loss anemia. PTA warfarin for mechanical aortic valve replacement. INR was supratherapeutic on admission 3/19 at 7.5 and Vitamin K 5 mg PO was given.  INR down to 1.2 today    Anticoagulation resumed with IV heparin on 3/23. No boluses due to recent bleeding. Coumadin resumed tonight.  Hgb down to 7.1 this am and 1 unit of PRBCs given. Hgb up to 8.8 post-transfusion.      Heparin level tonight is 0.56 on 1350 units/hr.  Therapeutic, but prefer to keep level in 0.3-0.5 range with recent bleeding.       PTA Coumadin regimen:  10 mg daily except 5 mg on Fridays.  Goal of Therapy:  Heparin level 0.3-0.7 units/ml INR 2.5-3.5 Monitor platelets by anticoagulation protocol: Yes   Plan:   Decrease heparin drip to 1300 units/hr.  Coumadin resumed with 10 mg x 1 tonight.  Next heparin level, PT/INR and CBC with am labs.  Monitor for s/sx bleeding.  Arty Baumgartner, [place] Pager:  [phone] or phone: [phone] 02/28/2019,8:39 PM

## 2019-02-28 NOTE — Progress Notes (Signed)
PROGRESS NOTE        PATIENT DETAILS Name: [name] [name] Age: 73 y.o. Sex: male Date of Birth: 27-Oct-1946 Admit Date: 02/23/2019 Admitting Physician Etta Quill, DO GQB:VQXIH, Jenny Reichmann, MD  Brief Narrative: Patient is a 73 y.o. male with history of mechanical aortic valve replacement in 2004, CAD status post CABG in 2004, PAD-presented with upper GI bleeding with acute blood loss anemia in the setting of supratherapeutic INR.  See below for further details.  Subjective: Complains of exertional dyspnea, palpitations and chest discomfort when he walks-hemoglobin is 7.1 this morning.  He has had no further melena.  Assessment/Plan: Upper GI bleeding with acute blood loss anemia: GI bleeding has resolved-EGD on 3/23 showed no obvious foci of bleeding.  Stable on IV heparin overnight due to mechanical aortic valve with plans to start anticoagulation today.  However patient appears to be very symptomatic with hemoglobin level of 7.1-he clearly needs PRBC transfusion as he is complaining of exertional chest pain/dyspnea-he is used to a hemoglobin of 14/15-current hemoglobin is 7.1.  Supratherapeutic INR: Given vitamin K on day of admission-INR has now normalized.    DM-2: CBGs stable-continue SSI  Hypertension: Blood pressure stable-continue amlodipine  History of mechanical valve replacement secondary to aortic stenosis due to bicuspid aortic valve: Anticoagulation was held-INR was allowed to drift down for EGD-has been restarted on IV heparin as of yesterday-restarting Coumadin today.  History of CAD s/p CABG and PAD: Stable-but having some exertional chest pain/dyspnea-likely secondary to symptomatic anemia-plans are to transfuse 1 unit of PRBC today  DVT Prophylaxis: SCD's  Code Status: Full code   Family Communication: None at bedside  Disposition Plan: Remain inpatient  Antimicrobial agents: Anti-infectives (From admission, onward)   None      Procedures: None  CONSULTS:  cardiology and GI  Time spent: 25- minutes-Greater than 50% of this time was spent in counseling, explanation of diagnosis, planning of further management, and coordination of care.  MEDICATIONS: Scheduled Meds: . sodium chloride   Intravenous Once  . acetaminophen  650 mg Oral Once  . amLODipine  5 mg Oral Daily  . atorvastatin  80 mg Oral Daily  . diphenhydrAMINE  25 mg Intravenous Once  . furosemide  20 mg Intravenous Once  . insulin aspart  0-9 Units Subcutaneous TID AC & HS  . omega-3 acid ethyl esters  1 g Oral BID  . pantoprazole  40 mg Oral BID  . warfarin  10 mg Oral ONCE-1800  . Warfarin - Pharmacist Dosing Inpatient   Does not apply q1800   Continuous Infusions: . heparin 1,350 Units/hr (02/28/19 0934)   PRN Meds:.acetaminophen **OR** acetaminophen, ondansetron **OR** ondansetron (ZOFRAN) IV   PHYSICAL EXAM: Vital signs: Vitals:   02/27/19 1145 02/27/19 1347 02/27/19 2145 02/28/19 0458  BP: (!) 120/51 (!) 121/35 (!) 120/36 (!) 110/52  Pulse: 79 89 79 92  Resp: (!) 26 18    Temp:  99.2 F (37.3 C) 98.9 F (37.2 C) 99.3 F (37.4 C)  TempSrc:  Oral Oral Oral  SpO2: 96% 99% 96% 93%  Weight:      Height:       Filed Weights   02/23/19 1816 02/24/19 0407  Weight: 77.1 kg 76.2 kg   Body mass index is 25.54 kg/m.   General appearance:Awake, alert, not in any distress.  Eyes:no scleral icterus. HEENT:  Atraumatic and Normocephalic Neck: supple, no JVD. Resp:Good air entry bilaterally,no rales or rhonchi CVS: S1 S2 regular, +metallic click GI: Bowel sounds present, Non tender and not distended with no gaurding, rigidity or rebound. Extremities: B/L Lower Ext shows no edema, both legs are warm to touch Neurology:  Non focal Psychiatric: Normal judgment and insight. Normal mood. Musculoskeletal:No digital cyanosis Skin:No Rash, warm and dry Wounds:N/A  I have personally reviewed following labs and imaging studies   LABORATORY DATA: CBC: Recent Labs  Lab 02/23/19 1801  02/25/19 0413 02/26/19 0451 02/27/19 0445 02/27/19 1707 02/28/19 0554  WBC 10.7*  --  10.3 10.2 8.6  --  10.6*  NEUTROABS 6.1  --   --   --   --   --   --   HGB 8.0*   < > 7.2* 7.4* 7.7* 7.5* 7.1*  HCT 24.4*   < > 21.8* 22.8* 24.8* 23.7* 22.8*  MCV 96.4  --  97.3 99.6 99.6  --  102.7*  PLT 254  --  246 278 309  --  312   < > = values in this interval not displayed.    Basic Metabolic Panel: Recent Labs  Lab 02/23/19 1801 02/24/19 0601 02/25/19 0413  NA 135 139 138  K 4.4 3.8 3.6  CL 100 105 102  CO2 25 27 26   GLUCOSE 299* 159* 143*  BUN 42* 30* 16  CREATININE 1.04 0.90 0.83  CALCIUM 9.6 8.6* 8.4*    GFR: Estimated Creatinine Clearance: 76.7 mL/min (by C-G formula based on SCr of 0.83 mg/dL).  Liver Function Tests: Recent Labs  Lab 02/23/19 1801  AST 40  ALT 49*  ALKPHOS 41  BILITOT 0.5  PROT 5.7*  ALBUMIN 3.4*   Recent Labs  Lab 02/23/19 1801  LIPASE 32   No results for input(s): AMMONIA in the last 168 hours.  Coagulation Profile: Recent Labs  Lab 02/24/19 1121 02/25/19 0413 02/26/19 0451 02/27/19 0445 02/28/19 0719  INR 6.4* 4.3* 2.9* 1.8* 1.2    Cardiac Enzymes: Recent Labs  Lab 02/23/19 1801  TROPONINI <0.03    BNP (last 3 results) No results for input(s): PROBNP in the last 8760 hours.  HbA1C: No results for input(s): HGBA1C in the last 72 hours.  CBG: Recent Labs  Lab 02/27/19 0810 02/27/19 1346 02/27/19 1637 02/27/19 2122 02/28/19 0823  GLUCAP 166* 140* 224* 202* 154*    Lipid Profile: No results for input(s): CHOL, HDL, LDLCALC, TRIG, CHOLHDL, LDLDIRECT in the last 72 hours.  Thyroid Function Tests: No results for input(s): TSH, T4TOTAL, FREET4, T3FREE, THYROIDAB in the last 72 hours.  Anemia Panel: No results for input(s): VITAMINB12, FOLATE, FERRITIN, TIBC, IRON, RETICCTPCT in the last 72 hours.  Urine analysis:    Component Value Date/Time    COLORURINE STRAW (A) 12/20/2017 1924   APPEARANCEUR CLEAR 12/20/2017 1924   LABSPEC 1.020 12/20/2017 1924   PHURINE 7.0 12/20/2017 1924   GLUCOSEU >=500 (A) 12/20/2017 1924   HGBUR NEGATIVE 12/20/2017 1924   BILIRUBINUR NEGATIVE 12/20/2017 1924   KETONESUR 5 (A) 12/20/2017 1924   PROTEINUR NEGATIVE 12/20/2017 1924   UROBILINOGEN 0.2 01/15/2012 1415   NITRITE NEGATIVE 12/20/2017 1924   LEUKOCYTESUR NEGATIVE 12/20/2017 1924    Sepsis Labs: Lactic Acid, Venous No results found for: LATICACIDVEN  MICROBIOLOGY: Recent Results (from the past 240 hour(s))  MRSA PCR Screening     Status: None   Collection Time: 02/23/19 11:05 PM  Result Value Ref Range Status   MRSA by  PCR NEGATIVE NEGATIVE Final    Comment:        The GeneXpert MRSA Assay (FDA approved for NASAL specimens only), is one component of a comprehensive MRSA colonization surveillance program. It is not intended to diagnose MRSA infection nor to guide or monitor treatment for MRSA infections. Performed at [facility], [place] [address], [place], [place] [address]     RADIOLOGY STUDIES/RESULTS: Dg Chest Port 1 View  Result Date: 02/23/2019 CLINICAL DATA:  Shortness of breath EXAM: PORTABLE CHEST 1 VIEW COMPARISON:  12/20/2017 FINDINGS: The heart size and mediastinal contours are within normal limits. Both lungs are clear. Remote median sternotomy. The visualized skeletal structures are unremarkable. IMPRESSION: No active disease. Electronically Signed   By: Ulyses Jarred M.D.   On: 02/23/2019 18:59     LOS: 5 days   Oren Binet, MD  [facility]  If 7PM-7AM, please contact night-coverage  Please page via [email or web address]  Go to [email or web address] and use [facility]'s universal password to access. If you do not have the password, please contact the hospital operator.  Locate the [facility] provider you are looking for under [facility] and page to a number that you can be directly reached. If you still have  difficulty reaching the provider, please page the [facility] (Director on Call) for the Hospitalists listed on amion for assistance.  02/28/2019, 10:15 AM

## 2019-02-28 NOTE — Plan of Care (Signed)
  Problem: Education: Goal: Ability to identify signs and symptoms of gastrointestinal bleeding will improve Outcome: Progressing   Problem: Bowel/Gastric: Goal: Will show no signs and symptoms of gastrointestinal bleeding Outcome: Progressing   Problem: Fluid Volume: Goal: Will show no signs and symptoms of excessive bleeding Outcome: Progressing   Problem: Clinical Measurements: Goal: Complications related to the disease process, condition or treatment will be avoided or minimized Outcome: Progressing   Problem: Health Behavior/Discharge Planning: Goal: Ability to manage health-related needs will improve Outcome: Progressing   Problem: Clinical Measurements: Goal: Ability to maintain clinical measurements within normal limits will improve Outcome: Progressing Goal: Diagnostic test results will improve Outcome: Progressing   Problem: Nutrition: Goal: Adequate nutrition will be maintained Outcome: Progressing   Problem: Safety: Goal: Ability to remain free from injury will improve Outcome: Progressing   Problem: Skin Integrity: Goal: Risk for impaired skin integrity will decrease Outcome: Progressing

## 2019-02-28 NOTE — Telephone Encounter (Signed)
CALLED TO PRESCREEN AND MADE AWARE OF DRIVE THRU BUT THE PT WAS [place]

## 2019-02-28 NOTE — Progress Notes (Addendum)
ANTICOAGULATION CONSULT NOTE - Follow Up Consult  Pharmacy Consult for Heparin / Warfarin Indication: mechanical valve  No Known Allergies  Patient Measurements: Height: 5\' 8"  (172.7 cm) Weight: 167 lb 15.9 oz (76.2 kg) IBW/kg (Calculated) : 68.4 Heparin Dosing Weight: 76 kg  Vital Signs: Temp: 99.3 F (37.4 C) (03/24 0458) Temp Source: Oral (03/24 0458) BP: 110/52 (03/24 0458) Pulse Rate: 92 (03/24 0458)  Labs: Recent Labs    02/26/19 0451 02/27/19 0445 02/27/19 1707 02/27/19 2044 02/28/19 0554 02/28/19 0719  HGB 7.4* 7.7* 7.5*  --  7.1*  --   HCT 22.8* 24.8* 23.7*  --  22.8*  --   PLT 278 309  --   --  312  --   LABPROT 29.6* 20.3*  --   --   --  15.4*  INR 2.9* 1.8*  --   --   --  1.2  HEPARINUNFRC  --   --   --  <0.10* 0.22*  --     Estimated Creatinine Clearance: 76.7 mL/min (by C-G formula based on SCr of 0.83 mg/dL).  Assessment:  73 yo male presented with upper GI bleeding with acute blood loss anemia. PTA warfarin for mechanical aortic valve replacement. INR was supratherapeutic on admission 3/19 at 7.5 and Vitamin K 5 mg PO was given. Coumadin remains on hold.  INR 1.2 today. Hgb 7.1.  Plan to restart warfarin, PTA dose 5 mg on Fridays and 10mg  all other days.  Goal of Therapy:  Heparin level 0.3-0.7 units/ml Monitor platelets by anticoagulation protocol: Yes   Plan:  Warfarin 10mg  x1 Continue heparin drip until INR is stable Monitor INR, HL, CBC, and s/sx of bleeding daily  Thanks for allowing pharmacy to be a part of this patient's care.  Lavena Bullion, PharmD Candidate  Agree with above Thank you Anette Guarneri, PharmD [phone]

## 2019-03-01 ENCOUNTER — Encounter (HOSPITAL_COMMUNITY): Payer: Self-pay | Admitting: Gastroenterology

## 2019-03-01 ENCOUNTER — Other Ambulatory Visit: Payer: Self-pay

## 2019-03-01 DIAGNOSIS — K922 Gastrointestinal hemorrhage, unspecified: Secondary | ICD-10-CM

## 2019-03-01 DIAGNOSIS — E1169 Type 2 diabetes mellitus with other specified complication: Secondary | ICD-10-CM

## 2019-03-01 LAB — BPAM RBC
Blood Product Expiration Date: 202004162359
ISSUE DATE / TIME: 202003241249
Unit Type and Rh: 5100

## 2019-03-01 LAB — CBC
HCT: 25.6 % — ABNORMAL LOW (ref 39.0–52.0)
Hemoglobin: 8.2 g/dL — ABNORMAL LOW (ref 13.0–17.0)
MCH: 30.8 pg (ref 26.0–34.0)
MCHC: 32 g/dL (ref 30.0–36.0)
MCV: 96.2 fL (ref 80.0–100.0)
NRBC: 1 % — AB (ref 0.0–0.2)
PLATELETS: 324 10*3/uL (ref 150–400)
RBC: 2.66 MIL/uL — ABNORMAL LOW (ref 4.22–5.81)
RDW: 19.5 % — ABNORMAL HIGH (ref 11.5–15.5)
WBC: 7.8 10*3/uL (ref 4.0–10.5)

## 2019-03-01 LAB — PROTIME-INR
INR: 1.2 (ref 0.8–1.2)
Prothrombin Time: 15.1 seconds (ref 11.4–15.2)

## 2019-03-01 LAB — TYPE AND SCREEN
ABO/RH(D): O POS
Antibody Screen: NEGATIVE
Donor AG Type: NEGATIVE
Unit division: 0

## 2019-03-01 LAB — GLUCOSE, CAPILLARY: GLUCOSE-CAPILLARY: 158 mg/dL — AB (ref 70–99)

## 2019-03-01 LAB — HEPARIN LEVEL (UNFRACTIONATED): Heparin Unfractionated: 0.44 IU/mL (ref 0.30–0.70)

## 2019-03-01 MED ORDER — ENOXAPARIN SODIUM 80 MG/0.8ML ~~LOC~~ SOLN: 75.0000 mg | Freq: Two times a day (BID) | SUBCUTANEOUS | 0 refills | Status: DC

## 2019-03-01 MED ORDER — PANTOPRAZOLE SODIUM 40 MG PO TBEC: 40.0000 mg | DELAYED_RELEASE_TABLET | Freq: Every day | ORAL | 0 refills | Status: DC

## 2019-03-01 MED ORDER — ENOXAPARIN SODIUM 80 MG/0.8ML ~~LOC~~ SOLN
75.0000 mg | Freq: Two times a day (BID) | SUBCUTANEOUS | Status: DC
  Administered 2019-03-01: 75 mg via SUBCUTANEOUS
  Filled 2019-03-01: qty 0.8

## 2019-03-01 MED FILL — ENOXAPARIN 80 MG/0.8 ML SYR: 80 | 14 days supply | Qty: 11 | Fill #0

## 2019-03-01 MED FILL — PANTOPRAZOLE SOD DR 40 MG T: 40 | 30 days supply | Qty: 30 | Fill #0

## 2019-03-01 NOTE — Discharge Summary (Signed)
PATIENT DETAILS Name: [name] [name] Age: 73 y.o. Sex: male Date of Birth: [birth year] MRN: [id number]. Admitting Physician: Etta Quill, DO TIW:[id number], Jenny Reichmann, MD  Admit Date: 02/23/2019 Discharge date: 03/01/2019  Recommendations for Outpatient Follow-up:  1. Follow up with PCP in 1-2 weeks 2. Please obtain BMP/CBC in one week 3. Continue Lovenox until INR therapeutic  Admitted From:  Home  Disposition: [place]: No  Equipment/Devices: None  Discharge Condition: Stable  CODE STATUS: FULL CODE  Diet recommendation:  Heart Healthy / Carb Modified   Brief Summary: See H&P, Labs, Consult and Test reports for all details in brief,Patient is a 73 y.o. male with history of mechanical aortic valve replacement in 2004, CAD status post CABG in 2004, PAD-presented with upper GI bleeding with acute blood loss anemia in the setting of supratherapeutic INR.  See below for further details.  Brief Hospital Course: Upper GI bleeding with acute blood loss anemia: GI bleeding has resolved-EGD on 3/23 showed no obvious foci of bleeding.  He required 2 units of PRBC-hemoglobin stable at 8.1.  He feels much better-he no longer is having exertional dyspnea that he had on 3/24 (pre-transfusion).  He was to be started on overlapping heparin/Coumadin post EGD-he is doing well without any melena while on anticoagulation.  He will be discharged home in a stable manner-continue PPI on discharge.  Supratherapeutic INR: Given vitamin K on day of admission-INR has now normalized-restarted on anticoagulation.  DM-2: CBGs stable-SSI-resume oral hypoglycemic agents on discharge  Hypertension: Blood pressure stable-hypotensive briefly held-resume usual antihypertensive medications on discharge.  History of mechanical valve replacement secondary to aortic stenosis due to bicuspid aortic valve: Anticoagulation was held-INR was allowed to drift down for EGD-has been restarted on IV  heparin and Coumadin post EGD.  INR still subtherapeutic-we will transition to overlapping Lovenox and Coumadin.  Follow-up with the Coumadin clinic for further care.  Continue on Lovenox as long as INR subtherapeutic.   History of CAD s/p CABG and PAD: Stable-did have some exertional dyspnea-as he was symptomatic with a hemoglobin level of 7.1-he was transfused hemoglobin on 3/24.  He feels much better today.  Given the fact that patient has GI bleeding requiring PRBC transfusion-have stopped aspirin-continue with Coumadin.  Procedures/Studies: 3/23>> EGD  Discharge Diagnoses:  Principal Problem:   Gastrointestinal hemorrhage with melena Active Problems:   Diabetes ([place])   Essential hypertension   H/O mechanical aortic valve replacement   Supratherapeutic INR   Acute blood loss anemia   Symptomatic anemia   Discharge Instructions:  Activity:  As tolerated with Full fall precautions use walker/cane & assistance as needed  Discharge Instructions    Diet - low sodium heart healthy   Complete by:  As directed    Diet Carb Modified   Complete by:  As directed    Discharge instructions   Complete by:  As directed    Follow with Primary MD  Shon Baton, MD in 1 week  Follow with Coumadin clinic on 3/26 for INR check-continue overlapping Lovenox until INR therapeutic  Please get a complete blood count and chemistry panel checked by your Primary MD at your next visit, and again as instructed by your Primary MD.  Get Medicines reviewed and adjusted: Please take all your medications with you for your next visit with your Primary MD  Laboratory/radiological data: Please request your Primary MD to go over all hospital tests and procedure/radiological results at the follow up, please ask your Primary MD  to get all Hospital records sent to his/her office.  In some cases, they will be blood work, cultures and biopsy results pending at the time of your discharge. Please request that your  primary care M.D. follows up on these results.  Also Note the following: If you experience worsening of your admission symptoms, develop shortness of breath, life threatening emergency, suicidal or homicidal thoughts you must seek medical attention immediately by calling 911 or calling your MD immediately  if symptoms less severe.  You must read complete instructions/literature along with all the possible adverse reactions/side effects for all the Medicines you take and that have been prescribed to you. Take any new Medicines after you have completely understood and accpet all the possible adverse reactions/side effects.   Do not drive when taking Pain medications or sleeping medications (Benzodaizepines)  Do not take more than prescribed Pain, Sleep and Anxiety Medications. It is not advisable to combine anxiety,sleep and pain medications without talking with your primary care practitioner  Special Instructions: If you have smoked or chewed Tobacco  in the last 2 yrs please stop smoking, stop any regular Alcohol  and or any Recreational drug use.  Wear Seat belts while driving.  Please note: You were cared for by a hospitalist during your hospital stay. Once you are discharged, your primary care physician will handle any further medical issues. Please note that NO REFILLS for any discharge medications will be authorized once you are discharged, as it is imperative that you return to your primary care physician (or establish a relationship with a primary care physician if you do not have one) for your post hospital discharge needs so that they can reassess your need for medications and monitor your lab values.   Increase activity slowly   Complete by:  As directed      Allergies as of 03/01/2019   No Known Allergies     Medication List    STOP taking these medications   aspirin EC 81 MG tablet     TAKE these medications   amLODipine 10 MG tablet Commonly known as:  NORVASC Take 10 mg  by mouth daily.   atorvastatin 80 MG tablet Commonly known as:  LIPITOR Take 80 mg by mouth daily.   canagliflozin 100 MG Tabs tablet Commonly known as:  INVOKANA Take 100 mg by mouth daily.   enoxaparin 80 MG/0.8ML injection Commonly known as:  LOVENOX Inject 0.75 mLs (75 mg total) into the skin every 12 (twelve) hours for 7 days.   glyBURIDE 2.5 MG tablet Commonly known as:  DIABETA Take 5 mg by mouth 2 (two) times daily with a meal.   hydrochlorothiazide 12.5 MG capsule Commonly known as:  MICROZIDE TAKE 1 CAPSULE BY MOUTH EVERY DAY What changed:  how much to take   metoprolol succinate 50 MG 24 hr tablet Commonly known as:  TOPROL-XL TAKE 1 TABLET (50 MG TOTAL) BY MOUTH DAILY. TAKE WITH OR IMMEDIATELY FOLLOWING A MEAL.   omega-3 acid ethyl esters 1 g capsule Commonly known as:  LOVAZA Take 1 capsule (1 g total) by mouth 2 (two) times daily.   pantoprazole 40 MG tablet Commonly known as:  PROTONIX Take 1 tablet (40 mg total) by mouth daily for 30 days.   Repatha SureClick 409 MG/ML Soaj Generic drug:  Evolocumab Inject 140 mg into the skin every 14 (fourteen) days.   sildenafil 20 MG tablet Commonly known as:  REVATIO Take 100 mg by mouth daily as needed (erectile dysfunction).  trandolapril 4 MG tablet Commonly known as:  MAVIK TAKE 1 TABLET BY MOUTH EVERY DAY   warfarin 10 MG tablet Commonly known as:  COUMADIN Take as directed. If you are unsure how to take this medication, talk to your nurse or doctor. Original instructions:  TAKE AS DIRECTED BY COUMADIN CLINIC What changed:  See the new instructions.      Follow-up Information    Dr. Jenkins Rouge. Go on 03/02/2019.   Why:  Appointment scheduled for 03/02/2019 at 11:20 am for INR to be drawn Contact information:  Internal medicine - cardiovascular disease [place], [place], [place] [address] [phone]       Shon Baton, MD. Schedule an appointment as soon as possible for a visit  in 1 week(s).   Specialty:  Internal Medicine Contact information: [address] [place] [address] [phone]          No Known Allergies  Consultations:   cardiology and GI   Other Procedures/Studies: Dg Chest Port 1 View  Result Date: 02/23/2019 CLINICAL DATA:  Shortness of breath EXAM: PORTABLE CHEST 1 VIEW COMPARISON:  12/20/2017 FINDINGS: The heart size and mediastinal contours are within normal limits. Both lungs are clear. Remote median sternotomy. The visualized skeletal structures are unremarkable. IMPRESSION: No active disease. Electronically Signed   By: Ulyses Jarred M.D.   On: 02/23/2019 18:59      TODAY-DAY OF DISCHARGE:  Subjective:   Sierra Bissonette today has no headache,no chest abdominal pain,no new weakness tingling or numbness, feels much better wants to go home today.   Objective:   Blood pressure (!) 113/44, pulse 89, temperature 98.2 F (36.8 C), temperature source Oral, resp. rate 18, height 5\' 8"  (1.727 m), weight 76.2 kg, SpO2 100 %.  Intake/Output Summary (Last 24 hours) at 03/01/2019 0754 Last data filed at 03/01/2019 0430 Gross per 24 hour  Intake 910.93 ml  Output -  Net 910.93 ml   Filed Weights   02/23/19 1816 02/24/19 0407  Weight: 77.1 kg 76.2 kg    Exam: Awake Alert, Oriented *3, No new F.N deficits, Normal affect [place].AT,PERRAL Supple Neck,No JVD, No cervical lymphadenopathy appriciated.  Symmetrical Chest wall movement, Good air movement bilaterally, CTAB RRR,No Gallops,Rubs or new Murmurs, No Parasternal Heave +ve B.Sounds, Abd Soft, Non tender, No organomegaly appriciated, No rebound -guarding or rigidity. No Cyanosis, Clubbing or edema, No new Rash or bruise   PERTINENT RADIOLOGIC STUDIES: Dg Chest Port 1 View  Result Date: 02/23/2019 CLINICAL DATA:  Shortness of breath EXAM: PORTABLE CHEST 1 VIEW COMPARISON:  12/20/2017 FINDINGS: The heart size and mediastinal contours are within normal limits. Both lungs are  clear. Remote median sternotomy. The visualized skeletal structures are unremarkable. IMPRESSION: No active disease. Electronically Signed   By: Ulyses Jarred M.D.   On: 02/23/2019 18:59     PERTINENT LAB RESULTS: CBC: Recent Labs    02/28/19 0554 02/28/19 1840 03/01/19 0346  WBC 10.6*  --  7.8  HGB 7.1* 8.8* 8.2*  HCT 22.8* 28.3* 25.6*  PLT 312  --  324   CMET CMP     Component Value Date/Time   NA 138 02/25/2019 0413   K 3.6 02/25/2019 0413   CL 102 02/25/2019 0413   CO2 26 02/25/2019 0413   GLUCOSE 143 (H) 02/25/2019 0413   BUN 16 02/25/2019 0413   CREATININE 0.83 02/25/2019 0413   CREATININE 0.74 09/28/2016 1146   CALCIUM 8.4 (L) 02/25/2019 0413  PROT 5.7 (L) 02/23/2019 1801   ALBUMIN 3.4 (L) 02/23/2019 1801   AST 40 02/23/2019 1801   ALT 49 (H) 02/23/2019 1801   ALKPHOS 41 02/23/2019 1801   BILITOT 0.5 02/23/2019 1801   GFRNONAA >60 02/25/2019 0413   GFRAA >60 02/25/2019 0413    GFR Estimated Creatinine Clearance: 76.7 mL/min (by C-G formula based on SCr of 0.83 mg/dL). No results for input(s): LIPASE, AMYLASE in the last 72 hours. No results for input(s): CKTOTAL, CKMB, CKMBINDEX, TROPONINI in the last 72 hours. Invalid input(s): POCBNP No results for input(s): DDIMER in the last 72 hours. No results for input(s): HGBA1C in the last 72 hours. No results for input(s): CHOL, HDL, LDLCALC, TRIG, CHOLHDL, LDLDIRECT in the last 72 hours. No results for input(s): TSH, T4TOTAL, T3FREE, THYROIDAB in the last 72 hours.  Invalid input(s): FREET3 No results for input(s): VITAMINB12, FOLATE, FERRITIN, TIBC, IRON, RETICCTPCT in the last 72 hours. Coags: Recent Labs    02/28/19 0719 03/01/19 0346  INR 1.2 1.2   Microbiology: Recent Results (from the past 240 hour(s))  MRSA PCR Screening     Status: None   Collection Time: 02/23/19 11:05 PM  Result Value Ref Range Status   MRSA by PCR NEGATIVE NEGATIVE Final    Comment:        The GeneXpert MRSA Assay (FDA  approved for NASAL specimens only), is one component of a comprehensive MRSA colonization surveillance program. It is not intended to diagnose MRSA infection nor to guide or monitor treatment for MRSA infections. Performed at [facility], [place] [address], [place], [place] [address]     FURTHER DISCHARGE INSTRUCTIONS:  Get Medicines reviewed and adjusted: Please take all your medications with you for your next visit with your Primary MD  Laboratory/radiological data: Please request your Primary MD to go over all hospital tests and procedure/radiological results at the follow up, please ask your Primary MD to get all Hospital records sent to his/her office.  In some cases, they will be blood work, cultures and biopsy results pending at the time of your discharge. Please request that your primary care M.D. goes through all the records of your hospital data and follows up on these results.  Also Note the following: If you experience worsening of your admission symptoms, develop shortness of breath, life threatening emergency, suicidal or homicidal thoughts you must seek medical attention immediately by calling 911 or calling your MD immediately  if symptoms less severe.  You must read complete instructions/literature along with all the possible adverse reactions/side effects for all the Medicines you take and that have been prescribed to you. Take any new Medicines after you have completely understood and accpet all the possible adverse reactions/side effects.   Do not drive when taking Pain medications or sleeping medications (Benzodaizepines)  Do not take more than prescribed Pain, Sleep and Anxiety Medications. It is not advisable to combine anxiety,sleep and pain medications without talking with your primary care practitioner  Special Instructions: If you have smoked or chewed Tobacco  in the last 2 yrs please stop smoking, stop any regular Alcohol  and or any Recreational drug  use.  Wear Seat belts while driving.  Please note: You were cared for by a hospitalist during your hospital stay. Once you are discharged, your primary care physician will handle any further medical issues. Please note that NO REFILLS for any discharge medications will be authorized once you are discharged, as it is imperative that you return  to your primary care physician (or establish a relationship with a primary care physician if you do not have one) for your post hospital discharge needs so that they can reassess your need for medications and monitor your lab values.  Total Time spent coordinating discharge including counseling, education and face to face time equals  45 minutes.  SignedOren Binet 03/01/2019 7:54 AM

## 2019-03-01 NOTE — Anesthesia Postprocedure Evaluation (Signed)
Anesthesia Post Note  Patient: [name] [name]  Procedure(s) Performed: ESOPHAGOGASTRODUODENOSCOPY (EGD) WITH PROPOFOL (N/A )     Patient location during evaluation: Endoscopy Anesthesia Type: General Level of consciousness: awake and alert Pain management: pain level controlled Vital Signs Assessment: post-procedure vital signs reviewed and stable Respiratory status: spontaneous breathing, nonlabored ventilation, respiratory function stable and patient connected to nasal cannula oxygen Cardiovascular status: blood pressure returned to baseline and stable Postop Assessment: no apparent nausea or vomiting Anesthetic complications: no    Last Vitals:  Vitals:   02/28/19 2141 03/01/19 0557  BP: (!) 144/48 (!) 113/44  Pulse: 71 89  Resp: 17 18  Temp: 36.7 C 36.8 C  SpO2: 96% 100%    Last Pain:  Vitals:   03/01/19 1000  TempSrc:   PainSc: 0-No pain   Pain Goal:                   Muna Demers S

## 2019-03-01 NOTE — Progress Notes (Signed)
ANTICOAGULATION CONSULT NOTE - Initial Consult  Pharmacy Consult for Lovenox Indication: AVR  No Known Allergies  Patient Measurements: Height: 5\' 8"  (172.7 cm) Weight: 167 lb 15.9 oz (76.2 kg) IBW/kg (Calculated) : 68.4  Vital Signs: Temp: 98.2 F (36.8 C) (03/25 0557) Temp Source: Oral (03/25 0557) BP: 113/44 (03/25 0557) Pulse Rate: 89 (03/25 0557)  Labs: Recent Labs    02/27/19 0445  02/28/19 0554 02/28/19 0719 02/28/19 1829 02/28/19 1840 03/01/19 0346  HGB 7.7*   < > 7.1*  --   --  8.8* 8.2*  HCT 24.8*   < > 22.8*  --   --  28.3* 25.6*  PLT 309  --  312  --   --   --  324  LABPROT 20.3*  --   --  15.4*  --   --  15.1  INR 1.8*  --   --  1.2  --   --  1.2  HEPARINUNFRC  --    < > 0.22*  --  0.56  --  0.44   < > = values in this interval not displayed.    Estimated Creatinine Clearance: 76.7 mL/min (by C-G formula based on SCr of 0.83 mg/dL).   Medical History: Past Medical History:  Diagnosis Date  . Adenomatous colon polyp 09/1997  . Anemia   . Aortic stenosis    s/p [facility] mechanical AVR - Chronic Coumadin  . Blood transfusion    "related to ITP"  . Coronary artery disease    s/p cabg x 3 11/2003: lima-lad, seq vg to rpda and rpl  . Diverticulitis of colon   . Heart murmur   . Hyperlipidemia   . Hypertension   . ITP (idiopathic thrombocytopenic purpura)   . Peripheral arterial disease ([place])    a. history of aortobifemoral bypass grafting by Dr. Sherren Mocha early b. LE angiography 04/22/2015 patent aortobifem graft, DES to R SFA  . Peripheral vascular disease ([place])    s/p Left external Iliac Artery stenting and subsequent left femoral endarterectomy 02/2011- post op course complicated by wound infxn req I&D 03/2011  . Renal artery stenosis, native, bilateral ([facility])    a. bilateral renal artery stenosis by recent duplex ultrasound b. L renal artery stent 02/2015, R renal artery patent on angiogram  . Stroke ([place])   . TIA (transient ischemic attack) ~ 2013  .  Type II diabetes mellitus ([facility])     Medications:  Medications Prior to Admission  Medication Sig Dispense Refill Last Dose  . amLODipine (NORVASC) 10 MG tablet Take 10 mg by mouth daily.  5 02/23/2019 at Unknown time  . aspirin EC 81 MG tablet Take 81 mg by mouth daily.   02/23/2019 at Unknown time  . atorvastatin (LIPITOR) 80 MG tablet Take 80 mg by mouth daily.    02/23/2019 at Unknown time  . canagliflozin (INVOKANA) 100 MG TABS tablet Take 100 mg by mouth daily.   02/23/2019 at Unknown time  . Evolocumab (REPATHA SURECLICK) 573 MG/ML SOAJ Inject 140 mg into the skin every 14 (fourteen) days.   02/20/2019  . glyBURIDE (DIABETA) 2.5 MG tablet Take 5 mg by mouth 2 (two) times daily with a meal.    02/23/2019 at Unknown time  . hydrochlorothiazide (MICROZIDE) 12.5 MG capsule TAKE 1 CAPSULE BY MOUTH EVERY DAY (Patient taking differently: Take 12.5 mg by mouth daily. ) 90 capsule 3 02/23/2019 at Unknown time  . metoprolol succinate (TOPROL-XL) 50 MG 24 hr tablet TAKE 1 TABLET (  50 MG TOTAL) BY MOUTH DAILY. TAKE WITH OR IMMEDIATELY FOLLOWING A MEAL. 90 tablet 1 02/23/2019 at 0800  . omega-3 acid ethyl esters (LOVAZA) 1 g capsule Take 1 capsule (1 g total) by mouth 2 (two) times daily. 60 capsule 0 02/23/2019 at Unknown time  . sildenafil (REVATIO) 20 MG tablet Take 100 mg by mouth daily as needed (erectile dysfunction).    unk  . trandolapril (MAVIK) 4 MG tablet TAKE 1 TABLET BY MOUTH EVERY DAY (Patient taking differently: Take 4 mg by mouth daily. ) 90 tablet 2 02/23/2019 at Unknown time  . warfarin (COUMADIN) 10 MG tablet TAKE AS DIRECTED BY COUMADIN CLINIC (Patient taking differently: Take 5-10 mg by mouth See admin instructions. Take 1 tablet by mouth everyday Take 1/2 tablet by mouth on fridays) 35 tablet 3 02/23/2019 at 0800   Scheduled:  . amLODipine  5 mg Oral Daily  . atorvastatin  80 mg Oral Daily  . enoxaparin (LOVENOX) injection  75 mg Subcutaneous Q12H  . insulin aspart  0-9 Units Subcutaneous  TID AC & HS  . omega-3 acid ethyl esters  1 g Oral BID  . pantoprazole  40 mg Oral BID  . Warfarin - Pharmacist Dosing Inpatient   Does not apply q1800   Infusions:  . heparin 1,300 Units/hr (03/01/19 0430)    Assessment: 73yo male to transition from UFH to LMWH as bridge to therapeutic INR.  Goal of Therapy:  Anti-Xa level 0.6-1 units/ml 4hrs after LMWH dose given Monitor platelets by anticoagulation protocol: Yes   Plan:  Will d/c heparin then in 1h start Lovenox 75mg  SQ Q12H and monitor CBC.  Wynona Neat, PharmD, BCPS  03/01/2019,7:21 AM

## 2019-03-03 ENCOUNTER — Other Ambulatory Visit: Payer: Self-pay | Admitting: *Deleted

## 2019-03-03 ENCOUNTER — Telehealth: Payer: Self-pay

## 2019-03-03 NOTE — Patient Outreach (Signed)
Patient triggered Red on [facility] [facility] Discharge Dashboard, notification sent to:  Jackelyn Poling, RN

## 2019-03-03 NOTE — Telephone Encounter (Signed)

## 2019-03-03 NOTE — Patient Outreach (Signed)
[facility]) Care Management  03/03/2019  [name] [name] [birth year] [id number]   EMMI-general discharge- [place] Day # 1 Date: Thursday 03/02/19 [place] Reason: Scheduled follow-up? No  Insurance: Medicare A & B Blue cross and blue shield supplement  [facility] admissions x 1 ED visits x 1 in the last 6 months  Last admission 02/23/19 to 03/01/19   Outreach attempt # 1 successful  Patient is able to verify HIPAA [facility] Care Management RN reviewed and addressed red alert with patient  EMMI [name] [name] confirms at the time of the EMMI call he had not had the return call from [name] [name] office so the answer to the [facility] question was correct.  He confirms he has had a return call and he will be going to [name] [name] office on Monday 03/06/19 for his primary care MD f/u appointment.   He denies any medical needs at this time  He thanks [facility] RN CM for the f/u call ""but it is all handled"   Social:   Conditions: HTN, Aortic valve disorder, PVD, CAD, DM,  ITP,hyperlipidemia, colon diverticulosis   Medications: He denies concerns with taking medications as prescribed, affording medications, side effects of medications and questions about medications   Appointments: 03/06/19 [name] [name] Primary MD  03/06/19 Cardiology [place] 03/15/19 Cardiology [name] [name] 03/28/19 Neurology [name] [name]  Advance Directives: He denies need for assist with advance directives   Consent: [facility] RN CM reviewed [facility] services with patient. Patient gave verbal consent for services.   Advised patient that there will be further automated EMMI- post discharge calls to assess how the patient is doing following the recent hospitalization Advised the patient that another call may be received from a nurse if any of their responses were abnormal. Patient voiced understanding and was appreciative of f/u call.   Plan: [facility] RN CM will close case at this time as patient has been assessed and no needs  identified/needs resolved.   Pt encouraged to return a call to [place] CM prn  [facility] RN CM sent a successful outreach letter as discussed with [facility] brochure enclosed for review   Pansy Ostrovsky L. Lavina Hamman, RN, BSN, [place] Coordinator Office number [phone] Mobile number [phone]  Main [facility] number [phone] Fax number [phone]

## 2019-03-06 ENCOUNTER — Ambulatory Visit (INDEPENDENT_AMBULATORY_CARE_PROVIDER_SITE_OTHER): Payer: Medicare Other | Admitting: Pharmacist

## 2019-03-06 DIAGNOSIS — G459 Transient cerebral ischemic attack, unspecified: Secondary | ICD-10-CM | POA: Diagnosis not present

## 2019-03-06 DIAGNOSIS — Z5181 Encounter for therapeutic drug level monitoring: Secondary | ICD-10-CM | POA: Diagnosis not present

## 2019-03-06 DIAGNOSIS — K922 Gastrointestinal hemorrhage, unspecified: Secondary | ICD-10-CM | POA: Diagnosis not present

## 2019-03-06 DIAGNOSIS — Z7901 Long term (current) use of anticoagulants: Secondary | ICD-10-CM | POA: Diagnosis not present

## 2019-03-06 DIAGNOSIS — Z952 Presence of prosthetic heart valve: Secondary | ICD-10-CM | POA: Diagnosis not present

## 2019-03-06 DIAGNOSIS — I359 Nonrheumatic aortic valve disorder, unspecified: Secondary | ICD-10-CM

## 2019-03-06 DIAGNOSIS — D649 Anemia, unspecified: Secondary | ICD-10-CM | POA: Diagnosis not present

## 2019-03-06 DIAGNOSIS — Z6827 Body mass index (BMI) 27.0-27.9, adult: Secondary | ICD-10-CM | POA: Diagnosis not present

## 2019-03-06 DIAGNOSIS — I1310 Hypertensive heart and chronic kidney disease without heart failure, with stage 1 through stage 4 chronic kidney disease, or unspecified chronic kidney disease: Secondary | ICD-10-CM | POA: Diagnosis not present

## 2019-03-06 LAB — POCT INR: INR: 4 — AB (ref 2.0–3.0)

## 2019-03-09 ENCOUNTER — Telehealth: Payer: Self-pay

## 2019-03-09 NOTE — Telephone Encounter (Signed)

## 2019-03-13 ENCOUNTER — Ambulatory Visit (INDEPENDENT_AMBULATORY_CARE_PROVIDER_SITE_OTHER): Payer: Medicare Other

## 2019-03-13 ENCOUNTER — Other Ambulatory Visit: Payer: Self-pay

## 2019-03-13 DIAGNOSIS — Z5181 Encounter for therapeutic drug level monitoring: Secondary | ICD-10-CM

## 2019-03-13 DIAGNOSIS — Z952 Presence of prosthetic heart valve: Secondary | ICD-10-CM

## 2019-03-13 DIAGNOSIS — I359 Nonrheumatic aortic valve disorder, unspecified: Secondary | ICD-10-CM

## 2019-03-13 DIAGNOSIS — G459 Transient cerebral ischemic attack, unspecified: Secondary | ICD-10-CM

## 2019-03-13 LAB — POCT INR: INR: 4.3 — AB (ref 2.0–3.0)

## 2019-03-13 NOTE — Patient Instructions (Signed)
Description   Called spoke with pt, advised to skip tomorrow's dosage of Coumadin, then start taking 10mg  daily except 5mg  on Mondays, Wednesdays, and Fridays. Recheck INR in 10 days.  Call us with any medication changes or concerns Coumadin Clinic [phone], Main # [phone].

## 2019-03-14 DIAGNOSIS — L570 Actinic keratosis: Secondary | ICD-10-CM | POA: Diagnosis not present

## 2019-03-14 DIAGNOSIS — L738 Other specified follicular disorders: Secondary | ICD-10-CM | POA: Diagnosis not present

## 2019-03-15 ENCOUNTER — Telehealth: Payer: Self-pay

## 2019-03-15 DIAGNOSIS — D6489 Other specified anemias: Secondary | ICD-10-CM | POA: Diagnosis not present

## 2019-03-15 DIAGNOSIS — D649 Anemia, unspecified: Secondary | ICD-10-CM | POA: Diagnosis not present

## 2019-03-15 NOTE — Telephone Encounter (Signed)
-----   Message from Willia Craze, NP sent at 03/15/2019  8:58 AM EDT ----- Eustaquio Maize, please see Dr. Lynne Leader message below. Can you get him a "follow up" with Fuller Plan next week or week after? Also see if he had a CBC. Thanks.Marland KitchenMarland KitchenPG ----- Message ----- From: Ladene Artist, MD Sent: 03/13/2019   4:34 PM EDT To: Willia Craze, NP  A telemedicine follow up with me would be good for later this month, about 3-4 weeks post discharge. If he has a CBC by his PCP, Virgina Jock, before then please try to obtain that result. We are trying Zoom for appointments going forward. WebEx has not been ideal. Thx.  ----- Message ----- From: Willia Craze, NP Sent: 03/13/2019   2:48 PM EDT To: Ladene Artist, MD  Hi,  I had sent taff message to myself to make this patient a follow up appointment.  With the Covidien 19 I was not able to get him in for an actual appointment.  I can have Beth put him on list for when we resume seeing patients in the office unless you want to see him as a WebEx hospital follow-up visit.  I just did not want him to get lost to follow-up.  Thanks PG

## 2019-03-15 NOTE — Telephone Encounter (Signed)
Spoke to Mr [name] [name] and scheduled a telephone visit for 03-22-2019 at 9:30am.

## 2019-03-15 NOTE — Telephone Encounter (Signed)
Spoke to the patient. He is at [facility] at this moment to have labs drawn. He is aware we will contact him to arrange a virtual visit with Dr Fuller Plan.

## 2019-03-19 IMAGING — MR MR HEAD W/O CM
9 of 10 series · 35 of 48 positions shown · non-contrast
Comparison: CT same day.  MRI 05/05/2013.

CLINICAL DATA: Two day history of right facial weakness and slurred
speech.

EXAM:
MRI HEAD WITHOUT CONTRAST
TECHNIQUE: Multiplanar, multiecho pulse sequences of the brain and surrounding
structures were obtained without intravenous contrast.

[Series 3: DWI · axial · 3.0mm · 1.09mm/px · z∈[-13,+124]mm · 8 of 94 slices shown (1 of 4)]
[im 1/94]
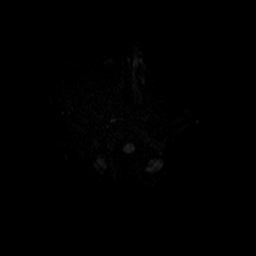
[im 14/94]
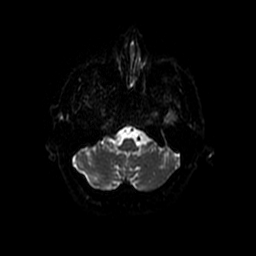
[im 27/94]
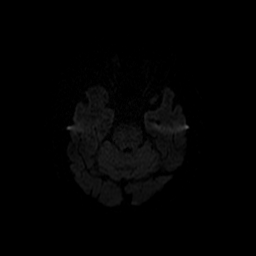
[im 40/94]
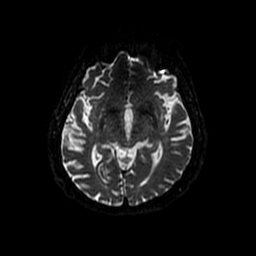
[im 54/94]
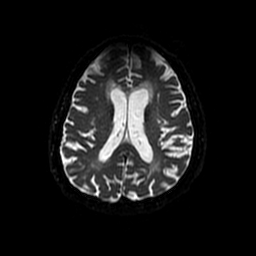
[im 67/94]
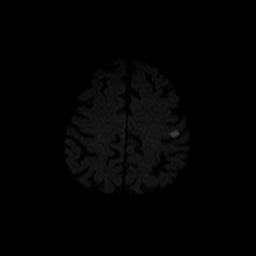
[im 80/94]
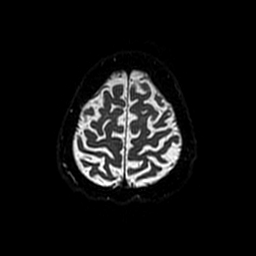
[im 94/94]
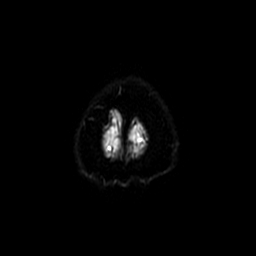

[Series 4: DWI · coronal · 5.0mm · 1.09mm/px · 7 of 68 slices shown (2 of 4)]
[im 1/68]
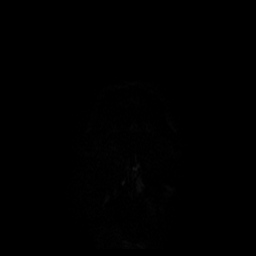
[im 12/68]
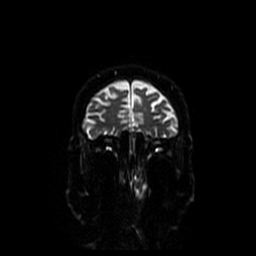
[im 23/68]
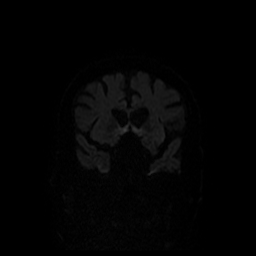
[im 34/68]
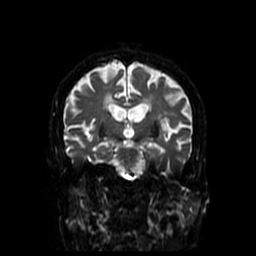
[im 45/68]
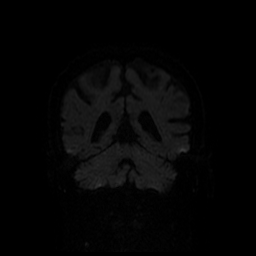
[im 56/68]
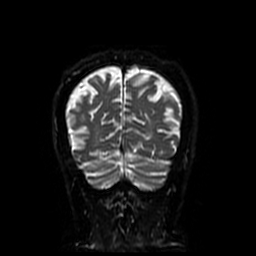
[im 68/68]
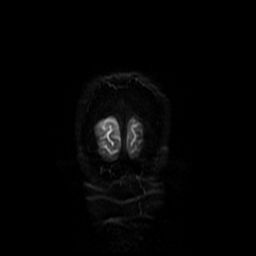

[Series 5: T1 · sagittal · 5.0mm · 0.47mm/px · 2 of 23 slices shown]
[im 1/23]
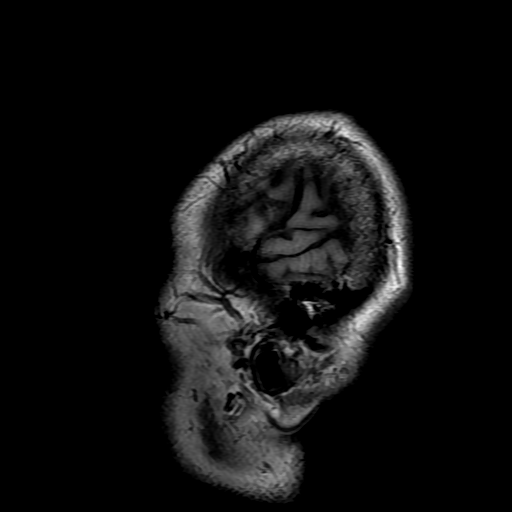
[im 23/23]
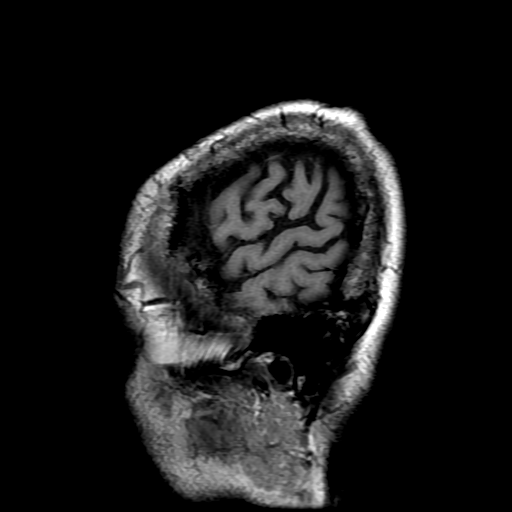

[Series 6: T2 · axial · 5.0mm · 0.43mm/px · z∈[-18,+130]mm · 3 of 26 slices shown (1 of 2)]
[im 1/26]
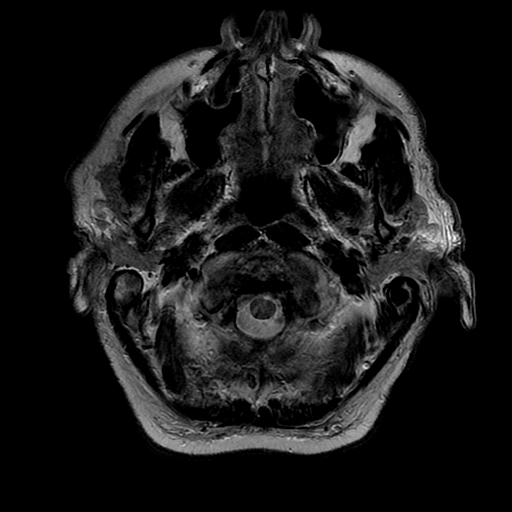
[im 13/26]
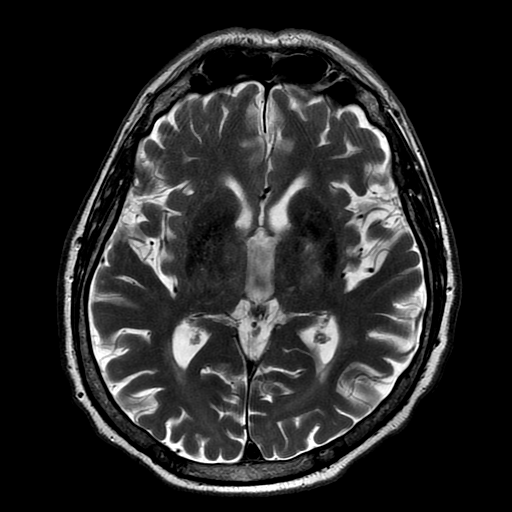
[im 26/26]
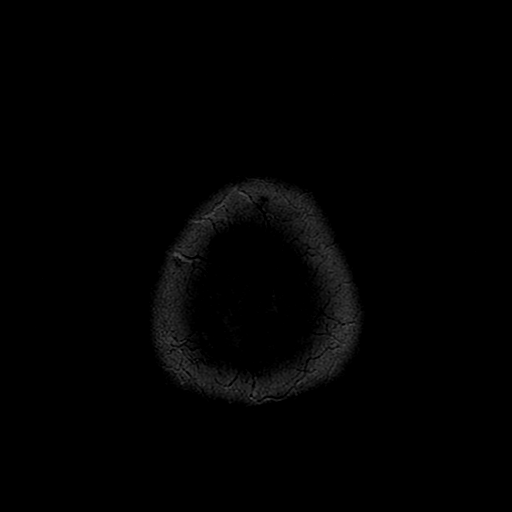

[Series 7: FLAIR · axial · 3.0mm · 0.43mm/px · z∈[-18,+130]mm · 3 of 26 slices shown]
[im 1/26]
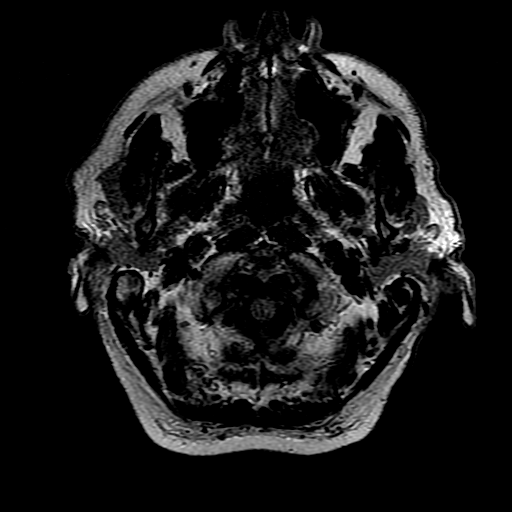
[im 13/26]
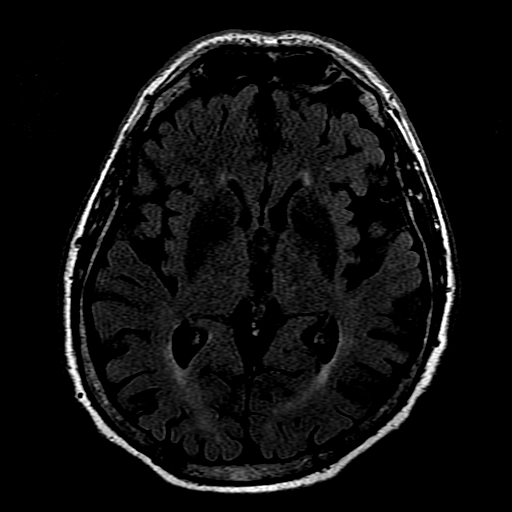
[im 26/26]
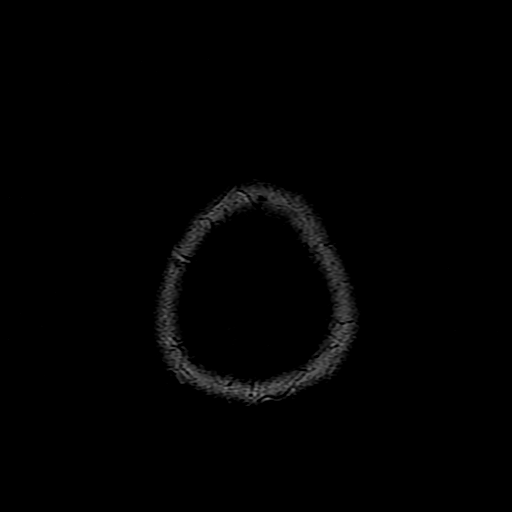

[Series 8: ax mpgr · axial · 5.0mm · 0.43mm/px · 1 of 26 slices shown]
[im 1/26]
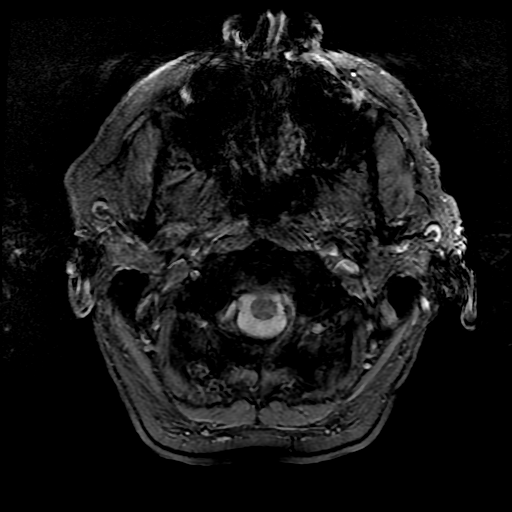

[Series 10: T2 · coronal · 5.0mm · 0.39mm/px · 3 of 27 slices shown (2 of 2)]
[im 1/27]
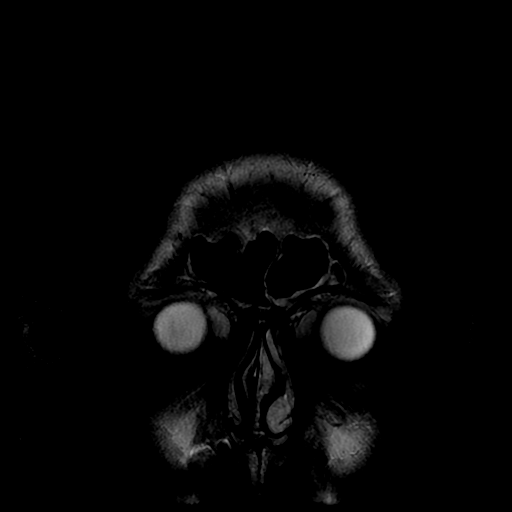
[im 14/27]
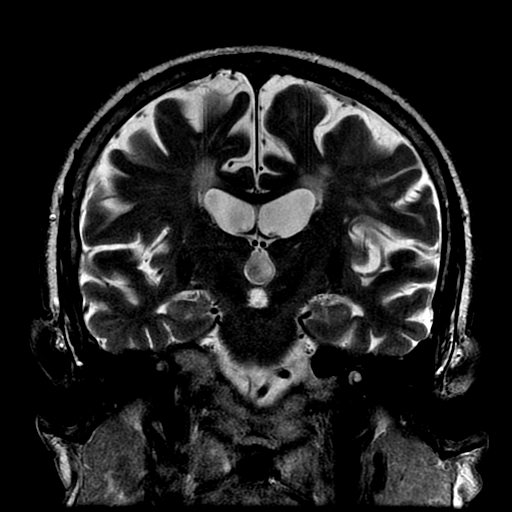
[im 27/27]
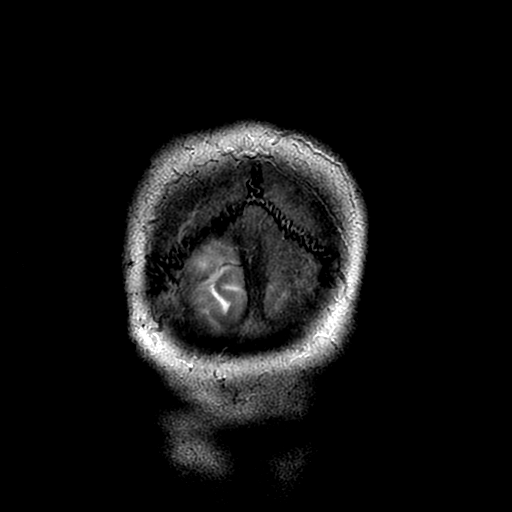

[Series 300: DWI · axial · 3.0mm · 1.09mm/px · z∈[-13,+124]mm · 5 of 47 slices shown (3 of 4)]
[im 1/47]
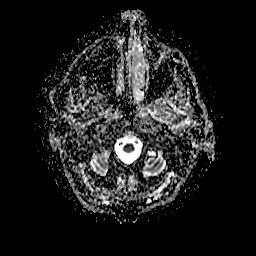
[im 12/47]
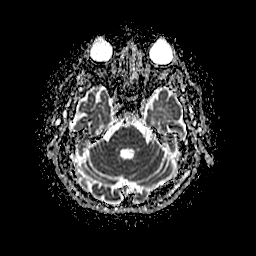
[im 24/47]
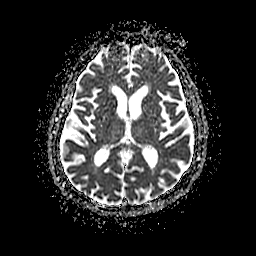
[im 35/47]
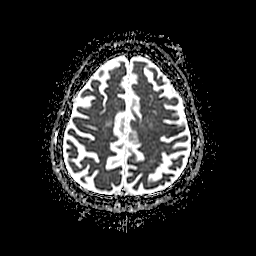
[im 47/47]
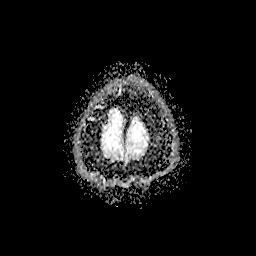

[Series 400: DWI · coronal · 5.0mm · 1.09mm/px · 3 of 34 slices shown (4 of 4)]
[im 1/34]
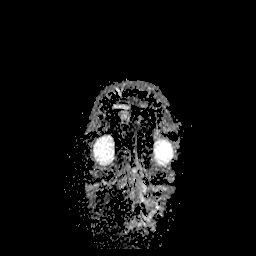
[im 17/34]
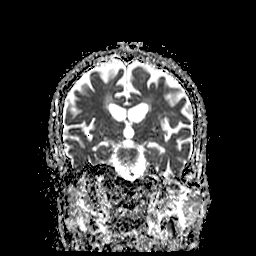
[im 34/34]
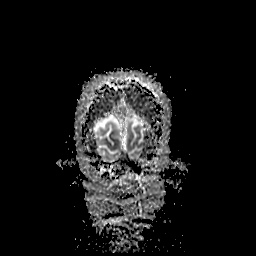

[35 of 48 positions shown; findings below may reference images not displayed]

FINDINGS: Brain: Diffusion imaging shows a 1 x 1.5 cm acute infarction on the
left probably affecting the precentral gyrus. No other acute
infarction. Chronic small-vessel ischemic changes affect the pons.
There are a few old small vessel cerebellar infarctions. Cerebral
hemispheres show moderate chronic small-vessel ischemic changes of
the deep and subcortical white matter. No large vessel territory
infarction. No mass lesion, hemorrhage, hydrocephalus or extra-axial
collection.

Vascular: Major vessels at the base of the brain show flow.

Skull and upper cervical spine: Negative

Sinuses/Orbits: Clear/normal

Other: None
IMPRESSION: 1 cm acute infarction affecting a left frontoparietal gyrus,
probably the precentral gyrus. No sign of hemorrhage or swelling.

Chronic small-vessel ischemic changes elsewhere throughout the
brain, somewhat progressive since [DATE].

## 2019-03-20 ENCOUNTER — Telehealth: Payer: Self-pay

## 2019-03-20 NOTE — Telephone Encounter (Signed)
I called pt about his appt being change to video visit due to COVID 19. Pt stated he wanted to r/s. PT stated he wanted to r/s out when the COVID 19 is over. I advise pt that we are not r/s due to him being schedule too far out. Pt stated he is doing fine and not having any neuro needs. PT will follow up as needed.

## 2019-03-22 ENCOUNTER — Telehealth: Payer: Self-pay

## 2019-03-22 ENCOUNTER — Ambulatory Visit (INDEPENDENT_AMBULATORY_CARE_PROVIDER_SITE_OTHER): Payer: Medicare Other | Admitting: Gastroenterology

## 2019-03-22 ENCOUNTER — Other Ambulatory Visit: Payer: Self-pay

## 2019-03-22 ENCOUNTER — Encounter: Payer: Self-pay | Admitting: Gastroenterology

## 2019-03-22 VITALS — Ht 68.0 in | Wt 172.0 lb

## 2019-03-22 DIAGNOSIS — K250 Acute gastric ulcer with hemorrhage: Secondary | ICD-10-CM | POA: Diagnosis not present

## 2019-03-22 DIAGNOSIS — Z7901 Long term (current) use of anticoagulants: Secondary | ICD-10-CM | POA: Diagnosis not present

## 2019-03-22 DIAGNOSIS — D509 Iron deficiency anemia, unspecified: Secondary | ICD-10-CM

## 2019-03-22 MED ORDER — PANTOPRAZOLE SODIUM 40 MG PO TBEC: 40.0000 mg | DELAYED_RELEASE_TABLET | Freq: Every day | ORAL | 11 refills | Status: DC

## 2019-03-22 NOTE — Telephone Encounter (Signed)

## 2019-03-22 NOTE — Progress Notes (Addendum)
History of Present Illness: This is a 73 year old male who was hospitalized on March 19 for melena and a supratherapeutic INR.  He is status post mechanical aortic valve placement and maintained on Coumadin.  His INR on admission was 7.5.  His hemoglobin decreased to 7.2 and he received 2 units PRBC transfusion.  EGD on March 23 showed 2 clean based, nonbleeding prepyloric ulcers.  Biopsies for H. pylori not performed due to elevated INR and stool antigen was recommended but was not completed.  He had subsequent follow-up with Dr. Virgina Jock with iron studies showing 3% saturation, iron of 13.  He states he is currently feeling well other than poor exercise tolerance and fatigue with exercise likely attributable to his anemia.  No melena, hematochezia, abdominal pain.   Colonoscopy 09/2016: - Four 5 to 7 mm polyps in the sigmoid colon, in the descending colon and at the appendiceal orifice, removed with a cold snare. Resected and retrieved. (TA, SSA) - Internal hemorrhoids. - Moderate diverticulosis in the sigmoid colon, in the descending colon, in the transverse colon and in the ascending colon. - Normal appearing distal TI - The examination was otherwise normal on direct and retroflexion views. Random biopsies obtained. (normal)   EGD 09/2016: - Normal esophagus. - Gastritis. Biopsied. (chronic gastritis, H pylori negative)  - Normal duodenal bulb and second portion of the duodenum. Biopsied. (normal)   Current Medications, Allergies, Past Medical History, Past Surgical History, Family History and Social History were reviewed in [employer] record.   Physical Exam: Telemedicine - not performed   Assessment and Recommendations:  1.  Two prepyloric gastric ulcers with bleed in setting of supratherapeutic INR.  Continue pantoprazole 40 mg daily long term for healing and then prevention.  Avoid aspirin and NSAIDs.  Plan for EGD in 3 months, late June, to evaluate for  ulcer healing.  2.  Iron deficiency and posthemorrhagic anemia.  Chronic blood loss potentially from chronic gastric ulcers however need to exclude colorectal sources of iron deficiency.  Continue iron daily.  Management of anemia per Dr. Virgina Jock.  Schedule colonoscopy at time of EGD in June. If colonoscopy unrevealing consider VCE.  He was offered the option to proceed with colonoscopy sooner for evaluation of iron deficiency however given anticoagulation needs he would like to do both procedures at the same time, which is reasonable.  Colonoscopy and EGD indications are urgent for late June even if we remain in COVID-19 pandemic restrictions.  3.  Personal history of tubular adenoma, sessile serrated adenoma.  Five-year interval surveillance colonoscopy was previously recommended in October 2022.  4.  Status post mechanical aortic valve replacement maintained on Coumadin.  Supratherapeutic INR was treated with vitamin K.  He was restarted on Coumadin and Lovenox was added at discharge.  Lovenox to be discontinued when Coumadin therapeutic. Hold Coumadin 5 days before procedures and begin Lovenox as instructed by his cardiologist- will instruct when and how to resume after procedures. Low but real risk of cardiovascular event such as heart attack, stroke, embolism, thrombosis or ischemia/infarct of other organs off Coumadin explained and need to seek urgent help if this occurs. The patient consents to proceed. Will communicate by phone or EMR with patient's prescribing provider to confirm that holding Coumadin is reasonable in this case.      These services were provided via telemedicine, audio only.  The patient was at home and the provider was in the office, alone.  We discussed the limitations of evaluation  and management by telemedicine and the availability of in person appointments.  Patient consented for this telemedicine visit and is aware of possible charges for this service.  The other person  participating in the telemedicine service was Marlon Pel, CMA who reviewed medications, allergies, past history and completed AVS.  Time spent on call: 12 minutes

## 2019-03-22 NOTE — Patient Instructions (Signed)
We have sent the following medications to your pharmacy for you to pick up at your convenience: pantoprazole.   We will contact you to schedule your EGD/Colonoscopy for June.

## 2019-03-23 ENCOUNTER — Ambulatory Visit (INDEPENDENT_AMBULATORY_CARE_PROVIDER_SITE_OTHER): Payer: Medicare Other | Admitting: Pharmacist

## 2019-03-23 ENCOUNTER — Other Ambulatory Visit: Payer: Self-pay

## 2019-03-23 DIAGNOSIS — I359 Nonrheumatic aortic valve disorder, unspecified: Secondary | ICD-10-CM | POA: Diagnosis not present

## 2019-03-23 DIAGNOSIS — Z5181 Encounter for therapeutic drug level monitoring: Secondary | ICD-10-CM | POA: Diagnosis not present

## 2019-03-23 DIAGNOSIS — G459 Transient cerebral ischemic attack, unspecified: Secondary | ICD-10-CM | POA: Diagnosis not present

## 2019-03-23 LAB — POCT INR: INR: 3.6 — AB (ref 2.0–3.0)

## 2019-03-28 ENCOUNTER — Ambulatory Visit: Payer: Medicare Other | Admitting: Neurology

## 2019-03-30 ENCOUNTER — Telehealth: Payer: Self-pay | Admitting: *Deleted

## 2019-03-30 NOTE — Telephone Encounter (Signed)
Left message for patien tto call and schedule 6 mos virtual/telephone visit with Dr. Gwenlyn Found

## 2019-04-06 NOTE — Telephone Encounter (Signed)
Left message for patient to call and schedule 6 mos telehealth visit with Dr. Gwenlyn Found

## 2019-04-10 ENCOUNTER — Telehealth: Payer: Self-pay

## 2019-04-10 NOTE — Telephone Encounter (Signed)
lmom for prescreen  

## 2019-04-11 ENCOUNTER — Other Ambulatory Visit: Payer: Self-pay

## 2019-04-11 ENCOUNTER — Ambulatory Visit (INDEPENDENT_AMBULATORY_CARE_PROVIDER_SITE_OTHER): Payer: Medicare Other | Admitting: *Deleted

## 2019-04-11 DIAGNOSIS — G459 Transient cerebral ischemic attack, unspecified: Secondary | ICD-10-CM

## 2019-04-11 DIAGNOSIS — Z5181 Encounter for therapeutic drug level monitoring: Secondary | ICD-10-CM | POA: Diagnosis not present

## 2019-04-11 DIAGNOSIS — I359 Nonrheumatic aortic valve disorder, unspecified: Secondary | ICD-10-CM | POA: Diagnosis not present

## 2019-04-11 LAB — POCT INR: INR: 4.4 — AB (ref 2.0–3.0)

## 2019-04-11 NOTE — Patient Instructions (Addendum)
Description   Spoke with pt and instructed to hold today's dose then start taking 5mg  daily except 10mg  on Sundays, Tuesdays, and Thursdays. Recheck INR in 2 weeks.  Call us with any medication changes or concerns [facility] [phone], Main # [phone].

## 2019-04-18 DIAGNOSIS — D649 Anemia, unspecified: Secondary | ICD-10-CM | POA: Diagnosis not present

## 2019-04-18 DIAGNOSIS — D6489 Other specified anemias: Secondary | ICD-10-CM | POA: Diagnosis not present

## 2019-04-24 ENCOUNTER — Telehealth: Payer: Self-pay

## 2019-04-24 NOTE — Telephone Encounter (Signed)
lmom for prescreen  

## 2019-04-25 ENCOUNTER — Other Ambulatory Visit: Payer: Self-pay

## 2019-04-25 ENCOUNTER — Ambulatory Visit (INDEPENDENT_AMBULATORY_CARE_PROVIDER_SITE_OTHER): Payer: Medicare Other | Admitting: Pharmacist

## 2019-04-25 DIAGNOSIS — I6300 Cerebral infarction due to thrombosis of unspecified precerebral artery: Secondary | ICD-10-CM

## 2019-04-25 DIAGNOSIS — Z5181 Encounter for therapeutic drug level monitoring: Secondary | ICD-10-CM

## 2019-04-25 DIAGNOSIS — G459 Transient cerebral ischemic attack, unspecified: Secondary | ICD-10-CM

## 2019-04-25 DIAGNOSIS — I359 Nonrheumatic aortic valve disorder, unspecified: Secondary | ICD-10-CM

## 2019-04-25 LAB — POCT INR: INR: 2.9 (ref 2.0–3.0)

## 2019-04-25 NOTE — Patient Instructions (Signed)
Description   Spoke with pt and instructed to pt to continue taking 5mg  daily except 10mg  on Sundays, Tuesdays, and Thursdays. Recheck INR in 3 weeks.  Call us with any medication changes or concerns [facility] [phone], Main # [phone].

## 2019-05-10 ENCOUNTER — Telehealth: Payer: Self-pay

## 2019-05-10 NOTE — Telephone Encounter (Signed)
lmomed to move appt and prescreen inoffice aware

## 2019-05-22 ENCOUNTER — Telehealth: Payer: Self-pay

## 2019-05-22 NOTE — Telephone Encounter (Signed)

## 2019-05-26 ENCOUNTER — Ambulatory Visit (INDEPENDENT_AMBULATORY_CARE_PROVIDER_SITE_OTHER): Payer: Medicare Other

## 2019-05-26 ENCOUNTER — Other Ambulatory Visit: Payer: Self-pay

## 2019-05-26 DIAGNOSIS — G459 Transient cerebral ischemic attack, unspecified: Secondary | ICD-10-CM | POA: Diagnosis not present

## 2019-05-26 DIAGNOSIS — Z5181 Encounter for therapeutic drug level monitoring: Secondary | ICD-10-CM

## 2019-05-26 DIAGNOSIS — I359 Nonrheumatic aortic valve disorder, unspecified: Secondary | ICD-10-CM

## 2019-05-26 LAB — POCT INR: INR: 2.7 (ref 2.0–3.0)

## 2019-05-26 NOTE — Patient Instructions (Signed)
Description   Continue on same dosage 5mg  daily except 10mg  on Sundays, Tuesdays, and Thursdays. Recheck INR in 5 weeks.  Call us with any medication changes or concerns [facility] [phone], Main # [phone].

## 2019-06-14 DIAGNOSIS — E1122 Type 2 diabetes mellitus with diabetic chronic kidney disease: Secondary | ICD-10-CM | POA: Diagnosis not present

## 2019-06-14 DIAGNOSIS — D649 Anemia, unspecified: Secondary | ICD-10-CM | POA: Diagnosis not present

## 2019-06-14 DIAGNOSIS — I1310 Hypertensive heart and chronic kidney disease without heart failure, with stage 1 through stage 4 chronic kidney disease, or unspecified chronic kidney disease: Secondary | ICD-10-CM | POA: Diagnosis not present

## 2019-06-14 DIAGNOSIS — Z952 Presence of prosthetic heart valve: Secondary | ICD-10-CM | POA: Diagnosis not present

## 2019-06-14 DIAGNOSIS — N182 Chronic kidney disease, stage 2 (mild): Secondary | ICD-10-CM | POA: Diagnosis not present

## 2019-06-14 DIAGNOSIS — I2510 Atherosclerotic heart disease of native coronary artery without angina pectoris: Secondary | ICD-10-CM | POA: Diagnosis not present

## 2019-06-21 DIAGNOSIS — H43813 Vitreous degeneration, bilateral: Secondary | ICD-10-CM | POA: Diagnosis not present

## 2019-06-21 DIAGNOSIS — H33303 Unspecified retinal break, bilateral: Secondary | ICD-10-CM | POA: Diagnosis not present

## 2019-06-21 DIAGNOSIS — H5213 Myopia, bilateral: Secondary | ICD-10-CM | POA: Diagnosis not present

## 2019-06-21 DIAGNOSIS — H43393 Other vitreous opacities, bilateral: Secondary | ICD-10-CM | POA: Diagnosis not present

## 2019-06-21 DIAGNOSIS — H15833 Staphyloma posticum, bilateral: Secondary | ICD-10-CM | POA: Diagnosis not present

## 2019-06-21 DIAGNOSIS — E119 Type 2 diabetes mellitus without complications: Secondary | ICD-10-CM | POA: Diagnosis not present

## 2019-06-21 DIAGNOSIS — H33332 Multiple defects of retina without detachment, left eye: Secondary | ICD-10-CM | POA: Diagnosis not present

## 2019-06-23 DIAGNOSIS — H33311 Horseshoe tear of retina without detachment, right eye: Secondary | ICD-10-CM | POA: Diagnosis not present

## 2019-06-28 ENCOUNTER — Telehealth: Payer: Self-pay

## 2019-06-28 DIAGNOSIS — H33311 Horseshoe tear of retina without detachment, right eye: Secondary | ICD-10-CM | POA: Diagnosis not present

## 2019-06-28 DIAGNOSIS — H33332 Multiple defects of retina without detachment, left eye: Secondary | ICD-10-CM | POA: Diagnosis not present

## 2019-06-28 NOTE — Telephone Encounter (Signed)

## 2019-06-30 ENCOUNTER — Other Ambulatory Visit: Payer: Self-pay

## 2019-06-30 ENCOUNTER — Ambulatory Visit (INDEPENDENT_AMBULATORY_CARE_PROVIDER_SITE_OTHER): Payer: Medicare Other | Admitting: *Deleted

## 2019-06-30 DIAGNOSIS — Z5181 Encounter for therapeutic drug level monitoring: Secondary | ICD-10-CM

## 2019-06-30 LAB — POCT INR: INR: 2.7 (ref 2.0–3.0)

## 2019-06-30 NOTE — Patient Instructions (Signed)
Description   Continue on same dosage 5mg  daily except 10mg  on Sundays, Tuesdays, and Thursdays. Recheck INR in 6 weeks.  Call us with any medication changes or concerns [facility] [phone], Main # [phone].

## 2019-07-12 ENCOUNTER — Ambulatory Visit (HOSPITAL_COMMUNITY)
Admission: RE | Admit: 2019-07-12 | Discharge: 2019-07-12 | Disposition: A | Discharge status: Home / Self Care | Payer: Medicare Other | Source: Ambulatory Visit | Attending: Cardiology | Admitting: Cardiology

## 2019-07-12 ENCOUNTER — Ambulatory Visit (HOSPITAL_BASED_OUTPATIENT_CLINIC_OR_DEPARTMENT_OTHER)
Admission: RE | Admit: 2019-07-12 | Discharge: 2019-07-12 | Disposition: A | Discharge status: Home / Self Care | Payer: Medicare Other | Source: Ambulatory Visit | Attending: Cardiovascular Disease | Admitting: Cardiovascular Disease

## 2019-07-12 ENCOUNTER — Other Ambulatory Visit: Payer: Self-pay

## 2019-07-12 ENCOUNTER — Other Ambulatory Visit: Payer: Self-pay | Admitting: Cardiovascular Disease

## 2019-07-12 ENCOUNTER — Other Ambulatory Visit (HOSPITAL_COMMUNITY): Payer: Self-pay | Admitting: Cardiovascular Disease

## 2019-07-12 DIAGNOSIS — I739 Peripheral vascular disease, unspecified: Secondary | ICD-10-CM | POA: Insufficient documentation

## 2019-07-12 DIAGNOSIS — Z95820 Peripheral vascular angioplasty status with implants and grafts: Secondary | ICD-10-CM

## 2019-07-12 DIAGNOSIS — E7800 Pure hypercholesterolemia, unspecified: Secondary | ICD-10-CM

## 2019-07-12 DIAGNOSIS — I701 Atherosclerosis of renal artery: Secondary | ICD-10-CM | POA: Diagnosis not present

## 2019-07-13 ENCOUNTER — Other Ambulatory Visit: Payer: Self-pay | Admitting: *Deleted

## 2019-07-13 DIAGNOSIS — I739 Peripheral vascular disease, unspecified: Secondary | ICD-10-CM

## 2019-07-13 DIAGNOSIS — I701 Atherosclerosis of renal artery: Secondary | ICD-10-CM

## 2019-07-18 ENCOUNTER — Telehealth: Payer: Self-pay

## 2019-07-18 NOTE — Telephone Encounter (Signed)
Left a message for patient to return my call to schedule EGD/Colon.

## 2019-07-19 NOTE — Telephone Encounter (Signed)
Left message for patient to return my call.

## 2019-07-20 ENCOUNTER — Other Ambulatory Visit: Payer: Self-pay | Admitting: Cardiovascular Disease

## 2019-07-20 NOTE — Telephone Encounter (Signed)
Left a message for patient to return my call. Letter mailed to patient.

## 2019-07-25 ENCOUNTER — Telehealth: Payer: Self-pay

## 2019-07-25 NOTE — Telephone Encounter (Signed)
[facility] Pre-operative Risk Assessment     Request for surgical clearance:     Endoscopy Procedure  What type of surgery is being performed?     EGD/Colon  When is this surgery scheduled?     08/30/19  What type of clearance is required ?   Pharmacy  Are there any medications that need to be held prior to surgery and how long? coumadin  Practice name and name of physician performing surgery?      [facility]  What is your office phone and fax number?      Phone- [phone]  [phone]  Anesthesia type (None, local, MAC, general) ?       MAC

## 2019-07-25 NOTE — Telephone Encounter (Signed)
   Primary Cardiologist: Jenkins Rouge, MD  Chart reviewed as part of pre-operative protocol coverage. Per pharmacy recommendations, patient can hold coumadin 4 days prior to upcoming EGD/colonoscopy but will requiring lovenox bridging which will be coordinated by the coumadin clinic. Patient should restart coumadin as soon as he is cleared to do so by his gastroenterologist.   I will route this recommendation to the requesting party via [facility] fax function and remove from pre-op pool.  Please call with questions.  Abigail Butts, PA-C 07/25/2019, 5:16 PM

## 2019-07-25 NOTE — Telephone Encounter (Signed)
Patient with diagnosis of mechanical AVR on warfarin for anticoagulation.    Procedure: EGD/Colonoscopy Date of procedure: 08/30/2019  CrCl 76 ml/min Platelet count 324  Patient has had a stroke with a subtherapeutic INR.  Patient may hold warfarin for 4 days prior to procedure WITH lovenox bridge.  We will coordinate bridge in coumadin clinic

## 2019-07-26 ENCOUNTER — Telehealth: Payer: Self-pay | Admitting: Gastroenterology

## 2019-07-26 NOTE — Telephone Encounter (Signed)
Made patient aware of Cardiology's note regarding coumadin hold prior to procedure and lovenox bridging. Patient verbalized understanding that he will discuss this with coumadin clinic.

## 2019-08-01 DIAGNOSIS — Z23 Encounter for immunization: Secondary | ICD-10-CM | POA: Diagnosis not present

## 2019-08-02 DIAGNOSIS — E1151 Type 2 diabetes mellitus with diabetic peripheral angiopathy without gangrene: Secondary | ICD-10-CM | POA: Diagnosis not present

## 2019-08-11 ENCOUNTER — Ambulatory Visit (INDEPENDENT_AMBULATORY_CARE_PROVIDER_SITE_OTHER): Payer: Medicare Other | Admitting: *Deleted

## 2019-08-11 ENCOUNTER — Encounter (INDEPENDENT_AMBULATORY_CARE_PROVIDER_SITE_OTHER): Payer: Self-pay

## 2019-08-11 ENCOUNTER — Other Ambulatory Visit: Payer: Self-pay

## 2019-08-11 DIAGNOSIS — Z5181 Encounter for therapeutic drug level monitoring: Secondary | ICD-10-CM

## 2019-08-11 DIAGNOSIS — I359 Nonrheumatic aortic valve disorder, unspecified: Secondary | ICD-10-CM

## 2019-08-11 DIAGNOSIS — G459 Transient cerebral ischemic attack, unspecified: Secondary | ICD-10-CM | POA: Diagnosis not present

## 2019-08-11 LAB — POCT INR: INR: 2.4 (ref 2.0–3.0)

## 2019-08-11 MED ORDER — ENOXAPARIN SODIUM 80 MG/0.8ML ~~LOC~~ SOLN: 80.0000 mg | Freq: Two times a day (BID) | SUBCUTANEOUS | 1 refills | Status: DC

## 2019-08-11 NOTE — Patient Instructions (Addendum)
Description   Today take 1 tablet then continue on same dosage 5mg  daily except 10mg  on Sundays, Tuesdays, and Thursdays. Recheck INR 1 week post procedure. Follow bridge instructions starting 08/25/2019. Call us with any medication changes or concerns [facility] [phone], Main # [phone].      9/18: Last dose of Coumadin.  9/19: No Coumadin or Lovenox.  9/20: Inject Lovenox 80mg  in the fatty abdominal tissue at least 2 inches from the belly button twice a day about 12 hours apart, 8am and 8pm rotate sites. No Coumadin.  9/21: Inject Lovenox in the fatty tissue every 12 hours, 8am and 8pm. No Coumadin.  9/22: Inject Lovenox in the fatty tissue in the morning at 8 am (No PM dose). No Coumadin.  9/23: Procedure Day - No Lovenox - Resume Coumadin in the evening or as directed by doctor (take an extra half tablet with usual dose for 2 days then resume normal dose).  9/24: Resume Lovenox inject in the fatty tissue every 12 hours and take Coumadin.  9/25: Inject Lovenox in the fatty tissue every 12 hours and take Coumadin.  9/26: Inject Lovenox in the fatty tissue every 12 hours and take Coumadin.  9/27: Inject Lovenox in the fatty tissue every 12 hours and take Coumadin.  9/28: Inject Lovenox in the fatty tissue every 12 hours and take Coumadin.  9/29:  Inject Lovenox in the fatty tissue every 12 hours and take Coumadin.  9/30: Inject Lovenox in the fatty tissue at 8am and report to Coumadin appt.

## 2019-08-18 ENCOUNTER — Ambulatory Visit (AMBULATORY_SURGERY_CENTER): Payer: Self-pay | Admitting: *Deleted

## 2019-08-18 ENCOUNTER — Other Ambulatory Visit: Payer: Self-pay

## 2019-08-18 VITALS — Temp 97.7°F | Ht 68.0 in | Wt 172.2 lb

## 2019-08-18 DIAGNOSIS — Z860100 Personal history of colon polyps, unspecified: Secondary | ICD-10-CM

## 2019-08-18 DIAGNOSIS — K250 Acute gastric ulcer with hemorrhage: Secondary | ICD-10-CM

## 2019-08-18 MED ORDER — NA SULFATE-K SULFATE-MG SULF 17.5-3.13-1.6 GM/177ML PO SOLN: 1.0000 | Freq: Once | ORAL | 0 refills | Status: AC

## 2019-08-18 NOTE — Progress Notes (Signed)
No egg or soy allergy known to patient  No issues with past sedation with any surgeries  or procedures, no intubation problems  No diet pills per patient No home 02 use per patient  Patient verbalizes understanding of Lovenox instructions from Coumadin clinic. Pt denies issues with constipation  No A fib or A flutter  EMMI video sent to pt's e mail   Due to the COVID-19 pandemic we are asking patients to follow these guidelines. Please only bring one care partner. Please be aware that your care partner may wait in the car in the parking lot or if they feel like they will be too hot to wait in the car, they may wait in the lobby on the 4th floor. All care partners are required to wear a mask the entire time (we do not have any that we can provide them), they need to practice social distancing, and we will do a Covid check for all patient's and care partners when you arrive. Also we will check their temperature and your temperature. If the care partner waits in their car they need to stay in the parking lot the entire time and we will call them on their cell phone when the patient is ready for discharge so they can bring the car to the front of the building. Also all patient's will need to wear a mask into building.

## 2019-08-22 ENCOUNTER — Telehealth: Payer: Self-pay | Admitting: Cardiovascular Disease

## 2019-08-22 NOTE — Telephone Encounter (Signed)
LVM for patient to call back and schedule followup with Dr. Gwenlyn Found.

## 2019-08-29 ENCOUNTER — Telehealth: Payer: Self-pay

## 2019-08-29 NOTE — Telephone Encounter (Signed)
Covid-19 screening questions   Do you now or have you had a fever in the last 14 days? NO   Do you have any respiratory symptoms of shortness of breath or cough now or in the last 14 days? NO  Do you have any family members or close contacts with diagnosed or suspected Covid-19 in the past 14 days? NO  Have you been tested for Covid-19 and found to be positive? NO        

## 2019-08-30 ENCOUNTER — Encounter: Payer: Self-pay | Admitting: Gastroenterology

## 2019-08-30 ENCOUNTER — Ambulatory Visit (AMBULATORY_SURGERY_CENTER): Payer: Medicare Other | Admitting: Gastroenterology

## 2019-08-30 ENCOUNTER — Other Ambulatory Visit: Payer: Self-pay | Admitting: Gastroenterology

## 2019-08-30 ENCOUNTER — Other Ambulatory Visit: Payer: Self-pay

## 2019-08-30 VITALS — BP 174/98 | HR 71 | Temp 98.0°F | Resp 41 | Ht 68.0 in | Wt 172.0 lb

## 2019-08-30 DIAGNOSIS — K5730 Diverticulosis of large intestine without perforation or abscess without bleeding: Secondary | ICD-10-CM | POA: Diagnosis not present

## 2019-08-30 DIAGNOSIS — D123 Benign neoplasm of transverse colon: Secondary | ICD-10-CM | POA: Diagnosis not present

## 2019-08-30 DIAGNOSIS — K254 Chronic or unspecified gastric ulcer with hemorrhage: Secondary | ICD-10-CM

## 2019-08-30 DIAGNOSIS — D509 Iron deficiency anemia, unspecified: Secondary | ICD-10-CM | POA: Diagnosis not present

## 2019-08-30 DIAGNOSIS — Z860100 Personal history of colon polyps, unspecified: Secondary | ICD-10-CM

## 2019-08-30 DIAGNOSIS — K3189 Other diseases of stomach and duodenum: Secondary | ICD-10-CM | POA: Diagnosis not present

## 2019-08-30 DIAGNOSIS — K633 Ulcer of intestine: Secondary | ICD-10-CM

## 2019-08-30 DIAGNOSIS — D120 Benign neoplasm of cecum: Secondary | ICD-10-CM | POA: Diagnosis not present

## 2019-08-30 DIAGNOSIS — Z8719 Personal history of other diseases of the digestive system: Secondary | ICD-10-CM | POA: Diagnosis not present

## 2019-08-30 DIAGNOSIS — D124 Benign neoplasm of descending colon: Secondary | ICD-10-CM

## 2019-08-30 DIAGNOSIS — K2970 Gastritis, unspecified, without bleeding: Secondary | ICD-10-CM

## 2019-08-30 DIAGNOSIS — K2951 Unspecified chronic gastritis with bleeding: Secondary | ICD-10-CM | POA: Diagnosis not present

## 2019-08-30 DIAGNOSIS — Z8711 Personal history of peptic ulcer disease: Secondary | ICD-10-CM

## 2019-08-30 MED ORDER — SODIUM CHLORIDE 0.9 % IV SOLN: 500.0000 mL | Freq: Once | INTRAVENOUS | Status: DC

## 2019-08-30 NOTE — Op Note (Addendum)
[place] Patient Name: [name] [name] Procedure Date: 08/30/2019 11:10 AM MRN: [id number] Endoscopist: Ladene Artist , MD Age: 73 Referring MD:  Date of Birth: December 01, 1946 Gender: Male Account #: [id number] Procedure:                Colonoscopy Indications:              Iron deficiency anemia. Personal history of                            adenomatous and SSA colon polyps. Medicines:                Monitored Anesthesia Care Procedure:                Pre-Anesthesia Assessment:                           - Prior to the procedure, a History and Physical                            was performed, and patient medications and                            allergies were reviewed. The patient's tolerance of                            previous anesthesia was also reviewed. The risks                            and benefits of the procedure and the sedation                            options and risks were discussed with the patient.                            All questions were answered, and informed consent                            was obtained. Prior Anticoagulants: The patient has                            taken Coumadin (warfarin), last dose was 5 days                            prior to procedure, Lovenox yesterday. ASA Grade                            Assessment: III - A patient with severe systemic                            disease. After reviewing the risks and benefits,                            the patient was deemed in satisfactory condition to  undergo the procedure.                           After obtaining informed consent, the colonoscope                            was passed under direct vision. Throughout the                            procedure, the patient's blood pressure, pulse, and                            oxygen saturations were monitored continuously. The                            Colonoscope was introduced through the anus and                             advanced to the the cecum, identified by                            appendiceal orifice and ileocecal valve. The                            ileocecal valve, appendiceal orifice, and rectum                            were photographed. The quality of the bowel                            preparation was adequate after extensive lavage and                            suctionign. The colonoscopy was performed without                            difficulty. The patient tolerated the procedure                            well. Scope In: 11:13:21 AM Scope Out: 11:34:44 AM Scope Withdrawal Time: 0 hours 19 minutes 31 seconds  Total Procedure Duration: 0 hours 21 minutes 23 seconds  Findings:                 The perianal and digital rectal examinations were                            normal.                           A 8 mm polyp was found in the cecum. The polyp was                            sessile. The polyp was removed with a cold snare.  Resection and retrieval were complete.                           A 10 mm polyp was found in the cecum. The polyp was                            sessile. The polyp was removed with a hot snare.                            Resection and retrieval were complete.                           A 8 mm polyp was found in the transverse colon. The                            polyp was pedunculated. The polyp was removed with                            a hot snare. Resection and retrieval were complete.                           A 6 mm polyp was found in the descending colon. The                            polyp was sessile. The polyp was removed with a                            cold snare. Resection and retrieval were complete.                            There was persistent oozing from the site. To stop                            active bleeding, two hemostatic clips were                            successfully placed  (MR conditional). There was no                            bleeding at the end of the procedure.                           Multiple medium-mouthed diverticula were found in                            the entire colon. There was evidence of                            diverticular spasm. There was no evidence of                            diverticular bleeding.  Internal hemorrhoids were found during                            retroflexion. The hemorrhoids were small and Grade                            I (internal hemorrhoids that do not prolapse).                           The exam was otherwise without abnormality on                            direct and retroflexion views. Complications:            No immediate complications. Estimated blood loss:                            None. Estimated Blood Loss:     Estimated blood loss: none. Impression:               - One 8 mm polyp in the cecum, removed with a cold                            snare. Resected and retrieved.                           - One 10 mm polyp in the cecum, removed with a hot                            snare. Resected and retrieved.                           - One 8 mm polyp in the transverse colon, removed                            with a hot snare. Resected and retrieved.                           - One 6 mm polyp in the descending colon, removed                            with a cold snare. Resected and retrieved. 2 clips                            placed for hemostasis.                           - Moderate diverticulosis in the entire examined                            colon.                           - Internal hemorrhoids.                           -  The examination was otherwise normal on direct                            and retroflexion views. Recommendation:           - Repeat colonoscopy in 3 years for surveillance                            with an extended bowel prep.                            - Resume Coumadin (warfarin) tomorrow at prior dose                            and Lovenox tonight at prior dose. Refer to                            Coumadin Clinic for further adjustment of therapy.                           - Patient has a contact number available for                            emergencies. The signs and symptoms of potential                            delayed complications were discussed with the                            patient. Return to normal activities tomorrow.                            Written discharge instructions were provided to the                            patient.                           - High fiber diet.                           - Continue present medications.                           - Await pathology results.                           - No aspirin, ibuprofen, naproxen, or other                            non-steroidal anti-inflammatory drugs for 2 weeks                            after polyp removal. Ladene Artist, MD 08/30/2019 11:49:29 AM This report has been signed electronically.

## 2019-08-30 NOTE — Op Note (Addendum)
[place] Patient Name: [name] [name] Procedure Date: 08/30/2019 11:10 AM MRN: [id number] Endoscopist: Ladene Artist , MD Age: 73 Referring MD:  Date of Birth: 1946-11-30 Gender: Male Account #: [id number] Procedure:                Upper GI endoscopy Indications:              Iron deficiency anemia, Personal history of peptic                            ulcer disease (follow up gastric ulcers) Medicines:                Monitored Anesthesia Care Procedure:                Pre-Anesthesia Assessment:                           - Prior to the procedure, a History and Physical                            was performed, and patient medications and                            allergies were reviewed. The patient's tolerance of                            previous anesthesia was also reviewed. The risks                            and benefits of the procedure and the sedation                            options and risks were discussed with the patient.                            All questions were answered, and informed consent                            was obtained. Prior Anticoagulants: The patient has                            taken Coumadin (warfarin), last dose was 5 days                            prior to procedure and Lovenox yesterday. ASA Grade                            Assessment: III - A patient with severe systemic                            disease. After reviewing the risks and benefits,                            the patient was deemed in satisfactory condition to  undergo the procedure.                           After obtaining informed consent, the endoscope was                            passed under direct vision. Throughout the                            procedure, the patient's blood pressure, pulse, and                            oxygen saturations were monitored continuously. The                            Endoscope was introduced  through the mouth, and                            advanced to the second part of duodenum. The upper                            GI endoscopy was accomplished without difficulty.                            The patient tolerated the procedure well. Scope In: Scope Out: Findings:                 The examined esophagus was normal.                           Diffuse mild inflammation characterized by erythema                            and granularity was found in the gastric fundus and                            in the gastric body. Biopsies were taken with a                            cold forceps for histology.                           The exam of the stomach was otherwise normal. Prior                            gastric antral ulcers have completely healed.                           The duodenal bulb and second portion of the                            duodenum were normal. Complications:            No immediate complications. Estimated Blood Loss:     Estimated blood loss was minimal. Impression:               -  Normal esophagus.                           - Gastritis. Biopsied.                           - Normal duodenal bulb and second portion of the                            duodenum. Recommendation:           - Patient has a contact number available for                            emergencies. The signs and symptoms of potential                            delayed complications were discussed with the                            patient. Return to normal activities tomorrow.                            Written discharge instructions were provided to the                            patient.                           - Resume previous diet.                           - Continue present medications.                           - Resume Coumadin (warfarin) at prior dose tomorrow                            and Lovenox tonight. Refer to [facility] for                            further  adjustment of therapy.                           - Await pathology results.                           - If iron deficiency persist or recurs consider VCE. Ladene Artist, MD 08/30/2019 11:57:34 AM This report has been signed electronically.

## 2019-08-30 NOTE — Progress Notes (Signed)
To PACU, VSS. Report to [email or web address] 

## 2019-08-30 NOTE — Progress Notes (Signed)
Called to room to assist during endoscopic procedure.  Patient ID and intended procedure confirmed with present staff. Received instructions for my participation in the procedure from the performing physician.Called to room to assist during endoscopic procedure.  Patient ID and intended procedure confirmed with present staff. Received instructions for my participation in the procedure from the performing physician. 

## 2019-08-30 NOTE — Progress Notes (Signed)
Called to room to assist during endoscopic procedure.  Patient ID and intended procedure confirmed with present staff. Received instructions for my participation in the procedure from the performing physician.  

## 2019-08-30 NOTE — Progress Notes (Signed)
Temperature taken by K.A., VS taken by C.W. 

## 2019-08-30 NOTE — Progress Notes (Signed)
Pt's states no medical or surgical changes since previsit or office visit. 

## 2019-08-30 NOTE — Patient Instructions (Signed)
Handouts given for polyps, diverticulosis and hemorrhoids.  Also for high fiber diet and gastritis.  Resume your Coumadin tomorrow at prior dose and take Lovenox tonight at prior dose.  No Aspirin, Ibuprofen, Naproxen or other NSAIDS for 2 weeks after your polypectomies.  You have 3 metal clips in your colon, be aware and inform health professionals if you need an MRI.  They will fall off once the polyp sites have healed and you shouldn't notice them.  Await pathology results.  YOU HAD AN ENDOSCOPIC PROCEDURE TODAY AT [facility]:   Refer to the procedure report that was given to you for any specific questions about what was found during the examination.  If the procedure report does not answer your questions, please call your gastroenterologist to clarify.  If you requested that your care partner not be given the details of your procedure findings, then the procedure report has been included in a sealed envelope for you to review at your convenience later.  YOU SHOULD EXPECT: Some feelings of bloating in the abdomen. Passage of more gas than usual.  Walking can help get rid of the air that was put into your GI tract during the procedure and reduce the bloating. If you had a lower endoscopy (such as a colonoscopy or flexible sigmoidoscopy) you may notice spotting of blood in your stool or on the toilet paper. If you underwent a bowel prep for your procedure, you may not have a normal bowel movement for a few days.  Please Note:  You might notice some irritation and congestion in your nose or some drainage.  This is from the oxygen used during your procedure.  There is no need for concern and it should clear up in a day or so.  SYMPTOMS TO REPORT IMMEDIATELY:   Following lower endoscopy (colonoscopy or flexible sigmoidoscopy):  Excessive amounts of blood in the stool  Significant tenderness or worsening of abdominal pains  Swelling of the abdomen that is new, acute  Fever of  100F or higher   Following upper endoscopy (EGD)  Vomiting of blood or coffee ground material  New chest pain or pain under the shoulder blades  Painful or persistently difficult swallowing  New shortness of breath  Fever of 100F or higher  Black, tarry-looking stools  For urgent or emergent issues, a gastroenterologist can be reached at any hour by calling [phone].   DIET:  We do recommend a small meal at first, but then you may proceed to your regular diet.  Drink plenty of fluids but you should avoid alcoholic beverages for 24 hours.  ACTIVITY:  You should plan to take it easy for the rest of today and you should NOT DRIVE or use heavy machinery until tomorrow (because of the sedation medicines used during the test).    FOLLOW UP: Our staff will call the number listed on your records 48-72 hours following your procedure to check on you and address any questions or concerns that you may have regarding the information given to you following your procedure. If we do not reach you, we will leave a message.  We will attempt to reach you two times.  During this call, we will ask if you have developed any symptoms of COVID 19. If you develop any symptoms (ie: fever, flu-like symptoms, shortness of breath, cough etc.) before then, please call [phone].  If you test positive for Covid 19 in the 2 weeks post procedure, please call and report this information  to [place].    If any biopsies were taken you will be contacted by phone or by letter within the next 1-3 weeks.  Please call us at [phone] if you have not heard about the biopsies in 3 weeks.    SIGNATURES/CONFIDENTIALITY: You and/or your care partner have signed paperwork which will be entered into your electronic medical record.  These signatures attest to the fact that that the information above on your After Visit Summary has been reviewed and is understood.  Full responsibility of the confidentiality of this discharge  information lies with you and/or your care-partner.

## 2019-09-01 ENCOUNTER — Telehealth: Payer: Self-pay | Admitting: *Deleted

## 2019-09-01 NOTE — Telephone Encounter (Signed)
First attempt, left VM.  

## 2019-09-01 NOTE — Telephone Encounter (Signed)
  Follow up Call-  Call back number 08/30/2019  Post procedure Call Back phone  # [phone]  Permission to leave phone message Yes  Some recent data might be hidden     Patient questions:  Do you have a fever, pain , or abdominal swelling? No. Pain Score  0 *  Have you tolerated food without any problems? Yes.    Have you been able to return to your normal activities? Yes.    Do you have any questions about your discharge instructions: Diet   No. Medications  No. Follow up visit  No.  Do you have questions or concerns about your Care? Yes.  Patient stated that he had blood and clots in the toilet yesterday. He did not call the office not the doctor on call.   States now that it's "much better." Denies pain and fever.  States "fine today." Patient told to call us if the bleeding starts again.  Actions: * If pain score is 4 or above: No action needed, pain <4.

## 2019-09-02 ENCOUNTER — Encounter (HOSPITAL_COMMUNITY): Payer: Self-pay | Admitting: Emergency Medicine

## 2019-09-02 ENCOUNTER — Inpatient Hospital Stay (HOSPITAL_COMMUNITY)
Admission: EM | Admit: 2019-09-02 | Discharge: 2019-09-04 | DRG: 920 | Disposition: A | Discharge status: Home / Self Care | Payer: Medicare Other | Attending: Internal Medicine | Admitting: Internal Medicine

## 2019-09-02 ENCOUNTER — Telehealth: Payer: Self-pay | Admitting: Physician Assistant

## 2019-09-02 ENCOUNTER — Other Ambulatory Visit: Payer: Self-pay

## 2019-09-02 DIAGNOSIS — I2510 Atherosclerotic heart disease of native coronary artery without angina pectoris: Secondary | ICD-10-CM | POA: Diagnosis not present

## 2019-09-02 DIAGNOSIS — Z833 Family history of diabetes mellitus: Secondary | ICD-10-CM

## 2019-09-02 DIAGNOSIS — Z9889 Other specified postprocedural states: Secondary | ICD-10-CM

## 2019-09-02 DIAGNOSIS — Z961 Presence of intraocular lens: Secondary | ICD-10-CM | POA: Diagnosis present

## 2019-09-02 DIAGNOSIS — I10 Essential (primary) hypertension: Secondary | ICD-10-CM | POA: Diagnosis present

## 2019-09-02 DIAGNOSIS — Z952 Presence of prosthetic heart valve: Secondary | ICD-10-CM | POA: Diagnosis not present

## 2019-09-02 DIAGNOSIS — K921 Melena: Secondary | ICD-10-CM | POA: Diagnosis not present

## 2019-09-02 DIAGNOSIS — D62 Acute posthemorrhagic anemia: Secondary | ICD-10-CM

## 2019-09-02 DIAGNOSIS — Z7901 Long term (current) use of anticoagulants: Secondary | ICD-10-CM | POA: Diagnosis not present

## 2019-09-02 DIAGNOSIS — I252 Old myocardial infarction: Secondary | ICD-10-CM

## 2019-09-02 DIAGNOSIS — E785 Hyperlipidemia, unspecified: Secondary | ICD-10-CM | POA: Diagnosis present

## 2019-09-02 DIAGNOSIS — K5732 Diverticulitis of large intestine without perforation or abscess without bleeding: Secondary | ICD-10-CM | POA: Diagnosis present

## 2019-09-02 DIAGNOSIS — E1151 Type 2 diabetes mellitus with diabetic peripheral angiopathy without gangrene: Secondary | ICD-10-CM | POA: Diagnosis present

## 2019-09-02 DIAGNOSIS — Z951 Presence of aortocoronary bypass graft: Secondary | ICD-10-CM

## 2019-09-02 DIAGNOSIS — E1165 Type 2 diabetes mellitus with hyperglycemia: Secondary | ICD-10-CM | POA: Diagnosis present

## 2019-09-02 DIAGNOSIS — K625 Hemorrhage of anus and rectum: Secondary | ICD-10-CM

## 2019-09-02 DIAGNOSIS — K648 Other hemorrhoids: Secondary | ICD-10-CM | POA: Diagnosis present

## 2019-09-02 DIAGNOSIS — I739 Peripheral vascular disease, unspecified: Secondary | ICD-10-CM

## 2019-09-02 DIAGNOSIS — Z803 Family history of malignant neoplasm of breast: Secondary | ICD-10-CM

## 2019-09-02 DIAGNOSIS — E0865 Diabetes mellitus due to underlying condition with hyperglycemia: Secondary | ICD-10-CM | POA: Diagnosis not present

## 2019-09-02 DIAGNOSIS — K91840 Postprocedural hemorrhage and hematoma of a digestive system organ or structure following a digestive system procedure: Secondary | ICD-10-CM | POA: Diagnosis not present

## 2019-09-02 DIAGNOSIS — F17290 Nicotine dependence, other tobacco product, uncomplicated: Secondary | ICD-10-CM | POA: Diagnosis present

## 2019-09-02 DIAGNOSIS — Z95820 Peripheral vascular angioplasty status with implants and grafts: Secondary | ICD-10-CM | POA: Diagnosis not present

## 2019-09-02 DIAGNOSIS — Z66 Do not resuscitate: Secondary | ICD-10-CM | POA: Diagnosis present

## 2019-09-02 DIAGNOSIS — K633 Ulcer of intestine: Secondary | ICD-10-CM | POA: Diagnosis not present

## 2019-09-02 DIAGNOSIS — Z20828 Contact with and (suspected) exposure to other viral communicable diseases: Secondary | ICD-10-CM | POA: Diagnosis not present

## 2019-09-02 DIAGNOSIS — Z9841 Cataract extraction status, right eye: Secondary | ICD-10-CM

## 2019-09-02 DIAGNOSIS — F17210 Nicotine dependence, cigarettes, uncomplicated: Secondary | ICD-10-CM | POA: Diagnosis present

## 2019-09-02 DIAGNOSIS — Z8673 Personal history of transient ischemic attack (TIA), and cerebral infarction without residual deficits: Secondary | ICD-10-CM

## 2019-09-02 DIAGNOSIS — Z9842 Cataract extraction status, left eye: Secondary | ICD-10-CM

## 2019-09-02 DIAGNOSIS — Z79899 Other long term (current) drug therapy: Secondary | ICD-10-CM

## 2019-09-02 DIAGNOSIS — R197 Diarrhea, unspecified: Secondary | ICD-10-CM | POA: Diagnosis not present

## 2019-09-02 DIAGNOSIS — Z862 Personal history of diseases of the blood and blood-forming organs and certain disorders involving the immune mechanism: Secondary | ICD-10-CM

## 2019-09-02 DIAGNOSIS — Y838 Other surgical procedures as the cause of abnormal reaction of the patient, or of later complication, without mention of misadventure at the time of the procedure: Secondary | ICD-10-CM | POA: Diagnosis present

## 2019-09-02 DIAGNOSIS — Z86010 Personal history of colonic polyps: Secondary | ICD-10-CM

## 2019-09-02 DIAGNOSIS — Z9081 Acquired absence of spleen: Secondary | ICD-10-CM

## 2019-09-02 DIAGNOSIS — Z8249 Family history of ischemic heart disease and other diseases of the circulatory system: Secondary | ICD-10-CM

## 2019-09-02 DIAGNOSIS — K2970 Gastritis, unspecified, without bleeding: Secondary | ICD-10-CM | POA: Diagnosis present

## 2019-09-02 DIAGNOSIS — K922 Gastrointestinal hemorrhage, unspecified: Secondary | ICD-10-CM | POA: Diagnosis present

## 2019-09-02 LAB — COMPREHENSIVE METABOLIC PANEL
ALT: 25 U/L (ref 0–44)
AST: 23 U/L (ref 15–41)
Albumin: 4 g/dL (ref 3.5–5.0)
Alkaline Phosphatase: 50 U/L (ref 38–126)
Anion gap: 10 (ref 5–15)
BUN: 21 mg/dL (ref 8–23)
CO2: 25 mmol/L (ref 22–32)
Calcium: 8.9 mg/dL (ref 8.9–10.3)
Chloride: 103 mmol/L (ref 98–111)
Creatinine, Ser: 0.81 mg/dL (ref 0.61–1.24)
GFR calc Af Amer: >60 mL/min (ref >60)
GFR calc non Af Amer: >60 mL/min (ref >60)
Glucose, Bld: 210 mg/dL — ABNORMAL HIGH (ref 70–99)
Potassium: 4.2 mmol/L (ref 3.5–5.1)
Sodium: 138 mmol/L (ref 135–145)
Total Bilirubin: 0.6 mg/dL (ref 0.3–1.2)
Total Protein: 6.4 g/dL — ABNORMAL LOW (ref 6.5–8.1)

## 2019-09-02 LAB — TYPE AND SCREEN
ABO/RH(D): O POS
Antibody Screen: NEGATIVE

## 2019-09-02 LAB — GLUCOSE, CAPILLARY: Glucose-Capillary: 144 mg/dL — ABNORMAL HIGH (ref 70–99)

## 2019-09-02 LAB — CBC
HCT: 36.8 % — ABNORMAL LOW (ref 39.0–52.0)
Hemoglobin: 11.8 g/dL — ABNORMAL LOW (ref 13.0–17.0)
MCH: 29.4 pg (ref 26.0–34.0)
MCHC: 32.1 g/dL (ref 30.0–36.0)
MCV: 91.5 fL (ref 80.0–100.0)
Platelets: 169 10*3/uL (ref 150–400)
RBC: 4.02 MIL/uL — ABNORMAL LOW (ref 4.22–5.81)
RDW: 20.1 % — ABNORMAL HIGH (ref 11.5–15.5)
WBC: 6.9 10*3/uL (ref 4.0–10.5)
nRBC: 0 % (ref 0.0–0.2)

## 2019-09-02 LAB — PROTIME-INR
INR: 1.6 — ABNORMAL HIGH (ref 0.8–1.2)
Prothrombin Time: 18.7 seconds — ABNORMAL HIGH (ref 11.4–15.2)

## 2019-09-02 LAB — HEMOGLOBIN A1C
Hgb A1c MFr Bld: 7.2 % — ABNORMAL HIGH (ref 4.8–5.6)
Mean Plasma Glucose: 159.94 mg/dL

## 2019-09-02 LAB — SARS CORONAVIRUS 2 BY RT PCR (HOSPITAL ORDER, PERFORMED IN ~~LOC~~ HOSPITAL LAB): SARS Coronavirus 2: NEGATIVE

## 2019-09-02 LAB — HEMOGLOBIN AND HEMATOCRIT, BLOOD
HCT: 35.7 % — ABNORMAL LOW (ref 39.0–52.0)
Hemoglobin: 11.4 g/dL — ABNORMAL LOW (ref 13.0–17.0)

## 2019-09-02 LAB — CBG MONITORING, ED: Glucose-Capillary: 171 mg/dL — ABNORMAL HIGH (ref 70–99)

## 2019-09-02 MED ORDER — ATORVASTATIN CALCIUM 40 MG PO TABS
80.0000 mg | ORAL_TABLET | Freq: Every day | ORAL | Status: DC
  Administered 2019-09-02 – 2019-09-04 (×3): 80 mg via ORAL
  Filled 2019-09-02: qty 1
  Filled 2019-09-02 (×2): qty 2

## 2019-09-02 MED ORDER — PEG-KCL-NACL-NASULF-NA ASC-C 100 G PO SOLR
0.5000 | Freq: Once | ORAL | Status: AC
  Administered 2019-09-02: 100 g via ORAL

## 2019-09-02 MED ORDER — INSULIN ASPART 100 UNIT/ML ~~LOC~~ SOLN
0.0000 [IU] | Freq: Every day | SUBCUTANEOUS | Status: DC
  Filled 2019-09-02: qty 0.05

## 2019-09-02 MED ORDER — PEG-KCL-NACL-NASULF-NA ASC-C 100 G PO SOLR: 1.0000 | Freq: Once | ORAL | Status: DC

## 2019-09-02 MED ORDER — ONDANSETRON HCL 4 MG/2ML IJ SOLN: 4.0000 mg | Freq: Four times a day (QID) | INTRAMUSCULAR | Status: DC | PRN

## 2019-09-02 MED ORDER — ACETAMINOPHEN 325 MG PO TABS: 650.0000 mg | ORAL_TABLET | Freq: Four times a day (QID) | ORAL | Status: DC | PRN

## 2019-09-02 MED ORDER — PEG-KCL-NACL-NASULF-NA ASC-C 100 G PO SOLR
0.5000 | Freq: Once | ORAL | Status: AC
  Administered 2019-09-02: 100 g via ORAL
  Filled 2019-09-02: qty 1

## 2019-09-02 MED ORDER — OXYCODONE HCL 5 MG PO TABS: 5.0000 mg | ORAL_TABLET | ORAL | Status: DC | PRN

## 2019-09-02 MED ORDER — ONDANSETRON HCL 4 MG PO TABS: 4.0000 mg | ORAL_TABLET | Freq: Four times a day (QID) | ORAL | Status: DC | PRN

## 2019-09-02 MED ORDER — METOPROLOL SUCCINATE ER 50 MG PO TB24
50.0000 mg | ORAL_TABLET | Freq: Every day | ORAL | Status: DC
  Administered 2019-09-02 – 2019-09-04 (×3): 50 mg via ORAL
  Filled 2019-09-02 (×3): qty 1

## 2019-09-02 MED ORDER — ACETAMINOPHEN 650 MG RE SUPP: 650.0000 mg | Freq: Four times a day (QID) | RECTAL | Status: DC | PRN

## 2019-09-02 MED ORDER — INSULIN ASPART 100 UNIT/ML ~~LOC~~ SOLN
0.0000 [IU] | Freq: Three times a day (TID) | SUBCUTANEOUS | Status: DC
  Administered 2019-09-02: 2 [IU] via SUBCUTANEOUS
  Administered 2019-09-03: 1 [IU] via SUBCUTANEOUS
  Administered 2019-09-03: 5 [IU] via SUBCUTANEOUS
  Administered 2019-09-03 – 2019-09-04 (×2): 1 [IU] via SUBCUTANEOUS
  Administered 2019-09-04: 3 [IU] via SUBCUTANEOUS
  Filled 2019-09-02: qty 0.09

## 2019-09-02 MED ORDER — ALBUTEROL SULFATE (2.5 MG/3ML) 0.083% IN NEBU: 2.5000 mg | INHALATION_SOLUTION | RESPIRATORY_TRACT | Status: DC | PRN

## 2019-09-02 NOTE — H&P (View-Only) (Signed)
Consultation  Referring Provider: Dr. Tamera Punt    Primary Care Physician:  Shon Baton, MD Primary Gastroenterologist:   Dr. Fuller Plan      Reason for Consultation: Hematochezia status post colonoscopy 5 days ago            HPI:   [name] [name] is a 73 y.o. male with a past medical history as listed below including aortic stenosis status post [facility] Jude mechanical aortic valve on chronic Coumadin, who presents to the ER today with hematochezia.    Today, the patient explains that he had his colonoscopy and endoscopy on Wednesday.  He restarted his Lovenox that night per recommendations and then restarted Coumadin the next day.  He did start seeing some bright red blood with bowel movements on Thursday, 08/31/2019, they seemed to slow and even stop into Friday, but yesterday evening around 8 PM they started back with more clots and have gotten worse today.  Describes at least 12 bloody bowel movements since time of colonoscopy with the last one this morning.  Patient's last dose of Coumadin was last night.   Denies fever, chills, SOB, palpitations or abdominal pain.     Recent GI history: 08/30/2019 colonoscopy with Dr. Fuller Plan - One 8 mm polyp in the cecum, removed with a cold snare. Resected and retrieved. - One 10 mm polyp in the cecum, removed with a hot snare. Resected and retrieved. - One 8 mm polyp in the transverse colon, removed with a hot snare. Resected and retrieved- One 8 mm polyp in the transverse colon, removed with a hot snare. Resected and retrieved. - One 6 mm polyp in the descending colon, removed with a cold snare. Resected and retrieved.  (Per pictures 2 clips placed) - Moderate diverticulosis in the entire examined colon. - Internal hemorrhoids. - The examination was otherwise normal on direct and retroflexion views.  08/30/19 EGD with Dr. Fuller Plan - Normal esophagus. - Gastritis. Biopsied. - Normal duodenal bulb and second portion of the duodenum.  Past Medical  History:  Diagnosis Date  . Adenomatous colon polyp 09/1997  . Anemia   . Aortic stenosis    s/p [facility] mechanical AVR - Chronic Coumadin  . Blood transfusion    "related to ITP"  . Coronary artery disease    s/p cabg x 3 11/2003: lima-lad, seq vg to rpda and rpl  . Diverticulitis of colon   . Heart murmur   . Hyperlipidemia   . Hypertension   . ITP (idiopathic thrombocytopenic purpura)   . Peripheral arterial disease ([place])    a. history of aortobifemoral bypass grafting by Dr. Sherren Mocha early b. LE angiography 04/22/2015 patent aortobifem graft, DES to R SFA  . Peripheral vascular disease ([place])    s/p Left external Iliac Artery stenting and subsequent left femoral endarterectomy 02/2011- post op course complicated by wound infxn req I&D 03/2011  . Renal artery stenosis, native, bilateral ([facility])    a. bilateral renal artery stenosis by recent duplex ultrasound b. L renal artery stent 02/2015, R renal artery patent on angiogram  . Stroke ([place])   . TIA (transient ischemic attack) ~ 2013  . Type II diabetes mellitus ([place])     Past Surgical History:  Procedure Laterality Date  . ABDOMINAL AORTAGRAM N/A 12/16/2011   Procedure: ABDOMINAL Maxcine Ham;  Surgeon: Sherren Mocha, MD;  Location[facility] CATH LAB;  Service: Cardiovascular;  Laterality: N/A;  . ANGIOPLASTY / STENTING ILIAC     Left external Iliac Artery  .  AORTA - BILATERAL FEMORAL ARTERY BYPASS GRAFT  01/18/2012   Procedure: AORTA BIFEMORAL BYPASS GRAFT;  Surgeon: Curt Jews, MD;  Location: [facility];  Service: Vascular;  Laterality: N/A;  . AORTIC VALVE REPLACEMENT  ~ 2004  . CARDIAC CATHETERIZATION  11/2003   /pt report 10/01/2016  . CARDIAC VALVE REPLACEMENT  11/2003   aortic  . CATARACT EXTRACTION W/ INTRAOCULAR LENS  IMPLANT, BILATERAL Bilateral   . COLONOSCOPY    . CORONARY ARTERY BYPASS GRAFT  11/2003   Archie Endo 04/21/2011  . ESOPHAGOGASTRODUODENOSCOPY (EGD) WITH PROPOFOL N/A 02/27/2019   Procedure: ESOPHAGOGASTRODUODENOSCOPY (EGD) WITH  PROPOFOL;  Surgeon: Doran Stabler, MD;  Location: [facility];  Service: Endoscopy;  Laterality: N/A;  . LOWER EXTREMITY ANGIOGRAM N/A 02/21/2015   Procedure: LOWER EXTREMITY ANGIOGRAM;  Surgeon: Lorretta Harp, MD;  Location: [facility] CATH LAB;  Service: Cardiovascular;  Laterality: N/A;  . PERIPHERAL VASCULAR CATHETERIZATION N/A 04/22/2015   Procedure: Lower Extremity Angiography;  Surgeon: Lorretta Harp, MD;  Location: [facility] CV LAB;  Service: Cardiovascular;  Laterality: N/A;  . PERIPHERAL VASCULAR CATHETERIZATION N/A 08/24/2016   Procedure: Lower Extremity Angiography;  Surgeon: Lorretta Harp, MD;  Location: [place] CV LAB;  Service: Cardiovascular;  Laterality: N/A;  . PERIPHERAL VASCULAR CATHETERIZATION Right 10/01/2016   Procedure: Peripheral Vascular Intervention - STENT;  Surgeon: Lorretta Harp, MD;  Location: [place] CV LAB;  Service: Cardiovascular;  Laterality: Right;  Prox and MID SFA   . POLYPECTOMY    . RENAL ANGIOGRAM N/A 02/21/2015   Procedure: RENAL ANGIOGRAM;  Surgeon: Lorretta Harp, MD;  Location: [facility] CATH LAB;  Service: Cardiovascular;  Laterality: Bilateral; 6 mm x 12 mm long Herculink balloon expandable stent to the left renal artery  . RENAL ARTERY STENT Left 04/22/2015   dr berry  . SPLENECTOMY  02/2003   Archie Endo 04/21/2011  . TONSILLECTOMY  ~ 1952    Family History  Problem Relation Age of Onset  . Coronary artery disease Mother        bypass surgery - deceased  . Heart disease Father        murmur, valve replacement - deceased  . Breast cancer Sister   . Diabetes Other        grandmother  . Diabetes Paternal Grandmother   . Diabetes Paternal Aunt   . Colon cancer Neg Hx   . Colon polyps Neg Hx   . Esophageal cancer Neg Hx   . Rectal cancer Neg Hx   . Stomach cancer Neg Hx     Social History   Tobacco Use  . Smoking status: Light Tobacco Smoker    Years: 30.00    Types: Cigarettes, Cigars    Last attempt to quit: 12/15/1993     Years since quitting: 25.7  . Smokeless tobacco: Never Used  . Tobacco comment: occasional cigar  Substance Use Topics  . Alcohol use: Yes    Alcohol/week: 16.0 standard drinks    Types: 1 Cans of beer, 1 Shots of liquor, 14 Standard drinks or equivalent per week    Comment: drinks 2 martini's a night (2 shots in each)  . Drug use: No    Prior to Admission medications   Medication Sig Start Date End Date Taking? Authorizing Provider  amLODipine (NORVASC) 10 MG tablet Take 10 mg by mouth daily. 02/11/15  Yes [provider]  atorvastatin (LIPITOR) 80 MG tablet Take 80 mg by mouth daily.    Yes [provider]  canagliflozin (INVOKANA) 100 MG TABS tablet Take 100 mg by mouth daily.   Yes [provider]  enoxaparin (LOVENOX) 80 MG/0.8ML injection Inject 0.8 mLs (80 mg total) into the skin every 12 (twelve) hours. 08/11/19  Yes Josue Hector, MD  Evolocumab (REPATHA SURECLICK) 732 MG/ML SOAJ Inject 140 mg into the skin every 14 (fourteen) days.   Yes [provider]  glyBURIDE (DIABETA) 2.5 MG tablet Take 5 mg by mouth 2 (two) times daily with a meal.    Yes [provider]  hydrochlorothiazide (MICROZIDE) 12.5 MG capsule TAKE 1 CAPSULE BY MOUTH EVERY DAY Patient taking differently: Take 12.5 mg by mouth daily.  02/02/19  Yes Josue Hector, MD  metoprolol succinate (TOPROL-XL) 50 MG 24 hr tablet TAKE 1 TABLET EVERY DAY WITH OR IMMEDIATELY FOLLOWING A MEAL Patient taking differently: Take 50 mg by mouth daily.  07/20/19  Yes Lorretta Harp, MD  trandolapril ([place]) 4 MG tablet TAKE 1 TABLET BY MOUTH EVERY DAY 07/20/19  Yes Lorretta Harp, MD  warfarin (COUMADIN) 10 MG tablet TAKE AS DIRECTED BY COUMADIN CLINIC Patient taking differently: Take 5-10 mg by mouth See admin instructions. Take 10mg  by mouth everyday Take 5mg  by mouth on fridays 07/14/18  Yes Josue Hector, MD  pantoprazole (PROTONIX) 40 MG tablet Take 1 tablet (40 mg total) by mouth  daily for 30 days. Patient not taking: Reported on 09/02/2019 03/22/19 08/18/19  Ladene Artist, MD    No current facility-administered medications for this encounter.    Current Outpatient Medications  Medication Sig Dispense Refill  . amLODipine (NORVASC) 10 MG tablet Take 10 mg by mouth daily.  5  . atorvastatin (LIPITOR) 80 MG tablet Take 80 mg by mouth daily.     . canagliflozin (INVOKANA) 100 MG TABS tablet Take 100 mg by mouth daily.    [name] enoxaparin (LOVENOX) 80 MG/0.8ML injection Inject 0.8 mLs (80 mg total) into the skin every 12 (twelve) hours. 20 mL 1  . Evolocumab (REPATHA SURECLICK) 202 MG/ML SOAJ Inject 140 mg into the skin every 14 (fourteen) days.    [name] glyBURIDE (DIABETA) 2.5 MG tablet Take 5 mg by mouth 2 (two) times daily with a meal.     . hydrochlorothiazide (MICROZIDE) 12.5 MG capsule TAKE 1 CAPSULE BY MOUTH EVERY DAY (Patient taking differently: Take 12.5 mg by mouth daily. ) 90 capsule 3  . metoprolol succinate (TOPROL-XL) 50 MG 24 hr tablet TAKE 1 TABLET EVERY DAY WITH OR IMMEDIATELY FOLLOWING A MEAL (Patient taking differently: Take 50 mg by mouth daily. ) 90 tablet 1  . trandolapril (MAVIK) 4 MG tablet TAKE 1 TABLET BY MOUTH EVERY DAY 90 tablet 2  . warfarin (COUMADIN) 10 MG tablet TAKE AS DIRECTED BY COUMADIN CLINIC (Patient taking differently: Take 5-10 mg by mouth See admin instructions. Take 10mg  by mouth everyday Take 5mg  by mouth on fridays) 35 tablet 3  . pantoprazole (PROTONIX) 40 MG tablet Take 1 tablet (40 mg total) by mouth daily for 30 days. (Patient not taking: Reported on 09/02/2019) 30 tablet 11    Allergies as of 09/02/2019  . (No Known Allergies)     Review of Systems:    Constitutional: No weight loss, fever or chills Skin: No rash  Cardiovascular: No chest pain Respiratory: No SOB Gastrointestinal: See HPI and otherwise negative Genitourinary: No dysuria  Neurological: No headache, dizziness or syncope Musculoskeletal: No new muscle or  joint pain Hematologic: [place] Psychiatric: No history of depression  or anxiety    Physical Exam:  Vital signs in last 24 hours: Temp:  [98.4 F (36.9 C)] 98.4 F (36.9 C) (09/26 0945) Pulse Rate:  [92] 92 (09/26 0945) Resp:  [19] 19 (09/26 0945) BP: (140)/(115) 140/115 (09/26 0945) SpO2:  [99 %] 99 % (09/26 0945)   General:   Pleasant Caucasian male appears to be in NAD, Well developed, Well nourished, alert and cooperative Head:  Normocephalic and atraumatic. Eyes:   PEERL, EOMI. No icterus. Conjunctiva pink. Ears:  Normal auditory acuity. Neck:  Supple Throat: Oral cavity and pharynx without inflammation, swelling or lesion.  Lungs: Respirations even and unlabored. Lungs clear to auscultation bilaterally.   No wheezes, crackles, or rhonchi.  Heart: Normal S1, S2. No MRG. Regular rate and rhythm. No peripheral edema, cyanosis or pallor.  Abdomen:  Soft, nondistended, nontender. No rebound or guarding. Normal bowel sounds. No appreciable masses or hepatomegaly. Rectal:  Not performed.  Msk:  Symmetrical without gross deformities. Peripheral pulses intact.  Extremities:  Without edema, no deformity or joint abnormality.  Neurologic:  Alert and  oriented x4;  grossly normal neurologically.  Skin:   Dry and intact without significant lesions or rashes. Psychiatric: Demonstrates good judgement and reason without abnormal affect or behaviors.   LAB RESULTS: Recent Labs    09/02/19 1008  WBC 6.9  HGB 11.8*  HCT 36.8*  PLT 169   BMET Recent Labs    09/02/19 1008  NA 138  K 4.2  CL 103  CO2 25  GLUCOSE 210*  BUN 21  CREATININE 0.81  CALCIUM 8.9   LFT Recent Labs    09/02/19 1008  PROT 6.4*  ALBUMIN 4.0  AST 23  ALT 25  ALKPHOS 50  BILITOT 0.6   PT/INR Recent Labs    09/02/19 1008  LABPROT 18.7*  INR 1.6*    Impression / Plan:   Impression: 1.  Hematochezia: Most likely post polypectomy bleed after colonoscopy Wednesday, 08/30/2019 with removal of  4 polyps, patient was initially on Lovenox changed to Coumadin 08/31/2019, last dose 9/20 5 PM, hemoglobin 11.8 at time of admission 2.  Chronic anticoagulation: On Coumadin for mechanical heart valve  Plan: 1.  Patient will be ok off of anticoagulation overnight. Please d/c all anticoagulants. Will likely start back on heparin after time of procedure 2.  We will go ahead and plan for repeat colonoscopy tomorrow  3.  Scheduled patient for colonoscopy tomorrow with Dr. Loletha Carrow. 4.  Will start movi prep today, patient can be on clears and n.p.o. after midnight 5.  Please await any further recommendations from Dr. Loletha Carrow later today  Thank you for your kind consultation, we will continue to follow.  Lavone Nian [facility]  09/02/2019, 11:35 AM  I have reviewed the entire case in detail with the above APP and discussed the plan in detail.  Therefore, I agree with the diagnoses recorded above. In addition,  I have personally interviewed and examined the patient and have personally reviewed any abdominal/pelvic CT scan images.  My additional thoughts are as follows:  Post polypectomy bleeding after recent outpatient colonoscopy.  Increased risk situation with patient on anticoagulation for mechanical aortic valve.  He actually took last both of both Lovenox and Coumadin last evening.  Although it is not noted in the body of Dr. Lynne Leader recent colonoscopy report, the photographs show the 2 clips were placed on the left colon polypectomy site.  Nevertheless, any of the polypectomy sites could be the source at  this point.  He is hemodynamically stable, but has anemia of acute GI blood loss.  The plan is for a bowel preparation tonight and a colonoscopy with me tomorrow morning.  Endoscopic hemostatic therapy will be applied to any necessary polypectomy sites.  He will then need to go back on unfractionated heparin later that day, with exact plans to follow with the procedure report.  I discussed with him  the risk of being off anticoagulation in the setting of mechanical aortic valve.  It is a relatively higher flow valve compared to a mitral valve, thus current practice is that we must accept that risk while we get the bleeding under control because it is a greater threat to his life then valve clot and stroke.  He is agreeable to a colonoscopy tomorrow after risks and benefits were reviewed.  The benefits and risks of the planned procedure were described in detail with the patient or (when appropriate) their health care proxy.  Risks were outlined as including, but not limited to, bleeding, infection, perforation, adverse medication reaction leading to cardiac or pulmonary decompensation, pancreatitis (if ERCP).  The limitation of incomplete mucosal visualization was also discussed.  No guarantees or warranties were given.  Patient at increased risk for cardiopulmonary complications of procedure due to medical comorbidities.  Serial hemoglobin and hematocrit, transfuse as needed.   [name] Office:[phone]

## 2019-09-02 NOTE — ED Notes (Signed)
Attempted to call report again and nurse was busy. Staff reports they will give me a call back shortly.

## 2019-09-02 NOTE — ED Provider Notes (Signed)
[facility] DEPT Provider Note   CSN: [id number] Arrival date & time: 09/02/19  8099     History   Chief Complaint Chief Complaint  Patient presents with  . Rectal Bleeding    HPI [name] [name] is a 73 y.o. male.     HPI   Pt is a 73 y/o male with a h/o aortic stenosis s/p mechanical AVR, CAD, diverticulitis, heart murmur, hyperlipidemia, hypertension, ITP, PVD, renal artery stenosis, CVA, diabetes, who presents to the ED today c/o rectal bleeding. States that he had colonoscopy 9/23. He developed rectal bleeding the next day. The next day he had normal BM w/o blood however yesterday evening he began to have bloody stools again. He states that he has had at least a dozen blood stools since he had the colonoscopy. He reports bright red blood and intermittent clots. Denies and melena. Denies abd pain, NV,   Denies chest pain, sob, lightheadedness, or dizziness.   Patient has history of mechanical valve replacement.  He is currently bridging back to Coumadin after his recent procedure.  He is currently on Coumadin and Lovenox right now.   GI: Dr. Fuller Plan   Past Medical History:  Diagnosis Date  . Adenomatous colon polyp 09/1997  . Anemia   . Aortic stenosis    s/p st. jude mechanical AVR - Chronic Coumadin  . Blood transfusion    "related to ITP"  . Coronary artery disease    s/p cabg x 3 11/2003: lima-lad, seq vg to rpda and rpl  . Diverticulitis of colon   . Heart murmur   . Hyperlipidemia   . Hypertension   . ITP (idiopathic thrombocytopenic purpura)   . Peripheral arterial disease ([place])    a. history of aortobifemoral bypass grafting by Dr. Sherren Mocha early b. LE angiography 04/22/2015 patent aortobifem graft, DES to R SFA  . Peripheral vascular disease ([place])    s/p Left external Iliac Artery stenting and subsequent left femoral endarterectomy 02/2011- post op course complicated by wound infxn req I&D 03/2011  . Renal artery stenosis,  native, bilateral (HCC)    a. bilateral renal artery stenosis by recent duplex ultrasound b. L renal artery stent 02/2015, R renal artery patent on angiogram  . Stroke ([place])   . TIA (transient ischemic attack) ~ 2013  . Type II diabetes mellitus [facility])     Patient Active Problem List   Diagnosis Date Noted  . GI bleed 09/02/2019  . Supratherapeutic INR 02/23/2019  . Gastrointestinal hemorrhage with melena 02/23/2019  . Acute blood loss anemia 02/23/2019  . Symptomatic anemia 02/23/2019  . TIA (transient ischemic attack) 12/20/2017  . Stroke-like symptoms 12/14/2017  . Claudication ([place]) 10/01/2016  . Renal artery arteriosclerosis ([place]) 02/22/2015  . Renal artery stenosis ([place]) 02/21/2015  . Renal artery stenosis, native, bilateral ([place]) 02/08/2015  . H/O mechanical aortic valve replacement 07/20/2014  . Encounter for therapeutic drug monitoring 03/13/2014  . Cerebral infarction ([place]) 05/05/2013  . Aortoiliac occlusive disease ([place]) 01/10/2013  . PVD (peripheral vascular disease) with claudication ([place]) 02/02/2012  . Atherosclerosis of native arteries of extremity with intermittent claudication ([place]) 01/12/2012  . Coronary artery disease   . Aortic stenosis   . Peripheral vascular disease ([place])   . Hypertension   . Hyperlipidemia   . ITP (idiopathic thrombocytopenic purpura)   . Coronary atherosclerosis 10/21/2010  . Transient cerebral ischemia 10/10/2009  . Diabetes ([place]) 10/22/2008  . HYPERCHOLESTEROLEMIA 10/22/2008  . Immune thrombocytopenic purpura ([place]) 10/22/2008  .  Essential hypertension 10/22/2008  . Aortic valve disorder 10/22/2008  . PVD (peripheral vascular disease) ([place]) 10/22/2008  . DIVERTICULITIS, COLON 10/22/2008  . COLONIC POLYPS, HX OF 10/22/2008    Past Surgical History:  Procedure Laterality Date  . ABDOMINAL AORTAGRAM N/A 12/16/2011   Procedure: ABDOMINAL Maxcine Ham;  Surgeon: Sherren Mocha, MD;  Location: [facility] CATH LAB;  Service: Cardiovascular;   Laterality: N/A;  . ANGIOPLASTY / STENTING ILIAC     Left external Iliac Artery  . AORTA - BILATERAL FEMORAL ARTERY BYPASS GRAFT  01/18/2012   Procedure: AORTA BIFEMORAL BYPASS GRAFT;  Surgeon: Curt Jews, MD;  Location: [facility];  Service: Vascular;  Laterality: N/A;  . AORTIC VALVE REPLACEMENT  ~ 2004  . CARDIAC CATHETERIZATION  11/2003   /pt report 10/01/2016  . CARDIAC VALVE REPLACEMENT  11/2003   aortic  . CATARACT EXTRACTION W/ INTRAOCULAR LENS  IMPLANT, BILATERAL Bilateral   . COLONOSCOPY    . CORONARY ARTERY BYPASS GRAFT  11/2003   Archie Endo 04/21/2011  . ESOPHAGOGASTRODUODENOSCOPY (EGD) WITH PROPOFOL N/A 02/27/2019   Procedure: ESOPHAGOGASTRODUODENOSCOPY (EGD) WITH PROPOFOL;  Surgeon: Doran Stabler, MD;  Location: [facility];  Service: Endoscopy;  Laterality: N/A;  . LOWER EXTREMITY ANGIOGRAM N/A 02/21/2015   Procedure: LOWER EXTREMITY ANGIOGRAM;  Surgeon: Lorretta Harp, MD;  Location[facility] CATH LAB;  Service: Cardiovascular;  Laterality: N/A;  . PERIPHERAL VASCULAR CATHETERIZATION N/A 04/22/2015   Procedure: Lower Extremity Angiography;  Surgeon: Lorretta Harp, MD;  Location: [facility] CV LAB;  Service: Cardiovascular;  Laterality: N/A;  . PERIPHERAL VASCULAR CATHETERIZATION N/A 08/24/2016   Procedure: Lower Extremity Angiography;  Surgeon: Lorretta Harp, MD;  Location: [facility] CV LAB;  Service: Cardiovascular;  Laterality: N/A;  . PERIPHERAL VASCULAR CATHETERIZATION Right 10/01/2016   Procedure: Peripheral Vascular Intervention - STENT;  Surgeon: Lorretta Harp, MD;  Location: [facility] CV LAB;  Service: Cardiovascular;  Laterality: Right;  Prox and MID SFA   . POLYPECTOMY    . RENAL ANGIOGRAM N/A 02/21/2015   Procedure: RENAL ANGIOGRAM;  Surgeon: Lorretta Harp, MD;  Location: [facility] CATH LAB;  Service: Cardiovascular;  Laterality: Bilateral; 6 mm x 12 mm long Herculink balloon expandable stent to the left renal artery  . RENAL ARTERY STENT Left 04/22/2015   dr berry   . SPLENECTOMY  02/2003   Archie Endo 04/21/2011  . TONSILLECTOMY  ~ 1952        Home Medications    Prior to Admission medications   Medication Sig Start Date End Date Taking? Authorizing Provider  amLODipine (NORVASC) 10 MG tablet Take 10 mg by mouth daily. 02/11/15  Yes [provider]  atorvastatin (LIPITOR) 80 MG tablet Take 80 mg by mouth daily.    Yes [provider]  canagliflozin (INVOKANA) 100 MG TABS tablet Take 100 mg by mouth daily.   Yes [provider]  enoxaparin (LOVENOX) 80 MG/0.8ML injection Inject 0.8 mLs (80 mg total) into the skin every 12 (twelve) hours. 08/11/19  Yes Josue Hector, MD  Evolocumab (REPATHA SURECLICK) 573 MG/ML SOAJ Inject 140 mg into the skin every 14 (fourteen) days.   Yes [provider]  glyBURIDE (DIABETA) 2.5 MG tablet Take 5 mg by mouth 2 (two) times daily with a meal.    Yes [provider]  hydrochlorothiazide (MICROZIDE) 12.5 MG capsule TAKE 1 CAPSULE BY MOUTH EVERY DAY Patient taking differently: Take 12.5 mg by mouth daily.  02/02/19  Yes Josue Hector, MD  metoprolol succinate (  TOPROL-XL) 50 MG 24 hr tablet TAKE 1 TABLET EVERY DAY WITH OR IMMEDIATELY FOLLOWING A MEAL Patient taking differently: Take 50 mg by mouth daily.  07/20/19  Yes Lorretta Harp, MD  trandolapril ([place]) 4 MG tablet TAKE 1 TABLET BY MOUTH EVERY DAY 07/20/19  Yes Lorretta Harp, MD  warfarin (COUMADIN) 10 MG tablet TAKE AS DIRECTED BY COUMADIN CLINIC Patient taking differently: Take 5-10 mg by mouth See admin instructions. Take 81m by mouth everyday Take 562mby mouth on fridays 07/14/18  Yes NiJosue HectorMD  pantoprazole (PROTONIX) 40 MG tablet Take 1 tablet (40 mg total) by mouth daily for 30 days. Patient not taking: Reported on 09/02/2019 03/22/19 08/18/19  StLadene ArtistMD    Family History Family History  Problem Relation Age of Onset  . Coronary artery disease Mother        bypass surgery - deceased  . Heart  disease Father        murmur, valve replacement - deceased  . Breast cancer Sister   . Diabetes Other        grandmother  . Diabetes Paternal Grandmother   . Diabetes Paternal Aunt   . Colon cancer Neg Hx   . Colon polyps Neg Hx   . Esophageal cancer Neg Hx   . Rectal cancer Neg Hx   . Stomach cancer Neg Hx     Social History Social History   Tobacco Use  . Smoking status: Light Tobacco Smoker    Years: 30.00    Types: Cigarettes, Cigars    Last attempt to quit: 12/15/1993    Years since quitting: 25.7  . Smokeless tobacco: Never Used  . Tobacco comment: occasional cigar  Substance Use Topics  . Alcohol use: Yes    Alcohol/week: 16.0 standard drinks    Types: 1 Cans of beer, 1 Shots of liquor, 14 Standard drinks or equivalent per week    Comment: drinks 2 martini's a night (2 shots in each)  . Drug use: No     Allergies   Patient has no known allergies.   Review of Systems Review of Systems  Constitutional: Negative for chills and fever.  HENT: Negative for ear pain and sore throat.   Eyes: Negative for visual disturbance.  Respiratory: Negative for cough and shortness of breath.   Cardiovascular: Negative for chest pain.  Gastrointestinal: Positive for blood in stool and diarrhea. Negative for abdominal pain, constipation, nausea and vomiting.  Genitourinary: Negative for dysuria and hematuria.  Musculoskeletal: Negative for back pain.  Skin: Negative for color change and rash.  Neurological: Negative for headaches.  All other systems reviewed and are negative.    Physical Exam Updated Vital Signs BP (!) 140/115 (BP Location: Right Arm) Comment: pt stated hasnt had medication today  Pulse 92   Temp 98.4 F (36.9 C) (Oral)   Resp 19   SpO2 99%   Physical Exam Vitals signs and nursing note reviewed.  Constitutional:      Appearance: He is well-developed.  HENT:     Head: Normocephalic and atraumatic.  Eyes:     Conjunctiva/sclera: Conjunctivae  normal.  Neck:     Musculoskeletal: Neck supple.  Cardiovascular:     Rate and Rhythm: Normal rate and regular rhythm.     Pulses: Normal pulses.     Heart sounds: Normal heart sounds. No murmur.  Pulmonary:     Effort: Pulmonary effort is normal. No respiratory distress.     Breath  sounds: Normal breath sounds. No wheezing, rhonchi or rales.  Abdominal:     General: Bowel sounds are normal. There is no distension.     Palpations: Abdomen is soft.     Tenderness: There is no abdominal tenderness. There is no guarding or rebound.  Skin:    General: Skin is warm and dry.  Neurological:     Mental Status: He is alert.      ED Treatments / Results  Labs (all labs ordered are listed, but only abnormal results are displayed) Labs Reviewed  COMPREHENSIVE METABOLIC PANEL - Abnormal; Notable for the following components:      Result Value   Glucose, Bld 210 (*)    Total Protein 6.4 (*)    All other components within normal limits  CBC - Abnormal; Notable for the following components:   RBC 4.02 (*)    Hemoglobin 11.8 (*)    HCT 36.8 (*)    RDW 20.1 (*)    All other components within normal limits  PROTIME-INR - Abnormal; Notable for the following components:   Prothrombin Time 18.7 (*)    INR 1.6 (*)    All other components within normal limits  SARS CORONAVIRUS 2 (HOSPITAL ORDER, [facility])  TYPE AND SCREEN    EKG None  Radiology No results found.  Procedures Procedures (including critical care time)  Medications Ordered in ED Medications  peg 3350 powder (MOVIPREP) kit 200 g (has no administration in time range)     Initial Impression / Assessment and Plan / ED Course  I have reviewed the triage vital signs and the nursing notes.  Pertinent labs & imaging results that were available during my care of the patient were reviewed by me and considered in my medical decision making (see chart for details).     Final Clinical  Impressions(s) / ED Diagnoses   Final diagnoses:  Rectal bleeding   Pt is a 73 y/o male who presents to the ED today for evaluation of rectal bleeding following colonoscopy last week.  Has had bleeding intermittently for the last 3 days.  Mildly hypertensive on arrival, otherwise vital signs are reassuring.  Abdomen is soft and nontender.  Exam reassuring as well.  CBC with mild anemia with hemoglobin at 11.8.  No leukocytosis. CMP nonacute PT/INR mildly elevated at 1.6.  11:48 Pm CONSULT with Luther Redo, PA-C with gastroenterology how was at the bedside during my evaluation. She states that patient will likely repeat colonoscopy likely tomorrow AM. Recommends admission to the hospitalist service   12:04 PM CONSULT with Dr. Cyndia Skeeters who accepts patient for admission   ED Discharge Orders    None       Bishop Dublin 09/02/19 1232    Malvin Johns, MD 09/02/19 1425

## 2019-09-02 NOTE — ED Triage Notes (Addendum)
Pt s/p colonoscopy 5 days ago, has c/o rectal bleeding since 4 days ago. Was small amount of blood and now more with each bowel movement, has blood and bowel movement about every 3 hours. Pt states he has held his coumadin today. Pt has also been on Lovenox. Pt denies pain.

## 2019-09-02 NOTE — ED Notes (Signed)
ED TO INPATIENT HANDOFF REPORT  Name/Age/Gender [name] [name] 73 y.o. male  Code Status    Code Status Orders  (From admission, onward)         Start     Ordered   09/02/19 1427  Do not attempt resuscitation (DNR)  Continuous    Question Answer Comment  In the event of cardiac or respiratory ARREST Do not call a "code blue"   In the event of cardiac or respiratory ARREST Do not perform Intubation, CPR, defibrillation or ACLS   In the event of cardiac or respiratory ARREST Use medication by any route, position, wound care, and other measures to relive pain and suffering. May use oxygen, suction and manual treatment of airway obstruction as needed for comfort.      09/02/19 1426        Code Status History    Date Active Date Inactive Code Status Order ID Comments User Context   02/23/2019 2101 03/01/2019 1340 Full Code [id number]  Etta Quill, DO ED   12/21/2017 0014 12/21/2017 1809 Full Code [id number]  Elwyn Reach, MD ED   12/13/2017 1241 12/18/2017 1827 Full Code [id number]  Rondel Jumbo, PA-C ED   10/01/2016 1118 10/02/2016 1535 Full Code [id number]  Lorretta Harp, MD Inpatient   08/24/2016 1003 08/24/2016 1719 Full Code [id number]  Lorretta Harp, MD Inpatient   04/22/2015 1256 04/23/2015 1600 Full Code [id number]  Lorretta Harp, MD Inpatient   02/21/2015 1840 02/22/2015 1455 Full Code [id number]  Lorretta Harp, MD Inpatient   01/18/2012 1727 01/22/2012 1814 Full Code [id number]  Raquel James, RN Inpatient   Advance Care Planning Activity      Home/SNF/Other Home  Chief Complaint Post Op Complaint, Rectal Bleeding  Level of Care/Admitting Diagnosis ED Disposition    ED Disposition Condition [place]: [place] [734037]  Level of Care: Telemetry [5]  Admit to tele based on following criteria: Other see comments  Comments: GI bleed  Covid Evaluation: Asymptomatic Screening Protocol (No Symptoms)   Diagnosis: GI bleed [096438]  Admitting Physician: Mercy Riding [[id number]]  Attending Physician: Mercy Riding [[id number]]  PT Class (Do Not Modify): Observation [104]  PT Acc Code (Do Not Modify): Observation [10022]       Medical History Past Medical History:  Diagnosis Date  . Adenomatous colon polyp 09/1997  . Anemia   . Aortic stenosis    s/p st. jude mechanical AVR - Chronic Coumadin  . Blood transfusion    "related to ITP"  . Coronary artery disease    s/p cabg x 3 11/2003: lima-lad, seq vg to rpda and rpl  . Diverticulitis of colon   . Heart murmur   . Hyperlipidemia   . Hypertension   . ITP (idiopathic thrombocytopenic purpura)   . Peripheral arterial disease ([place])    a. history of aortobifemoral bypass grafting by Dr. Sherren Mocha early b. LE angiography 04/22/2015 patent aortobifem graft, DES to R SFA  . Peripheral vascular disease ([place])    s/p Left external Iliac Artery stenting and subsequent left femoral endarterectomy 02/2011- post op course complicated by wound infxn req I&D 03/2011  . Renal artery stenosis, native, bilateral (HCC)    a. bilateral renal artery stenosis by recent duplex ultrasound b. L renal artery stent 02/2015, R renal artery patent on angiogram  . Stroke ([place])   . TIA (transient ischemic attack) ~ 2013  . Type II diabetes  mellitus ([place])     Allergies No Known Allergies  IV Location/Drains/Wounds Patient Lines/Drains/Airways Status   Active Line/Drains/Airways    Name:   Placement date:   Placement time:   Site:   Days:   Peripheral IV 09/02/19 Right Antecubital   09/02/19    1008    Antecubital   less than 1          Labs/Imaging Results for orders placed or performed during the hospital encounter of 09/02/19 (from the past 48 hour(s))  Comprehensive metabolic panel     Status: Abnormal   Collection Time: 09/02/19 10:08 AM  Result Value Ref Range   Sodium 138 135 - 145 mmol/L   Potassium 4.2 3.5 - 5.1 mmol/L   Chloride 103 98 - 111 mmol/L    CO2 25 22 - 32 mmol/L   Glucose, Bld 210 (H) 70 - 99 mg/dL   BUN 21 8 - 23 mg/dL   Creatinine, Ser 0.81 0.61 - 1.24 mg/dL   Calcium 8.9 8.9 - 10.3 mg/dL   Total Protein 6.4 (L) 6.5 - 8.1 g/dL   Albumin 4.0 3.5 - 5.0 g/dL   AST 23 15 - 41 U/L   ALT 25 0 - 44 U/L   Alkaline Phosphatase 50 38 - 126 U/L   Total Bilirubin 0.6 0.3 - 1.2 mg/dL   GFR calc non Af Amer >60 >60 mL/min   GFR calc Af Amer >60 >60 mL/min   Anion gap 10 5 - 15    Comment: Performed at [facility], [place] [address] [place], [place] [address]  CBC     Status: Abnormal   Collection Time: 09/02/19 10:08 AM  Result Value Ref Range   WBC 6.9 4.0 - 10.5 K/uL   RBC 4.02 (L) 4.22 - 5.81 MIL/uL   Hemoglobin 11.8 (L) 13.0 - 17.0 g/dL   HCT 36.8 (L) 39.0 - 52.0 %   MCV 91.5 80.0 - 100.0 fL   MCH 29.4 26.0 - 34.0 pg   MCHC 32.1 30.0 - 36.0 g/dL   RDW 20.1 (H) 11.5 - 15.5 %   Platelets 169 150 - 400 K/uL   nRBC 0.0 0.0 - 0.2 %    Comment: Performed at [facility], [place] [address] [place], [place] [address]  Protime-INR - (order if Patient is taking Coumadin / Warfarin)     Status: Abnormal   Collection Time: 09/02/19 10:08 AM  Result Value Ref Range   Prothrombin Time 18.7 (H) 11.4 - 15.2 seconds   INR 1.6 (H) 0.8 - 1.2    Comment: (NOTE) INR goal varies based on device and disease states. Performed at [facility], [place] [address], [place], [place] [address]   Type and screen [place]     Status: None   Collection Time: 09/02/19 10:08 AM  Result Value Ref Range   ABO/RH(D) O POS    Antibody Screen NEG    Sample Expiration      09/05/2019,2359 Performed at [facility], [place] [address], [place], [place] [address]   Hemoglobin A1c     Status: Abnormal   Collection Time: 09/02/19 10:08 AM  Result Value Ref Range   Hgb A1c MFr Bld 7.2 (H) 4.8 - 5.6 %    Comment: (NOTE) Pre diabetes:          5.7%-6.4% Diabetes:               >6.4% Glycemic control  for   <7.0% adults with diabetes    Mean Plasma Glucose 159.94 mg/dL    Comment: Performed at [facility], [place] [address] [place], [place] [address]  SARS Coronavirus 2 [facility] order, Performed in [facility] hospital lab) Nasopharyngeal Nasopharyngeal Swab     Status: None   Collection Time: 09/02/19 11:54 AM   Specimen: Nasopharyngeal Swab  Result Value Ref Range   SARS Coronavirus 2 NEGATIVE NEGATIVE    Comment: (NOTE) If result is NEGATIVE SARS-CoV-2 target nucleic acids are NOT DETECTED. The SARS-CoV-2 RNA is generally detectable in upper and lower  respiratory specimens during the acute phase of infection. The lowest  concentration of SARS-CoV-2 viral copies this assay can detect is 250  copies / mL. A negative result does not preclude SARS-CoV-2 infection  and should not be used as the sole basis for treatment or other  patient management decisions.  A negative result may occur with  improper specimen collection / handling, submission of specimen other  than nasopharyngeal swab, presence of viral mutation(s) within the  areas targeted by this assay, and inadequate number of viral copies  (<250 copies / mL). A negative result must be combined with clinical  observations, patient history, and epidemiological information. If result is POSITIVE SARS-CoV-2 target nucleic acids are DETECTED. The SARS-CoV-2 RNA is generally detectable in upper and lower  respiratory specimens dur ing the acute phase of infection.  Positive  results are indicative of active infection with SARS-CoV-2.  Clinical  correlation with patient history and other diagnostic information is  necessary to determine patient infection status.  Positive results do  not rule out bacterial infection or co-infection with other viruses. If result is PRESUMPTIVE POSTIVE SARS-CoV-2 nucleic acids MAY BE PRESENT.   A presumptive positive result was obtained on the submitted  specimen  and confirmed on repeat testing.  While 2019 novel coronavirus  (SARS-CoV-2) nucleic acids may be present in the submitted sample  additional confirmatory testing may be necessary for epidemiological  and / or clinical management purposes  to differentiate between  SARS-CoV-2 and other Sarbecovirus currently known to infect humans.  If clinically indicated additional testing with an alternate test  methodology [phone]) is advised. The SARS-CoV-2 RNA is generally  detectable in upper and lower respiratory sp ecimens during the acute  phase of infection. The expected result is Negative. Fact Sheet for Patients:  StrictlyIdeas.no Fact Sheet for Healthcare Providers: BankingDealers.co.za This test is not yet approved or cleared by the [place] FDA and has been authorized for detection and/or diagnosis of SARS-CoV-2 by FDA under an Emergency Use Authorization (EUA).  This EUA will remain in effect (meaning this test can be used) for the duration of the COVID-19 declaration under Section 564(b)(1) of the Act, 21 U.S.C. section 360bbb-3(b)(1), unless the authorization is terminated or revoked sooner. Performed at [facility], [place] [address], [place], [place] [address]   CBG monitoring, ED     Status: Abnormal   Collection Time: 09/02/19  4:40 PM  Result Value Ref Range   Glucose-Capillary 171 (H) 70 - 99 mg/dL  Hemoglobin and hematocrit, blood     Status: Abnormal   Collection Time: 09/02/19  5:01 PM  Result Value Ref Range   Hemoglobin 11.4 (L) 13.0 - 17.0 g/dL   HCT 35.7 (L) 39.0 - 52.0 %    Comment: Performed at [facility], [place] [address] [place], [place] [address]   No results found.  Pending  Labs [employer] (From admission, onward)    Start     Ordered   09/03/19 4035  Basic metabolic panel  Tomorrow morning,   R     09/02/19 1426   09/03/19 0500  CBC  Tomorrow  morning,   R     09/02/19 1426   09/03/19 0500  Hemoglobin A1c  Tomorrow morning,   R     09/02/19 1426   09/03/19 0500  Protime-INR  Tomorrow morning,   R     09/02/19 1426   09/03/19 0500  APTT  Tomorrow morning,   R     09/02/19 1426   09/02/19 1600  Hemoglobin and hematocrit, blood  Now then every 8 hours,   R (with STAT occurrences)     09/02/19 1426          Vitals/Pain Today's Vitals   09/02/19 1231 09/02/19 1330 09/02/19 1541 09/02/19 1847  BP: (!) 186/77 (!) 191/43 (!) 181/69 (!) 160/42  Pulse: 86 84 85 73  Resp: 18 16 18 16   Temp:      TempSrc:      SpO2: 100% 96% 96% 97%  PainSc:        Isolation Precautions No active isolations  Medications Medications  atorvastatin (LIPITOR) tablet 80 mg (80 mg Oral Given 09/02/19 1544)  metoprolol succinate (TOPROL-XL) 24 hr tablet 50 mg (50 mg Oral Given 09/02/19 1544)  acetaminophen (TYLENOL) tablet 650 mg (has no administration in time range)    Or  acetaminophen (TYLENOL) suppository 650 mg (has no administration in time range)  oxyCODONE (Oxy IR/ROXICODONE) immediate release tablet 5 mg (has no administration in time range)  ondansetron (ZOFRAN) tablet 4 mg (has no administration in time range)    Or  ondansetron (ZOFRAN) injection 4 mg (has no administration in time range)  albuterol (PROVENTIL) (2.5 MG/3ML) 0.083% nebulizer solution 2.5 mg (has no administration in time range)  insulin aspart (novoLOG) injection 0-9 Units (2 Units Subcutaneous Given 09/02/19 1700)  insulin aspart (novoLOG) injection 0-5 Units (has no administration in time range)  peg 3350 powder (MOVIPREP) kit 100 g (100 g Oral Given 09/02/19 1543)    And  peg 3350 powder (MOVIPREP) kit 100 g (0 g Oral Hold 09/02/19 1840)    Mobility walks

## 2019-09-02 NOTE — Telephone Encounter (Signed)
Colonoscopy with removal of 4 polyps, measuring up to 72mm and EGD with gastritis Wednesday. Started Thursday with multiple bloody bowel movements with clots, slowed Friday morning, but then around 8 pm last night started again with bloody BM which have continued into today. Patient is on Coumadin, took Lovenox Wednesday after procedure and then Coumadin Thursday and Friday. Did not take any today.   Advised patient to proceed to the ER. He is going to come into [facility]. Do not take Coumadin today.  Please call our service when patient arrives. I am on call till 12:30- then Dr Loletha Carrow will be taking calls through the evening. Patient will need hgb q4 hours, transfusion as needed <7, Pt/INR and will likely be re-prepped for repeat colonoscopy.   Ellouise Newer, PA-C [facility]

## 2019-09-02 NOTE — Consult Note (Addendum)
Consultation  Referring Provider: Dr. Tamera Punt    Primary Care Physician:  Shon Baton, MD Primary Gastroenterologist:   Dr. Fuller Plan      Reason for Consultation: Hematochezia status post colonoscopy 5 days ago            HPI:   [name] [name] is a 73 y.o. male with a past medical history as listed below including aortic stenosis status post [facility] Jude mechanical aortic valve on chronic Coumadin, who presents to the ER today with hematochezia.    Today, the patient explains that he had his colonoscopy and endoscopy on Wednesday.  He restarted his Lovenox that night per recommendations and then restarted Coumadin the next day.  He did start seeing some bright red blood with bowel movements on Thursday, 08/31/2019, they seemed to slow and even stop into Friday, but yesterday evening around 8 PM they started back with more clots and have gotten worse today.  Describes at least 12 bloody bowel movements since time of colonoscopy with the last one this morning.  Patient's last dose of Coumadin was last night.   Denies fever, chills, SOB, palpitations or abdominal pain.     Recent GI history: 08/30/2019 colonoscopy with Dr. Fuller Plan - One 8 mm polyp in the cecum, removed with a cold snare. Resected and retrieved. - One 10 mm polyp in the cecum, removed with a hot snare. Resected and retrieved. - One 8 mm polyp in the transverse colon, removed with a hot snare. Resected and retrieved- One 8 mm polyp in the transverse colon, removed with a hot snare. Resected and retrieved. - One 6 mm polyp in the descending colon, removed with a cold snare. Resected and retrieved.  (Per pictures 2 clips placed) - Moderate diverticulosis in the entire examined colon. - Internal hemorrhoids. - The examination was otherwise normal on direct and retroflexion views.  08/30/19 EGD with Dr. Fuller Plan - Normal esophagus. - Gastritis. Biopsied. - Normal duodenal bulb and second portion of the duodenum.  Past Medical  History:  Diagnosis Date  . Adenomatous colon polyp 09/1997  . Anemia   . Aortic stenosis    s/p [facility] mechanical AVR - Chronic Coumadin  . Blood transfusion    "related to ITP"  . Coronary artery disease    s/p cabg x 3 11/2003: lima-lad, seq vg to rpda and rpl  . Diverticulitis of colon   . Heart murmur   . Hyperlipidemia   . Hypertension   . ITP (idiopathic thrombocytopenic purpura)   . Peripheral arterial disease ([place])    a. history of aortobifemoral bypass grafting by Dr. Sherren Mocha early b. LE angiography 04/22/2015 patent aortobifem graft, DES to R SFA  . Peripheral vascular disease ([place])    s/p Left external Iliac Artery stenting and subsequent left femoral endarterectomy 02/2011- post op course complicated by wound infxn req I&D 03/2011  . Renal artery stenosis, native, bilateral (HCC)    a. bilateral renal artery stenosis by recent duplex ultrasound b. L renal artery stent 02/2015, R renal artery patent on angiogram  . Stroke ([place])   . TIA (transient ischemic attack) ~ 2013  . Type II diabetes mellitus ([place])     Past Surgical History:  Procedure Laterality Date  . ABDOMINAL AORTAGRAM N/A 12/16/2011   Procedure: ABDOMINAL Maxcine Ham;  Surgeon: Sherren Mocha, MD;  Location: [facility] CATH LAB;  Service: Cardiovascular;  Laterality: N/A;  . ANGIOPLASTY / STENTING ILIAC     Left external Iliac Artery  .  AORTA - BILATERAL FEMORAL ARTERY BYPASS GRAFT  01/18/2012   Procedure: AORTA BIFEMORAL BYPASS GRAFT;  Surgeon: Curt Jews, MD;  Location: [place];  Service: Vascular;  Laterality: N/A;  . AORTIC VALVE REPLACEMENT  ~ 2004  . CARDIAC CATHETERIZATION  11/2003   /pt report 10/01/2016  . CARDIAC VALVE REPLACEMENT  11/2003   aortic  . CATARACT EXTRACTION W/ INTRAOCULAR LENS  IMPLANT, BILATERAL Bilateral   . COLONOSCOPY    . CORONARY ARTERY BYPASS GRAFT  11/2003   Archie Endo 04/21/2011  . ESOPHAGOGASTRODUODENOSCOPY (EGD) WITH PROPOFOL N/A 02/27/2019   Procedure: ESOPHAGOGASTRODUODENOSCOPY (EGD) WITH  PROPOFOL;  Surgeon: Doran Stabler, MD;  Location: [place];  Service: Endoscopy;  Laterality: N/A;  . LOWER EXTREMITY ANGIOGRAM N/A 02/21/2015   Procedure: LOWER EXTREMITY ANGIOGRAM;  Surgeon: Lorretta Harp, MD;  Location[facility] CATH LAB;  Service: Cardiovascular;  Laterality: N/A;  . PERIPHERAL VASCULAR CATHETERIZATION N/A 04/22/2015   Procedure: Lower Extremity Angiography;  Surgeon: Lorretta Harp, MD;  Location: [place] CV LAB;  Service: Cardiovascular;  Laterality: N/A;  . PERIPHERAL VASCULAR CATHETERIZATION N/A 08/24/2016   Procedure: Lower Extremity Angiography;  Surgeon: Lorretta Harp, MD;  Location: [place] CV LAB;  Service: Cardiovascular;  Laterality: N/A;  . PERIPHERAL VASCULAR CATHETERIZATION Right 10/01/2016   Procedure: Peripheral Vascular Intervention - STENT;  Surgeon: Lorretta Harp, MD;  Location: [place] CV LAB;  Service: Cardiovascular;  Laterality: Right;  Prox and MID SFA   . POLYPECTOMY    . RENAL ANGIOGRAM N/A 02/21/2015   Procedure: RENAL ANGIOGRAM;  Surgeon: Lorretta Harp, MD;  Location: [facility] CATH LAB;  Service: Cardiovascular;  Laterality: Bilateral; 6 mm x 12 mm long Herculink balloon expandable stent to the left renal artery  . RENAL ARTERY STENT Left 04/22/2015   dr berry  . SPLENECTOMY  02/2003   Archie Endo 04/21/2011  . TONSILLECTOMY  ~ 1952    Family History  Problem Relation Age of Onset  . Coronary artery disease Mother        bypass surgery - deceased  . Heart disease Father        murmur, valve replacement - deceased  . Breast cancer Sister   . Diabetes Other        grandmother  . Diabetes Paternal Grandmother   . Diabetes Paternal Aunt   . Colon cancer Neg Hx   . Colon polyps Neg Hx   . Esophageal cancer Neg Hx   . Rectal cancer Neg Hx   . Stomach cancer Neg Hx     Social History   Tobacco Use  . Smoking status: Light Tobacco Smoker    Years: 30.00    Types: Cigarettes, Cigars    Last attempt to quit: 12/15/1993     Years since quitting: 25.7  . Smokeless tobacco: Never Used  . Tobacco comment: occasional cigar  Substance Use Topics  . Alcohol use: Yes    Alcohol/week: 16.0 standard drinks    Types: 1 Cans of beer, 1 Shots of liquor, 14 Standard drinks or equivalent per week    Comment: drinks 2 martini's a night (2 shots in each)  . Drug use: No    Prior to Admission medications   Medication Sig Start Date End Date Taking? Authorizing Provider  amLODipine (NORVASC) 10 MG tablet Take 10 mg by mouth daily. 02/11/15  Yes [provider]  atorvastatin (LIPITOR) 80 MG tablet Take 80 mg by mouth daily.    Yes [provider]  canagliflozin (INVOKANA) 100 MG TABS tablet Take 100 mg by mouth daily.   Yes [provider]  enoxaparin (LOVENOX) 80 MG/0.8ML injection Inject 0.8 mLs (80 mg total) into the skin every 12 (twelve) hours. 08/11/19  Yes Josue Hector, MD  Evolocumab (REPATHA SURECLICK) 301 MG/ML SOAJ Inject 140 mg into the skin every 14 (fourteen) days.   Yes [provider]  glyBURIDE (DIABETA) 2.5 MG tablet Take 5 mg by mouth 2 (two) times daily with a meal.    Yes [provider]  hydrochlorothiazide (MICROZIDE) 12.5 MG capsule TAKE 1 CAPSULE BY MOUTH EVERY DAY Patient taking differently: Take 12.5 mg by mouth daily.  02/02/19  Yes Josue Hector, MD  metoprolol succinate (TOPROL-XL) 50 MG 24 hr tablet TAKE 1 TABLET EVERY DAY WITH OR IMMEDIATELY FOLLOWING A MEAL Patient taking differently: Take 50 mg by mouth daily.  07/20/19  Yes Lorretta Harp, MD  trandolapril ([place]) 4 MG tablet TAKE 1 TABLET BY MOUTH EVERY DAY 07/20/19  Yes Lorretta Harp, MD  warfarin (COUMADIN) 10 MG tablet TAKE AS DIRECTED BY COUMADIN CLINIC Patient taking differently: Take 5-10 mg by mouth See admin instructions. Take 10mg  by mouth everyday Take 5mg  by mouth on fridays 07/14/18  Yes Josue Hector, MD  pantoprazole (PROTONIX) 40 MG tablet Take 1 tablet (40 mg total) by mouth  daily for 30 days. Patient not taking: Reported on 09/02/2019 03/22/19 08/18/19  Ladene Artist, MD    No current facility-administered medications for this encounter.    Current Outpatient Medications  Medication Sig Dispense Refill  . amLODipine (NORVASC) 10 MG tablet Take 10 mg by mouth daily.  5  . atorvastatin (LIPITOR) 80 MG tablet Take 80 mg by mouth daily.     . canagliflozin (INVOKANA) 100 MG TABS tablet Take 100 mg by mouth daily.    [name] enoxaparin (LOVENOX) 80 MG/0.8ML injection Inject 0.8 mLs (80 mg total) into the skin every 12 (twelve) hours. 20 mL 1  . Evolocumab (REPATHA SURECLICK) 601 MG/ML SOAJ Inject 140 mg into the skin every 14 (fourteen) days.    [name] glyBURIDE (DIABETA) 2.5 MG tablet Take 5 mg by mouth 2 (two) times daily with a meal.     . hydrochlorothiazide (MICROZIDE) 12.5 MG capsule TAKE 1 CAPSULE BY MOUTH EVERY DAY (Patient taking differently: Take 12.5 mg by mouth daily. ) 90 capsule 3  . metoprolol succinate (TOPROL-XL) 50 MG 24 hr tablet TAKE 1 TABLET EVERY DAY WITH OR IMMEDIATELY FOLLOWING A MEAL (Patient taking differently: Take 50 mg by mouth daily. ) 90 tablet 1  . trandolapril (MAVIK) 4 MG tablet TAKE 1 TABLET BY MOUTH EVERY DAY 90 tablet 2  . warfarin (COUMADIN) 10 MG tablet TAKE AS DIRECTED BY COUMADIN CLINIC (Patient taking differently: Take 5-10 mg by mouth See admin instructions. Take 10mg  by mouth everyday Take 5mg  by mouth on fridays) 35 tablet 3  . pantoprazole (PROTONIX) 40 MG tablet Take 1 tablet (40 mg total) by mouth daily for 30 days. (Patient not taking: Reported on 09/02/2019) 30 tablet 11    Allergies as of 09/02/2019  . (No Known Allergies)     Review of Systems:    Constitutional: No weight loss, fever or chills Skin: No rash  Cardiovascular: No chest pain Respiratory: No SOB Gastrointestinal: See HPI and otherwise negative Genitourinary: No dysuria  Neurological: No headache, dizziness or syncope Musculoskeletal: No new muscle or  joint pain Hematologic: [place] Psychiatric: No history of depression  or anxiety    Physical Exam:  Vital signs in last 24 hours: Temp:  [98.4 F (36.9 C)] 98.4 F (36.9 C) (09/26 0945) Pulse Rate:  [92] 92 (09/26 0945) Resp:  [19] 19 (09/26 0945) BP: (140)/(115) 140/115 (09/26 0945) SpO2:  [99 %] 99 % (09/26 0945)   General:   Pleasant Caucasian male appears to be in NAD, Well developed, Well nourished, alert and cooperative Head:  Normocephalic and atraumatic. Eyes:   PEERL, EOMI. No icterus. Conjunctiva pink. Ears:  Normal auditory acuity. Neck:  Supple Throat: Oral cavity and pharynx without inflammation, swelling or lesion.  Lungs: Respirations even and unlabored. Lungs clear to auscultation bilaterally.   No wheezes, crackles, or rhonchi.  Heart: Normal S1, S2. No MRG. Regular rate and rhythm. No peripheral edema, cyanosis or pallor.  Abdomen:  Soft, nondistended, nontender. No rebound or guarding. Normal bowel sounds. No appreciable masses or hepatomegaly. Rectal:  Not performed.  Msk:  Symmetrical without gross deformities. Peripheral pulses intact.  Extremities:  Without edema, no deformity or joint abnormality.  Neurologic:  Alert and  oriented x4;  grossly normal neurologically.  Skin:   Dry and intact without significant lesions or rashes. Psychiatric: Demonstrates good judgement and reason without abnormal affect or behaviors.   LAB RESULTS: Recent Labs    09/02/19 1008  WBC 6.9  HGB 11.8*  HCT 36.8*  PLT 169   BMET Recent Labs    09/02/19 1008  NA 138  K 4.2  CL 103  CO2 25  GLUCOSE 210*  BUN 21  CREATININE 0.81  CALCIUM 8.9   LFT Recent Labs    09/02/19 1008  PROT 6.4*  ALBUMIN 4.0  AST 23  ALT 25  ALKPHOS 50  BILITOT 0.6   PT/INR Recent Labs    09/02/19 1008  LABPROT 18.7*  INR 1.6*    Impression / Plan:   Impression: 1.  Hematochezia: Most likely post polypectomy bleed after colonoscopy Wednesday, 08/30/2019 with removal of  4 polyps, patient was initially on Lovenox changed to Coumadin 08/31/2019, last dose 9/20 5 PM, hemoglobin 11.8 at time of admission 2.  Chronic anticoagulation: On Coumadin for mechanical heart valve  Plan: 1.  Patient will be ok off of anticoagulation overnight. Please d/c all anticoagulants. Will likely start back on heparin after time of procedure 2.  We will go ahead and plan for repeat colonoscopy tomorrow  3.  Scheduled patient for colonoscopy tomorrow with Dr. Loletha Carrow. 4.  Will start movi prep today, patient can be on clears and n.p.o. after midnight 5.  Please await any further recommendations from Dr. Loletha Carrow later today  Thank you for your kind consultation, we will continue to follow.  Lavone Nian [facility]  09/02/2019, 11:35 AM  I have reviewed the entire case in detail with the above APP and discussed the plan in detail.  Therefore, I agree with the diagnoses recorded above. In addition,  I have personally interviewed and examined the patient and have personally reviewed any abdominal/pelvic CT scan images.  My additional thoughts are as follows:  Post polypectomy bleeding after recent outpatient colonoscopy.  Increased risk situation with patient on anticoagulation for mechanical aortic valve.  He actually took last both of both Lovenox and Coumadin last evening.  Although it is not noted in the body of Dr. Lynne Leader recent colonoscopy report, the photographs show the 2 clips were placed on the left colon polypectomy site.  Nevertheless, any of the polypectomy sites could be the source at  this point.  He is hemodynamically stable, but has anemia of acute GI blood loss.  The plan is for a bowel preparation tonight and a colonoscopy with me tomorrow morning.  Endoscopic hemostatic therapy will be applied to any necessary polypectomy sites.  He will then need to go back on unfractionated heparin later that day, with exact plans to follow with the procedure report.  I discussed with him  the risk of being off anticoagulation in the setting of mechanical aortic valve.  It is a relatively higher flow valve compared to a mitral valve, thus current practice is that we must accept that risk while we get the bleeding under control because it is a greater threat to his life then valve clot and stroke.  He is agreeable to a colonoscopy tomorrow after risks and benefits were reviewed.  The benefits and risks of the planned procedure were described in detail with the patient or (when appropriate) their health care proxy.  Risks were outlined as including, but not limited to, bleeding, infection, perforation, adverse medication reaction leading to cardiac or pulmonary decompensation, pancreatitis (if ERCP).  The limitation of incomplete mucosal visualization was also discussed.  No guarantees or warranties were given.  Patient at increased risk for cardiopulmonary complications of procedure due to medical comorbidities.  Serial hemoglobin and hematocrit, transfuse as needed.   [name] [phone]

## 2019-09-02 NOTE — H&P (Signed)
History and Physical    [name] [name] EPP:[id number] DOB: 11-29-1946 DOA: 09/02/2019  PCP: Shon Baton, MD Patient coming from: Home  Chief Complaint: Rectal bleed  HPI: [name] [name] is a 73 y.o. male with history of colon polyps, diverticulosis, aortic stenosis s/p mechanical valve on Coumadin for anticoagulation, CAD/CABG 2014, PAD/stent and endarterectomies 2012, left renal stent 2016, HTN, ITP and NIDDM-2 presenting with the above chief complaints.  Patient had EGD and colonoscopy on 08/30/2019 that revealed some polyps, moderate diverticulosis, internal hemorrhoid and gastritis.  Polyps were resected.  He was resumed on warfarin with Lovenox bridge.  Noted clotted dark and red blood in toilet on Thursday (9/24) about 3 PM.  Bleeding is not of Friday morning but returned Friday night and worse this morning that prompted him to come to ED.  Last dose of Lovenox and Coumadin was yesterday.  He denies NSAID use. Had similar GI bleed in March 2020.   Patient denies recent illness, fever, chills, cough, shortness of breath, chest pain, palpitation, nausea, vomiting, abdominal pain, UTI symptoms or focal neuro symptoms.  He had mild lightheadedness this morning.  Lives with his cat.  Reports smoking cigar when he plays golf.  Drinks about 2 beers a day.  Denies recreational drug use.  In ED, hemodynamically stable.  Hgb 11.8 (baseline reportedly 14-15 although his Hgb in 3/20 was 8.2).  Glucose 210.  Otherwise, CBC and CMP not impressive.  PT/INR 18.7/1.6.  Type and screen.  GI consulted and evaluated patient.  Hospitalist service called for admission.  GI planning repeat colonoscopy tomorrow.  COVID-19 scan pending.  ROS All review of system negative except for pertinent positives and negatives as history of present illness above. PMH Past Medical History:  Diagnosis Date   Adenomatous colon polyp 09/1997   Anemia    Aortic stenosis    s/p st. jude mechanical AVR -  Chronic Coumadin   Blood transfusion    "related to ITP"   Coronary artery disease    s/p cabg x 3 11/2003: lima-lad, seq vg to rpda and rpl   Diverticulitis of colon    Heart murmur    Hyperlipidemia    Hypertension    ITP (idiopathic thrombocytopenic purpura)    Peripheral arterial disease ([place])    a. history of aortobifemoral bypass grafting by Dr. Sherren Mocha early b. LE angiography 04/22/2015 patent aortobifem graft, DES to R SFA   Peripheral vascular disease ([place])    s/p Left external Iliac Artery stenting and subsequent left femoral endarterectomy 02/2011- post op course complicated by wound infxn req I&D 03/2011   Renal artery stenosis, native, bilateral ([place])    a. bilateral renal artery stenosis by recent duplex ultrasound b. L renal artery stent 02/2015, R renal artery patent on angiogram   Stroke [facility])    TIA (transient ischemic attack) ~ 2013   Type II diabetes mellitus ([place])    [place] Past Surgical History:  Procedure Laterality Date   ABDOMINAL AORTAGRAM N/A 12/16/2011   Procedure: ABDOMINAL Maxcine Ham;  Surgeon: Sherren Mocha, MD;  Location: [facility] CATH LAB;  Service: Cardiovascular;  Laterality: N/A;   ANGIOPLASTY / STENTING ILIAC     Left external Iliac Artery   AORTA - BILATERAL FEMORAL ARTERY BYPASS GRAFT  01/18/2012   Procedure: AORTA BIFEMORAL BYPASS GRAFT;  Surgeon: Curt Jews, MD;  Location: [place];  Service: Vascular;  Laterality: N/A;   AORTIC VALVE REPLACEMENT  ~ 2004   CARDIAC CATHETERIZATION  11/2003   /pt report  10/01/2016   CARDIAC VALVE REPLACEMENT  11/2003   aortic   CATARACT EXTRACTION W/ INTRAOCULAR LENS  IMPLANT, BILATERAL Bilateral    COLONOSCOPY     CORONARY ARTERY BYPASS GRAFT  11/2003   Archie Endo 04/21/2011   ESOPHAGOGASTRODUODENOSCOPY (EGD) WITH PROPOFOL N/A 02/27/2019   Procedure: ESOPHAGOGASTRODUODENOSCOPY (EGD) WITH PROPOFOL;  Surgeon: Doran Stabler, MD;  Location: [place];  Service: Endoscopy;  Laterality: N/A;   LOWER  EXTREMITY ANGIOGRAM N/A 02/21/2015   Procedure: LOWER EXTREMITY ANGIOGRAM;  Surgeon: Lorretta Harp, MD;  Location: [facility] CATH LAB;  Service: Cardiovascular;  Laterality: N/A;   PERIPHERAL VASCULAR CATHETERIZATION N/A 04/22/2015   Procedure: Lower Extremity Angiography;  Surgeon: Lorretta Harp, MD;  Location: [place] CV LAB;  Service: Cardiovascular;  Laterality: N/A;   PERIPHERAL VASCULAR CATHETERIZATION N/A 08/24/2016   Procedure: Lower Extremity Angiography;  Surgeon: Lorretta Harp, MD;  Location: [place] CV LAB;  Service: Cardiovascular;  Laterality: N/A;   PERIPHERAL VASCULAR CATHETERIZATION Right 10/01/2016   Procedure: Peripheral Vascular Intervention - STENT;  Surgeon: Lorretta Harp, MD;  Location: [place] CV LAB;  Service: Cardiovascular;  Laterality: Right;  Prox and MID SFA    POLYPECTOMY     RENAL ANGIOGRAM N/A 02/21/2015   Procedure: RENAL ANGIOGRAM;  Surgeon: Lorretta Harp, MD;  Location: [facility] CATH LAB;  Service: Cardiovascular;  Laterality: Bilateral; 6 mm x 12 mm long Herculink balloon expandable stent to the left renal artery   RENAL ARTERY STENT Left 04/22/2015   dr berry   SPLENECTOMY  02/2003   Archie Endo 04/21/2011   TONSILLECTOMY  ~ 1952   Fam HX Family History  Problem Relation Age of Onset   Coronary artery disease Mother        bypass surgery - deceased   Heart disease Father        murmur, valve replacement - deceased   Breast cancer Sister    Diabetes Other        grandmother   Diabetes Paternal Grandmother    Diabetes Paternal Aunt    Colon cancer Neg Hx    Colon polyps Neg Hx    Esophageal cancer Neg Hx    Rectal cancer Neg Hx    Stomach cancer Neg Hx    Social Hx  reports that he has been smoking cigarettes and cigars. He has smoked for the past 30.00 years. He has never used smokeless tobacco. He reports current alcohol use of about 16.0 standard drinks of alcohol per week. He reports that he does not use  drugs.  Allergy No Known Allergies Home Meds Prior to Admission medications   Medication Sig Start Date End Date Taking? Authorizing Provider  amLODipine (NORVASC) 10 MG tablet Take 10 mg by mouth daily. 02/11/15  Yes [provider]  atorvastatin (LIPITOR) 80 MG tablet Take 80 mg by mouth daily.    Yes [provider]  canagliflozin (INVOKANA) 100 MG TABS tablet Take 100 mg by mouth daily.   Yes [provider]  enoxaparin (LOVENOX) 80 MG/0.8ML injection Inject 0.8 mLs (80 mg total) into the skin every 12 (twelve) hours. 08/11/19  Yes Josue Hector, MD  Evolocumab (REPATHA SURECLICK) 720 MG/ML SOAJ Inject 140 mg into the skin every 14 (fourteen) days.   Yes [provider]  glyBURIDE (DIABETA) 2.5 MG tablet Take 5 mg by mouth 2 (two) times daily with a meal.    Yes [provider]  hydrochlorothiazide (MICROZIDE) 12.5 MG capsule TAKE 1  CAPSULE BY MOUTH EVERY DAY Patient taking differently: Take 12.5 mg by mouth daily.  02/02/19  Yes Josue Hector, MD  metoprolol succinate (TOPROL-XL) 50 MG 24 hr tablet TAKE 1 TABLET EVERY DAY WITH OR IMMEDIATELY FOLLOWING A MEAL Patient taking differently: Take 50 mg by mouth daily.  07/20/19  Yes Lorretta Harp, MD  trandolapril ([place]) 4 MG tablet TAKE 1 TABLET BY MOUTH EVERY DAY 07/20/19  Yes Lorretta Harp, MD  warfarin (COUMADIN) 10 MG tablet TAKE AS DIRECTED BY COUMADIN CLINIC Patient taking differently: Take 5-10 mg by mouth See admin instructions. Take 10mg  by mouth everyday Take 5mg  by mouth on fridays 07/14/18  Yes Josue Hector, MD  pantoprazole (PROTONIX) 40 MG tablet Take 1 tablet (40 mg total) by mouth daily for 30 days. Patient not taking: Reported on 09/02/2019 03/22/19 08/18/19  Ladene Artist, MD    Physical Exam: Vitals:   09/02/19 0945 09/02/19 1231  BP: (!) 140/115 (!) 186/77  Pulse: 92 86  Resp: 19 18  Temp: 98.4 F (36.9 C)   TempSrc: Oral   SpO2: 99% 100%    GENERAL: No acute  distress.  Appears well.  HEENT: MMM.  Vision and hearing grossly intact.  NECK: Supple.  No apparent JVD.  RESP:  No IWOB. Good air movement bilaterally. CVS:  RRR.  Mechanical heart sound. ABD/GI/GU: Bowel sounds present. Soft. Non tender.  MSK/EXT:  Moves extremities. No apparent deformity or edema.  SKIN: no apparent skin lesion or wound NEURO: Awake, alert and oriented appropriately.  No gross deficit.  PSYCH: Calm. Normal affect.   Personally Reviewed Radiological Exams No results found.   Personally Reviewed Labs: CBC: Recent Labs  Lab 09/02/19 1008  WBC 6.9  HGB 11.8*  HCT 36.8*  MCV 91.5  PLT 979   Basic Metabolic Panel: Recent Labs  Lab 09/02/19 1008  NA 138  K 4.2  CL 103  CO2 25  GLUCOSE 210*  BUN 21  CREATININE 0.81  CALCIUM 8.9   GFR: Estimated Creatinine Clearance: 78.6 mL/min (by C-G formula based on SCr of 0.81 mg/dL). Liver Function Tests: Recent Labs  Lab 09/02/19 1008  AST 23  ALT 25  ALKPHOS 50  BILITOT 0.6  PROT 6.4*  ALBUMIN 4.0   No results for input(s): LIPASE, AMYLASE in the last 168 hours. No results for input(s): AMMONIA in the last 168 hours. Coagulation Profile: Recent Labs  Lab 09/02/19 1008  INR 1.6*   Cardiac Enzymes: No results for input(s): CKTOTAL, CKMB, CKMBINDEX, TROPONINI in the last 168 hours. BNP (last 3 results) No results for input(s): PROBNP in the last 8760 hours. HbA1C: No results for input(s): HGBA1C in the last 72 hours. CBG: No results for input(s): GLUCAP in the last 168 hours. Lipid Profile: No results for input(s): CHOL, HDL, LDLCALC, TRIG, CHOLHDL, LDLDIRECT in the last 72 hours. Thyroid Function Tests: No results for input(s): TSH, T4TOTAL, FREET4, T3FREE, THYROIDAB in the last 72 hours. Anemia Panel: No results for input(s): VITAMINB12, FOLATE, FERRITIN, TIBC, IRON, RETICCTPCT in the last 72 hours. Urine analysis:    Component Value Date/Time   COLORURINE STRAW (A) 12/20/2017 1924    APPEARANCEUR CLEAR 12/20/2017 1924   LABSPEC 1.020 12/20/2017 1924   PHURINE 7.0 12/20/2017 1924   GLUCOSEU >=500 (A) 12/20/2017 1924   HGBUR NEGATIVE 12/20/2017 1924   BILIRUBINUR NEGATIVE 12/20/2017 1924   KETONESUR 5 (A) 12/20/2017 1924   PROTEINUR NEGATIVE 12/20/2017 1924   UROBILINOGEN 0.2 01/15/2012 1415  NITRITE NEGATIVE 12/20/2017 1924   LEUKOCYTESUR NEGATIVE 12/20/2017 1924    Sepsis Labs:  None  Personally Reviewed EKG:  None  Assessment/Plan Rectal bleed: Likely diverticular.  Painless.  Hemodynamically stable.   -Hgb 11.8-baseline reportedly 14-15 per patient  -Colonoscopy on 9/23 -polyps, diverticulosis and internal hemorrhoid.  EGD at the same time revealed gastritis. -GI consulted by EDP-plan for colonoscopy 9/27 -Clear liquid diet.  N.p.o. after midnight -H&H every 8 hours -Hold warfarin and Lovenox.  -Type and screen in ED.  Maintain 2 peripheral IV lines.  History of AS/MV on coumadin for anticoagulation -Hold Coumadin and Lovenox.  History of CAD/CABG in 2014: Stable.  No anginal symptoms. -Continue home medications  PAD/renal stenosis/stents: good DP pulses bilaterally. -Continue home medications  HTN: DBP 111 on admission. -Continue home metoprolol -Hold home amlodipine and HCTZ and reinitiate as appropriate.  NIDDM-2 with hyperglycemia: Not in DKA. -Check hemoglobin A1c -SSI-thin -Continue home statin  History of ITP/splenectomy: Platelet 169. -Continue monitoring  DVT prophylaxis: SCD  Code Status: DNR/DNI Family Communication: Patient declined.  Disposition Plan: Admit to telemetry Consults called: GI by EDP Admission status: Observation status.   Mercy Riding MD [facility]  If 7PM-7AM, please contact night-coverage [email or web address] Password [facility]  09/02/2019, 12:42 PM

## 2019-09-02 NOTE — ED Notes (Signed)
Attempted to call report again and was transferred to charge RN who informed this RN that they were unaware they were getting this patient. Was told to call back in 5 minutes.

## 2019-09-02 NOTE — ED Notes (Signed)
Attempted to call reports, staff informed RN they would call back in a few minutes to receive report.

## 2019-09-03 ENCOUNTER — Encounter (HOSPITAL_COMMUNITY)
Admission: EM | Disposition: A | Discharge status: Home / Self Care | Payer: Self-pay | Source: Home / Self Care | Attending: Internal Medicine

## 2019-09-03 ENCOUNTER — Observation Stay (HOSPITAL_COMMUNITY): Payer: Medicare Other | Admitting: Certified Registered Nurse Anesthetist

## 2019-09-03 ENCOUNTER — Encounter (HOSPITAL_COMMUNITY): Payer: Self-pay | Admitting: *Deleted

## 2019-09-03 DIAGNOSIS — K633 Ulcer of intestine: Secondary | ICD-10-CM | POA: Diagnosis not present

## 2019-09-03 DIAGNOSIS — Z961 Presence of intraocular lens: Secondary | ICD-10-CM | POA: Diagnosis present

## 2019-09-03 DIAGNOSIS — K5730 Diverticulosis of large intestine without perforation or abscess without bleeding: Secondary | ICD-10-CM | POA: Diagnosis not present

## 2019-09-03 DIAGNOSIS — Z8673 Personal history of transient ischemic attack (TIA), and cerebral infarction without residual deficits: Secondary | ICD-10-CM | POA: Diagnosis not present

## 2019-09-03 DIAGNOSIS — K5732 Diverticulitis of large intestine without perforation or abscess without bleeding: Secondary | ICD-10-CM | POA: Diagnosis not present

## 2019-09-03 DIAGNOSIS — Z951 Presence of aortocoronary bypass graft: Secondary | ICD-10-CM | POA: Diagnosis not present

## 2019-09-03 DIAGNOSIS — K91840 Postprocedural hemorrhage and hematoma of a digestive system organ or structure following a digestive system procedure: Secondary | ICD-10-CM | POA: Diagnosis not present

## 2019-09-03 DIAGNOSIS — Z7901 Long term (current) use of anticoagulants: Secondary | ICD-10-CM | POA: Diagnosis not present

## 2019-09-03 DIAGNOSIS — Z9842 Cataract extraction status, left eye: Secondary | ICD-10-CM | POA: Diagnosis not present

## 2019-09-03 DIAGNOSIS — Y838 Other surgical procedures as the cause of abnormal reaction of the patient, or of later complication, without mention of misadventure at the time of the procedure: Secondary | ICD-10-CM | POA: Diagnosis present

## 2019-09-03 DIAGNOSIS — Z95820 Peripheral vascular angioplasty status with implants and grafts: Secondary | ICD-10-CM | POA: Diagnosis not present

## 2019-09-03 DIAGNOSIS — K2970 Gastritis, unspecified, without bleeding: Secondary | ICD-10-CM | POA: Diagnosis not present

## 2019-09-03 DIAGNOSIS — K648 Other hemorrhoids: Secondary | ICD-10-CM | POA: Diagnosis not present

## 2019-09-03 DIAGNOSIS — Z9081 Acquired absence of spleen: Secondary | ICD-10-CM | POA: Diagnosis not present

## 2019-09-03 DIAGNOSIS — K625 Hemorrhage of anus and rectum: Secondary | ICD-10-CM

## 2019-09-03 DIAGNOSIS — E1151 Type 2 diabetes mellitus with diabetic peripheral angiopathy without gangrene: Secondary | ICD-10-CM | POA: Diagnosis not present

## 2019-09-03 DIAGNOSIS — Z66 Do not resuscitate: Secondary | ICD-10-CM | POA: Diagnosis not present

## 2019-09-03 DIAGNOSIS — E1165 Type 2 diabetes mellitus with hyperglycemia: Secondary | ICD-10-CM | POA: Diagnosis present

## 2019-09-03 DIAGNOSIS — E785 Hyperlipidemia, unspecified: Secondary | ICD-10-CM | POA: Diagnosis present

## 2019-09-03 DIAGNOSIS — Z952 Presence of prosthetic heart valve: Secondary | ICD-10-CM | POA: Diagnosis not present

## 2019-09-03 DIAGNOSIS — Z9841 Cataract extraction status, right eye: Secondary | ICD-10-CM | POA: Diagnosis not present

## 2019-09-03 DIAGNOSIS — Z9889 Other specified postprocedural states: Secondary | ICD-10-CM | POA: Diagnosis not present

## 2019-09-03 DIAGNOSIS — I2510 Atherosclerotic heart disease of native coronary artery without angina pectoris: Secondary | ICD-10-CM | POA: Diagnosis not present

## 2019-09-03 DIAGNOSIS — Z862 Personal history of diseases of the blood and blood-forming organs and certain disorders involving the immune mechanism: Secondary | ICD-10-CM | POA: Diagnosis not present

## 2019-09-03 DIAGNOSIS — Z20828 Contact with and (suspected) exposure to other viral communicable diseases: Secondary | ICD-10-CM | POA: Diagnosis not present

## 2019-09-03 DIAGNOSIS — D62 Acute posthemorrhagic anemia: Secondary | ICD-10-CM | POA: Diagnosis not present

## 2019-09-03 DIAGNOSIS — Z86010 Personal history of colonic polyps: Secondary | ICD-10-CM | POA: Diagnosis not present

## 2019-09-03 DIAGNOSIS — I10 Essential (primary) hypertension: Secondary | ICD-10-CM | POA: Diagnosis present

## 2019-09-03 DIAGNOSIS — Z8249 Family history of ischemic heart disease and other diseases of the circulatory system: Secondary | ICD-10-CM | POA: Diagnosis not present

## 2019-09-03 DIAGNOSIS — K921 Melena: Secondary | ICD-10-CM | POA: Diagnosis not present

## 2019-09-03 HISTORY — PX: COLONOSCOPY WITH PROPOFOL: SHX5780

## 2019-09-03 HISTORY — PX: HEMOSTASIS CLIP PLACEMENT: SHX6857

## 2019-09-03 LAB — BASIC METABOLIC PANEL
Anion gap: 9 (ref 5–15)
BUN: 14 mg/dL (ref 8–23)
CO2: 26 mmol/L (ref 22–32)
Calcium: 8.6 mg/dL — ABNORMAL LOW (ref 8.9–10.3)
Chloride: 107 mmol/L (ref 98–111)
Creatinine, Ser: 0.82 mg/dL (ref 0.61–1.24)
GFR calc Af Amer: >60 mL/min (ref >60)
GFR calc non Af Amer: >60 mL/min (ref >60)
Glucose, Bld: 153 mg/dL — ABNORMAL HIGH (ref 70–99)
Potassium: 3.9 mmol/L (ref 3.5–5.1)
Sodium: 142 mmol/L (ref 135–145)

## 2019-09-03 LAB — HEMOGLOBIN AND HEMATOCRIT, BLOOD
HCT: 31.9 % — ABNORMAL LOW (ref 39.0–52.0)
HCT: 30 % — ABNORMAL LOW (ref 39.0–52.0)
Hemoglobin: 10 g/dL — ABNORMAL LOW (ref 13.0–17.0)
Hemoglobin: 9.3 g/dL — ABNORMAL LOW (ref 13.0–17.0)

## 2019-09-03 LAB — GLUCOSE, CAPILLARY
Glucose-Capillary: 139 mg/dL — ABNORMAL HIGH (ref 70–99)
Glucose-Capillary: 127 mg/dL — ABNORMAL HIGH (ref 70–99)
Glucose-Capillary: 256 mg/dL — ABNORMAL HIGH (ref 70–99)
Glucose-Capillary: 114 mg/dL — ABNORMAL HIGH (ref 70–99)

## 2019-09-03 LAB — CBC
HCT: 32.4 % — ABNORMAL LOW (ref 39.0–52.0)
Hemoglobin: 10.2 g/dL — ABNORMAL LOW (ref 13.0–17.0)
MCH: 29.2 pg (ref 26.0–34.0)
MCHC: 31.5 g/dL (ref 30.0–36.0)
MCV: 92.8 fL (ref 80.0–100.0)
Platelets: 180 10*3/uL (ref 150–400)
RBC: 3.49 MIL/uL — ABNORMAL LOW (ref 4.22–5.81)
RDW: 19.9 % — ABNORMAL HIGH (ref 11.5–15.5)
WBC: 6.3 10*3/uL (ref 4.0–10.5)
nRBC: 0 % (ref 0.0–0.2)

## 2019-09-03 LAB — APTT: aPTT: 32 seconds (ref 24–36)

## 2019-09-03 LAB — PROTIME-INR
INR: 1.5 — ABNORMAL HIGH (ref 0.8–1.2)
Prothrombin Time: 18.1 seconds — ABNORMAL HIGH (ref 11.4–15.2)

## 2019-09-03 LAB — HEMOGLOBIN A1C
Hgb A1c MFr Bld: 7.2 % — ABNORMAL HIGH (ref 4.8–5.6)
Mean Plasma Glucose: 159.94 mg/dL

## 2019-09-03 LAB — HEPARIN LEVEL (UNFRACTIONATED): Heparin Unfractionated: 0.42 IU/mL (ref 0.30–0.70)

## 2019-09-03 SURGERY — COLONOSCOPY WITH PROPOFOL
Anesthesia: Monitor Anesthesia Care

## 2019-09-03 MED ORDER — LACTATED RINGERS IV SOLN
INTRAVENOUS | Status: DC
  Administered 2019-09-03: 1000 mL via INTRAVENOUS

## 2019-09-03 MED ORDER — PROPOFOL 10 MG/ML IV BOLUS
INTRAVENOUS | Status: AC
  Filled 2019-09-03: qty 20

## 2019-09-03 MED ORDER — PROPOFOL 10 MG/ML IV BOLUS
INTRAVENOUS | Status: DC | PRN
  Administered 2019-09-03 (×7): 20 mg via INTRAVENOUS
  Administered 2019-09-03: 30 mg via INTRAVENOUS
  Administered 2019-09-03 (×8): 20 mg via INTRAVENOUS
  Administered 2019-09-03: 30 mg via INTRAVENOUS
  Administered 2019-09-03: 20 mg via INTRAVENOUS

## 2019-09-03 MED ORDER — LIDOCAINE 2% (20 MG/ML) 5 ML SYRINGE
INTRAMUSCULAR | Status: DC | PRN
  Administered 2019-09-03: 60 mg via INTRAVENOUS

## 2019-09-03 MED ORDER — HEPARIN (PORCINE) 25000 UT/250ML-% IV SOLN
1200.0000 [IU]/h | INTRAVENOUS | Status: DC
  Administered 2019-09-03 – 2019-09-04 (×2): 1200 [IU]/h via INTRAVENOUS
  Filled 2019-09-03 (×2): qty 250

## 2019-09-03 MED ORDER — SODIUM CHLORIDE 0.9 % IV SOLN: INTRAVENOUS | Status: DC

## 2019-09-03 SURGICAL SUPPLY — 22 items

## 2019-09-03 NOTE — Anesthesia Procedure Notes (Signed)
Date/Time: 09/03/2019 9:35 AM Performed by: Talbot Grumbling, CRNA Oxygen Delivery Method: Simple face mask

## 2019-09-03 NOTE — Transfer of Care (Signed)
Immediate Anesthesia Transfer of Care Note  Patient: [name] [name]  Procedure(s) Performed: COLONOSCOPY WITH PROPOFOL (N/A ) HEMOSTASIS CLIP PLACEMENT  Patient Location: PACU  Anesthesia Type:MAC  Level of Consciousness: sedated  Airway & Oxygen Therapy: Patient Spontanous Breathing and Patient connected to face mask oxygen  Post-op Assessment: Report given to RN and Post -op Vital signs reviewed and stable  Post vital signs: Reviewed and stable  Last Vitals:  Vitals Value Taken Time  BP    Temp    Pulse    Resp    SpO2      Last Pain:  Vitals:   09/03/19 0925  TempSrc: Oral  PainSc: 0-No pain         Complications: No apparent anesthesia complications

## 2019-09-03 NOTE — Progress Notes (Signed)
Colonoscopy report in chart.  No active bleeding.  Polypectomy sites clipped.  Needs unfractionated heparin started now.  Full recs in report.  Awaiting call back from Dr. Wyline Copas of [facility] to discuss.

## 2019-09-03 NOTE — Interval H&P Note (Signed)
History and Physical Interval Note:  09/03/2019 9:29 AM  [name] [name]  has presented today for surgery, with the diagnosis of Post polypectomy bleed.  The various methods of treatment have been discussed with the patient and family. After consideration of risks, benefits and other options for treatment, the patient has consented to  Procedure(s): COLONOSCOPY WITH PROPOFOL (N/A) as a surgical intervention.  The patient's history has been reviewed, patient examined, no change in status, stable for surgery.  I have reviewed the patient's chart and labs.  Questions were answered to the patient's satisfaction.    Bleeding stopped during bowel prep.  Hgb dropped to 10 ovn and stable at 10.2 this AM.  He looks well.  [name]

## 2019-09-03 NOTE — Progress Notes (Signed)
ANTICOAGULATION CONSULT NOTE - Initial Consult  Pharmacy Consult for IV heparin Indication: mechanical valve  No Known Allergies  Patient Measurements: Height: 5\' 8"  (172.7 cm) Weight: 166 lb 0.1 oz (75.3 kg) IBW/kg (Calculated) : 68.4 Heparin Dosing Weight: actual body weight   Vital Signs: Temp: 98.8 F (37.1 C) (09/27 1022) Temp Source: Tympanic (09/27 1022) BP: 164/47 (09/27 1040) Pulse Rate: 64 (09/27 1045)  Labs: Recent Labs    09/02/19 1008 09/02/19 1701 09/03/19 0051 09/03/19 0816  HGB 11.8* 11.4* 10.0* 10.2*  HCT 36.8* 35.7* 31.9* 32.4*  PLT 169  --   --  180  APTT  --   --   --  32  LABPROT 18.7*  --   --  18.1*  INR 1.6*  --   --  1.5*  CREATININE 0.81  --   --  0.82    Estimated Creatinine Clearance: 77.6 mL/min (by C-G formula based on SCr of 0.82 mg/dL).   Medical History: Past Medical History:  Diagnosis Date  . Adenomatous colon polyp 09/1997  . Anemia   . Aortic stenosis    s/p st. jude mechanical AVR - Chronic Coumadin  . Blood transfusion    "related to ITP"  . Coronary artery disease    s/p cabg x 3 11/2003: lima-lad, seq vg to rpda and rpl  . Diverticulitis of colon   . Heart murmur   . Hyperlipidemia   . Hypertension   . ITP (idiopathic thrombocytopenic purpura)   . Peripheral arterial disease ([place])    a. history of aortobifemoral bypass grafting by Dr. Sherren Mocha early b. LE angiography 04/22/2015 patent aortobifem graft, DES to R SFA  . Peripheral vascular disease ([place])    s/p Left external Iliac Artery stenting and subsequent left femoral endarterectomy 02/2011- post op course complicated by wound infxn req I&D 03/2011  . Renal artery stenosis, native, bilateral ([facility])    a. bilateral renal artery stenosis by recent duplex ultrasound b. L renal artery stent 02/2015, R renal artery patent on angiogram  . Stroke ([place])   . TIA (transient ischemic attack) ~ 2013  . Type II diabetes mellitus ([facility])     Assessment: 41 y/oM with PMH of stroke,  St. Jude mechanical aortic valve on chronic warfarin anticoagulation who presented to [facility] ED on 09/02/2019 with rectal bleed. Patient had EGD and colonoscopy on 08/30/2019 that revealed some polyps, moderate diverticulosis, internal hemorrhoid and gastritis. Polyps were resected. He was resumed on warfarin with enoxaparin bridge. Noted clotted dark and red blood in toilet on 9/24. Bleeding returned 9/25 PM and worse 9/26 AM, which prompted him to come to ED. Underwent colonoscopy today. Per MD notes, no active bleeding. Polypectomy sites clipped. Pharmacy consulted to start IV heparin post-procedure without bolus per MD request. Last dose of Enoxaparin and Warfarin were 09/01/2019. INR 1.5. Hgb 10.2, Pltc WNL.   Goal of Therapy:  Heparin level 0.3-0.7 units/ml Monitor platelets by anticoagulation protocol: Yes   Plan:  Start heparin infusion at 1200 units/hr Heparin level 8 hours after initiation Daily CBC, heparin level Monitor closely for s/sx of bleeding   Lindell Spar, PharmD, BCPS Clinical Pharmacist 09/03/2019,11:17 AM

## 2019-09-03 NOTE — Anesthesia Preprocedure Evaluation (Addendum)
Anesthesia Evaluation  Patient identified by MRN, date of birth, ID band Patient awake    Reviewed: Allergy & Precautions, NPO status , Patient's Chart, lab work & pertinent test results  History of Anesthesia Complications Negative for: history of anesthetic complications  Airway Mallampati: II  TM Distance: >3 FB Neck ROM: Full    Dental   Pulmonary Current Smoker,    Pulmonary exam normal        Cardiovascular hypertension, + CAD, + CABG and + Peripheral Vascular Disease  Normal cardiovascular exam+ Valvular Problems/Murmurs (s/p mechanical AVR) AS      Neuro/Psych TIAnegative psych ROS   GI/Hepatic Neg liver ROS, Lower GIB   Endo/Other  diabetes, Type 2  Renal/GU Renal disease (b/l renal artery stenosis)  negative genitourinary   Musculoskeletal negative musculoskeletal ROS (+)   Abdominal   Peds  Hematology  (+) Blood dyscrasia (ITP), anemia ,   Anesthesia Other Findings Echo 12/2017: EF 60-65%, Normal appearing mechanical AVR with no peri   valvular regurgitation, mean gradient 12  Reproductive/Obstetrics                           Anesthesia Physical Anesthesia Plan  ASA: III and emergent  Anesthesia Plan: MAC   Post-op Pain Management:    Induction: Intravenous  PONV Risk Score and Plan: Propofol infusion, TIVA and Treatment may vary due to age or medical condition  Airway Management Planned: Natural Airway, Nasal Cannula and Simple Face Mask  Additional Equipment: None  Intra-op Plan:   Post-operative Plan:   Informed Consent: I have reviewed the patients History and Physical, chart, labs and discussed the procedure including the risks, benefits and alternatives for the proposed anesthesia with the patient or authorized representative who has indicated his/her understanding and acceptance.       Plan Discussed with:   Anesthesia Plan Comments:         Anesthesia Quick Evaluation

## 2019-09-03 NOTE — Progress Notes (Signed)
PROGRESS NOTE    [name] [name]  LNL:[id number] DOB: November 30, 1946 DOA: 09/02/2019 PCP: Shon Baton, MD    Brief Narrative:  73 y.o. male with history of colon polyps, diverticulosis, aortic stenosis s/p mechanical valve on Coumadin for anticoagulation, CAD/CABG 2014, PAD/stent and endarterectomies 2012, left renal stent 2016, HTN, ITP and NIDDM-2 presenting with the above chief complaints.  Patient had EGD and colonoscopy on 08/30/2019 that revealed some polyps, moderate diverticulosis, internal hemorrhoid and gastritis.  Polyps were resected.  He was resumed on warfarin with Lovenox bridge.  Noted clotted dark and red blood in toilet on Thursday (9/24) about 3 PM.  Bleeding is not of Friday morning but returned Friday night and worse this morning that prompted him to come to ED.  Last dose of Lovenox and Coumadin was yesterday.  He denies NSAID use. Had similar GI bleed in March 2020.   Patient denies recent illness, fever, chills, cough, shortness of breath, chest pain, palpitation, nausea, vomiting, abdominal pain, UTI symptoms or focal neuro symptoms.  He had mild lightheadedness this morning.  Lives with his cat.  Reports smoking cigar when he plays golf.  Drinks about 2 beers a day.  Denies recreational drug use.  Assessment & Plan:   Active Problems:   GI bleed  Rectal bleed:  -Hgb 11.8 at presentation, trending to 9.3 this afternoon -Colonoscopy on 9/23 -polyps, diverticulosis and internal hemorrhoid.  EGD at the same time revealed gastritis. -GI consulted by EDP, pt underwent colonoscopy 9/27 with findings of solitary ulcers in the cecum and proximal colon s/p clipping -Resumed heparin per GI recs and monitor overnight for bleeding -If stable in AM, consideration for resuming lovenox with coumadin  History of AS/MV on coumadin for anticoagulation -Heparin resumed per above -If stable in AM, consider resuming lovenox with coumadin  History of CAD/CABG in 2014: Stable.    -Pt without anginal symptoms. -Pt to continue on home meds  PAD/renal stenosis/stents: -Seems stable at this time -Continue home medications  HTN:  -Continue home metoprolol -Currently holding home amlodipine and HCTZ. BP trends have been suboptimally controlled, however most recent sbp just under 100. Will cont to monitor for now  NIDDM-2 with hyperglycemia:  -Not in DKA. -Hgb a1c of 7.2 -Continued on SSI coverage -Continue home statin  History of ITP/splenectomy: -Repeat CBC in AM   DVT prophylaxis: SCD's Code Status: DNR Family Communication: Pt in room, family not at bedside Disposition Plan: Uncertain at this time  Consultants:   GI  Procedures:   Colonoscopy 9/27  Antimicrobials: Anti-infectives (From admission, onward)   None       Subjective: Without complaints this AM after endoscopy  Objective: Vitals:   09/03/19 1035 09/03/19 1040 09/03/19 1045 09/03/19 1555  BP:  (!) 164/47  (!) 99/37  Pulse: 66 64 64 67  Resp: 15 11 14 16   Temp:    98.1 F (36.7 C)  TempSrc:    Oral  SpO2: 99% 98% 98% 100%  Weight:      Height:        Intake/Output Summary (Last 24 hours) at 09/03/2019 1616 Last data filed at 09/03/2019 1022 Gross per 24 hour  Intake 738 ml  Output 0 ml  Net 738 ml   Filed Weights   09/02/19 2042 09/03/19 0925  Weight: 75.3 kg 75.3 kg    Examination:  General exam: Appears calm and comfortable  Respiratory system: Clear to auscultation. Respiratory effort normal. Cardiovascular system: S1 & S2 heard, RRR Gastrointestinal system:  Abdomen is nondistended, soft and nontender. No organomegaly or masses felt. Normal bowel sounds heard. Central nervous system: Alert and oriented. No focal neurological deficits. Extremities: Symmetric 5 x 5 power. Skin: No rashes, lesions Psychiatry: Judgement and insight appear normal. Mood & affect appropriate.   Data Reviewed: I have personally reviewed following labs and imaging studies   CBC: Recent Labs  Lab 09/02/19 1008 09/02/19 1701 09/03/19 0051 09/03/19 0816 09/03/19 1534  WBC 6.9  --   --  6.3  --   HGB 11.8* 11.4* 10.0* 10.2* 9.3*  HCT 36.8* 35.7* 31.9* 32.4* 30.0*  MCV 91.5  --   --  92.8  --   PLT 169  --   --  180  --    Basic Metabolic Panel: Recent Labs  Lab 09/02/19 1008 09/03/19 0816  NA 138 142  K 4.2 3.9  CL 103 107  CO2 25 26  GLUCOSE 210* 153*  BUN 21 14  CREATININE 0.81 0.82  CALCIUM 8.9 8.6*   GFR: Estimated Creatinine Clearance: 77.6 mL/min (by C-G formula based on SCr of 0.82 mg/dL). Liver Function Tests: Recent Labs  Lab 09/02/19 1008  AST 23  ALT 25  ALKPHOS 50  BILITOT 0.6  PROT 6.4*  ALBUMIN 4.0   No results for input(s): LIPASE, AMYLASE in the last 168 hours. No results for input(s): AMMONIA in the last 168 hours. Coagulation Profile: Recent Labs  Lab 09/02/19 1008 09/03/19 0816  INR 1.6* 1.5*   Cardiac Enzymes: No results for input(s): CKTOTAL, CKMB, CKMBINDEX, TROPONINI in the last 168 hours. BNP (last 3 results) No results for input(s): PROBNP in the last 8760 hours. HbA1C: Recent Labs    09/02/19 1008 09/03/19 0816  HGBA1C 7.2* 7.2*   CBG: Recent Labs  Lab 09/02/19 1640 09/02/19 2121 09/03/19 0815 09/03/19 1240 09/03/19 1556  GLUCAP 171* 144* 139* 127* 256*   Lipid Profile: No results for input(s): CHOL, HDL, LDLCALC, TRIG, CHOLHDL, LDLDIRECT in the last 72 hours. Thyroid Function Tests: No results for input(s): TSH, T4TOTAL, FREET4, T3FREE, THYROIDAB in the last 72 hours. Anemia Panel: No results for input(s): VITAMINB12, FOLATE, FERRITIN, TIBC, IRON, RETICCTPCT in the last 72 hours. Sepsis Labs: No results for input(s): PROCALCITON, LATICACIDVEN in the last 168 hours.  Recent Results (from the past 240 hour(s))  SARS Coronavirus 2 [facility] order, Performed in [facility] hospital lab) Nasopharyngeal Nasopharyngeal Swab     Status: None   Collection Time: 09/02/19 11:54 AM    Specimen: Nasopharyngeal Swab  Result Value Ref Range Status   SARS Coronavirus 2 NEGATIVE NEGATIVE Final    Comment: (NOTE) If result is NEGATIVE SARS-CoV-2 target nucleic acids are NOT DETECTED. The SARS-CoV-2 RNA is generally detectable in upper and lower  respiratory specimens during the acute phase of infection. The lowest  concentration of SARS-CoV-2 viral copies this assay can detect is 250  copies / mL. A negative result does not preclude SARS-CoV-2 infection  and should not be used as the sole basis for treatment or other  patient management decisions.  A negative result may occur with  improper specimen collection / handling, submission of specimen other  than nasopharyngeal swab, presence of viral mutation(s) within the  areas targeted by this assay, and inadequate number of viral copies  (<250 copies / mL). A negative result must be combined with clinical  observations, patient history, and epidemiological information. If result is POSITIVE SARS-CoV-2 target nucleic acids are DETECTED. The SARS-CoV-2 RNA is generally detectable in  upper and lower  respiratory specimens dur ing the acute phase of infection.  Positive  results are indicative of active infection with SARS-CoV-2.  Clinical  correlation with patient history and other diagnostic information is  necessary to determine patient infection status.  Positive results do  not rule out bacterial infection or co-infection with other viruses. If result is PRESUMPTIVE POSTIVE SARS-CoV-2 nucleic acids MAY BE PRESENT.   A presumptive positive result was obtained on the submitted specimen  and confirmed on repeat testing.  While 2019 novel coronavirus  (SARS-CoV-2) nucleic acids may be present in the submitted sample  additional confirmatory testing may be necessary for epidemiological  and / or clinical management purposes  to differentiate between  SARS-CoV-2 and other Sarbecovirus currently known to infect humans.  If  clinically indicated additional testing with an alternate test  methodology [phone]) is advised. The SARS-CoV-2 RNA is generally  detectable in upper and lower respiratory sp ecimens during the acute  phase of infection. The expected result is Negative. Fact Sheet for Patients:  StrictlyIdeas.no Fact Sheet for Healthcare Providers: BankingDealers.co.za This test is not yet approved or cleared by the [place] FDA and has been authorized for detection and/or diagnosis of SARS-CoV-2 by FDA under an Emergency Use Authorization (EUA).  This EUA will remain in effect (meaning this test can be used) for the duration of the COVID-19 declaration under Section 564(b)(1) of the Act, 21 U.S.C. section 360bbb-3(b)(1), unless the authorization is terminated or revoked sooner. Performed at [facility], [place] [address], [place], [place] [address]      Radiology Studies: No results found.  Scheduled Meds: . atorvastatin  80 mg Oral Daily  . insulin aspart  0-5 Units Subcutaneous QHS  . insulin aspart  0-9 Units Subcutaneous TID WC  . metoprolol succinate  50 mg Oral Daily   Continuous Infusions: . heparin 1,200 Units/hr (09/03/19 1210)  . lactated ringers Stopped (09/03/19 1022)     LOS: 0 days   Marylu Lund, MD [facility] Pager On Amion  If 7PM-7AM, please contact night-coverage 09/03/2019, 4:16 PM

## 2019-09-03 NOTE — Op Note (Signed)
[facility] Patient Name: [name] [name] Procedure Date: 09/03/2019 MRN: [id number] Attending MD: Estill Cotta. Danis , MD Date of Birth: [birth year] CSN: [id number] Age: 73 Admit Type: Inpatient Procedure:                Colonoscopy Indications:              Hematochezia, Acute post hemorrhagic anemia                            (post-polypectomy bleed, outpatient colonoscopy                            9/23 with polyps snareed from cecum x 2 (hot and                            cold), transverse (hot), descending (cold, then 2                            clips applied). Patient on Levonx and coumadin                            after procedure for mechanical aortic valve Providers:                Mallie Mussel L. Loletha Carrow, MD, Elmer Ramp. Hinson, RN, [employer], Technician, Marla Roe, CRNA Referring MD:             [facility] Medicines:                Monitored Anesthesia Care Complications:            No immediate complications. Estimated Blood Loss:     Estimated blood loss: none. Procedure:                Pre-Anesthesia Assessment:                           - Prior to the procedure, a History and Physical                            was performed, and patient medications and                            allergies were reviewed. The patient's tolerance of                            previous anesthesia was also reviewed. The risks                            and benefits of the procedure and the sedation                            options and risks were discussed with the patient.  All questions were answered, and informed consent                            was obtained. Prior Anticoagulants: The patient                            last took Coumadin (warfarin) 2 days and Lovenox                            (enoxaparin) 2 days prior to the procedure. ASA                            Grade Assessment: IV - A patient with severe                        systemic disease that is a constant threat to life.                            After reviewing the risks and benefits, the patient                            was deemed in satisfactory condition to undergo the                            procedure.                           After obtaining informed consent, the colonoscope                            was passed under direct vision. Throughout the                            procedure, the patient's blood pressure, pulse, and                            oxygen saturations were monitored continuously. The                            CF-HQ190L ([id number]) Olympus colonoscope was                            introduced through the anus and advanced to the the                            cecum, identified by appendiceal orifice and                            ileocecal valve. The colonoscopy was performed                            without difficulty. The patient tolerated the  procedure well. The quality of the bowel                            preparation was fair. The ileocecal valve,                            appendiceal orifice, and rectum were photographed. Scope In: 9:39:40 AM Scope Out: 10:14:46 AM Scope Withdrawal Time: 0 hours 32 minutes 50 seconds  Total Procedure Duration: 0 hours 35 minutes 6 seconds  Findings:      The perianal and digital rectal examinations were normal.      Many diverticula were found in the entire colon.      A single (solitary) ten mm ulcer was found in the cecum (hot snare       polypectomy site). No bleeding was present. Stigmata of recent bleeding       were present - probable culprit lesion. For hemostasis, three hemostatic       clips were successfully placed (MR conditional).      A single (solitary) ten mm ulcer was found in the cecum. No bleeding was       present. No stigmata of recent bleeding were seen. For hemostasis, four       hemostatic clips were  successfully placed (MR conditional).      A single (solitary) eight mm ulcer was found in the proximal descending       colon. No bleeding was present. One of the two clips was still present.       No stigmata of recent bleeding were seen. For hemostasis, two hemostatic       clips were successfully placed (MR conditional).      Liquid stool was found in the entire colon. Lavage of the area was       performed, resulting in clearance with fair visualization in some areas.       The transverse colon polypectomy site was not seen.      The exam was otherwise without abnormality on direct and retroflexion       views. Impression:               - Preparation of the colon was fair.                           - Diverticulosis in the entire examined colon.                           - A single (solitary) ulcer in the cecum. Clips (MR                            conditional) were placed.                           - A single (solitary) ulcer in the cecum. Clips (MR                            conditional) were placed.                           - A single (solitary) ulcer in the proximal  descending colon. Clips (MR conditional) were                            placed.                           - Stool in the entire examined colon.                           - The examination was otherwise normal on direct                            and retroflexion views.                           - No specimens collected. Moderate Sedation:      Not Applicable - Patient had care per Anesthesia. Recommendation:           - Return patient to hospital ward for ongoing care.                           - Full liquid diet.                           - Begin unfractionated heparin today.                           CBC tomorrow AM (sooner if rebleeding occurs)                           GI will see patient tomorrow - If no rebleeding by                            tomorrow AM, patient will likely be  discharged home                            on lovenox and coumadin. Procedure Code(s):        --- Professional ---                           [id number], Colonoscopy, flexible; with control of                            bleeding, any method Diagnosis Code(s):        --- Professional ---                           K63.3, Ulcer of intestine                           K92.1, Melena (includes Hematochezia)                           D62, Acute posthemorrhagic anemia                           K57.30, Diverticulosis of large intestine  without                            perforation or abscess without bleeding CPT copyright 2019 American Medical Association. All rights reserved. The codes documented in this report are preliminary and upon coder review may  be revised to meet current compliance requirements. Persia Lintner L. Loletha Carrow, MD 09/03/2019 10:32:49 AM This report has been signed electronically. Number of Addenda: 0

## 2019-09-03 NOTE — Anesthesia Postprocedure Evaluation (Signed)
Anesthesia Post Note  Patient: [name] [name]  Procedure(s) Performed: COLONOSCOPY WITH PROPOFOL (N/A ) HEMOSTASIS CLIP PLACEMENT     Patient location during evaluation: Endoscopy Anesthesia Type: MAC Level of consciousness: awake and alert Pain management: pain level controlled Vital Signs Assessment: post-procedure vital signs reviewed and stable Respiratory status: spontaneous breathing, nonlabored ventilation and respiratory function stable Cardiovascular status: blood pressure returned to baseline and stable Postop Assessment: no apparent nausea or vomiting Anesthetic complications: no    Last Vitals:  Vitals:   09/03/19 1040 09/03/19 1045  BP: (!) 164/47   Pulse: 64 64  Resp: 11 14  Temp:    SpO2: 98% 98%    Last Pain:  Vitals:   09/03/19 1040  TempSrc:   PainSc: 0-No pain                 Lidia Collum

## 2019-09-03 NOTE — Progress Notes (Signed)
ANTICOAGULATION CONSULT NOTE Pharmacy Consult for IV heparin Indication: mechanical valve  No Known Allergies  Patient Measurements: Height: 5\' 8"  (172.7 cm) Weight: 166 lb 0.1 oz (75.3 kg) IBW/kg (Calculated) : 68.4 Heparin Dosing Weight: actual body weight   Vital Signs: Temp: 98.8 F (37.1 C) (09/27 1948) Temp Source: Oral (09/27 1948) BP: 125/37 (09/27 1948) Pulse Rate: 66 (09/27 1948)  Labs: Recent Labs    09/02/19 1008  09/03/19 0051 09/03/19 0816 09/03/19 1534 09/03/19 2044  HGB 11.8*   < > 10.0* 10.2* 9.3*  --   HCT 36.8*   < > 31.9* 32.4* 30.0*  --   PLT 169  --   --  180  --   --   APTT  --   --   --  32  --   --   LABPROT 18.7*  --   --  18.1*  --   --   INR 1.6*  --   --  1.5*  --   --   HEPARINUNFRC  --   --   --   --   --  0.42  CREATININE 0.81  --   --  0.82  --   --    < > = values in this interval not displayed.    Estimated Creatinine Clearance: 77.6 mL/min (by C-G formula based on SCr of 0.82 mg/dL).   Assessment: 79 y/oM with PMH of stroke, [employer] mechanical aortic valve on chronic warfarin anticoagulation who presented to [facility] ED on 09/02/2019 with rectal bleed. Patient had EGD and colonoscopy on 08/30/2019 that revealed some polyps, moderate diverticulosis, internal hemorrhoid and gastritis. Polyps were resected. He was resumed on warfarin with enoxaparin bridge. Noted clotted dark and red blood in toilet on 9/24. Bleeding returned 9/25 PM and worse 9/26 AM, which prompted him to come to ED. Underwent colonoscopy today. Per MD notes, no active bleeding. Polypectomy sites clipped. Pharmacy consulted to start IV heparin post-procedure without bolus per MD request. Last dose of Enoxaparin and Warfarin were 09/01/2019. INR 1.5. Hgb 10.2, Pltc WNL.   09/03/2019 First heparin level is therapeutic at 0.42 on heparin at 1200 units/hr.  No bleeding reported.   Goal of Therapy:  Heparin level 0.3-0.7 units/ml Monitor platelets by anticoagulation protocol:  Yes   Plan:  continue heparin infusion at 1200 units/hr Confirmatory heparin level at 05 am with daily labs Daily CBC, heparin level Monitor closely for s/sx of bleeding  Eudelia Bunch, Pharm.D [phone] 09/03/2019 9:34 PM

## 2019-09-04 ENCOUNTER — Encounter (HOSPITAL_COMMUNITY): Payer: Self-pay | Admitting: Gastroenterology

## 2019-09-04 DIAGNOSIS — K921 Melena: Secondary | ICD-10-CM

## 2019-09-04 DIAGNOSIS — Z9889 Other specified postprocedural states: Secondary | ICD-10-CM

## 2019-09-04 DIAGNOSIS — Z86010 Personal history of colonic polyps: Secondary | ICD-10-CM

## 2019-09-04 LAB — CBC
HCT: 30.6 % — ABNORMAL LOW (ref 39.0–52.0)
Hemoglobin: 9.8 g/dL — ABNORMAL LOW (ref 13.0–17.0)
MCH: 29.9 pg (ref 26.0–34.0)
MCHC: 32 g/dL (ref 30.0–36.0)
MCV: 93.3 fL (ref 80.0–100.0)
Platelets: 189 10*3/uL (ref 150–400)
RBC: 3.28 MIL/uL — ABNORMAL LOW (ref 4.22–5.81)
RDW: 19.7 % — ABNORMAL HIGH (ref 11.5–15.5)
WBC: 8.7 10*3/uL (ref 4.0–10.5)
nRBC: 0 % (ref 0.0–0.2)

## 2019-09-04 LAB — GLUCOSE, CAPILLARY
Glucose-Capillary: 138 mg/dL — ABNORMAL HIGH (ref 70–99)
Glucose-Capillary: 220 mg/dL — ABNORMAL HIGH (ref 70–99)

## 2019-09-04 LAB — HEPARIN LEVEL (UNFRACTIONATED): Heparin Unfractionated: 0.54 IU/mL (ref 0.30–0.70)

## 2019-09-04 MED ORDER — AMLODIPINE BESYLATE 10 MG PO TABS
10.0000 mg | ORAL_TABLET | Freq: Every day | ORAL | Status: DC
  Administered 2019-09-04: 10 mg via ORAL
  Filled 2019-09-04: qty 1

## 2019-09-04 MED ORDER — TRANDOLAPRIL 4 MG PO TABS
4.0000 mg | ORAL_TABLET | Freq: Every day | ORAL | Status: DC
  Administered 2019-09-04: 4 mg via ORAL
  Filled 2019-09-04: qty 1

## 2019-09-04 NOTE — Progress Notes (Signed)
Pt discharged home today per Dr. Wyline Copas. Pt's IV site D/C'd and WDL. Pt's VSS. Pt provided with home medication list and discharge instructions. Verbalized understanding. Pt left floor via WC in stable condition accompanied by NT.

## 2019-09-04 NOTE — Progress Notes (Signed)
Patient ID: [name] [name], male   DOB: 11/21/1946, 73 y.o.   MRN: [id number]    Progress Note   Subjective   Day # 2 CC: GI bleed- post polypectomy Colon  9/27 - 2 ulcers at polypectomy sites - one in cecum, one in descending colon - both clipped- no blood in colon Lovenox started yesterday  HGB 9.3 this am  Patient says he feels fine and would like to go home.  He did have a small bowel movement last evening with no blood. He was on Lovenox prior to colonoscopy and has Lovenox at home, to continue bridge.   Objective   Vital signs in last 24 hours: Temp:  [98.1 F (36.7 C)-99.5 F (37.5 C)] 99.5 F (37.5 C) (09/28 0645) Pulse Rate:  [61-72] 72 (09/28 0645) Resp:  [11-18] 18 (09/28 0645) BP: (99-174)/(34-52) 174/38 (09/28 0645) SpO2:  [97 %-100 %] 97 % (09/28 0645) Last BM Date: 09/03/19 General:    Older white male in NAD, pleasant Heart:  Regular rate and rhythm; valve click Lungs: Respirations even and unlabored, lungs CTA bilaterally Abdomen:  Soft, nontender and nondistended. Normal bowel sounds. Extremities:  Without edema. Neurologic:  Alert and oriented,  grossly normal neurologically. Psych:  Cooperative. Normal mood and affect.  Intake/Output from previous day: 09/27 0701 - 09/28 0700 In: 1124.6 [P.O.:220; I.V.:904.6] Out: 2 [Urine:2] Intake/Output this shift: Total I/O In: 240 [P.O.:240] Out: -   Lab Results: Recent Labs    09/02/19 1008  09/03/19 0816 09/03/19 1534 09/04/19 0436  WBC 6.9  --  6.3  --  8.7  HGB 11.8*   < > 10.2* 9.3* 9.8*  HCT 36.8*   < > 32.4* 30.0* 30.6*  PLT 169  --  180  --  189   < > = values in this interval not displayed.   BMET Recent Labs    09/02/19 1008 09/03/19 0816  NA 138 142  K 4.2 3.9  CL 103 107  CO2 25 26  GLUCOSE 210* 153*  BUN 21 14  CREATININE 0.81 0.82  CALCIUM 8.9 8.6*   LFT Recent Labs    09/02/19 1008  PROT 6.4*  ALBUMIN 4.0  AST 23  ALT 25  ALKPHOS 50  BILITOT 0.6   PT/INR  Recent Labs    09/02/19 1008 09/03/19 0816  LABPROT 18.7* 18.1*  INR 1.6* 1.5*    Studies/Results: No results found.     Assessment / Plan:    #19 73 year old white male admitted with acute lower GI bleed secondary to post polypectomy bleed.  This is in the setting of anticoagulation  Patient has been stable since repeat colonoscopy yesterday with no evidence of bleeding. He is status post endo-clipping of 2 post polypectomy ulcers yesterday 09/03/2019  #2 anemia secondary to acute blood loss-no transfusion requirement #3 status post mechanical aortic valve-on chronic Coumadin #4 diverticulosis #5 peripheral arterial disease #6 adult onset diabetes mellitus #7 history of ITP status post splenectomy  Plan; advance to soft diet Resume Coumadin today Patient has been stable and would like to be discharged today which I think is reasonable.  He has Lovenox at home to continue bridged with resumption of Coumadin, and already has appointment with Coumadin clinic on Wednesday. Would like him to have CBC done at the time of visit with Coumadin clinic. Okay to discharge home today from GI standpoint, patient aware to return to hospital for any evidence of recurrent bleeding.  Active Problems:   GI bleed  LOS: 1 day   Rad Gramling PA-C 09/04/2019, 9:52 AM

## 2019-09-04 NOTE — Plan of Care (Signed)
  Problem: Education: Goal: Knowledge of General Education information will improve Description: Including pain rating scale, medication(s)/side effects and non-pharmacologic comfort measures Outcome: Completed/Met   Problem: Clinical Measurements: Goal: Ability to maintain clinical measurements within normal limits will improve Outcome: Progressing Goal: Will remain free from infection Outcome: Completed/Met Goal: Diagnostic test results will improve Outcome: Completed/Met Goal: Respiratory complications will improve Outcome: Completed/Met Goal: Cardiovascular complication will be avoided Outcome: Completed/Met   Problem: Activity: Goal: Risk for activity intolerance will decrease Outcome: Completed/Met   Problem: Nutrition: Goal: Adequate nutrition will be maintained Outcome: Progressing   Problem: Coping: Goal: Level of anxiety will decrease Outcome: Completed/Met   Problem: Elimination: Goal: Will not experience complications related to bowel motility Outcome: Progressing Goal: Will not experience complications related to urinary retention Outcome: Completed/Met   Problem: Pain Managment: Goal: General experience of comfort will improve Outcome: Completed/Met   Problem: Safety: Goal: Ability to remain free from injury will improve Outcome: Completed/Met   Problem: Skin Integrity: Goal: Risk for impaired skin integrity will decrease Outcome: Completed/Met   Problem: Education: Goal: Ability to identify signs and symptoms of gastrointestinal bleeding will improve Outcome: Completed/Met   Problem: Bowel/Gastric: Goal: Will show no signs and symptoms of gastrointestinal bleeding Outcome: Completed/Met   Problem: Fluid Volume: Goal: Will show no signs and symptoms of excessive bleeding Outcome: Completed/Met   Problem: Clinical Measurements: Goal: Complications related to the disease process, condition or treatment will be avoided or minimized Outcome:  Completed/Met

## 2019-09-04 NOTE — Progress Notes (Signed)
ANTICOAGULATION CONSULT NOTE Pharmacy Consult for IV heparin Indication: mechanical AVR, h/o stroke  No Known Allergies  Patient Measurements: Height: 5\' 8"  (172.7 cm) Weight: 166 lb 0.1 oz (75.3 kg) IBW/kg (Calculated) : 68.4 Heparin Dosing Weight: actual body weight   Vital Signs: Temp: 98.8 F (37.1 C) (09/27 1948) Temp Source: Oral (09/27 1948) BP: 125/37 (09/27 1948) Pulse Rate: 66 (09/27 1948)  Labs: Recent Labs    09/02/19 1008  09/03/19 0816 09/03/19 1534 09/03/19 2044 09/04/19 0436  HGB 11.8*   < > 10.2* 9.3*  --  9.8*  HCT 36.8*   < > 32.4* 30.0*  --  30.6*  PLT 169  --  180  --   --  189  APTT  --   --  32  --   --   --   LABPROT 18.7*  --  18.1*  --   --   --   INR 1.6*  --  1.5*  --   --   --   HEPARINUNFRC  --   --   --   --  0.42 0.54  CREATININE 0.81  --  0.82  --   --   --    < > = values in this interval not displayed.    Estimated Creatinine Clearance: 77.6 mL/min (by C-G formula based on SCr of 0.82 mg/dL).   Assessment: 39 y/oM with PMH of stroke, St. Jude mechanical aortic valve on chronic warfarin anticoagulation who presented to [facility] ED on 09/02/2019 with rectal bleed. Patient had EGD and colonoscopy on 08/30/2019 that revealed some polyps, moderate diverticulosis, internal hemorrhoid and gastritis. Polyps were resected. He was resumed on warfarin with enoxaparin bridge. Noted clotted dark and red blood in toilet on 9/24. Bleeding returned 9/25 PM and worse 9/26 AM, which prompted him to come to ED. Underwent colonoscopy today. Per MD notes, no active bleeding. Polypectomy sites clipped. Pharmacy consulted to start IV heparin post-procedure without bolus per MD request. Last dose of Enoxaparin and Warfarin were 09/01/2019. INR 1.5. Hgb 10.2, Pltc WNL.   09/04/2019  Heparin level remains therapeutic today (0.54) on heparin 1200 units/hr.  RN reports no bleeding noted. Hgb stable Pltc WNL   Goal of Therapy:  Heparin level 0.3-0.7 units/ml Monitor  platelets by anticoagulation protocol: Yes   Plan:  Continue heparin infusion at 1200 units/hr Daily CBC, heparin level Monitor closely for s/sx of bleeding Await word on timing of resuming warfarin with enoxaparin bridge  Clayburn Pert, PharmD, BCPS [phone] 09/04/2019  6:27 AM

## 2019-09-04 NOTE — Progress Notes (Signed)
ANTICOAGULATION CONSULT NOTE Pharmacy Consult for IV heparin>>Lovenox Indication: mechanical AVR, h/o stroke  No Known Allergies  Patient Measurements: Height: 5\' 8"  (172.7 cm) Weight: 166 lb 0.1 oz (75.3 kg) IBW/kg (Calculated) : 68.4 Heparin Dosing Weight: actual body weight   Vital Signs: Temp: 99.5 F (37.5 C) (09/28 0645) Temp Source: Oral (09/28 0645) BP: 174/38 (09/28 0645) Pulse Rate: 72 (09/28 0645)  Labs: Recent Labs    09/02/19 1008  09/03/19 0816 09/03/19 1534 09/03/19 2044 09/04/19 0436  HGB 11.8*   < > 10.2* 9.3*  --  9.8*  HCT 36.8*   < > 32.4* 30.0*  --  30.6*  PLT 169  --  180  --   --  189  APTT  --   --  32  --   --   --   LABPROT 18.7*  --  18.1*  --   --   --   INR 1.6*  --  1.5*  --   --   --   HEPARINUNFRC  --   --   --   --  0.42 0.54  CREATININE 0.81  --  0.82  --   --   --    < > = values in this interval not displayed.    Estimated Creatinine Clearance: 77.6 mL/min (by C-G formula based on SCr of 0.82 mg/dL).   Assessment: 4 y/oM with PMH of stroke, St. Jude mechanical aortic valve on chronic warfarin anticoagulation who presented to [facility] ED on 09/02/2019 with rectal bleed. Patient had EGD and colonoscopy on 08/30/2019 that revealed some polyps, moderate diverticulosis, internal hemorrhoid and gastritis. Polyps were resected. He was resumed on warfarin with enoxaparin bridge. Noted clotted dark and red blood in toilet on 9/24. Bleeding returned 9/25 PM and worse 9/26 AM, which prompted him to come to ED. Underwent colonoscopy today. Per MD notes, no active bleeding. Polypectomy sites clipped. Pharmacy consulted to start IV heparin post-procedure without bolus per MD request. Last dose of Enoxaparin and Warfarin were 09/01/2019. INR 1.5. Hgb 10.2, Pltc WNL.  Pharmacy consulted to change IV Heparin to Lovenox --> Warfarin for discharge.  PTA Warfarin dose: 5mg  daily except 10mg  on Tues, Thurs, Sun PTA Lovenox: 80mg  sq BID  Goal of Therapy:  INR  2.5-3.5 Monitor platelets by anticoagulation protocol: Yes   Plan:  -D/C Heparin at discharge -Resume previous Lovenox 80mg  SQ BID until INR therapeutic & Coumadin as outlined by Coumadin Clinic -Monitor closely for s/sx of bleeding -F/U with Coumadin Clinic on Wed 9/30 for INR check -Discussed plan with patient & Dr Wyline Copas- patient will start Lovenox this afternoon once at home and then continue on 7a/7p schedule  Netta Cedars, PharmD, BCPS 09/04/2019  12:46 PM

## 2019-09-04 NOTE — Plan of Care (Signed)
  Problem: Education: Goal: Knowledge of General Education information will improve Description: Including pain rating scale, medication(s)/side effects and non-pharmacologic comfort measures Outcome: Completed/Met   Problem: Health Behavior/Discharge Planning: Goal: Ability to manage health-related needs will improve Outcome: Completed/Met   Problem: Clinical Measurements: Goal: Ability to maintain clinical measurements within normal limits will improve 09/04/2019 1340 by Annie Sable, RN Outcome: Completed/Met 09/04/2019 0810 by Annie Sable, RN Outcome: Progressing Goal: Will remain free from infection Outcome: Completed/Met Goal: Diagnostic test results will improve Outcome: Completed/Met Goal: Respiratory complications will improve Outcome: Completed/Met Goal: Cardiovascular complication will be avoided Outcome: Completed/Met   Problem: Activity: Goal: Risk for activity intolerance will decrease Outcome: Completed/Met   Problem: Nutrition: Goal: Adequate nutrition will be maintained 09/04/2019 1340 by Annie Sable, RN Outcome: Completed/Met 09/04/2019 0810 by Annie Sable, RN Outcome: Progressing   Problem: Coping: Goal: Level of anxiety will decrease Outcome: Completed/Met   Problem: Elimination: Goal: Will not experience complications related to bowel motility 09/04/2019 1340 by Annie Sable, RN Outcome: Completed/Met 09/04/2019 0810 by Annie Sable, RN Outcome: Progressing Goal: Will not experience complications related to urinary retention Outcome: Completed/Met   Problem: Pain Managment: Goal: General experience of comfort will improve Outcome: Completed/Met   Problem: Safety: Goal: Ability to remain free from injury will improve Outcome: Completed/Met   Problem: Skin Integrity: Goal: Risk for impaired skin integrity will decrease Outcome: Completed/Met   Problem: Education: Goal: Ability to identify signs and symptoms of  gastrointestinal bleeding will improve Outcome: Completed/Met   Problem: Bowel/Gastric: Goal: Will show no signs and symptoms of gastrointestinal bleeding Outcome: Completed/Met   Problem: Fluid Volume: Goal: Will show no signs and symptoms of excessive bleeding Outcome: Completed/Met   Problem: Clinical Measurements: Goal: Complications related to the disease process, condition or treatment will be avoided or minimized Outcome: Completed/Met

## 2019-09-04 NOTE — Discharge Summary (Signed)
Physician Discharge Summary  [name] [name] YHC:[id number] DOB: Oct 28, 1946 DOA: 09/02/2019  PCP: Shon Baton, MD  Admit date: 09/02/2019 Discharge date: 09/04/2019  Admitted From: Home Disposition:  Home  Recommendations for Outpatient Follow-up:  1. Follow up with PCP in 1-2 weeks 2. Follow up with coumadin clinic as scheduled  Discharge Condition:Stable CODE STATUS:DNR Diet recommendation: Heart healthy   Brief/Interim Summary: 72 y.o.malewith history ofcolon polyps, diverticulosis, aortic stenosiss/pmechanical valve on Coumadin for anticoagulation, CAD/CABG 2014, PAD/stent and endarterectomies 2012, left renal stent 2016, HTN, ITP and NIDDM-2 presenting with the above chief complaints.  Patient had EGD and colonoscopy on 08/30/2019 that revealed some polyps, moderate diverticulosis, internal hemorrhoid and gastritis.Polyps were resected. He was resumed on warfarin with Lovenox bridge. Noted clotted dark and red blood in toilet on Thursday (9/24) about 3 PM.Bleeding is not of Friday morning but returned Friday night and worse thismorning that prompted him to come to ED. Last dose of Lovenox and Coumadin was yesterday. He denies NSAID use. Had similar GI bleed in March 2020.  Patient denies recent illness, fever, chills, cough, shortness of breath, chest pain, palpitation, nausea, vomiting, abdominal pain, UTI symptoms or focal neuro symptoms. He had mild lightheadedness this morning.  Lives with his cat. Reports smoking cigar when he plays golf. Drinks about 2 beers a day.Denies recreational drug use.  Discharge Diagnoses:  Active Problems:   GI bleed  Active Problems:   GI bleed  Rectal bleed: -Hgb 11.8 at presentation -Colonoscopy on 9/23-polyps, diverticulosis and internal hemorrhoid.EGD at the same time revealed gastritis. -GI consulted by EDP, pt underwent colonoscopy 9/27 with findings of solitary ulcers in the cecum and proximal colon s/p  clipping -Resumed heparin per GI recs and monitor overnight for bleeding -Hgb has remained stable -Discussed case with GI. OK for d/c today with close outpt follow up  History ofAS/MV on coumadin for anticoagulation -Heparin resumed per above -Plan to transition to lovenox/coumadin bridge for d/c home  History of CAD/CABG in 2014: Stable.  -Pt without anginal symptoms. -Pt to continue on home meds  PAD/renalstenosis/stents: -Seems stable at this time -Continue home medications  HTN:  -Continue home metoprolol while in hospital -Resume home meds on d/c  NIDDM-2with hyperglycemia:  -Not in DKA. -Hgb a1c of 7.2 -Continued on SSI coverage -Continue home statin  History of ITP/splenectomy: -Repeat CBC within one week  Discharge Instructions   Allergies as of 09/04/2019   No Known Allergies     Medication List    STOP taking these medications   pantoprazole 40 MG tablet Commonly known as: PROTONIX     TAKE these medications   amLODipine 10 MG tablet Commonly known as: NORVASC Take 10 mg by mouth daily.   atorvastatin 80 MG tablet Commonly known as: LIPITOR Take 80 mg by mouth daily.   canagliflozin 100 MG Tabs tablet Commonly known as: INVOKANA Take 100 mg by mouth daily.   enoxaparin 80 MG/0.8ML injection Commonly known as: LOVENOX Inject 0.8 mLs (80 mg total) into the skin every 12 (twelve) hours.   glyBURIDE 2.5 MG tablet Commonly known as: DIABETA Take 5 mg by mouth 2 (two) times daily with a meal.   hydrochlorothiazide 12.5 MG capsule Commonly known as: MICROZIDE TAKE 1 CAPSULE BY MOUTH EVERY DAY What changed: how much to take   metoprolol succinate 50 MG 24 hr tablet Commonly known as: TOPROL-XL TAKE 1 TABLET EVERY DAY WITH OR IMMEDIATELY FOLLOWING A MEAL What changed: See the new instructions.   Repatha SureClick  140 MG/ML Soaj Generic drug: Evolocumab Inject 140 mg into the skin every 14 (fourteen) days.   trandolapril 4 MG  tablet Commonly known as: MAVIK TAKE 1 TABLET BY MOUTH EVERY DAY   warfarin 10 MG tablet Commonly known as: COUMADIN Take as directed. If you are unsure how to take this medication, talk to your nurse or doctor. Original instructions: TAKE AS DIRECTED BY COUMADIN CLINIC What changed: See the new instructions.      Follow-up Information    Shon Baton, MD. Schedule an appointment as soon as possible for a visit in 1 week(s).   Specialty: Internal Medicine Contact information: [place] [address] [phone]        Josue Hector, MD .   Specialty: Cardiology Contact information: [phone] [address] [address] [phone]          No Known Allergies  Consultations:  GI  Procedures/Studies:  No results found.  Subjective: Eager to go home  Discharge Exam: Vitals:   09/03/19 1948 09/04/19 0645  BP: (!) 125/37 (!) 174/38  Pulse: 66 72  Resp: 18 18  Temp: 98.8 F (37.1 C) 99.5 F (37.5 C)  SpO2: 97% 97%   Vitals:   09/03/19 1045 09/03/19 1555 09/03/19 1948 09/04/19 0645  BP:  (!) 99/37 (!) 125/37 (!) 174/38  Pulse: 64 67 66 72  Resp: 14 16 18 18   Temp:  98.1 F (36.7 C) 98.8 F (37.1 C) 99.5 F (37.5 C)  TempSrc:  Oral Oral Oral  SpO2: 98% 100% 97% 97%  Weight:      Height:        General: Pt is alert, awake, not in acute distress Cardiovascular: RRR, S1/S2 +, no rubs, no gallops Respiratory: CTA bilaterally, no wheezing, no rhonchi Abdominal: Soft, NT, ND, bowel sounds + Extremities: no edema, no cyanosis   The results of significant diagnostics from this hospitalization (including imaging, microbiology, ancillary and laboratory) are listed below for reference.     Microbiology: Recent Results (from the past 240 hour(s))  SARS Coronavirus 2 [facility] order, Performed in [facility] hospital lab) Nasopharyngeal Nasopharyngeal Swab     Status: None   Collection Time: 09/02/19 11:54 AM    Specimen: Nasopharyngeal Swab  Result Value Ref Range Status   SARS Coronavirus 2 NEGATIVE NEGATIVE Final    Comment: (NOTE) If result is NEGATIVE SARS-CoV-2 target nucleic acids are NOT DETECTED. The SARS-CoV-2 RNA is generally detectable in upper and lower  respiratory specimens during the acute phase of infection. The lowest  concentration of SARS-CoV-2 viral copies this assay can detect is 250  copies / mL. A negative result does not preclude SARS-CoV-2 infection  and should not be used as the sole basis for treatment or other  patient management decisions.  A negative result may occur with  improper specimen collection / handling, submission of specimen other  than nasopharyngeal swab, presence of viral mutation(s) within the  areas targeted by this assay, and inadequate number of viral copies  (<250 copies / mL). A negative result must be combined with clinical  observations, patient history, and epidemiological information. If result is POSITIVE SARS-CoV-2 target nucleic acids are DETECTED. The SARS-CoV-2 RNA is generally detectable in upper and lower  respiratory specimens dur ing the acute phase of infection.  Positive  results are indicative of active infection with SARS-CoV-2.  Clinical  correlation with patient history and other diagnostic information is  necessary to determine patient infection  status.  Positive results do  not rule out bacterial infection or co-infection with other viruses. If result is PRESUMPTIVE POSTIVE SARS-CoV-2 nucleic acids MAY BE PRESENT.   A presumptive positive result was obtained on the submitted specimen  and confirmed on repeat testing.  While 2019 novel coronavirus  (SARS-CoV-2) nucleic acids may be present in the submitted sample  additional confirmatory testing may be necessary for epidemiological  and / or clinical management purposes  to differentiate between  SARS-CoV-2 and other Sarbecovirus currently known to infect humans.  If  clinically indicated additional testing with an alternate test  methodology [phone]) is advised. The SARS-CoV-2 RNA is generally  detectable in upper and lower respiratory sp ecimens during the acute  phase of infection. The expected result is Negative. Fact Sheet for Patients:  StrictlyIdeas.no Fact Sheet for Healthcare Providers: BankingDealers.co.za This test is not yet approved or cleared by the [place] FDA and has been authorized for detection and/or diagnosis of SARS-CoV-2 by FDA under an Emergency Use Authorization (EUA).  This EUA will remain in effect (meaning this test can be used) for the duration of the COVID-19 declaration under Section 564(b)(1) of the Act, 21 U.S.C. section 360bbb-3(b)(1), unless the authorization is terminated or revoked sooner. Performed at [facility], [place] [address], [place], [place] [address]      Labs: BNP (last 3 results) No results for input(s): BNP in the last 8760 hours. Basic Metabolic Panel: Recent Labs  Lab 09/02/19 1008 09/03/19 0816  NA 138 142  K 4.2 3.9  CL 103 107  CO2 25 26  GLUCOSE 210* 153*  BUN 21 14  CREATININE 0.81 0.82  CALCIUM 8.9 8.6*   Liver Function Tests: Recent Labs  Lab 09/02/19 1008  AST 23  ALT 25  ALKPHOS 50  BILITOT 0.6  PROT 6.4*  ALBUMIN 4.0   No results for input(s): LIPASE, AMYLASE in the last 168 hours. No results for input(s): AMMONIA in the last 168 hours. CBC: Recent Labs  Lab 09/02/19 1008 09/02/19 1701 09/03/19 0051 09/03/19 0816 09/03/19 1534 09/04/19 0436  WBC 6.9  --   --  6.3  --  8.7  HGB 11.8* 11.4* 10.0* 10.2* 9.3* 9.8*  HCT 36.8* 35.7* 31.9* 32.4* 30.0* 30.6*  MCV 91.5  --   --  92.8  --  93.3  PLT 169  --   --  180  --  189   Cardiac Enzymes: No results for input(s): CKTOTAL, CKMB, CKMBINDEX, TROPONINI in the last 168 hours. BNP: Invalid input(s): POCBNP CBG: Recent Labs  Lab  09/03/19 1240 09/03/19 1556 09/03/19 2130 09/04/19 0749 09/04/19 1202  GLUCAP 127* 256* 114* 138* 220*   D-Dimer No results for input(s): DDIMER in the last 72 hours. Hgb A1c Recent Labs    09/02/19 1008 09/03/19 0816  HGBA1C 7.2* 7.2*   Lipid Profile No results for input(s): CHOL, HDL, LDLCALC, TRIG, CHOLHDL, LDLDIRECT in the last 72 hours. Thyroid function studies No results for input(s): TSH, T4TOTAL, T3FREE, THYROIDAB in the last 72 hours.  Invalid input(s): FREET3 Anemia work up No results for input(s): VITAMINB12, FOLATE, FERRITIN, TIBC, IRON, RETICCTPCT in the last 72 hours. Urinalysis    Component Value Date/Time   COLORURINE STRAW (A) 12/20/2017 1924   APPEARANCEUR CLEAR 12/20/2017 1924   LABSPEC 1.020 12/20/2017 1924   PHURINE 7.0 12/20/2017 1924   GLUCOSEU >=500 (A) 12/20/2017 [place] NEGATIVE 12/20/2017 [place] NEGATIVE 12/20/2017 1924   KETONESUR  5 (A) 12/20/2017 1924   PROTEINUR NEGATIVE 12/20/2017 1924   UROBILINOGEN 0.2 01/15/2012 1415   NITRITE NEGATIVE 12/20/2017 1924   LEUKOCYTESUR NEGATIVE 12/20/2017 1924   Sepsis Labs Invalid input(s): PROCALCITONIN,  WBC,  LACTICIDVEN Microbiology Recent Results (from the past 240 hour(s))  SARS Coronavirus 2 [facility] order, Performed in [facility] hospital lab) Nasopharyngeal Nasopharyngeal Swab     Status: None   Collection Time: 09/02/19 11:54 AM   Specimen: Nasopharyngeal Swab  Result Value Ref Range Status   SARS Coronavirus 2 NEGATIVE NEGATIVE Final    Comment: (NOTE) If result is NEGATIVE SARS-CoV-2 target nucleic acids are NOT DETECTED. The SARS-CoV-2 RNA is generally detectable in upper and lower  respiratory specimens during the acute phase of infection. The lowest  concentration of SARS-CoV-2 viral copies this assay can detect is 250  copies / mL. A negative result does not preclude SARS-CoV-2 infection  and should not be used as the sole basis for treatment or other  patient  management decisions.  A negative result may occur with  improper specimen collection / handling, submission of specimen other  than nasopharyngeal swab, presence of viral mutation(s) within the  areas targeted by this assay, and inadequate number of viral copies  (<250 copies / mL). A negative result must be combined with clinical  observations, patient history, and epidemiological information. If result is POSITIVE SARS-CoV-2 target nucleic acids are DETECTED. The SARS-CoV-2 RNA is generally detectable in upper and lower  respiratory specimens dur ing the acute phase of infection.  Positive  results are indicative of active infection with SARS-CoV-2.  Clinical  correlation with patient history and other diagnostic information is  necessary to determine patient infection status.  Positive results do  not rule out bacterial infection or co-infection with other viruses. If result is PRESUMPTIVE POSTIVE SARS-CoV-2 nucleic acids MAY BE PRESENT.   A presumptive positive result was obtained on the submitted specimen  and confirmed on repeat testing.  While 2019 novel coronavirus  (SARS-CoV-2) nucleic acids may be present in the submitted sample  additional confirmatory testing may be necessary for epidemiological  and / or clinical management purposes  to differentiate between  SARS-CoV-2 and other Sarbecovirus currently known to infect humans.  If clinically indicated additional testing with an alternate test  methodology [phone]) is advised. The SARS-CoV-2 RNA is generally  detectable in upper and lower respiratory sp ecimens during the acute  phase of infection. The expected result is Negative. Fact Sheet for Patients:  StrictlyIdeas.no Fact Sheet for Healthcare Providers: BankingDealers.co.za This test is not yet approved or cleared by the [place] FDA and has been authorized for detection and/or diagnosis of SARS-CoV-2 by FDA under  an Emergency Use Authorization (EUA).  This EUA will remain in effect (meaning this test can be used) for the duration of the COVID-19 declaration under Section 564(b)(1) of the Act, 21 U.S.C. section 360bbb-3(b)(1), unless the authorization is terminated or revoked sooner. Performed at [facility], [place] [address], [place], [place] [address]    Time spent: 30 min  SIGNED:   Marylu Lund, MD  [facility] 09/04/2019, 12:32 PM  If 7PM-7AM, please contact night-coverage

## 2019-09-06 ENCOUNTER — Other Ambulatory Visit: Payer: Self-pay

## 2019-09-06 ENCOUNTER — Ambulatory Visit (INDEPENDENT_AMBULATORY_CARE_PROVIDER_SITE_OTHER): Payer: Medicare Other

## 2019-09-06 ENCOUNTER — Encounter: Payer: Self-pay | Admitting: Gastroenterology

## 2019-09-06 ENCOUNTER — Other Ambulatory Visit (INDEPENDENT_AMBULATORY_CARE_PROVIDER_SITE_OTHER): Payer: Medicare Other

## 2019-09-06 DIAGNOSIS — I359 Nonrheumatic aortic valve disorder, unspecified: Secondary | ICD-10-CM | POA: Diagnosis not present

## 2019-09-06 DIAGNOSIS — K254 Chronic or unspecified gastric ulcer with hemorrhage: Secondary | ICD-10-CM

## 2019-09-06 DIAGNOSIS — K3189 Other diseases of stomach and duodenum: Secondary | ICD-10-CM

## 2019-09-06 DIAGNOSIS — G459 Transient cerebral ischemic attack, unspecified: Secondary | ICD-10-CM | POA: Diagnosis not present

## 2019-09-06 DIAGNOSIS — Z5181 Encounter for therapeutic drug level monitoring: Secondary | ICD-10-CM | POA: Diagnosis not present

## 2019-09-06 LAB — CBC WITH DIFFERENTIAL/PLATELET
Basophils Absolute: 0.1 10*3/uL (ref 0.0–0.1)
Basophils Relative: 0.7 % (ref 0.0–3.0)
Eosinophils Absolute: 0.1 10*3/uL (ref 0.0–0.7)
Eosinophils Relative: 1.2 % (ref 0.0–5.0)
HCT: 29.7 % — ABNORMAL LOW (ref 39.0–52.0)
Hemoglobin: 9.8 g/dL — ABNORMAL LOW (ref 13.0–17.0)
Lymphocytes Relative: 23.7 % (ref 12.0–46.0)
Lymphs Abs: 2.2 10*3/uL (ref 0.7–4.0)
MCHC: 33.1 g/dL (ref 30.0–36.0)
MCV: 91.1 fl (ref 78.0–100.0)
Monocytes Absolute: 1.2 10*3/uL — ABNORMAL HIGH (ref 0.1–1.0)
Monocytes Relative: 13.3 % — ABNORMAL HIGH (ref 3.0–12.0)
Neutro Abs: 5.7 10*3/uL (ref 1.4–7.7)
Neutrophils Relative %: 61.1 % (ref 43.0–77.0)
Platelets: 234 10*3/uL (ref 150.0–400.0)
RBC: 3.26 Mil/uL — ABNORMAL LOW (ref 4.22–5.81)
RDW: 18.9 % — ABNORMAL HIGH (ref 11.5–15.5)
WBC: 9.3 10*3/uL (ref 4.0–10.5)

## 2019-09-06 LAB — POCT INR: INR: 1.1 — AB (ref 2.0–3.0)

## 2019-09-06 NOTE — Patient Instructions (Signed)
Description   Take an extra 1/2 tablet today and tomorrow, then resume same dosage 5mg  daily except 10mg  on Sundays, Tuesdays, and Thursdays. Continue on Lovenox 80mg  twice daily. Recheck on Monday.  Call us with any medication changes or concerns Coumadin Clinic [phone], Main # [phone].

## 2019-09-11 ENCOUNTER — Ambulatory Visit (INDEPENDENT_AMBULATORY_CARE_PROVIDER_SITE_OTHER): Payer: Medicare Other | Admitting: *Deleted

## 2019-09-11 ENCOUNTER — Other Ambulatory Visit: Payer: Self-pay

## 2019-09-11 DIAGNOSIS — N182 Chronic kidney disease, stage 2 (mild): Secondary | ICD-10-CM | POA: Diagnosis not present

## 2019-09-11 DIAGNOSIS — Z5181 Encounter for therapeutic drug level monitoring: Secondary | ICD-10-CM

## 2019-09-11 DIAGNOSIS — I359 Nonrheumatic aortic valve disorder, unspecified: Secondary | ICD-10-CM

## 2019-09-11 DIAGNOSIS — K922 Gastrointestinal hemorrhage, unspecified: Secondary | ICD-10-CM | POA: Diagnosis not present

## 2019-09-11 DIAGNOSIS — D649 Anemia, unspecified: Secondary | ICD-10-CM | POA: Diagnosis not present

## 2019-09-11 DIAGNOSIS — E1122 Type 2 diabetes mellitus with diabetic chronic kidney disease: Secondary | ICD-10-CM | POA: Diagnosis not present

## 2019-09-11 DIAGNOSIS — I1310 Hypertensive heart and chronic kidney disease without heart failure, with stage 1 through stage 4 chronic kidney disease, or unspecified chronic kidney disease: Secondary | ICD-10-CM | POA: Diagnosis not present

## 2019-09-11 DIAGNOSIS — Z952 Presence of prosthetic heart valve: Secondary | ICD-10-CM | POA: Diagnosis not present

## 2019-09-11 DIAGNOSIS — Z7901 Long term (current) use of anticoagulants: Secondary | ICD-10-CM | POA: Diagnosis not present

## 2019-09-11 LAB — POCT INR: INR: 2.7 (ref 2.0–3.0)

## 2019-09-11 NOTE — Patient Instructions (Signed)
Description   Stop Lovenox and continue same dosage 5mg  daily except 10mg  on Sundays, Tuesdays, and Thursdays. Recheck INR in 2 weeks. Call us with any medication changes or concerns Coumadin Clinic [phone], Main # [phone].

## 2019-09-15 ENCOUNTER — Telehealth: Payer: Self-pay | Admitting: Gastroenterology

## 2019-09-15 DIAGNOSIS — I1310 Hypertensive heart and chronic kidney disease without heart failure, with stage 1 through stage 4 chronic kidney disease, or unspecified chronic kidney disease: Secondary | ICD-10-CM | POA: Diagnosis not present

## 2019-09-15 NOTE — Telephone Encounter (Signed)
Patient reports that he is having constiption since the colonoscopies and his recent hospitalization.  He is no longer having any rectal bleeding.  He will start Miralax 1-3 times a day then PRN when his constipation is relieved.  He is asked to resume a high fiber diet and call back on Monday if he still has any questions or concerns.

## 2019-09-16 ENCOUNTER — Emergency Department (HOSPITAL_COMMUNITY): Payer: Medicare Other

## 2019-09-16 ENCOUNTER — Other Ambulatory Visit: Payer: Self-pay

## 2019-09-16 ENCOUNTER — Encounter (HOSPITAL_COMMUNITY): Payer: Self-pay | Admitting: Emergency Medicine

## 2019-09-16 ENCOUNTER — Inpatient Hospital Stay (HOSPITAL_COMMUNITY)
Admission: EM | Admit: 2019-09-16 | Discharge: 2019-09-19 | DRG: 286 | Disposition: A | Discharge status: Home / Self Care | Payer: Medicare Other | Attending: Cardiovascular Disease | Admitting: Cardiovascular Disease

## 2019-09-16 DIAGNOSIS — R011 Cardiac murmur, unspecified: Secondary | ICD-10-CM | POA: Diagnosis present

## 2019-09-16 DIAGNOSIS — I110 Hypertensive heart disease with heart failure: Secondary | ICD-10-CM | POA: Diagnosis present

## 2019-09-16 DIAGNOSIS — Z86010 Personal history of colonic polyps: Secondary | ICD-10-CM

## 2019-09-16 DIAGNOSIS — Z7984 Long term (current) use of oral hypoglycemic drugs: Secondary | ICD-10-CM

## 2019-09-16 DIAGNOSIS — R0600 Dyspnea, unspecified: Secondary | ICD-10-CM

## 2019-09-16 DIAGNOSIS — R002 Palpitations: Secondary | ICD-10-CM | POA: Diagnosis not present

## 2019-09-16 DIAGNOSIS — F17210 Nicotine dependence, cigarettes, uncomplicated: Secondary | ICD-10-CM | POA: Diagnosis present

## 2019-09-16 DIAGNOSIS — Z20828 Contact with and (suspected) exposure to other viral communicable diseases: Secondary | ICD-10-CM | POA: Diagnosis not present

## 2019-09-16 DIAGNOSIS — K633 Ulcer of intestine: Secondary | ICD-10-CM | POA: Diagnosis present

## 2019-09-16 DIAGNOSIS — Z9081 Acquired absence of spleen: Secondary | ICD-10-CM

## 2019-09-16 DIAGNOSIS — R079 Chest pain, unspecified: Secondary | ICD-10-CM | POA: Diagnosis not present

## 2019-09-16 DIAGNOSIS — T82856A Stenosis of peripheral vascular stent, initial encounter: Secondary | ICD-10-CM | POA: Diagnosis not present

## 2019-09-16 DIAGNOSIS — I25708 Atherosclerosis of coronary artery bypass graft(s), unspecified, with other forms of angina pectoris: Secondary | ICD-10-CM | POA: Diagnosis not present

## 2019-09-16 DIAGNOSIS — I5031 Acute diastolic (congestive) heart failure: Secondary | ICD-10-CM | POA: Diagnosis not present

## 2019-09-16 DIAGNOSIS — I248 Other forms of acute ischemic heart disease: Secondary | ICD-10-CM

## 2019-09-16 DIAGNOSIS — I249 Acute ischemic heart disease, unspecified: Secondary | ICD-10-CM

## 2019-09-16 DIAGNOSIS — Z8719 Personal history of other diseases of the digestive system: Secondary | ICD-10-CM

## 2019-09-16 DIAGNOSIS — I493 Ventricular premature depolarization: Secondary | ICD-10-CM | POA: Diagnosis present

## 2019-09-16 DIAGNOSIS — I2510 Atherosclerotic heart disease of native coronary artery without angina pectoris: Secondary | ICD-10-CM | POA: Diagnosis present

## 2019-09-16 DIAGNOSIS — D649 Anemia, unspecified: Secondary | ICD-10-CM

## 2019-09-16 DIAGNOSIS — Z952 Presence of prosthetic heart valve: Secondary | ICD-10-CM | POA: Diagnosis not present

## 2019-09-16 DIAGNOSIS — Z961 Presence of intraocular lens: Secondary | ICD-10-CM | POA: Diagnosis present

## 2019-09-16 DIAGNOSIS — Z7901 Long term (current) use of anticoagulants: Secondary | ICD-10-CM

## 2019-09-16 DIAGNOSIS — D693 Immune thrombocytopenic purpura: Secondary | ICD-10-CM | POA: Diagnosis not present

## 2019-09-16 DIAGNOSIS — Z951 Presence of aortocoronary bypass graft: Secondary | ICD-10-CM

## 2019-09-16 DIAGNOSIS — R0602 Shortness of breath: Secondary | ICD-10-CM | POA: Diagnosis not present

## 2019-09-16 DIAGNOSIS — E1151 Type 2 diabetes mellitus with diabetic peripheral angiopathy without gangrene: Secondary | ICD-10-CM | POA: Diagnosis present

## 2019-09-16 DIAGNOSIS — I25110 Atherosclerotic heart disease of native coronary artery with unstable angina pectoris: Principal | ICD-10-CM | POA: Diagnosis present

## 2019-09-16 DIAGNOSIS — I214 Non-ST elevation (NSTEMI) myocardial infarction: Secondary | ICD-10-CM | POA: Insufficient documentation

## 2019-09-16 DIAGNOSIS — I739 Peripheral vascular disease, unspecified: Secondary | ICD-10-CM | POA: Diagnosis not present

## 2019-09-16 DIAGNOSIS — Z9842 Cataract extraction status, left eye: Secondary | ICD-10-CM

## 2019-09-16 DIAGNOSIS — Z9841 Cataract extraction status, right eye: Secondary | ICD-10-CM

## 2019-09-16 DIAGNOSIS — Z8249 Family history of ischemic heart disease and other diseases of the circulatory system: Secondary | ICD-10-CM

## 2019-09-16 DIAGNOSIS — E785 Hyperlipidemia, unspecified: Secondary | ICD-10-CM | POA: Diagnosis present

## 2019-09-16 DIAGNOSIS — I10 Essential (primary) hypertension: Secondary | ICD-10-CM | POA: Diagnosis present

## 2019-09-16 DIAGNOSIS — Z8679 Personal history of other diseases of the circulatory system: Secondary | ICD-10-CM

## 2019-09-16 DIAGNOSIS — R69 Illness, unspecified: Secondary | ICD-10-CM

## 2019-09-16 DIAGNOSIS — Z79899 Other long term (current) drug therapy: Secondary | ICD-10-CM

## 2019-09-16 DIAGNOSIS — I200 Unstable angina: Secondary | ICD-10-CM | POA: Diagnosis present

## 2019-09-16 DIAGNOSIS — R0789 Other chest pain: Secondary | ICD-10-CM | POA: Diagnosis not present

## 2019-09-16 DIAGNOSIS — Y838 Other surgical procedures as the cause of abnormal reaction of the patient, or of later complication, without mention of misadventure at the time of the procedure: Secondary | ICD-10-CM | POA: Diagnosis present

## 2019-09-16 DIAGNOSIS — I083 Combined rheumatic disorders of mitral, aortic and tricuspid valves: Secondary | ICD-10-CM | POA: Diagnosis present

## 2019-09-16 DIAGNOSIS — Z833 Family history of diabetes mellitus: Secondary | ICD-10-CM

## 2019-09-16 DIAGNOSIS — Z8673 Personal history of transient ischemic attack (TIA), and cerebral infarction without residual deficits: Secondary | ICD-10-CM

## 2019-09-16 LAB — BASIC METABOLIC PANEL
Anion gap: 12 (ref 5–15)
BUN: 13 mg/dL (ref 8–23)
CO2: 24 mmol/L (ref 22–32)
Calcium: 9.1 mg/dL (ref 8.9–10.3)
Chloride: 99 mmol/L (ref 98–111)
Creatinine, Ser: 0.93 mg/dL (ref 0.61–1.24)
GFR calc Af Amer: >60 mL/min (ref >60)
GFR calc non Af Amer: >60 mL/min (ref >60)
Glucose, Bld: 214 mg/dL — ABNORMAL HIGH (ref 70–99)
Potassium: 4.4 mmol/L (ref 3.5–5.1)
Sodium: 135 mmol/L (ref 135–145)

## 2019-09-16 LAB — CBC
HCT: 32.7 % — ABNORMAL LOW (ref 39.0–52.0)
Hemoglobin: 10.2 g/dL — ABNORMAL LOW (ref 13.0–17.0)
MCH: 27.9 pg (ref 26.0–34.0)
MCHC: 31.2 g/dL (ref 30.0–36.0)
MCV: 89.3 fL (ref 80.0–100.0)
Platelets: 448 10*3/uL — ABNORMAL HIGH (ref 150–400)
RBC: 3.66 MIL/uL — ABNORMAL LOW (ref 4.22–5.81)
RDW: 19.2 % — ABNORMAL HIGH (ref 11.5–15.5)
WBC: 10.2 10*3/uL (ref 4.0–10.5)
nRBC: 1.9 % — ABNORMAL HIGH (ref 0.0–0.2)

## 2019-09-16 LAB — TROPONIN I (HIGH SENSITIVITY)
Troponin I (High Sensitivity): 242 ng/L (ref <18)
Troponin I (High Sensitivity): 225 ng/L (ref <18)
Troponin I (High Sensitivity): 265 ng/L (ref <18)
Troponin I (High Sensitivity): 232 ng/L (ref <18)

## 2019-09-16 LAB — URINALYSIS, ROUTINE W REFLEX MICROSCOPIC
Bacteria, UA: NONE SEEN
Bilirubin Urine: NEGATIVE
Glucose, UA: >=500 mg/dL — AB
Hgb urine dipstick: NEGATIVE
Ketones, ur: 5 mg/dL — AB
Leukocytes,Ua: NEGATIVE
Nitrite: NEGATIVE
Protein, ur: NEGATIVE mg/dL
Specific Gravity, Urine: 1.027 (ref 1.005–1.030)
pH: 7 (ref 5.0–8.0)

## 2019-09-16 LAB — HEPARIN LEVEL (UNFRACTIONATED): Heparin Unfractionated: <0.1 IU/mL — ABNORMAL LOW (ref 0.30–0.70)

## 2019-09-16 LAB — BRAIN NATRIURETIC PEPTIDE: B Natriuretic Peptide: 676.4 pg/mL — ABNORMAL HIGH (ref 0.0–100.0)

## 2019-09-16 LAB — POC OCCULT BLOOD, ED: Fecal Occult Bld: NEGATIVE

## 2019-09-16 LAB — LACTIC ACID, PLASMA: Lactic Acid, Venous: 1.3 mmol/L (ref 0.5–1.9)

## 2019-09-16 LAB — HEPATIC FUNCTION PANEL
ALT: 29 U/L (ref 0–44)
AST: 31 U/L (ref 15–41)
Albumin: 3.7 g/dL (ref 3.5–5.0)
Alkaline Phosphatase: 61 U/L (ref 38–126)
Bilirubin, Direct: 0.2 mg/dL (ref 0.0–0.2)
Indirect Bilirubin: 0.6 mg/dL (ref 0.3–0.9)
Total Bilirubin: 0.8 mg/dL (ref 0.3–1.2)
Total Protein: 7 g/dL (ref 6.5–8.1)

## 2019-09-16 LAB — SARS CORONAVIRUS 2 (TAT 6-24 HRS): SARS Coronavirus 2: NEGATIVE

## 2019-09-16 LAB — PROTIME-INR
INR: 1.9 — ABNORMAL HIGH (ref 0.8–1.2)
Prothrombin Time: 21.3 seconds — ABNORMAL HIGH (ref 11.4–15.2)

## 2019-09-16 LAB — LIPASE, BLOOD: Lipase: 35 U/L (ref 11–51)

## 2019-09-16 MED ORDER — ASPIRIN 300 MG RE SUPP
300.0000 mg | RECTAL | Status: AC
  Filled 2019-09-16: qty 1

## 2019-09-16 MED ORDER — IOHEXOL 350 MG/ML SOLN
75.0000 mL | Freq: Once | INTRAVENOUS | Status: AC | PRN
  Administered 2019-09-16: 75 mL via INTRAVENOUS

## 2019-09-16 MED ORDER — ASPIRIN 81 MG PO CHEW
324.0000 mg | CHEWABLE_TABLET | ORAL | Status: AC
  Administered 2019-09-16: 324 mg via ORAL
  Filled 2019-09-16: qty 4

## 2019-09-16 MED ORDER — METOPROLOL SUCCINATE ER 50 MG PO TB24
50.0000 mg | ORAL_TABLET | Freq: Every day | ORAL | Status: DC
  Administered 2019-09-17 – 2019-09-19 (×3): 50 mg via ORAL
  Filled 2019-09-16 (×3): qty 1

## 2019-09-16 MED ORDER — TRANDOLAPRIL 1 MG PO TABS
4.0000 mg | ORAL_TABLET | Freq: Every day | ORAL | Status: DC
  Administered 2019-09-17 – 2019-09-19 (×3): 4 mg via ORAL
  Filled 2019-09-16: qty 4
  Filled 2019-09-16: qty 1
  Filled 2019-09-16 (×2): qty 4

## 2019-09-16 MED ORDER — HEPARIN (PORCINE) 25000 UT/250ML-% IV SOLN
1550.0000 [IU]/h | INTRAVENOUS | Status: DC
  Administered 2019-09-16: 12:00:00 1150 [IU]/h via INTRAVENOUS
  Administered 2019-09-17: 1550 [IU]/h via INTRAVENOUS
  Filled 2019-09-16 (×3): qty 250

## 2019-09-16 MED ORDER — FUROSEMIDE 10 MG/ML IJ SOLN
40.0000 mg | Freq: Once | INTRAMUSCULAR | Status: AC
  Administered 2019-09-16: 40 mg via INTRAVENOUS
  Filled 2019-09-16: qty 4

## 2019-09-16 MED ORDER — ONDANSETRON HCL 4 MG/2ML IJ SOLN: 4.0000 mg | Freq: Four times a day (QID) | INTRAMUSCULAR | Status: DC | PRN

## 2019-09-16 MED ORDER — ATORVASTATIN CALCIUM 80 MG PO TABS
80.0000 mg | ORAL_TABLET | Freq: Every day | ORAL | Status: DC
  Administered 2019-09-17 – 2019-09-19 (×3): 80 mg via ORAL
  Filled 2019-09-16 (×3): qty 1

## 2019-09-16 MED ORDER — AMLODIPINE BESYLATE 10 MG PO TABS
10.0000 mg | ORAL_TABLET | Freq: Every day | ORAL | Status: DC
  Administered 2019-09-17 – 2019-09-19 (×3): 10 mg via ORAL
  Filled 2019-09-16 (×3): qty 1

## 2019-09-16 MED ORDER — ACETAMINOPHEN 325 MG PO TABS: 650.0000 mg | ORAL_TABLET | ORAL | Status: DC | PRN

## 2019-09-16 MED ORDER — NITROGLYCERIN 0.4 MG SL SUBL: 0.4000 mg | SUBLINGUAL_TABLET | SUBLINGUAL | Status: DC | PRN

## 2019-09-16 MED ORDER — ASPIRIN EC 81 MG PO TBEC
81.0000 mg | DELAYED_RELEASE_TABLET | Freq: Every day | ORAL | Status: DC
  Administered 2019-09-17 – 2019-09-19 (×2): 81 mg via ORAL
  Filled 2019-09-16 (×3): qty 1

## 2019-09-16 MED ORDER — RISAQUAD PO CAPS
1.0000 | ORAL_CAPSULE | Freq: Every day | ORAL | Status: DC
  Administered 2019-09-17 – 2019-09-19 (×3): 1 via ORAL
  Filled 2019-09-16 (×3): qty 1

## 2019-09-16 MED ORDER — CANAGLIFLOZIN 100 MG PO TABS
100.0000 mg | ORAL_TABLET | Freq: Every day | ORAL | Status: DC
  Administered 2019-09-17 – 2019-09-19 (×3): 100 mg via ORAL
  Filled 2019-09-16 (×3): qty 1

## 2019-09-16 NOTE — ED Notes (Signed)
ED TO INPATIENT HANDOFF REPORT  ED Nurse Name and Phone #: Joellen Jersey [phone]  S Name/Age/Gender [name] [name] 73 y.o. male Room/Bed: 014C/014C  Code Status   Code Status: Prior  Home/SNF/Other Home Patient oriented to: self, place, time and situation Is this baseline? Yes   Triage Complete: Triage complete  Chief Complaint cp  Triage Note Patient arrives c/o central chest tightness/burning with palpitations x 4-5 days. Patient states he was recently seen at [facility] and admitted for low hemoglobin but never required a transfusion. Patient also reports shortness of breath with exertion.    Allergies No Known Allergies  Level of Care/Admitting Diagnosis ED Disposition    ED Disposition Condition Comment   Admit  Hospital Area: [facility] [[id number]]  Level of Care: Telemetry Cardiac [103]  Covid Evaluation: Asymptomatic Screening Protocol (No Symptoms)  Diagnosis: NSTEMI (non-ST elevated myocardial infarction) [facility]) [[id number]]  Admitting Physician: Herminio Commons [[id number]]  Attending Physician: Kate Sable A [[id number]]  PT Class (Do Not Modify): Observation [104]  PT Acc Code (Do Not Modify): Observation [10022]       B Medical/Surgery History Past Medical History:  Diagnosis Date  . Adenomatous colon polyp 09/1997  . Anemia   . Aortic stenosis    s/p st. jude mechanical AVR - Chronic Coumadin  . Blood transfusion    "related to ITP"  . Coronary artery disease    s/p cabg x 3 11/2003: lima-lad, seq vg to rpda and rpl  . Diverticulitis of colon   . Heart murmur   . Hyperlipidemia   . Hypertension   . ITP (idiopathic thrombocytopenic purpura)   . Peripheral arterial disease ([place])    a. history of aortobifemoral bypass grafting by Dr. Sherren Mocha early b. LE angiography 04/22/2015 patent aortobifem graft, DES to R SFA  . Peripheral vascular disease ([place])    s/p Left external Iliac Artery stenting and subsequent left femoral endarterectomy  02/2011- post op course complicated by wound infxn req I&D 03/2011  . Renal artery stenosis, native, bilateral (HCC)    a. bilateral renal artery stenosis by recent duplex ultrasound b. L renal artery stent 02/2015, R renal artery patent on angiogram  . Stroke ([place])   . TIA (transient ischemic attack) ~ 2013  . Type II diabetes mellitus ([place])    Past Surgical History:  Procedure Laterality Date  . ABDOMINAL AORTAGRAM N/A 12/16/2011   Procedure: ABDOMINAL Maxcine Ham;  Surgeon: Sherren Mocha, MD;  Location: [facility] CATH LAB;  Service: Cardiovascular;  Laterality: N/A;  . ANGIOPLASTY / STENTING ILIAC     Left external Iliac Artery  . AORTA - BILATERAL FEMORAL ARTERY BYPASS GRAFT  01/18/2012   Procedure: AORTA BIFEMORAL BYPASS GRAFT;  Surgeon: Curt Jews, MD;  Location: [place];  Service: Vascular;  Laterality: N/A;  . AORTIC VALVE REPLACEMENT  ~ 2004  . CARDIAC CATHETERIZATION  11/2003   /pt report 10/01/2016  . CARDIAC VALVE REPLACEMENT  11/2003   aortic  . CATARACT EXTRACTION W/ INTRAOCULAR LENS  IMPLANT, BILATERAL Bilateral   . COLONOSCOPY    . COLONOSCOPY WITH PROPOFOL N/A 09/03/2019   Procedure: COLONOSCOPY WITH PROPOFOL;  Surgeon: Doran Stabler, MD;  Location: [facility] ENDOSCOPY;  Service: Gastroenterology;  Laterality: N/A;  . CORONARY ARTERY BYPASS GRAFT  11/2003   Archie Endo 04/21/2011  . ESOPHAGOGASTRODUODENOSCOPY (EGD) WITH PROPOFOL N/A 02/27/2019   Procedure: ESOPHAGOGASTRODUODENOSCOPY (EGD) WITH PROPOFOL;  Surgeon: Doran Stabler, MD;  Location: [place];  Service: Endoscopy;  Laterality: N/A;  .  HEMOSTASIS CLIP PLACEMENT  09/03/2019   Procedure: HEMOSTASIS CLIP PLACEMENT;  Surgeon: Doran Stabler, MD;  Location: [facility];  Service: Gastroenterology;;  . LOWER EXTREMITY ANGIOGRAM N/A 02/21/2015   Procedure: LOWER EXTREMITY ANGIOGRAM;  Surgeon: Lorretta Harp, MD;  Location: [facility] CATH LAB;  Service: Cardiovascular;  Laterality: N/A;  . PERIPHERAL VASCULAR CATHETERIZATION N/A  04/22/2015   Procedure: Lower Extremity Angiography;  Surgeon: Lorretta Harp, MD;  Location: [place] CV LAB;  Service: Cardiovascular;  Laterality: N/A;  . PERIPHERAL VASCULAR CATHETERIZATION N/A 08/24/2016   Procedure: Lower Extremity Angiography;  Surgeon: Lorretta Harp, MD;  Location: [place] CV LAB;  Service: Cardiovascular;  Laterality: N/A;  . PERIPHERAL VASCULAR CATHETERIZATION Right 10/01/2016   Procedure: Peripheral Vascular Intervention - STENT;  Surgeon: Lorretta Harp, MD;  Location: [place] CV LAB;  Service: Cardiovascular;  Laterality: Right;  Prox and MID SFA   . POLYPECTOMY    . RENAL ANGIOGRAM N/A 02/21/2015   Procedure: RENAL ANGIOGRAM;  Surgeon: Lorretta Harp, MD;  Location[facility] C[facility] LAB;  Service: Cardiovascular;  Laterality: Bilateral; 6 mm x 12 mm long Herculink balloon expandable stent to the left renal artery  . RENAL ARTERY STENT Left 04/22/2015   dr berry  . SPLENECTOMY  02/2003   Archie Endo 04/21/2011  . TONSILLECTOMY  ~ 1952     A IV Location/Drains/Wounds Patient Lines/Drains/Airways Status   Active Line/Drains/Airways    Name:   Placement date:   Placement time:   Site:   Days:   Peripheral IV 09/16/19 Right Antecubital   09/16/19    1009    Antecubital   less than 1          Intake/Output Last 24 hours No intake or output data in the 24 hours ending 09/16/19 1553  Labs/Imaging Results for orders placed or performed during the hospital encounter of 09/16/19 (from the past 48 hour(s))  Basic metabolic panel     Status: Abnormal   Collection Time: 09/16/19 10:08 AM  Result Value Ref Range   Sodium 135 135 - 145 mmol/L   Potassium 4.4 3.5 - 5.1 mmol/L   Chloride 99 98 - 111 mmol/L   CO2 24 22 - 32 mmol/L   Glucose, Bld 214 (H) 70 - 99 mg/dL   BUN 13 8 - 23 mg/dL   Creatinine, Ser 0.93 0.61 - 1.24 mg/dL   Calcium 9.1 8.9 - 10.3 mg/dL   GFR calc non Af Amer >60 >60 mL/min   GFR calc Af Amer >60 >60 mL/min   Anion gap 12 5 - 15     Comment: Performed at [facility], [place] [address], [place], [place] [address]  CBC     Status: Abnormal   Collection Time: 09/16/19 10:08 AM  Result Value Ref Range   WBC 10.2 4.0 - 10.5 K/uL   RBC 3.66 (L) 4.22 - 5.81 MIL/uL   Hemoglobin 10.2 (L) 13.0 - 17.0 g/dL   HCT 32.7 (L) 39.0 - 52.0 %   MCV 89.3 80.0 - 100.0 fL   MCH 27.9 26.0 - 34.0 pg   MCHC 31.2 30.0 - 36.0 g/dL   RDW 19.2 (H) 11.5 - 15.5 %   Platelets 448 (H) 150 - 400 K/uL   nRBC 1.9 (H) 0.0 - 0.2 %    Comment: Performed at [facility] [address] [place], [place] [address]  Troponin I (High Sensitivity)     Status: Abnormal  Collection Time: 09/16/19 10:08 AM  Result Value Ref Range   Troponin I (High Sensitivity) 242 (HH) <18 ng/L    Comment: CRITICAL RESULT CALLED TO, READ BACK BY AND VERIFIED WITH: Cindra Presume [id number] WILDERK (NOTE) Elevated high sensitivity troponin I (hsTnI) values and significant  changes across serial measurements may suggest ACS but many other  chronic and acute conditions are known to elevate hsTnI results.  Refer to the Links section for chest pain algorithms and additional  guidance. Performed at [facility], [place] [address], [place], [place] [address]   Hepatic function panel     Status: None   Collection Time: 09/16/19 10:08 AM  Result Value Ref Range   Total Protein 7.0 6.5 - 8.1 g/dL   Albumin 3.7 3.5 - 5.0 g/dL   AST 31 15 - 41 U/L   ALT 29 0 - 44 U/L   Alkaline Phosphatase 61 38 - 126 U/L   Total Bilirubin 0.8 0.3 - 1.2 mg/dL   Bilirubin, Direct 0.2 0.0 - 0.2 mg/dL   Indirect Bilirubin 0.6 0.3 - 0.9 mg/dL    Comment: Performed at [facility] [address] [place], [place] [address]  Lipase, blood     Status: None   Collection Time: 09/16/19 10:08 AM  Result Value Ref Range   Lipase 35 11 - 51 U/L    Comment: Performed at [facility] [address], [place], [place] [address]  Brain natriuretic peptide     Status: Abnormal    Collection Time: 09/16/19 10:08 AM  Result Value Ref Range   B Natriuretic Peptide 676.4 (H) 0.0 - 100.0 pg/mL    Comment: Performed at [facility] [address] [place], [place] [address]  Protime-INR     Status: Abnormal   Collection Time: 09/16/19 10:08 AM  Result Value Ref Range   Prothrombin Time 21.3 (H) 11.4 - 15.2 seconds   INR 1.9 (H) 0.8 - 1.2    Comment: (NOTE) INR goal varies based on device and disease states. Performed at [facility], [place] [address], [place], [place] [address]   Lactic acid, plasma     Status: None   Collection Time: 09/16/19 10:52 AM  Result Value Ref Range   Lactic Acid, Venous 1.3 0.5 - 1.9 mmol/L    Comment: Performed at [facility] [address], [place], [place] [address]  Troponin I (High Sensitivity)     Status: Abnormal   Collection Time: 09/16/19 12:21 PM  Result Value Ref Range   Troponin I (High Sensitivity) 225 (HH) <18 ng/L    Comment: CRITICAL VALUE NOTED.  VALUE IS CONSISTENT WITH PREVIOUSLY REPORTED AND CALLED VALUE. (NOTE) Elevated high sensitivity troponin I (hsTnI) values and significant  changes across serial measurements may suggest ACS but many other  chronic and acute conditions are known to elevate hsTnI results.  Refer to the Links section for chest pain algorithms and additional  guidance. Performed at [facility], [place] [address], [place], [place] [address]   Urinalysis, Routine w reflex microscopic     Status: Abnormal   Collection Time: 09/16/19 12:30 PM  Result Value Ref Range   Color, Urine YELLOW YELLOW   APPearance CLEAR CLEAR   Specific Gravity, Urine 1.027 1.005 - 1.030   pH 7.0 5.0 - 8.0   Glucose, UA >=500 (A) NEGATIVE mg/dL   Hgb urine dipstick NEGATIVE NEGATIVE   Bilirubin Urine NEGATIVE  NEGATIVE   Ketones, ur 5 (A) NEGATIVE mg/dL   Protein, ur NEGATIVE NEGATIVE mg/dL   Nitrite NEGATIVE NEGATIVE   Leukocytes,Ua NEGATIVE NEGATIVE   RBC / HPF 0-5 0 - 5 RBC/hpf   WBC,  UA 0-5 0 - 5 WBC/hpf   Bacteria, UA NONE SEEN NONE SEEN    Comment: Performed at [facility] [address] [place], [place] [address]  POC occult blood, ED Provider will collect     Status: None   Collection Time: 09/16/19  3:32 PM  Result Value Ref Range   Fecal Occult Bld NEGATIVE NEGATIVE   Dg Chest 2 View  Result Date: 09/16/2019 CLINICAL DATA:  Short of breath. Chest pain for 2 weeks. EXAM: CHEST - 2 VIEW COMPARISON:  02/23/2019 FINDINGS: Numerous leads and wires project over the chest. Midline trachea. Mild cardiomegaly. Atherosclerosis in the transverse aorta. Prior median sternotomy. No pleural effusion or pneumothorax. Hyperinflation mild interstitial thickening. No lobar consolidation. IMPRESSION: No acute cardiopulmonary disease. Hyperinflation interstitial thickening, likely due to smoking/chronic bronchitis. Electronically Signed   By: Abigail Miyamoto M.D.   On: 09/16/2019 11:11   Ct Angio Chest Pe W/cm &/or Wo Cm  Result Date: 09/16/2019 CLINICAL DATA:  Chest pain with tightness and burning sensations as well as palpitations for the past 4-5 days. EXAM: CT ANGIOGRAPHY CHEST WITH CONTRAST TECHNIQUE: Multidetector CT imaging of the chest was performed using the standard protocol during bolus administration of intravenous contrast. Multiplanar CT image reconstructions and MIPs were obtained to evaluate the vascular anatomy. CONTRAST:  53mL OMNIPAQUE IOHEXOL 350 MG/ML SOLN COMPARISON:  Chest radiographs obtained earlier today. FINDINGS: Cardiovascular: Post CABG changes. Dense atheromatous arterial calcifications, including the aorta and coronary arteries. Prosthetic aortic valve. Normally opacified pulmonary arteries with no pulmonary arterial filling defects seen. Mediastinum/Nodes: No enlarged mediastinal, hilar, or axillary lymph nodes. Thyroid gland, trachea, and esophagus demonstrate no significant findings. Lungs/Pleura: Small right pleural effusion and minimal left pleural  effusion. Minimal patchy interstitial prominence in both lungs with minimal centrilobular bullous changes. Upper Abdomen: Surgically absent spleen. Mildly nodular liver contours with a prominent caudate lobe and prominent lateral segment left lobe. Dense atheromatous arterial calcifications, including both proximal renal arteries an the superior mesenteric artery. Musculoskeletal: Minimal thoracic spine degenerative changes. Review of the MIP images confirms the above findings. IMPRESSION: 1. No pulmonary emboli. 2. Small right pleural effusion and minimal left pleural effusion. 3. Minimal patchy interstitial prominence in both lungs, compatible with minimal interstitial pulmonary edema. 4. Minimal changes of COPD and chronic bronchitis. 5. Post CABG changes with dense calcific coronary artery and aortic atherosclerosis. Aortic Atherosclerosis (ICD10-I70.0) and Emphysema (ICD10-J43.9). Electronically Signed   By: Claudie Revering M.D.   On: 09/16/2019 12:10    Pending Labs Unresulted Labs (From admission, onward)    Start     Ordered   09/17/19 0500  Heparin level (unfractionated)  Daily,   R     09/16/19 1238   09/17/19 0500  CBC  Daily,   R     09/16/19 1238   09/16/19 2030  Heparin level (unfractionated)  Once-Timed,   STAT     09/16/19 1238   09/16/19 1148  SARS CORONAVIRUS 2 (TAT 6-24 HRS) Nasopharyngeal Nasopharyngeal Swab  (Asymptomatic/Tier 2 Patients Labs)  Once,   STAT    Question Answer Comment  Is this test for diagnosis or screening Screening   Symptomatic for COVID-19 as defined by CDC No   Hospitalized for COVID-19 No   Admitted  to ICU for COVID-19 No   Previously tested for COVID-19 Yes   Resident in a congregate (group) care setting No   Employed in healthcare setting No      09/16/19 1147   Signed and Held  Basic metabolic panel  Tomorrow morning,   R     Signed and Held   Signed and Held  Lipid panel  Tomorrow morning,   R     Signed and Held   Signed and Held  CBC  Tomorrow  morning,   R     Signed and Held          Vitals/Pain Today's Vitals   09/16/19 1203 09/16/19 1345 09/16/19 1500 09/16/19 1530  BP: 125/60 (!) 139/51 (!) 128/47 (!) 143/45  Pulse: 76 72 72   Resp: 18 16 14 19   Temp:      TempSrc:      SpO2: 98% 98% 98%   Weight:      Height:      PainSc:        Isolation Precautions No active isolations  Medications Medications  heparin ADULT infusion 100 units/mL (25000 units/279mL sodium chloride 0.45%) (1,150 Units/hr Intravenous New Bag/Given 09/16/19 1223)  furosemide (LASIX) injection 40 mg (has no administration in time range)  iohexol (OMNIPAQUE) 350 MG/ML injection 75 mL (75 mLs Intravenous Contrast Given 09/16/19 1138)  furosemide (LASIX) injection 40 mg (40 mg Intravenous Given 09/16/19 1525)    Mobility walks Low fall risk   Focused Assessments Cardiac Assessment Handoff:  Cardiac Rhythm: Normal sinus rhythm Lab Results  Component Value Date   TROPONINI <0.03 02/23/2019   No results found for: DDIMER Does the Patient currently have chest pain? No     R Recommendations: See Admitting Provider Note  Report given to:   Additional Notes:

## 2019-09-16 NOTE — Progress Notes (Signed)
Patient arrives to room 3E15 at this time via stretcher from ED.  Patient independent from stretcher to bed where he is sitting on the  Side with siderails up x2 and callbell in reach.

## 2019-09-16 NOTE — ED Triage Notes (Signed)
Patient arrives c/o central chest tightness/burning with palpitations x 4-5 days. Patient states he was recently seen at [facility] and admitted for low hemoglobin but never required a transfusion. Patient also reports shortness of breath with exertion.

## 2019-09-16 NOTE — Progress Notes (Signed)
[place] NOTE  Pharmacy Consult;  Heparin Indication:  History of mechanical AVR + ACS  No Known Allergies  Patient Measurements: Height: 5\' 8"  (172.7 cm) Weight: 166 lb 0.1 oz (75.3 kg) IBW/kg (Calculated) : 68.4 Heparin Dosing Weight: 75 kg  Vital Signs: Temp: 99.1 F (37.3 C) (10/10 2033) Temp Source: Oral (10/10 2033) BP: 113/59 (10/10 2033) Pulse Rate: 84 (10/10 2033)  Labs: Recent Labs    09/16/19 1008 09/16/19 1221 09/16/19 1646 09/16/19 1849 09/16/19 2035  HGB 10.2*  --   --   --   --   HCT 32.7*  --   --   --   --   PLT 448*  --   --   --   --   LABPROT 21.3*  --   --   --   --   INR 1.9*  --   --   --   --   HEPARINUNFRC  --   --   --   --  <0.10*  CREATININE 0.93  --   --   --   --   TROPONINIHS 242* 225* 265* 232*  --     Estimated Creatinine Clearance: 67.4 mL/min (by C-G formula based on SCr of 0.93 mg/dL).   Medical History: Past Medical History:  Diagnosis Date  . Adenomatous colon polyp 09/1997  . Anemia   . Aortic stenosis    s/p st. jude mechanical AVR - Chronic Coumadin  . Blood transfusion    "related to ITP"  . Coronary artery disease    s/p cabg x 3 11/2003: lima-lad, seq vg to rpda and rpl  . Diverticulitis of colon   . Heart murmur   . Hyperlipidemia   . Hypertension   . ITP (idiopathic thrombocytopenic purpura)   . Peripheral arterial disease ([place])    a. history of aortobifemoral bypass grafting by Dr. Sherren Mocha early b. LE angiography 04/22/2015 patent aortobifem graft, DES to R SFA  . Peripheral vascular disease ([place])    s/p Left external Iliac Artery stenting and subsequent left femoral endarterectomy 02/2011- post op course complicated by wound infxn req I&D 03/2011  . Renal artery stenosis, native, bilateral (HCC)    a. bilateral renal artery stenosis by recent duplex ultrasound b. L renal artery stent 02/2015, R renal artery patent on angiogram  . Stroke ([place])   . TIA (transient ischemic attack) ~ 2013  . Type II  diabetes mellitus (HCC)      Assessment: 52 YOM with history of mechanical AVR and CVA on Coumadin PTA.  He presented to the ED with chest pain and Pharmacy consulted to transition anticoagulation to IV heparin.  INR is slightly sub-therapeutic at 1.9 on admit.  Noted patient recently had rectal bleeding and polyps were resected during colonoscopy on 08/30/19.   Heparin level this evening is undetectable on heparin 1150 units/hr.  Spoke to RN, no known issues with IV infusion.  No overt bleeding or complications noted.  Goal of Therapy:  Heparin level 0.3-0.7 units/ml Monitor platelets by anticoagulation protocol: Yes   Plan:  Increase heparin gtt to 1350 units/hr. Recheck heparin level in 8 hrs. Daily heparin level and CBC.  Marguerite Olea, [facility] Clinical Pharmacist Phone [phone]  09/16/2019 9:59 PM

## 2019-09-16 NOTE — Progress Notes (Signed)
ANTICOAGULATION CONSULT NOTE - Initial Consult  Pharmacy Consult;  Heparin Indication:  History of mechanical AVR + ACS  No Known Allergies  Patient Measurements: Height: 5\' 8"  (172.7 cm) Weight: 166 lb 0.1 oz (75.3 kg) IBW/kg (Calculated) : 68.4 Heparin Dosing Weight: 75 kg  Vital Signs: Temp: 98.5 F (36.9 C) (10/10 0958) Temp Source: Oral (10/10 0958) BP: 112/43 (10/10 1030) Pulse Rate: 77 (10/10 1030)  Labs: Recent Labs    09/16/19 1008  HGB 10.2*  HCT 32.7*  PLT 448*  LABPROT 21.3*  INR 1.9*  CREATININE 0.93  TROPONINIHS 242*    Estimated Creatinine Clearance: 67.4 mL/min (by C-G formula based on SCr of 0.93 mg/dL).   Medical History: Past Medical History:  Diagnosis Date  . Adenomatous colon polyp 09/1997  . Anemia   . Aortic stenosis    s/p st. jude mechanical AVR - Chronic Coumadin  . Blood transfusion    "related to ITP"  . Coronary artery disease    s/p cabg x 3 11/2003: lima-lad, seq vg to rpda and rpl  . Diverticulitis of colon   . Heart murmur   . Hyperlipidemia   . Hypertension   . ITP (idiopathic thrombocytopenic purpura)   . Peripheral arterial disease ([place])    a. history of aortobifemoral bypass grafting by Dr. Sherren Mocha early b. LE angiography 04/22/2015 patent aortobifem graft, DES to R SFA  . Peripheral vascular disease ([place])    s/p Left external Iliac Artery stenting and subsequent left femoral endarterectomy 02/2011- post op course complicated by wound infxn req I&D 03/2011  . Renal artery stenosis, native, bilateral (HCC)    a. bilateral renal artery stenosis by recent duplex ultrasound b. L renal artery stent 02/2015, R renal artery patent on angiogram  . Stroke ([place])   . TIA (transient ischemic attack) ~ 2013  . Type II diabetes mellitus (HCC)      Assessment: 89 YOM with history of mechanical AVR and CVA on Coumadin PTA.  He presented to the ED with chest pain and Pharmacy consulted to transition anticoagulation to IV heparin.  INR is  slightly sub-therapeutic at 1.9 on admit.  Noted patient recently had rectal bleeding and polyps were resected during colonoscopy on 08/30/19.   Goal of Therapy:  Heparin level 0.3-0.7 units/ml Monitor platelets by anticoagulation protocol: Yes   Plan:  Heparin gtt at 1150 units/hr, no bolus with INR 1.9 and bleeding risk Check 8 hr heparin level Daily heparin level and CBC  Lovett Coffin D. Mina Marble, PharmD, BCPS, [place] 09/16/2019, 12:39 PM

## 2019-09-16 NOTE — H&P (Addendum)
Cardiology Admission History and Physical:   Patient ID: [name] [name] MRN: [id number]; DOB: 18-Aug-1946   Admission date: 09/16/2019  Primary Care Provider: Shon Baton, MD Primary Cardiologist: Jenkins Rouge, MD  PV: Dr. Gwenlyn Found   Chief Complaint:  CP, SOB and palpitations   Patient Profile:   [name] [name] is a 73 y.o. male with  hx of CAD status post CABG with mechanical AVR in 2004, on coumadin, DM, HTN, HLD, ITP, PAD s/p aobifem, L iliac stent, TIA and recurrent GI bleed seen for chest pain.   Last echocardiogram January 2019 showed normal LV function, normal-appearing mechanical AVR with no perivalvular regurgitation.  Patient has extensive history of peripheral vascular disease and followed by Dr. Gwenlyn Found with multiple intervention.  He was last seen March 2020 when admitted for acute GI bleed in setting of supratherapeutic INR.  He received vitamin K.  History of Present Illness:   [name] [name] was admitted last month 9/26-9/28 for GI bleed after EGD and colonoscopy. Repeat colonoscopy 9/27 with findings of solitary ulcers in the cecum and proximal colon s/p clipping; discharged on Coumadin with Lovenox bridge.  Discharge patient has not felt well.  Low energy.  He reports shortness of breath and chest discomfort with exertion.  Progressively worsened.  He had worse episode yesterday while walking to drugstore.  He suddenly felt her heart races with chest pain and shortness of breath.  Symptoms resolved within a few minutes.  Due to ongoing symptoms he came to ER for further evaluation this morning.  High-sensitivity troponin 242>>225.  BNP 676.  Hemoglobin 10.2 (stable from last discharge). INR 1.9.  He underwent CT angiogram of chest which was negative for pulmonary embolism but minimal pulmonary edema. Started in heparin by ER provider.   Heart Pathway Score:     Past Medical History:  Diagnosis Date   Adenomatous colon polyp 09/1997   Anemia    Aortic  stenosis    s/p [facility] mechanical AVR - Chronic Coumadin   Blood transfusion    "related to ITP"   Coronary artery disease    s/p cabg x 3 11/2003: lima-lad, seq vg to rpda and rpl   Diverticulitis of colon    Heart murmur    Hyperlipidemia    Hypertension    ITP (idiopathic thrombocytopenic purpura)    Peripheral arterial disease ([place])    a. history of aortobifemoral bypass grafting by Dr. Sherren Mocha early b. LE angiography 04/22/2015 patent aortobifem graft, DES to R SFA   Peripheral vascular disease ([place])    s/p Left external Iliac Artery stenting and subsequent left femoral endarterectomy 02/2011- post op course complicated by wound infxn req I&D 03/2011   Renal artery stenosis, native, bilateral ([place])    a. bilateral renal artery stenosis by recent duplex ultrasound b. L renal artery stent 02/2015, R renal artery patent on angiogram   Stroke [facility])    TIA (transient ischemic attack) ~ 2013   Type II diabetes mellitus ([place])     Past Surgical History:  Procedure Laterality Date   ABDOMINAL AORTAGRAM N/A 12/16/2011   Procedure: ABDOMINAL Maxcine Ham;  Surgeon: Sherren Mocha, MD;  Location: [facility] CATH LAB;  Service: Cardiovascular;  Laterality: N/A;   ANGIOPLASTY / STENTING ILIAC     Left external Iliac Artery   AORTA - BILATERAL FEMORAL ARTERY BYPASS GRAFT  01/18/2012   Procedure: AORTA BIFEMORAL BYPASS GRAFT;  Surgeon: Curt Jews, MD;  Location: [facility] OR;  Service: Vascular;  Laterality: N/A;   AORTIC  VALVE REPLACEMENT  ~ 2004   CARDIAC CATHETERIZATION  11/2003   /pt report 10/01/2016   CARDIAC VALVE REPLACEMENT  11/2003   aortic   CATARACT EXTRACTION W/ INTRAOCULAR LENS  IMPLANT, BILATERAL Bilateral    COLONOSCOPY     COLONOSCOPY WITH PROPOFOL N/A 09/03/2019   Procedure: COLONOSCOPY WITH PROPOFOL;  Surgeon: Doran Stabler, MD;  Location: [facility] ENDOSCOPY;  Service: Gastroenterology;  Laterality: N/A;   CORONARY ARTERY BYPASS GRAFT  11/2003   Archie Endo 04/21/2011    ESOPHAGOGASTRODUODENOSCOPY (EGD) WITH PROPOFOL N/A 02/27/2019   Procedure: ESOPHAGOGASTRODUODENOSCOPY (EGD) WITH PROPOFOL;  Surgeon: Doran Stabler, MD;  Location: [facility] ENDOSCOPY;  Service: Endoscopy;  Laterality: N/A;   HEMOSTASIS CLIP PLACEMENT  09/03/2019   Procedure: HEMOSTASIS CLIP PLACEMENT;  Surgeon: Doran Stabler, MD;  Location: [facility] ENDOSCOPY;  Service: Gastroenterology;;   LOWER EXTREMITY ANGIOGRAM N/A 02/21/2015   Procedure: LOWER EXTREMITY ANGIOGRAM;  Surgeon: Lorretta Harp, MD;  Location: [facility] CATH LAB;  Service: Cardiovascular;  Laterality: N/A;   PERIPHERAL VASCULAR CATHETERIZATION N/A 04/22/2015   Procedure: Lower Extremity Angiography;  Surgeon: Lorretta Harp, MD;  Location: [place] CV LAB;  Service: Cardiovascular;  Laterality: N/A;   PERIPHERAL VASCULAR CATHETERIZATION N/A 08/24/2016   Procedure: Lower Extremity Angiography;  Surgeon: Lorretta Harp, MD;  Location: [place] CV LAB;  Service: Cardiovascular;  Laterality: N/A;   PERIPHERAL VASCULAR CATHETERIZATION Right 10/01/2016   Procedure: Peripheral Vascular Intervention - STENT;  Surgeon: Lorretta Harp, MD;  Location: [place] CV LAB;  Service: Cardiovascular;  Laterality: Right;  Prox and MID SFA    POLYPECTOMY     RENAL ANGIOGRAM N/A 02/21/2015   Procedure: RENAL ANGIOGRAM;  Surgeon: Lorretta Harp, MD;  Location: [facility] CATH LAB;  Service: Cardiovascular;  Laterality: Bilateral; 6 mm x 12 mm long Herculink balloon expandable stent to the left renal artery   RENAL ARTERY STENT Left 04/22/2015   dr berry   SPLENECTOMY  02/2003   Archie Endo 04/21/2011   TONSILLECTOMY  ~ 1952     Medications Prior to Admission: Prior to Admission medications   Medication Sig Start Date End Date Taking? Authorizing Provider  acidophilus (RISAQUAD) CAPS capsule Take 1 capsule by mouth daily.   Yes [provider]  amLODipine (NORVASC) 10 MG tablet Take 10 mg by mouth daily. 02/11/15  Yes [provider]  atorvastatin (LIPITOR) 80 MG tablet Take 80 mg by mouth daily.    Yes [provider]  canagliflozin (INVOKANA) 100 MG TABS tablet Take 100 mg by mouth daily.   Yes [provider]  glyBURIDE (DIABETA) 2.5 MG tablet Take 5 mg by mouth 2 (two) times daily with a meal.    Yes [provider]  hydrochlorothiazide (MICROZIDE) 12.5 MG capsule TAKE 1 CAPSULE BY MOUTH EVERY DAY Patient taking differently: Take 12.5 mg by mouth daily.  02/02/19  Yes Josue Hector, MD  metoprolol succinate (TOPROL-XL) 50 MG 24 hr tablet TAKE 1 TABLET EVERY DAY WITH OR IMMEDIATELY FOLLOWING A MEAL Patient taking differently: Take 50 mg by mouth daily.  07/20/19  Yes Lorretta Harp, MD  trandolapril ([place]) 4 MG tablet TAKE 1 TABLET BY MOUTH EVERY DAY 07/20/19  Yes Lorretta Harp, MD  warfarin (COUMADIN) 10 MG tablet TAKE AS DIRECTED BY COUMADIN CLINIC Patient taking differently: Take 5-10 mg by mouth See admin instructions. Take 10mg  by mouth on Tues/Thurs/Sun Take 5mg  by mouth on MWFSat 07/14/18  Yes Josue Hector,  MD  enoxaparin (LOVENOX) 80 MG/0.8ML injection Inject 0.8 mLs (80 mg total) into the skin every 12 (twelve) hours. Patient not taking: Reported on 09/16/2019 08/11/19   Josue Hector, MD  Evolocumab (REPATHA SURECLICK) 616 MG/ML SOAJ Inject 140 mg into the skin every 14 (fourteen) days.    [provider]     Allergies:   No Known Allergies  Social History:   Social History   Socioeconomic History   Marital status: Divorced    Spouse name: Not on file   Number of children: 3   Years of education: Not on file   Highest education level: Not on file  Occupational History   Occupation: Retired  Scientist, product/process development strain: Not on file   Food insecurity    Worry: Not on file    Inability: Not on Lexicographer needs    Medical: Not on file    Non-medical: Not on file  Tobacco Use   Smoking status: Light Tobacco Smoker     Years: 30.00    Types: Cigarettes, Cigars    Last attempt to quit: 12/15/1993    Years since quitting: 25.7   Smokeless tobacco: Never Used   Tobacco comment: occasional cigar  Substance and Sexual Activity   Alcohol use: Yes    Alcohol/week: 16.0 standard drinks    Types: 1 Cans of beer, 1 Shots of liquor, 14 Standard drinks or equivalent per week    Comment: drinks 2 martini's a night (2 shots in each)   Drug use: No   Sexual activity: Yes  Lifestyle   Physical activity    Days per week: Not on file    Minutes per session: Not on file   Stress: Not on file  Relationships   Social connections    Talks on phone: Not on file    Gets together: Not on file    Attends religious service: Not on file    Active member of club or organization: Not on file    Attends meetings of clubs or organizations: Not on file    Relationship status: Not on file   Intimate partner violence    Fear of current or ex partner: Not on file    Emotionally abused: Not on file    Physically abused: Not on file    Forced sexual activity: Not on file  Other Topics Concern   Not on file  Social History Narrative   Tries to remain active.  Frequent golfer but claudication limits this.    Family History:  The patient's family history includes Breast cancer in his sister; Coronary artery disease in his mother; Diabetes in his paternal aunt, paternal grandmother, and another family member; Heart disease in his father. There is no history of Colon cancer, Colon polyps, Esophageal cancer, Rectal cancer, or Stomach cancer.    ROS:  Please see the history of present illness. All other ROS reviewed and negative.     Physical Exam/Data:   Vitals:   09/16/19 1100 09/16/19 1130 09/16/19 1203 09/16/19 1345  BP: 104/62 (!) 118/40 125/60 (!) 139/51  Pulse:  73 76 72  Resp: (!) 21 17 18 16   Temp:      TempSrc:      SpO2:  98% 98% 98%  Weight:      Height:       No intake or output data in the 24 hours  ending 09/16/19 1354 Last 3 Weights 09/16/2019 09/03/2019 09/02/2019  Weight (lbs) 166 lb 0.1 oz 166 lb 0.1 oz 166 lb 0.1 oz  Weight (kg) 75.3 kg 75.3 kg 75.3 kg     Body mass index is 25.24 kg/m.  General:  Well nourished, well developed, in no acute distress HEENT: normal Lymph: no adenopathy Neck: no  JVD Endocrine:  No thryomegaly Vascular: No carotid bruits; FA pulses 2+ bilaterally without bruits  Cardiac:  normal S1, S2; RRR; no murmur  Lungs:  clear to auscultation bilaterally, no wheezing, rhonchi or rales  Abd: soft, nontender, no hepatomegaly  Ext: Trace edema Musculoskeletal:  No deformities, BUE and BLE strength normal and equal Skin: warm and dry  Neuro:  CNs 2-12 intact, no focal abnormalities noted Psych:  Normal affect    EKG:  The ECG that was done today was personally reviewed and demonstrates SR at rate at 90 bpm, TWI in lateral leads which is chronic  Relevant CV Studies:  Echo 12/2017 Study Conclusions  - Left ventricle: The cavity size was normal. Wall thickness was   increased in a pattern of moderate LVH. Systolic function was   normal. The estimated ejection fraction was in the range of 60%   to 65%. Left ventricular diastolic function parameters were   normal. - Aortic valve: Normal appearing mechanical AVR with no peri   valvular regurgitation Valve area (VTI): 1.45 cm^2. Valve area   (Vmax): 1.11 cm^2. Valve area (Vmean): 1.32 cm^2. - Mitral valve: Calcified annulus. Mildly thickened leaflets .   Valve area by pressure half-time: 1.22 cm^2. - Atrial septum: No defect or patent foramen ovale was identified.  Laboratory Data:  High Sensitivity Troponin:   Recent Labs  Lab 09/16/19 1008 09/16/19 1221  TROPONINIHS 242* 225*      Chemistry Recent Labs  Lab 09/16/19 1008  NA 135  K 4.4  CL 99  CO2 24  GLUCOSE 214*  BUN 13  CREATININE 0.93  CALCIUM 9.1  GFRNONAA >60  GFRAA >60  ANIONGAP 12    Recent Labs  Lab 09/16/19 1008    PROT 7.0  ALBUMIN 3.7  AST 31  ALT 29  ALKPHOS 61  BILITOT 0.8   Hematology Recent Labs  Lab 09/16/19 1008  WBC 10.2  RBC 3.66*  HGB 10.2*  HCT 32.7*  MCV 89.3  MCH 27.9  MCHC 31.2  RDW 19.2*  PLT 448*   BNP Recent Labs  Lab 09/16/19 1008  BNP 676.4*    Radiology/Studies:  Dg Chest 2 View  Result Date: 09/16/2019 CLINICAL DATA:  Short of breath. Chest pain for 2 weeks. EXAM: CHEST - 2 VIEW COMPARISON:  02/23/2019 FINDINGS: Numerous leads and wires project over the chest. Midline trachea. Mild cardiomegaly. Atherosclerosis in the transverse aorta. Prior median sternotomy. No pleural effusion or pneumothorax. Hyperinflation mild interstitial thickening. No lobar consolidation. IMPRESSION: No acute cardiopulmonary disease. Hyperinflation interstitial thickening, likely due to smoking/chronic bronchitis. Electronically Signed   By: Abigail Miyamoto M.D.   On: 09/16/2019 11:11   Ct Angio Chest Pe W/cm &/or Wo Cm  Result Date: 09/16/2019 CLINICAL DATA:  Chest pain with tightness and burning sensations as well as palpitations for the past 4-5 days. EXAM: CT ANGIOGRAPHY CHEST WITH CONTRAST TECHNIQUE: Multidetector CT imaging of the chest was performed using the standard protocol during bolus administration of intravenous contrast. Multiplanar CT image reconstructions and MIPs were obtained to evaluate the vascular anatomy. CONTRAST:  68mL OMNIPAQUE IOHEXOL 350 MG/ML SOLN COMPARISON:  Chest radiographs obtained earlier today. FINDINGS: Cardiovascular: Post CABG  changes. Dense atheromatous arterial calcifications, including the aorta and coronary arteries. Prosthetic aortic valve. Normally opacified pulmonary arteries with no pulmonary arterial filling defects seen. Mediastinum/Nodes: No enlarged mediastinal, hilar, or axillary lymph nodes. Thyroid gland, trachea, and esophagus demonstrate no significant findings. Lungs/Pleura: Small right pleural effusion and minimal left pleural effusion.  Minimal patchy interstitial prominence in both lungs with minimal centrilobular bullous changes. Upper Abdomen: Surgically absent spleen. Mildly nodular liver contours with a prominent caudate lobe and prominent lateral segment left lobe. Dense atheromatous arterial calcifications, including both proximal renal arteries an the superior mesenteric artery. Musculoskeletal: Minimal thoracic spine degenerative changes. Review of the MIP images confirms the above findings. IMPRESSION: 1. No pulmonary emboli. 2. Small right pleural effusion and minimal left pleural effusion. 3. Minimal patchy interstitial prominence in both lungs, compatible with minimal interstitial pulmonary edema. 4. Minimal changes of COPD and chronic bronchitis. 5. Post CABG changes with dense calcific coronary artery and aortic atherosclerosis. Aortic Atherosclerosis (ICD10-I70.0) and Emphysema (ICD10-J43.9). Electronically Signed   By: Claudie Revering M.D.   On: 09/16/2019 12:10    Assessment and Plan:   1. Chest pain/shortness of breath/palpitation ? NSTEMI Symptom ongoing since discharge 2 weeks ago.  Progressively worsened which is concerning.  Relieved with rest.  Same symptoms occurs only with exertion.  Differential includes ACS, CHF, deconditioning or arrhythmia.  EKG without acute abnormality.  High-sensitivity troponin 242>>225.  BNP 676.  Hemoglobin 10.2 (stable from last discharge).  2.  Recent GI bleed - Hgb stable as below Results for GRAYDON, FOFANA (MRN [id number]) as of 09/16/2019 13:57  Ref. Range 09/02/2019 10:08 09/02/2019 17:01 09/03/2019 00:51 09/03/2019 08:16 09/03/2019 15:34 09/04/2019 04:36 09/06/2019 12:44 09/16/2019 10:08  Hemoglobin Latest Ref Range: 13.0 - 17.0 g/dL 11.8 (L) 11.4 (L) 10.0 (L) 10.2 (L) 9.3 (L) 9.8 (L) 9.8 (L) 10.2 (L)    3.  Mechanical AVR -INR subtherapeutic at 1.9 -Added on heparin>> follow hemoglobin very closely -Normal functioning valve by echocardiogram January 2019  4.  Peripheral  arterial disease -Followed by Dr. Gwenlyn Found  5.  CAD s/p CABG in 2004  Severity of Illness: The appropriate patient status for this patient is OBSERVATION. Observation status is judged to be reasonable and necessary in order to provide the required intensity of service to ensure the patient's safety. The patient's presenting symptoms, physical exam findings, and initial radiographic and laboratory data in the context of their medical condition is felt to place them at decreased risk for further clinical deterioration. Furthermore, it is anticipated that the patient will be medically stable for discharge from the hospital within 2 midnights of admission. The following factors support the patient status of observation.   " The patient's presenting symptoms include CP and SOB " The physical exam findings include LE Edema " The initial radiographic and laboratory data are Elevated BNP and troponin      For questions or updates, please contact [place] Please consult [email or web address] for contact info under        Signed, Leanor Kail, PA  09/16/2019 1:54 PM   The patient was seen and examined, and I agree with the history, physical exam, assessment and plan as documented above, with modifications as noted below. I have also personally reviewed all relevant documentation, old records, labs, and both radiographic and cardiovascular studies. I have also independently interpreted old and new ECG's.  Briefly, this is a 73 year old male with a history of coronary disease and CABG with mechanical aortic valve replacement in 2004.  He also has a history of peripheral arterial disease with numerous interventions.  He was recently hospitalized for a GI bleed and he was found to have solitary ulcers in the cecum and proximal colon.  Since then he has had progressive exertional dyspnea and exertional fatigue.  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  He has had episodic burning sensation in his chest as well.  He does not recall having these symptoms prior to CABG.  High-sensitivity troponins in the 200 range.  While the ECG is abnormal it is unchanged from previous ECGs this year.  CT angiography the chest showed a small right pleural effusion, minimal left pleural effusion, and minimal interstitial pulmonary edema.  He denies paroxysmal nocturnal dyspnea.  BNP mildly elevated at 676.  Hemoglobin is 10.2 and had been 9.8 on 09/06/2019.  There appears to be a component of mild CHF along with anginal symptoms.  We will give 2 doses of IV Lasix today and then plan for a [employer] tomorrow.  We will also obtain an echocardiogram to assess for interval changes in cardiac structure and function.  Continue IV heparin, Toprol-XL and atorvastatin.   Kate Sable, MD, [facility]  09/16/2019 2:48 PM

## 2019-09-16 NOTE — ED Provider Notes (Addendum)
[facility] EMERGENCY DEPARTMENT Provider Note   CSN: [id number] Arrival date & time: 09/16/19  4782     History   Chief Complaint Chief Complaint  Patient presents with  . Chest Pain    HPI [name] [name] is a 73 y.o. male.     HPI Patient reports that since his hospitalization 2 weeks ago for a lower GI bleed, he has been getting increasingly weak and short of breath.  He did require transfusion of blood at that time.  He was anticoagulated on Coumadin due to having a mechanical aortic valve.  Patient reports that he was stabilized and then restarted on Coumadin.  He is taking that.  He denies he is having any dark, black or bloody stool.  He reports that the bleeding problem is resolved.  He reports now however he is getting very short of breath with exertion that typically would not have caused him any problem.  He reports if he climbs his stairs he is getting a tight and squeezing sensation in his central chest as well as shortness of breath.  This has been developing and worsening just over the past day or 2 now.  No fevers or chills.  No productive cough.  He denies pain or swelling in his legs. Past Medical History:  Diagnosis Date  . Adenomatous colon polyp 09/1997  . Anemia   . Aortic stenosis    s/p st. jude mechanical AVR - Chronic Coumadin  . Blood transfusion    "related to ITP"  . Coronary artery disease    s/p cabg x 3 11/2003: lima-lad, seq vg to rpda and rpl  . Diverticulitis of colon   . Heart murmur   . Hyperlipidemia   . Hypertension   . ITP (idiopathic thrombocytopenic purpura)   . Peripheral arterial disease ([place])    a. history of aortobifemoral bypass grafting by Dr. Sherren Mocha early b. LE angiography 04/22/2015 patent aortobifem graft, DES to R SFA  . Peripheral vascular disease ([place])    s/p Left external Iliac Artery stenting and subsequent left femoral endarterectomy 02/2011- post op course complicated by wound infxn req I&D 03/2011   . Renal artery stenosis, native, bilateral (HCC)    a. bilateral renal artery stenosis by recent duplex ultrasound b. L renal artery stent 02/2015, R renal artery patent on angiogram  . Stroke ([place])   . TIA (transient ischemic attack) ~ 2013  . Type II diabetes mellitus [facility])     Patient Active Problem List   Diagnosis Date Noted  . NSTEMI (non-ST elevated myocardial infarction) ([place]) 09/16/2019  . H/O colonoscopy with polypectomy   . Hematochezia   . GI bleed 09/02/2019  . Supratherapeutic INR 02/23/2019  . Gastrointestinal hemorrhage with melena 02/23/2019  . Acute blood loss anemia 02/23/2019  . Symptomatic anemia 02/23/2019  . TIA (transient ischemic attack) 12/20/2017  . Stroke-like symptoms 12/14/2017  . Claudication ([place]) 10/01/2016  . Renal artery arteriosclerosis ([place]) 02/22/2015  . Renal artery stenosis ([place]) 02/21/2015  . Renal artery stenosis, native, bilateral ([place]) 02/08/2015  . H/O mechanical aortic valve replacement 07/20/2014  . Encounter for therapeutic drug monitoring 03/13/2014  . Cerebral infarction ([place]) 05/05/2013  . Aortoiliac occlusive disease ([place]) 01/10/2013  . PVD (peripheral vascular disease) with claudication ([place]) 02/02/2012  . Atherosclerosis of native arteries of extremity with intermittent claudication ([place]) 01/12/2012  . Coronary artery disease   . Aortic stenosis   . Peripheral vascular disease ([place])   . Hypertension   .  Hyperlipidemia   . ITP (idiopathic thrombocytopenic purpura)   . Coronary atherosclerosis 10/21/2010  . Transient cerebral ischemia 10/10/2009  . Diabetes ([place]) 10/22/2008  . HYPERCHOLESTEROLEMIA 10/22/2008  . Immune thrombocytopenic purpura ([place]) 10/22/2008  . Essential hypertension 10/22/2008  . Aortic valve disorder 10/22/2008  . PVD (peripheral vascular disease) ([place]) 10/22/2008  . DIVERTICULITIS, COLON 10/22/2008  . COLONIC POLYPS, HX OF 10/22/2008    Past Surgical History:  Procedure Laterality Date  .  ABDOMINAL AORTAGRAM N/A 12/16/2011   Procedure: ABDOMINAL Maxcine Ham;  Surgeon: Sherren Mocha, MD;  Location: [facility] CATH LAB;  Service: Cardiovascular;  Laterality: N/A;  . ANGIOPLASTY / STENTING ILIAC     Left external Iliac Artery  . AORTA - BILATERAL FEMORAL ARTERY BYPASS GRAFT  01/18/2012   Procedure: AORTA BIFEMORAL BYPASS GRAFT;  Surgeon: Curt Jews, MD;  Location: [facility];  Service: Vascular;  Laterality: N/A;  . AORTIC VALVE REPLACEMENT  ~ 2004  . CARDIAC CATHETERIZATION  11/2003   /pt report 10/01/2016  . CARDIAC VALVE REPLACEMENT  11/2003   aortic  . CATARACT EXTRACTION W/ INTRAOCULAR LENS  IMPLANT, BILATERAL Bilateral   . COLONOSCOPY    . COLONOSCOPY WITH PROPOFOL N/A 09/03/2019   Procedure: COLONOSCOPY WITH PROPOFOL;  Surgeon: Doran Stabler, MD;  Location: [facility] ENDOSCOPY;  Service: Gastroenterology;  Laterality: N/A;  . CORONARY ARTERY BYPASS GRAFT  11/2003   Archie Endo 04/21/2011  . ESOPHAGOGASTRODUODENOSCOPY (EGD) WITH PROPOFOL N/A 02/27/2019   Procedure: ESOPHAGOGASTRODUODENOSCOPY (EGD) WITH PROPOFOL;  Surgeon: Doran Stabler, MD;  Location: [place];  Service: Endoscopy;  Laterality: N/A;  . HEMOSTASIS CLIP PLACEMENT  09/03/2019   Procedure: HEMOSTASIS CLIP PLACEMENT;  Surgeon: Doran Stabler, MD;  Location: [facility];  Service: Gastroenterology;;  . LOWER EXTREMITY ANGIOGRAM N/A 02/21/2015   Procedure: LOWER EXTREMITY ANGIOGRAM;  Surgeon: Lorretta Harp, MD;  Location: [facility] CATH LAB;  Service: Cardiovascular;  Laterality: N/A;  . PERIPHERAL VASCULAR CATHETERIZATION N/A 04/22/2015   Procedure: Lower Extremity Angiography;  Surgeon: Lorretta Harp, MD;  Location: [place] CV LAB;  Service: Cardiovascular;  Laterality: N/A;  . PERIPHERAL VASCULAR CATHETERIZATION N/A 08/24/2016   Procedure: Lower Extremity Angiography;  Surgeon: Lorretta Harp, MD;  Location: [place] CV LAB;  Service: Cardiovascular;  Laterality: N/A;  . PERIPHERAL VASCULAR CATHETERIZATION Right  10/01/2016   Procedure: Peripheral Vascular Intervention - STENT;  Surgeon: Lorretta Harp, MD;  Location: [place] CV LAB;  Service: Cardiovascular;  Laterality: Right;  Prox and MID SFA   . POLYPECTOMY    . RENAL ANGIOGRAM N/A 02/21/2015   Procedure: RENAL ANGIOGRAM;  Surgeon: Lorretta Harp, MD;  Location: [facility] CATH LAB;  Service: Cardiovascular;  Laterality: Bilateral; 6 mm x 12 mm long Herculink balloon expandable stent to the left renal artery  . RENAL ARTERY STENT Left 04/22/2015   dr berry  . SPLENECTOMY  02/2003   Archie Endo 04/21/2011  . TONSILLECTOMY  ~ 1952        Home Medications    Prior to Admission medications   Medication Sig Start Date End Date Taking? Authorizing Provider  acidophilus (RISAQUAD) CAPS capsule Take 1 capsule by mouth daily.   Yes [provider]  amLODipine (NORVASC) 10 MG tablet Take 10 mg by mouth daily. 02/11/15  Yes [provider]  atorvastatin (LIPITOR) 80 MG tablet Take 80 mg by mouth daily.    Yes [provider]  canagliflozin (INVOKANA) 100 MG TABS tablet Take 100 mg by mouth daily.  Yes [provider]  glyBURIDE (DIABETA) 2.5 MG tablet Take 5 mg by mouth 2 (two) times daily with a meal.    Yes [provider]  hydrochlorothiazide (MICROZIDE) 12.5 MG capsule TAKE 1 CAPSULE BY MOUTH EVERY DAY Patient taking differently: Take 12.5 mg by mouth daily.  02/02/19  Yes Josue Hector, MD  metoprolol succinate (TOPROL-XL) 50 MG 24 hr tablet TAKE 1 TABLET EVERY DAY WITH OR IMMEDIATELY FOLLOWING A MEAL Patient taking differently: Take 50 mg by mouth daily.  07/20/19  Yes Lorretta Harp, MD  trandolapril ([place]) 4 MG tablet TAKE 1 TABLET BY MOUTH EVERY DAY 07/20/19  Yes Lorretta Harp, MD  warfarin (COUMADIN) 10 MG tablet TAKE AS DIRECTED BY COUMADIN CLINIC Patient taking differently: Take 5-10 mg by mouth See admin instructions. Take 10mg  by mouth on Tues/Thurs/Sun Take 5mg  by mouth on MWFSat 07/14/18  Yes  Josue Hector, MD  enoxaparin (LOVENOX) 80 MG/0.8ML injection Inject 0.8 mLs (80 mg total) into the skin every 12 (twelve) hours. Patient not taking: Reported on 09/16/2019 08/11/19   Josue Hector, MD  Evolocumab (REPATHA SURECLICK) 811 MG/ML SOAJ Inject 140 mg into the skin every 14 (fourteen) days.    [provider]    Family History Family History  Problem Relation Age of Onset  . Coronary artery disease Mother        bypass surgery - deceased  . Heart disease Father        murmur, valve replacement - deceased  . Breast cancer Sister   . Diabetes Other        grandmother  . Diabetes Paternal Grandmother   . Diabetes Paternal Aunt   . Colon cancer Neg Hx   . Colon polyps Neg Hx   . Esophageal cancer Neg Hx   . Rectal cancer Neg Hx   . Stomach cancer Neg Hx     Social History Social History   Tobacco Use  . Smoking status: Light Tobacco Smoker    Years: 30.00    Types: Cigarettes, Cigars    Last attempt to quit: 12/15/1993    Years since quitting: 25.7  . Smokeless tobacco: Never Used  . Tobacco comment: occasional cigar  Substance Use Topics  . Alcohol use: Yes    Alcohol/week: 16.0 standard drinks    Types: 1 Cans of beer, 1 Shots of liquor, 14 Standard drinks or equivalent per week    Comment: drinks 2 martini's a night (2 shots in each)  . Drug use: No     Allergies   Patient has no known allergies.   Review of Systems Review of Systems  10 Systems reviewed and are negative for acute change except as noted in the HPI.  Physical Exam Updated Vital Signs BP (!) 128/47   Pulse 72   Temp 98.5 F (36.9 C) (Oral)   Resp 14   Ht 5\' 8"  (1.727 m)   Wt 75.3 kg   SpO2 98%   BMI 25.24 kg/m   Physical Exam Constitutional:      Comments: Alert and appropriate at rest.  No respiratory distress at rest.  Color good.  Mental status clear.  HENT:     Head: Normocephalic and atraumatic.  Eyes:     Extraocular Movements: Extraocular movements  intact.  Cardiovascular:     Comments: Heart is regular.  Mechanical closure of aortic valve present. Pulmonary:     Effort: Pulmonary effort is normal.     Breath  sounds: Normal breath sounds.  Abdominal:     General: There is no distension.     Palpations: Abdomen is soft.     Tenderness: There is no abdominal tenderness.  Genitourinary:    Comments: Patient has normal rectal exam.  Brown-yellow stool in the vault.  No melena or visible blood. Musculoskeletal:     Comments: Trace pitting edema bilateral lower extremities pretibial.  Left lower extremity appears slightly more swollen than right.  No tenderness to palpation.  Extremities are warm and dry.  Neurological:     General: No focal deficit present.     Mental Status: He is oriented to person, place, and time.     Cranial Nerves: No cranial nerve deficit.     Coordination: Coordination normal.  Psychiatric:        Mood and Affect: Mood normal.      ED Treatments / Results  Labs (all labs ordered are listed, but only abnormal results are displayed) Labs Reviewed  BASIC METABOLIC PANEL - Abnormal; Notable for the following components:      Result Value   Glucose, Bld 214 (*)    All other components within normal limits  CBC - Abnormal; Notable for the following components:   RBC 3.66 (*)    Hemoglobin 10.2 (*)    HCT 32.7 (*)    RDW 19.2 (*)    Platelets 448 (*)    nRBC 1.9 (*)    All other components within normal limits  BRAIN NATRIURETIC PEPTIDE - Abnormal; Notable for the following components:   B Natriuretic Peptide 676.4 (*)    All other components within normal limits  PROTIME-INR - Abnormal; Notable for the following components:   Prothrombin Time 21.3 (*)    INR 1.9 (*)    All other components within normal limits  URINALYSIS, ROUTINE W REFLEX MICROSCOPIC - Abnormal; Notable for the following components:   Glucose, UA >=500 (*)    Ketones, ur 5 (*)    All other components within normal limits   TROPONIN I (HIGH SENSITIVITY) - Abnormal; Notable for the following components:   Troponin I (High Sensitivity) 242 (*)    All other components within normal limits  TROPONIN I (HIGH SENSITIVITY) - Abnormal; Notable for the following components:   Troponin I (High Sensitivity) 225 (*)    All other components within normal limits  SARS CORONAVIRUS 2 (TAT 6-24 HRS)  HEPATIC FUNCTION PANEL  LIPASE, BLOOD  LACTIC ACID, PLASMA  HEPARIN LEVEL (UNFRACTIONATED)  POC OCCULT BLOOD, ED    EKG EKG Interpretation  Date/Time:  Saturday September 16 2019 09:57:59 EDT Ventricular Rate:  90 PR Interval:    QRS Duration: 102 QT Interval:  338 QTC Calculation: 414 R Axis:   83 Text Interpretation:  Sinus rhythm Prolonged PR interval Borderline right axis deviation Probable anteroseptal infarct, old Repol abnrm suggests ischemia, lateral leads agree, no sig change from previous Confirmed by Charlesetta Shanks [id number]) on 09/16/2019 10:17:19 AM   Radiology Dg Chest 2 View  Result Date: 09/16/2019 CLINICAL DATA:  Short of breath. Chest pain for 2 weeks. EXAM: CHEST - 2 VIEW COMPARISON:  02/23/2019 FINDINGS: Numerous leads and wires project over the chest. Midline trachea. Mild cardiomegaly. Atherosclerosis in the transverse aorta. Prior median sternotomy. No pleural effusion or pneumothorax. Hyperinflation mild interstitial thickening. No lobar consolidation. IMPRESSION: No acute cardiopulmonary disease. Hyperinflation interstitial thickening, likely due to smoking/chronic bronchitis. Electronically Signed   By: Abigail Miyamoto M.D.   On: 09/16/2019 11:11  Ct Angio Chest Pe W/cm &/or Wo Cm  Result Date: 09/16/2019 CLINICAL DATA:  Chest pain with tightness and burning sensations as well as palpitations for the past 4-5 days. EXAM: CT ANGIOGRAPHY CHEST WITH CONTRAST TECHNIQUE: Multidetector CT imaging of the chest was performed using the standard protocol during bolus administration of intravenous contrast.  Multiplanar CT image reconstructions and MIPs were obtained to evaluate the vascular anatomy. CONTRAST:  5mL OMNIPAQUE IOHEXOL 350 MG/ML SOLN COMPARISON:  Chest radiographs obtained earlier today. FINDINGS: Cardiovascular: Post CABG changes. Dense atheromatous arterial calcifications, including the aorta and coronary arteries. Prosthetic aortic valve. Normally opacified pulmonary arteries with no pulmonary arterial filling defects seen. Mediastinum/Nodes: No enlarged mediastinal, hilar, or axillary lymph nodes. Thyroid gland, trachea, and esophagus demonstrate no significant findings. Lungs/Pleura: Small right pleural effusion and minimal left pleural effusion. Minimal patchy interstitial prominence in both lungs with minimal centrilobular bullous changes. Upper Abdomen: Surgically absent spleen. Mildly nodular liver contours with a prominent caudate lobe and prominent lateral segment left lobe. Dense atheromatous arterial calcifications, including both proximal renal arteries an the superior mesenteric artery. Musculoskeletal: Minimal thoracic spine degenerative changes. Review of the MIP images confirms the above findings. IMPRESSION: 1. No pulmonary emboli. 2. Small right pleural effusion and minimal left pleural effusion. 3. Minimal patchy interstitial prominence in both lungs, compatible with minimal interstitial pulmonary edema. 4. Minimal changes of COPD and chronic bronchitis. 5. Post CABG changes with dense calcific coronary artery and aortic atherosclerosis. Aortic Atherosclerosis (ICD10-I70.0) and Emphysema (ICD10-J43.9). Electronically Signed   By: Claudie Revering M.D.   On: 09/16/2019 12:10    Procedures Procedures (including critical care time) CRITICAL CARE Performed by: Charlesetta Shanks   Total critical care time: 30 minutes  Critical care time was exclusive of separately billable procedures and treating other patients.  Critical care was necessary to treat or prevent imminent or  life-threatening deterioration.  Critical care was time spent personally by me on the following activities: development of treatment plan with patient and/or surrogate as well as nursing, discussions with consultants, evaluation of patient's response to treatment, examination of patient, obtaining history from patient or surrogate, ordering and performing treatments and interventions, ordering and review of laboratory studies, ordering and review of radiographic studies, pulse oximetry and re-evaluation of patient's condition. Medications Ordered in ED Medications  heparin ADULT infusion 100 units/mL (25000 units/255mL sodium chloride 0.45%) (1,150 Units/hr Intravenous New Bag/Given 09/16/19 1223)  furosemide (LASIX) injection 40 mg (has no administration in time range)  iohexol (OMNIPAQUE) 350 MG/ML injection 75 mL (75 mLs Intravenous Contrast Given 09/16/19 1138)  furosemide (LASIX) injection 40 mg (40 mg Intravenous Given 09/16/19 1525)     Initial Impression / Assessment and Plan / ED Course  I have reviewed the triage vital signs and the nursing notes.  Pertinent labs & imaging results that were available during my care of the patient were reviewed by me and considered in my medical decision making (see chart for details).  Clinical Course as of Sep 15 1532  Sat Sep 16, 2019  1140 Consult: Dr. Matthew Folks of cardiology.  They will evaluate patient in the emergency department   [MP]    Clinical Course User Index [MP] Charlesetta Shanks, MD      Patient presents and above.  He has complex medical history per review of EMR.  He had GI bleed on Coumadin after a lower endoscopy.  This necessitated several days of hospitalization for stabilization.  Since then he reports he has felt increased fatigue  and shortness of breath.  Just over the past couple days however he is developing some significant tight feeling in his chest with exertion that typically would not be difficult for him.  This is  been consistent and worse today.  He denies he is having chest pain at rest.  I did have concern for possible PE with recent hospitalization and changes in coagulation.  Patient is mildly subtherapeutic on Coumadin.  PE study does not show any PE present.  Troponin is elevated.  Findings are consistent with acute coronary syndrome.  Will initiate heparin.  Cardiology has been consulted for further care.  Final Clinical Impressions(s) / ED Diagnoses   Final diagnoses:  ACS (acute coronary syndrome) (HCC)  Severe comorbid illness  H/O mechanical aortic valve replacement  H/O: GI bleed    ED Discharge Orders    None       Charlesetta Shanks, MD 09/16/19 1228    Charlesetta Shanks, MD 09/16/19 [phone]

## 2019-09-17 ENCOUNTER — Observation Stay (HOSPITAL_BASED_OUTPATIENT_CLINIC_OR_DEPARTMENT_OTHER): Payer: Medicare Other

## 2019-09-17 DIAGNOSIS — Z9081 Acquired absence of spleen: Secondary | ICD-10-CM | POA: Diagnosis not present

## 2019-09-17 DIAGNOSIS — E785 Hyperlipidemia, unspecified: Secondary | ICD-10-CM | POA: Diagnosis not present

## 2019-09-17 DIAGNOSIS — I5031 Acute diastolic (congestive) heart failure: Secondary | ICD-10-CM | POA: Diagnosis not present

## 2019-09-17 DIAGNOSIS — Z8679 Personal history of other diseases of the circulatory system: Secondary | ICD-10-CM | POA: Diagnosis not present

## 2019-09-17 DIAGNOSIS — I214 Non-ST elevation (NSTEMI) myocardial infarction: Secondary | ICD-10-CM | POA: Diagnosis not present

## 2019-09-17 DIAGNOSIS — Z9841 Cataract extraction status, right eye: Secondary | ICD-10-CM | POA: Diagnosis not present

## 2019-09-17 DIAGNOSIS — F17210 Nicotine dependence, cigarettes, uncomplicated: Secondary | ICD-10-CM | POA: Diagnosis present

## 2019-09-17 DIAGNOSIS — I25708 Atherosclerosis of coronary artery bypass graft(s), unspecified, with other forms of angina pectoris: Secondary | ICD-10-CM | POA: Diagnosis not present

## 2019-09-17 DIAGNOSIS — Z951 Presence of aortocoronary bypass graft: Secondary | ICD-10-CM | POA: Diagnosis not present

## 2019-09-17 DIAGNOSIS — D693 Immune thrombocytopenic purpura: Secondary | ICD-10-CM | POA: Diagnosis present

## 2019-09-17 DIAGNOSIS — I25110 Atherosclerotic heart disease of native coronary artery with unstable angina pectoris: Secondary | ICD-10-CM | POA: Diagnosis not present

## 2019-09-17 DIAGNOSIS — Z86010 Personal history of colonic polyps: Secondary | ICD-10-CM | POA: Diagnosis not present

## 2019-09-17 DIAGNOSIS — Z20828 Contact with and (suspected) exposure to other viral communicable diseases: Secondary | ICD-10-CM | POA: Diagnosis present

## 2019-09-17 DIAGNOSIS — Z952 Presence of prosthetic heart valve: Secondary | ICD-10-CM | POA: Diagnosis not present

## 2019-09-17 DIAGNOSIS — T82856A Stenosis of peripheral vascular stent, initial encounter: Secondary | ICD-10-CM | POA: Diagnosis present

## 2019-09-17 DIAGNOSIS — R079 Chest pain, unspecified: Secondary | ICD-10-CM | POA: Diagnosis not present

## 2019-09-17 DIAGNOSIS — E1151 Type 2 diabetes mellitus with diabetic peripheral angiopathy without gangrene: Secondary | ICD-10-CM | POA: Diagnosis present

## 2019-09-17 DIAGNOSIS — Z8673 Personal history of transient ischemic attack (TIA), and cerebral infarction without residual deficits: Secondary | ICD-10-CM | POA: Diagnosis not present

## 2019-09-17 DIAGNOSIS — K633 Ulcer of intestine: Secondary | ICD-10-CM | POA: Diagnosis present

## 2019-09-17 DIAGNOSIS — R002 Palpitations: Secondary | ICD-10-CM

## 2019-09-17 DIAGNOSIS — I200 Unstable angina: Secondary | ICD-10-CM | POA: Diagnosis present

## 2019-09-17 DIAGNOSIS — R0600 Dyspnea, unspecified: Secondary | ICD-10-CM | POA: Diagnosis not present

## 2019-09-17 DIAGNOSIS — R0789 Other chest pain: Secondary | ICD-10-CM | POA: Diagnosis present

## 2019-09-17 DIAGNOSIS — I493 Ventricular premature depolarization: Secondary | ICD-10-CM | POA: Diagnosis present

## 2019-09-17 DIAGNOSIS — I2510 Atherosclerotic heart disease of native coronary artery without angina pectoris: Secondary | ICD-10-CM | POA: Diagnosis not present

## 2019-09-17 DIAGNOSIS — Z833 Family history of diabetes mellitus: Secondary | ICD-10-CM | POA: Diagnosis not present

## 2019-09-17 DIAGNOSIS — Z8249 Family history of ischemic heart disease and other diseases of the circulatory system: Secondary | ICD-10-CM | POA: Diagnosis not present

## 2019-09-17 DIAGNOSIS — I10 Essential (primary) hypertension: Secondary | ICD-10-CM | POA: Diagnosis not present

## 2019-09-17 DIAGNOSIS — Z961 Presence of intraocular lens: Secondary | ICD-10-CM | POA: Diagnosis present

## 2019-09-17 DIAGNOSIS — Z8719 Personal history of other diseases of the digestive system: Secondary | ICD-10-CM | POA: Diagnosis not present

## 2019-09-17 DIAGNOSIS — R011 Cardiac murmur, unspecified: Secondary | ICD-10-CM | POA: Diagnosis present

## 2019-09-17 DIAGNOSIS — I110 Hypertensive heart disease with heart failure: Secondary | ICD-10-CM | POA: Diagnosis present

## 2019-09-17 DIAGNOSIS — Z9842 Cataract extraction status, left eye: Secondary | ICD-10-CM | POA: Diagnosis not present

## 2019-09-17 DIAGNOSIS — I739 Peripheral vascular disease, unspecified: Secondary | ICD-10-CM | POA: Diagnosis not present

## 2019-09-17 DIAGNOSIS — I083 Combined rheumatic disorders of mitral, aortic and tricuspid valves: Secondary | ICD-10-CM | POA: Diagnosis present

## 2019-09-17 DIAGNOSIS — Y838 Other surgical procedures as the cause of abnormal reaction of the patient, or of later complication, without mention of misadventure at the time of the procedure: Secondary | ICD-10-CM | POA: Diagnosis present

## 2019-09-17 DIAGNOSIS — I5033 Acute on chronic diastolic (congestive) heart failure: Secondary | ICD-10-CM | POA: Diagnosis not present

## 2019-09-17 LAB — LIPID PANEL
Cholesterol: 91 mg/dL (ref 0–200)
HDL: 54 mg/dL (ref >40)
LDL Cholesterol: 28 mg/dL (ref 0–99)
Total CHOL/HDL Ratio: 1.7 RATIO
Triglycerides: 43 mg/dL (ref <150)
VLDL: 9 mg/dL (ref 0–40)

## 2019-09-17 LAB — HEPARIN LEVEL (UNFRACTIONATED)
Heparin Unfractionated: 0.19 IU/mL — ABNORMAL LOW (ref 0.30–0.70)
Heparin Unfractionated: 0.53 IU/mL (ref 0.30–0.70)
Heparin Unfractionated: 0.61 IU/mL (ref 0.30–0.70)

## 2019-09-17 LAB — CBC
HCT: 30.3 % — ABNORMAL LOW (ref 39.0–52.0)
Hemoglobin: 9.4 g/dL — ABNORMAL LOW (ref 13.0–17.0)
MCH: 27.1 pg (ref 26.0–34.0)
MCHC: 31 g/dL (ref 30.0–36.0)
MCV: 87.3 fL (ref 80.0–100.0)
Platelets: 404 10*3/uL — ABNORMAL HIGH (ref 150–400)
RBC: 3.47 MIL/uL — ABNORMAL LOW (ref 4.22–5.81)
RDW: 19 % — ABNORMAL HIGH (ref 11.5–15.5)
WBC: 7.8 10*3/uL (ref 4.0–10.5)
nRBC: 2.2 % — ABNORMAL HIGH (ref 0.0–0.2)

## 2019-09-17 LAB — BASIC METABOLIC PANEL
Anion gap: 14 (ref 5–15)
BUN: 13 mg/dL (ref 8–23)
CO2: 25 mmol/L (ref 22–32)
Calcium: 8.6 mg/dL — ABNORMAL LOW (ref 8.9–10.3)
Chloride: 99 mmol/L (ref 98–111)
Creatinine, Ser: 0.94 mg/dL (ref 0.61–1.24)
GFR calc Af Amer: >60 mL/min (ref >60)
GFR calc non Af Amer: >60 mL/min (ref >60)
Glucose, Bld: 115 mg/dL — ABNORMAL HIGH (ref 70–99)
Potassium: 3.5 mmol/L (ref 3.5–5.1)
Sodium: 138 mmol/L (ref 135–145)

## 2019-09-17 LAB — NM MYOCAR MULTI W/SPECT W/WALL MOTION / EF
Estimated workload: 1 METS
Exercise duration (min): 5 min
Exercise duration (sec): 33 s
Peak HR: 96 {beats}/min
Rest HR: 79 {beats}/min

## 2019-09-17 LAB — ECHOCARDIOGRAM COMPLETE
Height: 68 in
Weight: 2691.2 oz

## 2019-09-17 MED ORDER — FUROSEMIDE 20 MG PO TABS
20.0000 mg | ORAL_TABLET | Freq: Every day | ORAL | Status: DC
  Administered 2019-09-17 – 2019-09-19 (×3): 20 mg via ORAL
  Filled 2019-09-17 (×3): qty 1

## 2019-09-17 MED ORDER — TECHNETIUM TC 99M TETROFOSMIN IV KIT
30.0000 | PACK | Freq: Once | INTRAVENOUS | Status: AC | PRN
  Administered 2019-09-17: 10:00:00 30 via INTRAVENOUS

## 2019-09-17 MED ORDER — TECHNETIUM TC 99M TETROFOSMIN IV KIT
10.0000 | PACK | Freq: Once | INTRAVENOUS | Status: AC | PRN
  Administered 2019-09-17: 10 via INTRAVENOUS

## 2019-09-17 MED ORDER — REGADENOSON 0.4 MG/5ML IV SOLN
INTRAVENOUS | Status: AC
  Filled 2019-09-17: qty 5

## 2019-09-17 MED ORDER — REGADENOSON 0.4 MG/5ML IV SOLN
0.4000 mg | Freq: Once | INTRAVENOUS | Status: AC
  Administered 2019-09-17: 0.4 mg via INTRAVENOUS

## 2019-09-17 NOTE — Progress Notes (Signed)
[facility] [place] NOTE  Pharmacy Consult;  Heparin Indication:  History of mechanical AVR + ACS  No Known Allergies  Patient Measurements: Height: 5\' 8"  (172.7 cm) Weight: 168 lb 3.2 oz (76.3 kg) IBW/kg (Calculated) : 68.4 Heparin Dosing Weight: 75 kg  Vital Signs: Temp: 98.5 F (36.9 C) (10/11 0515) Temp Source: Oral (10/11 0515) BP: 111/54 (10/11 0952) Pulse Rate: 75 (10/11 0515)  Labs: Recent Labs    09/16/19 1008 09/16/19 1221 09/16/19 1646 09/16/19 1849 09/16/19 2035 09/17/19 0613 09/17/19 1608  HGB 10.2*  --   --   --   --  9.4*  --   HCT 32.7*  --   --   --   --  30.3*  --   PLT 448*  --   --   --   --  404*  --   LABPROT 21.3*  --   --   --   --   --   --   INR 1.9*  --   --   --   --   --   --   HEPARINUNFRC  --   --   --   --  <0.10* 0.19* 0.53  CREATININE 0.93  --   --   --   --  0.94  --   TROPONINIHS 242* 225* 265* 232*  --   --   --     Estimated Creatinine Clearance: 66.7 mL/min (by C-G formula based on SCr of 0.94 mg/dL).   Medical History: Past Medical History:  Diagnosis Date  . Adenomatous colon polyp 09/1997  . Anemia   . Aortic stenosis    s/p st. jude mechanical AVR - Chronic Coumadin  . Blood transfusion    "related to ITP"  . Coronary artery disease    s/p cabg x 3 11/2003: lima-lad, seq vg to rpda and rpl  . Diverticulitis of colon   . Heart murmur   . Hyperlipidemia   . Hypertension   . ITP (idiopathic thrombocytopenic purpura)   . Peripheral arterial disease ([place])    a. history of aortobifemoral bypass grafting by Dr. Sherren Mocha early b. LE angiography 04/22/2015 patent aortobifem graft, DES to R SFA  . Peripheral vascular disease ([place])    s/p Left external Iliac Artery stenting and subsequent left femoral endarterectomy 02/2011- post op course complicated by wound infxn req I&D 03/2011  . Renal artery stenosis, native, bilateral (HCC)    a. bilateral renal artery stenosis by recent duplex ultrasound b. L renal artery stent  02/2015, R renal artery patent on angiogram  . Stroke ([place])   . TIA (transient ischemic attack) ~ 2013  . Type II diabetes mellitus (HCC)    Assessment: 52 YOM with history of mechanical AVR and CVA on Coumadin PTA.  He presented to the ED with chest pain and Pharmacy consulted to transition anticoagulation to IV heparin.  INR is slightly sub-therapeutic at 1.9 on admit.  Noted patient recently had rectal bleeding and polyps were resected during colonoscopy on 08/30/19.   Heparin level this evening within goal range.  No overt bleeding or complications noted.  Goal of Therapy:  Heparin level 0.3-0.7 units/ml Monitor platelets by anticoagulation protocol: Yes   Plan:  Continue IV heparin at current rate of 1550 units/hr. Recheck heparin level in 8 hrs. Daily heparin level and CBC.  Marguerite Olea, [facility] Clinical Pharmacist Phone [phone]  09/17/2019 4:39 PM Please check [email or web address] for unit-specific pharmacy phone numbers.

## 2019-09-17 NOTE — Progress Notes (Signed)
Stress test high risk. Plan cath tomorrow. Patient is agree. Pending echo.

## 2019-09-17 NOTE — Progress Notes (Signed)
  Echocardiogram 2D Echocardiogram has been performed.  [name] [name] L Androw 09/17/2019, 12:01 PM

## 2019-09-17 NOTE — Progress Notes (Signed)
[place] NOTE  Pharmacy Consult;  Heparin Indication:  History of mechanical AVR + ACS  No Known Allergies  Patient Measurements: Height: 5\' 8"  (172.7 cm) Weight: 168 lb 3.2 oz (76.3 kg) IBW/kg (Calculated) : 68.4 Heparin Dosing Weight: 75 kg  Vital Signs: Temp: 98.5 F (36.9 C) (10/11 0515) Temp Source: Oral (10/11 0515) BP: 107/42 (10/11 0515) Pulse Rate: 75 (10/11 0515)  Labs: Recent Labs    09/16/19 1008 09/16/19 1221 09/16/19 1646 09/16/19 1849 09/16/19 2035 09/17/19 0613  HGB 10.2*  --   --   --   --  9.4*  HCT 32.7*  --   --   --   --  30.3*  PLT 448*  --   --   --   --  404*  LABPROT 21.3*  --   --   --   --   --   INR 1.9*  --   --   --   --   --   HEPARINUNFRC  --   --   --   --  <0.10* 0.19*  CREATININE 0.93  --   --   --   --   --   TROPONINIHS 242* 225* 265* 232*  --   --     Estimated Creatinine Clearance: 67.4 mL/min (by C-G formula based on SCr of 0.93 mg/dL).   Medical History: Past Medical History:  Diagnosis Date  . Adenomatous colon polyp 09/1997  . Anemia   . Aortic stenosis    s/p st. jude mechanical AVR - Chronic Coumadin  . Blood transfusion    "related to ITP"  . Coronary artery disease    s/p cabg x 3 11/2003: lima-lad, seq vg to rpda and rpl  . Diverticulitis of colon   . Heart murmur   . Hyperlipidemia   . Hypertension   . ITP (idiopathic thrombocytopenic purpura)   . Peripheral arterial disease ([place])    a. history of aortobifemoral bypass grafting by Dr. Sherren Mocha early b. LE angiography 04/22/2015 patent aortobifem graft, DES to R SFA  . Peripheral vascular disease ([place])    s/p Left external Iliac Artery stenting and subsequent left femoral endarterectomy 02/2011- post op course complicated by wound infxn req I&D 03/2011  . Renal artery stenosis, native, bilateral (HCC)    a. bilateral renal artery stenosis by recent duplex ultrasound b. L renal artery stent 02/2015, R renal artery patent on angiogram  . Stroke ([place])    . TIA (transient ischemic attack) ~ 2013  . Type II diabetes mellitus (HCC)    Assessment: 3 YOM with history of mechanical AVR and CVA on Coumadin PTA.  He presented to the ED with chest pain and Pharmacy consulted to transition anticoagulation to IV heparin.  INR is slightly sub-therapeutic at 1.9 on admit.  Noted patient recently had rectal bleeding and polyps were resected during colonoscopy on 08/30/19.   Heparin level this evening remains subtherapeutic on heparin 1350 units/hr.  Hgb low and trending down from 10.2 to 9.4, plts nearly wnl at 404. No bleeding or infusion issues per RN. Will increase dose but not bolus given high bleed risk.  Goal of Therapy:  Heparin level 0.3-0.7 units/ml Monitor platelets by anticoagulation protocol: Yes   Plan:  Increase heparin gtt to 1550 units/hr Recheck heparin level in 8 hrs. Daily heparin level and CBC. Monitor for bleeding  Richardine Service, PharmD PGY1 Pharmacy Resident Phone: [phone] 09/17/2019  7:52 AM  Please check [email or web address] for unit-specific pharmacy  phone numbers.

## 2019-09-17 NOTE — Progress Notes (Signed)
Progress Note  Patient Name: [name] [name] Date of Encounter: 09/17/2019  Primary Cardiologist: Jenkins Rouge, MD   Subjective   He feels better than he did yesterday.  He has no chest pain or shortness of breath at rest.  He just returned from the nuclear stress test.  Inpatient Medications    Scheduled Meds:  regadenoson       acidophilus  1 capsule Oral Daily   amLODipine  10 mg Oral Daily   aspirin EC  81 mg Oral Daily   atorvastatin  80 mg Oral Daily   canagliflozin  100 mg Oral Daily   metoprolol succinate  50 mg Oral Daily   trandolapril  4 mg Oral Daily   Continuous Infusions:  heparin 1,550 Units/hr (09/17/19 0754)   PRN Meds: acetaminophen, nitroGLYCERIN, ondansetron (ZOFRAN) IV   Vital Signs    Vitals:   09/17/19 0946 09/17/19 0947 09/17/19 0949 09/17/19 0952  BP: (!) 126/44 (!) 114/46 (!) 85/63 (!) 111/54  Pulse:      Resp:      Temp:      TempSrc:      SpO2:      Weight:      Height:        Intake/Output Summary (Last 24 hours) at 09/17/2019 1055 Last data filed at 09/17/2019 0831 Gross per 24 hour  Intake 177.27 ml  Output 2600 ml  Net -2422.73 ml   Filed Weights   09/16/19 1006 09/17/19 0515  Weight: 75.3 kg 76.3 kg    Telemetry    NSR - Personally Reviewed   Physical Exam   GEN: No acute distress.   Neck: No JVD Cardiac: RRR, no murmurs, rubs, or gallops.  Respiratory: Clear to auscultation bilaterally. GI: Soft, nontender, non-distended  MS: No edema; No deformity. Neuro:  Nonfocal  Psych: Normal affect   Labs    Chemistry Recent Labs  Lab 09/16/19 1008 09/17/19 0613  NA 135 138  K 4.4 3.5  CL 99 99  CO2 24 25  GLUCOSE 214* 115*  BUN 13 13  CREATININE 0.93 0.94  CALCIUM 9.1 8.6*  PROT 7.0  --   ALBUMIN 3.7  --   AST 31  --   ALT 29  --   ALKPHOS 61  --   BILITOT 0.8  --   GFRNONAA >60 >60  GFRAA >60 >60  ANIONGAP 12 14     Hematology Recent Labs  Lab 09/16/19 1008 09/17/19 0613    WBC 10.2 7.8  RBC 3.66* 3.47*  HGB 10.2* 9.4*  HCT 32.7* 30.3*  MCV 89.3 87.3  MCH 27.9 27.1  MCHC 31.2 31.0  RDW 19.2* 19.0*  PLT 448* 404*    Cardiac EnzymesNo results for input(s): TROPONINI in the last 168 hours. No results for input(s): TROPIPOC in the last 168 hours.   BNP Recent Labs  Lab 09/16/19 1008  BNP 676.4*     DDimer No results for input(s): DDIMER in the last 168 hours.   Radiology    Dg Chest 2 View  Result Date: 09/16/2019 CLINICAL DATA:  Short of breath. Chest pain for 2 weeks. EXAM: CHEST - 2 VIEW COMPARISON:  02/23/2019 FINDINGS: Numerous leads and wires project over the chest. Midline trachea. Mild cardiomegaly. Atherosclerosis in the transverse aorta. Prior median sternotomy. No pleural effusion or pneumothorax. Hyperinflation mild interstitial thickening. No lobar consolidation. IMPRESSION: No acute cardiopulmonary disease. Hyperinflation interstitial thickening, likely due to smoking/chronic bronchitis. Electronically Signed   By: Marylyn Ishihara  Jobe Igo M.D.   On: 09/16/2019 11:11   Ct Angio Chest Pe W/cm &/or Wo Cm  Result Date: 09/16/2019 CLINICAL DATA:  Chest pain with tightness and burning sensations as well as palpitations for the past 4-5 days. EXAM: CT ANGIOGRAPHY CHEST WITH CONTRAST TECHNIQUE: Multidetector CT imaging of the chest was performed using the standard protocol during bolus administration of intravenous contrast. Multiplanar CT image reconstructions and MIPs were obtained to evaluate the vascular anatomy. CONTRAST:  53mL OMNIPAQUE IOHEXOL 350 MG/ML SOLN COMPARISON:  Chest radiographs obtained earlier today. FINDINGS: Cardiovascular: Post CABG changes. Dense atheromatous arterial calcifications, including the aorta and coronary arteries. Prosthetic aortic valve. Normally opacified pulmonary arteries with no pulmonary arterial filling defects seen. Mediastinum/Nodes: No enlarged mediastinal, hilar, or axillary lymph nodes. Thyroid gland, trachea, and  esophagus demonstrate no significant findings. Lungs/Pleura: Small right pleural effusion and minimal left pleural effusion. Minimal patchy interstitial prominence in both lungs with minimal centrilobular bullous changes. Upper Abdomen: Surgically absent spleen. Mildly nodular liver contours with a prominent caudate lobe and prominent lateral segment left lobe. Dense atheromatous arterial calcifications, including both proximal renal arteries an the superior mesenteric artery. Musculoskeletal: Minimal thoracic spine degenerative changes. Review of the MIP images confirms the above findings. IMPRESSION: 1. No pulmonary emboli. 2. Small right pleural effusion and minimal left pleural effusion. 3. Minimal patchy interstitial prominence in both lungs, compatible with minimal interstitial pulmonary edema. 4. Minimal changes of COPD and chronic bronchitis. 5. Post CABG changes with dense calcific coronary artery and aortic atherosclerosis. Aortic Atherosclerosis (ICD10-I70.0) and Emphysema (ICD10-J43.9). Electronically Signed   By: Claudie Revering M.D.   On: 09/16/2019 12:10    Cardiac Studies   Echo pending for this admission   Echo 12/2017 Study Conclusions  - Left ventricle: The cavity size was normal. Wall thickness was increased in a pattern of moderate LVH. Systolic function was normal. The estimated ejection fraction was in the range of 60% to 65%. Left ventricular diastolic function parameters were normal. - Aortic valve: Normal appearing mechanical AVR with no peri valvular regurgitation Valve area (VTI): 1.45 cm^2. Valve area (Vmax): 1.11 cm^2. Valve area (Vmean): 1.32 cm^2. - Mitral valve: Calcified annulus. Mildly thickened leaflets . Valve area by pressure half-time: 1.22 cm^2. - Atrial septum: No defect or patent foramen ovale was identified.  Patient Profile     73 y.o. male with hx of CAD status post CABG with mechanical AVR in 2004, on coumadin, DM, HTN, HLD, ITP, PAD  s/p aobifem, L iliac stent, TIA and recurrent GI bleed seen for chest pain.   Assessment & Plan    1.  Acute diastolic heart failure: He received 2 doses of IV Lasix yesterday and has put out over 2.4 L.  He demonstrates symptomatic improvement.  Echocardiogram ordered and pending.  Lexiscan Myoview results also pending.  I will start oral Lasix 20 mg daily.  2.  Chest pain and exertional dyspnea with history of CABG: Symptoms are stable at rest.  Echocardiogram and Lexiscan Myoview pending.  Continue aspirin, metoprolol succinate, and atorvastatin.  He remains on IV heparin in the event cardiac catheterization is required.  3.  Status post AVR: Mechanical AVR placed in 2004.  Echocardiogram pending.  Currently on IV heparin in the event invasive procedures are required.  4.  Hypertension: Controlled on present therapy.  No changes.  5.  Hyperlipidemia: Continue atorvastatin.  He is on Repatha at home.  6.  PAD: Status post lower extremity bypass surgery and left iliac stenting.  Continue aspirin and statin.  7.  History of GI bleed: Hospitalized for this in late September.  He underwent clipping for solitary ulcers in the cecum and proximal colon.  Hemoglobin 9.4 today, 10.2 yesterday.    For questions or updates, please contact [place] Please consult [email or web address] for contact info under Cardiology/STEMI.      Signed, Kate Sable, MD  09/17/2019, 10:55 AM

## 2019-09-18 ENCOUNTER — Encounter (HOSPITAL_COMMUNITY)
Admission: EM | Disposition: A | Discharge status: Home / Self Care | Payer: Self-pay | Source: Home / Self Care | Attending: Cardiovascular Disease

## 2019-09-18 DIAGNOSIS — I5033 Acute on chronic diastolic (congestive) heart failure: Secondary | ICD-10-CM

## 2019-09-18 DIAGNOSIS — I2510 Atherosclerotic heart disease of native coronary artery without angina pectoris: Secondary | ICD-10-CM

## 2019-09-18 DIAGNOSIS — I200 Unstable angina: Secondary | ICD-10-CM

## 2019-09-18 HISTORY — PX: CORONARY/GRAFT ANGIOGRAPHY: CATH118237

## 2019-09-18 LAB — PROTIME-INR
INR: 1.5 — ABNORMAL HIGH (ref 0.8–1.2)
Prothrombin Time: 17.8 seconds — ABNORMAL HIGH (ref 11.4–15.2)

## 2019-09-18 LAB — CBC
HCT: 28.9 % — ABNORMAL LOW (ref 39.0–52.0)
Hemoglobin: 9.4 g/dL — ABNORMAL LOW (ref 13.0–17.0)
MCH: 27.9 pg (ref 26.0–34.0)
MCHC: 32.5 g/dL (ref 30.0–36.0)
MCV: 85.8 fL (ref 80.0–100.0)
Platelets: 401 10*3/uL — ABNORMAL HIGH (ref 150–400)
RBC: 3.37 MIL/uL — ABNORMAL LOW (ref 4.22–5.81)
RDW: 19 % — ABNORMAL HIGH (ref 11.5–15.5)
WBC: 6.9 10*3/uL (ref 4.0–10.5)
nRBC: 1.9 % — ABNORMAL HIGH (ref 0.0–0.2)

## 2019-09-18 LAB — HEPARIN LEVEL (UNFRACTIONATED): Heparin Unfractionated: 0.55 IU/mL (ref 0.30–0.70)

## 2019-09-18 LAB — GLUCOSE, CAPILLARY: Glucose-Capillary: 118 mg/dL — ABNORMAL HIGH (ref 70–99)

## 2019-09-18 LAB — POCT ACTIVATED CLOTTING TIME: Activated Clotting Time: 136 seconds

## 2019-09-18 SURGERY — CORONARY/GRAFT ANGIOGRAPHY
Anesthesia: LOCAL

## 2019-09-18 MED ORDER — LABETALOL HCL 5 MG/ML IV SOLN: 10.0000 mg | INTRAVENOUS | Status: AC | PRN

## 2019-09-18 MED ORDER — IOHEXOL 350 MG/ML SOLN
INTRAVENOUS | Status: DC | PRN
  Administered 2019-09-18: 80 mL via INTRA_ARTERIAL

## 2019-09-18 MED ORDER — SODIUM CHLORIDE 0.9 % IV SOLN: 250.0000 mL | INTRAVENOUS | Status: DC | PRN

## 2019-09-18 MED ORDER — HEPARIN (PORCINE) IN NACL 1000-0.9 UT/500ML-% IV SOLN
INTRAVENOUS | Status: DC | PRN
  Administered 2019-09-18 (×2): 500 mL

## 2019-09-18 MED ORDER — SODIUM CHLORIDE 0.9% FLUSH: 3.0000 mL | Freq: Two times a day (BID) | INTRAVENOUS | Status: DC

## 2019-09-18 MED ORDER — HEPARIN (PORCINE) IN NACL 1000-0.9 UT/500ML-% IV SOLN
INTRAVENOUS | Status: AC
  Filled 2019-09-18: qty 1000

## 2019-09-18 MED ORDER — SODIUM CHLORIDE 0.9% FLUSH: 3.0000 mL | INTRAVENOUS | Status: DC | PRN

## 2019-09-18 MED ORDER — SODIUM CHLORIDE 0.9 % WEIGHT BASED INFUSION: 3.0000 mL/kg/h | INTRAVENOUS | Status: DC

## 2019-09-18 MED ORDER — MORPHINE SULFATE (PF) 2 MG/ML IV SOLN: 2.0000 mg | INTRAVENOUS | Status: DC | PRN

## 2019-09-18 MED ORDER — WARFARIN - PHARMACIST DOSING INPATIENT
Freq: Every day | Status: DC
  Administered 2019-09-18: 17:00:00

## 2019-09-18 MED ORDER — SODIUM CHLORIDE 0.9 % IV SOLN
INTRAVENOUS | Status: DC
  Administered 2019-09-18: 21:00:00 via INTRAVENOUS

## 2019-09-18 MED ORDER — SODIUM CHLORIDE 0.9% FLUSH
3.0000 mL | Freq: Two times a day (BID) | INTRAVENOUS | Status: DC
  Administered 2019-09-18 – 2019-09-19 (×2): 3 mL via INTRAVENOUS

## 2019-09-18 MED ORDER — FENTANYL CITRATE (PF) 100 MCG/2ML IJ SOLN
INTRAMUSCULAR | Status: AC
  Filled 2019-09-18: qty 2

## 2019-09-18 MED ORDER — ASPIRIN 81 MG PO CHEW
81.0000 mg | CHEWABLE_TABLET | ORAL | Status: AC
  Administered 2019-09-18: 81 mg via ORAL
  Filled 2019-09-18: qty 1

## 2019-09-18 MED ORDER — HEPARIN (PORCINE) 25000 UT/250ML-% IV SOLN
1550.0000 [IU]/h | INTRAVENOUS | Status: DC
  Administered 2019-09-18: 1550 [IU]/h via INTRAVENOUS
  Filled 2019-09-18: qty 250

## 2019-09-18 MED ORDER — SODIUM CHLORIDE 0.9 % WEIGHT BASED INFUSION
1.0000 mL/kg/h | INTRAVENOUS | Status: DC
  Administered 2019-09-18: 1 mL/kg/h via INTRAVENOUS

## 2019-09-18 MED ORDER — MIDAZOLAM HCL 2 MG/2ML IJ SOLN
INTRAMUSCULAR | Status: DC | PRN
  Administered 2019-09-18: 1 mg via INTRAVENOUS

## 2019-09-18 MED ORDER — HYDRALAZINE HCL 20 MG/ML IJ SOLN: 10.0000 mg | INTRAMUSCULAR | Status: AC | PRN

## 2019-09-18 MED ORDER — ONDANSETRON HCL 4 MG/2ML IJ SOLN: 4.0000 mg | Freq: Four times a day (QID) | INTRAMUSCULAR | Status: DC | PRN

## 2019-09-18 MED ORDER — FENTANYL CITRATE (PF) 100 MCG/2ML IJ SOLN
INTRAMUSCULAR | Status: DC | PRN
  Administered 2019-09-18: 25 ug via INTRAVENOUS

## 2019-09-18 MED ORDER — ACETAMINOPHEN 325 MG PO TABS: 650.0000 mg | ORAL_TABLET | ORAL | Status: DC | PRN

## 2019-09-18 MED ORDER — LIDOCAINE HCL (PF) 1 % IJ SOLN
INTRAMUSCULAR | Status: DC | PRN
  Administered 2019-09-18: 30 mL

## 2019-09-18 MED ORDER — MIDAZOLAM HCL 2 MG/2ML IJ SOLN
INTRAMUSCULAR | Status: AC
  Filled 2019-09-18: qty 2

## 2019-09-18 MED ORDER — LIDOCAINE HCL (PF) 1 % IJ SOLN
INTRAMUSCULAR | Status: AC
  Filled 2019-09-18: qty 30

## 2019-09-18 MED ORDER — WARFARIN SODIUM 10 MG PO TABS
10.0000 mg | ORAL_TABLET | Freq: Once | ORAL | Status: AC
  Administered 2019-09-18: 17:00:00 10 mg via ORAL
  Filled 2019-09-18: qty 1

## 2019-09-18 SURGICAL SUPPLY — 7 items
CATH DXT MULTI JL4 JR4 ANG PIG (CATHETERS) ×1 IMPLANT
KIT HEART LEFT (KITS) ×2 IMPLANT
PACK CARDIAC CATHETERIZATION (CUSTOM PROCEDURE TRAY) ×2 IMPLANT
SHEATH PINNACLE 5F 10CM (SHEATH) ×1 IMPLANT
TRANSDUCER W/STOPCOCK (MISCELLANEOUS) ×2 IMPLANT
WIRE EMERALD 3MM-J .035X150CM (WIRE) ×1 IMPLANT
WIRE HI TORQ VERSACORE-J 145CM (WIRE) ×1 IMPLANT

## 2019-09-18 NOTE — Progress Notes (Signed)
Patient currently has a RT upper FA IV close to the RAC, IV seems to be working properly with no difficulties. Patient does not want to be stuck anymore at this time. RN Rona Ravens made aware

## 2019-09-18 NOTE — Progress Notes (Signed)
ANTICOAGULATION CONSULT NOTE - Follow Up Consult  Pharmacy Consult for Heparin Indication: chest pain/ACS with h/o mechanical valve  No Known Allergies  Patient Measurements: Height: 5\' 8"  (172.7 cm) Weight: 168 lb 1.6 oz (76.2 kg)(scale c) IBW/kg (Calculated) : 68.4 Heparin Dosing Weight:    Vital Signs: Temp: 97.3 F (36.3 C) (10/12 0857) Temp Source: Oral (10/12 0857) BP: 100/44 (10/12 0857) Pulse Rate: 75 (10/12 0857)  Labs: Recent Labs    09/16/19 1008 09/16/19 1221 09/16/19 1646 09/16/19 1849  09/17/19 0613 09/17/19 1608 09/17/19 2307 09/18/19 0639  HGB 10.2*  --   --   --   --  9.4*  --   --  9.4*  HCT 32.7*  --   --   --   --  30.3*  --   --  28.9*  PLT 448*  --   --   --   --  404*  --   --  401*  LABPROT 21.3*  --   --   --   --   --   --   --  17.8*  INR 1.9*  --   --   --   --   --   --   --  1.5*  HEPARINUNFRC  --   --   --   --    < > 0.19* 0.53 0.61 0.55  CREATININE 0.93  --   --   --   --  0.94  --   --   --   TROPONINIHS 242* 225* 265* 232*  --   --   --   --   --    < > = values in this interval not displayed.    Estimated Creatinine Clearance: 66.7 mL/min (by C-G formula based on SCr of 0.94 mg/dL).  Assessment:  Anticoag: Coumadin PTA for mech AVR >> Heparin for ACS, rectal bleed 9/27, INR 1.9 (10/10) - HL 0.55 in goal. Hgb 9.4 stable, Plts 401 stable.  Goal of Therapy:  Heparin level 0.3-0.7 units/ml Monitor platelets by anticoagulation protocol: Yes   Plan:  Cont heparin at 1550 units/hr Daily heparin level and CBC Cath 10/12  Neng Albee S. Alford Highland, PharmD, BCPS Clinical Staff Pharmacist Eilene Ghazi Stillinger 09/18/2019,9:06 AM

## 2019-09-18 NOTE — Progress Notes (Signed)
[place] NOTE  Pharmacy Consult;  Heparin Indication:  History of mechanical AVR + ACS  No Known Allergies  Patient Measurements: Height: 5\' 8"  (172.7 cm) Weight: 168 lb 3.2 oz (76.3 kg) IBW/kg (Calculated) : 68.4 Heparin Dosing Weight: 75 kg  Vital Signs: Temp: 98.1 F (36.7 C) (10/11 1955) Temp Source: Oral (10/11 1955) BP: 115/50 (10/11 1955) Pulse Rate: 80 (10/11 1955)  Labs: Recent Labs    09/16/19 1008 09/16/19 1221 09/16/19 1646 09/16/19 1849  09/17/19 0613 09/17/19 1608 09/17/19 2307  HGB 10.2*  --   --   --   --  9.4*  --   --   HCT 32.7*  --   --   --   --  30.3*  --   --   PLT 448*  --   --   --   --  404*  --   --   LABPROT 21.3*  --   --   --   --   --   --   --   INR 1.9*  --   --   --   --   --   --   --   HEPARINUNFRC  --   --   --   --    < > 0.19* 0.53 0.61  CREATININE 0.93  --   --   --   --  0.94  --   --   TROPONINIHS 242* 225* 265* 232*  --   --   --   --    < > = values in this interval not displayed.    Estimated Creatinine Clearance: 66.7 mL/min (by C-G formula based on SCr of 0.94 mg/dL).   Medical History: Past Medical History:  Diagnosis Date  . Adenomatous colon polyp 09/1997  . Anemia   . Aortic stenosis    s/p st. jude mechanical AVR - Chronic Coumadin  . Blood transfusion    "related to ITP"  . Coronary artery disease    s/p cabg x 3 11/2003: lima-lad, seq vg to rpda and rpl  . Diverticulitis of colon   . Heart murmur   . Hyperlipidemia   . Hypertension   . ITP (idiopathic thrombocytopenic purpura)   . Peripheral arterial disease ([place])    a. history of aortobifemoral bypass grafting by Dr. Sherren Mocha early b. LE angiography 04/22/2015 patent aortobifem graft, DES to R SFA  . Peripheral vascular disease ([place])    s/p Left external Iliac Artery stenting and subsequent left femoral endarterectomy 02/2011- post op course complicated by wound infxn req I&D 03/2011  . Renal artery stenosis, native, bilateral (HCC)    a.  bilateral renal artery stenosis by recent duplex ultrasound b. L renal artery stent 02/2015, R renal artery patent on angiogram  . Stroke ([place])   . TIA (transient ischemic attack) ~ 2013  . Type II diabetes mellitus (HCC)    Assessment: 39 YOM with history of mechanical AVR and CVA on Coumadin PTA.  He presented to the ED with chest pain and Pharmacy consulted to transition anticoagulation to IV heparin.  INR is slightly sub-therapeutic at 1.9 on admit.  Noted patient recently had rectal bleeding and polyps were resected during colonoscopy on 08/30/19.   10/12 AM update: Heparin level therapeutic x 2   Goal of Therapy:  Heparin level 0.3-0.7 units/ml Monitor platelets by anticoagulation protocol: Yes   Plan:  Continue IV heparin at current rate of 1550 units/hr. Daily heparin level and CBC.  Narda Bonds, PharmD, BCPS Clinical Pharmacist Phone: [phone]

## 2019-09-18 NOTE — Progress Notes (Signed)
Site area: right groin  Site Prior to Removal:  Level 0  Pressure Applied For 20 MINUTES    Minutes Beginning [phone]    Manual:   Yes.    Patient Status During Pull:  Stable   Post Pull Groin Site:  Level 0  Post Pull Instructions Given:  Yes.    Post Pull Pulses Present:  Yes.    Dressing Applied:  Yes.    Comments:  Sheath was pulled by Fredirick Maudlin. Bed rest started at 1630 x 4 hr.

## 2019-09-18 NOTE — Progress Notes (Signed)
ANTICOAGULATION CONSULT NOTE - Follow Up Consult  Pharmacy Consult for heparin and warfarin Indication: h/o mechanical valve  No Known Allergies  Patient Measurements: Height: 5\' 8"  (172.7 cm) Weight: 168 lb 1.6 oz (76.2 kg)(scale c) IBW/kg (Calculated) : 68.4 Heparin Dosing Weight:  75 kg  Vital Signs: Temp: 98.4 F (36.9 C) (10/12 1149) Temp Source: Oral (10/12 1149) BP: 141/38 (10/12 1604) Pulse Rate: 73 (10/12 1604)  Labs: Recent Labs    09/16/19 1008 09/16/19 1221 09/16/19 1646 09/16/19 1849  09/17/19 0613 09/17/19 1608 09/17/19 2307 09/18/19 0639  HGB 10.2*  --   --   --   --  9.4*  --   --  9.4*  HCT 32.7*  --   --   --   --  30.3*  --   --  28.9*  PLT 448*  --   --   --   --  404*  --   --  401*  LABPROT 21.3*  --   --   --   --   --   --   --  17.8*  INR 1.9*  --   --   --   --   --   --   --  1.5*  HEPARINUNFRC  --   --   --   --    < > 0.19* 0.53 0.61 0.55  CREATININE 0.93  --   --   --   --  0.94  --   --   --   TROPONINIHS 242* 225* 265* 232*  --   --   --   --   --    < > = values in this interval not displayed.    Estimated Creatinine Clearance: 66.7 mL/min (by C-G formula based on SCr of 0.94 mg/dL).  Assessment:  73 yo M on warfarin PTA for hx mechanical AVR.  Warfarin was held and pt was bridged with heparin for cardiac cath (completed on 10/12).  Patient was previously therapeutic on heparin at 1550 units/hr.  To resume heparin 4 hours after sheath pull.  Also to restart warfarin dosing tonight.  PTA warfarin dose 5mg  daily except 10mg  on TTSun  Goal of Therapy:  Heparin level 0.3-0.7 units/ml  INR goal 2.5-3.5 Monitor platelets by anticoagulation protocol: Yes   Plan:  Restart heparin at 1550 units/hr at 2030 tonight Warfarin 10mg  PO x 1 tonight Daily heparin level, CBC, and INR  [employer], Pharm.D., BCPS Clinical Pharmacist  **Pharmacist phone directory can now be found on [email or web address] ([email or web address]).  Listed under [place].  09/18/2019 4:32 PM

## 2019-09-18 NOTE — Progress Notes (Signed)
Progress Note  Patient Name: [name] [name] Date of Encounter: 09/18/2019  Primary Cardiologist: Jenkins Rouge, MD   Subjective   No new chest tightness today. No CV complaints.  Inpatient Medications    Scheduled Meds:  acidophilus  1 capsule Oral Daily   amLODipine  10 mg Oral Daily   aspirin EC  81 mg Oral Daily   atorvastatin  80 mg Oral Daily   canagliflozin  100 mg Oral Daily   furosemide  20 mg Oral Daily   metoprolol succinate  50 mg Oral Daily   sodium chloride flush  3 mL Intravenous Q12H   trandolapril  4 mg Oral Daily   Continuous Infusions:  sodium chloride     sodium chloride 1 mL/kg/hr (09/18/19 0747)   heparin 1,550 Units/hr (09/17/19 2207)   PRN Meds: sodium chloride, acetaminophen, nitroGLYCERIN, ondansetron (ZOFRAN) IV, sodium chloride flush   Vital Signs    Vitals:   09/18/19 0042 09/18/19 0449 09/18/19 0801 09/18/19 0857  BP: (!) 107/37 (!) 109/55 (!) 104/31 (!) 100/44  Pulse: 71 79 77 75  Resp: 18 20 16 18   Temp: 98.5 F (36.9 C) 98.7 F (37.1 C) 98.9 F (37.2 C) (!) 97.3 F (36.3 C)  TempSrc: Oral Oral Oral Oral  SpO2: 94% 94% 100% 95%  Weight:  76.2 kg    Height:        Intake/Output Summary (Last 24 hours) at 09/18/2019 1021 Last data filed at 09/18/2019 0900 Gross per 24 hour  Intake 1113 ml  Output 1150 ml  Net -37 ml   Last 3 Weights 09/18/2019 09/17/2019 09/16/2019  Weight (lbs) 168 lb 1.6 oz 168 lb 3.2 oz 166 lb 0.1 oz  Weight (kg) 76.25 kg 76.295 kg 75.3 kg      Telemetry    NSR w frequent monomorphic PVCs - Personally Reviewed  ECG    NSR, QS V1-V2, lateral ST depression and T wave inversion (chronic) - Personally Reviewed  Physical Exam  Appears well GEN: No acute distress.   Neck: No JVD Cardiac: RRR, crisp prosthetic valve clicks, no murmurs, rubs, or gallops.  Respiratory: Clear to auscultation bilaterally. GI: Soft, nontender, non-distended  MS: No edema; No deformity. Neuro:   Nonfocal  Psych: Normal affect   Labs    High Sensitivity Troponin:   Recent Labs  Lab 09/16/19 1008 09/16/19 1221 09/16/19 1646 09/16/19 1849  TROPONINIHS 242* 225* 265* 232*      Chemistry Recent Labs  Lab 09/16/19 1008 09/17/19 0613  NA 135 138  K 4.4 3.5  CL 99 99  CO2 24 25  GLUCOSE 214* 115*  BUN 13 13  CREATININE 0.93 0.94  CALCIUM 9.1 8.6*  PROT 7.0  --   ALBUMIN 3.7  --   AST 31  --   ALT 29  --   ALKPHOS 61  --   BILITOT 0.8  --   GFRNONAA >60 >60  GFRAA >60 >60  ANIONGAP 12 14     Hematology Recent Labs  Lab 09/16/19 1008 09/17/19 0613 09/18/19 0639  WBC 10.2 7.8 6.9  RBC 3.66* 3.47* 3.37*  HGB 10.2* 9.4* 9.4*  HCT 32.7* 30.3* 28.9*  MCV 89.3 87.3 85.8  MCH 27.9 27.1 27.9  MCHC 31.2 31.0 32.5  RDW 19.2* 19.0* 19.0*  PLT 448* 404* 401*    BNP Recent Labs  Lab 09/16/19 1008  BNP 676.4*     DDimer No results for input(s): DDIMER in the last 168 hours.  Radiology    Dg Chest 2 View  Result Date: 09/16/2019 CLINICAL DATA:  Short of breath. Chest pain for 2 weeks. EXAM: CHEST - 2 VIEW COMPARISON:  02/23/2019 FINDINGS: Numerous leads and wires project over the chest. Midline trachea. Mild cardiomegaly. Atherosclerosis in the transverse aorta. Prior median sternotomy. No pleural effusion or pneumothorax. Hyperinflation mild interstitial thickening. No lobar consolidation. IMPRESSION: No acute cardiopulmonary disease. Hyperinflation interstitial thickening, likely due to smoking/chronic bronchitis. Electronically Signed   By: Abigail Miyamoto M.D.   On: 09/16/2019 11:11   Ct Angio Chest Pe W/cm &/or Wo Cm  Result Date: 09/16/2019 CLINICAL DATA:  Chest pain with tightness and burning sensations as well as palpitations for the past 4-5 days. EXAM: CT ANGIOGRAPHY CHEST WITH CONTRAST TECHNIQUE: Multidetector CT imaging of the chest was performed using the standard protocol during bolus administration of intravenous contrast. Multiplanar CT image  reconstructions and MIPs were obtained to evaluate the vascular anatomy. CONTRAST:  2mL OMNIPAQUE IOHEXOL 350 MG/ML SOLN COMPARISON:  Chest radiographs obtained earlier today. FINDINGS: Cardiovascular: Post CABG changes. Dense atheromatous arterial calcifications, including the aorta and coronary arteries. Prosthetic aortic valve. Normally opacified pulmonary arteries with no pulmonary arterial filling defects seen. Mediastinum/Nodes: No enlarged mediastinal, hilar, or axillary lymph nodes. Thyroid gland, trachea, and esophagus demonstrate no significant findings. Lungs/Pleura: Small right pleural effusion and minimal left pleural effusion. Minimal patchy interstitial prominence in both lungs with minimal centrilobular bullous changes. Upper Abdomen: Surgically absent spleen. Mildly nodular liver contours with a prominent caudate lobe and prominent lateral segment left lobe. Dense atheromatous arterial calcifications, including both proximal renal arteries an the superior mesenteric artery. Musculoskeletal: Minimal thoracic spine degenerative changes. Review of the MIP images confirms the above findings. IMPRESSION: 1. No pulmonary emboli. 2. Small right pleural effusion and minimal left pleural effusion. 3. Minimal patchy interstitial prominence in both lungs, compatible with minimal interstitial pulmonary edema. 4. Minimal changes of COPD and chronic bronchitis. 5. Post CABG changes with dense calcific coronary artery and aortic atherosclerosis. Aortic Atherosclerosis (ICD10-I70.0) and Emphysema (ICD10-J43.9). Electronically Signed   By: Claudie Revering M.D.   On: 09/16/2019 12:10   Nm Myocar Multi W/spect W/wall Motion / Ef  Result Date: 09/17/2019 CLINICAL DATA:  73 year old male with history of CAD and CABG. Chest pain and palpitations. EXAM: MYOCARDIAL IMAGING WITH SPECT (REST AND PHARMACOLOGIC-STRESS) GATED LEFT VENTRICULAR WALL MOTION STUDY LEFT VENTRICULAR EJECTION FRACTION TECHNIQUE: Standard myocardial  SPECT imaging was performed after resting intravenous injection of 10 mCi Tc-69m tetrofosmin. Subsequently, intravenous infusion of Lexiscan was performed under the supervision of the Cardiology staff. At peak effect of the drug, 30 mCi Tc-98m tetrofosmin was injected intravenously and standard myocardial SPECT imaging was performed. Quantitative gated imaging was also performed to evaluate left ventricular wall motion, and estimate left ventricular ejection fraction. COMPARISON:  None. FINDINGS: Perfusion: Moderate to large region of decreased perfusion in the apical and mid segment of the lateral wall. This defect is present on rest and stress however slightly larger on stress suggesting a rim of peri-infarct ischemia. Second moderate size defect in the mid and basilar segment of the septal wall which is fixed on rest and stress. Wall Motion: LEFT ventricular dilatation which is similar rest and stress. Mild septal hypokinesia/dyskinesia. Left Ventricular Ejection Fraction: 35 % End diastolic volume 767 ml End systolic volume 341 ml IMPRESSION: 1. Moderate size infarction in the mid lateral wall with small rim of peri-infarct ischemia. Second moderate-size infarction in the basilar segment of the septal wall.  2. LEFT ventricular dilatation and septal hypokinesia/dyskinesia. 3. Left ventricular ejection fraction 35% 4. Non invasive risk stratification*: High (risk stratification primarily based on low ejection fraction) *2012 Appropriate Use Criteria for Coronary Revascularization Focused Update: J Am Coll Cardiol. 0102;72(5):366-440. http://content.[email or web address].aspx?articleid=[email or web address] Electronically Signed   By: Suzy Bouchard M.D.   On: 09/17/2019 11:33    Cardiac Studies   ECHO 09/17/2019:  1. Left ventricular ejection fraction, by visual estimation, is 50 to 55%. The left ventricle has normal function. Normal left ventricular size. There is mildly increased left ventricular hypertrophy.  2.  Left ventricular diastolic Doppler parameters are consistent with pseudonormalization pattern of LV diastolic filling.  3. Global right ventricle has normal systolic function.The right ventricular size is normal.  4. Left atrial size was normal.  5. Right atrial size was normal.  6. Mild mitral annular calcification.  7. The mitral valve is normal in structure. Trace mitral valve regurgitation. No evidence of mitral stenosis.  8. The tricuspid valve is normal in structure. Tricuspid valve regurgitation is trivial.  9. Aortic valve regurgitation was not visualized by color flow Doppler. 10. The pulmonic valve was normal in structure. Pulmonic valve regurgitation is not visualized by color flow Doppler. 11. The inferior vena cava is normal in size with greater than 50% respiratory variability, suggesting right atrial pressure of 3 mmHg. 12. Septal hypokinesis with overall preserved LV function; grade 2 diastolic dysfunction; mild LVH; s/p AVR with mean gradient 13 mmHg and no AI.  Nuclear study 09/17/2019 FINDINGS: Perfusion: Moderate to large region of decreased perfusion in the apical and mid segment of the lateral wall. This defect is present on rest and stress however slightly larger on stress suggesting a rim of peri-infarct ischemia.  Second moderate size defect in the mid and basilar segment of the septal wall which is fixed on rest and stress.  Wall Motion: LEFT ventricular dilatation which is similar rest and stress. Mild septal hypokinesia/dyskinesia.  Left Ventricular Ejection Fraction: 35 %  End diastolic volume 347 ml  End systolic volume 425 ml  IMPRESSION: 1. Moderate size infarction in the mid lateral wall with small rim of peri-infarct ischemia.  Second moderate-size infarction in the basilar segment of the septal wall.  2. LEFT ventricular dilatation and septal hypokinesia/dyskinesia.  3. Left ventricular ejection fraction 35%  4. Non invasive risk  stratification*: High (risk stratification primarily based on low ejection fraction)  Patient Profile     72 y.o. male with hx of CAD status post CABG withmechanicalAVR in 2004,on coumadin, DM, HTN, HLD, ITP, PAD s/p aobifem bypass, L iliac stent,TIA and recurrent GI bleed seen for chest pain.  Assessment & Plan    1.  Acute diastolic HF: resolved, now on oral diuretics. EF 50-55% by echo (more reliable than nuclear QGS, especially w frequent PVCs).  2.  CAD s/p CABG:  Unstable angina; "plateau" troponin elevation; cardiac catheterization today. Clopidogrel preferred if he needs PCI.  3.  Mechanical AVR placed in 2004. Normal function on echo. Will need heparin to warfarin transition.  4.  Hypertension: Controlled.  5.  Hyperlipidemia: Continue atorvastatin.  He is on Repatha at home.  6.  PAD: Status post lower extremity bypass surgery and left iliac stenting.  Continue aspirin and statin. Knows Dr. Gwenlyn Found from [facility] procedures and prefers him for coronary cath.  7.  History of GI bleed: Hospitalized for this in late September.  He underwent clipping for solitary ulcers in the cecum and proximal colon.  Hemoglobin 9.4 today,  10.2 yesterday.  For questions or updates, please contact [place] Please consult [email or web address] for contact info under        Signed, Sanda Klein, MD  09/18/2019, 10:21 AM

## 2019-09-18 NOTE — Plan of Care (Signed)
  Problem: Clinical Measurements: Goal: Respiratory complications will improve Outcome: Completed/Met   Problem: Nutrition: Goal: Adequate nutrition will be maintained Outcome: Completed/Met   Problem: Elimination: Goal: Will not experience complications related to bowel motility Outcome: Completed/Met   Problem: Pain Managment: Goal: General experience of comfort will improve Outcome: Completed/Met   Problem: Safety: Goal: Ability to remain free from injury will improve Outcome: Completed/Met   Problem: Skin Integrity: Goal: Risk for impaired skin integrity will decrease Outcome: Completed/Met

## 2019-09-18 NOTE — H&P (View-Only) (Signed)
Progress Note  Patient Name: [name] [name] Date of Encounter: 09/18/2019  Primary Cardiologist: Jenkins Rouge, MD   Subjective   No new chest tightness today. No CV complaints.  Inpatient Medications    Scheduled Meds:  acidophilus  1 capsule Oral Daily   amLODipine  10 mg Oral Daily   aspirin EC  81 mg Oral Daily   atorvastatin  80 mg Oral Daily   canagliflozin  100 mg Oral Daily   furosemide  20 mg Oral Daily   metoprolol succinate  50 mg Oral Daily   sodium chloride flush  3 mL Intravenous Q12H   trandolapril  4 mg Oral Daily   Continuous Infusions:  sodium chloride     sodium chloride 1 mL/kg/hr (09/18/19 0747)   heparin 1,550 Units/hr (09/17/19 2207)   PRN Meds: sodium chloride, acetaminophen, nitroGLYCERIN, ondansetron (ZOFRAN) IV, sodium chloride flush   Vital Signs    Vitals:   09/18/19 0042 09/18/19 0449 09/18/19 0801 09/18/19 0857  BP: (!) 107/37 (!) 109/55 (!) 104/31 (!) 100/44  Pulse: 71 79 77 75  Resp: 18 20 16 18   Temp: 98.5 F (36.9 C) 98.7 F (37.1 C) 98.9 F (37.2 C) (!) 97.3 F (36.3 C)  TempSrc: Oral Oral Oral Oral  SpO2: 94% 94% 100% 95%  Weight:  76.2 kg    Height:        Intake/Output Summary (Last 24 hours) at 09/18/2019 1021 Last data filed at 09/18/2019 0900 Gross per 24 hour  Intake 1113 ml  Output 1150 ml  Net -37 ml   Last 3 Weights 09/18/2019 09/17/2019 09/16/2019  Weight (lbs) 168 lb 1.6 oz 168 lb 3.2 oz 166 lb 0.1 oz  Weight (kg) 76.25 kg 76.295 kg 75.3 kg      Telemetry    NSR w frequent monomorphic PVCs - Personally Reviewed  ECG    NSR, QS V1-V2, lateral ST depression and T wave inversion (chronic) - Personally Reviewed  Physical Exam  Appears well GEN: No acute distress.   Neck: No JVD Cardiac: RRR, crisp prosthetic valve clicks, no murmurs, rubs, or gallops.  Respiratory: Clear to auscultation bilaterally. GI: Soft, nontender, non-distended  MS: No edema; No deformity. Neuro:   Nonfocal  Psych: Normal affect   Labs    High Sensitivity Troponin:   Recent Labs  Lab 09/16/19 1008 09/16/19 1221 09/16/19 1646 09/16/19 1849  TROPONINIHS 242* 225* 265* 232*      Chemistry Recent Labs  Lab 09/16/19 1008 09/17/19 0613  NA 135 138  K 4.4 3.5  CL 99 99  CO2 24 25  GLUCOSE 214* 115*  BUN 13 13  CREATININE 0.93 0.94  CALCIUM 9.1 8.6*  PROT 7.0  --   ALBUMIN 3.7  --   AST 31  --   ALT 29  --   ALKPHOS 61  --   BILITOT 0.8  --   GFRNONAA >60 >60  GFRAA >60 >60  ANIONGAP 12 14     Hematology Recent Labs  Lab 09/16/19 1008 09/17/19 0613 09/18/19 0639  WBC 10.2 7.8 6.9  RBC 3.66* 3.47* 3.37*  HGB 10.2* 9.4* 9.4*  HCT 32.7* 30.3* 28.9*  MCV 89.3 87.3 85.8  MCH 27.9 27.1 27.9  MCHC 31.2 31.0 32.5  RDW 19.2* 19.0* 19.0*  PLT 448* 404* 401*    BNP Recent Labs  Lab 09/16/19 1008  BNP 676.4*     DDimer No results for input(s): DDIMER in the last 168 hours.  Radiology    Dg Chest 2 View  Result Date: 09/16/2019 CLINICAL DATA:  Short of breath. Chest pain for 2 weeks. EXAM: CHEST - 2 VIEW COMPARISON:  02/23/2019 FINDINGS: Numerous leads and wires project over the chest. Midline trachea. Mild cardiomegaly. Atherosclerosis in the transverse aorta. Prior median sternotomy. No pleural effusion or pneumothorax. Hyperinflation mild interstitial thickening. No lobar consolidation. IMPRESSION: No acute cardiopulmonary disease. Hyperinflation interstitial thickening, likely due to smoking/chronic bronchitis. Electronically Signed   By: Abigail Miyamoto M.D.   On: 09/16/2019 11:11   Ct Angio Chest Pe W/cm &/or Wo Cm  Result Date: 09/16/2019 CLINICAL DATA:  Chest pain with tightness and burning sensations as well as palpitations for the past 4-5 days. EXAM: CT ANGIOGRAPHY CHEST WITH CONTRAST TECHNIQUE: Multidetector CT imaging of the chest was performed using the standard protocol during bolus administration of intravenous contrast. Multiplanar CT image  reconstructions and MIPs were obtained to evaluate the vascular anatomy. CONTRAST:  83mL OMNIPAQUE IOHEXOL 350 MG/ML SOLN COMPARISON:  Chest radiographs obtained earlier today. FINDINGS: Cardiovascular: Post CABG changes. Dense atheromatous arterial calcifications, including the aorta and coronary arteries. Prosthetic aortic valve. Normally opacified pulmonary arteries with no pulmonary arterial filling defects seen. Mediastinum/Nodes: No enlarged mediastinal, hilar, or axillary lymph nodes. Thyroid gland, trachea, and esophagus demonstrate no significant findings. Lungs/Pleura: Small right pleural effusion and minimal left pleural effusion. Minimal patchy interstitial prominence in both lungs with minimal centrilobular bullous changes. Upper Abdomen: Surgically absent spleen. Mildly nodular liver contours with a prominent caudate lobe and prominent lateral segment left lobe. Dense atheromatous arterial calcifications, including both proximal renal arteries an the superior mesenteric artery. Musculoskeletal: Minimal thoracic spine degenerative changes. Review of the MIP images confirms the above findings. IMPRESSION: 1. No pulmonary emboli. 2. Small right pleural effusion and minimal left pleural effusion. 3. Minimal patchy interstitial prominence in both lungs, compatible with minimal interstitial pulmonary edema. 4. Minimal changes of COPD and chronic bronchitis. 5. Post CABG changes with dense calcific coronary artery and aortic atherosclerosis. Aortic Atherosclerosis (ICD10-I70.0) and Emphysema (ICD10-J43.9). Electronically Signed   By: Claudie Revering M.D.   On: 09/16/2019 12:10   Nm Myocar Multi W/spect W/wall Motion / Ef  Result Date: 09/17/2019 CLINICAL DATA:  73 year old male with history of CAD and CABG. Chest pain and palpitations. EXAM: MYOCARDIAL IMAGING WITH SPECT (REST AND PHARMACOLOGIC-STRESS) GATED LEFT VENTRICULAR WALL MOTION STUDY LEFT VENTRICULAR EJECTION FRACTION TECHNIQUE: Standard myocardial  SPECT imaging was performed after resting intravenous injection of 10 mCi Tc-60m tetrofosmin. Subsequently, intravenous infusion of Lexiscan was performed under the supervision of the Cardiology staff. At peak effect of the drug, 30 mCi Tc-77m tetrofosmin was injected intravenously and standard myocardial SPECT imaging was performed. Quantitative gated imaging was also performed to evaluate left ventricular wall motion, and estimate left ventricular ejection fraction. COMPARISON:  None. FINDINGS: Perfusion: Moderate to large region of decreased perfusion in the apical and mid segment of the lateral wall. This defect is present on rest and stress however slightly larger on stress suggesting a rim of peri-infarct ischemia. Second moderate size defect in the mid and basilar segment of the septal wall which is fixed on rest and stress. Wall Motion: LEFT ventricular dilatation which is similar rest and stress. Mild septal hypokinesia/dyskinesia. Left Ventricular Ejection Fraction: 35 % End diastolic volume 716 ml End systolic volume 967 ml IMPRESSION: 1. Moderate size infarction in the mid lateral wall with small rim of peri-infarct ischemia. Second moderate-size infarction in the basilar segment of the septal wall.  2. LEFT ventricular dilatation and septal hypokinesia/dyskinesia. 3. Left ventricular ejection fraction 35% 4. Non invasive risk stratification*: High (risk stratification primarily based on low ejection fraction) *2012 Appropriate Use Criteria for Coronary Revascularization Focused Update: J Am Coll Cardiol. 6378;58(8):502-774. http://content.airportbarriers.com.aspx?articleid=[email or web address] Electronically Signed   By: Suzy Bouchard M.D.   On: 09/17/2019 11:33    Cardiac Studies   ECHO 09/17/2019:  1. Left ventricular ejection fraction, by visual estimation, is 50 to 55%. The left ventricle has normal function. Normal left ventricular size. There is mildly increased left ventricular hypertrophy.  2.  Left ventricular diastolic Doppler parameters are consistent with pseudonormalization pattern of LV diastolic filling.  3. Global right ventricle has normal systolic function.The right ventricular size is normal.  4. Left atrial size was normal.  5. Right atrial size was normal.  6. Mild mitral annular calcification.  7. The mitral valve is normal in structure. Trace mitral valve regurgitation. No evidence of mitral stenosis.  8. The tricuspid valve is normal in structure. Tricuspid valve regurgitation is trivial.  9. Aortic valve regurgitation was not visualized by color flow Doppler. 10. The pulmonic valve was normal in structure. Pulmonic valve regurgitation is not visualized by color flow Doppler. 11. The inferior vena cava is normal in size with greater than 50% respiratory variability, suggesting right atrial pressure of 3 mmHg. 12. Septal hypokinesis with overall preserved LV function; grade 2 diastolic dysfunction; mild LVH; s/p AVR with mean gradient 13 mmHg and no AI.  Nuclear study 09/17/2019 FINDINGS: Perfusion: Moderate to large region of decreased perfusion in the apical and mid segment of the lateral wall. This defect is present on rest and stress however slightly larger on stress suggesting a rim of peri-infarct ischemia.  Second moderate size defect in the mid and basilar segment of the septal wall which is fixed on rest and stress.  Wall Motion: LEFT ventricular dilatation which is similar rest and stress. Mild septal hypokinesia/dyskinesia.  Left Ventricular Ejection Fraction: 35 %  End diastolic volume 128 ml  End systolic volume 786 ml  IMPRESSION: 1. Moderate size infarction in the mid lateral wall with small rim of peri-infarct ischemia.  Second moderate-size infarction in the basilar segment of the septal wall.  2. LEFT ventricular dilatation and septal hypokinesia/dyskinesia.  3. Left ventricular ejection fraction 35%  4. Non invasive risk  stratification*: High (risk stratification primarily based on low ejection fraction)  Patient Profile     73 y.o. male with hx of CAD status post CABG withmechanicalAVR in 2004,on coumadin, DM, HTN, HLD, ITP, PAD s/p aobifem bypass, L iliac stent,TIA and recurrent GI bleed seen for chest pain.  Assessment & Plan    1.  Acute diastolic HF: resolved, now on oral diuretics. EF 50-55% by echo (more reliable than nuclear QGS, especially w frequent PVCs).  2.  CAD s/p CABG:  Unstable angina; "plateau" troponin elevation; cardiac catheterization today. Clopidogrel preferred if he needs PCI.  3.  Mechanical AVR placed in 2004. Normal function on echo. Will need heparin to warfarin transition.  4.  Hypertension: Controlled.  5.  Hyperlipidemia: Continue atorvastatin.  He is on Repatha at home.  6.  PAD: Status post lower extremity bypass surgery and left iliac stenting.  Continue aspirin and statin. Knows Dr. Gwenlyn Found from [facility] procedures and prefers him for coronary cath.  7.  History of GI bleed: Hospitalized for this in late September.  He underwent clipping for solitary ulcers in the cecum and proximal colon.  Hemoglobin 9.4 today,  10.2 yesterday.  For questions or updates, please contact [place] Please consult [email or web address] for contact info under        Signed, Sanda Klein, MD  09/18/2019, 10:21 AM

## 2019-09-18 NOTE — Interval H&P Note (Signed)
Cath Lab Visit (complete for each Cath Lab visit)  Clinical Evaluation Leading to the Procedure:   ACS: Yes.    Non-ACS:    Anginal Classification: CCS III  Anti-ischemic medical therapy: Minimal Therapy (1 class of medications)  Non-Invasive Test Results: No non-invasive testing performed  Prior CABG: Previous CABG      History and Physical Interval Note:  09/18/2019 3:24 PM  [name] [name]  has presented today for surgery, with the diagnosis of NSTEMI.  The various methods of treatment have been discussed with the patient and family. After consideration of risks, benefits and other options for treatment, the patient has consented to  Procedure(s): LEFT HEART CATH AND CORS/GRAFTS ANGIOGRAPHY (N/A) as a surgical intervention.  The patient's history has been reviewed, patient examined, no change in status, stable for surgery.  I have reviewed the patient's chart and labs.  Questions were answered to the patient's satisfaction.     Quay Burow

## 2019-09-19 ENCOUNTER — Encounter (HOSPITAL_COMMUNITY): Payer: Self-pay | Admitting: Cardiovascular Disease

## 2019-09-19 ENCOUNTER — Telehealth: Payer: Self-pay | Admitting: *Deleted

## 2019-09-19 ENCOUNTER — Telehealth: Payer: Self-pay | Admitting: Cardiovascular Disease

## 2019-09-19 DIAGNOSIS — Z8719 Personal history of other diseases of the digestive system: Secondary | ICD-10-CM

## 2019-09-19 DIAGNOSIS — I25110 Atherosclerotic heart disease of native coronary artery with unstable angina pectoris: Principal | ICD-10-CM

## 2019-09-19 DIAGNOSIS — I214 Non-ST elevation (NSTEMI) myocardial infarction: Secondary | ICD-10-CM

## 2019-09-19 DIAGNOSIS — I5031 Acute diastolic (congestive) heart failure: Secondary | ICD-10-CM

## 2019-09-19 LAB — CBC
HCT: 30.5 % — ABNORMAL LOW (ref 39.0–52.0)
Hemoglobin: 9.5 g/dL — ABNORMAL LOW (ref 13.0–17.0)
MCH: 27.3 pg (ref 26.0–34.0)
MCHC: 31.1 g/dL (ref 30.0–36.0)
MCV: 87.6 fL (ref 80.0–100.0)
Platelets: 416 10*3/uL — ABNORMAL HIGH (ref 150–400)
RBC: 3.48 MIL/uL — ABNORMAL LOW (ref 4.22–5.81)
RDW: 19.4 % — ABNORMAL HIGH (ref 11.5–15.5)
WBC: 7.7 10*3/uL (ref 4.0–10.5)
nRBC: 1.4 % — ABNORMAL HIGH (ref 0.0–0.2)

## 2019-09-19 LAB — BASIC METABOLIC PANEL
Anion gap: 12 (ref 5–15)
BUN: 14 mg/dL (ref 8–23)
CO2: 23 mmol/L (ref 22–32)
Calcium: 8.6 mg/dL — ABNORMAL LOW (ref 8.9–10.3)
Chloride: 105 mmol/L (ref 98–111)
Creatinine, Ser: 0.89 mg/dL (ref 0.61–1.24)
GFR calc Af Amer: >60 mL/min (ref >60)
GFR calc non Af Amer: >60 mL/min (ref >60)
Glucose, Bld: 115 mg/dL — ABNORMAL HIGH (ref 70–99)
Potassium: 4 mmol/L (ref 3.5–5.1)
Sodium: 140 mmol/L (ref 135–145)

## 2019-09-19 LAB — PROTIME-INR
INR: 1.3 — ABNORMAL HIGH (ref 0.8–1.2)
Prothrombin Time: 15.9 seconds — ABNORMAL HIGH (ref 11.4–15.2)

## 2019-09-19 LAB — HEPARIN LEVEL (UNFRACTIONATED)
Heparin Unfractionated: 0.36 IU/mL (ref 0.30–0.70)
Heparin Unfractionated: 0.41 IU/mL (ref 0.30–0.70)

## 2019-09-19 MED ORDER — ISOSORBIDE MONONITRATE ER 30 MG PO TB24
30.0000 mg | ORAL_TABLET | Freq: Every day | ORAL | Status: DC
  Administered 2019-09-19: 30 mg via ORAL
  Filled 2019-09-19: qty 1

## 2019-09-19 MED ORDER — ENOXAPARIN SODIUM 80 MG/0.8ML ~~LOC~~ SOLN: 80.0000 mg | Freq: Two times a day (BID) | SUBCUTANEOUS | 1 refills | Status: DC

## 2019-09-19 MED ORDER — METOPROLOL SUCCINATE ER 100 MG PO TB24: 100.0000 mg | ORAL_TABLET | Freq: Every day | ORAL | 3 refills | Status: DC

## 2019-09-19 MED ORDER — ASPIRIN 81 MG PO TBEC: 81.0000 mg | DELAYED_RELEASE_TABLET | Freq: Every day | ORAL | 3 refills | Status: DC

## 2019-09-19 MED ORDER — METOPROLOL SUCCINATE ER 100 MG PO TB24: 100.0000 mg | ORAL_TABLET | Freq: Every day | ORAL | Status: DC

## 2019-09-19 MED ORDER — TRANDOLAPRIL 1 MG PO TABS: 1.0000 mg | ORAL_TABLET | Freq: Every day | ORAL | 3 refills | Status: DC

## 2019-09-19 MED ORDER — METOPROLOL SUCCINATE ER 50 MG PO TB24
50.0000 mg | ORAL_TABLET | Freq: Once | ORAL | Status: AC
  Administered 2019-09-19: 50 mg via ORAL
  Filled 2019-09-19: qty 1

## 2019-09-19 MED ORDER — ENOXAPARIN SODIUM 80 MG/0.8ML ~~LOC~~ SOLN: 80.0000 mg | Freq: Two times a day (BID) | SUBCUTANEOUS | Status: DC

## 2019-09-19 MED ORDER — ISOSORBIDE MONONITRATE ER 30 MG PO TB24: 30.0000 mg | ORAL_TABLET | Freq: Every day | ORAL | 3 refills | Status: DC

## 2019-09-19 MED ORDER — NITROGLYCERIN 0.4 MG SL SUBL: 0.4000 mg | SUBLINGUAL_TABLET | SUBLINGUAL | 12 refills | Status: AC | PRN

## 2019-09-19 MED ORDER — FUROSEMIDE 20 MG PO TABS: 20.0000 mg | ORAL_TABLET | Freq: Every day | ORAL | 3 refills | Status: DC

## 2019-09-19 MED ORDER — WARFARIN SODIUM 10 MG PO TABS: ORAL_TABLET | ORAL | 3 refills | Status: DC

## 2019-09-19 MED ORDER — ENOXAPARIN SODIUM 80 MG/0.8ML ~~LOC~~ SOLN
80.0000 mg | SUBCUTANEOUS | Status: AC
  Administered 2019-09-19: 80 mg via SUBCUTANEOUS
  Filled 2019-09-19: qty 0.8

## 2019-09-19 MED ORDER — TRANDOLAPRIL 1 MG PO TABS: 1.0000 mg | ORAL_TABLET | Freq: Every day | ORAL | Status: DC

## 2019-09-19 MED ORDER — ENOXAPARIN (LOVENOX) PATIENT EDUCATION KIT
PACK | Freq: Once | Status: DC
  Filled 2019-09-19: qty 1

## 2019-09-19 MED ORDER — WARFARIN SODIUM 10 MG PO TABS: 10.0000 mg | ORAL_TABLET | Freq: Once | ORAL | Status: DC

## 2019-09-19 MED FILL — ASPIRIN 81MG ADULT LOW STRE: 81 | 90 days supply | Qty: 90 | Fill #0

## 2019-09-19 MED FILL — FUROSEMIDE 20 MG TAB: 20 | 90 days supply | Qty: 90 | Fill #0

## 2019-09-19 MED FILL — METOPROLOL SUCCINATE ER 100: 100 | 90 days supply | Qty: 90 | Fill #0

## 2019-09-19 MED FILL — TRANDOLAPRIL 1 MG TABLET: 1 | 30 days supply | Qty: 30 | Fill #0

## 2019-09-19 MED FILL — NITROGLYCERIN 0.4 MG TAB SL: 0.4 | 8 days supply | Qty: 25 | Fill #0

## 2019-09-19 MED FILL — ISOSORBIDE MN ER 30 MG TAB: 30 | 90 days supply | Qty: 90 | Fill #0

## 2019-09-19 MED FILL — ENOXAPARIN 80 MG/0.8 ML SYR: 80 | 20 days supply | Qty: 16 | Fill #0

## 2019-09-19 NOTE — Progress Notes (Signed)
[place] for Heparin/warfarin  Indication: h/o mechanical valve  No Known Allergies  Patient Measurements: Height: 5\' 8"  (172.7 cm) Weight: 170 lb 1.6 oz (77.2 kg)(scale c) IBW/kg (Calculated) : 68.4 Heparin Dosing Weight:  75 kg  Vital Signs: Temp: 98.4 F (36.9 C) (10/13 0430) Temp Source: Oral (10/13 0430) BP: 109/47 (10/13 0430) Pulse Rate: 80 (10/13 0430)  Labs: Recent Labs    09/16/19 1008 09/16/19 1221 09/16/19 1646 09/16/19 1849  09/17/19 0613  09/17/19 2307 09/18/19 0639 09/19/19 0436  HGB 10.2*  --   --   --   --  9.4*  --   --  9.4* 9.5*  HCT 32.7*  --   --   --   --  30.3*  --   --  28.9* 30.5*  PLT 448*  --   --   --   --  404*  --   --  401* 416*  LABPROT 21.3*  --   --   --   --   --   --   --  17.8* 15.9*  INR 1.9*  --   --   --   --   --   --   --  1.5* 1.3*  HEPARINUNFRC  --   --   --   --    < > 0.19*   < > 0.61 0.55 0.36  CREATININE 0.93  --   --   --   --  0.94  --   --   --   --   TROPONINIHS 242* 225* 265* 232*  --   --   --   --   --   --    < > = values in this interval not displayed.    Estimated Creatinine Clearance: 66.7 mL/min (by C-G formula based on SCr of 0.94 mg/dL).  Assessment:  73 yo M on warfarin PTA for hx mechanical AVR.  Warfarin was held and pt was bridged with heparin for cardiac cath (completed on 10/12).  Patient was previously therapeutic on heparin at 1550 units/hr.  To resume heparin 4 hours after sheath pull.  Also to restart warfarin dosing tonight.  PTA warfarin dose 5mg  daily except 10mg  on TTSun  10/13 AM update: heparin level therapeutic x 1 after re-start s/p cath  Goal of Therapy:  Heparin level 0.3-0.7 units/ml  INR goal 2.5-3.5 Monitor platelets by anticoagulation protocol: Yes   Plan:  Cont heparin at 1550 units/hr 1200 heparin level  Narda Bonds, PharmD, BCPS Clinical Pharmacist Phone: [phone]

## 2019-09-19 NOTE — Telephone Encounter (Signed)
-----   Message from Daune Perch, NP sent at 09/19/2019 11:28 AM EDT ----- Regarding: Needs post hosp appt This pt is being discharged from the hospital today. He has mechanical AVR. INR is 1.3 today. We will discharge on Lovenox bridging. Dr. Sallyanne Kuster would like pt seen in coumadin clinic on Friday.   Pecolia Ades, NP [phone]

## 2019-09-19 NOTE — Telephone Encounter (Signed)
Received message from Pecolia Ades and called pt and made him an appt on Friday per message. Pt states he is still in the hospital at this time and hasn't received d/c papers at this time, advised that we received a message to make him an appt on Friday for follow up. Pt states he will review d/c papers and inquire about his dosing as they are guiding his therapy at this time and he agreed to an appt on Friday at 215pm.

## 2019-09-19 NOTE — Progress Notes (Signed)
Progress Note  Patient Name: [name] [name] Date of Encounter: 09/19/2019  Primary Cardiologist: [name] Rouge, MD   Subjective   Feels well. No angina. Eager to go home today. He has self-administered enoxaparin for "bridging" before.  Inpatient Medications    Scheduled Meds: . acidophilus  1 capsule Oral Daily  . amLODipine  10 mg Oral Daily  . aspirin EC  81 mg Oral Daily  . atorvastatin  80 mg Oral Daily  . canagliflozin  100 mg Oral Daily  . furosemide  20 mg Oral Daily  . metoprolol succinate  50 mg Oral Daily  . sodium chloride flush  3 mL Intravenous Q12H  . trandolapril  4 mg Oral Daily  . Warfarin - Pharmacist Dosing Inpatient   Does not apply q1800   Continuous Infusions: . sodium chloride    . heparin 1,550 Units/hr (09/19/19 0300)   PRN Meds: sodium chloride, acetaminophen, morphine injection, nitroGLYCERIN, ondansetron (ZOFRAN) IV, sodium chloride flush   Vital Signs    Vitals:   09/18/19 1928 09/19/19 0027 09/19/19 0430 09/19/19 0817  BP: (!) 100/38 (!) 111/33 (!) 109/47 (!) 127/41  Pulse: 78 85 80 90  Resp: 18 18 18 18   Temp: 98.6 F (37 C) 98.4 F (36.9 C) 98.4 F (36.9 C) 98.4 F (36.9 C)  TempSrc: Oral Oral Oral Oral  SpO2: 91% 91% 91% 96%  Weight:   77.2 kg   Height:        Intake/Output Summary (Last 24 hours) at 09/19/2019 0859 Last data filed at 09/19/2019 0820 Gross per 24 hour  Intake 1857.96 ml  Output 1230 ml  Net 627.96 ml   Last 3 Weights 09/19/2019 09/18/2019 09/17/2019  Weight (lbs) 170 lb 1.6 oz 168 lb 1.6 oz 168 lb 3.2 oz  Weight (kg) 77.157 kg 76.25 kg 76.295 kg      Telemetry    SR with frequent monomorphic PVCs - Personally Reviewed  ECG    No new tracing - Personally Reviewed  Physical Exam  Appears well GEN: No acute distress.   Neck: No JVD Cardiac: RRR, crisp mechanical valve clicks, early peaking systolic ejection murmur aortic focus, rubs, or gallops.  No bleeding/hematoma at access site.  Respiratory: Clear to auscultation bilaterally. GI: Soft, nontender, non-distended  MS: No edema; No deformity. Neuro:  Nonfocal  Psych: Normal affect   Labs    High Sensitivity Troponin:   Recent Labs  Lab 09/16/19 1008 09/16/19 1221 09/16/19 1646 09/16/19 1849  TROPONINIHS 242* 225* 265* 232*      Chemistry Recent Labs  Lab 09/16/19 1008 09/17/19 0613 09/19/19 0436  NA 135 138 140  K 4.4 3.5 4.0  CL 99 99 105  CO2 24 25 23   GLUCOSE 214* 115* 115*  BUN 13 13 14   CREATININE 0.93 0.94 0.89  CALCIUM 9.1 8.6* 8.6*  PROT 7.0  --   --   ALBUMIN 3.7  --   --   AST 31  --   --   ALT 29  --   --   ALKPHOS 61  --   --   BILITOT 0.8  --   --   GFRNONAA >60 >60 >60  GFRAA >60 >60 >60  ANIONGAP 12 14 12      Hematology Recent Labs  Lab 09/17/19 0613 09/18/19 0639 09/19/19 0436  WBC 7.8 6.9 7.7  RBC 3.47* 3.37* 3.48*  HGB 9.4* 9.4* 9.5*  HCT 30.3* 28.9* 30.5*  MCV 87.3 85.8 87.6  MCH  27.1 27.9 27.3  MCHC 31.0 32.5 31.1  RDW 19.0* 19.0* 19.4*  PLT 404* 401* 416*    BNP Recent Labs  Lab 09/16/19 1008  BNP 676.4*     DDimer No results for input(s): DDIMER in the last 168 hours.   Radiology    Nm Myocar Multi W/spect W/wall Motion / Ef  Result Date: 09/17/2019 CLINICAL DATA:  73 year old male with history of CAD and CABG. Chest pain and palpitations. EXAM: MYOCARDIAL IMAGING WITH SPECT (REST AND PHARMACOLOGIC-STRESS) GATED LEFT VENTRICULAR WALL MOTION STUDY LEFT VENTRICULAR EJECTION FRACTION TECHNIQUE: Standard myocardial SPECT imaging was performed after resting intravenous injection of 10 mCi Tc-73m tetrofosmin. Subsequently, intravenous infusion of Lexiscan was performed under the supervision of the Cardiology staff. At peak effect of the drug, 30 mCi Tc-77m tetrofosmin was injected intravenously and standard myocardial SPECT imaging was performed. Quantitative gated imaging was also performed to evaluate left ventricular wall motion, and estimate left  ventricular ejection fraction. COMPARISON:  None. FINDINGS: Perfusion: Moderate to large region of decreased perfusion in the apical and mid segment of the lateral wall. This defect is present on rest and stress however slightly larger on stress suggesting a rim of peri-infarct ischemia. Second moderate size defect in the mid and basilar segment of the septal wall which is fixed on rest and stress. Wall Motion: LEFT ventricular dilatation which is similar rest and stress. Mild septal hypokinesia/dyskinesia. Left Ventricular Ejection Fraction: 35 % End diastolic volume 702 ml End systolic volume 637 ml IMPRESSION: 1. Moderate size infarction in the mid lateral wall with small rim of peri-infarct ischemia. Second moderate-size infarction in the basilar segment of the septal wall. 2. LEFT ventricular dilatation and septal hypokinesia/dyskinesia. 3. Left ventricular ejection fraction 35% 4. Non invasive risk stratification*: High (risk stratification primarily based on low ejection fraction) *2012 Appropriate Use Criteria for Coronary Revascularization Focused Update: J Am Coll Cardiol. 8588;50(2):774-128. http://content.airportbarriers.com.aspx?articleid=1201161 Electronically Signed   By: [name] [name] M.D.   On: 09/17/2019 11:33    Cardiac Studies   Cardiac cath 09/18/2019 IMPRESSION: [name] [name] has a patent LIMA to admit occluded LAD and a patent sequential vein to the PDA and PLA of an occluded dominant right.  He is current 99% calcified left main jeopardizing a moderately large ramus, high first marginal and nondominant circumflex which has a 90% mid AV groove circumflex stenosis.  I believe at this point aggressive medical therapy should be pursued.  If he fails medical therapy he would need orbital atherectomy and stenting of his left main.  In addition, I did demonstrate high-grade in-stent restenosis within the proximal right SFA stent corresponding to his recent Doppler study probably  contributing to his right lower extremity claudication.  I will address this in the future.       ECHO 09/17/2019: 1. Left ventricular ejection fraction, by visual estimation, is 50 to 55%. The left ventricle has normal function. Normal left ventricular size. There is mildly increased left ventricular hypertrophy. 2. Left ventricular diastolic Doppler parameters are consistent with pseudonormalization pattern of LV diastolic filling. 3. Global right ventricle has normal systolic function.The right ventricular size is normal. 4. Left atrial size was normal. 5. Right atrial size was normal. 6. Mild mitral annular calcification. 7. The mitral valve is normal in structure. Trace mitral valve regurgitation. No evidence of mitral stenosis. 8. The tricuspid valve is normal in structure. Tricuspid valve regurgitation is trivial. 9. Aortic valve regurgitation was not visualized by color flow Doppler. 10. The pulmonic valve was  normal in structure. Pulmonic valve regurgitation is not visualized by color flow Doppler. 11. The inferior vena cava is normal in size with greater than 50% respiratory variability, suggesting right atrial pressure of 3 mmHg. 12. Septal hypokinesis with overall preserved LV function; grade 2 diastolic dysfunction; mild LVH; s/p AVR with mean gradient 13 mmHg and no AI.  Nuclear study 09/17/2019 FINDINGS: Perfusion: Moderate to large region of decreased perfusion in the apical and mid segment of the lateral wall. This defect is present on rest and stress however slightly larger on stress suggesting a rim of peri-infarct ischemia. Second moderate size defect in the mid and basilar segment of the septal wall which is fixed on rest and stress. Wall Motion: LEFT ventricular dilatation which is similar rest and stress. Mild septal hypokinesia/dyskinesia. Left Ventricular Ejection Fraction: 35 %  IMPRESSION: 1. Moderate size infarction in the mid lateral wall with  small rim of peri-infarct ischemia. Second moderate-size infarction in the basilar segment of the septal wall. 2. LEFT ventricular dilatation and septal hypokinesia/dyskinesia. 3. Left ventricular ejection fraction 35% 4. Non invasive risk stratification*: High (risk stratification primarily based on low ejection fraction) Patient Profile     73 y.o. male with hx of CAD status post CABG withmechanicalAVR in 2004,on coumadin, DM, HTN, HLD, ITP, PAD s/p aobifem bypass, L iliac stent,R SFA stent with severe restenosis, TIA and recurrent GI bleed seen for chest pain.  Assessment & Plan    1. Acute diastolic HF: resolved, now on oral diuretics. Appears euvolemic. EF 50-55% by echo (more reliable than nuclear QGS, especially w frequent PVCs). In/out net neutral on PO diuretics in last 24h (3L diuresis since admission) 2. CAD s/p CABG:  Anatomy difficult for PCI. Would need LMCA atherectomy with jeopardy to entire lateral wall, without ability to place Impella or IABP due to mechanical AVR and PAD. Try medical therapy aggressively first. On max dose amlodipine. BP precludes more beta blocker unless we reduce the ACEi dose.  3. Mechanical AVR placed in 2004. Normal function on echo. Will need enoxaparin bridging to warfarin until INR >2.. 4. Hypertension: Controlled. Decrease trandolapril to increase metoprolol. Add long acting nitrates. 5. Hyperlipidemia: Continue atorvastatin. He is on Repatha at home. 6. PAD: Status post lower extremity bypass surgery and left iliac stenting and R SFA stenting (with evidence of restenosis in latter). Continue aspirin and statin. F/U Dr. Gwenlyn Found. 7. History of GI bleed: Hospitalized for this in late September. He underwent clipping for solitary ulcers in the cecum and proximal colon. Hemoglobin stable: 9.5 today, 9.4 yesterday, 10.2 on admission.  After med changes will ambulate. If he feels well (no angina, no hypotension), will DC today with  enoxaparin bridging, coumadin clinic follow up on Friday and TOC visit in < 2 weeks w Dr. Asa Lente or Dr. Gwenlyn Found (if APP only available, he prefers Truitt Merle, NP).  For questions or updates, please contact [place] Please consult [email or web address] for contact info under        Signed, Sanda Klein, MD  09/19/2019, 8:59 AM

## 2019-09-19 NOTE — Telephone Encounter (Signed)
New message   Per Daune Perch scheduled [facility] visit with Dr. Johnsie Cancel on 10/02/19 at 10:45 am.

## 2019-09-19 NOTE — Discharge Instructions (Signed)

## 2019-09-19 NOTE — Progress Notes (Signed)
ANTICOAGULATION CONSULT NOTE - Initial Consult  Pharmacy Consult for Lovenox + Coumadin Indication: mech AVR  No Known Allergies  Patient Measurements: Height: 5\' 8"  (172.7 cm) Weight: 170 lb 1.6 oz (77.2 kg)(scale c) IBW/kg (Calculated) : 68.4  Vital Signs: Temp: 98.3 F (36.8 C) (10/13 1109) Temp Source: Oral (10/13 1109) BP: 118/37 (10/13 1109) Pulse Rate: 73 (10/13 1109)  Labs: Recent Labs    09/16/19 1221 09/16/19 1646 09/16/19 1849  09/17/19 0613  09/17/19 2307 09/18/19 0639 09/19/19 0436  HGB  --   --   --    < > 9.4*  --   --  9.4* 9.5*  HCT  --   --   --   --  30.3*  --   --  28.9* 30.5*  PLT  --   --   --   --  404*  --   --  401* 416*  LABPROT  --   --   --   --   --   --   --  17.8* 15.9*  INR  --   --   --   --   --   --   --  1.5* 1.3*  HEPARINUNFRC  --   --   --    < > 0.19*   < > 0.61 0.55 0.36  CREATININE  --   --   --   --  0.94  --   --   --  0.89  TROPONINIHS 225* 265* 232*  --   --   --   --   --   --    < > = values in this interval not displayed.    Estimated Creatinine Clearance: 70.4 mL/min (by C-G formula based on SCr of 0.89 mg/dL).   Medical History: Past Medical History:  Diagnosis Date  . Adenomatous colon polyp 09/1997  . Anemia   . Aortic stenosis    s/p [facility] mechanical AVR - Chronic Coumadin  . Blood transfusion    "related to ITP"  . Coronary artery disease    s/p cabg x 3 11/2003: lima-lad, seq vg to rpda and rpl  . Diverticulitis of colon   . Heart murmur   . Hyperlipidemia   . Hypertension   . ITP (idiopathic thrombocytopenic purpura)   . Peripheral arterial disease ([place])    a. history of aortobifemoral bypass grafting by Dr. Sherren Mocha early b. LE angiography 04/22/2015 patent aortobifem graft, DES to R SFA  . Peripheral vascular disease ([place])    s/p Left external Iliac Artery stenting and subsequent left femoral endarterectomy 02/2011- post op course complicated by wound infxn req I&D 03/2011  . Renal artery stenosis,  native, bilateral ([facility])    a. bilateral renal artery stenosis by recent duplex ultrasound b. L renal artery stent 02/2015, R renal artery patent on angiogram  . Stroke ([place])   . TIA (transient ischemic attack) ~ 2013  . Type II diabetes mellitus ([facility])    Assessment:  Anticoag: Coumadin PTA for mech AVR >> Heparin for ACS, rectal bleed 9/27, s/p cath 10/12 - HL 0.36. INR 1.3, Hgb 9.5 stable. Plts 416 stable. - PTA dose: Warfarin 5mg  daily except 10mg  TTSun (admit INR  Only 1.9)  Goal of Therapy:  INR 2-3 Monitor platelets by anticoagulation protocol: Yes   Plan:  Change IV heparin to Lovenox 80mg  BID for discharge Warfarin 10mg  PO x 1 again tonight. Daily HL, CBC, and INR   Neale Marzette S. Alford Highland,  PharmD, BCPS Clinical Staff Pharmacist [phone] Eilene Ghazi Stillinger 09/19/2019,11:29 AM

## 2019-09-19 NOTE — Progress Notes (Signed)
Patient ambulated in the hallway. Initial BP 125/35 HR 76 and after ambulation BP 134/34 HR 80 O2 Sat sustained at 95%

## 2019-09-19 NOTE — Discharge Summary (Signed)
Discharge Summary    Patient ID: [name] [name] MRN: [id number]; DOB: June 25, 1946  Admit date: 09/16/2019 Discharge date: 09/19/2019  Primary Care Provider: Shon Baton, MD  Primary Cardiologist: Jenkins Rouge, MD  Primary Electrophysiologist:  None   Discharge Diagnoses    Principal Problem:   Unstable angina [facility]) Active Problems:   Essential hypertension   Coronary artery disease   Hypertension   H/O mechanical aortic valve replacement   Acute diastolic heart failure ([place])   History of GI bleed   Allergies No Known Allergies  Diagnostic Studies/Procedures    CORONARY/GRAFT ANGIOGRAPHY 09/19/2019  Conclusion    Ost RCA to Prox RCA lesion is 100% stenosed.  Ost LM to Mid LM lesion is 99% stenosed.  Dist LM to Prox LAD lesion is 100% stenosed.  Mid Cx lesion is 90% stenosed.   IMPRESSION: [name] has a patent LIMA to admit occluded LAD and a patent sequential vein to the PDA and PLA of an occluded dominant right.  He is current 99% calcified left main jeopardizing a moderately large ramus, high first marginal and nondominant circumflex which has a 90% mid AV groove circumflex stenosis.  I believe at this point aggressive medical therapy should be pursued.  If he fails medical therapy he would need orbital atherectomy and stenting of his left main.  In addition, I did demonstrate high-grade in-stent restenosis within the proximal right SFA stent corresponding to his recent Doppler study probably contributing to his right lower extremity claudication.  I will address this in the future.  The sheath will be removed and pressure held.  I will restart heparin 4 hours without a bolus and Coumadin can then be restarted for his mechanical aortic valve.  He left lab in stable condition.  Quay Burow. MD, [facility]   Echocardiogram 09/17/2019 IMPRESSIONS   1. Left ventricular ejection fraction, by visual estimation, is 50 to 55%. The left ventricle has normal  function. Normal left ventricular size. There is mildly increased left ventricular hypertrophy.  2. Left ventricular diastolic Doppler parameters are consistent with pseudonormalization pattern of LV diastolic filling.  3. Global right ventricle has normal systolic function.The right ventricular size is normal.  4. Left atrial size was normal.  5. Right atrial size was normal.  6. Mild mitral annular calcification.  7. The mitral valve is normal in structure. Trace mitral valve regurgitation. No evidence of mitral stenosis.  8. The tricuspid valve is normal in structure. Tricuspid valve regurgitation is trivial.  9. Aortic valve regurgitation was not visualized by color flow Doppler. 10. The pulmonic valve was normal in structure. Pulmonic valve regurgitation is not visualized by color flow Doppler. 11. The inferior vena cava is normal in size with greater than 50% respiratory variability, suggesting right atrial pressure of 3 mmHg. 12. Septal hypokinesis with overall preserved LV function; grade 2 diastolic dysfunction; mild LVH; s/p AVR with mean gradient 13 mmHg and no AI. _____________   History of Present Illness     [name] [name] is a 73 y.o. male with hx of CAD status post CABG with mechanical AVR in 2004, on coumadin, DM, HTN, HLD, ITP, PAD s/p aobifem, L iliac stent, TIA and recurrent GI bleed seen for chest pain.   Last echocardiogram January 2019 showed normal LV function, normal-appearing mechanical AVR with no perivalvular regurgitation.  Patient has extensive history of peripheral vascular disease and followed by Dr. Gwenlyn Found with multiple intervention.  He was last seen March 2020 when admitted for acute GI  bleed in setting of supratherapeutic INR.  He received vitamin K.  Repeat colonoscopy 9/27 with findings of solitary ulcers in the cecum and proximal colon s/p clipping; discharged on Coumadin with Lovenox bridge.  Since discharge pt has not felt well, low energy.  Hemoglobin 10.2 (stable from last discharge). INR 1.9.  He underwent CT angiogram of chest which was negative for pulmonary embolism but minimal pulmonary edema. Started in heparin by ER provider.   Concern for progressively worsening chest pain, relieved with rest.  Differential includes ACS, CHF, deconditioning or arrhythmia.  EKG without acute abnormality.  High-sensitivity troponin 242>>225.  BNP 676.  Hospital Course     Consultants: None  1. Acute diastolicHF:resolved, now on oral diuretics. Appears euvolemic. EF 50-55% by echo (more reliable than nuclear QGS, especially w frequent PVCs). In/out net neutral on PO diuretics in last 24h (3L diuresis since admission) 2.CAD s/pCABG:Cath done yesterday. Anatomy difficult for PCI. Would need LMCA atherectomy with jeopardy to entire lateral wall, without ability to place Impella or IABP due to mechanical AVR and PAD. Try medical therapy aggressively first. On max dose amlodipine. BP precludes more beta blocker unless we reduce the ACEi dose.  3. Mechanical AVRplaced in 2004. Normal function on echo. Will need enoxaparin bridging to warfarin until INR >2.  4. Hypertension: Controlled. Decrease trandolapril to increase metoprolol. Add long acting nitrates. 5. Hyperlipidemia:Continue atorvastatin. He is on Repatha at home. 6. PAD: Status post lower extremity bypass surgery and left iliac stenting and R SFA stenting (with evidence of restenosis in latter). Continue aspirin and statin.F/U Dr. Gwenlyn Found. 7. History of GI bleed: Hospitalized for this in late September. He underwent clipping for solitary ulcers in the cecum and proximal colon. Hemoglobin stable: 9.5 today, 9.4 yesterday, 10.2 on admission.  After med changes pt ambulated and he feels well (no angina, no hypotension), will DC today with enoxaparin bridging, coumadin clinic follow up on Friday and TOC visit in < 2 weeks w Dr. Johnsie Cancel or Dr. Gwenlyn Found (if APP only available, he  prefers Truitt Merle, NP).  Patient has been seen by Dr. Sallyanne Kuster today and deemed ready for discharge home. All follow up appointments have been scheduled. Discharge medications are listed below.   Did the patient have an acute coronary syndrome (MI, NSTEMI, STEMI, etc) this admission?:  No.   The elevated Troponin was due to the acute medical illness (demand ischemia).  _____________  Discharge Vitals Blood pressure (!) 134/34, pulse 80, temperature 98.3 F (36.8 C), temperature source Oral, resp. rate 16, height 5\' 8"  (1.727 m), weight 77.2 kg, SpO2 95 %.  Filed Weights   09/17/19 0515 09/18/19 0449 09/19/19 0430  Weight: 76.3 kg 76.2 kg 77.2 kg    Labs & Radiologic Studies    CBC Recent Labs    09/18/19 0639 09/19/19 0436  WBC 6.9 7.7  HGB 9.4* 9.5*  HCT 28.9* 30.5*  MCV 85.8 87.6  PLT 401* 937*   Basic Metabolic Panel Recent Labs    09/17/19 0613 09/19/19 0436  NA 138 140  K 3.5 4.0  CL 99 105  CO2 25 23  GLUCOSE 115* 115*  BUN 13 14  CREATININE 0.94 0.89  CALCIUM 8.6* 8.6*   Liver Function Tests No results for input(s): AST, ALT, ALKPHOS, BILITOT, PROT, ALBUMIN in the last 72 hours. No results for input(s): LIPASE, AMYLASE in the last 72 hours. High Sensitivity Troponin:   Recent Labs  Lab 09/16/19 1008 09/16/19 1221 09/16/19 1646 09/16/19 1849  TROPONINIHS  242* 225* 265* 232*    BNP Invalid input(s): POCBNP D-Dimer No results for input(s): DDIMER in the last 72 hours. Hemoglobin A1C No results for input(s): HGBA1C in the last 72 hours. Fasting Lipid Panel Recent Labs    09/17/19 0613  CHOL 91  HDL 54  LDLCALC 28  TRIG 43  CHOLHDL 1.7   Thyroid Function Tests No results for input(s): TSH, T4TOTAL, T3FREE, THYROIDAB in the last 72 hours.  Invalid input(s): FREET3 _____________  Dg Chest 2 View  Result Date: 09/16/2019 CLINICAL DATA:  Short of breath. Chest pain for 2 weeks. EXAM: CHEST - 2 VIEW COMPARISON:  02/23/2019 FINDINGS:  Numerous leads and wires project over the chest. Midline trachea. Mild cardiomegaly. Atherosclerosis in the transverse aorta. Prior median sternotomy. No pleural effusion or pneumothorax. Hyperinflation mild interstitial thickening. No lobar consolidation. IMPRESSION: No acute cardiopulmonary disease. Hyperinflation interstitial thickening, likely due to smoking/chronic bronchitis. Electronically Signed   By: Abigail Miyamoto M.D.   On: 09/16/2019 11:11   Ct Angio Chest Pe W/cm &/or Wo Cm  Result Date: 09/16/2019 CLINICAL DATA:  Chest pain with tightness and burning sensations as well as palpitations for the past 4-5 days. EXAM: CT ANGIOGRAPHY CHEST WITH CONTRAST TECHNIQUE: Multidetector CT imaging of the chest was performed using the standard protocol during bolus administration of intravenous contrast. Multiplanar CT image reconstructions and MIPs were obtained to evaluate the vascular anatomy. CONTRAST:  24mL OMNIPAQUE IOHEXOL 350 MG/ML SOLN COMPARISON:  Chest radiographs obtained earlier today. FINDINGS: Cardiovascular: Post CABG changes. Dense atheromatous arterial calcifications, including the aorta and coronary arteries. Prosthetic aortic valve. Normally opacified pulmonary arteries with no pulmonary arterial filling defects seen. Mediastinum/Nodes: No enlarged mediastinal, hilar, or axillary lymph nodes. Thyroid gland, trachea, and esophagus demonstrate no significant findings. Lungs/Pleura: Small right pleural effusion and minimal left pleural effusion. Minimal patchy interstitial prominence in both lungs with minimal centrilobular bullous changes. Upper Abdomen: Surgically absent spleen. Mildly nodular liver contours with a prominent caudate lobe and prominent lateral segment left lobe. Dense atheromatous arterial calcifications, including both proximal renal arteries an the superior mesenteric artery. Musculoskeletal: Minimal thoracic spine degenerative changes. Review of the MIP images confirms the  above findings. IMPRESSION: 1. No pulmonary emboli. 2. Small right pleural effusion and minimal left pleural effusion. 3. Minimal patchy interstitial prominence in both lungs, compatible with minimal interstitial pulmonary edema. 4. Minimal changes of COPD and chronic bronchitis. 5. Post CABG changes with dense calcific coronary artery and aortic atherosclerosis. Aortic Atherosclerosis (ICD10-I70.0) and Emphysema (ICD10-J43.9). Electronically Signed   By: Claudie Revering M.D.   On: 09/16/2019 12:10   Nm Myocar Multi W/spect W/wall Motion / Ef  Result Date: 09/17/2019 CLINICAL DATA:  73 year old male with history of CAD and CABG. Chest pain and palpitations. EXAM: MYOCARDIAL IMAGING WITH SPECT (REST AND PHARMACOLOGIC-STRESS) GATED LEFT VENTRICULAR WALL MOTION STUDY LEFT VENTRICULAR EJECTION FRACTION TECHNIQUE: Standard myocardial SPECT imaging was performed after resting intravenous injection of 10 mCi Tc-40m tetrofosmin. Subsequently, intravenous infusion of Lexiscan was performed under the supervision of the Cardiology staff. At peak effect of the drug, 30 mCi Tc-82m tetrofosmin was injected intravenously and standard myocardial SPECT imaging was performed. Quantitative gated imaging was also performed to evaluate left ventricular wall motion, and estimate left ventricular ejection fraction. COMPARISON:  None. FINDINGS: Perfusion: Moderate to large region of decreased perfusion in the apical and mid segment of the lateral wall. This defect is present on rest and stress however slightly larger on stress suggesting a rim of peri-infarct  ischemia. Second moderate size defect in the mid and basilar segment of the septal wall which is fixed on rest and stress. Wall Motion: LEFT ventricular dilatation which is similar rest and stress. Mild septal hypokinesia/dyskinesia. Left Ventricular Ejection Fraction: 35 % End diastolic volume 829 ml End systolic volume 937 ml IMPRESSION: 1. Moderate size infarction in the mid  lateral wall with small rim of peri-infarct ischemia. Second moderate-size infarction in the basilar segment of the septal wall. 2. LEFT ventricular dilatation and septal hypokinesia/dyskinesia. 3. Left ventricular ejection fraction 35% 4. Non invasive risk stratification*: High (risk stratification primarily based on low ejection fraction) *2012 Appropriate Use Criteria for Coronary Revascularization Focused Update: J Am Coll Cardiol. 1696;78(9):381-017. http://content.airportbarriers.com.aspx?articleid=1201161 Electronically Signed   By: Suzy Bouchard M.D.   On: 09/17/2019 11:33   Disposition   Pt is being discharged home today in good condition.  Follow-up Plans & Appointments    Follow-up Information    Josue Hector, MD Follow up.   Specialty: Cardiology Why: Cardiology hospital follow up on 10/02/2019 at 10:45. Please arrive 15 minutes early for check in.  Contact information: [address] [address] [phone]        [place] [facility] Follow up.   Specialty: Cardiology Why: Please have blood checked for coumadin on Friday 09/22/2019 at 2:15.  Contact information: [address], [place] [phone]         Discharge Instructions    ([place]) Call MD:  Anytime you have any of the following symptoms: 1) 3 pound weight gain in 24 hours or 5 pounds in 1 week 2) shortness of breath, with or without a dry hacking cough 3) swelling in the hands, feet or stomach 4) if you have to sleep on extra pillows at night in order to breathe.   Complete by: As directed    Diet - low sodium heart healthy   Complete by: As directed    Discharge instructions   Complete by: As directed    PLEASE REMEMBER TO BRING ALL OF YOUR MEDICATIONS TO EACH OF YOUR FOLLOW-UP OFFICE VISITS.  PLEASE ATTEND ALL SCHEDULED FOLLOW-UP APPOINTMENTS.   Activity: Increase activity slowly as tolerated.  You may shower, but no soaking baths (or swimming) for 1 week. No driving for 24 hours. No lifting over 5 lbs for 1 week. No sexual activity for 1 week.   You May Return to Work: in 1 week (if applicable)  Wound Care: You may wash cath site gently with soap and water. Keep cath site clean and dry. If you notice pain, swelling, bleeding or pus at your cath site, please call [phone].   Increase activity slowly   Complete by: As directed       Discharge Medications   Allergies as of 09/19/2019   No Known Allergies     Medication List    STOP taking these medications   hydrochlorothiazide 12.5 MG capsule Commonly known as: MICROZIDE     TAKE these medications   acidophilus Caps capsule Take 1 capsule by mouth daily.   amLODipine 10 MG tablet Commonly known as: NORVASC Take 10 mg by mouth daily.   aspirin 81 MG EC tablet Take 1 tablet (81 mg total) by mouth daily. Start taking on: September 20, 2019   atorvastatin 80 MG tablet Commonly known as: LIPITOR Take 80 mg by mouth daily.   canagliflozin 100 MG Tabs tablet Commonly known as:  INVOKANA Take 100 mg by mouth daily.   enoxaparin 80 MG/0.8ML injection Commonly known as: LOVENOX Inject 0.8 mLs (80 mg total) into the skin every 12 (twelve) hours.   furosemide 20 MG tablet Commonly known as: LASIX Take 1 tablet (20 mg total) by mouth daily. Start taking on: September 20, 2019   glyBURIDE 2.5 MG tablet Commonly known as: DIABETA Take 5 mg by mouth 2 (two) times daily with a meal.   isosorbide mononitrate 30 MG 24 hr tablet Commonly known as: IMDUR Take 1 tablet (30 mg total) by mouth daily. Start taking on: September 20, 2019   metoprolol succinate 100 MG 24 hr tablet Commonly known as: TOPROL-XL Take 1 tablet (100 mg total) by mouth daily. Take with or immediately following a meal. Start taking on: September 20, 2019 What changed:   medication strength  See the new instructions.   nitroGLYCERIN 0.4 MG SL  tablet Commonly known as: NITROSTAT Place 1 tablet (0.4 mg total) under the tongue every 5 (five) minutes x 3 doses as needed for chest pain.   Repatha SureClick 098 MG/ML Soaj Generic drug: Evolocumab Inject 140 mg into the skin every 14 (fourteen) days.   trandolapril 1 MG tablet Commonly known as: MAVIK Take 1 tablet (1 mg total) by mouth daily. Start taking on: September 20, 2019 What changed:   medication strength  how much to take   warfarin 10 MG tablet Commonly known as: COUMADIN Take as directed. If you are unsure how to take this medication, talk to your nurse or doctor. Original instructions: Take 5 mg on Mon/Wed/Fri and 10 mg on Tue/Thur/Sat/Sun. What changed: See the new instructions.         Outstanding Labs/Studies   INR on Friday  Duration of Discharge Encounter   Greater than 30 minutes including physician time.  Signed, Daune Perch, NP 09/19/2019, 3:27 PM

## 2019-09-20 ENCOUNTER — Other Ambulatory Visit: Payer: Self-pay

## 2019-09-20 NOTE — Telephone Encounter (Signed)
**Note De-Identified Carena [place] [facility]bfuscation** TCM Call: 1st Attempt I left a message on the pts VM asking him to call me back. Office phone # left in message.

## 2019-09-20 NOTE — Consult Note (Signed)
Follow up: High risk  Patient transitioned home, high risk for unplanned readmission at 22%.  Primary Care Provider:  Dr. Shon Baton, [place], this office is not listed to provide the Transition of Care follow up.  Assigned to General EMMI calls for post hospital follow up.  Natividad Brood, RN BSN [facility] Liaison  [phone] business mobile phone Toll free office [phone]  Fax number: [phone] [email or web address]

## 2019-09-21 NOTE — Telephone Encounter (Addendum)
**Note De-Identified Shaneka [place] Obfuscation** TCM Call: 2nd attempt LMTCB

## 2019-09-21 NOTE — Progress Notes (Signed)
CARDIOLOGY OFFICE NOTE  Date:  09/22/2019    [name] [name] Date of Birth: 1946/06/08 Medical Record #[id number]  PCP:  Shon Baton, MD  Cardiologist:  Wyn Forster chief complaint on file.   History of Present Illness:  73 y.o. vasculopath. Seen extensively by Dr Gwenlyn Found for his PVD. He has had aortobifemoral bypass in 2016 with f/u left illiac stenting and femoral endarterectomy Subsequent ostial and mid right SFA intervention 09/2016 Also history of left renal artery stenting with repeat intervention on left and 75% RRA stenosis on most recent duplex 07/12/18  I believe this is being observed since his renal function and BP are stable.   CAD/CABG with AVR in 2004. Had TIA with some facial droop January 2019 with subRx INR Requires lovenox bridging Carotids with no significant stenosis on duplex 12/14/17  Started on Repatha this year and labs followed by [name] primary   Admitted 9/26-928 for GI bleed with ulcers in cecum/proximal colon had multiple polyps removed and some gastritis with biopsy complicated by post polypectomy bleed and repeat colonoscopy clipping   Admitted to hospital 09/16/19 with dyspnea and chest pain. Troponin mildly elevated 242 BNP676 Hb 10/2 INR was 1.9 at time Cath by Dr Gwenlyn Found 09/18/19 showed patent LIMA to LAD, Patent SVG to PDA/PLB and 99% calcified LM jeopardizing moderately large Ramus Also noted high grade in stent restenosis of his right SFA stent causing his newly worsening claudication. Initial trial medical Rx warranted and if fails Consider LM atherectomy and stenting D/c on norvasc, imdur and Toprol.   He is retired from [employer], forensic of over 2 years. Not playing as much golf Divorced with two adult children Most of his family is in [place] Never got remarried   He has had no angina or CHF signs since d/c Currently not on aspirin  INR Rx today so I told him he could stop lovenox Does not have particularly worsening  claudication particularly in RLE    Past Medical History:  Diagnosis Date  . Adenomatous colon polyp 09/1997  . Anemia   . Aortic stenosis    s/p [facility] mechanical AVR - Chronic Coumadin  . Blood transfusion    "related to ITP"  . Coronary artery disease    s/p cabg x 3 11/2003: lima-lad, seq vg to rpda and rpl  . Diverticulitis of colon   . Heart murmur   . Hyperlipidemia   . Hypertension   . ITP (idiopathic thrombocytopenic purpura)   . Peripheral arterial disease ([place])    a. history of aortobifemoral bypass grafting by Dr. Sherren Mocha early b. LE angiography 04/22/2015 patent aortobifem graft, DES to R SFA  . Peripheral vascular disease ([place])    s/p Left external Iliac Artery stenting and subsequent left femoral endarterectomy 02/2011- post op course complicated by wound infxn req I&D 03/2011  . Renal artery stenosis, native, bilateral ([facility])    a. bilateral renal artery stenosis by recent duplex ultrasound b. L renal artery stent 02/2015, R renal artery patent on angiogram  . Stroke ([place])   . TIA (transient ischemic attack) ~ 2013  . Type II diabetes mellitus ([place])     Past Surgical History:  Procedure Laterality Date  . ABDOMINAL AORTAGRAM N/A 12/16/2011   Procedure: ABDOMINAL Maxcine Ham;  Surgeon: Sherren Mocha, MD;  Location: [facility] CATH LAB;  Service: Cardiovascular;  Laterality: N/A;  . ANGIOPLASTY / STENTING ILIAC     Left external Iliac Artery  . AORTA - BILATERAL  FEMORAL ARTERY BYPASS GRAFT  01/18/2012   Procedure: AORTA BIFEMORAL BYPASS GRAFT;  Surgeon: Curt Jews, MD;  Location: [place];  Service: Vascular;  Laterality: N/A;  . AORTIC VALVE REPLACEMENT  ~ 2004  . CARDIAC CATHETERIZATION  11/2003   /pt report 10/01/2016  . CARDIAC VALVE REPLACEMENT  11/2003   aortic  . CATARACT EXTRACTION W/ INTRAOCULAR LENS  IMPLANT, BILATERAL Bilateral   . COLONOSCOPY    . COLONOSCOPY WITH PROPOFOL N/A 09/03/2019   Procedure: COLONOSCOPY WITH PROPOFOL;  Surgeon: Doran Stabler, MD;   Location: [facility];  Service: Gastroenterology;  Laterality: N/A;  . CORONARY ARTERY BYPASS GRAFT  11/2003   Archie Endo 04/21/2011  . CORONARY/GRAFT ANGIOGRAPHY N/A 09/18/2019   Procedure: CORONARY/GRAFT ANGIOGRAPHY;  Surgeon: Lorretta Harp, MD;  Location: [place] CV LAB;  Service: Cardiovascular;  Laterality: N/A;  . ESOPHAGOGASTRODUODENOSCOPY (EGD) WITH PROPOFOL N/A 02/27/2019   Procedure: ESOPHAGOGASTRODUODENOSCOPY (EGD) WITH PROPOFOL;  Surgeon: Doran Stabler, MD;  Location: [place];  Service: Endoscopy;  Laterality: N/A;  . HEMOSTASIS CLIP PLACEMENT  09/03/2019   Procedure: HEMOSTASIS CLIP PLACEMENT;  Surgeon: Doran Stabler, MD;  Location: [facility];  Service: Gastroenterology;;  . LOWER EXTREMITY ANGIOGRAM N/A 02/21/2015   Procedure: LOWER EXTREMITY ANGIOGRAM;  Surgeon: Lorretta Harp, MD;  Location: [facility] CATH LAB;  Service: Cardiovascular;  Laterality: N/A;  . PERIPHERAL VASCULAR CATHETERIZATION N/A 04/22/2015   Procedure: Lower Extremity Angiography;  Surgeon: Lorretta Harp, MD;  Location: [place] CV LAB;  Service: Cardiovascular;  Laterality: N/A;  . PERIPHERAL VASCULAR CATHETERIZATION N/A 08/24/2016   Procedure: Lower Extremity Angiography;  Surgeon: Lorretta Harp, MD;  Location: [place] CV LAB;  Service: Cardiovascular;  Laterality: N/A;  . PERIPHERAL VASCULAR CATHETERIZATION Right 10/01/2016   Procedure: Peripheral Vascular Intervention - STENT;  Surgeon: Lorretta Harp, MD;  Location: [facility] CV LAB;  Service: Cardiovascular;  Laterality: Right;  Prox and MID SFA   . POLYPECTOMY    . RENAL ANGIOGRAM N/A 02/21/2015   Procedure: RENAL ANGIOGRAM;  Surgeon: Lorretta Harp, MD;  Location: [facility] CATH LAB;  Service: Cardiovascular;  Laterality: Bilateral; 6 mm x 12 mm long Herculink balloon expandable stent to the left renal artery  . RENAL ARTERY STENT Left 04/22/2015   dr berry  . SPLENECTOMY  02/2003   Archie Endo 04/21/2011  . TONSILLECTOMY  ~ 1952      Medications: No outpatient medications have been marked as taking for the 09/22/19 encounter (Appointment) with Josue Hector, MD.     Allergies: No Known Allergies  Social History: The patient  reports that he has been smoking cigarettes and cigars. He has smoked for the past 30.00 years. He has never used smokeless tobacco. He reports current alcohol use of about 16.0 standard drinks of alcohol per week. He reports that he does not use drugs.   Family History: The patient's family history includes Breast cancer in his sister; Coronary artery disease in his mother; Diabetes in his paternal aunt, paternal grandmother, and another family member; Heart disease in his father.   Review of Systems: Please see the history of present illness.   Otherwise, the review of systems is positive for none.   All other systems are reviewed and negative.   Physical Exam: VS:  There were no vitals taken for this visit. [name]  BMI There is no height or weight on file to calculate BMI.  Wt Readings from Last 3 Encounters:  09/19/19 170 lb  1.6 oz (77.2 kg)  09/03/19 166 lb 0.1 oz (75.3 kg)  08/30/19 172 lb (78 kg)   Affect appropriate Healthy:  appears stated age 37: normal Neck supple with no adenopathy JVP normal no bruits no thyromegaly Lungs clear with no wheezing and good diaphragmatic motion Heart:  N4/M7 click SEM  No AR  murmur, no rub, gallop or click PMI normal Abdomen: benighn, BS positve, no tenderness, post AortoBifem Femoral bruits   No HSM or HJR Post aorto bifem dicreased pedal pulses  No edema Neuro non-focal Skin warm and dry No muscular weakness    LABORATORY DATA:  EKG:   SR RAD rate 71 otherwise normal   Lab Results  Component Value Date   WBC 7.7 09/19/2019   HGB 9.5 (L) 09/19/2019   HCT 30.5 (L) 09/19/2019   PLT 416 (H) 09/19/2019   GLUCOSE 115 (H) 09/19/2019   CHOL 91 09/17/2019   TRIG 43 09/17/2019   HDL 54 09/17/2019   LDLCALC 28 09/17/2019   ALT  29 09/16/2019   AST 31 09/16/2019   NA 140 09/19/2019   K 4.0 09/19/2019   CL 105 09/19/2019   CREATININE 0.89 09/19/2019   BUN 14 09/19/2019   CO2 23 09/19/2019   TSH 2.178 12/13/2017   INR 3.2 (A) 09/22/2019   HGBA1C 7.2 (H) 09/03/2019   Lab Results  Component Value Date   INR 3.2 (A) 09/22/2019   INR 1.3 (H) 09/19/2019   INR 1.5 (H) 09/18/2019   PROTIME 16.1 05/22/2009   PROTIME 23.6 05/08/2009     BNP (last 3 results) Recent Labs    09/16/19 1008  BNP 676.4*    ProBNP (last 3 results) No results for input(s): PROBNP in the last 8760 hours.   Other Studies Reviewed Today:  Echo Study Conclusions 09/17/19 EF 50-55%  AVR no PVL and mean gradient 13 mmHg Diastolic dysfunction  Assessment/Plan:  1. Stroke from HTN/subtherapeutic INR - very little residual - doing well.. F/U Sethi requires lovenox bridging   2. HTN - Well controlled.  Continue current medications and low sodium Dash type diet.  Some white coat component    3. CAD/AS with remote CABG/AVR - Normal AVR function by echo 09/17/19 Recent SEMI 09/18/19 Rx medically with patent LIMA to LAD, SVG PLB/PDA possible need for LM atherectomy stenting if fails medical Rx Stable   4. Chronic anticoagulation - f/u coumadin clinic INR Rx d/c lovenox   5. PAD - followed by Dr. Gwenlyn Found and Early. In stent restenosis  Both by duplex in August of this year and on angiogramof  right SFA stent f/u Dr Gwenlyn Found can consider intervention   6. HLD - on Repatha followed by [name]  7. GI Bleed:  F/u Fuller Plan had gastritis with biopsy and multiple polyps removed DC HCt 30.5  Will slowly introduce baby ASA 81 mg back after  Month and qod for 4 weeks   Current medicines are reviewed with the patient today.  The patient does not have concerns regarding medicines other than what has been noted above.  The following changes have been made:  See above.  Labs/ tests ordered today include:   No orders of the defined types were placed  in this encounter.    Disposition:   FU with Dr Gwenlyn Found for PV next few weeks and me in 3 months F/U Dr Fuller Plan GI   Jenkins Rouge MD [facility]

## 2019-09-21 NOTE — Telephone Encounter (Signed)
The pt returned my call  Patient contacted regarding discharge from [facility] on 09/19/2019.  Patient understands to follow up with provider Dr Johnsie Cancel on 09/22/2019 at 3:15 at [facility] in [place], [place] [address]. Patient understands discharge instructions? Yes Patient understands medications and regiment? Yes Patient understands to bring all medications to this visit? Yes

## 2019-09-22 ENCOUNTER — Ambulatory Visit (INDEPENDENT_AMBULATORY_CARE_PROVIDER_SITE_OTHER): Payer: Medicare Other | Admitting: Cardiovascular Disease

## 2019-09-22 ENCOUNTER — Other Ambulatory Visit: Payer: Self-pay

## 2019-09-22 ENCOUNTER — Ambulatory Visit (INDEPENDENT_AMBULATORY_CARE_PROVIDER_SITE_OTHER): Payer: Medicare Other | Admitting: Pharmacist

## 2019-09-22 DIAGNOSIS — Z5181 Encounter for therapeutic drug level monitoring: Secondary | ICD-10-CM

## 2019-09-22 DIAGNOSIS — I739 Peripheral vascular disease, unspecified: Secondary | ICD-10-CM | POA: Diagnosis not present

## 2019-09-22 DIAGNOSIS — I701 Atherosclerosis of renal artery: Secondary | ICD-10-CM

## 2019-09-22 DIAGNOSIS — I214 Non-ST elevation (NSTEMI) myocardial infarction: Secondary | ICD-10-CM | POA: Diagnosis not present

## 2019-09-22 DIAGNOSIS — I359 Nonrheumatic aortic valve disorder, unspecified: Secondary | ICD-10-CM

## 2019-09-22 DIAGNOSIS — G459 Transient cerebral ischemic attack, unspecified: Secondary | ICD-10-CM

## 2019-09-22 LAB — POCT INR: INR: 3.2 — AB (ref 2.0–3.0)

## 2019-09-22 NOTE — Patient Instructions (Addendum)
STOP Lovenox and continue same dose of 5mg  daily except 10mg  on Sundays, Tuesdays, and Thursdays. Recheck INR in 2 weeks on 10/06/19 at 3:15PM. Call us with any medication changes or concerns Coumadin Clinic [phone], Main # [phone].

## 2019-09-22 NOTE — Patient Instructions (Addendum)
Medication Instructions:   *If you need a refill on your cardiac medications before your next appointment, please call your pharmacy*  Lab Work:  If you have labs (blood work) drawn today and your tests are completely normal, you will receive your results only by: [name] MyChart Message (if you have MyChart) OR . A paper copy in the mail If you have any lab test that is abnormal or we need to change your treatment, we will call you to review the results.  Testing/Procedures: None ordered today.  Follow-Up: At [facility], you and your health needs are our priority.  As part of our continuing mission to provide you with exceptional heart care, we have created designated Provider Care Teams.  These Care Teams include your primary Cardiologist (physician) and Advanced Practice Providers (APPs -  Physician Assistants and Nurse Practitioners) who all work together to provide you with the care you need, when you need it.  Your next appointment:   5 months  The format for your next appointment:   In Person  Provider:   You may see Jenkins Rouge, MD or one of the following Advanced Practice Providers on your designated Care Team:    Truitt Merle, NP  Cecilie Kicks, NP  Kathyrn Drown, NP  Your physician recommends that you schedule a follow-up appointment in November with Dr. Gwenlyn Found.

## 2019-09-26 DIAGNOSIS — E1151 Type 2 diabetes mellitus with diabetic peripheral angiopathy without gangrene: Secondary | ICD-10-CM | POA: Diagnosis not present

## 2019-09-26 DIAGNOSIS — E7849 Other hyperlipidemia: Secondary | ICD-10-CM | POA: Diagnosis not present

## 2019-09-27 ENCOUNTER — Other Ambulatory Visit: Payer: Self-pay | Admitting: *Deleted

## 2019-09-27 NOTE — Patient Outreach (Signed)
Red EMMI flag received for 09/26/19- Knows who to call for change in condition- No.  Outreach call to pt, spoke with pt, HIPAA verified, EMMI flag addressed,  Pt feels there is miscommunication about the question, RN CM reviewed who to call (primary MD, cardiologist) for change in condition or questions, 911 for emergency.  Pt verbalizes understanding, no other concerns verbalized.  Jacqlyn Larsen [facility] [place], [place] Coordinator [phone]

## 2019-09-29 ENCOUNTER — Telehealth: Payer: Self-pay | Admitting: Cardiovascular Disease

## 2019-09-29 DIAGNOSIS — R600 Localized edema: Secondary | ICD-10-CM

## 2019-09-29 DIAGNOSIS — R0602 Shortness of breath: Secondary | ICD-10-CM

## 2019-09-29 MED ORDER — FUROSEMIDE 20 MG PO TABS: 20.0000 mg | ORAL_TABLET | Freq: Two times a day (BID) | ORAL | 3 refills | Status: DC

## 2019-09-29 NOTE — Telephone Encounter (Signed)
Pt was in the office last week, and was started on some new medication, and he is questioning the effectiveness of the new medication  Pt mentioned Fuosemide, but upon confirming the medication he states he would just rather speak with the nurse

## 2019-09-29 NOTE — Telephone Encounter (Signed)
Patient calling complaining of swelling in ankles, tightness in chest with activity, trouble sleeping at night, having to sit up to sleep, and low energy. Patient stated he does not want to have this kind of life style. Patient stated he does not think his new medications are helping. Will forward to Dr. Johnsie Cancel for advisement.

## 2019-09-29 NOTE — Telephone Encounter (Signed)
Called patient back with Dr. Kyla Balzarine recommendations. Will send in increased dose of Lasix 20 mg BID and have patient come in on Monday BMET and BNP. Patient verbalized understanding and agreed to plan.

## 2019-09-29 NOTE — Telephone Encounter (Signed)
Have him take lasix bid and check BMET and BNP Monday

## 2019-10-02 ENCOUNTER — Other Ambulatory Visit: Payer: Self-pay

## 2019-10-02 ENCOUNTER — Other Ambulatory Visit: Payer: Medicare Other | Admitting: *Deleted

## 2019-10-02 ENCOUNTER — Other Ambulatory Visit: Payer: Self-pay | Admitting: *Deleted

## 2019-10-02 ENCOUNTER — Ambulatory Visit: Payer: Medicare Other | Admitting: Cardiovascular Disease

## 2019-10-02 DIAGNOSIS — R600 Localized edema: Secondary | ICD-10-CM

## 2019-10-02 DIAGNOSIS — R0602 Shortness of breath: Secondary | ICD-10-CM | POA: Diagnosis not present

## 2019-10-02 NOTE — Patient Outreach (Signed)
Red EMMI referral received to address following questions- Lost interest, sad, hopeless and has other questions. Outreach call to pt,  No answer to telephone, left voicemail requesting return phone call, mailed unsuccessful outreach letter to pt home.    PLAN Outreach pt in 3-4 business days  Jacqlyn Larsen [facility], [place] [phone]

## 2019-10-03 LAB — BASIC METABOLIC PANEL
BUN/Creatinine Ratio: 16 (ref 10–24)
BUN: 17 mg/dL (ref 8–27)
CO2: 25 mmol/L (ref 20–29)
Calcium: 8.8 mg/dL (ref 8.6–10.2)
Chloride: 96 mmol/L (ref 96–106)
Creatinine, Ser: 1.06 mg/dL (ref 0.76–1.27)
GFR calc Af Amer: 80 mL/min/{1.73_m2} (ref >59)
GFR calc non Af Amer: 69 mL/min/{1.73_m2} (ref >59)
Glucose: 207 mg/dL — ABNORMAL HIGH (ref 65–99)
Potassium: 4.2 mmol/L (ref 3.5–5.2)
Sodium: 140 mmol/L (ref 134–144)

## 2019-10-03 LAB — PRO B NATRIURETIC PEPTIDE: NT-Pro BNP: 3116 pg/mL — ABNORMAL HIGH (ref 0–376)

## 2019-10-04 DIAGNOSIS — H43813 Vitreous degeneration, bilateral: Secondary | ICD-10-CM | POA: Diagnosis not present

## 2019-10-04 DIAGNOSIS — H15833 Staphyloma posticum, bilateral: Secondary | ICD-10-CM | POA: Diagnosis not present

## 2019-10-04 DIAGNOSIS — H33311 Horseshoe tear of retina without detachment, right eye: Secondary | ICD-10-CM | POA: Diagnosis not present

## 2019-10-04 DIAGNOSIS — H353112 Nonexudative age-related macular degeneration, right eye, intermediate dry stage: Secondary | ICD-10-CM | POA: Diagnosis not present

## 2019-10-06 ENCOUNTER — Emergency Department (HOSPITAL_COMMUNITY): Payer: Medicare Other

## 2019-10-06 ENCOUNTER — Inpatient Hospital Stay (HOSPITAL_COMMUNITY)
Admission: EM | Admit: 2019-10-06 | Discharge: 2019-10-17 | DRG: 246 | Disposition: A | Discharge status: Home / Self Care | Payer: Medicare Other | Attending: Cardiology | Admitting: Cardiology

## 2019-10-06 DIAGNOSIS — I371 Nonrheumatic pulmonary valve insufficiency: Secondary | ICD-10-CM | POA: Diagnosis not present

## 2019-10-06 DIAGNOSIS — I214 Non-ST elevation (NSTEMI) myocardial infarction: Secondary | ICD-10-CM | POA: Diagnosis not present

## 2019-10-06 DIAGNOSIS — I480 Paroxysmal atrial fibrillation: Secondary | ICD-10-CM | POA: Diagnosis not present

## 2019-10-06 DIAGNOSIS — I4819 Other persistent atrial fibrillation: Secondary | ICD-10-CM | POA: Diagnosis not present

## 2019-10-06 DIAGNOSIS — N141 Nephropathy induced by other drugs, medicaments and biological substances: Secondary | ICD-10-CM | POA: Diagnosis present

## 2019-10-06 DIAGNOSIS — I472 Ventricular tachycardia: Secondary | ICD-10-CM | POA: Diagnosis present

## 2019-10-06 DIAGNOSIS — I4892 Unspecified atrial flutter: Secondary | ICD-10-CM | POA: Diagnosis not present

## 2019-10-06 DIAGNOSIS — E785 Hyperlipidemia, unspecified: Secondary | ICD-10-CM | POA: Diagnosis present

## 2019-10-06 DIAGNOSIS — I509 Heart failure, unspecified: Secondary | ICD-10-CM

## 2019-10-06 DIAGNOSIS — I25110 Atherosclerotic heart disease of native coronary artery with unstable angina pectoris: Secondary | ICD-10-CM | POA: Diagnosis present

## 2019-10-06 DIAGNOSIS — R791 Abnormal coagulation profile: Secondary | ICD-10-CM | POA: Diagnosis not present

## 2019-10-06 DIAGNOSIS — I499 Cardiac arrhythmia, unspecified: Secondary | ICD-10-CM | POA: Diagnosis not present

## 2019-10-06 DIAGNOSIS — E876 Hypokalemia: Secondary | ICD-10-CM | POA: Diagnosis present

## 2019-10-06 DIAGNOSIS — I5043 Acute on chronic combined systolic (congestive) and diastolic (congestive) heart failure: Secondary | ICD-10-CM | POA: Diagnosis not present

## 2019-10-06 DIAGNOSIS — Z951 Presence of aortocoronary bypass graft: Secondary | ICD-10-CM

## 2019-10-06 DIAGNOSIS — N170 Acute kidney failure with tubular necrosis: Secondary | ICD-10-CM | POA: Diagnosis present

## 2019-10-06 DIAGNOSIS — Z20828 Contact with and (suspected) exposure to other viral communicable diseases: Secondary | ICD-10-CM | POA: Diagnosis present

## 2019-10-06 DIAGNOSIS — R570 Cardiogenic shock: Secondary | ICD-10-CM | POA: Diagnosis not present

## 2019-10-06 DIAGNOSIS — Z952 Presence of prosthetic heart valve: Secondary | ICD-10-CM

## 2019-10-06 DIAGNOSIS — Z95820 Peripheral vascular angioplasty status with implants and grafts: Secondary | ICD-10-CM

## 2019-10-06 DIAGNOSIS — I4891 Unspecified atrial fibrillation: Secondary | ICD-10-CM | POA: Diagnosis present

## 2019-10-06 DIAGNOSIS — I110 Hypertensive heart disease with heart failure: Secondary | ICD-10-CM | POA: Diagnosis present

## 2019-10-06 DIAGNOSIS — Z8673 Personal history of transient ischemic attack (TIA), and cerebral infarction without residual deficits: Secondary | ICD-10-CM

## 2019-10-06 DIAGNOSIS — R079 Chest pain, unspecified: Secondary | ICD-10-CM | POA: Diagnosis not present

## 2019-10-06 DIAGNOSIS — K7200 Acute and subacute hepatic failure without coma: Secondary | ICD-10-CM

## 2019-10-06 DIAGNOSIS — I440 Atrioventricular block, first degree: Secondary | ICD-10-CM | POA: Diagnosis not present

## 2019-10-06 DIAGNOSIS — Z7901 Long term (current) use of anticoagulants: Secondary | ICD-10-CM

## 2019-10-06 DIAGNOSIS — Z9842 Cataract extraction status, left eye: Secondary | ICD-10-CM | POA: Diagnosis not present

## 2019-10-06 DIAGNOSIS — Z79899 Other long term (current) drug therapy: Secondary | ICD-10-CM

## 2019-10-06 DIAGNOSIS — I255 Ischemic cardiomyopathy: Secondary | ICD-10-CM | POA: Diagnosis not present

## 2019-10-06 DIAGNOSIS — I493 Ventricular premature depolarization: Secondary | ICD-10-CM | POA: Diagnosis present

## 2019-10-06 DIAGNOSIS — R008 Other abnormalities of heart beat: Secondary | ICD-10-CM | POA: Diagnosis present

## 2019-10-06 DIAGNOSIS — K7290 Hepatic failure, unspecified without coma: Secondary | ICD-10-CM | POA: Diagnosis present

## 2019-10-06 DIAGNOSIS — I701 Atherosclerosis of renal artery: Secondary | ICD-10-CM | POA: Diagnosis present

## 2019-10-06 DIAGNOSIS — I5041 Acute combined systolic (congestive) and diastolic (congestive) heart failure: Secondary | ICD-10-CM | POA: Diagnosis not present

## 2019-10-06 DIAGNOSIS — Z961 Presence of intraocular lens: Secondary | ICD-10-CM | POA: Diagnosis present

## 2019-10-06 DIAGNOSIS — I5082 Biventricular heart failure: Secondary | ICD-10-CM | POA: Diagnosis present

## 2019-10-06 DIAGNOSIS — Z9081 Acquired absence of spleen: Secondary | ICD-10-CM | POA: Diagnosis not present

## 2019-10-06 DIAGNOSIS — Z8249 Family history of ischemic heart disease and other diseases of the circulatory system: Secondary | ICD-10-CM

## 2019-10-06 DIAGNOSIS — E875 Hyperkalemia: Secondary | ICD-10-CM | POA: Diagnosis not present

## 2019-10-06 DIAGNOSIS — I959 Hypotension, unspecified: Secondary | ICD-10-CM | POA: Diagnosis present

## 2019-10-06 DIAGNOSIS — I739 Peripheral vascular disease, unspecified: Secondary | ICD-10-CM

## 2019-10-06 DIAGNOSIS — F17290 Nicotine dependence, other tobacco product, uncomplicated: Secondary | ICD-10-CM | POA: Diagnosis present

## 2019-10-06 DIAGNOSIS — Z955 Presence of coronary angioplasty implant and graft: Secondary | ICD-10-CM

## 2019-10-06 DIAGNOSIS — Z86010 Personal history of colonic polyps: Secondary | ICD-10-CM | POA: Diagnosis not present

## 2019-10-06 DIAGNOSIS — I340 Nonrheumatic mitral (valve) insufficiency: Secondary | ICD-10-CM | POA: Diagnosis not present

## 2019-10-06 DIAGNOSIS — T508X5A Adverse effect of diagnostic agents, initial encounter: Secondary | ICD-10-CM | POA: Diagnosis present

## 2019-10-06 DIAGNOSIS — R0602 Shortness of breath: Secondary | ICD-10-CM | POA: Diagnosis not present

## 2019-10-06 DIAGNOSIS — I2510 Atherosclerotic heart disease of native coronary artery without angina pectoris: Secondary | ICD-10-CM | POA: Diagnosis not present

## 2019-10-06 DIAGNOSIS — Z7902 Long term (current) use of antithrombotics/antiplatelets: Secondary | ICD-10-CM

## 2019-10-06 DIAGNOSIS — Z833 Family history of diabetes mellitus: Secondary | ICD-10-CM

## 2019-10-06 DIAGNOSIS — E1151 Type 2 diabetes mellitus with diabetic peripheral angiopathy without gangrene: Secondary | ICD-10-CM | POA: Diagnosis not present

## 2019-10-06 DIAGNOSIS — I491 Atrial premature depolarization: Secondary | ICD-10-CM | POA: Diagnosis not present

## 2019-10-06 DIAGNOSIS — I5021 Acute systolic (congestive) heart failure: Secondary | ICD-10-CM | POA: Diagnosis not present

## 2019-10-06 DIAGNOSIS — Z7982 Long term (current) use of aspirin: Secondary | ICD-10-CM

## 2019-10-06 DIAGNOSIS — Z9841 Cataract extraction status, right eye: Secondary | ICD-10-CM | POA: Diagnosis not present

## 2019-10-06 DIAGNOSIS — Z803 Family history of malignant neoplasm of breast: Secondary | ICD-10-CM

## 2019-10-06 DIAGNOSIS — F17210 Nicotine dependence, cigarettes, uncomplicated: Secondary | ICD-10-CM | POA: Diagnosis present

## 2019-10-06 DIAGNOSIS — Z209 Contact with and (suspected) exposure to unspecified communicable disease: Secondary | ICD-10-CM | POA: Diagnosis not present

## 2019-10-06 DIAGNOSIS — N179 Acute kidney failure, unspecified: Secondary | ICD-10-CM

## 2019-10-06 DIAGNOSIS — I4430 Unspecified atrioventricular block: Secondary | ICD-10-CM | POA: Diagnosis not present

## 2019-10-06 LAB — COMPREHENSIVE METABOLIC PANEL
ALT: 796 U/L — ABNORMAL HIGH (ref 0–44)
AST: 772 U/L — ABNORMAL HIGH (ref 15–41)
Albumin: 3.5 g/dL (ref 3.5–5.0)
Alkaline Phosphatase: 81 U/L (ref 38–126)
Anion gap: 22 — ABNORMAL HIGH (ref 5–15)
BUN: 35 mg/dL — ABNORMAL HIGH (ref 8–23)
CO2: 17 mmol/L — ABNORMAL LOW (ref 22–32)
Calcium: 9.1 mg/dL (ref 8.9–10.3)
Chloride: 95 mmol/L — ABNORMAL LOW (ref 98–111)
Creatinine, Ser: 1.64 mg/dL — ABNORMAL HIGH (ref 0.61–1.24)
GFR calc Af Amer: 47 mL/min — ABNORMAL LOW (ref >60)
GFR calc non Af Amer: 41 mL/min — ABNORMAL LOW (ref >60)
Glucose, Bld: 306 mg/dL — ABNORMAL HIGH (ref 70–99)
Potassium: 4.6 mmol/L (ref 3.5–5.1)
Sodium: 134 mmol/L — ABNORMAL LOW (ref 135–145)
Total Bilirubin: 1.2 mg/dL (ref 0.3–1.2)
Total Protein: 6.2 g/dL — ABNORMAL LOW (ref 6.5–8.1)

## 2019-10-06 LAB — MAGNESIUM: Magnesium: 1.9 mg/dL (ref 1.7–2.4)

## 2019-10-06 LAB — CBC
HCT: 32.8 % — ABNORMAL LOW (ref 39.0–52.0)
Hemoglobin: 9.7 g/dL — ABNORMAL LOW (ref 13.0–17.0)
MCH: 23.9 pg — ABNORMAL LOW (ref 26.0–34.0)
MCHC: 29.6 g/dL — ABNORMAL LOW (ref 30.0–36.0)
MCV: 80.8 fL (ref 80.0–100.0)
Platelets: 284 10*3/uL (ref 150–400)
RBC: 4.06 MIL/uL — ABNORMAL LOW (ref 4.22–5.81)
RDW: 24.3 % — ABNORMAL HIGH (ref 11.5–15.5)
WBC: 7.7 10*3/uL (ref 4.0–10.5)
nRBC: 9.7 % — ABNORMAL HIGH (ref 0.0–0.2)

## 2019-10-06 LAB — TROPONIN I (HIGH SENSITIVITY)
Troponin I (High Sensitivity): 389 ng/L (ref <18)
Troponin I (High Sensitivity): 644 ng/L (ref <18)
Troponin I (High Sensitivity): 919 ng/L (ref <18)
Troponin I (High Sensitivity): 935 ng/L (ref <18)

## 2019-10-06 LAB — BRAIN NATRIURETIC PEPTIDE: B Natriuretic Peptide: 899.1 pg/mL — ABNORMAL HIGH (ref 0.0–100.0)

## 2019-10-06 LAB — TSH: TSH: 2.798 u[IU]/mL (ref 0.350–4.500)

## 2019-10-06 LAB — MRSA PCR SCREENING: MRSA by PCR: NEGATIVE

## 2019-10-06 LAB — PROTIME-INR
INR: 8 (ref 0.8–1.2)
Prothrombin Time: 65.3 seconds — ABNORMAL HIGH (ref 11.4–15.2)

## 2019-10-06 LAB — ECHOCARDIOGRAM COMPLETE

## 2019-10-06 MED ORDER — AMIODARONE LOAD VIA INFUSION
150.0000 mg | Freq: Once | INTRAVENOUS | Status: AC
  Administered 2019-10-06: 150 mg via INTRAVENOUS
  Filled 2019-10-06: qty 83.34

## 2019-10-06 MED ORDER — FUROSEMIDE 10 MG/ML IJ SOLN
60.0000 mg | Freq: Two times a day (BID) | INTRAMUSCULAR | Status: DC
  Administered 2019-10-06 – 2019-10-09 (×6): 60 mg via INTRAVENOUS
  Filled 2019-10-06 (×6): qty 6

## 2019-10-06 MED ORDER — SODIUM CHLORIDE 0.9 % IV BOLUS
500.0000 mL | Freq: Once | INTRAVENOUS | Status: AC
  Administered 2019-10-06: 500 mL via INTRAVENOUS

## 2019-10-06 MED ORDER — PANTOPRAZOLE SODIUM 40 MG PO TBEC
40.0000 mg | DELAYED_RELEASE_TABLET | Freq: Every day | ORAL | Status: DC
  Administered 2019-10-06 – 2019-10-17 (×12): 40 mg via ORAL
  Filled 2019-10-06 (×12): qty 1

## 2019-10-06 MED ORDER — AMIODARONE HCL IN DEXTROSE 360-4.14 MG/200ML-% IV SOLN
30.0000 mg/h | INTRAVENOUS | Status: DC
  Administered 2019-10-07 – 2019-10-17 (×20): 30 mg/h via INTRAVENOUS
  Filled 2019-10-06 (×23): qty 200

## 2019-10-06 MED ORDER — ACETAMINOPHEN 325 MG PO TABS
650.0000 mg | ORAL_TABLET | ORAL | Status: DC | PRN
  Administered 2019-10-12: 15:00:00 650 mg via ORAL
  Filled 2019-10-06: qty 2

## 2019-10-06 MED ORDER — NITROGLYCERIN 0.4 MG SL SUBL: 0.4000 mg | SUBLINGUAL_TABLET | SUBLINGUAL | Status: DC | PRN

## 2019-10-06 MED ORDER — ATORVASTATIN CALCIUM 80 MG PO TABS
80.0000 mg | ORAL_TABLET | Freq: Every day | ORAL | Status: DC
  Administered 2019-10-06 – 2019-10-17 (×12): 80 mg via ORAL
  Filled 2019-10-06 (×12): qty 1

## 2019-10-06 MED ORDER — ASPIRIN 81 MG PO CHEW
324.0000 mg | CHEWABLE_TABLET | Freq: Once | ORAL | Status: AC
  Administered 2019-10-06: 324 mg via ORAL
  Filled 2019-10-06: qty 4

## 2019-10-06 MED ORDER — ASPIRIN EC 81 MG PO TBEC
81.0000 mg | DELAYED_RELEASE_TABLET | Freq: Every day | ORAL | Status: DC
  Administered 2019-10-07 – 2019-10-17 (×11): 81 mg via ORAL
  Filled 2019-10-06 (×11): qty 1

## 2019-10-06 MED ORDER — VITAMIN K1 10 MG/ML IJ SOLN
5.0000 mg | Freq: Once | INTRAVENOUS | Status: AC
  Administered 2019-10-06: 5 mg via INTRAVENOUS
  Filled 2019-10-06: qty 0.5

## 2019-10-06 MED ORDER — SODIUM CHLORIDE 0.9% FLUSH: 3.0000 mL | Freq: Once | INTRAVENOUS | Status: DC

## 2019-10-06 MED ORDER — ASPIRIN 81 MG PO TBEC: 81.0000 mg | DELAYED_RELEASE_TABLET | Freq: Every day | ORAL | Status: DC

## 2019-10-06 MED ORDER — ONDANSETRON HCL 4 MG/2ML IJ SOLN: 4.0000 mg | Freq: Four times a day (QID) | INTRAMUSCULAR | Status: DC | PRN

## 2019-10-06 MED ORDER — AMIODARONE HCL IN DEXTROSE 360-4.14 MG/200ML-% IV SOLN
60.0000 mg/h | INTRAVENOUS | Status: AC
  Administered 2019-10-06 (×2): 60 mg/h via INTRAVENOUS
  Filled 2019-10-06 (×2): qty 200

## 2019-10-06 MED ORDER — ISOSORBIDE MONONITRATE ER 30 MG PO TB24
30.0000 mg | ORAL_TABLET | Freq: Every day | ORAL | Status: DC
  Administered 2019-10-07: 30 mg via ORAL
  Filled 2019-10-06: qty 1

## 2019-10-06 NOTE — H&P (Addendum)
Cardiology History and Physical:   Patient ID: [name] [name] MRN: [id number]; DOB: 1946-05-10   Admission date: 10/06/2019  Primary Care Provider: Shon Baton, MD Primary Cardiologist: Jenkins Rouge, MD  Primary Electrophysiologist:  None   Chief Complaint:  Chest pain, CHF  Patient Profile:   [name] [name] is a 73 y.o. male with a history of CAD s/p CABG, mechanical AVR on coumadin, extensive PVD, DM, HTN, HLD, ITP, TIA, and recurrent GI bleed. Note, requires lovenox bridging off heparin.   History of Present Illness:   [name] [name] is s/p CABG with mechanical AVR in 2004. He had a GI bleed 02/23/19 in the setting of of INR 7.5.  He also has a history of stroke in the setting of subtherapeutic INR, no residual effects.   Dr. Gwenlyn Found follows him for extensive PAD. He has had an aortobifem bypass in 2016 with follow up iliac stenting and femoral endarterectomy. In 2017, he had subsequent ostial and mid right SFA intervention. Hx of left renal artery stenting in 2016 with repeat intervention on left for 75% in-stent restenosis, with progression of disease on the right renal artery showing 60% stenosis on duplex 07/12/19. Renal function and blood pressure have been stable prior to this admission. Echo 09/17/19 with normal EF of 60-65% and normal AVR function; chronic diastolic heart failure.   He was recently admitted to our service for chest pain. He underwent heart cath 09/18/19 that showed 99% left main stenosis and patent LIMA to occluded LAD and a patent sequential vein to the PDA and PLA of an occluded dominant right. His left main stenosis jeopardizes the moderately large ramus and high first marginal and nondominant Cx which has 90% mid AV groove Cx stenosis. Aggressive medical therapy was recommended at the time. Dr. Gwenlyn Found noted that if he failed medical therapy, he would need orbital atherectomy and stenting of left main. It was noted this would jeopardize his entire lateral  wall without the ability to place and impella or balloon pump due to mechanical AVR and extensive PAD. He was discharged on 09/19/19 with norvasc, imdur, and toprol. He was seen in follow up with Dr. Johnsie Cancel on 09/22/19 and was doing well at that time, but he had not been active at all yet. He called the office on 09/29/19 complaining of ankle swelling, chest tightness with activity, and orthopnea. Per Dr. Johnsie Cancel, he was instructed to increase lasix to 20 mg BID with follow up at [facility] and BNP. Unfortunately, he presented to [facility] today with complaints of chest pain, SOB, LE swelling, and orthopnea.   ED course: INR 8.0, Hb 9.7 HS troponin 389 --> 644 CXR with bilateral pleural effusions suggestive of CHF BNP 900 EKG with sinus rhythm, ST depression and TWI inferior and latera leads  Telemetry with significant ectopy - ventricular bigeminy and frequent runs of NSVT K 4.6 Mg pending  sCr 1.64 (1.06) AST 772 ALT 796 Alk phos normal  Cardiology was consulted for the above by Dr. Tyrone Nine.   On my interview, the patient states since discharge, he has gotten progressively worse in terms of chest pain. As of yesterday, he could only walk 1-2 steps before getting severe chest pressure. He is having associated shortness of breath, diaphoresis, and lower extremity edema. He is unable to sleep in his bed and must rest in a recliner. He is generally active and fit, this is a significant decline in his status. With ectopy on monitor, he becomes lightheaded. Surprisingly, he is not hypotensive.  Heart Pathway Score:     Past Medical History:  Diagnosis Date   Adenomatous colon polyp 09/1997   Anemia    Aortic stenosis    s/p st. jud[facility] mechanical AVR - Chronic Coumadin   Blood transfusion    "related to ITP"   Coronary artery disease    s/p cabg x 3 11/2003: lima-lad, seq vg to rpda and rpl   Diverticulitis of colon    Heart murmur    Hyperlipidemia    Hypertension    ITP (idiopathic  thrombocytopenic purpura)    Peripheral arterial disease ([place])    a. history of aortobifemoral bypass grafting by Dr. Sherren Mocha early b. LE angiography 04/22/2015 patent aortobifem graft, DES to R SFA   Peripheral vascular disease ([place])    s/p Left external Iliac Artery stenting and subsequent left femoral endarterectomy 02/2011- post op course complicated by wound infxn req I&D 03/2011   Renal artery stenosis, native, bilateral ([place])    a. bilateral renal artery stenosis by recent duplex ultrasound b. L renal artery stent 02/2015, R renal artery patent on angiogram   Stroke [facility])    TIA (transient ischemic attack) ~ 2013   Type II diabetes mellitus ([place])     Past Surgical History:  Procedure Laterality Date   ABDOMINAL AORTAGRAM N/A 12/16/2011   Procedure: ABDOMINAL Maxcine Ham;  Surgeon: Sherren Mocha, MD;  Location: [facility] CATH LAB;  Service: Cardiovascular;  Laterality: N/A;   ANGIOPLASTY / STENTING ILIAC     Left external Iliac Artery   AORTA - BILATERAL FEMORAL ARTERY BYPASS GRAFT  01/18/2012   Procedure: AORTA BIFEMORAL BYPASS GRAFT;  Surgeon: Curt Jews, MD;  Location: [place];  Service: Vascular;  Laterality: N/A;   AORTIC VALVE REPLACEMENT  ~ 2004   CARDIAC CATHETERIZATION  11/2003   /pt report 10/01/2016   CARDIAC VALVE REPLACEMENT  11/2003   aortic   CATARACT EXTRACTION W/ INTRAOCULAR LENS  IMPLANT, BILATERAL Bilateral    COLONOSCOPY     COLONOSCOPY WITH PROPOFOL N/A 09/03/2019   Procedure: COLONOSCOPY WITH PROPOFOL;  Surgeon: Doran Stabler, MD;  Location: [facility] ENDOSCOPY;  Service: Gastroenterology;  Laterality: N/A;   CORONARY ARTERY BYPASS GRAFT  11/2003   Archie Endo 04/21/2011   CORONARY/GRAFT ANGIOGRAPHY N/A 09/18/2019   Procedure: CORONARY/GRAFT ANGIOGRAPHY;  Surgeon: Lorretta Harp, MD;  Location: [place] CV LAB;  Service: Cardiovascular;  Laterality: N/A;   ESOPHAGOGASTRODUODENOSCOPY (EGD) WITH PROPOFOL N/A 02/27/2019   Procedure: ESOPHAGOGASTRODUODENOSCOPY  (EGD) WITH PROPOFOL;  Surgeon: Doran Stabler, MD;  Location: [place];  Service: Endoscopy;  Laterality: N/A;   HEMOSTASIS CLIP PLACEMENT  09/03/2019   Procedure: HEMOSTASIS CLIP PLACEMENT;  Surgeon: Doran Stabler, MD;  Location: [facility];  Service: Gastroenterology;;   LOWER EXTREMITY ANGIOGRAM N/A 02/21/2015   Procedure: LOWER EXTREMITY ANGIOGRAM;  Surgeon: Lorretta Harp, MD;  Location: [facility] CATH LAB;  Service: Cardiovascular;  Laterality: N/A;   PERIPHERAL VASCULAR CATHETERIZATION N/A 04/22/2015   Procedure: Lower Extremity Angiography;  Surgeon: Lorretta Harp, MD;  Location: [place] CV LAB;  Service: Cardiovascular;  Laterality: N/A;   PERIPHERAL VASCULAR CATHETERIZATION N/A 08/24/2016   Procedure: Lower Extremity Angiography;  Surgeon: Lorretta Harp, MD;  Location: [place] CV LAB;  Service: Cardiovascular;  Laterality: N/A;   PERIPHERAL VASCULAR CATHETERIZATION Right 10/01/2016   Procedure: Peripheral Vascular Intervention - STENT;  Surgeon: Lorretta Harp, MD;  Location: [place] CV LAB;  Service: Cardiovascular;  Laterality: Right;  Prox and MID SFA  POLYPECTOMY     RENAL ANGIOGRAM N/A 02/21/2015   Procedure: RENAL ANGIOGRAM;  Surgeon: Lorretta Harp, MD;  Location: [facility] CATH LAB;  Service: Cardiovascular;  Laterality: Bilateral; 6 mm x 12 mm long Herculink balloon expandable stent to the left renal artery   RENAL ARTERY STENT Left 04/22/2015   dr berry   SPLENECTOMY  02/2003   Archie Endo 04/21/2011   TONSILLECTOMY  ~ 1952     Medications Prior to Admission: Prior to Admission medications   Medication Sig Start Date End Date Taking? Authorizing Provider  amLODipine (NORVASC) 10 MG tablet Take 10 mg by mouth daily. 02/11/15  Yes [provider]  aspirin EC 81 MG EC tablet Take 1 tablet (81 mg total) by mouth daily. Patient taking differently: Take 324 mg by mouth as needed.  09/20/19 09/19/20 Yes Daune Perch, NP  atorvastatin  (LIPITOR) 80 MG tablet Take 80 mg by mouth daily.    Yes [provider]  canagliflozin (INVOKANA) 300 MG TABS tablet Take 300 mg by mouth daily.    Yes [provider]  enoxaparin (LOVENOX) 80 MG/0.8ML injection Inject 0.8 mLs (80 mg total) into the skin every 12 (twelve) hours. 09/19/19  Yes Daune Perch, NP  Evolocumab (REPATHA SURECLICK) 597 MG/ML SOAJ Inject 140 mg into the skin every 14 (fourteen) days.   Yes [provider]  furosemide (LASIX) 20 MG tablet Take 1 tablet (20 mg total) by mouth 2 (two) times daily. 09/29/19  Yes Josue Hector, MD  glyBURIDE (DIABETA) 2.5 MG tablet Take 5 mg by mouth 2 (two) times daily with a meal.    Yes [provider]  isosorbide mononitrate (IMDUR) 30 MG 24 hr tablet Take 1 tablet (30 mg total) by mouth daily. 09/20/19 09/19/20 Yes Daune Perch, NP  metoprolol succinate (TOPROL-XL) 100 MG 24 hr tablet Take 1 tablet (100 mg total) by mouth daily. Take with or immediately following a meal. 09/20/19 09/19/20 Yes Daune Perch, NP  nitroGLYCERIN (NITROSTAT) 0.4 MG SL tablet Place 1 tablet (0.4 mg total) under the tongue every 5 (five) minutes x 3 doses as needed for chest pain. 09/19/19  Yes Daune Perch, NP  pantoprazole (PROTONIX) 40 MG tablet Take 40 mg by mouth daily. 09/22/19  Yes [provider]  trandolapril (MAVIK) 1 MG tablet Take 1 tablet (1 mg total) by mouth daily. 09/20/19 09/19/20 Yes Daune Perch, NP  warfarin (COUMADIN) 10 MG tablet Take 5 mg on Mon/Wed/Fri and 10 mg on Tue/Thur/Sat/Sun. Patient taking differently: Take 5-10 mg by mouth See admin instructions. Take 5 mg on Mon/Wed/Fri/Sat and 10 mg on Tue/Thur/Sun. 09/19/19  Yes Daune Perch, NP     Allergies:   No Known Allergies  Social History:   Social History   Socioeconomic History   Marital status: Divorced    Spouse name: Not on file   Number of children: 3   Years of education: Not on file   Highest education  level: Not on file  Occupational History   Occupation: Retired  Scientist, product/process development strain: Not on file   Food insecurity    Worry: Not on file    Inability: Not on Lexicographer needs    Medical: Not on file    Non-medical: Not on file  Tobacco Use   Smoking status: Light Tobacco Smoker    Years: 30.00    Types: Cigarettes, Cigars    Last attempt to quit: 12/15/1993    Years since  quitting: 25.8   Smokeless tobacco: Never Used   Tobacco comment: occasional cigar  Substance and Sexual Activity   Alcohol use: Yes    Alcohol/week: 16.0 standard drinks    Types: 1 Cans of beer, 1 Shots of liquor, 14 Standard drinks or equivalent per week    Comment: drinks 2 martini's a night (2 shots in each)   Drug use: No   Sexual activity: Yes  Lifestyle   Physical activity    Days per week: Not on file    Minutes per session: Not on file   Stress: Not on file  Relationships   Social connections    Talks on phone: Not on file    Gets together: Not on file    Attends religious service: Not on file    Active member of club or organization: Not on file    Attends meetings of clubs or organizations: Not on file    Relationship status: Not on file   Intimate partner violence    Fear of current or ex partner: Not on file    Emotionally abused: Not on file    Physically abused: Not on file    Forced sexual activity: Not on file  Other Topics Concern   Not on file  Social History Narrative   Tries to remain active.  Frequent golfer but claudication limits this.    Family History:   The patient's family history includes Breast cancer in his sister; Coronary artery disease in his mother; Diabetes in his paternal aunt, paternal grandmother, and another family member; Heart disease in his father. There is no history of Colon cancer, Colon polyps, Esophageal cancer, Rectal cancer, or Stomach cancer.    ROS:  Please see the history of present illness.  All  other ROS reviewed and negative.     Physical Exam/Data:   Vitals:   10/06/19 1300 10/06/19 1330 10/06/19 1350 10/06/19 1352  BP: 131/61 (!) 166/93 (!) 143/45   Pulse: 72 73 71 70  Resp: _0 Temp:      SpO2: 92% 94% 93% 94%    Intake/Output Summary (Last 24 hours) at 10/06/2019 1415 Last data filed at 10/06/2019 1000 Gross per 24 hour  Intake 100 ml  Output --  Net 100 ml   Last 3 Weights 09/19/2019 09/18/2019 09/17/2019  Weight (lbs) 170 lb 1.6 oz 168 lb 1.6 oz 168 lb 3.2 oz  Weight (kg) 77.157 kg 76.25 kg 76.295 kg     There is no height or weight on file to calculate BMI.  General:  Well nourished, well developed, in no acute distress HEENT: normal Lymph: no adenopathy Neck: + JVD Vascular: No carotid bruits  Cardiac:  Irregular rhythm regular rate + click Lungs:  Respirations unlabored, diminished B bases Abd: soft, nontender, no hepatomegaly  Ext:  3+ B LE edema Musculoskeletal:  No deformities, BUE and BLE strength normal and equal Skin: warm and dry  Neuro:  CNs 2-12 intact, no focal abnormalities noted Psych:  Normal affect    EKG:  The ECG that was done was personally reviewed and demonstrates sinus rhythm ST depression and TWI inferior and lateral leads  Relevant CV Studies:  CORONARY/GRAFT ANGIOGRAPHY 09/19/2019  Conclusion    Ost RCA to Prox RCA lesion is 100% stenosed.  Ost LM to Mid LM lesion is 99% stenosed.  Dist LM to Prox LAD lesion is 100% stenosed.  Mid Cx lesion is 90% stenosed.   IMPRESSION:Mr. Villard has a  patent LIMA to admit occluded LAD and a patent sequential vein to the PDA and PLA of an occluded dominant right. He is current 99% calcified left main jeopardizing a moderately large ramus, high first marginal and nondominant circumflex which has a 90% mid AV groove circumflex stenosis. I believe at this point aggressive medical therapy should be pursued. If he fails medical therapy he would need orbital atherectomy  and stenting of his left main. In addition, I did demonstrate high-grade in-stent restenosis within the proximal right SFA stent corresponding to his recent Doppler study probably contributing to his right lower extremity claudication. I will address this in the future. The sheath will be removed and pressure held. I will restart heparin 4 hours without a bolus and Coumadin can then be restarted for his mechanical aortic valve. He left lab in stable condition.  Quay Burow. MD, [facility]   Echocardiogram 09/17/2019 IMPRESSIONS  1. Left ventricular ejection fraction, by visual estimation, is 50 to 55%. The left ventricle has normal function. Normal left ventricular size. There is mildly increased left ventricular hypertrophy. 2. Left ventricular diastolic Doppler parameters are consistent with pseudonormalization pattern of LV diastolic filling. 3. Global right ventricle has normal systolic function.The right ventricular size is normal. 4. Left atrial size was normal. 5. Right atrial size was normal. 6. Mild mitral annular calcification. 7. The mitral valve is normal in structure. Trace mitral valve regurgitation. No evidence of mitral stenosis. 8. The tricuspid valve is normal in structure. Tricuspid valve regurgitation is trivial. 9. Aortic valve regurgitation was not visualized by color flow Doppler. 10. The pulmonic valve was normal in structure. Pulmonic valve regurgitation is not visualized by color flow Doppler. 11. The inferior vena cava is normal in size with greater than 50% respiratory variability, suggesting right atrial pressure of 3 mmHg. 12. Septal hypokinesis with overall preserved LV function; grade 2 diastolic dysfunction; mild LVH; s/p AVR with mean gradient 13 mmHg and no AI.   Laboratory Data:  High Sensitivity Troponin:   Recent Labs  Lab 09/16/19 1008 09/16/19 1221 09/16/19 1646 09/16/19 1849 10/06/19 0957  TROPONINIHS 242* 225* 265* 232* 389*       Chemistry Recent Labs  Lab 10/02/19 1131 10/06/19 0957  NA 140 134*  K 4.2 4.6  CL 96 95*  CO2 25 17*  GLUCOSE 207* 306*  BUN 17 35*  CREATININE 1.06 1.64*  CALCIUM 8.8 9.1  GFRNONAA 69 41*  GFRAA 80 47*  ANIONGAP  --  22*    Recent Labs  Lab 10/06/19 0957  PROT 6.2*  ALBUMIN 3.5  AST 772*  ALT 796*  ALKPHOS 81  BILITOT 1.2   Hematology Recent Labs  Lab 10/06/19 0957  WBC 7.7  RBC 4.06*  HGB 9.7*  HCT 32.8*  MCV 80.8  MCH 23.9*  MCHC 29.6*  RDW 24.3*  PLT 284   BNP Recent Labs  Lab 10/02/19 1131 10/06/19 0958  BNP  --  899.1*  PROBNP 3,116*  --     DDimer No results for input(s): DDIMER in the last 168 hours.   Radiology/Studies:  Dg Chest Portable 1 View  Result Date: 10/06/2019 CLINICAL DATA:  Shortness of breath. EXAM: PORTABLE CHEST 1 VIEW COMPARISON:  CT 09/16/2019. FINDINGS: Fracture lower sternotomy wire again noted. Prior CABG. Prior cardiac valve replacement. Cardiomegaly. Aortic and peripheral vascular calcification. Mild bibasilar atelectasis. Mild bibasilar interstitial prominence. Small bilateral pleural effusions. No pneumothorax. IMPRESSION: 1. Prior CABG. Prior cardiac valve replacement. Cardiomegaly. Mild bibasilar pulmonary interstitial  prominence and bilateral pleural effusions suggesting mild CHF. 2.  Mild bibasilar atelectasis. Electronically Signed   By: Marcello Moores  Register   On: 10/06/2019 10:22    Assessment and Plan:   1. Chest pain - hs troponin 389 --> 644 - EKG with ST depression inferior and lateral leads - he has known 99% left main disease, patent LIMA-LAD, occluded LAD, and patent sequential vein to PDA and PLA of occluded dominant right - question demand ischemia in the setting of acute congestive heart failure vs ACS    2. Shortness of breath, Lower extremity swelling, orthopnea 3. Chronic diastolic heart failure - last echo with normal EF, estimated 35% on nuc, but with ectopy - BNP elevated to 900 with CXR  showing bilateral pleural effusions - will order stat echo - he appears in acute congestive heart failure - will give 60 mg BID lasix BID - stop amlodipine, ACEI, and BB - continue imdur   4. Supratherapeutic INR - INR is 8.0, Hb is 9.7 - likely secondary to liver failure from right heart failure  - will give 5 mg vitamin K IV now, recheck INR in the AM   5. AKI - elevated creatinine, in the setting of low CO vs progression of renal artery stenosis - hold nephrotoxic agents   6. Liver failure - elevated AST/ALT - likely due right heart failure and responsible for his elevated INR   7. Significant ectopy with PVCs, bigeminy, and runs of NSVT - will start amiodarone drip for ectopy - will run 15- mg bolus over 60 min - will check liver panel in AM   Complicated clinical situation. He appears in multi-organ failure in the setting of left main disease and acute congestive heart failure. Will need to lessen ectopy before addressing his left main disease. He is not currently hypotensive, but may require ionotropes once ectopy is better controlled.    Admit to [place]. Case also discussed with Dr. Aundra Dubin.    Signed, Tami Lin Duke, PA  10/06/2019 2:15 PM    ATTENDING ATTESTATION  I have seen, examined and evaluated the patient this PM in the ER along with Fabian Sharp, PA-C.  After reviewing all the available data and chart, we discussed the patients laboratory, study & physical findings as well as symptoms in detail. I agree with her findings, examination as well as impression recommendations as per our discussion.    Echo now completed: EF now reduced to 30-35%.  Moderately severe dysfunction.  Anteroseptal and anterior wall abnormal.  Elevated filling pressures.  GRII DD.  Mechanical prosthetic aortic valve in place.  Dilated IVC suggesting increased right atrial pressures. ->  New wall motion normalities and significantly reduced EF compared to her most recent echo on  09/17/2019.  Very complicated situation with a gentleman recently discharged from the hospital found to have severe left main and mid circumflex disease with patent LIMA-LAD and SVG to RCA.  Was felt to be too high risk for supported PCI of the left main and was therefore discharged home medical management.  He is also noted to have significant PVCs prior to discharge on October 13.  Was seen 3 days later by Dr. Johnsie Cancel.  Was doing relatively well but had not yet been active.  He states that since that discharge he started noticing edema and chest tightness with any activity.  Also significant orthopnea/angina decubitus type symptoms.  Was told to increase his Lasix to 20 mg twice daily, now presents with significant orthopnea/anginal decubitus  symptoms as well as angina with minimal activity.  He has significant ectopy on exam as well as on the monitor with multiple PVCs and short bursts of VT. He is in multiple system organ failure with elevated LFTs in the 700+ range, elevated troponin likely related to ischemia or heart failure, and worsening T wave inversions to ST depressions (also suggesting ischemia), acute renal insufficiency with creatinine at 1.6 from 1.06, and INR of 8.  All of this would suggest potential acute ischemic event or just anemia from his  ongoing left main lesion that provides the only flow to a relatively significant ramus intermedius and circumflex.  No collateral flow feeds these vessels.  His system suggest again multiorgan failure, echocardiogram just done after I saw him reveals significantly reduced EF which would go along with our concern.  I discussed plan with Dr. Loralie Champagne.  Plan: Admit to CCU given the extent of ectopy and his unstable nature with left main disease.  For now we will start amiodarone to stabilize his rhythm (with the understanding of course that he does have some elevated LFTs, stabilizing his rhythm is vital to avoid VT.  We will bolus with IV Lasix  60 mg twice daily, but may require additional dosing.  Now with reduced EF, he will be monitored by the heart failure team over the weekend, suspect that he may benefit from an inotrope, however this needs to be done judiciously once ectopy is calmed with amiodarone.  Ultimately, we need to discuss the plan going forward to readdress the issue of revascularization versus palliative care.  He is extremely debilitated now, noting angina which is about any activity including lying down.  With reduced EF, a protected left main PCI is still quite high risk.  He was thought to be a poor candidate for percutaneous support with balloon pump or Impella from femoral access.  We could consider discussing with our surgical colleagues the possible placement of an axillary IABP for support during PCI and to stabilize him in the interim --> with mechanical aortic valve, cannot use Impella. Obviously, no invasive procedures can be done with his INR of roughly 8. ->  Give vitamin K today.  May need to start heparin once INR drifts down below 2.5.    Glenetta Hew, M.D., M.S. Interventional Cardiologist   Pager # [phone] Phone # [phone] [address] [place], [place] [address]   For questions or updates, please contact [place] Please consult [email or web address] for contact info under

## 2019-10-06 NOTE — ED Provider Notes (Signed)
[facility] EMERGENCY DEPARTMENT Provider Note   CSN: [id number] Arrival date & time: 10/06/19  8466     History   Chief Complaint Chief Complaint  Patient presents with  . Chest Pain  . Shortness of Breath    HPI [name] [name] is a 73 y.o. male.     73 yo M with a chief complaints of chest pain.  This is been noted upon exertion.  The patient has had issues since September.  Has gotten progressively worse.  Went into the hospital for 4 days and found to have a blockage but was not a candidate for catheterization and was given medical therapy.  Since then the patient's symptoms have gotten worse.  States he can only take about 1 or 2 steps before he has pressure across his chest and feels very short of breath.  Gets diaphoretic with it from time to time.  Has new onset lower extremity edema.   Chest Pain Associated symptoms: diaphoresis and shortness of breath   Associated symptoms: no abdominal pain, no fever, no headache, no palpitations and no vomiting   Shortness of Breath Associated symptoms: chest pain and diaphoresis   Associated symptoms: no abdominal pain, no fever, no headaches, no rash and no vomiting     Past Medical History:  Diagnosis Date  . Adenomatous colon polyp 09/1997  . Anemia   . Aortic stenosis    s/p st. jude mechanical AVR - Chronic Coumadin  . Blood transfusion    "related to ITP"  . Coronary artery disease    s/p cabg x 3 11/2003: lima-lad, seq vg to rpda and rpl  . Diverticulitis of colon   . Heart murmur   . Hyperlipidemia   . Hypertension   . ITP (idiopathic thrombocytopenic purpura)   . Peripheral arterial disease ([place])    a. history of aortobifemoral bypass grafting by Dr. Sherren Mocha early b. LE angiography 04/22/2015 patent aortobifem graft, DES to R SFA  . Peripheral vascular disease ([place])    s/p Left external Iliac Artery stenting and subsequent left femoral endarterectomy 02/2011- post op course complicated by  wound infxn req I&D 03/2011  . Renal artery stenosis, native, bilateral (HCC)    a. bilateral renal artery stenosis by recent duplex ultrasound b. L renal artery stent 02/2015, R renal artery patent on angiogram  . Stroke ([place])   . TIA (transient ischemic attack) ~ 2013  . Type II diabetes mellitus [facility])     Patient Active Problem List   Diagnosis Date Noted  . Acute diastolic heart failure ([place]) 09/19/2019  . History of GI bleed 09/19/2019  . Unstable angina ([place]) 09/17/2019  . NSTEMI (non-ST elevated myocardial infarction) ([place]) 09/16/2019  . H/O colonoscopy with polypectomy   . Hematochezia   . GI bleed 09/02/2019  . Supratherapeutic INR 02/23/2019  . Gastrointestinal hemorrhage with melena 02/23/2019  . Acute blood loss anemia 02/23/2019  . Symptomatic anemia 02/23/2019  . TIA (transient ischemic attack) 12/20/2017  . Stroke-like symptoms 12/14/2017  . Claudication ([place]) 10/01/2016  . Renal artery arteriosclerosis ([place]) 02/22/2015  . Renal artery stenosis ([place]) 02/21/2015  . Renal artery stenosis, native, bilateral ([place]) 02/08/2015  . H/O mechanical aortic valve replacement 07/20/2014  . Encounter for therapeutic drug monitoring 03/13/2014  . Cerebral infarction ([place]) 05/05/2013  . Aortoiliac occlusive disease ([place]) 01/10/2013  . PVD (peripheral vascular disease) with claudication ([place]) 02/02/2012  . Atherosclerosis of native arteries of extremity with intermittent claudication ([place]) 01/12/2012  .  Coronary artery disease   . Aortic stenosis   . Peripheral vascular disease ([place])   . Hypertension   . Hyperlipidemia   . ITP (idiopathic thrombocytopenic purpura)   . Coronary atherosclerosis 10/21/2010  . Transient cerebral ischemia 10/10/2009  . Diabetes ([place]) 10/22/2008  . HYPERCHOLESTEROLEMIA 10/22/2008  . Immune thrombocytopenic purpura ([place]) 10/22/2008  . Essential hypertension 10/22/2008  . Aortic valve disorder 10/22/2008  . PVD (peripheral vascular disease) ([place])  10/22/2008  . DIVERTICULITIS, COLON 10/22/2008  . COLONIC POLYPS, HX OF 10/22/2008    Past Surgical History:  Procedure Laterality Date  . ABDOMINAL AORTAGRAM N/A 12/16/2011   Procedure: ABDOMINAL Maxcine Ham;  Surgeon: Sherren Mocha, MD;  Location: [facility] CATH LAB;  Service: Cardiovascular;  Laterality: N/A;  . ANGIOPLASTY / STENTING ILIAC     Left external Iliac Artery  . AORTA - BILATERAL FEMORAL ARTERY BYPASS GRAFT  01/18/2012   Procedure: AORTA BIFEMORAL BYPASS GRAFT;  Surgeon: Curt Jews, MD;  Location: [place];  Service: Vascular;  Laterality: N/A;  . AORTIC VALVE REPLACEMENT  ~ 2004  . CARDIAC CATHETERIZATION  11/2003   /pt report 10/01/2016  . CARDIAC VALVE REPLACEMENT  11/2003   aortic  . CATARACT EXTRACTION W/ INTRAOCULAR LENS  IMPLANT, BILATERAL Bilateral   . COLONOSCOPY    . COLONOSCOPY WITH PROPOFOL N/A 09/03/2019   Procedure: COLONOSCOPY WITH PROPOFOL;  Surgeon: Doran Stabler, MD;  Location: [facility] ENDOSCOPY;  Service: Gastroenterology;  Laterality: N/A;  . CORONARY ARTERY BYPASS GRAFT  11/2003   Archie Endo 04/21/2011  . CORONARY/GRAFT ANGIOGRAPHY N/A 09/18/2019   Procedure: CORONARY/GRAFT ANGIOGRAPHY;  Surgeon: Lorretta Harp, MD;  Location: [place] CV LAB;  Service: Cardiovascular;  Laterality: N/A;  . ESOPHAGOGASTRODUODENOSCOPY (EGD) WITH PROPOFOL N/A 02/27/2019   Procedure: ESOPHAGOGASTRODUODENOSCOPY (EGD) WITH PROPOFOL;  Surgeon: Doran Stabler, MD;  Location: [place];  Service: Endoscopy;  Laterality: N/A;  . HEMOSTASIS CLIP PLACEMENT  09/03/2019   Procedure: HEMOSTASIS CLIP PLACEMENT;  Surgeon: Doran Stabler, MD;  Location: [facility];  Service: Gastroenterology;;  . LOWER EXTREMITY ANGIOGRAM N/A 02/21/2015   Procedure: LOWER EXTREMITY ANGIOGRAM;  Surgeon: Lorretta Harp, MD;  Location[facility] CATH LAB;  Service: Cardiovascular;  Laterality: N/A;  . PERIPHERAL VASCULAR CATHETERIZATION N/A 04/22/2015   Procedure: Lower Extremity Angiography;  Surgeon: Lorretta Harp, MD;  Location: [place] CV LAB;  Service: Cardiovascular;  Laterality: N/A;  . PERIPHERAL VASCULAR CATHETERIZATION N/A 08/24/2016   Procedure: Lower Extremity Angiography;  Surgeon: Lorretta Harp, MD;  Location: [place] CV LAB;  Service: Cardiovascular;  Laterality: N/A;  . PERIPHERAL VASCULAR CATHETERIZATION Right 10/01/2016   Procedure: Peripheral Vascular Intervention - STENT;  Surgeon: Lorretta Harp, MD;  Location: [place] CV LAB;  Service: Cardiovascular;  Laterality: Right;  Prox and MID SFA   . POLYPECTOMY    . RENAL ANGIOGRAM N/A 02/21/2015   Procedure: RENAL ANGIOGRAM;  Surgeon: Lorretta Harp, MD;  Location: [facility] CATH LAB;  Service: Cardiovascular;  Laterality: Bilateral; 6 mm x 12 mm long Herculink balloon expandable stent to the left renal artery  . RENAL ARTERY STENT Left 04/22/2015   dr berry  . SPLENECTOMY  02/2003   Archie Endo 04/21/2011  . TONSILLECTOMY  ~ 1952        Home Medications    Prior to Admission medications   Medication Sig Start Date End Date Taking? Authorizing Provider  amLODipine (NORVASC) 10 MG tablet Take 10 mg by mouth daily. 02/11/15  Yes [provider]  aspirin EC 81 MG EC tablet Take 1 tablet (81 mg total) by mouth daily. Patient taking differently: Take 324 mg by mouth as needed.  09/20/19 09/19/20 Yes Daune Perch, NP  atorvastatin (LIPITOR) 80 MG tablet Take 80 mg by mouth daily.    Yes [provider]  canagliflozin (INVOKANA) 300 MG TABS tablet Take 300 mg by mouth daily.    Yes [provider]  enoxaparin (LOVENOX) 80 MG/0.8ML injection Inject 0.8 mLs (80 mg total) into the skin every 12 (twelve) hours. 09/19/19  Yes Daune Perch, NP  Evolocumab (REPATHA SURECLICK) 417 MG/ML SOAJ Inject 140 mg into the skin every 14 (fourteen) days.   Yes [provider]  furosemide (LASIX) 20 MG tablet Take 1 tablet (20 mg total) by mouth 2 (two) times daily. 09/29/19  Yes Josue Hector, MD   glyBURIDE (DIABETA) 2.5 MG tablet Take 5 mg by mouth 2 (two) times daily with a meal.    Yes [provider]  isosorbide mononitrate (IMDUR) 30 MG 24 hr tablet Take 1 tablet (30 mg total) by mouth daily. 09/20/19 09/19/20 Yes Daune Perch, NP  metoprolol succinate (TOPROL-XL) 100 MG 24 hr tablet Take 1 tablet (100 mg total) by mouth daily. Take with or immediately following a meal. 09/20/19 09/19/20 Yes Daune Perch, NP  nitroGLYCERIN (NITROSTAT) 0.4 MG SL tablet Place 1 tablet (0.4 mg total) under the tongue every 5 (five) minutes x 3 doses as needed for chest pain. 09/19/19  Yes Daune Perch, NP  pantoprazole (PROTONIX) 40 MG tablet Take 40 mg by mouth daily. 09/22/19  Yes [provider]  trandolapril (MAVIK) 1 MG tablet Take 1 tablet (1 mg total) by mouth daily. 09/20/19 09/19/20 Yes Daune Perch, NP  warfarin (COUMADIN) 10 MG tablet Take 5 mg on Mon/Wed/Fri and 10 mg on Tue/Thur/Sat/Sun. Patient taking differently: Take 5-10 mg by mouth See admin instructions. Take 5 mg on Mon/Wed/Fri/Sat and 10 mg on Tue/Thur/Sun. 09/19/19  Yes Daune Perch, NP    Family History Family History  Problem Relation Age of Onset  . Coronary artery disease Mother        bypass surgery - deceased  . Heart disease Father        murmur, valve replacement - deceased  . Breast cancer Sister   . Diabetes Other        grandmother  . Diabetes Paternal Grandmother   . Diabetes Paternal Aunt   . Colon cancer Neg Hx   . Colon polyps Neg Hx   . Esophageal cancer Neg Hx   . Rectal cancer Neg Hx   . Stomach cancer Neg Hx     Social History Social History   Tobacco Use  . Smoking status: Light Tobacco Smoker    Years: 30.00    Types: Cigarettes, Cigars    Last attempt to quit: 12/15/1993    Years since quitting: 25.8  . Smokeless tobacco: Never Used  . Tobacco comment: occasional cigar  Substance Use Topics  . Alcohol use: Yes    Alcohol/week: 16.0 standard drinks    Types:  1 Cans of beer, 1 Shots of liquor, 14 Standard drinks or equivalent per week    Comment: drinks 2 martini's a night (2 shots in each)  . Drug use: No     Allergies   Patient has no known allergies.   Review of Systems Review of Systems  Constitutional: Positive for diaphoresis. Negative for chills and fever.  HENT: Negative for congestion  and facial swelling.   Eyes: Negative for discharge and visual disturbance.  Respiratory: Positive for shortness of breath.   Cardiovascular: Positive for chest pain and leg swelling. Negative for palpitations.  Gastrointestinal: Negative for abdominal pain, diarrhea and vomiting.  Musculoskeletal: Negative for arthralgias and myalgias.  Skin: Negative for color change and rash.  Neurological: Negative for tremors, syncope and headaches.  Psychiatric/Behavioral: Negative for confusion and dysphoric mood.     Physical Exam Updated Vital Signs BP (!) 132/49   Pulse 71   Temp (!) 97.4 F (36.3 C)   Resp 18   SpO2 92%   Physical Exam Vitals signs and nursing note reviewed.  Constitutional:      Appearance: He is well-developed.  HENT:     Head: Normocephalic and atraumatic.  Eyes:     Pupils: Pupils are equal, round, and reactive to light.  Neck:     Musculoskeletal: Normal range of motion and neck supple.     Vascular: No JVD.  Cardiovascular:     Rate and Rhythm: Normal rate and regular rhythm.     Heart sounds: No murmur. No friction rub. No gallop.   Pulmonary:     Effort: No respiratory distress.     Breath sounds: No wheezing.  Abdominal:     General: There is no distension.     Tenderness: There is no guarding or rebound.  Musculoskeletal: Normal range of motion.     Right lower leg: Edema present.     Left lower leg: Edema present.     Comments: 3+ edema to BLE  Skin:    Coloration: Skin is not pale.     Findings: No rash.  Neurological:     Mental Status: He is alert and oriented to person, place, and time.   Psychiatric:        Behavior: Behavior normal.      ED Treatments / Results  Labs (all labs ordered are listed, but only abnormal results are displayed) Labs Reviewed  CBC - Abnormal; Notable for the following components:      Result Value   RBC 4.06 (*)    Hemoglobin 9.7 (*)    HCT 32.8 (*)    MCH 23.9 (*)    MCHC 29.6 (*)    RDW 24.3 (*)    nRBC 9.7 (*)    All other components within normal limits  COMPREHENSIVE METABOLIC PANEL - Abnormal; Notable for the following components:   Sodium 134 (*)    Chloride 95 (*)    CO2 17 (*)    Glucose, Bld 306 (*)    BUN 35 (*)    Creatinine, Ser 1.64 (*)    Total Protein 6.2 (*)    AST 772 (*)    ALT 796 (*)    GFR calc non Af Amer 41 (*)    GFR calc Af Amer 47 (*)    Anion gap 22 (*)    All other components within normal limits  BRAIN NATRIURETIC PEPTIDE - Abnormal; Notable for the following components:   B Natriuretic Peptide 899.1 (*)    All other components within normal limits  PROTIME-INR - Abnormal; Notable for the following components:   Prothrombin Time 65.3 (*)    INR 8.0 (*)    All other components within normal limits  TROPONIN I (HIGH SENSITIVITY) - Abnormal; Notable for the following components:   Troponin I (High Sensitivity) 389 (*)    All other components within normal limits  TROPONIN  I (HIGH SENSITIVITY) - Abnormal; Notable for the following components:   Troponin I (High Sensitivity) 644 (*)    All other components within normal limits    EKG EKG Interpretation  Date/Time:  Friday October 06 2019 09:58:19 EDT Ventricular Rate:  96 PR Interval:    QRS Duration: 117 QT Interval:  396 QTC Calculation: 379 R Axis:   84 Text Interpretation: Sinus rhythm Ventricular bigeminy Prolonged PR interval Nonspecific intraventricular conduction delay Repol abnrm, severe global ischemia (LM/MVD) st depression diffusely. Not seen on prior Otherwise no significant change Confirmed by Deno Etienne [id number]) on 10/06/2019  10:04:34 AM   Radiology Dg Chest Portable 1 View  Result Date: 10/06/2019 CLINICAL DATA:  Shortness of breath. EXAM: PORTABLE CHEST 1 VIEW COMPARISON:  CT 09/16/2019. FINDINGS: Fracture lower sternotomy wire again noted. Prior CABG. Prior cardiac valve replacement. Cardiomegaly. Aortic and peripheral vascular calcification. Mild bibasilar atelectasis. Mild bibasilar interstitial prominence. Small bilateral pleural effusions. No pneumothorax. IMPRESSION: 1. Prior CABG. Prior cardiac valve replacement. Cardiomegaly. Mild bibasilar pulmonary interstitial prominence and bilateral pleural effusions suggesting mild CHF. 2.  Mild bibasilar atelectasis. Electronically Signed   By: Marcello Moores  Register   On: 10/06/2019 10:22    Procedures Procedures (including critical care time)  Medications Ordered in ED Medications  sodium chloride flush (NS) 0.9 % injection 3 mL (3 mLs Intravenous Not Given 10/06/19 1059)  sodium chloride 0.9 % bolus 500 mL (0 mLs Intravenous Stopped 10/06/19 1400)  aspirin chewable tablet 324 mg (324 mg Oral Given 10/06/19 1148)     Initial Impression / Assessment and Plan / ED Course  I have reviewed the triage vital signs and the nursing notes.  Pertinent labs & imaging results that were available during my care of the patient were reviewed by me and considered in my medical decision making (see chart for details).        73 yo M with a chief complaint of chest pain and shortness of breath on exertion.  This is been worsening for about a month.  Was just in the hospital and had a cardiac catheterization performed that showed very severe disease with 2% stenosis of the distal left main to proximal LAD and 90% mid circumflex lesion and 100% stenosis of the proximal right RCA.  At that time it was thought that he would best benefit from aggressive medical therapy.  The EF estimated on that cath was very minimally diminished 50 to 55%.  That corresponded with his echocardiogram  from the same visit.  Since then the patient has clinically had what appears to be heart failure.  This reportedly is new for him.  His blood pressure is lower than typical for him.  Will obtain lab work chest x-ray discussed with cards.  CRITICAL CARE Performed by: Cecilio Asper   Total critical care time: 35 minutes  Critical care time was exclusive of separately billable procedures and treating other patients.  Critical care was necessary to treat or prevent imminent or life-threatening deterioration.  Critical care was time spent personally by me on the following activities: development of treatment plan with patient and/or surrogate as well as nursing, discussions with consultants, evaluation of patient's response to treatment, examination of patient, obtaining history from patient or surrogate, ordering and performing treatments and interventions, ordering and review of laboratory studies, ordering and review of radiographic studies, pulse oximetry and re-evaluation of patient's condition.  The patients results and plan were reviewed and discussed.   Any x-rays performed were independently  reviewed by myself.   Differential diagnosis were considered with the presenting HPI.  Medications  sodium chloride flush (NS) 0.9 % injection 3 mL (3 mLs Intravenous Not Given 10/06/19 1059)  sodium chloride 0.9 % bolus 500 mL (0 mLs Intravenous Stopped 10/06/19 1400)  aspirin chewable tablet 324 mg (324 mg Oral Given 10/06/19 1148)    Vitals:   10/06/19 1352 10/06/19 1400 10/06/19 1430 10/06/19 1500  BP:  (!) 156/48 (!) 132/44 (!) 132/49  Pulse: 70 71 72 71  Resp: 15 16 15 18   Temp:      SpO2: 94% 93% 92% 92%    Final diagnoses:  Chest pain, cardiac  Acute heart failure, unspecified heart failure type [facility])    Admission/ observation were discussed with the admitting physician, patient and/or family and they are comfortable with the plan.   Final Clinical Impressions(s) / ED  Diagnoses   Final diagnoses:  Chest pain, cardiac  Acute heart failure, unspecified heart failure type [facility])    ED Discharge Orders    None       Deno Etienne, DO 10/06/19 1502

## 2019-10-06 NOTE — ED Notes (Signed)
Dinner Tray Ordered @ 1733. 

## 2019-10-06 NOTE — ED Notes (Signed)
Cardiologist at bedside.  

## 2019-10-06 NOTE — ED Notes (Signed)
Pt states he had another episode of blurry vision. Provider notified.

## 2019-10-06 NOTE — Progress Notes (Signed)
ANTICOAGULATION CONSULT NOTE - Initial Consult  Pharmacy Consult for heparin Indication: chest pain/ACS  No Known Allergies  Patient Measurements:    Vital Signs: Temp: 97.4 F (36.3 C) (10/30 1006) BP: 131/61 (10/30 1300) Pulse Rate: 72 (10/30 1300)  Labs: Recent Labs    10/06/19 0957  HGB 9.7*  HCT 32.8*  PLT 284  LABPROT 65.3*  INR 8.0*  CREATININE 1.64*  TROPONINIHS 389*    CrCl cannot be calculated (Unknown ideal weight.).   Medical History: Past Medical History:  Diagnosis Date  . Adenomatous colon polyp 09/1997  . Anemia   . Aortic stenosis    s/p st. jude mechanical AVR - Chronic Coumadin  . Blood transfusion    "related to ITP"  . Coronary artery disease    s/p cabg x 3 11/2003: lima-lad, seq vg to rpda and rpl  . Diverticulitis of colon   . Heart murmur   . Hyperlipidemia   . Hypertension   . ITP (idiopathic thrombocytopenic purpura)   . Peripheral arterial disease ([place])    a. history of aortobifemoral bypass grafting by Dr. Sherren Mocha early b. LE angiography 04/22/2015 patent aortobifem graft, DES to R SFA  . Peripheral vascular disease ([place])    s/p Left external Iliac Artery stenting and subsequent left femoral endarterectomy 02/2011- post op course complicated by wound infxn req I&D 03/2011  . Renal artery stenosis, native, bilateral (HCC)    a. bilateral renal artery stenosis by recent duplex ultrasound b. L renal artery stent 02/2015, R renal artery patent on angiogram  . Stroke ([place])   . TIA (transient ischemic attack) ~ 2013  . Type II diabetes mellitus (HCC)     Medications:  (Not in a hospital admission)  Scheduled:  . sodium chloride flush  3 mL Intravenous Once    Assessment: Pt is a 78 t/o male who presents with complains of shortness of breath and chest pain. Patient is on warfarin PTA for mechanical valve (INR goal 2.5-3.5). Pharmacy has been consulted to dose heparin  Pt's PTA warfarin dose: 5 mg daily except 10 mg on sundays,  tuesdays, and thursdays.  Pt's INR today is supratherapeutic at 8. Of note, pt's AST/ALT is elevated at 772/796. Some of the INR value is likely elevated from liver dysfunction. No signs/sympotms of bleeding. Hg/Hct are low but stable at 9.7/32.8. Troponins are elevated at 289.  Goal of Therapy:  Heparin level 0.3-0.7 units/ml Monitor platelets by anticoagulation protocol: Yes   Plan:  Hold off starting heparin due to INR of 8. Monitor daily INR F/U on when to start heparin once INR starts decreasing Watch for signs/symptoms of bleeding  Sherren Kerns, PharmD PGY1 Acute Care Pharmacy Resident 10/06/2019,1:17 PM

## 2019-10-06 NOTE — Progress Notes (Signed)
  Echocardiogram 2D Echocardiogram has been performed.  [name] [name] 10/06/2019, 5:29 PM

## 2019-10-06 NOTE — ED Triage Notes (Signed)
TO ed VIA [facility] from home with c/o shortness of breath and chest pain- worsening with any activity-- has not been able to lay flat, has not been able to sleep at night due to shortness of breath.  2-3+ edema to lower legs.  Was here last week with "Infarct" -- increased lasix - doubled dose.

## 2019-10-06 NOTE — ED Notes (Signed)
ED TO INPATIENT HANDOFF REPORT  ED Nurse Name and Phone #: Lunette Stands [place] Name/Age/Gender [name] [name] 73 y.o. male Room/Bed: 031C/031C  Code Status   Code Status: Full Code  Home/SNF/Other Home Patient oriented to: self, place, time and situation Is this baseline? Yes   Triage Complete: Triage complete  Chief Complaint chest pain  Triage Note TO ed [facility]IA [facility] from home with c/o shortness of breath and chest pain- worsening with any activity-- has not been able to lay flat, has not been able to sleep at night due to shortness of breath.  2-3+ edema to lower legs.  Was here last week with "Infarct" -- increased lasix - doubled dose.    Allergies No Known Allergies  Level of Care/Admitting Diagnosis ED Disposition    ED Disposition Condition Comment   Admit  Hospital Area: [facility] [[id number]]  Level of Care: ICU [6]  Covid Evaluation: Asymptomatic Screening Protocol (No Symptoms)  Diagnosis: Acute systolic heart failure ([place]) [428.21.ICD-9-CM]  Admitting Physician: Bronson Ing  Attending Physician: Bronson Ing  Estimated length of stay: 5 - 7 days  Certification:: I certify this patient will need inpatient services for at least 2 midnights  Bed request comments: 2H  PT Class (Do Not Modify): Inpatient [101]  PT Acc Code (Do Not Modify): Private [1]       B Medical/Surgery History Past Medical History:  Diagnosis Date  . Adenomatous colon polyp 09/1997  . Anemia   . Aortic stenosis    s/p st. jude mechanical AVR - Chronic Coumadin  . Blood transfusion    "related to ITP"  . Coronary artery disease    s/p cabg x 3 11/2003: lima-lad, seq vg to rpda and rpl  . Diverticulitis of colon   . Heart murmur   . Hyperlipidemia   . Hypertension   . ITP (idiopathic thrombocytopenic purpura)   . Peripheral arterial disease ([place])    a. history of aortobifemoral bypass grafting by Dr. Sherren Mocha early b. LE angiography  04/22/2015 patent aortobifem graft, DES to R SFA  . Peripheral vascular disease ([place])    s/p Left external Iliac Artery stenting and subsequent left femoral endarterectomy 02/2011- post op course complicated by wound infxn req I&D 03/2011  . Renal artery stenosis, native, bilateral (H[facility])    a. bilateral renal artery stenosis by recent duplex ultrasound b. L renal artery stent 02/2015, R renal artery patent on angiogram  . Stroke ([place])   . TIA (transient ischemic attack) ~ 2013  . Type II diabetes mellitus ([place])    Past Surgical History:  Procedure Laterality Date  . ABDOMINAL AORTAGRAM N/A 12/16/2011   Procedure: ABDOMINAL Maxcine Ham;  Surgeon: Sherren Mocha, MD;  Location: [facility] CATH LAB;  Service: Cardiovascular;  Laterality: N/A;  . ANGIOPLASTY / STENTING ILIAC     Left external Iliac Artery  . AORTA - BILATERAL FEMORAL ARTERY BYPASS GRAFT  01/18/2012   Procedure: AORTA BIFEMORAL BYPASS GRAFT;  Surgeon: Curt Jews, MD;  Location: [place];  Service: Vascular;  Laterality: N/A;  . AORTIC VALVE REPLACEMENT  ~ 2004  . CARDIAC CATHETERIZATION  11/2003   /pt report 10/01/2016  . CARDIAC VALVE REPLACEMENT  11/2003   aortic  . CATARACT EXTRACTION W/ INTRAOCULAR LENS  IMPLANT, BILATERAL Bilateral   . COLONOSCOPY    . COLONOSCOPY WITH PROPOFOL N/A 09/03/2019   Procedure: COLONOSCOPY WITH PROPOFOL;  Surgeon: Doran Stabler, MD;  Location: [facility] ENDOSCOPY;  Service:  Gastroenterology;  Laterality: N/A;  . CORONARY ARTERY BYPASS GRAFT  11/2003   Archie Endo 04/21/2011  . CORONARY/GRAFT ANGIOGRAPHY N/A 09/18/2019   Procedure: CORONARY/GRAFT ANGIOGRAPHY;  Surgeon: Lorretta Harp, MD;  Location: [place] CV LAB;  Service: Cardiovascular;  Laterality: N/A;  . ESOPHAGOGASTRODUODENOSCOPY (EGD) WITH PROPOFOL N/A 02/27/2019   Procedure: ESOPHAGOGASTRODUODENOSCOPY (EGD) WITH PROPOFOL;  Surgeon: Doran Stabler, MD;  Location: [place];  Service: Endoscopy;  Laterality: N/A;  . HEMOSTASIS CLIP PLACEMENT   09/03/2019   Procedure: HEMOSTASIS CLIP PLACEMENT;  Surgeon: Doran Stabler, MD;  Location: [facility];  Service: Gastroenterology;;  . LOWER EXTREMITY ANGIOGRAM N/A 02/21/2015   Procedure: LOWER EXTREMITY ANGIOGRAM;  Surgeon: Lorretta Harp, MD;  Location: [facility] CATH LAB;  Service: Cardiovascular;  Laterality: N/A;  . PERIPHERAL VASCULAR CATHETERIZATION N/A 04/22/2015   Procedure: Lower Extremity Angiography;  Surgeon: Lorretta Harp, MD;  Location: [place] CV LAB;  Service: Cardiovascular;  Laterality: N/A;  . PERIPHERAL VASCULAR CATHETERIZATION N/A 08/24/2016   Procedure: Lower Extremity Angiography;  Surgeon: Lorretta Harp, MD;  Location: [place] CV LAB;  Service: Cardiovascular;  Laterality: N/A;  . PERIPHERAL VASCULAR CATHETERIZATION Right 10/01/2016   Procedure: Peripheral Vascular Intervention - STENT;  Surgeon: Lorretta Harp, MD;  Location: [place] CV LAB;  Service: Cardiovascular;  Laterality: Right;  Prox and MID SFA   . POLYPECTOMY    . RENAL ANGIOGRAM N/A 02/21/2015   Procedure: RENAL ANGIOGRAM;  Surgeon: Lorretta Harp, MD;  Location: [facility] CATH LAB;  Service: Cardiovascular;  Laterality: Bilateral; 6 mm x 12 mm long Herculink balloon expandable stent to the left renal artery  . RENAL ARTERY STENT Left 04/22/2015   dr berry  . SPLENECTOMY  02/2003   Archie Endo 04/21/2011  . TONSILLECTOMY  ~ 1952     A IV Location/Drains/Wounds Patient Lines/Drains/Airways Status   Active Line/Drains/Airways    Name:   Placement date:   Placement time:   Site:   Days:   Peripheral IV 10/06/19 Anterior;Left Forearm   10/06/19    1001    Forearm   less than 1   Peripheral IV 10/06/19 Right Antecubital   10/06/19    1029    Antecubital   less than 1          Intake/Output Last 24 hours  Intake/Output Summary (Last 24 hours) at 10/06/2019 1857 Last data filed at 10/06/2019 1400 Gross per 24 hour  Intake 550 ml  Output -  Net 550 ml    Labs/Imaging Results for orders  placed or performed during the hospital encounter of 10/06/19 (from the past 48 hour(s))  CBC     Status: Abnormal   Collection Time: 10/06/19  9:57 AM  Result Value Ref Range   WBC 7.7 4.0 - 10.5 K/uL    Comment: REPEATED TO VERIFY WHITE COUNT CONFIRMED ON SMEAR    RBC 4.06 (L) 4.22 - 5.81 MIL/uL   Hemoglobin 9.7 (L) 13.0 - 17.0 g/dL   HCT 32.8 (L) 39.0 - 52.0 %   MCV 80.8 80.0 - 100.0 fL   MCH 23.9 (L) 26.0 - 34.0 pg   MCHC 29.6 (L) 30.0 - 36.0 g/dL   RDW 24.3 (H) 11.5 - 15.5 %   Platelets 284 150 - 400 K/uL   nRBC 9.7 (H) 0.0 - 0.2 %    Comment: Performed at [facility], [address] [place], [place] [address]  Comprehensive metabolic panel     Status:  Abnormal   Collection Time: 10/06/19  9:57 AM  Result Value Ref Range   Sodium 134 (L) 135 - 145 mmol/L   Potassium 4.6 3.5 - 5.1 mmol/L   Chloride 95 (L) 98 - 111 mmol/L   CO2 17 (L) 22 - 32 mmol/L   Glucose, Bld 306 (H) 70 - 99 mg/dL   BUN 35 (H) 8 - 23 mg/dL   Creatinine, Ser 1.64 (H) 0.61 - 1.24 mg/dL   Calcium 9.1 8.9 - 10.3 mg/dL   Total Protein 6.2 (L) 6.5 - 8.1 g/dL   Albumin 3.5 3.5 - 5.0 g/dL   AST 772 (H) 15 - 41 U/L   ALT 796 (H) 0 - 44 U/L   Alkaline Phosphatase 81 38 - 126 U/L   Total Bilirubin 1.2 0.3 - 1.2 mg/dL   GFR calc non Af Amer 41 (L) >60 mL/min   GFR calc Af Amer 47 (L) >60 mL/min   Anion gap 22 (H) 5 - 15    Comment: Performed at [facility], [address], [place], [place] [address]  Protime-INR     Status: Abnormal   Collection Time: 10/06/19  9:57 AM  Result Value Ref Range   Prothrombin Time 65.3 (H) 11.4 - 15.2 seconds   INR 8.0 (HH) 0.8 - 1.2    Comment: REPEATED TO VERIFY CRITICAL RESULT CALLED TO, READ BACK BY AND VERIFIED WITH: ALenice Llamas, RN 1209 10/06/2019 BY MACEDA,J. (NOTE) INR goal varies based on device and disease states. Performed at [facility], [place] [address] [place], [place] [address]   Troponin I (High Sensitivity)     Status: Abnormal    Collection Time: 10/06/19  9:57 AM  Result Value Ref Range   Troponin I (High Sensitivity) 389 (HH) <18 ng/L    Comment: CRITICAL RESULT CALLED TO, READ BACK BY AND VERIFIED WITH: L Gid Schoffstall RN [phone] [phone] BY A BENNETT (NOTE) Elevated high sensitivity troponin I (hsTnI) values and significant  changes across serial measurements may suggest ACS but many other  chronic and acute conditions are known to elevate hsTnI results.  Refer to the Links section for chest pain algorithms and additional  guidance. Performed at [facility], [place] [address], [place], [place] [address]   Brain natriuretic peptide     Status: Abnormal   Collection Time: 10/06/19  9:58 AM  Result Value Ref Range   B Natriuretic Peptide 899.1 (H) 0.0 - 100.0 pg/mL    Comment: Performed at [facility] [address] [place], [place] [address]  Troponin I (High Sensitivity)     Status: Abnormal   Collection Time: 10/06/19 12:03 PM  Result Value Ref Range   Troponin I (High Sensitivity) 644 (HH) <18 ng/L    Comment: CRITICAL VALUE NOTED.  VALUE IS CONSISTENT WITH PREVIOUSLY REPORTED AND CALLED VALUE. (NOTE) Elevated high sensitivity troponin I (hsTnI) values and significant  changes across serial measurements may suggest ACS but many other  chronic and acute conditions are known to elevate hsTnI results.  Refer to the Links section for chest pain algorithms and additional  guidance. Performed at [facility], [place] [address] [place], [place] [address]    Dg Chest Portable 1 View  Result Date: 10/06/2019 CLINICAL DATA:  Shortness of breath. EXAM: PORTABLE CHEST 1 VIEW COMPARISON:  CT 09/16/2019. FINDINGS: Fracture lower sternotomy wire again noted. Prior CABG. Prior cardiac valve replacement. Cardiomegaly. Aortic and peripheral vascular calcification. Mild bibasilar atelectasis.  Mild bibasilar interstitial prominence. Small bilateral pleural effusions. No pneumothorax. IMPRESSION: 1. Prior CABG.  Prior cardiac valve replacement. Cardiomegaly. Mild bibasilar pulmonary interstitial prominence and bilateral pleural effusions suggesting mild CHF. 2.  Mild bibasilar atelectasis. Electronically Signed   By: Marcello Moores  Register   On: 10/06/2019 10:22    Pending Labs Unresulted Labs (From admission, onward)    Start     Ordered   10/07/19 0500  Protime-INR  Daily,   R     10/06/19 1318   10/07/19 0500  Protime-INR  Daily,   R     10/06/19 1625   10/07/19 0500  Lipid panel  Tomorrow morning,   R     10/06/19 1724   10/07/19 0500  CBC  Daily,   R     10/06/19 1705   10/07/19 6433  Basic metabolic panel  Daily,   R     10/06/19 1705   10/07/19 0500  Hepatic function panel  Daily,   R     10/06/19 1705   10/06/19 1725  Magnesium  Add-on,   AD     10/06/19 1724   10/06/19 1725  TSH  Add-on,   AD     10/06/19 1724          Vitals/Pain Today's Vitals   10/06/19 1810 10/06/19 1812 10/06/19 1830 10/06/19 1831  BP: (!) 119/47  (!) 126/58   Pulse:  74 70 70  Resp:  (!) 21 20 20   Temp:      SpO2:  96% 95% 93%    Isolation Precautions No active isolations  Medications Medications  sodium chloride flush (NS) 0.9 % injection 3 mL (3 mLs Intravenous Not Given 10/06/19 1059)  amiodarone (NEXTERONE) 1.8 mg/mL load via infusion 150 mg (150 mg Intravenous Bolus from Bag 10/06/19 1700)    Followed by  amiodarone (NEXTERONE PREMIX) 360-4.14 MG/200ML-% (1.8 mg/mL) IV infusion (60 mg/hr Intravenous New Bag/Given 10/06/19 1806)    Followed by  amiodarone (NEXTERONE PREMIX) 360-4.14 MG/200ML-% (1.8 mg/mL) IV infusion (has no administration in time range)  atorvastatin (LIPITOR) tablet 80 mg (80 mg Oral Given 10/06/19 1824)  isosorbide mononitrate (IMDUR) 24 hr tablet 30 mg (has no administration in time range)  nitroGLYCERIN (NITROSTAT) SL tablet 0.4 mg (has no administration in time range)  pantoprazole (PROTONIX) EC tablet 40 mg (40 mg Oral Given 10/06/19 1824)  aspirin EC tablet 81 mg (has  no administration in time range)  nitroGLYCERIN (NITROSTAT) SL tablet 0.4 mg (has no administration in time range)  acetaminophen (TYLENOL) tablet 650 mg (has no administration in time range)  ondansetron (ZOFRAN) injection 4 mg (has no administration in time range)  furosemide (LASIX) injection 60 mg (60 mg Intravenous Given 10/06/19 1722)  sodium chloride 0.9 % bolus 500 mL (0 mLs Intravenous Stopped 10/06/19 1400)  aspirin chewable tablet 324 mg (324 mg Oral Given 10/06/19 1148)  phytonadione (VITAMIN K) 5 mg in dextrose 5 % 50 mL IVPB (0 mg Intravenous Stopped 10/06/19 1814)    Mobility non-ambulatory Low fall risk   Focused Assessments Cardiac Assessment Handoff:  Cardiac Rhythm: Normal sinus rhythm Lab Results  Component Value Date   TROPONINI <0.03 02/23/2019   No results found for: DDIMER Does the Patient currently have chest pain? No     R Recommendations: See Admitting Provider Note  Report given to:   Additional Notes: N/A

## 2019-10-07 ENCOUNTER — Encounter (HOSPITAL_COMMUNITY): Payer: Self-pay

## 2019-10-07 ENCOUNTER — Inpatient Hospital Stay: Payer: Self-pay

## 2019-10-07 ENCOUNTER — Other Ambulatory Visit: Payer: Self-pay

## 2019-10-07 DIAGNOSIS — I5021 Acute systolic (congestive) heart failure: Secondary | ICD-10-CM

## 2019-10-07 DIAGNOSIS — I214 Non-ST elevation (NSTEMI) myocardial infarction: Principal | ICD-10-CM

## 2019-10-07 LAB — LACTIC ACID, PLASMA
Lactic Acid, Venous: 1.4 mmol/L (ref 0.5–1.9)
Lactic Acid, Venous: 1.8 mmol/L (ref 0.5–1.9)

## 2019-10-07 LAB — CBC
HCT: 29.7 % — ABNORMAL LOW (ref 39.0–52.0)
Hemoglobin: 9.2 g/dL — ABNORMAL LOW (ref 13.0–17.0)
MCH: 23.8 pg — ABNORMAL LOW (ref 26.0–34.0)
MCHC: 31 g/dL (ref 30.0–36.0)
MCV: 76.9 fL — ABNORMAL LOW (ref 80.0–100.0)
Platelets: 307 10*3/uL (ref 150–400)
RBC: 3.86 MIL/uL — ABNORMAL LOW (ref 4.22–5.81)
RDW: 24.4 % — ABNORMAL HIGH (ref 11.5–15.5)
WBC: 8 10*3/uL (ref 4.0–10.5)
nRBC: 12.5 % — ABNORMAL HIGH (ref 0.0–0.2)

## 2019-10-07 LAB — HEPATIC FUNCTION PANEL
ALT: 1017 U/L — ABNORMAL HIGH (ref 0–44)
AST: 992 U/L — ABNORMAL HIGH (ref 15–41)
Albumin: 3.3 g/dL — ABNORMAL LOW (ref 3.5–5.0)
Alkaline Phosphatase: 77 U/L (ref 38–126)
Bilirubin, Direct: 0.1 mg/dL (ref 0.0–0.2)
Indirect Bilirubin: 0.5 mg/dL (ref 0.3–0.9)
Total Bilirubin: 0.6 mg/dL (ref 0.3–1.2)
Total Protein: 5.8 g/dL — ABNORMAL LOW (ref 6.5–8.1)

## 2019-10-07 LAB — LIPID PANEL
Cholesterol: 74 mg/dL (ref 0–200)
HDL: 45 mg/dL (ref >40)
LDL Cholesterol: 20 mg/dL (ref 0–99)
Total CHOL/HDL Ratio: 1.6 RATIO
Triglycerides: 46 mg/dL (ref <150)
VLDL: 9 mg/dL (ref 0–40)

## 2019-10-07 LAB — COOXEMETRY PANEL
Carboxyhemoglobin: 1.4 % (ref 0.5–1.5)
Methemoglobin: 1.2 % (ref 0.0–1.5)
O2 Saturation: 53.9 %
Total hemoglobin: 9.6 g/dL — ABNORMAL LOW (ref 12.0–16.0)

## 2019-10-07 LAB — GLUCOSE, CAPILLARY
Glucose-Capillary: 277 mg/dL — ABNORMAL HIGH (ref 70–99)
Glucose-Capillary: 270 mg/dL — ABNORMAL HIGH (ref 70–99)
Glucose-Capillary: 201 mg/dL — ABNORMAL HIGH (ref 70–99)

## 2019-10-07 LAB — BASIC METABOLIC PANEL
Anion gap: 14 (ref 5–15)
BUN: 33 mg/dL — ABNORMAL HIGH (ref 8–23)
CO2: 23 mmol/L (ref 22–32)
Calcium: 8.5 mg/dL — ABNORMAL LOW (ref 8.9–10.3)
Chloride: 99 mmol/L (ref 98–111)
Creatinine, Ser: 1.37 mg/dL — ABNORMAL HIGH (ref 0.61–1.24)
GFR calc Af Amer: 58 mL/min — ABNORMAL LOW (ref >60)
GFR calc non Af Amer: 50 mL/min — ABNORMAL LOW (ref >60)
Glucose, Bld: 212 mg/dL — ABNORMAL HIGH (ref 70–99)
Potassium: 3.8 mmol/L (ref 3.5–5.1)
Sodium: 136 mmol/L (ref 135–145)

## 2019-10-07 LAB — PROTIME-INR
INR: 2.8 — ABNORMAL HIGH (ref 0.8–1.2)
Prothrombin Time: 29.2 seconds — ABNORMAL HIGH (ref 11.4–15.2)

## 2019-10-07 LAB — SARS CORONAVIRUS 2 BY RT PCR (HOSPITAL ORDER, PERFORMED IN ~~LOC~~ HOSPITAL LAB): SARS Coronavirus 2: NEGATIVE

## 2019-10-07 MED ORDER — SPIRONOLACTONE 12.5 MG HALF TABLET
12.5000 mg | ORAL_TABLET | Freq: Every day | ORAL | Status: DC
  Administered 2019-10-07: 12.5 mg via ORAL
  Filled 2019-10-07: qty 1

## 2019-10-07 MED ORDER — CHLORHEXIDINE GLUCONATE CLOTH 2 % EX PADS
6.0000 | MEDICATED_PAD | Freq: Every day | CUTANEOUS | Status: DC
  Administered 2019-10-07 – 2019-10-16 (×10): 6 via TOPICAL

## 2019-10-07 MED ORDER — ISOSORBIDE MONONITRATE ER 60 MG PO TB24
60.0000 mg | ORAL_TABLET | Freq: Every day | ORAL | Status: DC
  Administered 2019-10-08 – 2019-10-11 (×4): 60 mg via ORAL
  Filled 2019-10-07 (×5): qty 1

## 2019-10-07 MED ORDER — SODIUM CHLORIDE 0.9% FLUSH
10.0000 mL | Freq: Two times a day (BID) | INTRAVENOUS | Status: DC
  Administered 2019-10-07 – 2019-10-17 (×13): 10 mL

## 2019-10-07 MED ORDER — POTASSIUM CHLORIDE CRYS ER 20 MEQ PO TBCR
40.0000 meq | EXTENDED_RELEASE_TABLET | Freq: Once | ORAL | Status: AC
  Administered 2019-10-07: 40 meq via ORAL
  Filled 2019-10-07: qty 2

## 2019-10-07 MED ORDER — SODIUM CHLORIDE 0.9% FLUSH: 10.0000 mL | INTRAVENOUS | Status: DC | PRN

## 2019-10-07 MED ORDER — CARVEDILOL 3.125 MG PO TABS
3.1250 mg | ORAL_TABLET | Freq: Two times a day (BID) | ORAL | Status: DC
  Administered 2019-10-07 – 2019-10-12 (×9): 3.125 mg via ORAL
  Filled 2019-10-07 (×10): qty 1

## 2019-10-07 NOTE — Progress Notes (Signed)
Peripherally Inserted Central Catheter/Midline Placement  The IV Nurse has discussed with the patient and/or persons authorized to consent for the patient, the purpose of this procedure and the potential benefits and risks involved with this procedure.  The benefits include less needle sticks, lab draws from the catheter, and the patient may be discharged home with the catheter. Risks include, but not limited to, infection, bleeding, blood clot (thrombus formation), and puncture of an artery; nerve damage and irregular heartbeat and possibility to perform a PICC exchange if needed/ordered by physician.  Alternatives to this procedure were also discussed.  Bard Power PICC patient education guide, fact sheet on infection prevention and patient information card has been provided to patient /or left at bedside.    PICC/Midline Placement Documentation  PICC Triple Lumen 10/07/19 PICC Right Cephalic 40 cm 0 cm (Active)  Indication for Insertion or Continuance of Line Vasoactive infusions;Chronic illness with exacerbations (CF, Sickle Cell, etc.) 10/07/19 1322  Exposed Catheter (cm) 0 cm 10/07/19 1322  Site Assessment Clean;Dry;Intact 10/07/19 1322  Lumen #1 Status Flushed;Saline locked;Blood return noted 10/07/19 1322  Lumen #2 Status Flushed;Saline locked;Blood return noted 10/07/19 1322  Lumen #3 Status Flushed;Saline locked;Blood return noted 10/07/19 1322  Dressing Type Transparent 10/07/19 1322  Dressing Status Clean;Dry;Intact;Antimicrobial disc in place 10/07/19 [place] Connections checked and tightened 10/07/19 1322  Line Adjustment (NICU/IV Team Only) No 10/07/19 1322  Dressing Intervention New dressing 10/07/19 3817  Dressing Change Due 10/14/19 10/07/19 1322       Rolena Infante 10/07/2019, 1:23 PM

## 2019-10-07 NOTE — Progress Notes (Signed)
Patient ID: [name] [name], male   DOB: 11-Feb-1946, 73 y.o.   MRN: [id number]     Advanced Heart Failure Rounding Note  PCP-Cardiologist: Jenkins Rouge, MD   Subjective:    He is diuresing well on IV Lasix, feeling better.  No dyspnea or chest pain at rest.    Hs-TnI 398 => 644 => 935  On amiodarone, no further NSVT.    BP stable, creatinine trending down at 1.37 today.  LFTs still elevated.   He got vitamin K yesterday, INR 2.8 today. No overt GI bleeding though hgb lower at 9.2.   Echo: EF 30-35%,  Anterior and anteroseptal hypokinesis, basal-mid inferolateral and anterolateral hypokinesis, normal RV, mechanical aortic valve looks ok, dilated IVC.    Objective:   Weight Range: 80.5 kg Body mass index is 26.98 kg/m.   Vital Signs:   Temp:  [98.4 F (36.9 C)-98.8 F (37.1 C)] 98.4 F (36.9 C) (10/31 0751) Pulse Rate:  [25-77] 74 (10/31 1000) Resp:  [14-23] 17 (10/31 1000) BP: (93-166)/(23-102) 156/44 (10/31 1000) SpO2:  [90 %-97 %] 93 % (10/31 1000) Weight:  [80.5 kg] 80.5 kg (10/31 0600) Last BM Date: 10/06/19  Weight change: Filed Weights   10/07/19 0600  Weight: 80.5 kg    Intake/Output:   Intake/Output Summary (Last 24 hours) at 10/07/2019 1107 Last data filed at 10/07/2019 1000 Gross per 24 hour  Intake 1523.39 ml  Output 1700 ml  Net -176.61 ml      Physical Exam    General:  Well appearing. No resp difficulty HEENT: Normal Neck: Supple. JVP 14 cm. Carotids 2+ bilat; no bruits. No lymphadenopathy or thyromegaly appreciated. Cor: PMI nondisplaced. Regular rate & rhythm with mechanical S2. No rubs, gallops. 2/6 early SEM RUSB.  Lungs: Clear Abdomen: Soft, nontender, nondistended. No hepatosplenomegaly. No bruits or masses. Good bowel sounds. Extremities: No cyanosis, clubbing, rash. 1+ ankle edema.  Neuro: Alert & orientedx3, cranial nerves grossly intact. moves all 4 extremities w/o difficulty. Affect pleasant   Telemetry   NSR.   Frequent NSVT last night, now resolved.  Personally reviewed.   EKG    NSR, inferolateral ST depression/T wave inversions concerning for ischemia.  Personally reviewed.   Labs    CBC Recent Labs    10/06/19 0957 10/07/19 0231  WBC 7.7 8.0  HGB 9.7* 9.2*  HCT 32.8* 29.7*  MCV 80.8 76.9*  PLT 284 284   Basic Metabolic Panel Recent Labs    10/06/19 0957 10/06/19 1725 10/07/19 0231  NA 134*  --  136  K 4.6  --  3.8  CL 95*  --  99  CO2 17*  --  23  GLUCOSE 306*  --  212*  BUN 35*  --  33*  CREATININE 1.64*  --  1.37*  CALCIUM 9.1  --  8.5*  MG  --  1.9  --    Liver Function Tests Recent Labs    10/06/19 0957 10/07/19 0231  AST 772* 992*  ALT 796* 1,017*  ALKPHOS 81 77  BILITOT 1.2 0.6  PROT 6.2* 5.8*  ALBUMIN 3.5 3.3*   No results for input(s): LIPASE, AMYLASE in the last 72 hours. Cardiac Enzymes No results for input(s): CKTOTAL, CKMB, CKMBINDEX, TROPONINI in the last 72 hours.  BNP: BNP (last 3 results) Recent Labs    09/16/19 1008 10/06/19 0958  BNP 676.4* 899.1*    ProBNP (last 3 results) Recent Labs    10/02/19 1131  PROBNP 3,116*  D-Dimer No results for input(s): DDIMER in the last 72 hours. Hemoglobin A1C No results for input(s): HGBA1C in the last 72 hours. Fasting Lipid Panel Recent Labs    10/07/19 0231  CHOL 74  HDL 45  LDLCALC 20  TRIG 46  CHOLHDL 1.6   Thyroid Function Tests Recent Labs    10/06/19 1958  TSH 2.798    Other results:   Imaging    [place] Ekg Site Rite  Result Date: 10/07/2019 If Site Rite image not attached, placement could not be confirmed due to current cardiac rhythm.     Medications:     Scheduled Medications: . aspirin EC  81 mg Oral Daily  . atorvastatin  80 mg Oral Daily  . carvedilol  3.125 mg Oral BID WC  . Chlorhexidine Gluconate Cloth  6 each Topical Daily  . furosemide  60 mg Intravenous BID  . [START ON 10/08/2019] isosorbide mononitrate  60 mg Oral Daily  . pantoprazole   40 mg Oral Daily  . potassium chloride  40 mEq Oral Once  . sodium chloride flush  3 mL Intravenous Once  . spironolactone  12.5 mg Oral Daily     Infusions: . amiodarone 30 mg/hr (10/07/19 0500)     PRN Medications:  acetaminophen, nitroGLYCERIN, ondansetron (ZOFRAN) IV    Assessment/Plan   1. Acute systolic CHF: Ischemic cardiomyopathy.  Echo this admission with EF 30-35% and wall motion abnormalities, prior echo earlier this month showed EF 50-55%.  Cath earlier this month showed jeopardized ramus and LCx territory (99% distal left main, occluded LAD, patent LIMA-LAD).  I am concerned that ischemia in this territory has triggered CHF.  Echo showed lateral wall hypokinesis.  On exam, he is significantly volume overloaded.  With elevated LFTs and creatinine at admission, I am concerned for low output/cardiogenic shock physiology.  Creatinine now better.   - Send lactate.  - Place PICC line, follow co-ox and CVP.  He could be on an inotrope if needed at this point as ectopy has settled down with amiodarone.  - Continue Lasix 60 mg IV bid with good response so far.  - Add spironolactone 12.5 daily.  - Add Coreg 3.125 mg bid.  - No ACEI/ARB/ARNI with AKI.  2. CAD: Complicated disease, coronary angiography earlier this month showed patent SVG-RCA territory and patent LIMA-LAD.  The proximal LAD was occluded and there was 99% distal left main stenosis.  This left the ramus and LCx in jeopardy.  He was not a good candidate for protected PCI => cannot place Impella with mechanical aortic valve and peripheral vascular disease likely precludes a femoral IABP.  It was decided to try to manage him medically.  However, he returns with progressive chest pain episodes and NSTEMI, hs-TnI up to 935.  Now chest pain-free at rest.   - INR 2.8, hold warfarin and start heparin gtt when INR < 2.  - Start Coreg as above.  - Continue ASA 81 and atorvastatin 80 daily.  - Increase Imdur to 60 daily.  - Will  discuss with interventional colleagues.  At this point, he is failing medical management and will need intervention on distal left main.  Will need to consider axillary IABP protection, can be placed by cardiac surgery.  3. Mechanical aortic valve: Stable on echo this admission.  - Continue ASA 81.  - Warfarin on hold, INR 2.8, start heparin gtt when INR < 2.  4. NSVT: Frequent runs NSVT yesterday, amiodarone gtt started.  NSVT seems  to have resolved. Likely due to ischemia.  - Continue amiodarone gtt.  - Replete K and Mg.  5. H/o GI bleeding: Hgb 9.2 today, no overt bleeding.  INR 8 initially, now improved.  Continue to follow.  6. PAD: Patient with aorto-bifemoral bypass, h/o iliac stenting, h/o femoral endarterectomy, PCI to right SFA in 2017.  - I think femoral IABP placement would be difficult.  If needed, axillary may be our best option.  7. H/o renal artery stenosis.  8. AKI: Concern for cardiorenal syndrome.  Creatinine better this morning at 1.37.  Follow closely.  Will be getting PICC to monitor co-ox.  9. Elevated LFTs: Shock liver versus congestive hepatopathy.  LFTs higher today. I do not think that amiodarone is the cause (elevated LFTs prior to amiodarone).  - Continue IV Lasix.  - Check co-ox, ?need for inotrope.   CRITICAL CARE Performed by: Loralie Champagne  Total critical care time: 40 minutes  Critical care time was exclusive of separately billable procedures and treating other patients.  Critical care was necessary to treat or prevent imminent or life-threatening deterioration.  Critical care was time spent personally by me on the following activities: development of treatment plan with patient and/or surrogate as well as nursing, discussions with consultants, evaluation of patient's response to treatment, examination of patient, obtaining history from patient or surrogate, ordering and performing treatments and interventions, ordering and review of laboratory studies,  ordering and review of radiographic studies, pulse oximetry and re-evaluation of patient's condition.   Length of Stay: 1  Loralie Champagne, MD  10/07/2019, 11:07 AM  Advanced Heart Failure Team Pager [phone] (M-F; 7a - 4p)  Please contact [facility] for night-coverage after hours (4p -7a ) and weekends on [email or web address]

## 2019-10-07 NOTE — Progress Notes (Signed)
ANTICOAGULATION CONSULT NOTE - Follow Up Consult  Pharmacy Consult for heparin Indication: chest pain/ACS  No Known Allergies  Patient Measurements: Height: 5\' 8"  (172.7 cm) Weight: 177 lb 7.5 oz (80.5 kg) IBW/kg (Calculated) : 68.4  Vital Signs: Temp: 97.9 F (36.6 C) (10/31 1100) Temp Source: Oral (10/31 0751) BP: 166/42 (10/31 1100) Pulse Rate: 78 (10/31 1100)  Labs: Recent Labs    10/06/19 0957 10/06/19 1203 10/06/19 1725 10/06/19 1958 10/07/19 0231  HGB 9.7*  --   --   --  9.2*  HCT 32.8*  --   --   --  29.7*  PLT 284  --   --   --  307  LABPROT 65.3*  --   --   --  29.2*  INR 8.0*  --   --   --  2.8*  CREATININE 1.64*  --   --   --  1.37*  TROPONINIHS 389* 644* 919* 935*  --     Estimated Creatinine Clearance: 45.8 mL/min (A) (by C-G formula based on SCr of 1.37 mg/dL (H)).   Medical History: Past Medical History:  Diagnosis Date  . Adenomatous colon polyp 09/1997  . Anemia   . Aortic stenosis    s/p st[facility] jude mechanical AVR - Chronic Coumadin  . Blood transfusion    "related to ITP"  . Coronary artery disease    s/p cabg x 3 11/2003: lima-lad, seq vg to rpda and rpl  . Diverticulitis of colon   . Heart murmur   . Hyperlipidemia   . Hypertension   . ITP (idiopathic thrombocytopenic purpura)   . Peripheral arterial disease ([place])    a. history of aortobifemoral bypass grafting by Dr. Sherren Mocha early b. LE angiography 04/22/2015 patent aortobifem graft, DES to R SFA  . Peripheral vascular disease ([place])    s/p Left external Iliac Artery stenting and subsequent left femoral endarterectomy 02/2011- post op course complicated by wound infxn req I&D 03/2011  . Renal artery stenosis, native, bilateral (HCC)    a. bilateral renal artery stenosis by recent duplex ultrasound b. L renal artery stent 02/2015, R renal artery patent on angiogram  . Stroke ([place])   . TIA (transient ischemic attack) ~ 2013  . Type II diabetes mellitus (HCC)     Medications:  Medications  Prior to Admission  Medication Sig Dispense Refill Last Dose  . amLODipine (NORVASC) 10 MG tablet Take 10 mg by mouth daily.  5 10/06/2019 at Unknown time  . aspirin EC 81 MG EC tablet Take 1 tablet (81 mg total) by mouth daily. (Patient taking differently: Take 324 mg by mouth as needed. ) 90 tablet 3 10/06/2019 at Unknown time  . atorvastatin (LIPITOR) 80 MG tablet Take 80 mg by mouth daily.    10/06/2019 at Unknown time  . canagliflozin (INVOKANA) 300 MG TABS tablet Take 300 mg by mouth daily.    10/06/2019 at Unknown time  . enoxaparin (LOVENOX) 80 MG/0.8ML injection Inject 0.8 mLs (80 mg total) into the skin every 12 (twelve) hours. 20 mL 1 Past Month at Unknown time  . Evolocumab (REPATHA SURECLICK) 381 MG/ML SOAJ Inject 140 mg into the skin every 14 (fourteen) days.   Past Month at Unknown time  . furosemide (LASIX) 20 MG tablet Take 1 tablet (20 mg total) by mouth 2 (two) times daily. 180 tablet 3 10/06/2019 at Unknown time  . glyBURIDE (DIABETA) 2.5 MG tablet Take 5 mg by mouth 2 (two) times daily with a meal.  10/06/2019 at Unknown time  . isosorbide mononitrate (IMDUR) 30 MG 24 hr tablet Take 1 tablet (30 mg total) by mouth daily. 90 tablet 3 10/06/2019 at Unknown time  . metoprolol succinate (TOPROL-XL) 100 MG 24 hr tablet Take 1 tablet (100 mg total) by mouth daily. Take with or immediately following a meal. 90 tablet 3 10/06/2019 at 0700  . nitroGLYCERIN (NITROSTAT) 0.4 MG SL tablet Place 1 tablet (0.4 mg total) under the tongue every 5 (five) minutes x 3 doses as needed for chest pain. 25 tablet 12 unk  . pantoprazole (PROTONIX) 40 MG tablet Take 40 mg by mouth daily.   10/06/2019 at Unknown time  . trandolapril (MAVIK) 1 MG tablet Take 1 tablet (1 mg total) by mouth daily. 90 tablet 3 10/06/2019 at Unknown time  . warfarin (COUMADIN) 10 MG tablet Take 5 mg on Mon/Wed/Fri and 10 mg on Tue/Thur/Sat/Sun. (Patient taking differently: Take 5-10 mg by mouth See admin instructions. Take 5  mg on Mon/Wed/Fri/Sat and 10 mg on Tue/Thur/Sun.) 35 tablet 3 10/05/2019 at 0900   Scheduled:  . aspirin EC  81 mg Oral Daily  . atorvastatin  80 mg Oral Daily  . carvedilol  3.125 mg Oral BID WC  . Chlorhexidine Gluconate Cloth  6 each Topical Daily  . furosemide  60 mg Intravenous BID  . [START ON 10/08/2019] isosorbide mononitrate  60 mg Oral Daily  . pantoprazole  40 mg Oral Daily  . sodium chloride flush  3 mL Intravenous Once  . spironolactone  12.5 mg Oral Daily    Assessment: Pt is a 1 t/o male who presents with complains of shortness of breath and chest pain. Recent cath showed occluded proxLAD and 99% distal left main stenosis but was not deemed a good candidate for PCI. Medical management only.   Patient is on warfarin PTA for mechanical AVR (INR goal 2.5-3.5). Pharmacy has been consulted to dose heparin when INR < 2  Pt's PTA warfarin dose: 5 mg daily except 10 mg on TuThSu.  Pt's INR today is 2.8 s/p vitamin K 5 mg IV on 10/30. LFTs increasing. No signs/sympotms of bleeding. Hg/Hct are low but stable at 9.2/29.2. Troponins are elevated at 289>935.  Goal of Therapy:  Heparin level 0.3-0.7 units/ml Monitor platelets by anticoagulation protocol: Yes   Plan:  Start IV heparin once INR < 2  Hold warfarin pending procedures Daily INR, CBC Monitor for bleeding  Vertis Kelch, PharmD PGY2 Cardiology Pharmacy Resident Phone [phone] 10/07/2019       12:29 PM  Please check [email or web address] for unit-specific pharmacist phone numbers

## 2019-10-08 LAB — BASIC METABOLIC PANEL
Anion gap: 13 (ref 5–15)
BUN: 25 mg/dL — ABNORMAL HIGH (ref 8–23)
CO2: 27 mmol/L (ref 22–32)
Calcium: 8.3 mg/dL — ABNORMAL LOW (ref 8.9–10.3)
Chloride: 95 mmol/L — ABNORMAL LOW (ref 98–111)
Creatinine, Ser: 1.02 mg/dL (ref 0.61–1.24)
GFR calc Af Amer: >60 mL/min (ref >60)
GFR calc non Af Amer: >60 mL/min (ref >60)
Glucose, Bld: 226 mg/dL — ABNORMAL HIGH (ref 70–99)
Potassium: 3.1 mmol/L — ABNORMAL LOW (ref 3.5–5.1)
Sodium: 135 mmol/L (ref 135–145)

## 2019-10-08 LAB — COOXEMETRY PANEL
Carboxyhemoglobin: 1.7 % — ABNORMAL HIGH (ref 0.5–1.5)
Methemoglobin: 1.4 % (ref 0.0–1.5)
O2 Saturation: 66.7 %
Total hemoglobin: 8.8 g/dL — ABNORMAL LOW (ref 12.0–16.0)

## 2019-10-08 LAB — IRON AND TIBC
Iron: 8 ug/dL — ABNORMAL LOW (ref 45–182)
Saturation Ratios: 2 % — ABNORMAL LOW (ref 17.9–39.5)
TIBC: 433 ug/dL (ref 250–450)
UIBC: 425 ug/dL

## 2019-10-08 LAB — CBC
HCT: 29 % — ABNORMAL LOW (ref 39.0–52.0)
Hemoglobin: 8.7 g/dL — ABNORMAL LOW (ref 13.0–17.0)
MCH: 23.6 pg — ABNORMAL LOW (ref 26.0–34.0)
MCHC: 30 g/dL (ref 30.0–36.0)
MCV: 78.8 fL — ABNORMAL LOW (ref 80.0–100.0)
Platelets: 308 10*3/uL (ref 150–400)
RBC: 3.68 MIL/uL — ABNORMAL LOW (ref 4.22–5.81)
RDW: 23.9 % — ABNORMAL HIGH (ref 11.5–15.5)
WBC: 9.7 10*3/uL (ref 4.0–10.5)
nRBC: 13.8 % — ABNORMAL HIGH (ref 0.0–0.2)

## 2019-10-08 LAB — HEPATIC FUNCTION PANEL
ALT: 766 U/L — ABNORMAL HIGH (ref 0–44)
AST: 349 U/L — ABNORMAL HIGH (ref 15–41)
Albumin: 3.2 g/dL — ABNORMAL LOW (ref 3.5–5.0)
Alkaline Phosphatase: 75 U/L (ref 38–126)
Bilirubin, Direct: 0.2 mg/dL (ref 0.0–0.2)
Indirect Bilirubin: 0.6 mg/dL (ref 0.3–0.9)
Total Bilirubin: 0.8 mg/dL (ref 0.3–1.2)
Total Protein: 5.8 g/dL — ABNORMAL LOW (ref 6.5–8.1)

## 2019-10-08 LAB — GLUCOSE, CAPILLARY
Glucose-Capillary: 344 mg/dL — ABNORMAL HIGH (ref 70–99)
Glucose-Capillary: 212 mg/dL — ABNORMAL HIGH (ref 70–99)
Glucose-Capillary: 310 mg/dL — ABNORMAL HIGH (ref 70–99)

## 2019-10-08 LAB — HEMOGLOBIN A1C
Hgb A1c MFr Bld: 8.2 % — ABNORMAL HIGH (ref 4.8–5.6)
Mean Plasma Glucose: 188.64 mg/dL

## 2019-10-08 LAB — PROTIME-INR
INR: 1.5 — ABNORMAL HIGH (ref 0.8–1.2)
Prothrombin Time: 17.9 seconds — ABNORMAL HIGH (ref 11.4–15.2)

## 2019-10-08 LAB — HEPARIN LEVEL (UNFRACTIONATED): Heparin Unfractionated: <0.1 IU/mL — ABNORMAL LOW (ref 0.30–0.70)

## 2019-10-08 LAB — FERRITIN: Ferritin: 27 ng/mL (ref 24–336)

## 2019-10-08 LAB — MAGNESIUM: Magnesium: 1.9 mg/dL (ref 1.7–2.4)

## 2019-10-08 MED ORDER — HEPARIN (PORCINE) 25000 UT/250ML-% IV SOLN
1450.0000 [IU]/h | INTRAVENOUS | Status: DC
  Administered 2019-10-08: 1000 [IU]/h via INTRAVENOUS
  Administered 2019-10-09: 1400 [IU]/h via INTRAVENOUS
  Administered 2019-10-09 – 2019-10-10 (×2): 1450 [IU]/h via INTRAVENOUS
  Filled 2019-10-08 (×4): qty 250

## 2019-10-08 MED ORDER — HEPARIN BOLUS VIA INFUSION
4000.0000 [IU] | Freq: Once | INTRAVENOUS | Status: AC
  Administered 2019-10-08: 4000 [IU] via INTRAVENOUS
  Filled 2019-10-08: qty 4000

## 2019-10-08 MED ORDER — POTASSIUM CHLORIDE CRYS ER 20 MEQ PO TBCR
40.0000 meq | EXTENDED_RELEASE_TABLET | ORAL | Status: AC
  Administered 2019-10-08 (×2): 40 meq via ORAL
  Filled 2019-10-08 (×2): qty 2

## 2019-10-08 MED ORDER — HEPARIN BOLUS VIA INFUSION
2000.0000 [IU] | Freq: Once | INTRAVENOUS | Status: AC
  Administered 2019-10-08: 2000 [IU] via INTRAVENOUS
  Filled 2019-10-08: qty 2000

## 2019-10-08 MED ORDER — LOSARTAN POTASSIUM 25 MG PO TABS
25.0000 mg | ORAL_TABLET | Freq: Every day | ORAL | Status: DC
  Administered 2019-10-08: 25 mg via ORAL
  Filled 2019-10-08: qty 1

## 2019-10-08 MED ORDER — POTASSIUM CHLORIDE CRYS ER 20 MEQ PO TBCR: 30.0000 meq | EXTENDED_RELEASE_TABLET | ORAL | Status: DC

## 2019-10-08 MED ORDER — INSULIN ASPART 100 UNIT/ML ~~LOC~~ SOLN
0.0000 [IU] | SUBCUTANEOUS | Status: DC
  Administered 2019-10-08: 7 [IU] via SUBCUTANEOUS
  Administered 2019-10-08: 3 [IU] via SUBCUTANEOUS
  Administered 2019-10-08: 17:00:00 2 [IU] via SUBCUTANEOUS
  Administered 2019-10-08: 7 [IU] via SUBCUTANEOUS
  Administered 2019-10-09: 5 [IU] via SUBCUTANEOUS
  Administered 2019-10-09: 08:00:00 3 [IU] via SUBCUTANEOUS
  Administered 2019-10-09: 7 [IU] via SUBCUTANEOUS
  Administered 2019-10-09: 3 [IU] via SUBCUTANEOUS
  Administered 2019-10-09: 1 [IU] via SUBCUTANEOUS
  Administered 2019-10-09: 3 [IU] via SUBCUTANEOUS
  Administered 2019-10-10 (×4): 2 [IU] via SUBCUTANEOUS
  Administered 2019-10-10: 1 [IU] via SUBCUTANEOUS
  Administered 2019-10-11: 9 [IU] via SUBCUTANEOUS
  Administered 2019-10-11: 7 [IU] via SUBCUTANEOUS
  Administered 2019-10-11: 5 [IU] via SUBCUTANEOUS
  Administered 2019-10-11: 9 [IU] via SUBCUTANEOUS

## 2019-10-08 MED ORDER — MAGNESIUM SULFATE 2 GM/50ML IV SOLN
2.0000 g | Freq: Once | INTRAVENOUS | Status: AC
  Administered 2019-10-08: 2 g via INTRAVENOUS
  Filled 2019-10-08: qty 50

## 2019-10-08 MED ORDER — POTASSIUM CHLORIDE CRYS ER 20 MEQ PO TBCR
40.0000 meq | EXTENDED_RELEASE_TABLET | Freq: Once | ORAL | Status: AC
  Administered 2019-10-08: 40 meq via ORAL
  Filled 2019-10-08: qty 2

## 2019-10-08 MED ORDER — SPIRONOLACTONE 25 MG PO TABS
25.0000 mg | ORAL_TABLET | Freq: Every day | ORAL | Status: DC
  Administered 2019-10-08 – 2019-10-13 (×6): 25 mg via ORAL
  Filled 2019-10-08 (×6): qty 1

## 2019-10-08 NOTE — Plan of Care (Signed)

## 2019-10-08 NOTE — Progress Notes (Addendum)
Patient ID: [name] [name], male   DOB: 1946-03-28, 74 y.o.   MRN: [id number]     Advanced Heart Failure Rounding Note  PCP-Cardiologist: Jenkins Rouge, MD   Subjective:    He is diuresing well on IV Lasix, feeling better, weight down 3 lbs.  CVP 15 today.  Co-ox 67%.   No chest pain.  Got short of breath moving from bed to chair.   Hs-TnI 398 => 644 => 935  On amiodarone, no further NSVT.    BP stable, creatinine trending down at 1.02 today.  LFTs trending down.   He got vitamin K initially, INR 1.5 today. No overt GI bleeding though hgb lower at 8.7.   Echo: EF 30-35%,  Anterior and anteroseptal hypokinesis, basal-mid inferolateral and anterolateral hypokinesis, normal RV, mechanical aortic valve looks ok, dilated IVC.    Objective:   Weight Range: 79.2 kg Body mass index is 26.55 kg/m.   Vital Signs:   Temp:  [97.8 F (36.6 C)-98.6 F (37 C)] 98.3 F (36.8 C) (11/01 0400) Pulse Rate:  [46-92] 76 (11/01 0700) Resp:  [14-27] 21 (11/01 0700) BP: (103-179)/(34-120) 165/48 (11/01 0700) SpO2:  [91 %-97 %] 93 % (11/01 0700) Weight:  [79.2 kg] 79.2 kg (11/01 0600) Last BM Date: 10/06/19  Weight change: Filed Weights   10/07/19 0600 10/08/19 0600  Weight: 80.5 kg 79.2 kg    Intake/Output:   Intake/Output Summary (Last 24 hours) at 10/08/2019 0803 Last data filed at 10/08/2019 0700 Gross per 24 hour  Intake 1617.03 ml  Output 5550 ml  Net -3932.97 ml      Physical Exam    General: NAD Neck: JVP 14+ cm, no thyromegaly or thyroid nodule.  Lungs: Clear to auscultation bilaterally with normal respiratory effort. CV: Nondisplaced PMI.  Heart regular S1/S2 with mechanical S2, no S3/S4, 2/6 SEM RUSB.  1+ edema 1/2 to knees bilaterally.   Abdomen: Soft, nontender, no hepatosplenomegaly, no distention.  Skin: Intact without lesions or rashes.  Neurologic: Alert and oriented x 3.  Psych: Normal affect. Extremities: No clubbing or cyanosis.  HEENT: Normal.     Telemetry   NSR with occasional PVCs.  Personally reviewed.   EKG    NSR, inferolateral ST depression/T wave inversions concerning for ischemia.    Labs    CBC Recent Labs    10/07/19 0231 10/08/19 0244  WBC 8.0 9.7  HGB 9.2* 8.7*  HCT 29.7* 29.0*  MCV 76.9* 78.8*  PLT 307 007   Basic Metabolic Panel Recent Labs    10/06/19 1725 10/07/19 0231 10/08/19 0244  NA  --  136 135  K  --  3.8 3.1*  CL  --  99 95*  CO2  --  23 27  GLUCOSE  --  212* 226*  BUN  --  33* 25*  CREATININE  --  1.37* 1.02  CALCIUM  --  8.5* 8.3*  MG 1.9  --  1.9   Liver Function Tests Recent Labs    10/07/19 0231 10/08/19 0244  AST 992* 349*  ALT 1,017* 766*  ALKPHOS 77 75  BILITOT 0.6 0.8  PROT 5.8* 5.8*  ALBUMIN 3.3* 3.2*   No results for input(s): LIPASE, AMYLASE in the last 72 hours. Cardiac Enzymes No results for input(s): CKTOTAL, CKMB, CKMBINDEX, TROPONINI in the last 72 hours.  BNP: BNP (last 3 results) Recent Labs    09/16/19 1008 10/06/19 0958  BNP 676.4* 899.1*    ProBNP (last 3 results) Recent Labs  10/02/19 1131  PROBNP 3,116*     D-Dimer No results for input(s): DDIMER in the last 72 hours. Hemoglobin A1C No results for input(s): HGBA1C in the last 72 hours. Fasting Lipid Panel Recent Labs    10/07/19 0231  CHOL 74  HDL 45  LDLCALC 20  TRIG 46  CHOLHDL 1.6   Thyroid Function Tests Recent Labs    10/06/19 1958  TSH 2.798    Other results:   Imaging    [place] Ekg Site Rite  Result Date: 10/07/2019 If Site Rite image not attached, placement could not be confirmed due to current cardiac rhythm.    Medications:     Scheduled Medications: . aspirin EC  81 mg Oral Daily  . atorvastatin  80 mg Oral Daily  . carvedilol  3.125 mg Oral BID WC  . Chlorhexidine Gluconate Cloth  6 each Topical Daily  . furosemide  60 mg Intravenous BID  . isosorbide mononitrate  60 mg Oral Daily  . losartan  25 mg Oral Daily  . pantoprazole  40 mg Oral  Daily  . potassium chloride  40 mEq Oral Q4H  . potassium chloride  40 mEq Oral Once  . sodium chloride flush  10-40 mL Intracatheter Q12H  . sodium chloride flush  3 mL Intravenous Once  . spironolactone  25 mg Oral Daily    Infusions: . amiodarone 30 mg/hr (10/08/19 0003)    PRN Medications: acetaminophen, nitroGLYCERIN, ondansetron (ZOFRAN) IV, sodium chloride flush    Assessment/Plan   1. Acute systolic CHF: Ischemic cardiomyopathy.  Echo this admission with EF 30-35% and wall motion abnormalities, prior echo earlier this month showed EF 50-55%.  Cath earlier this month showed jeopardized ramus and LCx territory (99% distal left main, occluded LAD, patent LIMA-LAD).  I am concerned that ischemia in this territory has triggered CHF.  Echo showed lateral wall hypokinesis.  With elevated LFTs and creatinine at admission, I am concerned for low output/cardiogenic shock physiology.  Creatinine now better.  He diuresed well yesterday, still volume overloaded with CVP 15 today.  Co-ox good at 67%.  - Continue Lasix 60 mg IV bid today and replace K.   - Increase spironolactone to 25 mg daily.  - Add losartan 25 mg daily with improved creatinine.  - Continue Coreg 3.125 mg bid.  2. CAD: Complicated disease, coronary angiography earlier this month showed patent SVG-RCA territory and patent LIMA-LAD.  The proximal LAD was occluded and there was 99% distal left main stenosis.  This left the ramus and LCx in jeopardy.  He was not a good candidate for protected PCI => cannot place Impella with mechanical aortic valve and peripheral vascular disease likely precludes a femoral IABP.  It was decided to try to manage him medically.  However, he returns with progressive chest pain episodes and NSTEMI, hs-TnI up to 935.  Now chest pain-free at rest.   - INR 1.5, start heparin gtt.    - Continue ASA 81 and atorvastatin 80 daily.  - Continue Imdur 60 daily.  - Will discuss with interventional colleagues.   At this point, he is failing medical management and will need intervention on distal left main.  I spoke with Dr. Kipp Brood regarding placement of axillary IABP protection for PCI, he suggested considering VA ECMO via axillary during PCI.  3. Mechanical aortic valve: Stable on echo this admission.  - Continue ASA 81.  - Warfarin on hold, INR 1.5, start heparin gtt.  4. NSVT: Frequent runs NSVT  yesterday, amiodarone gtt started.  NSVT seems to have resolved. Likely due to ischemia.  - Continue amiodarone gtt.  - Replete K and Mg.  5. H/o GI bleeding: Hgb 8.7 today, no overt bleeding.  INR 8 initially, now improved.   - Will send Fe studies, may need IV Fe.  6. PAD: Patient with aorto-bifemoral bypass, h/o iliac stenting, h/o femoral endarterectomy, PCI to right SFA in 2017.  - He would not be able to get femoral IABP.  If needed, axillary may be our best option.  7. H/o renal artery stenosis.  8. AKI: Concern for cardiorenal syndrome.  Creatinine better this morning at 1.05.  Follow closely.   9. Elevated LFTs: Shock liver versus congestive hepatopathy.  LFTs trending down today. I do not think that amiodarone is the cause (elevated LFTs prior to amiodarone).  - Continue IV Lasix.  10. DM2: cover with sliding scale.    CRITICAL CARE Performed by: Loralie Champagne  Total critical care time: 35 minutes  Critical care time was exclusive of separately billable procedures and treating other patients.  Critical care was necessary to treat or prevent imminent or life-threatening deterioration.  Critical care was time spent personally by me on the following activities: development of treatment plan with patient and/or surrogate as well as nursing, discussions with consultants, evaluation of patient's response to treatment, examination of patient, obtaining history from patient or surrogate, ordering and performing treatments and interventions, ordering and review of laboratory studies, ordering and  review of radiographic studies, pulse oximetry and re-evaluation of patient's condition.   Length of Stay: 2  Loralie Champagne, MD  10/08/2019, 8:03 AM  Advanced Heart Failure Team Pager [phone] (M-F; 7a - 4p)  Please contact [facility] for night-coverage after hours (4p -7a ) and weekends on [email or web address]  Reviewed with Dr. Burt Knack, he will plan on PCI with atherectomy on Tuesday.  He thinks that case can be done without support so will not plan on axillary IABP or VA ECMO.   Loralie Champagne 10/08/2019 10:01 AM

## 2019-10-08 NOTE — Progress Notes (Signed)
ANTICOAGULATION CONSULT NOTE - Follow Up Consult  Pharmacy Consult for heparin Indication: chest pain/ACS  No Known Allergies  Patient Measurements: Height: 5\' 8"  (172.7 cm) Weight: 174 lb 9.6 oz (79.2 kg) IBW/kg (Calculated) : 68.4 Heparin dosing weight: 79 kg  Vital Signs: Temp: 98.3 F (36.8 C) (11/01 0400) Temp Source: Oral (11/01 0400) BP: 165/48 (11/01 0700) Pulse Rate: 76 (11/01 0700)  Labs: Recent Labs    10/06/19 0957 10/06/19 1203 10/06/19 1725 10/06/19 1958 10/07/19 0231 10/08/19 0244  HGB 9.7*  --   --   --  9.2* 8.7*  HCT 32.8*  --   --   --  29.7* 29.0*  PLT 284  --   --   --  307 308  LABPROT 65.3*  --   --   --  29.2* 17.9*  INR 8.0*  --   --   --  2.8* 1.5*  CREATININE 1.64*  --   --   --  1.37* 1.02  TROPONINIHS 389* 644* 919* 935*  --   --     Estimated Creatinine Clearance: 61.5 mL/min (by C-G formula based on SCr of 1.02 mg/dL).   Medical History: Past Medical History:  Diagnosis Date  . Adenomatous colon polyp 09/1997  . Anemia   . Aortic stenosis    s/p st. jude mechanical AVR - Chronic Coumadin  . Blood transfusion    "related to ITP"  . Coronary artery disease    s/p cabg x 3 11/2003: lima-lad, seq vg to rpda and rpl  . Diverticulitis of colon   . Heart murmur   . Hyperlipidemia   . Hypertension   . ITP (idiopathic thrombocytopenic purpura)   . Peripheral arterial disease ([place])    a. history of aortobifemoral bypass grafting by Dr. Sherren Mocha early b. LE angiography 04/22/2015 patent aortobifem graft, DES to R SFA  . Peripheral vascular disease ([place])    s/p Left external Iliac Artery stenting and subsequent left femoral endarterectomy 02/2011- post op course complicated by wound infxn req I&D 03/2011  . Renal artery stenosis, native, bilateral (HCC)    a. bilateral renal artery stenosis by recent duplex ultrasound b. L renal artery stent 02/2015, R renal artery patent on angiogram  . Stroke ([place])   . TIA (transient ischemic attack) ~ 2013   . Type II diabetes mellitus ([facility])     Medications:  Medications Prior to Admission  Medication Sig Dispense Refill Last Dose  . amLODipine (NORVASC) 10 MG tablet Take 10 mg by mouth daily.  5 10/06/2019 at Unknown time  . aspirin EC 81 MG EC tablet Take 1 tablet (81 mg total) by mouth daily. (Patient taking differently: Take 324 mg by mouth as needed. ) 90 tablet 3 10/06/2019 at Unknown time  . atorvastatin (LIPITOR) 80 MG tablet Take 80 mg by mouth daily.    10/06/2019 at Unknown time  . canagliflozin (INVOKANA) 300 MG TABS tablet Take 300 mg by mouth daily.    10/06/2019 at Unknown time  . enoxaparin (LOVENOX) 80 MG/0.8ML injection Inject 0.8 mLs (80 mg total) into the skin every 12 (twelve) hours. 20 mL 1 Past Month at Unknown time  . Evolocumab (REPATHA SURECLICK) 387 MG/ML SOAJ Inject 140 mg into the skin every 14 (fourteen) days.   Past Month at Unknown time  . furosemide (LASIX) 20 MG tablet Take 1 tablet (20 mg total) by mouth 2 (two) times daily. 180 tablet 3 10/06/2019 at Unknown time  . glyBURIDE (DIABETA) 2.5  MG tablet Take 5 mg by mouth 2 (two) times daily with a meal.    10/06/2019 at Unknown time  . isosorbide mononitrate (IMDUR) 30 MG 24 hr tablet Take 1 tablet (30 mg total) by mouth daily. 90 tablet 3 10/06/2019 at Unknown time  . metoprolol succinate (TOPROL-XL) 100 MG 24 hr tablet Take 1 tablet (100 mg total) by mouth daily. Take with or immediately following a meal. 90 tablet 3 10/06/2019 at 0700  . nitroGLYCERIN (NITROSTAT) 0.4 MG SL tablet Place 1 tablet (0.4 mg total) under the tongue every 5 (five) minutes x 3 doses as needed for chest pain. 25 tablet 12 unk  . pantoprazole (PROTONIX) 40 MG tablet Take 40 mg by mouth daily.   10/06/2019 at Unknown time  . trandolapril (MAVIK) 1 MG tablet Take 1 tablet (1 mg total) by mouth daily. 90 tablet 3 10/06/2019 at Unknown time  . warfarin (COUMADIN) 10 MG tablet Take 5 mg on Mon/Wed/Fri and 10 mg on Tue/Thur/Sat/Sun. (Patient  taking differently: Take 5-10 mg by mouth See admin instructions. Take 5 mg on Mon/Wed/Fri/Sat and 10 mg on Tue/Thur/Sun.) 35 tablet 3 10/05/2019 at 0900   Scheduled:  . aspirin EC  81 mg Oral Daily  . atorvastatin  80 mg Oral Daily  . carvedilol  3.125 mg Oral BID WC  . Chlorhexidine Gluconate Cloth  6 each Topical Daily  . furosemide  60 mg Intravenous BID  . isosorbide mononitrate  60 mg Oral Daily  . losartan  25 mg Oral Daily  . pantoprazole  40 mg Oral Daily  . potassium chloride  40 mEq Oral Q4H  . potassium chloride  40 mEq Oral Once  . sodium chloride flush  10-40 mL Intracatheter Q12H  . sodium chloride flush  3 mL Intravenous Once  . spironolactone  25 mg Oral Daily    Assessment: Pt is a 27 t/o male who presents with complains of shortness of breath and chest pain. Recent cath showed occluded proxLAD and 99% distal left main stenosis but was not deemed a good candidate for PCI. Medical management only.   Patient is on warfarin PTA for mechanical AVR (INR goal 2.5-3.5). Pharmacy has been consulted to dose heparin when INR < 2  Pt's PTA warfarin dose: 5 mg daily except 10 mg on TuThSu.  Pt's INR today is 1.5 s/p vitamin K 5 mg IV on 10/30. LFTs improving. No signs/sympotms of bleeding. Hg/Hct are low but stable at 8.7/29. Troponins are elevated at 935.  Goal of Therapy:  Heparin level 0.3-0.7 units/ml Monitor platelets by anticoagulation protocol: Yes   Plan:  Heparin 4,000 unit x 1 bolus, then start heparin infusion at 1000 units/hr Check heparin level in 8 hours Hold warfarin pending procedures Daily INR, CBC, heparin level Monitor for bleeding  Vertis Kelch, PharmD PGY2 Cardiology Pharmacy Resident Phone [phone] 10/08/2019       8:08 AM  Please check [email or web address] for unit-specific pharmacist phone numbers

## 2019-10-08 NOTE — Progress Notes (Signed)
ANTICOAGULATION CONSULT NOTE  Pharmacy Consult for heparin Indication: chest pain/ACS  No Known Allergies  Patient Measurements: Height: 5\' 8"  (172.7 cm) Weight: 174 lb 9.6 oz (79.2 kg) IBW/kg (Calculated) : 68.4 Heparin dosing weight: 79 kg  Vital Signs: Temp: 97.9 F (36.6 C) (11/01 1600) Temp Source: Oral (11/01 1200) BP: 126/38 (11/01 2000) Pulse Rate: 114 (11/01 2000)  Labs: Recent Labs    10/06/19 0957 10/06/19 1203 10/06/19 1725 10/06/19 1958 10/07/19 0231 10/08/19 0244 10/08/19 1635  HGB 9.7*  --   --   --  9.2* 8.7*  --   HCT 32.8*  --   --   --  29.7* 29.0*  --   PLT 284  --   --   --  307 308  --   LABPROT 65.3*  --   --   --  29.2* 17.9*  --   INR 8.0*  --   --   --  2.8* 1.5*  --   HEPARINUNFRC  --   --   --   --   --   --  <0.10*  CREATININE 1.64*  --   --   --  1.37* 1.02  --   TROPONINIHS 389* 644* 919* 935*  --   --   --     Estimated Creatinine Clearance: 61.5 mL/min (by C-G formula based on SCr of 1.02 mg/dL).   Assessment: Pt is a 62 t/o male who presents with complains of shortness of breath and chest pain. Recent cath showed occluded proxLAD and 99% distal left main stenosis but was not deemed a good candidate for PCI. Medical management only.   Patient is on warfarin PTA for mechanical AVR (INR goal 2.5-3.5). Pharmacy has been consulted to dose heparin with INR < 2.  Heparin level is undetectable.  No issue with heparin infusion per RN.  Goal of Therapy:  Heparin level 0.3-0.7 units/ml Monitor platelets by anticoagulation protocol: Yes   Plan:  Rebolus with heparin 2000 units IV, then Increase heparin gtt to 1400 units/hr Check 8 hr heparin level  Kyrra Prada D. Mina Marble, PharmD, BCPS, [place] 10/08/2019, 9:15 PM

## 2019-10-09 LAB — BASIC METABOLIC PANEL
Anion gap: 10 (ref 5–15)
BUN: 34 mg/dL — ABNORMAL HIGH (ref 8–23)
CO2: 26 mmol/L (ref 22–32)
Calcium: 8.2 mg/dL — ABNORMAL LOW (ref 8.9–10.3)
Chloride: 97 mmol/L — ABNORMAL LOW (ref 98–111)
Creatinine, Ser: 1.42 mg/dL — ABNORMAL HIGH (ref 0.61–1.24)
GFR calc Af Amer: 56 mL/min — ABNORMAL LOW (ref >60)
GFR calc non Af Amer: 48 mL/min — ABNORMAL LOW (ref >60)
Glucose, Bld: 216 mg/dL — ABNORMAL HIGH (ref 70–99)
Potassium: 4 mmol/L (ref 3.5–5.1)
Sodium: 133 mmol/L — ABNORMAL LOW (ref 135–145)

## 2019-10-09 LAB — COOXEMETRY PANEL
Carboxyhemoglobin: 1.5 % (ref 0.5–1.5)
Methemoglobin: 1.4 % (ref 0.0–1.5)
O2 Saturation: 58.1 %
Total hemoglobin: 8.5 g/dL — ABNORMAL LOW (ref 12.0–16.0)

## 2019-10-09 LAB — CBC
HCT: 27.4 % — ABNORMAL LOW (ref 39.0–52.0)
Hemoglobin: 8.2 g/dL — ABNORMAL LOW (ref 13.0–17.0)
MCH: 23.4 pg — ABNORMAL LOW (ref 26.0–34.0)
MCHC: 29.9 g/dL — ABNORMAL LOW (ref 30.0–36.0)
MCV: 78.1 fL — ABNORMAL LOW (ref 80.0–100.0)
Platelets: 282 10*3/uL (ref 150–400)
RBC: 3.51 MIL/uL — ABNORMAL LOW (ref 4.22–5.81)
RDW: 24.3 % — ABNORMAL HIGH (ref 11.5–15.5)
WBC: 7.4 10*3/uL (ref 4.0–10.5)
nRBC: 20.7 % — ABNORMAL HIGH (ref 0.0–0.2)

## 2019-10-09 LAB — OCCULT BLOOD X 1 CARD TO LAB, STOOL: Fecal Occult Bld: NEGATIVE

## 2019-10-09 LAB — HEPATIC FUNCTION PANEL
ALT: 619 U/L — ABNORMAL HIGH (ref 0–44)
AST: 214 U/L — ABNORMAL HIGH (ref 15–41)
Albumin: 2.8 g/dL — ABNORMAL LOW (ref 3.5–5.0)
Alkaline Phosphatase: 72 U/L (ref 38–126)
Bilirubin, Direct: 0.1 mg/dL (ref 0.0–0.2)
Indirect Bilirubin: 0.7 mg/dL (ref 0.3–0.9)
Total Bilirubin: 0.8 mg/dL (ref 0.3–1.2)
Total Protein: 5.4 g/dL — ABNORMAL LOW (ref 6.5–8.1)

## 2019-10-09 LAB — GLUCOSE, CAPILLARY
Glucose-Capillary: 145 mg/dL — ABNORMAL HIGH (ref 70–99)
Glucose-Capillary: 163 mg/dL — ABNORMAL HIGH (ref 70–99)
Glucose-Capillary: 180 mg/dL — ABNORMAL HIGH (ref 70–99)
Glucose-Capillary: 214 mg/dL — ABNORMAL HIGH (ref 70–99)
Glucose-Capillary: 239 mg/dL — ABNORMAL HIGH (ref 70–99)
Glucose-Capillary: 240 mg/dL — ABNORMAL HIGH (ref 70–99)
Glucose-Capillary: 257 mg/dL — ABNORMAL HIGH (ref 70–99)
Glucose-Capillary: 303 mg/dL — ABNORMAL HIGH (ref 70–99)

## 2019-10-09 LAB — PROTIME-INR
INR: 1.7 — ABNORMAL HIGH (ref 0.8–1.2)
Prothrombin Time: 19.3 seconds — ABNORMAL HIGH (ref 11.4–15.2)

## 2019-10-09 LAB — HEPARIN LEVEL (UNFRACTIONATED)
Heparin Unfractionated: 0.3 IU/mL (ref 0.30–0.70)
Heparin Unfractionated: 0.51 IU/mL (ref 0.30–0.70)

## 2019-10-09 LAB — MAGNESIUM: Magnesium: 2.1 mg/dL (ref 1.7–2.4)

## 2019-10-09 MED ORDER — FUROSEMIDE 10 MG/ML IJ SOLN
20.0000 mg | Freq: Once | INTRAMUSCULAR | Status: AC
  Administered 2019-10-09: 09:00:00 20 mg via INTRAVENOUS

## 2019-10-09 MED ORDER — ASPIRIN 81 MG PO CHEW
81.0000 mg | CHEWABLE_TABLET | ORAL | Status: AC
  Administered 2019-10-10: 81 mg via ORAL
  Filled 2019-10-09: qty 1

## 2019-10-09 MED ORDER — SODIUM CHLORIDE 0.9% FLUSH: 3.0000 mL | INTRAVENOUS | Status: DC | PRN

## 2019-10-09 MED ORDER — FUROSEMIDE 10 MG/ML IJ SOLN: 80.0000 mg | Freq: Two times a day (BID) | INTRAMUSCULAR | Status: DC

## 2019-10-09 MED ORDER — DIGOXIN 125 MCG PO TABS
0.1250 mg | ORAL_TABLET | Freq: Every day | ORAL | Status: DC
  Administered 2019-10-09 – 2019-10-17 (×9): 0.125 mg via ORAL
  Filled 2019-10-09 (×9): qty 1

## 2019-10-09 MED ORDER — SODIUM CHLORIDE 0.9 % IV SOLN
INTRAVENOUS | Status: DC
  Administered 2019-10-10: 06:00:00 via INTRAVENOUS

## 2019-10-09 MED ORDER — FUROSEMIDE 10 MG/ML IJ SOLN
80.0000 mg | Freq: Two times a day (BID) | INTRAMUSCULAR | Status: DC
  Administered 2019-10-09 – 2019-10-10 (×2): 80 mg via INTRAVENOUS
  Filled 2019-10-09 (×3): qty 8

## 2019-10-09 MED ORDER — INSULIN GLARGINE 100 UNIT/ML ~~LOC~~ SOLN
12.0000 [IU] | Freq: Every day | SUBCUTANEOUS | Status: DC
  Administered 2019-10-09 – 2019-10-10 (×2): 12 [IU] via SUBCUTANEOUS
  Filled 2019-10-09 (×3): qty 0.12

## 2019-10-09 MED ORDER — SODIUM CHLORIDE 0.9 % IV SOLN
510.0000 mg | INTRAVENOUS | Status: AC
  Administered 2019-10-09 – 2019-10-12 (×2): 510 mg via INTRAVENOUS
  Filled 2019-10-09 (×3): qty 17

## 2019-10-09 MED ORDER — SODIUM CHLORIDE 0.9 % IV SOLN: 250.0000 mL | INTRAVENOUS | Status: DC | PRN

## 2019-10-09 MED ORDER — SODIUM CHLORIDE 0.9% FLUSH
3.0000 mL | Freq: Two times a day (BID) | INTRAVENOUS | Status: DC
  Administered 2019-10-09 – 2019-10-14 (×5): 3 mL via INTRAVENOUS

## 2019-10-09 MED ORDER — FUROSEMIDE 10 MG/ML IJ SOLN
INTRAMUSCULAR | Status: AC
  Filled 2019-10-09: qty 2

## 2019-10-09 NOTE — Progress Notes (Signed)
ANTICOAGULATION CONSULT NOTE  Pharmacy Consult for heparin Indication: chest pain/ACS  No Known Allergies  Patient Measurements: Height: 5\' 8"  (172.7 cm) Weight: 176 lb 12.9 oz (80.2 kg) IBW/kg (Calculated) : 68.4 Heparin dosing weight: 79 kg  Vital Signs: Temp: 98.7 F (37.1 C) (11/02 0800) Temp Source: Oral (11/02 0800) BP: 111/44 (11/02 0900) Pulse Rate: 69 (11/02 0950)  Labs: Recent Labs    10/06/19 1725 10/06/19 1958  10/07/19 0231 10/08/19 0244 10/08/19 1635 10/09/19 0450 10/09/19 0505  HGB  --   --    < > 9.2* 8.7*  --  8.2*  --   HCT  --   --   --  29.7* 29.0*  --  27.4*  --   PLT  --   --   --  307 308  --  282  --   LABPROT  --   --   --  29.2* 17.9*  --  19.3*  --   INR  --   --   --  2.8* 1.5*  --  1.7*  --   HEPARINUNFRC  --   --   --   --   --  <0.10*  --  0.30  CREATININE  --   --   --  1.37* 1.02  --  1.42*  --   TROPONINIHS 919* 935*  --   --   --   --   --   --    < > = values in this interval not displayed.    Estimated Creatinine Clearance: 44.2 mL/min (A) (by C-G formula based on SCr of 1.42 mg/dL (H)).   Assessment: Pt is a 69 t/o male who presents with complains of shortness of breath and chest pain. Recent cath showed occluded proxLAD and 99% distal left main stenosis but was not deemed a good candidate for PCI. Medical management only.   Patient is on warfarin PTA for mechanical AVR (INR goal 2.5-3.5). Pharmacy has been consulted to dose heparin with INR < 2.  Heparin level on lower end of therapeutic at 0.3 after giving another heparin bolus and increasing rate to 1400 units/hr. Hgb low but stable at 8.2, plts wnl. No overt bleeding or infusion issues per RN. Will increase dose slightly to keep therapeutic.  Goal of Therapy:  Heparin level 0.3-0.7 units/ml Monitor platelets by anticoagulation protocol: Yes   Plan:  Increase heparin gtt to 1450 units/hr Check 8 hr heparin level Daily HL, CBC Monitor for bleeding  Richardine Service,  PharmD PGY1 Pharmacy Resident Phone: [phone] 10/09/2019  11:39 AM  Please check [email or web address] for unit-specific pharmacy phone numbers.

## 2019-10-09 NOTE — Progress Notes (Signed)
ANTICOAGULATION CONSULT NOTE  Pharmacy Consult for heparin Indication: chest pain/ACS  No Known Allergies  Patient Measurements: Height: 5\' 8"  (172.7 cm) Weight: 176 lb 12.9 oz (80.2 kg) IBW/kg (Calculated) : 68.4 Heparin dosing weight: 79 kg  Vital Signs: Temp: 98.7 F (37.1 C) (11/02 1611) Temp Source: Oral (11/02 1611) BP: 120/66 (11/02 1624) Pulse Rate: 69 (11/02 1624)  Labs: Recent Labs    10/06/19 1958  10/07/19 0231 10/08/19 0244 10/08/19 1635 10/09/19 0450 10/09/19 0505 10/09/19 1630  HGB  --    < > 9.2* 8.7*  --  8.2*  --   --   HCT  --   --  29.7* 29.0*  --  27.4*  --   --   PLT  --   --  307 308  --  282  --   --   LABPROT  --   --  29.2* 17.9*  --  19.3*  --   --   INR  --   --  2.8* 1.5*  --  1.7*  --   --   HEPARINUNFRC  --   --   --   --  <0.10*  --  0.30 0.51  CREATININE  --   --  1.37* 1.02  --  1.42*  --   --   TROPONINIHS 935*  --   --   --   --   --   --   --    < > = values in this interval not displayed.    Estimated Creatinine Clearance: 44.2 mL/min (A) (by C-G formula based on SCr of 1.42 mg/dL (H)).   Assessment: Patient is a 73 year old male who presented with shortness of breath and chest pain. Recent cath showed occluded proxLAD and 99% distal left main stenosis but was not deemed a good candidate for PCI. Medical management only.   Patient is on warfarin PTA for mechanical AVR (INR goal 2.5-3.5). Pharmacy has been consulted to dose heparin with INR < 2.  Heparin level this PM is therapeutic at 0.51 on 1450 units/hr. No overt bleeding or infusion issues per RN. .  Goal of Therapy:  Heparin level 0.3-0.7 units/ml Monitor platelets by anticoagulation protocol: Yes   Plan:  Continue heparin gtt to 1450 units/hr Daily HL, CBC Monitor for bleeding  Sloan Leiter, PharmD, BCPS, BCCCP Clinical Pharmacist Clinical phone 10/09/2019 until 10:30P - #[phone] Please refer to [facility] for [place] numbers 10/09/2019  6:11 PM

## 2019-10-09 NOTE — Progress Notes (Signed)
Patient's had sinus pauses followed by bradycardia with a heart rate of 25 beats per minute. RN checked on patient. Patient denies dizziness or fatigue. Other vital signs stable. On call cardiologist paged. Cardiologist stated to contact physician if patient is awake and having pauses for more than 4 - 5 seconds and/or if the patient is symptomatic. No other orders received.

## 2019-10-09 NOTE — Progress Notes (Signed)
Inpatient Diabetes Program Recommendations  AACE/ADA: New Consensus Statement on Inpatient Glycemic Control (2015)  Target Ranges:  Prepandial:   less than 140 mg/dL      Peak postprandial:   less than 180 mg/dL (1-2 hours)      Critically ill patients:  140 - 180 mg/dL   Lab Results  Component Value Date   GLUCAP 303 (H) 10/09/2019   HGBA1C 8.2 (H) 10/08/2019    Review of Glycemic Control Results for DIVINE, HANSLEY (MRN [id number]) as of 10/09/2019 12:32  Ref. Range 10/08/2019 21:03 10/09/2019 00:03 10/09/2019 04:14 10/09/2019 07:48 10/09/2019 12:04  Glucose-Capillary Latest Ref Range: 70 - 99 mg/dL 310 (H) 239 (H) 145 (H) 240 (H) 303 (H)   Diabetes history: DM2 Outpatient Diabetes medications: Glyburide 2.5 mg qd + Invokana 300 mg qd Current orders for Inpatient glycemic control: Novolog sensitive correction q 4 hrs.  Inpatient Diabetes Program Recommendations:   Noted patient is scheduled for PCI tomorrow am. Please consider while oral medications held: -Lantus 12 units (0.15 units/kg x 80.2 kg)  Thank you, Bethena Roys E. Lessly Stigler, RN, MSN, CDE  Diabetes Coordinator Inpatient Glycemic Control Team Team Pager [phone] (8am-5pm) 10/09/2019 1:04 PM

## 2019-10-09 NOTE — Progress Notes (Addendum)
Patient ID: [name] [name], male   DOB: 1946-02-09, 73 y.o.   MRN: [id number]     Advanced Heart Failure Rounding Note  PCP-Cardiologist: Jenkins Rouge, MD   Subjective:    On IV amiodarone for NSVT. Had 2 sinus pauses ~ 4am today, each 3.2 sec but completely asymptomatic. Overnight, he also went into atrial fibrillation. No significant recurrent NSVT on tele. K 4.0. Mg 2.1   Diuresing w/ IV Lasix, w/ an additional 2.2L out overnight. CVP 13 today.  Co-ox 58%. Bump in Scr overnight from 1.02>>1.42.   Denies CP. No resting dyspnea.  Out of bed and in chair this morning.   Hs-TnI 398 => 644 => 935  BP stable.  He got vitamin K initially (supratherapeutic INR on admit at 8.0), INR 1.7 today. No overt GI bleeding though hgb trending downward, 8.7>>8.2. had BM yesterday. No melena or hematochezia. Low transferrin saturation at 2%.   Echo: EF 30-35%,  Anterior and anteroseptal hypokinesis, basal-mid inferolateral and anterolateral hypokinesis, normal RV, mechanical aortic valve looks ok, dilated IVC.    Objective:   Weight Range: 80.2 kg Body mass index is 26.88 kg/m.   Vital Signs:   Temp:  [97.8 F (36.6 C)-98.7 F (37.1 C)] 98.7 F (37.1 C) (11/02 0747) Pulse Rate:  [54-115] 76 (11/02 0700) Resp:  [13-25] 17 (11/02 0700) BP: (91-155)/(37-85) 115/50 (11/02 0700) SpO2:  [90 %-100 %] 94 % (11/02 0700) Weight:  [80.2 kg] 80.2 kg (11/02 0657) Last BM Date: 10/06/19  Weight change: Filed Weights   10/07/19 0600 10/08/19 0600 10/09/19 0657  Weight: 80.5 kg 79.2 kg 80.2 kg    Intake/Output:   Intake/Output Summary (Last 24 hours) at 10/09/2019 0756 Last data filed at 10/09/2019 0700 Gross per 24 hour  Intake 1909.62 ml  Output 2200 ml  Net -290.38 ml      Physical Exam    General:  Well appearing elderly WM. No respiratory difficulty HEENT: normal Neck: supple. Elevated JVD. Carotids 2+ bilat; no bruits. No lymphadenopathy or thyromegaly appreciated. Cor: PMI  nondisplaced. Regular rate & rhythm. Crisp mechanical valve sounds Lungs: clear Abdomen: soft, nontender, nondistended. No hepatosplenomegaly. No bruits or masses. Good bowel sounds. Extremities: no cyanosis, clubbing, rash, 2+ bilateral LEE edema Neuro: alert & oriented x 3, cranial nerves grossly intact. moves all 4 extremities w/o difficulty. Affect pleasant.    Telemetry   Atrial fibrillation currently w/ HR in the 70s. 2 sinus pauses ~4Am today, up to 3.2 sec each. No recurrence.   EKG    N/a   Labs    CBC Recent Labs    10/08/19 0244 10/09/19 0450  WBC 9.7 PENDING  HGB 8.7* 8.2*  HCT 29.0* 27.4*  MCV 78.8* 78.1*  PLT 308 734   Basic Metabolic Panel Recent Labs    10/08/19 0244 10/09/19 0450  NA 135 133*  K 3.1* 4.0  CL 95* 97*  CO2 27 26  GLUCOSE 226* 216*  BUN 25* 34*  CREATININE 1.02 1.42*  CALCIUM 8.3* 8.2*  MG 1.9 2.1   Liver Function Tests Recent Labs    10/08/19 0244 10/09/19 0450  AST 349* 214*  ALT 766* 619*  ALKPHOS 75 72  BILITOT 0.8 0.8  PROT 5.8* 5.4*  ALBUMIN 3.2* 2.8*   No results for input(s): LIPASE, AMYLASE in the last 72 hours. Cardiac Enzymes No results for input(s): CKTOTAL, CKMB, CKMBINDEX, TROPONINI in the last 72 hours.  BNP: BNP (last 3 results) Recent Labs  09/16/19 1008 10/06/19 0958  BNP 676.4* 899.1*    ProBNP (last 3 results) Recent Labs    10/02/19 1131  PROBNP 3,116*     D-Dimer No results for input(s): DDIMER in the last 72 hours. Hemoglobin A1C Recent Labs    10/08/19 0853  HGBA1C 8.2*   Fasting Lipid Panel Recent Labs    10/07/19 0231  CHOL 74  HDL 45  LDLCALC 20  TRIG 46  CHOLHDL 1.6   Thyroid Function Tests Recent Labs    10/06/19 1958  TSH 2.798    Other results:   Imaging    No results found.   Medications:     Scheduled Medications: . aspirin EC  81 mg Oral Daily  . atorvastatin  80 mg Oral Daily  . carvedilol  3.125 mg Oral BID WC  . Chlorhexidine  Gluconate Cloth  6 each Topical Daily  . furosemide  60 mg Intravenous BID  . insulin aspart  0-9 Units Subcutaneous Q4H  . isosorbide mononitrate  60 mg Oral Daily  . losartan  25 mg Oral Daily  . pantoprazole  40 mg Oral Daily  . sodium chloride flush  10-40 mL Intracatheter Q12H  . sodium chloride flush  3 mL Intravenous Once  . spironolactone  25 mg Oral Daily    Infusions: . amiodarone 30 mg/hr (10/09/19 0700)  . heparin 1,400 Units/hr (10/09/19 0700)    PRN Medications: acetaminophen, nitroGLYCERIN, ondansetron (ZOFRAN) IV, sodium chloride flush    Assessment/Plan   1. Acute systolic CHF: Ischemic cardiomyopathy.  Echo this admission with EF 30-35% and wall motion abnormalities, prior echo earlier this month showed EF 50-55%.  Cath earlier this month showed jeopardized ramus and LCx territory (99% distal left main, occluded LAD, patent LIMA-LAD).  Concerned that ischemia in this territory has triggered CHF.  Echo showed lateral wall hypokinesis.  With elevated LFTs and creatinine at admission, there was initial concern for low output/cardiogenic shock physiology but initial co-ox was 67%. No inotrope requirements currently. Repeat co-ox today 58%.  Creatinine increasing, up from 1.02>>1.42.  He diuresed well yesterday but still volume overloaded with CVP at 13 today.   - Consider increasing Lasix to 80 mg IV bid today to increase diuresis and replace K as needed.   - Continue spironolactone to 25 mg daily.  - ? holding losartan today, given rise in SCr and need to be more aggressive w/ diuresis. Will defer to MD.  - Continue Coreg 3.125 mg bid.  2. CAD: Complicated disease, coronary angiography earlier this month showed patent SVG-RCA territory and patent LIMA-LAD.  The proximal LAD was occluded and there was 99% distal left main stenosis.  This left the ramus and LCx in jeopardy.  He was not a good candidate for protected PCI => cannot place Impella with mechanical aortic valve and  peripheral vascular disease likely precludes a femoral IABP.  It was decided to try to manage him medically.  However, he returns with progressive chest pain episodes and NSTEMI, hs-TnI up to 935.  Now chest pain-free at rest.    - INR 1.7, currently on heparin gtt with warfarin on hold.     - Continue ASA 81 and atorvastatin 80 daily.  - Continue Imdur 60 daily.  - At this point, he is failing medical management and will need intervention on distal left main. Discussed with Dr. Burt Knack, aiming for PCI tomorrow with atherectomy left main stenosis, LIMA protects the LAD.  3. Mechanical aortic valve: Stable on  echo this admission.  - Continue ASA 81.  - Warfarin on hold for anticipated cath w/ intervention, INR 1.7. Continue heparin gtt.  4. NSVT:amiodarone gtt started.  NSVT seems to have resolved. Likely due to ischemia. Had sinus pauses x 2 during sleep 11/2, up to 3.2 sec. No recurrence - Continue amiodarone gtt + coreg but monitor closely.  - Replete K and Mg as needed to keep K >4.0 and Mg >2.0.  5. H/o GI bleeding: Hgb trending down, 8.7>> 8.2 today, no overt bleeding (normal-appearing BM yesterday).  INR 8 initially, now improved after dose of vit k. INR 1.7  - Iron low at 8. Ferritin 27. Will give feraheme. Pharmacy to dose.  6. PAD: Patient with aorto-bifemoral bypass, h/o iliac stenting, h/o femoral endarterectomy, PCI to right SFA in 2017.  - He would not be able to get femoral IABP.  If needed, axillary would be our best option.  7. H/o renal artery stenosis.  8. AKI: Concern for cardiorenal syndrome.  SCr increasing, 1.04>>1.4.  Follow closely.   9. Elevated LFTs: Shock liver versus congestive hepatopathy.  LFTs have been trending down. Do not think that amiodarone is the cause (elevated LFTs prior to amiodarone).  - Continue IV Lasix.  10. DM2: cover with sliding scale.    Length of Stay: [address], [place]  10/09/2019, 7:56 AM  Advanced Heart Failure Team Pager [phone]  (M-F; 7a - 4p)  Please contact [facility] for night-coverage after hours (4p -7a ) and weekends on [email or web address]  Patient seen with PA, agree with the above note.    Overnight, patient went into atrial fibrillation with rate controlled in 70s. Co-ox 58% this morning with CVP still high at 13.  Creatinine up to 1.4 today.  Hgb lower at 8.2 but no overt bleeding (normal BM last night).  Transferrin saturation 2%.   General: NAD Neck: JVP 12-14 cm, no thyromegaly or thyroid nodule.  Lungs: Decreased BS at bases.  CV: Nondisplaced PMI.  Heart irregular S1/S2 with mechanical S2, no S3/S4, 2/6 SEM RUSB.  1+ edema to knees bilaterally. Abdomen: Soft, nontender, no hepatosplenomegaly, no distention.  Skin: Intact without lesions or rashes.  Neurologic: Alert and oriented x 3.  Psych: Normal affect. Extremities: No clubbing or cyanosis.  HEENT: Normal.   Patient remains volume overloaded on exam with CVP 13 but creatinine up to 1.4 today.   - I will increase Lasix to 80 mg IV bid but will stop losartan.   Co-ox 58%. Will not start inotrope but will add digoxin 0.125 daily.   LFTs trending down.   INR 1.7 today, warfarin on hold. He is on heparin gtt for mechanical valve and NSTEMI.  No overt bleeding, normal BM yesterday but hemoglobin low at 8.2 and low transferrin sat.  - Will give feraheme today.  - Transfuse Hgb < 8.   No chest pain.  Plan for PCI to left main (LAD protected by LIMA) tomorrow morning.  Anticipate Plavix + warfarin as his long-term anticoagulation/antiplatelet plan. Will need to follow hemoglobin closely, however.   Atrial fibrillation is new.  Continue heparin gtt, if he does not come out of atrial fibrillation on his own may need DCCV eventually.   CRITICAL CARE Performed by: Loralie Champagne  Total critical care time: 35 minutes  Critical care time was exclusive of separately billable procedures and treating other patients.  Critical care was necessary to treat or  prevent imminent or life-threatening deterioration.  Critical care was  time spent personally by me on the following activities: development of treatment plan with patient and/or surrogate as well as nursing, discussions with consultants, evaluation of patient's response to treatment, examination of patient, obtaining history from patient or surrogate, ordering and performing treatments and interventions, ordering and review of laboratory studies, ordering and review of radiographic studies, pulse oximetry and re-evaluation of patient's condition.  Loralie Champagne 10/09/2019 9:17 AM

## 2019-10-09 NOTE — Progress Notes (Signed)
Orthopedic Tech Progress Note Patient Details:  [name] [name] 06-04-1946 [id number]  Ortho Devices Type of Ortho Device: Haematologist Ortho Device/Splint Location: Bilateral Ortho Device/Splint Interventions: Application, Ordered, Adjustment   Post Interventions Patient Tolerated: Well Instructions Provided: Care of device, Adjustment of device   [name] 10/09/2019, 11:11 AM

## 2019-10-10 ENCOUNTER — Encounter (HOSPITAL_COMMUNITY)
Admission: EM | Disposition: A | Discharge status: Home / Self Care | Payer: Self-pay | Source: Home / Self Care | Attending: Cardiology

## 2019-10-10 DIAGNOSIS — I25110 Atherosclerotic heart disease of native coronary artery with unstable angina pectoris: Secondary | ICD-10-CM

## 2019-10-10 HISTORY — PX: CORONARY ATHERECTOMY: CATH118238

## 2019-10-10 HISTORY — PX: TEMPORARY PACEMAKER: CATH118268

## 2019-10-10 HISTORY — PX: CORONARY STENT INTERVENTION: CATH118234

## 2019-10-10 LAB — POCT I-STAT 7, (LYTES, BLD GAS, ICA,H+H)
Bicarbonate: 24.5 mmol/L (ref 20.0–28.0)
Calcium, Ion: 1.19 mmol/L (ref 1.15–1.40)
HCT: 30 % — ABNORMAL LOW (ref 39.0–52.0)
Hemoglobin: 10.2 g/dL — ABNORMAL LOW (ref 13.0–17.0)
O2 Saturation: 90 %
Potassium: 3.8 mmol/L (ref 3.5–5.1)
Sodium: 134 mmol/L — ABNORMAL LOW (ref 135–145)
TCO2: 26 mmol/L (ref 22–32)
pCO2 arterial: 38.5 mmHg (ref 32.0–48.0)
pH, Arterial: 7.412 (ref 7.350–7.450)
pO2, Arterial: 58 mmHg — ABNORMAL LOW (ref 83.0–108.0)

## 2019-10-10 LAB — COOXEMETRY PANEL
Carboxyhemoglobin: 1.2 % (ref 0.5–1.5)
Carboxyhemoglobin: 1.5 % (ref 0.5–1.5)
Methemoglobin: 1.2 % (ref 0.0–1.5)
Methemoglobin: 1.5 % (ref 0.0–1.5)
O2 Saturation: 49.4 %
O2 Saturation: 61.5 %
Total hemoglobin: 8.5 g/dL — ABNORMAL LOW (ref 12.0–16.0)
Total hemoglobin: 8.8 g/dL — ABNORMAL LOW (ref 12.0–16.0)

## 2019-10-10 LAB — CBC
HCT: 28.2 % — ABNORMAL LOW (ref 39.0–52.0)
Hemoglobin: 8.5 g/dL — ABNORMAL LOW (ref 13.0–17.0)
MCH: 23.5 pg — ABNORMAL LOW (ref 26.0–34.0)
MCHC: 30.1 g/dL (ref 30.0–36.0)
MCV: 78.1 fL — ABNORMAL LOW (ref 80.0–100.0)
Platelets: 292 10*3/uL (ref 150–400)
RBC: 3.61 MIL/uL — ABNORMAL LOW (ref 4.22–5.81)
RDW: 24.6 % — ABNORMAL HIGH (ref 11.5–15.5)
WBC: 7.7 10*3/uL (ref 4.0–10.5)
nRBC: 27.8 % — ABNORMAL HIGH (ref 0.0–0.2)

## 2019-10-10 LAB — HEPATIC FUNCTION PANEL
ALT: 471 U/L — ABNORMAL HIGH (ref 0–44)
AST: 119 U/L — ABNORMAL HIGH (ref 15–41)
Albumin: 3 g/dL — ABNORMAL LOW (ref 3.5–5.0)
Alkaline Phosphatase: 73 U/L (ref 38–126)
Bilirubin, Direct: 0.2 mg/dL (ref 0.0–0.2)
Indirect Bilirubin: 0.9 mg/dL (ref 0.3–0.9)
Total Bilirubin: 1.1 mg/dL (ref 0.3–1.2)
Total Protein: 5.7 g/dL — ABNORMAL LOW (ref 6.5–8.1)

## 2019-10-10 LAB — GLUCOSE, CAPILLARY
Glucose-Capillary: 128 mg/dL — ABNORMAL HIGH (ref 70–99)
Glucose-Capillary: 174 mg/dL — ABNORMAL HIGH (ref 70–99)
Glucose-Capillary: 129 mg/dL — ABNORMAL HIGH (ref 70–99)
Glucose-Capillary: 191 mg/dL — ABNORMAL HIGH (ref 70–99)
Glucose-Capillary: 195 mg/dL — ABNORMAL HIGH (ref 70–99)

## 2019-10-10 LAB — BASIC METABOLIC PANEL
Anion gap: 12 (ref 5–15)
BUN: 28 mg/dL — ABNORMAL HIGH (ref 8–23)
CO2: 25 mmol/L (ref 22–32)
Calcium: 8.5 mg/dL — ABNORMAL LOW (ref 8.9–10.3)
Chloride: 96 mmol/L — ABNORMAL LOW (ref 98–111)
Creatinine, Ser: 1.11 mg/dL (ref 0.61–1.24)
GFR calc Af Amer: >60 mL/min (ref >60)
GFR calc non Af Amer: >60 mL/min (ref >60)
Glucose, Bld: 180 mg/dL — ABNORMAL HIGH (ref 70–99)
Potassium: 3.5 mmol/L (ref 3.5–5.1)
Sodium: 133 mmol/L — ABNORMAL LOW (ref 135–145)

## 2019-10-10 LAB — POCT ACTIVATED CLOTTING TIME
Activated Clotting Time: 224 seconds
Activated Clotting Time: 241 seconds
Activated Clotting Time: 263 seconds
Activated Clotting Time: 235 seconds
Activated Clotting Time: 252 seconds
Activated Clotting Time: 197 seconds
Activated Clotting Time: 186 seconds

## 2019-10-10 LAB — HEPARIN LEVEL (UNFRACTIONATED): Heparin Unfractionated: 0.32 IU/mL (ref 0.30–0.70)

## 2019-10-10 LAB — PROTIME-INR
INR: 1.6 — ABNORMAL HIGH (ref 0.8–1.2)
Prothrombin Time: 18.4 seconds — ABNORMAL HIGH (ref 11.4–15.2)

## 2019-10-10 LAB — MAGNESIUM: Magnesium: 1.8 mg/dL (ref 1.7–2.4)

## 2019-10-10 LAB — PATHOLOGIST SMEAR REVIEW: Path Review: ABNORMAL

## 2019-10-10 SURGERY — CORONARY ATHERECTOMY
Anesthesia: LOCAL

## 2019-10-10 MED ORDER — HEPARIN (PORCINE) IN NACL 1000-0.9 UT/500ML-% IV SOLN
INTRAVENOUS | Status: DC | PRN
  Administered 2019-10-10: 500 mL

## 2019-10-10 MED ORDER — HEPARIN (PORCINE) IN NACL 1000-0.9 UT/500ML-% IV SOLN
INTRAVENOUS | Status: AC
  Filled 2019-10-10: qty 500

## 2019-10-10 MED ORDER — POTASSIUM CHLORIDE CRYS ER 20 MEQ PO TBCR
40.0000 meq | EXTENDED_RELEASE_TABLET | Freq: Once | ORAL | Status: AC
  Administered 2019-10-10: 40 meq via ORAL
  Filled 2019-10-10: qty 2

## 2019-10-10 MED ORDER — WARFARIN - PHARMACIST DOSING INPATIENT
Freq: Every day | Status: DC
  Administered 2019-10-11 – 2019-10-16 (×3)

## 2019-10-10 MED ORDER — VERAPAMIL HCL 2.5 MG/ML IV SOLN
INTRAVENOUS | Status: AC
  Filled 2019-10-10: qty 2

## 2019-10-10 MED ORDER — NITROGLYCERIN 1 MG/10 ML FOR IR/CATH LAB
INTRA_ARTERIAL | Status: AC
  Filled 2019-10-10: qty 10

## 2019-10-10 MED ORDER — HEPARIN (PORCINE) 25000 UT/250ML-% IV SOLN
1300.0000 [IU]/h | INTRAVENOUS | Status: DC
  Administered 2019-10-11 (×2): 1450 [IU]/h via INTRAVENOUS
  Administered 2019-10-12: 1300 [IU]/h via INTRAVENOUS
  Filled 2019-10-10 (×3): qty 250

## 2019-10-10 MED ORDER — IOHEXOL 350 MG/ML SOLN
INTRAVENOUS | Status: DC | PRN
  Administered 2019-10-10: 85 mL via INTRA_ARTERIAL

## 2019-10-10 MED ORDER — MIDAZOLAM HCL 2 MG/2ML IJ SOLN
INTRAMUSCULAR | Status: DC | PRN
  Administered 2019-10-10: 1 mg via INTRAVENOUS
  Administered 2019-10-10: 2 mg via INTRAVENOUS

## 2019-10-10 MED ORDER — FENTANYL CITRATE (PF) 100 MCG/2ML IJ SOLN
INTRAMUSCULAR | Status: DC | PRN
  Administered 2019-10-10 (×2): 25 ug via INTRAVENOUS

## 2019-10-10 MED ORDER — CLOPIDOGREL BISULFATE 75 MG PO TABS
75.0000 mg | ORAL_TABLET | Freq: Every day | ORAL | Status: DC
  Administered 2019-10-11 – 2019-10-17 (×7): 75 mg via ORAL
  Filled 2019-10-10 (×7): qty 1

## 2019-10-10 MED ORDER — HEPARIN SODIUM (PORCINE) 1000 UNIT/ML IJ SOLN
INTRAMUSCULAR | Status: AC
  Filled 2019-10-10: qty 1

## 2019-10-10 MED ORDER — MIDAZOLAM HCL 2 MG/2ML IJ SOLN
INTRAMUSCULAR | Status: AC
  Filled 2019-10-10: qty 2

## 2019-10-10 MED ORDER — SODIUM CHLORIDE 0.9 % IV SOLN
250.0000 mL | INTRAVENOUS | Status: DC | PRN
  Administered 2019-10-11: 250 mL via INTRAVENOUS

## 2019-10-10 MED ORDER — HEPARIN SODIUM (PORCINE) 1000 UNIT/ML IJ SOLN
INTRAMUSCULAR | Status: DC | PRN
  Administered 2019-10-10: 2000 [IU] via INTRAVENOUS
  Administered 2019-10-10: 6000 [IU] via INTRAVENOUS
  Administered 2019-10-10: 2000 [IU] via INTRAVENOUS
  Administered 2019-10-10: 4000 [IU] via INTRAVENOUS

## 2019-10-10 MED ORDER — WARFARIN SODIUM 5 MG PO TABS
10.0000 mg | ORAL_TABLET | Freq: Once | ORAL | Status: AC
  Administered 2019-10-10: 10 mg via ORAL
  Filled 2019-10-10: qty 1
  Filled 2019-10-10: qty 2

## 2019-10-10 MED ORDER — POTASSIUM CHLORIDE CRYS ER 20 MEQ PO TBCR: 40.0000 meq | EXTENDED_RELEASE_TABLET | Freq: Once | ORAL | Status: DC

## 2019-10-10 MED ORDER — FENTANYL CITRATE (PF) 100 MCG/2ML IJ SOLN
INTRAMUSCULAR | Status: AC
  Filled 2019-10-10: qty 2

## 2019-10-10 MED ORDER — SODIUM CHLORIDE 0.9 % WEIGHT BASED INFUSION
1.0000 mL/kg/h | INTRAVENOUS | Status: AC
  Administered 2019-10-10: 1 mL/kg/h via INTRAVENOUS

## 2019-10-10 MED ORDER — SODIUM CHLORIDE 0.9 % IV SOLN
INTRAVENOUS | Status: AC | PRN
  Administered 2019-10-10: 10 mL/h via INTRAVENOUS

## 2019-10-10 MED ORDER — SODIUM CHLORIDE 0.9% FLUSH
3.0000 mL | Freq: Two times a day (BID) | INTRAVENOUS | Status: DC
  Administered 2019-10-12 – 2019-10-14 (×5): 3 mL via INTRAVENOUS

## 2019-10-10 MED ORDER — METOLAZONE 2.5 MG PO TABS
2.5000 mg | ORAL_TABLET | Freq: Once | ORAL | Status: AC
  Administered 2019-10-10: 2.5 mg via ORAL
  Filled 2019-10-10: qty 1

## 2019-10-10 MED ORDER — CLOPIDOGREL BISULFATE 300 MG PO TABS
600.0000 mg | ORAL_TABLET | Freq: Once | ORAL | Status: AC
  Administered 2019-10-10: 600 mg via ORAL
  Filled 2019-10-10: qty 2

## 2019-10-10 MED ORDER — NITROGLYCERIN 1 MG/10 ML FOR IR/CATH LAB
INTRA_ARTERIAL | Status: DC | PRN
  Administered 2019-10-10: 200 ug via INTRACORONARY

## 2019-10-10 MED ORDER — HYDRALAZINE HCL 20 MG/ML IJ SOLN: 10.0000 mg | INTRAMUSCULAR | Status: AC | PRN

## 2019-10-10 MED ORDER — LIDOCAINE HCL (PF) 1 % IJ SOLN
INTRAMUSCULAR | Status: AC
  Filled 2019-10-10: qty 30

## 2019-10-10 MED ORDER — SODIUM CHLORIDE 0.9% FLUSH: 3.0000 mL | INTRAVENOUS | Status: DC | PRN

## 2019-10-10 MED ORDER — LABETALOL HCL 5 MG/ML IV SOLN: 10.0000 mg | INTRAVENOUS | Status: AC | PRN

## 2019-10-10 MED ORDER — LIDOCAINE HCL (PF) 1 % IJ SOLN
INTRAMUSCULAR | Status: DC | PRN
  Administered 2019-10-10: 15 mL

## 2019-10-10 MED ORDER — VIPERSLIDE LUBRICANT OPTIME
TOPICAL | Status: DC | PRN
  Administered 2019-10-10: 17:00:00 via SURGICAL_CAVITY

## 2019-10-10 MED ORDER — MAGNESIUM SULFATE 2 GM/50ML IV SOLN
2.0000 g | Freq: Once | INTRAVENOUS | Status: AC
  Administered 2019-10-10: 2 g via INTRAVENOUS
  Filled 2019-10-10: qty 50

## 2019-10-10 SURGICAL SUPPLY — 26 items
BALLN  ~~LOC~~ SAPPHIRE 4.5X12 (BALLOONS) ×1
BALLN EMERGE MR 3.0X12 (BALLOONS) ×4
BALLN ~~LOC~~ SAPPHIRE 4.5X12 (BALLOONS) ×1
BALLOON EMERGE MR 3.0X12 (BALLOONS) IMPLANT
BALLOON ~~LOC~~ SAPPHIRE 4.5X12 (BALLOONS) IMPLANT
CABLE ADAPT PACING TEMP 12FT (ADAPTER) ×1 IMPLANT
CATH LAUNCHER 6FR EBU3.5 (CATHETERS) ×1 IMPLANT
CATH S G BIP PACING (CATHETERS) ×1 IMPLANT
CROWN DIAMONDBACK CLASSIC 1.25 (BURR) ×1 IMPLANT
ELECT DEFIB PAD ADLT CADENCE (PAD) ×1 IMPLANT
GLIDESHEATH SLEND SS 6F .021 (SHEATH) ×1 IMPLANT
GUIDEWIRE INQWIRE 1.5J.035X260 (WIRE) IMPLANT
INQWIRE 1.5J .035X260CM (WIRE) ×2
KIT ENCORE 26 ADVANTAGE (KITS) ×1 IMPLANT
KIT HEART LEFT (KITS) ×2 IMPLANT
LUBRICANT VIPERSLIDE CORONARY (MISCELLANEOUS) ×1 IMPLANT
PACK CARDIAC CATHETERIZATION (CUSTOM PROCEDURE TRAY) ×2 IMPLANT
SHEATH PINNACLE 6F 10CM (SHEATH) ×2 IMPLANT
SHEATH PROBE COVER 6X72 (BAG) ×1 IMPLANT
STENT RESOLUTE ONYX 4.0X15 (Permanent Stent) ×1 IMPLANT
TRANSDUCER W/STOPCOCK (MISCELLANEOUS) ×2 IMPLANT
TUBING CIL FLEX 10 FLL-RA (TUBING) ×2 IMPLANT
WIRE COUGAR XT STRL 190CM (WIRE) ×1 IMPLANT
WIRE EMERALD 3MM-J .025X260CM (WIRE) ×1 IMPLANT
WIRE EMERALD 3MM-J .035X150CM (WIRE) ×1 IMPLANT
WIRE VIPERWIRE COR FLEX .012 (WIRE) ×1 IMPLANT

## 2019-10-10 NOTE — Interval H&P Note (Signed)
Cath Lab Visit (complete for each Cath Lab visit)  Clinical Evaluation Leading to the Procedure:   ACS: No.  Non-ACS:    Anginal Classification: CCS III  Anti-ischemic medical therapy: Minimal Therapy (1 class of medications)  Non-Invasive Test Results: No non-invasive testing performed  Prior CABG: Previous CABG      History and Physical Interval Note:  10/10/2019 2:38 PM  [name] [name]  has presented today for surgery, with the diagnosis of CAD.  The various methods of treatment have been discussed with the patient and family. After consideration of risks, benefits and other options for treatment, the patient has consented to  Procedure(s): CORONARY ATHERECTOMY (N/A) as a surgical intervention.  The patient's history has been reviewed, patient examined, no change in status, stable for surgery.  I have reviewed the patient's chart and labs.  Questions were answered to the patient's satisfaction.     Sherren Mocha

## 2019-10-10 NOTE — Progress Notes (Addendum)
Patient ID: [name] [name], male   DOB: Nov 13, 1946, 73 y.o.   MRN: [id number]     Advanced Heart Failure Rounding Note  PCP-Cardiologist: Jenkins Rouge, MD   Subjective:    Lasix increased yesterday and pt notes good urinary response, although only 1L total documented in UOP yesterday. Net negative 4L total this admit. Remains volume overloaded. CVP 15 today.  Co-ox pending. Scr improved overnight from 1.42>>1.11.   Awaiting LHC w/ planned LM intervention today (LAD protected by LIMA). Stable w/o CP.  Hs-TnI 398 => 644 => 935  BP stable.  Initially presented w/ supratherapeutic INR on admit at 8.0. He got vitamin K for reversal. INR 1.6 today. No overt GI bleeding. Hgb stable past 72 hrs at 8.5. Low transferrin saturation at 2%. Started on feraheme 11/2.   Echo: EF 30-35%,  Anterior and anteroseptal hypokinesis, basal-mid inferolateral and anterolateral hypokinesis, normal RV, mechanical aortic valve looks ok, dilated IVC.    Objective:   Weight Range: 81.5 kg Body mass index is 27.32 kg/m.   Vital Signs:   Temp:  [98.1 F (36.7 C)-98.7 F (37.1 C)] 98.7 F (37.1 C) (11/03 0400) Pulse Rate:  [40-88] 40 (11/03 0600) Resp:  [10-22] 18 (11/03 0600) BP: (98-168)/(36-84) 135/53 (11/03 0600) SpO2:  [90 %-95 %] 93 % (11/03 0600) Weight:  [81.5 kg] 81.5 kg (11/03 0500) Last BM Date: 10/09/19  Weight change: Filed Weights   10/08/19 0600 10/09/19 0657 10/10/19 0500  Weight: 79.2 kg 80.2 kg 81.5 kg    Intake/Output:   Intake/Output Summary (Last 24 hours) at 10/10/2019 0706 Last data filed at 10/10/2019 0600 Gross per 24 hour  Intake 933.5 ml  Output 825 ml  Net 108.5 ml      Physical Exam    PHYSICAL EXAM: General:  Well appearing elderly WM. No respiratory difficulty HEENT: normal Neck: supple. Elevated JVD. Carotids 2+ bilat; no bruits. No lymphadenopathy or thyromegaly appreciated. Cor: PMI nondisplaced. irregularly irregular rhythm, regular rate. No rubs,  gallops or murmurs. Lungs: clear Abdomen: soft, nontender, nondistended. No hepatosplenomegaly. No bruits or masses. Good bowel sounds. Extremities: no cyanosis, clubbing, rash, bilateral pedal edema, bilateral unna boots present Neuro: alert & oriented x 3, cranial nerves grossly intact. moves all 4 extremities w/o difficulty. Affect pleasant.    Telemetry   Afib w/ CVR in the 70s, frequent PVCs, 4 beats of NSVT  EKG    N/a   Labs    CBC Recent Labs    10/09/19 0450 10/10/19 0528  WBC 7.4 PENDING  HGB 8.2* 8.5*  HCT 27.4* 28.2*  MCV 78.1* 78.1*  PLT 282 798   Basic Metabolic Panel Recent Labs    10/09/19 0450 10/10/19 0528  NA 133* 133*  K 4.0 3.5  CL 97* 96*  CO2 26 25  GLUCOSE 216* 180*  BUN 34* 28*  CREATININE 1.42* 1.11  CALCIUM 8.2* 8.5*  MG 2.1 1.8   Liver Function Tests Recent Labs    10/09/19 0450 10/10/19 0528  AST 214* 119*  ALT 619* 471*  ALKPHOS 72 73  BILITOT 0.8 1.1  PROT 5.4* 5.7*  ALBUMIN 2.8* 3.0*   No results for input(s): LIPASE, AMYLASE in the last 72 hours. Cardiac Enzymes No results for input(s): CKTOTAL, CKMB, CKMBINDEX, TROPONINI in the last 72 hours.  BNP: BNP (last 3 results) Recent Labs    09/16/19 1008 10/06/19 0958  BNP 676.4* 899.1*    ProBNP (last 3 results) Recent Labs    10/02/19 1131  PROBNP 3,116*     D-Dimer No results for input(s): DDIMER in the last 72 hours. Hemoglobin A1C Recent Labs    10/08/19 0853  HGBA1C 8.2*   Fasting Lipid Panel No results for input(s): CHOL, HDL, LDLCALC, TRIG, CHOLHDL, LDLDIRECT in the last 72 hours. Thyroid Function Tests No results for input(s): TSH, T4TOTAL, T3FREE, THYROIDAB in the last 72 hours.  Invalid input(s): FREET3  Other results:   Imaging    No results found.   Medications:     Scheduled Medications: . aspirin EC  81 mg Oral Daily  . atorvastatin  80 mg Oral Daily  . carvedilol  3.125 mg Oral BID WC  . Chlorhexidine Gluconate Cloth   6 each Topical Daily  . digoxin  0.125 mg Oral Daily  . furosemide  80 mg Intravenous BID  . insulin aspart  0-9 Units Subcutaneous Q4H  . insulin glargine  12 Units Subcutaneous Daily  . isosorbide mononitrate  60 mg Oral Daily  . pantoprazole  40 mg Oral Daily  . potassium chloride  40 mEq Oral Once  . sodium chloride flush  10-40 mL Intracatheter Q12H  . sodium chloride flush  3 mL Intravenous Once  . sodium chloride flush  3 mL Intravenous Q12H  . spironolactone  25 mg Oral Daily    Infusions: . sodium chloride    . sodium chloride 10 mL/hr at 10/10/19 0600  . amiodarone 30 mg/hr (10/10/19 0600)  . ferumoxytol 510 mg (10/09/19 0919)  . heparin 1,450 Units/hr (10/10/19 0600)    PRN Medications: sodium chloride, acetaminophen, nitroGLYCERIN, ondansetron (ZOFRAN) IV, sodium chloride flush, sodium chloride flush    Assessment/Plan   1. Acute systolic CHF: Ischemic cardiomyopathy.  Echo this admission with EF 30-35% and wall motion abnormalities, prior echo earlier this month showed EF 50-55%.  Cath earlier this month showed jeopardized ramus and LCx territory (99% distal left main, occluded LAD, patent LIMA-LAD).  Concerned that ischemia in this territory has triggered CHF.  Echo showed lateral wall hypokinesis.  With elevated LFTs and creatinine at admission, there was initial concern for low output/cardiogenic shock physiology but initial co-ox was 67%. No IV inotrope requirements currently. Digoxin added 11/2.  - Lasix increased to 80 mg bid yesterday. Only 1L total documented in UOP yesterday. Net negative 4L total this admit. Remains volume overloaded. CVP 15 today. SCr improved to 1.1 today. May benefit from dose of metolazone to help w/ diuresis, however scheduled for cath today w/ contrast. Will defer to MD.  - Continue spironolactone to 25 mg daily.  - Losartan on hold currently due to soft BP and planned LHC. May resume in next 24-48 hrs if BP and SCr stable.  - Continue  Coreg 3.125 mg bid.  - Continue Imdur 60 mg  - Continue digoxin 0.125 mg 2. CAD: Complicated disease, coronary angiography earlier this month showed patent SVG-RCA territory and patent LIMA-LAD.  The proximal LAD was occluded and there was 99% distal left main stenosis.  This left the ramus and LCx in jeopardy.  He was not a good candidate for protected PCI => cannot place Impella with mechanical aortic valve and peripheral vascular disease likely precludes a femoral IABP.  It was decided to try to manage him medically.  However, he returns with progressive chest pain episodes and NSTEMI, hs-TnI up to 935.  Now chest pain-free at rest.    - INR 1.6, currently on heparin gtt with warfarin on hold.     - Continue  ASA 81 and atorvastatin 80 daily.  - Continue Imdur 60 daily.  - At this point, he is failing medical management and will need intervention on distal left main. Discussed with Dr. Burt Knack, plan PCI today with atherectomy of left main stenosis, LIMA protects the LAD.  Post-PCI antiplatelet/anticoagulation regimen will need to be warfarin + Plavix.  3. Mechanical aortic valve: Stable on echo this admission.  - Continue ASA 81.  - Warfarin on hold for anticipated cath w/ intervention, INR 1.6. Continue heparin gtt.  4. NSVT:amiodarone gtt started.  Improved but still w/ frequent PVCs. Likely due to ischemia. Had sinus pauses x 2 during sleep 11/2, up to 3.2 sec. No recurrence - Continue amiodarone gtt + coreg but monitor closely.  - Replete K and Mg as needed to keep K >4.0 and Mg >2.0.  5. H/o GI bleeding: no overt bleeding (normal-appearing BM since admit).  INR 8 initially, now improved after dose of vit k. INR 1.6. Hgb stable past 72 hrs at 8.5 - Iron low at 8. Ferritin 27. Feraheme started.  6. PAD: Patient with aorto-bifemoral bypass, h/o iliac stenting, h/o femoral endarterectomy, PCI to right SFA in 2017.  - He would not be able to get femoral IABP.  If needed, axillary would be our best  option.  7. H/o renal artery stenosis.  8. AKI: SCr improving w/ diuresis, 1.1 today.  Follow closely.   9. Elevated LFTs: Shock liver versus congestive hepatopathy.  LFTs have been trending down. Do not think that amiodarone is the cause (elevated LFTs prior to amiodarone).  - Continue IV Lasix.  10. DM2: cover with sliding scale.   11. Atrial fibrillation: Atrial fibrillation is new.  Continue heparin gtt, if he does not come out of atrial fibrillation on his own may need DCCV eventually.   Length of Stay: [phone], PA-C  10/10/2019, 7:06 AM  Advanced Heart Failure Team Pager [phone] (M-F; 7a - 4p)  Please contact [facility] for night-coverage after hours (4p -7a ) and weekends on [email or web address]  Patient seen with NP, agree with the above note.   He was able to lie down to sleep last night but short of breath walking to the bathroom today.  No chest pain.  CVP is still quite high at 15-16 this morning and he did not diurese well.  However, creatinine better at 1.11.  Co-ox lower today at 49%.    Hgb higher today, no overt GI bleeding.  INR 1.6 today on heparin gtt, FOBT negative.   He remains in atrial fibrillation today on amiodarone gtt, minimal ventricular ectopy.   General: NAD Neck: JVP 14+ cm, no thyromegaly or thyroid nodule.  Lungs: Clear to auscultation bilaterally with normal respiratory effort. CV: Nondisplaced PMI.  Heart irregular S1/S2 with mechanical S2, no S3/S4, 2/6 SEM RUSB.  1+ ankle edema, legs wrapped.   Abdomen: Soft, nontender, no hepatosplenomegaly, no distention.  Skin: Intact without lesions or rashes.  Neurologic: Alert and oriented x 3.  Psych: Normal affect. Extremities: No clubbing or cyanosis.  HEENT: Normal.   Co-ox lower today with CVP still high at 15-16, UOP not vigorous.  Creatinine improved at 1.11 and LFTs trending down.  - Re-send co-ox this morning.  - Continue digoxin, will try to hold off on milrinone for now with atrial  fibrillation (and prior co-ox readings ok).  - Lasix 80 mg IV bid, will give dose of metolazone 2.5 today and replace K, would like to see CVP  lower when he goes into cath lab.  - Hold losartan with prior rise in creatinine, continue spironolactone.   Plan for PCI to LM today (for LCx system, protected LAD with LIMA).  Will need atherectomy.  He is currently on heparin gtt, eventual regimen will need to be warfarin + Plavix.  I will give him 600 mg Plavix this morning.   Hgb 8.5 (higher today). Has had IV Fe.  FOBT negative.  Has history of GI bleeding but no overt bleeding currently.   He remains in atrial fibrillation.  This is a new diagnosis, has been on amiodarone due to NSVT.  Will continue amiodarone for now, may need DCCV if he does not convert on his own. Rate is controlled.   CRITICAL CARE Performed by: Loralie Champagne  Total critical care time: 35 minutes  Critical care time was exclusive of separately billable procedures and treating other patients.  Critical care was necessary to treat or prevent imminent or life-threatening deterioration.  Critical care was time spent personally by me on the following activities: development of treatment plan with patient and/or surrogate as well as nursing, discussions with consultants, evaluation of patient's response to treatment, examination of patient, obtaining history from patient or surrogate, ordering and performing treatments and interventions, ordering and review of laboratory studies, ordering and review of radiographic studies, pulse oximetry and re-evaluation of patient's condition.  Loralie Champagne 10/10/2019 8:04 AM

## 2019-10-10 NOTE — Progress Notes (Signed)
ANTICOAGULATION CONSULT NOTE  Pharmacy Consult for heparin Indication: chest pain/ACS  No Known Allergies  Patient Measurements: Height: 5\' 8"  (172.7 cm) Weight: 176 lb 2.4 oz (79.9 kg) IBW/kg (Calculated) : 68.4 Heparin dosing weight: 79 kg  Vital Signs: Temp: 98.7 F (37.1 C) (11/03 0400) Temp Source: Oral (11/03 0400) BP: 125/45 (11/03 0930) Pulse Rate: 74 (11/03 0930)  Labs: Recent Labs    10/08/19 0244  10/09/19 0450 10/09/19 0505 10/09/19 1630 10/10/19 0528  HGB 8.7*  --  8.2*  --   --  8.5*  HCT 29.0*  --  27.4*  --   --  28.2*  PLT 308  --  282  --   --  292  LABPROT 17.9*  --  19.3*  --   --  18.4*  INR 1.5*  --  1.7*  --   --  1.6*  HEPARINUNFRC  --    < >  --  0.30 0.51 0.32  CREATININE 1.02  --  1.42*  --   --  1.11   < > = values in this interval not displayed.    Estimated Creatinine Clearance: 56.5 mL/min (by C-G formula based on SCr of 1.11 mg/dL).   Assessment: Patient is a 73 year old male who presented with shortness of breath and chest pain. Recent cath showed occluded proxLAD and 99% distal left main stenosis. Planning PCI today with atherectomy of left main stenosis this afternoon.  Patient is on warfarin PTA for mechanical AVR (INR goal 2.5-3.5). Dose: 5 mg daily except 10 mg on Su, Tu, Th  Heparin level therapeutic at 0.32 on 1450 units/hr. Hgb low but improving at 8.5, plts remain wnl. No overt bleeding or infusion issues noted.   Goal of Therapy:  Heparin level 0.3-0.7 units/ml Monitor platelets by anticoagulation protocol: Yes   Plan:  Continue heparin gtt to 1450 units/hr Daily HL, CBC Monitor for bleeding F/u anticoagulation plans post-cath  Richardine Service, PharmD PGY1 Pharmacy Resident Phone: [phone] 10/10/2019  10:54 AM  Please check [email or web address] for unit-specific pharmacy phone numbers.

## 2019-10-10 NOTE — H&P (View-Only) (Signed)
Patient ID: [name] [name], male   DOB: [birth year], 73 y.o.   MRN: [id number]     Advanced Heart Failure Rounding Note  PCP-Cardiologist: Jenkins Rouge, MD   Subjective:    Lasix increased yesterday and pt notes good urinary response, although only 1L total documented in UOP yesterday. Net negative 4L total this admit. Remains volume overloaded. CVP 15 today.  Co-ox pending. Scr improved overnight from 1.42>>1.11.   Awaiting LHC w/ planned LM intervention today (LAD protected by LIMA). Stable w/o CP.  Hs-TnI 398 => 644 => 935  BP stable.  Initially presented w/ supratherapeutic INR on admit at 8.0. He got vitamin K for reversal. INR 1.6 today. No overt GI bleeding. Hgb stable past 72 hrs at 8.5. Low transferrin saturation at 2%. Started on feraheme 11/2.   Echo: EF 30-35%,  Anterior and anteroseptal hypokinesis, basal-mid inferolateral and anterolateral hypokinesis, normal RV, mechanical aortic valve looks ok, dilated IVC.    Objective:   Weight Range: 81.5 kg Body mass index is 27.32 kg/m.   Vital Signs:   Temp:  [98.1 F (36.7 C)-98.7 F (37.1 C)] 98.7 F (37.1 C) (11/03 0400) Pulse Rate:  [40-88] 40 (11/03 0600) Resp:  [10-22] 18 (11/03 0600) BP: (98-168)/(36-84) 135/53 (11/03 0600) SpO2:  [90 %-95 %] 93 % (11/03 0600) Weight:  [81.5 kg] 81.5 kg (11/03 0500) Last BM Date: 10/09/19  Weight change: Filed Weights   10/08/19 0600 10/09/19 0657 10/10/19 0500  Weight: 79.2 kg 80.2 kg 81.5 kg    Intake/Output:   Intake/Output Summary (Last 24 hours) at 10/10/2019 0706 Last data filed at 10/10/2019 0600 Gross per 24 hour  Intake 933.5 ml  Output 825 ml  Net 108.5 ml      Physical Exam    PHYSICAL EXAM: General:  Well appearing elderly WM. No respiratory difficulty HEENT: normal Neck: supple. Elevated JVD. Carotids 2+ bilat; no bruits. No lymphadenopathy or thyromegaly appreciated. Cor: PMI nondisplaced. irregularly irregular rhythm, regular rate. No rubs,  gallops or murmurs. Lungs: clear Abdomen: soft, nontender, nondistended. No hepatosplenomegaly. No bruits or masses. Good bowel sounds. Extremities: no cyanosis, clubbing, rash, bilateral pedal edema, bilateral unna boots present Neuro: alert & oriented x 3, cranial nerves grossly intact. moves all 4 extremities w/o difficulty. Affect pleasant.    Telemetry   Afib w/ CVR in the 70s, frequent PVCs, 4 beats of NSVT  EKG    N/a   Labs    CBC Recent Labs    10/09/19 0450 10/10/19 0528  WBC 7.4 PENDING  HGB 8.2* 8.5*  HCT 27.4* 28.2*  MCV 78.1* 78.1*  PLT 282 017   Basic Metabolic Panel Recent Labs    10/09/19 0450 10/10/19 0528  NA 133* 133*  K 4.0 3.5  CL 97* 96*  CO2 26 25  GLUCOSE 216* 180*  BUN 34* 28*  CREATININE 1.42* 1.11  CALCIUM 8.2* 8.5*  MG 2.1 1.8   Liver Function Tests Recent Labs    10/09/19 0450 10/10/19 0528  AST 214* 119*  ALT 619* 471*  ALKPHOS 72 73  BILITOT 0.8 1.1  PROT 5.4* 5.7*  ALBUMIN 2.8* 3.0*   No results for input(s): LIPASE, AMYLASE in the last 72 hours. Cardiac Enzymes No results for input(s): CKTOTAL, CKMB, CKMBINDEX, TROPONINI in the last 72 hours.  BNP: BNP (last 3 results) Recent Labs    09/16/19 1008 10/06/19 0958  BNP 676.4* 899.1*    ProBNP (last 3 results) Recent Labs    10/02/19 1131  PROBNP 3,116*     D-Dimer No results for input(s): DDIMER in the last 72 hours. Hemoglobin A1C Recent Labs    10/08/19 0853  HGBA1C 8.2*   Fasting Lipid Panel No results for input(s): CHOL, HDL, LDLCALC, TRIG, CHOLHDL, LDLDIRECT in the last 72 hours. Thyroid Function Tests No results for input(s): TSH, T4TOTAL, T3FREE, THYROIDAB in the last 72 hours.  Invalid input(s): FREET3  Other results:   Imaging    No results found.   Medications:     Scheduled Medications: . aspirin EC  81 mg Oral Daily  . atorvastatin  80 mg Oral Daily  . carvedilol  3.125 mg Oral BID WC  . Chlorhexidine Gluconate Cloth   6 each Topical Daily  . digoxin  0.125 mg Oral Daily  . furosemide  80 mg Intravenous BID  . insulin aspart  0-9 Units Subcutaneous Q4H  . insulin glargine  12 Units Subcutaneous Daily  . isosorbide mononitrate  60 mg Oral Daily  . pantoprazole  40 mg Oral Daily  . potassium chloride  40 mEq Oral Once  . sodium chloride flush  10-40 mL Intracatheter Q12H  . sodium chloride flush  3 mL Intravenous Once  . sodium chloride flush  3 mL Intravenous Q12H  . spironolactone  25 mg Oral Daily    Infusions: . sodium chloride    . sodium chloride 10 mL/hr at 10/10/19 0600  . amiodarone 30 mg/hr (10/10/19 0600)  . ferumoxytol 510 mg (10/09/19 0919)  . heparin 1,450 Units/hr (10/10/19 0600)    PRN Medications: sodium chloride, acetaminophen, nitroGLYCERIN, ondansetron (ZOFRAN) IV, sodium chloride flush, sodium chloride flush    Assessment/Plan   1. Acute systolic CHF: Ischemic cardiomyopathy.  Echo this admission with EF 30-35% and wall motion abnormalities, prior echo earlier this month showed EF 50-55%.  Cath earlier this month showed jeopardized ramus and LCx territory (99% distal left main, occluded LAD, patent LIMA-LAD).  Concerned that ischemia in this territory has triggered CHF.  Echo showed lateral wall hypokinesis.  With elevated LFTs and creatinine at admission, there was initial concern for low output/cardiogenic shock physiology but initial co-ox was 67%. No IV inotrope requirements currently. Digoxin added 11/2.  - Lasix increased to 80 mg bid yesterday. Only 1L total documented in UOP yesterday. Net negative 4L total this admit. Remains volume overloaded. CVP 15 today. SCr improved to 1.1 today. May benefit from dose of metolazone to help w/ diuresis, however scheduled for cath today w/ contrast. Will defer to MD.  - Continue spironolactone to 25 mg daily.  - Losartan on hold currently due to soft BP and planned LHC. May resume in next 24-48 hrs if BP and SCr stable.  - Continue  Coreg 3.125 mg bid.  - Continue Imdur 60 mg  - Continue digoxin 0.125 mg 2. CAD: Complicated disease, coronary angiography earlier this month showed patent SVG-RCA territory and patent LIMA-LAD.  The proximal LAD was occluded and there was 99% distal left main stenosis.  This left the ramus and LCx in jeopardy.  He was not a good candidate for protected PCI => cannot place Impella with mechanical aortic valve and peripheral vascular disease likely precludes a femoral IABP.  It was decided to try to manage him medically.  However, he returns with progressive chest pain episodes and NSTEMI, hs-TnI up to 935.  Now chest pain-free at rest.    - INR 1.6, currently on heparin gtt with warfarin on hold.     - Continue  ASA 81 and atorvastatin 80 daily.  - Continue Imdur 60 daily.  - At this point, he is failing medical management and will need intervention on distal left main. Discussed with Dr. Burt Knack, plan PCI today with atherectomy of left main stenosis, LIMA protects the LAD.  Post-PCI antiplatelet/anticoagulation regimen will need to be warfarin + Plavix.  3. Mechanical aortic valve: Stable on echo this admission.  - Continue ASA 81.  - Warfarin on hold for anticipated cath w/ intervention, INR 1.6. Continue heparin gtt.  4. NSVT:amiodarone gtt started.  Improved but still w/ frequent PVCs. Likely due to ischemia. Had sinus pauses x 2 during sleep 11/2, up to 3.2 sec. No recurrence - Continue amiodarone gtt + coreg but monitor closely.  - Replete K and Mg as needed to keep K >4.0 and Mg >2.0.  5. H/o GI bleeding: no overt bleeding (normal-appearing BM since admit).  INR 8 initially, now improved after dose of vit k. INR 1.6. Hgb stable past 72 hrs at 8.5 - Iron low at 8. Ferritin 27. Feraheme started.  6. PAD: Patient with aorto-bifemoral bypass, h/o iliac stenting, h/o femoral endarterectomy, PCI to right SFA in 2017.  - He would not be able to get femoral IABP.  If needed, axillary would be our best  option.  7. H/o renal artery stenosis.  8. AKI: SCr improving w/ diuresis, 1.1 today.  Follow closely.   9. Elevated LFTs: Shock liver versus congestive hepatopathy.  LFTs have been trending down. Do not think that amiodarone is the cause (elevated LFTs prior to amiodarone).  - Continue IV Lasix.  10. DM2: cover with sliding scale.   11. Atrial fibrillation: Atrial fibrillation is new.  Continue heparin gtt, if he does not come out of atrial fibrillation on his own may need DCCV eventually.   Length of Stay: [address] PA-C  10/10/2019, 7:06 AM  Advanced Heart Failure Team Pager [phone] (M-F; 7a - 4p)  Please contact [facility] for night-coverage after hours (4p -7a ) and weekends on [email or web address]  Patient seen with NP, agree with the above note.   He was able to lie down to sleep last night but short of breath walking to the bathroom today.  No chest pain.  CVP is still quite high at 15-16 this morning and he did not diurese well.  However, creatinine better at 1.11.  Co-ox lower today at 49%.    Hgb higher today, no overt GI bleeding.  INR 1.6 today on heparin gtt, FOBT negative.   He remains in atrial fibrillation today on amiodarone gtt, minimal ventricular ectopy.   General: NAD Neck: JVP 14+ cm, no thyromegaly or thyroid nodule.  Lungs: Clear to auscultation bilaterally with normal respiratory effort. CV: Nondisplaced PMI.  Heart irregular S1/S2 with mechanical S2, no S3/S4, 2/6 SEM RUSB.  1+ ankle edema, legs wrapped.   Abdomen: Soft, nontender, no hepatosplenomegaly, no distention.  Skin: Intact without lesions or rashes.  Neurologic: Alert and oriented x 3.  Psych: Normal affect. Extremities: No clubbing or cyanosis.  HEENT: Normal.   Co-ox lower today with CVP still high at 15-16, UOP not vigorous.  Creatinine improved at 1.11 and LFTs trending down.  - Re-send co-ox this morning.  - Continue digoxin, will try to hold off on milrinone for now with atrial  fibrillation (and prior co-ox readings ok).  - Lasix 80 mg IV bid, will give dose of metolazone 2.5 today and replace K, would like to see CVP  lower when he goes into cath lab.  - Hold losartan with prior rise in creatinine, continue spironolactone.   Plan for PCI to LM today (for LCx system, protected LAD with LIMA).  Will need atherectomy.  He is currently on heparin gtt, eventual regimen will need to be warfarin + Plavix.  I will give him 600 mg Plavix this morning.   Hgb 8.5 (higher today). Has had IV Fe.  FOBT negative.  Has history of GI bleeding but no overt bleeding currently.   He remains in atrial fibrillation.  This is a new diagnosis, has been on amiodarone due to NSVT.  Will continue amiodarone for now, may need DCCV if he does not convert on his own. Rate is controlled.   CRITICAL CARE Performed by: Loralie Champagne  Total critical care time: 35 minutes  Critical care time was exclusive of separately billable procedures and treating other patients.  Critical care was necessary to treat or prevent imminent or life-threatening deterioration.  Critical care was time spent personally by me on the following activities: development of treatment plan with patient and/or surrogate as well as nursing, discussions with consultants, evaluation of patient's response to treatment, examination of patient, obtaining history from patient or surrogate, ordering and performing treatments and interventions, ordering and review of laboratory studies, ordering and review of radiographic studies, pulse oximetry and re-evaluation of patient's condition.  Loralie Champagne 10/10/2019 8:04 AM

## 2019-10-10 NOTE — Progress Notes (Signed)
ANTICOAGULATION CONSULT NOTE  Pharmacy Consult for heparin Indication: chest pain/ACS  No Known Allergies  Patient Measurements: Height: 5\' 8"  (172.7 cm) Weight: 176 lb 2.4 oz (79.9 kg) IBW/kg (Calculated) : 68.4 Heparin dosing weight: 79 kg  Vital Signs: Temp: 97.9 F (36.6 C) (11/03 1130) Temp Source: Oral (11/03 1130) BP: 149/105 (11/03 1900) Pulse Rate: 127 (11/03 1900)  Labs: Recent Labs    10/08/19 0244  10/09/19 0450 10/09/19 0505 10/09/19 1630 10/10/19 0528  HGB 8.7*  --  8.2*  --   --  8.5*  HCT 29.0*  --  27.4*  --   --  28.2*  PLT 308  --  282  --   --  292  LABPROT 17.9*  --  19.3*  --   --  18.4*  INR 1.5*  --  1.7*  --   --  1.6*  HEPARINUNFRC  --    < >  --  0.30 0.51 0.32  CREATININE 1.02  --  1.42*  --   --  1.11   < > = values in this interval not displayed.    Estimated Creatinine Clearance: 56.5 mL/min (by C-G formula based on SCr of 1.11 mg/dL).   Assessment: Patient is a 73 year old male who presented with shortness of breath and chest pain. Recent cath showed occluded proxLAD and 99% distal left main stenosis. Planning PCI today with atherectomy of left main stenosis this afternoon.  Patient is on warfarin PTA for mechanical AVR (INR goal 2.5-3.5). Dose: 5 mg daily except 10 mg on Su, Tu, Th  To resume warfarin and heparin this evening (heparin 8 hours after sheath pull, which was about 1745)  Goal of Therapy:  Heparin level 0.3-0.7 units/ml Monitor platelets by anticoagulation protocol: Yes   Plan:  Resume heparin gtt 1450 units/hr @ 0200 Wed Warfarin 10 mg x 1 0800 Hep LVL Daily HL, CBC, INR  Barth Kirks, PharmD, BCPS, BCCCP Clinical Pharmacist [phone]  Please check AMION for all [facility] numbers  10/10/2019 7:41 PM

## 2019-10-10 NOTE — Progress Notes (Signed)
51fr venous sheath aspirated and removed from rfv, manual pressure applied for 5 minutes, then the 56fr arterial sheath was aspirated and removed. Manual pressure applied for an additional 25 minutes. Groin level 0. No S+S of hematoma. Tegaderm dressing applied, bedrest instructions given.   Bilateral dp pulses present with doppler , and location marked with skin marker.   Bedrest begins at 21:15:00

## 2019-10-11 ENCOUNTER — Encounter (HOSPITAL_COMMUNITY): Payer: Self-pay | Admitting: Cardiovascular Disease

## 2019-10-11 DIAGNOSIS — R570 Cardiogenic shock: Secondary | ICD-10-CM

## 2019-10-11 LAB — BASIC METABOLIC PANEL
Anion gap: 9 (ref 5–15)
Anion gap: 14 (ref 5–15)
BUN: 25 mg/dL — ABNORMAL HIGH (ref 8–23)
BUN: 25 mg/dL — ABNORMAL HIGH (ref 8–23)
CO2: 22 mmol/L (ref 22–32)
CO2: 26 mmol/L (ref 22–32)
Calcium: 7.5 mg/dL — ABNORMAL LOW (ref 8.9–10.3)
Calcium: 8.8 mg/dL — ABNORMAL LOW (ref 8.9–10.3)
Chloride: 102 mmol/L (ref 98–111)
Chloride: 88 mmol/L — ABNORMAL LOW (ref 98–111)
Creatinine, Ser: 1.09 mg/dL (ref 0.61–1.24)
Creatinine, Ser: 1.39 mg/dL — ABNORMAL HIGH (ref 0.61–1.24)
GFR calc Af Amer: >60 mL/min (ref >60)
GFR calc Af Amer: 57 mL/min — ABNORMAL LOW (ref >60)
GFR calc non Af Amer: >60 mL/min (ref >60)
GFR calc non Af Amer: 50 mL/min — ABNORMAL LOW (ref >60)
Glucose, Bld: 373 mg/dL — ABNORMAL HIGH (ref 70–99)
Glucose, Bld: 421 mg/dL — ABNORMAL HIGH (ref 70–99)
Potassium: 3.8 mmol/L (ref 3.5–5.1)
Potassium: 3.7 mmol/L (ref 3.5–5.1)
Sodium: 133 mmol/L — ABNORMAL LOW (ref 135–145)
Sodium: 128 mmol/L — ABNORMAL LOW (ref 135–145)

## 2019-10-11 LAB — GLUCOSE, CAPILLARY
Glucose-Capillary: 172 mg/dL — ABNORMAL HIGH (ref 70–99)
Glucose-Capillary: 214 mg/dL — ABNORMAL HIGH (ref 70–99)
Glucose-Capillary: 269 mg/dL — ABNORMAL HIGH (ref 70–99)
Glucose-Capillary: 305 mg/dL — ABNORMAL HIGH (ref 70–99)
Glucose-Capillary: 379 mg/dL — ABNORMAL HIGH (ref 70–99)
Glucose-Capillary: 398 mg/dL — ABNORMAL HIGH (ref 70–99)

## 2019-10-11 LAB — CBC
HCT: 26.3 % — ABNORMAL LOW (ref 39.0–52.0)
HCT: 26.9 % — ABNORMAL LOW (ref 39.0–52.0)
Hemoglobin: 7.8 g/dL — ABNORMAL LOW (ref 13.0–17.0)
Hemoglobin: 8.1 g/dL — ABNORMAL LOW (ref 13.0–17.0)
MCH: 23.4 pg — ABNORMAL LOW (ref 26.0–34.0)
MCH: 23.3 pg — ABNORMAL LOW (ref 26.0–34.0)
MCHC: 29.7 g/dL — ABNORMAL LOW (ref 30.0–36.0)
MCHC: 30.1 g/dL (ref 30.0–36.0)
MCV: 79 fL — ABNORMAL LOW (ref 80.0–100.0)
MCV: 77.5 fL — ABNORMAL LOW (ref 80.0–100.0)
Platelets: 270 10*3/uL (ref 150–400)
Platelets: 269 10*3/uL (ref 150–400)
RBC: 3.33 MIL/uL — ABNORMAL LOW (ref 4.22–5.81)
RBC: 3.47 MIL/uL — ABNORMAL LOW (ref 4.22–5.81)
RDW: 24.9 % — ABNORMAL HIGH (ref 11.5–15.5)
RDW: 25 % — ABNORMAL HIGH (ref 11.5–15.5)
WBC: 9.1 10*3/uL (ref 4.0–10.5)
WBC: 9.2 10*3/uL (ref 4.0–10.5)
nRBC: 47.4 % — ABNORMAL HIGH (ref 0.0–0.2)
nRBC: 58.1 % — ABNORMAL HIGH (ref 0.0–0.2)

## 2019-10-11 LAB — HEPATIC FUNCTION PANEL
ALT: 316 U/L — ABNORMAL HIGH (ref 0–44)
AST: 67 U/L — ABNORMAL HIGH (ref 15–41)
Albumin: 2.5 g/dL — ABNORMAL LOW (ref 3.5–5.0)
Alkaline Phosphatase: 73 U/L (ref 38–126)
Bilirubin, Direct: 0.2 mg/dL (ref 0.0–0.2)
Indirect Bilirubin: 0.6 mg/dL (ref 0.3–0.9)
Total Bilirubin: 0.8 mg/dL (ref 0.3–1.2)
Total Protein: 4.7 g/dL — ABNORMAL LOW (ref 6.5–8.1)

## 2019-10-11 LAB — COOXEMETRY PANEL
Carboxyhemoglobin: 1.2 % (ref 0.5–1.5)
Carboxyhemoglobin: 1.3 % (ref 0.5–1.5)
Methemoglobin: 1.1 % (ref 0.0–1.5)
Methemoglobin: 1.5 % (ref 0.0–1.5)
O2 Saturation: 52.1 %
O2 Saturation: 41.9 %
Total hemoglobin: 7.9 g/dL — ABNORMAL LOW (ref 12.0–16.0)
Total hemoglobin: 8.6 g/dL — ABNORMAL LOW (ref 12.0–16.0)

## 2019-10-11 LAB — PROTIME-INR
INR: 1.6 — ABNORMAL HIGH (ref 0.8–1.2)
Prothrombin Time: 18.9 seconds — ABNORMAL HIGH (ref 11.4–15.2)

## 2019-10-11 LAB — MAGNESIUM: Magnesium: 1.7 mg/dL (ref 1.7–2.4)

## 2019-10-11 LAB — HEPARIN LEVEL (UNFRACTIONATED)
Heparin Unfractionated: >2.2 IU/mL — ABNORMAL HIGH (ref 0.30–0.70)
Heparin Unfractionated: 0.63 IU/mL (ref 0.30–0.70)
Heparin Unfractionated: 0.71 IU/mL — ABNORMAL HIGH (ref 0.30–0.70)

## 2019-10-11 MED ORDER — INSULIN GLARGINE 100 UNIT/ML ~~LOC~~ SOLN
18.0000 [IU] | Freq: Every day | SUBCUTANEOUS | Status: DC
  Administered 2019-10-11 – 2019-10-16 (×6): 18 [IU] via SUBCUTANEOUS
  Filled 2019-10-11 (×8): qty 0.18

## 2019-10-11 MED ORDER — MAGNESIUM SULFATE 2 GM/50ML IV SOLN
2.0000 g | Freq: Once | INTRAVENOUS | Status: AC
  Administered 2019-10-11: 2 g via INTRAVENOUS
  Filled 2019-10-11 (×2): qty 50

## 2019-10-11 MED ORDER — MILRINONE LACTATE IN DEXTROSE 20-5 MG/100ML-% IV SOLN
0.2500 ug/kg/min | INTRAVENOUS | Status: DC
  Administered 2019-10-11 – 2019-10-14 (×5): 0.25 ug/kg/min via INTRAVENOUS
  Filled 2019-10-11 (×9): qty 100

## 2019-10-11 MED ORDER — WARFARIN SODIUM 7.5 MG PO TABS
7.5000 mg | ORAL_TABLET | Freq: Once | ORAL | Status: AC
  Administered 2019-10-11: 7.5 mg via ORAL
  Filled 2019-10-11: qty 1

## 2019-10-11 MED ORDER — FUROSEMIDE 10 MG/ML IJ SOLN
80.0000 mg | Freq: Once | INTRAMUSCULAR | Status: AC
  Administered 2019-10-11: 80 mg via INTRAVENOUS
  Filled 2019-10-11: qty 8

## 2019-10-11 MED ORDER — METOLAZONE 2.5 MG PO TABS
2.5000 mg | ORAL_TABLET | Freq: Once | ORAL | Status: AC
  Administered 2019-10-11: 2.5 mg via ORAL
  Filled 2019-10-11: qty 1

## 2019-10-11 MED ORDER — POTASSIUM CHLORIDE CRYS ER 20 MEQ PO TBCR
40.0000 meq | EXTENDED_RELEASE_TABLET | Freq: Three times a day (TID) | ORAL | Status: AC
  Administered 2019-10-11 – 2019-10-12 (×2): 40 meq via ORAL
  Filled 2019-10-11 (×2): qty 2

## 2019-10-11 MED ORDER — INSULIN ASPART 100 UNIT/ML ~~LOC~~ SOLN
0.0000 [IU] | Freq: Three times a day (TID) | SUBCUTANEOUS | Status: DC
  Administered 2019-10-11: 16:00:00 5 [IU] via SUBCUTANEOUS
  Administered 2019-10-12: 8 [IU] via SUBCUTANEOUS
  Administered 2019-10-12 (×2): 5 [IU] via SUBCUTANEOUS

## 2019-10-11 MED ORDER — FUROSEMIDE 10 MG/ML IJ SOLN
12.0000 mg/h | INTRAVENOUS | Status: DC
  Administered 2019-10-11 (×2): 12 mg/h via INTRAVENOUS
  Filled 2019-10-11 (×2): qty 25

## 2019-10-11 MED ORDER — INSULIN ASPART 100 UNIT/ML ~~LOC~~ SOLN
0.0000 [IU] | Freq: Every day | SUBCUTANEOUS | Status: DC
  Administered 2019-10-11: 2 [IU] via SUBCUTANEOUS
  Administered 2019-10-12 – 2019-10-13 (×2): 3 [IU] via SUBCUTANEOUS
  Administered 2019-10-14: 2 [IU] via SUBCUTANEOUS
  Administered 2019-10-16: 5 [IU] via SUBCUTANEOUS

## 2019-10-11 MED ORDER — POTASSIUM CHLORIDE CRYS ER 20 MEQ PO TBCR
40.0000 meq | EXTENDED_RELEASE_TABLET | Freq: Once | ORAL | Status: AC
  Administered 2019-10-11: 40 meq via ORAL
  Filled 2019-10-11: qty 2

## 2019-10-11 NOTE — Progress Notes (Signed)
Inpatient Diabetes Program Recommendations  AACE/ADA: New Consensus Statement on Inpatient Glycemic Control (2015)  Target Ranges:  Prepandial:   less than 140 mg/dL      Peak postprandial:   less than 180 mg/dL (1-2 hours)      Critically ill patients:  140 - 180 mg/dL   Lab Results  Component Value Date   GLUCAP 305 (H) 10/11/2019   HGBA1C 8.2 (H) 10/08/2019    Review of Glycemic Control Results for SARGENT, MANKEY (MRN [id number]) as of 10/11/2019 14:02  Ref. Range 10/10/2019 19:49 10/10/2019 23:59 10/11/2019 04:15 10/11/2019 08:31 10/11/2019 12:35  Glucose-Capillary Latest Ref Range: 70 - 99 mg/dL 195 (H) 269 (H) 379 (H) 398 (H) 305 (H)   Diabetes history: DM2 Outpatient Diabetes medications: Glyburide 2.5 mg qd + Invokana 300 mg qd Current orders for Inpatient glycemic control: Lantus 12 units qd + Novolog sensitive correction q 4 hrs.  Inpatient Diabetes Program Recommendations:   -Add Novolog 5 units tid meal coverage if eats 50% -Change diet to carb modified  Thank you, Bethena Roys E. Keyoni Lapinski, RN, MSN, CDE  Diabetes Coordinator Inpatient Glycemic Control Team Team Pager [phone] (8am-5pm) 10/11/2019 2:03 PM

## 2019-10-11 NOTE — Progress Notes (Addendum)
Standard for heparin and warfarin Indication: mech AVR and new AFib  No Known Allergies  Patient Measurements: Height: 5\' 8"  (172.7 cm) Weight: 179 lb 7.3 oz (81.4 kg) IBW/kg (Calculated) : 68.4 Heparin dosing weight: 79 kg  Vital Signs: Temp: 98.2 F (36.8 C) (11/04 1222) Temp Source: Oral (11/04 1222) BP: 100/51 (11/04 1222) Pulse Rate: 55 (11/04 1222)  Labs: Recent Labs    10/09/19 0450 10/09/19 0505 10/09/19 1630 10/10/19 0528 10/10/19 1933 10/11/19 0441  HGB 8.2*  --   --  8.5* 10.2* 7.8*  HCT 27.4*  --   --  28.2* 30.0* 26.3*  PLT 282  --   --  292  --  270  LABPROT 19.3*  --   --  18.4*  --  18.9*  INR 1.7*  --   --  1.6*  --  1.6*  HEPARINUNFRC  --  0.30 0.51 0.32  --   --   CREATININE 1.42*  --   --  1.11  --  1.09    Estimated Creatinine Clearance: 57.5 mL/min (by C-G formula based on SCr of 1.09 mg/dL).   Assessment: Patient is a 73 year old male who presented with shortness of breath and chest pain. Recent cath showed occluded proxLAD and 99% distal left main stenosis. Underwent PCI on 11/3 with placement of DES to LM and placed on triple therapy.  Patient is on warfarin PTA for mechanical AVR (INR goal 2.5-3.5). Dose: 5 mg daily except 10 mg on Su, Tu, Th  Heparin was restarted around 0530 this AM. Heparin level is therapeutic at 0.63 on drip rate of 1450 units/hr. Warfarin was also restarted 11/3 and INR is 1.6 after first dose. Hgb improved this PM at 8.1, plts remain wnl. No overt bleeding or infusion issues noted. Will continue current heparin rate and give slightly higher warfarin dose compared to PTA regimen.  Goal of Therapy:  INR 2.5-3.5 Heparin level 0.3-0.7 units/ml Monitor platelets by anticoagulation protocol: Yes   Plan:  Continue heparin gtt at 1450 units/hr Warfarin 7.5 mg PO x1 tonight Check 8-hr confirmatory HL Daily HL, CBC, INR Monitor for bleeding  Richardine Service, PharmD PGY1 Pharmacy  Resident Phone: [phone] 10/11/2019  1:11 PM  Please check [email or web address] for unit-specific pharmacy phone numbers.

## 2019-10-11 NOTE — Progress Notes (Signed)
Paged cardiology [place] on call due to RN questioning heparin gtt restart with right groin level 1 and leaking throughout the night. Awaiting further orders

## 2019-10-11 NOTE — Discharge Instructions (Addendum)
De-escalation of Triple Therapy Post-PCI  Underwent cardiac catheterization with placement of drug-eluting stent on 10/10/19. Plan for triple therapy with aspirin 81 mg and clopidogrel (Plavix) in addition to oral anticoagulation using warfarin (Coumadin). Plan to discontinue aspirin on 12/3.

## 2019-10-11 NOTE — Progress Notes (Addendum)
Patient ID: [name] [name], male   DOB: July 14, 1946, 73 y.o.   MRN: [id number]     Advanced Heart Failure Rounding Note  PCP-Cardiologist: Jenkins Rouge, MD   Subjective:    S/p successful orbital atherectomy, PTCA, and stenting of the left main with a 4.0x15 mm Resolute Onyx DES 11/3. LIMA-LAD patent. SVG-PDA patent. Denies CP.   Hgb 8.5>>7.8. Rt femoral access site soft. No hematoma. Denies anterior groin, flank and LBP. BP stable. MAP 90  Improved diuresis yesterday w/ addition of metolazone. -2.8 L out. Net negative 5.7 L total. SCr stable at 1.09. K 3.8. CVPs measured ~9 but remains volume overloaded on physical exam. Co-ox low at 52%. ? Due to low hgb.   Initially presented w/ supratherapeutic INR on admit at 8.0. He got vitamin K for reversal. INR 1.6 today. No overt GI bleeding. Low transferrin saturation at 2%. Started on feraheme 11/2.   Echo: EF 30-35%,  Anterior and anteroseptal hypokinesis, basal-mid inferolateral and anterolateral hypokinesis, normal RV, mechanical aortic valve looks ok, dilated IVC.   Remains in afib w/ frequent PVCs. CVR in the 90s.   Objective:   Weight Range: 81.4 kg Body mass index is 27.29 kg/m.   Vital Signs:   Temp:  [97.9 F (36.6 C)-98.9 F (37.2 C)] 98.7 F (37.1 C) (11/04 0400) Pulse Rate:  [30-127] 83 (11/04 0700) Resp:  [10-26] 26 (11/04 0700) BP: (65-177)/(28-119) 101/84 (11/04 0700) SpO2:  [84 %-100 %] 94 % (11/04 0700) Weight:  [79.9 kg-81.4 kg] 81.4 kg (11/04 0439) Last BM Date: 10/10/19  Weight change: Filed Weights   10/10/19 0500 10/10/19 0800 10/11/19 0439  Weight: 81.5 kg 79.9 kg 81.4 kg    Intake/Output:   Intake/Output Summary (Last 24 hours) at 10/11/2019 0706 Last data filed at 10/11/2019 0700 Gross per 24 hour  Intake 1112.6 ml  Output 2800 ml  Net -1687.4 ml      Physical Exam    PHYSICAL EXAM: General:  Well appearing elderly WM. No respiratory difficulty HEENT: normal Neck: supple. elevated  JVD. Carotids 2+ bilat; no bruits. No lymphadenopathy or thyromegaly appreciated. Cor: PMI nondisplaced. irregularly Irregular rhythm, tachy rate. No rubs, gallops or murmurs. Lungs: clear Abdomen: soft, nontender, nondistended. No hepatosplenomegaly. No bruits or masses. Good bowel sounds. Rt Groin: soft, mildly tender. No ecchymosis or hematoma Extremities: no cyanosis, clubbing, rash, edema Neuro: alert & oriented x 3, cranial nerves grossly intact. moves all 4 extremities w/o difficulty. Affect pleasant.   Telemetry   Afib w/ CVR in the 90s, frequent PVCs, 7 beats of NSVT  EKG    N/a   Labs    CBC Recent Labs    10/10/19 0528 10/10/19 1933 10/11/19 0441  WBC 7.7  --  9.1  HGB 8.5* 10.2* 7.8*  HCT 28.2* 30.0* 26.3*  MCV 78.1*  --  79.0*  PLT 292  --  357   Basic Metabolic Panel Recent Labs    10/10/19 0528 10/10/19 1933 10/11/19 0441  NA 133* 134* 133*  K 3.5 3.8 3.8  CL 96*  --  102  CO2 25  --  22  GLUCOSE 180*  --  373*  BUN 28*  --  25*  CREATININE 1.11  --  1.09  CALCIUM 8.5*  --  7.5*  MG 1.8  --  1.7   Liver Function Tests Recent Labs    10/10/19 0528 10/11/19 0441  AST 119* 67*  ALT 471* 316*  ALKPHOS 73 73  BILITOT 1.1  0.8  PROT 5.7* 4.7*  ALBUMIN 3.0* 2.5*   No results for input(s): LIPASE, AMYLASE in the last 72 hours. Cardiac Enzymes No results for input(s): CKTOTAL, CKMB, CKMBINDEX, TROPONINI in the last 72 hours.  BNP: BNP (last 3 results) Recent Labs    09/16/19 1008 10/06/19 0958  BNP 676.4* 899.1*    ProBNP (last 3 results) Recent Labs    10/02/19 1131  PROBNP 3,116*     D-Dimer No results for input(s): DDIMER in the last 72 hours. Hemoglobin A1C Recent Labs    10/08/19 0853  HGBA1C 8.2*   Fasting Lipid Panel No results for input(s): CHOL, HDL, LDLCALC, TRIG, CHOLHDL, LDLDIRECT in the last 72 hours. Thyroid Function Tests No results for input(s): TSH, T4TOTAL, T3FREE, THYROIDAB in the last 72 hours.   Invalid input(s): FREET3  Other results:   Imaging    No results found.   Medications:     Scheduled Medications: . aspirin EC  81 mg Oral Daily  . atorvastatin  80 mg Oral Daily  . carvedilol  3.125 mg Oral BID WC  . Chlorhexidine Gluconate Cloth  6 each Topical Daily  . clopidogrel  75 mg Oral Daily  . digoxin  0.125 mg Oral Daily  . furosemide  80 mg Intravenous BID  . insulin aspart  0-9 Units Subcutaneous Q4H  . insulin glargine  12 Units Subcutaneous Daily  . isosorbide mononitrate  60 mg Oral Daily  . pantoprazole  40 mg Oral Daily  . potassium chloride  40 mEq Oral Once  . sodium chloride flush  10-40 mL Intracatheter Q12H  . sodium chloride flush  3 mL Intravenous Once  . sodium chloride flush  3 mL Intravenous Q12H  . sodium chloride flush  3 mL Intravenous Q12H  . spironolactone  25 mg Oral Daily  . Warfarin - Pharmacist Dosing Inpatient   Does not apply q1800    Infusions: . sodium chloride    . amiodarone 30 mg/hr (10/11/19 0700)  . ferumoxytol 510 mg (10/09/19 0919)  . heparin 1,450 Units/hr (10/11/19 0700)    PRN Medications: sodium chloride, acetaminophen, nitroGLYCERIN, ondansetron (ZOFRAN) IV, sodium chloride flush, sodium chloride flush    Assessment/Plan   1. Acute systolic CHF: Ischemic cardiomyopathy.  Echo this admission with EF 30-35% and wall motion abnormalities, prior echo earlier this month showed EF 50-55%.  Cath earlier this month showed jeopardized ramus and LCx territory (99% distal left main, occluded LAD, patent LIMA-LAD).  Concerned that ischemia in this territory has triggered CHF.  Echo showed lateral wall hypokinesis.  With elevated LFTs and creatinine at admission, there was initial concern for low output/cardiogenic shock physiology but initial co-ox was 67%. No IV inotrope requirements currently. Digoxin added 11/2.  - Remains volume overloaded on exam. CVP ~9 today. SCr stable. Good response to metolazone yesterday.  -  Continue IV lasix 80 bid. Give another dose of metolazone 2.5 mg today.  - Continue spironolactone to 25 mg daily.  - Losartan held yesterday for soft BP and reno protection precath. Now s/p revascularization of LM, can reduce dose of Imdur to allow more BP room for ARB/ARNI.  - Continue Coreg 3.125 mg bid.  - Continue Imdur 60 mg  - Continue digoxin 0.125 mg 2. CAD: Complicated disease, coronary angiography earlier this month showed patent SVG-RCA territory and patent LIMA-LAD.  The proximal LAD was occluded and there was 99% distal left main stenosis.  This left the ramus and LCx in jeopardy.  He  was not a good candidate for protected PCI => cannot place Impella with mechanical aortic valve and peripheral vascular disease likely precludes a femoral IABP.  It was decided to try to manage him medically.  However, he returned with progressive chest pain episodes and NSTEMI, hs-TnI up to 935.  S/p successful orbital atherectomy, PTCA, and stenting of the left main with a 4.0x15 mm Resolute Onyx DES 11/3. Stable w/o CP -Plan triple therapy w/ ASA, Plavix + Coumadin x 30 days. Stop ASA after 1 month. Plavix for at least 6 months if tolerated.  -PPI for GI protection, Protonix 40  -Continue atorvastatin 80 daily. LDL goal <70.  -Can reduce dose/ ? Complete discontinuation of Imdur now post PCI  3. Mechanical aortic valve: Stable on echo this admission.  - Continue ASA 81.  - INR 1.6 today. Bridging w/ IV heparin.  4. NSVT:amiodarone gtt started.  Improved but still w/ frequent PVCs. Likely due to ischemia. Had sinus pauses x 2 during sleep 11/2, up to 3.2 sec. No recurrence - Continue amiodarone gtt + coreg but monitor closely.  - Replete K and Mg as needed to keep K >4.0 and Mg >2.0.  5. H/o GI bleeding: no overt bleeding (normal-appearing BM since admit).  INR 8 initially, now improved after dose of vit k. INR 1.6. drop in Hgb post cath, 10.2>>7.8. Groin stable. Will repeat CBC. Requiring triple  therapy x 30 days for newly placed LM DES + Afib and mechanical AV. - Monitor H/H closely.  -Continue PPI for GI protection. -Iron low at 8. Ferritin 27. Feraheme started.  6. PAD: Patient with aorto-bifemoral bypass, h/o iliac stenting, h/o femoral endarterectomy, PCI to right SFA in 2017.  7. H/o renal artery stenosis.  8. AKI: SCr improving w/ diuresis, 1.0 today.  Follow closely.   9. Elevated LFTs: Shock liver versus congestive hepatopathy.  LFTs have been trending down. Do not think that amiodarone is the cause (elevated LFTs prior to amiodarone).  - Continue IV Lasix.  10. DM2: cover with sliding scale.   11. Atrial fibrillation: Atrial fibrillation is new.  Continue heparin gtt + amiodarone. I - f he does not come out of atrial fibrillation on his own may need DCCV eventually.   Length of Stay: [address], PA-C  10/11/2019, 7:06 AM  Advanced Heart Failure Team Pager [phone] (M-F; 7a - 4p)  Please contact [facility] for night-coverage after hours (4p -7a ) and weekends on [email or web address]  Patient seen with PA, agree with the above note.    Patient had successful DES to left main yesterday.    He remains short of breath with any exertion.  He remains in atrial fibrillation with controlled rate.  Co-ox 52%, repeated at 42%.  No chest pain.  He diuresed some with IV Lasix and metolazone yesterday but CVP 20 on my measure this morning.   General: NAD Neck: JVP 16 cm, no thyromegaly or thyroid nodule.  Lungs: Crackles at bases. CV: Nondisplaced PMI.  Heart irregular S1/S2 with mechanical S2, no S3/S4, no murmur.  1+ ankle edema.  Abdomen: Soft, nontender, no hepatosplenomegaly, no distention.  Skin: Intact without lesions or rashes.  Neurologic: Alert and oriented x 3.  Psych: Normal affect. Extremities: No clubbing or cyanosis.  HEENT: Normal.   Successful LM PCI.  He is now on ASA 81 (continue 1 month), Plavix 75 daily, heparin gtt bridging to therapeutic warfarin  (with mechanical AoV).  No overt bleeding but hgb down 8.5 =>  7.8.  Right groin cath site looks ok.   - Will follow today, repeat CBC in afternoon.  Transfuse 1 unit if hgb drops any lower.    Patient remains markedly volume overloaded with CVP 20, some diuresis yesterday but needs considerably more.  Co-ox lower 52% => 42%.   - I am going to start milrinone 0.25 while diuresing, follow HR closely but on amiodarone gtt for rate control.  - Lasix 80 mg IV x 1 then 12 mg/hr + metolazone 2.5.  Replace K, repeat BMET in pm.  - Continue digoxin and spironolactone.   Patient remains in atrial fibrillation rate in 70s on amiodarone gtt. When he is better-diuresed and milrinone weaned, will need TEE-guided DCCV.   CRITICAL CARE Performed by: Loralie Champagne  Total critical care time: 40 minutes  Critical care time was exclusive of separately billable procedures and treating other patients.  Critical care was necessary to treat or prevent imminent or life-threatening deterioration.  Critical care was time spent personally by me on the following activities: development of treatment plan with patient and/or surrogate as well as nursing, discussions with consultants, evaluation of patient's response to treatment, examination of patient, obtaining history from patient or surrogate, ordering and performing treatments and interventions, ordering and review of laboratory studies, ordering and review of radiographic studies, pulse oximetry and re-evaluation of patient's condition.  Loralie Champagne 10/11/2019 8:29 AM

## 2019-10-11 NOTE — Plan of Care (Signed)
  Problem: Education: Goal: Knowledge of General Education information will improve Description: Including pain rating scale, medication(s)/side effects and non-pharmacologic comfort measures Outcome: Progressing  Pt is alert and oriented, verbalizes understanding of nursing interventions and medications scheduled. Problem: Clinical Measurements: Goal: Cardiovascular complication will be avoided Outcome: Progressing  Pt's AFIB has mantained rate controlled in the 90s, blood pressure has varied throughout the night. Pt continues to deny dizziness and lightheadedness. Problem: Coping: Goal: Level of anxiety will decrease Outcome: Progressing  Pt has been calm and cooperative, voices anticipation for ambulation later on today. Problem: Pain Managment: Goal: General experience of comfort will improve Outcome: Progressing  Pt denies chest pain but does report shoulder discomfort from lying flat throughout the night, heating pads have been provided. Problem: Education: Goal: Ability to verbalize understanding of medication therapies will improve Outcome: Progressing   Problem: Cardiac: Goal: Ability to achieve and maintain adequate cardiopulmonary perfusion will improve Outcome: Progressing

## 2019-10-11 NOTE — Progress Notes (Signed)
Paged Arps MD about restarting heparin gtt with hemoglobin decrease. MD and RN both agreed gtt should be restarted as scheduled per order due to mechanical valve and risk for clots. Less oozing from right groin noted.

## 2019-10-11 NOTE — Progress Notes (Signed)
Pt's right groin site has leaked throughout the night, minimal bruising noted, firmness has not spread from first initial assessment post sheath removal, dressing has been changed twice to assess bruising under gauze and due to saturated dressing. Bilateral dopplers present with doppler, extremities are warm to touch, and capillary refill less than 3 seconds. Groin level of 1. Will continue to monitor.

## 2019-10-11 NOTE — Plan of Care (Signed)
Neuro intact. OOB to chair all day, walked around unit this afternoon with Cardiac Rehab. Remains in Afib- rate controlled. Had BM today. Started on Milrinone and Lasix gtts by HF team. Diuresing well. Good appetite. Changes made to insulin management due to elevated CBGs.   Problem: Safety: Goal: Ability to remain free from injury will improve Outcome: Progressing   Problem: Education: Goal: Ability to demonstrate management of disease process will improve Outcome: Progressing   Problem: Activity: Goal: Ability to return to baseline activity level will improve Outcome: Progressing

## 2019-10-11 NOTE — Progress Notes (Signed)
CARDIAC REHAB PHASE I   PRE:  Rate/Rhythm: 64 afib    BP: sitting 138/37    SaO2: 100 2L  MODE:  Ambulation: 230 ft   POST:  Rate/Rhythm: 73 afib    BP: sitting 145/130, recheck 140/116     SaO2: 96 2L  Pt able to stand and ambulate with RW and 2L O2.  Denied CP, some SOB. Overall did well. Return to recliner.  ?BP accuracy after walk. [place], ACSM 10/11/2019 3:18 PM

## 2019-10-12 LAB — GLUCOSE, CAPILLARY
Glucose-Capillary: 243 mg/dL — ABNORMAL HIGH (ref 70–99)
Glucose-Capillary: 212 mg/dL — ABNORMAL HIGH (ref 70–99)
Glucose-Capillary: 320 mg/dL — ABNORMAL HIGH (ref 70–99)
Glucose-Capillary: 231 mg/dL — ABNORMAL HIGH (ref 70–99)
Glucose-Capillary: 249 mg/dL — ABNORMAL HIGH (ref 70–99)
Glucose-Capillary: 283 mg/dL — ABNORMAL HIGH (ref 70–99)
Glucose-Capillary: 285 mg/dL — ABNORMAL HIGH (ref 70–99)
Glucose-Capillary: 261 mg/dL — ABNORMAL HIGH (ref 70–99)
Glucose-Capillary: 220 mg/dL — ABNORMAL HIGH (ref 70–99)
Glucose-Capillary: 276 mg/dL — ABNORMAL HIGH (ref 70–99)
Glucose-Capillary: 275 mg/dL — ABNORMAL HIGH (ref 70–99)

## 2019-10-12 LAB — BASIC METABOLIC PANEL
Anion gap: 11 (ref 5–15)
BUN: 23 mg/dL (ref 8–23)
CO2: 30 mmol/L (ref 22–32)
Calcium: 8.8 mg/dL — ABNORMAL LOW (ref 8.9–10.3)
Chloride: 91 mmol/L — ABNORMAL LOW (ref 98–111)
Creatinine, Ser: 1.2 mg/dL (ref 0.61–1.24)
GFR calc Af Amer: >60 mL/min (ref >60)
GFR calc non Af Amer: 59 mL/min — ABNORMAL LOW (ref >60)
Glucose, Bld: 257 mg/dL — ABNORMAL HIGH (ref 70–99)
Potassium: 3.5 mmol/L (ref 3.5–5.1)
Sodium: 132 mmol/L — ABNORMAL LOW (ref 135–145)

## 2019-10-12 LAB — COOXEMETRY PANEL
Carboxyhemoglobin: 1.6 % — ABNORMAL HIGH (ref 0.5–1.5)
Carboxyhemoglobin: 1.5 % (ref 0.5–1.5)
Methemoglobin: 1.5 % (ref 0.0–1.5)
Methemoglobin: 1.4 % (ref 0.0–1.5)
O2 Saturation: 45.4 %
O2 Saturation: 44.4 %
Total hemoglobin: 8.7 g/dL — ABNORMAL LOW (ref 12.0–16.0)
Total hemoglobin: 11.1 g/dL — ABNORMAL LOW (ref 12.0–16.0)

## 2019-10-12 LAB — PROTIME-INR
INR: 2 — ABNORMAL HIGH (ref 0.8–1.2)
Prothrombin Time: 22.7 s — ABNORMAL HIGH (ref 11.4–15.2)

## 2019-10-12 LAB — HEPARIN LEVEL (UNFRACTIONATED): Heparin Unfractionated: 0.63 [IU]/mL (ref 0.30–0.70)

## 2019-10-12 LAB — CBC
HCT: 27 % — ABNORMAL LOW (ref 39.0–52.0)
HCT: 27.5 % — ABNORMAL LOW (ref 39.0–52.0)
Hemoglobin: 8.1 g/dL — ABNORMAL LOW (ref 13.0–17.0)
Hemoglobin: 8.3 g/dL — ABNORMAL LOW (ref 13.0–17.0)
MCH: 23.1 pg — ABNORMAL LOW (ref 26.0–34.0)
MCH: 23.7 pg — ABNORMAL LOW (ref 26.0–34.0)
MCHC: 30 g/dL (ref 30.0–36.0)
MCHC: 30.2 g/dL (ref 30.0–36.0)
MCV: 76.9 fL — ABNORMAL LOW (ref 80.0–100.0)
MCV: 78.6 fL — ABNORMAL LOW (ref 80.0–100.0)
Platelets: 273 K/uL (ref 150–400)
Platelets: 277 10*3/uL (ref 150–400)
RBC: 3.51 MIL/uL — ABNORMAL LOW (ref 4.22–5.81)
RBC: 3.5 MIL/uL — ABNORMAL LOW (ref 4.22–5.81)
RDW: 25.6 % — ABNORMAL HIGH (ref 11.5–15.5)
RDW: 26.3 % — ABNORMAL HIGH (ref 11.5–15.5)
WBC: 10.2 K/uL (ref 4.0–10.5)
WBC: 10.2 10*3/uL (ref 4.0–10.5)
nRBC: 47 % — ABNORMAL HIGH (ref 0.0–0.2)
nRBC: 34.5 % — ABNORMAL HIGH (ref 0.0–0.2)

## 2019-10-12 LAB — MAGNESIUM: Magnesium: 1.7 mg/dL (ref 1.7–2.4)

## 2019-10-12 MED ORDER — WARFARIN SODIUM 7.5 MG PO TABS
7.5000 mg | ORAL_TABLET | Freq: Once | ORAL | Status: AC
  Administered 2019-10-12: 7.5 mg via ORAL
  Filled 2019-10-12: qty 1

## 2019-10-12 MED ORDER — POTASSIUM CHLORIDE CRYS ER 20 MEQ PO TBCR: 40.0000 meq | EXTENDED_RELEASE_TABLET | Freq: Once | ORAL | Status: AC

## 2019-10-12 MED ORDER — SODIUM CHLORIDE 0.9% FLUSH: 3.0000 mL | INTRAVENOUS | Status: DC | PRN

## 2019-10-12 MED ORDER — SODIUM CHLORIDE 0.9 % IV SOLN: 250.0000 mL | INTRAVENOUS | Status: DC

## 2019-10-12 MED ORDER — POTASSIUM CHLORIDE CRYS ER 20 MEQ PO TBCR
40.0000 meq | EXTENDED_RELEASE_TABLET | Freq: Once | ORAL | Status: AC
  Administered 2019-10-12: 40 meq via ORAL
  Filled 2019-10-12: qty 2

## 2019-10-12 MED ORDER — INSULIN ASPART 100 UNIT/ML ~~LOC~~ SOLN
4.0000 [IU] | Freq: Three times a day (TID) | SUBCUTANEOUS | Status: DC
  Administered 2019-10-13 – 2019-10-17 (×11): 4 [IU] via SUBCUTANEOUS

## 2019-10-12 MED ORDER — MAGNESIUM SULFATE IN D5W 1-5 GM/100ML-% IV SOLN
1.0000 g | Freq: Once | INTRAVENOUS | Status: AC
  Administered 2019-10-12: 05:00:00 1 g via INTRAVENOUS
  Filled 2019-10-12: qty 100

## 2019-10-12 MED ORDER — INSULIN ASPART 100 UNIT/ML ~~LOC~~ SOLN
0.0000 [IU] | Freq: Three times a day (TID) | SUBCUTANEOUS | Status: DC
  Administered 2019-10-13: 3 [IU] via SUBCUTANEOUS
  Administered 2019-10-13: 15 [IU] via SUBCUTANEOUS
  Administered 2019-10-13: 8 [IU] via SUBCUTANEOUS
  Administered 2019-10-14: 2 [IU] via SUBCUTANEOUS
  Administered 2019-10-14: 3 [IU] via SUBCUTANEOUS
  Administered 2019-10-14 – 2019-10-15 (×2): 11 [IU] via SUBCUTANEOUS
  Administered 2019-10-15: 2 [IU] via SUBCUTANEOUS
  Administered 2019-10-16: 5 [IU] via SUBCUTANEOUS
  Administered 2019-10-17: 15 [IU] via SUBCUTANEOUS
  Administered 2019-10-17: 3 [IU] via SUBCUTANEOUS

## 2019-10-12 MED ORDER — SODIUM CHLORIDE 0.9% FLUSH
3.0000 mL | Freq: Two times a day (BID) | INTRAVENOUS | Status: DC
  Administered 2019-10-12 – 2019-10-16 (×6): 3 mL via INTRAVENOUS

## 2019-10-12 MED ORDER — LOSARTAN POTASSIUM 25 MG PO TABS
12.5000 mg | ORAL_TABLET | Freq: Every day | ORAL | Status: DC
  Administered 2019-10-12 – 2019-10-13 (×2): 12.5 mg via ORAL
  Filled 2019-10-12 (×2): qty 1

## 2019-10-12 MED ORDER — CARVEDILOL 3.125 MG PO TABS
3.1250 mg | ORAL_TABLET | Freq: Two times a day (BID) | ORAL | Status: DC
  Administered 2019-10-13: 3.125 mg via ORAL
  Filled 2019-10-12 (×2): qty 1

## 2019-10-12 MED ORDER — MAGNESIUM SULFATE 2 GM/50ML IV SOLN
2.0000 g | Freq: Once | INTRAVENOUS | Status: AC
  Administered 2019-10-12: 2 g via INTRAVENOUS
  Filled 2019-10-12: qty 50

## 2019-10-12 NOTE — Progress Notes (Signed)
CARDIAC REHAB PHASE I   PRE:  Rate/Rhythm: 65 afbi    BP: sitting 127/105    SaO2: 96 RA  MODE:  Ambulation: 450 ft   POST:  Rate/Rhythm: 71 afib    BP: sitting 116/93     SaO2: 100 RA  Pt eager to walk. Had him push IV pole instead of RW today (2 IV poles). Fairly steady. Stood by window for a while then more tired on return to room. Some SOB and legs fatigued. Began education including MI, stent, HF, Afib, Plavix/Coumadin, low sodium, daily wts, and CRPII. Will refer to [facility] (pt did in '04). Will f/u tomorrow. Gave materials and videos for him to review. Pt eager.  [place], ACSM 10/12/2019 1:49 PM

## 2019-10-12 NOTE — Progress Notes (Signed)
[place] for Heparin/Warfarin Indication: mech AVR and new AFib  No Known Allergies  Patient Measurements: Height: 5\' 8"  (172.7 cm) Weight: 179 lb 7.3 oz (81.4 kg) IBW/kg (Calculated) : 68.4 Heparin dosing weight: 79 kg  Vital Signs: Temp: 98 F (36.7 C) (11/04 1957) Temp Source: Oral (11/04 1957) BP: 136/38 (11/04 2300) Pulse Rate: 64 (11/04 2330)  Labs: Recent Labs    10/09/19 0450  10/10/19 0528 10/10/19 1933 10/11/19 0441 10/11/19 1340 10/11/19 1658 10/11/19 2303  HGB 8.2*  --  8.5* 10.2* 7.8* 8.1*  --   --   HCT 27.4*  --  28.2* 30.0* 26.3* 26.9*  --   --   PLT 282  --  292  --  270 269  --   --   LABPROT 19.3*  --  18.4*  --  18.9*  --   --   --   INR 1.7*  --  1.6*  --  1.6*  --   --   --   HEPARINUNFRC  --    < > 0.32  --   --  >2.20* 0.63 0.71*  CREATININE 1.42*  --  1.11  --  1.09 1.39*  --   --    < > = values in this interval not displayed.    Estimated Creatinine Clearance: 45.1 mL/min (A) (by C-G formula based on SCr of 1.39 mg/dL (H)).   Assessment: Patient is a 73 year old male who presented with shortness of breath and chest pain. Recent cath showed occluded proxLAD and 99% distal left main stenosis. Underwent PCI on 11/3 with placement of DES to LM and placed on triple therapy.  Patient is on warfarin PTA for mechanical AVR (INR goal 2.5-3.5). Dose: 5 mg daily except 10 mg on Su, Tu, Th  Heparin was restarted around 0530 this AM. Heparin level is therapeutic at 0.63 on drip rate of 1450 units/hr. Warfarin was also restarted 11/3 and INR is 1.6 after first dose. Hgb improved this PM at 8.1, plts remain wnl. No overt bleeding or infusion issues noted. Will continue current heparin rate and give slightly higher warfarin dose compared to PTA regimen.  11/5 AM update:  Heparin level just above goal tonight No issues per RN   Goal of Therapy:  INR 2.5-3.5 Heparin level 0.3-0.7 units/ml Monitor platelets by  anticoagulation protocol: Yes   Plan:  Dec heparin to 1300 units/hr Re-check heparin level in 8 hours  Narda Bonds, PharmD, [facility] Pharmacist Phone: [phone]

## 2019-10-12 NOTE — Progress Notes (Signed)
Inpatient Diabetes Program Recommendations  AACE/ADA: New Consensus Statement on Inpatient Glycemic Control (2015)  Target Ranges:  Prepandial:   less than 140 mg/dL      Peak postprandial:   less than 180 mg/dL (1-2 hours)      Critically ill patients:  140 - 180 mg/dL   Lab Results  Component Value Date   GLUCAP 283 (H) 10/12/2019   HGBA1C 8.2 (H) 10/08/2019    Review of Glycemic Control Results for [name] [name], [name] [name] (MRN [id number]) as of 10/12/2019 10:17  Ref. Range 10/12/2019 03:25 10/12/2019 06:27 10/12/2019 08:21  Glucose-Capillary Latest Ref Range: 70 - 99 mg/dL 231 (H) 249 (H) 283 (H)   Diabetes history:DM2 Outpatient Diabetes medications:Glyburide 2.5 mg qd + Invokana 300 mg qd Current orders for Inpatient glycemic control:Lantus 18 units qd,  Novolog 0-15 units TID, Novolog 0-5 units QHS  Inpatient Diabetes Program Recommendations:   -Increase Lantus to 22 units QD. -Add Novolog 3 units tid meal coverage if eats 50%.  Thanks, Bronson Curb, MSN, RNC-OB Diabetes Coordinator [phone] (8a-5p)

## 2019-10-12 NOTE — Progress Notes (Signed)
[place] for Heparin/Warfarin Indication: mech AVR and new AFib  No Known Allergies  Patient Measurements: Height: 5\' 8"  (172.7 cm) Weight: 179 lb 7.3 oz (81.4 kg) IBW/kg (Calculated) : 68.4 Heparin dosing weight: 79 kg  Vital Signs: Temp: 98.1 F (36.7 C) (11/05 0400) Temp Source: Oral (11/05 0400) BP: 147/43 (11/05 0400) Pulse Rate: 68 (11/05 0400)  Labs: Recent Labs    10/10/19 0528  10/11/19 0441 10/11/19 1340 10/11/19 1658 10/11/19 2303 10/12/19 0306  HGB 8.5*   < > 7.8* 8.1*  --   --  8.1*  HCT 28.2*   < > 26.3* 26.9*  --   --  27.0*  PLT 292  --  270 269  --   --  273  LABPROT 18.4*  --  18.9*  --   --   --  22.7*  INR 1.6*  --  1.6*  --   --   --  2.0*  HEPARINUNFRC 0.32  --   --  >2.20* 0.63 0.71*  --   CREATININE 1.11  --  1.09 1.39*  --   --  1.20   < > = values in this interval not displayed.    Estimated Creatinine Clearance: 52.3 mL/min (by C-G formula based on SCr of 1.2 mg/dL).   Assessment: Patient is a 73 year old male who presented with shortness of breath and chest pain. Recent cath showed occluded proxLAD and 99% distal left main stenosis. Underwent PCI on 11/3 with placement of DES to LM and placed on triple therapy.  Patient is on warfarin PTA for mechanical AVR (INR goal 2.5-3.5). Dose: 5 mg daily except 10 mg on Su, Tu, Th  INR subtherapeutic at 2.0 after two doses of warfarin. Has a higher INR goal given mechanical AVR with new onset AF, but given that he is on triple therapy, will target the lower end of higher goal (2.5-3.0)  Heparin level therapeutic at 0.63 after decreasing drip rate to 1300 units/hr given slightly high level overnight. CBC stable with no overt bleeding or infusion issues noted.  Goal of Therapy:  INR 2.5-3.0 Heparin level 0.3-0.7 units/ml Monitor platelets by anticoagulation protocol: Yes   Plan:  Continue heparin at 1300 units/hr Warfarin 7.5 mg PO x1 tonight Daily HL,  CBC, INR Monitor for bleeding  Richardine Service, PharmD PGY1 Pharmacy Resident Phone: [phone] 10/12/2019  10:43 AM  Please check [email or web address] for unit-specific pharmacy phone numbers.

## 2019-10-12 NOTE — Progress Notes (Signed)
  3:17 PM: Was called to bedside by RN regarding hypotension w/ drop in SBP in the 80s and transient confusion and blurred vision.   On my exam, he is A&Ox3. Mini neuro exam normal. No focal deficits. Strength 5/5 on both sides.  CVP checked at bedside by RN and was 12. Lasix gtt discontinued and milrinone paused. Repeat BP 79/41 (MAP 51). Repeat manual pressure by RN 84/42.   His Co-ox this am was 44% despite milrinone @ 0.25. Remains in afib w/ SVR in the 50s. Also w/ anemia this admit, requiring coumadin w/ heparin bridge for mechanical aortic valve  + DAPT w/ ASA and Plavix for newly placed LM DES. Denies CP. Hgb 8.1 this am. Will check stat CBC.   3:38 PM: Pt monitored. Repeat BP 119/85 after lasix gtt discontinuation. Back on milrinone at lower dose, 0.125 mcg/kg/min. MAP 63.   3:41 PM: Repeat monitor pressure 131/83. MAP 96. Vision normal. No neurological abnormalties. Suspect transient blurred vision and confusion likely 2/2 hypotension. RN will monitor and call if any further issues.

## 2019-10-12 NOTE — Progress Notes (Addendum)
Patient ID: [name] [name], male   DOB: 02-Jun-1946, 73 y.o.   MRN: [id number]     Advanced Heart Failure Rounding Note  PCP-Cardiologist: Jenkins Rouge, MD   Subjective:    S/p successful orbital atherectomy, PTCA, and stenting of the left main with a 4.0x15 mm Resolute Onyx DES 11/3. LIMA-LAD patent. SVG-PDA patent. Denies CP.   Echo: EF 30-35%. Markedly volume overloaded yesterday w/ CVP of 20. Poor response to diuretics. Started on Lasix gtt, 12 mg/hr + metolazone. Started on milrinone, 0.25 mcg/kg/hr 11/4 to help push diuresis as well as for low Co-ox, which dropped to 41% yesterday.  Co-ox 45% today. Remains on Coreg.   Improved diuresis w/ milrinone and lasix gtt. 6.3L out yesterday. Net negative 10L total. Wt down 7 lb from 179>>172. CVP down to 11-12 today. Scr improved, down from 1.39>>1.20. Hyponatremia improving, Na 128>>132. MAP 63.   Remains in afib CVR in the 90s.   Feeling well this morning. Out of bed and in chair. Denies CP. No resting dyspnea. Asymptomatic w/ his afib.   Objective:   Weight Range: 78.1 kg Body mass index is 26.18 kg/m.   Vital Signs:   Temp:  [97.9 F (36.6 C)-98.2 F (36.8 C)] 98.1 F (36.7 C) (11/05 0400) Pulse Rate:  [55-87] 76 (11/05 0630) Resp:  [12-26] 19 (11/05 0630) BP: (70-161)/(19-143) 104/76 (11/05 0630) SpO2:  [85 %-100 %] 93 % (11/05 0630) Weight:  [78.1 kg] 78.1 kg (11/05 0600) Last BM Date: 10/11/19  Weight change: Filed Weights   10/10/19 0800 10/11/19 0439 10/12/19 0600  Weight: 79.9 kg 81.4 kg 78.1 kg    Intake/Output:   Intake/Output Summary (Last 24 hours) at 10/12/2019 0817 Last data filed at 10/12/2019 0700 Gross per 24 hour  Intake 1804.41 ml  Output 6375 ml  Net -4570.59 ml      Physical Exam    CVP 11-12  PHYSICAL EXAM: General:  Pleasant well appearing WM. No respiratory difficulty HEENT: normal Neck: supple. elevated JVD. Carotids 2+ bilat; no bruits. No lymphadenopathy or thyromegaly  appreciated. Cor: PMI nondisplaced. irregularly irregular rhythm, regular rate, crisp mechanical valve sounds. No rubs, gallops or murmurs. Lungs: clear Abdomen: soft, nontender, nondistended. No hepatosplenomegaly. No bruits or masses. Good bowel sounds. Extremities: no cyanosis, clubbing, rash, edema bilateral unna boots present  Neuro: alert & oriented x 3, cranial nerves grossly intact. moves all 4 extremities w/o difficulty. Affect pleasant.    Telemetry   Afib w/ CVR in the 70s   EKG    N/a   Labs    CBC Recent Labs    10/11/19 1340 10/12/19 0306  WBC 9.2 10.2  HGB 8.1* 8.1*  HCT 26.9* 27.0*  MCV 77.5* 76.9*  PLT 269 035   Basic Metabolic Panel Recent Labs    10/11/19 0441 10/11/19 1340 10/12/19 0306  NA 133* 128* 132*  K 3.8 3.7 3.5  CL 102 88* 91*  CO2 22 26 30   GLUCOSE 373* 421* 257*  BUN 25* 25* 23  CREATININE 1.09 1.39* 1.20  CALCIUM 7.5* 8.8* 8.8*  MG 1.7  --  1.7   Liver Function Tests Recent Labs    10/10/19 0528 10/11/19 0441  AST 119* 67*  ALT 471* 316*  ALKPHOS 73 73  BILITOT 1.1 0.8  PROT 5.7* 4.7*  ALBUMIN 3.0* 2.5*   No results for input(s): LIPASE, AMYLASE in the last 72 hours. Cardiac Enzymes No results for input(s): CKTOTAL, CKMB, CKMBINDEX, TROPONINI in the last 72 hours.  BNP: BNP (last 3 results) Recent Labs    09/16/19 1008 10/06/19 0958  BNP 676.4* 899.1*    ProBNP (last 3 results) Recent Labs    10/02/19 1131  PROBNP 3,116*     D-Dimer No results for input(s): DDIMER in the last 72 hours. Hemoglobin A1C No results for input(s): HGBA1C in the last 72 hours. Fasting Lipid Panel No results for input(s): CHOL, HDL, LDLCALC, TRIG, CHOLHDL, LDLDIRECT in the last 72 hours. Thyroid Function Tests No results for input(s): TSH, T4TOTAL, T3FREE, THYROIDAB in the last 72 hours.  Invalid input(s): FREET3  Other results:   Imaging    No results found.   Medications:     Scheduled Medications: .  aspirin EC  81 mg Oral Daily  . atorvastatin  80 mg Oral Daily  . carvedilol  3.125 mg Oral BID WC  . Chlorhexidine Gluconate Cloth  6 each Topical Daily  . clopidogrel  75 mg Oral Daily  . digoxin  0.125 mg Oral Daily  . insulin aspart  0-15 Units Subcutaneous TID WC  . insulin aspart  0-5 Units Subcutaneous QHS  . insulin glargine  18 Units Subcutaneous Daily  . isosorbide mononitrate  60 mg Oral Daily  . pantoprazole  40 mg Oral Daily  . sodium chloride flush  10-40 mL Intracatheter Q12H  . sodium chloride flush  3 mL Intravenous Once  . sodium chloride flush  3 mL Intravenous Q12H  . sodium chloride flush  3 mL Intravenous Q12H  . spironolactone  25 mg Oral Daily  . Warfarin - Pharmacist Dosing Inpatient   Does not apply q1800    Infusions: . sodium chloride 10 mL/hr at 10/12/19 0700  . amiodarone 30 mg/hr (10/12/19 0700)  . ferumoxytol 510 mg (10/09/19 0919)  . furosemide (LASIX) infusion 12 mg/hr (10/12/19 0700)  . heparin 1,300 Units/hr (10/12/19 0700)  . magnesium sulfate bolus IVPB    . milrinone 0.25 mcg/kg/min (10/12/19 0700)    PRN Medications: sodium chloride, acetaminophen, nitroGLYCERIN, ondansetron (ZOFRAN) IV, sodium chloride flush, sodium chloride flush    Assessment/Plan   1. Acute systolic CHF: Ischemic cardiomyopathy.  Echo this admission with EF 30-35% and wall motion abnormalities, prior echo earlier this month showed EF 50-55%.  Cath earlier this month showed jeopardized ramus and LCx territory (99% distal left main, occluded LAD, patent LIMA-LAD).  Concerned that ischemia in this territory has triggered CHF.  Echo showed lateral wall hypokinesis.  With elevated LFTs and creatinine at admission, there was initial concern for low output/cardiogenic shock physiology but initial co-ox was 67%.  Digoxin added 11/2. Co-ox dropped to 41% on 11/4. Started on 0.25 of milrinone 11/4 for low output and to help w/ diuresis. Placed on lasix gtt 12 mg/hr + metolazone.   -Improved diuresis w/ milrinone and lasix gtt. 6.3L out yesterday. Net negative 10L total. Wt down 7 lb from 179>>172. CVP down to 7 today. Scr improved, down from 1.39>>1.20.  - CVP 7 today. Continue lasix gtt, 12 mg/hr. - Continue spironolactone to 25 mg daily.  - Losartan held yesterday. Renal function improved. MAP in the 60s. Can d/c Imdur (s/p LM PCI w/o angina). Try adding back Losartan and monitor pressures.    - w/ continued low Co-ox despite milrinone, consider d/c coreg. Defer to MD - Continue digoxin 0.125 mg 2. CAD: Complicated disease, coronary angiography earlier this month showed patent SVG-RCA territory and patent LIMA-LAD.  The proximal LAD was occluded and there was 99% distal left main  stenosis.  This left the ramus and LCx in jeopardy.  He was not a good candidate for protected PCI => cannot place Impella with mechanical aortic valve and peripheral vascular disease likely precludes a femoral IABP.  It was decided to try to manage him medically.  However, he returned with progressive chest pain episodes and NSTEMI, hs-TnI up to 935.  S/p successful orbital atherectomy, PTCA, and stenting of the left main with a 4.0x15 mm Resolute Onyx DES 11/3. Stable w/o CP -Plan triple therapy w/ ASA, Plavix + Coumadin x 30 days. Stop ASA after 1 month. Plavix for at least 6 months if tolerated.  -PPI for GI protection, Protonix 40  -Continue atorvastatin 80 daily. LDL goal <70.  -Can reduce dose/ ? Complete discontinuation of Imdur now post PCI  3. Mechanical aortic valve: Stable on echo this admission. Crisp mechanical sounds on exam - Continue ASA 81.  - INR 2.0 today. Bridging w/ IV heparin. INR goal 2.5-3.5  4. NSVT:amiodarone gtt started. Improved. This was likely due to ischemia. Had sinus pauses x 2 during sleep 11/2, up to 3.2 sec. No recurrence - Continue amiodarone gtt  - Replete K and Mg as needed to keep K >4.0 and Mg >2.0.  5. H/o GI bleeding: no overt bleeding (normal-appearing  BM since admit).  INR 8 initially, now improved after dose of vit k. INR 2.0. Hgb stable today at 8.1. Requiring triple therapy x 30 days for newly placed LM DES + Afib and mechanical AV.  - Monitor H/H closely.  -Continue PPI for GI protection. -Iron low at 8. Ferritin 27. Feraheme started.  6. PAD: Patient with aorto-bifemoral bypass, h/o iliac stenting, h/o femoral endarterectomy, PCI to right SFA in 2017.  7. H/o renal artery stenosis.  8. AKI: SCr improving w/ diuresis, 1.39>>1.20.  Follow closely.   9. Elevated LFTs: Shock liver versus congestive hepatopathy.  LFTs have been trending down. Do not think that amiodarone is the cause (elevated LFTs prior to amiodarone).  - Continue IV Lasix.  10. DM2: CBGs in the 200s. Cover with sliding scale + Lantis. Will add meal time coverage.  Carb modified diet 11. Atrial fibrillation: Atrial fibrillation is new. CVR in the 70s. Continue heparin gtt + amiodarone.  - -If he does not come out of atrial fibrillation on his own may need DCCV eventually.   Length of Stay: [address], [place]  10/12/2019, 8:17 AM  Advanced Heart Failure Team Pager [phone] (M-F; 7a - 4p)  Please contact [facility] for night-coverage after hours (4p -7a ) and weekends on [email or web address]  Patient seen with PA, agree with the above note.   He diuresed much better yesterday after starting milrinone, weight down and CVP down from 20 => 11-12 this morning.  Co-ox surprisingly remains low at 45%.  He feels much better, minimal dyspnea.  He remains in atrial fibrillation.  INR 2 on IV heparin gtt with mechanical aortic valve and atrial fibrillation.  Creatinine down to 1.2.   General: NAD Neck: JVP 11-12 cm, no thyromegaly or thyroid nodule.  Lungs: Clear to auscultation bilaterally with normal respiratory effort. CV: Nondisplaced PMI.  Heart irregular S1/S2 with mechanical S2, no S3/S4, 1/6 SEM RUSB.  1+ edema to knees.   Abdomen: Soft, nontender, no  hepatosplenomegaly, no distention.  Skin: Intact without lesions or rashes.  Neurologic: Alert and oriented x 3.  Psych: Normal affect. Extremities: No clubbing or cyanosis.  HEENT: Normal.   Diuresis much improved on  Lasix gtt + milrinone 0.25. Creatinine lower at 1.2.  CVP 11-12 today, down from 20.  Still some volume overload on exam.  Co-ox surprisingly still low at 45% though he is responding well to therapy and feels better.  - Would continue Lasix at 12 mg/hr today, think we can hold off on metolazone. Probably can transition to po tomorrow.  - Continue milrinone 0.25 for now while diuresing, will try to stop prior to DCCV.  - Continue digoxin, low dose carvedilol and spironolactone.  - Can stop Imdur and will start losartan 12.5 daily.   Post-PCI plan as above.   Continue heparin gtt with mechanical aortic valve and atrial fibrillation until INR closer to 2.5.  As he will be on triple therapy for a month and Plavix/warfarin after that, would aim for INR 2.5 - 3 until he completes his time on Plavix.   He remains in atrial fibrillation.  Will aim for TEE-guided DCCV tomorrow.  Continue amiodarone gtt.   Hgb stable, no overt bleeding, has had feraheme, continue PPI.   CRITICAL CARE Performed by: Loralie Champagne  Total critical care time: 35 minutes  Critical care time was exclusive of separately billable procedures and treating other patients.  Critical care was necessary to treat or prevent imminent or life-threatening deterioration.  Critical care was time spent personally by me on the following activities: development of treatment plan with patient and/or surrogate as well as nursing, discussions with consultants, evaluation of patient's response to treatment, examination of patient, obtaining history from patient or surrogate, ordering and performing treatments and interventions, ordering and review of laboratory studies, ordering and review of radiographic studies, pulse  oximetry and re-evaluation of patient's condition.  Loralie Champagne 10/12/2019 9:43 AM

## 2019-10-13 ENCOUNTER — Other Ambulatory Visit (HOSPITAL_COMMUNITY): Payer: Medicare Other

## 2019-10-13 ENCOUNTER — Ambulatory Visit (HOSPITAL_COMMUNITY): Admit: 2019-10-13 | Payer: Medicare Other | Admitting: Cardiology

## 2019-10-13 DIAGNOSIS — I4819 Other persistent atrial fibrillation: Secondary | ICD-10-CM

## 2019-10-13 LAB — CBC
HCT: 28.2 % — ABNORMAL LOW (ref 39.0–52.0)
Hemoglobin: 8.6 g/dL — ABNORMAL LOW (ref 13.0–17.0)
MCH: 23.8 pg — ABNORMAL LOW (ref 26.0–34.0)
MCHC: 30.5 g/dL (ref 30.0–36.0)
MCV: 78.1 fL — ABNORMAL LOW (ref 80.0–100.0)
Platelets: 244 10*3/uL (ref 150–400)
RBC: 3.61 MIL/uL — ABNORMAL LOW (ref 4.22–5.81)
RDW: 27 % — ABNORMAL HIGH (ref 11.5–15.5)
WBC: 8 10*3/uL (ref 4.0–10.5)
nRBC: 26.5 % — ABNORMAL HIGH (ref 0.0–0.2)

## 2019-10-13 LAB — BASIC METABOLIC PANEL
Anion gap: 10 (ref 5–15)
Anion gap: 10 (ref 5–15)
Anion gap: 10 (ref 5–15)
BUN: 23 mg/dL (ref 8–23)
BUN: 24 mg/dL — ABNORMAL HIGH (ref 8–23)
BUN: 26 mg/dL — ABNORMAL HIGH (ref 8–23)
CO2: 30 mmol/L (ref 22–32)
CO2: 24 mmol/L (ref 22–32)
CO2: 28 mmol/L (ref 22–32)
Calcium: 9.2 mg/dL (ref 8.9–10.3)
Calcium: 7.8 mg/dL — ABNORMAL LOW (ref 8.9–10.3)
Calcium: 9.2 mg/dL (ref 8.9–10.3)
Chloride: 92 mmol/L — ABNORMAL LOW (ref 98–111)
Chloride: 94 mmol/L — ABNORMAL LOW (ref 98–111)
Chloride: 90 mmol/L — ABNORMAL LOW (ref 98–111)
Creatinine, Ser: 1.34 mg/dL — ABNORMAL HIGH (ref 0.61–1.24)
Creatinine, Ser: 1.57 mg/dL — ABNORMAL HIGH (ref 0.61–1.24)
Creatinine, Ser: 1.91 mg/dL — ABNORMAL HIGH (ref 0.61–1.24)
GFR calc Af Amer: >60 mL/min (ref >60)
GFR calc Af Amer: 50 mL/min — ABNORMAL LOW (ref >60)
GFR calc Af Amer: 39 mL/min — ABNORMAL LOW (ref >60)
GFR calc non Af Amer: 52 mL/min — ABNORMAL LOW (ref >60)
GFR calc non Af Amer: 43 mL/min — ABNORMAL LOW (ref >60)
GFR calc non Af Amer: 34 mL/min — ABNORMAL LOW (ref >60)
Glucose, Bld: 170 mg/dL — ABNORMAL HIGH (ref 70–99)
Glucose, Bld: 422 mg/dL — ABNORMAL HIGH (ref 70–99)
Glucose, Bld: 352 mg/dL — ABNORMAL HIGH (ref 70–99)
Potassium: 4.5 mmol/L (ref 3.5–5.1)
Potassium: 5.2 mmol/L — ABNORMAL HIGH (ref 3.5–5.1)
Potassium: 5.6 mmol/L — ABNORMAL HIGH (ref 3.5–5.1)
Sodium: 132 mmol/L — ABNORMAL LOW (ref 135–145)
Sodium: 128 mmol/L — ABNORMAL LOW (ref 135–145)
Sodium: 128 mmol/L — ABNORMAL LOW (ref 135–145)

## 2019-10-13 LAB — MAGNESIUM: Magnesium: 2.1 mg/dL (ref 1.7–2.4)

## 2019-10-13 LAB — COOXEMETRY PANEL
Carboxyhemoglobin: 2 % — ABNORMAL HIGH (ref 0.5–1.5)
Carboxyhemoglobin: 1.4 % (ref 0.5–1.5)
Methemoglobin: 1.8 % — ABNORMAL HIGH (ref 0.0–1.5)
Methemoglobin: 1.3 % (ref 0.0–1.5)
O2 Saturation: 59.6 %
O2 Saturation: 45.5 %
Total hemoglobin: 8.8 g/dL — ABNORMAL LOW (ref 12.0–16.0)
Total hemoglobin: 9.1 g/dL — ABNORMAL LOW (ref 12.0–16.0)

## 2019-10-13 LAB — GLUCOSE, CAPILLARY
Glucose-Capillary: 170 mg/dL — ABNORMAL HIGH (ref 70–99)
Glucose-Capillary: 174 mg/dL — ABNORMAL HIGH (ref 70–99)
Glucose-Capillary: 180 mg/dL — ABNORMAL HIGH (ref 70–99)
Glucose-Capillary: 208 mg/dL — ABNORMAL HIGH (ref 70–99)
Glucose-Capillary: 299 mg/dL — ABNORMAL HIGH (ref 70–99)
Glucose-Capillary: 300 mg/dL — ABNORMAL HIGH (ref 70–99)
Glucose-Capillary: 362 mg/dL — ABNORMAL HIGH (ref 70–99)

## 2019-10-13 LAB — PROTIME-INR
INR: 2.5 — ABNORMAL HIGH (ref 0.8–1.2)
Prothrombin Time: 26.6 seconds — ABNORMAL HIGH (ref 11.4–15.2)

## 2019-10-13 LAB — DIGOXIN LEVEL: Digoxin Level: 0.3 ng/mL — ABNORMAL LOW (ref 0.8–2.0)

## 2019-10-13 MED ORDER — METOLAZONE 2.5 MG PO TABS
2.5000 mg | ORAL_TABLET | Freq: Once | ORAL | Status: AC
  Administered 2019-10-13: 2.5 mg via ORAL
  Filled 2019-10-13: qty 1

## 2019-10-13 MED ORDER — FUROSEMIDE 10 MG/ML IJ SOLN
12.0000 mg/h | INTRAVENOUS | Status: DC
  Administered 2019-10-13 – 2019-10-15 (×3): 12 mg/h via INTRAVENOUS
  Filled 2019-10-13 (×3): qty 25

## 2019-10-13 MED ORDER — SODIUM CHLORIDE 0.9 % IV SOLN: 250.0000 mL | INTRAVENOUS | Status: DC

## 2019-10-13 MED ORDER — SODIUM ZIRCONIUM CYCLOSILICATE 5 G PO PACK
5.0000 g | PACK | Freq: Once | ORAL | Status: AC
  Administered 2019-10-13: 5 g via ORAL
  Filled 2019-10-13 (×2): qty 1

## 2019-10-13 MED ORDER — WARFARIN SODIUM 5 MG PO TABS
5.0000 mg | ORAL_TABLET | Freq: Once | ORAL | Status: AC
  Administered 2019-10-13: 5 mg via ORAL
  Filled 2019-10-13: qty 1

## 2019-10-13 MED ORDER — SODIUM CHLORIDE 0.9% FLUSH: 3.0000 mL | INTRAVENOUS | Status: DC | PRN

## 2019-10-13 MED ORDER — SODIUM CHLORIDE 0.9% FLUSH
3.0000 mL | Freq: Two times a day (BID) | INTRAVENOUS | Status: DC
  Administered 2019-10-13 – 2019-10-14 (×3): 3 mL via INTRAVENOUS

## 2019-10-13 MED ORDER — SODIUM CHLORIDE 0.9 % IV SOLN
INTRAVENOUS | Status: DC
  Administered 2019-10-13: 20 mL/h via INTRAVENOUS

## 2019-10-13 NOTE — Progress Notes (Signed)
Patient ID: [name] [name], male   DOB: [birth year], 73 y.o.   MRN: [id number]     Advanced Heart Failure Rounding Note  PCP-Cardiologist: Jenkins Rouge, MD   Subjective:    S/p successful orbital atherectomy, PTCA, and stenting of the left main with a 4.0x15 mm Resolute Onyx DES 11/3. LIMA-LAD patent. SVG-PDA patent. Denies CP.   Echo: EF 30-35%, mechanical AoV ok.   Markedly volume initially w/ CVP of 20. Poor response to diuretics. Started on Lasix gtt, 12 mg/hr + metolazone. Milrinone begun with low co-ox 44%.  Yesterday, Lasix gtt was stopped and milrinone was decreased to 0.125 with transient hypotension in the evening.  Today, BP stable with co-ox 60% on milrinone 0.125.  CVP remains high at 16-18.    He has been in atrial fibrillation with controlled rate, which appears to be a new diagnosis this admission. He is on amiodarone gtt.   He has been walking in the hall, breathing is getting better, no chest pain.   Objective:   Weight Range: 79.1 kg Body mass index is 26.51 kg/m.   Vital Signs:   Temp:  [97.7 F (36.5 C)-98.5 F (36.9 C)] 98.1 F (36.7 C) (11/06 0827) Pulse Rate:  [25-79] 64 (11/06 0800) Resp:  [12-25] 19 (11/06 0800) BP: (83-154)/(18-118) 128/43 (11/06 0800) SpO2:  [81 %-99 %] 96 % (11/06 0800) Weight:  [79.1 kg] 79.1 kg (11/06 0500) Last BM Date: 10/12/19  Weight change: Filed Weights   10/11/19 0439 10/12/19 0600 10/13/19 0500  Weight: 81.4 kg 78.1 kg 79.1 kg    Intake/Output:   Intake/Output Summary (Last 24 hours) at 10/13/2019 0858 Last data filed at 10/13/2019 0800 Gross per 24 hour  Intake 1684.97 ml  Output 3200 ml  Net -1515.03 ml      Physical Exam    CVP 16-18 General: NAD Neck: JVP 14+, no thyromegaly or thyroid nodule.  Lungs: Clear to auscultation bilaterally with normal respiratory effort. CV: Nondisplaced PMI.  Heart irregular S1/S2 with mechanical S2, no S3/S4, 2/6 SEM RUSB. 1+ edema to knees with unna boots.     Abdomen: Soft, nontender, no hepatosplenomegaly, no distention.  Skin: Intact without lesions or rashes.  Neurologic: Alert and oriented x 3.  Psych: Normal affect. Extremities: No clubbing or cyanosis.  HEENT: Normal.    Telemetry   Afib w/ CVR in the 70s (personally reviewed)  EKG    N/a   Labs    CBC Recent Labs    10/12/19 1535 10/13/19 0500  WBC 10.2 8.0  HGB 8.3* 8.6*  HCT 27.5* 28.2*  MCV 78.6* 78.1*  PLT 277 324   Basic Metabolic Panel Recent Labs    10/12/19 0306 10/13/19 0500  NA 132* 132*  K 3.5 4.5  CL 91* 92*  CO2 30 30  GLUCOSE 257* 170*  BUN 23 23  CREATININE 1.20 1.34*  CALCIUM 8.8* 9.2  MG 1.7 2.1   Liver Function Tests Recent Labs    10/11/19 0441  AST 67*  ALT 316*  ALKPHOS 73  BILITOT 0.8  PROT 4.7*  ALBUMIN 2.5*   No results for input(s): LIPASE, AMYLASE in the last 72 hours. Cardiac Enzymes No results for input(s): CKTOTAL, CKMB, CKMBINDEX, TROPONINI in the last 72 hours.  BNP: BNP (last 3 results) Recent Labs    09/16/19 1008 10/06/19 0958  BNP 676.4* 899.1*    ProBNP (last 3 results) Recent Labs    10/02/19 1131  PROBNP 3,116*     D-Dimer  No results for input(s): DDIMER in the last 72 hours. Hemoglobin A1C No results for input(s): HGBA1C in the last 72 hours. Fasting Lipid Panel No results for input(s): CHOL, HDL, LDLCALC, TRIG, CHOLHDL, LDLDIRECT in the last 72 hours. Thyroid Function Tests No results for input(s): TSH, T4TOTAL, T3FREE, THYROIDAB in the last 72 hours.  Invalid input(s): FREET3  Other results:   Imaging    No results found.   Medications:     Scheduled Medications:  aspirin EC  81 mg Oral Daily   atorvastatin  80 mg Oral Daily   carvedilol  3.125 mg Oral Q12H   Chlorhexidine Gluconate Cloth  6 each Topical Daily   clopidogrel  75 mg Oral Daily   digoxin  0.125 mg Oral Daily   insulin aspart  0-15 Units Subcutaneous TID WC   insulin aspart  0-5 Units  Subcutaneous QHS   insulin aspart  4 Units Subcutaneous TID WC   insulin glargine  18 Units Subcutaneous Daily   losartan  12.5 mg Oral Daily   metolazone  2.5 mg Oral Once   pantoprazole  40 mg Oral Daily   sodium chloride flush  10-40 mL Intracatheter Q12H   sodium chloride flush  3 mL Intravenous Once   sodium chloride flush  3 mL Intravenous Q12H   sodium chloride flush  3 mL Intravenous Q12H   sodium chloride flush  3 mL Intravenous Q12H   spironolactone  25 mg Oral Daily   Warfarin - Pharmacist Dosing Inpatient   Does not apply q1800    Infusions:  sodium chloride Stopped (10/13/19 0128)   sodium chloride 20 mL/hr (10/13/19 0710)   sodium chloride     amiodarone 30 mg/hr (10/13/19 0542)   furosemide (LASIX) infusion     milrinone 0.125 mcg/kg/min (10/13/19 0500)    PRN Medications: sodium chloride, acetaminophen, nitroGLYCERIN, ondansetron (ZOFRAN) IV, sodium chloride flush, sodium chloride flush, sodium chloride flush    Assessment/Plan   1. Acute systolic CHF: Ischemic cardiomyopathy.  Echo this admission with EF 30-35% and wall motion abnormalities, prior echo earlier this month showed EF 50-55%.  Cath 10/20 showed jeopardized ramus and LCx territory (99% distal left main, occluded LAD, patent LIMA-LAD).  Concerned that ischemia in this territory has triggered CHF.  Echo showed lateral wall hypokinesis.  With elevated LFTs and creatinine at admission, there was initial concern for low output/cardiogenic shock physiology but initial co-ox was 67%.  Digoxin added 11/2. Co-ox dropped to 41% on 11/4. Started on 0.25 of milrinone 11/4 for low output and to help w/ diuresis. Placed on lasix gtt 12 mg/hr + metolazone. This was stopped yesterday afternoon with transient hypotension.  Today, CVP remains 16-18 with co-ox 60% on milrinone 0.125 and off Lasix gtt.  He is volume overloaded on exam. Creatinine fairly stable at 1.34.  - I will increase milrinone to 0.25  and restart Lasix 12 mg/hr with metolazone 2.5 x 1 today. He needs more diuresis.  - Continue spironolactone to 25 mg daily.  - Continue losartan 12.5 mg daily.  With hypotensive episode yesterday, will not increase or transition to [facility].  - Can continue Coreg 3.125 mg bid for now.  - Continue digoxin 0.125 mg 2. CAD: Complicated disease, coronary angiography 10/20 showed patent SVG-RCA territory and patent LIMA-LAD.  The proximal LAD was occluded and there was 99% distal left main stenosis.  This left the ramus and LCx in jeopardy.  He was not a good candidate for protected PCI => cannot  place Impella with mechanical aortic valve and peripheral vascular disease likely precludes a femoral IABP.  It was decided to try to manage him medically.  However, he returned with progressive chest pain episodes and NSTEMI, hs-TnI up to 935.  S/p successful orbital atherectomy, PTCA, and stenting of the left main with a 4.0x15 mm Resolute Onyx DES 11/3. Stable w/o CP - Plan triple therapy w/ ASA, Plavix + Coumadin x 30 days. Stop ASA after 1 month. Plavix for at least 6 months if tolerated.  - PPI for GI protection, Protonix 40  - Continue atorvastatin 80 daily. LDL goal <70.   3. Mechanical aortic valve: Stable on echo this admission. Crisp mechanical sounds on exam - Continue ASA 81.  - INR 2.5 today, stop heparin gtt.  Continue warfarin and will aim for INR 2.5-3.  4. NSVT:amiodarone gtt started. Improved. This was likely due to ischemia. Had sinus pauses x 2 during sleep 11/2, up to 3.2 sec. No recurrence.  - Continue amiodarone gtt  - Replete K and Mg as needed to keep K >4.0 and Mg >2.0.  5. H/o GI bleeding: no overt bleeding (normal-appearing BM since admit).  INR 8 initially, now improved after dose of vit k. INR 2.0. Hgb stable today at 8.6. Requiring triple therapy x 30 days for newly placed LM DES + Afib and mechanical AV. He has had feraheme.  No overt bleeding.  - Continue PPI for GI  protection. 6. PAD: Patient with aorto-bifemoral bypass, h/o iliac stenting, h/o femoral endarterectomy, PCI to right SFA in 2017.  7. H/o renal artery stenosis.  8. AKI: Resolved.  Follow creatinine closely.   9. Elevated LFTs: Shock liver versus congestive hepatopathy.  LFTs have been trending down. Do not think that amiodarone is the cause (elevated LFTs prior to amiodarone).  - Continue IV Lasix.  10. DM2: CBGs in the 200s. Cover with sliding scale + Lantis. Will add meal time coverage.  Carb modified diet 11. Atrial fibrillation: Atrial fibrillation is new. Rate controlled in 70s. INR therapeutic on warfarin.  Had planned TEE-guided DCCV today but still very volume overloaded and on milrinone, so will defer to Monday.  - TEE-DCCV scheduled Monday at 12.  - Hopefully can wean off milrinone by DCCV.   CRITICAL CARE Performed by: Loralie Champagne  Total critical care time: 35 minutes  Critical care time was exclusive of separately billable procedures and treating other patients.  Critical care was necessary to treat or prevent imminent or life-threatening deterioration.  Critical care was time spent personally by me on the following activities: development of treatment plan with patient and/or surrogate as well as nursing, discussions with consultants, evaluation of patient's response to treatment, examination of patient, obtaining history from patient or surrogate, ordering and performing treatments and interventions, ordering and review of laboratory studies, ordering and review of radiographic studies, pulse oximetry and re-evaluation of patient's condition.  Loralie Champagne 10/13/2019 8:58 AM

## 2019-10-13 NOTE — Progress Notes (Signed)
Lab work redrawn due to results. Tolerated well. Currently in process. Patient alert and oriented X 4, sitting in chair, family at bedside, denies pain call bell within reach. Will continue to monitor.

## 2019-10-13 NOTE — Progress Notes (Addendum)
   Called nursing staff regarding abnormal lab work.   Hypotensive this afternoon  K 5.6 Creatinine up to 1.9  CVP 17-18  500 cc urine output.  On lasix drip at 12 mg per hour + milrinone 0.25 mcg.    Check CO-OX now.  Give dose of lokelma for hyperkalemia . Hold losartan and spiro. Stop carvedilol.   Chasten Blaze NP-C  4:45 PM

## 2019-10-13 NOTE — Progress Notes (Signed)
Called Dr. Aundra Dubin about afternoon lab work. Stated he will place new orders. Awaiting new orders. Patient remains alert and oriented x 4. Family at bedside. Will continue to monitor.

## 2019-10-13 NOTE — Plan of Care (Signed)
  Problem: Education: Goal: Knowledge of General Education information will improve Description: Including pain rating scale, medication(s)/side effects and non-pharmacologic comfort measures Outcome: Progressing   Problem: Health Behavior/Discharge Planning: Goal: Ability to manage health-related needs will improve Outcome: Progressing   Problem: Clinical Measurements: Goal: Ability to maintain clinical measurements within normal limits will improve Outcome: Progressing Goal: Will remain free from infection Outcome: Progressing Goal: Diagnostic test results will improve Outcome: Progressing Goal: Respiratory complications will improve Outcome: Progressing Goal: Cardiovascular complication will be avoided Outcome: Progressing   Problem: Activity: Goal: Risk for activity intolerance will decrease Outcome: Progressing   Problem: Nutrition: Goal: Adequate nutrition will be maintained Outcome: Progressing   Problem: Coping: Goal: Level of anxiety will decrease Outcome: Progressing   Problem: Elimination: Goal: Will not experience complications related to bowel motility Outcome: Progressing Goal: Will not experience complications related to urinary retention Outcome: Progressing   Problem: Pain Managment: Goal: General experience of comfort will improve Outcome: Progressing   Problem: Safety: Goal: Ability to remain free from injury will improve Outcome: Progressing   Problem: Education: Goal: Ability to demonstrate management of disease process will improve Outcome: Progressing Goal: Ability to verbalize understanding of medication therapies will improve Outcome: Progressing Goal: Individualized Educational Video(s) Outcome: Progressing   Problem: Skin Integrity: Goal: Risk for impaired skin integrity will decrease Outcome: Progressing   Problem: Activity: Goal: Capacity to carry out activities will improve Outcome: Progressing   Problem: Cardiac: Goal:  Ability to achieve and maintain adequate cardiopulmonary perfusion will improve Outcome: Progressing   Problem: Education: Goal: Understanding of CV disease, CV risk reduction, and recovery process will improve Outcome: Progressing Goal: Individualized Educational Video(s) Outcome: Progressing   Problem: Activity: Goal: Ability to return to baseline activity level will improve Outcome: Progressing   Problem: Cardiovascular: Goal: Ability to achieve and maintain adequate cardiovascular perfusion will improve Outcome: Progressing Goal: Vascular access site(s) Level 0-1 will be maintained Outcome: Progressing   Problem: Health Behavior/Discharge Planning: Goal: Ability to safely manage health-related needs after discharge will improve Outcome: Progressing

## 2019-10-13 NOTE — Progress Notes (Signed)
Blood pressure was reduced. Stopped Lasix drip and Dr. Aundra Dubin rounding on patient. Informed MD. Md stated low pressure was most likely from AM medications and lasix now infusing. Stated to only stop lasix infusion if SBP was less than 85. Stated that he would discontinue AM medications to prevent this from happening again. Stated that patient needed to reduce fluid and will do this with Lasix infusion. Patient remained alert and oriented x 4 during decreased blood pressure. Denies all pain. Call bell within reach, family at bedside, patient sitting in the chair. Will continue to monitor.

## 2019-10-13 NOTE — Progress Notes (Signed)
Called HR team about Co-Ox results. Stated we will continue to follow and to repeat BMP. Order already in to repeat. Patient is alert and oriented x 4 will continue to monitor.

## 2019-10-13 NOTE — Progress Notes (Signed)
CARDIAC REHAB PHASE I   PRE:  Rate/Rhythm: 67 afib    BP: sitting 85/64, recheck 101/45    SaO2: 96 RA  MODE:  Ambulation: 330 ft   POST:  Rate/Rhythm: 67 afib    BP: sitting 107/89     SaO2: 94 RA  Pt not feeling as well today. SOB. Willing to walk but decided to go shorter than yesterday. Tolerated fairly well until 200 ft c/o weak legs. He began to be wobbly, legs crossing. Sat to rest. BP 92/38 once it took. Pt rested and then able to walk back to room. C/o SOB but no blurred vision. To recliner, lunch arrived. Encouraged walking with staff.  [id number]   Belt, ACSM 10/13/2019 11:51 AM

## 2019-10-13 NOTE — Progress Notes (Signed)
ANTICOAGULATION CONSULT NOTE  Pharmacy Consult for Warfarin Indication: mech AVR and new AFib  No Known Allergies  Patient Measurements: Height: 5\' 8"  (172.7 cm) Weight: 174 lb 6.1 oz (79.1 kg) IBW/kg (Calculated) : 68.4 Heparin dosing weight: 79 kg  Vital Signs: Temp: 98.1 F (36.7 C) (11/06 0827) Temp Source: Oral (11/06 0827) BP: 128/43 (11/06 0800) Pulse Rate: 64 (11/06 0800)  Labs: Recent Labs    10/11/19 0441  10/11/19 1340 10/11/19 1658 10/11/19 2303 10/12/19 0306 10/12/19 0955 10/12/19 1535 10/13/19 0500  HGB 7.8*  --  8.1*  --   --  8.1*  --  8.3* 8.6*  HCT 26.3*  --  26.9*  --   --  27.0*  --  27.5* 28.2*  PLT 270  --  269  --   --  273  --  277 244  LABPROT 18.9*  --   --   --   --  22.7*  --   --  26.6*  INR 1.6*  --   --   --   --  2.0*  --   --  2.5*  HEPARINUNFRC  --    < > >2.20* 0.63 0.71*  --  0.63  --   --   CREATININE 1.09  --  1.39*  --   --  1.20  --   --  1.34*   < > = values in this interval not displayed.    Estimated Creatinine Clearance: 46.8 mL/min (A) (by C-G formula based on SCr of 1.34 mg/dL (H)).   Assessment: Patient is a 73 year old male who presented with shortness of breath and chest pain. Recent cath showed occluded proxLAD and 99% distal left main stenosis. Underwent PCI on 11/3 with placement of DES to LM and placed on triple therapy.  Patient is on warfarin PTA for mechanical AVR (INR goal 2.5-3.5). INR on admit was 8.0 and was given vitamin K. Dose: 5 mg daily except 10 mg on Su, Tu, Th  INR therapeutic at 2.5 after three doses of warfarin. CBC stable with no overt bleeding or infusion issues noted. Has a higher INR goal given mechanical AVR with new onset AF, but given that he is on triple therapy, will target the lower end of higher goal.  Goal of Therapy:  INR 2.5-3.0 Monitor platelets by anticoagulation protocol: Yes   Plan:  Stop heparin  Warfarin 5 mg PO x1 tonight Daily INR, CBC Monitor for bleeding Planning  DCCV for 11/9  Richardine Service, PharmD PGY1 Pharmacy Resident Phone: [phone] 10/13/2019  9:17 AM  Please check [email or web address] for unit-specific pharmacy phone numbers.

## 2019-10-14 DIAGNOSIS — I480 Paroxysmal atrial fibrillation: Secondary | ICD-10-CM

## 2019-10-14 LAB — CBC
HCT: 30.9 % — ABNORMAL LOW (ref 39.0–52.0)
Hemoglobin: 9.4 g/dL — ABNORMAL LOW (ref 13.0–17.0)
MCH: 23.7 pg — ABNORMAL LOW (ref 26.0–34.0)
MCHC: 30.4 g/dL (ref 30.0–36.0)
MCV: 78 fL — ABNORMAL LOW (ref 80.0–100.0)
Platelets: 261 10*3/uL (ref 150–400)
RBC: 3.96 MIL/uL — ABNORMAL LOW (ref 4.22–5.81)
RDW: 28 % — ABNORMAL HIGH (ref 11.5–15.5)
WBC: 7.9 10*3/uL (ref 4.0–10.5)
nRBC: 10.7 % — ABNORMAL HIGH (ref 0.0–0.2)

## 2019-10-14 LAB — GLUCOSE, CAPILLARY
Glucose-Capillary: 124 mg/dL — ABNORMAL HIGH (ref 70–99)
Glucose-Capillary: 137 mg/dL — ABNORMAL HIGH (ref 70–99)
Glucose-Capillary: 328 mg/dL — ABNORMAL HIGH (ref 70–99)
Glucose-Capillary: 187 mg/dL — ABNORMAL HIGH (ref 70–99)
Glucose-Capillary: 220 mg/dL — ABNORMAL HIGH (ref 70–99)

## 2019-10-14 LAB — COOXEMETRY PANEL
Carboxyhemoglobin: 1.7 % — ABNORMAL HIGH (ref 0.5–1.5)
Methemoglobin: 1.1 % (ref 0.0–1.5)
O2 Saturation: 62.1 %
Total hemoglobin: 9.2 g/dL — ABNORMAL LOW (ref 12.0–16.0)

## 2019-10-14 LAB — BASIC METABOLIC PANEL
Anion gap: 13 (ref 5–15)
BUN: 24 mg/dL — ABNORMAL HIGH (ref 8–23)
CO2: 31 mmol/L (ref 22–32)
Calcium: 9.4 mg/dL (ref 8.9–10.3)
Chloride: 88 mmol/L — ABNORMAL LOW (ref 98–111)
Creatinine, Ser: 1.42 mg/dL — ABNORMAL HIGH (ref 0.61–1.24)
GFR calc Af Amer: 56 mL/min — ABNORMAL LOW (ref >60)
GFR calc non Af Amer: 48 mL/min — ABNORMAL LOW (ref >60)
Glucose, Bld: 128 mg/dL — ABNORMAL HIGH (ref 70–99)
Potassium: 3.7 mmol/L (ref 3.5–5.1)
Sodium: 132 mmol/L — ABNORMAL LOW (ref 135–145)

## 2019-10-14 LAB — PROTIME-INR
INR: 2.7 — ABNORMAL HIGH (ref 0.8–1.2)
Prothrombin Time: 28.4 seconds — ABNORMAL HIGH (ref 11.4–15.2)

## 2019-10-14 MED ORDER — WARFARIN SODIUM 5 MG PO TABS
5.0000 mg | ORAL_TABLET | Freq: Once | ORAL | Status: AC
  Administered 2019-10-14: 5 mg via ORAL
  Filled 2019-10-14: qty 1

## 2019-10-14 NOTE — Progress Notes (Signed)
[facility] for Warfarin Indication: mech AVR and new AFib  No Known Allergies  Patient Measurements: Height: 5\' 8"  (172.7 cm) Weight: 171 lb 15.3 oz (78 kg) IBW/kg (Calculated) : 68.4 Heparin dosing weight: 79 kg  Vital Signs: Temp: 99 F (37.2 C) (11/07 1203) Temp Source: Oral (11/07 1203) BP: 110/100 (11/07 1000) Pulse Rate: 68 (11/07 1000)  Labs: Recent Labs    10/11/19 1658 10/11/19 2303 10/12/19 0306 10/12/19 0955 10/12/19 1535 10/13/19 0500 10/13/19 1309 10/13/19 1522 10/14/19 0442  HGB  --   --  8.1*  --  8.3* 8.6*  --   --  9.4*  HCT  --   --  27.0*  --  27.5* 28.2*  --   --  30.9*  PLT  --   --  273  --  277 244  --   --  261  LABPROT  --   --  22.7*  --   --  26.6*  --   --  28.4*  INR  --   --  2.0*  --   --  2.5*  --   --  2.7*  HEPARINUNFRC 0.63 0.71*  --  0.63  --   --   --   --   --   CREATININE  --   --  1.20  --   --  1.34* 1.57* 1.91* 1.42*    Estimated Creatinine Clearance: 44.2 mL/min (A) (by C-G formula based on SCr of 1.42 mg/dL (H)).   Assessment: Patient is a 73 year old male who presented with shortness of breath and chest pain. Recent cath showed occluded proxLAD and 99% distal left main stenosis. Underwent PCI on 11/3 with placement of DES to LM and placed on triple therapy.  Patient is on warfarin PTA for mechanical AVR (INR goal 2.5-3.5). INR on admit was 8.0 and was given vitamin K > !NR down to 1.5 PTA warfarin Dose: 5 mg daily except 10 mg on Su, Tu, Th  INR therapeutic at 2.7 after restart warfarin. CBC stable with no overt bleeding. Has a higher INR goal given mechanical AVR with new onset AF, but given that he is on triple therapy post DES, will target the lower end of higher goal. New Afib new amiodarone hich can interact with warfarin - assume he will require power than PTA doses   Goal of Therapy:  INR 2.5-3.0 Monitor platelets by anticoagulation protocol: Yes   Plan:  Warfarin 5 mg PO  x1 tonight repeat Daily INR, CBC Monitor for bleeding Planning DCCV for 11/9  Bonnita Nasuti Pharm.D. CPP, BCPS Clinical Pharmacist [phone] 10/14/2019 12:26 PM     Please check [email or web address] for unit-specific pharmacy phone numbers.

## 2019-10-14 NOTE — Progress Notes (Addendum)
Patient ID: [name] [name], male   DOB: [birth year], 73 y.o.   MRN: [id number]     Advanced Heart Failure Rounding Note  PCP-Cardiologist: Jenkins Rouge, MD   Subjective:    S/p successful orbital atherectomy, PTCA, and stenting of protected left main with a 4.0x15 mm Resolute Onyx DES 11/3. LIMA-LAD patent. SVG-PDA patent.  Echo: EF 30-35%, mechanical AoV ok.   Markedly volume initially w/ CVP of 20. Poor response to diuretics. Started on Lasix gtt, 12 mg/hr + metolazone. Milrinone begun with low co-ox 44%.  On milrinone 0.25. Yesterday developed recurrent hypotension and hyperkalemia. Co--ox 45% yesterday.  Received Lokelma. Losartan, spiro and carvedilol held.  Co-ox 62% Creatinine 1.9 -> 1.4. K 3.7. Excellent urine output. 4.3L out. Weight down 3 pounds. CVP 10-11  He has been in atrial fibrillation with controlled rate, which appears to be a new diagnosis this admission. He is on amiodarone gtt.    Objective:   Weight Range: 78 kg Body mass index is 26.15 kg/m.   Vital Signs:   Temp:  [96.1 F (35.6 C)-99 F (37.2 C)] 99 F (37.2 C) (11/07 1203) Pulse Rate:  [49-115] 68 (11/07 1000) Resp:  [12-24] 13 (11/07 1000) BP: (90-159)/(24-105) 110/100 (11/07 1000) SpO2:  [60 %-100 %] 100 % (11/07 1000) Weight:  [78 kg] 78 kg (11/07 0450) Last BM Date: 10/13/19  Weight change: Filed Weights   10/12/19 0600 10/13/19 0500 10/14/19 0450  Weight: 78.1 kg 79.1 kg 78 kg    Intake/Output:   Intake/Output Summary (Last 24 hours) at 10/14/2019 1310 Last data filed at 10/14/2019 1000 Gross per 24 hour  Intake 1996.06 ml  Output 3850 ml  Net -1853.94 ml      Physical Exam    CVP 10-11 General:  Sitting in chair No resp difficulty HEENT: normal Neck: supple.JVP to jaw . Carotids 2+ bilat; no bruits. No lymphadenopathy or thryomegaly appreciated. Cor: PMI nondisplaced. irregular rate & rhythm. No rubs, gallops or murmurs. Lungs: clear Abdomen: soft, nontender,  nondistended. No hepatosplenomegaly. No bruits or masses. Good bowel sounds. Extremities: no cyanosis, clubbing, rash, 2+ edema + UNNA boots Neuro: alert & orientedx3, cranial nerves grossly intact. moves all 4 extremities w/o difficulty. Affect pleasant    Telemetry   Afib w/ CVR in the 70s (personally reviewed)  EKG    N/a   Labs    CBC Recent Labs    10/13/19 0500 10/14/19 0442  WBC 8.0 7.9  HGB 8.6* 9.4*  HCT 28.2* 30.9*  MCV 78.1* 78.0*  PLT 244 696   Basic Metabolic Panel Recent Labs    10/12/19 0306 10/13/19 0500  10/13/19 1522 10/14/19 0442  NA 132* 132*   < > 128* 132*  K 3.5 4.5   < > 5.6* 3.7  CL 91* 92*   < > 90* 88*  CO2 30 30   < > 28 31  GLUCOSE 257* 170*   < > 352* 128*  BUN 23 23   < > 26* 24*  CREATININE 1.20 1.34*   < > 1.91* 1.42*  CALCIUM 8.8* 9.2   < > 9.2 9.4  MG 1.7 2.1  --   --   --    < > = values in this interval not displayed.   Liver Function Tests No results for input(s): AST, ALT, ALKPHOS, BILITOT, PROT, ALBUMIN in the last 72 hours. No results for input(s): LIPASE, AMYLASE in the last 72 hours. Cardiac Enzymes No results for input(s): CKTOTAL, CKMB,  CKMBINDEX, TROPONINI in the last 72 hours.  BNP: BNP (last 3 results) Recent Labs    09/16/19 1008 10/06/19 0958  BNP 676.4* 899.1*    ProBNP (last 3 results) Recent Labs    10/02/19 1131  PROBNP 3,116*     D-Dimer No results for input(s): DDIMER in the last 72 hours. Hemoglobin A1C No results for input(s): HGBA1C in the last 72 hours. Fasting Lipid Panel No results for input(s): CHOL, HDL, LDLCALC, TRIG, CHOLHDL, LDLDIRECT in the last 72 hours. Thyroid Function Tests No results for input(s): TSH, T4TOTAL, T3FREE, THYROIDAB in the last 72 hours.  Invalid input(s): FREET3  Other results:   Imaging    No results found.   Medications:     Scheduled Medications: . aspirin EC  81 mg Oral Daily  . atorvastatin  80 mg Oral Daily  . Chlorhexidine  Gluconate Cloth  6 each Topical Daily  . clopidogrel  75 mg Oral Daily  . digoxin  0.125 mg Oral Daily  . insulin aspart  0-15 Units Subcutaneous TID WC  . insulin aspart  0-5 Units Subcutaneous QHS  . insulin aspart  4 Units Subcutaneous TID WC  . insulin glargine  18 Units Subcutaneous Daily  . pantoprazole  40 mg Oral Daily  . sodium chloride flush  10-40 mL Intracatheter Q12H  . sodium chloride flush  3 mL Intravenous Once  . sodium chloride flush  3 mL Intravenous Q12H  . sodium chloride flush  3 mL Intravenous Q12H  . sodium chloride flush  3 mL Intravenous Q12H  . sodium chloride flush  3 mL Intravenous Q12H  . warfarin  5 mg Oral ONCE-1800  . Warfarin - Pharmacist Dosing Inpatient   Does not apply q1800    Infusions: . sodium chloride Stopped (10/13/19 0801)  . sodium chloride 20 mL/hr at 10/14/19 1000  . sodium chloride    . sodium chloride    . amiodarone 30 mg/hr (10/14/19 1000)  . furosemide (LASIX) infusion 12 mg/hr (10/14/19 1000)  . milrinone 0.25 mcg/kg/min (10/14/19 1000)    PRN Medications: sodium chloride, acetaminophen, nitroGLYCERIN, ondansetron (ZOFRAN) IV, sodium chloride flush, sodium chloride flush, sodium chloride flush, sodium chloride flush    Assessment/Plan   1. Acute systolic CHF: Ischemic cardiomyopathy.  Echo this admission with EF 30-35% and wall motion abnormalities, prior echo earlier this month showed EF 50-55%.  Cath 10/20 showed jeopardized ramus and LCx territory (99% distal left main, occluded LAD, patent LIMA-LAD).  Concerned that ischemia in this territory has triggered CHF.  Echo showed lateral wall hypokinesis.  With elevated LFTs and creatinine at admission, there was initial concern for low output/cardiogenic shock physiology but initial co-ox was 67%.  Digoxin added 11/2. Co-ox dropped to 41% on 11/4. Started on 0.25 of milrinone 11/4 for low output and to help w/ diuresis.Hypotensive yesterday. Now of spiro, losartan and carvedilol.  Milrinone increased to 0.25. On lasix gtt at 12. Excellent urine output. Renal function better. CVP down to 10-11. Co-oc 62% - Continue milrinone and lasix gtt one more day. Wean milrinone in am.  - Continue to hold losartan, spiro and carvedilol with episode of hypotension on 11/6 - Continue digoxin 0.125 mg (level 0.3 on 11/6) 2. CAD: Complicated disease, coronary angiography 10/20 showed patent SVG-RCA territory and patent LIMA-LAD.  The proximal LAD was occluded and there was 99% distal left main stenosis.  This left the ramus and LCx in jeopardy.  He was not a good candidate for protected PCI =>  cannot place Impella with mechanical aortic valve and peripheral vascular disease likely precludes a femoral IABP.  It was decided to try to manage him medically.  However, he returned with progressive chest pain episodes and NSTEMI, hs-TnI up to 935.  S/p successful orbital atherectomy, PTCA, and stenting of the left main with a 4.0x15 mm Resolute Onyx DES 11/3. Denies CP - Plan triple therapy w/ ASA, Plavix + Coumadin x 30 days. Stop ASA after 1 month. Plavix for at least 6 months if tolerated.  - PPI for GI protection, Protonix 40  - Continue atorvastatin 80 daily. LDL goal <70.   3. Mechanical aortic valve: Stable on echo this admission. Crisp mechanical sounds on exam - Continue ASA 81.  - INR 2.7 today. Off heparin. Continue warfarin and will aim for INR 2.5-3. Discussed dosing with PharmD personally. 4. NSVT:amiodarone gtt started. Improved. This was likely due to ischemia. Had sinus pauses x 2 during sleep 11/2, up to 3.2 sec. No recurrence.  - Continue amiodarone gtt  - Replete K and Mg as needed to keep K >4.0 and Mg >2.0.  5. H/o GI bleeding: no overt bleeding (normal-appearing BM since admit).  INR 8 initially, now improved after dose of vit k. INR 2.0. Hgb stable today at 8.6. Requiring triple therapy x 30 days for newly placed LM DES + Afib and mechanical AV. He has had feraheme.  No overt  bleeding.  - Continue PPI for GI protection. 6. PAD: Patient with aorto-bifemoral bypass, h/o iliac stenting, h/o femoral endarterectomy, PCI to right SFA in 2017.  7. H/o renal artery stenosis.  8. AKI: Resolved.  Follow creatinine closely.   9. Elevated LFTs: Shock liver versus congestive hepatopathy.  LFTs have been trending down. Do not think that amiodarone is the cause (elevated LFTs prior to amiodarone).  - continue diuresis 10. DM2: CBGs in the 200s. Cover with sliding scale + [place]. Will add meal time coverage.  Carb modified diet 11. Atrial fibrillation: Atrial fibrillation is new. Rate controlled in 70s. INR therapeutic on warfarin.  Had planned TEE-guided DCCV today but still very volume overloaded and on milrinone, so will defer to Monday.  - TEE-DCCV scheduled Monday at 12.  - Hopefully can wean off milrinone by DCCV. See plan as above 12. AKI - due to ATN +/- contrast nephropathy - resolved  Can go to [facility] if bed available.    Glori Bickers MD 10/14/2019 1:10 PM

## 2019-10-15 LAB — BASIC METABOLIC PANEL
Anion gap: 13 (ref 5–15)
BUN: 26 mg/dL — ABNORMAL HIGH (ref 8–23)
CO2: 34 mmol/L — ABNORMAL HIGH (ref 22–32)
Calcium: 9.3 mg/dL (ref 8.9–10.3)
Chloride: 84 mmol/L — ABNORMAL LOW (ref 98–111)
Creatinine, Ser: 1.44 mg/dL — ABNORMAL HIGH (ref 0.61–1.24)
GFR calc Af Amer: 55 mL/min — ABNORMAL LOW (ref >60)
GFR calc non Af Amer: 47 mL/min — ABNORMAL LOW (ref >60)
Glucose, Bld: 129 mg/dL — ABNORMAL HIGH (ref 70–99)
Potassium: 3.4 mmol/L — ABNORMAL LOW (ref 3.5–5.1)
Sodium: 131 mmol/L — ABNORMAL LOW (ref 135–145)

## 2019-10-15 LAB — GLUCOSE, CAPILLARY
Glucose-Capillary: 120 mg/dL — ABNORMAL HIGH (ref 70–99)
Glucose-Capillary: 128 mg/dL — ABNORMAL HIGH (ref 70–99)
Glucose-Capillary: 134 mg/dL — ABNORMAL HIGH (ref 70–99)
Glucose-Capillary: 165 mg/dL — ABNORMAL HIGH (ref 70–99)
Glucose-Capillary: 176 mg/dL — ABNORMAL HIGH (ref 70–99)
Glucose-Capillary: 340 mg/dL — ABNORMAL HIGH (ref 70–99)

## 2019-10-15 LAB — CBC
HCT: 30.8 % — ABNORMAL LOW (ref 39.0–52.0)
Hemoglobin: 9.5 g/dL — ABNORMAL LOW (ref 13.0–17.0)
MCH: 23.9 pg — ABNORMAL LOW (ref 26.0–34.0)
MCHC: 30.8 g/dL (ref 30.0–36.0)
MCV: 77.4 fL — ABNORMAL LOW (ref 80.0–100.0)
Platelets: 286 10*3/uL (ref 150–400)
RBC: 3.98 MIL/uL — ABNORMAL LOW (ref 4.22–5.81)
RDW: 28.6 % — ABNORMAL HIGH (ref 11.5–15.5)
WBC: 8.7 10*3/uL (ref 4.0–10.5)
nRBC: 3.4 % — ABNORMAL HIGH (ref 0.0–0.2)

## 2019-10-15 LAB — COOXEMETRY PANEL
Carboxyhemoglobin: 1.6 % — ABNORMAL HIGH (ref 0.5–1.5)
Methemoglobin: 1.1 % (ref 0.0–1.5)
O2 Saturation: 64.1 %
Total hemoglobin: 9.6 g/dL — ABNORMAL LOW (ref 12.0–16.0)

## 2019-10-15 LAB — PROTIME-INR
INR: 2.2 — ABNORMAL HIGH (ref 0.8–1.2)
Prothrombin Time: 23.9 seconds — ABNORMAL HIGH (ref 11.4–15.2)

## 2019-10-15 MED ORDER — FUROSEMIDE 40 MG PO TABS
40.0000 mg | ORAL_TABLET | Freq: Every day | ORAL | Status: DC
  Administered 2019-10-16 – 2019-10-17 (×2): 40 mg via ORAL
  Filled 2019-10-15 (×2): qty 1

## 2019-10-15 MED ORDER — SPIRONOLACTONE 12.5 MG HALF TABLET
12.5000 mg | ORAL_TABLET | Freq: Every day | ORAL | Status: DC
  Administered 2019-10-15 – 2019-10-17 (×3): 12.5 mg via ORAL
  Filled 2019-10-15 (×3): qty 1

## 2019-10-15 MED ORDER — WARFARIN SODIUM 7.5 MG PO TABS
7.5000 mg | ORAL_TABLET | Freq: Once | ORAL | Status: AC
  Administered 2019-10-15: 7.5 mg via ORAL
  Filled 2019-10-15: qty 1

## 2019-10-15 MED ORDER — SODIUM CHLORIDE 0.9 % IV SOLN
INTRAVENOUS | Status: DC
  Administered 2019-10-15: 14:00:00 via INTRAVENOUS

## 2019-10-15 MED ORDER — POTASSIUM CHLORIDE CRYS ER 20 MEQ PO TBCR
40.0000 meq | EXTENDED_RELEASE_TABLET | Freq: Once | ORAL | Status: AC
  Administered 2019-10-15: 40 meq via ORAL
  Filled 2019-10-15: qty 2

## 2019-10-15 NOTE — H&P (View-Only) (Signed)
Patient ID: [name] [name], male   DOB: 1946/05/22, 73 y.o.   MRN: [id number]     Advanced Heart Failure Rounding Note  PCP-Cardiologist: Jenkins Rouge, MD   Subjective:    S/p successful orbital atherectomy, PTCA, and stenting of protected left main with a 4.0x15 mm Resolute Onyx DES 11/3. LIMA-LAD patent. SVG-PDA patent.  Echo: EF 30-35%, mechanical AoV ok.   Markedly volume initially w/ CVP of 20. Poor response to diuretics. Started on Lasix gtt, 12 mg/hr + metolazone. Milrinone begun with low co-ox 44%.  On milrinone 0.25. Yesterday developed recurrent hypotension and hyperkalemia. Co--ox 45% on 11/6.  Received Lokelma. Losartan, spiro and carvedilol held.  Remains on lasix gtt at 12. Milrinone 0.125. Weight down another 6 pounds.  Co-ox 64% Creatinine stable at 1.4. K 3.4. CVP 7  Feeling good. Denies CP, SOB, orthopnea or PND. No palpitations   He has been in atrial fibrillation with controlled rate, which appears to be a new diagnosis this admission. He is on amiodarone gtt.    Objective:   Weight Range: 75.2 kg Body mass index is 25.21 kg/m.   Vital Signs:   Temp:  [97.4 F (36.3 C)-99 F (37.2 C)] 97.4 F (36.3 C) (11/08 0755) Pulse Rate:  [66-74] 74 (11/08 0755) Resp:  [15-21] 15 (11/07 1939) BP: (75-146)/(28-85) 105/44 (11/08 0755) SpO2:  [92 %-100 %] 92 % (11/08 0755) Weight:  [75.2 kg] 75.2 kg (11/08 0442) Last BM Date: 10/14/19  Weight change: Filed Weights   10/13/19 0500 10/14/19 0450 10/15/19 0442  Weight: 79.1 kg 78 kg 75.2 kg    Intake/Output:   Intake/Output Summary (Last 24 hours) at 10/15/2019 1115 Last data filed at 10/15/2019 1000 Gross per 24 hour  Intake 1530.33 ml  Output 5025 ml  Net -3494.67 ml      Physical Exam    CVP 7 General:  Well appearing. No resp difficulty HEENT: normal Neck: supple. JVP 7. Carotids 2+ bilat; no bruits. No lymphadenopathy or thryomegaly appreciated. Cor: PMI nondisplaced.Irregular rate & rhythm. No  rubs, gallops or murmurs. Lungs: clear Abdomen: soft, nontender, nondistended. No hepatosplenomegaly. No bruits or masses. Good bowel sounds. Extremities: no cyanosis, clubbing, rash, trace edema +Unna boots Neuro: alert & orientedx3, cranial nerves grossly intact. moves all 4 extremities w/o difficulty. Affect pleasant    Telemetry   Afib w/ CVR in the 70s (personally reviewed)  EKG    N/a   Labs    CBC Recent Labs    10/14/19 0442 10/15/19 0604  WBC 7.9 8.7  HGB 9.4* 9.5*  HCT 30.9* 30.8*  MCV 78.0* 77.4*  PLT 261 657   Basic Metabolic Panel Recent Labs    10/13/19 0500  10/14/19 0442 10/15/19 0604  NA 132*   < > 132* 131*  K 4.5   < > 3.7 3.4*  CL 92*   < > 88* 84*  CO2 30   < > 31 34*  GLUCOSE 170*   < > 128* 129*  BUN 23   < > 24* 26*  CREATININE 1.34*   < > 1.42* 1.44*  CALCIUM 9.2   < > 9.4 9.3  MG 2.1  --   --   --    < > = values in this interval not displayed.   Liver Function Tests No results for input(s): AST, ALT, ALKPHOS, BILITOT, PROT, ALBUMIN in the last 72 hours. No results for input(s): LIPASE, AMYLASE in the last 72 hours. Cardiac Enzymes No results for  input(s): CKTOTAL, CKMB, CKMBINDEX, TROPONINI in the last 72 hours.  BNP: BNP (last 3 results) Recent Labs    09/16/19 1008 10/06/19 0958  BNP 676.4* 899.1*    ProBNP (last 3 results) Recent Labs    10/02/19 1131  PROBNP 3,116*     D-Dimer No results for input(s): DDIMER in the last 72 hours. Hemoglobin A1C No results for input(s): HGBA1C in the last 72 hours. Fasting Lipid Panel No results for input(s): CHOL, HDL, LDLCALC, TRIG, CHOLHDL, LDLDIRECT in the last 72 hours. Thyroid Function Tests No results for input(s): TSH, T4TOTAL, T3FREE, THYROIDAB in the last 72 hours.  Invalid input(s): FREET3  Other results:   Imaging    No results found.   Medications:     Scheduled Medications: . aspirin EC  81 mg Oral Daily  . atorvastatin  80 mg Oral Daily  .  Chlorhexidine Gluconate Cloth  6 each Topical Daily  . clopidogrel  75 mg Oral Daily  . digoxin  0.125 mg Oral Daily  . insulin aspart  0-15 Units Subcutaneous TID WC  . insulin aspart  0-5 Units Subcutaneous QHS  . insulin aspart  4 Units Subcutaneous TID WC  . insulin glargine  18 Units Subcutaneous Daily  . pantoprazole  40 mg Oral Daily  . sodium chloride flush  10-40 mL Intracatheter Q12H  . sodium chloride flush  3 mL Intravenous Once  . sodium chloride flush  3 mL Intravenous Q12H  . warfarin  7.5 mg Oral ONCE-1800  . Warfarin - Pharmacist Dosing Inpatient   Does not apply q1800    Infusions: . sodium chloride Stopped (10/13/19 0801)  . sodium chloride    . amiodarone 30 mg/hr (10/14/19 1600)  . furosemide (LASIX) infusion 12 mg/hr (10/15/19 0841)  . milrinone 0.25 mcg/kg/min (10/14/19 1600)    PRN Medications: sodium chloride, acetaminophen, nitroGLYCERIN, ondansetron (ZOFRAN) IV, sodium chloride flush, sodium chloride flush    Assessment/Plan   1. Acute systolic CHF: Ischemic cardiomyopathy.  Echo this admission with EF 30-35% and wall motion abnormalities, prior echo earlier this month showed EF 50-55%.  Cath 10/20 showed jeopardized ramus and LCx territory (99% distal left main, occluded LAD, patent LIMA-LAD).  Concerned that ischemia in this territory has triggered CHF.  Echo showed lateral wall hypokinesis.  With elevated LFTs and creatinine at admission, there was initial concern for low output/cardiogenic shock physiology but initial co-ox was 67%.  Digoxin added 11/2. Co-ox dropped to 41% on 11/4. Started on 0.25 of milrinone 11/4 for low output and to help w/ diuresis.Hypotensive yesterday. Now of spiro, losartan and carvedilol. Milrinone increased to 0.25. On lasix gtt at 12. Excellent urine output. Renal function better. CVP down to 7. Co-ox 64% - Will stop milrinone and lasix gtt today - Start po diuretics and spiro - Continue to hold losartan and carvedilol with  episode of hypotension on 11/6. Given profound drop in pressure with losartan would be reluctant to restart for now.  - Continue digoxin 0.125 mg (level 0.3 on 11/6) 2. CAD: Complicated disease, coronary angiography 10/20 showed patent SVG-RCA territory and patent LIMA-LAD.  The proximal LAD was occluded and there was 99% distal left main stenosis.  This left the ramus and LCx in jeopardy.  He was not a good candidate for protected PCI => cannot place Impella with mechanical aortic valve and peripheral vascular disease likely precludes a femoral IABP.  It was decided to try to manage him medically.  However, he returned with progressive chest  pain episodes and NSTEMI, hs-TnI up to 935.  S/p successful orbital atherectomy, PTCA, and stenting of the left main with a 4.0x15 mm Resolute Onyx DES 11/3. Denies CP today.  - Plan triple therapy w/ ASA, Plavix + Coumadin x 30 days. Stop ASA after 1 month. Plavix for at least 6 months if tolerated.  - PPI for GI protection, Protonix 40  - Continue atorvastatin 80 daily. LDL goal <70.   3. Mechanical aortic valve: Stable on echo this admission. Crisp mechanical sounds on exam - Continue ASA 81.  - INR 2.2 today. Off heparin. Continue warfarin and will aim for INR 2.5-3. Discussed dosing with PharmD personally. 4. NSVT:amiodarone gtt started. Improved. This was likely due to ischemia. Had sinus pauses x 2 during sleep 11/2, up to 3.2 sec. No recurrence.  - Continue amiodarone gtt pending DC-CV in am - Replete K and Mg as needed to keep K >4.0 and Mg >2.0.  5. H/o GI bleeding: no overt bleeding (normal-appearing BM since admit).  INR 8 initially, now improved after dose of vit k. INR 2.0. Hgb stable today at 8.6. Requiring triple therapy x 30 days for newly placed LM DES + Afib and mechanical AV. He has had feraheme.  No overt bleeding.  - Continue PPI for GI protection. 6. PAD: Patient with aorto-bifemoral bypass, h/o iliac stenting, h/o femoral endarterectomy,  PCI to right SFA in 2017.  7. H/o renal artery stenosis.  8. AKI: Resolved.  Follow creatinine closely.   9. Elevated LFTs: Shock liver versus congestive hepatopathy.  LFTs have been trending down. Do not think that amiodarone is the cause (elevated LFTs prior to amiodarone).  - stable 10. DM2: CBGs in the 200s. Cover with sliding scale + Lantis. Will add meal time coverage.  Carb modified diet 11. Atrial fibrillation: Atrial fibrillation is new. Rate controlled in 70s. INR therapeutic on warfarin.  Had planned TEE-guided DCCV today but still very volume overloaded and on milrinone, so will defer to Monday.  - TEE-DCCV scheduled tomorrow  at 12.  - Will stop milrinone. NPO after MN.  - Continue IV amio today 12. AKI - due to ATN +/- contrast nephropathy - resolved   Glori Bickers MD 10/15/2019 11:15 AM

## 2019-10-15 NOTE — Progress Notes (Signed)
ANTICOAGULATION CONSULT NOTE  Pharmacy Consult for Warfarin Indication: mech AVR and new AFib  No Known Allergies  Patient Measurements: Height: 5\' 8"  (172.7 cm) Weight: 165 lb 12.8 oz (75.2 kg) IBW/kg (Calculated) : 68.4 Heparin dosing weight: 79 kg  Vital Signs: Temp: 98 F (36.7 C) (11/08 0442) Temp Source: Oral (11/08 0442) BP: 131/44 (11/08 0442) Pulse Rate: 71 (11/08 0442)  Labs: Recent Labs    10/12/19 0955  10/12/19 1535 10/13/19 0500  10/13/19 1522 10/14/19 0442 10/15/19 0604  HGB  --    < > 8.3* 8.6*  --   --  9.4*  --   HCT  --   --  27.5* 28.2*  --   --  30.9*  --   PLT  --   --  277 244  --   --  261  --   LABPROT  --   --   --  26.6*  --   --  28.4* 23.9*  INR  --   --   --  2.5*  --   --  2.7* 2.2*  HEPARINUNFRC 0.63  --   --   --   --   --   --   --   CREATININE  --   --   --  1.34*   < > 1.91* 1.42* 1.44*   < > = values in this interval not displayed.    Estimated Creatinine Clearance: 43.5 mL/min (A) (by C-G formula based on SCr of 1.44 mg/dL (H)).  Assessment: Patient is a 73 year old male who presented with shortness of breath and chest pain. Recent cath showed occluded proxLAD and 99% distal left main stenosis. Underwent PCI on 11/3 with placement of DES to LM and placed on triple therapy.  Patient is on warfarin PTA for mechanical AVR (INR goal 2.5-3.5). INR on admit was 8.0 and was given vitamin K > !NR down to 1.5 PTA warfarin Dose: 5 mg daily except 10 mg on Su, Tu, Th  INR subtherapeutic today at 2.2. CBC stable with no overt bleeding. Has a higher INR goal given mechanical AVR with new onset AF, but given that he is on triple therapy post DES, will target the lower end of higher goal. New Afib new amiodarone which can interact with warfarin - assume he will require lower than PTA doses   Goal of Therapy:  INR 2.5-3.0 Monitor platelets by anticoagulation protocol: Yes   Plan:  Warfarin 7.5 mg PO x1  Daily INR, CBC  Monitor for  bleeding Planning DCCV for 11/9  Lorel Monaco, PharmD PGY1 Ambulatory Care Resident [employer] # [phone]   Please check [email or web address] for unit-specific pharmacy phone numbers.

## 2019-10-15 NOTE — Progress Notes (Signed)
Patient ID: [name] [name], male   DOB: [birth year], 73 y.o.   MRN: [id number]     Advanced Heart Failure Rounding Note  PCP-Cardiologist: Jenkins Rouge, MD   Subjective:    S/p successful orbital atherectomy, PTCA, and stenting of protected left main with a 4.0x15 mm Resolute Onyx DES 11/3. LIMA-LAD patent. SVG-PDA patent.  Echo: EF 30-35%, mechanical AoV ok.   Markedly volume initially w/ CVP of 20. Poor response to diuretics. Started on Lasix gtt, 12 mg/hr + metolazone. Milrinone begun with low co-ox 44%.  On milrinone 0.25. Yesterday developed recurrent hypotension and hyperkalemia. Co--ox 45% on 11/6.  Received Lokelma. Losartan, spiro and carvedilol held.  Remains on lasix gtt at 12. Milrinone 0.125. Weight down another 6 pounds.  Co-ox 64% Creatinine stable at 1.4. K 3.4. CVP 7  Feeling good. Denies CP, SOB, orthopnea or PND. No palpitations   He has been in atrial fibrillation with controlled rate, which appears to be a new diagnosis this admission. He is on amiodarone gtt.    Objective:   Weight Range: 75.2 kg Body mass index is 25.21 kg/m.   Vital Signs:   Temp:  [97.4 F (36.3 C)-99 F (37.2 C)] 97.4 F (36.3 C) (11/08 0755) Pulse Rate:  [66-74] 74 (11/08 0755) Resp:  [15-21] 15 (11/07 1939) BP: (75-146)/(28-85) 105/44 (11/08 0755) SpO2:  [92 %-100 %] 92 % (11/08 0755) Weight:  [75.2 kg] 75.2 kg (11/08 0442) Last BM Date: 10/14/19  Weight change: Filed Weights   10/13/19 0500 10/14/19 0450 10/15/19 0442  Weight: 79.1 kg 78 kg 75.2 kg    Intake/Output:   Intake/Output Summary (Last 24 hours) at 10/15/2019 1115 Last data filed at 10/15/2019 1000 Gross per 24 hour  Intake 1530.33 ml  Output 5025 ml  Net -3494.67 ml      Physical Exam    CVP 7 General:  Well appearing. No resp difficulty HEENT: normal Neck: supple. JVP 7. Carotids 2+ bilat; no bruits. No lymphadenopathy or thryomegaly appreciated. Cor: PMI nondisplaced.Irregular rate & rhythm. No  rubs, gallops or murmurs. Lungs: clear Abdomen: soft, nontender, nondistended. No hepatosplenomegaly. No bruits or masses. Good bowel sounds. Extremities: no cyanosis, clubbing, rash, trace edema +Unna boots Neuro: alert & orientedx3, cranial nerves grossly intact. moves all 4 extremities w/o difficulty. Affect pleasant    Telemetry   Afib w/ CVR in the 70s (personally reviewed)  EKG    N/a   Labs    CBC Recent Labs    10/14/19 0442 10/15/19 0604  WBC 7.9 8.7  HGB 9.4* 9.5*  HCT 30.9* 30.8*  MCV 78.0* 77.4*  PLT 261 914   Basic Metabolic Panel Recent Labs    10/13/19 0500  10/14/19 0442 10/15/19 0604  NA 132*   < > 132* 131*  K 4.5   < > 3.7 3.4*  CL 92*   < > 88* 84*  CO2 30   < > 31 34*  GLUCOSE 170*   < > 128* 129*  BUN 23   < > 24* 26*  CREATININE 1.34*   < > 1.42* 1.44*  CALCIUM 9.2   < > 9.4 9.3  MG 2.1  --   --   --    < > = values in this interval not displayed.   Liver Function Tests No results for input(s): AST, ALT, ALKPHOS, BILITOT, PROT, ALBUMIN in the last 72 hours. No results for input(s): LIPASE, AMYLASE in the last 72 hours. Cardiac Enzymes No results for  input(s): CKTOTAL, CKMB, CKMBINDEX, TROPONINI in the last 72 hours.  BNP: BNP (last 3 results) Recent Labs    09/16/19 1008 10/06/19 0958  BNP 676.4* 899.1*    ProBNP (last 3 results) Recent Labs    10/02/19 1131  PROBNP 3,116*     D-Dimer No results for input(s): DDIMER in the last 72 hours. Hemoglobin A1C No results for input(s): HGBA1C in the last 72 hours. Fasting Lipid Panel No results for input(s): CHOL, HDL, LDLCALC, TRIG, CHOLHDL, LDLDIRECT in the last 72 hours. Thyroid Function Tests No results for input(s): TSH, T4TOTAL, T3FREE, THYROIDAB in the last 72 hours.  Invalid input(s): FREET3  Other results:   Imaging    No results found.   Medications:     Scheduled Medications: . aspirin EC  81 mg Oral Daily  . atorvastatin  80 mg Oral Daily  .  Chlorhexidine Gluconate Cloth  6 each Topical Daily  . clopidogrel  75 mg Oral Daily  . digoxin  0.125 mg Oral Daily  . insulin aspart  0-15 Units Subcutaneous TID WC  . insulin aspart  0-5 Units Subcutaneous QHS  . insulin aspart  4 Units Subcutaneous TID WC  . insulin glargine  18 Units Subcutaneous Daily  . pantoprazole  40 mg Oral Daily  . sodium chloride flush  10-40 mL Intracatheter Q12H  . sodium chloride flush  3 mL Intravenous Once  . sodium chloride flush  3 mL Intravenous Q12H  . warfarin  7.5 mg Oral ONCE-1800  . Warfarin - Pharmacist Dosing Inpatient   Does not apply q1800    Infusions: . sodium chloride Stopped (10/13/19 0801)  . sodium chloride    . amiodarone 30 mg/hr (10/14/19 1600)  . furosemide (LASIX) infusion 12 mg/hr (10/15/19 0841)  . milrinone 0.25 mcg/kg/min (10/14/19 1600)    PRN Medications: sodium chloride, acetaminophen, nitroGLYCERIN, ondansetron (ZOFRAN) IV, sodium chloride flush, sodium chloride flush    Assessment/Plan   1. Acute systolic CHF: Ischemic cardiomyopathy.  Echo this admission with EF 30-35% and wall motion abnormalities, prior echo earlier this month showed EF 50-55%.  Cath 10/20 showed jeopardized ramus and LCx territory (99% distal left main, occluded LAD, patent LIMA-LAD).  Concerned that ischemia in this territory has triggered CHF.  Echo showed lateral wall hypokinesis.  With elevated LFTs and creatinine at admission, there was initial concern for low output/cardiogenic shock physiology but initial co-ox was 67%.  Digoxin added 11/2. Co-ox dropped to 41% on 11/4. Started on 0.25 of milrinone 11/4 for low output and to help w/ diuresis.Hypotensive yesterday. Now of spiro, losartan and carvedilol. Milrinone increased to 0.25. On lasix gtt at 12. Excellent urine output. Renal function better. CVP down to 7. Co-ox 64% - Will stop milrinone and lasix gtt today - Start po diuretics and spiro - Continue to hold losartan and carvedilol with  episode of hypotension on 11/6. Given profound drop in pressure with losartan would be reluctant to restart for now.  - Continue digoxin 0.125 mg (level 0.3 on 11/6) 2. CAD: Complicated disease, coronary angiography 10/20 showed patent SVG-RCA territory and patent LIMA-LAD.  The proximal LAD was occluded and there was 99% distal left main stenosis.  This left the ramus and LCx in jeopardy.  He was not a good candidate for protected PCI => cannot place Impella with mechanical aortic valve and peripheral vascular disease likely precludes a femoral IABP.  It was decided to try to manage him medically.  However, he returned with progressive chest  pain episodes and NSTEMI, hs-TnI up to 935.  S/p successful orbital atherectomy, PTCA, and stenting of the left main with a 4.0x15 mm Resolute Onyx DES 11/3. Denies CP today.  - Plan triple therapy w/ ASA, Plavix + Coumadin x 30 days. Stop ASA after 1 month. Plavix for at least 6 months if tolerated.  - PPI for GI protection, Protonix 40  - Continue atorvastatin 80 daily. LDL goal <70.   3. Mechanical aortic valve: Stable on echo this admission. Crisp mechanical sounds on exam - Continue ASA 81.  - INR 2.2 today. Off heparin. Continue warfarin and will aim for INR 2.5-3. Discussed dosing with PharmD personally. 4. NSVT:amiodarone gtt started. Improved. This was likely due to ischemia. Had sinus pauses x 2 during sleep 11/2, up to 3.2 sec. No recurrence.  - Continue amiodarone gtt pending DC-CV in am - Replete K and Mg as needed to keep K >4.0 and Mg >2.0.  5. H/o GI bleeding: no overt bleeding (normal-appearing BM since admit).  INR 8 initially, now improved after dose of vit k. INR 2.0. Hgb stable today at 8.6. Requiring triple therapy x 30 days for newly placed LM DES + Afib and mechanical AV. He has had feraheme.  No overt bleeding.  - Continue PPI for GI protection. 6. PAD: Patient with aorto-bifemoral bypass, h/o iliac stenting, h/o femoral endarterectomy,  PCI to right SFA in 2017.  7. H/o renal artery stenosis.  8. AKI: Resolved.  Follow creatinine closely.   9. Elevated LFTs: Shock liver versus congestive hepatopathy.  LFTs have been trending down. Do not think that amiodarone is the cause (elevated LFTs prior to amiodarone).  - stable 10. DM2: CBGs in the 200s. Cover with sliding scale + Lantis. Will add meal time coverage.  Carb modified diet 11. Atrial fibrillation: Atrial fibrillation is new. Rate controlled in 70s. INR therapeutic on warfarin.  Had planned TEE-guided DCCV today but still very volume overloaded and on milrinone, so will defer to Monday.  - TEE-DCCV scheduled tomorrow  at 12.  - Will stop milrinone. NPO after MN.  - Continue IV amio today 12. AKI - due to ATN +/- contrast nephropathy - resolved   Glori Bickers MD 10/15/2019 11:15 AM

## 2019-10-16 ENCOUNTER — Encounter (HOSPITAL_COMMUNITY)
Admission: EM | Disposition: A | Discharge status: Home / Self Care | Payer: Self-pay | Source: Home / Self Care | Attending: Cardiology

## 2019-10-16 ENCOUNTER — Inpatient Hospital Stay (HOSPITAL_COMMUNITY): Payer: Medicare Other | Admitting: Certified Registered Nurse Anesthetist

## 2019-10-16 ENCOUNTER — Inpatient Hospital Stay (HOSPITAL_COMMUNITY): Payer: Medicare Other

## 2019-10-16 ENCOUNTER — Encounter (HOSPITAL_COMMUNITY): Payer: Self-pay

## 2019-10-16 ENCOUNTER — Ambulatory Visit: Payer: Medicare Other | Admitting: Student

## 2019-10-16 ENCOUNTER — Other Ambulatory Visit (HOSPITAL_COMMUNITY): Payer: Medicare Other

## 2019-10-16 DIAGNOSIS — I4891 Unspecified atrial fibrillation: Secondary | ICD-10-CM

## 2019-10-16 HISTORY — PX: CARDIOVERSION: SHX1299

## 2019-10-16 HISTORY — PX: TEE WITHOUT CARDIOVERSION: SHX5443

## 2019-10-16 LAB — GLUCOSE, CAPILLARY
Glucose-Capillary: 112 mg/dL — ABNORMAL HIGH (ref 70–99)
Glucose-Capillary: 190 mg/dL — ABNORMAL HIGH (ref 70–99)
Glucose-Capillary: 236 mg/dL — ABNORMAL HIGH (ref 70–99)
Glucose-Capillary: 425 mg/dL — ABNORMAL HIGH (ref 70–99)
Glucose-Capillary: 94 mg/dL (ref 70–99)

## 2019-10-16 LAB — BASIC METABOLIC PANEL
Anion gap: 9 (ref 5–15)
BUN: 19 mg/dL (ref 8–23)
CO2: 34 mmol/L — ABNORMAL HIGH (ref 22–32)
Calcium: 8.9 mg/dL (ref 8.9–10.3)
Chloride: 91 mmol/L — ABNORMAL LOW (ref 98–111)
Creatinine, Ser: 1.15 mg/dL (ref 0.61–1.24)
GFR calc Af Amer: >60 mL/min (ref >60)
GFR calc non Af Amer: >60 mL/min (ref >60)
Glucose, Bld: 100 mg/dL — ABNORMAL HIGH (ref 70–99)
Potassium: 3.6 mmol/L (ref 3.5–5.1)
Sodium: 134 mmol/L — ABNORMAL LOW (ref 135–145)

## 2019-10-16 LAB — CBC
HCT: 31.1 % — ABNORMAL LOW (ref 39.0–52.0)
Hemoglobin: 9.4 g/dL — ABNORMAL LOW (ref 13.0–17.0)
MCH: 23.9 pg — ABNORMAL LOW (ref 26.0–34.0)
MCHC: 30.2 g/dL (ref 30.0–36.0)
MCV: 78.9 fL — ABNORMAL LOW (ref 80.0–100.0)
Platelets: 291 10*3/uL (ref 150–400)
RBC: 3.94 MIL/uL — ABNORMAL LOW (ref 4.22–5.81)
RDW: 29.1 % — ABNORMAL HIGH (ref 11.5–15.5)
WBC: 7.3 10*3/uL (ref 4.0–10.5)
nRBC: 2.1 % — ABNORMAL HIGH (ref 0.0–0.2)

## 2019-10-16 LAB — COOXEMETRY PANEL
Carboxyhemoglobin: 1.7 % — ABNORMAL HIGH (ref 0.5–1.5)
Methemoglobin: 0.9 % (ref 0.0–1.5)
O2 Saturation: 61.3 %
Total hemoglobin: 9.7 g/dL — ABNORMAL LOW (ref 12.0–16.0)

## 2019-10-16 LAB — PROTIME-INR
INR: 2.3 — ABNORMAL HIGH (ref 0.8–1.2)
Prothrombin Time: 25 seconds — ABNORMAL HIGH (ref 11.4–15.2)

## 2019-10-16 SURGERY — ECHOCARDIOGRAM, TRANSESOPHAGEAL
Anesthesia: General

## 2019-10-16 MED ORDER — SODIUM CHLORIDE 0.9 % IV SOLN
INTRAVENOUS | Status: DC | PRN
  Administered 2019-10-16: 12:00:00 via INTRAVENOUS

## 2019-10-16 MED ORDER — LIDOCAINE 2% (20 MG/ML) 5 ML SYRINGE
INTRAMUSCULAR | Status: DC | PRN
  Administered 2019-10-16: 60 mg via INTRAVENOUS

## 2019-10-16 MED ORDER — WARFARIN SODIUM 7.5 MG PO TABS
7.5000 mg | ORAL_TABLET | Freq: Once | ORAL | Status: AC
  Administered 2019-10-16: 7.5 mg via ORAL
  Filled 2019-10-16: qty 1

## 2019-10-16 MED ORDER — BUTAMBEN-TETRACAINE-BENZOCAINE 2-2-14 % EX AERO
INHALATION_SPRAY | CUTANEOUS | Status: DC | PRN
  Administered 2019-10-16: 2 via TOPICAL

## 2019-10-16 MED ORDER — PROPOFOL 10 MG/ML IV BOLUS
INTRAVENOUS | Status: DC | PRN
  Administered 2019-10-16: 10 mg via INTRAVENOUS

## 2019-10-16 MED ORDER — PROPOFOL 500 MG/50ML IV EMUL
INTRAVENOUS | Status: DC | PRN
  Administered 2019-10-16: 100 ug/kg/min via INTRAVENOUS

## 2019-10-16 NOTE — Interval H&P Note (Signed)
History and Physical Interval Note:  10/16/2019 12:25 PM  [name] [name]  has presented today for surgery, with the diagnosis of afib.  The various methods of treatment have been discussed with the patient and family. After consideration of risks, benefits and other options for treatment, the patient has consented to  Procedure(s): TRANSESOPHAGEAL ECHOCARDIOGRAM (TEE) (N/A) CARDIOVERSION (N/A) as a surgical intervention.  The patient's history has been reviewed, patient examined, no change in status, stable for surgery.  I have reviewed the patient's chart and labs.  Questions were answered to the patient's satisfaction.     Shakeita Vandevander [employer]

## 2019-10-16 NOTE — Care Management Important Message (Signed)
Important Message  Patient Details  Name: [name] [name] MRN: [id number] Date of Birth: 1946/06/24   Medicare Important Message Given:  Yes     Shelda Altes 10/16/2019, 12:50 PM

## 2019-10-16 NOTE — Transfer of Care (Signed)
Immediate Anesthesia Transfer of Care Note  Patient: Bowdy Bair  Procedure(s) Performed: TRANSESOPHAGEAL ECHOCARDIOGRAM (TEE) (N/A ) CARDIOVERSION (N/A )  Patient Location: Endoscopy Unit  Anesthesia Type:General  Level of Consciousness: awake, patient cooperative and responds to stimulation  Airway & Oxygen Therapy: Patient Spontanous Breathing and Patient connected to nasal cannula oxygen  Post-op Assessment: Report given to RN and Post -op Vital signs reviewed and stable  Post vital signs: Reviewed and stable  Last Vitals:  Vitals Value Taken Time  BP 134/33 10/16/19 1258  Temp 36.6 C 10/16/19 1258  Pulse 59 10/16/19 1300  Resp 14 10/16/19 1300  SpO2 100 % 10/16/19 1300  Vitals shown include unvalidated device data.  Last Pain:  Vitals:   10/16/19 1258  TempSrc: Temporal  PainSc: 0-No pain      Patients Stated Pain Goal: 3 (17/49/44 9675)  Complications: No apparent anesthesia complications

## 2019-10-16 NOTE — Progress Notes (Signed)
[facility] for Warfarin Indication: mech AVR and new AFib  No Known Allergies  Patient Measurements: Height: 5\' 8"  (172.7 cm) Weight: 165 lb 9.6 oz (75.1 kg) IBW/kg (Calculated) : 68.4 Heparin dosing weight: 79 kg  Vital Signs: Temp: 98.2 F (36.8 C) (11/09 1405) Temp Source: Oral (11/09 1405) BP: 99/72 (11/09 1515) Pulse Rate: 63 (11/09 1405)  Labs: Recent Labs    10/14/19 0442 10/15/19 0604 10/16/19 0500  HGB 9.4* 9.5* 9.4*  HCT 30.9* 30.8* 31.1*  PLT 261 286 291  LABPROT 28.4* 23.9* 25.0*  INR 2.7* 2.2* 2.3*  CREATININE 1.42* 1.44* 1.15    Estimated Creatinine Clearance: 54.5 mL/min (by C-G formula based on SCr of 1.15 mg/dL).  Assessment: Patient is a 73 year old male who presented with shortness of breath and chest pain. Recent cath showed occluded proxLAD and 99% distal left main stenosis. Underwent PCI on 11/3 with placement of DES to LM and placed on triple therapy.  Patient is on warfarin PTA for mechanical AVR (INR goal 2.5-3.5). INR on admit was 8.0 and was given vitamin K > INR down to 1.5 PTA warfarin Dose: 5 mg daily except 10 mg on Su, Tu, Th  INR subtherapeutic today at 2.3. CBC stable with no overt bleeding. Has a higher INR goal given mechanical AVR with new onset AF, but given that he is on triple therapy post DES - use caution New Afib new amiodarone which can interact with warfarin - assume he will require lower than PTA doses   Goal of Therapy:  INR 2.5-3.0 Monitor platelets by anticoagulation protocol: Yes   Plan:  Warfarin 7.5 mg PO x1 repeat Daily INR, CBC  Monitor for bleeding Planning DCCV for 11/9  Bonnita Nasuti Pharm.D. CPP, BCPS Clinical Pharmacist [phone] 10/16/2019 4:35 PM      Please check [email or web address] for unit-specific pharmacy phone numbers.

## 2019-10-16 NOTE — Procedures (Signed)
Electrical Cardioversion Procedure Note [name] [name] [id number] 1946/03/31  Procedure: Electrical Cardioversion Indications:  Atrial Fibrillation  Procedure Details Consent: Risks of procedure as well as the alternatives and risks of each were explained to the (patient/caregiver).  Consent for procedure obtained. Time Out: Verified patient identification, verified procedure, site/side was marked, verified correct patient position, special equipment/implants available, medications/allergies/relevent history reviewed, required imaging and test results available.  Performed  Patient placed on cardiac monitor, pulse oximetry, supplemental oxygen as necessary.  Sedation given: Propofol Pacer pads placed anterior and posterior chest.  Cardioverted 1 time(s).  Cardioverted at [place].  Evaluation Findings: Post procedure EKG shows: NSR Complications: None Patient did tolerate procedure well.   Loralie Champagne 10/16/2019, 12:52 PM

## 2019-10-16 NOTE — Progress Notes (Addendum)
Patient ID: [name] [name], male   DOB: Nov 21, 1946, 73 y.o.   MRN: [id number]     Advanced Heart Failure Rounding Note  PCP-Cardiologist: Jenkins Rouge, MD   Subjective:    S/p successful orbital atherectomy, PTCA, and stenting of protected left main with a 4.0x15 mm Resolute Onyx DES 11/3. LIMA-LAD patent. SVG-PDA patent.  Echo: EF 30-35%, mechanical AoV ok.   Markedly volume overloaded initially w/ CVP of 20. Initial poor response to diuretics. Started on Lasix gtt, 12 mg/hr + metolazone. Milrinone begun with low co-ox 44%.  Hospital course c/b recurrent hypotension and hyperkalemia Received Lokelma. Losartan, spiro and carvedilol held>>spiro has since been resumed.   Lasix gtt and milrinone discontinued yesterday. Co-ox 61% today. On PO lasix, 40 daily. -2.1 L out yesterday.  Wt stable last 24 hrs at 165 lb. Scr improved, down from 1.44>>1.15. K 3.6. CVP 8. BP stable.   Remains in AFL w/ CVR. Scheduled for TEE/DCCV today.   No complaints. Denies CP, dyspnea and palpitations.   Objective:   Weight Range: 75.1 kg Body mass index is 25.18 kg/m.   Vital Signs:   Temp:  [97.4 F (36.3 C)-99.1 F (37.3 C)] 99.1 F (37.3 C) (11/09 0432) Pulse Rate:  [62-74] 64 (11/09 0432) BP: (105-151)/(39-96) 137/48 (11/09 0432) SpO2:  [92 %-99 %] 94 % (11/09 0432) Weight:  [75.1 kg] 75.1 kg (11/09 0432) Last BM Date: 10/14/19  Weight change: Filed Weights   10/14/19 0450 10/15/19 0442 10/16/19 0432  Weight: 78 kg 75.2 kg 75.1 kg    Intake/Output:   Intake/Output Summary (Last 24 hours) at 10/16/2019 0708 Last data filed at 10/16/2019 0026 Gross per 24 hour  Intake 1216.05 ml  Output 2150 ml  Net -933.95 ml      Physical Exam    CVP 8 PHYSICAL EXAM: General:  Well appearin elderly WM. No respiratory difficulty HEENT: normal Neck: supple. no JVD. Carotids 2+ bilat; no bruits. No lymphadenopathy or thyromegaly appreciated. Cor: irregular rhythm, regular rate, crisp  mechanical valve sounds. No rubs, gallops or murmurs. Lungs: clear Abdomen: soft, nontender, nondistended. No hepatosplenomegaly. No bruits or masses. Good bowel sounds. Extremities: no cyanosis, clubbing, rash, edema, bilateral unna boots present Neuro: alert & oriented x 3, cranial nerves grossly intact. moves all 4 extremities w/o difficulty. Affect pleasant.   Telemetry   A Flutter w/ CVR in the 60s (personally reviewed)  EKG    N/a   Labs    CBC Recent Labs    10/15/19 0604 10/16/19 0500  WBC 8.7 7.3  HGB 9.5* 9.4*  HCT 30.8* 31.1*  MCV 77.4* 78.9*  PLT 286 062   Basic Metabolic Panel Recent Labs    10/15/19 0604 10/16/19 0500  NA 131* 134*  K 3.4* 3.6  CL 84* 91*  CO2 34* 34*  GLUCOSE 129* 100*  BUN 26* 19  CREATININE 1.44* 1.15  CALCIUM 9.3 8.9   Liver Function Tests No results for input(s): AST, ALT, ALKPHOS, BILITOT, PROT, ALBUMIN in the last 72 hours. No results for input(s): LIPASE, AMYLASE in the last 72 hours. Cardiac Enzymes No results for input(s): CKTOTAL, CKMB, CKMBINDEX, TROPONINI in the last 72 hours.  BNP: BNP (last 3 results) Recent Labs    09/16/19 1008 10/06/19 0958  BNP 676.4* 899.1*    ProBNP (last 3 results) Recent Labs    10/02/19 1131  PROBNP 3,116*     D-Dimer No results for input(s): DDIMER in the last 72 hours. Hemoglobin A1C No results  for input(s): HGBA1C in the last 72 hours. Fasting Lipid Panel No results for input(s): CHOL, HDL, LDLCALC, TRIG, CHOLHDL, LDLDIRECT in the last 72 hours. Thyroid Function Tests No results for input(s): TSH, T4TOTAL, T3FREE, THYROIDAB in the last 72 hours.  Invalid input(s): FREET3  Other results:   Imaging    No results found.   Medications:     Scheduled Medications: . aspirin EC  81 mg Oral Daily  . atorvastatin  80 mg Oral Daily  . Chlorhexidine Gluconate Cloth  6 each Topical Daily  . clopidogrel  75 mg Oral Daily  . digoxin  0.125 mg Oral Daily  .  furosemide  40 mg Oral Daily  . insulin aspart  0-15 Units Subcutaneous TID WC  . insulin aspart  0-5 Units Subcutaneous QHS  . insulin aspart  4 Units Subcutaneous TID WC  . insulin glargine  18 Units Subcutaneous Daily  . pantoprazole  40 mg Oral Daily  . sodium chloride flush  10-40 mL Intracatheter Q12H  . sodium chloride flush  3 mL Intravenous Once  . sodium chloride flush  3 mL Intravenous Q12H  . spironolactone  12.5 mg Oral Daily  . Warfarin - Pharmacist Dosing Inpatient   Does not apply q1800    Infusions: . sodium chloride Stopped (10/13/19 0801)  . sodium chloride    . sodium chloride 20 mL/hr at 10/15/19 1350  . amiodarone 30 mg/hr (10/15/19 1448)    PRN Medications: sodium chloride, acetaminophen, nitroGLYCERIN, ondansetron (ZOFRAN) IV, sodium chloride flush, sodium chloride flush    Assessment/Plan   1. Acute systolic CHF: Ischemic cardiomyopathy.  Echo this admission with EF 30-35% and wall motion abnormalities, prior echo earlier this month showed EF 50-55%.  Cath 10/20 showed jeopardized ramus and LCx territory (99% distal left main, occluded LAD, patent LIMA-LAD).  Concerned that ischemia in this territory has triggered CHF.  Echo showed lateral wall hypokinesis.  With elevated LFTs and creatinine at admission, there was initial concern for low output/cardiogenic shock physiology but initial co-ox was 67%.  Digoxin added 11/2. Co-ox dropped to 41% on 11/4. Started on 0.25 of milrinone 11/4 for low output and to help w/ diuresis. Milrinone and lasix gtt discontinued 11/8 - Co-ox 61% off of milrinone  - On PO lasix 40 mg daily, -2L out yesterday. SCr and K stable. Wt 165 lb  - Continue Spiro and digoxin  - Continue to hold losartan and carvedilol with episode of hypotension on 11/6. Given profound drop in pressure with losartan would be reluctant to restart for now.  2. CAD: Complicated disease, coronary angiography 10/20 showed patent SVG-RCA territory and patent  LIMA-LAD.  The proximal LAD was occluded and there was 99% distal left main stenosis.  This left the ramus and LCx in jeopardy.  He was not a good candidate for protected PCI => cannot place Impella with mechanical aortic valve and peripheral vascular disease likely precludes a femoral IABP.  It was decided to try to manage him medically.  However, he returned with progressive chest pain episodes and NSTEMI, hs-TnI up to 935.  S/p successful orbital atherectomy, PTCA, and stenting of the left main with a 4.0x15 mm Resolute Onyx DES 11/3. Denies CP.  - Plan triple therapy w/ ASA, Plavix + Coumadin x 30 days. Stop ASA after 1 month. Plavix for at least 6 months if tolerated.  - PPI for GI protection, Protonix 40  - Continue atorvastatin 80 daily. LDL goal <70.   3. Mechanical aortic  valve: Stable on echo this admission. Crisp mechanical sounds on exam - Continue ASA 81.  - INR 2.3 today. Off heparin. Continue warfarin and will aim for INR 2.5-3.  4. NSVT:amiodarone gtt started. Improved. This was likely due to ischemia. Had sinus pauses x 2 during sleep 11/2, up to 3.2 sec. No recurrence.  - Continue amiodarone gtt pending DC-CV today - Replete K and Mg as needed to keep K >4.0 and Mg >2.0.  5. H/o GI bleeding: no overt bleeding (normal-appearing BM since admit).  INR 8 initially, now improved after dose of vit k. INR 2.3. Hgb stable today at 8.6. Requiring triple therapy x 30 days for newly placed LM DES + Afib and mechanical AV. He has had feraheme.  No overt bleeding.  - Continue PPI for GI protection. 6. PAD: Patient with aorto-bifemoral bypass, h/o iliac stenting, h/o femoral endarterectomy, PCI to right SFA in 2017.  7. H/o renal artery stenosis.  8. AKI: Resolved. Scr 1.15 today  Follow creatinine closely.   9. Elevated LFTs: Shock liver versus congestive hepatopathy.  LFTs have been trending down. Do not think that amiodarone is the cause (elevated LFTs prior to amiodarone).  - stable 10. DM2:  Improved. CBGs in the 100s. Cover with sliding scale + Lantis. Carb modified diet 11. Atrial fibrillation/flutter: Atrial fibrillation/flutter is new. Rate controlled in 60s. INR therapeutic on warfarin.   - TEE-DCCV scheduled today  at 12.  - Continue IV amio w/ plans to transition to PO -off  blocker due to hypotension   Lyda Jester PA-C 10/16/2019 7:08 AM   Patient seen with PA, agree with the above note.   Patient is doing well today, CVP 8 and co-ox 60%.  No dyspnea. Off milrinone.   TEE-guided DCCV today.  EF appears to be 40-45% with normal RV.  Mechanical AoV ok.  Successful DCCV to NSR.   General: NAD Neck: No JVD, no thyromegaly or thyroid nodule.  Lungs: Clear to auscultation bilaterally with normal respiratory effort. CV: Nondisplaced PMI.  Heart regular S1/S2, no S3/S4, 1/6 SEM RUSB.  No peripheral edema.   Abdomen: Soft, nontender, no hepatosplenomegaly, no distention.  Skin: Intact without lesions or rashes.  Neurologic: Alert and oriented x 3.  Psych: Normal affect. Extremities: No clubbing or cyanosis.  HEENT: Normal.   Continue digoxin, spironolactone, po Lasix.  I will not start him on ARB/ACEi/ARNI with AKI after starting low dose losartan.   Stop amiodarone gtt, transition to po now hat he is in NSR.  Consider atrial fibrillation ablation down the road ([facility]).   Probably home tomorrow.   Loralie Champagne 10/16/2019 1:03 PM

## 2019-10-16 NOTE — Op Note (Signed)
Procedure: TEE  Sedation: Per anesthesiology.   Indication: Atrial fibrillation  Findings: Please see echo section for full report.  Normal LV size with mild LV hypertrophy.  EF 40-45%, diffuse hypokinesis.  Normal RV size and systolic function.  Mild left atrial enlargement, no LA appendage thrombus.  Normal right atrium.  No ASD/PFO by color doppler.  Trivial mitral regurgitation.  Trivial tricuspid regurgitation.  Mechanical aortic valve with no significant regurgitation or stenosis, mean gradient 9 mmHg.  Normal caliber thoracic aorta with grade 3 plaque noted.    May proceed to DCCV.    Loralie Champagne 10/16/2019 12:52 PM

## 2019-10-16 NOTE — Anesthesia Postprocedure Evaluation (Signed)
Anesthesia Post Note  Patient: [name] [name]  Procedure(s) Performed: TRANSESOPHAGEAL ECHOCARDIOGRAM (TEE) (N/A ) CARDIOVERSION (N/A )     Patient location during evaluation: Endoscopy Anesthesia Type: General Level of consciousness: awake Pain management: pain level controlled Respiratory status: spontaneous breathing Cardiovascular status: stable Postop Assessment: no apparent nausea or vomiting Anesthetic complications: no    Last Vitals:  Vitals:   10/16/19 1405 10/16/19 1515  BP: 98/86 99/72  Pulse: 63   Resp: 18   Temp: 36.8 C   SpO2: 99%     Last Pain:  Vitals:   10/16/19 1405  TempSrc: Oral  PainSc:                  Nahmir Zeidman

## 2019-10-16 NOTE — Progress Notes (Signed)
CARDIAC REHAB PHASE I   PRE:  Rate/Rhythm: 66 afib    BP: sitting 122/102    SaO2: 91 RA  MODE:  Ambulation: 400 ft   POST:  Rate/Rhythm: 79 afib    BP: sitting 128/35     SaO2: 94 RA  Pt eager to walk (did not get to walk yesterday). Able to walk without major assist or AD. Legs do get fatigued with distance and he begins to sway and be wobbly. ? If he needs RW for home to increase stamina and security. Gave walking guidelines to increase slowly. He is eager to do CRPII.  [place], ACSM 10/16/2019 9:19 AM

## 2019-10-16 NOTE — Anesthesia Preprocedure Evaluation (Addendum)
Anesthesia Evaluation  Patient identified by MRN, date of birth, ID band Patient awake    Reviewed: Allergy & Precautions, NPO status , Patient's Chart, lab work & pertinent test results  Airway Mallampati: II  TM Distance: >3 FB     Dental   Pulmonary Current Smoker,    breath sounds clear to auscultation       Cardiovascular hypertension, + angina + CAD, + Past MI and + Peripheral Vascular Disease  + Valvular Problems/Murmurs  Rhythm:Irregular Rate:Normal     Neuro/Psych TIACVA    GI/Hepatic negative GI ROS, Neg liver ROS,   Endo/Other  diabetes  Renal/GU negative Renal ROS     Musculoskeletal   Abdominal   Peds  Hematology  (+) anemia ,   Anesthesia Other Findings   Reproductive/Obstetrics                            Anesthesia Physical Anesthesia Plan  ASA: III  Anesthesia Plan: General   Post-op Pain Management:    Induction: Intravenous  PONV Risk Score and Plan: 1 and Midazolam  Airway Management Planned: Nasal Cannula, Simple Face Mask and Mask  Additional Equipment:   Intra-op Plan:   Post-operative Plan:   Informed Consent: I have reviewed the patients History and Physical, chart, labs and discussed the procedure including the risks, benefits and alternatives for the proposed anesthesia with the patient or authorized representative who has indicated his/her understanding and acceptance.     Dental advisory given  Plan Discussed with: CRNA and Anesthesiologist  Anesthesia Plan Comments:         Anesthesia Quick Evaluation

## 2019-10-17 ENCOUNTER — Encounter (HOSPITAL_COMMUNITY): Payer: Self-pay | Admitting: Cardiology

## 2019-10-17 ENCOUNTER — Other Ambulatory Visit: Payer: Self-pay | Admitting: *Deleted

## 2019-10-17 LAB — BASIC METABOLIC PANEL
Anion gap: 10 (ref 5–15)
BUN: 20 mg/dL (ref 8–23)
CO2: 32 mmol/L (ref 22–32)
Calcium: 8.8 mg/dL — ABNORMAL LOW (ref 8.9–10.3)
Chloride: 94 mmol/L — ABNORMAL LOW (ref 98–111)
Creatinine, Ser: 1.18 mg/dL (ref 0.61–1.24)
GFR calc Af Amer: >60 mL/min (ref >60)
GFR calc non Af Amer: >60 mL/min (ref >60)
Glucose, Bld: 52 mg/dL — ABNORMAL LOW (ref 70–99)
Potassium: 3.1 mmol/L — ABNORMAL LOW (ref 3.5–5.1)
Sodium: 136 mmol/L (ref 135–145)

## 2019-10-17 LAB — COOXEMETRY PANEL
Carboxyhemoglobin: 2 % — ABNORMAL HIGH (ref 0.5–1.5)
Methemoglobin: 1 % (ref 0.0–1.5)
O2 Saturation: 88.5 %
Total hemoglobin: 9.1 g/dL — ABNORMAL LOW (ref 12.0–16.0)

## 2019-10-17 LAB — GLUCOSE, CAPILLARY
Glucose-Capillary: 133 mg/dL — ABNORMAL HIGH (ref 70–99)
Glucose-Capillary: 140 mg/dL — ABNORMAL HIGH (ref 70–99)
Glucose-Capillary: 188 mg/dL — ABNORMAL HIGH (ref 70–99)
Glucose-Capillary: 384 mg/dL — ABNORMAL HIGH (ref 70–99)
Glucose-Capillary: 54 mg/dL — ABNORMAL LOW (ref 70–99)

## 2019-10-17 LAB — PROTIME-INR
INR: 2.3 — ABNORMAL HIGH (ref 0.8–1.2)
Prothrombin Time: 24.9 seconds — ABNORMAL HIGH (ref 11.4–15.2)

## 2019-10-17 MED ORDER — DIGOXIN 125 MCG PO TABS: 0.1250 mg | ORAL_TABLET | Freq: Every day | ORAL | 6 refills | Status: DC

## 2019-10-17 MED ORDER — SPIRONOLACTONE 25 MG PO TABS: 25.0000 mg | ORAL_TABLET | Freq: Every day | ORAL | Status: DC

## 2019-10-17 MED ORDER — FUROSEMIDE 20 MG PO TABS: 40.0000 mg | ORAL_TABLET | Freq: Two times a day (BID) | ORAL | 3 refills | Status: DC

## 2019-10-17 MED ORDER — SPIRONOLACTONE 25 MG PO TABS: 25.0000 mg | ORAL_TABLET | Freq: Every day | ORAL | 6 refills | Status: DC

## 2019-10-17 MED ORDER — AMIODARONE HCL 200 MG PO TABS: ORAL_TABLET | ORAL | 6 refills | Status: DC

## 2019-10-17 MED ORDER — ASPIRIN 81 MG PO TBEC: 81.0000 mg | DELAYED_RELEASE_TABLET | Freq: Every day | ORAL | 0 refills | Status: DC

## 2019-10-17 MED ORDER — CARVEDILOL 3.125 MG PO TABS: 3.1250 mg | ORAL_TABLET | Freq: Two times a day (BID) | ORAL | Status: DC

## 2019-10-17 MED ORDER — POTASSIUM CHLORIDE CRYS ER 20 MEQ PO TBCR: 20.0000 meq | EXTENDED_RELEASE_TABLET | Freq: Every day | ORAL | 6 refills | Status: DC

## 2019-10-17 MED ORDER — CLOPIDOGREL BISULFATE 75 MG PO TABS: 75.0000 mg | ORAL_TABLET | Freq: Every day | ORAL | 6 refills | Status: DC

## 2019-10-17 MED ORDER — POTASSIUM CHLORIDE CRYS ER 20 MEQ PO TBCR
40.0000 meq | EXTENDED_RELEASE_TABLET | Freq: Once | ORAL | Status: AC
  Administered 2019-10-17: 40 meq via ORAL
  Filled 2019-10-17: qty 2

## 2019-10-17 MED ORDER — AMIODARONE HCL 200 MG PO TABS
200.0000 mg | ORAL_TABLET | Freq: Two times a day (BID) | ORAL | Status: DC
  Administered 2019-10-17: 200 mg via ORAL
  Filled 2019-10-17: qty 1

## 2019-10-17 NOTE — Progress Notes (Signed)
Hypoglycemic Event  CBG: 54  Treatment: 8 oz juice/soda  Symptoms: None  Follow-up CBG: [id number] CBG Result:140  Possible Reasons for Event: medication regimen, unknown  Comments/MD notified:per protocol    Giovana Faciane L

## 2019-10-17 NOTE — Progress Notes (Addendum)
Patient ID: [name] [name], male   DOB: [birth year], 73 y.o.   MRN: [id number]     Advanced Heart Failure Rounding Note  PCP-Cardiologist: Jenkins Rouge, MD   Subjective:    S/p successful orbital atherectomy, PTCA, and stenting of protected left main with a 4.0x15 mm Resolute Onyx DES 11/3. LIMA-LAD patent. SVG-PDA patent.  2DEcho: EF 30-35%, mechanical AoV ok. TEE 11/9 w/ improved EF, 40-45%.  Markedly volume overloaded initially w/ CVP of 20. Initial poor response to diuretics. Started on Lasix gtt, 12 mg/hr + metolazone. Milrinone begun with low co-ox 44%.  Hospital course c/b recurrent hypotension and hyperkalemia. Received Lokelma. Losartan, spiro and carvedilol held>>spiro has since been resumed.   Lasix gtt and milrinone discontinued 11/8. Co-ox 88% today. On PO lasix, 40 daily. Overall net negative 17L since admit. Scr WNL at 1.18. K 3.1.     S/p TEE/DCCV 11/9 for afib. Remains in NSR. HR in the 60s.   Feels well. No cardiac complaints. Ready to go home.   Objective:   Weight Range: 75.7 kg Body mass index is 25.36 kg/m.   Vital Signs:   Temp:  [97.8 F (36.6 C)-98.4 F (36.9 C)] 98 F (36.7 C) (11/10 0511) Pulse Rate:  [50-66] 64 (11/10 0511) Resp:  [14-18] 16 (11/10 0511) BP: (98-158)/(17-102) 128/24 (11/10 0511) SpO2:  [96 %-100 %] 97 % (11/10 0511) Weight:  [75.7 kg] 75.7 kg (11/10 0511) Last BM Date: 10/16/19  Weight change: Filed Weights   10/15/19 0442 10/16/19 0432 10/17/19 0511  Weight: 75.2 kg 75.1 kg 75.7 kg    Intake/Output:   Intake/Output Summary (Last 24 hours) at 10/17/2019 0707 Last data filed at 10/17/2019 0600 Gross per 24 hour  Intake 640.04 ml  Output 950 ml  Net -309.96 ml      Physical Exam    PHYSICAL EXAM: General:  Well appearing. No respiratory difficulty HEENT: normal Neck: supple. no JVD. Carotids 2+ bilat; no bruits. No lymphadenopathy or thyromegaly appreciated. Cor: PMI nondisplaced. Regular rate & rhythm. Crisp  mechanica valve sounds.  Lungs: clear Abdomen: soft, nontender, nondistended. No hepatosplenomegaly. No bruits or masses. Good bowel sounds. Extremities: no cyanosis, clubbing, rash, edema Neuro: alert & oriented x 3, cranial nerves grossly intact. moves all 4 extremities w/o difficulty. Affect pleasant.    Telemetry   NSR 60s   EKG    N/a   Labs    CBC Recent Labs    10/15/19 0604 10/16/19 0500  WBC 8.7 7.3  HGB 9.5* 9.4*  HCT 30.8* 31.1*  MCV 77.4* 78.9*  PLT 286 858   Basic Metabolic Panel Recent Labs    10/16/19 0500 10/17/19 0453  NA 134* 136  K 3.6 3.1*  CL 91* 94*  CO2 34* 32  GLUCOSE 100* 52*  BUN 19 20  CREATININE 1.15 1.18  CALCIUM 8.9 8.8*   Liver Function Tests No results for input(s): AST, ALT, ALKPHOS, BILITOT, PROT, ALBUMIN in the last 72 hours. No results for input(s): LIPASE, AMYLASE in the last 72 hours. Cardiac Enzymes No results for input(s): CKTOTAL, CKMB, CKMBINDEX, TROPONINI in the last 72 hours.  BNP: BNP (last 3 results) Recent Labs    09/16/19 1008 10/06/19 0958  BNP 676.4* 899.1*    ProBNP (last 3 results) Recent Labs    10/02/19 1131  PROBNP 3,116*     D-Dimer No results for input(s): DDIMER in the last 72 hours. Hemoglobin A1C No results for input(s): HGBA1C in the last 72 hours. Fasting  Lipid Panel No results for input(s): CHOL, HDL, LDLCALC, TRIG, CHOLHDL, LDLDIRECT in the last 72 hours. Thyroid Function Tests No results for input(s): TSH, T4TOTAL, T3FREE, THYROIDAB in the last 72 hours.  Invalid input(s): FREET3  Other results:   Imaging    No results found.   Medications:     Scheduled Medications: . aspirin EC  81 mg Oral Daily  . atorvastatin  80 mg Oral Daily  . Chlorhexidine Gluconate Cloth  6 each Topical Daily  . clopidogrel  75 mg Oral Daily  . digoxin  0.125 mg Oral Daily  . furosemide  40 mg Oral Daily  . insulin aspart  0-15 Units Subcutaneous TID WC  . insulin aspart  0-5 Units  Subcutaneous QHS  . insulin aspart  4 Units Subcutaneous TID WC  . insulin glargine  18 Units Subcutaneous Daily  . pantoprazole  40 mg Oral Daily  . sodium chloride flush  10-40 mL Intracatheter Q12H  . sodium chloride flush  3 mL Intravenous Once  . sodium chloride flush  3 mL Intravenous Q12H  . spironolactone  12.5 mg Oral Daily  . Warfarin - Pharmacist Dosing Inpatient   Does not apply q1800    Infusions: . sodium chloride Stopped (10/13/19 0801)  . sodium chloride    . amiodarone 30 mg/hr (10/17/19 0319)    PRN Medications: sodium chloride, acetaminophen, nitroGLYCERIN, ondansetron (ZOFRAN) IV, sodium chloride flush, sodium chloride flush    Assessment/Plan   1. Acute systolic CHF: Ischemic cardiomyopathy.  Echo this admission with EF 30-35% and wall motion abnormalities, prior echo earlier this month showed EF 50-55%.  Cath 10/20 showed jeopardized ramus and LCx territory (99% distal left main, occluded LAD, patent LIMA-LAD).  Concerned that ischemia in this territory has triggered CHF.  Echo showed lateral wall hypokinesis.  With elevated LFTs and creatinine at admission, there was initial concern for low output/cardiogenic shock physiology but initial co-ox was 67%.  Digoxin added 11/2. Co-ox dropped to 41% on 11/4. Started on 0.25 of milrinone 11/4 for low output and to help w/ diuresis. Milrinone and lasix gtt discontinued 11/8. TEE at time of DCCV 11/9 showed improved EF at 40-45%. - Co-ox 88% today - On PO lasix 40 mg daily, euvolemic on exam. Wt 166 lb. SCr and K stable. - Continue Spiro and digoxin  - Continue to hold losartan and carvedilol with episode of hypotension on 11/6. Given profound drop in pressure with losartan would be reluctant to restart for now.  2. CAD: Complicated disease, coronary angiography 10/20 showed patent SVG-RCA territory and patent LIMA-LAD.  The proximal LAD was occluded and there was 99% distal left main stenosis.  This left the ramus and LCx  in jeopardy.  He was not a good candidate for protected PCI => cannot place Impella with mechanical aortic valve and peripheral vascular disease likely precludes a femoral IABP.  It was decided to try to manage him medically.  However, he returned with progressive chest pain episodes and NSTEMI, hs-TnI up to 935.  S/p successful orbital atherectomy, PTCA, and stenting of the left main with a 4.0x15 mm Resolute Onyx DES 11/3. Denies CP.  - Plan triple therapy w/ ASA, Plavix + Coumadin x 30 days. Stop ASA after 1 month. Plavix for at least 6 months if tolerated.  - PPI for GI protection, Protonix 40  - Continue atorvastatin 80 daily. LDL goal <70.   3. Mechanical aortic valve: Stable on echo this admission. Crisp mechanical sounds on exam -  Continue ASA 81.  - INR 2.3 today. Off heparin. Continue warfarin and will aim for INR 2.5-3.  4. NSVT:amiodarone gtt started. Improved. This was likely due to ischemia. Had sinus pauses x 2 during sleep 11/2, up to 3.2 sec. No recurrence.  - Continue PO amiodarone on discharge - Replete K and Mg as needed to keep K >4.0 and Mg >2.0.  5. H/o GI bleeding: no overt bleeding (normal-appearing BM since admit).  INR 8 initially, now improved after dose of vit k. INR 2.3. Hgb stabilized, 9.4. Requiring triple therapy x 30 days for newly placed LM DES + Afib and mechanical AV. He has had feraheme.  No overt bleeding.  - Continue PPI for GI protection. 6. PAD: Patient with aorto-bifemoral bypass, h/o iliac stenting, h/o femoral endarterectomy, PCI to right SFA in 2017.  7. H/o renal artery stenosis.  8. AKI: Resolved. Scr 1.18 today    9. Elevated LFTs: Shock liver versus congestive hepatopathy.  LFTs have been trending down. Do not think that amiodarone is the cause (elevated LFTs prior to amiodarone).  - stable 10. DM2: Cover with sliding scale + Lantis. Carb modified diet 11. Atrial fibrillation/flutter: Atrial fibrillation/flutter is new. S/p successful TEE-DCCV 11/9.  Remains in NSR. HR 60s.  - Continue PO amiodarone at discharge, 200 mg bid x 1 week>>wean to 200 mg once daily -off  blocker due to hypotension  -Continue coumadin.  -Consider atrial fibrillation ablation down the road ([facility]).  12. Hypokalemia: K 3.1.  -Supp w/ Kdur 40 mEq x 1 -f/u BMP at hospital f/u appt  Dispo: likely home today after MD assessment Will arrange [facility] f/u and coumadin f/u.    Lyda Jester PA-C 10/17/2019 7:07 AM   Patient seen with PA, agree with the above note.   He feels good this morning. Creatinine stable, weight down 10 lbs total.  Good co-ox off milrinone.  He is in NSR this morning after DCCV, rate in 50s.  He is on po amiodarone.   TEE yesterday with EF improved somewhat to 40-45%, normal RV, normal mechanical aortic valve.   I think that he is ready for home today.  He will need followup in 10-14 days with me in CHF clinic.  He will need coumadin clinic followup.  Meds for discharge: Plavix 75 mg daily, ASA 81 daily to complete 1 month post-PCI then stop, warfarin per coumadin clinic with goal INR 2.5-3 while on Plavix, spironolactone 25 mg daily, digoxin 0.125 daily, atorvastatin 80 daily, Lasix 40 daily, KCl 20 daily, amiodarone 200 mg bid x 1 week then 200 mg daily.   Loralie Champagne 10/17/2019 11:04 AM

## 2019-10-17 NOTE — Discharge Summary (Addendum)
Advanced Heart Failure Team  Discharge Summary   Patient ID: [name] [name] MRN: [id number], DOB/AGE: [birth year] 74 y.o. Admit date: 10/06/2019 D/C date:     10/17/2019   Primary Discharge Diagnoses:  1. Acute systolic CHF: Ischemic cardiomyopathy 2. CAD: Complicated disease, coronary angiography 10/20 showed patent SVG-RCA territory and patent LIMA-LAD.  The proximal LAD was occluded and there was 99% distal left main stenosis.  This left the ramus and LCx in jeopardy.  S/p successful orbital atherectomy, PTCA, and stenting of the left main with a 4.0x15 mm Resolute Onyx DES 11/3. Denies CP.  - Plan triple therapy w/ ASA, Plavix + Coumadin x 30 days. Stop ASA after 1 month. Plavix for at least 6 months if tolerated.  3. Mechanical aortic valve:  4. NSVT 5. H/o GI bleeding 6. PAD 7. H/o renal artery stenosis.  8. AKI 9. Elevated LFTs 10. DM2: 22. Atrial fibrillation/flutter:  S/p successful TEE-DCCV 11/9.  Hospital Course:  [name] [name] is a 73 y.o. male with a history of CAD s/p CABG, mechanical AVR on coumadin, extensive PVD, DM, HTN, HLD, ITP, TIA, and recurrent GI bleed. Note, requires lovenox bridging off heparin.   Admitted with chest pain and dyspnea. No BB or ARB at the time of discharge due to hypotension. See below for detailed problem list. He will be followed closely in the HF clinic. Plan to check EKG, CMET, CBC at that visit.  He was set up for follow up in the coumadin clinic.   1. Acute systolic CHF: Ischemic cardiomyopathy.  Echo this admission with EF 30-35% and wall motion abnormalities, prior echo earlier this month showed EF 50-55%.  Cath 10/20 showed jeopardized ramus and LCx territory (99% distal left main, occluded LAD, patent LIMA-LAD).  Concerned that ischemia in this territory has triggered CHF.  Echo showed lateral wall hypokinesis.  With elevated LFTs and creatinine at admission, there was initial concern for low output/cardiogenic shock  physiology but initial co-ox was 67%.  Digoxin added 11/2. Co-ox dropped to 41% on 11/4. Started on 0.25 of milrinone 11/4 for low output and to help w/ diuresis. Milrinone and lasix gtt discontinued 11/8. Diuresed 12 pounds. TEE at time of DCCV 11/9 showed improved EF at 40-45%. - On PO lasix 40 mg daily, euvolemic on exam.  - Continue Spiro and digoxin  - Continue to hold losartan and carvedilol with episode of hypotension on 11/6. Given profound drop in pressure with losartan would be reluctant to restart for now.  2. CAD: Complicated disease, coronary angiography 10/20 showed patent SVG-RCA territory and patent LIMA-LAD.  The proximal LAD was occluded and there was 99% distal left main stenosis.  This left the ramus and LCx in jeopardy.  He was not a good candidate for protected PCI => cannot place Impella with mechanical aortic valve and peripheral vascular disease likely precludes a femoral IABP.  It was decided to try to manage him medically.  However, he returned with progressive chest pain episodes and NSTEMI, hs-TnI up to 935.  S/p successful orbital atherectomy, PTCA, and stenting of the left main with a 4.0x15 mm Resolute Onyx DES 11/3. Denies CP.  - Plan triple therapy w/ ASA, Plavix + Coumadin x 30 days. Stop ASA after 1 month ( 11/16/19). Plavix for at least 6 months if tolerated.  - PPI for GI protection, Protonix 40  - Continue atorvastatin 80 daily. LDL goal <70.   3. Mechanical aortic valve: Stable on echo this admission. Crisp mechanical sounds  on exam - Continue ASA 81.  - INR 2.3 today. Off heparin. Continue warfarin and will aim for INR 2.5-3. He will follow up in the Coumadin Clinic on Friday.  4. NSVT:amiodarone gtt started. Improved. This was likely due to ischemia. Had sinus pauses x 2 during sleep 11/2, up to 3.2 sec. No recurrence.  - Continue PO amiodarone on discharge with taper in one week.  - Replete K and Mg as needed to keep K >4.0 and Mg >2.0.  5. H/o GI bleeding: no  overt bleeding (normal-appearing BM since admit).  INR 8 initially, now improved after dose of vit k. INR 2.3. Hgb stabilized, 9.4. Requiring triple therapy x 30 days for newly placed LM DES + Afib and mechanical AV. He has had feraheme.  No overt bleeding.  - Continue PPI for GI protection. 6. PAD: Patient with aorto-bifemoral bypass, h/o iliac stenting, h/o femoral endarterectomy, PCI to right SFA in 2017.  7. H/o renal artery stenosis.  8. AKI: Resolved. Scr 1.18 today    9. Elevated LFTs: Shock liver versus congestive hepatopathy.  LFTs have been trending down. Do not think that amiodarone is the cause (elevated LFTs prior to amiodarone).  - stable 10. DM2: Covered with sliding scale + Lantus. Carb modified diet. Restarted home regimen at discharge.  11. Atrial fibrillation/flutter: Atrial fibrillation/flutter is new. S/p successful TEE-DCCV 11/9. Remains in NSR. HR 60s.  - Continue PO amiodarone at discharge, 200 mg bid x 1 week>>wean to 200 mg once daily -off ? blocker due to hypotension  -Continue coumadin.  -Consider atrial fibrillation ablation down the road ([facility]-HF).  12. Hypokalemia:  -K supplemented.   Discharge Weight: 166.8pounds  Discharge Vitals: Blood pressure (!) 147/30, pulse (!) 58, temperature 97.7 F (36.5 C), temperature source Oral, resp. rate 17, height 5\' 8"  (1.727 m), weight 75.7 kg, SpO2 98 %.  Labs: Lab Results  Component Value Date   WBC 7.3 10/16/2019   HGB 9.4 (L) 10/16/2019   HCT 31.1 (L) 10/16/2019   MCV 78.9 (L) 10/16/2019   PLT 291 10/16/2019    Recent Labs  Lab 10/11/19 0441  10/17/19 0453  NA 133*   < > 136  K 3.8   < > 3.1*  CL 102   < > 94*  CO2 22   < > 32  BUN 25*   < > 20  CREATININE 1.09   < > 1.18  CALCIUM 7.5*   < > 8.8*  PROT 4.7*  --   --   BILITOT 0.8  --   --   ALKPHOS 73  --   --   ALT 316*  --   --   AST 67*  --   --   GLUCOSE 373*   < > 52*   < > = values in this interval not displayed.   Lab Results  Component  Value Date   CHOL 74 10/07/2019   HDL 45 10/07/2019   LDLCALC 20 10/07/2019   TRIG 46 10/07/2019   BNP (last 3 results) Recent Labs    09/16/19 1008 10/06/19 0958  BNP 676.4* 899.1*    ProBNP (last 3 results) Recent Labs    10/02/19 1131  PROBNP 3,116*     Diagnostic Studies/Procedures   No results found.  Discharge Medications   Allergies as of 10/17/2019   No Known Allergies     Medication List    STOP taking these medications   amLODipine 10 MG tablet Commonly known as:  NORVASC   enoxaparin 80 MG/0.8ML injection Commonly known as: LOVENOX   isosorbide mononitrate 30 MG 24 hr tablet Commonly known as: IMDUR   metoprolol succinate 100 MG 24 hr tablet Commonly known as: TOPROL-XL   trandolapril 1 MG tablet Commonly known as: MAVIK     TAKE these medications   amiodarone 200 MG tablet Commonly known as: PACERONE 200 mg twice a day for 7 days then 200 mg daily   aspirin 81 MG EC tablet Take 1 tablet (81 mg total) by mouth daily. What changed:   how much to take  when to take this  reasons to take this   atorvastatin 80 MG tablet Commonly known as: LIPITOR Take 80 mg by mouth daily.   canagliflozin 300 MG Tabs tablet Commonly known as: INVOKANA Take 300 mg by mouth daily.   clopidogrel 75 MG tablet Commonly known as: PLAVIX Take 1 tablet (75 mg total) by mouth daily. Start taking on: October 18, 2019   digoxin 0.125 MG tablet Commonly known as: LANOXIN Take 1 tablet (0.125 mg total) by mouth daily. Start taking on: October 18, 2019   furosemide 20 MG tablet Commonly known as: LASIX Take 2 tablets (40 mg total) by mouth 2 (two) times daily. What changed: how much to take   glyBURIDE 2.5 MG tablet Commonly known as: DIABETA Take 5 mg by mouth 2 (two) times daily with a meal.   nitroGLYCERIN 0.4 MG SL tablet Commonly known as: NITROSTAT Place 1 tablet (0.4 mg total) under the tongue every 5 (five) minutes x 3 doses as needed  for chest pain.   pantoprazole 40 MG tablet Commonly known as: PROTONIX Take 40 mg by mouth daily.   potassium chloride SA 20 MEQ tablet Commonly known as: KLOR-CON Take 1 tablet (20 mEq total) by mouth daily.   Repatha SureClick 527 MG/ML Soaj Generic drug: Evolocumab Inject 140 mg into the skin every 14 (fourteen) days.   spironolactone 25 MG tablet Commonly known as: ALDACTONE Take 1 tablet (25 mg total) by mouth daily. Start taking on: October 18, 2019   warfarin 10 MG tablet Commonly known as: COUMADIN Take as directed. If you are unsure how to take this medication, talk to your nurse or doctor. Original instructions: Take 5 mg on Mon/Wed/Fri and 10 mg on Tue/Thur/Sat/Sun. What changed:   how much to take  how to take this  when to take this  additional instructions       Disposition   The patient will be discharged in stable condition to home. Discharge Instructions    (HEART FAILURE PATIENTS) Call MD:  Anytime you have any of the following symptoms: 1) 3 pound weight gain in 24 hours or 5 pounds in 1 week 2) shortness of breath, with or without a dry hacking cough 3) swelling in the hands, feet or stomach 4) if you have to sleep on extra pillows at night in order to breathe.   Complete by: As directed    Amb Referral to Cardiac Rehabilitation   Complete by: As directed    Diagnosis:  Coronary Stents PTCA NSTEMI Heart Failure (see criteria below if ordering Phase II)     Heart Failure Type: Chronic Systolic & Diastolic   After initial evaluation and assessments completed: Virtual Based Care may be provided alone or in conjunction with Phase 2 Cardiac Rehab based on patient barriers.: Yes   Diet - low sodium heart healthy   Complete by: As directed    Heart  Failure patients record your daily weight using the same scale at the same time of day   Complete by: As directed    Increase activity slowly   Complete by: As directed      Follow-up Information     [facility] Follow up on 10/26/2019.   Specialty: Cardiology Why: 10:00 AM w/ Dr. Claris Gladden PA/NP. Parking Garage Code is [employer] information: [address] [id number] [place] [phone]       [facility] Office Follow up on 10/20/2019.   Specialty: Cardiology Why: at 9:30  Contact information: [address], [place] [phone]            Duration of Discharge Encounter: Greater than 35 minutes   Signed, Darrick Grinder NP-C  10/17/2019, 1:57 PM

## 2019-10-17 NOTE — Patient Outreach (Signed)
Original referral for Red EMMI flag on 10/02/19, pt hospitalized 10/06/19 and greater than 10 days. Case closed.  Jacqlyn Larsen [facility], [place] Coordinator [phone]

## 2019-10-17 NOTE — Progress Notes (Addendum)
CARDIAC REHAB PHASE I   PRE:  Rate/Rhythm: 66 SR    BP: sitting 126/42    SaO2:   MODE:  Ambulation: 400 ft   POST:  Rate/Rhythm: 75 SR    BP: sitting 132/113, recheck 132/100     SaO2: 93 RA  Pt feeling well. Able to walk independently. A little stronger but still c/o leg fatigue and some SOB with distance. Does not want RW at this time. BP still difficult to register correctly after walk. He has walking guidelines for home to work on. Reviewed low sodium and daily wts. Discussed NTG as he did not know how to take it. He is eager to do CRPII.  [place], ACSM 10/17/2019 8:53 AM

## 2019-10-17 NOTE — Consult Note (Signed)
   [facility] CM Inpatient Consult   10/17/2019  [name] [name] 1945-12-23 [id number]    Patient was screened for 30% extreme high risk score for unplanned readmission with 3 hospitalizations in the past 6 months and a 30 day readmission; as well as to check for needs of [facility] management services for community follow-up as a benefit ofhis Medicare/ [employer] plan.  Review of chart and MD history and physical dated 10/06/19 reveal as: [name] [name] is a 73 y.o. male with a history of CAD s/p CABG, mechanical AVR on coumadin, extensive PVD, DM, HTN, HLD, ITP, TIA, and recurrent GI bleed. Note, requires lovenox bridging off heparin.     presented to [facility] with complaints of chest pain, SOB, LE swelling, and orthopnea. CXR with bilateral pleural effusions suggestive of CHF. He will be followed closely in the HF clinic.  Called and spoke with patient. HIPAA verified.  Patient reportsbeing discharge to home and his daughter from [place] is here for few days to assist him as he recovers. He lives on his own prior to admission. Patient states not having any concerns with medications (self- managed); pharmacy (using [facility]); transports himself (still drives, but has friends to drive if needed). Discussed withpatient regarding [facility] CM serviceswhich areavailable to helpmaintain health/managehealth needs/ concerns andtoprevent readmissions.  Patient expressed interest with theservices hecould get to assisthimwith his care needs, especially that he is new to dealing with HF.Headmits needing help on ways to manage HF which was the main reasonfor hospitalization.  Patient's primary care provider is Dr. Shon Baton with [facility] providing transition of care.  Verbal consentprovided by patientforTHNCMservices. Referral made to [facility] RNcaremanagementcoordinator for complex disease managementneeds ofHFand to assess further needs  postdischarge.   For questionsand additional information,please contact:  Rabecka Brendel A. Yaniah Thiemann, BSN, RN-BC [facility] Liaison Cell: [phone]

## 2019-10-19 ENCOUNTER — Telehealth (HOSPITAL_COMMUNITY): Payer: Self-pay

## 2019-10-19 NOTE — Telephone Encounter (Signed)
Pt insurance is active and benefits verified through Medicare A/B. Co-pay $0.00, DED $198.00/$198.00 met, out of pocket $0.00/$0.00 met, co-insurance 20%. No pre-authorization required. Passport, 10/19/2019 @ 10:08AM, LEZ#[id number]  2ndary insurance is active and benefits verified through [employer]. Co-pay $0.00, DED $0.00/$0.00 met, out of pocket $0.00/$0.00 met, co-insurance 0%. No pre-authorization required. Passport, 10/19/2019 @ 10:12AM, VNR#[id number]  Will contact patient to see if he is interested in the Cardiac Rehab Program. If interested, patient will need to complete follow up appt. Once completed, patient will be contacted for scheduling upon review by the RN Navigator.

## 2019-10-19 NOTE — Telephone Encounter (Signed)
Called patient to see if he is interested in the Cardiac Rehab Program. Patient expressed interest. Explained scheduling process and went over insurance, patient verbalized understanding. Will contact patient for scheduling once f/u has been completed.  °

## 2019-10-20 ENCOUNTER — Other Ambulatory Visit: Payer: Self-pay

## 2019-10-20 ENCOUNTER — Other Ambulatory Visit: Payer: Self-pay | Admitting: *Deleted

## 2019-10-20 ENCOUNTER — Ambulatory Visit (INDEPENDENT_AMBULATORY_CARE_PROVIDER_SITE_OTHER): Payer: Medicare Other | Admitting: *Deleted

## 2019-10-20 DIAGNOSIS — Z5181 Encounter for therapeutic drug level monitoring: Secondary | ICD-10-CM

## 2019-10-20 LAB — POCT INR: INR: 4.4 — AB (ref 2.0–3.0)

## 2019-10-20 NOTE — Patient Outreach (Signed)
[facility]) Care Management  10/20/2019  [name] [name] [birth year] [id number]    Referral received 10/17/2019 Initial Outreach 10/20/2019  RN spoke with pt today and introduced the [facility] program and services. Pt receptive to today's call and able to verify a virtual telephone call with his primary provider on 11/12. States he will have an office visit soon. Pt recovery from his recent discharge with no issues or needs at this time. RN further engaged and inquired on his HF as pt states his daily weights remain the same at 166 lbs and denies any signs of HF. Pt able to verify he is in the GREEN zone and familiar with the HF zones. Other inquires on pt's HTN as pt states he takes his medications as prescribed and does not wish to monitor his BP on a regular based. States taken only at his provider offices. RN attempted to educate pt on HTN and how monitoring this condition would lower the risk of encountering any symptoms. Pt declined and feels this is not necessary. RN also inquired on pt's DM as pt states he will not preform any "finger stick" and just depends on his A1c which is currently 7.1, states pt. RN attempted stress the importance of closer monitoring of his CBG however pt does not wish to monitoring any of his conditions any closer then noted above.  RN offered to follow up via [facility] services around preventive measures or focus on any other his condition for closer monitoring however pt decline. RN offered quarterly monitoring however pt adamant about not wanting to receive calls or enroll into the [facility] program . Feels this is no necessary indicating he was getting irritated. RN apologized and offered to assist with any other issues at this time. Pt again decline as the call was ended.  Based upon pt's decline of services RN will notify the provider and close this case due to pt becoming irritated with attempts to enroll this pt into the [facility] program and services.    Case closed from further contact from this RN case manager.  Raina Mina, RN Care Management Coordinator [place] Office [phone]

## 2019-10-20 NOTE — Patient Instructions (Addendum)
Description   Hold coumadin tomorrow and and take 1/2 a tablet on Sunday, then start taking coumadin 1/2 tablet daily except for 1 tablet on Sunday and Thursdays.  Recheck INR in 1 week. Call coumadin Clinic for any changes in medications or upcoming procedures. [phone]

## 2019-10-24 LAB — GLUCOSE, CAPILLARY: Glucose-Capillary: 236 mg/dL — ABNORMAL HIGH (ref 70–99)

## 2019-10-26 ENCOUNTER — Other Ambulatory Visit: Payer: Self-pay

## 2019-10-26 ENCOUNTER — Ambulatory Visit (HOSPITAL_COMMUNITY)
Admission: RE | Admit: 2019-10-26 | Discharge: 2019-10-26 | Disposition: A | Discharge status: Home / Self Care | Payer: Medicare Other | Source: Ambulatory Visit | Attending: Adult Health | Admitting: Adult Health

## 2019-10-26 ENCOUNTER — Encounter (HOSPITAL_COMMUNITY): Payer: Self-pay

## 2019-10-26 ENCOUNTER — Other Ambulatory Visit (HOSPITAL_COMMUNITY): Payer: Self-pay | Admitting: Cardiology

## 2019-10-26 VITALS — BP 113/62 | HR 61 | Wt 163.0 lb

## 2019-10-26 DIAGNOSIS — Z7901 Long term (current) use of anticoagulants: Secondary | ICD-10-CM | POA: Insufficient documentation

## 2019-10-26 DIAGNOSIS — I5022 Chronic systolic (congestive) heart failure: Secondary | ICD-10-CM | POA: Diagnosis not present

## 2019-10-26 DIAGNOSIS — Z8719 Personal history of other diseases of the digestive system: Secondary | ICD-10-CM | POA: Diagnosis not present

## 2019-10-26 DIAGNOSIS — I739 Peripheral vascular disease, unspecified: Secondary | ICD-10-CM | POA: Diagnosis not present

## 2019-10-26 DIAGNOSIS — I214 Non-ST elevation (NSTEMI) myocardial infarction: Secondary | ICD-10-CM | POA: Insufficient documentation

## 2019-10-26 DIAGNOSIS — I701 Atherosclerosis of renal artery: Secondary | ICD-10-CM | POA: Diagnosis not present

## 2019-10-26 DIAGNOSIS — I255 Ischemic cardiomyopathy: Secondary | ICD-10-CM | POA: Diagnosis not present

## 2019-10-26 DIAGNOSIS — Z7984 Long term (current) use of oral hypoglycemic drugs: Secondary | ICD-10-CM | POA: Diagnosis not present

## 2019-10-26 DIAGNOSIS — F17210 Nicotine dependence, cigarettes, uncomplicated: Secondary | ICD-10-CM | POA: Diagnosis not present

## 2019-10-26 DIAGNOSIS — I110 Hypertensive heart disease with heart failure: Secondary | ICD-10-CM | POA: Diagnosis not present

## 2019-10-26 DIAGNOSIS — E1151 Type 2 diabetes mellitus with diabetic peripheral angiopathy without gangrene: Secondary | ICD-10-CM | POA: Diagnosis not present

## 2019-10-26 DIAGNOSIS — I350 Nonrheumatic aortic (valve) stenosis: Secondary | ICD-10-CM | POA: Diagnosis not present

## 2019-10-26 DIAGNOSIS — Z7982 Long term (current) use of aspirin: Secondary | ICD-10-CM | POA: Diagnosis not present

## 2019-10-26 DIAGNOSIS — E785 Hyperlipidemia, unspecified: Secondary | ICD-10-CM | POA: Diagnosis not present

## 2019-10-26 DIAGNOSIS — Z7902 Long term (current) use of antithrombotics/antiplatelets: Secondary | ICD-10-CM | POA: Insufficient documentation

## 2019-10-26 DIAGNOSIS — Z951 Presence of aortocoronary bypass graft: Secondary | ICD-10-CM | POA: Insufficient documentation

## 2019-10-26 DIAGNOSIS — I480 Paroxysmal atrial fibrillation: Secondary | ICD-10-CM

## 2019-10-26 DIAGNOSIS — Z955 Presence of coronary angioplasty implant and graft: Secondary | ICD-10-CM | POA: Insufficient documentation

## 2019-10-26 DIAGNOSIS — Z8673 Personal history of transient ischemic attack (TIA), and cerebral infarction without residual deficits: Secondary | ICD-10-CM | POA: Diagnosis not present

## 2019-10-26 DIAGNOSIS — I25810 Atherosclerosis of coronary artery bypass graft(s) without angina pectoris: Secondary | ICD-10-CM | POA: Insufficient documentation

## 2019-10-26 DIAGNOSIS — I5021 Acute systolic (congestive) heart failure: Secondary | ICD-10-CM

## 2019-10-26 DIAGNOSIS — Z952 Presence of prosthetic heart valve: Secondary | ICD-10-CM | POA: Diagnosis not present

## 2019-10-26 DIAGNOSIS — Z8249 Family history of ischemic heart disease and other diseases of the circulatory system: Secondary | ICD-10-CM | POA: Diagnosis not present

## 2019-10-26 DIAGNOSIS — I4892 Unspecified atrial flutter: Secondary | ICD-10-CM | POA: Diagnosis not present

## 2019-10-26 DIAGNOSIS — E782 Mixed hyperlipidemia: Secondary | ICD-10-CM

## 2019-10-26 DIAGNOSIS — I472 Ventricular tachycardia: Secondary | ICD-10-CM | POA: Diagnosis not present

## 2019-10-26 DIAGNOSIS — Z79899 Other long term (current) drug therapy: Secondary | ICD-10-CM | POA: Insufficient documentation

## 2019-10-26 DIAGNOSIS — Z833 Family history of diabetes mellitus: Secondary | ICD-10-CM | POA: Insufficient documentation

## 2019-10-26 DIAGNOSIS — Z803 Family history of malignant neoplasm of breast: Secondary | ICD-10-CM | POA: Insufficient documentation

## 2019-10-26 LAB — COMPREHENSIVE METABOLIC PANEL
ALT: 36 U/L (ref 0–44)
AST: 29 U/L (ref 15–41)
Albumin: 4.2 g/dL (ref 3.5–5.0)
Alkaline Phosphatase: 101 U/L (ref 38–126)
Anion gap: 13 (ref 5–15)
BUN: 26 mg/dL — ABNORMAL HIGH (ref 8–23)
CO2: 27 mmol/L (ref 22–32)
Calcium: 9.5 mg/dL (ref 8.9–10.3)
Chloride: 98 mmol/L (ref 98–111)
Creatinine, Ser: 1.3 mg/dL — ABNORMAL HIGH (ref 0.61–1.24)
GFR calc Af Amer: >60 mL/min (ref >60)
GFR calc non Af Amer: 54 mL/min — ABNORMAL LOW (ref >60)
Glucose, Bld: 311 mg/dL — ABNORMAL HIGH (ref 70–99)
Potassium: 4.3 mmol/L (ref 3.5–5.1)
Sodium: 138 mmol/L (ref 135–145)
Total Bilirubin: 0.9 mg/dL (ref 0.3–1.2)
Total Protein: 7.4 g/dL (ref 6.5–8.1)

## 2019-10-26 LAB — CBC
HCT: 42.6 % (ref 39.0–52.0)
Hemoglobin: 12.5 g/dL — ABNORMAL LOW (ref 13.0–17.0)
MCH: 25.5 pg — ABNORMAL LOW (ref 26.0–34.0)
MCHC: 29.3 g/dL — ABNORMAL LOW (ref 30.0–36.0)
MCV: 86.8 fL (ref 80.0–100.0)
Platelets: 516 10*3/uL — ABNORMAL HIGH (ref 150–400)
RBC: 4.91 MIL/uL (ref 4.22–5.81)
WBC: 7.7 10*3/uL (ref 4.0–10.5)
nRBC: 0.5 % — ABNORMAL HIGH (ref 0.0–0.2)

## 2019-10-26 LAB — MAGNESIUM: Magnesium: 2.5 mg/dL — ABNORMAL HIGH (ref 1.7–2.4)

## 2019-10-26 MED ORDER — FUROSEMIDE 20 MG PO TABS: 40.0000 mg | ORAL_TABLET | Freq: Every day | ORAL | 3 refills | Status: DC

## 2019-10-26 MED ORDER — AMIODARONE HCL 200 MG PO TABS: 200.0000 mg | ORAL_TABLET | Freq: Every day | ORAL | 6 refills | Status: DC

## 2019-10-26 MED ORDER — LOSARTAN POTASSIUM 25 MG PO TABS: 12.5000 mg | ORAL_TABLET | Freq: Every day | ORAL | 3 refills | Status: DC

## 2019-10-26 NOTE — Patient Instructions (Addendum)
Take Amiodarone 200mg  daily.  Routine lab work today. Will notify you of abnormal results  STOP aspirin on 11/16/2019  Decrease Lasix to 40mg  daily.   Take Losartan 12.5mg  daily  Follow up virtual visit with Dr.McLean in 4 weeks.

## 2019-10-26 NOTE — Progress Notes (Signed)
PCP: Primary Cardiologist:  HPI: [name] [name] a 73 y.o.malewith a history of CAD s/p CABG, mechanical AVR on coumadin, extensive PVD, DM, HTN, HLD, ITP, TIA, and recurrent GI bleed. Note, requires lovenox bridging off heparin.  Admitted with chest pain and dyspnea. Hospital course complicated by A flutter/low output heart failure.  S/P successful orbital atherectomy, PTCA, and stenting of protected left main with a 4.0x15 mm resolute onyx DES 11/3. LIMA-LAD patent. SVG-PDA patent. ECHO showed EF 30-35%, mechanical AoV ok. TEE 11/9 w/ improved EF, 40-45%. Placed on milrinone and later weaned off. No BB or ARB at the time of discharge due to hypotension.Diuresed with IV lasix and transitioned to lasix 40 mg twice a day. He was set up for follow up in the coumadin clinic.  Today he returns for HF follow up.Overall feeling fine. Wants to start cardiac rehab. Denies SOB/PND/Orthopnea. Appetite ok. No fever or chills. Weight at home has been trending down since discharge from 169-->158 pounds. Taking all medications.   PMH  1. CAD  S/P CABG S/P successful orbital atherectomy, PTCA, and stenting of protected left main with a 4.0x15 mm Resolute Onyx DES 11/3. LIMA-LAD patent. SVG-PDA patent. 2.Mechanical AVR 3. PAD 4. DM 5. HTN 6. HLD 7. ITP  8. TIA  9. Chronic Systolic HF  Echo: EF 62-70%, mechanical AoV ok. TEE 11/9 w/ improved EF, 40-45%.    ROS: All systems negative except as listed in HPI, PMH and Problem List.  SH:  Social History   Socioeconomic History  . Marital status: Divorced    Spouse name: Not on file  . Number of children: 3  . Years of education: Not on file  . Highest education level: Not on file  Occupational History  . Occupation: Retired  Scientific laboratory technician  . Financial resource strain: Not on file  . Food insecurity    Worry: Not on file    Inability: Not on file  . Transportation needs    Medical: Not on file    Non-medical: Not on file  Tobacco  Use  . Smoking status: Light Tobacco Smoker    Years: 30.00    Types: Cigarettes, Cigars    Last attempt to quit: 12/15/1993    Years since quitting: 25.8  . Smokeless tobacco: Never Used  . Tobacco comment: occasional cigar  Substance and Sexual Activity  . Alcohol use: Yes    Alcohol/week: 16.0 standard drinks    Types: 1 Cans of beer, 1 Shots of liquor, 14 Standard drinks or equivalent per week    Comment: drinks 2 martini's a night (2 shots in each)  . Drug use: No  . Sexual activity: Yes  Lifestyle  . Physical activity    Days per week: Not on file    Minutes per session: Not on file  . Stress: Not on file  Relationships  . Social Herbalist on phone: Not on file    Gets together: Not on file    Attends religious service: Not on file    Active member of club or organization: Not on file    Attends meetings of clubs or organizations: Not on file    Relationship status: Not on file  . Intimate partner violence    Fear of current or ex partner: Not on file    Emotionally abused: Not on file    Physically abused: Not on file    Forced sexual activity: Not on file  Other Topics Concern  .  Not on file  Social History Narrative   Tries to remain active.  Frequent golfer but claudication limits this.    FH:  Family History  Problem Relation Age of Onset  . Coronary artery disease Mother        bypass surgery - deceased  . Heart disease Father        murmur, valve replacement - deceased  . Breast cancer Sister   . Diabetes Other        grandmother  . Diabetes Paternal Grandmother   . Diabetes Paternal Aunt   . Colon cancer Neg Hx   . Colon polyps Neg Hx   . Esophageal cancer Neg Hx   . Rectal cancer Neg Hx   . Stomach cancer Neg Hx     Past Medical History:  Diagnosis Date  . Adenomatous colon polyp 09/1997  . Anemia   . Aortic stenosis    s/p st. jude mechanical AVR - Chronic Coumadin  . Blood transfusion    "related to ITP"  . Coronary artery  disease    s/p cabg x 3 11/2003: lima-lad, seq vg to rpda and rpl  . Diverticulitis of colon   . Heart murmur   . Hyperlipidemia   . Hypertension   . ITP (idiopathic thrombocytopenic purpura)   . Peripheral arterial disease ([place])    a. history of aortobifemoral bypass grafting by Dr. Sherren Mocha early b. LE angiography 04/22/2015 patent aortobifem graft, DES to R SFA  . Peripheral vascular disease ([place])    s/p Left external Iliac Artery stenting and subsequent left femoral endarterectomy 02/2011- post op course complicated by wound infxn req I&D 03/2011  . Renal artery stenosis, native, bilateral (HCC)    a. bilateral renal artery stenosis by recent duplex ultrasound b. L renal artery stent 02/2015, R renal artery patent on angiogram  . Stroke ([place])   . TIA (transient ischemic attack) ~ 2013  . Type II diabetes mellitus ([place])     Current Outpatient Medications  Medication Sig Dispense Refill  . amiodarone (PACERONE) 200 MG tablet 200 mg twice a day for 7 days then 200 mg daily 45 tablet 6  . aspirin 81 MG EC tablet Take 1 tablet (81 mg total) by mouth daily. 30 tablet 0  . atorvastatin (LIPITOR) 80 MG tablet Take 80 mg by mouth daily.     . canagliflozin (INVOKANA) 300 MG TABS tablet Take 300 mg by mouth daily.     . clopidogrel (PLAVIX) 75 MG tablet Take 1 tablet (75 mg total) by mouth daily. 30 tablet 6  . digoxin (LANOXIN) 0.125 MG tablet Take 1 tablet (0.125 mg total) by mouth daily. 30 tablet 6  . Evolocumab (REPATHA SURECLICK) 338 MG/ML SOAJ Inject 140 mg into the skin every 14 (fourteen) days.    . furosemide (LASIX) 20 MG tablet Take 2 tablets (40 mg total) by mouth 2 (two) times daily. 180 tablet 3  . glyBURIDE (DIABETA) 2.5 MG tablet Take 5 mg by mouth 2 (two) times daily with a meal.     . nitroGLYCERIN (NITROSTAT) 0.4 MG SL tablet Place 1 tablet (0.4 mg total) under the tongue every 5 (five) minutes x 3 doses as needed for chest pain. 25 tablet 12  . pantoprazole (PROTONIX) 40 MG  tablet Take 40 mg by mouth daily.    . potassium chloride SA (KLOR-CON) 20 MEQ tablet Take 1 tablet (20 mEq total) by mouth daily. 30 tablet 6  . spironolactone (ALDACTONE) 25 MG tablet  Take 1 tablet (25 mg total) by mouth daily. 30 tablet 6  . warfarin (COUMADIN) 10 MG tablet Take 5 mg on Mon/Wed/Fri and 10 mg on Tue/Thur/Sat/Sun. (Patient taking differently: Take 5-10 mg by mouth See admin instructions. Take 5 mg on Mon/Wed/Fri/Sat and 10 mg on Tue/Thur/Sun.) 35 tablet 3   No current facility-administered medications for this encounter.     Vitals:   10/26/19 1012  BP: 113/62  Pulse: 61  SpO2: 96%  Weight: 73.9 kg (163 lb)   Wt Readings from Last 3 Encounters:  10/26/19 73.9 kg (163 lb)  10/17/19 75.7 kg (166 lb 12.8 oz)  09/19/19 77.2 kg (170 lb 1.6 oz)    PHYSICAL EXAM: General:  Well appearing. No resp difficulty HEENT: normal Neck: supple. JVP flat. Carotids 2+ bilaterally; no bruits. No lymphadenopathy or thryomegaly appreciated. Cor: PMI normal. Regular rate & rhythm. No rubs, gallops. Mechanical heart sounds.  Lungs: clear Abdomen: soft, nontender, nondistended. No hepatosplenomegaly. No bruits or masses. Good bowel sounds. Extremities: no cyanosis, clubbing, rash, edema Neuro: alert & orientedx3, cranial nerves grossly intact. Moves all 4 extremities w/o difficulty. Affect pleasant.   ECG: NSR 61 bpm personally reviewed.    ASSESSMENT & PLAN: 1. Chronic Systolic CHF: Ischemic cardiomyopathy. Echo 10/18/19 EF 30-35% and wall motion abnormalities, prior echo earlier this month showed EF 50-55%. Cath 10/20 showed jeopardized ramus and LCx territory (99% distal left main, occluded LAD, patent LIMA-LAD). Concerned that ischemia in this territory has triggered CHF.  - NYHA II. Volume status low. Cut back lasix to 40 mg daily.  - Continue Spiro and digoxin  -Add 12.5 mg losartan at bed time.  - Check BMET today and in 7 days.  - Hold off on bb. 2. CAD: Complicated  disease, coronary angiography 10/20 showed patent SVG-RCA territory and patent LIMA-LAD. The proximal LAD was occluded and there was 99% distal left main stenosis. This left the ramus and LCx in jeopardy. He was not a good candidate for protected PCI =>cannot place Impella with mechanical aortic valve and peripheral vascular disease likely precludes a femoral IABP. It was decided to try to manage him medically. However, he returned with progressive chest pain episodes and NSTEMI, hs-TnI up to 935. S/p successful orbital atherectomy, PTCA, and stenting of the left main with a 4.0x15 mm Resolute Onyx DES 11/3. - No chest pain.   - Plan triple therapy w/ ASA, Plavix + Coumadin.   -Stop ASA after 1 month ( 11/16/19).  -Plavix for at least 6 months if tolerated.  - PPI for GI protection, Protonix 40 daily.  - Continue atorvastatin 80 daily. LDL goal <70 + repatha every 14 days.  3. Mechanical aortic valve: Stable on echo this admission. Crisp mechanical sounds on exam - Continue ASA 81.  -  Continue warfarin and will aim for INR 2.5-3.  - Followed by the Coumadin Clinic on Friday.  4. NSVT:amiodarone gtt started. Improved. This was likely due to ischemia. Had sinus pauses x 2 during sleep 11/2, up to 3.2 sec. No recurrence.  - ContinuePO amiodarone. Cut back amiodarone 200 mg daily.  - Check BMET and Mag level.  5. H/o GI bleeding:   Requiring triple therapy x 30 days for newly placed LM DES + Afib and mechanical AV. He has had feraheme. Check CBC.  - Continue PPI for GI protection. 6. PAD: Patient with aorto-bifemoral bypass, h/o iliac stenting, h/o femoral endarterectomy, PCI to right SFA in 2017.  7. H/o renal artery stenosis.  8. H/O Aki: Check BMET today.  9. Elevated LFTs: Shock liver versus congestive hepatopathy recent admit  - Check LFTs today. 10. DM2:Per PCP 11. PAF- S/p successfulTEE-DCCV 11/9.  - In NSR today.  - Cut amio to 200 mg daily.  -off ?blocker due to  hypotension  -Continue coumadin.  -Consider atrial fibrillation ablation down the road ([facility]HF).  We will notify cardiac rehab. He can start as soon as he has an opening.   Follow up with Dr Aundra Dubin in 4 weeks. Greater than 50% of the (total minutes 25) visit spent in counseling/coordination of care regarding the above.     Amy Clegg NP-C  10:27 AM

## 2019-10-27 ENCOUNTER — Ambulatory Visit (INDEPENDENT_AMBULATORY_CARE_PROVIDER_SITE_OTHER): Payer: Medicare Other | Admitting: *Deleted

## 2019-10-27 DIAGNOSIS — Z5181 Encounter for therapeutic drug level monitoring: Secondary | ICD-10-CM

## 2019-10-27 LAB — POCT INR: INR: 2.2 (ref 2.0–3.0)

## 2019-10-27 NOTE — Patient Instructions (Signed)
Description   Take 1 tablet today, then continue taking coumadin 1/2 tablet daily except for 1 tablet on Sunday and Thursdays.  Recheck INR in 1.5 week. Call coumadin Clinic for any changes in medications or upcoming procedures. [phone]

## 2019-10-30 ENCOUNTER — Ambulatory Visit (INDEPENDENT_AMBULATORY_CARE_PROVIDER_SITE_OTHER): Payer: Medicare Other | Admitting: Cardiovascular Disease

## 2019-10-30 ENCOUNTER — Encounter: Payer: Self-pay | Admitting: Cardiovascular Disease

## 2019-10-30 ENCOUNTER — Telehealth: Payer: Self-pay

## 2019-10-30 ENCOUNTER — Other Ambulatory Visit: Payer: Self-pay

## 2019-10-30 DIAGNOSIS — I480 Paroxysmal atrial fibrillation: Secondary | ICD-10-CM | POA: Diagnosis not present

## 2019-10-30 DIAGNOSIS — Z952 Presence of prosthetic heart valve: Secondary | ICD-10-CM | POA: Diagnosis not present

## 2019-10-30 DIAGNOSIS — I701 Atherosclerosis of renal artery: Secondary | ICD-10-CM | POA: Diagnosis not present

## 2019-10-30 DIAGNOSIS — I10 Essential (primary) hypertension: Secondary | ICD-10-CM

## 2019-10-30 DIAGNOSIS — I5021 Acute systolic (congestive) heart failure: Secondary | ICD-10-CM

## 2019-10-30 DIAGNOSIS — I739 Peripheral vascular disease, unspecified: Secondary | ICD-10-CM

## 2019-10-30 DIAGNOSIS — I2510 Atherosclerotic heart disease of native coronary artery without angina pectoris: Secondary | ICD-10-CM

## 2019-10-30 DIAGNOSIS — E7800 Pure hypercholesterolemia, unspecified: Secondary | ICD-10-CM

## 2019-10-30 NOTE — Assessment & Plan Note (Signed)
Successful DC cardioversion by Dr. Algernon Huxley after a TEE showed no evidence of intracardiac thrombi.  His most recent EKG performed 10/26/2019 revealed that he was in sinus rhythm.  He is on amiodarone.

## 2019-10-30 NOTE — Progress Notes (Signed)
10/30/2019 [name] [name]   10/04/1946  [id number]  Primary Physician Shon Baton, MD Primary Cardiologist: Lorretta Harp MD [name], [place]  HPI:  [name] [name] is a 73 y.o.  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 I last saw him in the office  07/12/2018. He retired a little over 2 years ago having worked there for 25 years. His primary care physician is Dr. Virgina Jock. He was referred by Dr. Johnsie Cancel for peripheral vascular evaluation because of resistant hypertension with duplex evidence of bilateral renal artery stenosis as well as new-onset right calf claudication with recent Dopplers that showed a lesion in his mid right SFA. He does have a history of remote tobacco abuse as well as treated hypertension, hyperlipidemia and diabetes. He has had coronary bypass grafting and St. Jude aVR performed by Dr. Cyndia Bent December 2004 on Coumadin anticoagulation. He has also had aortobifemoral bypass grafting, left external iliac stenting and left common femoral endarterectomy performed by Dr. Donnetta Hutching. I performed left renal artery stenting 02/21/15 with an excellent angiographic and clinical result. His blood pressure has been under better control. I then perform staged right SFA directional atherectomy 04/22/15 removing a high-grade eccentric calcified mid to distal right SFA plaque. He did have progression of disease at the origin of his right SFA. His claudication markedly improved after his procedure however since I saw on the year ago he's had some recurrent claudication. His blood pressures have been under better control since his renal artery intervention. Recent lower extremity Doppler studies performed 07/15/16 revealed a right ABI 0.34 with a new high-frequency signal in the origin of his right SFA. I performed angiography 08/24/16 revealing a  95% proximal right SFA stenosis, 80% mid right SFA stenosis and 80% calcified mid left SFA stenosis. In addition, he had 75% "in-stent restenosis" within the previously placed left renal artery stent. Intervention will be somewhat challenging given the proximity of the proximal right SFA stenosis . He underwent peripheral angiography intervention by myself 10/01/16. Right antegrade approach. I was able to stick the hood of his aortobifem and intrarenal in the proximal right SFA with drug-eluting [place] plasty and stents. I also performed drug-eluting plasty in the mid right SFA. His symptoms resolved after that and he is back playing golf. He was discharged on the following day. His Dopplers have normalized. Since I saw him in the office a year ago he's remained stable. He denies claudication. His most recent Dopplers performed in July oflastyear revealed widely patent proximal right SFA stent and mid right SFA. Since I saw him several months ago he was recently admitted with a stroke. His INR was subtherapeutic for unclear reasons. He was re-anticoagulated. An MRI showed a small left frontal CVA. He did have some facial drooping and numbness which is slowly improving. He still denies claudication.  Since I saw him in the office 07/12/2018 he was admitted with chest pain and underwent diagnostic coronary angiography by myself on 07/18/2018 revealing high-grade calcified left main stenosis, occluded LAD proximally with a patent LIMA and high-grade mid AV groove circumflex.  He had an occluded dominant RCA at its origin with a patent sequential vein to the PDA and PLA.  At that time I elected to treat him medically because of the complexity of the revascularization procedure.  Unfortunately, he was readmitted with heart failure and  non-STEMI and underwent orbital atherectomy, PCI and drug-eluting stenting by Dr. Burt Knack using a 4 mm x 15 mm long resolute Onyx drug-eluting stent.  His mid AV groove circumflex  was not intervened on.  His EF increased as a result from 30-35 to 40-45.  In addition, he he underwent successful DC cardioversion from A. fib to sinus rhythm after TEE showed no evidence of intracardiac thrombi.  He was discharged home on "triple therapy" including low-dose aspirin, Plavix and Coumadin anticoagulation for his Saint Jude mechanical valve with plan to drop his aspirin after 30 days.  At the time of angiography in October I did note 80% "in-stent restenosis within his proximal right SFA stent and he does admit to claudication.    Current Meds  Medication Sig   amiodarone (PACERONE) 200 MG tablet Take 1 tablet (200 mg total) by mouth daily.   aspirin 81 MG EC tablet Take 1 tablet (81 mg total) by mouth daily.   atorvastatin (LIPITOR) 80 MG tablet Take 80 mg by mouth daily.    canagliflozin (INVOKANA) 300 MG TABS tablet Take 300 mg by mouth daily.    clopidogrel (PLAVIX) 75 MG tablet Take 1 tablet (75 mg total) by mouth daily.   digoxin (LANOXIN) 0.125 MG tablet Take 1 tablet (0.125 mg total) by mouth daily.   Evolocumab (REPATHA SURECLICK) 638 MG/ML SOAJ Inject 140 mg into the skin every 14 (fourteen) days.   furosemide (LASIX) 20 MG tablet Take 2 tablets (40 mg total) by mouth daily.   glyBURIDE (DIABETA) 2.5 MG tablet Take 5 mg by mouth 2 (two) times daily with a meal.    losartan (COZAAR) 25 MG tablet Take 0.5 tablets (12.5 mg total) by mouth at bedtime.   nitroGLYCERIN (NITROSTAT) 0.4 MG SL tablet Place 1 tablet (0.4 mg total) under the tongue every 5 (five) minutes x 3 doses as needed for chest pain.   pantoprazole (PROTONIX) 40 MG tablet Take 40 mg by mouth daily.   potassium chloride SA (KLOR-CON) 20 MEQ tablet Take 1 tablet (20 mEq total) by mouth daily.   spironolactone (ALDACTONE) 25 MG tablet Take 1 tablet (25 mg total) by mouth daily.   warfarin (COUMADIN) 10 MG tablet Take 5 mg on Mon/Wed/Fri and 10 mg on Tue/Thur/Sat/Sun. (Patient taking differently:  Take 5-10 mg by mouth See admin instructions. Take 5 mg on Mon/Wed/Fri/Sat and 10 mg on Tue/Thur/Sun.)     No Known Allergies  Social History   Socioeconomic History   Marital status: Divorced    Spouse name: Not on file   Number of children: 3   Years of education: Not on file   Highest education level: Not on file  Occupational History   Occupation: Retired  Scientist, product/process development strain: Not on file   Food insecurity    Worry: Not on file    Inability: Not on Lexicographer needs    Medical: Not on file    Non-medical: Not on file  Tobacco Use   Smoking status: Light Tobacco Smoker    Years: 30.00    Types: Cigarettes, Cigars    Last attempt to quit: 12/15/1993    Years since quitting: 25.8   Smokeless tobacco: Never Used   Tobacco comment: occasional cigar  Substance and Sexual Activity   Alcohol use: Yes    Alcohol/week: 16.0 standard drinks    Types: 1 Cans of beer, 1 Shots of liquor, 14 Standard drinks or equivalent per week  Comment: drinks 2 martini's a night (2 shots in each)   Drug use: No   Sexual activity: Yes  Lifestyle   Physical activity    Days per week: Not on file    Minutes per session: Not on file   Stress: Not on file  Relationships   Social connections    Talks on phone: Not on file    Gets together: Not on file    Attends religious service: Not on file    Active member of club or organization: Not on file    Attends meetings of clubs or organizations: Not on file    Relationship status: Not on file   Intimate partner violence    Fear of current or ex partner: Not on file    Emotionally abused: Not on file    Physically abused: Not on file    Forced sexual activity: Not on file  Other Topics Concern   Not on file  Social History Narrative   Tries to remain active.  Frequent golfer but claudication limits this.     Review of Systems: General: negative for chills, fever, night sweats or weight  changes.  Cardiovascular: negative for chest pain, dyspnea on exertion, edema, orthopnea, palpitations, paroxysmal nocturnal dyspnea or shortness of breath Dermatological: negative for rash Respiratory: negative for cough or wheezing Urologic: negative for hematuria Abdominal: negative for nausea, vomiting, diarrhea, bright red blood per rectum, melena, or hematemesis Neurologic: negative for visual changes, syncope, or dizziness All other systems reviewed and are otherwise negative except as noted above.    Blood pressure 140/68, pulse 66, height 5\' 8"  (1.727 m), weight 166 lb 3.2 oz (75.4 kg), SpO2 92 %.  General appearance: alert and no distress Neck: no adenopathy, no JVD, supple, symmetrical, trachea midline, thyroid not enlarged, symmetric, no tenderness/mass/nodules and Bilateral carotid bruits right lower of the left Lungs: clear to auscultation bilaterally Heart: Crisp mechanical aortic valve sounds with 2/6 outflow tract murmur Extremities: extremities normal, atraumatic, no cyanosis or edema Pulses: 2+ and symmetric Skin: Skin color, texture, turgor normal. No rashes or lesions Neurologic: Alert and oriented X 3, normal strength and tone. Normal symmetric reflexes. Normal coordination and gait  EKG not performed today  ASSESSMENT AND PLAN:   Essential hypertension History of essential hypertension blood pressure measured today at 140/68.  He is on losartan.  H/O mechanical aortic valve replacement History of mechanical Saint Jude AVR performed by Dr. Cyndia Bent December 2004 at the time of bypass grafting.  He is on Coumadin anticoagulation.  HYPERCHOLESTEROLEMIA History of hyperlipidemia on Lipitor and Repatha with lipid profile performed 10/07/2019 revealing total cholesterol of 74, LDL of 20 and HDL of 45  Coronary atherosclerosis History of CAD status post CABG by Dr. Cyndia Bent in 2004.  I performed cardiac catheterization on him 09/18/2019 because of chest pain.  He had  high-grade calcified left main with an occluded LAD and a patent LIMA to his LAD with moderate mid AV groove circumflex disease.  He had an occluded dominant RCA with patent vein graft to the PDA and PLA.  I elected to treat him medically at that time.  Unfortunately he was readmitted with chest pain and heart failure.  His EF had declined to 30 to 35% range.  He underwent orbital atherectomy, PCI drug-eluting stenting of his left main with a 4 mm x 15 mm long resolute Onyx drug-eluting stent by Dr. Burt Knack via the femoral approach with a temporary transvenous pacemaker placed.  Subsequent to that  his EF improved to the 40 to 45% range and his symptoms have gradually improved as well.  He remains on triple therapy.  I am going to stop his aspirin on 12 3 and continue him on Plavix and Coumadin.  PVD (peripheral vascular disease) ([place]) History of remote aortobifemoral bypass grafting and left this Dr. Iliac artery stenting with left common femoral endarterectomy by Dr. Donnetta Hutching.  I performed left renal artery stenting 02/21/2015 with an excellent result.  Also performed staged right SFA atherectomy 04/22/2015 removing a high-grade eccentric calcified plaque from the mid to distal SFA.  Because of a high frequency signal in his proximal right SFA with an ABI that it declined to 0.34 artery angiogram 10 08/24/2016 revealing a 95% proximal right SFA stenosis, 80% mid as well as a calcified 80% mid left SFA stenosis.  He had 75% "in-stent restenosis with a previously placed left renal artery stent.  I performed intervention on his proximal right SFA via the antegrade approach and sticking the hood of his aortobifem placing drug-eluting stents.  His Dopplers normalized however at the time of recent angiography 09/18/2019 I did image his proximal right SFA stent demonstrating 80% "in-stent restenosis.  He wishes to delay intervention for several months given his prolonged recent hospitalizations.  Acute systolic heart  failure (HCC) Recent EF in the 30 to 35% range prior to left main intervention after which his EF rose to the 40 to 45% range.  He is on appropriate medications.  Paroxysmal atrial fibrillation (HCC) Successful DC cardioversion by Dr. Algernon Huxley after a TEE showed no evidence of intracardiac thrombi.  His most recent EKG performed 10/26/2019 revealed that he was in sinus rhythm.  He is on amiodarone.      Lorretta Harp MD FACP,FACC,FAHA, [facility] 10/30/2019 3:50 PM

## 2019-10-30 NOTE — Assessment & Plan Note (Signed)
History of CAD status post CABG by Dr. Cyndia Bent in 2004.  I performed cardiac catheterization on him 09/18/2019 because of chest pain.  He had high-grade calcified left main with an occluded LAD and a patent LIMA to his LAD with moderate mid AV groove circumflex disease.  He had an occluded dominant RCA with patent vein graft to the PDA and PLA.  I elected to treat him medically at that time.  Unfortunately he was readmitted with chest pain and heart failure.  His EF had declined to 30 to 35% range.  He underwent orbital atherectomy, PCI drug-eluting stenting of his left main with a 4 mm x 15 mm long resolute Onyx drug-eluting stent by Dr. Burt Knack via the femoral approach with a temporary transvenous pacemaker placed.  Subsequent to that his EF improved to the 40 to 45% range and his symptoms have gradually improved as well.  He remains on triple therapy.  I am going to stop his aspirin on 12 3 and continue him on Plavix and Coumadin.

## 2019-10-30 NOTE — Assessment & Plan Note (Signed)
History of essential hypertension blood pressure measured today at 140/68.  He is on losartan.

## 2019-10-30 NOTE — Assessment & Plan Note (Signed)
Recent EF in the 30 to 35% range prior to left main intervention after which his EF rose to the 40 to 45% range.  He is on appropriate medications.

## 2019-10-30 NOTE — Assessment & Plan Note (Signed)
History of hyperlipidemia on Lipitor and Repatha with lipid profile performed 10/07/2019 revealing total cholesterol of 74, LDL of 20 and HDL of 45

## 2019-10-30 NOTE — Patient Instructions (Signed)
Medication Instructions:  Your physician recommends that you continue on your current medications as directed. Please refer to the Current Medication list given to you today.  If you need a refill on your cardiac medications before your next appointment, please call your pharmacy.   Lab work: NONE  Testing/Procedures: NONE  Follow-Up: At [facility], you and your health needs are our priority.  As part of our continuing mission to provide you with exceptional heart care, we have created designated Provider Care Teams.  These Care Teams include your primary Cardiologist (physician) and Advanced Practice Providers (APPs -  Physician Assistants and Nurse Practitioners) who all work together to provide you with the care you need, when you need it. You may see Dr Gwenlyn Found or one of the following Advanced Practice Providers on your designated Care Team:    Kerin Ransom, PA-C  [place], [place]  Coletta Memos, [place]  Your physician wants you to follow-up in:3 months with Kerin Ransom PA and Dr Gwenlyn Found in 6 months.

## 2019-10-30 NOTE — Telephone Encounter (Signed)
LVM to let pt know that Dr Gwenlyn Found wants him to stop taking Aspirin 12/3 and continue to take his other blood thinners.

## 2019-10-30 NOTE — Assessment & Plan Note (Signed)
History of remote aortobifemoral bypass grafting and left this Dr. Iliac artery stenting with left common femoral endarterectomy by Dr. Donnetta Hutching.  I performed left renal artery stenting 02/21/2015 with an excellent result.  Also performed staged right SFA atherectomy 04/22/2015 removing a high-grade eccentric calcified plaque from the mid to distal SFA.  Because of a high frequency signal in his proximal right SFA with an ABI that it declined to 0.34 artery angiogram 10 08/24/2016 revealing a 95% proximal right SFA stenosis, 80% mid as well as a calcified 80% mid left SFA stenosis.  He had 75% "in-stent restenosis with a previously placed left renal artery stent.  I performed intervention on his proximal right SFA via the antegrade approach and sticking the hood of his aortobifem placing drug-eluting stents.  His Dopplers normalized however at the time of recent angiography 09/18/2019 I did image his proximal right SFA stent demonstrating 80% "in-stent restenosis.  He wishes to delay intervention for several months given his prolonged recent hospitalizations.

## 2019-10-30 NOTE — Assessment & Plan Note (Signed)
History of mechanical Saint Jude AVR performed by Dr. Cyndia Bent December 2004 at the time of bypass grafting.  He is on Coumadin anticoagulation.

## 2019-11-06 ENCOUNTER — Other Ambulatory Visit: Payer: Self-pay

## 2019-11-06 ENCOUNTER — Ambulatory Visit (INDEPENDENT_AMBULATORY_CARE_PROVIDER_SITE_OTHER): Payer: Medicare Other | Admitting: *Deleted

## 2019-11-06 DIAGNOSIS — I359 Nonrheumatic aortic valve disorder, unspecified: Secondary | ICD-10-CM | POA: Diagnosis not present

## 2019-11-06 DIAGNOSIS — G459 Transient cerebral ischemic attack, unspecified: Secondary | ICD-10-CM

## 2019-11-06 DIAGNOSIS — Z5181 Encounter for therapeutic drug level monitoring: Secondary | ICD-10-CM

## 2019-11-06 LAB — POCT INR: INR: 3.4 — AB (ref 2.0–3.0)

## 2019-11-06 NOTE — Patient Instructions (Signed)
Description   Tomorrow take 1/2 tablet then continue taking coumadin 1/2 tablet daily except for 1 tablet on Sunday and Thursdays.  Recheck INR in 2 weeks. Call coumadin Clinic for any changes in medications or upcoming procedures. [phone]

## 2019-11-07 ENCOUNTER — Telehealth (HOSPITAL_COMMUNITY): Payer: Self-pay | Admitting: Pharmacist

## 2019-11-07 NOTE — Telephone Encounter (Signed)
Cardiac Rehab Medication Review by a Pharmacist  Does the patient feel that his/her medications are working for him/her?  yes  Has the patient been experiencing any side effects to the medications prescribed?  no  Does the patient measure his/her own blood pressure or blood glucose at home?  no   Does the patient have any problems obtaining medications due to transportation or finances?   no  Understanding of regimen: good Understanding of indications: good Potential of compliance: good  Pharmacist comments: N/A  Lorel Monaco, PharmD PGY1 Ambulatory Care Resident [facility] # [phone]

## 2019-11-08 ENCOUNTER — Other Ambulatory Visit (HOSPITAL_COMMUNITY): Payer: Self-pay | Admitting: Adult Health

## 2019-11-08 ENCOUNTER — Telehealth (HOSPITAL_COMMUNITY): Payer: Self-pay | Admitting: Pharmacist

## 2019-11-08 DIAGNOSIS — L918 Other hypertrophic disorders of the skin: Secondary | ICD-10-CM | POA: Diagnosis not present

## 2019-11-08 DIAGNOSIS — D225 Melanocytic nevi of trunk: Secondary | ICD-10-CM | POA: Diagnosis not present

## 2019-11-08 DIAGNOSIS — L570 Actinic keratosis: Secondary | ICD-10-CM | POA: Diagnosis not present

## 2019-11-08 NOTE — Telephone Encounter (Signed)
Patient underwent cardiac catheterization with placement of drug-eluting stent on 11/3. He was discharged on triple therapy with aspirin, clopidogrel (Plavix), and warfarin (Coumadin). The plan was to de-escalate therapy after one month. Called the patient to remind them to stop taking aspirin on 12/3. Patient verbalized understanding.

## 2019-11-10 DIAGNOSIS — H33311 Horseshoe tear of retina without detachment, right eye: Secondary | ICD-10-CM | POA: Diagnosis not present

## 2019-11-21 ENCOUNTER — Other Ambulatory Visit: Payer: Self-pay | Admitting: Cardiovascular Disease

## 2019-11-22 ENCOUNTER — Ambulatory Visit (INDEPENDENT_AMBULATORY_CARE_PROVIDER_SITE_OTHER): Payer: Medicare Other

## 2019-11-22 ENCOUNTER — Other Ambulatory Visit: Payer: Self-pay

## 2019-11-22 ENCOUNTER — Telehealth (HOSPITAL_COMMUNITY): Payer: Self-pay | Admitting: *Deleted

## 2019-11-22 DIAGNOSIS — I359 Nonrheumatic aortic valve disorder, unspecified: Secondary | ICD-10-CM | POA: Diagnosis not present

## 2019-11-22 DIAGNOSIS — G459 Transient cerebral ischemic attack, unspecified: Secondary | ICD-10-CM

## 2019-11-22 DIAGNOSIS — Z5181 Encounter for therapeutic drug level monitoring: Secondary | ICD-10-CM

## 2019-11-22 LAB — POCT INR: INR: 5.3 — AB (ref 2.0–3.0)

## 2019-11-22 MED ORDER — WARFARIN SODIUM 5 MG PO TABS: ORAL_TABLET | ORAL | 1 refills | Status: DC

## 2019-11-22 NOTE — Patient Instructions (Signed)
Description   Skip 2 dosages of Coumadin, then start taking 5mg  daily except for 10mg  on Thursdays.  Recheck INR in 2 weeks. Call coumadin Clinic for any changes in medications or upcoming procedures. [phone]

## 2019-11-22 NOTE — Telephone Encounter (Signed)
PC to pt to confirm appt for cardiac rehab orientation on 11/22/2019 and review heath history.  Pt confirmed appt and health history reviewed.  During health history, it was noted that the pt does not check his CBGs and instead relies on his A1C results.  Pt educated on the importance of checking his CBG prior to exercise and associated risks for hypo/hyperglycemia with exercise especially when CBG is unknown.  Pt states that he has a meter and will look to find it.  DM and exercise education will be reinforced with pt.

## 2019-11-23 ENCOUNTER — Encounter (HOSPITAL_COMMUNITY)
Admission: RE | Admit: 2019-11-23 | Discharge: 2019-11-23 | Disposition: A | Discharge status: Home / Self Care | Payer: Medicare Other | Source: Ambulatory Visit | Attending: Cardiovascular Disease | Admitting: Cardiovascular Disease

## 2019-11-23 VITALS — Ht 66.5 in | Wt 170.6 lb

## 2019-11-23 DIAGNOSIS — I214 Non-ST elevation (NSTEMI) myocardial infarction: Secondary | ICD-10-CM

## 2019-11-23 DIAGNOSIS — Z955 Presence of coronary angioplasty implant and graft: Secondary | ICD-10-CM | POA: Insufficient documentation

## 2019-11-23 LAB — GLUCOSE, CAPILLARY: Glucose-Capillary: 205 mg/dL — ABNORMAL HIGH (ref 70–99)

## 2019-11-23 NOTE — Progress Notes (Signed)
Cardiac Individual Treatment Plan  Patient Details  Name: [name] [name] MRN: [id number] Date of Birth: 04/23/1946 Referring Provider:     CARDIAC REHAB PHASE II ORIENTATION from 11/23/2019 in [place]  Referring Provider  Dr. Johnsie Cancel       Initial Encounter Date:    CARDIAC REHAB PHASE II ORIENTATION from 11/23/2019 in [place]  Date  11/23/19      Visit Diagnosis: NSTEMI (non-ST elevated myocardial infarction) [facility])  Status post coronary artery stent placement  Patient's Home Medications on Admission:  Current Outpatient Medications:  .  amiodarone (PACERONE) 200 MG tablet, Take 1 tablet (200 mg total) by mouth daily., Disp: 45 tablet, Rfl: 6 .  aspirin 81 MG EC tablet, TAKE 1 TABLET BY MOUTH EVERY DAY, Disp: 30 tablet, Rfl: 0 .  atorvastatin (LIPITOR) 80 MG tablet, Take 80 mg by mouth daily. , Disp: , Rfl:  .  canagliflozin (INVOKANA) 300 MG TABS tablet, Take 300 mg by mouth daily. , Disp: , Rfl:  .  clopidogrel (PLAVIX) 75 MG tablet, Take 1 tablet (75 mg total) by mouth daily., Disp: 30 tablet, Rfl: 6 .  digoxin (LANOXIN) 0.125 MG tablet, Take 1 tablet (0.125 mg total) by mouth daily., Disp: 30 tablet, Rfl: 6 .  Evolocumab (REPATHA SURECLICK) 448 MG/ML SOAJ, Inject 140 mg into the skin every 14 (fourteen) days., Disp: , Rfl:  .  furosemide (LASIX) 20 MG tablet, Take 2 tablets (40 mg total) by mouth daily., Disp: 90 tablet, Rfl: 3 .  glyBURIDE (DIABETA) 2.5 MG tablet, Take 5 mg by mouth 2 (two) times daily with a meal. , Disp: , Rfl:  .  losartan (COZAAR) 25 MG tablet, Take 0.5 tablets (12.5 mg total) by mouth at bedtime., Disp: 45 tablet, Rfl: 3 .  nitroGLYCERIN (NITROSTAT) 0.4 MG SL tablet, Place 1 tablet (0.4 mg total) under the tongue every 5 (five) minutes x 3 doses as needed for chest pain. (Patient not taking: Reported on 11/07/2019), Disp: 25 tablet, Rfl: 12 .  pantoprazole (PROTONIX) 40 MG tablet,  Take 40 mg by mouth daily., Disp: , Rfl:  .  potassium chloride SA (KLOR-CON) 20 MEQ tablet, Take 1 tablet (20 mEq total) by mouth daily., Disp: 30 tablet, Rfl: 6 .  spironolactone (ALDACTONE) 25 MG tablet, Take 1 tablet (25 mg total) by mouth daily., Disp: 30 tablet, Rfl: 6 .  warfarin (COUMADIN) 5 MG tablet, Take as directed by [facility], Disp: 110 tablet, Rfl: 1  Past Medical History: Past Medical History:  Diagnosis Date  . Adenomatous colon polyp 09/1997  . Anemia   . Aortic stenosis    s/p [facility] mechanical AVR - Chronic Coumadin  . Blood transfusion    "related to ITP"  . Coronary artery disease    s/p cabg x 3 11/2003: lima-lad, seq vg to rpda and rpl  . Diverticulitis of colon   . Heart murmur   . Hyperlipidemia   . Hypertension   . ITP (idiopathic thrombocytopenic purpura)   . Peripheral arterial disease ([place])    a. history of aortobifemoral bypass grafting by Dr. Sherren Mocha early b. LE angiography 04/22/2015 patent aortobifem graft, DES to R SFA  . Peripheral vascular disease ([place])    s/p Left external Iliac Artery stenting and subsequent left femoral endarterectomy 02/2011- post op course complicated by wound infxn req I&D 03/2011  . Renal artery stenosis, native, bilateral (H[facility])    a. bilateral renal artery  stenosis by recent duplex ultrasound b. L renal artery stent 02/2015, R renal artery patent on angiogram  . Stroke ([place])   . TIA (transient ischemic attack) ~ 2013  . Type II diabetes mellitus ([facility])     Tobacco Use: Social History   Tobacco Use  Smoking Status Light Tobacco Smoker  . Years: 30.00  . Types: Cigarettes, Cigars  . Last attempt to quit: 12/15/1993  . Years since quitting: 25.9  Smokeless Tobacco Never Used  Tobacco Comment   occasional cigar    Labs: Recent Review Flowsheet Data    Labs for ITP Cardiac and Pulmonary Rehab Latest Ref Rng & Units 10/13/2019 10/14/2019 10/15/2019 10/16/2019 10/17/2019   Cholestrol 0 - 200 mg/dL - - - - -   LDLCALC 0  - 99 mg/dL - - - - -   HDL >40 mg/dL - - - - -   Trlycerides <150 mg/dL - - - - -   Hemoglobin A1c 4.8 - 5.6 % - - - - -   PHART 7.350 - 7.450 - - - - -   PCO2ART 32.0 - 48.0 mmHg - - - - -   HCO3 20.0 - 28.0 mmol/L - - - - -   TCO2 22 - 32 mmol/L - - - - -   ACIDBASEDEF 0.0 - 2.0 mmol/L - - - - -   O2SAT % 45.5 62.1 64.1 61.3 88.5      Capillary Blood Glucose: Lab Results  Component Value Date   GLUCAP 205 (H) 11/23/2019   GLUCAP 188 (H) 10/17/2019   GLUCAP 384 (H) 10/17/2019   GLUCAP 140 (H) 10/17/2019   GLUCAP 54 (L) 10/17/2019     Exercise Target Goals: Exercise Program Goal: Individual exercise prescription set using results from initial 6 min walk test and THRR while considering  patient's activity barriers and safety.   Exercise Prescription Goal: Initial exercise prescription builds to 30-45 minutes a day of aerobic activity, 2-3 days per week.  Home exercise guidelines will be given to patient during program as part of exercise prescription that the participant will acknowledge.  Activity Barriers & Risk Stratification: Activity Barriers & Cardiac Risk Stratification - 11/23/19 1124      Activity Barriers & Cardiac Risk Stratification   Activity Barriers  Balance Concerns   PAD in right leg.   Cardiac Risk Stratification  High       6 Minute Walk: 6 Minute Walk    Row Name 11/23/19 1123         6 Minute Walk   Phase  Initial     Distance  1400 feet     Walk Time  6 minutes     # of Rest Breaks  1 Pt stopped at 5:30 due to PAD pain in right leg 8/10.     MPH  2.6     METS  2.8     RPE  12     Perceived Dyspnea   0     VO2 Peak  9.8     Symptoms  Yes (comment)     Comments  PAD pain 8/10 in right leg.     Resting HR  65 bpm     Resting BP  108/64     Resting Oxygen Saturation   97 %     Exercise Oxygen Saturation  during 6 min walk  96 %     Max Ex. HR  81 bpm     Max Ex. BP  152/62     2 Minute Post BP  128/64        Oxygen Initial  Assessment:   Oxygen Re-Evaluation:   Oxygen Discharge (Final Oxygen Re-Evaluation):   Initial Exercise Prescription: Initial Exercise Prescription - 11/23/19 1100      Date of Initial Exercise RX and Referring Provider   Date  11/23/19    Referring Provider  Dr. Johnsie Cancel     Expected Discharge Date  01/19/20      Treadmill   MPH  2.4    Grade  1    Minutes  15      NuStep   Level  2    SPM  75    Minutes  15    METs  2.8      Prescription Details   Frequency (times per week)  3    Duration  Progress to 30 minutes of continuous aerobic without signs/symptoms of physical distress      Intensity   THRR 40-80% of Max Heartrate  58-117    Ratings of Perceived Exertion  11-13      Progression   Progression  Continue to progress workloads to maintain intensity without signs/symptoms of physical distress.      Resistance Training   Training Prescription  Yes    Weight  3 lbs.     Reps  10-15       Perform Capillary Blood Glucose checks as needed.  Exercise Prescription Changes:   Exercise Comments:   Exercise Goals and Review: Exercise Goals    Row Name 11/23/19 1126             Exercise Goals   Increase Physical Activity  Yes       Intervention  Provide advice, education, support and counseling about physical activity/exercise needs.;Develop an individualized exercise prescription for aerobic and resistive training based on initial evaluation findings, risk stratification, comorbidities and participant's personal goals.       Expected Outcomes  Short Term: Attend rehab on a regular basis to increase amount of physical activity.;Long Term: Exercising regularly at least 3-5 days a week.;Long Term: Add in home exercise to make exercise part of routine and to increase amount of physical activity.       Increase Strength and Stamina  Yes       Intervention  Provide advice, education, support and counseling about physical activity/exercise needs.;Develop an  individualized exercise prescription for aerobic and resistive training based on initial evaluation findings, risk stratification, comorbidities and participant's personal goals.       Expected Outcomes  Short Term: Increase workloads from initial exercise prescription for resistance, speed, and METs.;Short Term: Perform resistance training exercises routinely during rehab and add in resistance training at home;Long Term: Improve cardiorespiratory fitness, muscular endurance and strength as measured by increased METs and functional capacity (6MWT)       Able to understand and use rate of perceived exertion (RPE) scale  Yes       Intervention  Provide education and explanation on how to use RPE scale       Expected Outcomes  Short Term: Able to use RPE daily in rehab to express subjective intensity level;Long Term:  Able to use RPE to guide intensity level when exercising independently       Knowledge and understanding of Target Heart Rate Range (THRR)  Yes       Intervention  Provide education and explanation of THRR including how the numbers were predicted and where  they are located for reference       Expected Outcomes  Short Term: Able to state/look up THRR;Long Term: Able to use THRR to govern intensity when exercising independently;Short Term: Able to use daily as guideline for intensity in rehab       Able to check pulse independently  Yes       Intervention  Provide education and demonstration on how to check pulse in carotid and radial arteries.;Review the importance of being able to check your own pulse for safety during independent exercise       Expected Outcomes  Short Term: Able to explain why pulse checking is important during independent exercise;Long Term: Able to check pulse independently and accurately       Understanding of Exercise Prescription  Yes       Intervention  Provide education, explanation, and written materials on patient's individual exercise prescription       Expected  Outcomes  Short Term: Able to explain program exercise prescription;Long Term: Able to explain home exercise prescription to exercise independently       Improve claudication pain toleration; Improve walking ability  Yes       Intervention  Participate in PAD/SET Rehab 2-3 days a week, walking at home as part of exercise prescription;Attend education sessions to aid in risk factor modification and understanding of disease process       Expected Outcomes  Short Term: Improve walking distance/time to onset of claudication pain;Long Term: Improve score of PAD questionnaires;Long Term: Improve walking ability and toleration to claudication          Exercise Goals Re-Evaluation :   Discharge Exercise Prescription (Final Exercise Prescription Changes):   Nutrition:  Target Goals: Understanding of nutrition guidelines, daily intake of sodium 1500mg , cholesterol 200mg , calories 30% from fat and 7% or less from saturated fats, daily to have 5 or more servings of fruits and vegetables.  Biometrics: Pre Biometrics - 11/23/19 1127      Pre Biometrics   Height  5' 6.5" (1.689 m)    Weight  77.4 kg    Waist Circumference  39 inches    Hip Circumference  39 inches    Waist to Hip Ratio  1 %    BMI (Calculated)  27.13    Triceps Skinfold  19 mm    % Body Fat  28.2 %    Grip Strength  42 kg    Flexibility  10 in    Single Leg Stand  2.06 seconds        Nutrition Therapy Plan and Nutrition Goals:   Nutrition Assessments:   Nutrition Goals Re-Evaluation:   Nutrition Goals Re-Evaluation:   Nutrition Goals Discharge (Final Nutrition Goals Re-Evaluation):   Psychosocial: Target Goals: Acknowledge presence or absence of significant depression and/or stress, maximize coping skills, provide positive support system. Participant is able to verbalize types and ability to use techniques and skills needed for reducing stress and depression.  Initial Review & Psychosocial Screening: Initial  Psych Review & Screening - 11/23/19 1404      Initial Review   Current issues with  None Identified      Family Dynamics   Good Support System?  Yes   Pt lives alone but states that his friends and children are sources of support.     Barriers   Psychosocial barriers to participate in program  There are no identifiable barriers or psychosocial needs.      Screening Interventions   Interventions  Encouraged to exercise       Quality of Life Scores: Quality of Life - 11/23/19 1128      Quality of Life   Select  Quality of Life      Quality of Life Scores   Health/Function Pre  23.17 %    Socioeconomic Pre  24.5 %    Psych/Spiritual Pre  22.93 %    Family Pre  26.63 %    GLOBAL Pre  23.77 %      Scores of 19 and below usually indicate a poorer quality of life in these areas.  A difference of  2-3 points is a clinically meaningful difference.  A difference of 2-3 points in the total score of the Quality of Life Index has been associated with significant improvement in overall quality of life, self-image, physical symptoms, and general health in studies assessing change in quality of life.  PHQ-9: Recent Review Flowsheet Data    Depression screen [facility] 2/9 11/23/2019 03/03/2019   Decreased Interest 0 0   Down, Depressed, Hopeless 0 0   PHQ - 2 Score 0 0     Interpretation of Total Score  Total Score Depression Severity:  1-4 = Minimal depression, 5-9 = Mild depression, 10-14 = Moderate depression, 15-19 = Moderately severe depression, 20-27 = Severe depression   Psychosocial Evaluation and Intervention:   Psychosocial Re-Evaluation:   Psychosocial Discharge (Final Psychosocial Re-Evaluation):   Vocational Rehabilitation: Provide vocational rehab assistance to qualifying candidates.   Vocational Rehab Evaluation & Intervention: Vocational Rehab - 11/23/19 1419      Initial Vocational Rehab Evaluation & Intervention   Assessment shows need for Vocational  Rehabilitation  No       Education: Education Goals: Education classes will be provided on a weekly basis, covering required topics. Participant will state understanding/return demonstration of topics presented.  Learning Barriers/Preferences: Learning Barriers/Preferences - 11/23/19 1129      Learning Barriers/Preferences   Learning Barriers  Hearing   Memory Deficits   Learning Preferences  Audio;Skilled Demonstration;Verbal Instruction       Education Topics: Count Your Pulse:  -Group instruction provided by verbal instruction, demonstration, patient participation and written materials to support subject.  Instructors address importance of being able to find your pulse and how to count your pulse when at home without a heart monitor.  Patients get hands on experience counting their pulse with staff help and individually.   Heart Attack, Angina, and Risk Factor Modification:  -Group instruction provided by verbal instruction, video, and written materials to support subject.  Instructors address signs and symptoms of angina and heart attacks.    Also discuss risk factors for heart disease and how to make changes to improve heart health risk factors.   Functional Fitness:  -Group instruction provided by verbal instruction, demonstration, patient participation, and written materials to support subject.  Instructors address safety measures for doing things around the house.  Discuss how to get up and down off the floor, how to pick things up properly, how to safely get out of a chair without assistance, and balance training.   Meditation and Mindfulness:  -Group instruction provided by verbal instruction, patient participation, and written materials to support subject.  Instructor addresses importance of mindfulness and meditation practice to help reduce stress and improve awareness.  Instructor also leads participants through a meditation exercise.    Stretching for Flexibility and  Mobility:  -Group instruction provided by verbal instruction, patient participation, and written materials to support subject.  Instructors lead participants through series of stretches that are designed to increase flexibility thus improving mobility.  These stretches are additional exercise for major muscle groups that are typically performed during regular warm up and cool down.   Hands Only CPR:  -Group verbal, video, and participation provides a basic overview of AHA guidelines for community CPR. Role-play of emergencies allow participants the opportunity to practice calling for help and chest compression technique with discussion of AED use.   Hypertension: -Group verbal and written instruction that provides a basic overview of hypertension including the most recent diagnostic guidelines, risk factor reduction with self-care instructions and medication management.    Nutrition I class: Heart Healthy Eating:  -Group instruction provided by PowerPoint slides, verbal discussion, and written materials to support subject matter. The instructor gives an explanation and review of the Therapeutic Lifestyle Changes diet recommendations, which includes a discussion on lipid goals, dietary fat, sodium, fiber, plant stanol/sterol esters, sugar, and the components of a well-balanced, healthy diet.   Nutrition II class: Lifestyle Skills:  -Group instruction provided by PowerPoint slides, verbal discussion, and written materials to support subject matter. The instructor gives an explanation and review of label reading, grocery shopping for heart health, heart healthy recipe modifications, and ways to make healthier choices when eating out.   Diabetes Question & Answer:  -Group instruction provided by PowerPoint slides, verbal discussion, and written materials to support subject matter. The instructor gives an explanation and review of diabetes co-morbidities, pre- and post-prandial blood glucose goals,  pre-exercise blood glucose goals, signs, symptoms, and treatment of hypoglycemia and hyperglycemia, and foot care basics.   Diabetes Blitz:  -Group instruction provided by PowerPoint slides, verbal discussion, and written materials to support subject matter. The instructor gives an explanation and review of the physiology behind type 1 and type 2 diabetes, diabetes medications and rational behind using different medications, pre- and post-prandial blood glucose recommendations and Hemoglobin A1c goals, diabetes diet, and exercise including blood glucose guidelines for exercising safely.    Portion Distortion:  -Group instruction provided by PowerPoint slides, verbal discussion, written materials, and food models to support subject matter. The instructor gives an explanation of serving size versus portion size, changes in portions sizes over the last 20 years, and what consists of a serving from each food group.   Stress Management:  -Group instruction provided by verbal instruction, video, and written materials to support subject matter.  Instructors review role of stress in heart disease and how to cope with stress positively.     Exercising on Your Own:  -Group instruction provided by verbal instruction, power point, and written materials to support subject.  Instructors discuss benefits of exercise, components of exercise, frequency and intensity of exercise, and end points for exercise.  Also discuss use of nitroglycerin and activating EMS.  Review options of places to exercise outside of rehab.  Review guidelines for sex with heart disease.   Cardiac Drugs I:  -Group instruction provided by verbal instruction and written materials to support subject.  Instructor reviews cardiac drug classes: antiplatelets, anticoagulants, beta blockers, and statins.  Instructor discusses reasons, side effects, and lifestyle considerations for each drug class.   Cardiac Drugs II:  -Group instruction  provided by verbal instruction and written materials to support subject.  Instructor reviews cardiac drug classes: angiotensin converting enzyme inhibitors (ACE-I), angiotensin II receptor blockers (ARBs), nitrates, and calcium channel blockers.  Instructor discusses reasons, side effects, and lifestyle considerations for each drug class.   Anatomy and  Physiology of the Circulatory System:  Group verbal and written instruction and models provide basic cardiac anatomy and physiology, with the coronary electrical and arterial systems. Review of: AMI, Angina, Valve disease, Heart Failure, Peripheral Artery Disease, Cardiac Arrhythmia, Pacemakers, and the ICD.   Other Education:  -Group or individual verbal, written, or video instructions that support the educational goals of the cardiac rehab program.   Holiday Eating Survival Tips:  -Group instruction provided by PowerPoint slides, verbal discussion, and written materials to support subject matter. The instructor gives patients tips, tricks, and techniques to help them not only survive but enjoy the holidays despite the onslaught of food that accompanies the holidays.   Knowledge Questionnaire Score: Knowledge Questionnaire Score - 11/23/19 1131      Knowledge Questionnaire Score   Pre Score  23/34       Core Components/Risk Factors/Patient Goals at Admission: Personal Goals and Risk Factors at Admission - 11/23/19 1131      Core Components/Risk Factors/Patient Goals on Admission    Weight Management  Yes;Weight Maintenance    Intervention  Weight Management: Develop a combined nutrition and exercise program designed to reach desired caloric intake, while maintaining appropriate intake of nutrient and fiber, sodium and fats, and appropriate energy expenditure required for the weight goal.;Weight Management: Provide education and appropriate resources to help participant work on and attain dietary goals.    Admit Weight  170 lb 10.2 oz  (77.4 kg)    Expected Outcomes  Short Term: Continue to assess and modify interventions until short term weight is achieved;Long Term: Adherence to nutrition and physical activity/exercise program aimed toward attainment of established weight goal;Weight Maintenance: Understanding of the daily nutrition guidelines, which includes 25-35% calories from fat, 7% or less cal from saturated fats, less than 200mg  cholesterol, less than 1.5gm of sodium, & 5 or more servings of fruits and vegetables daily;Understanding recommendations for meals to include 15-35% energy as protein, 25-35% energy from fat, 35-60% energy from carbohydrates, less than 200mg  of dietary cholesterol, 20-35 gm of total fiber daily;Understanding of distribution of calorie intake throughout the day with the consumption of 4-5 meals/snacks    Diabetes  Yes    Intervention  Provide education about signs/symptoms and action to take for hypo/hyperglycemia.;Provide education about proper nutrition, including hydration, and aerobic/resistive exercise prescription along with prescribed medications to achieve blood glucose in normal ranges: Fasting glucose 65-99 mg/dL    Expected Outcomes  Short Term: Participant verbalizes understanding of the signs/symptoms and immediate care of hyper/hypoglycemia, proper foot care and importance of medication, aerobic/resistive exercise and nutrition plan for blood glucose control.;Long Term: Attainment of HbA1C < 7%.    Heart Failure  Yes    Intervention  Provide a combined exercise and nutrition program that is supplemented with education, support and counseling about heart failure. Directed toward relieving symptoms such as shortness of breath, decreased exercise tolerance, and extremity edema.    Expected Outcomes  Improve functional capacity of life;Short term: Attendance in program 2-3 days a week with increased exercise capacity. Reported lower sodium intake. Reported increased fruit and vegetable intake.  Reports medication compliance.;Short term: Daily weights obtained and reported for increase. Utilizing diuretic protocols set by physician.;Long term: Adoption of self-care skills and reduction of barriers for early signs and symptoms recognition and intervention leading to self-care maintenance.    Hypertension  Yes    Intervention  Provide education on lifestyle modifcations including regular physical activity/exercise, weight management, moderate sodium restriction and increased consumption of fresh  fruit, vegetables, and low fat dairy, alcohol moderation, and smoking cessation.;Monitor prescription use compliance.    Expected Outcomes  Short Term: Continued assessment and intervention until BP is < 140/62mm HG in hypertensive participants. < 130/110mm HG in hypertensive participants with diabetes, heart failure or chronic kidney disease.;Long Term: Maintenance of blood pressure at goal levels.    Lipids  Yes    Intervention  Provide education and support for participant on nutrition & aerobic/resistive exercise along with prescribed medications to achieve LDL 70mg , HDL >40mg .    Expected Outcomes  Short Term: Participant states understanding of desired cholesterol values and is compliant with medications prescribed. Participant is following exercise prescription and nutrition guidelines.;Long Term: Cholesterol controlled with medications as prescribed, with individualized exercise RX and with personalized nutrition plan. Value goals: LDL < 70mg , HDL > 40 mg.       Core Components/Risk Factors/Patient Goals Review:    Core Components/Risk Factors/Patient Goals at Discharge (Final Review):    ITP Comments: ITP Comments    Row Name 11/23/19 1117           ITP Comments  Dr. Fransico Him, Medical Director          Comments: Patient attended orientation on 11/23/2019 to review rules and guidelines for program.  Completed 6 minute walk test, Intitial ITP, and exercise prescription.  VSS.  Telemetry-SR with PAC's.  Asymptomatic. Safety measures and social distancing in place per CDC guidelines.

## 2019-11-27 ENCOUNTER — Encounter (HOSPITAL_COMMUNITY)
Admission: RE | Admit: 2019-11-27 | Discharge: 2019-11-27 | Disposition: A | Discharge status: Home / Self Care | Payer: Medicare Other | Source: Ambulatory Visit | Attending: Cardiovascular Disease | Admitting: Cardiovascular Disease

## 2019-11-27 ENCOUNTER — Other Ambulatory Visit: Payer: Self-pay

## 2019-11-27 DIAGNOSIS — I214 Non-ST elevation (NSTEMI) myocardial infarction: Secondary | ICD-10-CM

## 2019-11-27 DIAGNOSIS — Z955 Presence of coronary angioplasty implant and graft: Secondary | ICD-10-CM

## 2019-11-27 NOTE — Progress Notes (Addendum)
[name] [name] has decided to cancel his appointments for [facility].  CR will be closing for in-person exercise starting 12/30 and was offered the option of virtual rehab.  [name] [name] wishes to only participate in-person and would like to cancel his appointments.  He did not participate in today's exercise session.

## 2019-11-29 ENCOUNTER — Encounter (HOSPITAL_COMMUNITY): Payer: Medicare Other

## 2019-12-04 ENCOUNTER — Encounter (HOSPITAL_COMMUNITY): Payer: Medicare Other

## 2019-12-04 ENCOUNTER — Other Ambulatory Visit: Payer: Self-pay

## 2019-12-04 ENCOUNTER — Ambulatory Visit (HOSPITAL_COMMUNITY)
Admission: RE | Admit: 2019-12-04 | Discharge: 2019-12-04 | Disposition: A | Discharge status: Home / Self Care | Payer: Medicare Other | Source: Ambulatory Visit | Attending: Cardiology | Admitting: Cardiology

## 2019-12-04 ENCOUNTER — Encounter (HOSPITAL_COMMUNITY): Payer: Self-pay | Admitting: Cardiology

## 2019-12-04 VITALS — BP 164/90 | HR 78 | Wt 168.8 lb

## 2019-12-04 DIAGNOSIS — F17210 Nicotine dependence, cigarettes, uncomplicated: Secondary | ICD-10-CM | POA: Insufficient documentation

## 2019-12-04 DIAGNOSIS — E119 Type 2 diabetes mellitus without complications: Secondary | ICD-10-CM | POA: Diagnosis not present

## 2019-12-04 DIAGNOSIS — Z951 Presence of aortocoronary bypass graft: Secondary | ICD-10-CM | POA: Insufficient documentation

## 2019-12-04 DIAGNOSIS — I2510 Atherosclerotic heart disease of native coronary artery without angina pectoris: Secondary | ICD-10-CM | POA: Diagnosis not present

## 2019-12-04 DIAGNOSIS — Z955 Presence of coronary angioplasty implant and graft: Secondary | ICD-10-CM | POA: Insufficient documentation

## 2019-12-04 DIAGNOSIS — E785 Hyperlipidemia, unspecified: Secondary | ICD-10-CM | POA: Diagnosis not present

## 2019-12-04 DIAGNOSIS — Z952 Presence of prosthetic heart valve: Secondary | ICD-10-CM

## 2019-12-04 DIAGNOSIS — I252 Old myocardial infarction: Secondary | ICD-10-CM | POA: Diagnosis not present

## 2019-12-04 DIAGNOSIS — Z7901 Long term (current) use of anticoagulants: Secondary | ICD-10-CM | POA: Insufficient documentation

## 2019-12-04 DIAGNOSIS — I739 Peripheral vascular disease, unspecified: Secondary | ICD-10-CM

## 2019-12-04 DIAGNOSIS — Z79899 Other long term (current) drug therapy: Secondary | ICD-10-CM | POA: Insufficient documentation

## 2019-12-04 DIAGNOSIS — Z7902 Long term (current) use of antithrombotics/antiplatelets: Secondary | ICD-10-CM | POA: Insufficient documentation

## 2019-12-04 DIAGNOSIS — I472 Ventricular tachycardia: Secondary | ICD-10-CM | POA: Insufficient documentation

## 2019-12-04 DIAGNOSIS — Z833 Family history of diabetes mellitus: Secondary | ICD-10-CM | POA: Diagnosis not present

## 2019-12-04 DIAGNOSIS — I480 Paroxysmal atrial fibrillation: Secondary | ICD-10-CM | POA: Diagnosis not present

## 2019-12-04 DIAGNOSIS — I110 Hypertensive heart disease with heart failure: Secondary | ICD-10-CM | POA: Insufficient documentation

## 2019-12-04 DIAGNOSIS — I5022 Chronic systolic (congestive) heart failure: Secondary | ICD-10-CM | POA: Diagnosis present

## 2019-12-04 DIAGNOSIS — I701 Atherosclerosis of renal artery: Secondary | ICD-10-CM | POA: Insufficient documentation

## 2019-12-04 DIAGNOSIS — I491 Atrial premature depolarization: Secondary | ICD-10-CM | POA: Insufficient documentation

## 2019-12-04 DIAGNOSIS — Z7984 Long term (current) use of oral hypoglycemic drugs: Secondary | ICD-10-CM | POA: Insufficient documentation

## 2019-12-04 DIAGNOSIS — Z8673 Personal history of transient ischemic attack (TIA), and cerebral infarction without residual deficits: Secondary | ICD-10-CM | POA: Insufficient documentation

## 2019-12-04 DIAGNOSIS — Z8249 Family history of ischemic heart disease and other diseases of the circulatory system: Secondary | ICD-10-CM | POA: Insufficient documentation

## 2019-12-04 DIAGNOSIS — I255 Ischemic cardiomyopathy: Secondary | ICD-10-CM | POA: Diagnosis not present

## 2019-12-04 LAB — LIPID PANEL
Cholesterol: 172 mg/dL (ref 0–200)
HDL: 80 mg/dL (ref >40)
LDL Cholesterol: 56 mg/dL (ref 0–99)
Total CHOL/HDL Ratio: 2.2 RATIO
Triglycerides: 179 mg/dL — ABNORMAL HIGH (ref <150)
VLDL: 36 mg/dL (ref 0–40)

## 2019-12-04 LAB — PROTIME-INR
INR: 1.9 — ABNORMAL HIGH (ref 0.8–1.2)
Prothrombin Time: 21.5 seconds — ABNORMAL HIGH (ref 11.4–15.2)

## 2019-12-04 LAB — COMPREHENSIVE METABOLIC PANEL
ALT: 33 U/L (ref 0–44)
AST: 34 U/L (ref 15–41)
Albumin: 4.1 g/dL (ref 3.5–5.0)
Alkaline Phosphatase: 75 U/L (ref 38–126)
Anion gap: 12 (ref 5–15)
BUN: 24 mg/dL — ABNORMAL HIGH (ref 8–23)
CO2: 28 mmol/L (ref 22–32)
Calcium: 9.2 mg/dL (ref 8.9–10.3)
Chloride: 96 mmol/L — ABNORMAL LOW (ref 98–111)
Creatinine, Ser: 1.3 mg/dL — ABNORMAL HIGH (ref 0.61–1.24)
GFR calc Af Amer: >60 mL/min (ref >60)
GFR calc non Af Amer: 54 mL/min — ABNORMAL LOW (ref >60)
Glucose, Bld: 238 mg/dL — ABNORMAL HIGH (ref 70–99)
Potassium: 4.3 mmol/L (ref 3.5–5.1)
Sodium: 136 mmol/L (ref 135–145)
Total Bilirubin: 0.8 mg/dL (ref 0.3–1.2)
Total Protein: 7 g/dL (ref 6.5–8.1)

## 2019-12-04 LAB — CBC
HCT: 48.3 % (ref 39.0–52.0)
Hemoglobin: 15.4 g/dL (ref 13.0–17.0)
MCH: 26.8 pg (ref 26.0–34.0)
MCHC: 31.9 g/dL (ref 30.0–36.0)
MCV: 84 fL (ref 80.0–100.0)
Platelets: 228 10*3/uL (ref 150–400)
RBC: 5.75 MIL/uL (ref 4.22–5.81)
RDW: 26.7 % — ABNORMAL HIGH (ref 11.5–15.5)
WBC: 7.8 10*3/uL (ref 4.0–10.5)
nRBC: 0 % (ref 0.0–0.2)

## 2019-12-04 LAB — TSH: TSH: 2.301 u[IU]/mL (ref 0.350–4.500)

## 2019-12-04 MED ORDER — SACUBITRIL-VALSARTAN 24-26 MG PO TABS: 1.0000 | ORAL_TABLET | Freq: Two times a day (BID) | ORAL | 5 refills | Status: DC

## 2019-12-04 MED ORDER — FUROSEMIDE 20 MG PO TABS: 20.0000 mg | ORAL_TABLET | Freq: Every day | ORAL | 3 refills | Status: DC

## 2019-12-04 NOTE — Patient Instructions (Addendum)
STOP Losartan  START Entresto 24/26mg  (1 tab) twice a day  DECREASE Lasix to 20mg  (1 tab) daily  Labs today and repeat in 10 days We will only contact you if something comes back abnormal or we need to make some changes. Otherwise no news is good news!  Your physician recommends that you schedule a follow-up appointment in: 6 weeks with Dr Aundra Dubin  At the [facility], you and your health needs are our priority. As part of our continuing mission to provide you with exceptional heart care, we have created designated Provider Care Teams. These Care Teams include your primary Cardiologist (physician) and Advanced Practice Providers (APPs- Physician Assistants and Nurse Practitioners) who all work together to provide you with the care you need, when you need it.   You may see any of the following providers on your designated Care Team at your next follow up: [name] Dr Glori Bickers . Dr Loralie Champagne . Darrick Grinder, NP . Lyda Jester, PA . Audry Riles, PharmD   Please be sure to bring in all your medications bottles to every appointment.

## 2019-12-05 ENCOUNTER — Ambulatory Visit (INDEPENDENT_AMBULATORY_CARE_PROVIDER_SITE_OTHER): Payer: Self-pay | Admitting: Internal Medicine

## 2019-12-05 ENCOUNTER — Other Ambulatory Visit (HOSPITAL_COMMUNITY): Payer: Self-pay | Admitting: Adult Health

## 2019-12-05 DIAGNOSIS — I359 Nonrheumatic aortic valve disorder, unspecified: Secondary | ICD-10-CM

## 2019-12-05 DIAGNOSIS — Z5181 Encounter for therapeutic drug level monitoring: Secondary | ICD-10-CM

## 2019-12-05 DIAGNOSIS — G459 Transient cerebral ischemic attack, unspecified: Secondary | ICD-10-CM

## 2019-12-05 NOTE — Patient Instructions (Signed)
Description   Called pt and advised him to take 7.5mg  of warfarin today and tomorrow, then continue taking 5mg  daily except for 10mg  on Thursdays.  Recheck INR in 2 weeks. Call coumadin Clinic for any changes in medications or upcoming procedures. [phone]

## 2019-12-05 NOTE — Progress Notes (Signed)
PCP: Shon Baton, MD HF Cardiology: Dr. Velora Heckler a 73 y.o.malewith a history of CAD s/p CABG, mechanical AVR on coumadin, extensive PAD, DM, HTN, HLD, ITP, TIA, and recurrent GI bleed. Note, requires lovenox bridging off heparin.  Dr. Gwenlyn Found follows him for extensive PAD. He has had an aortobifem bypass in 2016 with follow up iliac stenting and femoral endarterectomy. In 2017, he had subsequent ostial and mid right SFA intervention. Hx of left renal artery stenting in 2016 with repeat intervention on left for 75% in-stent restenosis, with progression of disease on the right renal artery showing 60% stenosis on duplex 07/12/19. Echo 09/17/19 with normal EF of 60-65% and normal AVR function; chronic diastolic heart failure.   Admitted in 10/20 with chest pain. He underwent heart cath 09/18/19 that showed 99% left main stenosis and patent LIMA to occluded LAD and a patent sequential vein to the PDA and PLA of an occluded dominant right. His left main stenosis jeopardized the moderately large ramus and high first marginal and nondominant Cx which has 90% mid AV groove Cx stenosis. Aggressive medical therapy was recommended initially.  He was readmitted in 10/20 with CHF and chest pain. Hospital course complicated by A flutter/low output heart failure.  S/P successful orbital atherectomy, PTCA, and stenting of protected left main with a 4.0x15 mm resolute onyx DES 11/3. LIMA-LAD patent. SVG-PDA patent. ECHO this admission showed EF 30-35%, mechanical AoV ok. TEE 11/9 w/ improved EF, 40-45%. Placed on milrinone initially with low co-ox and later weaned off. He was cardioverted from atrial flutter back to NSR.   He returns today for CHF followup.  He is in NSR, no palpitations.  No BRBPR/melena. No chest pain.  No significant exertional dyspnea.  Main limitation is right calf claudication after walking about 1/4 mile.  No lightheadedness.  Taking all meds.    ECG (personally  reviewed): NSR, 1st degree AVB, slight ST depression V4-V6.   Labs (11/20): K 4.3, creatinine 1.3, hgb 12.5  PMH  1. CAD:  S/P CABG (3/04) with LIMA-LAD, seq SVG-PDA/PLV.  - NSTEMI 10/20 with cath showing patent LIMA-LAD and SVG-PDA/PLV but 99% LM stenosis, occluded LAD, moderate ramus and high OM1 jeopardized, also 90% mid AV groove LCx stenosis.  Occluded RCA.  Patient ultimately had orbital atherectomy and stenting of left main with a 4.0 x 15 mm Resolute Onyx DES.  2. Mechanical aortic valve: TEE 11/20 showed stable-appearing mechanical valve, mean gradient 9 mmHg.  3. PAD: Aortobifemoral bypass in 2016 with follow up iliac stenting and femoral endarterectomy. In 2017, he had subsequent ostial and mid right SFA intervention. - Angiography 10/20 with 80% in-stent restenosis in proximal right SFA.  4. Type 2 diabetes.  5. HTN 6. Hyperlipidemia 7. H/o ITP  8. TIA  9. Chronic Systolic HF: Ischemic cardiomyopathy.  - Echo (10/20): EF 30-35%, mechanical AoV ok.  - TEE (11/20): w/ improved EF, 40-45%. Mechanical aortic valve with mean gradient 9 mmHg, normal RV.  10. Atrial fibrillation: Paroxysmal.  11. Renal artery stenosis: Hx of left renal artery stenting in 2016 with repeat intervention on left for 75% in-stent restenosis, with progression of disease on the right renal artery showing 60% stenosis on duplex 07/12/19.  ROS: All systems negative except as listed in HPI, PMH and Problem List.  Social History   Socioeconomic History  . Marital status: Divorced    Spouse name: Not on file  . Number of children: 3  . Years of education: Not on  file  . Highest education level: Not on file  Occupational History  . Occupation: Retired  Tobacco Use  . Smoking status: Light Tobacco Smoker    Years: 30.00    Types: Cigarettes, Cigars    Last attempt to quit: 12/15/1993    Years since quitting: 25.9  . Smokeless tobacco: Never Used  . Tobacco comment: occasional cigar  Substance and Sexual  Activity  . Alcohol use: Yes    Alcohol/week: 16.0 standard drinks    Types: 1 Cans of beer, 1 Shots of liquor, 14 Standard drinks or equivalent per week    Comment: drinks 2 martini's a night (2 shots in each)  . Drug use: No  . Sexual activity: Yes  Other Topics Concern  . Not on file  Social History Narrative   Tries to remain active.  Frequent golfer but claudication limits this.   Social Determinants of Health   Financial Resource Strain:   . Difficulty of Paying Living Expenses: Not on file  Food Insecurity:   . Worried About Charity fundraiser in the Last Year: Not on file  . Ran Out of Food in the Last Year: Not on file  Transportation Needs:   . Lack of Transportation (Medical): Not on file  . Lack of Transportation (Non-Medical): Not on file  Physical Activity:   . Days of Exercise per Week: Not on file  . Minutes of Exercise per Session: Not on file  Stress:   . Feeling of Stress : Not on file  Social Connections:   . Frequency of Communication with Friends and Family: Not on file  . Frequency of Social Gatherings with Friends and Family: Not on file  . Attends Religious Services: Not on file  . Active Member of Clubs or Organizations: Not on file  . Attends Archivist Meetings: Not on file  . Marital Status: Not on file  Intimate Partner Violence:   . Fear of Current or Ex-Partner: Not on file  . Emotionally Abused: Not on file  . Physically Abused: Not on file  . Sexually Abused: Not on file   Family History  Problem Relation Age of Onset  . Coronary artery disease Mother        bypass surgery - deceased  . Heart disease Father        murmur, valve replacement - deceased  . Breast cancer Sister   . Diabetes Other        grandmother  . Diabetes Paternal Grandmother   . Diabetes Paternal Aunt   . Colon cancer Neg Hx   . Colon polyps Neg Hx   . Esophageal cancer Neg Hx   . Rectal cancer Neg Hx   . Stomach cancer Neg Hx     Current  Outpatient Medications  Medication Sig Dispense Refill  . amiodarone (PACERONE) 200 MG tablet Take 1 tablet (200 mg total) by mouth daily. 45 tablet 6  . amoxicillin (AMOXIL) 500 MG capsule TAKE 4 CAPSULES BY MOUTH 30 TO 60 MINTUES BEFORE APPOINTMENT.    [name] atorvastatin (LIPITOR) 80 MG tablet Take 80 mg by mouth daily.     . canagliflozin (INVOKANA) 300 MG TABS tablet Take 300 mg by mouth daily.     . clopidogrel (PLAVIX) 75 MG tablet Take 1 tablet (75 mg total) by mouth daily. 30 tablet 6  . digoxin (LANOXIN) 0.125 MG tablet Take 1 tablet (0.125 mg total) by mouth daily. 30 tablet 6  . Evolocumab (REPATHA SURECLICK) 299  MG/ML SOAJ Inject 140 mg into the skin every 14 (fourteen) days.    . furosemide (LASIX) 20 MG tablet Take 1 tablet (20 mg total) by mouth daily. 90 tablet 3  . glyBURIDE (DIABETA) 2.5 MG tablet Take 2.5 mg by mouth 2 (two) times daily with a meal.     . nitroGLYCERIN (NITROSTAT) 0.4 MG SL tablet Place 1 tablet (0.4 mg total) under the tongue every 5 (five) minutes x 3 doses as needed for chest pain. 25 tablet 12  . pantoprazole (PROTONIX) 40 MG tablet Take 40 mg by mouth daily.    . potassium chloride SA (KLOR-CON) 20 MEQ tablet Take 1 tablet (20 mEq total) by mouth daily. 30 tablet 6  . spironolactone (ALDACTONE) 25 MG tablet Take 1 tablet (25 mg total) by mouth daily. 30 tablet 6  . warfarin (COUMADIN) 5 MG tablet Take as directed by the coumadin clinic    . sacubitril-valsartan (ENTRESTO) 24-26 MG Take 1 tablet by mouth 2 (two) times daily. 60 tablet 5   No current facility-administered medications for this encounter.    Vitals:   12/04/19 1533  BP: (!) 164/90  Pulse: 78  SpO2: 97%  Weight: 76.6 kg (168 lb 12.8 oz)   Wt Readings from Last 3 Encounters:  12/04/19 76.6 kg (168 lb 12.8 oz)  11/23/19 77.4 kg (170 lb 10.2 oz)  10/30/19 75.4 kg (166 lb 3.2 oz)    PHYSICAL EXAM: General: NAD Neck: No JVD, no thyromegaly or thyroid nodule.  Lungs: Clear to  auscultation bilaterally with normal respiratory effort. CV: Nondisplaced PMI.  Heart regular S1/S2 with mechanical S2, no S3/S4,1/6 SEM RUSB.  No peripheral edema.  No carotid bruit.  Normal pedal pulses.  Abdomen: Soft, nontender, no hepatosplenomegaly, no distention.  Skin: Intact without lesions or rashes.  Neurologic: Alert and oriented x 3.  Psych: Normal affect. Extremities: No clubbing or cyanosis.  HEENT: Normal.    ASSESSMENT & PLAN: 1. Chronic Systolic CHF: Ischemic cardiomyopathy. Echo 10/06/19 showed EF 30-35% and wall motion abnormalities, prior echo earlier in 10/20 showed EF 50-55%. Cath 10/20 showed jeopardized ramus and LCx territory (99% distal left main, occluded LAD, patent LIMA-LAD). Concerned that ischemia in this territory triggered CHF, worsening of LV function.  Now s/p DES to left main, TEE in 11/20 after intervention showed EF higher at 40-45%.  NYHA class II symptoms, now volume overloaded on exam.  - Stop losartan, start Entresto 24/26 bid (BP elevated today).  BMET today and again in 10 days.  - Decrease Lasix to 20 mg daily.  - Continue spironolactone and digoxin, check digoxin level today.  - Add Coreg as next step.  - Echo in 2/20. Hopefully will be out of ICD range.  2. CAD: Complicated disease, coronary angiography 10/20 showed patent SVG-RCA territory and patent LIMA-LAD. The proximal LAD was occluded and there was 99% distal left main stenosis. This left the ramus and LCx in jeopardy. He was not a good candidate for protected PCI =>cannot place Impella with mechanical aortic valve and peripheral vascular disease likely precludes a femoral IABP. It was decided to try to manage him medically. However, he returned with progressive chest pain episodes and NSTEMI, hs-TnI up to 935. S/p successful orbital atherectomy and stenting of the left main with a 4.0x15 mm Resolute Onyx DES 10/10/19.No chest pain.   - Off ASA, continue Plavix for 1 year.  - Continue  warfarin for mechanical valve.    - Continue atorvastatin 80 daily +  Repatha.  Check lipids today.   3. Mechanical aortic valve: Stable on 11/20 TEE. Crisp mechanical sounds on exam - Off ASA given Plavix use.  - Continue warfarin and will aim for INR 2.5-3 while on Plavix.  4. NSVT: Noted 10/20 admission, on amiodarone.  5. H/o GI bleeding: Continue PPI for GI protection. 6. PAD: Patient with aorto-bifemoral bypass, h/o iliac stenting, h/o femoral endarterectomy, PCI to right SFA in 2017.  Angiography in 10/20 with 80% ISR in right SFA.  He has significant claudication.  - Following with Dr. Gwenlyn Found, reassessing need for intervention at next appt.  I encouraged him to try to walk through the pain.  7. H/o renal artery stenosis.  8. DM2: He is on SGLT2 inhibitor (canagliflozin).  10. Atrial fibrillation: Paroxysmal.  TEE-guided DCCV in 11/20, he remains in NSR.  - Continue amiodarone 200 mg dailiy for now, but eventually would like him evaluated by EP for possible ablation. Check LFTs, TSH today.  He will need a regular eye exam.   -Continue coumadin.   Followup in 6 wks.   Loralie Champagne  12/05/2019

## 2019-12-06 ENCOUNTER — Encounter (HOSPITAL_COMMUNITY): Payer: Medicare Other

## 2019-12-11 ENCOUNTER — Encounter (HOSPITAL_COMMUNITY): Payer: Medicare Other

## 2019-12-13 ENCOUNTER — Encounter (HOSPITAL_COMMUNITY): Payer: Medicare Other

## 2019-12-14 ENCOUNTER — Other Ambulatory Visit: Payer: Self-pay

## 2019-12-14 ENCOUNTER — Ambulatory Visit (HOSPITAL_COMMUNITY)
Admission: RE | Admit: 2019-12-14 | Discharge: 2019-12-14 | Disposition: A | Discharge status: Home / Self Care | Payer: Medicare Other | Source: Ambulatory Visit | Attending: Cardiology | Admitting: Cardiology

## 2019-12-14 DIAGNOSIS — I5022 Chronic systolic (congestive) heart failure: Secondary | ICD-10-CM | POA: Insufficient documentation

## 2019-12-14 LAB — BASIC METABOLIC PANEL
Anion gap: 12 (ref 5–15)
BUN: 35 mg/dL — ABNORMAL HIGH (ref 8–23)
CO2: 27 mmol/L (ref 22–32)
Calcium: 9 mg/dL (ref 8.9–10.3)
Chloride: 99 mmol/L (ref 98–111)
Creatinine, Ser: 1.45 mg/dL — ABNORMAL HIGH (ref 0.61–1.24)
GFR calc Af Amer: 55 mL/min — ABNORMAL LOW (ref >60)
GFR calc non Af Amer: 47 mL/min — ABNORMAL LOW (ref >60)
Glucose, Bld: 263 mg/dL — ABNORMAL HIGH (ref 70–99)
Potassium: 4.8 mmol/L (ref 3.5–5.1)
Sodium: 138 mmol/L (ref 135–145)

## 2019-12-15 ENCOUNTER — Encounter (HOSPITAL_COMMUNITY): Payer: Medicare Other

## 2019-12-18 ENCOUNTER — Encounter (HOSPITAL_COMMUNITY): Payer: Medicare Other

## 2019-12-19 ENCOUNTER — Ambulatory Visit (INDEPENDENT_AMBULATORY_CARE_PROVIDER_SITE_OTHER): Payer: Medicare Other | Admitting: Pharmacist

## 2019-12-19 ENCOUNTER — Other Ambulatory Visit: Payer: Self-pay

## 2019-12-19 DIAGNOSIS — G459 Transient cerebral ischemic attack, unspecified: Secondary | ICD-10-CM

## 2019-12-19 DIAGNOSIS — Z5181 Encounter for therapeutic drug level monitoring: Secondary | ICD-10-CM

## 2019-12-19 DIAGNOSIS — I359 Nonrheumatic aortic valve disorder, unspecified: Secondary | ICD-10-CM | POA: Diagnosis not present

## 2019-12-19 LAB — POCT INR: INR: 3 (ref 2.0–3.0)

## 2019-12-19 NOTE — Patient Instructions (Signed)
Description   Continue taking 5mg  daily except for 10mg  on Thursdays.  Recheck INR in 4 weeks. Call coumadin Clinic for any changes in medications or upcoming procedures. [phone]

## 2019-12-20 ENCOUNTER — Encounter (HOSPITAL_COMMUNITY): Payer: Medicare Other

## 2019-12-22 ENCOUNTER — Encounter (HOSPITAL_COMMUNITY): Payer: Medicare Other

## 2019-12-25 ENCOUNTER — Encounter (HOSPITAL_COMMUNITY): Payer: Medicare Other

## 2019-12-27 ENCOUNTER — Encounter (HOSPITAL_COMMUNITY): Payer: Medicare Other

## 2019-12-28 ENCOUNTER — Ambulatory Visit: Payer: Medicare Other | Attending: Internal Medicine

## 2019-12-28 ENCOUNTER — Other Ambulatory Visit: Payer: Self-pay

## 2019-12-28 DIAGNOSIS — Z23 Encounter for immunization: Secondary | ICD-10-CM | POA: Insufficient documentation

## 2019-12-28 NOTE — Progress Notes (Signed)
   Covid-19 Vaccination Clinic  Name:  [name] [name]    MRN: [id number] DOB: [birth year]  12/28/2019  [name] [name] was observed post Covid-19 immunization for 15 minutes without incidence. He was provided with Vaccine Information Sheet and instruction to access the V-Safe system.   [name] [name] was instructed to call 911 with any severe reactions post vaccine: [name] Difficulty breathing  . Swelling of your face and throat  . A fast heartbeat  . A bad rash all over your body  . Dizziness and weakness    Immunizations Administered    Name Date Dose VIS Date Route   Pfizer COVID-19 Vaccine 12/28/2019  6:05 PM 0.3 mL 11/17/2019 Intramuscular   Manufacturer: [place]   Lot: [id number]   [place]: [id number]

## 2019-12-29 ENCOUNTER — Encounter (HOSPITAL_COMMUNITY): Payer: Medicare Other

## 2020-01-01 ENCOUNTER — Encounter (HOSPITAL_COMMUNITY): Payer: Medicare Other

## 2020-01-03 ENCOUNTER — Encounter (HOSPITAL_COMMUNITY): Payer: Medicare Other

## 2020-01-05 ENCOUNTER — Telehealth: Payer: Self-pay

## 2020-01-05 ENCOUNTER — Encounter (HOSPITAL_COMMUNITY): Payer: Medicare Other

## 2020-01-05 NOTE — Telephone Encounter (Signed)
Returned patients call reference interest in PREP. Was initially referred to Cardiac Rehab pursuing PREP because he wants in person.  Patient is interested and can do a class starting in Feb. Will return call and schedule intake for PREP once start date confirmed.

## 2020-01-07 DIAGNOSIS — S61209A Unspecified open wound of unspecified finger without damage to nail, initial encounter: Secondary | ICD-10-CM | POA: Diagnosis not present

## 2020-01-08 ENCOUNTER — Encounter (HOSPITAL_COMMUNITY): Payer: Medicare Other

## 2020-01-09 ENCOUNTER — Telehealth: Payer: Self-pay

## 2020-01-09 NOTE — Telephone Encounter (Signed)
Call placed to patient reference start date of next PREP class of 01/30/2020. Class will meet every tues/thur at 1pm-215pm x 12wks.  Intake scheduled for 2/10 at 230pm.

## 2020-01-10 ENCOUNTER — Encounter (HOSPITAL_COMMUNITY): Payer: Medicare Other

## 2020-01-12 ENCOUNTER — Encounter (HOSPITAL_COMMUNITY): Payer: Medicare Other

## 2020-01-12 DIAGNOSIS — H33311 Horseshoe tear of retina without detachment, right eye: Secondary | ICD-10-CM | POA: Diagnosis not present

## 2020-01-12 DIAGNOSIS — H33332 Multiple defects of retina without detachment, left eye: Secondary | ICD-10-CM | POA: Diagnosis not present

## 2020-01-15 ENCOUNTER — Encounter (HOSPITAL_COMMUNITY): Payer: Medicare Other

## 2020-01-16 ENCOUNTER — Other Ambulatory Visit: Payer: Self-pay

## 2020-01-16 ENCOUNTER — Ambulatory Visit (INDEPENDENT_AMBULATORY_CARE_PROVIDER_SITE_OTHER): Payer: Medicare Other | Admitting: *Deleted

## 2020-01-16 DIAGNOSIS — I359 Nonrheumatic aortic valve disorder, unspecified: Secondary | ICD-10-CM | POA: Diagnosis not present

## 2020-01-16 DIAGNOSIS — G459 Transient cerebral ischemic attack, unspecified: Secondary | ICD-10-CM

## 2020-01-16 DIAGNOSIS — Z5181 Encounter for therapeutic drug level monitoring: Secondary | ICD-10-CM | POA: Diagnosis not present

## 2020-01-16 LAB — POCT INR: INR: 2.3 (ref 2.0–3.0)

## 2020-01-16 NOTE — Patient Instructions (Signed)
Description   Today take an extra 2.5mg  (1/2 tablet) then continue taking 5mg  daily except for 10mg  on Thursdays.  Recheck INR in 4 weeks. Call coumadin Clinic for any changes in medications or upcoming procedures. [phone]

## 2020-01-17 ENCOUNTER — Encounter (HOSPITAL_COMMUNITY): Payer: Medicare Other

## 2020-01-18 ENCOUNTER — Other Ambulatory Visit: Payer: Self-pay | Admitting: *Deleted

## 2020-01-18 ENCOUNTER — Ambulatory Visit: Payer: Medicare Other | Attending: Internal Medicine

## 2020-01-18 DIAGNOSIS — Z23 Encounter for immunization: Secondary | ICD-10-CM | POA: Insufficient documentation

## 2020-01-18 NOTE — Progress Notes (Signed)
   Covid-19 Vaccination Clinic  Name:  Lily Kernen    MRN: [id number] DOB: 08-09-1946  01/18/2020  Mr. Lucarelli was observed post Covid-19 immunization for 15 minutes without incidence. He was provided with Vaccine Information Sheet and instruction to access the V-Safe system.   Mr. Ancheta was instructed to call 911 with any severe reactions post vaccine: [name] Difficulty breathing  . Swelling of your face and throat  . A fast heartbeat  . A bad rash all over your body  . Dizziness and weakness    Immunizations Administered    Name Date Dose VIS Date Route   Pfizer COVID-19 Vaccine 01/18/2020 10:48 AM 0.3 mL 11/17/2019 Intramuscular   Manufacturer: [place]   Lot: [id number]   [place]: [id number]

## 2020-01-19 ENCOUNTER — Encounter (HOSPITAL_COMMUNITY): Payer: Medicare Other

## 2020-01-19 NOTE — Progress Notes (Signed)
Cardiology Clinic Note   Patient Name: [name] [name] Date of Encounter: 01/22/2020  Primary Care Provider:  Shon Baton, MD Primary Cardiologist:  Jenkins Rouge, MD  Patient Profile    [name] [name] 74 year old male presents today for follow-up of his essential hypertension, coronary atherosclerosis, aortic valve, PVD, aortic stenosis, and PAF.  Past Medical History    Past Medical History:  Diagnosis Date  . Adenomatous colon polyp 09/1997  . Anemia   . Aortic stenosis    s/p st. jude mechanical AVR - Chronic Coumadin  . Blood transfusion    "related to ITP"  . Coronary artery disease    s/p cabg x 3 11/2003: lima-lad, seq vg to rpda and rpl  . Diverticulitis of colon   . Heart murmur   . Hyperlipidemia   . Hypertension   . ITP (idiopathic thrombocytopenic purpura)   . Peripheral arterial disease ([place])    a. history of aortobifemoral bypass grafting by Dr. Sherren Mocha early b. LE angiography 04/22/2015 patent aortobifem graft, DES to R SFA  . Peripheral vascular disease ([place])    s/p Left external Iliac Artery stenting and subsequent left femoral endarterectomy 02/2011- post op course complicated by wound infxn req I&D 03/2011  . Renal artery stenosis, native, bilateral (HCC)    a. bilateral renal artery stenosis by recent duplex ultrasound b. L renal artery stent 02/2015, R renal artery patent on angiogram  . Stroke ([place])   . TIA (transient ischemic attack) ~ 2013  . Type II diabetes mellitus ([place])    Past Surgical History:  Procedure Laterality Date  . ABDOMINAL AORTAGRAM N/A 12/16/2011   Procedure: ABDOMINAL Maxcine Ham;  Surgeon: Sherren Mocha, MD;  Location: [facility] CATH LAB;  Service: Cardiovascular;  Laterality: N/A;  . ANGIOPLASTY / STENTING ILIAC     Left external Iliac Artery  . AORTA - BILATERAL FEMORAL ARTERY BYPASS GRAFT  01/18/2012   Procedure: AORTA BIFEMORAL BYPASS GRAFT;  Surgeon: Curt Jews, MD;  Location: [place];  Service: Vascular;  Laterality: N/A;  .  AORTIC VALVE REPLACEMENT  ~ 2004  . CARDIAC CATHETERIZATION  11/2003   /pt report 10/01/2016  . CARDIAC VALVE REPLACEMENT  11/2003   aortic  . CARDIOVERSION N/A 10/16/2019   Procedure: CARDIOVERSION;  Surgeon: Larey Dresser, MD;  Location: [facility] ENDOSCOPY;  Service: Cardiovascular;  Laterality: N/A;  . CATARACT EXTRACTION W/ INTRAOCULAR LENS  IMPLANT, BILATERAL Bilateral   . COLONOSCOPY    . COLONOSCOPY WITH PROPOFOL N/A 09/03/2019   Procedure: COLONOSCOPY WITH PROPOFOL;  Surgeon: Doran Stabler, MD;  Location: [facility] ENDOSCOPY;  Service: Gastroenterology;  Laterality: N/A;  . CORONARY ARTERY BYPASS GRAFT  11/2003   Archie Endo 04/21/2011  . CORONARY ATHERECTOMY N/A 10/10/2019   Procedure: CORONARY ATHERECTOMY;  Surgeon: Sherren Mocha, MD;  Location: [place] CV LAB;  Service: Cardiovascular;  Laterality: N/A;  . CORONARY STENT INTERVENTION N/A 10/10/2019   Procedure: CORONARY STENT INTERVENTION;  Surgeon: Sherren Mocha, MD;  Location: [place] CV LAB;  Service: Cardiovascular;  Laterality: N/A;  . CORONARY/GRAFT ANGIOGRAPHY N/A 09/18/2019   Procedure: CORONARY/GRAFT ANGIOGRAPHY;  Surgeon: Lorretta Harp, MD;  Location: [place] CV LAB;  Service: Cardiovascular;  Laterality: N/A;  . ESOPHAGOGASTRODUODENOSCOPY (EGD) WITH PROPOFOL N/A 02/27/2019   Procedure: ESOPHAGOGASTRODUODENOSCOPY (EGD) WITH PROPOFOL;  Surgeon: Doran Stabler, MD;  Location: [place];  Service: Endoscopy;  Laterality: N/A;  . HEMOSTASIS CLIP PLACEMENT  09/03/2019   Procedure: HEMOSTASIS CLIP PLACEMENT;  Surgeon: [name],  MD;  Location: [facility] ENDOSCOPY;  Service: Gastroenterology;;  . LOWER EXTREMITY ANGIOGRAM N/A 02/21/2015   Procedure: LOWER EXTREMITY ANGIOGRAM;  Surgeon: Lorretta Harp, MD;  Location[facility] CATH LAB;  Service: Cardiovascular;  Laterality: N/A;  . PERIPHERAL VASCULAR CATHETERIZATION N/A 04/22/2015   Procedure: Lower Extremity Angiography;  Surgeon: Lorretta Harp, MD;  Location: [place] CV LAB;  Service: Cardiovascular;  Laterality: N/A;  . PERIPHERAL VASCULAR CATHETERIZATION N/A 08/24/2016   Procedure: Lower Extremity Angiography;  Surgeon: Lorretta Harp, MD;  Location: [place] CV LAB;  Service: Cardiovascular;  Laterality: N/A;  . PERIPHERAL VASCULAR CATHETERIZATION Right 10/01/2016   Procedure: Peripheral Vascular Intervention - STENT;  Surgeon: Lorretta Harp, MD;  Location: [place] CV LAB;  Service: Cardiovascular;  Laterality: Right;  Prox and MID SFA   . POLYPECTOMY    . RENAL ANGIOGRAM N/A 02/21/2015   Procedure: RENAL ANGIOGRAM;  Surgeon: Lorretta Harp, MD;  Location: [facility] CATH LAB;  Service: Cardiovascular;  Laterality: Bilateral; 6 mm x 12 mm long Herculink balloon expandable stent to the left renal artery  . RENAL ARTERY STENT Left 04/22/2015   dr berry  . SPLENECTOMY  02/2003   Archie Endo 04/21/2011  . TEE WITHOUT CARDIOVERSION N/A 10/16/2019   Procedure: TRANSESOPHAGEAL ECHOCARDIOGRAM (TEE);  Surgeon: Larey Dresser, MD;  Location: [facility] ENDOSCOPY;  Service: Cardiovascular;  Laterality: N/A;  . TEMPORARY PACEMAKER N/A 10/10/2019   Procedure: TEMPORARY PACEMAKER;  Surgeon: Sherren Mocha, MD;  Location: [place] CV LAB;  Service: Cardiovascular;  Laterality: N/A;  . TONSILLECTOMY  ~ 1952    Allergies  No Known Allergies  History of Present Illness  Mr. Mashek has past medical history of essential hypertension, unstable angina, NSTEMI, CAD status post CABG, aortic valve disorder (mechanical AVR on Coumadin,, TIA, PVD, renal artery stenosis, DM, HLD, recurrent GI bleed, requires Lovenox bridging off heparin.  He had aortofemoral bypass in 2016 with a follow-up iliac stenting and femoral endarterectomy.  In 2017 he underwent subsequent ostial and mid right SFA intervention.  He had renal artery stenting in 2016 with repeat intervention on the left for 75% in-stent restenosis, noted progression of disease on the right renal artery showing 60%  stenosis on duplex (07/2019).  His echocardiogram 09/2019 showed a normal EF 60-65%, and normal AVR function, chronic diastolic HF.  He was admitted 10/20 with chest pain.  He underwent cardiac catheterization 09/18/2019 which showed 99% left main stenosis and patent LIMA to occluded LAD and a patent SVG to the PDA and PLA.  His left main stenosis jeopardized a moderately large ramus and a high first marginal and nondominant CX which had 90% mid AV groove CX stenosis.  Medical management was recommended at that time.  He was readmitted 10/20 with CHF and chest pain.  His hospital course was complicated by a flutter/low output heart failure.  Status post successful orbital arthrectomy, PTCA, and stenting of the protected left main with DES.  LIMA-LAD patent.  SVG-PDA patent.  His echocardiogram during that admission showed EF 30-35%, mechanical AoV okay.  A TEE 10/16/2019 showed improved EF 40-45%.  He was placed on milrinone initially with a low carboxyhemoglobin and later weaned off.  He was then cardioverted from atrial flutter back to NSR.  He saw Dr. Aundra Dubin for CHF follow-up 12/04/2019.  He remained in normal sinus rhythm at that time.  He denied palpitations, chest pain, melena and had no significant exertional dyspnea.  He did report right calf claudication after walking  about a quarter of a mile.  He also denied lightheadedness and was compliant with his medications.    He presents to the clinic today and states he is able to walk about 50 yards before he has right calf claudication.  He states he is ready to get back into more regular physical activity and exercising.  However, he is limited by his claudication.  During his previous visit with Dr. Gwenlyn Found he did not want to undergo another procedure at that time due to chest pain discharged from a hospital admission.  At this time he is ready to undergo further vascular procedures.  He denies chest pain, shortness of breath, lower extremity edema,  fatigue, palpitations, melena, hematuria, hemoptysis, diaphoresis, weakness, presyncope, syncope, orthopnea, and PND.    Home Medications    Prior to Admission medications   Medication Sig Start Date End Date Taking? Authorizing Provider  amiodarone (PACERONE) 200 MG tablet Take 1 tablet (200 mg total) by mouth daily. 10/26/19   Clegg, Amy D, NP  amoxicillin (AMOXIL) 500 MG capsule TAKE 4 CAPSULES BY MOUTH 30 TO 60 MINTUES BEFORE APPOINTMENT. 06/08/19   [provider]  atorvastatin (LIPITOR) 80 MG tablet Take 80 mg by mouth daily.     [provider]  canagliflozin (INVOKANA) 300 MG TABS tablet Take 300 mg by mouth daily.     [provider]  clopidogrel (PLAVIX) 75 MG tablet Take 1 tablet (75 mg total) by mouth daily. 10/18/19   Clegg, Amy D, NP  digoxin (LANOXIN) 0.125 MG tablet TAKE 1 TABLET BY MOUTH DAILY 12/05/19   Larey Dresser, MD  Evolocumab (REPATHA SURECLICK) 726 MG/ML SOAJ Inject 140 mg into the skin every 14 (fourteen) days.    [provider]  furosemide (LASIX) 20 MG tablet Take 1 tablet (20 mg total) by mouth daily. 12/04/19   Larey Dresser, MD  glyBURIDE (DIABETA) 2.5 MG tablet Take 2.5 mg by mouth 2 (two) times daily with a meal.     [provider]  nitroGLYCERIN (NITROSTAT) 0.4 MG SL tablet Place 1 tablet (0.4 mg total) under the tongue every 5 (five) minutes x 3 doses as needed for chest pain. 09/19/19   Daune Perch, NP  pantoprazole (PROTONIX) 40 MG tablet Take 40 mg by mouth daily. 09/22/19   [provider]  potassium chloride SA (KLOR-CON) 20 MEQ tablet Take 1 tablet (20 mEq total) by mouth daily. 10/17/19   Clegg, Amy D, NP  sacubitril-valsartan (ENTRESTO) 24-26 MG Take 1 tablet by mouth 2 (two) times daily. 12/04/19   Larey Dresser, MD  spironolactone (ALDACTONE) 25 MG tablet Take 1 tablet (25 mg total) by mouth daily. 10/18/19   Clegg, Amy D, NP  warfarin (COUMADIN) 5 MG tablet Take as directed by the  coumadin clinic    [provider]    Family History    Family History  Problem Relation Age of Onset  . Coronary artery disease Mother        bypass surgery - deceased  . Heart disease Father        murmur, valve replacement - deceased  . Breast cancer Sister   . Diabetes Other        grandmother  . Diabetes Paternal Grandmother   . Diabetes Paternal Aunt   . Colon cancer Neg Hx   . Colon polyps Neg Hx   . Esophageal cancer Neg Hx   . Rectal cancer Neg Hx   . Stomach cancer Neg  Hx    He indicated that his mother is deceased. He indicated that his father is deceased. He indicated that the status of his sister is unknown. He indicated that his paternal grandmother is deceased. He indicated that his paternal aunt is deceased. He indicated that the status of his neg hx is unknown. He indicated that the status of his other is unknown.  Social History    Social History   Socioeconomic History  . Marital status: Divorced    Spouse name: Not on file  . Number of children: 3  . Years of education: Not on file  . Highest education level: Not on file  Occupational History  . Occupation: Retired  Tobacco Use  . Smoking status: Light Tobacco Smoker    Years: 30.00    Types: Cigarettes, Cigars    Last attempt to quit: 12/15/1993    Years since quitting: 26.1  . Smokeless tobacco: Never Used  . Tobacco comment: occasional cigar  Substance and Sexual Activity  . Alcohol use: Yes    Alcohol/week: 16.0 standard drinks    Types: 1 Cans of beer, 1 Shots of liquor, 14 Standard drinks or equivalent per week    Comment: drinks 2 martini's a night (2 shots in each)  . Drug use: No  . Sexual activity: Yes  Other Topics Concern  . Not on file  Social History Narrative   Tries to remain active.  Frequent golfer but claudication limits this.   Social Determinants of Health   Financial Resource Strain:   . Difficulty of Paying Living Expenses: Not on file  Food Insecurity:   .  Worried About Charity fundraiser in the Last Year: Not on file  . Ran Out of Food in the Last Year: Not on file  Transportation Needs:   . Lack of Transportation (Medical): Not on file  . Lack of Transportation (Non-Medical): Not on file  Physical Activity:   . Days of Exercise per Week: Not on file  . Minutes of Exercise per Session: Not on file  Stress:   . Feeling of Stress : Not on file  Social Connections:   . Frequency of Communication with Friends and Family: Not on file  . Frequency of Social Gatherings with Friends and Family: Not on file  . Attends Religious Services: Not on file  . Active Member of Clubs or Organizations: Not on file  . Attends Archivist Meetings: Not on file  . Marital Status: Not on file  Intimate Partner Violence:   . Fear of Current or Ex-Partner: Not on file  . Emotionally Abused: Not on file  . Physically Abused: Not on file  . Sexually Abused: Not on file     Review of Systems    General:  No chills, fever, night sweats or weight changes.  Cardiovascular:  No chest pain, dyspnea on exertion, edema, orthopnea, palpitations, paroxysmal nocturnal dyspnea. Dermatological: No rash, lesions/masses Respiratory: No cough, dyspnea Urologic: No hematuria, dysuria Abdominal:   No nausea, vomiting, diarrhea, bright red blood per rectum, melena, or hematemesis Neurologic:  No visual changes, wkns, changes in mental status. All other systems reviewed and are otherwise negative except as noted above.  Physical Exam    VS:  BP 135/74   Pulse 62   Temp (!) 97.2 F (36.2 C)   Ht 5\' 8"  (1.727 m)   Wt 172 lb 9.6 oz (78.3 kg)   SpO2 95%   BMI 26.24 kg/m  , BMI  Body mass index is 26.24 kg/m. GEN: Well nourished, well developed, in no acute distress. HEENT: normal. Neck: Supple, no JVD, carotid bruits, or masses. Cardiac: RRR, no murmurs, rubs, or gallops. No clubbing, cyanosis, edema.  Radials/DP/PT 2+ and equal bilaterally.  Respiratory:   Respirations regular and unlabored, clear to auscultation bilaterally. GI: Soft, nontender, nondistended, BS + x 4. MS: no deformity or atrophy. Skin: warm and dry, no rash. Neuro:  Strength and sensation are intact. Psych: Normal affect.  Accessory Clinical Findings    ECG personally reviewed by me today-none today.  EKG 12/04/2019 Sinus rhythm first-degree AV block PACs and bigeminy pattern LVH prolonged QT 78 bpm  Echocardiogram 10/16/2019 IMPRESSIONS    1. Left ventricular ejection fraction, by visual estimation, is 40 to  45%. The left ventricle has mild to moderately decreased function. Normal  left ventricular size. There is mildly increased left ventricular  hypertrophy.  2. The left ventricle demonstrates global hypokinesis.  3. Global right ventricle has normal systolic function.The right  ventricular size is normal. No increase in right ventricular wall  thickness.  4. Mildly dilated left atrium, no LA appendage thrombus.  5. Right atrial size was normal.  6. The mitral valve is normal in structure. Trace mitral valve  regurgitation. No evidence of mitral stenosis.  7. The tricuspid valve is normal in structure. Tricuspid valve  regurgitation is trivial.  8. The pulmonic valve was normal in structure. Pulmonic valve  regurgitation is not visualized.  9. Mechanical aortic valve with no significant regurgitation or stenosis,  mean gradient 9 mmHg.  10. Normal caliber thoracic aorta with grade 3 plaque noted.  11. Peak RV-RA gradient 20 mmHg.   Cardiac catheterization 10/10/2019 Successful orbital atherectomy, PTCA, and stenting of the left main with a 4.0x15 mm Resolute Onyx DES  Recommend:   Resume heparin 8 hours after sheath out  Resume warfarin tonight  Continue ASA 30 days  Plavix at least 6 months if tolerated  Diagnostic Dominance: Right  Intervention     Lower extremity arterial duplex 07/12/2019 Patent right proximal SFA stent with  elevated velocities in the proximal  stent segment; moderate increase when compared to the previous exam.    Summary:  Right: Prominent distal CFA, measuring 2.0 cm.  50-74% stenosis noted at the ostial superficial femoral artery.  Essentially stable >50% in-stent restenosis in the proximal stent segment.    No significant change compared to previous study.     See table(s) above for measurements and observations.    See ABI report and aortobifemoral results on the renal duplex report.   Suggest follow up study in 12 months.  ABIs 07/12/2019 Bilateral ABIs and TBIs appear essentially unchanged compared to prior  study on 07/12/2018.    Summary:  Right: Resting right ankle-brachial index indicates noncompressible right  lower extremity arteries. The right toe-brachial index is abnormal.   Left: Resting left ankle-brachial index indicates noncompressible left  lower extremity arteries. The left toe-brachial index is abnormal.      *See table(s) above for measurements and observations.    See LE Arterial duplex report and aortobifemoral results on the renal  artery duplex report.   Suggest follow up study in 12 months.  Assessment & Plan   1.  Chronic systolic heart failure-TEE 10/16/19 post intervention showed an EF of 40-45%.  Euvolemic today.  No increased DOE Continue Entresto  24-26 twice daily Continue Lasix 20 mg daily Continue spironolactone 25 mg daily Continue digoxin 0.125 mg daily  Coronary artery disease-no chest pain today.  Cardiac catheterization 10/10/2019 successful orbital arthrectomy, PTCA, and stenting of the protected left main with DES.  LIMA-LAD patent.  SVG-PDA patent.  His echocardiogram during that admission showed EF 30-35%, mechanical AoV okay.  A TEE 10/16/2019 showed improved EF 40-45%.  He was placed on milrinone initially with a low carboxyhemoglobin and later weaned off.  He was then cardioverted from atrial flutter back to NSR.  See  above. Continue clopidogrel 75 mg daily Continue digoxin 0.125 mg daily Continue Entresto  24-26 twice daily Continue nitroglycerin 0.4 mg sublingual as needed Heart healthy low-sodium diet-salty 6 given Increase physical activity as tolerated  Hyperlipidemia-12/04/2019: Cholesterol 172; HDL 80; LDL Cholesterol 56; Triglycerides 179; VLDL 36 Continue Repatha Continue atorvastatin 80 mg daily  Mechanical aortic valve-TEE 10/27/2019 showed stable valve. Continue clopidogrel 75 mg daily Continue warfarin 5 mg as directed by Coumadin clinic.  Peripheral arterial disease-claudication described as more regular now occurring with walks at 50 yards..Aortofemoral bypass in 2016 with a follow-up iliac stenting and femoral endarterectomy.  In 2017 he underwent subsequent ostial and mid right SFA intervention.  Previous visit with Dr. Gwenlyn Found, patient stated he wished to delay intervention for several months due to his recent hospitalizations. Repeat ABIs and lower extremity Dopplers Continue Repatha Continue atorvastatin Heart healthy low-sodium high-fiber diet Increase physical activity as tolerated  Paroxysmal atrial fibrillation-DCCV 10/2019.  Heart rate 62 and regular. Continue amiodarone 200 mg daily Continue warfarin 5 mg as directed by Coumadin clinic.  Essential hypertension-BP today 135/74.  Well-controlled at home Continue Entresto  24-26 twice daily Continue Lasix 20 mg daily Heart healthy low-sodium diet Increase physical activity as tolerated  Disposition: Follow-up with Dr. Gwenlyn Found after ABIs and lower extremity Dopplers to discuss intervention.  Jossie Ng. [facility] [place] [address] Office [phone] Fax [phone]

## 2020-01-22 ENCOUNTER — Ambulatory Visit (INDEPENDENT_AMBULATORY_CARE_PROVIDER_SITE_OTHER): Payer: Medicare Other | Admitting: General Practice

## 2020-01-22 ENCOUNTER — Other Ambulatory Visit: Payer: Self-pay

## 2020-01-22 ENCOUNTER — Encounter: Payer: Self-pay | Admitting: General Practice

## 2020-01-22 VITALS — BP 135/74 | HR 62 | Temp 97.2°F | Ht 68.0 in | Wt 172.6 lb

## 2020-01-22 DIAGNOSIS — I10 Essential (primary) hypertension: Secondary | ICD-10-CM | POA: Diagnosis not present

## 2020-01-22 DIAGNOSIS — I25810 Atherosclerosis of coronary artery bypass graft(s) without angina pectoris: Secondary | ICD-10-CM

## 2020-01-22 DIAGNOSIS — I480 Paroxysmal atrial fibrillation: Secondary | ICD-10-CM | POA: Diagnosis not present

## 2020-01-22 DIAGNOSIS — I5022 Chronic systolic (congestive) heart failure: Secondary | ICD-10-CM | POA: Diagnosis not present

## 2020-01-22 DIAGNOSIS — I739 Peripheral vascular disease, unspecified: Secondary | ICD-10-CM | POA: Diagnosis not present

## 2020-01-22 DIAGNOSIS — E7800 Pure hypercholesterolemia, unspecified: Secondary | ICD-10-CM

## 2020-01-22 NOTE — Patient Instructions (Signed)
Testing/Procedures: Your physician has requested that you have an ankle brachial index (ABI). During this test an ultrasound and blood pressure cuff are used to evaluate the arteries that supply the arms and legs with blood. Allow thirty minutes for this exam. There are no restrictions or special instructions.  Reduce your risk of getting COVID-19 With your heart disease it is especially important for people at increased risk of severe illness from COVID-19, and those who live with them, to protect themselves from getting COVID-19. The best way to protect yourself and to help reduce the spread of the virus that causes COVID-19 is to: [name] Limit your interactions with other people as much as possible. . Take precautions to prevent getting COVID-19 when you do interact with others. If you start feeling sick and think you may have COVID-19, get in touch with your healthcare provider within 24 hours.  Follow-Up: AFTER ABI  In Person Quay Burow, MD.    At [facility], you and your health needs are our priority.  As part of our continuing mission to provide you with exceptional heart care, we have created designated Provider Care Teams.  These Care Teams include your primary Cardiologist (physician) and Advanced Practice Providers (APPs -  Physician Assistants and Nurse Practitioners) who all work together to provide you with the care you need, when you need it.  Thank you for choosing [facility] at [facility]!!

## 2020-01-22 NOTE — Progress Notes (Signed)
[facility]   Patient Details  Name: [name] [name] MRN: [id number] Date of Birth: 1946/01/04 Age: 74 y.o. PCP: Shon Baton, MD  Vitals:   01/22/20 1506  BP: 130/68  Pulse: 67  Resp: 16  SpO2: 94%  Weight: 171 lb (77.6 kg)  Height: 5\' 8"  (1.727 m)     [facility] Eval - 01/22/20 1500      Referral    Referring Provider  cardiac rehab    Reason for referral  Heart Failure    Program Start Date  01/30/20      Measurement   Neck measurement  16.5 Inches    Waist Circumference  40 inches    Body fat  29.4 percent      Information for Trainer   Goals  get back to golf 4-5x week    Current Exercise  none    Orthopedic Concerns  none    Pertinent Medical History  PAD, CHF, previous afib     Current Barriers  exercise endurance to legs (PAD)    Restrictions/Precautions  Other   Blood thinners: coumadin/plavix    Medications that affect exercise  Beta blocker      Timed Up and Go (TUGS)   Timed Up and Go  Low risk <9 seconds      Mobility and Daily Activities   I find it easy to walk up or down two or more flights of stairs.  3    I have no trouble taking out the trash.  4    I do housework such as vacuuming and dusting on my own without difficulty.  4    I can easily lift a gallon of milk (8lbs).  4    I can easily walk a mile.  1    I have no trouble reaching into high cupboards or reaching down to pick up something from the floor.  4    I do not have trouble doing out-door work such as Armed forces logistics/support/administrative officer, raking leaves, or gardening.  1      Mobility and Daily Activities   I feel younger than my age.  3    I feel independent.  4    I feel energetic.  2    I live an active life.   2    I feel strong.  2    I feel healthy.  2    I feel active as other people my age.  1      How fit and strong are you.   Fit and Strong Total Score  37      Past Medical History:  Diagnosis Date  . Adenomatous colon polyp 09/1997  . Anemia   . Aortic  stenosis    s/p st. jude mechanical AVR - Chronic Coumadin  . Blood transfusion    "related to ITP"  . Coronary artery disease    s/p cabg x 3 11/2003: lima-lad, seq vg to rpda and rpl  . Diverticulitis of colon   . Heart murmur   . Hyperlipidemia   . Hypertension   . ITP (idiopathic thrombocytopenic purpura)   . Peripheral arterial disease ([place])    a. history of aortobifemoral bypass grafting by Dr. Sherren Mocha early b. LE angiography 04/22/2015 patent aortobifem graft, DES to R SFA  . Peripheral vascular disease ([place])    s/p Left external Iliac Artery stenting and subsequent left femoral endarterectomy 02/2011- post op course complicated by wound infxn req  I&D 03/2011  . Renal artery stenosis, native, bilateral (HCC)    a. bilateral renal artery stenosis by recent duplex ultrasound b. L renal artery stent 02/2015, R renal artery patent on angiogram  . Stroke ([place])   . TIA (transient ischemic attack) ~ 2013  . Type II diabetes mellitus ([place])    Past Surgical History:  Procedure Laterality Date  . ABDOMINAL AORTAGRAM N/A 12/16/2011   Procedure: ABDOMINAL Maxcine Ham;  Surgeon: Sherren Mocha, MD;  Location: [facility] CATH LAB;  Service: Cardiovascular;  Laterality: N/A;  . ANGIOPLASTY / STENTING ILIAC     Left external Iliac Artery  . AORTA - BILATERAL FEMORAL ARTERY BYPASS GRAFT  01/18/2012   Procedure: AORTA BIFEMORAL BYPASS GRAFT;  Surgeon: Curt Jews, MD;  Location: [place];  Service: Vascular;  Laterality: N/A;  . AORTIC VALVE REPLACEMENT  ~ 2004  . CARDIAC CATHETERIZATION  11/2003   /pt report 10/01/2016  . CARDIAC VALVE REPLACEMENT  11/2003   aortic  . CARDIOVERSION N/A 10/16/2019   Procedure: CARDIOVERSION;  Surgeon: Larey Dresser, MD;  Location[facility] ENDOSCOPY;  Service: Cardiovascular;  Laterality: N/A;  . CATARACT EXTRACTION W/ INTRAOCULAR LENS  IMPLANT, BILATERAL Bilateral   . COLONOSCOPY    . COLONOSCOPY WITH PROPOFOL N/A 09/03/2019   Procedure: COLONOSCOPY WITH PROPOFOL;  Surgeon: Doran Stabler, MD;  Location: [facility] ENDOSCOPY;  Service: Gastroenterology;  Laterality: N/A;  . CORONARY ARTERY BYPASS GRAFT  11/2003   Archie Endo 04/21/2011  . CORONARY ATHERECTOMY N/A 10/10/2019   Procedure: CORONARY ATHERECTOMY;  Surgeon: Sherren Mocha, MD;  Location: [place] CV LAB;  Service: Cardiovascular;  Laterality: N/A;  . CORONARY STENT INTERVENTION N/A 10/10/2019   Procedure: CORONARY STENT INTERVENTION;  Surgeon: Sherren Mocha, MD;  Location: [place] CV LAB;  Service: Cardiovascular;  Laterality: N/A;  . CORONARY/GRAFT ANGIOGRAPHY N/A 09/18/2019   Procedure: CORONARY/GRAFT ANGIOGRAPHY;  Surgeon: Lorretta Harp, MD;  Location: [place] CV LAB;  Service: Cardiovascular;  Laterality: N/A;  . ESOPHAGOGASTRODUODENOSCOPY (EGD) WITH PROPOFOL N/A 02/27/2019   Procedure: ESOPHAGOGASTRODUODENOSCOPY (EGD) WITH PROPOFOL;  Surgeon: Doran Stabler, MD;  Location: [place];  Service: Endoscopy;  Laterality: N/A;  . HEMOSTASIS CLIP PLACEMENT  09/03/2019   Procedure: HEMOSTASIS CLIP PLACEMENT;  Surgeon: Doran Stabler, MD;  Location: [facility] ENDOSCOPY;  Service: Gastroenterology;;  . LOWER EXTREMITY ANGIOGRAM N/A 02/21/2015   Procedure: LOWER EXTREMITY ANGIOGRAM;  Surgeon: Lorretta Harp, MD;  Location: [facility] CATH LAB;  Service: Cardiovascular;  Laterality: N/A;  . PERIPHERAL VASCULAR CATHETERIZATION N/A 04/22/2015   Procedure: Lower Extremity Angiography;  Surgeon: Lorretta Harp, MD;  Location: [place] CV LAB;  Service: Cardiovascular;  Laterality: N/A;  . PERIPHERAL VASCULAR CATHETERIZATION N/A 08/24/2016   Procedure: Lower Extremity Angiography;  Surgeon: Lorretta Harp, MD;  Location: [place] CV LAB;  Service: Cardiovascular;  Laterality: N/A;  . PERIPHERAL VASCULAR CATHETERIZATION Right 10/01/2016   Procedure: Peripheral Vascular Intervention - STENT;  Surgeon: Lorretta Harp, MD;  Location: [place] CV LAB;  Service: Cardiovascular;  Laterality: Right;  Prox and MID SFA    . POLYPECTOMY    . RENAL ANGIOGRAM N/A 02/21/2015   Procedure: RENAL ANGIOGRAM;  Surgeon: Lorretta Harp, MD;  Location: [facility] CATH LAB;  Service: Cardiovascular;  Laterality: Bilateral; 6 mm x 12 mm long Herculink balloon expandable stent to the left renal artery  . RENAL ARTERY STENT Left 04/22/2015   dr berry  . SPLENECTOMY  02/2003   Archie Endo 04/21/2011  .  TEE WITHOUT CARDIOVERSION N/A 10/16/2019   Procedure: TRANSESOPHAGEAL ECHOCARDIOGRAM (TEE);  Surgeon: Larey Dresser, MD;  Location: [facility] ENDOSCOPY;  Service: Cardiovascular;  Laterality: N/A;  . TEMPORARY PACEMAKER N/A 10/10/2019   Procedure: TEMPORARY PACEMAKER;  Surgeon: Sherren Mocha, MD;  Location: [place] CV LAB;  Service: Cardiovascular;  Laterality: N/A;  . TONSILLECTOMY  ~ 1952   Social History   Tobacco Use  Smoking Status Light Tobacco Smoker  . Years: 30.00  . Types: Cigarettes, Cigars  . Last attempt to quit: 12/15/1993  . Years since quitting: 26.1  Smokeless Tobacco Never Used  Tobacco Comment   occasional cigar    Anxious to get started and get back some of his endurance after new dx of CHF.  Intake completed for PREP class starting on 01/30/2020 Every Tuesday/Thursday 1pm-215pm x 12 weeks.    Barnett Hatter 01/22/2020, 3:11 PM

## 2020-01-23 ENCOUNTER — Encounter (HOSPITAL_COMMUNITY): Payer: Medicare Other | Admitting: Cardiology

## 2020-01-26 DIAGNOSIS — H43393 Other vitreous opacities, bilateral: Secondary | ICD-10-CM | POA: Diagnosis not present

## 2020-01-26 DIAGNOSIS — H31093 Other chorioretinal scars, bilateral: Secondary | ICD-10-CM | POA: Diagnosis not present

## 2020-01-30 NOTE — Progress Notes (Signed)
[facility] PREP Weekly Session   Patient Details  Name: Valen Gillison MRN: [id number] Date of Birth: [birth year] Age: 74 y.o. PCP: Shon Baton, MD  There were no vitals filed for this visit.  [facility] Weekly seesion - 01/30/20 1500      Weekly Session   Topic Discussed  Goal setting and welcome to the program   Scale of perceived exertion   Comments  Given copy of 12 wk strength training sched       Toured facility Reviewed goals Reviewed plans for 12 wks and plans for Thursday   Pam M Pal[place]ra Rogacki 01/30/2020, 3:24 PM

## 2020-01-31 ENCOUNTER — Ambulatory Visit: Payer: Medicare Other | Admitting: Cardiology

## 2020-02-02 ENCOUNTER — Other Ambulatory Visit: Payer: Self-pay

## 2020-02-02 ENCOUNTER — Ambulatory Visit (HOSPITAL_COMMUNITY)
Admission: RE | Admit: 2020-02-02 | Discharge: 2020-02-02 | Disposition: A | Discharge status: Home / Self Care | Payer: Medicare Other | Source: Ambulatory Visit | Attending: Cardiovascular Disease | Admitting: Cardiovascular Disease

## 2020-02-02 DIAGNOSIS — I739 Peripheral vascular disease, unspecified: Secondary | ICD-10-CM | POA: Insufficient documentation

## 2020-02-02 DIAGNOSIS — Z95820 Peripheral vascular angioplasty status with implants and grafts: Secondary | ICD-10-CM

## 2020-02-06 ENCOUNTER — Encounter: Payer: Self-pay | Admitting: Cardiovascular Disease

## 2020-02-06 ENCOUNTER — Ambulatory Visit (INDEPENDENT_AMBULATORY_CARE_PROVIDER_SITE_OTHER): Payer: Medicare Other | Admitting: Cardiovascular Disease

## 2020-02-06 ENCOUNTER — Other Ambulatory Visit: Payer: Self-pay

## 2020-02-06 VITALS — BP 130/60 | HR 64 | Temp 98.2°F | Ht 68.0 in | Wt 175.0 lb

## 2020-02-06 DIAGNOSIS — I25810 Atherosclerosis of coronary artery bypass graft(s) without angina pectoris: Secondary | ICD-10-CM | POA: Diagnosis not present

## 2020-02-06 DIAGNOSIS — I739 Peripheral vascular disease, unspecified: Secondary | ICD-10-CM | POA: Diagnosis not present

## 2020-02-06 NOTE — Assessment & Plan Note (Signed)
[name] has symptomatic PAD status post remote aortobifemoral bypass grafting.  I performed staged right SFA directional atherectomy 04/22/2015 and ultimately performed proximal right SFA and mid right SFA intervention using antegrade approach 10/01/2016 with an excellent angiographic and clinical result.  Over the last 6 months he has noticed increasing claudication which is lifestyle limiting preventing him from being able to perform in rehab.  His lower extremity arterial Doppler study performed 02/02/2020 did reveal a high-grade signal in his proximal right SFA stent which I demonstrated at the time of cardiac catheterization in October.  This may be difficult to revascularize given its proximity to the access site.  The only other option may potentially be brachial access.  I will review this with my colleagues prior to making a decision regarding suitability for revascularization.

## 2020-02-06 NOTE — Progress Notes (Signed)
[facility] PREP Weekly Session   Patient Details  Name: [name] [name] MRN: [id number] Date of Birth: 11-14-1946 Age: 74 y.o. PCP: Shon Baton, MD  There were no vitals filed for this visit.  [facility] Weekly seesion - 02/06/20 1600      Weekly Session   Topic Discussed  Importance of resistance training;Other ways to be active    Minutes exercised this week  20 minutes   20 cardio, no strength, no flexibility   Classes attended to date  3    Comments  1st day of cardio     WT 175 lbs  Grateful for: life, family, 13 y/o cat Nutrition celebration: baked a sugar free apple pie Barriers: blockage in my right leg   Tolerated 20 min walk on treadmill without issue.   Barnett Hatter 02/06/2020, 4:29 PM

## 2020-02-06 NOTE — Patient Instructions (Signed)
Medication Instructions:  Your physician recommends that you continue on your current medications as directed. Please refer to the Current Medication list given to you today.  If you need a refill on your cardiac medications before your next appointment, please call your pharmacy.   Lab work: NONE  Testing/Procedures: NONE  Follow-Up: At [facility], you and your health needs are our priority.  As part of our continuing mission to provide you with exceptional heart care, we have created designated Provider Care Teams.  These Care Teams include your primary Cardiologist (physician) and Advanced Practice Providers (APPs -  Physician Assistants and Nurse Practitioners) who all work together to provide you with the care you need, when you need it. You may see Dr. Gwenlyn Found or one of the following Advanced Practice Providers on your designated Care Team:    Kerin Ransom, PA-C  [place], [place]  Coletta Memos, [place]  Your physician wants you to follow-up in: 6 months with Dr. Gwenlyn Found. You will receive a reminder letter in the mail two months in advance. If you don't receive a letter, please call our office to schedule the follow-up appointment.

## 2020-02-06 NOTE — Progress Notes (Signed)
02/06/2020 [name] [name]   Jan 29, 1946  [id number]  Primary Physician Shon Baton, MD Primary Cardiologist: Lorretta Harp MD [name], [place]  HPI:  Kolston Lacount is a 74 y.o.   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 I last saw him in the office  10/30/2019. He retired a little over 2 years ago having worked there for 25 years. His primary care physician is Dr. Virgina Jock. He was referred by Dr. Johnsie Cancel for peripheral vascular evaluation because of resistant hypertension with duplex evidence of bilateral renal artery stenosis as well as new-onset right calf claudication with recent Dopplers that showed a lesion in his mid right SFA. He does have a history of remote tobacco abuse as well as treated hypertension, hyperlipidemia and diabetes. He has had coronary bypass grafting and St. Jude aVR performed by Dr. Cyndia Bent December 2004 on Coumadin anticoagulation. He has also had aortobifemoral bypass grafting, left external iliac stenting and left common femoral endarterectomy performed by Dr. Donnetta Hutching. I performed left renal artery stenting 02/21/15 with an excellent angiographic and clinical result. His blood pressure has been under better control. I then perform staged right SFA directional atherectomy 04/22/15 removing a high-grade eccentric calcified mid to distal right SFA plaque. He did have progression of disease at the origin of his right SFA. His claudication markedly improved after his procedure however since I saw on the year ago he's had some recurrent claudication. His blood pressures have been under better control since his renal artery intervention. Recent lower extremity Doppler studies performed 07/15/16 revealed a right ABI 0.34 with a new high-frequency signal in the origin of his right SFA. I performed angiography 08/24/16 revealing a  95% proximal right SFA stenosis, 80% mid right SFA stenosis and 80% calcified mid left SFA stenosis. In addition, he had 75% "in-stent restenosis" within the previously placed left renal artery stent. Intervention will be somewhat challenging given the proximity of the proximal right SFA stenosis . He underwent peripheral angiography intervention by myself 10/01/16. Right antegrade approach. I was able to stick the hood of his aortobifem and intrarenal in the proximal right SFA with drug-eluting [place] plasty and stents. I also performed drug-eluting plasty in the mid right SFA. His symptoms resolved after that and he is back playing golf. He was discharged on the following day. His Dopplers have normalized. Since I saw him in the office a year ago he's remained stable. He denies claudication. His most recent Dopplers performed in July oflastyear revealed widely patent proximal right SFA stent and mid right SFA. Since I saw him several months ago he was recently admitted with a stroke. His INR was subtherapeutic for unclear reasons. He was re-anticoagulated. An MRI showed a small left frontal CVA. He did have some facial drooping and numbness which is slowly improving. He still denies claudication.  Since I saw him in the office 07/12/2018 he was admitted with chest pain and underwent diagnostic coronary angiography by myself on 07/18/2018 revealing high-grade calcified left main stenosis, occluded LAD proximally with a patent LIMA and high-grade mid AV groove circumflex.  He had an occluded dominant RCA at its origin with a patent sequential vein to the PDA and PLA.  At that time I elected to treat him medically because of the complexity of the revascularization procedure.  Unfortunately, he was readmitted with heart failure  and non-STEMI and underwent orbital atherectomy, PCI and drug-eluting stenting by Dr. Burt Knack using a 4 mm x 15 mm long resolute Onyx drug-eluting stent.  His mid AV groove circumflex  was not intervened on.  His EF increased as a result from 30-35 to 40-45.  In addition, he he underwent successful DC cardioversion from A. fib to sinus rhythm after TEE showed no evidence of intracardiac thrombi.  He was discharged home on "triple therapy" including low-dose aspirin, Plavix and Coumadin anticoagulation for his Saint Jude mechanical valve with plan to drop his aspirin after 30 days.  At the time of angiography in October I did note 80% "in-stent restenosis within his proximal right SFA stent and he does admit to claudication.  I performed cardiac catheterization on him 09/18/2019 revealing calcified severe left main disease with a patent sequential vein to the PDA and PLA and LIMA to the LAD.  His left main was compromising his circumflex circulation.  He did feel medical therapy and ultimately underwent orbital atherectomy and left main stenting by Dr. Burt Knack 10/10/2019 resulting in marked improvement in his symptoms.  His EF actually did improve from 30/35% up to 40/45% by 2D echo.  He has had progressive lifestyle limiting claudication in his right leg over the last 6 months with recent Doppler study performed 02/02/2020 revealing a high-frequency signal in his proximal right SFA stent.  I did demonstrate this by angiography at the time of cath as well.   Current Meds  Medication Sig  . amiodarone (PACERONE) 200 MG tablet Take 1 tablet (200 mg total) by mouth daily.  [name] amoxicillin (AMOXIL) 500 MG capsule TAKE 4 CAPSULES BY MOUTH 30 TO 60 MINTUES BEFORE APPOINTMENT.  [name] atorvastatin (LIPITOR) 80 MG tablet Take 80 mg by mouth daily.   . canagliflozin (INVOKANA) 300 MG TABS tablet Take 300 mg by mouth daily.   . clopidogrel (PLAVIX) 75 MG tablet Take 1 tablet (75 mg total) by mouth daily.  . digoxin (LANOXIN) 0.125 MG tablet TAKE 1 TABLET BY MOUTH DAILY  . Evolocumab (REPATHA SURECLICK) 268 MG/ML SOAJ Inject 140 mg into the skin every 14 (fourteen) days.  . furosemide (LASIX) 20 MG tablet  Take 1 tablet (20 mg total) by mouth daily.  [name] glyBURIDE (DIABETA) 2.5 MG tablet Take 2.5 mg by mouth 2 (two) times daily with a meal.   . nitroGLYCERIN (NITROSTAT) 0.4 MG SL tablet Place 1 tablet (0.4 mg total) under the tongue every 5 (five) minutes x 3 doses as needed for chest pain.  . pantoprazole (PROTONIX) 40 MG tablet Take 40 mg by mouth daily.  . potassium chloride SA (KLOR-CON) 20 MEQ tablet Take 1 tablet (20 mEq total) by mouth daily.  . sacubitril-valsartan (ENTRESTO) 24-26 MG Take 1 tablet by mouth 2 (two) times daily.  [name] spironolactone (ALDACTONE) 25 MG tablet Take 1 tablet (25 mg total) by mouth daily.  [name] warfarin (COUMADIN) 5 MG tablet Take as directed by the coumadin clinic     No Known Allergies  Social History   Socioeconomic History  . Marital status: Divorced    Spouse name: Not on file  . Number of children: 3  . Years of education: Not on file  . Highest education level: Not on file  Occupational History  . Occupation: Retired  Tobacco Use  . Smoking status: Light Tobacco Smoker    Years: 30.00    Types: Cigarettes, Cigars    Last attempt to quit: 12/15/1993    Years since  quitting: 26.1  . Smokeless tobacco: Never Used  . Tobacco comment: occasional cigar  Substance and Sexual Activity  . Alcohol use: Yes    Alcohol/week: 16.0 standard drinks    Types: 1 Cans of beer, 1 Shots of liquor, 14 Standard drinks or equivalent per week    Comment: drinks 2 martini's a night (2 shots in each)  . Drug use: No  . Sexual activity: Yes  Other Topics Concern  . Not on file  Social History Narrative   Tries to remain active.  Frequent golfer but claudication limits this.   Social Determinants of Health   Financial Resource Strain:   . Difficulty of Paying Living Expenses: Not on file  Food Insecurity:   . Worried About Charity fundraiser in the Last Year: Not on file  . Ran Out of Food in the Last Year: Not on file  Transportation Needs:   . Lack of  Transportation (Medical): Not on file  . Lack of Transportation (Non-Medical): Not on file  Physical Activity:   . Days of Exercise per Week: Not on file  . Minutes of Exercise per Session: Not on file  Stress:   . Feeling of Stress : Not on file  Social Connections:   . Frequency of Communication with Friends and Family: Not on file  . Frequency of Social Gatherings with Friends and Family: Not on file  . Attends Religious Services: Not on file  . Active Member of Clubs or Organizations: Not on file  . Attends Archivist Meetings: Not on file  . Marital Status: Not on file  Intimate Partner Violence:   . Fear of Current or Ex-Partner: Not on file  . Emotionally Abused: Not on file  . Physically Abused: Not on file  . Sexually Abused: Not on file     Review of Systems: General: negative for chills, fever, night sweats or weight changes.  Cardiovascular: negative for chest pain, dyspnea on exertion, edema, orthopnea, palpitations, paroxysmal nocturnal dyspnea or shortness of breath Dermatological: negative for rash Respiratory: negative for cough or wheezing Urologic: negative for hematuria Abdominal: negative for nausea, vomiting, diarrhea, bright red blood per rectum, melena, or hematemesis Neurologic: negative for visual changes, syncope, or dizziness All other systems reviewed and are otherwise negative except as noted above.    Blood pressure 130/60, pulse 64, temperature 98.2 F (36.8 C), height 5\' 8"  (1.727 m), weight 175 lb (79.4 kg).  General appearance: alert and no distress Neck: no adenopathy, no carotid bruit, no JVD, supple, symmetrical, trachea midline and thyroid not enlarged, symmetric, no tenderness/mass/nodules Lungs: clear to auscultation bilaterally Heart: 2/6 outflow tract murmur consistent with aortic stenosis Extremities: extremities normal, atraumatic, no cyanosis or edema Pulses: 2+ and symmetric Skin: Skin color, texture, turgor normal. No  rashes or lesions Neurologic: Alert and oriented X 3, normal strength and tone. Normal symmetric reflexes. Normal coordination and gait  EKG not performed today  ASSESSMENT AND PLAN:   PVD (peripheral vascular disease) [facility]) Mr. Enderle has symptomatic PAD status post remote aortobifemoral bypass grafting.  I performed staged right SFA directional atherectomy 04/22/2015 and ultimately performed proximal right SFA and mid right SFA intervention using antegrade approach 10/01/2016 with an excellent angiographic and clinical result.  Over the last 6 months he has noticed increasing claudication which is lifestyle limiting preventing him from being able to perform in rehab.  His lower extremity arterial Doppler study performed 02/02/2020 did reveal a high-grade signal in his proximal right  SFA stent which I demonstrated at the time of cardiac catheterization in October.  This may be difficult to revascularize given its proximity to the access site.  The only other option may potentially be brachial access.  I will review this with my colleagues prior to making a decision regarding suitability for revascularization.      Lorretta Harp MD FACP,FACC,FAHA, [facility] 02/06/2020 9:14 AM

## 2020-02-13 DIAGNOSIS — E7849 Other hyperlipidemia: Secondary | ICD-10-CM | POA: Diagnosis not present

## 2020-02-13 DIAGNOSIS — Z125 Encounter for screening for malignant neoplasm of prostate: Secondary | ICD-10-CM | POA: Diagnosis not present

## 2020-02-13 DIAGNOSIS — E1122 Type 2 diabetes mellitus with diabetic chronic kidney disease: Secondary | ICD-10-CM | POA: Diagnosis not present

## 2020-02-13 NOTE — Progress Notes (Signed)
[facility] PREP Weekly Session   Patient Details  Name: [name] [name] MRN: [id number] Date of Birth: [birth year] Age: 74 y.o. PCP: Shon Baton, MD  Vitals:   02/13/20 1755  Weight: 168 lb (76.2 kg)    [facility] Weekly seesion - 02/13/20 1700      Weekly Session   Topic Discussed  Healthy eating tips   hidden sugar brochure given   Minutes exercised this week  190 minutes   90 min of cardio, strength 40 min and flexibility 60   Classes attended to date  5      Fun things since last meeting: hit a bag of golf balls Grateful for: being vaccinated Nutrition celebration: reducing bread in diet   Barnett Hatter 02/13/2020, 5:56 PM

## 2020-02-14 ENCOUNTER — Other Ambulatory Visit: Payer: Self-pay

## 2020-02-14 ENCOUNTER — Telehealth: Payer: Self-pay | Admitting: Cardiovascular Disease

## 2020-02-14 ENCOUNTER — Ambulatory Visit (INDEPENDENT_AMBULATORY_CARE_PROVIDER_SITE_OTHER): Payer: Medicare Other | Admitting: Pharmacist

## 2020-02-14 ENCOUNTER — Telehealth: Payer: Self-pay

## 2020-02-14 DIAGNOSIS — Z5181 Encounter for therapeutic drug level monitoring: Secondary | ICD-10-CM | POA: Diagnosis not present

## 2020-02-14 LAB — POCT INR: INR: 1.9 — AB (ref 2.0–3.0)

## 2020-02-14 NOTE — Telephone Encounter (Signed)
VAS [place] LOWER EXTREMITY ARTERIAL DUPLEX: Result Notes Lorretta Harp, MD 02/03/2020 11:11 AM EST Return office visit with me to discuss results at next available   Returned call to pt notified of above. Scheduled appointment for 02-20-2020 at 10am to arrive at 945am alone and wearing a mask to discuss results. Left detailed message to come to appointment to discuss, call back if unable to make this appointment.

## 2020-02-14 NOTE — H&P (View-Only) (Signed)
Cardiology Clinic Note   Patient Name: [name] [name] Date of Encounter: 02/27/2020  Primary Care Provider:  Shon Baton, MD Primary Cardiologist:  Jenkins Rouge, MD  Peripheral Vascular:  Quay Burow   Past Medical History    Past Medical History:  Diagnosis Date  . Adenomatous colon polyp 09/1997  . Anemia   . Aortic stenosis    s/p st. jude mechanical AVR - Chronic Coumadin  . Blood transfusion    "related to ITP"  . Coronary artery disease    s/p cabg x 3 11/2003: lima-lad, seq vg to rpda and rpl  . Diverticulitis of colon   . Heart murmur   . Hyperlipidemia   . Hypertension   . ITP (idiopathic thrombocytopenic purpura)   . Peripheral arterial disease ([place])    a. history of aortobifemoral bypass grafting by Dr. Sherren Mocha early b. LE angiography 04/22/2015 patent aortobifem graft, DES to R SFA  . Peripheral vascular disease ([place])    s/p Left external Iliac Artery stenting and subsequent left femoral endarterectomy 02/2011- post op course complicated by wound infxn req I&D 03/2011  . Renal artery stenosis, native, bilateral ([facility])    a. bilateral renal artery stenosis by recent duplex ultrasound b. L renal artery stent 02/2015, R renal artery patent on angiogram  . Stroke ([place])   . TIA (transient ischemic attack) ~ 2013  . Type II diabetes mellitus ([place])    Past Surgical History:  Procedure Laterality Date  . ABDOMINAL AORTAGRAM N/A 12/16/2011   Procedure: ABDOMINAL Maxcine Ham;  Surgeon: Sherren Mocha, MD;  Location: [facility] CATH LAB;  Service: Cardiovascular;  Laterality: N/A;  . ANGIOPLASTY / STENTING ILIAC     Left external Iliac Artery  . AORTA - BILATERAL FEMORAL ARTERY BYPASS GRAFT  01/18/2012   Procedure: AORTA BIFEMORAL BYPASS GRAFT;  Surgeon: Curt Jews, MD;  Location: [name];  Service: Vascular;  Laterality: N/A;  . AORTIC VALVE REPLACEMENT  ~ 2004  . CARDIAC CATHETERIZATION  11/2003   /pt report 10/01/2016  . CARDIAC VALVE REPLACEMENT  11/2003   aortic  .  CARDIOVERSION N/A 10/16/2019   Procedure: CARDIOVERSION;  Surgeon: Larey Dresser, MD;  Location: [facility] ENDOSCOPY;  Service: Cardiovascular;  Laterality: N/A;  . CATARACT EXTRACTION W/ INTRAOCULAR LENS  IMPLANT, BILATERAL Bilateral   . COLONOSCOPY    . COLONOSCOPY WITH PROPOFOL N/A 09/03/2019   Procedure: COLONOSCOPY WITH PROPOFOL;  Surgeon: Doran Stabler, MD;  Location: [facility] ENDOSCOPY;  Service: Gastroenterology;  Laterality: N/A;  . CORONARY ARTERY BYPASS GRAFT  11/2003   Archie Endo 04/21/2011  . CORONARY ATHERECTOMY N/A 10/10/2019   Procedure: CORONARY ATHERECTOMY;  Surgeon: Sherren Mocha, MD;  Location: [place] CV LAB;  Service: Cardiovascular;  Laterality: N/A;  . CORONARY STENT INTERVENTION N/A 10/10/2019   Procedure: CORONARY STENT INTERVENTION;  Surgeon: Sherren Mocha, MD;  Location: [place] CV LAB;  Service: Cardiovascular;  Laterality: N/A;  . CORONARY/GRAFT ANGIOGRAPHY N/A 09/18/2019   Procedure: CORONARY/GRAFT ANGIOGRAPHY;  Surgeon: Lorretta Harp, MD;  Location: [place] CV LAB;  Service: Cardiovascular;  Laterality: N/A;  . ESOPHAGOGASTRODUODENOSCOPY (EGD) WITH PROPOFOL N/A 02/27/2019   Procedure: ESOPHAGOGASTRODUODENOSCOPY (EGD) WITH PROPOFOL;  Surgeon: Doran Stabler, MD;  Location: [place];  Service: Endoscopy;  Laterality: N/A;  . HEMOSTASIS CLIP PLACEMENT  09/03/2019   Procedure: HEMOSTASIS CLIP PLACEMENT;  Surgeon: Doran Stabler, MD;  Location: [facility] ENDOSCOPY;  Service: Gastroenterology;;  . LOWER EXTREMITY ANGIOGRAM N/A 02/21/2015   Procedure: LOWER EXTREMITY ANGIOGRAM;  Surgeon: Lorretta Harp, MD;  Location: [facility] CATH LAB;  Service: Cardiovascular;  Laterality: N/A;  . PERIPHERAL VASCULAR CATHETERIZATION N/A 04/22/2015   Procedure: Lower Extremity Angiography;  Surgeon: Lorretta Harp, MD;  Location: [place] CV LAB;  Service: Cardiovascular;  Laterality: N/A;  . PERIPHERAL VASCULAR CATHETERIZATION N/A 08/24/2016   Procedure: Lower Extremity  Angiography;  Surgeon: Lorretta Harp, MD;  Location: [place] CV LAB;  Service: Cardiovascular;  Laterality: N/A;  . PERIPHERAL VASCULAR CATHETERIZATION Right 10/01/2016   Procedure: Peripheral Vascular Intervention - STENT;  Surgeon: Lorretta Harp, MD;  Location: [place] CV LAB;  Service: Cardiovascular;  Laterality: Right;  Prox and MID SFA   . POLYPECTOMY    . RENAL ANGIOGRAM N/A 02/21/2015   Procedure: RENAL ANGIOGRAM;  Surgeon: Lorretta Harp, MD;  Location: [facility] CATH LAB;  Service: Cardiovascular;  Laterality: Bilateral; 6 mm x 12 mm long Herculink balloon expandable stent to the left renal artery  . RENAL ARTERY STENT Left 04/22/2015   dr berry  . SPLENECTOMY  02/2003   Archie Endo 04/21/2011  . TEE WITHOUT CARDIOVERSION N/A 10/16/2019   Procedure: TRANSESOPHAGEAL ECHOCARDIOGRAM (TEE);  Surgeon: Larey Dresser, MD;  Location: [facility] ENDOSCOPY;  Service: Cardiovascular;  Laterality: N/A;  . TEMPORARY PACEMAKER N/A 10/10/2019   Procedure: TEMPORARY PACEMAKER;  Surgeon: Sherren Mocha, MD;  Location: [place] CV LAB;  Service: Cardiovascular;  Laterality: N/A;  . TONSILLECTOMY  ~ 1952    Allergies  No Known Allergies  History of Present Illness  [name] has past medical history of essential hypertension, unstable angina, NSTEMI, CAD status post CABG, aortic valve disorder (mechanical AVR on Coumadin,, TIA, PVD, renal artery stenosis, DM, HLD, recurrent GI bleed, requires Lovenox bridging off heparin.  He had aortofemoral bypass in 2016 with a follow-up iliac stenting and femoral endarterectomy.  In 2017 he underwent subsequent ostial and mid right SFA intervention.  He had renal artery stenting in 2016 with repeat intervention on the left for 75% in-stent restenosis, noted progression of disease on the right renal artery showing 60% stenosis on duplex (07/2019).  His echocardiogram 09/2019 showed a normal EF 60-65%, and normal AVR function, chronic diastolic HF.  He was  admitted 10/20 with chest pain.  He underwent cardiac catheterization 09/18/2019 which showed 99% left main stenosis and patent LIMA to occluded LAD and a patent SVG to the PDA and PLA.  His left main stenosis jeopardized a moderately large ramus and a high first marginal and nondominant CX which had 90% mid AV groove CX stenosis.  Medical management was recommended at that time.  He was readmitted 10/20 with CHF and chest pain.  His hospital course was complicated by a flutter/low output heart failure.  Status post successful orbital arthrectomy, PTCA, and stenting of the protected left main with DES.  LIMA-LAD patent.  SVG-PDA patent.  His echocardiogram during that admission showed EF 30-35%, mechanical AoV okay.  A TEE 10/16/2019 showed improved EF 40-45%.  He was placed on milrinone initially with a low carboxyhemoglobin and later weaned off.  He was then cardioverted from atrial flutter back to NSR.  He saw Dr. Aundra Dubin for CHF follow-up 12/04/2019.  He remained in normal sinus rhythm at that time.  Still with right calf claudication 02/01/20 LE arterial duplex with high velocity 4.15 msec at proximal right SFA stent Seen by Dr Gwenlyn Found 02/06/20 note indicates percutaneous intervention may be difficult   His claudication is worsening on right LE. No angina Compliant  with meds  Being transitioned from coumadin to lovenox for procedure in 48 hours      Home Medications    Prior to Admission medications   Medication Sig Start Date End Date Taking? Authorizing Provider  amiodarone (PACERONE) 200 MG tablet Take 1 tablet (200 mg total) by mouth daily. 10/26/19   Clegg, Amy D, NP  amoxicillin (AMOXIL) 500 MG capsule TAKE 4 CAPSULES BY MOUTH 30 TO 60 MINTUES BEFORE APPOINTMENT. 06/08/19   [provider]  atorvastatin (LIPITOR) 80 MG tablet Take 80 mg by mouth daily.     [provider]  canagliflozin (INVOKANA) 300 MG TABS tablet Take 300 mg by mouth daily.     [provider]   clopidogrel (PLAVIX) 75 MG tablet Take 1 tablet (75 mg total) by mouth daily. 10/18/19   Clegg, Amy D, NP  digoxin (LANOXIN) 0.125 MG tablet TAKE 1 TABLET BY MOUTH DAILY 12/05/19   Larey Dresser, MD  Evolocumab (REPATHA SURECLICK) 263 MG/ML SOAJ Inject 140 mg into the skin every 14 (fourteen) days.    [provider]  furosemide (LASIX) 20 MG tablet Take 1 tablet (20 mg total) by mouth daily. 12/04/19   Larey Dresser, MD  glyBURIDE (DIABETA) 2.5 MG tablet Take 2.5 mg by mouth 2 (two) times daily with a meal.     [provider]  nitroGLYCERIN (NITROSTAT) 0.4 MG SL tablet Place 1 tablet (0.4 mg total) under the tongue every 5 (five) minutes x 3 doses as needed for chest pain. 09/19/19   Daune Perch, NP  pantoprazole (PROTONIX) 40 MG tablet Take 40 mg by mouth daily. 09/22/19   [provider]  potassium chloride SA (KLOR-CON) 20 MEQ tablet Take 1 tablet (20 mEq total) by mouth daily. 10/17/19   Clegg, Amy D, NP  sacubitril-valsartan (ENTRESTO) 24-26 MG Take 1 tablet by mouth 2 (two) times daily. 12/04/19   Larey Dresser, MD  spironolactone (ALDACTONE) 25 MG tablet Take 1 tablet (25 mg total) by mouth daily. 10/18/19   Clegg, Amy D, NP  warfarin (COUMADIN) 5 MG tablet Take as directed by the coumadin clinic    [provider]    Family History    Family History  Problem Relation Age of Onset  . Coronary artery disease Mother        bypass surgery - deceased  . Heart disease Father        murmur, valve replacement - deceased  . Breast cancer Sister   . Diabetes Other        grandmother  . Diabetes Paternal Grandmother   . Diabetes Paternal Aunt   . Colon cancer Neg Hx   . Colon polyps Neg Hx   . Esophageal cancer Neg Hx   . Rectal cancer Neg Hx   . Stomach cancer Neg Hx    He indicated that his mother is deceased. He indicated that his father is deceased. He indicated that the status of his sister is unknown. He indicated that his  paternal grandmother is deceased. He indicated that his paternal aunt is deceased. He indicated that the status of his neg hx is unknown. He indicated that the status of his other is unknown.  Social History    Social History   Socioeconomic History  . Marital status: Divorced    Spouse name: Not on file  . Number of children: 3  . Years of education: Not on file  . Highest education level: Not on file  Occupational History  . Occupation: Retired  Tobacco Use  . Smoking status: Light Tobacco Smoker    Years: 30.00    Types: Cigarettes, Cigars    Last attempt to quit: 12/15/1993    Years since quitting: 26.2  . Smokeless tobacco: Never Used  . Tobacco comment: occasional cigar  Substance and Sexual Activity  . Alcohol use: Yes    Alcohol/week: 16.0 standard drinks    Types: 1 Cans of beer, 1 Shots of liquor, 14 Standard drinks or equivalent per week    Comment: drinks 2 martini's a night (2 shots in each)  . Drug use: No  . Sexual activity: Yes  Other Topics Concern  . Not on file  Social History Narrative   Tries to remain active.  Frequent golfer but claudication limits this.   Social Determinants of Health   Financial Resource Strain:   . Difficulty of Paying Living Expenses:   Food Insecurity:   . Worried About Charity fundraiser in the Last Year:   . Arboriculturist in the Last Year:   Transportation Needs:   . Film/video editor (Medical):   [name] Lack of Transportation (Non-Medical):   Physical Activity:   . Days of Exercise per Week:   . Minutes of Exercise per Session:   Stress:   . Feeling of Stress :   Social Connections:   . Frequency of Communication with Friends and Family:   . Frequency of Social Gatherings with Friends and Family:   . Attends Religious Services:   . Active Member of Clubs or Organizations:   . Attends Archivist Meetings:   [name] Marital Status:   Intimate Partner Violence:   . Fear of Current or Ex-Partner:   .  Emotionally Abused:   [name] Physically Abused:   . Sexually Abused:      Review of Systems    General:  No chills, fever, night sweats or weight changes.  Cardiovascular:  No chest pain, dyspnea on exertion, edema, orthopnea, palpitations, paroxysmal nocturnal dyspnea. Dermatological: No rash, lesions/masses Respiratory: No cough, dyspnea Urologic: No hematuria, dysuria Abdominal:   No nausea, vomiting, diarrhea, bright red blood per rectum, melena, or hematemesis Neurologic:  No visual changes, wkns, changes in mental status. All other systems reviewed and are otherwise negative except as noted above.  Physical Exam    Affect appropriate Healthy:  appears stated age 18: normal Neck supple with no adenopathy JVP normal  Right subclavian bruit  Lungs clear with no wheezing and good diaphragmatic motion Heart:  O3/Z8 click SEM  No AR  murmur, no rub, gallop or click PMI normal Abdomen: benighn, BS positve, no tenderness, post AortoBifem Femoral bruits   decreased pulses RLE  Post aorto bifem dicreased pedal pulses  Marked decrease pulse right brachial and radial compared to the left side  No edema Neuro non-focal Skin warm and dry No muscular weakness   Accessory Clinical Findings    ECG personally reviewed by me today-none today.  EKG 12/04/2019 Sinus rhythm first-degree AV block PACs and bigeminy pattern LVH prolonged QT 78 bpm  Echocardiogram 10/16/2019 IMPRESSIONS    1. Left ventricular ejection fraction, by visual estimation, is 40 to  45%. The left ventricle has mild to moderately decreased function. Normal  left ventricular size. There is mildly increased left ventricular  hypertrophy.  2. The left ventricle demonstrates global hypokinesis.  3. Global right ventricle has normal systolic function.The right  ventricular size is normal. No increase  in right ventricular wall  thickness.  4. Mildly dilated left atrium, no LA appendage thrombus.  5. Right  atrial size was normal.  6. The mitral valve is normal in structure. Trace mitral valve  regurgitation. No evidence of mitral stenosis.  7. The tricuspid valve is normal in structure. Tricuspid valve  regurgitation is trivial.  8. The pulmonic valve was normal in structure. Pulmonic valve  regurgitation is not visualized.  9. Mechanical aortic valve with no significant regurgitation or stenosis,  mean gradient 9 mmHg.  10. Normal caliber thoracic aorta with grade 3 plaque noted.  11. Peak RV-RA gradient 20 mmHg.   Cardiac catheterization 10/10/2019 Successful orbital atherectomy, PTCA, and stenting of the left main with a 4.0x15 mm Resolute Onyx DES  Recommend:   Resume heparin 8 hours after sheath out  Resume warfarin tonight  Continue ASA 30 days  Plavix at least 6 months if tolerated  Diagnostic Dominance: Right  Intervention     Lower extremity arterial duplex 07/12/2019 Patent right proximal SFA stent with elevated velocities in the proximal  stent segment; moderate increase when compared to the previous exam.    Summary:  Right: Prominent distal CFA, measuring 2.0 cm.  50-74% stenosis noted at the ostial superficial femoral artery.  Essentially stable >50% in-stent restenosis in the proximal stent segment.    No significant change compared to previous study.     See table(s) above for measurements and observations.    See ABI report and aortobifemoral results on the renal duplex report.   Suggest follow up study in 12 months.  ABIs 07/12/2019 Bilateral ABIs and TBIs appear essentially unchanged compared to prior  study on 07/12/2018.    Summary:  Right: Resting right ankle-brachial index indicates noncompressible right  lower extremity arteries. The right toe-brachial index is abnormal.   Left: Resting left ankle-brachial index indicates noncompressible left  lower extremity arteries. The left toe-brachial index is abnormal.      *See  table(s) above for measurements and observations.    See LE Arterial duplex report and aortobifemoral results on the renal  artery duplex report.   Suggest follow up study in 12 months.  Assessment & Plan   Chronic systolic heart failure- post intervention EF 40-45% continue entresto, lasix, aldactone and digoxin  CAD/CABG - angina improved CAth 10/10/19 patent SVG RCA, LIMA to LAD with some left to left collaterals to distal circumflex Post protected LM stenting by Dr Burt Knack Continue DAT indefinitely   Hyperlipidemia- LDL 61 continue Repatha and lipitor   Mechanical aortic valve-TEE 10/27/2019 showed normal function continue coumadin and SBE prophylaxis INR Rx 2.3 on 01/16/20   Peripheral arterial disease- limiting RLE claudication with proximal SFA stent stenosis He is scheduled for attempted intervention 02/29/20 with Dr Gwenlyn Found My concern is right subclavian bruit and faint right brachial and radial pulse  I personally discussed this with Dr Gwenlyn Found by phone and he will re evaluate prior to procedure Left brachial would be less optimal in terms of crossing his LIMA graft origin   Paroxysmal atrial fibrillation-DCCV 10/2019.  Heart rate 62 and regular. Continue amiodarone 200 mg dailyContinue warfarin 5 mg as directed by Coumadin clinic. Currently being transitioned to lovenox for vascular procedure In 48 hours INR today   Essential hypertension-well controlled on current CHF meds   F/U with me in 6 months F/U with Dr Gwenlyn Found post intervention 02/29/20  Jenkins Rouge MD [facility]

## 2020-02-14 NOTE — Progress Notes (Signed)
Cardiology Clinic Note   Patient Name: [name] [name] Date of Encounter: 02/27/2020  Primary Care Provider:  Shon Baton, MD Primary Cardiologist:  [name] Rouge, MD  Peripheral Vascular:  [name] [name]   Past Medical History    Past Medical History:  Diagnosis Date  . Adenomatous colon polyp 09/1997  . Anemia   . Aortic stenosis    s/p st. jude mechanical AVR - Chronic Coumadin  . Blood transfusion    "related to ITP"  . Coronary artery disease    s/p cabg x 3 11/2003: lima-lad, seq vg to rpda and rpl  . Diverticulitis of colon   . Heart murmur   . Hyperlipidemia   . Hypertension   . ITP (idiopathic thrombocytopenic purpura)   . Peripheral arterial disease ([place])    a. history of aortobifemoral bypass grafting by [name] [name] early b. LE angiography 04/22/2015 patent aortobifem graft, DES to R SFA  . Peripheral vascular disease ([place])    s/p Left external Iliac Artery stenting and subsequent left femoral endarterectomy 02/2011- post op course complicated by wound infxn req I&D 03/2011  . Renal artery stenosis, native, bilateral (HCC)    a. bilateral renal artery stenosis by recent duplex ultrasound b. L renal artery stent 02/2015, R renal artery patent on angiogram  . Stroke ([place])   . TIA (transient ischemic attack) ~ 2013  . Type II diabetes mellitus ([place])    Past Surgical History:  Procedure Laterality Date  . ABDOMINAL AORTAGRAM N/A 12/16/2011   Procedure: ABDOMINAL [name] [name];  Surgeon: [name] Mocha, MD;  Location: [facility] CATH LAB;  Service: Cardiovascular;  Laterality: N/A;  . ANGIOPLASTY / STENTING ILIAC     Left external Iliac Artery  . AORTA - BILATERAL FEMORAL ARTERY BYPASS GRAFT  01/18/2012   Procedure: AORTA BIFEMORAL BYPASS GRAFT;  Surgeon: [name] Jews, MD;  Location: [place];  Service: Vascular;  Laterality: N/A;  . AORTIC VALVE REPLACEMENT  ~ 2004  . CARDIAC CATHETERIZATION  11/2003   /pt report 10/01/2016  . CARDIAC VALVE REPLACEMENT  11/2003   aortic  .  CARDIOVERSION N/A 10/16/2019   Procedure: CARDIOVERSION;  Surgeon: [name] Dresser, MD;  Location: [facility] ENDOSCOPY;  Service: Cardiovascular;  Laterality: N/A;  . CATARACT EXTRACTION W/ INTRAOCULAR LENS  IMPLANT, BILATERAL Bilateral   . COLONOSCOPY    . COLONOSCOPY WITH PROPOFOL N/A 09/03/2019   Procedure: COLONOSCOPY WITH PROPOFOL;  Surgeon: [name] Stabler, MD;  Location: [facility] ENDOSCOPY;  Service: Gastroenterology;  Laterality: N/A;  . CORONARY ARTERY BYPASS GRAFT  11/2003   [name] [name] 04/21/2011  . CORONARY ATHERECTOMY N/A 10/10/2019   Procedure: CORONARY ATHERECTOMY;  Surgeon: [name] Mocha, MD;  Location: [place] CV LAB;  Service: Cardiovascular;  Laterality: N/A;  . CORONARY STENT INTERVENTION N/A 10/10/2019   Procedure: CORONARY STENT INTERVENTION;  Surgeon: [name] Mocha, MD;  Location: [place] CV LAB;  Service: Cardiovascular;  Laterality: N/A;  . CORONARY/GRAFT ANGIOGRAPHY N/A 09/18/2019   Procedure: CORONARY/GRAFT ANGIOGRAPHY;  Surgeon: [name] Harp, MD;  Location: [place] CV LAB;  Service: Cardiovascular;  Laterality: N/A;  . ESOPHAGOGASTRODUODENOSCOPY (EGD) WITH PROPOFOL N/A 02/27/2019   Procedure: ESOPHAGOGASTRODUODENOSCOPY (EGD) WITH PROPOFOL;  Surgeon: [name] Stabler, MD;  Location: [place];  Service: Endoscopy;  Laterality: N/A;  . HEMOSTASIS CLIP PLACEMENT  09/03/2019   Procedure: HEMOSTASIS CLIP PLACEMENT;  Surgeon: [name] Stabler, MD;  Location: [facility] ENDOSCOPY;  Service: Gastroenterology;;  . LOWER EXTREMITY ANGIOGRAM N/A 02/21/2015   Procedure: LOWER EXTREMITY ANGIOGRAM;  Surgeon: [name] Harp, MD;  Location: [facility] CATH LAB;  Service: Cardiovascular;  Laterality: N/A;  . PERIPHERAL VASCULAR CATHETERIZATION N/A 04/22/2015   Procedure: Lower Extremity Angiography;  Surgeon: [name] Harp, MD;  Location: [place] CV LAB;  Service: Cardiovascular;  Laterality: N/A;  . PERIPHERAL VASCULAR CATHETERIZATION N/A 08/24/2016   Procedure: Lower Extremity  Angiography;  Surgeon: [name] Harp, MD;  Location: [place] CV LAB;  Service: Cardiovascular;  Laterality: N/A;  . PERIPHERAL VASCULAR CATHETERIZATION Right 10/01/2016   Procedure: Peripheral Vascular Intervention - STENT;  Surgeon: [name] Harp, MD;  Location: [place] CV LAB;  Service: Cardiovascular;  Laterality: Right;  Prox and MID SFA   . POLYPECTOMY    . RENAL ANGIOGRAM N/A 02/21/2015   Procedure: RENAL ANGIOGRAM;  Surgeon: [name] Harp, MD;  Location: [name] CATH LAB;  Service: Cardiovascular;  Laterality: Bilateral; 6 mm x 12 mm long Herculink balloon expandable stent to the left renal artery  . RENAL ARTERY STENT Left 04/22/2015   [name] [name]  . SPLENECTOMY  02/2003   [name] [name] 04/21/2011  . TEE WITHOUT CARDIOVERSION N/A 10/16/2019   Procedure: TRANSESOPHAGEAL ECHOCARDIOGRAM (TEE);  Surgeon: [name] Dresser, MD;  Location: [facility] ENDOSCOPY;  Service: Cardiovascular;  Laterality: N/A;  . TEMPORARY PACEMAKER N/A 10/10/2019   Procedure: TEMPORARY PACEMAKER;  Surgeon: [name] Mocha, MD;  Location: [place] CV LAB;  Service: Cardiovascular;  Laterality: N/A;  . TONSILLECTOMY  ~ 1952    Allergies  No Known Allergies  History of Present Illness  [name] [name] has past medical history of essential hypertension, unstable angina, NSTEMI, CAD status post CABG, aortic valve disorder (mechanical AVR on Coumadin,, TIA, PVD, renal artery stenosis, DM, HLD, recurrent GI bleed, requires Lovenox bridging off heparin.  He had aortofemoral bypass in 2016 with a follow-up iliac stenting and femoral endarterectomy.  In 2017 he underwent subsequent ostial and mid right SFA intervention.  He had renal artery stenting in 2016 with repeat intervention on the left for 75% in-stent restenosis, noted progression of disease on the right renal artery showing 60% stenosis on duplex (07/2019).  His echocardiogram 09/2019 showed a normal EF 60-65%, and normal AVR function, chronic diastolic HF.  He was  admitted 10/20 with chest pain.  He underwent cardiac catheterization 09/18/2019 which showed 99% left main stenosis and patent LIMA to occluded LAD and a patent SVG to the PDA and PLA.  His left main stenosis jeopardized a moderately large ramus and a high first marginal and nondominant CX which had 90% mid AV groove CX stenosis.  Medical management was recommended at that time.  He was readmitted 10/20 with CHF and chest pain.  His hospital course was complicated by a flutter/low output heart failure.  Status post successful orbital arthrectomy, PTCA, and stenting of the protected left main with DES.  LIMA-LAD patent.  SVG-PDA patent.  His echocardiogram during that admission showed EF 30-35%, mechanical AoV okay.  A TEE 10/16/2019 showed improved EF 40-45%.  He was placed on milrinone initially with a low carboxyhemoglobin and later weaned off.  He was then cardioverted from atrial flutter back to NSR.  He saw [name]. Aundra Dubin for CHF follow-up 12/04/2019.  He remained in normal sinus rhythm at that time.  Still with right calf claudication 02/01/20 LE arterial duplex with high velocity 4.15 msec at proximal right SFA stent Seen by [name] Gwenlyn Found 02/06/20 note indicates percutaneous intervention may be difficult   His claudication is worsening on right LE. No angina Compliant  with meds  Being transitioned from coumadin to lovenox for procedure in 48 hours      Home Medications    Prior to Admission medications   Medication Sig Start Date End Date Taking? Authorizing Provider  amiodarone (PACERONE) 200 MG tablet Take 1 tablet (200 mg total) by mouth daily. 10/26/19   Clegg, Amy D, NP  amoxicillin (AMOXIL) 500 MG capsule TAKE 4 CAPSULES BY MOUTH 30 TO 60 MINTUES BEFORE APPOINTMENT. 06/08/19   [provider]  atorvastatin (LIPITOR) 80 MG tablet Take 80 mg by mouth daily.     [provider]  canagliflozin (INVOKANA) 300 MG TABS tablet Take 300 mg by mouth daily.     [provider]   clopidogrel (PLAVIX) 75 MG tablet Take 1 tablet (75 mg total) by mouth daily. 10/18/19   Clegg, Amy D, NP  digoxin (LANOXIN) 0.125 MG tablet TAKE 1 TABLET BY MOUTH DAILY 12/05/19   [name] Dresser, MD  Evolocumab (REPATHA SURECLICK) 638 MG/ML SOAJ Inject 140 mg into the skin every 14 (fourteen) days.    [provider]  furosemide (LASIX) 20 MG tablet Take 1 tablet (20 mg total) by mouth daily. 12/04/19   [name] Dresser, MD  glyBURIDE (DIABETA) 2.5 MG tablet Take 2.5 mg by mouth 2 (two) times daily with a meal.     [provider]  nitroGLYCERIN (NITROSTAT) 0.4 MG SL tablet Place 1 tablet (0.4 mg total) under the tongue every 5 (five) minutes x 3 doses as needed for chest pain. 09/19/19   Daune Perch, NP  pantoprazole (PROTONIX) 40 MG tablet Take 40 mg by mouth daily. 09/22/19   [provider]  potassium chloride SA (KLOR-CON) 20 MEQ tablet Take 1 tablet (20 mEq total) by mouth daily. 10/17/19   Clegg, Amy D, NP  sacubitril-valsartan (ENTRESTO) 24-26 MG Take 1 tablet by mouth 2 (two) times daily. 12/04/19   [name] Dresser, MD  spironolactone (ALDACTONE) 25 MG tablet Take 1 tablet (25 mg total) by mouth daily. 10/18/19   Clegg, Amy D, NP  warfarin (COUMADIN) 5 MG tablet Take as directed by the coumadin clinic    [provider]    Family History    Family History  Problem Relation Age of Onset  . Coronary artery disease Mother        bypass surgery - deceased  . Heart disease Father        murmur, valve replacement - deceased  . Breast cancer Sister   . Diabetes Other        grandmother  . Diabetes Paternal Grandmother   . Diabetes Paternal Aunt   . Colon cancer Neg Hx   . Colon polyps Neg Hx   . Esophageal cancer Neg Hx   . Rectal cancer Neg Hx   . Stomach cancer Neg Hx    He indicated that his mother is deceased. He indicated that his father is deceased. He indicated that the status of his sister is unknown. He indicated that his  paternal grandmother is deceased. He indicated that his paternal aunt is deceased. He indicated that the status of his neg hx is unknown. He indicated that the status of his other is unknown.  Social History    Social History   Socioeconomic History  . Marital status: Divorced    Spouse name: Not on file  . Number of children: 3  . Years of education: Not on file  . Highest education level: Not on file  Occupational History  . Occupation: Retired  Tobacco Use  . Smoking status: Light Tobacco Smoker    Years: 30.00    Types: Cigarettes, Cigars    Last attempt to quit: 12/15/1993    Years since quitting: 26.2  . Smokeless tobacco: Never Used  . Tobacco comment: occasional cigar  Substance and Sexual Activity  . Alcohol use: Yes    Alcohol/week: 16.0 standard drinks    Types: 1 Cans of beer, 1 Shots of liquor, 14 Standard drinks or equivalent per week    Comment: drinks 2 martini's a night (2 shots in each)  . Drug use: No  . Sexual activity: Yes  Other Topics Concern  . Not on file  Social History Narrative   Tries to remain active.  Frequent golfer but claudication limits this.   Social Determinants of Health   Financial Resource Strain:   . Difficulty of Paying Living Expenses:   Food Insecurity:   . Worried About Charity fundraiser in the Last Year:   . Arboriculturist in the Last Year:   Transportation Needs:   . Film/video editor (Medical):   [name] Lack of Transportation (Non-Medical):   Physical Activity:   . Days of Exercise per Week:   . Minutes of Exercise per Session:   Stress:   . Feeling of Stress :   Social Connections:   . Frequency of Communication with Friends and Family:   . Frequency of Social Gatherings with Friends and Family:   . Attends Religious Services:   . Active Member of Clubs or Organizations:   . Attends Archivist Meetings:   [name] Marital Status:   Intimate Partner Violence:   . Fear of Current or Ex-Partner:   .  Emotionally Abused:   [name] Physically Abused:   . Sexually Abused:      Review of Systems    General:  No chills, fever, night sweats or weight changes.  Cardiovascular:  No chest pain, dyspnea on exertion, edema, orthopnea, palpitations, paroxysmal nocturnal dyspnea. Dermatological: No rash, lesions/masses Respiratory: No cough, dyspnea Urologic: No hematuria, dysuria Abdominal:   No nausea, vomiting, diarrhea, bright red blood per rectum, melena, or hematemesis Neurologic:  No visual changes, wkns, changes in mental status. All other systems reviewed and are otherwise negative except as noted above.  Physical Exam    Affect appropriate Healthy:  appears stated age 74: normal Neck supple with no adenopathy JVP normal  Right subclavian bruit  Lungs clear with no wheezing and good diaphragmatic motion Heart:  U7/O5 click SEM  No AR  murmur, no rub, gallop or click PMI normal Abdomen: benighn, BS positve, no tenderness, post AortoBifem Femoral bruits   decreased pulses RLE  Post aorto bifem dicreased pedal pulses  Marked decrease pulse right brachial and radial compared to the left side  No edema Neuro non-focal Skin warm and dry No muscular weakness   Accessory Clinical Findings    ECG personally reviewed by me today-none today.  EKG 12/04/2019 Sinus rhythm first-degree AV block PACs and bigeminy pattern LVH prolonged QT 78 bpm  Echocardiogram 10/16/2019 IMPRESSIONS    1. Left ventricular ejection fraction, by visual estimation, is 40 to  45%. The left ventricle has mild to moderately decreased function. Normal  left ventricular size. There is mildly increased left ventricular  hypertrophy.  2. The left ventricle demonstrates global hypokinesis.  3. Global right ventricle has normal systolic function.The right  ventricular size is normal. No increase  in right ventricular wall  thickness.  4. Mildly dilated left atrium, no LA appendage thrombus.  5. Right  atrial size was normal.  6. The mitral valve is normal in structure. Trace mitral valve  regurgitation. No evidence of mitral stenosis.  7. The tricuspid valve is normal in structure. Tricuspid valve  regurgitation is trivial.  8. The pulmonic valve was normal in structure. Pulmonic valve  regurgitation is not visualized.  9. Mechanical aortic valve with no significant regurgitation or stenosis,  mean gradient 9 mmHg.  10. Normal caliber thoracic aorta with grade 3 plaque noted.  11. Peak RV-RA gradient 20 mmHg.   Cardiac catheterization 10/10/2019 Successful orbital atherectomy, PTCA, and stenting of the left main with a 4.0x15 mm Resolute Onyx DES  Recommend:   Resume heparin 8 hours after sheath out  Resume warfarin tonight  Continue ASA 30 days  Plavix at least 6 months if tolerated  Diagnostic Dominance: Right  Intervention     Lower extremity arterial duplex 07/12/2019 Patent right proximal SFA stent with elevated velocities in the proximal  stent segment; moderate increase when compared to the previous exam.    Summary:  Right: Prominent distal CFA, measuring 2.0 cm.  50-74% stenosis noted at the ostial superficial femoral artery.  Essentially stable >50% in-stent restenosis in the proximal stent segment.    No significant change compared to previous study.     See table(s) above for measurements and observations.    See ABI report and aortobifemoral results on the renal duplex report.   Suggest follow up study in 12 months.  ABIs 07/12/2019 Bilateral ABIs and TBIs appear essentially unchanged compared to prior  study on 07/12/2018.    Summary:  Right: Resting right ankle-brachial index indicates noncompressible right  lower extremity arteries. The right toe-brachial index is abnormal.   Left: Resting left ankle-brachial index indicates noncompressible left  lower extremity arteries. The left toe-brachial index is abnormal.      *See  table(s) above for measurements and observations.    See LE Arterial duplex report and aortobifemoral results on the renal  artery duplex report.   Suggest follow up study in 12 months.  Assessment & Plan   Chronic systolic heart failure- post intervention EF 40-45% continue entresto, lasix, aldactone and digoxin  CAD/CABG - angina improved CAth 10/10/19 patent SVG RCA, LIMA to LAD with some left to left collaterals to distal circumflex Post protected LM stenting by [name] Burt Knack Continue DAT indefinitely   Hyperlipidemia- LDL 47 continue Repatha and lipitor   Mechanical aortic valve-TEE 10/27/2019 showed normal function continue coumadin and SBE prophylaxis INR Rx 2.3 on 01/16/20   Peripheral arterial disease- limiting RLE claudication with proximal SFA stent stenosis He is scheduled for attempted intervention 02/29/20 with [name] Gwenlyn Found My concern is right subclavian bruit and faint right brachial and radial pulse  I personally discussed this with [name] Gwenlyn Found by phone and he will re evaluate prior to procedure Left brachial would be less optimal in terms of crossing his LIMA graft origin   Paroxysmal atrial fibrillation-DCCV 10/2019.  Heart rate 62 and regular. Continue amiodarone 200 mg dailyContinue warfarin 5 mg as directed by Coumadin clinic. Currently being transitioned to lovenox for vascular procedure In 48 hours INR today   Essential hypertension-well controlled on current CHF meds   F/U with me in 6 months F/U with [name] Gwenlyn Found post intervention 02/29/20  [name] Rouge MD [facility]

## 2020-02-14 NOTE — Patient Instructions (Signed)
Description   Take an extra tablet (5mg ) today, then start taking 1 tablet (5mg ) daily except for 2 tablets (10mg ) on Sundays and Thursdays.  Recheck INR in 2 weeks. Call coumadin Clinic for any changes in medications or upcoming procedures. [phone]

## 2020-02-14 NOTE — Telephone Encounter (Signed)
Spoke to patient Dr.Berry advised to schedule a office visit with him to discuss peripheral vascular intervention.Appointment scheduled 02/23/20 at 10:00 am.

## 2020-02-14 NOTE — Telephone Encounter (Signed)
New Message   Patient is calling because he was called to schedule an appt with Dr. Gwenlyn Found in May. I am not showing those notes. I show that he is to schedule in 6 months and he is to have another doppler study in 6 months as well. Please advise.

## 2020-02-20 DIAGNOSIS — I1310 Hypertensive heart and chronic kidney disease without heart failure, with stage 1 through stage 4 chronic kidney disease, or unspecified chronic kidney disease: Secondary | ICD-10-CM | POA: Diagnosis not present

## 2020-02-20 DIAGNOSIS — R82998 Other abnormal findings in urine: Secondary | ICD-10-CM | POA: Diagnosis not present

## 2020-02-20 DIAGNOSIS — H33309 Unspecified retinal break, unspecified eye: Secondary | ICD-10-CM | POA: Diagnosis not present

## 2020-02-20 DIAGNOSIS — N1831 Chronic kidney disease, stage 3a: Secondary | ICD-10-CM | POA: Diagnosis not present

## 2020-02-20 DIAGNOSIS — I5032 Chronic diastolic (congestive) heart failure: Secondary | ICD-10-CM | POA: Insufficient documentation

## 2020-02-20 DIAGNOSIS — E1122 Type 2 diabetes mellitus with diabetic chronic kidney disease: Secondary | ICD-10-CM | POA: Diagnosis not present

## 2020-02-20 DIAGNOSIS — J449 Chronic obstructive pulmonary disease, unspecified: Secondary | ICD-10-CM | POA: Diagnosis not present

## 2020-02-20 DIAGNOSIS — I739 Peripheral vascular disease, unspecified: Secondary | ICD-10-CM | POA: Diagnosis not present

## 2020-02-20 DIAGNOSIS — R972 Elevated prostate specific antigen [PSA]: Secondary | ICD-10-CM | POA: Diagnosis not present

## 2020-02-20 DIAGNOSIS — L97409 Non-pressure chronic ulcer of unspecified heel and midfoot with unspecified severity: Secondary | ICD-10-CM | POA: Diagnosis not present

## 2020-02-20 DIAGNOSIS — D649 Anemia, unspecified: Secondary | ICD-10-CM | POA: Diagnosis not present

## 2020-02-20 DIAGNOSIS — E1151 Type 2 diabetes mellitus with diabetic peripheral angiopathy without gangrene: Secondary | ICD-10-CM | POA: Diagnosis not present

## 2020-02-20 DIAGNOSIS — R809 Proteinuria, unspecified: Secondary | ICD-10-CM | POA: Diagnosis not present

## 2020-02-20 DIAGNOSIS — Z0000 Encounter for general adult medical examination without abnormal findings: Secondary | ICD-10-CM | POA: Diagnosis not present

## 2020-02-20 NOTE — Progress Notes (Signed)
[facility] PREP Weekly Session   Patient Details  Name: [name] [name] MRN: [id number] Date of Birth: Jul 19, 1946 Age: 74 y.o. PCP: Shon Baton, MD  Vitals:   02/20/20 1812  Weight: 168 lb (76.2 kg)    [facility] Weekly seesion - 02/20/20 1800      Weekly Session   Topic Discussed  Health habits    Minutes exercised this week  290 minutes   cardio: 3 hrs, strength 110   Classes attended to date  7      Fun things since last meeting: Play golf Grateful for: playing golf Nutrition celebration: made own meal  Barnett Hatter 02/20/2020, 6:13 PM

## 2020-02-23 ENCOUNTER — Encounter: Payer: Self-pay | Admitting: Cardiovascular Disease

## 2020-02-23 ENCOUNTER — Ambulatory Visit (HOSPITAL_COMMUNITY)
Admission: RE | Admit: 2020-02-23 | Discharge: 2020-02-23 | Disposition: A | Discharge status: Home / Self Care | Payer: Medicare Other | Source: Ambulatory Visit | Attending: Cardiology | Admitting: Cardiology

## 2020-02-23 ENCOUNTER — Other Ambulatory Visit: Payer: Self-pay

## 2020-02-23 ENCOUNTER — Encounter (HOSPITAL_COMMUNITY): Payer: Self-pay | Admitting: Cardiology

## 2020-02-23 ENCOUNTER — Other Ambulatory Visit: Payer: Self-pay | Admitting: *Deleted

## 2020-02-23 ENCOUNTER — Ambulatory Visit (INDEPENDENT_AMBULATORY_CARE_PROVIDER_SITE_OTHER): Payer: Medicare Other | Admitting: Cardiovascular Disease

## 2020-02-23 VITALS — BP 128/70 | HR 60 | Ht 68.0 in | Wt 172.0 lb

## 2020-02-23 VITALS — BP 144/43 | HR 57 | Wt 170.4 lb

## 2020-02-23 DIAGNOSIS — Z79899 Other long term (current) drug therapy: Secondary | ICD-10-CM | POA: Insufficient documentation

## 2020-02-23 DIAGNOSIS — Z8249 Family history of ischemic heart disease and other diseases of the circulatory system: Secondary | ICD-10-CM | POA: Insufficient documentation

## 2020-02-23 DIAGNOSIS — D693 Immune thrombocytopenic purpura: Secondary | ICD-10-CM | POA: Insufficient documentation

## 2020-02-23 DIAGNOSIS — I5031 Acute diastolic (congestive) heart failure: Secondary | ICD-10-CM | POA: Diagnosis not present

## 2020-02-23 DIAGNOSIS — E785 Hyperlipidemia, unspecified: Secondary | ICD-10-CM | POA: Insufficient documentation

## 2020-02-23 DIAGNOSIS — Z952 Presence of prosthetic heart valve: Secondary | ICD-10-CM | POA: Diagnosis not present

## 2020-02-23 DIAGNOSIS — I25810 Atherosclerosis of coronary artery bypass graft(s) without angina pectoris: Secondary | ICD-10-CM

## 2020-02-23 DIAGNOSIS — F17210 Nicotine dependence, cigarettes, uncomplicated: Secondary | ICD-10-CM | POA: Insufficient documentation

## 2020-02-23 DIAGNOSIS — R0989 Other specified symptoms and signs involving the circulatory and respiratory systems: Secondary | ICD-10-CM | POA: Insufficient documentation

## 2020-02-23 DIAGNOSIS — E1151 Type 2 diabetes mellitus with diabetic peripheral angiopathy without gangrene: Secondary | ICD-10-CM | POA: Diagnosis not present

## 2020-02-23 DIAGNOSIS — Z955 Presence of coronary angioplasty implant and graft: Secondary | ICD-10-CM | POA: Diagnosis not present

## 2020-02-23 DIAGNOSIS — Z7984 Long term (current) use of oral hypoglycemic drugs: Secondary | ICD-10-CM | POA: Diagnosis not present

## 2020-02-23 DIAGNOSIS — I255 Ischemic cardiomyopathy: Secondary | ICD-10-CM | POA: Diagnosis not present

## 2020-02-23 DIAGNOSIS — I5022 Chronic systolic (congestive) heart failure: Secondary | ICD-10-CM | POA: Diagnosis not present

## 2020-02-23 DIAGNOSIS — I110 Hypertensive heart disease with heart failure: Secondary | ICD-10-CM | POA: Insufficient documentation

## 2020-02-23 DIAGNOSIS — I252 Old myocardial infarction: Secondary | ICD-10-CM | POA: Diagnosis not present

## 2020-02-23 DIAGNOSIS — Z8673 Personal history of transient ischemic attack (TIA), and cerebral infarction without residual deficits: Secondary | ICD-10-CM | POA: Insufficient documentation

## 2020-02-23 DIAGNOSIS — Z833 Family history of diabetes mellitus: Secondary | ICD-10-CM | POA: Insufficient documentation

## 2020-02-23 DIAGNOSIS — I739 Peripheral vascular disease, unspecified: Secondary | ICD-10-CM

## 2020-02-23 DIAGNOSIS — Z7902 Long term (current) use of antithrombotics/antiplatelets: Secondary | ICD-10-CM | POA: Diagnosis not present

## 2020-02-23 DIAGNOSIS — Z951 Presence of aortocoronary bypass graft: Secondary | ICD-10-CM | POA: Insufficient documentation

## 2020-02-23 DIAGNOSIS — F17290 Nicotine dependence, other tobacco product, uncomplicated: Secondary | ICD-10-CM | POA: Insufficient documentation

## 2020-02-23 DIAGNOSIS — I2510 Atherosclerotic heart disease of native coronary artery without angina pectoris: Secondary | ICD-10-CM | POA: Diagnosis not present

## 2020-02-23 DIAGNOSIS — Z803 Family history of malignant neoplasm of breast: Secondary | ICD-10-CM | POA: Diagnosis not present

## 2020-02-23 DIAGNOSIS — I480 Paroxysmal atrial fibrillation: Secondary | ICD-10-CM | POA: Insufficient documentation

## 2020-02-23 DIAGNOSIS — Z7901 Long term (current) use of anticoagulants: Secondary | ICD-10-CM | POA: Insufficient documentation

## 2020-02-23 LAB — COMPREHENSIVE METABOLIC PANEL
ALT: 21 U/L (ref 0–44)
AST: 27 U/L (ref 15–41)
Albumin: 4.1 g/dL (ref 3.5–5.0)
Alkaline Phosphatase: 72 U/L (ref 38–126)
Anion gap: 12 (ref 5–15)
BUN: 30 mg/dL — ABNORMAL HIGH (ref 8–23)
CO2: 27 mmol/L (ref 22–32)
Calcium: 9 mg/dL (ref 8.9–10.3)
Chloride: 101 mmol/L (ref 98–111)
Creatinine, Ser: 1.65 mg/dL — ABNORMAL HIGH (ref 0.61–1.24)
GFR calc Af Amer: 47 mL/min — ABNORMAL LOW (ref >60)
GFR calc non Af Amer: 40 mL/min — ABNORMAL LOW (ref >60)
Glucose, Bld: 287 mg/dL — ABNORMAL HIGH (ref 70–99)
Potassium: 4.9 mmol/L (ref 3.5–5.1)
Sodium: 140 mmol/L (ref 135–145)
Total Bilirubin: 0.9 mg/dL (ref 0.3–1.2)
Total Protein: 7.1 g/dL (ref 6.5–8.1)

## 2020-02-23 LAB — CBC
HCT: 49.2 % (ref 39.0–52.0)
Hematocrit: 45.9 % (ref 37.5–51.0)
Hemoglobin: 15.7 g/dL (ref 13.0–17.7)
Hemoglobin: 15.7 g/dL (ref 13.0–17.0)
MCH: 30.2 pg (ref 26.6–33.0)
MCH: 29.5 pg (ref 26.0–34.0)
MCHC: 34.2 g/dL (ref 31.5–35.7)
MCHC: 31.9 g/dL (ref 30.0–36.0)
MCV: 88 fL (ref 79–97)
MCV: 92.3 fL (ref 80.0–100.0)
Platelets: 230 10*3/uL (ref 150–450)
Platelets: 239 10*3/uL (ref 150–400)
RBC: 5.2 x10E6/uL (ref 4.14–5.80)
RBC: 5.33 MIL/uL (ref 4.22–5.81)
RDW: 16.4 % — ABNORMAL HIGH (ref 11.6–15.4)
RDW: 19.7 % — ABNORMAL HIGH (ref 11.5–15.5)
WBC: 6.7 10*3/uL (ref 3.4–10.8)
WBC: 7 10*3/uL (ref 4.0–10.5)
nRBC: 0 % (ref 0.0–0.2)

## 2020-02-23 LAB — BASIC METABOLIC PANEL
BUN/Creatinine Ratio: 20 (ref 10–24)
BUN: 30 mg/dL — ABNORMAL HIGH (ref 8–27)
CO2: 24 mmol/L (ref 20–29)
Calcium: 9.5 mg/dL (ref 8.6–10.2)
Chloride: 98 mmol/L (ref 96–106)
Creatinine, Ser: 1.49 mg/dL — ABNORMAL HIGH (ref 0.76–1.27)
GFR calc Af Amer: 53 mL/min/{1.73_m2} — ABNORMAL LOW (ref >59)
GFR calc non Af Amer: 46 mL/min/{1.73_m2} — ABNORMAL LOW (ref >59)
Glucose: 193 mg/dL — ABNORMAL HIGH (ref 65–99)
Potassium: 5.4 mmol/L — ABNORMAL HIGH (ref 3.5–5.2)
Sodium: 138 mmol/L (ref 134–144)

## 2020-02-23 LAB — TSH: TSH: 1.814 u[IU]/mL (ref 0.350–4.500)

## 2020-02-23 MED ORDER — AMIODARONE HCL 100 MG PO TABS: 100.0000 mg | ORAL_TABLET | Freq: Every day | ORAL | 1 refills | Status: DC

## 2020-02-23 MED ORDER — CARVEDILOL 3.125 MG PO TABS: 3.1250 mg | ORAL_TABLET | Freq: Two times a day (BID) | ORAL | 3 refills | Status: DC

## 2020-02-23 MED ORDER — SODIUM CHLORIDE 0.9% FLUSH: 3.0000 mL | Freq: Two times a day (BID) | INTRAVENOUS | Status: DC

## 2020-02-23 NOTE — Patient Instructions (Addendum)
START Coreg 3.125mg  (1 tab) twice a day  DECREASE Amiodarone to 100mg  (1 tab) daily  You have been referred for an ultrasound of your carotid arteries.  You will get a call to schedule this appointment  Labs today We will only contact you if something comes back abnormal or we need to make some changes. Otherwise no news is good news!  Your physician has requested that you have an echocardiogram. Echocardiography is a painless test that uses sound waves to create images of your heart. It provides your doctor with information about the size and shape of your heart and how well your heart's chambers and valves are working. This procedure takes approximately one hour. There are no restrictions for this procedure.  Your physician recommends that you schedule a follow-up appointment in: 3 months with an ECHO and visit with Dr Aundra Dubin  Please call office at [phone] option 2 if you have any questions or concerns.   At the [facility], you and your health needs are our priority. As part of our continuing mission to provide you with exceptional heart care, we have created designated Provider Care Teams. These Care Teams include your primary Cardiologist (physician) and Advanced Practice Providers (APPs- Physician Assistants and Nurse Practitioners) who all work together to provide you with the care you need, when you need it.   You may see any of the following providers on your designated Care Team at your next follow up: [name] Dr Glori Bickers . Dr Loralie Champagne . Darrick Grinder, NP . Lyda Jester, PA . Audry Riles, PharmD   Please be sure to bring in all your medications bottles to every appointment.

## 2020-02-23 NOTE — Patient Instructions (Signed)
    [place] [address] Dept: [phone] Loc: [phone]  Jeran Hiltz  02/23/2020  You are scheduled for a Peripheral Angiogram on Thursday, March 25 with Dr. Quay Burow.  1. Please arrive at the [facility] (Main Entrance A) at [facility]: [address] [place], [place] [address] at 9:30 AM (This time is two hours before your procedure to ensure your preparation). Free valet parking service is available.   Special note: Every effort is made to have your procedure done on time. Please understand that emergencies sometimes delay scheduled procedures.  2. Diet: Do not eat solid foods after midnight.  The patient may have clear liquids until 5am upon the day of the procedure.  3. Labs: You will need to have blood drawn on TODAY  GO TO [place] Monday 02/26/20 @ 2:10 PM FOR COVID TESTING  4. Medication instructions in preparation for your procedure:  TAKE THE LAST DOSE OF WARFARIN Sunday 02-25-20  DO NOT TAKE GLYBURIDE AM OF PROCEDURE  DO NOT TAKE FUROSEMIDE AM OF PROCEDURE  On the morning of your procedure, take your Aspirin and any morning medicines NOT listed above.  You may use sips of water.  5. Plan for one night stay--bring personal belongings. 6. Bring a current list of your medications and current insurance cards. 7. You MUST have a responsible person to drive you home. 8. Someone MUST be with you the first 24 hours after you arrive home or your discharge will be delayed. 9. Please wear clothes that are easy to get on and off and wear slip-on shoes.  Thank you for allowing [place] to care for you!   -- [facility]  Your physician has requested that you have a lower extremity exercise duplex. During this test,  ultrasound are used to evaluate arterial blood flow in the legs. Allow one hour for this  exam. There are no restrictions or special instructions. [place] physician recommends that you schedule a follow-up appointment in: 2 [place]

## 2020-02-23 NOTE — Progress Notes (Signed)
[name] [name] returns today for follow-up. He does have a high-grade proximal right SFA in-stent restenosis just distal to his right limb of his aortobifemoral bypass graft anastomosis. He is symptomatic with lifestyle limiting claudication. He is on Coumadin for a Saint Jude AVR. My plan is to perform ultrasound-guided right brachial artery puncture, I will use a long sheath to get down to his right limb of his aortobifem and hopefully be able to cross the proximal right SFA with a drug-coated balloon. He will hold his Coumadin starting Monday of that week.  Lorretta Harp, M.D., [place], [facility], [place] [address] [place], [name]  [address]  [phone] 02/23/2020 10:29 AM

## 2020-02-25 NOTE — Progress Notes (Signed)
PCP: Shon Baton, MD HF Cardiology: Dr. Velora Heckler a 74 y.o.malewith a history of CAD s/p CABG, mechanical AVR on coumadin, extensive PAD, DM, HTN, HLD, ITP, TIA, and recurrent GI bleed. Note, requires lovenox bridging off heparin.  Dr. Gwenlyn Found follows him for extensive PAD. He has had an aortobifem bypass in 2016 with follow up iliac stenting and femoral endarterectomy. In 2017, he had subsequent ostial and mid right SFA intervention. Hx of left renal artery stenting in 2016 with repeat intervention on left for 75% in-stent restenosis, with progression of disease on the right renal artery showing 60% stenosis on duplex 07/12/19. Echo 09/17/19 with normal EF of 60-65% and normal AVR function; chronic diastolic heart failure.   Admitted in 10/20 with chest pain. He underwent heart cath 09/18/19 that showed 99% left main stenosis and patent LIMA to occluded LAD and a patent sequential vein to the PDA and PLA of an occluded dominant right. His left main stenosis jeopardized the moderately large ramus and high first marginal and nondominant Cx which has 90% mid AV groove Cx stenosis. Aggressive medical therapy was recommended initially.  He was readmitted in 10/20 with CHF and chest pain. Hospital course complicated by A flutter/low output heart failure.  S/P successful orbital atherectomy, PTCA, and stenting of protected left main with a 4.0x15 mm resolute onyx DES 11/3. LIMA-LAD patent. SVG-PDA patent. ECHO this admission showed EF 30-35%, mechanical AoV ok. TEE 11/9 w/ improved EF, 40-45%. Placed on milrinone initially with low co-ox and later weaned off. He was cardioverted from atrial flutter back to NSR.   2/21 peripheral arterial dopplers showed 75-99% stenosis right SFA.   He returns today for CHF followup.  He is in NSR, no palpitations.  Weight is stable.  Main complaint is right calf pain pain/claudication after walking 100-200 yards.  No BRBPR/melena.  No chest pain.  No  significant dyspnea, orthopnea, PND.     ECG (personally reviewed): NSR, LVH, inferolateral TWIs   Labs (11/20): K 4.3, creatinine 1.3, hgb 12.5 Labs (12/20): LDL 56, HDL 80, TGs 179 Labs (1/21): K 4.8, creatinine 1.45  PMH  1. CAD:  S/P CABG (3/04) with LIMA-LAD, seq SVG-PDA/PLV.  - NSTEMI 10/20 with cath showing patent LIMA-LAD and SVG-PDA/PLV but 99% LM stenosis, occluded LAD, moderate ramus and high OM1 jeopardized, also 90% mid AV groove LCx stenosis.  Occluded RCA.  Patient ultimately had orbital atherectomy and stenting of left main with a 4.0 x 15 mm Resolute Onyx DES.  2. Mechanical aortic valve: TEE 11/20 showed stable-appearing mechanical valve, mean gradient 9 mmHg.  3. PAD: Aortobifemoral bypass in 2016 with follow up iliac stenting and femoral endarterectomy. In 2017, he had subsequent ostial and mid right SFA intervention. - Angiography 10/20 with 80% in-stent restenosis in proximal right SFA.  - Peripheral arterial dopplers (2/21) with 75-99% R SFA stenosis in prior stented area.  4. Type 2 diabetes.  5. HTN 6. Hyperlipidemia 7. H/o ITP  8. TIA  9. Chronic Systolic HF: Ischemic cardiomyopathy.  - Echo (10/20): EF 30-35%, mechanical AoV ok.  - TEE (11/20): w/ improved EF, 40-45%. Mechanical aortic valve with mean gradient 9 mmHg, normal RV.  10. Atrial fibrillation: Paroxysmal.  11. Renal artery stenosis: Hx of left renal artery stenting in 2016 with repeat intervention on left for 75% in-stent restenosis, with progression of disease on the right renal artery showing 60% stenosis on duplex 07/12/19.  ROS: All systems negative except as listed in HPI, PMH  and Problem List.  Social History   Socioeconomic History  . Marital status: Divorced    Spouse name: Not on file  . Number of children: 3  . Years of education: Not on file  . Highest education level: Not on file  Occupational History  . Occupation: Retired  Tobacco Use  . Smoking status: Light Tobacco Smoker     Years: 30.00    Types: Cigarettes, Cigars    Last attempt to quit: 12/15/1993    Years since quitting: 26.2  . Smokeless tobacco: Never Used  . Tobacco comment: occasional cigar  Substance and Sexual Activity  . Alcohol use: Yes    Alcohol/week: 16.0 standard drinks    Types: 1 Cans of beer, 1 Shots of liquor, 14 Standard drinks or equivalent per week    Comment: drinks 2 martini's a night (2 shots in each)  . Drug use: No  . Sexual activity: Yes  Other Topics Concern  . Not on file  Social History Narrative   Tries to remain active.  Frequent golfer but claudication limits this.   Social Determinants of Health   Financial Resource Strain:   . Difficulty of Paying Living Expenses:   Food Insecurity:   . Worried About Charity fundraiser in the Last Year:   . Arboriculturist in the Last Year:   Transportation Needs:   . Film/video editor (Medical):   [name] Lack of Transportation (Non-Medical):   Physical Activity:   . Days of Exercise per Week:   . Minutes of Exercise per Session:   Stress:   . Feeling of Stress :   Social Connections:   . Frequency of Communication with Friends and Family:   . Frequency of Social Gatherings with Friends and Family:   . Attends Religious Services:   . Active Member of Clubs or Organizations:   . Attends Archivist Meetings:   [name] Marital Status:   Intimate Partner Violence:   . Fear of Current or Ex-Partner:   . Emotionally Abused:   [name] Physically Abused:   . Sexually Abused:    Family History  Problem Relation Age of Onset  . Coronary artery disease Mother        bypass surgery - deceased  . Heart disease Father        murmur, valve replacement - deceased  . Breast cancer Sister   . Diabetes Other        grandmother  . Diabetes Paternal Grandmother   . Diabetes Paternal Aunt   . Colon cancer Neg Hx   . Colon polyps Neg Hx   . Esophageal cancer Neg Hx   . Rectal cancer Neg Hx   . Stomach cancer Neg Hx     Current  Outpatient Medications  Medication Sig Dispense Refill  . amiodarone (PACERONE) 100 MG tablet Take 1 tablet (100 mg total) by mouth daily. 90 tablet 1  . amoxicillin (AMOXIL) 500 MG capsule Take 2,000 mg by mouth See admin instructions. Take 2000 mg by mouth 30-60 minutes before appointment    . atorvastatin (LIPITOR) 80 MG tablet Take 80 mg by mouth daily.     . canagliflozin (INVOKANA) 300 MG TABS tablet Take 300 mg by mouth daily.     . clopidogrel (PLAVIX) 75 MG tablet Take 1 tablet (75 mg total) by mouth daily. 30 tablet 6  . digoxin (LANOXIN) 0.125 MG tablet TAKE 1 TABLET BY MOUTH DAILY 90 tablet 3  . Evolocumab (  REPATHA SURECLICK) 315 MG/ML SOAJ Inject 140 mg into the skin every 14 (fourteen) days.    . furosemide (LASIX) 40 MG tablet Take 40 mg by mouth daily.    [name] glyBURIDE (DIABETA) 2.5 MG tablet Take 2.5 mg by mouth 2 (two) times daily with a meal.     . nitroGLYCERIN (NITROSTAT) 0.4 MG SL tablet Place 1 tablet (0.4 mg total) under the tongue every 5 (five) minutes x 3 doses as needed for chest pain. 25 tablet 12  . pantoprazole (PROTONIX) 40 MG tablet Take 40 mg by mouth daily.    . potassium chloride SA (KLOR-CON) 20 MEQ tablet Take 1 tablet (20 mEq total) by mouth daily. 30 tablet 6  . sacubitril-valsartan (ENTRESTO) 24-26 MG Take 1 tablet by mouth 2 (two) times daily. 60 tablet 5  . spironolactone (ALDACTONE) 25 MG tablet Take 1 tablet (25 mg total) by mouth daily. 30 tablet 6  . warfarin (COUMADIN) 5 MG tablet Take 5-10 mg by mouth See admin instructions. Take 10 mg by mouth on Sunday and Thursday, all remaining days take 5 mg daily    . carvedilol (COREG) 3.125 MG tablet Take 1 tablet (3.125 mg total) by mouth 2 (two) times daily. 180 tablet 3   Current Facility-Administered Medications  Medication Dose Route Frequency Provider Last Rate Last Admin  . sodium chloride flush (NS) 0.9 % injection 3 mL  3 mL Intravenous Q12H Lorretta Harp, MD        Vitals:   02/23/20 1434   BP: (!) 144/43  Pulse: (!) 57  SpO2: 94%  Weight: 77.3 kg (170 lb 6.4 oz)   Wt Readings from Last 3 Encounters:  02/23/20 77.3 kg (170 lb 6.4 oz)  02/23/20 78 kg (172 lb)  02/20/20 76.2 kg (168 lb)    PHYSICAL EXAM: General: NAD Neck: No JVD, no thyromegaly or thyroid nodule.  Lungs: Clear to auscultation bilaterally with normal respiratory effort. CV: Nondisplaced PMI.  Heart regular S1/S2 with mechanical S2, no S3/S4, 2/6 SEM RUSB.  No peripheral edema.  Right carotid bruit.  Unable to palpate right foot pulses.  Abdomen: Soft, nontender, no hepatosplenomegaly, no distention.  Skin: Intact without lesions or rashes.  Neurologic: Alert and oriented x 3.  Psych: Normal affect. Extremities: No clubbing or cyanosis.  HEENT: Normal.    ASSESSMENT & PLAN: 1. Chronic Systolic CHF: Ischemic cardiomyopathy. Echo 10/06/19 showed EF 30-35% and wall motion abnormalities, prior echo earlier in 10/20 showed EF 50-55%. Cath 10/20 showed jeopardized ramus and LCx territory (99% distal left main, occluded LAD, patent LIMA-LAD). Concerned that ischemia in this territory triggered CHF, worsening of LV function.  Now s/p DES to left main, TEE in 11/20 after intervention showed EF higher at 40-45%.  NYHA class II symptoms, not volume overloaded on exam.  - Continue Entresto 24/26 bid.  BMET today.   - Continue Lasix 40 mg daily.  - Continue spironolactone and digoxin, check digoxin level today.  - Add Coreg 3.125 mg bid.  - Echo at 3 month followup.  2. CAD: Complicated disease, coronary angiography 10/20 showed patent SVG-RCA territory and patent LIMA-LAD. The proximal LAD was occluded and there was 99% distal left main stenosis. This left the ramus and LCx in jeopardy. He was not a good candidate for protected PCI =>cannot place Impella with mechanical aortic valve and peripheral vascular disease likely precludes a femoral IABP. It was decided to try to manage him medically. However, he  returned with progressive chest  pain episodes and NSTEMI, hs-TnI up to 935. S/p successful orbital atherectomy and stenting of the left main with a 4.0x15 mm Resolute Onyx DES 10/10/19.No chest pain.   - Off ASA, continue Plavix for 1 year.  - Continue warfarin for mechanical valve.    - Continue atorvastatin 80 daily + Repatha.  Good LDL in 12/20.  3. Mechanical aortic valve: Stable on 11/20 TEE. Crisp mechanical sounds on exam - Off ASA given Plavix use.  - Continue warfarin and will aim for INR 2.5-3 while on Plavix.  4. NSVT: Noted 10/20 admission, on amiodarone.  5. H/o GI bleeding: Continue PPI for GI protection. 6. PAD: Patient with aorto-bifemoral bypass, h/o iliac stenting, h/o femoral endarterectomy, PCI to right SFA in 2017.  Angiography in 10/20 with 80% ISR in right SFA.  He has significant claudication, recent peripheral arterial dopplers showed significant right SFA ISR.   - Planning for abdominal angiography by Dr. Gwenlyn Found.  7. H/o renal artery stenosis.  8. DM2: He is on SGLT2 inhibitor (canagliflozin).  10. Atrial fibrillation: Paroxysmal.  TEE-guided DCCV in 11/20, he remains in NSR.  - Decrease amiodarone to 100 mg daily, eventually would like him evaluated by EP for possible ablation. Check LFTs, TSH today.  He will need a regular eye exam.   -Continue coumadin.  11. Carotid bruit: I will arrange peripheral arterial dopplers.   Followup 3 months with echo.   Loralie Champagne  02/25/2020

## 2020-02-26 ENCOUNTER — Telehealth (HOSPITAL_COMMUNITY): Payer: Self-pay

## 2020-02-26 ENCOUNTER — Other Ambulatory Visit (HOSPITAL_COMMUNITY)
Admission: RE | Admit: 2020-02-26 | Discharge: 2020-02-26 | Disposition: A | Discharge status: Home / Self Care | Payer: Medicare Other | Source: Ambulatory Visit | Attending: Cardiovascular Disease | Admitting: Cardiovascular Disease

## 2020-02-26 ENCOUNTER — Ambulatory Visit: Payer: Medicare Other

## 2020-02-26 DIAGNOSIS — Z7901 Long term (current) use of anticoagulants: Secondary | ICD-10-CM | POA: Diagnosis not present

## 2020-02-26 DIAGNOSIS — Z20822 Contact with and (suspected) exposure to covid-19: Secondary | ICD-10-CM | POA: Insufficient documentation

## 2020-02-26 DIAGNOSIS — I110 Hypertensive heart disease with heart failure: Secondary | ICD-10-CM | POA: Diagnosis not present

## 2020-02-26 DIAGNOSIS — Z8673 Personal history of transient ischemic attack (TIA), and cerebral infarction without residual deficits: Secondary | ICD-10-CM | POA: Diagnosis not present

## 2020-02-26 DIAGNOSIS — I5022 Chronic systolic (congestive) heart failure: Secondary | ICD-10-CM | POA: Diagnosis not present

## 2020-02-26 DIAGNOSIS — E1151 Type 2 diabetes mellitus with diabetic peripheral angiopathy without gangrene: Secondary | ICD-10-CM | POA: Diagnosis not present

## 2020-02-26 DIAGNOSIS — Z01812 Encounter for preprocedural laboratory examination: Secondary | ICD-10-CM | POA: Insufficient documentation

## 2020-02-26 DIAGNOSIS — Z952 Presence of prosthetic heart valve: Secondary | ICD-10-CM | POA: Diagnosis not present

## 2020-02-26 DIAGNOSIS — Z7984 Long term (current) use of oral hypoglycemic drugs: Secondary | ICD-10-CM | POA: Diagnosis not present

## 2020-02-26 DIAGNOSIS — I70211 Atherosclerosis of native arteries of extremities with intermittent claudication, right leg: Secondary | ICD-10-CM | POA: Diagnosis not present

## 2020-02-26 DIAGNOSIS — Z87891 Personal history of nicotine dependence: Secondary | ICD-10-CM | POA: Diagnosis not present

## 2020-02-26 DIAGNOSIS — I2510 Atherosclerotic heart disease of native coronary artery without angina pectoris: Secondary | ICD-10-CM | POA: Diagnosis not present

## 2020-02-26 DIAGNOSIS — I701 Atherosclerosis of renal artery: Secondary | ICD-10-CM | POA: Diagnosis not present

## 2020-02-26 DIAGNOSIS — I480 Paroxysmal atrial fibrillation: Secondary | ICD-10-CM | POA: Diagnosis not present

## 2020-02-26 DIAGNOSIS — E785 Hyperlipidemia, unspecified: Secondary | ICD-10-CM | POA: Diagnosis not present

## 2020-02-26 DIAGNOSIS — Z79899 Other long term (current) drug therapy: Secondary | ICD-10-CM | POA: Diagnosis not present

## 2020-02-26 DIAGNOSIS — Z951 Presence of aortocoronary bypass graft: Secondary | ICD-10-CM | POA: Diagnosis not present

## 2020-02-26 DIAGNOSIS — Z7902 Long term (current) use of antithrombotics/antiplatelets: Secondary | ICD-10-CM | POA: Diagnosis not present

## 2020-02-26 NOTE — Progress Notes (Deleted)
3/21: Last dose of Coumadin.   3/22: No Coumadin or Lovenox.   3/23: Inject Lovenox 120 mg in the fatty abdominal tissue at least 2 inches from the belly button 9pm rotate sites. No Coumadin.   3/24: No Lovenox, No Coumadin.   3/25: Procedure Day - No Lovenox - Resume Coumadin in the evening or as directed by doctor (take an extra half tablet with usual dose for 2 days then resume normal dose).   3/26: Resume Lovenox inject at 8 AM in the fatty tissue every 24 hours and take Coumadin.   3/27: Inject Lovenox in the fatty tissue every 24hours and take Coumadin.   3/28: Inject Lovenox in the fatty tissue every 24 hours and take Coumadin.   3/29: Inject Lovenox in the fatty tissue every 24 hours and take Coumadin.   3/30: Inject Lovenox in the fatty tissue every 24 hours and take Coumadin.   3/31: Coumadin appt to check INR.

## 2020-02-26 NOTE — Progress Notes (Signed)
  3/21: Last dose of Coumadin.  3/22: No Coumadin or Lovenox.  3/23: Inject Lovenox 120 mg in the fatty abdominal tissue at least 2 inches from the belly button 9pm rotate sites. No Coumadin.  3/24: No Lovenox, No Coumadin.  3/25: Procedure Day - No Lovenox - Resume Coumadin in the evening or as directed by doctor (take an extra half tablet with usual dose for 2 days then resume normal dose).  3/26: Resume Lovenox inject at 8 AM in the fatty tissue every 24 hours and take Coumadin.  3/27: Inject Lovenox in the fatty tissue every 24hours and take Coumadin.  3/28: Inject Lovenox in the fatty tissue every 24 hours and take Coumadin.  3/29: Inject Lovenox in the fatty tissue every 24 hours and take Coumadin.  3/30: Inject Lovenox in the fatty tissue every 24 hours and take Coumadin.  3/31: Coumadin appt to check INR.

## 2020-02-26 NOTE — Telephone Encounter (Signed)
-----   Message from Larey Dresser, MD sent at 02/25/2020 11:42 PM EDT ----- No change for now but would repeat BMET in 2 wks with elevated creatinine.

## 2020-02-27 ENCOUNTER — Encounter: Payer: Self-pay | Admitting: Cardiovascular Disease

## 2020-02-27 ENCOUNTER — Ambulatory Visit (INDEPENDENT_AMBULATORY_CARE_PROVIDER_SITE_OTHER): Payer: Medicare Other | Admitting: Cardiovascular Disease

## 2020-02-27 ENCOUNTER — Other Ambulatory Visit: Payer: Self-pay

## 2020-02-27 ENCOUNTER — Ambulatory Visit (INDEPENDENT_AMBULATORY_CARE_PROVIDER_SITE_OTHER): Payer: Medicare Other | Admitting: *Deleted

## 2020-02-27 VITALS — BP 112/78 | HR 51 | Ht 68.0 in | Wt 170.0 lb

## 2020-02-27 DIAGNOSIS — Z5181 Encounter for therapeutic drug level monitoring: Secondary | ICD-10-CM | POA: Diagnosis not present

## 2020-02-27 DIAGNOSIS — G459 Transient cerebral ischemic attack, unspecified: Secondary | ICD-10-CM

## 2020-02-27 DIAGNOSIS — I25810 Atherosclerosis of coronary artery bypass graft(s) without angina pectoris: Secondary | ICD-10-CM

## 2020-02-27 DIAGNOSIS — I359 Nonrheumatic aortic valve disorder, unspecified: Secondary | ICD-10-CM

## 2020-02-27 DIAGNOSIS — I739 Peripheral vascular disease, unspecified: Secondary | ICD-10-CM | POA: Diagnosis not present

## 2020-02-27 DIAGNOSIS — Z952 Presence of prosthetic heart valve: Secondary | ICD-10-CM | POA: Diagnosis not present

## 2020-02-27 LAB — POCT INR: INR: 2.1 (ref 2.0–3.0)

## 2020-02-27 LAB — SARS CORONAVIRUS 2 (TAT 6-24 HRS): SARS Coronavirus 2: NEGATIVE

## 2020-02-27 MED ORDER — ENOXAPARIN SODIUM 120 MG/0.8ML ~~LOC~~ SOLN: 120.0000 mg | SUBCUTANEOUS | 1 refills | Status: DC

## 2020-02-27 NOTE — Progress Notes (Signed)
[facility] [employer] PREP Weekly Session   Patient Details  Name: Berley Gambrell MRN: [id number] Date of Birth: [birth year] Age: 74 y.o. PCP: Shon Baton, MD  Vitals:   02/27/20 1619  Weight: 168 lb (76.2 kg)    [facility] [employer] Weekly seesion - 02/27/20 1600      Weekly Session   Topic Discussed  Restaurant Eating   salt demo   Minutes exercised this week  210 minutes    Classes attended to date  9      Nutrition celebration: drank margarita without salt  Pam Tally Joe 02/27/2020, 4:20 PM

## 2020-02-27 NOTE — Patient Instructions (Signed)
Medication Instructions:  *If you need a refill on your cardiac medications before your next appointment, please call your pharmacy*  Lab Work: If you have labs (blood work) drawn today and your tests are completely normal, you will receive your results only by: [name] MyChart Message (if you have MyChart) OR . A paper copy in the mail If you have any lab test that is abnormal or we need to change your treatment, we will call you to review the results.  Testing/Procedures:   Follow-Up: At [facility], you and your health needs are our priority.  As part of our continuing mission to provide you with exceptional heart care, we have created designated Provider Care Teams.  These Care Teams include your primary Cardiologist (physician) and Advanced Practice Providers (APPs -  Physician Assistants and Nurse Practitioners) who all work together to provide you with the care you need, when you need it.  We recommend signing up for the patient portal called "MyChart".  Sign up information is provided on this After Visit Summary.  MyChart is used to connect with patients for Virtual Visits (Telemedicine).  Patients are able to view lab/test results, encounter notes, upcoming appointments, etc.  Non-urgent messages can be sent to your provider as well.   To learn more about what you can do with MyChart, go to [email or web address].    Your next appointment:   6 month(s)  The format for your next appointment:   In Person  Provider:   You may see Jenkins Rouge, MD or one of the following Advanced Practice Providers on your designated Care Team:    Truitt Merle, NP  Cecilie Kicks, NP  Kathyrn Drown, NP

## 2020-02-27 NOTE — Patient Instructions (Addendum)
Description   Continue to hold for procedure on 02/29/2020. After procedure continue taking 1 tablet (5mg ) daily except for 2 tablets (10mg ) on Sundays and Thursdays. Recheck INR in 1 week post procedure. Call coumadin Clinic for any changes in medications or upcoming procedures. [phone]     3/21: Last dose of Coumadin.  3/22: No Coumadin or Lovenox.  3/23: Inject Lovenox 120 mg in the fatty abdominal tissue at least 2 inches from the belly button 9pm rotate sites. No Coumadin.  3/24: No Lovenox, No Coumadin.  3/25: Procedure Day - No Lovenox - Resume Coumadin in the evening or as directed by doctor (take an extra half tablet with usual dose for 2 days then resume normal dose).  3/26: Resume Lovenox inject at 8 AM in the fatty tissue every 24 hours and take Coumadin.  3/27: Inject Lovenox in the fatty tissue every 24hours and take Coumadin.  3/28: Inject Lovenox in the fatty tissue every 24 hours and take Coumadin.  3/29: Inject Lovenox in the fatty tissue every 24 hours and take Coumadin.  3/30: Inject Lovenox in the fatty tissue every 24 hours and take Coumadin.  3/31: Coumadin appt to check INR.

## 2020-02-28 ENCOUNTER — Telehealth: Payer: Self-pay | Admitting: *Deleted

## 2020-02-28 NOTE — Telephone Encounter (Addendum)
Pt contacted pre-abdominal aortogram scheduled at [facility] for: Thursday February 29, 2020 11:30 AM Verified arrival time and place: [facility]) at: 9:30 AM   No solid food after midnight prior to cath, clear liquids until 5 AM day of procedure. Contrast allergy: no  Hold: Coumadin-see Anti-Coag Visit notes 02/27/20 Lasix/KCl-day before and day of procedure. Spironolactone-day before and day of procedure Entresto-day before and day of procedure. Invokana- AM of procedure Glyburide-AM of procedure  Except hold medications AM meds can be  taken pre-cath with sip of water including: ASA 81 mg Plavix 75 mg  Confirmed patient has responsible adult to drive home post procedure and observe 24 hours after arriving home: yes  Currently, due to Covid-19 pandemic, only one person will be allowed with patient. Must be the same person for patient's entire stay and will be required to wear a mask. They will be asked to wait in the waiting room for the duration of the patient's stay.  Patients are required to wear a mask when they enter the hospital.      COVID-19 Pre-Screening Questions:  . In the past 7 to 10 days have you had a cough,  shortness of breath, headache, congestion, fever (100 or greater) body aches, chills, sore throat, or sudden loss of taste or sense of smell? no . Have you been around anyone with known Covid 19 in the past 7-10 days? no . Have you been around anyone who is awaiting Covid 19 test results in the past 7 to 10 days? no . Have you been around anyone who has been exposed to Covid 19, or has mentioned symptoms of Covid 19 within the past 7 to 10 days? no  Reviewed procedure instructions with patient.

## 2020-02-29 ENCOUNTER — Encounter (HOSPITAL_COMMUNITY)
Admission: AD | Disposition: A | Discharge status: Home / Self Care | Payer: Self-pay | Source: Home / Self Care | Attending: Cardiovascular Disease

## 2020-02-29 ENCOUNTER — Ambulatory Visit (HOSPITAL_COMMUNITY)
Admission: AD | Admit: 2020-02-29 | Discharge: 2020-03-01 | Disposition: A | Discharge status: Home / Self Care | Payer: Medicare Other | Attending: Cardiovascular Disease | Admitting: Cardiovascular Disease

## 2020-02-29 ENCOUNTER — Other Ambulatory Visit: Payer: Self-pay

## 2020-02-29 DIAGNOSIS — I701 Atherosclerosis of renal artery: Secondary | ICD-10-CM | POA: Insufficient documentation

## 2020-02-29 DIAGNOSIS — I70211 Atherosclerosis of native arteries of extremities with intermittent claudication, right leg: Secondary | ICD-10-CM | POA: Insufficient documentation

## 2020-02-29 DIAGNOSIS — E785 Hyperlipidemia, unspecified: Secondary | ICD-10-CM | POA: Insufficient documentation

## 2020-02-29 DIAGNOSIS — Z951 Presence of aortocoronary bypass graft: Secondary | ICD-10-CM | POA: Insufficient documentation

## 2020-02-29 DIAGNOSIS — Z20822 Contact with and (suspected) exposure to covid-19: Secondary | ICD-10-CM | POA: Insufficient documentation

## 2020-02-29 DIAGNOSIS — Z7902 Long term (current) use of antithrombotics/antiplatelets: Secondary | ICD-10-CM | POA: Insufficient documentation

## 2020-02-29 DIAGNOSIS — I2510 Atherosclerotic heart disease of native coronary artery without angina pectoris: Secondary | ICD-10-CM | POA: Insufficient documentation

## 2020-02-29 DIAGNOSIS — Z87891 Personal history of nicotine dependence: Secondary | ICD-10-CM | POA: Insufficient documentation

## 2020-02-29 DIAGNOSIS — I5022 Chronic systolic (congestive) heart failure: Secondary | ICD-10-CM | POA: Insufficient documentation

## 2020-02-29 DIAGNOSIS — Z952 Presence of prosthetic heart valve: Secondary | ICD-10-CM | POA: Insufficient documentation

## 2020-02-29 DIAGNOSIS — Z7901 Long term (current) use of anticoagulants: Secondary | ICD-10-CM | POA: Insufficient documentation

## 2020-02-29 DIAGNOSIS — I480 Paroxysmal atrial fibrillation: Secondary | ICD-10-CM | POA: Diagnosis not present

## 2020-02-29 DIAGNOSIS — E1151 Type 2 diabetes mellitus with diabetic peripheral angiopathy without gangrene: Secondary | ICD-10-CM | POA: Diagnosis not present

## 2020-02-29 DIAGNOSIS — I110 Hypertensive heart disease with heart failure: Secondary | ICD-10-CM | POA: Insufficient documentation

## 2020-02-29 DIAGNOSIS — Z8673 Personal history of transient ischemic attack (TIA), and cerebral infarction without residual deficits: Secondary | ICD-10-CM | POA: Insufficient documentation

## 2020-02-29 DIAGNOSIS — I739 Peripheral vascular disease, unspecified: Secondary | ICD-10-CM | POA: Diagnosis present

## 2020-02-29 DIAGNOSIS — Z7984 Long term (current) use of oral hypoglycemic drugs: Secondary | ICD-10-CM | POA: Insufficient documentation

## 2020-02-29 DIAGNOSIS — Z79899 Other long term (current) drug therapy: Secondary | ICD-10-CM | POA: Insufficient documentation

## 2020-02-29 HISTORY — PX: ABDOMINAL AORTOGRAM W/LOWER EXTREMITY: CATH118223

## 2020-02-29 HISTORY — PX: PERIPHERAL VASCULAR INTERVENTION: CATH118257

## 2020-02-29 LAB — POCT ACTIVATED CLOTTING TIME
Activated Clotting Time: 307 seconds
Activated Clotting Time: 235 seconds
Activated Clotting Time: 180 seconds
Activated Clotting Time: 197 seconds

## 2020-02-29 LAB — GLUCOSE, CAPILLARY
Glucose-Capillary: 220 mg/dL — ABNORMAL HIGH (ref 70–99)
Glucose-Capillary: 137 mg/dL — ABNORMAL HIGH (ref 70–99)

## 2020-02-29 LAB — PROTIME-INR
INR: 1.2 (ref 0.8–1.2)
INR: 1.2 (ref 0.8–1.2)
Prothrombin Time: 14.8 seconds (ref 11.4–15.2)
Prothrombin Time: 14.8 seconds (ref 11.4–15.2)

## 2020-02-29 SURGERY — ABDOMINAL AORTOGRAM W/LOWER EXTREMITY
Anesthesia: LOCAL | Laterality: Right

## 2020-02-29 MED ORDER — WARFARIN SODIUM 10 MG PO TABS
10.0000 mg | ORAL_TABLET | Freq: Once | ORAL | Status: AC
  Administered 2020-02-29: 10 mg via ORAL
  Filled 2020-02-29: qty 1

## 2020-02-29 MED ORDER — SPIRONOLACTONE 25 MG PO TABS
25.0000 mg | ORAL_TABLET | Freq: Every day | ORAL | Status: DC
  Administered 2020-03-01: 25 mg via ORAL
  Filled 2020-02-29: qty 1

## 2020-02-29 MED ORDER — ASPIRIN EC 81 MG PO TBEC
81.0000 mg | DELAYED_RELEASE_TABLET | Freq: Every day | ORAL | Status: DC
  Administered 2020-03-01: 81 mg via ORAL
  Filled 2020-02-29: qty 1

## 2020-02-29 MED ORDER — AMIODARONE HCL 100 MG PO TABS
100.0000 mg | ORAL_TABLET | Freq: Every day | ORAL | Status: DC
  Administered 2020-03-01: 100 mg via ORAL
  Filled 2020-02-29: qty 1

## 2020-02-29 MED ORDER — HEPARIN (PORCINE) IN NACL 1000-0.9 UT/500ML-% IV SOLN
INTRAVENOUS | Status: DC | PRN
  Administered 2020-02-29 (×2): 500 mL

## 2020-02-29 MED ORDER — PANTOPRAZOLE SODIUM 40 MG PO TBEC
40.0000 mg | DELAYED_RELEASE_TABLET | Freq: Every day | ORAL | Status: DC
  Administered 2020-03-01: 40 mg via ORAL
  Filled 2020-02-29: qty 1

## 2020-02-29 MED ORDER — MORPHINE SULFATE (PF) 2 MG/ML IV SOLN: 2.0000 mg | INTRAVENOUS | Status: DC | PRN

## 2020-02-29 MED ORDER — ATROPINE SULFATE 1 MG/10ML IJ SOSY
PREFILLED_SYRINGE | INTRAMUSCULAR | Status: DC | PRN
  Administered 2020-02-29: 0.5 mg via INTRAVENOUS

## 2020-02-29 MED ORDER — LIDOCAINE HCL (PF) 1 % IJ SOLN
INTRAMUSCULAR | Status: AC
  Filled 2020-02-29: qty 30

## 2020-02-29 MED ORDER — SODIUM CHLORIDE 0.9% FLUSH: 3.0000 mL | INTRAVENOUS | Status: DC | PRN

## 2020-02-29 MED ORDER — HYDRALAZINE HCL 20 MG/ML IJ SOLN: 5.0000 mg | INTRAMUSCULAR | Status: DC | PRN

## 2020-02-29 MED ORDER — CARVEDILOL 3.125 MG PO TABS
3.1250 mg | ORAL_TABLET | Freq: Two times a day (BID) | ORAL | Status: DC
  Filled 2020-02-29 (×2): qty 1

## 2020-02-29 MED ORDER — HEPARIN SODIUM (PORCINE) 1000 UNIT/ML IJ SOLN
INTRAMUSCULAR | Status: DC | PRN
  Administered 2020-02-29: 3000 [IU] via INTRAVENOUS
  Administered 2020-02-29: 8000 [IU] via INTRAVENOUS

## 2020-02-29 MED ORDER — SACUBITRIL-VALSARTAN 24-26 MG PO TABS
1.0000 | ORAL_TABLET | Freq: Two times a day (BID) | ORAL | Status: DC
  Administered 2020-02-29 – 2020-03-01 (×2): 1 via ORAL
  Filled 2020-02-29 (×2): qty 1

## 2020-02-29 MED ORDER — GLYBURIDE 2.5 MG PO TABS
2.5000 mg | ORAL_TABLET | Freq: Two times a day (BID) | ORAL | Status: DC
  Administered 2020-03-01: 2.5 mg via ORAL
  Filled 2020-02-29: qty 1

## 2020-02-29 MED ORDER — MIDAZOLAM HCL 2 MG/2ML IJ SOLN
INTRAMUSCULAR | Status: AC
  Filled 2020-02-29: qty 2

## 2020-02-29 MED ORDER — DIGOXIN 125 MCG PO TABS
125.0000 ug | ORAL_TABLET | Freq: Every day | ORAL | Status: DC
  Filled 2020-02-29: qty 1

## 2020-02-29 MED ORDER — HEPARIN SODIUM (PORCINE) 1000 UNIT/ML IJ SOLN
INTRAMUSCULAR | Status: AC
  Filled 2020-02-29: qty 1

## 2020-02-29 MED ORDER — FENTANYL CITRATE (PF) 100 MCG/2ML IJ SOLN
INTRAMUSCULAR | Status: AC
  Filled 2020-02-29: qty 2

## 2020-02-29 MED ORDER — CLOPIDOGREL BISULFATE 75 MG PO TABS
75.0000 mg | ORAL_TABLET | Freq: Every day | ORAL | Status: DC
  Administered 2020-03-01: 75 mg via ORAL
  Filled 2020-02-29: qty 1

## 2020-02-29 MED ORDER — HEPARIN (PORCINE) IN NACL 1000-0.9 UT/500ML-% IV SOLN
INTRAVENOUS | Status: AC
  Filled 2020-02-29: qty 1000

## 2020-02-29 MED ORDER — ACETAMINOPHEN 325 MG PO TABS: 650.0000 mg | ORAL_TABLET | ORAL | Status: DC | PRN

## 2020-02-29 MED ORDER — SODIUM CHLORIDE 0.9% FLUSH
3.0000 mL | Freq: Two times a day (BID) | INTRAVENOUS | Status: DC
  Administered 2020-02-29: 3 mL via INTRAVENOUS

## 2020-02-29 MED ORDER — FUROSEMIDE 40 MG PO TABS
40.0000 mg | ORAL_TABLET | Freq: Every day | ORAL | Status: DC
  Administered 2020-03-01: 40 mg via ORAL
  Filled 2020-02-29: qty 1

## 2020-02-29 MED ORDER — LABETALOL HCL 5 MG/ML IV SOLN: 10.0000 mg | INTRAVENOUS | Status: DC | PRN

## 2020-02-29 MED ORDER — SODIUM CHLORIDE 0.9 % WEIGHT BASED INFUSION
3.0000 mL/kg/h | INTRAVENOUS | Status: DC
  Administered 2020-02-29: 3 mL/kg/h via INTRAVENOUS

## 2020-02-29 MED ORDER — SODIUM CHLORIDE 0.9 % IV SOLN: INTRAVENOUS | Status: AC

## 2020-02-29 MED ORDER — LIDOCAINE HCL (PF) 1 % IJ SOLN
INTRAMUSCULAR | Status: DC | PRN
  Administered 2020-02-29: 3 mL

## 2020-02-29 MED ORDER — IODIXANOL 320 MG/ML IV SOLN
INTRAVENOUS | Status: DC | PRN
  Administered 2020-02-29: 90 mL via INTRA_ARTERIAL

## 2020-02-29 MED ORDER — ASPIRIN 81 MG PO CHEW: 81.0000 mg | CHEWABLE_TABLET | ORAL | Status: DC

## 2020-02-29 MED ORDER — SODIUM CHLORIDE 0.9 % IV SOLN: 250.0000 mL | INTRAVENOUS | Status: DC | PRN

## 2020-02-29 MED ORDER — ONDANSETRON HCL 4 MG/2ML IJ SOLN: 4.0000 mg | Freq: Four times a day (QID) | INTRAMUSCULAR | Status: DC | PRN

## 2020-02-29 MED ORDER — WARFARIN - PHARMACIST DOSING INPATIENT: Freq: Every day | Status: DC

## 2020-02-29 MED ORDER — MIDAZOLAM HCL 2 MG/2ML IJ SOLN
INTRAMUSCULAR | Status: DC | PRN
  Administered 2020-02-29: 1 mg via INTRAVENOUS

## 2020-02-29 MED ORDER — ATORVASTATIN CALCIUM 80 MG PO TABS
80.0000 mg | ORAL_TABLET | Freq: Every day | ORAL | Status: DC
  Administered 2020-03-01: 80 mg via ORAL
  Filled 2020-02-29: qty 1

## 2020-02-29 MED ORDER — SODIUM CHLORIDE 0.9 % WEIGHT BASED INFUSION: 1.0000 mL/kg/h | INTRAVENOUS | Status: DC

## 2020-02-29 MED ORDER — FENTANYL CITRATE (PF) 100 MCG/2ML IJ SOLN
INTRAMUSCULAR | Status: DC | PRN
  Administered 2020-02-29: 25 ug via INTRAVENOUS

## 2020-02-29 MED ORDER — CLOPIDOGREL BISULFATE 75 MG PO TABS: 75.0000 mg | ORAL_TABLET | Freq: Every day | ORAL | Status: DC

## 2020-02-29 SURGICAL SUPPLY — 25 items
BALLN CHOCOLATE 5.0X40X120 (BALLOONS) ×3
BALLN IN.PACT DCB 6X80 (BALLOONS) ×3
BALLN STERLING OTW 6X20X135 (BALLOONS) ×3
BALLOON CHOCOLATE 5.0X40X120 (BALLOONS) IMPLANT
BALLOON STERLING OTW 6X20X135 (BALLOONS) IMPLANT
CATH INFINITI 5FR JR4 125CM (CATHETERS) ×1 IMPLANT
CATH NAVICROSS ST .035X135CM (MICROCATHETER) ×1 IMPLANT
DCB IN.PACT 6X80 (BALLOONS) IMPLANT
DEVICE TORQUE .014-.018 (MISCELLANEOUS) IMPLANT
GUIDEWIRE ZILIENT 6G 018 (WIRE) ×1 IMPLANT
KIT ENCORE 26 ADVANTAGE (KITS) ×1 IMPLANT
KIT MICROPUNCTURE NIT STIFF (SHEATH) ×1 IMPLANT
KIT PV (KITS) ×3 IMPLANT
SHEATH DESTINATION MP 6FR 90CM (SHEATH) ×1 IMPLANT
SHEATH GLIDE SLENDER 4/5FR (SHEATH) ×1 IMPLANT
SHEATH PINNACLE 6F 10CM (SHEATH) ×1 IMPLANT
SHEATH PROBE COVER 6X72 (BAG) ×1 IMPLANT
STENT ABSOLUTE PRO 6X40X135 (Permanent Stent) ×1 IMPLANT
STOPCOCK MORSE 400PSI 3WAY (MISCELLANEOUS) ×1 IMPLANT
TORQUE DEVICE .014-.018 (MISCELLANEOUS) ×3
TRAY PV CATH (CUSTOM PROCEDURE TRAY) ×3 IMPLANT
TUBING CIL FLEX 10 FLL-RA (TUBING) ×1 IMPLANT
TUBING HIGH PRESSURE 120CM (CONNECTOR) ×1 IMPLANT
WIRE HI TORQ VERSACORE J 260CM (WIRE) ×1 IMPLANT
WIRE ROSEN-J .035X260CM (WIRE) ×1 IMPLANT

## 2020-02-29 NOTE — Progress Notes (Signed)
Called main lab spoke with tech regarding pt/inr, states she is repeating it because its critically high, wants new sample to re check.

## 2020-02-29 NOTE — Progress Notes (Signed)
Site area: left brachial arterial sheath removed by Sherlyn Lick Site Prior to Removal:  Level 0 Pressure Applied For: 30 minutes Manual:   yes Patient Status During Pull:  stable Post Pull Site:  Level 0 Post Pull Instructions Given:  yes Post Pull Pulses Present: left radal pulse palpable Dressing Applied:  Gauze and tegaderm and coband. Arm board back on Bedrest begins @ 1815 Comments:

## 2020-02-29 NOTE — Interval H&P Note (Signed)
History and Physical Interval Note:  02/29/2020 2:17 PM  [name] [name]  has presented today for surgery, with the diagnosis of PAD.  The various methods of treatment have been discussed with the patient and family. After consideration of risks, benefits and other options for treatment, the patient has consented to  Procedure(s): ABDOMINAL AORTOGRAM W/LOWER EXTREMITY (Right) as a surgical intervention.  The patient's history has been reviewed, patient examined, no change in status, stable for surgery.  I have reviewed the patient's chart and labs.  Questions were answered to the patient's satisfaction.     Quay Burow

## 2020-02-29 NOTE — Progress Notes (Signed)
ANTICOAGULATION CONSULT NOTE - Initial Consult  Pharmacy Consult for warfarin Indication: mech AVR  No Known Allergies  Patient Measurements: Height: 5\' 8"  (172.7 cm) Weight: 168 lb (76.2 kg) IBW/kg (Calculated) : 68.4  Vital Signs: Temp: 98.1 F (36.7 C) (03/25 1845) Temp Source: Oral (03/25 1845) BP: 158/59 (03/25 1845) Pulse Rate: 45 (03/25 1845)  Labs: Recent Labs    02/27/20 1522 02/29/20 1004 02/29/20 1844  LABPROT  --  14.8 14.8  INR 2.1 1.2 1.2    Estimated Creatinine Clearance: 38 mL/min (A) (by C-G formula based on SCr of 1.65 mg/dL (H)).   Medical History: Past Medical History:  Diagnosis Date  . Adenomatous colon polyp 09/1997  . Anemia   . Aortic stenosis    s/p [facility] mechanical AVR - Chronic Coumadin  . Blood transfusion    "related to ITP"  . Coronary artery disease    s/p cabg x 3 11/2003: lima-lad, seq vg to rpda and rpl  . Diverticulitis of colon   . Heart murmur   . Hyperlipidemia   . Hypertension   . ITP (idiopathic thrombocytopenic purpura)   . Peripheral arterial disease ([place])    a. history of aortobifemoral bypass grafting by Dr. Sherren Mocha early b. LE angiography 04/22/2015 patent aortobifem graft, DES to R SFA  . Peripheral vascular disease ([place])    s/p Left external Iliac Artery stenting and subsequent left femoral endarterectomy 02/2011- post op course complicated by wound infxn req I&D 03/2011  . Renal artery stenosis, native, bilateral ([facility])    a. bilateral renal artery stenosis by recent duplex ultrasound b. L renal artery stent 02/2015, R renal artery patent on angiogram  . Stroke ([place])   . TIA (transient ischemic attack) ~ 2013  . Type II diabetes mellitus [facility])    Assessment: 74 year old male s/p abdominal aortogram this afternoon. Patient with mechanical AVR, was being bridge on lovenox and holding warfarin pta. Will restart warfarin tonight with INR 1.2.   Will follow up restarting lovenox in am. Very specific instructions left  from coumadin clinic. See Melissa's note from 3/23.   Note patient very anxious to get back on therapeutic lovenox for risk of stroke, nurse called and we discussed plan to pass on to patient.   Goal of Therapy:  INR goal 2.5-3 Monitor platelets by anticoagulation protocol: Yes   Plan:  Warfarin 10mg  tonight INR in am Follow up with primary team to start lovenox in am  Erin Hearing PharmD., BCPS Clinical Pharmacist 02/29/2020 8:02 PM

## 2020-03-01 DIAGNOSIS — Z952 Presence of prosthetic heart valve: Secondary | ICD-10-CM | POA: Diagnosis not present

## 2020-03-01 DIAGNOSIS — E1151 Type 2 diabetes mellitus with diabetic peripheral angiopathy without gangrene: Secondary | ICD-10-CM | POA: Diagnosis not present

## 2020-03-01 DIAGNOSIS — I739 Peripheral vascular disease, unspecified: Secondary | ICD-10-CM

## 2020-03-01 DIAGNOSIS — I70211 Atherosclerosis of native arteries of extremities with intermittent claudication, right leg: Secondary | ICD-10-CM | POA: Diagnosis not present

## 2020-03-01 DIAGNOSIS — Z7901 Long term (current) use of anticoagulants: Secondary | ICD-10-CM | POA: Diagnosis not present

## 2020-03-01 DIAGNOSIS — Z20822 Contact with and (suspected) exposure to covid-19: Secondary | ICD-10-CM | POA: Diagnosis not present

## 2020-03-01 DIAGNOSIS — I480 Paroxysmal atrial fibrillation: Secondary | ICD-10-CM | POA: Diagnosis not present

## 2020-03-01 LAB — BASIC METABOLIC PANEL
Anion gap: 8 (ref 5–15)
BUN: 19 mg/dL (ref 8–23)
CO2: 27 mmol/L (ref 22–32)
Calcium: 8.6 mg/dL — ABNORMAL LOW (ref 8.9–10.3)
Chloride: 108 mmol/L (ref 98–111)
Creatinine, Ser: 1.14 mg/dL (ref 0.61–1.24)
GFR calc Af Amer: >60 mL/min (ref >60)
GFR calc non Af Amer: >60 mL/min (ref >60)
Glucose, Bld: 110 mg/dL — ABNORMAL HIGH (ref 70–99)
Potassium: 3.9 mmol/L (ref 3.5–5.1)
Sodium: 143 mmol/L (ref 135–145)

## 2020-03-01 LAB — CBC
HCT: 40.2 % (ref 39.0–52.0)
Hemoglobin: 13 g/dL (ref 13.0–17.0)
MCH: 29.7 pg (ref 26.0–34.0)
MCHC: 32.3 g/dL (ref 30.0–36.0)
MCV: 92 fL (ref 80.0–100.0)
Platelets: 194 10*3/uL (ref 150–400)
RBC: 4.37 MIL/uL (ref 4.22–5.81)
RDW: 19.5 % — ABNORMAL HIGH (ref 11.5–15.5)
WBC: 8.3 10*3/uL (ref 4.0–10.5)
nRBC: 0 % (ref 0.0–0.2)

## 2020-03-01 LAB — PROTIME-INR
INR: 1.1 (ref 0.8–1.2)
Prothrombin Time: 13.8 seconds (ref 11.4–15.2)

## 2020-03-01 MED ORDER — ENOXAPARIN SODIUM 120 MG/0.8ML ~~LOC~~ SOLN
120.0000 mg | SUBCUTANEOUS | Status: DC
  Administered 2020-03-01: 120 mg via SUBCUTANEOUS
  Filled 2020-03-01: qty 0.8

## 2020-03-01 MED ORDER — WARFARIN SODIUM 5 MG PO TABS: 5.0000 mg | ORAL_TABLET | Freq: Once | ORAL | Status: DC

## 2020-03-01 NOTE — Discharge Summary (Addendum)
Discharge Summary    Patient ID: [name] [name] MRN: [id number]; DOB: 12-03-1946  Admit date: 02/29/2020 Discharge date: 03/01/2020  Primary Care Provider: Shon Baton, MD  Primary Cardiologist: Jenkins Rouge, MD  Peripheral Vascular:  Quay Burow Heart Failure: Dr. Aundra Dubin  Discharge Diagnoses    Active Problems:   PVD (peripheral vascular disease) with claudication [facility])   Claudication in peripheral vascular disease ([place]) Mechanical AVR on Coumadin TIA Recurrent GI bleed Diabetes mellitus Hyperlipidemia CAD    Diagnostic Studies/Procedures     PV Angiogram/Intervention 03/01/20 Procedures Performed:               1.  Ultrasound-guided left brachial artery access               2.  Angiography right SFA               3.  Chocolate balloon angioplasty followed by drug-coated balloon angioplasty proximal right SFA for "in-stent restenosis               4.  Restenting of proximal right SFA with a Abbott absolute Pro nitinol self-expanding stent.   PROCEDURE DESCRIPTION:    The patient was brought to the second floor [facility] cath lab in the the postabsorptive state. He was premedicated with IV Versed and fentanyl. His left antecubital fossa was prepped and shaved in usual sterile fashion. Xylocaine 1% was used for local anesthesia. A 5 French sheath was inserted into the left brachial artery using standard Seldinger technique.  Ultrasound guidance was used to identify the vessel and guide access.  A digital image was captured and placed the patient's chart.  The patient received 3000 as of heparin via the SideArm sheath.  Isovue dye was used for the entirety of the case.  Retrograde aortic pressures monitored during the case.  I used an 035 versa core wire along with a long 5 French right Judkins catheter to access the right limb of the aortobifemoral bypass graft.  I then exchanged for a long 035 Rosen wire and placed a 6 French 90 cm long destination sheath over  the Rosen wire into the right limb of the aortobifemoral bypass graft.     Angiographic Data:    1: Short segment occlusion proximal right SFA with "in-stent restenosis".   IMPRESSION: Mr. Even has a short segment occlusion of the proximal right SFA with in-stent restenosis.  We will proceed with percutaneous intervention.   Procedure Description: Patient received a total of 11,000 as of heparin with an ACT of 235 and a peak ACT of 300.  Using a 035 straight Nava cross endhole catheter along with a 6 g 018 300 cm long Zilient wire I was able to cross the CTO with little difficulty.  I then performed PTA with a 5 mm x 4 cm chocolate balloon and PTA with a 6 mm x 8 cm impact Admiral drug-coated balloon.  There was a small area of recoil and residual in-stent restenosis and therefore I elected to restent this with a 6 mm x 4 cm long Abbott absolute Pro nitinol self-expanding stent postdilated with a 6 mm x 2 cm balloon resulting reduction of a short segment occlusion to 0% residual.  The patient tolerated the procedure well.  The long sheath was removed and exchanged over an 035 wire for a short 6 [place] sheath which was then secured in place.   Final Impression: Successful chocolate balloon angioplasty, DC B and restenting using a 6 mm  x 40 mm long Abbott absolute Pro nitinol self-expanding stent resulting in reduction of a short segment CTO in-stent restenosis to 0% residual.  The patient tolerated procedure well.  The sheath will be removed in the left brachial artery once the ACT falls below 170 and pressure held.  Patient was gently hydrated per night.  He is on aspirin and Plavix.  Coumadin will be restarted in the morning.  We will obtain lower extremity arterial Doppler studies no [place] office next week and I will see him back 2 to 3 weeks thereafter.   History of Present Illness     [name] [name] has past medical history of essential hypertension, CAD status post CABG, aortic valve  disorder (mechanical AVR on Coumadin) TIA, PVD, renal artery stenosis, DM, HLD, recurrent GI bleed and requires Lovenox bridging off heparin presented for peripheral vascular intervention.   He had aortofemoral bypass in 2016 with a follow-up iliac stenting and femoral endarterectomy.  In 2017 he underwent subsequent ostial and mid right SFA intervention.  He had renal artery stenting in 2016 with repeat intervention on the left for 75% in-stent restenosis, noted progression of disease on the right renal artery showing 60% stenosis on duplex (07/2019).  His echocardiogram 09/2019 showed a normal EF 60-65%, and normal AVR function, chronic diastolic HF.   He was admitted 09/2019 with chest pain.  He underwent cardiac catheterization 09/18/2019 which showed 99% left main stenosis and patent LIMA to occluded LAD and a patent SVG to the PDA and PLA.  His left main stenosis jeopardized a moderately large ramus and a high first marginal and nondominant CX which had 90% mid AV groove CX stenosis.  Medical management was recommended at that time.   He was readmitted 09/2019 with CHF and chest pain.  His hospital course was complicated by atrial flutter/low output heart failure. S/p successful orbital arthrectomy, PTCA, and stenting of the protected left main with DES.  LIMA-LAD patent.  SVG-PDA patent.  His echocardiogram during that admission showed EF 30-35%, mechanical AoV okay.  A TEE 10/16/2019 showed improved EF 40-45%.  He was placed on milrinone initially with a low carboxyhemoglobin and later weaned off.  He was then cardioverted from atrial flutter back to NSR.  02/02/20- LE arterial duplex with high velocity 4.15 msec at proximal right SFA stent >>Seen by Dr. Gwenlyn Found for follow up>>He does have a high-grade proximal right SFA in-stent restenosis just distal to his right limb of his aortobifemoral bypass graft anastomosis. He is symptomatic with lifestyle limiting claudication.   He was bridged with  lovenox  for PV intervention.    Hospital Course     Consultants: None  Angiography showed short segment occlusion proximal right SFA with "in-stent restenosis". S/p Successful chocolate balloon angioplasty, DC B and restenting using a 6 mm x 40 mm long Abbott absolute Pro nitinol self-expanding stent resulting in reduction of a short segment CTO in-stent restenosis to 0% residual.  The patient tolerated procedure well.    Hydrated overnight.  No complication.  Early morning around 6-8am patient had bradycardia in 30s.  He was sleeping at that time.  Heart rate improved gradually to 50s.  He is asymptomatic at home and heart rate in 50 to 60s.  Will continue home dose of amiodarone, digoxin and coreg.  Reviewed alarming symptoms. Continue ASA and Plavix. INR 1.1.   He will be bridged with Lovenox.  Follow-up appointment in Coumadin clinic has been arranged.  Likely discontinuation of aspirin in  1 month.  Reviewed signs of bleeding complication.  Did the patient have an acute coronary syndrome (MI, NSTEMI, STEMI, etc) this admission?:  No                               Did the patient have a percutaneous coronary intervention (stent / angioplasty)?:  No.     Discharge Vitals Blood pressure (!) 116/40, pulse 76, temperature 98.4 F (36.9 C), temperature source Oral, resp. rate 18, height 5\' 8"  (1.727 m), weight 81.7 kg, SpO2 95 %.  Filed Weights   02/29/20 1018 03/01/20 0500  Weight: 76.2 kg 81.7 kg   Physical Exam  Constitutional: He is oriented to person, place, and time and well-developed, well-nourished, and in no distress.  HENT:  Head: Normocephalic and atraumatic.  Eyes: Pupils are equal, round, and reactive to light. Conjunctivae and EOM are normal.  Cardiovascular: Normal rate and regular rhythm.  Crisp mechanical valve.  Left brachial cath site without hematoma or bruising.  Pulmonary/Chest: Effort normal and breath sounds normal.  Abdominal: Soft. Bowel sounds are normal.    Musculoskeletal:        General: Normal range of motion.     Cervical back: Normal range of motion and neck supple.  Neurological: He is alert and oriented to person, place, and time.  Skin: Skin is warm and dry.  Psychiatric: Affect normal.   Labs & Radiologic Studies    CBC Recent Labs    03/01/20 0350  WBC 8.3  HGB 13.0  HCT 40.2  MCV 92.0  PLT 341   Basic Metabolic Panel Recent Labs    03/01/20 0350  NA 143  K 3.9  CL 108  CO2 27  GLUCOSE 110*  BUN 19  CREATININE 1.14  CALCIUM 8.6*  _____________  PERIPHERAL VASCULAR CATHETERIZATION  Result Date: 02/29/2020  [id number] LOCATION:  FACILITY: [facility] PHYSICIAN: Quay Burow, M.D. 08-10-1946 DATE OF PROCEDURE:  02/29/2020 DATE OF DISCHARGE: PV Angiogram/Intervention History obtained from chart review.Jamorris Ndiaye is a 74 y.o.   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 I last saw him in the office      10/30/2019. He retired a little over 2 years ago having worked there for 25 years. His primary care physician is Dr. Virgina Jock. He was referred by Dr. Johnsie Cancel for peripheral vascular evaluation because of resistant hypertension with duplex evidence of bilateral renal artery stenosis as well as new-onset right calf claudication with recent Dopplers that showed a lesion in his mid right SFA. He does have a history of remote tobacco abuse as well as treated hypertension, hyperlipidemia and diabetes. He has had coronary bypass grafting and St. Jude aVR performed by Dr. Cyndia Bent December 2004 on Coumadin anticoagulation. He has also had aortobifemoral bypass grafting, left external iliac stenting and left common femoral endarterectomy performed by Dr. Donnetta Hutching. I performed left renal artery stenting 02/21/15 with an excellent angiographic and clinical result. His blood pressure has been under better control.  I then perform staged right SFA directional atherectomy 04/22/15 removing a high-grade eccentric calcified mid to distal right SFA plaque. He did have progression of disease at the origin of his right SFA. His claudication markedly improved after his procedure however since I saw on the year ago he's had some recurrent claudication. His blood pressures have been under  better control since his renal artery intervention. Recent lower extremity Doppler studies performed 07/15/16 revealed a right ABI 0.34 with a new high-frequency signal in the origin of his right SFA.  I performed angiography 08/24/16 revealing a 95% proximal right SFA stenosis, 80% mid right SFA stenosis and 80% calcified mid left SFA stenosis. In addition, he had 75% "in-stent restenosis" within the previously placed left renal artery stent. Intervention will be somewhat challenging given the proximity of the proximal right SFA stenosis . He underwent peripheral angiography intervention by myself 10/01/16. Right antegrade approach. I was able to stick the hood of his aortobifem and intrarenal in the proximal right SFA with drug-eluting [place] plasty and stents. I also performed drug-eluting plasty in the mid right SFA. His symptoms resolved after that and he is back playing golf. He was discharged on the following day. His Dopplers have normalized. Since I saw him in the office a year ago he's remained stable. He denies claudication. His most recent Dopplers performed in July of last year revealed widely patent proximal right SFA stent and mid right SFA.  Since I saw him several months ago he was recently admitted with a stroke. His INR was subtherapeutic for unclear reasons. He was re-anticoagulated. An MRI showed a small left frontal CVA. He did have some facial drooping and numbness which is slowly improving. He still denies claudication.   Since I saw him in the office 07/12/2018 he was admitted with chest pain and underwent diagnostic coronary  angiography by myself on 07/18/2018 revealing high-grade calcified left main stenosis, occluded LAD proximally with a patent LIMA and high-grade mid AV groove circumflex.  He had an occluded dominant RCA at its origin with a patent sequential vein to the PDA and PLA.  At that time I elected to treat him medically because of the complexity of the revascularization procedure.  Unfortunately, he was readmitted with heart failure and non-STEMI and underwent orbital atherectomy, PCI and drug-eluting stenting by Dr. Burt Knack using a 4 mm x 15 mm long resolute Onyx drug-eluting stent.  His mid AV groove circumflex was not intervened on.  His EF increased as a result from 30-35 to 40-45.  In addition, he he underwent successful DC cardioversion from A. fib to sinus rhythm after TEE showed no evidence of intracardiac thrombi.  He was discharged home on "triple therapy" including low-dose aspirin, Plavix and Coumadin anticoagulation for his Saint Jude mechanical valve with plan to drop his aspirin after 30 days.  At the time of angiography in October I did note 80% "in-stent restenosis within his proximal right SFA stent and he does admit to claudication.   I performed cardiac catheterization on him 09/18/2019 revealing calcified severe left main disease with a patent sequential vein to the PDA and PLA and LIMA to the LAD.  His left main was compromising his circumflex circulation.  He did feel medical therapy and ultimately underwent orbital atherectomy and left main stenting by Dr. Burt Knack 10/10/2019 resulting in marked improvement in his symptoms.  His EF actually did improve from 30/35% up to 40/45% by 2D echo.  He has had progressive lifestyle limiting claudication in his right leg over the last 6 months with recent Doppler study performed 02/02/2020 revealing a high-frequency signal in his proximal right SFA stent.  I did demonstrate this by angiography at the time of cath as well.  He presents now for outpatient right SFA  intervention via the left brachial approach.  His INR was 1.2  today. Pre Procedure Diagnosis: Peripheral arterial disease Post Procedure Diagnosis: Peripheral arterial disease Operators: Dr. Quay Burow Procedures Performed:  1.  Ultrasound-guided left brachial artery access  2.  Angiography right SFA  3.  Chocolate balloon angioplasty followed by drug-coated balloon angioplasty proximal right SFA for "in-stent restenosis  4.  Restenting of proximal right SFA with a Abbott absolute Pro nitinol self-expanding stent. PROCEDURE DESCRIPTION: The patient was brought to the second floor [facility] cath lab in the the postabsorptive state. He was premedicated with IV Versed and fentanyl. His left antecubital fossa was prepped and shaved in usual sterile fashion. Xylocaine 1% was used for local anesthesia. A 5 French sheath was inserted into the left brachial artery using standard Seldinger technique.  Ultrasound guidance was used to identify the vessel and guide access.  A digital image was captured and placed the patient's chart.  The patient received 3000 as of heparin via the SideArm sheath.  Isovue dye was used for the entirety of the case.  Retrograde aortic pressures monitored during the case.  I used an 035 versa core wire along with a long 5 French right Judkins catheter to access the right limb of the aortobifemoral bypass graft.  I then exchanged for a long 035 Rosen wire and placed a 6 French 90 cm long destination sheath over the Rosen wire into the right limb of the aortobifemoral bypass graft.  Angiographic Data: 1: Short segment occlusion proximal right SFA with "in-stent restenosis".   Mr. Lienhard has a short segment occlusion of the proximal right SFA with in-stent restenosis.  We will proceed with percutaneous intervention. Procedure Description: Patient received a total of 11,000 as of heparin with an ACT of 235 and a peak ACT of 300.  Using a 035 straight Nava cross endhole catheter along  with a 6 g 018 300 cm long Zilient wire I was able to cross the CTO with little difficulty.  I then performed PTA with a 5 mm x 4 cm chocolate balloon and PTA with a 6 mm x 8 cm impact Admiral drug-coated balloon.  There was a small area of recoil and residual in-stent restenosis and therefore I elected to restent this with a 6 mm x 4 cm long Abbott absolute Pro nitinol self-expanding stent postdilated with a 6 mm x 2 cm balloon resulting reduction of a short segment occlusion to 0% residual.  The patient tolerated the procedure well.  The long sheath was removed and exchanged over an 035 wire for a short 6 [place] sheath which was then secured in place. Final Impression: Successful chocolate balloon angioplasty, DC B and restenting using a 6 mm x 40 mm long Abbott absolute Pro nitinol self-expanding stent resulting in reduction of a short segment CTO in-stent restenosis to 0% residual.  The patient tolerated procedure well.  The sheath will be removed in the left brachial artery once the ACT falls below 170 and pressure held.  Patient was gently hydrated per night.  He is on aspirin and Plavix.  Coumadin will be restarted in the morning.  We will obtain lower extremity arterial Doppler studies no [place] office next week and I will see him back 2 to 3 weeks thereafter. Quay Burow. MD, [facility] 02/29/2020 4:04 PM   VAS [place] ABI WITH/WO TBI  Result Date: 02/02/2020 LOWER EXTREMITY DOPPLER STUDY Indications: Claudication, and peripheral artery disease. Patient reports he is              now getting  right calf claudication symptoms after walking about 50              yards. He is limited by his claudication, wants to get back to              working out and being active. History of aorta-bi-femoral bypass              graft and multiple right SFA interventions. High Risk Factors: Hypertension, hyperlipidemia, Diabetes, current smoker, prior                    MI, coronary artery disease.  Vascular Interventions: S/P  angioplasty and stenting of right SFA in 10/17,                         history of right SFA atherectomy in 5/16 and                         aortabifemoral bypass graft and femoral endarterectomy                         in 2013. Comparison Study: Previous ABI's 07/12/19 were non-compressible bilaterally. Right                   TBI was 0.45 and left TBI was 0.51 Performing Technologist: Mariane Masters RVT  Examination Guidelines: A complete evaluation includes at minimum, Doppler waveform signals and systolic blood pressure reading at the level of bilateral brachial, anterior tibial, and posterior tibial arteries, when vessel segments are accessible. Bilateral testing is considered an integral part of a complete examination. Photoelectric Plethysmograph (PPG) waveforms and toe systolic pressure readings are included as required and additional duplex testing as needed. Limited examinations for reoccurring indications may be performed as noted.  ABI Findings: +---------+------------------+-----+-------------------+--------+ Right    Rt Pressure (mmHg)IndexWaveform           Comment  +---------+------------------+-----+-------------------+--------+ Brachial 160                                                +---------+------------------+-----+-------------------+--------+ ATA      254               1.59 monophasic                  +---------+------------------+-----+-------------------+--------+ PTA      254               1.59 dampened monophasic         +---------+------------------+-----+-------------------+--------+ PERO     254               1.59 dampened monophasic         +---------+------------------+-----+-------------------+--------+ Great Toe35                0.22 Abnormal                    +---------+------------------+-----+-------------------+--------+ +---------+-----------------+-----+-----------+--------------------------------+ Left     Lt Pressure       IndexWaveform   Comment                                   (mmHg)                                                            +---------+-----------------+-----+-----------+--------------------------------+  Brachial 160                                                               +---------+-----------------+-----+-----------+--------------------------------+ ATA      254              1.59 multiphasicsounded better than able to                                                record                           +---------+-----------------+-----+-----------+--------------------------------+ PTA      254              1.59 monophasic sounded better than able to                                                record                           +---------+-----------------+-----+-----------+--------------------------------+ PERO     254              1.59 biphasic                                    +---------+-----------------+-----+-----------+--------------------------------+ Great Toe38               0.24 Abnormal                                    +---------+-----------------+-----+-----------+--------------------------------+ +-------+-----------+-----------+------------+------------+ ABI/TBIToday's ABIToday's TBIPrevious ABIPrevious TBI +-------+-----------+-----------+------------+------------+ Right  [place]         0.22       [place]          0.45         +-------+-----------+-----------+------------+------------+ Left   [place]         0.24       [place]          0.51         +-------+-----------+-----------+------------+------------+ Arterial wall calcification precludes accurate ankle pressures and ABIs. Bilateral ABIs appear essentially unchanged compared to prior study on 07/12/19, though the right tibial waveforms appear to have worsened. Bilateral TBIs appear decreased compared to prior study on 07/12/19.  Summary: Right: Resting right ankle-brachial index indicates  noncompressible right lower extremity arteries. The right toe-brachial index is abnormal. Left: Resting left ankle-brachial index indicates noncompressible left lower extremity arteries. The left toe-brachial index is abnormal.  *See table(s) above for measurements and observations. See arterial duplex report.  Vascular consult recommended. Electronically signed by Kathlyn Sacramento MD on 02/02/2020 at 12:52:33 PM.    Final    VAS [place] LOWER EXTREMITY ARTERIAL DUPLEX  Result Date: 02/02/2020 LOWER EXTREMITY ARTERIAL DUPLEX STUDY Indications: Claudication, and peripheral artery disease. Patient reports he is  now getting right calf claudication symptoms after walking about 50              yards. He is limited by his claudication, wants to get back to              working out and being active. History of aorta-bi-femoral bypass              graft and multiple right SFA interventions. High Risk Factors: Hypertension, hyperlipidemia, Diabetes, current smoker, prior                    MI, coronary artery disease.  Vascular Interventions: S/P angioplasty and stenting of right SFA in 10/17,                         history of right SFA atherectomy in 5/16 and                         aortabifemoral bypass graft and femoral endarterectomy                         in 2013. Current ABI:            ABI's are non-compressible bilaterally, right TBI is                         0.22 and left TBI is 0.24 Limitations: Technically challenging examination due to heavily calcified              plaque, overlying bowel gas. Comparison Study: Prior right arterial duplex showed R SFA prox 50-74% stenosis                   and 50-99% stenosis of the proximal right SFA stent, PSV of                   415 cm/sec. Performing Technologist: Mariane Masters RVT  Examination Guidelines: A complete evaluation includes B-mode imaging, spectral Doppler, color Doppler, and power Doppler as needed of all accessible portions of each vessel.  Bilateral testing is considered an integral part of a complete examination. Limited examinations for reoccurring indications may be performed as noted. Aorta: Attempted to visualize the aorta however patient was not NPO and dense overlying bowel gas completely obscured visualization. IVC prox appears patent.  +----------+--------+-----+---------------+----------+---------------------+ RIGHT     PSV cm/sRatioStenosis       Waveform  Comments              +----------+--------+-----+---------------+----------+---------------------+ CFA Prox  85                          turbulent distal anast. of ABFG +----------+--------+-----+---------------+----------+---------------------+ DFA       140                         biphasic                        +----------+--------+-----+---------------+----------+---------------------+ SFA Prox  564          75-99% stenosisstenotic  stent origin area     +----------+--------+-----+---------------+----------+---------------------+ POP Prox  33                          monophasic                      +----------+--------+-----+---------------+----------+---------------------+  POP Distal23                          monophasic                      +----------+--------+-----+---------------+----------+---------------------+ TP Trunk  19                          monophasic                      +----------+--------+-----+---------------+----------+---------------------+  +--------------+---+--------------+----------+--------------------------------+                                                                           +--------------+---+--------------+----------+--------------------------------+ Prox to Stent 52750-99%        stenotic  stent origin vs. just prox to                     stenosis                stent                            +--------------+---+--------------+----------+--------------------------------+  Proximal Stent91               turbulent                                  +--------------+---+--------------+----------+--------------------------------+ Mid Stent     16               monophasiclow flow                         +--------------+---+--------------+----------+--------------------------------+ Distal Stent  29                         low flow                         +--------------+---+--------------+----------+--------------------------------+ Distal to     33               monophasic                                 Stent                                                                     +--------------+---+--------------+----------+--------------------------------+ Stent struts not well-visualized due to significant medial calcifications of the vessel walls.  Right Graft #1: +---------------------+--------+--------+--------+--------+ Aorto-bifemoral RIGHTPSV cm/sStenosisWaveformComments +---------------------+--------+--------+--------+--------+ Inflow  NV       +---------------------+--------+--------+--------+--------+ Prox Anastomosis                             NV       +---------------------+--------+--------+--------+--------+ Proximal Graft                               NV       +---------------------+--------+--------+--------+--------+ Mid Graft            97              biphasic         +---------------------+--------+--------+--------+--------+ Distal Graft         90              biphasic         +---------------------+--------+--------+--------+--------+ Distal Anastomosis   183             biphasic         +---------------------+--------+--------+--------+--------+ Outflow                                               +---------------------+--------+--------+--------+--------+ Visualized segments of the Aorta-bifemoral RIGHT bypass graft are patent without significant  stenosis.  +----------+--------+-----+--------+--------+--------+ LEFT      PSV cm/sRatioStenosisWaveformComments +----------+--------+-----+--------+--------+--------+ DFA       140                  biphasic         +----------+--------+-----+--------+--------+--------+ SFA Prox  99                   biphasic         +----------+--------+-----+--------+--------+--------+ SFA Mid   113                  biphasic         +----------+--------+-----+--------+--------+--------+ SFA Distal88                   biphasic         +----------+--------+-----+--------+--------+--------+ POP Prox  81                   biphasicplaque   +----------+--------+-----+--------+--------+--------+ POP Distal72                   biphasic         +----------+--------+-----+--------+--------+--------+ TP Trunk  90                   biphasic         +----------+--------+-----+--------+--------+--------+ Medial calcification noted throughout the left lower extremity, without evidence of focal stenosis.  Left Graft #1: +--------------------+--------+---------------+---------+----------------+ Aorto-bifemoral LEFTPSV cm/sStenosis       Waveform Comments         +--------------------+--------+---------------+---------+----------------+ Inflow                                              NV               +--------------------+--------+---------------+---------+----------------+ Proximal Anastomosis  NV               +--------------------+--------+---------------+---------+----------------+ Proximal Graft                                      NV               +--------------------+--------+---------------+---------+----------------+ Mid Graft           142                    biphasic                  +--------------------+--------+---------------+---------+----------------+ Distal Graft        227     50-70% stenosisbiphasic  low-end of range +--------------------+--------+---------------+---------+----------------+ Distal Anastomosis  57                     triphasic                 +--------------------+--------+---------------+---------+----------------+ Outflow             99                     biphasic                  +--------------------+--------+---------------+---------+----------------+ Visualized segments of the Aorta-bifemoral bypass graft LEFT are patent with >50% stenosis of the distal graft, low-end of range.  Summary: Right: 75-99% stenosis (near occlusion), of the origin/proximal SFA stent area. Low-flow noted throughout the remaining stent segments. Left: Medial calcifications noted throughout the left lower extremity, without evidence of focal stenosis. Visualized segments of the Aorta-bifemoral bypass graft are patent, with essentially stable >50% stenosis of the left distal graft, low-end of range.  See table(s) above for measurements and observations. See ABI report. Vascular consult recommended. Electronically signed by Kathlyn Sacramento MD on 02/02/2020 at 12:55:20 PM.    Final    Disposition   Pt is being discharged home today in good condition.  Follow-up Plans & Appointments    Follow-up Information     [facility]. Go on 03/06/2020.   Specialty: Cardiology Why: @3 :15pm for coumadin check  Contact information: [address] [place]. Go on 03/07/2020.   Specialty: Cardiology Why: @9am  for PV study Contact information: [address] [place] [phone]        Lorretta Harp, MD Follow up on 03/15/2020.   Specialties: Cardiology, Radiology Why: @10am  for hospital follow up Contact information: [address] [place] [address] [phone]           Discharge Instructions     Diet - low sodium  heart healthy   Complete by: As directed    Discharge instructions   Complete by: As directed    No driving for 48 hours. No lifting over 5 lbs for 1 week. No sexual activity for 1 week. Keep procedure site clean & dry. If you notice increased pain, swelling, bleeding or pus, call/return!  You may shower, but no soaking baths/hot tubs/pools for 1 week.   Increase activity slowly   Complete by: As directed        Discharge Medications   Allergies as of 03/01/2020   No Known Allergies  Medication List     TAKE these medications    amiodarone 100 MG tablet Commonly known as: PACERONE Take 1 tablet (100 mg total) by mouth daily.   amoxicillin 500 MG capsule Commonly known as: AMOXIL Take 2,000 mg by mouth See admin instructions. Take 2000 mg by mouth 30-60 minutes before appointment   aspirin EC 81 MG tablet Take 81 mg by mouth daily. Pre- procedure   atorvastatin 80 MG tablet Commonly known as: LIPITOR Take 80 mg by mouth daily.   canagliflozin 300 MG Tabs tablet Commonly known as: INVOKANA Take 300 mg by mouth daily.   carvedilol 3.125 MG tablet Commonly known as: COREG Take 1 tablet (3.125 mg total) by mouth 2 (two) times daily.   clopidogrel 75 MG tablet Commonly known as: PLAVIX Take 1 tablet (75 mg total) by mouth daily.   digoxin 0.125 MG tablet Commonly known as: LANOXIN TAKE 1 TABLET BY MOUTH DAILY   enoxaparin 120 MG/0.8ML injection Commonly known as: LOVENOX Inject 0.8 mLs (120 mg total) into the skin daily.   furosemide 40 MG tablet Commonly known as: LASIX Take 40 mg by mouth daily.   glyBURIDE 2.5 MG tablet Commonly known as: DIABETA Take 2.5 mg by mouth 2 (two) times daily with a meal.   nitroGLYCERIN 0.4 MG SL tablet Commonly known as: NITROSTAT Place 1 tablet (0.4 mg total) under the tongue every 5 (five) minutes x 3 doses as needed for chest pain.   pantoprazole 40 MG tablet Commonly known as: PROTONIX Take 40 mg by mouth  daily.   potassium chloride SA 20 MEQ tablet Commonly known as: KLOR-CON Take 1 tablet (20 mEq total) by mouth daily.   Repatha SureClick 998 MG/ML Soaj Generic drug: Evolocumab Inject 140 mg into the skin every 14 (fourteen) days.   sacubitril-valsartan 24-26 MG Commonly known as: ENTRESTO Take 1 tablet by mouth 2 (two) times daily.   spironolactone 25 MG tablet Commonly known as: ALDACTONE Take 1 tablet (25 mg total) by mouth daily.   warfarin 5 MG tablet Commonly known as: COUMADIN Take as directed. If you are unsure how to take this medication, talk to your nurse or doctor. Original instructions: Take 5-10 mg by mouth See admin instructions. Take 10 mg by mouth on Sunday and Thursday, all remaining days take 5 mg daily           Outstanding Labs/Studies   INR on 03/06/20.  Duration of Discharge Encounter   Greater than 30 minutes including physician time.  SignedLeanor Kail, PA 03/01/2020, 9:56 AM  I have personally seen and examined this patient. I agree with the assessment and plan as outlined above.  He is doing well this am post PV procedure. Will continue ASA, Plavix and coumadin. Will let Dr. Gwenlyn Found decide on duration of ASA.  Discharge today.   Lauree Chandler 03/01/2020 10:00 AM

## 2020-03-01 NOTE — Discharge Instructions (Signed)

## 2020-03-01 NOTE — Progress Notes (Signed)
ANTICOAGULATION CONSULT NOTE   Pharmacy Consult for warfarin Indication: mech AVR  No Known Allergies  Patient Measurements: Height: 5\' 8"  (172.7 cm) Weight: 180 lb 1.9 oz (81.7 kg) IBW/kg (Calculated) : 68.4  Vital Signs: Temp: 98.4 F (36.9 C) (03/26 0500) Temp Source: Oral (03/26 0500) BP: 116/40 (03/26 0800) Pulse Rate: 76 (03/26 0800)  Labs: Recent Labs    02/29/20 1004 02/29/20 1844 03/01/20 0350  HGB  --   --  13.0  HCT  --   --  40.2  PLT  --   --  194  LABPROT 14.8 14.8 13.8  INR 1.2 1.2 1.1  CREATININE  --   --  1.14    Estimated Creatinine Clearance: 55 mL/min (by C-G formula based on SCr of 1.14 mg/dL).   Medical History: Past Medical History:  Diagnosis Date  . Adenomatous colon polyp 09/1997  . Anemia   . Aortic stenosis    s/p [facility] mechanical AVR - Chronic Coumadin  . Blood transfusion    "related to ITP"  . Coronary artery disease    s/p cabg x 3 11/2003: lima-lad, seq vg to rpda and rpl  . Diverticulitis of colon   . Heart murmur   . Hyperlipidemia   . Hypertension   . ITP (idiopathic thrombocytopenic purpura)   . Peripheral arterial disease ([place])    a. history of aortobifemoral bypass grafting by Dr. Sherren Mocha early b. LE angiography 04/22/2015 patent aortobifem graft, DES to R SFA  . Peripheral vascular disease ([facility] [place])    s/p Left external Iliac Artery stenting and subsequent left femoral endarterectomy 02/2011- post op course complicated by wound infxn req I&D 03/2011  . Renal artery stenosis, native, bilateral ([facility])    a. bilateral renal artery stenosis by recent duplex ultrasound b. L renal artery stent 02/2015, R renal artery patent on angiogram  . Stroke ([place])   . TIA (transient ischemic attack) ~ 2013  . Type II diabetes mellitus [facility])    Assessment: 74 year old male s/p abdominal aortogram this afternoon. Patient with mechanical AVR, was being bridge on lovenox and holding warfarin pta.   INR was 1.1. Hgb 13, pl 194. No s/sx of  bleeding. S/p PVD procedure yesterday. Restarted on warfarin 3/25.   Goal of Therapy:  INR goal 2.5-3 Monitor platelets by anticoagulation protocol: Yes   Plan:  Warfarin 5 mg tonight - will discharge on PTA regimen INR in am Has 120 mg enoxaparin syringe ordered for discharge - will start today and bridge until follow up INR appointment   Antonietta Jewel, PharmD, [place] Pharmacist  Phone: [phone]  Please check AMION for all [place] phone numbers After 10:00 PM, call [place] [phone] 03/01/2020 9:23 AM

## 2020-03-01 NOTE — Plan of Care (Signed)
  Problem: Education: Goal: Knowledge of General Education information will improve Description: Including pain rating scale, medication(s)/side effects and non-pharmacologic comfort measures Outcome: Adequate for Discharge   Problem: Clinical Measurements: Goal: Ability to maintain clinical measurements within normal limits will improve Outcome: Adequate for Discharge Goal: Will remain free from infection Outcome: Adequate for Discharge Goal: Diagnostic test results will improve Outcome: Adequate for Discharge Goal: Respiratory complications will improve Outcome: Adequate for Discharge Goal: Cardiovascular complication will be avoided Outcome: Adequate for Discharge   Problem: Activity: Goal: Risk for activity intolerance will decrease Outcome: Adequate for Discharge   Problem: Nutrition: Goal: Adequate nutrition will be maintained Outcome: Adequate for Discharge   Problem: Coping: Goal: Level of anxiety will decrease Outcome: Adequate for Discharge   Problem: Elimination: Goal: Will not experience complications related to bowel motility Outcome: Adequate for Discharge Goal: Will not experience complications related to urinary retention Outcome: Adequate for Discharge   Problem: Pain Managment: Goal: General experience of comfort will improve Outcome: Adequate for Discharge   Problem: Safety: Goal: Ability to remain free from injury will improve Outcome: Adequate for Discharge   Problem: Skin Integrity: Goal: Risk for impaired skin integrity will decrease Outcome: Adequate for Discharge

## 2020-03-01 NOTE — Progress Notes (Signed)
Night shift reported that his HR dropped to 28 with some pauses while in SR 1st degree HB.  Idolina Primer, RN

## 2020-03-04 ENCOUNTER — Telehealth (HOSPITAL_COMMUNITY): Payer: Self-pay | Admitting: *Deleted

## 2020-03-04 NOTE — Telephone Encounter (Signed)
Pt left VM stating his weight is up a few lbs, he has swelling in his feet, and has more shortness of breath with exertion. No other complaints at this time. Pt is taking all medications as prescribed.  Routed to [employer], [place] for advice

## 2020-03-05 ENCOUNTER — Emergency Department (HOSPITAL_COMMUNITY)
Admission: EM | Admit: 2020-03-05 | Discharge: 2020-03-05 | Disposition: A | Discharge status: Home / Self Care | Payer: Medicare Other | Attending: Emergency Medicine | Admitting: Emergency Medicine

## 2020-03-05 ENCOUNTER — Emergency Department (HOSPITAL_COMMUNITY): Payer: Medicare Other

## 2020-03-05 ENCOUNTER — Other Ambulatory Visit: Payer: Self-pay

## 2020-03-05 ENCOUNTER — Encounter (HOSPITAL_COMMUNITY): Payer: Self-pay | Admitting: Emergency Medicine

## 2020-03-05 DIAGNOSIS — Z7982 Long term (current) use of aspirin: Secondary | ICD-10-CM | POA: Insufficient documentation

## 2020-03-05 DIAGNOSIS — E119 Type 2 diabetes mellitus without complications: Secondary | ICD-10-CM | POA: Insufficient documentation

## 2020-03-05 DIAGNOSIS — Z7984 Long term (current) use of oral hypoglycemic drugs: Secondary | ICD-10-CM | POA: Diagnosis not present

## 2020-03-05 DIAGNOSIS — Z951 Presence of aortocoronary bypass graft: Secondary | ICD-10-CM | POA: Insufficient documentation

## 2020-03-05 DIAGNOSIS — Z720 Tobacco use: Secondary | ICD-10-CM | POA: Insufficient documentation

## 2020-03-05 DIAGNOSIS — Z79899 Other long term (current) drug therapy: Secondary | ICD-10-CM | POA: Insufficient documentation

## 2020-03-05 DIAGNOSIS — I5043 Acute on chronic combined systolic (congestive) and diastolic (congestive) heart failure: Secondary | ICD-10-CM | POA: Insufficient documentation

## 2020-03-05 DIAGNOSIS — I10 Essential (primary) hypertension: Secondary | ICD-10-CM | POA: Insufficient documentation

## 2020-03-05 DIAGNOSIS — R0789 Other chest pain: Secondary | ICD-10-CM | POA: Diagnosis present

## 2020-03-05 DIAGNOSIS — I2510 Atherosclerotic heart disease of native coronary artery without angina pectoris: Secondary | ICD-10-CM | POA: Diagnosis not present

## 2020-03-05 DIAGNOSIS — I739 Peripheral vascular disease, unspecified: Secondary | ICD-10-CM | POA: Diagnosis not present

## 2020-03-05 DIAGNOSIS — I509 Heart failure, unspecified: Secondary | ICD-10-CM

## 2020-03-05 DIAGNOSIS — Z7901 Long term (current) use of anticoagulants: Secondary | ICD-10-CM | POA: Diagnosis not present

## 2020-03-05 DIAGNOSIS — I110 Hypertensive heart disease with heart failure: Secondary | ICD-10-CM | POA: Diagnosis not present

## 2020-03-05 DIAGNOSIS — R079 Chest pain, unspecified: Secondary | ICD-10-CM | POA: Diagnosis not present

## 2020-03-05 LAB — BASIC METABOLIC PANEL
Anion gap: 9 (ref 5–15)
BUN: 19 mg/dL (ref 8–23)
CO2: 27 mmol/L (ref 22–32)
Calcium: 8.7 mg/dL — ABNORMAL LOW (ref 8.9–10.3)
Chloride: 100 mmol/L (ref 98–111)
Creatinine, Ser: 1.34 mg/dL — ABNORMAL HIGH (ref 0.61–1.24)
GFR calc Af Amer: >60 mL/min (ref >60)
GFR calc non Af Amer: 52 mL/min — ABNORMAL LOW (ref >60)
Glucose, Bld: 266 mg/dL — ABNORMAL HIGH (ref 70–99)
Potassium: 5.7 mmol/L — ABNORMAL HIGH (ref 3.5–5.1)
Sodium: 136 mmol/L (ref 135–145)

## 2020-03-05 LAB — CBC
HCT: 43.3 % (ref 39.0–52.0)
Hemoglobin: 13.9 g/dL (ref 13.0–17.0)
MCH: 30.2 pg (ref 26.0–34.0)
MCHC: 32.1 g/dL (ref 30.0–36.0)
MCV: 94.1 fL (ref 80.0–100.0)
Platelets: 180 10*3/uL (ref 150–400)
RBC: 4.6 MIL/uL (ref 4.22–5.81)
RDW: 19.3 % — ABNORMAL HIGH (ref 11.5–15.5)
WBC: 6.9 10*3/uL (ref 4.0–10.5)
nRBC: 0 % (ref 0.0–0.2)

## 2020-03-05 LAB — TROPONIN I (HIGH SENSITIVITY)
Troponin I (High Sensitivity): 19 ng/L — ABNORMAL HIGH (ref <18)
Troponin I (High Sensitivity): 16 ng/L (ref <18)

## 2020-03-05 LAB — DIGOXIN LEVEL: Digoxin Level: 1.4 ng/mL (ref 0.8–2.0)

## 2020-03-05 LAB — PROTIME-INR
INR: 1.3 — ABNORMAL HIGH (ref 0.8–1.2)
Prothrombin Time: 16.1 seconds — ABNORMAL HIGH (ref 11.4–15.2)

## 2020-03-05 LAB — BRAIN NATRIURETIC PEPTIDE: B Natriuretic Peptide: 457.7 pg/mL — ABNORMAL HIGH (ref 0.0–100.0)

## 2020-03-05 MED ORDER — FUROSEMIDE 10 MG/ML IJ SOLN
80.0000 mg | Freq: Once | INTRAMUSCULAR | Status: AC
  Administered 2020-03-05: 80 mg via INTRAVENOUS
  Filled 2020-03-05: qty 8

## 2020-03-05 MED ORDER — METOLAZONE 2.5 MG PO TABS
2.5000 mg | ORAL_TABLET | Freq: Once | ORAL | Status: AC
  Administered 2020-03-05: 16:00:00 2.5 mg via ORAL
  Filled 2020-03-05: qty 1

## 2020-03-05 NOTE — ED Notes (Signed)
Lab to add on PT-INR

## 2020-03-05 NOTE — Telephone Encounter (Signed)
Called pt to advise. No answer/unable to leave voice message will try patient again later

## 2020-03-05 NOTE — Discharge Instructions (Signed)
If you are feeling back to normal tomorrow just take your normal dose of Lasix.  However if you still feel winded with activity you can double your Lasix dose tomorrow and Thursday.  Talk with the heart failure clinic tomorrow so that they can make sure you are doing well.  If you start getting worsening shortness of breath, more significant chest pain or any other concerns return to the emergency room.

## 2020-03-05 NOTE — ED Provider Notes (Signed)
[facility] EMERGENCY DEPARTMENT Provider Note   CSN: [id number] Arrival date & time: 03/05/20  1025     History Chief Complaint  Patient presents with  . Chest Pain    [name] [name] is a 74 y.o. male.   74 y/o male with hx of CAD s/p CABG, mechanical AVR on coumadin, extensive PVD, DM, HTN, HLD, ITP, TIA, and recurrent GI bleed with last stent placed in 4403 with complicating heart failure, A. fib and cardiogenic shock requiring milrinone and Lasix for diuresis as well as recent stent placed in his SFA on Friday who is presenting with gradually worsening exertional dyspnea and intermittent chest tightness.  Patient reports he had to hold his Lasix, Coumadin, Entresto and Aldactone for 4 days prior to his stenting of his SFA.  He noticed Thursday morning after walking up some stairs he became significantly winded and had some palpitations and chest pressure.  This resolved with rest.  He had the procedure done Friday and restarted all of his medications.  He is bridging with Lovenox and taking the Coumadin at this time.  However over the weekend and even until yesterday he has now gained 4 pounds and continues to have exertional dyspnea.  He notes it most if he tries to walk up steps which normally he could do with ease as he works out at [employer] regularly and does not develop chest pain or shortness of breath.  He is still able to walk around his house without feeling short of breath or having chest pressure and also is able to sleep in his bed at night without symptoms of orthopnea.  He has had a nonproductive mild cough but no resting dyspnea.  No fever.  Fully vaccinated against Covid last shot was in February.  He has been taking 40 mg of Lasix since Friday and tried to call the cardiology office yesterday and left a message but no one called him back.  He is complaining of some mild tightness in his chest today which he reports is new.  The history is provided by  the patient.  Chest Pain      Past Medical History:  Diagnosis Date  . Adenomatous colon polyp 09/1997  . Anemia   . Aortic stenosis    s/p [facility] mechanical AVR - Chronic Coumadin  . Blood transfusion    "related to ITP"  . Coronary artery disease    s/p cabg x 3 11/2003: lima-lad, seq vg to rpda and rpl  . Diverticulitis of colon   . Heart murmur   . Hyperlipidemia   . Hypertension   . ITP (idiopathic thrombocytopenic purpura)   . Peripheral arterial disease ([place])    a. history of aortobifemoral bypass grafting by Dr. Sherren Mocha early b. LE angiography 04/22/2015 patent aortobifem graft, DES to R SFA  . Peripheral vascular disease ([place])    s/p Left external Iliac Artery stenting and subsequent left femoral endarterectomy 02/2011- post op course complicated by wound infxn req I&D 03/2011  . Renal artery stenosis, native, bilateral (HCC)    a. bilateral renal artery stenosis by recent duplex ultrasound b. L renal artery stent 02/2015, R renal artery patent on angiogram  . Stroke ([place])   . TIA (transient ischemic attack) ~ 2013  . Type II diabetes mellitus [facility])     Patient Active Problem List   Diagnosis Date Noted  . Claudication in peripheral vascular disease ([place]) 02/29/2020  . Paroxysmal atrial fibrillation ([place]) 10/30/2019  .  Acute systolic heart failure ([place]) 10/06/2019  . Acute diastolic heart failure ([place]) 09/19/2019  . History of GI bleed 09/19/2019  . Unstable angina ([place]) 09/17/2019  . NSTEMI (non-ST elevated myocardial infarction) ([place]) 09/16/2019  . H/O colonoscopy with polypectomy   . Hematochezia   . GI bleed 09/02/2019  . Supratherapeutic INR 02/23/2019  . Gastrointestinal hemorrhage with melena 02/23/2019  . Acute blood loss anemia 02/23/2019  . Symptomatic anemia 02/23/2019  . TIA (transient ischemic attack) 12/20/2017  . Stroke-like symptoms 12/14/2017  . Claudication ([place]) 10/01/2016  . Renal artery arteriosclerosis ([place]) 02/22/2015  . Renal artery  stenosis ([place]) 02/21/2015  . Renal artery stenosis, native, bilateral ([place]) 02/08/2015  . H/O mechanical aortic valve replacement 07/20/2014  . Encounter for therapeutic drug monitoring 03/13/2014  . Cerebral infarction ([place]) 05/05/2013  . Aortoiliac occlusive disease ([place]) 01/10/2013  . PVD (peripheral vascular disease) with claudication ([place]) 02/02/2012  . Atherosclerosis of native arteries of extremity with intermittent claudication ([place]) 01/12/2012  . Coronary artery disease   . Aortic stenosis   . Peripheral vascular disease ([place])   . Hypertension   . Hyperlipidemia   . ITP (idiopathic thrombocytopenic purpura)   . Coronary atherosclerosis 10/21/2010  . Transient cerebral ischemia 10/10/2009  . Diabetes ([place]) 10/22/2008  . HYPERCHOLESTEROLEMIA 10/22/2008  . Immune thrombocytopenic purpura ([place]) 10/22/2008  . Essential hypertension 10/22/2008  . Aortic valve disorder 10/22/2008  . PVD (peripheral vascular disease) ([place]) 10/22/2008  . DIVERTICULITIS, COLON 10/22/2008  . COLONIC POLYPS, HX OF 10/22/2008    Past Surgical History:  Procedure Laterality Date  . ABDOMINAL AORTAGRAM N/A 12/16/2011   Procedure: ABDOMINAL Maxcine Ham;  Surgeon: Sherren Mocha, MD;  Location: [facility] CATH LAB;  Service: Cardiovascular;  Laterality: N/A;  . ABDOMINAL AORTOGRAM W/LOWER EXTREMITY Right 02/29/2020   Procedure: ABDOMINAL AORTOGRAM W/LOWER EXTREMITY;  Surgeon: Lorretta Harp, MD;  Location: [facility] CV LAB;  Service: Cardiovascular;  Laterality: Right;  . ANGIOPLASTY / STENTING ILIAC     Left external Iliac Artery  . AORTA - BILATERAL FEMORAL ARTERY BYPASS GRAFT  01/18/2012   Procedure: AORTA BIFEMORAL BYPASS GRAFT;  Surgeon: Curt Jews, MD;  Location: [facility];  Service: Vascular;  Laterality: N/A;  . AORTIC VALVE REPLACEMENT  ~ 2004  . CARDIAC CATHETERIZATION  11/2003   /pt report 10/01/2016  . CARDIAC VALVE REPLACEMENT  11/2003   aortic  . CARDIOVERSION N/A 10/16/2019   Procedure:  CARDIOVERSION;  Surgeon: Larey Dresser, MD;  Location: [facility] ENDOSCOPY;  Service: Cardiovascular;  Laterality: N/A;  . CATARACT EXTRACTION W/ INTRAOCULAR LENS  IMPLANT, BILATERAL Bilateral   . COLONOSCOPY    . COLONOSCOPY WITH PROPOFOL N/A 09/03/2019   Procedure: COLONOSCOPY WITH PROPOFOL;  Surgeon: Doran Stabler, MD;  Location: [facility] ENDOSCOPY;  Service: Gastroenterology;  Laterality: N/A;  . CORONARY ARTERY BYPASS GRAFT  11/2003   Archie Endo 04/21/2011  . CORONARY ATHERECTOMY N/A 10/10/2019   Procedure: CORONARY ATHERECTOMY;  Surgeon: Sherren Mocha, MD;  Location: [facility] CV LAB;  Service: Cardiovascular;  Laterality: N/A;  . CORONARY STENT INTERVENTION N/A 10/10/2019   Procedure: CORONARY STENT INTERVENTION;  Surgeon: Sherren Mocha, MD;  Location: [facility] CV LAB;  Service: Cardiovascular;  Laterality: N/A;  . CORONARY/GRAFT ANGIOGRAPHY N/A 09/18/2019   Procedure: CORONARY/GRAFT ANGIOGRAPHY;  Surgeon: Lorretta Harp, MD;  Location: [facility] CV LAB;  Service: Cardiovascular;  Laterality: N/A;  . ESOPHAGOGASTRODUODENOSCOPY (EGD) WITH PROPOFOL N/A 02/27/2019   Procedure: ESOPHAGOGASTRODUODENOSCOPY (EGD) WITH PROPOFOL;  Surgeon: Doran Stabler, MD;  Location: [facility] ENDOSCOPY;  Service: Endoscopy;  Laterality: N/A;  . HEMOSTASIS CLIP PLACEMENT  09/03/2019   Procedure: HEMOSTASIS CLIP PLACEMENT;  Surgeon: Doran Stabler, MD;  Location: [facility] ENDOSCOPY;  Service: Gastroenterology;;  . LOWER EXTREMITY ANGIOGRAM N/A 02/21/2015   Procedure: LOWER EXTREMITY ANGIOGRAM;  Surgeon: Lorretta Harp, MD;  Location: [facility] CATH LAB;  Service: Cardiovascular;  Laterality: N/A;  . PERIPHERAL VASCULAR CATHETERIZATION N/A 04/22/2015   Procedure: Lower Extremity Angiography;  Surgeon: Lorretta Harp, MD;  Location: [facility] CV LAB;  Service: Cardiovascular;  Laterality: N/A;  . PERIPHERAL VASCULAR CATHETERIZATION N/A 08/24/2016   Procedure: Lower Extremity Angiography;  Surgeon: Lorretta Harp, MD;   Location: [facility] CV LAB;  Service: Cardiovascular;  Laterality: N/A;  . PERIPHERAL VASCULAR CATHETERIZATION Right 10/01/2016   Procedure: Peripheral Vascular Intervention - STENT;  Surgeon: Lorretta Harp, MD;  Location: [facility] CV LAB;  Service: Cardiovascular;  Laterality: Right;  Prox and MID SFA   . PERIPHERAL VASCULAR INTERVENTION  02/29/2020   Procedure: PERIPHERAL VASCULAR INTERVENTION;  Surgeon: Lorretta Harp, MD;  Location: [facility] CV LAB;  Service: Cardiovascular;;  Right SFA  . POLYPECTOMY    . RENAL ANGIOGRAM N/A 02/21/2015   Procedure: RENAL ANGIOGRAM;  Surgeon: Lorretta Harp, MD;  Location: [facility] CATH LAB;  Service: Cardiovascular;  Laterality: Bilateral; 6 mm x 12 mm long Herculink balloon expandable stent to the left renal artery  . RENAL ARTERY STENT Left 04/22/2015   dr berry  . SPLENECTOMY  02/2003   Archie Endo 04/21/2011  . TEE WITHOUT CARDIOVERSION N/A 10/16/2019   Procedure: TRANSESOPHAGEAL ECHOCARDIOGRAM (TEE);  Surgeon: Larey Dresser, MD;  Location[facility] ENDOSCOPY;  Service: Cardiovascular;  Laterality: N/A;  . TEMPORARY PACEMAKER N/A 10/10/2019   Procedure: TEMPORARY PACEMAKER;  Surgeon: Sherren Mocha, MD;  Location: [facility] CV LAB;  Service: Cardiovascular;  Laterality: N/A;  . TONSILLECTOMY  ~ 1952       Family History  Problem Relation Age of Onset  . Coronary artery disease Mother        bypass surgery - deceased  . Heart disease Father        murmur, valve replacement - deceased  . Breast cancer Sister   . Diabetes Other        grandmother  . Diabetes Paternal Grandmother   . Diabetes Paternal Aunt   . Colon cancer Neg Hx   . Colon polyps Neg Hx   . Esophageal cancer Neg Hx   . Rectal cancer Neg Hx   . Stomach cancer Neg Hx     Social History   Tobacco Use  . Smoking status: Light Tobacco Smoker    Years: 30.00    Types: Cigarettes, Cigars    Last attempt to quit: 12/15/1993    Years since quitting: 26.2  . Smokeless tobacco:  Never Used  . Tobacco comment: occasional cigar  Substance Use Topics  . Alcohol use: Yes    Alcohol/week: 16.0 standard drinks    Types: 1 Cans of beer, 1 Shots of liquor, 14 Standard drinks or equivalent per week    Comment: drinks 2 martini's a night (2 shots in each)  . Drug use: No    Home Medications Prior to Admission medications   Medication Sig Start Date End Date Taking? Authorizing Provider  amiodarone (PACERONE) 100 MG tablet Take 1 tablet (100 mg total) by mouth daily. 02/23/20   Larey Dresser, MD  amoxicillin (AMOXIL) 500 MG capsule Take  2,000 mg by mouth See admin instructions. Take 2000 mg by mouth 30-60 minutes before appointment 06/08/19   [provider]  aspirin EC 81 MG tablet Take 81 mg by mouth daily. Pre- procedure    [provider]  atorvastatin (LIPITOR) 80 MG tablet Take 80 mg by mouth daily.     [provider]  canagliflozin (INVOKANA) 300 MG TABS tablet Take 300 mg by mouth daily.     [provider]  carvedilol (COREG) 3.125 MG tablet Take 1 tablet (3.125 mg total) by mouth 2 (two) times daily. 02/23/20 05/23/20  Larey Dresser, MD  clopidogrel (PLAVIX) 75 MG tablet Take 1 tablet (75 mg total) by mouth daily. 10/18/19   Clegg, Amy D, NP  digoxin (LANOXIN) 0.125 MG tablet TAKE 1 TABLET BY MOUTH DAILY 12/05/19   Larey Dresser, MD  enoxaparin (LOVENOX) 120 MG/0.8ML injection Inject 0.8 mLs (120 mg total) into the skin daily. 02/27/20   Josue Hector, MD  Evolocumab (REPATHA SURECLICK) 707 MG/ML SOAJ Inject 140 mg into the skin every 14 (fourteen) days.    [provider]  furosemide (LASIX) 40 MG tablet Take 40 mg by mouth daily.    [provider]  glyBURIDE (DIABETA) 2.5 MG tablet Take 2.5 mg by mouth 2 (two) times daily with a meal.     [provider]  nitroGLYCERIN (NITROSTAT) 0.4 MG SL tablet Place 1 tablet (0.4 mg total) under the tongue every 5 (five) minutes x 3 doses as needed for chest  pain. 09/19/19   Daune Perch, NP  pantoprazole (PROTONIX) 40 MG tablet Take 40 mg by mouth daily. 09/22/19   [provider]  potassium chloride SA (KLOR-CON) 20 MEQ tablet Take 1 tablet (20 mEq total) by mouth daily. 10/17/19   Clegg, Amy D, NP  sacubitril-valsartan (ENTRESTO) 24-26 MG Take 1 tablet by mouth 2 (two) times daily. 12/04/19   Larey Dresser, MD  spironolactone (ALDACTONE) 25 MG tablet Take 1 tablet (25 mg total) by mouth daily. 10/18/19   Clegg, Amy D, NP  warfarin (COUMADIN) 5 MG tablet Take 5-10 mg by mouth See admin instructions. Take 10 mg by mouth on Sunday and Thursday, all remaining days take 5 mg daily    [provider]    Allergies    Patient has no known allergies.  Review of Systems   Review of Systems  Cardiovascular: Positive for chest pain.  All other systems reviewed and are negative.   Physical Exam Updated Vital Signs BP (!) 138/45   Pulse (!) 39   Temp 98 F (36.7 C) (Oral)   Resp 17   Ht 5\' 8"  (1.727 m)   Wt 81.7 kg   SpO2 95%   BMI 27.39 kg/m   Physical Exam Vitals and nursing note reviewed.  Constitutional:      General: He is not in acute distress.    Appearance: He is well-developed and normal weight.  HENT:     Head: Normocephalic and atraumatic.  Eyes:     Conjunctiva/sclera: Conjunctivae normal.     Pupils: Pupils are equal, round, and reactive to light.  Cardiovascular:     Rate and Rhythm: Normal rate and regular rhythm.     Pulses: Normal pulses.     Heart sounds: No murmur.  Pulmonary:     Effort: Pulmonary effort is normal. No respiratory distress.     Breath sounds: Normal breath sounds. No wheezing or rales.  Abdominal:  General: There is no distension.     Palpations: Abdomen is soft.     Tenderness: There is no abdominal tenderness. There is no guarding or rebound.  Musculoskeletal:        General: No tenderness. Normal range of motion.     Cervical back: Normal range of motion and neck  supple.     Comments: Trace edema present at the ankles  Skin:    General: Skin is warm and dry.     Findings: No erythema or rash.  Neurological:     General: No focal deficit present.     Mental Status: He is alert and oriented to person, place, and time. Mental status is at baseline.  Psychiatric:        Mood and Affect: Mood normal.        Behavior: Behavior normal.        Thought Content: Thought content normal.     ED Results / Procedures / Treatments   Labs (all labs ordered are listed, but only abnormal results are displayed) Labs Reviewed  BASIC METABOLIC PANEL - Abnormal; Notable for the following components:      Result Value   Potassium 5.7 (*)    Glucose, Bld 266 (*)    Creatinine, Ser 1.34 (*)    Calcium 8.7 (*)    GFR calc non Af Amer 52 (*)    All other components within normal limits  CBC - Abnormal; Notable for the following components:   RDW 19.3 (*)    All other components within normal limits  BRAIN NATRIURETIC PEPTIDE - Abnormal; Notable for the following components:   B Natriuretic Peptide 457.7 (*)    All other components within normal limits  PROTIME-INR - Abnormal; Notable for the following components:   Prothrombin Time 16.1 (*)    INR 1.3 (*)    All other components within normal limits  TROPONIN I (HIGH SENSITIVITY) - Abnormal; Notable for the following components:   Troponin I (High Sensitivity) 19 (*)    All other components within normal limits  DIGOXIN LEVEL  TROPONIN I (HIGH SENSITIVITY)    EKG EKG Interpretation  Date/Time:  Tuesday March 05 2020 10:30:12 EDT Ventricular Rate:  41 PR Interval:  176 QRS Duration: 98 QT Interval:  490 QTC Calculation: 404 R Axis:   102 Text Interpretation: Marked sinus bradycardia Rightward axis Nonspecific ST and T wave abnormality  less pronounced since last tracing Confirmed by Blanchie Dessert [phone]) on 03/05/2020 11:43:13 AM   Radiology DG Chest 2 View  Result Date: 03/05/2020 CLINICAL  DATA:  Chest pain EXAM: CHEST - 2 VIEW COMPARISON:  10/06/2019 FINDINGS: The heart size and mediastinal contours are within normal limits. Evidence of prior CABG and valve replacement. No new consolidation or edema. Stable bibasilar mild interstitial prominence. No pleural effusion or pneumothorax. No acute osseous abnormality. IMPRESSION: No acute process in the chest. Electronically Signed   By: Macy Mis M.D.   On: 03/05/2020 11:09    Procedures Procedures (including critical care time)  Medications Ordered in ED Medications - No data to display  ED Course  I have reviewed the triage vital signs and the nursing notes.  Pertinent labs & imaging results that were available during my care of the patient were reviewed by me and considered in my medical decision making (see chart for details).  Clinical Course as of Mar 06 712  Tue Mar 05, 2020  1616 DG Chest 2 View [TH]  Clinical Course User Index [TH] Anthoney Harada, NP   MDM Rules/Calculators/A&P                     Elderly male with numerous cardiac issues including CAD, PAD, stents and mechanical valve presenting with exertional dyspnea and chest pain since Thursday.  Concern for possible CHF given he had to hold his medications for 4 days.  Patient is in no acute distress at this time but does describe some chest tightness.  EKG does show significant bradycardia in the 30s and 40s which he has had in the past and at times has even had a hold rhythm control medication.  He denies any infectious symptoms and low concern for clotting as he has been on multiple blood thinners in the past.  Patient denies any GI bleeding and low suspicion for anemia at this time.  Patient did have significant CHF resulting in cardiogenic shock in October requiring milrinone to adequately diurese him.  He has been back on meds since Friday but has not increased Lasix.  Will check labs today to evaluate renal function and fluid status.  Will discuss with  cardiology given patient's complicated cardiac history.  2:07 PM Patient's INR is still subtherapeutic at 1.3 however he is taking Lovenox at this time.  Troponin has improved from the 900s 216 and 19.  Digoxin level is normal, BNP is elevated at about 450 but not as high as it has been in the past.  Chest x-ray is clear and BMP with stable creatinine and mild elevated potassium but specimen was hemolyzed.  Will discuss with cardiology.  Suspect patient can most likely receive Lasix and follow-up closely with the heart failure clinic.  2:54 PM Spoke with Dr. Haroldine Laws who recommended giving an IV dose of 80 of Lasix and 2.5 of metolazone.  Patient states he otherwise feels comfortable going home and he is continue to urinate here.  Also encourage patient to call heart failure clinic tomorrow for close follow-up.  He will also inform the Coumadin clinic that his INR is 1.3 and will continue to do Lovenox at this time.  Patient was given strict return precautions.  At that time they can also evaluate if he needs to have reduction of beta-blockers due to persistent bradycardia.  However this does not appear to be new based on prior recent office visits and procedure on Friday.  Final Clinical Impression(s) / ED Diagnoses Final diagnoses:  Acute on chronic congestive heart failure, unspecified heart failure type [facility])    Rx / DC Orders ED Discharge Orders    None       Blanchie Dessert, MD 03/06/20 [phone]

## 2020-03-05 NOTE — ED Triage Notes (Signed)
Pt reports chest tightness with exertion since Thursday. Had stent placed in groin on Friday. Cardiologist had him hold his diuretics last week and he has noticed ankle swelling and weight gain.

## 2020-03-05 NOTE — Telephone Encounter (Signed)
2nd attempt Called pt again no answer/unable to leave VM

## 2020-03-06 ENCOUNTER — Ambulatory Visit (INDEPENDENT_AMBULATORY_CARE_PROVIDER_SITE_OTHER): Payer: Medicare Other | Admitting: Internal Medicine

## 2020-03-06 DIAGNOSIS — G459 Transient cerebral ischemic attack, unspecified: Secondary | ICD-10-CM | POA: Diagnosis not present

## 2020-03-06 DIAGNOSIS — I359 Nonrheumatic aortic valve disorder, unspecified: Secondary | ICD-10-CM | POA: Diagnosis not present

## 2020-03-06 DIAGNOSIS — Z5181 Encounter for therapeutic drug level monitoring: Secondary | ICD-10-CM

## 2020-03-06 NOTE — Telephone Encounter (Signed)
Unable to reach patient yesterday because he went to the ED to be evaluated. Pt scheduled for ED f/u on 4/1.

## 2020-03-06 NOTE — Patient Instructions (Signed)
Description   Pt had procedure on 3/25, restarted warfarin on 3/26, INR checked in ED on 3/30. Advised pt to take 2 tablets today, then continue taking 1 tablet (5mg ) daily except for 2 tablets (10mg ) on Sundays and Thursdays. Continue Lovenox injections once daily. Recheck INR on Monday (has enough Lovenox to last until this appt). Call coumadin Clinic for any changes in medications or upcoming procedures. [phone]

## 2020-03-07 ENCOUNTER — Ambulatory Visit (HOSPITAL_BASED_OUTPATIENT_CLINIC_OR_DEPARTMENT_OTHER)
Admission: RE | Admit: 2020-03-07 | Discharge: 2020-03-07 | Disposition: A | Discharge status: Home / Self Care | Payer: Medicare Other | Source: Ambulatory Visit | Attending: Cardiovascular Disease | Admitting: Cardiovascular Disease

## 2020-03-07 ENCOUNTER — Encounter (HOSPITAL_COMMUNITY): Payer: Self-pay

## 2020-03-07 ENCOUNTER — Other Ambulatory Visit: Payer: Self-pay

## 2020-03-07 ENCOUNTER — Other Ambulatory Visit: Payer: Self-pay | Admitting: Cardiovascular Disease

## 2020-03-07 ENCOUNTER — Ambulatory Visit (HOSPITAL_COMMUNITY)
Admission: RE | Admit: 2020-03-07 | Discharge: 2020-03-07 | Disposition: A | Discharge status: Home / Self Care | Payer: Medicare Other | Source: Ambulatory Visit | Attending: Cardiology | Admitting: Cardiology

## 2020-03-07 ENCOUNTER — Ambulatory Visit (HOSPITAL_BASED_OUTPATIENT_CLINIC_OR_DEPARTMENT_OTHER)
Admission: RE | Admit: 2020-03-07 | Discharge: 2020-03-07 | Disposition: A | Discharge status: Home / Self Care | Payer: Medicare Other | Source: Ambulatory Visit | Attending: Cardiology | Admitting: Cardiology

## 2020-03-07 ENCOUNTER — Other Ambulatory Visit (HOSPITAL_COMMUNITY): Payer: Self-pay | Admitting: Cardiology

## 2020-03-07 VITALS — BP 100/62 | HR 50 | Ht 68.0 in | Wt 168.0 lb

## 2020-03-07 DIAGNOSIS — I6529 Occlusion and stenosis of unspecified carotid artery: Secondary | ICD-10-CM

## 2020-03-07 DIAGNOSIS — Z1212 Encounter for screening for malignant neoplasm of rectum: Secondary | ICD-10-CM | POA: Diagnosis not present

## 2020-03-07 DIAGNOSIS — R0989 Other specified symptoms and signs involving the circulatory and respiratory systems: Secondary | ICD-10-CM | POA: Insufficient documentation

## 2020-03-07 DIAGNOSIS — I5022 Chronic systolic (congestive) heart failure: Secondary | ICD-10-CM | POA: Diagnosis not present

## 2020-03-07 DIAGNOSIS — I739 Peripheral vascular disease, unspecified: Secondary | ICD-10-CM | POA: Insufficient documentation

## 2020-03-07 MED ORDER — DIGOXIN 125 MCG PO TABS: 125.0000 ug | ORAL_TABLET | ORAL | 3 refills | Status: DC

## 2020-03-07 NOTE — Progress Notes (Signed)
PCP: Shon Baton, MD HF Cardiology: Dr. Aundra Dubin  Reason For Visit: Post ED F/u for A/c Systolic HF   [name] [name] [facility] a 74 y.o.malewith a history of CAD s/p CABG, mechanical AVR on coumadin, extensive PAD, DM, HTN, HLD, ITP, TIA, and recurrent GI bleed. Note, requires lovenox bridging off heparin.  Dr. Gwenlyn Found follows him for extensive PAD. He has had an aortobifem bypass in 2016 with follow up iliac stenting and femoral endarterectomy. In 2017, he had subsequent ostial and mid right SFA intervention. Hx of left renal artery stenting in 2016 with repeat intervention on left for 75% in-stent restenosis, with progression of disease on the right renal artery showing 60% stenosis on duplex 07/12/19. Echo 09/17/19 with normal EF of 60-65% and normal AVR function; chronic diastolic heart failure.   Admitted in 10/20 with chest pain. He underwent heart cath 09/18/19 that showed 99% left main stenosis and patent LIMA to occluded LAD and a patent sequential vein to the PDA and PLA of an occluded dominant right. His left main stenosis jeopardized the moderately large ramus and high first marginal and nondominant Cx which has 90% mid AV groove Cx stenosis. Aggressive medical therapy was recommended initially.  He was readmitted in 10/20 with CHF and chest pain. Hospital course complicated by A flutter/low output heart failure.  S/P successful orbital atherectomy, PTCA, and stenting of protected left main with a 4.0x15 mm resolute onyx DES 11/3. LIMA-LAD patent. SVG-PDA patent. ECHO this admission showed EF 30-35%, mechanical AoV ok. TEE 11/9 w/ improved EF, 40-45%. Placed on milrinone initially with low co-ox and later weaned off. He was cardioverted from atrial flutter back to NSR.   2/21 peripheral arterial dopplers showed 75-99% stenosis right SFA.   Had recent f/u w/ Dr. Aundra Dubin on 3/19. Doing well from cardiac/ HF standpoint. EKG showed NSR. Amiodarone was reduced down to 100 mg daily. Diuretic/HF  regimen unchanged.   On 3/25, he was scheduled for planned PV intervention for right SFA stenosis. Prior to procedure, he was instructed to hold lasix x 3 days for renal protection given planned use of contrast. He tolerated procedure well and had successful angioplasty/ stenting of rt SFA. placed on ASA and Plavix. Diuretics were resumed. He was discharged home on 3/26. A few days later, he developed LEE and exertional dyspnea + chest pressure and went to the ED for evaluation on 3/10. Felt to be fluid overloaded w/ mild acute CHF exacerbation. Hs troponin normal 19>>16. Chest pressure felt 2/2 fluid overload. He was given a dose of IV Lasix in the ED and sent home.   He presents back to clinic for post ED f/u. Feels much better. Wt dropped 6 lb after getting IV Lasix. LEE resolved. Feels back to baseline. No further exertional dyspnea or CP. Denies orthopnea/PND. Reports full med compliance. Claudication has nearly resolved. Now going to the gym regularly. BP 100/62 and pulse rate 50 c/w baseline. EKG shows sinus bradycardia, 49 bpm. Asymptomatic. Denies fatigue. No dizziness, syncope/ near syncope.   ECG (personally reviewed): sinus bradycardia, 49 bpm, 1st degree AVB   Labs (11/20): K 4.3, creatinine 1.3, hgb 12.5 Labs (12/20): LDL 56, HDL 80, TGs 179 Labs (1/21): K 4.8, creatinine 1.45 Labs (3/21): K 5.7, creatinine 1.34, digoxin 1.4   PMH  1. CAD:  S/P CABG (3/04) with LIMA-LAD, seq SVG-PDA/PLV.  - NSTEMI 10/20 with cath showing patent LIMA-LAD and SVG-PDA/PLV but 99% LM stenosis, occluded LAD, moderate ramus and high OM1 jeopardized, also 90% mid  AV groove LCx stenosis.  Occluded RCA.  Patient ultimately had orbital atherectomy and stenting of left main with a 4.0 x 15 mm Resolute Onyx DES.  2. Mechanical aortic valve: TEE 11/20 showed stable-appearing mechanical valve, mean gradient 9 mmHg.  3. PAD: Aortobifemoral bypass in 2016 with follow up iliac stenting and femoral endarterectomy. In  2017, he had subsequent ostial and mid right SFA intervention. - Angiography 10/20 with 80% in-stent restenosis in proximal right SFA.  - Peripheral arterial dopplers (2/21) with 75-99% R SFA stenosis in prior stented area.  4. Type 2 diabetes.  5. HTN 6. Hyperlipidemia 7. H/o ITP  8. TIA  9. Chronic Systolic HF: Ischemic cardiomyopathy.  - Echo (10/20): EF 30-35%, mechanical AoV ok.  - TEE (11/20): w/ improved EF, 40-45%. Mechanical aortic valve with mean gradient 9 mmHg, normal RV.  10. Atrial fibrillation: Paroxysmal.  11. Renal artery stenosis: Hx of left renal artery stenting in 2016 with repeat intervention on left for 75% in-stent restenosis, with progression of disease on the right renal artery showing 60% stenosis on duplex 07/12/19.  ROS: All systems negative except as listed in HPI, PMH and Problem List.  Social History   Socioeconomic History  . Marital status: Divorced    Spouse name: Not on file  . Number of children: 3  . Years of education: Not on file  . Highest education level: Not on file  Occupational History  . Occupation: Retired  Tobacco Use  . Smoking status: Former Smoker    Years: 30.00    Types: Cigarettes, Cigars    Quit date: 12/15/1993    Years since quitting: 26.2  . Smokeless tobacco: Never Used  . Tobacco comment: occasional cigar  Substance and Sexual Activity  . Alcohol use: Yes    Alcohol/week: 16.0 standard drinks    Types: 1 Cans of beer, 1 Shots of liquor, 14 Standard drinks or equivalent per week    Comment: drinks 2 martini's a night (2 shots in each)  . Drug use: No  . Sexual activity: Yes  Other Topics Concern  . Not on file  Social History Narrative   Tries to remain active.  Frequent golfer but claudication limits this.   Social Determinants of Health   Financial Resource Strain:   . Difficulty of Paying Living Expenses:   Food Insecurity:   . Worried About Charity fundraiser in the Last Year:   . Arboriculturist in the  Last Year:   Transportation Needs:   . Film/video editor (Medical):   [name] Lack of Transportation (Non-Medical):   Physical Activity:   . Days of Exercise per Week:   . Minutes of Exercise per Session:   Stress:   . Feeling of Stress :   Social Connections:   . Frequency of Communication with Friends and Family:   . Frequency of Social Gatherings with Friends and Family:   . Attends Religious Services:   . Active Member of Clubs or Organizations:   . Attends Archivist Meetings:   [name] Marital Status:   Intimate Partner Violence:   . Fear of Current or Ex-Partner:   . Emotionally Abused:   [name] Physically Abused:   . Sexually Abused:    Family History  Problem Relation Age of Onset  . Coronary artery disease Mother        bypass surgery - deceased  . Heart disease Father        murmur, valve replacement -  deceased  . Breast cancer Sister   . Diabetes Other        grandmother  . Diabetes Paternal Grandmother   . Diabetes Paternal Aunt   . Colon cancer Neg Hx   . Colon polyps Neg Hx   . Esophageal cancer Neg Hx   . Rectal cancer Neg Hx   . Stomach cancer Neg Hx     Current Outpatient Medications  Medication Sig Dispense Refill  . amiodarone (PACERONE) 100 MG tablet Take 1 tablet (100 mg total) by mouth daily. 90 tablet 1  . amoxicillin (AMOXIL) 500 MG capsule Take 2,000 mg by mouth See admin instructions. Take 2000 mg by mouth 30-60 minutes before appointment    . atorvastatin (LIPITOR) 80 MG tablet Take 80 mg by mouth daily.     . carvedilol (COREG) 3.125 MG tablet Take 1 tablet (3.125 mg total) by mouth 2 (two) times daily. 180 tablet 3  . clopidogrel (PLAVIX) 75 MG tablet Take 1 tablet (75 mg total) by mouth daily. 30 tablet 6  . digoxin (LANOXIN) 0.125 MG tablet TAKE 1 TABLET BY MOUTH DAILY 90 tablet 3  . enoxaparin (LOVENOX) 120 MG/0.8ML injection Inject 0.8 mLs (120 mg total) into the skin daily. 8 mL 1  . Evolocumab (REPATHA SURECLICK) 366 MG/ML SOAJ Inject  140 mg into the skin every 14 (fourteen) days.    . furosemide (LASIX) 40 MG tablet Take 40 mg by mouth daily.    [name] glyBURIDE (DIABETA) 2.5 MG tablet Take 2.5 mg by mouth 2 (two) times daily with a meal.     . INVOKANA 100 MG TABS tablet Take 100 mg by mouth daily.    . nitroGLYCERIN (NITROSTAT) 0.4 MG SL tablet Place 1 tablet (0.4 mg total) under the tongue every 5 (five) minutes x 3 doses as needed for chest pain. 25 tablet 12  . pantoprazole (PROTONIX) 40 MG tablet Take 40 mg by mouth daily.    . potassium chloride SA (KLOR-CON) 20 MEQ tablet Take 1 tablet (20 mEq total) by mouth daily. 30 tablet 6  . sacubitril-valsartan (ENTRESTO) 24-26 MG Take 1 tablet by mouth 2 (two) times daily. 60 tablet 5  . spironolactone (ALDACTONE) 25 MG tablet Take 1 tablet (25 mg total) by mouth daily. 30 tablet 6  . warfarin (COUMADIN) 5 MG tablet Take 5-10 mg by mouth See admin instructions. Take 10 mg by mouth on Sunday and Thursday, all remaining days take 5 mg daily     Current Facility-Administered Medications  Medication Dose Route Frequency Provider Last Rate Last Admin  . sodium chloride flush (NS) 0.9 % injection 3 mL  3 mL Intravenous Q12H Lorretta Harp, MD        Vitals:   03/07/20 1503  BP: 100/62  Pulse: (!) 50  SpO2: 94%  Weight: 76.2 kg (168 lb)  Height: 5\' 8"  (1.727 m)   Wt Readings from Last 3 Encounters:  03/07/20 76.2 kg (168 lb)  03/05/20 81.7 kg (180 lb 1.9 oz)  03/01/20 81.7 kg (180 lb 1.9 oz)    PHYSICAL EXAM: General:  Well appearing. No respiratory difficulty HEENT: normal Neck: supple. no JVD. Carotids 2+ bilat;  + rt sided bruit. No lymphadenopathy or thyromegaly appreciated. Cor: PMI nondisplaced. Regular rhythm, slow rate. Crisp mechanical valve sounds Lungs: clear Abdomen: soft, nontender, nondistended. No hepatosplenomegaly. No bruits or masses. Good bowel sounds. Extremities: no cyanosis, clubbing, rash, edema Neuro: alert & oriented x 3, cranial nerves  grossly intact. moves  all 4 extremities w/o difficulty. Affect pleasant.    ASSESSMENT & PLAN: 1. Chronic Systolic CHF: Ischemic cardiomyopathy. Echo 10/06/19 showed EF 30-35% and wall motion abnormalities, prior echo earlier in 10/20 showed EF 50-55%. Cath 10/20 showed jeopardized ramus and LCx territory (99% distal left main, occluded LAD, patent LIMA-LAD). Concerned that ischemia in this territory triggered CHF, worsening of LV function.  Now s/p DES to left main, TEE in 11/20 after intervention showed EF higher at 40-45%.   - Recent mild acute CHF exacerbation after held diuretics prior to planned PV intervention w/ contrast. Improved w/ IV Lasix in ED. Did not required admission. - volume much improved and back to baseline.  - Functional status improved w/ diuresis, back to NYHA class II  - Continue Entresto 24/26 bid.  BP too soft for further titration    - Continue Lasix 40 mg daily.  - Continue spironolactone 25 mg daily - Given recent dig level of 1.4, will have him hold digoxin x 3 days, then resume 0.125 mg qod. Repeat Dig level early next week w/ repeat BMP.   - Continue Coreg 3.125 mg bid. Unable to titrate due to resting HR in low 50s (asymptomatic) - Plan repeat Echo at 3 month followup.  2. CAD: Complicated disease, coronary angiography 10/20 showed patent SVG-RCA territory and patent LIMA-LAD. The proximal LAD was occluded and there was 99% distal left main stenosis. This left the ramus and LCx in jeopardy. He was not a good candidate for protected PCI =>cannot place Impella with mechanical aortic valve and peripheral vascular disease likely precludes a femoral IABP. It was decided to try to manage him medically. However, he returned with progressive chest pain episodes and NSTEMI, hs-TnI up to 935. S/p successful orbital atherectomy and stenting of the left main with a 4.0x15 mm Resolute Onyx DES 10/10/19.No chest pain.   - Off ASA, continue Plavix for 1 year.  - Continue  warfarin for mechanical valve.    - Continue atorvastatin 80 daily + Repatha.  Good LDL in 12/20.  3. Mechanical aortic valve: Stable on 11/20 TEE. Crisp mechanical sounds on exam - Off ASA given Plavix use.  - Continue warfarin and will aim for INR 2.5-3 while on Plavix. -  INRs followed by [facility]. Recent CBC showed stable hgb 4. NSVT: Noted 10/20 admission, on amiodarone.  5. H/o GI bleeding: Continue PPI for GI protection. Recent CBC showed stable hgb 6. PAD: Patient with aorto-bifemoral bypass, h/o iliac stenting, h/o femoral endarterectomy, PCI to right SFA in 2017.  Angiography in 10/20 with 80% ISR in right SFA.  Recent peripheral arterial dopplers showed significant right SFA ISR.   - s/p recent abdominal angiography + intervention and stenting of rt SFA by Dr. Gwenlyn Found 3/25. Claudication resolved - continue Plavix + statin.  - f/u post intervention LE arterial doppler + ABIs scheduled.  7. H/o renal artery stenosis.  8. DM2: He is on SGLT2 inhibitor (canagliflozin).  10. Atrial fibrillation: Paroxysmal.  TEE-guided DCCV in 11/20, he remains in NSR.  - Continue amiodarone 100 mg daily, eventually would like him evaluated by EP for possible ablation.  - LFTs and TSH checked previous visit, 3/21 and ok.  He will need a regular eye exam.   -Continue coumadin. Dosing per coumadin clinic  11. Carotid bruit:  - carotid dopplers 03/07/20 showed 40-59% Rt ICA stenosis and 1-39% Lt ICA stenosis - will follow annually or when clinically indicated  - continue Plavix + statin  Repeat dig level + BMP in 1 week. Followup in 3 months with repeat echo.   Lyda Jester, PA-C 03/07/2020

## 2020-03-07 NOTE — Patient Instructions (Addendum)
Hold Digoxin for 3 days Then Restart Every Other Day  Return For Labs in 1 week (Digoxin level and BMP)  Keep follow up as scheduled with Dr Aundra Dubin  Do the following things EVERYDAY: 1) Weigh yourself in the morning before breakfast. Write it down and keep it in a log. 2) Take your medicines as prescribed 3) Eat low salt foods--Limit salt (sodium) to 2000 mg per day.  4) Stay as active as you can everyday 5) Limit all fluids for the day to less than 2 liters   At the [facility], you and your health needs are our priority. As part of our continuing mission to provide you with exceptional heart care, we have created designated Provider Care Teams. These Care Teams include your primary Cardiologist (physician) and Advanced Practice Providers (APPs- Physician Assistants and Nurse Practitioners) who all work together to provide you with the care you need, when you need it.   You may see any of the following providers on your designated Care Team at your next follow up: [name] Dr Glori Bickers . Dr Loralie Champagne . Darrick Grinder, NP . Lyda Jester, PA . Audry Riles, PharmD   Please be sure to bring in all your medications bottles to every appointment.

## 2020-03-11 ENCOUNTER — Ambulatory Visit (INDEPENDENT_AMBULATORY_CARE_PROVIDER_SITE_OTHER): Payer: Medicare Other | Admitting: *Deleted

## 2020-03-11 ENCOUNTER — Other Ambulatory Visit: Payer: Self-pay

## 2020-03-11 DIAGNOSIS — Z5181 Encounter for therapeutic drug level monitoring: Secondary | ICD-10-CM | POA: Diagnosis not present

## 2020-03-11 DIAGNOSIS — G459 Transient cerebral ischemic attack, unspecified: Secondary | ICD-10-CM

## 2020-03-11 DIAGNOSIS — I359 Nonrheumatic aortic valve disorder, unspecified: Secondary | ICD-10-CM

## 2020-03-11 LAB — POCT INR: INR: 3 (ref 2.0–3.0)

## 2020-03-11 NOTE — Patient Instructions (Addendum)
Description   Continue taking 1 tablet (5mg ) daily except for 2 tablets (10mg ) on Sundays and Thursdays. Recheck INR in 2 weeks. Coumadin Clinic for any changes in medications or upcoming procedures. [phone]

## 2020-03-12 ENCOUNTER — Other Ambulatory Visit (HOSPITAL_COMMUNITY): Payer: Self-pay

## 2020-03-12 ENCOUNTER — Ambulatory Visit (HOSPITAL_COMMUNITY)
Admission: RE | Admit: 2020-03-12 | Discharge: 2020-03-12 | Disposition: A | Discharge status: Home / Self Care | Payer: Medicare Other | Source: Ambulatory Visit | Attending: Cardiology | Admitting: Cardiology

## 2020-03-12 DIAGNOSIS — I5022 Chronic systolic (congestive) heart failure: Secondary | ICD-10-CM | POA: Insufficient documentation

## 2020-03-12 LAB — BASIC METABOLIC PANEL
Anion gap: 9 (ref 5–15)
BUN: 34 mg/dL — ABNORMAL HIGH (ref 8–23)
CO2: 26 mmol/L (ref 22–32)
Calcium: 8.9 mg/dL (ref 8.9–10.3)
Chloride: 99 mmol/L (ref 98–111)
Creatinine, Ser: 1.34 mg/dL — ABNORMAL HIGH (ref 0.61–1.24)
GFR calc Af Amer: >60 mL/min (ref >60)
GFR calc non Af Amer: 52 mL/min — ABNORMAL LOW (ref >60)
Glucose, Bld: 201 mg/dL — ABNORMAL HIGH (ref 70–99)
Potassium: 5.2 mmol/L — ABNORMAL HIGH (ref 3.5–5.1)
Sodium: 134 mmol/L — ABNORMAL LOW (ref 135–145)

## 2020-03-12 LAB — DIGOXIN LEVEL: Digoxin Level: 0.5 ng/mL — ABNORMAL LOW (ref 0.8–2.0)

## 2020-03-12 NOTE — Progress Notes (Signed)
[facility] [employer] PREP Weekly Session   Patient Details  Name: [name] [name] MRN: [id number] Date of Birth: [birth year] Age: 74 y.o. PCP: Shon Baton, MD  Vitals:   03/12/20 1614  Weight: 170 lb (77.1 kg)    [employer] [employer] Weekly seesion - 03/12/20 1600      Weekly Session   Topic Discussed  Expectations and non-scale victories    Minutes exercised this week  420 minutes    Classes attended to date  79      Grateful for: family, friends, and old cat Barriers/struggles: fluid retention   Barnett Hatter 03/12/2020, 4:15 PM

## 2020-03-14 ENCOUNTER — Ambulatory Visit (INDEPENDENT_AMBULATORY_CARE_PROVIDER_SITE_OTHER): Payer: Medicare Other | Admitting: Podiatry

## 2020-03-14 ENCOUNTER — Other Ambulatory Visit: Payer: Self-pay

## 2020-03-14 DIAGNOSIS — D689 Coagulation defect, unspecified: Secondary | ICD-10-CM | POA: Diagnosis not present

## 2020-03-14 DIAGNOSIS — R234 Changes in skin texture: Secondary | ICD-10-CM | POA: Diagnosis not present

## 2020-03-14 DIAGNOSIS — L84 Corns and callosities: Secondary | ICD-10-CM | POA: Diagnosis not present

## 2020-03-14 DIAGNOSIS — B351 Tinea unguium: Secondary | ICD-10-CM | POA: Diagnosis not present

## 2020-03-14 NOTE — Progress Notes (Signed)
  Subjective:  Patient ID: [name] [name], male    DOB: [birth year],  MRN: [id number]  Chief Complaint  Patient presents with  . Foot Pain    Pt states he has a wound on plantar heel, dried cracked skin, 3 months duration. Pt states it is painful but has been gradually improving. Denies drainage and denies fever/chills/nausea/vomiting.    74 y.o. male presents with the above complaint. History confirmed with patient.   Objective:  Physical Exam: warm, good capillary refill, no trophic changes or ulcerative lesions, normal DP and PT pulses and normal sensory exam. Left Foot: normal exam, no swelling, tenderness, instability; ligaments intact, full range of motion of all ankle/foot joints  Right Foot: fissured cracked heel right with pain to palpation   Assessment:   1. Skin fissure   2. Callus   3. Onychomycosis   4. Coagulation defect [facility])      Plan:  Patient was evaluated and treated and all questions answered.  Skin Fissure right heel -Educated on self care -Gently debrided callus area of lesions -Recommend urea cream / revitaderm cream  Onyhcomycosis with coagulation defect -Nails debrided x10  Procedure: Paring of Lesion Rationale: painful hyperkeratotic lesion Type of Debridement: manual, sharp debridement. Instrumentation: 312 blade Number of Lesions: 1    Procedure: Nail Debridement Rationale: Patient meets criteria for routine foot care due to coag defct Type of Debridement: manual, sharp debridement. Instrumentation: Nail nipper, rotary burr. Number of Nails: 10  Return in about 6 weeks (around 04/25/2020) for Skin fissure f/u.

## 2020-03-15 ENCOUNTER — Encounter: Payer: Self-pay | Admitting: Cardiovascular Disease

## 2020-03-15 ENCOUNTER — Ambulatory Visit (INDEPENDENT_AMBULATORY_CARE_PROVIDER_SITE_OTHER): Payer: Medicare Other | Admitting: Cardiovascular Disease

## 2020-03-15 VITALS — BP 128/58 | HR 39 | Ht 68.0 in | Wt 171.6 lb

## 2020-03-15 DIAGNOSIS — I6529 Occlusion and stenosis of unspecified carotid artery: Secondary | ICD-10-CM

## 2020-03-15 DIAGNOSIS — I739 Peripheral vascular disease, unspecified: Secondary | ICD-10-CM | POA: Diagnosis not present

## 2020-03-15 NOTE — Progress Notes (Signed)
03/15/2020 [name] [name]   October 18, 1946  [id number]  Primary Physician Shon Baton, MD Primary Cardiologist: Lorretta Harp MD [name], [place]  HPI:  [name] [name] is a 74 y.o.  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 I last saw him in the office  02/06/2020. He retired a little over 2 years ago having worked there for 25 years. His primary care physician is Dr. Virgina Jock. He was referred by Dr. Johnsie Cancel for peripheral vascular evaluation because of resistant hypertension with duplex evidence of bilateral renal artery stenosis as well as new-onset right calf claudication with recent Dopplers that showed a lesion in his mid right SFA. He does have a history of remote tobacco abuse as well as treated hypertension, hyperlipidemia and diabetes. He has had coronary bypass grafting and St. Jude aVR performed by Dr. Cyndia Bent December 2004 on Coumadin anticoagulation. He has also had aortobifemoral bypass grafting, left external iliac stenting and left common femoral endarterectomy performed by Dr. Donnetta Hutching. I performed left renal artery stenting 02/21/15 with an excellent angiographic and clinical result. His blood pressure has been under better control. I then perform staged right SFA directional atherectomy 04/22/15 removing a high-grade eccentric calcified mid to distal right SFA plaque. He did have progression of disease at the origin of his right SFA. His claudication markedly improved after his procedure however since I saw on the year ago he's had some recurrent claudication. His blood pressures have been under better control since his renal artery intervention. Recent lower extremity Doppler studies performed 07/15/16 revealed a right ABI 0.34 with a new high-frequency signal in the origin of his right SFA. I performed angiography 08/24/16 revealing a  95% proximal right SFA stenosis, 80% mid right SFA stenosis and 80% calcified mid left SFA stenosis. In addition, he had 75% "in-stent restenosis" within the previously placed left renal artery stent. Intervention will be somewhat challenging given the proximity of the proximal right SFA stenosis . He underwent peripheral angiography intervention by myself 10/01/16. Right antegrade approach. I was able to stick the hood of his aortobifem and intrarenal in the proximal right SFA with drug-eluting [place] plasty and stents. I also performed drug-eluting plasty in the mid right SFA. His symptoms resolved after that and he is back playing golf. He was discharged on the following day. His Dopplers have normalized. Since I saw him in the office a year ago he's remained stable. He denies claudication. His most recent Dopplers performed in July oflastyear revealed widely patent proximal right SFA stent and mid right SFA. Since I saw him several months ago he was recently admitted with a stroke. His INR was subtherapeutic for unclear reasons. He was re-anticoagulated. An MRI showed a small left frontal CVA. He did have some facial drooping and numbness which is slowly improving. He still denies claudication.  Since I saw him in the office 07/12/2018 he was admitted with chest pain and underwent diagnostic coronary angiography by myself on 07/18/2018 revealing high-grade calcified left main stenosis, occluded LAD proximally with a patent LIMA and high-grade mid AV groove circumflex. He had an occluded dominant RCA at its origin with a patent sequential vein to the PDA and PLA. At that time I elected to treat him medically because of the complexity of the revascularization procedure. Unfortunately, he was readmitted with heart failure and non-STEMI and underwent  orbital atherectomy, PCI and drug-eluting stenting by Dr. Burt Knack using a 4 mm x 15 mm long resolute Onyx drug-eluting stent. His mid AV groove circumflex  was not intervened on. His EF increased as a result from 30-35 to 40-45. In addition, he he underwent successful DC cardioversion from A. fib to sinus rhythm after TEE showed no evidence of intracardiac thrombi. He was discharged home on "triple therapy" including low-dose aspirin, Plavix and Coumadin anticoagulation for his Saint Jude mechanical valve with plan to drop his aspirin after 30 days. At the time of angiography in October I did note 80% "in-stent restenosis within his proximal right SFA stent and he does admit to claudication.  I performed lower extremity angiography on him 02/29/2020 via the left brachial approach.  I performed PTA and drug-coated angioplasty of a short segment CTO with the origin of his right SFA and area of "in-stent restenosis just beyond his aortobifemoral bypass graft insertion.  I then restented with a 6 mm x 40 mm long self-expanding stent.  He was discharged home the same day with Lovenox bridging.  His claudication resolved and his Dopplers normalized.  He was seen in the ER several days later for volume overload probably because of holding his heart failure medications prior to his intervention because of elevated serum creatinine.  After going back on his medications he feels clinically improved.  He is back at the gym exercising.  He is significantly bradycardic with a heart rate of 39 today only on low-dose carvedilol although he is asymptomatic from this.  He does have moderate right internal carotid artery stenosis which we are following by duplex ultrasound on annual basis.   Current Meds  Medication Sig  . amiodarone (PACERONE) 100 MG tablet Take 1 tablet (100 mg total) by mouth daily.  [name] amoxicillin (AMOXIL) 500 MG capsule Take 2,000 mg by mouth See admin instructions. Take 2000 mg by mouth 30-60 minutes before appointment  . atorvastatin (LIPITOR) 80 MG tablet Take 80 mg by mouth daily.   . carvedilol (COREG) 3.125 MG tablet Take 1 tablet (3.125 mg total)  by mouth 2 (two) times daily.  . clopidogrel (PLAVIX) 75 MG tablet Take 1 tablet (75 mg total) by mouth daily.  . digoxin (LANOXIN) 0.125 MG tablet Take 1 tablet (125 mcg total) by mouth every other day.  . enoxaparin (LOVENOX) 120 MG/0.8ML injection Inject 0.8 mLs (120 mg total) into the skin daily.  . Evolocumab (REPATHA SURECLICK) 761 MG/ML SOAJ Inject 140 mg into the skin every 14 (fourteen) days.  . furosemide (LASIX) 40 MG tablet Take 40 mg by mouth daily.  [name] glyBURIDE (DIABETA) 2.5 MG tablet Take 2.5 mg by mouth 2 (two) times daily with a meal.   . INVOKANA 100 MG TABS tablet Take 100 mg by mouth daily.  . nitroGLYCERIN (NITROSTAT) 0.4 MG SL tablet Place 1 tablet (0.4 mg total) under the tongue every 5 (five) minutes x 3 doses as needed for chest pain.  . pantoprazole (PROTONIX) 40 MG tablet Take 40 mg by mouth daily.  . potassium chloride SA (KLOR-CON) 20 MEQ tablet Take 1 tablet (20 mEq total) by mouth daily.  . sacubitril-valsartan (ENTRESTO) 24-26 MG Take 1 tablet by mouth 2 (two) times daily.  [name] spironolactone (ALDACTONE) 25 MG tablet Take 1 tablet (25 mg total) by mouth daily.  [name] warfarin (COUMADIN) 5 MG tablet Take 5-10 mg by mouth See admin instructions. Take 10 mg by mouth on Sunday and Thursday, all remaining days  take 5 mg daily   Current Facility-Administered Medications for the 03/15/20 encounter (Office Visit) with Lorretta Harp, MD  Medication  . sodium chloride flush (NS) 0.9 % injection 3 mL     No Known Allergies  Social History   Socioeconomic History  . Marital status: Divorced    Spouse name: Not on file  . Number of children: 3  . Years of education: Not on file  . Highest education level: Not on file  Occupational History  . Occupation: Retired  Tobacco Use  . Smoking status: Former Smoker    Years: 30.00    Types: Cigarettes, Cigars    Quit date: 12/15/1993    Years since quitting: 26.2  . Smokeless tobacco: Never Used  . Tobacco comment:  occasional cigar  Substance and Sexual Activity  . Alcohol use: Yes    Alcohol/week: 16.0 standard drinks    Types: 1 Cans of beer, 1 Shots of liquor, 14 Standard drinks or equivalent per week    Comment: drinks 2 martini's a night (2 shots in each)  . Drug use: No  . Sexual activity: Yes  Other Topics Concern  . Not on file  Social History Narrative   Tries to remain active.  Frequent golfer but claudication limits this.   Social Determinants of Health   Financial Resource Strain:   . Difficulty of Paying Living Expenses:   Food Insecurity:   . Worried About Charity fundraiser in the Last Year:   . Arboriculturist in the Last Year:   Transportation Needs:   . Film/video editor (Medical):   [name] Lack of Transportation (Non-Medical):   Physical Activity:   . Days of Exercise per Week:   . Minutes of Exercise per Session:   Stress:   . Feeling of Stress :   Social Connections:   . Frequency of Communication with Friends and Family:   . Frequency of Social Gatherings with Friends and Family:   . Attends Religious Services:   . Active Member of Clubs or Organizations:   . Attends Archivist Meetings:   [name] Marital Status:   Intimate Partner Violence:   . Fear of Current or Ex-Partner:   . Emotionally Abused:   [name] Physically Abused:   . Sexually Abused:      Review of Systems: General: negative for chills, fever, night sweats or weight changes.  Cardiovascular: negative for chest pain, dyspnea on exertion, edema, orthopnea, palpitations, paroxysmal nocturnal dyspnea or shortness of breath Dermatological: negative for rash Respiratory: negative for cough or wheezing Urologic: negative for hematuria Abdominal: negative for nausea, vomiting, diarrhea, bright red blood per rectum, melena, or hematemesis Neurologic: negative for visual changes, syncope, or dizziness All other systems reviewed and are otherwise negative except as noted above.    Blood pressure (!)  128/58, pulse (!) 39, height 5\' 8"  (1.727 m), weight 171 lb 9.6 oz (77.8 kg).  General appearance: alert and no distress Neck: no adenopathy, no JVD, supple, symmetrical, trachea midline, thyroid not enlarged, symmetric, no tenderness/mass/nodules and Right carotid bruit Lungs: clear to auscultation bilaterally Heart: Crisp mechanical prosthetic valve sounds Extremities: extremities normal, atraumatic, no cyanosis or edema Pulses: 2+ and symmetric Skin: Skin color, texture, turgor normal. No rashes or lesions Neurologic: Alert and oriented X 3, normal strength and tone. Normal symmetric reflexes. Normal coordination and gait  EKG sinus bradycardia at 39 with inferior and lateral T wave inversion.  I personally reviewed this EKG.  This is unchanged from his prior EKG performed on 03/07/2020 except for slightly lower heart rate.  ASSESSMENT AND PLAN:   Peripheral vascular disease ([place]) History of peripheral arterial disease status post remote aortobifemoral bypass grafting, staged right SFA directional arthrectomy and stenting of his proximal and mid right SFA using antegrade approach 10/01/2016.  Because of recurrent claudication Dopplers performed 02/02/2020 revealed high-grade signal in his proximal right SFA which I had demonstrated at the time of cardiac catheterization in October of last year.  I performed PTA using chocolate balloon, drug-coated balloon as well as restenting of the origin of his right SFA for short segment CTO 02/29/2020.  He was discharged home the following day with Lovenox bridging since he was on Coumadin for his mechanical aortic valve.  His follow-up lower extremity arterial Doppler studies performed 03/07/2020 showed normal velocities within his SFA.  His claudication completely resolved.  He currently is on clopidogrel and warfarin.      Lorretta Harp MD FACP,FACC,FAHA, [facility] 03/15/2020 10:43 AM

## 2020-03-15 NOTE — Patient Instructions (Signed)
Medication Instructions:  Your physician recommends that you continue on your current medications as directed. Please refer to the Current Medication list given to you today.  *If you need a refill on your cardiac medications before your next appointment, please call your pharmacy*  Lab Work: NONE  Testing/Procedures: Your physician has requested that you have a lower extremity arterial duplex in 6 MONTHS. This test is an ultrasound of the arteries in the legs. It looks at arterial blood flow in the legs. Allow one hour for Lower Arterial scans. There are no restrictions or special instructions  Your physician has requested that you have a carotid duplex in 12 MONTHS. This test is an ultrasound of the carotid arteries in your neck. It looks at blood flow through these arteries that supply the brain with blood. Allow one hour for this exam. There are no restrictions or special instructions.  Follow-Up: At [facility], you and your health needs are our priority.  As part of our continuing mission to provide you with exceptional heart care, we have created designated Provider Care Teams.  These Care Teams include your primary Cardiologist (physician) and Advanced Practice Providers (APPs -  Physician Assistants and Nurse Practitioners) who all work together to provide you with the care you need, when you need it.  We recommend signing up for the patient portal called "MyChart".  Sign up information is provided on this After Visit Summary.  MyChart is used to connect with patients for Virtual Visits (Telemedicine).  Patients are able to view lab/test results, encounter notes, upcoming appointments, etc.  Non-urgent messages can be sent to your provider as well.   To learn more about what you can do with MyChart, go to [email or web address].    Your next appointment:   12 month(s)  The format for your next appointment:   In Person  Provider:   Quay Burow, MD

## 2020-03-15 NOTE — Assessment & Plan Note (Signed)
History of peripheral arterial disease status post remote aortobifemoral bypass grafting, staged right SFA directional arthrectomy and stenting of his proximal and mid right SFA using antegrade approach 10/01/2016.  Because of recurrent claudication Dopplers performed 02/02/2020 revealed high-grade signal in his proximal right SFA which I had demonstrated at the time of cardiac catheterization in October of last year.  I performed PTA using chocolate balloon, drug-coated balloon as well as restenting of the origin of his right SFA for short segment CTO 02/29/2020.  He was discharged home the following day with Lovenox bridging since he was on Coumadin for his mechanical aortic valve.  His follow-up lower extremity arterial Doppler studies performed 03/07/2020 showed normal velocities within his SFA.  His claudication completely resolved.  He currently is on clopidogrel and warfarin.

## 2020-03-19 NOTE — Progress Notes (Signed)
[facility] [employer] PREP Weekly Session   Patient Details  Name: [name] [name] MRN: [id number] Date of Birth: 05-09-1946 Age: 74 y.o. PCP: Shon Baton, MD  Vitals:   03/19/20 1634  Weight: 167 lb (75.8 kg)    [facility] [employer] Weekly seesion - 03/19/20 1600      Weekly Session   Topic Discussed  --   portion control   Minutes exercised this week  210 minutes    Classes attended to date  16     Grateful for: family, friends, my old cat Nutrition celebration: ate over 2 cups of fresh fruit every day Barriers/ struggles: none    Barnett Hatter 03/19/2020, 4:35 PM

## 2020-03-20 DIAGNOSIS — H33313 Horseshoe tear of retina without detachment, bilateral: Secondary | ICD-10-CM | POA: Diagnosis not present

## 2020-03-20 DIAGNOSIS — H31093 Other chorioretinal scars, bilateral: Secondary | ICD-10-CM | POA: Diagnosis not present

## 2020-03-20 DIAGNOSIS — H353112 Nonexudative age-related macular degeneration, right eye, intermediate dry stage: Secondary | ICD-10-CM | POA: Diagnosis not present

## 2020-03-25 ENCOUNTER — Other Ambulatory Visit: Payer: Self-pay

## 2020-03-25 ENCOUNTER — Ambulatory Visit (INDEPENDENT_AMBULATORY_CARE_PROVIDER_SITE_OTHER): Payer: Medicare Other | Admitting: *Deleted

## 2020-03-25 DIAGNOSIS — Z5181 Encounter for therapeutic drug level monitoring: Secondary | ICD-10-CM

## 2020-03-25 DIAGNOSIS — I359 Nonrheumatic aortic valve disorder, unspecified: Secondary | ICD-10-CM | POA: Diagnosis not present

## 2020-03-25 DIAGNOSIS — G459 Transient cerebral ischemic attack, unspecified: Secondary | ICD-10-CM

## 2020-03-25 LAB — POCT INR: INR: 4.1 — AB (ref 2.0–3.0)

## 2020-03-25 NOTE — Patient Instructions (Addendum)
Description   Do not take Warfarin tomorrow then continue taking 1 tablet (5mg ) daily except for 2 tablets (10mg ) on Sundays and Thursdays. Recheck INR in 2 weeks. Coumadin Clinic for any changes in medications or upcoming procedures. [phone]

## 2020-03-26 NOTE — Progress Notes (Signed)
[facility] [employer] PREP Weekly Session   Patient Details  Name: [name] [name] MRN: [id number] Date of Birth: 07/03/1946 Age: 75 y.o. PCP: Shon Baton, MD  Vitals:   03/26/20 1608  Weight: 167 lb (75.8 kg)    [facility] [employer] Weekly seesion - 03/26/20 1600      Weekly Session   Topic Discussed  Finding support    Minutes exercised this week  540 minutes    Classes attended to date  [place] 03/26/2020, 4:10 PM

## 2020-04-01 ENCOUNTER — Other Ambulatory Visit (HOSPITAL_COMMUNITY): Payer: Self-pay | Admitting: Adult Health

## 2020-04-02 NOTE — Progress Notes (Signed)
[facility] [employer] PRE[facility] Weekly Session   Patient Details  Name: [name] [name] MRN: [id number] Date of Birth: October 12, 1946 Age: 74 y.o. PCP: Shon Baton, MD  Vitals:   04/02/20 1543  Weight: 166 lb (75.3 kg)    [employer] [employer] Weekly seesion - 04/02/20 1500      Weekly Session   Topic Discussed  Calorie breakdown   clean 58, dirty dozen handout   Minutes exercised this week  290 minutes    Classes attended to date  66      Grateful for: family, life, and old cat Nutrition celebration: made sugar free banana bread Barriers/struggles: none.  Barnett Hatter 04/02/2020, 3:44 PM

## 2020-04-11 ENCOUNTER — Other Ambulatory Visit: Payer: Self-pay

## 2020-04-11 ENCOUNTER — Ambulatory Visit (INDEPENDENT_AMBULATORY_CARE_PROVIDER_SITE_OTHER): Payer: Medicare Other | Admitting: *Deleted

## 2020-04-11 DIAGNOSIS — G459 Transient cerebral ischemic attack, unspecified: Secondary | ICD-10-CM

## 2020-04-11 DIAGNOSIS — I359 Nonrheumatic aortic valve disorder, unspecified: Secondary | ICD-10-CM | POA: Diagnosis not present

## 2020-04-11 DIAGNOSIS — Z5181 Encounter for therapeutic drug level monitoring: Secondary | ICD-10-CM | POA: Diagnosis not present

## 2020-04-11 LAB — POCT INR: INR: 3.9 — AB (ref 2.0–3.0)

## 2020-04-11 NOTE — Patient Instructions (Signed)
Description   Do not take Warfarin tomorrow then start taking 1 tablet (5mg ) daily except for 2 tablets (10mg ) on Thursdays. Recheck INR in 2 weeks. Coumadin Clinic for any changes in medications or upcoming procedures. [phone]

## 2020-04-16 NOTE — Progress Notes (Signed)
[facility] [employer] PREP Weekly Session   Patient Details  Name: [name] [name] MRN: [id number] Date of Birth: [birth year] Age: 74 y.o. PCP: Shon Baton, MD  Vitals:   04/16/20 1627  Weight: 166 lb (75.3 kg)    [employer] [employer] Weekly seesion - 04/16/20 1600      Weekly Session   Topic Discussed  --   goal setting   Minutes exercised this week  380 minutes    Classes attended to date  47     Fun things since last meeting: new counter tops installed. Nutrition celebration: 2 cups of fresh fruit daily Barriers/ struggles: ate breakfast at [place] 04/16/2020, 4:28 PM

## 2020-04-22 ENCOUNTER — Telehealth (HOSPITAL_COMMUNITY): Payer: Self-pay | Admitting: *Deleted

## 2020-04-22 NOTE — Telephone Encounter (Signed)
Pt called stating his heart rate has been in the 40's (45 today) and he has been dizzy and light headed. Pts bp is stable 120s/60s.  Routed to [place] for advice

## 2020-04-22 NOTE — Telephone Encounter (Signed)
Stop Coreg for now.  Come in for ECG

## 2020-04-23 NOTE — Telephone Encounter (Signed)
Pt returned my call he is aware of medication change and ekg scheduled for tomorrow.

## 2020-04-23 NOTE — Telephone Encounter (Signed)
Left Vm for pt to return call

## 2020-04-24 ENCOUNTER — Ambulatory Visit (HOSPITAL_COMMUNITY)
Admission: RE | Admit: 2020-04-24 | Discharge: 2020-04-24 | Disposition: A | Discharge status: Home / Self Care | Payer: Medicare Other | Source: Ambulatory Visit | Attending: Internal Medicine | Admitting: Internal Medicine

## 2020-04-24 ENCOUNTER — Other Ambulatory Visit: Payer: Self-pay

## 2020-04-24 VITALS — BP 130/72 | HR 60 | Resp 16

## 2020-04-24 DIAGNOSIS — I480 Paroxysmal atrial fibrillation: Secondary | ICD-10-CM | POA: Diagnosis not present

## 2020-04-24 NOTE — Progress Notes (Signed)
Pt in for EKG today, VS stable, EKG obtained and reviewed by Dr Aundra Dubin, he advised SR HR 58, pt should remain off of Carvedilol for now and continue to monitor HR. Pt is aware, agreeable and verbalizes understanding.

## 2020-04-24 NOTE — Patient Instructions (Signed)
Stay off the Carvedilol  Continue to monitor your HR

## 2020-04-25 ENCOUNTER — Other Ambulatory Visit: Payer: Self-pay | Admitting: Gastroenterology

## 2020-04-25 ENCOUNTER — Ambulatory Visit (INDEPENDENT_AMBULATORY_CARE_PROVIDER_SITE_OTHER): Payer: Medicare Other

## 2020-04-25 ENCOUNTER — Ambulatory Visit (INDEPENDENT_AMBULATORY_CARE_PROVIDER_SITE_OTHER): Payer: Medicare Other | Admitting: Podiatry

## 2020-04-25 DIAGNOSIS — Z5181 Encounter for therapeutic drug level monitoring: Secondary | ICD-10-CM

## 2020-04-25 DIAGNOSIS — R234 Changes in skin texture: Secondary | ICD-10-CM

## 2020-04-25 DIAGNOSIS — G459 Transient cerebral ischemic attack, unspecified: Secondary | ICD-10-CM | POA: Diagnosis not present

## 2020-04-25 DIAGNOSIS — I359 Nonrheumatic aortic valve disorder, unspecified: Secondary | ICD-10-CM

## 2020-04-25 LAB — POCT INR: INR: 4.3 — AB (ref 2.0–3.0)

## 2020-04-25 NOTE — Patient Instructions (Signed)
Description   Do not take Warfarin tomorrow then start taking 1 tablet (5mg ) daily.  Recheck INR in 2 weeks. Coumadin Clinic for any changes in medications or upcoming procedures. [phone]

## 2020-05-07 DIAGNOSIS — L570 Actinic keratosis: Secondary | ICD-10-CM | POA: Diagnosis not present

## 2020-05-07 DIAGNOSIS — D044 Carcinoma in situ of skin of scalp and neck: Secondary | ICD-10-CM | POA: Diagnosis not present

## 2020-05-11 ENCOUNTER — Other Ambulatory Visit (HOSPITAL_COMMUNITY): Payer: Self-pay | Admitting: Cardiology

## 2020-05-14 NOTE — Progress Notes (Signed)
[facility] [place] Report   Patient Details  Name: [name] [name] MRN: [id number] Date of Birth: [birth year] Age: 74 y.o. PCP: Shon Baton, MD  Vitals:   04/18/20 1057  BP: (!) 112/50  Pulse: (!) 45  Weight: 166 lb (75.3 kg)     [facility] Eval - 05/14/20 1000      Measurement   Neck measurement  16.5 Inches    Waist Circumference  39.5 inches    Body fat  27.3 percent      Information for Trainer   Goals  60 min of exercise daily      Past Medical History:  Diagnosis Date  . Adenomatous colon polyp 09/1997  . Anemia   . Aortic stenosis    s/p [facility] mechanical AVR - Chronic Coumadin  . Blood transfusion    "related to ITP"  . Coronary artery disease    s/p cabg x 3 11/2003: lima-lad, seq vg to rpda and rpl  . Diverticulitis of colon   . Heart murmur   . Hyperlipidemia   . Hypertension   . ITP (idiopathic thrombocytopenic purpura)   . Peripheral arterial disease ([place])    a. history of aortobifemoral bypass grafting by Dr. Sherren Mocha early b. LE angiography 04/22/2015 patent aortobifem graft, DES to R SFA  . Peripheral vascular disease ([place])    s/p Left external Iliac Artery stenting and subsequent left femoral endarterectomy 02/2011- post op course complicated by wound infxn req I&D 03/2011  . Renal artery stenosis, native, bilateral (HCC)    a. bilateral renal artery stenosis by recent duplex ultrasound b. L renal artery stent 02/2015, R renal artery patent on angiogram  . Stroke ([place])   . TIA (transient ischemic attack) ~ 2013  . Type II diabetes mellitus ([place])    Past Surgical History:  Procedure Laterality Date  . ABDOMINAL AORTAGRAM N/A 12/16/2011   Procedure: ABDOMINAL Maxcine Ham;  Surgeon: Sherren Mocha, MD;  Location: [facility] CATH LAB;  Service: Cardiovascular;  Laterality: N/A;  . ABDOMINAL AORTOGRAM W/LOWER EXTREMITY Right 02/29/2020   Procedure: ABDOMINAL AORTOGRAM W/LOWER EXTREMITY;  Surgeon: Lorretta Harp, MD;  Location: [place] CV LAB;   Service: Cardiovascular;  Laterality: Right;  . ANGIOPLASTY / STENTING ILIAC     Left external Iliac Artery  . AORTA - BILATERAL FEMORAL ARTERY BYPASS GRAFT  01/18/2012   Procedure: AORTA BIFEMORAL BYPASS GRAFT;  Surgeon: Curt Jews, MD;  Location: [place];  Service: Vascular;  Laterality: N/A;  . AORTIC VALVE REPLACEMENT  ~ 2004  . CARDIAC CATHETERIZATION  11/2003   /pt report 10/01/2016  . CARDIAC VALVE REPLACEMENT  11/2003   aortic  . CARDIOVERSION N/A 10/16/2019   Procedure: CARDIOVERSION;  Surgeon: Larey Dresser, MD;  Location: [facility] ENDOSCOPY;  Service: Cardiovascular;  Laterality: N/A;  . CATARACT EXTRACTION W/ INTRAOCULAR LENS  IMPLANT, BILATERAL Bilateral   . COLONOSCOPY    . COLONOSCOPY WITH PROPOFOL N/A 09/03/2019   Procedure: COLONOSCOPY WITH PROPOFOL;  Surgeon: Doran Stabler, MD;  Location: [facility] ENDOSCOPY;  Service: Gastroenterology;  Laterality: N/A;  . CORONARY ARTERY BYPASS GRAFT  11/2003   Archie Endo 04/21/2011  . CORONARY ATHERECTOMY N/A 10/10/2019   Procedure: CORONARY ATHERECTOMY;  Surgeon: Sherren Mocha, MD;  Location: [place] CV LAB;  Service: Cardiovascular;  Laterality: N/A;  . CORONARY STENT INTERVENTION N/A 10/10/2019   Procedure: CORONARY STENT INTERVENTION;  Surgeon: Sherren Mocha, MD;  Location: [place] CV LAB;  Service: Cardiovascular;  Laterality: N/A;  . CORONARY/GRAFT  ANGIOGRAPHY N/A 09/18/2019   Procedure: CORONARY/GRAFT ANGIOGRAPHY;  Surgeon: Lorretta Harp, MD;  Location: [place] CV LAB;  Service: Cardiovascular;  Laterality: N/A;  . ESOPHAGOGASTRODUODENOSCOPY (EGD) WITH PROPOFOL N/A 02/27/2019   Procedure: ESOPHAGOGASTRODUODENOSCOPY (EGD) WITH PROPOFOL;  Surgeon: Doran Stabler, MD;  Location: [place];  Service: Endoscopy;  Laterality: N/A;  . HEMOSTASIS CLIP PLACEMENT  09/03/2019   Procedure: HEMOSTASIS CLIP PLACEMENT;  Surgeon: Doran Stabler, MD;  Location: [facility] ENDOSCOPY;  Service: Gastroenterology;;  . LOWER EXTREMITY ANGIOGRAM  N/A 02/21/2015   Procedure: LOWER EXTREMITY ANGIOGRAM;  Surgeon: Lorretta Harp, MD;  Location: [facility] CATH LAB;  Service: Cardiovascular;  Laterality: N/A;  . PERIPHERAL VASCULAR CATHETERIZATION N/A 04/22/2015   Procedure: Lower Extremity Angiography;  Surgeon: Lorretta Harp, MD;  Location: [place] CV LAB;  Service: Cardiovascular;  Laterality: N/A;  . PERIPHERAL VASCULAR CATHETERIZATION N/A 08/24/2016   Procedure: Lower Extremity Angiography;  Surgeon: Lorretta Harp, MD;  Location: [place] CV LAB;  Service: Cardiovascular;  Laterality: N/A;  . PERIPHERAL VASCULAR CATHETERIZATION Right 10/01/2016   Procedure: Peripheral Vascular Intervention - STENT;  Surgeon: Lorretta Harp, MD;  Location: [place] CV LAB;  Service: Cardiovascular;  Laterality: Right;  Prox and MID SFA   . PERIPHERAL VASCULAR INTERVENTION  02/29/2020   Procedure: PERIPHERAL VASCULAR INTERVENTION;  Surgeon: Lorretta Harp, MD;  Location: [place] CV LAB;  Service: Cardiovascular;;  Right SFA  . POLYPECTOMY    . RENAL ANGIOGRAM N/A 02/21/2015   Procedure: RENAL ANGIOGRAM;  Surgeon: Lorretta Harp, MD;  Location: [facility] CATH LAB;  Service: Cardiovascular;  Laterality: Bilateral; 6 mm x 12 mm long Herculink balloon expandable stent to the left renal artery  . RENAL ARTERY STENT Left 04/22/2015   dr berry  . SPLENECTOMY  02/2003   Archie Endo 04/21/2011  . TEE WITHOUT CARDIOVERSION N/A 10/16/2019   Procedure: TRANSESOPHAGEAL ECHOCARDIOGRAM (TEE);  Surgeon: Larey Dresser, MD;  Location: [facility] ENDOSCOPY;  Service: Cardiovascular;  Laterality: N/A;  . TEMPORARY PACEMAKER N/A 10/10/2019   Procedure: TEMPORARY PACEMAKER;  Surgeon: Sherren Mocha, MD;  Location: [place] CV LAB;  Service: Cardiovascular;  Laterality: N/A;  . TONSILLECTOMY  ~ 1952   Social History   Tobacco Use  Smoking Status Former Smoker  . Years: 30.00  . Types: Cigarettes, Cigars  . Quit date: 12/15/1993  . Years since quitting: 26.4  Smokeless  Tobacco Never Used  Tobacco Comment   occasional cigar     PREP Program completed 04/18/20  Barnett Hatter 05/14/2020, 10:59 AM

## 2020-05-21 ENCOUNTER — Ambulatory Visit (INDEPENDENT_AMBULATORY_CARE_PROVIDER_SITE_OTHER): Payer: Medicare Other | Admitting: *Deleted

## 2020-05-21 ENCOUNTER — Other Ambulatory Visit (HOSPITAL_COMMUNITY): Payer: Self-pay | Admitting: Cardiology

## 2020-05-21 ENCOUNTER — Other Ambulatory Visit: Payer: Self-pay

## 2020-05-21 DIAGNOSIS — Z5181 Encounter for therapeutic drug level monitoring: Secondary | ICD-10-CM | POA: Diagnosis not present

## 2020-05-21 LAB — POCT INR: INR: 2.8 (ref 2.0–3.0)

## 2020-05-21 NOTE — Patient Instructions (Signed)
Description   Take 1.5 tablets today, then continue taking 1 tablet (5mg ) daily.  Recheck INR in 2 weeks. Coumadin Clinic for any changes in medications or upcoming procedures. [phone]

## 2020-05-30 ENCOUNTER — Other Ambulatory Visit: Payer: Self-pay

## 2020-05-30 ENCOUNTER — Encounter (HOSPITAL_COMMUNITY): Payer: Self-pay | Admitting: Cardiology

## 2020-05-30 ENCOUNTER — Ambulatory Visit (HOSPITAL_BASED_OUTPATIENT_CLINIC_OR_DEPARTMENT_OTHER)
Admission: RE | Admit: 2020-05-30 | Discharge: 2020-05-30 | Disposition: A | Discharge status: Home / Self Care | Payer: Medicare Other | Source: Ambulatory Visit | Attending: Cardiology | Admitting: Cardiology

## 2020-05-30 ENCOUNTER — Ambulatory Visit (HOSPITAL_COMMUNITY)
Admission: RE | Admit: 2020-05-30 | Discharge: 2020-05-30 | Disposition: A | Discharge status: Home / Self Care | Payer: Medicare Other | Source: Ambulatory Visit | Attending: Internal Medicine | Admitting: Internal Medicine

## 2020-05-30 VITALS — BP 140/80 | HR 63 | Wt 172.8 lb

## 2020-05-30 DIAGNOSIS — I255 Ischemic cardiomyopathy: Secondary | ICD-10-CM | POA: Diagnosis not present

## 2020-05-30 DIAGNOSIS — Z7902 Long term (current) use of antithrombotics/antiplatelets: Secondary | ICD-10-CM | POA: Diagnosis not present

## 2020-05-30 DIAGNOSIS — E785 Hyperlipidemia, unspecified: Secondary | ICD-10-CM | POA: Insufficient documentation

## 2020-05-30 DIAGNOSIS — I5031 Acute diastolic (congestive) heart failure: Secondary | ICD-10-CM | POA: Diagnosis not present

## 2020-05-30 DIAGNOSIS — I472 Ventricular tachycardia: Secondary | ICD-10-CM | POA: Diagnosis not present

## 2020-05-30 DIAGNOSIS — E119 Type 2 diabetes mellitus without complications: Secondary | ICD-10-CM | POA: Insufficient documentation

## 2020-05-30 DIAGNOSIS — I359 Nonrheumatic aortic valve disorder, unspecified: Secondary | ICD-10-CM

## 2020-05-30 DIAGNOSIS — Z7901 Long term (current) use of anticoagulants: Secondary | ICD-10-CM | POA: Diagnosis not present

## 2020-05-30 DIAGNOSIS — I2510 Atherosclerotic heart disease of native coronary artery without angina pectoris: Secondary | ICD-10-CM | POA: Diagnosis not present

## 2020-05-30 DIAGNOSIS — F17290 Nicotine dependence, other tobacco product, uncomplicated: Secondary | ICD-10-CM | POA: Insufficient documentation

## 2020-05-30 DIAGNOSIS — Z8249 Family history of ischemic heart disease and other diseases of the circulatory system: Secondary | ICD-10-CM | POA: Diagnosis not present

## 2020-05-30 DIAGNOSIS — I110 Hypertensive heart disease with heart failure: Secondary | ICD-10-CM | POA: Insufficient documentation

## 2020-05-30 DIAGNOSIS — I6521 Occlusion and stenosis of right carotid artery: Secondary | ICD-10-CM | POA: Diagnosis not present

## 2020-05-30 DIAGNOSIS — Z8673 Personal history of transient ischemic attack (TIA), and cerebral infarction without residual deficits: Secondary | ICD-10-CM | POA: Diagnosis not present

## 2020-05-30 DIAGNOSIS — I5022 Chronic systolic (congestive) heart failure: Secondary | ICD-10-CM

## 2020-05-30 DIAGNOSIS — Z7984 Long term (current) use of oral hypoglycemic drugs: Secondary | ICD-10-CM | POA: Insufficient documentation

## 2020-05-30 DIAGNOSIS — I480 Paroxysmal atrial fibrillation: Secondary | ICD-10-CM | POA: Diagnosis not present

## 2020-05-30 DIAGNOSIS — Z955 Presence of coronary angioplasty implant and graft: Secondary | ICD-10-CM | POA: Insufficient documentation

## 2020-05-30 DIAGNOSIS — K922 Gastrointestinal hemorrhage, unspecified: Secondary | ICD-10-CM | POA: Insufficient documentation

## 2020-05-30 DIAGNOSIS — Z833 Family history of diabetes mellitus: Secondary | ICD-10-CM | POA: Diagnosis not present

## 2020-05-30 DIAGNOSIS — I739 Peripheral vascular disease, unspecified: Secondary | ICD-10-CM | POA: Insufficient documentation

## 2020-05-30 DIAGNOSIS — Z79899 Other long term (current) drug therapy: Secondary | ICD-10-CM | POA: Insufficient documentation

## 2020-05-30 DIAGNOSIS — Z951 Presence of aortocoronary bypass graft: Secondary | ICD-10-CM | POA: Diagnosis not present

## 2020-05-30 DIAGNOSIS — Z952 Presence of prosthetic heart valve: Secondary | ICD-10-CM | POA: Diagnosis not present

## 2020-05-30 DIAGNOSIS — I701 Atherosclerosis of renal artery: Secondary | ICD-10-CM | POA: Insufficient documentation

## 2020-05-30 DIAGNOSIS — I252 Old myocardial infarction: Secondary | ICD-10-CM | POA: Diagnosis not present

## 2020-05-30 LAB — CBC
HCT: 50.7 % (ref 39.0–52.0)
Hemoglobin: 16.2 g/dL (ref 13.0–17.0)
MCH: 31.3 pg (ref 26.0–34.0)
MCHC: 32 g/dL (ref 30.0–36.0)
MCV: 97.9 fL (ref 80.0–100.0)
Platelets: 232 10*3/uL (ref 150–400)
RBC: 5.18 MIL/uL (ref 4.22–5.81)
RDW: 15.5 % (ref 11.5–15.5)
WBC: 7 10*3/uL (ref 4.0–10.5)
nRBC: 0 % (ref 0.0–0.2)

## 2020-05-30 LAB — COMPREHENSIVE METABOLIC PANEL WITH GFR
ALT: 19 U/L (ref 0–44)
AST: 26 U/L (ref 15–41)
Albumin: 3.9 g/dL (ref 3.5–5.0)
Alkaline Phosphatase: 64 U/L (ref 38–126)
Anion gap: 11 (ref 5–15)
BUN: 26 mg/dL — ABNORMAL HIGH (ref 8–23)
CO2: 23 mmol/L (ref 22–32)
Calcium: 8.9 mg/dL (ref 8.9–10.3)
Chloride: 106 mmol/L (ref 98–111)
Creatinine, Ser: 1.16 mg/dL (ref 0.61–1.24)
GFR calc Af Amer: >60 mL/min
GFR calc non Af Amer: >60 mL/min
Glucose, Bld: 108 mg/dL — ABNORMAL HIGH (ref 70–99)
Potassium: 4.6 mmol/L (ref 3.5–5.1)
Sodium: 140 mmol/L (ref 135–145)
Total Bilirubin: 1.1 mg/dL (ref 0.3–1.2)
Total Protein: 6.9 g/dL (ref 6.5–8.1)

## 2020-05-30 LAB — LIPID PANEL
Cholesterol: 154 mg/dL (ref 0–200)
HDL: 101 mg/dL (ref >40)
LDL Cholesterol: 44 mg/dL (ref 0–99)
Total CHOL/HDL Ratio: 1.5 RATIO
Triglycerides: 47 mg/dL (ref <150)
VLDL: 9 mg/dL (ref 0–40)

## 2020-05-30 LAB — TSH: TSH: 1.313 u[IU]/mL (ref 0.350–4.500)

## 2020-05-30 MED ORDER — FUROSEMIDE 40 MG PO TABS: 20.0000 mg | ORAL_TABLET | Freq: Every day | ORAL | Status: DC

## 2020-05-30 NOTE — Progress Notes (Signed)
PCP: Shon Baton, MD HF Cardiology: Dr. Velora Heckler a 74 y.o.malewith a history of CAD s/p CABG, mechanical AVR on coumadin, extensive PAD, DM, HTN, HLD, ITP, TIA, and recurrent GI bleed. Note, requires lovenox bridging off heparin.  Dr. Gwenlyn Found follows him for extensive PAD. He has had an aortobifem bypass in 2016 with follow up iliac stenting and femoral endarterectomy. In 2017, he had subsequent ostial and mid right SFA intervention. Hx of left renal artery stenting in 2016 with repeat intervention on left for 75% in-stent restenosis, with progression of disease on the right renal artery showing 60% stenosis on duplex 07/12/19. Echo 09/17/19 with normal EF of 60-65% and normal AVR function; chronic diastolic heart failure.   Admitted in 10/20 with chest pain. He underwent heart cath 09/18/19 that showed 99% left main stenosis and patent LIMA to occluded LAD and a patent sequential vein to the PDA and PLA of an occluded dominant right. His left main stenosis jeopardized the moderately large ramus and high first marginal and nondominant Cx which has 90% mid AV groove Cx stenosis. Aggressive medical therapy was recommended initially.  He was readmitted in 10/20 with CHF and chest pain. Hospital course complicated by A flutter/low output heart failure.  S/P successful orbital atherectomy, PTCA, and stenting of protected left main with a 4.0x15 mm resolute onyx DES 11/3. LIMA-LAD patent. SVG-PDA patent. ECHO this admission showed EF 30-35%, mechanical AoV ok. TEE 11/9 w/ improved EF, 40-45%. Placed on milrinone initially with low co-ox and later weaned off. He was cardioverted from atrial flutter back to NSR.   2/21 peripheral arterial dopplers showed 75-99% stenosis right SFA.  In 3/21, he had stenting to right SFA.    Echo was done today and reviewed, EF up to 55-60%.    He returns today for CHF followup.  He is in NSR, no palpitations. He is playing golf twice a week and goes to  the [facility] on other days.  No significant exertional dyspnea.  No chest pain.  No orthopnea/PND.  No claudication.      ECG (personally reviewed): NSR, nonspecific T wave flattening.   Labs (11/20): K 4.3, creatinine 1.3, hgb 12.5 Labs (12/20): LDL 56, HDL 80, TGs 179 Labs (1/21): K 4.8, creatinine 1.45 Labs (4/21): K 5.2, creatinine 1.34  PMH  1. CAD:  S/P CABG (3/04) with LIMA-LAD, seq SVG-PDA/PLV.  - NSTEMI 10/20 with cath showing patent LIMA-LAD and SVG-PDA/PLV but 99% LM stenosis, occluded LAD, moderate ramus and high OM1 jeopardized, also 90% mid AV groove LCx stenosis.  Occluded RCA.  Patient ultimately had orbital atherectomy and stenting of left main with a 4.0 x 15 mm Resolute Onyx DES.  2. Mechanical aortic valve: TEE 11/20 showed stable-appearing mechanical valve, mean gradient 9 mmHg.  3. PAD: Aortobifemoral bypass in 2016 with follow up iliac stenting and femoral endarterectomy. In 2017, he had subsequent ostial and mid right SFA intervention. - Angiography 10/20 with 80% in-stent restenosis in proximal right SFA.  - Peripheral arterial dopplers (2/21) with 75-99% R SFA stenosis in prior stented area.  - 3/21 Right SFA stent (Dr. Gwenlyn Found).  4. Type 2 diabetes.  5. HTN 6. Hyperlipidemia 7. H/o ITP  8. TIA  9. Chronic Systolic HF: Ischemic cardiomyopathy.  - Echo (10/20): EF 30-35%, mechanical AoV ok.  - TEE (11/20): w/ improved EF, 40-45%. Mechanical aortic valve with mean gradient 9 mmHg, normal RV.  - Echo (6/21): EF 55-60%, mild LVH, mechanical AoV with mean gradient  9 mmHg.  10. Atrial fibrillation: Paroxysmal.  11. Renal artery stenosis: Hx of left renal artery stenting in 2016 with repeat intervention on left for 75% in-stent restenosis, with progression of disease on the right renal artery showing 60% stenosis on duplex 07/12/19. 12. Carotid stenosis: 4/21 carotid dopplers with right subclavian stenosis, 40-59% RICA stenosis.   ROS: All systems negative except as listed  in HPI, PMH and Problem List.  Social History   Socioeconomic History  . Marital status: Divorced    Spouse name: Not on file  . Number of children: 3  . Years of education: Not on file  . Highest education level: Not on file  Occupational History  . Occupation: Retired  Tobacco Use  . Smoking status: Former Smoker    Years: 30.00    Types: Cigarettes, Cigars    Quit date: 12/15/1993    Years since quitting: 26.4  . Smokeless tobacco: Never Used  . Tobacco comment: occasional cigar  Vaping Use  . Vaping Use: Never used  Substance and Sexual Activity  . Alcohol use: Yes    Alcohol/week: 16.0 standard drinks    Types: 1 Cans of beer, 1 Shots of liquor, 14 Standard drinks or equivalent per week    Comment: drinks 2 martini's a night (2 shots in each)  . Drug use: No  . Sexual activity: Yes  Other Topics Concern  . Not on file  Social History Narrative   Tries to remain active.  Frequent golfer but claudication limits this.   Social Determinants of Health   Financial Resource Strain:   . Difficulty of Paying Living Expenses:   Food Insecurity:   . Worried About Charity fundraiser in the Last Year:   . Arboriculturist in the Last Year:   Transportation Needs:   . Film/video editor (Medical):   [name] [name] Lack of Transportation (Non-Medical):   Physical Activity:   . Days of Exercise per Week:   . Minutes of Exercise per Session:   Stress:   . Feeling of Stress :   Social Connections:   . Frequency of Communication with Friends and Family:   . Frequency of Social Gatherings with Friends and Family:   . Attends Religious Services:   . Active Member of Clubs or Organizations:   . Attends Archivist Meetings:   [name] [name] Marital Status:   Intimate Partner Violence:   . Fear of Current or Ex-Partner:   . Emotionally Abused:   [name] [name] Physically Abused:   . Sexually Abused:    Family History  Problem Relation Age of Onset  . Coronary artery disease Mother        bypass  surgery - deceased  . Heart disease Father        murmur, valve replacement - deceased  . Breast cancer Sister   . Diabetes Other        grandmother  . Diabetes Paternal Grandmother   . Diabetes Paternal Aunt   . Colon cancer Neg Hx   . Colon polyps Neg Hx   . Esophageal cancer Neg Hx   . Rectal cancer Neg Hx   . Stomach cancer Neg Hx     Current Outpatient Medications  Medication Sig Dispense Refill  . amiodarone (PACERONE) 100 MG tablet Take 1 tablet (100 mg total) by mouth daily. 90 tablet 1  . amoxicillin (AMOXIL) 500 MG capsule Take 2,000 mg by mouth See admin instructions. Take 2000 mg by mouth 30-60 minutes  before appointment    . atorvastatin (LIPITOR) 80 MG tablet Take 80 mg by mouth daily.     . clopidogrel (PLAVIX) 75 MG tablet TAKE 1 TABLET BY MOUTH EVERY DAY 90 tablet 3  . enoxaparin (LOVENOX) 120 MG/0.8ML injection Inject 0.8 mLs (120 mg total) into the skin daily. 8 mL 1  . ENTRESTO 24-26 MG TAKE 1 TABLET BY MOUTH TWICE A DAY 60 tablet 5  . Evolocumab (REPATHA SURECLICK) 950 MG/ML SOAJ Inject 140 mg into the skin every 14 (fourteen) days.    . furosemide (LASIX) 40 MG tablet Take 0.5 tablets (20 mg total) by mouth daily. 30 tablet   . glyBURIDE (DIABETA) 2.5 MG tablet Take 2.5 mg by mouth 2 (two) times daily with a meal.     . INVOKANA 100 MG TABS tablet Take 100 mg by mouth daily.    . nitroGLYCERIN (NITROSTAT) 0.4 MG SL tablet Place 1 tablet (0.4 mg total) under the tongue every 5 (five) minutes x 3 doses as needed for chest pain. 25 tablet 12  . pantoprazole (PROTONIX) 40 MG tablet TAKE 1 TABLET BY MOUTH EVERY DAY 90 tablet 0  . spironolactone (ALDACTONE) 25 MG tablet TAKE 1 TABLET BY MOUTH EVERY DAY 90 tablet 3  . warfarin (COUMADIN) 5 MG tablet Take 5-10 mg by mouth See admin instructions. Take 10 mg by mouth on Sunday and Thursday, all remaining days take 5 mg daily     Current Facility-Administered Medications  Medication Dose Route Frequency Provider Last Rate  Last Admin  . sodium chloride flush (NS) 0.9 % injection 3 mL  3 mL Intravenous Q12H Lorretta Harp, MD        Vitals:   05/30/20 1148  BP: 140/80  Pulse: 63  SpO2: 97%  Weight: 78.4 kg (172 lb 12.8 oz)   Wt Readings from Last 3 Encounters:  05/30/20 78.4 kg (172 lb 12.8 oz)  04/18/20 75.3 kg (166 lb)  04/16/20 75.3 kg (166 lb)    PHYSICAL EXAM: General: NAD Neck: No JVD, no thyromegaly or thyroid nodule. Right bruit.  Lungs: Clear to auscultation bilaterally with normal respiratory effort. CV: Nondisplaced PMI.  Heart regular S1/S2 with mechanical S2, no S3/S4, 1/6 early SEM RUSB.  No peripheral edema.  No carotid bruit.  Difficult to palpate pedal pulses.  Abdomen: Soft, nontender, no hepatosplenomegaly, no distention.  Skin: Intact without lesions or rashes.  Neurologic: Alert and oriented x 3.  Psych: Normal affect. Extremities: No clubbing or cyanosis.  HEENT: Normal.    ASSESSMENT & PLAN: 1. Chronic Systolic CHF: Ischemic cardiomyopathy. Echo 10/06/19 showed EF 30-35% and wall motion abnormalities, prior echo earlier in 10/20 showed EF 50-55%. Cath 10/20 showed jeopardized ramus and LCx territory (99% distal left main, occluded LAD, patent LIMA-LAD). Concerned that ischemia in this territory triggered CHF, worsening of LV function.  Now s/p DES to left main, TEE in 11/20 after intervention showed EF higher at 40-45%.  Echo was done today and reviewed, EF up to 55-60%.  NYHA class I symptoms, not volume overloaded.  - Continue Entresto 24/26 bid.  BMET today.   - Decrease Lasix to 20 mg daily and stop KCl supplement.   - Continue spironolactone.  - Stop digoxin.   2. CAD: Complicated disease, coronary angiography 10/20 showed patent SVG-RCA territory and patent LIMA-LAD. The proximal LAD was occluded and there was 99% distal left main stenosis. This left the ramus and LCx in jeopardy. He was not a good candidate for  protected PCI =>cannot place Impella with mechanical  aortic valve and peripheral vascular disease likely precludes a femoral IABP. It was decided to try to manage him medically. However, he returned with progressive chest pain episodes and NSTEMI, hs-TnI up to 935. S/p successful orbital atherectomy and stenting of the left main with a 4.0x15 mm Resolute Onyx DES 10/10/19.No chest pain.   - Off ASA, continue Plavix for 1 year (stop after 11/21 and restart ASA 81).  - Continue warfarin for mechanical valve.    - Continue atorvastatin 80 daily + Repatha.  Good LDL in 12/20.  3. Mechanical aortic valve: Stable on 6/21 echo. Crisp mechanical sounds on exam - Off ASA given Plavix use.  - Continue warfarin and will aim for INR 2.5-3 while on Plavix.  4. NSVT: Noted 10/20 admission, on amiodarone.  5. H/o GI bleeding: Continue PPI for GI protection. 6. PAD: Patient with aorto-bifemoral bypass, h/o iliac stenting, h/o femoral endarterectomy, PCI to right SFA in 2017.  Angiography in 10/20 with 80% ISR in right SFA.  He had PCI to right SFA in 3/21, no claudication now.  7. H/o renal artery stenosis.  8. DM2: He is on SGLT2 inhibitor (canagliflozin).  10. Atrial fibrillation: Paroxysmal.  TEE-guided DCCV in 11/20, he remains in NSR today. Atrial fibrillation was tolerated poorly with worsening CHF.  - Continue amiodarone 100 mg daily. Check LFTs, TSH today.  He will need a regular eye exam.   - Continue coumadin.  - Refer to EP, would like consideration of atrial fibrillation ablation.  If he is ablated, could most likely stop amiodarone.  11. Carotid/right subclavian stenosis: Will repeat study in 4/22.   Followup 11/21.   Loralie Champagne  05/30/2020

## 2020-05-30 NOTE — Progress Notes (Signed)
  Echocardiogram 2D Echocardiogram has been performed.  [name] [name] 05/30/2020, 10:59 AM

## 2020-05-30 NOTE — Patient Instructions (Signed)
Stop Digoxin  Stop Potassium  Decrease Furosemide to 20 mg Daily  Labs done today, your results will be available in MyChart, we will contact you for abnormal readings.  You have been referred to Dr Rayann Heman or Dr Curt Bears for an a-fib ablation  Please call our office in October to schedule your follow up appointment  If you have any questions or concerns before your next appointment please send [place] a message through [facility] or call our office at [phone].    TO LEAVE A MESSAGE FOR THE NURSE SELECT OPTION 2, PLEASE LEAVE A MESSAGE INCLUDING: . YOUR NAME . DATE OF BIRTH . CALL BACK NUMBER . REASON FOR CALL**this is important as we prioritize the call backs  [place] AS LONG AS YOU CALL BEFORE 4:00 PM  At the [facility], you and your health needs are our priority. As part of our continuing mission to provide you with exceptional heart care, we have created designated Provider Care Teams. These Care Teams include your primary Cardiologist (physician) and Advanced Practice Providers (APPs- Physician Assistants and Nurse Practitioners) who all work together to provide you with the care you need, when you need it.   You may see any of the following providers on your designated Care Team at your next follow up: [name] Dr Glori Bickers . Dr Loralie Champagne . Darrick Grinder, NP . Lyda Jester, PA . Audry Riles, PharmD   Please be sure to bring in all your medications bottles to every appointment.

## 2020-06-04 ENCOUNTER — Ambulatory Visit (INDEPENDENT_AMBULATORY_CARE_PROVIDER_SITE_OTHER): Payer: Medicare Other

## 2020-06-04 ENCOUNTER — Other Ambulatory Visit: Payer: Self-pay

## 2020-06-04 DIAGNOSIS — Z5181 Encounter for therapeutic drug level monitoring: Secondary | ICD-10-CM

## 2020-06-04 DIAGNOSIS — G459 Transient cerebral ischemic attack, unspecified: Secondary | ICD-10-CM | POA: Diagnosis not present

## 2020-06-04 DIAGNOSIS — I359 Nonrheumatic aortic valve disorder, unspecified: Secondary | ICD-10-CM

## 2020-06-04 LAB — POCT INR: INR: 2.4 (ref 2.0–3.0)

## 2020-06-04 NOTE — Patient Instructions (Signed)
Take 1.5 tablets today, then continue taking 1 tablet (5mg ) daily.  Recheck INR in 3 weeks. Coumadin Clinic for any changes in medications or upcoming procedures. [phone]

## 2020-06-09 NOTE — Progress Notes (Signed)
  Subjective:  Patient ID: [name] [name], male    DOB: 1946/11/04,  MRN: [id number]  Chief Complaint  Patient presents with  . Foot Pain    pt is here for a wound check f/u, pt states that he is doing alot better since the last time he was here, pt also has no comments or concerns    74 y.o. male presents with the above complaint. History confirmed with patient.   Objective:  Physical Exam: warm, good capillary refill, no trophic changes or ulcerative lesions, normal DP and PT pulses and normal sensory exam. Left Foot: normal exam, no swelling, tenderness, instability; ligaments intact, full range of motion of all ankle/foot joints  Right Foot: fissured cracked heel right with pain to palpation   Assessment:   1. Skin fissure      Plan:  Patient was evaluated and treated and all questions answered.  Skin Fissure right heel -Improve -Gently debrided   -Continue urea cream / revitaderm cream  No follow-ups on file.

## 2020-06-25 DIAGNOSIS — N1831 Chronic kidney disease, stage 3a: Secondary | ICD-10-CM | POA: Diagnosis not present

## 2020-06-25 DIAGNOSIS — E1151 Type 2 diabetes mellitus with diabetic peripheral angiopathy without gangrene: Secondary | ICD-10-CM | POA: Diagnosis not present

## 2020-06-25 DIAGNOSIS — E785 Hyperlipidemia, unspecified: Secondary | ICD-10-CM | POA: Diagnosis not present

## 2020-06-25 DIAGNOSIS — I1310 Hypertensive heart and chronic kidney disease without heart failure, with stage 1 through stage 4 chronic kidney disease, or unspecified chronic kidney disease: Secondary | ICD-10-CM | POA: Diagnosis not present

## 2020-06-25 DIAGNOSIS — I739 Peripheral vascular disease, unspecified: Secondary | ICD-10-CM | POA: Diagnosis not present

## 2020-06-25 DIAGNOSIS — E1122 Type 2 diabetes mellitus with diabetic chronic kidney disease: Secondary | ICD-10-CM | POA: Diagnosis not present

## 2020-06-25 DIAGNOSIS — Z7901 Long term (current) use of anticoagulants: Secondary | ICD-10-CM | POA: Diagnosis not present

## 2020-06-25 DIAGNOSIS — I5032 Chronic diastolic (congestive) heart failure: Secondary | ICD-10-CM | POA: Diagnosis not present

## 2020-06-26 ENCOUNTER — Ambulatory Visit (INDEPENDENT_AMBULATORY_CARE_PROVIDER_SITE_OTHER): Payer: Medicare Other | Admitting: *Deleted

## 2020-06-26 ENCOUNTER — Other Ambulatory Visit: Payer: Self-pay

## 2020-06-26 DIAGNOSIS — Z5181 Encounter for therapeutic drug level monitoring: Secondary | ICD-10-CM | POA: Diagnosis not present

## 2020-06-26 LAB — POCT INR: INR: 1.8 — AB (ref 2.0–3.0)

## 2020-06-26 NOTE — Patient Instructions (Signed)
Description   Take 1.5 tablets today and tomorrow, then start taking 1 tablet (5mg ) daily except for 1.5 tablets on Wednesdays. Recheck INR in 2 weeks. Coumadin Clinic for any changes in medications or upcoming procedures. [phone]

## 2020-07-03 ENCOUNTER — Other Ambulatory Visit (HOSPITAL_COMMUNITY): Payer: Self-pay | Admitting: Adult Health

## 2020-07-03 DIAGNOSIS — H31003 Unspecified chorioretinal scars, bilateral: Secondary | ICD-10-CM | POA: Diagnosis not present

## 2020-07-03 DIAGNOSIS — H43813 Vitreous degeneration, bilateral: Secondary | ICD-10-CM | POA: Diagnosis not present

## 2020-07-03 DIAGNOSIS — H524 Presbyopia: Secondary | ICD-10-CM | POA: Diagnosis not present

## 2020-07-03 DIAGNOSIS — E119 Type 2 diabetes mellitus without complications: Secondary | ICD-10-CM | POA: Diagnosis not present

## 2020-07-09 ENCOUNTER — Encounter: Payer: Self-pay | Admitting: Cardiology

## 2020-07-09 ENCOUNTER — Other Ambulatory Visit: Payer: Self-pay

## 2020-07-09 ENCOUNTER — Ambulatory Visit (INDEPENDENT_AMBULATORY_CARE_PROVIDER_SITE_OTHER): Payer: Medicare Other | Admitting: Cardiology

## 2020-07-09 VITALS — BP 128/68 | HR 53 | Ht 68.0 in | Wt 173.0 lb

## 2020-07-09 DIAGNOSIS — I480 Paroxysmal atrial fibrillation: Secondary | ICD-10-CM | POA: Diagnosis not present

## 2020-07-09 NOTE — Patient Instructions (Addendum)
Medication Instructions:  Your physician recommends that you continue on your current medications as directed. Please refer to the Current Medication list given to you today.  Labwork: None ordered.  Testing/Procedures: None ordered.  Follow-Up: Your physician wants you to follow-up in: as needed with Dr. Curt Bears.    Any Other Special Instructions Will Be Listed Below (If Applicable).  If you need a refill on your cardiac medications before your next appointment, please call your pharmacy.    Cardiac Ablation Cardiac ablation is a procedure to disable (ablate) a small amount of heart tissue in very specific places. The heart has many electrical connections. Sometimes these connections are abnormal and can cause the heart to beat very fast or irregularly. Ablating some of the problem areas can improve the heart rhythm or return it to normal. Ablation may be done for people who:  Have Wolff-Parkinson-White syndrome.  Have fast heart rhythms (tachycardia).  Have taken medicines for an abnormal heart rhythm (arrhythmia) that were not effective or caused side effects.  Have a high-risk heartbeat that may be life-threatening. During the procedure, a small incision is made in the neck or the groin, and a long, thin, flexible tube (catheter) is inserted into the incision and moved to the heart. Small devices (electrodes) on the tip of the catheter will send out electrical currents. A type of X-ray (fluoroscopy) will be used to help guide the catheter and to provide images of the heart. Tell a health care provider about:  Any allergies you have.  All medicines you are taking, including vitamins, herbs, eye drops, creams, and over-the-counter medicines.  Any problems you or family members have had with anesthetic medicines.  Any blood disorders you have.  Any surgeries you have had.  Any medical conditions you have, such as kidney failure.  Whether you are pregnant or may be  pregnant. What are the risks? Generally, this is a safe procedure. However, problems may occur, including:  Infection.  Bruising and bleeding at the catheter insertion site.  Bleeding into the chest, especially into the sac that surrounds the heart. This is a serious complication.  Stroke or blood clots.  Damage to other structures or organs.  Allergic reaction to medicines or dyes.  Need for a permanent pacemaker if the normal electrical system is damaged. A pacemaker is a small computer that sends electrical signals to the heart and helps your heart beat normally.  The procedure not being fully effective. This may not be recognized until months later. Repeat ablation procedures are sometimes required. What happens before the procedure?  Follow instructions from your health care provider about eating or drinking restrictions.  Ask your health care provider about: ? Changing or stopping your regular medicines. This is especially important if you are taking diabetes medicines or blood thinners. ? Taking medicines such as aspirin and ibuprofen. These medicines can thin your blood. Do not take these medicines before your procedure if your health care provider instructs you not to.  Plan to have someone take you home from the hospital or clinic.  If you will be going home right after the procedure, plan to have someone with you for 24 hours. What happens during the procedure?  To lower your risk of infection: ? Your health care team will wash or sanitize their hands. ? Your skin will be washed with soap. ? Hair may be removed from the incision area.  An IV tube will be inserted into one of your veins.  You will be  given a medicine to help you relax (sedative).  The skin on your neck or groin will be numbed.  An incision will be made in your neck or your groin.  A needle will be inserted through the incision and into a large vein in your neck or groin.  A catheter will be  inserted into the needle and moved to your heart.  Dye may be injected through the catheter to help your surgeon see the area of the heart that needs treatment.  Electrical currents will be sent from the catheter to ablate heart tissue in desired areas. There are three types of energy that may be used to ablate heart tissue: ? Heat (radiofrequency energy). ? Laser energy. ? Extreme cold (cryoablation).  When the necessary tissue has been ablated, the catheter will be removed.  Pressure will be held on the catheter insertion area to prevent excessive bleeding.  A bandage (dressing) will be placed over the catheter insertion area. The procedure may vary among health care providers and hospitals. What happens after the procedure?  Your blood pressure, heart rate, breathing rate, and blood oxygen level will be monitored until the medicines you were given have worn off.  Your catheter insertion area will be monitored for bleeding. You will need to lie still for a few hours to ensure that you do not bleed from the catheter insertion area.  Do not drive for 24 hours or as long as directed by your health care provider. Summary  Cardiac ablation is a procedure to disable (ablate) a small amount of heart tissue in very specific places. Ablating some of the problem areas can improve the heart rhythm or return it to normal.  During the procedure, electrical currents will be sent from the catheter to ablate heart tissue in desired areas. This information is not intended to replace advice given to you by your health care provider. Make sure you discuss any questions you have with your health care provider. Document Revised: 05/16/2018 Document Reviewed: 10/12/2016 Elsevier Patient Education  [place].

## 2020-07-09 NOTE — Progress Notes (Signed)
Electrophysiology Office Note   Date:  07/09/2020   ID:  Casy, Brunetto 09/11/1946, MRN [id number]  PCP:  Shon Baton, MD  Cardiologist:  Aundra Dubin Primary Electrophysiologist:  Cyrena Kuchenbecker Meredith Leeds, MD    Chief Complaint: AF   History of Present Illness: Ralf Konopka is a 74 y.o. male who is being seen today for the evaluation of AF at the request of Larey Dresser, MD. Presenting today for electrophysiology evaluation.  He has a history of coronary artery disease status post CABG, mechanical AVR on Coumadin, PAD, diabetes, hypertension, hyperlipidemia, ITP, TIAs, and recurrent GI bleeds.  He has had an aortobifemoral bypass in 2016 followed by iliac stenting and femoral endarterectomy.  In 2017 he had ostial to mid right SFA intervention.  He has history of left renal artery stenting in 2016 with repeat intervention.    He was admitted October 2020 with chest pain and was found to have severe underlying coronary disease and medical management was recommended.  He was readmitted later that month with heart failure and chest pain.  Hospital course was complicated by atrial flutter and low output heart failure.  He had orbital atherectomy and stenting of a left main.  Echo showed an ejection fraction of 30 to 35%.  Repeat echo shows normalized ejection fraction.  Today, he denies symptoms of palpitations, chest pain, shortness of breath, orthopnea, PND, lower extremity edema, claudication, dizziness, presyncope, syncope, bleeding, or neurologic sequela. The patient is tolerating medications without difficulties.  Since his hospitalization he has done well.  He has had known chest pain or shortness of breath.  His ejection fraction is fortunately normalized.  He is noted no further episodes of atrial fibrillation or flutter.   Past Medical History:  Diagnosis Date   Adenomatous colon polyp 09/1997   Anemia    Aortic stenosis    s/p [facility] mechanical AVR - Chronic  Coumadin   Blood transfusion    "related to ITP"   Coronary artery disease    s/p cabg x 3 11/2003: lima-lad, seq vg to rpda and rpl   Diverticulitis of colon    Heart murmur    Hyperlipidemia    Hypertension    ITP (idiopathic thrombocytopenic purpura)    Peripheral arterial disease ([place])    a. history of aortobifemoral bypass grafting by Dr. Sherren Mocha early b. LE angiography 04/22/2015 patent aortobifem graft, DES to R SFA   Peripheral vascular disease ([place])    s/p Left external Iliac Artery stenting and subsequent left femoral endarterectomy 02/2011- post op course complicated by wound infxn req I&D 03/2011   Renal artery stenosis, native, bilateral ([place])    a. bilateral renal artery stenosis by recent duplex ultrasound b. L renal artery stent 02/2015, R renal artery patent on angiogram   Stroke [facility])    TIA (transient ischemic attack) ~ 2013   Type II diabetes mellitus ([place])    Past Surgical History:  Procedure Laterality Date   ABDOMINAL AORTAGRAM N/A 12/16/2011   Procedure: ABDOMINAL Maxcine Ham;  Surgeon: Sherren Mocha, MD;  Location: [facility] CATH LAB;  Service: Cardiovascular;  Laterality: N/A;   ABDOMINAL AORTOGRAM W/LOWER EXTREMITY Right 02/29/2020   Procedure: ABDOMINAL AORTOGRAM W/LOWER EXTREMITY;  Surgeon: Lorretta Harp, MD;  Location: [place] CV LAB;  Service: Cardiovascular;  Laterality: Right;   ANGIOPLASTY / STENTING ILIAC     Left external Iliac Artery   AORTA - BILATERAL FEMORAL ARTERY BYPASS GRAFT  01/18/2012   Procedure: AORTA BIFEMORAL BYPASS  GRAFT;  Surgeon: Curt Jews, MD;  Location: [place];  Service: Vascular;  Laterality: N/A;   AORTIC VALVE REPLACEMENT  ~ 2004   CARDIAC CATHETERIZATION  11/2003   /pt report 10/01/2016   CARDIAC VALVE REPLACEMENT  11/2003   aortic   CARDIOVERSION N/A 10/16/2019   Procedure: CARDIOVERSION;  Surgeon: Larey Dresser, MD;  Location: [facility] ENDOSCOPY;  Service: Cardiovascular;  Laterality: N/A;   CATARACT EXTRACTION W/  INTRAOCULAR LENS  IMPLANT, BILATERAL Bilateral    COLONOSCOPY     COLONOSCOPY WITH PROPOFOL N/A 09/03/2019   Procedure: COLONOSCOPY WITH PROPOFOL;  Surgeon: Doran Stabler, MD;  Location: [facility] ENDOSCOPY;  Service: Gastroenterology;  Laterality: N/A;   CORONARY ARTERY BYPASS GRAFT  11/2003   Archie Endo 04/21/2011   CORONARY ATHERECTOMY N/A 10/10/2019   Procedure: CORONARY ATHERECTOMY;  Surgeon: Sherren Mocha, MD;  Location: [place] CV LAB;  Service: Cardiovascular;  Laterality: N/A;   CORONARY STENT INTERVENTION N/A 10/10/2019   Procedure: CORONARY STENT INTERVENTION;  Surgeon: Sherren Mocha, MD;  Location: [place] CV LAB;  Service: Cardiovascular;  Laterality: N/A;   CORONARY/GRAFT ANGIOGRAPHY N/A 09/18/2019   Procedure: CORONARY/GRAFT ANGIOGRAPHY;  Surgeon: Lorretta Harp, MD;  Location: [place] CV LAB;  Service: Cardiovascular;  Laterality: N/A;   ESOPHAGOGASTRODUODENOSCOPY (EGD) WITH PROPOFOL N/A 02/27/2019   Procedure: ESOPHAGOGASTRODUODENOSCOPY (EGD) WITH PROPOFOL;  Surgeon: Doran Stabler, MD;  Location: [place];  Service: Endoscopy;  Laterality: N/A;   HEMOSTASIS CLIP PLACEMENT  09/03/2019   Procedure: HEMOSTASIS CLIP PLACEMENT;  Surgeon: Doran Stabler, MD;  Location: [facility] ENDOSCOPY;  Service: Gastroenterology;;   LOWER EXTREMITY ANGIOGRAM N/A 02/21/2015   Procedure: LOWER EXTREMITY ANGIOGRAM;  Surgeon: Lorretta Harp, MD;  Location: [facility] CATH LAB;  Service: Cardiovascular;  Laterality: N/A;   PERIPHERAL VASCULAR CATHETERIZATION N/A 04/22/2015   Procedure: Lower Extremity Angiography;  Surgeon: Lorretta Harp, MD;  Location: [place] CV LAB;  Service: Cardiovascular;  Laterality: N/A;   PERIPHERAL VASCULAR CATHETERIZATION N/A 08/24/2016   Procedure: Lower Extremity Angiography;  Surgeon: Lorretta Harp, MD;  Location: [place] CV LAB;  Service: Cardiovascular;  Laterality: N/A;   PERIPHERAL VASCULAR CATHETERIZATION Right 10/01/2016   Procedure:  Peripheral Vascular Intervention - STENT;  Surgeon: Lorretta Harp, MD;  Location: [place] CV LAB;  Service: Cardiovascular;  Laterality: Right;  Prox and MID SFA    PERIPHERAL VASCULAR INTERVENTION  02/29/2020   Procedure: PERIPHERAL VASCULAR INTERVENTION;  Surgeon: Lorretta Harp, MD;  Location: [place] CV LAB;  Service: Cardiovascular;;  Right SFA   POLYPECTOMY     RENAL ANGIOGRAM N/A 02/21/2015   Procedure: RENAL ANGIOGRAM;  Surgeon: Lorretta Harp, MD;  Location[facility] CATH LAB;  Service: Cardiovascular;  Laterality: Bilateral; 6 mm x 12 mm long Herculink balloon expandable stent to the left renal artery   RENAL ARTERY STENT Left 04/22/2015   dr berry   SPLENECTOMY  02/2003   Archie Endo 04/21/2011   TEE WITHOUT CARDIOVERSION N/A 10/16/2019   Procedure: TRANSESOPHAGEAL ECHOCARDIOGRAM (TEE);  Surgeon: Larey Dresser, MD;  Location: [facility] ENDOSCOPY;  Service: Cardiovascular;  Laterality: N/A;   TEMPORARY PACEMAKER N/A 10/10/2019   Procedure: TEMPORARY PACEMAKER;  Surgeon: Sherren Mocha, MD;  Location: [place] CV LAB;  Service: Cardiovascular;  Laterality: N/A;   TONSILLECTOMY  ~ 1952     Current Outpatient Medications  Medication Sig Dispense Refill   amiodarone (PACERONE) 100 MG tablet Take 1 tablet (100 mg total) by mouth daily. 90 tablet  1   amoxicillin (AMOXIL) 500 MG capsule Take 2,000 mg by mouth See admin instructions. Take 2000 mg by mouth 30-60 minutes before appointment     atorvastatin (LIPITOR) 80 MG tablet Take 80 mg by mouth daily.      clopidogrel (PLAVIX) 75 MG tablet TAKE 1 TABLET BY MOUTH EVERY DAY 90 tablet 3   enoxaparin (LOVENOX) 120 MG/0.8ML injection Inject 0.8 mLs (120 mg total) into the skin daily. 8 mL 1   ENTRESTO 24-26 MG TAKE 1 TABLET BY MOUTH TWICE A DAY 60 tablet 5   Evolocumab (REPATHA SURECLICK) 678 MG/ML SOAJ Inject 140 mg into the skin every 14 (fourteen) days.     furosemide (LASIX) 20 MG tablet Take 1 tablet (20 mg total) by  mouth daily. 90 tablet 3   glyBURIDE (DIABETA) 2.5 MG tablet Take 2.5 mg by mouth 2 (two) times daily with a meal.      INVOKANA 100 MG TABS tablet Take 100 mg by mouth daily.     nitroGLYCERIN (NITROSTAT) 0.4 MG SL tablet Place 1 tablet (0.4 mg total) under the tongue every 5 (five) minutes x 3 doses as needed for chest pain. 25 tablet 12   pantoprazole (PROTONIX) 40 MG tablet TAKE 1 TABLET BY MOUTH EVERY DAY 90 tablet 0   spironolactone (ALDACTONE) 25 MG tablet TAKE 1 TABLET BY MOUTH EVERY DAY 90 tablet 3   warfarin (COUMADIN) 5 MG tablet Take 5-10 mg by mouth See admin instructions. Take 10 mg by mouth on Sunday and Thursday, all remaining days take 5 mg daily     Current Facility-Administered Medications  Medication Dose Route Frequency Provider Last Rate Last Admin   sodium chloride flush (NS) 0.9 % injection 3 mL  3 mL Intravenous Q12H Lorretta Harp, MD        Allergies:   Patient has no known allergies.   Social History:  The patient  reports that he quit smoking about 26 years ago. His smoking use included cigarettes and cigars. He quit after 30.00 years of use. He has never used smokeless tobacco. He reports current alcohol use of about 16.0 standard drinks of alcohol per week. He reports that he does not use drugs.   Family History:  The patient's family history includes Breast cancer in his sister; Coronary artery disease in his mother; Diabetes in his paternal aunt, paternal grandmother, and another family member; Heart disease in his father.    ROS:  Please see the history of present illness.   Otherwise, review of systems is positive for none.   All other systems are reviewed and negative.    PHYSICAL EXAM: VS:  BP 128/68    Pulse (!) 53    Ht 5\' 8"  (1.727 m)    Wt 173 lb (78.5 kg)    SpO2 97%    BMI 26.30 kg/m  , BMI Body mass index is 26.3 kg/m. GEN: Well nourished, well developed, in no acute distress  HEENT: normal  Neck: no JVD, carotid bruits, or  masses Cardiac: RRR; no murmurs, rubs, or gallops,no edema  Respiratory:  clear to auscultation bilaterally, normal work of breathing GI: soft, nontender, nondistended, + BS MS: no deformity or atrophy  Skin: warm and dry Neuro:  Strength and sensation are intact Psych: euthymic mood, full affect  EKG:  EKG is ordered today. Personal review of the ekg ordered shows sinus rhythm  Recent Labs: 10/02/2019: NT-Pro BNP 3,116 10/26/2019: Magnesium 2.5 03/05/2020: B Natriuretic Peptide 457.7 05/30/2020:  ALT 19; BUN 26; Creatinine, Ser 1.16; Hemoglobin 16.2; Platelets 232; Potassium 4.6; Sodium 140; TSH 1.313    Lipid Panel     Component Value Date/Time   CHOL 154 05/30/2020 1214   TRIG 47 05/30/2020 1214   HDL 101 05/30/2020 1214   CHOLHDL 1.5 05/30/2020 1214   VLDL 9 05/30/2020 1214   LDLCALC 44 05/30/2020 1214     Wt Readings from Last 3 Encounters:  07/09/20 173 lb (78.5 kg)  05/30/20 172 lb 12.8 oz (78.4 kg)  04/18/20 166 lb (75.3 kg)      Other studies Reviewed: Additional studies/ records that were reviewed today include: TTE 05/30/20  Review of the above records today demonstrates:  1. Left ventricular ejection fraction, by estimation, is 55 to 60%. The  left ventricle has normal function. The left ventricle has no regional  wall motion abnormalities. There is mild left ventricular hypertrophy.  Left ventricular diastolic parameters  are consistent with Grade II diastolic dysfunction (pseudonormalization).  2. Right ventricular systolic function is normal. The right ventricular  size is normal. Tricuspid regurgitation signal is inadequate for assessing  PA pressure.  3. The mitral valve is normal in structure. Trivial mitral valve  regurgitation. No evidence of mitral stenosis.  4. Mechanical aortic valve with no significant stenosis (mean gradient 9)  and trivial regurgitation.  5. The inferior vena cava is normal in size with greater than 50%  respiratory  variability, suggesting right atrial pressure of 3 mmHg.    ASSESSMENT AND PLAN:  1.  Chronic systolic heart failure due to ischemic cardiomyopathy: Fortunately his ejection fraction has improved back to normal.  Currently on Entresto, Lasix, Aldactone.  Plan per primary cardiology.  2.  Paroxysmal atrial fibrillation/atrial flutter: Cardioversion November 2020.  He remains in sinus rhythm.  Currently on amiodarone and Coumadin.  I spoke with him about the possibility of ablation for his atrial fibrillation versus continued amiodarone with lab monitoring.  Risks and benefits of ablation were discussed include bleeding, tamponade, heart block, stroke, damage to chest organs.  At this point, he is comfortable being on his amiodarone.  Allow him to continue follow-up in heart failure clinic and I Yuvraj Pfeifer see him back if he changes mind and wishes for ablation.  3.  Coronary artery disease: Has very complex disease.  Currently on Plavix and warfarin.  Case discussed with primary cardiology  Current medicines are reviewed at length with the patient today.   The patient does not have concerns regarding his medicines.  The following changes were made today:  none  Labs/ tests ordered today include:  Orders Placed This Encounter  Procedures   EKG 12-Lead     Disposition:   FU with Sabrea Sankey as needed  Signed, Schylar Allard Meredith Leeds, MD  07/09/2020 12:37 PM     [facility] [address] [place] [address] [phone] (office) [phone] (fax)

## 2020-07-10 ENCOUNTER — Ambulatory Visit (INDEPENDENT_AMBULATORY_CARE_PROVIDER_SITE_OTHER): Payer: Medicare Other | Admitting: Pharmacist

## 2020-07-10 DIAGNOSIS — Z5181 Encounter for therapeutic drug level monitoring: Secondary | ICD-10-CM

## 2020-07-10 DIAGNOSIS — G459 Transient cerebral ischemic attack, unspecified: Secondary | ICD-10-CM

## 2020-07-10 DIAGNOSIS — I359 Nonrheumatic aortic valve disorder, unspecified: Secondary | ICD-10-CM | POA: Diagnosis not present

## 2020-07-10 DIAGNOSIS — Z7901 Long term (current) use of anticoagulants: Secondary | ICD-10-CM | POA: Diagnosis not present

## 2020-07-10 LAB — POCT INR: INR: 4.1 — AB (ref 2.0–3.0)

## 2020-07-10 NOTE — Patient Instructions (Addendum)
Description   Hold dose tomorrow then start taking 1 tablet (5mg ) daily except for 1.5 tablets on Wednesdays. Recheck INR in 2 weeks. Coumadin Clinic for any changes in medications or upcoming procedures. [phone]

## 2020-07-11 ENCOUNTER — Other Ambulatory Visit: Payer: Self-pay | Admitting: Cardiovascular Disease

## 2020-07-12 ENCOUNTER — Ambulatory Visit (HOSPITAL_COMMUNITY)
Admission: RE | Admit: 2020-07-12 | Discharge: 2020-07-12 | Disposition: A | Discharge status: Home / Self Care | Payer: Medicare Other | Source: Ambulatory Visit | Attending: Cardiology | Admitting: Cardiology

## 2020-07-12 ENCOUNTER — Other Ambulatory Visit: Payer: Self-pay

## 2020-07-12 ENCOUNTER — Other Ambulatory Visit (HOSPITAL_COMMUNITY): Payer: Self-pay | Admitting: Cardiology

## 2020-07-12 DIAGNOSIS — Z9889 Other specified postprocedural states: Secondary | ICD-10-CM

## 2020-07-12 DIAGNOSIS — I701 Atherosclerosis of renal artery: Secondary | ICD-10-CM | POA: Insufficient documentation

## 2020-07-18 ENCOUNTER — Other Ambulatory Visit: Payer: Self-pay | Admitting: Gastroenterology

## 2020-07-24 ENCOUNTER — Other Ambulatory Visit: Payer: Self-pay

## 2020-07-24 ENCOUNTER — Ambulatory Visit (INDEPENDENT_AMBULATORY_CARE_PROVIDER_SITE_OTHER): Payer: Medicare Other | Admitting: Pharmacist

## 2020-07-24 DIAGNOSIS — Z5181 Encounter for therapeutic drug level monitoring: Secondary | ICD-10-CM | POA: Diagnosis not present

## 2020-07-24 DIAGNOSIS — I359 Nonrheumatic aortic valve disorder, unspecified: Secondary | ICD-10-CM | POA: Diagnosis not present

## 2020-07-24 LAB — POCT INR: INR: 3.4 — AB (ref 2.0–3.0)

## 2020-07-24 NOTE — Patient Instructions (Signed)
Description   Take 1/2 tablet tomorrow, then start taking 1 tablet (5mg ) daily. Recheck INR in 3 weeks. Coumadin Clinic for any changes in medications or upcoming procedures. [phone]

## 2020-08-08 ENCOUNTER — Other Ambulatory Visit: Payer: Self-pay

## 2020-08-08 DIAGNOSIS — I701 Atherosclerosis of renal artery: Secondary | ICD-10-CM

## 2020-08-10 ENCOUNTER — Other Ambulatory Visit (HOSPITAL_COMMUNITY): Payer: Self-pay | Admitting: Adult Health

## 2020-08-14 ENCOUNTER — Other Ambulatory Visit: Payer: Self-pay

## 2020-08-14 ENCOUNTER — Ambulatory Visit (INDEPENDENT_AMBULATORY_CARE_PROVIDER_SITE_OTHER): Payer: Medicare Other | Admitting: Pharmacist

## 2020-08-14 DIAGNOSIS — Z5181 Encounter for therapeutic drug level monitoring: Secondary | ICD-10-CM

## 2020-08-14 DIAGNOSIS — I359 Nonrheumatic aortic valve disorder, unspecified: Secondary | ICD-10-CM | POA: Diagnosis not present

## 2020-08-14 LAB — POCT INR: INR: 2.8 (ref 2.0–3.0)

## 2020-08-14 NOTE — Patient Instructions (Signed)
Continue taking 1 tablet (5mg ) daily. Recheck INR in 4 weeks per pt preference. Coumadin Clinic for any changes in medications or upcoming procedures. [phone]

## 2020-08-27 DIAGNOSIS — Z23 Encounter for immunization: Secondary | ICD-10-CM | POA: Diagnosis not present

## 2020-09-03 ENCOUNTER — Ambulatory Visit (INDEPENDENT_AMBULATORY_CARE_PROVIDER_SITE_OTHER): Payer: Medicare Other | Admitting: Podiatry

## 2020-09-03 ENCOUNTER — Encounter: Payer: Self-pay | Admitting: Podiatry

## 2020-09-03 ENCOUNTER — Other Ambulatory Visit: Payer: Self-pay

## 2020-09-03 DIAGNOSIS — B351 Tinea unguium: Secondary | ICD-10-CM

## 2020-09-03 DIAGNOSIS — D689 Coagulation defect, unspecified: Secondary | ICD-10-CM | POA: Diagnosis not present

## 2020-09-03 NOTE — Progress Notes (Signed)
  Subjective:  Patient ID: [name] [name], male    DOB: 08-Aug-1946,  MRN: [id number]  Chief Complaint  Patient presents with  . Nail Problem    Nail trim 1-5 bilateral    74 y.o. male presents with the above complaint. History confirmed with patient. On chronic anticoagulant  Objective:  Physical Exam: warm, good capillary refill, no trophic changes or ulcerative lesions, normal DP and PT pulses and normal sensory exam. Nails thickened, elongated.   Assessment:   1. Onychomycosis   2. Coagulation defect [facility])    Plan:  Patient was evaluated and treated and all questions answered.  Onychomycosis and Coagulation Defect -Nails palliatively debrided secondary to pain  Procedure: Nail Debridement Rationale: Patient meets criteria for routine foot care due to coag defect Type of Debridement: manual, sharp debridement. Instrumentation: Nail nipper, rotary burr. Number of Nails: 10  Return if symptoms worsen or fail to improve.

## 2020-09-10 DIAGNOSIS — Z23 Encounter for immunization: Secondary | ICD-10-CM | POA: Diagnosis not present

## 2020-09-11 ENCOUNTER — Other Ambulatory Visit: Payer: Self-pay

## 2020-09-11 ENCOUNTER — Ambulatory Visit (INDEPENDENT_AMBULATORY_CARE_PROVIDER_SITE_OTHER): Payer: Medicare Other | Admitting: *Deleted

## 2020-09-11 DIAGNOSIS — Z5181 Encounter for therapeutic drug level monitoring: Secondary | ICD-10-CM

## 2020-09-11 DIAGNOSIS — I359 Nonrheumatic aortic valve disorder, unspecified: Secondary | ICD-10-CM

## 2020-09-11 LAB — POCT INR: INR: 2.7 (ref 2.0–3.0)

## 2020-09-11 NOTE — Patient Instructions (Signed)
Description   Continue taking 1 tablet (5mg ) daily. Recheck INR in 3 weeks with Lori's appt. Coumadin Clinic for any changes in medications or upcoming procedures. [phone]

## 2020-09-23 NOTE — Progress Notes (Addendum)
CARDIOLOGY OFFICE NOTE  Date:  09/30/2020    [name] [name] Date of Birth: 1946-01-14 Medical Record #[id number]  PCP:  Shon Baton, MD  Cardiologist: Nishan/Berry/McLean  Chief Complaint  Patient presents with  . Follow-up    History of Present Illness: [name] [name] is a 74 y.o. male who presents today for a follow up visit. He is seen for Dr. Johnsie Cancel. He following in the CHF clinic with Dr. Aundra Dubin and sees Dr. Gwenlyn Found for PAD.   He has known CAD with prior CABG, mechanical AVR - on Coumadin, PAD, DM, HTN, HLD, ITP, TIAs and recurrent GI bleed. He had aortobifemoral bypass in 2016, followed by iliac stenting and femoral endarterectomy. He has had right SFA intervention in 2017, left renal artery stenting in 2016.   He has since developed CHF - his CAD has been managed medically. He has had atrial flutter. EF 30 to 35% that has improved. He underwent cath in October 2020 and had PCI of the left main per Dr. Burt Knack at that time.  He was last seen by Dr. Johnsie Cancel in March. He saw Dr. Aundra Dubin in June. He has seen EP - Dr. Curt Bears in August. There was discussion about possible ablation - he elected to stay on amiodarone therapy.   Comes in today. Here alone. He feels like he is doing well. No chest pain. Breathing is good. No swelling. BP typically in the 120's at home. High initially here today. No palpitations. He would like to discuss proceeding on with ablation - previously he felt like he had had too much going on and wanted to wait. Tolerating his medicines. No bleeding with his coumadin. INR 2.3 today. He is staying active - likes to golf. Trying to watch his diet. Overall, no real concerns.   Past Medical History:  Diagnosis Date  . Adenomatous colon polyp 09/1997  . Anemia   . Aortic stenosis    s/p st[facility] mechanical AVR - Chronic Coumadin  . Blood transfusion    "related to ITP"  . Coronary artery disease    s/p cabg x 3 11/2003: lima-lad, seq vg to rpda  and rpl  . Diverticulitis of colon   . Heart murmur   . Hyperlipidemia   . Hypertension   . ITP (idiopathic thrombocytopenic purpura)   . Peripheral arterial disease ([place])    a. history of aortobifemoral bypass grafting by Dr. Sherren Mocha early b. LE angiography 04/22/2015 patent aortobifem graft, DES to R SFA  . Peripheral vascular disease ([place])    s/p Left external Iliac Artery stenting and subsequent left femoral endarterectomy 02/2011- post op course complicated by wound infxn req I&D 03/2011  . Renal artery stenosis, native, bilateral ([facility])    a. bilateral renal artery stenosis by recent duplex ultrasound b. L renal artery stent 02/2015, R renal artery patent on angiogram  . Stroke ([place])   . TIA (transient ischemic attack) ~ 2013  . Type II diabetes mellitus ([place])     Past Surgical History:  Procedure Laterality Date  . ABDOMINAL AORTAGRAM N/A 12/16/2011   Procedure: ABDOMINAL Maxcine Ham;  Surgeon: Sherren Mocha, MD;  Location: [facility] CATH LAB;  Service: Cardiovascular;  Laterality: N/A;  . ABDOMINAL AORTOGRAM W/LOWER EXTREMITY Right 02/29/2020   Procedure: ABDOMINAL AORTOGRAM W/LOWER EXTREMITY;  Surgeon: Lorretta Harp, MD;  Location: [place] CV LAB;  Service: Cardiovascular;  Laterality: Right;  . ANGIOPLASTY / STENTING ILIAC     Left external Iliac Artery  . AORTA -  BILATERAL FEMORAL ARTERY BYPASS GRAFT  01/18/2012   Procedure: AORTA BIFEMORAL BYPASS GRAFT;  Surgeon: Curt Jews, MD;  Location: [place];  Service: Vascular;  Laterality: N/A;  . AORTIC VALVE REPLACEMENT  ~ 2004  . CARDIAC CATHETERIZATION  11/2003   /pt report 10/01/2016  . CARDIAC VALVE REPLACEMENT  11/2003   aortic  . CARDIOVERSION N/A 10/16/2019   Procedure: CARDIOVERSION;  Surgeon: Larey Dresser, MD;  Location: [facility] ENDOSCOPY;  Service: Cardiovascular;  Laterality: N/A;  . CATARACT EXTRACTION W/ INTRAOCULAR LENS  IMPLANT, BILATERAL Bilateral   . COLONOSCOPY    . COLONOSCOPY WITH PROPOFOL N/A 09/03/2019   Procedure:  COLONOSCOPY WITH PROPOFOL;  Surgeon: Doran Stabler, MD;  Location: [facility] ENDOSCOPY;  Service: Gastroenterology;  Laterality: N/A;  . CORONARY ARTERY BYPASS GRAFT  11/2003   Archie Endo 04/21/2011  . CORONARY ATHERECTOMY N/A 10/10/2019   Procedure: CORONARY ATHERECTOMY;  Surgeon: Sherren Mocha, MD;  Location: [place] CV LAB;  Service: Cardiovascular;  Laterality: N/A;  . CORONARY STENT INTERVENTION N/A 10/10/2019   Procedure: CORONARY STENT INTERVENTION;  Surgeon: Sherren Mocha, MD;  Location: [place] CV LAB;  Service: Cardiovascular;  Laterality: N/A;  . CORONARY/GRAFT ANGIOGRAPHY N/A 09/18/2019   Procedure: CORONARY/GRAFT ANGIOGRAPHY;  Surgeon: Lorretta Harp, MD;  Location: [place] CV LAB;  Service: Cardiovascular;  Laterality: N/A;  . ESOPHAGOGASTRODUODENOSCOPY (EGD) WITH PROPOFOL N/A 02/27/2019   Procedure: ESOPHAGOGASTRODUODENOSCOPY (EGD) WITH PROPOFOL;  Surgeon: Doran Stabler, MD;  Location: [place];  Service: Endoscopy;  Laterality: N/A;  . HEMOSTASIS CLIP PLACEMENT  09/03/2019   Procedure: HEMOSTASIS CLIP PLACEMENT;  Surgeon: Doran Stabler, MD;  Location: [facility] ENDOSCOPY;  Service: Gastroenterology;;  . LOWER EXTREMITY ANGIOGRAM N/A 02/21/2015   Procedure: LOWER EXTREMITY ANGIOGRAM;  Surgeon: Lorretta Harp, MD;  Location: [facility] CATH LAB;  Service: Cardiovascular;  Laterality: N/A;  . PERIPHERAL VASCULAR CATHETERIZATION N/A 04/22/2015   Procedure: Lower Extremity Angiography;  Surgeon: Lorretta Harp, MD;  Location: [place] CV LAB;  Service: Cardiovascular;  Laterality: N/A;  . PERIPHERAL VASCULAR CATHETERIZATION N/A 08/24/2016   Procedure: Lower Extremity Angiography;  Surgeon: Lorretta Harp, MD;  Location: [place] CV LAB;  Service: Cardiovascular;  Laterality: N/A;  . PERIPHERAL VASCULAR CATHETERIZATION Right 10/01/2016   Procedure: Peripheral Vascular Intervention - STENT;  Surgeon: Lorretta Harp, MD;  Location: [place] CV LAB;  Service:  Cardiovascular;  Laterality: Right;  Prox and MID SFA   . PERIPHERAL VASCULAR INTERVENTION  02/29/2020   Procedure: PERIPHERAL VASCULAR INTERVENTION;  Surgeon: Lorretta Harp, MD;  Location: [place] CV LAB;  Service: Cardiovascular;;  Right SFA  . POLYPECTOMY    . RENAL ANGIOGRAM N/A 02/21/2015   Procedure: RENAL ANGIOGRAM;  Surgeon: Lorretta Harp, MD;  Location: [facility] CATH LAB;  Service: Cardiovascular;  Laterality: Bilateral; 6 mm x 12 mm long Herculink balloon expandable stent to the left renal artery  . RENAL ARTERY STENT Left 04/22/2015   dr berry  . SPLENECTOMY  02/2003   Archie Endo 04/21/2011  . TEE WITHOUT CARDIOVERSION N/A 10/16/2019   Procedure: TRANSESOPHAGEAL ECHOCARDIOGRAM (TEE);  Surgeon: Larey Dresser, MD;  Location: [facility] ENDOSCOPY;  Service: Cardiovascular;  Laterality: N/A;  . TEMPORARY PACEMAKER N/A 10/10/2019   Procedure: TEMPORARY PACEMAKER;  Surgeon: Sherren Mocha, MD;  Location: [place] CV LAB;  Service: Cardiovascular;  Laterality: N/A;  . TONSILLECTOMY  ~ 1952     Medications: Current Meds  Medication Sig  . amiodarone (PACERONE) 100 MG tablet  Take 1 tablet (100 mg total) by mouth daily.  [name] amiodarone (PACERONE) 100 MG tablet TAKE 1 TABLET BY MOUTH TWO TIMES A DAY FOR 7 DAYS, THEN 1 TABLET DAILY THEREAFTER  . amoxicillin (AMOXIL) 500 MG capsule Take 2,000 mg by mouth See admin instructions. Take 2000 mg by mouth 30-60 minutes before dental appointment  . atorvastatin (LIPITOR) 80 MG tablet Take 80 mg by mouth daily.   . clopidogrel (PLAVIX) 75 MG tablet TAKE 1 TABLET BY MOUTH EVERY DAY  . ENTRESTO 24-26 MG TAKE 1 TABLET BY MOUTH TWICE A DAY  . Evolocumab (REPATHA SURECLICK) 509 MG/ML SOAJ Inject 140 mg into the skin every 14 (fourteen) days.  . furosemide (LASIX) 20 MG tablet Take 1 tablet (20 mg total) by mouth daily.  [name] glyBURIDE (DIABETA) 2.5 MG tablet Take 2.5 mg by mouth 2 (two) times daily with a meal.   . INVOKANA 100 MG TABS tablet Take 100 mg by  mouth daily.  . nitroGLYCERIN (NITROSTAT) 0.4 MG SL tablet Place 1 tablet (0.4 mg total) under the tongue every 5 (five) minutes x 3 doses as needed for chest pain.  [name] warfarin (COUMADIN) 5 MG tablet TAKE AS DIRECTED BY COUMADIN CLINIC  . [DISCONTINUED] digoxin (LANOXIN) 0.125 MG tablet   . [DISCONTINUED] enoxaparin (LOVENOX) 120 MG/0.8ML injection Inject 0.8 mLs (120 mg total) into the skin daily.   Current Facility-Administered Medications for the 09/30/20 encounter (Office Visit) with Burtis Junes, NP  Medication  . sodium chloride flush (NS) 0.9 % injection 3 mL     Allergies: No Known Allergies  Social History: The patient  reports that he quit smoking about 26 years ago. His smoking use included cigarettes and cigars. He quit after 30.00 years of use. He has never used smokeless tobacco. He reports current alcohol use of about 16.0 standard drinks of alcohol per week. He reports that he does not use drugs.   Family History: The patient's family history includes Breast cancer in his sister; Coronary artery disease in his mother; Diabetes in his paternal aunt, paternal grandmother, and another family member; Heart disease in his father.   Review of Systems: Please see the history of present illness.   All other systems are reviewed and negative.   Physical Exam: VS:  BP (!) 180/90 (BP Location: Left Arm, Patient Position: Sitting, Cuff Size: Normal)   Pulse 77   Ht 5\' 8"  (1.727 m)   Wt 176 lb (79.8 kg)   BMI 26.76 kg/m  .  BMI Body mass index is 26.76 kg/m.  Wt Readings from Last 3 Encounters:  09/30/20 176 lb (79.8 kg)  07/09/20 173 lb (78.5 kg)  05/30/20 172 lb 12.8 oz (78.4 kg)   BP down to 122/80 by me.   General: Alert and in no acute distress.   HEENT: Normal but face is quite [place].  Neck: Supple, no JVD, carotid bruits, or masses noted.  Cardiac: Regular rate and rhythm. Valve sounds good and crisp. No edema.  Respiratory:  Lungs are clear to auscultation  bilaterally with normal work of breathing.  GI: Soft and nontender.  MS: No deformity or atrophy. Gait and ROM intact.  Skin: Warm and dry. Color is normal.  Neuro:  Strength and sensation are intact and no gross focal deficits noted.  Psych: Alert, appropriate and with normal affect.   LABORATORY DATA:  EKG:  EKG is ordered today.  Personally reviewed by me - this shows sinus with 1st degree AV block.  HR is 77.   Lab Results  Component Value Date   WBC 7.0 05/30/2020   HGB 16.2 05/30/2020   HCT 50.7 05/30/2020   PLT 232 05/30/2020   GLUCOSE 108 (H) 05/30/2020   CHOL 154 05/30/2020   TRIG 47 05/30/2020   HDL 101 05/30/2020   LDLCALC 44 05/30/2020   ALT 19 05/30/2020   AST 26 05/30/2020   NA 140 05/30/2020   K 4.6 05/30/2020   CL 106 05/30/2020   CREATININE 1.16 05/30/2020   BUN 26 (H) 05/30/2020   CO2 23 05/30/2020   TSH 1.313 05/30/2020   INR 2.3 09/30/2020   HGBA1C 8.2 (H) 10/08/2019     BNP (last 3 results) Recent Labs    10/06/19 0958 03/05/20 1125  BNP 899.1* 457.7*    ProBNP (last 3 results) Recent Labs    10/02/19 1131  PROBNP 3,116*     Other Studies Reviewed Today:  TTE 05/30/20  Review of the above records today demonstrates:  1. Left ventricular ejection fraction, by estimation, is 55 to 60%. The  left ventricle has normal function. The left ventricle has no regional  wall motion abnormalities. There is mild left ventricular hypertrophy.  Left ventricular diastolic parameters  are consistent with Grade II diastolic dysfunction (pseudonormalization).  2. Right ventricular systolic function is normal. The right ventricular  size is normal. Tricuspid regurgitation signal is inadequate for assessing  PA pressure.  3. The mitral valve is normal in structure. Trivial mitral valve  regurgitation. No evidence of mitral stenosis.  4. Mechanical aortic valve with no significant stenosis (mean gradient 9)  and trivial regurgitation.  5. The  inferior vena cava is normal in size with greater than 50%  respiratory variability, suggesting right atrial pressure of 3 mmHg.    CORONARY STENT INTERVENTION 10/2019  CORONARY ATHERECTOMY  TEMPORARY PACEMAKER  Conclusion  Successful orbital atherectomy, PTCA, and stenting of the left main with a 4.0x15 mm Resolute Onyx DES  Recommend:   Resume heparin 8 hours after sheath out  Resume warfarin tonight  Continue ASA 30 days  Plavix at least 6 months if tolerated   CORONARY/GRAFT ANGIOGRAPHY 09/2019  Conclusion    Ost RCA to Prox RCA lesion is 100% stenosed.  Ost LM to Mid LM lesion is 99% stenosed.  Dist LM to Prox LAD lesion is 100% stenosed.  Mid Cx lesion is 90% stenosed.       ASSESSMENT AND PLAN:  1.  Chronic systolic HF/ICM - EF has improved. He is doing well clinically and on good therapy. No longer on Digoxin due to symptomatic slow HR. Would continue with current regimen of Entresto, Lasix, & Aldactone. NYHA I at this time. Looks euvolemic.   2. PAF - remains on low dose amiodarone - needs surveillance labs today - he would like to reconsider ablation with Dr. Curt Bears again.   3. CAD - complex disease - remains on Plavix and Warfarin - no bleeding noted. He was to stop Plavix after a year of therapy and restart aspirin (due 10/2020).   4. High risk medicine - needs labs today.   5. Mechanical AV - no problems noted. Not on aspirin - on Plavix for another month.   6. PAD - not discussed.   7. HTN - recheck by me was ok. No changes made - encouraged him to monitor.    Current medicines are reviewed with the patient today.  The patient does not have concerns regarding medicines other than what  has been noted above.  The following changes have been made:  See above.  Labs/ tests ordered today include:   No orders of the defined types were placed in this encounter.    Disposition:   FU with Dr. Johnsie Cancel in 6 months - he was to see Dr. Aundra Dubin  next month (no scheduled appointment yet) and will send message to get him back to see Dr. Curt Bears. Needs surveillance labs today.   Patient is agreeable to this plan and will call if any problems develop in the interim.   SignedTruitt Merle, NP  09/30/2020 3:55 PM  [place] [address] [place],   [address] Phone: [phone] Fax: [phone]

## 2020-09-24 DIAGNOSIS — H4313 Vitreous hemorrhage, bilateral: Secondary | ICD-10-CM | POA: Diagnosis not present

## 2020-09-24 DIAGNOSIS — H43393 Other vitreous opacities, bilateral: Secondary | ICD-10-CM | POA: Diagnosis not present

## 2020-09-24 DIAGNOSIS — H15833 Staphyloma posticum, bilateral: Secondary | ICD-10-CM | POA: Diagnosis not present

## 2020-09-24 DIAGNOSIS — H43813 Vitreous degeneration, bilateral: Secondary | ICD-10-CM | POA: Diagnosis not present

## 2020-09-30 ENCOUNTER — Other Ambulatory Visit: Payer: Self-pay

## 2020-09-30 ENCOUNTER — Ambulatory Visit (INDEPENDENT_AMBULATORY_CARE_PROVIDER_SITE_OTHER): Payer: Medicare Other | Admitting: Nurse Practitioner

## 2020-09-30 ENCOUNTER — Encounter: Payer: Self-pay | Admitting: Nurse Practitioner

## 2020-09-30 ENCOUNTER — Ambulatory Visit (INDEPENDENT_AMBULATORY_CARE_PROVIDER_SITE_OTHER): Payer: Medicare Other | Admitting: *Deleted

## 2020-09-30 VITALS — BP 180/90 | HR 77 | Ht 68.0 in | Wt 176.0 lb

## 2020-09-30 DIAGNOSIS — Z79899 Other long term (current) drug therapy: Secondary | ICD-10-CM

## 2020-09-30 DIAGNOSIS — Z952 Presence of prosthetic heart valve: Secondary | ICD-10-CM

## 2020-09-30 DIAGNOSIS — I259 Chronic ischemic heart disease, unspecified: Secondary | ICD-10-CM

## 2020-09-30 DIAGNOSIS — I359 Nonrheumatic aortic valve disorder, unspecified: Secondary | ICD-10-CM

## 2020-09-30 DIAGNOSIS — Z5181 Encounter for therapeutic drug level monitoring: Secondary | ICD-10-CM | POA: Diagnosis not present

## 2020-09-30 DIAGNOSIS — I480 Paroxysmal atrial fibrillation: Secondary | ICD-10-CM

## 2020-09-30 LAB — POCT INR: INR: 2.3 (ref 2.0–3.0)

## 2020-09-30 NOTE — Patient Instructions (Signed)
Description    Take 1.5 tablets today and then continue taking 1 tablet (5mg ) daily. Recheck INR in 3 weeks. Coumadin Clinic for any changes in medications or upcoming procedures. [phone]

## 2020-09-30 NOTE — Patient Instructions (Addendum)
After Visit Summary:  We will be checking the following labs today - BMET, CBC, HPF and TSh   Medication Instructions:    Continue with your current medicines.    If you need a refill on your cardiac medications before your next appointment, please call your pharmacy.     Testing/Procedures To Be Arranged:  N/A  Follow-Up:   See Dr. Johnsie Cancel in 6 months - You will receive a reminder letter in the mail two months in advance. If you don't receive a letter, please call our office to schedule the follow-up appointment.  You are due to see Dr. Aundra Dubin in November - his office should be contacting you.   I will get a message to Dr. Macky Lower nurse about wanting to proceed with ablation.     At [facility], you and your health needs are our priority.  As part of our continuing mission to provide you with exceptional heart care, we have created designated Provider Care Teams.  These Care Teams include your primary Cardiologist (physician) and Advanced Practice Providers (APPs -  Physician Assistants and Nurse Practitioners) who all work together to provide you with the care you need, when you need it.  Special Instructions:  . Stay safe, wash your hands for at least 20 seconds and wear a mask when needed.  . It was good to talk with you today.    Call the [place] office at [phone] if you have any questions, problems or concerns.

## 2020-10-01 ENCOUNTER — Encounter: Payer: Self-pay | Admitting: Cardiology

## 2020-10-01 ENCOUNTER — Ambulatory Visit (INDEPENDENT_AMBULATORY_CARE_PROVIDER_SITE_OTHER): Payer: Medicare Other | Admitting: Cardiology

## 2020-10-01 VITALS — BP 122/74 | HR 74 | Ht 68.0 in | Wt 176.0 lb

## 2020-10-01 DIAGNOSIS — I259 Chronic ischemic heart disease, unspecified: Secondary | ICD-10-CM | POA: Diagnosis not present

## 2020-10-01 DIAGNOSIS — I480 Paroxysmal atrial fibrillation: Secondary | ICD-10-CM | POA: Diagnosis not present

## 2020-10-01 DIAGNOSIS — I4819 Other persistent atrial fibrillation: Secondary | ICD-10-CM

## 2020-10-01 DIAGNOSIS — Z01812 Encounter for preprocedural laboratory examination: Secondary | ICD-10-CM

## 2020-10-01 LAB — HEPATIC FUNCTION PANEL
ALT: 17 IU/L (ref 0–44)
AST: 20 IU/L (ref 0–40)
Albumin: 4.3 g/dL (ref 3.7–4.7)
Alkaline Phosphatase: 80 IU/L (ref 44–121)
Bilirubin Total: 0.4 mg/dL (ref 0.0–1.2)
Bilirubin, Direct: 0.17 mg/dL (ref 0.00–0.40)
Total Protein: 6.9 g/dL (ref 6.0–8.5)

## 2020-10-01 LAB — CBC
Hematocrit: 50.7 % (ref 37.5–51.0)
Hemoglobin: 16.8 g/dL (ref 13.0–17.7)
MCH: 31.3 pg (ref 26.6–33.0)
MCHC: 33.1 g/dL (ref 31.5–35.7)
MCV: 95 fL (ref 79–97)
Platelets: 202 10*3/uL (ref 150–450)
RBC: 5.36 x10E6/uL (ref 4.14–5.80)
RDW: 13.3 % (ref 11.6–15.4)
WBC: 7.5 10*3/uL (ref 3.4–10.8)

## 2020-10-01 LAB — BASIC METABOLIC PANEL
BUN/Creatinine Ratio: 15 (ref 10–24)
BUN: 19 mg/dL (ref 8–27)
CO2: 27 mmol/L (ref 20–29)
Calcium: 9.4 mg/dL (ref 8.6–10.2)
Chloride: 101 mmol/L (ref 96–106)
Creatinine, Ser: 1.25 mg/dL (ref 0.76–1.27)
GFR calc Af Amer: 65 mL/min/{1.73_m2} (ref >59)
GFR calc non Af Amer: 56 mL/min/{1.73_m2} — ABNORMAL LOW (ref >59)
Glucose: 205 mg/dL — ABNORMAL HIGH (ref 65–99)
Potassium: 4.4 mmol/L (ref 3.5–5.2)
Sodium: 141 mmol/L (ref 134–144)

## 2020-10-01 LAB — TSH: TSH: 1.97 u[IU]/mL (ref 0.450–4.500)

## 2020-10-01 NOTE — Progress Notes (Signed)
Electrophysiology Office Note   Date:  10/02/2020   ID:  [name] [name], [name] [name] [birth year], MRN [id number]  PCP:  Shon Baton, MD  Cardiologist:  Aundra Dubin Primary Electrophysiologist:  Hildegarde Dunaway Meredith Leeds, MD    Chief Complaint: AF   History of Present Illness: [name] [name] is a 74 y.o. male who is being seen today for the evaluation of AF at the request of Shon Baton, MD. Presenting today for electrophysiology evaluation.  He has a history of coronary artery disease status post CABG, mechanical AVR on Coumadin, PAD, diabetes, hypertension, hyperlipidemia, ITP, TIAs, and recurrent GI bleeds.  He has had an aortofemoral bypass in 2016 followed by iliac stenting and femoral artery endarterectomy.  In 2017 he had ostial to mid right SFA intervention.  He had history of left renal artery stenting in 2016 with repeat intervention.  He was admitted October 2020 with chest pain and was found to have severe underlying coronary disease and medical management was recommended.  He was readmitted later that month with heart failure and chest pain.  Hospital course was complicated by atrial flutter and low output heart failure.  He had orbital atherectomy and stenting of his left main.  Echo showed an ejection fraction of 30 to 35%.  Repeat echo shows normalized ejection fraction.  Today, denies symptoms of palpitations, chest pain, shortness of breath, orthopnea, PND, lower extremity edema, claudication, dizziness, presyncope, syncope, bleeding, or neurologic sequela. The patient is tolerating medications without difficulties.  He has been doing well.  He has no chest pain or shortness of breath.  At this point he would prefer ablation to be able to stop his amiodarone.   Past Medical History:  Diagnosis Date  . Adenomatous colon polyp 09/1997  . Anemia   . Aortic stenosis    s/p [facility] mechanical AVR - Chronic Coumadin  . Blood transfusion    "related to ITP"  . Coronary artery  disease    s/p cabg x 3 11/2003: lima-lad, seq vg to rpda and rpl  . Diverticulitis of colon   . Heart murmur   . Hyperlipidemia   . Hypertension   . ITP (idiopathic thrombocytopenic purpura)   . Peripheral arterial disease ([place])    a. history of aortobifemoral bypass grafting by Dr. Sherren Mocha early b. LE angiography 04/22/2015 patent aortobifem graft, DES to R SFA  . Peripheral vascular disease ([place])    s/p Left external Iliac Artery stenting and subsequent left femoral endarterectomy 02/2011- post op course complicated by wound infxn req I&D 03/2011  . Renal artery stenosis, native, bilateral (HCC)    a. bilateral renal artery stenosis by recent duplex ultrasound b. L renal artery stent 02/2015, R renal artery patent on angiogram  . Stroke ([place])   . TIA (transient ischemic attack) ~ 2013  . Type II diabetes mellitus ([place])    Past Surgical History:  Procedure Laterality Date  . ABDOMINAL AORTAGRAM N/A 12/16/2011   Procedure: ABDOMINAL Maxcine Ham;  Surgeon: Sherren Mocha, MD;  [facility] CATH LAB;  Service: Cardiovascular;  Laterality: N/A;  . ABDOMINAL AORTOGRAM W/LOWER EXTREMITY Right 02/29/2020   Procedure: ABDOMINAL AORTOGRAM W/LOWER EXTREMITY;  Surgeon: Lorretta Harp, MD;  Location: [place] CV LAB;  Service: Cardiovascular;  Laterality: Right;  . ANGIOPLASTY / STENTING ILIAC     Left external Iliac Artery  . AORTA - BILATERAL FEMORAL ARTERY BYPASS GRAFT  01/18/2012   Procedure: AORTA BIFEMORAL BYPASS GRAFT;  Surgeon: Curt Jews, MD;  Location: [place];  Service: Vascular;  Laterality: N/A;  . AORTIC VALVE REPLACEMENT  ~ 2004  . CARDIAC CATHETERIZATION  11/2003   /pt report 10/01/2016  . CARDIAC VALVE REPLACEMENT  11/2003   aortic  . CARDIOVERSION N/A 10/16/2019   Procedure: CARDIOVERSION;  Surgeon: Larey Dresser, MD;  Location: [facility] ENDOSCOPY;  Service: Cardiovascular;  Laterality: N/A;  . CATARACT EXTRACTION W/ INTRAOCULAR LENS  IMPLANT, BILATERAL Bilateral   . COLONOSCOPY    .  COLONOSCOPY WITH PROPOFOL N/A 09/03/2019   Procedure: COLONOSCOPY WITH PROPOFOL;  Surgeon: Doran Stabler, MD;  Location: [facility] ENDOSCOPY;  Service: Gastroenterology;  Laterality: N/A;  . CORONARY ARTERY BYPASS GRAFT  11/2003   Archie Endo 04/21/2011  . CORONARY ATHERECTOMY N/A 10/10/2019   Procedure: CORONARY ATHERECTOMY;  Surgeon: Sherren Mocha, MD;  Location: [place] CV LAB;  Service: Cardiovascular;  Laterality: N/A;  . CORONARY STENT INTERVENTION N/A 10/10/2019   Procedure: CORONARY STENT INTERVENTION;  Surgeon: Sherren Mocha, MD;  Location: [place] CV LAB;  Service: Cardiovascular;  Laterality: N/A;  . CORONARY/GRAFT ANGIOGRAPHY N/A 09/18/2019   Procedure: CORONARY/GRAFT ANGIOGRAPHY;  Surgeon: Lorretta Harp, MD;  Location: [place] CV LAB;  Service: Cardiovascular;  Laterality: N/A;  . ESOPHAGOGASTRODUODENOSCOPY (EGD) WITH PROPOFOL N/A 02/27/2019   Procedure: ESOPHAGOGASTRODUODENOSCOPY (EGD) WITH PROPOFOL;  Surgeon: Doran Stabler, MD;  Location: [place];  Service: Endoscopy;  Laterality: N/A;  . HEMOSTASIS CLIP PLACEMENT  09/03/2019   Procedure: HEMOSTASIS CLIP PLACEMENT;  Surgeon: Doran Stabler, MD;  Location: [facility] ENDOSCOPY;  Service: Gastroenterology;;  . LOWER EXTREMITY ANGIOGRAM N/A 02/21/2015   Procedure: LOWER EXTREMITY ANGIOGRAM;  Surgeon: Lorretta Harp, MD;  Location: [facility] CATH LAB;  Service: Cardiovascular;  Laterality: N/A;  . PERIPHERAL VASCULAR CATHETERIZATION N/A 04/22/2015   Procedure: Lower Extremity Angiography;  Surgeon: Lorretta Harp, MD;  Location: [place] CV LAB;  Service: Cardiovascular;  Laterality: N/A;  . PERIPHERAL VASCULAR CATHETERIZATION N/A 08/24/2016   Procedure: Lower Extremity Angiography;  Surgeon: Lorretta Harp, MD;  Location: [place] CV LAB;  Service: Cardiovascular;  Laterality: N/A;  . PERIPHERAL VASCULAR CATHETERIZATION Right 10/01/2016   Procedure: Peripheral Vascular Intervention - STENT;  Surgeon: Lorretta Harp,  MD;  Location: [place] CV LAB;  Service: Cardiovascular;  Laterality: Right;  Prox and MID SFA   . PERIPHERAL VASCULAR INTERVENTION  02/29/2020   Procedure: PERIPHERAL VASCULAR INTERVENTION;  Surgeon: Lorretta Harp, MD;  Location: [place] CV LAB;  Service: Cardiovascular;;  Right SFA  . POLYPECTOMY    . RENAL ANGIOGRAM N/A 02/21/2015   Procedure: RENAL ANGIOGRAM;  Surgeon: Lorretta Harp, MD;  Location: [facility] CATH LAB;  Service: Cardiovascular;  Laterality: Bilateral; 6 mm x 12 mm long Herculink balloon expandable stent to the left renal artery  . RENAL ARTERY STENT Left 04/22/2015   dr berry  . SPLENECTOMY  02/2003   Archie Endo 04/21/2011  . TEE WITHOUT CARDIOVERSION N/A 10/16/2019   Procedure: TRANSESOPHAGEAL ECHOCARDIOGRAM (TEE);  Surgeon: Larey Dresser, MD;  Location: [facility] ENDOSCOPY;  Service: Cardiovascular;  Laterality: N/A;  . TEMPORARY PACEMAKER N/A 10/10/2019   Procedure: TEMPORARY PACEMAKER;  Surgeon: Sherren Mocha, MD;  Location: [place] CV LAB;  Service: Cardiovascular;  Laterality: N/A;  . TONSILLECTOMY  ~ 1952     Current Outpatient Medications  Medication Sig Dispense Refill  . amiodarone (PACERONE) 100 MG tablet Take 1 tablet (100 mg total) by mouth daily. 90 tablet 1  . amoxicillin (AMOXIL) 500 MG capsule Take 2,000 mg  by mouth See admin instructions. Take 2000 mg by mouth 30-60 minutes before dental appointment    . atorvastatin (LIPITOR) 80 MG tablet Take 80 mg by mouth daily.     . clopidogrel (PLAVIX) 75 MG tablet TAKE 1 TABLET BY MOUTH EVERY DAY 90 tablet 3  . ENTRESTO 24-26 MG TAKE 1 TABLET BY MOUTH TWICE A DAY 60 tablet 5  . Evolocumab (REPATHA SURECLICK) 099 MG/ML SOAJ Inject 140 mg into the skin every 14 (fourteen) days.    . furosemide (LASIX) 20 MG tablet Take 1 tablet (20 mg total) by mouth daily. 90 tablet 3  . glyBURIDE (DIABETA) 2.5 MG tablet Take 2.5 mg by mouth 2 (two) times daily with a meal.     . INVOKANA 100 MG TABS tablet Take 100 mg by  mouth daily.    . nitroGLYCERIN (NITROSTAT) 0.4 MG SL tablet Place 1 tablet (0.4 mg total) under the tongue every 5 (five) minutes x 3 doses as needed for chest pain. 25 tablet 12  . warfarin (COUMADIN) 5 MG tablet TAKE AS DIRECTED BY COUMADIN CLINIC 110 tablet 0   Current Facility-Administered Medications  Medication Dose Route Frequency Provider Last Rate Last Admin  . sodium chloride flush (NS) 0.9 % injection 3 mL  3 mL Intravenous Q12H Lorretta Harp, MD        Allergies:   Patient has no known allergies.   Social History:  The patient  reports that he quit smoking about 26 years ago. His smoking use included cigarettes and cigars. He quit after 30.00 years of use. He has never used smokeless tobacco. He reports current alcohol use of about 16.0 standard drinks of alcohol per week. He reports that he does not use drugs.   Family History:  The patient's family history includes Breast cancer in his sister; Coronary artery disease in his mother; Diabetes in his paternal aunt, paternal grandmother, and another family member; Heart disease in his father.   ROS:  Please see the history of present illness.   Otherwise, review of systems is positive for none.   All other systems are reviewed and negative.   PHYSICAL EXAM: VS:  BP 122/74   Pulse 74   Ht 5\' 8"  (1.727 m)   Wt 176 lb (79.8 kg)   SpO2 92%   BMI 26.76 kg/m  , BMI Body mass index is 26.76 kg/m. GEN: Well nourished, well developed, in no acute distress  HEENT: normal  Neck: no JVD, carotid bruits, or masses Cardiac: RRR; no murmurs, rubs, or gallops,no edema  Respiratory:  clear to auscultation bilaterally, normal work of breathing GI: soft, nontender, nondistended, + BS MS: no deformity or atrophy  Skin: warm and dry Neuro:  Strength and sensation are intact Psych: euthymic mood, full affect  EKG:  EKG is not ordered today. Personal review of the ekg ordered 10/01/20 shows sinus rhythm, rate 77  Recent  Labs: 10/26/2019: Magnesium 2.5 03/05/2020: B Natriuretic Peptide 457.7 09/30/2020: ALT 17; BUN 19; Creatinine, Ser 1.25; Hemoglobin 16.8; Platelets 202; Potassium 4.4; Sodium 141; TSH 1.970    Lipid Panel     Component Value Date/Time   CHOL 154 05/30/2020 1214   TRIG 47 05/30/2020 1214   HDL 101 05/30/2020 1214   CHOLHDL 1.5 05/30/2020 1214   VLDL 9 05/30/2020 1214   LDLCALC 44 05/30/2020 1214     Wt Readings from Last 3 Encounters:  10/01/20 176 lb (79.8 kg)  09/30/20 176 lb (79.8 kg)  07/09/20  173 lb (78.5 kg)      Other studies Reviewed: Additional studies/ records that were reviewed today include: TTE 05/30/20  Review of the above records today demonstrates:  1. Left ventricular ejection fraction, by estimation, is 55 to 60%. The  left ventricle has normal function. The left ventricle has no regional  wall motion abnormalities. There is mild left ventricular hypertrophy.  Left ventricular diastolic parameters  are consistent with Grade II diastolic dysfunction (pseudonormalization).  2. Right ventricular systolic function is normal. The right ventricular  size is normal. Tricuspid regurgitation signal is inadequate for assessing  PA pressure.  3. The mitral valve is normal in structure. Trivial mitral valve  regurgitation. No evidence of mitral stenosis.  4. Mechanical aortic valve with no significant stenosis (mean gradient 9)  and trivial regurgitation.  5. The inferior vena cava is normal in size with greater than 50%  respiratory variability, suggesting right atrial pressure of 3 mmHg.    ASSESSMENT AND PLAN:  1.  Chronic systolic heart failure due to ischemic cardiomyopathy: Fortunately his ejection fraction has improved to normal.  Currently on Entresto, Lasix, Aldactone.  Plan per primary cardiology.    2.  Persistent atrial fibrillation/flutter: Cardioversion November 2020.  He remains in sinus rhythm.  He is currently on amiodarone (monitoring for  high risk medication) and Coumadin.  CHA2DS2-VASc of 4.  At this point, he would prefer to be off of amiodarone and Eliquis prefer ablation.  Risks and benefits were discussed which bleeding, tamponade, heart block, stroke, damage to chest organs.  He understands these risks and has agreed to the procedure.    3.  Coronary artery disease: Has very complex disease.  Currently on Plavix and warfarin.  Case discussed with primary cardiology  Current medicines are reviewed at length with the patient today.   The patient does not have concerns regarding his medicines.  The following changes were made today: None  Labs/ tests ordered today include:  Orders Placed This Encounter  Procedures  . CT CARDIAC MORPH/PULM VEIN W/CM&W/O CA SCORE  . Basic metabolic panel  . CBC     Disposition:   FU with Nykolas Bacallao 3 months  Signed, Alvera Tourigny Meredith Leeds, MD  10/02/2020 7:49 AM     [facility] [address] [place] [address] [phone] (office) [phone] (fax)

## 2020-10-01 NOTE — Progress Notes (Signed)
Make sure he gets followup with me.

## 2020-10-01 NOTE — Patient Instructions (Signed)
Medication Instructions:  Your physician recommends that you continue on your current medications as directed. Please refer to the Current Medication list given to you today.  *If you need a refill on your cardiac medications before your next appointment, please call your pharmacy*   Lab Work: None ordered If you have labs (blood work) drawn today and your tests are completely normal, you will receive your results only by: [name] MyChart Message (if you have MyChart) OR . A paper copy in the mail If you have any lab test that is abnormal or we need to change your treatment, we will call you to review the results.   Testing/Procedures: Your physician has requested that you have cardiac CT within 7 days prior to your ablation. Cardiac computed tomography (CT) is a painless test that uses an x-ray machine to take clear, detailed pictures of your heart. Please follow instructions below located under "other instructions".  Your physician has recommended that you have an ablation. Catheter ablation is a medical procedure used to treat some cardiac arrhythmias (irregular heartbeats). During catheter ablation, a long, thin, flexible tube is put into a blood vessel in your groin (upper thigh), or neck. This tube is called an ablation catheter. It is then guided to your heart through the blood vessel. Radio frequency waves destroy small areas of heart tissue where abnormal heartbeats may cause an arrhythmia to start. Please follow instructions below located under "other instructions".   Follow-Up: At [facility], you and your health needs are our priority.  As part of our continuing mission to provide you with exceptional heart care, we have created designated Provider Care Teams.  These Care Teams include your primary Cardiologist (physician) and Advanced Practice Providers (APPs -  Physician Assistants and Nurse Practitioners) who all work together to provide you with the care you need, when you need  it.  Your next appointment:   1 month(s) after your ablation on   The format for your next appointment:   In Person  Provider:   AFib clinic   Thank you for choosing [facility]!!   Trinidad Curet, RN [phone]    Other Instructions  CT INSTRUCTIONS Your cardiac CT will be scheduled at:  [facility] [address] [place], [place] [address] [phone]  Please arrive at the [facility] main entrance of [facility] 30 minutes prior to test start time. Proceed to the [facility] Main Radiology Department (first floor) to check-in and test prep.  Please follow these instructions carefully (unless otherwise directed):  Hold all erectile dysfunction medications at least 3 days (72 hrs) prior to test.  On the Night Before the Test: . Be sure to Drink plenty of water. . Do not consume any caffeinated/decaffeinated beverages or chocolate 12 hours prior to your test. . Do not take any antihistamines 12 hours prior to your test.  On the Day of the Test: . Drink plenty of water. Do not drink any water within one hour of the test. . Do not eat any food 4 hours prior to the test. . You may take your regular medications prior to the test.  . Take metoprolol (Lopressor) 100 mg two hours prior to test. . HOLD Furosemide/Hydrochlorothiazide morning of the test.      After the Test: . Drink plenty of water. . After receiving IV contrast, you may experience a mild flushed feeling. This is normal. . On occasion, you may experience a mild rash up to 24 hours after  the test. This is not dangerous. If this occurs, you can take Benadryl 25 mg and increase your fluid intake. . If you experience trouble breathing, this can be serious. If it is severe call 911 IMMEDIATELY. If it is mild, please call our office. . If you take any of these medications: Glipizide/Metformin, Avandament, Glucavance, please do not take 48 hours after completing test unless otherwise  instructed.   Once we have confirmed authorization from your insurance company, we will call you to set up a date and time for your test. Based on how quickly your insurance processes prior authorizations requests, please allow up to 4 weeks to be contacted for scheduling your Cardiac CT appointment. Be advised that routine Cardiac CT appointments could be scheduled as many as 8 weeks after your provider has ordered it.  For non-scheduling related questions, please contact the cardiac imaging nurse navigator should you have any questions/concerns: Marchia Bond, Cardiac Imaging Nurse Navigator Burley Saver, Interim Cardiac Imaging Nurse [place] [facility] Services Direct Office Dial: [phone]   For scheduling needs, including cancellations and rescheduling, please call Vivien Rota at [phone], option 3.       Electrophysiology/Ablation Procedure Instructions   You are scheduled for a(n)  ablation on 11/20/20 with Dr. Allegra Lai.   1.   Pre procedure testing-             A.  LAB WORK --- On 11/06/20  for your pre procedure blood work at the [facility].  You do NOT need to be fasting.  You can stop by the office anytime between 7:30 am - 4:30 pm                B. COVID TEST-- On 11/18/20 @ 2:30 pm - This is a Drive Up Visit at [address] [place], [place] [address].  Someone will direct you to the appropriate testing line. Stay in your car and someone will be with you shortly.   After you are tested please go home and self quarantine until the day of your procedure.     2. On the day of your procedure 11/20/20 you will go to [facility] [phone]) at 9:30 am.  Dennis Bast will go to the main entrance A [employer]) and enter where the [employer] are.  Your driver will drop you off and you will head down the hallway to ADMITTING.  You may have one support person come in to the hospital with you.  They will be asked to wait in the waiting  room.   3.   Do not eat or drink after midnight prior to your procedure.   4.   Do not miss any doses of your blood thinner prior to the morning of your procedure or your procedure will need to be rescheduled.       Do NOT take any medications the morning of your procedure.   5.  Plan for an overnight stay, but you may be discharged home after your procedure.    If you use your phone frequently bring your phone charger, in case you have to stay.  If you are discharged after your procedure you will need someone to drive you home and be with your for 24 hours after your procedure.   6. You will follow up with the AFIB clinic 4 weeks after your procedure.  You will follow up with Dr. Curt Bears  3 months after your procedure.  These appointments will be made for you.   * If you have ANY questions please call the office [phone] and ask for Sherri RN or send me a MyChart message   * Occasionally, EP Studies and ablations can become lengthy.  Please make your family aware of this before your procedure starts.  Average time ranges from 2-8 hours for EP studies/ablations.  Your physician will call your family after the procedure with the results.                                     Cardiac Ablation  Cardiac ablation is a procedure to stop some heart tissue from causing problems. The heart has many electrical connections. Sometimes these connections make the heart beat very fast or irregularly. Removing some problem areas can improve the heart rhythm or make it normal. What happens before the procedure?  Follow instructions from your doctor about what you cannot eat or drink.  Ask your doctor about: ? Changing or stopping your normal medicines. This is important if you take diabetes medicines or blood thinners. ? Taking medicines such as aspirin and ibuprofen. These medicines can thin your blood. Do not take these medicines before your procedure if your doctor tells you not to.  Plan to have  someone take you home.  If you will be going home right after the procedure, plan to have someone with you for 24 hours. What happens during the procedure?  To lower your risk of infection: ? Your health care team will wash or sanitize their hands. ? Your skin will be washed with soap. ? Hair may be removed from your neck or groin.  An IV tube will be put into one of your veins.  You will be given a medicine to help you relax (sedative).  Skin on your neck or groin will be numbed.  A cut (incision) will be made in your neck or groin.  A needle will be put through your cut and into a vein in your neck or groin.  A tube (catheter) will be put into the needle. The tube will be moved to your heart. X-rays (fluoroscopy) will be used to help guide the tube.  Small devices (electrodes) on the tip of the tube will send out electrical currents.  Dye may be put through the tube. This helps your surgeon see your heart.  Electrical energy will be used to scar (ablate) some heart tissue. Your surgeon may use: ? Heat (radiofrequency energy). ? Laser energy. ? Extreme cold (cryoablation).  The tube will be taken out.  Pressure will be held on your cut. This helps stop bleeding.  A bandage (dressing) will be put on your cut. The procedure may vary. What happens after the procedure?  You will be monitored until your medicines have worn off.  Your cut will be watched for bleeding. You will need to lie still for a few hours.  Do not drive for 24 hours or as long as your doctor tells you. Summary  Cardiac ablation is a procedure to stop some heart tissue from causing problems.  Electrical energy will be used to scar (ablate) some heart tissue. This information is not intended to replace advice given to you by your health care provider. Make sure you discuss any questions you have with your health care provider. Document Revised: 11/05/2017 Document Reviewed: 10/12/2016 Elsevier Patient  Education  2020 Elsevier  Inc.

## 2020-10-02 ENCOUNTER — Other Ambulatory Visit: Payer: Self-pay | Admitting: Cardiovascular Disease

## 2020-10-03 ENCOUNTER — Telehealth (HOSPITAL_COMMUNITY): Payer: Self-pay | Admitting: Cardiology

## 2020-10-17 DIAGNOSIS — I6990 Unspecified sequelae of unspecified cerebrovascular disease: Secondary | ICD-10-CM | POA: Diagnosis not present

## 2020-10-17 DIAGNOSIS — E1151 Type 2 diabetes mellitus with diabetic peripheral angiopathy without gangrene: Secondary | ICD-10-CM | POA: Diagnosis not present

## 2020-10-17 DIAGNOSIS — I1310 Hypertensive heart and chronic kidney disease without heart failure, with stage 1 through stage 4 chronic kidney disease, or unspecified chronic kidney disease: Secondary | ICD-10-CM | POA: Diagnosis not present

## 2020-10-17 DIAGNOSIS — I739 Peripheral vascular disease, unspecified: Secondary | ICD-10-CM | POA: Diagnosis not present

## 2020-10-17 DIAGNOSIS — I2510 Atherosclerotic heart disease of native coronary artery without angina pectoris: Secondary | ICD-10-CM | POA: Diagnosis not present

## 2020-10-17 DIAGNOSIS — I4819 Other persistent atrial fibrillation: Secondary | ICD-10-CM | POA: Diagnosis not present

## 2020-10-17 DIAGNOSIS — Z9081 Acquired absence of spleen: Secondary | ICD-10-CM | POA: Diagnosis not present

## 2020-10-17 DIAGNOSIS — N1831 Chronic kidney disease, stage 3a: Secondary | ICD-10-CM | POA: Diagnosis not present

## 2020-10-17 DIAGNOSIS — J449 Chronic obstructive pulmonary disease, unspecified: Secondary | ICD-10-CM | POA: Diagnosis not present

## 2020-10-17 DIAGNOSIS — I700 Atherosclerosis of aorta: Secondary | ICD-10-CM | POA: Diagnosis not present

## 2020-10-17 DIAGNOSIS — E1122 Type 2 diabetes mellitus with diabetic chronic kidney disease: Secondary | ICD-10-CM | POA: Diagnosis not present

## 2020-10-17 DIAGNOSIS — I5032 Chronic diastolic (congestive) heart failure: Secondary | ICD-10-CM | POA: Diagnosis not present

## 2020-10-18 DIAGNOSIS — H00011 Hordeolum externum right upper eyelid: Secondary | ICD-10-CM | POA: Diagnosis not present

## 2020-10-21 ENCOUNTER — Ambulatory Visit (INDEPENDENT_AMBULATORY_CARE_PROVIDER_SITE_OTHER): Payer: Medicare Other | Admitting: *Deleted

## 2020-10-21 ENCOUNTER — Other Ambulatory Visit: Payer: Self-pay

## 2020-10-21 DIAGNOSIS — I359 Nonrheumatic aortic valve disorder, unspecified: Secondary | ICD-10-CM | POA: Diagnosis not present

## 2020-10-21 DIAGNOSIS — Z5181 Encounter for therapeutic drug level monitoring: Secondary | ICD-10-CM | POA: Diagnosis not present

## 2020-10-21 LAB — POCT INR: INR: 1.6 — AB (ref 2.0–3.0)

## 2020-10-21 NOTE — Patient Instructions (Signed)
Description   Take 1.5 tablet of warfarin today and tomorrow, then start taking 1 tablet (5mg ) daily except for 1.5 tablets (7.5mg ) on Sundays. Recheck INR in 1 week. Coumadin Clinic for any changes in medications or upcoming procedures. [phone]

## 2020-10-28 ENCOUNTER — Other Ambulatory Visit: Payer: Self-pay

## 2020-10-28 ENCOUNTER — Ambulatory Visit (INDEPENDENT_AMBULATORY_CARE_PROVIDER_SITE_OTHER): Payer: Medicare Other | Admitting: *Deleted

## 2020-10-28 DIAGNOSIS — Z5181 Encounter for therapeutic drug level monitoring: Secondary | ICD-10-CM

## 2020-10-28 DIAGNOSIS — I359 Nonrheumatic aortic valve disorder, unspecified: Secondary | ICD-10-CM

## 2020-10-28 LAB — POCT INR: INR: 3 (ref 2.0–3.0)

## 2020-10-28 NOTE — Patient Instructions (Signed)
Description   Continue taking 1 tablet (5mg ) daily except for 1.5 tablets (7.5mg ) on Sundays. Recheck INR in 1 week. Pending ablation 11/20/2020. Coumadin Clinic for any changes in medications or upcoming procedures. [phone]

## 2020-11-04 ENCOUNTER — Ambulatory Visit (INDEPENDENT_AMBULATORY_CARE_PROVIDER_SITE_OTHER): Payer: Medicare Other | Admitting: *Deleted

## 2020-11-04 ENCOUNTER — Other Ambulatory Visit: Payer: Self-pay

## 2020-11-04 ENCOUNTER — Other Ambulatory Visit: Payer: Medicare Other | Admitting: *Deleted

## 2020-11-04 DIAGNOSIS — Z5181 Encounter for therapeutic drug level monitoring: Secondary | ICD-10-CM

## 2020-11-04 DIAGNOSIS — I359 Nonrheumatic aortic valve disorder, unspecified: Secondary | ICD-10-CM

## 2020-11-04 DIAGNOSIS — Z01812 Encounter for preprocedural laboratory examination: Secondary | ICD-10-CM

## 2020-11-04 DIAGNOSIS — I480 Paroxysmal atrial fibrillation: Secondary | ICD-10-CM | POA: Diagnosis not present

## 2020-11-04 DIAGNOSIS — I4819 Other persistent atrial fibrillation: Secondary | ICD-10-CM | POA: Diagnosis not present

## 2020-11-04 LAB — POCT INR: INR: 2.3 (ref 2.0–3.0)

## 2020-11-04 NOTE — Patient Instructions (Signed)
Description   Take 1.5 tablets today and then Continue taking 1 tablet (5mg ) daily except for 1.5 tablets (7.5mg ) on Sundays. Recheck INR in 1 week. Pending ablation 11/20/2020. Coumadin Clinic for any changes in medications or upcoming procedures. [phone]

## 2020-11-05 LAB — CBC
Hematocrit: 47.4 % (ref 37.5–51.0)
Hemoglobin: 15.9 g/dL (ref 13.0–17.7)
MCH: 31.8 pg (ref 26.6–33.0)
MCHC: 33.5 g/dL (ref 31.5–35.7)
MCV: 95 fL (ref 79–97)
Platelets: 216 10*3/uL (ref 150–450)
RBC: 5 x10E6/uL (ref 4.14–5.80)
RDW: 13.3 % (ref 11.6–15.4)
WBC: 8.5 10*3/uL (ref 3.4–10.8)

## 2020-11-05 LAB — BASIC METABOLIC PANEL
BUN/Creatinine Ratio: 17 (ref 10–24)
BUN: 20 mg/dL (ref 8–27)
CO2: 26 mmol/L (ref 20–29)
Calcium: 9.1 mg/dL (ref 8.6–10.2)
Chloride: 100 mmol/L (ref 96–106)
Creatinine, Ser: 1.2 mg/dL (ref 0.76–1.27)
GFR calc Af Amer: 68 mL/min/{1.73_m2} (ref >59)
GFR calc non Af Amer: 59 mL/min/{1.73_m2} — ABNORMAL LOW (ref >59)
Glucose: 201 mg/dL — ABNORMAL HIGH (ref 65–99)
Potassium: 4 mmol/L (ref 3.5–5.2)
Sodium: 140 mmol/L (ref 134–144)

## 2020-11-06 ENCOUNTER — Other Ambulatory Visit: Payer: Medicare Other

## 2020-11-11 ENCOUNTER — Other Ambulatory Visit: Payer: Self-pay

## 2020-11-11 ENCOUNTER — Ambulatory Visit (INDEPENDENT_AMBULATORY_CARE_PROVIDER_SITE_OTHER): Payer: Medicare Other | Admitting: *Deleted

## 2020-11-11 DIAGNOSIS — I359 Nonrheumatic aortic valve disorder, unspecified: Secondary | ICD-10-CM

## 2020-11-11 DIAGNOSIS — Z5181 Encounter for therapeutic drug level monitoring: Secondary | ICD-10-CM | POA: Diagnosis not present

## 2020-11-11 LAB — POCT INR: INR: 2.4 (ref 2.0–3.0)

## 2020-11-11 NOTE — Patient Instructions (Signed)
Description   Take 1.5 tablets today and then start taking 1 tablet daily except for 1.5 tablets on Sundays and Thursdays. Recheck INR in 1 week. Pending ablation 11/20/2020. Coumadin Clinic for any changes in medications or upcoming procedures. [phone]

## 2020-11-12 ENCOUNTER — Telehealth (HOSPITAL_COMMUNITY): Payer: Self-pay | Admitting: Emergency Medicine

## 2020-11-12 NOTE — Telephone Encounter (Signed)
Pt returning phone call regarding upcoming cardiac imaging study; pt verbalizes understanding of appt date/time, parking situation and where to check in, pre-test NPO status and medications ordered, and verified current allergies; name and call back number provided for further questions should they arise Marchia Bond RN Navigator Cardiac Imaging [facility] [phone] office [phone] cell   Pt instructed to take meds per usual, avoiding lasix

## 2020-11-12 NOTE — Telephone Encounter (Signed)
Attempted to call patient regarding upcoming cardiac CT appointment. °Left message on voicemail with name and callback number °Arsenia Goracke RN Navigator Cardiac Imaging °[facility] °[phone] Office °[phone] Cell ° °

## 2020-11-13 ENCOUNTER — Ambulatory Visit (HOSPITAL_COMMUNITY)
Admission: RE | Admit: 2020-11-13 | Discharge: 2020-11-13 | Disposition: A | Discharge status: Home / Self Care | Payer: Medicare Other | Source: Ambulatory Visit | Attending: Cardiology | Admitting: Cardiology

## 2020-11-13 ENCOUNTER — Other Ambulatory Visit: Payer: Self-pay

## 2020-11-13 DIAGNOSIS — I480 Paroxysmal atrial fibrillation: Secondary | ICD-10-CM | POA: Insufficient documentation

## 2020-11-13 MED ORDER — IOHEXOL 350 MG/ML SOLN
80.0000 mL | Freq: Once | INTRAVENOUS | Status: AC | PRN
  Administered 2020-11-13: 80 mL via INTRAVENOUS

## 2020-11-18 ENCOUNTER — Ambulatory Visit (INDEPENDENT_AMBULATORY_CARE_PROVIDER_SITE_OTHER): Payer: Medicare Other | Admitting: *Deleted

## 2020-11-18 ENCOUNTER — Other Ambulatory Visit: Payer: Self-pay

## 2020-11-18 ENCOUNTER — Other Ambulatory Visit (HOSPITAL_COMMUNITY)
Admission: RE | Admit: 2020-11-18 | Discharge: 2020-11-18 | Disposition: A | Discharge status: Home / Self Care | Payer: Medicare Other | Source: Ambulatory Visit | Attending: Cardiology | Admitting: Cardiology

## 2020-11-18 DIAGNOSIS — Z01812 Encounter for preprocedural laboratory examination: Secondary | ICD-10-CM | POA: Insufficient documentation

## 2020-11-18 DIAGNOSIS — I359 Nonrheumatic aortic valve disorder, unspecified: Secondary | ICD-10-CM

## 2020-11-18 DIAGNOSIS — Z20822 Contact with and (suspected) exposure to covid-19: Secondary | ICD-10-CM | POA: Insufficient documentation

## 2020-11-18 DIAGNOSIS — Z5181 Encounter for therapeutic drug level monitoring: Secondary | ICD-10-CM | POA: Diagnosis not present

## 2020-11-18 LAB — POCT INR: INR: 2.3 (ref 2.0–3.0)

## 2020-11-18 NOTE — Patient Instructions (Signed)
Description   Take 1.5 tablets today then continue taking 1 tablet daily except for 1.5 tablets on Sundays and Thursdays. Recheck INR in 1 week. Pending ablation 11/20/2020. [facility] for any changes in medications or upcoming procedures. [phone]

## 2020-11-19 LAB — SARS CORONAVIRUS 2 (TAT 6-24 HRS): SARS Coronavirus 2: NEGATIVE

## 2020-11-19 NOTE — Progress Notes (Signed)
Instructed patient on the following items: Arrival time 0930 Nothing to eat or drink after midnight No meds AM of procedure Responsible person to drive you home and stay with you for 24 hrs  Have you missed any doses of anti-coagulant Coumadin- no doses missed

## 2020-11-20 ENCOUNTER — Ambulatory Visit (HOSPITAL_COMMUNITY): Payer: Medicare Other | Admitting: Certified Registered Nurse Anesthetist

## 2020-11-20 ENCOUNTER — Encounter (HOSPITAL_COMMUNITY): Payer: Self-pay | Admitting: Cardiology

## 2020-11-20 ENCOUNTER — Ambulatory Visit (HOSPITAL_COMMUNITY)
Admission: RE | Admit: 2020-11-20 | Discharge: 2020-11-20 | Disposition: A | Discharge status: Home / Self Care | Payer: Medicare Other | Attending: Cardiology | Admitting: Cardiology

## 2020-11-20 ENCOUNTER — Encounter (HOSPITAL_COMMUNITY)
Admission: RE | Disposition: A | Discharge status: Home / Self Care | Payer: Self-pay | Source: Home / Self Care | Attending: Cardiology

## 2020-11-20 ENCOUNTER — Other Ambulatory Visit: Payer: Self-pay

## 2020-11-20 DIAGNOSIS — I110 Hypertensive heart disease with heart failure: Secondary | ICD-10-CM | POA: Diagnosis not present

## 2020-11-20 DIAGNOSIS — Z87891 Personal history of nicotine dependence: Secondary | ICD-10-CM | POA: Insufficient documentation

## 2020-11-20 DIAGNOSIS — I4892 Unspecified atrial flutter: Secondary | ICD-10-CM | POA: Insufficient documentation

## 2020-11-20 DIAGNOSIS — Z8249 Family history of ischemic heart disease and other diseases of the circulatory system: Secondary | ICD-10-CM | POA: Diagnosis not present

## 2020-11-20 DIAGNOSIS — I4819 Other persistent atrial fibrillation: Secondary | ICD-10-CM | POA: Diagnosis not present

## 2020-11-20 DIAGNOSIS — I252 Old myocardial infarction: Secondary | ICD-10-CM | POA: Diagnosis not present

## 2020-11-20 DIAGNOSIS — I5041 Acute combined systolic (congestive) and diastolic (congestive) heart failure: Secondary | ICD-10-CM | POA: Diagnosis not present

## 2020-11-20 DIAGNOSIS — I480 Paroxysmal atrial fibrillation: Secondary | ICD-10-CM | POA: Diagnosis not present

## 2020-11-20 HISTORY — PX: ATRIAL FIBRILLATION ABLATION: EP1191

## 2020-11-20 LAB — GLUCOSE, CAPILLARY
Glucose-Capillary: 169 mg/dL — ABNORMAL HIGH (ref 70–99)
Glucose-Capillary: 118 mg/dL — ABNORMAL HIGH (ref 70–99)

## 2020-11-20 LAB — PROTIME-INR
INR: 2 — ABNORMAL HIGH (ref 0.8–1.2)
Prothrombin Time: 22.2 seconds — ABNORMAL HIGH (ref 11.4–15.2)

## 2020-11-20 LAB — POCT ACTIVATED CLOTTING TIME: Activated Clotting Time: 380 seconds

## 2020-11-20 SURGERY — ATRIAL FIBRILLATION ABLATION
Anesthesia: General

## 2020-11-20 MED ORDER — HEPARIN (PORCINE) IN NACL 1000-0.9 UT/500ML-% IV SOLN
INTRAVENOUS | Status: AC
  Filled 2020-11-20: qty 500

## 2020-11-20 MED ORDER — PHENYLEPHRINE HCL-NACL 10-0.9 MG/250ML-% IV SOLN
INTRAVENOUS | Status: DC | PRN
  Administered 2020-11-20: 30 ug/min via INTRAVENOUS

## 2020-11-20 MED ORDER — HEPARIN SODIUM (PORCINE) 1000 UNIT/ML IJ SOLN
INTRAMUSCULAR | Status: DC | PRN
  Administered 2020-11-20: 1000 [IU] via INTRAVENOUS

## 2020-11-20 MED ORDER — ACETAMINOPHEN 325 MG PO TABS
650.0000 mg | ORAL_TABLET | ORAL | Status: DC | PRN
  Filled 2020-11-20: qty 2

## 2020-11-20 MED ORDER — HEPARIN (PORCINE) IN NACL 1000-0.9 UT/500ML-% IV SOLN
INTRAVENOUS | Status: DC | PRN
  Administered 2020-11-20 (×5): 500 mL

## 2020-11-20 MED ORDER — DOBUTAMINE IN D5W 4-5 MG/ML-% IV SOLN
INTRAVENOUS | Status: AC
  Filled 2020-11-20: qty 250

## 2020-11-20 MED ORDER — DEXAMETHASONE SODIUM PHOSPHATE 10 MG/ML IJ SOLN
INTRAMUSCULAR | Status: DC | PRN
  Administered 2020-11-20: 4 mg via INTRAVENOUS

## 2020-11-20 MED ORDER — HEPARIN SODIUM (PORCINE) 1000 UNIT/ML IJ SOLN
INTRAMUSCULAR | Status: AC
  Filled 2020-11-20: qty 1

## 2020-11-20 MED ORDER — DOBUTAMINE IN D5W 4-5 MG/ML-% IV SOLN
INTRAVENOUS | Status: DC | PRN
  Administered 2020-11-20: 20 ug/kg/min via INTRAVENOUS

## 2020-11-20 MED ORDER — SODIUM CHLORIDE 0.9% FLUSH: 3.0000 mL | INTRAVENOUS | Status: DC | PRN

## 2020-11-20 MED ORDER — LIDOCAINE 2% (20 MG/ML) 5 ML SYRINGE
INTRAMUSCULAR | Status: DC | PRN
  Administered 2020-11-20: 80 mg via INTRAVENOUS

## 2020-11-20 MED ORDER — ROCURONIUM BROMIDE 10 MG/ML (PF) SYRINGE
PREFILLED_SYRINGE | INTRAVENOUS | Status: DC | PRN
  Administered 2020-11-20: 60 mg via INTRAVENOUS

## 2020-11-20 MED ORDER — SODIUM CHLORIDE 0.9 % IV SOLN: 250.0000 mL | INTRAVENOUS | Status: DC | PRN

## 2020-11-20 MED ORDER — ONDANSETRON HCL 4 MG/2ML IJ SOLN
INTRAMUSCULAR | Status: DC | PRN
  Administered 2020-11-20: 4 mg via INTRAVENOUS

## 2020-11-20 MED ORDER — HEPARIN SODIUM (PORCINE) 1000 UNIT/ML IJ SOLN
INTRAMUSCULAR | Status: DC | PRN
  Administered 2020-11-20: 14000 [IU] via INTRAVENOUS

## 2020-11-20 MED ORDER — PROPOFOL 10 MG/ML IV BOLUS
INTRAVENOUS | Status: DC | PRN
  Administered 2020-11-20: 150 mg via INTRAVENOUS
  Administered 2020-11-20: 50 mg via INTRAVENOUS

## 2020-11-20 MED ORDER — SODIUM CHLORIDE 0.9% FLUSH: 3.0000 mL | Freq: Two times a day (BID) | INTRAVENOUS | Status: DC

## 2020-11-20 MED ORDER — SODIUM CHLORIDE 0.9 % IV SOLN: INTRAVENOUS | Status: DC

## 2020-11-20 MED ORDER — SUGAMMADEX SODIUM 200 MG/2ML IV SOLN
INTRAVENOUS | Status: DC | PRN
  Administered 2020-11-20: 200 mg via INTRAVENOUS

## 2020-11-20 MED ORDER — ONDANSETRON HCL 4 MG/2ML IJ SOLN: 4.0000 mg | Freq: Four times a day (QID) | INTRAMUSCULAR | Status: DC | PRN

## 2020-11-20 SURGICAL SUPPLY — 19 items
BAG SNAP BAND KOVER 36X36 (MISCELLANEOUS) ×2 IMPLANT
CATH 8FR REPROCESSED SOUNDSTAR (CATHETERS) ×3 IMPLANT
CATH 8FR SOUNDSTAR REPROCESSED (CATHETERS) IMPLANT
CATH MAPPNG PENTARAY F 2-6-2MM (CATHETERS) IMPLANT
CATH SMTCH THERMOCOOL SF DF (CATHETERS) ×2 IMPLANT
CATH WEB BI DIR CSDF CRV REPRO (CATHETERS) ×2 IMPLANT
CLOSURE PERCLOSE PROSTYLE (VASCULAR PRODUCTS) ×6 IMPLANT
KIT VERSACROSS STEERABLE D1 (CATHETERS) ×2 IMPLANT
PACK EP LATEX FREE (CUSTOM PROCEDURE TRAY) ×3
PACK EP LF (CUSTOM PROCEDURE TRAY) IMPLANT
PAD PRO RADIOLUCENT 2001M-C (PAD) ×2 IMPLANT
PATCH CARTO3 (PAD) ×2 IMPLANT
PENTARAY F 2-6-2MM (CATHETERS) ×3
SHEATH CARTO VIZIGO SM CVD (SHEATH) ×2 IMPLANT
SHEATH PINNACLE 7F 10CM (SHEATH) IMPLANT
SHEATH PINNACLE 8F 10CM (SHEATH) ×4 IMPLANT
SHEATH PINNACLE 9F 10CM (SHEATH) ×2 IMPLANT
SHEATH PROBE COVER 6X72 (BAG) ×2 IMPLANT
TUBING SMART ABLATE COOLFLOW (TUBING) ×2 IMPLANT

## 2020-11-20 NOTE — H&P (Signed)
Electrophysiology Office Note   Date:  11/20/2020   ID:  [name], [name] [birth year], MRN [id number]  PCP:  Shon Baton, MD  Cardiologist:  Aundra Dubin Primary Electrophysiologist:  Katrece Roediger Meredith Leeds, MD    Chief Complaint: AF   History of Present Illness: [name] [name] is a 74 y.o. male who is being seen today for the evaluation of AF at the request of No ref. provider found. Presenting today for electrophysiology evaluation.  He has a history of coronary artery disease status post CABG, mechanical AVR on Coumadin, PAD, diabetes, hypertension, hyperlipidemia, ITP, TIAs, and recurrent GI bleeds.  He has had an aortofemoral bypass in 2016 followed by iliac stenting and femoral artery endarterectomy.  In 2017 he had ostial to mid right SFA intervention.  He had history of left renal artery stenting in 2016 with repeat intervention.  He was admitted October 2020 with chest pain and was found to have severe underlying coronary disease and medical management was recommended.  He was readmitted later that month with heart failure and chest pain.  Hospital course was complicated by atrial flutter and low output heart failure.  He had orbital atherectomy and stenting of his left main.  Echo showed an ejection fraction of 30 to 35%.  Repeat echo shows normalized ejection fraction.  Today, denies symptoms of palpitations, chest pain, shortness of breath, orthopnea, PND, lower extremity edema, claudication, dizziness, presyncope, syncope, bleeding, or neurologic sequela. The patient is tolerating medications without difficulties. Plan for ablation today.   Past Medical History:  Diagnosis Date  . Adenomatous colon polyp 09/1997  . Anemia   . Aortic stenosis    s/p [facility] mechanical AVR - Chronic Coumadin  . Blood transfusion    "related to ITP"  . Coronary artery disease    s/p cabg x 3 11/2003: lima-lad, seq vg to rpda and rpl  . Diverticulitis of colon   . Heart murmur    . Hyperlipidemia   . Hypertension   . ITP (idiopathic thrombocytopenic purpura)   . Peripheral arterial disease ([place])    a. history of aortobifemoral bypass grafting by Dr. Sherren Mocha early b. LE angiography 04/22/2015 patent aortobifem graft, DES to R SFA  . Peripheral vascular disease ([place])    s/p Left external Iliac Artery stenting and subsequent left femoral endarterectomy 02/2011- post op course complicated by wound infxn req I&D 03/2011  . Renal artery stenosis, native, bilateral ([facility])    a. bilateral renal artery stenosis by recent duplex ultrasound b. L renal artery stent 02/2015, R renal artery patent on angiogram  . Stroke ([place])   . TIA (transient ischemic attack) ~ 2013  . Type II diabetes mellitus ([place])    Past Surgical History:  Procedure Laterality Date  . ABDOMINAL AORTAGRAM N/A 12/16/2011   Procedure: ABDOMINAL Maxcine Ham;  Surgeon: Sherren Mocha, MD;  Location: [facility] CATH LAB;  Service: Cardiovascular;  Laterality: N/A;  . ABDOMINAL AORTOGRAM W/LOWER EXTREMITY Right 02/29/2020   Procedure: ABDOMINAL AORTOGRAM W/LOWER EXTREMITY;  Surgeon: Lorretta Harp, MD;  Location: [place] CV LAB;  Service: Cardiovascular;  Laterality: Right;  . ANGIOPLASTY / STENTING ILIAC     Left external Iliac Artery  . AORTA - BILATERAL FEMORAL ARTERY BYPASS GRAFT  01/18/2012   Procedure: AORTA BIFEMORAL BYPASS GRAFT;  Surgeon: Curt Jews, MD;  Location: [place];  Service: Vascular;  Laterality: N/A;  . AORTIC VALVE REPLACEMENT  ~ 2004  . CARDIAC CATHETERIZATION  11/2003   /pt report 10/01/2016  .  CARDIAC VALVE REPLACEMENT  11/2003   aortic  . CARDIOVERSION N/A 10/16/2019   Procedure: CARDIOVERSION;  Surgeon: Larey Dresser, MD;  Location: [facility] ENDOSCOPY;  Service: Cardiovascular;  Laterality: N/A;  . CATARACT EXTRACTION W/ INTRAOCULAR LENS  IMPLANT, BILATERAL Bilateral   . COLONOSCOPY    . COLONOSCOPY WITH PROPOFOL N/A 09/03/2019   Procedure: COLONOSCOPY WITH PROPOFOL;  Surgeon: Doran Stabler, MD;   Location: [facility] ENDOSCOPY;  Service: Gastroenterology;  Laterality: N/A;  . CORONARY ARTERY BYPASS GRAFT  11/2003   Archie Endo 04/21/2011  . CORONARY ATHERECTOMY N/A 10/10/2019   Procedure: CORONARY ATHERECTOMY;  Surgeon: Sherren Mocha, MD;  Location: [place] CV LAB;  Service: Cardiovascular;  Laterality: N/A;  . CORONARY STENT INTERVENTION N/A 10/10/2019   Procedure: CORONARY STENT INTERVENTION;  Surgeon: Sherren Mocha, MD;  Location: [place] CV LAB;  Service: Cardiovascular;  Laterality: N/A;  . CORONARY/GRAFT ANGIOGRAPHY N/A 09/18/2019   Procedure: CORONARY/GRAFT ANGIOGRAPHY;  Surgeon: Lorretta Harp, MD;  Location: [place] CV LAB;  Service: Cardiovascular;  Laterality: N/A;  . ESOPHAGOGASTRODUODENOSCOPY (EGD) WITH PROPOFOL N/A 02/27/2019   Procedure: ESOPHAGOGASTRODUODENOSCOPY (EGD) WITH PROPOFOL;  Surgeon: Doran Stabler, MD;  Location: [place];  Service: Endoscopy;  Laterality: N/A;  . HEMOSTASIS CLIP PLACEMENT  09/03/2019   Procedure: HEMOSTASIS CLIP PLACEMENT;  Surgeon: Doran Stabler, MD;  Location: [facility] ENDOSCOPY;  Service: Gastroenterology;;  . LOWER EXTREMITY ANGIOGRAM N/A 02/21/2015   Procedure: LOWER EXTREMITY ANGIOGRAM;  Surgeon: Lorretta Harp, MD;  Location: [facility] CATH LAB;  Service: Cardiovascular;  Laterality: N/A;  . PERIPHERAL VASCULAR CATHETERIZATION N/A 04/22/2015   Procedure: Lower Extremity Angiography;  Surgeon: Lorretta Harp, MD;  Location: [place] CV LAB;  Service: Cardiovascular;  Laterality: N/A;  . PERIPHERAL VASCULAR CATHETERIZATION N/A 08/24/2016   Procedure: Lower Extremity Angiography;  Surgeon: Lorretta Harp, MD;  Location: [place] CV LAB;  Service: Cardiovascular;  Laterality: N/A;  . PERIPHERAL VASCULAR CATHETERIZATION Right 10/01/2016   Procedure: Peripheral Vascular Intervention - STENT;  Surgeon: Lorretta Harp, MD;  Location: [place] CV LAB;  Service: Cardiovascular;  Laterality: Right;  Prox and MID SFA   . PERIPHERAL  VASCULAR INTERVENTION  02/29/2020   Procedure: PERIPHERAL VASCULAR INTERVENTION;  Surgeon: Lorretta Harp, MD;  Location: [place] CV LAB;  Service: Cardiovascular;;  Right SFA  . POLYPECTOMY    . RENAL ANGIOGRAM N/A 02/21/2015   Procedure: RENAL ANGIOGRAM;  Surgeon: Lorretta Harp, MD;  Location: [facility] CATH LAB;  Service: Cardiovascular;  Laterality: Bilateral; 6 mm x 12 mm long Herculink balloon expandable stent to the left renal artery  . RENAL ARTERY STENT Left 04/22/2015   dr berry  . SPLENECTOMY  02/2003   Archie Endo 04/21/2011  . TEE WITHOUT CARDIOVERSION N/A 10/16/2019   Procedure: TRANSESOPHAGEAL ECHOCARDIOGRAM (TEE);  Surgeon: Larey Dresser, MD;  Location: [facility] ENDOSCOPY;  Service: Cardiovascular;  Laterality: N/A;  . TEMPORARY PACEMAKER N/A 10/10/2019   Procedure: TEMPORARY PACEMAKER;  Surgeon: Sherren Mocha, MD;  Location: [place] CV LAB;  Service: Cardiovascular;  Laterality: N/A;  . TONSILLECTOMY  ~ 1952     Current Facility-Administered Medications  Medication Dose Route Frequency Provider Last Rate Last Admin  . 0.9 %  sodium chloride infusion   Intravenous Continuous Armas Mcbee, Ocie Doyne, MD        Allergies:   Patient has no known allergies.   Social History:  The patient  reports that he quit smoking about 26 years ago. His smoking  use included cigarettes and cigars. He quit after 30.00 years of use. He has never used smokeless tobacco. He reports current alcohol use of about 16.0 standard drinks of alcohol per week. He reports that he does not use drugs.   Family History:  The patient's family history includes Breast cancer in his sister; Coronary artery disease in his mother; Diabetes in his paternal aunt, paternal grandmother, and another family member; Heart disease in his father.   ROS:  Please see the history of present illness.   Otherwise, review of systems is positive for none.   All other systems are reviewed and negative.   PHYSICAL EXAM: VS:  BP (!)  152/49   Pulse (P) 74   Ht (P) 5\' 8"  (1.727 m)   Wt (P) 78 kg   SpO2 (P) 96%   BMI (P) 26.15 kg/m  , BMI Body mass index is 26.15 kg/m (pended). GEN: Well nourished, well developed, in no acute distress  HEENT: normal  Neck: no JVD, carotid bruits, or masses Cardiac: RRR; no murmurs, rubs, or gallops,no edema  Respiratory:  clear to auscultation bilaterally, normal work of breathing GI: soft, nontender, nondistended, + BS MS: no deformity or atrophy  Skin: warm and dry Neuro:  Strength and sensation are intact Psych: euthymic mood, full affect  Recent Labs: 03/05/2020: B Natriuretic Peptide 457.7 09/30/2020: ALT 17; TSH 1.970 11/04/2020: BUN 20; Creatinine, Ser 1.20; Hemoglobin 15.9; Platelets 216; Potassium 4.0; Sodium 140    Lipid Panel     Component Value Date/Time   CHOL 154 05/30/2020 1214   TRIG 47 05/30/2020 1214   HDL 101 05/30/2020 1214   CHOLHDL 1.5 05/30/2020 1214   VLDL 9 05/30/2020 1214   LDLCALC 44 05/30/2020 1214     Wt Readings from Last 3 Encounters:  11/20/20 (P) 78 kg  10/01/20 79.8 kg  09/30/20 79.8 kg      Other studies Reviewed: Additional studies/ records that were reviewed today include: TTE 05/30/20  Review of the above records today demonstrates:  1. Left ventricular ejection fraction, by estimation, is 55 to 60%. The  left ventricle has normal function. The left ventricle has no regional  wall motion abnormalities. There is mild left ventricular hypertrophy.  Left ventricular diastolic parameters  are consistent with Grade II diastolic dysfunction (pseudonormalization).  2. Right ventricular systolic function is normal. The right ventricular  size is normal. Tricuspid regurgitation signal is inadequate for assessing  PA pressure.  3. The mitral valve is normal in structure. Trivial mitral valve  regurgitation. No evidence of mitral stenosis.  4. Mechanical aortic valve with no significant stenosis (mean gradient 9)  and trivial  regurgitation.  5. The inferior vena cava is normal in size with greater than 50%  respiratory variability, suggesting right atrial pressure of 3 mmHg.    ASSESSMENT AND PLAN:  1.  Persistent atrial fibrillation/flutter: [name] [name] has presented today for surgery, with the diagnosis of atrial fibrillation.  The various methods of treatment have been discussed with the patient and family. After consideration of risks, benefits and other options for treatment, the patient has consented to  Procedure(s): Catheter ablation as a surgical intervention .  Risks include but not limited to complete heart block, stroke, esophageal damage, nerve damage, bleeding, vascular damage, tamponade, perforation, MI, and death. The patient's history has been reviewed, patient examined, no change in status, stable for surgery.  I have reviewed the patient's chart and labs.  Questions were answered to the patient's  satisfaction.    [name] [name] Curt Bears, MD 11/20/2020 10:38 AM

## 2020-11-20 NOTE — Anesthesia Postprocedure Evaluation (Signed)
Anesthesia Post Note  Patient: [name] [name]  Procedure(s) Performed: ATRIAL FIBRILLATION ABLATION (N/A )     Patient location during evaluation: Cath Lab Anesthesia Type: General Level of consciousness: awake Pain management: pain level controlled Vital Signs Assessment: post-procedure vital signs reviewed and stable Respiratory status: spontaneous breathing, nonlabored ventilation, respiratory function stable and patient connected to nasal cannula oxygen Cardiovascular status: blood pressure returned to baseline and stable Postop Assessment: no apparent nausea or vomiting Anesthetic complications: no   No complications documented.  Last Vitals:  Vitals:   11/20/20 1921 11/20/20 1951  BP: (!) 116/40 (!) 133/41  Pulse: 74 (!) 40  Resp: 20 (!) 23  Temp:    SpO2: 92% 92%    Last Pain:  Vitals:   11/20/20 1921  TempSrc:   PainSc: 0-No pain                 Randen Kauth P Sanuel Ladnier

## 2020-11-20 NOTE — Anesthesia Procedure Notes (Signed)
Procedure Name: Intubation Performed by: Milford Cage, CRNA Pre-anesthesia Checklist: Patient identified, Emergency Drugs available, Suction available and Patient being monitored Patient Re-evaluated:Patient Re-evaluated prior to induction Oxygen Delivery Method: Circle System Utilized Preoxygenation: Pre-oxygenation with 100% oxygen Induction Type: IV induction Ventilation: Mask ventilation without difficulty Laryngoscope Size: Glidescope and 4 Grade View: Grade I Tube type: Oral Tube size: 7.0 mm Number of attempts: 3 Airway Equipment and Method: Stylet and Oral airway Placement Confirmation: ETT inserted through vocal cords under direct vision,  positive ETCO2 and breath sounds checked- equal and bilateral Secured at: 23 cm Tube secured with: Tape Dental Injury: Teeth and Oropharynx as per pre-operative assessment  Difficulty Due To: Difficulty was unanticipated and Difficult Airway- due to anterior larynx Future Recommendations: Recommend- induction with short-acting agent, and alternative techniques readily available Comments: MAC3 G3 view. Mil2 G3 view. Glidescope 4 G1 view - Successful intubation

## 2020-11-20 NOTE — Anesthesia Preprocedure Evaluation (Addendum)
Anesthesia Evaluation  Patient identified by MRN, date of birth, ID band Patient awake    Reviewed: Allergy & Precautions, NPO status , Patient's Chart, lab work & pertinent test results  Airway Mallampati: II  TM Distance: >3 FB Neck ROM: Full    Dental  (+) Teeth Intact, Dental Advisory Given, Partial Upper   Pulmonary former smoker,    breath sounds clear to auscultation       Cardiovascular hypertension, + Past MI, + CABG and + Peripheral Vascular Disease  + dysrhythmias Atrial Fibrillation + Valvular Problems/Murmurs  Rhythm:Irregular Rate:Normal  - s/p CABG, AVR   Neuro/Psych TIACVA    GI/Hepatic negative GI ROS, Neg liver ROS,   Endo/Other  diabetes, Type 2, Oral Hypoglycemic Agents  Renal/GU negative Renal ROS     Musculoskeletal negative musculoskeletal ROS (+)   Abdominal Normal abdominal exam  (+)   Peds  Hematology negative hematology ROS (+)   Anesthesia Other Findings - HLD  Reproductive/Obstetrics                            Anesthesia Physical Anesthesia Plan  ASA: III  Anesthesia Plan: General   Post-op Pain Management:    Induction: Intravenous  PONV Risk Score and Plan: 3 and Ondansetron and Treatment may vary due to age or medical condition  Airway Management Planned: Oral ETT  Additional Equipment: None  Intra-op Plan:   Post-operative Plan: Extubation in OR  Informed Consent: I have reviewed the patients History and Physical, chart, labs and discussed the procedure including the risks, benefits and alternatives for the proposed anesthesia with the patient or authorized representative who has indicated his/her understanding and acceptance.       Plan Discussed with: CRNA  Anesthesia Plan Comments: (Echo:  1. Left ventricular ejection fraction, by estimation, is 55 to 60%. The  left ventricle has normal function. The left ventricle has no regional   wall motion abnormalities. There is mild left ventricular hypertrophy.  Left ventricular diastolic parameters  are consistent with Grade II diastolic dysfunction (pseudonormalization).  2. Right ventricular systolic function is normal. The right ventricular  size is normal. Tricuspid regurgitation signal is inadequate for assessing  PA pressure.  3. The mitral valve is normal in structure. Trivial mitral valve  regurgitation. No evidence of mitral stenosis.  4. Mechanical aortic valve with no significant stenosis (mean gradient 9)  and trivial regurgitation.  5. The inferior vena cava is normal in size with greater than 50%  respiratory variability, suggesting right atrial pressure of 3 mmHg. )       Anesthesia Quick Evaluation

## 2020-11-20 NOTE — Progress Notes (Signed)
Dr Curt Bears in to see client and notified of telemetry showing irregular heart rate with PAC's and first degree heart block and bradycardia rate 37; client without symptoms and per Dr Curt Bears ok to d/c home after bedrest

## 2020-11-20 NOTE — Discharge Instructions (Signed)
Femoral Sites Care  Drink plenty of fluids for 24 hours  This sheet gives you information about how to care for yourself after your procedure. Your health care provider may also give you more specific instructions. If you have problems or questions, contact your health care provider. What can I expect after the procedure? After the procedure, it is common to have:  Bruising that usually fades within 1-2 weeks.  Tenderness at the site. Follow these instructions at home: Wound care  Follow instructions from your health care provider about how to take care of your insertion site. Make sure you: ? Wash your hands with soap and water before you change your bandage (dressing). If soap and water are not available, use hand sanitizer. ? Change your dressing as told by your health care provider. ?   Do not take baths, swim, or use a hot tub until groin sites heal  You may shower 24 hours after the procedure or as told by your health care provider. ? Gently wash the site with plain soap and water. ? Pat the area dry with a clean towel. ? Do not rub the site. This may cause bleeding.  Do not apply powder or lotion to the site. Keep the site clean and dry.  Check your femoral site every day for signs of infection. Check for: ? Redness, swelling, or pain. ? Fluid or blood. ? Warmth. ? Pus or a bad smell. Activity  For the first 2-3 days after your procedure, or as long as directed: ? Avoid climbing stairs as much as possible. ? Do not squat.  Do not lift anything that is heavier than 10 lb (4.5 kg), or the limit that you are told, until your health care provider says that it is safe.  Rest as directed. ? Avoid sitting for a long time without moving. Get up to take short walks every 1-2 hours.  Do not drive for 24 hours if you were given a medicine to help you relax (sedative). General instructions  Take over-the-counter and prescription medicines only as told by your health care  provider.  Keep all follow-up visits as told by your health care provider. This is important. Contact a health care provider if you have:  A fever or chills.  You have redness, swelling, or pain around your insertion site. Get help right away if:  The catheter insertion area swells very fast.  You pass out.  You suddenly start to sweat or your skin gets clammy.  The catheter insertion area is bleeding, and the bleeding does not stop when you hold steady pressure on the area.  The area near or just beyond the catheter insertion site becomes pale, cool, tingly, or numb. These symptoms may represent a serious problem that is an emergency. Do not wait to see if the symptoms will go away. Get medical help right away. Call your local emergency services (911 in the [place] Do not drive yourself to the hospital. Summary  After the procedure, it is common to have bruising that usually fades within 1-2 weeks.  Check your femoral site every day for signs of infection.  Do not lift anything that is heavier than 10 lb (4.5 kg), or the limit that you are told, until your health care provider says that it is safe. This information is not intended to replace advice given to you by your health care provider. Make sure you discuss any questions you have with your health care provider. Document Revised: 12/06/2017 Document  Reviewed: 12/06/2017 Elsevier Patient Education  [employer].

## 2020-11-20 NOTE — Transfer of Care (Signed)
Immediate Anesthesia Transfer of Care Note  Patient: [name] [name]  Procedure(s) Performed: ATRIAL FIBRILLATION ABLATION (N/A )  Patient Location: PACU  Anesthesia Type:General  Level of Consciousness: awake  Airway & Oxygen Therapy: Patient Spontanous Breathing and Patient connected to nasal cannula oxygen  Post-op Assessment: Report given to RN and Post -op Vital signs reviewed and stable  Post vital signs: Reviewed and stable  Last Vitals:  Vitals Value Taken Time  BP    Temp    Pulse 69 11/20/20 1721  Resp 18 11/20/20 1721  SpO2 93 % 11/20/20 1721  Vitals shown include unvalidated device data.  Last Pain:  Vitals:   11/20/20 1059  TempSrc:   PainSc: 0-No pain         Complications: No complications documented.

## 2020-11-20 NOTE — Progress Notes (Signed)
Up and walked and tolerated well; bilat groins stable, no bleeding or hematoma

## 2020-11-21 ENCOUNTER — Encounter (HOSPITAL_COMMUNITY): Payer: Self-pay | Admitting: Cardiology

## 2020-11-25 DIAGNOSIS — D225 Melanocytic nevi of trunk: Secondary | ICD-10-CM | POA: Diagnosis not present

## 2020-11-25 DIAGNOSIS — D1801 Hemangioma of skin and subcutaneous tissue: Secondary | ICD-10-CM | POA: Diagnosis not present

## 2020-11-25 DIAGNOSIS — L570 Actinic keratosis: Secondary | ICD-10-CM | POA: Diagnosis not present

## 2020-11-25 DIAGNOSIS — Z85828 Personal history of other malignant neoplasm of skin: Secondary | ICD-10-CM | POA: Diagnosis not present

## 2020-11-25 DIAGNOSIS — L918 Other hypertrophic disorders of the skin: Secondary | ICD-10-CM | POA: Diagnosis not present

## 2020-11-27 ENCOUNTER — Ambulatory Visit (INDEPENDENT_AMBULATORY_CARE_PROVIDER_SITE_OTHER): Payer: Medicare Other | Admitting: *Deleted

## 2020-11-27 ENCOUNTER — Other Ambulatory Visit: Payer: Self-pay

## 2020-11-27 DIAGNOSIS — I359 Nonrheumatic aortic valve disorder, unspecified: Secondary | ICD-10-CM

## 2020-11-27 DIAGNOSIS — Z5181 Encounter for therapeutic drug level monitoring: Secondary | ICD-10-CM

## 2020-11-27 LAB — POCT INR: INR: 2.1 (ref 2.0–3.0)

## 2020-11-27 NOTE — Patient Instructions (Signed)
Description   Take 1.5 tablets today then start taking 1 tablet daily except for 1.5 tablets on Sundays, Tuesdays and Thursdays. Recheck INR in 2 weeks. Coumadin Clinic for any changes in medications or upcoming procedures. [phone]

## 2020-12-07 ENCOUNTER — Emergency Department (HOSPITAL_COMMUNITY): Payer: Medicare Other

## 2020-12-07 ENCOUNTER — Encounter (HOSPITAL_COMMUNITY): Payer: Self-pay

## 2020-12-07 ENCOUNTER — Inpatient Hospital Stay (HOSPITAL_COMMUNITY)
Admission: EM | Admit: 2020-12-07 | Discharge: 2020-12-09 | DRG: 291 | Disposition: A | Discharge status: Home / Self Care | Payer: Medicare Other | Attending: Internal Medicine | Admitting: Internal Medicine

## 2020-12-07 ENCOUNTER — Other Ambulatory Visit: Payer: Self-pay

## 2020-12-07 DIAGNOSIS — Z79899 Other long term (current) drug therapy: Secondary | ICD-10-CM

## 2020-12-07 DIAGNOSIS — E119 Type 2 diabetes mellitus without complications: Secondary | ICD-10-CM

## 2020-12-07 DIAGNOSIS — J449 Chronic obstructive pulmonary disease, unspecified: Secondary | ICD-10-CM | POA: Diagnosis present

## 2020-12-07 DIAGNOSIS — Z833 Family history of diabetes mellitus: Secondary | ICD-10-CM

## 2020-12-07 DIAGNOSIS — Z7902 Long term (current) use of antithrombotics/antiplatelets: Secondary | ICD-10-CM

## 2020-12-07 DIAGNOSIS — Z951 Presence of aortocoronary bypass graft: Secondary | ICD-10-CM

## 2020-12-07 DIAGNOSIS — R0789 Other chest pain: Secondary | ICD-10-CM | POA: Diagnosis not present

## 2020-12-07 DIAGNOSIS — I509 Heart failure, unspecified: Secondary | ICD-10-CM

## 2020-12-07 DIAGNOSIS — R0602 Shortness of breath: Secondary | ICD-10-CM | POA: Diagnosis not present

## 2020-12-07 DIAGNOSIS — Z7901 Long term (current) use of anticoagulants: Secondary | ICD-10-CM

## 2020-12-07 DIAGNOSIS — I2510 Atherosclerotic heart disease of native coronary artery without angina pectoris: Secondary | ICD-10-CM | POA: Diagnosis present

## 2020-12-07 DIAGNOSIS — Z955 Presence of coronary angioplasty implant and graft: Secondary | ICD-10-CM

## 2020-12-07 DIAGNOSIS — Z8673 Personal history of transient ischemic attack (TIA), and cerebral infarction without residual deficits: Secondary | ICD-10-CM

## 2020-12-07 DIAGNOSIS — E1151 Type 2 diabetes mellitus with diabetic peripheral angiopathy without gangrene: Secondary | ICD-10-CM | POA: Diagnosis present

## 2020-12-07 DIAGNOSIS — R0902 Hypoxemia: Secondary | ICD-10-CM | POA: Diagnosis not present

## 2020-12-07 DIAGNOSIS — Z7984 Long term (current) use of oral hypoglycemic drugs: Secondary | ICD-10-CM

## 2020-12-07 DIAGNOSIS — N179 Acute kidney failure, unspecified: Secondary | ICD-10-CM

## 2020-12-07 DIAGNOSIS — I5033 Acute on chronic diastolic (congestive) heart failure: Secondary | ICD-10-CM

## 2020-12-07 DIAGNOSIS — Z803 Family history of malignant neoplasm of breast: Secondary | ICD-10-CM

## 2020-12-07 DIAGNOSIS — R0689 Other abnormalities of breathing: Secondary | ICD-10-CM | POA: Diagnosis not present

## 2020-12-07 DIAGNOSIS — J9601 Acute respiratory failure with hypoxia: Secondary | ICD-10-CM | POA: Diagnosis present

## 2020-12-07 DIAGNOSIS — Z87891 Personal history of nicotine dependence: Secondary | ICD-10-CM

## 2020-12-07 DIAGNOSIS — I110 Hypertensive heart disease with heart failure: Principal | ICD-10-CM | POA: Diagnosis present

## 2020-12-07 DIAGNOSIS — Z8249 Family history of ischemic heart disease and other diseases of the circulatory system: Secondary | ICD-10-CM

## 2020-12-07 DIAGNOSIS — R079 Chest pain, unspecified: Secondary | ICD-10-CM | POA: Diagnosis not present

## 2020-12-07 DIAGNOSIS — Z20822 Contact with and (suspected) exposure to covid-19: Secondary | ICD-10-CM | POA: Diagnosis present

## 2020-12-07 DIAGNOSIS — E785 Hyperlipidemia, unspecified: Secondary | ICD-10-CM | POA: Diagnosis present

## 2020-12-07 DIAGNOSIS — I160 Hypertensive urgency: Secondary | ICD-10-CM

## 2020-12-07 DIAGNOSIS — R911 Solitary pulmonary nodule: Secondary | ICD-10-CM

## 2020-12-07 DIAGNOSIS — Z952 Presence of prosthetic heart valve: Secondary | ICD-10-CM

## 2020-12-07 HISTORY — DX: Cardiac arrhythmia, unspecified: I49.9

## 2020-12-07 HISTORY — DX: Chronic obstructive pulmonary disease, unspecified: J44.9

## 2020-12-07 HISTORY — DX: Heart failure, unspecified: I50.9

## 2020-12-07 MED ORDER — NITROGLYCERIN IN D5W 200-5 MCG/ML-% IV SOLN
0.0000 ug/min | INTRAVENOUS | Status: DC
  Administered 2020-12-08: 5 ug/min via INTRAVENOUS
  Administered 2020-12-09: 40 ug/min via INTRAVENOUS
  Filled 2020-12-07 (×2): qty 250

## 2020-12-07 MED ORDER — FUROSEMIDE 10 MG/ML IJ SOLN
40.0000 mg | Freq: Once | INTRAMUSCULAR | Status: AC
  Administered 2020-12-08: 40 mg via INTRAVENOUS
  Filled 2020-12-07: qty 4

## 2020-12-07 NOTE — ED Provider Notes (Signed)
[place] EMERGENCY DEPARTMENT Provider Note   CSN: [id number] Arrival date & time: 12/07/20  2331     History Chief Complaint  Patient presents with  . Chest Pain  . Shortness of Breath    [name] [name] is a 75 y.o. male.  HPI   75 year old male with a history of aortic stenosis s/p mechanical AVR on chronic Coumadin, CAD s/p CABG x3, diverticulitis, hyperlipidemia, hypertension, PAD, peripheral vascular disease, renal artery stenosis, stroke, diabetes, who presents to the emergency department today for evaluation of shortness of breath and chest pressure.  States that symptoms started around 4 PM today.  Chest pressure located to the center of the chest and became short of breath.  Also diaphoretic.  States he has had some congestion.  In route with EMS he was noted to be in A. fib with a heart rate of 110-135.  He was also hypoxic at 90% on room air and was placed on 4 L O2 sats increased to 98%.  They were unable to obtain a blood pressure with cough and route and were only able to palpate a blood pressure which was noted to be elevated at 144 systolic.  After nitroglycerin symptoms somewhat improved and blood pressure improved to the 315 systolic. He is on Coumadin/Plavix.  He had an ablation 2 weeks ago.  Past Medical History:  Diagnosis Date  . Adenomatous colon polyp 09/1997  . Anemia   . Aortic stenosis    s/p st. jude mechanical AVR - Chronic Coumadin  . Blood transfusion    "related to ITP"  . Coronary artery disease    s/p cabg x 3 11/2003: lima-lad, seq vg to rpda and rpl  . Diverticulitis of colon   . Heart murmur   . Hyperlipidemia   . Hypertension   . ITP (idiopathic thrombocytopenic purpura)   . Peripheral arterial disease ([place])    a. history of aortobifemoral bypass grafting by Dr. Sherren Mocha early b. LE angiography 04/22/2015 patent aortobifem graft, DES to R SFA  . Peripheral vascular disease ([place])    s/p Left external Iliac Artery  stenting and subsequent left femoral endarterectomy 02/2011- post op course complicated by wound infxn req I&D 03/2011  . Renal artery stenosis, native, bilateral (HCC)    a. bilateral renal artery stenosis by recent duplex ultrasound b. L renal artery stent 02/2015, R renal artery patent on angiogram  . Stroke ([place])   . TIA (transient ischemic attack) ~ 2013  . Type II diabetes mellitus [facility])     Patient Active Problem List   Diagnosis Date Noted  . Claudication in peripheral vascular disease ([place]) 02/29/2020  . Paroxysmal atrial fibrillation ([place]) 10/30/2019  . Acute systolic heart failure ([place]) 10/06/2019  . Acute diastolic heart failure ([place]) 09/19/2019  . History of GI bleed 09/19/2019  . Unstable angina ([place]) 09/17/2019  . NSTEMI (non-ST elevated myocardial infarction) ([place]) 09/16/2019  . H/O colonoscopy with polypectomy   . Hematochezia   . GI bleed 09/02/2019  . Supratherapeutic INR 02/23/2019  . Gastrointestinal hemorrhage with melena 02/23/2019  . Acute blood loss anemia 02/23/2019  . Symptomatic anemia 02/23/2019  . TIA (transient ischemic attack) 12/20/2017  . Stroke-like symptoms 12/14/2017  . Claudication ([place]) 10/01/2016  . Renal artery arteriosclerosis ([place]) 02/22/2015  . Renal artery stenosis ([place]) 02/21/2015  . Renal artery stenosis, native, bilateral ([place]) 02/08/2015  . H/O mechanical aortic valve replacement 07/20/2014  . Encounter for therapeutic drug monitoring 03/13/2014  . Cerebral infarction (  [place]) 05/05/2013  . Aortoiliac occlusive disease ([place]) 01/10/2013  . PVD (peripheral vascular disease) with claudication ([place]) 02/02/2012  . Atherosclerosis of native arteries of extremity with intermittent claudication ([place]) 01/12/2012  . Coronary artery disease   . Aortic stenosis   . Peripheral vascular disease ([place])   . Hypertension   . Hyperlipidemia   . ITP (idiopathic thrombocytopenic purpura)   . Coronary atherosclerosis 10/21/2010  . Transient cerebral  ischemia 10/10/2009  . Diabetes ([place]) 10/22/2008  . HYPERCHOLESTEROLEMIA 10/22/2008  . Immune thrombocytopenic purpura ([place]) 10/22/2008  . Essential hypertension 10/22/2008  . Aortic valve disorder 10/22/2008  . PVD (peripheral vascular disease) ([place]) 10/22/2008  . DIVERTICULITIS, COLON 10/22/2008  . COLONIC POLYPS, HX OF 10/22/2008    Past Surgical History:  Procedure Laterality Date  . ABDOMINAL AORTAGRAM N/A 12/16/2011   Procedure: ABDOMINAL Maxcine Ham;  Surgeon: Sherren Mocha, MD;  Location: [facility] CATH LAB;  Service: Cardiovascular;  Laterality: N/A;  . ABDOMINAL AORTOGRAM W/LOWER EXTREMITY Right 02/29/2020   Procedure: ABDOMINAL AORTOGRAM W/LOWER EXTREMITY;  Surgeon: Lorretta Harp, MD;  Location: [name] CV LAB;  Service: Cardiovascular;  Laterality: Right;  . ANGIOPLASTY / STENTING ILIAC     Left external Iliac Artery  . AORTA - BILATERAL FEMORAL ARTERY BYPASS GRAFT  01/18/2012   Procedure: AORTA BIFEMORAL BYPASS GRAFT;  Surgeon: Curt Jews, MD;  Location: [place];  Service: Vascular;  Laterality: N/A;  . AORTIC VALVE REPLACEMENT  ~ 2004  . ATRIAL FIBRILLATION ABLATION N/A 11/20/2020   Procedure: ATRIAL FIBRILLATION ABLATION;  Surgeon: Constance Haw, MD;  Location: [place] CV LAB;  Service: Cardiovascular;  Laterality: N/A;  . CARDIAC CATHETERIZATION  11/2003   /pt report 10/01/2016  . CARDIAC VALVE REPLACEMENT  11/2003   aortic  . CARDIOVERSION N/A 10/16/2019   Procedure: CARDIOVERSION;  Surgeon: Larey Dresser, MD;  Location: [facility] ENDOSCOPY;  Service: Cardiovascular;  Laterality: N/A;  . CATARACT EXTRACTION W/ INTRAOCULAR LENS  IMPLANT, BILATERAL Bilateral   . COLONOSCOPY    . COLONOSCOPY WITH PROPOFOL N/A 09/03/2019   Procedure: COLONOSCOPY WITH PROPOFOL;  Surgeon: Doran Stabler, MD;  Location: [facility] ENDOSCOPY;  Service: Gastroenterology;  Laterality: N/A;  . CORONARY ARTERY BYPASS GRAFT  11/2003   Archie Endo 04/21/2011  . CORONARY ATHERECTOMY N/A 10/10/2019    Procedure: CORONARY ATHERECTOMY;  Surgeon: Sherren Mocha, MD;  Location: [place] CV LAB;  Service: Cardiovascular;  Laterality: N/A;  . CORONARY STENT INTERVENTION N/A 10/10/2019   Procedure: CORONARY STENT INTERVENTION;  Surgeon: Sherren Mocha, MD;  Location: [place] CV LAB;  Service: Cardiovascular;  Laterality: N/A;  . CORONARY/GRAFT ANGIOGRAPHY N/A 09/18/2019   Procedure: CORONARY/GRAFT ANGIOGRAPHY;  Surgeon: Lorretta Harp, MD;  Location: [place] CV LAB;  Service: Cardiovascular;  Laterality: N/A;  . ESOPHAGOGASTRODUODENOSCOPY (EGD) WITH PROPOFOL N/A 02/27/2019   Procedure: ESOPHAGOGASTRODUODENOSCOPY (EGD) WITH PROPOFOL;  Surgeon: Doran Stabler, MD;  Location: [place];  Service: Endoscopy;  Laterality: N/A;  . HEMOSTASIS CLIP PLACEMENT  09/03/2019   Procedure: HEMOSTASIS CLIP PLACEMENT;  Surgeon: Doran Stabler, MD;  Location: [facility] ENDOSCOPY;  Service: Gastroenterology;;  . LOWER EXTREMITY ANGIOGRAM N/A 02/21/2015   Procedure: LOWER EXTREMITY ANGIOGRAM;  Surgeon: Lorretta Harp, MD;  Location: [facility] CATH LAB;  Service: Cardiovascular;  Laterality: N/A;  . PERIPHERAL VASCULAR CATHETERIZATION N/A 04/22/2015   Procedure: Lower Extremity Angiography;  Surgeon: Lorretta Harp, MD;  Location: [place] CV LAB;  Service: Cardiovascular;  Laterality: N/A;  . PERIPHERAL VASCULAR CATHETERIZATION N/A 08/24/2016  Procedure: Lower Extremity Angiography;  Surgeon: Lorretta Harp, MD;  Location: [place] CV LAB;  Service: Cardiovascular;  Laterality: N/A;  . PERIPHERAL VASCULAR CATHETERIZATION Right 10/01/2016   Procedure: Peripheral Vascular Intervention - STENT;  Surgeon: Lorretta Harp, MD;  Location: [place] CV LAB;  Service: Cardiovascular;  Laterality: Right;  Prox and MID SFA   . PERIPHERAL VASCULAR INTERVENTION  02/29/2020   Procedure: PERIPHERAL VASCULAR INTERVENTION;  Surgeon: Lorretta Harp, MD;  Location: [place] CV LAB;  Service: Cardiovascular;;   Right SFA  . POLYPECTOMY    . RENAL ANGIOGRAM N/A 02/21/2015   Procedure: RENAL ANGIOGRAM;  Surgeon: Lorretta Harp, MD;  Location: [facility] CATH LAB;  Service: Cardiovascular;  Laterality: Bilateral; 6 mm x 12 mm long Herculink balloon expandable stent to the left renal artery  . RENAL ARTERY STENT Left 04/22/2015   dr berry  . SPLENECTOMY  02/2003   Archie Endo 04/21/2011  . TEE WITHOUT CARDIOVERSION N/A 10/16/2019   Procedure: TRANSESOPHAGEAL ECHOCARDIOGRAM (TEE);  Surgeon: Larey Dresser, MD;  Location: [facility] ENDOSCOPY;  Service: Cardiovascular;  Laterality: N/A;  . TEMPORARY PACEMAKER N/A 10/10/2019   Procedure: TEMPORARY PACEMAKER;  Surgeon: Sherren Mocha, MD;  Location: [place] CV LAB;  Service: Cardiovascular;  Laterality: N/A;  . TONSILLECTOMY  ~ 1952       Family History  Problem Relation Age of Onset  . Coronary artery disease Mother        bypass surgery - deceased  . Heart disease Father        murmur, valve replacement - deceased  . Breast cancer Sister   . Diabetes Other        grandmother  . Diabetes Paternal Grandmother   . Diabetes Paternal Aunt   . Colon cancer Neg Hx   . Colon polyps Neg Hx   . Esophageal cancer Neg Hx   . Rectal cancer Neg Hx   . Stomach cancer Neg Hx     Social History   Tobacco Use  . Smoking status: Former Smoker    Years: 30.00    Types: Cigarettes, Cigars    Quit date: 12/15/1993    Years since quitting: 27.0  . Smokeless tobacco: Never Used  . Tobacco comment: occasional cigar  Vaping Use  . Vaping Use: Never used  Substance Use Topics  . Alcohol use: Yes    Alcohol/week: 16.0 standard drinks    Types: 1 Cans of beer, 1 Shots of liquor, 14 Standard drinks or equivalent per week    Comment: drinks 2 martini's a night (2 shots in each)  . Drug use: No    Home Medications Prior to Admission medications   Medication Sig Start Date End Date Taking? Authorizing Provider  amiodarone (PACERONE) 100 MG tablet Take 1 tablet (100 mg  total) by mouth daily. 02/23/20   Larey Dresser, MD  amoxicillin (AMOXIL) 500 MG capsule Take 2,000 mg by mouth See admin instructions. Take 2000 mg by mouth 30-60 minutes before dental appointment 06/08/19   [provider]  atorvastatin (LIPITOR) 80 MG tablet Take 80 mg by mouth daily.    [provider]  clopidogrel (PLAVIX) 75 MG tablet TAKE 1 TABLET BY MOUTH EVERY DAY 04/01/20   Larey Dresser, MD  ENTRESTO 24-26 MG TAKE 1 TABLET BY MOUTH TWICE A DAY Patient taking differently: Take 1 tablet by mouth 2 (two) times daily. 05/22/20   Larey Dresser, MD  Evolocumab 140 MG/ML SOAJ Inject 140 mg  into the skin every 14 (fourteen) days.    [provider]  furosemide (LASIX) 20 MG tablet Take 1 tablet (20 mg total) by mouth daily. 07/03/20   Bensimhon, Shaune Pascal, MD  glyBURIDE (DIABETA) 2.5 MG tablet Take 2.5 mg by mouth 2 (two) times daily with a meal.     [provider]  INVOKANA 100 MG TABS tablet Take 100 mg by mouth daily. 02/26/20   [provider]  nitroGLYCERIN (NITROSTAT) 0.4 MG SL tablet Place 1 tablet (0.4 mg total) under the tongue every 5 (five) minutes x 3 doses as needed for chest pain. 09/19/19   Daune Perch, NP  warfarin (COUMADIN) 5 MG tablet TAKE AS DIRECTED BY COUMADIN CLINIC Patient taking differently: Take 5-7.5 mg by mouth See admin instructions. Take 7.5 mg on Sun and Thurs, Take 5 mg on Mon, Tues, Wed, Fri, and Sat 10/02/20   Josue Hector, MD    Allergies    Patient has no known allergies.  Review of Systems   Review of Systems  Constitutional: Positive for diaphoresis. Negative for chills and fever.  HENT: Negative for ear pain and sore throat.   Eyes: Negative for visual disturbance.  Respiratory: Positive for shortness of breath.   Cardiovascular: Positive for chest pain. Negative for palpitations.  Gastrointestinal: Negative for abdominal pain, constipation, diarrhea, nausea and vomiting.  Genitourinary: Negative  for dysuria and hematuria.  Musculoskeletal: Negative for back pain.  Skin: Negative for color change and rash.  Neurological: Negative for seizures and syncope.  All other systems reviewed and are negative.   Physical Exam Updated Vital Signs BP (!) 147/47   Pulse (!) 42   Temp 98.4 F (36.9 C) (Rectal)   Resp 17   Ht 5\' 8"  (1.727 m)   Wt 78.9 kg   SpO2 93%   BMI 26.46 kg/m   Physical Exam Vitals and nursing note reviewed.  Constitutional:      Appearance: He is well-developed and well-nourished. He is ill-appearing and diaphoretic.  HENT:     Head: Normocephalic and atraumatic.  Eyes:     Conjunctiva/sclera: Conjunctivae normal.  Cardiovascular:     Heart sounds: No murmur heard.     Comments: Irregularly irregular Pulmonary:     Effort: Pulmonary effort is normal. No respiratory distress.     Breath sounds: Examination of the right-lower field reveals rales. Examination of the left-lower field reveals rales. Decreased breath sounds and rales present.  Abdominal:     Palpations: Abdomen is soft.     Tenderness: There is no abdominal tenderness.  Musculoskeletal:     Cervical back: Neck supple.     Right lower leg: No tenderness. Edema present.     Left lower leg: No tenderness. Edema present.  Skin:    General: Skin is warm.  Neurological:     Mental Status: He is alert.  Psychiatric:        Mood and Affect: Mood and affect normal.     ED Results / Procedures / Treatments   Labs (all labs ordered are listed, but only abnormal results are displayed) Labs Reviewed  CBC WITH DIFFERENTIAL/PLATELET - Abnormal; Notable for the following components:      Result Value   WBC 10.8 (*)    Monocytes Absolute 1.4 (*)    All other components within normal limits  BRAIN NATRIURETIC PEPTIDE - Abnormal; Notable for the following components:   B Natriuretic Peptide 153.7 (*)    All other components within  normal limits  PROTIME-INR - Abnormal; Notable for the following  components:   Prothrombin Time 25.2 (*)    INR 2.4 (*)    All other components within normal limits  COMPREHENSIVE METABOLIC PANEL - Abnormal; Notable for the following components:   Glucose, Bld 159 (*)    BUN 24 (*)    Creatinine, Ser 1.70 (*)    Calcium 8.8 (*)    Total Bilirubin 1.4 (*)    GFR, Estimated 42 (*)    All other components within normal limits  TROPONIN I (HIGH SENSITIVITY) - Abnormal; Notable for the following components:   Troponin I (High Sensitivity) 19 (*)    All other components within normal limits  RESP PANEL BY RT-PCR (FLU A&B, COVID) ARPGX2  MAGNESIUM  POC SARS CORONAVIRUS 2 AG -  ED  TROPONIN I (HIGH SENSITIVITY)    EKG EKG Interpretation  Date/Time:  Saturday December 07 2020 23:42:56 EST Ventricular Rate:  114 PR Interval:    QRS Duration: 103 QT Interval:  352 QTC Calculation: 485 R Axis:   94 Text Interpretation: Atrial flutter with predominant 2:1 AV block Right axis deviation Minimal ST depression, inferior leads Borderline prolonged QT interval Confirmed by Orpah Greek [phone]) on 12/07/2020 11:46:50 PM   Radiology CT Angio Chest PE W and/or Wo Contrast  Result Date: 12/08/2020 CLINICAL DATA:  Concern for pulmonary embolism. Dyspnea. Chest pressure. EXAM: CT ANGIOGRAPHY CHEST WITH CONTRAST TECHNIQUE: Multidetector CT imaging of the chest was performed using the standard protocol during bolus administration of intravenous contrast. Multiplanar CT image reconstructions and MIPs were obtained to evaluate the vascular anatomy. CONTRAST:  49mL OMNIPAQUE IOHEXOL 350 MG/ML SOLN COMPARISON:  Chest radiograph from earlier today. 09/16/2019 chest CT angiogram. 11/13/2020 coronary CT. FINDINGS: Cardiovascular: The study is high quality for the evaluation of pulmonary embolism. There are no filling defects in the central, lobar, segmental or subsegmental pulmonary artery branches to suggest acute pulmonary embolism. Atherosclerotic nonaneurysmal  thoracic aorta. Top-normal caliber main pulmonary artery (3.2 cm diameter). Top-normal heart size. No significant pericardial fluid/thickening. Aortic valve prosthesis in place. Left main and three-vessel coronary atherosclerosis status post CABG. Mediastinum/Nodes: No discrete thyroid nodules. Unremarkable esophagus. No pathologically enlarged axillary, mediastinal or hilar lymph nodes. Lungs/Pleura: No pneumothorax. No pleural effusion. Moderate centrilobular emphysema with diffuse bronchial wall thickening. Saber sheath trachea. No acute consolidative airspace disease or lung masses. Irregular 1.5 cm lingular solid pulmonary nodule (series 6/image 90), increased from 0.9 cm on 09/16/2019 CT. Peripheral right middle lobe 0.6 cm solid pulmonary nodule (series 6/image 109) is stable and considered benign. No additional significant pulmonary nodules. Upper abdomen: Contrast reflux into the IVC and hepatic veins. Colonic diverticulosis. Musculoskeletal: No aggressive appearing focal osseous lesions. Intact sternotomy wires. Mild thoracic spondylosis. Review of the MIP images confirms the above findings. IMPRESSION: 1. No pulmonary embolism. 2. Irregular 1.5 cm lingular solid pulmonary nodule, increased since 09/16/2019 CT, cannot exclude primary bronchogenic carcinoma. PET-CT suggested for further evaluation. 3. Moderate centrilobular emphysema with diffuse bronchial wall thickening and saber sheath trachea, suggesting COPD. 4. Contrast reflux into the IVC and hepatic veins, suggesting elevated central venous pressures/right heart failure. 5. Aortic Atherosclerosis (ICD10-I70.0) and Emphysema (ICD10-J43.9). Electronically Signed   By: Ilona Sorrel M.D.   On: 12/08/2020 04:38   DG Chest Portable 1 View  Result Date: 12/08/2020 CLINICAL DATA:  Dyspnea, chest pressure EXAM: PORTABLE CHEST 1 VIEW COMPARISON:  03/05/2020 FINDINGS: Advanced vascular calcifications noted within the lung apices bilaterally. Lungs are  otherwise  clear. No pneumothorax or pleural effusion. Coronary artery bypass grafting has been performed. Cardiac size within normal limits. Pulmonary vascularity is normal. No acute bone abnormality. IMPRESSION: No active disease. Electronically Signed   By: Fidela Salisbury MD   On: 12/08/2020 00:23    Procedures Procedures (including critical care time)  CRITICAL CARE Performed by: Rodney Booze   Total critical care time: 37 minutes  Critical care time was exclusive of separately billable procedures and treating other patients.  Critical care was necessary to treat or prevent imminent or life-threatening deterioration.  Critical care was time spent personally by me on the following activities: development of treatment plan with patient and/or surrogate as well as nursing, discussions with consultants, evaluation of patient's response to treatment, examination of patient, obtaining history from patient or surrogate, ordering and performing treatments and interventions, ordering and review of laboratory studies, ordering and review of radiographic studies, pulse oximetry and re-evaluation of patient's condition.    Medications Ordered in ED Medications  nitroGLYCERIN 50 mg in dextrose 5 % 250 mL (0.2 mg/mL) infusion (20 mcg/min Intravenous Rate/Dose Change 12/08/20 0444)  furosemide (LASIX) injection 40 mg (40 mg Intravenous Given 12/08/20 0006)  furosemide (LASIX) injection 20 mg (20 mg Intravenous Given 12/08/20 0131)  iohexol (OMNIPAQUE) 350 MG/ML injection 75 mL (75 mLs Intravenous Contrast Given 12/08/20 0427)    ED Course  I have reviewed the triage vital signs and the nursing notes.  Pertinent labs & imaging results that were available during my care of the patient were reviewed by me and considered in my medical decision making (see chart for details).    MDM Rules/Calculators/A&P                          75 year old male with significant cardiac history presenting to the  emergency department today for evaluation of chest pressure and shortness of breath that started suddenly around 4 PM.  Noted to be in A. fib with RVR in route.  Recently had ablation.  Is anticoagulated but was recently subtherapeutic on Coumadin 2 weeks ago.  Blood pressure significantly elevated on arrival but improved somewhat with nitroglycerin.  Initial EKG shows a flutter with predominant 2-1 AV block with right axis deviation, minimal ST depression in the inferior leads and borderline prolonged QT interval.  Reviewed/interpreted lab CBC shows a leukocytosis of 10.8, normal hemoglobin CMP with elevated bun/cr worse from prior, lefts wnl BNP marginally elevated Troponin negative, delta trop marginally elevated at 19 INR 2.4 Covid negative Mag wnl  CXR reviewed/interpreted - No active disease.  Pt started on ntg drip for bp. Also given iv lasix. Had about 500cc output and rr improved significantly.  Ct chest -  1. No pulmonary embolism. 2. Irregular 1.5 cm lingular solid pulmonary nodule, increased since 09/16/2019 CT, cannot exclude primary bronchogenic carcinoma. PET-CT suggested for further evaluation. 3. Moderate centrilobular emphysema with diffuse bronchial wall thickening and saber sheath trachea, suggesting COPD. 4. Contrast reflux into the IVC and hepatic veins, suggesting elevated central venous pressures/right heart failure. 5. Aortic Atherosclerosis (ICD10-I70.0) and Emphysema  - pt made aware of pulmonary nodule and need for f/u PET scan  Pt with respiratory failure.  Likely secondary to acute CHF exacerbation with flash pulmonary edema in setting of significant hypertension.  Placed on nitro drip here and given IV Lasix with good output and improvement of respiratory status.  Wean from 4 L to 2 L nasal cannula but still hypoxic  on room air.  We will plan for admission for further diuresis.  5:12 PM CONSULT With Dr. Tonie Griffith who accepts patient for admission   Final Clinical  Impression(s) / ED Diagnoses Final diagnoses:  Acute respiratory failure with hypoxia ([place])  Acute on chronic congestive heart failure, unspecified heart failure type [facility])  Pulmonary nodule    Rx / DC Orders ED Discharge Orders    None       Rodney Booze, PA-C 12/08/20 0524    Orpah Greek, MD 12/08/20 [phone]

## 2020-12-07 NOTE — ED Triage Notes (Signed)
Pt bib [facility].  At approximately 1600 today pt started having chest pressure.  Pain worsened and had increasing shortness of breath.  EMS reports pt was in afib w/rate of 110-135bpm, initial pressure was 260/palp, 90% on room air. Pt received 1 ntg PTA.  Pt A&Ox4 on arrival, diaphoretic, short of breath, 5/10 chest pressure.

## 2020-12-08 ENCOUNTER — Inpatient Hospital Stay (HOSPITAL_COMMUNITY): Payer: Medicare Other

## 2020-12-08 ENCOUNTER — Other Ambulatory Visit (HOSPITAL_COMMUNITY): Payer: Self-pay | Admitting: Cardiology

## 2020-12-08 ENCOUNTER — Emergency Department (HOSPITAL_COMMUNITY): Payer: Medicare Other

## 2020-12-08 ENCOUNTER — Encounter (HOSPITAL_COMMUNITY): Payer: Self-pay | Admitting: Family Medicine

## 2020-12-08 DIAGNOSIS — R911 Solitary pulmonary nodule: Secondary | ICD-10-CM

## 2020-12-08 DIAGNOSIS — Z7902 Long term (current) use of antithrombotics/antiplatelets: Secondary | ICD-10-CM | POA: Diagnosis not present

## 2020-12-08 DIAGNOSIS — I2699 Other pulmonary embolism without acute cor pulmonale: Secondary | ICD-10-CM | POA: Diagnosis not present

## 2020-12-08 DIAGNOSIS — Z7901 Long term (current) use of anticoagulants: Secondary | ICD-10-CM

## 2020-12-08 DIAGNOSIS — Z7984 Long term (current) use of oral hypoglycemic drugs: Secondary | ICD-10-CM | POA: Diagnosis not present

## 2020-12-08 DIAGNOSIS — I110 Hypertensive heart disease with heart failure: Secondary | ICD-10-CM | POA: Diagnosis not present

## 2020-12-08 DIAGNOSIS — Z87891 Personal history of nicotine dependence: Secondary | ICD-10-CM | POA: Diagnosis not present

## 2020-12-08 DIAGNOSIS — N179 Acute kidney failure, unspecified: Secondary | ICD-10-CM

## 2020-12-08 DIAGNOSIS — Z803 Family history of malignant neoplasm of breast: Secondary | ICD-10-CM | POA: Diagnosis not present

## 2020-12-08 DIAGNOSIS — I160 Hypertensive urgency: Secondary | ICD-10-CM | POA: Diagnosis present

## 2020-12-08 DIAGNOSIS — Z8673 Personal history of transient ischemic attack (TIA), and cerebral infarction without residual deficits: Secondary | ICD-10-CM | POA: Diagnosis not present

## 2020-12-08 DIAGNOSIS — Z951 Presence of aortocoronary bypass graft: Secondary | ICD-10-CM | POA: Diagnosis not present

## 2020-12-08 DIAGNOSIS — I5033 Acute on chronic diastolic (congestive) heart failure: Secondary | ICD-10-CM | POA: Diagnosis present

## 2020-12-08 DIAGNOSIS — R0600 Dyspnea, unspecified: Secondary | ICD-10-CM | POA: Diagnosis not present

## 2020-12-08 DIAGNOSIS — I2510 Atherosclerotic heart disease of native coronary artery without angina pectoris: Secondary | ICD-10-CM | POA: Diagnosis present

## 2020-12-08 DIAGNOSIS — I5031 Acute diastolic (congestive) heart failure: Secondary | ICD-10-CM | POA: Diagnosis not present

## 2020-12-08 DIAGNOSIS — J9601 Acute respiratory failure with hypoxia: Secondary | ICD-10-CM | POA: Diagnosis not present

## 2020-12-08 DIAGNOSIS — E1151 Type 2 diabetes mellitus with diabetic peripheral angiopathy without gangrene: Secondary | ICD-10-CM | POA: Diagnosis present

## 2020-12-08 DIAGNOSIS — Z8249 Family history of ischemic heart disease and other diseases of the circulatory system: Secondary | ICD-10-CM | POA: Diagnosis not present

## 2020-12-08 DIAGNOSIS — E119 Type 2 diabetes mellitus without complications: Secondary | ICD-10-CM

## 2020-12-08 DIAGNOSIS — J449 Chronic obstructive pulmonary disease, unspecified: Secondary | ICD-10-CM | POA: Diagnosis present

## 2020-12-08 DIAGNOSIS — Z955 Presence of coronary angioplasty implant and graft: Secondary | ICD-10-CM | POA: Diagnosis not present

## 2020-12-08 DIAGNOSIS — Z833 Family history of diabetes mellitus: Secondary | ICD-10-CM | POA: Diagnosis not present

## 2020-12-08 DIAGNOSIS — R0789 Other chest pain: Secondary | ICD-10-CM | POA: Diagnosis not present

## 2020-12-08 DIAGNOSIS — E785 Hyperlipidemia, unspecified: Secondary | ICD-10-CM | POA: Diagnosis present

## 2020-12-08 DIAGNOSIS — Z79899 Other long term (current) drug therapy: Secondary | ICD-10-CM | POA: Diagnosis not present

## 2020-12-08 DIAGNOSIS — I509 Heart failure, unspecified: Secondary | ICD-10-CM | POA: Diagnosis not present

## 2020-12-08 DIAGNOSIS — Z952 Presence of prosthetic heart valve: Secondary | ICD-10-CM | POA: Diagnosis not present

## 2020-12-08 DIAGNOSIS — Z20822 Contact with and (suspected) exposure to covid-19: Secondary | ICD-10-CM | POA: Diagnosis present

## 2020-12-08 HISTORY — DX: Acute kidney failure, unspecified: N17.9

## 2020-12-08 LAB — RESP PANEL BY RT-PCR (FLU A&B, COVID) ARPGX2
Influenza A by PCR: NEGATIVE
Influenza B by PCR: NEGATIVE
SARS Coronavirus 2 by RT PCR: NEGATIVE

## 2020-12-08 LAB — CBC WITH DIFFERENTIAL/PLATELET
Abs Immature Granulocytes: 0.05 10*3/uL (ref 0.00–0.07)
Basophils Absolute: 0.1 10*3/uL (ref 0.0–0.1)
Basophils Relative: 1 %
Eosinophils Absolute: 0.1 10*3/uL (ref 0.0–0.5)
Eosinophils Relative: 1 %
HCT: 48 % (ref 39.0–52.0)
Hemoglobin: 16.5 g/dL (ref 13.0–17.0)
Immature Granulocytes: 1 %
Lymphocytes Relative: 18 %
Lymphs Abs: 2 10*3/uL (ref 0.7–4.0)
MCH: 32.5 pg (ref 26.0–34.0)
MCHC: 34.4 g/dL (ref 30.0–36.0)
MCV: 94.7 fL (ref 80.0–100.0)
Monocytes Absolute: 1.4 10*3/uL — ABNORMAL HIGH (ref 0.1–1.0)
Monocytes Relative: 13 %
Neutro Abs: 7.3 10*3/uL (ref 1.7–7.7)
Neutrophils Relative %: 66 %
Platelets: 306 10*3/uL (ref 150–400)
RBC: 5.07 MIL/uL (ref 4.22–5.81)
RDW: 15.1 % (ref 11.5–15.5)
WBC: 10.8 10*3/uL — ABNORMAL HIGH (ref 4.0–10.5)
nRBC: 0 % (ref 0.0–0.2)

## 2020-12-08 LAB — COMPREHENSIVE METABOLIC PANEL
ALT: 28 U/L (ref 0–44)
AST: 27 U/L (ref 15–41)
Albumin: 3.6 g/dL (ref 3.5–5.0)
Alkaline Phosphatase: 69 U/L (ref 38–126)
Anion gap: 14 (ref 5–15)
BUN: 24 mg/dL — ABNORMAL HIGH (ref 8–23)
CO2: 23 mmol/L (ref 22–32)
Calcium: 8.8 mg/dL — ABNORMAL LOW (ref 8.9–10.3)
Chloride: 103 mmol/L (ref 98–111)
Creatinine, Ser: 1.7 mg/dL — ABNORMAL HIGH (ref 0.61–1.24)
GFR, Estimated: 42 mL/min — ABNORMAL LOW (ref >60)
Glucose, Bld: 159 mg/dL — ABNORMAL HIGH (ref 70–99)
Potassium: 3.9 mmol/L (ref 3.5–5.1)
Sodium: 140 mmol/L (ref 135–145)
Total Bilirubin: 1.4 mg/dL — ABNORMAL HIGH (ref 0.3–1.2)
Total Protein: 6.9 g/dL (ref 6.5–8.1)

## 2020-12-08 LAB — ECHOCARDIOGRAM COMPLETE
Area-P 1/2: 2.17 cm2
Height: 68 in
S' Lateral: 2.8 cm
Weight: 2784 oz

## 2020-12-08 LAB — PROTIME-INR
INR: 2.2 — ABNORMAL HIGH (ref 0.8–1.2)
INR: 2.4 — ABNORMAL HIGH (ref 0.8–1.2)
Prothrombin Time: 23.4 seconds — ABNORMAL HIGH (ref 11.4–15.2)
Prothrombin Time: 25.2 seconds — ABNORMAL HIGH (ref 11.4–15.2)

## 2020-12-08 LAB — GLUCOSE, CAPILLARY
Glucose-Capillary: 194 mg/dL — ABNORMAL HIGH (ref 70–99)
Glucose-Capillary: 164 mg/dL — ABNORMAL HIGH (ref 70–99)

## 2020-12-08 LAB — POC SARS CORONAVIRUS 2 AG -  ED: SARS Coronavirus 2 Ag: NEGATIVE

## 2020-12-08 LAB — BRAIN NATRIURETIC PEPTIDE: B Natriuretic Peptide: 153.7 pg/mL — ABNORMAL HIGH (ref 0.0–100.0)

## 2020-12-08 LAB — TROPONIN I (HIGH SENSITIVITY)
Troponin I (High Sensitivity): 15 ng/L (ref <18)
Troponin I (High Sensitivity): 19 ng/L — ABNORMAL HIGH (ref <18)

## 2020-12-08 LAB — CBG MONITORING, ED
Glucose-Capillary: 121 mg/dL — ABNORMAL HIGH (ref 70–99)
Glucose-Capillary: 117 mg/dL — ABNORMAL HIGH (ref 70–99)

## 2020-12-08 LAB — MAGNESIUM: Magnesium: 1.9 mg/dL (ref 1.7–2.4)

## 2020-12-08 LAB — HEMOGLOBIN A1C
Hgb A1c MFr Bld: 7.1 % — ABNORMAL HIGH (ref 4.8–5.6)
Mean Plasma Glucose: 157.07 mg/dL

## 2020-12-08 MED ORDER — CANAGLIFLOZIN 100 MG PO TABS
100.0000 mg | ORAL_TABLET | Freq: Every day | ORAL | Status: DC
  Administered 2020-12-08 – 2020-12-09 (×2): 100 mg via ORAL
  Filled 2020-12-08 (×2): qty 1

## 2020-12-08 MED ORDER — SODIUM CHLORIDE 0.9 % IV SOLN: 250.0000 mL | INTRAVENOUS | Status: DC | PRN

## 2020-12-08 MED ORDER — WARFARIN SODIUM 5 MG PO TABS
5.0000 mg | ORAL_TABLET | ORAL | Status: DC
  Filled 2020-12-08: qty 1

## 2020-12-08 MED ORDER — ATORVASTATIN CALCIUM 80 MG PO TABS
80.0000 mg | ORAL_TABLET | Freq: Every day | ORAL | Status: DC
  Administered 2020-12-08 – 2020-12-09 (×2): 80 mg via ORAL
  Filled 2020-12-08 (×2): qty 1

## 2020-12-08 MED ORDER — SODIUM CHLORIDE 0.9% FLUSH
3.0000 mL | Freq: Two times a day (BID) | INTRAVENOUS | Status: DC
  Administered 2020-12-08: 3 mL via INTRAVENOUS

## 2020-12-08 MED ORDER — POTASSIUM CHLORIDE CRYS ER 10 MEQ PO TBCR
10.0000 meq | EXTENDED_RELEASE_TABLET | Freq: Every day | ORAL | Status: DC
  Administered 2020-12-08 – 2020-12-09 (×2): 10 meq via ORAL
  Filled 2020-12-08 (×2): qty 1

## 2020-12-08 MED ORDER — FUROSEMIDE 10 MG/ML IJ SOLN
20.0000 mg | Freq: Once | INTRAMUSCULAR | Status: AC
  Administered 2020-12-08: 20 mg via INTRAVENOUS
  Filled 2020-12-08: qty 2

## 2020-12-08 MED ORDER — FUROSEMIDE 10 MG/ML IJ SOLN
40.0000 mg | Freq: Two times a day (BID) | INTRAMUSCULAR | Status: AC
  Administered 2020-12-08 – 2020-12-09 (×3): 40 mg via INTRAVENOUS
  Filled 2020-12-08 (×3): qty 4

## 2020-12-08 MED ORDER — SACUBITRIL-VALSARTAN 24-26 MG PO TABS
1.0000 | ORAL_TABLET | Freq: Two times a day (BID) | ORAL | Status: DC
  Administered 2020-12-08 – 2020-12-09 (×3): 1 via ORAL
  Filled 2020-12-08 (×4): qty 1

## 2020-12-08 MED ORDER — WARFARIN - PHYSICIAN DOSING INPATIENT: Freq: Every day | Status: DC

## 2020-12-08 MED ORDER — SODIUM CHLORIDE 0.9% FLUSH: 3.0000 mL | INTRAVENOUS | Status: DC | PRN

## 2020-12-08 MED ORDER — INSULIN ASPART 100 UNIT/ML ~~LOC~~ SOLN: 0.0000 [IU] | Freq: Every day | SUBCUTANEOUS | Status: DC

## 2020-12-08 MED ORDER — CLOPIDOGREL BISULFATE 75 MG PO TABS
75.0000 mg | ORAL_TABLET | Freq: Every day | ORAL | Status: DC
  Administered 2020-12-08 – 2020-12-09 (×2): 75 mg via ORAL
  Filled 2020-12-08 (×2): qty 1

## 2020-12-08 MED ORDER — WARFARIN SODIUM 7.5 MG PO TABS
7.5000 mg | ORAL_TABLET | ORAL | Status: DC
  Administered 2020-12-08: 7.5 mg via ORAL
  Filled 2020-12-08 (×2): qty 1

## 2020-12-08 MED ORDER — ACETAMINOPHEN 325 MG PO TABS: 650.0000 mg | ORAL_TABLET | ORAL | Status: DC | PRN

## 2020-12-08 MED ORDER — INSULIN ASPART 100 UNIT/ML ~~LOC~~ SOLN
0.0000 [IU] | Freq: Three times a day (TID) | SUBCUTANEOUS | Status: DC
  Administered 2020-12-08: 2 [IU] via SUBCUTANEOUS
  Administered 2020-12-09: 3 [IU] via SUBCUTANEOUS

## 2020-12-08 MED ORDER — IOHEXOL 350 MG/ML SOLN
75.0000 mL | Freq: Once | INTRAVENOUS | Status: AC | PRN
  Administered 2020-12-08: 75 mL via INTRAVENOUS

## 2020-12-08 MED ORDER — WARFARIN SODIUM 5 MG PO TABS: 5.0000 mg | ORAL_TABLET | ORAL | Status: DC

## 2020-12-08 MED ORDER — AMIODARONE HCL 100 MG PO TABS
100.0000 mg | ORAL_TABLET | Freq: Every day | ORAL | Status: DC
  Administered 2020-12-08 – 2020-12-09 (×2): 100 mg via ORAL
  Filled 2020-12-08 (×2): qty 1

## 2020-12-08 NOTE — ED Notes (Signed)
Ordered a hospital bed

## 2020-12-08 NOTE — H&P (Signed)
History and Physical    [name] [name] CWC:[id number] DOB: 1946/06/09 DOA: 12/07/2020  PCP: Shon Baton, MD   Patient coming from: Home  Chief Complaint:   Shortness of breath and chest pain  HPI: [name] [name] is a 75 y.o. male with medical history significant for aortic stenosis s/p mechanical AVR on chronic Coumadin, CAD s/p CABG x3, diverticulitis, hyperlipidemia, hypertension, PAD, renal artery stenosis, stroke, diabetes, who presents by EMS for evaluation of shortness of breath and chest pressure.  States that symptoms started around 4 PM on 12/07/20.  Chest pressure located in the center of the chest and he became short of breath. He was also diaphoretic. Chest pain did not radiate.  States he felt like he had some congestion in his lungs.  He was noted to be in A. fib with a heart rate of 110-135 by EMS and was mildly hypoxic at 90% on room air. He was placed on 4 L O2 and his sats increased to 98%.  Blood pressure was found to be significantly elevated in the 761 systolic range. He was placed on NTG infusion in the Er.  After nitroglycerin symptoms somewhat improved and blood pressure improved to the 607 systolic. He is on Coumadin/Plavix chronically.  He had an ablation 2 weeks ago with the plan that his amiodarone could then be discontinued.  He has follow-up with his cardiologist in 2 weeks.  ED Course: In the emergency room patient was placed on nitroglycerin infusion with improvement in his blood pressure.  Oxygen has been titrated down to 2 L/min by nasal cannula.  Patient is breathing more comfortably and has no chest pain at this time.  BNP is mildly elevated.  CTA of the chest was performed and no pulmonary embolism was found.  There is an irregular 1.5 cm lung nodule radiology states is larger than previous scan and will need further work-up with PET-CT scan  Review of Systems:  General: Denies weakness, fever, chills, weight loss, night sweats.  Denies dizziness.   Denies change in appetite HENT: Denies head trauma, denies change in hearing, tinnitus.  Denies nasal congestion or bleeding.  Denies sore throat, sores in mouth.  Denies difficulty swallowing Eyes: Denies blurry vision, pain in eye, drainage.  Denies discoloration of eyes. Neck: Denies pain.  Denies swelling.  Denies pain with movement. Cardiovascular: Reports mild substernal chest pain. denies palpitations.  Reports mild leg edema.  Denies orthopnea Respiratory: Reports shortness of breath.  Denies cough.  Denies wheezing.  Denies sputum production Gastrointestinal: Denies abdominal pain, swelling.  Denies nausea, vomiting, diarrhea.  Denies melena.  Denies hematemesis. Musculoskeletal: Denies limitation of movement.  Denies deformity or swelling. Denies arthralgias or myalgias. Genitourinary: Denies pelvic pain.  Denies urinary frequency or hesitancy.  Denies dysuria.  Skin: Denies rash.  Denies petechiae, purpura, ecchymosis. Neurological: Denies headache.  Denies syncope.  Denies seizure activity.  Denies weakness or paresthesia.  Denies slurred speech, drooping face.  Denies visual change. Psychiatric: Denies depression, anxiety. Denies hallucinations.  Past Medical History:  Diagnosis Date  . Adenomatous colon polyp 09/1997  . Anemia   . Aortic stenosis    s/p [facility] mechanical AVR - Chronic Coumadin  . Blood transfusion    "related to ITP"  . CHF (congestive heart failure) ([place])   . COPD (chronic obstructive pulmonary disease) ([place])   . Coronary artery disease    s/p cabg x 3 11/2003: lima-lad, seq vg to rpda and rpl  . Diverticulitis of colon   .  Dysrhythmia   . Heart murmur   . Hyperlipidemia   . Hypertension   . ITP (idiopathic thrombocytopenic purpura)   . Peripheral arterial disease ([place])    a. history of aortobifemoral bypass grafting by Dr. Sherren Mocha early b. LE angiography 04/22/2015 patent aortobifem graft, DES to R SFA  . Peripheral vascular disease ([place])    s/p Left  external Iliac Artery stenting and subsequent left femoral endarterectomy 02/2011- post op course complicated by wound infxn req I&D 03/2011  . Renal artery stenosis, native, bilateral (HCC)    a. bilateral renal artery stenosis by recent duplex ultrasound b. L renal artery stent 02/2015, R renal artery patent on angiogram  . Stroke ([place])   . TIA (transient ischemic attack) ~ 2013  . Type II diabetes mellitus ([place])     Past Surgical History:  Procedure Laterality Date  . ABDOMINAL AORTAGRAM N/A 12/16/2011   Procedure: ABDOMINAL Maxcine Ham;  Surgeon: Sherren Mocha, MD;  Location: [facility] CATH LAB;  Service: Cardiovascular;  Laterality: N/A;  . ABDOMINAL AORTOGRAM W/LOWER EXTREMITY Right 02/29/2020   Procedure: ABDOMINAL AORTOGRAM W/LOWER EXTREMITY;  Surgeon: Lorretta Harp, MD;  Location: [place] CV LAB;  Service: Cardiovascular;  Laterality: Right;  . ANGIOPLASTY / STENTING ILIAC     Left external Iliac Artery  . AORTA - BILATERAL FEMORAL ARTERY BYPASS GRAFT  01/18/2012   Procedure: AORTA BIFEMORAL BYPASS GRAFT;  Surgeon: Curt Jews, MD;  Location: [place];  Service: Vascular;  Laterality: N/A;  . AORTIC VALVE REPLACEMENT  ~ 2004  . ATRIAL FIBRILLATION ABLATION N/A 11/20/2020   Procedure: ATRIAL FIBRILLATION ABLATION;  Surgeon: Constance Haw, MD;  Location: [place] [facility] LAB;  Service: Cardiovascular;  Laterality: N/A;  . CARDIAC CATHETERIZATION  11/2003   /pt report 10/01/2016  . CARDIAC VALVE REPLACEMENT  11/2003   aortic  . CARDIOVERSION N/A 10/16/2019   Procedure: CARDIOVERSION;  Surgeon: Larey Dresser, MD;  Location: [facility] ENDOSCOPY;  Service: Cardiovascular;  Laterality: N/A;  . CATARACT EXTRACTION W/ INTRAOCULAR LENS  IMPLANT, BILATERAL Bilateral   . COLONOSCOPY    . COLONOSCOPY WITH PROPOFOL N/A 09/03/2019   Procedure: COLONOSCOPY WITH PROPOFOL;  Surgeon: Doran Stabler, MD;  Location: [facility] ENDOSCOPY;  Service: Gastroenterology;  Laterality: N/A;  . CORONARY ARTERY BYPASS  GRAFT  11/2003   Archie Endo 04/21/2011  . CORONARY ATHERECTOMY N/A 10/10/2019   Procedure: CORONARY ATHERECTOMY;  Surgeon: Sherren Mocha, MD;  Location: [place] [facility] LAB;  Service: Cardiovascular;  Laterality: N/A;  . CORONARY STENT INTERVENTION N/A 10/10/2019   Procedure: CORONARY STENT INTERVENTION;  Surgeon: Sherren Mocha, MD;  Location: [place] CV LAB;  Service: Cardiovascular;  Laterality: N/A;  . CORONARY/GRAFT ANGIOGRAPHY N/A 09/18/2019   Procedure: CORONARY/GRAFT ANGIOGRAPHY;  Surgeon: Lorretta Harp, MD;  Location: [place] CV LAB;  Service: Cardiovascular;  Laterality: N/A;  . ESOPHAGOGASTRODUODENOSCOPY (EGD) WITH PROPOFOL N/A 02/27/2019   Procedure: ESOPHAGOGASTRODUODENOSCOPY (EGD) WITH PROPOFOL;  Surgeon: Doran Stabler, MD;  Location: [place];  Service: Endoscopy;  Laterality: N/A;  . HEMOSTASIS CLIP PLACEMENT  09/03/2019   Procedure: HEMOSTASIS CLIP PLACEMENT;  Surgeon: Doran Stabler, MD;  Location: [facility] ENDOSCOPY;  Service: Gastroenterology;;  . LOWER EXTREMITY ANGIOGRAM N/A 02/21/2015   Procedure: LOWER EXTREMITY ANGIOGRAM;  Surgeon: Lorretta Harp, MD;  Location: [facility] CATH LAB;  Service: Cardiovascular;  Laterality: N/A;  . PERIPHERAL VASCULAR CATHETERIZATION N/A 04/22/2015   Procedure: Lower Extremity Angiography;  Surgeon: Lorretta Harp, MD;  Location: [place] CV LAB;  Service: Cardiovascular;  Laterality: N/A;  . PERIPHERAL VASCULAR CATHETERIZATION N/A 08/24/2016   Procedure: Lower Extremity Angiography;  Surgeon: Lorretta Harp, MD;  Location: [place] CV LAB;  Service: Cardiovascular;  Laterality: N/A;  . PERIPHERAL VASCULAR CATHETERIZATION Right 10/01/2016   Procedure: Peripheral Vascular Intervention - STENT;  Surgeon: Lorretta Harp, MD;  Location: [place] CV LAB;  Service: Cardiovascular;  Laterality: Right;  Prox and MID SFA   . PERIPHERAL VASCULAR INTERVENTION  02/29/2020   Procedure: PERIPHERAL VASCULAR INTERVENTION;  Surgeon: Lorretta Harp, MD;  Location: [place] CV LAB;  Service: Cardiovascular;;  Right SFA  . POLYPECTOMY    . RENAL ANGIOGRAM N/A 02/21/2015   Procedure: RENAL ANGIOGRAM;  Surgeon: Lorretta Harp, MD;  Location: [facility] CATH LAB;  Service: Cardiovascular;  Laterality: Bilateral; 6 mm x 12 mm long Herculink balloon expandable stent to the left renal artery  . RENAL ARTERY STENT Left 04/22/2015   dr berry  . SPLENECTOMY  02/2003   Archie Endo 04/21/2011  . TEE WITHOUT CARDIOVERSION N/A 10/16/2019   Procedure: TRANSESOPHAGEAL ECHOCARDIOGRAM (TEE);  Surgeon: Larey Dresser, MD;  Location: [facility] ENDOSCOPY;  Service: Cardiovascular;  Laterality: N/A;  . TEMPORARY PACEMAKER N/A 10/10/2019   Procedure: TEMPORARY PACEMAKER;  Surgeon: Sherren Mocha, MD;  Location: [place] CV LAB;  Service: Cardiovascular;  Laterality: N/A;  . TONSILLECTOMY  ~ 1952    Social History  reports that he quit smoking about 27 years ago. His smoking use included cigarettes and cigars. He quit after 30.00 years of use. He has never used smokeless tobacco. He reports current alcohol use of about 16.0 standard drinks of alcohol per week. He reports that he does not use drugs.  No Known Allergies  Family History  Problem Relation Age of Onset  . Coronary artery disease Mother        bypass surgery - deceased  . Heart disease Father        murmur, valve replacement - deceased  . Breast cancer Sister   . Diabetes Other        grandmother  . Diabetes Paternal Grandmother   . Diabetes Paternal Aunt   . Colon cancer Neg Hx   . Colon polyps Neg Hx   . Esophageal cancer Neg Hx   . Rectal cancer Neg Hx   . Stomach cancer Neg Hx      Prior to Admission medications   Medication Sig Start Date End Date Taking? Authorizing Provider  amiodarone (PACERONE) 100 MG tablet Take 1 tablet (100 mg total) by mouth daily. 02/23/20   Larey Dresser, MD  amoxicillin (AMOXIL) 500 MG capsule Take 2,000 mg by mouth See admin instructions. Take 2000  mg by mouth 30-60 minutes before dental appointment 06/08/19   [provider]  atorvastatin (LIPITOR) 80 MG tablet Take 80 mg by mouth daily.    [provider]  clopidogrel (PLAVIX) 75 MG tablet TAKE 1 TABLET BY MOUTH EVERY DAY 04/01/20   Larey Dresser, MD  ENTRESTO 24-26 MG TAKE 1 TABLET BY MOUTH TWICE A DAY Patient taking differently: Take 1 tablet by mouth 2 (two) times daily. 05/22/20   Larey Dresser, MD  Evolocumab 140 MG/ML SOAJ Inject 140 mg into the skin every 14 (fourteen) days.    [provider]  furosemide (LASIX) 20 MG tablet Take 1 tablet (20 mg total) by mouth daily. 07/03/20   Bensimhon, Shaune Pascal, MD  glyBURIDE (DIABETA) 2.5 MG tablet Take 2.5 mg  by mouth 2 (two) times daily with a meal.     [provider]  INVOKANA 100 MG TABS tablet Take 100 mg by mouth daily. 02/26/20   [provider]  nitroGLYCERIN (NITROSTAT) 0.4 MG SL tablet Place 1 tablet (0.4 mg total) under the tongue every 5 (five) minutes x 3 doses as needed for chest pain. 09/19/19   Daune Perch, NP  warfarin (COUMADIN) 5 MG tablet TAKE AS DIRECTED BY COUMADIN CLINIC Patient taking differently: Take 5-7.5 mg by mouth See admin instructions. Take 7.5 mg on Sun and Thurs, Take 5 mg on Mon, Tues, Wed, Fri, and Sat 10/02/20   Josue Hector, MD    Physical Exam: Vitals:   12/08/20 0400 12/08/20 0442 12/08/20 0500 12/08/20 0530  BP: (!) 141/51 (!) 157/42 (!) 147/47 (!) 155/56  Pulse: (!) 42 (!) 42 (!) 42 64  Resp: 19 (!) 23 17 17   Temp:      TempSrc:      SpO2: 93% 96% 93% 92%  Weight:      Height:        Constitutional: NAD, calm, comfortable Vitals:   12/08/20 0400 12/08/20 0442 12/08/20 0500 12/08/20 0530  BP: (!) 141/51 (!) 157/42 (!) 147/47 (!) 155/56  Pulse: (!) 42 (!) 42 (!) 42 64  Resp: 19 (!) 23 17 17   Temp:      TempSrc:      SpO2: 93% 96% 93% 92%  Weight:      Height:       General: WDWN, Alert and oriented x3.  Eyes: EOMI, PERRL,  conjunctivae normal.  Sclera nonicteric HENT:  [place]/AT, external ears normal.  Nares patent without epistasis.  Mucous membranes are moist.  Neck: Soft, normal range of motion, supple, no masses, no thyromegaly. Trachea midline Respiratory: Equal but mildly diminished breath sounds.  Diffuse scattered rales, no wheezing, no crackles. Normal respiratory effort. No accessory muscle use.  Cardiovascular: Irregularly irregular with normal rate.  Mechanical valve click present. no murmurs / rubs / gallops. Mild lower extremity edema. Abdomen: Soft, no tenderness, nondistended, no rebound or guarding.  No masses palpated. Bowel sounds normoactive Musculoskeletal: FROM. no clubbing / cyanosis. No joint deformity upper and lower extremities. Normal muscle tone.  Skin: Warm, dry, intact no rashes, lesions, ulcers. No induration Neurologic: CN 2-12 grossly intact.  Normal speech.  Sensation intact, Strength 5/5 in all extremities.   Psychiatric: Normal judgment and insight.  Normal mood.    Labs on Admission: I have personally reviewed following labs and imaging studies  CBC: Recent Labs  Lab 12/07/20 2356  WBC 10.8*  NEUTROABS 7.3  HGB 16.5  HCT 48.0  MCV 94.7  PLT 174    Basic Metabolic Panel: Recent Labs  Lab 12/08/20 0141  NA 140  K 3.9  CL 103  CO2 23  GLUCOSE 159*  BUN 24*  CREATININE 1.70*  CALCIUM 8.8*  MG 1.9    GFR: Estimated Creatinine Clearance: 36.3 mL/min (A) (by C-G formula based on SCr of 1.7 mg/dL (H)).  Liver Function Tests: Recent Labs  Lab 12/08/20 0141  AST 27  ALT 28  ALKPHOS 69  BILITOT 1.4*  PROT 6.9  ALBUMIN 3.6    Urine analysis:    Component Value Date/Time   COLORURINE YELLOW 09/16/2019 1230   APPEARANCEUR CLEAR 09/16/2019 1230   LABSPEC 1.027 09/16/2019 1230   PHURINE 7.0 09/16/2019 1230   GLUCOSEU >=500 (A) 09/16/2019 1230   [place] 09/16/2019 1230  BILIRUBINUR NEGATIVE 09/16/2019 1230   KETONESUR 5 (A) 09/16/2019 1230    PROTEINUR NEGATIVE 09/16/2019 1230   UROBILINOGEN 0.2 01/15/2012 1415   NITRITE NEGATIVE 09/16/2019 [place] 09/16/2019 1230    Radiological Exams on Admission: CT Angio Chest PE W and/or Wo Contrast  Result Date: 12/08/2020 CLINICAL DATA:  Concern for pulmonary embolism. Dyspnea. Chest pressure. EXAM: CT ANGIOGRAPHY CHEST WITH CONTRAST TECHNIQUE: Multidetector CT imaging of the chest was performed using the standard protocol during bolus administration of intravenous contrast. Multiplanar CT image reconstructions and MIPs were obtained to evaluate the vascular anatomy. CONTRAST:  69mL OMNIPAQUE IOHEXOL 350 MG/ML SOLN COMPARISON:  Chest radiograph from earlier today. 09/16/2019 chest CT angiogram. 11/13/2020 coronary CT. FINDINGS: Cardiovascular: The study is high quality for the evaluation of pulmonary embolism. There are no filling defects in the central, lobar, segmental or subsegmental pulmonary artery branches to suggest acute pulmonary embolism. Atherosclerotic nonaneurysmal thoracic aorta. Top-normal caliber main pulmonary artery (3.2 cm diameter). Top-normal heart size. No significant pericardial fluid/thickening. Aortic valve prosthesis in place. Left main and three-vessel coronary atherosclerosis status post CABG. Mediastinum/Nodes: No discrete thyroid nodules. Unremarkable esophagus. No pathologically enlarged axillary, mediastinal or hilar lymph nodes. Lungs/Pleura: No pneumothorax. No pleural effusion. Moderate centrilobular emphysema with diffuse bronchial wall thickening. Saber sheath trachea. No acute consolidative airspace disease or lung masses. Irregular 1.5 cm lingular solid pulmonary nodule (series 6/image 90), increased from 0.9 cm on 09/16/2019 CT. Peripheral right middle lobe 0.6 cm solid pulmonary nodule (series 6/image 109) is stable and considered benign. No additional significant pulmonary nodules. Upper abdomen: Contrast reflux into the IVC and hepatic veins.  Colonic diverticulosis. Musculoskeletal: No aggressive appearing focal osseous lesions. Intact sternotomy wires. Mild thoracic spondylosis. Review of the MIP images confirms the above findings. IMPRESSION: 1. No pulmonary embolism. 2. Irregular 1.5 cm lingular solid pulmonary nodule, increased since 09/16/2019 CT, cannot exclude primary bronchogenic carcinoma. PET-CT suggested for further evaluation. 3. Moderate centrilobular emphysema with diffuse bronchial wall thickening and saber sheath trachea, suggesting COPD. 4. Contrast reflux into the IVC and hepatic veins, suggesting elevated central venous pressures/right heart failure. 5. Aortic Atherosclerosis (ICD10-I70.0) and Emphysema (ICD10-J43.9). Electronically Signed   By: Ilona Sorrel M.D.   On: 12/08/2020 04:38   DG Chest Portable 1 View  Result Date: 12/08/2020 CLINICAL DATA:  Dyspnea, chest pressure EXAM: PORTABLE CHEST 1 VIEW COMPARISON:  03/05/2020 FINDINGS: Advanced vascular calcifications noted within the lung apices bilaterally. Lungs are otherwise clear. No pneumothorax or pleural effusion. Coronary artery bypass grafting has been performed. Cardiac size within normal limits. Pulmonary vascularity is normal. No acute bone abnormality. IMPRESSION: No active disease. Electronically Signed   By: Fidela Salisbury MD   On: 12/08/2020 00:23    EKG: Independently reviewed.  EKG shows atrial flutter with a 2-1 rate.  Inferior ST changes but no acute ST elevation or depression.  QTc is borderline prolonged at 485  Assessment/Plan Principal Problem:   Acute on chronic heart failure with preserved ejection fraction (HFpEF)  Mr. Skeet is admitted to progressive care unit.  He was diuresed with Lasix in the emergency room and has improved breathing.  He is still on oxygen by nasal cannula 2 L/min.  Continue to diurese with Lasix twice a day and monitor I&O's and daily weight. Continue home dose of Entresto and amiodarone Check serial troponin  levels. Check echocardiogram to evaluate EF and wall motion.  Active Problems:   Hypertensive urgency Patient placed on nitroglycerin infusion in the emergency  room with hypertensive urgency with systolic blood pressure greater than 265.  Blood pressure is now improved and nitroglycerin infusion will be weaned as tolerated.    Coronary artery disease Continue home regimen of aspirin and Plavix.  Continue Lipitor    Diabetes mellitus type 2, controlled  Continue home dose of Invokana.  Glyburide will be held to prevent hypoglycemia while in the hospital. Monitor blood sugars with meals and at bedtime.  Sliding scale insulin as needed for glycemic control. Check hemoglobin A1c level     AKI Monitor renal function and electrolytes with labs. Most likely due to decreased perfusion of kidneys with acute CHF exacerbation and will improve with improved cardiac function.      Lung nodule Patient has an irregular shaped lung nodule that has grown in size since October 2020 according to radiology report.  Malignancy cannot be excluded and radiology recommends PET scan for further evaluation    H/O mechanical aortic valve replacement   Chronic anticoagulation Is on Coumadin chronically without bleeding complications secondary to mechanical heart valve.  Dr. INR and continue Coumadin    DVT prophylaxis: Patient is anticoagulated with Coumadin.  We will continue with Coumadin and monitor INR daily Code Status:   Full code Family Communication:  Diagnosis and plan discussed with patient.  Patient verbalized understanding agrees with plan.  Further recommendations to follow as clinically indicated Disposition Plan:   Patient is from:  Home  Anticipated DC to:  Home  Anticipated DC date:  Anticipate 2 midnights or more in the hospital to treat acute condition  Anticipated DC barriers: Discharge identified at this time  Admission status:  Inpatient   Yevonne Aline Henny Strauch MD [facility]  How to contact the [facility] Attending or Consulting provider [place] or covering provider during after hours [place], for this patient?   1. Check the care team in [facility] and look for a) attending/consulting [facility] provider listed and b) the [facility] team listed 2. Log into [email or web address] and use [place]'s universal password to access. If you do not have the password, please contact the hospital operator. 3. Locate the [facility] provider you are looking for under [facility] and page to a number that you can be directly reached. 4. If you still have difficulty reaching the provider, please page the [facility] (Director on Call) for the Hospitalists listed on amion for assistance.  12/08/2020, 6:03 AM

## 2020-12-08 NOTE — Progress Notes (Signed)
No charge progress note.   [name] [name] is a 75 y.o. male with medical history significant for aortic stenosis s/p mechanical AVR on chronic Coumadin, CAD s/p CABG x3, diverticulitis, hyperlipidemia, hypertension, PAD, renal artery stenosis, stroke, diabetes, who presents by EMS for evaluation of shortness of breath and chest pressure. States that symptoms started around 4 PM on 12/07/20. Chest pressure located in the center of the chest and he became short of breath. He was also diaphoretic. Chest pain did not radiate. States he felt like he had some congestion in his lungs.He was noted to be in A. fib with a heart rate of 110-135 by EMS and was mildly hypoxic at 90% on room air. He was placed on 4 L O2 and his sats increased to 98%. Blood pressure was found to be significantly elevated in the 370 systolic range. He was placed on NTG infusion in the ED, resulted in improvement in his blood pressure. BNP mildly elevated.  CTA was negative for PE but did show an irregular 1.5 cm lung nodule which appears larger than previous scan and need further outpatient work-up with PET scan.  It was started on IV Lasix with improvement in his symptoms.  Converted back to sinus rhythm.  Saturating well on room air. Repeat echocardiogram without any significant abnormality.  On exam he had regular rate and rhythm, chest was clear and no peripheral edema.  We will continue with IV diuresis today.  Can be discharged home on increased dose of Lasix from 20 to 40 mg daily tomorrow if remains stable.

## 2020-12-08 NOTE — ED Notes (Signed)
Attempted report x1. 

## 2020-12-08 NOTE — ED Notes (Signed)
Lab to add A1c

## 2020-12-08 NOTE — Progress Notes (Signed)
  Echocardiogram 2D Echocardiogram has been performed.  [name] [name] 12/08/2020, 10:37 AM

## 2020-12-08 NOTE — ED Notes (Signed)
Pt 02 sat= 86%-88% on room air. 2LNC applied.

## 2020-12-09 ENCOUNTER — Encounter (HOSPITAL_COMMUNITY): Payer: Self-pay | Admitting: Family Medicine

## 2020-12-09 DIAGNOSIS — I5033 Acute on chronic diastolic (congestive) heart failure: Secondary | ICD-10-CM | POA: Diagnosis not present

## 2020-12-09 LAB — BASIC METABOLIC PANEL
Anion gap: 15 (ref 5–15)
BUN: 26 mg/dL — ABNORMAL HIGH (ref 8–23)
CO2: 24 mmol/L (ref 22–32)
Calcium: 8.5 mg/dL — ABNORMAL LOW (ref 8.9–10.3)
Chloride: 99 mmol/L (ref 98–111)
Creatinine, Ser: 1.3 mg/dL — ABNORMAL HIGH (ref 0.61–1.24)
GFR, Estimated: 57 mL/min — ABNORMAL LOW (ref >60)
Glucose, Bld: 124 mg/dL — ABNORMAL HIGH (ref 70–99)
Potassium: 4 mmol/L (ref 3.5–5.1)
Sodium: 138 mmol/L (ref 135–145)

## 2020-12-09 LAB — GLUCOSE, CAPILLARY
Glucose-Capillary: 142 mg/dL — ABNORMAL HIGH (ref 70–99)
Glucose-Capillary: 227 mg/dL — ABNORMAL HIGH (ref 70–99)

## 2020-12-09 LAB — PROTIME-INR
INR: 2.3 — ABNORMAL HIGH (ref 0.8–1.2)
Prothrombin Time: 24.8 seconds — ABNORMAL HIGH (ref 11.4–15.2)

## 2020-12-09 MED ORDER — FUROSEMIDE 20 MG PO TABS: 40.0000 mg | ORAL_TABLET | Freq: Every day | ORAL | 3 refills | Status: DC

## 2020-12-09 NOTE — Plan of Care (Signed)
  Problem: Education: Goal: Knowledge of General Education information will improve Description: Including pain rating scale, medication(s)/side effects and non-pharmacologic comfort measures 12/09/2020 1421 by Mickie Kay, RN Outcome: Adequate for Discharge 12/09/2020 1421 by Mickie Kay, RN Outcome: Adequate for Discharge   Problem: Health Behavior/Discharge Planning: Goal: Ability to manage health-related needs will improve 12/09/2020 1421 by Mickie Kay, RN Outcome: Adequate for Discharge 12/09/2020 1421 by Mickie Kay, RN Outcome: Adequate for Discharge   Problem: Clinical Measurements: Goal: Ability to maintain clinical measurements within normal limits will improve 12/09/2020 1421 by Mickie Kay, RN Outcome: Adequate for Discharge 12/09/2020 1421 by Mickie Kay, RN Outcome: Adequate for Discharge Goal: Will remain free from infection 12/09/2020 1421 by Mickie Kay, RN Outcome: Adequate for Discharge 12/09/2020 1421 by Mickie Kay, RN Outcome: Adequate for Discharge Goal: Diagnostic test results will improve 12/09/2020 1421 by Mickie Kay, RN Outcome: Adequate for Discharge 12/09/2020 1421 by Mickie Kay, RN Outcome: Adequate for Discharge Goal: Respiratory complications will improve 12/09/2020 1421 by Mickie Kay, RN Outcome: Adequate for Discharge 12/09/2020 1421 by Mickie Kay, RN Outcome: Adequate for Discharge Goal: Cardiovascular complication will be avoided 12/09/2020 1421 by Mickie Kay, RN Outcome: Adequate for Discharge 12/09/2020 1421 by Mickie Kay, RN Outcome: Adequate for Discharge   Problem: Activity: Goal: Risk for activity intolerance will decrease 12/09/2020 1421 by Mickie Kay, RN Outcome: Adequate for Discharge 12/09/2020 1421 by Mickie Kay, RN Outcome: Adequate for Discharge   Problem: Nutrition: Goal: Adequate nutrition will be maintained 12/09/2020 1421 by Mickie Kay, RN Outcome:  Adequate for Discharge 12/09/2020 1421 by Mickie Kay, RN Outcome: Adequate for Discharge   Problem: Coping: Goal: Level of anxiety will decrease 12/09/2020 1421 by Mickie Kay, RN Outcome: Adequate for Discharge 12/09/2020 1421 by Mickie Kay, RN Outcome: Adequate for Discharge   Problem: Elimination: Goal: Will not experience complications related to bowel motility Outcome: Adequate for Discharge Goal: Will not experience complications related to urinary retention Outcome: Adequate for Discharge   Problem: Pain Managment: Goal: General experience of comfort will improve Outcome: Adequate for Discharge   Problem: Safety: Goal: Ability to remain free from injury will improve Outcome: Adequate for Discharge   Problem: Skin Integrity: Goal: Risk for impaired skin integrity will decrease Outcome: Adequate for Discharge

## 2020-12-09 NOTE — Progress Notes (Signed)
D/C instructions given and reviewed. No questions asked but encouraged to call with any concerns. Tele and IV removed, tolerated well. 

## 2020-12-09 NOTE — Discharge Summary (Signed)
Physician Discharge Summary  [name] [name] ZOX:[id number] DOB: 1946-06-18 DOA: 76/12/2020  PCP: Shon Baton, MD  Admit date: 12/07/2020 Discharge date: 12/09/2020  Admitted From: Home Disposition: Home   Recommendations for Outpatient Follow-up:  1. Follow up with PCP in 1-2 weeks 2. Follow-up with cardiology 3. Please obtain BMP/CBC in one week 4. Please follow up on the following pending results: None  Home Health: No Equipment/Devices: None Discharge Condition: Stable CODE STATUS: Full Diet recommendation: Heart Healthy / Carb Modified   Brief/Interim Summary: [name] [name] a 75 y.o.malewith medical history significant foraortic stenosis s/p mechanical AVR on chronic Coumadin, CAD s/p CABG x3, diverticulitis, hyperlipidemia, hypertension, PAD, renal artery stenosis, stroke, diabetes, who presentsby EMSfor evaluation of shortness of breath and chest pressure. States that symptoms started around 4 [facility] 12/07/20. Chest pressure locatedinthe center of the chest and hebecame short of breath.He was also diaphoretic.Chest pain did not radiate.States hefelt like hehad some congestion in his lungs.He was noted to be in A. fib with a heart rate of 110-135by EMS and was mildlyhypoxic at 90% on room air. Hewas placed on 4 L O2 and hissats increased to 98%.Blood pressure was found to be significantly elevated in the 811 systolic range.He was placed on NTG infusion in the ED, resulted in improvement in his blood pressure. BNP mildly elevated.  CTA was negative for PE but did show an irregular 1.5 cm lung nodule which appears larger than previous scan and need further outpatient work-up with PET scan. Repeat echocardiogram without any acute abnormality. He was managed with IV Lasix while in the hospital, resulted in improvement in his symptoms.  He was saturating well on room air. He was discharged on increased dose of Lasix from 20 mg daily to 40 mg daily and will  follow up with his cardiologist and primary care provider.  He will continue with rest of his home medications and follow-up with his providers.   Discharge Diagnoses:  Principal Problem:   Acute on chronic heart failure with preserved ejection fraction (HFpEF) (HCC) Active Problems:   Coronary artery disease   H/O mechanical aortic valve replacement   Diabetes mellitus type 2, controlled (HCC)   Hypertensive urgency   Chronic anticoagulation   AKI (acute kidney injury) ([place])   Lung nodule   Discharge Instructions  Discharge Instructions    Avoid straining   Complete by: As directed    Diet - low sodium heart healthy   Complete by: As directed    Discharge instructions   Complete by: As directed    It was pleasure taking care of you. We are increasing the dose of Lasix from 20 to 40 mg daily. Follow-up with your cardiologist and primary care provider.   Discharge instructions   Complete by: As directed    It was pleasure taking care of you. As we discussed we are increasing the dose of Lasix to 40 mg daily, please follow-up closely with your cardiologist for further recommendations.   Heart Failure patients record your daily weight using the same scale at the same time of day   Complete by: As directed    Heart Failure patients record your daily weight using the same scale at the same time of day   Complete by: As directed    Increase activity slowly   Complete by: As directed    Increase activity slowly   Complete by: As directed      Allergies as of 12/09/2020   No Known Allergies  Medication List    STOP taking these medications   amoxicillin 500 MG capsule Commonly known as: AMOXIL     TAKE these medications   amiodarone 100 MG tablet Commonly known as: PACERONE Take 1 tablet (100 mg total) by mouth daily.   atorvastatin 80 MG tablet Commonly known as: LIPITOR Take 80 mg by mouth daily.   clopidogrel 75 MG tablet Commonly known as: PLAVIX TAKE 1  TABLET BY MOUTH EVERY DAY   Entresto 24-26 MG Generic drug: sacubitril-valsartan Take 1 tablet by mouth 2 (two) times daily.   Evolocumab 140 MG/ML Soaj Inject 140 mg into the skin every 14 (fourteen) days. 1st and 15th of each month   furosemide 20 MG tablet Commonly known as: LASIX Take 2 tablets (40 mg total) by mouth daily. What changed: how much to take   glyBURIDE 2.5 MG tablet Commonly known as: DIABETA Take 5 mg by mouth 2 (two) times daily with a meal.   Invokana 100 MG Tabs tablet Generic drug: canagliflozin Take 100 mg by mouth daily.   nitroGLYCERIN 0.4 MG SL tablet Commonly known as: NITROSTAT Place 1 tablet (0.4 mg total) under the tongue every 5 (five) minutes x 3 doses as needed for chest pain.   warfarin 5 MG tablet Commonly known as: COUMADIN Take as directed. If you are unsure how to take this medication, talk to your nurse or doctor. Original instructions: TAKE AS DIRECTED BY COUMADIN CLINIC What changed: See the new instructions.       Follow-up Information    Shon Baton, MD. Schedule an appointment as soon as possible for a visit.   Specialty: Internal Medicine Contact information: [place] [address] [phone]        Josue Hector, MD .   Specialty: Cardiology Contact information: [phone] [place] [address] [phone]              No Known Allergies  Consultations:  None  Procedures/Studies: CT Angio Chest PE W and/or Wo Contrast  Result Date: 12/08/2020 CLINICAL DATA:  Concern for pulmonary embolism. Dyspnea. Chest pressure. EXAM: CT ANGIOGRAPHY CHEST WITH CONTRAST TECHNIQUE: Multidetector CT imaging of the chest was performed using the standard protocol during bolus administration of intravenous contrast. Multiplanar CT image reconstructions and MIPs were obtained to evaluate the vascular anatomy. CONTRAST:  57mL OMNIPAQUE IOHEXOL 350 MG/ML SOLN COMPARISON:  Chest radiograph  from earlier today. 09/16/2019 chest CT angiogram. 11/13/2020 coronary CT. FINDINGS: Cardiovascular: The study is high quality for the evaluation of pulmonary embolism. There are no filling defects in the central, lobar, segmental or subsegmental pulmonary artery branches to suggest acute pulmonary embolism. Atherosclerotic nonaneurysmal thoracic aorta. Top-normal caliber main pulmonary artery (3.2 cm diameter). Top-normal heart size. No significant pericardial fluid/thickening. Aortic valve prosthesis in place. Left main and three-vessel coronary atherosclerosis status post CABG. Mediastinum/Nodes: No discrete thyroid nodules. Unremarkable esophagus. No pathologically enlarged axillary, mediastinal or hilar lymph nodes. Lungs/Pleura: No pneumothorax. No pleural effusion. Moderate centrilobular emphysema with diffuse bronchial wall thickening. Saber sheath trachea. No acute consolidative airspace disease or lung masses. Irregular 1.5 cm lingular solid pulmonary nodule (series 6/image 90), increased from 0.9 cm on 09/16/2019 CT. Peripheral right middle lobe 0.6 cm solid pulmonary nodule (series 6/image 109) is stable and considered benign. No additional significant pulmonary nodules. Upper abdomen: Contrast reflux into the IVC and hepatic veins. Colonic diverticulosis. Musculoskeletal: No aggressive appearing focal osseous lesions. Intact sternotomy wires. Mild thoracic spondylosis. Review of the  MIP images confirms the above findings. IMPRESSION: 1. No pulmonary embolism. 2. Irregular 1.5 cm lingular solid pulmonary nodule, increased since 09/16/2019 CT, cannot exclude primary bronchogenic carcinoma. PET-CT suggested for further evaluation. 3. Moderate centrilobular emphysema with diffuse bronchial wall thickening and saber sheath trachea, suggesting COPD. 4. Contrast reflux into the IVC and hepatic veins, suggesting elevated central venous pressures/right heart failure. 5. Aortic Atherosclerosis (ICD10-I70.0) and  Emphysema (ICD10-J43.9). Electronically Signed   By: Ilona Sorrel M.D.   On: 12/08/2020 04:38   EP STUDY  Result Date: 11/20/2020 SURGEON:  Allegra Lai, MD PREPROCEDURE DIAGNOSES: 1. Paroxysmal atrial fibrillation. POSTPROCEDURE DIAGNOSES: 1. Paroxysmal  atrial fibrillation. PROCEDURES: 1. Comprehensive electrophysiologic study. 2. Coronary sinus pacing and recording. 3. Three-dimensional mapping of atrial fibrillation with additional mapping and ablation of a second discrete focus 4. Ablation of atrial fibrillation with additional mapping and ablation of a second discrete focus 5. Intracardiac echocardiography. 6. Transseptal puncture of an intact septum. 7. Arrhythmia induction with pacing with dobutamine infusion INTRODUCTION:  [name] [name] is a 75 y.o. male with a history of paroxysmal atrial fibrillation who now presents for EP study and radiofrequency ablation.  The patient reports initially being diagnosed with atrial fibrillation after presenting with symptomatic palpitations and fatgiue. The patient reports increasing frequency and duration of atrial fibrillation since that time.  The patient has failed medical therapy.  The patient therefore presents today for catheter ablation of atrial fibrillation. DESCRIPTION OF PROCEDURE:  Informed written consent was obtained, and the patient was brought to the electrophysiology lab in a fasting state.  The patient was adequately sedated with intravenous medications as outlined in the anesthesia report.  The patient's left and right groins were prepped and draped in the usual sterile fashion by the EP lab staff.  Using a percutaneous Seldinger technique, two 8-French hemostasis sheaths were placed in the right femoral vein, and one 7 [place] and one 11-French hemostasis sheaths were placed into the left common femoral vein. An esophageal temperature probe was inserted to monitor for heating of the esophagus during the procedure. Direct ultrasound  guidance is used for right and left femoral veins with normal vessel patency. Ultrasound images are captured and stored in the patient's chart. Using ultrasound guidance, the Brockenbrough needle and wire were visualized entering the vessel. Catheter Placement:  A 7-French Biosense Webster Decapolar coronary sinus catheter was introduced through the right common femoral vein and advanced into the coronary sinus for recording and pacing from this location.  Initial Measurements: The patient presented to the electrophysiology lab in sinus rhythm. his  PR interval measured 236 msec with a QRS duration of 108 msec and a QT interval of 462 msec.    Intracardiac Echocardiography: A 10-French Biosense Webster AcuNav intracardiac echocardiography catheter was introduced through the right common femoral vein and advanced into the right atrium. Intracardiac echocardiography was performed of the left atrium, and a three-dimensional anatomical rendering of the left atrium was performed using CARTO sound technology.  The patient was noted to have a moderate sized left atrium.  The interatrial septum was prominent but not aneurysmal. All 4 pulmonary veins were visualized and noted to have separate ostia.  The pulmonary veins were moderate in size.  The left atrial appendage was visualized and did not reveal thrombus.   There was no evidence of pulmonary vein stenosis. Transseptal Puncture: The right common femoral vein sheaths were exchanged for two 8.5 French Agillis transseptal sheath and transseptal access was achieved in a standard fashion using  a Brockenbrough needle under fluoroscopy with intracardiac echocardiography confirmation of the transseptal puncture.  Once transseptal access had been achieved, heparin was administered intravenously and intra- arterially in order to maintain an ACT of greater than 350 seconds throughout the procedure. 3D Mapping and Ablation: The His bundle catheter was removed and in its place a  3.5 mm [employer] Thermocool ablation catheter was advanced into the right atrium.  The transseptal sheath was pulled back into the IVC over a guidewire.  The ablation catheter was advanced across the transseptal hole using the wire as a guide.  The transseptal sheath was then re-advanced over the guidewire into the left atrium.  A duodecapolar Biosense Webster pentarray mapping catheter was introduced through the transseptal sheath and positioned over the mouth of all 4 pulmonary veins.  Three-dimensional electroanatomical mapping was performed using CARTO technology.  This demonstrated electrical activity within all four pulmonary veins at baseline. The patient underwent successful sequential electrical isolation and anatomical encircling of all four pulmonary veins using radiofrequency current with a circular mapping catheter as a guide.  A WACA approach was used.  Entrance and exit block were confirmed. Cardioversion: The patient was then cardioverted to sinus rhythm with a single synchronized 360-J biphasic shock with cardioversion electrodes in the anterior-posterior thoracic configuration. He maintained sinus rhythm initially but then had recurence of afib with catheter manipulation within the left atrial, requiring repeat cardioversion with 360J biphasic.  He remained in sinus rhythm thereafter. Measurements Following Ablation: Following ablation, dobutamine was infused up to 20 mcg/kg/min with no inducible atrial fibrillation, atrial tachycardia, atrial flutter, or sustained PACs. In sinus rhythm with RR interval was 868 msec, with PR 194 msec, QRS 115 msec, and QT 471 msec.  Following ablation the AH interval measured 178msec with an HV interval of 45 msec. Ventricular pacing was performed, which revealed midline decremental VA conduction with a VA Wenckebach cycle length of 600 msec. Rapid atrial pacing was performed, which revealed an AV Wenckebach cycle length of 600 msec.   Electroisolation was then again confirmed in all four pulmonary veins.  Pacing was performed along the ablation line which confirmed entrance and exit block. The procedure was therefore considered completed.  All catheters were removed, and the sheaths were aspirated and flushed.  The patient was transferred to the recovery area for sheath removal per protocol. EBL<15ml.  Intracardiac echocardiogram revealed no pericardial effusion.  There were no early apparent complications. CONCLUSIONS: 1. Sinus rhythm upon presentation.  2. Successful electrical isolation and anatomical encircling of all four pulmonary veins with radiofrequency current. 3. No inducible arrhythmias following ablation both on and off of dobutamine 4. No early apparent complications. Will Martin Camnitz,MD 5:02 PM 11/20/2020   CT CARDIAC MORPH/PULM VEIN W/CM&W/O CA SCORE  Addendum Date: 11/15/2020   ADDENDUM REPORT: 11/15/2020 15:58 CLINICAL DATA:  Atrial fibrillation scheduled for an ablation. EXAM: Cardiac CT/CTA TECHNIQUE: The patient was scanned on a Siemens Somatom scanner. FINDINGS: A 120 kV prospective scan was triggered in the descending thoracic aorta at 111 HU's. Gantry rotation speed was 280 msecs and collimation was .9 mm. No beta blockade and no NTG was given. The 3D data set was reconstructed in 5% intervals of the 0-95 % of the R-R cycle. Diastolic phases were analyzed on a dedicated work station using MPR, MIP and VRT modes. The patient received 2mL OMNIPAQUE IOHEXOL 350 MG/ML SOLN. There is normal variant pulmonary vein drainage into the left atrium (3 on the right and 2 on  the left) with ostial measurements as follows: RUPV: 15.7 x 11.5 mm, area 1.38 cm2 RMLPV: 11.9 x 8.57 mm, area 0.77 cm2 RLPV: 22 x 17.4 mm, area 3 cm2 LUPV: 21.4 x 13.3 mm, area 2.35 cm2 LLPV: 19.8 x 12.8 mm, area 2 cm2 No anomalous pulmonary venous drainage. No pulmonary vein stenosis. Normal left atrial appendage, no left atrial appendage thrombus. No  intracardiac mass or thrombus. The esophagus runs in the left atrial midline and is not in the proximity to any of the pulmonary veins. Aorta: Severe, diffuse mixed atherosclerotic plaque. Aortic Valve: Mechanical aortic valve appears well seated, with normal leaflet motion. Coronary Arteries: S/p CABG. LIMA to LAD and SVG to Right system appear patent. Unable to comment further due to arrhythmia artifact. Pulmonary artery: Mildly dilated main pulmonary artery. IMPRESSION: 1. There is normal variant pulmonary vein drainage into the left atrium. No pulmonary vein stenosis. 2. Normal left atrial appendage, no left atrial appendage thrombus. No intracardiac mass or thrombus. 3. The esophagus runs in the left atrial midline and is not in the proximity to any of the pulmonary veins. 4. Normal appearance of mechanical aortic prosthesis. 5. Mildly dilated main pulmonary artery. Electronically Signed   By: Cherlynn Kaiser   On: 11/15/2020 15:58   Result Date: 11/15/2020 EXAM: OVER-READ INTERPRETATION  CT CHEST The following report is an over-read performed by radiologist Dr. Vinnie Langton of [facility], [place] on 11/13/2020. This over-read does not include interpretation of cardiac or coronary anatomy or pathology. The coronary calcium score/coronary CTA interpretation by the cardiologist is attached. COMPARISON:  Chest CT 09/16/2019. FINDINGS: Aortic atherosclerosis. 5 mm subpleural nodule in the periphery of the right middle lobe (axial image 33 of series 11), stable compared to the prior examination from 09/16/2019, considered definitively benign (presumably a subpleural lymph node). Within the visualized portions of the thorax there are no other suspicious appearing pulmonary nodules or masses, there is no acute consolidative airspace disease, no pleural effusions, no pneumothorax and no lymphadenopathy. Visualized portions of the upper abdomen are unremarkable. There are no aggressive appearing lytic or  blastic lesions noted in the visualized portions of the skeleton. Status post median sternotomy for CABG. IMPRESSION: 1.  Aortic Atherosclerosis (ICD10-I70.0). Electronically Signed: By: Vinnie Langton M.D. On: 11/13/2020 15:01   DG Chest Portable 1 View  Result Date: 12/08/2020 CLINICAL DATA:  Dyspnea, chest pressure EXAM: PORTABLE CHEST 1 VIEW COMPARISON:  03/05/2020 FINDINGS: Advanced vascular calcifications noted within the lung apices bilaterally. Lungs are otherwise clear. No pneumothorax or pleural effusion. Coronary artery bypass grafting has been performed. Cardiac size within normal limits. Pulmonary vascularity is normal. No acute bone abnormality. IMPRESSION: No active disease. Electronically Signed   By: Fidela Salisbury MD   On: 12/08/2020 00:23   ECHOCARDIOGRAM COMPLETE  Result Date: 12/08/2020    ECHOCARDIOGRAM REPORT   Patient Name:   [name] [name] Date of Exam: 12/08/2020 Medical Rec #:  [id number]             Height:       68.0 in Accession #:    [id number]            Weight:       174.0 lb Date of Birth:  Feb 06, 1946             BSA:          1.926 m Patient Age:    10 years  BP:           144/37 mmHg Patient Gender: M                     HR:           67 bpm. Exam Location:  Inpatient Procedure: 2D Echo, Color Doppler and Cardiac Doppler Indications:    CHF-Acute Diastolic T90.30  History:        Patient has prior history of Echocardiogram examinations, most                 recent 05/30/2020. CHF, CAD, Stroke, TIA and COPD; Risk                 Factors:Diabetes, Hypertension and Dyslipidemia.                 Aortic Valve: mechanical valve is present in the aortic                 position.  Sonographer:    Bernadene Person RDCS Referring Phys: [id number] [place]  1. Normal LV systolic function; s/p AVR with normal mean gradient (8 mmHg) and no AI.  2. Left ventricular ejection fraction, by estimation, is 60 to 65%. The left ventricle has normal function. The  left ventricle has no regional wall motion abnormalities. There is moderate left ventricular hypertrophy. Left ventricular diastolic parameters are indeterminate. Elevated left atrial pressure.  3. Right ventricular systolic function is normal. The right ventricular size is normal.  4. Left atrial size was moderately dilated.  5. The mitral valve is normal in structure. No evidence of mitral valve regurgitation. No evidence of mitral stenosis.  6. The aortic valve has been repaired/replaced. Aortic valve regurgitation is not visualized. No aortic stenosis is present. There is a mechanical valve present in the aortic position.  7. The inferior vena cava is normal in size with greater than 50% respiratory variability, suggesting right atrial pressure of 3 mmHg. FINDINGS  Left Ventricle: Left ventricular ejection fraction, by estimation, is 60 to 65%. The left ventricle has normal function. The left ventricle has no regional wall motion abnormalities. The left ventricular internal cavity size was normal in size. There is  moderate left ventricular hypertrophy. Left ventricular diastolic parameters are indeterminate. Elevated left atrial pressure. Right Ventricle: The right ventricular size is normal. Right ventricular systolic function is normal. Left Atrium: Left atrial size was moderately dilated. Right Atrium: Right atrial size was normal in size. Pericardium: There is no evidence of pericardial effusion. Mitral Valve: The mitral valve is normal in structure. Mild mitral annular calcification. No evidence of mitral valve regurgitation. No evidence of mitral valve stenosis. Tricuspid Valve: The tricuspid valve is normal in structure. Tricuspid valve regurgitation is trivial. No evidence of tricuspid stenosis. Aortic Valve: The aortic valve has been repaired/replaced. Aortic valve regurgitation is not visualized. No aortic stenosis is present. There is a mechanical valve present in the aortic position. Pulmonic Valve:  The pulmonic valve was normal in structure. Pulmonic valve regurgitation is trivial. No evidence of pulmonic stenosis. Aorta: The aortic root is normal in size and structure. Venous: The inferior vena cava is normal in size with greater than 50% respiratory variability, suggesting right atrial pressure of 3 mmHg.  Additional Comments: Normal LV systolic function; s/p AVR with normal mean gradient (8 mmHg) and no AI.  LEFT VENTRICLE PLAX 2D LVIDd:         4.00 cm  Diastology LVIDs:  2.80 cm  LV e' medial:    5.70 cm/s LV PW:         1.60 cm  LV E/e' medial:  19.6 LV IVS:        1.20 cm  LV e' lateral:   5.48 cm/s LVOT diam:     2.00 cm  LV E/e' lateral: 20.4 LV SV:         62 LV SV Index:   32 LVOT Area:     3.14 cm  RIGHT VENTRICLE RV S prime:     8.27 cm/s TAPSE (M-mode): 1.5 cm LEFT ATRIUM             Index       RIGHT ATRIUM           Index LA diam:        4.60 cm 2.39 cm/m  RA Area:     15.10 cm LA Vol (A2C):   69.1 ml 35.87 ml/m RA Volume:   33.20 ml  17.24 ml/m LA Vol (A4C):   79.8 ml 41.43 ml/m LA Biplane Vol: 75.4 ml 39.14 ml/m  AORTIC VALVE LVOT Vmax:   92.00 cm/s LVOT Vmean:  66.300 cm/s LVOT VTI:    0.198 m  AORTA Ao Root diam: 3.30 cm Ao Asc diam:  3.00 cm MITRAL VALVE MV Area (PHT): 2.17 cm     SHUNTS MV Decel Time: 349 msec     Systemic VTI:  0.20 m MV E velocity: 112.00 cm/s  Systemic Diam: 2.00 cm MV A velocity: 54.80 cm/s MV E/A ratio:  2.04 Kirk Ruths MD Electronically signed by Kirk Ruths MD Signature Date/Time: 12/08/2020/10:54:31 AM    Final      Subjective: Patient is feeling better when seen today.  No chest pain or shortness of breath.  Wants to go home.  Discharge Exam: Vitals:   12/09/20 0432 12/09/20 0817  BP: (!) 133/50 (!) 136/51  Pulse: 66 (!) 48  Resp: 20 20  Temp: 98.4 F (36.9 C) 97.9 F (36.6 C)  SpO2: 90% 94%   Vitals:   12/08/20 2047 12/09/20 0051 12/09/20 0432 12/09/20 0817  BP: (!) 155/48 (!) 119/55 (!) 133/50 (!) 136/51  Pulse: 82 67 66  (!) 48  Resp: 18 18 20 20   Temp: 98 F (36.7 C) 98.4 F (36.9 C) 98.4 F (36.9 C) 97.9 F (36.6 C)  TempSrc: Oral Oral Oral Oral  SpO2: 93% 94% 90% 94%  Weight:   76.7 kg   Height:        General: Pt is alert, awake, not in acute distress Cardiovascular: RRR, S1/S2 +, no rubs, no gallops Respiratory: CTA bilaterally, no wheezing, no rhonchi Abdominal: Soft, NT, ND, bowel sounds + Extremities: no edema, no cyanosis   The results of significant diagnostics from this hospitalization (including imaging, microbiology, ancillary and laboratory) are listed below for reference.    Microbiology: Recent Results (from the past 240 hour(s))  Resp Panel by RT-PCR (Flu A&B, Covid) Nasopharyngeal Swab     Status: None   Collection Time: 12/08/20 12:32 AM   Specimen: Nasopharyngeal Swab; Nasopharyngeal(NP) swabs in vial transport medium  Result Value Ref Range Status   SARS Coronavirus 2 by RT PCR NEGATIVE NEGATIVE Final    Comment: (NOTE) SARS-CoV-2 target nucleic acids are NOT DETECTED.  The SARS-CoV-2 RNA is generally detectable in upper respiratory specimens during the acute phase of infection. The lowest concentration of SARS-CoV-2 viral copies this assay can detect is 138 copies/mL. A  negative result does not preclude SARS-Cov-2 infection and should not be used as the sole basis for treatment or other patient management decisions. A negative result may occur with  improper specimen collection/handling, submission of specimen other than nasopharyngeal swab, presence of viral mutation(s) within the areas targeted by this assay, and inadequate number of viral copies(<138 copies/mL). A negative result must be combined with clinical observations, patient history, and epidemiological information. The expected result is Negative.  Fact Sheet for Patients:  EntrepreneurPulse.com.au  Fact Sheet for Healthcare Providers:  IncredibleEmployment.be  This  test is no t yet approved or cleared by the [place] FDA and  has been authorized for detection and/or diagnosis of SARS-CoV-2 by FDA under an Emergency Use Authorization (EUA). This EUA will remain  in effect (meaning this test can be used) for the duration of the COVID-19 declaration under Section 564(b)(1) of the Act, 21 U.S.C.section 360bbb-3(b)(1), unless the authorization is terminated  or revoked sooner.       Influenza A by PCR NEGATIVE NEGATIVE Final   Influenza B by PCR NEGATIVE NEGATIVE Final    Comment: (NOTE) The Xpert Xpress SARS-CoV-2/FLU/RSV plus assay is intended as an aid in the diagnosis of influenza from Nasopharyngeal swab specimens and should not be used as a sole basis for treatment. Nasal washings and aspirates are unacceptable for Xpert Xpress SARS-CoV-2/FLU/RSV testing.  Fact Sheet for Patients: EntrepreneurPulse.com.au  Fact Sheet for Healthcare Providers: IncredibleEmployment.be  This test is not yet approved or cleared by the [place] FDA and has been authorized for detection and/or diagnosis of SARS-CoV-2 by FDA under an Emergency Use Authorization (EUA). This EUA will remain in effect (meaning this test can be used) for the duration of the COVID-19 declaration under Section 564(b)(1) of the Act, 21 U.S.C. section 360bbb-3(b)(1), unless the authorization is terminated or revoked.  Performed at [facility], [place] [address], [place], [place] [address]      Labs: BNP (last 3 results) Recent Labs    03/05/20 1125 12/07/20 2356  BNP 457.7* 818.5*   Basic Metabolic Panel: Recent Labs  Lab 12/08/20 0141 12/09/20 0423  NA 140 138  K 3.9 4.0  CL 103 99  CO2 23 24  GLUCOSE 159* 124*  BUN 24* 26*  CREATININE 1.70* 1.30*  CALCIUM 8.8* 8.5*  MG 1.9  --    Liver Function Tests: Recent Labs  Lab 12/08/20 0141  AST 27  ALT 28  ALKPHOS 69  BILITOT 1.4*  PROT 6.9  ALBUMIN 3.6   No  results for input(s): LIPASE, AMYLASE in the last 168 hours. No results for input(s): AMMONIA in the last 168 hours. CBC: Recent Labs  Lab 12/07/20 2356  WBC 10.8*  NEUTROABS 7.3  HGB 16.5  HCT 48.0  MCV 94.7  PLT 306   Cardiac Enzymes: No results for input(s): CKTOTAL, CKMB, CKMBINDEX, TROPONINI in the last 168 hours. BNP: Invalid input(s): POCBNP CBG: Recent Labs  Lab 12/08/20 1200 12/08/20 1720 12/08/20 2119 12/09/20 0541 12/09/20 1142  GLUCAP 117* 194* 164* 142* 227*   D-Dimer No results for input(s): DDIMER in the last 72 hours. Hgb A1c Recent Labs    12/08/20 1725  HGBA1C 7.1*   Lipid Profile No results for input(s): CHOL, HDL, LDLCALC, TRIG, CHOLHDL, LDLDIRECT in the last 72 hours. Thyroid function studies No results for input(s): TSH, T4TOTAL, T3FREE, THYROIDAB in the last 72 hours.  Invalid input(s): FREET3 Anemia work up No results for input(s): VITAMINB12, FOLATE, FERRITIN, TIBC, IRON,  RETICCTPCT in the last 72 hours. Urinalysis    Component Value Date/Time   COLORURINE YELLOW 09/16/2019 1230   APPEARANCEUR CLEAR 09/16/2019 1230   LABSPEC 1.027 09/16/2019 1230   PHURINE 7.0 09/16/2019 1230   GLUCOSEU >=500 (A) 09/16/2019 1230   HGBUR NEGATIVE 09/16/2019 1230   BILIRUBINUR NEGATIVE 09/16/2019 1230   KETONESUR 5 (A) 09/16/2019 1230   PROTEINUR NEGATIVE 09/16/2019 1230   UROBILINOGEN 0.2 01/15/2012 1415   NITRITE NEGATIVE 09/16/2019 1230   LEUKOCYTESUR NEGATIVE 09/16/2019 1230   Sepsis Labs Invalid input(s): PROCALCITONIN,  WBC,  LACTICIDVEN Microbiology Recent Results (from the past 240 hour(s))  Resp Panel by RT-PCR (Flu A&B, Covid) Nasopharyngeal Swab     Status: None   Collection Time: 12/08/20 12:32 AM   Specimen: Nasopharyngeal Swab; Nasopharyngeal(NP) swabs in vial transport medium  Result Value Ref Range Status   SARS Coronavirus 2 by RT PCR NEGATIVE NEGATIVE Final    Comment: (NOTE) SARS-CoV-2 target nucleic acids are NOT  DETECTED.  The SARS-CoV-2 RNA is generally detectable in upper respiratory specimens during the acute phase of infection. The lowest concentration of SARS-CoV-2 viral copies this assay can detect is 138 copies/mL. A negative result does not preclude SARS-Cov-2 infection and should not be used as the sole basis for treatment or other patient management decisions. A negative result may occur with  improper specimen collection/handling, submission of specimen other than nasopharyngeal swab, presence of viral mutation(s) within the areas targeted by this assay, and inadequate number of viral copies(<138 copies/mL). A negative result must be combined with clinical observations, patient history, and epidemiological information. The expected result is Negative.  Fact Sheet for Patients:  EntrepreneurPulse.com.au  Fact Sheet for Healthcare Providers:  IncredibleEmployment.be  This test is no t yet approved or cleared by the [place] FDA and  has been authorized for detection and/or diagnosis of SARS-CoV-2 by FDA under an Emergency Use Authorization (EUA). This EUA will remain  in effect (meaning this test can be used) for the duration of the COVID-19 declaration under Section 564(b)(1) of the Act, 21 U.S.C.section 360bbb-3(b)(1), unless the authorization is terminated  or revoked sooner.       Influenza A by PCR NEGATIVE NEGATIVE Final   Influenza B by PCR NEGATIVE NEGATIVE Final    Comment: (NOTE) The Xpert Xpress SARS-CoV-2/FLU/RSV plus assay is intended as an aid in the diagnosis of influenza from Nasopharyngeal swab specimens and should not be used as a sole basis for treatment. Nasal washings and aspirates are unacceptable for Xpert Xpress SARS-CoV-2/FLU/RSV testing.  Fact Sheet for Patients: EntrepreneurPulse.com.au  Fact Sheet for Healthcare Providers: IncredibleEmployment.be  This test is not yet  approved or cleared by the [place] FDA and has been authorized for detection and/or diagnosis of SARS-CoV-2 by FDA under an Emergency Use Authorization (EUA). This EUA will remain in effect (meaning this test can be used) for the duration of the COVID-19 declaration under Section 564(b)(1) of the Act, 21 U.S.C. section 360bbb-3(b)(1), unless the authorization is terminated or revoked.  Performed at [facility], [place] [address] [place], [place] [address]     Time coordinating discharge: Over 30 minutes  SIGNED:  Lorella Nimrod, MD  [facility] 12/09/2020, 1:59 PM  If 7PM-7AM, please contact night-coverage [email or web address]  This record has been created using Systems analyst. Errors have been sought and corrected,but may not always be located. Such creation errors do not reflect on the standard of care.

## 2020-12-10 ENCOUNTER — Other Ambulatory Visit: Payer: Self-pay | Admitting: Internal Medicine

## 2020-12-10 ENCOUNTER — Other Ambulatory Visit (HOSPITAL_COMMUNITY): Payer: Self-pay | Admitting: Cardiology

## 2020-12-10 ENCOUNTER — Other Ambulatory Visit (HOSPITAL_COMMUNITY): Payer: Self-pay | Admitting: Internal Medicine

## 2020-12-10 DIAGNOSIS — R911 Solitary pulmonary nodule: Secondary | ICD-10-CM

## 2020-12-13 ENCOUNTER — Other Ambulatory Visit: Payer: Self-pay

## 2020-12-13 ENCOUNTER — Ambulatory Visit (HOSPITAL_COMMUNITY)
Admission: RE | Admit: 2020-12-13 | Discharge: 2020-12-13 | Disposition: A | Discharge status: Home / Self Care | Payer: Medicare Other | Source: Ambulatory Visit | Attending: Internal Medicine | Admitting: Internal Medicine

## 2020-12-13 DIAGNOSIS — I700 Atherosclerosis of aorta: Secondary | ICD-10-CM | POA: Insufficient documentation

## 2020-12-13 DIAGNOSIS — I2510 Atherosclerotic heart disease of native coronary artery without angina pectoris: Secondary | ICD-10-CM | POA: Insufficient documentation

## 2020-12-13 DIAGNOSIS — R911 Solitary pulmonary nodule: Secondary | ICD-10-CM

## 2020-12-13 DIAGNOSIS — M47816 Spondylosis without myelopathy or radiculopathy, lumbar region: Secondary | ICD-10-CM | POA: Diagnosis not present

## 2020-12-13 LAB — GLUCOSE, CAPILLARY: Glucose-Capillary: 160 mg/dL — ABNORMAL HIGH (ref 70–99)

## 2020-12-13 MED ORDER — FLUDEOXYGLUCOSE F - 18 (FDG) INJECTION
8.2600 | Freq: Once | INTRAVENOUS | Status: AC | PRN
  Administered 2020-12-13: 8.26 via INTRAVENOUS

## 2020-12-16 ENCOUNTER — Other Ambulatory Visit: Payer: Self-pay

## 2020-12-16 ENCOUNTER — Ambulatory Visit (INDEPENDENT_AMBULATORY_CARE_PROVIDER_SITE_OTHER): Payer: Medicare Other | Admitting: *Deleted

## 2020-12-16 DIAGNOSIS — Z5181 Encounter for therapeutic drug level monitoring: Secondary | ICD-10-CM | POA: Diagnosis not present

## 2020-12-16 DIAGNOSIS — I359 Nonrheumatic aortic valve disorder, unspecified: Secondary | ICD-10-CM

## 2020-12-16 LAB — POCT INR: INR: 2.8 (ref 2.0–3.0)

## 2020-12-16 NOTE — Patient Instructions (Signed)
Description   Continue taking Warfarin 1 tablet daily except for 1.5 tablets on Sundays, Tuesdays and Thursdays. Recheck INR in 2 weeks. Coumadin Clinic for any changes in medications or upcoming procedures. [phone]

## 2020-12-18 DIAGNOSIS — S61412A Laceration without foreign body of left hand, initial encounter: Secondary | ICD-10-CM | POA: Diagnosis not present

## 2020-12-25 ENCOUNTER — Encounter (HOSPITAL_COMMUNITY): Payer: Self-pay | Admitting: Cardiology

## 2020-12-25 ENCOUNTER — Other Ambulatory Visit (HOSPITAL_COMMUNITY): Payer: Self-pay

## 2020-12-25 ENCOUNTER — Other Ambulatory Visit: Payer: Self-pay

## 2020-12-25 ENCOUNTER — Ambulatory Visit (INDEPENDENT_AMBULATORY_CARE_PROVIDER_SITE_OTHER): Payer: Medicare Other | Admitting: Cardiology

## 2020-12-25 ENCOUNTER — Ambulatory Visit (HOSPITAL_COMMUNITY)
Admission: RE | Admit: 2020-12-25 | Discharge: 2020-12-25 | Disposition: A | Discharge status: Home / Self Care | Payer: Medicare Other | Source: Ambulatory Visit | Attending: Cardiology | Admitting: Cardiology

## 2020-12-25 VITALS — BP 140/70 | HR 100 | Wt 177.0 lb

## 2020-12-25 DIAGNOSIS — I480 Paroxysmal atrial fibrillation: Secondary | ICD-10-CM

## 2020-12-25 DIAGNOSIS — I472 Ventricular tachycardia: Secondary | ICD-10-CM | POA: Diagnosis not present

## 2020-12-25 DIAGNOSIS — Z7902 Long term (current) use of antithrombotics/antiplatelets: Secondary | ICD-10-CM | POA: Diagnosis not present

## 2020-12-25 DIAGNOSIS — I255 Ischemic cardiomyopathy: Secondary | ICD-10-CM | POA: Insufficient documentation

## 2020-12-25 DIAGNOSIS — Z8249 Family history of ischemic heart disease and other diseases of the circulatory system: Secondary | ICD-10-CM | POA: Insufficient documentation

## 2020-12-25 DIAGNOSIS — I110 Hypertensive heart disease with heart failure: Secondary | ICD-10-CM | POA: Diagnosis not present

## 2020-12-25 DIAGNOSIS — Z5181 Encounter for therapeutic drug level monitoring: Secondary | ICD-10-CM

## 2020-12-25 DIAGNOSIS — I484 Atypical atrial flutter: Secondary | ICD-10-CM | POA: Insufficient documentation

## 2020-12-25 DIAGNOSIS — I5032 Chronic diastolic (congestive) heart failure: Secondary | ICD-10-CM

## 2020-12-25 DIAGNOSIS — Z8673 Personal history of transient ischemic attack (TIA), and cerebral infarction without residual deficits: Secondary | ICD-10-CM | POA: Insufficient documentation

## 2020-12-25 DIAGNOSIS — Z955 Presence of coronary angioplasty implant and graft: Secondary | ICD-10-CM | POA: Diagnosis not present

## 2020-12-25 DIAGNOSIS — Z952 Presence of prosthetic heart valve: Secondary | ICD-10-CM | POA: Insufficient documentation

## 2020-12-25 DIAGNOSIS — Z87891 Personal history of nicotine dependence: Secondary | ICD-10-CM | POA: Insufficient documentation

## 2020-12-25 DIAGNOSIS — Z79899 Other long term (current) drug therapy: Secondary | ICD-10-CM | POA: Diagnosis not present

## 2020-12-25 DIAGNOSIS — I739 Peripheral vascular disease, unspecified: Secondary | ICD-10-CM | POA: Diagnosis not present

## 2020-12-25 DIAGNOSIS — Z7984 Long term (current) use of oral hypoglycemic drugs: Secondary | ICD-10-CM | POA: Insufficient documentation

## 2020-12-25 DIAGNOSIS — E118 Type 2 diabetes mellitus with unspecified complications: Secondary | ICD-10-CM | POA: Insufficient documentation

## 2020-12-25 DIAGNOSIS — I350 Nonrheumatic aortic (valve) stenosis: Secondary | ICD-10-CM | POA: Diagnosis not present

## 2020-12-25 DIAGNOSIS — I2510 Atherosclerotic heart disease of native coronary artery without angina pectoris: Secondary | ICD-10-CM | POA: Insufficient documentation

## 2020-12-25 DIAGNOSIS — I252 Old myocardial infarction: Secondary | ICD-10-CM | POA: Insufficient documentation

## 2020-12-25 DIAGNOSIS — Z7901 Long term (current) use of anticoagulants: Secondary | ICD-10-CM | POA: Insufficient documentation

## 2020-12-25 DIAGNOSIS — I5022 Chronic systolic (congestive) heart failure: Secondary | ICD-10-CM | POA: Diagnosis not present

## 2020-12-25 HISTORY — DX: Malignant neoplasm of unspecified part of unspecified bronchus or lung: C34.90

## 2020-12-25 LAB — COMPREHENSIVE METABOLIC PANEL
ALT: 25 U/L (ref 0–44)
AST: 27 U/L (ref 15–41)
Albumin: 4 g/dL (ref 3.5–5.0)
Alkaline Phosphatase: 73 U/L (ref 38–126)
Anion gap: 10 (ref 5–15)
BUN: 26 mg/dL — ABNORMAL HIGH (ref 8–23)
CO2: 29 mmol/L (ref 22–32)
Calcium: 9.3 mg/dL (ref 8.9–10.3)
Chloride: 98 mmol/L (ref 98–111)
Creatinine, Ser: 1.25 mg/dL — ABNORMAL HIGH (ref 0.61–1.24)
GFR, Estimated: >60 mL/min (ref >60)
Glucose, Bld: 249 mg/dL — ABNORMAL HIGH (ref 70–99)
Potassium: 4.7 mmol/L (ref 3.5–5.1)
Sodium: 137 mmol/L (ref 135–145)
Total Bilirubin: 0.9 mg/dL (ref 0.3–1.2)
Total Protein: 7 g/dL (ref 6.5–8.1)

## 2020-12-25 LAB — CBC
HCT: 53.9 % — ABNORMAL HIGH (ref 39.0–52.0)
Hemoglobin: 17.3 g/dL — ABNORMAL HIGH (ref 13.0–17.0)
MCH: 31.1 pg (ref 26.0–34.0)
MCHC: 32.1 g/dL (ref 30.0–36.0)
MCV: 96.8 fL (ref 80.0–100.0)
Platelets: 222 10*3/uL (ref 150–400)
RBC: 5.57 MIL/uL (ref 4.22–5.81)
RDW: 14.7 % (ref 11.5–15.5)
WBC: 7.3 10*3/uL (ref 4.0–10.5)
nRBC: 0 % (ref 0.0–0.2)

## 2020-12-25 LAB — TSH: TSH: 2.112 u[IU]/mL (ref 0.350–4.500)

## 2020-12-25 LAB — PROTIME-INR
INR: 3.1 — ABNORMAL HIGH (ref 0.8–1.2)
Prothrombin Time: 30.9 seconds — ABNORMAL HIGH (ref 11.4–15.2)

## 2020-12-25 MED ORDER — AMIODARONE HCL 200 MG PO TABS: 200.0000 mg | ORAL_TABLET | Freq: Every day | ORAL | 3 refills | Status: DC

## 2020-12-25 MED ORDER — SPIRONOLACTONE 25 MG PO TABS: 25.0000 mg | ORAL_TABLET | Freq: Every day | ORAL | 3 refills | Status: DC

## 2020-12-25 NOTE — Patient Instructions (Addendum)
START Spironolactone 25mg  (1 tablet) Daily  INCREASE Amiodarone 200mg  (1 tablet) Daily  STOP Plavix  Labs done today, your results will be available in MyChart, we will contact you for abnormal readings.  Your physician has requested that you have a carotid duplex. This test is an ultrasound of the carotid arteries in your neck. It looks at blood flow through these arteries that supply the brain with blood. Allow one hour for this exam. There are no restrictions or special instructions. ONCE WE APPROVE WITH INSURANCE WE WILL CONTACT YOU TO SCHEDULE  Your physician has requested that you have an ankle brachial index (ABI). During this test an ultrasound and blood pressure cuff are used to evaluate the arteries that supply the arms and legs with blood. Allow thirty minutes for this exam. There are no restrictions or special instructions.ONCE WE APPROVE WITH INSURANCE WE WILL CONTACT YOU TO SCHEDULE  Your physician recommends that you schedule repeat labs in 2 months  Your physician recommends that you schedule a follow-up appointment in: 3 weeks with the APP clinic  If you have any questions or concerns before your next appointment please send [place] a message through [place] or call our office at [phone].    TO LEAVE A MESSAGE FOR THE NURSE SELECT OPTION 2, PLEASE LEAVE A MESSAGE INCLUDING: . YOUR NAME . DATE OF BIRTH . CALL BACK NUMBER . REASON FOR CALL**this is important as we prioritize the call backs  YOU WILL RECEIVE A CALL BACK THE SAME DAY AS LONG AS YOU CALL BEFORE 4:00 PM     You are scheduled for a Cardioversion on Wednesday January 26th with Dr. Aundra Dubin.  Please arrive at the [facility] (Main Entrance A) at [facility]: [address] [place], [place] [address] at 8 am  DIET: Nothing to eat or drink after midnight except a sip of water with medications (see medication instructions below)  Medication Instructions: Hold LASIX, SPIRONOLACTONE, AND GLYBURIDE 1/26th  AM  Continue your anticoagulant: Warfarin You will need to continue your anticoagulant after your procedure until you are told by your, Provider that it is safe to stop   COVID TEST: 1/24  [place], [place] [address]  You must have a responsible person to drive you home and stay in the waiting area during your procedure. Failure to do so could result in cancellation.  Bring your insurance cards.  *Special Note: Every effort is made to have your procedure done on time. Occasionally there are emergencies that occur at the hospital that may cause delays. Please be patient if a delay does occur.

## 2020-12-25 NOTE — Patient Instructions (Addendum)
Description   Spoke to pt instructed him to continue taking Warfarin 1 tablet daily except for 1.5 tablets on Sundays, Tuesdays and Thursdays. Eat an extra serving of greens today.  Recheck INR in 1 week on 1/25. Coumadin Clinic for any changes in medications or upcoming procedures. [phone]

## 2020-12-25 NOTE — Progress Notes (Signed)
PCP: Shon Baton, MD HF Cardiology: Dr. Velora Heckler a 75 y.o.malewith a history of CAD s/p CABG, mechanical AVR on coumadin, extensive PAD, DM, HTN, HLD, ITP, TIA, and recurrent GI bleed. Note, requires lovenox bridging off heparin.  Dr. Gwenlyn Found follows him for extensive PAD. He has had an aortobifem bypass in 2016 with follow up iliac stenting and femoral endarterectomy. In 2017, he had subsequent ostial and mid right SFA intervention. Hx of left renal artery stenting in 2016 with repeat intervention on left for 75% in-stent restenosis, with progression of disease on the right renal artery showing 60% stenosis on duplex 07/12/19. Echo 09/17/19 with normal EF of 60-65% and normal AVR function; chronic diastolic heart failure.   Admitted in 10/20 with chest pain. He underwent heart cath 09/18/19 that showed 99% left main stenosis and patent LIMA to occluded LAD and a patent sequential vein to the PDA and PLA of an occluded dominant right. His left main stenosis jeopardized the moderately large ramus and high first marginal and nondominant Cx which has 90% mid AV groove Cx stenosis. Aggressive medical therapy was recommended initially.  He was readmitted in 10/20 with CHF and chest pain. Hospital course complicated by A flutter/low output heart failure.  S/P successful orbital atherectomy, PTCA, and stenting of protected left main with a 4.0x15 mm resolute onyx DES 11/3. LIMA-LAD patent. SVG-PDA patent. ECHO this admission showed EF 30-35%, mechanical AoV ok. TEE 11/9 w/ improved EF, 40-45%. Placed on milrinone initially with low co-ox and later weaned off. He was cardioverted from atrial flutter back to NSR.   2/21 peripheral arterial dopplers showed 75-99% stenosis right SFA.  In 3/21, he had stenting to right SFA.    Echo in 6/21 showed EF up to 55-60%.    In 12/21, he had an atrial fibrillation ablation.   He was admitted in 1/22 with shortness of breath and was found to be  in atrial fibrillation with RVR. He was hypertensive.  Troponin was negative. He was diuresed and discharged, cardiology was not notified.  Echo during this admission showed EF 60-65%, normal RV size and systolic function, stable mechanical aortic valve.   During the 1/22 admission, CT chest showed a lung nodule.  Followup PET-CT showed probable early lung cancer.   He returns today for CHF followup.  He is still in atypical atrial flutter today.  Lasix was increased at recent admission, he says that breathing is back to normal.  Weight is up 5 lbs compared to prior appt.  No chest pain.  No dyspnea walking on flat ground.  No orthopnea/PND.  He does not feel palpitations.  INR has been therapeutic for > 1 month .      ECG (personally reviewed): Atypical atrial flutter, rightward axis.   Labs (11/20): K 4.3, creatinine 1.3, hgb 12.5 Labs (12/20): LDL 56, HDL 80, TGs 179 Labs (1/21): K 4.8, creatinine 1.45 Labs (4/21): K 5.2, creatinine 1.34 Labs (6/21): LDL 44 Labs (1/22): K 4, creatinine 1.3  PMH  1. CAD:  S/P CABG (3/04) with LIMA-LAD, seq SVG-PDA/PLV.  - NSTEMI 10/20 with cath showing patent LIMA-LAD and SVG-PDA/PLV but 99% LM stenosis, occluded LAD, moderate ramus and high OM1 jeopardized, also 90% mid AV groove LCx stenosis.  Occluded RCA.  Patient ultimately had orbital atherectomy and stenting of left main with a 4.0 x 15 mm Resolute Onyx DES.  2. Mechanical aortic valve: TEE 11/20 showed stable-appearing mechanical valve, mean gradient 9 mmHg.  3. PAD:  Aortobifemoral bypass in 2016 with follow up iliac stenting and femoral endarterectomy. In 2017, he had subsequent ostial and mid right SFA intervention. - Angiography 10/20 with 80% in-stent restenosis in proximal right SFA.  - Peripheral arterial dopplers (2/21) with 75-99% R SFA stenosis in prior stented area.  - 3/21 Right SFA stent (Dr. Gwenlyn Found).  4. Type 2 diabetes.  5. HTN - Renal artery dopplers (8/21): No significant stenosis.   6. Hyperlipidemia 7. H/o ITP  8. TIA  9. Chronic Systolic HF: Ischemic cardiomyopathy.  - Echo (10/20): EF 30-35%, mechanical AoV ok.  - TEE (11/20): w/ improved EF, 40-45%. Mechanical aortic valve with mean gradient 9 mmHg, normal RV.  - Echo (6/21): EF 55-60%, mild LVH, mechanical AoV with mean gradient 9 mmHg.  - Echo (1/22): EF 60-65%, normal RV size and systolic function, stable mechanical aortic valve.  10. Atrial fibrillation: Paroxysmal.  - Atrial fibrillation ablation 12/21.  11. Renal artery stenosis: Hx of left renal artery stenting in 2016 with repeat intervention on left for 75% in-stent restenosis, with progression of disease on the right renal artery showing 60% stenosis on duplex 07/12/19. 12. Carotid stenosis: 4/21 carotid dopplers with right subclavian stenosis, 40-59% RICA stenosis.  13. Suspected lung cancer: PET scan 1/22 suggestive of early lung cancer.   ROS: All systems negative except as listed in HPI, PMH and Problem List.  Social History   Socioeconomic History  . Marital status: Divorced    Spouse name: Not on file  . Number of children: 3  . Years of education: Not on file  . Highest education level: Not on file  Occupational History  . Occupation: Retired  Tobacco Use  . Smoking status: Former Smoker    Years: 30.00    Types: Cigarettes, Cigars    Quit date: 12/15/1993    Years since quitting: 27.0  . Smokeless tobacco: Never Used  . Tobacco comment: occasional cigar  Vaping Use  . Vaping Use: Never used  Substance and Sexual Activity  . Alcohol use: Yes    Alcohol/week: 16.0 standard drinks    Types: 1 Cans of beer, 1 Shots of liquor, 14 Standard drinks or equivalent per week    Comment: drinks 2 martini's a night (2 shots in each)  . Drug use: No  . Sexual activity: Yes  Other Topics Concern  . Not on file  Social History Narrative   Tries to remain active.  Frequent golfer but claudication limits this.   Social Determinants of Health    Financial Resource Strain: Not on file  Food Insecurity: Not on file  Transportation Needs: Not on file  Physical Activity: Not on file  Stress: Not on file  Social Connections: Not on file  Intimate Partner Violence: Not on file   Family History  Problem Relation Age of Onset  . Coronary artery disease Mother        bypass surgery - deceased  . Heart disease Father        murmur, valve replacement - deceased  . Breast cancer Sister   . Diabetes Other        grandmother  . Diabetes Paternal Grandmother   . Diabetes Paternal Aunt   . Colon cancer Neg Hx   . Colon polyps Neg Hx   . Esophageal cancer Neg Hx   . Rectal cancer Neg Hx   . Stomach cancer Neg Hx     Current Outpatient Medications  Medication Sig Dispense Refill  . atorvastatin (LIPITOR)  80 MG tablet Take 80 mg by mouth daily.    . Evolocumab 140 MG/ML SOAJ Inject 140 mg into the skin every 14 (fourteen) days. 1st and 15th of each month    . furosemide (LASIX) 20 MG tablet Take 2 tablets (40 mg total) by mouth daily. 90 tablet 3  . glyBURIDE (DIABETA) 2.5 MG tablet Take 5 mg by mouth 2 (two) times daily with a meal.    . INVOKANA 100 MG TABS tablet Take 100 mg by mouth daily.    . nitroGLYCERIN (NITROSTAT) 0.4 MG SL tablet Place 1 tablet (0.4 mg total) under the tongue every 5 (five) minutes x 3 doses as needed for chest pain. 25 tablet 12  . sacubitril-valsartan (ENTRESTO) 24-26 MG Take 1 tablet by mouth 2 (two) times daily. 60 tablet 0  . spironolactone (ALDACTONE) 25 MG tablet Take 1 tablet (25 mg total) by mouth daily. 30 tablet 3  . warfarin (COUMADIN) 5 MG tablet TAKE AS DIRECTED BY COUMADIN CLINIC (Patient taking differently: Take 5-7.5 mg by mouth See admin instructions. Take 7.5 mg on Sun, Tues, and Thurs, Take 5 mg on Mon, Wed, Fri, and Sat) 110 tablet 0  . amiodarone (PACERONE) 200 MG tablet Take 1 tablet (200 mg total) by mouth daily. 30 tablet 3  . amoxicillin (AMOXIL) 500 MG capsule Take 2,000 mg by  mouth once.     No current facility-administered medications for this encounter.    Vitals:   12/25/20 0930  BP: 140/70  Pulse: 100  SpO2: 97%  Weight: 80.3 kg (177 lb)   Wt Readings from Last 3 Encounters:  12/25/20 80.3 kg (177 lb)  12/09/20 76.7 kg (169 lb 1.6 oz)  11/20/20 (P) 78 kg (172 lb)    PHYSICAL EXAM: General: NAD Neck: JVP 8 cm, no thyromegaly or thyroid nodule.  Lungs: Clear to auscultation bilaterally with normal respiratory effort. CV: Nondisplaced PMI.  Heart irregular S1/S2 with mechanical S2, no S3/S4, 1/6 SEM RUSB.  No peripheral edema.  No carotid bruit.  Normal pedal pulses.  Abdomen: Soft, nontender, no hepatosplenomegaly, no distention.  Skin: Intact without lesions or rashes.  Neurologic: Alert and oriented x 3.  Psych: Normal affect. Extremities: No clubbing or cyanosis.  HEENT: Normal.    ASSESSMENT & PLAN: 1. Chronic Systolic CHF: Ischemic cardiomyopathy. Echo 10/06/19 showed EF 30-35% and wall motion abnormalities, prior echo earlier in 10/20 showed EF 50-55%. Cath 10/20 showed jeopardized ramus and LCx territory (99% distal left main, occluded LAD, patent LIMA-LAD). Concerned that ischemia in this territory triggered CHF, worsening of LV function.  Now s/p DES to left main, TEE in 11/20 after intervention showed EF higher at 40-45%.  Echo in 1/22 showed EF 60-65% with normal RV.  NYHA class II symptoms, not significantly volume overloaded. He is in atypical atrial flutter today.  - Continue Entresto 24/26 bid.  BMET today.   - Continue Lasix 40 mg daily.    - Restart spironolactone 25 mg daily with BMET 10 days.   2. CAD: Complicated disease, coronary angiography 10/20 showed patent SVG-RCA territory and patent LIMA-LAD. The proximal LAD was occluded and there was 99% distal left main stenosis. This left the ramus and LCx in jeopardy. He was not a good candidate for protected PCI =>cannot place Impella with mechanical aortic valve and peripheral  vascular disease likely precludes a femoral IABP. It was decided to try to manage him medically. However, he returned with progressive chest pain episodes and NSTEMI, hs-TnI up  to 935. S/p successful orbital atherectomy and stenting of the left main with a 4.0x15 mm Resolute Onyx DES 10/10/19.No chest pain.   - He has been on Plavix for > 1 year post-PCI, will stop Plavix today.  I will not start him on ASA 81 as he has had GI bleeding x 2 when ASA was added to warfarin in the past.  - Continue warfarin for mechanical valve.    - Continue atorvastatin 80 daily + Repatha.  Good LDL in 6/21.   3. Mechanical aortic valve: Stable on 1/22 echo. Crisp mechanical sounds on exam - I will not use ASA given GI bleeding on combination of ASA and warfarin.  - Continue warfarin.   4. NSVT: Noted 10/20 admission, on amiodarone.  5. H/o GI bleeding: Continue PPI for GI protection. 6. PAD: Patient with aorto-bifemoral bypass, h/o iliac stenting, h/o femoral endarterectomy, PCI to right SFA in 2017.  Angiography in 10/20 with 80% ISR in right SFA.  He had PCI to right SFA in 3/21, no claudication now.  - Repeat ABIs in 4/22.  7. Carotid/subclavian stenosis: Repeat carotid dopplers in 4/22.  8. DM2: Will discuss addition of SGLT2 inhibitor at next appt.   10. Atrial fibrillation/atypical flutter:  TEE-guided DCCV in 11/20, atrial fibrillation ablation in 12/21.  Admitted with CHF exacerbation in 1/22 in the setting of recurrence of atrial fibrillation.  He remains in atypical flutter today.  - Increase amiodarone to 200 mg daily. Check LFTs, TSH today.  He will need a regular eye exam.   - Continue coumadin, check INR today to make sure therapeutic.  - I will arrange for DCCV next week.  Think we can avoid TEE as INR has been therapeutic > 1 month.  I will have him get INR check on the am of DCCV.  Discussed risks/benefits with patient and he agrees to procedure.  - Hopefully, he will remain in NSR after this, and  as we get some time out from ablation, can get him off amiodarone.   Followup 3 wks NP/PA.   Loralie Champagne  12/25/2020

## 2020-12-25 NOTE — H&P (View-Only) (Signed)
PCP: Shon Baton, MD HF Cardiology: Dr. Velora Heckler a 75 y.o.malewith a history of CAD s/p CABG, mechanical AVR on coumadin, extensive PAD, DM, HTN, HLD, ITP, TIA, and recurrent GI bleed. Note, requires lovenox bridging off heparin.  Dr. Gwenlyn Found follows him for extensive PAD. He has had an aortobifem bypass in 2016 with follow up iliac stenting and femoral endarterectomy. In 2017, he had subsequent ostial and mid right SFA intervention. Hx of left renal artery stenting in 2016 with repeat intervention on left for 75% in-stent restenosis, with progression of disease on the right renal artery showing 60% stenosis on duplex 07/12/19. Echo 09/17/19 with normal EF of 60-65% and normal AVR function; chronic diastolic heart failure.   Admitted in 10/20 with chest pain. He underwent heart cath 09/18/19 that showed 99% left main stenosis and patent LIMA to occluded LAD and a patent sequential vein to the PDA and PLA of an occluded dominant right. His left main stenosis jeopardized the moderately large ramus and high first marginal and nondominant Cx which has 90% mid AV groove Cx stenosis. Aggressive medical therapy was recommended initially.  He was readmitted in 10/20 with CHF and chest pain. Hospital course complicated by A flutter/low output heart failure.  S/P successful orbital atherectomy, PTCA, and stenting of protected left main with a 4.0x15 mm resolute onyx DES 11/3. LIMA-LAD patent. SVG-PDA patent. ECHO this admission showed EF 30-35%, mechanical AoV ok. TEE 11/9 w/ improved EF, 40-45%. Placed on milrinone initially with low co-ox and later weaned off. He was cardioverted from atrial flutter back to NSR.   2/21 peripheral arterial dopplers showed 75-99% stenosis right SFA.  In 3/21, he had stenting to right SFA.    Echo in 6/21 showed EF up to 55-60%.    In 12/21, he had an atrial fibrillation ablation.   He was admitted in 1/22 with shortness of breath and was found to be  in atrial fibrillation with RVR. He was hypertensive.  Troponin was negative. He was diuresed and discharged, cardiology was not notified.  Echo during this admission showed EF 60-65%, normal RV size and systolic function, stable mechanical aortic valve.   During the 1/22 admission, CT chest showed a lung nodule.  Followup PET-CT showed probable early lung cancer.   He returns today for CHF followup.  He is still in atypical atrial flutter today.  Lasix was increased at recent admission, he says that breathing is back to normal.  Weight is up 5 lbs compared to prior appt.  No chest pain.  No dyspnea walking on flat ground.  No orthopnea/PND.  He does not feel palpitations.  INR has been therapeutic for > 1 month .      ECG (personally reviewed): Atypical atrial flutter, rightward axis.   Labs (11/20): K 4.3, creatinine 1.3, hgb 12.5 Labs (12/20): LDL 56, HDL 80, TGs 179 Labs (1/21): K 4.8, creatinine 1.45 Labs (4/21): K 5.2, creatinine 1.34 Labs (6/21): LDL 44 Labs (1/22): K 4, creatinine 1.3  PMH  1. CAD:  S/P CABG (3/04) with LIMA-LAD, seq SVG-PDA/PLV.  - NSTEMI 10/20 with cath showing patent LIMA-LAD and SVG-PDA/PLV but 99% LM stenosis, occluded LAD, moderate ramus and high OM1 jeopardized, also 90% mid AV groove LCx stenosis.  Occluded RCA.  Patient ultimately had orbital atherectomy and stenting of left main with a 4.0 x 15 mm Resolute Onyx DES.  2. Mechanical aortic valve: TEE 11/20 showed stable-appearing mechanical valve, mean gradient 9 mmHg.  3. PAD:  Aortobifemoral bypass in 2016 with follow up iliac stenting and femoral endarterectomy. In 2017, he had subsequent ostial and mid right SFA intervention. - Angiography 10/20 with 80% in-stent restenosis in proximal right SFA.  - Peripheral arterial dopplers (2/21) with 75-99% R SFA stenosis in prior stented area.  - 3/21 Right SFA stent (Dr. Gwenlyn Found).  4. Type 2 diabetes.  5. HTN - Renal artery dopplers (8/21): No significant stenosis.   6. Hyperlipidemia 7. H/o ITP  8. TIA  9. Chronic Systolic HF: Ischemic cardiomyopathy.  - Echo (10/20): EF 30-35%, mechanical AoV ok.  - TEE (11/20): w/ improved EF, 40-45%. Mechanical aortic valve with mean gradient 9 mmHg, normal RV.  - Echo (6/21): EF 55-60%, mild LVH, mechanical AoV with mean gradient 9 mmHg.  - Echo (1/22): EF 60-65%, normal RV size and systolic function, stable mechanical aortic valve.  10. Atrial fibrillation: Paroxysmal.  - Atrial fibrillation ablation 12/21.  11. Renal artery stenosis: Hx of left renal artery stenting in 2016 with repeat intervention on left for 75% in-stent restenosis, with progression of disease on the right renal artery showing 60% stenosis on duplex 07/12/19. 12. Carotid stenosis: 4/21 carotid dopplers with right subclavian stenosis, 40-59% RICA stenosis.  13. Suspected lung cancer: PET scan 1/22 suggestive of early lung cancer.   ROS: All systems negative except as listed in HPI, PMH and Problem List.  Social History   Socioeconomic History  . Marital status: Divorced    Spouse name: Not on file  . Number of children: 3  . Years of education: Not on file  . Highest education level: Not on file  Occupational History  . Occupation: Retired  Tobacco Use  . Smoking status: Former Smoker    Years: 30.00    Types: Cigarettes, Cigars    Quit date: 12/15/1993    Years since quitting: 27.0  . Smokeless tobacco: Never Used  . Tobacco comment: occasional cigar  Vaping Use  . Vaping Use: Never used  Substance and Sexual Activity  . Alcohol use: Yes    Alcohol/week: 16.0 standard drinks    Types: 1 Cans of beer, 1 Shots of liquor, 14 Standard drinks or equivalent per week    Comment: drinks 2 martini's a night (2 shots in each)  . Drug use: No  . Sexual activity: Yes  Other Topics Concern  . Not on file  Social History Narrative   Tries to remain active.  Frequent golfer but claudication limits this.   Social Determinants of Health    Financial Resource Strain: Not on file  Food Insecurity: Not on file  Transportation Needs: Not on file  Physical Activity: Not on file  Stress: Not on file  Social Connections: Not on file  Intimate Partner Violence: Not on file   Family History  Problem Relation Age of Onset  . Coronary artery disease Mother        bypass surgery - deceased  . Heart disease Father        murmur, valve replacement - deceased  . Breast cancer Sister   . Diabetes Other        grandmother  . Diabetes Paternal Grandmother   . Diabetes Paternal Aunt   . Colon cancer Neg Hx   . Colon polyps Neg Hx   . Esophageal cancer Neg Hx   . Rectal cancer Neg Hx   . Stomach cancer Neg Hx     Current Outpatient Medications  Medication Sig Dispense Refill  . atorvastatin (LIPITOR)  80 MG tablet Take 80 mg by mouth daily.    . Evolocumab 140 MG/ML SOAJ Inject 140 mg into the skin every 14 (fourteen) days. 1st and 15th of each month    . furosemide (LASIX) 20 MG tablet Take 2 tablets (40 mg total) by mouth daily. 90 tablet 3  . glyBURIDE (DIABETA) 2.5 MG tablet Take 5 mg by mouth 2 (two) times daily with a meal.    . INVOKANA 100 MG TABS tablet Take 100 mg by mouth daily.    . nitroGLYCERIN (NITROSTAT) 0.4 MG SL tablet Place 1 tablet (0.4 mg total) under the tongue every 5 (five) minutes x 3 doses as needed for chest pain. 25 tablet 12  . sacubitril-valsartan (ENTRESTO) 24-26 MG Take 1 tablet by mouth 2 (two) times daily. 60 tablet 0  . spironolactone (ALDACTONE) 25 MG tablet Take 1 tablet (25 mg total) by mouth daily. 30 tablet 3  . warfarin (COUMADIN) 5 MG tablet TAKE AS DIRECTED BY COUMADIN CLINIC (Patient taking differently: Take 5-7.5 mg by mouth See admin instructions. Take 7.5 mg on Sun, Tues, and Thurs, Take 5 mg on Mon, Wed, Fri, and Sat) 110 tablet 0  . amiodarone (PACERONE) 200 MG tablet Take 1 tablet (200 mg total) by mouth daily. 30 tablet 3  . amoxicillin (AMOXIL) 500 MG capsule Take 2,000 mg by  mouth once.     No current facility-administered medications for this encounter.    Vitals:   12/25/20 0930  BP: 140/70  Pulse: 100  SpO2: 97%  Weight: 80.3 kg (177 lb)   Wt Readings from Last 3 Encounters:  12/25/20 80.3 kg (177 lb)  12/09/20 76.7 kg (169 lb 1.6 oz)  11/20/20 (P) 78 kg (172 lb)    PHYSICAL EXAM: General: NAD Neck: JVP 8 cm, no thyromegaly or thyroid nodule.  Lungs: Clear to auscultation bilaterally with normal respiratory effort. CV: Nondisplaced PMI.  Heart irregular S1/S2 with mechanical S2, no S3/S4, 1/6 SEM RUSB.  No peripheral edema.  No carotid bruit.  Normal pedal pulses.  Abdomen: Soft, nontender, no hepatosplenomegaly, no distention.  Skin: Intact without lesions or rashes.  Neurologic: Alert and oriented x 3.  Psych: Normal affect. Extremities: No clubbing or cyanosis.  HEENT: Normal.    ASSESSMENT & PLAN: 1. Chronic Systolic CHF: Ischemic cardiomyopathy. Echo 10/06/19 showed EF 30-35% and wall motion abnormalities, prior echo earlier in 10/20 showed EF 50-55%. Cath 10/20 showed jeopardized ramus and LCx territory (99% distal left main, occluded LAD, patent LIMA-LAD). Concerned that ischemia in this territory triggered CHF, worsening of LV function.  Now s/p DES to left main, TEE in 11/20 after intervention showed EF higher at 40-45%.  Echo in 1/22 showed EF 60-65% with normal RV.  NYHA class II symptoms, not significantly volume overloaded. He is in atypical atrial flutter today.  - Continue Entresto 24/26 bid.  BMET today.   - Continue Lasix 40 mg daily.    - Restart spironolactone 25 mg daily with BMET 10 days.   2. CAD: Complicated disease, coronary angiography 10/20 showed patent SVG-RCA territory and patent LIMA-LAD. The proximal LAD was occluded and there was 99% distal left main stenosis. This left the ramus and LCx in jeopardy. He was not a good candidate for protected PCI =>cannot place Impella with mechanical aortic valve and peripheral  vascular disease likely precludes a femoral IABP. It was decided to try to manage him medically. However, he returned with progressive chest pain episodes and NSTEMI, hs-TnI up  to 935. S/p successful orbital atherectomy and stenting of the left main with a 4.0x15 mm Resolute Onyx DES 10/10/19.No chest pain.   - He has been on Plavix for > 1 year post-PCI, will stop Plavix today.  I will not start him on ASA 81 as he has had GI bleeding x 2 when ASA was added to warfarin in the past.  - Continue warfarin for mechanical valve.    - Continue atorvastatin 80 daily + Repatha.  Good LDL in 6/21.   3. Mechanical aortic valve: Stable on 1/22 echo. Crisp mechanical sounds on exam - I will not use ASA given GI bleeding on combination of ASA and warfarin.  - Continue warfarin.   4. NSVT: Noted 10/20 admission, on amiodarone.  5. H/o GI bleeding: Continue PPI for GI protection. 6. PAD: Patient with aorto-bifemoral bypass, h/o iliac stenting, h/o femoral endarterectomy, PCI to right SFA in 2017.  Angiography in 10/20 with 80% ISR in right SFA.  He had PCI to right SFA in 3/21, no claudication now.  - Repeat ABIs in 4/22.  7. Carotid/subclavian stenosis: Repeat carotid dopplers in 4/22.  8. DM2: Will discuss addition of SGLT2 inhibitor at next appt.   10. Atrial fibrillation/atypical flutter:  TEE-guided DCCV in 11/20, atrial fibrillation ablation in 12/21.  Admitted with CHF exacerbation in 1/22 in the setting of recurrence of atrial fibrillation.  He remains in atypical flutter today.  - Increase amiodarone to 200 mg daily. Check LFTs, TSH today.  He will need a regular eye exam.   - Continue coumadin, check INR today to make sure therapeutic.  - I will arrange for DCCV next week.  Think we can avoid TEE as INR has been therapeutic > 1 month.  I will have him get INR check on the am of DCCV.  Discussed risks/benefits with patient and he agrees to procedure.  - Hopefully, he will remain in NSR after this, and  as we get some time out from ablation, can get him off amiodarone.   Followup 3 wks NP/PA.   Loralie Champagne  12/25/2020

## 2020-12-26 ENCOUNTER — Other Ambulatory Visit: Payer: Self-pay | Admitting: *Deleted

## 2020-12-26 NOTE — Progress Notes (Signed)
The proposed treatment discussed in cancer conference is for discussion purpose only and is not a binding recommendation. The patient was not physically examined nor present for their treatment options. Therefore, final treatment plans cannot be decided.  ?

## 2020-12-27 ENCOUNTER — Other Ambulatory Visit: Payer: Self-pay | Admitting: Cardiovascular Disease

## 2020-12-30 ENCOUNTER — Other Ambulatory Visit (HOSPITAL_COMMUNITY)
Admission: RE | Admit: 2020-12-30 | Discharge: 2020-12-30 | Disposition: A | Discharge status: Home / Self Care | Payer: Medicare Other | Source: Ambulatory Visit | Attending: Cardiology | Admitting: Cardiology

## 2020-12-30 DIAGNOSIS — Z01812 Encounter for preprocedural laboratory examination: Secondary | ICD-10-CM | POA: Diagnosis not present

## 2020-12-30 DIAGNOSIS — Z20822 Contact with and (suspected) exposure to covid-19: Secondary | ICD-10-CM | POA: Insufficient documentation

## 2020-12-30 LAB — SARS CORONAVIRUS 2 (TAT 6-24 HRS): SARS Coronavirus 2: NEGATIVE

## 2020-12-30 NOTE — Progress Notes (Deleted)
[address]       [place] [address]             [phone]                    [name] [name] [place] Medical Record #[id number] Date of Birth: 1946-10-23  Referring: Shon Baton, MD Primary Care: Shon Baton, MD Primary Cardiologist: Jenkins Rouge, MD  Chief Complaint:   No chief complaint on file.   History of Present Illness:    [name] [name] 75 y.o. male is seen in the office  today for       Current Activity/ Functional Status:  {functional status:19517}   Zubrod Score: At the time of surgery this patient's most appropriate activity status/level should be described as: []     0    Normal activity, no symptoms []     1    Restricted in physical strenuous activity but ambulatory, able to do out light work []     2    Ambulatory and capable of self care, unable to do work activities, up and about               >50 % of waking hours                              []     3    Only limited self care, in bed greater than 50% of waking hours []     4    Completely disabled, no self care, confined to bed or chair []     5    Moribund   Past Medical History:  Diagnosis Date  . Adenomatous colon polyp 09/1997  . AKI (acute kidney injury) ([place]) 12/08/2020  . Anemia   . Aortic stenosis    s/p st. jude mechanical AVR - Chronic Coumadin  . Blood transfusion    "related to ITP"  . CHF (congestive heart failure) ([place])   . COPD (chronic obstructive pulmonary disease) ([place])   . Coronary artery disease    s/p cabg x 3 11/2003: lima-lad, seq vg to rpda and rpl  . Diverticulitis of colon   . Dysrhythmia   . Heart murmur   . Hyperlipidemia   . Hypertension   . ITP (idiopathic thrombocytopenic purpura)   . Lung cancer ([place])    last week  . Peripheral arterial disease ([place])    a. history of aortobifemoral bypass grafting by Dr. Sherren Mocha early b. LE angiography 04/22/2015 patent aortobifem graft, DES to R SFA  . Peripheral vascular disease ([place])     s/p Left external Iliac Artery stenting and subsequent left femoral endarterectomy 02/2011- post op course complicated by wound infxn req I&D 03/2011  . Renal artery stenosis, native, bilateral ([facility])    a. bilateral renal artery stenosis by recent duplex ultrasound b. L renal artery stent 02/2015, R renal artery patent on angiogram  . Stroke ([place])   . TIA (transient ischemic attack) ~ 2013  . Type II diabetes mellitus ([place])     Past Surgical History:  Procedure Laterality Date  . ABDOMINAL AORTAGRAM N/A 12/16/2011   Procedure: ABDOMINAL Maxcine Ham;  Surgeon: Sherren Mocha, MD;  Location: [facility] CATH LAB;  Service: Cardiovascular;  Laterality: N/A;  . ABDOMINAL AORTOGRAM W/LOWER EXTREMITY Right 02/29/2020   Procedure: ABDOMINAL AORTOGRAM W/LOWER EXTREMITY;  Surgeon: Lorretta Harp, MD;  Location: [place] CV LAB;  Service:  Cardiovascular;  Laterality: Right;  . ANGIOPLASTY / STENTING ILIAC     Left external Iliac Artery  . AORTA - BILATERAL FEMORAL ARTERY BYPASS GRAFT  01/18/2012   Procedure: AORTA BIFEMORAL BYPASS GRAFT;  Surgeon: Curt Jews, MD;  Location: [place];  Service: Vascular;  Laterality: N/A;  . AORTIC VALVE REPLACEMENT  ~ 2004  . ATRIAL FIBRILLATION ABLATION N/A 11/20/2020   Procedure: ATRIAL FIBRILLATION ABLATION;  Surgeon: Constance Haw, MD;  Location: [place] CV LAB;  Service: Cardiovascular;  Laterality: N/A;  . CARDIAC CATHETERIZATION  11/2003   /pt report 10/01/2016  . CARDIAC VALVE REPLACEMENT  11/2003   aortic  . CARDIOVERSION N/A 10/16/2019   Procedure: CARDIOVERSION;  Surgeon: Larey Dresser, MD;  Location: [facility] ENDOSCOPY;  Service: Cardiovascular;  Laterality: N/A;  . CATARACT EXTRACTION W/ INTRAOCULAR LENS  IMPLANT, BILATERAL Bilateral   . COLONOSCOPY    . COLONOSCOPY WITH PROPOFOL N/A 09/03/2019   Procedure: COLONOSCOPY WITH PROPOFOL;  Surgeon: Doran Stabler, MD;  Location: [facility] ENDOSCOPY;  Service: Gastroenterology;  Laterality: N/A;  . CORONARY ARTERY  BYPASS GRAFT  11/2003   Archie Endo 04/21/2011  . CORONARY ATHERECTOMY N/A 10/10/2019   Procedure: CORONARY ATHERECTOMY;  Surgeon: Sherren Mocha, MD;  Location: [place] CV LAB;  Service: Cardiovascular;  Laterality: N/A;  . CORONARY STENT INTERVENTION N/A 10/10/2019   Procedure: CORONARY STENT INTERVENTION;  Surgeon: Sherren Mocha, MD;  Location: [place] CV LAB;  Service: Cardiovascular;  Laterality: N/A;  . CORONARY/GRAFT ANGIOGRAPHY N/A 09/18/2019   Procedure: CORONARY/GRAFT ANGIOGRAPHY;  Surgeon: Lorretta Harp, MD;  Location: [place] CV LAB;  Service: Cardiovascular;  Laterality: N/A;  . ESOPHAGOGASTRODUODENOSCOPY (EGD) WITH PROPOFOL N/A 02/27/2019   Procedure: ESOPHAGOGASTRODUODENOSCOPY (EGD) WITH PROPOFOL;  Surgeon: Doran Stabler, MD;  Location: [place];  Service: Endoscopy;  Laterality: N/A;  . HEMOSTASIS CLIP PLACEMENT  09/03/2019   Procedure: HEMOSTASIS CLIP PLACEMENT;  Surgeon: Doran Stabler, MD;  Location: [facility] ENDOSCOPY;  Service: Gastroenterology;;  . LOWER EXTREMITY ANGIOGRAM N/A 02/21/2015   Procedure: LOWER EXTREMITY ANGIOGRAM;  Surgeon: Lorretta Harp, MD;  Location: [facility] CATH LAB;  Service: Cardiovascular;  Laterality: N/A;  . PERIPHERAL VASCULAR CATHETERIZATION N/A 04/22/2015   Procedure: Lower Extremity Angiography;  Surgeon: Lorretta Harp, MD;  Location: [place] CV LAB;  Service: Cardiovascular;  Laterality: N/A;  . PERIPHERAL VASCULAR CATHETERIZATION N/A 08/24/2016   Procedure: Lower Extremity Angiography;  Surgeon: Lorretta Harp, MD;  Location: [place] CV LAB;  Service: Cardiovascular;  Laterality: N/A;  . PERIPHERAL VASCULAR CATHETERIZATION Right 10/01/2016   Procedure: Peripheral Vascular Intervention - STENT;  Surgeon: Lorretta Harp, MD;  Location: [place] CV LAB;  Service: Cardiovascular;  Laterality: Right;  Prox and MID SFA   . PERIPHERAL VASCULAR INTERVENTION  02/29/2020   Procedure: PERIPHERAL VASCULAR INTERVENTION;  Surgeon:  Lorretta Harp, MD;  Location: [place] CV LAB;  Service: Cardiovascular;;  Right SFA  . POLYPECTOMY    . RENAL ANGIOGRAM N/A 02/21/2015   Procedure: RENAL ANGIOGRAM;  Surgeon: Lorretta Harp, MD;  Location: [facility] CATH LAB;  Service: Cardiovascular;  Laterality: Bilateral; 6 mm x 12 mm long Herculink balloon expandable stent to the left renal artery  . RENAL ARTERY STENT Left 04/22/2015   dr berry  . SPLENECTOMY  02/2003   Archie Endo 04/21/2011  . TEE WITHOUT CARDIOVERSION N/A 10/16/2019   Procedure: TRANSESOPHAGEAL ECHOCARDIOGRAM (TEE);  Surgeon: Larey Dresser, MD;  Location: [facility] ENDOSCOPY;  Service: Cardiovascular;  Laterality: N/A;  . TEMPORARY PACEMAKER N/A 10/10/2019   Procedure: TEMPORARY PACEMAKER;  Surgeon: Sherren Mocha, MD;  Location: [place] CV LAB;  Service: Cardiovascular;  Laterality: N/A;  . TONSILLECTOMY  ~ 1952    Family History  Problem Relation Age of Onset  . Coronary artery disease Mother        bypass surgery - deceased  . Heart disease Father        murmur, valve replacement - deceased  . Breast cancer Sister   . Diabetes Other        grandmother  . Diabetes Paternal Grandmother   . Diabetes Paternal Aunt   . Colon cancer Neg Hx   . Colon polyps Neg Hx   . Esophageal cancer Neg Hx   . Rectal cancer Neg Hx   . Stomach cancer Neg Hx      Social History   Tobacco Use  Smoking Status Former Smoker  . Years: 30.00  . Types: Cigarettes, Cigars  . Quit date: 12/15/1993  . Years since quitting: 27.0  Smokeless Tobacco Never Used  Tobacco Comment   occasional cigar    Social History   Substance and Sexual Activity  Alcohol Use Yes  . Alcohol/week: 16.0 standard drinks  . Types: 1 Cans of beer, 1 Shots of liquor, 14 Standard drinks or equivalent per week   Comment: drinks 2 martini's a night (2 shots in each)     No Known Allergies  Current Outpatient Medications  Medication Sig Dispense Refill  . amiodarone (PACERONE) 200 MG tablet Take 1  tablet (200 mg total) by mouth daily. 30 tablet 3  . amoxicillin (AMOXIL) 500 MG capsule Take 2,000 mg by mouth once.    [name] atorvastatin (LIPITOR) 80 MG tablet Take 80 mg by mouth daily.    . Evolocumab 140 MG/ML SOAJ Inject 140 mg into the skin every 14 (fourteen) days. 1st and 15th of each month    . furosemide (LASIX) 20 MG tablet Take 2 tablets (40 mg total) by mouth daily. 90 tablet 3  . glyBURIDE (DIABETA) 2.5 MG tablet Take 5 mg by mouth 2 (two) times daily with a meal.    . INVOKANA 100 MG TABS tablet Take 100 mg by mouth daily.    . nitroGLYCERIN (NITROSTAT) 0.4 MG SL tablet Place 1 tablet (0.4 mg total) under the tongue every 5 (five) minutes x 3 doses as needed for chest pain. 25 tablet 12  . sacubitril-valsartan (ENTRESTO) 24-26 MG Take 1 tablet by mouth 2 (two) times daily. 60 tablet 0  . spironolactone (ALDACTONE) 25 MG tablet Take 1 tablet (25 mg total) by mouth daily. 30 tablet 3  . warfarin (COUMADIN) 5 MG tablet Take 1 to 1 and 1/2 tablets by mouth daily as prescribed by the coumadin clinic. 120 tablet 0   No current facility-administered medications for this visit.    {Ros - complete:30496}   Review of Systems:     Cardiac Review of Systems: [Y] = yes  or   [ N ] = no   Chest Pain [    ]  Resting SOB [   ] Exertional SOB  [  ]  Orthopnea [  ]   Pedal Edema [   ]    Palpitations [  ] Syncope  [  ]   Presyncope [   ]   General Review of Systems: [Y] = yes [  ]=no Constitional: recent weight change [  ];  Wt loss  over the last 3 months [   ] anorexia [  ]; fatigue [  ]; nausea [  ]; night sweats [  ]; fever [  ]; or chills [  ];           Eye : blurred vision [  ]; diplopia [   ]; vision changes [  ];  Amaurosis fugax[  ]; Resp: cough [  ];  wheezing[  ];  hemoptysis[  ]; shortness of breath[  ]; paroxysmal nocturnal dyspnea[  ]; dyspnea on exertion[  ]; or orthopnea[  ];  GI:  gallstones[  ], vomiting[  ];  dysphagia[  ]; melena[  ];  hematochezia [  ]; heartburn[  ];   Hx  of  Colonoscopy[  ]; GU: kidney stones [  ]; hematuria[  ];   dysuria [  ];  nocturia[  ];  history of     obstruction [  ]; urinary frequency [  ]             Skin: rash, swelling[  ];, hair loss[  ];  peripheral edema[  ];  or itching[  ]; Musculosketetal: myalgias[  ];  joint swelling[  ];  joint erythema[  ];  joint pain[  ];  back pain[  ];  Heme/Lymph: bruising[  ];  bleeding[  ];  anemia[  ];  Neuro: TIA[  ];  headaches[  ];  stroke[  ];  vertigo[  ];  seizures[  ];   paresthesias[  ];  difficulty walking[  ];  Psych:depression[  ]; anxiety[  ];  Endocrine: diabetes[  ];  thyroid dysfunction[  ];  Immunizations: Flu up to date [  ]; Pneumococcal up to date [  ]; COVID-58 vacination completed  [   ]  Other:     PHYSICAL EXAMINATION: There were no vitals taken for this visit. {physical exam:21449}  Diagnostic Studies & Laboratory data:     Recent Radiology Findings:   CT Angio Chest PE W and/or Wo Contrast  Result Date: 12/08/2020 CLINICAL DATA:  Concern for pulmonary embolism. Dyspnea. Chest pressure. EXAM: CT ANGIOGRAPHY CHEST WITH CONTRAST TECHNIQUE: Multidetector CT imaging of the chest was performed using the standard protocol during bolus administration of intravenous contrast. Multiplanar CT image reconstructions and MIPs were obtained to evaluate the vascular anatomy. CONTRAST:  33mL OMNIPAQUE IOHEXOL 350 MG/ML SOLN COMPARISON:  Chest radiograph from earlier today. 09/16/2019 chest CT angiogram. 11/13/2020 coronary CT. FINDINGS: Cardiovascular: The study is high quality for the evaluation of pulmonary embolism. There are no filling defects in the central, lobar, segmental or subsegmental pulmonary artery branches to suggest acute pulmonary embolism. Atherosclerotic nonaneurysmal thoracic aorta. Top-normal caliber main pulmonary artery (3.2 cm diameter). Top-normal heart size. No significant pericardial fluid/thickening. Aortic valve prosthesis in place. Left main and three-vessel  coronary atherosclerosis status post CABG. Mediastinum/Nodes: No discrete thyroid nodules. Unremarkable esophagus. No pathologically enlarged axillary, mediastinal or hilar lymph nodes. Lungs/Pleura: No pneumothorax. No pleural effusion. Moderate centrilobular emphysema with diffuse bronchial wall thickening. Saber sheath trachea. No acute consolidative airspace disease or lung masses. Irregular 1.5 cm lingular solid pulmonary nodule (series 6/image 90), increased from 0.9 cm on 09/16/2019 CT. Peripheral right middle lobe 0.6 cm solid pulmonary nodule (series 6/image 109) is stable and considered benign. No additional significant pulmonary nodules. Upper abdomen: Contrast reflux into the IVC and hepatic veins. Colonic diverticulosis. Musculoskeletal: No aggressive appearing focal osseous lesions. Intact sternotomy wires. Mild thoracic spondylosis. Review of the MIP images confirms  the above findings. IMPRESSION: 1. No pulmonary embolism. 2. Irregular 1.5 cm lingular solid pulmonary nodule, increased since 09/16/2019 CT, cannot exclude primary bronchogenic carcinoma. PET-CT suggested for further evaluation. 3. Moderate centrilobular emphysema with diffuse bronchial wall thickening and saber sheath trachea, suggesting COPD. 4. Contrast reflux into the IVC and hepatic veins, suggesting elevated central venous pressures/right heart failure. 5. Aortic Atherosclerosis (ICD10-I70.0) and Emphysema (ICD10-J43.9). Electronically Signed   By: Ilona Sorrel M.D.   On: 12/08/2020 04:38   NM PET Image Initial (PI) Skull Base To Thigh  Result Date: 12/13/2020 CLINICAL DATA:  Initial treatment strategy for lingular nodule. EXAM: NUCLEAR MEDICINE PET SKULL BASE TO THIGH TECHNIQUE: 8.3 mCi F-18 FDG was injected intravenously. Full-ring PET imaging was performed from the skull base to thigh after the radiotracer. CT data was obtained and used for attenuation correction and anatomic localization. Fasting blood glucose: 160 mg/dl  COMPARISON:  CTA chest dated 12/08/2020 FINDINGS: Mediastinal blood pool activity: SUV max 2.5 Liver activity: SUV max NA NECK: No hypermetabolic cervical lymphadenopathy. Incidental CT findings: none CHEST: 11 x 16 mm irregular nodule in the lingula (series 3/image 41), previously 7 mm in Oct 2020, max SUV 2.3. While this demonstrates only low level hypermetabolism, the growth from prior studies raises concern for early primary bronchogenic neoplasm. No hypermetabolic thoracic lymphadenopathy. Small mediastinal nodes, measuring up to 8 mm short axis in the AP window. Incidental CT findings: Prosthetic aortic valve. Atherosclerotic calcifications of the aortic arch. Three vessel coronary atherosclerosis. Postsurgical changes related to prior CABG. ABDOMEN/PELVIS: No abnormal hypermetabolism in the liver, spleen, pancreas, or adrenal glands. No hypermetabolic abdominopelvic lymphadenopathy. Incidental CT findings: Aorta bi-iliac graft. Atherosclerotic calcifications. Prostatomegaly. Trabeculated bladder, suggesting chronic bladder outlet obstruction. Colonic diverticulosis, without evidence of diverticulitis. SKELETON: No focal hypermetabolic activity to suggest skeletal metastasis. Incidental CT findings: Mild degenerative changes of the lumbar spine. IMPRESSION: 11 x 16 mm lingular nodule, concerning for early primary bronchogenic neoplasm. No findings suspicious for metastatic disease. Electronically Signed   By: Julian Hy M.D.   On: 12/13/2020 10:41   DG Chest Portable 1 View  Result Date: 12/08/2020 CLINICAL DATA:  Dyspnea, chest pressure EXAM: PORTABLE CHEST 1 VIEW COMPARISON:  03/05/2020 FINDINGS: Advanced vascular calcifications noted within the lung apices bilaterally. Lungs are otherwise clear. No pneumothorax or pleural effusion. Coronary artery bypass grafting has been performed. Cardiac size within normal limits. Pulmonary vascularity is normal. No acute bone abnormality. IMPRESSION: No active  disease. Electronically Signed   By: Fidela Salisbury MD   On: 12/08/2020 00:23   ECHOCARDIOGRAM COMPLETE  Result Date: 12/08/2020    ECHOCARDIOGRAM REPORT   Patient Name:   KARDER GOODIN Date of Exam: 12/08/2020 Medical Rec #:  [id number]             Height:       68.0 in Accession #:    [id number]            Weight:       174.0 lb Date of Birth:  [birth year]             BSA:          1.926 m Patient Age:    56 years              BP:           144/37 mmHg Patient Gender: M                     HR:  67 bpm. Exam Location:  Inpatient Procedure: 2D Echo, Color Doppler and Cardiac Doppler Indications:    CHF-Acute Diastolic U98.11  History:        Patient has prior history of Echocardiogram examinations, most                 recent 05/30/2020. CHF, CAD, Stroke, TIA and COPD; Risk                 Factors:Diabetes, Hypertension and Dyslipidemia.                 Aortic Valve: mechanical valve is present in the aortic                 position.  Sonographer:    Bernadene Person RDCS Referring Phys: [id number] [place]  1. Normal LV systolic function; s/p AVR with normal mean gradient (8 mmHg) and no AI.  2. Left ventricular ejection fraction, by estimation, is 60 to 65%. The left ventricle has normal function. The left ventricle has no regional wall motion abnormalities. There is moderate left ventricular hypertrophy. Left ventricular diastolic parameters are indeterminate. Elevated left atrial pressure.  3. Right ventricular systolic function is normal. The right ventricular size is normal.  4. Left atrial size was moderately dilated.  5. The mitral valve is normal in structure. No evidence of mitral valve regurgitation. No evidence of mitral stenosis.  6. The aortic valve has been repaired/replaced. Aortic valve regurgitation is not visualized. No aortic stenosis is present. There is a mechanical valve present in the aortic position.  7. The inferior vena cava is normal in size with greater  than 50% respiratory variability, suggesting right atrial pressure of 3 mmHg. FINDINGS  Left Ventricle: Left ventricular ejection fraction, by estimation, is 60 to 65%. The left ventricle has normal function. The left ventricle has no regional wall motion abnormalities. The left ventricular internal cavity size was normal in size. There is  moderate left ventricular hypertrophy. Left ventricular diastolic parameters are indeterminate. Elevated left atrial pressure. Right Ventricle: The right ventricular size is normal. Right ventricular systolic function is normal. Left Atrium: Left atrial size was moderately dilated. Right Atrium: Right atrial size was normal in size. Pericardium: There is no evidence of pericardial effusion. Mitral Valve: The mitral valve is normal in structure. Mild mitral annular calcification. No evidence of mitral valve regurgitation. No evidence of mitral valve stenosis. Tricuspid Valve: The tricuspid valve is normal in structure. Tricuspid valve regurgitation is trivial. No evidence of tricuspid stenosis. Aortic Valve: The aortic valve has been repaired/replaced. Aortic valve regurgitation is not visualized. No aortic stenosis is present. There is a mechanical valve present in the aortic position. Pulmonic Valve: The pulmonic valve was normal in structure. Pulmonic valve regurgitation is trivial. No evidence of pulmonic stenosis. Aorta: The aortic root is normal in size and structure. Venous: The inferior vena cava is normal in size with greater than 50% respiratory variability, suggesting right atrial pressure of 3 mmHg.  Additional Comments: Normal LV systolic function; s/p AVR with normal mean gradient (8 mmHg) and no AI.  LEFT VENTRICLE PLAX 2D LVIDd:         4.00 cm  Diastology LVIDs:         2.80 cm  LV e' medial:    5.70 cm/s LV PW:         1.60 cm  LV E/e' medial:  19.6 LV IVS:        1.20 cm  LV e' lateral:   5.48 cm/s LVOT diam:     2.00 cm  LV E/e' lateral: 20.4 LV SV:         62  LV SV Index:   32 LVOT Area:     3.14 cm  RIGHT VENTRICLE RV S prime:     8.27 cm/s TAPSE (M-mode): 1.5 cm LEFT ATRIUM             Index       RIGHT ATRIUM           Index LA diam:        4.60 cm 2.39 cm/m  RA Area:     15.10 cm LA Vol (A2C):   69.1 ml 35.87 ml/m RA Volume:   33.20 ml  17.24 ml/m LA Vol (A4C):   79.8 ml 41.43 ml/m LA Biplane Vol: 75.4 ml 39.14 ml/m  AORTIC VALVE LVOT Vmax:   92.00 cm/s LVOT Vmean:  66.300 cm/s LVOT VTI:    0.198 m  AORTA Ao Root diam: 3.30 cm Ao Asc diam:  3.00 cm MITRAL VALVE MV Area (PHT): 2.17 cm     SHUNTS MV Decel Time: 349 msec     Systemic VTI:  0.20 m MV E velocity: 112.00 cm/s  Systemic Diam: 2.00 cm MV A velocity: 54.80 cm/s MV E/A ratio:  2.04 Kirk Ruths MD Electronically signed by Kirk Ruths MD Signature Date/Time: 12/08/2020/10:54:31 AM    Final      I have independently reviewed the above radiology studies  and reviewed the findings with the patient.   Recent Lab Findings: Lab Results  Component Value Date   WBC 7.3 12/25/2020   HGB 17.3 (H) 12/25/2020   HCT 53.9 (H) 12/25/2020   PLT 222 12/25/2020   GLUCOSE 249 (H) 12/25/2020   CHOL 154 05/30/2020   TRIG 47 05/30/2020   HDL 101 05/30/2020   LDLCALC 44 05/30/2020   ALT 25 12/25/2020   AST 27 12/25/2020   NA 137 12/25/2020   K 4.7 12/25/2020   CL 98 12/25/2020   CREATININE 1.25 (H) 12/25/2020   BUN 26 (H) 12/25/2020   CO2 29 12/25/2020   TSH 2.112 12/25/2020   INR 3.1 (H) 12/25/2020   HGBA1C 7.1 (H) 12/08/2020      Assessment / Plan:        I  spent {CHL ONC TIME VISIT - HDQQI:[id number]} with  the patient face to face and greater then 50% of the time was spent in counseling and coordination of care.    Grace Isaac MD      [address] [place] [address] Office [phone]     12/30/2020 9:48 AM

## 2020-12-31 ENCOUNTER — Ambulatory Visit (INDEPENDENT_AMBULATORY_CARE_PROVIDER_SITE_OTHER): Payer: Medicare Other | Admitting: *Deleted

## 2020-12-31 ENCOUNTER — Other Ambulatory Visit: Payer: Self-pay

## 2020-12-31 DIAGNOSIS — Z5181 Encounter for therapeutic drug level monitoring: Secondary | ICD-10-CM

## 2020-12-31 DIAGNOSIS — I359 Nonrheumatic aortic valve disorder, unspecified: Secondary | ICD-10-CM

## 2020-12-31 LAB — POCT INR: INR: 3.3 — AB (ref 2.0–3.0)

## 2020-12-31 NOTE — Patient Instructions (Signed)
Description    Start taking warfarin 1 tablet daily except for 1.5 tablets on Sunday and Thursday. Eat a salad today. Amio increased from 100mg  daily to 200mg  daily on 12/25/2020. Recheck INR in 1 week post cardioversion. Coumadin Clinic for any changes in medications or upcoming procedures. [phone]

## 2021-01-01 ENCOUNTER — Other Ambulatory Visit: Payer: Self-pay

## 2021-01-01 ENCOUNTER — Ambulatory Visit (HOSPITAL_COMMUNITY): Payer: Medicare Other | Admitting: Certified Registered"

## 2021-01-01 ENCOUNTER — Encounter (HOSPITAL_COMMUNITY)
Admission: RE | Disposition: A | Discharge status: Home / Self Care | Payer: Self-pay | Source: Home / Self Care | Attending: Cardiology

## 2021-01-01 ENCOUNTER — Encounter (HOSPITAL_COMMUNITY): Payer: Self-pay | Admitting: Cardiology

## 2021-01-01 ENCOUNTER — Encounter: Payer: Medicare Other | Admitting: Cardiothoracic Surgery

## 2021-01-01 ENCOUNTER — Ambulatory Visit (HOSPITAL_COMMUNITY)
Admission: RE | Admit: 2021-01-01 | Discharge: 2021-01-01 | Disposition: A | Discharge status: Home / Self Care | Payer: Medicare Other | Attending: Cardiology | Admitting: Cardiology

## 2021-01-01 DIAGNOSIS — I480 Paroxysmal atrial fibrillation: Secondary | ICD-10-CM | POA: Diagnosis not present

## 2021-01-01 DIAGNOSIS — Z79899 Other long term (current) drug therapy: Secondary | ICD-10-CM | POA: Diagnosis not present

## 2021-01-01 DIAGNOSIS — I5022 Chronic systolic (congestive) heart failure: Secondary | ICD-10-CM | POA: Insufficient documentation

## 2021-01-01 DIAGNOSIS — I25110 Atherosclerotic heart disease of native coronary artery with unstable angina pectoris: Secondary | ICD-10-CM | POA: Diagnosis not present

## 2021-01-01 DIAGNOSIS — Z87891 Personal history of nicotine dependence: Secondary | ICD-10-CM | POA: Insufficient documentation

## 2021-01-01 DIAGNOSIS — E785 Hyperlipidemia, unspecified: Secondary | ICD-10-CM | POA: Diagnosis not present

## 2021-01-01 DIAGNOSIS — Z7901 Long term (current) use of anticoagulants: Secondary | ICD-10-CM | POA: Insufficient documentation

## 2021-01-01 DIAGNOSIS — I255 Ischemic cardiomyopathy: Secondary | ICD-10-CM | POA: Insufficient documentation

## 2021-01-01 DIAGNOSIS — E1151 Type 2 diabetes mellitus with diabetic peripheral angiopathy without gangrene: Secondary | ICD-10-CM | POA: Insufficient documentation

## 2021-01-01 DIAGNOSIS — Z7984 Long term (current) use of oral hypoglycemic drugs: Secondary | ICD-10-CM | POA: Diagnosis not present

## 2021-01-01 DIAGNOSIS — Z952 Presence of prosthetic heart valve: Secondary | ICD-10-CM | POA: Diagnosis not present

## 2021-01-01 DIAGNOSIS — I2510 Atherosclerotic heart disease of native coronary artery without angina pectoris: Secondary | ICD-10-CM | POA: Diagnosis not present

## 2021-01-01 DIAGNOSIS — I110 Hypertensive heart disease with heart failure: Secondary | ICD-10-CM | POA: Insufficient documentation

## 2021-01-01 DIAGNOSIS — I5023 Acute on chronic systolic (congestive) heart failure: Secondary | ICD-10-CM | POA: Diagnosis not present

## 2021-01-01 DIAGNOSIS — I4891 Unspecified atrial fibrillation: Secondary | ICD-10-CM | POA: Diagnosis not present

## 2021-01-01 HISTORY — PX: CARDIOVERSION: SHX1299

## 2021-01-01 LAB — GLUCOSE, CAPILLARY: Glucose-Capillary: 151 mg/dL — ABNORMAL HIGH (ref 70–99)

## 2021-01-01 SURGERY — CARDIOVERSION
Anesthesia: General

## 2021-01-01 MED ORDER — LIDOCAINE HCL (CARDIAC) PF 100 MG/5ML IV SOSY
PREFILLED_SYRINGE | INTRAVENOUS | Status: DC | PRN
  Administered 2021-01-01: 60 mg via INTRATRACHEAL

## 2021-01-01 MED ORDER — SODIUM CHLORIDE 0.9 % IV SOLN: INTRAVENOUS | Status: DC | PRN

## 2021-01-01 MED ORDER — PROPOFOL 10 MG/ML IV BOLUS
INTRAVENOUS | Status: DC | PRN
  Administered 2021-01-01: 50 mg via INTRAVENOUS

## 2021-01-01 NOTE — Interval H&P Note (Signed)
History and Physical Interval Note:  01/01/2021 9:20 AM  [name] [name]  has presented today for surgery, with the diagnosis of AFIB.  The various methods of treatment have been discussed with the patient and family. After consideration of risks, benefits and other options for treatment, the patient has consented to  Procedure(s): CARDIOVERSION (N/A) as a surgical intervention.  The patient's history has been reviewed, patient examined, no change in status, stable for surgery.  I have reviewed the patient's chart and labs.  Questions were answered to the patient's satisfaction.     [name] [employer]

## 2021-01-01 NOTE — Anesthesia Preprocedure Evaluation (Signed)
Anesthesia Evaluation  Patient identified by MRN, date of birth, ID band Patient awake    Reviewed: Allergy & Precautions, NPO status , Patient's Chart, lab work & pertinent test results  Airway Mallampati: II  TM Distance: >3 FB Neck ROM: Full    Dental  (+) Teeth Intact, Dental Advisory Given   Pulmonary COPD, former smoker,    Pulmonary exam normal breath sounds clear to auscultation       Cardiovascular hypertension, + CAD, + Past MI, + CABG, + Peripheral Vascular Disease and +CHF  + dysrhythmias Atrial Fibrillation + Valvular Problems/Murmurs (s/p AVR)  Rhythm:Irregular Rate:Abnormal     Neuro/Psych TIACVA, No Residual Symptoms    GI/Hepatic negative GI ROS, Neg liver ROS,   Endo/Other  diabetes, Type 2, Oral Hypoglycemic Agents  Renal/GU Renal hypertensionRenal disease     Musculoskeletal negative musculoskeletal ROS (+)   Abdominal   Peds  Hematology  (+) Blood dyscrasia (ITP), ,   Anesthesia Other Findings Day of surgery medications reviewed with the patient.  Reproductive/Obstetrics                             Anesthesia Physical Anesthesia Plan  ASA: III  Anesthesia Plan: General   Post-op Pain Management:    Induction: Intravenous  PONV Risk Score and Plan: 2 and Propofol infusion and Treatment may vary due to age or medical condition  Airway Management Planned: Mask  Additional Equipment:   Intra-op Plan:   Post-operative Plan:   Informed Consent: I have reviewed the patients History and Physical, chart, labs and discussed the procedure including the risks, benefits and alternatives for the proposed anesthesia with the patient or authorized representative who has indicated his/her understanding and acceptance.     Dental advisory given  Plan Discussed with: CRNA  Anesthesia Plan Comments:         Anesthesia Quick Evaluation

## 2021-01-01 NOTE — Anesthesia Postprocedure Evaluation (Signed)
Anesthesia Post Note  Patient: [name] [name]  Procedure(s) Performed: CARDIOVERSION (N/A )     Patient location during evaluation: Endoscopy Anesthesia Type: General Level of consciousness: awake and alert Pain management: pain level controlled Vital Signs Assessment: post-procedure vital signs reviewed and stable Respiratory status: spontaneous breathing, nonlabored ventilation and respiratory function stable Cardiovascular status: blood pressure returned to baseline and stable Postop Assessment: no apparent nausea or vomiting Anesthetic complications: no   No complications documented.  Last Vitals:  Vitals:   01/01/21 0940 01/01/21 0950  BP: (!) 170/43 (!) 162/41  Pulse: 74 72  Resp: (!) 21 17  Temp:    SpO2: 95% 94%    Last Pain:  Vitals:   01/01/21 0950  TempSrc:   PainSc: 0-No pain                 Catalina Gravel

## 2021-01-01 NOTE — Discharge Instructions (Signed)
Electrical Cardioversion Electrical cardioversion is the delivery of a jolt of electricity to restore a normal rhythm to the heart. A rhythm that is too fast or is not regular keeps the heart from pumping well. In this procedure, sticky patches or metal paddles are placed on the chest to deliver electricity to the heart from a device. This procedure may be done in an emergency if:  There is low or no blood pressure as a result of the heart rhythm.  Normal rhythm must be restored as fast as possible to protect the brain and heart from further damage.  It may save a life. This may also be a scheduled procedure for irregular or fast heart rhythms that are not immediately life-threatening. Tell a health care provider about:  Any allergies you have.  All medicines you are taking, including vitamins, herbs, eye drops, creams, and over-the-counter medicines.  Any problems you or family members have had with anesthetic medicines.  Any blood disorders you have.  Any surgeries you have had.  Any medical conditions you have.  Whether you are pregnant or may be pregnant. What are the risks? Generally, this is a safe procedure. However, problems may occur, including:  Allergic reactions to medicines.  A blood clot that breaks free and travels to other parts of your body.  The possible return of an abnormal heart rhythm within hours or days after the procedure.  Your heart stopping (cardiac arrest). This is rare. What happens before the procedure? Medicines  Your health care provider may have you start taking: ? Blood-thinning medicines (anticoagulants) so your blood does not clot as easily. ? Medicines to help stabilize your heart rate and rhythm.  Ask your health care provider about: ? Changing or stopping your regular medicines. This is especially important if you are taking diabetes medicines or blood thinners. ? Taking medicines such as aspirin and ibuprofen. These medicines can  thin your blood. Do not take these medicines unless your health care provider tells you to take them. ? Taking over-the-counter medicines, vitamins, herbs, and supplements. General instructions  Follow instructions from your health care provider about eating or drinking restrictions.  Plan to have someone take you home from the hospital or clinic.  If you will be going home right after the procedure, plan to have someone with you for 24 hours.  Ask your health care provider what steps will be taken to help prevent infection. These may include washing your skin with a germ-killing soap. What happens during the procedure?  An IV will be inserted into one of your veins.  Sticky patches (electrodes) or metal paddles may be placed on your chest.  You will be given a medicine to help you relax (sedative).  An electrical shock will be delivered. The procedure may vary among health care providers and hospitals.   What can I expect after the procedure?  Your blood pressure, heart rate, breathing rate, and blood oxygen level will be monitored until you leave the hospital or clinic.  Your heart rhythm will be watched to make sure it does not change.  You may have some redness on the skin where the shocks were given. Follow these instructions at home:  Do not drive for 24 hours if you were given a sedative during your procedure.  Take over-the-counter and prescription medicines only as told by your health care provider.  Ask your health care provider how to check your pulse. Check it often.  Rest for 48 hours after the procedure   or as told by your health care provider.  Avoid or limit your caffeine use as told by your health care provider.  Keep all follow-up visits as told by your health care provider. This is important. Contact a health care provider if:  You feel like your heart is beating too quickly or your pulse is not regular.  You have a serious muscle cramp that does not go  away. Get help right away if:  You have discomfort in your chest.  You are dizzy or you feel faint.  You have trouble breathing or you are short of breath.  Your speech is slurred.  You have trouble moving an arm or leg on one side of your body.  Your fingers or toes turn cold or blue. Summary  Electrical cardioversion is the delivery of a jolt of electricity to restore a normal rhythm to the heart.  This procedure may be done right away in an emergency or may be a scheduled procedure if the condition is not an emergency.  Generally, this is a safe procedure.  After the procedure, check your pulse often as told by your health care provider. This information is not intended to replace advice given to you by your health care provider. Make sure you discuss any questions you have with your health care provider. Document Revised: 06/26/2019 Document Reviewed: 06/26/2019 Elsevier Patient Education  2021 [employer]  

## 2021-01-01 NOTE — Procedures (Signed)
Electrical Cardioversion Procedure Note [name] [name] [id number] 09-24-1946  Procedure: Electrical Cardioversion Indications:  Atrial Fibrillation  Procedure Details Consent: Risks of procedure as well as the alternatives and risks of each were explained to the (patient/caregiver).  Consent for procedure obtained. Time Out: Verified patient identification, verified procedure, site/side was marked, verified correct patient position, special equipment/implants available, medications/allergies/relevent history reviewed, required imaging and test results available.  Performed  Patient placed on cardiac monitor, pulse oximetry, supplemental oxygen as necessary.  Sedation given: Propofol per anesthesiology Pacer pads placed anterior and posterior chest.  Cardioverted 1 time(s).  Cardioverted at [place].  Evaluation Findings: Post procedure EKG shows: NSR Complications: None Patient did tolerate procedure well.   Loralie Champagne 01/01/2021, 9:26 AM

## 2021-01-01 NOTE — Transfer of Care (Signed)
Immediate Anesthesia Transfer of Care Note  Patient: [name] [name]  Procedure(s) Performed: CARDIOVERSION (N/A )  Patient Location: Endoscopy Unit  Anesthesia Type:General  Level of Consciousness: awake, alert  and sedated  Airway & Oxygen Therapy: Patient connected to face mask oxygen  Post-op Assessment: Post -op Vital signs reviewed and stable  Post vital signs: stable  Last Vitals:  Vitals Value Taken Time  BP    Temp    Pulse    Resp    SpO2      Last Pain:  Vitals:   01/01/21 0820  TempSrc: Oral  PainSc: 0-No pain         Complications: No complications documented.

## 2021-01-02 ENCOUNTER — Encounter (HOSPITAL_COMMUNITY): Payer: Self-pay | Admitting: Cardiology

## 2021-01-02 ENCOUNTER — Other Ambulatory Visit (HOSPITAL_COMMUNITY): Payer: Self-pay | Admitting: Cardiology

## 2021-01-03 ENCOUNTER — Other Ambulatory Visit: Payer: Self-pay

## 2021-01-03 ENCOUNTER — Ambulatory Visit (HOSPITAL_COMMUNITY)
Admission: RE | Admit: 2021-01-03 | Discharge: 2021-01-03 | Disposition: A | Discharge status: Home / Self Care | Payer: Medicare Other | Source: Ambulatory Visit | Attending: Internal Medicine | Admitting: Internal Medicine

## 2021-01-03 ENCOUNTER — Other Ambulatory Visit (HOSPITAL_COMMUNITY): Payer: Self-pay | Admitting: Cardiology

## 2021-01-03 DIAGNOSIS — I739 Peripheral vascular disease, unspecified: Secondary | ICD-10-CM

## 2021-01-03 DIAGNOSIS — I350 Nonrheumatic aortic (valve) stenosis: Secondary | ICD-10-CM

## 2021-01-06 ENCOUNTER — Ambulatory Visit (INDEPENDENT_AMBULATORY_CARE_PROVIDER_SITE_OTHER): Payer: Medicare Other | Admitting: Pulmonary Disease

## 2021-01-06 ENCOUNTER — Telehealth (HOSPITAL_COMMUNITY): Payer: Self-pay

## 2021-01-06 ENCOUNTER — Telehealth: Payer: Self-pay

## 2021-01-06 ENCOUNTER — Other Ambulatory Visit: Payer: Self-pay

## 2021-01-06 ENCOUNTER — Encounter: Payer: Self-pay | Admitting: Pulmonary Disease

## 2021-01-06 ENCOUNTER — Telehealth: Payer: Self-pay | Admitting: Pulmonary Disease

## 2021-01-06 VITALS — BP 110/72 | HR 89 | Temp 98.0°F | Ht 68.0 in | Wt 176.0 lb

## 2021-01-06 DIAGNOSIS — I4891 Unspecified atrial fibrillation: Secondary | ICD-10-CM

## 2021-01-06 DIAGNOSIS — R911 Solitary pulmonary nodule: Secondary | ICD-10-CM

## 2021-01-06 DIAGNOSIS — I499 Cardiac arrhythmia, unspecified: Secondary | ICD-10-CM | POA: Diagnosis not present

## 2021-01-06 DIAGNOSIS — J432 Centrilobular emphysema: Secondary | ICD-10-CM

## 2021-01-06 DIAGNOSIS — Z87891 Personal history of nicotine dependence: Secondary | ICD-10-CM | POA: Diagnosis not present

## 2021-01-06 DIAGNOSIS — R942 Abnormal results of pulmonary function studies: Secondary | ICD-10-CM | POA: Diagnosis not present

## 2021-01-06 NOTE — Telephone Encounter (Signed)
He just got cardioverted 1/26.  Would it be possible to wait 4 wks after that prior to holding warfarin for bronchoscopy/biopsy?

## 2021-01-06 NOTE — Telephone Encounter (Signed)
[name] [name],   Thanks for speaking with him.  Thanks  Lance Muss, DO [facility] 01/06/2021 6:20 PM

## 2021-01-06 NOTE — Telephone Encounter (Signed)
The following has been scheduled for the pt & pt has been made aware:  COVID TEST - 2/8 @ 2:30 ENB - 2/11 @ 7:30 @ [facility] has been requested to be sent to [facility].   Pt has a question about the coumadin - Pt aware that he will need to stop it 3 days prior.  Pt would like to know if this has been communicated to the coumadin clinic.

## 2021-01-06 NOTE — Telephone Encounter (Signed)
[name] [name],  We are planning a bronchoscopy for possible lung cancer for 01/17/2021. Can you help arrange the appropriate lovenox bridging while on coumadin? Thanks [employer]

## 2021-01-06 NOTE — Telephone Encounter (Signed)
I spoke with Dr. Aundra Dubin.  We will need to schedule for Feb 22nd 2022 for bronchoscopy.  Due to need for [facility] following cardioversion.  This is my next closest procedure date to 4 weeks out. (its one day off)   Thanks  [place], DO [facility] 01/06/2021 1:43 PM

## 2021-01-06 NOTE — Telephone Encounter (Signed)
Yes, we could. He likely has a stage 1 lung cancer based on imaging. He is interested in moving forward as soon as we can but I didn't think about the recent cardioversion.    [facility] 01/06/2021 1:37 PM

## 2021-01-06 NOTE — Progress Notes (Signed)
Synopsis: Referred in January 2021 for PET avid lung nodule by Shon Baton, MD  Subjective:   PATIENT ID: [name] [name] GENDER: male DOB: 05/04/1946, MRN: [id number]  Chief Complaint  Patient presents with  . Consult    Lung nodule-- Ct angio and PET scans done    This is a 75 year old gentleman past medical history of aortic stenosis, mechanical Saint Jude AVR on Coumadin, heart failure, COPD, history of coronary artery disease status post CABG, ITP, peripheral arterial disease, TIA, type 2 diabetes.Patient seen today in the office to discuss CT scan results.  Initial CT was completed at the beginning of January which revealed a 1.5 cm lingular nodule.  Patient had a PET scan completed which revealed an SUV max of 2.311 x 16 mm lingular nodule concerning for primary bronchogenic carcinoma.  Patient has a significant cardiac history.  Last week was taken to endoscopy for cardioversion.  By Dr. Aundra Dubin.  Currently anticoagulated with Coumadin.  History of AVR.  He has no complaints today from a respiratory standpoint.  Former smoker.  Denies hemoptysis weight loss fatigue.   Past Medical History:  Diagnosis Date  . Adenomatous colon polyp 09/1997  . Anemia   . Aortic stenosis    s/p st. jude mechanical AVR - Chronic Coumadin  . Blood transfusion    "related to ITP"  . CHF (congestive heart failure) ([place])   . COPD (chronic obstructive pulmonary disease) ([place])   . Coronary artery disease    s/p cabg x 3 11/2003: lima-lad, seq vg to rpda and rpl  . Diverticulitis of colon   . Dysrhythmia   . Heart murmur   . Hyperlipidemia   . Hypertension   . ITP (idiopathic thrombocytopenic purpura)   . Lung cancer ([place])    last week  . Peripheral arterial disease ([place])    a. history of aortobifemoral bypass grafting by Dr. Sherren Mocha early b. LE angiography 04/22/2015 patent aortobifem graft, DES to R SFA  . Peripheral vascular disease ([place])    s/p Left external Iliac Artery stenting and  subsequent left femoral endarterectomy 02/2011- post op course complicated by wound infxn req I&D 03/2011  . Renal artery stenosis, native, bilateral ([place])    a. bilateral renal artery stenosis by recent duplex ultrasound b. L renal artery stent 02/2015, R renal artery patent on angiogram  . Stroke ([place]) 2015  . TIA (transient ischemic attack) ~ 2013  . Type II diabetes mellitus (HCC)      Family History  Problem Relation Age of Onset  . Coronary artery disease Mother        bypass surgery - deceased  . Heart disease Father        murmur, valve replacement - deceased  . Breast cancer Sister   . Diabetes Other        grandmother  . Diabetes Paternal Grandmother   . Diabetes Paternal Aunt   . Colon cancer Neg Hx   . Colon polyps Neg Hx   . Esophageal cancer Neg Hx   . Rectal cancer Neg Hx   . Stomach cancer Neg Hx      Past Surgical History:  Procedure Laterality Date  . ABDOMINAL AORTAGRAM N/A 12/16/2011   Procedure: ABDOMINAL Maxcine Ham;  Surgeon: Sherren Mocha, MD;  Location: [facility] CATH LAB;  Service: Cardiovascular;  Laterality: N/A;  . ABDOMINAL AORTOGRAM W/LOWER EXTREMITY Right 02/29/2020   Procedure: ABDOMINAL AORTOGRAM W/LOWER EXTREMITY;  Surgeon: Lorretta Harp, MD;  Location: [place] CV LAB;  Service: Cardiovascular;  Laterality: Right;  . ANGIOPLASTY / STENTING ILIAC     Left external Iliac Artery  . AORTA - BILATERAL FEMORAL ARTERY BYPASS GRAFT  01/18/2012   Procedure: AORTA BIFEMORAL BYPASS GRAFT;  Surgeon: Curt Jews, MD;  Location: [place];  Service: Vascular;  Laterality: N/A;  . AORTIC VALVE REPLACEMENT  ~ 2004  . ATRIAL FIBRILLATION ABLATION N/A 11/20/2020   Procedure: ATRIAL FIBRILLATION ABLATION;  Surgeon: Constance Haw, MD;  Location: [place] CV LAB;  Service: Cardiovascular;  Laterality: N/A;  . CARDIAC CATHETERIZATION  11/2003   /pt report 10/01/2016  . CARDIAC VALVE REPLACEMENT  11/2003   aortic  . CARDIOVERSION N/A 10/16/2019   Procedure:  CARDIOVERSION;  Surgeon: Larey Dresser, MD;  Location: [facility] ENDOSCOPY;  Service: Cardiovascular;  Laterality: N/A;  . CARDIOVERSION N/A 01/01/2021   Procedure: CARDIOVERSION;  Surgeon: Larey Dresser, MD;  Location: [facility] ENDOSCOPY;  Service: Cardiovascular;  Laterality: N/A;  . CATARACT EXTRACTION W/ INTRAOCULAR LENS  IMPLANT, BILATERAL Bilateral   . COLONOSCOPY    . COLONOSCOPY WITH PROPOFOL N/A 09/03/2019   Procedure: COLONOSCOPY WITH PROPOFOL;  Surgeon: Doran Stabler, MD;  Location: [facility] ENDOSCOPY;  Service: Gastroenterology;  Laterality: N/A;  . CORONARY ARTERY BYPASS GRAFT  11/2003   Archie Endo 04/21/2011  . CORONARY ATHERECTOMY N/A 10/10/2019   Procedure: CORONARY ATHERECTOMY;  Surgeon: Sherren Mocha, MD;  Location: [place] CV LAB;  Service: Cardiovascular;  Laterality: N/A;  . CORONARY STENT INTERVENTION N/A 10/10/2019   Procedure: CORONARY STENT INTERVENTION;  Surgeon: Sherren Mocha, MD;  Location: [place] CV LAB;  Service: Cardiovascular;  Laterality: N/A;  . CORONARY/GRAFT ANGIOGRAPHY N/A 09/18/2019   Procedure: CORONARY/GRAFT ANGIOGRAPHY;  Surgeon: Lorretta Harp, MD;  Location: [place] CV LAB;  Service: Cardiovascular;  Laterality: N/A;  . ESOPHAGOGASTRODUODENOSCOPY (EGD) WITH PROPOFOL N/A 02/27/2019   Procedure: ESOPHAGOGASTRODUODENOSCOPY (EGD) WITH PROPOFOL;  Surgeon: Doran Stabler, MD;  Location: [place];  Service: Endoscopy;  Laterality: N/A;  . HEMOSTASIS CLIP PLACEMENT  09/03/2019   Procedure: HEMOSTASIS CLIP PLACEMENT;  Surgeon: Doran Stabler, MD;  Location: [facility] ENDOSCOPY;  Service: Gastroenterology;;  . LOWER EXTREMITY ANGIOGRAM N/A 02/21/2015   Procedure: LOWER EXTREMITY ANGIOGRAM;  Surgeon: Lorretta Harp, MD;  Location: [facility] CATH LAB;  Service: Cardiovascular;  Laterality: N/A;  . PERIPHERAL VASCULAR CATHETERIZATION N/A 04/22/2015   Procedure: Lower Extremity Angiography;  Surgeon: Lorretta Harp, MD;  Location: [place] CV LAB;  Service:  Cardiovascular;  Laterality: N/A;  . PERIPHERAL VASCULAR CATHETERIZATION N/A 08/24/2016   Procedure: Lower Extremity Angiography;  Surgeon: Lorretta Harp, MD;  Location: [place] CV LAB;  Service: Cardiovascular;  Laterality: N/A;  . PERIPHERAL VASCULAR CATHETERIZATION Right 10/01/2016   Procedure: Peripheral Vascular Intervention - STENT;  Surgeon: Lorretta Harp, MD;  Location: [place] CV LAB;  Service: Cardiovascular;  Laterality: Right;  Prox and MID SFA   . PERIPHERAL VASCULAR INTERVENTION  02/29/2020   Procedure: PERIPHERAL VASCULAR INTERVENTION;  Surgeon: Lorretta Harp, MD;  Location: [place] CV LAB;  Service: Cardiovascular;;  Right SFA  . POLYPECTOMY    . RENAL ANGIOGRAM N/A 02/21/2015   Procedure: RENAL ANGIOGRAM;  Surgeon: Lorretta Harp, MD;  Location: [facility] CATH LAB;  Service: Cardiovascular;  Laterality: Bilateral; 6 mm x 12 mm long Herculink balloon expandable stent to the left renal artery  . RENAL ARTERY STENT Left 04/22/2015   dr berry  . SPLENECTOMY  02/2003   Archie Endo 04/21/2011  .  TEE WITHOUT CARDIOVERSION N/A 10/16/2019   Procedure: TRANSESOPHAGEAL ECHOCARDIOGRAM (TEE);  Surgeon: Larey Dresser, MD;  Location: [facility] ENDOSCOPY;  Service: Cardiovascular;  Laterality: N/A;  . TEMPORARY PACEMAKER N/A 10/10/2019   Procedure: TEMPORARY PACEMAKER;  Surgeon: Sherren Mocha, MD;  Location: [place] CV LAB;  Service: Cardiovascular;  Laterality: N/A;  . TONSILLECTOMY  ~ 1952    Social History   Socioeconomic History  . Marital status: Divorced    Spouse name: Not on file  . Number of children: 3  . Years of education: Not on file  . Highest education level: Not on file  Occupational History  . Occupation: Retired  Tobacco Use  . Smoking status: Former Smoker    Years: 30.00    Types: Cigarettes, Cigars    Quit date: 12/15/1993    Years since quitting: 27.0  . Smokeless tobacco: Never Used  . Tobacco comment: occasional cigar  Vaping Use  . Vaping Use:  Never used  Substance and Sexual Activity  . Alcohol use: Yes    Alcohol/week: 16.0 standard drinks    Types: 1 Cans of beer, 1 Shots of liquor, 14 Standard drinks or equivalent per week    Comment: drinks 2 martini's a night (2 shots in each)  . Drug use: No  . Sexual activity: Yes  Other Topics Concern  . Not on file  Social History Narrative   Tries to remain active.  Frequent golfer but claudication limits this.   Social Determinants of Health   Financial Resource Strain: Not on file  Food Insecurity: Not on file  Transportation Needs: Not on file  Physical Activity: Not on file  Stress: Not on file  Social Connections: Not on file  Intimate Partner Violence: Not on file     No Known Allergies   Outpatient Medications Prior to Visit  Medication Sig Dispense Refill  . amiodarone (PACERONE) 200 MG tablet Take 1 tablet (200 mg total) by mouth daily. 30 tablet 3  . amoxicillin (AMOXIL) 500 MG capsule Take 2,000 mg by mouth once.    [name] atorvastatin (LIPITOR) 80 MG tablet Take 80 mg by mouth daily.    [name] ENTRESTO 24-26 MG TAKE 1 TABLET BY MOUTH TWICE A DAY 60 tablet 0  . Evolocumab 140 MG/ML SOAJ Inject 140 mg into the skin every 14 (fourteen) days. 1st and 15th of each month    . furosemide (LASIX) 20 MG tablet Take 2 tablets (40 mg total) by mouth daily. 90 tablet 3  . glyBURIDE (DIABETA) 2.5 MG tablet Take 5 mg by mouth 2 (two) times daily with a meal.    . INVOKANA 100 MG TABS tablet Take 100 mg by mouth daily.    . nitroGLYCERIN (NITROSTAT) 0.4 MG SL tablet Place 1 tablet (0.4 mg total) under the tongue every 5 (five) minutes x 3 doses as needed for chest pain. 25 tablet 12  . spironolactone (ALDACTONE) 25 MG tablet Take 1 tablet (25 mg total) by mouth daily. 30 tablet 3  . warfarin (COUMADIN) 5 MG tablet Take 1 to 1 and 1/2 tablets by mouth daily as prescribed by the coumadin clinic. 120 tablet 0   No facility-administered medications prior to visit.    Review of Systems   Constitutional: Negative for chills, fever, malaise/fatigue and weight loss.  HENT: Negative for hearing loss, sore throat and tinnitus.   Eyes: Negative for blurred vision and double vision.  Respiratory: Negative for cough, hemoptysis, sputum production, shortness of breath, wheezing and  stridor.   Cardiovascular: Negative for chest pain, palpitations, orthopnea, leg swelling and PND.  Gastrointestinal: Negative for abdominal pain, constipation, diarrhea, heartburn, nausea and vomiting.  Genitourinary: Negative for dysuria, hematuria and urgency.  Musculoskeletal: Negative for joint pain and myalgias.  Skin: Negative for itching and rash.  Neurological: Negative for dizziness, tingling, weakness and headaches.  Endo/Heme/Allergies: Negative for environmental allergies. Does not bruise/bleed easily.  Psychiatric/Behavioral: Negative for depression. The patient is not nervous/anxious and does not have insomnia.   All other systems reviewed and are negative.    Objective:  Physical Exam Vitals reviewed.  Constitutional:      General: He is not in acute distress.    Appearance: He is well-developed and well-nourished.  HENT:     Head: Normocephalic and atraumatic.     Mouth/Throat:     Mouth: Oropharynx is clear and moist.  Eyes:     General: No scleral icterus.    Conjunctiva/sclera: Conjunctivae normal.     Pupils: Pupils are equal, round, and reactive to light.  Neck:     Vascular: No JVD.     Trachea: No tracheal deviation.  Cardiovascular:     Rate and Rhythm: Normal rate. Rhythm irregular.     Pulses: Intact distal pulses.     Heart sounds: Murmur heard.      Comments: Metallic click of AVR. Pulmonary:     Effort: Pulmonary effort is normal. No tachypnea, accessory muscle usage or respiratory distress.     Breath sounds: Normal breath sounds. No stridor. No wheezing, rhonchi or rales.  Abdominal:     General: Bowel sounds are normal. There is no distension.      Palpations: Abdomen is soft.     Tenderness: There is no abdominal tenderness.  Musculoskeletal:        General: No tenderness or edema.     Cervical back: Neck supple.  Lymphadenopathy:     Cervical: No cervical adenopathy.  Skin:    General: Skin is warm and dry.     Capillary Refill: Capillary refill takes less than 2 seconds.     Findings: No rash.  Neurological:     Mental Status: He is alert and oriented to person, place, and time.  Psychiatric:        Mood and Affect: Mood and affect normal.        Behavior: Behavior normal.      Vitals:   01/06/21 0929  BP: 110/72  Pulse: 89  Temp: 98 F (36.7 C)  TempSrc: Tympanic  SpO2: 96%  Weight: 176 lb (79.8 kg)  Height: 5\' 8"  (1.727 m)   96% on  RA BMI Readings from Last 3 Encounters:  01/06/21 26.76 kg/m  01/01/21 26.82 kg/m  12/25/20 26.91 kg/m   Wt Readings from Last 3 Encounters:  01/06/21 176 lb (79.8 kg)  01/01/21 176 lb 5.9 oz (80 kg)  12/25/20 177 lb (80.3 kg)     CBC    Component Value Date/Time   WBC 7.3 12/25/2020 1008   RBC 5.57 12/25/2020 1008   HGB 17.3 (H) 12/25/2020 1008   HGB 15.9 11/04/2020 1529   HCT 53.9 (H) 12/25/2020 1008   HCT 47.4 11/04/2020 1529   PLT 222 12/25/2020 1008   PLT 216 11/04/2020 1529   MCV 96.8 12/25/2020 1008   MCV 95 11/04/2020 1529   MCH 31.1 12/25/2020 1008   MCHC 32.1 12/25/2020 1008   RDW 14.7 12/25/2020 1008   RDW 13.3 11/04/2020 1529  LYMPHSABS 2.0 12/07/2020 2356   MONOABS 1.4 (H) 12/07/2020 2356   EOSABS 0.1 12/07/2020 2356   BASOSABS 0.1 12/07/2020 2356    Chest Imaging: 12/08/2020 CT chest: 11 x 16 mm lingular lung nodule concerning for primary bronchogenic carcinoma with lobulated edges. The patient's images have been independently reviewed by me.    12/13/2020 nuclear medicine PET/CT: A 15 mm lingular nodule SUV max 2.3 concerning for primary bronchogenic carcinoma. The patient's images have been independently reviewed by me.    Pulmonary  Functions Testing Results: No flowsheet data found.  FeNO:   Pathology:   Echocardiogram:   Heart Catheterization:     Assessment & Plan:     ICD-10-CM   1. Nodule of upper lobe of left lung  R91.1 Ambulatory referral to Pulmonology  2. Abnormal PET of left lung  R94.2   3. Former smoker  Z87.891   4. Centrilobular emphysema ([place])  J43.2   5. Atrial fibrillation, unspecified type (HCC)  I48.91 EKG 12-Lead  6. Irregular cardiac rhythm  I49.9     Discussion:  This is a 75 year old gentleman, history of severe coronary disease, history of AVR on Coumadin, heart failure on Entresto managed by heart failure clinic, recurrent atrial fibrillation recent cardioversion.  Today in the office to talk about left upper lobe lingular lung nodule concerning for stage I primary bronchogenic carcinoma.  Plan: Today we discussed the risk benefits and alternatives of proceeding with navigational bronchoscopy. I do not believe with all of his other current medical comorbidities that he is a good candidate for surgical resection.  Case was discussed at medical thoracic oncology conference and images reviewed. The team was in agreements with proceeding for navigational bronchoscopy, fiducial placement and referral for SBRT. If we were able to establish tissue diagnosis. We discussed the risk of bleeding as well as pneumothorax rates. Patient is agreeable to proceed. We will work with the Coumadin clinic to hold his Coumadin as well as bridge with Lovenox. We will also have a repeat ECG today in the office because on exam he does sound irregular.  We will need to confirm whether or not he is back in A. fib. I will reach out to Dr. Aundra Dubin to make sure he is okay with [place] holding his Coumadin.  Patient has been on and off in the past he says and is comfortable with bridging.  He has an appointment with Coumadin clinic tomorrow.    Current Outpatient Medications:  .  amiodarone (PACERONE) 200 MG tablet,  Take 1 tablet (200 mg total) by mouth daily., Disp: 30 tablet, Rfl: 3 .  amoxicillin (AMOXIL) 500 MG capsule, Take 2,000 mg by mouth once., Disp: , Rfl:  .  atorvastatin (LIPITOR) 80 MG tablet, Take 80 mg by mouth daily., Disp: , Rfl:  .  ENTRESTO 24-26 MG, TAKE 1 TABLET BY MOUTH TWICE A DAY, Disp: 60 tablet, Rfl: 0 .  Evolocumab 140 MG/ML SOAJ, Inject 140 mg into the skin every 14 (fourteen) days. 1st and 15th of each month, Disp: , Rfl:  .  furosemide (LASIX) 20 MG tablet, Take 2 tablets (40 mg total) by mouth daily., Disp: 90 tablet, Rfl: 3 .  glyBURIDE (DIABETA) 2.5 MG tablet, Take 5 mg by mouth 2 (two) times daily with a meal., Disp: , Rfl:  .  INVOKANA 100 MG TABS tablet, Take 100 mg by mouth daily., Disp: , Rfl:  .  nitroGLYCERIN (NITROSTAT) 0.4 MG SL tablet, Place 1 tablet (0.4 mg total) under  the tongue every 5 (five) minutes x 3 doses as needed for chest pain., Disp: 25 tablet, Rfl: 12 .  spironolactone (ALDACTONE) 25 MG tablet, Take 1 tablet (25 mg total) by mouth daily., Disp: 30 tablet, Rfl: 3 .  warfarin (COUMADIN) 5 MG tablet, Take 1 to 1 and 1/2 tablets by mouth daily as prescribed by the coumadin clinic., Disp: 120 tablet, Rfl: 0  I spent 61 minutes dedicated to the care of this patient on the date of this encounter to include pre-visit review of records, face-to-face time with the patient discussing conditions above, post visit ordering of testing, clinical documentation with the electronic health record, making appropriate referrals as documented, and communicating necessary findings to members of the patients care team.   Garner Nash, [facility] 01/06/2021 10:07 AM

## 2021-01-06 NOTE — Telephone Encounter (Signed)
Pt has next INR check on 2/1. Will coordinate Lovenox bridging with pt in Coumadin clinic prior to 2/22 procedure.

## 2021-01-06 NOTE — Patient Instructions (Addendum)
Thank you for visiting Dr. Valeta Harms at [facility]. Today we recommend the following:  Orders Placed This Encounter  Procedures  . Ambulatory referral to Pulmonology   ECG today in the office. Contact coumadin clinic for bridging needs   Nothing eat the night before case.  Date planned: 01/17/2021.   Return in about 6 weeks (around 02/17/2021) for with APP or Dr. Valeta Harms.    Please do your part to reduce the spread of COVID-19.

## 2021-01-06 NOTE — Telephone Encounter (Signed)
Let us know when the procedure will be and the coumadin clinic will be happy to help with the Lovenox bridge. Thank you!

## 2021-01-06 NOTE — Telephone Encounter (Signed)
Please arrange Lovenox bridge with coumadin clinic when he will get bronchoscopy.

## 2021-01-06 NOTE — Telephone Encounter (Signed)
I attempted to call patient.  He did not answer. I left a voice mail.  Garner Nash, DO [place] [facility] 01/06/2021 6:23 PM

## 2021-01-06 NOTE — Telephone Encounter (Signed)
Pt has been r/s to the following: COVID TEST - 2/19 @ 12:10 ENB - 2/22 @ 7:30  I left a detailed message on pt's VM w/ appt info & asked pt to confirm this with me.    Ebony Hail, - if you will please reach out to the patient about the Lovenox bridge.

## 2021-01-06 NOTE — H&P (View-Only) (Signed)
Synopsis: Referred in January 2021 for PET avid lung nodule by Shon Baton, MD  Subjective:   PATIENT ID: [name] [name] GENDER: male DOB: 1946-01-18, MRN: [id number]  Chief Complaint  Patient presents with  . Consult    Lung nodule-- Ct angio and PET scans done    This is a 75 year old gentleman past medical history of aortic stenosis, mechanical Saint Jude AVR on Coumadin, heart failure, COPD, history of coronary artery disease status post CABG, ITP, peripheral arterial disease, TIA, type 2 diabetes.Patient seen today in the office to discuss CT scan results.  Initial CT was completed at the beginning of January which revealed a 1.5 cm lingular nodule.  Patient had a PET scan completed which revealed an SUV max of 2.311 x 16 mm lingular nodule concerning for primary bronchogenic carcinoma.  Patient has a significant cardiac history.  Last week was taken to endoscopy for cardioversion.  By Dr. Aundra Dubin.  Currently anticoagulated with Coumadin.  History of AVR.  He has no complaints today from a respiratory standpoint.  Former smoker.  Denies hemoptysis weight loss fatigue.   Past Medical History:  Diagnosis Date  . Adenomatous colon polyp 09/1997  . Anemia   . Aortic stenosis    s/p st. jude mechanical AVR - Chronic Coumadin  . Blood transfusion    "related to ITP"  . CHF (congestive heart failure) ([place])   . COPD (chronic obstructive pulmonary disease) ([place])   . Coronary artery disease    s/p cabg x 3 11/2003: lima-lad, seq vg to rpda and rpl  . Diverticulitis of colon   . Dysrhythmia   . Heart murmur   . Hyperlipidemia   . Hypertension   . ITP (idiopathic thrombocytopenic purpura)   . Lung cancer ([place])    last week  . Peripheral arterial disease ([place])    a. history of aortobifemoral bypass grafting by Dr. Sherren Mocha early b. LE angiography 04/22/2015 patent aortobifem graft, DES to R SFA  . Peripheral vascular disease ([place])    s/p Left external Iliac Artery stenting and  subsequent left femoral endarterectomy 02/2011- post op course complicated by wound infxn req I&D 03/2011  . Renal artery stenosis, native, bilateral ([place])    a. bilateral renal artery stenosis by recent duplex ultrasound b. L renal artery stent 02/2015, R renal artery patent on angiogram  . Stroke ([place]) 2015  . TIA (transient ischemic attack) ~ 2013  . Type II diabetes mellitus (HCC)      Family History  Problem Relation Age of Onset  . Coronary artery disease Mother        bypass surgery - deceased  . Heart disease Father        murmur, valve replacement - deceased  . Breast cancer Sister   . Diabetes Other        grandmother  . Diabetes Paternal Grandmother   . Diabetes Paternal Aunt   . Colon cancer Neg Hx   . Colon polyps Neg Hx   . Esophageal cancer Neg Hx   . Rectal cancer Neg Hx   . Stomach cancer Neg Hx      Past Surgical History:  Procedure Laterality Date  . ABDOMINAL AORTAGRAM N/A 12/16/2011   Procedure: ABDOMINAL Maxcine Ham;  Surgeon: Sherren Mocha, MD;  Location[facility] CATH LAB;  Service: Cardiovascular;  Laterality: N/A;  . ABDOMINAL AORTOGRAM W/LOWER EXTREMITY Right 02/29/2020   Procedure: ABDOMINAL AORTOGRAM W/LOWER EXTREMITY;  Surgeon: Lorretta Harp, MD;  Location: [place] CV LAB;  Service: Cardiovascular;  Laterality: Right;  . ANGIOPLASTY / STENTING ILIAC     Left external Iliac Artery  . AORTA - BILATERAL FEMORAL ARTERY BYPASS GRAFT  01/18/2012   Procedure: AORTA BIFEMORAL BYPASS GRAFT;  Surgeon: Curt Jews, MD;  Location: [place];  Service: Vascular;  Laterality: N/A;  . AORTIC VALVE REPLACEMENT  ~ 2004  . ATRIAL FIBRILLATION ABLATION N/A 11/20/2020   Procedure: ATRIAL FIBRILLATION ABLATION;  Surgeon: Constance Haw, MD;  Location: [place] CV LAB;  Service: Cardiovascular;  Laterality: N/A;  . CARDIAC CATHETERIZATION  11/2003   /pt report 10/01/2016  . CARDIAC VALVE REPLACEMENT  11/2003   aortic  . CARDIOVERSION N/A 10/16/2019   Procedure:  CARDIOVERSION;  Surgeon: Larey Dresser, MD;  Location: [facility] ENDOSCOPY;  Service: Cardiovascular;  Laterality: N/A;  . CARDIOVERSION N/A 01/01/2021   Procedure: CARDIOVERSION;  Surgeon: Larey Dresser, MD;  Location: [facility] ENDOSCOPY;  Service: Cardiovascular;  Laterality: N/A;  . CATARACT EXTRACTION W/ INTRAOCULAR LENS  IMPLANT, BILATERAL Bilateral   . COLONOSCOPY    . COLONOSCOPY WITH PROPOFOL N/A 09/03/2019   Procedure: COLONOSCOPY WITH PROPOFOL;  Surgeon: Doran Stabler, MD;  Location: [facility] ENDOSCOPY;  Service: Gastroenterology;  Laterality: N/A;  . CORONARY ARTERY BYPASS GRAFT  11/2003   Archie Endo 04/21/2011  . CORONARY ATHERECTOMY N/A 10/10/2019   Procedure: CORONARY ATHERECTOMY;  Surgeon: Sherren Mocha, MD;  Location: [place] CV LAB;  Service: Cardiovascular;  Laterality: N/A;  . CORONARY STENT INTERVENTION N/A 10/10/2019   Procedure: CORONARY STENT INTERVENTION;  Surgeon: Sherren Mocha, MD;  Location: [place] CV LAB;  Service: Cardiovascular;  Laterality: N/A;  . CORONARY/GRAFT ANGIOGRAPHY N/A 09/18/2019   Procedure: CORONARY/GRAFT ANGIOGRAPHY;  Surgeon: Lorretta Harp, MD;  Location: [place] CV LAB;  Service: Cardiovascular;  Laterality: N/A;  . ESOPHAGOGASTRODUODENOSCOPY (EGD) WITH PROPOFOL N/A 02/27/2019   Procedure: ESOPHAGOGASTRODUODENOSCOPY (EGD) WITH PROPOFOL;  Surgeon: Doran Stabler, MD;  Location: [place];  Service: Endoscopy;  Laterality: N/A;  . HEMOSTASIS CLIP PLACEMENT  09/03/2019   Procedure: HEMOSTASIS CLIP PLACEMENT;  Surgeon: Doran Stabler, MD;  Location: [facility] ENDOSCOPY;  Service: Gastroenterology;;  . LOWER EXTREMITY ANGIOGRAM N/A 02/21/2015   Procedure: LOWER EXTREMITY ANGIOGRAM;  Surgeon: Lorretta Harp, MD;  Location: [facility] CATH LAB;  Service: Cardiovascular;  Laterality: N/A;  . PERIPHERAL VASCULAR CATHETERIZATION N/A 04/22/2015   Procedure: Lower Extremity Angiography;  Surgeon: Lorretta Harp, MD;  Location: [place] CV LAB;  Service:  Cardiovascular;  Laterality: N/A;  . PERIPHERAL VASCULAR CATHETERIZATION N/A 08/24/2016   Procedure: Lower Extremity Angiography;  Surgeon: Lorretta Harp, MD;  Location: [place] CV LAB;  Service: Cardiovascular;  Laterality: N/A;  . PERIPHERAL VASCULAR CATHETERIZATION Right 10/01/2016   Procedure: Peripheral Vascular Intervention - STENT;  Surgeon: Lorretta Harp, MD;  Location: [place] CV LAB;  Service: Cardiovascular;  Laterality: Right;  Prox and MID SFA   . PERIPHERAL VASCULAR INTERVENTION  02/29/2020   Procedure: PERIPHERAL VASCULAR INTERVENTION;  Surgeon: Lorretta Harp, MD;  Location: [place] CV LAB;  Service: Cardiovascular;;  Right SFA  . POLYPECTOMY    . RENAL ANGIOGRAM N/A 02/21/2015   Procedure: RENAL ANGIOGRAM;  Surgeon: Lorretta Harp, MD;  Location: [facility] CATH LAB;  Service: Cardiovascular;  Laterality: Bilateral; 6 mm x 12 mm long Herculink balloon expandable stent to the left renal artery  . RENAL ARTERY STENT Left 04/22/2015   dr berry  . SPLENECTOMY  02/2003   Archie Endo 04/21/2011  .  TEE WITHOUT CARDIOVERSION N/A 10/16/2019   Procedure: TRANSESOPHAGEAL ECHOCARDIOGRAM (TEE);  Surgeon: Larey Dresser, MD;  Location: [facility] ENDOSCOPY;  Service: Cardiovascular;  Laterality: N/A;  . TEMPORARY PACEMAKER N/A 10/10/2019   Procedure: TEMPORARY PACEMAKER;  Surgeon: Sherren Mocha, MD;  Location: [place] CV LAB;  Service: Cardiovascular;  Laterality: N/A;  . TONSILLECTOMY  ~ 1952    Social History   Socioeconomic History  . Marital status: Divorced    Spouse name: Not on file  . Number of children: 3  . Years of education: Not on file  . Highest education level: Not on file  Occupational History  . Occupation: Retired  Tobacco Use  . Smoking status: Former Smoker    Years: 30.00    Types: Cigarettes, Cigars    Quit date: 12/15/1993    Years since quitting: 27.0  . Smokeless tobacco: Never Used  . Tobacco comment: occasional cigar  Vaping Use  . Vaping Use:  Never used  Substance and Sexual Activity  . Alcohol use: Yes    Alcohol/week: 16.0 standard drinks    Types: 1 Cans of beer, 1 Shots of liquor, 14 Standard drinks or equivalent per week    Comment: drinks 2 martini's a night (2 shots in each)  . Drug use: No  . Sexual activity: Yes  Other Topics Concern  . Not on file  Social History Narrative   Tries to remain active.  Frequent golfer but claudication limits this.   Social Determinants of Health   Financial Resource Strain: Not on file  Food Insecurity: Not on file  Transportation Needs: Not on file  Physical Activity: Not on file  Stress: Not on file  Social Connections: Not on file  Intimate Partner Violence: Not on file     No Known Allergies   Outpatient Medications Prior to Visit  Medication Sig Dispense Refill  . amiodarone (PACERONE) 200 MG tablet Take 1 tablet (200 mg total) by mouth daily. 30 tablet 3  . amoxicillin (AMOXIL) 500 MG capsule Take 2,000 mg by mouth once.    [name] atorvastatin (LIPITOR) 80 MG tablet Take 80 mg by mouth daily.    [name] ENTRESTO 24-26 MG TAKE 1 TABLET BY MOUTH TWICE A DAY 60 tablet 0  . Evolocumab 140 MG/ML SOAJ Inject 140 mg into the skin every 14 (fourteen) days. 1st and 15th of each month    . furosemide (LASIX) 20 MG tablet Take 2 tablets (40 mg total) by mouth daily. 90 tablet 3  . glyBURIDE (DIABETA) 2.5 MG tablet Take 5 mg by mouth 2 (two) times daily with a meal.    . INVOKANA 100 MG TABS tablet Take 100 mg by mouth daily.    . nitroGLYCERIN (NITROSTAT) 0.4 MG SL tablet Place 1 tablet (0.4 mg total) under the tongue every 5 (five) minutes x 3 doses as needed for chest pain. 25 tablet 12  . spironolactone (ALDACTONE) 25 MG tablet Take 1 tablet (25 mg total) by mouth daily. 30 tablet 3  . warfarin (COUMADIN) 5 MG tablet Take 1 to 1 and 1/2 tablets by mouth daily as prescribed by the coumadin clinic. 120 tablet 0   No facility-administered medications prior to visit.    Review of Systems   Constitutional: Negative for chills, fever, malaise/fatigue and weight loss.  HENT: Negative for hearing loss, sore throat and tinnitus.   Eyes: Negative for blurred vision and double vision.  Respiratory: Negative for cough, hemoptysis, sputum production, shortness of breath, wheezing and  stridor.   Cardiovascular: Negative for chest pain, palpitations, orthopnea, leg swelling and PND.  Gastrointestinal: Negative for abdominal pain, constipation, diarrhea, heartburn, nausea and vomiting.  Genitourinary: Negative for dysuria, hematuria and urgency.  Musculoskeletal: Negative for joint pain and myalgias.  Skin: Negative for itching and rash.  Neurological: Negative for dizziness, tingling, weakness and headaches.  Endo/Heme/Allergies: Negative for environmental allergies. Does not bruise/bleed easily.  Psychiatric/Behavioral: Negative for depression. The patient is not nervous/anxious and does not have insomnia.   All other systems reviewed and are negative.    Objective:  Physical Exam Vitals reviewed.  Constitutional:      General: He is not in acute distress.    Appearance: He is well-developed and well-nourished.  HENT:     Head: Normocephalic and atraumatic.     Mouth/Throat:     Mouth: Oropharynx is clear and moist.  Eyes:     General: No scleral icterus.    Conjunctiva/sclera: Conjunctivae normal.     Pupils: Pupils are equal, round, and reactive to light.  Neck:     Vascular: No JVD.     Trachea: No tracheal deviation.  Cardiovascular:     Rate and Rhythm: Normal rate. Rhythm irregular.     Pulses: Intact distal pulses.     Heart sounds: Murmur heard.      Comments: Metallic click of AVR. Pulmonary:     Effort: Pulmonary effort is normal. No tachypnea, accessory muscle usage or respiratory distress.     Breath sounds: Normal breath sounds. No stridor. No wheezing, rhonchi or rales.  Abdominal:     General: Bowel sounds are normal. There is no distension.      Palpations: Abdomen is soft.     Tenderness: There is no abdominal tenderness.  Musculoskeletal:        General: No tenderness or edema.     Cervical back: Neck supple.  Lymphadenopathy:     Cervical: No cervical adenopathy.  Skin:    General: Skin is warm and dry.     Capillary Refill: Capillary refill takes less than 2 seconds.     Findings: No rash.  Neurological:     Mental Status: He is alert and oriented to person, place, and time.  Psychiatric:        Mood and Affect: Mood and affect normal.        Behavior: Behavior normal.      Vitals:   01/06/21 0929  BP: 110/72  Pulse: 89  Temp: 98 F (36.7 C)  TempSrc: Tympanic  SpO2: 96%  Weight: 176 lb (79.8 kg)  Height: 5\' 8"  (1.727 m)   96% on  RA BMI Readings from Last 3 Encounters:  01/06/21 26.76 kg/m  01/01/21 26.82 kg/m  12/25/20 26.91 kg/m   Wt Readings from Last 3 Encounters:  01/06/21 176 lb (79.8 kg)  01/01/21 176 lb 5.9 oz (80 kg)  12/25/20 177 lb (80.3 kg)     CBC    Component Value Date/Time   WBC 7.3 12/25/2020 1008   RBC 5.57 12/25/2020 1008   HGB 17.3 (H) 12/25/2020 1008   HGB 15.9 11/04/2020 1529   HCT 53.9 (H) 12/25/2020 1008   HCT 47.4 11/04/2020 1529   PLT 222 12/25/2020 1008   PLT 216 11/04/2020 1529   MCV 96.8 12/25/2020 1008   MCV 95 11/04/2020 1529   MCH 31.1 12/25/2020 1008   MCHC 32.1 12/25/2020 1008   RDW 14.7 12/25/2020 1008   RDW 13.3 11/04/2020 1529  LYMPHSABS 2.0 12/07/2020 2356   MONOABS 1.4 (H) 12/07/2020 2356   EOSABS 0.1 12/07/2020 2356   BASOSABS 0.1 12/07/2020 2356    Chest Imaging: 12/08/2020 CT chest: 11 x 16 mm lingular lung nodule concerning for primary bronchogenic carcinoma with lobulated edges. The patient's images have been independently reviewed by me.    12/13/2020 nuclear medicine PET/CT: A 15 mm lingular nodule SUV max 2.3 concerning for primary bronchogenic carcinoma. The patient's images have been independently reviewed by me.    Pulmonary  Functions Testing Results: No flowsheet data found.  FeNO:   Pathology:   Echocardiogram:   Heart Catheterization:     Assessment & Plan:     ICD-10-CM   1. Nodule of upper lobe of left lung  R91.1 Ambulatory referral to Pulmonology  2. Abnormal PET of left lung  R94.2   3. Former smoker  Z87.891   4. Centrilobular emphysema ([place])  J43.2   5. Atrial fibrillation, unspecified type (HCC)  I48.91 EKG 12-Lead  6. Irregular cardiac rhythm  I49.9     Discussion:  This is a 75 year old gentleman, history of severe coronary disease, history of AVR on Coumadin, heart failure on Entresto managed by heart failure clinic, recurrent atrial fibrillation recent cardioversion.  Today in the office to talk about left upper lobe lingular lung nodule concerning for stage I primary bronchogenic carcinoma.  Plan: Today we discussed the risk benefits and alternatives of proceeding with navigational bronchoscopy. I do not believe with all of his other current medical comorbidities that he is a good candidate for surgical resection.  Case was discussed at medical thoracic oncology conference and images reviewed. The team was in agreements with proceeding for navigational bronchoscopy, fiducial placement and referral for SBRT. If we were able to establish tissue diagnosis. We discussed the risk of bleeding as well as pneumothorax rates. Patient is agreeable to proceed. We will work with the Coumadin clinic to hold his Coumadin as well as bridge with Lovenox. We will also have a repeat ECG today in the office because on exam he does sound irregular.  We will need to confirm whether or not he is back in A. fib. I will reach out to Dr. Aundra Dubin to make sure he is okay with [place] holding his Coumadin.  Patient has been on and off in the past he says and is comfortable with bridging.  He has an appointment with Coumadin clinic tomorrow.    Current Outpatient Medications:  .  amiodarone (PACERONE) 200 MG tablet,  Take 1 tablet (200 mg total) by mouth daily., Disp: 30 tablet, Rfl: 3 .  amoxicillin (AMOXIL) 500 MG capsule, Take 2,000 mg by mouth once., Disp: , Rfl:  .  atorvastatin (LIPITOR) 80 MG tablet, Take 80 mg by mouth daily., Disp: , Rfl:  .  ENTRESTO 24-26 MG, TAKE 1 TABLET BY MOUTH TWICE A DAY, Disp: 60 tablet, Rfl: 0 .  Evolocumab 140 MG/ML SOAJ, Inject 140 mg into the skin every 14 (fourteen) days. 1st and 15th of each month, Disp: , Rfl:  .  furosemide (LASIX) 20 MG tablet, Take 2 tablets (40 mg total) by mouth daily., Disp: 90 tablet, Rfl: 3 .  glyBURIDE (DIABETA) 2.5 MG tablet, Take 5 mg by mouth 2 (two) times daily with a meal., Disp: , Rfl:  .  INVOKANA 100 MG TABS tablet, Take 100 mg by mouth daily., Disp: , Rfl:  .  nitroGLYCERIN (NITROSTAT) 0.4 MG SL tablet, Place 1 tablet (0.4 mg total) under  the tongue every 5 (five) minutes x 3 doses as needed for chest pain., Disp: 25 tablet, Rfl: 12 .  spironolactone (ALDACTONE) 25 MG tablet, Take 1 tablet (25 mg total) by mouth daily., Disp: 30 tablet, Rfl: 3 .  warfarin (COUMADIN) 5 MG tablet, Take 1 to 1 and 1/2 tablets by mouth daily as prescribed by the coumadin clinic., Disp: 120 tablet, Rfl: 0  I spent 61 minutes dedicated to the care of this patient on the date of this encounter to include pre-visit review of records, face-to-face time with the patient discussing conditions above, post visit ordering of testing, clinical documentation with the electronic health record, making appropriate referrals as documented, and communicating necessary findings to members of the patients care team.   Garner Nash, [facility] 01/06/2021 10:07 AM

## 2021-01-06 NOTE — Telephone Encounter (Signed)
Pt called into Coumadin clinic upset, stating he was called by Pulmonary/Dr Icard's office and told they were going to have to postpone and reschedule pt's upcoming bronchoscopy/bx appt from 01/17/21 to 01/28/21 because Coumadin Clinic advised them they would have to postpone.  I reassured pt it was not the Coumadin Clinic who asked that his procedure be postponed.  Upon further review of pt's telephone notes, Dr Aundra Dubin requested bronchoscopy/bx be postponed until 4 weeks after most recent cardioversion on 01/01/21 d/t increased risk of holding Warfarin post procedure.  Explained to pt that is was Dr Aundra Dubin that asked for this procedure to be postponed not the Coumadin Clinic and also explained that this was for his own safety post cardioversion.  Pt verbalized understanding and thanked me for explaining this to him.  Pt has an appt in the Coumadin Clinic tomorrow 01/07/21 for INR check, advised pt we can coordinate upcoming Lovenox bridge at that appt.

## 2021-01-06 NOTE — Telephone Encounter (Signed)
The patient is concerned with the delay & would like to speak w/ Dr. Valeta Harms about the delay, please.

## 2021-01-06 NOTE — Telephone Encounter (Signed)
Patient aware.

## 2021-01-06 NOTE — Telephone Encounter (Signed)
-----   Message from Larey Dresser, MD sent at 01/03/2021  4:42 PM EST ----- Stable mild left SFA disease.

## 2021-01-07 ENCOUNTER — Ambulatory Visit (INDEPENDENT_AMBULATORY_CARE_PROVIDER_SITE_OTHER): Payer: Medicare Other | Admitting: *Deleted

## 2021-01-07 DIAGNOSIS — Z5181 Encounter for therapeutic drug level monitoring: Secondary | ICD-10-CM | POA: Diagnosis not present

## 2021-01-07 DIAGNOSIS — I359 Nonrheumatic aortic valve disorder, unspecified: Secondary | ICD-10-CM

## 2021-01-07 LAB — POCT INR: INR: 2.6 (ref 2.0–3.0)

## 2021-01-07 NOTE — Patient Instructions (Signed)
Description   Continue to take 1 tablet daily except for 1.5 tablets on Sunday and Thursday.  Amio increased from 100mg  daily to 200mg  daily on 12/25/2020. Recheck INR in 2 weeks. Coumadin Clinic for any changes in medications or upcoming procedures. [phone]

## 2021-01-08 ENCOUNTER — Ambulatory Visit (INDEPENDENT_AMBULATORY_CARE_PROVIDER_SITE_OTHER): Payer: Medicare Other | Admitting: Gastroenterology

## 2021-01-08 ENCOUNTER — Encounter: Payer: Self-pay | Admitting: Gastroenterology

## 2021-01-08 VITALS — BP 144/80 | HR 76 | Ht 68.0 in | Wt 177.0 lb

## 2021-01-08 DIAGNOSIS — K5900 Constipation, unspecified: Secondary | ICD-10-CM | POA: Diagnosis not present

## 2021-01-08 DIAGNOSIS — Z8711 Personal history of peptic ulcer disease: Secondary | ICD-10-CM

## 2021-01-08 DIAGNOSIS — Z86010 Personal history of colonic polyps: Secondary | ICD-10-CM

## 2021-01-08 MED ORDER — PANTOPRAZOLE SODIUM 40 MG PO TBEC: 40.0000 mg | DELAYED_RELEASE_TABLET | Freq: Every day | ORAL | 11 refills | Status: DC

## 2021-01-08 NOTE — Patient Instructions (Signed)
We have sent the following medications to your pharmacy for you to pick up at your convenience: pantoprazole.   Take over the counter Miralax daily for constipation.   Thank you for choosing me and [facility].  Pricilla Riffle. Dagoberto Ligas., MD., [facility]

## 2021-01-08 NOTE — Progress Notes (Signed)
    History of Present Illness: This is a 75 year old male complaining of a change in bowel habits with constipation and increased gas.  He has a history of bleeding gastric ulcers in March 2020.  He is S/P mechanical aortic valve replacement, maintained on Coumadin.  Colonoscopy as below.  Unfortunately he had a post polypectomy bleed following the colonoscopy requiring repeat colonoscopy with Endo Clips applied to several post polypectomy ulcers.  Several weeks ago he noted increased constipation with occasional abdominal cramping and increased intestinal gas.  He made several dietary changes eating more fruits and his constipation and gas have resolved.  No other gastrointestinal complaints.   Colonoscopy 08/2019: - One 8 mm polyp in the cecum, removed with a cold snare. Resected and retrieved. - One 10 mm polyp in the cecum, removed with a hot snare. Resected and retrieved. - One 8 mm polyp in the transverse colon, removed with a hot snare. Resected and retrieved. - One 6 mm polyp in the descending colon, removed with a cold snare. Resected and retrieved. 2 clips placed for hemostasis. - Moderate diverticulosis in the entire examined colon. - Internal hemorrhoids. - The examination was otherwise normal on direct and retroflexion views.   Current Medications, Allergies, Past Medical History, Past Surgical History, Family History and Social History were reviewed in [employer] record.   Physical Exam: General: Well developed, well nourished, no acute distress Head: Normocephalic and atraumatic Eyes: Sclerae anicteric, EOMI Ears: Normal auditory acuity Mouth: Not examined, mask on during Covid-19 pandemic Lungs: Clear throughout to auscultation Heart: Regular rate and rhythm; no murmurs, rubs or bruits Abdomen: Soft, non tender and non distended. No masses, hepatosplenomegaly or hernias noted. Normal Bowel sounds Rectal: Not done Musculoskeletal: Symmetrical with no  gross deformities  Pulses:  Normal pulses noted Extremities: No clubbing, cyanosis, edema or deformities noted Neurological: Alert oriented x 4, grossly nonfocal Psychological:  Alert and cooperative. Normal mood and affect   Assessment and Recommendations:  1.  Change in bowel habits, mild constipation.  Symptoms resolved with dietary changes.  Encouraged a long-term high-fiber diet with adequate daily fluid intake and if this is not sufficient begin MiraLAX, or generic equivalent, once daily or every other day titrated for adequate daily bowel movements. GI follow up prn.   2.  History of gastric ulcers with hemorrhage in 2020. Recommend lifelong PPI qd to help prevent recurrent ulcers.  Resume pantoprazole 40 mg po daily long-term. GI follow up prn.   3.  Personal history of several adenomatous colon polyps and one SSA.  Due to his age and comorbidities we will not plan for future routine surveillance colonoscopies.  4. S/P mechanical AVR maintained on Coumadin.

## 2021-01-09 ENCOUNTER — Encounter: Payer: Medicare Other | Admitting: Cardiothoracic Surgery

## 2021-01-10 NOTE — Progress Notes (Incomplete)
ADVANCED HF CLINIC NOTE  PCP: Shon Baton, MD HF Cardiology: Dr. Velora Heckler a 75 y.o.malewith a history of CAD s/p CABG, mechanical AVR on coumadin, extensive PAD, DM, HTN, HLD, ITP, TIA, and recurrent GI bleed. Note, requires lovenox bridging off heparin.  Dr. Gwenlyn Found follows him for extensive PAD. He has had an aortobifem bypass in 2016 with follow up iliac stenting and femoral endarterectomy. In 2017, he had subsequent ostial and mid right SFA intervention. Hx of left renal artery stenting in 2016 with repeat intervention on left for 75% in-stent restenosis, with progression of disease on the right renal artery showing 60% stenosis on duplex 07/12/19. Echo 09/17/19 with normal EF of 60-65% and normal AVR function; chronic diastolic heart failure.   Admitted in 10/20 with chest pain. He underwent heart cath 09/18/19 that showed 99% left main stenosis and patent LIMA to occluded LAD and a patent sequential vein to the PDA and PLA of an occluded dominant right. His left main stenosis jeopardized the moderately large ramus and high first marginal and nondominant Cx which has 90% mid AV groove Cx stenosis. Aggressive medical therapy was recommended initially.  He was readmitted in 10/20 with CHF and chest pain. Hospital course complicated by A flutter/low output heart failure.  S/P successful orbital atherectomy, PTCA, and stenting of protected left main with a 4.0x15 mm resolute onyx DES 11/3. LIMA-LAD patent. SVG-PDA patent. ECHO this admission showed EF 30-35%, mechanical AoV ok. TEE 11/9 w/ improved EF, 40-45%. Placed on milrinone initially with low co-ox and later weaned off. He was cardioverted from atrial flutter back to NSR.   2/21 peripheral arterial dopplers showed 75-99% stenosis right SFA.  In 3/21, he had stenting to right SFA.    Echo in 6/21 showed EF up to 55-60%.    In 12/21, he had an atrial fibrillation ablation.   He was admitted in 1/22 with  shortness of breath and was found to be in atrial fibrillation with RVR. He was hypertensive.  Troponin was negative. He was diuresed and discharged, cardiology was not notified.  Echo during this admission showed EF 60-65%, normal RV size and systolic function, stable mechanical aortic valve.   During the 1/22 admission, CT chest showed a lung nodule.  Followup PET-CT showed probable early lung cancer.   He return 1/22 for CHF hospital follow up.  He was still in atypical atrial flutter.  Lasix was increased at recent admission, he says that breathing is back to normal.  Weight up 5 lbs compared.  No chest pain.  No dyspnea walking on flat ground, no orthopnea/PND, or palpitations.  INR has been therapeutic for > 1 month.  Arlyce Harman was restarted, amiodarone was increased to 200 mg daily, and he was set up for DCCV.    Today he returns for HF follow up. Overall feeling fine. Denies increasing SOB, CP, dizziness, edema, or PND/Orthopnea. Appetite ok. No fever or chills. Weight at home 170 pounds. Taking all medications.    ECG (personally reviewed): Atypical atrial flutter, rightward axis.   Labs (11/20): K 4.3, creatinine 1.3, hgb 12.5 Labs (12/20): LDL 56, HDL 80, TGs 179 Labs (1/21): K 4.8, creatinine 1.45 Labs (4/21): K 5.2, creatinine 1.34 Labs (6/21): LDL 44 Labs (1/22): K 4, creatinine 1.3  PMH  1. CAD:  S/P CABG (3/04) with LIMA-LAD, seq SVG-PDA/PLV.  - NSTEMI 10/20 with cath showing patent LIMA-LAD and SVG-PDA/PLV but 99% LM stenosis, occluded LAD, moderate ramus and high OM1  jeopardized, also 90% mid AV groove LCx stenosis.  Occluded RCA.  Patient ultimately had orbital atherectomy and stenting of left main with a 4.0 x 15 mm Resolute Onyx DES.  2. Mechanical aortic valve: TEE 11/20 showed stable-appearing mechanical valve, mean gradient 9 mmHg.  3. PAD: Aortobifemoral bypass in 2016 with follow up iliac stenting and femoral endarterectomy. In 2017, he had subsequent ostial and mid right  SFA intervention. - Angiography 10/20 with 80% in-stent restenosis in proximal right SFA.  - Peripheral arterial dopplers (2/21) with 75-99% R SFA stenosis in prior stented area.  - 3/21 Right SFA stent (Dr. Gwenlyn Found).  4. Type 2 diabetes.  5. HTN - Renal artery dopplers (8/21): No significant stenosis.  6. Hyperlipidemia 7. H/o ITP  8. TIA  9. Chronic Systolic HF: Ischemic cardiomyopathy.  - Echo (10/20): EF 30-35%, mechanical AoV ok.  - TEE (11/20): w/ improved EF, 40-45%. Mechanical aortic valve with mean gradient 9 mmHg, normal RV.  - Echo (6/21): EF 55-60%, mild LVH, mechanical AoV with mean gradient 9 mmHg.  - Echo (1/22): EF 60-65%, normal RV size and systolic function, stable mechanical aortic valve.  10. Atrial fibrillation: Paroxysmal.  - Atrial fibrillation ablation 12/21.  - DCCV (1/22): successful return to NSR. 11. Renal artery stenosis: Hx of left renal artery stenting in 2016 with repeat intervention on left for 75% in-stent restenosis, with progression of disease on the right renal artery showing 60% stenosis on duplex 07/12/19. 12. Carotid stenosis: 4/21 carotid dopplers with right subclavian stenosis, 40-59% RICA stenosis.  13. Suspected lung cancer: PET scan 1/22 suggestive of early lung cancer.   ROS: All systems negative except as listed in HPI, PMH and Problem List.  Social History   Socioeconomic History  . Marital status: Divorced    Spouse name: Not on file  . Number of children: 3  . Years of education: Not on file  . Highest education level: Not on file  Occupational History  . Occupation: Retired  Tobacco Use  . Smoking status: Former Smoker    Years: 30.00    Types: Cigarettes, Cigars    Quit date: 12/15/1993    Years since quitting: 27.0  . Smokeless tobacco: Never Used  . Tobacco comment: occasional cigar  Vaping Use  . Vaping Use: Never used  Substance and Sexual Activity  . Alcohol use: Yes    Alcohol/week: 16.0 standard drinks    Types: 1  Cans of beer, 1 Shots of liquor, 14 Standard drinks or equivalent per week    Comment: drinks 2 martini's a night (2 shots in each)  . Drug use: No  . Sexual activity: Yes  Other Topics Concern  . Not on file  Social History Narrative   Tries to remain active.  Frequent golfer but claudication limits this.   Social Determinants of Health   Financial Resource Strain: Not on file  Food Insecurity: Not on file  Transportation Needs: Not on file  Physical Activity: Not on file  Stress: Not on file  Social Connections: Not on file  Intimate Partner Violence: Not on file   Family History  Problem Relation Age of Onset  . Coronary artery disease Mother        bypass surgery - deceased  . Heart disease Father        murmur, valve replacement - deceased  . Breast cancer Sister   . Diabetes Other        grandmother  . Diabetes Paternal Grandmother   .  Diabetes Paternal Aunt   . Colon cancer Neg Hx   . Colon polyps Neg Hx   . Esophageal cancer Neg Hx   . Rectal cancer Neg Hx   . Stomach cancer Neg Hx     Current Outpatient Medications  Medication Sig Dispense Refill  . amiodarone (PACERONE) 200 MG tablet Take 1 tablet (200 mg total) by mouth daily. 30 tablet 3  . amoxicillin (AMOXIL) 500 MG capsule Take 2,000 mg by mouth once.    [name] atorvastatin (LIPITOR) 80 MG tablet Take 80 mg by mouth daily.    [name] ENTRESTO 24-26 MG TAKE 1 TABLET BY MOUTH TWICE A DAY 60 tablet 0  . Evolocumab 140 MG/ML SOAJ Inject 140 mg into the skin every 14 (fourteen) days. 1st and 15th of each month    . furosemide (LASIX) 20 MG tablet Take 2 tablets (40 mg total) by mouth daily. 90 tablet 3  . glyBURIDE (DIABETA) 2.5 MG tablet Take 5 mg by mouth 2 (two) times daily with a meal.    . INVOKANA 100 MG TABS tablet Take 100 mg by mouth daily.    . nitroGLYCERIN (NITROSTAT) 0.4 MG SL tablet Place 1 tablet (0.4 mg total) under the tongue every 5 (five) minutes x 3 doses as needed for chest pain. 25 tablet 12  .  pantoprazole (PROTONIX) 40 MG tablet Take 1 tablet (40 mg total) by mouth daily. 30 tablet 11  . spironolactone (ALDACTONE) 25 MG tablet Take 1 tablet (25 mg total) by mouth daily. 30 tablet 3  . warfarin (COUMADIN) 5 MG tablet Take 1 to 1 and 1/2 tablets by mouth daily as prescribed by the coumadin clinic. 120 tablet 0   No current facility-administered medications for this visit.    There were no vitals filed for this visit. Wt Readings from Last 3 Encounters:  01/08/21 80.3 kg (177 lb)  01/06/21 79.8 kg (176 lb)  01/01/21 80 kg (176 lb 5.9 oz)   PHYSICAL EXAM: General:  NAD. No resp difficulty HEENT: Normal Neck: Supple. No JVD. Carotids 2+ bilat; no bruits. No lymphadenopathy or thryomegaly appreciated. Cor: PMI nondisplaced. Regular rate & rhythm. No rubs, gallops, I/VI SEM Lungs: Clear Abdomen: Soft, nontender, nondistended. No hepatosplenomegaly. No bruits or masses. Good bowel sounds. Extremities: No cyanosis, clubbing, rash, edema Neuro: alert & oriented x 3, cranial nerves grossly intact. Moves all 4 extremities w/o difficulty. Affect pleasant.  ASSESSMENT & PLAN: 1. Chronic Systolic CHF: Ischemic cardiomyopathy. Echo 10/06/19 showed EF 30-35% and wall motion abnormalities, prior echo earlier in 10/20 showed EF 50-55%. Cath 10/20 showed jeopardized ramus and LCx territory (99% distal left main, occluded LAD, patent LIMA-LAD). Concerned that ischemia in this territory triggered CHF, worsening of LV function.  Now s/p DES to left main, TEE in 11/20 after intervention showed EF higher at 40-45%.  Echo in 1/22 showed EF 60-65% with normal RV.  NYHA class II symptoms, not significantly volume overloaded. He is in atypical atrial flutter today.  - Continue Entresto 24/26 bid.  BMET today.   - Continue Lasix 40 mg daily.    - Continue spironolactone 25 mg daily with BMET 10 days.   2. CAD: Complicated disease, coronary angiography 10/20 showed patent SVG-RCA territory and patent  LIMA-LAD. The proximal LAD was occluded and there was 99% distal left main stenosis. This left the ramus and LCx in jeopardy. He was not a good candidate for protected PCI =>cannot place Impella with mechanical aortic valve and peripheral vascular disease  likely precludes a femoral IABP. It was decided to try to manage him medically. However, he returned with progressive chest pain episodes and NSTEMI, hs-TnI up to 935. S/p successful orbital atherectomy and stenting of the left main with a 4.0x15 mm Resolute Onyx DES 10/10/19.No chest pain.   - He has been on Plavix for > 1 year post-PCI, will stop Plavix today.  I will not start him on ASA 81 as he has had GI bleeding x 2 when ASA was added to warfarin in the past.  - Continue warfarin for mechanical valve.    - Continue atorvastatin 80 daily + Repatha.  Good LDL in 6/21.   3. Mechanical aortic valve: Stable on 1/22 echo. Crisp mechanical sounds on exam - I will not use ASA given GI bleeding on combination of ASA and warfarin.  - Continue warfarin.   4. NSVT: Noted 10/20 admission, on amiodarone.  5. H/o GI bleeding: Continue PPI for GI protection. 6. PAD: Patient with aorto-bifemoral bypass, h/o iliac stenting, h/o femoral endarterectomy, PCI to right SFA in 2017.  Angiography in 10/20 with 80% ISR in right SFA.  He had PCI to right SFA in 3/21, no claudication now.  - Repeat ABIs in 4/22.  7. Carotid/subclavian stenosis: Repeat carotid dopplers in 4/22.  8. DM2: Will discuss addition of SGLT2 inhibitor at next appt.   10. Atrial fibrillation/atypical flutter:  TEE-guided DCCV in 11/20, atrial fibrillation ablation in 12/21.  Admitted with CHF exacerbation in 1/22 in the setting of recurrence of atrial fibrillation.  He is in NSR today.  - Continue amiodarone to 200 mg daily. LFTs/TSH (1/22) ok.  He will need a regular eye exam.   - Continue coumadin. - Hopefully, he will remain in NSR, and as we get some time out from ablation, can get him  off amiodarone.   Follow up 3 months w/ Dr. Aundra Dubin.  Maricela Bo [facility] FNP-BC 01/12/2021

## 2021-01-13 ENCOUNTER — Encounter (HOSPITAL_COMMUNITY): Payer: Medicare Other

## 2021-01-14 ENCOUNTER — Other Ambulatory Visit (HOSPITAL_COMMUNITY): Payer: Medicare Other

## 2021-01-16 NOTE — Progress Notes (Addendum)
ADVANCED HF CLINIC NOTE  PCP: Shon Baton, MD HF Cardiology: Dr. Velora Heckler a 75 y.o.malewith a history of CAD s/p CABG, mechanical AVR on coumadin, extensive PAD, DM, HTN, HLD, ITP, TIA, and recurrent GI bleed. Note, requires lovenox bridging off heparin.  Dr. Gwenlyn Found follows him for extensive PAD. He has had an aortobifem bypass in 2016 with follow up iliac stenting and femoral endarterectomy. In 2017, he had subsequent ostial and mid right SFA intervention. Hx of left renal artery stenting in 2016 with repeat intervention on left for 75% in-stent restenosis, with progression of disease on the right renal artery showing 60% stenosis on duplex 07/12/19. Echo 09/17/19 with normal EF of 60-65% and normal AVR function; chronic diastolic heart failure.   Admitted in 10/20 with chest pain. He underwent heart cath 09/18/19 that showed 99% left main stenosis and patent LIMA to occluded LAD and a patent sequential vein to the PDA and PLA of an occluded dominant right. His left main stenosis jeopardized the moderately large ramus and high first marginal and nondominant Cx which has 90% mid AV groove Cx stenosis. Aggressive medical therapy was recommended initially.  He was readmitted in 10/20 with CHF and chest pain. Hospital course complicated by A flutter/low output heart failure.  S/P successful orbital atherectomy, PTCA, and stenting of protected left main with a 4.0x15 mm resolute onyx DES 11/3. LIMA-LAD patent. SVG-PDA patent. ECHO this admission showed EF 30-35%, mechanical AoV ok. TEE 11/9 w/ improved EF, 40-45%. Placed on milrinone initially with low co-ox and later weaned off. He was cardioverted from atrial flutter back to NSR.   2/21 peripheral arterial dopplers showed 75-99% stenosis right SFA.  In 3/21, he had stenting to right SFA.    Echo in 6/21 showed EF up to 55-60%.    In 12/21, he had an atrial fibrillation ablation.   He was admitted in 1/22 with  shortness of breath and was found to be in atrial fibrillation with RVR. He was hypertensive.  Troponin was negative. He was diuresed and discharged, cardiology was not notified.  Echo during this admission showed EF 60-65%, normal RV size and systolic function, stable mechanical aortic valve.   During the 1/22 admission, CT chest showed a lung nodule.  Followup PET-CT showed probable early lung cancer.   He return 1/22 for CHF hospital follow up.  He was still in atypical atrial flutter. Lasix was increased at recent admission, he says that breathing is back to normal.  Weight up 5 lbs compared. INR has been therapeutic for > 1 month. Arlyce Harman was restarted, amiodarone was increased to 200 mg daily, and he was set up for DCCV.    Today he returns for HF follow up. Overall feeling fine. Denies increasing SOB, CP, dizziness, edema, or PND/Orthopnea. Appetite ok. No fever or chills. Weight at home ~167-168 pounds. Taking all medications.    ECG (personally reviewed): SR 65 bpm, new right axis    Labs (11/20): K 4.3, creatinine 1.3, hgb 12.5 Labs (12/20): LDL 56, HDL 80, TGs 179 Labs (1/21): K 4.8, creatinine 1.45 Labs (4/21): K 5.2, creatinine 1.34 Labs (6/21): LDL 44 Labs (1/22): K 4, creatinine 1.3  PMH  1. CAD:  S/P CABG (3/04) with LIMA-LAD, seq SVG-PDA/PLV.  - NSTEMI 10/20 with cath showing patent LIMA-LAD and SVG-PDA/PLV but 99% LM stenosis, occluded LAD, moderate ramus and high OM1 jeopardized, also 90% mid AV groove LCx stenosis.  Occluded RCA.  Patient ultimately had orbital  atherectomy and stenting of left main with a 4.0 x 15 mm Resolute Onyx DES.  2. Mechanical aortic valve: TEE 11/20 showed stable-appearing mechanical valve, mean gradient 9 mmHg.  3. PAD: Aortobifemoral bypass in 2016 with follow up iliac stenting and femoral endarterectomy. In 2017, he had subsequent ostial and mid right SFA intervention. - Angiography 10/20 with 80% in-stent restenosis in proximal right SFA.  -  Peripheral arterial dopplers (2/21) with 75-99% R SFA stenosis in prior stented area.  - 3/21 Right SFA stent (Dr. Gwenlyn Found).  4. Type 2 diabetes.  5. HTN - Renal artery dopplers (8/21): No significant stenosis.  6. Hyperlipidemia 7. H/o ITP  8. TIA  9. Chronic Systolic HF: Ischemic cardiomyopathy.  - Echo (10/20): EF 30-35%, mechanical AoV ok.  - TEE (11/20): w/ improved EF, 40-45%. Mechanical aortic valve with mean gradient 9 mmHg, normal RV.  - Echo (6/21): EF 55-60%, mild LVH, mechanical AoV with mean gradient 9 mmHg.  - Echo (1/22): EF 60-65%, normal RV size and systolic function, stable mechanical aortic valve.  10. Atrial fibrillation: Paroxysmal.  - Atrial fibrillation ablation 12/21.  - DCCV (1/22): successful return to NSR. 11. Renal artery stenosis: Hx of left renal artery stenting in 2016 with repeat intervention on left for 75% in-stent restenosis, with progression of disease on the right renal artery showing 60% stenosis on duplex 07/12/19. 12. Carotid stenosis: 4/21 carotid dopplers with right subclavian stenosis, 40-59% RICA stenosis.  13. Suspected lung cancer: PET scan 1/22 suggestive of early lung cancer.   ROS: All systems negative except as listed in HPI, PMH and Problem List.  Social History   Socioeconomic History  . Marital status: Divorced    Spouse name: Not on file  . Number of children: 3  . Years of education: Not on file  . Highest education level: Not on file  Occupational History  . Occupation: Retired  Tobacco Use  . Smoking status: Former Smoker    Years: 30.00    Types: Cigarettes, Cigars    Quit date: 12/15/1993    Years since quitting: 27.1  . Smokeless tobacco: Never Used  . Tobacco comment: occasional cigar  Vaping Use  . Vaping Use: Never used  Substance and Sexual Activity  . Alcohol use: Yes    Alcohol/week: 16.0 standard drinks    Types: 1 Cans of beer, 1 Shots of liquor, 14 Standard drinks or equivalent per week    Comment: drinks 2  martini's a night (2 shots in each)  . Drug use: No  . Sexual activity: Yes  Other Topics Concern  . Not on file  Social History Narrative   Tries to remain active.  Frequent golfer but claudication limits this.   Social Determinants of Health   Financial Resource Strain: Not on file  Food Insecurity: Not on file  Transportation Needs: Not on file  Physical Activity: Not on file  Stress: Not on file  Social Connections: Not on file  Intimate Partner Violence: Not on file   Family History  Problem Relation Age of Onset  . Coronary artery disease Mother        bypass surgery - deceased  . Heart disease Father        murmur, valve replacement - deceased  . Breast cancer Sister   . Diabetes Other        grandmother  . Diabetes Paternal Grandmother   . Diabetes Paternal Aunt   . Colon cancer Neg Hx   . Colon  polyps Neg Hx   . Esophageal cancer Neg Hx   . Rectal cancer Neg Hx   . Stomach cancer Neg Hx     Current Outpatient Medications  Medication Sig Dispense Refill  . amiodarone (PACERONE) 200 MG tablet Take 1 tablet (200 mg total) by mouth daily. 30 tablet 3  . amoxicillin (AMOXIL) 500 MG capsule Take 2,000 mg by mouth once.    [name] atorvastatin (LIPITOR) 80 MG tablet Take 80 mg by mouth daily.    [name] ENTRESTO 24-26 MG TAKE 1 TABLET BY MOUTH TWICE A DAY 60 tablet 0  . Evolocumab 140 MG/ML SOAJ Inject 140 mg into the skin every 14 (fourteen) days. 1st and 15th of each month    . furosemide (LASIX) 20 MG tablet Take 2 tablets (40 mg total) by mouth daily. 90 tablet 3  . glyBURIDE (DIABETA) 2.5 MG tablet Take 5 mg by mouth 2 (two) times daily with a meal.    . INVOKANA 100 MG TABS tablet Take 100 mg by mouth daily.    . nitroGLYCERIN (NITROSTAT) 0.4 MG SL tablet Place 1 tablet (0.4 mg total) under the tongue every 5 (five) minutes x 3 doses as needed for chest pain. 25 tablet 12  . pantoprazole (PROTONIX) 40 MG tablet Take 1 tablet (40 mg total) by mouth daily. 30 tablet 11  .  spironolactone (ALDACTONE) 25 MG tablet Take 1 tablet (25 mg total) by mouth daily. 30 tablet 3  . warfarin (COUMADIN) 5 MG tablet Take 1 to 1 and 1/2 tablets by mouth daily as prescribed by the coumadin clinic. 120 tablet 0   No current facility-administered medications for this encounter.    Vitals:   01/17/21 0935  BP: 126/83  Pulse: 62  SpO2: 95%  Weight: 78.1 kg (172 lb 3.2 oz)   Wt Readings from Last 3 Encounters:  01/17/21 78.1 kg (172 lb 3.2 oz)  01/08/21 80.3 kg (177 lb)  01/06/21 79.8 kg (176 lb)   PHYSICAL EXAM: General:  NAD. No resp difficulty HEENT: Normal Neck: Supple. No JVD. Carotids 2+ bilat; no bruits. No lymphadenopathy or thryomegaly appreciated. Cor: PMI nondisplaced. Regular rate & rhythm. No rubs, gallops, I/VI SEM, loud and clear mechanical sounds Lungs: Clear Abdomen: Soft, nontender, nondistended. No hepatosplenomegaly. No bruits or masses. Good bowel sounds. Extremities: No cyanosis, clubbing, rash, edema Neuro: alert & oriented x 3, cranial nerves grossly intact. Moves all 4 extremities w/o difficulty. Affect pleasant.  ASSESSMENT & PLAN: 1. Chronic Systolic CHF: Ischemic cardiomyopathy. Echo 10/06/19 showed EF 30-35% and wall motion abnormalities, prior echo earlier in 10/20 showed EF 50-55%. Cath 10/20 showed jeopardized ramus and LCx territory (99% distal left main, occluded LAD, patent LIMA-LAD). Concerned that ischemia in this territory triggered CHF, worsening of LV function.  Now s/p DES to left main, TEE in 11/20 after intervention showed EF higher at 40-45%.  Echo in 1/22 showed EF 60-65% with normal RV.  NYHA class II symptoms, euvolemic today. Weights stable. He is NSR today. - Continue Entresto 24/26 mg bid.    - Continue Lasix 40 mg daily.    - Continue spironolactone 25 mg daily. BMET today 2. CAD: Complicated disease, coronary angiography 10/20 showed patent SVG-RCA territory and patent LIMA-LAD. The proximal LAD was occluded and there  was 99% distal left main stenosis. This left the ramus and LCx in jeopardy. He was not a good candidate for protected PCI =>cannot place Impella with mechanical aortic valve and peripheral vascular disease likely precludes  a femoral IABP. It was decided to try to manage him medically. However, he returned with progressive chest pain episodes and NSTEMI, hs-TnI up to 935. S/p successful orbital atherectomy and stenting of the left main with a 4.0x15 mm Resolute Onyx DES 10/10/19.No chest pain.   - Will not start him on ASA 81 as he has had GI bleeding x 2 when ASA was added to warfarin in the past.  - Continue warfarin for mechanical valve.    - Continue atorvastatin 80 daily + Repatha.  Good LDL in 6/21.   3. Mechanical aortic valve: Stable on 1/22 echo. Loud & clear mechanical sounds on exam - Will not use ASA given GI bleeding on combination of ASA and warfarin.  - Continue warfarin.  INRs checked at [facility] [address]. 4. NSVT: Noted 10/20 admission, on amiodarone.  5. H/o GI bleeding: Continue PPI for GI protection. 6. PAD: Patient with aorto-bifemoral bypass, h/o iliac stenting, h/o femoral endarterectomy, PCI to right SFA in 2017.  Angiography in 10/20 with 80% ISR in right SFA.  He had PCI to right SFA in 3/21, no claudication now.  - Repeat ABIs in 4/22.  7. Carotid/subclavian stenosis: Repeat carotid dopplers in 4/22. These have been ordered.  8. DM2: He is on Invokana 100 mg daily 9. Atrial fibrillation/atypical flutter:  TEE-guided DCCV in 11/20, atrial fibrillation ablation in 12/21.  Admitted with CHF exacerbation in 1/22 in the setting of recurrence of atrial fibrillation.  He is in NSR today.  - Continue amiodarone to 200 mg daily. LFTs/TSH (1/22) ok.  He will need a regular eye exam.   - Continue coumadin. - Hopefully, he will remain in NSR, and as we get some time out from ablation, can get him off amiodarone.  - He was not overly symptomatic with his atrial fibrillation,  encouraged him to keep track of daily weights as this may be the best way to catch recurrence of arrhythmia.  Follow up 3 months w/ Dr. Aundra Dubin.  Maricela Bo [facility] FNP-BC 01/17/2021

## 2021-01-17 ENCOUNTER — Other Ambulatory Visit: Payer: Self-pay

## 2021-01-17 ENCOUNTER — Encounter (HOSPITAL_COMMUNITY): Payer: Self-pay

## 2021-01-17 ENCOUNTER — Ambulatory Visit (HOSPITAL_COMMUNITY)
Admission: RE | Admit: 2021-01-17 | Discharge: 2021-01-17 | Disposition: A | Discharge status: Home / Self Care | Payer: Medicare Other | Source: Ambulatory Visit | Attending: Family Medicine | Admitting: Family Medicine

## 2021-01-17 VITALS — BP 126/83 | HR 62 | Wt 172.2 lb

## 2021-01-17 DIAGNOSIS — I2510 Atherosclerotic heart disease of native coronary artery without angina pectoris: Secondary | ICD-10-CM | POA: Insufficient documentation

## 2021-01-17 DIAGNOSIS — Z952 Presence of prosthetic heart valve: Secondary | ICD-10-CM | POA: Diagnosis not present

## 2021-01-17 DIAGNOSIS — Z87891 Personal history of nicotine dependence: Secondary | ICD-10-CM | POA: Insufficient documentation

## 2021-01-17 DIAGNOSIS — I5022 Chronic systolic (congestive) heart failure: Secondary | ICD-10-CM | POA: Diagnosis not present

## 2021-01-17 DIAGNOSIS — I480 Paroxysmal atrial fibrillation: Secondary | ICD-10-CM

## 2021-01-17 DIAGNOSIS — I739 Peripheral vascular disease, unspecified: Secondary | ICD-10-CM

## 2021-01-17 DIAGNOSIS — E785 Hyperlipidemia, unspecified: Secondary | ICD-10-CM | POA: Insufficient documentation

## 2021-01-17 DIAGNOSIS — I252 Old myocardial infarction: Secondary | ICD-10-CM | POA: Insufficient documentation

## 2021-01-17 DIAGNOSIS — I25810 Atherosclerosis of coronary artery bypass graft(s) without angina pectoris: Secondary | ICD-10-CM

## 2021-01-17 DIAGNOSIS — Z7901 Long term (current) use of anticoagulants: Secondary | ICD-10-CM | POA: Insufficient documentation

## 2021-01-17 DIAGNOSIS — Z7984 Long term (current) use of oral hypoglycemic drugs: Secondary | ICD-10-CM | POA: Diagnosis not present

## 2021-01-17 DIAGNOSIS — Z79899 Other long term (current) drug therapy: Secondary | ICD-10-CM | POA: Insufficient documentation

## 2021-01-17 DIAGNOSIS — I6529 Occlusion and stenosis of unspecified carotid artery: Secondary | ICD-10-CM | POA: Diagnosis not present

## 2021-01-17 DIAGNOSIS — Z8719 Personal history of other diseases of the digestive system: Secondary | ICD-10-CM

## 2021-01-17 DIAGNOSIS — Z951 Presence of aortocoronary bypass graft: Secondary | ICD-10-CM | POA: Insufficient documentation

## 2021-01-17 DIAGNOSIS — I110 Hypertensive heart disease with heart failure: Secondary | ICD-10-CM | POA: Diagnosis not present

## 2021-01-17 DIAGNOSIS — Z955 Presence of coronary angioplasty implant and graft: Secondary | ICD-10-CM | POA: Insufficient documentation

## 2021-01-17 DIAGNOSIS — I491 Atrial premature depolarization: Secondary | ICD-10-CM | POA: Diagnosis not present

## 2021-01-17 DIAGNOSIS — I255 Ischemic cardiomyopathy: Secondary | ICD-10-CM | POA: Insufficient documentation

## 2021-01-17 DIAGNOSIS — E1159 Type 2 diabetes mellitus with other circulatory complications: Secondary | ICD-10-CM

## 2021-01-17 DIAGNOSIS — I4729 Other ventricular tachycardia: Secondary | ICD-10-CM

## 2021-01-17 DIAGNOSIS — I472 Ventricular tachycardia: Secondary | ICD-10-CM

## 2021-01-17 DIAGNOSIS — E1151 Type 2 diabetes mellitus with diabetic peripheral angiopathy without gangrene: Secondary | ICD-10-CM | POA: Insufficient documentation

## 2021-01-17 NOTE — Addendum Note (Signed)
Encounter addended by: Rafael Bihari, FNP on: 01/17/2021 9:33 PM  Actions taken: Clinical Note Signed

## 2021-01-17 NOTE — Addendum Note (Signed)
Encounter addended by: Rafael Bihari, FNP on: 01/17/2021 9:39 PM  Actions taken: Clinical Note Signed

## 2021-01-17 NOTE — Patient Instructions (Signed)
It was great to see you today! No medication changes are needed at this time.  Your physician recommends that you schedule a follow-up appointment in: 3 - 4 months with Dr. Aundra Dubin  Do the following things EVERYDAY: 1) Weigh yourself in the morning before breakfast. Write it down and keep it in a log. 2) Take your medicines as prescribed 3) Eat low salt foods-Limit salt (sodium) to 2000 mg per day.  4) Stay as active as you can everyday 5) Limit all fluids for the day to less than 2 liters  At the [facility], you and your health needs are our priority. As part of our continuing mission to provide you with exceptional heart care, we have created designated Provider Care Teams. These Care Teams include your primary Cardiologist (physician) and Advanced Practice Providers (APPs- Physician Assistants and Nurse Practitioners) who all work together to provide you with the care you need, when you need it.   You may see any of the following providers on your designated Care Team at your next follow up: [name] Dr Glori Bickers . Dr Loralie Champagne . Darrick Grinder, NP . Lyda Jester, [place] . Audry Riles, PharmD   Please be sure to bring in all your medications bottles to every appointment.    If you have any questions or concerns before your next appointment please send [place] a message through [place] or call our office at [phone].    TO LEAVE A MESSAGE FOR THE NURSE SELECT OPTION 2, PLEASE LEAVE A MESSAGE INCLUDING: . YOUR NAME . DATE OF BIRTH . CALL BACK NUMBER . REASON FOR CALL**this is important as we prioritize the call backs  YOU WILL RECEIVE A CALL BACK THE SAME DAY AS LONG AS YOU CALL BEFORE 4:00 PM

## 2021-01-21 ENCOUNTER — Other Ambulatory Visit: Payer: Self-pay

## 2021-01-21 ENCOUNTER — Ambulatory Visit (INDEPENDENT_AMBULATORY_CARE_PROVIDER_SITE_OTHER): Payer: Medicare Other

## 2021-01-21 DIAGNOSIS — Z5181 Encounter for therapeutic drug level monitoring: Secondary | ICD-10-CM

## 2021-01-21 DIAGNOSIS — I359 Nonrheumatic aortic valve disorder, unspecified: Secondary | ICD-10-CM

## 2021-01-21 DIAGNOSIS — G459 Transient cerebral ischemic attack, unspecified: Secondary | ICD-10-CM

## 2021-01-21 LAB — POCT INR: INR: 2.3 (ref 2.0–3.0)

## 2021-01-21 MED ORDER — ENOXAPARIN SODIUM 120 MG/0.8ML ~~LOC~~ SOLN: 120.0000 mg | SUBCUTANEOUS | 0 refills | Status: DC

## 2021-01-21 NOTE — Patient Instructions (Addendum)
Description   Take an extra 1/2 tablet today, then resume same dosage 1 tablet daily except for 1.5 tablets on Sundays and Thursdays.  Take your last dosage of Warfarin on 01/22/21 prior to procedure on 01/28/21.  See Lovenox bridging instructions.  Recheck INR on 02/03/21. Amio increased from 100mg  daily to 200mg  daily on 12/25/2020. Coumadin Clinic for any changes in medications or upcoming procedures. [phone]    01/22/21: Last dose of warfarin.  01/23/21: No warfarin or enoxaparin (Lovenox).  01/24/21: Inject enoxaparin 120mg  in the fatty abdominal tissue at least 2 inches from the belly button once daily at 8pm rotate sites. No warfarin.  01/25/21: Inject enoxaparin in the fatty tissue once daily at 8pm rotate sites. No warfarin.  01/26/21: Inject enoxaparin in the fatty tissue once daily at 8pm rotate sites. No warfarin.  01/27/21: No warfarin or enoxaparin (Lovenox) prior to procedure on 01/28/21.  01/28/21: Procedure Day - No enoxaparin - Resume warfarin in the evening or as directed by doctor (take an extra half tablet with usual dose for 2 days then resume normal dose).  01/29/21: Resume enoxaparin inject in the fatty tissue once daily at 8am rotate sites and take warfarin.  01/30/21: Inject enoxaparin in the fatty tissue once daily at 8am rotate sites and take warfarin.  01/31/21: Inject enoxaparin in the fatty tissue once daily at 8am rotate sites and take warfarin.  02/01/21: Inject enoxaparin in the fatty tissue once daily at 8am rotate sites and take warfarin.  02/02/21: Inject enoxaparin in the fatty tissue once daily at 8am rotate sites and take warfarin.  02/03/21: warfarin appt to check INR.

## 2021-01-25 ENCOUNTER — Other Ambulatory Visit (HOSPITAL_COMMUNITY)
Admission: RE | Admit: 2021-01-25 | Discharge: 2021-01-25 | Disposition: A | Discharge status: Home / Self Care | Payer: Medicare Other | Source: Ambulatory Visit | Attending: Pulmonary Disease | Admitting: Pulmonary Disease

## 2021-01-25 DIAGNOSIS — Z20822 Contact with and (suspected) exposure to covid-19: Secondary | ICD-10-CM | POA: Insufficient documentation

## 2021-01-25 DIAGNOSIS — Z01812 Encounter for preprocedural laboratory examination: Secondary | ICD-10-CM | POA: Insufficient documentation

## 2021-01-25 LAB — SARS CORONAVIRUS 2 (TAT 6-24 HRS): SARS Coronavirus 2: NEGATIVE

## 2021-01-27 ENCOUNTER — Other Ambulatory Visit: Payer: Self-pay

## 2021-01-27 ENCOUNTER — Encounter (HOSPITAL_COMMUNITY): Payer: Self-pay | Admitting: Pulmonary Disease

## 2021-01-27 NOTE — Progress Notes (Signed)
Anesthesia Chart Review: Same day workup  Pertinent hx includes CAD s/p CABG, mechanical AVR on coumadin, extensive PAD, DM, HTN, HLD, ITP, TIA, COPD, and recurrent GI bleed.   Dr. Gwenlyn Found follows him for extensive PAD. He has had an aortobifem bypass in 2016 with follow up iliac stenting and femoral endarterectomy. In 2017, he had subsequent ostial and mid right SFA intervention. Hx of left renal artery stenting in 2016 with repeat intervention on left for 75% in-stent restenosis, with progression of disease on the right renal artery showing 60% stenosis on duplex 07/12/19. 2/21 peripheral arterial dopplers showed 75-99% stenosis right SFA.  In 3/21, he had stenting to right SFA.    Admitted 10/20 with CHF and chest pain. Hospital course complicated by A flutter/low output heart failure.  S/P successful orbital atherectomy, PTCA, and stenting of protected left main with a 4.0x15 mm resolute onyx DES 11/3. LIMA-LAD patent. SVG-PDA patent. ECHO showed EF 30-35%, mechanical AoV ok.TEE 11/9 w/ improved EF, 40-45%. Placed on milrinone initially with low co-ox and later weaned off. He was cardioverted from atrial flutter back to NSR. Echo in 6/21 showed EF up to 55-60%.    In 12/21, he had an atrial fibrillation ablation.  He was subsequently admitted in 1/22 with shortness of breath and was found to be in atrial fibrillation with RVR. He was hypertensive.  Troponin was negative. He was diuresed and discharged, cardiology was not notified.  Echo during this admission showed EF 60-65%, normal RV size and systolic function, stable mechanical aortic valve. During the 1/22 admission, CT chest showed a lung nodule.  Followup PET-CT showed probable early lung cancer.   Seen by cardiology outpatient 1/22 for CHF hospital follow up and he was set up for DCCV which he underwent 01/01/21. Last seen in afib clinic 01/17/21 and noted to be in sinus rhythm.   He is on Lovenox bridge in preparation for bronchoscopy.   He  will need DOS labs and eval.   EKG 01/17/21: Sinus rhythm. Rate 65. New right axis.   CTA Chest 12/08/20: IMPRESSION: 1. No pulmonary embolism. 2. Irregular 1.5 cm lingular solid pulmonary nodule, increased since 09/16/2019 CT, cannot exclude primary bronchogenic carcinoma. PET-CT suggested for further evaluation. 3. Moderate centrilobular emphysema with diffuse bronchial wall thickening and saber sheath trachea, suggesting COPD. 4. Contrast reflux into the IVC and hepatic veins, suggesting elevated central venous pressures/right heart failure. 5. Aortic Atherosclerosis (ICD10-I70.0) and Emphysema (ICD10-J43.9).  TTE 12/08/20: 1. Normal LV systolic function; s/p AVR with normal mean gradient (8  mmHg) and no AI.  2. Left ventricular ejection fraction, by estimation, is 60 to 65%. The  left ventricle has normal function. The left ventricle has no regional  wall motion abnormalities. There is moderate left ventricular hypertrophy.  Left ventricular diastolic  parameters are indeterminate. Elevated left atrial pressure.  3. Right ventricular systolic function is normal. The right ventricular  size is normal.  4. Left atrial size was moderately dilated.  5. The mitral valve is normal in structure. No evidence of mitral valve  regurgitation. No evidence of mitral stenosis.  6. The aortic valve has been repaired/replaced. Aortic valve  regurgitation is not visualized. No aortic stenosis is present. There is a  mechanical valve present in the aortic position.  7. The inferior vena cava is normal in size with greater than 50%  respiratory variability, suggesting right atrial pressure of 3 mmHg.     Karoline Caldwell, PA-C [facility]/Anesthesiology Phone [phone]  01/27/2021 12:18 PM

## 2021-01-27 NOTE — Progress Notes (Signed)
Patient denies shortness of breath, fever, cough or chest pain.  PCP - Dr Shon Baton Cardiologist - Dr Esaw Dace - Dr Lucio Edward  Chest x-ray - CT Chest 12/08/20; Xray 12/08/20 (1V) EKG - 01/17/21 Stress Test - 09/17/19 ECHO - 12/08/20 Cardiac Cath - 10/10/19  Fasting Blood Sugar - unknown Checks Blood Sugar- does not check blood sugar  . Do not take glyburide tonight (Mon) or tomorrow  (Tues - morning of surgery).  Blood Thinner Instructions: Last dose of Lovenox was on 01/26/21 and last dose of coumadin was on 01/21/21 per patient.  Anesthesia review: Yes  STOP now taking any Aspirin (unless otherwise instructed by your surgeon), Aleve, Naproxen, Ibuprofen, Motrin, Advil, Goody's, BC's, all herbal medications, fish oil, and all vitamins.   Coronavirus Screening Covid test on 01/25/21 was negative.  Patient verbalized understanding of instructions that were given via phone.

## 2021-01-27 NOTE — Anesthesia Preprocedure Evaluation (Addendum)
Anesthesia Evaluation  Patient identified by MRN, date of birth, ID band Patient awake    Reviewed: Allergy & Precautions, NPO status , Patient's Chart, lab work & pertinent test results  Airway Mallampati: III  TM Distance: >3 FB Neck ROM: Full    Dental  (+) Teeth Intact, Loose, Dental Advisory Given,    Pulmonary COPD, former smoker,    Pulmonary exam normal        Cardiovascular hypertension, Pt. on medications + CAD, + CABG, + Peripheral Vascular Disease and +CHF  + dysrhythmias + Valvular Problems/Murmurs AS  Rhythm:Regular Rate:Normal + Systolic Click - s/p AVR   Neuro/Psych TIACVA negative psych ROS   GI/Hepatic Neg liver ROS, GERD  Medicated,  Endo/Other  diabetes, Type 2, Oral Hypoglycemic Agents  Renal/GU Renal disease     Musculoskeletal   Abdominal Normal abdominal exam  (+)   Peds  Hematology   Anesthesia Other Findings - HLD  Reproductive/Obstetrics                           Anesthesia Physical Anesthesia Plan  ASA: III  Anesthesia Plan: General   Post-op Pain Management:    Induction: Intravenous  PONV Risk Score and Plan: 2 and Ondansetron and Midazolam  Airway Management Planned: Oral ETT  Additional Equipment: None  Intra-op Plan:   Post-operative Plan: Extubation in OR  Informed Consent: I have reviewed the patients History and Physical, chart, labs and discussed the procedure including the risks, benefits and alternatives for the proposed anesthesia with the patient or authorized representative who has indicated his/her understanding and acceptance.     Dental advisory given  Plan Discussed with: CRNA  Anesthesia Plan Comments: (PAT note by Karoline Caldwell, PA-C: Pertinent hx includes CAD s/p CABG, mechanical AVR on coumadin, extensive PAD, DM, HTN, HLD, ITP, TIA, COPD, and recurrent GI bleed.   Dr. Gwenlyn Found follows him for extensive PAD. He has had an  aortobifem bypass in 2016 with follow up iliac stenting and femoral endarterectomy. In 2017, he had subsequent ostial and mid right SFA intervention. Hx of left renal artery stenting in 2016 with repeat intervention on left for 75% in-stent restenosis, with progression of disease on the right renal artery showing 60% stenosis on duplex 07/12/19. 2/21 peripheral arterial dopplers showed 75-99% stenosis right SFA.  In 3/21, he had stenting to right SFA.    Admitted 10/20 with CHF and chest pain. Hospital course complicated by A flutter/low output heart failure.  S/P successful orbital atherectomy, PTCA, and stenting of protected left main with a 4.0x15 mm resolute onyx DES 11/3. LIMA-LAD patent. SVG-PDA patent. ECHO showed EF 30-35%, mechanical AoV ok.TEE 11/9 w/ improved EF, 40-45%. Placed on milrinone initially with low co-ox and later weaned off. He was cardioverted from atrial flutter back to NSR. Echo in 6/21 showed EF up to 55-60%.    In 12/21, he had an atrial fibrillation ablation.  He was subsequently admitted in 1/22 with shortness of breath and was found to be in atrial fibrillation with RVR. He was hypertensive.  Troponin was negative. He was diuresed and discharged, cardiology was not notified.  Echo during this admission showed EF 60-65%, normal RV size and systolic function, stable mechanical aortic valve. During the 1/22 admission, CT chest showed a lung nodule.  Followup PET-CT showed probable early lung cancer.   Seen by cardiology outpatient 1/22 for CHF hospital follow up and he was set up for DCCV which he  underwent 01/01/21. Last seen in afib clinic 01/17/21 and noted to be in sinus rhythm.   He is on Lovenox bridge in preparation for bronchoscopy.   He will need DOS labs and eval.   EKG 01/17/21: Sinus rhythm. Rate 65. New right axis.   CTA Chest 12/08/20: IMPRESSION: 1. No pulmonary embolism. 2. Irregular 1.5 cm lingular solid pulmonary nodule, increased since 09/16/2019 CT,  cannot exclude primary bronchogenic carcinoma. PET-CT suggested for further evaluation. 3. Moderate centrilobular emphysema with diffuse bronchial wall thickening and saber sheath trachea, suggesting COPD. 4. Contrast reflux into the IVC and hepatic veins, suggesting elevated central venous pressures/right heart failure. 5. Aortic Atherosclerosis (ICD10-I70.0) and Emphysema (ICD10-J43.9).  TTE 12/08/20: 1. Normal LV systolic function; s/p AVR with normal mean gradient (8  mmHg) and no AI.  2. Left ventricular ejection fraction, by estimation, is 60 to 65%. The  left ventricle has normal function. The left ventricle has no regional  wall motion abnormalities. There is moderate left ventricular hypertrophy.  Left ventricular diastolic  parameters are indeterminate. Elevated left atrial pressure.  3. Right ventricular systolic function is normal. The right ventricular  size is normal.  4. Left atrial size was moderately dilated.  5. The mitral valve is normal in structure. No evidence of mitral valve  regurgitation. No evidence of mitral stenosis.  6. The aortic valve has been repaired/replaced. Aortic valve  regurgitation is not visualized. No aortic stenosis is present. There is a  mechanical valve present in the aortic position.  7. The inferior vena cava is normal in size with greater than 50%  respiratory variability, suggesting right atrial pressure of 3 mmHg.   )      Anesthesia Quick Evaluation

## 2021-01-28 ENCOUNTER — Ambulatory Visit (HOSPITAL_COMMUNITY): Payer: Medicare Other

## 2021-01-28 ENCOUNTER — Ambulatory Visit (HOSPITAL_COMMUNITY): Payer: Medicare Other | Admitting: Physician Assistant

## 2021-01-28 ENCOUNTER — Encounter (HOSPITAL_COMMUNITY)
Admission: RE | Disposition: A | Discharge status: Home / Self Care | Payer: Self-pay | Source: Home / Self Care | Attending: Pulmonary Disease

## 2021-01-28 ENCOUNTER — Other Ambulatory Visit: Payer: Self-pay

## 2021-01-28 ENCOUNTER — Encounter (HOSPITAL_COMMUNITY): Payer: Self-pay | Admitting: Pulmonary Disease

## 2021-01-28 ENCOUNTER — Ambulatory Visit (HOSPITAL_COMMUNITY)
Admission: RE | Admit: 2021-01-28 | Discharge: 2021-01-28 | Disposition: A | Discharge status: Home / Self Care | Payer: Medicare Other | Attending: Pulmonary Disease | Admitting: Pulmonary Disease

## 2021-01-28 DIAGNOSIS — Z8673 Personal history of transient ischemic attack (TIA), and cerebral infarction without residual deficits: Secondary | ICD-10-CM | POA: Diagnosis not present

## 2021-01-28 DIAGNOSIS — E7800 Pure hypercholesterolemia, unspecified: Secondary | ICD-10-CM | POA: Diagnosis not present

## 2021-01-28 DIAGNOSIS — Z8249 Family history of ischemic heart disease and other diseases of the circulatory system: Secondary | ICD-10-CM | POA: Diagnosis not present

## 2021-01-28 DIAGNOSIS — I4891 Unspecified atrial fibrillation: Secondary | ICD-10-CM | POA: Diagnosis not present

## 2021-01-28 DIAGNOSIS — E119 Type 2 diabetes mellitus without complications: Secondary | ICD-10-CM | POA: Diagnosis not present

## 2021-01-28 DIAGNOSIS — E785 Hyperlipidemia, unspecified: Secondary | ICD-10-CM | POA: Diagnosis not present

## 2021-01-28 DIAGNOSIS — R942 Abnormal results of pulmonary function studies: Secondary | ICD-10-CM | POA: Diagnosis not present

## 2021-01-28 DIAGNOSIS — I10 Essential (primary) hypertension: Secondary | ICD-10-CM | POA: Diagnosis not present

## 2021-01-28 DIAGNOSIS — I110 Hypertensive heart disease with heart failure: Secondary | ICD-10-CM | POA: Insufficient documentation

## 2021-01-28 DIAGNOSIS — I509 Heart failure, unspecified: Secondary | ICD-10-CM | POA: Diagnosis not present

## 2021-01-28 DIAGNOSIS — Z95828 Presence of other vascular implants and grafts: Secondary | ICD-10-CM | POA: Diagnosis not present

## 2021-01-28 DIAGNOSIS — Z9081 Acquired absence of spleen: Secondary | ICD-10-CM | POA: Insufficient documentation

## 2021-01-28 DIAGNOSIS — Z86010 Personal history of colonic polyps: Secondary | ICD-10-CM | POA: Insufficient documentation

## 2021-01-28 DIAGNOSIS — Z952 Presence of prosthetic heart valve: Secondary | ICD-10-CM | POA: Insufficient documentation

## 2021-01-28 DIAGNOSIS — I2510 Atherosclerotic heart disease of native coronary artery without angina pectoris: Secondary | ICD-10-CM | POA: Diagnosis not present

## 2021-01-28 DIAGNOSIS — Z951 Presence of aortocoronary bypass graft: Secondary | ICD-10-CM | POA: Insufficient documentation

## 2021-01-28 DIAGNOSIS — Z7901 Long term (current) use of anticoagulants: Secondary | ICD-10-CM | POA: Diagnosis not present

## 2021-01-28 DIAGNOSIS — Z79899 Other long term (current) drug therapy: Secondary | ICD-10-CM | POA: Diagnosis not present

## 2021-01-28 DIAGNOSIS — I739 Peripheral vascular disease, unspecified: Secondary | ICD-10-CM | POA: Insufficient documentation

## 2021-01-28 DIAGNOSIS — Z87891 Personal history of nicotine dependence: Secondary | ICD-10-CM | POA: Insufficient documentation

## 2021-01-28 DIAGNOSIS — R911 Solitary pulmonary nodule: Secondary | ICD-10-CM | POA: Insufficient documentation

## 2021-01-28 DIAGNOSIS — I499 Cardiac arrhythmia, unspecified: Secondary | ICD-10-CM | POA: Insufficient documentation

## 2021-01-28 DIAGNOSIS — Z9889 Other specified postprocedural states: Secondary | ICD-10-CM

## 2021-01-28 DIAGNOSIS — Z833 Family history of diabetes mellitus: Secondary | ICD-10-CM | POA: Insufficient documentation

## 2021-01-28 DIAGNOSIS — C3412 Malignant neoplasm of upper lobe, left bronchus or lung: Secondary | ICD-10-CM | POA: Diagnosis not present

## 2021-01-28 DIAGNOSIS — I701 Atherosclerosis of renal artery: Secondary | ICD-10-CM | POA: Diagnosis not present

## 2021-01-28 DIAGNOSIS — J432 Centrilobular emphysema: Secondary | ICD-10-CM | POA: Diagnosis not present

## 2021-01-28 HISTORY — PX: VIDEO BRONCHOSCOPY WITH ENDOBRONCHIAL NAVIGATION: SHX6175

## 2021-01-28 HISTORY — DX: Gastro-esophageal reflux disease without esophagitis: K21.9

## 2021-01-28 HISTORY — PX: BRONCHIAL WASHINGS: SHX5105

## 2021-01-28 HISTORY — PX: BRONCHIAL NEEDLE ASPIRATION BIOPSY: SHX5106

## 2021-01-28 HISTORY — PX: BRONCHIAL BRUSHINGS: SHX5108

## 2021-01-28 HISTORY — PX: BRONCHIAL BIOPSY: SHX5109

## 2021-01-28 HISTORY — PX: FIDUCIAL MARKER PLACEMENT: SHX6858

## 2021-01-28 LAB — BASIC METABOLIC PANEL
Anion gap: 10 (ref 5–15)
BUN: 21 mg/dL (ref 8–23)
CO2: 28 mmol/L (ref 22–32)
Calcium: 8.9 mg/dL (ref 8.9–10.3)
Chloride: 99 mmol/L (ref 98–111)
Creatinine, Ser: 1.1 mg/dL (ref 0.61–1.24)
GFR, Estimated: >60 mL/min (ref >60)
Glucose, Bld: 218 mg/dL — ABNORMAL HIGH (ref 70–99)
Potassium: 4 mmol/L (ref 3.5–5.1)
Sodium: 137 mmol/L (ref 135–145)

## 2021-01-28 LAB — GLUCOSE, CAPILLARY
Glucose-Capillary: 211 mg/dL — ABNORMAL HIGH (ref 70–99)
Glucose-Capillary: 241 mg/dL — ABNORMAL HIGH (ref 70–99)
Glucose-Capillary: 143 mg/dL — ABNORMAL HIGH (ref 70–99)

## 2021-01-28 LAB — PROTIME-INR
INR: 1 (ref 0.8–1.2)
Prothrombin Time: 12.3 seconds (ref 11.4–15.2)

## 2021-01-28 SURGERY — VIDEO BRONCHOSCOPY WITH ENDOBRONCHIAL NAVIGATION
Anesthesia: General

## 2021-01-28 MED ORDER — LACTATED RINGERS IV SOLN: INTRAVENOUS | Status: DC | PRN

## 2021-01-28 MED ORDER — FENTANYL CITRATE (PF) 100 MCG/2ML IJ SOLN
25.0000 ug | INTRAMUSCULAR | Status: DC | PRN
Start: 2021-01-28 — End: 2021-01-28

## 2021-01-28 MED ORDER — LIDOCAINE 2% (20 MG/ML) 5 ML SYRINGE
INTRAMUSCULAR | Status: DC | PRN
  Administered 2021-01-28: 40 mg via INTRAVENOUS

## 2021-01-28 MED ORDER — INSULIN ASPART 100 UNIT/ML ~~LOC~~ SOLN
5.0000 [IU] | Freq: Once | SUBCUTANEOUS | Status: AC
  Administered 2021-01-28: 5 [IU] via INTRAVENOUS

## 2021-01-28 MED ORDER — AMISULPRIDE (ANTIEMETIC) 5 MG/2ML IV SOLN: 10.0000 mg | Freq: Once | INTRAVENOUS | Status: DC | PRN

## 2021-01-28 MED ORDER — PROPOFOL 10 MG/ML IV BOLUS
INTRAVENOUS | Status: DC | PRN
  Administered 2021-01-28: 90 mg via INTRAVENOUS

## 2021-01-28 MED ORDER — ACETAMINOPHEN 10 MG/ML IV SOLN: 1000.0000 mg | Freq: Once | INTRAVENOUS | Status: DC | PRN

## 2021-01-28 MED ORDER — ACETAMINOPHEN 160 MG/5ML PO SOLN: 325.0000 mg | Freq: Once | ORAL | Status: DC | PRN

## 2021-01-28 MED ORDER — FENTANYL CITRATE (PF) 250 MCG/5ML IJ SOLN
INTRAMUSCULAR | Status: DC | PRN
  Administered 2021-01-28 (×2): 50 ug via INTRAVENOUS

## 2021-01-28 MED ORDER — CHLORHEXIDINE GLUCONATE 0.12 % MT SOLN
OROMUCOSAL | Status: AC
  Administered 2021-01-28: 15 mL
  Filled 2021-01-28: qty 15

## 2021-01-28 MED ORDER — ROCURONIUM BROMIDE 10 MG/ML (PF) SYRINGE
PREFILLED_SYRINGE | INTRAVENOUS | Status: DC | PRN
  Administered 2021-01-28: 50 mg via INTRAVENOUS

## 2021-01-28 MED ORDER — MEPERIDINE HCL 100 MG/ML IJ SOLN: 6.2500 mg | INTRAMUSCULAR | Status: DC | PRN

## 2021-01-28 MED ORDER — PHENYLEPHRINE HCL-NACL 10-0.9 MG/250ML-% IV SOLN
INTRAVENOUS | Status: DC | PRN
  Administered 2021-01-28: 30 ug/min via INTRAVENOUS

## 2021-01-28 MED ORDER — ACETAMINOPHEN 325 MG PO TABS: 325.0000 mg | ORAL_TABLET | Freq: Once | ORAL | Status: DC | PRN

## 2021-01-28 MED ORDER — LACTATED RINGERS IV SOLN: INTRAVENOUS | Status: DC

## 2021-01-28 MED ORDER — ONDANSETRON HCL 4 MG/2ML IJ SOLN
INTRAMUSCULAR | Status: DC | PRN
  Administered 2021-01-28: 4 mg via INTRAVENOUS

## 2021-01-28 MED ORDER — SUGAMMADEX SODIUM 200 MG/2ML IV SOLN
INTRAVENOUS | Status: DC | PRN
  Administered 2021-01-28: 200 mg via INTRAVENOUS

## 2021-01-28 MED ORDER — DEXAMETHASONE SODIUM PHOSPHATE 10 MG/ML IJ SOLN
INTRAMUSCULAR | Status: DC | PRN
  Administered 2021-01-28: 5 mg via INTRAVENOUS

## 2021-01-28 SURGICAL SUPPLY — 47 items

## 2021-01-28 NOTE — Op Note (Signed)
Video Bronchoscopy with Electromagnetic Navigation Procedure Note  Date of Operation: 01/28/2021  Pre-op Diagnosis: Lingular lung nodule  Post-op Diagnosis: Lingular lung nodule  Surgeon: Garner Nash, DO   Assistants: None  Anesthesia: General endotracheal anesthesia  Operation: Flexible video fiberoptic bronchoscopy with electromagnetic navigation and biopsies.  Estimated Blood Loss: Minimal  Complications: none  Indications and History: [name] [name] is a 75 y.o. male with Lingular lung nodule.  The risks, benefits, complications, treatment options and expected outcomes were discussed with the patient.  The possibilities of pneumothorax, pneumonia, reaction to medication, pulmonary aspiration, perforation of a viscus, bleeding, failure to diagnose a condition and creating a complication requiring transfusion or operation were discussed with the patient who freely signed the consent.    Description of Procedure: The patient was seen in the Preoperative Area, was examined and was deemed appropriate to proceed.  The patient was taken to [facility] endoscopy room 2, identified as [name] [name] and the procedure verified as Flexible Video Fiberoptic Bronchoscopy.  A Time Out was held and the above information confirmed.   Prior to the date of the procedure a high-resolution CT scan of the chest was performed. Utilizing [place] a virtual tracheobronchial tree was generated to allow the creation of distinct navigation pathways to the patient's parenchymal abnormalities. After being taken to the operating room general anesthesia was initiated and the patient  was orally intubated. The video fiberoptic bronchoscope was introduced via the endotracheal tube and a general inspection was performed which showed normal right and left lung anatomy with significant bronchiectatic distal openings, airway pitting and striations.   The extendable working channel and locator guide  were introduced into the bronchoscope. The distinct navigation pathways prepared prior to this procedure were then utilized to navigate to within 0.8 cm of patient's lesion(s) identified on CT scan.  Full fluoroscopic sweep with an inspiratory breath-hold APL 20 cm of water from RAO 20 degrees to LAO 25 degrees. The extendable working channel was secured into place and the locator guide was withdrawn. Under fluoroscopic guidance transbronchial needle brushings, transbronchial Wang needle biopsies, and transbronchial forceps biopsies were performed to be sent for cytology and pathology.  Following tissue sampling 3 fiducials were placed under fluoroscopic guidance and fiducial catheter and wire delivery kit.  All fiducials were placed within 2.5 cm of the patient's lesional center as defined by navigational bronchoscopy system.  A bronchioalveolar lavage was performed in the lingula and sent for cytology.  The standard therapeutic bronchoscope was used for bilateral aspiration of the mainstem's and removal of any blood clots and remaining debris from the airway.  At the end of the procedure the distal terminal airway segments were patent and there was no occlusion from any remaining blood clots. At the end of the procedure a general airway inspection was performed and there was no evidence of active bleeding. The bronchoscope was removed.  The patient tolerated the procedure well. There was no significant blood loss and there were no obvious complications. A post-procedural chest x-ray is pending.  Samples: 1. Transbronchial needle brushings from left upper lobe lingula 2. Transbronchial Wang needle biopsies from left upper lobe lingula  3. Transbronchial forceps biopsies from left upper lobe lingula 4. Bronchoalveolar lavage from left upper lobe lingula  Plans:  The patient will be discharged from the PACU to home when recovered from anesthesia and after chest x-ray is reviewed. We will review the cytology,  pathology and microbiology results with the patient when they become  available. Outpatient followup will be with Garner Nash, DO.    Garner Nash, DO [facility] 01/28/2021 8:46 AM

## 2021-01-28 NOTE — Transfer of Care (Signed)
Immediate Anesthesia Transfer of Care Note  Patient: [name] [name]  Procedure(s) Performed: VIDEO BRONCHOSCOPY WITH ENDOBRONCHIAL NAVIGATION (N/A ) BRONCHIAL BIOPSIES BRONCHIAL BRUSHINGS BRONCHIAL NEEDLE ASPIRATION BIOPSIES FIDUCIAL MARKER PLACEMENT BRONCHIAL WASHINGS  Patient Location: PACU  Anesthesia Type:General  Level of Consciousness: awake, alert  and oriented  Airway & Oxygen Therapy: Patient Spontanous Breathing  Post-op Assessment: Report given to RN and Post -op Vital signs reviewed and stable  Post vital signs: Reviewed and stable  Last Vitals:  Vitals Value Taken Time  BP 151/55 01/28/21 0836  Temp    Pulse 61 01/28/21 0837  Resp 16 01/28/21 0837  SpO2 91 % 01/28/21 0837  Vitals shown include unvalidated device data.  Last Pain:  Vitals:   01/28/21 0640  TempSrc:   PainSc: 0-No pain         Complications: No complications documented.

## 2021-01-28 NOTE — Discharge Instructions (Signed)
Flexible Bronchoscopy, Care After This sheet gives you information about how to care for yourself after your test. Your doctor may also give you more specific instructions. If you have problems or questions, contact your doctor. Follow these instructions at home: Eating and drinking  Do not eat or drink anything (not even water) for 2 hours after your test, or until your numbing medicine (local anesthetic) wears off.  When your numbness is gone and your cough and gag reflexes have come back, you may: ? Eat only soft foods. ? Slowly drink liquids.  The day after the test, go back to your normal diet. Driving  Do not drive for 24 hours if you were given a medicine to help you relax (sedative).  Do not drive or use heavy machinery while taking prescription pain medicine. General instructions   Take over-the-counter and prescription medicines only as told by your doctor.  Return to your normal activities as told. Ask what activities are safe for you.  Do not use any products that have nicotine or tobacco in them. This includes cigarettes and e-cigarettes. If you need help quitting, ask your doctor.  Keep all follow-up visits as told by your doctor. This is important. It is very important if you had a tissue sample (biopsy) taken. Get help right away if:  You have shortness of breath that gets worse.  You get light-headed.  You feel like you are going to pass out (faint).  You have chest pain.  You cough up: ? More than a little blood. ? More blood than before. Summary  Do not eat or drink anything (not even water) for 2 hours after your test, or until your numbing medicine wears off.  Do not use cigarettes. Do not use e-cigarettes.  Get help right away if you have chest pain.   This information is not intended to replace advice given to you by your health care provider. Make sure you discuss any questions you have with your health care provider. Document Released:  09/20/2009 Document Revised: 11/05/2017 Document Reviewed: 12/11/2016 Elsevier Patient Education  2020 [employer].

## 2021-01-28 NOTE — Interval H&P Note (Signed)
History and Physical Interval Note:  01/28/2021 6:54 AM  [name] [name]  has presented today for surgery, with the diagnosis of LEFT UPPER LUNG NODULE.  The various methods of treatment have been discussed with the patient and family. After consideration of risks, benefits and other options for treatment, the patient has consented to  Procedure(s): [place] (N/A) as a surgical intervention.  The patient's history has been reviewed, patient examined, no change in status, stable for surgery.  I have reviewed the patient's chart and labs.  Questions were answered to the patient's satisfaction.     [place]

## 2021-01-28 NOTE — Anesthesia Procedure Notes (Signed)
Procedure Name: Intubation Date/Time: 01/28/2021 7:34 AM Performed by: Effie Berkshire, MD Pre-anesthesia Checklist: Patient identified, Emergency Drugs available, Suction available and Patient being monitored Patient Re-evaluated:Patient Re-evaluated prior to induction Oxygen Delivery Method: Circle system utilized Preoxygenation: Pre-oxygenation with 100% oxygen Induction Type: IV induction Ventilation: Two handed mask ventilation required Laryngoscope Size: Mac and 4 Grade View: Grade II Tube type: Oral Tube size: 8.5 mm Number of attempts: 1 Airway Equipment and Method: Stylet and Oral airway Placement Confirmation: ETT inserted through vocal cords under direct vision,  positive ETCO2 and breath sounds checked- equal and bilateral Secured at: 24 cm Tube secured with: Tape Dental Injury: Teeth and Oropharynx as per pre-operative assessment

## 2021-01-28 NOTE — Anesthesia Postprocedure Evaluation (Signed)
Anesthesia Post Note  Patient: [name] [name]  Procedure(s) Performed: VIDEO BRONCHOSCOPY WITH ENDOBRONCHIAL NAVIGATION (N/A ) BRONCHIAL BIOPSIES BRONCHIAL BRUSHINGS BRONCHIAL NEEDLE ASPIRATION BIOPSIES FIDUCIAL MARKER PLACEMENT BRONCHIAL WASHINGS     Patient location during evaluation: PACU Anesthesia Type: General Level of consciousness: awake and alert Pain management: pain level controlled Vital Signs Assessment: post-procedure vital signs reviewed and stable Respiratory status: spontaneous breathing, nonlabored ventilation, respiratory function stable and patient connected to nasal cannula oxygen Cardiovascular status: blood pressure returned to baseline and stable Postop Assessment: no apparent nausea or vomiting Anesthetic complications: no   No complications documented.  Last Vitals:  Vitals:   01/28/21 0945 01/28/21 0953  BP:  (!) 114/52  Pulse: 75 68  Resp: 16 12  Temp:  37.2 C  SpO2: 94% 93%    Last Pain:  Vitals:   01/28/21 0835  TempSrc:   PainSc: 0-No pain                 Effie Berkshire

## 2021-01-29 ENCOUNTER — Encounter (HOSPITAL_COMMUNITY): Payer: Self-pay | Admitting: Pulmonary Disease

## 2021-01-29 DIAGNOSIS — H6123 Impacted cerumen, bilateral: Secondary | ICD-10-CM | POA: Diagnosis not present

## 2021-01-29 LAB — CYTOLOGY - NON PAP

## 2021-01-30 LAB — CYTOLOGY - NON PAP

## 2021-01-31 ENCOUNTER — Encounter: Payer: Self-pay | Admitting: *Deleted

## 2021-01-31 DIAGNOSIS — H6122 Impacted cerumen, left ear: Secondary | ICD-10-CM | POA: Diagnosis not present

## 2021-01-31 NOTE — Progress Notes (Signed)
I followed up on [name] appt and did not he was set up with rad onc at this time. I reached out to their team to get him scheduled.

## 2021-02-03 ENCOUNTER — Other Ambulatory Visit: Payer: Self-pay

## 2021-02-03 ENCOUNTER — Ambulatory Visit (INDEPENDENT_AMBULATORY_CARE_PROVIDER_SITE_OTHER): Payer: Medicare Other | Admitting: *Deleted

## 2021-02-03 DIAGNOSIS — I359 Nonrheumatic aortic valve disorder, unspecified: Secondary | ICD-10-CM | POA: Diagnosis not present

## 2021-02-03 DIAGNOSIS — Z5181 Encounter for therapeutic drug level monitoring: Secondary | ICD-10-CM

## 2021-02-03 LAB — POCT INR: INR: 1.6 — AB (ref 2.0–3.0)

## 2021-02-03 MED ORDER — ENOXAPARIN SODIUM 120 MG/0.8ML ~~LOC~~ SOLN: 120.0000 mg | SUBCUTANEOUS | 0 refills | Status: DC

## 2021-02-03 NOTE — Patient Instructions (Signed)
Description   Continue Lovenox injections. Take 1.5 tablets of warfarin today and 1.5 tablets tomorrow. Then continue to take 1 tablet daily except for 1.5 tablets on Sundays and Thursdays. Recheck INR on Friday 3/4.  Coumadin Clinic for any changes in medications or upcoming procedures. [phone]

## 2021-02-04 ENCOUNTER — Other Ambulatory Visit (HOSPITAL_COMMUNITY): Payer: Self-pay | Admitting: Cardiology

## 2021-02-04 NOTE — Progress Notes (Signed)
Thoracic Location of Tumor / Histology: Left Upper Lobe Non Small Cell Lung Cancer  Patient presented to emergency room December 07, 2020 for shortness of breath and chest pressure.  He has a significant cardiac history.  He received a CT Chest Angio to rule out PE which illustrated a concern for pulmonary nodule.  Nodule was noticed on previous scan approximately 16 months prior but was too small for concern at that time.    Biopsies of 11/65/7903 (if applicable) revealed:     Tobacco/Marijuana/Snuff/ETOH use: Former Smoker; quit 12/15/1993; smoked for 30 years; alcohol in the evenings  Past/Anticipated interventions by cardiothoracic surgery, if any: Extensive surgery history; please see for further details; two cardioversions; a fib  Past/Anticipated interventions by medical oncology, if any: none   Signs/Symptoms  Weight changes, if any: no  Respiratory complaints, if any: Cough  Hemoptysis, if any: no  Pain issues, if any:  no  SAFETY ISSUES:  Prior radiation? no  Pacemaker/ICD? Chart shows Temporary Pacemaker DOES NOT HAVE PACEMAKER   Possible current pregnancy? no  Is the patient on methotrexate? no  Current Complaints / other details:  Enjoys golf and exercises daily.  Lives alone.  No family locally.  Has 1 pet (cat) at home.  Vitals:   02/06/21 1443  Resp: 18  Temp: (!) 96.9 F (36.1 C)  TempSrc: Temporal  SpO2: 95%  Weight: 170 lb (77.1 kg)  Height: 5\' 8"  (1.727 m)

## 2021-02-06 ENCOUNTER — Other Ambulatory Visit: Payer: Self-pay

## 2021-02-06 ENCOUNTER — Encounter: Payer: Self-pay | Admitting: Radiation Oncology

## 2021-02-06 ENCOUNTER — Ambulatory Visit
Admission: RE | Admit: 2021-02-06 | Discharge: 2021-02-06 | Disposition: A | Discharge status: Home / Self Care | Payer: Medicare Other | Source: Ambulatory Visit | Attending: Radiation Oncology | Admitting: Radiation Oncology

## 2021-02-06 DIAGNOSIS — I350 Nonrheumatic aortic (valve) stenosis: Secondary | ICD-10-CM | POA: Diagnosis not present

## 2021-02-06 DIAGNOSIS — D649 Anemia, unspecified: Secondary | ICD-10-CM | POA: Diagnosis not present

## 2021-02-06 DIAGNOSIS — J449 Chronic obstructive pulmonary disease, unspecified: Secondary | ICD-10-CM | POA: Insufficient documentation

## 2021-02-06 DIAGNOSIS — Z87891 Personal history of nicotine dependence: Secondary | ICD-10-CM | POA: Diagnosis not present

## 2021-02-06 DIAGNOSIS — E785 Hyperlipidemia, unspecified: Secondary | ICD-10-CM | POA: Diagnosis not present

## 2021-02-06 DIAGNOSIS — K219 Gastro-esophageal reflux disease without esophagitis: Secondary | ICD-10-CM | POA: Diagnosis not present

## 2021-02-06 DIAGNOSIS — C3492 Malignant neoplasm of unspecified part of left bronchus or lung: Secondary | ICD-10-CM

## 2021-02-06 DIAGNOSIS — Z803 Family history of malignant neoplasm of breast: Secondary | ICD-10-CM | POA: Insufficient documentation

## 2021-02-06 DIAGNOSIS — I2510 Atherosclerotic heart disease of native coronary artery without angina pectoris: Secondary | ICD-10-CM | POA: Insufficient documentation

## 2021-02-06 DIAGNOSIS — Z86010 Personal history of colonic polyps: Secondary | ICD-10-CM | POA: Insufficient documentation

## 2021-02-06 DIAGNOSIS — I10 Essential (primary) hypertension: Secondary | ICD-10-CM | POA: Diagnosis not present

## 2021-02-06 DIAGNOSIS — Z79899 Other long term (current) drug therapy: Secondary | ICD-10-CM | POA: Diagnosis not present

## 2021-02-06 DIAGNOSIS — C3412 Malignant neoplasm of upper lobe, left bronchus or lung: Secondary | ICD-10-CM | POA: Diagnosis not present

## 2021-02-06 DIAGNOSIS — I509 Heart failure, unspecified: Secondary | ICD-10-CM | POA: Diagnosis not present

## 2021-02-06 DIAGNOSIS — Z8673 Personal history of transient ischemic attack (TIA), and cerebral infarction without residual deficits: Secondary | ICD-10-CM | POA: Diagnosis not present

## 2021-02-06 DIAGNOSIS — C3410 Malignant neoplasm of upper lobe, unspecified bronchus or lung: Secondary | ICD-10-CM

## 2021-02-06 DIAGNOSIS — R911 Solitary pulmonary nodule: Secondary | ICD-10-CM

## 2021-02-06 DIAGNOSIS — I4891 Unspecified atrial fibrillation: Secondary | ICD-10-CM | POA: Diagnosis not present

## 2021-02-06 DIAGNOSIS — E1151 Type 2 diabetes mellitus with diabetic peripheral angiopathy without gangrene: Secondary | ICD-10-CM | POA: Diagnosis not present

## 2021-02-06 NOTE — Progress Notes (Signed)
Radiation Oncology         [phone] ________________________________  Initial Outpatient Consultation  Name: [name] [name] MRN: [id number]  Date: 02/06/2021  DOB: [birth year]  PJ:ASNKN, Jenny Reichmann, MD  Garner Nash, DO   REFERRING PHYSICIAN: Garner Nash, DO  DIAGNOSIS: Diagnoses of Lung nodule and Cancer of lingula of lung ([place]) were pertinent to this visit.  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 male who is seen as a courtesy of Dr. Valeta Harms for an opinion concerning radiation therapy as part of management for his recently diagnosed lung cancer. Today, he is accompanied by no one. The patient presented to the ED on 12/07/2020 with complaints of chest pain and shortness of breath. CTA of chest performed during that visit showed an irregular 1.5 cm lingular pulmonary nodule that had increased in size since 09/16/2019 CT scan. Primary bronchogenic carcinoma could not be excluded.   PET scan on 12/13/2020 showed an 11 x 16 mm lingular nodule that was concerning for early primary bronchogenic neoplasm. There were no findings suspicious for metastatic disease.  Subsequently, the patient was seen by Dr. Valeta Harms, who performed a video bronchoscopy with electromagnetic navigation on 01/28/2021. Cytology from the procedure revealed malignant cells consistent with non-small cell carcinoma of the left upper lobe brushing and fine needle aspiration.  Findings most consistent with squamous cell carcinoma  PREVIOUS RADIATION THERAPY: No  PAST MEDICAL HISTORY:  Past Medical History:  Diagnosis Date  . Adenomatous colon polyp 09/1997  . Anemia   . Aortic stenosis    s/p [facility] jud[facility] mechanical AVR - Chronic Coumadin  . Blood transfusion    "related to ITP"  . CHF (congestive heart failure) ([place])   . COPD (chronic obstructive pulmonary disease) ([place])    patient denies this dx on  01/27/21  . Coronary artery disease    s/p cabg x 3 11/2003: lima-lad, seq vg to rpda and rpl  . Diverticulitis of colon   . Dysrhythmia    a-fib  . GERD (gastroesophageal reflux disease)   . Heart murmur   . Hyperlipidemia   . Hypertension   . ITP (idiopathic thrombocytopenic purpura)   . Lung cancer ([place])    last week  . Peripheral arterial disease ([place])    a. history of aortobifemoral bypass grafting by Dr. Sherren Mocha early b. LE angiography 04/22/2015 patent aortobifem graft, DES to R SFA  . Peripheral vascular disease ([place])    s/p Left external Iliac Artery stenting and subsequent left femoral endarterectomy 02/2011- post op course complicated by wound infxn req I&D 03/2011  . Renal artery stenosis, native, bilateral ([place])    a. bilateral renal artery stenosis by recent duplex ultrasound b. L renal artery stent 02/2015, R renal artery patent on angiogram  . Stroke ([place]) 2015  . TIA (transient ischemic attack) ~ 2013  . Type II diabetes mellitus ([place])     PAST SURGICAL HISTORY: Past Surgical History:  Procedure Laterality Date  . ABDOMINAL AORTAGRAM N/A 12/16/2011   Procedure: ABDOMINAL Maxcine Ham;  Surgeon: Sherren Mocha, MD;  Location: [facility] CATH LAB;  Service: Cardiovascular;  Laterality: N/A;  . ABDOMINAL AORTOGRAM W/LOWER EXTREMITY Right 02/29/2020   Procedure: ABDOMINAL AORTOGRAM W/LOWER EXTREMITY;  Surgeon: Lorretta Harp, MD;  Location: [employer] CV LAB;  Service: Cardiovascular;  Laterality: Right;  . ANGIOPLASTY / STENTING ILIAC     Left external Iliac Artery  .  AORTA - BILATERAL FEMORAL ARTERY BYPASS GRAFT  01/18/2012   Procedure: AORTA BIFEMORAL BYPASS GRAFT;  Surgeon: Curt Jews, MD;  Location: [place];  Service: Vascular;  Laterality: N/A;  . AORTIC VALVE REPLACEMENT  ~ 2004  . ATRIAL FIBRILLATION ABLATION N/A 11/20/2020   Procedure: ATRIAL FIBRILLATION ABLATION;  Surgeon: Constance Haw, MD;  Location: [place] CV LAB;  Service: Cardiovascular;  Laterality: N/A;  .  BRONCHIAL BIOPSY  01/28/2021   Procedure: BRONCHIAL BIOPSIES;  Surgeon: Garner Nash, DO;  Location: [place] ENDOSCOPY;  Service: Pulmonary;;  . BRONCHIAL BRUSHINGS  01/28/2021   Procedure: BRONCHIAL BRUSHINGS;  Surgeon: Garner Nash, DO;  Location: [place] ENDOSCOPY;  Service: Pulmonary;;  . BRONCHIAL NEEDLE ASPIRATION BIOPSY  01/28/2021   Procedure: BRONCHIAL NEEDLE ASPIRATION BIOPSIES;  Surgeon: Garner Nash, DO;  Location: [place] ENDOSCOPY;  Service: Pulmonary;;  . BRONCHIAL WASHINGS  01/28/2021   Procedure: BRONCHIAL WASHINGS;  Surgeon: Garner Nash, DO;  Location: [place] ENDOSCOPY;  Service: Pulmonary;;  . CARDIAC CATHETERIZATION  11/2003   /pt report 10/01/2016  . CARDIAC VALVE REPLACEMENT  11/2003   aortic  . CARDIOVERSION N/A 10/16/2019   Procedure: CARDIOVERSION;  Surgeon: Larey Dresser, MD;  Location: [facility] ENDOSCOPY;  Service: Cardiovascular;  Laterality: N/A;  . CARDIOVERSION N/A 01/01/2021   Procedure: CARDIOVERSION;  Surgeon: Larey Dresser, MD;  Location: [facility] ENDOSCOPY;  Service: Cardiovascular;  Laterality: N/A;  . CATARACT EXTRACTION W/ INTRAOCULAR LENS  IMPLANT, BILATERAL Bilateral   . COLONOSCOPY    . COLONOSCOPY WITH PROPOFOL N/A 09/03/2019   Procedure: COLONOSCOPY WITH PROPOFOL;  Surgeon: Doran Stabler, MD;  Location: [facility] ENDOSCOPY;  Service: Gastroenterology;  Laterality: N/A;  . CORONARY ARTERY BYPASS GRAFT  11/2003   Archie Endo 04/21/2011  . CORONARY ATHERECTOMY N/A 10/10/2019   Procedure: CORONARY ATHERECTOMY;  Surgeon: Sherren Mocha, MD;  Location: [place] CV LAB;  Service: Cardiovascular;  Laterality: N/A;  . CORONARY STENT INTERVENTION N/A 10/10/2019   Procedure: CORONARY STENT INTERVENTION;  Surgeon: Sherren Mocha, MD;  Location: [place] CV LAB;  Service: Cardiovascular;  Laterality: N/A;  . CORONARY/GRAFT ANGIOGRAPHY N/A 09/18/2019   Procedure: CORONARY/GRAFT ANGIOGRAPHY;  Surgeon: Lorretta Harp, MD;  Location: [place] CV LAB;  Service:  Cardiovascular;  Laterality: N/A;  . ESOPHAGOGASTRODUODENOSCOPY (EGD) WITH PROPOFOL N/A 02/27/2019   Procedure: ESOPHAGOGASTRODUODENOSCOPY (EGD) WITH PROPOFOL;  Surgeon: Doran Stabler, MD;  Location: [place];  Service: Endoscopy;  Laterality: N/A;  . FIDUCIAL MARKER PLACEMENT  01/28/2021   Procedure: FIDUCIAL MARKER PLACEMENT;  Surgeon: Garner Nash, DO;  Location: [place];  Service: Pulmonary;;  . HEMOSTASIS CLIP PLACEMENT  09/03/2019   Procedure: HEMOSTASIS CLIP PLACEMENT;  Surgeon: Doran Stabler, MD;  Location: [facility] ENDOSCOPY;  Service: Gastroenterology;;  . LOWER EXTREMITY ANGIOGRAM N/A 02/21/2015   Procedure: LOWER EXTREMITY ANGIOGRAM;  Surgeon: Lorretta Harp, MD;  Location: [facility] CATH LAB;  Service: Cardiovascular;  Laterality: N/A;  . PERIPHERAL VASCULAR CATHETERIZATION N/A 04/22/2015   Procedure: Lower Extremity Angiography;  Surgeon: Lorretta Harp, MD;  Location: [place] CV LAB;  Service: Cardiovascular;  Laterality: N/A;  . PERIPHERAL VASCULAR CATHETERIZATION N/A 08/24/2016   Procedure: Lower Extremity Angiography;  Surgeon: Lorretta Harp, MD;  Location: [place] CV LAB;  Service: Cardiovascular;  Laterality: N/A;  . PERIPHERAL VASCULAR CATHETERIZATION Right 10/01/2016   Procedure: Peripheral Vascular Intervention - STENT;  Surgeon: Lorretta Harp, MD;  Location: [place] CV LAB;  Service: Cardiovascular;  Laterality: Right;  Prox and MID SFA   . PERIPHERAL VASCULAR INTERVENTION  02/29/2020   Procedure: PERIPHERAL VASCULAR INTERVENTION;  Surgeon: Lorretta Harp, MD;  Location: [place] CV LAB;  Service: Cardiovascular;;  Right SFA  . POLYPECTOMY    . RENAL ANGIOGRAM N/A 02/21/2015   Procedure: RENAL ANGIOGRAM;  Surgeon: Lorretta Harp, MD;  Location: [facility] CATH LAB;  Service: Cardiovascular;  Laterality: Bilateral; 6 mm x 12 mm long Herculink balloon expandable stent to the left renal artery  . RENAL ARTERY STENT Left 04/22/2015   dr berry  .  SPLENECTOMY  02/2003   Archie Endo 04/21/2011  . TEE WITHOUT CARDIOVERSION N/A 10/16/2019   Procedure: TRANSESOPHAGEAL ECHOCARDIOGRAM (TEE);  Surgeon: Larey Dresser, MD;  Location: [facility] ENDOSCOPY;  Service: Cardiovascular;  Laterality: N/A;  . TEMPORARY PACEMAKER N/A 10/10/2019   Procedure: TEMPORARY PACEMAKER;  Surgeon: Sherren Mocha, MD;  Location: [place] CV LAB;  Service: Cardiovascular;  Laterality: N/A;  . TONSILLECTOMY  ~ 1952  . VIDEO BRONCHOSCOPY WITH ENDOBRONCHIAL NAVIGATION N/A 01/28/2021   Procedure: VIDEO BRONCHOSCOPY WITH ENDOBRONCHIAL NAVIGATION;  Surgeon: Garner Nash, DO;  Location: [place];  Service: Pulmonary;  Laterality: N/A;    FAMILY HISTORY:  Family History  Problem Relation Age of Onset  . Coronary artery disease Mother        bypass surgery - deceased  . Heart disease Father        murmur, valve replacement - deceased  . Breast cancer Sister   . Diabetes Other        grandmother  . Diabetes Paternal Grandmother   . Diabetes Paternal Aunt   . Colon cancer Neg Hx   . Colon polyps Neg Hx   . Esophageal cancer Neg Hx   . Rectal cancer Neg Hx   . Stomach cancer Neg Hx     SOCIAL HISTORY:  Social History   Tobacco Use  . Smoking status: Former Smoker    Years: 30.00    Types: Cigarettes, Cigars    Quit date: 12/15/1993    Years since quitting: 27.1  . Smokeless tobacco: Never Used  . Tobacco comment: occasional cigar  Vaping Use  . Vaping Use: Never used  Substance Use Topics  . Alcohol use: Yes    Alcohol/week: 16.0 standard drinks    Types: 1 Cans of beer, 1 Shots of liquor, 14 Standard drinks or equivalent per week    Comment: drinks 2 martini's a night (2 shots in each)  . Drug use: No    ALLERGIES: No Known Allergies  MEDICATIONS:  Current Outpatient Medications  Medication Sig Dispense Refill  . amiodarone (PACERONE) 200 MG tablet Take 1 tablet (200 mg total) by mouth daily. 30 tablet 3  . atorvastatin (LIPITOR) 80 MG tablet Take  80 mg by mouth daily.    [name] enoxaparin (LOVENOX) 120 MG/0.8ML injection Inject 0.8 mLs (120 mg total) into the skin daily. As instructed by Anticoagulation Clinic. 2.4 mL 0  . ENTRESTO 24-26 MG TAKE 1 TABLET BY MOUTH TWICE A DAY 60 tablet 6  . Evolocumab 140 MG/ML SOAJ Inject 140 mg into the skin every 14 (fourteen) days.    . furosemide (LASIX) 20 MG tablet Take 2 tablets (40 mg total) by mouth daily. 90 tablet 3  . glyBURIDE (DIABETA) 2.5 MG tablet Take 5 mg by mouth 2 (two) times daily with a meal.    . INVOKANA 100 MG TABS tablet Take 100 mg by mouth daily.    . pantoprazole (  PROTONIX) 40 MG tablet Take 1 tablet (40 mg total) by mouth daily. 30 tablet 11  . spironolactone (ALDACTONE) 25 MG tablet Take 1 tablet (25 mg total) by mouth daily. 30 tablet 3  . warfarin (COUMADIN) 5 MG tablet Take 1 to 1 and 1/2 tablets by mouth daily as prescribed by the coumadin clinic. 120 tablet 0  . amoxicillin (AMOXIL) 500 MG capsule Take 2,000 mg by mouth See admin instructions. Take 2000 mg by mouth one hour prior to dental procedures (Patient not taking: Reported on 02/06/2021)    . nitroGLYCERIN (NITROSTAT) 0.4 MG SL tablet Place 1 tablet (0.4 mg total) under the tongue every 5 (five) minutes x 3 doses as needed for chest pain. (Patient not taking: Reported on 02/06/2021) 25 tablet 12   No current facility-administered medications for this encounter.    REVIEW OF SYSTEMS:  A 10+ POINT REVIEW OF SYSTEMS WAS OBTAINED including neurology, dermatology, psychiatry, cardiac, respiratory, lymph, extremities, GI, GU, musculoskeletal, constitutional, reproductive, HEENT.  He denies any pain within the chest area, significant cough or hemoptysis.  Patient denies any breathing issues.   PHYSICAL EXAM:  height is _0  (1.727 m) and weight is 170 lb (77.1 kg). His temporal temperature is 96.9 F (36.1 C) (abnormal). His respiration is 18 and oxygen saturation is 95%.   General: Alert and oriented, in no acute  distress HEENT: Head is normocephalic. Extraocular movements are intact. Oropharynx is clear. Neck: Neck is supple, no palpable cervical or supraclavicular lymphadenopathy. Heart: Regular  rate and rhythm  Chest: Clear to auscultation bilaterally, with no rhonchi, wheezes, or rales.  Scars along the sternum from prior valve and bypass surgery Abdomen: Soft, nontender, nondistended, with no rigidity or guarding. Extremities: No cyanosis or edema. Lymphatics: see Neck Exam Skin: No concerning lesions. Musculoskeletal: symmetric strength and muscle tone throughout. Neurologic: Cranial nerves II through XII are grossly intact. No obvious focalities. Speech is fluent. Coordination is intact. Psychiatric: Judgment and insight are intact. Affect is appropriate.   ECOG = 1  0 - Asymptomatic (Fully active, able to carry on all predisease activities without restriction)  1 - Symptomatic but completely ambulatory (Restricted in physically strenuous activity but ambulatory and able to carry out work of a light or sedentary nature. For example, light housework, office work)  2 - Symptomatic, <50% in bed during the day (Ambulatory and capable of all self care but unable to carry out any work activities. Up and about more than 50% of waking hours)  3 - Symptomatic, >50% in bed, but not bedbound (Capable of only limited self-care, confined to bed or chair 50% or more of waking hours)  4 - Bedbound (Completely disabled. Cannot carry on any self-care. Totally confined to bed or chair)  5 - Death   Eustace Pen MM, Creech RH, Tormey DC, et al. [phone]). "Toxicity and response criteria of the [facility]". [place] Oncol. 5 (6): 649-55  LABORATORY DATA:  Lab Results  Component Value Date   WBC 7.3 12/25/2020   HGB 17.3 (H) 12/25/2020   HCT 53.9 (H) 12/25/2020   MCV 96.8 12/25/2020   PLT 222 12/25/2020   NEUTROABS 7.3 12/07/2020   Lab Results  Component Value Date   NA 137  01/28/2021   K 4.0 01/28/2021   CL 99 01/28/2021   CO2 28 01/28/2021   GLUCOSE 218 (H) 01/28/2021   CREATININE 1.10 01/28/2021   CALCIUM 8.9 01/28/2021      RADIOGRAPHY: DG CHEST PORT  1 VIEW  Result Date: 01/28/2021 CLINICAL DATA:  75 year old male status post bronchoscopy with transbronchial biopsies, BAL this morning. Lingula lung nodule. EXAM: PORTABLE CHEST 1 VIEW COMPARISON:  Chest CTA 12/08/2020 and earlier. FINDINGS: Portable AP semi upright view at 0927 hours. Stable lung volumes and mediastinal contours. Prior cardiac valve replacement. Calcified aortic atherosclerosis. Several small bronchoscopic surgical clips and vague increased opacity in the left mid to lower lung. No pneumothorax. No layering pleural effusion. Lung markings otherwise appears stable. Stable visualized osseous structures. Stable trachea. IMPRESSION: 1. Sequelae of left lower lung bronchoscopic biopsy and BAL with no pneumothorax or adverse features. 2. Prominent calcified aortic atherosclerosis. Electronically Signed   By: Genevie Ann M.D.   On: 01/28/2021 09:44   DG C-ARM BRONCHOSCOPY  Result Date: 01/28/2021 C-ARM BRONCHOSCOPY: Fluoroscopy was utilized by the requesting physician.  No radiographic interpretation.      IMPRESSION: Non-small cell carcinoma of the left upper lobe, clinical stage Ia2, squamous cell carcinoma  The patient has extensive peripheral vascular disease as well as significant coronary artery disease and would not be a good candidate for surgery for management of his clinical stage I non-small cell lung cancer.  He however would be an excellent candidate for stereotactic body radiation therapy directed at his solitary lesion in the lingula of the left upper lobe.  The location of his lesion is ideal for this specialized type of radiation therapy.  I discussed the overall treatment course side effects and potential toxicities of radiation therapy in this situation.  The patient appears to  understand and wishes to proceed with planned course of treatment.   PLAN: The patient will return tomorrow for CT simulation with treatments to begin in approximately a week.  Anticipate 3 SBRT treatments directed at the solitary lesion in the lingula of the left upper lobe.  Total time spent in this encounter was 60 minutes which included reviewing the patient's most recent ED visit, chest CT scan, PET scan, bronchoscopy, cytology report, consultations, physical examination, and documentation.  ------------------------------------------------  Blair Promise, PhD, MD  This document serves as a record of services personally performed by Gery Pray, MD. It was created on his behalf by Clerance Lav, a trained medical scribe. The creation of this record is based on the scribe's personal observations and the provider's statements to them. This document has been checked and approved by the attending provider.

## 2021-02-07 ENCOUNTER — Other Ambulatory Visit: Payer: Self-pay

## 2021-02-07 ENCOUNTER — Ambulatory Visit
Admission: RE | Admit: 2021-02-07 | Discharge: 2021-02-07 | Disposition: A | Discharge status: Home / Self Care | Payer: Medicare Other | Source: Ambulatory Visit | Attending: Radiation Oncology | Admitting: Radiation Oncology

## 2021-02-07 ENCOUNTER — Ambulatory Visit (INDEPENDENT_AMBULATORY_CARE_PROVIDER_SITE_OTHER): Payer: Medicare Other | Admitting: *Deleted

## 2021-02-07 DIAGNOSIS — C3412 Malignant neoplasm of upper lobe, left bronchus or lung: Secondary | ICD-10-CM | POA: Diagnosis not present

## 2021-02-07 DIAGNOSIS — C3410 Malignant neoplasm of upper lobe, unspecified bronchus or lung: Secondary | ICD-10-CM

## 2021-02-07 DIAGNOSIS — Z510 Encounter for antineoplastic radiation therapy: Secondary | ICD-10-CM | POA: Diagnosis not present

## 2021-02-07 DIAGNOSIS — I359 Nonrheumatic aortic valve disorder, unspecified: Secondary | ICD-10-CM

## 2021-02-07 DIAGNOSIS — Z5181 Encounter for therapeutic drug level monitoring: Secondary | ICD-10-CM

## 2021-02-07 DIAGNOSIS — Z87891 Personal history of nicotine dependence: Secondary | ICD-10-CM | POA: Diagnosis not present

## 2021-02-07 DIAGNOSIS — C3492 Malignant neoplasm of unspecified part of left bronchus or lung: Secondary | ICD-10-CM | POA: Diagnosis not present

## 2021-02-07 LAB — POCT INR: INR: 3.4 — AB (ref 2.0–3.0)

## 2021-02-07 NOTE — Patient Instructions (Signed)
Description   Stop Lovenox Injections. Tomorrow take 1/2 tablet of Warfarin then continue to take 1 tablet daily except for 1.5 tablets on Sundays and Thursdays. Recheck INR in 2 weeks.  Coumadin Clinic for any changes in medications or upcoming procedures. [phone]

## 2021-02-14 DIAGNOSIS — Z125 Encounter for screening for malignant neoplasm of prostate: Secondary | ICD-10-CM | POA: Diagnosis not present

## 2021-02-14 DIAGNOSIS — E1122 Type 2 diabetes mellitus with diabetic chronic kidney disease: Secondary | ICD-10-CM | POA: Diagnosis not present

## 2021-02-14 DIAGNOSIS — E785 Hyperlipidemia, unspecified: Secondary | ICD-10-CM | POA: Diagnosis not present

## 2021-02-16 DIAGNOSIS — C3412 Malignant neoplasm of upper lobe, left bronchus or lung: Secondary | ICD-10-CM | POA: Diagnosis not present

## 2021-02-16 DIAGNOSIS — Z510 Encounter for antineoplastic radiation therapy: Secondary | ICD-10-CM | POA: Diagnosis not present

## 2021-02-16 DIAGNOSIS — Z87891 Personal history of nicotine dependence: Secondary | ICD-10-CM | POA: Diagnosis not present

## 2021-02-16 DIAGNOSIS — C3492 Malignant neoplasm of unspecified part of left bronchus or lung: Secondary | ICD-10-CM | POA: Diagnosis not present

## 2021-02-18 ENCOUNTER — Other Ambulatory Visit: Payer: Self-pay

## 2021-02-18 ENCOUNTER — Ambulatory Visit
Admission: RE | Admit: 2021-02-18 | Discharge: 2021-02-18 | Disposition: A | Discharge status: Home / Self Care | Payer: Medicare Other | Source: Ambulatory Visit | Attending: Radiation Oncology | Admitting: Radiation Oncology

## 2021-02-18 DIAGNOSIS — C3410 Malignant neoplasm of upper lobe, unspecified bronchus or lung: Secondary | ICD-10-CM

## 2021-02-18 DIAGNOSIS — Z510 Encounter for antineoplastic radiation therapy: Secondary | ICD-10-CM | POA: Diagnosis not present

## 2021-02-18 DIAGNOSIS — C3492 Malignant neoplasm of unspecified part of left bronchus or lung: Secondary | ICD-10-CM | POA: Diagnosis not present

## 2021-02-19 ENCOUNTER — Encounter: Payer: Self-pay | Admitting: Pulmonary Disease

## 2021-02-19 ENCOUNTER — Ambulatory Visit (INDEPENDENT_AMBULATORY_CARE_PROVIDER_SITE_OTHER): Payer: Medicare Other | Admitting: Pulmonary Disease

## 2021-02-19 VITALS — BP 122/64 | HR 71 | Temp 97.2°F | Ht 68.0 in | Wt 175.0 lb

## 2021-02-19 DIAGNOSIS — J432 Centrilobular emphysema: Secondary | ICD-10-CM | POA: Diagnosis not present

## 2021-02-19 DIAGNOSIS — C3492 Malignant neoplasm of unspecified part of left bronchus or lung: Secondary | ICD-10-CM | POA: Diagnosis not present

## 2021-02-19 DIAGNOSIS — C3410 Malignant neoplasm of upper lobe, unspecified bronchus or lung: Secondary | ICD-10-CM | POA: Diagnosis not present

## 2021-02-19 DIAGNOSIS — R911 Solitary pulmonary nodule: Secondary | ICD-10-CM

## 2021-02-19 NOTE — Progress Notes (Signed)
Synopsis: Referred in January 2021 for PET avid lung nodule by Shon Baton, MD  Subjective:   PATIENT ID: [name] [name] GENDER: male DOB: 1946-10-07, MRN: [id number]  Chief Complaint  Patient presents with  . Follow-up    Pt here for follow up after bronch.  Doing well.  No issues    This is a 75 year old gentleman past medical history of aortic stenosis, mechanical Saint Jude AVR on Coumadin, heart failure, COPD, history of coronary artery disease status post CABG, ITP, peripheral arterial disease, TIA, type 2 diabetes.Patient seen today in the office to discuss CT scan results.  Initial CT was completed at the beginning of January which revealed a 1.5 cm lingular nodule.  Patient had a PET scan completed which revealed an SUV max of 2.311 x 16 mm lingular nodule concerning for primary bronchogenic carcinoma.  Patient has a significant cardiac history.  Last week was taken to endoscopy for cardioversion.  By Dr. Aundra Dubin.  Currently anticoagulated with Coumadin.  History of AVR.  He has no complaints today from a respiratory standpoint.  Former smoker.  Denies hemoptysis weight loss fatigue.  OV 02/19/2021: Here today for follow-up after bronchoscopy.  Cytology from bronchoscopy was positive for a squamous cell carcinoma.  Patient was referred to radiation oncology.  Met with Dr. Sofie Hartigan on 02/06/2021.  Office notes reviewed today.  Patient was planned for SBRT for stage Ia 2 lung cancer.  Patient had his first treatment yesterday.  He did well with this.  Plan for 3 treatments of SBRT with follow-up.  From a respiratory standpoint patient is doing well with no complaints today.  Denies fevers chills night sweats weight loss and hemoptysis.   Past Medical History:  Diagnosis Date  . Adenomatous colon polyp 09/1997  . Anemia   . Aortic stenosis    s/p st. jude mechanical AVR - Chronic Coumadin  . Blood transfusion    "related to ITP"  . CHF (congestive heart failure) ([place])   . COPD  (chronic obstructive pulmonary disease) ([place])    patient denies this dx on 01/27/21  . Coronary artery disease    s/p cabg x 3 11/2003: lima-lad, seq vg to rpda and rpl  . Diverticulitis of colon   . Dysrhythmia    a-fib  . GERD (gastroesophageal reflux disease)   . Heart murmur   . Hyperlipidemia   . Hypertension   . ITP (idiopathic thrombocytopenic purpura)   . Lung cancer ([place])    last week  . Peripheral arterial disease ([place])    a. history of aortobifemoral bypass grafting by Dr. Sherren Mocha early b. LE angiography 04/22/2015 patent aortobifem graft, DES to R SFA  . Peripheral vascular disease ([place])    s/p Left external Iliac Artery stenting and subsequent left femoral endarterectomy 02/2011- post op course complicated by wound infxn req I&D 03/2011  . Renal artery stenosis, native, bilateral ([place])    a. bilateral renal artery stenosis by recent duplex ultrasound b. L renal artery stent 02/2015, R renal artery patent on angiogram  . Stroke ([place]) 2015  . TIA (transient ischemic attack) ~ 2013  . Type II diabetes mellitus (HCC)      Family History  Problem Relation Age of Onset  . Coronary artery disease Mother        bypass surgery - deceased  . Heart disease Father        murmur, valve replacement - deceased  . Breast cancer Sister   . Diabetes Other  grandmother  . Diabetes Paternal Grandmother   . Diabetes Paternal Aunt   . Colon cancer Neg Hx   . Colon polyps Neg Hx   . Esophageal cancer Neg Hx   . Rectal cancer Neg Hx   . Stomach cancer Neg Hx      Past Surgical History:  Procedure Laterality Date  . ABDOMINAL AORTAGRAM N/A 12/16/2011   Procedure: ABDOMINAL Maxcine Ham;  Surgeon: Sherren Mocha, MD;  Location: [facility] CATH LAB;  Service: Cardiovascular;  Laterality: N/A;  . ABDOMINAL AORTOGRAM W/LOWER EXTREMITY Right 02/29/2020   Procedure: ABDOMINAL AORTOGRAM W/LOWER EXTREMITY;  Surgeon: Lorretta Harp, MD;  Location: [place] CV LAB;  Service: Cardiovascular;   Laterality: Right;  . ANGIOPLASTY / STENTING ILIAC     Left external Iliac Artery  . AORTA - BILATERAL FEMORAL ARTERY BYPASS GRAFT  01/18/2012   Procedure: AORTA BIFEMORAL BYPASS GRAFT;  Surgeon: Curt Jews, MD;  Location: [place];  Service: Vascular;  Laterality: N/A;  . AORTIC VALVE REPLACEMENT  ~ 2004  . ATRIAL FIBRILLATION ABLATION N/A 11/20/2020   Procedure: ATRIAL FIBRILLATION ABLATION;  Surgeon: Constance Haw, MD;  Location: [place] CV LAB;  Service: Cardiovascular;  Laterality: N/A;  . BRONCHIAL BIOPSY  01/28/2021   Procedure: BRONCHIAL BIOPSIES;  Surgeon: Garner Nash, DO;  Location: [place] ENDOSCOPY;  Service: Pulmonary;;  . BRONCHIAL BRUSHINGS  01/28/2021   Procedure: BRONCHIAL BRUSHINGS;  Surgeon: Garner Nash, DO;  Location: [place] ENDOSCOPY;  Service: Pulmonary;;  . BRONCHIAL NEEDLE ASPIRATION BIOPSY  01/28/2021   Procedure: BRONCHIAL NEEDLE ASPIRATION BIOPSIES;  Surgeon: Garner Nash, DO;  Location: [facility] ENDOSCOPY;  Service: Pulmonary;;  . BRONCHIAL WASHINGS  01/28/2021   Procedure: BRONCHIAL WASHINGS;  Surgeon: Garner Nash, DO;  Location: [place] ENDOSCOPY;  Service: Pulmonary;;  . CARDIAC CATHETERIZATION  11/2003   /pt report 10/01/2016  . CARDIAC VALVE REPLACEMENT  11/2003   aortic  . CARDIOVERSION N/A 10/16/2019   Procedure: CARDIOVERSION;  Surgeon: Larey Dresser, MD;  Location: [facility] ENDOSCOPY;  Service: Cardiovascular;  Laterality: N/A;  . CARDIOVERSION N/A 01/01/2021   Procedure: CARDIOVERSION;  Surgeon: Larey Dresser, MD;  Location: [facility] ENDOSCOPY;  Service: Cardiovascular;  Laterality: N/A;  . CATARACT EXTRACTION W/ INTRAOCULAR LENS  IMPLANT, BILATERAL Bilateral   . COLONOSCOPY    . COLONOSCOPY WITH PROPOFOL N/A 09/03/2019   Procedure: COLONOSCOPY WITH PROPOFOL;  Surgeon: Doran Stabler, MD;  Location: [facility] ENDOSCOPY;  Service: Gastroenterology;  Laterality: N/A;  . CORONARY ARTERY BYPASS GRAFT  11/2003   Archie Endo 04/21/2011  . CORONARY ATHERECTOMY N/A  10/10/2019   Procedure: CORONARY ATHERECTOMY;  Surgeon: Sherren Mocha, MD;  Location: [place] CV LAB;  Service: Cardiovascular;  Laterality: N/A;  . CORONARY STENT INTERVENTION N/A 10/10/2019   Procedure: CORONARY STENT INTERVENTION;  Surgeon: Sherren Mocha, MD;  Location: [place] CV LAB;  Service: Cardiovascular;  Laterality: N/A;  . CORONARY/GRAFT ANGIOGRAPHY N/A 09/18/2019   Procedure: CORONARY/GRAFT ANGIOGRAPHY;  Surgeon: Lorretta Harp, MD;  Location: [place] CV LAB;  Service: Cardiovascular;  Laterality: N/A;  . ESOPHAGOGASTRODUODENOSCOPY (EGD) WITH PROPOFOL N/A 02/27/2019   Procedure: ESOPHAGOGASTRODUODENOSCOPY (EGD) WITH PROPOFOL;  Surgeon: Doran Stabler, MD;  Location: [place];  Service: Endoscopy;  Laterality: N/A;  . FIDUCIAL MARKER PLACEMENT  01/28/2021   Procedure: FIDUCIAL MARKER PLACEMENT;  Surgeon: Garner Nash, DO;  Location: [place] ENDOSCOPY;  Service: Pulmonary;;  . HEMOSTASIS CLIP PLACEMENT  09/03/2019   Procedure: HEMOSTASIS CLIP PLACEMENT;  Surgeon: Nelida Meuse  III, MD;  Location: [facility] ENDOSCOPY;  Service: Gastroenterology;;  . LOWER EXTREMITY ANGIOGRAM N/A 02/21/2015   Procedure: LOWER EXTREMITY ANGIOGRAM;  Surgeon: Lorretta Harp, MD;  Location: [facility] CATH LAB;  Service: Cardiovascular;  Laterality: N/A;  . PERIPHERAL VASCULAR CATHETERIZATION N/A 04/22/2015   Procedure: Lower Extremity Angiography;  Surgeon: Lorretta Harp, MD;  Location: [place] CV LAB;  Service: Cardiovascular;  Laterality: N/A;  . PERIPHERAL VASCULAR CATHETERIZATION N/A 08/24/2016   Procedure: Lower Extremity Angiography;  Surgeon: Lorretta Harp, MD;  Location: [place] CV LAB;  Service: Cardiovascular;  Laterality: N/A;  . PERIPHERAL VASCULAR CATHETERIZATION Right 10/01/2016   Procedure: Peripheral Vascular Intervention - STENT;  Surgeon: Lorretta Harp, MD;  Location: [place] CV LAB;  Service: Cardiovascular;  Laterality: Right;  Prox and MID SFA   . PERIPHERAL  VASCULAR INTERVENTION  02/29/2020   Procedure: PERIPHERAL VASCULAR INTERVENTION;  Surgeon: Lorretta Harp, MD;  Location: [place] CV LAB;  Service: Cardiovascular;;  Right SFA  . POLYPECTOMY    . RENAL ANGIOGRAM N/A 02/21/2015   Procedure: RENAL ANGIOGRAM;  Surgeon: Lorretta Harp, MD;  Location: [facility] CATH LAB;  Service: Cardiovascular;  Laterality: Bilateral; 6 mm x 12 mm long Herculink balloon expandable stent to the left renal artery  . RENAL ARTERY STENT Left 04/22/2015   dr berry  . SPLENECTOMY  02/2003   Archie Endo 04/21/2011  . TEE WITHOUT CARDIOVERSION N/A 10/16/2019   Procedure: TRANSESOPHAGEAL ECHOCARDIOGRAM (TEE);  Surgeon: Larey Dresser, MD;  Location: [facility] ENDOSCOPY;  Service: Cardiovascular;  Laterality: N/A;  . TEMPORARY PACEMAKER N/A 10/10/2019   Procedure: TEMPORARY PACEMAKER;  Surgeon: Sherren Mocha, MD;  Location: [place] CV LAB;  Service: Cardiovascular;  Laterality: N/A;  . TONSILLECTOMY  ~ 1952  . VIDEO BRONCHOSCOPY WITH ENDOBRONCHIAL NAVIGATION N/A 01/28/2021   Procedure: VIDEO BRONCHOSCOPY WITH ENDOBRONCHIAL NAVIGATION;  Surgeon: Garner Nash, DO;  Location: [place];  Service: Pulmonary;  Laterality: N/A;    Social History   Socioeconomic History  . Marital status: Divorced    Spouse name: Not on file  . Number of children: 3  . Years of education: Not on file  . Highest education level: Not on file  Occupational History  . Occupation: Retired  Tobacco Use  . Smoking status: Former Smoker    Years: 30.00    Types: Cigarettes, Cigars    Quit date: 12/15/1993    Years since quitting: 27.2  . Smokeless tobacco: Never Used  . Tobacco comment: occasional cigar  Vaping Use  . Vaping Use: Never used  Substance and Sexual Activity  . Alcohol use: Yes    Alcohol/week: 16.0 standard drinks    Types: 1 Cans of beer, 1 Shots of liquor, 14 Standard drinks or equivalent per week    Comment: drinks 2 martini's a night (2 shots in each)  . Drug use: No   . Sexual activity: Yes  Other Topics Concern  . Not on file  Social History Narrative   Tries to remain active.  Frequent golfer but claudication limits this.   Social Determinants of Health   Financial Resource Strain: Not on file  Food Insecurity: Not on file  Transportation Needs: Not on file  Physical Activity: Not on file  Stress: Not on file  Social Connections: Not on file  Intimate Partner Violence: Not on file     No Known Allergies   Outpatient Medications Prior to Visit  Medication Sig Dispense Refill  . amiodarone (PACERONE)  200 MG tablet Take 1 tablet (200 mg total) by mouth daily. 30 tablet 3  . amoxicillin (AMOXIL) 500 MG capsule Take 2,000 mg by mouth See admin instructions. Take 2000 mg by mouth one hour prior to dental procedures    . atorvastatin (LIPITOR) 80 MG tablet Take 80 mg by mouth daily.    [name] ENTRESTO 24-26 MG TAKE 1 TABLET BY MOUTH TWICE A DAY 60 tablet 6  . Evolocumab 140 MG/ML SOAJ Inject 140 mg into the skin every 14 (fourteen) days.    . furosemide (LASIX) 20 MG tablet Take 2 tablets (40 mg total) by mouth daily. 90 tablet 3  . glyBURIDE (DIABETA) 2.5 MG tablet Take 5 mg by mouth 2 (two) times daily with a meal.    . INVOKANA 100 MG TABS tablet Take 100 mg by mouth daily.    . nitroGLYCERIN (NITROSTAT) 0.4 MG SL tablet Place 1 tablet (0.4 mg total) under the tongue every 5 (five) minutes x 3 doses as needed for chest pain. 25 tablet 12  . pantoprazole (PROTONIX) 40 MG tablet Take 1 tablet (40 mg total) by mouth daily. 30 tablet 11  . spironolactone (ALDACTONE) 25 MG tablet Take 1 tablet (25 mg total) by mouth daily. 30 tablet 3  . warfarin (COUMADIN) 5 MG tablet Take 1 to 1 and 1/2 tablets by mouth daily as prescribed by the coumadin clinic. 120 tablet 0  . enoxaparin (LOVENOX) 120 MG/0.8ML injection Inject 0.8 mLs (120 mg total) into the skin daily. As instructed by Anticoagulation Clinic. (Patient not taking: Reported on 02/19/2021) 2.4 mL 0   No  facility-administered medications prior to visit.    Review of Systems  Constitutional: Negative for chills, fever, malaise/fatigue and weight loss.  HENT: Negative for hearing loss, sore throat and tinnitus.   Eyes: Negative for blurred vision and double vision.  Respiratory: Negative for cough, hemoptysis, sputum production, shortness of breath, wheezing and stridor.   Cardiovascular: Negative for chest pain, palpitations, orthopnea, leg swelling and PND.  Gastrointestinal: Negative for abdominal pain, constipation, diarrhea, heartburn, nausea and vomiting.  Genitourinary: Negative for dysuria, hematuria and urgency.  Musculoskeletal: Negative for joint pain and myalgias.  Skin: Negative for itching and rash.  Neurological: Negative for dizziness, tingling, weakness and headaches.  Endo/Heme/Allergies: Negative for environmental allergies. Does not bruise/bleed easily.  Psychiatric/Behavioral: Negative for depression. The patient is not nervous/anxious and does not have insomnia.   All other systems reviewed and are negative.    Objective:  Physical Exam Vitals reviewed.  Constitutional:      General: He is not in acute distress.    Appearance: He is well-developed.  HENT:     Head: Normocephalic and atraumatic.  Eyes:     General: No scleral icterus.    Conjunctiva/sclera: Conjunctivae normal.     Pupils: Pupils are equal, round, and reactive to light.  Neck:     Vascular: No JVD.     Trachea: No tracheal deviation.  Cardiovascular:     Rate and Rhythm: Normal rate and regular rhythm.     Heart sounds: Murmur heard.      Comments: Mechanical click  Pulmonary:     Effort: Pulmonary effort is normal. No tachypnea, accessory muscle usage or respiratory distress.     Breath sounds: Normal breath sounds. No stridor. No wheezing, rhonchi or rales.  Abdominal:     General: Bowel sounds are normal. There is no distension.     Palpations: Abdomen is soft.  Tenderness: There  is no abdominal tenderness.  Musculoskeletal:        General: No tenderness.     Cervical back: Neck supple.  Lymphadenopathy:     Cervical: No cervical adenopathy.  Skin:    General: Skin is warm and dry.     Capillary Refill: Capillary refill takes less than 2 seconds.     Findings: No rash.  Neurological:     Mental Status: He is alert and oriented to person, place, and time.  Psychiatric:        Behavior: Behavior normal.      Vitals:   02/19/21 0949  BP: 122/64  Pulse: 71  Temp: (!) 97.2 F (36.2 C)  TempSrc: Tympanic  SpO2: 92%  Weight: 175 lb (79.4 kg)  Height: $Remove'5\' 8"'MxBwiIX$  (1.727 m)   92% on  RA BMI Readings from Last 3 Encounters:  02/19/21 26.61 kg/m  02/06/21 25.85 kg/m  01/28/21 25.54 kg/m   Wt Readings from Last 3 Encounters:  02/19/21 175 lb (79.4 kg)  02/06/21 170 lb (77.1 kg)  01/28/21 168 lb (76.2 kg)     CBC    Component Value Date/Time   WBC 7.3 12/25/2020 1008   RBC 5.57 12/25/2020 1008   HGB 17.3 (H) 12/25/2020 1008   HGB 15.9 11/04/2020 1529   HCT 53.9 (H) 12/25/2020 1008   HCT 47.4 11/04/2020 1529   PLT 222 12/25/2020 1008   PLT 216 11/04/2020 1529   MCV 96.8 12/25/2020 1008   MCV 95 11/04/2020 1529   MCH 31.1 12/25/2020 1008   MCHC 32.1 12/25/2020 1008   RDW 14.7 12/25/2020 1008   RDW 13.3 11/04/2020 1529   LYMPHSABS 2.0 12/07/2020 2356   MONOABS 1.4 (H) 12/07/2020 2356   EOSABS 0.1 12/07/2020 2356   BASOSABS 0.1 12/07/2020 2356    Chest Imaging: 12/08/2020 CT chest: 11 x 16 mm lingular lung nodule concerning for primary bronchogenic carcinoma with lobulated edges. The patient's images have been independently reviewed by me.    12/13/2020 nuclear medicine PET/CT: A 15 mm lingular nodule SUV max 2.3 concerning for primary bronchogenic carcinoma. The patient's images have been independently reviewed by me.    Pulmonary Functions Testing Results: No flowsheet data found.  FeNO:   Pathology:   Echocardiogram:   Heart  Catheterization:     Assessment & Plan:     ICD-10-CM   1. Squamous cell carcinoma of left lung (HCC)  C34.92   2. Nodule of upper lobe of left lung  R91.1   3. Cancer of lingula of lung (HCC)  C34.10   4. Centrilobular emphysema ([place])  J43.2     Discussion: This is a 75 year old gentleman, severe coronary disease history of AVR on Coumadin, heart failure on Entresto, recent cardioversion.  Underwent navigational bronchoscopy for a lingular lesion consistent with a stage I primary bronchogenic carcinoma, squamous cell carcinoma of the lung.  Plan: Patient has establish care with radiation oncology 3 planned treatments of SBRT. From a respiratory standpoint patient is doing well. He is to contact her office if anything changes over the coming months.  Patient to follow-up with [place] in 1 year or as needed. He should have repeat noncontrasted CT imaging follow-up after radiation treatments with radiation oncology.  They will reach out to me if anything changes or is new on his CT imaging.    Current Outpatient Medications:  .  amiodarone (PACERONE) 200 MG tablet, Take 1 tablet (200 mg total) by mouth daily., Disp: 30  tablet, Rfl: 3 .  amoxicillin (AMOXIL) 500 MG capsule, Take 2,000 mg by mouth See admin instructions. Take 2000 mg by mouth one hour prior to dental procedures, Disp: , Rfl:  .  atorvastatin (LIPITOR) 80 MG tablet, Take 80 mg by mouth daily., Disp: , Rfl:  .  ENTRESTO 24-26 MG, TAKE 1 TABLET BY MOUTH TWICE A DAY, Disp: 60 tablet, Rfl: 6 .  Evolocumab 140 MG/ML SOAJ, Inject 140 mg into the skin every 14 (fourteen) days., Disp: , Rfl:  .  furosemide (LASIX) 20 MG tablet, Take 2 tablets (40 mg total) by mouth daily., Disp: 90 tablet, Rfl: 3 .  glyBURIDE (DIABETA) 2.5 MG tablet, Take 5 mg by mouth 2 (two) times daily with a meal., Disp: , Rfl:  .  INVOKANA 100 MG TABS tablet, Take 100 mg by mouth daily., Disp: , Rfl:  .  nitroGLYCERIN (NITROSTAT) 0.4 MG SL tablet, Place 1  tablet (0.4 mg total) under the tongue every 5 (five) minutes x 3 doses as needed for chest pain., Disp: 25 tablet, Rfl: 12 .  pantoprazole (PROTONIX) 40 MG tablet, Take 1 tablet (40 mg total) by mouth daily., Disp: 30 tablet, Rfl: 11 .  spironolactone (ALDACTONE) 25 MG tablet, Take 1 tablet (25 mg total) by mouth daily., Disp: 30 tablet, Rfl: 3 .  warfarin (COUMADIN) 5 MG tablet, Take 1 to 1 and 1/2 tablets by mouth daily as prescribed by the coumadin clinic., Disp: 120 tablet, Rfl: 0   Garner Nash, DO  Pulmonary Critical Care 02/19/2021 10:10 AM

## 2021-02-19 NOTE — Patient Instructions (Addendum)
Thank you for visiting Dr. Valeta Harms at [facility] Pulmonary. Today we recommend the following:  Return in about 1 year (around 02/19/2022) for with APP or Dr. Valeta Harms.    Please do your part to reduce the spread of COVID-19.

## 2021-02-20 ENCOUNTER — Ambulatory Visit
Admission: RE | Admit: 2021-02-20 | Discharge: 2021-02-20 | Disposition: A | Discharge status: Home / Self Care | Payer: Medicare Other | Source: Ambulatory Visit | Attending: Radiation Oncology | Admitting: Radiation Oncology

## 2021-02-20 ENCOUNTER — Other Ambulatory Visit: Payer: Self-pay

## 2021-02-20 DIAGNOSIS — C3410 Malignant neoplasm of upper lobe, unspecified bronchus or lung: Secondary | ICD-10-CM

## 2021-02-20 DIAGNOSIS — C3492 Malignant neoplasm of unspecified part of left bronchus or lung: Secondary | ICD-10-CM | POA: Diagnosis not present

## 2021-02-20 DIAGNOSIS — Z510 Encounter for antineoplastic radiation therapy: Secondary | ICD-10-CM | POA: Diagnosis not present

## 2021-02-21 DIAGNOSIS — I2510 Atherosclerotic heart disease of native coronary artery without angina pectoris: Secondary | ICD-10-CM | POA: Diagnosis not present

## 2021-02-21 DIAGNOSIS — Z0000 Encounter for general adult medical examination without abnormal findings: Secondary | ICD-10-CM | POA: Diagnosis not present

## 2021-02-21 DIAGNOSIS — R82998 Other abnormal findings in urine: Secondary | ICD-10-CM | POA: Diagnosis not present

## 2021-02-21 DIAGNOSIS — I739 Peripheral vascular disease, unspecified: Secondary | ICD-10-CM | POA: Diagnosis not present

## 2021-02-21 DIAGNOSIS — E1122 Type 2 diabetes mellitus with diabetic chronic kidney disease: Secondary | ICD-10-CM | POA: Diagnosis not present

## 2021-02-21 DIAGNOSIS — I771 Stricture of artery: Secondary | ICD-10-CM | POA: Diagnosis not present

## 2021-02-21 DIAGNOSIS — Z1331 Encounter for screening for depression: Secondary | ICD-10-CM | POA: Diagnosis not present

## 2021-02-21 DIAGNOSIS — J449 Chronic obstructive pulmonary disease, unspecified: Secondary | ICD-10-CM | POA: Diagnosis not present

## 2021-02-21 DIAGNOSIS — I1310 Hypertensive heart and chronic kidney disease without heart failure, with stage 1 through stage 4 chronic kidney disease, or unspecified chronic kidney disease: Secondary | ICD-10-CM | POA: Diagnosis not present

## 2021-02-21 DIAGNOSIS — E1151 Type 2 diabetes mellitus with diabetic peripheral angiopathy without gangrene: Secondary | ICD-10-CM | POA: Diagnosis not present

## 2021-02-21 DIAGNOSIS — I5032 Chronic diastolic (congestive) heart failure: Secondary | ICD-10-CM | POA: Diagnosis not present

## 2021-02-21 DIAGNOSIS — I700 Atherosclerosis of aorta: Secondary | ICD-10-CM | POA: Diagnosis not present

## 2021-02-21 DIAGNOSIS — Z1339 Encounter for screening examination for other mental health and behavioral disorders: Secondary | ICD-10-CM | POA: Diagnosis not present

## 2021-02-21 DIAGNOSIS — N1831 Chronic kidney disease, stage 3a: Secondary | ICD-10-CM | POA: Diagnosis not present

## 2021-02-21 DIAGNOSIS — C3492 Malignant neoplasm of unspecified part of left bronchus or lung: Secondary | ICD-10-CM | POA: Diagnosis not present

## 2021-02-24 ENCOUNTER — Other Ambulatory Visit: Payer: Self-pay

## 2021-02-24 ENCOUNTER — Ambulatory Visit (INDEPENDENT_AMBULATORY_CARE_PROVIDER_SITE_OTHER): Payer: Medicare Other

## 2021-02-24 ENCOUNTER — Ambulatory Visit (HOSPITAL_COMMUNITY)
Admission: RE | Admit: 2021-02-24 | Discharge: 2021-02-24 | Disposition: A | Discharge status: Home / Self Care | Payer: Medicare Other | Source: Ambulatory Visit | Attending: Internal Medicine | Admitting: Internal Medicine

## 2021-02-24 DIAGNOSIS — I480 Paroxysmal atrial fibrillation: Secondary | ICD-10-CM | POA: Diagnosis not present

## 2021-02-24 DIAGNOSIS — I359 Nonrheumatic aortic valve disorder, unspecified: Secondary | ICD-10-CM | POA: Diagnosis not present

## 2021-02-24 DIAGNOSIS — G453 Amaurosis fugax: Secondary | ICD-10-CM | POA: Diagnosis not present

## 2021-02-24 DIAGNOSIS — Z5181 Encounter for therapeutic drug level monitoring: Secondary | ICD-10-CM | POA: Diagnosis not present

## 2021-02-24 DIAGNOSIS — Z85828 Personal history of other malignant neoplasm of skin: Secondary | ICD-10-CM | POA: Diagnosis not present

## 2021-02-24 DIAGNOSIS — D692 Other nonthrombocytopenic purpura: Secondary | ICD-10-CM | POA: Diagnosis not present

## 2021-02-24 DIAGNOSIS — L821 Other seborrheic keratosis: Secondary | ICD-10-CM | POA: Diagnosis not present

## 2021-02-24 DIAGNOSIS — L570 Actinic keratosis: Secondary | ICD-10-CM | POA: Diagnosis not present

## 2021-02-24 LAB — BASIC METABOLIC PANEL
Anion gap: 10 (ref 5–15)
BUN: 22 mg/dL (ref 8–23)
CO2: 29 mmol/L (ref 22–32)
Calcium: 9.5 mg/dL (ref 8.9–10.3)
Chloride: 100 mmol/L (ref 98–111)
Creatinine, Ser: 1.02 mg/dL (ref 0.61–1.24)
GFR, Estimated: >60 mL/min (ref >60)
Glucose, Bld: 259 mg/dL — ABNORMAL HIGH (ref 70–99)
Potassium: 4.4 mmol/L (ref 3.5–5.1)
Sodium: 139 mmol/L (ref 135–145)

## 2021-02-24 LAB — POCT INR: INR: 3.1 — AB (ref 2.0–3.0)

## 2021-02-24 NOTE — Patient Instructions (Signed)
-   have a serving of greens tonight or tomorrow - continue 1 tablet daily except for 1.5 tablets on Sundays and Thursdays.  - Recheck INR in 3 weeks.   Coumadin Clinic for any changes in medications or upcoming procedures. [phone]

## 2021-02-25 ENCOUNTER — Ambulatory Visit
Admission: RE | Admit: 2021-02-25 | Discharge: 2021-02-25 | Disposition: A | Discharge status: Home / Self Care | Payer: Medicare Other | Source: Ambulatory Visit | Attending: Radiation Oncology | Admitting: Radiation Oncology

## 2021-02-25 ENCOUNTER — Encounter: Payer: Self-pay | Admitting: Radiation Oncology

## 2021-02-25 DIAGNOSIS — C3410 Malignant neoplasm of upper lobe, unspecified bronchus or lung: Secondary | ICD-10-CM

## 2021-02-25 DIAGNOSIS — Z87891 Personal history of nicotine dependence: Secondary | ICD-10-CM | POA: Diagnosis not present

## 2021-02-25 DIAGNOSIS — Z510 Encounter for antineoplastic radiation therapy: Secondary | ICD-10-CM | POA: Diagnosis not present

## 2021-02-25 DIAGNOSIS — C3492 Malignant neoplasm of unspecified part of left bronchus or lung: Secondary | ICD-10-CM | POA: Diagnosis not present

## 2021-02-25 DIAGNOSIS — C3412 Malignant neoplasm of upper lobe, left bronchus or lung: Secondary | ICD-10-CM | POA: Diagnosis not present

## 2021-02-27 DIAGNOSIS — H6122 Impacted cerumen, left ear: Secondary | ICD-10-CM | POA: Diagnosis not present

## 2021-02-28 DIAGNOSIS — Z23 Encounter for immunization: Secondary | ICD-10-CM | POA: Diagnosis not present

## 2021-03-05 ENCOUNTER — Other Ambulatory Visit (HOSPITAL_COMMUNITY): Payer: Self-pay | Admitting: Cardiology

## 2021-03-05 ENCOUNTER — Other Ambulatory Visit (HOSPITAL_COMMUNITY): Payer: Self-pay | Admitting: Cardiovascular Disease

## 2021-03-05 DIAGNOSIS — I739 Peripheral vascular disease, unspecified: Secondary | ICD-10-CM

## 2021-03-05 DIAGNOSIS — Z9862 Peripheral vascular angioplasty status: Secondary | ICD-10-CM

## 2021-03-05 DIAGNOSIS — Z95828 Presence of other vascular implants and grafts: Secondary | ICD-10-CM

## 2021-03-10 ENCOUNTER — Ambulatory Visit (HOSPITAL_COMMUNITY)
Admission: RE | Admit: 2021-03-10 | Discharge: 2021-03-10 | Disposition: A | Discharge status: Home / Self Care | Payer: Medicare Other | Source: Ambulatory Visit | Attending: Cardiology | Admitting: Cardiology

## 2021-03-10 ENCOUNTER — Other Ambulatory Visit: Payer: Self-pay

## 2021-03-10 DIAGNOSIS — I6523 Occlusion and stenosis of bilateral carotid arteries: Secondary | ICD-10-CM

## 2021-03-10 DIAGNOSIS — I350 Nonrheumatic aortic (valve) stenosis: Secondary | ICD-10-CM | POA: Diagnosis not present

## 2021-03-10 DIAGNOSIS — I739 Peripheral vascular disease, unspecified: Secondary | ICD-10-CM

## 2021-03-17 ENCOUNTER — Other Ambulatory Visit: Payer: Self-pay

## 2021-03-17 ENCOUNTER — Ambulatory Visit (INDEPENDENT_AMBULATORY_CARE_PROVIDER_SITE_OTHER): Payer: Medicare Other

## 2021-03-17 DIAGNOSIS — I359 Nonrheumatic aortic valve disorder, unspecified: Secondary | ICD-10-CM | POA: Diagnosis not present

## 2021-03-17 DIAGNOSIS — G453 Amaurosis fugax: Secondary | ICD-10-CM | POA: Diagnosis not present

## 2021-03-17 DIAGNOSIS — Z5181 Encounter for therapeutic drug level monitoring: Secondary | ICD-10-CM

## 2021-03-17 LAB — POCT INR: INR: 3.5 — AB (ref 2.0–3.0)

## 2021-03-17 NOTE — Patient Instructions (Signed)
-   since you've taken your warfarin today, please take 1/2 tablet tomorrow, then  - continue 1 tablet daily except for 1.5 tablets on Sundays and Thursdays.  - Recheck INR in 4 weeks.   Coumadin Clinic for any changes in medications or upcoming procedures. [phone]

## 2021-03-22 ENCOUNTER — Other Ambulatory Visit (HOSPITAL_COMMUNITY): Payer: Self-pay | Admitting: Cardiology

## 2021-03-24 ENCOUNTER — Other Ambulatory Visit: Payer: Self-pay | Admitting: Cardiovascular Disease

## 2021-03-25 ENCOUNTER — Encounter: Payer: Self-pay | Admitting: Radiology

## 2021-04-03 ENCOUNTER — Ambulatory Visit
Admission: RE | Admit: 2021-04-03 | Discharge: 2021-04-03 | Disposition: A | Discharge status: Home / Self Care | Payer: Medicare Other | Source: Ambulatory Visit | Attending: Radiation Oncology | Admitting: Radiation Oncology

## 2021-04-03 ENCOUNTER — Other Ambulatory Visit: Payer: Self-pay

## 2021-04-03 ENCOUNTER — Encounter: Payer: Self-pay | Admitting: Radiation Oncology

## 2021-04-03 VITALS — BP 137/55 | HR 76 | Temp 97.9°F | Resp 20 | Ht 68.0 in | Wt 174.6 lb

## 2021-04-03 DIAGNOSIS — C3492 Malignant neoplasm of unspecified part of left bronchus or lung: Secondary | ICD-10-CM

## 2021-04-03 DIAGNOSIS — C3412 Malignant neoplasm of upper lobe, left bronchus or lung: Secondary | ICD-10-CM | POA: Diagnosis not present

## 2021-04-03 DIAGNOSIS — Z79899 Other long term (current) drug therapy: Secondary | ICD-10-CM | POA: Insufficient documentation

## 2021-04-03 DIAGNOSIS — C3410 Malignant neoplasm of upper lobe, unspecified bronchus or lung: Secondary | ICD-10-CM

## 2021-04-03 DIAGNOSIS — Z923 Personal history of irradiation: Secondary | ICD-10-CM | POA: Insufficient documentation

## 2021-04-03 DIAGNOSIS — Z7984 Long term (current) use of oral hypoglycemic drugs: Secondary | ICD-10-CM | POA: Insufficient documentation

## 2021-04-03 NOTE — Progress Notes (Signed)
Radiation Oncology         [phone] ________________________________  Name: [name] [name] MRN: [id number]  Date: 04/03/2021  DOB: 11-28-1946  Follow-Up Visit Note  CC: Shon Baton, MD  Garner Nash, DO    ICD-10-CM   1. Non-small cell cancer of left lung [facility])  C34.92 CT Chest W Contrast  2. 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 No significant interval history since the end of treatment.   On review of systems, he reports feeling well. He denies significant cough or shortness of breath since completion of his treatment.  He did experience some fatigue for approximately 1 week after his treatment but is back to his baseline at this time.  He denies any hemoptysis.  He is scheduled for follow-up with Dr. Loralie Champagne next month                      ALLERGIES:  has No Known Allergies.  Meds: Current Outpatient Medications  Medication Sig Dispense Refill  . amiodarone (PACERONE) 200 MG tablet TAKE 1 TABLET BY MOUTH EVERY DAY 90 tablet 3  . amoxicillin (AMOXIL) 500 MG capsule Take 2,000 mg by mouth See admin instructions. Take 2000 mg by mouth one hour prior to dental procedures    . atorvastatin (LIPITOR) 80 MG tablet Take 80 mg by mouth daily.    . Blood Glucose Monitoring Suppl (ONETOUCH VERIO IQ SYSTEM) w/Device KIT Check blood sugar 2x a day [id number]    . ENTRESTO 24-26 MG TAKE 1 TABLET BY MOUTH TWICE A DAY 60 tablet 6  . Evolocumab 140 MG/ML SOAJ Inject 140 mg into the skin every 14 (fourteen) days.     . furosemide (LASIX) 20 MG tablet Take 2 tablets (40 mg total) by mouth daily. 90 tablet 3  . glyBURIDE (DIABETA) 2.5 MG tablet Take 5 mg by mouth 2 (two) times daily with a meal.    . INVOKANA 100 MG TABS tablet Take 100 mg by mouth daily.    . nitroGLYCERIN (NITROSTAT) 0.4 MG SL tablet Place 1 tablet (0.4 mg total) under the tongue every 5 (five) minutes x 3 doses as needed for chest pain. 25 tablet 12  . pantoprazole (PROTONIX) 40 MG tablet Take 1 tablet (40 mg total) by mouth daily. 30 tablet 11  . sildenafil (VIAGRA) 100 MG tablet Take 1 tablet po q daily prn sex    . spironolactone (ALDACTONE) 25 MG tablet Take 1 tablet (25 mg total) by mouth daily. 30 tablet 3  . warfarin (COUMADIN) 5 MG tablet TAKE 1 TO 1 AND 1/2 TABLETS BY MOUTH DAILY AS PRESCRIBED BY THE COUMADIN CLINIC. 120 tablet 1   No current facility-administered medications for this encounter.    Physical Findings: The patient is in no acute distress. Patient is alert and oriented.  height is 5' 8"  (1.727 m) and weight is 174 lb 9.6 oz (79.2 kg). His temperature is 97.9 F (36.6 C). His blood pressure is 137/55 (abnormal) and his pulse is 76. His respiration is 20  and oxygen saturation is 97%.   Lungs are clear to auscultation bilaterally. Heart has regular rate and rhythm. No palpable cervical, supraclavicular, or axillary adenopathy. Abdomen soft, non-tender, normal bowel sounds.   Lab Findings: Lab Results  Component Value Date   WBC 7.3 12/25/2020   HGB 17.3 (H) 12/25/2020   HCT 53.9 (H) 12/25/2020   MCV 96.8 12/25/2020   PLT 222 12/25/2020    Radiographic Findings: VAS US CAROTID  Result Date: 03/10/2021 Carotid Arterial Duplex Study Indications:       Carotid artery disease and denies any upper extremity                    exertional symptoms. Risk Factors:      Hypertension, hyperlipidemia, Diabetes, past history of                    smoking, coronary artery disease, prior CVA, PAD. Other Factors:     S/p  CABG, mechanical AVR, LIMA.                    Also history of aorto-bifemoral graft, iliac stents, femoral                    endarterectomy, SFA PTA. Comparison Study:  03/07/20 showed RICA velocity 846/96 cm/sec, LICA 295/28                    cm/sec, and elevated bilateral subclavian artery velocities                    (right 319 cm/sec, left 222 cm/sec). Performing Technologist: [employer] BS, RVT, RDCS  Examination Guidelines: A complete evaluation includes B-mode imaging, spectral Doppler, color Doppler, and power Doppler as needed of all accessible portions of each vessel. Bilateral testing is considered an integral part of a complete examination. Limited examinations for reoccurring indications may be performed as noted.  Right Carotid Findings: +----------+--------+--------+--------+--------------------------+---------+           PSV cm/sEDV cm/sStenosisPlaque Description        Comments  +----------+--------+--------+--------+--------------------------+---------+ CCA Prox  74      74                                                  +----------+--------+--------+--------+--------------------------+---------+ CCA Mid   62      62              heterogenous and irregular          +----------+--------+--------+--------+--------------------------+---------+ CCA Distal43      43              heterogenous                        +----------+--------+--------+--------+--------------------------+---------+ ICA Prox  43      7               heterogenous and irregularShadowing +----------+--------+--------+--------+--------------------------+---------+ ICA Mid   120     21      1-39%   heterogenous and irregularShadowing +----------+--------+--------+--------+--------------------------+---------+ ICA Distal39      5  tortuous  +----------+--------+--------+--------+--------------------------+---------+ ECA       138     138              heterogenous and irregular          +----------+--------+--------+--------+--------------------------+---------+ +----------+--------+-------+---------+-------------------+           PSV cm/sEDV cmsDescribe Arm Pressure (mmHG) +----------+--------+-------+---------+-------------------+ [id number]            [id number]                 +----------+--------+-------+---------+-------------------+ +---------+--------+--+--------+--+---------+ VertebralPSV cm/s43EDV cm/s43Antegrade +---------+--------+--+--------+--+---------+ Right brachial artery waveform is triphasic. Left Carotid Findings: +----------+--------+--------+--------+--------------------------+---------+           PSV cm/sEDV cm/sStenosisPlaque Description        Comments  +----------+--------+--------+--------+--------------------------+---------+ CCA Prox  107     107                                                 +----------+--------+--------+--------+--------------------------+---------+ CCA Mid   80      2               heterogenous and irregular          +----------+--------+--------+--------+--------------------------+---------+ CCA Distal60      5               heterogenous                        +----------+--------+--------+--------+--------------------------+---------+ ICA Prox  38      6       1-39%                             Shadowing +----------+--------+--------+--------+--------------------------+---------+ ICA Mid   74      6               heterogenous and irregulartortuous  +----------+--------+--------+--------+--------------------------+---------+ ICA Distal119     18                                        tortuous  +----------+--------+--------+--------+--------------------------+---------+ ECA       116     116             heterogenous and irregular          +----------+--------+--------+--------+--------------------------+---------+  +----------+--------+--------+----------------+-------------------+           PSV cm/sEDV cm/sDescribe        Arm Pressure (mmHG) +----------+--------+--------+----------------+-------------------+ [id number]             Multiphasic, [id number]                 +----------+--------+--------+----------------+-------------------+ +---------+--------+--+--------+--+---------+ VertebralPSV cm/s44EDV cm/s44Antegrade +---------+--------+--+--------+--+---------+ Left brachial artery waveform is triphasic. However, there is an AV fitula associated with the distal brachial artery and vein.  Summary: Right Carotid: Velocities in the right ICA are consistent with a 1-39% stenosis.                Non-hemodynamically significant plaque <50% noted in the CCA.                Heavily calcified with irregular plaque, shadowing and serpentine                distal vessels. Left Carotid: Velocities  in the left ICA are consistent with a 1-39% stenosis.               Non-hemodynamically significant plaque <50% noted in the CCA.               Heavily calcified with irregular plaque, shadowing and serpentine               distal vessels. Vertebrals:  Bilateral vertebral arteries demonstrate antegrade flow. Subclavians: Bilateral subclavian artery flow was disturbed. Left distal              brachial artery to vein AV Fistula noted. *See table(s) above for measurements and observations. Suggest follow up study in 12 months based on plaque abundance. Electronically signed by Ida Rogue MD on 03/10/2021 at 9:20:15 PM.    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  Plan: The patient will follow up with radiation oncology in three months. He will undergo a chest CT scan prior to that visit.   ____________________________________   Blair Promise, PhD, MD  This  document serves as a record of services personally performed by Gery Pray, MD. It was created on his behalf by Clerance Lav, a trained medical scribe. The creation of this record is based on the scribe's personal observations and the provider's statements to them. This document has been checked and approved by the attending provider.

## 2021-04-03 NOTE — Progress Notes (Incomplete)
  Patient Name: [name] [name] MRN: [id number] DOB: 1946/03/27 Referring Physician: June Leap Date of Service: 02/25/2021 [facility],                                                         End Of Treatment Note  Diagnoses: C34.92-Malignant neoplasm of unspecified part of left bronchus or lung  Cancer Staging: Non-small cell carcinoma of the left upper lobe (lingula), squamous cell, stage IA2  Intent: Curative   Radiation Treatment Dates: 02/18/2021 through 02/25/2021  Site: Left lung Technique: IMRT Total Dose (Gy): 54/54 Dose per Fx (Gy): 18 Completed Fx: 3/3 Beam Energies: 6XFFF  Narrative: The patient tolerated radiation therapy relatively well. He did report some fatigue after each treatment that resolved. He denied cough, shortness of breath, difficulty swallowing, and skin irritation. Appetite remained moderate.  Plan: The patient will follow-up with radiation oncology in one month.  ________________________________________________   Blair Promise, PhD, MD  This document serves as a record of services personally performed by Gery Pray, MD. It was created on his behalf by Clerance Lav, a trained medical scribe. The creation of this record is based on the scribe's personal observations and the provider's statements to them. This document has been checked and approved by the attending provider.

## 2021-04-03 NOTE — Progress Notes (Signed)
PAIN: He is currently in no pain.  RESPIRATORY: None. Pt is on room air. Denies skin abnormalities. None.  SWALLOWING/DIET: Pt denies dysphagia. Denies feeling of lump in throat. The patient eats a regular, healthy diet..  OTHER: Pt denies fatigue, weakness, loss of sleep and poor appetite    BP (!) 137/55 (BP Location: Right Arm, Patient Position: Sitting, Cuff Size: Large)   Pulse 76   Temp 97.9 F (36.6 C)   Resp 20   Ht 5\' 8"  (1.727 m)   Wt 174 lb 9.6 oz (79.2 kg)   SpO2 97%   BMI 26.55 kg/m    Wt Readings from Last 3 Encounters:  04/03/21 174 lb 9.6 oz (79.2 kg)  02/19/21 175 lb (79.4 kg)  02/06/21 170 lb (77.1 kg)

## 2021-04-14 ENCOUNTER — Other Ambulatory Visit: Payer: Self-pay

## 2021-04-14 ENCOUNTER — Ambulatory Visit (INDEPENDENT_AMBULATORY_CARE_PROVIDER_SITE_OTHER): Payer: Medicare Other

## 2021-04-14 DIAGNOSIS — Z5181 Encounter for therapeutic drug level monitoring: Secondary | ICD-10-CM | POA: Diagnosis not present

## 2021-04-14 DIAGNOSIS — I359 Nonrheumatic aortic valve disorder, unspecified: Secondary | ICD-10-CM | POA: Diagnosis not present

## 2021-04-14 DIAGNOSIS — G453 Amaurosis fugax: Secondary | ICD-10-CM

## 2021-04-14 LAB — POCT INR: INR: 4.5 — AB (ref 2.0–3.0)

## 2021-04-14 NOTE — Patient Instructions (Signed)
-   since you've taken your warfarin today, HAVE A SERVING OF [place] TODAY - skip warfarin tomorrow, then  - resume 1 tablet daily except for 1.5 tablets on Sundays and Thursdays.  - Recheck INR in 3 weeks.   Coumadin Clinic for any changes in medications or upcoming procedures. [phone]

## 2021-04-17 ENCOUNTER — Other Ambulatory Visit (HOSPITAL_COMMUNITY): Payer: Self-pay | Admitting: Cardiology

## 2021-04-21 ENCOUNTER — Other Ambulatory Visit: Payer: Self-pay

## 2021-04-21 ENCOUNTER — Encounter (HOSPITAL_COMMUNITY): Payer: Self-pay | Admitting: Cardiology

## 2021-04-21 ENCOUNTER — Ambulatory Visit (HOSPITAL_COMMUNITY)
Admission: RE | Admit: 2021-04-21 | Discharge: 2021-04-21 | Disposition: A | Discharge status: Home / Self Care | Payer: Medicare Other | Source: Ambulatory Visit | Attending: Cardiology | Admitting: Cardiology

## 2021-04-21 VITALS — BP 110/60 | HR 72 | Wt 174.4 lb

## 2021-04-21 DIAGNOSIS — I472 Ventricular tachycardia: Secondary | ICD-10-CM | POA: Insufficient documentation

## 2021-04-21 DIAGNOSIS — Z8249 Family history of ischemic heart disease and other diseases of the circulatory system: Secondary | ICD-10-CM | POA: Diagnosis not present

## 2021-04-21 DIAGNOSIS — Z7901 Long term (current) use of anticoagulants: Secondary | ICD-10-CM | POA: Insufficient documentation

## 2021-04-21 DIAGNOSIS — Z7984 Long term (current) use of oral hypoglycemic drugs: Secondary | ICD-10-CM | POA: Insufficient documentation

## 2021-04-21 DIAGNOSIS — Z951 Presence of aortocoronary bypass graft: Secondary | ICD-10-CM | POA: Insufficient documentation

## 2021-04-21 DIAGNOSIS — I110 Hypertensive heart disease with heart failure: Secondary | ICD-10-CM | POA: Diagnosis not present

## 2021-04-21 DIAGNOSIS — Z79899 Other long term (current) drug therapy: Secondary | ICD-10-CM | POA: Insufficient documentation

## 2021-04-21 DIAGNOSIS — Z955 Presence of coronary angioplasty implant and graft: Secondary | ICD-10-CM | POA: Diagnosis not present

## 2021-04-21 DIAGNOSIS — I252 Old myocardial infarction: Secondary | ICD-10-CM | POA: Diagnosis not present

## 2021-04-21 DIAGNOSIS — I480 Paroxysmal atrial fibrillation: Secondary | ICD-10-CM | POA: Diagnosis not present

## 2021-04-21 DIAGNOSIS — Z8673 Personal history of transient ischemic attack (TIA), and cerebral infarction without residual deficits: Secondary | ICD-10-CM | POA: Diagnosis not present

## 2021-04-21 DIAGNOSIS — I5032 Chronic diastolic (congestive) heart failure: Secondary | ICD-10-CM | POA: Diagnosis not present

## 2021-04-21 DIAGNOSIS — I2510 Atherosclerotic heart disease of native coronary artery without angina pectoris: Secondary | ICD-10-CM | POA: Diagnosis not present

## 2021-04-21 DIAGNOSIS — E119 Type 2 diabetes mellitus without complications: Secondary | ICD-10-CM | POA: Insufficient documentation

## 2021-04-21 DIAGNOSIS — E785 Hyperlipidemia, unspecified: Secondary | ICD-10-CM | POA: Insufficient documentation

## 2021-04-21 DIAGNOSIS — I255 Ischemic cardiomyopathy: Secondary | ICD-10-CM | POA: Diagnosis not present

## 2021-04-21 DIAGNOSIS — I5042 Chronic combined systolic (congestive) and diastolic (congestive) heart failure: Secondary | ICD-10-CM | POA: Insufficient documentation

## 2021-04-21 DIAGNOSIS — Z952 Presence of prosthetic heart valve: Secondary | ICD-10-CM | POA: Insufficient documentation

## 2021-04-21 DIAGNOSIS — Z87891 Personal history of nicotine dependence: Secondary | ICD-10-CM | POA: Diagnosis not present

## 2021-04-21 LAB — LIPID PANEL
Cholesterol: 144 mg/dL (ref 0–200)
HDL: 74 mg/dL (ref >40)
LDL Cholesterol: 40 mg/dL (ref 0–99)
Total CHOL/HDL Ratio: 1.9 RATIO
Triglycerides: 148 mg/dL (ref <150)
VLDL: 30 mg/dL (ref 0–40)

## 2021-04-21 LAB — COMPREHENSIVE METABOLIC PANEL
ALT: 21 U/L (ref 0–44)
AST: 23 U/L (ref 15–41)
Albumin: 3.8 g/dL (ref 3.5–5.0)
Alkaline Phosphatase: 62 U/L (ref 38–126)
Anion gap: 7 (ref 5–15)
BUN: 23 mg/dL (ref 8–23)
CO2: 29 mmol/L (ref 22–32)
Calcium: 9.4 mg/dL (ref 8.9–10.3)
Chloride: 103 mmol/L (ref 98–111)
Creatinine, Ser: 1.11 mg/dL (ref 0.61–1.24)
GFR, Estimated: >60 mL/min (ref >60)
Glucose, Bld: 265 mg/dL — ABNORMAL HIGH (ref 70–99)
Potassium: 4.8 mmol/L (ref 3.5–5.1)
Sodium: 139 mmol/L (ref 135–145)
Total Bilirubin: 1 mg/dL (ref 0.3–1.2)
Total Protein: 6.8 g/dL (ref 6.5–8.1)

## 2021-04-21 LAB — TSH: TSH: 2.011 u[IU]/mL (ref 0.350–4.500)

## 2021-04-21 MED ORDER — AMIODARONE HCL 200 MG PO TABS: 100.0000 mg | ORAL_TABLET | Freq: Every day | ORAL | 3 refills | Status: DC

## 2021-04-21 NOTE — Patient Instructions (Addendum)
Decrease Amiodarone to 100 mg (1/2 tab) Daily  Labs done today, your results will be available in MyChart, we will contact you for abnormal readings.  Please call our office in October to schedule your follow up appointment  If you have any questions or concerns before your next appointment please send [place] a message through [place] or call our office at [phone].    TO LEAVE A MESSAGE FOR THE NURSE SELECT OPTION 2, PLEASE LEAVE A MESSAGE INCLUDING: . YOUR NAME . DATE OF BIRTH . CALL BACK NUMBER . REASON FOR CALL**this is important as we prioritize the call backs  [place] AS LONG AS YOU CALL BEFORE 4:00 PM  At the [facility], you and your health needs are our priority. As part of our continuing mission to provide you with exceptional heart care, we have created designated Provider Care Teams. These Care Teams include your primary Cardiologist (physician) and Advanced Practice Providers (APPs- Physician Assistants and Nurse Practitioners) who all work together to provide you with the care you need, when you need it.   You may see any of the following providers on your designated Care Team at your next follow up: [name] Dr Glori Bickers . Dr Loralie Champagne . Dr Vickki Muff . Darrick Grinder, NP . Lyda Jester, [place] . Audry Riles, PharmD   Please be sure to bring in all your medications bottles to every appointment.

## 2021-04-21 NOTE — Progress Notes (Signed)
PCP: Shon Baton, MD HF Cardiology: Dr. Velora Heckler a 75 y.o.malewith a history of CAD s/p CABG, mechanical AVR on coumadin, extensive PAD, DM, HTN, HLD, ITP, TIA, and recurrent GI bleed. Note, requires lovenox bridging off heparin.  Dr. Gwenlyn Found follows him for extensive PAD. He has had an aortobifem bypass in 2016 with follow up iliac stenting and femoral endarterectomy. In 2017, he had subsequent ostial and mid right SFA intervention. Hx of left renal artery stenting in 2016 with repeat intervention on left for 75% in-stent restenosis, with progression of disease on the right renal artery showing 60% stenosis on duplex 07/12/19. Echo 09/17/19 with normal EF of 60-65% and normal AVR function; chronic diastolic heart failure.   Admitted in 10/20 with chest pain. He underwent heart cath 09/18/19 that showed 99% left main stenosis and patent LIMA to occluded LAD and a patent sequential vein to the PDA and PLA of an occluded dominant right. His left main stenosis jeopardized the moderately large ramus and high first marginal and nondominant Cx which has 90% mid AV groove Cx stenosis. Aggressive medical therapy was recommended initially.  He was readmitted in 10/20 with CHF and chest pain. Hospital course complicated by A flutter/low output heart failure.  S/P successful orbital atherectomy, PTCA, and stenting of protected left main with a 4.0x15 mm resolute onyx DES 11/3. LIMA-LAD patent. SVG-PDA patent. ECHO this admission showed EF 30-35%, mechanical AoV ok. TEE 11/9 w/ improved EF, 40-45%. Placed on milrinone initially with low co-ox and later weaned off. He was cardioverted from atrial flutter back to NSR.   2/21 peripheral arterial dopplers showed 75-99% stenosis right SFA.  In 3/21, he had stenting to right SFA.    Echo in 6/21 showed EF up to 55-60%.    In 12/21, he had an atrial fibrillation ablation.   He was admitted in 1/22 with shortness of breath and was found to be  in atrial fibrillation with RVR. He was hypertensive.  Troponin was negative. He was diuresed and discharged, cardiology was not notified.  Echo during this admission showed EF 60-65%, normal RV size and systolic function, stable mechanical aortic valve.   During the 1/22 admission, CT chest showed a lung nodule.  Followup PET-CT suggestive of lung cancer, biopsy with squamous cell lung cancer.  He underwent radiation therapy, now completed.   DCCV to NSR in 1/22.   He returns today for CHF followup.  He is in NSR today.   Doing well, denies palpitations.  Energy returning as he gets further out from radiation.  Rare atypical chest pain.  No significant exertional dyspnea.  No lightheadedness.  He plays golf and goes to the [facility] for exercise. No claudication.   ECG (personally reviewed): NSR, nonspecific T wave changes.   Labs (11/20): K 4.3, creatinine 1.3, hgb 12.5 Labs (12/20): LDL 56, HDL 80, TGs 179 Labs (1/21): K 4.8, creatinine 1.45 Labs (4/21): K 5.2, creatinine 1.34 Labs (6/21): LDL 44 Labs (1/22): K 4, creatinine 1.3 Labs (3/22): K 4.4, creatinine 1.02  PMH  1. CAD:  S/P CABG (3/04) with LIMA-LAD, seq SVG-PDA/PLV.  - NSTEMI 10/20 with cath showing patent LIMA-LAD and SVG-PDA/PLV but 99% LM stenosis, occluded LAD, moderate ramus and high OM1 jeopardized, also 90% mid AV groove LCx stenosis.  Occluded RCA.  Patient ultimately had orbital atherectomy and stenting of left main with a 4.0 x 15 mm Resolute Onyx DES.  2. Mechanical aortic valve: TEE 11/20 showed stable-appearing mechanical valve, mean  gradient 9 mmHg.  3. PAD: Aortobifemoral bypass in 2016 with follow up iliac stenting and femoral endarterectomy. In 2017, he had subsequent ostial and mid right SFA intervention. - Angiography 10/20 with 80% in-stent restenosis in proximal right SFA.  - Peripheral arterial dopplers (2/21) with 75-99% R SFA stenosis in prior stented area.  - 3/21 Right SFA stent (Dr. Gwenlyn Found).  - ABIs (1/22):  Stable mild left SFA disease.  4. Type 2 diabetes.  5. HTN - Renal artery dopplers (8/21): No significant stenosis.  6. Hyperlipidemia 7. H/o ITP  8. TIA  9. Chronic Systolic HF: Ischemic cardiomyopathy.  - Echo (10/20): EF 30-35%, mechanical AoV ok.  - TEE (11/20): w/ improved EF, 40-45%. Mechanical aortic valve with mean gradient 9 mmHg, normal RV.  - Echo (6/21): EF 55-60%, mild LVH, mechanical AoV with mean gradient 9 mmHg.  - Echo (1/22): EF 60-65%, normal RV size and systolic function, stable mechanical aortic valve.  10. Atrial fibrillation: Paroxysmal.  - Atrial fibrillation ablation 12/21.  11. Renal artery stenosis: Hx of left renal artery stenting in 2016 with repeat intervention on left for 75% in-stent restenosis, with progression of disease on the right renal artery showing 60% stenosis on duplex 07/12/19. 12. Carotid stenosis: 4/21 carotid dopplers with right subclavian stenosis, 40-59% RICA stenosis.  - Carotid dopplers (4/22): 1-39% BICA stenosis.  13. Suspected lung cancer: PET scan 1/22 suggestive of early lung cancer.   ROS: All systems negative except as listed in HPI, PMH and Problem List.  Social History   Socioeconomic History  . Marital status: Divorced    Spouse name: Not on file  . Number of children: 3  . Years of education: Not on file  . Highest education level: Not on file  Occupational History  . Occupation: Retired  Tobacco Use  . Smoking status: Former Smoker    Years: 30.00    Types: Cigarettes, Cigars    Quit date: 12/15/1993    Years since quitting: 27.3  . Smokeless tobacco: Never Used  . Tobacco comment: occasional cigar  Vaping Use  . Vaping Use: Never used  Substance and Sexual Activity  . Alcohol use: Yes    Alcohol/week: 16.0 standard drinks    Types: 1 Cans of beer, 1 Shots of liquor, 14 Standard drinks or equivalent per week    Comment: drinks 2 martini's a night (2 shots in each)  . Drug use: No  . Sexual activity: Yes  Other  Topics Concern  . Not on file  Social History Narrative   Tries to remain active.  Frequent golfer but claudication limits this.   Social Determinants of Health   Financial Resource Strain: Not on file  Food Insecurity: Not on file  Transportation Needs: Not on file  Physical Activity: Not on file  Stress: Not on file  Social Connections: Not on file  Intimate Partner Violence: Not on file   Family History  Problem Relation Age of Onset  . Coronary artery disease Mother        bypass surgery - deceased  . Heart disease Father        murmur, valve replacement - deceased  . Breast cancer Sister   . Diabetes Other        grandmother  . Diabetes Paternal Grandmother   . Diabetes Paternal Aunt   . Colon cancer Neg Hx   . Colon polyps Neg Hx   . Esophageal cancer Neg Hx   . Rectal cancer Neg Hx   .  Stomach cancer Neg Hx     Current Outpatient Medications  Medication Sig Dispense Refill  . amoxicillin (AMOXIL) 500 MG capsule Take 2,000 mg by mouth See admin instructions. Take 2000 mg by mouth one hour prior to dental procedures    . atorvastatin (LIPITOR) 80 MG tablet Take 80 mg by mouth daily.    . Blood Glucose Monitoring Suppl (ONETOUCH VERIO IQ SYSTEM) w/Device KIT Check blood sugar 2x a day Dx-E11.51    . ENTRESTO 24-26 MG TAKE 1 TABLET BY MOUTH TWICE A DAY 60 tablet 6  . Evolocumab 140 MG/ML SOAJ Inject 140 mg into the skin every 14 (fourteen) days.    . furosemide (LASIX) 20 MG tablet Take 2 tablets (40 mg total) by mouth daily. 90 tablet 3  . glyBURIDE (DIABETA) 2.5 MG tablet Take 5 mg by mouth 2 (two) times daily with a meal.    . INVOKANA 100 MG TABS tablet Take 100 mg by mouth daily.    . nitroGLYCERIN (NITROSTAT) 0.4 MG SL tablet Place 1 tablet (0.4 mg total) under the tongue every 5 (five) minutes x 3 doses as needed for chest pain. 25 tablet 12  . pantoprazole (PROTONIX) 40 MG tablet Take 1 tablet (40 mg total) by mouth daily. 30 tablet 11  . sildenafil (VIAGRA)  100 MG tablet Take 1 tablet po q daily prn sex    . spironolactone (ALDACTONE) 25 MG tablet TAKE 1 TABLET BY MOUTH EVERY DAY 90 tablet 3  . warfarin (COUMADIN) 5 MG tablet TAKE 1 TO 1 AND 1/2 TABLETS BY MOUTH DAILY AS PRESCRIBED BY THE COUMADIN CLINIC. 120 tablet 1  . amiodarone (PACERONE) 200 MG tablet Take 0.5 tablets (100 mg total) by mouth daily. 90 tablet 3   No current facility-administered medications for this encounter.    Vitals:   04/21/21 1026  BP: 110/60  Pulse: 72  SpO2: 94%  Weight: 79.1 kg (174 lb 6.4 oz)   Wt Readings from Last 3 Encounters:  04/21/21 79.1 kg (174 lb 6.4 oz)  04/03/21 79.2 kg (174 lb 9.6 oz)  02/19/21 79.4 kg (175 lb)    PHYSICAL EXAM: General: NAD Neck: No JVD, no thyromegaly or thyroid nodule.  Lungs: Clear to auscultation bilaterally with normal respiratory effort. CV: Nondisplaced PMI.  Heart regular S1/S2 with mechanical S2, no S3/S4, 1/6 SEM RUSB.  No peripheral edema.  Right carotid bruit.  Normal pedal pulses.  Abdomen: Soft, nontender, no hepatosplenomegaly, no distention.  Skin: Intact without lesions or rashes.  Neurologic: Alert and oriented x 3.  Psych: Normal affect. Extremities: No clubbing or cyanosis.  HEENT: Normal.    ASSESSMENT & PLAN: 1. Chronic Systolic => Diastolic CHF: Ischemic cardiomyopathy. Echo 10/06/19 showed EF 30-35% and wall motion abnormalities, prior echo earlier in 10/20 showed EF 50-55%. Cath 10/20 showed jeopardized ramus and LCx territory (99% distal left main, occluded LAD, patent LIMA-LAD). Concerned that ischemia in this territory triggered CHF, worsening of LV function.  Now s/p DES to left main, TEE in 11/20 after intervention showed EF higher at 40-45%.  Echo in 1/22 showed EF 60-65% with normal RV.  NYHA class II symptoms, not significantly volume overloaded.  - Continue Entresto 24/26 bid.  BMET today.   - Continue Lasix 40 mg daily.    - Continue spironolactone 25 mg daily.  BMET today.  - He is on  canagliflozin.    2. CAD: Complicated disease, coronary angiography 10/20 showed patent SVG-RCA territory and patent LIMA-LAD. The proximal  LAD was occluded and there was 99% distal left main stenosis. This left the ramus and LCx in jeopardy. He was not a good candidate for protected PCI =>cannot place Impella with mechanical aortic valve and peripheral vascular disease likely precludes a femoral IABP. It was decided to try to manage him medically. However, he returned with progressive chest pain episodes and NSTEMI, hs-TnI up to 935. S/p successful orbital atherectomy and stenting of the left main with a 4.0x15 mm Resolute Onyx DES 10/10/19.Rare atypical chest pain.    -  I will not start him on ASA 81 as he has had GI bleeding x 2 when ASA was added to warfarin in the past.  - Continue warfarin for mechanical valve.    - Continue atorvastatin 80 daily + Repatha.  Check lipids today.   3. Mechanical aortic valve: Stable on 1/22 echo. Crisp mechanical sounds on exam - I will not use ASA given GI bleeding on combination of ASA and warfarin.  - Continue warfarin.   4. NSVT: Noted 10/20 admission, on amiodarone.  5. H/o GI bleeding: Continue PPI for GI protection. 6. PAD: Patient with aorto-bifemoral bypass, h/o iliac stenting, h/o femoral endarterectomy, PCI to right SFA in 2017.  Angiography in 10/20 with 80% ISR in right SFA.  He had PCI to right SFA in 3/21, no claudication now.  Repeat ABIs in 1/22 with mild left SFA disease.  7. Carotid/subclavian stenosis: Mild disease on dopplers in 4/22.  8. DM2: He is on SGLT2 inhibitor (canagliflozin).    10. Atrial fibrillation/atypical flutter:  TEE-guided DCCV in 11/20, atrial fibrillation ablation in 12/21.  Admitted with CHF exacerbation in 1/22 in the setting of recurrence of atrial fibrillation.  Atypical flutter with DCCV in 1/22. He is in NSR today.  - Decrease amiodarone to 100 mg daily. Check LFTs, TSH today.  He will need a regular eye exam.   If he stays in NSR at followup, can stop amiodarone.  - Continue coumadin.   Followup 6 months.   Loralie Champagne  04/21/2021

## 2021-04-22 ENCOUNTER — Ambulatory Visit: Payer: Medicare Other | Admitting: Podiatry

## 2021-04-29 DIAGNOSIS — Z20822 Contact with and (suspected) exposure to covid-19: Secondary | ICD-10-CM | POA: Diagnosis not present

## 2021-05-01 DIAGNOSIS — I1310 Hypertensive heart and chronic kidney disease without heart failure, with stage 1 through stage 4 chronic kidney disease, or unspecified chronic kidney disease: Secondary | ICD-10-CM | POA: Diagnosis not present

## 2021-05-01 DIAGNOSIS — I5032 Chronic diastolic (congestive) heart failure: Secondary | ICD-10-CM | POA: Diagnosis not present

## 2021-05-01 DIAGNOSIS — R059 Cough, unspecified: Secondary | ICD-10-CM | POA: Diagnosis not present

## 2021-05-01 DIAGNOSIS — N1831 Chronic kidney disease, stage 3a: Secondary | ICD-10-CM | POA: Diagnosis not present

## 2021-05-01 DIAGNOSIS — U071 COVID-19: Secondary | ICD-10-CM | POA: Diagnosis not present

## 2021-05-01 DIAGNOSIS — Z1152 Encounter for screening for COVID-19: Secondary | ICD-10-CM | POA: Diagnosis not present

## 2021-05-02 ENCOUNTER — Ambulatory Visit: Payer: Medicare Other | Admitting: Podiatry

## 2021-05-06 ENCOUNTER — Encounter: Payer: Self-pay | Admitting: Podiatry

## 2021-05-06 ENCOUNTER — Ambulatory Visit (INDEPENDENT_AMBULATORY_CARE_PROVIDER_SITE_OTHER): Payer: Medicare Other | Admitting: Podiatry

## 2021-05-06 ENCOUNTER — Ambulatory Visit (INDEPENDENT_AMBULATORY_CARE_PROVIDER_SITE_OTHER): Payer: Medicare Other

## 2021-05-06 ENCOUNTER — Other Ambulatory Visit: Payer: Self-pay

## 2021-05-06 DIAGNOSIS — Z5181 Encounter for therapeutic drug level monitoring: Secondary | ICD-10-CM

## 2021-05-06 DIAGNOSIS — D689 Coagulation defect, unspecified: Secondary | ICD-10-CM

## 2021-05-06 DIAGNOSIS — B351 Tinea unguium: Secondary | ICD-10-CM | POA: Diagnosis not present

## 2021-05-06 DIAGNOSIS — I359 Nonrheumatic aortic valve disorder, unspecified: Secondary | ICD-10-CM | POA: Diagnosis not present

## 2021-05-06 LAB — POCT INR: INR: 2 (ref 2.0–3.0)

## 2021-05-06 NOTE — Patient Instructions (Signed)
-   take extra 1/2 tablet warfarin today, then - resume 1 tablet daily except for 1.5 tablets on Sundays and Thursdays.  - Recheck INR in 3 weeks.   Coumadin Clinic for any changes in medications or upcoming procedures. [phone]

## 2021-05-06 NOTE — Progress Notes (Signed)
  Subjective:  Patient ID: [name] [name], male    DOB: March 17, 1946,  MRN: [id number]  Chief Complaint  Patient presents with  . Nail Problem    I am here to get the toenails trimmed     75 y.o. male presents with the above complaint. History confirmed with patient. On chronic anticoagulant  Objective:  Physical Exam: warm, good capillary refill, no trophic changes or ulcerative lesions, normal DP and PT pulses and normal sensory exam. Nails thickened, elongated.   Assessment:   1. Onychomycosis   2. Coagulation defect [facility])    Plan:  Patient was evaluated and treated and all questions answered.  Onychomycosis and Coagulation Defect -Nails palliatively debrided secondary to pain  Procedure: Nail Debridement Type of Debridement: manual, sharp debridement. Instrumentation: Nail nipper, rotary burr. Number of Nails: 10   No follow-ups on file.

## 2021-05-23 DIAGNOSIS — M542 Cervicalgia: Secondary | ICD-10-CM | POA: Diagnosis not present

## 2021-05-23 DIAGNOSIS — G589 Mononeuropathy, unspecified: Secondary | ICD-10-CM | POA: Diagnosis not present

## 2021-05-27 ENCOUNTER — Other Ambulatory Visit: Payer: Self-pay

## 2021-05-27 ENCOUNTER — Ambulatory Visit (INDEPENDENT_AMBULATORY_CARE_PROVIDER_SITE_OTHER): Payer: Medicare Other

## 2021-05-27 DIAGNOSIS — G459 Transient cerebral ischemic attack, unspecified: Secondary | ICD-10-CM | POA: Diagnosis not present

## 2021-05-27 DIAGNOSIS — Z5181 Encounter for therapeutic drug level monitoring: Secondary | ICD-10-CM

## 2021-05-27 DIAGNOSIS — I359 Nonrheumatic aortic valve disorder, unspecified: Secondary | ICD-10-CM | POA: Diagnosis not present

## 2021-05-27 LAB — POCT INR: INR: 3.7 — AB (ref 2.0–3.0)

## 2021-05-27 NOTE — Patient Instructions (Signed)
Description   Skip tomorrow's dosage of Warfarin, then resume same dosage 1 tablet daily except for 1.5 tablets on Sundays and Thursdays.  Recheck INR in 2 weeks.  Coumadin Clinic for any changes in medications or upcoming procedures. [phone]

## 2021-05-28 ENCOUNTER — Other Ambulatory Visit (HOSPITAL_COMMUNITY): Payer: Self-pay | Admitting: Cardiology

## 2021-06-13 ENCOUNTER — Other Ambulatory Visit: Payer: Self-pay

## 2021-06-13 ENCOUNTER — Ambulatory Visit (INDEPENDENT_AMBULATORY_CARE_PROVIDER_SITE_OTHER): Payer: Medicare Other | Admitting: Pharmacist

## 2021-06-13 DIAGNOSIS — I359 Nonrheumatic aortic valve disorder, unspecified: Secondary | ICD-10-CM

## 2021-06-13 DIAGNOSIS — G459 Transient cerebral ischemic attack, unspecified: Secondary | ICD-10-CM

## 2021-06-13 DIAGNOSIS — Z5181 Encounter for therapeutic drug level monitoring: Secondary | ICD-10-CM | POA: Diagnosis not present

## 2021-06-13 LAB — POCT INR: INR: 3.7 — AB (ref 2.0–3.0)

## 2021-06-13 NOTE — Patient Instructions (Signed)
Description   Skip tomorrow's dosage of Warfarin, then start new dosage of 1 tablet daily except for 1.5 tablets on Thursdays.  Recheck INR in 2 weeks.  Coumadin Clinic for any changes in medications or upcoming procedures. [phone]

## 2021-06-19 ENCOUNTER — Other Ambulatory Visit (HOSPITAL_COMMUNITY): Payer: Self-pay | Admitting: *Deleted

## 2021-06-19 MED ORDER — FUROSEMIDE 20 MG PO TABS: 40.0000 mg | ORAL_TABLET | Freq: Every day | ORAL | 3 refills | Status: DC

## 2021-06-26 ENCOUNTER — Other Ambulatory Visit: Payer: Self-pay

## 2021-06-26 ENCOUNTER — Ambulatory Visit (INDEPENDENT_AMBULATORY_CARE_PROVIDER_SITE_OTHER): Payer: Medicare Other

## 2021-06-26 DIAGNOSIS — Z5181 Encounter for therapeutic drug level monitoring: Secondary | ICD-10-CM

## 2021-06-26 DIAGNOSIS — G459 Transient cerebral ischemic attack, unspecified: Secondary | ICD-10-CM | POA: Diagnosis not present

## 2021-06-26 DIAGNOSIS — I359 Nonrheumatic aortic valve disorder, unspecified: Secondary | ICD-10-CM

## 2021-06-26 LAB — POCT INR: INR: 2.4 (ref 2.0–3.0)

## 2021-06-26 NOTE — Patient Instructions (Signed)
Description   Take 1.5 tablets today and tomorrow, then resume same dosage 1 tablet daily except for 1.5 tablets on Thursdays.  Recheck INR in 3 weeks.  Coumadin Clinic for any changes in medications or upcoming procedures. [phone]

## 2021-07-01 ENCOUNTER — Ambulatory Visit (INDEPENDENT_AMBULATORY_CARE_PROVIDER_SITE_OTHER): Payer: Medicare Other | Admitting: Podiatry

## 2021-07-01 ENCOUNTER — Ambulatory Visit (INDEPENDENT_AMBULATORY_CARE_PROVIDER_SITE_OTHER): Payer: Medicare Other

## 2021-07-01 ENCOUNTER — Other Ambulatory Visit: Payer: Self-pay

## 2021-07-01 ENCOUNTER — Encounter: Payer: Self-pay | Admitting: Podiatry

## 2021-07-01 DIAGNOSIS — S9032XA Contusion of left foot, initial encounter: Secondary | ICD-10-CM

## 2021-07-01 DIAGNOSIS — I6529 Occlusion and stenosis of unspecified carotid artery: Secondary | ICD-10-CM

## 2021-07-01 DIAGNOSIS — S93525A Sprain of metatarsophalangeal joint of left lesser toe(s), initial encounter: Secondary | ICD-10-CM | POA: Diagnosis not present

## 2021-07-01 DIAGNOSIS — S92502A Displaced unspecified fracture of left lesser toe(s), initial encounter for closed fracture: Secondary | ICD-10-CM | POA: Diagnosis not present

## 2021-07-01 NOTE — Progress Notes (Signed)
He presents today states that he stumbled and fell with his toes ending up underneath his foot date of injury was 06/19/2021 states that the toes were bent back.  Hard.  He states that the foot has been sore particularly when he has been playing golf.  Objective: Vital signs are stable he is alert and oriented x3.  Left foot demonstrates mild erythema around the dorsal aspect and dorsal lateral aspect fourth fifth metatarsal phalangeal joint.  He has ecchymosis to the second toe and third toe tenderness on palpation to all of the lesser toes but the fourth and fifth toes seem to be the most painful.  He has overlapping congenital fifth toes bilateral left greater than right.  No open lesions or wounds or excoriations.  Radiographs taken today demonstrate what appears to be a fracture to the fourth toe nondisplaced not comminuted.  I think he has a sprain to the capsule of the fifth metatarsophalangeal joint.  Mild osteopenia is present.  I do not see any other fractures nothing appears to be dislocated.  Assessment fracture fourth toe sprain fifth toe.  Plan: Offered him a Darco shoe he declined.  I did recommend a stiff soled shoe and expressed to him that it would take several weeks or months even for this to resolve.  He understands it is amenable to it just wanted to make sure that there was nothing that had to be done.

## 2021-07-02 ENCOUNTER — Ambulatory Visit (HOSPITAL_BASED_OUTPATIENT_CLINIC_OR_DEPARTMENT_OTHER)
Admission: RE | Admit: 2021-07-02 | Discharge: 2021-07-02 | Disposition: A | Discharge status: Home / Self Care | Payer: Medicare Other | Source: Ambulatory Visit | Attending: Radiation Oncology | Admitting: Radiation Oncology

## 2021-07-02 ENCOUNTER — Encounter (HOSPITAL_BASED_OUTPATIENT_CLINIC_OR_DEPARTMENT_OTHER): Payer: Self-pay | Admitting: Radiology

## 2021-07-02 DIAGNOSIS — I700 Atherosclerosis of aorta: Secondary | ICD-10-CM | POA: Diagnosis not present

## 2021-07-02 DIAGNOSIS — C3490 Malignant neoplasm of unspecified part of unspecified bronchus or lung: Secondary | ICD-10-CM | POA: Diagnosis not present

## 2021-07-02 DIAGNOSIS — R911 Solitary pulmonary nodule: Secondary | ICD-10-CM | POA: Diagnosis not present

## 2021-07-02 DIAGNOSIS — C3492 Malignant neoplasm of unspecified part of left bronchus or lung: Secondary | ICD-10-CM | POA: Diagnosis not present

## 2021-07-02 DIAGNOSIS — J439 Emphysema, unspecified: Secondary | ICD-10-CM | POA: Diagnosis not present

## 2021-07-02 LAB — POCT I-STAT CREATININE: Creatinine, Ser: 1 mg/dL (ref 0.61–1.24)

## 2021-07-02 MED ORDER — IOHEXOL 350 MG/ML SOLN
60.0000 mL | Freq: Once | INTRAVENOUS | Status: AC | PRN
  Administered 2021-07-02: 60 mL via INTRAVENOUS

## 2021-07-05 NOTE — Progress Notes (Signed)
Radiation Oncology         [phone] ________________________________  Name: [name] [name] MRN: [id number]  Date: 07/07/2021  DOB: August 10, 1946  Follow-Up Visit Note  CC: Shon Baton, MD  Garner Nash, DO    ICD-10-CM   1. Squamous cell carcinoma of left lung (HCC)  C34.92 BUN & Creatinine ([facility])    CT Chest W Contrast    2. Cancer of lingula of lung (HCC)  C34.10       Diagnosis: Non-small cell carcinoma of the left upper lobe (lingula), squamous cell, clinical stage IA2  Interval Since Last Radiation: 4 months and 10 days   Radiation Treatment Dates: 02/18/2021 through 02/25/2021   Site: Left lung Technique: Stereotactic body radiation therapy (SBRT) Total Dose (Gy): 54/54 Dose per Fx (Gy): 18 Completed Fx: 3/3 Beam Energies: 6XFFF  Narrative:  The patient returns today for routine follow-up, the patient was last seen here on 04/03/21.  Since then, the patient received a chest CT on 07/02/21 demonstrating interval post treatment change of the previously visualized pulmonary nodule of the posterior lingua; now with radiation fibrosis and consolidation adjacent to the fiducial markers. Of note: the previously identified nodule is no longer distinctly visualized. Also seen was the unchanged appearance of the prominent right paratracheal lymph node, noted to not be previously PET avid.      The patient otherwise has had no significant interval history since he was last seen.  He denies any pain within the chest area cough or hemoptysis.  He denies any breathing issues.                   Allergies:  is allergic to plavix [clopidogrel].  Meds: Current Outpatient Medications  Medication Sig Dispense Refill   amiodarone (PACERONE) 200 MG tablet Take 0.5 tablets (100 mg total) by mouth daily. 90 tablet 3   amoxicillin (AMOXIL) 500 MG capsule Take 2,000 mg by mouth See admin instructions. Take 2000 mg by mouth one hour prior to dental procedures     atorvastatin  (LIPITOR) 80 MG tablet Take 80 mg by mouth daily.     Blood Glucose Monitoring Suppl (ONETOUCH VERIO IQ SYSTEM) w/Device KIT Check blood sugar 2x a day Dx-E11.51     ENTRESTO 24-26 MG TAKE 1 TABLET BY MOUTH TWICE A DAY 60 tablet 6   Evolocumab 140 MG/ML SOAJ Inject 140 mg into the skin every 14 (fourteen) days.     furosemide (LASIX) 20 MG tablet Take 2 tablets (40 mg total) by mouth daily. 90 tablet 3   glucose blood (ONETOUCH VERIO) test strip use 1 strip qd to check cbg Dx:  E11.51     glyBURIDE (DIABETA) 2.5 MG tablet Take 5 mg by mouth 2 (two) times daily with a meal.     INVOKANA 100 MG TABS tablet Take 100 mg by mouth daily.     nitroGLYCERIN (NITROSTAT) 0.4 MG SL tablet Place 1 tablet (0.4 mg total) under the tongue every 5 (five) minutes x 3 doses as needed for chest pain. 25 tablet 12   OneTouch Delica Lancets 71I MISC Check blood sugar 2x a day Dx-E11.51     pantoprazole (PROTONIX) 40 MG tablet Take 1 tablet (40 mg total) by mouth daily. 30 tablet 11   spironolactone (ALDACTONE) 25 MG tablet TAKE 1 TABLET BY MOUTH EVERY DAY 90 tablet 3   warfarin (COUMADIN) 5 MG tablet TAKE 1 TO 1 AND 1/2 TABLETS BY MOUTH DAILY AS PRESCRIBED BY THE  COUMADIN CLINIC. 120 tablet 1   sildenafil (VIAGRA) 100 MG tablet Take 1 tablet po q daily prn sex (Patient not taking: Reported on 07/07/2021)     No current facility-administered medications for this encounter.    Physical Findings: The patient is in no acute distress. Patient is alert and oriented.  height is $RemoveB'5\' 8"'pCAkVJLx$  (1.727 m) and weight is 175 lb 4 oz (79.5 kg). His temporal temperature is 97 F (36.1 C) (abnormal). His blood pressure is 178/74 (abnormal) and his pulse is 70. His respiration is 18 and oxygen saturation is 95%. .  No significant changes. Lungs are clear to auscultation bilaterally. Heart has regular rate and rhythm.  Artificial valve sounds noted, no palpable cervical, supraclavicular, or axillary adenopathy. Abdomen soft, non-tender, normal  bowel sounds.   Lab Findings: Lab Results  Component Value Date   WBC 7.3 12/25/2020   HGB 17.3 (H) 12/25/2020   HCT 53.9 (H) 12/25/2020   MCV 96.8 12/25/2020   PLT 222 12/25/2020    Radiographic Findings: CT Chest W Contrast  Result Date: 07/03/2021 CLINICAL DATA:  Non-small cell lung cancer, assess treatment response EXAM: CT CHEST WITH CONTRAST TECHNIQUE: Multidetector CT imaging of the chest was performed during intravenous contrast administration. CONTRAST:  18mL OMNIPAQUE IOHEXOL 350 MG/ML SOLN COMPARISON:  PET-CT, 12/13/2020 FINDINGS: Cardiovascular: Aortic atherosclerosis. Status post aortic valve replacement. Cardiomegaly. Extensive 3 vessel coronary artery calcifications and stents. No pericardial effusion. Mediastinum/Nodes: Unchanged prominent high right paratracheal lymph node, not previously PET avid (series 2, image 25). No other enlarged mediastinal, hilar, or axillary lymph nodes. Thyroid gland, trachea, and esophagus demonstrate no significant findings. Lungs/Pleura: Mild centrilobular emphysema. Interval post treatment change about a previously seen pulmonary nodule of the posterior lingula, with fibrosis and consolidation adjacent to fiducial markers (series 4, image 92). No pleural effusion or pneumothorax. Upper Abdomen: No acute abnormality. Musculoskeletal: No chest wall mass or suspicious bone lesions identified. IMPRESSION: 1. Interval post treatment change about a previously seen pulmonary nodule of the posterior lingula, with radiation fibrosis and consolidation adjacent to fiducial markers. Previously identified nodule is no longer distinctly visualized. 2. Unchanged prominent high right paratracheal lymph node, not previously PET avid. Attention on follow-up. 3. Emphysema. 4. Coronary artery disease. Aortic Atherosclerosis (ICD10-I70.0) and Emphysema (ICD10-J43.9). Electronically Signed   By: Eddie Candle M.D.   On: 07/03/2021 12:38   DG Foot Complete Left  Result  Date: 07/01/2021 Please see detailed radiograph report in office note.   Impression:  Non-small cell carcinoma of the left upper lobe (lingula), squamous cell, clinical stage IA2  He tolerated his SBRT well without any lingering side effects.  Recent chest CT scan shows good response to his SBRT.  Plan: Routine follow-up in 6 months.  Prior to this follow-up appointment the patient will undergo a chest CT scan with contrast.  He will follow-up with his primary care physician tomorrow concerning his diabetes mellitus and blood pressure.   20 minutes of total time was spent for this patient encounter, including preparation, face-to-face counseling with the patient and coordination of care, physical exam, and documentation of the encounter and ordering of new scans. ____________________________________  Blair Promise, PhD, MD   This document serves as a record of services personally performed by Gery Pray, MD. It was created on his behalf by Roney Mans, a trained medical scribe. The creation of this record is based on the scribe's personal observations and the provider's statements to them. This document has been checked and approved  by the attending provider.

## 2021-07-07 ENCOUNTER — Other Ambulatory Visit: Payer: Self-pay

## 2021-07-07 ENCOUNTER — Ambulatory Visit
Admission: RE | Admit: 2021-07-07 | Discharge: 2021-07-07 | Disposition: A | Discharge status: Home / Self Care | Payer: Medicare Other | Source: Ambulatory Visit | Attending: Radiation Oncology | Admitting: Radiation Oncology

## 2021-07-07 ENCOUNTER — Encounter: Payer: Self-pay | Admitting: Radiation Oncology

## 2021-07-07 VITALS — BP 178/74 | HR 70 | Temp 97.0°F | Resp 18 | Ht 68.0 in | Wt 175.2 lb

## 2021-07-07 DIAGNOSIS — J439 Emphysema, unspecified: Secondary | ICD-10-CM | POA: Insufficient documentation

## 2021-07-07 DIAGNOSIS — Z923 Personal history of irradiation: Secondary | ICD-10-CM | POA: Diagnosis not present

## 2021-07-07 DIAGNOSIS — I700 Atherosclerosis of aorta: Secondary | ICD-10-CM | POA: Diagnosis not present

## 2021-07-07 DIAGNOSIS — Z79899 Other long term (current) drug therapy: Secondary | ICD-10-CM | POA: Insufficient documentation

## 2021-07-07 DIAGNOSIS — Z08 Encounter for follow-up examination after completed treatment for malignant neoplasm: Secondary | ICD-10-CM | POA: Diagnosis not present

## 2021-07-07 DIAGNOSIS — C3412 Malignant neoplasm of upper lobe, left bronchus or lung: Secondary | ICD-10-CM | POA: Diagnosis not present

## 2021-07-07 DIAGNOSIS — Z7984 Long term (current) use of oral hypoglycemic drugs: Secondary | ICD-10-CM | POA: Diagnosis not present

## 2021-07-07 DIAGNOSIS — C3492 Malignant neoplasm of unspecified part of left bronchus or lung: Secondary | ICD-10-CM

## 2021-07-07 DIAGNOSIS — I2510 Atherosclerotic heart disease of native coronary artery without angina pectoris: Secondary | ICD-10-CM | POA: Diagnosis not present

## 2021-07-07 DIAGNOSIS — C3410 Malignant neoplasm of upper lobe, unspecified bronchus or lung: Secondary | ICD-10-CM

## 2021-07-07 NOTE — Progress Notes (Signed)
Leevi Cullars is here today for follow up post radiation to the lung.  Lung Side: Left, completed radiation treatment on 02/25/21  Does the patient complain of any of the following: Pain: no  Shortness of breath w/wo exertion: no Cough: no Hemoptysis: no Pain with swallowing: no Swallowing/choking concerns: no Appetite: good  Energy Level: Continues to have mild fatigue.  Post radiation skin Changes: intact    Additional comments if applicable:  Vitals:   84/53/64 1014  BP: (!) 178/74  Pulse: 70  Resp: 18  Temp: (!) 97 F (36.1 C)  TempSrc: Temporal  SpO2: 95%  Weight: 175 lb 4 oz (79.5 kg)  Height: 5\' 8"  (1.727 m)

## 2021-07-08 DIAGNOSIS — I6990 Unspecified sequelae of unspecified cerebrovascular disease: Secondary | ICD-10-CM | POA: Diagnosis not present

## 2021-07-08 DIAGNOSIS — E785 Hyperlipidemia, unspecified: Secondary | ICD-10-CM | POA: Diagnosis not present

## 2021-07-08 DIAGNOSIS — I700 Atherosclerosis of aorta: Secondary | ICD-10-CM | POA: Diagnosis not present

## 2021-07-08 DIAGNOSIS — I2510 Atherosclerotic heart disease of native coronary artery without angina pectoris: Secondary | ICD-10-CM | POA: Diagnosis not present

## 2021-07-08 DIAGNOSIS — E1122 Type 2 diabetes mellitus with diabetic chronic kidney disease: Secondary | ICD-10-CM | POA: Diagnosis not present

## 2021-07-08 DIAGNOSIS — I4819 Other persistent atrial fibrillation: Secondary | ICD-10-CM | POA: Diagnosis not present

## 2021-07-08 DIAGNOSIS — J449 Chronic obstructive pulmonary disease, unspecified: Secondary | ICD-10-CM | POA: Diagnosis not present

## 2021-07-08 DIAGNOSIS — N1831 Chronic kidney disease, stage 3a: Secondary | ICD-10-CM | POA: Diagnosis not present

## 2021-07-08 DIAGNOSIS — I1310 Hypertensive heart and chronic kidney disease without heart failure, with stage 1 through stage 4 chronic kidney disease, or unspecified chronic kidney disease: Secondary | ICD-10-CM | POA: Diagnosis not present

## 2021-07-08 DIAGNOSIS — I5032 Chronic diastolic (congestive) heart failure: Secondary | ICD-10-CM | POA: Diagnosis not present

## 2021-07-08 DIAGNOSIS — I739 Peripheral vascular disease, unspecified: Secondary | ICD-10-CM | POA: Diagnosis not present

## 2021-07-08 DIAGNOSIS — E1151 Type 2 diabetes mellitus with diabetic peripheral angiopathy without gangrene: Secondary | ICD-10-CM | POA: Diagnosis not present

## 2021-07-09 DIAGNOSIS — H43813 Vitreous degeneration, bilateral: Secondary | ICD-10-CM | POA: Diagnosis not present

## 2021-07-09 DIAGNOSIS — H31003 Unspecified chorioretinal scars, bilateral: Secondary | ICD-10-CM | POA: Diagnosis not present

## 2021-07-09 DIAGNOSIS — E119 Type 2 diabetes mellitus without complications: Secondary | ICD-10-CM | POA: Diagnosis not present

## 2021-07-09 DIAGNOSIS — H4421 Degenerative myopia, right eye: Secondary | ICD-10-CM | POA: Diagnosis not present

## 2021-07-17 ENCOUNTER — Ambulatory Visit (INDEPENDENT_AMBULATORY_CARE_PROVIDER_SITE_OTHER): Payer: Medicare Other

## 2021-07-17 ENCOUNTER — Other Ambulatory Visit: Payer: Self-pay

## 2021-07-17 DIAGNOSIS — I359 Nonrheumatic aortic valve disorder, unspecified: Secondary | ICD-10-CM

## 2021-07-17 DIAGNOSIS — Z5181 Encounter for therapeutic drug level monitoring: Secondary | ICD-10-CM | POA: Diagnosis not present

## 2021-07-17 LAB — POCT INR: INR: 2 (ref 2.0–3.0)

## 2021-07-17 NOTE — Patient Instructions (Addendum)
Description   Take 1.5 tablets tomorrow and 1.5 tablets Saturday (already taken today's dose) then resume same dosage 1 tablet daily except for 1.5 tablets on Thursdays.  Recheck INR in 2 weeks. Coumadin Clinic for any changes in medications or upcoming procedures. [phone]

## 2021-08-04 ENCOUNTER — Ambulatory Visit (HOSPITAL_COMMUNITY)
Admission: RE | Admit: 2021-08-04 | Discharge: 2021-08-04 | Disposition: A | Discharge status: Home / Self Care | Payer: Medicare Other | Source: Ambulatory Visit | Attending: Cardiology | Admitting: Cardiology

## 2021-08-04 ENCOUNTER — Other Ambulatory Visit: Payer: Self-pay

## 2021-08-04 ENCOUNTER — Other Ambulatory Visit (HOSPITAL_COMMUNITY): Payer: Self-pay | Admitting: Cardiovascular Disease

## 2021-08-04 DIAGNOSIS — I701 Atherosclerosis of renal artery: Secondary | ICD-10-CM

## 2021-08-08 ENCOUNTER — Ambulatory Visit (INDEPENDENT_AMBULATORY_CARE_PROVIDER_SITE_OTHER): Payer: Medicare Other | Admitting: Podiatry

## 2021-08-08 ENCOUNTER — Ambulatory Visit (INDEPENDENT_AMBULATORY_CARE_PROVIDER_SITE_OTHER): Payer: Medicare Other | Admitting: *Deleted

## 2021-08-08 ENCOUNTER — Other Ambulatory Visit: Payer: Self-pay

## 2021-08-08 ENCOUNTER — Ambulatory Visit (INDEPENDENT_AMBULATORY_CARE_PROVIDER_SITE_OTHER): Payer: Medicare Other

## 2021-08-08 DIAGNOSIS — D689 Coagulation defect, unspecified: Secondary | ICD-10-CM

## 2021-08-08 DIAGNOSIS — I359 Nonrheumatic aortic valve disorder, unspecified: Secondary | ICD-10-CM | POA: Diagnosis not present

## 2021-08-08 DIAGNOSIS — S92502D Displaced unspecified fracture of left lesser toe(s), subsequent encounter for fracture with routine healing: Secondary | ICD-10-CM

## 2021-08-08 DIAGNOSIS — B351 Tinea unguium: Secondary | ICD-10-CM

## 2021-08-08 DIAGNOSIS — S92502A Displaced unspecified fracture of left lesser toe(s), initial encounter for closed fracture: Secondary | ICD-10-CM

## 2021-08-08 DIAGNOSIS — Z5181 Encounter for therapeutic drug level monitoring: Secondary | ICD-10-CM | POA: Diagnosis not present

## 2021-08-08 DIAGNOSIS — M79675 Pain in left toe(s): Secondary | ICD-10-CM

## 2021-08-08 LAB — POCT INR: INR: 2.6 (ref 2.0–3.0)

## 2021-08-08 NOTE — Patient Instructions (Signed)
Description   Continue same dosage 1 tablet daily except for 1.5 tablets on Thursdays.  Recheck INR in 3 weeks. Coumadin Clinic for any changes in medications or upcoming procedures. [phone]

## 2021-08-18 NOTE — Progress Notes (Signed)
  Subjective:  Patient ID: [name] [name], male    DOB: 1946-07-11,  MRN: [id number]  Chief Complaint  Patient presents with   Nail Problem    Thick painful toenails, 3 month follow up   75 y.o. male presents with the above complaint. History confirmed with patient.    Objective:  Physical Exam: warm, good capillary refill, no trophic changes or ulcerative lesions, normal DP and PT pulses and normal sensory exam. Mild pain to palpation left 5th toe. Nails thickened and dystrophic  Assessment:   1. Closed fracture of phalanx of left fifth toe with routine healing, subsequent encounter   2. Pain in toe of left foot   3. Onychomycosis   4. Coagulation defect [facility])    Plan:  Patient was evaluated and treated and all questions answered.  Onychomycosis, coagulation defect  -Nails x10 debrided sharply and manually with large nail nipper and rotary burr.   Toe Fracture left 5th toe -XR taken and reviewed with patient. -Appears mostly healed. Ok to resume normal activity in normal shoegear.   No follow-ups on file.

## 2021-08-19 DIAGNOSIS — Z23 Encounter for immunization: Secondary | ICD-10-CM | POA: Diagnosis not present

## 2021-08-29 ENCOUNTER — Other Ambulatory Visit: Payer: Self-pay

## 2021-08-29 ENCOUNTER — Ambulatory Visit (INDEPENDENT_AMBULATORY_CARE_PROVIDER_SITE_OTHER): Payer: Medicare Other

## 2021-08-29 DIAGNOSIS — I359 Nonrheumatic aortic valve disorder, unspecified: Secondary | ICD-10-CM

## 2021-08-29 DIAGNOSIS — Z5181 Encounter for therapeutic drug level monitoring: Secondary | ICD-10-CM

## 2021-08-29 DIAGNOSIS — G459 Transient cerebral ischemic attack, unspecified: Secondary | ICD-10-CM

## 2021-08-29 LAB — POCT INR: INR: 2.4 (ref 2.0–3.0)

## 2021-08-29 NOTE — Patient Instructions (Signed)
-   take extra 1/2 tablet warfarin tonight, then - Continue same dosage 1 tablet daily except for 1.5 tablets on Thursdays.   - Recheck INR in 3 weeks.  Coumadin Clinic for any changes in medications or upcoming procedures. [phone]

## 2021-09-19 ENCOUNTER — Other Ambulatory Visit: Payer: Self-pay

## 2021-09-19 ENCOUNTER — Ambulatory Visit (INDEPENDENT_AMBULATORY_CARE_PROVIDER_SITE_OTHER): Payer: Medicare Other | Admitting: *Deleted

## 2021-09-19 DIAGNOSIS — Z5181 Encounter for therapeutic drug level monitoring: Secondary | ICD-10-CM | POA: Diagnosis not present

## 2021-09-19 DIAGNOSIS — I359 Nonrheumatic aortic valve disorder, unspecified: Secondary | ICD-10-CM

## 2021-09-19 LAB — POCT INR: INR: 2.9 (ref 2.0–3.0)

## 2021-09-19 NOTE — Patient Instructions (Signed)
Description   - Continue same dosage 1 tablet daily except for 1.5 tablets on Thursdays.   - Recheck INR in 4 weeks.  Coumadin Clinic for any changes in medications or upcoming procedures. [phone]

## 2021-09-20 ENCOUNTER — Other Ambulatory Visit (HOSPITAL_COMMUNITY): Payer: Self-pay | Admitting: Cardiology

## 2021-10-02 ENCOUNTER — Other Ambulatory Visit (HOSPITAL_COMMUNITY): Payer: Self-pay | Admitting: Cardiology

## 2021-10-06 DIAGNOSIS — N1831 Chronic kidney disease, stage 3a: Secondary | ICD-10-CM | POA: Diagnosis not present

## 2021-10-06 DIAGNOSIS — E1151 Type 2 diabetes mellitus with diabetic peripheral angiopathy without gangrene: Secondary | ICD-10-CM | POA: Diagnosis not present

## 2021-10-06 DIAGNOSIS — J449 Chronic obstructive pulmonary disease, unspecified: Secondary | ICD-10-CM | POA: Diagnosis not present

## 2021-10-06 DIAGNOSIS — E1122 Type 2 diabetes mellitus with diabetic chronic kidney disease: Secondary | ICD-10-CM | POA: Diagnosis not present

## 2021-10-09 ENCOUNTER — Telehealth: Payer: Self-pay | Admitting: Gastroenterology

## 2021-10-09 NOTE — Telephone Encounter (Signed)
Pt states he has been having abd pain at the top part of his stomach since Saturday night. Yesterday he had some diarrhea. Pt scheduled for first available appt with Ellouise Newer PA 10/15/21 at 1:30pm. Pt aware of appt. Pt knows to seek care with PCP or Urgent care if he needs to be seen sooner than scheduled appt.

## 2021-10-09 NOTE — Telephone Encounter (Signed)
Patient called stating he was having severe abdominal pain, excessive gas, and feels like he has to throw up about all the time.  He wants to be seen sooner rather than later, as if he doesn't get relief soon he will end up in the ER.  PA's & Doc's appointments are in December, and he would like to be seen within the next few days if possible.  Please call patient and advise.  Thank you.

## 2021-10-15 ENCOUNTER — Encounter: Payer: Self-pay | Admitting: Physician Assistant

## 2021-10-15 ENCOUNTER — Ambulatory Visit (INDEPENDENT_AMBULATORY_CARE_PROVIDER_SITE_OTHER): Payer: Medicare Other | Admitting: Physician Assistant

## 2021-10-15 VITALS — BP 136/84 | HR 70 | Ht 68.0 in | Wt 175.0 lb

## 2021-10-15 DIAGNOSIS — R1013 Epigastric pain: Secondary | ICD-10-CM | POA: Diagnosis not present

## 2021-10-15 DIAGNOSIS — K219 Gastro-esophageal reflux disease without esophagitis: Secondary | ICD-10-CM | POA: Diagnosis not present

## 2021-10-15 DIAGNOSIS — R112 Nausea with vomiting, unspecified: Secondary | ICD-10-CM

## 2021-10-15 DIAGNOSIS — I701 Atherosclerosis of renal artery: Secondary | ICD-10-CM

## 2021-10-15 MED ORDER — PANTOPRAZOLE SODIUM 40 MG PO TBEC: 40.0000 mg | DELAYED_RELEASE_TABLET | Freq: Two times a day (BID) | ORAL | 5 refills | Status: DC

## 2021-10-15 MED ORDER — SUCRALFATE 1 G PO TABS: 1.0000 g | ORAL_TABLET | Freq: Three times a day (TID) | ORAL | 2 refills | Status: DC

## 2021-10-15 NOTE — Progress Notes (Signed)
Chief Complaint: Abdominal pain  HPI:    [name] [name] is a 75 year old male with a past medical history of lung cancer status post chemo/radiation, CHF, aortic stenosis status post mechanical a valve repair on chronic Coumadin, COPD, GERD and multiple others listed below, known to Dr. Fuller Plan, who returns to clinic today with a complaint of abdominal pain.    08/2019 colonoscopy with 1 8 mm polyp in the cecum, a 10 mm polyp in the cecum, an 8 mm polyp in the transverse colon, 6 mm polyp in the descending colon, moderate diverticulosis in the entire examined colon and internal hemorrhoids.    01/08/2021 patient saw Dr. Fuller Plan for a change in bowel habits with constipation and increased gas.  At that time discussed the history of bleeding gastric ulcers in March 2020.  Also discussed his last colonoscopy 08/2019 with a post polypectomy bleed.  At that time he described an increase in constipation several weeks ago with occasional abdominal cramping and increased gas.  He had made dietary changes and his constipation and gas had resolved.  At that time encouraged to maintain a high-fiber diet and occasional MiraLAX if needed.  Recommended lifelong PPI once daily to help prevent recurrent ulcers.  Resumed on Pantoprazole 40 mg.  Also discussed that due to his age would not plan for any further surveillance colonoscopies.    10/09/2021 patient called to describe severe epigastric abdominal pain with some diarrhea and nausea.    Today, the patient tells me that about 10 days or so ago he woke up at 1:30 in the morning with an epigastric pain that was sharp and intense, he felt nauseous and about an hour later he vomited up his dinner.  This pain continued throughout the week last week but seemed to get somewhat better over the weekend, but then after eating dinner on Sunday, 10/12/2021 the pain came back.  Also feels like a tightness.  Often rated as this 8-08/2009.  Tells me this pain comes on and will typically last  for few hours and is usually right after he eats but he has not been able to tell certain things bother him more than others.  Does tell me he drinks 2 cups of coffee in the morning on an empty stomach and does have occasional alcohol.  He has been using 2 Tylenol PM at night to help him sleep since the pain started.    Also describes a pain which is closer to his umbilicus, still superior but further down which is almost constant and more of a just discomfort which he can feel at times and other times cannot, this pain has been there more chronically, but feels like it has been more consistent lately.  Occasional episodes of diarrhea.    Denies fever, chills, weight loss, blood in his stool, melena or further vomiting.  Past Medical History:  Diagnosis Date   Adenomatous colon polyp 09/1997   Anemia    Aortic stenosis    s/p [facility] mechanical AVR - Chronic Coumadin   Blood transfusion    "related to ITP"   CHF (congestive heart failure) (HCC)    COPD (chronic obstructive pulmonary disease) ([place])    patient denies this dx on 01/27/21   Coronary artery disease    s/p cabg x 3 11/2003: lima-lad, seq vg to rpda and rpl   Diverticulitis of colon    Dysrhythmia    a-fib   GERD (gastroesophageal reflux disease)    Heart murmur  History of radiation therapy 02/18/2021, 02/20/2021, 02/25/2021   SBRT to left lung     Dr Gery Pray   Hyperlipidemia    Hypertension    ITP (idiopathic thrombocytopenic purpura)    Lung cancer ([place])    last week   Peripheral arterial disease ([place])    a. history of aortobifemoral bypass grafting by Dr. Sherren Mocha early b. LE angiography 04/22/2015 patent aortobifem graft, DES to R SFA   Peripheral vascular disease ([place])    s/p Left external Iliac Artery stenting and subsequent left femoral endarterectomy 02/2011- post op course complicated by wound infxn req I&D 03/2011   Renal artery stenosis, native, bilateral ([place])    a. bilateral renal artery stenosis by recent  duplex ultrasound b. L renal artery stent 02/2015, R renal artery patent on angiogram   Stroke [facility]) 2015   TIA (transient ischemic attack) ~ 2013   Type II diabetes mellitus ([place])     Past Surgical History:  Procedure Laterality Date   ABDOMINAL AORTAGRAM N/A 12/16/2011   Procedure: ABDOMINAL Maxcine Ham;  Surgeon: Sherren Mocha, MD;  Location: [facility] CATH LAB;  Service: Cardiovascular;  Laterality: N/A;   ABDOMINAL AORTOGRAM W/LOWER EXTREMITY Right 02/29/2020   Procedure: ABDOMINAL AORTOGRAM W/LOWER EXTREMITY;  Surgeon: Lorretta Harp, MD;  Location: [place] CV LAB;  Service: Cardiovascular;  Laterality: Right;   ANGIOPLASTY / STENTING ILIAC     Left external Iliac Artery   AORTA - BILATERAL FEMORAL ARTERY BYPASS GRAFT  01/18/2012   Procedure: AORTA BIFEMORAL BYPASS GRAFT;  Surgeon: Curt Jews, MD;  Location: [place];  Service: Vascular;  Laterality: N/A;   AORTIC VALVE REPLACEMENT  ~ 2004   ATRIAL FIBRILLATION ABLATION N/A 11/20/2020   Procedure: ATRIAL FIBRILLATION ABLATION;  Surgeon: Constance Haw, MD;  Location: [place] CV LAB;  Service: Cardiovascular;  Laterality: N/A;   BRONCHIAL BIOPSY  01/28/2021   Procedure: BRONCHIAL BIOPSIES;  Surgeon: Garner Nash, DO;  Location: [place] ENDOSCOPY;  Service: Pulmonary;;   BRONCHIAL BRUSHINGS  01/28/2021   Procedure: BRONCHIAL BRUSHINGS;  Surgeon: Garner Nash, DO;  Location: [place];  Service: Pulmonary;;   BRONCHIAL NEEDLE ASPIRATION BIOPSY  01/28/2021   Procedure: BRONCHIAL NEEDLE ASPIRATION BIOPSIES;  Surgeon: Garner Nash, DO;  Location: [place] ENDOSCOPY;  Service: Pulmonary;;   BRONCHIAL WASHINGS  01/28/2021   Procedure: BRONCHIAL WASHINGS;  Surgeon: Garner Nash, DO;  Location: [place];  Service: Pulmonary;;   CARDIAC CATHETERIZATION  11/2003   /pt report 10/01/2016   CARDIAC VALVE REPLACEMENT  11/2003   aortic   CARDIOVERSION N/A 10/16/2019   Procedure: CARDIOVERSION;  Surgeon: Larey Dresser, MD;  Location:  [place];  Service: Cardiovascular;  Laterality: N/A;   CARDIOVERSION N/A 01/01/2021   Procedure: CARDIOVERSION;  Surgeon: Larey Dresser, MD;  Location: [place];  Service: Cardiovascular;  Laterality: N/A;   CATARACT EXTRACTION W/ INTRAOCULAR LENS  IMPLANT, BILATERAL Bilateral    COLONOSCOPY     COLONOSCOPY WITH PROPOFOL N/A 09/03/2019   Procedure: COLONOSCOPY WITH PROPOFOL;  Surgeon: Doran Stabler, MD;  Location: [facility] ENDOSCOPY;  Service: Gastroenterology;  Laterality: N/A;   CORONARY ARTERY BYPASS GRAFT  11/2003   Archie Endo 04/21/2011   CORONARY ATHERECTOMY N/A 10/10/2019   Procedure: CORONARY ATHERECTOMY;  Surgeon: Sherren Mocha, MD;  Location: [place] CV LAB;  Service: Cardiovascular;  Laterality: N/A;   CORONARY STENT INTERVENTION N/A 10/10/2019   Procedure: CORONARY STENT INTERVENTION;  Surgeon: Sherren Mocha, MD;  Location: [place] CV LAB;  Service: Cardiovascular;  Laterality: N/A;   CORONARY/GRAFT ANGIOGRAPHY N/A 09/18/2019   Procedure: CORONARY/GRAFT ANGIOGRAPHY;  Surgeon: Lorretta Harp, MD;  Location: [place] CV LAB;  Service: Cardiovascular;  Laterality: N/A;   ESOPHAGOGASTRODUODENOSCOPY (EGD) WITH PROPOFOL N/A 02/27/2019   Procedure: ESOPHAGOGASTRODUODENOSCOPY (EGD) WITH PROPOFOL;  Surgeon: Doran Stabler, MD;  Location: [place];  Service: Endoscopy;  Laterality: N/A;   FIDUCIAL MARKER PLACEMENT  01/28/2021   Procedure: FIDUCIAL MARKER PLACEMENT;  Surgeon: Garner Nash, DO;  Location: [place];  Service: Pulmonary;;   HEMOSTASIS CLIP PLACEMENT  09/03/2019   Procedure: HEMOSTASIS CLIP PLACEMENT;  Surgeon: Doran Stabler, MD;  Location: [facility];  Service: Gastroenterology;;   LOWER EXTREMITY ANGIOGRAM N/A 02/21/2015   Procedure: LOWER EXTREMITY ANGIOGRAM;  Surgeon: Lorretta Harp, MD;  Location: [facility] CATH LAB;  Service: Cardiovascular;  Laterality: N/A;   PERIPHERAL VASCULAR CATHETERIZATION N/A 04/22/2015   Procedure: Lower Extremity  Angiography;  Surgeon: Lorretta Harp, MD;  Location: [place] CV LAB;  Service: Cardiovascular;  Laterality: N/A;   PERIPHERAL VASCULAR CATHETERIZATION N/A 08/24/2016   Procedure: Lower Extremity Angiography;  Surgeon: Lorretta Harp, MD;  Location: [place] CV LAB;  Service: Cardiovascular;  Laterality: N/A;   PERIPHERAL VASCULAR CATHETERIZATION Right 10/01/2016   Procedure: Peripheral Vascular Intervention - STENT;  Surgeon: Lorretta Harp, MD;  Location: [place] CV LAB;  Service: Cardiovascular;  Laterality: Right;  Prox and MID SFA    PERIPHERAL VASCULAR INTERVENTION  02/29/2020   Procedure: PERIPHERAL VASCULAR INTERVENTION;  Surgeon: Lorretta Harp, MD;  Location: [place] CV LAB;  Service: Cardiovascular;;  Right SFA   POLYPECTOMY     RENAL ANGIOGRAM N/A 02/21/2015   Procedure: RENAL ANGIOGRAM;  Surgeon: Lorretta Harp, MD;  Location: [facility] CATH LAB;  Service: Cardiovascular;  Laterality: Bilateral; 6 mm x 12 mm long Herculink balloon expandable stent to the left renal artery   RENAL ARTERY STENT Left 04/22/2015   dr berry   SPLENECTOMY  02/2003   Archie Endo 04/21/2011   TEE WITHOUT CARDIOVERSION N/A 10/16/2019   Procedure: TRANSESOPHAGEAL ECHOCARDIOGRAM (TEE);  Surgeon: Larey Dresser, MD;  Location: [facility] ENDOSCOPY;  Service: Cardiovascular;  Laterality: N/A;   TEMPORARY PACEMAKER N/A 10/10/2019   Procedure: TEMPORARY PACEMAKER;  Surgeon: Sherren Mocha, MD;  Location: [place] CV LAB;  Service: Cardiovascular;  Laterality: N/A;   TONSILLECTOMY  ~ [place] N/A 01/28/2021   Procedure: VIDEO BRONCHOSCOPY WITH ENDOBRONCHIAL NAVIGATION;  Surgeon: Garner Nash, DO;  Location: [place];  Service: Pulmonary;  Laterality: N/A;    Current Outpatient Medications  Medication Sig Dispense Refill   amiodarone (PACERONE) 200 MG tablet Take 0.5 tablets (100 mg total) by mouth daily. 90 tablet 3   amoxicillin (AMOXIL) 500 MG  capsule Take 2,000 mg by mouth See admin instructions. Take 2000 mg by mouth one hour prior to dental procedures     atorvastatin (LIPITOR) 80 MG tablet Take 80 mg by mouth daily.     Blood Glucose Monitoring Suppl (ONETOUCH VERIO IQ SYSTEM) w/Device KIT Check blood sugar 2x a day Dx-E11.51     ENTRESTO 24-26 MG TAKE 1 TABLET BY MOUTH TWICE A DAY 60 tablet 6   Evolocumab 140 MG/ML SOAJ Inject 140 mg into the skin every 14 (fourteen) days.     furosemide (LASIX) 20 MG tablet TAKE 2 TABLETS (40 MG TOTAL) BY MOUTH DAILY. 180 tablet 1   glucose blood (ONETOUCH VERIO)  test strip use 1 strip qd to check cbg Dx:  E11.51     glyBURIDE (DIABETA) 2.5 MG tablet Take 5 mg by mouth 2 (two) times daily with a meal.     INVOKANA 100 MG TABS tablet Take 100 mg by mouth daily.     nitroGLYCERIN (NITROSTAT) 0.4 MG SL tablet Place 1 tablet (0.4 mg total) under the tongue every 5 (five) minutes x 3 doses as needed for chest pain. 25 tablet 12   OneTouch Delica Lancets 07P MISC Check blood sugar 2x a day Dx-E11.51     pantoprazole (PROTONIX) 40 MG tablet Take 1 tablet (40 mg total) by mouth daily. 30 tablet 11   sildenafil (VIAGRA) 100 MG tablet Take 1 tablet po q daily prn sex (Patient not taking: Reported on 07/07/2021)     spironolactone (ALDACTONE) 25 MG tablet TAKE 1 TABLET BY MOUTH EVERY DAY 90 tablet 3   warfarin (COUMADIN) 5 MG tablet TAKE 1 TO 1 AND 1/2 TABLETS BY MOUTH DAILY AS PRESCRIBED BY THE COUMADIN CLINIC. 120 tablet 1   No current facility-administered medications for this visit.    Allergies as of 10/15/2021 - Review Complete 07/07/2021  Allergen Reaction Noted   Plavix [clopidogrel]  05/06/2021    Family History  Problem Relation Age of Onset   Coronary artery disease Mother        bypass surgery - deceased   Heart disease Father        murmur, valve replacement - deceased   Breast cancer Sister    Diabetes Other        grandmother   Diabetes Paternal Grandmother    Diabetes Paternal  Aunt    Colon cancer Neg Hx    Colon polyps Neg Hx    Esophageal cancer Neg Hx    Rectal cancer Neg Hx    Stomach cancer Neg Hx     Social History   Socioeconomic History   Marital status: Divorced    Spouse name: Not on file   Number of children: 3   Years of education: Not on file   Highest education level: Not on file  Occupational History   Occupation: Retired  Tobacco Use   Smoking status: Former    Years: 30.00    Types: Cigarettes, Cigars    Quit date: 12/15/1993    Years since quitting: 27.8   Smokeless tobacco: Never   Tobacco comments:    occasional cigar  Vaping Use   Vaping Use: Never used  Substance and Sexual Activity   Alcohol use: Yes    Alcohol/week: 16.0 standard drinks    Types: 1 Cans of beer, 1 Shots of liquor, 14 Standard drinks or equivalent per week    Comment: drinks 2 martini's a night (2 shots in each)   Drug use: No   Sexual activity: Yes  Other Topics Concern   Not on file  Social History Narrative   Tries to remain active.  Frequent golfer but claudication limits this.   Social Determinants of Health   Financial Resource Strain: Not on file  Food Insecurity: Not on file  Transportation Needs: Not on file  Physical Activity: Not on file  Stress: Not on file  Social Connections: Not on file  Intimate Partner Violence: Not on file    Review of Systems:    Constitutional: No weight loss, fever or chills Skin: No rash Cardiovascular: No chest pain Respiratory: No SOB  Gastrointestinal: See HPI and otherwise negative Genitourinary: No  dysuria or change in urinary frequency Neurological: No headache, dizziness or syncope Musculoskeletal: No new muscle or joint pain Hematologic: No bleeding  Psychiatric: No history of depression or anxiety   Physical Exam:  Vital signs: BP 136/84   Pulse 70   Ht 5' 8"  (1.727 m)   Wt 175 lb (79.4 kg)   BMI 26.61 kg/m    Constitutional:   Pleasant elderly Caucasian male appears to be in NAD,  Well developed, Well nourished, alert and cooperative Respiratory: Respirations even and unlabored. Lungs clear to auscultation bilaterally.   No wheezes, crackles, or rhonchi.  Cardiovascular: Normal S1, S2. No MRG. Regular rate and rhythm. No peripheral edema, cyanosis or pallor.  Gastrointestinal:  Soft, nondistended, nontender. No rebound or guarding. Normal bowel sounds. No appreciable masses or hepatomegaly. Rectal:  Not performed.  Psychiatric: Demonstrates good judgement and reason without abnormal affect or behaviors.  RELEVANT LABS AND IMAGING: CBC    Component Value Date/Time   WBC 7.3 12/25/2020 1008   RBC 5.57 12/25/2020 1008   HGB 17.3 (H) 12/25/2020 1008   HGB 15.9 11/04/2020 1529   HCT 53.9 (H) 12/25/2020 1008   HCT 47.4 11/04/2020 1529   PLT 222 12/25/2020 1008   PLT 216 11/04/2020 1529   MCV 96.8 12/25/2020 1008   MCV 95 11/04/2020 1529   MCH 31.1 12/25/2020 1008   MCHC 32.1 12/25/2020 1008   RDW 14.7 12/25/2020 1008   RDW 13.3 11/04/2020 1529   LYMPHSABS 2.0 12/07/2020 2356   MONOABS 1.4 (H) 12/07/2020 2356   EOSABS 0.1 12/07/2020 2356   BASOSABS 0.1 12/07/2020 2356    CMP     Component Value Date/Time   NA 139 04/21/2021 1042   NA 140 11/04/2020 1529   K 4.8 04/21/2021 1042   CL 103 04/21/2021 1042   CO2 29 04/21/2021 1042   GLUCOSE 265 (H) 04/21/2021 1042   BUN 23 04/21/2021 1042   BUN 20 11/04/2020 1529   CREATININE 1.00 07/02/2021 1004   CREATININE 0.74 09/28/2016 1146   CALCIUM 9.4 04/21/2021 1042   PROT 6.8 04/21/2021 1042   PROT 6.9 09/30/2020 1606   ALBUMIN 3.8 04/21/2021 1042   ALBUMIN 4.3 09/30/2020 1606   AST 23 04/21/2021 1042   ALT 21 04/21/2021 1042   ALKPHOS 62 04/21/2021 1042   BILITOT 1.0 04/21/2021 1042   BILITOT 0.4 09/30/2020 1606   GFRNONAA >60 04/21/2021 1042   GFRAA 68 11/04/2020 1529    Assessment: 1.  Epigastric pain: Acute epigastric pain that started about 10 days ago, waxing and waning in intensity, history of  gastric ulcers and reflux, no new medicines or NSAIDs; consider gastritis +/- PUD versus other 2.  GERD: Currently on Pantoprazole 40 mg daily 3.  History of gastric ulcers  Plan: 1.  Discussed with patient that likely this is gastritis with the possibility of ulcers.  We will try to avoid extensive work-up right now, but he may need further evaluation if it does not get any better. 2.  For now increase Pantoprazole to 40 mg twice daily, 30-60 minutes before breakfast and dinner.  Prescribed #60 with 3 refills. 3.  Also started the patient on Carafate 1 g 4 times daily, 1 hour before or 2 hours after eating and other medications.  Prescribed #120 with 1 refill. 4.  Reviewed antireflux diet and lifestyle modifications. 5.  Patient was advised to call our clinic if he does not feel a lot better after a week of being on these  medicines.  If he does not feel better would require CBC/CMP and likely an EGD, though this may need to be off of his blood thinner.  He is at higher risk for anesthesia given his heart history. 6.  Patient will be set up a follow-up appointment in 1 month, though if he is no better prior to that we will consider further work-up.  Ellouise Newer, PA-C [facility] 10/15/2021, 1:25 PM  Cc: Shon Baton, MD

## 2021-10-15 NOTE — Patient Instructions (Signed)
We have sent the following medications to your pharmacy for you to pick up at your convenience: Increase Pantoprazole 40 mg twice daily 30-60 minutes before breakfast and dinner.  Carafate 1 gm four times daily 1 hour before meals or 2 hours after meals.  If you are age 76 or older, your body mass index should be between 23-30. Your Body mass index is 26.61 kg/m. If this is out of the aforementioned range listed, please consider follow up with your Primary Care Provider.  If you are age 45 or younger, your body mass index should be between 19-25. Your Body mass index is 26.61 kg/m. If this is out of the aformentioned range listed, please consider follow up with your Primary Care Provider.   ________________________________________________________  The [facility] providers would like to encourage you to use [facility] to communicate with providers for non-urgent requests or questions.  Due to long hold times on the telephone, sending your provider a message by [facility] may be a faster and more efficient way to get a response.  Please allow 48 business hours for a response.  Please remember that this is for non-urgent requests.  _______________________________________________________

## 2021-10-15 NOTE — Progress Notes (Signed)
Reviewed and agree with management plan.  Litzi Binning T. Carletta Feasel, MD FACG 

## 2021-10-17 ENCOUNTER — Other Ambulatory Visit: Payer: Self-pay

## 2021-10-17 ENCOUNTER — Ambulatory Visit (INDEPENDENT_AMBULATORY_CARE_PROVIDER_SITE_OTHER): Payer: Medicare Other

## 2021-10-17 DIAGNOSIS — I359 Nonrheumatic aortic valve disorder, unspecified: Secondary | ICD-10-CM

## 2021-10-17 DIAGNOSIS — Z5181 Encounter for therapeutic drug level monitoring: Secondary | ICD-10-CM | POA: Diagnosis not present

## 2021-10-17 LAB — POCT INR: INR: 2.1 (ref 2.0–3.0)

## 2021-10-17 NOTE — Patient Instructions (Signed)
Description   Take 2 tablets today and then continue same dosage 1 tablet daily except for 1.5 tablets on Thursdays.   - Recheck INR in 4 weeks.  Coumadin Clinic for any changes in medications or upcoming procedures. [phone]

## 2021-11-04 ENCOUNTER — Ambulatory Visit (INDEPENDENT_AMBULATORY_CARE_PROVIDER_SITE_OTHER): Payer: Medicare Other | Admitting: Podiatry

## 2021-11-04 ENCOUNTER — Other Ambulatory Visit: Payer: Self-pay

## 2021-11-04 DIAGNOSIS — B351 Tinea unguium: Secondary | ICD-10-CM

## 2021-11-04 DIAGNOSIS — D689 Coagulation defect, unspecified: Secondary | ICD-10-CM

## 2021-11-05 NOTE — Progress Notes (Signed)
  Subjective:  Patient ID: [name] [name], male    DOB: Nov 12, 1946,  MRN: [id number]  No chief complaint on file.  75 y.o. male presents with the above complaint. History confirmed with patient. Presents for care of his nails, denies other issues. The 5th toe he previously injured is doing well without issues.  Objective:  Physical Exam: warm, good capillary refill, no trophic changes or ulcerative lesions, normal DP and PT pulses and normal sensory exam. \Nails thickened and dystrophic  Assessment:   1. Onychomycosis   2. Coagulation defect [facility])    Plan:  Patient was evaluated and treated and all questions answered.  Onychomycosis, coagulation defect  -Nails debrided   Procedure: Nail Debridement Type of Debridement: manual, sharp debridement. Instrumentation: Nail nipper, rotary burr. Number of Nails: 10     No follow-ups on file.

## 2021-11-07 ENCOUNTER — Ambulatory Visit: Payer: Medicare Other | Admitting: Podiatry

## 2021-11-07 DIAGNOSIS — I5032 Chronic diastolic (congestive) heart failure: Secondary | ICD-10-CM | POA: Diagnosis not present

## 2021-11-07 DIAGNOSIS — E1122 Type 2 diabetes mellitus with diabetic chronic kidney disease: Secondary | ICD-10-CM | POA: Diagnosis not present

## 2021-11-07 DIAGNOSIS — N1831 Chronic kidney disease, stage 3a: Secondary | ICD-10-CM | POA: Diagnosis not present

## 2021-11-07 DIAGNOSIS — J449 Chronic obstructive pulmonary disease, unspecified: Secondary | ICD-10-CM | POA: Diagnosis not present

## 2021-11-07 DIAGNOSIS — E785 Hyperlipidemia, unspecified: Secondary | ICD-10-CM | POA: Diagnosis not present

## 2021-11-07 DIAGNOSIS — I2510 Atherosclerotic heart disease of native coronary artery without angina pectoris: Secondary | ICD-10-CM | POA: Diagnosis not present

## 2021-11-07 DIAGNOSIS — E1151 Type 2 diabetes mellitus with diabetic peripheral angiopathy without gangrene: Secondary | ICD-10-CM | POA: Diagnosis not present

## 2021-11-07 DIAGNOSIS — I4819 Other persistent atrial fibrillation: Secondary | ICD-10-CM | POA: Diagnosis not present

## 2021-11-07 DIAGNOSIS — I701 Atherosclerosis of renal artery: Secondary | ICD-10-CM | POA: Diagnosis not present

## 2021-11-07 DIAGNOSIS — I739 Peripheral vascular disease, unspecified: Secondary | ICD-10-CM | POA: Diagnosis not present

## 2021-11-07 DIAGNOSIS — I700 Atherosclerosis of aorta: Secondary | ICD-10-CM | POA: Diagnosis not present

## 2021-11-07 DIAGNOSIS — I1310 Hypertensive heart and chronic kidney disease without heart failure, with stage 1 through stage 4 chronic kidney disease, or unspecified chronic kidney disease: Secondary | ICD-10-CM | POA: Diagnosis not present

## 2021-11-14 ENCOUNTER — Telehealth: Payer: Self-pay

## 2021-11-14 NOTE — Telephone Encounter (Signed)
Pt missed INR appt. Called, no answer. LMOM

## 2021-11-19 ENCOUNTER — Ambulatory Visit: Payer: Medicare Other | Admitting: Physician Assistant

## 2021-11-24 ENCOUNTER — Ambulatory Visit (INDEPENDENT_AMBULATORY_CARE_PROVIDER_SITE_OTHER): Payer: Medicare Other

## 2021-11-24 ENCOUNTER — Other Ambulatory Visit: Payer: Self-pay

## 2021-11-24 DIAGNOSIS — I359 Nonrheumatic aortic valve disorder, unspecified: Secondary | ICD-10-CM | POA: Diagnosis not present

## 2021-11-24 DIAGNOSIS — Z5181 Encounter for therapeutic drug level monitoring: Secondary | ICD-10-CM | POA: Diagnosis not present

## 2021-11-24 DIAGNOSIS — G459 Transient cerebral ischemic attack, unspecified: Secondary | ICD-10-CM

## 2021-11-24 LAB — POCT INR: INR: 2.5 (ref 2.0–3.0)

## 2021-11-24 NOTE — Patient Instructions (Signed)
Description   Continue same dosage 1 tablet daily except for 1.5 tablets on Thursdays.   Recheck INR in 4 weeks.  Coumadin Clinic for any changes in medications or upcoming procedures. [phone]

## 2021-12-17 ENCOUNTER — Encounter: Payer: Self-pay | Admitting: Physician Assistant

## 2021-12-17 ENCOUNTER — Ambulatory Visit (INDEPENDENT_AMBULATORY_CARE_PROVIDER_SITE_OTHER): Payer: Medicare Other | Admitting: Physician Assistant

## 2021-12-17 VITALS — BP 120/70 | HR 70 | Ht 68.0 in | Wt 179.6 lb

## 2021-12-17 DIAGNOSIS — Z8711 Personal history of peptic ulcer disease: Secondary | ICD-10-CM | POA: Diagnosis not present

## 2021-12-17 DIAGNOSIS — R1013 Epigastric pain: Secondary | ICD-10-CM | POA: Diagnosis not present

## 2021-12-17 DIAGNOSIS — R112 Nausea with vomiting, unspecified: Secondary | ICD-10-CM

## 2021-12-17 DIAGNOSIS — K219 Gastro-esophageal reflux disease without esophagitis: Secondary | ICD-10-CM

## 2021-12-17 NOTE — Progress Notes (Signed)
Chief Complaint: Follow-up epigastric pain, nausea and vomiting  HPI:    [name] [name] is a 76 year old male with a past medical history of lung cancer status post chemo/radiation, CHF, aortic stenosis, status post mechanical valve repair on chronic Coumadin, COPD, GERD and multiple others, known to Dr. Fuller Plan, who returns to clinic today for follow-up of his epigastric pain, nausea and vomiting.    08/2019 colonoscopy with 1 8 mm polyp in the cecum, a 10 mm polyp in the cecum, an 8 mm polyp in the transverse colon, 6 mm polyp in the descending colon, moderate diverticulosis in the entire examined colon and internal hemorrhoids.    01/08/2021 patient saw Dr. Fuller Plan for a change in bowel habits with constipation and increased gas.  At that time discussed the history of bleeding gastric ulcers in March 2020.  Also discussed his last colonoscopy 08/2019 with a post polypectomy bleed.  At that time he described an increase in constipation several weeks ago with occasional abdominal cramping and increased gas.  He had made dietary changes and his constipation and gas had resolved.  At that time encouraged to maintain a high-fiber diet and occasional MiraLAX if needed.  Recommended lifelong PPI once daily to help prevent recurrent ulcers.  Resumed on Pantoprazole 40 mg.  Also discussed that due to his age would not plan for any further surveillance colonoscopies.    10/09/2021 patient called to describe severe epigastric abdominal pain with some diarrhea and nausea.    10/15/2021 patient seen in clinic and described that 10 days prior he had woke up at 1:30 in the morning with epigastric pain that was sharp and intense with nausea and some vomiting.  Pain was occurring after eating.  At that time increase his Pantoprazole to 40 mg twice a day and also started him on Carafate 1 g 4 times daily.    Today, the patient tells me that he has improved, and his symptoms have definitely decreased and he has had no further  episodes of vomiting but his nausea and pain still seems to come and go, over the weekend he had more pain.  Currently he is able to use his Carafate 3 times a day and continues on his Pantoprazole 40 mg twice daily.  Does report some looser stools with this medication.  He feels like things are getting better and would like to hold the course.    Denies fever, chills, weight loss or blood in his stool.  Past Medical History:  Diagnosis Date   Adenomatous colon polyp 09/1997   Anemia    Aortic stenosis    s/p [facility] mechanical AVR - Chronic Coumadin   Blood transfusion    "related to ITP"   CHF (congestive heart failure) (HCC)    COPD (chronic obstructive pulmonary disease) ([place])    patient denies this dx on 01/27/21   Coronary artery disease    s/p cabg x 3 11/2003: lima-lad, seq vg to rpda and rpl   Diverticulitis of colon    Dysrhythmia    a-fib   GERD (gastroesophageal reflux disease)    Heart murmur    History of radiation therapy 02/18/2021, 02/20/2021, 02/25/2021   SBRT to left lung     Dr Gery Pray   Hyperlipidemia    Hypertension    ITP (idiopathic thrombocytopenic purpura)    Lung cancer ([place])    last week   Peripheral arterial disease ([place])    a. history of aortobifemoral bypass grafting by  Dr. Sherren Mocha early b. LE angiography 04/22/2015 patent aortobifem graft, DES to R SFA   Peripheral vascular disease ([place])    s/p Left external Iliac Artery stenting and subsequent left femoral endarterectomy 02/2011- post op course complicated by wound infxn req I&D 03/2011   Renal artery stenosis, native, bilateral ([place])    a. bilateral renal artery stenosis by recent duplex ultrasound b. L renal artery stent 02/2015, R renal artery patent on angiogram   Stroke [facility]) 2015   TIA (transient ischemic attack) ~ 2013   Type II diabetes mellitus ([place])     Past Surgical History:  Procedure Laterality Date   ABDOMINAL AORTAGRAM N/A 12/16/2011   Procedure: ABDOMINAL Maxcine Ham;  Surgeon: Sherren Mocha, MD;  Location: [facility] CATH LAB;  Service: Cardiovascular;  Laterality: N/A;   ABDOMINAL AORTOGRAM W/LOWER EXTREMITY Right 02/29/2020   Procedure: ABDOMINAL AORTOGRAM W/LOWER EXTREMITY;  Surgeon: Lorretta Harp, MD;  Location: [place] CV LAB;  Service: Cardiovascular;  Laterality: Right;   ANGIOPLASTY / STENTING ILIAC     Left external Iliac Artery   AORTA - BILATERAL FEMORAL ARTERY BYPASS GRAFT  01/18/2012   Procedure: AORTA BIFEMORAL BYPASS GRAFT;  Surgeon: Curt Jews, MD;  Location: [place];  Service: Vascular;  Laterality: N/A;   AORTIC VALVE REPLACEMENT  ~ 2004   ATRIAL FIBRILLATION ABLATION N/A 11/20/2020   Procedure: ATRIAL FIBRILLATION ABLATION;  Surgeon: Constance Haw, MD;  Location: [place] CV LAB;  Service: Cardiovascular;  Laterality: N/A;   BRONCHIAL BIOPSY  01/28/2021   Procedure: BRONCHIAL BIOPSIES;  Surgeon: Garner Nash, DO;  Location: [place] ENDOSCOPY;  Service: Pulmonary;;   BRONCHIAL BRUSHINGS  01/28/2021   Procedure: BRONCHIAL BRUSHINGS;  Surgeon: Garner Nash, DO;  Location: [place];  Service: Pulmonary;;   BRONCHIAL NEEDLE ASPIRATION BIOPSY  01/28/2021   Procedure: BRONCHIAL NEEDLE ASPIRATION BIOPSIES;  Surgeon: Garner Nash, DO;  Location: [place] ENDOSCOPY;  Service: Pulmonary;;   BRONCHIAL WASHINGS  01/28/2021   Procedure: BRONCHIAL WASHINGS;  Surgeon: Garner Nash, DO;  Location: [place];  Service: Pulmonary;;   CARDIAC CATHETERIZATION  11/2003   /pt report 10/01/2016   CARDIAC VALVE REPLACEMENT  11/2003   aortic   CARDIOVERSION N/A 10/16/2019   Procedure: CARDIOVERSION;  Surgeon: Larey Dresser, MD;  Location: [place];  Service: Cardiovascular;  Laterality: N/A;   CARDIOVERSION N/A 01/01/2021   Procedure: CARDIOVERSION;  Surgeon: Larey Dresser, MD;  Location: [place];  Service: Cardiovascular;  Laterality: N/A;   CATARACT EXTRACTION W/ INTRAOCULAR LENS  IMPLANT, BILATERAL Bilateral    COLONOSCOPY     COLONOSCOPY WITH  PROPOFOL N/A 09/03/2019   Procedure: COLONOSCOPY WITH PROPOFOL;  Surgeon: Doran Stabler, MD;  Location: [facility] ENDOSCOPY;  Service: Gastroenterology;  Laterality: N/A;   CORONARY ARTERY BYPASS GRAFT  11/2003   Archie Endo 04/21/2011   CORONARY ATHERECTOMY N/A 10/10/2019   Procedure: CORONARY ATHERECTOMY;  Surgeon: Sherren Mocha, MD;  Location: [place] CV LAB;  Service: Cardiovascular;  Laterality: N/A;   CORONARY STENT INTERVENTION N/A 10/10/2019   Procedure: CORONARY STENT INTERVENTION;  Surgeon: Sherren Mocha, MD;  Location: [place] CV LAB;  Service: Cardiovascular;  Laterality: N/A;   CORONARY/GRAFT ANGIOGRAPHY N/A 09/18/2019   Procedure: CORONARY/GRAFT ANGIOGRAPHY;  Surgeon: Lorretta Harp, MD;  Location: [place] CV LAB;  Service: Cardiovascular;  Laterality: N/A;   ESOPHAGOGASTRODUODENOSCOPY (EGD) WITH PROPOFOL N/A 02/27/2019   Procedure: ESOPHAGOGASTRODUODENOSCOPY (EGD) WITH PROPOFOL;  Surgeon: Doran Stabler, MD;  Location: [place];  Service: Endoscopy;  Laterality: N/A;   FIDUCIAL MARKER PLACEMENT  01/28/2021   Procedure: FIDUCIAL MARKER PLACEMENT;  Surgeon: Garner Nash, DO;  Location: [place];  Service: Pulmonary;;   HEMOSTASIS CLIP PLACEMENT  09/03/2019   Procedure: HEMOSTASIS CLIP PLACEMENT;  Surgeon: Doran Stabler, MD;  Location: [facility];  Service: Gastroenterology;;   LOWER EXTREMITY ANGIOGRAM N/A 02/21/2015   Procedure: LOWER EXTREMITY ANGIOGRAM;  Surgeon: Lorretta Harp, MD;  Location: [facility] CATH LAB;  Service: Cardiovascular;  Laterality: N/A;   PERIPHERAL VASCULAR CATHETERIZATION N/A 04/22/2015   Procedure: Lower Extremity Angiography;  Surgeon: Lorretta Harp, MD;  Location: [place] CV LAB;  Service: Cardiovascular;  Laterality: N/A;   PERIPHERAL VASCULAR CATHETERIZATION N/A 08/24/2016   Procedure: Lower Extremity Angiography;  Surgeon: Lorretta Harp, MD;  Location: [place] CV LAB;  Service: Cardiovascular;  Laterality: N/A;    PERIPHERAL VASCULAR CATHETERIZATION Right 10/01/2016   Procedure: Peripheral Vascular Intervention - STENT;  Surgeon: Lorretta Harp, MD;  Location: [place] CV LAB;  Service: Cardiovascular;  Laterality: Right;  Prox and MID SFA    PERIPHERAL VASCULAR INTERVENTION  02/29/2020   Procedure: PERIPHERAL VASCULAR INTERVENTION;  Surgeon: Lorretta Harp, MD;  Location: [place] CV LAB;  Service: Cardiovascular;;  Right SFA   POLYPECTOMY     RENAL ANGIOGRAM N/A 02/21/2015   Procedure: RENAL ANGIOGRAM;  Surgeon: Lorretta Harp, MD;  Location: [facility] CATH LAB;  Service: Cardiovascular;  Laterality: Bilateral; 6 mm x 12 mm long Herculink balloon expandable stent to the left renal artery   RENAL ARTERY STENT Left 04/22/2015   dr berry   SPLENECTOMY  02/2003   Archie Endo 04/21/2011   TEE WITHOUT CARDIOVERSION N/A 10/16/2019   Procedure: TRANSESOPHAGEAL ECHOCARDIOGRAM (TEE);  Surgeon: Larey Dresser, MD;  Location: [facility] ENDOSCOPY;  Service: Cardiovascular;  Laterality: N/A;   TEMPORARY PACEMAKER N/A 10/10/2019   Procedure: TEMPORARY PACEMAKER;  Surgeon: Sherren Mocha, MD;  Location: [place] CV LAB;  Service: Cardiovascular;  Laterality: N/A;   TONSILLECTOMY  ~ [place] N/A 01/28/2021   Procedure: VIDEO BRONCHOSCOPY WITH ENDOBRONCHIAL NAVIGATION;  Surgeon: Garner Nash, DO;  Location: [place];  Service: Pulmonary;  Laterality: N/A;    Current Outpatient Medications  Medication Sig Dispense Refill   amiodarone (PACERONE) 200 MG tablet Take 0.5 tablets (100 mg total) by mouth daily. 90 tablet 3   amoxicillin (AMOXIL) 500 MG capsule Take 2,000 mg by mouth See admin instructions. Take 2000 mg by mouth one hour prior to dental procedures     atorvastatin (LIPITOR) 80 MG tablet Take 80 mg by mouth daily.     Blood Glucose Monitoring Suppl (ONETOUCH VERIO IQ SYSTEM) w/Device KIT Check blood sugar 2x a day Dx-E11.51     ENTRESTO 24-26 MG TAKE 1 TABLET  BY MOUTH TWICE A DAY 60 tablet 6   Evolocumab 140 MG/ML SOAJ Inject 140 mg into the skin every 14 (fourteen) days.     furosemide (LASIX) 20 MG tablet TAKE 2 TABLETS (40 MG TOTAL) BY MOUTH DAILY. 180 tablet 1   glucose blood (ONETOUCH VERIO) test strip use 1 strip qd to check cbg Dx:  E11.51     glyBURIDE (DIABETA) 2.5 MG tablet Take 5 mg by mouth 2 (two) times daily with a meal.     INVOKANA 100 MG TABS tablet Take 100 mg by mouth daily.     nitroGLYCERIN (NITROSTAT) 0.4 MG SL tablet Place 1 tablet (  0.4 mg total) under the tongue every 5 (five) minutes x 3 doses as needed for chest pain. 25 tablet 12   OneTouch Delica Lancets 26R MISC Check blood sugar 2x a day Dx-E11.51     pantoprazole (PROTONIX) 40 MG tablet Take 1 tablet (40 mg total) by mouth 2 (two) times daily before a meal. 60 tablet 5   sildenafil (VIAGRA) 100 MG tablet      spironolactone (ALDACTONE) 25 MG tablet TAKE 1 TABLET BY MOUTH EVERY DAY 90 tablet 3   sucralfate (CARAFATE) 1 g tablet Take 1 tablet (1 g total) by mouth 4 (four) times daily -  with meals and at bedtime. 120 tablet 2   warfarin (COUMADIN) 5 MG tablet TAKE 1 TO 1 AND 1/2 TABLETS BY MOUTH DAILY AS PRESCRIBED BY THE COUMADIN CLINIC. 120 tablet 1   No current facility-administered medications for this visit.    Allergies as of 12/17/2021   (No Active Allergies)    Family History  Problem Relation Age of Onset   Coronary artery disease Mother        bypass surgery - deceased   Heart disease Father        murmur, valve replacement - deceased   Breast cancer Sister    Diabetes Other        grandmother   Diabetes Paternal Grandmother    Diabetes Paternal Aunt    Colon cancer Neg Hx    Colon polyps Neg Hx    Esophageal cancer Neg Hx    Rectal cancer Neg Hx    Stomach cancer Neg Hx     Social History   Socioeconomic History   Marital status: Divorced    Spouse name: Not on file   Number of children: 3   Years of education: Not on file   Highest  education level: Not on file  Occupational History   Occupation: Retired  Tobacco Use   Smoking status: Former    Years: 30.00    Types: Cigarettes, Cigars    Quit date: 12/15/1993    Years since quitting: 28.0   Smokeless tobacco: Never   Tobacco comments:    occasional cigar  Vaping Use   Vaping Use: Never used  Substance and Sexual Activity   Alcohol use: Yes    Alcohol/week: 16.0 standard drinks    Types: 1 Cans of beer, 1 Shots of liquor, 14 Standard drinks or equivalent per week    Comment: drinks 2 martini's a night (2 shots in each)   Drug use: No   Sexual activity: Yes  Other Topics Concern   Not on file  Social History Narrative   Tries to remain active.  Frequent golfer but claudication limits this.   Social Determinants of Health   Financial Resource Strain: Not on file  Food Insecurity: Not on file  Transportation Needs: Not on file  Physical Activity: Not on file  Stress: Not on file  Social Connections: Not on file  Intimate Partner Violence: Not on file    Review of Systems:    Constitutional: No weight loss, fever or chills Cardiovascular: No chest pain Respiratory: No SOB  Gastrointestinal: See HPI and otherwise negative   Physical Exam:  Vital signs: BP 120/70    Pulse 70    Ht 5' 8" (1.727 m)    Wt 179 lb 9.6 oz (81.5 kg)    SpO2 94%    BMI 27.31 kg/m    Constitutional:   Pleasant Elderly Caucasian male  appears to be in NAD, Well developed, Well nourished, alert and cooperative Respiratory: Respirations even and unlabored. Lungs clear to auscultation bilaterally.   No wheezes, crackles, or rhonchi.  Cardiovascular: Normal S1, S2. No MRG. Regular rate and rhythm. No peripheral edema, cyanosis or pallor.  Gastrointestinal:  Soft, nondistended, nontender. No rebound or guarding. Normal bowel sounds. No appreciable masses or hepatomegaly. Rectal:  Not performed.  Psychiatric: Demonstrates good judgement and reason without abnormal affect or  behaviors.  RELEVANT LABS AND IMAGING: CBC    Component Value Date/Time   WBC 7.3 12/25/2020 1008   RBC 5.57 12/25/2020 1008   HGB 17.3 (H) 12/25/2020 1008   HGB 15.9 11/04/2020 1529   HCT 53.9 (H) 12/25/2020 1008   HCT 47.4 11/04/2020 1529   PLT 222 12/25/2020 1008   PLT 216 11/04/2020 1529   MCV 96.8 12/25/2020 1008   MCV 95 11/04/2020 1529   MCH 31.1 12/25/2020 1008   MCHC 32.1 12/25/2020 1008   RDW 14.7 12/25/2020 1008   RDW 13.3 11/04/2020 1529   LYMPHSABS 2.0 12/07/2020 2356   MONOABS 1.4 (H) 12/07/2020 2356   EOSABS 0.1 12/07/2020 2356   BASOSABS 0.1 12/07/2020 2356    CMP     Component Value Date/Time   NA 139 04/21/2021 1042   NA 140 11/04/2020 1529   K 4.8 04/21/2021 1042   CL 103 04/21/2021 1042   CO2 29 04/21/2021 1042   GLUCOSE 265 (H) 04/21/2021 1042   BUN 23 04/21/2021 1042   BUN 20 11/04/2020 1529   CREATININE 1.00 07/02/2021 1004   CREATININE 0.74 09/28/2016 1146   CALCIUM 9.4 04/21/2021 1042   PROT 6.8 04/21/2021 1042   PROT 6.9 09/30/2020 1606   ALBUMIN 3.8 04/21/2021 1042   ALBUMIN 4.3 09/30/2020 1606   AST 23 04/21/2021 1042   ALT 21 04/21/2021 1042   ALKPHOS 62 04/21/2021 1042   BILITOT 1.0 04/21/2021 1042   BILITOT 0.4 09/30/2020 1606   GFRNONAA >60 04/21/2021 1042   GFRAA 68 11/04/2020 1529    Assessment: 1.  Epigastric pain: Increase in epigastric pain which started at the beginning of November, seems to wax and wane in intensity, some better after the addition of Carafate 3 times daily and increase in Pantoprazole to 40 twice daily; consider gastritis +/- PUD 2.  GERD 3.  History of gastric ulcers  Plan: 1.  Patient reports today that he is improving slowly, symptoms seem to be less frequent and he has certainly had no further vomiting.  Offered him further diagnostic testing with EGD or other imaging versus adding additional medication but he would like to continue what he is doing for now and see if he continues to improve. 2.   Continue Pantoprazole 40 twice daily, 30-60 minutes before breakfast and dinner.  Told patient to call if he needs refills 3.  Continue Carafate 1 g 3 times daily (patient unable to fit in the fourth dose a day), 1 hour before or 2 hours after eating and other medications, patient will call if he needs refills 4.  Patient will follow with Dr. Fuller Plan in 2 to 3 months.  Ellouise Newer, PA-C [facility] 12/17/2021, 9:27 AM  Cc: Shon Baton, MD

## 2021-12-17 NOTE — Patient Instructions (Signed)
If you are age 77 or older, your body mass index should be between 23-30. Your Body mass index is 27.31 kg/m. If this is out of the aforementioned range listed, please consider follow up with your Primary Care Provider.  If you are age 67 or younger, your body mass index should be between 19-25. Your Body mass index is 27.31 kg/m. If this is out of the aformentioned range listed, please consider follow up with your Primary Care Provider.   ________________________________________________________  The [place] [facility] providers would like to encourage you to use [facility] to communicate with providers for non-urgent requests or questions.  Due to long hold times on the telephone, sending your provider a message by [facility] may be a faster and more efficient way to get a response.  Please allow 48 business hours for a response.  Please remember that this is for non-urgent requests.  _______________________________________________________   Follow up with Dr Fuller Plan  I appreciate the  opportunity to care for you  Thank You   [name] [name]

## 2021-12-22 ENCOUNTER — Other Ambulatory Visit: Payer: Self-pay

## 2021-12-22 ENCOUNTER — Ambulatory Visit (INDEPENDENT_AMBULATORY_CARE_PROVIDER_SITE_OTHER): Payer: Medicare Other

## 2021-12-22 DIAGNOSIS — Z5181 Encounter for therapeutic drug level monitoring: Secondary | ICD-10-CM | POA: Diagnosis not present

## 2021-12-22 DIAGNOSIS — I359 Nonrheumatic aortic valve disorder, unspecified: Secondary | ICD-10-CM | POA: Diagnosis not present

## 2021-12-22 LAB — POCT INR: INR: 2.3 (ref 2.0–3.0)

## 2021-12-22 NOTE — Patient Instructions (Signed)
Description   Take 1.5 tablets today and then continue same dosage 1 tablet daily except for 1.5 tablets on Thursdays.    Recheck INR in 4 weeks.   Coumadin Clinic for any changes in medications or upcoming procedures. [phone]

## 2021-12-23 DIAGNOSIS — Z8711 Personal history of peptic ulcer disease: Secondary | ICD-10-CM

## 2021-12-23 DIAGNOSIS — R112 Nausea with vomiting, unspecified: Secondary | ICD-10-CM

## 2021-12-23 DIAGNOSIS — R1013 Epigastric pain: Secondary | ICD-10-CM

## 2021-12-23 DIAGNOSIS — K219 Gastro-esophageal reflux disease without esophagitis: Secondary | ICD-10-CM

## 2021-12-24 ENCOUNTER — Telehealth: Payer: Self-pay

## 2021-12-24 NOTE — Telephone Encounter (Addendum)
Patient with diagnosis of AFib and mechanical AVR on warfarin for anticoagulation.    Procedure: Endoscopy Date of procedure: 01/07/22  CHA2DS2-VASc Score = 8  This indicates a 10.8% annual risk of stroke. The patient's score is based upon: CHF History: 1 HTN History: 1 Diabetes History: 1 Stroke History: 2 Vascular Disease History: 1 Age Score: 2 Gender Score: 0  CrCl 72 mL/min Platelet count 222K  Per office protocol, patient can hold Coumadin for 5 days prior to procedure. Patient will need bridging with Lovenox 1mg /kg BID around procedure. This will be coordinated at [facility] clinic where pt is followed. INR appt will need to be moved (currently scheduled for 2/13 but procedure is 2/1 and will need to be seen 1 week prior to arrange bridge). Left message for pt to move INR appt up.

## 2021-12-24 NOTE — Telephone Encounter (Signed)
[facility] Pre-operative Risk Assessment     Request for surgical clearance:     Endoscopy Procedure  What type of surgery is being performed?     Endoscopy  When is this surgery scheduled?     01/07/22  What type of clearance is required ?   Pharmacy  Are there any medications that need to be held prior to surgery and how long? Coumadin for 5-7 days  Practice name and name of physician performing surgery?      [facility]  What is your office phone and fax number?      Phone- [phone]  [phone]  Anesthesia type (None, local, MAC, general) ?       MAC

## 2021-12-24 NOTE — Telephone Encounter (Signed)
Clinical pharmacist to review Coumadin.  Patient has a history of mechanical AVR and TIA.  Previously required Lovenox bridging according to Dr. Claris Gladden note.

## 2021-12-24 NOTE — Telephone Encounter (Signed)
Called and spoke with patient. He has been scheduled for an EGD in the [place] with Dr. Fuller Plan on Wednesday, 01/07/22 at 10 am. Pt is aware that he will need to arrive on the 4th floor by 9 am with a care partner. Pt is aware that I have requested clearance to hold his Coumadin for 5-7 days prior to his procedure. He knows that I will be in contact once we receive a response from cardiology. Pt is also a Type II diabetic on oral medications (Invokana and Glyburide). Pt reports that he has access to his my chart. He knows that I will send his instructions once I have clearance. Pt verbalized understanding of all information and had no concerns at the end of the call.   Ambulatory referral to GI in epic.

## 2021-12-24 NOTE — Telephone Encounter (Signed)
"  Patient needs to be scheduled for an EGD with Dr. Fuller Plan in the [facility]  He is on Coumadin which will need to be held 5-7 days.  Clearance will need to be sent to his cardiologist who I believe is within [facility].  He will likely have to go on Lovenox in the interim as he has a valve repair.  Can you please send clearance and see if we can get him set up for an EGD due to his ongoing epigastric pain and vomiting.   Thanks, JL L "

## 2021-12-25 NOTE — Telephone Encounter (Signed)
Pt returned the call and he is aware that he will need to move appt up since he is having a procedure on 01/07/2022 and has to hold warfarin. Appt rescheduled to next week on 12/30/2021 for Lovenox instructions.

## 2021-12-29 DIAGNOSIS — L814 Other melanin hyperpigmentation: Secondary | ICD-10-CM | POA: Diagnosis not present

## 2021-12-29 DIAGNOSIS — L918 Other hypertrophic disorders of the skin: Secondary | ICD-10-CM | POA: Diagnosis not present

## 2021-12-29 DIAGNOSIS — L570 Actinic keratosis: Secondary | ICD-10-CM | POA: Diagnosis not present

## 2021-12-29 DIAGNOSIS — Z85828 Personal history of other malignant neoplasm of skin: Secondary | ICD-10-CM | POA: Diagnosis not present

## 2021-12-30 ENCOUNTER — Ambulatory Visit (INDEPENDENT_AMBULATORY_CARE_PROVIDER_SITE_OTHER): Payer: Medicare Other

## 2021-12-30 ENCOUNTER — Other Ambulatory Visit: Payer: Self-pay

## 2021-12-30 ENCOUNTER — Other Ambulatory Visit: Payer: Self-pay | Admitting: Cardiovascular Disease

## 2021-12-30 DIAGNOSIS — G459 Transient cerebral ischemic attack, unspecified: Secondary | ICD-10-CM | POA: Diagnosis not present

## 2021-12-30 DIAGNOSIS — Z5181 Encounter for therapeutic drug level monitoring: Secondary | ICD-10-CM

## 2021-12-30 DIAGNOSIS — I359 Nonrheumatic aortic valve disorder, unspecified: Secondary | ICD-10-CM

## 2021-12-30 LAB — POCT INR: INR: 2.8 (ref 2.0–3.0)

## 2021-12-30 MED ORDER — ENOXAPARIN SODIUM 80 MG/0.8ML IJ SOSY: 120.0000 mg | PREFILLED_SYRINGE | INTRAMUSCULAR | 0 refills | Status: DC

## 2021-12-30 NOTE — Progress Notes (Signed)
Pt was cleared for BID Lovenox dosing, but in the exam room, pt was adamant that he wants once daily dosing.  After speaking w/ pharmacist, advised pt that recent studies show that BID dosing is best for his mechanical valve.  Pt states that he "does not care", that he has previously done once daily dosing, most recently in Feb 2022 and he will not take BID.  [name] [name], RPh came in to speak w/ pt and he reiterated to her his request.  He asks about how recent this study is and how accurate it is.  He states that he understands the risks and will assume them himself and proceed w/ once daily dosing.

## 2021-12-30 NOTE — Patient Instructions (Addendum)
-   continue same dosage 1 tablet daily except for 1.5 tablets on Thursdays.    Recheck INR after your procedure Coumadin Clinic for any changes in medications or upcoming procedures. [phone] FOLLOW INSTRUCTIONS FOR LOVENOX BRIDGE   Thursday, Jan 26: Last dose of warfarin.  Friday, Jan 27: No warfarin or enoxaparin (Lovenox).  Saturday, Jan 28: Inject enoxaparin 120 mg in the fatty abdominal tissue at least 2 inches from the belly button once a day about 24 hours apart, 8pm rotate sites. No warfarin.  Sunday, Jan 29: Inject enoxaparin in the fatty tissue every 24 hours, 8pm. No warfarin.  Monday, Jan 30: Inject enoxaparin in the fatty tissue every 24 hours,  8pm. No warfarin.  Tuesday, Jan 31: No warfarin or enoxaparin.  Wednesday, Feb 1: Procedure Day - No enoxaparin - Resume warfarin in the evening or as directed by doctor (take an extra half tablet with usual dose for 2 days then resume normal dose).  Thursday, Feb 2: Resume enoxaparin inject in the fatty tissue every 24 hours 8 am and take warfarin w/ extra 1/2 dose  Friday, Feb 3: Inject enoxaparin in the fatty tissue every 24 hours 8 am and take warfarin  Saturday, Feb 4: Inject enoxaparin in the fatty tissue every 24 hours 8 am and take warfarin  Sunday, Feb 5: Inject enoxaparin in the fatty tissue every 24  hours 8 am and take warfarin  Monday, Feb 6: Inject enoxaparin in the fatty tissue every 24 hours 8 am and take warfarin  Tuesday, Feb 7: warfarin appt to check INR.

## 2021-12-31 DIAGNOSIS — R5383 Other fatigue: Secondary | ICD-10-CM | POA: Diagnosis not present

## 2021-12-31 DIAGNOSIS — J069 Acute upper respiratory infection, unspecified: Secondary | ICD-10-CM | POA: Diagnosis not present

## 2021-12-31 DIAGNOSIS — E1122 Type 2 diabetes mellitus with diabetic chronic kidney disease: Secondary | ICD-10-CM | POA: Diagnosis not present

## 2021-12-31 DIAGNOSIS — I4819 Other persistent atrial fibrillation: Secondary | ICD-10-CM | POA: Diagnosis not present

## 2021-12-31 DIAGNOSIS — R0981 Nasal congestion: Secondary | ICD-10-CM | POA: Diagnosis not present

## 2021-12-31 DIAGNOSIS — C3492 Malignant neoplasm of unspecified part of left bronchus or lung: Secondary | ICD-10-CM | POA: Diagnosis not present

## 2021-12-31 DIAGNOSIS — Z1152 Encounter for screening for COVID-19: Secondary | ICD-10-CM | POA: Diagnosis not present

## 2021-12-31 DIAGNOSIS — Z20828 Contact with and (suspected) exposure to other viral communicable diseases: Secondary | ICD-10-CM | POA: Diagnosis not present

## 2021-12-31 DIAGNOSIS — R059 Cough, unspecified: Secondary | ICD-10-CM | POA: Diagnosis not present

## 2021-12-31 DIAGNOSIS — Z7901 Long term (current) use of anticoagulants: Secondary | ICD-10-CM | POA: Diagnosis not present

## 2021-12-31 DIAGNOSIS — J449 Chronic obstructive pulmonary disease, unspecified: Secondary | ICD-10-CM | POA: Diagnosis not present

## 2022-01-01 DIAGNOSIS — R599 Enlarged lymph nodes, unspecified: Secondary | ICD-10-CM | POA: Diagnosis not present

## 2022-01-01 DIAGNOSIS — Z1152 Encounter for screening for COVID-19: Secondary | ICD-10-CM | POA: Diagnosis not present

## 2022-01-01 DIAGNOSIS — J029 Acute pharyngitis, unspecified: Secondary | ICD-10-CM | POA: Diagnosis not present

## 2022-01-04 DIAGNOSIS — I2510 Atherosclerotic heart disease of native coronary artery without angina pectoris: Secondary | ICD-10-CM | POA: Diagnosis not present

## 2022-01-04 DIAGNOSIS — J449 Chronic obstructive pulmonary disease, unspecified: Secondary | ICD-10-CM | POA: Diagnosis not present

## 2022-01-04 DIAGNOSIS — I1310 Hypertensive heart and chronic kidney disease without heart failure, with stage 1 through stage 4 chronic kidney disease, or unspecified chronic kidney disease: Secondary | ICD-10-CM | POA: Diagnosis not present

## 2022-01-04 DIAGNOSIS — N1831 Chronic kidney disease, stage 3a: Secondary | ICD-10-CM | POA: Diagnosis not present

## 2022-01-05 ENCOUNTER — Ambulatory Visit (HOSPITAL_BASED_OUTPATIENT_CLINIC_OR_DEPARTMENT_OTHER)
Admission: RE | Admit: 2022-01-05 | Discharge: 2022-01-05 | Disposition: A | Discharge status: Home / Self Care | Payer: Medicare Other | Source: Ambulatory Visit | Attending: Radiation Oncology | Admitting: Radiation Oncology

## 2022-01-05 ENCOUNTER — Telehealth: Payer: Self-pay

## 2022-01-05 ENCOUNTER — Other Ambulatory Visit: Payer: Self-pay

## 2022-01-05 DIAGNOSIS — I700 Atherosclerosis of aorta: Secondary | ICD-10-CM | POA: Diagnosis not present

## 2022-01-05 DIAGNOSIS — R911 Solitary pulmonary nodule: Secondary | ICD-10-CM | POA: Diagnosis not present

## 2022-01-05 DIAGNOSIS — C3490 Malignant neoplasm of unspecified part of unspecified bronchus or lung: Secondary | ICD-10-CM | POA: Diagnosis not present

## 2022-01-05 DIAGNOSIS — J439 Emphysema, unspecified: Secondary | ICD-10-CM | POA: Diagnosis not present

## 2022-01-05 DIAGNOSIS — C3492 Malignant neoplasm of unspecified part of left bronchus or lung: Secondary | ICD-10-CM | POA: Insufficient documentation

## 2022-01-05 LAB — POCT I-STAT CREATININE: Creatinine, Ser: 1 mg/dL (ref 0.61–1.24)

## 2022-01-05 MED ORDER — IOHEXOL 300 MG/ML  SOLN
75.0000 mL | Freq: Once | INTRAMUSCULAR | Status: AC | PRN
  Administered 2022-01-05: 75 mL via INTRAVENOUS

## 2022-01-05 NOTE — Telephone Encounter (Signed)
Pt called and stated his Lovenox bridge instructions stated he would take 120mg  every 24hrs; however the prescription sent into pharmacy was Lovenox 80mg  injections. Verified with pt that he has been doing  injections twice a day since he has 80mg  and I also provided him with "day before procedure instructions". Pt verbalized understanding.

## 2022-01-06 ENCOUNTER — Telehealth: Payer: Self-pay | Admitting: Physician Assistant

## 2022-01-06 NOTE — Telephone Encounter (Signed)
Returned pt's call. Reviewed procedure instructions for when to stop eating, NPO time and medications to hold. Pt states he did not receive instructions and they were not made available via MyChart. Pt was aware of holding his Coumadin and has held since 01/02/22 and is on the Lovenox bridge. Pt verbalized understanding of instructions and was appreciative of the call back.

## 2022-01-06 NOTE — Telephone Encounter (Signed)
Inbound call from patient states he would like a call back to discuss eating and prep for procedure 01/07/22

## 2022-01-07 ENCOUNTER — Ambulatory Visit (AMBULATORY_SURGERY_CENTER): Payer: Medicare Other | Admitting: Gastroenterology

## 2022-01-07 ENCOUNTER — Encounter: Payer: Self-pay | Admitting: Gastroenterology

## 2022-01-07 VITALS — BP 157/46 | HR 65 | Temp 98.9°F | Resp 18 | Ht 68.0 in | Wt 179.0 lb

## 2022-01-07 DIAGNOSIS — R1013 Epigastric pain: Secondary | ICD-10-CM | POA: Diagnosis not present

## 2022-01-07 DIAGNOSIS — K449 Diaphragmatic hernia without obstruction or gangrene: Secondary | ICD-10-CM | POA: Diagnosis not present

## 2022-01-07 DIAGNOSIS — R112 Nausea with vomiting, unspecified: Secondary | ICD-10-CM

## 2022-01-07 DIAGNOSIS — K219 Gastro-esophageal reflux disease without esophagitis: Secondary | ICD-10-CM

## 2022-01-07 DIAGNOSIS — K319 Disease of stomach and duodenum, unspecified: Secondary | ICD-10-CM | POA: Diagnosis not present

## 2022-01-07 DIAGNOSIS — Z8711 Personal history of peptic ulcer disease: Secondary | ICD-10-CM

## 2022-01-07 MED ORDER — SODIUM CHLORIDE 0.9 % IV SOLN: 500.0000 mL | Freq: Once | INTRAVENOUS | Status: DC

## 2022-01-07 NOTE — Patient Instructions (Signed)
Information on gastritis and hiatal hernia given to you today.  Await pathology results from the biopsies taken today.  Resume Warafin (Coumadin) tomorrow and Lo0venox today at prior doses.  Resume previous diet and all other medications.    YOU HAD AN ENDOSCOPIC PROCEDURE TODAY AT [facility]:   Refer to the procedure report that was given to you for any specific questions about what was found during the examination.  If the procedure report does not answer your questions, please call your gastroenterologist to clarify.  If you requested that your care partner not be given the details of your procedure findings, then the procedure report has been included in a sealed envelope for you to review at your convenience later.  YOU SHOULD EXPECT: Some feelings of bloating in the abdomen. Passage of more gas than usual.  Walking can help get rid of the air that was put into your GI tract during the procedure and reduce the bloating. If you had a lower endoscopy (such as a colonoscopy or flexible sigmoidoscopy) you may notice spotting of blood in your stool or on the toilet paper. If you underwent a bowel prep for your procedure, you may not have a normal bowel movement for a few days.  Please Note:  You might notice some irritation and congestion in your nose or some drainage.  This is from the oxygen used during your procedure.  There is no need for concern and it should clear up in a day or so.  SYMPTOMS TO REPORT IMMEDIATELY:   Following upper endoscopy (EGD)  Vomiting of blood or coffee ground material  New chest pain or pain under the shoulder blades  Painful or persistently difficult swallowing  New shortness of breath  Fever of 100F or higher  Black, tarry-looking stools  For urgent or emergent issues, a gastroenterologist can be reached at any hour by calling [phone]. Do not use MyChart messaging for urgent concerns.    DIET:  We do recommend a small meal at  first, but then you may proceed to your regular diet.  Drink plenty of fluids but you should avoid alcoholic beverages for 24 hours.  ACTIVITY:  You should plan to take it easy for the rest of today and you should NOT DRIVE or use heavy machinery until tomorrow (because of the sedation medicines used during the test).    FOLLOW UP: Our staff will call the number listed on your records 48-72 hours following your procedure to check on you and address any questions or concerns that you may have regarding the information given to you following your procedure. If we do not reach you, we will leave a message.  We will attempt to reach you two times.  During this call, we will ask if you have developed any symptoms of COVID 19. If you develop any symptoms (ie: fever, flu-like symptoms, shortness of breath, cough etc.) before then, please call [phone].  If you test positive for Covid 19 in the 2 weeks post procedure, please call and report this information to [place].    If any biopsies were taken you will be contacted by phone or by letter within the next 1-3 weeks.  Please call us at [phone] if you have not heard about the biopsies in 3 weeks.    SIGNATURES/CONFIDENTIALITY: You and/or your care partner have signed paperwork which will be entered into your electronic medical record.  These signatures attest to the fact that that the information above  on your After Visit Summary has been reviewed and is understood.  Full responsibility of the confidentiality of this discharge information lies with you and/or your care-partner.

## 2022-01-07 NOTE — Progress Notes (Signed)
VSS, transported to PACU °

## 2022-01-07 NOTE — Progress Notes (Signed)
Radiation Oncology         [phone] ________________________________  Name: [name] [name] MRN: [id number]  Date: 01/08/2022  DOB: 07-24-1946  Follow-Up Visit Note  CC: Shon Baton, MD  Garner Nash, DO    ICD-10-CM   1. Squamous cell carcinoma of left lung ([place])  C34.92 CT Chest W Contrast    2. 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    Chest CT on 01/05/22 demonstrated stable post treatment changes within the inferior lingula, and no findings suggestive of local tumor recurrence or metastatic disease. Bilateral avascular necrosis within the humeral heads was also appreciated, as well as aortic atherosclerosis and emphysema.        Of note: the patient underwent endoscopy yesterday (01/07/22) due to complaints of epigastric abdominal pain. Pathology is pending at this time.  He recently completed a course of antibiotics for sinusitis.  He feels better after receiving these antibiotics.                  Allergies:  has No Known Allergies.  Meds: Current Outpatient Medications  Medication Sig Dispense Refill   amiodarone (PACERONE) 200 MG tablet Take 0.5 tablets (100 mg total) by mouth daily. 90 tablet 3   amoxicillin (AMOXIL) 500 MG capsule Take 2,000 mg by mouth See admin instructions. Take 2000 mg by mouth one hour prior to dental procedures     atorvastatin (LIPITOR) 80 MG tablet Take 80 mg by mouth daily.     enoxaparin (LOVENOX) 80 MG/0.8ML  injection Inject 1.2 mLs (120 mg total) into the skin daily for 10 days. 12 mL 0   ENTRESTO 24-26 MG TAKE 1 TABLET BY MOUTH TWICE A DAY 60 tablet 6   Evolocumab 140 MG/ML SOAJ Inject 140 mg into the skin every 14 (fourteen) days.     furosemide (LASIX) 20 MG tablet TAKE 2 TABLETS (40 MG TOTAL) BY MOUTH DAILY. 180 tablet 1   glyBURIDE (DIABETA) 2.5 MG tablet Take 5 mg by mouth 2 (two) times daily with a meal.     INVOKANA 100 MG TABS tablet Take 100 mg by mouth daily.     nitroGLYCERIN (NITROSTAT) 0.4 MG SL tablet Place 1 tablet (0.4 mg total) under the tongue every 5 (five) minutes x 3 doses as needed for chest pain. 25 tablet 12   pantoprazole (PROTONIX) 40 MG tablet Take 1 tablet (40 mg total) by mouth 2 (two) times daily before a meal. 60 tablet 5   sildenafil (VIAGRA) 100 MG tablet      spironolactone (ALDACTONE) 25 MG tablet TAKE 1 TABLET BY MOUTH EVERY DAY 90 tablet 3   sucralfate (CARAFATE) 1 g tablet Take 1 tablet (1 g total) by mouth 4 (four) times daily -  with meals and at bedtime. 120 tablet 2   warfarin (COUMADIN) 5 MG tablet TAKE 1 TO 1 AND 1/2 TABLETS BY MOUTH DAILY AS PRESCRIBED BY THE COUMADIN CLINIC. 120 tablet 1  Blood Glucose Monitoring Suppl (ONETOUCH VERIO IQ SYSTEM) w/Device KIT Check blood sugar 2x a day Dx-E11.51 (Patient not taking: Reported on 01/08/2022)     glucose blood (ONETOUCH VERIO) test strip use 1 strip qd to check cbg Dx:  E11.51 (Patient not taking: Reported on 01/08/2022)     OneTouch Delica Lancets 91Q MISC Check blood sugar 2x a day Dx-E11.51 (Patient not taking: Reported on 01/08/2022)     No current facility-administered medications for this encounter.    Physical Findings: The patient is in no acute distress. Patient is alert and oriented.  height is 5' 8"  (1.727 m) and weight is 174 lb 6 oz (79.1 kg). His temporal temperature is 96.3 F (35.7 C) (abnormal). His blood pressure is 150/44 (abnormal) and his pulse is 74. His respiration is 18 and oxygen  saturation is 95%. .   Lungs are clear to auscultation bilaterally. Heart has regular rate and rhythm. No palpable cervical, supraclavicular, or axillary adenopathy. Abdomen soft, non-tender, normal bowel sounds.    Lab Findings: Lab Results  Component Value Date   WBC 7.3 12/25/2020   HGB 17.3 (H) 12/25/2020   HCT 53.9 (H) 12/25/2020   MCV 96.8 12/25/2020   PLT 222 12/25/2020    Radiographic Findings: CT Chest W Contrast  Result Date: 01/06/2022 CLINICAL DATA:  Restaging non-small cell lung cancer. EXAM: CT CHEST WITH CONTRAST TECHNIQUE: Multidetector CT imaging of the chest was performed during intravenous contrast administration. RADIATION DOSE REDUCTION: This exam was performed according to the departmental dose-optimization program which includes automated exposure control, adjustment of the mA and/or kV according to patient size and/or use of iterative reconstruction technique. CONTRAST:  74m OMNIPAQUE IOHEXOL 300 MG/ML  SOLN COMPARISON:  None. FINDINGS: Cardiovascular: Heart size appears upper limits of normal. No pericardial effusion. Previous median sternotomy and CABG procedure. Aortic atherosclerosis. Mediastinum/Nodes: The thyroid gland, trachea, and esophagus demonstrate no significant findings. No enlarged axillary or supraclavicular lymph nodes. Unchanged appearance of prominent mediastinal lymph nodes including high right paratracheal lymph node measuring 1.1 cm, image 29/2. No enlarged hilar lymph nodes. Lungs/Pleura: Mild centrilobular emphysema. No pleural effusion, airspace consolidation, or pneumothorax. Within the inferior lingula there is an area of chronic masslike architectural distortion surrounding fiducial markers compatible with post treatment changes. The appearance does not appear significantly changed in the interval, image 94/4. Stable tiny millimetric lung nodule within the posterolateral left upper lobe, image 46/4. Peripheral nodule in the right middle lobe is  stable measuring 5 mm, image 108/4. No new or suspicious lung nodules identified. Upper Abdomen: No acute abnormality within the imaged portions of the upper abdomen. Aortic atherosclerosis with branch vessel involvement. The adrenal glands appear unremarkable. Musculoskeletal: Signs of bilateral avascular necrosis within the humeral heads, unchanged when compared with previous exam. IMPRESSION: 1. Stable post treatment changes within the inferior lingula. No findings to suggest local tumor recurrence or metastatic disease. 2. Bilateral avascular necrosis within the humeral heads. 3. Aortic Atherosclerosis (ICD10-I70.0) and Emphysema (ICD10-J43.9). Electronically Signed   By: TKerby MoorsM.D.   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  Recent chest CT scan also shows favorable response to his SBRT.  Plan: Routine follow-up in 6 months.  Prior to this follow-up appointment the patient will undergo a CT scan of the chest.   20 minutes of total time was spent for this patient encounter,  including preparation, face-to-face counseling with the patient and coordination of care, physical exam, and documentation of the encounter. ____________________________________  Blair Promise, PhD, MD  This document serves as a record of services personally performed by Gery Pray, MD. It was created on his behalf by Roney Mans, a trained medical scribe. The creation of this record is based on the scribe's personal observations and the provider's statements to them. This document has been checked and approved by the attending provider.

## 2022-01-07 NOTE — Progress Notes (Signed)
Called to room to assist during endoscopic procedure.  Patient ID and intended procedure confirmed with present staff. Received instructions for my participation in the procedure from the performing physician.  

## 2022-01-07 NOTE — Op Note (Signed)
[facility] Patient Name: [name] [name] Procedure Date: 01/07/2022 9:42 AM MRN: [id number] Endoscopist: Ladene Artist , MD Age: 76 Referring MD:  Date of Birth: January 21, 1946 Gender: Male Account #: [id number] Procedure:                Upper GI endoscopy Indications:              Epigastric abdominal pain Medicines:                Monitored Anesthesia Care Procedure:                Pre-Anesthesia Assessment:                           - Prior to the procedure, a History and Physical                            was performed, and patient medications and                            allergies were reviewed. The patient's tolerance of                            previous anesthesia was also reviewed. The risks                            and benefits of the procedure and the sedation                            options and risks were discussed with the patient.                            All questions were answered, and informed consent                            was obtained. Prior Anticoagulants: The patient has                            taken Coumadin (warfarin), last dose was 5 days                            prior to procedure and Lovenox, last dose 1 day                            prior to procedure. ASA Grade Assessment: III - A                            patient with severe systemic disease. After                            reviewing the risks and benefits, the patient was                            deemed in satisfactory condition to undergo the  procedure.                           After obtaining informed consent, the endoscope was                            passed under direct vision. Throughout the                            procedure, the patient's blood pressure, pulse, and                            oxygen saturations were monitored continuously. The                            GIF [id number] #[id number] was introduced through the                             mouth, and advanced to the second part of duodenum.                            The upper GI endoscopy was accomplished without                            difficulty. The patient tolerated the procedure                            well. Scope In: Scope Out: Findings:                 The examined esophagus was normal.                           A small hiatal hernia was present.                           Patchy mildly erythematous mucosa without bleeding                            was found in the gastric body. Biopsies were taken                            with a cold forceps for histology.                           The exam of the stomach was otherwise normal.                           The duodenal bulb and second portion of the                            duodenum were normal. Complications:            No immediate complications. Estimated Blood Loss:     Estimated blood loss was minimal. Impression:               -  Normal esophagus.                           - Small hiatal hernia.                           - Erythematous mucosa in the gastric body. Biopsied.                           - Normal duodenal bulb and second portion of the                            duodenum. Recommendation:           - Patient has a contact number available for                            emergencies. The signs and symptoms of potential                            delayed complications were discussed with the                            patient. Return to normal activities tomorrow.                            Written discharge instructions were provided to the                            patient.                           - Resume previous diet.                           - Continue present medications including                            pantoprazole and sucralfate.                           - Await pathology results.                           - Resume Coumadin (warfarin) tomorrow and Lovenox                             (enoxaparin) today at prior doses. Refer to                            managing physician for further adjustment of                            therapy. Ladene Artist, MD 01/07/2022 10:01:13 AM This report has been signed electronically.

## 2022-01-07 NOTE — Progress Notes (Signed)
See 12/17/2021 H&P, no changes.

## 2022-01-08 ENCOUNTER — Ambulatory Visit
Admission: RE | Admit: 2022-01-08 | Discharge: 2022-01-08 | Disposition: A | Discharge status: Home / Self Care | Payer: Medicare Other | Source: Ambulatory Visit | Attending: Radiation Oncology | Admitting: Radiation Oncology

## 2022-01-08 ENCOUNTER — Encounter: Payer: Self-pay | Admitting: Radiation Oncology

## 2022-01-08 ENCOUNTER — Other Ambulatory Visit: Payer: Self-pay

## 2022-01-08 VITALS — BP 150/44 | HR 74 | Temp 96.3°F | Resp 18 | Ht 68.0 in | Wt 174.4 lb

## 2022-01-08 DIAGNOSIS — Z79899 Other long term (current) drug therapy: Secondary | ICD-10-CM | POA: Insufficient documentation

## 2022-01-08 DIAGNOSIS — Z923 Personal history of irradiation: Secondary | ICD-10-CM | POA: Diagnosis not present

## 2022-01-08 DIAGNOSIS — R1013 Epigastric pain: Secondary | ICD-10-CM | POA: Insufficient documentation

## 2022-01-08 DIAGNOSIS — Z85118 Personal history of other malignant neoplasm of bronchus and lung: Secondary | ICD-10-CM | POA: Diagnosis not present

## 2022-01-08 DIAGNOSIS — I700 Atherosclerosis of aorta: Secondary | ICD-10-CM | POA: Insufficient documentation

## 2022-01-08 DIAGNOSIS — J432 Centrilobular emphysema: Secondary | ICD-10-CM | POA: Diagnosis not present

## 2022-01-08 DIAGNOSIS — C3492 Malignant neoplasm of unspecified part of left bronchus or lung: Secondary | ICD-10-CM

## 2022-01-08 DIAGNOSIS — Z08 Encounter for follow-up examination after completed treatment for malignant neoplasm: Secondary | ICD-10-CM | POA: Diagnosis not present

## 2022-01-08 DIAGNOSIS — C3410 Malignant neoplasm of upper lobe, unspecified bronchus or lung: Secondary | ICD-10-CM

## 2022-01-08 DIAGNOSIS — Z7984 Long term (current) use of oral hypoglycemic drugs: Secondary | ICD-10-CM | POA: Diagnosis not present

## 2022-01-08 DIAGNOSIS — C3412 Malignant neoplasm of upper lobe, left bronchus or lung: Secondary | ICD-10-CM | POA: Diagnosis not present

## 2022-01-08 NOTE — Progress Notes (Signed)
[name] [name] is here today for follow up post radiation to the lung.  Lung Side: left  Completed radiation treatment on: 02/25/2021  Does the patient complain of any of the following: Pain:denies Shortness of breath w/wo exertion: denies Cough: yes, currently has sinus infection Hemoptysis: denies Pain with swallowing: denies Swallowing/choking concerns: denies Appetite: good Energy Level: good Post radiation skin Changes: denies    Additional comments if applicable: denies  Vitals:   01/08/22 0959  BP: (!) 150/44  Pulse: 74  Resp: 18  Temp: (!) 96.3 F (35.7 C)  TempSrc: Temporal  SpO2: 95%  Weight: 174 lb 6 oz (79.1 kg)  Height: 5\' 8"  (1.727 m)

## 2022-01-09 ENCOUNTER — Telehealth: Payer: Self-pay | Admitting: *Deleted

## 2022-01-09 NOTE — Telephone Encounter (Signed)
°  Follow up Call-  Call back number 01/07/2022 08/30/2019  Post procedure Call Back phone  # [phone]  Permission to leave phone message Yes Yes  Some recent data might be hidden     Patient questions:  Do you have a fever, pain , or abdominal swelling? No. Pain Score  0 *  Have you tolerated food without any problems? Yes.    Have you been able to return to your normal activities? Yes.    Do you have any questions about your discharge instructions: Diet   No. Medications  No. Follow up visit  No.  Do you have questions or concerns about your Care? No.  Actions: * If pain score is 4 or above: No action needed, pain <4.  Have you developed a fever since your procedure? no  2.   Have you had an respiratory symptoms (SOB or cough) since your procedure? no  3.   Have you tested positive for COVID 19 since your procedure no  4.   Have you had any family members/close contacts diagnosed with the COVID 19 since your procedure?  no   If yes to any of these questions please route to Joylene [name], RN and Joella Prince, RN

## 2022-01-09 NOTE — Telephone Encounter (Signed)
No answer on first follow call. Left message

## 2022-01-12 ENCOUNTER — Other Ambulatory Visit: Payer: Self-pay

## 2022-01-12 MED ORDER — ENOXAPARIN SODIUM 80 MG/0.8ML IJ SOSY: 80.0000 mg | PREFILLED_SYRINGE | Freq: Two times a day (BID) | INTRAMUSCULAR | 0 refills | Status: DC

## 2022-01-12 NOTE — Telephone Encounter (Signed)
Pt came into office upset wanting to be seen today.  His original appt is for tomorrow 01/13/22 at 3:15pm.  Advised pt we could see him, but he would be an add on and we would have to work him in.  Pt got upset and said he did not want to have to wait, since we were the one's who screwed up and sent in the wrong amount/prescription for his Lovenox.  Pt refused to wait to be seen, and just wanted [place] to send in another rx for Lovenox for him.   Upon further investigation a prescription for Lovenox 80mg /0.29ml syringes was sent to the pharmacy at pt's last anticoagulation appt with instructions to take 1.50ml (120mg ) once daily.  Amount 51ml (10 day supply).  Since pt had picked up 80mg  syringes, he started taking the 80mg  Lovenox twice daily as we had originally wanted to bridge him with but has ran out of Lovenox syringes.  A new rx for Lovenox 80mg  syringes with instructions to take Q12 hrs, 63mls/box of 10 sent into pt's pharmacy. Will check INR tomorrow 01/13/22 at scheduled INR check.

## 2022-01-13 ENCOUNTER — Other Ambulatory Visit: Payer: Self-pay

## 2022-01-13 ENCOUNTER — Ambulatory Visit (INDEPENDENT_AMBULATORY_CARE_PROVIDER_SITE_OTHER): Payer: Medicare Other | Admitting: *Deleted

## 2022-01-13 DIAGNOSIS — I359 Nonrheumatic aortic valve disorder, unspecified: Secondary | ICD-10-CM

## 2022-01-13 DIAGNOSIS — Z5181 Encounter for therapeutic drug level monitoring: Secondary | ICD-10-CM | POA: Diagnosis not present

## 2022-01-13 DIAGNOSIS — G459 Transient cerebral ischemic attack, unspecified: Secondary | ICD-10-CM

## 2022-01-13 LAB — POCT INR: INR: 1.5 — AB (ref 2.0–3.0)

## 2022-01-13 NOTE — Patient Instructions (Addendum)
Description   Continue Lovenox injections twice a day. Today and tomorrow take 2 tablets of Wafarin then continue taking 1 tablet daily except for 1.5 tablets on Thursdays. Recheck INR on Friday. [facility] for any changes in medications or upcoming procedures. [phone]

## 2022-01-16 ENCOUNTER — Encounter: Payer: Self-pay | Admitting: Gastroenterology

## 2022-01-16 ENCOUNTER — Other Ambulatory Visit: Payer: Self-pay

## 2022-01-16 ENCOUNTER — Ambulatory Visit (INDEPENDENT_AMBULATORY_CARE_PROVIDER_SITE_OTHER): Payer: Medicare Other | Admitting: *Deleted

## 2022-01-16 DIAGNOSIS — Z5181 Encounter for therapeutic drug level monitoring: Secondary | ICD-10-CM

## 2022-01-16 DIAGNOSIS — I359 Nonrheumatic aortic valve disorder, unspecified: Secondary | ICD-10-CM | POA: Diagnosis not present

## 2022-01-16 LAB — POCT INR: INR: 2.5 (ref 2.0–3.0)

## 2022-01-16 NOTE — Patient Instructions (Signed)
Description   -Stop lovenox -Continue taking 1 tablet daily except for 1.5 tablets on Thursdays. Recheck INR 2 weeks. Coumadin Clinic for any changes in medications or upcoming procedures. [phone]

## 2022-01-18 ENCOUNTER — Other Ambulatory Visit: Payer: Self-pay | Admitting: Gastroenterology

## 2022-01-27 ENCOUNTER — Other Ambulatory Visit: Payer: Self-pay

## 2022-01-27 ENCOUNTER — Ambulatory Visit (HOSPITAL_COMMUNITY)
Admission: RE | Admit: 2022-01-27 | Payer: Medicare Other | Source: Ambulatory Visit | Attending: Cardiovascular Disease | Admitting: Cardiovascular Disease

## 2022-01-27 ENCOUNTER — Ambulatory Visit (HOSPITAL_COMMUNITY)
Admission: RE | Admit: 2022-01-27 | Discharge: 2022-01-27 | Disposition: A | Discharge status: Home / Self Care | Payer: Medicare Other | Source: Ambulatory Visit | Attending: Cardiovascular Disease | Admitting: Cardiovascular Disease

## 2022-01-27 DIAGNOSIS — I739 Peripheral vascular disease, unspecified: Secondary | ICD-10-CM | POA: Insufficient documentation

## 2022-01-27 DIAGNOSIS — Z95828 Presence of other vascular implants and grafts: Secondary | ICD-10-CM | POA: Insufficient documentation

## 2022-01-27 DIAGNOSIS — Z9862 Peripheral vascular angioplasty status: Secondary | ICD-10-CM | POA: Diagnosis not present

## 2022-01-30 ENCOUNTER — Ambulatory Visit (INDEPENDENT_AMBULATORY_CARE_PROVIDER_SITE_OTHER): Payer: Medicare Other | Admitting: *Deleted

## 2022-01-30 ENCOUNTER — Other Ambulatory Visit: Payer: Self-pay

## 2022-01-30 ENCOUNTER — Telehealth (HOSPITAL_COMMUNITY): Payer: Self-pay | Admitting: Cardiology

## 2022-01-30 DIAGNOSIS — Z5181 Encounter for therapeutic drug level monitoring: Secondary | ICD-10-CM | POA: Diagnosis not present

## 2022-01-30 DIAGNOSIS — I359 Nonrheumatic aortic valve disorder, unspecified: Secondary | ICD-10-CM | POA: Diagnosis not present

## 2022-01-30 LAB — POCT INR: INR: 2.1 (ref 2.0–3.0)

## 2022-01-30 NOTE — Patient Instructions (Signed)
Description   -Take 1.5 tablets of warfarin today -Then continue taking 1 tablet daily except for 1.5 tablets on Thursdays. Recheck INR 2 weeks. Coumadin Clinic for any changes in medications or upcoming procedures. [phone]

## 2022-02-02 ENCOUNTER — Other Ambulatory Visit: Payer: Self-pay

## 2022-02-02 ENCOUNTER — Ambulatory Visit (INDEPENDENT_AMBULATORY_CARE_PROVIDER_SITE_OTHER): Payer: Medicare Other | Admitting: Podiatrist

## 2022-02-02 DIAGNOSIS — B351 Tinea unguium: Secondary | ICD-10-CM | POA: Diagnosis not present

## 2022-02-02 DIAGNOSIS — D689 Coagulation defect, unspecified: Secondary | ICD-10-CM

## 2022-02-03 ENCOUNTER — Ambulatory Visit: Payer: Medicare Other | Admitting: Podiatry

## 2022-02-04 ENCOUNTER — Encounter: Payer: Self-pay | Admitting: Podiatrist

## 2022-02-04 NOTE — Progress Notes (Signed)
Chief Complaint  Patient presents with   Nail Problem    Nail trim      HPI: Patient is 76 y.o. male who presents today for routine foot and nail care.  The patient denies any change in past medical history medications or allergies.  He is on an anticoagulant.  He relates pain in shoe gear when the nails become long.   No Known Allergies  Review of systems is negative except as noted in the HPI.  Denies nausea/ vomiting/ fevers/ chills or night sweats.   Denies difficulty breathing, denies calf pain or tenderness  Physical Exam  Patient is awake, alert, and oriented x 3.  In no acute distress.    Vascular status is intact with palpable pedal pulses DP and PT bilateral and capillary refill time less than 3 seconds bilateral.  No edema or erythema noted.   Neurological exam reveals epicritic and protective sensation grossly intact bilateral.   Dermatological exam reveals skin is supple and dry to bilateral feet.  No open lesions present.  Digital nails are thick, discolored, dystrophic, brittle with subungual debris present and clinically mycotic x 10.     Musculoskeletal exam: Musculature intact with dorsiflexion, plantarflexion, inversion, eversion. Ankle and First MPJ joint range of motion normal.    Assessment:   ICD-10-CM   1. Onychomycosis  B35.1     2. Coagulation defect ([place])  D68.9        Plan: Patient's nails were debrided without complication via sterile nail nipper and power bur.  The patient tolerated this well.  He will be seen back for periodic professional debridement in 3 to 4 months or as needed.

## 2022-02-05 ENCOUNTER — Ambulatory Visit: Payer: Medicare Other | Admitting: Gastroenterology

## 2022-02-06 ENCOUNTER — Other Ambulatory Visit: Payer: Self-pay | Admitting: Physician Assistant

## 2022-02-13 ENCOUNTER — Ambulatory Visit (INDEPENDENT_AMBULATORY_CARE_PROVIDER_SITE_OTHER): Payer: Medicare Other

## 2022-02-13 ENCOUNTER — Other Ambulatory Visit: Payer: Self-pay

## 2022-02-13 DIAGNOSIS — I359 Nonrheumatic aortic valve disorder, unspecified: Secondary | ICD-10-CM

## 2022-02-13 DIAGNOSIS — G459 Transient cerebral ischemic attack, unspecified: Secondary | ICD-10-CM

## 2022-02-13 DIAGNOSIS — Z5181 Encounter for therapeutic drug level monitoring: Secondary | ICD-10-CM

## 2022-02-13 LAB — POCT INR: INR: 1.5 — AB (ref 2.0–3.0)

## 2022-02-13 NOTE — Patient Instructions (Signed)
Description   ?Take 1.5 tablets today, then start taking 1 tablet daily except for 1.5 tablets on Mondays and Thursdays. Recheck INR 2 weeks. Coumadin Clinic for any changes in medications or upcoming procedures. [phone] ? ?  ?  ?

## 2022-02-26 ENCOUNTER — Other Ambulatory Visit (HOSPITAL_COMMUNITY): Payer: Self-pay | Admitting: Cardiovascular Disease

## 2022-02-26 DIAGNOSIS — I739 Peripheral vascular disease, unspecified: Secondary | ICD-10-CM

## 2022-02-27 ENCOUNTER — Other Ambulatory Visit: Payer: Self-pay

## 2022-02-27 ENCOUNTER — Ambulatory Visit (INDEPENDENT_AMBULATORY_CARE_PROVIDER_SITE_OTHER): Payer: Medicare Other

## 2022-02-27 DIAGNOSIS — I359 Nonrheumatic aortic valve disorder, unspecified: Secondary | ICD-10-CM | POA: Diagnosis not present

## 2022-02-27 DIAGNOSIS — Z5181 Encounter for therapeutic drug level monitoring: Secondary | ICD-10-CM

## 2022-02-27 LAB — POCT INR: INR: 2.6 (ref 2.0–3.0)

## 2022-02-27 NOTE — Patient Instructions (Signed)
Description   ?Continue taking 1 tablet daily except for 1.5 tablets on Mondays and Thursdays. Recheck INR 3 weeks. Coumadin Clinic for any changes in medications or upcoming procedures. [phone] ? ?  ?   ?

## 2022-03-02 NOTE — Progress Notes (Signed)
PCP: Shon Baton, MD ?HF Cardiology: Dr. Aundra Dubin ? ?[name] [name] is a 76 y.o. male with a history of CAD s/p CABG, mechanical AVR on coumadin, extensive PAD, DM, HTN, HLD, ITP, TIA, and recurrent GI bleed. Note, requires lovenox bridging off heparin.  ? ?Dr. Gwenlyn Found follows him for extensive PAD. He has had an aortobifem bypass in 2016 with follow up iliac stenting and femoral endarterectomy. In 2017, he had subsequent ostial and mid right SFA intervention. Hx of left renal artery stenting in 2016 with repeat intervention on left for 75% in-stent restenosis, with progression of disease on the right renal artery showing 60% stenosis on duplex 07/12/19. Echo 09/17/19 with normal EF of 60-65% and normal AVR function; chronic diastolic heart failure.  ?  ?Admitted in 10/20 with chest pain. He underwent heart cath 09/18/19 that showed 99% left main stenosis and patent LIMA to occluded LAD and a patent sequential vein to the PDA and PLA of an occluded dominant right. His left main stenosis jeopardized the moderately large ramus and high first marginal and nondominant Cx which has 90% mid AV groove Cx stenosis. Aggressive medical therapy was recommended initially. ? ?He was readmitted in 10/20 with CHF and chest pain. Hospital course complicated by A flutter/low output heart failure.  S/P successful orbital atherectomy, PTCA, and stenting of protected left main with a 4.0x15 mm resolute onyx DES 11/3. LIMA-LAD patent. SVG-PDA patent. ECHO this admission showed EF 30-35%, mechanical AoV ok. TEE 11/9 w/ improved EF, 40-45%. Placed on milrinone initially with low co-ox and later weaned off.  He was cardioverted from atrial flutter back to NSR.  ? ?2/21 peripheral arterial dopplers showed 75-99% stenosis right SFA.  In 3/21, he had stenting to right SFA.   ? ?Echo in 6/21 showed EF up to 55-60%.   ? ?In 12/21, he had an atrial fibrillation ablation.  ? ?He was admitted in 1/22 with shortness of breath and was found to be  in atrial fibrillation with RVR. He was hypertensive.  Troponin was negative. He was diuresed and discharged, cardiology was not notified.  Echo during this admission showed EF 60-65%, normal RV size and systolic function, stable mechanical aortic valve.  ? ?During the 1/22 admission, CT chest showed a lung nodule.  Followup PET-CT suggestive of lung cancer, biopsy with squamous cell lung cancer.  He underwent radiation therapy, now completed.  ? ?DCCV to NSR in 1/22.  ? ?Today he returns for HF follow up. Overall feeling fine. He exercises at the [facility] and plays golf 2-3 times a week and does not have significant exertional dyspnea with this. Denies palpitations, CP, dizziness, edema, or PND/Orthopnea. Appetite ok. No fever or chills. Weight at home 166 pounds. Taking all medications.  ? ?ECG (personally reviewed): NSR, 1st degree AVB PR 242 msec  ? ?Labs (11/20): K 4.3, creatinine 1.3, hgb 12.5 ?Labs (12/20): LDL 56, HDL 80, TGs 179 ?Labs (1/21): K 4.8, creatinine 1.45 ?Labs (4/21): K 5.2, creatinine 1.34 ?Labs (6/21): LDL 44 ?Labs (1/22): K 4, creatinine 1.3 ?Labs (3/22): K 4.4, creatinine 1.02 ? ?PMH  ?1. CAD:  S/P CABG (3/04) with LIMA-LAD, seq SVG-PDA/PLV.  ?- NSTEMI 10/20 with cath showing patent LIMA-LAD and SVG-PDA/PLV but 99% LM stenosis, occluded LAD, moderate ramus and high OM1 jeopardized, also 90% mid AV groove LCx stenosis.  Occluded RCA.  Patient ultimately had orbital atherectomy and stenting of left main with a 4.0 x 15 mm Resolute Onyx DES.  ?2. Mechanical aortic valve: TEE  11/20 showed stable-appearing mechanical valve, mean gradient 9 mmHg.  ?3. PAD: Aortobifemoral bypass in 2016 with follow up iliac stenting and femoral endarterectomy. In 2017, he had subsequent ostial and mid right SFA intervention. ?- Angiography 10/20 with 80% in-stent restenosis in proximal right SFA.  ?- Peripheral arterial dopplers (2/21) with 75-99% R SFA stenosis in prior stented area.  ?- 3/21 Right SFA stent (Dr.  Gwenlyn Found).  ?- ABIs (1/22): Stable mild left SFA disease.  ?4. Type 2 diabetes.  ?5. HTN ?- Renal artery dopplers (8/21): No significant stenosis.  ?6. Hyperlipidemia ?7. H/o ITP  ?8. TIA  ?9. Chronic Systolic HF: Ischemic cardiomyopathy.  ?- Echo (10/20): EF 30-35%, mechanical AoV ok.  ?- TEE (11/20): w/ improved EF, 40-45%. Mechanical aortic valve with mean gradient 9 mmHg, normal RV.  ?- Echo (6/21): EF 55-60%, mild LVH, mechanical AoV with mean gradient 9 mmHg.  ?- Echo (1/22): EF 60-65%, normal RV size and systolic function, stable mechanical aortic valve.  ?10. Atrial fibrillation: Paroxysmal.  ?- Atrial fibrillation ablation 12/21.  ?11. Renal artery stenosis: Hx of left renal artery stenting in 2016 with repeat intervention on left for 75% in-stent restenosis, with progression of disease on the right renal artery showing 60% stenosis on duplex 07/12/19. ?12. Carotid stenosis: 4/21 carotid dopplers with right subclavian stenosis, 40-59% RICA stenosis.  ?- Carotid dopplers (4/22): 1-39% BICA stenosis.  ?13. Suspected lung cancer: PET scan 1/22 suggestive of early lung cancer.  ?  ?ROS: All systems negative except as listed in HPI, PMH and Problem List. ? ?Social History  ? ?Socioeconomic History  ? Marital status: Divorced  ?  Spouse name: Not on file  ? Number of children: 3  ? Years of education: Not on file  ? Highest education level: Not on file  ?Occupational History  ? Occupation: Retired  ?Tobacco Use  ? Smoking status: Former  ?  Years: 30.00  ?  Types: Cigarettes, Cigars  ?  Quit date: 12/15/1993  ?  Years since quitting: 28.2  ? Smokeless tobacco: Never  ? Tobacco comments:  ?  occasional cigar  ?Vaping Use  ? Vaping Use: Never used  ?Substance and Sexual Activity  ? Alcohol use: Yes  ?  Alcohol/week: 16.0 standard drinks  ?  Types: 1 Cans of beer, 1 Shots of liquor, 14 Standard drinks or equivalent per week  ?  Comment: drinks 2 martini's a night (2 shots in each)  ? Drug use: No  ? Sexual activity: Yes   ?Other Topics Concern  ? Not on file  ?Social History Narrative  ? Tries to remain active.  Frequent golfer but claudication limits this.  ? ?Social Determinants of Health  ? ?Financial Resource Strain: Not on file  ?Food Insecurity: Not on file  ?Transportation Needs: Not on file  ?Physical Activity: Not on file  ?Stress: Not on file  ?Social Connections: Not on file  ?Intimate Partner Violence: Not on file  ? ?Family History  ?Problem Relation Age of Onset  ? Coronary artery disease Mother   ?     bypass surgery - deceased  ? Heart disease Father   ?     murmur, valve replacement - deceased  ? Breast cancer Sister   ? Diabetes Other   ?     grandmother  ? Diabetes Paternal Grandmother   ? Diabetes Paternal Aunt   ? Colon cancer Neg Hx   ? Colon polyps Neg Hx   ? Esophageal cancer Neg  Hx   ? Rectal cancer Neg Hx   ? Stomach cancer Neg Hx   ? ?Current Outpatient Medications  ?Medication Sig Dispense Refill  ? amoxicillin (AMOXIL) 500 MG capsule Take 2,000 mg by mouth See admin instructions. Take 2000 mg by mouth one hour prior to dental procedures    ? atorvastatin (LIPITOR) 80 MG tablet Take 80 mg by mouth daily.    ? ENTRESTO 24-26 MG TAKE 1 TABLET BY MOUTH TWICE A DAY 60 tablet 6  ? Evolocumab 140 MG/ML SOAJ Inject 140 mg into the skin every 14 (fourteen) days.    ? furosemide (LASIX) 20 MG tablet TAKE 2 TABLETS (40 MG TOTAL) BY MOUTH DAILY. 180 tablet 1  ? glyBURIDE (DIABETA) 2.5 MG tablet Take 5 mg by mouth 2 (two) times daily with a meal.    ? INVOKANA 100 MG TABS tablet Take 100 mg by mouth daily.    ? nitroGLYCERIN (NITROSTAT) 0.4 MG SL tablet Place 1 tablet (0.4 mg total) under the tongue every 5 (five) minutes x 3 doses as needed for chest pain. 25 tablet 12  ? pantoprazole (PROTONIX) 40 MG tablet TAKE 1 TABLET BY MOUTH EVERY DAY 90 tablet 3  ? sildenafil (VIAGRA) 100 MG tablet Take 100 mg by mouth as needed.    ? spironolactone (ALDACTONE) 25 MG tablet TAKE 1 TABLET BY MOUTH EVERY DAY 90 tablet 3  ?  warfarin (COUMADIN) 5 MG tablet TAKE 1 TO 1 AND 1/2 TABLETS BY MOUTH DAILY AS PRESCRIBED BY THE COUMADIN CLINIC. 120 tablet 1  ? Blood Glucose Monitoring Suppl (ONETOUCH VERIO IQ SYSTEM) w/Device KIT Check blood

## 2022-03-03 ENCOUNTER — Encounter (HOSPITAL_COMMUNITY): Payer: Self-pay

## 2022-03-03 ENCOUNTER — Encounter (HOSPITAL_COMMUNITY): Payer: Medicare Other | Admitting: Cardiology

## 2022-03-03 ENCOUNTER — Encounter (HOSPITAL_COMMUNITY): Payer: Self-pay | Admitting: Cardiology

## 2022-03-03 ENCOUNTER — Ambulatory Visit (HOSPITAL_COMMUNITY)
Admission: RE | Admit: 2022-03-03 | Discharge: 2022-03-03 | Disposition: A | Discharge status: Home / Self Care | Payer: Medicare Other | Source: Ambulatory Visit | Attending: Family Medicine | Admitting: Family Medicine

## 2022-03-03 ENCOUNTER — Other Ambulatory Visit: Payer: Self-pay

## 2022-03-03 VITALS — BP 100/64 | HR 71 | Wt 171.0 lb

## 2022-03-03 DIAGNOSIS — I252 Old myocardial infarction: Secondary | ICD-10-CM | POA: Diagnosis not present

## 2022-03-03 DIAGNOSIS — Z125 Encounter for screening for malignant neoplasm of prostate: Secondary | ICD-10-CM | POA: Diagnosis not present

## 2022-03-03 DIAGNOSIS — I480 Paroxysmal atrial fibrillation: Secondary | ICD-10-CM | POA: Insufficient documentation

## 2022-03-03 DIAGNOSIS — Z8249 Family history of ischemic heart disease and other diseases of the circulatory system: Secondary | ICD-10-CM | POA: Insufficient documentation

## 2022-03-03 DIAGNOSIS — Z833 Family history of diabetes mellitus: Secondary | ICD-10-CM | POA: Diagnosis not present

## 2022-03-03 DIAGNOSIS — Z951 Presence of aortocoronary bypass graft: Secondary | ICD-10-CM | POA: Diagnosis not present

## 2022-03-03 DIAGNOSIS — Z7901 Long term (current) use of anticoagulants: Secondary | ICD-10-CM | POA: Diagnosis not present

## 2022-03-03 DIAGNOSIS — Z8719 Personal history of other diseases of the digestive system: Secondary | ICD-10-CM

## 2022-03-03 DIAGNOSIS — I472 Ventricular tachycardia, unspecified: Secondary | ICD-10-CM | POA: Diagnosis not present

## 2022-03-03 DIAGNOSIS — Z23 Encounter for immunization: Secondary | ICD-10-CM | POA: Diagnosis not present

## 2022-03-03 DIAGNOSIS — I4729 Other ventricular tachycardia: Secondary | ICD-10-CM

## 2022-03-03 DIAGNOSIS — Z955 Presence of coronary angioplasty implant and graft: Secondary | ICD-10-CM | POA: Diagnosis not present

## 2022-03-03 DIAGNOSIS — I1310 Hypertensive heart and chronic kidney disease without heart failure, with stage 1 through stage 4 chronic kidney disease, or unspecified chronic kidney disease: Secondary | ICD-10-CM | POA: Diagnosis not present

## 2022-03-03 DIAGNOSIS — I25810 Atherosclerosis of coronary artery bypass graft(s) without angina pectoris: Secondary | ICD-10-CM

## 2022-03-03 DIAGNOSIS — E785 Hyperlipidemia, unspecified: Secondary | ICD-10-CM | POA: Insufficient documentation

## 2022-03-03 DIAGNOSIS — N1831 Chronic kidney disease, stage 3a: Secondary | ICD-10-CM | POA: Diagnosis not present

## 2022-03-03 DIAGNOSIS — Z8673 Personal history of transient ischemic attack (TIA), and cerebral infarction without residual deficits: Secondary | ICD-10-CM | POA: Insufficient documentation

## 2022-03-03 DIAGNOSIS — I6529 Occlusion and stenosis of unspecified carotid artery: Secondary | ICD-10-CM

## 2022-03-03 DIAGNOSIS — I739 Peripheral vascular disease, unspecified: Secondary | ICD-10-CM | POA: Diagnosis not present

## 2022-03-03 DIAGNOSIS — E1151 Type 2 diabetes mellitus with diabetic peripheral angiopathy without gangrene: Secondary | ICD-10-CM | POA: Insufficient documentation

## 2022-03-03 DIAGNOSIS — I5042 Chronic combined systolic (congestive) and diastolic (congestive) heart failure: Secondary | ICD-10-CM | POA: Insufficient documentation

## 2022-03-03 DIAGNOSIS — E1122 Type 2 diabetes mellitus with diabetic chronic kidney disease: Secondary | ICD-10-CM | POA: Diagnosis not present

## 2022-03-03 DIAGNOSIS — I2510 Atherosclerotic heart disease of native coronary artery without angina pectoris: Secondary | ICD-10-CM | POA: Diagnosis not present

## 2022-03-03 DIAGNOSIS — I255 Ischemic cardiomyopathy: Secondary | ICD-10-CM | POA: Insufficient documentation

## 2022-03-03 DIAGNOSIS — Z79899 Other long term (current) drug therapy: Secondary | ICD-10-CM | POA: Insufficient documentation

## 2022-03-03 DIAGNOSIS — I5032 Chronic diastolic (congestive) heart failure: Secondary | ICD-10-CM | POA: Diagnosis not present

## 2022-03-03 DIAGNOSIS — I700 Atherosclerosis of aorta: Secondary | ICD-10-CM | POA: Diagnosis not present

## 2022-03-03 DIAGNOSIS — E1159 Type 2 diabetes mellitus with other circulatory complications: Secondary | ICD-10-CM

## 2022-03-03 DIAGNOSIS — Z952 Presence of prosthetic heart valve: Secondary | ICD-10-CM | POA: Diagnosis not present

## 2022-03-03 DIAGNOSIS — Z87891 Personal history of nicotine dependence: Secondary | ICD-10-CM | POA: Insufficient documentation

## 2022-03-03 DIAGNOSIS — R972 Elevated prostate specific antigen [PSA]: Secondary | ICD-10-CM | POA: Diagnosis not present

## 2022-03-03 DIAGNOSIS — Z7984 Long term (current) use of oral hypoglycemic drugs: Secondary | ICD-10-CM | POA: Diagnosis not present

## 2022-03-03 DIAGNOSIS — I110 Hypertensive heart disease with heart failure: Secondary | ICD-10-CM | POA: Insufficient documentation

## 2022-03-03 DIAGNOSIS — Z1389 Encounter for screening for other disorder: Secondary | ICD-10-CM | POA: Diagnosis not present

## 2022-03-03 DIAGNOSIS — Z0000 Encounter for general adult medical examination without abnormal findings: Secondary | ICD-10-CM | POA: Diagnosis not present

## 2022-03-03 DIAGNOSIS — R82998 Other abnormal findings in urine: Secondary | ICD-10-CM | POA: Diagnosis not present

## 2022-03-03 LAB — BASIC METABOLIC PANEL
Anion gap: 7 (ref 5–15)
BUN: 26 mg/dL — ABNORMAL HIGH (ref 8–23)
CO2: 29 mmol/L (ref 22–32)
Calcium: 9.3 mg/dL (ref 8.9–10.3)
Chloride: 102 mmol/L (ref 98–111)
Creatinine, Ser: 1.38 mg/dL — ABNORMAL HIGH (ref 0.61–1.24)
GFR, Estimated: 53 mL/min — ABNORMAL LOW (ref >60)
Glucose, Bld: 213 mg/dL — ABNORMAL HIGH (ref 70–99)
Potassium: 4.4 mmol/L (ref 3.5–5.1)
Sodium: 138 mmol/L (ref 135–145)

## 2022-03-03 NOTE — Patient Instructions (Signed)
STOP Amiodarone ? ?Labs today ?We will only contact you if something comes back abnormal or we need to make some changes. ?Otherwise no news is good news!'' ? ?Your physician wants you to follow-up in: 6 months with Dr Kendall Flack will receive a reminder letter in the mail two months in advance. If you don't receive a letter, please call our office to schedule the follow-up appointment. ? ? ? ?Do the following things EVERYDAY: ?Weigh yourself in the morning before breakfast. Write it down and keep it in a log. ?Take your medicines as prescribed ?Eat low salt foods--Limit salt (sodium) to 2000 mg per day.  ?Stay as active as you can everyday ?Limit all fluids for the day to less than 2 liters ? ?At the [facility], you and your health needs are our priority. As part of our continuing mission to provide you with exceptional heart care, we have created designated Provider Care Teams. These Care Teams include your primary Cardiologist (physician) and Advanced Practice Providers (APPs- Physician Assistants and Nurse Practitioners) who all work together to provide you with the care you need, when you need it.  ? ?You may see any of the following providers on your designated Care Team at your next follow up: ?Dr Glori Bickers ?Dr Loralie Champagne ?Darrick Grinder, NP ?Lyda Jester, PA ?Jessica Milford,NP ?Marlyce Huge, PA ?Audry Riles, PharmD ? ? ?Please be sure to bring in all your medications bottles to every appointment.  ? ?If you have any questions or concerns before your next appointment please send [place] a message through [place] or call our office at [phone].   ? ?TO LEAVE A MESSAGE FOR THE NURSE SELECT OPTION 2, PLEASE LEAVE A MESSAGE INCLUDING: ?YOUR NAME ?DATE OF BIRTH ?CALL BACK NUMBER ?REASON FOR CALL**this is important as we prioritize the call backs ? ?YOU WILL RECEIVE A CALL BACK THE SAME DAY AS LONG AS YOU CALL BEFORE 4:00 PM ? ?

## 2022-03-20 ENCOUNTER — Ambulatory Visit (INDEPENDENT_AMBULATORY_CARE_PROVIDER_SITE_OTHER): Payer: Medicare Other

## 2022-03-20 DIAGNOSIS — I359 Nonrheumatic aortic valve disorder, unspecified: Secondary | ICD-10-CM | POA: Diagnosis not present

## 2022-03-20 DIAGNOSIS — Z5181 Encounter for therapeutic drug level monitoring: Secondary | ICD-10-CM | POA: Diagnosis not present

## 2022-03-20 LAB — POCT INR: INR: 1.7 — AB (ref 2.0–3.0)

## 2022-03-20 NOTE — Patient Instructions (Signed)
Description   ?Take another tablet today and then START taking 1 tablet daily except for 1.5 tablets on Sundays, Tuesdays and Thursdays.  ?Recheck INR 2 weeks. Coumadin Clinic for any changes in medications or upcoming procedures. [phone] ? ?  ?   ?

## 2022-04-03 ENCOUNTER — Ambulatory Visit (INDEPENDENT_AMBULATORY_CARE_PROVIDER_SITE_OTHER): Payer: Medicare Other

## 2022-04-03 DIAGNOSIS — I359 Nonrheumatic aortic valve disorder, unspecified: Secondary | ICD-10-CM

## 2022-04-03 DIAGNOSIS — Z5181 Encounter for therapeutic drug level monitoring: Secondary | ICD-10-CM

## 2022-04-03 LAB — POCT INR: INR: 2.2 (ref 2.0–3.0)

## 2022-04-03 NOTE — Patient Instructions (Signed)
Description   ?Take 1.5 tablets today and then continue taking 1 tablet daily except for 1.5 tablets on Sundays, Tuesdays and Thursdays.  ?Recheck INR 2 weeks.  ?Coumadin Clinic for any changes in medications or upcoming procedures. [phone] ? ?  ?   ?

## 2022-04-10 ENCOUNTER — Encounter (HOSPITAL_COMMUNITY): Payer: Medicare Other | Admitting: Cardiology

## 2022-04-16 NOTE — Patient Outreach (Signed)
Received a Health Coach referral notification for Mr. Stankus ?I have assigned Emelia Loron, RN to call for follow up and determine if there are any Case Management needs.  ?  ?Arville Care, CBCS, CMAA ?[place] Management Assistant ?[place] [facility] ?[phone]    ?

## 2022-04-17 ENCOUNTER — Ambulatory Visit (INDEPENDENT_AMBULATORY_CARE_PROVIDER_SITE_OTHER): Payer: Medicare Other | Admitting: *Deleted

## 2022-04-17 DIAGNOSIS — I359 Nonrheumatic aortic valve disorder, unspecified: Secondary | ICD-10-CM | POA: Diagnosis not present

## 2022-04-17 DIAGNOSIS — Z5181 Encounter for therapeutic drug level monitoring: Secondary | ICD-10-CM

## 2022-04-17 DIAGNOSIS — G459 Transient cerebral ischemic attack, unspecified: Secondary | ICD-10-CM

## 2022-04-17 LAB — POCT INR: INR: 2.6 (ref 2.0–3.0)

## 2022-04-17 NOTE — Patient Instructions (Signed)
Description   ?Continue taking 1 tablet daily except for 1.5 tablets on Sundays, Tuesdays and Thursdays.  ?Recheck INR 4 weeks. Coumadin Clinic for any changes in medications or upcoming procedures. [phone] ? ?  ?  ?

## 2022-04-28 DIAGNOSIS — L570 Actinic keratosis: Secondary | ICD-10-CM | POA: Diagnosis not present

## 2022-04-28 DIAGNOSIS — Z85828 Personal history of other malignant neoplasm of skin: Secondary | ICD-10-CM | POA: Diagnosis not present

## 2022-04-28 DIAGNOSIS — D692 Other nonthrombocytopenic purpura: Secondary | ICD-10-CM | POA: Diagnosis not present

## 2022-04-28 DIAGNOSIS — D1801 Hemangioma of skin and subcutaneous tissue: Secondary | ICD-10-CM | POA: Diagnosis not present

## 2022-04-28 DIAGNOSIS — L821 Other seborrheic keratosis: Secondary | ICD-10-CM | POA: Diagnosis not present

## 2022-04-28 DIAGNOSIS — C44629 Squamous cell carcinoma of skin of left upper limb, including shoulder: Secondary | ICD-10-CM | POA: Diagnosis not present

## 2022-04-30 ENCOUNTER — Other Ambulatory Visit: Payer: Self-pay | Admitting: *Deleted

## 2022-04-30 NOTE — Patient Outreach (Signed)
[facility]) Care Management  04/30/2022  Pericles Carmicheal 25-Jan-1946 [id number]  Unsuccessful outreach attempt made to patient. RN Health Coach left HIPAA compliant voicemail message along with her contact information.  Plan: RN Health Coach will call patient within the next several business days and will send an unsuccessful letter.  Emelia Loron RN, BSN [place] [phone] [email or web address]

## 2022-05-06 ENCOUNTER — Other Ambulatory Visit: Payer: Self-pay | Admitting: Adult Health

## 2022-05-06 ENCOUNTER — Encounter (HOSPITAL_BASED_OUTPATIENT_CLINIC_OR_DEPARTMENT_OTHER): Payer: Self-pay

## 2022-05-06 ENCOUNTER — Other Ambulatory Visit (HOSPITAL_COMMUNITY): Payer: Self-pay | Admitting: Adult Health

## 2022-05-06 ENCOUNTER — Other Ambulatory Visit (HOSPITAL_BASED_OUTPATIENT_CLINIC_OR_DEPARTMENT_OTHER): Payer: Self-pay | Admitting: Adult Health

## 2022-05-06 ENCOUNTER — Ambulatory Visit (HOSPITAL_BASED_OUTPATIENT_CLINIC_OR_DEPARTMENT_OTHER)
Admission: RE | Admit: 2022-05-06 | Discharge: 2022-05-06 | Disposition: A | Discharge status: Home / Self Care | Payer: Medicare Other | Source: Ambulatory Visit | Attending: Adult Health | Admitting: Adult Health

## 2022-05-06 DIAGNOSIS — I739 Peripheral vascular disease, unspecified: Secondary | ICD-10-CM | POA: Diagnosis not present

## 2022-05-06 DIAGNOSIS — R2689 Other abnormalities of gait and mobility: Secondary | ICD-10-CM | POA: Insufficient documentation

## 2022-05-06 DIAGNOSIS — G459 Transient cerebral ischemic attack, unspecified: Secondary | ICD-10-CM | POA: Diagnosis not present

## 2022-05-06 DIAGNOSIS — I639 Cerebral infarction, unspecified: Secondary | ICD-10-CM | POA: Diagnosis not present

## 2022-05-06 DIAGNOSIS — R4189 Other symptoms and signs involving cognitive functions and awareness: Secondary | ICD-10-CM | POA: Diagnosis not present

## 2022-05-06 DIAGNOSIS — J449 Chronic obstructive pulmonary disease, unspecified: Secondary | ICD-10-CM | POA: Diagnosis not present

## 2022-05-06 DIAGNOSIS — I5032 Chronic diastolic (congestive) heart failure: Secondary | ICD-10-CM | POA: Diagnosis not present

## 2022-05-06 DIAGNOSIS — I4819 Other persistent atrial fibrillation: Secondary | ICD-10-CM | POA: Diagnosis not present

## 2022-05-06 DIAGNOSIS — Z7901 Long term (current) use of anticoagulants: Secondary | ICD-10-CM | POA: Diagnosis not present

## 2022-05-06 DIAGNOSIS — R200 Anesthesia of skin: Secondary | ICD-10-CM | POA: Diagnosis not present

## 2022-05-06 DIAGNOSIS — E1122 Type 2 diabetes mellitus with diabetic chronic kidney disease: Secondary | ICD-10-CM | POA: Diagnosis not present

## 2022-05-06 DIAGNOSIS — R519 Headache, unspecified: Secondary | ICD-10-CM | POA: Diagnosis not present

## 2022-05-06 NOTE — Progress Notes (Signed)
5:30pm patient left of their own accord. Pt was made aware that tech was waiting for [facility] to return call regarding holding patient at facility.  5:42pm [employer] on call physician was given results of CT Head. And advised of patient leaving of their own accord.

## 2022-05-07 ENCOUNTER — Ambulatory Visit
Admission: RE | Admit: 2022-05-07 | Discharge: 2022-05-07 | Disposition: A | Discharge status: Home / Self Care | Payer: Medicare Other | Source: Ambulatory Visit | Attending: Adult Health | Admitting: Adult Health

## 2022-05-07 ENCOUNTER — Other Ambulatory Visit: Payer: Self-pay | Admitting: Adult Health

## 2022-05-07 DIAGNOSIS — R4189 Other symptoms and signs involving cognitive functions and awareness: Secondary | ICD-10-CM

## 2022-05-07 DIAGNOSIS — R200 Anesthesia of skin: Secondary | ICD-10-CM

## 2022-05-07 DIAGNOSIS — R2689 Other abnormalities of gait and mobility: Secondary | ICD-10-CM

## 2022-05-07 DIAGNOSIS — I771 Stricture of artery: Secondary | ICD-10-CM | POA: Diagnosis not present

## 2022-05-07 DIAGNOSIS — I6523 Occlusion and stenosis of bilateral carotid arteries: Secondary | ICD-10-CM | POA: Diagnosis not present

## 2022-05-07 DIAGNOSIS — I6503 Occlusion and stenosis of bilateral vertebral arteries: Secondary | ICD-10-CM | POA: Diagnosis not present

## 2022-05-07 DIAGNOSIS — Z8673 Personal history of transient ischemic attack (TIA), and cerebral infarction without residual deficits: Secondary | ICD-10-CM | POA: Diagnosis not present

## 2022-05-07 MED ORDER — IOPAMIDOL (ISOVUE-370) INJECTION 76%
75.0000 mL | Freq: Once | INTRAVENOUS | Status: AC | PRN
  Administered 2022-05-07: 75 mL via INTRAVENOUS

## 2022-05-08 ENCOUNTER — Other Ambulatory Visit: Payer: Self-pay | Admitting: *Deleted

## 2022-05-08 NOTE — Patient Outreach (Signed)
[facility]) Care Management  05/08/2022  [name] [name] 10/20/1946 [id number]  Successful telephone outreach call to patient for screening. HIPAA identifiers obtained. Nurse reviewed [facility] program and services with patient. Patient stated that he did not feel like the services would benefit him at this time.   Plan: Nurse previously sent patient a [facility] brochure and an unsuccessful letter.  Emelia Loron RN, BSN [place] [phone] [email or web address]

## 2022-05-13 NOTE — Progress Notes (Signed)
  Office Visit    Patient Name: [name] [name] Date of Encounter: 05/14/2022  Primary Care Provider:  Russo, [name], MD Primary Cardiologist:  Peter Nishan, MD Primary Electrophysiologist: None  Chief Complaint    [name] [name] is a 75 y.o. male with PMH of CAD s/p CABG, mechanical AVR on coumadin, extensive PAD, DM, HTN, HLD, ITP, TIA, PAF, NSVT, and recurrent GI bleed.  Past Medical History    Past Medical History:  Diagnosis Date   Adenomatous colon polyp 09/1997   Anemia    Aortic stenosis    s/p st[facility] jud[facility] mechanical AVR - Chronic Coumadin   Blood transfusion    "related to ITP"   CHF (congestive heart failure) (HCC)    COPD (chronic obstructive pulmonary disease) (HCC)    patient denies this dx on 01/27/21   Coronary artery disease    s/p cabg x 3 11/2003: lima-lad, seq vg to rpda and rpl   Diverticulitis of colon    Dysrhythmia    a-fib   GERD (gastroesophageal reflux disease)    Heart murmur    History of radiation therapy 02/18/2021, 02/20/2021, 02/25/2021   SBRT to left lung     Dr James Kinard   Hyperlipidemia    Hypertension    ITP (idiopathic thrombocytopenic purpura)    Lung cancer (HCC)    last week   Peripheral arterial disease (HCC)    a. history of aortobifemoral bypass grafting by Dr. Todd early b. LE angiography 04/22/2015 patent aortobifem graft, DES to R SFA   Peripheral vascular disease (HCC)    s/p Left external Iliac Artery stenting and subsequent left femoral endarterectomy 02/2011- post op course complicated by wound infxn req I&D 03/2011   Renal artery stenosis, native, bilateral (HCC)    a. bilateral renal artery stenosis by recent duplex ultrasound b. L renal artery stent 02/2015, R renal artery patent on angiogram   Stroke (HCC) 2015   TIA (transient ischemic attack) ~ 2013   Type II diabetes mellitus (HCC)    Past Surgical History:  Procedure Laterality Date   ABDOMINAL AORTAGRAM N/A 12/16/2011   Procedure: ABDOMINAL AORTAGRAM;   Surgeon: Michael Cooper, MD;  Location: [facility]ATH [facility];  Service: Cardiovascular;  Laterality: N/A;   ABDOMINAL AORTOGRAM W/LOWER EXTREMITY Right 02/29/2020   Procedure: ABDOMINAL AORTOGRAM W/LOWER EXTREMITY;  Surgeon: Berry, Jonathan J, MD;  Location: [facility];  Service: Cardiovascular;  Laterality: Right;   ANGIOPLASTY / STENTING ILIAC     Left external Iliac Artery   AORTA - BILATERAL FEMORAL ARTERY BYPASS GRAFT  01/18/2012   Procedure: AORTA BIFEMORAL BYPASS GRAFT;  Surgeon: Todd Early, MD;  Location: [facility] OR;  Service: Vascular;  Laterality: N/A;   AORTIC VALVE REPLACEMENT  ~ 2004   ATRIAL FIBRILLATION ABLATION N/A 11/20/2020   Procedure: ATRIAL FIBRILLATION ABLATION;  Surgeon: Camnitz, Will Martin, MD;  Location: [facility];  Service: Cardiovascular;  Laterality: N/A;   BRONCHIAL BIOPSY  01/28/2021   Procedure: BRONCHIAL BIOPSIES;  Surgeon: Icard, Bradley L, DO;  Location: [facility];  Service: Pulmonary;;   BRONCHIAL BRUSHINGS  01/28/2021   Procedure: BRONCHIAL BRUSHINGS;  Surgeon: Icard, Bradley L, DO;  Location: [facility];  Service: Pulmonary;;   BRONCHIAL NEEDLE ASPIRATION BIOPSY  01/28/2021   Procedure: BRONCHIAL NEEDLE ASPIRATION BIOPSIES;  Surgeon: Icard, Bradley L, DO;  Location: [facility];  Service: Pulmonary;;   BRONCHIAL WASHINGS  01/28/2021   Procedure: BRONCHIAL WASHINGS;  Surgeon: Icard, Bradley L, DO;  Location:   [place] ENDOSCOPY;  Service: Pulmonary;;   CARDIAC CATHETERIZATION  11/2003   /pt report 10/01/2016   CARDIAC VALVE REPLACEMENT  11/2003   aortic   CARDIOVERSION N/A 10/16/2019   Procedure: CARDIOVERSION;  Surgeon: Larey Dresser, MD;  Location: [facility] ENDOSCOPY;  Service: Cardiovascular;  Laterality: N/A;   CARDIOVERSION N/A 01/01/2021   Procedure: CARDIOVERSION;  Surgeon: Larey Dresser, MD;  Location: [facility] ENDOSCOPY;  Service: Cardiovascular;  Laterality: N/A;   CATARACT EXTRACTION W/ INTRAOCULAR LENS  IMPLANT, BILATERAL Bilateral    COLONOSCOPY      COLONOSCOPY WITH PROPOFOL N/A 09/03/2019   Procedure: COLONOSCOPY WITH PROPOFOL;  Surgeon: Doran Stabler, MD;  Location: [facility] ENDOSCOPY;  Service: Gastroenterology;  Laterality: N/A;   CORONARY ARTERY BYPASS GRAFT  11/2003   Archie Endo 04/21/2011   CORONARY ATHERECTOMY N/A 10/10/2019   Procedure: CORONARY ATHERECTOMY;  Surgeon: Sherren Mocha, MD;  Location: [place] CV LAB;  Service: Cardiovascular;  Laterality: N/A;   CORONARY STENT INTERVENTION N/A 10/10/2019   Procedure: CORONARY STENT INTERVENTION;  Surgeon: Sherren Mocha, MD;  Location: [place] CV LAB;  Service: Cardiovascular;  Laterality: N/A;   CORONARY/GRAFT ANGIOGRAPHY N/A 09/18/2019   Procedure: CORONARY/GRAFT ANGIOGRAPHY;  Surgeon: Lorretta Harp, MD;  Location: [place] CV LAB;  Service: Cardiovascular;  Laterality: N/A;   ESOPHAGOGASTRODUODENOSCOPY (EGD) WITH PROPOFOL N/A 02/27/2019   Procedure: ESOPHAGOGASTRODUODENOSCOPY (EGD) WITH PROPOFOL;  Surgeon: Doran Stabler, MD;  Location: [place];  Service: Endoscopy;  Laterality: N/A;   FIDUCIAL MARKER PLACEMENT  01/28/2021   Procedure: FIDUCIAL MARKER PLACEMENT;  Surgeon: Garner Nash, DO;  Location: [place];  Service: Pulmonary;;   HEMOSTASIS CLIP PLACEMENT  09/03/2019   Procedure: HEMOSTASIS CLIP PLACEMENT;  Surgeon: Doran Stabler, MD;  Location: [facility];  Service: Gastroenterology;;   LOWER EXTREMITY ANGIOGRAM N/A 02/21/2015   Procedure: LOWER EXTREMITY ANGIOGRAM;  Surgeon: Lorretta Harp, MD;  Location: [facility] CATH LAB;  Service: Cardiovascular;  Laterality: N/A;   PERIPHERAL VASCULAR CATHETERIZATION N/A 04/22/2015   Procedure: Lower Extremity Angiography;  Surgeon: Lorretta Harp, MD;  Location: [place] CV LAB;  Service: Cardiovascular;  Laterality: N/A;   PERIPHERAL VASCULAR CATHETERIZATION N/A 08/24/2016   Procedure: Lower Extremity Angiography;  Surgeon: Lorretta Harp, MD;  Location: [place] CV LAB;  Service: Cardiovascular;   Laterality: N/A;   PERIPHERAL VASCULAR CATHETERIZATION Right 10/01/2016   Procedure: Peripheral Vascular Intervention - STENT;  Surgeon: Lorretta Harp, MD;  Location: [place] CV LAB;  Service: Cardiovascular;  Laterality: Right;  Prox and MID SFA    PERIPHERAL VASCULAR INTERVENTION  02/29/2020   Procedure: PERIPHERAL VASCULAR INTERVENTION;  Surgeon: Lorretta Harp, MD;  Location: [place] CV LAB;  Service: Cardiovascular;;  Right SFA   POLYPECTOMY     RENAL ANGIOGRAM N/A 02/21/2015   Procedure: RENAL ANGIOGRAM;  Surgeon: Lorretta Harp, MD;  Location: [facility] CATH LAB;  Service: Cardiovascular;  Laterality: Bilateral; 6 mm x 12 mm long Herculink balloon expandable stent to the left renal artery   RENAL ARTERY STENT Left 04/22/2015   dr berry   SPLENECTOMY  02/2003   Archie Endo 04/21/2011   TEE WITHOUT CARDIOVERSION N/A 10/16/2019   Procedure: TRANSESOPHAGEAL ECHOCARDIOGRAM (TEE);  Surgeon: Larey Dresser, MD;  Location: [facility] ENDOSCOPY;  Service: Cardiovascular;  Laterality: N/A;   TEMPORARY PACEMAKER N/A 10/10/2019   Procedure: TEMPORARY PACEMAKER;  Surgeon: Sherren Mocha, MD;  Location: [place] CV LAB;  Service: Cardiovascular;  Laterality: N/A;   TONSILLECTOMY  ~  1952   VIDEO BRONCHOSCOPY WITH ENDOBRONCHIAL NAVIGATION N/A 01/28/2021   Procedure: VIDEO BRONCHOSCOPY WITH ENDOBRONCHIAL NAVIGATION;  Surgeon: Garner Nash, DO;  Location: [place];  Service: Pulmonary;  Laterality: N/A;   Allergies  No Known Allergies  History of Present Illness    [name] [name] is a 76 year old with the above-mentioned past medical history who presents today for follow-up.  He is doing he has had an extensive PAD history and has been followed by Dr. Alvester Chou.  He underwent aortobifem bypass in 2016 with follow-up iliac stenting and endarterectomy in 2017.  Patient also underwent renal artery stenting in 2016 with repeat intervention in 2020.    2D echo completed in 2020 with normal EF of  60-65% and normal AVR function; chronic diastolic heart failure.  He underwent heart cath on 09/2019 that showed occluded LAD and patent LIMA, left main stenosis jeopardizing moderately large ramus and first marginal and nondominant circumflex which is 90% mid AV groove Cx stenosis.  He was readmitted on 09/2019 with CHF and chest pain during hospital course patient underwent orbital arthrectomy, PTCA and stenting of protected left main with DES x1.  Repeat TEE completed with EF, 40-45%. Placed on milrinone initially with low co-ox and later weaned off.  He was cardioverted from atrial flutter back to NSR.  In 2021 peripheral artery Dopplers revealed 75-99% stenosis of right SFA which was repaired with stenting.  2D echo on 05/2020 with EF increased to 55-60%.  He underwent atrial fibrillation ablation on 11/2020, and was admitted on 12/2020 with shortness of breath and found to be in AF with RVR.  2D echo completed during admission that showed EF of 60-65% normal RV size and systolic function with stable mechanical aortic valve.  CT completed during same admission showed lung nodule with follow-up PET CT suggestive of lung cancer.  He underwent radiation therapy is now completed.  He was recently seen in heart failure clinic on 03/03/2022.  He was overall feeling fine and denied any palpitations, CP, dizziness, edema or PND orthopnea. Amiodarone was discontinue and patient was in NSR.  He was seen by his PCP on 05/06/2022 for complaint of numbness of the right hand and pressure in the back of his head.  He was sent for CT of the head and neck that revealed carotid stenosis measuring 70% on the right internal carotid and 50% on the left internal carotid.  He was also noted to have severe stenosis of the vertebral arteries bilaterally.  He was advised to follow-up with peripheral artery Dr. For evaluation of new carotid findings.  Since last being seen in the office patient reports that he has had frequent headaches  in the back of his head and blurry vision in his left eye. He was sent for CT of the head and neck that revealed carotid stenosis measuring 70% on the right internal carotid and 50% on the left internal carotid.  These findings were inconsistent versus last year's findings that revealed mild carotid disease. I consulted the DOD to verify if patient required additional carotid ultrasound. DOD stated that CT results were more definitive than ultrasound. Patient states that these symptoms have developed in the last 2 months.  He is also having complaints of palpitations at rest.  EKG was obtained today with no atrial fibrillation and sinus rhythm with first-degree AV block.  He was euvolemic on examination today and reports compliance with his current medication regimen.  Patient denies chest pain, palpitations, dyspnea,  PND, orthopnea, nausea, vomiting, dizziness, syncope, edema, weight gain, or early satiety.  Home Medications    Current Outpatient Medications  Medication Sig Dispense Refill   amoxicillin (AMOXIL) 500 MG capsule Take 2,000 mg by mouth See admin instructions. Take 2000 mg by mouth one hour prior to dental procedures     atorvastatin (LIPITOR) 80 MG tablet Take 80 mg by mouth daily.     Blood Glucose Monitoring Suppl (ONETOUCH VERIO IQ SYSTEM) w/Device KIT      enoxaparin (LOVENOX) 80 MG/0.8ML injection Inject 0.8 mLs (80 mg total) into the skin every 12 (twelve) hours. As instructed by Coumadin Clinic 8 mL 0   ENTRESTO 24-26 MG TAKE 1 TABLET BY MOUTH TWICE A DAY 60 tablet 6   Evolocumab 140 MG/ML SOAJ Inject 140 mg into the skin every 14 (fourteen) days.     furosemide (LASIX) 20 MG tablet TAKE 2 TABLETS (40 MG TOTAL) BY MOUTH DAILY. 180 tablet 1   glucose blood (ONETOUCH VERIO) test strip      glyBURIDE (DIABETA) 2.5 MG tablet Take 5 mg by mouth 2 (two) times daily with a meal.     INVOKANA 100 MG TABS tablet Take 100 mg by mouth daily.     nitroGLYCERIN (NITROSTAT) 0.4 MG SL tablet  Place 1 tablet (0.4 mg total) under the tongue every 5 (five) minutes x 3 doses as needed for chest pain. 25 tablet 12   OneTouch Delica Lancets 33G MISC      pantoprazole (PROTONIX) 40 MG tablet TAKE 1 TABLET BY MOUTH EVERY DAY 90 tablet 3   sildenafil (VIAGRA) 100 MG tablet Take 100 mg by mouth as needed.     spironolactone (ALDACTONE) 25 MG tablet TAKE 1 TABLET BY MOUTH EVERY DAY 90 tablet 3   warfarin (COUMADIN) 5 MG tablet TAKE 1 TO 1 AND 1/2 TABLETS BY MOUTH DAILY AS PRESCRIBED BY THE COUMADIN CLINIC. 120 tablet 1   No current facility-administered medications for this visit.     Review of Systems  Please see the history of present illness.    (+) Headaches and back of the head (+) Blurry vision in the left eye  All other systems reviewed and are otherwise negative except as noted above.  Physical Exam    Wt Readings from Last 3 Encounters:  05/14/22 171 lb 9.6 oz (77.8 kg)  03/03/22 171 lb (77.6 kg)  01/08/22 174 lb 6 oz (79.1 kg)   VS: Vitals:   05/14/22 0908  BP: 128/68  Pulse: 76  SpO2: 94%  ,Body mass index is 26.09 kg/m.  Constitutional:      Appearance: Healthy appearance. Not in distress.  Neck:     Vascular: JVD normal.  Pulmonary:     Effort: Pulmonary effort is normal.     Breath sounds: No wheezing. No rales. Diminished in the bases Cardiovascular:     Normal rate. Regular rhythm. Normal S1.  Mechanical AV valve S2.  Bruit present right carotid no bruit present on the left carotid.   Murmurs: There is no murmur.  Edema:    Peripheral edema absent.  Abdominal:     Palpations: Abdomen is soft non tender. There is no hepatomegaly.  Skin:    General: Skin is warm and dry.  Neurological:     General: Blurry vision and left eye versus right    Mental Status: Alert and oriented to person, place and time.     Cranial Nerves: Cranial nerves are intact.  EKG/LABS/Other Studies   Reviewed    ECG personally reviewed by me today -sinus rhythm with first-degree  AV block heart rate of 72 with no acute changes  Risk Assessment/Calculations:    CHA2DS2-VASc Score = 8   This indicates a 10.8% annual risk of stroke. The patient's score is based upon: CHF History: 1 HTN History: 1 Diabetes History: 1 Stroke History: 2 Vascular Disease History: 1 Age Score: 2 Gender Score: 0           Lab Results  Component Value Date   WBC 7.3 12/25/2020   HGB 17.3 (H) 12/25/2020   HCT 53.9 (H) 12/25/2020   MCV 96.8 12/25/2020   PLT 222 12/25/2020   Lab Results  Component Value Date   CREATININE 1.38 (H) 03/03/2022   BUN 26 (H) 03/03/2022   NA 138 03/03/2022   K 4.4 03/03/2022   CL 102 03/03/2022   CO2 29 03/03/2022   Lab Results  Component Value Date   ALT 21 04/21/2021   AST 23 04/21/2021   ALKPHOS 62 04/21/2021   BILITOT 1.0 04/21/2021   Lab Results  Component Value Date   CHOL 144 04/21/2021   HDL 74 04/21/2021   LDLCALC 40 04/21/2021   TRIG 148 04/21/2021   CHOLHDL 1.9 04/21/2021    Lab Results  Component Value Date   HGBA1C 7.1 (H) 12/08/2020    Assessment & Plan    Carotid artery disease -Patient presents today with new onset complaint of blurry vision and headache for the last 2 months consistent with TIA symptoms. -DOD was consulted regarding new onset of symptoms and recommend patient be referred to vascular services for further evaluation. -Patient was also advised to follow-up with vision/retinal doctor with complaints.  2.Chronic Systolic/Diastolic CHF: -Last echo completed 12/2020 with EF of 60-65%. -Patient is euvolemic on examination today and denies any dietary indiscretions with salt. -Continue current GDMT with Entresto 24/26 mg daily Aldactone 25 mg daily, Invokana 100 mg daily  3. CAD: -s/p NSTEMI 09/2019 -No anginal complaints today -Continue GDMT with Lipitor 80 mg -Patient is not on ASA therapy due to Coumadin therapy.  4. PVD: -No complaints of claudication -Annual Dopplers and ABI performed  12/2020 with mild left SFA disease -Followed by Dr. Barry for PAD and PVD treatment.  5.  Mechanical AVR: -SBE prophylaxis -Continue Coumadin 5 mg daily   6. Paroxysmal A-fib: -s/p A-fib ablation 2021 and amiodarone discontinued 02/2022 -Patient sinus rhythm today per EKG -Continue Coumadin 5 mg   Disposition: Follow-up with advanced heart failure clinic in 6 months    Medication Adjustments/Labs and Tests Ordered: Current medicines are reviewed at length with the patient today.  Concerns regarding medicines are outlined above.   Signed, Dick Jr, Ernest Henry, NP 05/14/2022, 10:27 AM [facility] 

## 2022-05-14 ENCOUNTER — Ambulatory Visit (INDEPENDENT_AMBULATORY_CARE_PROVIDER_SITE_OTHER): Payer: Medicare Other | Admitting: *Deleted

## 2022-05-14 ENCOUNTER — Encounter: Payer: Self-pay | Admitting: Physician Assistant

## 2022-05-14 ENCOUNTER — Ambulatory Visit (INDEPENDENT_AMBULATORY_CARE_PROVIDER_SITE_OTHER): Payer: Medicare Other | Admitting: Nurse Practitioner

## 2022-05-14 VITALS — BP 128/68 | HR 76 | Ht 68.0 in | Wt 171.6 lb

## 2022-05-14 DIAGNOSIS — I739 Peripheral vascular disease, unspecified: Secondary | ICD-10-CM | POA: Diagnosis not present

## 2022-05-14 DIAGNOSIS — I6523 Occlusion and stenosis of bilateral carotid arteries: Secondary | ICD-10-CM

## 2022-05-14 DIAGNOSIS — I25810 Atherosclerosis of coronary artery bypass graft(s) without angina pectoris: Secondary | ICD-10-CM | POA: Diagnosis not present

## 2022-05-14 DIAGNOSIS — I5032 Chronic diastolic (congestive) heart failure: Secondary | ICD-10-CM

## 2022-05-14 DIAGNOSIS — I6529 Occlusion and stenosis of unspecified carotid artery: Secondary | ICD-10-CM | POA: Diagnosis not present

## 2022-05-14 DIAGNOSIS — Z5181 Encounter for therapeutic drug level monitoring: Secondary | ICD-10-CM

## 2022-05-14 DIAGNOSIS — I359 Nonrheumatic aortic valve disorder, unspecified: Secondary | ICD-10-CM

## 2022-05-14 DIAGNOSIS — I5022 Chronic systolic (congestive) heart failure: Secondary | ICD-10-CM

## 2022-05-14 DIAGNOSIS — I480 Paroxysmal atrial fibrillation: Secondary | ICD-10-CM

## 2022-05-14 DIAGNOSIS — G459 Transient cerebral ischemic attack, unspecified: Secondary | ICD-10-CM

## 2022-05-14 LAB — POCT INR: INR: 1.8 — AB (ref 2.0–3.0)

## 2022-05-14 NOTE — Patient Instructions (Signed)
Medication Instructions:  Your physician recommends that you continue on your current medications as directed. Please refer to the Current Medication list given to you today.  *If you need a refill on your cardiac medications before your next appointment, please call your pharmacy*  Lab Work: NONE ordered at this time of appointment   If you have labs (blood work) drawn today and your tests are completely normal, you will receive your results only by: [place] (if you have MyChart) OR A paper copy in the mail If you have any lab test that is abnormal or we need to change your treatment, we will call you to review the results.  Testing/Procedures: You have been referred to Vascular Surgery   Follow-Up: At [facility], you and your health needs are our priority.  As part of our continuing mission to provide you with exceptional heart care, we have created designated Provider Care Teams.  These Care Teams include your primary Cardiologist (physician) and Advanced Practice Providers (APPs -  Physician Assistants and Nurse Practitioners) who all work together to provide you with the care you need, when you need it.   Your next appointment:   6 month(s)  The format for your next appointment:   In Person  Provider:   Dr. Aundra Dubin      Other Instructions   Important Information About Sugar

## 2022-05-14 NOTE — Patient Instructions (Signed)
Description   Today take 2 tablets then start taking 1.5 tablets daily except for 1 tablet on Mondays, Wednesdays, and Fridays. Recheck INR 2 weeks. Coumadin Clinic for any changes in medications or upcoming procedures. [phone]

## 2022-05-17 ENCOUNTER — Other Ambulatory Visit (HOSPITAL_COMMUNITY): Payer: Self-pay | Admitting: Cardiology

## 2022-05-23 NOTE — Progress Notes (Unsigned)
[facility] [place]  ASSESSMENT / PLAN: [name] [name] is a 76 y.o. male with possibly symptomatic carotid artery stenosis.  I counseled the patient that some of his symptoms including clumsiness, slurred speech, and perioral numbness could be symptoms of TIA attributable to carotid artery lesion.  Other symptoms including headache, dizziness could not be ascribed to carotid artery stenosis.  He has not yet had neurologic evaluation, which I think he needs promptly.  Should his neurologist feel that he has symptomatic carotid artery stenosis, we will plan to pursue revascularization.  The patient should continue best medical therapy for carotid artery stenosis including: Complete cessation from all tobacco products. Blood glucose control with goal A1c < 7%. Blood pressure control with goal blood pressure < 140/90 mmHg. Lipid reduction therapy with goal LDL-C <100 mg/dL (<70 if symptomatic from carotid artery stenosis).  Aspirin 67m PO QD.  Atorvastatin 40-80mg PO QD (or other "high intensity" statin therapy).  I will see him after neurologic evaluation to discuss neck steps.  CHIEF COMPLAINT: Headache  HISTORY OF PRESENT ILLNESS: PElazar Argabrightis a 76y.o. male who presents to clinic for evaluation of carotid artery stenosis.  The patient was referred by his primary care physician.  The patient reports he has had several month history of intermittent, severe headache on the right side radiating from his cervical skull to his frontal bone.  He is also had episodic neurologic symptoms over that time.  He describes blurriness in his right eye, he describes occasional numbness especially noted around his mouth, he reports episodic clumsiness where he cannot perform fine motor tasks (such as starting his car).  He has a strong personal atherosclerotic history.  He suffered a stroke about 6 years ago.  VASCULAR SURGICAL HISTORY: Aortobifemoral bypass graft  01/18/2012 these Early)  VASCULAR RISK FACTORS: Positive history of stroke / transient ischemic attack. Positive history of coronary artery disease.  History of aortic valve replacement and three-vessel CABG with Dr. BCyndia Bent  Multiple cardiac catheterizations. Positive history of diabetes mellitus. Last A1c 7.1. Positive history of smoking. Not actively smoking. Positive history of hypertension.  Negative history of chronic kidney disease.  Last GFR 53.  Negative history of chronic obstructive pulmonary disease.  FUNCTIONAL STATUS: ECOG performance status: (1) Restricted in physically strenuous activity, ambulatory and able to do work of light nature Ambulatory status: Ambulatory within the community with limits  Past Medical History:  Diagnosis Date   Adenomatous colon polyp 09/1997   Anemia    Aortic stenosis    s/p [facility] mechanical AVR - Chronic Coumadin   Blood transfusion    "related to ITP"   CHF (congestive heart failure) (HCC)    COPD (chronic obstructive pulmonary disease) ([facility]    patient denies this dx on 01/27/21   Coronary artery disease    s/p cabg x 3 11/2003: lima-lad, seq vg to rpda and rpl   Diverticulitis of colon    Dysrhythmia    a-fib   GERD (gastroesophageal reflux disease)    Heart murmur    History of radiation therapy 02/18/2021, 02/20/2021, 02/25/2021   SBRT to left lung     Dr JGery Pray  Hyperlipidemia    Hypertension    ITP (idiopathic thrombocytopenic purpura)    Lung cancer (HCC)    last week   Peripheral arterial disease ([facility]    a. history of aortobifemoral bypass grafting by Dr. TSherren Mochaearly b. LE angiography 04/22/2015 patent aortobifem graft, DES  to R SFA   Peripheral vascular disease ([place])    s/p Left external Iliac Artery stenting and subsequent left femoral endarterectomy 02/2011- post op course complicated by wound infxn req I&D 03/2011   Renal artery stenosis, native, bilateral (HCC)    a. bilateral renal artery stenosis by recent  duplex ultrasound b. L renal artery stent 02/2015, R renal artery patent on angiogram   Stroke [facility]) 2015   TIA (transient ischemic attack) ~ 2013   Type II diabetes mellitus [facility])     Past Surgical History:  Procedure Laterality Date   ABDOMINAL AORTAGRAM N/A 12/16/2011   Procedure: ABDOMINAL Maxcine Ham;  Surgeon: Sherren Mocha, MD;  Location: [facility] CATH LAB;  Service: Cardiovascular;  Laterality: N/A;   ABDOMINAL AORTOGRAM W/LOWER EXTREMITY Right 02/29/2020   Procedure: ABDOMINAL AORTOGRAM W/LOWER EXTREMITY;  Surgeon: Lorretta Harp, MD;  Location: [place] CV LAB;  Service: Cardiovascular;  Laterality: Right;   ANGIOPLASTY / STENTING ILIAC     Left external Iliac Artery   AORTA - BILATERAL FEMORAL ARTERY BYPASS GRAFT  01/18/2012   Procedure: AORTA BIFEMORAL BYPASS GRAFT;  Surgeon: Curt Jews, MD;  Location: [place];  Service: Vascular;  Laterality: N/A;   AORTIC VALVE REPLACEMENT  ~ 2004   ATRIAL FIBRILLATION ABLATION N/A 11/20/2020   Procedure: ATRIAL FIBRILLATION ABLATION;  Surgeon: Constance Haw, MD;  Location: [place] CV LAB;  Service: Cardiovascular;  Laterality: N/A;   BRONCHIAL BIOPSY  01/28/2021   Procedure: BRONCHIAL BIOPSIES;  Surgeon: Garner Nash, DO;  Location: [place] ENDOSCOPY;  Service: Pulmonary;;   BRONCHIAL BRUSHINGS  01/28/2021   Procedure: BRONCHIAL BRUSHINGS;  Surgeon: Garner Nash, DO;  Location: [place];  Service: Pulmonary;;   BRONCHIAL NEEDLE ASPIRATION BIOPSY  01/28/2021   Procedure: BRONCHIAL NEEDLE ASPIRATION BIOPSIES;  Surgeon: Garner Nash, DO;  Location: [place] [facility];  Service: Pulmonary;;   BRONCHIAL WASHINGS  01/28/2021   Procedure: BRONCHIAL WASHINGS;  Surgeon: Garner Nash, DO;  Location: [place];  Service: Pulmonary;;   CARDIAC CATHETERIZATION  11/2003   /pt report 10/01/2016   CARDIAC VALVE REPLACEMENT  11/2003   aortic   CARDIOVERSION N/A 10/16/2019   Procedure: CARDIOVERSION;  Surgeon: Larey Dresser, MD;  Location:  [place];  Service: Cardiovascular;  Laterality: N/A;   CARDIOVERSION N/A 01/01/2021   Procedure: CARDIOVERSION;  Surgeon: Larey Dresser, MD;  Location: [place];  Service: Cardiovascular;  Laterality: N/A;   CATARACT EXTRACTION W/ INTRAOCULAR LENS  IMPLANT, BILATERAL Bilateral    COLONOSCOPY     COLONOSCOPY WITH PROPOFOL N/A 09/03/2019   Procedure: COLONOSCOPY WITH PROPOFOL;  Surgeon: Doran Stabler, MD;  Location: [facility] ENDOSCOPY;  Service: Gastroenterology;  Laterality: N/A;   CORONARY ARTERY BYPASS GRAFT  11/2003   Archie Endo 04/21/2011   CORONARY ATHERECTOMY N/A 10/10/2019   Procedure: CORONARY ATHERECTOMY;  Surgeon: Sherren Mocha, MD;  Location: [place] CV LAB;  Service: Cardiovascular;  Laterality: N/A;   CORONARY STENT INTERVENTION N/A 10/10/2019   Procedure: CORONARY STENT INTERVENTION;  Surgeon: Sherren Mocha, MD;  Location: [place] CV LAB;  Service: Cardiovascular;  Laterality: N/A;   CORONARY/GRAFT ANGIOGRAPHY N/A 09/18/2019   Procedure: CORONARY/GRAFT ANGIOGRAPHY;  Surgeon: Lorretta Harp, MD;  Location: [place] CV LAB;  Service: Cardiovascular;  Laterality: N/A;   ESOPHAGOGASTRODUODENOSCOPY (EGD) WITH PROPOFOL N/A 02/27/2019   Procedure: ESOPHAGOGASTRODUODENOSCOPY (EGD) WITH PROPOFOL;  Surgeon: Doran Stabler, MD;  Location: [place];  Service: Endoscopy;  Laterality: N/A;   FIDUCIAL MARKER PLACEMENT  01/28/2021   Procedure: FIDUCIAL MARKER PLACEMENT;  Surgeon: Garner Nash, DO;  Location: [place];  Service: Pulmonary;;   HEMOSTASIS CLIP PLACEMENT  09/03/2019   Procedure: HEMOSTASIS CLIP PLACEMENT;  Surgeon: Doran Stabler, MD;  Location: [facility];  Service: Gastroenterology;;   LOWER EXTREMITY ANGIOGRAM N/A 02/21/2015   Procedure: LOWER EXTREMITY ANGIOGRAM;  Surgeon: Lorretta Harp, MD;  Location: [facility] CATH LAB;  Service: Cardiovascular;  Laterality: N/A;   PERIPHERAL VASCULAR CATHETERIZATION N/A 04/22/2015   Procedure: Lower Extremity  Angiography;  Surgeon: Lorretta Harp, MD;  Location: [place] CV LAB;  Service: Cardiovascular;  Laterality: N/A;   PERIPHERAL VASCULAR CATHETERIZATION N/A 08/24/2016   Procedure: Lower Extremity Angiography;  Surgeon: Lorretta Harp, MD;  Location: [place] CV LAB;  Service: Cardiovascular;  Laterality: N/A;   PERIPHERAL VASCULAR CATHETERIZATION Right 10/01/2016   Procedure: Peripheral Vascular Intervention - STENT;  Surgeon: Lorretta Harp, MD;  Location: [place] CV LAB;  Service: Cardiovascular;  Laterality: Right;  Prox and MID SFA    PERIPHERAL VASCULAR INTERVENTION  02/29/2020   Procedure: PERIPHERAL VASCULAR INTERVENTION;  Surgeon: Lorretta Harp, MD;  Location: [place] CV LAB;  Service: Cardiovascular;;  Right SFA   POLYPECTOMY     RENAL ANGIOGRAM N/A 02/21/2015   Procedure: RENAL ANGIOGRAM;  Surgeon: Lorretta Harp, MD;  Location[facility] CATH LAB;  Service: Cardiovascular;  Laterality: Bilateral; 6 mm x 12 mm long Herculink balloon expandable stent to the left renal artery   RENAL ARTERY STENT Left 04/22/2015   dr berry   SPLENECTOMY  02/2003   Archie Endo 04/21/2011   TEE WITHOUT CARDIOVERSION N/A 10/16/2019   Procedure: TRANSESOPHAGEAL ECHOCARDIOGRAM (TEE);  Surgeon: Larey Dresser, MD;  [facility] ENDOSCOPY;  Service: Cardiovascular;  Laterality: N/A;   TEMPORARY PACEMAKER N/A 10/10/2019   Procedure: TEMPORARY PACEMAKER;  Surgeon: Sherren Mocha, MD;  Location: [place] CV LAB;  Service: Cardiovascular;  Laterality: N/A;   TONSILLECTOMY  ~ [place] N/A 01/28/2021   Procedure: VIDEO BRONCHOSCOPY WITH ENDOBRONCHIAL NAVIGATION;  Surgeon: Garner Nash, DO;  Location: [place];  Service: Pulmonary;  Laterality: N/A;    Family History  Problem Relation Age of Onset   Coronary artery disease Mother        bypass surgery - deceased   Heart disease Father        murmur, valve replacement - deceased   Breast  cancer Sister    Diabetes Other        grandmother   Diabetes Paternal Grandmother    Diabetes Paternal Aunt    Colon cancer Neg Hx    Colon polyps Neg Hx    Esophageal cancer Neg Hx    Rectal cancer Neg Hx    Stomach cancer Neg Hx     Social History   Socioeconomic History   Marital status: Divorced    Spouse name: Not on file   Number of children: 3   Years of education: Not on file   Highest education level: Not on file  Occupational History   Occupation: Retired  Tobacco Use   Smoking status: Former    Years: 30.00    Types: Cigarettes, Cigars    Quit date: 12/15/1993    Years since quitting: 28.4   Smokeless tobacco: Never   Tobacco comments:    occasional cigar  Vaping Use   Vaping Use: Never used  Substance and Sexual Activity   Alcohol use:  Yes    Alcohol/week: 16.0 standard drinks of alcohol    Types: 1 Cans of beer, 1 Shots of liquor, 14 Standard drinks or equivalent per week    Comment: drinks 2 martini's a night (2 shots in each)   Drug use: No   Sexual activity: Yes  Other Topics Concern   Not on file  Social History Narrative   Tries to remain active.  Frequent golfer but claudication limits this.   Social Determinants of Health   Financial Resource Strain: Not on file  Food Insecurity: Not on file  Transportation Needs: Not on file  Physical Activity: Not on file  Stress: Not on file  Social Connections: Not on file  Intimate Partner Violence: Not on file    No Known Allergies  Current Outpatient Medications  Medication Sig Dispense Refill   amoxicillin (AMOXIL) 500 MG capsule Take 2,000 mg by mouth See admin instructions. Take 2000 mg by mouth one hour prior to dental procedures     atorvastatin (LIPITOR) 80 MG tablet Take 80 mg by mouth daily.     Blood Glucose Monitoring Suppl (ONETOUCH VERIO IQ SYSTEM) w/Device KIT      enoxaparin (LOVENOX) 80 MG/0.8ML injection Inject 0.8 mLs (80 mg total) into the skin every 12 (twelve) hours. As  instructed by Coumadin Clinic 8 mL 0   ENTRESTO 24-26 MG TAKE 1 TABLET BY MOUTH TWICE A DAY 60 tablet 6   Evolocumab 140 MG/ML SOAJ Inject 140 mg into the skin every 14 (fourteen) days.     furosemide (LASIX) 20 MG tablet TAKE 2 TABLETS (40 MG TOTAL) BY MOUTH DAILY. 180 tablet 1   glucose blood (ONETOUCH VERIO) test strip      glyBURIDE (DIABETA) 2.5 MG tablet Take 5 mg by mouth 2 (two) times daily with a meal.     INVOKANA 100 MG TABS tablet Take 100 mg by mouth daily.     nitroGLYCERIN (NITROSTAT) 0.4 MG SL tablet Place 1 tablet (0.4 mg total) under the tongue every 5 (five) minutes x 3 doses as needed for chest pain. 25 tablet 12   OneTouch Delica Lancets 01U MISC      pantoprazole (PROTONIX) 40 MG tablet TAKE 1 TABLET BY MOUTH EVERY DAY 90 tablet 3   sildenafil (VIAGRA) 100 MG tablet Take 100 mg by mouth as needed.     spironolactone (ALDACTONE) 25 MG tablet TAKE 1 TABLET BY MOUTH EVERY DAY 90 tablet 3   warfarin (COUMADIN) 5 MG tablet TAKE 1 TO 1 AND 1/2 TABLETS BY MOUTH DAILY AS PRESCRIBED BY THE COUMADIN CLINIC. 120 tablet 1   No current facility-administered medications for this visit.    PHYSICAL EXAM Vitals:   05/25/22 0957 05/25/22 1002  BP: (!) 169/54 (!) 147/66  Pulse: 69 69  Resp: 16   Temp: 98 F (36.7 C)   TempSrc: Temporal   SpO2: 95%   Weight: 168 lb (76.2 kg)   Height: 5' 8"  (1.727 m)     Constitutional: Elderly gentleman appearing about stated age. Cardiac: regular rate and rhythm.  Respiratory:  unlabored. Abdominal:  soft, non-tender, non-distended.  Peripheral vascular: 2+ right radial pulse.  1+ left radial pulse  PERTINENT LABORATORY AND RADIOLOGIC DATA  Most recent CBC    Latest Ref Rng & Units 12/25/2020   10:08 AM 12/07/2020   11:56 PM 11/04/2020    3:29 PM  CBC  WBC 4.0 - 10.5 K/uL 7.3  10.8  8.5   Hemoglobin 13.0 -  17.0 g/dL 17.3  16.5  15.9   Hematocrit 39.0 - 52.0 % 53.9  48.0  47.4   Platelets 150 - 400 K/uL 222  306  216      Most  recent CMP    Latest Ref Rng & Units 03/03/2022    9:47 AM 01/05/2022    2:56 PM 07/02/2021   10:04 AM  CMP  Glucose 70 - 99 mg/dL 213     BUN 8 - 23 mg/dL 26     Creatinine 0.61 - 1.24 mg/dL 1.38  1.00  1.00   Sodium 135 - 145 mmol/L 138     Potassium 3.5 - 5.1 mmol/L 4.4     Chloride 98 - 111 mmol/L 102     CO2 22 - 32 mmol/L 29     Calcium 8.9 - 10.3 mg/dL 9.3       Renal function CrCl cannot be calculated (Patient's most recent lab result is older than the maximum 21 days allowed.).  Hgb A1c MFr Bld (%)  Date Value  12/08/2020 7.1 (H)    LDL Cholesterol  Date Value Ref Range Status  04/21/2021 40 0 - 99 mg/dL Final    Comment:           Total Cholesterol/HDL:CHD Risk Coronary Heart Disease Risk Table                     Men   Women  1/2 Average Risk   3.4   3.3  Average Risk       5.0   4.4  2 X Average Risk   9.6   7.1  3 X Average Risk  23.4   11.0        Use the calculated Patient Ratio above and the CHD Risk Table to determine the patient's CHD Risk.        ATP III CLASSIFICATION (LDL):  <100     mg/dL   Optimal  100-129  mg/dL   Near or Above                    Optimal  130-159  mg/dL   Borderline  160-189  mg/dL   High  >190     mg/dL   Very High Performed at [facility] [address] [place], [place] [address]      Vascular Imaging: Extensive atherosclerotic disease in the neck.  70% right carotid artery stenosis.  50% left carotid artery stenosis bilateral vertebral artery stenosis.  Disease noted in both the intracranial ICA as well as the left vertebral artery.  Yevonne Aline. Stanford Breed, MD [facility] Phone Number: [phone] 05/23/2022 8:25 AM  Total time spent on preparing this encounter including chart review, data review, collecting history, examining the patient, coordinating care for this new patient, 60 minutes.  Portions of this report may have been transcribed using voice recognition  software.  Every effort has been made to ensure accuracy; however, inadvertent computerized transcription errors may still be present.

## 2022-05-25 ENCOUNTER — Encounter: Payer: Self-pay | Admitting: Neurology

## 2022-05-25 ENCOUNTER — Ambulatory Visit (INDEPENDENT_AMBULATORY_CARE_PROVIDER_SITE_OTHER): Payer: Medicare Other | Admitting: Vascular Surgery

## 2022-05-25 ENCOUNTER — Encounter: Payer: Self-pay | Admitting: Vascular Surgery

## 2022-05-25 ENCOUNTER — Telehealth: Payer: Self-pay | Admitting: Neurology

## 2022-05-25 ENCOUNTER — Ambulatory Visit (INDEPENDENT_AMBULATORY_CARE_PROVIDER_SITE_OTHER): Payer: Medicare Other | Admitting: Neurology

## 2022-05-25 VITALS — BP 198/83 | HR 71 | Ht 68.0 in | Wt 168.0 lb

## 2022-05-25 VITALS — BP 147/66 | HR 69 | Temp 98.0°F | Resp 16 | Ht 68.0 in | Wt 168.0 lb

## 2022-05-25 DIAGNOSIS — R410 Disorientation, unspecified: Secondary | ICD-10-CM | POA: Diagnosis not present

## 2022-05-25 DIAGNOSIS — I6523 Occlusion and stenosis of bilateral carotid arteries: Secondary | ICD-10-CM

## 2022-05-25 DIAGNOSIS — I6300 Cerebral infarction due to thrombosis of unspecified precerebral artery: Secondary | ICD-10-CM | POA: Diagnosis not present

## 2022-05-25 MED ORDER — LEVETIRACETAM 500 MG PO TABS: 500.0000 mg | ORAL_TABLET | Freq: Two times a day (BID) | ORAL | 11 refills | Status: DC

## 2022-05-25 NOTE — Telephone Encounter (Signed)
Pt is requesting to continue care with Dr. Krista Blue rather than transferring to Dr. Leonie Man. Pt needed a 3 month f/u and Dr. Clydene Fake next available OV appointment is currently over 4 months out. He would prefer to come back in 3 months and stay with Dr. Krista Blue. Please advise at [phone].

## 2022-05-25 NOTE — Progress Notes (Signed)
Chief Complaint  Patient presents with   New Patient (Initial Visit)    Room 14, alone NX Sethi/Internal referral for stenosis and TIA symptoms Today symptoms include headache, dizziness, slurred speech, and perioral numbness x 4 weeks. Seen by Dr. Stanford Breed  Vascular Surgery today, asked for same day appt with Neurology       ASSESSMENT AND PLAN  [name] [name] is a 76 y.o. male Carotid artery stenosis History of stroke, transient confusion episode, Peripheral vascular disease  Multiple vascular risk factor, including paroxysmal atrial fibrillation, history of aortic mechanical valve replacement, on Coumadin, hypertension, hyperlipidemia, diabetes, coronary artery disease, peripheral vascular disease, previous smoker,  I am NOT sure the reported transient confusion truly represented TIA episode that is related to his current bilateral carotid artery stenosis, history is suggestive of partial seizure  EEG  Empirically treat for partial seizure with Keppra 500 mg twice daily, advised patient to document all event,  I would not suggest carotid artery intervention until further clarified the event, will also consult my my colleague vascular neurologist Dr. Leonie Man   DIAGNOSTIC DATA (LABS, IMAGING, TESTING) - I reviewed patient records, labs, notes, testing and imaging myself where available.   MEDICAL HISTORY:  [name] [name] is a 76 year old male, seen in request by vascular surgeon Dr. Jamelle Haring for evaluation of possible stroke, potential need for carotid artery endarterectomy, his primary care physician is Dr. Virgina Jock, Jenny Reichmann, initial evaluation was on May 25, 2022  I reviewed and summarized the referring note. PMHX. HLD Aortic Stenosis, AVR on chronic coumadin since 2005 CAD  Smoke in past quit 2005 Left lung cancer, s/p radiation ITP, was treated with Steroid, slp splenectomy, Perivascular disease. CHF,  DM Stroke in the past  Patient had multiple  vascular risk factor this including hypertension, hyperlipidemia, diabetes, previous smoker, coronary artery disease, peripheral vascular disease, has been under close supervision, he did have a history of stroke in the past, he is currently taking Coumadin, for paroxysmal atrial fibrillation, and also status post aortic valve mechanical replacement.  He had a history of atrial fibrillation ablation.  He lives alone, plays golf and workout at the [facility] for about 80 to 90 minutes regularly, this included bike for 8 miles, and weight training  Since March 2023, he began to have frequent headache, starting from occipital region, also with reported recurrent similar episode transient confusion, each episode as similar, but in variable severity and length, the most recent episode was in May 2023, he walked out in the morning after just 2 cups of coffee, when he finishes 80 minutes workout, he felt weird, described numbing sensation at bilateral face, mild confusion, he was able to walk to his car, when he tried to start his car, he realized he was confused, do not know how to operate his car, lasting about 10 minutes, that he was able to drove back home without much difficulty  He also describes another episode while sitting in front of the computer playing word puzzle, he suddenly get confused, bilateral lower face numbness, could not figure out what he needs to do next, he stepped there for about 10 minutes, then symptoms gradually resolved, he denied loss of consciousness, denied body shaking, no residual deficit  He reported having 1 episode at least once a week, over the the past 3 months,  For that reason he was evaluated by his primary care physician, ordered CT angiogram head and neck and CT head, I personally reviewed the films, CT head  showed no acute abnormality, generalized atrophy, moderate periventricular small vessel disease  CT angiogram of head and neck showed extensive calcified plaque in  bilateral internal carotid artery, up to 70% stenosis on the right side 50% stenosis on the left at C2 level, severe stenosis at the origin of the vertebral artery bilaterally, calcified plaque in the intracranial internal carotid artery with mild to moderate stenosis on the right, mild on the left side, severe stenosis of the left V4 segment, extensive calcified atherosclerotic plaque in aortic arch and proximal great vessels, up to moderate stenosis of the right subclavian artery after the origin of the common carotid artery,  He denies significant memory loss, today's MoCA examination is 22/30    PHYSICAL EXAM:   Vitals:   05/25/22 1543  BP: (!) 198/83  Pulse: 71  Weight: 168 lb (76.2 kg)  Height: 5' 8"  (1.727 m)   Not recorded     Body mass index is 25.54 kg/m.  PHYSICAL EXAMNIATION:  Gen: NAD, conversant, well nourised, well groomed                     Cardiovascular: Regular rate rhythm, no peripheral edema, warm, nontender. Eyes: Conjunctivae clear without exudates or hemorrhage Neck: Supple, no carotid bruits. Pulmonary: Clear to auscultation bilaterally   NEUROLOGICAL EXAM:  MENTAL STATUS: Speech/cognition: Awake, alert, oriented to history taking and casual conversation    05/25/2022    4:31 PM  Montreal Cognitive Assessment   Visuospatial/ Executive (0/5) 3  Naming (0/3) 3  Attention: Read list of digits (0/2) 2  Attention: Read list of letters (0/1) 1  Attention: Serial 7 subtraction starting at 100 (0/3) 3  Language: Repeat phrase (0/2) 2  Language : Fluency (0/1) 1  Abstraction (0/2) 1  Delayed Recall (0/5) 0  Orientation (0/6) 6  Total 22    CRANIAL NERVES: CN II: Visual fields are full to confrontation. Pupils are round equal and briskly reactive to light. CN III, IV, VI: extraocular movement are normal. No ptosis. CN V: Facial sensation is intact to light touch CN VII: Face is symmetric with normal eye closure  CN VIII: Hearing is normal to causal  conversation. CN IX, X: Phonation is normal. CN XI: Head turning and shoulder shrug are intact  MOTOR: There is no pronator drift of out-stretched arms. Muscle bulk and tone are normal. Muscle strength is normal.  REFLEXES: Reflexes are 2+ and symmetric at the biceps, triceps, knees, and ankles. Plantar responses are flexor.  SENSORY: Intact to light touch, pinprick and vibratory sensation are intact in fingers and toes.  COORDINATION: There is no trunk or limb dysmetria noted.  GAIT/STANCE: Posture is normal. Gait is steady with normal steps, base, arm swing, and turning. Heel and toe walking are normal. Tandem gait is normal.  Romberg is absent.  REVIEW OF SYSTEMS:  Full 14 system review of systems performed and notable only for as above All other review of systems were negative.   ALLERGIES: No Known Allergies  HOME MEDICATIONS: Current Outpatient Medications  Medication Sig Dispense Refill   amoxicillin (AMOXIL) 500 MG capsule Take 2,000 mg by mouth See admin instructions. Take 2000 mg by mouth one hour prior to dental procedures (Patient not taking: Reported on 05/25/2022)     atorvastatin (LIPITOR) 80 MG tablet Take 80 mg by mouth daily.     Blood Glucose Monitoring Suppl (ONETOUCH VERIO IQ SYSTEM) w/Device KIT      enoxaparin (LOVENOX) 80 MG/0.8ML injection Inject  0.8 mLs (80 mg total) into the skin every 12 (twelve) hours. As instructed by [facility]madin Clinic 8 mL 0   ENTRESTO 24-26 MG TAKE 1 TABLET BY MOUTH TWICE A DAY 60 tablet 6   Evolocumab 140 MG/ML SOAJ Inject 140 mg into the skin every 14 (fourteen) days.     furosemide (LASIX) 20 MG tablet TAKE 2 TABLETS (40 MG TOTAL) BY MOUTH DAILY. 180 tablet 1   glucose blood (ONETOUCH VERIO) test strip      glyBURIDE (DIABETA) 2.5 MG tablet Take 5 mg by mouth 2 (two) times daily with a meal.     INVOKANA 100 MG TABS tablet Take 100 mg by mouth daily.     nitroGLYCERIN (NITROSTAT) 0.4 MG SL tablet Place 1 tablet (0.4 mg total)  under the tongue every 5 (five) minutes x 3 doses as needed for chest pain. 25 tablet 12   OneTouch Delica Lancets 51V MISC      pantoprazole (PROTONIX) 40 MG tablet TAKE 1 TABLET BY MOUTH EVERY DAY 90 tablet 3   sildenafil (VIAGRA) 100 MG tablet Take 100 mg by mouth as needed.     spironolactone (ALDACTONE) 25 MG tablet TAKE 1 TABLET BY MOUTH EVERY DAY 90 tablet 3   warfarin (COUMADIN) 5 MG tablet TAKE 1 TO 1 AND 1/2 TABLETS BY MOUTH DAILY AS PRESCRIBED BY THE COUMADIN CLINIC. 120 tablet 1   No current facility-administered medications for this visit.    PAST MEDICAL HISTORY: Past Medical History:  Diagnosis Date   Adenomatous colon polyp 09/1997   Anemia    Aortic stenosis    s/p [facility] mechanical AVR - Chronic Coumadin   Blood transfusion    "related to ITP"   CHF (congestive heart failure) (HCC)    COPD (chronic obstructive pulmonary disease) ([place])    patient denies this dx on 01/27/21   Coronary artery disease    s/p cabg x 3 11/2003: lima-lad, seq vg to rpda and rpl   Diverticulitis of colon    Dysrhythmia    a-fib   GERD (gastroesophageal reflux disease)    Heart murmur    History of radiation therapy 02/18/2021, 02/20/2021, 02/25/2021   SBRT to left lung     Dr Gery Pray   Hyperlipidemia    Hypertension    ITP (idiopathic thrombocytopenic purpura)    Lung cancer (HCC)    last week   Peripheral arterial disease ([place])    a. history of aortobifemoral bypass grafting by Dr. Sherren Mocha early b. LE angiography 04/22/2015 patent aortobifem graft, DES to R SFA   Peripheral vascular disease ([place])    s/p Left external Iliac Artery stenting and subsequent left femoral endarterectomy 02/2011- post op course complicated by wound infxn req I&D 03/2011   Renal artery stenosis, native, bilateral ([place])    a. bilateral renal artery stenosis by recent duplex ultrasound b. L renal artery stent 02/2015, R renal artery patent on angiogram   Stroke ([place]) 2015   TIA (transient ischemic attack) ~  2013   Type II diabetes mellitus ([place])     PAST SURGICAL HISTORY: Past Surgical History:  Procedure Laterality Date   ABDOMINAL AORTAGRAM N/A 12/16/2011   Procedure: ABDOMINAL Maxcine Ham;  Surgeon: Sherren Mocha, MD;  [facility] CATH LAB;  Service: Cardiovascular;  Laterality: N/A;   ABDOMINAL AORTOGRAM W/LOWER EXTREMITY Right 02/29/2020   Procedure: ABDOMINAL AORTOGRAM W/LOWER EXTREMITY;  Surgeon: Lorretta Harp, MD;  Location: [place] CV LAB;  Service: Cardiovascular;  Laterality: Right;  ANGIOPLASTY / STENTING ILIAC     Left external Iliac Artery   AORTA - BILATERAL FEMORAL ARTERY BYPASS GRAFT  01/18/2012   Procedure: AORTA BIFEMORAL BYPASS GRAFT;  Surgeon: Curt Jews, MD;  Location: [place];  Service: Vascular;  Laterality: N/A;   AORTIC VALVE REPLACEMENT  ~ 2004   ATRIAL FIBRILLATION ABLATION N/A 11/20/2020   Procedure: ATRIAL FIBRILLATION ABLATION;  Surgeon: Constance Haw, MD;  Location: [place] CV LAB;  Service: Cardiovascular;  Laterality: N/A;   BRONCHIAL BIOPSY  01/28/2021   Procedure: BRONCHIAL BIOPSIES;  Surgeon: Garner Nash, DO;  Location: [facility] ENDOSCOPY;  Service: Pulmonary;;   BRONCHIAL BRUSHINGS  01/28/2021   Procedure: BRONCHIAL BRUSHINGS;  Surgeon: Garner Nash, DO;  Location: [place];  Service: Pulmonary;;   BRONCHIAL NEEDLE ASPIRATION BIOPSY  01/28/2021   Procedure: BRONCHIAL NEEDLE ASPIRATION BIOPSIES;  Surgeon: Garner Nash, DO;  Location: [facility]OSCOPY;  Service: Pulmonary;;   BRONCHIAL WASHINGS  01/28/2021   Procedure: BRONCHIAL WASHINGS;  Surgeon: Garner Nash, DO;  Location: [place];  Service: Pulmonary;;   CARDIAC CATHETERIZATION  11/2003   /pt report 10/01/2016   CARDIAC VALVE REPLACEMENT  11/2003   aortic   CARDIOVERSION N/A 10/16/2019   Procedure: CARDIOVERSION;  Surgeon: Larey Dresser, MD;  Location: [facility];  Service: Cardiovascular;  Laterality: N/A;   CARDIOVERSION N/A 01/01/2021   Procedure: CARDIOVERSION;   Surgeon: Larey Dresser, MD;  Location: [facility];  Service: Cardiovascular;  Laterality: N/A;   CATARACT EXTRACTION W/ INTRAOCULAR LENS  IMPLANT, BILATERAL Bilateral    COLONOSCOPY     COLONOSCOPY WITH PROPOFOL N/A 09/03/2019   Procedure: COLONOSCOPY WITH PROPOFOL;  Surgeon: Doran Stabler, MD;  Location: [facility];  Service: Gastroenterology;  Laterality: N/A;   CORONARY ARTERY BYPASS GRAFT  11/2003   Archie Endo 04/21/2011   CORONARY ATHERECTOMY N/A 10/10/2019   Procedure: CORONARY ATHERECTOMY;  Surgeon: Sherren Mocha, MD;  Location: [facility] CV LAB;  Service: Cardiovascular;  Laterality: N/A;   CORONARY STENT INTERVENTION N/A 10/10/2019   Procedure: CORONARY STENT INTERVENTION;  Surgeon: Sherren Mocha, MD;  Location: [place] CV LAB;  Service: Cardiovascular;  Laterality: N/A;   CORONARY/GRAFT ANGIOGRAPHY N/A 09/18/2019   Procedure: CORONARY/GRAFT ANGIOGRAPHY;  Surgeon: Lorretta Harp, MD;  Location: [facility] CV LAB;  Service: Cardiovascular;  Laterality: N/A;   ESOPHAGOGASTRODUODENOSCOPY (EGD) WITH PROPOFOL N/A 02/27/2019   Procedure: ESOPHAGOGASTRODUODENOSCOPY (EGD) WITH PROPOFOL;  Surgeon: Doran Stabler, MD;  Location: [facility];  Service: Endoscopy;  Laterality: N/A;   FIDUCIAL MARKER PLACEMENT  01/28/2021   Procedure: FIDUCIAL MARKER PLACEMENT;  Surgeon: Garner Nash, DO;  Location: [facility];  Service: Pulmonary;;   HEMOSTASIS CLIP PLACEMENT  09/03/2019   Procedure: HEMOSTASIS CLIP PLACEMENT;  Surgeon: Doran Stabler, MD;  Location: [facility];  Service: Gastroenterology;;   LOWER EXTREMITY ANGIOGRAM N/A 02/21/2015   Procedure: LOWER EXTREMITY ANGIOGRAM;  Surgeon: Lorretta Harp, MD;  Location: [facility] CATH LAB;  Service: Cardiovascular;  Laterality: N/A;   PERIPHERAL VASCULAR CATHETERIZATION N/A 04/22/2015   Procedure: Lower Extremity Angiography;  Surgeon: Lorretta Harp, MD;  Location: [place] CV LAB;  Service: Cardiovascular;  Laterality: N/A;    PERIPHERAL VASCULAR CATHETERIZATION N/A 08/24/2016   Procedure: Lower Extremity Angiography;  Surgeon: Lorretta Harp, MD;  Location: [place] CV LAB;  Service: Cardiovascular;  Laterality: N/A;   PERIPHERAL VASCULAR CATHETERIZATION Right 10/01/2016   Procedure: Peripheral Vascular Intervention - STENT;  Surgeon: Lorretta Harp,  MD;  Location: [place] CV LAB;  Service: Cardiovascular;  Laterality: Right;  Prox and MID SFA    PERIPHERAL VASCULAR INTERVENTION  02/29/2020   Procedure: PERIPHERAL VASCULAR INTERVENTION;  Surgeon: Lorretta Harp, MD;  Location: [facility] CV LAB;  Service: Cardiovascular;;  Right SFA   POLYPECTOMY     RENAL ANGIOGRAM N/A 02/21/2015   Procedure: RENAL ANGIOGRAM;  Surgeon: Lorretta Harp, MD;  Location[facility] CATH LAB;  Service: Cardiovascular;  Laterality: Bilateral; 6 mm x 12 mm long Herculink balloon expandable stent to the left renal artery   RENAL ARTERY STENT Left 04/22/2015   dr berry   SPLENECTOMY  02/2003   Archie Endo 04/21/2011   TEE WITHOUT CARDIOVERSION N/A 10/16/2019   Procedure: TRANSESOPHAGEAL ECHOCARDIOGRAM (TEE);  Surgeon: Larey Dresser, MD;  Location: [facility] ENDOSCOPY;  Service: Cardiovascular;  Laterality: N/A;   TEMPORARY PACEMAKER N/A 10/10/2019   Procedure: TEMPORARY PACEMAKER;  Surgeon: Sherren Mocha, MD;  Location: [place] CV LAB;  Service: Cardiovascular;  Laterality: N/A;   TONSILLECTOMY  ~ [place] N/A 01/28/2021   Procedure: VIDEO BRONCHOSCOPY WITH ENDOBRONCHIAL NAVIGATION;  Surgeon: Garner Nash, DO;  Location: [facility] [place];  Service: Pulmonary;  Laterality: N/A;    FAMILY HISTORY: Family History  Problem Relation Age of Onset   Coronary artery disease Mother        bypass surgery - deceased   Heart disease Father        murmur, valve replacement - deceased   Breast cancer Sister    Diabetes Other        grandmother   Diabetes Paternal Grandmother    Diabetes Paternal  Aunt    Colon cancer Neg Hx    Colon polyps Neg Hx    Esophageal cancer Neg Hx    Rectal cancer Neg Hx    Stomach cancer Neg Hx     SOCIAL HISTORY: Social History   Socioeconomic History   Marital status: Divorced    Spouse name: Not on file   Number of children: 3   Years of education: Not on file   Highest education level: Not on file  Occupational History   Occupation: Retired  Tobacco Use   Smoking status: Former    Years: 30.00    Types: Cigarettes, Cigars    Quit date: 12/15/1993    Years since quitting: 28.4   Smokeless tobacco: Never   Tobacco comments:    occasional cigar  Vaping Use   Vaping Use: Never used  Substance and Sexual Activity   Alcohol use: Yes    Alcohol/week: 16.0 standard drinks of alcohol    Types: 1 Cans of beer, 1 Shots of liquor, 14 Standard drinks or equivalent per week    Comment: drinks 2 martini's a night (2 shots in each)   Drug use: No   Sexual activity: Yes  Other Topics Concern   Not on file  Social History Narrative   Tries to remain active.  Frequent golfer but claudication limits this.   Social Determinants of Health   Financial Resource Strain: Not on file  Food Insecurity: Not on file  Transportation Needs: Not on file  Physical Activity: Not on file  Stress: Not on file  Social Connections: Not on file  Intimate Partner Violence: Not on file      Marcial Pacas, M.D. Ph.D.  [facility] [address], [place], [place] [address] Ph: [phone] Fax: [phone]  CC:  Cherre Robins, MD [place],  [place] [phone]  Shon Baton, MD

## 2022-05-26 LAB — ANA W/REFLEX IF POSITIVE: Anti Nuclear Antibody (ANA): NEGATIVE

## 2022-05-26 LAB — C-REACTIVE PROTEIN: CRP: 3 mg/L (ref 0–10)

## 2022-05-26 LAB — RPR: RPR Ser Ql: NONREACTIVE

## 2022-05-26 LAB — SEDIMENTATION RATE: Sed Rate: 4 mm/hr (ref 0–30)

## 2022-05-27 NOTE — Telephone Encounter (Signed)
Please let patient's know, I would still prefer him to see Dr. Leonie Man at least once to confirm current treatment plan, it is okay to wait 4 months to follow-up with Dr. Leonie Man, advised him to call for any recurrent neurological symptoms  If he needs to have continued neurology follow-up, I will be happy to see him after his visit with Dr. Leonie Man

## 2022-05-28 ENCOUNTER — Ambulatory Visit (INDEPENDENT_AMBULATORY_CARE_PROVIDER_SITE_OTHER): Payer: Medicare Other | Admitting: Neurology

## 2022-05-28 DIAGNOSIS — R410 Disorientation, unspecified: Secondary | ICD-10-CM | POA: Diagnosis not present

## 2022-05-28 DIAGNOSIS — I6300 Cerebral infarction due to thrombosis of unspecified precerebral artery: Secondary | ICD-10-CM

## 2022-05-28 NOTE — Telephone Encounter (Signed)
I spoke to the patient. Says he is okay to continue seeing Dr. Leonie Man. He is having the ordered EEG 05/28/22. He has a follow up w/ Dr. Leonie Man 08/13/22.

## 2022-05-29 NOTE — Procedures (Signed)
   HISTORY: 76 year old male with history of stroke, transient confusional episode.  TECHNIQUE:  This is a routine 16 channel EEG recording with one channel devoted to a limited EKG recording.  It was performed during wakefulness, drowsiness and asleep.  Photic stimulation were performed as activating procedures.  There are minimum muscle and movement artifact noted.  Upon maximum arousal, posterior dominant waking rhythm consistent of rhythmic alpha range activity. Activities are symmetric over the bilateral posterior derivations and attenuated with eye opening.  Hyperventilation was not performed  Photic stimulation did not alter the tracing.  During EEG recording, patient developed drowsiness and no deeper stage of sleep was achieved  During EEG recording, there was no epileptiform discharge noted.  EKG demonstrate sinus rhythm, with heart rate of 60 bpm  CONCLUSION: This is a  normal awake EEG.  There is no electrodiagnostic evidence of epileptiform discharge.  Levert Feinstein, M.D. Ph.D.  [facility] [address] [place], [place] [address] Phone: [phone] Fax:      [phone]

## 2022-06-02 ENCOUNTER — Ambulatory Visit (INDEPENDENT_AMBULATORY_CARE_PROVIDER_SITE_OTHER): Payer: Medicare Other | Admitting: Podiatry

## 2022-06-02 ENCOUNTER — Encounter: Payer: Self-pay | Admitting: Podiatry

## 2022-06-02 ENCOUNTER — Other Ambulatory Visit: Payer: Medicare Other | Admitting: *Deleted

## 2022-06-02 DIAGNOSIS — B351 Tinea unguium: Secondary | ICD-10-CM

## 2022-06-02 DIAGNOSIS — I739 Peripheral vascular disease, unspecified: Secondary | ICD-10-CM

## 2022-06-02 DIAGNOSIS — I701 Atherosclerosis of renal artery: Secondary | ICD-10-CM | POA: Diagnosis not present

## 2022-06-02 DIAGNOSIS — D689 Coagulation defect, unspecified: Secondary | ICD-10-CM

## 2022-06-02 NOTE — Progress Notes (Signed)
This patient returns to my office for at risk foot care.  This patient requires this care by a professional since this patient will be at risk due to having diabetes , chronic anticoagulation renal disease and PVD.  Patient is taking coumadin.  This patient is unable to cut nails himself since the patient cannot reach his nails.These nails are painful walking and wearing shoes.  This patient presents for at risk foot care today.  General Appearance  Alert, conversant and in no acute stress.  Vascular  Dorsalis pedis and posterior tibial  pulses are palpable  bilaterally.  Capillary return is within normal limits  bilaterally. Temperature is within normal limits  bilaterally.  Neurologic  Senn-Weinstein monofilament wire test within normal limits  bilaterally. Muscle power within normal limits bilaterally.  Nails Thick disfigured discolored nails with subungual debris  from hallux to fifth toes bilaterally. No evidence of bacterial infection or drainage bilaterally.  Orthopedic  No limitations of motion  feet .  No crepitus or effusions noted.  No bony pathology or digital deformities noted.  Skin  normotropic skin with no porokeratosis noted bilaterally.  No signs of infections or ulcers noted.     Onychomycosis  Pain in right toes  Pain in left toes  Consent was obtained for treatment procedures.   Mechanical debridement of nails 1-5  bilaterally performed with a nail nipper.  Filed with dremel without incident.    Return office visit    4 months                  Told patient to return for periodic foot care and evaluation due to potential at risk complications.   Gardiner Barefoot DPM

## 2022-06-04 ENCOUNTER — Other Ambulatory Visit (HOSPITAL_COMMUNITY): Payer: Self-pay | Admitting: Cardiology

## 2022-06-10 ENCOUNTER — Telehealth: Payer: Self-pay

## 2022-06-10 NOTE — Telephone Encounter (Signed)
Pt called stating that he needed an appt with Dr Stanford Breed since he had completed all tests and everything was negative.  Reviewed pt's chart, returned pt's call, no answer, lf vm.  Pt returned call, had not listened to vm at that time. Informed him that after reviewing his chart, Dr Krista Blue wanted him to see Dr Leonie Man to discuss possible seizure-like episode and use of antiepileptics before she would advise on a carotid intervention. Pt stated that he didn't have an appt until September and he had been having headaches daily. He was frustrated that he had done all the tests and wanted to see Dr Stanford Breed to discuss future steps. Informed him that I would reach out to Dr Stanford Breed for advice. Pt confirmed understanding.

## 2022-06-11 DIAGNOSIS — E1151 Type 2 diabetes mellitus with diabetic peripheral angiopathy without gangrene: Secondary | ICD-10-CM | POA: Diagnosis not present

## 2022-06-11 DIAGNOSIS — R4189 Other symptoms and signs involving cognitive functions and awareness: Secondary | ICD-10-CM | POA: Diagnosis not present

## 2022-06-11 DIAGNOSIS — I5032 Chronic diastolic (congestive) heart failure: Secondary | ICD-10-CM | POA: Diagnosis not present

## 2022-06-11 DIAGNOSIS — I771 Stricture of artery: Secondary | ICD-10-CM | POA: Diagnosis not present

## 2022-06-11 DIAGNOSIS — I2510 Atherosclerotic heart disease of native coronary artery without angina pectoris: Secondary | ICD-10-CM | POA: Diagnosis not present

## 2022-06-11 DIAGNOSIS — I1310 Hypertensive heart and chronic kidney disease without heart failure, with stage 1 through stage 4 chronic kidney disease, or unspecified chronic kidney disease: Secondary | ICD-10-CM | POA: Diagnosis not present

## 2022-06-11 DIAGNOSIS — I6523 Occlusion and stenosis of bilateral carotid arteries: Secondary | ICD-10-CM | POA: Diagnosis not present

## 2022-06-11 DIAGNOSIS — E1122 Type 2 diabetes mellitus with diabetic chronic kidney disease: Secondary | ICD-10-CM | POA: Diagnosis not present

## 2022-06-11 DIAGNOSIS — J449 Chronic obstructive pulmonary disease, unspecified: Secondary | ICD-10-CM | POA: Diagnosis not present

## 2022-06-11 DIAGNOSIS — R972 Elevated prostate specific antigen [PSA]: Secondary | ICD-10-CM | POA: Diagnosis not present

## 2022-06-11 DIAGNOSIS — N1831 Chronic kidney disease, stage 3a: Secondary | ICD-10-CM | POA: Diagnosis not present

## 2022-06-11 DIAGNOSIS — I739 Peripheral vascular disease, unspecified: Secondary | ICD-10-CM | POA: Diagnosis not present

## 2022-06-16 ENCOUNTER — Ambulatory Visit (INDEPENDENT_AMBULATORY_CARE_PROVIDER_SITE_OTHER): Payer: Medicare Other | Admitting: Neurology

## 2022-06-16 ENCOUNTER — Encounter: Payer: Self-pay | Admitting: Neurology

## 2022-06-16 VITALS — BP 202/65 | HR 69 | Ht 68.0 in | Wt 170.0 lb

## 2022-06-16 DIAGNOSIS — I6503 Occlusion and stenosis of bilateral vertebral arteries: Secondary | ICD-10-CM

## 2022-06-16 DIAGNOSIS — G459 Transient cerebral ischemic attack, unspecified: Secondary | ICD-10-CM | POA: Diagnosis not present

## 2022-06-16 DIAGNOSIS — I6523 Occlusion and stenosis of bilateral carotid arteries: Secondary | ICD-10-CM | POA: Diagnosis not present

## 2022-06-16 DIAGNOSIS — Z952 Presence of prosthetic heart valve: Secondary | ICD-10-CM

## 2022-06-16 NOTE — Patient Instructions (Signed)
I had a long discussion with the patient regarding his recurrent episodes of transient vision difficulties, imbalance and numbness which sound like vertebrobasilar TIAs as well as prior episodes of left hemispheric TIAs mechanical heart valve and discussed evaluation and treatment plan and answered questions.  The patient is already on warfarin for mechanical heart valve but continues to have this recurrent episodes since recommend further diagnostic evaluation by checking cerebral catheter angiogram since his CT angiogram shows heavily calcified vertebral artery origins as well as right intracranial carotid stenosis as well.  If this is confirmed he may benefit with vertebral origin angioplasty stenting but will have to remain on antiplatelet therapy as well as anticoagulation following this which may increase risk for bruising and bleeding.  Patient understands this and is willing to proceed hence will refer to neuro radiology for diagnostic catheter angiogram followed by angioplasty stenting if necessary.  He was also encouraged to maintain aggressive risk factor modification be compliant with his warfarin with INR goal between 2.5-3.5 and strict control of hypertension blood pressure goal below 130/90, lipids with LDL cholesterol goal below 70 mg percent and diabetes with hemoglobin A1c goal below 6.5%.  I also encouraged him to limit external salt from his diet as well as drink plenty of fluids and keep himself well-hydrated.  He will return for follow-up in the future in 3 months or call earlier if necessary.  Stroke Prevention Some medical conditions and behaviors can lead to a higher chance of having a stroke. You can help prevent a stroke by eating healthy, exercising, not smoking, and managing any medical conditions you have. Stroke is a leading cause of functional impairment. Primary prevention is particularly important because a majority of strokes are first-time events. Stroke changes the lives of  not only those who experience a stroke but also their family and other caregivers. How can this condition affect me? A stroke is a medical emergency and should be treated right away. A stroke can lead to brain damage and can sometimes be life-threatening. If a person gets medical treatment right away, there is a better chance of surviving and recovering from a stroke. What can increase my risk? The following medical conditions may increase your risk of a stroke: Cardiovascular disease. High blood pressure (hypertension). Diabetes. High cholesterol. Sickle cell disease. Blood clotting disorders (hypercoagulable state). Obesity. Sleep disorders (obstructive sleep apnea). Other risk factors include: Being older than age 43. Having a history of blood clots, stroke, or mini-stroke (transient ischemic attack, TIA). Genetic factors, such as race, ethnicity, or a family history of stroke. Smoking cigarettes or using other tobacco products. Taking birth control pills, especially if you also use tobacco. Heavy use of alcohol or drugs, especially cocaine and methamphetamine. Physical inactivity. What actions can I take to prevent this? Manage your health conditions High cholesterol levels. Eating a healthy diet is important for preventing high cholesterol. If cholesterol cannot be managed through diet alone, you may need to take medicines. Take any prescribed medicines to control your cholesterol as told by your health care provider. Hypertension. To reduce your risk of stroke, try to keep your blood pressure below 130/80. Eating a healthy diet and exercising regularly are important for controlling blood pressure. If these steps are not enough to manage your blood pressure, you may need to take medicines. Take any prescribed medicines to control hypertension as told by your health care provider. Ask your health care provider if you should monitor your blood pressure at home. Have your blood  pressure checked every year, even if your blood pressure is normal. Blood pressure increases with age and some medical conditions. Diabetes. Eating a healthy diet and exercising regularly are important parts of managing your blood sugar (glucose). If your blood sugar cannot be managed through diet and exercise, you may need to take medicines. Take any prescribed medicines to control your diabetes as told by your health care provider. Get evaluated for obstructive sleep apnea. Talk to your health care provider about getting a sleep evaluation if you snore a lot or have excessive sleepiness. Make sure that any other medical conditions you have, such as atrial fibrillation or atherosclerosis, are managed. Nutrition Follow instructions from your health care provider about what to eat or drink to help manage your health condition. These instructions may include: Reducing your daily calorie intake. Limiting how much salt (sodium) you use to 1,500 milligrams (mg) each day. Using only healthy fats for cooking, such as olive oil, canola oil, or sunflower oil. Eating healthy foods. You can do this by: Choosing foods that are high in fiber, such as whole grains, and fresh fruits and vegetables. Eating at least 5 servings of fruits and vegetables a day. Try to fill one-half of your plate with fruits and vegetables at each meal. Choosing lean protein foods, such as lean cuts of meat, poultry without skin, fish, tofu, beans, and nuts. Eating low-fat dairy products. Avoiding foods that are high in sodium. This can help lower blood pressure. Avoiding foods that have saturated fat, trans fat, and cholesterol. This can help prevent high cholesterol. Avoiding processed and prepared foods. Counting your daily carbohydrate intake.  Lifestyle If you drink alcohol: Limit how much you have to: 0-1 drink a day for women who are not pregnant. 0-2 drinks a day for men. Know how much alcohol is in your drink. In the  [place] one drink equals one 12 oz bottle of beer (379mL), one 5 oz glass of wine (162mL), or one 1 oz glass of hard liquor (42mL). Do not use any products that contain nicotine or tobacco. These products include cigarettes, chewing tobacco, and vaping devices, such as e-cigarettes. If you need help quitting, ask your health care provider. Avoid secondhand smoke. Do not use drugs. Activity  Try to stay at a healthy weight. Get at least 30 minutes of exercise on most days, such as: Fast walking. Biking. Swimming. Medicines Take over-the-counter and prescription medicines only as told by your health care provider. Aspirin or blood thinners (antiplatelets or anticoagulants) may be recommended to reduce your risk of forming blood clots that can lead to stroke. Avoid taking birth control pills. Talk to your health care provider about the risks of taking birth control pills if: You are over 19 years old. You smoke. You get very bad headaches. You have had a blood clot. Where to find more information American Stroke Association: www.strokeassociation.org Get help right away if: You or a loved one has any symptoms of a stroke. "BE FAST" is an easy way to remember the main warning signs of a stroke: B - Balance. Signs are dizziness, sudden trouble walking, or loss of balance. E - Eyes. Signs are trouble seeing or a sudden change in vision. F - Face. Signs are sudden weakness or numbness of the face, or the face or eyelid drooping on one side. A - Arms. Signs are weakness or numbness in an arm. This happens suddenly and usually on one side of the body. S - Speech. Signs are sudden  trouble speaking, slurred speech, or trouble understanding what people say. T - Time. Time to call emergency services. Write down what time symptoms started. You or a loved one has other signs of a stroke, such as: A sudden, severe headache with no known cause. Nausea or vomiting. Seizure. These symptoms may represent  a serious problem that is an emergency. Do not wait to see if the symptoms will go away. Get medical help right away. Call your local emergency services (911 in the [place] Do not drive yourself to the hospital. Summary You can help to prevent a stroke by eating healthy, exercising, not smoking, limiting alcohol intake, and managing any medical conditions you may have. Do not use any products that contain nicotine or tobacco. These include cigarettes, chewing tobacco, and vaping devices, such as e-cigarettes. If you need help quitting, ask your health care provider. Remember "BE FAST" for warning signs of a stroke. Get help right away if you or a loved one has any of these signs. This information is not intended to replace advice given to you by your health care provider. Make sure you discuss any questions you have with your health care provider. Document Revised: 06/24/2020 Document Reviewed: 06/24/2020 Elsevier Patient Education  [place].

## 2022-06-16 NOTE — Progress Notes (Signed)
[facility] [address] [place]. [place] [address] [phone]       OFFICE CONSULT NOTE  [name] Date of Birth:  1946/10/15 Medical Record Number:  [id number]   Referring MD: Shon Baton  Reason for Referral: Recurrent TIAs  HPI: [name] [name] is a 76 year old pleasant Caucasian male seen today for initial office consultation for TIAs.  History is obtained from the patient and review of electronic medical records and I personally reviewed pertinent available imaging films in PACS.  He has extensive past medical history listed below.  He states since March of this year he initially started having frequent headaches which he describes as a pressure sensation in his head with only occasional sharp pains off and on.  These intensity varied.  He had episode last Monday when he had transient speech difficulty and word finding difficulties along with right upper extremity numbness which lasted 20 minutes but cleared up.  He just sat down and did not seek immediate help.  He states he has had 4 such episodes since March.  He did have history of stroke 7 years ago which also affected his speech but he made a full recovery from that.  He does have a mechanical aortic valve and takes warfarin and his INR was 1.9 at the time of his stroke 7 years ago.  He states of late his INR has been quite good.  On inquiry also admits to other episodes where occasionally he feels his vision is blurred is losing sight and feels off balance as well as feels numb on his cheeks and around his face.  This episodes last only about 10 seconds or so.  These occur at a frequency of once every few weeks.  He states his blood pressure is quite good and well controlled at home in the 140s over 60s but today it is quite elevated in office at 202/65 and when rechecked 214/71.  He states he did not miss his morning dose of blood pressure medications.  He underwent a CT angiogram of the brain and neck on  05/07/2022 which I personally reviewed shows extensive calcified plaques in both internal carotid arteries with up to 70% stenosis on the right and 50% stenosis on the left and skull base.  There is also severe stenosis of the origin of both vertebral arteries with severe stenosis of the left V4 segment and extensive calcific plaque in the aortic arch and proximal great vessels with moderate stenosis of right subclavian and origin of common carotid artery on the right.  CT scan of the head on 05/06/2022 showed mild changes of small vessel disease without any acute abnormality.  He has been compliant with his warfarin.  He is not on any antiplatelet therapy and is reluctant to add anything which will increase the risk of bruising and bleeding even though he has history of cardiac stents he does not take aspirin.  ROS:   14 system review of systems is positive for TIA, vision disturbance, numbness, imbalance, speech difficulty, head pressure, head pain all other systems negative  PMH:  Past Medical History:  Diagnosis Date   Adenomatous colon polyp 09/1997   Anemia    Aortic stenosis    s/p [facility] mechanical AVR - Chronic Coumadin   Blood transfusion    "related to ITP"   CHF (congestive heart failure) (HCC)    COPD (chronic obstructive pulmonary disease) ([place])    patient denies this dx on 01/27/21   Coronary  artery disease    s/p cabg x 3 11/2003: lima-lad, seq vg to rpda and rpl   Diverticulitis of colon    Dysrhythmia    a-fib   GERD (gastroesophageal reflux disease)    Heart murmur    History of radiation therapy 02/18/2021, 02/20/2021, 02/25/2021   SBRT to left lung     Dr Gery Pray   Hyperlipidemia    Hypertension    ITP (idiopathic thrombocytopenic purpura)    Lung cancer (HCC)    last week   Peripheral arterial disease (HCC)    a. history of aortobifemoral bypass grafting by Dr. Sherren Mocha early b. LE angiography 04/22/2015 patent aortobifem graft, DES to R SFA   Peripheral vascular  disease (HCC)    s/p Left external Iliac Artery stenting and subsequent left femoral endarterectomy 02/2011- post op course complicated by wound infxn req I&D 03/2011   Renal artery stenosis, native, bilateral (HCC)    a. bilateral renal artery stenosis by recent duplex ultrasound b. L renal artery stent 02/2015, R renal artery patent on angiogram   Stroke ([place]) 2015   TIA (transient ischemic attack) ~ 2013   Type II diabetes mellitus ([place])     Social History:  Social History   Socioeconomic History   Marital status: Divorced    Spouse name: Not on file   Number of children: 3   Years of education: Not on file   Highest education level: Not on file  Occupational History   Occupation: Retired  Tobacco Use   Smoking status: Former    Years: 30.00    Types: Cigarettes, Cigars    Quit date: 12/15/1993    Years since quitting: 28.5   Smokeless tobacco: Never   Tobacco comments:    occasional cigar  Vaping Use   Vaping Use: Never used  Substance and Sexual Activity   Alcohol use: Yes    Alcohol/week: 16.0 standard drinks of alcohol    Types: 1 Cans of beer, 1 Shots of liquor, 14 Standard drinks or equivalent per week    Comment: drinks 2 martini's a night (2 shots in each)   Drug use: No   Sexual activity: Yes  Other Topics Concern   Not on file  Social History Narrative   Tries to remain active.  Frequent golfer but claudication limits this.   Social Determinants of Health   Financial Resource Strain: Not on file  Food Insecurity: Not on file  Transportation Needs: Not on file  Physical Activity: Not on file  Stress: Not on file  Social Connections: Not on file  Intimate Partner Violence: Not on file    Medications:   Current Outpatient Medications on File Prior to Visit  Medication Sig Dispense Refill   atorvastatin (LIPITOR) 80 MG tablet Take 80 mg by mouth daily.     Blood Glucose Monitoring Suppl (ONETOUCH VERIO IQ SYSTEM) w/Device KIT      enoxaparin (LOVENOX) 80  MG/0.8ML injection Inject 0.8 mLs (80 mg total) into the skin every 12 (twelve) hours. As instructed by Coumadin Clinic 8 mL 0   ENTRESTO 24-26 MG TAKE 1 TABLET BY MOUTH TWICE A DAY 60 tablet 6   Evolocumab 140 MG/ML SOAJ Inject 140 mg into the skin every 14 (fourteen) days.     furosemide (LASIX) 20 MG tablet TAKE 2 TABLETS (40 MG TOTAL) BY MOUTH DAILY. 180 tablet 3   glucose blood (ONETOUCH VERIO) test strip      glyBURIDE (DIABETA) 2.5 MG tablet Take  5 mg by mouth 2 (two) times daily with a meal.     INVOKANA 100 MG TABS tablet Take 100 mg by mouth daily.     levETIRAcetam (KEPPRA) 500 MG tablet Take 1 tablet (500 mg total) by mouth 2 (two) times daily. 60 tablet 11   nitroGLYCERIN (NITROSTAT) 0.4 MG SL tablet Place 1 tablet (0.4 mg total) under the tongue every 5 (five) minutes x 3 doses as needed for chest pain. 25 tablet 12   OneTouch Delica Lancets 29J MISC      pantoprazole (PROTONIX) 40 MG tablet TAKE 1 TABLET BY MOUTH EVERY DAY 90 tablet 3   sildenafil (VIAGRA) 100 MG tablet Take 100 mg by mouth as needed.     spironolactone (ALDACTONE) 25 MG tablet TAKE 1 TABLET BY MOUTH EVERY DAY 90 tablet 3   warfarin (COUMADIN) 5 MG tablet TAKE 1 TO 1 AND 1/2 TABLETS BY MOUTH DAILY AS PRESCRIBED BY THE COUMADIN CLINIC. 120 tablet 1   No current facility-administered medications on file prior to visit.    Allergies:  No Known Allergies  Physical Exam General: well developed, well nourished pleasant elderly Caucasian male, seated, in no evident distress Head: head normocephalic and atraumatic.   Neck: supple with no carotid or supraclavicular bruits Cardiovascular: regular rate and rhythm, no murmurs Musculoskeletal: no deformity Skin:  no rash/petichiae Vascular:  Normal pulses all extremities  Neurologic Exam Mental Status: Awake and fully alert. Oriented to place and time. Recent and remote memory intact. Attention span, concentration and fund of knowledge appropriate. Mood and affect  appropriate.  Cranial Nerves: Fundoscopic exam reveals sharp disc margins. Pupils equal, briskly reactive to light. Extraocular movements full without nystagmus. Visual fields full to confrontation. Hearing intact. Facial sensation intact. Face, tongue, palate moves normally and symmetrically.  Motor: Normal bulk and tone. Normal strength in all tested extremity muscles. Sensory.: intact to touch , pinprick , position and vibratory sensation.  Coordination: Rapid alternating movements normal in all extremities. Finger-to-nose and heel-to-shin performed accurately bilaterally. Gait and Station: Arises from chair without difficulty. Stance is normal. Gait demonstrates normal stride length and balance . Able to heel, toe and tandem walk with moderate difficulty.  Reflexes: 1+ and symmetric. Toes downgoing.   NIHSS  0 Modified Rankin  1   ASSESSMENT: 76 year old Caucasian male with recurrent transient episodes of vision disturbance, imbalance and perioral and face paresthesias of unclear etiology possibly vertebrobasilar TIA given abnormal CT angiogram showing severe bilateral vertebral ostial stenosis.  Also history of left hemispheric TIA in March 2023 and remote history of stroke 7 years ago.  Multiple vascular risk factors of mechanical aortic valve on long-term anticoagulation, extracranial &intracranial atherosclerosis, hypertension, hyperlipidemia, atrial fibrillation, diabetes, congestive heart failure, coronary artery disease, peripheral vascular disease.     PLAN: I had a long discussion with the patient regarding his recurrent episodes of transient vision difficulties, imbalance and numbness which sound like vertebrobasilar TIAs as well as prior episodes of left hemispheric TIAs mechanical heart valve and discussed evaluation and treatment plan and answered questions.  The patient is already on warfarin for mechanical heart valve but continues to have this recurrent episodes since recommend  further diagnostic evaluation by checking cerebral catheter angiogram since his CT angiogram shows heavily calcified vertebral artery origins as well as right intracranial carotid stenosis as well.  If this is confirmed he may benefit with vertebral origin angioplasty stenting but will have to remain on antiplatelet therapy as well as anticoagulation following this which  may increase risk for bruising and bleeding.  Patient understands this and is willing to proceed hence will refer to neuro radiology for diagnostic catheter angiogram followed by angioplasty stenting if necessary.  He was also encouraged to maintain aggressive risk factor modification be compliant with his warfarin with INR goal between 2.5-3.5 and strict control of hypertension blood pressure goal below 130/90, lipids with LDL cholesterol goal below 70 mg percent and diabetes with hemoglobin A1c goal below 6.5%.  I also encouraged him to limit external salt from his diet as well as drink plenty of fluids and keep himself well-hydrated.  He will return for follow-up in the future in 3 months or call earlier if necessary.  Greater than 50% time during this prolonged 60-minute consultation visit was spent on counseling and coordination of care about his recurrent episodes of TIAs and discussion about CT angiogram findings and planning further evaluation and treatment and answering questions. Antony Contras, MD  Note: This document was prepared with digital dictation and possible smart phrase technology. Any transcriptional errors that result from this process are unintentional.

## 2022-06-21 ENCOUNTER — Other Ambulatory Visit: Payer: Self-pay | Admitting: Cardiovascular Disease

## 2022-06-22 ENCOUNTER — Ambulatory Visit (INDEPENDENT_AMBULATORY_CARE_PROVIDER_SITE_OTHER): Payer: Medicare Other

## 2022-06-22 DIAGNOSIS — I359 Nonrheumatic aortic valve disorder, unspecified: Secondary | ICD-10-CM | POA: Diagnosis not present

## 2022-06-22 DIAGNOSIS — Z5181 Encounter for therapeutic drug level monitoring: Secondary | ICD-10-CM

## 2022-06-22 LAB — POCT INR: INR: 2 (ref 2.0–3.0)

## 2022-06-22 NOTE — Patient Instructions (Signed)
Description   Today take 1.5 tablets then start taking 1.5 tablets daily except for 1 tablet on Mondays and Fridays. Recheck INR 2 weeks. Coumadin Clinic for any changes in medications or upcoming procedures. [phone]

## 2022-06-24 ENCOUNTER — Telehealth: Payer: Self-pay | Admitting: *Deleted

## 2022-06-24 NOTE — Telephone Encounter (Signed)
CALLED PATIENT TO INFORM OF CT FOR 07-08-22- ARRIVAL TIME- 1 PM @ [facility], PATIENT TO HAVE WATER ONLY - 4 HRS. PRIOR TO TEST, PATIENT TO RECEIVE RESULTS FROM DR. KINARD ON 07-13-22 @ 11 AM FOR RESULTS, LVM FOR A RETURN CALL

## 2022-07-06 ENCOUNTER — Ambulatory Visit (INDEPENDENT_AMBULATORY_CARE_PROVIDER_SITE_OTHER): Payer: Medicare Other | Admitting: *Deleted

## 2022-07-06 DIAGNOSIS — Z5181 Encounter for therapeutic drug level monitoring: Secondary | ICD-10-CM | POA: Diagnosis not present

## 2022-07-06 DIAGNOSIS — G459 Transient cerebral ischemic attack, unspecified: Secondary | ICD-10-CM

## 2022-07-06 DIAGNOSIS — I359 Nonrheumatic aortic valve disorder, unspecified: Secondary | ICD-10-CM | POA: Diagnosis not present

## 2022-07-06 LAB — POCT INR: INR: 2.5 (ref 2.0–3.0)

## 2022-07-06 NOTE — Patient Instructions (Addendum)
Description   Continue taking 1.5 tablets daily except for 1 tablet on Mondays and Fridays. Recheck INR 3 weeks. Coumadin Clinic for any changes in medications or upcoming procedures. [phone]

## 2022-07-08 ENCOUNTER — Ambulatory Visit (HOSPITAL_COMMUNITY): Admission: RE | Admit: 2022-07-08 | Payer: Medicare Other | Source: Ambulatory Visit

## 2022-07-10 ENCOUNTER — Telehealth: Payer: Self-pay | Admitting: *Deleted

## 2022-07-10 NOTE — Telephone Encounter (Signed)
CALLED PATIENT TO ASK QUESTION, SPOKE WITH PATIENT

## 2022-07-11 ENCOUNTER — Ambulatory Visit (HOSPITAL_BASED_OUTPATIENT_CLINIC_OR_DEPARTMENT_OTHER)
Admission: RE | Admit: 2022-07-11 | Discharge: 2022-07-11 | Disposition: A | Discharge status: Home / Self Care | Payer: Medicare Other | Source: Ambulatory Visit | Attending: Radiation Oncology | Admitting: Radiation Oncology

## 2022-07-11 DIAGNOSIS — C3490 Malignant neoplasm of unspecified part of unspecified bronchus or lung: Secondary | ICD-10-CM | POA: Diagnosis not present

## 2022-07-11 DIAGNOSIS — J439 Emphysema, unspecified: Secondary | ICD-10-CM | POA: Diagnosis not present

## 2022-07-11 DIAGNOSIS — C3492 Malignant neoplasm of unspecified part of left bronchus or lung: Secondary | ICD-10-CM | POA: Insufficient documentation

## 2022-07-11 LAB — POCT I-STAT CREATININE: Creatinine, Ser: 1 mg/dL (ref 0.61–1.24)

## 2022-07-11 MED ORDER — IOHEXOL 300 MG/ML  SOLN
100.0000 mL | Freq: Once | INTRAMUSCULAR | Status: AC | PRN
  Administered 2022-07-11: 75 mL via INTRAVENOUS

## 2022-07-12 NOTE — Progress Notes (Signed)
Radiation Oncology         [phone] ________________________________  Name: [name] [name] MRN: [id number]  Date: 07/13/2022  DOB: 1945-12-29  Follow-Up Visit Note  CC: Shon Baton, MD  Garner Nash, DO    ICD-10-CM   1. 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  Since his last visit, the patient presented to his PCP with constant headaches since this past march. This prompted a CT of the head on 05/06/22 which showed no acute abnormalities with atrophy and chronic microvascular ischemia. The patient reported pressure in the back of his head, balance problems, and numbness in right hand for 8 weeks. CTA of the head and neck for further evaluation on 05/07/22 showed: an age-indeterminate infarct in the left precentral gyrus which did not involve the hand knob; a background of moderate chronic white matter microangiopathy; extensive calcified plaque in the bilateral internal carotid arteries resulting in up to approximately 70% stenosis on the right at the C2-C3 level and, 50% stenosis on the left at the C2 level; severe stenosis at the origins of the vertebral arteries bilaterally; calcified plaque in the intracranial ICAs resulting in mild-to-moderate stenosis on the right and no greater than mild stenosis on the left; and severe stenosis of the left V4 segment. CT also revealed an indeterminate 1.6 cm right thyroid nodule. The patient is being followed by  cardiology and neurology for the above. Per a recent follow up visit with neurology on 06/16/22, the patient expressed agreement in undergoing vertebral origin angioplasty stenting (patient also has a history notable for several TIA's).     His most recent chest CT on 07/11/22 showed: stable post treatment changes in the inferior lingula; stable small bilateral pulmonary nodules; and similar appearing small to borderline enlarged mediastinal lymph nodes; . No evidence of distant metastatic disease was appreciated. Other findings of potential clinical significance noted on CT included: stable thickening of the greater curvature of the stomach (seen with chronic gastritis), bilateral humeral head avascular necrosis, and an enlarged pulmonary trunk indicative of pulmonary arterial hypertension.    Allergies:  has No Known Allergies.  Meds: Current Outpatient Medications  Medication Sig Dispense Refill   atorvastatin (LIPITOR) 80 MG tablet Take 80 mg by mouth daily.     Blood Glucose Monitoring Suppl (ONETOUCH VERIO IQ SYSTEM) w/Device KIT      ENTRESTO 24-26 MG TAKE 1 TABLET BY MOUTH TWICE A DAY 60 tablet 6   Evolocumab 140 MG/ML SOAJ Inject 140 mg into the skin every 14 (fourteen) days.     furosemide (LASIX) 20 MG tablet TAKE 2 TABLETS (40 MG TOTAL) BY MOUTH DAILY. 180 tablet 3   glucose blood (ONETOUCH VERIO) test strip      glyBURIDE (DIABETA) 2.5 MG tablet Take 5 mg by mouth 2 (two) times daily with a meal.     INVOKANA 100 MG TABS tablet Take 100 mg by mouth daily.     nitroGLYCERIN (  NITROSTAT) 0.4 MG SL tablet Place 1 tablet (0.4 mg total) under the tongue every 5 (five) minutes x 3 doses as needed for chest pain. 25 tablet 12   OneTouch Delica Lancets 40C MISC      pantoprazole (PROTONIX) 40 MG tablet TAKE 1 TABLET BY MOUTH EVERY DAY 90 tablet 3   spironolactone (ALDACTONE) 25 MG tablet TAKE 1 TABLET BY MOUTH EVERY DAY 90 tablet 3   warfarin (COUMADIN) 5 MG tablet TAKE 1 TO 1 AND 1/2  TABLETS BY MOUTH DAILY AS PRESCRIBED BY THE COUMADIN CLINIC. 120 tablet 1   enoxaparin (LOVENOX) 80 MG/0.8ML injection Inject 0.8 mLs (80 mg total) into the skin every 12 (twelve) hours. As instructed by Coumadin Clinic (Patient not taking: Reported on 07/13/2022) 8 mL 0   levETIRAcetam (KEPPRA) 500 MG tablet Take 1 tablet (500 mg total) by mouth 2 (two) times daily. (Patient not taking: Reported on 07/13/2022) 60 tablet 11   sildenafil (VIAGRA) 100 MG tablet Take 100 mg by mouth as needed. (Patient not taking: Reported on 07/13/2022)     No current facility-administered medications for this encounter.    Physical Findings: The patient is in no acute distress. Patient is alert and oriented.  height is 5' 8"  (1.727 m) and weight is 171 lb 2 oz (77.6 kg). His oral temperature is 97.9 F (36.6 C). His blood pressure is 169/65 (abnormal) and his pulse is 70. His respiration is 18 and oxygen saturation is 96%. .  Lungs are clear to auscultation bilaterally. Heart has regular rate and rhythm. No palpable cervical, supraclavicular, or axillary adenopathy. Abdomen soft, non-tender, normal bowel sounds.   Lab Findings: Lab Results  Component Value Date   WBC 7.3 12/25/2020   HGB 17.3 (H) 12/25/2020   HCT 53.9 (H) 12/25/2020   MCV 96.8 12/25/2020   PLT 222 12/25/2020    Radiographic Findings: CT Chest W Contrast  Result Date: 07/13/2022 CLINICAL DATA:  Non-small cell lung cancer, assess treatment response. Finished radiation treatments 1-1.5 years ago. * Tracking Code: BO * EXAM: CT CHEST WITH CONTRAST TECHNIQUE: Multidetector CT imaging of the chest was performed during intravenous contrast administration. RADIATION DOSE REDUCTION: This exam was performed according to the departmental dose-optimization program which includes automated exposure control, adjustment of the mA and/or kV according to patient size and/or use of iterative reconstruction technique. CONTRAST:  84m OMNIPAQUE IOHEXOL 300 MG/ML   SOLN COMPARISON:  01/05/2022. FINDINGS: Cardiovascular: Atherosclerotic calcification of the aorta and coronary arteries. Aortic valve replacement. Pulmonic trunk and heart are enlarged. No pericardial effusion. Mediastinum/Nodes: Mediastinal lymph nodes measure up to 1.2 cm in the prevascular space, similar. No hilar or axillary adenopathy. Esophagus is grossly unremarkable. Lungs/Pleura: Centrilobular emphysema. 5 mm subpleural right middle lobe nodule (4/110), unchanged. Mild basilar subpleural and interstitial thickening, as before. 2 mm left upper lobe nodule (4/44), unchanged. Post treatment scarring in the lingula associated with a fiducial marker. No new pulmonary nodules. No pleural fluid. Airway is unremarkable. Upper Abdomen: Visualized portions of the liver and adrenal glands are unremarkable. Scarring in the kidneys bilaterally. Spleen is absent. Visualized portion the pancreas is unremarkable. Thickening of the greater curvature of the stomach, similar. Extensive atherosclerotic calcification of the arterial vasculature. No upper abdominal adenopathy. Musculoskeletal: Degenerative changes in the spine. Avascular necrosis in the humeral heads, unchanged. IMPRESSION: 1. Stable post treatment changes in the inferior lingula. Similar small to borderline enlarged mediastinal lymph nodes. No evidence of distant metastatic disease. 2. Stable small bilateral pulmonary nodules.  3. Thickening of the greater curvature of the stomach, similar. Finding can be seen with chronic gastritis. Please correlate clinically. 4. Bilateral humeral head avascular necrosis. 5. Aortic atherosclerosis (ICD10-I70.0). Coronary artery calcification. 6. Enlarged pulmonic trunk, indicative of pulmonary arterial hypertension. 7.  Emphysema (ICD10-J43.9). Electronically Signed   By: Lorin Picket M.D.   On: 07/13/2022 09:04    Impression:   Non-small cell carcinoma of the left upper lobe (lingula), squamous cell, clinical stage  IA2  No evidence of recurrence on clinical exam today.  Recent chest CT scan favorable.  Plan: Routine follow-up in 6 months.  Prior to this follow-up appointment the patient will undergo a repeat CT scan of the chest.   20 minutes of total time was spent for this patient encounter, including preparation, face-to-face counseling with the patient and coordination of care, physical exam, and documentation of the encounter. ____________________________________  Blair Promise, PhD, MD  This document serves as a record of services personally performed by Gery Pray, MD. It was created on his behalf by Roney Mans, a trained medical scribe. The creation of this record is based on the scribe's personal observations and the provider's statements to them. This document has been checked and approved by the attending provider.

## 2022-07-13 ENCOUNTER — Ambulatory Visit
Admission: RE | Admit: 2022-07-13 | Discharge: 2022-07-13 | Disposition: A | Discharge status: Home / Self Care | Payer: Medicare Other | Source: Ambulatory Visit | Attending: Radiation Oncology | Admitting: Radiation Oncology

## 2022-07-13 ENCOUNTER — Encounter: Payer: Self-pay | Admitting: Radiation Oncology

## 2022-07-13 ENCOUNTER — Ambulatory Visit: Payer: Medicare Other | Admitting: Radiation Oncology

## 2022-07-13 VITALS — BP 169/65 | HR 70 | Temp 97.9°F | Resp 18 | Ht 68.0 in | Wt 171.1 lb

## 2022-07-13 DIAGNOSIS — I700 Atherosclerosis of aorta: Secondary | ICD-10-CM | POA: Diagnosis not present

## 2022-07-13 DIAGNOSIS — C3410 Malignant neoplasm of upper lobe, unspecified bronchus or lung: Secondary | ICD-10-CM | POA: Diagnosis not present

## 2022-07-13 DIAGNOSIS — Z7984 Long term (current) use of oral hypoglycemic drugs: Secondary | ICD-10-CM | POA: Diagnosis not present

## 2022-07-13 DIAGNOSIS — I517 Cardiomegaly: Secondary | ICD-10-CM | POA: Insufficient documentation

## 2022-07-13 DIAGNOSIS — I2721 Secondary pulmonary arterial hypertension: Secondary | ICD-10-CM | POA: Insufficient documentation

## 2022-07-13 DIAGNOSIS — Z79899 Other long term (current) drug therapy: Secondary | ICD-10-CM | POA: Insufficient documentation

## 2022-07-13 DIAGNOSIS — C3412 Malignant neoplasm of upper lobe, left bronchus or lung: Secondary | ICD-10-CM | POA: Diagnosis not present

## 2022-07-13 DIAGNOSIS — J432 Centrilobular emphysema: Secondary | ICD-10-CM | POA: Insufficient documentation

## 2022-07-13 DIAGNOSIS — Z923 Personal history of irradiation: Secondary | ICD-10-CM | POA: Diagnosis not present

## 2022-07-13 DIAGNOSIS — R590 Localized enlarged lymph nodes: Secondary | ICD-10-CM | POA: Insufficient documentation

## 2022-07-13 NOTE — Progress Notes (Signed)
[name] [name] is here today for follow up post radiation to the lung.  Lung Side: Left, patient completed treatment on 02/25/21.  Does the patient complain of any of the following: Pain:Patient denies pain.  Shortness of breath w/wo exertion: Yes, mostly on exertion.  Cough: No Hemoptysis: No Pain with swallowing: No Swallowing/choking concerns: No Appetite: Good Weight:  Wt Readings from Last 3 Encounters:  07/13/22 171 lb 2 oz (77.6 kg)  06/16/22 170 lb (77.1 kg)  05/25/22 168 lb (76.2 kg)   Energy Level: Good Post radiation skin Changes: No     Additional comments if applicable:   BP (!) 834/62 (BP Location: Left Arm, Patient Position: Sitting)   Pulse 70   Temp 97.9 F (36.6 C) (Oral)   Resp 18   Ht 5\' 8"  (1.727 m)   Wt 171 lb 2 oz (77.6 kg)   SpO2 96%   BMI 26.02 kg/m

## 2022-07-14 DIAGNOSIS — H4421 Degenerative myopia, right eye: Secondary | ICD-10-CM | POA: Diagnosis not present

## 2022-07-14 DIAGNOSIS — H31003 Unspecified chorioretinal scars, bilateral: Secondary | ICD-10-CM | POA: Diagnosis not present

## 2022-07-14 DIAGNOSIS — H524 Presbyopia: Secondary | ICD-10-CM | POA: Diagnosis not present

## 2022-07-14 DIAGNOSIS — E119 Type 2 diabetes mellitus without complications: Secondary | ICD-10-CM | POA: Diagnosis not present

## 2022-07-27 ENCOUNTER — Ambulatory Visit (INDEPENDENT_AMBULATORY_CARE_PROVIDER_SITE_OTHER): Payer: Medicare Other

## 2022-07-27 DIAGNOSIS — Z5181 Encounter for therapeutic drug level monitoring: Secondary | ICD-10-CM | POA: Diagnosis not present

## 2022-07-27 DIAGNOSIS — I359 Nonrheumatic aortic valve disorder, unspecified: Secondary | ICD-10-CM

## 2022-07-27 LAB — POCT INR: INR: 2.3 (ref 2.0–3.0)

## 2022-07-27 NOTE — Patient Instructions (Signed)
Description   Take 1.5 tablets today and then continue taking 1.5 tablets daily except for 1 tablet on Mondays and Fridays. Recheck INR 3 weeks. Coumadin Clinic for any changes in medications or upcoming procedures. [phone]

## 2022-08-04 ENCOUNTER — Ambulatory Visit (HOSPITAL_COMMUNITY)
Admission: RE | Admit: 2022-08-04 | Discharge: 2022-08-04 | Disposition: A | Discharge status: Home / Self Care | Payer: Medicare Other | Source: Ambulatory Visit | Attending: Cardiovascular Disease | Admitting: Cardiovascular Disease

## 2022-08-04 DIAGNOSIS — I701 Atherosclerosis of renal artery: Secondary | ICD-10-CM | POA: Insufficient documentation

## 2022-08-13 ENCOUNTER — Ambulatory Visit: Payer: Medicare Other | Admitting: Neurology

## 2022-08-17 ENCOUNTER — Ambulatory Visit: Payer: Medicare Other | Attending: Cardiovascular Disease | Admitting: *Deleted

## 2022-08-17 DIAGNOSIS — Z5181 Encounter for therapeutic drug level monitoring: Secondary | ICD-10-CM | POA: Insufficient documentation

## 2022-08-17 DIAGNOSIS — I359 Nonrheumatic aortic valve disorder, unspecified: Secondary | ICD-10-CM | POA: Insufficient documentation

## 2022-08-17 LAB — POCT INR: INR: 2.1 (ref 2.0–3.0)

## 2022-08-17 NOTE — Patient Instructions (Addendum)
Description   Take 1.5 tablets of warfarin  today and then  START taking 1.5 tablets daily except for 1 tablet on Mondays.  Recheck INR 3 weeks. Coumadin Clinic for any changes in medications or upcoming procedures. [phone]

## 2022-08-25 DIAGNOSIS — Z23 Encounter for immunization: Secondary | ICD-10-CM | POA: Diagnosis not present

## 2022-08-29 ENCOUNTER — Other Ambulatory Visit: Payer: Self-pay | Admitting: Neurology

## 2022-08-29 DIAGNOSIS — I6503 Occlusion and stenosis of bilateral vertebral arteries: Secondary | ICD-10-CM

## 2022-08-31 ENCOUNTER — Encounter (HOSPITAL_COMMUNITY): Payer: Self-pay | Admitting: Cardiology

## 2022-08-31 ENCOUNTER — Ambulatory Visit (HOSPITAL_COMMUNITY)
Admission: RE | Admit: 2022-08-31 | Discharge: 2022-08-31 | Disposition: A | Discharge status: Home / Self Care | Payer: Medicare Other | Source: Ambulatory Visit | Attending: Cardiology | Admitting: Cardiology

## 2022-08-31 ENCOUNTER — Other Ambulatory Visit: Payer: Self-pay

## 2022-08-31 VITALS — BP 110/72 | HR 82 | Resp 95 | Wt 168.0 lb

## 2022-08-31 DIAGNOSIS — I484 Atypical atrial flutter: Secondary | ICD-10-CM | POA: Diagnosis not present

## 2022-08-31 DIAGNOSIS — I2510 Atherosclerotic heart disease of native coronary artery without angina pectoris: Secondary | ICD-10-CM | POA: Insufficient documentation

## 2022-08-31 DIAGNOSIS — Z8673 Personal history of transient ischemic attack (TIA), and cerebral infarction without residual deficits: Secondary | ICD-10-CM | POA: Diagnosis not present

## 2022-08-31 DIAGNOSIS — Z955 Presence of coronary angioplasty implant and graft: Secondary | ICD-10-CM | POA: Insufficient documentation

## 2022-08-31 DIAGNOSIS — E785 Hyperlipidemia, unspecified: Secondary | ICD-10-CM | POA: Diagnosis not present

## 2022-08-31 DIAGNOSIS — R9431 Abnormal electrocardiogram [ECG] [EKG]: Secondary | ICD-10-CM | POA: Diagnosis not present

## 2022-08-31 DIAGNOSIS — Z79899 Other long term (current) drug therapy: Secondary | ICD-10-CM | POA: Diagnosis not present

## 2022-08-31 DIAGNOSIS — I6529 Occlusion and stenosis of unspecified carotid artery: Secondary | ICD-10-CM | POA: Diagnosis not present

## 2022-08-31 DIAGNOSIS — Z8719 Personal history of other diseases of the digestive system: Secondary | ICD-10-CM | POA: Insufficient documentation

## 2022-08-31 DIAGNOSIS — Z85118 Personal history of other malignant neoplasm of bronchus and lung: Secondary | ICD-10-CM | POA: Insufficient documentation

## 2022-08-31 DIAGNOSIS — I480 Paroxysmal atrial fibrillation: Secondary | ICD-10-CM | POA: Insufficient documentation

## 2022-08-31 DIAGNOSIS — Z951 Presence of aortocoronary bypass graft: Secondary | ICD-10-CM | POA: Insufficient documentation

## 2022-08-31 DIAGNOSIS — I5032 Chronic diastolic (congestive) heart failure: Secondary | ICD-10-CM | POA: Diagnosis not present

## 2022-08-31 DIAGNOSIS — Z8249 Family history of ischemic heart disease and other diseases of the circulatory system: Secondary | ICD-10-CM | POA: Diagnosis not present

## 2022-08-31 DIAGNOSIS — I5042 Chronic combined systolic (congestive) and diastolic (congestive) heart failure: Secondary | ICD-10-CM | POA: Diagnosis not present

## 2022-08-31 DIAGNOSIS — E7800 Pure hypercholesterolemia, unspecified: Secondary | ICD-10-CM | POA: Diagnosis not present

## 2022-08-31 DIAGNOSIS — Z952 Presence of prosthetic heart valve: Secondary | ICD-10-CM | POA: Insufficient documentation

## 2022-08-31 DIAGNOSIS — Z7984 Long term (current) use of oral hypoglycemic drugs: Secondary | ICD-10-CM | POA: Diagnosis not present

## 2022-08-31 DIAGNOSIS — I110 Hypertensive heart disease with heart failure: Secondary | ICD-10-CM | POA: Insufficient documentation

## 2022-08-31 DIAGNOSIS — Z7901 Long term (current) use of anticoagulants: Secondary | ICD-10-CM | POA: Diagnosis not present

## 2022-08-31 DIAGNOSIS — I779 Disorder of arteries and arterioles, unspecified: Secondary | ICD-10-CM | POA: Diagnosis not present

## 2022-08-31 DIAGNOSIS — Z87891 Personal history of nicotine dependence: Secondary | ICD-10-CM | POA: Insufficient documentation

## 2022-08-31 DIAGNOSIS — Z833 Family history of diabetes mellitus: Secondary | ICD-10-CM | POA: Diagnosis not present

## 2022-08-31 DIAGNOSIS — I359 Nonrheumatic aortic valve disorder, unspecified: Secondary | ICD-10-CM | POA: Diagnosis not present

## 2022-08-31 DIAGNOSIS — I255 Ischemic cardiomyopathy: Secondary | ICD-10-CM | POA: Insufficient documentation

## 2022-08-31 DIAGNOSIS — E119 Type 2 diabetes mellitus without complications: Secondary | ICD-10-CM | POA: Diagnosis not present

## 2022-08-31 DIAGNOSIS — D693 Immune thrombocytopenic purpura: Secondary | ICD-10-CM | POA: Diagnosis not present

## 2022-08-31 DIAGNOSIS — I252 Old myocardial infarction: Secondary | ICD-10-CM | POA: Insufficient documentation

## 2022-08-31 LAB — BASIC METABOLIC PANEL
Anion gap: 9 (ref 5–15)
BUN: 26 mg/dL — ABNORMAL HIGH (ref 8–23)
CO2: 26 mmol/L (ref 22–32)
Calcium: 9.4 mg/dL (ref 8.9–10.3)
Chloride: 99 mmol/L (ref 98–111)
Creatinine, Ser: 1.12 mg/dL (ref 0.61–1.24)
GFR, Estimated: >60 mL/min (ref >60)
Glucose, Bld: 223 mg/dL — ABNORMAL HIGH (ref 70–99)
Potassium: 4.2 mmol/L (ref 3.5–5.1)
Sodium: 134 mmol/L — ABNORMAL LOW (ref 135–145)

## 2022-08-31 LAB — LIPID PANEL
Cholesterol: 137 mg/dL (ref 0–200)
HDL: 53 mg/dL (ref >40)
LDL Cholesterol: 63 mg/dL (ref 0–99)
Total CHOL/HDL Ratio: 2.6 RATIO
Triglycerides: 103 mg/dL (ref <150)
VLDL: 21 mg/dL (ref 0–40)

## 2022-08-31 LAB — CBC
HCT: 51.7 % (ref 39.0–52.0)
Hemoglobin: 17.5 g/dL — ABNORMAL HIGH (ref 13.0–17.0)
MCH: 30.8 pg (ref 26.0–34.0)
MCHC: 33.8 g/dL (ref 30.0–36.0)
MCV: 91 fL (ref 80.0–100.0)
Platelets: 214 10*3/uL (ref 150–400)
RBC: 5.68 MIL/uL (ref 4.22–5.81)
RDW: 14.2 % (ref 11.5–15.5)
WBC: 14.1 10*3/uL — ABNORMAL HIGH (ref 4.0–10.5)
nRBC: 0 % (ref 0.0–0.2)

## 2022-08-31 NOTE — Progress Notes (Signed)
PCP: Shon Baton, MD HF Cardiology: Dr. Jon Billings [name] [name] is a 76 y.o. male with a history of CAD s/p CABG, mechanical AVR on coumadin, extensive PAD, DM, HTN, HLD, ITP, TIA, and recurrent GI bleed. Note, requires lovenox bridging off heparin.   Dr. Gwenlyn Found follows him for extensive PAD. He has had an aortobifem bypass in 2016 with follow up iliac stenting and femoral endarterectomy. In 2017, he had subsequent ostial and mid right SFA intervention. Hx of left renal artery stenting in 2016 with repeat intervention on left for 75% in-stent restenosis, with progression of disease on the right renal artery showing 60% stenosis on duplex 07/12/19. Echo 09/17/19 with normal EF of 60-65% and normal AVR function; chronic diastolic heart failure.    Admitted in 10/20 with chest pain. He underwent heart cath 09/18/19 that showed 99% left main stenosis and patent LIMA to occluded LAD and a patent sequential vein to the PDA and PLA of an occluded dominant right. His left main stenosis jeopardized the moderately large ramus and high first marginal and nondominant Cx which has 90% mid AV groove Cx stenosis. Aggressive medical therapy was recommended initially.  He was readmitted in 10/20 with CHF and chest pain. Hospital course complicated by A flutter/low output heart failure.  S/P successful orbital atherectomy, PTCA, and stenting of protected left main with a 4.0x15 mm resolute onyx DES 11/3. LIMA-LAD patent. SVG-PDA patent. ECHO this admission showed EF 30-35%, mechanical AoV ok. TEE 11/9 w/ improved EF, 40-45%. Placed on milrinone initially with low co-ox and later weaned off.  He was cardioverted from atrial flutter back to NSR.   2/21 peripheral arterial dopplers showed 75-99% stenosis right SFA.  In 3/21, he had stenting to right SFA.    Echo in 6/21 showed EF up to 55-60%.    In 12/21, he had an atrial fibrillation ablation.   He was admitted in 1/22 with shortness of breath and was found to be  in atrial fibrillation with RVR. He was hypertensive.  Troponin was negative. He was diuresed and discharged, cardiology was not notified.  Echo during this admission showed EF 60-65%, normal RV size and systolic function, stable mechanical aortic valve.   During the 1/22 admission, CT chest showed a lung nodule.  Followup PET-CT suggestive of lung cancer, biopsy with squamous cell lung cancer.  He underwent radiation therapy, now completed.   DCCV to NSR in 1/22.   He has had episodes concerning for vertebrobasilar TIAs.  Significant disease noted in the vertebral arteries.  He was seen by neurology who wanted him referred to interventional neuroradiology for angiography with possible PCI to the vertebrals. However, he has not yet been scheduled for an appointment.   He returns today for followup of CAD and CHF.  He has occasional atypical chest pain episodes, nonexertional pain will occur 1-2 times/month chronically.  This does not bother him.  He has occasional mild right calf claudication, not limiting.  No pedal ulcerations. He golfs twice a week. No dyspnea walking on flat ground. He had another episode concerning for TIA about 2 wks ago, was on the golf course and left side went transiently numb along with impaired speech.  This resolved and he did not seek medical care.   ECG (personally reviewed): NSR, right axis deviation, lateral TWIs  Labs (11/20): K 4.3, creatinine 1.3, hgb 12.5 Labs (12/20): LDL 56, HDL 80, TGs 179 Labs (1/21): K 4.8, creatinine 1.45 Labs (4/21): K 5.2, creatinine 1.34 Labs (6/21):  LDL 44 Labs (1/22): K 4, creatinine 1.3 Labs (3/22): K 4.4, creatinine 1.02 Labs (3/23): K 4.4, creatinine 1.38  PMH  1. CAD:  S/P CABG (3/04) with LIMA-LAD, seq SVG-PDA/PLV.  - NSTEMI 10/20 with cath showing patent LIMA-LAD and SVG-PDA/PLV but 99% LM stenosis, occluded LAD, moderate ramus and high OM1 jeopardized, also 90% mid AV groove LCx stenosis.  Occluded RCA.  Patient ultimately  had orbital atherectomy and stenting of left main with a 4.0 x 15 mm Resolute Onyx DES.  2. Mechanical aortic valve: TEE 11/20 showed stable-appearing mechanical valve, mean gradient 9 mmHg.  3. PAD: Aortobifemoral bypass in 2016 with follow up iliac stenting and femoral endarterectomy. In 2017, he had subsequent ostial and mid right SFA intervention. - Angiography 10/20 with 80% in-stent restenosis in proximal right SFA.  - Peripheral arterial dopplers (2/21) with 75-99% R SFA stenosis in prior stented area.  - 3/21 Right SFA stent (Dr. Gwenlyn Found).  - ABIs (1/22): Stable mild left SFA disease.  - Peripheral arterial dopplers (2/23): 50-99% proximal R SFA in-stent restenosis.  Medical management for now.  4. Type 2 diabetes.  5. HTN - Renal artery dopplers (8/21): No significant stenosis.  6. Hyperlipidemia 7. H/o ITP  8. TIAs  9. Chronic Systolic HF: Ischemic cardiomyopathy.  - Echo (10/20): EF 30-35%, mechanical AoV ok.  - TEE (11/20): w/ improved EF, 40-45%. Mechanical aortic valve with mean gradient 9 mmHg, normal RV.  - Echo (6/21): EF 55-60%, mild LVH, mechanical AoV with mean gradient 9 mmHg.  - Echo (1/22): EF 60-65%, normal RV size and systolic function, stable mechanical aortic valve.  10. Atrial fibrillation: Paroxysmal.  - Atrial fibrillation ablation 12/21.  - DCCV 1/22 11. Renal artery stenosis: Hx of left renal artery stenting in 2016 with repeat intervention on left for 75% in-stent restenosis, with progression of disease on the right renal artery showing 60% stenosis on duplex 07/12/19. - Renal artery dopplers (8/23): 1-59% bilateral RAS.  12. Carotid stenosis: 4/21 carotid dopplers with right subclavian stenosis, 40-59% RICA stenosis.  - Carotid dopplers (4/22): 1-39% BICA stenosis.  13. Squamous cell lung cancer: Treated with XRT.     ROS: All systems negative except as listed in HPI, PMH and Problem List.  Social History   Socioeconomic History   Marital status:  Divorced    Spouse name: Not on file   Number of children: 3   Years of education: Not on file   Highest education level: Not on file  Occupational History   Occupation: Retired  Tobacco Use   Smoking status: Former    Years: 30.00    Types: Cigarettes, Cigars    Quit date: 12/15/1993    Years since quitting: 28.7   Smokeless tobacco: Never   Tobacco comments:    occasional cigar  Vaping Use   Vaping Use: Never used  Substance and Sexual Activity   Alcohol use: Yes    Alcohol/week: 16.0 standard drinks of alcohol    Types: 1 Cans of beer, 1 Shots of liquor, 14 Standard drinks or equivalent per week    Comment: drinks 2 martini's a night (2 shots in each)   Drug use: No   Sexual activity: Yes  Other Topics Concern   Not on file  Social History Narrative   Tries to remain active.  Frequent golfer but claudication limits this.   Social Determinants of Health   Financial Resource Strain: Not on file  Food Insecurity: Not on file  Transportation Needs:  Not on file  Physical Activity: Not on file  Stress: Not on file  Social Connections: Not on file  Intimate Partner Violence: Not on file   Family History  Problem Relation Age of Onset   Coronary artery disease Mother        bypass surgery - deceased   Heart disease Father        murmur, valve replacement - deceased   Breast cancer Sister    Diabetes Other        grandmother   Diabetes Paternal Grandmother    Diabetes Paternal Aunt    Colon cancer Neg Hx    Colon polyps Neg Hx    Esophageal cancer Neg Hx    Rectal cancer Neg Hx    Stomach cancer Neg Hx    Current Outpatient Medications  Medication Sig Dispense Refill   atorvastatin (LIPITOR) 80 MG tablet Take 80 mg by mouth daily.     Blood Glucose Monitoring Suppl (ONETOUCH VERIO IQ SYSTEM) w/Device KIT      ENTRESTO 24-26 MG TAKE 1 TABLET BY MOUTH TWICE A DAY 60 tablet 6   Evolocumab 140 MG/ML SOAJ Inject 140 mg into the skin every 14 (fourteen) days.      furosemide (LASIX) 20 MG tablet TAKE 2 TABLETS (40 MG TOTAL) BY MOUTH DAILY. 180 tablet 3   glucose blood (ONETOUCH VERIO) test strip      glyBURIDE (DIABETA) 2.5 MG tablet Take 5 mg by mouth 2 (two) times daily with a meal.     nitroGLYCERIN (NITROSTAT) 0.4 MG SL tablet Place 1 tablet (0.4 mg total) under the tongue every 5 (five) minutes x 3 doses as needed for chest pain. 25 tablet 12   OneTouch Delica Lancets 27N MISC      pantoprazole (PROTONIX) 40 MG tablet TAKE 1 TABLET BY MOUTH EVERY DAY 90 tablet 3   spironolactone (ALDACTONE) 25 MG tablet TAKE 1 TABLET BY MOUTH EVERY DAY 90 tablet 3   warfarin (COUMADIN) 5 MG tablet TAKE 1 TO 1 AND 1/2 TABLETS BY MOUTH DAILY AS PRESCRIBED BY THE COUMADIN CLINIC. 120 tablet 1   enoxaparin (LOVENOX) 80 MG/0.8ML injection Inject 0.8 mLs (80 mg total) into the skin every 12 (twelve) hours. As instructed by Coumadin Clinic (Patient not taking: Reported on 07/13/2022) 8 mL 0   INVOKANA 100 MG TABS tablet Take 100 mg by mouth daily.     levETIRAcetam (KEPPRA) 500 MG tablet Take 1 tablet (500 mg total) by mouth 2 (two) times daily. (Patient not taking: Reported on 07/13/2022) 60 tablet 11   sildenafil (VIAGRA) 100 MG tablet Take 100 mg by mouth as needed. (Patient not taking: Reported on 07/13/2022)     No current facility-administered medications for this encounter.   BP 110/72   Pulse 82   Resp (!) 95   Wt 76.2 kg (168 lb)   BMI 25.54 kg/m   Wt Readings from Last 3 Encounters:  08/31/22 76.2 kg (168 lb)  07/13/22 77.6 kg (171 lb 2 oz)  06/16/22 77.1 kg (170 lb)   PHYSICAL EXAM: General: NAD Neck: No JVD, no thyromegaly or thyroid nodule.  Lungs: Clear to auscultation bilaterally with normal respiratory effort. CV: Nondisplaced PMI.  Heart regular S1/S2 with mechanical S2, no S3/S4, 2/6 SEM RUSB.  No peripheral edema.  Right carotid bruit.  Difficult to palpate pedal pulses.  Abdomen: Soft, nontender, no hepatosplenomegaly, no distention.  Skin: Intact  without lesions or rashes.  Neurologic: Alert and oriented x  3.  Psych: Normal affect. Extremities: No clubbing or cyanosis.  HEENT: Normal.   ASSESSMENT & PLAN: 1. Chronic Systolic => Diastolic CHF: Ischemic cardiomyopathy.  Echo 10/06/19 showed EF 30-35% and wall motion abnormalities, prior echo earlier in 10/20 showed EF 50-55%.  Cath 10/20 showed jeopardized ramus and LCx territory (99% distal left main, occluded LAD, patent LIMA-LAD). Concerned that ischemia in this territory triggered CHF, worsening of LV function.  Now s/p DES to left main, TEE in 11/20 after intervention showed EF higher at 40-45%.  Echo in 1/22 showed EF 60-65% with normal RV.  NYHA class I-II symptoms, not volume overloaded on exam.  - Continue Entresto 24/26 bid.  BMET today.   - Continue Lasix 40 mg daily.    - Continue spironolactone 25 mg daily. - He is on canagliflozin.    - I will arrange for echo.  2. CAD: Complicated disease, coronary angiography 10/20 showed patent SVG-RCA territory and patent LIMA-LAD.  The proximal LAD was occluded and there was 99% distal left main stenosis.  This left the ramus and LCx in jeopardy.  He was not a good candidate for protected PCI => cannot place Impella with mechanical aortic valve and peripheral vascular disease likely precludes a femoral IABP.  It was decided to try to manage him medically.  However, he returned with progressive chest pain episodes and NSTEMI, hs-TnI up to 935. S/p successful orbital atherectomy and stenting of the left main with a 4.0x15 mm Resolute Onyx DES 10/10/19. Rare atypical chest pain, no changes to pattern.    - Will not start him on ASA 81 as he has had GI bleeding x 2 when ASA was added to warfarin in the past.  - Continue warfarin for mechanical valve.    - Continue atorvastatin 80 daily + Repatha.  Check lipids today.  3. Mechanical aortic valve: Stable on 1/22 echo. Crisp mechanical sounds on exam. - Will not use ASA given GI bleeding on  combination of ASA and warfarin.  - Continue warfarin, goal INR 2.5-3.5 with ongoing episodes concerning for TIAs.  4. NSVT: Noted 10/20 admission. 5. H/o GI bleeding: Continue PPI for GI protection. 6. PAD: Patient with aorto-bifemoral bypass, h/o iliac stenting, h/o femoral endarterectomy, PCI to right SFA in 2017.  Angiography in 10/20 with 80% ISR in right SFA.  He had PCI to right SFA in 3/21, no claudication now.  Repeat ABIs in 1/22 with mild left SFA disease. Peripheral arterial dopplers in 2/23 showed 50-99% in-stent restenosis in the right SFA stent.  Minimal claudication.  - Medical management for now per Dr. Gwenlyn Found.  7. Carotid/subclavian stenosis: Mild disease on dopplers in 4/22.  - Followed at [facility].  8. DM2: He is on SGLT2 inhibitor (canagliflozin).    9. Atrial fibrillation/atypical flutter:  TEE-guided DCCV in 11/20, atrial fibrillation ablation in 12/21.  Admitted with CHF exacerbation in 1/22 in the setting of recurrence of atrial fibrillation.  Atypical flutter with DCCV in 1/22. He is in NSR today.  He is off amiodarone.  - Continue coumadin. CBC today.  10. TIAs: Patient has had multiple episodes, most recently about 2 wks ago.  Saw neurology, has been noted to have severe vertebral artery disease.  Plan for referral to interventional neuroradiology for angiography/possible intervention but he does not yet have an appointment.  - Will message neurology to see if he has been referred.  - Continue statin/Repatha.  - With TIAs, increase INR goal to 2.5-3.5 for mechanical valve.  Off ASA with GI bleeding.   Loralie Champagne  08/31/2022

## 2022-08-31 NOTE — Telephone Encounter (Signed)
Received following message:  Jerl Mina, RN  P [facility] Anticoag Dr. Aundra Dubin changing his INR goal to 2.5-3.5   Pt has appt scheduled with anticoagulation clinic on Monday, 09/07/22. Note placed on appt note to change goal at appt.

## 2022-08-31 NOTE — Patient Instructions (Signed)
There has been no changes to your medications.   Labs done today, your results will be available in MyChart, we will contact you for abnormal readings.  Your physician has requested that you have an echocardiogram. Echocardiography is a painless test that uses sound waves to create images of your heart. It provides your doctor with information about the size and shape of your heart and how well your heart's chambers and valves are working. This procedure takes approximately one hour. There are no restrictions for this procedure.  Your physician recommends that you schedule a follow-up appointment in: 3 months  If you have any questions or concerns before your next appointment please send [place] a message through [place] or call our office at [phone].    TO LEAVE A MESSAGE FOR THE NURSE SELECT OPTION 2, PLEASE LEAVE A MESSAGE INCLUDING: YOUR NAME DATE OF BIRTH CALL BACK NUMBER REASON FOR CALL**this is important as we prioritize the call backs  YOU WILL RECEIVE A CALL BACK THE SAME DAY AS LONG AS YOU CALL BEFORE 4:00 PM  At the [facility], you and your health needs are our priority. As part of our continuing mission to provide you with exceptional heart care, we have created designated Provider Care Teams. These Care Teams include your primary Cardiologist (physician) and Advanced Practice Providers (APPs- Physician Assistants and Nurse Practitioners) who all work together to provide you with the care you need, when you need it.   You may see any of the following providers on your designated Care Team at your next follow up: Dr Glori Bickers Dr Loralie Champagne Dr. Roxana Hires, NP Lyda Jester, [place] [facility] [place], [place] Forestine Na, NP Audry Riles, PharmD   Please be sure to bring in all your medications bottles to every appointment.

## 2022-09-07 ENCOUNTER — Ambulatory Visit: Payer: Medicare Other | Attending: Cardiovascular Disease

## 2022-09-07 DIAGNOSIS — I359 Nonrheumatic aortic valve disorder, unspecified: Secondary | ICD-10-CM | POA: Diagnosis not present

## 2022-09-07 DIAGNOSIS — Z5181 Encounter for therapeutic drug level monitoring: Secondary | ICD-10-CM

## 2022-09-07 LAB — POCT INR: INR: 1.9 — AB (ref 2.0–3.0)

## 2022-09-07 NOTE — Patient Instructions (Addendum)
Description   Take 2 tablets of warfarin today and then START taking 1.5 tablets daily EXCEPT 2 tablets on Saturdays.   Recheck INR 2 weeks.  Stay consistent with greens  Coumadin Clinic for any changes in medications or upcoming procedures. [phone]

## 2022-09-22 ENCOUNTER — Ambulatory Visit: Payer: Medicare Other | Attending: Cardiovascular Disease | Admitting: *Deleted

## 2022-09-22 DIAGNOSIS — I359 Nonrheumatic aortic valve disorder, unspecified: Secondary | ICD-10-CM | POA: Diagnosis not present

## 2022-09-22 DIAGNOSIS — Z5181 Encounter for therapeutic drug level monitoring: Secondary | ICD-10-CM

## 2022-09-22 LAB — POCT INR: INR: 2.7 (ref 2.0–3.0)

## 2022-09-22 NOTE — Patient Instructions (Signed)
Description   Continue taking 1.5 tablets daily EXCEPT 2 tablets on Saturdays. Recheck INR 3 weeks.  Stay consistent with greens. Coumadin Clinic for any changes in medications or upcoming procedures. [phone]

## 2022-09-29 DIAGNOSIS — Z85828 Personal history of other malignant neoplasm of skin: Secondary | ICD-10-CM | POA: Diagnosis not present

## 2022-09-29 DIAGNOSIS — L570 Actinic keratosis: Secondary | ICD-10-CM | POA: Diagnosis not present

## 2022-09-29 DIAGNOSIS — L578 Other skin changes due to chronic exposure to nonionizing radiation: Secondary | ICD-10-CM | POA: Diagnosis not present

## 2022-09-30 ENCOUNTER — Encounter: Payer: Self-pay | Admitting: Pulmonary Disease

## 2022-09-30 ENCOUNTER — Ambulatory Visit (INDEPENDENT_AMBULATORY_CARE_PROVIDER_SITE_OTHER): Payer: Medicare Other | Admitting: Pulmonary Disease

## 2022-09-30 VITALS — BP 120/70 | HR 87 | Ht 68.0 in | Wt 166.6 lb

## 2022-09-30 DIAGNOSIS — Z87891 Personal history of nicotine dependence: Secondary | ICD-10-CM | POA: Diagnosis not present

## 2022-09-30 DIAGNOSIS — R911 Solitary pulmonary nodule: Secondary | ICD-10-CM | POA: Diagnosis not present

## 2022-09-30 DIAGNOSIS — C3410 Malignant neoplasm of upper lobe, unspecified bronchus or lung: Secondary | ICD-10-CM | POA: Diagnosis not present

## 2022-09-30 DIAGNOSIS — C3492 Malignant neoplasm of unspecified part of left bronchus or lung: Secondary | ICD-10-CM | POA: Diagnosis not present

## 2022-09-30 DIAGNOSIS — J432 Centrilobular emphysema: Secondary | ICD-10-CM

## 2022-09-30 NOTE — Patient Instructions (Signed)
Thank you for visiting Dr. Valeta Harms at [facility]. Today we recommend the following:  Call us if anything changes   Return if symptoms worsen or fail to improve. See [place] as needed in the future.     Please do your part to reduce the spread of COVID-19.

## 2022-09-30 NOTE — Progress Notes (Signed)
Synopsis: Referred in January 2021 for PET avid lung nodule by Shon Baton, MD  Subjective:   PATIENT ID: [name] [name] GENDER: male DOB: [birth year], MRN: [id number]  Chief Complaint  Patient presents with   Follow-up    This is a 76 year old gentleman past medical history of aortic stenosis, mechanical Saint Jude AVR on Coumadin, heart failure, COPD, history of coronary artery disease status post CABG, ITP, peripheral arterial disease, TIA, type 2 diabetes.Patient seen today in the office to discuss CT scan results.  Initial CT was completed at the beginning of January which revealed a 1.5 cm lingular nodule.  Patient had a PET scan completed which revealed an SUV max of 2.311 x 16 mm lingular nodule concerning for primary bronchogenic carcinoma.  Patient has a significant cardiac history.  Last week was taken to endoscopy for cardioversion.  By Dr. Aundra Dubin.  Currently anticoagulated with Coumadin.  History of AVR.  He has no complaints today from a respiratory standpoint.  Former smoker.  Denies hemoptysis weight loss fatigue.  OV 02/19/2021: Here today for follow-up after bronchoscopy.  Cytology from bronchoscopy was positive for a squamous cell carcinoma.  Patient was referred to radiation oncology.  Met with Dr. Sofie Hartigan on 02/06/2021.  Office notes reviewed today.  Patient was planned for SBRT for stage Ia 2 lung cancer.  Patient had his first treatment yesterday.  He did well with this.  Plan for 3 treatments of SBRT with follow-up.  From a respiratory standpoint patient is doing well with no complaints today.  Denies fevers chills night sweats weight loss and hemoptysis.  OV 09/30/2022: Patient here today for follow-up.  He is currently managed by radiation oncology.  He has a serial images completed there.  He is not on any inhalers or medications at this time for breathing.  He has no respiratory complaints today.  This is a 1 year return follow-up.    Past Medical History:   Diagnosis Date   Adenomatous colon polyp 09/1997   Anemia    Aortic stenosis    s/p st. jude mechanical AVR - Chronic Coumadin   Blood transfusion    "related to ITP"   CHF (congestive heart failure) (HCC)    COPD (chronic obstructive pulmonary disease) ([place])    patient denies this dx on 01/27/21   Coronary artery disease    s/p cabg x 3 11/2003: lima-lad, seq vg to rpda and rpl   Diverticulitis of colon    Dysrhythmia    a-fib   GERD (gastroesophageal reflux disease)    Heart murmur    History of radiation therapy 02/18/2021, 02/20/2021, 02/25/2021   SBRT to left lung     Dr Gery Pray   Hyperlipidemia    Hypertension    ITP (idiopathic thrombocytopenic purpura)    Lung cancer ([place])    last week   Peripheral arterial disease ([place])    a. history of aortobifemoral bypass grafting by Dr. Sherren Mocha early b. LE angiography 04/22/2015 patent aortobifem graft, DES to R SFA   Peripheral vascular disease ([place])    s/p Left external Iliac Artery stenting and subsequent left femoral endarterectomy 02/2011- post op course complicated by wound infxn req I&D 03/2011   Renal artery stenosis, native, bilateral ([place])    a. bilateral renal artery stenosis by recent duplex ultrasound b. L renal artery stent 02/2015, R renal artery patent on angiogram   Stroke [facility]) 2015   TIA (transient ischemic attack) ~ 2013   Type II diabetes  mellitus ([place])      Family History  Problem Relation Age of Onset   Coronary artery disease Mother        bypass surgery - deceased   Heart disease Father        murmur, valve replacement - deceased   Breast cancer Sister    Diabetes Other        grandmother   Diabetes Paternal Grandmother    Diabetes Paternal Aunt    Colon cancer Neg Hx    Colon polyps Neg Hx    Esophageal cancer Neg Hx    Rectal cancer Neg Hx    Stomach cancer Neg Hx      Past Surgical History:  Procedure Laterality Date   ABDOMINAL AORTAGRAM N/A 12/16/2011   Procedure: ABDOMINAL Maxcine Ham;   Surgeon: Sherren Mocha, MD;  Location: [facility] CATH LAB;  Service: Cardiovascular;  Laterality: N/A;   ABDOMINAL AORTOGRAM W/LOWER EXTREMITY Right 02/29/2020   Procedure: ABDOMINAL AORTOGRAM W/LOWER EXTREMITY;  Surgeon: Lorretta Harp, MD;  Location: [place] CV LAB;  Service: Cardiovascular;  Laterality: Right;   ANGIOPLASTY / STENTING ILIAC     Left external Iliac Artery   AORTA - BILATERAL FEMORAL ARTERY BYPASS GRAFT  01/18/2012   Procedure: AORTA BIFEMORAL BYPASS GRAFT;  Surgeon: Curt Jews, MD;  Location: [place];  Service: Vascular;  Laterality: N/A;   AORTIC VALVE REPLACEMENT  ~ 2004   ATRIAL FIBRILLATION ABLATION N/A 11/20/2020   Procedure: ATRIAL FIBRILLATION ABLATION;  Surgeon: Constance Haw, MD;  Location: [place] CV LAB;  Service: Cardiovascular;  Laterality: N/A;   BRONCHIAL BIOPSY  01/28/2021   Procedure: BRONCHIAL BIOPSIES;  Surgeon: Garner Nash, DO;  Location: [place] ENDOSCOPY;  Service: Pulmonary;;   BRONCHIAL BRUSHINGS  01/28/2021   Procedure: BRONCHIAL BRUSHINGS;  Surgeon: Garner Nash, DO;  Location: [place];  Service: Pulmonary;;   BRONCHIAL NEEDLE ASPIRATION BIOPSY  01/28/2021   Procedure: BRONCHIAL NEEDLE ASPIRATION BIOPSIES;  Surgeon: Garner Nash, DO;  Location: [place] ENDOSCOPY;  Service: Pulmonary;;   BRONCHIAL WASHINGS  01/28/2021   Procedure: BRONCHIAL WASHINGS;  Surgeon: Garner Nash, DO;  Location: [place];  Service: Pulmonary;;   CARDIAC CATHETERIZATION  11/2003   /pt report 10/01/2016   CARDIAC VALVE REPLACEMENT  11/2003   aortic   CARDIOVERSION N/A 10/16/2019   Procedure: CARDIOVERSION;  Surgeon: Larey Dresser, MD;  Location: [place];  Service: Cardiovascular;  Laterality: N/A;   CARDIOVERSION N/A 01/01/2021   Procedure: CARDIOVERSION;  Surgeon: Larey Dresser, MD;  Location: [place];  Service: Cardiovascular;  Laterality: N/A;   CATARACT EXTRACTION W/ INTRAOCULAR LENS  IMPLANT, BILATERAL Bilateral    COLONOSCOPY      COLONOSCOPY WITH PROPOFOL N/A 09/03/2019   Procedure: COLONOSCOPY WITH PROPOFOL;  Surgeon: Doran Stabler, MD;  Location: [facility] ENDOSCOPY;  Service: Gastroenterology;  Laterality: N/A;   CORONARY ARTERY BYPASS GRAFT  11/2003   Archie Endo 04/21/2011   CORONARY ATHERECTOMY N/A 10/10/2019   Procedure: CORONARY ATHERECTOMY;  Surgeon: Sherren Mocha, MD;  Location: [place] CV LAB;  Service: Cardiovascular;  Laterality: N/A;   CORONARY STENT INTERVENTION N/A 10/10/2019   Procedure: CORONARY STENT INTERVENTION;  Surgeon: Sherren Mocha, MD;  Location: [place] CV LAB;  Service: Cardiovascular;  Laterality: N/A;   CORONARY/GRAFT ANGIOGRAPHY N/A 09/18/2019   Procedure: CORONARY/GRAFT ANGIOGRAPHY;  Surgeon: Lorretta Harp, MD;  Location: [place] CV LAB;  Service: Cardiovascular;  Laterality: N/A;   ESOPHAGOGASTRODUODENOSCOPY (EGD) WITH PROPOFOL N/A 02/27/2019  Procedure: ESOPHAGOGASTRODUODENOSCOPY (EGD) WITH PROPOFOL;  Surgeon: Doran Stabler, MD;  Location: [place];  Service: Endoscopy;  Laterality: N/A;   FIDUCIAL MARKER PLACEMENT  01/28/2021   Procedure: FIDUCIAL MARKER PLACEMENT;  Surgeon: Garner Nash, DO;  Location: [place];  Service: Pulmonary;;   HEMOSTASIS CLIP PLACEMENT  09/03/2019   Procedure: HEMOSTASIS CLIP PLACEMENT;  Surgeon: Doran Stabler, MD;  Location: [facility];  Service: Gastroenterology;;   LOWER EXTREMITY ANGIOGRAM N/A 02/21/2015   Procedure: LOWER EXTREMITY ANGIOGRAM;  Surgeon: Lorretta Harp, MD;  Location: [facility] CATH LAB;  Service: Cardiovascular;  Laterality: N/A;   PERIPHERAL VASCULAR CATHETERIZATION N/A 04/22/2015   Procedure: Lower Extremity Angiography;  Surgeon: Lorretta Harp, MD;  Location: [place] CV LAB;  Service: Cardiovascular;  Laterality: N/A;   PERIPHERAL VASCULAR CATHETERIZATION N/A 08/24/2016   Procedure: Lower Extremity Angiography;  Surgeon: Lorretta Harp, MD;  Location: [place] CV LAB;  Service: Cardiovascular;   Laterality: N/A;   PERIPHERAL VASCULAR CATHETERIZATION Right 10/01/2016   Procedure: Peripheral Vascular Intervention - STENT;  Surgeon: Lorretta Harp, MD;  Location: [place] CV LAB;  Service: Cardiovascular;  Laterality: Right;  Prox and MID SFA    PERIPHERAL VASCULAR INTERVENTION  02/29/2020   Procedure: PERIPHERAL VASCULAR INTERVENTION;  Surgeon: Lorretta Harp, MD;  Location: [place] CV LAB;  Service: Cardiovascular;;  Right SFA   POLYPECTOMY     RENAL ANGIOGRAM N/A 02/21/2015   Procedure: RENAL ANGIOGRAM;  Surgeon: Lorretta Harp, MD;  Location: [facility] CATH LAB;  Service: Cardiovascular;  Laterality: Bilateral; 6 mm x 12 mm long Herculink balloon expandable stent to the left renal artery   RENAL ARTERY STENT Left 04/22/2015   dr berry   SPLENECTOMY  02/2003   Archie Endo 04/21/2011   TEE WITHOUT CARDIOVERSION N/A 10/16/2019   Procedure: TRANSESOPHAGEAL ECHOCARDIOGRAM (TEE);  Surgeon: Larey Dresser, MD;  Location: [facility] ENDOSCOPY;  Service: Cardiovascular;  Laterality: N/A;   TEMPORARY PACEMAKER N/A 10/10/2019   Procedure: TEMPORARY PACEMAKER;  Surgeon: Sherren Mocha, MD;  Location: [place] CV LAB;  Service: Cardiovascular;  Laterality: N/A;   TONSILLECTOMY  ~ [place] N/A 01/28/2021   Procedure: VIDEO BRONCHOSCOPY WITH ENDOBRONCHIAL NAVIGATION;  Surgeon: Garner Nash, DO;  Location: [place];  Service: Pulmonary;  Laterality: N/A;    Social History   Socioeconomic History   Marital status: Divorced    Spouse name: Not on file   Number of children: 3   Years of education: Not on file   Highest education level: Not on file  Occupational History   Occupation: Retired  Tobacco Use   Smoking status: Former    Years: 30.00    Types: Cigarettes, Cigars    Quit date: 12/15/1993    Years since quitting: 28.8   Smokeless tobacco: Never   Tobacco comments:    occasional cigar  Vaping Use   Vaping Use: Never used   Substance and Sexual Activity   Alcohol use: Yes    Alcohol/week: 16.0 standard drinks of alcohol    Types: 1 Cans of beer, 1 Shots of liquor, 14 Standard drinks or equivalent per week    Comment: drinks 2 martini's a night (2 shots in each)   Drug use: No   Sexual activity: Yes  Other Topics Concern   Not on file  Social History Narrative   Tries to remain active.  Frequent golfer but claudication limits this.   Social Determinants  of Health   Financial Resource Strain: Not on file  Food Insecurity: Not on file  Transportation Needs: Not on file  Physical Activity: Not on file  Stress: Not on file  Social Connections: Not on file  Intimate Partner Violence: Not on file     No Known Allergies   Outpatient Medications Prior to Visit  Medication Sig Dispense Refill   atorvastatin (LIPITOR) 80 MG tablet Take 80 mg by mouth daily.     Blood Glucose Monitoring Suppl (ONETOUCH VERIO IQ SYSTEM) w/Device KIT      enoxaparin (LOVENOX) 80 MG/0.8ML injection Inject 0.8 mLs (80 mg total) into the skin every 12 (twelve) hours. As instructed by Coumadin Clinic (Patient not taking: Reported on 07/13/2022) 8 mL 0   ENTRESTO 24-26 MG TAKE 1 TABLET BY MOUTH TWICE A DAY 60 tablet 6   Evolocumab 140 MG/ML SOAJ Inject 140 mg into the skin every 14 (fourteen) days.     furosemide (LASIX) 20 MG tablet TAKE 2 TABLETS (40 MG TOTAL) BY MOUTH DAILY. 180 tablet 3   glucose blood (ONETOUCH VERIO) test strip      glyBURIDE (DIABETA) 2.5 MG tablet Take 5 mg by mouth 2 (two) times daily with a meal.     INVOKANA 100 MG TABS tablet Take 100 mg by mouth daily.     levETIRAcetam (KEPPRA) 500 MG tablet Take 1 tablet (500 mg total) by mouth 2 (two) times daily. (Patient not taking: Reported on 07/13/2022) 60 tablet 11   nitroGLYCERIN (NITROSTAT) 0.4 MG SL tablet Place 1 tablet (0.4 mg total) under the tongue every 5 (five) minutes x 3 doses as needed for chest pain. 25 tablet 12   OneTouch Delica Lancets 01B MISC       pantoprazole (PROTONIX) 40 MG tablet TAKE 1 TABLET BY MOUTH EVERY DAY 90 tablet 3   sildenafil (VIAGRA) 100 MG tablet Take 100 mg by mouth as needed. (Patient not taking: Reported on 07/13/2022)     spironolactone (ALDACTONE) 25 MG tablet TAKE 1 TABLET BY MOUTH EVERY DAY 90 tablet 3   warfarin (COUMADIN) 5 MG tablet TAKE 1 TO 1 AND 1/2 TABLETS BY MOUTH DAILY AS PRESCRIBED BY THE COUMADIN CLINIC. 120 tablet 1   No facility-administered medications prior to visit.    Review of Systems  Constitutional:  Negative for chills, fever, malaise/fatigue and weight loss.  HENT:  Negative for hearing loss, sore throat and tinnitus.   Eyes:  Negative for blurred vision and double vision.  Respiratory:  Negative for cough, hemoptysis, sputum production, shortness of breath, wheezing and stridor.   Cardiovascular:  Negative for chest pain, palpitations, orthopnea, leg swelling and PND.  Gastrointestinal:  Negative for abdominal pain, constipation, diarrhea, heartburn, nausea and vomiting.  Genitourinary:  Negative for dysuria, hematuria and urgency.  Musculoskeletal:  Negative for joint pain and myalgias.  Skin:  Negative for itching and rash.  Neurological:  Negative for dizziness, tingling, weakness and headaches.  Endo/Heme/Allergies:  Negative for environmental allergies. Does not bruise/bleed easily.  Psychiatric/Behavioral:  Negative for depression. The patient is not nervous/anxious and does not have insomnia.   All other systems reviewed and are negative.    Objective:  Physical Exam Vitals reviewed.  Constitutional:      General: He is not in acute distress.    Appearance: He is well-developed.  HENT:     Head: Normocephalic and atraumatic.  Eyes:     General: No scleral icterus.    Conjunctiva/sclera: Conjunctivae normal.  Pupils: Pupils are equal, round, and reactive to light.  Neck:     Vascular: No JVD.     Trachea: No tracheal deviation.  Cardiovascular:     Rate and  Rhythm: Normal rate and regular rhythm.     Heart sounds: Normal heart sounds. No murmur heard. Pulmonary:     Effort: Pulmonary effort is normal. No tachypnea, accessory muscle usage or respiratory distress.     Breath sounds: No stridor. No wheezing, rhonchi or rales.  Abdominal:     General: Bowel sounds are normal. There is no distension.     Palpations: Abdomen is soft.     Tenderness: There is no abdominal tenderness.  Musculoskeletal:        General: No tenderness.     Cervical back: Neck supple.  Lymphadenopathy:     Cervical: No cervical adenopathy.  Skin:    General: Skin is warm and dry.     Capillary Refill: Capillary refill takes less than 2 seconds.     Findings: No rash.  Neurological:     Mental Status: He is alert and oriented to person, place, and time.  Psychiatric:        Behavior: Behavior normal.      Vitals:   09/30/22 1305  BP: 120/70  Pulse: 87  SpO2: 93%  Weight: 166 lb 9.6 oz (75.6 kg)  Height: $Remove'5\' 8"'McCAlTG$  (1.727 m)   93% on  RA BMI Readings from Last 3 Encounters:  09/30/22 25.33 kg/m  08/31/22 25.54 kg/m  07/13/22 26.02 kg/m   Wt Readings from Last 3 Encounters:  09/30/22 166 lb 9.6 oz (75.6 kg)  08/31/22 168 lb (76.2 kg)  07/13/22 171 lb 2 oz (77.6 kg)     CBC    Component Value Date/Time   WBC 14.1 (H) 08/31/2022 1027   RBC 5.68 08/31/2022 1027   HGB 17.5 (H) 08/31/2022 1027   HGB 15.9 11/04/2020 1529   HCT 51.7 08/31/2022 1027   HCT 47.4 11/04/2020 1529   PLT 214 08/31/2022 1027   PLT 216 11/04/2020 1529   MCV 91.0 08/31/2022 1027   MCV 95 11/04/2020 1529   MCH 30.8 08/31/2022 1027   MCHC 33.8 08/31/2022 1027   RDW 14.2 08/31/2022 1027   RDW 13.3 11/04/2020 1529   LYMPHSABS 2.0 12/07/2020 2356   MONOABS 1.4 (H) 12/07/2020 2356   EOSABS 0.1 12/07/2020 2356   BASOSABS 0.1 12/07/2020 2356    Chest Imaging: 12/08/2020 CT chest: 11 x 16 mm lingular lung nodule concerning for primary bronchogenic carcinoma with lobulated  edges. The patient's images have been independently reviewed by me.    12/13/2020 nuclear medicine PET/CT: A 15 mm lingular nodule SUV max 2.3 concerning for primary bronchogenic carcinoma. The patient's images have been independently reviewed by me.    Pulmonary Functions Testing Results:     No data to display          FeNO:   Pathology:   Echocardiogram:   Heart Catheterization:     Assessment & Plan:     ICD-10-CM   1. Nodule of upper lobe of left lung  R91.1     2. Cancer of lingula of lung (HCC)  C34.10     3. Squamous cell carcinoma of left lung (HCC)  C34.92     4. Centrilobular emphysema ([place])  J43.2     5. Former smoker  Z87.891        Discussion:  This is a 76 year old gentleman, severe coronary  disease history of AVR on Coumadin, heart failure, Entresto.  Patient underwent navigational bronchoscopy to his lingular lesion diagnosed with a stage I primary bronchogenic carcinoma.  Plan: Patient has now approximately a year and a half out from his lung cancer diagnosis. He is getting his every 6 month CT imaging followed by radiation oncology. He follows with Dr. Sondra Come.  From respiratory standpoint he is doing well he does have some emphysema on his CT imaging. He has no respiratory complaints at this time.  We talked about inhaler use.  He is not interested in doing that at this time. He will let us know if he has any respiratory changes continue to follow with primary care provider, Dr. Virgina Jock.  Patient can return to see [place] as needed    Current Outpatient Medications:    atorvastatin (LIPITOR) 80 MG tablet, Take 80 mg by mouth daily., Disp: , Rfl:    Blood Glucose Monitoring Suppl (ONETOUCH VERIO IQ SYSTEM) w/Device KIT, , Disp: , Rfl:    enoxaparin (LOVENOX) 80 MG/0.8ML injection, Inject 0.8 mLs (80 mg total) into the skin every 12 (twelve) hours. As instructed by Coumadin Clinic (Patient not taking: Reported on 07/13/2022), Disp: 8 mL, Rfl: 0    ENTRESTO 24-26 MG, TAKE 1 TABLET BY MOUTH TWICE A DAY, Disp: 60 tablet, Rfl: 6   Evolocumab 140 MG/ML SOAJ, Inject 140 mg into the skin every 14 (fourteen) days., Disp: , Rfl:    furosemide (LASIX) 20 MG tablet, TAKE 2 TABLETS (40 MG TOTAL) BY MOUTH DAILY., Disp: 180 tablet, Rfl: 3   glucose blood (ONETOUCH VERIO) test strip, , Disp: , Rfl:    glyBURIDE (DIABETA) 2.5 MG tablet, Take 5 mg by mouth 2 (two) times daily with a meal., Disp: , Rfl:    INVOKANA 100 MG TABS tablet, Take 100 mg by mouth daily., Disp: , Rfl:    levETIRAcetam (KEPPRA) 500 MG tablet, Take 1 tablet (500 mg total) by mouth 2 (two) times daily. (Patient not taking: Reported on 07/13/2022), Disp: 60 tablet, Rfl: 11   nitroGLYCERIN (NITROSTAT) 0.4 MG SL tablet, Place 1 tablet (0.4 mg total) under the tongue every 5 (five) minutes x 3 doses as needed for chest pain., Disp: 25 tablet, Rfl: 12   OneTouch Delica Lancets 04N MISC, , Disp: , Rfl:    pantoprazole (PROTONIX) 40 MG tablet, TAKE 1 TABLET BY MOUTH EVERY DAY, Disp: 90 tablet, Rfl: 3   sildenafil (VIAGRA) 100 MG tablet, Take 100 mg by mouth as needed. (Patient not taking: Reported on 07/13/2022), Disp: , Rfl:    spironolactone (ALDACTONE) 25 MG tablet, TAKE 1 TABLET BY MOUTH EVERY DAY, Disp: 90 tablet, Rfl: 3   warfarin (COUMADIN) 5 MG tablet, TAKE 1 TO 1 AND 1/2 TABLETS BY MOUTH DAILY AS PRESCRIBED BY THE COUMADIN CLINIC., Disp: 120 tablet, Rfl: 1   Garner Nash, DO [place] [facility] 09/30/2022 1:25 PM

## 2022-10-02 ENCOUNTER — Ambulatory Visit: Payer: Medicare Other | Admitting: Podiatry

## 2022-10-07 ENCOUNTER — Telehealth: Payer: Self-pay | Admitting: Neurology

## 2022-10-07 NOTE — Telephone Encounter (Signed)
LVM and sent mychart msg informing pt of need to reschedule 11/13 appointment - MD out

## 2022-10-13 ENCOUNTER — Ambulatory Visit: Payer: Medicare Other | Attending: Internal Medicine

## 2022-10-13 DIAGNOSIS — I359 Nonrheumatic aortic valve disorder, unspecified: Secondary | ICD-10-CM

## 2022-10-13 DIAGNOSIS — Z5181 Encounter for therapeutic drug level monitoring: Secondary | ICD-10-CM

## 2022-10-13 LAB — POCT INR: INR: 2.3 (ref 2.0–3.0)

## 2022-10-13 NOTE — Patient Instructions (Signed)
TAKE 2 TABLETS WEDNESDAY  and then Continue taking 1.5 tablets daily EXCEPT 2 tablets on Saturdays. Recheck INR 4 weeks.  Stay consistent with greens. Coumadin Clinic for any changes in medications or upcoming procedures. [phone]

## 2022-10-19 ENCOUNTER — Ambulatory Visit: Payer: Medicare Other | Admitting: Neurology

## 2022-11-02 DIAGNOSIS — E1151 Type 2 diabetes mellitus with diabetic peripheral angiopathy without gangrene: Secondary | ICD-10-CM | POA: Diagnosis not present

## 2022-11-02 DIAGNOSIS — I739 Peripheral vascular disease, unspecified: Secondary | ICD-10-CM | POA: Diagnosis not present

## 2022-11-02 DIAGNOSIS — I4819 Other persistent atrial fibrillation: Secondary | ICD-10-CM | POA: Diagnosis not present

## 2022-11-02 DIAGNOSIS — G459 Transient cerebral ischemic attack, unspecified: Secondary | ICD-10-CM | POA: Diagnosis not present

## 2022-11-02 DIAGNOSIS — I6523 Occlusion and stenosis of bilateral carotid arteries: Secondary | ICD-10-CM | POA: Diagnosis not present

## 2022-11-02 DIAGNOSIS — E1122 Type 2 diabetes mellitus with diabetic chronic kidney disease: Secondary | ICD-10-CM | POA: Diagnosis not present

## 2022-11-02 DIAGNOSIS — I6990 Unspecified sequelae of unspecified cerebrovascular disease: Secondary | ICD-10-CM | POA: Diagnosis not present

## 2022-11-02 DIAGNOSIS — I5032 Chronic diastolic (congestive) heart failure: Secondary | ICD-10-CM | POA: Diagnosis not present

## 2022-11-02 DIAGNOSIS — I1310 Hypertensive heart and chronic kidney disease without heart failure, with stage 1 through stage 4 chronic kidney disease, or unspecified chronic kidney disease: Secondary | ICD-10-CM | POA: Diagnosis not present

## 2022-11-02 DIAGNOSIS — R4189 Other symptoms and signs involving cognitive functions and awareness: Secondary | ICD-10-CM | POA: Diagnosis not present

## 2022-11-02 DIAGNOSIS — N1831 Chronic kidney disease, stage 3a: Secondary | ICD-10-CM | POA: Diagnosis not present

## 2022-11-02 DIAGNOSIS — I701 Atherosclerosis of renal artery: Secondary | ICD-10-CM | POA: Diagnosis not present

## 2022-11-09 ENCOUNTER — Ambulatory Visit: Payer: Medicare Other | Attending: Cardiovascular Disease

## 2022-11-09 DIAGNOSIS — I359 Nonrheumatic aortic valve disorder, unspecified: Secondary | ICD-10-CM | POA: Diagnosis not present

## 2022-11-09 DIAGNOSIS — Z5181 Encounter for therapeutic drug level monitoring: Secondary | ICD-10-CM

## 2022-11-09 LAB — POCT INR: INR: 2.7 (ref 2.0–3.0)

## 2022-11-09 NOTE — Patient Instructions (Signed)
Description   Continue taking 1.5 tablets daily EXCEPT 2 tablets on Saturdays. Recheck INR 5 weeks.  Stay consistent with greens. Coumadin Clinic for any changes in medications or upcoming procedures. [phone]

## 2022-11-10 ENCOUNTER — Encounter: Payer: Self-pay | Admitting: Podiatry

## 2022-11-10 ENCOUNTER — Ambulatory Visit (INDEPENDENT_AMBULATORY_CARE_PROVIDER_SITE_OTHER): Payer: Medicare Other | Admitting: Podiatry

## 2022-11-10 DIAGNOSIS — B351 Tinea unguium: Secondary | ICD-10-CM | POA: Diagnosis not present

## 2022-11-10 DIAGNOSIS — D689 Coagulation defect, unspecified: Secondary | ICD-10-CM | POA: Diagnosis not present

## 2022-11-10 DIAGNOSIS — I739 Peripheral vascular disease, unspecified: Secondary | ICD-10-CM

## 2022-11-10 NOTE — Progress Notes (Signed)
This patient returns to my office for at risk foot care.  This patient requires this care by a professional since this patient will be at risk due to having diabetes , chronic anticoagulation renal disease and PVD.  Patient is taking coumadin.  This patient is unable to cut nails himself since the patient cannot reach his nails.These nails are painful walking and wearing shoes.  This patient presents for at risk foot care today.  General Appearance  Alert, conversant and in no acute stress.  Vascular  Dorsalis pedis and posterior tibial  pulses are palpable  bilaterally.  Capillary return is within normal limits  bilaterally. Temperature is within normal limits  bilaterally.  Neurologic  Senn-Weinstein monofilament wire test within normal limits  bilaterally. Muscle power within normal limits bilaterally.  Nails Thick disfigured discolored nails with subungual debris  from hallux to fifth toes bilaterally. No evidence of bacterial infection or drainage bilaterally.  Orthopedic  No limitations of motion  feet .  No crepitus or effusions noted.  No bony pathology or digital deformities noted.  Skin  normotropic skin with no porokeratosis noted bilaterally.  No signs of infections or ulcers noted.     Onychomycosis  Pain in right toes  Pain in left toes  Consent was obtained for treatment procedures.   Mechanical debridement of nails 1-5  bilaterally performed with a nail nipper.  Filed with dremel without incident.    Return office visit    4 months                  Told patient to return for periodic foot care and evaluation due to potential at risk complications.   Gardiner Barefoot DPM

## 2022-12-01 ENCOUNTER — Ambulatory Visit (HOSPITAL_COMMUNITY): Admission: RE | Admit: 2022-12-01 | Payer: Medicare Other | Source: Ambulatory Visit

## 2022-12-01 ENCOUNTER — Ambulatory Visit (HOSPITAL_COMMUNITY)
Admission: RE | Admit: 2022-12-01 | Discharge: 2022-12-01 | Disposition: A | Discharge status: Home / Self Care | Payer: Medicare Other | Source: Ambulatory Visit | Attending: Adult Health | Admitting: Adult Health

## 2022-12-01 ENCOUNTER — Encounter (HOSPITAL_COMMUNITY): Payer: Self-pay

## 2022-12-01 VITALS — BP 102/64 | HR 84 | Ht 68.0 in | Wt 170.0 lb

## 2022-12-01 DIAGNOSIS — I472 Ventricular tachycardia, unspecified: Secondary | ICD-10-CM | POA: Insufficient documentation

## 2022-12-01 DIAGNOSIS — I2510 Atherosclerotic heart disease of native coronary artery without angina pectoris: Secondary | ICD-10-CM | POA: Diagnosis not present

## 2022-12-01 DIAGNOSIS — E119 Type 2 diabetes mellitus without complications: Secondary | ICD-10-CM | POA: Diagnosis not present

## 2022-12-01 DIAGNOSIS — I252 Old myocardial infarction: Secondary | ICD-10-CM | POA: Insufficient documentation

## 2022-12-01 DIAGNOSIS — I25810 Atherosclerosis of coronary artery bypass graft(s) without angina pectoris: Secondary | ICD-10-CM | POA: Diagnosis not present

## 2022-12-01 DIAGNOSIS — Z952 Presence of prosthetic heart valve: Secondary | ICD-10-CM | POA: Diagnosis not present

## 2022-12-01 DIAGNOSIS — I480 Paroxysmal atrial fibrillation: Secondary | ICD-10-CM | POA: Insufficient documentation

## 2022-12-01 DIAGNOSIS — E785 Hyperlipidemia, unspecified: Secondary | ICD-10-CM | POA: Insufficient documentation

## 2022-12-01 DIAGNOSIS — Z8673 Personal history of transient ischemic attack (TIA), and cerebral infarction without residual deficits: Secondary | ICD-10-CM | POA: Insufficient documentation

## 2022-12-01 DIAGNOSIS — I255 Ischemic cardiomyopathy: Secondary | ICD-10-CM | POA: Insufficient documentation

## 2022-12-01 DIAGNOSIS — I5032 Chronic diastolic (congestive) heart failure: Secondary | ICD-10-CM

## 2022-12-01 DIAGNOSIS — I5042 Chronic combined systolic (congestive) and diastolic (congestive) heart failure: Secondary | ICD-10-CM | POA: Diagnosis not present

## 2022-12-01 DIAGNOSIS — Z7901 Long term (current) use of anticoagulants: Secondary | ICD-10-CM | POA: Diagnosis not present

## 2022-12-01 DIAGNOSIS — I779 Disorder of arteries and arterioles, unspecified: Secondary | ICD-10-CM | POA: Insufficient documentation

## 2022-12-01 DIAGNOSIS — Z79899 Other long term (current) drug therapy: Secondary | ICD-10-CM | POA: Diagnosis not present

## 2022-12-01 DIAGNOSIS — I110 Hypertensive heart disease with heart failure: Secondary | ICD-10-CM | POA: Diagnosis not present

## 2022-12-01 DIAGNOSIS — Z955 Presence of coronary angioplasty implant and graft: Secondary | ICD-10-CM | POA: Diagnosis not present

## 2022-12-01 LAB — BASIC METABOLIC PANEL
Anion gap: 9 (ref 5–15)
BUN: 19 mg/dL (ref 8–23)
CO2: 32 mmol/L (ref 22–32)
Calcium: 9.3 mg/dL (ref 8.9–10.3)
Chloride: 99 mmol/L (ref 98–111)
Creatinine, Ser: 1.16 mg/dL (ref 0.61–1.24)
GFR, Estimated: >60 mL/min (ref >60)
Glucose, Bld: 233 mg/dL — ABNORMAL HIGH (ref 70–99)
Potassium: 4.1 mmol/L (ref 3.5–5.1)
Sodium: 140 mmol/L (ref 135–145)

## 2022-12-01 NOTE — Progress Notes (Signed)
PCP: Shon Baton, MD HF Cardiology: Dr. Jon Billings [name] [name] is a 76 y.o. male with a history of CAD s/p CABG, mechanical AVR on coumadin, extensive PAD, DM, HTN, HLD, ITP, TIA, and recurrent GI bleed. Note, requires lovenox bridging off heparin.   Dr. Gwenlyn Found follows him for extensive PAD. He has had an aortobifem bypass in 2016 with follow up iliac stenting and femoral endarterectomy. In 2017, he had subsequent ostial and mid right SFA intervention. Hx of left renal artery stenting in 2016 with repeat intervention on left for 75% in-stent restenosis, with progression of disease on the right renal artery showing 60% stenosis on duplex 07/12/19. Echo 09/17/19 with normal EF of 60-65% and normal AVR function; chronic diastolic heart failure.    Admitted in 10/20 with chest pain. He underwent heart cath 09/18/19 that showed 99% left main stenosis and patent LIMA to occluded LAD and a patent sequential vein to the PDA and PLA of an occluded dominant right. His left main stenosis jeopardized the moderately large ramus and high first marginal and nondominant Cx which has 90% mid AV groove Cx stenosis. Aggressive medical therapy was recommended initially.  He was readmitted in 10/20 with CHF and chest pain. Hospital course complicated by A flutter/low output heart failure.  S/P successful orbital atherectomy, PTCA, and stenting of protected left main with a 4.0x15 mm resolute onyx DES 11/3. LIMA-LAD patent. SVG-PDA patent. ECHO this admission showed EF 30-35%, mechanical AoV ok. TEE 11/9 w/ improved EF, 40-45%. Placed on milrinone initially with low co-ox and later weaned off.  He was cardioverted from atrial flutter back to NSR.   2/21 peripheral arterial dopplers showed 75-99% stenosis right SFA.  In 3/21, he had stenting to right SFA.    Echo in 6/21 showed EF up to 55-60%.    In 12/21, he had an atrial fibrillation ablation.   He was admitted in 1/22 with shortness of breath and was found to be  in atrial fibrillation with RVR. He was hypertensive.  Troponin was negative. He was diuresed and discharged, cardiology was not notified.  Echo during this admission showed EF 60-65%, normal RV size and systolic function, stable mechanical aortic valve.   During the 1/22 admission, CT chest showed a lung nodule.  Followup PET-CT suggestive of lung cancer, biopsy with squamous cell lung cancer.  He underwent radiation therapy, now completed.   DCCV to NSR in 1/22.   He has had episodes concerning for vertebrobasilar TIAs.  Significant disease noted in the vertebral arteries.  He was seen by neurology who wanted him referred to interventional neuroradiology for angiography with possible PCI to the vertebrals. However, he has not yet been scheduled for an appointment. Neurology follow up unfortunately was re scheduled until March so he decided he didn't want follow up.   Today he returns for HF follow up.Overall feeling fine. Having occasional pain in lower extremity but says it doesn't last. Remains active playing golf and [facility]. Denies SOB/PND/Orthopnea. Appetite ok. No fever or chills. Weight at home stable. Taking all medications   Labs (11/20): K 4.3, creatinine 1.3, hgb 12.5 Labs (12/20): LDL 56, HDL 80, TGs 179 Labs (1/21): K 4.8, creatinine 1.45 Labs (4/21): K 5.2, creatinine 1.34 Labs (6/21): LDL 44 Labs (1/22): K 4, creatinine 1.3 Labs (3/22): K 4.4, creatinine 1.02 Labs (3/23): K 4.4, creatinine 1.38  PMH  1. CAD:  S/P CABG (3/04) with LIMA-LAD, seq SVG-PDA/PLV.  - NSTEMI 10/20 with cath showing  patent LIMA-LAD and SVG-PDA/PLV but 99% LM stenosis, occluded LAD, moderate ramus and high OM1 jeopardized, also 90% mid AV groove LCx stenosis.  Occluded RCA.  Patient ultimately had orbital atherectomy and stenting of left main with a 4.0 x 15 mm Resolute Onyx DES.  2. Mechanical aortic valve: TEE 11/20 showed stable-appearing mechanical valve, mean gradient 9 mmHg.  3. PAD:  Aortobifemoral bypass in 2016 with follow up iliac stenting and femoral endarterectomy. In 2017, he had subsequent ostial and mid right SFA intervention. - Angiography 10/20 with 80% in-stent restenosis in proximal right SFA.  - Peripheral arterial dopplers (2/21) with 75-99% R SFA stenosis in prior stented area.  - 3/21 Right SFA stent (Dr. Gwenlyn Found).  - ABIs (1/22): Stable mild left SFA disease.  - Peripheral arterial dopplers (2/23): 50-99% proximal R SFA in-stent restenosis.  Medical management for now.  4. Type 2 diabetes.  5. HTN - Renal artery dopplers (8/21): No significant stenosis.  6. Hyperlipidemia 7. H/o ITP  8. TIAs  9. Chronic Systolic HF: Ischemic cardiomyopathy.  - Echo (10/20): EF 30-35%, mechanical AoV ok.  - TEE (11/20): w/ improved EF, 40-45%. Mechanical aortic valve with mean gradient 9 mmHg, normal RV.  - Echo (6/21): EF 55-60%, mild LVH, mechanical AoV with mean gradient 9 mmHg.  - Echo (1/22): EF 60-65%, normal RV size and systolic function, stable mechanical aortic valve.  10. Atrial fibrillation: Paroxysmal.  - Atrial fibrillation ablation 12/21.  - DCCV 1/22 11. Renal artery stenosis: Hx of left renal artery stenting in 2016 with repeat intervention on left for 75% in-stent restenosis, with progression of disease on the right renal artery showing 60% stenosis on duplex 07/12/19. - Renal artery dopplers (8/23): 1-59% bilateral RAS.  12. Carotid stenosis: 4/21 carotid dopplers with right subclavian stenosis, 40-59% RICA stenosis.  - Carotid dopplers (4/22): 1-39% BICA stenosis.  13. Squamous cell lung cancer: Treated with XRT.     ROS: All systems negative except as listed in HPI, PMH and Problem List.  Social History   Socioeconomic History   Marital status: Divorced    Spouse name: Not on file   Number of children: 3   Years of education: Not on file   Highest education level: Not on file  Occupational History   Occupation: Retired  Tobacco Use   Smoking  status: Former    Years: 30.00    Types: Cigarettes, Cigars    Quit date: 12/15/1993    Years since quitting: 28.9   Smokeless tobacco: Never   Tobacco comments:    occasional cigar  Vaping Use   Vaping Use: Never used  Substance and Sexual Activity   Alcohol use: Yes    Alcohol/week: 16.0 standard drinks of alcohol    Types: 1 Cans of beer, 1 Shots of liquor, 14 Standard drinks or equivalent per week    Comment: drinks 2 martini's a night (2 shots in each)   Drug use: No   Sexual activity: Yes  Other Topics Concern   Not on file  Social History Narrative   Tries to remain active.  Frequent golfer but claudication limits this.   Social Determinants of Health   Financial Resource Strain: Not on file  Food Insecurity: Not on file  Transportation Needs: Not on file  Physical Activity: Not on file  Stress: Not on file  Social Connections: Not on file  Intimate Partner Violence: Not on file   Family History  Problem Relation Age of Onset   Coronary  artery disease Mother        bypass surgery - deceased   Heart disease Father        murmur, valve replacement - deceased   Breast cancer Sister    Diabetes Other        grandmother   Diabetes Paternal Grandmother    Diabetes Paternal Aunt    Colon cancer Neg Hx    Colon polyps Neg Hx    Esophageal cancer Neg Hx    Rectal cancer Neg Hx    Stomach cancer Neg Hx    Current Outpatient Medications  Medication Sig Dispense Refill   atorvastatin (LIPITOR) 80 MG tablet Take 80 mg by mouth daily.     Blood Glucose Monitoring Suppl (ONETOUCH VERIO IQ SYSTEM) w/Device KIT      ENTRESTO 24-26 MG TAKE 1 TABLET BY MOUTH TWICE A DAY 60 tablet 6   Evolocumab 140 MG/ML SOAJ Inject 140 mg into the skin every 14 (fourteen) days.     furosemide (LASIX) 20 MG tablet TAKE 2 TABLETS (40 MG TOTAL) BY MOUTH DAILY. 180 tablet 3   glucose blood (ONETOUCH VERIO) test strip      glyBURIDE (DIABETA) 2.5 MG tablet Take 5 mg by mouth 2 (two) times daily  with a meal.     INVOKANA 100 MG TABS tablet Take 100 mg by mouth daily.     OneTouch Delica Lancets 34V MISC      pantoprazole (PROTONIX) 40 MG tablet TAKE 1 TABLET BY MOUTH EVERY DAY 90 tablet 3   sildenafil (VIAGRA) 100 MG tablet Take 100 mg by mouth as needed.     spironolactone (ALDACTONE) 25 MG tablet TAKE 1 TABLET BY MOUTH EVERY DAY 90 tablet 3   warfarin (COUMADIN) 5 MG tablet TAKE 1 TO 1 AND 1/2 TABLETS BY MOUTH DAILY AS PRESCRIBED BY THE COUMADIN CLINIC. 120 tablet 1   enoxaparin (LOVENOX) 80 MG/0.8ML injection Inject 0.8 mLs (80 mg total) into the skin every 12 (twelve) hours. As instructed by Coumadin Clinic (Patient not taking: Reported on 07/13/2022) 8 mL 0   levETIRAcetam (KEPPRA) 500 MG tablet Take 1 tablet (500 mg total) by mouth 2 (two) times daily. (Patient not taking: Reported on 07/13/2022) 60 tablet 11   nitroGLYCERIN (NITROSTAT) 0.4 MG SL tablet Place 1 tablet (0.4 mg total) under the tongue every 5 (five) minutes x 3 doses as needed for chest pain. (Patient not taking: Reported on 12/01/2022) 25 tablet 12   No current facility-administered medications for this encounter.   BP 102/64   Pulse 84   Ht _0  (1.727 m)   Wt 77.1 kg (170 lb)   SpO2 94%   BMI 25.85 kg/m   Wt Readings from Last 3 Encounters:  12/01/22 77.1 kg (170 lb)  09/30/22 75.6 kg (166 lb 9.6 oz)  08/31/22 76.2 kg (168 lb)   PHYSICAL EXAM: General:  Well appearing. No resp difficulty HEENT: normal Neck: supple. no JVD. Carotids 2+ bilat; no bruits. No lymphadenopathy or thryomegaly appreciated. Cor: PMI nondisplaced. Regular rate & rhythm. No rubs, gallops. Mechanical S2.  Lungs: clear Abdomen: soft, nontender, nondistended. No hepatosplenomegaly. No bruits or masses. Good bowel sounds. Extremities: no cyanosis, clubbing, rash, edema Neuro: alert & orientedx3, cranial nerves grossly intact. moves all 4 extremities w/o difficulty. Affect pleasant  ASSESSMENT & PLAN: 1. Chronic Systolic =>  Diastolic CHF: Ischemic cardiomyopathy.  Echo 10/06/19 showed EF 30-35% and wall motion abnormalities, prior echo earlier in 10/20 showed EF 50-55%.  Cath 10/20 showed jeopardized ramus and LCx territory (99% distal left main, occluded LAD, patent LIMA-LAD). Concerned that ischemia in this territory triggered CHF, worsening of LV function.  Now s/p DES to left main, TEE in 11/20 after intervention showed EF higher at 40-45%.  Echo in 1/22 showed EF 60-65% with normal RV. Missed Echo appointment. Rescheduled for January.   -NYHA II. Volume status stable. Continue lasix 40 mg daily  - Continue Entresto 24/26 bid.   - Continue spironolactone 25 mg daily. - He is on canagliflozin.    -Check BMET. 2. CAD: Complicated disease, coronary angiography 10/20 showed patent SVG-RCA territory and patent LIMA-LAD.  The proximal LAD was occluded and there was 99% distal left main stenosis.  This left the ramus and LCx in jeopardy.  He was not a good candidate for protected PCI => cannot place Impella with mechanical aortic valve and peripheral vascular disease likely precludes a femoral IABP.  It was decided to try to manage him medically.  However, he returned with progressive chest pain episodes and NSTEMI, hs-TnI up to 935. S/p successful orbital atherectomy and stenting of the left main with a 4.0x15 mm Resolute Onyx DES 10/10/19.     - no chest pain.  - Will not start him on ASA 81 as he has had GI bleeding x 2 when ASA was added to warfarin in the past.  - Continue warfarin for mechanical valve.    - Continue atorvastatin 80 daily + Repatha.  Recent lipid panel 08/31/22, stable.   3. Mechanical aortic valve: Stable on 1/22 echo. Crisp mechanical sounds on exam. - Will not use ASA given GI bleeding on combination of ASA and warfarin.  - Continue warfarin, goal INR 2.5-3.5 with ongoing episodes concerning for TIAs.  - Reschedule ECHO.  4. NSVT: Noted 10/20 admission. 5. H/o GI bleeding: Continue PPI for GI  protection. 6. PAD: Patient with aorto-bifemoral bypass, h/o iliac stenting, h/o femoral endarterectomy, PCI to right SFA in 2017.  Angiography in 10/20 with 80% ISR in right SFA.  He had PCI to right SFA in 3/21, no claudication now.  Repeat ABIs in 1/22 with mild left SFA disease. Peripheral arterial dopplers in 2/23 showed 50-99% in-stent restenosis in the right SFA stent.  Minimal claudication.  - Medical management for now per Dr. Gwenlyn Found.  7. Carotid/subclavian stenosis: Mild disease on dopplers in 4/22.  - Followed at [facility].  8. DM2: He is on SGLT2 inhibitor (canagliflozin).    9. Atrial fibrillation/atypical flutter:  TEE-guided DCCV in 11/20, atrial fibrillation ablation in 12/21.  Admitted with CHF exacerbation in 1/22 in the setting of recurrence of atrial fibrillation.  Atypical flutter with DCCV in 1/22.    He is off amiodarone.  - Continue coumadin.  10. TIAs:  Saw neurology, has been noted to have severe vertebral artery disease.  Plan for referral to interventional neuroradiology for angiography/possible intervention but he does not yet have an appointment.  -- Continue statin/Repatha.  - With TIAs,  INR goal to 2.5-3.5 for mechanical valve.  Off ASA with GI bleeding.   Follow up with Dr Aundra Dubin in 3 months.   Zena Vitelli  12/01/2022

## 2022-12-01 NOTE — Patient Instructions (Signed)
Medication Changes:  None, continue current medications  Lab Work:  Labs done today, your results will be available in MyChart, we will contact you for abnormal readings.   Testing/Procedures:  Your physician has requested that you have an echocardiogram. Echocardiography is a painless test that uses sound waves to create images of your heart. It provides your doctor with information about the size and shape of your heart and how well your heart's chambers and valves are working. This procedure takes approximately one hour. There are no restrictions for this procedure. Please do NOT wear cologne, perfume, aftershave, or lotions (deodorant is allowed). Please arrive 15 minutes prior to your appointment time.   Referrals:  none  Special Instructions // Education:  Do the following things EVERYDAY: Weigh yourself in the morning before breakfast. Write it down and keep it in a log. Take your medicines as prescribed Eat low salt foods--Limit salt (sodium) to 2000 mg per day.  Stay as active as you can everyday Limit all fluids for the day to less than 2 liters   Follow-Up in: 3-4 months with Dr Aundra Dubin  At the [facility], you and your health needs are our priority. We have a designated team specialized in the treatment of Heart Failure. This Care Team includes your primary Heart Failure Specialized Cardiologist (physician), Advanced Practice Providers (APPs- Physician Assistants and Nurse Practitioners), and Pharmacist who all work together to provide you with the care you need, when you need it.   You may see any of the following providers on your designated Care Team at your next follow up:  Dr. Glori Bickers Dr. Loralie Champagne Dr. Roxana Hires, NP Lyda Jester, [place] [facility] [place], [place] Forestine Na, NP Audry Riles, PharmD   Please be sure to bring in all your medications bottles to every appointment.   Need to Contact  us:  If you have any questions or concerns before your next appointment please send [place] a message through [place] or call our office at [phone].    TO LEAVE A MESSAGE FOR THE NURSE SELECT OPTION 2, PLEASE LEAVE A MESSAGE INCLUDING: YOUR NAME DATE OF BIRTH CALL BACK NUMBER REASON FOR CALL**this is important as we prioritize the call backs  YOU WILL RECEIVE A CALL BACK THE SAME DAY AS LONG AS YOU CALL BEFORE 4:00 PM

## 2022-12-02 ENCOUNTER — Encounter (HOSPITAL_COMMUNITY): Payer: Medicare Other

## 2022-12-02 ENCOUNTER — Other Ambulatory Visit (HOSPITAL_COMMUNITY): Payer: Medicare Other

## 2022-12-11 ENCOUNTER — Ambulatory Visit (HOSPITAL_COMMUNITY)
Admission: RE | Admit: 2022-12-11 | Discharge: 2022-12-11 | Disposition: A | Discharge status: Home / Self Care | Payer: Medicare PPO | Source: Ambulatory Visit | Attending: Internal Medicine | Admitting: Internal Medicine

## 2022-12-11 DIAGNOSIS — Z8673 Personal history of transient ischemic attack (TIA), and cerebral infarction without residual deficits: Secondary | ICD-10-CM | POA: Diagnosis not present

## 2022-12-11 DIAGNOSIS — I2510 Atherosclerotic heart disease of native coronary artery without angina pectoris: Secondary | ICD-10-CM | POA: Diagnosis not present

## 2022-12-11 DIAGNOSIS — I081 Rheumatic disorders of both mitral and tricuspid valves: Secondary | ICD-10-CM | POA: Diagnosis not present

## 2022-12-11 DIAGNOSIS — I5032 Chronic diastolic (congestive) heart failure: Secondary | ICD-10-CM | POA: Diagnosis not present

## 2022-12-11 DIAGNOSIS — I509 Heart failure, unspecified: Secondary | ICD-10-CM | POA: Diagnosis not present

## 2022-12-11 DIAGNOSIS — J449 Chronic obstructive pulmonary disease, unspecified: Secondary | ICD-10-CM | POA: Insufficient documentation

## 2022-12-11 LAB — ECHOCARDIOGRAM COMPLETE
AR max vel: 3.09 cm2
AV Area VTI: 3.28 cm2
AV Area mean vel: 3.19 cm2
AV Mean grad: 6 mmHg
AV Peak grad: 11.4 mmHg
Ao pk vel: 1.69 m/s
Area-P 1/2: 2.8 cm2
Calc EF: 45.3 %
MV VTI: 3.47 cm2
S' Lateral: 4.3 cm
Single Plane A2C EF: 48.9 %
Single Plane A4C EF: 40.1 %

## 2022-12-14 ENCOUNTER — Ambulatory Visit: Payer: Medicare PPO | Attending: Cardiology

## 2022-12-16 ENCOUNTER — Ambulatory Visit: Payer: Medicare PPO | Attending: Cardiovascular Disease | Admitting: *Deleted

## 2022-12-16 ENCOUNTER — Other Ambulatory Visit: Payer: Self-pay | Admitting: Cardiovascular Disease

## 2022-12-16 DIAGNOSIS — I359 Nonrheumatic aortic valve disorder, unspecified: Secondary | ICD-10-CM | POA: Diagnosis not present

## 2022-12-16 DIAGNOSIS — Z5181 Encounter for therapeutic drug level monitoring: Secondary | ICD-10-CM | POA: Diagnosis not present

## 2022-12-16 DIAGNOSIS — G459 Transient cerebral ischemic attack, unspecified: Secondary | ICD-10-CM | POA: Diagnosis not present

## 2022-12-16 LAB — POCT INR: INR: 3.4 — AB (ref 2.0–3.0)

## 2022-12-16 NOTE — Patient Instructions (Signed)
Description   Continue taking 1.5 tablets daily EXCEPT 2 tablets on Saturdays. Recheck INR 6 weeks.  Stay consistent with greens. Coumadin Clinic for any changes in medications or upcoming procedures. [phone]

## 2022-12-28 ENCOUNTER — Telehealth: Payer: Self-pay | Admitting: *Deleted

## 2022-12-28 NOTE — Telephone Encounter (Signed)
CALLED PATIENT TO INFORM OF CT FOR 01-13-23- ARRIVAL TIME- 2:15 PM @ [facility], NO RESTRICTIONS TO TEST, PATIENT TO RECEIVE RESULTS FROM DR. KINARD ON 01-18-23 @ 11 AM, LVM FOR A RETURN CALL

## 2023-01-04 DIAGNOSIS — C4442 Squamous cell carcinoma of skin of scalp and neck: Secondary | ICD-10-CM | POA: Diagnosis not present

## 2023-01-04 DIAGNOSIS — C44329 Squamous cell carcinoma of skin of other parts of face: Secondary | ICD-10-CM | POA: Diagnosis not present

## 2023-01-04 DIAGNOSIS — Z85828 Personal history of other malignant neoplasm of skin: Secondary | ICD-10-CM | POA: Diagnosis not present

## 2023-01-04 DIAGNOSIS — D1801 Hemangioma of skin and subcutaneous tissue: Secondary | ICD-10-CM | POA: Diagnosis not present

## 2023-01-04 DIAGNOSIS — L570 Actinic keratosis: Secondary | ICD-10-CM | POA: Diagnosis not present

## 2023-01-11 NOTE — Progress Notes (Signed)
Audwin Semper is here today for follow up post radiation to the lung. Radiation Treatment Dates: 02/18/2021 through 02/25/2021  Left lung Does the patient complain of any of the following: Pain:No Shortness of breath w/wo exertion:  No Cough: Some sinus drainage Hemoptysis: no Pain with swallowing: none Swallowing/choking concerns: none Appetite: ok Energy Level: ok Post radiation skin Changes: none Vitals:   01/18/23 1101  BP: (!) 151/60  Pulse: 75  Resp: 20  Temp: 97.9 F (36.6 C)  SpO2: 95%  Weight: 76.7 kg  Height: 5\' 8"  (1.727 m)       Additional comments if applicable:

## 2023-01-13 ENCOUNTER — Ambulatory Visit (HOSPITAL_COMMUNITY): Payer: Medicare PPO

## 2023-01-13 ENCOUNTER — Ambulatory Visit (HOSPITAL_BASED_OUTPATIENT_CLINIC_OR_DEPARTMENT_OTHER)
Admission: RE | Admit: 2023-01-13 | Discharge: 2023-01-13 | Disposition: A | Discharge status: Home / Self Care | Payer: Medicare PPO | Source: Ambulatory Visit | Attending: Radiation Oncology | Admitting: Radiation Oncology

## 2023-01-13 DIAGNOSIS — C3412 Malignant neoplasm of upper lobe, left bronchus or lung: Secondary | ICD-10-CM | POA: Diagnosis not present

## 2023-01-13 DIAGNOSIS — J439 Emphysema, unspecified: Secondary | ICD-10-CM | POA: Diagnosis not present

## 2023-01-13 DIAGNOSIS — C3490 Malignant neoplasm of unspecified part of unspecified bronchus or lung: Secondary | ICD-10-CM | POA: Diagnosis not present

## 2023-01-14 ENCOUNTER — Telehealth: Payer: Self-pay

## 2023-01-14 NOTE — Telephone Encounter (Signed)
Received call this AM from [facility] wanting to confirm Dr. Sondra Come was aware of most recent CT chest results.   With Dr. Sondra Come out of the office this RN updated Leona Singleton, PA-C. Was advised to call patient and let him know that "recent scan is suspicious for an infection Ask him if he's having any worsening SOB, chest pain, cough, hemoptysis, or fevers; and ask about exposures or if he has recently been treated for an infection"  Called and spoke with patient and relayed above information. Patient denied any fevers, worsening SOB or CP. He did report that he's had some congestion that's been hanging around for a few weeks/couple months. He did endorse a productive cough but is not able to expel any sputum. He denied any exposure to anyone that was sick/feeling poorly, and reports he hasn't been treated for an infection recently. Informed patient I would update Ellie with his responses and call him back tomorrow if she reccommended starting oral antibiotics. Informed patient that if he did not hear from me by end of day 01/15/23 then that meant antibiotics were not advised at this time, and he should plan to come in to his F/U appointment with Dr. Sondra Come on 01/18/23 as planned. Patient verbalized understanding and appreciation of call.

## 2023-01-15 ENCOUNTER — Telehealth: Payer: Self-pay | Admitting: Radiology

## 2023-01-15 NOTE — Telephone Encounter (Signed)
Called patient today to discuss results from CT scan on 01/13/23. The scan showed findings concerning for infection or possible aspiration. Today, the patient denied any new symptoms such as shortness or breath, chest pain, cough, or fevers. He denied any known sick contacts and has not been treated recently for an infection. He endorsed a 2 month history of a mild, intermittent cough. I informed him that since he is asymptomatic, we can wait to see him on Monday for his scheduled, routine follow-up wit Dr. Sondra Come. I discussed with the patient to go to the ER if he were to experience any extreme shortness of breath, chest pain, hemoptysis, or uncontrolled fevers. He expressed understanding and knows he can call back with any questions or concerns.    Leona Singleton, [place]

## 2023-01-17 ENCOUNTER — Other Ambulatory Visit (HOSPITAL_COMMUNITY): Payer: Self-pay | Admitting: Cardiology

## 2023-01-17 NOTE — Progress Notes (Signed)
Radiation Oncology         [phone] ________________________________  Name: [name] [name] MRN: [id number]  Date: 01/18/2023  DOB: [birth year]  Follow-Up Visit Note  CC: Shon Baton, MD  Garner Nash, DO  No diagnosis found.  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 He was last seen here for follow-up on 07/13/22.   Since his last visit, the patient followed up with Dr. Valeta Harms on 09/30/22. During which time, the patient denied any respiratory complaints, and declined any inhaler therapies.  His most recent chest CT on 01/13/23 shows: stable post treatment changes in the lingula around the fiducial's without evidence of recurrent tumor in this area; a severe and extensive interstitial and airspace process in the left lower lobe with marked interstitial thickening and diffuse alveolitis (likely due to infection vs aspiration as interstitial spread of tumor is unlikely given its in a different part of the left lung); stable borderline enlarged mediastinal and hilar nodes (likely due to the underlying lung disease); a stable RUL nodule measuring 5 mm; stable emphysematous changes and pulmonary scarring; and stable advanced vascular disease.          Otherwise, no other significant oncologic interval history since the patient was last seen here for follow-up.   ***                        Allergies:  has No Known Allergies.  Meds: Current Outpatient Medications  Medication Sig Dispense Refill   atorvastatin (LIPITOR) 80 MG tablet Take 80 mg by  mouth daily.     Blood Glucose Monitoring Suppl (ONETOUCH VERIO IQ SYSTEM) w/Device KIT      enoxaparin (LOVENOX) 80 MG/0.8ML injection Inject 0.8 mLs (80 mg total) into the skin every 12 (twelve) hours. As instructed by [facility] (Patient not taking: Reported on 07/13/2022) 8 mL 0   ENTRESTO 24-26 MG TAKE 1 TABLET BY MOUTH TWICE A DAY 60 tablet 6   Evolocumab 140 MG/ML SOAJ Inject 140 mg into the skin every 14 (fourteen) days.     furosemide (LASIX) 20 MG tablet TAKE 2 TABLETS (40 MG TOTAL) BY MOUTH DAILY. 180 tablet 3   glucose blood (ONETOUCH VERIO) test strip      glyBURIDE (DIABETA) 2.5 MG tablet Take 5 mg by mouth 2 (two) times daily with a meal.     INVOKANA 100 MG TABS tablet Take 100 mg by mouth daily.     levETIRAcetam (KEPPRA) 500 MG tablet Take 1 tablet (500 mg total) by mouth 2 (two) times daily. (Patient not taking: Reported on 07/13/2022) 60 tablet 11   nitroGLYCERIN (NITROSTAT) 0.4 MG SL tablet Place 1 tablet (0.4 mg total) under the tongue every 5 (five) minutes x 3 doses as needed for chest pain. (Patient not taking: Reported on 12/01/2022) 25 tablet 12   OneTouch Delica Lancets 40J MISC      pantoprazole (PROTONIX) 40 MG tablet TAKE 1 TABLET BY MOUTH EVERY DAY 90 tablet 3   sildenafil (VIAGRA) 100 MG tablet Take 100 mg by mouth as  needed.     spironolactone (ALDACTONE) 25 MG tablet TAKE 1 TABLET BY MOUTH EVERY DAY 90 tablet 3   warfarin (COUMADIN) 5 MG tablet TAKE 1 TO 1 AND 1/2 TABLETS BY MOUTH DAILY AS PRESCRIBED BY THE COUMADIN CLINIC. 120 tablet 1   No current facility-administered medications for this encounter.    Physical Findings: The patient is in no acute distress. Patient is alert and oriented.  vitals were not taken for this visit. .  No significant changes. Lungs are clear to auscultation bilaterally. Heart has regular rate and rhythm. No palpable cervical, supraclavicular, or axillary adenopathy. Abdomen soft, non-tender, normal bowel sounds.   Lab  Findings: Lab Results  Component Value Date   WBC 14.1 (H) 08/31/2022   HGB 17.5 (H) 08/31/2022   HCT 51.7 08/31/2022   MCV 91.0 08/31/2022   PLT 214 08/31/2022    Radiographic Findings: CT CHEST WO CONTRAST  Result Date: 01/14/2023 CLINICAL DATA:  Restaging non-small cell lung cancer. * Tracking Code: BO * EXAM: CT CHEST WITHOUT CONTRAST TECHNIQUE: Multidetector CT imaging of the chest was performed following the standard protocol without IV contrast. RADIATION DOSE REDUCTION: This exam was performed according to the departmental dose-optimization program which includes automated exposure control, adjustment of the mA and/or kV according to patient size and/or use of iterative reconstruction technique. COMPARISON:  Multiple previous imaging studies. The most recent CT scan is 07/11/2022 and the most recent PET-CT is January 2022 FINDINGS: Cardiovascular: The heart is mildly enlarged but stable. No pericardial effusion. Stable surgical changes from aortic valve replacement surgery. Stable advanced aortic and three-vessel coronary artery calcifications. Mediastinum/Nodes: Stable borderline enlarged mediastinal hilar nodes likely due to the underlying lung disease. No new or progressive findings. The esophagus is grossly. Lungs/Pleura: Stable underlying emphysematous changes and pulmonary scarring. Stable appearing post treatment changes in the lingula around the fiducials. No findings suspicious for recurrent tumor in this area. There is a severe and extensive interstitial and airspace process in the left lower lobe. There is marked interstitial thickening and diffuse alveolitis. Findings could be due to infection or aspiration. Interstitial spread of tumor is unlikely given its in a different part along. Recommend correlation with any pulmonary symptoms. The right lung is clear of an acute process. Stable 5 mm subpleural pulmonary nodule in the right middle lobe. Upper Abdomen: No significant upper  abdominal findings. No hepatic or adrenal gland lesions. Stable severe vascular disease. Musculoskeletal: Stable right chest wall lipoma measuring 8 cm. No supraclavicular or axillary adenopathy. Stable scattered lymph nodes. IMPRESSION: 1. Stable post treatment changes in the lingula around the fiducials. No findings suspicious for recurrent tumor in this area. 2. Severe and extensive interstitial and airspace process in the left lower lobe with marked interstitial thickening and diffuse alveolitis. Findings could be due to infection or aspiration. Interstitial spread of tumor is unlikely given its in a different part of the left lung. Recommend correlation with pulmonary symptoms. 3. Stable borderline enlarged mediastinal and hilar nodes likely due to the underlying lung disease. 4. Stable 5 mm subpleural pulmonary nodule in the right middle lobe. 5. Stable emphysematous changes and pulmonary scarring. 6. Stable advanced vascular disease. Aortic Atherosclerosis (ICD10-I70.0) and Emphysema (ICD10-J43.9). These results will be called to the ordering clinician or representative by the Radiologist Assistant, and communication documented in the PACS or [employer]. Electronically Signed   By: Marijo Sanes M.D.   On: 01/14/2023 11:06    Impression: Non-small cell carcinoma of the left upper lobe (  lingula), squamous cell, clinical stage IA2   The patient is recovering from the effects of radiation.  ***  Plan:  ***   *** minutes of total time was spent for this patient encounter, including preparation, face-to-face counseling with the patient and coordination of care, physical exam, and documentation of the encounter. ____________________________________  Blair Promise, PhD, MD  This document serves as a record of services personally performed by Gery Pray, MD. It was created on his behalf by Roney Mans, a trained medical scribe. The creation of this record is based on the scribe's personal  observations and the provider's statements to them. This document has been checked and approved by the attending provider.

## 2023-01-18 ENCOUNTER — Encounter: Payer: Self-pay | Admitting: Radiation Oncology

## 2023-01-18 ENCOUNTER — Ambulatory Visit
Admission: RE | Admit: 2023-01-18 | Discharge: 2023-01-18 | Disposition: A | Discharge status: Home / Self Care | Payer: Medicare PPO | Source: Ambulatory Visit | Attending: Radiation Oncology | Admitting: Radiation Oncology

## 2023-01-18 VITALS — BP 151/60 | HR 75 | Temp 97.9°F | Resp 20 | Ht 68.0 in | Wt 169.0 lb

## 2023-01-18 DIAGNOSIS — Z7984 Long term (current) use of oral hypoglycemic drugs: Secondary | ICD-10-CM | POA: Insufficient documentation

## 2023-01-18 DIAGNOSIS — C3410 Malignant neoplasm of upper lobe, unspecified bronchus or lung: Secondary | ICD-10-CM

## 2023-01-18 DIAGNOSIS — Z79899 Other long term (current) drug therapy: Secondary | ICD-10-CM | POA: Insufficient documentation

## 2023-01-18 DIAGNOSIS — C3412 Malignant neoplasm of upper lobe, left bronchus or lung: Secondary | ICD-10-CM | POA: Insufficient documentation

## 2023-01-18 DIAGNOSIS — J439 Emphysema, unspecified: Secondary | ICD-10-CM | POA: Diagnosis not present

## 2023-01-18 DIAGNOSIS — Z923 Personal history of irradiation: Secondary | ICD-10-CM | POA: Insufficient documentation

## 2023-01-19 ENCOUNTER — Other Ambulatory Visit: Payer: Self-pay | Admitting: Radiation Oncology

## 2023-01-19 DIAGNOSIS — C3410 Malignant neoplasm of upper lobe, unspecified bronchus or lung: Secondary | ICD-10-CM

## 2023-01-19 MED ORDER — AMOXICILLIN-POT CLAVULANATE 875-125 MG PO TABS: 1.0000 | ORAL_TABLET | Freq: Two times a day (BID) | ORAL | 0 refills | Status: DC

## 2023-01-26 DIAGNOSIS — Z85828 Personal history of other malignant neoplasm of skin: Secondary | ICD-10-CM | POA: Diagnosis not present

## 2023-01-26 DIAGNOSIS — C44329 Squamous cell carcinoma of skin of other parts of face: Secondary | ICD-10-CM | POA: Diagnosis not present

## 2023-01-27 ENCOUNTER — Ambulatory Visit: Payer: Medicare PPO | Attending: Cardiology | Admitting: *Deleted

## 2023-01-27 DIAGNOSIS — I359 Nonrheumatic aortic valve disorder, unspecified: Secondary | ICD-10-CM | POA: Diagnosis not present

## 2023-01-27 DIAGNOSIS — G459 Transient cerebral ischemic attack, unspecified: Secondary | ICD-10-CM

## 2023-01-27 DIAGNOSIS — Z5181 Encounter for therapeutic drug level monitoring: Secondary | ICD-10-CM | POA: Diagnosis not present

## 2023-01-27 LAB — POCT INR: INR: 2.7 (ref 2.0–3.0)

## 2023-01-27 NOTE — Patient Instructions (Signed)
Description   Continue taking  warfarin 1.5 tablets daily EXCEPT 2 tablets on Saturdays. Recheck INR 6 weeks. Stay consistent with greens. Coumadin Clinic for any changes in medications or upcoming procedures. [phone]

## 2023-02-03 ENCOUNTER — Ambulatory Visit (HOSPITAL_COMMUNITY): Payer: Medicare PPO

## 2023-02-11 ENCOUNTER — Ambulatory Visit (HOSPITAL_COMMUNITY)
Admission: RE | Admit: 2023-02-11 | Discharge: 2023-02-11 | Disposition: A | Discharge status: Home / Self Care | Payer: Medicare PPO | Source: Ambulatory Visit | Attending: Cardiovascular Disease | Admitting: Cardiovascular Disease

## 2023-02-11 DIAGNOSIS — I739 Peripheral vascular disease, unspecified: Secondary | ICD-10-CM | POA: Diagnosis not present

## 2023-02-12 LAB — VAS US ABI WITH/WO TBI

## 2023-02-14 ENCOUNTER — Other Ambulatory Visit: Payer: Self-pay | Admitting: Gastroenterology

## 2023-02-15 ENCOUNTER — Other Ambulatory Visit: Payer: Self-pay | Admitting: *Deleted

## 2023-02-15 ENCOUNTER — Other Ambulatory Visit: Payer: Self-pay

## 2023-02-15 DIAGNOSIS — I739 Peripheral vascular disease, unspecified: Secondary | ICD-10-CM

## 2023-02-25 ENCOUNTER — Encounter (HOSPITAL_COMMUNITY): Payer: Self-pay | Admitting: Cardiology

## 2023-02-25 ENCOUNTER — Ambulatory Visit (HOSPITAL_COMMUNITY)
Admission: RE | Admit: 2023-02-25 | Discharge: 2023-02-25 | Disposition: A | Discharge status: Home / Self Care | Payer: Medicare PPO | Source: Ambulatory Visit | Attending: Cardiology | Admitting: Cardiology

## 2023-02-25 VITALS — BP 110/70 | HR 76 | Wt 177.2 lb

## 2023-02-25 DIAGNOSIS — I110 Hypertensive heart disease with heart failure: Secondary | ICD-10-CM | POA: Diagnosis not present

## 2023-02-25 DIAGNOSIS — I359 Nonrheumatic aortic valve disorder, unspecified: Secondary | ICD-10-CM

## 2023-02-25 DIAGNOSIS — Z7901 Long term (current) use of anticoagulants: Secondary | ICD-10-CM | POA: Diagnosis not present

## 2023-02-25 DIAGNOSIS — I480 Paroxysmal atrial fibrillation: Secondary | ICD-10-CM

## 2023-02-25 DIAGNOSIS — I5042 Chronic combined systolic (congestive) and diastolic (congestive) heart failure: Secondary | ICD-10-CM | POA: Insufficient documentation

## 2023-02-25 DIAGNOSIS — I2510 Atherosclerotic heart disease of native coronary artery without angina pectoris: Secondary | ICD-10-CM | POA: Diagnosis not present

## 2023-02-25 DIAGNOSIS — I252 Old myocardial infarction: Secondary | ICD-10-CM | POA: Insufficient documentation

## 2023-02-25 DIAGNOSIS — Z8673 Personal history of transient ischemic attack (TIA), and cerebral infarction without residual deficits: Secondary | ICD-10-CM | POA: Insufficient documentation

## 2023-02-25 DIAGNOSIS — Z952 Presence of prosthetic heart valve: Secondary | ICD-10-CM | POA: Diagnosis not present

## 2023-02-25 DIAGNOSIS — I255 Ischemic cardiomyopathy: Secondary | ICD-10-CM | POA: Diagnosis not present

## 2023-02-25 DIAGNOSIS — I779 Disorder of arteries and arterioles, unspecified: Secondary | ICD-10-CM | POA: Diagnosis not present

## 2023-02-25 DIAGNOSIS — I472 Ventricular tachycardia, unspecified: Secondary | ICD-10-CM | POA: Diagnosis not present

## 2023-02-25 DIAGNOSIS — I5032 Chronic diastolic (congestive) heart failure: Secondary | ICD-10-CM

## 2023-02-25 DIAGNOSIS — I739 Peripheral vascular disease, unspecified: Secondary | ICD-10-CM | POA: Diagnosis not present

## 2023-02-25 DIAGNOSIS — Z955 Presence of coronary angioplasty implant and graft: Secondary | ICD-10-CM | POA: Diagnosis not present

## 2023-02-25 DIAGNOSIS — Z79899 Other long term (current) drug therapy: Secondary | ICD-10-CM | POA: Diagnosis not present

## 2023-02-25 LAB — BASIC METABOLIC PANEL
Anion gap: 12 (ref 5–15)
BUN: 25 mg/dL — ABNORMAL HIGH (ref 8–23)
CO2: 25 mmol/L (ref 22–32)
Calcium: 8.9 mg/dL (ref 8.9–10.3)
Chloride: 98 mmol/L (ref 98–111)
Creatinine, Ser: 1.23 mg/dL (ref 0.61–1.24)
GFR, Estimated: >60 mL/min (ref >60)
Glucose, Bld: 231 mg/dL — ABNORMAL HIGH (ref 70–99)
Potassium: 4.9 mmol/L (ref 3.5–5.1)
Sodium: 135 mmol/L (ref 135–145)

## 2023-02-25 LAB — CBC
HCT: 49.2 % (ref 39.0–52.0)
Hemoglobin: 17 g/dL (ref 13.0–17.0)
MCH: 31.4 pg (ref 26.0–34.0)
MCHC: 34.6 g/dL (ref 30.0–36.0)
MCV: 90.8 fL (ref 80.0–100.0)
Platelets: 215 10*3/uL (ref 150–400)
RBC: 5.42 MIL/uL (ref 4.22–5.81)
RDW: 15.6 % — ABNORMAL HIGH (ref 11.5–15.5)
WBC: 8.1 10*3/uL (ref 4.0–10.5)
nRBC: 0 % (ref 0.0–0.2)

## 2023-02-25 LAB — BRAIN NATRIURETIC PEPTIDE: B Natriuretic Peptide: 376.3 pg/mL — ABNORMAL HIGH (ref 0.0–100.0)

## 2023-02-25 MED ORDER — FUROSEMIDE 20 MG PO TABS: ORAL_TABLET | ORAL | 3 refills | Status: DC

## 2023-02-25 MED ORDER — CARVEDILOL 3.125 MG PO TABS: 3.1250 mg | ORAL_TABLET | Freq: Two times a day (BID) | ORAL | 3 refills | Status: DC

## 2023-02-25 NOTE — Progress Notes (Signed)
ReDS Vest / Clip - 02/25/23 1200       ReDS Vest / Clip   Station Marker C    Ruler Value 30    ReDS Value Range Low volume    ReDS Actual Value 34

## 2023-02-25 NOTE — Progress Notes (Signed)
PCP: Shon Baton, MD HF Cardiology: Dr. Jon Billings [name] [name] is a 77 y.o. male with a history of CAD s/p CABG, mechanical AVR on coumadin, extensive PAD, DM, HTN, HLD, ITP, TIA, and recurrent GI bleed. Note, requires lovenox bridging off heparin.   Dr. Gwenlyn Found follows him for extensive PAD. He has had an aortobifem bypass in 2016 with follow up iliac stenting and femoral endarterectomy. In 2017, he had subsequent ostial and mid right SFA intervention. Hx of left renal artery stenting in 2016 with repeat intervention on left for 75% in-stent restenosis, with progression of disease on the right renal artery showing 60% stenosis on duplex 07/12/19. Echo 09/17/19 with normal EF of 60-65% and normal AVR function; chronic diastolic heart failure.    Admitted in 10/20 with chest pain. He underwent heart cath 09/18/19 that showed 99% left main stenosis and patent LIMA to occluded LAD and a patent sequential vein to the PDA and PLA of an occluded dominant right. His left main stenosis jeopardized the moderately large ramus and high first marginal and nondominant Cx which has 90% mid AV groove Cx stenosis. Aggressive medical therapy was recommended initially.  He was readmitted in 10/20 with CHF and chest pain. Hospital course complicated by A flutter/low output heart failure.  S/P successful orbital atherectomy, PTCA, and stenting of protected left main with a 4.0x15 mm resolute onyx DES 11/3. LIMA-LAD patent. SVG-PDA patent. ECHO this admission showed EF 30-35%, mechanical AoV ok. TEE 11/9 w/ improved EF, 40-45%. Placed on milrinone initially with low co-ox and later weaned off.  He was cardioverted from atrial flutter back to NSR.   2/21 peripheral arterial dopplers showed 75-99% stenosis right SFA.  In 3/21, he had stenting to right SFA.    Echo in 6/21 showed EF up to 55-60%.    In 12/21, he had an atrial fibrillation ablation.   He was admitted in 1/22 with shortness of breath and was found to be  in atrial fibrillation with RVR. He was hypertensive.  Troponin was negative. He was diuresed and discharged, cardiology was not notified.  Echo during this admission showed EF 60-65%, normal RV size and systolic function, stable mechanical aortic valve.   During the 1/22 admission, CT chest showed a lung nodule.  Followup PET-CT suggestive of lung cancer, biopsy with squamous cell lung cancer.  He underwent radiation therapy, now completed.   DCCV to NSR in 1/22.   He has had episodes concerning for vertebrobasilar TIAs.  Significant disease noted in the vertebral arteries.  He was seen by neurology who wanted him referred to interventional neuroradiology for angiography with possible PCI to the vertebrals. It does not appear that this ever occurred.   Echo in 1/24 showed EF 45-50%, basal to mid inferolateral hypokinesis, mildly decreased RV systolic function, mechanical aortic valve with mean gradient 6 mmHg.    Today he returns for HF follow up.  Weight is up 7 lbs.  No chest pain. No claudication.  He has noted more dyspnea with moderate exertion such as walking up hills and inclines when he plays golf. Generally ok on flat ground.  He has some balance difficulty but no falls. No BRBPR/melena. No orthopnea/PND.  No palpitations.   ECG (personally reviewed): NSR, 1st degree AVB 238 msec, nonspecific lateral TWIs  REDS clip 34%  Labs (11/20): K 4.3, creatinine 1.3, hgb 12.5 Labs (12/20): LDL 56, HDL 80, TGs 179 Labs (1/21): K 4.8, creatinine 1.45 Labs (4/21): K 5.2, creatinine 1.34 Labs (  6/21): LDL 44 Labs (1/22): K 4, creatinine 1.3 Labs (3/22): K 4.4, creatinine 1.02 Labs (3/23): K 4.4, creatinine 1.38 Labs (9/23): LDL 63, TGs 103 Labs (12/23): K 4.1, creatinine 1.16  PMH  1. CAD:  S/P CABG (3/04) with LIMA-LAD, seq SVG-PDA/PLV.  - NSTEMI 10/20 with cath showing patent LIMA-LAD and SVG-PDA/PLV but 99% LM stenosis, occluded LAD, moderate ramus and high OM1 jeopardized, also 90% mid AV  groove LCx stenosis.  Occluded RCA.  Patient ultimately had orbital atherectomy and stenting of left main with a 4.0 x 15 mm Resolute Onyx DES.  2. Mechanical aortic valve: TEE 11/20 showed stable-appearing mechanical valve, mean gradient 9 mmHg.  3. PAD: Aortobifemoral bypass in 2016 with follow up iliac stenting and femoral endarterectomy. In 2017, he had subsequent ostial and mid right SFA intervention. - Angiography 10/20 with 80% in-stent restenosis in proximal right SFA.  - Peripheral arterial dopplers (2/21) with 75-99% R SFA stenosis in prior stented area.  - 3/21 Right SFA stent (Dr. Gwenlyn Found).  - ABIs (1/22): Stable mild left SFA disease.  - Peripheral arterial dopplers (2/23): 50-99% proximal R SFA in-stent restenosis.  Medical management for now.  - Peripheral arterial dopplers (3/24): 50-99% proximal R SFA in-stent restenosis.  4. Type 2 diabetes.  5. HTN - Renal artery dopplers (8/21): No significant stenosis.  6. Hyperlipidemia 7. H/o ITP  8. TIAs  9. Chronic Systolic HF: Ischemic cardiomyopathy.  - Echo (10/20): EF 30-35%, mechanical AoV ok.  - TEE (11/20): w/ improved EF, 40-45%. Mechanical aortic valve with mean gradient 9 mmHg, normal RV.  - Echo (6/21): EF 55-60%, mild LVH, mechanical AoV with mean gradient 9 mmHg.  - Echo (1/22): EF 60-65%, normal RV size and systolic function, stable mechanical aortic valve.  - Echo (1/24): EF 45-50%, basal to mid inferolateral hypokinesis, mildly decreased RV systolic function, mechanical aortic valve with mean gradient 6 mmHg. 10. Atrial fibrillation: Paroxysmal.  - Atrial fibrillation ablation 12/21.  - DCCV 1/22 11. Renal artery stenosis: Hx of left renal artery stenting in 2016 with repeat intervention on left for 75% in-stent restenosis, with progression of disease on the right renal artery showing 60% stenosis on duplex 07/12/19. - Renal artery dopplers (8/23): 1-59% bilateral RAS.  12. Carotid stenosis: 4/21 carotid dopplers with  right subclavian stenosis, 40-59% RICA stenosis.  - Carotid dopplers (4/22): 1-39% BICA stenosis.  13. Squamous cell lung cancer: Treated with XRT.     ROS: All systems negative except as listed in HPI, PMH and Problem List.  Social History   Socioeconomic History   Marital status: Divorced    Spouse name: Not on file   Number of children: 3   Years of education: Not on file   Highest education level: Not on file  Occupational History   Occupation: Retired  Tobacco Use   Smoking status: Former    Years: 30    Types: Cigarettes, Cigars    Quit date: 12/15/1993    Years since quitting: 29.2   Smokeless tobacco: Never   Tobacco comments:    occasional cigar  Vaping Use   Vaping Use: Never used  Substance and Sexual Activity   Alcohol use: Yes    Alcohol/week: 16.0 standard drinks of alcohol    Types: 1 Cans of beer, 1 Shots of liquor, 14 Standard drinks or equivalent per week    Comment: drinks 2 martini's a night (2 shots in each)   Drug use: No   Sexual activity: Yes  Other Topics Concern   Not on file  Social History Narrative   Tries to remain active.  Frequent golfer but claudication limits this.   Social Determinants of Health   Financial Resource Strain: Not on file  Food Insecurity: Not on file  Transportation Needs: Not on file  Physical Activity: Not on file  Stress: Not on file  Social Connections: Not on file  Intimate Partner Violence: Not on file   Family History  Problem Relation Age of Onset   Coronary artery disease Mother        bypass surgery - deceased   Heart disease Father        murmur, valve replacement - deceased   Breast cancer Sister    Diabetes Other        grandmother   Diabetes Paternal Grandmother    Diabetes Paternal Aunt    Colon cancer Neg Hx    Colon polyps Neg Hx    Esophageal cancer Neg Hx    Rectal cancer Neg Hx    Stomach cancer Neg Hx    Current Outpatient Medications  Medication Sig Dispense Refill   atorvastatin  (LIPITOR) 80 MG tablet Take 80 mg by mouth daily.     Blood Glucose Monitoring Suppl (ONETOUCH VERIO IQ SYSTEM) w/Device KIT      carvedilol (COREG) 3.125 MG tablet Take 1 tablet (3.125 mg total) by mouth 2 (two) times daily. 180 tablet 3   ENTRESTO 24-26 MG TAKE 1 TABLET BY MOUTH TWICE A DAY 60 tablet 6   Evolocumab 140 MG/ML SOAJ Inject 140 mg into the skin every 14 (fourteen) days.     glucose blood (ONETOUCH VERIO) test strip      glyBURIDE (DIABETA) 2.5 MG tablet Take 5 mg by mouth 2 (two) times daily with a meal.     INVOKANA 100 MG TABS tablet Take 100 mg by mouth daily.     nitroGLYCERIN (NITROSTAT) 0.4 MG SL tablet Place 1 tablet (0.4 mg total) under the tongue every 5 (five) minutes x 3 doses as needed for chest pain. 25 tablet 12   OneTouch Delica Lancets [id number] MISC      sildenafil (VIAGRA) 100 MG tablet Take 100 mg by mouth as needed.     spironolactone (ALDACTONE) 25 MG tablet TAKE 1 TABLET BY MOUTH EVERY DAY 90 tablet 3   warfarin (COUMADIN) 5 MG tablet TAKE 1 TO 1 AND 1/2 TABLETS BY MOUTH DAILY AS PRESCRIBED BY THE COUMADIN CLINIC. 120 tablet 1   enoxaparin (LOVENOX) 80 MG/0.8ML injection Inject 0.8 mLs (80 mg total) into the skin every 12 (twelve) hours. As instructed by [facility]madin Clinic (Patient not taking: Reported on 07/13/2022) 8 mL 0   furosemide (LASIX) 20 MG tablet 60 mg daily alternating with 40 mg daily. 180 tablet 3   No current facility-administered medications for this encounter.   BP 110/70 (BP Location: Left Arm)   Pulse 76   Wt 80.4 kg (177 lb 3.2 oz)   SpO2 91%   BMI 26.94 kg/m   Wt Readings from Last 3 Encounters:  02/25/23 80.4 kg (177 lb 3.2 oz)  01/18/23 76.7 kg (169 lb)  12/01/22 77.1 kg (170 lb)   PHYSICAL EXAM: General: NAD Neck: JVP 8 cm with HJR, no thyromegaly or thyroid nodule.  Lungs: Clear to auscultation bilaterally with normal respiratory effort. CV: Nondisplaced PMI.  Heart regular S1/S2 with mechanical S2, no S3/S4, no murmur.  No  peripheral edema.  No carotid bruit.  Unable  to palpate pedal pulses.  Abdomen: Soft, nontender, no hepatosplenomegaly, no distention.  Skin: Intact without lesions or rashes.  Neurologic: Alert and oriented x 3.  Psych: Normal affect. Extremities: No clubbing or cyanosis.  HEENT: Normal.   ASSESSMENT & PLAN: 1. Chronic HF with mid range EF: Ischemic cardiomyopathy.  Echo 10/06/19 showed EF 30-35% and wall motion abnormalities, prior echo earlier in 10/20 showed EF 50-55%.  Cath 10/20 showed jeopardized ramus and LCx territory (99% distal left main, occluded LAD, patent LIMA-LAD). Concerned that ischemia in this territory triggered CHF, worsening of LV function.  Now s/p DES to left main, TEE in 11/20 after intervention showed EF higher at 40-45%.  Echo in 1/22 showed EF 60-65% with normal RV. Echo in 1/24 showed  EF 45-50%, basal to mid inferolateral hypokinesis, mildly decreased RV systolic function, mechanical aortic valve with mean gradient 6 mmHg.  Increased exertional dyspnea recently, NYHA class II-III, with weight gain.  Mild volume overload on exam with borderline REDS clip.  - Increase Lasix to 60 mg daily alternating with 40 mg daily.  BMET/BNP today and BMET in 10 days.  - Continue Entresto 24/26 bid.   - Continue spironolactone 25 mg daily. - He is on canagliflozin.    - Add Coreg 3.125 mg bid.  2. CAD: Complicated disease, coronary angiography 10/20 showed patent SVG-RCA territory and patent LIMA-LAD.  The proximal LAD was occluded and there was 99% distal left main stenosis.  This left the ramus and LCx in jeopardy.  He was not a good candidate for protected PCI => cannot place Impella with mechanical aortic valve and peripheral vascular disease likely precludes a femoral IABP.  It was decided to try to manage him medically.  However, he returned with progressive chest pain episodes and NSTEMI, hs-TnI up to 935. S/p successful orbital atherectomy and stenting of the left main with a  4.0x15 mm Resolute Onyx DES 10/10/19.  EF on echo in 1/24 was lower at 45-50% but no chest pain.   - I do not think that there is an indication at this point with repeat angiography.  - Will not start him on ASA 81 as he has had GI bleeding x 2 when ASA was added to warfarin in the past.  - Continue warfarin for mechanical valve.    - Continue atorvastatin 80 daily + Repatha.  Good lipids in 11/23.   3. Mechanical aortic valve: Stable on 1/24 echo. Crisp mechanical sounds on exam. - Will not use ASA given GI bleeding on combination of ASA and warfarin.  - Continue warfarin, goal INR 2.5-3.5 with episodes concerning for TIAs.  4. NSVT: Noted 10/20 admission. 5. H/o GI bleeding: Continue PPI for GI protection. - CBC today.  6. PAD: Patient with aorto-bifemoral bypass, h/o iliac stenting, h/o femoral endarterectomy, PCI to right SFA in 2017.  Angiography in 10/20 with 80% ISR in right SFA.  He had PCI to right SFA in 3/21, no claudication now.  Repeat ABIs in 1/22 with mild left SFA disease. Peripheral arterial dopplers in 2/23 showed 50-99% in-stent restenosis in the right SFA stent, repeat dopplers in 3/24 were unchanged.  Minimal claudication.  - Medical management for now per Dr. Gwenlyn Found.  7. Carotid/subclavian stenosis: Mild disease on dopplers in 4/22.  - Followed at [facility].  8. DM2: He is on SGLT2 inhibitor (canagliflozin).    9. Atrial fibrillation/atypical flutter:  TEE-guided DCCV in 11/20, atrial fibrillation ablation in 12/21.  Admitted with CHF exacerbation in 1/22  in the setting of recurrence of atrial fibrillation.  Atypical flutter with DCCV in 1/22.  He is off amiodarone. NSR today, no palpitations.  - Continue coumadin.  10. TIAs:  Saw neurology, has been noted to have severe vertebral artery disease.   - Continue statin/Repatha.  - With TIAs,  INR goal to 2.5-3.5 for mechanical valve.  Off ASA with GI bleeding.   Follow up with APP in 3 months.   Loralie Champagne  02/25/2023

## 2023-02-25 NOTE — Patient Instructions (Signed)
START Carvedilol 3.125 mg Twice daily  CHANGE Lasix to 60 mg daily, alternating with 40 mg daily.  Labs done today, your results will be available in MyChart, we will contact you for abnormal readings.  Repeat blood work in 10 days.  Your physician recommends that you schedule a follow-up appointment in: 3 months  If you have any questions or concerns before your next appointment please send [place] a message through [facility] or call our office at [phone].    TO LEAVE A MESSAGE FOR THE NURSE SELECT OPTION 2, PLEASE LEAVE A MESSAGE INCLUDING: YOUR NAME DATE OF BIRTH CALL BACK NUMBER REASON FOR CALL**this is important as we prioritize the call backs  YOU WILL RECEIVE A CALL BACK THE SAME DAY AS LONG AS YOU CALL BEFORE 4:00 PM  At the [facility], you and your health needs are our priority. As part of our continuing mission to provide you with exceptional heart care, we have created designated Provider Care Teams. These Care Teams include your primary Cardiologist (physician) and Advanced Practice Providers (APPs- Physician Assistants and Nurse Practitioners) who all work together to provide you with the care you need, when you need it.   You may see any of the following providers on your designated Care Team at your next follow up: Dr Glori Bickers Dr Loralie Champagne Dr. Roxana Hires, NP Lyda Jester, [place] [facility], [place] Forestine Na, NP Audry Riles, PharmD   Please be sure to bring in all your medications bottles to every appointment.    Thank you for choosing [facility]

## 2023-03-02 ENCOUNTER — Ambulatory Visit (HOSPITAL_BASED_OUTPATIENT_CLINIC_OR_DEPARTMENT_OTHER)
Admission: RE | Admit: 2023-03-02 | Discharge: 2023-03-02 | Disposition: A | Discharge status: Home / Self Care | Payer: Medicare PPO | Source: Ambulatory Visit | Attending: Radiation Oncology | Admitting: Radiation Oncology

## 2023-03-02 DIAGNOSIS — I517 Cardiomegaly: Secondary | ICD-10-CM | POA: Insufficient documentation

## 2023-03-02 DIAGNOSIS — R911 Solitary pulmonary nodule: Secondary | ICD-10-CM | POA: Diagnosis not present

## 2023-03-02 DIAGNOSIS — C3410 Malignant neoplasm of upper lobe, unspecified bronchus or lung: Secondary | ICD-10-CM | POA: Diagnosis not present

## 2023-03-02 DIAGNOSIS — J439 Emphysema, unspecified: Secondary | ICD-10-CM | POA: Diagnosis not present

## 2023-03-02 DIAGNOSIS — C3490 Malignant neoplasm of unspecified part of unspecified bronchus or lung: Secondary | ICD-10-CM | POA: Diagnosis not present

## 2023-03-03 ENCOUNTER — Other Ambulatory Visit: Payer: Self-pay | Admitting: Radiation Oncology

## 2023-03-03 DIAGNOSIS — C3410 Malignant neoplasm of upper lobe, unspecified bronchus or lung: Secondary | ICD-10-CM

## 2023-03-04 ENCOUNTER — Telehealth: Payer: Self-pay | Admitting: Pulmonary Disease

## 2023-03-04 ENCOUNTER — Ambulatory Visit
Admission: RE | Admit: 2023-03-04 | Discharge: 2023-03-04 | Disposition: A | Discharge status: Home / Self Care | Payer: Medicare PPO | Source: Ambulatory Visit | Attending: Radiation Oncology | Admitting: Radiation Oncology

## 2023-03-04 NOTE — Telephone Encounter (Signed)
Received urgent referral from Dr Sondra Come. Dx states that Consolidation in left lower lung which has not cleared on repeat chest Ct. According to [facility] note from 10/01/23, Dr Valeta Harms stated for pt to followup as needed as Dr Sondra Come would be taking over pt's care. Dr Valeta Harms, please advise if pt needs to have a followup with you. Thanks!

## 2023-03-08 DIAGNOSIS — E1122 Type 2 diabetes mellitus with diabetic chronic kidney disease: Secondary | ICD-10-CM | POA: Diagnosis not present

## 2023-03-08 DIAGNOSIS — Z1212 Encounter for screening for malignant neoplasm of rectum: Secondary | ICD-10-CM | POA: Diagnosis not present

## 2023-03-08 DIAGNOSIS — I700 Atherosclerosis of aorta: Secondary | ICD-10-CM | POA: Diagnosis not present

## 2023-03-08 DIAGNOSIS — E785 Hyperlipidemia, unspecified: Secondary | ICD-10-CM | POA: Diagnosis not present

## 2023-03-08 DIAGNOSIS — D649 Anemia, unspecified: Secondary | ICD-10-CM | POA: Diagnosis not present

## 2023-03-08 NOTE — Telephone Encounter (Signed)
Pt scheduled on 03/17/23 at 10:30 with Dr. Valeta Harms. Nothing further needed at this time.

## 2023-03-09 ENCOUNTER — Ambulatory Visit
Admission: RE | Admit: 2023-03-09 | Discharge: 2023-03-09 | Disposition: A | Discharge status: Home / Self Care | Payer: Medicare PPO | Source: Ambulatory Visit | Attending: Internal Medicine | Admitting: Internal Medicine

## 2023-03-09 DIAGNOSIS — I5032 Chronic diastolic (congestive) heart failure: Secondary | ICD-10-CM

## 2023-03-09 LAB — BASIC METABOLIC PANEL
Anion gap: 10 (ref 5–15)
BUN: 27 mg/dL — ABNORMAL HIGH (ref 8–23)
CO2: 29 mmol/L (ref 22–32)
Calcium: 8.9 mg/dL (ref 8.9–10.3)
Chloride: 99 mmol/L (ref 98–111)
Creatinine, Ser: 1.39 mg/dL — ABNORMAL HIGH (ref 0.61–1.24)
GFR, Estimated: 52 mL/min — ABNORMAL LOW (ref >60)
Glucose, Bld: 251 mg/dL — ABNORMAL HIGH (ref 70–99)
Potassium: 4.4 mmol/L (ref 3.5–5.1)
Sodium: 138 mmol/L (ref 135–145)

## 2023-03-10 ENCOUNTER — Ambulatory Visit: Payer: Medicare PPO

## 2023-03-12 DIAGNOSIS — I4819 Other persistent atrial fibrillation: Secondary | ICD-10-CM | POA: Diagnosis not present

## 2023-03-12 DIAGNOSIS — D6869 Other thrombophilia: Secondary | ICD-10-CM | POA: Diagnosis not present

## 2023-03-12 DIAGNOSIS — Z952 Presence of prosthetic heart valve: Secondary | ICD-10-CM | POA: Diagnosis not present

## 2023-03-12 DIAGNOSIS — Z0000 Encounter for general adult medical examination without abnormal findings: Secondary | ICD-10-CM | POA: Diagnosis not present

## 2023-03-12 DIAGNOSIS — E1122 Type 2 diabetes mellitus with diabetic chronic kidney disease: Secondary | ICD-10-CM | POA: Diagnosis not present

## 2023-03-12 DIAGNOSIS — E1151 Type 2 diabetes mellitus with diabetic peripheral angiopathy without gangrene: Secondary | ICD-10-CM | POA: Diagnosis not present

## 2023-03-12 DIAGNOSIS — C3492 Malignant neoplasm of unspecified part of left bronchus or lung: Secondary | ICD-10-CM | POA: Diagnosis not present

## 2023-03-12 DIAGNOSIS — I6523 Occlusion and stenosis of bilateral carotid arteries: Secondary | ICD-10-CM | POA: Diagnosis not present

## 2023-03-12 DIAGNOSIS — Z7901 Long term (current) use of anticoagulants: Secondary | ICD-10-CM | POA: Diagnosis not present

## 2023-03-15 ENCOUNTER — Ambulatory Visit (INDEPENDENT_AMBULATORY_CARE_PROVIDER_SITE_OTHER): Payer: Medicare PPO | Admitting: Podiatry

## 2023-03-15 ENCOUNTER — Encounter: Payer: Self-pay | Admitting: Podiatry

## 2023-03-15 DIAGNOSIS — I739 Peripheral vascular disease, unspecified: Secondary | ICD-10-CM | POA: Diagnosis not present

## 2023-03-15 DIAGNOSIS — B351 Tinea unguium: Secondary | ICD-10-CM

## 2023-03-15 DIAGNOSIS — D689 Coagulation defect, unspecified: Secondary | ICD-10-CM

## 2023-03-15 NOTE — Progress Notes (Signed)
This patient returns to my office for at risk foot care.  This patient requires this care by a professional since this patient will be at risk due to having diabetes , chronic anticoagulation renal disease and PVD.  Patient is taking coumadin.  This patient is unable to cut nails himself since the patient cannot reach his nails.These nails are painful walking and wearing shoes.  This patient presents for at risk foot care today.  General Appearance  Alert, conversant and in no acute stress.  Vascular  Dorsalis pedis and posterior tibial  pulses are palpable  bilaterally.  Capillary return is within normal limits  bilaterally. Temperature is within normal limits  bilaterally.  Neurologic  Senn-Weinstein monofilament wire test within normal limits  bilaterally. Muscle power within normal limits bilaterally.  Nails Thick disfigured discolored nails with subungual debris  from hallux to fifth toes bilaterally. No evidence of bacterial infection or drainage bilaterally.  Orthopedic  No limitations of motion  feet .  No crepitus or effusions noted.  No bony pathology or digital deformities noted.  Skin  normotropic skin with no porokeratosis noted bilaterally.  No signs of infections or ulcers noted.     Onychomycosis  Pain in right toes  Pain in left toes  Consent was obtained for treatment procedures.   Mechanical debridement of nails 1-5  bilaterally performed with a nail nipper.  Filed with dremel without incident.    Return office visit    4 months                  Told patient to return for periodic foot care and evaluation due to potential at risk complications.   Shahab Polhamus DPM   

## 2023-03-17 ENCOUNTER — Ambulatory Visit (INDEPENDENT_AMBULATORY_CARE_PROVIDER_SITE_OTHER): Payer: Medicare PPO | Admitting: Pulmonary Disease

## 2023-03-17 ENCOUNTER — Ambulatory Visit: Payer: Medicare PPO | Attending: Cardiovascular Disease

## 2023-03-17 ENCOUNTER — Other Ambulatory Visit (HOSPITAL_COMMUNITY): Payer: Self-pay | Admitting: Cardiology

## 2023-03-17 ENCOUNTER — Encounter: Payer: Self-pay | Admitting: Internal Medicine

## 2023-03-17 ENCOUNTER — Other Ambulatory Visit: Payer: Medicare PPO

## 2023-03-17 ENCOUNTER — Other Ambulatory Visit: Payer: Self-pay

## 2023-03-17 VITALS — BP 120/60 | HR 57 | Ht 68.0 in | Wt 172.2 lb

## 2023-03-17 DIAGNOSIS — C3410 Malignant neoplasm of upper lobe, unspecified bronchus or lung: Secondary | ICD-10-CM | POA: Diagnosis not present

## 2023-03-17 DIAGNOSIS — R918 Other nonspecific abnormal finding of lung field: Secondary | ICD-10-CM | POA: Diagnosis not present

## 2023-03-17 DIAGNOSIS — R911 Solitary pulmonary nodule: Secondary | ICD-10-CM

## 2023-03-17 DIAGNOSIS — Z952 Presence of prosthetic heart valve: Secondary | ICD-10-CM | POA: Diagnosis not present

## 2023-03-17 DIAGNOSIS — I359 Nonrheumatic aortic valve disorder, unspecified: Secondary | ICD-10-CM

## 2023-03-17 DIAGNOSIS — J432 Centrilobular emphysema: Secondary | ICD-10-CM | POA: Diagnosis not present

## 2023-03-17 DIAGNOSIS — Z5181 Encounter for therapeutic drug level monitoring: Secondary | ICD-10-CM

## 2023-03-17 DIAGNOSIS — Z87891 Personal history of nicotine dependence: Secondary | ICD-10-CM | POA: Diagnosis not present

## 2023-03-17 DIAGNOSIS — Z7901 Long term (current) use of anticoagulants: Secondary | ICD-10-CM | POA: Diagnosis not present

## 2023-03-17 DIAGNOSIS — C3492 Malignant neoplasm of unspecified part of left bronchus or lung: Secondary | ICD-10-CM

## 2023-03-17 LAB — POCT INR: INR: 3.9 — AB (ref 2.0–3.0)

## 2023-03-17 NOTE — Patient Instructions (Signed)
Thank you for visiting Dr. Tonia Brooms at [facility]. Today we recommend the following:  I will speak with rad onc and cards to discuss next steps  We will plan for a bronchoscopy to eval after I talk with the team   Return if symptoms worsen or fail to improve.    Please do your part to reduce the spread of COVID-19.

## 2023-03-17 NOTE — Patient Instructions (Signed)
Description   Only take 1/2 tablet tomorrow and then continue taking  warfarin 1.5 tablets daily EXCEPT 2 tablets on Saturdays.  Recheck INR 5 weeks. Stay consistent with greens.  Coumadin Clinic for any changes in medications or upcoming procedures. [phone]

## 2023-03-17 NOTE — Progress Notes (Signed)
Synopsis: Referred in January 2021 for PET avid lung nodule by Creola Corn, MD  Subjective:   PATIENT ID: [name] [name] GENDER: male DOB: 23-Jan-1946, MRN: [id number]  Chief Complaint  Patient presents with   Follow-up    F/up, possible pneu.    [name] past medical history of aortic stenosis, mechanical Saint Jude AVR on Coumadin, heart failure, COPD, history of coronary artery disease status post CABG, ITP, peripheral arterial disease, TIA, type 2 diabetes.Patient seen today in the office to discuss CT scan results.  Initial CT was completed at the beginning of January which revealed a 1.5 cm lingular nodule.  Patient had a PET scan completed which revealed an SUV max of 2.311 x 16 mm lingular nodule concerning for primary bronchogenic carcinoma.  Patient has a significant cardiac history.  Last week was taken to endoscopy for cardioversion.  By Dr. Shirlee Latch.  Currently anticoagulated with Coumadin.  History of AVR.  He has no complaints today from a respiratory standpoint.  Former smoker.  Denies hemoptysis weight loss fatigue.  OV 02/19/2021: Here today for follow-up after bronchoscopy.  Cytology from bronchoscopy was positive for a squamous cell carcinoma.  Patient was referred to radiation oncology.  Met with Dr. Mauricio Po on 02/06/2021.  Office notes reviewed today.  Patient was planned for SBRT for stage Ia 2 lung cancer.  Patient had his first treatment yesterday.  He did well with this.  Plan for 3 treatments of SBRT with follow-up.  From a respiratory standpoint patient is doing well with no complaints today.  Denies fevers chills night sweats weight loss and hemoptysis.  OV 09/30/2022: Patient here today for follow-up.  He is currently managed by radiation oncology.  He has a serial images completed there.  He is not on any inhalers or medications at this time for breathing.  He has no respiratory complaints today.  This is a 1 year return follow-up.  OV 14  2024: Here today for follow-up after CT imaging.  He has had a persistent abnormal CT scan of the chest that has progressed over the past 8 months with infiltrate in the left lower lobe.  He now has ongoing hemoptysis.  He is on Coumadin for a mechanical Saint Jude valve replacement.  He had SBRT treatments to a lingular nodule 2 years ago his stable CT imaging from that standpoint.  We progression has led to cough and some shortness of breath associated with this he has areas of groundglass and centrilobular septal thickening.    Past Medical History:  Diagnosis Date   Adenomatous colon polyp 09/1997   Anemia    Aortic stenosis    s/p st. jude mechanical AVR - Chronic Coumadin   Blood transfusion    "related to ITP"   CHF (congestive heart failure)    COPD (chronic obstructive pulmonary disease)    patient denies this dx on 01/27/21   Coronary artery disease    s/p cabg x 3 11/2003: lima-lad, seq vg to rpda and rpl   Diverticulitis of colon    Dysrhythmia    a-fib   GERD (gastroesophageal reflux disease)    Heart murmur    History of radiation therapy 02/18/2021, 02/20/2021, 02/25/2021   SBRT to left lung     Dr Antony Blackbird   Hyperlipidemia    Hypertension    ITP (idiopathic thrombocytopenic purpura)    Lung cancer    last week   Peripheral arterial disease    a.  history of aortobifemoral bypass grafting by Dr. Tawanna Cooler early b. LE angiography 04/22/2015 patent aortobifem graft, DES to R SFA   Peripheral vascular disease    s/p Left external Iliac Artery stenting and subsequent left femoral endarterectomy 02/2011- post op course complicated by wound infxn req I&D 03/2011   Renal artery stenosis, native, bilateral    a. bilateral renal artery stenosis by recent duplex ultrasound b. L renal artery stent 02/2015, R renal artery patent on angiogram   Stroke 2015   TIA (transient ischemic attack) ~ 2013   Type II diabetes mellitus      Family History  Problem Relation Age of Onset    Coronary artery disease Mother        bypass surgery - deceased   Heart disease Father        murmur, valve replacement - deceased   Breast cancer Sister    Diabetes Other        grandmother   Diabetes Paternal Grandmother    Diabetes Paternal Aunt    Colon cancer Neg Hx    Colon polyps Neg Hx    Esophageal cancer Neg Hx    Rectal cancer Neg Hx    Stomach cancer Neg Hx      Past Surgical History:  Procedure Laterality Date   ABDOMINAL AORTAGRAM N/A 12/16/2011   Procedure: ABDOMINAL Ronny Flurry;  Surgeon: Tonny Bollman, MD;  Location: [facility] CATH LAB;  Service: Cardiovascular;  Laterality: N/A;   ABDOMINAL AORTOGRAM W/LOWER EXTREMITY Right 02/29/2020   Procedure: ABDOMINAL AORTOGRAM W/LOWER EXTREMITY;  Surgeon: Runell Gess, MD;  Location: [facility] INVASIVE CV LAB;  Service: Cardiovascular;  Laterality: Right;   ANGIOPLASTY / STENTING ILIAC     Left external Iliac Artery   AORTA - BILATERAL FEMORAL ARTERY BYPASS GRAFT  01/18/2012   Procedure: AORTA BIFEMORAL BYPASS GRAFT;  Surgeon: Gretta Began, MD;  Location: [facility] OR;  Service: Vascular;  Laterality: N/A;   AORTIC VALVE REPLACEMENT  ~ 2004   ATRIAL FIBRILLATION ABLATION N/A 11/20/2020   Procedure: ATRIAL FIBRILLATION ABLATION;  Surgeon: Regan Lemming, MD;  Location: [facility] INVASIVE CV LAB;  Service: Cardiovascular;  Laterality: N/A;   BRONCHIAL BIOPSY  01/28/2021   Procedure: BRONCHIAL BIOPSIES;  Surgeon: Josephine Igo, DO;  Location: [facility] ENDOSCOPY;  Service: Pulmonary;;   BRONCHIAL BRUSHINGS  01/28/2021   Procedure: BRONCHIAL BRUSHINGS;  Surgeon: Josephine Igo, DO;  Location: [facility] ENDOSCOPY;  Service: Pulmonary;;   BRONCHIAL NEEDLE ASPIRATION BIOPSY  01/28/2021   Procedure: BRONCHIAL NEEDLE ASPIRATION BIOPSIES;  Surgeon: Josephine Igo, DO;  Location: [facility] ENDOSCOPY;  Service: Pulmonary;;   BRONCHIAL WASHINGS  01/28/2021   Procedure: BRONCHIAL WASHINGS;  Surgeon: Josephine Igo, DO;  Location: [facility] ENDOSCOPY;  Service: Pulmonary;;   CARDIAC  CATHETERIZATION  11/2003   /pt report 10/01/2016   CARDIAC VALVE REPLACEMENT  11/2003   aortic   CARDIOVERSION N/A 10/16/2019   Procedure: CARDIOVERSION;  Surgeon: Laurey Morale, MD;  Location: [facility] ENDOSCOPY;  Service: Cardiovascular;  Laterality: N/A;   CARDIOVERSION N/A 01/01/2021   Procedure: CARDIOVERSION;  Surgeon: Laurey Morale, MD;  Location: [facility] ENDOSCOPY;  Service: Cardiovascular;  Laterality: N/A;   CATARACT EXTRACTION W/ INTRAOCULAR LENS  IMPLANT, BILATERAL Bilateral    COLONOSCOPY     COLONOSCOPY WITH PROPOFOL N/A 09/03/2019   Procedure: COLONOSCOPY WITH PROPOFOL;  Surgeon: Sherrilyn Rist, MD;  Location: [facility] ENDOSCOPY;  Service: Gastroenterology;  Laterality: N/A;   CORONARY ARTERY BYPASS GRAFT  11/2003   Hattie Perch 04/21/2011  CORONARY ATHERECTOMY N/A 10/10/2019   Procedure: CORONARY ATHERECTOMY;  Surgeon: Tonny Bollman, MD;  Location: [facility] INVASIVE CV LAB;  Service: Cardiovascular;  Laterality: N/A;   CORONARY STENT INTERVENTION N/A 10/10/2019   Procedure: CORONARY STENT INTERVENTION;  Surgeon: Tonny Bollman, MD;  Location: [facility] INVASIVE CV LAB;  Service: Cardiovascular;  Laterality: N/A;   CORONARY/GRAFT ANGIOGRAPHY N/A 09/18/2019   Procedure: CORONARY/GRAFT ANGIOGRAPHY;  Surgeon: Runell Gess, MD;  Location: [facility] INVASIVE CV LAB;  Service: Cardiovascular;  Laterality: N/A;   ESOPHAGOGASTRODUODENOSCOPY (EGD) WITH PROPOFOL N/A 02/27/2019   Procedure: ESOPHAGOGASTRODUODENOSCOPY (EGD) WITH PROPOFOL;  Surgeon: Sherrilyn Rist, MD;  Location: [facility] ENDOSCOPY;  Service: Endoscopy;  Laterality: N/A;   FIDUCIAL MARKER PLACEMENT  01/28/2021   Procedure: FIDUCIAL MARKER PLACEMENT;  Surgeon: Josephine Igo, DO;  Location: [facility] ENDOSCOPY;  Service: Pulmonary;;   HEMOSTASIS CLIP PLACEMENT  09/03/2019   Procedure: HEMOSTASIS CLIP PLACEMENT;  Surgeon: Sherrilyn Rist, MD;  Location: [facility] ENDOSCOPY;  Service: Gastroenterology;;   LOWER EXTREMITY ANGIOGRAM N/A 02/21/2015   Procedure: LOWER  EXTREMITY ANGIOGRAM;  Surgeon: Runell Gess, MD;  Location: [facility] CATH LAB;  Service: Cardiovascular;  Laterality: N/A;   PERIPHERAL VASCULAR CATHETERIZATION N/A 04/22/2015   Procedure: Lower Extremity Angiography;  Surgeon: Runell Gess, MD;  Location: [facility] INVASIVE CV LAB;  Service: Cardiovascular;  Laterality: N/A;   PERIPHERAL VASCULAR CATHETERIZATION N/A 08/24/2016   Procedure: Lower Extremity Angiography;  Surgeon: Runell Gess, MD;  Location: [facility] INVASIVE CV LAB;  Service: Cardiovascular;  Laterality: N/A;   PERIPHERAL VASCULAR CATHETERIZATION Right 10/01/2016   Procedure: Peripheral Vascular Intervention - STENT;  Surgeon: Runell Gess, MD;  Location: [facility] INVASIVE CV LAB;  Service: Cardiovascular;  Laterality: Right;  Prox and MID SFA    PERIPHERAL VASCULAR INTERVENTION  02/29/2020   Procedure: PERIPHERAL VASCULAR INTERVENTION;  Surgeon: Runell Gess, MD;  Location: [facility] INVASIVE CV LAB;  Service: Cardiovascular;;  Right SFA   POLYPECTOMY     RENAL ANGIOGRAM N/A 02/21/2015   Procedure: RENAL ANGIOGRAM;  Surgeon: Runell Gess, MD;  Location: [facility] CATH LAB;  Service: Cardiovascular;  Laterality: Bilateral; 6 mm x 12 mm long Herculink balloon expandable stent to the left renal artery   RENAL ARTERY STENT Left 04/22/2015   dr berry   SPLENECTOMY  02/2003   Hattie Perch 04/21/2011   TEE WITHOUT CARDIOVERSION N/A 10/16/2019   Procedure: TRANSESOPHAGEAL ECHOCARDIOGRAM (TEE);  Surgeon: Laurey Morale, MD;  Location: [facility] ENDOSCOPY;  Service: Cardiovascular;  Laterality: N/A;   TEMPORARY PACEMAKER N/A 10/10/2019   Procedure: TEMPORARY PACEMAKER;  Surgeon: Tonny Bollman, MD;  Location: [facility] INVASIVE CV LAB;  Service: Cardiovascular;  Laterality: N/A;   TONSILLECTOMY  ~ 1952   VIDEO BRONCHOSCOPY WITH ENDOBRONCHIAL NAVIGATION N/A 01/28/2021   Procedure: VIDEO BRONCHOSCOPY WITH ENDOBRONCHIAL NAVIGATION;  Surgeon: Josephine Igo, DO;  Location: [facility] ENDOSCOPY;  Service: Pulmonary;  Laterality: N/A;     Social History   Socioeconomic History   Marital status: Divorced    Spouse name: Not on file   Number of children: 3   Years of education: Not on file   Highest education level: Not on file  Occupational History   Occupation: Retired  Tobacco Use   Smoking status: Former    Years: 30    Types: Cigarettes, Cigars    Quit date: 12/15/1993    Years since quitting: 29.2   Smokeless tobacco: Never   Tobacco comments:    occasional cigar  Vaping Use  Vaping Use: Never used  Substance and Sexual Activity   Alcohol use: Yes    Alcohol/week: 16.0 standard drinks of alcohol    Types: 1 Cans of beer, 1 Shots of liquor, 14 Standard drinks or equivalent per week    Comment: drinks 2 martini's a night (2 shots in each)   Drug use: No   Sexual activity: Yes  Other Topics Concern   Not on file  Social History Narrative   Tries to remain active.  Frequent golfer but claudication limits this.   Social Determinants of Health   Financial Resource Strain: Not on file  Food Insecurity: Not on file  Transportation Needs: Not on file  Physical Activity: Not on file  Stress: Not on file  Social Connections: Not on file  Intimate Partner Violence: Not on file     No Known Allergies   Outpatient Medications Prior to Visit  Medication Sig Dispense Refill   atorvastatin (LIPITOR) 80 MG tablet Take 80 mg by mouth daily.     Blood Glucose Monitoring Suppl (ONETOUCH VERIO IQ SYSTEM) w/Device KIT      carvedilol (COREG) 3.125 MG tablet Take 1 tablet (3.125 mg total) by mouth 2 (two) times daily. 180 tablet 3   enoxaparin (LOVENOX) 80 MG/0.8ML injection Inject 0.8 mLs (80 mg total) into the skin every 12 (twelve) hours. As instructed by Coumadin Clinic 8 mL 0   ENTRESTO 24-26 MG TAKE 1 TABLET BY MOUTH TWICE A DAY 60 tablet 6   Evolocumab 140 MG/ML SOAJ Inject 140 mg into the skin every 14 (fourteen) days.     furosemide (LASIX) 20 MG tablet 60 mg daily alternating with 40 mg daily. 180  tablet 3   glucose blood (ONETOUCH VERIO) test strip      glyBURIDE (DIABETA) 2.5 MG tablet Take 5 mg by mouth 2 (two) times daily with a meal.     INVOKANA 100 MG TABS tablet Take 100 mg by mouth daily.     nitroGLYCERIN (NITROSTAT) 0.4 MG SL tablet Place 1 tablet (0.4 mg total) under the tongue every 5 (five) minutes x 3 doses as needed for chest pain. 25 tablet 12   OneTouch Delica Lancets 33G MISC      sildenafil (VIAGRA) 100 MG tablet Take 100 mg by mouth as needed.     spironolactone (ALDACTONE) 25 MG tablet TAKE 1 TABLET BY MOUTH EVERY DAY 90 tablet 3   warfarin (COUMADIN) 5 MG tablet TAKE 1 TO 1 AND 1/2 TABLETS BY MOUTH DAILY AS PRESCRIBED BY THE COUMADIN CLINIC. 120 tablet 1   No facility-administered medications prior to visit.    Review of Systems  Constitutional:  Negative for chills, fever, malaise/fatigue and weight loss.  HENT:  Negative for hearing loss, sore throat and tinnitus.   Eyes:  Negative for blurred vision and double vision.  Respiratory:  Positive for cough, hemoptysis and shortness of breath. Negative for sputum production, wheezing and stridor.   Cardiovascular:  Negative for chest pain, palpitations, orthopnea, leg swelling and PND.  Gastrointestinal:  Negative for abdominal pain, constipation, diarrhea, heartburn, nausea and vomiting.  Genitourinary:  Negative for dysuria, hematuria and urgency.  Musculoskeletal:  Negative for joint pain and myalgias.  Skin:  Negative for itching and rash.  Neurological:  Negative for dizziness, tingling, weakness and headaches.  Endo/Heme/Allergies:  Negative for environmental allergies. Does not bruise/bleed easily.  Psychiatric/Behavioral:  Negative for depression. The patient is not nervous/anxious and does not have insomnia.   All other  systems reviewed and are negative.    Objective:  Physical Exam Vitals reviewed.  Constitutional:      General: He is not in acute distress.    Appearance: He is well-developed.   HENT:     Head: Normocephalic and atraumatic.  Eyes:     General: No scleral icterus.    Conjunctiva/sclera: Conjunctivae normal.     Pupils: Pupils are equal, round, and reactive to light.  Neck:     Vascular: No JVD.     Trachea: No tracheal deviation.  Cardiovascular:     Rate and Rhythm: Normal rate and regular rhythm.     Heart sounds: Normal heart sounds. No murmur heard. Pulmonary:     Effort: Pulmonary effort is normal. No tachypnea, accessory muscle usage or respiratory distress.     Breath sounds: No stridor. No wheezing, rhonchi or rales.  Abdominal:     General: There is no distension.     Palpations: Abdomen is soft.     Tenderness: There is no abdominal tenderness.  Musculoskeletal:        General: No tenderness.     Cervical back: Neck supple.  Lymphadenopathy:     Cervical: No cervical adenopathy.  Skin:    General: Skin is warm and dry.     Capillary Refill: Capillary refill takes less than 2 seconds.     Findings: No rash.  Neurological:     Mental Status: He is alert and oriented to person, place, and time.  Psychiatric:        Behavior: Behavior normal.      Vitals:   03/17/23 1049  BP: 120/60  Pulse: (!) 57  SpO2: 96%  Weight: 172 lb 3.2 oz (78.1 kg)  Height: 5\' 8"  (1.727 m)   96% on  RA BMI Readings from Last 3 Encounters:  03/17/23 26.18 kg/m  02/25/23 26.94 kg/m  01/18/23 25.70 kg/m   Wt Readings from Last 3 Encounters:  03/17/23 172 lb 3.2 oz (78.1 kg)  02/25/23 177 lb 3.2 oz (80.4 kg)  01/18/23 169 lb (76.7 kg)     CBC    Component Value Date/Time   WBC 8.1 02/25/2023 1204   RBC 5.42 02/25/2023 1204   HGB 17.0 02/25/2023 1204   HGB 15.9 11/04/2020 1529   HCT 49.2 02/25/2023 1204   HCT 47.4 11/04/2020 1529   PLT 215 02/25/2023 1204   PLT 216 11/04/2020 1529   MCV 90.8 02/25/2023 1204   MCV 95 11/04/2020 1529   MCH 31.4 02/25/2023 1204   MCHC 34.6 02/25/2023 1204   RDW 15.6 (H) 02/25/2023 1204   RDW 13.3 11/04/2020  1529   LYMPHSABS 2.0 12/07/2020 2356   MONOABS 1.4 (H) 12/07/2020 2356   EOSABS 0.1 12/07/2020 2356   BASOSABS 0.1 12/07/2020 2356    Chest Imaging: 12/08/2020 CT chest: 11 x 16 mm lingular lung nodule concerning for primary bronchogenic carcinoma with lobulated edges. The patient's images have been independently reviewed by me.    12/13/2020 nuclear medicine PET/CT: A 15 mm lingular nodule SUV max 2.3 concerning for primary bronchogenic carcinoma. The patient's images have been independently reviewed by me.    April 2024 CT chest: Progressive infiltrate within the left lower lobe.  Look to serial imaging over the past 8 months.  He has already been treated with antibiotics for pneumonia. The patient's images have been independently reviewed by me.    Pulmonary Functions Testing Results:     No data to display  FeNO:   Pathology:   Echocardiogram:   Heart Catheterization:     Assessment & Plan:     ICD-10-CM   1. Nodule of upper lobe of left lung  R91.1     2. Cancer of lingula of lung  C34.10     3. Squamous cell carcinoma of left lung  C34.92     4. Centrilobular emphysema  J43.2     5. Former smoker  Z87.891     6. Pulmonary infiltrate  R91.8     7. H/O mechanical aortic valve replacement  Z95.2     8. Encounter for monitoring Coumadin therapy  Z51.81    Z79.01        Discussion:  [name], severe coronary disease, history of AVR on Coumadin, heart failure on Entresto.  Underwent previous navigational bronchoscopy for a stage I primary bronchogenic carcinoma.  Has SBRT treatments and has done well since then.  He has an area of scarring in his lingula that has remained persistent since radiation treatments but now has a progressive infiltrate in the left lower lobe.  And intermittent bouts of small hemoptysis.  Plan: Since he has had a nonresolving infiltrate in the left lower lobe with associated hemoptysis the concern is  underlying DAH. It is possible that this is related to his ongoing anticoagulation with Coumadin.  This makes it very difficult situation as he needs to be anticoagulated due to his AVR.  I have discussed case with radiation oncology as well as one of my partners.  I am working nights this week and would like to get this procedure completed in a timely fashion.  Dr. Katrinka Blazing is willing to do bronchoscopy with BAL serial BAL's to rule out DAH. We will need to send specimens for culture, respiratory culture, fungus and AFB.  We also need to send for cell count analysis with differential.  Hopefully this will glean some more information from the left lower lobe.  Tentative bronchoscopy date will be on Friday of this week.    Current Outpatient Medications:    atorvastatin (LIPITOR) 80 MG tablet, Take 80 mg by mouth daily., Disp: , Rfl:    Blood Glucose Monitoring Suppl (ONETOUCH VERIO IQ SYSTEM) w/Device KIT, , Disp: , Rfl:    carvedilol (COREG) 3.125 MG tablet, Take 1 tablet (3.125 mg total) by mouth 2 (two) times daily., Disp: 180 tablet, Rfl: 3   enoxaparin (LOVENOX) 80 MG/0.8ML injection, Inject 0.8 mLs (80 mg total) into the skin every 12 (twelve) hours. As instructed by Coumadin Clinic, Disp: 8 mL, Rfl: 0   ENTRESTO 24-26 MG, TAKE 1 TABLET BY MOUTH TWICE A DAY, Disp: 60 tablet, Rfl: 6   Evolocumab 140 MG/ML SOAJ, Inject 140 mg into the skin every 14 (fourteen) days., Disp: , Rfl:    furosemide (LASIX) 20 MG tablet, 60 mg daily alternating with 40 mg daily., Disp: 180 tablet, Rfl: 3   glucose blood (ONETOUCH VERIO) test strip, , Disp: , Rfl:    glyBURIDE (DIABETA) 2.5 MG tablet, Take 5 mg by mouth 2 (two) times daily with a meal., Disp: , Rfl:    INVOKANA 100 MG TABS tablet, Take 100 mg by mouth daily., Disp: , Rfl:    nitroGLYCERIN (NITROSTAT) 0.4 MG SL tablet, Place 1 tablet (0.4 mg total) under the tongue every 5 (five) minutes x 3 doses as needed for chest pain., Disp: 25 tablet, Rfl: 12    OneTouch Delica Lancets 33G MISC, , Disp: ,  Rfl:    sildenafil (VIAGRA) 100 MG tablet, Take 100 mg by mouth as needed., Disp: , Rfl:    spironolactone (ALDACTONE) 25 MG tablet, TAKE 1 TABLET BY MOUTH EVERY DAY, Disp: 90 tablet, Rfl: 3   warfarin (COUMADIN) 5 MG tablet, TAKE 1 TO 1 AND 1/2 TABLETS BY MOUTH DAILY AS PRESCRIBED BY THE COUMADIN CLINIC., Disp: 120 tablet, Rfl: 1   Josephine Igo, DO [facility] 03/17/2023 11:12 AM

## 2023-03-17 NOTE — H&P (View-Only) (Signed)
Synopsis: Referred in January 2021 for PET avid lung nodule by Creola Corn, MD  Subjective:   PATIENT ID: [name] [name] GENDER: male DOB: 23-Jan-1946, MRN: [id number]  Chief Complaint  Patient presents with   Follow-up    F/up, possible pneu.    [name] past medical history of aortic stenosis, mechanical Saint Jude AVR on Coumadin, heart failure, COPD, history of coronary artery disease status post CABG, ITP, peripheral arterial disease, TIA, type 2 diabetes.Patient seen today in the office to discuss CT scan results.  Initial CT was completed at the beginning of January which revealed a 1.5 cm lingular nodule.  Patient had a PET scan completed which revealed an SUV max of 2.311 x 16 mm lingular nodule concerning for primary bronchogenic carcinoma.  Patient has a significant cardiac history.  Last week was taken to endoscopy for cardioversion.  By Dr. Shirlee Latch.  Currently anticoagulated with Coumadin.  History of AVR.  He has no complaints today from a respiratory standpoint.  Former smoker.  Denies hemoptysis weight loss fatigue.  OV 02/19/2021: Here today for follow-up after bronchoscopy.  Cytology from bronchoscopy was positive for a squamous cell carcinoma.  Patient was referred to radiation oncology.  Met with Dr. Mauricio Po on 02/06/2021.  Office notes reviewed today.  Patient was planned for SBRT for stage Ia 2 lung cancer.  Patient had his first treatment yesterday.  He did well with this.  Plan for 3 treatments of SBRT with follow-up.  From a respiratory standpoint patient is doing well with no complaints today.  Denies fevers chills night sweats weight loss and hemoptysis.  OV 09/30/2022: Patient here today for follow-up.  He is currently managed by radiation oncology.  He has a serial images completed there.  He is not on any inhalers or medications at this time for breathing.  He has no respiratory complaints today.  This is a 1 year return follow-up.  OV 14  2024: Here today for follow-up after CT imaging.  He has had a persistent abnormal CT scan of the chest that has progressed over the past 8 months with infiltrate in the left lower lobe.  He now has ongoing hemoptysis.  He is on Coumadin for a mechanical Saint Jude valve replacement.  He had SBRT treatments to a lingular nodule 2 years ago his stable CT imaging from that standpoint.  We progression has led to cough and some shortness of breath associated with this he has areas of groundglass and centrilobular septal thickening.    Past Medical History:  Diagnosis Date   Adenomatous colon polyp 09/1997   Anemia    Aortic stenosis    s/p st. jude mechanical AVR - Chronic Coumadin   Blood transfusion    "related to ITP"   CHF (congestive heart failure)    COPD (chronic obstructive pulmonary disease)    patient denies this dx on 01/27/21   Coronary artery disease    s/p cabg x 3 11/2003: lima-lad, seq vg to rpda and rpl   Diverticulitis of colon    Dysrhythmia    a-fib   GERD (gastroesophageal reflux disease)    Heart murmur    History of radiation therapy 02/18/2021, 02/20/2021, 02/25/2021   SBRT to left lung     Dr Antony Blackbird   Hyperlipidemia    Hypertension    ITP (idiopathic thrombocytopenic purpura)    Lung cancer    last week   Peripheral arterial disease    a.  history of aortobifemoral bypass grafting by Dr. Tawanna Cooler early b. LE angiography 04/22/2015 patent aortobifem graft, DES to R SFA   Peripheral vascular disease    s/p Left external Iliac Artery stenting and subsequent left femoral endarterectomy 02/2011- post op course complicated by wound infxn req I&D 03/2011   Renal artery stenosis, native, bilateral    a. bilateral renal artery stenosis by recent duplex ultrasound b. L renal artery stent 02/2015, R renal artery patent on angiogram   Stroke 2015   TIA (transient ischemic attack) ~ 2013   Type II diabetes mellitus      Family History  Problem Relation Age of Onset    Coronary artery disease Mother        bypass surgery - deceased   Heart disease Father        murmur, valve replacement - deceased   Breast cancer Sister    Diabetes Other        grandmother   Diabetes Paternal Grandmother    Diabetes Paternal Aunt    Colon cancer Neg Hx    Colon polyps Neg Hx    Esophageal cancer Neg Hx    Rectal cancer Neg Hx    Stomach cancer Neg Hx      Past Surgical History:  Procedure Laterality Date   ABDOMINAL AORTAGRAM N/A 12/16/2011   Procedure: ABDOMINAL Ronny Flurry;  Surgeon: Tonny Bollman, MD;  Location: [facility] CATH LAB;  Service: Cardiovascular;  Laterality: N/A;   ABDOMINAL AORTOGRAM W/LOWER EXTREMITY Right 02/29/2020   Procedure: ABDOMINAL AORTOGRAM W/LOWER EXTREMITY;  Surgeon: Runell Gess, MD;  Location: [facility] INVASIVE CV LAB;  Service: Cardiovascular;  Laterality: Right;   ANGIOPLASTY / STENTING ILIAC     Left external Iliac Artery   AORTA - BILATERAL FEMORAL ARTERY BYPASS GRAFT  01/18/2012   Procedure: AORTA BIFEMORAL BYPASS GRAFT;  Surgeon: Gretta Began, MD;  Location: [facility] OR;  Service: Vascular;  Laterality: N/A;   AORTIC VALVE REPLACEMENT  ~ 2004   ATRIAL FIBRILLATION ABLATION N/A 11/20/2020   Procedure: ATRIAL FIBRILLATION ABLATION;  Surgeon: Regan Lemming, MD;  Location: [facility] INVASIVE CV LAB;  Service: Cardiovascular;  Laterality: N/A;   BRONCHIAL BIOPSY  01/28/2021   Procedure: BRONCHIAL BIOPSIES;  Surgeon: Josephine Igo, DO;  Location: [facility] ENDOSCOPY;  Service: Pulmonary;;   BRONCHIAL BRUSHINGS  01/28/2021   Procedure: BRONCHIAL BRUSHINGS;  Surgeon: Josephine Igo, DO;  Location: [facility] ENDOSCOPY;  Service: Pulmonary;;   BRONCHIAL NEEDLE ASPIRATION BIOPSY  01/28/2021   Procedure: BRONCHIAL NEEDLE ASPIRATION BIOPSIES;  Surgeon: Josephine Igo, DO;  Location: [facility] ENDOSCOPY;  Service: Pulmonary;;   BRONCHIAL WASHINGS  01/28/2021   Procedure: BRONCHIAL WASHINGS;  Surgeon: Josephine Igo, DO;  Location: [facility] ENDOSCOPY;  Service: Pulmonary;;   CARDIAC  CATHETERIZATION  11/2003   /pt report 10/01/2016   CARDIAC VALVE REPLACEMENT  11/2003   aortic   CARDIOVERSION N/A 10/16/2019   Procedure: CARDIOVERSION;  Surgeon: Laurey Morale, MD;  Location: [facility] ENDOSCOPY;  Service: Cardiovascular;  Laterality: N/A;   CARDIOVERSION N/A 01/01/2021   Procedure: CARDIOVERSION;  Surgeon: Laurey Morale, MD;  Location: [facility] ENDOSCOPY;  Service: Cardiovascular;  Laterality: N/A;   CATARACT EXTRACTION W/ INTRAOCULAR LENS  IMPLANT, BILATERAL Bilateral    COLONOSCOPY     COLONOSCOPY WITH PROPOFOL N/A 09/03/2019   Procedure: COLONOSCOPY WITH PROPOFOL;  Surgeon: Sherrilyn Rist, MD;  Location: [facility] ENDOSCOPY;  Service: Gastroenterology;  Laterality: N/A;   CORONARY ARTERY BYPASS GRAFT  11/2003   Hattie Perch 04/21/2011  CORONARY ATHERECTOMY N/A 10/10/2019   Procedure: CORONARY ATHERECTOMY;  Surgeon: Tonny Bollman, MD;  Location: [facility] INVASIVE CV LAB;  Service: Cardiovascular;  Laterality: N/A;   CORONARY STENT INTERVENTION N/A 10/10/2019   Procedure: CORONARY STENT INTERVENTION;  Surgeon: Tonny Bollman, MD;  Location: [facility] INVASIVE CV LAB;  Service: Cardiovascular;  Laterality: N/A;   CORONARY/GRAFT ANGIOGRAPHY N/A 09/18/2019   Procedure: CORONARY/GRAFT ANGIOGRAPHY;  Surgeon: Runell Gess, MD;  Location: [facility] INVASIVE CV LAB;  Service: Cardiovascular;  Laterality: N/A;   ESOPHAGOGASTRODUODENOSCOPY (EGD) WITH PROPOFOL N/A 02/27/2019   Procedure: ESOPHAGOGASTRODUODENOSCOPY (EGD) WITH PROPOFOL;  Surgeon: Sherrilyn Rist, MD;  Location: [facility] ENDOSCOPY;  Service: Endoscopy;  Laterality: N/A;   FIDUCIAL MARKER PLACEMENT  01/28/2021   Procedure: FIDUCIAL MARKER PLACEMENT;  Surgeon: Josephine Igo, DO;  Location: [facility] ENDOSCOPY;  Service: Pulmonary;;   HEMOSTASIS CLIP PLACEMENT  09/03/2019   Procedure: HEMOSTASIS CLIP PLACEMENT;  Surgeon: Sherrilyn Rist, MD;  Location: [facility] ENDOSCOPY;  Service: Gastroenterology;;   LOWER EXTREMITY ANGIOGRAM N/A 02/21/2015   Procedure: LOWER  EXTREMITY ANGIOGRAM;  Surgeon: Runell Gess, MD;  Location: [facility] CATH LAB;  Service: Cardiovascular;  Laterality: N/A;   PERIPHERAL VASCULAR CATHETERIZATION N/A 04/22/2015   Procedure: Lower Extremity Angiography;  Surgeon: Runell Gess, MD;  Location: [facility] INVASIVE CV LAB;  Service: Cardiovascular;  Laterality: N/A;   PERIPHERAL VASCULAR CATHETERIZATION N/A 08/24/2016   Procedure: Lower Extremity Angiography;  Surgeon: Runell Gess, MD;  Location: [facility] INVASIVE CV LAB;  Service: Cardiovascular;  Laterality: N/A;   PERIPHERAL VASCULAR CATHETERIZATION Right 10/01/2016   Procedure: Peripheral Vascular Intervention - STENT;  Surgeon: Runell Gess, MD;  Location: [facility] INVASIVE CV LAB;  Service: Cardiovascular;  Laterality: Right;  Prox and MID SFA    PERIPHERAL VASCULAR INTERVENTION  02/29/2020   Procedure: PERIPHERAL VASCULAR INTERVENTION;  Surgeon: Runell Gess, MD;  Location: [facility] INVASIVE CV LAB;  Service: Cardiovascular;;  Right SFA   POLYPECTOMY     RENAL ANGIOGRAM N/A 02/21/2015   Procedure: RENAL ANGIOGRAM;  Surgeon: Runell Gess, MD;  Location: [facility] CATH LAB;  Service: Cardiovascular;  Laterality: Bilateral; 6 mm x 12 mm long Herculink balloon expandable stent to the left renal artery   RENAL ARTERY STENT Left 04/22/2015   dr berry   SPLENECTOMY  02/2003   Hattie Perch 04/21/2011   TEE WITHOUT CARDIOVERSION N/A 10/16/2019   Procedure: TRANSESOPHAGEAL ECHOCARDIOGRAM (TEE);  Surgeon: Laurey Morale, MD;  Location: [facility] ENDOSCOPY;  Service: Cardiovascular;  Laterality: N/A;   TEMPORARY PACEMAKER N/A 10/10/2019   Procedure: TEMPORARY PACEMAKER;  Surgeon: Tonny Bollman, MD;  Location: [facility] INVASIVE CV LAB;  Service: Cardiovascular;  Laterality: N/A;   TONSILLECTOMY  ~ 1952   VIDEO BRONCHOSCOPY WITH ENDOBRONCHIAL NAVIGATION N/A 01/28/2021   Procedure: VIDEO BRONCHOSCOPY WITH ENDOBRONCHIAL NAVIGATION;  Surgeon: Josephine Igo, DO;  Location: [facility] ENDOSCOPY;  Service: Pulmonary;  Laterality: N/A;     Social History   Socioeconomic History   Marital status: Divorced    Spouse name: Not on file   Number of children: 3   Years of education: Not on file   Highest education level: Not on file  Occupational History   Occupation: Retired  Tobacco Use   Smoking status: Former    Years: 30    Types: Cigarettes, Cigars    Quit date: 12/15/1993    Years since quitting: 29.2   Smokeless tobacco: Never   Tobacco comments:    occasional cigar  Vaping Use  Vaping Use: Never used  Substance and Sexual Activity   Alcohol use: Yes    Alcohol/week: 16.0 standard drinks of alcohol    Types: 1 Cans of beer, 1 Shots of liquor, 14 Standard drinks or equivalent per week    Comment: drinks 2 martini's a night (2 shots in each)   Drug use: No   Sexual activity: Yes  Other Topics Concern   Not on file  Social History Narrative   Tries to remain active.  Frequent golfer but claudication limits this.   Social Determinants of Health   Financial Resource Strain: Not on file  Food Insecurity: Not on file  Transportation Needs: Not on file  Physical Activity: Not on file  Stress: Not on file  Social Connections: Not on file  Intimate Partner Violence: Not on file     No Known Allergies   Outpatient Medications Prior to Visit  Medication Sig Dispense Refill   atorvastatin (LIPITOR) 80 MG tablet Take 80 mg by mouth daily.     Blood Glucose Monitoring Suppl (ONETOUCH VERIO IQ SYSTEM) w/Device KIT      carvedilol (COREG) 3.125 MG tablet Take 1 tablet (3.125 mg total) by mouth 2 (two) times daily. 180 tablet 3   enoxaparin (LOVENOX) 80 MG/0.8ML injection Inject 0.8 mLs (80 mg total) into the skin every 12 (twelve) hours. As instructed by Coumadin Clinic 8 mL 0   ENTRESTO 24-26 MG TAKE 1 TABLET BY MOUTH TWICE A DAY 60 tablet 6   Evolocumab 140 MG/ML SOAJ Inject 140 mg into the skin every 14 (fourteen) days.     furosemide (LASIX) 20 MG tablet 60 mg daily alternating with 40 mg daily. 180  tablet 3   glucose blood (ONETOUCH VERIO) test strip      glyBURIDE (DIABETA) 2.5 MG tablet Take 5 mg by mouth 2 (two) times daily with a meal.     INVOKANA 100 MG TABS tablet Take 100 mg by mouth daily.     nitroGLYCERIN (NITROSTAT) 0.4 MG SL tablet Place 1 tablet (0.4 mg total) under the tongue every 5 (five) minutes x 3 doses as needed for chest pain. 25 tablet 12   OneTouch Delica Lancets 33G MISC      sildenafil (VIAGRA) 100 MG tablet Take 100 mg by mouth as needed.     spironolactone (ALDACTONE) 25 MG tablet TAKE 1 TABLET BY MOUTH EVERY DAY 90 tablet 3   warfarin (COUMADIN) 5 MG tablet TAKE 1 TO 1 AND 1/2 TABLETS BY MOUTH DAILY AS PRESCRIBED BY THE COUMADIN CLINIC. 120 tablet 1   No facility-administered medications prior to visit.    Review of Systems  Constitutional:  Negative for chills, fever, malaise/fatigue and weight loss.  HENT:  Negative for hearing loss, sore throat and tinnitus.   Eyes:  Negative for blurred vision and double vision.  Respiratory:  Positive for cough, hemoptysis and shortness of breath. Negative for sputum production, wheezing and stridor.   Cardiovascular:  Negative for chest pain, palpitations, orthopnea, leg swelling and PND.  Gastrointestinal:  Negative for abdominal pain, constipation, diarrhea, heartburn, nausea and vomiting.  Genitourinary:  Negative for dysuria, hematuria and urgency.  Musculoskeletal:  Negative for joint pain and myalgias.  Skin:  Negative for itching and rash.  Neurological:  Negative for dizziness, tingling, weakness and headaches.  Endo/Heme/Allergies:  Negative for environmental allergies. Does not bruise/bleed easily.  Psychiatric/Behavioral:  Negative for depression. The patient is not nervous/anxious and does not have insomnia.   All other  systems reviewed and are negative.    Objective:  Physical Exam Vitals reviewed.  Constitutional:      General: He is not in acute distress.    Appearance: He is well-developed.   HENT:     Head: Normocephalic and atraumatic.  Eyes:     General: No scleral icterus.    Conjunctiva/sclera: Conjunctivae normal.     Pupils: Pupils are equal, round, and reactive to light.  Neck:     Vascular: No JVD.     Trachea: No tracheal deviation.  Cardiovascular:     Rate and Rhythm: Normal rate and regular rhythm.     Heart sounds: Normal heart sounds. No murmur heard. Pulmonary:     Effort: Pulmonary effort is normal. No tachypnea, accessory muscle usage or respiratory distress.     Breath sounds: No stridor. No wheezing, rhonchi or rales.  Abdominal:     General: There is no distension.     Palpations: Abdomen is soft.     Tenderness: There is no abdominal tenderness.  Musculoskeletal:        General: No tenderness.     Cervical back: Neck supple.  Lymphadenopathy:     Cervical: No cervical adenopathy.  Skin:    General: Skin is warm and dry.     Capillary Refill: Capillary refill takes less than 2 seconds.     Findings: No rash.  Neurological:     Mental Status: He is alert and oriented to person, place, and time.  Psychiatric:        Behavior: Behavior normal.      Vitals:   03/17/23 1049  BP: 120/60  Pulse: (!) 57  SpO2: 96%  Weight: 172 lb 3.2 oz (78.1 kg)  Height: 5\' 8"  (1.727 m)   96% on  RA BMI Readings from Last 3 Encounters:  03/17/23 26.18 kg/m  02/25/23 26.94 kg/m  01/18/23 25.70 kg/m   Wt Readings from Last 3 Encounters:  03/17/23 172 lb 3.2 oz (78.1 kg)  02/25/23 177 lb 3.2 oz (80.4 kg)  01/18/23 169 lb (76.7 kg)     CBC    Component Value Date/Time   WBC 8.1 02/25/2023 1204   RBC 5.42 02/25/2023 1204   HGB 17.0 02/25/2023 1204   HGB 15.9 11/04/2020 1529   HCT 49.2 02/25/2023 1204   HCT 47.4 11/04/2020 1529   PLT 215 02/25/2023 1204   PLT 216 11/04/2020 1529   MCV 90.8 02/25/2023 1204   MCV 95 11/04/2020 1529   MCH 31.4 02/25/2023 1204   MCHC 34.6 02/25/2023 1204   RDW 15.6 (H) 02/25/2023 1204   RDW 13.3 11/04/2020  1529   LYMPHSABS 2.0 12/07/2020 2356   MONOABS 1.4 (H) 12/07/2020 2356   EOSABS 0.1 12/07/2020 2356   BASOSABS 0.1 12/07/2020 2356    Chest Imaging: 12/08/2020 CT chest: 11 x 16 mm lingular lung nodule concerning for primary bronchogenic carcinoma with lobulated edges. The patient's images have been independently reviewed by me.    12/13/2020 nuclear medicine PET/CT: A 15 mm lingular nodule SUV max 2.3 concerning for primary bronchogenic carcinoma. The patient's images have been independently reviewed by me.    April 2024 CT chest: Progressive infiltrate within the left lower lobe.  Look to serial imaging over the past 8 months.  He has already been treated with antibiotics for pneumonia. The patient's images have been independently reviewed by me.    Pulmonary Functions Testing Results:     No data to display  FeNO:   Pathology:   Echocardiogram:   Heart Catheterization:     Assessment & Plan:     ICD-10-CM   1. Nodule of upper lobe of left lung  R91.1     2. Cancer of lingula of lung  C34.10     3. Squamous cell carcinoma of left lung  C34.92     4. Centrilobular emphysema  J43.2     5. Former smoker  Z87.891     6. Pulmonary infiltrate  R91.8     7. H/O mechanical aortic valve replacement  Z95.2     8. Encounter for monitoring Coumadin therapy  Z51.81    Z79.01        Discussion:  [name], severe coronary disease, history of AVR on Coumadin, heart failure on Entresto.  Underwent previous navigational bronchoscopy for a stage I primary bronchogenic carcinoma.  Has SBRT treatments and has done well since then.  He has an area of scarring in his lingula that has remained persistent since radiation treatments but now has a progressive infiltrate in the left lower lobe.  And intermittent bouts of small hemoptysis.  Plan: Since he has had a nonresolving infiltrate in the left lower lobe with associated hemoptysis the concern is  underlying DAH. It is possible that this is related to his ongoing anticoagulation with Coumadin.  This makes it very difficult situation as he needs to be anticoagulated due to his AVR.  I have discussed case with radiation oncology as well as one of my partners.  I am working nights this week and would like to get this procedure completed in a timely fashion.  Dr. Katrinka Blazing is willing to do bronchoscopy with BAL serial BAL's to rule out DAH. We will need to send specimens for culture, respiratory culture, fungus and AFB.  We also need to send for cell count analysis with differential.  Hopefully this will glean some more information from the left lower lobe.  Tentative bronchoscopy date will be on Friday of this week.    Current Outpatient Medications:    atorvastatin (LIPITOR) 80 MG tablet, Take 80 mg by mouth daily., Disp: , Rfl:    Blood Glucose Monitoring Suppl (ONETOUCH VERIO IQ SYSTEM) w/Device KIT, , Disp: , Rfl:    carvedilol (COREG) 3.125 MG tablet, Take 1 tablet (3.125 mg total) by mouth 2 (two) times daily., Disp: 180 tablet, Rfl: 3   enoxaparin (LOVENOX) 80 MG/0.8ML injection, Inject 0.8 mLs (80 mg total) into the skin every 12 (twelve) hours. As instructed by Coumadin Clinic, Disp: 8 mL, Rfl: 0   ENTRESTO 24-26 MG, TAKE 1 TABLET BY MOUTH TWICE A DAY, Disp: 60 tablet, Rfl: 6   Evolocumab 140 MG/ML SOAJ, Inject 140 mg into the skin every 14 (fourteen) days., Disp: , Rfl:    furosemide (LASIX) 20 MG tablet, 60 mg daily alternating with 40 mg daily., Disp: 180 tablet, Rfl: 3   glucose blood (ONETOUCH VERIO) test strip, , Disp: , Rfl:    glyBURIDE (DIABETA) 2.5 MG tablet, Take 5 mg by mouth 2 (two) times daily with a meal., Disp: , Rfl:    INVOKANA 100 MG TABS tablet, Take 100 mg by mouth daily., Disp: , Rfl:    nitroGLYCERIN (NITROSTAT) 0.4 MG SL tablet, Place 1 tablet (0.4 mg total) under the tongue every 5 (five) minutes x 3 doses as needed for chest pain., Disp: 25 tablet, Rfl: 12    OneTouch Delica Lancets 33G MISC, , Disp: ,  Rfl:    sildenafil (VIAGRA) 100 MG tablet, Take 100 mg by mouth as needed., Disp: , Rfl:    spironolactone (ALDACTONE) 25 MG tablet, TAKE 1 TABLET BY MOUTH EVERY DAY, Disp: 90 tablet, Rfl: 3   warfarin (COUMADIN) 5 MG tablet, TAKE 1 TO 1 AND 1/2 TABLETS BY MOUTH DAILY AS PRESCRIBED BY THE COUMADIN CLINIC., Disp: 120 tablet, Rfl: 1   Josephine Igo, DO [facility] 03/17/2023 11:12 AM

## 2023-03-18 NOTE — Progress Notes (Signed)
SDW call  VM left for patient on his cell phone and given information re: location, arrival time of 0545, NPO, hold glyburide tonight and in the morning, hold coumadin in the morning, hold invokana in the morning.  He is to take Atorvastatin and carvedilol.  Was able to briefly speak to patients daughter, Amy, who is his emergency contact. She was not aware he was scheduled for surgery and is scrambling trying to find him a ride.  Reviewed the same information with her so she can also review it with him.    Chart is being reviewed by Dr. Hester Mates.

## 2023-03-18 NOTE — Anesthesia Preprocedure Evaluation (Addendum)
Anesthesia Evaluation  Patient identified by MRN, date of birth, ID band Patient awake    Reviewed: Allergy & Precautions, NPO status , Patient's Chart, lab work & pertinent test results  Airway Mallampati: II  TM Distance: >3 FB Neck ROM: Full    Dental no notable dental hx. (+) Teeth Intact, Dental Advisory Given   Pulmonary COPD, former smoker   Pulmonary exam normal breath sounds clear to auscultation       Cardiovascular hypertension, Pt. on medications + CAD, + CABG, + Peripheral Vascular Disease and +CHF  Normal cardiovascular exam+ Valvular Problems/Murmurs (S/P AVR)  Rhythm:Regular Rate:Normal  12/11/2022 TTE 1. Left ventricular ejection fraction, by estimation, is 45 to 50%. The  left ventricle has mildly decreased function. The left ventricle  demonstrates regional wall motion abnormalities (see scoring  diagram/findings for description). Left ventricular  diastolic parameters are consistent with Grade II diastolic dysfunction  (pseudonormalization). Elevated left atrial pressure. There is hypokinesis  of the left ventricular, basal-mid inferolateral wall.   2. Right ventricular systolic function is mildly reduced. The right  ventricular size is normal. Tricuspid regurgitation signal is inadequate  for assessing PA pressure.   3. Left atrial size was mildly dilated.   4. Right atrial size was mildly dilated.   5. The mitral valve is degenerative. Mild mitral valve regurgitation. No  evidence of mitral stenosis. Severe mitral annular calcification.   6. The aortic valve has been repaired/replaced. Aortic valve  regurgitation is not visualized. There is a valve present in the aortic  position. Aortic valve mean gradient measures 6.0 mmHg. Aortic valve Vmax  measures 1.69 m/s. Aortic valve acceleration  time measures 77 msec.   7. The inferior vena cava is dilated in size with >50% respiratory  variability, suggesting  right atrial pressure of 8 mmHg.      Neuro/Psych TIA   GI/Hepatic   Endo/Other  diabetes, Type 2    Renal/GU Renal diseaseLab Results      Component                Value               Date                      CREATININE               1.39 (H)            03/09/2023                BUN                      27 (H)              03/09/2023                NA                       138                 03/09/2023                K                        4.4                 03/09/2023  Musculoskeletal   Abdominal   Peds  Hematology Heprin ITP Lab Results      Component                Value               Date                      WBC                      8.1                 02/25/2023                HGB                      17.0                02/25/2023                HCT                      49.2                02/25/2023                MCV                      90.8                02/25/2023                PLT                      215                 02/25/2023              Anesthesia Other Findings   Reproductive/Obstetrics                             Anesthesia Physical Anesthesia Plan  ASA: 3  Anesthesia Plan: General   Post-op Pain Management: Ofirmev IV (intra-op)*   Induction: Intravenous  PONV Risk Score and Plan: 3 and Treatment may vary due to age or medical condition and Ondansetron  Airway Management Planned: Oral ETT  Additional Equipment: None  Intra-op Plan:   Post-operative Plan: Extubation in OR  Informed Consent: I have reviewed the patients History and Physical, chart, labs and discussed the procedure including the risks, benefits and alternatives for the proposed anesthesia with the patient or authorized representative who has indicated his/her understanding and acceptance.     Dental advisory given  Plan Discussed with:   Anesthesia Plan Comments:         Anesthesia Quick Evaluation

## 2023-03-19 ENCOUNTER — Ambulatory Visit (HOSPITAL_COMMUNITY): Payer: Medicare PPO | Admitting: Anesthesiology

## 2023-03-19 ENCOUNTER — Encounter (HOSPITAL_COMMUNITY)
Admission: RE | Disposition: A | Discharge status: Home / Self Care | Payer: Self-pay | Source: Home / Self Care | Attending: Internal Medicine

## 2023-03-19 ENCOUNTER — Encounter (HOSPITAL_COMMUNITY): Payer: Self-pay | Admitting: Internal Medicine

## 2023-03-19 ENCOUNTER — Ambulatory Visit (HOSPITAL_BASED_OUTPATIENT_CLINIC_OR_DEPARTMENT_OTHER): Payer: Medicare PPO | Admitting: Anesthesiology

## 2023-03-19 ENCOUNTER — Ambulatory Visit (HOSPITAL_COMMUNITY)
Admission: RE | Admit: 2023-03-19 | Discharge: 2023-03-19 | Disposition: A | Discharge status: Home / Self Care | Payer: Medicare PPO | Attending: Internal Medicine | Admitting: Internal Medicine

## 2023-03-19 ENCOUNTER — Other Ambulatory Visit: Payer: Self-pay

## 2023-03-19 DIAGNOSIS — Z87891 Personal history of nicotine dependence: Secondary | ICD-10-CM | POA: Diagnosis not present

## 2023-03-19 DIAGNOSIS — D693 Immune thrombocytopenic purpura: Secondary | ICD-10-CM | POA: Diagnosis not present

## 2023-03-19 DIAGNOSIS — E1151 Type 2 diabetes mellitus with diabetic peripheral angiopathy without gangrene: Secondary | ICD-10-CM | POA: Insufficient documentation

## 2023-03-19 DIAGNOSIS — Z951 Presence of aortocoronary bypass graft: Secondary | ICD-10-CM | POA: Diagnosis not present

## 2023-03-19 DIAGNOSIS — Z09 Encounter for follow-up examination after completed treatment for conditions other than malignant neoplasm: Secondary | ICD-10-CM | POA: Diagnosis not present

## 2023-03-19 DIAGNOSIS — I509 Heart failure, unspecified: Secondary | ICD-10-CM

## 2023-03-19 DIAGNOSIS — R042 Hemoptysis: Secondary | ICD-10-CM | POA: Insufficient documentation

## 2023-03-19 DIAGNOSIS — I2510 Atherosclerotic heart disease of native coronary artery without angina pectoris: Secondary | ICD-10-CM | POA: Insufficient documentation

## 2023-03-19 DIAGNOSIS — Z7901 Long term (current) use of anticoagulants: Secondary | ICD-10-CM | POA: Insufficient documentation

## 2023-03-19 DIAGNOSIS — C3492 Malignant neoplasm of unspecified part of left bronchus or lung: Secondary | ICD-10-CM | POA: Diagnosis not present

## 2023-03-19 DIAGNOSIS — R918 Other nonspecific abnormal finding of lung field: Secondary | ICD-10-CM | POA: Diagnosis not present

## 2023-03-19 DIAGNOSIS — I110 Hypertensive heart disease with heart failure: Secondary | ICD-10-CM | POA: Diagnosis not present

## 2023-03-19 DIAGNOSIS — R911 Solitary pulmonary nodule: Secondary | ICD-10-CM | POA: Insufficient documentation

## 2023-03-19 DIAGNOSIS — Z8673 Personal history of transient ischemic attack (TIA), and cerebral infarction without residual deficits: Secondary | ICD-10-CM | POA: Insufficient documentation

## 2023-03-19 DIAGNOSIS — I5030 Unspecified diastolic (congestive) heart failure: Secondary | ICD-10-CM | POA: Insufficient documentation

## 2023-03-19 DIAGNOSIS — Z952 Presence of prosthetic heart valve: Secondary | ICD-10-CM | POA: Insufficient documentation

## 2023-03-19 DIAGNOSIS — C3410 Malignant neoplasm of upper lobe, unspecified bronchus or lung: Secondary | ICD-10-CM | POA: Insufficient documentation

## 2023-03-19 DIAGNOSIS — J432 Centrilobular emphysema: Secondary | ICD-10-CM | POA: Insufficient documentation

## 2023-03-19 DIAGNOSIS — I10 Essential (primary) hypertension: Secondary | ICD-10-CM | POA: Diagnosis not present

## 2023-03-19 HISTORY — PX: VIDEO BRONCHOSCOPY: SHX5072

## 2023-03-19 HISTORY — PX: BRONCHIAL WASHINGS: SHX5105

## 2023-03-19 LAB — ANAEROBIC CULTURE W GRAM STAIN

## 2023-03-19 LAB — BASIC METABOLIC PANEL
Anion gap: 9 (ref 5–15)
BUN: 36 mg/dL — ABNORMAL HIGH (ref 8–23)
CO2: 29 mmol/L (ref 22–32)
Calcium: 9.1 mg/dL (ref 8.9–10.3)
Chloride: 100 mmol/L (ref 98–111)
Creatinine, Ser: 1.73 mg/dL — ABNORMAL HIGH (ref 0.61–1.24)
GFR, Estimated: 40 mL/min — ABNORMAL LOW (ref >60)
Glucose, Bld: 191 mg/dL — ABNORMAL HIGH (ref 70–99)
Potassium: 4.6 mmol/L (ref 3.5–5.1)
Sodium: 138 mmol/L (ref 135–145)

## 2023-03-19 LAB — CBC
HCT: 51.2 % (ref 39.0–52.0)
Hemoglobin: 17.3 g/dL — ABNORMAL HIGH (ref 13.0–17.0)
MCH: 31 pg (ref 26.0–34.0)
MCHC: 33.8 g/dL (ref 30.0–36.0)
MCV: 91.8 fL (ref 80.0–100.0)
Platelets: 192 10*3/uL (ref 150–400)
RBC: 5.58 MIL/uL (ref 4.22–5.81)
RDW: 15.3 % (ref 11.5–15.5)
WBC: 8.2 10*3/uL (ref 4.0–10.5)
nRBC: 0 % (ref 0.0–0.2)

## 2023-03-19 LAB — BODY FLUID CELL COUNT WITH DIFFERENTIAL
Eos, Fluid: 1 %
Lymphs, Fluid: 46 %
Monocyte-Macrophage-Serous Fluid: 4 % — ABNORMAL LOW (ref 50–90)
Neutrophil Count, Fluid: 49 % — ABNORMAL HIGH (ref 0–25)
Total Nucleated Cell Count, Fluid: 37 cu mm (ref 0–1000)

## 2023-03-19 LAB — PROTIME-INR
INR: 3.1 — ABNORMAL HIGH (ref 0.8–1.2)
Prothrombin Time: 31.7 seconds — ABNORMAL HIGH (ref 11.4–15.2)

## 2023-03-19 LAB — GLUCOSE, CAPILLARY
Glucose-Capillary: 176 mg/dL — ABNORMAL HIGH (ref 70–99)
Glucose-Capillary: 172 mg/dL — ABNORMAL HIGH (ref 70–99)

## 2023-03-19 LAB — CULTURE, RESPIRATORY W GRAM STAIN: Gram Stain: NONE SEEN

## 2023-03-19 LAB — NOVEL CORONAVIRUS, NAA: SARS-CoV-2, NAA: NOT DETECTED

## 2023-03-19 SURGERY — VIDEO BRONCHOSCOPY WITHOUT FLUORO
Anesthesia: General | Laterality: Left

## 2023-03-19 MED ORDER — ONDANSETRON HCL 4 MG/2ML IJ SOLN
INTRAMUSCULAR | Status: DC | PRN
  Administered 2023-03-19: 4 mg via INTRAVENOUS

## 2023-03-19 MED ORDER — FENTANYL CITRATE (PF) 100 MCG/2ML IJ SOLN
INTRAMUSCULAR | Status: DC | PRN
  Administered 2023-03-19: 100 ug via INTRAVENOUS

## 2023-03-19 MED ORDER — FENTANYL CITRATE (PF) 100 MCG/2ML IJ SOLN: 25.0000 ug | INTRAMUSCULAR | Status: DC | PRN

## 2023-03-19 MED ORDER — ACETAMINOPHEN 10 MG/ML IV SOLN: 1000.0000 mg | Freq: Once | INTRAVENOUS | Status: DC | PRN

## 2023-03-19 MED ORDER — EPHEDRINE SULFATE-NACL 50-0.9 MG/10ML-% IV SOSY
PREFILLED_SYRINGE | INTRAVENOUS | Status: DC | PRN
  Administered 2023-03-19 (×2): 5 mg via INTRAVENOUS

## 2023-03-19 MED ORDER — PROPOFOL 10 MG/ML IV BOLUS
INTRAVENOUS | Status: DC | PRN
  Administered 2023-03-19: 120 mg via INTRAVENOUS
  Administered 2023-03-19: 30 mg via INTRAVENOUS

## 2023-03-19 MED ORDER — INSULIN ASPART 100 UNIT/ML IJ SOLN: 0.0000 [IU] | INTRAMUSCULAR | Status: DC | PRN

## 2023-03-19 MED ORDER — ROCURONIUM BROMIDE 10 MG/ML (PF) SYRINGE
PREFILLED_SYRINGE | INTRAVENOUS | Status: DC | PRN
  Administered 2023-03-19: 50 mg via INTRAVENOUS

## 2023-03-19 MED ORDER — CHLORHEXIDINE GLUCONATE 0.12 % MT SOLN
OROMUCOSAL | Status: AC
  Administered 2023-03-19: 15 mL
  Filled 2023-03-19: qty 15

## 2023-03-19 MED ORDER — ONDANSETRON HCL 4 MG/2ML IJ SOLN: 4.0000 mg | Freq: Once | INTRAMUSCULAR | Status: DC | PRN

## 2023-03-19 MED ORDER — LIDOCAINE 2% (20 MG/ML) 5 ML SYRINGE
INTRAMUSCULAR | Status: DC | PRN
  Administered 2023-03-19: 100 mg via INTRAVENOUS

## 2023-03-19 MED ORDER — LACTATED RINGERS IV SOLN: INTRAVENOUS | Status: DC

## 2023-03-19 MED ORDER — PHENYLEPHRINE 80 MCG/ML (10ML) SYRINGE FOR IV PUSH (FOR BLOOD PRESSURE SUPPORT)
PREFILLED_SYRINGE | INTRAVENOUS | Status: DC | PRN
  Administered 2023-03-19: 80 ug via INTRAVENOUS
  Administered 2023-03-19 (×2): 160 ug via INTRAVENOUS

## 2023-03-19 MED ORDER — DEXAMETHASONE SODIUM PHOSPHATE 10 MG/ML IJ SOLN
INTRAMUSCULAR | Status: DC | PRN
  Administered 2023-03-19: 10 mg via INTRAVENOUS

## 2023-03-19 MED ORDER — SUGAMMADEX SODIUM 200 MG/2ML IV SOLN
INTRAVENOUS | Status: DC | PRN
  Administered 2023-03-19 (×2): 200 mg via INTRAVENOUS

## 2023-03-19 NOTE — Op Note (Signed)
Bronchoscopy Procedure Note  [name] [name]  [id number]  10-11-1946  Date:03/19/23  Time:9:01 AM   Provider Performing:Ahnesty Finfrock C Katrinka Blazing   Procedure(s):  Flexible bronchoscopy with bronchial alveolar lavage [phone])  Indication(s) Hemoptysis  Consent Risks of the procedure as well as the alternatives and risks of each were explained to the patient and/or caregiver.  Consent for the procedure was obtained and is signed in the bedside chart  Anesthesia General   Time Out Verified patient identification, verified procedure, site/side was marked, verified correct patient position, special equipment/implants available, medications/allergies/relevant history reviewed, required imaging and test results available.   Sterile Technique Usual hand hygiene, masks, gowns, and gloves were used   Procedure Description Bronchoscope advanced through endotracheal tube and into airway.  Airways were examined down to subsegmental level with findings noted below.   Following diagnostic evaluation, BAL(s) performed in LLL with normal saline and return of clear fluid with occasional blood clots fluid  Findings:  No blood seen during intubation but immediately seen on entering trachea  R lung completely clear; only notable finding is RUL bronchial pitting  LLL oozing noted   Serial BAL did not darken, seems more c/w a small airway bleed rather than alveolitis.  Question injury related to nearby SBRT?  Complications/Tolerance None; patient tolerated the procedure well. Chest X-ray is not needed post procedure.   EBL Minimal   Specimen(s) LLL BAL

## 2023-03-19 NOTE — Interval H&P Note (Signed)
Seen for hemoptysis. Discussed bronch risks/benefits, agreeable. No changes to hx or physical.  Myrla Halsted MD

## 2023-03-19 NOTE — Transfer of Care (Signed)
Immediate Anesthesia Transfer of Care Note  Patient: [name] [name]  Procedure(s) Performed: VIDEO BRONCHOSCOPY WITHOUT FLUORO (Left) BRONCHIAL WASHINGS  Patient Location: PACU  Anesthesia Type:General  Level of Consciousness: drowsy and patient cooperative  Airway & Oxygen Therapy: Patient Spontanous Breathing and Patient connected to face mask oxygen  Post-op Assessment: Report given to RN and Post -op Vital signs reviewed and stable  Post vital signs: Reviewed and stable  Last Vitals:  Vitals Value Taken Time  BP 199/68 03/19/23 0900  Temp    Pulse 71 03/19/23 0900  Resp 17 03/19/23 0900  SpO2 98 % 03/19/23 0900    Last Pain:  Vitals:   03/19/23 0654  TempSrc:   PainSc: 0-No pain         Complications: No notable events documented.

## 2023-03-19 NOTE — Anesthesia Procedure Notes (Signed)
Procedure Name: Intubation Date/Time: 03/19/2023 8:26 AM  Performed by: Waynard Edwards, CRNAPre-anesthesia Checklist: Patient identified, Emergency Drugs available, Suction available and Patient being monitored Patient Re-evaluated:Patient Re-evaluated prior to induction Oxygen Delivery Method: Circle system utilized Preoxygenation: Pre-oxygenation with 100% oxygen Induction Type: IV induction Ventilation: Mask ventilation without difficulty and Oral airway inserted - appropriate to patient size Laryngoscope Size: Mac and 4 Grade View: Grade II Tube type: Oral Tube size: 8.5 mm Number of attempts: 1 Airway Equipment and Method: Stylet and Oral airway Placement Confirmation: ETT inserted through vocal cords under direct vision, positive ETCO2 and breath sounds checked- equal and bilateral Secured at: 24 cm Tube secured with: Tape Dental Injury: Teeth and Oropharynx as per pre-operative assessment

## 2023-03-19 NOTE — Anesthesia Postprocedure Evaluation (Signed)
Anesthesia Post Note  Patient: [name] [name]  Procedure(s) Performed: VIDEO BRONCHOSCOPY WITHOUT FLUORO (Left) BRONCHIAL WASHINGS     Patient location during evaluation: PACU Anesthesia Type: General Level of consciousness: awake and alert Pain management: pain level controlled Vital Signs Assessment: post-procedure vital signs reviewed and stable Respiratory status: spontaneous breathing, nonlabored ventilation, respiratory function stable and patient connected to nasal cannula oxygen Cardiovascular status: blood pressure returned to baseline and stable Postop Assessment: no apparent nausea or vomiting Anesthetic complications: no  No notable events documented.  Last Vitals:  Vitals:   03/19/23 0915 03/19/23 0930  BP: (!) 155/58 (!) 153/41  Pulse: 69 68  Resp: 17 15  Temp:  (!) 36.4 C  SpO2: 91% 92%    Last Pain:  Vitals:   03/19/23 0930  TempSrc:   PainSc: 0-No pain                 Trevor Iha

## 2023-03-21 LAB — CULTURE, RESPIRATORY W GRAM STAIN

## 2023-03-21 LAB — ANAEROBIC CULTURE W GRAM STAIN

## 2023-03-22 ENCOUNTER — Encounter (HOSPITAL_COMMUNITY): Payer: Self-pay | Admitting: Internal Medicine

## 2023-03-22 LAB — CYTOLOGY - NON PAP

## 2023-03-23 LAB — ACID FAST SMEAR (AFB, MYCOBACTERIA): Acid Fast Smear: NEGATIVE

## 2023-03-24 ENCOUNTER — Telehealth: Payer: Self-pay | Admitting: Acute Care

## 2023-03-24 ENCOUNTER — Ambulatory Visit (INDEPENDENT_AMBULATORY_CARE_PROVIDER_SITE_OTHER): Payer: Medicare PPO | Admitting: Acute Care

## 2023-03-24 ENCOUNTER — Encounter: Payer: Self-pay | Admitting: Acute Care

## 2023-03-24 VITALS — BP 160/64 | HR 88 | Ht 68.0 in | Wt 168.2 lb

## 2023-03-24 DIAGNOSIS — Z87891 Personal history of nicotine dependence: Secondary | ICD-10-CM

## 2023-03-24 DIAGNOSIS — R042 Hemoptysis: Secondary | ICD-10-CM

## 2023-03-24 DIAGNOSIS — Z85118 Personal history of other malignant neoplasm of bronchus and lung: Secondary | ICD-10-CM

## 2023-03-24 DIAGNOSIS — C3410 Malignant neoplasm of upper lobe, unspecified bronchus or lung: Secondary | ICD-10-CM

## 2023-03-24 LAB — CBC WITH DIFFERENTIAL/PLATELET
Basophils Absolute: 0.1 10*3/uL (ref 0.0–0.1)
Basophils Relative: 0.8 % (ref 0.0–3.0)
Eosinophils Absolute: 0.1 10*3/uL (ref 0.0–0.7)
Eosinophils Relative: 1.6 % (ref 0.0–5.0)
HCT: 53.6 % — ABNORMAL HIGH (ref 39.0–52.0)
Hemoglobin: 17.7 g/dL — ABNORMAL HIGH (ref 13.0–17.0)
Lymphocytes Relative: 25.1 % (ref 12.0–46.0)
Lymphs Abs: 2.3 10*3/uL (ref 0.7–4.0)
MCHC: 33.1 g/dL (ref 30.0–36.0)
MCV: 93.3 fl (ref 78.0–100.0)
Monocytes Absolute: 1.2 10*3/uL — ABNORMAL HIGH (ref 0.1–1.0)
Monocytes Relative: 12.5 % — ABNORMAL HIGH (ref 3.0–12.0)
Neutro Abs: 5.6 10*3/uL (ref 1.4–7.7)
Neutrophils Relative %: 60 % (ref 43.0–77.0)
Platelets: 188 10*3/uL (ref 150.0–400.0)
RBC: 5.75 Mil/uL (ref 4.22–5.81)
RDW: 14.4 % (ref 11.5–15.5)
WBC: 9.3 10*3/uL (ref 4.0–10.5)

## 2023-03-24 LAB — ANAEROBIC CULTURE W GRAM STAIN

## 2023-03-24 MED ORDER — HYDROCODONE BIT-HOMATROP MBR 5-1.5 MG/5ML PO SOLN
5.0000 mL | Freq: Four times a day (QID) | ORAL | 0 refills | Status: DC | PRN
Start: 2023-03-24 — End: 2023-05-14

## 2023-03-24 MED ORDER — HYDROCODONE BIT-HOMATROP MBR 5-1.5 MG/5ML PO SOLN
5.0000 mL | Freq: Every evening | ORAL | 0 refills | Status: DC | PRN
Start: 2023-03-24 — End: 2023-03-24

## 2023-03-24 NOTE — Telephone Encounter (Signed)
Rx sent to pharmacy on file for hemoptysis. PDMP reviewed.

## 2023-03-24 NOTE — Progress Notes (Signed)
History of Present Illness [name] [name] is a 77 y.o. male former smoker ( Quit with 45 pack year smoking  history) with past medical history of aortic stenosis, mechanical Saint Jude AVR on Coumadin, heart failure, COPD, history of coronary artery disease status post CABG, ITP, peripheral arterial disease, TIA, type 2 diabetes. He is followed by Dr. Tonia Brooms for lung nodules and hemoptysis  Synopsis  [name], with severe coronary disease, history of AVR on Coumadin, heart failure on Entresto.  Underwent previous navigational bronchoscopy for a stage I primary bronchogenic carcinoma.  Had SBRT treatments 02/18/2021 through 02/25/2021 and has done well since then.  He has an area of scarring in his lingula that has remained persistent since radiation treatments but now has a progressive infiltrate in the left lower lobe with intermittent bouts of small hemoptysis.  Since he has had a nonresolving infiltrate in the left lower lobe with associated hemoptysis the concern is underlying DAH. It is possible that this is related to his ongoing anticoagulation with Coumadin.  This makes it very difficult situation as he needs to be anticoagulated due to his AVR.  Dr. Tonia Brooms discussed case with radiation oncology as well as Dr. Myrla Halsted   Dr. Katrinka Blazing was  willing to do bronchoscopy with BAL serial BAL's to rule out DAH. We will need to send specimens for culture, respiratory culture, fungus and AFB.  We also need to send for cell count analysis with differential.    Dr. Katrinka Blazing did a bronchoscopy with BAL on 03/19/2023 to evaluate source of hemoptysis and to rule out DAH. Per the OP note Right lung was clear, only notable finding was RUL bronchial pitting. The LLL oozing was noted. Serial BAL did not darken . Dr. Katrinka Blazing felt this was more consistent with a small airway bleed rather than alveolitis. He Questioned injury related to nearby SBRT.      03/24/2023 Pt. Presents for follow  up after  Flexible bronchoscopy with bronchial alveolar lavage on 03/19/2023. He states he has done well since the procedure. No fever post bronch No change in color of secretions . No sore throat. He does have baseline secretions that are clear with his ongoing hemoptysis.He does have a cough.  He states he still has blood in his secretions daily. The blood is dark, not bright red. He knows the difference, and knows to call or seek emergency care for any change to bright red blood. He is currently not taking anything for cough.  We discussed the results of his bronch. Cultures are negative to date.  Plan will be for cough suppression and follow up in 2 weeks to see if hemoptysis is improving. I am encouraged that the blood is dark, and no longer bright red. CBC today in the office shows stable HGB and Platelets.  Chest Imaging: 12/08/2020 CT chest: 11 x 16 mm lingular lung nodule concerning for primary bronchogenic carcinoma with lobulated edges.   12/13/2020 nuclear medicine PET/CT: A 15 mm lingular nodule SUV max 2.3 concerning for primary bronchogenic carcinoma.   March  2024 CT chest: Progressive infiltrate within the left lower lobe seen in serial imaging over the past 8 months.  He has already been treated with antibiotics for pneumonia.     Test Results: Cytology  03/19/2023 Specimen Submitted:  A. LUNG, LLL, LAVAGE:   FINAL MICROSCOPIC DIAGNOSIS:  - No malignant cells identified   BAL No growth  AFB #1 Negative  AFB #2 Pending BAL  anaerobes Negative   Fungal Culture pending.         Latest Ref Rng & Units 03/24/2023    2:21 PM 03/19/2023    6:49 AM 02/25/2023   12:04 PM  CBC  WBC 4.0 - 10.5 K/uL 9.3  8.2  8.1   Hemoglobin 13.0 - 17.0 g/dL 16.1  09.6  04.5   Hematocrit 39.0 - 52.0 % 53.6  51.2  49.2   Platelets 150.0 - 400.0 K/uL 188.0  192  215        Latest Ref Rng & Units 03/19/2023    6:49 AM 03/09/2023    1:37 PM 02/25/2023   12:04 PM  BMP  Glucose 70 - 99 mg/dL  409  811  914   BUN 8 - 23 mg/dL 36  27  25   Creatinine 0.61 - 1.24 mg/dL 7.82  9.56  2.13   Sodium 135 - 145 mmol/L 138  138  135   Potassium 3.5 - 5.1 mmol/L 4.6  4.4  4.9   Chloride 98 - 111 mmol/L 100  99  98   CO2 22 - 32 mmol/L 29  29  25    Calcium 8.9 - 10.3 mg/dL 9.1  8.9  8.9     BNP    Component Value Date/Time   BNP 376.3 (H) 02/25/2023 1204    ProBNP    Component Value Date/Time   PROBNP 3,116 (H) 10/02/2019 1131    PFT No results found for: "FEV1PRE", "FEV1POST", "FVCPRE", "FVCPOST", "TLC", "DLCOUNC", "PREFEV1FVCRT", "PSTFEV1FVCRT"  CT CHEST WO CONTRAST  Result Date: 03/03/2023 CLINICAL DATA:  Non-small cell lung cancer, assess treatment response * Tracking Code: BO * EXAM: CT CHEST WITHOUT CONTRAST TECHNIQUE: Multidetector CT imaging of the chest was performed following the standard protocol without IV contrast. RADIATION DOSE REDUCTION: This exam was performed according to the departmental dose-optimization program which includes automated exposure control, adjustment of the mA and/or kV according to patient size and/or use of iterative reconstruction technique. COMPARISON:  01/13/2023 FINDINGS: Cardiovascular: Pipe like aortic atherosclerosis. Aortic valve prosthesis. Cardiomegaly. Three-vessel coronary artery calcifications. No pericardial effusion. Mediastinum/Nodes: Unchanged prominent mediastinal lymph nodes. No overtly enlarged mediastinal, hilar, or axillary lymph nodes. Thyroid gland, trachea, and esophagus demonstrate no significant findings. Lungs/Pleura: Moderate centrilobular emphysema. Diffuse bilateral bronchial wall thickening. Unchanged treated mass with adjacent fiducial markers in the lingula, measuring 2.9 x 1.3 cm (series 4, image 88). Extensive interlobular septal thickening and ground-glass airspace opacity throughout the left lung base, similar to prior examination. Additional scattered ground-glass and septal thickening in the right lung base.  Unchanged subpleural nodule of the anterior right middle lobe measuring 0.5 cm (series 4, image 108). No pleural effusion or pneumothorax. Upper Abdomen: No acute abnormality. Coarse, nodular cirrhotic morphology of the liver. Musculoskeletal: No chest wall abnormality. No acute osseous findings. IMPRESSION: 1. Unchanged treated mass with adjacent fiducial markers in the lingula. 2. Extensive interlobular septal thickening and ground-glass airspace opacity throughout the left lung base, similar to prior examination. Additional scattered ground-glass and septal thickening in the right lung base. This is of uncertain significance, possibly infectious or inflammatory, unusually prominent radiation pneumonitis generally considered less likely but a differential consideration. 3. Unchanged subpleural nodule of the anterior right middle lobe measuring 0.5 cm. Continued attention on follow-up. 4. Emphysema and diffuse bilateral bronchial wall thickening. 5. Cardiomegaly and coronary artery disease. 6. Coarse, nodular cirrhotic morphology of the liver. Correlate with biochemical findings. Aortic Atherosclerosis (ICD10-I70.0) and Emphysema (ICD10-J43.9). Electronically Signed  By: Jearld Lesch M.D.   On: 03/03/2023 09:39     Past medical hx Past Medical History:  Diagnosis Date   Adenomatous colon polyp 09/1997   Anemia    Aortic stenosis    s/p st. jude mechanical AVR - Chronic Coumadin   Blood transfusion    "related to ITP"   CHF (congestive heart failure)    COPD (chronic obstructive pulmonary disease)    patient denies this dx on 01/27/21   Coronary artery disease    s/p cabg x 3 11/2003: lima-lad, seq vg to rpda and rpl   Diverticulitis of colon    Dysrhythmia    a-fib   GERD (gastroesophageal reflux disease)    patient states he has never had reflux   Heart murmur    History of radiation therapy 02/18/2021, 02/20/2021, 02/25/2021   SBRT to left lung     Dr Antony Blackbird   Hyperlipidemia     Hypertension    ITP (idiopathic thrombocytopenic purpura)    Lung cancer    last week   Peripheral arterial disease    a. history of aortobifemoral bypass grafting by Dr. Tawanna Cooler early b. LE angiography 04/22/2015 patent aortobifem graft, DES to R SFA   Peripheral vascular disease    s/p Left external Iliac Artery stenting and subsequent left femoral endarterectomy 02/2011- post op course complicated by wound infxn req I&D 03/2011   Renal artery stenosis, native, bilateral    a. bilateral renal artery stenosis by recent duplex ultrasound b. L renal artery stent 02/2015, R renal artery patent on angiogram   Stroke 2015   TIA (transient ischemic attack) ~ 2013   Type II diabetes mellitus      Social History   Tobacco Use   Smoking status: Former    Packs/day: 1.50    Years: 30.00    Additional pack years: 0.00    Total pack years: 45.00    Types: Cigarettes, Cigars    Quit date: 12/15/1993    Years since quitting: 29.2   Smokeless tobacco: Never   Tobacco comments:    occasional cigar  Vaping Use   Vaping Use: Never used  Substance Use Topics   Alcohol use: Yes    Alcohol/week: 15.0 standard drinks of alcohol    Types: 1 Cans of beer, 7 Shots of liquor, 7 Standard drinks or equivalent per week    Comment: drinks 1 martini's a night (2 shots)   Drug use: No    Mr.Urquilla reports that he quit smoking about 29 years ago. His smoking use included cigarettes and cigars. He has a 45.00 pack-year smoking history. He has never used smokeless tobacco. He reports current alcohol use of about 15.0 standard drinks of alcohol per week. He reports that he does not use drugs.  Tobacco Cessation: Quit in 1995 with a 45 pack year smoking history  Past surgical hx, Family hx, Social hx all reviewed.  Current Outpatient Medications on File Prior to Visit  Medication Sig   atorvastatin (LIPITOR) 80 MG tablet Take 80 mg by mouth daily.   Blood Glucose Monitoring Suppl (ONETOUCH VERIO IQ SYSTEM)  w/Device KIT    carvedilol (COREG) 3.125 MG tablet Take 1 tablet (3.125 mg total) by mouth 2 (two) times daily.   enoxaparin (LOVENOX) 80 MG/0.8ML injection Inject 0.8 mLs (80 mg total) into the skin every 12 (twelve) hours. As instructed by Coumadin Clinic   ENTRESTO 24-26 MG TAKE 1 TABLET BY MOUTH TWICE A DAY  Evolocumab 140 MG/ML SOAJ Inject 140 mg into the skin every 14 (fourteen) days.   furosemide (LASIX) 20 MG tablet 60 mg daily alternating with 40 mg daily. (Patient taking differently: Take 40-60 mg by mouth See admin instructions. 60 mg daily alternating with 40 mg daily.)   glucose blood (ONETOUCH VERIO) test strip    glyBURIDE (DIABETA) 2.5 MG tablet Take 5 mg by mouth 2 (two) times daily with a meal.   INVOKANA 100 MG TABS tablet Take 100 mg by mouth daily.   nitroGLYCERIN (NITROSTAT) 0.4 MG SL tablet Place 1 tablet (0.4 mg total) under the tongue every 5 (five) minutes x 3 doses as needed for chest pain.   OneTouch Delica Lancets 33G MISC    spironolactone (ALDACTONE) 25 MG tablet TAKE 1 TABLET BY MOUTH EVERY DAY   warfarin (COUMADIN) 5 MG tablet TAKE 1 TO 1 AND 1/2 TABLETS BY MOUTH DAILY AS PRESCRIBED BY THE COUMADIN CLINIC. (Patient taking differently: Take 5-7.5 mg by mouth See admin instructions. Take 10 mg on Tuesday and Saturday, all the days take 7.5 mg  as prescribed by the coumadin clinic.)   No current facility-administered medications on file prior to visit.     No Known Allergies  Review Of Systems:  Constitutional:   No  weight loss, night sweats,  Fevers, chills, fatigue, or  lassitude.  HEENT:   No headaches,  Difficulty swallowing,  Tooth/dental problems, or  Sore throat,                No sneezing, itching, ear ache, nasal congestion, post nasal drip,   CV:  No chest pain,  Orthopnea, PND, swelling in lower extremities, anasarca, dizziness, palpitations, syncope.   GI  No heartburn, indigestion, abdominal pain, nausea, vomiting, diarrhea, change in bowel  habits, loss of appetite, bloody stools.   Resp: No shortness of breath with exertion or at rest.  No excess mucus, + productive cough,  No non-productive cough,  No coughing up of blood.  No change in color of mucus.  No wheezing.  No chest wall deformity, + hemoptysis dark red blood  Skin: no rash or lesions.  GU: no dysuria, change in color of urine, no urgency or frequency.  No flank pain, no hematuria   MS:  No joint pain or swelling.  No decreased range of motion.  No back pain.  Psych:  No change in mood or affect. No depression or anxiety.  No memory loss.   Vital Signs BP (!) 160/64 (BP Location: Left Arm, Patient Position: Sitting, Cuff Size: Small)   Pulse 88   Ht  (1.727 m)   Wt 168 lb 3.2 oz (76.3 kg)   SpO2 91%   BMI 25.57 kg/m    Physical Exam:  General- No distress,  A&O x 3, pleasant  ENT: No sinus tenderness, TM clear, pale nasal mucosa, no oral exudate,no post nasal drip, no LAN Cardiac: S1, S2, regular rate and rhythm, no murmur Chest: No wheeze/ rales/ dullness; no accessory muscle use, no nasal flaring, no sternal retractions Abd.: Soft Non-tender, ND, BS +, Body mass index is 25.57 kg/m.  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 It is reassuring that the blood you are coughing up is dark vs bright red. Please take either Delsym cough medication , or Robitussin during the day for cough suppression. I will send in Hycodan for  bedtime . Take 5 cc's as needed for cough. This will make you sleepy. Do not drive if sleepy. As long as blood is old and dark red, and does not increase in amount we will have you follow up in 2-3 weeks with Dr. Tonia Brooms , Delton Coombes, or Meier  to see if this is improving, and to follow pending micro. If volume of hemoptysis increases in amount or turns bright red, call to be seen or seek emergency care.  Call if you need to be seen sooner   I spent 33 minutes dedicated to the care of this patient on the date of this encounter to include pre-visit review of records, face-to-face time with the patient discussing conditions above, post visit ordering of testing, clinical documentation with the electronic health record, making appropriate referrals as documented, and communicating necessary information to the patient's healthcare team.   Bevelyn Ngo, NP 03/24/2023  4:46 PM

## 2023-03-24 NOTE — Patient Instructions (Addendum)
It is good to see you today.  Your BAL does not show malignant cells or infection. It is reassuring that the blood you are coughing up is dark vs bright red. Please take either Delsym cough medication , or Robitussin during the day for cough suppression. I will send in Hycodan for bedtime . Take 5 cc's as needed for cough. This will make you sleepy. Do not drive if sleepy. As long as blood is old and dark red, and does not increase in amount we will have you follow up in 2-3 weeks with Dr. Tonia Brooms or Delton Coombes, Meier  to see if this is improving. If volume increases in amount or turns bright red, call to be seen or seek emergency care.  Call if you need to be seen sooner

## 2023-03-24 NOTE — Telephone Encounter (Signed)
Called pt left detailed message about CBC results as per SG, NP and asked pt to give [place] a call if he has questions. Nothing further needed at this time.

## 2023-03-24 NOTE — Telephone Encounter (Signed)
Please call patient and let him know his CBC shows his HGB and platelets are stable. This is good news.  Have him follow up in 2 weeks with Maralyn Sago NP as we discussed today in the office , and seek emergency care if the bleeding becomes bright red, or increases in volume. Cough syrup as we discussed today in the office. Thanks so much

## 2023-03-24 NOTE — Telephone Encounter (Signed)
As my Impreva will not work today, can you send in hycodan 5 mg po Q HS prn cough for hemoptysis. 120 cc's , no refills. Thanks so much

## 2023-03-26 LAB — FUNGUS CULTURE WITH STAIN

## 2023-03-26 LAB — FUNGUS CULTURE RESULT

## 2023-03-30 ENCOUNTER — Ambulatory Visit: Payer: Medicare PPO | Admitting: Primary Care

## 2023-04-21 ENCOUNTER — Encounter: Payer: Self-pay | Admitting: Emergency Medicine

## 2023-04-21 ENCOUNTER — Ambulatory Visit: Payer: Medicare PPO | Attending: Cardiovascular Disease | Admitting: *Deleted

## 2023-04-21 ENCOUNTER — Ambulatory Visit (INDEPENDENT_AMBULATORY_CARE_PROVIDER_SITE_OTHER): Payer: Medicare PPO | Admitting: Emergency Medicine

## 2023-04-21 VITALS — BP 128/74 | HR 65 | Temp 97.7°F | Ht 68.0 in | Wt 170.8 lb

## 2023-04-21 DIAGNOSIS — I359 Nonrheumatic aortic valve disorder, unspecified: Secondary | ICD-10-CM

## 2023-04-21 DIAGNOSIS — R918 Other nonspecific abnormal finding of lung field: Secondary | ICD-10-CM

## 2023-04-21 DIAGNOSIS — G459 Transient cerebral ischemic attack, unspecified: Secondary | ICD-10-CM | POA: Diagnosis not present

## 2023-04-21 DIAGNOSIS — Z5181 Encounter for therapeutic drug level monitoring: Secondary | ICD-10-CM

## 2023-04-21 LAB — POCT INR: INR: 2.6 (ref 2.0–3.0)

## 2023-04-21 NOTE — Patient Instructions (Signed)
You can continue to use your Hycodan for cough suppression if needed We will follow your culture information from your bronchoscopy 03/19/2023 until complete Follow Dr. Tonia Brooms or with Saralyn Pilar in about 3 weeks to review the culture information, decide next steps in evaluation. Call us immediately if you have an increase in bleeding or any concerning respiratory symptoms so we can see you sooner.

## 2023-04-21 NOTE — Patient Instructions (Signed)
Description   Continue taking  warfarin 1.5 tablets daily EXCEPT 2 tablets on Saturdays. Recheck INR 6 weeks. Stay consistent with greens. Coumadin Clinic for any changes in medications or upcoming procedures. [phone]      

## 2023-04-21 NOTE — Progress Notes (Signed)
Subjective:    Patient ID: [name] [name], male    DOB: 1946/05/03, 77 y.o.   MRN: [id number]  HPI 77 year old former smoker (45 pack years) on Coumadin for [facility] Jude AVR.  History of CAD/CABG, COPD, ITP, diabetes.  Diagnosed with stage I non-small cell lung cancer in the lingula 02/2021 and underwent SBRT.  Subsequent course complicated by persistent left lower lobe infiltrate and some associated hemoptysis.  Some question of possible underlying DAH prompting repeat bronchoscopy 03/19/2023.  No clear evidence to support DAH, question local injury post SBRT.  He was seen 03/24/2023, CBC stable, given Hycodan for cough suppression.  Today he reports that he did the cough suppression, had some decrease in the amount of mucous and bleed. He is still seeing some dark blood - saw it yesterday, first time in about a week. Saw around a teaspoon.   CT chest 03/02/2023 reviewed by me, shows unchanged treated lingular mass with adjacent fiducial markers, extensive intralobular septal thickening and groundglass airspace opacity in the left lung base, unchanged from prior.  New groundglass and septal thickening right lung base unclear significance.  AFB 03/19/2023 smear negative, culture pending.  Fungal culture negative   Review of Systems As per HPI  Past Medical History:  Diagnosis Date   Adenomatous colon polyp 09/1997   Anemia    Aortic stenosis    s/p [facility] mechanical AVR - Chronic Coumadin   Blood transfusion    "related to ITP"   CHF (congestive heart failure) (HCC)    COPD (chronic obstructive pulmonary disease) (HCC)    patient denies this dx on 01/27/21   Coronary artery disease    s/p cabg x 3 11/2003: lima-lad, seq vg to rpda and rpl   Diverticulitis of colon    Dysrhythmia    a-fib   GERD (gastroesophageal reflux disease)    patient states he has never had reflux   Heart murmur    History of radiation therapy 02/18/2021, 02/20/2021, 02/25/2021   SBRT to left lung     Dr Antony Blackbird   Hyperlipidemia    Hypertension    ITP (idiopathic thrombocytopenic purpura)    Lung cancer (HCC)    last week   Peripheral arterial disease (HCC)    a. history of aortobifemoral bypass grafting by Dr. Tawanna Cooler early b. LE angiography 04/22/2015 patent aortobifem graft, DES to R SFA   Peripheral vascular disease (HCC)    s/p Left external Iliac Artery stenting and subsequent left femoral endarterectomy 02/2011- post op course complicated by wound infxn req I&D 03/2011   Renal artery stenosis, native, bilateral (HCC)    a. bilateral renal artery stenosis by recent duplex ultrasound b. L renal artery stent 02/2015, R renal artery patent on angiogram   Stroke (HCC) 2015   TIA (transient ischemic attack) ~ 2013   Type II diabetes mellitus (HCC)      Family History  Problem Relation Age of Onset   Coronary artery disease Mother        bypass surgery - deceased   Heart disease Father        murmur, valve replacement - deceased   Breast cancer Sister    Diabetes Other        grandmother   Diabetes Paternal Grandmother    Diabetes Paternal Aunt    Colon cancer Neg Hx    Colon polyps Neg Hx    Esophageal cancer Neg Hx    Rectal cancer Neg Hx  Stomach cancer Neg Hx      Social History   Socioeconomic History   Marital status: Divorced    Spouse name: Not on file   Number of children: 3   Years of education: Not on file   Highest education level: Not on file  Occupational History   Occupation: Retired  Tobacco Use   Smoking status: Former    Packs/day: 1.50    Years: 30.00    Additional pack years: 0.00    Total pack years: 45.00    Types: Cigarettes, Cigars    Quit date: 12/15/1993    Years since quitting: 29.3   Smokeless tobacco: Never   Tobacco comments:    occasional cigar  Vaping Use   Vaping Use: Never used  Substance and Sexual Activity   Alcohol use: Yes    Alcohol/week: 15.0 standard drinks of alcohol    Types: 1 Cans of beer, 7 Shots of liquor, 7 Standard  drinks or equivalent per week    Comment: drinks 1 martini's a night (2 shots)   Drug use: No   Sexual activity: Yes  Other Topics Concern   Not on file  Social History Narrative   Tries to remain active.  Frequent golfer but claudication limits this.   1 child has passed   Social Determinants of Corporate investment banker Strain: Not on file  Food Insecurity: Not on file  Transportation Needs: Not on file  Physical Activity: Not on file  Stress: Not on file  Social Connections: Not on file  Intimate Partner Violence: Not on file     No Known Allergies   Outpatient Medications Prior to Visit  Medication Sig Dispense Refill   atorvastatin (LIPITOR) 80 MG tablet Take 80 mg by mouth daily.     Blood Glucose Monitoring Suppl (ONETOUCH VERIO IQ SYSTEM) w/Device KIT      carvedilol (COREG) 3.125 MG tablet Take 1 tablet (3.125 mg total) by mouth 2 (two) times daily. 180 tablet 3   enoxaparin (LOVENOX) 80 MG/0.8ML injection Inject 0.8 mLs (80 mg total) into the skin every 12 (twelve) hours. As instructed by Coumadin Clinic 8 mL 0   ENTRESTO 24-26 MG TAKE 1 TABLET BY MOUTH TWICE A DAY 60 tablet 6   Evolocumab 140 MG/ML SOAJ Inject 140 mg into the skin every 14 (fourteen) days.     furosemide (LASIX) 20 MG tablet 60 mg daily alternating with 40 mg daily. (Patient taking differently: Take 40-60 mg by mouth See admin instructions. 60 mg daily alternating with 40 mg daily.) 180 tablet 3   glucose blood (ONETOUCH VERIO) test strip      glyBURIDE (DIABETA) 2.5 MG tablet Take 5 mg by mouth 2 (two) times daily with a meal.     HYDROcodone bit-homatropine (HYCODAN) 5-1.5 MG/5ML syrup Take 5 mLs by mouth every 6 (six) hours as needed for cough. 120 mL 0   JARDIANCE 25 MG TABS tablet Take 25 mg by mouth daily.     nitroGLYCERIN (NITROSTAT) 0.4 MG SL tablet Place 1 tablet (0.4 mg total) under the tongue every 5 (five) minutes x 3 doses as needed for chest pain. 25 tablet 12   OneTouch Delica Lancets  33G MISC      spironolactone (ALDACTONE) 25 MG tablet TAKE 1 TABLET BY MOUTH EVERY DAY 90 tablet 3   warfarin (COUMADIN) 5 MG tablet TAKE 1 TO 1 AND 1/2 TABLETS BY MOUTH DAILY AS PRESCRIBED BY THE COUMADIN CLINIC. (Patient taking differently:  Take 5-7.5 mg by mouth See admin instructions. Take 10 mg on Tuesday and Saturday, all the days take 7.5 mg  as prescribed by the coumadin clinic.) 120 tablet 1   INVOKANA 100 MG TABS tablet Take 100 mg by mouth daily. (Patient not taking: Reported on 04/21/2023)     No facility-administered medications prior to visit.         Objective:   Physical Exam  Vitals:   04/21/23 0836  BP: 128/74  Pulse: 65  Temp: 97.7 F (36.5 C)  TempSrc: Oral  SpO2: 99%  Weight: 170 lb 12.8 oz (77.5 kg)  Height: 5\' 8"  (1.727 m)    Gen: Pleasant, well-nourished, in no distress,  normal affect  ENT: No lesions,  mouth clear,  oropharynx clear, no postnasal drip  Neck: No JVD, no stridor  Lungs: No use of accessory muscles, no crackles or wheezing on normal respiration, no wheeze on forced expiration  Cardiovascular: RRR, heart sounds normal, no murmur or gallops, no peripheral edema  Musculoskeletal: No deformities, no cyanosis or clubbing  Neuro: alert, awake, non focal  Skin: Warm, no lesions or rash    Assessment & Plan:  Pulmonary infiltrate Interesting case of left lower lobe diffuse infiltrate with associated hemoptysis (on Coumadin) in a patient who underwent lingular SBRT.  Most recent bronchoscopy was not definitive for evidence for DAH, AFB and fungal cultures are still pending from that procedure.  His cough burden, mucus and hemoptysis have all decreased since he was last seen here with cough suppression with Hycodan.  Would support waiting until his fungal and AFB cultures are final, then follow-up to determine next steps.  Could consider empiric steroids if we think this is an inflammatory pneumonitis.  Alternatively could repeat a  bronchoscopy and obtain left lower lobe transbronchial biopsies to ensure no evidence of lipidic malignancy (unlikely).  He will follow-up in several weeks.  The culture data should be finalized by that time.  He knows to call sooner if he has any increase in bleeding.   Levy Pupa, MD, PhD 04/21/2023, 11:58 AM [facility] [phone] or if no answer before 7:00PM call [phone] For any issues after 7:00PM please call eLink [phone]

## 2023-04-21 NOTE — Assessment & Plan Note (Signed)
Interesting case of left lower lobe diffuse infiltrate with associated hemoptysis (on Coumadin) in a patient who underwent lingular SBRT.  Most recent bronchoscopy was not definitive for evidence for DAH, AFB and fungal cultures are still pending from that procedure.  His cough burden, mucus and hemoptysis have all decreased since he was last seen here with cough suppression with Hycodan.  Would support waiting until his fungal and AFB cultures are final, then follow-up to determine next steps.  Could consider empiric steroids if we think this is an inflammatory pneumonitis.  Alternatively could repeat a bronchoscopy and obtain left lower lobe transbronchial biopsies to ensure no evidence of lipidic malignancy (unlikely).  He will follow-up in several weeks.  The culture data should be finalized by that time.  He knows to call sooner if he has any increase in bleeding.

## 2023-04-22 LAB — FUNGUS CULTURE WITH STAIN

## 2023-04-22 LAB — FUNGAL ORGANISM REFLEX

## 2023-05-08 LAB — ACID FAST CULTURE WITH REFLEXED SENSITIVITIES (MYCOBACTERIA): Acid Fast Culture: NEGATIVE

## 2023-05-14 ENCOUNTER — Telehealth: Payer: Self-pay | Admitting: Acute Care

## 2023-05-14 ENCOUNTER — Ambulatory Visit (INDEPENDENT_AMBULATORY_CARE_PROVIDER_SITE_OTHER): Payer: Medicare PPO | Admitting: Acute Care

## 2023-05-14 ENCOUNTER — Encounter: Payer: Self-pay | Admitting: Acute Care

## 2023-05-14 VITALS — BP 118/68 | HR 69 | Temp 98.0°F | Ht 68.0 in | Wt 174.2 lb

## 2023-05-14 DIAGNOSIS — C3410 Malignant neoplasm of upper lobe, unspecified bronchus or lung: Secondary | ICD-10-CM | POA: Diagnosis not present

## 2023-05-14 DIAGNOSIS — R042 Hemoptysis: Secondary | ICD-10-CM

## 2023-05-14 MED ORDER — HYDROCODONE BIT-HOMATROP MBR 5-1.5 MG/5ML PO SOLN
5.0000 mL | Freq: Every evening | ORAL | 0 refills | Status: DC | PRN
Start: 2023-05-14 — End: 2023-08-30

## 2023-05-14 NOTE — Progress Notes (Signed)
History of Present Illness [name] [name] is a 77 y.o. male former smoker with history of stage I primary bronchogenic carcinoma, treatment with SBRT, severe coronary disease, history of AVR on Coumadin, heart failure on Entresto.  He is followed by Dr. Tonia Brooms. He was referred back by Dr. Roselind Messier for hemoptysis and imaging changes.   Synopsis [name], with severe coronary disease, history of AVR on Coumadin, heart failure on Entresto.  Underwent previous navigational bronchoscopy for a stage I primary bronchogenic carcinoma.  Had SBRT treatments 02/18/2021 through 02/25/2021 and has done well since then.  He has an area of scarring in his lingula that has remained persistent since radiation treatments but now has a progressive infiltrate in the left lower lobe with intermittent bouts of small hemoptysis.   Since he has had a nonresolving infiltrate in the left lower lobe with associated hemoptysis the concern is underlying DAH. It is possible that this is related to his ongoing anticoagulation with Coumadin.  This makes it very difficult situation as he needs to be anticoagulated due to his AVR.   Dr. Tonia Brooms discussed case with radiation oncology as well as Dr. Myrla Halsted   Dr. Katrinka Blazing was  willing to do bronchoscopy with BAL serial BAL's to rule out DAH. Specimens were sent for culture, respiratory culture, fungus and AFB.   We also  send for cell count analysis with differential.     Dr. Katrinka Blazing did a bronchoscopy with BAL on 03/19/2023 to evaluate source of hemoptysis and to rule out DAH. Per the OP note Right lung was clear, only notable finding was RUL bronchial pitting. The LLL oozing was noted. Serial BAL did not darken . Dr. Katrinka Blazing felt this was more consistent with a small airway bleed rather than alveolitis. He Questioned injury related to nearby SBRT.  He was seen 03/24/2023, CBC stable, given Hycodan for cough suppression. He was seen by Dr. Delton Coombes 04/21/2023, who had  patient scheduled for a follow up as BAL results done 03/19/2023 with bronch were still pending at the time of the last visit.  05/14/2023 Pt. Presents for  follow up. We reviewed his cultures done 03/19/2023. There was no fungal or anaerobic growth. AFB was negative as were BAL CX.  The patient is still having hemoptysis. The blood is dark. Other secretions are clear. He denies any fever or chills, no shortness of breath. He is on Coumadin for [facility] AVR. INR goal is 2.5-3. Last INR was 2.6, but they have been as high as 3.9.  Out of the utmost caution we will do a repeat CT Chest to ensure there has been no changes in imaging. I will renew his cough Supressant at bedtime. He did feel the cough supression helped decrease the hemoptysis.He understands if the blood increases in quantity, or becomes bright red he needs to call to be seen or seek emergency care.   Test Results: BAL Cultures 03/19/2023 Fungal >> No fungus observed Respiratory Cx>>No anaerobes isolated AFB>> Negative Respiratory Cx >> No Growth  Cell count analysis with differential. 03/19/2023 Fluid Type-FCT BRONCHIAL ALVEOLAR LAVAGE VC  Comment: CORRECTED ON 04/12 AT 1547: PREVIOUSLY REPORTED AS OTHER  Color, Fluid YELLOW PINK Abnormal   Appearance, Fluid CLEAR HAZY Abnormal   Total Nucleated Cell Count, Fluid 0 - 1,000 cu mm 37  Comment: COUNT MAY BE INACCURATE DUE TO FIBRIN CLUMPS.  Neutrophil Count, Fluid 0 - 25 % 49 High   Lymphs, Fluid % 46  Monocyte-Macrophage-Serous  Fluid 50 - 90 % 4 Low   Eos, Fluid % 1       Latest Ref Rng & Units 03/24/2023    2:21 PM 03/19/2023    6:49 AM 02/25/2023   12:04 PM  CBC  WBC 4.0 - 10.5 K/uL 9.3  8.2  8.1   Hemoglobin 13.0 - 17.0 g/dL 16.1  09.6  04.5   Hematocrit 39.0 - 52.0 % 53.6  51.2  49.2   Platelets 150.0 - 400.0 K/uL 188.0  192  215        Latest Ref Rng & Units 03/19/2023    6:49 AM 03/09/2023    1:37 PM 02/25/2023   12:04 PM  BMP  Glucose 70 - 99 mg/dL 409  811   914   BUN 8 - 23 mg/dL 36  27  25   Creatinine 0.61 - 1.24 mg/dL 7.82  9.56  2.13   Sodium 135 - 145 mmol/L 138  138  135   Potassium 3.5 - 5.1 mmol/L 4.6  4.4  4.9   Chloride 98 - 111 mmol/L 100  99  98   CO2 22 - 32 mmol/L 29  29  25    Calcium 8.9 - 10.3 mg/dL 9.1  8.9  8.9     BNP    Component Value Date/Time   BNP 376.3 (H) 02/25/2023 1204    ProBNP    Component Value Date/Time   PROBNP 3,116 (H) 10/02/2019 1131    PFT No results found for: "FEV1PRE", "FEV1POST", "FVCPRE", "FVCPOST", "TLC", "DLCOUNC", "PREFEV1FVCRT", "PSTFEV1FVCRT"  No results found.   Past medical hx Past Medical History:  Diagnosis Date   Adenomatous colon polyp 09/1997   Anemia    Aortic stenosis    s/p [facility] jude mechanical AVR - Chronic Coumadin   Blood transfusion    "related to ITP"   CHF (congestive heart failure) (HCC)    COPD (chronic obstructive pulmonary disease) (HCC)    patient denies this dx on 01/27/21   Coronary artery disease    s/p cabg x 3 11/2003: lima-lad, seq vg to rpda and rpl   Diverticulitis of colon    Dysrhythmia    a-fib   GERD (gastroesophageal reflux disease)    patient states he has never had reflux   Heart murmur    History of radiation therapy 02/18/2021, 02/20/2021, 02/25/2021   SBRT to left lung     Dr Antony Blackbird   Hyperlipidemia    Hypertension    ITP (idiopathic thrombocytopenic purpura)    Lung cancer (HCC)    last week   Peripheral arterial disease (HCC)    a. history of aortobifemoral bypass grafting by Dr. Tawanna Cooler early b. LE angiography 04/22/2015 patent aortobifem graft, DES to R SFA   Peripheral vascular disease (HCC)    s/p Left external Iliac Artery stenting and subsequent left femoral endarterectomy 02/2011- post op course complicated by wound infxn req I&D 03/2011   Renal artery stenosis, native, bilateral (HCC)    a. bilateral renal artery stenosis by recent duplex ultrasound b. L renal artery stent 02/2015, R renal artery patent on angiogram    Stroke (HCC) 2015   TIA (transient ischemic attack) ~ 2013   Type II diabetes mellitus (HCC)      Social History   Tobacco Use   Smoking status: Former    Packs/day: 1.50    Years: 30.00    Additional pack years: 0.00    Total pack years: 45.00  Types: Cigarettes, Cigars    Quit date: 12/15/1993    Years since quitting: 29.4   Smokeless tobacco: Never   Tobacco comments:    occasional cigar  Vaping Use   Vaping Use: Never used  Substance Use Topics   Alcohol use: Yes    Alcohol/week: 15.0 standard drinks of alcohol    Types: 1 Cans of beer, 7 Shots of liquor, 7 Standard drinks or equivalent per week    Comment: drinks 1 martini's a night (2 shots)   Drug use: No    [name] [name] reports that he quit smoking about 29 years ago. His smoking use included cigarettes and cigars. He has a 45.00 pack-year smoking history. He has never used smokeless tobacco. He reports current alcohol use of about 15.0 standard drinks of alcohol per week. He reports that he does not use drugs.  Tobacco Cessation: Former smoker , quit 1995 with a 45 pack year smoking history  Past surgical hx, Family hx, Social hx all reviewed.  Current Outpatient Medications on File Prior to Visit  Medication Sig   atorvastatin (LIPITOR) 80 MG tablet Take 80 mg by mouth daily.   Blood Glucose Monitoring Suppl (ONETOUCH VERIO IQ SYSTEM) w/Device KIT    carvedilol (COREG) 3.125 MG tablet Take 1 tablet (3.125 mg total) by mouth 2 (two) times daily.   enoxaparin (LOVENOX) 80 MG/0.8ML injection Inject 0.8 mLs (80 mg total) into the skin every 12 (twelve) hours. As instructed by Coumadin Clinic   ENTRESTO 24-26 MG TAKE 1 TABLET BY MOUTH TWICE A DAY   Evolocumab 140 MG/ML SOAJ Inject 140 mg into the skin every 14 (fourteen) days.   furosemide (LASIX) 20 MG tablet 60 mg daily alternating with 40 mg daily. (Patient taking differently: Take 40-60 mg by mouth See admin instructions. 60 mg daily alternating with 40 mg daily.)    glucose blood (ONETOUCH VERIO) test strip    glyBURIDE (DIABETA) 2.5 MG tablet Take 5 mg by mouth 2 (two) times daily with a meal.   JARDIANCE 25 MG TABS tablet Take 25 mg by mouth daily.   nitroGLYCERIN (NITROSTAT) 0.4 MG SL tablet Place 1 tablet (0.4 mg total) under the tongue every 5 (five) minutes x 3 doses as needed for chest pain.   OneTouch Delica Lancets 33G MISC    spironolactone (ALDACTONE) 25 MG tablet TAKE 1 TABLET BY MOUTH EVERY DAY   warfarin (COUMADIN) 5 MG tablet TAKE 1 TO 1 AND 1/2 TABLETS BY MOUTH DAILY AS PRESCRIBED BY THE COUMADIN CLINIC. (Patient taking differently: Take 5-7.5 mg by mouth See admin instructions. Take 10 mg on Tuesday and Saturday, all the days take 7.5 mg  as prescribed by the coumadin clinic.)   INVOKANA 100 MG TABS tablet Take 100 mg by mouth daily. (Patient not taking: Reported on 04/21/2023)   No current facility-administered medications on file prior to visit.     No Known Allergies  Review Of Systems:  Constitutional:   No  weight loss, night sweats,  Fevers, chills, fatigue, or  lassitude.  HEENT:   No headaches,  Difficulty swallowing,  Tooth/dental problems, or  Sore throat,                No sneezing, itching, ear ache, nasal congestion, post nasal drip,   CV:  No chest pain,  Orthopnea, PND, swelling in lower extremities, anasarca, dizziness, palpitations, syncope.   GI  No heartburn, indigestion, abdominal pain, nausea, vomiting, diarrhea, change in bowel habits, loss of appetite,  bloody stools.   Resp: No shortness of breath with exertion or at rest.  No excess mucus, no productive cough,  No non-productive cough,  + coughing up of blood.  No change in color of mucus.  No wheezing.  No chest wall deformity  Skin: no rash or lesions.  GU: no dysuria, change in color of urine, no urgency or frequency.  No flank pain, no hematuria   MS:  No joint pain or swelling.  No decreased range of motion.  No back pain.  Psych:  No change in  mood or affect. No depression or anxiety.  No memory loss.   Vital Signs BP 118/68   Pulse 69   Temp 98 F (36.7 C)   Ht 5\' 8"  (1.727 m)   Wt 174 lb 3.2 oz (79 kg)   SpO2 94%   BMI 26.49 kg/m    Physical Exam:  General- No distress,  A&Ox3, pleasant ENT: No sinus tenderness, TM clear, pale nasal mucosa, no oral exudate,no post nasal drip, no LAN Cardiac: S1, S2, regular rate and rhythm, no murmur Chest: No wheeze/ rales/ dullness; no accessory muscle use, no nasal flaring, no sternal retractions Abd.: Soft Non-tender, ND, BS +, Body mass index is 26.49 kg/m.  Ext: No clubbing cyanosis, edema Neuro:  normal strength, MAE x 4, A&O x 3 Skin: No rashes, warm and dry,. 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 You will get a call to get this scheduled.  This may be related to to your blood thinner but I want to make sure nothing has changed with your CT Chest. I have sent in an additional prescription for Hycodan for cough. Please take this as needed at bedtime for cough. One teaspoon or 5 cc's.  This will make you sleepy, so do not drive after taking. Continue Robitussin during the day as you were doing. If bleeding increases in volume of becomes bright red, call the office or seek emergency care  Follow up with Liboria Putnam NP or Dr. Tonia Brooms after CT Chest Please contact office for sooner follow up if symptoms do not improve or worsen or seek emergency care    I spent 33 minutes dedicated to the care of this patient on the date of  this encounter to include pre-visit review of records, face-to-face time with the patient discussing conditions above, post visit ordering of testing, clinical documentation with the electronic health record, making appropriate referrals as documented, and communicating necessary information to the patient's healthcare team.   Bevelyn Ngo, NP 05/14/2023  8:49 AM

## 2023-05-14 NOTE — Patient Instructions (Addendum)
It is good to see you today. We will check a CT Chest to evaluate why you are still having blood in your sputum. You will get a call to get this scheduled.  This may be related to to your blood thinner but I want to make sure nothing has changed with your CT Chest. I have sent in an additional prescription for Hycodan for cough. Please take this as needed at bedtime for cough. One teaspoon or 5 cc's.  This will make you sleepy, so do not drive after taking. Continue Robitussin during the day as you were doing. Follow up with Maralyn Sago NP or Dr. Tonia Brooms after CT Chest Please contact office for sooner follow up if symptoms do not improve or worsen or seek emergency care

## 2023-05-14 NOTE — Telephone Encounter (Signed)
  For next week>> This is the guy who had lung cancer 2/22, got SBRT, and was referred back to [place] for some changed on CT and hemoptysis. Dr. Katrinka Blazing bronched him , did not feel the hemoptysis was 2/2 DAH, but more likely lung damage due to SBRT. Bronch showed , no malignant cells. He is still couging up old looking blood. Cultures were all negative. cell count from BAL was Cell count analysis with differential. 03/19/2023 Fluid Type-FCT BRONCHIAL ALVEOLAR LAVAGE VC Comment: CORRECTED ON 04/12 AT 1547: PREVIOUSLY REPORTED AS OTHER Color, Fluid YELLOW PINK Abnormal  Appearance, Fluid CLEAR HAZY Abnormal  Total Nucleated Cell Count, Fluid 0 - 1,000 cu mm 37 Comment: COUNT MAY BE INACCURATE DUE TO FIBRIN CLUMPS. Neutrophil Count, Fluid 0 - 25 % 49 High  Lymphs, Fluid % 46 Monocyte-Macrophage-Serous Fluid 50 - 90 % 4 Low  Eos, Fluid % 1 I am re-scanning his lungs. Do you think this could be DAH as it is not better? I have prescribed more cough suppression, and follow up after the scan. What do you think?

## 2023-05-20 ENCOUNTER — Ambulatory Visit (HOSPITAL_BASED_OUTPATIENT_CLINIC_OR_DEPARTMENT_OTHER)
Admission: RE | Admit: 2023-05-20 | Discharge: 2023-05-20 | Disposition: A | Discharge status: Home / Self Care | Payer: Medicare PPO | Source: Ambulatory Visit | Attending: Acute Care | Admitting: Acute Care

## 2023-05-20 DIAGNOSIS — R042 Hemoptysis: Secondary | ICD-10-CM | POA: Insufficient documentation

## 2023-05-20 DIAGNOSIS — J439 Emphysema, unspecified: Secondary | ICD-10-CM | POA: Diagnosis not present

## 2023-05-20 DIAGNOSIS — R911 Solitary pulmonary nodule: Secondary | ICD-10-CM | POA: Diagnosis not present

## 2023-05-25 ENCOUNTER — Encounter: Payer: Self-pay | Admitting: Acute Care

## 2023-05-25 ENCOUNTER — Encounter: Payer: Medicare PPO | Admitting: Acute Care

## 2023-05-25 NOTE — Progress Notes (Signed)
PCP: Shon Baton, MD HF Cardiology: Dr. Jon Billings [name] [name] is a 77 y.o. male with a history of CAD s/p CABG, mechanical AVR on coumadin, extensive PAD, DM, HTN, HLD, ITP, TIA, and recurrent GI bleed. Note, requires lovenox bridging off heparin.   Dr. Gwenlyn Found follows him for extensive PAD. He has had an aortobifem bypass in 2016 with follow up iliac stenting and femoral endarterectomy. In 2017, he had subsequent ostial and mid right SFA intervention. Hx of left renal artery stenting in 2016 with repeat intervention on left for 75% in-stent restenosis, with progression of disease on the right renal artery showing 60% stenosis on duplex 07/12/19. Echo 09/17/19 with normal EF of 60-65% and normal AVR function; chronic diastolic heart failure.    Admitted in 10/20 with chest pain. He underwent heart cath 09/18/19 that showed 99% left main stenosis and patent LIMA to occluded LAD and a patent sequential vein to the PDA and PLA of an occluded dominant right. His left main stenosis jeopardized the moderately large ramus and high first marginal and nondominant Cx which has 90% mid AV groove Cx stenosis. Aggressive medical therapy was recommended initially.  He was readmitted in 10/20 with CHF and chest pain. Hospital course complicated by A flutter/low output heart failure.  S/P successful orbital atherectomy, PTCA, and stenting of protected left main with a 4.0x15 mm resolute onyx DES 11/3. LIMA-LAD patent. SVG-PDA patent. ECHO this admission showed EF 30-35%, mechanical AoV ok. TEE 11/9 w/ improved EF, 40-45%. Placed on milrinone initially with low co-ox and later weaned off.  He was cardioverted from atrial flutter back to NSR.   2/21 peripheral arterial dopplers showed 75-99% stenosis right SFA.  In 3/21, he had stenting to right SFA.    Echo in 6/21 showed EF up to 55-60%.    In 12/21, he had an atrial fibrillation ablation.   He was admitted in 1/22 with shortness of breath and was found to be  in atrial fibrillation with RVR. He was hypertensive.  Troponin was negative. He was diuresed and discharged, cardiology was not notified.  Echo during this admission showed EF 60-65%, normal RV size and systolic function, stable mechanical aortic valve.   During the 1/22 admission, CT chest showed a lung nodule.  Followup PET-CT suggestive of lung cancer, biopsy with squamous cell lung cancer.  He underwent radiation therapy, now completed.   DCCV to NSR in 1/22.   He has had episodes concerning for vertebrobasilar TIAs.  Significant disease noted in the vertebral arteries.  He was seen by neurology who wanted him referred to interventional neuroradiology for angiography with possible PCI to the vertebrals. It does not appear that this ever occurred.   Echo in 1/24 showed EF 45-50%, basal to mid inferolateral hypokinesis, mildly decreased RV systolic function, mechanical aortic valve with mean gradient 6 mmHg.    Today he returns for HF follow up.  Weight is up 7 lbs.  No chest pain. No claudication.  He has noted more dyspnea with moderate exertion such as walking up hills and inclines when he plays golf. Generally ok on flat ground.  He has some balance difficulty but no falls. No BRBPR/melena. No orthopnea/PND.  No palpitations.   ECG (personally reviewed): NSR, 1st degree AVB 238 msec, nonspecific lateral TWIs  REDS clip 34%  Labs (11/20): K 4.3, creatinine 1.3, hgb 12.5 Labs (12/20): LDL 56, HDL 80, TGs 179 Labs (1/21): K 4.8, creatinine 1.45 Labs (4/21): K 5.2, creatinine 1.34 Labs (  6/21): LDL 44 Labs (1/22): K 4, creatinine 1.3 Labs (3/22): K 4.4, creatinine 1.02 Labs (3/23): K 4.4, creatinine 1.38 Labs (9/23): LDL 63, TGs 103 Labs (12/23): K 4.1, creatinine 1.16  PMH  1. CAD:  S/P CABG (3/04) with LIMA-LAD, seq SVG-PDA/PLV.  - NSTEMI 10/20 with cath showing patent LIMA-LAD and SVG-PDA/PLV but 99% LM stenosis, occluded LAD, moderate ramus and high OM1 jeopardized, also 90% mid AV  groove LCx stenosis.  Occluded RCA.  Patient ultimately had orbital atherectomy and stenting of left main with a 4.0 x 15 mm Resolute Onyx DES.  2. Mechanical aortic valve: TEE 11/20 showed stable-appearing mechanical valve, mean gradient 9 mmHg.  3. PAD: Aortobifemoral bypass in 2016 with follow up iliac stenting and femoral endarterectomy. In 2017, he had subsequent ostial and mid right SFA intervention. - Angiography 10/20 with 80% in-stent restenosis in proximal right SFA.  - Peripheral arterial dopplers (2/21) with 75-99% R SFA stenosis in prior stented area.  - 3/21 Right SFA stent (Dr. Allyson Sabal).  - ABIs (1/22): Stable mild left SFA disease.  - Peripheral arterial dopplers (2/23): 50-99% proximal R SFA in-stent restenosis.  Medical management for now.  - Peripheral arterial dopplers (3/24): 50-99% proximal R SFA in-stent restenosis.  4. Type 2 diabetes.  5. HTN - Renal artery dopplers (8/21): No significant stenosis.  6. Hyperlipidemia 7. H/o ITP  8. TIAs  9. Chronic Systolic HF: Ischemic cardiomyopathy.  - Echo (10/20): EF 30-35%, mechanical AoV ok.  - TEE (11/20): w/ improved EF, 40-45%. Mechanical aortic valve with mean gradient 9 mmHg, normal RV.  - Echo (6/21): EF 55-60%, mild LVH, mechanical AoV with mean gradient 9 mmHg.  - Echo (1/22): EF 60-65%, normal RV size and systolic function, stable mechanical aortic valve.  - Echo (1/24): EF 45-50%, basal to mid inferolateral hypokinesis, mildly decreased RV systolic function, mechanical aortic valve with mean gradient 6 mmHg. 10. Atrial fibrillation: Paroxysmal.  - Atrial fibrillation ablation 12/21.  - DCCV 1/22 11. Renal artery stenosis: Hx of left renal artery stenting in 2016 with repeat intervention on left for 75% in-stent restenosis, with progression of disease on the right renal artery showing 60% stenosis on duplex 07/12/19. - Renal artery dopplers (8/23): 1-59% bilateral RAS.  12. Carotid stenosis: 4/21 carotid dopplers with  right subclavian stenosis, 40-59% RICA stenosis.  - Carotid dopplers (4/22): 1-39% BICA stenosis.  13. Squamous cell lung cancer: Treated with XRT.     ROS: All systems negative except as listed in HPI, PMH and Problem List.  Social History   Socioeconomic History   Marital status: Divorced    Spouse name: Not on file   Number of children: 3   Years of education: Not on file   Highest education level: Not on file  Occupational History   Occupation: Retired  Tobacco Use   Smoking status: Former    Packs/day: 1.50    Years: 30.00    Additional pack years: 0.00    Total pack years: 45.00    Types: Cigarettes, Cigars    Quit date: 12/15/1993    Years since quitting: 29.4   Smokeless tobacco: Never   Tobacco comments:    occasional cigar  Vaping Use   Vaping Use: Never used  Substance and Sexual Activity   Alcohol use: Yes    Alcohol/week: 15.0 standard drinks of alcohol    Types: 1 Cans of beer, 7 Shots of liquor, 7 Standard drinks or equivalent per week    Comment: drinks  1 martini's a night (2 shots)   Drug use: No   Sexual activity: Yes  Other Topics Concern   Not on file  Social History Narrative   Tries to remain active.  Frequent golfer but claudication limits this.   1 child has passed   Social Determinants of Corporate investment banker Strain: Not on file  Food Insecurity: Not on file  Transportation Needs: Not on file  Physical Activity: Not on file  Stress: Not on file  Social Connections: Not on file  Intimate Partner Violence: Not on file   Family History  Problem Relation Age of Onset   Coronary artery disease Mother        bypass surgery - deceased   Heart disease Father        murmur, valve replacement - deceased   Breast cancer Sister    Diabetes Other        grandmother   Diabetes Paternal Grandmother    Diabetes Paternal Aunt    Colon cancer Neg Hx    Colon polyps Neg Hx    Esophageal cancer Neg Hx    Rectal cancer Neg Hx    Stomach  cancer Neg Hx    Current Outpatient Medications  Medication Sig Dispense Refill   atorvastatin (LIPITOR) 80 MG tablet Take 80 mg by mouth daily.     Blood Glucose Monitoring Suppl (ONETOUCH VERIO IQ SYSTEM) w/Device KIT      carvedilol (COREG) 3.125 MG tablet Take 1 tablet (3.125 mg total) by mouth 2 (two) times daily. 180 tablet 3   enoxaparin (LOVENOX) 80 MG/0.8ML injection Inject 0.8 mLs (80 mg total) into the skin every 12 (twelve) hours. As instructed by Coumadin Clinic 8 mL 0   ENTRESTO 24-26 MG TAKE 1 TABLET BY MOUTH TWICE A DAY 60 tablet 6   Evolocumab 140 MG/ML SOAJ Inject 140 mg into the skin every 14 (fourteen) days.     furosemide (LASIX) 20 MG tablet 60 mg daily alternating with 40 mg daily. (Patient taking differently: Take 40-60 mg by mouth See admin instructions. 60 mg daily alternating with 40 mg daily.) 180 tablet 3   glucose blood (ONETOUCH VERIO) test strip      glyBURIDE (DIABETA) 2.5 MG tablet Take 5 mg by mouth 2 (two) times daily with a meal.     HYDROcodone bit-homatropine (HYCODAN) 5-1.5 MG/5ML syrup Take 5 mLs by mouth at bedtime as needed for cough. 240 mL 0   INVOKANA 100 MG TABS tablet Take 100 mg by mouth daily. (Patient not taking: Reported on 04/21/2023)     JARDIANCE 25 MG TABS tablet Take 25 mg by mouth daily.     nitroGLYCERIN (NITROSTAT) 0.4 MG SL tablet Place 1 tablet (0.4 mg total) under the tongue every 5 (five) minutes x 3 doses as needed for chest pain. 25 tablet 12   OneTouch Delica Lancets 33G MISC      spironolactone (ALDACTONE) 25 MG tablet TAKE 1 TABLET BY MOUTH EVERY DAY 90 tablet 3   warfarin (COUMADIN) 5 MG tablet TAKE 1 TO 1 AND 1/2 TABLETS BY MOUTH DAILY AS PRESCRIBED BY THE COUMADIN CLINIC. (Patient taking differently: Take 5-7.5 mg by mouth See admin instructions. Take 10 mg on Tuesday and Saturday, all the days take 7.5 mg  as prescribed by the coumadin clinic.) 120 tablet 1   No current facility-administered medications for this visit.    There were no vitals taken for this visit.  Wt Readings from Last  3 Encounters:  05/14/23 79 kg (174 lb 3.2 oz)  04/21/23 77.5 kg (170 lb 12.8 oz)  03/24/23 76.3 kg (168 lb 3.2 oz)   PHYSICAL EXAM: General: NAD Neck: JVP 8 cm with HJR, no thyromegaly or thyroid nodule.  Lungs: Clear to auscultation bilaterally with normal respiratory effort. CV: Nondisplaced PMI.  Heart regular S1/S2 with mechanical S2, no S3/S4, no murmur.  No peripheral edema.  No carotid bruit.  Unable to palpate pedal pulses.  Abdomen: Soft, nontender, no hepatosplenomegaly, no distention.  Skin: Intact without lesions or rashes.  Neurologic: Alert and oriented x 3.  Psych: Normal affect. Extremities: No clubbing or cyanosis.  HEENT: Normal.   ASSESSMENT & PLAN: 1. Chronic HF with mid range EF: Ischemic cardiomyopathy.  Echo 10/06/19 showed EF 30-35% and wall motion abnormalities, prior echo earlier in 10/20 showed EF 50-55%.  Cath 10/20 showed jeopardized ramus and LCx territory (99% distal left main, occluded LAD, patent LIMA-LAD). Concerned that ischemia in this territory triggered CHF, worsening of LV function.  Now s/p DES to left main, TEE in 11/20 after intervention showed EF higher at 40-45%.  Echo in 1/22 showed EF 60-65% with normal RV. Echo in 1/24 showed  EF 45-50%, basal to mid inferolateral hypokinesis, mildly decreased RV systolic function, mechanical aortic valve with mean gradient 6 mmHg.  Increased exertional dyspnea recently, NYHA class II-III, with weight gain.  Mild volume overload on exam with borderline REDS clip.  - Increase Lasix to 60 mg daily alternating with 40 mg daily.  BMET/BNP today and BMET in 10 days.  - Continue Entresto 24/26 bid.   - Continue spironolactone 25 mg daily. - He is on canagliflozin.    - Add Coreg 3.125 mg bid.  2. CAD: Complicated disease, coronary angiography 10/20 showed patent SVG-RCA territory and patent LIMA-LAD.  The proximal LAD was occluded and there was 99%  distal left main stenosis.  This left the ramus and LCx in jeopardy.  He was not a good candidate for protected PCI => cannot place Impella with mechanical aortic valve and peripheral vascular disease likely precludes a femoral IABP.  It was decided to try to manage him medically.  However, he returned with progressive chest pain episodes and NSTEMI, hs-TnI up to 935. S/p successful orbital atherectomy and stenting of the left main with a 4.0x15 mm Resolute Onyx DES 10/10/19.  EF on echo in 1/24 was lower at 45-50% but no chest pain.   - I do not think that there is an indication at this point with repeat angiography.  - Will not start him on ASA 81 as he has had GI bleeding x 2 when ASA was added to warfarin in the past.  - Continue warfarin for mechanical valve.    - Continue atorvastatin 80 daily + Repatha.  Good lipids in 11/23.   3. Mechanical aortic valve: Stable on 1/24 echo. Crisp mechanical sounds on exam. - Will not use ASA given GI bleeding on combination of ASA and warfarin.  - Continue warfarin, goal INR 2.5-3.5 with episodes concerning for TIAs.  4. NSVT: Noted 10/20 admission. 5. H/o GI bleeding: Continue PPI for GI protection. - CBC today.  6. PAD: Patient with aorto-bifemoral bypass, h/o iliac stenting, h/o femoral endarterectomy, PCI to right SFA in 2017.  Angiography in 10/20 with 80% ISR in right SFA.  He had PCI to right SFA in 3/21, no claudication now.  Repeat ABIs in 1/22 with mild left SFA disease. Peripheral arterial dopplers  in 2/23 showed 50-99% in-stent restenosis in the right SFA stent, repeat dopplers in 3/24 were unchanged.  Minimal claudication.  - Medical management for now per Dr. Allyson Sabal.  7. Carotid/subclavian stenosis: Mild disease on dopplers in 4/22.  - Followed at [facility].  8. DM2: He is on SGLT2 inhibitor (canagliflozin).    9. Atrial fibrillation/atypical flutter:  TEE-guided DCCV in 11/20, atrial fibrillation ablation in 12/21.  Admitted with CHF exacerbation in  1/22 in the setting of recurrence of atrial fibrillation.  Atypical flutter with DCCV in 1/22.  He is off amiodarone. NSR today, no palpitations.  - Continue coumadin.  10. TIAs:  Saw neurology, has been noted to have severe vertebral artery disease.   - Continue statin/Repatha.  - With TIAs,  INR goal to 2.5-3.5 for mechanical valve.  Off ASA with GI bleeding.   Follow up with APP in 3 months.   Anderson Malta [facility]  05/25/2023

## 2023-05-26 ENCOUNTER — Encounter (HOSPITAL_COMMUNITY): Payer: Self-pay

## 2023-05-26 ENCOUNTER — Ambulatory Visit (INDEPENDENT_AMBULATORY_CARE_PROVIDER_SITE_OTHER): Payer: Medicare PPO

## 2023-05-26 ENCOUNTER — Ambulatory Visit (HOSPITAL_COMMUNITY)
Admission: RE | Admit: 2023-05-26 | Discharge: 2023-05-26 | Disposition: A | Discharge status: Home / Self Care | Payer: Medicare PPO | Source: Ambulatory Visit | Attending: Family Medicine | Admitting: Family Medicine

## 2023-05-26 VITALS — BP 120/1 | HR 69 | Wt 171.0 lb

## 2023-05-26 DIAGNOSIS — I4729 Other ventricular tachycardia: Secondary | ICD-10-CM

## 2023-05-26 DIAGNOSIS — G459 Transient cerebral ischemic attack, unspecified: Secondary | ICD-10-CM

## 2023-05-26 DIAGNOSIS — E1151 Type 2 diabetes mellitus with diabetic peripheral angiopathy without gangrene: Secondary | ICD-10-CM | POA: Diagnosis not present

## 2023-05-26 DIAGNOSIS — I255 Ischemic cardiomyopathy: Secondary | ICD-10-CM | POA: Diagnosis not present

## 2023-05-26 DIAGNOSIS — I25810 Atherosclerosis of coronary artery bypass graft(s) without angina pectoris: Secondary | ICD-10-CM

## 2023-05-26 DIAGNOSIS — Z5181 Encounter for therapeutic drug level monitoring: Secondary | ICD-10-CM

## 2023-05-26 DIAGNOSIS — I359 Nonrheumatic aortic valve disorder, unspecified: Secondary | ICD-10-CM

## 2023-05-26 DIAGNOSIS — I480 Paroxysmal atrial fibrillation: Secondary | ICD-10-CM | POA: Diagnosis not present

## 2023-05-26 DIAGNOSIS — Z79899 Other long term (current) drug therapy: Secondary | ICD-10-CM | POA: Diagnosis not present

## 2023-05-26 DIAGNOSIS — I5042 Chronic combined systolic (congestive) and diastolic (congestive) heart failure: Secondary | ICD-10-CM | POA: Diagnosis not present

## 2023-05-26 DIAGNOSIS — I472 Ventricular tachycardia, unspecified: Secondary | ICD-10-CM | POA: Insufficient documentation

## 2023-05-26 DIAGNOSIS — Z951 Presence of aortocoronary bypass graft: Secondary | ICD-10-CM | POA: Insufficient documentation

## 2023-05-26 DIAGNOSIS — Z7984 Long term (current) use of oral hypoglycemic drugs: Secondary | ICD-10-CM | POA: Insufficient documentation

## 2023-05-26 DIAGNOSIS — Z955 Presence of coronary angioplasty implant and graft: Secondary | ICD-10-CM | POA: Insufficient documentation

## 2023-05-26 DIAGNOSIS — I6529 Occlusion and stenosis of unspecified carotid artery: Secondary | ICD-10-CM | POA: Diagnosis not present

## 2023-05-26 DIAGNOSIS — I2510 Atherosclerotic heart disease of native coronary artery without angina pectoris: Secondary | ICD-10-CM | POA: Diagnosis not present

## 2023-05-26 DIAGNOSIS — Z833 Family history of diabetes mellitus: Secondary | ICD-10-CM | POA: Diagnosis not present

## 2023-05-26 DIAGNOSIS — Z7901 Long term (current) use of anticoagulants: Secondary | ICD-10-CM | POA: Diagnosis not present

## 2023-05-26 DIAGNOSIS — Z8673 Personal history of transient ischemic attack (TIA), and cerebral infarction without residual deficits: Secondary | ICD-10-CM | POA: Diagnosis not present

## 2023-05-26 DIAGNOSIS — I739 Peripheral vascular disease, unspecified: Secondary | ICD-10-CM

## 2023-05-26 DIAGNOSIS — I110 Hypertensive heart disease with heart failure: Secondary | ICD-10-CM | POA: Diagnosis not present

## 2023-05-26 DIAGNOSIS — Z952 Presence of prosthetic heart valve: Secondary | ICD-10-CM | POA: Diagnosis not present

## 2023-05-26 DIAGNOSIS — Z8249 Family history of ischemic heart disease and other diseases of the circulatory system: Secondary | ICD-10-CM | POA: Insufficient documentation

## 2023-05-26 DIAGNOSIS — I779 Disorder of arteries and arterioles, unspecified: Secondary | ICD-10-CM | POA: Diagnosis not present

## 2023-05-26 DIAGNOSIS — Z87891 Personal history of nicotine dependence: Secondary | ICD-10-CM | POA: Diagnosis not present

## 2023-05-26 DIAGNOSIS — E1159 Type 2 diabetes mellitus with other circulatory complications: Secondary | ICD-10-CM | POA: Diagnosis not present

## 2023-05-26 DIAGNOSIS — I5022 Chronic systolic (congestive) heart failure: Secondary | ICD-10-CM | POA: Diagnosis not present

## 2023-05-26 LAB — BASIC METABOLIC PANEL
Anion gap: 10 (ref 5–15)
BUN: 22 mg/dL (ref 8–23)
CO2: 25 mmol/L (ref 22–32)
Calcium: 9 mg/dL (ref 8.9–10.3)
Chloride: 102 mmol/L (ref 98–111)
Creatinine, Ser: 1.17 mg/dL (ref 0.61–1.24)
GFR, Estimated: >60 mL/min (ref >60)
Glucose, Bld: 161 mg/dL — ABNORMAL HIGH (ref 70–99)
Potassium: 4.3 mmol/L (ref 3.5–5.1)
Sodium: 137 mmol/L (ref 135–145)

## 2023-05-26 LAB — PROTIME-INR
INR: 2 — ABNORMAL HIGH (ref 0.8–1.2)
Prothrombin Time: 22.7 seconds — ABNORMAL HIGH (ref 11.4–15.2)

## 2023-05-26 NOTE — Patient Instructions (Signed)
Medication Changes:  We recommend that you continue on your current medications as directed. Please refer to the Current Medication list given to you today.   *If you need a refill on your cardiac medications before your next appointment, please call your pharmacy*  Lab Work:  Labs done today, your results will be available in MyChart, we will contact you for abnormal readings.   Follow-Up in:   Your physician recommends that you schedule a follow-up appointment in: 3 months with Dr. Mclean    Do the following things EVERYDAY: Weigh yourself in the morning before breakfast. Write it down and keep it in a log. Take your medicines as prescribed Eat low salt foods--Limit salt (sodium) to 2000 mg per day.  Stay as active as you can everyday Limit all fluids for the day to less than 2 liters    Need to Contact Us:  If you have any questions or concerns before your next appointment please send us a message through mychart or call our office at [phone].    TO LEAVE A MESSAGE FOR THE NURSE SELECT OPTION 2, PLEASE LEAVE A MESSAGE INCLUDING: YOUR NAME DATE OF BIRTH CALL BACK NUMBER REASON FOR CALL**this is important as we prioritize the call backs  YOU WILL RECEIVE A CALL BACK THE SAME DAY AS LONG AS YOU CALL BEFORE 4:00 PM   At the [facility], you and your health needs are our priority. As part of our continuing mission to provide you with exceptional heart care, we have created designated Provider Care Teams. These Care Teams include your primary Cardiologist (physician) and Advanced Practice Providers (APPs- Physician Assistants and Nurse Practitioners) who all work together to provide you with the care you need, when you need it.   You may see any of the following providers on your designated Care Team at your next follow up: Dr Daniel Bensimhon Dr Dalton McLean Dr. Aditya Sabharwal Amy Clegg, NP Brittainy Simmons, PA Jessica Milford,NP Lindsay Finch,  PA Alma Diaz, NP Lauren Kemp, PharmD   Please be sure to bring in all your medications bottles to every appointment.    Thank you for choosing [facility]    

## 2023-05-26 NOTE — Patient Instructions (Signed)
Description   Called and spoke with pt. Instructed to take 2 tablets today and 2 tablets tomorrow and then continue taking warfarin 1.5 tablets daily EXCEPT 2 tablets on Saturdays.  Recheck INR 6 weeks.  Stay consistent with greens.  Coumadin Clinic for any changes in medications or upcoming procedures. [phone]

## 2023-06-13 ENCOUNTER — Other Ambulatory Visit: Payer: Self-pay | Admitting: Cardiovascular Disease

## 2023-06-13 DIAGNOSIS — I359 Nonrheumatic aortic valve disorder, unspecified: Secondary | ICD-10-CM

## 2023-06-14 NOTE — Telephone Encounter (Signed)
Warfarin 5mg  refill Aortic valve Last INR 05/26/23 Last OV 05/26/23

## 2023-06-22 ENCOUNTER — Encounter: Payer: Self-pay | Admitting: Acute Care

## 2023-06-22 ENCOUNTER — Ambulatory Visit: Payer: Medicare PPO | Admitting: Acute Care

## 2023-06-22 VITALS — BP 128/62 | HR 71 | Temp 97.5°F | Ht 68.0 in | Wt 166.4 lb

## 2023-06-22 DIAGNOSIS — Z923 Personal history of irradiation: Secondary | ICD-10-CM

## 2023-06-22 DIAGNOSIS — Z952 Presence of prosthetic heart valve: Secondary | ICD-10-CM | POA: Diagnosis not present

## 2023-06-22 DIAGNOSIS — Z87891 Personal history of nicotine dependence: Secondary | ICD-10-CM

## 2023-06-22 DIAGNOSIS — Z85118 Personal history of other malignant neoplasm of bronchus and lung: Secondary | ICD-10-CM

## 2023-06-22 DIAGNOSIS — Z9229 Personal history of other drug therapy: Secondary | ICD-10-CM | POA: Diagnosis not present

## 2023-06-22 DIAGNOSIS — R042 Hemoptysis: Secondary | ICD-10-CM

## 2023-06-22 NOTE — Patient Instructions (Addendum)
It is good to see you today. Your CT chest is stable when compared to the previous scan. We will have you follow up with Dr. Tonia Brooms in August after the follow up scan with oncology.  If you notice worsening of hemoptysis, please call us or seek emergency care.  Follow up with Dr. Tonia Brooms after repeat CT chest with oncology September 2024 to review August CT Scan results.  Minimally we will need to do a 12 month follow up to ensure continued stability of the nodules of concern Please contact office for sooner follow up if symptoms do not improve or worsen or seek emergency care

## 2023-06-22 NOTE — Progress Notes (Signed)
History of Present Illness [name] [name] is a 77 y.o. male male former smoker with history of stage I primary bronchogenic carcinoma, treatment with SBRT 02/2021, severe coronary disease, history of AVR on Coumadin, heart failure on Entresto.  He is followed by Dr. Tonia Brooms. He was referred back by Dr. Roselind Messier for hemoptysis and imaging changes  01/2023.   Radiation Treatment Dates: 02/18/2021 through 02/25/2021   [name], with severe coronary disease, history of AVR on Coumadin, heart failure on Entresto.  Underwent previous navigational bronchoscopy for a stage I primary bronchogenic carcinoma.  Had SBRT treatments 02/18/2021 through 02/25/2021 and has done well since then.  He has an area of scarring in his lingula that has remained persistent since radiation treatments but now has a progressive infiltrate in the left lower lobe with intermittent bouts of small hemoptysis.   Since he has had a nonresolving infiltrate in the left lower lobe with associated hemoptysis the concern is underlying DAH. It is possible that this is related to his ongoing anticoagulation with Coumadin.  This makes it very difficult situation as he needs to be anticoagulated due to his AVR.   Dr. Tonia Brooms discussed case with radiation oncology as well as Dr. Myrla Halsted   Dr. Katrinka Blazing was  willing to do bronchoscopy with BAL serial BAL's to rule out DAH. Specimens were sent for culture, respiratory culture, fungus and AFB.   We also  send for cell count analysis with differential.     Dr. Katrinka Blazing did a bronchoscopy with BAL on 03/19/2023 to evaluate source of hemoptysis and to rule out DAH. Per the OP note Right lung was clear, only notable finding was RUL bronchial pitting. The LLL oozing was noted. Serial BAL did not darken . Dr. Katrinka Blazing felt this was more consistent with a small airway bleed rather than alveolitis. He Questioned injury related to nearby SBRT.  He was seen 03/24/2023, CBC stable, given Hycodan  for cough suppression. He was seen by Dr. Delton Coombes 04/21/2023, who had patient scheduled for a follow up as BAL results done 03/19/2023 with bronch .All cultures were negative for bacteria/ fungal infection.  3 month follow up Ct chest done 05/25/2023 shows stable lymphadenopathy and RLL nodule.    06/22/2023  Pt. Presents for follow up after repeat 3 month CT chest. Scan remains stable in regard to lymphadenopathy and nodule size. Pt. Is continuing to have hemoptysis. This is worse in the morning, He coughs in the morning, but he does not cough at any other time throughout the day, so he has no bleeding other than am. He states he has been feeling some chest soreness. He states he has not felt any different, and so stable interval. Last INR was 2.0. [facility] tomorrow afternoon. He has had some elevated readings as high as 3.9 in recent weeks.. Patient  does have a repeat scan in August through oncology that we will review again for any changes. [name]  He denies any fever, chills or shortness of breath. No weight loss.  Dr. Tonia Brooms feels the hemoptysis may be related to his anticoagulation for his valve, however he did not have it immediately after radiation therapy 02/2021.  We will follow up with him after his August scan to reassess.  Test Results: BAL Cultures 03/19/2023 Fungal >> No fungus observed Respiratory Cx>>No anaerobes isolated AFB>> Negative Respiratory Cx >> No Growth   12/08/2020 CT chest: 11 x 16 mm lingular lung nodule concerning  for primary bronchogenic carcinoma with lobulated edges.   12/13/2020 nuclear medicine PET/CT: A 15 mm lingular nodule SUV max 2.3 concerning for primary bronchogenic carcinoma.     March  2024 CT chest: Progressive infiltrate within the left lower lobe seen in serial imaging over the past 8 months.  He has already been treated with antibiotics for pneumonia.  05/2023 CT Chest Unchanged treated irregular lingular mass with  adjacent fiducials. Slightly increased adjacent consolidation and volume loss with retraction of the adjacent fissure, which may represent posttreatment changes. 2. Similar diffuse crazy paving pattern involving the left lower lobe. Findings are nonspecific and may be infectious/inflammatory, such as radiation pneumonitis, or pulmonary hemorrhage given reported hemoptysis. 3. Unchanged 6 mm subpleural right middle lobe nodule. 4. Unchanged mediastinal lymphadenopathy. 5. Aortic Atherosclerosis (ICD10-I70.0) and Emphysema (ICD10-J43.9). Coronary artery calcifications. Assessment for potential risk factor modification, dietary therapy or pharmacologic therapy may be warranted, if clinically indicated.         Latest Ref Rng & Units 03/24/2023    2:21 PM 03/19/2023    6:49 AM 02/25/2023   12:04 PM  CBC  WBC 4.0 - 10.5 K/uL 9.3  8.2  8.1   Hemoglobin 13.0 - 17.0 g/dL 16.1  09.6  04.5   Hematocrit 39.0 - 52.0 % 53.6  51.2  49.2   Platelets 150.0 - 400.0 K/uL 188.0  192  215        Latest Ref Rng & Units 05/26/2023   11:09 AM 03/19/2023    6:49 AM 03/09/2023    1:37 PM  BMP  Glucose 70 - 99 mg/dL 409  811  914   BUN 8 - 23 mg/dL 22  36  27   Creatinine 0.61 - 1.24 mg/dL 7.82  9.56  2.13   Sodium 135 - 145 mmol/L 137  138  138   Potassium 3.5 - 5.1 mmol/L 4.3  4.6  4.4   Chloride 98 - 111 mmol/L 102  100  99   CO2 22 - 32 mmol/L 25  29  29    Calcium 8.9 - 10.3 mg/dL 9.0  9.1  8.9     BNP    Component Value Date/Time   BNP 376.3 (H) 02/25/2023 1204    ProBNP    Component Value Date/Time   PROBNP 3,116 (H) 10/02/2019 1131    PFT No results found for: "FEV1PRE", "FEV1POST", "FVCPRE", "FVCPOST", "TLC", "DLCOUNC", "PREFEV1FVCRT", "PSTFEV1FVCRT"  No results found.   Past medical hx Past Medical History:  Diagnosis Date   Adenomatous colon polyp 09/1997   Anemia    Aortic stenosis    s/p [facility] mechanical AVR - Chronic Coumadin   Blood transfusion    "related to  ITP"   CHF (congestive heart failure) (HCC)    COPD (chronic obstructive pulmonary disease) (HCC)    patient denies this dx on 01/27/21   Coronary artery disease    s/p cabg x 3 11/2003: lima-lad, seq vg to rpda and rpl   Diverticulitis of colon    Dysrhythmia    a-fib   GERD (gastroesophageal reflux disease)    patient states he has never had reflux   Heart murmur    History of radiation therapy 02/18/2021, 02/20/2021, 02/25/2021   SBRT to left lung     Dr Antony Blackbird   Hyperlipidemia    Hypertension    ITP (idiopathic thrombocytopenic purpura)    Lung cancer (HCC)    last week   Peripheral arterial disease (HCC)  a. history of aortobifemoral bypass grafting by Dr. Tawanna Cooler early b. LE angiography 04/22/2015 patent aortobifem graft, DES to R SFA   Peripheral vascular disease (HCC)    s/p Left external Iliac Artery stenting and subsequent left femoral endarterectomy 02/2011- post op course complicated by wound infxn req I&D 03/2011   Renal artery stenosis, native, bilateral (HCC)    a. bilateral renal artery stenosis by recent duplex ultrasound b. L renal artery stent 02/2015, R renal artery patent on angiogram   Stroke [facility]) 2015   TIA (transient ischemic attack) ~ 2013   Type II diabetes mellitus (HCC)      Social History   Tobacco Use   Smoking status: Former    Current packs/day: 0.00    Average packs/day: 1.5 packs/day for 30.0 years (45.0 ttl pk-yrs)    Types: Cigarettes, Cigars    Start date: 12/16/1963    Quit date: 12/15/1993    Years since quitting: 29.5   Smokeless tobacco: Never   Tobacco comments:    occasional cigar  Vaping Use   Vaping status: Never Used  Substance Use Topics   Alcohol use: Yes    Alcohol/week: 15.0 standard drinks of alcohol    Types: 1 Cans of beer, 7 Shots of liquor, 7 Standard drinks or equivalent per week    Comment: drinks 1 martini's a night (2 shots)   Drug use: No    Mr.Evetts reports that he quit smoking about 29 years ago. His  smoking use included cigarettes and cigars. He started smoking about 59 years ago. He has a 45 pack-year smoking history. He has never used smokeless tobacco. He reports current alcohol use of about 15.0 standard drinks of alcohol per week. He reports that he does not use drugs.  Tobacco Cessation: Former smoker , Quit in 1995 with a 45 pack year smoking history    Past surgical hx, Family hx, Social hx all reviewed.  Current Outpatient Medications on File Prior to Visit  Medication Sig   atorvastatin (LIPITOR) 80 MG tablet Take 80 mg by mouth daily.   Blood Glucose Monitoring Suppl (ONETOUCH VERIO IQ SYSTEM) w/Device KIT    carvedilol (COREG) 3.125 MG tablet Take 1 tablet (3.125 mg total) by mouth 2 (two) times daily.   enoxaparin (LOVENOX) 80 MG/0.8ML injection Inject 0.8 mLs (80 mg total) into the skin every 12 (twelve) hours. As instructed by Coumadin Clinic   ENTRESTO 24-26 MG TAKE 1 TABLET BY MOUTH TWICE A DAY   Evolocumab 140 MG/ML SOAJ Inject 140 mg into the skin every 14 (fourteen) days.   furosemide (LASIX) 20 MG tablet 60 mg daily alternating with 40 mg daily. (Patient taking differently: Take 40-60 mg by mouth See admin instructions. 60 mg daily alternating with 40 mg daily.)   glucose blood (ONETOUCH VERIO) test strip    glyBURIDE (DIABETA) 2.5 MG tablet Take 5 mg by mouth 2 (two) times daily with a meal.   HYDROcodone bit-homatropine (HYCODAN) 5-1.5 MG/5ML syrup Take 5 mLs by mouth at bedtime as needed for cough.   JARDIANCE 25 MG TABS tablet Take 25 mg by mouth daily.   nitroGLYCERIN (NITROSTAT) 0.4 MG SL tablet Place 1 tablet (0.4 mg total) under the tongue every 5 (five) minutes x 3 doses as needed for chest pain.   OneTouch Delica Lancets 33G MISC    spironolactone (ALDACTONE) 25 MG tablet TAKE 1 TABLET BY MOUTH EVERY DAY   warfarin (COUMADIN) 5 MG tablet TAKE 1 TO 1 AND 1/2  TABLETS BY MOUTH DAILY AS PRESCRIBED BY THE COUMADIN CLINIC.   No current facility-administered  medications on file prior to visit.     No Known Allergies  Review Of Systems:  Constitutional:   No  weight loss, night sweats,  Fevers, chills, fatigue, or  lassitude.  HEENT:   No headaches,  Difficulty swallowing,  Tooth/dental problems, or  Sore throat,                No sneezing, itching, ear ache, nasal congestion, post nasal drip,   CV:  No chest pain,  Orthopnea, PND, swelling in lower extremities, anasarca, dizziness, palpitations, syncope.   GI  No heartburn, indigestion, abdominal pain, nausea, vomiting, diarrhea, change in bowel habits, loss of appetite, bloody stools.   Resp: No shortness of breath with exertion or at rest.  No excess mucus, no productive cough,  No non-productive cough,  + scant  coughing up of blood in the mornings only.  No change in color of mucus.  No wheezing.  No chest wall deformity  Skin: no rash or lesions.  GU: no dysuria, change in color of urine, no urgency or frequency.  No flank pain, no hematuria   MS:  No joint pain or swelling.  No decreased range of motion.  No back pain.  Psych:  No change in mood or affect. No depression or anxiety.  No memory loss.   Vital Signs BP 128/62 (BP Location: Left Arm, Cuff Size: Normal)   Pulse 71   Temp (!) 97.5 F (36.4 C) (Temporal)   Ht 5\' 8"  (1.727 m)   Wt 166 lb 6.4 oz (75.5 kg)   SpO2 97%   BMI 25.30 kg/m    Physical Exam:  General- No distress,  A&Ox3, pleasant ENT: No sinus tenderness, TM clear, pale nasal mucosa, no oral exudate,no post nasal drip, no LAN Cardiac: S1, S2, regular rate and rhythm, no murmur Chest: No wheeze/ rales/ dullness; no accessory muscle use, no nasal flaring, no sternal retractions Abd.: Soft Non-tender, ND, BS +, Body mass index is 25.3 kg/m.  Ext: No clubbing cyanosis, edema Neuro:  normal strength, MAE x 4, A&O x 3, appropriate Skin: No rashes, warm and dry, no lesions  Psych: normal mood and behavior   Assessment/Plan Stable CT Chest  Imaging History of stage I primary bronchogenic carcinoma, treatment with SBRT 02/2021 Hemoptysis 03/2023  Dr. Katrinka Blazing felt this was more consistent with a small airway bleed rather than alveolitis 2/2  injury related to nearby SBRT.  Plan Your CT chest is stable when compared to the previous scan. If you notice worsening of hemoptysis, please call us or seek emergency care.  Follow up with Dr. Tonia Brooms after repeat CT chest with oncology September 2024 to review August CT Scan results.  Minimally we will need to do a 12 month follow up to ensure continued stability of the nodules of concern Please contact office for sooner follow up if symptoms do not improve or worsen or seek emergency care    I spent 35 minutes dedicated to the care of this patient on the date of this encounter to include pre-visit review of records, face-to-face time with the patient discussing conditions above, post visit ordering of testing, clinical documentation with the electronic health record, making appropriate referrals as documented, and communicating necessary information to the patient's healthcare team.    Bevelyn Ngo, NP 06/22/2023  6:12 PM

## 2023-06-23 ENCOUNTER — Ambulatory Visit: Payer: Medicare PPO | Attending: Cardiology | Admitting: *Deleted

## 2023-06-23 DIAGNOSIS — Z5181 Encounter for therapeutic drug level monitoring: Secondary | ICD-10-CM | POA: Diagnosis not present

## 2023-06-23 DIAGNOSIS — I359 Nonrheumatic aortic valve disorder, unspecified: Secondary | ICD-10-CM

## 2023-06-23 DIAGNOSIS — G459 Transient cerebral ischemic attack, unspecified: Secondary | ICD-10-CM | POA: Diagnosis not present

## 2023-06-23 LAB — POCT INR: INR: 2.6 (ref 2.0–3.0)

## 2023-06-23 NOTE — Patient Instructions (Signed)
Description   Continue taking  warfarin 1.5 tablets daily EXCEPT 2 tablets on Saturdays. Recheck INR 6 weeks. Stay consistent with greens. Coumadin Clinic for any changes in medications or upcoming procedures. [phone]      

## 2023-07-12 DIAGNOSIS — Z85828 Personal history of other malignant neoplasm of skin: Secondary | ICD-10-CM | POA: Diagnosis not present

## 2023-07-12 DIAGNOSIS — D0421 Carcinoma in situ of skin of right ear and external auricular canal: Secondary | ICD-10-CM | POA: Diagnosis not present

## 2023-07-12 DIAGNOSIS — L570 Actinic keratosis: Secondary | ICD-10-CM | POA: Diagnosis not present

## 2023-07-12 DIAGNOSIS — D485 Neoplasm of uncertain behavior of skin: Secondary | ICD-10-CM | POA: Diagnosis not present

## 2023-07-12 DIAGNOSIS — L578 Other skin changes due to chronic exposure to nonionizing radiation: Secondary | ICD-10-CM | POA: Diagnosis not present

## 2023-07-12 DIAGNOSIS — C44722 Squamous cell carcinoma of skin of right lower limb, including hip: Secondary | ICD-10-CM | POA: Diagnosis not present

## 2023-07-13 ENCOUNTER — Telehealth: Payer: Self-pay | Admitting: *Deleted

## 2023-07-13 NOTE — Telephone Encounter (Signed)
CALLED PATIENT TO INFORM OF CT FOR 07-16-23- ARRIVAL TIME- 11:45 AM @ [facility], NO RESTRICTIONS TO SCAN, PATIENT TO RECEIVE CT RESULTS FROM DR. KINARD ON 07-22-23 @ 11AM, LVM FOR A RETURN CALL

## 2023-07-15 ENCOUNTER — Encounter: Payer: Self-pay | Admitting: Podiatry

## 2023-07-15 ENCOUNTER — Ambulatory Visit (INDEPENDENT_AMBULATORY_CARE_PROVIDER_SITE_OTHER): Payer: Medicare PPO | Admitting: Podiatry

## 2023-07-15 DIAGNOSIS — C3492 Malignant neoplasm of unspecified part of left bronchus or lung: Secondary | ICD-10-CM | POA: Diagnosis not present

## 2023-07-15 DIAGNOSIS — I6990 Unspecified sequelae of unspecified cerebrovascular disease: Secondary | ICD-10-CM | POA: Diagnosis not present

## 2023-07-15 DIAGNOSIS — J449 Chronic obstructive pulmonary disease, unspecified: Secondary | ICD-10-CM | POA: Diagnosis not present

## 2023-07-15 DIAGNOSIS — I739 Peripheral vascular disease, unspecified: Secondary | ICD-10-CM

## 2023-07-15 DIAGNOSIS — D689 Coagulation defect, unspecified: Secondary | ICD-10-CM | POA: Diagnosis not present

## 2023-07-15 DIAGNOSIS — G459 Transient cerebral ischemic attack, unspecified: Secondary | ICD-10-CM | POA: Diagnosis not present

## 2023-07-15 DIAGNOSIS — B351 Tinea unguium: Secondary | ICD-10-CM | POA: Diagnosis not present

## 2023-07-15 DIAGNOSIS — N1831 Chronic kidney disease, stage 3a: Secondary | ICD-10-CM | POA: Diagnosis not present

## 2023-07-15 DIAGNOSIS — I4819 Other persistent atrial fibrillation: Secondary | ICD-10-CM | POA: Diagnosis not present

## 2023-07-15 DIAGNOSIS — E1122 Type 2 diabetes mellitus with diabetic chronic kidney disease: Secondary | ICD-10-CM | POA: Diagnosis not present

## 2023-07-15 DIAGNOSIS — Z952 Presence of prosthetic heart valve: Secondary | ICD-10-CM | POA: Diagnosis not present

## 2023-07-15 DIAGNOSIS — Z7901 Long term (current) use of anticoagulants: Secondary | ICD-10-CM | POA: Diagnosis not present

## 2023-07-15 NOTE — Progress Notes (Signed)
This patient returns to my office for at risk foot care.  This patient requires this care by a professional since this patient will be at risk due to having diabetes , chronic anticoagulation renal disease and PVD.  Patient is taking coumadin.  This patient is unable to cut nails himself since the patient cannot reach his nails.These nails are painful walking and wearing shoes.  This patient presents for at risk foot care today.  General Appearance  Alert, conversant and in no acute stress.  Vascular  Dorsalis pedis and posterior tibial  pulses are  weakly palpable  bilaterally.  Capillary return is within normal limits  bilaterally. Temperature is within normal limits  bilaterally.  Neurologic  Senn-Weinstein monofilament wire test within normal limits  bilaterally. Muscle power within normal limits bilaterally.  Nails Thick disfigured discolored nails with subungual debris  from hallux to fifth toes bilaterally. No evidence of bacterial infection or drainage bilaterally.  Orthopedic  No limitations of motion  feet .  No crepitus or effusions noted.  No bony pathology or digital deformities noted.  Skin  normotropic skin with no porokeratosis noted bilaterally.  No signs of infections or ulcers noted.     Onychomycosis  Pain in right toes  Pain in left toes  Consent was obtained for treatment procedures.   Mechanical debridement of nails 1-5  bilaterally performed with a nail nipper.  Filed with dremel without incident.    Return office visit    4 months                  Told patient to return for periodic foot care and evaluation due to potential at risk complications.   Helane Gunther DPM

## 2023-07-16 ENCOUNTER — Other Ambulatory Visit (HOSPITAL_COMMUNITY): Payer: Medicare PPO

## 2023-07-18 ENCOUNTER — Ambulatory Visit (HOSPITAL_BASED_OUTPATIENT_CLINIC_OR_DEPARTMENT_OTHER)
Admission: RE | Admit: 2023-07-18 | Discharge: 2023-07-18 | Disposition: A | Discharge status: Home / Self Care | Payer: Medicare PPO | Source: Ambulatory Visit | Attending: Radiation Oncology | Admitting: Radiation Oncology

## 2023-07-18 DIAGNOSIS — I700 Atherosclerosis of aorta: Secondary | ICD-10-CM | POA: Diagnosis not present

## 2023-07-18 DIAGNOSIS — J8410 Pulmonary fibrosis, unspecified: Secondary | ICD-10-CM | POA: Diagnosis not present

## 2023-07-18 DIAGNOSIS — J984 Other disorders of lung: Secondary | ICD-10-CM | POA: Diagnosis not present

## 2023-07-18 DIAGNOSIS — C3491 Malignant neoplasm of unspecified part of right bronchus or lung: Secondary | ICD-10-CM | POA: Diagnosis not present

## 2023-07-18 DIAGNOSIS — C3412 Malignant neoplasm of upper lobe, left bronchus or lung: Secondary | ICD-10-CM | POA: Insufficient documentation

## 2023-07-21 NOTE — Progress Notes (Signed)
Radiation Oncology         [phone] ________________________________  Name: [name] [name] MRN: [id number]  Date: 07/22/2023  DOB: 1946/08/23  Follow-Up Visit Note  CC: Creola Corn, MD  Josephine Igo, DO  No diagnosis found.  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 He was last seen here for follow-up on 01/18/23. He was unable to present for his last scheduled follow-up visit in late march.   Since he was last seen, the patient presented for a follow-up chest CT on 03/02/23 which showed: no change in the treated mass in the lingula; relatively stable extensive interlobular septal thickening and ground-glass airspace opacity throughout the left lung base; additional indeterminate scattered ground-glass and septal thickening in the right lung base; and no interval change in the subpleural nodule of the anterior right middle lobe measuring 0.5 cm.        In April, the patient developed hemoptysis. Given CT findings showing no change in the treated lingula mass, and his onset of hemoptysis, Dr. Tonia Brooms recommended proceeding with bronchoscopy with bronchial alveolar lavage. This was performed on 03/19/23 and showed no malignant cells from lavage of the LLL. Procedural findings were unremarkable other than RUL bronchial pitting an LLL oozing. Per pulmonology, these findings were felt to be more consistent with a small airway bleed rather than alveolitis . Respiratory cultures were also obtained and came back negative.   His hemoptysis persisted and  consisted only dark red blood. He was initially treated with abx and given cough medications. A repeat chest CT was subsequently performed on 05/20/23 which showed a slight increase in  consolidation and volume loss with retraction of the fissure adjacent to the unchanged treated lingular mass. The diffuse paving pattern involving the left lower lobe also appeared unchanged (indeterminate for radiation pneumonitis vs pulmonary hemorrhage given the patients hemoptysis. CT also showed no change in the 6 mm right middle lobe nodule, and stable appearing mediastinal lymphadenopathy.    The patient's hemoptysis continues to persist but remains stable in quantity and color (dark red). Pulmonology will continue to follow this.   His most recent chest CT on 07/18/23 demonstrates: ***.     Allergies:  has No Known Allergies.  Meds: Current Outpatient Medications  Medication Sig Dispense Refill   atorvastatin (LIPITOR) 80 MG tablet Take 80 mg by mouth daily.     Blood Glucose Monitoring Suppl (ONETOUCH VERIO IQ SYSTEM) w/Device KIT      carvedilol (COREG) 3.125 MG tablet Take 1 tablet (3.125 mg total) by mouth 2 (two) times daily. 180 tablet 3   enoxaparin (LOVENOX) 80 MG/0.8ML injection Inject 0.8 mLs (80 mg total) into the skin every 12 (twelve) hours. As instructed by Coumadin Clinic 8 mL 0   ENTRESTO 24-26 MG TAKE 1 TABLET BY MOUTH TWICE A DAY 60 tablet 6   Evolocumab 140 MG/ML SOAJ Inject 140 mg into the skin every 14 (fourteen) days.     furosemide (LASIX) 20 MG tablet 60 mg daily alternating with 40 mg daily. (Patient taking differently: Take 40-60 mg by mouth See admin  instructions. 60 mg daily alternating with 40 mg daily.) 180 tablet 3   glucose blood (ONETOUCH VERIO) test strip      glyBURIDE (DIABETA) 2.5 MG tablet Take 5 mg by mouth 2 (two) times daily with a meal.     HYDROcodone bit-homatropine (HYCODAN) 5-1.5 MG/5ML syrup Take 5 mLs by mouth at bedtime as needed for cough. 240 mL 0    JARDIANCE 25 MG TABS tablet Take 25 mg by mouth daily.     nitroGLYCERIN (NITROSTAT) 0.4 MG SL tablet Place 1 tablet (0.4 mg total) under the tongue every 5 (five) minutes x 3 doses as needed for chest pain. 25 tablet 12   OneTouch Delica Lancets 33G MISC      spironolactone (ALDACTONE) 25 MG tablet TAKE 1 TABLET BY MOUTH EVERY DAY 90 tablet 3   warfarin (COUMADIN) 5 MG tablet TAKE 1 TO 1 AND 1/2 TABLETS BY MOUTH DAILY AS PRESCRIBED BY THE COUMADIN CLINIC. 135 tablet 1   No current facility-administered medications for this encounter.    Physical Findings: The patient is in no acute distress. Patient is alert and oriented.  vitals were not taken for this visit. .  No significant changes. Lungs are clear to auscultation bilaterally. Heart has regular rate and rhythm. No palpable cervical, supraclavicular, or axillary adenopathy. Abdomen soft, non-tender, normal bowel sounds.   Lab Findings: Lab Results  Component Value Date   WBC 9.3 03/24/2023   HGB 17.7 (H) 03/24/2023   HCT 53.6 (H) 03/24/2023   MCV 93.3 03/24/2023   PLT 188.0 03/24/2023    Radiographic Findings: No results found.  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  ***  Plan:  ***   *** minutes of total time was spent for this patient encounter, including preparation, face-to-face counseling with the patient and coordination of care, physical exam, and documentation of the encounter. ____________________________________  Billie Lade, PhD, MD  This document serves as a record of services personally performed by Antony Blackbird, MD. It was created on his behalf by Neena Rhymes, a trained medical scribe. The creation of this record is based on the scribe's personal observations and the provider's statements to them. This document has been checked and approved by the attending provider.

## 2023-07-22 ENCOUNTER — Other Ambulatory Visit: Payer: Self-pay

## 2023-07-22 ENCOUNTER — Encounter: Payer: Self-pay | Admitting: Radiation Oncology

## 2023-07-22 ENCOUNTER — Ambulatory Visit
Admission: RE | Admit: 2023-07-22 | Discharge: 2023-07-22 | Disposition: A | Discharge status: Home / Self Care | Payer: Medicare PPO | Source: Ambulatory Visit | Attending: Radiation Oncology | Admitting: Radiation Oncology

## 2023-07-22 VITALS — BP 170/54 | HR 70 | Temp 97.7°F | Resp 18 | Ht 68.0 in | Wt 173.4 lb

## 2023-07-22 DIAGNOSIS — Z7984 Long term (current) use of oral hypoglycemic drugs: Secondary | ICD-10-CM | POA: Insufficient documentation

## 2023-07-22 DIAGNOSIS — C3412 Malignant neoplasm of upper lobe, left bronchus or lung: Secondary | ICD-10-CM | POA: Diagnosis not present

## 2023-07-22 DIAGNOSIS — C3410 Malignant neoplasm of upper lobe, unspecified bronchus or lung: Secondary | ICD-10-CM

## 2023-07-22 DIAGNOSIS — C3492 Malignant neoplasm of unspecified part of left bronchus or lung: Secondary | ICD-10-CM

## 2023-07-22 DIAGNOSIS — I700 Atherosclerosis of aorta: Secondary | ICD-10-CM | POA: Diagnosis not present

## 2023-07-22 DIAGNOSIS — Z79899 Other long term (current) drug therapy: Secondary | ICD-10-CM | POA: Diagnosis not present

## 2023-07-22 DIAGNOSIS — Z923 Personal history of irradiation: Secondary | ICD-10-CM | POA: Insufficient documentation

## 2023-07-22 NOTE — Progress Notes (Signed)
[name] [name] is here today for follow up post radiation to the lung.  Lung Side: Left, patient completed treatment on 02/25/21  Does the patient complain of any of the following: Pain: No Shortness of breath w/wo exertion:  Yes,mostly on exertion.  Cough: Yes productive Hemoptysis: Yes, dark specks of blood in sputum at times.  Pain with swallowing: No Swallowing/choking concerns: No Appetite: Good Weight: Wt Readings from Last 3 Encounters:  07/22/23 173 lb 6 oz (78.6 kg)  06/22/23 166 lb 6.4 oz (75.5 kg)  05/26/23 171 lb (77.6 kg)      Energy Level: good Post radiation skin Changes: No    Additional comments if applicable:   BP (!) 170/54 (BP Location: Left Arm, Patient Position: Sitting)   Pulse 70   Temp 97.7 F (36.5 C) (Temporal)   Resp 18   Ht 5\' 8"  (1.727 m)   Wt 173 lb 6 oz (78.6 kg)   SpO2 94%   BMI 26.36 kg/m

## 2023-08-04 ENCOUNTER — Ambulatory Visit: Payer: Medicare PPO | Attending: Cardiology | Admitting: *Deleted

## 2023-08-04 DIAGNOSIS — Z5181 Encounter for therapeutic drug level monitoring: Secondary | ICD-10-CM

## 2023-08-04 DIAGNOSIS — G459 Transient cerebral ischemic attack, unspecified: Secondary | ICD-10-CM | POA: Diagnosis not present

## 2023-08-04 DIAGNOSIS — Z85828 Personal history of other malignant neoplasm of skin: Secondary | ICD-10-CM | POA: Diagnosis not present

## 2023-08-04 DIAGNOSIS — I359 Nonrheumatic aortic valve disorder, unspecified: Secondary | ICD-10-CM

## 2023-08-04 DIAGNOSIS — D0421 Carcinoma in situ of skin of right ear and external auricular canal: Secondary | ICD-10-CM | POA: Diagnosis not present

## 2023-08-04 LAB — POCT INR: INR: 3.1 — AB (ref 2.0–3.0)

## 2023-08-04 NOTE — Patient Instructions (Signed)
Description   Continue taking  warfarin 1.5 tablets daily EXCEPT 2 tablets on Saturdays. Recheck INR 6 weeks. Stay consistent with greens. Coumadin Clinic for any changes in medications or upcoming procedures. [phone]      

## 2023-08-13 ENCOUNTER — Other Ambulatory Visit (HOSPITAL_COMMUNITY): Payer: Self-pay | Admitting: Cardiology

## 2023-08-18 ENCOUNTER — Other Ambulatory Visit (HOSPITAL_COMMUNITY): Payer: Self-pay | Admitting: Cardiology

## 2023-08-30 ENCOUNTER — Encounter: Payer: Self-pay | Admitting: Pulmonary Disease

## 2023-08-30 ENCOUNTER — Ambulatory Visit: Payer: Medicare PPO | Admitting: Pulmonary Disease

## 2023-08-30 VITALS — BP 138/62 | HR 71 | Ht 68.0 in | Wt 171.0 lb

## 2023-08-30 DIAGNOSIS — Z87891 Personal history of nicotine dependence: Secondary | ICD-10-CM

## 2023-08-30 DIAGNOSIS — G589 Mononeuropathy, unspecified: Secondary | ICD-10-CM | POA: Insufficient documentation

## 2023-08-30 DIAGNOSIS — R4189 Other symptoms and signs involving cognitive functions and awareness: Secondary | ICD-10-CM | POA: Insufficient documentation

## 2023-08-30 DIAGNOSIS — Z952 Presence of prosthetic heart valve: Secondary | ICD-10-CM | POA: Diagnosis not present

## 2023-08-30 DIAGNOSIS — C3492 Malignant neoplasm of unspecified part of left bronchus or lung: Secondary | ICD-10-CM

## 2023-08-30 DIAGNOSIS — D6869 Other thrombophilia: Secondary | ICD-10-CM | POA: Insufficient documentation

## 2023-08-30 DIAGNOSIS — R911 Solitary pulmonary nodule: Secondary | ICD-10-CM

## 2023-08-30 DIAGNOSIS — R2689 Other abnormalities of gait and mobility: Secondary | ICD-10-CM | POA: Insufficient documentation

## 2023-08-30 DIAGNOSIS — R200 Anesthesia of skin: Secondary | ICD-10-CM | POA: Insufficient documentation

## 2023-08-30 NOTE — Patient Instructions (Addendum)
Thank you for visiting Dr. Tonia Brooms at [facility]. Today we recommend the following:  Return in about 1 year (around 08/29/2024) for with APP or Dr. Tonia Brooms.    Please do your part to reduce the spread of COVID-19.

## 2023-08-30 NOTE — Progress Notes (Signed)
Synopsis: Referred in January 2021 for PET avid lung nodule by Creola Corn, MD  Subjective:   PATIENT ID: [name] [name] GENDER: male DOB: [birth year], MRN: [id number]  Chief Complaint  Patient presents with   Nodule of upper lobe of left lung    [name] past medical history of aortic stenosis, mechanical Saint Jude AVR on Coumadin, heart failure, COPD, history of coronary artery disease status post CABG, ITP, peripheral arterial disease, TIA, type 2 diabetes.Patient seen today in the office to discuss CT scan results.  Initial CT was completed at the beginning of January which revealed a 1.5 cm lingular nodule.  Patient had a PET scan completed which revealed an SUV max of 2.311 x 16 mm lingular nodule concerning for primary bronchogenic carcinoma.  Patient has a significant cardiac history.  Last week was taken to endoscopy for cardioversion.  By Dr. Shirlee Latch.  Currently anticoagulated with Coumadin.  History of AVR.  He has no complaints today from a respiratory standpoint.  Former smoker.  Denies hemoptysis weight loss fatigue.  OV 02/19/2021: Here today for follow-up after bronchoscopy.  Cytology from bronchoscopy was positive for a squamous cell carcinoma.  Patient was referred to radiation oncology.  Met with Dr. Mauricio Po on 02/06/2021.  Office notes reviewed today.  Patient was planned for SBRT for stage Ia 2 lung cancer.  Patient had his first treatment yesterday.  He did well with this.  Plan for 3 treatments of SBRT with follow-up.  From a respiratory standpoint patient is doing well with no complaints today.  Denies fevers chills night sweats weight loss and hemoptysis.  OV 09/30/2022: Patient here today for follow-up.  He is currently managed by radiation oncology.  He has a serial images completed there.  He is not on any inhalers or medications at this time for breathing.  He has no respiratory complaints today.  This is a 1 year return follow-up.  OV 14 2024:  Here today for follow-up after CT imaging.  He has had a persistent abnormal CT scan of the chest that has progressed over the past 8 months with infiltrate in the left lower lobe.  He now has ongoing hemoptysis.  He is on Coumadin for a mechanical Saint Jude valve replacement.  He had SBRT treatments to a lingular nodule 2 years ago his stable CT imaging from that standpoint.  We progression has led to cough and some shortness of breath associated with this he has areas of groundglass and centrilobular septal thickening.  OV 08/30/2023: He had an episode of bronchitis and hemoptysis.  He is anticoagulated on Coumadin for his prosthetic valve.Patient was last seen in the office by Jairo Ben, NP.  Also had an appointment in May 2024 with Dr. Delton Coombes treated for abnormal chest x-ray with infiltrate.  Bronchoscopy was not definitive for [facility] but he had cultures and everything sent.  Still had cough with hemoptysis dating into June.  Otherwise this started to resolve on its own.  I expect that he had some bleeding at that time.  Follow-up CT imaging that was completed in August 2024 with radiation oncology shows stable posttreatment changes within the lingula and evidence of fibrosis stable interlobular septal thickening.  No other evidence or concern for region for bleeding.      Past Medical History:  Diagnosis Date   Adenomatous colon polyp 09/1997   Anemia    Aortic stenosis    s/p st. jude mechanical AVR - Chronic Coumadin  Blood transfusion    "related to ITP"   CHF (congestive heart failure) (HCC)    COPD (chronic obstructive pulmonary disease) (HCC)    patient denies this dx on 01/27/21   Coronary artery disease    s/p cabg x 3 11/2003: lima-lad, seq vg to rpda and rpl   Diverticulitis of colon    Dysrhythmia    a-fib   GERD (gastroesophageal reflux disease)    patient states he has never had reflux   Heart murmur    History of radiation therapy 02/18/2021, 02/20/2021, 02/25/2021   SBRT to  left lung     Dr Antony Blackbird   Hyperlipidemia    Hypertension    ITP (idiopathic thrombocytopenic purpura)    Lung cancer (HCC)    last week   Peripheral arterial disease (HCC)    a. history of aortobifemoral bypass grafting by Dr. Tawanna Cooler early b. LE angiography 04/22/2015 patent aortobifem graft, DES to R SFA   Peripheral vascular disease (HCC)    s/p Left external Iliac Artery stenting and subsequent left femoral endarterectomy 02/2011- post op course complicated by wound infxn req I&D 03/2011   Renal artery stenosis, native, bilateral (HCC)    a. bilateral renal artery stenosis by recent duplex ultrasound b. L renal artery stent 02/2015, R renal artery patent on angiogram   Stroke (HCC) 2015   TIA (transient ischemic attack) ~ 2013   Type II diabetes mellitus (HCC)      Family History  Problem Relation Age of Onset   Coronary artery disease Mother        bypass surgery - deceased   Heart disease Father        murmur, valve replacement - deceased   Breast cancer Sister    Diabetes Other        grandmother   Diabetes Paternal Grandmother    Diabetes Paternal Aunt    Colon cancer Neg Hx    Colon polyps Neg Hx    Esophageal cancer Neg Hx    Rectal cancer Neg Hx    Stomach cancer Neg Hx      Past Surgical History:  Procedure Laterality Date   ABDOMINAL AORTAGRAM N/A 12/16/2011   Procedure: ABDOMINAL Ronny Flurry;  Surgeon: Tonny Bollman, MD;  Location: [facility] CATH LAB;  Service: Cardiovascular;  Laterality: N/A;   ABDOMINAL AORTOGRAM W/LOWER EXTREMITY Right 02/29/2020   Procedure: ABDOMINAL AORTOGRAM W/LOWER EXTREMITY;  Surgeon: Runell Gess, MD;  Location: [facility] INVASIVE CV LAB;  Service: Cardiovascular;  Laterality: Right;   ANGIOPLASTY / STENTING ILIAC     Left external Iliac Artery   AORTA - BILATERAL FEMORAL ARTERY BYPASS GRAFT  01/18/2012   Procedure: AORTA BIFEMORAL BYPASS GRAFT;  Surgeon: Gretta Began, MD;  Location[facility];  Service: Vascular;  Laterality: N/A;   AORTIC VALVE  REPLACEMENT  ~ 2004   ATRIAL FIBRILLATION ABLATION N/A 11/20/2020   Procedure: ATRIAL FIBRILLATION ABLATION;  Surgeon: Regan Lemming, MD;  Location: [facility] INVASIVE CV LAB;  Service: Cardiovascular;  Laterality: N/A;   BRONCHIAL BIOPSY  01/28/2021   Procedure: BRONCHIAL BIOPSIES;  Surgeon: Josephine Igo, DO;  Location: [facility] ENDOSCOPY;  Service: Pulmonary;;   BRONCHIAL BRUSHINGS  01/28/2021   Procedure: BRONCHIAL BRUSHINGS;  Surgeon: Josephine Igo, DO;  Location: [facility] ENDOSCOPY;  Service: Pulmonary;;   BRONCHIAL NEEDLE ASPIRATION BIOPSY  01/28/2021   Procedure: BRONCHIAL NEEDLE ASPIRATION BIOPSIES;  Surgeon: Josephine Igo, DO;  Location: [facility] ENDOSCOPY;  Service: Pulmonary;;   BRONCHIAL WASHINGS  01/28/2021  Procedure: BRONCHIAL WASHINGS;  Surgeon: Josephine Igo, DO;  Location: [facility] ENDOSCOPY;  Service: Pulmonary;;   BRONCHIAL WASHINGS  03/19/2023   Procedure: BRONCHIAL WASHINGS;  Surgeon: Lorin Glass, MD;  Location: [facility] ENDOSCOPY;  Service: Pulmonary;;   CARDIAC CATHETERIZATION  11/2003   /pt report 10/01/2016   CARDIAC VALVE REPLACEMENT  11/2003   aortic   CARDIOVERSION N/A 10/16/2019   Procedure: CARDIOVERSION;  Surgeon: Laurey Morale, MD;  Location: [facility] ENDOSCOPY;  Service: Cardiovascular;  Laterality: N/A;   CARDIOVERSION N/A 01/01/2021   Procedure: CARDIOVERSION;  Surgeon: Laurey Morale, MD;  Location: [facility] ENDOSCOPY;  Service: Cardiovascular;  Laterality: N/A;   CATARACT EXTRACTION W/ INTRAOCULAR LENS  IMPLANT, BILATERAL Bilateral    COLONOSCOPY     COLONOSCOPY WITH PROPOFOL N/A 09/03/2019   Procedure: COLONOSCOPY WITH PROPOFOL;  Surgeon: Sherrilyn Rist, MD;  Location: [facility] ENDOSCOPY;  Service: Gastroenterology;  Laterality: N/A;   CORONARY ARTERY BYPASS GRAFT  11/2003   Hattie Perch 04/21/2011   CORONARY ATHERECTOMY N/A 10/10/2019   Procedure: CORONARY ATHERECTOMY;  Surgeon: Tonny Bollman, MD;  Location: [facility] INVASIVE CV LAB;  Service: Cardiovascular;  Laterality: N/A;   CORONARY  STENT INTERVENTION N/A 10/10/2019   Procedure: CORONARY STENT INTERVENTION;  Surgeon: Tonny Bollman, MD;  Location: [facility] INVASIVE CV LAB;  Service: Cardiovascular;  Laterality: N/A;   CORONARY/GRAFT ANGIOGRAPHY N/A 09/18/2019   Procedure: CORONARY/GRAFT ANGIOGRAPHY;  Surgeon: Runell Gess, MD;  Location: [facility] INVASIVE CV LAB;  Service: Cardiovascular;  Laterality: N/A;   ESOPHAGOGASTRODUODENOSCOPY (EGD) WITH PROPOFOL N/A 02/27/2019   Procedure: ESOPHAGOGASTRODUODENOSCOPY (EGD) WITH PROPOFOL;  Surgeon: Sherrilyn Rist, MD;  Location: [facility] ENDOSCOPY;  Service: Endoscopy;  Laterality: N/A;   FIDUCIAL MARKER PLACEMENT  01/28/2021   Procedure: FIDUCIAL MARKER PLACEMENT;  Surgeon: Josephine Igo, DO;  Location: [facility] ENDOSCOPY;  Service: Pulmonary;;   HEMOSTASIS CLIP PLACEMENT  09/03/2019   Procedure: HEMOSTASIS CLIP PLACEMENT;  Surgeon: Sherrilyn Rist, MD;  Location: [facility] ENDOSCOPY;  Service: Gastroenterology;;   LOWER EXTREMITY ANGIOGRAM N/A 02/21/2015   Procedure: LOWER EXTREMITY ANGIOGRAM;  Surgeon: Runell Gess, MD;  Location: [facility] CATH LAB;  Service: Cardiovascular;  Laterality: N/A;   PERIPHERAL VASCULAR CATHETERIZATION N/A 04/22/2015   Procedure: Lower Extremity Angiography;  Surgeon: Runell Gess, MD;  Location: [facility] INVASIVE CV LAB;  Service: Cardiovascular;  Laterality: N/A;   PERIPHERAL VASCULAR CATHETERIZATION N/A 08/24/2016   Procedure: Lower Extremity Angiography;  Surgeon: Runell Gess, MD;  Location: [facility] INVASIVE CV LAB;  Service: Cardiovascular;  Laterality: N/A;   PERIPHERAL VASCULAR CATHETERIZATION Right 10/01/2016   Procedure: Peripheral Vascular Intervention - STENT;  Surgeon: Runell Gess, MD;  Location: [facility] INVASIVE CV LAB;  Service: Cardiovascular;  Laterality: Right;  Prox and MID SFA    PERIPHERAL VASCULAR INTERVENTION  02/29/2020   Procedure: PERIPHERAL VASCULAR INTERVENTION;  Surgeon: Runell Gess, MD;  Location: [facility] INVASIVE CV LAB;  Service: Cardiovascular;;   Right SFA   POLYPECTOMY     RENAL ANGIOGRAM N/A 02/21/2015   Procedure: RENAL ANGIOGRAM;  Surgeon: Runell Gess, MD;  Location: [name] CATH LAB;  Service: Cardiovascular;  Laterality: Bilateral; 6 mm x 12 mm long Herculink balloon expandable stent to the left renal artery   RENAL ARTERY STENT Left 04/22/2015   dr berry   SPLENECTOMY  02/2003   Hattie Perch 04/21/2011   TEE WITHOUT CARDIOVERSION N/A 10/16/2019   Procedure: TRANSESOPHAGEAL ECHOCARDIOGRAM (TEE);  Surgeon: Laurey Morale, MD;  Location: [facility] ENDOSCOPY;  Service: Cardiovascular;  Laterality: N/A;   TEMPORARY PACEMAKER N/A 10/10/2019   Procedure: TEMPORARY PACEMAKER;  Surgeon: Tonny Bollman, MD;  Location: [facility] INVASIVE CV LAB;  Service: Cardiovascular;  Laterality: N/A;   TONSILLECTOMY  ~ 1952   VIDEO BRONCHOSCOPY Left 03/19/2023   Procedure: VIDEO BRONCHOSCOPY WITHOUT FLUORO;  Surgeon: Lorin Glass, MD;  Location: [facility] ENDOSCOPY;  Service: Pulmonary;  Laterality: Left;   VIDEO BRONCHOSCOPY WITH ENDOBRONCHIAL NAVIGATION N/A 01/28/2021   Procedure: VIDEO BRONCHOSCOPY WITH ENDOBRONCHIAL NAVIGATION;  Surgeon: Josephine Igo, DO;  Location: [facility] ENDOSCOPY;  Service: Pulmonary;  Laterality: N/A;    Social History   Socioeconomic History   Marital status: Divorced    Spouse name: Not on file   Number of children: 3   Years of education: Not on file   Highest education level: Not on file  Occupational History   Occupation: Retired  Tobacco Use   Smoking status: Former    Current packs/day: 0.00    Average packs/day: 1.5 packs/day for 30.0 years (45.0 ttl pk-yrs)    Types: Cigarettes, Cigars    Start date: 12/16/1963    Quit date: 12/15/1993    Years since quitting: 29.7   Smokeless tobacco: Never   Tobacco comments:    occasional cigar  Vaping Use   Vaping status: Never Used  Substance and Sexual Activity   Alcohol use: Yes    Alcohol/week: 15.0 standard drinks of alcohol    Types: 1 Cans of beer, 7 Shots of liquor, 7 Standard  drinks or equivalent per week    Comment: drinks 1 martini's a night (2 shots)   Drug use: No   Sexual activity: Yes  Other Topics Concern   Not on file  Social History Narrative   Tries to remain active.  Frequent golfer but claudication limits this.   1 child has passed   Social Determinants of Corporate investment banker Strain: Not on file  Food Insecurity: Not on file  Transportation Needs: Not on file  Physical Activity: Not on file  Stress: Not on file  Social Connections: Not on file  Intimate Partner Violence: Not on file     No Known Allergies   Outpatient Medications Prior to Visit  Medication Sig Dispense Refill   atorvastatin (LIPITOR) 80 MG tablet Take 80 mg by mouth daily.     Blood Glucose Monitoring Suppl (ONETOUCH VERIO IQ SYSTEM) w/Device KIT      carvedilol (COREG) 3.125 MG tablet Take 1 tablet (3.125 mg total) by mouth 2 (two) times daily. 180 tablet 3   enoxaparin (LOVENOX) 80 MG/0.8ML injection Inject 0.8 mLs (80 mg total) into the skin every 12 (twelve) hours. As instructed by Coumadin Clinic 8 mL 0   ENTRESTO 24-26 MG TAKE 1 TABLET BY MOUTH TWICE A DAY 60 tablet 6   Evolocumab 140 MG/ML SOAJ Inject 140 mg into the skin every 14 (fourteen) days.     furosemide (LASIX) 20 MG tablet TAKE 2 TABLETS (40 MG TOTAL) BY MOUTH DAILY. 180 tablet 3   glucose blood (ONETOUCH VERIO) test strip      glyBURIDE (DIABETA) 2.5 MG tablet Take 5 mg by mouth 2 (two) times daily with a meal.     JARDIANCE 25 MG TABS tablet Take 25 mg by mouth daily.     nitroGLYCERIN (NITROSTAT) 0.4 MG SL tablet Place 1 tablet (0.4 mg total) under the tongue every 5 (five) minutes x 3 doses as needed for chest pain. 25 tablet 12   OneTouch  Delica Lancets 33G MISC      spironolactone (ALDACTONE) 25 MG tablet TAKE 1 TABLET BY MOUTH EVERY DAY 90 tablet 3   warfarin (COUMADIN) 5 MG tablet TAKE 1 TO 1 AND 1/2 TABLETS BY MOUTH DAILY AS PRESCRIBED BY THE COUMADIN CLINIC. 135 tablet 1   HYDROcodone  bit-homatropine (HYCODAN) 5-1.5 MG/5ML syrup Take 5 mLs by mouth at bedtime as needed for cough. 240 mL 0   No facility-administered medications prior to visit.    Review of Systems  Constitutional:  Negative for chills, fever, malaise/fatigue and weight loss.  HENT:  Negative for hearing loss, sore throat and tinnitus.   Eyes:  Negative for blurred vision and double vision.  Respiratory:  Positive for cough. Negative for hemoptysis, sputum production, shortness of breath, wheezing and stridor.   Cardiovascular:  Negative for chest pain, palpitations, orthopnea, leg swelling and PND.  Gastrointestinal:  Negative for abdominal pain, constipation, diarrhea, heartburn, nausea and vomiting.  Genitourinary:  Negative for dysuria, hematuria and urgency.  Musculoskeletal:  Negative for joint pain and myalgias.  Skin:  Negative for itching and rash.  Neurological:  Negative for dizziness, tingling, weakness and headaches.  Endo/Heme/Allergies:  Negative for environmental allergies. Does not bruise/bleed easily.  Psychiatric/Behavioral:  Negative for depression. The patient is not nervous/anxious and does not have insomnia.   All other systems reviewed and are negative.    Objective:  Physical Exam Vitals reviewed.  Constitutional:      General: He is not in acute distress.    Appearance: He is well-developed.  HENT:     Head: Normocephalic and atraumatic.  Eyes:     General: No scleral icterus.    Conjunctiva/sclera: Conjunctivae normal.     Pupils: Pupils are equal, round, and reactive to light.  Neck:     Vascular: No JVD.     Trachea: No tracheal deviation.  Cardiovascular:     Rate and Rhythm: Normal rate and regular rhythm.     Heart sounds: Normal heart sounds. No murmur heard. Pulmonary:     Effort: Pulmonary effort is normal. No tachypnea, accessory muscle usage or respiratory distress.     Breath sounds: No stridor. No wheezing, rhonchi or rales.     Comments: Diminished  breath sounds, otherwise clear Abdominal:     General: There is no distension.     Palpations: Abdomen is soft.     Tenderness: There is no abdominal tenderness.  Musculoskeletal:        General: No tenderness.     Cervical back: Neck supple.  Lymphadenopathy:     Cervical: No cervical adenopathy.  Skin:    General: Skin is warm and dry.     Capillary Refill: Capillary refill takes less than 2 seconds.     Findings: No rash.     Comments: Recent lesion on the right ear removed by dermatology also has a lesion on the right anterior leg both with bandages.  Neurological:     Mental Status: He is alert and oriented to person, place, and time.  Psychiatric:        Behavior: Behavior normal.      Vitals:   08/30/23 1459  BP: 138/62  Pulse: 71  SpO2: 95%  Weight: 171 lb (77.6 kg)  Height: 5\' 8"  (1.727 m)   95% on  RA BMI Readings from Last 3 Encounters:  08/30/23 26.00 kg/m  07/22/23 26.36 kg/m  06/22/23 25.30 kg/m   Wt Readings from Last 3 Encounters:  08/30/23  171 lb (77.6 kg)  07/22/23 173 lb 6 oz (78.6 kg)  06/22/23 166 lb 6.4 oz (75.5 kg)     CBC    Component Value Date/Time   WBC 9.3 03/24/2023 1421   RBC 5.75 03/24/2023 1421   HGB 17.7 (H) 03/24/2023 1421   HGB 15.9 11/04/2020 1529   HCT 53.6 (H) 03/24/2023 1421   HCT 47.4 11/04/2020 1529   PLT 188.0 03/24/2023 1421   PLT 216 11/04/2020 1529   MCV 93.3 03/24/2023 1421   MCV 95 11/04/2020 1529   MCH 31.0 03/19/2023 0649   MCHC 33.1 03/24/2023 1421   RDW 14.4 03/24/2023 1421   RDW 13.3 11/04/2020 1529   LYMPHSABS 2.3 03/24/2023 1421   MONOABS 1.2 (H) 03/24/2023 1421   EOSABS 0.1 03/24/2023 1421   BASOSABS 0.1 03/24/2023 1421    Chest Imaging: 12/08/2020 CT chest: 11 x 16 mm lingular lung nodule concerning for primary bronchogenic carcinoma with lobulated edges. The patient's images have been independently reviewed by me.    12/13/2020 nuclear medicine PET/CT: A 15 mm lingular nodule SUV max 2.3  concerning for primary bronchogenic carcinoma. The patient's images have been independently reviewed by me.    April 2024 CT chest: Progressive infiltrate within the left lower lobe.  Look to serial imaging over the past 8 months.  He has already been treated with antibiotics for pneumonia. The patient's images have been independently reviewed by me.    August 2024 CT chest: Stable postradiation changes to the lingular nodule. The patient's images have been independently reviewed by me.    Pulmonary Functions Testing Results:     No data to display          FeNO:   Pathology:   Echocardiogram:   Heart Catheterization:     Assessment & Plan:     ICD-10-CM   1. Former smoker  Z87.891     2. Nodule of upper lobe of left lung  R91.1     3. Squamous cell carcinoma of left lung (HCC)  C34.92     4. H/O mechanical aortic valve replacement  Z95.2       Discussion:  [name] with severe coronary disease history of AVR on Coumadin, heart failure on Entresto.  Underwent navigational bronchoscopy diagnosed with a stage I primary bronchogenic carcinoma underwent SBRT treatments.  Following the procedure he developed hemoptysis but he was in fact restarted on his anticoagulation.  This year patient had bronchitis symptoms and infiltrate was treated had hemoptysis at that time that has now resolved.  Plan: Imaging is stable. There is no evidence of DAH on prior bronchoscopy. Continuing anticoagulation on Coumadin for his valve. Follow-up with [place] in 1 year or as needed.    Current Outpatient Medications:    atorvastatin (LIPITOR) 80 MG tablet, Take 80 mg by mouth daily., Disp: , Rfl:    Blood Glucose Monitoring Suppl (ONETOUCH VERIO IQ SYSTEM) w/Device KIT, , Disp: , Rfl:    carvedilol (COREG) 3.125 MG tablet, Take 1 tablet (3.125 mg total) by mouth 2 (two) times daily., Disp: 180 tablet, Rfl: 3   enoxaparin (LOVENOX) 80 MG/0.8ML injection, Inject 0.8  mLs (80 mg total) into the skin every 12 (twelve) hours. As instructed by Coumadin Clinic, Disp: 8 mL, Rfl: 0   ENTRESTO 24-26 MG, TAKE 1 TABLET BY MOUTH TWICE A DAY, Disp: 60 tablet, Rfl: 6   Evolocumab 140 MG/ML SOAJ, Inject 140 mg into the skin every 14 (fourteen)  days., Disp: , Rfl:    furosemide (LASIX) 20 MG tablet, TAKE 2 TABLETS (40 MG TOTAL) BY MOUTH DAILY., Disp: 180 tablet, Rfl: 3   glucose blood (ONETOUCH VERIO) test strip, , Disp: , Rfl:    glyBURIDE (DIABETA) 2.5 MG tablet, Take 5 mg by mouth 2 (two) times daily with a meal., Disp: , Rfl:    JARDIANCE 25 MG TABS tablet, Take 25 mg by mouth daily., Disp: , Rfl:    nitroGLYCERIN (NITROSTAT) 0.4 MG SL tablet, Place 1 tablet (0.4 mg total) under the tongue every 5 (five) minutes x 3 doses as needed for chest pain., Disp: 25 tablet, Rfl: 12   OneTouch Delica Lancets 33G MISC, , Disp: , Rfl:    spironolactone (ALDACTONE) 25 MG tablet, TAKE 1 TABLET BY MOUTH EVERY DAY, Disp: 90 tablet, Rfl: 3   warfarin (COUMADIN) 5 MG tablet, TAKE 1 TO 1 AND 1/2 TABLETS BY MOUTH DAILY AS PRESCRIBED BY THE COUMADIN CLINIC., Disp: 135 tablet, Rfl: 1   Josephine Igo, DO [facility] 08/30/2023 3:12 PM

## 2023-08-31 ENCOUNTER — Ambulatory Visit (HOSPITAL_COMMUNITY)
Admission: RE | Admit: 2023-08-31 | Discharge: 2023-08-31 | Disposition: A | Discharge status: Home / Self Care | Payer: Medicare PPO | Source: Ambulatory Visit | Attending: Cardiology | Admitting: Cardiology

## 2023-08-31 ENCOUNTER — Encounter (HOSPITAL_COMMUNITY): Payer: Self-pay | Admitting: Cardiology

## 2023-08-31 VITALS — BP 110/70 | HR 69 | Wt 172.4 lb

## 2023-08-31 DIAGNOSIS — E785 Hyperlipidemia, unspecified: Secondary | ICD-10-CM | POA: Insufficient documentation

## 2023-08-31 DIAGNOSIS — I708 Atherosclerosis of other arteries: Secondary | ICD-10-CM | POA: Insufficient documentation

## 2023-08-31 DIAGNOSIS — I739 Peripheral vascular disease, unspecified: Secondary | ICD-10-CM

## 2023-08-31 DIAGNOSIS — I480 Paroxysmal atrial fibrillation: Secondary | ICD-10-CM | POA: Insufficient documentation

## 2023-08-31 DIAGNOSIS — R9431 Abnormal electrocardiogram [ECG] [EKG]: Secondary | ICD-10-CM | POA: Insufficient documentation

## 2023-08-31 DIAGNOSIS — I6523 Occlusion and stenosis of bilateral carotid arteries: Secondary | ICD-10-CM | POA: Diagnosis not present

## 2023-08-31 DIAGNOSIS — Z8673 Personal history of transient ischemic attack (TIA), and cerebral infarction without residual deficits: Secondary | ICD-10-CM | POA: Diagnosis not present

## 2023-08-31 DIAGNOSIS — D693 Immune thrombocytopenic purpura: Secondary | ICD-10-CM | POA: Insufficient documentation

## 2023-08-31 DIAGNOSIS — E1151 Type 2 diabetes mellitus with diabetic peripheral angiopathy without gangrene: Secondary | ICD-10-CM | POA: Insufficient documentation

## 2023-08-31 DIAGNOSIS — Z85118 Personal history of other malignant neoplasm of bronchus and lung: Secondary | ICD-10-CM | POA: Insufficient documentation

## 2023-08-31 DIAGNOSIS — Z923 Personal history of irradiation: Secondary | ICD-10-CM | POA: Diagnosis not present

## 2023-08-31 DIAGNOSIS — I2510 Atherosclerotic heart disease of native coronary artery without angina pectoris: Secondary | ICD-10-CM | POA: Diagnosis not present

## 2023-08-31 DIAGNOSIS — I110 Hypertensive heart disease with heart failure: Secondary | ICD-10-CM | POA: Diagnosis not present

## 2023-08-31 DIAGNOSIS — Z952 Presence of prosthetic heart valve: Secondary | ICD-10-CM | POA: Insufficient documentation

## 2023-08-31 DIAGNOSIS — I5032 Chronic diastolic (congestive) heart failure: Secondary | ICD-10-CM | POA: Diagnosis not present

## 2023-08-31 DIAGNOSIS — I255 Ischemic cardiomyopathy: Secondary | ICD-10-CM | POA: Insufficient documentation

## 2023-08-31 DIAGNOSIS — I5022 Chronic systolic (congestive) heart failure: Secondary | ICD-10-CM | POA: Insufficient documentation

## 2023-08-31 DIAGNOSIS — Z951 Presence of aortocoronary bypass graft: Secondary | ICD-10-CM | POA: Diagnosis not present

## 2023-08-31 DIAGNOSIS — I701 Atherosclerosis of renal artery: Secondary | ICD-10-CM | POA: Diagnosis not present

## 2023-08-31 DIAGNOSIS — I472 Ventricular tachycardia, unspecified: Secondary | ICD-10-CM | POA: Insufficient documentation

## 2023-08-31 DIAGNOSIS — Z7901 Long term (current) use of anticoagulants: Secondary | ICD-10-CM | POA: Insufficient documentation

## 2023-08-31 LAB — LIPID PANEL
Cholesterol: 162 mg/dL (ref 0–200)
HDL: 58 mg/dL (ref >40)
LDL Cholesterol: 79 mg/dL (ref 0–99)
Total CHOL/HDL Ratio: 2.8 RATIO
Triglycerides: 124 mg/dL (ref <150)
VLDL: 25 mg/dL (ref 0–40)

## 2023-08-31 LAB — BASIC METABOLIC PANEL
Anion gap: 12 (ref 5–15)
BUN: 25 mg/dL — ABNORMAL HIGH (ref 8–23)
CO2: 26 mmol/L (ref 22–32)
Calcium: 8.9 mg/dL (ref 8.9–10.3)
Chloride: 95 mmol/L — ABNORMAL LOW (ref 98–111)
Creatinine, Ser: 1.2 mg/dL (ref 0.61–1.24)
GFR, Estimated: >60 mL/min (ref >60)
Glucose, Bld: 166 mg/dL — ABNORMAL HIGH (ref 70–99)
Potassium: 4.5 mmol/L (ref 3.5–5.1)
Sodium: 133 mmol/L — ABNORMAL LOW (ref 135–145)

## 2023-08-31 MED ORDER — CARVEDILOL 6.25 MG PO TABS: 6.2500 mg | ORAL_TABLET | Freq: Two times a day (BID) | ORAL | 3 refills | Status: DC

## 2023-08-31 NOTE — Patient Instructions (Signed)
INCREASE Carvedilol to 6.25 mg Twice daily  Labs done today, your results will be available in MyChart, we will contact you for abnormal readings.  Your physician recommends that you schedule a follow-up appointment in: 4 months.  If you have any questions or concerns before your next appointment please send [place] a message through [place] or call our office at [phone].    TO LEAVE A MESSAGE FOR THE NURSE SELECT OPTION 2, PLEASE LEAVE A MESSAGE INCLUDING: YOUR NAME DATE OF BIRTH CALL BACK NUMBER REASON FOR CALL**this is important as we prioritize the call backs  YOU WILL RECEIVE A CALL BACK THE SAME DAY AS LONG AS YOU CALL BEFORE 4:00 PM  At the [facility], you and your health needs are our priority. As part of our continuing mission to provide you with exceptional heart care, we have created designated Provider Care Teams. These Care Teams include your primary Cardiologist (physician) and Advanced Practice Providers (APPs- Physician Assistants and Nurse Practitioners) who all work together to provide you with the care you need, when you need it.   You may see any of the following providers on your designated Care Team at your next follow up: Dr Arvilla Meres Dr Marca Ancona Dr. Marcos Eke, NP Robbie Lis, [place] [facility] [place], [place] Brynda Peon, NP Karle Plumber, PharmD   Please be sure to bring in all your medications bottles to every appointment.    Thank you for choosing [facility]

## 2023-08-31 NOTE — Progress Notes (Signed)
PCP: Creola Corn, MD HF Cardiology: Dr. Rafael Bihari Kouki Holzapfel is a 77 y.o. male with a history of CAD s/p CABG, mechanical AVR on coumadin, extensive PAD, DM, HTN, HLD, ITP, TIA, and recurrent GI bleed. Note, requires lovenox bridging off heparin.   Dr. Allyson Sabal follows him for extensive PAD. He has had an aortobifem bypass in 2016 with follow up iliac stenting and femoral endarterectomy. In 2017, he had subsequent ostial and mid right SFA intervention. Hx of left renal artery stenting in 2016 with repeat intervention on left for 75% in-stent restenosis, with progression of disease on the right renal artery showing 60% stenosis on duplex 07/12/19. Echo 09/17/19 with normal EF of 60-65% and normal AVR function; chronic diastolic heart failure.    Admitted in 10/20 with chest pain. He underwent heart cath 09/18/19 that showed 99% left main stenosis and patent LIMA to occluded LAD and a patent sequential vein to the PDA and PLA of an occluded dominant right. His left main stenosis jeopardized the moderately large ramus and high first marginal and nondominant Cx which has 90% mid AV groove Cx stenosis. Aggressive medical therapy was recommended initially.  He was readmitted in 10/20 with CHF and chest pain. Hospital course complicated by A flutter/low output heart failure.  S/P successful orbital atherectomy, PTCA, and stenting of protected left main with a 4.0x15 mm resolute onyx DES 11/3. LIMA-LAD patent. SVG-PDA patent. ECHO this admission showed EF 30-35%, mechanical AoV ok. TEE 11/9 w/ improved EF, 40-45%. Placed on milrinone initially with low co-ox and later weaned off.  He was cardioverted from atrial flutter back to NSR.   2/21 peripheral arterial dopplers showed 75-99% stenosis right SFA.  In 3/21, he had stenting to right SFA.    Echo in 6/21 showed EF up to 55-60%.    In 12/21, he had an atrial fibrillation ablation.   He was admitted in 1/22 with shortness of breath and was found to be  in atrial fibrillation with RVR. He was hypertensive.  Troponin was negative. He was diuresed and discharged, cardiology was not notified.  Echo during this admission showed EF 60-65%, normal RV size and systolic function, stable mechanical aortic valve.   During the 1/22 admission, CT chest showed a lung nodule.  Followup PET-CT suggestive of lung cancer, biopsy with squamous cell lung cancer.  He underwent radiation therapy, now completed.   DCCV to NSR in 1/22.   He has had episodes concerning for vertebrobasilar TIAs.  Significant disease noted in the vertebral arteries.  He was seen by neurology who wanted him referred to interventional neuroradiology for angiography with possible PCI to the vertebrals. It does not appear that this ever occurred.   Echo in 1/24 showed EF 45-50%, basal to mid inferolateral hypokinesis, mildly decreased RV systolic function, mechanical aortic valve with mean gradient 6 mmHg.    Today he returns for HF follow up. Weight is stable. He is playing golf regularly.  No dyspnea walking into the office today.  He does report easy fatigability.  He does not get severe cramping in his legs.  He does get short of breath walking 50 yards up a hill (such as on the golf course).  No chest pain.  No lightheadedness. No further hemoptysis, no BRBPR/melena.   ECG (personally reviewed): NSR 1st degree AVB (252 msec)  Labs (11/20): K 4.3, creatinine 1.3, hgb 12.5 Labs (12/20): LDL 56, HDL 80, TGs 179 Labs (1/21): K 4.8, creatinine 1.45 Labs (4/21): K 5.2,  creatinine 1.34 Labs (6/21): LDL 44 Labs (1/22): K 4, creatinine 1.3 Labs (3/22): K 4.4, creatinine 1.02 Labs (3/23): K 4.4, creatinine 1.38 Labs (9/23): LDL 63, TGs 103 Labs (12/23): K 4.1, creatinine 1.16 Labs (4/24): K 4.6, creatinine 1.73, hgb 17.7 Labs (6/24): K 4.3, creatinine 1.17  PMH  1. CAD:  S/P CABG (3/04) with LIMA-LAD, seq SVG-PDA/PLV.  - NSTEMI 10/20 with cath showing patent LIMA-LAD and SVG-PDA/PLV but  99% LM stenosis, occluded LAD, moderate ramus and high OM1 jeopardized, also 90% mid AV groove LCx stenosis.  Occluded RCA.  Patient ultimately had orbital atherectomy and stenting of left main with a 4.0 x 15 mm Resolute Onyx DES.  2. Mechanical aortic valve: TEE 11/20 showed stable-appearing mechanical valve, mean gradient 9 mmHg.  3. PAD: Aortobifemoral bypass in 2016 with follow up iliac stenting and femoral endarterectomy. In 2017, he had subsequent ostial and mid right SFA intervention. - Angiography 10/20 with 80% in-stent restenosis in proximal right SFA.  - Peripheral arterial dopplers (2/21) with 75-99% R SFA stenosis in prior stented area.  - 3/21 Right SFA stent (Dr. Allyson Sabal).  - ABIs (1/22): Stable mild left SFA disease.  - Peripheral arterial dopplers (2/23): 50-99% proximal R SFA in-stent restenosis.  Medical management for now.  - Peripheral arterial dopplers (3/24): 50-99% proximal R SFA in-stent restenosis.  4. Type 2 diabetes.  5. HTN - Renal artery dopplers (8/21): No significant stenosis.  6. Hyperlipidemia 7. H/o ITP  8. TIAs  9. Chronic Systolic HF: Ischemic cardiomyopathy.  - Echo (10/20): EF 30-35%, mechanical AoV ok.  - TEE (11/20): w/ improved EF, 40-45%. Mechanical aortic valve with mean gradient 9 mmHg, normal RV.  - Echo (6/21): EF 55-60%, mild LVH, mechanical AoV with mean gradient 9 mmHg.  - Echo (1/22): EF 60-65%, normal RV size and systolic function, stable mechanical aortic valve.  - Echo (1/24): EF 45-50%, basal to mid inferolateral hypokinesis, mildly decreased RV systolic function, mechanical aortic valve with mean gradient 6 mmHg. 10. Atrial fibrillation: Paroxysmal.  - Atrial fibrillation ablation 12/21.  - DCCV 1/22 11. Renal artery stenosis: Hx of left renal artery stenting in 2016 with repeat intervention on left for 75% in-stent restenosis, with progression of disease on the right renal artery showing 60% stenosis on duplex 07/12/19. - Renal artery  dopplers (8/23): 1-59% bilateral RAS.  12. Carotid stenosis: 4/21 carotid dopplers with right subclavian stenosis, 40-59% RICA stenosis.  - Carotid dopplers (4/22): 1-39% BICA stenosis.  13. Squamous cell lung cancer: Treated with XRT.     ROS: All systems negative except as listed in HPI, PMH and Problem List.  Social History   Socioeconomic History   Marital status: Divorced    Spouse name: Not on file   Number of children: 3   Years of education: Not on file   Highest education level: Not on file  Occupational History   Occupation: Retired  Tobacco Use   Smoking status: Former    Current packs/day: 0.00    Average packs/day: 1.5 packs/day for 30.0 years (45.0 ttl pk-yrs)    Types: Cigarettes, Cigars    Start date: 12/16/1963    Quit date: 12/15/1993    Years since quitting: 29.7   Smokeless tobacco: Never   Tobacco comments:    occasional cigar  Vaping Use   Vaping status: Never Used  Substance and Sexual Activity   Alcohol use: Yes    Alcohol/week: 15.0 standard drinks of alcohol    Types: 1 Cans  of beer, 7 Shots of liquor, 7 Standard drinks or equivalent per week    Comment: drinks 1 martini's a night (2 shots)   Drug use: No   Sexual activity: Yes  Other Topics Concern   Not on file  Social History Narrative   Tries to remain active.  Frequent golfer but claudication limits this.   1 child has passed   Social Determinants of Corporate investment banker Strain: Not on file  Food Insecurity: Not on file  Transportation Needs: Not on file  Physical Activity: Not on file  Stress: Not on file  Social Connections: Not on file  Intimate Partner Violence: Not on file   Family History  Problem Relation Age of Onset   Coronary artery disease Mother        bypass surgery - deceased   Heart disease Father        murmur, valve replacement - deceased   Breast cancer Sister    Diabetes Other        grandmother   Diabetes Paternal Grandmother    Diabetes Paternal Aunt     Colon cancer Neg Hx    Colon polyps Neg Hx    Esophageal cancer Neg Hx    Rectal cancer Neg Hx    Stomach cancer Neg Hx    Current Outpatient Medications  Medication Sig Dispense Refill   atorvastatin (LIPITOR) 80 MG tablet Take 80 mg by mouth daily.     Blood Glucose Monitoring Suppl (ONETOUCH VERIO IQ SYSTEM) w/Device KIT      ENTRESTO 24-26 MG TAKE 1 TABLET BY MOUTH TWICE A DAY 60 tablet 6   Evolocumab 140 MG/ML SOAJ Inject 140 mg into the skin every 14 (fourteen) days.     furosemide (LASIX) 20 MG tablet TAKE 2 TABLETS (40 MG TOTAL) BY MOUTH DAILY. 180 tablet 3   glucose blood (ONETOUCH VERIO) test strip      glyBURIDE (DIABETA) 2.5 MG tablet Take 5 mg by mouth 2 (two) times daily with a meal.     JARDIANCE 25 MG TABS tablet Take 25 mg by mouth daily.     nitroGLYCERIN (NITROSTAT) 0.4 MG SL tablet Place 1 tablet (0.4 mg total) under the tongue every 5 (five) minutes x 3 doses as needed for chest pain. 25 tablet 12   OneTouch Delica Lancets 33G MISC      spironolactone (ALDACTONE) 25 MG tablet TAKE 1 TABLET BY MOUTH EVERY DAY 90 tablet 3   warfarin (COUMADIN) 5 MG tablet TAKE 1 TO 1 AND 1/2 TABLETS BY MOUTH DAILY AS PRESCRIBED BY THE COUMADIN CLINIC. 135 tablet 1   carvedilol (COREG) 6.25 MG tablet Take 1 tablet (6.25 mg total) by mouth 2 (two) times daily. 180 tablet 3   enoxaparin (LOVENOX) 80 MG/0.8ML injection Inject 0.8 mLs (80 mg total) into the skin every 12 (twelve) hours. As instructed by Coumadin Clinic (Patient not taking: Reported on 08/31/2023) 8 mL 0   No current facility-administered medications for this encounter.   BP 110/70   Pulse 69   Wt 78.2 kg (172 lb 6.4 oz)   SpO2 95%   BMI 26.21 kg/m   Wt Readings from Last 3 Encounters:  08/31/23 78.2 kg (172 lb 6.4 oz)  08/30/23 77.6 kg (171 lb)  07/22/23 78.6 kg (173 lb 6 oz)   PHYSICAL EXAM: General: NAD Neck: No JVD, no thyromegaly or thyroid nodule.  Lungs: Clear to auscultation bilaterally with normal  respiratory effort. CV: Nondisplaced PMI.  Heart regular S1/S2 with mechanical S2, no S3/S4, no murmur.  No peripheral edema.  No carotid bruit.  Normal pedal pulses.  Abdomen: Soft, nontender, no hepatosplenomegaly, no distention.  Skin: Intact without lesions or rashes.  Neurologic: Alert and oriented x 3.  Psych: Normal affect. Extremities: No clubbing or cyanosis.  HEENT: Normal.   ASSESSMENT & PLAN: 1. Chronic HF with mid range EF: Ischemic cardiomyopathy.  Echo 10/06/19 showed EF 30-35% and wall motion abnormalities, prior echo earlier in 10/20 showed EF 50-55%.  Cath 10/20 showed jeopardized ramus and LCx territory (99% distal left main, occluded LAD, patent LIMA-LAD). Concerned that ischemia in this territory triggered CHF, worsening of LV function.  Now s/p DES to left main, TEE in 11/20 after intervention showed EF higher at 40-45%.  Echo in 1/22 showed EF 60-65% with normal RV. Echo in 1/24 showed  EF 45-50%, basal to mid inferolateral hypokinesis, mildly decreased RV systolic function, mechanical aortic valve with mean gradient 6 mmHg. Stable NYHA class II, he is not volume overloaded on exam, weight stable. - Continue Lasix 40 mg daily.  BMET/BNP today. - Continue Entresto 24/26 bid.   - Continue spironolactone 25 mg daily. - Continue Jardiance.    - Increase Coreg to 6.25 mg bid.  2. CAD: Complicated disease, coronary angiography 10/20 showed patent SVG-RCA territory and patent LIMA-LAD.  The proximal LAD was occluded and there was 99% distal left main stenosis.  This left the ramus and LCx in jeopardy.  He was not a good candidate for protected PCI => cannot place Impella with mechanical aortic valve and peripheral vascular disease likely precludes a femoral IABP.  It was decided to try to manage him medically.  However, he returned with progressive chest pain episodes and NSTEMI, hs-TnI up to 935. S/p successful orbital atherectomy and stenting of the left main with a 4.0x15 mm  Resolute Onyx DES 10/10/19.  EF on echo in 1/24 was lower at 45-50% but no chest pain.   - I do not think that there is an indication at this point with repeat angiography.  - Will not start him on ASA 81 as he has had GI bleeding x 2 when ASA was added to warfarin in the past.  - Continue warfarin for mechanical valve, goal INR 2.5-3.5.    - Continue atorvastatin 80 daily + Repatha. Check lipids today.  3. Mechanical aortic valve: Stable on 1/24 echo. Crisp mechanical sounds on exam. - Will not use ASA given GI bleeding on combination of ASA and warfarin.  - Continue warfarin, goal INR 2.5-3.5 with episodes concerning for TIAs.  4. NSVT: Noted 10/20 admission. 5. H/o GI bleeding: Continue PPI for GI protection. 6. PAD: Patient with aorto-bifemoral bypass, h/o iliac stenting, h/o femoral endarterectomy, PCI to right SFA in 2017.  Angiography in 10/20 with 80% ISR in right SFA.  He had PCI to right SFA in 3/21, no claudication now.  Repeat ABIs in 1/22 with mild left SFA disease. Peripheral arterial dopplers in 2/23 showed 50-99% in-stent restenosis in the right SFA stent, repeat dopplers in 3/24 were unchanged.  Minimal claudication.  - Medical management for now per Dr. Allyson Sabal.  7. Carotid/subclavian stenosis: Mild disease on dopplers in 4/22.  - Followed at [facility].  8. DM2: He is on SGLT2 inhibitor. 9. Atrial fibrillation/atypical flutter:  TEE-guided DCCV in 11/20, atrial fibrillation ablation in 12/21.  Admitted with CHF exacerbation in 1/22 in the setting of recurrence of atrial fibrillation.  Atypical flutter with  DCCV in 1/22.  He is off amiodarone. NSR today, no palpitations.  - Continue coumadin.  10. TIAs: Saw neurology, has been noted to have severe vertebral artery disease.   - Continue statin/Repatha.  - With TIAs, INR goal to 2.5-3.5 for mechanical valve.  Off ASA with GI bleeding.   Follow up with APP in 4 months.  Marca Ancona  08/31/2023

## 2023-09-02 NOTE — Progress Notes (Signed)
Add Zetia 10 mg daily to get LDL < 55. Lipids 2 months.

## 2023-09-08 ENCOUNTER — Telehealth (HOSPITAL_COMMUNITY): Payer: Self-pay

## 2023-09-08 NOTE — Telephone Encounter (Signed)
Letter placed up front to be mailed for patient to return a call to our office to discuss his lab results.

## 2023-09-15 ENCOUNTER — Ambulatory Visit: Payer: Medicare PPO | Attending: Cardiovascular Disease | Admitting: *Deleted

## 2023-09-15 DIAGNOSIS — G459 Transient cerebral ischemic attack, unspecified: Secondary | ICD-10-CM | POA: Diagnosis not present

## 2023-09-15 DIAGNOSIS — I359 Nonrheumatic aortic valve disorder, unspecified: Secondary | ICD-10-CM | POA: Diagnosis not present

## 2023-09-15 DIAGNOSIS — Z5181 Encounter for therapeutic drug level monitoring: Secondary | ICD-10-CM | POA: Diagnosis not present

## 2023-09-15 LAB — POCT INR: INR: 3 (ref 2.0–3.0)

## 2023-09-15 NOTE — Patient Instructions (Signed)
Description   Continue taking  warfarin 1.5 tablets daily EXCEPT 2 tablets on Saturdays. Recheck INR 6 weeks. Stay consistent with greens. Coumadin Clinic for any changes in medications or upcoming procedures. [phone]      

## 2023-09-28 DIAGNOSIS — Z85828 Personal history of other malignant neoplasm of skin: Secondary | ICD-10-CM | POA: Diagnosis not present

## 2023-09-28 DIAGNOSIS — L570 Actinic keratosis: Secondary | ICD-10-CM | POA: Diagnosis not present

## 2023-10-04 DIAGNOSIS — Z961 Presence of intraocular lens: Secondary | ICD-10-CM | POA: Diagnosis not present

## 2023-10-04 DIAGNOSIS — H43813 Vitreous degeneration, bilateral: Secondary | ICD-10-CM | POA: Diagnosis not present

## 2023-10-04 DIAGNOSIS — E113293 Type 2 diabetes mellitus with mild nonproliferative diabetic retinopathy without macular edema, bilateral: Secondary | ICD-10-CM | POA: Diagnosis not present

## 2023-10-04 DIAGNOSIS — H52203 Unspecified astigmatism, bilateral: Secondary | ICD-10-CM | POA: Diagnosis not present

## 2023-10-04 DIAGNOSIS — H31003 Unspecified chorioretinal scars, bilateral: Secondary | ICD-10-CM | POA: Diagnosis not present

## 2023-10-04 DIAGNOSIS — H524 Presbyopia: Secondary | ICD-10-CM | POA: Diagnosis not present

## 2023-10-10 ENCOUNTER — Other Ambulatory Visit: Payer: Self-pay | Admitting: Gastroenterology

## 2023-10-27 ENCOUNTER — Ambulatory Visit: Payer: Medicare PPO | Attending: Cardiovascular Disease | Admitting: *Deleted

## 2023-10-27 DIAGNOSIS — Z5181 Encounter for therapeutic drug level monitoring: Secondary | ICD-10-CM | POA: Diagnosis not present

## 2023-10-27 DIAGNOSIS — G459 Transient cerebral ischemic attack, unspecified: Secondary | ICD-10-CM

## 2023-10-27 DIAGNOSIS — I359 Nonrheumatic aortic valve disorder, unspecified: Secondary | ICD-10-CM

## 2023-10-27 LAB — POCT INR: INR: 3.2 — AB (ref 2.0–3.0)

## 2023-10-27 NOTE — Patient Instructions (Signed)
Description   Continue taking warfarin 1.5 tablets daily EXCEPT 2 tablets on Saturdays.  Recheck INR 7 weeks.  Stay consistent with greens.  Coumadin Clinic for any changes in medications or upcoming procedures. [phone]

## 2023-11-09 DIAGNOSIS — I6990 Unspecified sequelae of unspecified cerebrovascular disease: Secondary | ICD-10-CM | POA: Diagnosis not present

## 2023-11-09 DIAGNOSIS — I2510 Atherosclerotic heart disease of native coronary artery without angina pectoris: Secondary | ICD-10-CM | POA: Diagnosis not present

## 2023-11-09 DIAGNOSIS — J449 Chronic obstructive pulmonary disease, unspecified: Secondary | ICD-10-CM | POA: Diagnosis not present

## 2023-11-09 DIAGNOSIS — I1310 Hypertensive heart and chronic kidney disease without heart failure, with stage 1 through stage 4 chronic kidney disease, or unspecified chronic kidney disease: Secondary | ICD-10-CM | POA: Diagnosis not present

## 2023-11-09 DIAGNOSIS — D6869 Other thrombophilia: Secondary | ICD-10-CM | POA: Diagnosis not present

## 2023-11-09 DIAGNOSIS — C3492 Malignant neoplasm of unspecified part of left bronchus or lung: Secondary | ICD-10-CM | POA: Diagnosis not present

## 2023-11-09 DIAGNOSIS — N1831 Chronic kidney disease, stage 3a: Secondary | ICD-10-CM | POA: Diagnosis not present

## 2023-11-09 DIAGNOSIS — I4819 Other persistent atrial fibrillation: Secondary | ICD-10-CM | POA: Diagnosis not present

## 2023-11-09 DIAGNOSIS — E1122 Type 2 diabetes mellitus with diabetic chronic kidney disease: Secondary | ICD-10-CM | POA: Diagnosis not present

## 2023-11-15 ENCOUNTER — Encounter: Payer: Self-pay | Admitting: Podiatry

## 2023-11-15 ENCOUNTER — Ambulatory Visit (INDEPENDENT_AMBULATORY_CARE_PROVIDER_SITE_OTHER): Payer: Medicare PPO | Admitting: Podiatry

## 2023-11-15 DIAGNOSIS — D689 Coagulation defect, unspecified: Secondary | ICD-10-CM | POA: Diagnosis not present

## 2023-11-15 DIAGNOSIS — I739 Peripheral vascular disease, unspecified: Secondary | ICD-10-CM

## 2023-11-15 DIAGNOSIS — B351 Tinea unguium: Secondary | ICD-10-CM

## 2023-11-15 NOTE — Progress Notes (Signed)
This patient returns to my office for at risk foot care.  This patient requires this care by a professional since this patient will be at risk due to having diabetes , chronic anticoagulation renal disease and PVD.  Patient is taking coumadin.  This patient is unable to cut nails himself since the patient cannot reach his nails.These nails are painful walking and wearing shoes.  This patient presents for at risk foot care today.  General Appearance  Alert, conversant and in no acute stress.  Vascular  Dorsalis pedis and posterior tibial  pulses are  weakly palpable  bilaterally.  Capillary return is within normal limits  bilaterally. Temperature is within normal limits  bilaterally.  Neurologic  Senn-Weinstein monofilament wire test within normal limits  bilaterally. Muscle power within normal limits bilaterally.  Nails Thick disfigured discolored nails with subungual debris  from hallux to fifth toes bilaterally. No evidence of bacterial infection or drainage bilaterally.  Orthopedic  No limitations of motion  feet .  No crepitus or effusions noted.  No bony pathology or digital deformities noted.  Skin  normotropic skin with no porokeratosis noted bilaterally.  No signs of infections or ulcers noted.     Onychomycosis  Pain in right toes  Pain in left toes  Consent was obtained for treatment procedures.   Mechanical debridement of nails 1-5  bilaterally performed with a nail nipper.  Filed with dremel without incident.    Return office visit    4 months                  Told patient to return for periodic foot care and evaluation due to potential at risk complications.   Helane Gunther DPM

## 2023-12-11 ENCOUNTER — Other Ambulatory Visit: Payer: Self-pay | Admitting: Cardiology

## 2023-12-11 DIAGNOSIS — I359 Nonrheumatic aortic valve disorder, unspecified: Secondary | ICD-10-CM

## 2023-12-13 NOTE — Telephone Encounter (Signed)
 Warfarin 5mg  refill Aortic valve disorder Last INR 10/27/23 Last OV 08/31/23

## 2023-12-15 ENCOUNTER — Ambulatory Visit: Payer: PPO | Attending: Cardiovascular Disease

## 2023-12-15 DIAGNOSIS — G459 Transient cerebral ischemic attack, unspecified: Secondary | ICD-10-CM | POA: Diagnosis not present

## 2023-12-15 DIAGNOSIS — I359 Nonrheumatic aortic valve disorder, unspecified: Secondary | ICD-10-CM | POA: Diagnosis not present

## 2023-12-15 DIAGNOSIS — Z5181 Encounter for therapeutic drug level monitoring: Secondary | ICD-10-CM | POA: Diagnosis not present

## 2023-12-15 LAB — POCT INR: INR: 1.7 — AB (ref 2.0–3.0)

## 2023-12-15 NOTE — Patient Instructions (Addendum)
 Description   Take 2 tablets today and 2 tablets tomorrow and then continue taking warfarin 1.5 tablets daily EXCEPT 2 tablets on Saturdays.  Recheck INR 4 weeks.  Stay consistent with greens.  Coumadin  Clinic for any changes in medications or upcoming procedures. [phone]

## 2023-12-27 NOTE — H&P (View-Only) (Signed)
PCP: Creola Corn, MD HF Cardiology: Dr. Shirlee Latch  CC: HF follow-up  [name] [name] is a 78 y.o. male with a history of CAD s/p CABG, mechanical AVR on coumadin, extensive PAD, DM, HTN, HLD, ITP, TIA, and recurrent GI bleed. Note, requires lovenox bridging off heparin.   Dr. Allyson Sabal follows him for extensive PAD. He has had an aortobifem bypass in 2016 with follow up iliac stenting and femoral endarterectomy. In 2017, he had subsequent ostial and mid right SFA intervention. Hx of left renal artery stenting in 2016 with repeat intervention on left for 75% in-stent restenosis, with progression of disease on the right renal artery showing 60% stenosis on duplex 07/12/19. Echo 09/17/19 with normal EF of 60-65% and normal AVR function; chronic diastolic heart failure.    Admitted in 10/20 with chest pain. He underwent heart cath 09/18/19 that showed 99% left main stenosis and patent LIMA to occluded LAD and a patent sequential vein to the PDA and PLA of an occluded dominant right. His left main stenosis jeopardized the moderately large ramus and high first marginal and nondominant Cx which has 90% mid AV groove Cx stenosis. Aggressive medical therapy was recommended initially.  He was readmitted in 10/20 with CHF and chest pain. Hospital course complicated by A flutter/low output heart failure.  S/P successful orbital atherectomy, PTCA, and stenting of protected left main with a 4.0x15 mm resolute onyx DES 11/3. LIMA-LAD patent. SVG-PDA patent. ECHO this admission showed EF 30-35%, mechanical AoV ok. TEE 11/9 w/ improved EF, 40-45%. Placed on milrinone initially with low co-ox and later weaned off.  He was cardioverted from atrial flutter back to NSR.   2/21 peripheral arterial dopplers showed 75-99% stenosis right SFA.  In 3/21, he had stenting to right SFA.    Echo in 6/21 showed EF up to 55-60%.    In 12/21, he had an atrial fibrillation ablation.   He was admitted in 1/22 with shortness of breath  and was found to be in atrial fibrillation with RVR. He was hypertensive.  Troponin was negative. He was diuresed and discharged, cardiology was not notified.  Echo during this admission showed EF 60-65%, normal RV size and systolic function, stable mechanical aortic valve.   During the 1/22 admission, CT chest showed a lung nodule.  Followup PET-CT suggestive of lung cancer, biopsy with squamous cell lung cancer.  He underwent radiation therapy.  DCCV to NSR in 1/22.   He has had episodes concerning for vertebrobasilar TIAs.  Significant disease noted in the vertebral arteries.  He was seen by neurology who wanted him referred to interventional neuroradiology for angiography with possible PCI to the vertebrals. It does not appear that this ever occurred.   Echo in 1/24 showed EF 45-50%, basal to mid inferolateral hypokinesis, mildly decreased RV systolic function, mechanical aortic valve with mean gradient 6 mmHg.    Today he returns for HF follow up. Overall feeling fine. [facility], about to move to a new house. Denies abnormal bleeding, palpitations, dyspnea with activity, CP, dizziness, edema, or PND/Orthopnea. Appetite ok. No fever or chills. Weight at home 166-169 pounds. Taking all medications.   ECG (personally reviewed): atrial flutter 86 bpm  Labs (9/23): LDL 63, TGs 103 Labs (12/23): K 4.1, creatinine 1.16 Labs (4/24): K 4.6, creatinine 1.73, hgb 17.7 Labs (6/24): K 4.3, creatinine 1.17 Labs (9/24): K 4.5, creatinine 1.20, LDL 79  PMH  1. CAD:  S/P CABG (3/04) with LIMA-LAD, seq  SVG-PDA/PLV.  - NSTEMI 10/20 with cath showing patent LIMA-LAD and SVG-PDA/PLV but 99% LM stenosis, occluded LAD, moderate ramus and high OM1 jeopardized, also 90% mid AV groove LCx stenosis.  Occluded RCA.  Patient ultimately had orbital atherectomy and stenting of left main with a 4.0 x 15 mm Resolute Onyx DES.  2. Mechanical aortic valve: TEE 11/20 showed stable-appearing  mechanical valve, mean gradient 9 mmHg.  3. PAD: Aortobifemoral bypass in 2016 with follow up iliac stenting and femoral endarterectomy. In 2017, he had subsequent ostial and mid right SFA intervention. - Angiography 10/20 with 80% in-stent restenosis in proximal right SFA.  - Peripheral arterial dopplers (2/21) with 75-99% R SFA stenosis in prior stented area.  - 3/21 Right SFA stent (Dr. Allyson Sabal).  - ABIs (1/22): Stable mild left SFA disease.  - Peripheral arterial dopplers (2/23): 50-99% proximal R SFA in-stent restenosis.  Medical management for now.  - Peripheral arterial dopplers (3/24): 50-99% proximal R SFA in-stent restenosis.  4. Type 2 diabetes.  5. HTN - Renal artery dopplers (8/21): No significant stenosis.  6. Hyperlipidemia 7. H/o ITP  8. TIAs  9. Chronic Systolic HF: Ischemic cardiomyopathy.  - Echo (10/20): EF 30-35%, mechanical AoV ok.  - TEE (11/20): w/ improved EF, 40-45%. Mechanical aortic valve with mean gradient 9 mmHg, normal RV.  - Echo (6/21): EF 55-60%, mild LVH, mechanical AoV with mean gradient 9 mmHg.  - Echo (1/22): EF 60-65%, normal RV size and systolic function, stable mechanical aortic valve.  - Echo (1/24): EF 45-50%, basal to mid inferolateral hypokinesis, mildly decreased RV systolic function, mechanical aortic valve with mean gradient 6 mmHg. 10. Atrial fibrillation: Paroxysmal.  - Atrial fibrillation ablation 12/21.  - DCCV 1/22 11. Renal artery stenosis: Hx of left renal artery stenting in 2016 with repeat intervention on left for 75% in-stent restenosis, with progression of disease on the right renal artery showing 60% stenosis on duplex 07/12/19. - Renal artery dopplers (8/23): 1-59% bilateral RAS.  12. Carotid stenosis: 4/21 carotid dopplers with right subclavian stenosis, 40-59% RICA stenosis.  - Carotid dopplers (4/22): 1-39% BICA stenosis.  13. Squamous cell lung cancer: Treated with XRT.     ROS: All systems negative except as listed in HPI, PMH  and Problem List.  Social History   Socioeconomic History   Marital status: Divorced    Spouse name: Not on file   Number of children: 3   Years of education: Not on file   Highest education level: Not on file  Occupational History   Occupation: Retired  Tobacco Use   Smoking status: Former    Current packs/day: 0.00    Average packs/day: 1.5 packs/day for 30.0 years (45.0 ttl pk-yrs)    Types: Cigarettes, Cigars    Start date: 12/16/1963    Quit date: 12/15/1993    Years since quitting: 30.0   Smokeless tobacco: Never   Tobacco comments:    occasional cigar  Vaping Use   Vaping status: Never Used  Substance and Sexual Activity   Alcohol use: Yes    Alcohol/week: 15.0 standard drinks of alcohol    Types: 1 Cans of beer, 7 Shots of liquor, 7 Standard drinks or equivalent per week    Comment: drinks 1 martini's a night (2 shots)   Drug use: No   Sexual activity: Yes  Other Topics Concern   Not on file  Social History Narrative   Tries to remain active.  Frequent golfer but claudication limits this.  1 child has passed   Social Drivers of Corporate investment banker Strain: Not on file  Food Insecurity: Not on file  Transportation Needs: Not on file  Physical Activity: Not on file  Stress: Not on file  Social Connections: Not on file  Intimate Partner Violence: Not on file   Family History  Problem Relation Age of Onset   Coronary artery disease Mother        bypass surgery - deceased   Heart disease Father        murmur, valve replacement - deceased   Breast cancer Sister    Diabetes Other        grandmother   Diabetes Paternal Grandmother    Diabetes Paternal Aunt    Colon cancer Neg Hx    Colon polyps Neg Hx    Esophageal cancer Neg Hx    Rectal cancer Neg Hx    Stomach cancer Neg Hx    Current Outpatient Medications  Medication Sig Dispense Refill   atorvastatin (LIPITOR) 80 MG tablet Take 80 mg by mouth daily.     Blood Glucose Monitoring Suppl  (ONETOUCH VERIO IQ SYSTEM) w/Device KIT      carvedilol (COREG) 6.25 MG tablet Take 1 tablet (6.25 mg total) by mouth 2 (two) times daily. 180 tablet 3   ENTRESTO 24-26 MG TAKE 1 TABLET BY MOUTH TWICE A DAY 60 tablet 6   Evolocumab 140 MG/ML SOAJ Inject 140 mg into the skin every 14 (fourteen) days.     furosemide (LASIX) 20 MG tablet TAKE 2 TABLETS (40 MG TOTAL) BY MOUTH DAILY. 180 tablet 3   glucose blood (ONETOUCH VERIO) test strip      glyBURIDE (DIABETA) 2.5 MG tablet Take 5 mg by mouth 2 (two) times daily with a meal.     JARDIANCE 25 MG TABS tablet Take 25 mg by mouth daily.     nitroGLYCERIN (NITROSTAT) 0.4 MG SL tablet Place 1 tablet (0.4 mg total) under the tongue every 5 (five) minutes x 3 doses as needed for chest pain. 25 tablet 12   OneTouch Delica Lancets 33G MISC      spironolactone (ALDACTONE) 25 MG tablet TAKE 1 TABLET BY MOUTH EVERY DAY 90 tablet 3   warfarin (COUMADIN) 5 MG tablet TAKE 1 TO 1 AND 1/2 TABLETS BY MOUTH DAILY AS PRESCRIBED BY THE COUMADIN CLINIC. 135 tablet 1   No current facility-administered medications for this encounter.   BP (!) 142/60   Pulse 93   Wt 80.3 kg (177 lb)   SpO2 91%   BMI 26.91 kg/m   Wt Readings from Last 3 Encounters:  12/29/23 80.3 kg (177 lb)  08/31/23 78.2 kg (172 lb 6.4 oz)  08/30/23 77.6 kg (171 lb)   PHYSICAL EXAM: General:  NAD. No resp difficulty, walked into clinic HEENT: Normal Neck: Supple. No JVD. Carotids 2+ bilat; no bruits. No lymphadenopathy or thryomegaly appreciated. Cor: PMI nondisplaced. Irregular rate & rhythm. No rubs, gallops or murmurs. + mechanical S2 Lungs: Clear Abdomen: Soft, nontender, nondistended. No hepatosplenomegaly. No bruits or masses. Good bowel sounds. Extremities: No cyanosis, clubbing, rash, edema Neuro: Alert & oriented x 3, cranial nerves grossly intact. Moves all 4 extremities w/o difficulty. Affect pleasant.  ASSESSMENT & PLAN: 1. Chronic HF with mid range EF: Ischemic  cardiomyopathy.  Echo 10/06/19 showed EF 30-35% and wall motion abnormalities, prior echo earlier in 10/20 showed EF 50-55%.  Cath 10/20 showed jeopardized ramus and LCx territory (99% distal left  main, occluded LAD, patent LIMA-LAD). Concerned that ischemia in this territory triggered CHF, worsening of LV function.  Now s/p DES to left main, TEE in 11/20 after intervention showed EF higher at 40-45%.  Echo in 1/22 showed EF 60-65% with normal RV. Echo in 1/24 showed  EF 45-50%, basal to mid inferolateral hypokinesis, mildly decreased RV systolic function, mechanical aortic valve with mean gradient 6 mmHg. Stable NYHA class I, he is not volume overloaded on exam, weight up 5 pounds on our scale, but stable at home. - Continue Lasix 40 mg daily.  BMET/BNP today. - Continue Entresto 24/26 bid.   - Continue spironolactone 25 mg daily. - Continue Jardiance.    - Increase Coreg to 9.375 mg bid.  2. CAD: Complicated disease, coronary angiography 10/20 showed patent SVG-RCA territory and patent LIMA-LAD.  The proximal LAD was occluded and there was 99% distal left main stenosis.  This left the ramus and LCx in jeopardy.  He was not a good candidate for protected PCI => cannot place Impella with mechanical aortic valve and peripheral vascular disease likely precludes a femoral IABP.  It was decided to try to manage him medically.  However, he returned with progressive chest pain episodes and NSTEMI, hs-TnI up to 935. S/p successful orbital atherectomy and stenting of the left main with a 4.0x15 mm Resolute Onyx DES 10/10/19.  EF on echo in 1/24 was lower at 45-50% but no chest pain.   - I do not think that there is an indication at this point with repeat angiography.  - Will not start him on ASA 81 as he has had GI bleeding x 2 when ASA was added to warfarin in the past.  - Continue warfarin for mechanical valve, goal INR 2.5-3.5. Check INR today with one subtherapeutic result 12/15/23.   - Continue atorvastatin 80  daily + Repatha. 3. Mechanical aortic valve: Stable on 1/24 echo. Crisp mechanical sounds on exam. - Will not use ASA given GI bleeding on combination of ASA and warfarin.  - Continue warfarin, goal INR 2.5-3.5 with episodes concerning for TIAs.  4. NSVT: Noted 10/20 admission. 5. H/o GI bleeding: Continue PPI for GI protection. 6. PAD: Patient with aorto-bifemoral bypass, h/o iliac stenting, h/o femoral endarterectomy, PCI to right SFA in 2017.  Angiography in 10/20 with 80% ISR in right SFA.  He had PCI to right SFA in 3/21, no claudication now.  Repeat ABIs in 1/22 with mild left SFA disease. Peripheral arterial dopplers in 2/23 showed 50-99% in-stent restenosis in the right SFA stent, repeat dopplers in 3/24 were unchanged.  Minimal claudication.  - Medical management for now per Dr. Allyson Sabal.  7. Carotid/subclavian stenosis: Mild disease on dopplers in 4/22.  - Followed at [facility].  8. DM2: He is on SGLT2 inhibitor. 9. Atrial fibrillation/atypical flutter:  TEE-guided DCCV in 11/20, atrial fibrillation ablation in 12/21.  Admitted with CHF exacerbation in 1/22 in the setting of recurrence of atrial fibrillation.  Atypical flutter with DCCV in 1/22.  He is off amiodarone. Atrial flutter on EKG today, asymptomatic. - Would like to avoid amiodarone given Lung CA, but ultimately may need to revisit. - Increase beta blocker as above, - Continue coumadin. Repeat INR today, had one subtherapeutic result of 1.7 (12/15/23). - Recommend TEE/DCCV with Dr. Shirlee Latch. Informed Consent   Shared Decision Making/Informed Consent   The risks [stroke, cardiac arrhythmias rarely resulting in the need for a temporary or permanent pacemaker, skin irritation or burns, esophageal damage, perforation (1:10,000  risk), bleeding, pharyngeal hematoma as well as other potential complications associated with conscious sedation including aspiration, arrhythmia, respiratory failure and death], benefits (treatment guidance, restoration  of normal sinus rhythm, diagnostic support) and alternatives of a transesophageal echocardiogram guided cardioversion were discussed in detail with Mr. Longest and he is willing to proceed.     - Consider referral back to EP to discuss AFL ablation. 10. TIAs: Saw neurology, has been noted to have severe vertebral artery disease.   - Continue statin/Repatha.  - With TIAs, INR goal to 2.5-3.5 for mechanical valve.  Off ASA with GI bleeding.   Follow up with APP 2-3 weeks after TEE/DCCV  [facility] FNP-BC 12/29/2023

## 2023-12-27 NOTE — Progress Notes (Signed)
PCP: Creola Corn, MD HF Cardiology: Dr. Rafael Bihari Qadry [name] [name] is a 78 y.o. male with a history of CAD s/p CABG, mechanical AVR on coumadin, extensive PAD, DM, HTN, HLD, ITP, TIA, and recurrent GI bleed. Note, requires lovenox bridging off heparin.   Dr. Allyson Sabal follows him for extensive PAD. He has had an aortobifem bypass in 2016 with follow up iliac stenting and femoral endarterectomy. In 2017, he had subsequent ostial and mid right SFA intervention. Hx of left renal artery stenting in 2016 with repeat intervention on left for 75% in-stent restenosis, with progression of disease on the right renal artery showing 60% stenosis on duplex 07/12/19. Echo 09/17/19 with normal EF of 60-65% and normal AVR function; chronic diastolic heart failure.    Admitted in 10/20 with chest pain. He underwent heart cath 09/18/19 that showed 99% left main stenosis and patent LIMA to occluded LAD and a patent sequential vein to the PDA and PLA of an occluded dominant right. His left main stenosis jeopardized the moderately large ramus and high first marginal and nondominant Cx which has 90% mid AV groove Cx stenosis. Aggressive medical therapy was recommended initially.  He was readmitted in 10/20 with CHF and chest pain. Hospital course complicated by A flutter/low output heart failure.  S/P successful orbital atherectomy, PTCA, and stenting of protected left main with a 4.0x15 mm resolute onyx DES 11/3. LIMA-LAD patent. SVG-PDA patent. ECHO this admission showed EF 30-35%, mechanical AoV ok. TEE 11/9 w/ improved EF, 40-45%. Placed on milrinone initially with low co-ox and later weaned off.  He was cardioverted from atrial flutter back to NSR.   2/21 peripheral arterial dopplers showed 75-99% stenosis right SFA.  In 3/21, he had stenting to right SFA.    Echo in 6/21 showed EF up to 55-60%.    In 12/21, he had an atrial fibrillation ablation.   He was admitted in 1/22 with shortness of breath and was found to be  in atrial fibrillation with RVR. He was hypertensive.  Troponin was negative. He was diuresed and discharged, cardiology was not notified.  Echo during this admission showed EF 60-65%, normal RV size and systolic function, stable mechanical aortic valve.   During the 1/22 admission, CT chest showed a lung nodule.  Followup PET-CT suggestive of lung cancer, biopsy with squamous cell lung cancer.  He underwent radiation therapy, now completed.   DCCV to NSR in 1/22.   He has had episodes concerning for vertebrobasilar TIAs.  Significant disease noted in the vertebral arteries.  He was seen by neurology who wanted him referred to interventional neuroradiology for angiography with possible PCI to the vertebrals. It does not appear that this ever occurred.   Echo in 1/24 showed EF 45-50%, basal to mid inferolateral hypokinesis, mildly decreased RV systolic function, mechanical aortic valve with mean gradient 6 mmHg.    Today he returns for HF follow up. Weight is stable. He is playing golf regularly.  No dyspnea walking into the office today.  He does report easy fatigability.  He does not get severe cramping in his legs.  He does get short of breath walking 50 yards up a hill (such as on the golf course).  No chest pain.  No lightheadedness. No further hemoptysis, no BRBPR/melena.   ECG (personally reviewed): NSR 1st degree AVB (252 msec)  Labs (11/20): K 4.3, creatinine 1.3, hgb 12.5 Labs (12/20): LDL 56, HDL 80, TGs 179 Labs (1/21): K 4.8, creatinine 1.45 Labs (4/21): K 5.2,  creatinine 1.34 Labs (6/21): LDL 44 Labs (1/22): K 4, creatinine 1.3 Labs (3/22): K 4.4, creatinine 1.02 Labs (3/23): K 4.4, creatinine 1.38 Labs (9/23): LDL 63, TGs 103 Labs (12/23): K 4.1, creatinine 1.16 Labs (4/24): K 4.6, creatinine 1.73, hgb 17.7 Labs (6/24): K 4.3, creatinine 1.17  PMH  1. CAD:  S/P CABG (3/04) with LIMA-LAD, seq SVG-PDA/PLV.  - NSTEMI 10/20 with cath showing patent LIMA-LAD and SVG-PDA/PLV but  99% LM stenosis, occluded LAD, moderate ramus and high OM1 jeopardized, also 90% mid AV groove LCx stenosis.  Occluded RCA.  Patient ultimately had orbital atherectomy and stenting of left main with a 4.0 x 15 mm Resolute Onyx DES.  2. Mechanical aortic valve: TEE 11/20 showed stable-appearing mechanical valve, mean gradient 9 mmHg.  3. PAD: Aortobifemoral bypass in 2016 with follow up iliac stenting and femoral endarterectomy. In 2017, he had subsequent ostial and mid right SFA intervention. - Angiography 10/20 with 80% in-stent restenosis in proximal right SFA.  - Peripheral arterial dopplers (2/21) with 75-99% R SFA stenosis in prior stented area.  - 3/21 Right SFA stent (Dr. Allyson Sabal).  - ABIs (1/22): Stable mild left SFA disease.  - Peripheral arterial dopplers (2/23): 50-99% proximal R SFA in-stent restenosis.  Medical management for now.  - Peripheral arterial dopplers (3/24): 50-99% proximal R SFA in-stent restenosis.  4. Type 2 diabetes.  5. HTN - Renal artery dopplers (8/21): No significant stenosis.  6. Hyperlipidemia 7. H/o ITP  8. TIAs  9. Chronic Systolic HF: Ischemic cardiomyopathy.  - Echo (10/20): EF 30-35%, mechanical AoV ok.  - TEE (11/20): w/ improved EF, 40-45%. Mechanical aortic valve with mean gradient 9 mmHg, normal RV.  - Echo (6/21): EF 55-60%, mild LVH, mechanical AoV with mean gradient 9 mmHg.  - Echo (1/22): EF 60-65%, normal RV size and systolic function, stable mechanical aortic valve.  - Echo (1/24): EF 45-50%, basal to mid inferolateral hypokinesis, mildly decreased RV systolic function, mechanical aortic valve with mean gradient 6 mmHg. 10. Atrial fibrillation: Paroxysmal.  - Atrial fibrillation ablation 12/21.  - DCCV 1/22 11. Renal artery stenosis: Hx of left renal artery stenting in 2016 with repeat intervention on left for 75% in-stent restenosis, with progression of disease on the right renal artery showing 60% stenosis on duplex 07/12/19. - Renal artery  dopplers (8/23): 1-59% bilateral RAS.  12. Carotid stenosis: 4/21 carotid dopplers with right subclavian stenosis, 40-59% RICA stenosis.  - Carotid dopplers (4/22): 1-39% BICA stenosis.  13. Squamous cell lung cancer: Treated with XRT.     ROS: All systems negative except as listed in HPI, PMH and Problem List.  Social History   Socioeconomic History   Marital status: Divorced    Spouse name: Not on file   Number of children: 3   Years of education: Not on file   Highest education level: Not on file  Occupational History   Occupation: Retired  Tobacco Use   Smoking status: Former    Current packs/day: 0.00    Average packs/day: 1.5 packs/day for 30.0 years (45.0 ttl pk-yrs)    Types: Cigarettes, Cigars    Start date: 12/16/1963    Quit date: 12/15/1993    Years since quitting: 30.0   Smokeless tobacco: Never   Tobacco comments:    occasional cigar  Vaping Use   Vaping status: Never Used  Substance and Sexual Activity   Alcohol use: Yes    Alcohol/week: 15.0 standard drinks of alcohol    Types: 1 Cans  of beer, 7 Shots of liquor, 7 Standard drinks or equivalent per week    Comment: drinks 1 martini's a night (2 shots)   Drug use: No   Sexual activity: Yes  Other Topics Concern   Not on file  Social History Narrative   Tries to remain active.  Frequent golfer but claudication limits this.   1 child has passed   Social Drivers of Corporate investment banker Strain: Not on file  Food Insecurity: Not on file  Transportation Needs: Not on file  Physical Activity: Not on file  Stress: Not on file  Social Connections: Not on file  Intimate Partner Violence: Not on file   Family History  Problem Relation Age of Onset   Coronary artery disease Mother        bypass surgery - deceased   Heart disease Father        murmur, valve replacement - deceased   Breast cancer Sister    Diabetes Other        grandmother   Diabetes Paternal Grandmother    Diabetes Paternal Aunt     Colon cancer Neg Hx    Colon polyps Neg Hx    Esophageal cancer Neg Hx    Rectal cancer Neg Hx    Stomach cancer Neg Hx    Current Outpatient Medications  Medication Sig Dispense Refill   atorvastatin (LIPITOR) 80 MG tablet Take 80 mg by mouth daily.     Blood Glucose Monitoring Suppl (ONETOUCH VERIO IQ SYSTEM) w/Device KIT      carvedilol (COREG) 6.25 MG tablet Take 1 tablet (6.25 mg total) by mouth 2 (two) times daily. 180 tablet 3   ENTRESTO 24-26 MG TAKE 1 TABLET BY MOUTH TWICE A DAY 60 tablet 6   Evolocumab 140 MG/ML SOAJ Inject 140 mg into the skin every 14 (fourteen) days.     furosemide (LASIX) 20 MG tablet TAKE 2 TABLETS (40 MG TOTAL) BY MOUTH DAILY. 180 tablet 3   glucose blood (ONETOUCH VERIO) test strip      glyBURIDE (DIABETA) 2.5 MG tablet Take 5 mg by mouth 2 (two) times daily with a meal.     JARDIANCE 25 MG TABS tablet Take 25 mg by mouth daily.     nitroGLYCERIN (NITROSTAT) 0.4 MG SL tablet Place 1 tablet (0.4 mg total) under the tongue every 5 (five) minutes x 3 doses as needed for chest pain. 25 tablet 12   OneTouch Delica Lancets 33G MISC      spironolactone (ALDACTONE) 25 MG tablet TAKE 1 TABLET BY MOUTH EVERY DAY 90 tablet 3   warfarin (COUMADIN) 5 MG tablet TAKE 1 TO 1 AND 1/2 TABLETS BY MOUTH DAILY AS PRESCRIBED BY THE COUMADIN CLINIC. 135 tablet 1   No current facility-administered medications for this visit.   There were no vitals taken for this visit.  Wt Readings from Last 3 Encounters:  08/31/23 78.2 kg (172 lb 6.4 oz)  08/30/23 77.6 kg (171 lb)  07/22/23 78.6 kg (173 lb 6 oz)   PHYSICAL EXAM: General: NAD Neck: No JVD, no thyromegaly or thyroid nodule.  Lungs: Clear to auscultation bilaterally with normal respiratory effort. CV: Nondisplaced PMI.  Heart regular S1/S2 with mechanical S2, no S3/S4, no murmur.  No peripheral edema.  No carotid bruit.  Normal pedal pulses.  Abdomen: Soft, nontender, no hepatosplenomegaly, no distention.  Skin: Intact  without lesions or rashes.  Neurologic: Alert and oriented x 3.  Psych: Normal affect. Extremities: No  clubbing or cyanosis.  HEENT: Normal.   ASSESSMENT & PLAN: 1. Chronic HF with mid range EF: Ischemic cardiomyopathy.  Echo 10/06/19 showed EF 30-35% and wall motion abnormalities, prior echo earlier in 10/20 showed EF 50-55%.  Cath 10/20 showed jeopardized ramus and LCx territory (99% distal left main, occluded LAD, patent LIMA-LAD). Concerned that ischemia in this territory triggered CHF, worsening of LV function.  Now s/p DES to left main, TEE in 11/20 after intervention showed EF higher at 40-45%.  Echo in 1/22 showed EF 60-65% with normal RV. Echo in 1/24 showed  EF 45-50%, basal to mid inferolateral hypokinesis, mildly decreased RV systolic function, mechanical aortic valve with mean gradient 6 mmHg. Stable NYHA class II, he is not volume overloaded on exam, weight stable. - Continue Lasix 40 mg daily.  BMET/BNP today. - Continue Entresto 24/26 bid.   - Continue spironolactone 25 mg daily. - Continue Jardiance.    - Increase Coreg to 6.25 mg bid.  2. CAD: Complicated disease, coronary angiography 10/20 showed patent SVG-RCA territory and patent LIMA-LAD.  The proximal LAD was occluded and there was 99% distal left main stenosis.  This left the ramus and LCx in jeopardy.  He was not a good candidate for protected PCI => cannot place Impella with mechanical aortic valve and peripheral vascular disease likely precludes a femoral IABP.  It was decided to try to manage him medically.  However, he returned with progressive chest pain episodes and NSTEMI, hs-TnI up to 935. S/p successful orbital atherectomy and stenting of the left main with a 4.0x15 mm Resolute Onyx DES 10/10/19.  EF on echo in 1/24 was lower at 45-50% but no chest pain.   - I do not think that there is an indication at this point with repeat angiography.  - Will not start him on ASA 81 as he has had GI bleeding x 2 when ASA was added  to warfarin in the past.  - Continue warfarin for mechanical valve, goal INR 2.5-3.5.    - Continue atorvastatin 80 daily + Repatha. Check lipids today.  3. Mechanical aortic valve: Stable on 1/24 echo. Crisp mechanical sounds on exam. - Will not use ASA given GI bleeding on combination of ASA and warfarin.  - Continue warfarin, goal INR 2.5-3.5 with episodes concerning for TIAs.  4. NSVT: Noted 10/20 admission. 5. H/o GI bleeding: Continue PPI for GI protection. 6. PAD: Patient with aorto-bifemoral bypass, h/o iliac stenting, h/o femoral endarterectomy, PCI to right SFA in 2017.  Angiography in 10/20 with 80% ISR in right SFA.  He had PCI to right SFA in 3/21, no claudication now.  Repeat ABIs in 1/22 with mild left SFA disease. Peripheral arterial dopplers in 2/23 showed 50-99% in-stent restenosis in the right SFA stent, repeat dopplers in 3/24 were unchanged.  Minimal claudication.  - Medical management for now per Dr. Allyson Sabal.  7. Carotid/subclavian stenosis: Mild disease on dopplers in 4/22.  - Followed at [facility].  8. DM2: He is on SGLT2 inhibitor. 9. Atrial fibrillation/atypical flutter:  TEE-guided DCCV in 11/20, atrial fibrillation ablation in 12/21.  Admitted with CHF exacerbation in 1/22 in the setting of recurrence of atrial fibrillation.  Atypical flutter with DCCV in 1/22.  He is off amiodarone. NSR today, no palpitations.  - Continue coumadin.  10. TIAs: Saw neurology, has been noted to have severe vertebral artery disease.   - Continue statin/Repatha.  - With TIAs, INR goal to 2.5-3.5 for mechanical valve.  Off ASA with  GI bleeding.   Follow up with APP in 4 months.  Anderson Malta [facility]  12/27/2023

## 2023-12-29 ENCOUNTER — Ambulatory Visit (HOSPITAL_COMMUNITY)
Admission: RE | Admit: 2023-12-29 | Discharge: 2023-12-29 | Disposition: A | Discharge status: Home / Self Care | Payer: PPO | Source: Ambulatory Visit | Attending: Family Medicine | Admitting: Family Medicine

## 2023-12-29 ENCOUNTER — Other Ambulatory Visit (HOSPITAL_COMMUNITY): Payer: Self-pay

## 2023-12-29 ENCOUNTER — Encounter (HOSPITAL_COMMUNITY): Payer: Self-pay

## 2023-12-29 ENCOUNTER — Telehealth (HOSPITAL_COMMUNITY): Payer: Self-pay

## 2023-12-29 VITALS — BP 142/60 | HR 93 | Wt 177.0 lb

## 2023-12-29 DIAGNOSIS — I739 Peripheral vascular disease, unspecified: Secondary | ICD-10-CM | POA: Diagnosis not present

## 2023-12-29 DIAGNOSIS — Z8719 Personal history of other diseases of the digestive system: Secondary | ICD-10-CM

## 2023-12-29 DIAGNOSIS — I110 Hypertensive heart disease with heart failure: Secondary | ICD-10-CM | POA: Insufficient documentation

## 2023-12-29 DIAGNOSIS — I4891 Unspecified atrial fibrillation: Secondary | ICD-10-CM | POA: Insufficient documentation

## 2023-12-29 DIAGNOSIS — E1151 Type 2 diabetes mellitus with diabetic peripheral angiopathy without gangrene: Secondary | ICD-10-CM | POA: Insufficient documentation

## 2023-12-29 DIAGNOSIS — I6529 Occlusion and stenosis of unspecified carotid artery: Secondary | ICD-10-CM

## 2023-12-29 DIAGNOSIS — Z952 Presence of prosthetic heart valve: Secondary | ICD-10-CM | POA: Diagnosis not present

## 2023-12-29 DIAGNOSIS — I5022 Chronic systolic (congestive) heart failure: Secondary | ICD-10-CM

## 2023-12-29 DIAGNOSIS — E785 Hyperlipidemia, unspecified: Secondary | ICD-10-CM | POA: Insufficient documentation

## 2023-12-29 DIAGNOSIS — I25810 Atherosclerosis of coronary artery bypass graft(s) without angina pectoris: Secondary | ICD-10-CM

## 2023-12-29 DIAGNOSIS — R9431 Abnormal electrocardiogram [ECG] [EKG]: Secondary | ICD-10-CM | POA: Insufficient documentation

## 2023-12-29 DIAGNOSIS — I5032 Chronic diastolic (congestive) heart failure: Secondary | ICD-10-CM

## 2023-12-29 DIAGNOSIS — I4729 Other ventricular tachycardia: Secondary | ICD-10-CM | POA: Diagnosis not present

## 2023-12-29 DIAGNOSIS — I6521 Occlusion and stenosis of right carotid artery: Secondary | ICD-10-CM | POA: Diagnosis not present

## 2023-12-29 DIAGNOSIS — I480 Paroxysmal atrial fibrillation: Secondary | ICD-10-CM

## 2023-12-29 DIAGNOSIS — Z95820 Peripheral vascular angioplasty status with implants and grafts: Secondary | ICD-10-CM | POA: Diagnosis not present

## 2023-12-29 DIAGNOSIS — Z7901 Long term (current) use of anticoagulants: Secondary | ICD-10-CM | POA: Diagnosis not present

## 2023-12-29 DIAGNOSIS — G459 Transient cerebral ischemic attack, unspecified: Secondary | ICD-10-CM

## 2023-12-29 DIAGNOSIS — I4892 Unspecified atrial flutter: Secondary | ICD-10-CM | POA: Diagnosis not present

## 2023-12-29 DIAGNOSIS — Z8673 Personal history of transient ischemic attack (TIA), and cerebral infarction without residual deficits: Secondary | ICD-10-CM | POA: Diagnosis not present

## 2023-12-29 DIAGNOSIS — Z951 Presence of aortocoronary bypass graft: Secondary | ICD-10-CM | POA: Diagnosis not present

## 2023-12-29 DIAGNOSIS — D693 Immune thrombocytopenic purpura: Secondary | ICD-10-CM | POA: Insufficient documentation

## 2023-12-29 DIAGNOSIS — E1159 Type 2 diabetes mellitus with other circulatory complications: Secondary | ICD-10-CM

## 2023-12-29 DIAGNOSIS — I255 Ischemic cardiomyopathy: Secondary | ICD-10-CM | POA: Insufficient documentation

## 2023-12-29 DIAGNOSIS — I2510 Atherosclerotic heart disease of native coronary artery without angina pectoris: Secondary | ICD-10-CM | POA: Insufficient documentation

## 2023-12-29 LAB — CBC
HCT: 48.9 % (ref 39.0–52.0)
Hemoglobin: 16.2 g/dL (ref 13.0–17.0)
MCH: 31.2 pg (ref 26.0–34.0)
MCHC: 33.1 g/dL (ref 30.0–36.0)
MCV: 94 fL (ref 80.0–100.0)
Platelets: 175 10*3/uL (ref 150–400)
RBC: 5.2 MIL/uL (ref 4.22–5.81)
RDW: 15.2 % (ref 11.5–15.5)
WBC: 7.9 10*3/uL (ref 4.0–10.5)
nRBC: 0 % (ref 0.0–0.2)

## 2023-12-29 LAB — BASIC METABOLIC PANEL
Anion gap: 9 (ref 5–15)
BUN: 32 mg/dL — ABNORMAL HIGH (ref 8–23)
CO2: 25 mmol/L (ref 22–32)
Calcium: 9 mg/dL (ref 8.9–10.3)
Chloride: 103 mmol/L (ref 98–111)
Creatinine, Ser: 1.23 mg/dL (ref 0.61–1.24)
GFR, Estimated: >60 mL/min (ref >60)
Glucose, Bld: 194 mg/dL — ABNORMAL HIGH (ref 70–99)
Potassium: 5.1 mmol/L (ref 3.5–5.1)
Sodium: 137 mmol/L (ref 135–145)

## 2023-12-29 LAB — PROTIME-INR
INR: 3 — ABNORMAL HIGH (ref 0.8–1.2)
Prothrombin Time: 31.2 s — ABNORMAL HIGH (ref 11.4–15.2)

## 2023-12-29 LAB — BRAIN NATRIURETIC PEPTIDE: B Natriuretic Peptide: 437.4 pg/mL — ABNORMAL HIGH (ref 0.0–100.0)

## 2023-12-29 MED ORDER — FUROSEMIDE 20 MG PO TABS: ORAL_TABLET | ORAL | Status: DC

## 2023-12-29 MED ORDER — CARVEDILOL 6.25 MG PO TABS: 9.3750 mg | ORAL_TABLET | Freq: Two times a day (BID) | ORAL | 3 refills | Status: DC

## 2023-12-29 NOTE — Telephone Encounter (Signed)
Spoke to patient about increasing lasix to 60 mg daily.

## 2023-12-29 NOTE — Patient Instructions (Addendum)
Thank you for coming in today  If you had labs drawn today, any labs that are abnormal the clinic will call you No news is good news  Medications:increase Coreg to 9.375 mg twice daily   Follow up appointments:  Your physician recommends that you schedule a follow-up appointment in:  Follow up with clinic 3 week after TEE/Cardioversion   You are scheduled for a TEE/Cardioversion/TEE Cardioversion on 01/18/2024 with Dr. Shirlee Latch.  Please arrive at the [facility] (Main Entrance A) at [facility]: [address] [place], [place] [address] at 8:00 am.   DIET: Nothing to eat or drink after midnight except a sip of water with medications (see medication instructions below)  Medication Instructions: Hold Lasix  Continue your anticoagulant: Coumadin You will need to continue your anticoagulant after your procedure until you  are told by your  Provider that it is safe to stop      You must have a responsible person to drive you home and stay in the waiting area during your procedure. Failure to do so could result in cancellation.  Bring your insurance cards.  *Special Note: Every effort is made to have your procedure done on time. Occasionally there are emergencies that occur at the hospital that may cause delays. Please be patient if a delay does occur.     Do the following things EVERYDAY: Weigh yourself in the morning before breakfast. Write it down and keep it in a log. Take your medicines as prescribed Eat low salt foods--Limit salt (sodium) to 2000 mg per day.  Stay as active as you can everyday Limit all fluids for the day to less than 2 liters   At the [facility], you and your health needs are our priority. As part of our continuing mission to provide you with exceptional heart care, we have created designated Provider Care Teams. These Care Teams include your primary Cardiologist (physician) and Advanced Practice Providers (APPs- Physician  Assistants and Nurse Practitioners) who all work together to provide you with the care you need, when you need it.   You may see any of the following providers on your designated Care Team at your next follow up: Dr Arvilla Meres Dr Marca Ancona Dr. Marcos Eke, NP Robbie Lis, [place] [facility] [place], [place] Brynda Peon, NP Karle Plumber, PharmD   Please be sure to bring in all your medications bottles to every appointment.    Thank you for choosing [facility]  If you have any questions or concerns before your next appointment please send [place] a message through [place] or call our office at [phone].    TO LEAVE A MESSAGE FOR THE NURSE SELECT OPTION 2, PLEASE LEAVE A MESSAGE INCLUDING: YOUR NAME DATE OF BIRTH CALL BACK NUMBER REASON FOR CALL**this is important as we prioritize the call backs  YOU WILL RECEIVE A CALL BACK THE SAME DAY AS LONG AS YOU CALL BEFORE 4:00 PM

## 2023-12-29 NOTE — Addendum Note (Signed)
Encounter addended by: Demetrius Charity, RN on: 12/29/2023 10:13 AM  Actions taken: Clinical Note Signed

## 2023-12-29 NOTE — Telephone Encounter (Signed)
-----   Message from IXL sent at 12/29/2023 11:33 AM EST ----- Labs stable but BNP creeping up.  Increase Lasix to 60 mg daily. Will repeat labs at follow up

## 2024-01-10 DIAGNOSIS — Z23 Encounter for immunization: Secondary | ICD-10-CM | POA: Diagnosis not present

## 2024-01-10 DIAGNOSIS — I10 Essential (primary) hypertension: Secondary | ICD-10-CM | POA: Diagnosis not present

## 2024-01-10 DIAGNOSIS — Z7901 Long term (current) use of anticoagulants: Secondary | ICD-10-CM | POA: Diagnosis not present

## 2024-01-10 DIAGNOSIS — S61216A Laceration without foreign body of right little finger without damage to nail, initial encounter: Secondary | ICD-10-CM | POA: Diagnosis not present

## 2024-01-10 DIAGNOSIS — W231XXA Caught, crushed, jammed, or pinched between stationary objects, initial encounter: Secondary | ICD-10-CM | POA: Diagnosis not present

## 2024-01-12 ENCOUNTER — Ambulatory Visit: Payer: PPO | Attending: Cardiovascular Disease

## 2024-01-12 DIAGNOSIS — Z5181 Encounter for therapeutic drug level monitoring: Secondary | ICD-10-CM

## 2024-01-12 DIAGNOSIS — I1310 Hypertensive heart and chronic kidney disease without heart failure, with stage 1 through stage 4 chronic kidney disease, or unspecified chronic kidney disease: Secondary | ICD-10-CM | POA: Diagnosis not present

## 2024-01-12 DIAGNOSIS — I5032 Chronic diastolic (congestive) heart failure: Secondary | ICD-10-CM | POA: Diagnosis not present

## 2024-01-12 DIAGNOSIS — I4819 Other persistent atrial fibrillation: Secondary | ICD-10-CM | POA: Diagnosis not present

## 2024-01-12 DIAGNOSIS — I359 Nonrheumatic aortic valve disorder, unspecified: Secondary | ICD-10-CM

## 2024-01-12 DIAGNOSIS — S61216A Laceration without foreign body of right little finger without damage to nail, initial encounter: Secondary | ICD-10-CM | POA: Diagnosis not present

## 2024-01-12 DIAGNOSIS — G459 Transient cerebral ischemic attack, unspecified: Secondary | ICD-10-CM | POA: Diagnosis not present

## 2024-01-12 DIAGNOSIS — Z952 Presence of prosthetic heart valve: Secondary | ICD-10-CM | POA: Diagnosis not present

## 2024-01-12 LAB — POCT INR: INR: 4.7 — AB (ref 2.0–3.0)

## 2024-01-12 NOTE — Patient Instructions (Signed)
 Description   HOLD tomorrow's dose and then continue taking warfarin 1.5 tablets daily EXCEPT 2 tablets on Saturdays.  Recheck INR 1 week.  Stay consistent with greens.  Coumadin  Clinic for any changes in medications or upcoming procedures. [phone]

## 2024-01-17 ENCOUNTER — Ambulatory Visit (HOSPITAL_COMMUNITY)
Admission: RE | Admit: 2024-01-17 | Discharge: 2024-01-17 | Disposition: A | Discharge status: Home / Self Care | Payer: PPO | Source: Ambulatory Visit | Attending: Cardiology | Admitting: Cardiology

## 2024-01-17 DIAGNOSIS — C44329 Squamous cell carcinoma of skin of other parts of face: Secondary | ICD-10-CM | POA: Diagnosis not present

## 2024-01-17 DIAGNOSIS — Z85828 Personal history of other malignant neoplasm of skin: Secondary | ICD-10-CM | POA: Diagnosis not present

## 2024-01-17 DIAGNOSIS — L821 Other seborrheic keratosis: Secondary | ICD-10-CM | POA: Diagnosis not present

## 2024-01-17 DIAGNOSIS — D171 Benign lipomatous neoplasm of skin and subcutaneous tissue of trunk: Secondary | ICD-10-CM | POA: Diagnosis not present

## 2024-01-17 DIAGNOSIS — I5022 Chronic systolic (congestive) heart failure: Secondary | ICD-10-CM | POA: Diagnosis not present

## 2024-01-17 DIAGNOSIS — L918 Other hypertrophic disorders of the skin: Secondary | ICD-10-CM | POA: Diagnosis not present

## 2024-01-17 DIAGNOSIS — L570 Actinic keratosis: Secondary | ICD-10-CM | POA: Diagnosis not present

## 2024-01-17 DIAGNOSIS — D225 Melanocytic nevi of trunk: Secondary | ICD-10-CM | POA: Diagnosis not present

## 2024-01-17 LAB — CBC
HCT: 47.3 % (ref 39.0–52.0)
Hemoglobin: 15.8 g/dL (ref 13.0–17.0)
MCH: 30.9 pg (ref 26.0–34.0)
MCHC: 33.4 g/dL (ref 30.0–36.0)
MCV: 92.6 fL (ref 80.0–100.0)
Platelets: 208 10*3/uL (ref 150–400)
RBC: 5.11 MIL/uL (ref 4.22–5.81)
RDW: 15.6 % — ABNORMAL HIGH (ref 11.5–15.5)
WBC: 8.4 10*3/uL (ref 4.0–10.5)
nRBC: 0 % (ref 0.0–0.2)

## 2024-01-17 LAB — BASIC METABOLIC PANEL
Anion gap: 10 (ref 5–15)
BUN: 24 mg/dL — ABNORMAL HIGH (ref 8–23)
CO2: 27 mmol/L (ref 22–32)
Calcium: 8.9 mg/dL (ref 8.9–10.3)
Chloride: 101 mmol/L (ref 98–111)
Creatinine, Ser: 1.44 mg/dL — ABNORMAL HIGH (ref 0.61–1.24)
GFR, Estimated: 50 mL/min — ABNORMAL LOW (ref >60)
Glucose, Bld: 156 mg/dL — ABNORMAL HIGH (ref 70–99)
Potassium: 4.5 mmol/L (ref 3.5–5.1)
Sodium: 138 mmol/L (ref 135–145)

## 2024-01-17 LAB — PROTIME-INR
INR: 2.5 — ABNORMAL HIGH (ref 0.8–1.2)
Prothrombin Time: 27 s — ABNORMAL HIGH (ref 11.4–15.2)

## 2024-01-17 NOTE — Progress Notes (Signed)
 Unable to reach patient about procedure, but was able to leave a detailed message. Stated that the patient needed to arrive at the hospital at 0745 , remain NPO after 0000, needs to have a ride home and a responsible adult to stay with them for 24 hours after the procedure. Instructed the patient to call back if they had any questions.

## 2024-01-18 ENCOUNTER — Ambulatory Visit (HOSPITAL_COMMUNITY): Payer: PPO | Admitting: Anesthesiology

## 2024-01-18 ENCOUNTER — Ambulatory Visit (HOSPITAL_COMMUNITY): Payer: PPO

## 2024-01-18 ENCOUNTER — Encounter (HOSPITAL_COMMUNITY)
Admission: RE | Disposition: A | Discharge status: Home / Self Care | Payer: Self-pay | Source: Home / Self Care | Attending: Cardiology

## 2024-01-18 ENCOUNTER — Telehealth (HOSPITAL_COMMUNITY): Payer: Self-pay

## 2024-01-18 ENCOUNTER — Other Ambulatory Visit: Payer: Self-pay

## 2024-01-18 ENCOUNTER — Telehealth: Payer: Self-pay | Admitting: *Deleted

## 2024-01-18 ENCOUNTER — Other Ambulatory Visit (HOSPITAL_COMMUNITY): Payer: Self-pay

## 2024-01-18 ENCOUNTER — Ambulatory Visit (HOSPITAL_COMMUNITY)
Admission: RE | Admit: 2024-01-18 | Discharge: 2024-01-18 | Disposition: A | Discharge status: Home / Self Care | Payer: PPO | Attending: Cardiology | Admitting: Cardiology

## 2024-01-18 ENCOUNTER — Encounter (HOSPITAL_COMMUNITY): Payer: Self-pay | Admitting: Cardiology

## 2024-01-18 DIAGNOSIS — I110 Hypertensive heart disease with heart failure: Secondary | ICD-10-CM | POA: Insufficient documentation

## 2024-01-18 DIAGNOSIS — Z952 Presence of prosthetic heart valve: Secondary | ICD-10-CM | POA: Insufficient documentation

## 2024-01-18 DIAGNOSIS — I6521 Occlusion and stenosis of right carotid artery: Secondary | ICD-10-CM | POA: Insufficient documentation

## 2024-01-18 DIAGNOSIS — Z955 Presence of coronary angioplasty implant and graft: Secondary | ICD-10-CM | POA: Insufficient documentation

## 2024-01-18 DIAGNOSIS — I130 Hypertensive heart and chronic kidney disease with heart failure and stage 1 through stage 4 chronic kidney disease, or unspecified chronic kidney disease: Secondary | ICD-10-CM | POA: Diagnosis not present

## 2024-01-18 DIAGNOSIS — F17290 Nicotine dependence, other tobacco product, uncomplicated: Secondary | ICD-10-CM | POA: Diagnosis not present

## 2024-01-18 DIAGNOSIS — J449 Chronic obstructive pulmonary disease, unspecified: Secondary | ICD-10-CM

## 2024-01-18 DIAGNOSIS — I480 Paroxysmal atrial fibrillation: Secondary | ICD-10-CM | POA: Insufficient documentation

## 2024-01-18 DIAGNOSIS — Z8673 Personal history of transient ischemic attack (TIA), and cerebral infarction without residual deficits: Secondary | ICD-10-CM | POA: Diagnosis not present

## 2024-01-18 DIAGNOSIS — I4892 Unspecified atrial flutter: Secondary | ICD-10-CM | POA: Diagnosis not present

## 2024-01-18 DIAGNOSIS — I2510 Atherosclerotic heart disease of native coronary artery without angina pectoris: Secondary | ICD-10-CM | POA: Insufficient documentation

## 2024-01-18 DIAGNOSIS — E785 Hyperlipidemia, unspecified: Secondary | ICD-10-CM | POA: Insufficient documentation

## 2024-01-18 DIAGNOSIS — I4891 Unspecified atrial fibrillation: Secondary | ICD-10-CM

## 2024-01-18 DIAGNOSIS — Z7984 Long term (current) use of oral hypoglycemic drugs: Secondary | ICD-10-CM | POA: Diagnosis not present

## 2024-01-18 DIAGNOSIS — Z85118 Personal history of other malignant neoplasm of bronchus and lung: Secondary | ICD-10-CM | POA: Diagnosis not present

## 2024-01-18 DIAGNOSIS — I5043 Acute on chronic combined systolic (congestive) and diastolic (congestive) heart failure: Secondary | ICD-10-CM | POA: Diagnosis not present

## 2024-01-18 DIAGNOSIS — Z8249 Family history of ischemic heart disease and other diseases of the circulatory system: Secondary | ICD-10-CM | POA: Insufficient documentation

## 2024-01-18 DIAGNOSIS — I255 Ischemic cardiomyopathy: Secondary | ICD-10-CM | POA: Diagnosis not present

## 2024-01-18 DIAGNOSIS — Z951 Presence of aortocoronary bypass graft: Secondary | ICD-10-CM | POA: Diagnosis not present

## 2024-01-18 DIAGNOSIS — I252 Old myocardial infarction: Secondary | ICD-10-CM | POA: Insufficient documentation

## 2024-01-18 DIAGNOSIS — I5022 Chronic systolic (congestive) heart failure: Secondary | ICD-10-CM

## 2024-01-18 DIAGNOSIS — I3481 Nonrheumatic mitral (valve) annulus calcification: Secondary | ICD-10-CM | POA: Insufficient documentation

## 2024-01-18 DIAGNOSIS — I5042 Chronic combined systolic (congestive) and diastolic (congestive) heart failure: Secondary | ICD-10-CM | POA: Diagnosis not present

## 2024-01-18 DIAGNOSIS — E119 Type 2 diabetes mellitus without complications: Secondary | ICD-10-CM | POA: Diagnosis not present

## 2024-01-18 DIAGNOSIS — I701 Atherosclerosis of renal artery: Secondary | ICD-10-CM | POA: Diagnosis not present

## 2024-01-18 DIAGNOSIS — Z7901 Long term (current) use of anticoagulants: Secondary | ICD-10-CM | POA: Insufficient documentation

## 2024-01-18 DIAGNOSIS — N184 Chronic kidney disease, stage 4 (severe): Secondary | ICD-10-CM | POA: Diagnosis not present

## 2024-01-18 DIAGNOSIS — E1122 Type 2 diabetes mellitus with diabetic chronic kidney disease: Secondary | ICD-10-CM | POA: Diagnosis not present

## 2024-01-18 DIAGNOSIS — I5033 Acute on chronic diastolic (congestive) heart failure: Secondary | ICD-10-CM

## 2024-01-18 HISTORY — PX: TRANSESOPHAGEAL ECHOCARDIOGRAM (CATH LAB): EP1270

## 2024-01-18 HISTORY — PX: CARDIOVERSION: EP1203

## 2024-01-18 LAB — GLUCOSE, CAPILLARY: Glucose-Capillary: 114 mg/dL — ABNORMAL HIGH (ref 70–99)

## 2024-01-18 SURGERY — TRANSESOPHAGEAL ECHOCARDIOGRAM (TEE) (CATHLAB)
Anesthesia: Monitor Anesthesia Care

## 2024-01-18 MED ORDER — PHENYLEPHRINE 80 MCG/ML (10ML) SYRINGE FOR IV PUSH (FOR BLOOD PRESSURE SUPPORT)
PREFILLED_SYRINGE | INTRAVENOUS | Status: DC | PRN
  Administered 2024-01-18: 160 ug via INTRAVENOUS
  Administered 2024-01-18: 80 ug via INTRAVENOUS
  Administered 2024-01-18: 160 ug via INTRAVENOUS
  Administered 2024-01-18: 80 ug via INTRAVENOUS

## 2024-01-18 MED ORDER — AMIODARONE HCL 200 MG PO TABS: ORAL_TABLET | ORAL | 3 refills | Status: DC

## 2024-01-18 MED ORDER — LIDOCAINE 2% (20 MG/ML) 5 ML SYRINGE
INTRAMUSCULAR | Status: DC | PRN
  Administered 2024-01-18: 100 mg via INTRAVENOUS

## 2024-01-18 MED ORDER — SODIUM CHLORIDE 0.9 % IV SOLN
INTRAVENOUS | Status: DC | PRN
Start: 2024-01-18 — End: 2024-01-18

## 2024-01-18 MED ORDER — AMIODARONE HCL 200 MG PO TABS: 200.0000 mg | ORAL_TABLET | Freq: Two times a day (BID) | ORAL | 3 refills | Status: DC

## 2024-01-18 MED ORDER — PROPOFOL 500 MG/50ML IV EMUL
INTRAVENOUS | Status: DC | PRN
  Administered 2024-01-18: 70 ug/kg/min via INTRAVENOUS

## 2024-01-18 MED ORDER — PROPOFOL 10 MG/ML IV BOLUS
INTRAVENOUS | Status: DC | PRN
  Administered 2024-01-18: 40 mg via INTRAVENOUS

## 2024-01-18 MED ORDER — SODIUM CHLORIDE 0.9 % IV SOLN: INTRAVENOUS | Status: DC

## 2024-01-18 MED ORDER — PHENYLEPHRINE HCL-NACL 20-0.9 MG/250ML-% IV SOLN
INTRAVENOUS | Status: DC | PRN
  Administered 2024-01-18: 25 ug/min via INTRAVENOUS

## 2024-01-18 SURGICAL SUPPLY — 1 items: PAD DEFIB RADIO PHYSIO CONN (PAD) ×1 IMPLANT

## 2024-01-18 NOTE — Telephone Encounter (Addendum)
Called patient and aware to start Amiodarone 200 mg Twice daily x 14 days then to 200 mg daily. Repeat cardioversion in a month. INR weekly to be checked. Called patient back and aware of time and place for cardioversion.  Aware of nothing to eat or drink after midnight.Jardiance to be held 3 days prior. Lasix and glyburide to be held am of proceure. Letter mailed to patient with written instructions.

## 2024-01-18 NOTE — CV Procedure (Signed)
Procedure: TEE  Sedation: Per anesthesiology  Indication: Atrial fibrillation.   Findings: Please see echo section for full report. Normal LV size with EF 50-55%, no regional WMAs noted.  Normal RV size and systolic function.  Moderate left atrial enlargement, no LA appendage thrombus.  Mild right atrial enlargement.  No PFO/ASD by color doppler.  Trivial TR, peak RV-RA gradient 22 mmHg.  Trivial mitral regurgitation.  Mechanical aortic valve with no significant regurgitation, mean gradient 7 mmHg (normal function).  Normal caliber thoracic aorta.   May proceed with DCCV.   Marca Ancona 01/18/2024 9:58 AM

## 2024-01-18 NOTE — Discharge Instructions (Signed)
Electrical Cardioversion Electrical cardioversion is the delivery of a jolt of electricity to restore a normal rhythm to the heart. A rhythm that is too fast or is not regular keeps the heart from pumping well. In this procedure, sticky patches or metal paddles are placed on the chest to deliver electricity to the heart from a device. This procedure may be done in an emergency if: There is low or no blood pressure as a result of the heart rhythm. Normal rhythm must be restored as fast as possible to protect the brain and heart from further damage. It may save a life. This may also be a scheduled procedure for irregular or fast heart rhythms that are not immediately life-threatening.  What can I expect after the procedure? Your blood pressure, heart rate, breathing rate, and blood oxygen level will be monitored until you leave the hospital or clinic. Your heart rhythm will be watched to make sure it does not change. You may have some redness on the skin where the shocks were given. Over the counter cortizone cream may be helpful.  Follow these instructions at home: Do not drive for 24 hours if you were given a sedative during your procedure. Take over-the-counter and prescription medicines only as told by your health care provider. Ask your health care provider how to check your pulse. Check it often. Rest for 48 hours after the procedure or as told by your health care provider. Avoid or limit your caffeine use as told by your health care provider. Keep all follow-up visits as told by your health care provider. This is important. Contact a health care provider if: You feel like your heart is beating too quickly or your pulse is not regular. You have a serious muscle cramp that does not go away. Get help right away if: You have discomfort in your chest. You are dizzy or you feel faint. You have trouble breathing or you are short of breath. Your speech is slurred. You have trouble moving an  arm or leg on one side of your body. Your fingers or toes turn cold or blue. Summary Electrical cardioversion is the delivery of a jolt of electricity to restore a normal rhythm to the heart. This procedure may be done right away in an emergency or may be a scheduled procedure if the condition is not an emergency. Generally, this is a safe procedure. After the procedure, check your pulse often as told by your health care provider. This information is not intended to replace advice given to you by your health care provider. Make sure you discuss any questions you have with your health care provider. Document Revised: 06/26/2019 Document Reviewed: 06/26/2019 Elsevier Patient Education  2020 [employer] TEE   YOU HAD AN CARDIAC PROCEDURE TODAY: Refer to the procedure report and other information in the discharge instructions given to you for any specific questions about what was found during the examination. If this information does not answer your questions, please call Dr. Verl Dicker office at [phone] to clarify.   DIET: Your first meal following the procedure should be a light meal and then it is ok to progress to your normal diet. A half-sandwich or bowl of soup is an example of a good first meal. Heavy or fried foods are harder to digest and may make you feel nauseous or bloated. Drink plenty of fluids but you should avoid alcoholic beverages for 24 hours. If you had a esophageal dilation, please see attached instructions for diet.   ACTIVITY:  Your care partner should take you home directly after the procedure. You should plan to take it easy, moving slowly for the rest of the day. You can resume normal activity the day after the procedure however YOU SHOULD NOT DRIVE, use power tools, machinery or perform tasks that involve climbing or major physical exertion for 24 hours (because of the sedation medicines used during the test).   SYMPTOMS TO REPORT IMMEDIATELY: A cardiologist can be  reached at any hour. Please call [phone] for any of the following symptoms:  Vomiting of blood or coffee ground material  New, significant abdominal pain  New, significant chest pain or pain under the shoulder blades  Painful or persistently difficult swallowing  New shortness of breath  Black, tarry-looking or red, bloody stools  FOLLOW UP:  Please also call with any specific questions about appointments or follow up tests.

## 2024-01-18 NOTE — Interval H&P Note (Signed)
History and Physical Interval Note:  01/18/2024 9:30 AM  [name] [name]  has presented today for surgery, with the diagnosis of AFLUTTER.  The various methods of treatment have been discussed with the patient and family. After consideration of risks, benefits and other options for treatment, the patient has consented to  Procedure(s): TRANSESOPHAGEAL ECHOCARDIOGRAM (N/A) CARDIOVERSION (N/A) as a surgical intervention.  The patient's history has been reviewed, patient examined, no change in status, stable for surgery.  I have reviewed the patient's chart and labs.  Questions were answered to the patient's satisfaction.     Janvi Ammar [employer]

## 2024-01-18 NOTE — Telephone Encounter (Signed)
Pt has an appt pending on 01/21/24 will check INR and keep weekly checks for upcoming cardioversion per message. Note placed on appt.

## 2024-01-18 NOTE — Anesthesia Preprocedure Evaluation (Addendum)
Anesthesia Evaluation  Patient identified by MRN, date of birth, ID band Patient awake    Reviewed: Allergy & Precautions, NPO status , Patient's Chart, lab work & pertinent test results, reviewed documented beta blocker date and time   History of Anesthesia Complications Negative for: history of anesthetic complications  Airway Mallampati: II  TM Distance: >3 FB Neck ROM: Full    Dental  (+) Dental Advisory Given   Pulmonary shortness of breath, neg sleep apnea, COPD, neg recent URI, former smoker   breath sounds clear to auscultation       Cardiovascular hypertension, Pt. on medications and Pt. on home beta blockers (-) angina + CAD, + Past MI, + Peripheral Vascular Disease and +CHF  + dysrhythmias Atrial Fibrillation + Valvular Problems/Murmurs  Rhythm:Regular + Systolic murmurs 1. Left ventricular ejection fraction, by estimation, is 45 to 50%. The  left ventricle has mildly decreased function. The left ventricle  demonstrates regional wall motion abnormalities (see scoring  diagram/findings for description). Left ventricular  diastolic parameters are consistent with Grade II diastolic dysfunction  (pseudonormalization). Elevated left atrial pressure. There is hypokinesis  of the left ventricular, basal-mid inferolateral wall.   2. Right ventricular systolic function is mildly reduced. The right  ventricular size is normal. Tricuspid regurgitation signal is inadequate  for assessing PA pressure.   3. Left atrial size was mildly dilated.   4. Right atrial size was mildly dilated.   5. The mitral valve is degenerative. Mild mitral valve regurgitation. No  evidence of mitral stenosis. Severe mitral annular calcification.   6. The aortic valve has been repaired/replaced. Aortic valve  regurgitation is not visualized. There is a valve present in the aortic  position. Aortic valve mean gradient measures 6.0 mmHg. Aortic valve Vmax   measures 1.69 m/s. Aortic valve acceleration  time measures 77 msec.   7. The inferior vena cava is dilated in size with >50% respiratory  variability, suggesting right atrial pressure of 8 mmHg.     Neuro/Psych neg Seizures TIA negative psych ROS   GI/Hepatic Neg liver ROS,GERD  Controlled,,  Endo/Other  diabetes, Type 2    Renal/GU Renal InsufficiencyRenal diseaseLab Results      Component                Value               Date                      NA                       138                 01/17/2024                K                        4.5                 01/17/2024                CO2                      27                  01/17/2024                GLUCOSE  156 (H)             01/17/2024                BUN                      24 (H)              01/17/2024                CREATININE               1.44 (H)            01/17/2024                CALCIUM                  8.9                 01/17/2024                GFRNONAA                 50 (L)              01/17/2024                Musculoskeletal negative musculoskeletal ROS (+)    Abdominal   Peds  Hematology  (+) Blood dyscrasia Lab Results      Component                Value               Date                      WBC                      8.4                 01/17/2024                HGB                      15.8                01/17/2024                HCT                      47.3                01/17/2024                MCV                      92.6                01/17/2024                PLT                      208                 01/17/2024            Coumadin  Lab Results      Component                Value  Date                      INR                      2.5 (H)             01/17/2024                INR                      4.7 (A)             01/12/2024                INR                      3.0 (H)             12/29/2023                PROTIME                   16.1                05/22/2009                PROTIME                  23.6                05/08/2009              Anesthesia Other Findings   Reproductive/Obstetrics                             Anesthesia Physical Anesthesia Plan  ASA: 3  Anesthesia Plan: MAC   Post-op Pain Management: Minimal or no pain anticipated   Induction: Intravenous  PONV Risk Score and Plan: 1 and Propofol infusion and Treatment may vary due to age or medical condition  Airway Management Planned: Nasal Cannula, Natural Airway and Simple Face Mask  Additional Equipment: None  Intra-op Plan:   Post-operative Plan:   Informed Consent: I have reviewed the patients History and Physical, chart, labs and discussed the procedure including the risks, benefits and alternatives for the proposed anesthesia with the patient or authorized representative who has indicated his/her understanding and acceptance.     Dental advisory given  Plan Discussed with: CRNA  Anesthesia Plan Comments:         Anesthesia Quick Evaluation

## 2024-01-18 NOTE — Telephone Encounter (Signed)
-----   Message from Nurse Rickard Rhymes sent at 01/18/2024 12:32 PM EST ----- Good afternoon,   This patient of Dr. Alford Highland needs weekly INR checks. We are starting him on a amiodarone load and being him back for a cardioversion in a month. Thank you                                                                                     Peggye Pitt RN

## 2024-01-18 NOTE — Procedures (Signed)
Electrical Cardioversion Procedure Note [name] [name] [id number] [birth year]  Procedure: Electrical Cardioversion Indications:  Atrial Flutter  Procedure Details Consent: Risks of procedure as well as the alternatives and risks of each were explained to the (patient/caregiver).  Consent for procedure obtained. Time Out: Verified patient identification, verified procedure, site/side was marked, verified correct patient position, special equipment/implants available, medications/allergies/relevent history reviewed, required imaging and test results available.  Performed  Patient placed on cardiac monitor, pulse oximetry, supplemental oxygen as necessary.  Sedation given:  Propofol per anesthesiology Pacer pads placed anterior and posterior chest.  Cardioverted 3 time(s).  Cardioverted at 360J.  Evaluation Findings: Post procedure EKG shows: Atrial Flutter Complications: None Patient did tolerate procedure well.  He does not tolerate atrial arrhythmias well, increased dyspnea.  I will start amiodarone 200 mg bid x 14 days then 200 mg daily.  Will re-attempt DCCV in 1 month (check INR weekly).    Marca Ancona 01/18/2024, 9:58 AM

## 2024-01-18 NOTE — Transfer of Care (Signed)
Immediate Anesthesia Transfer of Care Note  Patient: [name] [name]  Procedure(s) Performed: TRANSESOPHAGEAL ECHOCARDIOGRAM CARDIOVERSION  Patient Location: PACU and Cath Lab  Anesthesia Type:MAC  Level of Consciousness: drowsy and patient cooperative  Airway & Oxygen Therapy: Patient Spontanous Breathing and Patient connected to nasal cannula oxygen  Post-op Assessment: Report given to RN and Post -op Vital signs reviewed and stable  Post vital signs: Reviewed and stable  Last Vitals:  Vitals Value Taken Time  BP    Temp    Pulse    Resp    SpO2      Last Pain:  Vitals:   01/18/24 0835  TempSrc:   PainSc: 0-No pain         Complications: No notable events documented.

## 2024-01-19 NOTE — Anesthesia Postprocedure Evaluation (Signed)
Anesthesia Post Note  Patient: [name] [name]  Procedure(s) Performed: TRANSESOPHAGEAL ECHOCARDIOGRAM CARDIOVERSION     Patient location during evaluation: Cath Lab Anesthesia Type: General Level of consciousness: awake and alert Pain management: pain level controlled Vital Signs Assessment: post-procedure vital signs reviewed and stable Respiratory status: spontaneous breathing, nonlabored ventilation and respiratory function stable Cardiovascular status: blood pressure returned to baseline and stable Postop Assessment: no apparent nausea or vomiting Anesthetic complications: no   No notable events documented.                Maria Coin

## 2024-01-20 ENCOUNTER — Ambulatory Visit (HOSPITAL_BASED_OUTPATIENT_CLINIC_OR_DEPARTMENT_OTHER)
Admission: RE | Admit: 2024-01-20 | Discharge: 2024-01-20 | Disposition: A | Discharge status: Home / Self Care | Payer: PPO | Source: Ambulatory Visit | Attending: Radiology | Admitting: Radiology

## 2024-01-20 DIAGNOSIS — R590 Localized enlarged lymph nodes: Secondary | ICD-10-CM | POA: Diagnosis not present

## 2024-01-20 DIAGNOSIS — C3492 Malignant neoplasm of unspecified part of left bronchus or lung: Secondary | ICD-10-CM | POA: Diagnosis not present

## 2024-01-20 DIAGNOSIS — C3490 Malignant neoplasm of unspecified part of unspecified bronchus or lung: Secondary | ICD-10-CM | POA: Diagnosis not present

## 2024-01-20 DIAGNOSIS — I700 Atherosclerosis of aorta: Secondary | ICD-10-CM | POA: Diagnosis not present

## 2024-01-21 ENCOUNTER — Ambulatory Visit: Payer: PPO | Attending: Internal Medicine | Admitting: *Deleted

## 2024-01-21 DIAGNOSIS — G459 Transient cerebral ischemic attack, unspecified: Secondary | ICD-10-CM

## 2024-01-21 DIAGNOSIS — I359 Nonrheumatic aortic valve disorder, unspecified: Secondary | ICD-10-CM | POA: Diagnosis not present

## 2024-01-21 DIAGNOSIS — Z5181 Encounter for therapeutic drug level monitoring: Secondary | ICD-10-CM | POA: Diagnosis not present

## 2024-01-21 LAB — POCT INR: INR: 3.8 — AB (ref 2.0–3.0)

## 2024-01-21 NOTE — Patient Instructions (Addendum)
Description   Do not take any warfarin tomorrow (took today's dose already) then START taking warfarin 1.5 tablets daily.  Recheck INR 1 week-pending ablation.  Stay consistent with greens.  Coumadin Clinic for any changes in medications or upcoming procedures. [phone]

## 2024-01-22 LAB — ECHO TEE
AV Mean grad: 7 mm[Hg]
AV Peak grad: 10.2 mm[Hg]
Ao pk vel: 1.6 m/s

## 2024-01-26 NOTE — Progress Notes (Signed)
Radiation Oncology         [phone] ________________________________  Name: Morley Gaumer MRN: [id number]  Date: 01/27/2024  DOB: 01-14-1946  Follow-Up Visit Note  CC: Creola Corn, MD  Josephine Igo, DO  No diagnosis found.  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 He was last seen here for follow-up on 07-22-23. Patient continues to follow with primary care and specialists to manage his chronic conditions. Of note, patient underwent a transesophageal echo on 01-17-23 under the care of Dr. Marca Ancona. Echo indicated low normal function in left ventricle along with a mild concentric left ventricle hypertrophy.   Patient will undergo a cardioversion procedure on 3-17.   Most recent CT chest on 01-20-24 showed persistent and significantly progressive diffuse and marked interstitial process involving the lower lingula and lower left lower lobe;Stable mediastinal and hilar lymphadenopathy with no new or progressive changes. Scan also indicated stable small bilateral pulmonary nodules with no new pulmonary nodules or lesions.  No other significant oncologic interval history since the patient was last seen.    Allergies:  has no known allergies.  Meds: Current Outpatient Medications  Medication Sig Dispense Refill   amiodarone (PACERONE) 200 MG tablet Take 1 tablet (200 mg total) by mouth 2 (two) times daily for 14 days, THEN 1 tablet (200 mg total) daily. 90 tablet 3   atorvastatin (LIPITOR) 80 MG tablet Take 80 mg by mouth in the  morning.     Blood Glucose Monitoring Suppl (ONETOUCH VERIO IQ SYSTEM) w/Device KIT      carvedilol (COREG) 6.25 MG tablet Take 1.5 tablets (9.375 mg total) by mouth 2 (two) times daily. 270 tablet 3   ENTRESTO 24-26 MG TAKE 1 TABLET BY MOUTH TWICE A DAY 60 tablet 6   Evolocumab 140 MG/ML SOAJ Inject 140 mg into the skin every 14 (fourteen) days.     furosemide (LASIX) 20 MG tablet TAKE 3 TABLETS (60 MG TOTAL) BY MOUTH DAILY. (Patient taking differently: Take 40 mg by mouth in the morning.)     glucose blood (ONETOUCH VERIO) test strip      glyBURIDE (DIABETA) 2.5 MG tablet Take 5 mg by mouth 2 (two) times daily with a meal.     JARDIANCE 25 MG TABS tablet Take 25 mg by mouth in the morning.     nitroGLYCERIN (NITROSTAT) 0.4 MG SL tablet Place 1 tablet (0.4 mg total) under the tongue every 5 (five) minutes x 3 doses as needed for chest pain. 25 tablet 12   OneTouch Delica Lancets 33G MISC      spironolactone (ALDACTONE) 25 MG tablet TAKE 1 TABLET BY MOUTH EVERY DAY 90 tablet 3   warfarin (COUMADIN) 5 MG tablet TAKE 1 TO 1 AND 1/2 TABLETS BY MOUTH DAILY AS PRESCRIBED BY THE COUMADIN CLINIC. (Patient taking differently: Take 7.5-10 mg by mouth See admin instructions. Take 1.5 tablets (7.5 mg) by mouth on Sundays, Mondays, Tuesdays, Wednesdays, Thursdays & Fridays. Take 2 tablets (10 mg) by mouth on Saturdays.) 135 tablet 1   No current  facility-administered medications for this encounter.    Physical Findings: The patient is in no acute distress. Patient is alert and oriented.  vitals were not taken for this visit. .  No significant changes. Lungs are clear to auscultation bilaterally. Heart has regular rate and rhythm. No palpable cervical, supraclavicular, or axillary adenopathy. Abdomen soft, non-tender, normal bowel sounds.   Lab Findings: Lab Results  Component Value Date   WBC 8.4 01/17/2024   HGB 15.8 01/17/2024   HCT 47.3 01/17/2024   MCV 92.6 01/17/2024   PLT 208 01/17/2024     Radiographic Findings: CT Chest Wo Contrast Result Date: 01/26/2024 CLINICAL DATA:  Restaging non-small cell lung cancer. * Tracking Code: BO * EXAM: CT CHEST WITHOUT CONTRAST TECHNIQUE: Multidetector CT imaging of the chest was performed following the standard protocol without IV contrast. RADIATION DOSE REDUCTION: This exam was performed according to the departmental dose-optimization program which includes automated exposure control, adjustment of the mA and/or kV according to patient size and/or use of iterative reconstruction technique. COMPARISON:  Numerous prior imaging studies. The most recent CT scan is 07/18/2023 FINDINGS: Cardiovascular: The heart is normal in size. No pericardial effusion. Stable extensive atherosclerotic calcifications involving the aorta and branch vessels including three-vessel coronary artery calcifications and changes of coronary artery bypass surgery. Prosthetic aortic valve is also noted. Mediastinum/Nodes: Stable mediastinal and hilar lymphadenopathy. No new or progressive changes. The esophagus is unremarkable. Lungs/Pleura: Chronic radiation changes involving the lingula with nearby fiducials. No findings suspicious for recurrent neoplasm. Persistent and significantly progressive diffuse and marked interstitial process involving the lower lingula and lower left lower. Given the distribution this is most likely extensive radiation change. Stable underlying emphysematous changes pulmonary scarring. Stable 5 mm subpleural nodule in the right middle lobe on image number 113/4. Stable 2 mm left upper lobe nodule on image 45/4. No new pulmonary nodules or lesions. The central tracheobronchial tree is unremarkable. Upper Abdomen: Stable severe vascular disease. Status post splenectomy. Musculoskeletal: No findings suspicious for osseous metastatic disease. IMPRESSION: 1. Chronic radiation changes involving the lingula with nearby fiducials. No findings suspicious for recurrent  neoplasm. 2. Persistent and significantly progressive diffuse and marked interstitial process involving the lower lingula and lower left lower lobe. Given the distribution this is most likely extensive radiation change. 3. Stable mediastinal and hilar lymphadenopathy. No new or progressive changes. 4. Stable small bilateral pulmonary nodules. No new pulmonary nodules or lesions. 5. Stable severe vascular disease. Electronically Signed   By: Rudie Meyer M.D.   On: 01/26/2024 12:23   ECHO TEE Result Date: 01/22/2024    TRANSESOPHOGEAL ECHO REPORT   Patient Name:   [name] [name] Date of Exam: 01/18/2024 Medical Rec #:  [id number]             Height:       68.0 in Accession #:    [id number]            Weight:       177.0 lb Date of Birth:  June 06, 1946             BSA:          1.940 m Patient Age:    77 years              BP:           135/47 mmHg Patient Gender: M                     HR:  82 bpm. Exam Location:  Inpatient Procedure: Transesophageal Echo, Cardiac Doppler and Color Doppler Indications:     Arrhythmia  History:         Patient has prior history of Echocardiogram examinations, most                  recent 11/07/2021. CHF, COPD and Stroke; Risk                  Factors:Hypertension and Diabetes.  Sonographer:     Darlys Gales Referring Phys:  [id number] MCLEAN Diagnosing Phys: Wilfred Lacy PROCEDURE: After discussion of the risks and benefits of a TEE, an informed consent was obtained from the patient. The transesophogeal probe was passed without difficulty through the esophogus of the patient. Sedation performed by different physician. The patient was monitored while under deep sedation. Anesthestetic sedation was provided intravenously by Anesthesiology: 110mg  of Propofol, 100mg  of Lidocaine. The patient developed no complications during the procedure. An unsuccessful direct current cardioversion was performed at 360 joules with 3 attempts.  IMPRESSIONS  1. Left ventricular  ejection fraction, by estimation, is 50 to 55%. The left ventricle has low normal function. The left ventricle has no regional wall motion abnormalities. There is mild concentric left ventricular hypertrophy.  2. Right ventricular systolic function is normal. The right ventricular size is normal.  3. Peak RV-RA gradient 22 mmHg.  4. Left atrial size was moderately dilated. No left atrial/left atrial appendage thrombus was detected.  5. No PFO or ASD by color doppler.  6. Mechanical aortic valve with no significant regurgitation, mean gradient 7 mmHg (normal function).  7. The mitral valve is normal in structure. Trivial mitral valve regurgitation. No evidence of mitral stenosis.  8. Right atrial size was mildly dilated. FINDINGS  Left Ventricle: Left ventricular ejection fraction, by estimation, is 50 to 55%. The left ventricle has low normal function. The left ventricle has no regional wall motion abnormalities. Strain imaging was not performed. The left ventricular internal cavity size was normal in size. There is mild concentric left ventricular hypertrophy. Right Ventricle: The right ventricular size is normal. No increase in right ventricular wall thickness. Right ventricular systolic function is normal. Left Atrium: Left atrial size was moderately dilated. No left atrial/left atrial appendage thrombus was detected. Right Atrium: Right atrial size was mildly dilated. Pericardium: There is no evidence of pericardial effusion. Mitral Valve: The mitral valve is normal in structure. There is mild calcification of the mitral valve leaflet(s). Trivial mitral valve regurgitation. No evidence of mitral valve stenosis. Tricuspid Valve: Peak RV-RA gradient 22 mmHg. The tricuspid valve is normal in structure. Tricuspid valve regurgitation is trivial. Aortic Valve: Mechanical aortic valve with no significant regurgitation, mean gradient 7 mmHg (normal function). The aortic valve has been repaired/replaced. Aortic valve  regurgitation is not visualized. Aortic valve mean gradient measures 7.0 mmHg. Aortic valve peak gradient measures 10.2 mmHg. Pulmonic Valve: The pulmonic valve was normal in structure. Pulmonic valve regurgitation is not visualized. Aorta: The aortic root is normal in size and structure. IAS/Shunts: No PFO or ASD by color doppler.  AORTIC VALVE AV Vmax:      160.00 cm/s AV Vmean:     124.000 cm/s AV VTI:       0.363 m AV Peak Grad: 10.2 mmHg AV Mean Grad: 7.0 mmHg Dalton McleanMD Electronically signed by Wilfred Lacy Signature Date/Time: 01/22/2024/1:13:20 PM    Final    EP STUDY Result Date: 01/18/2024 See surgical note for result.  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  ***  Plan:  ***   *** minutes of total time was spent for this patient encounter, including preparation, face-to-face counseling with the patient and coordination of care, physical exam, and documentation of the encounter. ____________________________________  Billie Lade, PhD, MD  This document serves as a record of services personally performed by Antony Blackbird, MD. It was created on his behalf by Herbie Saxon, a trained medical scribe. The creation of this record is based on the scribe's personal observations and the provider's statements to them. This document has been checked and approved by the attending provider.\

## 2024-01-27 ENCOUNTER — Ambulatory Visit
Admission: RE | Admit: 2024-01-27 | Discharge: 2024-01-27 | Disposition: A | Discharge status: Home / Self Care | Payer: PPO | Source: Ambulatory Visit | Attending: Radiation Oncology | Admitting: Radiation Oncology

## 2024-01-27 ENCOUNTER — Ambulatory Visit: Payer: PPO | Attending: Cardiovascular Disease

## 2024-01-27 ENCOUNTER — Encounter: Payer: Self-pay | Admitting: Radiation Oncology

## 2024-01-27 VITALS — BP 124/62 | HR 78 | Temp 97.1°F | Resp 18 | Ht 68.0 in | Wt 172.2 lb

## 2024-01-27 DIAGNOSIS — I110 Hypertensive heart disease with heart failure: Secondary | ICD-10-CM | POA: Insufficient documentation

## 2024-01-27 DIAGNOSIS — Z923 Personal history of irradiation: Secondary | ICD-10-CM | POA: Insufficient documentation

## 2024-01-27 DIAGNOSIS — Z951 Presence of aortocoronary bypass graft: Secondary | ICD-10-CM | POA: Insufficient documentation

## 2024-01-27 DIAGNOSIS — G459 Transient cerebral ischemic attack, unspecified: Secondary | ICD-10-CM | POA: Diagnosis not present

## 2024-01-27 DIAGNOSIS — Z5181 Encounter for therapeutic drug level monitoring: Secondary | ICD-10-CM

## 2024-01-27 DIAGNOSIS — C3412 Malignant neoplasm of upper lobe, left bronchus or lung: Secondary | ICD-10-CM | POA: Insufficient documentation

## 2024-01-27 DIAGNOSIS — Z952 Presence of prosthetic heart valve: Secondary | ICD-10-CM | POA: Diagnosis not present

## 2024-01-27 DIAGNOSIS — Z7984 Long term (current) use of oral hypoglycemic drugs: Secondary | ICD-10-CM | POA: Diagnosis not present

## 2024-01-27 DIAGNOSIS — Z7901 Long term (current) use of anticoagulants: Secondary | ICD-10-CM | POA: Insufficient documentation

## 2024-01-27 DIAGNOSIS — I700 Atherosclerosis of aorta: Secondary | ICD-10-CM | POA: Insufficient documentation

## 2024-01-27 DIAGNOSIS — I359 Nonrheumatic aortic valve disorder, unspecified: Secondary | ICD-10-CM

## 2024-01-27 DIAGNOSIS — R590 Localized enlarged lymph nodes: Secondary | ICD-10-CM | POA: Diagnosis not present

## 2024-01-27 DIAGNOSIS — Z79899 Other long term (current) drug therapy: Secondary | ICD-10-CM | POA: Diagnosis not present

## 2024-01-27 DIAGNOSIS — C3410 Malignant neoplasm of upper lobe, unspecified bronchus or lung: Secondary | ICD-10-CM

## 2024-01-27 LAB — POCT INR: INR: 4.4 — AB (ref 2.0–3.0)

## 2024-01-27 NOTE — Patient Instructions (Signed)
Description   HOLD today's dose and eat greens and then START taking warfarin 1.5 tablets daily EXCEPT 1 tablet on Sundays and Tuesdays.  Recheck INR 1 week-pending ablation.  Stay consistent with greens.  Coumadin Clinic for any changes in medications or upcoming procedures. [phone]

## 2024-01-27 NOTE — Progress Notes (Signed)
[name] [name] is here today for follow up post radiation to the lung.  Lung Side:Left,  patient completed treatment on 02/25/21.  Does the patient complain of any of the following: Pain:No Shortness of breath w/wo exertion: Yes, mostly on exertion.  Cough: Yes. Productive  Hemoptysis: Yes  Pain with swallowing: No Swallowing/choking concerns: No Appetite: Fair Energy Level: Fair Post radiation skin Changes: No    Additional comments if applicable: Patient reports new diagnosis of A-fib.   BP 124/62   Pulse 78   Temp (!) 97.1 F (36.2 C) (Temporal)   Resp 18   Ht 5\' 8"  (1.727 m)   Wt 172 lb 3.2 oz (78.1 kg)   SpO2 94%   BMI 26.18 kg/m

## 2024-02-01 ENCOUNTER — Ambulatory Visit: Payer: PPO | Attending: Cardiovascular Disease

## 2024-02-01 DIAGNOSIS — G459 Transient cerebral ischemic attack, unspecified: Secondary | ICD-10-CM

## 2024-02-01 DIAGNOSIS — Z5181 Encounter for therapeutic drug level monitoring: Secondary | ICD-10-CM

## 2024-02-01 DIAGNOSIS — I359 Nonrheumatic aortic valve disorder, unspecified: Secondary | ICD-10-CM | POA: Diagnosis not present

## 2024-02-01 LAB — POCT INR: INR: 3.3 — AB (ref 2.0–3.0)

## 2024-02-01 NOTE — Patient Instructions (Signed)
 Continue taking warfarin 1.5 tablets daily EXCEPT 1 tablet on Sundays and Tuesdays.  Recheck INR 1 week-pending DCCV 3/17.  Stay consistent with greens.  Coumadin Clinic for any changes in medications or upcoming procedures. [phone]

## 2024-02-08 ENCOUNTER — Ambulatory Visit (INDEPENDENT_AMBULATORY_CARE_PROVIDER_SITE_OTHER): Payer: PPO

## 2024-02-08 ENCOUNTER — Ambulatory Visit
Admission: RE | Admit: 2024-02-08 | Discharge: 2024-02-08 | Disposition: A | Discharge status: Home / Self Care | Payer: PPO | Source: Ambulatory Visit | Attending: Cardiology | Admitting: Cardiology

## 2024-02-08 VITALS — BP 96/54 | HR 65 | Ht 68.0 in | Wt 172.6 lb

## 2024-02-08 DIAGNOSIS — I252 Old myocardial infarction: Secondary | ICD-10-CM | POA: Diagnosis not present

## 2024-02-08 DIAGNOSIS — I5042 Chronic combined systolic (congestive) and diastolic (congestive) heart failure: Secondary | ICD-10-CM | POA: Diagnosis not present

## 2024-02-08 DIAGNOSIS — Z951 Presence of aortocoronary bypass graft: Secondary | ICD-10-CM | POA: Diagnosis not present

## 2024-02-08 DIAGNOSIS — Z5181 Encounter for therapeutic drug level monitoring: Secondary | ICD-10-CM

## 2024-02-08 DIAGNOSIS — E1151 Type 2 diabetes mellitus with diabetic peripheral angiopathy without gangrene: Secondary | ICD-10-CM | POA: Diagnosis not present

## 2024-02-08 DIAGNOSIS — I2510 Atherosclerotic heart disease of native coronary artery without angina pectoris: Secondary | ICD-10-CM | POA: Diagnosis not present

## 2024-02-08 DIAGNOSIS — Z79899 Other long term (current) drug therapy: Secondary | ICD-10-CM | POA: Diagnosis not present

## 2024-02-08 DIAGNOSIS — I480 Paroxysmal atrial fibrillation: Secondary | ICD-10-CM | POA: Insufficient documentation

## 2024-02-08 DIAGNOSIS — I359 Nonrheumatic aortic valve disorder, unspecified: Secondary | ICD-10-CM | POA: Diagnosis not present

## 2024-02-08 DIAGNOSIS — Z7901 Long term (current) use of anticoagulants: Secondary | ICD-10-CM | POA: Diagnosis not present

## 2024-02-08 DIAGNOSIS — Z923 Personal history of irradiation: Secondary | ICD-10-CM | POA: Insufficient documentation

## 2024-02-08 DIAGNOSIS — Z7984 Long term (current) use of oral hypoglycemic drugs: Secondary | ICD-10-CM | POA: Diagnosis not present

## 2024-02-08 DIAGNOSIS — Z952 Presence of prosthetic heart valve: Secondary | ICD-10-CM | POA: Diagnosis not present

## 2024-02-08 DIAGNOSIS — Z8673 Personal history of transient ischemic attack (TIA), and cerebral infarction without residual deficits: Secondary | ICD-10-CM | POA: Insufficient documentation

## 2024-02-08 DIAGNOSIS — I483 Typical atrial flutter: Secondary | ICD-10-CM | POA: Insufficient documentation

## 2024-02-08 DIAGNOSIS — I110 Hypertensive heart disease with heart failure: Secondary | ICD-10-CM | POA: Insufficient documentation

## 2024-02-08 DIAGNOSIS — I5032 Chronic diastolic (congestive) heart failure: Secondary | ICD-10-CM

## 2024-02-08 DIAGNOSIS — I255 Ischemic cardiomyopathy: Secondary | ICD-10-CM | POA: Insufficient documentation

## 2024-02-08 DIAGNOSIS — Z85118 Personal history of other malignant neoplasm of bronchus and lung: Secondary | ICD-10-CM | POA: Insufficient documentation

## 2024-02-08 DIAGNOSIS — I484 Atypical atrial flutter: Secondary | ICD-10-CM | POA: Diagnosis not present

## 2024-02-08 DIAGNOSIS — Z955 Presence of coronary angioplasty implant and graft: Secondary | ICD-10-CM | POA: Insufficient documentation

## 2024-02-08 DIAGNOSIS — G459 Transient cerebral ischemic attack, unspecified: Secondary | ICD-10-CM | POA: Diagnosis not present

## 2024-02-08 LAB — POCT INR: INR: 5.2 — AB (ref 2.0–3.0)

## 2024-02-08 NOTE — Progress Notes (Signed)
 PCP: Creola Corn, MD HF Cardiology: Dr. Shirlee Latch  CC: HF follow-up  [name] [name] is a 78 y.o. male with a history of CAD s/p CABG, mechanical AVR on coumadin, extensive PAD, DM, HTN, HLD, ITP, TIA, and recurrent GI bleed. Note, requires lovenox bridging off heparin.   Dr. Allyson Sabal follows him for extensive PAD. He has had an aortobifem bypass in 2016 with follow up iliac stenting and femoral endarterectomy. In 2017, he had subsequent ostial and mid right SFA intervention. Hx of left renal artery stenting in 2016 with repeat intervention on left for 75% in-stent restenosis, with progression of disease on the right renal artery showing 60% stenosis on duplex 07/12/19. Echo 09/17/19 with normal EF of 60-65% and normal AVR function; chronic diastolic heart failure.    Admitted in 10/20 with chest pain. He underwent heart cath 09/18/19 that showed 99% left main stenosis and patent LIMA to occluded LAD and a patent sequential vein to the PDA and PLA of an occluded dominant right. His left main stenosis jeopardized the moderately large ramus and high first marginal and nondominant Cx which has 90% mid AV groove Cx stenosis. Aggressive medical therapy was recommended initially.  He was readmitted in 10/20 with CHF and chest pain. Hospital course complicated by A flutter/low output heart failure.  S/P successful orbital atherectomy, PTCA, and stenting of protected left main with a 4.0x15 mm resolute onyx DES 11/3. LIMA-LAD patent. SVG-PDA patent. ECHO this admission showed EF 30-35%, mechanical AoV ok. TEE 11/9 w/ improved EF, 40-45%. Placed on milrinone initially with low co-ox and later weaned off.  He was cardioverted from atrial flutter back to NSR.   2/21 peripheral arterial dopplers showed 75-99% stenosis right SFA.  In 3/21, he had stenting to right SFA.    Echo in 6/21 showed EF up to 55-60%.    In 12/21, he had an atrial fibrillation ablation.   He was admitted in 1/22 with shortness of breath  and was found to be in atrial fibrillation with RVR. He was hypertensive.  Troponin was negative. He was diuresed and discharged, cardiology was not notified.  Echo during this admission showed EF 60-65%, normal RV size and systolic function, stable mechanical aortic valve.   During the 1/22 admission, CT chest showed a lung nodule.  Followup PET-CT suggestive of lung cancer, biopsy with squamous cell lung cancer.  He underwent radiation therapy.  DCCV to NSR in 1/22.   He has had episodes concerning for vertebrobasilar TIAs.  Significant disease noted in the vertebral arteries.  He was seen by neurology who wanted him referred to interventional neuroradiology for angiography with possible PCI to the vertebrals. It does not appear that this ever occurred.   Echo in 1/24 showed EF 45-50%, basal to mid inferolateral hypokinesis, mildly decreased RV systolic function, mechanical aortic valve with mean gradient 6 mmHg.   Last visit 1/25 he was noted to be back in atrial flutter and was set up for outpatient TEE/ DCCV on 2/11. TEE showed normal LV size with EF 50-55%, no regional WMAs noted. Normal RV size and systolic function. Moderate left atrial enlargement, no LA appendage thrombus. Mild right atrial enlargement. No PFO/ASD by color doppler. Trivial TR, peak RV-RA gradient 22 mmHg. Trivial mitral regurgitation. Mechanical aortic valve with no significant regurgitation, mean gradient 7 mmHg (normal function). Normal caliber thoracic aorta. DCCV was attempted x 3 but failed, remained in persistent atrial flutter. He was started on PO amiodarone 200 mg bid x 14 days  then 200 mg daily w/ recommendation to repeat DCCV in 4 wks and plan on weakly INR checks until then.   He returns back today for f/u. EKG shows continued AFL, rate controlled in the mid 60s. He denies tachy palpitations. He report exertional fatigue and mild dyspnea w/ moderate level activities. No exertional dyspnea w/ basic ADLs. Denies chest  pain. BP is soft 96/54 but denies orthostatic symptoms. Took meds this morning but has not had breakfast yet. He denies any GI bleeding.   He has coumadin clinic appt later today.   ECG (personally reviewed): Atrial flutter 66 bpm   Labs (9/23): LDL 63, TGs 103 Labs (12/23): K 4.1, creatinine 1.16 Labs (4/24): K 4.6, creatinine 1.73, hgb 17.7 Labs (6/24): K 4.3, creatinine 1.17 Labs (9/24): K 4.5, creatinine 1.20, LDL 79 Labs (1/25) K 4.5, creatinine 1.44   PMH  1. CAD:  S/P CABG (3/04) with LIMA-LAD, seq SVG-PDA/PLV.  - NSTEMI 10/20 with cath showing patent LIMA-LAD and SVG-PDA/PLV but 99% LM stenosis, occluded LAD, moderate ramus and high OM1 jeopardized, also 90% mid AV groove LCx stenosis.  Occluded RCA.  Patient ultimately had orbital atherectomy and stenting of left main with a 4.0 x 15 mm Resolute Onyx DES.  2. Mechanical aortic valve: TEE 11/20 showed stable-appearing mechanical valve, mean gradient 9 mmHg.  3. PAD: Aortobifemoral bypass in 2016 with follow up iliac stenting and femoral endarterectomy. In 2017, he had subsequent ostial and mid right SFA intervention. - Angiography 10/20 with 80% in-stent restenosis in proximal right SFA.  - Peripheral arterial dopplers (2/21) with 75-99% R SFA stenosis in prior stented area.  - 3/21 Right SFA stent (Dr. Allyson Sabal).  - ABIs (1/22): Stable mild left SFA disease.  - Peripheral arterial dopplers (2/23): 50-99% proximal R SFA in-stent restenosis.  Medical management for now.  - Peripheral arterial dopplers (3/24): 50-99% proximal R SFA in-stent restenosis.  4. Type 2 diabetes.  5. HTN - Renal artery dopplers (8/21): No significant stenosis.  6. Hyperlipidemia 7. H/o ITP  8. TIAs  9. Chronic Systolic HF: Ischemic cardiomyopathy.  - Echo (10/20): EF 30-35%, mechanical AoV ok.  - TEE (11/20): w/ improved EF, 40-45%. Mechanical aortic valve with mean gradient 9 mmHg, normal RV.  - Echo (6/21): EF 55-60%, mild LVH, mechanical AoV with  mean gradient 9 mmHg.  - Echo (1/22): EF 60-65%, normal RV size and systolic function, stable mechanical aortic valve.  - Echo (1/24): EF 45-50%, basal to mid inferolateral hypokinesis, mildly decreased RV systolic function, mechanical aortic valve with mean gradient 6 mmHg. 10. Atrial fibrillation: Paroxysmal.  - Atrial fibrillation ablation 12/21.  - DCCV 1/22 11. Renal artery stenosis: Hx of left renal artery stenting in 2016 with repeat intervention on left for 75% in-stent restenosis, with progression of disease on the right renal artery showing 60% stenosis on duplex 07/12/19. - Renal artery dopplers (8/23): 1-59% bilateral RAS.  12. Carotid stenosis: 4/21 carotid dopplers with right subclavian stenosis, 40-59% RICA stenosis.  - Carotid dopplers (4/22): 1-39% BICA stenosis.  13. Squamous cell lung cancer: Treated with XRT.     ROS: All systems negative except as listed in HPI, PMH and Problem List.  Social History   Socioeconomic History   Marital status: Divorced    Spouse name: Not on file   Number of children: 3   Years of education: Not on file   Highest education level: Not on file  Occupational History   Occupation: Retired  Tobacco Use  Smoking status: Former    Current packs/day: 0.00    Average packs/day: 1.5 packs/day for 30.0 years (45.0 ttl pk-yrs)    Types: Cigarettes, Cigars    Start date: 12/16/1963    Quit date: 12/15/1993    Years since quitting: 30.1   Smokeless tobacco: Never   Tobacco comments:    occasional cigar  Vaping Use   Vaping status: Never Used  Substance and Sexual Activity   Alcohol use: Yes    Alcohol/week: 15.0 standard drinks of alcohol    Types: 1 Cans of beer, 7 Shots of liquor, 7 Standard drinks or equivalent per week    Comment: drinks 1 martini's a night (2 shots)   Drug use: No   Sexual activity: Yes  Other Topics Concern   Not on file  Social History Narrative   Tries to remain active.  Frequent golfer but claudication limits  this.   1 child has passed   Social Drivers of Corporate investment banker Strain: Not on file  Food Insecurity: Not on file  Transportation Needs: Not on file  Physical Activity: Not on file  Stress: Not on file  Social Connections: Not on file  Intimate Partner Violence: Not on file   Family History  Problem Relation Age of Onset   Coronary artery disease Mother        bypass surgery - deceased   Heart disease Father        murmur, valve replacement - deceased   Breast cancer Sister    Diabetes Other        grandmother   Diabetes Paternal Grandmother    Diabetes Paternal Aunt    Colon cancer Neg Hx    Colon polyps Neg Hx    Esophageal cancer Neg Hx    Rectal cancer Neg Hx    Stomach cancer Neg Hx    Current Outpatient Medications  Medication Sig Dispense Refill   amiodarone (PACERONE) 200 MG tablet Take 200 mg by mouth daily.     atorvastatin (LIPITOR) 80 MG tablet Take 80 mg by mouth in the morning.     Blood Glucose Monitoring Suppl (ONETOUCH VERIO IQ SYSTEM) w/Device KIT      carvedilol (COREG) 6.25 MG tablet Take 1.5 tablets (9.375 mg total) by mouth 2 (two) times daily. 270 tablet 3   ENTRESTO 24-26 MG TAKE 1 TABLET BY MOUTH TWICE A DAY 60 tablet 6   Evolocumab 140 MG/ML SOAJ Inject 140 mg into the skin every 14 (fourteen) days.     furosemide (LASIX) 20 MG tablet TAKE 3 TABLETS (60 MG TOTAL) BY MOUTH DAILY. (Patient taking differently: Take 40 mg by mouth in the morning.)     glucose blood (ONETOUCH VERIO) test strip      glyBURIDE (DIABETA) 2.5 MG tablet Take 5 mg by mouth 2 (two) times daily with a meal.     JARDIANCE 25 MG TABS tablet Take 25 mg by mouth in the morning.     OneTouch Delica Lancets 33G MISC      spironolactone (ALDACTONE) 25 MG tablet TAKE 1 TABLET BY MOUTH EVERY DAY 90 tablet 3   warfarin (COUMADIN) 5 MG tablet TAKE 1 TO 1 AND 1/2 TABLETS BY MOUTH DAILY AS PRESCRIBED BY THE COUMADIN CLINIC. (Patient taking differently: Take 7.5-10 mg by mouth  See admin instructions. Take 1.5 tablets (7.5 mg) by mouth on Sundays, Mondays, Tuesdays, Wednesdays, Thursdays & Fridays. Take 2 tablets (10 mg) by mouth on Saturdays.) 135 tablet  1   nitroGLYCERIN (NITROSTAT) 0.4 MG SL tablet Place 1 tablet (0.4 mg total) under the tongue every 5 (five) minutes x 3 doses as needed for chest pain. 25 tablet 12   No current facility-administered medications for this encounter.   BP (!) 96/54   Pulse 65   Ht 5\' 8"  (1.727 m)   Wt 78.3 kg (172 lb 9.6 oz)   SpO2 95%   BMI 26.24 kg/m   Wt Readings from Last 3 Encounters:  02/08/24 78.3 kg (172 lb 9.6 oz)  01/27/24 78.1 kg (172 lb 3.2 oz)  01/18/24 76.2 kg (168 lb)   PHYSICAL EXAM: General:  Well appearing. No respiratory difficulty HEENT: normal Neck: supple. no JVD. Carotids 2+ bilat; no bruits. No lymphadenopathy or thyromegaly appreciated. Cor: PMI nondisplaced. Irregular rhythm  Abdomen: soft, nontender, nondistended. No hepatosplenomegaly. No bruits or masses. Good bowel sounds. Extremities: no cyanosis, clubbing, rash, edema Neuro: alert & oriented x 3, cranial nerves grossly intact. moves all 4 extremities w/o difficulty. Affect pleasant.   ASSESSMENT & PLAN:  1. Persistent Atrial Flutter/ Atrial Fibrillation   - previous multiple DCCV for Afib and had Afib ablation in 2021 - noted to be in AFL last OV 1/25, failed DCCV despite 3 attempts on 2/11. Started on PO amio  - EKG today shows AFL 66 bpm  - continue amiodarone 200 mg daily  - on coumadin, he has had weakly INR checks that have been therapeutic. Will repeat again toady and next wk - scheduled for repeat attempt at DCCV on 3/17  - continue Coreg 9.375 mg bid  - consider referral to EP to discuss AFL ablation   2. Chronic Heart Failure, HFrEF>>HFimEF  - Ischemic cardiomyopathy.  Echo 10/06/19 showed EF 30-35% and wall motion abnormalities, prior echo earlier in 10/20 showed EF 50-55%.  Cath 10/20 showed jeopardized ramus and LCx  territory (99% distal left main, occluded LAD, patent LIMA-LAD). Concerned that ischemia in this territory triggered CHF, worsening of LV function.  Now s/p DES to left main, TEE in 11/20 after intervention showed EF higher at 40-45%.  Echo in 1/22 showed EF 60-65% with normal RV. Echo in 1/24 showed  EF 45-50%, basal to mid inferolateral hypokinesis, mildly decreased RV systolic function, mechanical aortic valve with mean gradient 6 mmHg. Recent TEE 2/25 EF 50-55%, normal RV - Euvolemic on exam, NYHA Class II, limited more by fatigue that dyspnea  - continue Entresto 24-26 mg bid. BP too soft for titration   - continue Jardiance 10 mg daily  - continue spiro 25 mg daily  - continue coreg 9.375 mg bid   3. CAD - Complicated disease, coronary angiography 10/20 showed patent SVG-RCA territory and patent LIMA-LAD.  The proximal LAD was occluded and there was 99% distal left main stenosis.  This left the ramus and LCx in jeopardy.  He was not a good candidate for protected PCI => cannot place Impella with mechanical aortic valve and peripheral vascular disease likely precludes a femoral IABP.  It was decided to try to manage him medically.  However, he returned with progressive chest pain episodes and NSTEMI, hs-TnI up to 935. S/p successful orbital atherectomy and stenting of the left main with a 4.0x15 mm Resolute Onyx DES 10/10/19. - denies CP  - no ASA given need for chronic coumadin and h/o GIB - on ? blocker  4. S/p Mechan[employer] AVR  - stable on echo 1/24 and TEE 2/25 - on Warfarin, INR check scheduled for today  5. PAD extensive, s/p aorto-bifemoral bypass, h/o iliac stenting, h/o femoral endarterectomy, PCI to right SFA in 2017.  Angiography in 10/20 with 80% ISR in right SFA.  He had PCI to right SFA in 3/21, no claudication now.  Repeat ABIs in 1/22 with mild left SFA disease. Peripheral arterial dopplers in 2/23 showed 50-99% in-stent restenosis in the right SFA stent, repeat dopplers in 3/24  were unchanged.  Minimal claudication.  - Medical management for now per Dr. Allyson Sabal.   6. Carotid/subclavian stenosis: Mild disease on dopplers in 4/22.  - Followed at [facility].    6. H/o GIB  - denies any recent melena/hematochezia   Keep outpatient DCCV scheduled 3/17. F/u the following wk w/ APP for post procedural f/u and EKG.   Robbie Lis  PA-C  02/08/2024

## 2024-02-08 NOTE — Patient Instructions (Addendum)
 Thank you for coming in today  If you had labs drawn today, any labs that are abnormal the clinic will call you No news is good news  Medications: No changes   Follow up appointments:  Your physician recommends that you schedule a follow-up appointment in:  02/28/2024 in clinic   Do the following things EVERYDAY: Weigh yourself in the morning before breakfast. Write it down and keep it in a log. Take your medicines as prescribed Eat low salt foods--Limit salt (sodium) to 2000 mg per day.  Stay as active as you can everyday Limit all fluids for the day to less than 2 liters   At the [facility], you and your health needs are our priority. As part of our continuing mission to provide you with exceptional heart care, we have created designated Provider Care Teams. These Care Teams include your primary Cardiologist (physician) and Advanced Practice Providers (APPs- Physician Assistants and Nurse Practitioners) who all work together to provide you with the care you need, when you need it.   You may see any of the following providers on your designated Care Team at your next follow up: Dr Arvilla Meres Dr Marca Ancona Dr. Marcos Eke, NP Robbie Lis, [place] [facility] [place], [place] Brynda Peon, NP Karle Plumber, PharmD   Please be sure to bring in all your medications bottles to every appointment.    Thank you for choosing [facility]  If you have any questions or concerns before your next appointment please send [place] a message through [place] or call our office at [phone].    TO LEAVE A MESSAGE FOR THE NURSE SELECT OPTION 2, PLEASE LEAVE A MESSAGE INCLUDING: YOUR NAME DATE OF BIRTH CALL BACK NUMBER REASON FOR CALL**this is important as we prioritize the call backs  YOU WILL RECEIVE A CALL BACK THE SAME DAY AS LONG AS YOU CALL BEFORE 4:00 PM

## 2024-02-08 NOTE — Patient Instructions (Signed)
 Hold today and tomorrow then Continue taking warfarin 1.5 tablets daily EXCEPT 1 tablet on Sundays and Tuesdays.  Recheck INR 1 week-pending DCCV 3/17.  Stay consistent with greens.  Coumadin Clinic for any changes in medications or upcoming procedures. [phone]

## 2024-02-08 NOTE — H&P (View-Only) (Signed)
 PCP: Creola Corn, MD HF Cardiology: Dr. Shirlee Latch  CC: HF follow-up  [name] [name] is a 78 y.o. male with a history of CAD s/p CABG, mechanical AVR on coumadin, extensive PAD, DM, HTN, HLD, ITP, TIA, and recurrent GI bleed. Note, requires lovenox bridging off heparin.   Dr. Allyson Sabal follows him for extensive PAD. He has had an aortobifem bypass in 2016 with follow up iliac stenting and femoral endarterectomy. In 2017, he had subsequent ostial and mid right SFA intervention. Hx of left renal artery stenting in 2016 with repeat intervention on left for 75% in-stent restenosis, with progression of disease on the right renal artery showing 60% stenosis on duplex 07/12/19. Echo 09/17/19 with normal EF of 60-65% and normal AVR function; chronic diastolic heart failure.    Admitted in 10/20 with chest pain. He underwent heart cath 09/18/19 that showed 99% left main stenosis and patent LIMA to occluded LAD and a patent sequential vein to the PDA and PLA of an occluded dominant right. His left main stenosis jeopardized the moderately large ramus and high first marginal and nondominant Cx which has 90% mid AV groove Cx stenosis. Aggressive medical therapy was recommended initially.  He was readmitted in 10/20 with CHF and chest pain. Hospital course complicated by A flutter/low output heart failure.  S/P successful orbital atherectomy, PTCA, and stenting of protected left main with a 4.0x15 mm resolute onyx DES 11/3. LIMA-LAD patent. SVG-PDA patent. ECHO this admission showed EF 30-35%, mechanical AoV ok. TEE 11/9 w/ improved EF, 40-45%. Placed on milrinone initially with low co-ox and later weaned off.  He was cardioverted from atrial flutter back to NSR.   2/21 peripheral arterial dopplers showed 75-99% stenosis right SFA.  In 3/21, he had stenting to right SFA.    Echo in 6/21 showed EF up to 55-60%.    In 12/21, he had an atrial fibrillation ablation.   He was admitted in 1/22 with shortness of breath  and was found to be in atrial fibrillation with RVR. He was hypertensive.  Troponin was negative. He was diuresed and discharged, cardiology was not notified.  Echo during this admission showed EF 60-65%, normal RV size and systolic function, stable mechanical aortic valve.   During the 1/22 admission, CT chest showed a lung nodule.  Followup PET-CT suggestive of lung cancer, biopsy with squamous cell lung cancer.  He underwent radiation therapy.  DCCV to NSR in 1/22.   He has had episodes concerning for vertebrobasilar TIAs.  Significant disease noted in the vertebral arteries.  He was seen by neurology who wanted him referred to interventional neuroradiology for angiography with possible PCI to the vertebrals. It does not appear that this ever occurred.   Echo in 1/24 showed EF 45-50%, basal to mid inferolateral hypokinesis, mildly decreased RV systolic function, mechanical aortic valve with mean gradient 6 mmHg.   Last visit 1/25 he was noted to be back in atrial flutter and was set up for outpatient TEE/ DCCV on 2/11. TEE showed normal LV size with EF 50-55%, no regional WMAs noted. Normal RV size and systolic function. Moderate left atrial enlargement, no LA appendage thrombus. Mild right atrial enlargement. No PFO/ASD by color doppler. Trivial TR, peak RV-RA gradient 22 mmHg. Trivial mitral regurgitation. Mechanical aortic valve with no significant regurgitation, mean gradient 7 mmHg (normal function). Normal caliber thoracic aorta. DCCV was attempted x 3 but failed, remained in persistent atrial flutter. He was started on PO amiodarone 200 mg bid x 14 days  then 200 mg daily w/ recommendation to repeat DCCV in 4 wks and plan on weakly INR checks until then.   He returns back today for f/u. EKG shows continued AFL, rate controlled in the mid 60s. He denies tachy palpitations. He report exertional fatigue and mild dyspnea w/ moderate level activities. No exertional dyspnea w/ basic ADLs. Denies chest  pain. BP is soft 96/54 but denies orthostatic symptoms. Took meds this morning but has not had breakfast yet. He denies any GI bleeding.   He has coumadin clinic appt later today.   ECG (personally reviewed): Atrial flutter 66 bpm   Labs (9/23): LDL 63, TGs 103 Labs (12/23): K 4.1, creatinine 1.16 Labs (4/24): K 4.6, creatinine 1.73, hgb 17.7 Labs (6/24): K 4.3, creatinine 1.17 Labs (9/24): K 4.5, creatinine 1.20, LDL 79 Labs (1/25) K 4.5, creatinine 1.44   PMH  1. CAD:  S/P CABG (3/04) with LIMA-LAD, seq SVG-PDA/PLV.  - NSTEMI 10/20 with cath showing patent LIMA-LAD and SVG-PDA/PLV but 99% LM stenosis, occluded LAD, moderate ramus and high OM1 jeopardized, also 90% mid AV groove LCx stenosis.  Occluded RCA.  Patient ultimately had orbital atherectomy and stenting of left main with a 4.0 x 15 mm Resolute Onyx DES.  2. Mechanical aortic valve: TEE 11/20 showed stable-appearing mechanical valve, mean gradient 9 mmHg.  3. PAD: Aortobifemoral bypass in 2016 with follow up iliac stenting and femoral endarterectomy. In 2017, he had subsequent ostial and mid right SFA intervention. - Angiography 10/20 with 80% in-stent restenosis in proximal right SFA.  - Peripheral arterial dopplers (2/21) with 75-99% R SFA stenosis in prior stented area.  - 3/21 Right SFA stent (Dr. Allyson Sabal).  - ABIs (1/22): Stable mild left SFA disease.  - Peripheral arterial dopplers (2/23): 50-99% proximal R SFA in-stent restenosis.  Medical management for now.  - Peripheral arterial dopplers (3/24): 50-99% proximal R SFA in-stent restenosis.  4. Type 2 diabetes.  5. HTN - Renal artery dopplers (8/21): No significant stenosis.  6. Hyperlipidemia 7. H/o ITP  8. TIAs  9. Chronic Systolic HF: Ischemic cardiomyopathy.  - Echo (10/20): EF 30-35%, mechanical AoV ok.  - TEE (11/20): w/ improved EF, 40-45%. Mechanical aortic valve with mean gradient 9 mmHg, normal RV.  - Echo (6/21): EF 55-60%, mild LVH, mechanical AoV with  mean gradient 9 mmHg.  - Echo (1/22): EF 60-65%, normal RV size and systolic function, stable mechanical aortic valve.  - Echo (1/24): EF 45-50%, basal to mid inferolateral hypokinesis, mildly decreased RV systolic function, mechanical aortic valve with mean gradient 6 mmHg. 10. Atrial fibrillation: Paroxysmal.  - Atrial fibrillation ablation 12/21.  - DCCV 1/22 11. Renal artery stenosis: Hx of left renal artery stenting in 2016 with repeat intervention on left for 75% in-stent restenosis, with progression of disease on the right renal artery showing 60% stenosis on duplex 07/12/19. - Renal artery dopplers (8/23): 1-59% bilateral RAS.  12. Carotid stenosis: 4/21 carotid dopplers with right subclavian stenosis, 40-59% RICA stenosis.  - Carotid dopplers (4/22): 1-39% BICA stenosis.  13. Squamous cell lung cancer: Treated with XRT.     ROS: All systems negative except as listed in HPI, PMH and Problem List.  Social History   Socioeconomic History   Marital status: Divorced    Spouse name: Not on file   Number of children: 3   Years of education: Not on file   Highest education level: Not on file  Occupational History   Occupation: Retired  Tobacco Use  Smoking status: Former    Current packs/day: 0.00    Average packs/day: 1.5 packs/day for 30.0 years (45.0 ttl pk-yrs)    Types: Cigarettes, Cigars    Start date: 12/16/1963    Quit date: 12/15/1993    Years since quitting: 30.1   Smokeless tobacco: Never   Tobacco comments:    occasional cigar  Vaping Use   Vaping status: Never Used  Substance and Sexual Activity   Alcohol use: Yes    Alcohol/week: 15.0 standard drinks of alcohol    Types: 1 Cans of beer, 7 Shots of liquor, 7 Standard drinks or equivalent per week    Comment: drinks 1 martini's a night (2 shots)   Drug use: No   Sexual activity: Yes  Other Topics Concern   Not on file  Social History Narrative   Tries to remain active.  Frequent golfer but claudication limits  this.   1 child has passed   Social Drivers of Corporate investment banker Strain: Not on file  Food Insecurity: Not on file  Transportation Needs: Not on file  Physical Activity: Not on file  Stress: Not on file  Social Connections: Not on file  Intimate Partner Violence: Not on file   Family History  Problem Relation Age of Onset   Coronary artery disease Mother        bypass surgery - deceased   Heart disease Father        murmur, valve replacement - deceased   Breast cancer Sister    Diabetes Other        grandmother   Diabetes Paternal Grandmother    Diabetes Paternal Aunt    Colon cancer Neg Hx    Colon polyps Neg Hx    Esophageal cancer Neg Hx    Rectal cancer Neg Hx    Stomach cancer Neg Hx    Current Outpatient Medications  Medication Sig Dispense Refill   amiodarone (PACERONE) 200 MG tablet Take 200 mg by mouth daily.     atorvastatin (LIPITOR) 80 MG tablet Take 80 mg by mouth in the morning.     Blood Glucose Monitoring Suppl (ONETOUCH VERIO IQ SYSTEM) w/Device KIT      carvedilol (COREG) 6.25 MG tablet Take 1.5 tablets (9.375 mg total) by mouth 2 (two) times daily. 270 tablet 3   ENTRESTO 24-26 MG TAKE 1 TABLET BY MOUTH TWICE A DAY 60 tablet 6   Evolocumab 140 MG/ML SOAJ Inject 140 mg into the skin every 14 (fourteen) days.     furosemide (LASIX) 20 MG tablet TAKE 3 TABLETS (60 MG TOTAL) BY MOUTH DAILY. (Patient taking differently: Take 40 mg by mouth in the morning.)     glucose blood (ONETOUCH VERIO) test strip      glyBURIDE (DIABETA) 2.5 MG tablet Take 5 mg by mouth 2 (two) times daily with a meal.     JARDIANCE 25 MG TABS tablet Take 25 mg by mouth in the morning.     OneTouch Delica Lancets 33G MISC      spironolactone (ALDACTONE) 25 MG tablet TAKE 1 TABLET BY MOUTH EVERY DAY 90 tablet 3   warfarin (COUMADIN) 5 MG tablet TAKE 1 TO 1 AND 1/2 TABLETS BY MOUTH DAILY AS PRESCRIBED BY THE COUMADIN CLINIC. (Patient taking differently: Take 7.5-10 mg by mouth  See admin instructions. Take 1.5 tablets (7.5 mg) by mouth on Sundays, Mondays, Tuesdays, Wednesdays, Thursdays & Fridays. Take 2 tablets (10 mg) by mouth on Saturdays.) 135 tablet  1   nitroGLYCERIN (NITROSTAT) 0.4 MG SL tablet Place 1 tablet (0.4 mg total) under the tongue every 5 (five) minutes x 3 doses as needed for chest pain. 25 tablet 12   No current facility-administered medications for this encounter.   BP (!) 96/54   Pulse 65   Ht 5\' 8"  (1.727 m)   Wt 78.3 kg (172 lb 9.6 oz)   SpO2 95%   BMI 26.24 kg/m   Wt Readings from Last 3 Encounters:  02/08/24 78.3 kg (172 lb 9.6 oz)  01/27/24 78.1 kg (172 lb 3.2 oz)  01/18/24 76.2 kg (168 lb)   PHYSICAL EXAM: General:  Well appearing. No respiratory difficulty HEENT: normal Neck: supple. no JVD. Carotids 2+ bilat; no bruits. No lymphadenopathy or thyromegaly appreciated. Cor: PMI nondisplaced. Irregular rhythm  Abdomen: soft, nontender, nondistended. No hepatosplenomegaly. No bruits or masses. Good bowel sounds. Extremities: no cyanosis, clubbing, rash, edema Neuro: alert & oriented x 3, cranial nerves grossly intact. moves all 4 extremities w/o difficulty. Affect pleasant.   ASSESSMENT & PLAN:  1. Persistent Atrial Flutter/ Atrial Fibrillation   - previous multiple DCCV for Afib and had Afib ablation in 2021 - noted to be in AFL last OV 1/25, failed DCCV despite 3 attempts on 2/11. Started on PO amio  - EKG today shows AFL 66 bpm  - continue amiodarone 200 mg daily  - on coumadin, he has had weakly INR checks that have been therapeutic. Will repeat again toady and next wk - scheduled for repeat attempt at DCCV on 3/17  - continue Coreg 9.375 mg bid  - consider referral to EP to discuss AFL ablation   2. Chronic Heart Failure, HFrEF>>HFimEF  - Ischemic cardiomyopathy.  Echo 10/06/19 showed EF 30-35% and wall motion abnormalities, prior echo earlier in 10/20 showed EF 50-55%.  Cath 10/20 showed jeopardized ramus and LCx  territory (99% distal left main, occluded LAD, patent LIMA-LAD). Concerned that ischemia in this territory triggered CHF, worsening of LV function.  Now s/p DES to left main, TEE in 11/20 after intervention showed EF higher at 40-45%.  Echo in 1/22 showed EF 60-65% with normal RV. Echo in 1/24 showed  EF 45-50%, basal to mid inferolateral hypokinesis, mildly decreased RV systolic function, mechanical aortic valve with mean gradient 6 mmHg. Recent TEE 2/25 EF 50-55%, normal RV - Euvolemic on exam, NYHA Class II, limited more by fatigue that dyspnea  - continue Entresto 24-26 mg bid. BP too soft for titration   - continue Jardiance 10 mg daily  - continue spiro 25 mg daily  - continue coreg 9.375 mg bid   3. CAD - Complicated disease, coronary angiography 10/20 showed patent SVG-RCA territory and patent LIMA-LAD.  The proximal LAD was occluded and there was 99% distal left main stenosis.  This left the ramus and LCx in jeopardy.  He was not a good candidate for protected PCI => cannot place Impella with mechanical aortic valve and peripheral vascular disease likely precludes a femoral IABP.  It was decided to try to manage him medically.  However, he returned with progressive chest pain episodes and NSTEMI, hs-TnI up to 935. S/p successful orbital atherectomy and stenting of the left main with a 4.0x15 mm Resolute Onyx DES 10/10/19. - denies CP  - no ASA given need for chronic coumadin and h/o GIB - on ? blocker  4. S/p Mechan[employer] AVR  - stable on echo 1/24 and TEE 2/25 - on Warfarin, INR check scheduled for today  5. PAD extensive, s/p aorto-bifemoral bypass, h/o iliac stenting, h/o femoral endarterectomy, PCI to right SFA in 2017.  Angiography in 10/20 with 80% ISR in right SFA.  He had PCI to right SFA in 3/21, no claudication now.  Repeat ABIs in 1/22 with mild left SFA disease. Peripheral arterial dopplers in 2/23 showed 50-99% in-stent restenosis in the right SFA stent, repeat dopplers in 3/24  were unchanged.  Minimal claudication.  - Medical management for now per Dr. Allyson Sabal.   6. Carotid/subclavian stenosis: Mild disease on dopplers in 4/22.  - Followed at [facility].    6. H/o GIB  - denies any recent melena/hematochezia   Keep outpatient DCCV scheduled 3/17. F/u the following wk w/ APP for post procedural f/u and EKG.   Robbie Lis  PA-C  02/08/2024

## 2024-02-15 ENCOUNTER — Other Ambulatory Visit (HOSPITAL_COMMUNITY): Payer: Self-pay | Admitting: Cardiology

## 2024-02-16 ENCOUNTER — Telehealth: Payer: Self-pay

## 2024-02-16 ENCOUNTER — Ambulatory Visit: Attending: Cardiovascular Disease

## 2024-02-16 DIAGNOSIS — I359 Nonrheumatic aortic valve disorder, unspecified: Secondary | ICD-10-CM

## 2024-02-16 DIAGNOSIS — G459 Transient cerebral ischemic attack, unspecified: Secondary | ICD-10-CM

## 2024-02-16 DIAGNOSIS — Z5181 Encounter for therapeutic drug level monitoring: Secondary | ICD-10-CM

## 2024-02-16 LAB — POCT INR: INR: 6.3 — AB (ref 2.0–3.0)

## 2024-02-16 NOTE — Telephone Encounter (Addendum)
 Confirmed with Dr Shirlee Latch, pt will proceed with DCCV on Monday. Called pt and provided him with Warfarin doing instructions (refer to anticoagulation encounter) and scheduled post DCCV coumadin clinic appt on Friday, 02/25/24.

## 2024-02-16 NOTE — Patient Instructions (Signed)
 Description   Spoke with pt and advised to hold warfarin today and hold warfarin tomorrow and then START taking warfarin 1 tablet daily EXCEPT 1.5 tablet on Monday, Wednesday, and Friday Recheck INR post DCCV on 02/25/24.  Stay consistent with greens.  Coumadin Clinic for any changes in medications or upcoming procedures.  Coumadin Clinic [phone] *Amio restarted in Feb, 200mg  daily*

## 2024-02-16 NOTE — Telephone Encounter (Signed)
-----   Message from Marca Ancona sent at 02/16/2024 12:53 PM EDT ----- Regarding: RE: Supratherapeutic INR/ Upcoming DCCV on Monday As long as INR does not go low, he can still have DCCV. ----- Message ----- From: Beverely Low, RN Sent: 02/16/2024  12:05 PM EDT To: Laurey Morale, MD; Noralee Space, RN Subject: Supratherapeutic INR/ Upcoming DCCV on Monday  Hi,  Pt came to coumadin clinic this morning to have INR checked prior to DCCV on Monday, 02/21/24. Unfortunately, pt's INR was 6.3 today and 5.2 last week. Pt denies any changes in medications or diet. No signs or symptoms of bleeding. Wanted to make you aware and see if pt will still have DCCV prior to giving him dosing instructions or scheduling next INR check.   Thanks,  Cammy Copa, Charity fundraiser

## 2024-02-18 NOTE — Progress Notes (Signed)
 Spoke to patient and instructed them to come at 0630  and to be NPO after 0000.  Medications reviewed.    Confirmed that patient will have a ride home and someone to stay with them for 24 hours after the procedure.

## 2024-02-21 ENCOUNTER — Encounter (HOSPITAL_COMMUNITY)
Admission: RE | Disposition: A | Discharge status: Home / Self Care | Payer: Self-pay | Source: Home / Self Care | Attending: Cardiology

## 2024-02-21 ENCOUNTER — Ambulatory Visit (HOSPITAL_COMMUNITY)
Admission: RE | Admit: 2024-02-21 | Discharge: 2024-02-21 | Disposition: A | Discharge status: Home / Self Care | Payer: PPO | Attending: Cardiology | Admitting: Cardiology

## 2024-02-21 ENCOUNTER — Other Ambulatory Visit: Payer: Self-pay

## 2024-02-21 ENCOUNTER — Ambulatory Visit (HOSPITAL_COMMUNITY): Admitting: Certified Registered"

## 2024-02-21 ENCOUNTER — Encounter (HOSPITAL_COMMUNITY): Payer: Self-pay | Admitting: Cardiology

## 2024-02-21 DIAGNOSIS — E1151 Type 2 diabetes mellitus with diabetic peripheral angiopathy without gangrene: Secondary | ICD-10-CM | POA: Insufficient documentation

## 2024-02-21 DIAGNOSIS — I5042 Chronic combined systolic (congestive) and diastolic (congestive) heart failure: Secondary | ICD-10-CM | POA: Diagnosis not present

## 2024-02-21 DIAGNOSIS — I4891 Unspecified atrial fibrillation: Secondary | ICD-10-CM

## 2024-02-21 DIAGNOSIS — I252 Old myocardial infarction: Secondary | ICD-10-CM | POA: Insufficient documentation

## 2024-02-21 DIAGNOSIS — Z87891 Personal history of nicotine dependence: Secondary | ICD-10-CM | POA: Diagnosis not present

## 2024-02-21 DIAGNOSIS — Z79899 Other long term (current) drug therapy: Secondary | ICD-10-CM | POA: Diagnosis not present

## 2024-02-21 DIAGNOSIS — Z952 Presence of prosthetic heart valve: Secondary | ICD-10-CM | POA: Diagnosis not present

## 2024-02-21 DIAGNOSIS — I480 Paroxysmal atrial fibrillation: Secondary | ICD-10-CM | POA: Insufficient documentation

## 2024-02-21 DIAGNOSIS — Z7984 Long term (current) use of oral hypoglycemic drugs: Secondary | ICD-10-CM | POA: Diagnosis not present

## 2024-02-21 DIAGNOSIS — I4892 Unspecified atrial flutter: Secondary | ICD-10-CM

## 2024-02-21 DIAGNOSIS — I2510 Atherosclerotic heart disease of native coronary artery without angina pectoris: Secondary | ICD-10-CM | POA: Insufficient documentation

## 2024-02-21 DIAGNOSIS — I255 Ischemic cardiomyopathy: Secondary | ICD-10-CM | POA: Diagnosis not present

## 2024-02-21 DIAGNOSIS — N1831 Chronic kidney disease, stage 3a: Secondary | ICD-10-CM | POA: Diagnosis not present

## 2024-02-21 DIAGNOSIS — Z8249 Family history of ischemic heart disease and other diseases of the circulatory system: Secondary | ICD-10-CM | POA: Insufficient documentation

## 2024-02-21 DIAGNOSIS — Z8673 Personal history of transient ischemic attack (TIA), and cerebral infarction without residual deficits: Secondary | ICD-10-CM | POA: Insufficient documentation

## 2024-02-21 DIAGNOSIS — I110 Hypertensive heart disease with heart failure: Secondary | ICD-10-CM | POA: Diagnosis not present

## 2024-02-21 DIAGNOSIS — Z7901 Long term (current) use of anticoagulants: Secondary | ICD-10-CM | POA: Diagnosis not present

## 2024-02-21 DIAGNOSIS — J449 Chronic obstructive pulmonary disease, unspecified: Secondary | ICD-10-CM | POA: Diagnosis not present

## 2024-02-21 DIAGNOSIS — Z95828 Presence of other vascular implants and grafts: Secondary | ICD-10-CM | POA: Diagnosis not present

## 2024-02-21 DIAGNOSIS — I130 Hypertensive heart and chronic kidney disease with heart failure and stage 1 through stage 4 chronic kidney disease, or unspecified chronic kidney disease: Secondary | ICD-10-CM | POA: Diagnosis not present

## 2024-02-21 DIAGNOSIS — I5033 Acute on chronic diastolic (congestive) heart failure: Secondary | ICD-10-CM | POA: Diagnosis not present

## 2024-02-21 DIAGNOSIS — E1122 Type 2 diabetes mellitus with diabetic chronic kidney disease: Secondary | ICD-10-CM | POA: Diagnosis not present

## 2024-02-21 HISTORY — PX: CARDIOVERSION: EP1203

## 2024-02-21 LAB — POCT I-STAT, CHEM 8
BUN: 36 mg/dL — ABNORMAL HIGH (ref 8–23)
Calcium, Ion: 1.21 mmol/L (ref 1.15–1.40)
Chloride: 103 mmol/L (ref 98–111)
Creatinine, Ser: 1.4 mg/dL — ABNORMAL HIGH (ref 0.61–1.24)
Glucose, Bld: 103 mg/dL — ABNORMAL HIGH (ref 70–99)
HCT: 45 % (ref 39.0–52.0)
Hemoglobin: 15.3 g/dL (ref 13.0–17.0)
Potassium: 4.2 mmol/L (ref 3.5–5.1)
Sodium: 141 mmol/L (ref 135–145)
TCO2: 25 mmol/L (ref 22–32)

## 2024-02-21 LAB — PROTIME-INR
INR: 2.3 — ABNORMAL HIGH (ref 0.8–1.2)
Prothrombin Time: 25.2 s — ABNORMAL HIGH (ref 11.4–15.2)

## 2024-02-21 SURGERY — CARDIOVERSION (CATH LAB)
Anesthesia: General

## 2024-02-21 MED ORDER — SODIUM CHLORIDE 0.9% FLUSH: 3.0000 mL | Freq: Two times a day (BID) | INTRAVENOUS | Status: DC

## 2024-02-21 MED ORDER — SODIUM CHLORIDE 0.9% FLUSH: 3.0000 mL | INTRAVENOUS | Status: DC | PRN

## 2024-02-21 MED ORDER — PROPOFOL 10 MG/ML IV BOLUS
INTRAVENOUS | Status: DC | PRN
  Administered 2024-02-21: 80 mg via INTRAVENOUS

## 2024-02-21 SURGICAL SUPPLY — 1 items: PAD DEFIB RADIO PHYSIO CONN (PAD) ×1 IMPLANT

## 2024-02-21 NOTE — Anesthesia Postprocedure Evaluation (Signed)
 Anesthesia Post Note  Patient: [name] [name]  Procedure([name]) Performed: CARDIOVERSION     Patient location during evaluation: Cath Lab Anesthesia Type: General Level of consciousness: awake and alert Pain management: pain level controlled Vital Signs Assessment: post-procedure vital signs reviewed and stable Respiratory status: spontaneous breathing, nonlabored ventilation, respiratory function stable and patient connected to nasal cannula oxygen Cardiovascular status: blood pressure returned to baseline and stable Postop Assessment: no apparent nausea or vomiting Anesthetic complications: no   No notable events documented.  Last Vitals:  Vitals:   02/21/24 0825 02/21/24 0830  BP: (!) 124/41 (!) 136/58  Pulse: 68 70  Resp: (!) 21 19  Temp:    SpO2: 93% 91%    Last Pain:  Vitals:   02/21/24 0805  TempSrc: Temporal  PainSc: 0-No pain                 [name] [name]

## 2024-02-21 NOTE — Procedures (Signed)
 Electrical Cardioversion Procedure Note [name] [name] [id number] Jun 08, 1946  Procedure: Electrical Cardioversion Indications:  Atrial Fibrillation  Procedure Details Consent: Risks of procedure as well as the alternatives and risks of each were explained to the (patient/caregiver).  Consent for procedure obtained. Time Out: Verified patient identification, verified procedure, site/side was marked, verified correct patient position, special equipment/implants available, medications/allergies/relevent history reviewed, required imaging and test results available.  Performed  Patient placed on cardiac monitor, pulse oximetry, supplemental oxygen as necessary.  Sedation given:  Propofol per anesthesiology Pacer pads placed anterior and posterior chest.  Cardioverted 3 times with sternal pressure.  Cardioverted at 360J.  Evaluation Findings: Post procedure EKG shows: Atrial Fibrillation Complications: None Patient did tolerate procedure well.   [name] [name] 02/21/2024, 8:06 AM

## 2024-02-21 NOTE — Discharge Instructions (Signed)

## 2024-02-21 NOTE — Transfer of Care (Signed)
 Immediate Anesthesia Transfer of Care Note  Patient: [name] [name]  Procedure(s) Performed: CARDIOVERSION  Patient Location: PACU  Anesthesia Type:MAC  Level of Consciousness: drowsy  Airway & Oxygen Therapy: Patient connected to nasal cannula oxygen  Post-op Assessment: Report given to RN and Post -op Vital signs reviewed and stable  Post vital signs: Reviewed and stable  Last Vitals:  Vitals Value Taken Time  BP    Temp    Pulse    Resp    SpO2      Last Pain:  Vitals:   02/21/24 0650  TempSrc: Temporal         Complications: No notable events documented.

## 2024-02-21 NOTE — Anesthesia Preprocedure Evaluation (Signed)
 Anesthesia Evaluation  Patient identified by MRN, date of birth, ID band Patient awake    Reviewed: Allergy & Precautions, H&P , NPO status , Patient's Chart, lab work & pertinent test results  Airway Mallampati: II   Neck ROM: full    Dental   Pulmonary COPD, former smoker   breath sounds clear to auscultation       Cardiovascular hypertension, + CAD, + Past MI, + Peripheral Vascular Disease and +CHF  + dysrhythmias Atrial Fibrillation + Valvular Problems/Murmurs  Rhythm:irregular Rate:Normal  S/p AVR with mechanical valve.   Neuro/Psych  Headaches TIACVA    GI/Hepatic ,GERD  ,,  Endo/Other  diabetes, Type 2    Renal/GU      Musculoskeletal   Abdominal   Peds  Hematology   Anesthesia Other Findings   Reproductive/Obstetrics                             Anesthesia Physical Anesthesia Plan  ASA: 3  Anesthesia Plan: General   Post-op Pain Management:    Induction: Intravenous  PONV Risk Score and Plan: 2 and Propofol infusion and Treatment may vary due to age or medical condition  Airway Management Planned: Nasal Cannula  Additional Equipment:   Intra-op Plan:   Post-operative Plan:   Informed Consent: I have reviewed the patients History and Physical, chart, labs and discussed the procedure including the risks, benefits and alternatives for the proposed anesthesia with the patient or authorized representative who has indicated his/her understanding and acceptance.     Dental advisory given  Plan Discussed with: CRNA, Anesthesiologist and Surgeon  Anesthesia Plan Comments:        Anesthesia Quick Evaluation

## 2024-02-21 NOTE — Interval H&P Note (Signed)
 History and Physical Interval Note:  02/21/2024 7:39 AM  [name] [name]  has presented today for surgery, with the diagnosis of AFLUTTER.  The various methods of treatment have been discussed with the patient and family. After consideration of risks, benefits and other options for treatment, the patient has consented to  Procedure(s): CARDIOVERSION (N/A) as a surgical intervention.  The patient's history has been reviewed, patient examined, no change in status, stable for surgery.  I have reviewed the patient's chart and labs.  Questions were answered to the patient's satisfaction.     Khailee Mick [employer]

## 2024-02-25 ENCOUNTER — Ambulatory Visit: Attending: Cardiovascular Disease

## 2024-02-25 DIAGNOSIS — G459 Transient cerebral ischemic attack, unspecified: Secondary | ICD-10-CM

## 2024-02-25 DIAGNOSIS — I359 Nonrheumatic aortic valve disorder, unspecified: Secondary | ICD-10-CM

## 2024-02-25 DIAGNOSIS — Z5181 Encounter for therapeutic drug level monitoring: Secondary | ICD-10-CM

## 2024-02-25 LAB — POCT INR: INR: 4.5 — AB (ref 2.0–3.0)

## 2024-02-25 NOTE — Patient Instructions (Signed)
 Hold warfarin today and then START taking warfarin 1 tablet daily EXCEPT 1.5 tablet on Monday, Wednesday, and Friday  Stay consistent with greens.  Coumadin Clinic for any changes in medications or upcoming procedures.  Coumadin Clinic [phone] *Amio restarted in Feb, 200mg  daily*

## 2024-02-28 DIAGNOSIS — C44329 Squamous cell carcinoma of skin of other parts of face: Secondary | ICD-10-CM | POA: Diagnosis not present

## 2024-02-28 DIAGNOSIS — Z85828 Personal history of other malignant neoplasm of skin: Secondary | ICD-10-CM | POA: Diagnosis not present

## 2024-03-01 ENCOUNTER — Ambulatory Visit: Attending: Cardiology

## 2024-03-01 ENCOUNTER — Telehealth (HOSPITAL_COMMUNITY): Payer: Self-pay

## 2024-03-01 DIAGNOSIS — Z5181 Encounter for therapeutic drug level monitoring: Secondary | ICD-10-CM | POA: Diagnosis not present

## 2024-03-01 DIAGNOSIS — G459 Transient cerebral ischemic attack, unspecified: Secondary | ICD-10-CM | POA: Diagnosis not present

## 2024-03-01 DIAGNOSIS — I359 Nonrheumatic aortic valve disorder, unspecified: Secondary | ICD-10-CM

## 2024-03-01 LAB — POCT INR: INR: 6.2 — AB (ref 2.0–3.0)

## 2024-03-01 NOTE — Telephone Encounter (Signed)
 Called to confirm/remind patient of their appointment at [facility] on 03/02/24.   Appointment:   [] Confirmed  [x] Left mess   [] No answer/No voice mail  [] Phone not in service  And to bring in all medications and/or complete list.

## 2024-03-01 NOTE — Patient Instructions (Signed)
 Description   Hold Warfarin tomorrow, Friday, and Saturday and then START taking warfarin 1 tablet daily.  Stay consistent with greens.  Recheck INR on Tuesday.  IF YOU EXPERIENCE ANY SIGNS OR SYMPTOMS OF BLEEDING SEEK IMMEDIATE MEDICAL ATTENTION.  Coumadin Clinic for any changes in medications or upcoming procedures.  Coumadin Clinic [phone] *Amio restarted in Feb, 200mg  daily*

## 2024-03-01 NOTE — Progress Notes (Incomplete)
 ADVANCED HF CLINIC NOTE  PCP: Creola Corn, MD HF Cardiology: Dr. Shirlee Latch  Reason for Visit: Heart Failure Follow-up HPI: [name] [name] is a 78 y.o. male with a history of CAD s/p CABG, mechanical AVR on coumadin, PAF, extensive PAD, DM, HTN, HLD, ITP, TIA, and recurrent GI bleed.   Long history of PAF with RVR S/P multiple DCCVs and failed AF ablation (12/21)  LHC in 10/20 showed 99% left main stenosis and patent LIMA to occluded LAD and a patent sequential vein to the PDA and PLA of an occluded dominant right. Medical therapy rec'd. Readmitted for CP s/p successful orbital atherectomy, PTCA, and DES to LM. LIMA-LAD patent. SVG-PDA patent. EF down to 30-35%, mechanical AoV ok. TEE 11/9 w/ improved EF, 40-45%.   In 3/21 s/p stenting to right SFA.    Echo 1/22 showed EF 60-65%, normal RV size and systolic function, stable mechanical aortic valve.   CT chest 1/22 diagnosed with squamous cell lung cancer.  He underwent radiation therapy.  He has had episodes concerning for vertebrobasilar TIAs.  Significant disease noted in the vertebral arteries.  He was seen by neurology who wanted him referred to interventional neuroradiology for angiography with possible PCI to the vertebrals. It does not appear that this ever occurred.   Echo in 1/24 showed EF 45-50%, basal to mid inferolateral hypokinesis, mildly decreased RV systolic function, mechanical aortic valve with mean gradient 6 mmHg.   Noted to be in Aflutter 1/25 in clinic and underwent unsuccessful TEE/ DCCV x3 on 2/11 and continued on amio. TEE showed EF 50-55%, no regional WMAs, nl RV, Trivial TR, peak RV-RA gradient 22 mmHg, Trivial mitral regurgitation, AoV ok.  Repeat DCCV on 02/21/24 also with post procedure EKG with atrial fibrillation.  He returns today for HF follow up. Overall feeling well. NYHA II-IIIa. Report exertional dyspnea with stairs and cough with blood related to prior radiation. Endorses mild BLE edema L>R,  suspect some PVD. Denies chest pain, dyspnea, fatigue, near-syncope, orthopnea, and palpitations. Appetite okay. Able to perform ADLs. Appetite okay. Weight at home 166-169 lb, 166 this am.  Compliant with all medications.  PMH  1. CAD:  S/P CABG (3/04) with LIMA-LAD, seq SVG-PDA/PLV.  - NSTEMI 10/20 with cath showing patent LIMA-LAD and SVG-PDA/PLV but 99% LM stenosis, occluded LAD, moderate ramus and high OM1 jeopardized, also 90% mid AV groove LCx stenosis.  Occluded RCA.  Patient ultimately had orbital atherectomy and stenting of left main with a 4.0 x 15 mm Resolute Onyx DES.  2. Mechanical aortic valve: TEE 11/20 showed stable-appearing mechanical valve, mean gradient 9 mmHg.  3. PAD: Aortobifemoral bypass in 2016 with follow up iliac stenting and femoral endarterectomy. In 2017, he had subsequent ostial and mid right SFA intervention. - Angiography 10/20 with 80% in-stent restenosis in proximal right SFA.  - Peripheral arterial dopplers (2/21) with 75-99% R SFA stenosis in prior stented area.  - 3/21 Right SFA stent (Dr. Allyson Sabal).  - ABIs (1/22): Stable mild left SFA disease.  - Peripheral arterial dopplers (2/23): 50-99% proximal R SFA in-stent restenosis.  Medical management for now.  - Peripheral arterial dopplers (3/24): 50-99% proximal R SFA in-stent restenosis.  4. Type 2 diabetes.  5. HTN - Renal artery dopplers (8/21): No significant stenosis.  6. Hyperlipidemia 7. H/o ITP  8. TIAs  9. Chronic Systolic HF: Ischemic cardiomyopathy.  - Echo (10/20): EF 30-35%, mechanical AoV ok.  - TEE (11/20): w/ improved EF, 40-45%. Mechanical aortic valve with mean  gradient 9 mmHg, normal RV.  - Echo (6/21): EF 55-60%, mild LVH, mechanical AoV with mean gradient 9 mmHg.  - Echo (1/22): EF 60-65%, normal RV size and systolic function, stable mechanical aortic valve.  - Echo (1/24): EF 45-50%, basal to mid inferolateral hypokinesis, mildly decreased RV systolic function, mechanical aortic valve  with mean gradient 6 mmHg. 10. Atrial fibrillation: Paroxysmal.  - Atrial fibrillation ablation 12/21.  - DCCV 1/22 11. Renal artery stenosis: Hx of left renal artery stenting in 2016 with repeat intervention on left for 75% in-stent restenosis, with progression of disease on the right renal artery showing 60% stenosis on duplex 07/12/19. - Renal artery dopplers (8/23): 1-59% bilateral RAS.  12. Carotid stenosis: 4/21 carotid dopplers with right subclavian stenosis, 40-59% RICA stenosis.  - Carotid dopplers (4/22): 1-39% BICA stenosis.  13. Squamous cell lung cancer: Treated with XRT.     ROS: All systems negative except as listed in HPI, PMH and Problem List.  Social History   Socioeconomic History   Marital status: Divorced    Spouse name: Not on file   Number of children: 3   Years of education: Not on file   Highest education level: Not on file  Occupational History   Occupation: Retired  Tobacco Use   Smoking status: Former    Current packs/day: 0.00    Average packs/day: 1.5 packs/day for 30.0 years (45.0 ttl pk-yrs)    Types: Cigarettes, Cigars    Start date: 12/16/1963    Quit date: 12/15/1993    Years since quitting: 30.2   Smokeless tobacco: Never   Tobacco comments:    occasional cigar  Vaping Use   Vaping status: Never Used  Substance and Sexual Activity   Alcohol use: Yes    Alcohol/week: 15.0 standard drinks of alcohol    Types: 1 Cans of beer, 7 Shots of liquor, 7 Standard drinks or equivalent per week    Comment: drinks 1 martini's a night (2 shots)   Drug use: No   Sexual activity: Yes  Other Topics Concern   Not on file  Social History Narrative   Tries to remain active.  Frequent golfer but claudication limits this.   1 child has passed   Social Drivers of Corporate investment banker Strain: Not on file  Food Insecurity: Not on file  Transportation Needs: Not on file  Physical Activity: Not on file  Stress: Not on file  Social Connections: Not on  file  Intimate Partner Violence: Not on file   Family History  Problem Relation Age of Onset   Coronary artery disease Mother        bypass surgery - deceased   Heart disease Father        murmur, valve replacement - deceased   Breast cancer Sister    Diabetes Other        grandmother   Diabetes Paternal Grandmother    Diabetes Paternal Aunt    Colon cancer Neg Hx    Colon polyps Neg Hx    Esophageal cancer Neg Hx    Rectal cancer Neg Hx    Stomach cancer Neg Hx    Current Outpatient Medications  Medication Sig Dispense Refill   amiodarone (PACERONE) 200 MG tablet Take 200 mg by mouth in the morning.     atorvastatin (LIPITOR) 80 MG tablet Take 80 mg by mouth in the morning.     Blood Glucose Monitoring Suppl (ONETOUCH VERIO IQ SYSTEM) w/Device KIT  carvedilol (COREG) 6.25 MG tablet Take 1.5 tablets (9.375 mg total) by mouth 2 (two) times daily. 270 tablet 3   ENTRESTO 24-26 MG TAKE 1 TABLET BY MOUTH TWICE A DAY 60 tablet 6   Evolocumab 140 MG/ML SOAJ Inject 140 mg into the skin every 14 (fourteen) days.     furosemide (LASIX) 20 MG tablet TAKE 3 TABLETS (60 MG TOTAL) BY MOUTH DAILY. (Patient taking differently: Take 40 mg by mouth in the morning.)     glucose blood (ONETOUCH VERIO) test strip      glyBURIDE (DIABETA) 2.5 MG tablet Take 5 mg by mouth 2 (two) times daily with a meal.     JARDIANCE 25 MG TABS tablet Take 25 mg by mouth in the morning.     nitroGLYCERIN (NITROSTAT) 0.4 MG SL tablet Place 1 tablet (0.4 mg total) under the tongue every 5 (five) minutes x 3 doses as needed for chest pain. 25 tablet 12   OneTouch Delica Lancets 33G MISC      spironolactone (ALDACTONE) 25 MG tablet TAKE 1 TABLET BY MOUTH EVERY DAY 90 tablet 3   UNABLE TO FIND Med Name: Cefalexin  400 mg 1 tablet by mouth TID     warfarin (COUMADIN) 5 MG tablet TAKE 1 TO 1 AND 1/2 TABLETS BY MOUTH DAILY AS PRESCRIBED BY THE COUMADIN CLINIC. (Patient not taking: Reported on 03/02/2024) 135 tablet 1    No current facility-administered medications for this encounter.   BP 100/60   Pulse 80   Wt 77.8 kg (171 lb 9.6 oz)   SpO2 91%   BMI 26.09 kg/m   Wt Readings from Last 3 Encounters:  03/02/24 77.8 kg (171 lb 9.6 oz)  02/08/24 78.3 kg (172 lb 9.6 oz)  01/27/24 78.1 kg (172 lb 3.2 oz)   PHYSICAL EXAM: General: Well appearing. No distress on RA. Walked into clinc Cardiac: JVP ~8cm. S1 and S2 present. No murmurs or rub. Extremities: Warm and dry.  1+ BLE, L>R edema.  Neuro: Alert and oriented x3. Affect pleasant. Moves all extremities without difficulty.  ECG (personally reviewed):  Atrial Flutter 73 bpm  ASSESSMENT & PLAN: 1. Persistent Atrial Flutter/ Atrial Fibrillation   - previous multiple DCCV for Afib and had Afib ablation in 2021 - in AFL last OV 1/25, failed DCCV x3 attempts on 2/11, followed by second failed DCCV x3 on 3/17. - EKG today in aflutter at 73 bpm - continue amiodarone 200 mg daily, check LFTs and TSH - on coumadin, INR supra-therapeutic 6.2 yesterday. Holding Coumadin. No bleeding. Check CBC and HFP today. - continue Coreg 9.375 mg bid  - referral to EP to discuss possible re-AFL ablation  2. Chronic Heart Failure, HFrEF>>HFimEF  - Ischemic cardiomyopathy.  Echo 10/20 showed EF 30-35% and WMA, prior echo earlier in 10/20 showed EF 50-55%. Cath 10/20 showed jeopardized ramus and LCx territory (99% distal left main, occluded LAD, patent LIMA-LAD). Concerned that ischemia in this territory triggered CHF, worsening of LV function. Now s/p DES to left main, TEE in 11/20 after intervention showed EF higher at 40-45%.  Echo in 1/22 showed EF 60-65% with normal RV. Echo in 1/24 showed  EF 45-50%, basal to mid inferolateral hypokinesis, mildly decreased RV systolic function, mechanical aortic valve with mean gradient 6 mmHg. Recent TEE 2/25 EF 50-55%, normal RV - Volume up on exam, NYHA II, limited more by fatigue that dyspnea.  - increase to 60 mg x3 days, then  return to 40 mg daily. Take  additional 20 mg PRN for volume.  CMET/BNP today - continue Entresto 24-26 mg bid. BP too soft for titration   - continue Jardiance 10 mg daily  - continue spiro 25 mg daily  - continue coreg 9.375 mg bid  3. CAD - Complicated disease, coronary angiography 10/20 showed patent SVG-RCA territory and patent LIMA-LAD.  The proximal LAD was occluded and there was 99% distal left main stenosis.  This left the ramus and LCx in jeopardy.  He was not a good candidate for protected PCI => cannot place Impella with mechanical aortic valve and peripheral vascular disease likely precludes a femoral IABP.  It was decided to try to manage him medically. However, he returned with progressive chest pain episodes and NSTEMI, hs-TnI up to 935. S/p successful orbital atherectomy and stenting of the left main with a 4.0x15 mm Resolute Onyx DES 10/10/19. - denies CP  - no ASA given need for chronic coumadin and h/o GIB - on ? blocker  4. S/p Mechanica AVR  - stable on echo 1/24 and TEE 2/25 - holding warfarin as above  5. PAD extensive, s/p aorto-bifemoral bypass, h/o iliac stenting, h/o femoral endarterectomy, PCI to right SFA in 2017.  Angiography in 10/20 with 80% ISR in right SFA.  He had PCI to right SFA in 3/21, no claudication now.  Repeat ABIs in 1/22 with mild left SFA disease. Peripheral arterial dopplers in 2/23 showed 50-99% in-stent restenosis in the right SFA stent, repeat dopplers in 3/24 were unchanged. Minimal claudication.  - Medical management for now per Dr. Allyson Sabal.   6. Carotid/subclavian stenosis: Mild disease on dopplers in 4/22.  - Followed at [facility].   7. H/o GIB  - denies any recent melena/hematochezia   Follow up with NP/PA in 2 months  [place] Verne Lanuza, NP 03/02/2024

## 2024-03-02 ENCOUNTER — Other Ambulatory Visit: Payer: Self-pay

## 2024-03-02 ENCOUNTER — Encounter (HOSPITAL_COMMUNITY): Payer: Self-pay

## 2024-03-02 ENCOUNTER — Ambulatory Visit (HOSPITAL_COMMUNITY)
Admission: RE | Admit: 2024-03-02 | Discharge: 2024-03-02 | Disposition: A | Discharge status: Home / Self Care | Source: Ambulatory Visit | Attending: Cardiology | Admitting: Cardiology

## 2024-03-02 VITALS — BP 100/60 | HR 80 | Wt 171.6 lb

## 2024-03-02 DIAGNOSIS — Z955 Presence of coronary angioplasty implant and graft: Secondary | ICD-10-CM | POA: Diagnosis not present

## 2024-03-02 DIAGNOSIS — F17290 Nicotine dependence, other tobacco product, uncomplicated: Secondary | ICD-10-CM | POA: Insufficient documentation

## 2024-03-02 DIAGNOSIS — I4819 Other persistent atrial fibrillation: Secondary | ICD-10-CM

## 2024-03-02 DIAGNOSIS — I255 Ischemic cardiomyopathy: Secondary | ICD-10-CM | POA: Insufficient documentation

## 2024-03-02 DIAGNOSIS — Z7984 Long term (current) use of oral hypoglycemic drugs: Secondary | ICD-10-CM | POA: Diagnosis not present

## 2024-03-02 DIAGNOSIS — I871 Compression of vein: Secondary | ICD-10-CM | POA: Diagnosis not present

## 2024-03-02 DIAGNOSIS — I480 Paroxysmal atrial fibrillation: Secondary | ICD-10-CM | POA: Diagnosis not present

## 2024-03-02 DIAGNOSIS — Z79899 Other long term (current) drug therapy: Secondary | ICD-10-CM | POA: Diagnosis not present

## 2024-03-02 DIAGNOSIS — I2510 Atherosclerotic heart disease of native coronary artery without angina pectoris: Secondary | ICD-10-CM | POA: Insufficient documentation

## 2024-03-02 DIAGNOSIS — D693 Immune thrombocytopenic purpura: Secondary | ICD-10-CM | POA: Diagnosis not present

## 2024-03-02 DIAGNOSIS — I4892 Unspecified atrial flutter: Secondary | ICD-10-CM | POA: Diagnosis not present

## 2024-03-02 DIAGNOSIS — Z85118 Personal history of other malignant neoplasm of bronchus and lung: Secondary | ICD-10-CM | POA: Diagnosis not present

## 2024-03-02 DIAGNOSIS — I110 Hypertensive heart disease with heart failure: Secondary | ICD-10-CM | POA: Insufficient documentation

## 2024-03-02 DIAGNOSIS — Z8673 Personal history of transient ischemic attack (TIA), and cerebral infarction without residual deficits: Secondary | ICD-10-CM | POA: Diagnosis not present

## 2024-03-02 DIAGNOSIS — Z952 Presence of prosthetic heart valve: Secondary | ICD-10-CM | POA: Diagnosis not present

## 2024-03-02 DIAGNOSIS — E1151 Type 2 diabetes mellitus with diabetic peripheral angiopathy without gangrene: Secondary | ICD-10-CM | POA: Diagnosis not present

## 2024-03-02 DIAGNOSIS — Z7901 Long term (current) use of anticoagulants: Secondary | ICD-10-CM | POA: Diagnosis not present

## 2024-03-02 DIAGNOSIS — Z951 Presence of aortocoronary bypass graft: Secondary | ICD-10-CM | POA: Diagnosis not present

## 2024-03-02 DIAGNOSIS — I5032 Chronic diastolic (congestive) heart failure: Secondary | ICD-10-CM | POA: Diagnosis not present

## 2024-03-02 DIAGNOSIS — Z923 Personal history of irradiation: Secondary | ICD-10-CM | POA: Diagnosis not present

## 2024-03-02 DIAGNOSIS — I252 Old myocardial infarction: Secondary | ICD-10-CM | POA: Diagnosis not present

## 2024-03-02 DIAGNOSIS — E785 Hyperlipidemia, unspecified: Secondary | ICD-10-CM | POA: Insufficient documentation

## 2024-03-02 DIAGNOSIS — R791 Abnormal coagulation profile: Secondary | ICD-10-CM | POA: Diagnosis not present

## 2024-03-02 DIAGNOSIS — I5022 Chronic systolic (congestive) heart failure: Secondary | ICD-10-CM | POA: Insufficient documentation

## 2024-03-02 LAB — COMPREHENSIVE METABOLIC PANEL WITH GFR
ALT: 23 U/L (ref 0–44)
AST: 24 U/L (ref 15–41)
Albumin: 3.4 g/dL — ABNORMAL LOW (ref 3.5–5.0)
Alkaline Phosphatase: 62 U/L (ref 38–126)
Anion gap: 11 (ref 5–15)
BUN: 37 mg/dL — ABNORMAL HIGH (ref 8–23)
CO2: 25 mmol/L (ref 22–32)
Calcium: 8.9 mg/dL (ref 8.9–10.3)
Chloride: 101 mmol/L (ref 98–111)
Creatinine, Ser: 1.9 mg/dL — ABNORMAL HIGH (ref 0.61–1.24)
GFR, Estimated: 36 mL/min — ABNORMAL LOW (ref >60)
Glucose, Bld: 180 mg/dL — ABNORMAL HIGH (ref 70–99)
Potassium: 4.6 mmol/L (ref 3.5–5.1)
Sodium: 137 mmol/L (ref 135–145)
Total Bilirubin: 0.9 mg/dL (ref 0.0–1.2)
Total Protein: 6.9 g/dL (ref 6.5–8.1)

## 2024-03-02 LAB — CBC
HCT: 46.1 % (ref 39.0–52.0)
Hemoglobin: 15.3 g/dL (ref 13.0–17.0)
MCH: 31 pg (ref 26.0–34.0)
MCHC: 33.2 g/dL (ref 30.0–36.0)
MCV: 93.3 fL (ref 80.0–100.0)
Platelets: 178 10*3/uL (ref 150–400)
RBC: 4.94 MIL/uL (ref 4.22–5.81)
RDW: 16.5 % — ABNORMAL HIGH (ref 11.5–15.5)
WBC: 6.7 10*3/uL (ref 4.0–10.5)
nRBC: 0 % (ref 0.0–0.2)

## 2024-03-02 LAB — BRAIN NATRIURETIC PEPTIDE: B Natriuretic Peptide: 349.7 pg/mL — ABNORMAL HIGH (ref 0.0–100.0)

## 2024-03-02 LAB — TSH: TSH: 4.593 u[IU]/mL — ABNORMAL HIGH (ref 0.350–4.500)

## 2024-03-02 MED ORDER — FUROSEMIDE 20 MG PO TABS: 40.0000 mg | ORAL_TABLET | Freq: Every day | ORAL | 3 refills | Status: DC

## 2024-03-02 NOTE — Patient Instructions (Signed)
 Take Lasix 60 mg (3 tablets) for three days then back to 40 mg (2 tablets) daily. May take an extra 20 mg for weight gain, swelling, or increased SOB. Please follow up with EP for your rhythm. See below. Labs today - will call you if abnormal. Return to Heart Failure APP Clinic in 2 months - see below. Please call us at [phone] if any questions or concerns prior to your next visit.

## 2024-03-06 ENCOUNTER — Other Ambulatory Visit: Payer: Self-pay | Admitting: Cardiovascular Disease

## 2024-03-06 DIAGNOSIS — I739 Peripheral vascular disease, unspecified: Secondary | ICD-10-CM

## 2024-03-07 ENCOUNTER — Ambulatory Visit: Attending: Cardiology

## 2024-03-07 DIAGNOSIS — I359 Nonrheumatic aortic valve disorder, unspecified: Secondary | ICD-10-CM | POA: Diagnosis not present

## 2024-03-07 DIAGNOSIS — R972 Elevated prostate specific antigen [PSA]: Secondary | ICD-10-CM | POA: Diagnosis not present

## 2024-03-07 DIAGNOSIS — G459 Transient cerebral ischemic attack, unspecified: Secondary | ICD-10-CM

## 2024-03-07 DIAGNOSIS — I1310 Hypertensive heart and chronic kidney disease without heart failure, with stage 1 through stage 4 chronic kidney disease, or unspecified chronic kidney disease: Secondary | ICD-10-CM | POA: Diagnosis not present

## 2024-03-07 DIAGNOSIS — D649 Anemia, unspecified: Secondary | ICD-10-CM | POA: Diagnosis not present

## 2024-03-07 DIAGNOSIS — E041 Nontoxic single thyroid nodule: Secondary | ICD-10-CM | POA: Diagnosis not present

## 2024-03-07 DIAGNOSIS — Z1212 Encounter for screening for malignant neoplasm of rectum: Secondary | ICD-10-CM | POA: Diagnosis not present

## 2024-03-07 DIAGNOSIS — Z5181 Encounter for therapeutic drug level monitoring: Secondary | ICD-10-CM | POA: Diagnosis not present

## 2024-03-07 DIAGNOSIS — E1151 Type 2 diabetes mellitus with diabetic peripheral angiopathy without gangrene: Secondary | ICD-10-CM | POA: Diagnosis not present

## 2024-03-07 DIAGNOSIS — E785 Hyperlipidemia, unspecified: Secondary | ICD-10-CM | POA: Diagnosis not present

## 2024-03-07 LAB — POCT INR: INR: 1.4 — AB (ref 2.0–3.0)

## 2024-03-07 NOTE — Patient Instructions (Signed)
 Take 1 more tablet today only then continue 1 tablet daily.  Stay consistent with greens.  Recheck INR in 2 weeks  IF YOU EXPERIENCE ANY SIGNS OR SYMPTOMS OF BLEEDING SEEK IMMEDIATE MEDICAL ATTENTION.  Coumadin Clinic for any changes in medications or upcoming procedures.  Coumadin Clinic [phone]

## 2024-03-08 ENCOUNTER — Encounter (HOSPITAL_COMMUNITY): Payer: Self-pay

## 2024-03-14 ENCOUNTER — Telehealth: Payer: Self-pay | Admitting: *Deleted

## 2024-03-14 DIAGNOSIS — R82998 Other abnormal findings in urine: Secondary | ICD-10-CM | POA: Diagnosis not present

## 2024-03-14 DIAGNOSIS — N1832 Chronic kidney disease, stage 3b: Secondary | ICD-10-CM | POA: Diagnosis not present

## 2024-03-14 DIAGNOSIS — Z9081 Acquired absence of spleen: Secondary | ICD-10-CM | POA: Diagnosis not present

## 2024-03-14 DIAGNOSIS — E1122 Type 2 diabetes mellitus with diabetic chronic kidney disease: Secondary | ICD-10-CM | POA: Diagnosis not present

## 2024-03-14 DIAGNOSIS — Z7901 Long term (current) use of anticoagulants: Secondary | ICD-10-CM | POA: Diagnosis not present

## 2024-03-14 DIAGNOSIS — Z1339 Encounter for screening examination for other mental health and behavioral disorders: Secondary | ICD-10-CM | POA: Diagnosis not present

## 2024-03-14 DIAGNOSIS — I739 Peripheral vascular disease, unspecified: Secondary | ICD-10-CM | POA: Diagnosis not present

## 2024-03-14 DIAGNOSIS — C3492 Malignant neoplasm of unspecified part of left bronchus or lung: Secondary | ICD-10-CM | POA: Diagnosis not present

## 2024-03-14 DIAGNOSIS — Z0000 Encounter for general adult medical examination without abnormal findings: Secondary | ICD-10-CM | POA: Diagnosis not present

## 2024-03-14 DIAGNOSIS — I4819 Other persistent atrial fibrillation: Secondary | ICD-10-CM | POA: Diagnosis not present

## 2024-03-14 DIAGNOSIS — I6990 Unspecified sequelae of unspecified cerebrovascular disease: Secondary | ICD-10-CM | POA: Diagnosis not present

## 2024-03-14 DIAGNOSIS — Z952 Presence of prosthetic heart valve: Secondary | ICD-10-CM | POA: Diagnosis not present

## 2024-03-14 DIAGNOSIS — D6869 Other thrombophilia: Secondary | ICD-10-CM | POA: Diagnosis not present

## 2024-03-14 DIAGNOSIS — I70222 Atherosclerosis of native arteries of extremities with rest pain, left leg: Secondary | ICD-10-CM | POA: Diagnosis not present

## 2024-03-14 DIAGNOSIS — I130 Hypertensive heart and chronic kidney disease with heart failure and stage 1 through stage 4 chronic kidney disease, or unspecified chronic kidney disease: Secondary | ICD-10-CM | POA: Diagnosis not present

## 2024-03-14 DIAGNOSIS — Z1331 Encounter for screening for depression: Secondary | ICD-10-CM | POA: Diagnosis not present

## 2024-03-14 DIAGNOSIS — I5032 Chronic diastolic (congestive) heart failure: Secondary | ICD-10-CM | POA: Diagnosis not present

## 2024-03-14 DIAGNOSIS — E1151 Type 2 diabetes mellitus with diabetic peripheral angiopathy without gangrene: Secondary | ICD-10-CM | POA: Diagnosis not present

## 2024-03-14 DIAGNOSIS — J449 Chronic obstructive pulmonary disease, unspecified: Secondary | ICD-10-CM | POA: Diagnosis not present

## 2024-03-14 NOTE — Telephone Encounter (Signed)
 Dr Allyson Sabal received a call from dr Timothy Lasso regarding this patient and critical limb ischemia. The patient is scheduled to have dopplers Friday 03/17/24 and per dr berry, he will see dr Jacinto Halim 03/21/24 @ 10 am for follow up. Left message for pt to call so we can tell him about the follow up appointment. Dr berry nor dr Kirke Corin are in the office next week.

## 2024-03-15 ENCOUNTER — Encounter: Payer: Self-pay | Admitting: Podiatry

## 2024-03-15 ENCOUNTER — Ambulatory Visit: Payer: Medicare PPO | Admitting: Podiatry

## 2024-03-15 DIAGNOSIS — B351 Tinea unguium: Secondary | ICD-10-CM | POA: Diagnosis not present

## 2024-03-15 DIAGNOSIS — I739 Peripheral vascular disease, unspecified: Secondary | ICD-10-CM | POA: Diagnosis not present

## 2024-03-15 DIAGNOSIS — D689 Coagulation defect, unspecified: Secondary | ICD-10-CM | POA: Diagnosis not present

## 2024-03-15 NOTE — Telephone Encounter (Signed)
 Pt returning call

## 2024-03-15 NOTE — Telephone Encounter (Signed)
 Patient identification verified by 2 forms. Marilynn Rail, RN    Called and spoke to patient  Relayed message below  Patient verbalized understanding, no questions at this time

## 2024-03-15 NOTE — Progress Notes (Signed)
 This patient returns to my office for at risk foot care.  This patient requires this care by a professional since this patient will be at risk due to having diabetes , chronic anticoagulation renal disease and PVD.  Patient is taking coumadin.  This patient is unable to cut nails himself since the patient cannot reach his nails.These nails are painful walking and wearing shoes.  He also has six spots of black necrosis due to his circulation.  He is scheduled for vascular studies by Dr. Timothy Lasso on Friday.  This patient presents for at risk foot care today.  General Appearance  Alert, conversant and in no acute stress.  Vascular  Dorsalis pedis and posterior tibial  pulses are  weakly palpable  bilaterally.  Capillary return is within normal limits  bilaterally. Temperature is within normal limits  bilaterally.  Neurologic  Senn-Weinstein monofilament wire test within normal limits  bilaterally. Muscle power within normal limits bilaterally.  Nails Thick disfigured discolored nails with subungual debris  from hallux to fifth toes bilaterally. No evidence of bacterial infection or drainage bilaterally.  Orthopedic  No limitations of motion  feet .  No crepitus or effusions noted.  No bony pathology or digital deformities noted.  Skin  normotropic skin with no porokeratosis noted bilaterally.  No signs of infections or ulcers noted.   Black thrombi four on his left great toe and 2 plantarly left forefoot.  Onychomycosis  Pain in right toes  Pain in left toes  Consent was obtained for treatment procedures.   Mechanical debridement of nails 1-5  bilaterally performed with a nail nipper.  Filed with dremel without incident.    Return office visit    4 months                  Told patient to return for periodic foot care and evaluation due to potential at risk complications.   Helane Gunther DPM

## 2024-03-17 ENCOUNTER — Ambulatory Visit (HOSPITAL_BASED_OUTPATIENT_CLINIC_OR_DEPARTMENT_OTHER)
Admission: RE | Admit: 2024-03-17 | Discharge: 2024-03-17 | Disposition: A | Discharge status: Home / Self Care | Source: Ambulatory Visit | Attending: Cardiology | Admitting: Cardiology

## 2024-03-17 ENCOUNTER — Ambulatory Visit (HOSPITAL_COMMUNITY)
Admission: RE | Admit: 2024-03-17 | Discharge: 2024-03-17 | Disposition: A | Discharge status: Home / Self Care | Source: Ambulatory Visit | Attending: Cardiology | Admitting: Cardiology

## 2024-03-17 DIAGNOSIS — I739 Peripheral vascular disease, unspecified: Secondary | ICD-10-CM

## 2024-03-21 ENCOUNTER — Encounter: Payer: Self-pay | Admitting: Cardiology

## 2024-03-21 ENCOUNTER — Ambulatory Visit: Attending: Cardiology | Admitting: Cardiology

## 2024-03-21 ENCOUNTER — Ambulatory Visit (INDEPENDENT_AMBULATORY_CARE_PROVIDER_SITE_OTHER): Payer: Self-pay | Admitting: Pharmacist

## 2024-03-21 ENCOUNTER — Ambulatory Visit

## 2024-03-21 VITALS — BP 108/72 | HR 80 | Resp 16 | Ht 68.0 in | Wt 169.8 lb

## 2024-03-21 DIAGNOSIS — I359 Nonrheumatic aortic valve disorder, unspecified: Secondary | ICD-10-CM

## 2024-03-21 DIAGNOSIS — I480 Paroxysmal atrial fibrillation: Secondary | ICD-10-CM

## 2024-03-21 DIAGNOSIS — Z5181 Encounter for therapeutic drug level monitoring: Secondary | ICD-10-CM

## 2024-03-21 DIAGNOSIS — I2510 Atherosclerotic heart disease of native coronary artery without angina pectoris: Secondary | ICD-10-CM | POA: Diagnosis not present

## 2024-03-21 DIAGNOSIS — G459 Transient cerebral ischemic attack, unspecified: Secondary | ICD-10-CM | POA: Diagnosis not present

## 2024-03-21 DIAGNOSIS — I70222 Atherosclerosis of native arteries of extremities with rest pain, left leg: Secondary | ICD-10-CM

## 2024-03-21 DIAGNOSIS — Z952 Presence of prosthetic heart valve: Secondary | ICD-10-CM

## 2024-03-21 LAB — POCT INR: INR: 3.8 — AB (ref 2.0–3.0)

## 2024-03-21 MED ORDER — ENOXAPARIN SODIUM 120 MG/0.8ML IJ SOSY: 120.0000 mg | PREFILLED_SYRINGE | INTRAMUSCULAR | 0 refills | Status: DC

## 2024-03-21 NOTE — Patient Instructions (Addendum)
 4/15: No warfarin or enoxaparin (Lovenox).  4/16 : Inject enoxaparin 120 mg in the fatty abdominal tissue at least 2 inches from the belly button at 11 PM.  No warfarin.  4/17: . No warfarin, no enoxaprain  4/18: Procedure Day - No enoxaparin - Resume warfarin in the evening or as directed by doctor (take an extra half tablet with usual dose for 2 days then resume normal dose).  4/19: Resume enoxaparin inject in the fatty tissue every 24 hours and take warfarin  4/20: Inject enoxaparin in the fatty tissue every 24 hours and take warfarin  4/21: Inject enoxaparin in the fatty tissue every 24 hours and take warfarin  4/22: Inject enoxaparin in the fatty tissue every 24 hours and take warfarin  4/23: warfarin appt to check INR.

## 2024-03-21 NOTE — Progress Notes (Signed)
 32 ml/min 77kg 120mg  daily    4/15: No warfarin or enoxaparin (Lovenox).  4/16 : Inject enoxaparin 120 mg in the fatty abdominal tissue at least 2 inches from the belly button at 11 PM.  No warfarin.  4/17: . No warfarin, no enoxaprain  4/18: Procedure Day - No enoxaparin - Resume warfarin in the evening or as directed by doctor (take an extra half tablet with usual dose for 1 day then resume normal dose).  4/19: Resume enoxaparin inject in the fatty tissue every 24 hours and take warfarin  4/20: Inject enoxaparin in the fatty tissue every 24 hours and take warfarin  4/21: Inject enoxaparin in the fatty tissue every 24 hours and take warfarin  4/22: Inject enoxaparin in the fatty tissue every 24 hours and take warfarin  4/23: Inject enoxaparin in the fatty tissue every 24 hours and take warfarin  4/24: warfarin appt to check INR.

## 2024-03-21 NOTE — Patient Instructions (Addendum)
 Medication Instructions:  Your physician recommends that you continue on your current medications as directed. Please refer to the Current Medication list given to you today.  *If you need a refill on your cardiac medications before your next appointment, please call your pharmacy*  Lab Work: Lab work to be done today at [facility] on the first floor--CBC and BMP If you have labs (blood work) drawn today and your tests are completely normal, you will receive your results only by: [employer] (if you have MyChart) OR A paper copy in the mail If you have any lab test that is abnormal or we need to change your treatment, we will call you to review the results.  Testing/Procedures: Your physician has requested that you have a peripheral vascular angiogram. This exam is performed at the hospital. During this exam IV contrast is used to look at arterial blood flow. Please review the information sheet given for details. Scheduled for 4/18  Follow-Up: At [facility], you and your health needs are our priority.  As part of our continuing mission to provide you with exceptional heart care, our providers are all part of one team.  This team includes your primary Cardiologist (physician) and Advanced Practice Providers or APPs (Physician Assistants and Nurse Practitioners) who all work together to provide you with the care you need, when you need it.  Your next appointment:   To be arranged after procedure  Provider:   Dr Allyson Sabal   We recommend signing up for the patient portal called "MyChart".  Sign up information is provided on this After Visit Summary.  MyChart is used to connect with patients for Virtual Visits (Telemedicine).  Patients are able to view lab/test results, encounter notes, upcoming appointments, etc.  Non-urgent messages can be sent to your provider as well.   To learn more about what you can do with MyChart, go to [email or web address].   Other Instructions   [facility] DEPT OF [place]. [facility] AT [facility] [address] [place] [address] Dept: [phone] Loc: [phone]  [name] [name]  03/21/2024  You are scheduled for a Peripheral Angiogram on Friday, April 18 with Dr. Jacinto Halim  1. Please arrive at the [facility] (Main Entrance A) at [facility]: [address] [place], [place] [address] at 7:00 AM (This time is 5 hour(s) before your procedure to ensure your preparation).   Free valet parking service is available. You will check in at ADMITTING. The support person will be asked to wait in the waiting room.  It is OK to have someone drop you off and come back when you are ready to be discharged.    Special note: Every effort is made to have your procedure done on time. Please understand that emergencies sometimes delay scheduled procedures.  2. Diet: Do not eat solid foods after midnight.  The patient may have clear liquids until 5am upon the day of the procedure.   3  You will need to have lab work drawn today at [facility]. [place] do not need to be fasting.  4. Medication instructions in preparation for your procedure:  Do not take spironolactone day before and day of procedure Do not take entresto day before and day of procedure Do not take lasix day before and day of procedure. Do not take Jardiance and glyburide or any diabetes medication the morning of the procedure  Coumadin clinic will provide instructions regarding  Coumadin and lovenox bridging     Contrast Allergy: No   On the morning of your procedure, take your Aspirin 81 mg and any morning medicines NOT listed above.  You may use sips of water.  5. Plan to go home the same day, you will only stay overnight if medically necessary. 6. Bring a current list of your medications and current insurance cards. 7. You MUST have a responsible person to drive you home. 8. Someone MUST be with you  the first 24 hours after you arrive home or your discharge will be delayed. 9. Please wear clothes that are easy to get on and off and wear slip-on shoes.  Thank you for allowing us  to care for you!   -- [facility]       1st Floor: - Lobby - Registration  - Pharmacy  - Lab - Cafe  2nd Floor: - PV Lab - Diagnostic Testing (echo, CT, nuclear med)  3rd Floor: - Vacant  4th Floor: - TCTS (cardiothoracic surgery) - AFib Clinic - Structural Heart Clinic - Vascular Surgery  - Vascular Ultrasound  5th Floor: - HeartCare Cardiology (general and EP) - Clinical Pharmacy for coumadin, hypertension, lipid, weight-loss medications, and med management appointments    Valet parking services will be available as well.

## 2024-03-21 NOTE — Progress Notes (Signed)
 Cardiology Office Note:  .   Date:  03/21/2024  ID:  [name], [name] January 18, 1946, MRN [id number] PCP: [place], Peter M, MD  [facility] Providers Cardiologist:  Janelle Mediate, MD   History of Present Illness: .    [name] [name] is a 78 y.o. male with a history of CAD s/p CABG in 2020, mechanical AVR on coumadin, PAF, heart failure with mildly reduced LVEF extensive PAD, DM, HTN, HLD, ITP, TIA, and recurrent GI bleed, last bleeding episode 3 years ago.  Patient previously managed by Dr. Lauro Portal from peripheral arterial disease standpoint, aortobifem bypass in 2013, bilateral iliac artery angioplasty in 2013 and multiple interventions to right SFA.  He has had a focal high-grade 80 to 90% stenosis in the left SFA but close in the absence of claudication.  He is now referred manage urgent basis to evaluate for critical limb ischemia and diastolic severe purplish discoloration of his toes and superficial eschar.   Patient states that he started having pain and discoloration of his left toes about 3 to 4 weeks ago.  He has been having constant discomfort in his feet as well.  No fever, no discharge, no open wounds although he has noticed black spots on his greater toe.  Labs   Lab Results  Component Value Date   CHOL 162 08/31/2023   HDL 58 08/31/2023   LDLCALC 79 08/31/2023   TRIG 124 08/31/2023   CHOLHDL 2.8 08/31/2023   Lab Results  Component Value Date   NA 137 03/02/2024   K 4.6 03/02/2024   CO2 25 03/02/2024   GLUCOSE 180 (H) 03/02/2024   BUN 37 (H) 03/02/2024   CREATININE 1.90 (H) 03/02/2024   CALCIUM 8.9 03/02/2024   GFRNONAA 36 (L) 03/02/2024      Latest Ref Rng & Units 03/02/2024   10:31 AM 02/21/2024    7:03 AM 01/17/2024    9:02 AM  BMP  Glucose 70 - 99 mg/dL 027  253  664   BUN 8 - 23 mg/dL 37  36  24   Creatinine 0.61 - 1.24 mg/dL 4.03  4.74  2.59   Sodium 135 - 145 mmol/L 137  141  138   Potassium 3.5 - 5.1 mmol/L 4.6  4.2  4.5    Chloride 98 - 111 mmol/L 101  103  101   CO2 22 - 32 mmol/L 25   27   Calcium 8.9 - 10.3 mg/dL 8.9   8.9       Latest Ref Rng & Units 03/02/2024   10:31 AM 02/21/2024    7:03 AM 01/17/2024    9:02 AM  CBC  WBC 4.0 - 10.5 K/uL 6.7   8.4   Hemoglobin 13.0 - 17.0 g/dL 56.3  87.5  64.3   Hematocrit 39.0 - 52.0 % 46.1  45.0  47.3   Platelets 150 - 400 K/uL 178   208    Lab Results  Component Value Date   TSH 4.593 (H) 03/02/2024     Review of Systems  Cardiovascular:  Positive for claudication (left leg). Negative for chest pain, dyspnea on exertion and leg swelling.  Skin:  Positive for nail changes (left foot) and poor wound healing (left foot).   Physical Exam:   VS:  There were no vitals taken for this visit.   Wt Readings from Last 3 Encounters:  03/02/24 171 lb 9.6 oz (77.8 kg)  02/08/24 172 lb 9.6 oz (78.3 kg)  01/27/24 172  lb 3.2 oz (78.1 kg)    Physical Exam Neck:     Vascular: No carotid bruit or JVD.  Cardiovascular:     Rate and Rhythm: Normal rate. Rhythm irregular.     Pulses:          Femoral pulses are 2+ on the right side and 2+ on the left side.      Popliteal pulses are 1+ on the right side and 1+ on the left side.       Dorsalis pedis pulses are 0 on the right side and 0 on the left side.       Posterior tibial pulses are 0 on the right side and 0 on the left side.     Heart sounds: Normal heart sounds. No murmur heard.    No gallop.     Comments: Crisp mechanical S2 heard. See enclosed image of the left foot Pulmonary:     Effort: Pulmonary effort is normal.     Breath sounds: Normal breath sounds.  Abdominal:     General: Bowel sounds are normal.     Palpations: Abdomen is soft.  Musculoskeletal:     Right lower leg: No edema.     Left lower leg: No edema.    Studies Reviewed: [name]    ECHOCARDIOGRAM COMPLETE 12/11/2022  LVEF 45 to 50% with abnormal septal motion consistent with post CABG. Mildly reduced RV systolic function. Prosthetic aortic  valve with normal gradients.  Lower Extremity Arterial Duplex 03/17/2024 Right: Stenosis is noted within the Right proximal SFA stent. Patent stent  with no evidence of stenosis in the superficial femoral artery artery.  Right peroneal artery not well visualized.  Left: Patent stent with no evidence of stenosis in the superficial femoral artery.  Below-knee small vessels reveal monophasic waveforms. Resting right ankle-brachial index indicates noncompressible right lower extremity arteries.  Resting left ankle-brachial index indicates noncompressible left lower extremity arteries. Monophasic waveforms at the ankle.   EKG:         Medications and allergies    No Known Allergies   Current Outpatient Medications:    amiodarone (PACERONE) 200 MG tablet, Take 200 mg by mouth in the morning., Disp: , Rfl:    atorvastatin (LIPITOR) 80 MG tablet, Take 80 mg by mouth in the morning., Disp: , Rfl:    Blood Glucose Monitoring Suppl (ONETOUCH VERIO IQ SYSTEM) w/Device KIT, , Disp: , Rfl:    carvedilol (COREG) 6.25 MG tablet, Take 1.5 tablets (9.375 mg total) by mouth 2 (two) times daily., Disp: 270 tablet, Rfl: 3   ENTRESTO 24-26 MG, TAKE 1 TABLET BY MOUTH TWICE A DAY, Disp: 60 tablet, Rfl: 6   Evolocumab 140 MG/ML SOAJ, Inject 140 mg into the skin every 14 (fourteen) days., Disp: , Rfl:    furosemide (LASIX) 20 MG tablet, Take 2 tablets (40 mg total) by mouth daily. May take extra 20 mg as needed for wt gain, swelling, or increase shortness of breath., Disp: 200 tablet, Rfl: 3   glucose blood (ONETOUCH VERIO) test strip, , Disp: , Rfl:    glyBURIDE (DIABETA) 2.5 MG tablet, Take 5 mg by mouth 2 (two) times daily with a meal., Disp: , Rfl:    JARDIANCE 25 MG TABS tablet, Take 25 mg by mouth in the morning., Disp: , Rfl:    nitroGLYCERIN (NITROSTAT) 0.4 MG SL tablet, Place 1 tablet (0.4 mg total) under the tongue every 5 (five) minutes x 3 doses as needed for chest pain., Disp:  25 tablet, Rfl: 12    OneTouch Delica Lancets 33G MISC, , Disp: , Rfl:    spironolactone (ALDACTONE) 25 MG tablet, TAKE 1 TABLET BY MOUTH EVERY DAY, Disp: 90 tablet, Rfl: 3   UNABLE TO FIND, Med Name: Cefalexin  400 mg 1 tablet by mouth TID, Disp: , Rfl:    warfarin (COUMADIN) 5 MG tablet, TAKE 1 TO 1 AND 1/2 TABLETS BY MOUTH DAILY AS PRESCRIBED BY THE COUMADIN CLINIC., Disp: 135 tablet, Rfl: 1   No orders of the defined types were placed in this encounter.    There are no discontinued medications.   ASSESSMENT AND PLAN: .      ICD-10-CM   1. Critical limb ischemia of left lower extremity with rest pain (HCC)  I70.222     2. Coronary artery disease involving native coronary artery of native heart without angina pectoris  I25.10     3. H/O mechanical aortic valve replacement  Z95.2     4. Paroxysmal atrial fibrillation (HCC)  I48.0        1. Critical limb ischemia of left lower extremity with rest pain (HCC) (Primary) Patient with CLI, needs peripheral arteriogram.  Patient has extensive vascular history and patient is extremely ill although he appears clinically stable has multiple medical comorbidity.  I am concerned about severe small vessel disease and potentially may not be revascularizable, suspect disease at the level below the popliteal and above ankle small vessel disease.  Patient has had aortobifemoral bypass surgery, due to be hard if small vessel disease is found to perform angioplasty hence will need antegrade left femoral aortofemoral graft puncture for antegrade access.  This will also save contrast as patient has stage IIIb chronic kidney disease as well.  Patient has mechanical aortic valve, permanent atrial fibrillation, has very high chads vascular score and this makes the procedure more complex with risk of hematoma, bleeding and also development of renal failure and also potentially embolic complications as well.  All this issues were discussed in detail with the patient and patient wants  to proceed with peripheral arteriogram for limb salvage.  - CBC - Basic Metabolic Panel (BMET)  2. Coronary artery disease involving native coronary artery of native heart without angina pectoris As dictated above, patient with complex medical history, fortunately not having any active anginal symptoms and no clinical evidence of heart failure. - CBC - Basic Metabolic Panel (BMET)  3. H/O mechanical aortic valve replacement I had pharmacy see the patient today for Coumadin bridging and to discuss options. - CBC - Basic Metabolic Panel (BMET)  4. Paroxysmal atrial fibrillation (HCC) As dictated above, patient is high risk for cardioembolic complications.  Patient is presently on amiodarone for rate control, he is also being referred for atrial fibrillation ablation as he has failed multiple cardioversions. - CBC - Basic Metabolic Panel (BMET)   Will schedule for peripheral arteriogram and possible angioplasty given symptoms. Patient understands the risks, benefits, alternatives including medical therapy, CT angiography. Patient understands <3-5% risk of death, embolic complications, bleeding, infection, renal failure, urgent surgical revascularization, but not limited to these and wants to proceed.   Signed,  Yates Decamp, MD, [facility] 03/21/2024, 10:20 AM [facility] [address] [place], [place] [address] Phone: [phone]. Fax:  [phone]

## 2024-03-21 NOTE — H&P (View-Only) (Signed)
 Cardiology Office Note:  .   Date:  03/21/2024  ID:  [name] [name], [name] [name] January 18, 1946, MRN [id number] PCP: [place], Peter M, MD  [facility] Providers Cardiologist:  Janelle Mediate, MD   History of Present Illness: .    [name] [name] is a 78 y.o. male with a history of CAD s/p CABG in 2020, mechanical AVR on coumadin, PAF, heart failure with mildly reduced LVEF extensive PAD, DM, HTN, HLD, ITP, TIA, and recurrent GI bleed, last bleeding episode 3 years ago.  Patient previously managed by Dr. Lauro Portal from peripheral arterial disease standpoint, aortobifem bypass in 2013, bilateral iliac artery angioplasty in 2013 and multiple interventions to right SFA.  He has had a focal high-grade 80 to 90% stenosis in the left SFA but close in the absence of claudication.  He is now referred manage urgent basis to evaluate for critical limb ischemia and diastolic severe purplish discoloration of his toes and superficial eschar.   Patient states that he started having pain and discoloration of his left toes about 3 to 4 weeks ago.  He has been having constant discomfort in his feet as well.  No fever, no discharge, no open wounds although he has noticed black spots on his greater toe.  Labs   Lab Results  Component Value Date   CHOL 162 08/31/2023   HDL 58 08/31/2023   LDLCALC 79 08/31/2023   TRIG 124 08/31/2023   CHOLHDL 2.8 08/31/2023   Lab Results  Component Value Date   NA 137 03/02/2024   K 4.6 03/02/2024   CO2 25 03/02/2024   GLUCOSE 180 (H) 03/02/2024   BUN 37 (H) 03/02/2024   CREATININE 1.90 (H) 03/02/2024   CALCIUM 8.9 03/02/2024   GFRNONAA 36 (L) 03/02/2024      Latest Ref Rng & Units 03/02/2024   10:31 AM 02/21/2024    7:03 AM 01/17/2024    9:02 AM  BMP  Glucose 70 - 99 mg/dL 027  253  664   BUN 8 - 23 mg/dL 37  36  24   Creatinine 0.61 - 1.24 mg/dL 4.03  4.74  2.59   Sodium 135 - 145 mmol/L 137  141  138   Potassium 3.5 - 5.1 mmol/L 4.6  4.2  4.5    Chloride 98 - 111 mmol/L 101  103  101   CO2 22 - 32 mmol/L 25   27   Calcium 8.9 - 10.3 mg/dL 8.9   8.9       Latest Ref Rng & Units 03/02/2024   10:31 AM 02/21/2024    7:03 AM 01/17/2024    9:02 AM  CBC  WBC 4.0 - 10.5 K/uL 6.7   8.4   Hemoglobin 13.0 - 17.0 g/dL 56.3  87.5  64.3   Hematocrit 39.0 - 52.0 % 46.1  45.0  47.3   Platelets 150 - 400 K/uL 178   208    Lab Results  Component Value Date   TSH 4.593 (H) 03/02/2024     Review of Systems  Cardiovascular:  Positive for claudication (left leg). Negative for chest pain, dyspnea on exertion and leg swelling.  Skin:  Positive for nail changes (left foot) and poor wound healing (left foot).   Physical Exam:   VS:  There were no vitals taken for this visit.   Wt Readings from Last 3 Encounters:  03/02/24 171 lb 9.6 oz (77.8 kg)  02/08/24 172 lb 9.6 oz (78.3 kg)  01/27/24 172  lb 3.2 oz (78.1 kg)    Physical Exam Neck:     Vascular: No carotid bruit or JVD.  Cardiovascular:     Rate and Rhythm: Normal rate. Rhythm irregular.     Pulses:          Femoral pulses are 2+ on the right side and 2+ on the left side.      Popliteal pulses are 1+ on the right side and 1+ on the left side.       Dorsalis pedis pulses are 0 on the right side and 0 on the left side.       Posterior tibial pulses are 0 on the right side and 0 on the left side.     Heart sounds: Normal heart sounds. No murmur heard.    No gallop.     Comments: Crisp mechanical S2 heard. See enclosed image of the left foot Pulmonary:     Effort: Pulmonary effort is normal.     Breath sounds: Normal breath sounds.  Abdominal:     General: Bowel sounds are normal.     Palpations: Abdomen is soft.  Musculoskeletal:     Right lower leg: No edema.     Left lower leg: No edema.    Studies Reviewed: [name]    ECHOCARDIOGRAM COMPLETE 12/11/2022  LVEF 45 to 50% with abnormal septal motion consistent with post CABG. Mildly reduced RV systolic function. Prosthetic aortic  valve with normal gradients.  Lower Extremity Arterial Duplex 03/17/2024 Right: Stenosis is noted within the Right proximal SFA stent. Patent stent  with no evidence of stenosis in the superficial femoral artery artery.  Right peroneal artery not well visualized.  Left: Patent stent with no evidence of stenosis in the superficial femoral artery.  Below-knee small vessels reveal monophasic waveforms. Resting right ankle-brachial index indicates noncompressible right lower extremity arteries.  Resting left ankle-brachial index indicates noncompressible left lower extremity arteries. Monophasic waveforms at the ankle.   EKG:         Medications and allergies    No Known Allergies   Current Outpatient Medications:    amiodarone (PACERONE) 200 MG tablet, Take 200 mg by mouth in the morning., Disp: , Rfl:    atorvastatin (LIPITOR) 80 MG tablet, Take 80 mg by mouth in the morning., Disp: , Rfl:    Blood Glucose Monitoring Suppl (ONETOUCH VERIO IQ SYSTEM) w/Device KIT, , Disp: , Rfl:    carvedilol (COREG) 6.25 MG tablet, Take 1.5 tablets (9.375 mg total) by mouth 2 (two) times daily., Disp: 270 tablet, Rfl: 3   ENTRESTO 24-26 MG, TAKE 1 TABLET BY MOUTH TWICE A DAY, Disp: 60 tablet, Rfl: 6   Evolocumab 140 MG/ML SOAJ, Inject 140 mg into the skin every 14 (fourteen) days., Disp: , Rfl:    furosemide (LASIX) 20 MG tablet, Take 2 tablets (40 mg total) by mouth daily. May take extra 20 mg as needed for wt gain, swelling, or increase shortness of breath., Disp: 200 tablet, Rfl: 3   glucose blood (ONETOUCH VERIO) test strip, , Disp: , Rfl:    glyBURIDE (DIABETA) 2.5 MG tablet, Take 5 mg by mouth 2 (two) times daily with a meal., Disp: , Rfl:    JARDIANCE 25 MG TABS tablet, Take 25 mg by mouth in the morning., Disp: , Rfl:    nitroGLYCERIN (NITROSTAT) 0.4 MG SL tablet, Place 1 tablet (0.4 mg total) under the tongue every 5 (five) minutes x 3 doses as needed for chest pain., Disp:  25 tablet, Rfl: 12    OneTouch Delica Lancets 33G MISC, , Disp: , Rfl:    spironolactone (ALDACTONE) 25 MG tablet, TAKE 1 TABLET BY MOUTH EVERY DAY, Disp: 90 tablet, Rfl: 3   UNABLE TO FIND, Med Name: Cefalexin  400 mg 1 tablet by mouth TID, Disp: , Rfl:    warfarin (COUMADIN) 5 MG tablet, TAKE 1 TO 1 AND 1/2 TABLETS BY MOUTH DAILY AS PRESCRIBED BY THE COUMADIN CLINIC., Disp: 135 tablet, Rfl: 1   No orders of the defined types were placed in this encounter.    There are no discontinued medications.   ASSESSMENT AND PLAN: .      ICD-10-CM   1. Critical limb ischemia of left lower extremity with rest pain (HCC)  I70.222     2. Coronary artery disease involving native coronary artery of native heart without angina pectoris  I25.10     3. H/O mechanical aortic valve replacement  Z95.2     4. Paroxysmal atrial fibrillation (HCC)  I48.0        1. Critical limb ischemia of left lower extremity with rest pain (HCC) (Primary) Patient with CLI, needs peripheral arteriogram.  Patient has extensive vascular history and patient is extremely ill although he appears clinically stable has multiple medical comorbidity.  I am concerned about severe small vessel disease and potentially may not be revascularizable, suspect disease at the level below the popliteal and above ankle small vessel disease.  Patient has had aortobifemoral bypass surgery, due to be hard if small vessel disease is found to perform angioplasty hence will need antegrade left femoral aortofemoral graft puncture for antegrade access.  This will also save contrast as patient has stage IIIb chronic kidney disease as well.  Patient has mechanical aortic valve, permanent atrial fibrillation, has very high chads vascular score and this makes the procedure more complex with risk of hematoma, bleeding and also development of renal failure and also potentially embolic complications as well.  All this issues were discussed in detail with the patient and patient wants  to proceed with peripheral arteriogram for limb salvage.  - CBC - Basic Metabolic Panel (BMET)  2. Coronary artery disease involving native coronary artery of native heart without angina pectoris As dictated above, patient with complex medical history, fortunately not having any active anginal symptoms and no clinical evidence of heart failure. - CBC - Basic Metabolic Panel (BMET)  3. H/O mechanical aortic valve replacement I had pharmacy see the patient today for Coumadin bridging and to discuss options. - CBC - Basic Metabolic Panel (BMET)  4. Paroxysmal atrial fibrillation (HCC) As dictated above, patient is high risk for cardioembolic complications.  Patient is presently on amiodarone for rate control, he is also being referred for atrial fibrillation ablation as he has failed multiple cardioversions. - CBC - Basic Metabolic Panel (BMET)   Will schedule for peripheral arteriogram and possible angioplasty given symptoms. Patient understands the risks, benefits, alternatives including medical therapy, CT angiography. Patient understands <3-5% risk of death, embolic complications, bleeding, infection, renal failure, urgent surgical revascularization, but not limited to these and wants to proceed.   Signed,  Yates Decamp, MD, [facility] 03/21/2024, 10:20 AM [facility] [address] [place], [place] [address] Phone: [phone]. Fax:  [phone]

## 2024-03-22 LAB — CBC
Hematocrit: 50.4 % (ref 37.5–51.0)
Hemoglobin: 16.4 g/dL (ref 13.0–17.7)
MCH: 31.2 pg (ref 26.6–33.0)
MCHC: 32.5 g/dL (ref 31.5–35.7)
MCV: 96 fL (ref 79–97)
Platelets: 216 10*3/uL (ref 150–450)
RBC: 5.26 x10E6/uL (ref 4.14–5.80)
RDW: 14.3 % (ref 11.6–15.4)
WBC: 8 10*3/uL (ref 3.4–10.8)

## 2024-03-22 LAB — BASIC METABOLIC PANEL WITH GFR
BUN/Creatinine Ratio: 22 (ref 10–24)
BUN: 42 mg/dL — ABNORMAL HIGH (ref 8–27)
CO2: 23 mmol/L (ref 20–29)
Calcium: 9.3 mg/dL (ref 8.6–10.2)
Chloride: 98 mmol/L (ref 96–106)
Creatinine, Ser: 1.88 mg/dL — ABNORMAL HIGH (ref 0.76–1.27)
Glucose: 206 mg/dL — ABNORMAL HIGH (ref 70–99)
Potassium: 5.2 mmol/L (ref 3.5–5.2)
Sodium: 138 mmol/L (ref 134–144)
eGFR: 36 mL/min/{1.73_m2} — ABNORMAL LOW (ref >59)

## 2024-03-23 ENCOUNTER — Telehealth: Payer: Self-pay | Admitting: *Deleted

## 2024-03-23 NOTE — Telephone Encounter (Signed)
 Abdominal aortogram  scheduled at [facility] for: Friday March 24, 2024 12 Noon Arrival time [facility] Main Entrance A at: 7 AM  Nothing to eat after midnight prior to procedure, clear liquids until 5 AM day of procedure.  Medication instructions: -Hold:  Warfarin/Lovenox bridge-no warfarin starting 03/21/24-pt reports he is following instructions given to him by Anti-coag 03/21/24  Spironolactone/Lasix/Entresto-day before and day of procedure- per protocol GFR < 60 (36)  Jardiance/Glyburide-AM of procedure -Other usual morning medications can be taken with sips of water including aspirin 81 mg.  Plan to go home the same day, you will only stay overnight if medically necessary.  You must have responsible adult to drive you home.  Someone must be with you the first 24 hours after you arrive home.  Reviewed procedure instructions with patient.

## 2024-03-24 ENCOUNTER — Ambulatory Visit (HOSPITAL_COMMUNITY)
Admission: RE | Admit: 2024-03-24 | Discharge: 2024-03-24 | Disposition: A | Discharge status: Home / Self Care | Attending: Cardiology | Admitting: Cardiology

## 2024-03-24 ENCOUNTER — Encounter (HOSPITAL_COMMUNITY)
Admission: RE | Disposition: A | Discharge status: Home / Self Care | Payer: Self-pay | Source: Home / Self Care | Attending: Cardiology

## 2024-03-24 DIAGNOSIS — Z7901 Long term (current) use of anticoagulants: Secondary | ICD-10-CM | POA: Insufficient documentation

## 2024-03-24 DIAGNOSIS — I480 Paroxysmal atrial fibrillation: Secondary | ICD-10-CM | POA: Insufficient documentation

## 2024-03-24 DIAGNOSIS — Z01812 Encounter for preprocedural laboratory examination: Secondary | ICD-10-CM

## 2024-03-24 DIAGNOSIS — I70222 Atherosclerosis of native arteries of extremities with rest pain, left leg: Secondary | ICD-10-CM | POA: Insufficient documentation

## 2024-03-24 DIAGNOSIS — I2510 Atherosclerotic heart disease of native coronary artery without angina pectoris: Secondary | ICD-10-CM | POA: Diagnosis not present

## 2024-03-24 DIAGNOSIS — Z952 Presence of prosthetic heart valve: Secondary | ICD-10-CM | POA: Insufficient documentation

## 2024-03-24 DIAGNOSIS — I359 Nonrheumatic aortic valve disorder, unspecified: Secondary | ICD-10-CM

## 2024-03-24 HISTORY — PX: LOWER EXTREMITY ANGIOGRAPHY: CATH118251

## 2024-03-24 HISTORY — PX: LOWER EXTREMITY INTERVENTION: CATH118252

## 2024-03-24 LAB — PROTIME-INR
INR: 1.4 — ABNORMAL HIGH (ref 0.8–1.2)
Prothrombin Time: 17.2 s — ABNORMAL HIGH (ref 11.4–15.2)

## 2024-03-24 LAB — POCT ACTIVATED CLOTTING TIME
Activated Clotting Time: 228 s
Activated Clotting Time: 222 s

## 2024-03-24 LAB — GLUCOSE, CAPILLARY
Glucose-Capillary: 137 mg/dL — ABNORMAL HIGH (ref 70–99)
Glucose-Capillary: 104 mg/dL — ABNORMAL HIGH (ref 70–99)

## 2024-03-24 SURGERY — LOWER EXTREMITY ANGIOGRAPHY
Anesthesia: LOCAL

## 2024-03-24 MED ORDER — HYDRALAZINE HCL 20 MG/ML IJ SOLN
5.0000 mg | INTRAMUSCULAR | Status: DC | PRN
  Administered 2024-03-24: 5 mg via INTRAVENOUS

## 2024-03-24 MED ORDER — NITROGLYCERIN 1 MG/10 ML FOR IR/CATH LAB
INTRA_ARTERIAL | Status: DC | PRN
  Administered 2024-03-24: 400 ug via INTRA_ARTERIAL
  Administered 2024-03-24: 700 ug via INTRA_ARTERIAL

## 2024-03-24 MED ORDER — LIDOCAINE HCL (PF) 1 % IJ SOLN
INTRAMUSCULAR | Status: DC | PRN
Start: 2024-03-24 — End: 2024-03-26
  Administered 2024-03-24: 2 mL via SUBCUTANEOUS
  Administered 2024-03-24: 10 mL via SUBCUTANEOUS

## 2024-03-24 MED ORDER — MIDAZOLAM HCL 2 MG/2ML IJ SOLN
INTRAMUSCULAR | Status: AC
  Filled 2024-03-24: qty 2

## 2024-03-24 MED ORDER — MIDAZOLAM HCL 2 MG/2ML IJ SOLN
INTRAMUSCULAR | Status: DC | PRN
  Administered 2024-03-24: 2 mg via INTRAVENOUS

## 2024-03-24 MED ORDER — ONDANSETRON HCL 4 MG/2ML IJ SOLN: 4.0000 mg | Freq: Four times a day (QID) | INTRAMUSCULAR | Status: DC | PRN

## 2024-03-24 MED ORDER — FENTANYL CITRATE (PF) 100 MCG/2ML IJ SOLN
INTRAMUSCULAR | Status: AC
  Filled 2024-03-24: qty 2

## 2024-03-24 MED ORDER — HEPARIN SODIUM (PORCINE) 1000 UNIT/ML IJ SOLN
INTRAMUSCULAR | Status: AC
Start: 2024-03-24 — End: ?
  Filled 2024-03-24: qty 10

## 2024-03-24 MED ORDER — CEFAZOLIN SODIUM-DEXTROSE 2-4 GM/100ML-% IV SOLN
INTRAVENOUS | Status: AC
  Filled 2024-03-24: qty 100

## 2024-03-24 MED ORDER — FENTANYL CITRATE (PF) 100 MCG/2ML IJ SOLN
INTRAMUSCULAR | Status: DC | PRN
  Administered 2024-03-24: 25 ug via INTRAVENOUS
  Administered 2024-03-24 (×2): 50 ug via INTRAVENOUS
  Administered 2024-03-24: 25 ug via INTRAVENOUS
  Administered 2024-03-24: 50 ug via INTRAVENOUS

## 2024-03-24 MED ORDER — CLOPIDOGREL BISULFATE 75 MG PO TABS
300.0000 mg | ORAL_TABLET | Freq: Once | ORAL | Status: AC
  Administered 2024-03-24: 300 mg via ORAL

## 2024-03-24 MED ORDER — CLOPIDOGREL BISULFATE 300 MG PO TABS
ORAL_TABLET | ORAL | Status: AC
  Filled 2024-03-24: qty 1

## 2024-03-24 MED ORDER — LIDOCAINE-EPINEPHRINE 1 %-1:100000 IJ SOLN
INTRAMUSCULAR | Status: AC
  Filled 2024-03-24: qty 1

## 2024-03-24 MED ORDER — NITROGLYCERIN 1 MG/10 ML FOR IR/CATH LAB
INTRA_ARTERIAL | Status: AC
  Filled 2024-03-24: qty 10

## 2024-03-24 MED ORDER — SODIUM CHLORIDE 0.9 % WEIGHT BASED INFUSION
1.0000 mL/kg/h | INTRAVENOUS | Status: AC
  Administered 2024-03-24: 1 mL/kg/h via INTRAVENOUS

## 2024-03-24 MED ORDER — SODIUM CHLORIDE 0.9 % WEIGHT BASED INFUSION: 1.0000 mL/kg/h | INTRAVENOUS | Status: DC

## 2024-03-24 MED ORDER — IODIXANOL 320 MG/ML IV SOLN
INTRAVENOUS | Status: DC | PRN
  Administered 2024-03-24: 40 mL via INTRA_ARTERIAL

## 2024-03-24 MED ORDER — HYDRALAZINE HCL 20 MG/ML IJ SOLN
INTRAMUSCULAR | Status: AC
  Filled 2024-03-24: qty 1

## 2024-03-24 MED ORDER — CLOPIDOGREL BISULFATE 75 MG PO TABS: 75.0000 mg | ORAL_TABLET | Freq: Every day | ORAL | Status: DC

## 2024-03-24 MED ORDER — HEPARIN SODIUM (PORCINE) 1000 UNIT/ML IJ SOLN
INTRAMUSCULAR | Status: DC | PRN
  Administered 2024-03-24 (×3): 3000 [IU] via INTRAVENOUS
  Administered 2024-03-24: 4000 [IU] via INTRAVENOUS

## 2024-03-24 MED ORDER — HYDRALAZINE HCL 20 MG/ML IJ SOLN
INTRAMUSCULAR | Status: DC | PRN
  Administered 2024-03-24: 10 mg via INTRAVENOUS

## 2024-03-24 MED ORDER — LIDOCAINE HCL (PF) 1 % IJ SOLN
INTRAMUSCULAR | Status: AC
  Filled 2024-03-24: qty 30

## 2024-03-24 MED ORDER — HEPARIN SODIUM (PORCINE) 1000 UNIT/ML IJ SOLN
INTRAMUSCULAR | Status: AC
  Filled 2024-03-24: qty 10

## 2024-03-24 MED ORDER — FENTANYL CITRATE (PF) 100 MCG/2ML IJ SOLN
INTRAMUSCULAR | Status: AC
Start: 2024-03-24 — End: ?
  Filled 2024-03-24: qty 2

## 2024-03-24 MED ORDER — ENOXAPARIN SODIUM 120 MG/0.8ML IJ SOSY: 120.0000 mg | PREFILLED_SYRINGE | INTRAMUSCULAR | 0 refills | Status: DC

## 2024-03-24 MED ORDER — ACETAMINOPHEN 325 MG PO TABS: 650.0000 mg | ORAL_TABLET | ORAL | Status: DC | PRN

## 2024-03-24 MED ORDER — HEPARIN (PORCINE) IN NACL 1000-0.9 UT/500ML-% IV SOLN
INTRAVENOUS | Status: DC | PRN
  Administered 2024-03-24 (×2): 500 mL

## 2024-03-24 MED ORDER — CEFAZOLIN SODIUM-DEXTROSE 2-3 GM-%(50ML) IV SOLR
INTRAVENOUS | Status: DC | PRN
  Administered 2024-03-24: 2 g via INTRAVENOUS

## 2024-03-24 MED ORDER — SODIUM CHLORIDE 0.9 % WEIGHT BASED INFUSION
3.0000 mL/kg/h | INTRAVENOUS | Status: AC
  Administered 2024-03-24: 3 mL/kg/h via INTRAVENOUS

## 2024-03-24 MED ORDER — ASPIRIN 81 MG PO CHEW: 81.0000 mg | CHEWABLE_TABLET | ORAL | Status: DC

## 2024-03-24 MED ORDER — WARFARIN SODIUM 5 MG PO TABS: ORAL_TABLET | ORAL | Status: DC

## 2024-03-24 MED ORDER — CLOPIDOGREL BISULFATE 75 MG PO TABS: 75.0000 mg | ORAL_TABLET | Freq: Every day | ORAL | 2 refills | Status: DC

## 2024-03-24 SURGICAL SUPPLY — 21 items
BALLOON COYOTE OTW 1.5X40X150 (BALLOONS) ×1 IMPLANT
BALLOON COYOTE OTW 2X220X150 (BALLOONS) ×1 IMPLANT
BALLOON COYOTE OTW 2X60X150 (BALLOONS) ×1 IMPLANT
BALLOON COYOTE OTW 3X100X150 (BALLOONS) ×1 IMPLANT
BALLOON CYTE ES OTW 1.5X20X142 (BALLOONS) ×1 IMPLANT
CATH ANGIO 5F PIGTAIL 65CM (CATHETERS) ×1 IMPLANT
CATH CXI 2.3F 90 ANG 2 (CATHETERS) ×1 IMPLANT
CLOSURE PERCLOSE PROSTYLE (VASCULAR PRODUCTS) ×1 IMPLANT
COVER DOME SNAP 22 D (MISCELLANEOUS) ×1 IMPLANT
DEVICE TORQUE .025-.038 (MISCELLANEOUS) ×1 IMPLANT
GUIDEWIRE NITREX 0.018X80X5 (WIRE) ×1 IMPLANT
KIT ENCORE 26 ADVANTAGE (KITS) ×1 IMPLANT
KIT ESSENTIALS PG (KITS) ×1 IMPLANT
KIT MICROPUNCTURE NIT STIFF (SHEATH) ×1 IMPLANT
SET ATX-X65L (MISCELLANEOUS) ×1 IMPLANT
SHEATH CATAPULT 5F 45 MP (SHEATH) ×1 IMPLANT
SHEATH PINNACLE 5F 10CM (SHEATH) ×1 IMPLANT
SHEATH PROBE COVER 6X72 (BAG) ×1 IMPLANT
TRAY PV CATH (CUSTOM PROCEDURE TRAY) ×2 IMPLANT
WIRE HI TORQ COMMND ES.014X300 (WIRE) ×1 IMPLANT
WIRE HITORQ VERSACORE ST 145CM (WIRE) ×1 IMPLANT

## 2024-03-24 NOTE — Discharge Instructions (Signed)
 Femoral Site Care This sheet gives you information about how to care for yourself after your procedure. Your health care provider may also give you more specific instructions. If you have problems or questions, contact your health care provider. What can

## 2024-03-24 NOTE — Interval H&P Note (Signed)
 History and Physical Interval Note:  03/24/2024 11:24 AM  [name] [name]  has presented today for surgery, with the diagnosis of pad.  The various methods of treatment have been discussed with the patient and family. After consideration of risks, benefits and other options for treatment, the patient has consented to  Procedure(s): ABDOMINAL AORTOGRAM (N/A), lower extremity arteriogram and possible angioplasty as a surgical intervention for critical limb ischemia.  The patient's history has been reviewed, patient examined, no change in status, stable for surgery.  I have reviewed the patient's chart and labs.  Questions were answered to the patient's satisfaction.     Knox Perl

## 2024-03-27 MED FILL — Clopidogrel Bisulfate Tab 300 MG (Base Equiv): ORAL | Qty: 1 | Status: CN

## 2024-03-27 MED FILL — Clopidogrel Bisulfate Tab 300 MG (Base Equiv): ORAL | Qty: 1 | Status: AC

## 2024-03-27 MED FILL — Lidocaine Inj 1% w/ Epinephrine-1:100000: INTRAMUSCULAR | Qty: 20 | Status: CN

## 2024-03-27 MED FILL — Lidocaine Inj 1% w/ Epinephrine-1:100000: INTRAMUSCULAR | Qty: 20 | Status: AC

## 2024-03-28 ENCOUNTER — Telehealth: Payer: Self-pay | Admitting: *Deleted

## 2024-03-28 DIAGNOSIS — I739 Peripheral vascular disease, unspecified: Secondary | ICD-10-CM

## 2024-03-28 NOTE — Telephone Encounter (Signed)
 Dr Berry Bristol would like patient to have ABI's within the next 30 days.  Patient will follow up with Dr Berry Bristol.  Appointment scheduled for May 22 at 11:40 with Dr Berry Bristol

## 2024-03-29 ENCOUNTER — Ambulatory Visit: Attending: Cardiovascular Disease

## 2024-03-29 ENCOUNTER — Encounter (HOSPITAL_COMMUNITY): Payer: Self-pay | Admitting: Cardiology

## 2024-03-29 DIAGNOSIS — Z5181 Encounter for therapeutic drug level monitoring: Secondary | ICD-10-CM

## 2024-03-29 DIAGNOSIS — G459 Transient cerebral ischemic attack, unspecified: Secondary | ICD-10-CM | POA: Diagnosis not present

## 2024-03-29 DIAGNOSIS — I359 Nonrheumatic aortic valve disorder, unspecified: Secondary | ICD-10-CM

## 2024-03-29 LAB — POCT INR: INR: 3.4 — AB (ref 2.0–3.0)

## 2024-03-29 NOTE — Patient Instructions (Addendum)
 Description   Stop Lovenox  injections.  Continue on same dosage of Warfarin 5mg  daily.  Recheck in 2 weeks.  Coumadin  Clinic for any changes in medications or upcoming procedures.  Coumadin  Clinic [phone] *Amio restarted in Feb, 200mg  daily*

## 2024-04-04 ENCOUNTER — Other Ambulatory Visit (HOSPITAL_COMMUNITY): Payer: Self-pay | Admitting: Cardiology

## 2024-04-10 DIAGNOSIS — N1832 Chronic kidney disease, stage 3b: Secondary | ICD-10-CM | POA: Diagnosis not present

## 2024-04-10 DIAGNOSIS — B029 Zoster without complications: Secondary | ICD-10-CM | POA: Diagnosis not present

## 2024-04-10 DIAGNOSIS — R21 Rash and other nonspecific skin eruption: Secondary | ICD-10-CM | POA: Diagnosis not present

## 2024-04-12 ENCOUNTER — Ambulatory Visit: Attending: Cardiovascular Disease | Admitting: *Deleted

## 2024-04-12 DIAGNOSIS — Z5181 Encounter for therapeutic drug level monitoring: Secondary | ICD-10-CM

## 2024-04-12 DIAGNOSIS — I359 Nonrheumatic aortic valve disorder, unspecified: Secondary | ICD-10-CM | POA: Diagnosis not present

## 2024-04-12 DIAGNOSIS — G459 Transient cerebral ischemic attack, unspecified: Secondary | ICD-10-CM | POA: Diagnosis not present

## 2024-04-12 LAB — POCT INR: INR: 4.2 — AB (ref 2.0–3.0)

## 2024-04-12 NOTE — Patient Instructions (Signed)
 Description   Do not take any warfarin today then continue taking Warfarin 5mg  daily.  Recheck in 2 weeks.  Coumadin  Clinic for any changes in medications or upcoming procedures.  Coumadin  Clinic [phone] *Amio restarted in Feb, 200mg  daily*

## 2024-04-13 ENCOUNTER — Ambulatory Visit (HOSPITAL_COMMUNITY)
Admission: RE | Admit: 2024-04-13 | Discharge: 2024-04-13 | Disposition: A | Discharge status: Home / Self Care | Source: Ambulatory Visit | Attending: Cardiology | Admitting: Cardiology

## 2024-04-13 DIAGNOSIS — I739 Peripheral vascular disease, unspecified: Secondary | ICD-10-CM | POA: Diagnosis not present

## 2024-04-15 ENCOUNTER — Other Ambulatory Visit (HOSPITAL_COMMUNITY): Payer: Self-pay | Admitting: Cardiology

## 2024-04-16 ENCOUNTER — Encounter: Payer: Self-pay | Admitting: Cardiology

## 2024-04-16 LAB — VAS US ABI WITH/WO TBI

## 2024-04-16 NOTE — Progress Notes (Signed)
Discuss on OV soon

## 2024-04-26 ENCOUNTER — Other Ambulatory Visit: Payer: Self-pay

## 2024-04-26 ENCOUNTER — Encounter (HOSPITAL_COMMUNITY): Payer: Self-pay

## 2024-04-26 ENCOUNTER — Encounter (HOSPITAL_COMMUNITY)

## 2024-04-26 ENCOUNTER — Ambulatory Visit (INDEPENDENT_AMBULATORY_CARE_PROVIDER_SITE_OTHER)
Admission: EM | Admit: 2024-04-26 | Discharge: 2024-04-26 | Disposition: A | Discharge status: Home / Self Care | Source: Home / Self Care

## 2024-04-26 ENCOUNTER — Emergency Department (HOSPITAL_COMMUNITY)
Admission: EM | Admit: 2024-04-26 | Discharge: 2024-04-26 | Disposition: A | Discharge status: Home / Self Care | Attending: Emergency Medicine | Admitting: Emergency Medicine

## 2024-04-26 ENCOUNTER — Encounter (HOSPITAL_COMMUNITY): Payer: Self-pay | Admitting: Emergency Medicine

## 2024-04-26 DIAGNOSIS — E119 Type 2 diabetes mellitus without complications: Secondary | ICD-10-CM | POA: Diagnosis not present

## 2024-04-26 DIAGNOSIS — Z7984 Long term (current) use of oral hypoglycemic drugs: Secondary | ICD-10-CM | POA: Diagnosis not present

## 2024-04-26 DIAGNOSIS — R040 Epistaxis: Secondary | ICD-10-CM | POA: Insufficient documentation

## 2024-04-26 DIAGNOSIS — Z7902 Long term (current) use of antithrombotics/antiplatelets: Secondary | ICD-10-CM | POA: Diagnosis not present

## 2024-04-26 DIAGNOSIS — Z7901 Long term (current) use of anticoagulants: Secondary | ICD-10-CM | POA: Insufficient documentation

## 2024-04-26 DIAGNOSIS — I130 Hypertensive heart and chronic kidney disease with heart failure and stage 1 through stage 4 chronic kidney disease, or unspecified chronic kidney disease: Secondary | ICD-10-CM | POA: Diagnosis not present

## 2024-04-26 LAB — PROTIME-INR
INR: 5.3 (ref 0.8–1.2)
Prothrombin Time: 48.8 s — ABNORMAL HIGH (ref 11.4–15.2)

## 2024-04-26 MED ORDER — OXYMETAZOLINE HCL 0.05 % NA SOLN
NASAL | Status: AC
  Filled 2024-04-26: qty 30

## 2024-04-26 NOTE — ED Notes (Signed)
 Patient is being discharged from the Urgent Care and sent to the Emergency Department via private vehicle with neighbor . Per E. White, PA, patient is in need of higher level of care due to epistaxis, failing treatment with Afrinx3 and silver nitrate. Patient is aware and verbalizes understanding of plan of care.  Vitals:   04/26/24 1418  BP: (!) 143/75  Pulse: 93  Resp: 16  Temp: 97.7 F (36.5 C)  SpO2: 93%

## 2024-04-26 NOTE — ED Provider Notes (Signed)
 [place] [facility] AT [facility] Provider Note   CSN: [id number] Arrival date & time: 04/26/24  1609     History  Chief Complaint  Patient presents with   Epistaxis    [name] [name] is a 78 y.o. male.  78 year old male who presents urgent care with complaints of right sided nosebleed.  He reports that he woke up this morning and his nose started bleeding after he blew his nose.  He denies any injury to the area.  He has been trying to apply pressure and putting tissue in his nose but it has not stopped.  He is on Coumadin  as well as Plavix .  His last INR was 2 weeks ago and was 4.2.  His therapeutic range is 2.5-3.5.  They did not change his dose at that time just had him hold for 1 day.  Currently asymptomatic.  He states after arriving to the emergency department his nosebleed had resolved.  It has been about an hour and a half since he has not had any nosebleed.  He is compliant with his home medicines. He is on Coumadin  for valve replacement.  The history is provided by the patient. No language interpreter was used.       Home Medications Prior to Admission medications   Medication Sig Start Date End Date Taking? Authorizing Provider  amiodarone  (PACERONE ) 200 MG tablet Take 200 mg by mouth in the morning.    [provider]  atorvastatin  (LIPITOR ) 80 MG tablet Take 80 mg by mouth in the morning.    [provider]  Blood Glucose Monitoring Suppl (ONETOUCH VERIO IQ SYSTEM) w/Device KIT  11/23/19   [provider]  carvedilol  (COREG ) 6.25 MG tablet Take 1.5 tablets (9.375 mg total) by mouth 2 (two) times daily. 12/29/23   Elmarie Hacking, FNP  clopidogrel  (PLAVIX ) 75 MG tablet Take 1 tablet (75 mg total) by mouth daily. 03/24/24 03/24/25  Knox Perl, MD  enoxaparin  (LOVENOX ) 120 MG/0.8ML injection Inject 0.8 mLs (120 mg total) into the skin daily. Start tomorrow 03/25/24 03/25/24   Knox Perl, MD  Evolocumab 140 MG/ML SOAJ  Inject 140 mg into the skin every 14 (fourteen) days.    [provider]  furosemide  (LASIX ) 20 MG tablet Take 2 tablets (40 mg total) by mouth daily. May take extra 20 mg as needed for wt gain, swelling, or increase shortness of breath. 03/02/24   Lee, [place], NP  glucose blood (ONETOUCH VERIO) test strip  02/04/15   [provider]  glyBURIDE  (DIABETA ) 2.5 MG tablet Take 5 mg by mouth 2 (two) times daily with a meal.    [provider]  JARDIANCE 25 MG TABS tablet Take 25 mg by mouth in the morning. 03/17/23   [provider]  nitroGLYCERIN  (NITROSTAT ) 0.4 MG SL tablet Place 1 tablet (0.4 mg total) under the tongue every 5 (five) minutes x 3 doses as needed for chest pain. 09/19/19   Mariah Shines, NP  OneTouch Delica Lancets 33G MISC  11/23/19   [provider]  sacubitril -valsartan  (ENTRESTO ) 24-26 MG TAKE 1 TABLET BY MOUTH TWICE A DAY 04/04/24   Lee, [place], NP  spironolactone  (ALDACTONE ) 25 MG tablet TAKE 1 TABLET BY MOUTH EVERY DAY 04/19/24   McLean, Dalton S, MD  warfarin (COUMADIN ) 5 MG tablet Take 1 to 1 and 1/2 tablets by mouth daily as prescribed by the coumadin  clinic. 03/24/24   Knox Perl, MD      Allergies  Patient has no known allergies.    Review of Systems   Review of Systems  Constitutional:  Negative for fever.  HENT:  Positive for nosebleeds. Negative for trouble swallowing.   Respiratory:  Negative for shortness of breath.   Cardiovascular:  Negative for chest pain.  Gastrointestinal:  Negative for abdominal pain.  All other systems reviewed and are negative.   Physical Exam Updated Vital Signs BP (!) 140/82 (BP Location: Left Arm)   Pulse 83   Temp 98.1 F (36.7 C) (Oral)   Resp 18   SpO2 96%  Physical Exam Vitals and nursing note reviewed.  Constitutional:      General: He is not in acute distress.    Appearance: Normal appearance. He is not ill-appearing.  HENT:     Head: Normocephalic and atraumatic.      Nose: Nose normal.  Eyes:     Conjunctiva/sclera: Conjunctivae normal.  Cardiovascular:     Rate and Rhythm: Normal rate and regular rhythm.  Pulmonary:     Effort: Pulmonary effort is normal. No respiratory distress.     Breath sounds: Normal breath sounds. No wheezing or rales.  Musculoskeletal:        General: No deformity.  Skin:    Findings: No rash.  Neurological:     Mental Status: He is alert.     ED Results / Procedures / Treatments   Labs (all labs ordered are listed, but only abnormal results are displayed) Labs Reviewed - No data to display  EKG None  Radiology No results found.  Procedures Procedures    Medications Ordered in ED Medications - No data to display  ED Course/ Medical Decision Making/ A&P                                 Medical Decision Making  78 year old male presents today for concern of epistaxis.  This resolved soon as he arrived to the emergency department.  He has been observed in the emergency department now for about 3 hours without reoccurrence.  He was a low oxygen saturation of 89% documented.  He denies any shortness of breath.  I did have the nurse ambulate the patient in the halls.  He ambulated a good distance without desaturation.  He denied any shortness of breath during ambulation.  Likely erroneous documentation.  His INR from urgent care from earlier resulted and it was 5.2.  I discussed with our pharmacist who recommended to hold Coumadin  for the next 2 days and have the INR rechecked.  He states he does have an appointment tomorrow morning.  I did advise him that it may be better to have this rechecked at least a couple days from today because it takes some time for it to reflect in the blood work. He is stable for discharge.  Discharged in stable condition. His blood pressure is elevated but states he has calcified vessels and automated blood pressures are always elevated and that shows better on the manual.  Advised nurse  to get a manual blood pressure reading.  Repeat blood pressure better.  Discharged in stable condition.  ENT referral given.  Final Clinical Impression(s) / ED Diagnoses Final diagnoses:  Epistaxis    Rx / DC Orders ED Discharge Orders     None         Lucina Sabal, PA-C 04/26/24 2316    Mozell Arias, MD 05/02/24 [phone]

## 2024-04-26 NOTE — ED Provider Notes (Addendum)
 [facility]    CSN: [id number] Arrival date & time: 04/26/24  1414      History   Chief Complaint Chief Complaint  Patient presents with   Epistaxis    HPI [name] [name] is a 78 y.o. male.   78 year old male who presents urgent care with complaints of right sided nosebleed.  He reports that he woke up this morning and his nose started bleeding.  He denies any injury to the area.  He has been trying to apply pressure and putting tissue in his nose but it has not stopped.  He is on Coumadin  as well as Plavix .  His last INR was 2 weeks ago and was 4.2.  His therapeutic range is 2.5-3.  They did not change his dose at that time just had him hold for 1 day.  He has not had any syncopal episodes but does report he is a tiny bit lightheaded a little while ago but unsure if that is from bleeding versus having to hold pressure on his nose.  He is not having any other symptoms of blood loss, no dark stools no vomiting no lightheadedness at current.   Epistaxis Associated symptoms: no cough, no fever and no sore throat     Past Medical History:  Diagnosis Date   Adenomatous colon polyp 09/1997   Anemia    Aortic stenosis    s/p [facility] mechanical AVR - Chronic Coumadin    Blood transfusion    "related to ITP"   CHF (congestive heart failure) (HCC)    COPD (chronic obstructive pulmonary disease) (HCC)    patient denies this dx on 01/27/21   Coronary artery disease    s/p cabg x 3 11/2003: lima-lad, seq vg to rpda and rpl   Diverticulitis of colon    Dysrhythmia    a-fib   GERD (gastroesophageal reflux disease)    patient states he has never had reflux   Heart murmur    History of radiation therapy 02/18/2021, 02/20/2021, 02/25/2021   SBRT to left lung     Dr Retta Caster   Hyperlipidemia    Hypertension    ITP (idiopathic thrombocytopenic purpura)    Lung cancer (HCC)    last week   Peripheral arterial disease (HCC)    a. history of aortobifemoral bypass  grafting by Dr. Ena Harries early b. LE angiography 04/22/2015 patent aortobifem graft, DES to R SFA   Peripheral vascular disease (HCC)    s/p Left external Iliac Artery stenting and subsequent left femoral endarterectomy 02/2011- post op course complicated by wound infxn req I&D 03/2011   Renal artery stenosis, native, bilateral (HCC)    a. bilateral renal artery stenosis by recent duplex ultrasound b. L renal artery stent 02/2015, R renal artery patent on angiogram   Stroke (HCC) 2015   TIA (transient ischemic attack) ~ 2013   Type II diabetes mellitus (HCC)     Patient Active Problem List   Diagnosis Date Noted   Acquired thrombophilia (HCC) 08/30/2023   Adenocarcinoma of left lung (HCC) 08/30/2023   Balance problem 08/30/2023   Cognitive changes 08/30/2023   Numbness of right hand 08/30/2023   Pinched nerve in neck 08/30/2023   Pulmonary infiltrate 03/17/2023   Confusion 05/25/2022   Squamous cell carcinoma of left lung (HCC) 02/19/2021   Nodule of upper lobe of left lung 02/19/2021   Cancer of lingula of lung (HCC) 02/06/2021   Diabetes mellitus type 2, controlled (HCC) 12/08/2020   Acute on  chronic heart failure with preserved ejection fraction (HFpEF) (HCC) 12/08/2020   Hypertensive urgency 12/08/2020   Chronic anticoagulation 12/08/2020   AKI (acute kidney injury) (HCC) 12/08/2020   Lung nodule 12/08/2020   Claudication in peripheral vascular disease (HCC) 02/29/2020   Chronic kidney disease, stage 3a (HCC) 02/20/2020   Non-pressure chronic ulcer of unspecified heel and midfoot with unspecified severity (HCC) 02/20/2020   Retinal defect 02/20/2020   Chronic diastolic heart failure (HCC) 02/20/2020   Paroxysmal atrial fibrillation (HCC) 10/30/2019   Acute systolic heart failure (HCC) 10/06/2019   Acute diastolic heart failure (HCC) 09/19/2019   History of GI bleed 09/19/2019   Unstable angina (HCC) 09/17/2019   NSTEMI (non-ST elevated myocardial infarction) (HCC) 09/16/2019   H/O  colonoscopy with polypectomy    Hematochezia    GI bleed 09/02/2019   Supratherapeutic INR 02/23/2019   Gastrointestinal hemorrhage with melena 02/23/2019   Acute blood loss anemia 02/23/2019   Symptomatic anemia 02/23/2019   Headache 01/06/2018   TIA (transient ischemic attack) 12/20/2017   Stroke-like symptoms 12/14/2017   Claudication (HCC) 10/01/2016   Chronic obstructive pulmonary disease (HCC) 01/14/2016   Diabetic renal disease (HCC) 01/14/2016   Hypertensive heart and chronic kidney disease without heart failure, with stage 1 through stage 4 chronic kidney disease, or unspecified chronic kidney disease 01/14/2016   Overweight 01/14/2016   Renal artery arteriosclerosis (HCC) 02/22/2015   Renal artery stenosis (HCC) 02/21/2015   Renal artery stenosis, native, bilateral (HCC) 02/08/2015   Male erectile disorder (CODE) 12/20/2014   Polyp of colon 12/20/2014   H/O mechanical aortic valve replacement 07/20/2014   Encounter for therapeutic drug monitoring 03/13/2014   Cerebral infarction (HCC) 05/05/2013   Aortoiliac occlusive disease (HCC) 01/10/2013   PVD (peripheral vascular disease) with claudication (HCC) 02/02/2012   Atherosclerosis of native arteries of extremity with intermittent claudication (HCC) 01/12/2012   Coronary artery disease    Aortic stenosis    Peripheral vascular disease (HCC)    Hypertension    Hyperlipidemia    Idiopathic thrombocytopenic purpura (HCC)    Unspecified hearing loss, bilateral 11/25/2011   Acquired absence of spleen 04/22/2011   Allergic rhinitis 10/22/2010   Presence of prosthetic heart valve 10/22/2010   Coronary atherosclerosis 10/21/2010   Type 2 diabetes mellitus with diabetic peripheral angiopathy without gangrene (HCC) 02/13/2010   Transient cerebral ischemia 10/10/2009   Gastro-esophageal reflux disease without esophagitis 10/04/2009   Insomnia, unspecified 10/04/2009   Rosacea, unspecified 10/04/2009   Diabetes (HCC) 10/22/2008    HYPERCHOLESTEROLEMIA 10/22/2008   Immune thrombocytopenic purpura (HCC) 10/22/2008   Essential hypertension 10/22/2008   Aortic valve disorder 10/22/2008   PVD (peripheral vascular disease) (HCC) 10/22/2008   DIVERTICULITIS, COLON 10/22/2008   History of colonic polyps 10/22/2008    Past Surgical History:  Procedure Laterality Date   ABDOMINAL AORTAGRAM N/A 12/16/2011   Procedure: ABDOMINAL Tommi Fraise;  Surgeon: Arnoldo Lapping, MD;  Location: [facility] CATH LAB;  Service: Cardiovascular;  Laterality: N/A;   ABDOMINAL AORTOGRAM W/LOWER EXTREMITY Right 02/29/2020   Procedure: ABDOMINAL AORTOGRAM W/LOWER EXTREMITY;  Surgeon: Avanell Leigh, MD;  [facility];  Service: Cardiovascular;  Laterality: Right;   ANGIOPLASTY / STENTING ILIAC     Left external Iliac Artery   AORTA - BILATERAL FEMORAL ARTERY BYPASS GRAFT  01/18/2012   Procedure: AORTA BIFEMORAL BYPASS GRAFT;  Surgeon: Ouida Bloom, MD;  Location: [facility] OR;  Service: Vascular;  Laterality: N/A;   AORTIC VALVE REPLACEMENT  ~ 2004   ATRIAL FIBRILLATION ABLATION  N/A 11/20/2020   Procedure: ATRIAL FIBRILLATION ABLATION;  Surgeon: Lei Pump, MD;  [facility];  Service: Cardiovascular;  Laterality: N/A;   BRONCHIAL BIOPSY  01/28/2021   Procedure: BRONCHIAL BIOPSIES;  Surgeon: Prudy Brownie, DO;  Location: [facility]OS[facility];  Service: Pulmonary;;   BRONCHIAL BRUSHINGS  01/28/2021   Procedure: BRONCHIAL BRUSHINGS;  Surgeon: Prudy Brownie, DO;  [facility];  Service: Pulmonary;;   BRONCHIAL NEEDLE ASPIRATION BIOPSY  01/28/2021   Procedure: BRONCHIAL NEEDLE ASPIRATION BIOPSIES;  Surgeon: Prudy Brownie, DO;  [facility];  Service: Pulmonary;;   BRONCHIAL WASHINGS  01/28/2021   Procedure: BRONCHIAL WASHINGS;  Surgeon: Prudy Brownie, DO;  [facility];  Service: Pulmonary;;   BRONCHIAL WASHINGS  03/19/2023   Procedure: BRONCHIAL WASHINGS;  Surgeon: Josiah Nigh, MD;   Location: [facility] ENDOSCOPY;  Service: Pulmonary;;   CARDIAC CATHETERIZATION  11/2003   /pt report 10/01/2016   CARDIAC VALVE REPLACEMENT  11/2003   aortic   CARDIOVERSION N/A 10/16/2019   Procedure: CARDIOVERSION;  Surgeon: Darlis Eisenmenger, MD;  Location: [facility] ENDOSCOPY;  Service: Cardiovascular;  Laterality: N/A;   CARDIOVERSION N/A 01/01/2021   Procedure: CARDIOVERSION;  Surgeon: Darlis Eisenmenger, MD;  Location: [facility] ENDOSCOPY;  Service: Cardiovascular;  Laterality: N/A;   CARDIOVERSION N/A 01/18/2024   Procedure: CARDIOVERSION;  Surgeon: Darlis Eisenmenger, MD;  Location: [facility] INVASIVE CV LAB;  Service: Cardiovascular;  Laterality: N/A;   CARDIOVERSION N/A 02/21/2024   Procedure: CARDIOVERSION;  Surgeon: Darlis Eisenmenger, MD;  Location: [facility] INVASIVE CV LAB;  Service: Cardiovascular;  Laterality: N/A;   CATARACT EXTRACTION W/ INTRAOCULAR LENS  IMPLANT, BILATERAL Bilateral    COLONOSCOPY     COLONOSCOPY WITH PROPOFOL  N/A 09/03/2019   Procedure: COLONOSCOPY WITH PROPOFOL ;  Surgeon: Albertina Hugger, MD;  Location: [facility] ENDOSCOPY;  Service: Gastroenterology;  Laterality: N/A;   CORONARY ARTERY BYPASS GRAFT  11/2003   Maximo Spar 04/21/2011   CORONARY ATHERECTOMY N/A 10/10/2019   Procedure: CORONARY ATHERECTOMY;  Surgeon: Arnoldo Lapping, MD;  Location: [facility] INVASIVE CV LAB;  Service: Cardiovascular;  Laterality: N/A;   CORONARY STENT INTERVENTION N/A 10/10/2019   Procedure: CORONARY STENT INTERVENTION;  Surgeon: Arnoldo Lapping, MD;  Location: [facility] INVASIVE CV LAB;  Service: Cardiovascular;  Laterality: N/A;   CORONARY/GRAFT ANGIOGRAPHY N/A 09/18/2019   Procedure: CORONARY/GRAFT ANGIOGRAPHY;  Surgeon: Avanell Leigh, MD;  Location: [facility] INVASIVE CV LAB;  Service: Cardiovascular;  Laterality: N/A;   ESOPHAGOGASTRODUODENOSCOPY (EGD) WITH PROPOFOL  N/A 02/27/2019   Procedure: ESOPHAGOGASTRODUODENOSCOPY (EGD) WITH PROPOFOL ;  Surgeon: Albertina Hugger, MD;  Location[facility];  Service: Endoscopy;  Laterality: N/A;    FIDUCIAL MARKER PLACEMENT  01/28/2021   Procedure: FIDUCIAL MARKER PLACEMENT;  Surgeon: Prudy Brownie, DO;  Location[facility];  Service: Pulmonary;;   HEMOSTASIS CLIP PLACEMENT  09/03/2019   Procedure: HEMOSTASIS CLIP PLACEMENT;  Surgeon: Albertina Hugger, MD;  Location: [facility] ENDOSCOPY;  Service: Gastroenterology;;   LOWER EXTREMITY ANGIOGRAM N/A 02/21/2015   Procedure: LOWER EXTREMITY ANGIOGRAM;  Surgeon: Avanell Leigh, MD;  Location: [facility] CATH LAB;  Service: Cardiovascular;  Laterality: N/A;   LOWER EXTREMITY ANGIOGRAPHY  03/24/2024   Procedure: Lower Extremity Angiography;  Surgeon: Knox Perl, MD;  Location: [facility] INVASIVE CV LAB;  Service: Cardiovascular;;   LOWER EXTREMITY INTERVENTION  03/24/2024   Procedure: LOWER EXTREMITY INTERVENTION;  Surgeon: Knox Perl, MD;  [facility];  Service: Cardiovascular;;   PERIPHERAL VASCULAR CATHETERIZATION N/A 04/22/2015  Procedure: Lower Extremity Angiography;  Surgeon: Avanell Leigh, MD;  Location: [facility] INVASIVE CV LAB;  Service: Cardiovascular;  Laterality: N/A;   PERIPHERAL VASCULAR CATHETERIZATION N/A 08/24/2016   Procedure: Lower Extremity Angiography;  Surgeon: Avanell Leigh, MD;  Location: [facility] INVASIVE CV LAB;  Service: Cardiovascular;  Laterality: N/A;   PERIPHERAL VASCULAR CATHETERIZATION Right 10/01/2016   Procedure: Peripheral Vascular Intervention - STENT;  Surgeon: Avanell Leigh, MD;  Location: [facility] INVASIVE CV LAB;  Service: Cardiovascular;  Laterality: Right;  Prox and MID SFA    PERIPHERAL VASCULAR INTERVENTION  02/29/2020   Procedure: PERIPHERAL VASCULAR INTERVENTION;  Surgeon: Avanell Leigh, MD;  [facility];  Service: Cardiovascular;;  Right SFA   POLYPECTOMY     RENAL ANGIOGRAM N/A 02/21/2015   Procedure: RENAL ANGIOGRAM;  Surgeon: Avanell Leigh, MD;  Location: [facility] CATH LAB;  Service: Cardiovascular;  Laterality: Bilateral; 6 mm x 12 mm long Herculink balloon expandable stent to the left  renal artery   RENAL ARTERY STENT Left 04/22/2015   dr berry   SPLENECTOMY  02/2003   Maximo Spar 04/21/2011   TEE WITHOUT CARDIOVERSION N/A 10/16/2019   Procedure: TRANSESOPHAGEAL ECHOCARDIOGRAM (TEE);  Surgeon: Darlis Eisenmenger, MD;  Location: [facility] ENDOSCOPY;  Service: Cardiovascular;  Laterality: N/A;   TEMPORARY PACEMAKER N/A 10/10/2019   Procedure: TEMPORARY PACEMAKER;  Surgeon: Arnoldo Lapping, MD;  Location: [facility] INVASIVE CV LAB;  Service: Cardiovascular;  Laterality: N/A;   TONSILLECTOMY  ~ 1952   TRANSESOPHAGEAL ECHOCARDIOGRAM (CATH LAB) N/A 01/18/2024   Procedure: TRANSESOPHAGEAL ECHOCARDIOGRAM;  Surgeon: Darlis Eisenmenger, MD;  Location: [facility] INVASIVE CV LAB;  Service: Cardiovascular;  Laterality: N/A;   VIDEO BRONCHOSCOPY Left 03/19/2023   Procedure: VIDEO BRONCHOSCOPY WITHOUT FLUORO;  Surgeon: Josiah Nigh, MD;  Location: [facility] ENDOSCOPY;  Service: Pulmonary;  Laterality: Left;   VIDEO BRONCHOSCOPY WITH ENDOBRONCHIAL NAVIGATION N/A 01/28/2021   Procedure: VIDEO BRONCHOSCOPY WITH ENDOBRONCHIAL NAVIGATION;  Surgeon: Prudy Brownie, DO;  Location: [facility] ENDOSCOPY;  Service: Pulmonary;  Laterality: N/A;       Home Medications    Prior to Admission medications   Medication Sig Start Date End Date Taking? Authorizing Provider  amiodarone  (PACERONE ) 200 MG tablet Take 200 mg by mouth in the morning.    [provider]  atorvastatin  (LIPITOR ) 80 MG tablet Take 80 mg by mouth in the morning.    [provider]  Blood Glucose Monitoring Suppl (ONETOUCH VERIO IQ SYSTEM) w/Device KIT  11/23/19   [provider]  carvedilol  (COREG ) 6.25 MG tablet Take 1.5 tablets (9.375 mg total) by mouth 2 (two) times daily. 12/29/23   Elmarie Hacking, FNP  clopidogrel  (PLAVIX ) 75 MG tablet Take 1 tablet (75 mg total) by mouth daily. 03/24/24 03/24/25  Knox Perl, MD  enoxaparin  (LOVENOX ) 120 MG/0.8ML injection Inject 0.8 mLs (120 mg total) into the skin daily. Start tomorrow 03/25/24 03/25/24    Knox Perl, MD  Evolocumab 140 MG/ML SOAJ Inject 140 mg into the skin every 14 (fourteen) days.    [provider]  furosemide  (LASIX ) 20 MG tablet Take 2 tablets (40 mg total) by mouth daily. May take extra 20 mg as needed for wt gain, swelling, or increase shortness of breath. 03/02/24   Lee, [place], NP  glucose blood (ONETOUCH VERIO) test strip  02/04/15   [provider]  glyBURIDE  (DIABETA ) 2.5 MG tablet Take 5 mg by mouth 2 (two) times daily with a meal.    [provider]  JARDIANCE 25 MG TABS tablet Take 25 mg by mouth in the morning. 03/17/23   [provider]  nitroGLYCERIN  (NITROSTAT ) 0.4 MG SL tablet Place 1 tablet (0.4 mg total) under the tongue every 5 (five) minutes x 3 doses as needed for chest pain. 09/19/19   Mariah Shines, NP  OneTouch Delica Lancets 33G MISC  11/23/19   [provider]  sacubitril -valsartan  (ENTRESTO ) 24-26 MG TAKE 1 TABLET BY MOUTH TWICE A DAY 04/04/24   Lee, [place], NP  spironolactone  (ALDACTONE ) 25 MG tablet TAKE 1 TABLET BY MOUTH EVERY DAY 04/19/24   McLean, Dalton S, MD  warfarin (COUMADIN ) 5 MG tablet Take 1 to 1 and 1/2 tablets by mouth daily as prescribed by the coumadin  clinic. 03/24/24   Knox Perl, MD    Family History Family History  Problem Relation Age of Onset   Coronary artery disease Mother        bypass surgery - deceased   Heart disease Father        murmur, valve replacement - deceased   Breast cancer Sister    Diabetes Other        grandmother   Diabetes Paternal Grandmother    Diabetes Paternal Aunt    Colon cancer Neg Hx    Colon polyps Neg Hx    Esophageal cancer Neg Hx    Rectal cancer Neg Hx    Stomach cancer Neg Hx     Social History Social History   Tobacco Use   Smoking status: Former    Current packs/day: 0.00    Average packs/day: 1.5 packs/day for 30.0 years (45.0 ttl pk-yrs)    Types: Cigarettes, Cigars    Start date: 12/16/1963    Quit date: 12/15/1993    Years since  quitting: 30.3   Smokeless tobacco: Never   Tobacco comments:    occasional cigar  Vaping Use   Vaping status: Never Used  Substance Use Topics   Alcohol  use: Yes    Alcohol /week: 15.0 standard drinks of alcohol     Types: 1 Cans of beer, 7 Shots of liquor, 7 Standard drinks or equivalent per week    Comment: drinks 1 martini's a night (2 shots)   Drug use: No     Allergies   Patient has no known allergies.   Review of Systems Review of Systems  Constitutional:  Negative for chills and fever.  HENT:  Positive for nosebleeds. Negative for ear pain and sore throat.   Eyes:  Negative for pain and visual disturbance.  Respiratory:  Negative for cough and shortness of breath.   Cardiovascular:  Negative for chest pain and palpitations.  Gastrointestinal:  Negative for abdominal pain and vomiting.  Genitourinary:  Negative for dysuria and hematuria.  Musculoskeletal:  Negative for arthralgias and back pain.  Skin:  Negative for color change and rash.  Neurological:  Negative for seizures and syncope.  All other systems reviewed and are negative.    Physical Exam Triage Vital Signs ED Triage Vitals  Encounter Vitals Group     BP 04/26/24 1418 (!) 143/75     Systolic BP Percentile --      Diastolic BP Percentile --      Pulse Rate 04/26/24 1418 93     Resp 04/26/24 1418 16     Temp 04/26/24 1418 97.7 F (36.5 C)     Temp Source 04/26/24 1418 Oral     SpO2 04/26/24 1418 93 %     Weight --  Height --      Head Circumference --      Peak Flow --      Pain Score 04/26/24 1419 0     Pain Loc --      Pain Education --      Exclude from Growth Chart --    No data found.  Updated Vital Signs BP (!) 143/75 (BP Location: Right Arm)   Pulse 93   Temp 97.7 F (36.5 C) (Oral)   Resp 16   SpO2 93%   Visual Acuity Right Eye Distance:   Left Eye Distance:   Bilateral Distance:    Right Eye Near:   Left Eye Near:    Bilateral Near:     Physical Exam Vitals and  nursing note reviewed.  Constitutional:      General: He is not in acute distress.    Appearance: He is well-developed.  HENT:     Head: Normocephalic and atraumatic.     Nose: No signs of injury or nasal tenderness.     Right Nostril: Epistaxis present. No septal hematoma.     Left Nostril: No epistaxis.     Mouth/Throat:     Mouth: Mucous membranes are moist.  Eyes:     Conjunctiva/sclera: Conjunctivae normal.  Cardiovascular:     Rate and Rhythm: Normal rate and regular rhythm.     Heart sounds: No murmur heard.    Comments: Heart valve audible Pulmonary:     Effort: Pulmonary effort is normal. No respiratory distress.     Breath sounds: Normal breath sounds.  Abdominal:     Palpations: Abdomen is soft.     Tenderness: There is no abdominal tenderness.  Musculoskeletal:        General: No swelling.     Cervical back: Neck supple.  Skin:    General: Skin is warm and dry.     Capillary Refill: Capillary refill takes less than 2 seconds.  Neurological:     General: No focal deficit present.     Mental Status: He is alert.  Psychiatric:        Mood and Affect: Mood normal.      UC Treatments / Results  Labs (all labs ordered are listed, but only abnormal results are displayed) Labs Reviewed  PROTIME-INR    EKG   Radiology No results found.  Procedures Procedures (including critical care time)  Medications Ordered in UC Medications - No data to display  Initial Impression / Assessment and Plan / UC Course  I have reviewed the triage vital signs and the nursing notes.  Pertinent labs & imaging results that were available during my care of the patient were reviewed by me and considered in my medical decision making (see chart for details).     Right-sided epistaxis  Long term current use of anticoagulant therapy   Patient with visible bleeding from the mucosa of the right nostril.  2 sprays of Afrin were applied and pressure was held for 5 continuous  minutes.  Once removed there was no bleeding initially but then a small amount recurred after the patient sat for about 5 to 10 minutes.  We applied another dose of Afrin.  Bleeding persisted and silver nitrate was applied to the area and pressure was held again.  Unfortunately bleeding has persisted.  The patient's INR is still pending but was 4.2 2 weeks ago and we feel that it is likely supratherapeutic at this point.  Since we are unable to  control the bleeding here we recommend further evaluation at the emergency department.  Patient is in agreement with this and will go now.  Final Clinical Impressions(s) / UC Diagnoses   Final diagnoses:  Right-sided epistaxis  Long term current use of anticoagulant therapy     Discharge Instructions      Epistaxis of the right nostril that has continued to bleed despite pressure, 3 administrations of Afrin and silver nitrate followed by pressure and packing.  INR is still pending but given the failure to control the bleeding here, we recommend further evaluation at the emergency department.   ED Prescriptions   None    PDMP not reviewed this encounter.   Kreg Pesa, PA-C 04/26/24 1602    Kreg Pesa, PA-C 04/26/24 1602

## 2024-04-26 NOTE — Discharge Instructions (Signed)
 Hold Coumadin  dose today and tomorrow.  Follow-up with the Coumadin  clinic to have the INR rechecked.  ENT referral given.  Return for any emergent symptoms. If you have another bleeding episode.  Will prescribe 20 minutes continuously before checking.  If you are unable to get this stop and are unable to get in with ENT return to the emergency department.

## 2024-04-26 NOTE — Discharge Instructions (Signed)
 Epistaxis of the right nostril that has continued to bleed despite pressure, 3 administrations of Afrin and silver nitrate followed by pressure and packing.  INR is still pending but given the failure to control the bleeding here, we recommend further evaluation at the emergency department.

## 2024-04-26 NOTE — ED Triage Notes (Signed)
 Pt arrived POC from UC for ongoing nose bleed since 0700 this morning. On Coumadin  and Plavix . Bleeding noted to right nostril. BP 150/79. Pt alert and oriented, ambulatory. Bleeding is controlled at this time.

## 2024-04-26 NOTE — ED Triage Notes (Signed)
 Patient here today with c/o a nose bleed since around 7 this morning. Patient states that it has been continuous. Patient is on blood thinners. Patient states that he has tried lying flat on his back for a couple hours with no relief.

## 2024-04-26 NOTE — ED Notes (Signed)
 Pt blew his nose at 0730 and his nose began to bleed. Pt was unable to control bleeding and went to urgent care. UC sent pt to ED. Bleeding controlled upon pt assessment.

## 2024-04-26 NOTE — ED Provider Triage Note (Signed)
 Emergency Medicine Provider Triage Evaluation Note  [name] [name] , a 78 y.o. male  was evaluated in triage.  Pt complains of epistaxis since this morning around 7 AM.  Currently on Plavix  along with Coumadin  due to a heart valve and aortic gram.  Went to urgent care, where they proceeded to try some Afrin x 2, silver nitrate, gauze packing.  Of note, no Rhino Rocket was ever placed.  Continues to ooze, does not taste the blood.  Review of Systems  Positive: Epistaxis Negative: Nausea, vomiting or headache  Physical Exam  BP (!) 150/79 (BP Location: Right Arm)   Pulse 90   Temp 98 F (36.7 C)   Resp 18   SpO2 99%  Gen:   Awake, no distress   Resp:  Normal effort  MSK:   Moves extremities without difficulty  Other:  Right nostril with residual blood present, no blood in the oropharynx visible  Medical Decision Making  Medically screening exam initiated at 4:37 PM.  Appropriate orders placed.  [name] [name] was informed that the remainder of the evaluation will be completed by another provider, this initial triage assessment does not replace that evaluation, and the importance of remaining in the ED until their evaluation is complete.     Makaelyn Aponte, PA-C 04/26/24 1641

## 2024-04-27 ENCOUNTER — Ambulatory Visit (HOSPITAL_COMMUNITY): Payer: Self-pay

## 2024-04-27 ENCOUNTER — Ambulatory Visit: Attending: Cardiology | Admitting: Cardiology

## 2024-04-27 ENCOUNTER — Encounter: Payer: Self-pay | Admitting: Cardiology

## 2024-04-27 ENCOUNTER — Ambulatory Visit: Attending: Cardiovascular Disease | Admitting: *Deleted

## 2024-04-27 VITALS — BP 146/94 | HR 77 | Ht 68.0 in | Wt 164.0 lb

## 2024-04-27 DIAGNOSIS — G459 Transient cerebral ischemic attack, unspecified: Secondary | ICD-10-CM | POA: Diagnosis not present

## 2024-04-27 DIAGNOSIS — I2510 Atherosclerotic heart disease of native coronary artery without angina pectoris: Secondary | ICD-10-CM | POA: Diagnosis not present

## 2024-04-27 DIAGNOSIS — I359 Nonrheumatic aortic valve disorder, unspecified: Secondary | ICD-10-CM | POA: Diagnosis not present

## 2024-04-27 DIAGNOSIS — Z5181 Encounter for therapeutic drug level monitoring: Secondary | ICD-10-CM | POA: Diagnosis not present

## 2024-04-27 DIAGNOSIS — I739 Peripheral vascular disease, unspecified: Secondary | ICD-10-CM

## 2024-04-27 DIAGNOSIS — I10 Essential (primary) hypertension: Secondary | ICD-10-CM

## 2024-04-27 DIAGNOSIS — I70222 Atherosclerosis of native arteries of extremities with rest pain, left leg: Secondary | ICD-10-CM

## 2024-04-27 LAB — POCT INR: POC INR: 5.3

## 2024-04-27 MED ORDER — CARVEDILOL 12.5 MG PO TABS: 12.5000 mg | ORAL_TABLET | Freq: Two times a day (BID) | ORAL | 0 refills | Status: DC

## 2024-04-27 MED ORDER — ASPIRIN 81 MG PO TBEC: 81.0000 mg | DELAYED_RELEASE_TABLET | Freq: Every day | ORAL | Status: AC

## 2024-04-27 NOTE — Progress Notes (Signed)
 Cardiology Office Note:  .   Date:  04/27/2024  ID:  [name], [name] [birth year], MRN [id number] PCP: Margarete Sharps, MD  [facility] Providers Cardiologist:  None   History of Present Illness: .   [name] [name] is a 78 y.o. male with a history of CAD s/p CABG in 2020, mechanical AVR on coumadin , PAF, heart failure with mildly reduced LVEF extensive PAD, DM, HTN, HLD, ITP, TIA, and recurrent GI bleed, last bleeding episode 3 years ago.   Patient previously managed by Dr. Lauro Portal from peripheral arterial disease standpoint, aortobifem bypass in 2013, bilateral iliac artery angioplasty in 2013 and multiple interventions to right SFA.  He has had a focal high-grade 80 to 90% stenosis in the left SFA but close in the absence of claudication.  He is now referred manage urgent basis to evaluate for critical limb ischemia and diastolic severe purplish discoloration of his toes and superficial eschar.  He underwent peripheral arteriogram and angioplasty to the occluded left anterior tibial artery via antegrade access through aortobifemoral graft access, inline flow was established and he now presents for follow-up.  States that he has noticed marked improvement in wound healing and also has not had any further resting leg pain.  Discussed the use of AI scribe software for clinical note transcription with the patient, who gave verbal consent to proceed.  History of Present Illness [name] [name] "[name] [name]" is a 78 year old male who presents for follow-up regarding gangrenous skin and recent epistaxis. Gangrenous skin is healing with black spots expected to fall off. He can bear more weight on his toes and walk better. Healing is slow but improving.  He experienced significant epistaxis after blowing his nose, leading to an emergency room visit. He is on Plavix , which may have contributed to the bleeding.  He takes carvedilol  6.25 mg, one and a half tablets daily. He  attends the heart failure clinic every three months and has an appointment next week. Occasional nerve pain occurs at night, but there is improvement compared to previous pain levels.   Labs   Lab Results  Component Value Date   CHOL 162 08/31/2023   HDL 58 08/31/2023   LDLCALC 79 08/31/2023   TRIG 124 08/31/2023   CHOLHDL 2.8 08/31/2023   Lab Results  Component Value Date   NA 138 03/21/2024   K 5.2 03/21/2024   CO2 23 03/21/2024   GLUCOSE 206 (H) 03/21/2024   BUN 42 (H) 03/21/2024   CREATININE 1.88 (H) 03/21/2024   CALCIUM  9.3 03/21/2024   EGFR 36 (L) 03/21/2024   GFRNONAA 36 (L) 03/02/2024      Latest Ref Rng & Units 03/21/2024    1:20 PM 03/02/2024   10:31 AM 02/21/2024    7:03 AM  BMP  Glucose 70 - 99 mg/dL 409  811  914   BUN 8 - 27 mg/dL 42  37  36   Creatinine 0.76 - 1.27 mg/dL 7.82  9.56  2.13   BUN/Creat Ratio 10 - 24 22     Sodium 134 - 144 mmol/L 138  137  141   Potassium 3.5 - 5.2 mmol/L 5.2  4.6  4.2   Chloride 96 - 106 mmol/L 98  101  103   CO2 20 - 29 mmol/L 23  25    Calcium  8.6 - 10.2 mg/dL 9.3  8.9        Latest Ref Rng & Units 03/21/2024    1:20 PM  03/02/2024   10:31 AM 02/21/2024    7:03 AM  CBC  WBC 3.4 - 10.8 x10E3/uL 8.0  6.7    Hemoglobin 13.0 - 17.7 g/dL 16.1  09.6  04.5   Hematocrit 37.5 - 51.0 % 50.4  46.1  45.0   Platelets 150 - 450 x10E3/uL 216  178     Lab Results  Component Value Date   HGBA1C 7.1 (H) 12/08/2020    Lab Results  Component Value Date   TSH 4.593 (H) 03/02/2024     ROS  Review of Systems  Cardiovascular:  Negative for chest pain, claudication, dyspnea on exertion and leg swelling.  Skin:  Positive for poor wound healing (Improving ulcer/eschar left foot/toes.).    Physical Exam:   VS:  BP (!) 146/94   Pulse 77   Ht 5\' 8"  (1.727 m)   Wt 164 lb (74.4 kg)   SpO2 95%   BMI 24.94 kg/m    Wt Readings from Last 3 Encounters:  04/27/24 164 lb (74.4 kg)  03/24/24 167 lb (75.8 kg)  03/21/24 169 lb 12.8 oz (77  kg)    Physical Exam Neck:     Vascular: No carotid bruit or JVD.  Cardiovascular:     Rate and Rhythm: Normal rate. Rhythm irregular.     Pulses:          Femoral pulses are 2+ on the right side and 2+ on the left side.      Popliteal pulses are 1+ on the right side and 1+ on the left side.       Dorsalis pedis pulses are 0 on the right side and 0 on the left side.       Posterior tibial pulses are 0 on the right side and 0 on the left side.     Heart sounds: Murmur heard.     Midsystolic murmur is present with a grade of 3/6 at the upper right sternal border radiating to the neck.     No gallop.     Comments: Crisp mechanical S2 heard. Pulmonary:     Effort: Pulmonary effort is normal.     Breath sounds: Normal breath sounds.  Abdominal:     General: Bowel sounds are normal.     Palpations: Abdomen is soft.  Musculoskeletal:        General: Deformity (Mild varus deformity of his toes, left greater toe and second toe with dry gangrene and eschar, small spots without any open wounds or discharge.) present.     Right lower leg: No edema.     Left lower leg: No edema.    Studies Reviewed: Aaron Aas    Peripheral arteriogram and angioplasty 03/24/2024:  Intervention data: Successful PTA and balloon angioplasty of the left AT. the entire AT had to be dilated as it was difficult to pass and advance balloons, through the proximal segment as well.  100% stenosis reduced to 0%.    EKG:         Medications and allergies    No Known Allergies   Current Outpatient Medications:    amiodarone  (PACERONE ) 200 MG tablet, Take 200 mg by mouth in the morning., Disp: , Rfl:    aspirin  EC 81 MG tablet, Take 1 tablet (81 mg total) by mouth daily., Disp: , Rfl:    atorvastatin  (LIPITOR ) 80 MG tablet, Take 80 mg by mouth in the morning., Disp: , Rfl:    Blood Glucose Monitoring Suppl (ONETOUCH VERIO IQ SYSTEM) w/Device KIT, ,  Disp: , Rfl:    enoxaparin  (LOVENOX ) 120 MG/0.8ML injection, Inject 0.8 mLs  (120 mg total) into the skin daily. Start tomorrow 03/25/24, Disp: 4.8 mL, Rfl: 0   Evolocumab 140 MG/ML SOAJ, Inject 140 mg into the skin every 14 (fourteen) days., Disp: , Rfl:    furosemide  (LASIX ) 20 MG tablet, Take 2 tablets (40 mg total) by mouth daily. May take extra 20 mg as needed for wt gain, swelling, or increase shortness of breath., Disp: 200 tablet, Rfl: 3   glucose blood (ONETOUCH VERIO) test strip, , Disp: , Rfl:    glyBURIDE  (DIABETA ) 2.5 MG tablet, Take 5 mg by mouth 2 (two) times daily with a meal., Disp: , Rfl:    JARDIANCE 25 MG TABS tablet, Take 25 mg by mouth in the morning., Disp: , Rfl:    nitroGLYCERIN  (NITROSTAT ) 0.4 MG SL tablet, Place 1 tablet (0.4 mg total) under the tongue every 5 (five) minutes x 3 doses as needed for chest pain., Disp: 25 tablet, Rfl: 12   OneTouch Delica Lancets 33G MISC, , Disp: , Rfl:    sacubitril -valsartan  (ENTRESTO ) 24-26 MG, TAKE 1 TABLET BY MOUTH TWICE A DAY, Disp: 180 tablet, Rfl: 3   spironolactone  (ALDACTONE ) 25 MG tablet, TAKE 1 TABLET BY MOUTH EVERY DAY, Disp: 90 tablet, Rfl: 3   warfarin (COUMADIN ) 5 MG tablet, Take 1 to 1 and 1/2 tablets by mouth daily as prescribed by the coumadin  clinic., Disp: , Rfl:    carvedilol  (COREG ) 12.5 MG tablet, Take 1 tablet (12.5 mg total) by mouth 2 (two) times daily., Disp: 180 tablet, Rfl: 0   Meds ordered this encounter  Medications   aspirin  EC 81 MG tablet    Sig: Take 1 tablet (81 mg total) by mouth daily.   carvedilol  (COREG ) 12.5 MG tablet    Sig: Take 1 tablet (12.5 mg total) by mouth 2 (two) times daily.    Dispense:  180 tablet    Refill:  0     Medications Discontinued During This Encounter  Medication Reason   clopidogrel  (PLAVIX ) 75 MG tablet Completed Course   carvedilol  (COREG ) 6.25 MG tablet      ASSESSMENT AND PLAN: .      ICD-10-CM   1. Critical limb ischemia of left lower extremity with rest pain (HCC)  I70.222 aspirin  EC 81 MG tablet    2. PAD (peripheral artery  disease) (HCC)  I73.9 aspirin  EC 81 MG tablet    3. Primary hypertension  I10 carvedilol  (COREG ) 12.5 MG tablet    4. Coronary artery disease involving native coronary artery of native heart without angina pectoris  I25.10 carvedilol  (COREG ) 12.5 MG tablet      Assessment and Plan Assessment & Plan Peripheral artery disease   Peripheral artery disease is well-managed with improved circulation and excellent wound healing and his left foot is now warm.  Advised him that collateral circulation would improve through walking. Encourage walking to enhance circulation.  Gangrenous skin   Gangrenous skin on the toes is healing well. Black spots are expected to fall off as the skin regenerates. Pain is improving, and he can bear more weight on the toes. Encourage walking to aid circulation and healing.  Epistaxis   A recent episode of epistaxis, likely related to Plavix  use, required emergency care. Discontinue Plavix  due to epistaxis and start baby aspirin  81 mg daily. If epistaxis recurs, discontinue baby aspirin .  He is presently on warfarin for mechanical aortic valve hence continue the  same.  Keep Afrin at home for potential future episodes. Educate on managing epistaxis, including Afrin use, applying pressure, and avoiding nose blowing for two days.  Hypertension   Blood pressure is elevated, possibly due to stress from epistaxis. Increase carvedilol  to 12.5 mg twice daily 18.5 mg twice daily dose. Send prescription to [facility].  Heart failure with mildly reduced EF and mechanical aortic valve Heart failure and valvular heart disease is managed by Dr Mitzie Anda at the heart failure clinic. No new issues reported. Continue follow-up with Dr. Mitzie Anda for heart failure management.  I will see him back on a as needed basis if he were to develop any Wounds on his feet or nonhealing or slow healing ulceration, or if you were to develop symptoms of claudication.   Signed,  Knox Perl, MD,  [facility] 04/27/2024, 5:50 PM [facility] [address] [place], [place] [address] Phone: [phone]. Fax:  [phone]

## 2024-04-27 NOTE — Patient Instructions (Addendum)
 Description   Hold warfarin today and then START taking warfarin 1 tablet daily except for 1/2 a tablet on Fridays. Recheck INR in 2 weeks. Eat a serving of greens today and over the weekend.  Coumadin  Clinic for any changes in medications or upcoming procedures.  Coumadin  Clinic [phone] *Amio restarted in Feb, 200mg  daily*

## 2024-04-27 NOTE — Patient Instructions (Addendum)
 Medication Instructions:  Your physician has recommended you make the following change in your medication: Increase carvedilol  to 12.5 mg by mouth twice daily    *If you need a refill on your cardiac medications before your next appointment, please call your pharmacy*  Lab Work: none If you have labs (blood work) drawn today and your tests are completely normal, you will receive your results only by: MyChart Message (if you have MyChart) OR A paper copy in the mail If you have any lab test that is abnormal or we need to change your treatment, we will call you to review the results.  Testing/Procedures: none  Follow-Up: At [facility], you and your health needs are our priority.  As part of our continuing mission to provide you with exceptional heart care, our providers are all part of one team.  This team includes your primary Cardiologist (physician) and Advanced Practice Providers or APPs (Physician Assistants and Nurse Practitioners) who all work together to provide you with the care you need, when you need it.  Your next appointment:   As needed  Provider:   Dr Berry Bristol    We recommend signing up for the patient portal called "MyChart".  Sign up information is provided on this After Visit Summary.  MyChart is used to connect with patients for Virtual Visits (Telemedicine).  Patients are able to view lab/test results, encounter notes, upcoming appointments, etc.  Non-urgent messages can be sent to your provider as well.   To learn more about what you can do with MyChart, go to [email or web address].   Other Instructions

## 2024-04-28 ENCOUNTER — Telehealth (HOSPITAL_COMMUNITY): Payer: Self-pay

## 2024-04-28 NOTE — Progress Notes (Incomplete)
 ADVANCED HF CLINIC NOTE  PCP: Margarete Sharps, MD HF Cardiology: Dr. Mitzie Anda Reason for Visit: Heart Failure Follow-up  HPI: [name] [name] is a 78 y.o. male with a history of CAD s/p CABG, mechanical AVR on coumadin , PAF, extensive PAD, DM, HTN, HLD, ITP, TIA, and recurrent GI bleed.   Long history of PAF with RVR S/P multiple DCCVs and failed AF ablation (12/21)  LHC in 10/20 showed 99% left main stenosis and patent LIMA to occluded LAD and a patent sequential vein to the PDA and PLA of an occluded dominant right. Medical therapy rec'd. Readmitted for CP s/p successful orbital atherectomy, PTCA, and DES to LM. LIMA-LAD patent. SVG-PDA patent. EF down to 30-35%, mechanical AoV ok. TEE 11/9 w/ improved EF, 40-45%.   In 3/21 s/p stenting to right SFA.    Echo 1/22 showed EF 60-65%, normal RV size and systolic function, stable mechanical aortic valve.   CT chest 1/22 diagnosed with squamous cell lung cancer.  He underwent radiation therapy.  He has had episodes concerning for vertebrobasilar TIAs.  Significant disease noted in the vertebral arteries.  He was seen by neurology who wanted him referred to interventional neuroradiology for angiography with possible PCI to the vertebrals. It does not appear that this ever occurred.   Echo in 1/24 showed EF 45-50%, basal to mid inferolateral hypokinesis, mildly decreased RV systolic function, mechanical aortic valve with mean gradient 6 mmHg.   Noted to be in Aflutter 1/25 in clinic and underwent unsuccessful TEE/ DCCV x3 on 2/11 and continued on amio. TEE showed EF 50-55%, no regional WMAs, nl RV, Trivial TR, peak RV-RA gradient 22 mmHg, Trivial mitral regurgitation, AoV ok.  Repeat DCCV on 02/21/24 also with post procedure EKG with atrial fibrillation.  S/p PTA and balloon angioplasty of L AT for gangrenous toes r/t PAD  He returns today for heart failure follow up. Overall feeling okay. NYHA II. Recently had shingles virus. Reports mild  claudication, that is improving and shortness of breath with stairs. Denies chest pain, fatigue, near-syncope, orthopnea, palpitations, dizziness, and abnormal bleeding. Able to perform ADLs. Appetite poor. Weight at home 264 lb. Compliant with all medications. Wants to get back on the golf course once toes heal.  PMH  1. CAD:  S/P CABG (3/04) with LIMA-LAD, seq SVG-PDA/PLV.  - NSTEMI 10/20 with cath showing patent LIMA-LAD and SVG-PDA/PLV but 99% LM stenosis, occluded LAD, moderate ramus and high OM1 jeopardized, also 90% mid AV groove LCx stenosis.  Occluded RCA.  Patient ultimately had orbital atherectomy and stenting of left main with a 4.0 x 15 mm Resolute Onyx DES.  2. Mechanical aortic valve: TEE 11/20 showed stable-appearing mechanical valve, mean gradient 9 mmHg.  3. PAD: Aortobifemoral bypass in 2016 with follow up iliac stenting and femoral endarterectomy. In 2017, he had subsequent ostial and mid right SFA intervention. - Angiography 10/20 with 80% in-stent restenosis in proximal right SFA.  - Peripheral arterial dopplers (2/21) with 75-99% R SFA stenosis in prior stented area.  - 3/21 Right SFA stent (Dr. Katheryne Pane).  - ABIs (1/22): Stable mild left SFA disease.  - Peripheral arterial dopplers (2/23): 50-99% proximal R SFA in-stent restenosis.  Medical management for now.  - Peripheral arterial dopplers (3/24): 50-99% proximal R SFA in-stent restenosis.  4. Type 2 diabetes.  5. HTN - Renal artery dopplers (8/21): No significant stenosis.  6. Hyperlipidemia 7. H/o ITP  8. TIAs  9. Chronic Systolic HF: Ischemic cardiomyopathy.  - Echo (10/20): EF  30-35%, mechanical AoV ok.  - TEE (11/20): w/ improved EF, 40-45%. Mechanical aortic valve with mean gradient 9 mmHg, normal RV.  - Echo (6/21): EF 55-60%, mild LVH, mechanical AoV with mean gradient 9 mmHg.  - Echo (1/22): EF 60-65%, normal RV size and systolic function, stable mechanical aortic valve.  - Echo (1/24): EF 45-50%, basal to mid  inferolateral hypokinesis, mildly decreased RV systolic function, mechanical aortic valve with mean gradient 6 mmHg. 10. Atrial fibrillation: Paroxysmal.  - Atrial fibrillation ablation 12/21.  - DCCV 1/22 11. Renal artery stenosis: Hx of left renal artery stenting in 2016 with repeat intervention on left for 75% in-stent restenosis, with progression of disease on the right renal artery showing 60% stenosis on duplex 07/12/19. - Renal artery dopplers (8/23): 1-59% bilateral RAS.  12. Carotid stenosis: 4/21 carotid dopplers with right subclavian stenosis, 40-59% RICA stenosis.  - Carotid dopplers (4/22): 1-39% BICA stenosis.  13. Squamous cell lung cancer: Treated with XRT.     ROS: All systems negative except as listed in HPI, PMH and Problem List.  Social History   Socioeconomic History   Marital status: Divorced    Spouse name: Not on file   Number of children: 3   Years of education: Not on file   Highest education level: Not on file  Occupational History   Occupation: Retired  Tobacco Use   Smoking status: Former    Current packs/day: 0.00    Average packs/day: 1.5 packs/day for 30.0 years (45.0 ttl pk-yrs)    Types: Cigarettes, Cigars    Start date: 12/16/1963    Quit date: 12/15/1993    Years since quitting: 30.4   Smokeless tobacco: Never   Tobacco comments:    occasional cigar  Vaping Use   Vaping status: Never Used  Substance and Sexual Activity   Alcohol  use: Yes    Alcohol /week: 15.0 standard drinks of alcohol     Types: 1 Cans of beer, 7 Shots of liquor, 7 Standard drinks or equivalent per week    Comment: drinks 1 martini's a night (2 shots)   Drug use: No   Sexual activity: Yes  Other Topics Concern   Not on file  Social History Narrative   Tries to remain active.  Frequent golfer but claudication limits this.   1 child has passed   Social Drivers of Corporate investment banker Strain: Not on file  Food Insecurity: Not on file  Transportation Needs: Not on  file  Physical Activity: Not on file  Stress: Not on file  Social Connections: Not on file  Intimate Partner Violence: Not on file   Family History  Problem Relation Age of Onset   Coronary artery disease Mother        bypass surgery - deceased   Heart disease Father        murmur, valve replacement - deceased   Breast cancer Sister    Diabetes Other        grandmother   Diabetes Paternal Grandmother    Diabetes Paternal Aunt    Colon cancer Neg Hx    Colon polyps Neg Hx    Esophageal cancer Neg Hx    Rectal cancer Neg Hx    Stomach cancer Neg Hx    Current Outpatient Medications  Medication Sig Dispense Refill   amiodarone  (PACERONE ) 200 MG tablet Take 200 mg by mouth in the morning.     aspirin  EC 81 MG tablet Take 1 tablet (81 mg total) by  mouth daily.     atorvastatin  (LIPITOR ) 80 MG tablet Take 80 mg by mouth in the morning.     Blood Glucose Monitoring Suppl (ONETOUCH VERIO IQ SYSTEM) w/Device KIT      carvedilol  (COREG ) 12.5 MG tablet Take 1 tablet (12.5 mg total) by mouth 2 (two) times daily. 180 tablet 0   enoxaparin  (LOVENOX ) 120 MG/0.8ML injection Inject 0.8 mLs (120 mg total) into the skin daily. Start tomorrow 03/25/24 (Patient taking differently: Inject 120 mg into the skin daily. Start tomorrow 03/25/24 as needed) 4.8 mL 0   Evolocumab 140 MG/ML SOAJ Inject 140 mg into the skin every 14 (fourteen) days.     furosemide  (LASIX ) 20 MG tablet Take 2 tablets (40 mg total) by mouth daily. May take extra 20 mg as needed for wt gain, swelling, or increase shortness of breath. 200 tablet 3   glucose blood (ONETOUCH VERIO) test strip      glyBURIDE  (DIABETA ) 2.5 MG tablet Take 5 mg by mouth 2 (two) times daily with a meal.     JARDIANCE 25 MG TABS tablet Take 25 mg by mouth in the morning.     nitroGLYCERIN  (NITROSTAT ) 0.4 MG SL tablet Place 1 tablet (0.4 mg total) under the tongue every 5 (five) minutes x 3 doses as needed for chest pain. 25 tablet 12   OneTouch Delica  Lancets 33G MISC      sacubitril -valsartan  (ENTRESTO ) 24-26 MG TAKE 1 TABLET BY MOUTH TWICE A DAY 180 tablet 3   spironolactone  (ALDACTONE ) 25 MG tablet TAKE 1 TABLET BY MOUTH EVERY DAY 90 tablet 3   warfarin (COUMADIN ) 5 MG tablet Take 1 to 1 and 1/2 tablets by mouth daily as prescribed by the coumadin  clinic.     No current facility-administered medications for this encounter.   BP 104/62   Pulse 74   Wt 75 kg (165 lb 6.4 oz)   SpO2 97%   BMI 25.15 kg/m   Wt Readings from Last 3 Encounters:  05/02/24 75 kg (165 lb 6.4 oz)  04/27/24 74.4 kg (164 lb)  03/24/24 75.8 kg (167 lb)   PHYSICAL EXAM: General: Well appearing. No distress on RA Cardiac: JVP ~7cm. S1 and S2 present. No murmurs or rub. Extremities: Warm and dry.  Trace LLE edema.  Neuro: Alert and oriented x3. Affect pleasant. Moves all extremities without difficulty.  ASSESSMENT & PLAN: 1. Permanent Atrial Flutter/ Atrial Fibrillation   - previous multiple DCCV for Afib and had Afib ablation in 2021 - in AFL last OV 1/25, failed DCCV x3 attempts on 2/11, followed by second failed DCCV x3 on 3/17. - irregular rhythm on exam - continue amiodarone  200 mg daily. TSH elevated at last visit, check TSH, T4, T3. Will plan to decrease if elevated. - on coumadin , again with INR supra-therapeutic 5.3 yesterday, addressed per Coumadin  clinic. - continue Coreg  12.5 mg bid  - referred to EP for re-AFL ablation eval, patient did not follow up on this, as he is asymptomatic and not interested in re-do ablation at this time. Remains rate controlled.   2. Chronic Heart Failure, HFrEF>>HFimpEF  - Ischemic cardiomyopathy.  Echo 10/20 showed EF 30-35% and WMA, prior echo earlier in 10/20 showed EF 50-55%. Cath 10/20 showed jeopardized ramus and LCx territory (99% distal left main, occluded LAD, patent LIMA-LAD). Concerned that ischemia in this territory triggered CHF, worsening of LV function. Now s/p DES to left main, TEE in 11/20 after  intervention showed EF higher at 40-45%.  Echo in 1/22 showed EF 60-65% with normal RV. Echo in 1/24 showed  EF 45-50%, basal to mid inferolateral hypokinesis, mildly decreased RV systolic function, mechanical aortic valve with mean gradient 6 mmHg. Recent TEE 2/25 EF 50-55%, normal RV - Volume up on exam, NYHA II. Limited mostly by PAD.   - continue Lasix  40 mg daily, with additional 20 mg PRN. Rarely needs.   - continue Entresto  24-26 mg bid - continue Jardiance 25 mg daily  - continue spiro 25 mg daily  - continue coreg  12.5 mg bid  3. CAD - Complicated disease, coronary angiography 10/20 showed patent SVG-RCA territory and patent LIMA-LAD.  The proximal LAD was occluded and there was 99% distal left main stenosis.  This left the ramus and LCx in jeopardy.  He was not a good candidate for protected PCI => cannot place Impella with mechanical aortic valve and peripheral vascular disease likely precludes a femoral IABP.  It was decided to try to manage him medically. However, he returned with progressive chest pain episodes and NSTEMI, hs-TnI up to 935. S/p successful orbital atherectomy and stenting of the left main with a 4.0x15 mm Resolute Onyx DES 10/10/19. - denies CP  - no ASA given need for chronic coumadin  and h/o GIB - on ? blocker  4. S/p Mechanical AVR  - stable on echo 1/24 and TEE 2/25 - continue wafarin  5. PAD extensive, s/p aorto-bifemoral bypass, h/o iliac stenting, h/o femoral endarterectomy, PCI to right SFA in 2017.  Angiography in 10/20 with 80% ISR in right SFA.  He had PCI to right SFA in 3/21, no claudication now.  Repeat ABIs in 1/22 with mild left SFA disease. Peripheral arterial dopplers in 2/23 showed 50-99% in-stent restenosis in the right SFA stent, repeat dopplers in 3/24 were unchanged. Now s/p PTA and balloon angioplasty of L AT with Dr. Berry Bristol 4/25.  - Does has gangrenous toes on L with mild claudication. Improving  6. Carotid/subclavian stenosis: Mild disease on  dopplers in 4/22.  - Followed at [facility].   7. H/o GIB  - denies any recent melena/hematochezia   8. Epistaxis - often supratherapeutic INR - Now off Plavix , on ASA.  - No recurrence   Follow up with 4 months in Dr. McLean  [place] Nima Bamburg, NP 05/02/2024

## 2024-04-28 NOTE — Telephone Encounter (Signed)
 Called to confirm/remind patient of their appointment at the [facility] on 05/02/24.   Appointment:   [] Confirmed  [x] Left mess   [] No answer/No voice mail  [] VM Full/unable to leave message  [] Phone not in service  And to bring in all medications and/or complete list.

## 2024-05-02 ENCOUNTER — Ambulatory Visit (HOSPITAL_COMMUNITY)
Admission: RE | Admit: 2024-05-02 | Discharge: 2024-05-02 | Disposition: A | Discharge status: Home / Self Care | Source: Ambulatory Visit | Attending: Cardiology | Admitting: Cardiology

## 2024-05-02 ENCOUNTER — Ambulatory Visit (HOSPITAL_COMMUNITY): Payer: Self-pay | Admitting: Cardiology

## 2024-05-02 ENCOUNTER — Encounter (HOSPITAL_COMMUNITY): Payer: Self-pay

## 2024-05-02 VITALS — BP 104/62 | HR 74 | Wt 165.4 lb

## 2024-05-02 DIAGNOSIS — I871 Compression of vein: Secondary | ICD-10-CM | POA: Insufficient documentation

## 2024-05-02 DIAGNOSIS — Z951 Presence of aortocoronary bypass graft: Secondary | ICD-10-CM | POA: Insufficient documentation

## 2024-05-02 DIAGNOSIS — Z955 Presence of coronary angioplasty implant and graft: Secondary | ICD-10-CM | POA: Diagnosis not present

## 2024-05-02 DIAGNOSIS — I96 Gangrene, not elsewhere classified: Secondary | ICD-10-CM | POA: Diagnosis not present

## 2024-05-02 DIAGNOSIS — I255 Ischemic cardiomyopathy: Secondary | ICD-10-CM | POA: Diagnosis not present

## 2024-05-02 DIAGNOSIS — I5032 Chronic diastolic (congestive) heart failure: Secondary | ICD-10-CM

## 2024-05-02 DIAGNOSIS — R946 Abnormal results of thyroid function studies: Secondary | ICD-10-CM | POA: Diagnosis not present

## 2024-05-02 DIAGNOSIS — R040 Epistaxis: Secondary | ICD-10-CM

## 2024-05-02 DIAGNOSIS — I2510 Atherosclerotic heart disease of native coronary artery without angina pectoris: Secondary | ICD-10-CM | POA: Diagnosis not present

## 2024-05-02 DIAGNOSIS — Z79899 Other long term (current) drug therapy: Secondary | ICD-10-CM | POA: Diagnosis not present

## 2024-05-02 DIAGNOSIS — Z952 Presence of prosthetic heart valve: Secondary | ICD-10-CM | POA: Diagnosis not present

## 2024-05-02 DIAGNOSIS — E1151 Type 2 diabetes mellitus with diabetic peripheral angiopathy without gangrene: Secondary | ICD-10-CM | POA: Diagnosis not present

## 2024-05-02 DIAGNOSIS — Z8673 Personal history of transient ischemic attack (TIA), and cerebral infarction without residual deficits: Secondary | ICD-10-CM | POA: Diagnosis not present

## 2024-05-02 DIAGNOSIS — D693 Immune thrombocytopenic purpura: Secondary | ICD-10-CM | POA: Insufficient documentation

## 2024-05-02 DIAGNOSIS — Z7984 Long term (current) use of oral hypoglycemic drugs: Secondary | ICD-10-CM | POA: Diagnosis not present

## 2024-05-02 DIAGNOSIS — Z923 Personal history of irradiation: Secondary | ICD-10-CM | POA: Insufficient documentation

## 2024-05-02 DIAGNOSIS — I252 Old myocardial infarction: Secondary | ICD-10-CM | POA: Insufficient documentation

## 2024-05-02 DIAGNOSIS — I5040 Unspecified combined systolic (congestive) and diastolic (congestive) heart failure: Secondary | ICD-10-CM | POA: Diagnosis not present

## 2024-05-02 DIAGNOSIS — I480 Paroxysmal atrial fibrillation: Secondary | ICD-10-CM | POA: Diagnosis not present

## 2024-05-02 DIAGNOSIS — Z7901 Long term (current) use of anticoagulants: Secondary | ICD-10-CM | POA: Diagnosis not present

## 2024-05-02 DIAGNOSIS — I110 Hypertensive heart disease with heart failure: Secondary | ICD-10-CM | POA: Insufficient documentation

## 2024-05-02 DIAGNOSIS — I739 Peripheral vascular disease, unspecified: Secondary | ICD-10-CM

## 2024-05-02 DIAGNOSIS — Z87891 Personal history of nicotine dependence: Secondary | ICD-10-CM | POA: Diagnosis not present

## 2024-05-02 DIAGNOSIS — I4892 Unspecified atrial flutter: Secondary | ICD-10-CM | POA: Diagnosis not present

## 2024-05-02 DIAGNOSIS — I4821 Permanent atrial fibrillation: Secondary | ICD-10-CM | POA: Diagnosis not present

## 2024-05-02 LAB — BASIC METABOLIC PANEL WITH GFR
Anion gap: 12 (ref 5–15)
BUN: 32 mg/dL — ABNORMAL HIGH (ref 8–23)
CO2: 26 mmol/L (ref 22–32)
Calcium: 9.3 mg/dL (ref 8.9–10.3)
Chloride: 98 mmol/L (ref 98–111)
Creatinine, Ser: 1.4 mg/dL — ABNORMAL HIGH (ref 0.61–1.24)
GFR, Estimated: 51 mL/min — ABNORMAL LOW (ref >60)
Glucose, Bld: 193 mg/dL — ABNORMAL HIGH (ref 70–99)
Potassium: 4.4 mmol/L (ref 3.5–5.1)
Sodium: 136 mmol/L (ref 135–145)

## 2024-05-02 LAB — T4, FREE: Free T4: 1.27 ng/dL — ABNORMAL HIGH (ref 0.61–1.12)

## 2024-05-02 LAB — TSH: TSH: 4.032 u[IU]/mL (ref 0.350–4.500)

## 2024-05-02 NOTE — Patient Instructions (Addendum)
 Thank you for coming in today  If you had labs drawn today, any labs that are abnormal the clinic will call you No news is good news  Medications: No changes  Follow up appointments:  Your physician recommends that you schedule a follow-up appointment in:  4 months With Dr. Mitzie Anda Please call our office to schedule the follow-up appointment in July for September 2025.   Do the following things EVERYDAY: Weigh yourself in the morning before breakfast. Write it down and keep it in a log. Take your medicines as prescribed Eat low salt foods--Limit salt (sodium) to 2000 mg per day.  Stay as active as you can everyday Limit all fluids for the day to less than 2 liters   At the [facility], you and your health needs are our priority. As part of our continuing mission to provide you with exceptional heart care, we have created designated Provider Care Teams. These Care Teams include your primary Cardiologist (physician) and Advanced Practice Providers (APPs- Physician Assistants and Nurse Practitioners) who all work together to provide you with the care you need, when you need it.   You may see any of the following providers on your designated Care Team at your next follow up: Dr Jules Oar Dr Peder Bourdon Dr. Mimi Alt, NP Ruddy Corral, [place] [facility] [place], [place] Dennise Fitz, NP Luster Salters, PharmD   Please be sure to bring in all your medications bottles to every appointment.    Thank you for choosing [facility]  If you have any questions or concerns before your next appointment please send us  a message through [place] or call our office at [phone].    TO LEAVE A MESSAGE FOR THE NURSE SELECT OPTION 2, PLEASE LEAVE A MESSAGE INCLUDING: YOUR NAME DATE OF BIRTH CALL BACK NUMBER REASON FOR CALL**this is important as we prioritize the call backs  YOU WILL RECEIVE A CALL BACK  THE SAME DAY AS LONG AS YOU CALL BEFORE 4:00 PM .

## 2024-05-03 LAB — T3, FREE: T3, Free: 2.3 pg/mL (ref 2.0–4.4)

## 2024-05-11 ENCOUNTER — Ambulatory Visit: Attending: Cardiovascular Disease | Admitting: *Deleted

## 2024-05-11 DIAGNOSIS — G459 Transient cerebral ischemic attack, unspecified: Secondary | ICD-10-CM

## 2024-05-11 DIAGNOSIS — Z5181 Encounter for therapeutic drug level monitoring: Secondary | ICD-10-CM

## 2024-05-11 DIAGNOSIS — I359 Nonrheumatic aortic valve disorder, unspecified: Secondary | ICD-10-CM | POA: Diagnosis not present

## 2024-05-11 LAB — POCT INR
POC INR: 5.3
POC INR: 5.3

## 2024-05-11 NOTE — Patient Instructions (Addendum)
 Description   Hold warfarin 6/6 and 6/7 Then START taking warfarin 1 tablet daily except for 1/2 a tablet on Mondays and Fridays. Recheck INR in 2 weeks.  Coumadin  Clinic for any changes in medications or upcoming procedures.  Coumadin  Clinic [phone] *Per pt : Amio reduced to 100 mg daily May 2025 by HF clinic*

## 2024-05-22 ENCOUNTER — Ambulatory Visit: Attending: Cardiovascular Disease

## 2024-05-22 DIAGNOSIS — I359 Nonrheumatic aortic valve disorder, unspecified: Secondary | ICD-10-CM | POA: Diagnosis not present

## 2024-05-22 DIAGNOSIS — Z5181 Encounter for therapeutic drug level monitoring: Secondary | ICD-10-CM | POA: Diagnosis not present

## 2024-05-22 DIAGNOSIS — G459 Transient cerebral ischemic attack, unspecified: Secondary | ICD-10-CM

## 2024-05-22 LAB — POCT INR: INR: 1.6 — AB (ref 2.0–3.0)

## 2024-05-22 NOTE — Patient Instructions (Signed)
 Description   Take 1.5 tablets today and 1.5 tablets tomorrow and then resume taking warfarin 1 tablet daily except for 1/2 a tablet on Mondays and Fridays.  Recheck INR in 2 weeks.  Coumadin  Clinic for any changes in medications or upcoming procedures.  Coumadin  Clinic [phone] *Per pt : Amio reduced to 100 mg daily May 2025 by HF clinic*

## 2024-06-05 ENCOUNTER — Ambulatory Visit: Attending: Cardiovascular Disease

## 2024-06-05 DIAGNOSIS — G459 Transient cerebral ischemic attack, unspecified: Secondary | ICD-10-CM

## 2024-06-05 DIAGNOSIS — I359 Nonrheumatic aortic valve disorder, unspecified: Secondary | ICD-10-CM

## 2024-06-05 DIAGNOSIS — Z5181 Encounter for therapeutic drug level monitoring: Secondary | ICD-10-CM | POA: Diagnosis not present

## 2024-06-05 LAB — POCT INR: INR: 2.6 (ref 2.0–3.0)

## 2024-06-05 NOTE — Progress Notes (Signed)
Please see anticoagulation encounter.

## 2024-06-05 NOTE — Patient Instructions (Signed)
 Description   Take 1 tablet today and then continue taking warfarin 1 tablet daily except for 1/2 a tablet on Mondays and Fridays.  Recheck INR in 3 weeks.  Coumadin  Clinic for any changes in medications or upcoming procedures.  Coumadin  Clinic [phone] *Per pt : Amio reduced to 100 mg daily May 2025 by HF clinic*

## 2024-06-16 ENCOUNTER — Other Ambulatory Visit: Payer: Self-pay | Admitting: Cardiology

## 2024-06-16 DIAGNOSIS — I359 Nonrheumatic aortic valve disorder, unspecified: Secondary | ICD-10-CM

## 2024-06-16 NOTE — Telephone Encounter (Signed)
 Prescription refill request received for warfarin Lov: 04/27/24 [place])  Next INR check: 06/27/24 Warfarin tablet strength: 5mg   Appropriate dose. Refill sent.

## 2024-06-27 ENCOUNTER — Ambulatory Visit: Attending: Cardiovascular Disease

## 2024-06-27 DIAGNOSIS — G459 Transient cerebral ischemic attack, unspecified: Secondary | ICD-10-CM

## 2024-06-27 DIAGNOSIS — Z5181 Encounter for therapeutic drug level monitoring: Secondary | ICD-10-CM | POA: Diagnosis not present

## 2024-06-27 DIAGNOSIS — I359 Nonrheumatic aortic valve disorder, unspecified: Secondary | ICD-10-CM

## 2024-06-27 LAB — POCT INR: INR: 3.8 — AB (ref 2.0–3.0)

## 2024-06-27 NOTE — Progress Notes (Signed)
 Inr 3.8  Please see anticoagulation encounter

## 2024-06-27 NOTE — Patient Instructions (Signed)
 Take 0.5 tablet today and then continue taking warfarin 1 tablet daily except for 1/2 a tablet on Mondays and Fridays.  Recheck INR in 4 weeks.  Coumadin  Clinic for any changes in medications or upcoming procedures.  Coumadin  Clinic [phone] *Per pt : Amio reduced to 100 mg daily May 2025 by HF clinic*

## 2024-07-04 NOTE — Progress Notes (Signed)
 This encounter was created in error - please disregard.

## 2024-07-11 DIAGNOSIS — I6990 Unspecified sequelae of unspecified cerebrovascular disease: Secondary | ICD-10-CM | POA: Diagnosis not present

## 2024-07-11 DIAGNOSIS — I4819 Other persistent atrial fibrillation: Secondary | ICD-10-CM | POA: Diagnosis not present

## 2024-07-11 DIAGNOSIS — N1832 Chronic kidney disease, stage 3b: Secondary | ICD-10-CM | POA: Diagnosis not present

## 2024-07-11 DIAGNOSIS — I130 Hypertensive heart and chronic kidney disease with heart failure and stage 1 through stage 4 chronic kidney disease, or unspecified chronic kidney disease: Secondary | ICD-10-CM | POA: Diagnosis not present

## 2024-07-11 DIAGNOSIS — C3492 Malignant neoplasm of unspecified part of left bronchus or lung: Secondary | ICD-10-CM | POA: Diagnosis not present

## 2024-07-11 DIAGNOSIS — I739 Peripheral vascular disease, unspecified: Secondary | ICD-10-CM | POA: Diagnosis not present

## 2024-07-11 DIAGNOSIS — E1122 Type 2 diabetes mellitus with diabetic chronic kidney disease: Secondary | ICD-10-CM | POA: Diagnosis not present

## 2024-07-11 DIAGNOSIS — J449 Chronic obstructive pulmonary disease, unspecified: Secondary | ICD-10-CM | POA: Diagnosis not present

## 2024-07-11 DIAGNOSIS — D6869 Other thrombophilia: Secondary | ICD-10-CM | POA: Diagnosis not present

## 2024-07-11 DIAGNOSIS — C4492 Squamous cell carcinoma of skin, unspecified: Secondary | ICD-10-CM | POA: Diagnosis not present

## 2024-07-11 DIAGNOSIS — Z7901 Long term (current) use of anticoagulants: Secondary | ICD-10-CM | POA: Diagnosis not present

## 2024-07-11 DIAGNOSIS — I5032 Chronic diastolic (congestive) heart failure: Secondary | ICD-10-CM | POA: Diagnosis not present

## 2024-07-12 ENCOUNTER — Ambulatory Visit (INDEPENDENT_AMBULATORY_CARE_PROVIDER_SITE_OTHER): Admitting: Podiatry

## 2024-07-12 ENCOUNTER — Encounter: Payer: Self-pay | Admitting: Podiatry

## 2024-07-12 DIAGNOSIS — D692 Other nonthrombocytopenic purpura: Secondary | ICD-10-CM | POA: Diagnosis not present

## 2024-07-12 DIAGNOSIS — L578 Other skin changes due to chronic exposure to nonionizing radiation: Secondary | ICD-10-CM | POA: Diagnosis not present

## 2024-07-12 DIAGNOSIS — D689 Coagulation defect, unspecified: Secondary | ICD-10-CM

## 2024-07-12 DIAGNOSIS — B029 Zoster without complications: Secondary | ICD-10-CM | POA: Diagnosis not present

## 2024-07-12 DIAGNOSIS — Z85828 Personal history of other malignant neoplasm of skin: Secondary | ICD-10-CM | POA: Diagnosis not present

## 2024-07-12 DIAGNOSIS — I739 Peripheral vascular disease, unspecified: Secondary | ICD-10-CM

## 2024-07-12 DIAGNOSIS — B351 Tinea unguium: Secondary | ICD-10-CM

## 2024-07-12 DIAGNOSIS — L570 Actinic keratosis: Secondary | ICD-10-CM | POA: Diagnosis not present

## 2024-07-12 NOTE — Progress Notes (Signed)
 This patient returns to my office for at risk foot care.  This patient requires this care by a professional since this patient will be at risk due to having diabetes , chronic anticoagulation renal disease and PVD.  Patient is taking coumadin .  This patient is unable to cut nails himself since the patient cannot reach his nails.These nails are painful walking and wearing shoes.  .  This patient presents for at risk foot care today.  General Appearance  Alert, conversant and in no acute stress.  Vascular  Dorsalis pedis and posterior tibial  pulses are  weakly palpable left foot.    Capillary return is within normal limits  bilaterally. Temperature is within normal limits  bilaterally.  Neurologic  Senn-Weinstein monofilament wire test within normal limits  bilaterally. Muscle power within normal limits bilaterally.  Nails Thick disfigured discolored nails with subungual debris  from hallux to fifth toes bilaterally. No evidence of bacterial infection or drainage bilaterally.  Orthopedic  No limitations of motion  feet .  No crepitus or effusions noted.  No bony pathology or digital deformities noted.  Skin  normotropic skin with no porokeratosis noted bilaterally.  No signs of infections or ulcers noted.     Onychomycosis  Pain in right toes  Pain in left toes  Consent was obtained for treatment procedures.   Mechanical debridement of nails 1-5  bilaterally performed with a nail nipper.  Filed with dremel without incident.    Return office visit    4 months                  Told patient to return for periodic foot care and evaluation due to potential at risk complications.   Cordella Bold DPM

## 2024-07-14 ENCOUNTER — Telehealth: Payer: Self-pay | Admitting: *Deleted

## 2024-07-14 NOTE — Telephone Encounter (Signed)
 Called patient to inform of CT for 07-26-24- arrival time- 12:15 pm @ [facility], no restrictions to scan, patient to receive results from Dr. Shannon on 07/31/24 @ 10:30 am, lvm for a return call

## 2024-07-17 ENCOUNTER — Telehealth: Payer: Self-pay | Admitting: *Deleted

## 2024-07-17 NOTE — Telephone Encounter (Signed)
 CALLED PATIENT TO INFORM THAT CT HAS BEEN MOVED TO [facility] ON 07-26-24- ARRIVAL TIME- 4:45 PM , NO RESTRICTIONS TO SCAN, PATIENT TO RECEIVE RESULTS FROM DR. KINARD ON 07/31/24 @ 10:30 AM, SPOKE WITH PATIENT AND HE IS AWARE OF THESE APPTS. AND THE INSTRUCTIONS

## 2024-07-17 NOTE — Telephone Encounter (Signed)
 RETURNED PATIENT'S PHONE CALL, SPOKE WITH PATIENT. ?

## 2024-07-25 ENCOUNTER — Ambulatory Visit: Attending: Cardiology | Admitting: *Deleted

## 2024-07-25 DIAGNOSIS — I359 Nonrheumatic aortic valve disorder, unspecified: Secondary | ICD-10-CM | POA: Diagnosis not present

## 2024-07-25 DIAGNOSIS — G459 Transient cerebral ischemic attack, unspecified: Secondary | ICD-10-CM | POA: Diagnosis not present

## 2024-07-25 DIAGNOSIS — Z5181 Encounter for therapeutic drug level monitoring: Secondary | ICD-10-CM | POA: Diagnosis not present

## 2024-07-25 LAB — POCT INR: INR: 2.1 (ref 2.0–3.0)

## 2024-07-25 NOTE — Patient Instructions (Addendum)
 Description   Today take 2 tablets then continue taking warfarin 1 tablet daily except for 1/2 a tablet on Mondays and Fridays.  Recheck INR in 4 weeks.  Coumadin  Clinic for any changes in medications or upcoming procedures.  Coumadin  Clinic [phone] *Per pt : Amio reduced to 100 mg daily May 2025 by HF clinic*

## 2024-07-25 NOTE — Progress Notes (Signed)
 INR-2.1; Please see anticoagulation encounter  Today take 2 tablets then continue taking warfarin 1 tablet daily except for 1/2 a tablet on Mondays and Fridays.  Recheck INR in 4 weeks.  Coumadin  Clinic for any changes in medications or upcoming procedures.  Coumadin  Clinic [phone] *Per pt : Amio reduced to 100 mg daily May 2025 by HF clinic*

## 2024-07-26 ENCOUNTER — Ambulatory Visit (HOSPITAL_BASED_OUTPATIENT_CLINIC_OR_DEPARTMENT_OTHER)
Admission: RE | Admit: 2024-07-26 | Discharge: 2024-07-26 | Disposition: A | Discharge status: Home / Self Care | Source: Ambulatory Visit | Attending: Radiology | Admitting: Radiology

## 2024-07-26 ENCOUNTER — Ambulatory Visit (HOSPITAL_COMMUNITY)

## 2024-07-26 DIAGNOSIS — C3412 Malignant neoplasm of upper lobe, left bronchus or lung: Secondary | ICD-10-CM | POA: Diagnosis not present

## 2024-07-26 DIAGNOSIS — I700 Atherosclerosis of aorta: Secondary | ICD-10-CM | POA: Diagnosis not present

## 2024-07-26 DIAGNOSIS — J849 Interstitial pulmonary disease, unspecified: Secondary | ICD-10-CM | POA: Diagnosis not present

## 2024-07-26 DIAGNOSIS — C3490 Malignant neoplasm of unspecified part of unspecified bronchus or lung: Secondary | ICD-10-CM | POA: Diagnosis not present

## 2024-07-30 NOTE — Progress Notes (Incomplete)
 Radiation Oncology         [phone] ________________________________  Name: [name] [name] MRN: [id number]  Date: 07/31/2024  DOB: 03/09/1946  Follow-Up Visit Note  CC: Onita Rush, MD  Brenna Adine CROME, DO  No diagnosis found.  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 He was last seen here for follow-up on 01/27/24.    In the interval since his last visit, he presented to the ED on 04/26/24 with continuous right sided epistaxis which began that morning (exacerbated due to blood thinners). His bleeding resolved without intervention in the ED, although he was incidentally found to be mildly hypoxic at 89%. He denied any SOB and was able to ambulate without desaturation.   His most recent chest CT on 07/26/24 demonstrates a stable post treatment appearance of the posterior lingula with a dense consolidation about marking clips present. Overall, imaging shows no evidence of recurrent or metastatic disease in the chest. Other notable findings include: extensive interlobular septal thickening and ground-glass opacity throughout the bilateral lung bases, which appears to have substantially increased in the right lung base, and appears similar in the left lung base. Differential considerations for this finding include edema superimposed on emphysema vs nonspecific infection/inflammation, potentially including fibrotic interstitial lung disease given the apparent progression present in the right lung base.   No other significant interval history since the  patient was last seen.   Of note: He underwent cardioversion on 02/21/24 in management of a-fib. He also developed critical limb ischemia of the left foot and underwent a LE angiography in this setting on 03/24/24. He continues to be followed closely by his cardiologist, Dr. Ladona, for management of his cardiovascular disorders.      ***                   Allergies:  has no known allergies.  Meds: Current Outpatient Medications  Medication Sig Dispense Refill   amiodarone  (PACERONE ) 200 MG tablet Take 200 mg by mouth in the morning.     aspirin  EC 81 MG tablet Take 1 tablet (81 mg total) by mouth daily.     atorvastatin  (LIPITOR ) 80 MG tablet Take 80 mg by mouth in the morning.     Blood Glucose Monitoring Suppl (ONETOUCH VERIO IQ SYSTEM) w/Device KIT      carvedilol  (COREG ) 12.5 MG tablet Take 1 tablet (12.5 mg total) by mouth 2 (two) times daily. 180 tablet 0   enoxaparin  (LOVENOX ) 120 MG/0.8ML injection Inject 0.8 mLs (120 mg total) into the skin daily. Start tomorrow 03/25/24 (Patient taking differently: Inject 120 mg into the skin daily. Start tomorrow 03/25/24 as needed) 4.8 mL 0   Evolocumab 140 MG/ML SOAJ Inject 140 mg into the skin every 14 (fourteen) days.     furosemide  (LASIX ) 20 MG tablet Take 2 tablets (40 mg total) by mouth daily. May take extra 20 mg as needed for wt gain, swelling, or increase shortness of breath. 200 tablet 3   glucose blood (ONETOUCH VERIO) test strip      glyBURIDE  (DIABETA ) 2.5 MG tablet Take  5 mg by mouth 2 (two) times daily with a meal.     JARDIANCE 25 MG TABS tablet Take 25 mg by mouth in the morning.     nitroGLYCERIN  (NITROSTAT ) 0.4 MG SL tablet Place 1 tablet (0.4 mg total) under the tongue every 5 (five) minutes x 3 doses as needed for chest pain. 25 tablet 12   OneTouch Delica Lancets 33G MISC      sacubitril -valsartan  (ENTRESTO ) 24-26 MG TAKE 1 TABLET BY MOUTH TWICE A DAY 180 tablet 3   spironolactone  (ALDACTONE ) 25 MG tablet TAKE 1 TABLET BY  MOUTH EVERY DAY 90 tablet 3   warfarin (COUMADIN ) 5 MG tablet Take 1/2 tablet to 1 tablet by mouth daily as prescribed by the coumadin  clinic. 95 tablet 0   No current facility-administered medications for this visit.    Physical Findings: The patient is in no acute distress. Patient is alert and oriented.  vitals were not taken for this visit. .  No significant changes. Lungs are clear to auscultation bilaterally. Heart has regular rate and rhythm. No palpable cervical, supraclavicular, or axillary adenopathy. Abdomen soft, non-tender, normal bowel sounds.   Lab Findings: Lab Results  Component Value Date   WBC 8.0 03/21/2024   HGB 16.4 03/21/2024   HCT 50.4 03/21/2024   MCV 96 03/21/2024   PLT 216 03/21/2024    Radiographic Findings: CT CHEST WO CONTRAST Result Date: 07/28/2024 CLINICAL DATA:  Non-small cell lung cancer restaging * Tracking Code: BO * EXAM: CT CHEST WITHOUT CONTRAST TECHNIQUE: Multidetector CT imaging of the chest was performed following the standard protocol without IV contrast. RADIATION DOSE REDUCTION: This exam was performed according to the departmental dose-optimization program which includes automated exposure control, adjustment of the mA and/or kV according to patient size and/or use of iterative reconstruction technique. COMPARISON:  01/20/2024 FINDINGS: Cardiovascular: Severe pipe like aortic atherosclerosis. Aortic valve prosthesis. Cardiomegaly. Extensive three-vessel coronary artery calcifications status post median sternotomy. No pericardial effusion. Mediastinum/Nodes: No enlarged mediastinal, hilar, or axillary lymph nodes. Thyroid  gland, trachea, and esophagus demonstrate no significant findings. Lungs/Pleura: Moderate centrilobular and paraseptal emphysema. Diffuse bilateral bronchial wall thickening. Extensive interlobular septal thickening and ground-glass opacity throughout the bilateral lung bases, substantially increased in the right lung base,  similar in the left lung base (series 3, image 130). Unchanged post treatment appearance of the posterior lingula with dense consolidation about marking clips (series 3, image 75). Unchanged subpleural nodule of the anterior right middle lobe measuring 0.4 cm (series 3, image 114). No pleural effusion or pneumothorax. Upper Abdomen: No acute abnormality.  Colonic diverticulosis. Musculoskeletal: Lipoma of the anterior right chest (series 2, image 38). No acute osseous findings. IMPRESSION: 1. Unchanged post treatment appearance of the posterior lingula with dense consolidation about marking clips. No evidence of recurrent or metastatic disease in the chest. 2. Extensive interlobular septal thickening and ground-glass opacity throughout the bilateral lung bases, substantially increased in the right lung base, similar in the left lung base. This may reflect edema superimposed on emphysema or other nonspecific infection/inflammation, potentially including fibrotic interstitial lung disease given apparent progression. 3. Emphysema and diffuse bilateral bronchial wall thickening. 4. Cardiomegaly and coronary artery disease. Aortic Atherosclerosis (ICD10-I70.0) and Emphysema (ICD10-J43.9). Electronically Signed   By: Marolyn JONETTA Jaksch M.D.   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  ***  Plan:  ***   ***  minutes of total time was spent for this patient encounter, including preparation, face-to-face counseling with the patient and coordination of care, physical exam, and documentation of the encounter. ____________________________________  Lynwood CHARM Nasuti, PhD, MD  This document serves as a record of services personally performed by Lynwood Nasuti, MD. It was created on his behalf by Dorthy Fuse, a trained medical scribe. The creation of this record is based on the  scribe's personal observations and the provider's statements to them. This document has been checked and approved by the attending provider.

## 2024-07-30 NOTE — Progress Notes (Signed)
 Radiation Oncology         [phone] ________________________________  Name: [name] [name] MRN: [id number]  Date: 07/31/2024  DOB: 03/09/1946  Follow-Up Visit Note  CC: Onita Rush, MD  Brenna Adine CROME, DO  No diagnosis found.  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 He was last seen here for follow-up on 01/27/24.    In the interval since his last visit, he presented to the ED on 04/26/24 with continuous right sided epistaxis which began that morning (exacerbated due to blood thinners). His bleeding resolved without intervention in the ED, although he was incidentally found to be mildly hypoxic at 89%. He denied any SOB and was able to ambulate without desaturation.   His most recent chest CT on 07/26/24 demonstrates a stable post treatment appearance of the posterior lingula with a dense consolidation about marking clips present. Overall, imaging shows no evidence of recurrent or metastatic disease in the chest. Other notable findings include: extensive interlobular septal thickening and ground-glass opacity throughout the bilateral lung bases, which appears to have substantially increased in the right lung base, and appears similar in the left lung base. Differential considerations for this finding include edema superimposed on emphysema vs nonspecific infection/inflammation, potentially including fibrotic interstitial lung disease given the apparent progression present in the right lung base.   No other significant interval history since the  patient was last seen.   Of note: He underwent cardioversion on 02/21/24 in management of a-fib. He also developed critical limb ischemia of the left foot and underwent a LE angiography in this setting on 03/24/24. He continues to be followed closely by his cardiologist, Dr. Ladona, for management of his cardiovascular disorders.      ***                   Allergies:  has no known allergies.  Meds: Current Outpatient Medications  Medication Sig Dispense Refill   amiodarone  (PACERONE ) 200 MG tablet Take 200 mg by mouth in the morning.     aspirin  EC 81 MG tablet Take 1 tablet (81 mg total) by mouth daily.     atorvastatin  (LIPITOR ) 80 MG tablet Take 80 mg by mouth in the morning.     Blood Glucose Monitoring Suppl (ONETOUCH VERIO IQ SYSTEM) w/Device KIT      carvedilol  (COREG ) 12.5 MG tablet Take 1 tablet (12.5 mg total) by mouth 2 (two) times daily. 180 tablet 0   enoxaparin  (LOVENOX ) 120 MG/0.8ML injection Inject 0.8 mLs (120 mg total) into the skin daily. Start tomorrow 03/25/24 (Patient taking differently: Inject 120 mg into the skin daily. Start tomorrow 03/25/24 as needed) 4.8 mL 0   Evolocumab 140 MG/ML SOAJ Inject 140 mg into the skin every 14 (fourteen) days.     furosemide  (LASIX ) 20 MG tablet Take 2 tablets (40 mg total) by mouth daily. May take extra 20 mg as needed for wt gain, swelling, or increase shortness of breath. 200 tablet 3   glucose blood (ONETOUCH VERIO) test strip      glyBURIDE  (DIABETA ) 2.5 MG tablet Take  5 mg by mouth 2 (two) times daily with a meal.     JARDIANCE 25 MG TABS tablet Take 25 mg by mouth in the morning.     nitroGLYCERIN  (NITROSTAT ) 0.4 MG SL tablet Place 1 tablet (0.4 mg total) under the tongue every 5 (five) minutes x 3 doses as needed for chest pain. 25 tablet 12   OneTouch Delica Lancets 33G MISC      sacubitril -valsartan  (ENTRESTO ) 24-26 MG TAKE 1 TABLET BY MOUTH TWICE A DAY 180 tablet 3   spironolactone  (ALDACTONE ) 25 MG tablet TAKE 1 TABLET BY  MOUTH EVERY DAY 90 tablet 3   warfarin (COUMADIN ) 5 MG tablet Take 1/2 tablet to 1 tablet by mouth daily as prescribed by the coumadin  clinic. 95 tablet 0   No current facility-administered medications for this visit.    Physical Findings: The patient is in no acute distress. Patient is alert and oriented.  vitals were not taken for this visit. .  No significant changes. Lungs are clear to auscultation bilaterally. Heart has regular rate and rhythm. No palpable cervical, supraclavicular, or axillary adenopathy. Abdomen soft, non-tender, normal bowel sounds.   Lab Findings: Lab Results  Component Value Date   WBC 8.0 03/21/2024   HGB 16.4 03/21/2024   HCT 50.4 03/21/2024   MCV 96 03/21/2024   PLT 216 03/21/2024    Radiographic Findings: CT CHEST WO CONTRAST Result Date: 07/28/2024 CLINICAL DATA:  Non-small cell lung cancer restaging * Tracking Code: BO * EXAM: CT CHEST WITHOUT CONTRAST TECHNIQUE: Multidetector CT imaging of the chest was performed following the standard protocol without IV contrast. RADIATION DOSE REDUCTION: This exam was performed according to the departmental dose-optimization program which includes automated exposure control, adjustment of the mA and/or kV according to patient size and/or use of iterative reconstruction technique. COMPARISON:  01/20/2024 FINDINGS: Cardiovascular: Severe pipe like aortic atherosclerosis. Aortic valve prosthesis. Cardiomegaly. Extensive three-vessel coronary artery calcifications status post median sternotomy. No pericardial effusion. Mediastinum/Nodes: No enlarged mediastinal, hilar, or axillary lymph nodes. Thyroid  gland, trachea, and esophagus demonstrate no significant findings. Lungs/Pleura: Moderate centrilobular and paraseptal emphysema. Diffuse bilateral bronchial wall thickening. Extensive interlobular septal thickening and ground-glass opacity throughout the bilateral lung bases, substantially increased in the right lung base,  similar in the left lung base (series 3, image 130). Unchanged post treatment appearance of the posterior lingula with dense consolidation about marking clips (series 3, image 75). Unchanged subpleural nodule of the anterior right middle lobe measuring 0.4 cm (series 3, image 114). No pleural effusion or pneumothorax. Upper Abdomen: No acute abnormality.  Colonic diverticulosis. Musculoskeletal: Lipoma of the anterior right chest (series 2, image 38). No acute osseous findings. IMPRESSION: 1. Unchanged post treatment appearance of the posterior lingula with dense consolidation about marking clips. No evidence of recurrent or metastatic disease in the chest. 2. Extensive interlobular septal thickening and ground-glass opacity throughout the bilateral lung bases, substantially increased in the right lung base, similar in the left lung base. This may reflect edema superimposed on emphysema or other nonspecific infection/inflammation, potentially including fibrotic interstitial lung disease given apparent progression. 3. Emphysema and diffuse bilateral bronchial wall thickening. 4. Cardiomegaly and coronary artery disease. Aortic Atherosclerosis (ICD10-I70.0) and Emphysema (ICD10-J43.9). Electronically Signed   By: Marolyn JONETTA Jaksch M.D.   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  ***  Plan:  ***   ***  minutes of total time was spent for this patient encounter, including preparation, face-to-face counseling with the patient and coordination of care, physical exam, and documentation of the encounter. ____________________________________  Lynwood CHARM Nasuti, PhD, MD  This document serves as a record of services personally performed by Lynwood Nasuti, MD. It was created on his behalf by Dorthy Fuse, a trained medical scribe. The creation of this record is based on the  scribe's personal observations and the provider's statements to them. This document has been checked and approved by the attending provider.

## 2024-07-31 ENCOUNTER — Ambulatory Visit
Admission: RE | Admit: 2024-07-31 | Discharge: 2024-07-31 | Disposition: A | Discharge status: Home / Self Care | Payer: Self-pay | Source: Ambulatory Visit | Attending: Radiation Oncology | Admitting: Radiation Oncology

## 2024-07-31 ENCOUNTER — Encounter: Payer: Self-pay | Admitting: Radiation Oncology

## 2024-07-31 DIAGNOSIS — K5730 Diverticulosis of large intestine without perforation or abscess without bleeding: Secondary | ICD-10-CM | POA: Diagnosis not present

## 2024-07-31 DIAGNOSIS — I517 Cardiomegaly: Secondary | ICD-10-CM | POA: Diagnosis not present

## 2024-07-31 DIAGNOSIS — C3412 Malignant neoplasm of upper lobe, left bronchus or lung: Secondary | ICD-10-CM | POA: Insufficient documentation

## 2024-07-31 DIAGNOSIS — Z79899 Other long term (current) drug therapy: Secondary | ICD-10-CM | POA: Insufficient documentation

## 2024-07-31 DIAGNOSIS — Z7982 Long term (current) use of aspirin: Secondary | ICD-10-CM | POA: Insufficient documentation

## 2024-07-31 DIAGNOSIS — C3492 Malignant neoplasm of unspecified part of left bronchus or lung: Secondary | ICD-10-CM

## 2024-07-31 DIAGNOSIS — J432 Centrilobular emphysema: Secondary | ICD-10-CM | POA: Diagnosis not present

## 2024-07-31 DIAGNOSIS — Z923 Personal history of irradiation: Secondary | ICD-10-CM | POA: Insufficient documentation

## 2024-07-31 DIAGNOSIS — I2510 Atherosclerotic heart disease of native coronary artery without angina pectoris: Secondary | ICD-10-CM | POA: Insufficient documentation

## 2024-07-31 DIAGNOSIS — Z7984 Long term (current) use of oral hypoglycemic drugs: Secondary | ICD-10-CM | POA: Insufficient documentation

## 2024-07-31 NOTE — Progress Notes (Signed)
 [name] [name] is here today for follow up post radiation to the lung.  Lung Side: Left. Patient completed treatment on 02/25/21.   Does the patient complain of any of the following: Pain: No Shortness of breath w/wo exertion: Yes mostly on exertion.  Cough: yes, productive.  Hemoptysis: Yes, with each cough.  Pain with swallowing: No Swallowing/choking concerns: No Appetite: Fair  Energy Level: Low Post radiation skin Changes: No     Additional comments if applicable:   BP (!) (P) 137/55 (BP Location: Left Arm, Patient Position: Sitting)   Pulse (P) 76   Temp (!) (P) 97.3 F (36.3 C) (Temporal)   Resp (P) 18   Ht (P) 5' 8 (1.727 m)   Wt (P) 162 lb 6 oz (73.7 kg)   BMI (P) 24.69 kg/m

## 2024-08-08 ENCOUNTER — Other Ambulatory Visit: Payer: Self-pay

## 2024-08-08 ENCOUNTER — Inpatient Hospital Stay (HOSPITAL_BASED_OUTPATIENT_CLINIC_OR_DEPARTMENT_OTHER)
Admission: EM | Admit: 2024-08-08 | Discharge: 2024-08-11 | DRG: 377 | Disposition: A | Discharge status: Home / Self Care | Attending: Internal Medicine | Admitting: Internal Medicine

## 2024-08-08 ENCOUNTER — Emergency Department (HOSPITAL_BASED_OUTPATIENT_CLINIC_OR_DEPARTMENT_OTHER)

## 2024-08-08 ENCOUNTER — Encounter (HOSPITAL_BASED_OUTPATIENT_CLINIC_OR_DEPARTMENT_OTHER): Payer: Self-pay

## 2024-08-08 DIAGNOSIS — K625 Hemorrhage of anus and rectum: Secondary | ICD-10-CM | POA: Diagnosis not present

## 2024-08-08 DIAGNOSIS — J449 Chronic obstructive pulmonary disease, unspecified: Secondary | ICD-10-CM | POA: Diagnosis present

## 2024-08-08 DIAGNOSIS — J8410 Pulmonary fibrosis, unspecified: Secondary | ICD-10-CM | POA: Diagnosis present

## 2024-08-08 DIAGNOSIS — Z7982 Long term (current) use of aspirin: Secondary | ICD-10-CM

## 2024-08-08 DIAGNOSIS — E785 Hyperlipidemia, unspecified: Secondary | ICD-10-CM | POA: Diagnosis present

## 2024-08-08 DIAGNOSIS — I480 Paroxysmal atrial fibrillation: Secondary | ICD-10-CM | POA: Diagnosis present

## 2024-08-08 DIAGNOSIS — I10 Essential (primary) hypertension: Secondary | ICD-10-CM | POA: Diagnosis present

## 2024-08-08 DIAGNOSIS — I2510 Atherosclerotic heart disease of native coronary artery without angina pectoris: Secondary | ICD-10-CM | POA: Diagnosis present

## 2024-08-08 DIAGNOSIS — Z833 Family history of diabetes mellitus: Secondary | ICD-10-CM

## 2024-08-08 DIAGNOSIS — I5030 Unspecified diastolic (congestive) heart failure: Secondary | ICD-10-CM | POA: Insufficient documentation

## 2024-08-08 DIAGNOSIS — Z7901 Long term (current) use of anticoagulants: Secondary | ICD-10-CM

## 2024-08-08 DIAGNOSIS — Z860101 Personal history of adenomatous and serrated colon polyps: Secondary | ICD-10-CM

## 2024-08-08 DIAGNOSIS — Z7984 Long term (current) use of oral hypoglycemic drugs: Secondary | ICD-10-CM

## 2024-08-08 DIAGNOSIS — I5032 Chronic diastolic (congestive) heart failure: Secondary | ICD-10-CM | POA: Diagnosis not present

## 2024-08-08 DIAGNOSIS — C3492 Malignant neoplasm of unspecified part of left bronchus or lung: Secondary | ICD-10-CM | POA: Diagnosis present

## 2024-08-08 DIAGNOSIS — C3490 Malignant neoplasm of unspecified part of unspecified bronchus or lung: Secondary | ICD-10-CM | POA: Diagnosis not present

## 2024-08-08 DIAGNOSIS — I130 Hypertensive heart and chronic kidney disease with heart failure and stage 1 through stage 4 chronic kidney disease, or unspecified chronic kidney disease: Secondary | ICD-10-CM | POA: Diagnosis present

## 2024-08-08 DIAGNOSIS — N1831 Chronic kidney disease, stage 3a: Secondary | ICD-10-CM | POA: Diagnosis present

## 2024-08-08 DIAGNOSIS — I739 Peripheral vascular disease, unspecified: Secondary | ICD-10-CM | POA: Diagnosis present

## 2024-08-08 DIAGNOSIS — K55069 Acute infarction of intestine, part and extent unspecified: Secondary | ICD-10-CM | POA: Diagnosis present

## 2024-08-08 DIAGNOSIS — E1151 Type 2 diabetes mellitus with diabetic peripheral angiopathy without gangrene: Secondary | ICD-10-CM | POA: Diagnosis not present

## 2024-08-08 DIAGNOSIS — R634 Abnormal weight loss: Secondary | ICD-10-CM | POA: Diagnosis present

## 2024-08-08 DIAGNOSIS — Z95820 Peripheral vascular angioplasty status with implants and grafts: Secondary | ICD-10-CM

## 2024-08-08 DIAGNOSIS — Z79899 Other long term (current) drug therapy: Secondary | ICD-10-CM

## 2024-08-08 DIAGNOSIS — Z952 Presence of prosthetic heart valve: Secondary | ICD-10-CM

## 2024-08-08 DIAGNOSIS — E1122 Type 2 diabetes mellitus with diabetic chronic kidney disease: Secondary | ICD-10-CM | POA: Diagnosis present

## 2024-08-08 DIAGNOSIS — K922 Gastrointestinal hemorrhage, unspecified: Principal | ICD-10-CM | POA: Diagnosis present

## 2024-08-08 DIAGNOSIS — Z8249 Family history of ischemic heart disease and other diseases of the circulatory system: Secondary | ICD-10-CM

## 2024-08-08 DIAGNOSIS — Z6824 Body mass index (BMI) 24.0-24.9, adult: Secondary | ICD-10-CM

## 2024-08-08 DIAGNOSIS — F17290 Nicotine dependence, other tobacco product, uncomplicated: Secondary | ICD-10-CM | POA: Diagnosis present

## 2024-08-08 DIAGNOSIS — K5730 Diverticulosis of large intestine without perforation or abscess without bleeding: Secondary | ICD-10-CM | POA: Diagnosis not present

## 2024-08-08 DIAGNOSIS — Z923 Personal history of irradiation: Secondary | ICD-10-CM

## 2024-08-08 DIAGNOSIS — Z803 Family history of malignant neoplasm of breast: Secondary | ICD-10-CM

## 2024-08-08 DIAGNOSIS — Z9081 Acquired absence of spleen: Secondary | ICD-10-CM

## 2024-08-08 DIAGNOSIS — Z955 Presence of coronary angioplasty implant and graft: Secondary | ICD-10-CM

## 2024-08-08 DIAGNOSIS — Z85118 Personal history of other malignant neoplasm of bronchus and lung: Secondary | ICD-10-CM

## 2024-08-08 DIAGNOSIS — Z951 Presence of aortocoronary bypass graft: Secondary | ICD-10-CM

## 2024-08-08 DIAGNOSIS — Z8673 Personal history of transient ischemic attack (TIA), and cerebral infarction without residual deficits: Secondary | ICD-10-CM

## 2024-08-08 DIAGNOSIS — K219 Gastro-esophageal reflux disease without esophagitis: Secondary | ICD-10-CM | POA: Diagnosis present

## 2024-08-08 DIAGNOSIS — K551 Chronic vascular disorders of intestine: Secondary | ICD-10-CM | POA: Diagnosis not present

## 2024-08-08 LAB — CBC
HCT: 48.8 % (ref 39.0–52.0)
Hemoglobin: 16.3 g/dL (ref 13.0–17.0)
MCH: 31.6 pg (ref 26.0–34.0)
MCHC: 33.4 g/dL (ref 30.0–36.0)
MCV: 94.6 fL (ref 80.0–100.0)
Platelets: 239 K/uL (ref 150–400)
RBC: 5.16 MIL/uL (ref 4.22–5.81)
RDW: 13.6 % (ref 11.5–15.5)
WBC: 8 K/uL (ref 4.0–10.5)
nRBC: 0 % (ref 0.0–0.2)

## 2024-08-08 LAB — COMPREHENSIVE METABOLIC PANEL WITH GFR
ALT: 18 U/L (ref 0–44)
AST: 25 U/L (ref 15–41)
Albumin: 4.6 g/dL (ref 3.5–5.0)
Alkaline Phosphatase: 92 U/L (ref 38–126)
Anion gap: 14 (ref 5–15)
BUN: 49 mg/dL — ABNORMAL HIGH (ref 8–23)
CO2: 24 mmol/L (ref 22–32)
Calcium: 10.5 mg/dL — ABNORMAL HIGH (ref 8.9–10.3)
Chloride: 100 mmol/L (ref 98–111)
Creatinine, Ser: 1.59 mg/dL — ABNORMAL HIGH (ref 0.61–1.24)
GFR, Estimated: 44 mL/min — ABNORMAL LOW (ref >60)
Glucose, Bld: 198 mg/dL — ABNORMAL HIGH (ref 70–99)
Potassium: 4.9 mmol/L (ref 3.5–5.1)
Sodium: 138 mmol/L (ref 135–145)
Total Bilirubin: 0.7 mg/dL (ref 0.0–1.2)
Total Protein: 8.7 g/dL — ABNORMAL HIGH (ref 6.5–8.1)

## 2024-08-08 LAB — PROTIME-INR
INR: 2.8 — ABNORMAL HIGH (ref 0.8–1.2)
Prothrombin Time: 30.4 s — ABNORMAL HIGH (ref 11.4–15.2)

## 2024-08-08 LAB — OCCULT BLOOD X 1 CARD TO LAB, STOOL: Fecal Occult Bld: POSITIVE — AB

## 2024-08-08 MED ORDER — IOHEXOL 350 MG/ML SOLN
100.0000 mL | Freq: Once | INTRAVENOUS | Status: AC | PRN
Start: 2024-08-08 — End: 2024-08-08
  Administered 2024-08-08: 80 mL via INTRAVENOUS

## 2024-08-08 NOTE — ED Notes (Signed)
 Pt stated he has been to the bathroom at least 7 times since he checked in at this ED. Pt stated it is still a large amount of bright red blood each trip.

## 2024-08-08 NOTE — ED Notes (Signed)
 Hemoccult card taken to lab

## 2024-08-08 NOTE — ED Triage Notes (Signed)
 Arrives POV with complaints of having a large amount of red blood in his stool today. Patient reports that he is on coumadin  for an artificial heart valve.

## 2024-08-08 NOTE — ED Provider Notes (Signed)
 [place] EMERGENCY DEPARTMENT AT [facility] Provider Note   CSN: [id number] Arrival date & time: 08/08/24  1758     Patient presents with: Blood In Stools   [name] [name] is a 78 y.o. male.   Patient is a 78 year old male with a history of hypertension, hyperlipidemia, ITP, CAD status post CABG and aortic stenosis status post mechanical valve on chronic Coumadin  therapy, PVD, TIA, diabetes, bilateral renal artery stenosis and COPD and CHF on chronic Lasix  who is presenting today due to bloody stool.  He reports that he felt normal all day and at 430 he went to the bathroom and the toilet bowl was full of dark blood.  He reports that there was a 2-hour break from 430-630 and then he had another bloody bowel movement.  Since that time he has had 4 additional bloody stools even while he was waiting in the emergency room.  He denies any abdominal pain.  He has no chest pain or shortness of breath.  He reports just feeling a little bit tired.  No prior history of GI bleeding in the past except after he had a polyp removed and the stitch came loose.  He last took his Coumadin  this morning at his regular time and his last INR he reports was a little bit low 2 weeks ago.  He denies any rectal pain.  He states that he does not know what the stool color was because there was so much blood in the toilet.  The history is provided by the patient.       Prior to Admission medications   Medication Sig Start Date End Date Taking? Authorizing Provider  amiodarone  (PACERONE ) 200 MG tablet Take 200 mg by mouth in the morning.    [provider]  aspirin  EC 81 MG tablet Take 1 tablet (81 mg total) by mouth daily. 04/27/24   Ladona Heinz, MD  atorvastatin  (LIPITOR ) 80 MG tablet Take 80 mg by mouth in the morning.    [provider]  Blood Glucose Monitoring Suppl (ONETOUCH VERIO IQ SYSTEM) w/Device KIT  11/23/19   [provider]  carvedilol  (COREG ) 12.5 MG tablet Take 1  tablet (12.5 mg total) by mouth 2 (two) times daily. 04/27/24   Ladona Heinz, MD  enoxaparin  (LOVENOX ) 120 MG/0.8ML injection Inject 0.8 mLs (120 mg total) into the skin daily. Start tomorrow 03/25/24 Patient taking differently: Inject 120 mg into the skin daily. Start tomorrow 03/25/24 as needed 03/25/24   Ladona Heinz, MD  Evolocumab 140 MG/ML SOAJ Inject 140 mg into the skin every 14 (fourteen) days.    [provider]  furosemide  (LASIX ) 20 MG tablet Take 2 tablets (40 mg total) by mouth daily. May take extra 20 mg as needed for wt gain, swelling, or increase shortness of breath. 03/02/24   Lee, [place], NP  glucose blood (ONETOUCH VERIO) test strip  02/04/15   [provider]  glyBURIDE  (DIABETA ) 2.5 MG tablet Take 5 mg by mouth 2 (two) times daily with a meal.    [provider]  JARDIANCE  25 MG TABS tablet Take 25 mg by mouth in the morning. 03/17/23   [provider]  nitroGLYCERIN  (NITROSTAT ) 0.4 MG SL tablet Place 1 tablet (0.4 mg total) under the tongue every 5 (five) minutes x 3 doses as needed for chest pain. 09/19/19   Windle Fret, NP  OneTouch Delica Lancets 33G MISC  11/23/19   [provider]  sacubitril -valsartan  (ENTRESTO ) 24-26 MG TAKE 1 TABLET  BY MOUTH TWICE A DAY 04/04/24   Lee, [place], NP  spironolactone  (ALDACTONE ) 25 MG tablet TAKE 1 TABLET BY MOUTH EVERY DAY 04/19/24   McLean, Dalton S, MD  warfarin (COUMADIN ) 5 MG tablet Take 1/2 tablet to 1 tablet by mouth daily as prescribed by the coumadin  clinic. 06/16/24   Ladona Heinz, MD    Allergies: Patient has no known allergies.    Review of Systems  Updated Vital Signs BP (!) 134/54 (BP Location: Left Arm)   Pulse (!) 108   Temp 98.1 F (36.7 C) (Oral)   Resp 16   Ht 5' 8 (1.727 m)   Wt 73 kg   SpO2 95%   BMI 24.48 kg/m   Physical Exam Vitals and nursing note reviewed.  Constitutional:      General: He is not in acute distress.    Appearance: He is well-developed.  HENT:      Head: Normocephalic and atraumatic.  Eyes:     Conjunctiva/sclera: Conjunctivae normal.     Pupils: Pupils are equal, round, and reactive to light.  Cardiovascular:     Rate and Rhythm: Normal rate and regular rhythm.     Heart sounds: No murmur heard. Pulmonary:     Effort: Pulmonary effort is normal. No respiratory distress.     Breath sounds: Normal breath sounds. No wheezing or rales.  Abdominal:     General: There is no distension.     Palpations: Abdomen is soft.     Tenderness: There is no abdominal tenderness. There is no guarding or rebound.  Genitourinary:    Comments: Dark blood present around the rectum and down the legs.  No evidence of external hemorrhoids and no rectal pain. Musculoskeletal:        General: No tenderness. Normal range of motion.     Cervical back: Normal range of motion and neck supple.  Skin:    General: Skin is warm and dry.     Findings: No erythema or rash.  Neurological:     Mental Status: He is alert and oriented to person, place, and time. Mental status is at baseline.  Psychiatric:        Mood and Affect: Mood normal.        Behavior: Behavior normal.     (all labs ordered are listed, but only abnormal results are displayed) Labs Reviewed  COMPREHENSIVE METABOLIC PANEL WITH GFR - Abnormal; Notable for the following components:      Result Value   Glucose, Bld 198 (*)    BUN 49 (*)    Creatinine, Ser 1.59 (*)    Calcium  10.5 (*)    Total Protein 8.7 (*)    GFR, Estimated 44 (*)    All other components within normal limits  PROTIME-INR - Abnormal; Notable for the following components:   Prothrombin Time 30.4 (*)    INR 2.8 (*)    All other components within normal limits  OCCULT BLOOD X 1 CARD TO LAB, STOOL - Abnormal; Notable for the following components:   Fecal Occult Bld POSITIVE (*)    All other components within normal limits  CBC    EKG: None  Radiology: CT ANGIO GI BLEED Result Date: 08/08/2024 CLINICAL DATA:  Lower  GI bleeding. Rectal bleeding. History of non-small cell lung cancer. EXAM: CTA ABDOMEN AND PELVIS WITHOUT AND WITH CONTRAST TECHNIQUE: Multidetector CT imaging of the abdomen and pelvis was performed using the standard protocol during bolus administration of intravenous contrast. Multiplanar reconstructed  images and MIPs were obtained and reviewed to evaluate the vascular anatomy. RADIATION DOSE REDUCTION: This exam was performed according to the departmental dose-optimization program which includes automated exposure control, adjustment of the mA and/or kV according to patient size and/or use of iterative reconstruction technique. CONTRAST:  80mL OMNIPAQUE  IOHEXOL  350 MG/ML SOLN COMPARISON:  CT chest 07/26/2024. PET-CT 12/13/2020. FINDINGS: VASCULAR Aorta: No evidence for aneurysm or dissection. Aorto bi-iliac bypass graft is present with anastomoses in the bilateral common femoral arteries. The aortic portion of the graft is patent. There is no significant stenosis. Aorta is heavily calcified. Celiac: Patent without evidence of aneurysm, dissection, vasculitis or significant stenosis. Calcified atherosclerotic disease present. SMA: Severe atherosclerotic disease present. There is a occlusion of the proximal 2.5 cm of the superior mesenteric artery. The mid and distal artery appear patent. Renals: Severe atherosclerotic disease present. There severe stenosis of the origin of the bilateral renal arteries. No evidence for dissection or aneurysm. IMA: Not visualized. Inflow: The bilateral iliac grafts are widely patent. The native bilateral external and internal iliac arteries are occluded. Proximal Outflow: Right common femoral and proximal profunda femoral artery appear within normal limits and patent. Profunda femoral artery graft present. There is aneurysmal dilatation of the proximal aspect of the left profunda femoral artery measuring 5.3 cm in length and 2.2 by 2.4 cm in largest AP and transverse dimensions.  There some surrounding thickening of the arterial wall at this level. The common femoral artery and superficial femoral artery appear patent and within normal limits. Veins: No obvious venous abnormality within the limitations of this arterial phase study. Review of the MIP images confirms the above findings. NON-VASCULAR Lower chest: There smooth interlobular septal thickening and ground-glass opacities in the lung bases with minimal patchy airspace opacities in the right lung base. Findings are unchanged from 07/26/2024. Hepatobiliary: No focal liver abnormality is seen. No gallstones, gallbladder wall thickening, or biliary dilatation. Pancreas: Unremarkable. No pancreatic ductal dilatation or surrounding inflammatory changes. Spleen: Spleen is not visualized. Adrenals/Urinary Tract: Adrenal glands are unremarkable. Kidneys are normal, without renal calculi, focal lesion, or hydronephrosis. Bladder is unremarkable. Stomach/Bowel: There is a small amount of acute hemorrhage of the anorectal junction on the right. No other areas of hemorrhage are identified. There is no bowel obstruction, wall thickening, pneumatosis or free air. The appendix is within normal limits. There is diffuse colonic diverticulosis. The stomach is normal. Lymphatic: No enlarged lymph nodes are identified. Reproductive: Prostate is mildly enlarged. Other: No abdominal wall hernia or abnormality. No abdominopelvic ascites. Musculoskeletal: No acute or significant osseous findings. IMPRESSION: 1. Small amount of acute hemorrhage at the anorectal junction on the right. 2. Aorto bi-iliac bypass graft is patent. 3. 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 The mid and distal artery appear patent. 4. Severe stenosis of the origin of the bilateral renal arteries. 5. Aneurysmal dilatation of the proximal left profunda femoral artery measuring 5.3 x 2.2 x  2.4 cm. There is some surrounding thickening of the arterial wall at this level. 6. Colonic diverticulosis. 7. Stable interlobular septal thickening and ground-glass opacities in the lung bases with minimal patchy airspace opacities in the right lung base. Findings may be related to pulmonary edema or infection. Aortic Atherosclerosis (ICD10-I70.0). Electronically Signed   By: Greig Pique M.D.   On: 08/08/2024 23:13     Procedures   Medications Ordered in the ED  iohexol  (OMNIPAQUE ) 350 MG/ML injection  100 mL (80 mLs Intravenous Contrast Given 08/08/24 2232)                                    Medical Decision Making Amount and/or Complexity of Data Reviewed Labs: ordered. Decision-making details documented in ED Course. Radiology: ordered and independent interpretation performed. Decision-making details documented in ED Course.  Risk Prescription drug management.   Pt with multiple medical problems and comorbidities and presenting today with a complaint that caries a high risk for morbidity and mortality.  Here today with lower GI bleeding.  Patient is chronically anticoagulated due to a mechanical heart valve.  He has no abdominal pain at this time and is well-appearing with normal vital signs.  Patient does have evidence of dark blood at the rectum but no evidence of hemorrhoids.  Patient will need admission for further evaluation of the GI bleeding.  I independently interpreted patient's labs and Hemoccult is positive, CMP with elevated BUN and a creatinine of 1.59 which is his baseline, electrolytes are normal, CBC with hemoglobin of 16 and normal white count, INR is therapeutic at 2.8.  Patient will need consult by [facility] who he has seen in the past and note was sent to Dr. Stacia who reports the morning team will see him.  Spoke with the hospitalist for admission and they recommended a CT GI bleed scan which was ordered. CT did show a small amount of acute hemorrhage at the  anorectal junction on the right but no other evidence of bleeding.  He remains hemodynamically stable.      Final diagnoses:  Acute lower GI bleeding    ED Discharge Orders     None          Doretha Folks, MD 08/08/24 2324

## 2024-08-08 NOTE — Plan of Care (Signed)
 Plan of Care Note for accepted transfer   Patient name: [name] [name] FMW:[id number] DOB: [birth year]  Facility requesting transfer: [facility] ED Requesting Provider: Dr. Doretha Facility course: 78 year old male with history of lung cancer, permanent A-fib/flutter, chronic HFrEF>> HFimpEF, CAD, aortic stenosis status post mechanical aortic valve replacement on warfarin, ITP, PAD, COPD, hypertension, hyperlipidemia, CVA/TIA, type 2 diabetes, CKD stage IIIa, diverticulosis presenting due to having multiple of hematochezia since 4:30 PM today.  1 episode of hematochezia at home and additional 4 episodes since arrival to the ED.  Hemodynamically stable.  Hemoglobin 16.3 on initial labs, INR 2.8, FOBT positive.  CT GI bleed study showing: IMPRESSION: 1. Small amount of acute hemorrhage at the anorectal junction on the right. 2. Aorto bi-iliac bypass graft is patent. 3. 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 The mid and distal artery appear patent. 4. Severe stenosis of the origin of the bilateral renal arteries. 5. Aneurysmal dilatation of the proximal left profunda femoral artery measuring 5.3 x 2.2 x 2.4 cm. There is some surrounding thickening of the arterial wall at this level. 6. Colonic diverticulosis. 7. Stable interlobular septal thickening and ground-glass opacities in the lung bases with minimal patchy airspace opacities in the right lung base. Findings may be related to pulmonary edema or infection.   Aortic Atherosclerosis (ICD10-I70.0).  No fever or leukocytosis.  Not hypoxic.  ED physician has messaged on-call physician for [facility] (Dr. Stacia) on secure chat requesting consultation.  Plan of care: The patient is accepted for admission to Progressive unit at [facility].  [facility] will assume care on arrival to accepting facility. Until arrival, care as  per EDP. However, [facility] available 24/7 for questions and assistance.  Check [email or web address] for on-call coverage.  Nursing staff, please call [facility] Admits & Consults System-Wide number under Amion on patient's arrival so appropriate admitting provider can evaluate the pt.

## 2024-08-08 NOTE — ED Notes (Signed)
 Patient transported to CT

## 2024-08-08 NOTE — ED Notes (Signed)
 CL Called for transport spoke with [name] [name]

## 2024-08-09 DIAGNOSIS — Z7901 Long term (current) use of anticoagulants: Secondary | ICD-10-CM

## 2024-08-09 DIAGNOSIS — K922 Gastrointestinal hemorrhage, unspecified: Secondary | ICD-10-CM | POA: Diagnosis present

## 2024-08-09 DIAGNOSIS — Z85118 Personal history of other malignant neoplasm of bronchus and lung: Secondary | ICD-10-CM

## 2024-08-09 DIAGNOSIS — Z951 Presence of aortocoronary bypass graft: Secondary | ICD-10-CM

## 2024-08-09 DIAGNOSIS — K921 Melena: Secondary | ICD-10-CM | POA: Diagnosis not present

## 2024-08-09 DIAGNOSIS — N183 Chronic kidney disease, stage 3 unspecified: Secondary | ICD-10-CM | POA: Diagnosis not present

## 2024-08-09 DIAGNOSIS — Z743 Need for continuous supervision: Secondary | ICD-10-CM | POA: Diagnosis not present

## 2024-08-09 DIAGNOSIS — Z860101 Personal history of adenomatous and serrated colon polyps: Secondary | ICD-10-CM | POA: Diagnosis not present

## 2024-08-09 DIAGNOSIS — I2510 Atherosclerotic heart disease of native coronary artery without angina pectoris: Secondary | ICD-10-CM

## 2024-08-09 DIAGNOSIS — I5030 Unspecified diastolic (congestive) heart failure: Secondary | ICD-10-CM | POA: Insufficient documentation

## 2024-08-09 DIAGNOSIS — R0902 Hypoxemia: Secondary | ICD-10-CM | POA: Diagnosis not present

## 2024-08-09 DIAGNOSIS — Z952 Presence of prosthetic heart valve: Secondary | ICD-10-CM | POA: Diagnosis not present

## 2024-08-09 DIAGNOSIS — K55069 Acute infarction of intestine, part and extent unspecified: Secondary | ICD-10-CM

## 2024-08-09 DIAGNOSIS — I10 Essential (primary) hypertension: Secondary | ICD-10-CM | POA: Diagnosis not present

## 2024-08-09 LAB — BASIC METABOLIC PANEL WITH GFR
Anion gap: 13 (ref 5–15)
BUN: 48 mg/dL — ABNORMAL HIGH (ref 8–23)
CO2: 24 mmol/L (ref 22–32)
Calcium: 9.1 mg/dL (ref 8.9–10.3)
Chloride: 104 mmol/L (ref 98–111)
Creatinine, Ser: 1.67 mg/dL — ABNORMAL HIGH (ref 0.61–1.24)
GFR, Estimated: 42 mL/min — ABNORMAL LOW (ref >60)
Glucose, Bld: 156 mg/dL — ABNORMAL HIGH (ref 70–99)
Potassium: 4.8 mmol/L (ref 3.5–5.1)
Sodium: 141 mmol/L (ref 135–145)

## 2024-08-09 LAB — CBC
HCT: 40.2 % (ref 39.0–52.0)
Hemoglobin: 13.2 g/dL (ref 13.0–17.0)
MCH: 31.2 pg (ref 26.0–34.0)
MCHC: 32.8 g/dL (ref 30.0–36.0)
MCV: 95 fL (ref 80.0–100.0)
Platelets: 214 K/uL (ref 150–400)
RBC: 4.23 MIL/uL (ref 4.22–5.81)
RDW: 13.8 % (ref 11.5–15.5)
WBC: 7.5 K/uL (ref 4.0–10.5)
nRBC: 0 % (ref 0.0–0.2)

## 2024-08-09 LAB — LACTIC ACID, PLASMA: Lactic Acid, Venous: 1 mmol/L (ref 0.5–1.9)

## 2024-08-09 LAB — HEMOGLOBIN AND HEMATOCRIT, BLOOD
HCT: 42.4 % (ref 39.0–52.0)
HCT: 39.8 % (ref 39.0–52.0)
Hemoglobin: 13.9 g/dL (ref 13.0–17.0)
Hemoglobin: 12.8 g/dL — ABNORMAL LOW (ref 13.0–17.0)

## 2024-08-09 MED ORDER — ACETAMINOPHEN 650 MG RE SUPP: 650.0000 mg | Freq: Four times a day (QID) | RECTAL | Status: DC | PRN

## 2024-08-09 MED ORDER — ACETAMINOPHEN 325 MG PO TABS: 650.0000 mg | ORAL_TABLET | Freq: Four times a day (QID) | ORAL | Status: DC | PRN

## 2024-08-09 MED ORDER — SODIUM CHLORIDE 0.9 % IV SOLN: INTRAVENOUS | Status: AC

## 2024-08-09 MED ORDER — ONDANSETRON HCL 4 MG PO TABS
4.0000 mg | ORAL_TABLET | Freq: Four times a day (QID) | ORAL | Status: DC | PRN
Start: 2024-08-09 — End: 2024-08-11

## 2024-08-09 MED ORDER — CARVEDILOL 12.5 MG PO TABS
12.5000 mg | ORAL_TABLET | Freq: Two times a day (BID) | ORAL | Status: DC
  Administered 2024-08-09 – 2024-08-11 (×4): 12.5 mg via ORAL
  Filled 2024-08-09 (×5): qty 1

## 2024-08-09 MED ORDER — AMIODARONE HCL 200 MG PO TABS
200.0000 mg | ORAL_TABLET | Freq: Every morning | ORAL | Status: DC
  Administered 2024-08-09 – 2024-08-11 (×3): 200 mg via ORAL
  Filled 2024-08-09 (×3): qty 1

## 2024-08-09 MED ORDER — AMIODARONE HCL 200 MG PO TABS: 200.0000 mg | ORAL_TABLET | Freq: Every morning | ORAL | Status: DC

## 2024-08-09 MED ORDER — ONDANSETRON HCL 4 MG/2ML IJ SOLN: 4.0000 mg | Freq: Four times a day (QID) | INTRAMUSCULAR | Status: DC | PRN

## 2024-08-09 NOTE — Assessment & Plan Note (Signed)
 [place] hx/o CAD s/p CABG 11/2003  No active CP  Holding ASA in setting of GI bleeding  Monitor

## 2024-08-09 NOTE — Assessment & Plan Note (Signed)
 Patient mechanical aortic valve on Coumadin  Will hold Coumadin  in the setting of active GI bleeding Monitor Consult cardiology as appropriate

## 2024-08-09 NOTE — Assessment & Plan Note (Addendum)
 Noted baseline extensive history of peripheral vascular disease with Aortobifemoral bypass in 2016 with follow up iliac stenting and femoral endarterectomy. S/p ostial and mid right SFA intervention 2017

## 2024-08-09 NOTE — Assessment & Plan Note (Signed)
 Blood sugar in the 150s SSI A1c Monitor

## 2024-08-09 NOTE — TOC CM/SW Note (Signed)
 Transition of Care [facility]) - Inpatient Brief Assessment   Patient Details  Name: Ilan Kahrs MRN: [id number] Date of Birth: Sep 07, 1946  Transition of Care [facility]) CM/SW Contact:    Sudie Erminio Deems, RN Phone Number: 08/09/2024, 3:06 PM   Clinical Narrative: Patient presented for blood in stools. PTA patient states he is independent from home alone. Patient has PCP and can get to appointments. No home needs identified at this time.    Transition of Care Asessment: Insurance and Status: Insurance coverage has been reviewed Patient has primary care physician: Yes Home environment has been reviewed: reviewed Prior level of function:: independent Prior/Current Home Services: No current home services Social Drivers of Health Review: SDOH reviewed no interventions necessary Readmission risk has been reviewed: Yes Transition of care needs: no transition of care needs at this time

## 2024-08-09 NOTE — Care Management Obs Status (Signed)
 MEDICARE OBSERVATION STATUS NOTIFICATION   Patient Details  Name: [name] [name] MRN: [id number] Date of Birth: 1946/11/29   Medicare Observation Status Notification Given:       Vonzell Arrie Sharps 08/09/2024, 8:45 AM

## 2024-08-09 NOTE — Consult Note (Signed)
 Consultation Note   Referring Provider:  Emergency Services PCP: Onita Rush, MD Primary Gastroenterologist:   Gwendlyn Buddy, MD ( now retired)        Reason for Consultation:  Lower gastrointestinal bleeding DOA: 08/08/2024         Hospital Day: 2   ASSESSMENT    78 yo male with multiple medical problems admitted with painless lower GI bleeding on warfarin. CT angiogram showed a small amount of acute hemorrhage at the anorectal junction on the right but no other evidence of bleeding.  This could be  hemorrhoidal bleeding though volume sounds more than that to be expected. .  Presenting hemoglobin 16.3 (at baseline), down to 13.2 overnight (does not seem to have received IV fluids)  Involuntary weight loss Unexplained weight loss of up approximately 10 pounds over the last several months due to diminished appetite.  The only new medication that he has started has been amiodarone .   History of adenomatous colon polyps History of post-polypectomy bleed at time of last colonoscopy in 2020 It was previously decided following his last colonoscopy there would be no further colonoscopies due to his age and and co-morbidities    Aortic stenosis s/p mechanical AVR on warfarin HFrEF CAD s/p CABG INR therapeutic at 2.8  COPD History of lung cancer s/p XRT ~ 3 yrs ago  PAD  CKD3a  History of CVA / TIA  See PMH for additional medical problems.    PLAN:   -- Monitor H&H -- Will give clear liquid diet today -- Patient is at increased risk for sedation/procedures so we will try to avoid.  However, depending on clinical course he may need flexible sigmoidoscopy/colonoscopy.   HPI   Patient is a 78 year old male with multiple medical problems.  Yesterday afternoon he had a bowel movement which was followed by onset of lower GI bleeding which he describes as bright red blood.  Bleeding was not associated with any abdominal pain or rectal  pain . Though he has had some recent minor constipation he was not particularly constipated at the time /was not straining.  He continued to have episodes of bleeding and decided to present to [facility] where he had several more episodes of bleeding.  CT angio showed small amount of bleeding at the anorectal junction.  His presenting hemoglobin was around 16 which is at baseline.  Patient was transferred to [facility] late last night.  He had another episode of bleeding around the time of transfer.  No further bleeding until around 4 AM when he had another episode of bleeding which was much smaller in volume.  He is anticoagulated with warfarin, INR is therapeutic at 2.8.  He takes ibuprofen but only 1 dose every several weeks.  In addition to not having any abdominal pain he has not had any nausea nor vomiting though he does report unexplained weight loss of about 10 pounds over the last few months.  He he did start amiodarone  recently but no other new medications.    Prior GI Studies   **List of most recent procedures  01/07/2022 EGD for epigastric pain - Normal esophagus.  - Small hiatal hernia.  - Erythematous mucosa in the gastric body. Biopsied.  - Normal duodenal  bulb and second portion of the duodenum  09/03/2019 colonoscopy for hematochezia, postpolypectomy bleed -- Fair prep.  Diverticulosis in the entire colon.  A single solitary ulcer in the cecum.  A single solitary ulcer in the cecum.  A single solitary ulcer in the proximal descending colon.  Stool in the entire examined colon.  Examination otherwise normal  08/30/19 colonoscopy for iron deficiency anemia and history of colon polyps --Exam adequate after an extensive lavage and suctioning -- One 8 mm polyp in the cecum, removed with a cold snare. Resected and retrieved.  - One 10 mm polyp in the cecum, removed with a hot snare. Resected and retrieved. - - One 8 mm polyp in the transverse colon, removed with a hot snare. Resected and  retrieved.  - One 6 mm polyp in the descending colon, removed with a cold snare. Resected and retrieved. 2 clips placed for hemostasis.  - Moderate diverticulosis in the entire examined colon. - Internal hemorrhoids.  - The examination was otherwise normal on direct and retroflexion views.  08/30/2019 EGD for iron deficiency anemia and history of PUD - Normal esophagus - Gastritis.  Biopsied - Normal duodenal bulb and second portion of the duodenum  Diagnosis 1. Surgical [P], colon, cecum, polyp (2) - TUBULAR ADENOMA(S). - HIGH GRADE DYSPLASIA IS NOT IDENTIFIED. 2. Surgical [P], colon, transverse, polyp - TUBULAR ADENOMA(S). - HIGH GRADE DYSPLASIA IS NOT IDENTIFIED. 3. Surgical [P], colon, descending, polyp - BENIGN POLYPOID COLORECTAL MUCOSA. - THERE IS NO EVIDENCE OF MALIGNANCY. 4. Surgical [P], gastric body - CHRONIC INACTIVE GASTRITIS, MILD. - THERE IS NO EVIDENCE OF HELICOBACTER PYLORI, DYSPLASIA OR MALIGNANCY. - SEE COMMENT.  Labs and Imaging:  Recent Labs    08/08/24 1857  PROT 8.7*  ALBUMIN  4.6  AST 25  ALT 18  ALKPHOS 92  BILITOT 0.7   Recent Labs    08/08/24 1857  WBC 8.0  HGB 16.3  HCT 48.8  MCV 94.6  PLT 239   Recent Labs    08/08/24 1857  NA 138  K 4.9  CL 100  CO2 24  GLUCOSE 198*  BUN 49*  CREATININE 1.59*  CALCIUM  10.5*     CT ANGIO GI BLEED CLINICAL DATA:  Lower GI bleeding. Rectal bleeding. History of non-small cell lung cancer.  EXAM: CTA ABDOMEN AND PELVIS WITHOUT AND WITH CONTRAST  TECHNIQUE: Multidetector CT imaging of the abdomen and pelvis was performed using the standard protocol during bolus administration of intravenous contrast. Multiplanar reconstructed images and MIPs were obtained and reviewed to evaluate the vascular anatomy.  RADIATION DOSE REDUCTION: This exam was performed according to the departmental dose-optimization program which includes automated exposure control, adjustment of the mA and/or kV  according to patient size and/or use of iterative reconstruction technique.  CONTRAST:  80mL OMNIPAQUE  IOHEXOL  350 MG/ML SOLN  COMPARISON:  CT chest 07/26/2024. PET-CT 12/13/2020.  FINDINGS: VASCULAR  Aorta: No evidence for aneurysm or dissection. Aorto bi-iliac bypass graft is present with anastomoses in the bilateral common femoral arteries. The aortic portion of the graft is patent. There is no significant stenosis. Aorta is heavily calcified.  Celiac: Patent without evidence of aneurysm, dissection, vasculitis or significant stenosis. Calcified atherosclerotic disease present.  SMA: Severe atherosclerotic disease present. There is a occlusion of the proximal 2.5 cm of the superior mesenteric artery. The mid and distal artery appear patent.  Renals: Severe atherosclerotic disease present. There severe stenosis of the origin of the bilateral renal arteries. No evidence for dissection or aneurysm.  IMA: Not visualized.  Inflow: The bilateral iliac grafts are widely patent. The native bilateral external and internal iliac arteries are occluded.  Proximal Outflow: Right common femoral and proximal profunda femoral artery appear within normal limits and patent. Profunda femoral artery graft present. There is aneurysmal dilatation of the proximal aspect of the left profunda femoral artery measuring 5.3 cm in length and 2.2 by 2.4 cm in largest AP and transverse dimensions. There some surrounding thickening of the arterial wall at this level. The common femoral artery and superficial femoral artery appear patent and within normal limits.  Veins: No obvious venous abnormality within the limitations of this arterial phase study.  Review of the MIP images confirms the above findings.  NON-VASCULAR  Lower chest: There smooth interlobular septal thickening and ground-glass opacities in the lung bases with minimal patchy airspace opacities in the right lung base. Findings are  unchanged from 07/26/2024.  Hepatobiliary: No focal liver abnormality is seen. No gallstones, gallbladder wall thickening, or biliary dilatation.  Pancreas: Unremarkable. No pancreatic ductal dilatation or surrounding inflammatory changes.  Spleen: Spleen is not visualized.  Adrenals/Urinary Tract: Adrenal glands are unremarkable. Kidneys are normal, without renal calculi, focal lesion, or hydronephrosis. Bladder is unremarkable.  Stomach/Bowel: There is a small amount of acute hemorrhage of the anorectal junction on the right. No other areas of hemorrhage are identified. There is no bowel obstruction, wall thickening, pneumatosis or free air. The appendix is within normal limits. There is diffuse colonic diverticulosis. The stomach is normal.  Lymphatic: No enlarged lymph nodes are identified.  Reproductive: Prostate is mildly enlarged.  Other: No abdominal wall hernia or abnormality. No abdominopelvic ascites.  Musculoskeletal: No acute or significant osseous findings.  IMPRESSION: 1. Small amount of acute hemorrhage at the anorectal junction on the right. 2. Aorto bi-iliac bypass graft is patent. 3. 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 The mid and distal artery appear patent. 4. Severe stenosis of the origin of the bilateral renal arteries. 5. Aneurysmal dilatation of the proximal left profunda femoral artery measuring 5.3 x 2.2 x 2.4 cm. There is some surrounding thickening of the arterial wall at this level. 6. Colonic diverticulosis. 7. Stable interlobular septal thickening and ground-glass opacities in the lung bases with minimal patchy airspace opacities in the right lung base. Findings may be related to pulmonary edema or infection.  Aortic Atherosclerosis (ICD10-I70.0).  Electronically Signed   By: Greig Pique M.D.   On: 08/08/2024 23:13    Past  Medical History:  Diagnosis Date   Adenomatous colon polyp 09/1997   Anemia    Aortic stenosis    s/p [facility] mechanical AVR - Chronic Coumadin    Blood transfusion    related to ITP   CHF (congestive heart failure) (HCC)    COPD (chronic obstructive pulmonary disease) (HCC)    patient denies this dx on 01/27/21   Coronary artery disease    s/p cabg x 3 11/2003: lima-lad, seq vg to rpda and rpl   Diverticulitis of colon    Dysrhythmia    a-fib   GERD (gastroesophageal reflux disease)    patient states he has never had reflux   Heart murmur    History of radiation therapy 02/18/2021, 02/20/2021, 02/25/2021   SBRT to left lung     Dr Lynwood Nasuti   Hyperlipidemia    Hypertension    ITP (idiopathic thrombocytopenic purpura)    Lung cancer (  HCC)    last week   Peripheral arterial disease (HCC)    a. history of aortobifemoral bypass grafting by Dr. Krystal early b. LE angiography 04/22/2015 patent aortobifem graft, DES to R SFA   Peripheral vascular disease (HCC)    s/p Left external Iliac Artery stenting and subsequent left femoral endarterectomy 02/2011- post op course complicated by wound infxn req I&D 03/2011   Renal artery stenosis, native, bilateral (HCC)    a. bilateral renal artery stenosis by recent duplex ultrasound b. L renal artery stent 02/2015, R renal artery patent on angiogram   Stroke [facility]) 2015   TIA (transient ischemic attack) ~ 2013   Type II diabetes mellitus (HCC)     Past Surgical History:  Procedure Laterality Date   ABDOMINAL AORTAGRAM N/A 12/16/2011   Procedure: ABDOMINAL EZELLA;  Surgeon: Ozell Fell, MD;  Location: [facility] CATH LAB;  Service: Cardiovascular;  Laterality: N/A;   ABDOMINAL AORTOGRAM W/LOWER EXTREMITY Right 02/29/2020   Procedure: ABDOMINAL AORTOGRAM W/LOWER EXTREMITY;  Surgeon: Court Dorn PARAS, MD;  Location: [facility] INVASIVE CV LAB;  Service: Cardiovascular;  Laterality: Right;   ANGIOPLASTY / STENTING ILIAC     Left external Iliac Artery   AORTA  - BILATERAL FEMORAL ARTERY BYPASS GRAFT  01/18/2012   Procedure: AORTA BIFEMORAL BYPASS GRAFT;  Surgeon: Krystal Doing, MD;  Location: [facility] OR;  Service: Vascular;  Laterality: N/A;   AORTIC VALVE REPLACEMENT  ~ 2004   ATRIAL FIBRILLATION ABLATION N/A 11/20/2020   Procedure: ATRIAL FIBRILLATION ABLATION;  Surgeon: Inocencio Soyla Lunger, MD;  Location: [facility] INVASIVE CV LAB;  Service: Cardiovascular;  Laterality: N/A;   BRONCHIAL BIOPSY  01/28/2021   Procedure: BRONCHIAL BIOPSIES;  Surgeon: Brenna Adine CROME, DO;  Location: [facility] ENDOSCOPY;  Service: Pulmonary;;   BRONCHIAL BRUSHINGS  01/28/2021   Procedure: BRONCHIAL BRUSHINGS;  Surgeon: Brenna Adine CROME, DO;  Location: [facility] ENDOSCOPY;  Service: Pulmonary;;   BRONCHIAL NEEDLE ASPIRATION BIOPSY  01/28/2021   Procedure: BRONCHIAL NEEDLE ASPIRATION BIOPSIES;  Surgeon: Brenna Adine CROME, DO;  Location: [facility] ENDOSCOPY;  Service: Pulmonary;;   BRONCHIAL WASHINGS  01/28/2021   Procedure: BRONCHIAL WASHINGS;  Surgeon: Brenna Adine CROME, DO;  Location: [facility] ENDOSCOPY;  Service: Pulmonary;;   BRONCHIAL WASHINGS  03/19/2023   Procedure: BRONCHIAL WASHINGS;  Surgeon: Claudene Toribio BROCKS, MD;  Location: [facility] ENDOSCOPY;  Service: Pulmonary;;   CARDIAC CATHETERIZATION  11/2003   /pt report 10/01/2016   CARDIAC VALVE REPLACEMENT  11/2003   aortic   CARDIOVERSION N/A 10/16/2019   Procedure: CARDIOVERSION;  Surgeon: Rolan Ezra RAMAN, MD;  Location: [facility] ENDOSCOPY;  Service: Cardiovascular;  Laterality: N/A;   CARDIOVERSION N/A 01/01/2021   Procedure: CARDIOVERSION;  Surgeon: Rolan Ezra RAMAN, MD;  Location: [facility] ENDOSCOPY;  Service: Cardiovascular;  Laterality: N/A;   CARDIOVERSION N/A 01/18/2024   Procedure: CARDIOVERSION;  Surgeon: Rolan Ezra RAMAN, MD;  Location: [facility] INVASIVE CV LAB;  Service: Cardiovascular;  Laterality: N/A;   CARDIOVERSION N/A 02/21/2024   Procedure: CARDIOVERSION;  Surgeon: Rolan Ezra RAMAN, MD;  Location: [facility] INVASIVE CV LAB;  Service: Cardiovascular;  Laterality: N/A;    CATARACT EXTRACTION W/ INTRAOCULAR LENS  IMPLANT, BILATERAL Bilateral    COLONOSCOPY     COLONOSCOPY WITH PROPOFOL  N/A 09/03/2019   Procedure: COLONOSCOPY WITH PROPOFOL ;  Surgeon: Legrand Victory CROME DOUGLAS, MD;  Location: [facility] ENDOSCOPY;  Service: Gastroenterology;  Laterality: N/A;   CORONARY ARTERY BYPASS GRAFT  11/2003   thelbert 04/21/2011   CORONARY ATHERECTOMY N/A 10/10/2019   Procedure: CORONARY ATHERECTOMY;  Surgeon: Fell,  Ozell, MD;  Location: [facility] INVASIVE CV LAB;  Service: Cardiovascular;  Laterality: N/A;   CORONARY STENT INTERVENTION N/A 10/10/2019   Procedure: CORONARY STENT INTERVENTION;  Surgeon: Wonda Ozell, MD;  Location: [facility] INVASIVE CV LAB;  Service: Cardiovascular;  Laterality: N/A;   CORONARY/GRAFT ANGIOGRAPHY N/A 09/18/2019   Procedure: CORONARY/GRAFT ANGIOGRAPHY;  Surgeon: Court Dorn PARAS, MD;  Location: [facility] INVASIVE CV LAB;  Service: Cardiovascular;  Laterality: N/A;   ESOPHAGOGASTRODUODENOSCOPY (EGD) WITH PROPOFOL  N/A 02/27/2019   Procedure: ESOPHAGOGASTRODUODENOSCOPY (EGD) WITH PROPOFOL ;  Surgeon: Legrand Victory LITTIE DOUGLAS, MD;  Location: [facility] ENDOSCOPY;  Service: Endoscopy;  Laterality: N/A;   FIDUCIAL MARKER PLACEMENT  01/28/2021   Procedure: FIDUCIAL MARKER PLACEMENT;  Surgeon: Brenna Adine LITTIE, DO;  Location: [facility] ENDOSCOPY;  Service: Pulmonary;;   HEMOSTASIS CLIP PLACEMENT  09/03/2019   Procedure: HEMOSTASIS CLIP PLACEMENT;  Surgeon: Legrand Victory LITTIE DOUGLAS, MD;  Location: [facility];  Service: Gastroenterology;;   LOWER EXTREMITY ANGIOGRAM N/A 02/21/2015   Procedure: LOWER EXTREMITY ANGIOGRAM;  Surgeon: Dorn PARAS Court, MD;  Location: [facility] CATH LAB;  Service: Cardiovascular;  Laterality: N/A;   LOWER EXTREMITY ANGIOGRAPHY  03/24/2024   Procedure: Lower Extremity Angiography;  Surgeon: Ladona Heinz, MD;  Location: [facility] INVASIVE CV LAB;  Service: Cardiovascular;;   LOWER EXTREMITY INTERVENTION  03/24/2024   Procedure: LOWER EXTREMITY INTERVENTION;  Surgeon: Ladona Heinz, MD;  Location: [facility] INVASIVE  CV LAB;  Service: Cardiovascular;;   PERIPHERAL VASCULAR CATHETERIZATION N/A 04/22/2015   Procedure: Lower Extremity Angiography;  Surgeon: Dorn PARAS Court, MD;  Location: [facility] INVASIVE CV LAB;  Service: Cardiovascular;  Laterality: N/A;   PERIPHERAL VASCULAR CATHETERIZATION N/A 08/24/2016   Procedure: Lower Extremity Angiography;  Surgeon: Dorn PARAS Court, MD;  Location: [facility] INVASIVE CV LAB;  Service: Cardiovascular;  Laterality: N/A;   PERIPHERAL VASCULAR CATHETERIZATION Right 10/01/2016   Procedure: Peripheral Vascular Intervention - STENT;  Surgeon: Dorn PARAS Court, MD;  Location: [facility] INVASIVE CV LAB;  Service: Cardiovascular;  Laterality: Right;  Prox and MID SFA    PERIPHERAL VASCULAR INTERVENTION  02/29/2020   Procedure: PERIPHERAL VASCULAR INTERVENTION;  Surgeon: Court Dorn PARAS, MD;  Location: [facility] INVASIVE CV LAB;  Service: Cardiovascular;;  Right SFA   POLYPECTOMY     RENAL ANGIOGRAM N/A 02/21/2015   Procedure: RENAL ANGIOGRAM;  Surgeon: Dorn PARAS Court, MD;  Location: [facility] CATH LAB;  Service: Cardiovascular;  Laterality: Bilateral; 6 mm x 12 mm long Herculink balloon expandable stent to the left renal artery   RENAL ARTERY STENT Left 04/22/2015   dr berry   SPLENECTOMY  02/2003   thelbert 04/21/2011   TEE WITHOUT CARDIOVERSION N/A 10/16/2019   Procedure: TRANSESOPHAGEAL ECHOCARDIOGRAM (TEE);  Surgeon: Rolan Ezra RAMAN, MD;  Location: [facility] ENDOSCOPY;  Service: Cardiovascular;  Laterality: N/A;   TEMPORARY PACEMAKER N/A 10/10/2019   Procedure: TEMPORARY PACEMAKER;  Surgeon: Wonda Ozell, MD;  Location: [facility] INVASIVE CV LAB;  Service: Cardiovascular;  Laterality: N/A;   TONSILLECTOMY  ~ 1952   TRANSESOPHAGEAL ECHOCARDIOGRAM (CATH LAB) N/A 01/18/2024   Procedure: TRANSESOPHAGEAL ECHOCARDIOGRAM;  Surgeon: Rolan Ezra RAMAN, MD;  Location: [facility] INVASIVE CV LAB;  Service: Cardiovascular;  Laterality: N/A;   VIDEO BRONCHOSCOPY Left 03/19/2023   Procedure: VIDEO BRONCHOSCOPY WITHOUT FLUORO;  Surgeon:  Claudene Toribio BROCKS, MD;  Location: [facility] ENDOSCOPY;  Service: Pulmonary;  Laterality: Left;   VIDEO BRONCHOSCOPY WITH ENDOBRONCHIAL NAVIGATION N/A 01/28/2021   Procedure: VIDEO BRONCHOSCOPY WITH ENDOBRONCHIAL NAVIGATION;  Surgeon: Brenna Adine LITTIE, DO;  Location: [facility] ENDOSCOPY;  Service: Pulmonary;  Laterality: N/A;  Family History  Problem Relation Age of Onset   Coronary artery disease Mother        bypass surgery - deceased   Heart disease Father        murmur, valve replacement - deceased   Breast cancer Sister    Diabetes Other        grandmother   Diabetes Paternal Grandmother    Diabetes Paternal Aunt    Colon cancer Neg Hx    Colon polyps Neg Hx    Esophageal cancer Neg Hx    Rectal cancer Neg Hx    Stomach cancer Neg Hx     Prior to Admission medications   Medication Sig Start Date End Date Taking? Authorizing Provider  fluorouracil (EFUDEX) 5 % cream SMARTSIG:sparingly Topical Twice Daily 07/12/24  Yes [provider]  amiodarone  (PACERONE ) 200 MG tablet Take 200 mg by mouth in the morning.    [provider]  aspirin  EC 81 MG tablet Take 1 tablet (81 mg total) by mouth daily. 04/27/24   Ladona Heinz, MD  atorvastatin  (LIPITOR ) 80 MG tablet Take 80 mg by mouth in the morning.    [provider]  Blood Glucose Monitoring Suppl (ONETOUCH VERIO IQ SYSTEM) w/Device KIT  11/23/19   [provider]  carvedilol  (COREG ) 12.5 MG tablet Take 1 tablet (12.5 mg total) by mouth 2 (two) times daily. 04/27/24   Ladona Heinz, MD  enoxaparin  (LOVENOX ) 120 MG/0.8ML injection Inject 0.8 mLs (120 mg total) into the skin daily. Start tomorrow 03/25/24 Patient taking differently: Inject 120 mg into the skin daily. Start tomorrow 03/25/24 as needed 03/25/24   Ladona Heinz, MD  Evolocumab 140 MG/ML SOAJ Inject 140 mg into the skin every 14 (fourteen) days.    [provider]  furosemide  (LASIX ) 20 MG tablet Take 2 tablets (40 mg total) by mouth daily. May take extra 20  mg as needed for wt gain, swelling, or increase shortness of breath. 03/02/24   Lee, [place], NP  glucose blood (ONETOUCH VERIO) test strip  02/04/15   [provider]  glyBURIDE  (DIABETA ) 2.5 MG tablet Take 5 mg by mouth 2 (two) times daily with a meal.    [provider]  JARDIANCE  25 MG TABS tablet Take 25 mg by mouth in the morning. 03/17/23   [provider]  nitroGLYCERIN  (NITROSTAT ) 0.4 MG SL tablet Place 1 tablet (0.4 mg total) under the tongue every 5 (five) minutes x 3 doses as needed for chest pain. 09/19/19   Windle Fret, NP  OneTouch Delica Lancets 33G MISC  11/23/19   [provider]  sacubitril -valsartan  (ENTRESTO ) 24-26 MG TAKE 1 TABLET BY MOUTH TWICE A DAY 04/04/24   Lee, [place], NP  spironolactone  (ALDACTONE ) 25 MG tablet TAKE 1 TABLET BY MOUTH EVERY DAY 04/19/24   McLean, Dalton S, MD  warfarin (COUMADIN ) 5 MG tablet Take 1/2 tablet to 1 tablet by mouth daily as prescribed by the coumadin  clinic. 06/16/24   Ladona Heinz, MD    No current facility-administered medications for this encounter.    Allergies as of 08/08/2024   (No Known Allergies)    Social History   Socioeconomic History   Marital status: Divorced    Spouse name: Not on file   Number of children: 3   Years of education: Not on file   Highest education level: Not on file  Occupational History   Occupation: Retired  Tobacco Use   Smoking status: Former    Current packs/day:  0.00    Average packs/day: 1.5 packs/day for 30.0 years (45.0 ttl pk-yrs)    Types: Cigarettes, Cigars    Start date: 12/16/1963    Quit date: 12/15/1993    Years since quitting: 30.6   Smokeless tobacco: Never   Tobacco comments:    occasional cigar  Vaping Use   Vaping status: Never Used  Substance and Sexual Activity   Alcohol  use: Yes    Alcohol /week: 15.0 standard drinks of alcohol     Types: 1 Cans of beer, 7 Shots of liquor, 7 Standard drinks or equivalent per week    Comment: drinks 1  martini's a night (2 shots)   Drug use: No   Sexual activity: Yes  Other Topics Concern   Not on file  Social History Narrative   Tries to remain active.  Frequent golfer but claudication limits this.   1 child has passed   Social Drivers of Corporate investment banker Strain: Not on file  Food Insecurity: No Food Insecurity (08/09/2024)   Hunger Vital Sign    Worried About Running Out of Food in the Last Year: Never true    Ran Out of Food in the Last Year: Never true  Transportation Needs: No Transportation Needs (08/09/2024)   PRAPARE - Administrator, Civil Service (Medical): No    Lack of Transportation (Non-Medical): No  Physical Activity: Not on file  Stress: Not on file  Social Connections: Unknown (08/09/2024)   Social Connection and Isolation Panel    Frequency of Communication with Friends and Family: Once a week    Frequency of Social Gatherings with Friends and Family: Once a week    Attends Religious Services: Patient declined    Database administrator or Organizations: Patient declined    Attends Banker Meetings: Patient declined    Marital Status: Patient declined  Intimate Partner Violence: Not At Risk (08/09/2024)   Humiliation, Afraid, Rape, and Kick questionnaire    Fear of Current or Ex-Partner: No    Emotionally Abused: No    Physically Abused: No    Sexually Abused: No     Code Status   Code Status: Prior  Review of Systems: All systems reviewed and negative except where noted in HPI.  Physical Exam: Vital signs in last 24 hours: Temp:  [97.3 F (36.3 C)-98.3 F (36.8 C)] 98.3 F (36.8 C) (09/03 0519) Pulse Rate:  [90-108] 94 (09/03 0519) Resp:  [16-18] 16 (09/03 0519) BP: (110-151)/(44-64) 110/44 (09/03 0519) SpO2:  [92 %-100 %] 97 % (09/03 0519) Weight:  [72.1 kg-73 kg] 72.1 kg (09/03 0054) Last BM Date : 08/09/24  General:  Pleasant male in NAD Psych:  Cooperative. Normal mood and affect Eyes: Pupils equal Ears:   Normal auditory acuity Nose: No deformity, discharge or lesions Neck:  Supple, no masses felt Lungs:  Clear to auscultation.  Heart:  Regular rate, irregular rhythm.  Abdomen:  Soft, nondistended, nontender, active bowel sounds, no masses felt Rectal :  Deferred Msk: Symmetrical without gross deformities.  Neurologic:  Alert, oriented, grossly normal neurologically Extremities : No edema Skin:  Intact without significant lesions.    Intake/Output from previous day: No intake/output data recorded. Intake/Output this shift:  No intake/output data recorded.   Vina Dasen, NP-C   08/09/2024, 8:46 AM

## 2024-08-09 NOTE — Assessment & Plan Note (Signed)
 ILD  Followed by [facility] outpatient  s/p SBRT completed on 02/25/2021  Prior imaging with interstitial process in the left lower lobe which has increased in the right lung base-? Fibrotic disease  Pending outpatient pulmonology follow  Stable from a respiratory standpoint at present  Prn inhalers  Follow

## 2024-08-09 NOTE — Assessment & Plan Note (Signed)
 BP stable Titrate home regimen

## 2024-08-09 NOTE — Assessment & Plan Note (Addendum)
 Long history of PAF with RVR S/P multiple DCCVs and failed AF ablation (12/21)  Rate relatively stable at present  Holding coumadin  in setting of GI bleeding  Titrate regimen including amiodarone  and coreg  as po status tolerates

## 2024-08-09 NOTE — Assessment & Plan Note (Addendum)
 Noted SMA occlusion on CT today   Also with Aneurysmal dilatation of the proximal left profunda femoral artery measuring 5.3 x 2.2 x 2.4 cm No active abdominal pain  Preliminarily discussed with Dr. Serene with vascular surgery-not felt to be acute Can follow-up outpatient vascular surgery Otherwise monitor

## 2024-08-09 NOTE — Assessment & Plan Note (Signed)
 Transesophageal echo February 2025 EF 50 to 55% Appears fairly euvolemic Weight today 71.2  kg Getting intermediate IV fluids in the setting of active GI bleeding Follow volume status closely

## 2024-08-09 NOTE — Assessment & Plan Note (Signed)
 Creatinine 1.6 with GFR of 40 Looks to be near baseline Monitor

## 2024-08-09 NOTE — H&P (Addendum)
 History and Physical    Patient: [name] [name] FMW:[id number] DOB: 10-29-1946 DOA: 08/08/2024 DOS: the patient was seen and examined on 08/09/2024 PCP: Onita Rush, MD  Patient coming from: Home  Chief Complaint:  Chief Complaint  Patient presents with   Blood In Stools   HPI: [name] [name] is a 78 y.o. male with medical history significant of CAD s/p CABG, lung cancer status post SBRT, fibrotic lung changes, mechanical AVR on coumadin , PAF, stage III CKD extensive PAD, DM, HTN, HLD, ITP, TIA presenting with GI bleed.  Patient reports having few bowel movement yesterday.  Patient  bowel movements grossly red.  Patient denies any abdominal pain.  Patient reports prior history of GI bleeding associated with colonoscopy.  Currently on Coumadin  in the setting of atrial fibrillation as well as mechanical aortic valve.  Most recent INR was roughly 2.8.  Patient denies any recent NSAID use or strenuous activity.  No abdominal trauma.  Has had roughly 3-4 bloody bowel movements since initial event.  Does report worsening mild generalized weakness.  No focal hemiparesis or confusion.  No reported dizziness.  Noted history of lung cancer status post SBRT with secondary right lung changes.  No wheezing noted. Presented to the ER afebrile, heart rate 100s, BP stable.  Satting well on room air.  White count 8, hemoglobin 16.3, platelets 239, creatinine 1.59.  Hemoccult positive.  CT angio GI bleed showing small amount of acute hemorrhage in the anorectal junction, severe atherosclerotic disease of the SMA with occlusion.  Aneurysmal dilatation of the proximal left profunda measuring 5.3 x 2.2 x 2.4 cm.  Positive ground glass changes in the lung bases. Review of Systems: As mentioned in the history of present illness. All other systems reviewed and are negative. Past Medical History:  Diagnosis Date   Adenomatous colon polyp 09/1997   Anemia    Aortic stenosis    s/p [facility] mechanical AVR -  Chronic Coumadin    Blood transfusion    related to ITP   CHF (congestive heart failure) (HCC)    COPD (chronic obstructive pulmonary disease) (HCC)    patient denies this dx on 01/27/21   Coronary artery disease    s/p cabg x 3 11/2003: lima-lad, seq vg to rpda and rpl   Diverticulitis of colon    Dysrhythmia    a-fib   GERD (gastroesophageal reflux disease)    patient states he has never had reflux   Heart murmur    History of radiation therapy 02/18/2021, 02/20/2021, 02/25/2021   SBRT to left lung     Dr Lynwood Nasuti   Hyperlipidemia    Hypertension    ITP (idiopathic thrombocytopenic purpura)    Lung cancer (HCC)    last week   Peripheral arterial disease (HCC)    a. history of aortobifemoral bypass grafting by Dr. Krystal early b. LE angiography 04/22/2015 patent aortobifem graft, DES to R SFA   Peripheral vascular disease (HCC)    s/p Left external Iliac Artery stenting and subsequent left femoral endarterectomy 02/2011- post op course complicated by wound infxn req I&D 03/2011   Renal artery stenosis, native, bilateral (HCC)    a. bilateral renal artery stenosis by recent duplex ultrasound b. L renal artery stent 02/2015, R renal artery patent on angiogram   Stroke [facility]) 2015   TIA (transient ischemic attack) ~ 2013   Type II diabetes mellitus (HCC)    Past Surgical History:  Procedure Laterality Date   ABDOMINAL AORTAGRAM N/A 12/16/2011  Procedure: ABDOMINAL EZELLA;  Surgeon: Ozell Fell, MD;  Location: [facility] CATH LAB;  Service: Cardiovascular;  Laterality: N/A;   ABDOMINAL AORTOGRAM W/LOWER EXTREMITY Right 02/29/2020   Procedure: ABDOMINAL AORTOGRAM W/LOWER EXTREMITY;  Surgeon: Court Dorn PARAS, MD;  Location: [facility] INVASIVE CV LAB;  Service: Cardiovascular;  Laterality: Right;   ANGIOPLASTY / STENTING ILIAC     Left external Iliac Artery   AORTA - BILATERAL FEMORAL ARTERY BYPASS GRAFT  01/18/2012   Procedure: AORTA BIFEMORAL BYPASS GRAFT;  Surgeon: Krystal Doing, MD;  Location: [facility]  OR;  Service: Vascular;  Laterality: N/A;   AORTIC VALVE REPLACEMENT  ~ 2004   ATRIAL FIBRILLATION ABLATION N/A 11/20/2020   Procedure: ATRIAL FIBRILLATION ABLATION;  Surgeon: Inocencio Soyla Lunger, MD;  Location: [facility] INVASIVE CV LAB;  Service: Cardiovascular;  Laterality: N/A;   BRONCHIAL BIOPSY  01/28/2021   Procedure: BRONCHIAL BIOPSIES;  Surgeon: Brenna Adine CROME, DO;  Location: [facility] ENDOSCOPY;  Service: Pulmonary;;   BRONCHIAL BRUSHINGS  01/28/2021   Procedure: BRONCHIAL BRUSHINGS;  Surgeon: Brenna Adine CROME, DO;  Location: [facility] ENDOSCOPY;  Service: Pulmonary;;   BRONCHIAL NEEDLE ASPIRATION BIOPSY  01/28/2021   Procedure: BRONCHIAL NEEDLE ASPIRATION BIOPSIES;  Surgeon: Brenna Adine CROME, DO;  Location: [facility] ENDOSCOPY;  Service: Pulmonary;;   BRONCHIAL WASHINGS  01/28/2021   Procedure: BRONCHIAL WASHINGS;  Surgeon: Brenna Adine CROME, DO;  Location: [facility] ENDOSCOPY;  Service: Pulmonary;;   BRONCHIAL WASHINGS  03/19/2023   Procedure: BRONCHIAL WASHINGS;  Surgeon: Claudene Toribio BROCKS, MD;  Location: [facility] ENDOSCOPY;  Service: Pulmonary;;   CARDIAC CATHETERIZATION  11/2003   /pt report 10/01/2016   CARDIAC VALVE REPLACEMENT  11/2003   aortic   CARDIOVERSION N/A 10/16/2019   Procedure: CARDIOVERSION;  Surgeon: Rolan Ezra RAMAN, MD;  Location: [facility] ENDOSCOPY;  Service: Cardiovascular;  Laterality: N/A;   CARDIOVERSION N/A 01/01/2021   Procedure: CARDIOVERSION;  Surgeon: Rolan Ezra RAMAN, MD;  Location: [facility] ENDOSCOPY;  Service: Cardiovascular;  Laterality: N/A;   CARDIOVERSION N/A 01/18/2024   Procedure: CARDIOVERSION;  Surgeon: Rolan Ezra RAMAN, MD;  Location: [facility] INVASIVE CV LAB;  Service: Cardiovascular;  Laterality: N/A;   CARDIOVERSION N/A 02/21/2024   Procedure: CARDIOVERSION;  Surgeon: Rolan Ezra RAMAN, MD;  Location: [facility] INVASIVE CV LAB;  Service: Cardiovascular;  Laterality: N/A;   CATARACT EXTRACTION W/ INTRAOCULAR LENS  IMPLANT, BILATERAL Bilateral    COLONOSCOPY     COLONOSCOPY WITH PROPOFOL  N/A 09/03/2019    Procedure: COLONOSCOPY WITH PROPOFOL ;  Surgeon: Legrand Victory CROME DOUGLAS, MD;  Location: [facility] ENDOSCOPY;  Service: Gastroenterology;  Laterality: N/A;   CORONARY ARTERY BYPASS GRAFT  11/2003   thelbert 04/21/2011   CORONARY ATHERECTOMY N/A 10/10/2019   Procedure: CORONARY ATHERECTOMY;  Surgeon: Fell Ozell, MD;  Location: [facility] INVASIVE CV LAB;  Service: Cardiovascular;  Laterality: N/A;   CORONARY STENT INTERVENTION N/A 10/10/2019   Procedure: CORONARY STENT INTERVENTION;  Surgeon: Fell Ozell, MD;  Location: [facility] INVASIVE CV LAB;  Service: Cardiovascular;  Laterality: N/A;   CORONARY/GRAFT ANGIOGRAPHY N/A 09/18/2019   Procedure: CORONARY/GRAFT ANGIOGRAPHY;  Surgeon: Court Dorn PARAS, MD;  Location: [facility] INVASIVE CV LAB;  Service: Cardiovascular;  Laterality: N/A;   ESOPHAGOGASTRODUODENOSCOPY (EGD) WITH PROPOFOL  N/A 02/27/2019   Procedure: ESOPHAGOGASTRODUODENOSCOPY (EGD) WITH PROPOFOL ;  Surgeon: Legrand Victory CROME DOUGLAS, MD;  Location: [facility] ENDOSCOPY;  Service: Endoscopy;  Laterality: N/A;   FIDUCIAL MARKER PLACEMENT  01/28/2021   Procedure: FIDUCIAL MARKER PLACEMENT;  Surgeon: Brenna Adine CROME, DO;  Location: [facility] ENDOSCOPY;  Service: Pulmonary;;   HEMOSTASIS CLIP PLACEMENT  09/03/2019  Procedure: HEMOSTASIS CLIP PLACEMENT;  Surgeon: Legrand Victory LITTIE DOUGLAS, MD;  Location: [facility]OS[facility];  Service: Gastroenterology;;   LOWER EXTREMITY ANGIOGRAM N/A 02/21/2015   Procedure: LOWER EXTREMITY ANGIOGRAM;  Surgeon: Dorn JINNY Lesches, MD;  Location: [facility] CATH LAB;  Service: Cardiovascular;  Laterality: N/A;   LOWER EXTREMITY ANGIOGRAPHY  03/24/2024   Procedure: Lower Extremity Angiography;  Surgeon: Ladona Heinz, MD;  Location: [facility] INVASIVE CV LAB;  Service: Cardiovascular;;   LOWER EXTREMITY INTERVENTION  03/24/2024   Procedure: LOWER EXTREMITY INTERVENTION;  Surgeon: Ladona Heinz, MD;  Location: [facility] INVASIVE CV LAB;  Service: Cardiovascular;;   PERIPHERAL VASCULAR CATHETERIZATION N/A 04/22/2015   Procedure: Lower Extremity Angiography;   Surgeon: Dorn JINNY Lesches, MD;  Location: [facility] INVASIVE CV LAB;  Service: Cardiovascular;  Laterality: N/A;   PERIPHERAL VASCULAR CATHETERIZATION N/A 08/24/2016   Procedure: Lower Extremity Angiography;  Surgeon: Dorn JINNY Lesches, MD;  Location: [facility] INVASIVE CV LAB;  Service: Cardiovascular;  Laterality: N/A;   PERIPHERAL VASCULAR CATHETERIZATION Right 10/01/2016   Procedure: Peripheral Vascular Intervention - STENT;  Surgeon: Dorn JINNY Lesches, MD;  Location: [facility] INVASIVE CV LAB;  Service: Cardiovascular;  Laterality: Right;  Prox and MID SFA    PERIPHERAL VASCULAR INTERVENTION  02/29/2020   Procedure: PERIPHERAL VASCULAR INTERVENTION;  Surgeon: Lesches Dorn JINNY, MD;  Location: [facility] INVASIVE CV LAB;  Service: Cardiovascular;;  Right SFA   POLYPECTOMY     RENAL ANGIOGRAM N/A 02/21/2015   Procedure: RENAL ANGIOGRAM;  Surgeon: Dorn JINNY Lesches, MD;  Location: [facility] CATH LAB;  Service: Cardiovascular;  Laterality: Bilateral; 6 mm x 12 mm long Herculink balloon expandable stent to the left renal artery   RENAL ARTERY STENT Left 04/22/2015   dr berry   SPLENECTOMY  02/2003   thelbert 04/21/2011   TEE WITHOUT CARDIOVERSION N/A 10/16/2019   Procedure: TRANSESOPHAGEAL ECHOCARDIOGRAM (TEE);  Surgeon: Rolan Ezra RAMAN, MD;  Location: [facility] ENDOSCOPY;  Service: Cardiovascular;  Laterality: N/A;   TEMPORARY PACEMAKER N/A 10/10/2019   Procedure: TEMPORARY PACEMAKER;  Surgeon: Wonda Sharper, MD;  Location: [facility] INVASIVE CV LAB;  Service: Cardiovascular;  Laterality: N/A;   TONSILLECTOMY  ~ 1952   TRANSESOPHAGEAL ECHOCARDIOGRAM (CATH LAB) N/A 01/18/2024   Procedure: TRANSESOPHAGEAL ECHOCARDIOGRAM;  Surgeon: Rolan Ezra RAMAN, MD;  Location: [facility] INVASIVE CV LAB;  Service: Cardiovascular;  Laterality: N/A;   VIDEO BRONCHOSCOPY Left 03/19/2023   Procedure: VIDEO BRONCHOSCOPY WITHOUT FLUORO;  Surgeon: Claudene Toribio BROCKS, MD;  Location: [facility] ENDOSCOPY;  Service: Pulmonary;  Laterality: Left;   VIDEO BRONCHOSCOPY WITH ENDOBRONCHIAL NAVIGATION  N/A 01/28/2021   Procedure: VIDEO BRONCHOSCOPY WITH ENDOBRONCHIAL NAVIGATION;  Surgeon: Brenna Adine LITTIE, DO;  Location: [facility] ENDOSCOPY;  Service: Pulmonary;  Laterality: N/A;   Social History:  reports that he quit smoking about 30 years ago. His smoking use included cigarettes and cigars. He started smoking about 60 years ago. He has a 45 pack-year smoking history. He has never used smokeless tobacco. He reports current alcohol  use of about 15.0 standard drinks of alcohol  per week. He reports that he does not use drugs.  No Known Allergies  Family History  Problem Relation Age of Onset   Coronary artery disease Mother        bypass surgery - deceased   Heart disease Father        murmur, valve replacement - deceased   Breast cancer Sister    Diabetes Other        grandmother   Diabetes Paternal Grandmother    Diabetes Paternal Aunt  Colon cancer Neg Hx    Colon polyps Neg Hx    Esophageal cancer Neg Hx    Rectal cancer Neg Hx    Stomach cancer Neg Hx     Prior to Admission medications   Medication Sig Start Date End Date Taking? Authorizing Provider  fluorouracil (EFUDEX) 5 % cream SMARTSIG:sparingly Topical Twice Daily 07/12/24  Yes [provider]  amiodarone  (PACERONE ) 200 MG tablet Take 200 mg by mouth in the morning.    [provider]  aspirin  EC 81 MG tablet Take 1 tablet (81 mg total) by mouth daily. 04/27/24   Ladona Heinz, MD  atorvastatin  (LIPITOR ) 80 MG tablet Take 80 mg by mouth in the morning.    [provider]  Blood Glucose Monitoring Suppl (ONETOUCH VERIO IQ SYSTEM) w/Device KIT  11/23/19   [provider]  carvedilol  (COREG ) 12.5 MG tablet Take 1 tablet (12.5 mg total) by mouth 2 (two) times daily. 04/27/24   Ladona Heinz, MD  enoxaparin  (LOVENOX ) 120 MG/0.8ML injection Inject 0.8 mLs (120 mg total) into the skin daily. Start tomorrow 03/25/24 Patient taking differently: Inject 120 mg into the skin daily. Start tomorrow 03/25/24 as  needed 03/25/24   Ladona Heinz, MD  Evolocumab 140 MG/ML SOAJ Inject 140 mg into the skin every 14 (fourteen) days.    [provider]  furosemide  (LASIX ) 20 MG tablet Take 2 tablets (40 mg total) by mouth daily. May take extra 20 mg as needed for wt gain, swelling, or increase shortness of breath. 03/02/24   Lee, [place], NP  glucose blood (ONETOUCH VERIO) test strip  02/04/15   [provider]  glyBURIDE  (DIABETA ) 2.5 MG tablet Take 5 mg by mouth 2 (two) times daily with a meal.    [provider]  JARDIANCE  25 MG TABS tablet Take 25 mg by mouth in the morning. 03/17/23   [provider]  nitroGLYCERIN  (NITROSTAT ) 0.4 MG SL tablet Place 1 tablet (0.4 mg total) under the tongue every 5 (five) minutes x 3 doses as needed for chest pain. 09/19/19   Windle Fret, NP  OneTouch Delica Lancets 33G MISC  11/23/19   [provider]  sacubitril -valsartan  (ENTRESTO ) 24-26 MG TAKE 1 TABLET BY MOUTH TWICE A DAY 04/04/24   Lee, [place], NP  spironolactone  (ALDACTONE ) 25 MG tablet TAKE 1 TABLET BY MOUTH EVERY DAY 04/19/24   Rolan Ezra RAMAN, MD  warfarin (COUMADIN ) 5 MG tablet Take 1/2 tablet to 1 tablet by mouth daily as prescribed by the coumadin  clinic. 06/16/24   Ladona Heinz, MD    Physical Exam: Vitals:   08/09/24 0054 08/09/24 0104 08/09/24 0519 08/09/24 0910  BP:  118/64 (!) 110/44 (!) 139/45  Pulse:  (!) 104 94 96  Resp:  16 16 20   Temp:  (!) 97.3 F (36.3 C) 98.3 F (36.8 C) 97.7 F (36.5 C)  TempSrc:  Oral Oral Axillary  SpO2:  92% 97% 90%  Weight: 72.1 kg     Height: 5' 8 (1.727 m)      Physical Exam Constitutional:      Appearance: He is normal weight.  HENT:     Head: Normocephalic and atraumatic.     Nose: Nose normal.     Mouth/Throat:     Mouth: Mucous membranes are moist.  Eyes:     Pupils: Pupils are equal, round, and reactive to light.  Cardiovascular:     Rate and Rhythm: Normal rate and regular rhythm.  Pulmonary:  Effort:  Pulmonary effort is normal.  Abdominal:     General: Bowel sounds are normal.  Musculoskeletal:        General: Normal range of motion.  Skin:    General: Skin is warm.  Neurological:     General: No focal deficit present.  Psychiatric:        Mood and Affect: Mood normal.     Data Reviewed:  There are no new results to review at this time.  CT ANGIO GI BLEED CLINICAL DATA:  Lower GI bleeding. Rectal bleeding. History of non-small cell lung cancer.  EXAM: CTA ABDOMEN AND PELVIS WITHOUT AND WITH CONTRAST  TECHNIQUE: Multidetector CT imaging of the abdomen and pelvis was performed using the standard protocol during bolus administration of intravenous contrast. Multiplanar reconstructed images and MIPs were obtained and reviewed to evaluate the vascular anatomy.  RADIATION DOSE REDUCTION: This exam was performed according to the departmental dose-optimization program which includes automated exposure control, adjustment of the mA and/or kV according to patient size and/or use of iterative reconstruction technique.  CONTRAST:  80mL OMNIPAQUE  IOHEXOL  350 MG/ML SOLN  COMPARISON:  CT chest 07/26/2024. PET-CT 12/13/2020.  FINDINGS: VASCULAR  Aorta: No evidence for aneurysm or dissection. Aorto bi-iliac bypass graft is present with anastomoses in the bilateral common femoral arteries. The aortic portion of the graft is patent. There is no significant stenosis. Aorta is heavily calcified.  Celiac: Patent without evidence of aneurysm, dissection, vasculitis or significant stenosis. Calcified atherosclerotic disease present.  SMA: Severe atherosclerotic disease present. There is a occlusion of the proximal 2.5 cm of the superior mesenteric artery. The mid and distal artery appear patent.  Renals: Severe atherosclerotic disease present. There severe stenosis of the origin of the bilateral renal arteries. No evidence for dissection or aneurysm.  IMA: Not  visualized.  Inflow: The bilateral iliac grafts are widely patent. The native bilateral external and internal iliac arteries are occluded.  Proximal Outflow: Right common femoral and proximal profunda femoral artery appear within normal limits and patent. Profunda femoral artery graft present. There is aneurysmal dilatation of the proximal aspect of the left profunda femoral artery measuring 5.3 cm in length and 2.2 by 2.4 cm in largest AP and transverse dimensions. There some surrounding thickening of the arterial wall at this level. The common femoral artery and superficial femoral artery appear patent and within normal limits.  Veins: No obvious venous abnormality within the limitations of this arterial phase study.  Review of the MIP images confirms the above findings.  NON-VASCULAR  Lower chest: There smooth interlobular septal thickening and ground-glass opacities in the lung bases with minimal patchy airspace opacities in the right lung base. Findings are unchanged from 07/26/2024.  Hepatobiliary: No focal liver abnormality is seen. No gallstones, gallbladder wall thickening, or biliary dilatation.  Pancreas: Unremarkable. No pancreatic ductal dilatation or surrounding inflammatory changes.  Spleen: Spleen is not visualized.  Adrenals/Urinary Tract: Adrenal glands are unremarkable. Kidneys are normal, without renal calculi, focal lesion, or hydronephrosis. Bladder is unremarkable.  Stomach/Bowel: There is a small amount of acute hemorrhage of the anorectal junction on the right. No other areas of hemorrhage are identified. There is no bowel obstruction, wall thickening, pneumatosis or free air. The appendix is within normal limits. There is diffuse colonic diverticulosis. The stomach is normal.  Lymphatic: No enlarged lymph nodes are identified.  Reproductive: Prostate is mildly enlarged.  Other: No abdominal wall hernia or abnormality. No  abdominopelvic ascites.  Musculoskeletal: No acute or significant osseous  findings.  IMPRESSION: 1. Small amount of acute hemorrhage at the anorectal junction on the right. 2. Aorto bi-iliac bypass graft is patent. 3. 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 The mid and distal artery appear patent. 4. Severe stenosis of the origin of the bilateral renal arteries. 5. Aneurysmal dilatation of the proximal left profunda femoral artery measuring 5.3 x 2.2 x 2.4 cm. There is some surrounding thickening of the arterial wall at this level. 6. Colonic diverticulosis. 7. Stable interlobular septal thickening and ground-glass opacities in the lung bases with minimal patchy airspace opacities in the right lung base. Findings may be related to pulmonary edema or infection.  Aortic Atherosclerosis (ICD10-I70.0).  Electronically Signed   By: Greig Pique M.D.   On: 08/08/2024 23:13  Lab Results  Component Value Date   WBC 7.5 08/09/2024   HGB 13.2 08/09/2024   HCT 40.2 08/09/2024   MCV 95.0 08/09/2024   PLT 214 08/09/2024   Last metabolic panel Lab Results  Component Value Date   GLUCOSE 156 (H) 08/09/2024   NA 141 08/09/2024   K 4.8 08/09/2024   CL 104 08/09/2024   CO2 24 08/09/2024   BUN 48 (H) 08/09/2024   CREATININE 1.67 (H) 08/09/2024   GFRNONAA 42 (L) 08/09/2024   CALCIUM  9.1 08/09/2024   PROT 8.7 (H) 08/08/2024   ALBUMIN  4.6 08/08/2024   BILITOT 0.7 08/08/2024   ALKPHOS 92 08/08/2024   AST 25 08/08/2024   ALT 18 08/08/2024   ANIONGAP 13 08/09/2024    Assessment and Plan: GI bleed Multiple episodes of bright red blood per rectum over the past 24 hours Noted Coumadin  and ASA  use in the setting of mechanical aortic valve and CAD Hemoglobin 16-->13 overnight Will trend hemoglobin Hold offending medications Pending formal GI consultation IV fluid  resuscitation Transfuse for hemoglobin less than 7 or 8.5 if symptomatic Follow closely  Occlusion of superior mesenteric artery (HCC) Noted SMA occlusion on CT today   Also with Aneurysmal dilatation of the proximal left profunda femoral artery measuring 5.3 x 2.2 x 2.4 cm No active abdominal pain  Preliminarily discussed with Dr. Serene with vascular surgery-not felt to be acute Can follow-up outpatient vascular surgery Otherwise monitor    (HFpEF) heart failure with preserved ejection fraction (HCC) Transesophageal echo February 2025 EF 50 to 55% Appears fairly euvolemic Weight today 71.2  kg Getting intermediate IV fluids in the setting of active GI bleeding Follow volume status closely   Type 2 diabetes mellitus with diabetic peripheral angiopathy without gangrene (HCC) Blood sugar in the 150s SSI A1c Monitor  Presence of prosthetic heart valve Patient mechanical aortic valve on Coumadin  Will hold Coumadin  in the setting of active GI bleeding Monitor Consult cardiology as appropriate  Chronic kidney disease, stage 3a (HCC) Creatinine 1.6 with GFR of 40 Looks to be near baseline Monitor    Squamous cell carcinoma of left lung (HCC) ILD  Followed by [facility] outpatient  s/p SBRT completed on 02/25/2021  Prior imaging with interstitial process in the left lower lobe which has increased in the right lung base-? Fibrotic disease  Pending outpatient pulmonology follow  Stable from a respiratory standpoint at present  Prn inhalers  Follow     Paroxysmal atrial fibrillation (HCC) Long history of PAF with RVR S/P multiple DCCVs and failed AF ablation (12/21)  Rate relatively stable at present  Holding coumadin  in setting  of GI bleeding  Titrate regimen including amiodarone  and coreg  as po status tolerates    Hypertension BP stable Titrate home regimen  Peripheral vascular disease (HCC) Noted baseline extensive history of peripheral  vascular disease with Aortobifemoral bypass in 2016 with follow up iliac stenting and femoral endarterectomy. S/p ostial and mid right SFA intervention 2017   Coronary artery disease [place] hx/o CAD s/p CABG 11/2003  No active CP  Holding ASA in setting of GI bleeding  Monitor        Advance Care Planning:   Code Status: Full Code   Consults: GI   Family Communication: No family at the bedside   Severity of Illness: The appropriate patient status for this patient is OBSERVATION. Observation status is judged to be reasonable and necessary in order to provide the required intensity of service to ensure the patient's safety. The patient's presenting symptoms, physical exam findings, and initial radiographic and laboratory data in the context of their medical condition is felt to place them at decreased risk for further clinical deterioration. Furthermore, it is anticipated that the patient will be medically stable for discharge from the hospital within 2 midnights of admission.   Author: Elspeth JINNY Masters, MD 08/09/2024 10:42 AM  For on call review [email or web address].

## 2024-08-09 NOTE — Consult Note (Signed)
 [facility] [place]  Patient name: [name] [name] MRN: [id number] DOB: [birth year] Sex: male   REQUESTING PROVIDER:    Hospitalists   REASON FOR CONSULT:    PFA aneurysm, SMA occlusion  HISTORY OF PRESENT ILLNESS:     The patient has undergone the following procedures  02/16/2003: Laparoscopic splenectomy 11/07/2003:: CABG 07/10/2005: Right common iliac stent 05/17/2007: Left external iliac stent 03/04/2011: Left iliofemoral endarterectomy with dacryon patch 03/13/2011: I&D left groin for lymphocele 03/19/2011: I&D left groin 12/16/2011: Right iliac orbital atherectomy and stenting.  Left external iliac angioplasty 01/18/2012 aortobifemoral bypass graft (14 x 8 PAST MEDICAL HISTORY    Past Medical History:  Diagnosis Date   Adenomatous colon polyp 09/1997   Anemia    Aortic stenosis    s/p [facility] mechanical AVR - Chronic Coumadin    Blood transfusion    related to ITP   CHF (congestive heart failure) (HCC)    COPD (chronic obstructive pulmonary disease) (HCC)    patient denies this dx on 01/27/21   Coronary artery disease    s/p cabg x 3 11/2003: lima-lad, seq vg to rpda and rpl   Diverticulitis of colon    Dysrhythmia    a-fib   GERD (gastroesophageal reflux disease)    patient states he has never had reflux   Heart murmur    History of radiation therapy 02/18/2021, 02/20/2021, 02/25/2021   SBRT to left lung     Dr Lynwood Nasuti   Hyperlipidemia    Hypertension    ITP (idiopathic thrombocytopenic purpura)    Lung cancer (HCC)    last week   Peripheral arterial disease (HCC)    a. history of aortobifemoral bypass grafting by Dr. Krystal early b. LE angiography 04/22/2015 patent aortobifem graft, DES to R SFA   Peripheral vascular disease (HCC)    s/p Left external Iliac Artery stenting and subsequent left femoral endarterectomy 02/2011- post op course complicated by wound infxn req I&D 03/2011   Renal artery  stenosis, native, bilateral (HCC)    a. bilateral renal artery stenosis by recent duplex ultrasound b. L renal artery stent 02/2015, R renal artery patent on angiogram   Stroke (HCC) 2015   TIA (transient ischemic attack) ~ 2013   Type II diabetes mellitus (HCC)      FAMILY HISTORY   Family History  Problem Relation Age of Onset   Coronary artery disease Mother        bypass surgery - deceased   Heart disease Father        murmur, valve replacement - deceased   Breast cancer Sister    Diabetes Other        grandmother   Diabetes Paternal Grandmother    Diabetes Paternal Aunt    Colon cancer Neg Hx    Colon polyps Neg Hx    Esophageal cancer Neg Hx    Rectal cancer Neg Hx    Stomach cancer Neg Hx     SOCIAL HISTORY:   Social History   Socioeconomic History   Marital status: Divorced    Spouse name: Not on file   Number of children: 3   Years of education: Not on file   Highest education level: Not on file  Occupational History   Occupation: Retired  Tobacco Use   Smoking status: Former    Current packs/day: 0.00    Average packs/day: 1.5 packs/day for 30.0 years (45.0 ttl pk-yrs)    Types: Cigarettes, Cigars  Start date: 12/16/1963    Quit date: 12/15/1993    Years since quitting: 30.6   Smokeless tobacco: Never   Tobacco comments:    occasional cigar  Vaping Use   Vaping status: Never Used  Substance and Sexual Activity   Alcohol  use: Yes    Alcohol /week: 15.0 standard drinks of alcohol     Types: 1 Cans of beer, 7 Shots of liquor, 7 Standard drinks or equivalent per week    Comment: drinks 1 martini's a night (2 shots)   Drug use: No   Sexual activity: Yes  Other Topics Concern   Not on file  Social History Narrative   Tries to remain active.  Frequent golfer but claudication limits this.   1 child has passed   Social Drivers of Corporate investment banker Strain: Not on file  Food Insecurity: No Food Insecurity (08/09/2024)   Hunger Vital Sign     Worried About Running Out of Food in the Last Year: Never true    Ran Out of Food in the Last Year: Never true  Transportation Needs: No Transportation Needs (08/09/2024)   PRAPARE - Administrator, Civil Service (Medical): No    Lack of Transportation (Non-Medical): No  Physical Activity: Not on file  Stress: Not on file  Social Connections: Unknown (08/09/2024)   Social Connection and Isolation Panel    Frequency of Communication with Friends and Family: Once a week    Frequency of Social Gatherings with Friends and Family: Once a week    Attends Religious Services: Patient declined    Database administrator or Organizations: Patient declined    Attends Banker Meetings: Patient declined    Marital Status: Patient declined  Intimate Partner Violence: Not At Risk (08/09/2024)   Humiliation, Afraid, Rape, and Kick questionnaire    Fear of Current or Ex-Partner: No    Emotionally Abused: No    Physically Abused: No    Sexually Abused: No    ALLERGIES:    No Known Allergies  CURRENT MEDICATIONS:    Current Facility-Administered Medications  Medication Dose Route Frequency Provider Last Rate Last Admin   0.9 %  sodium chloride  infusion   Intravenous Continuous Eldonna Elspeth PARAS, MD       acetaminophen  (TYLENOL ) tablet 650 mg  650 mg Oral Q6H PRN Eldonna Elspeth PARAS, MD       Or   acetaminophen  (TYLENOL ) suppository 650 mg  650 mg Rectal Q6H PRN Eldonna Elspeth PARAS, MD       ondansetron  (ZOFRAN ) tablet 4 mg  4 mg Oral Q6H PRN Eldonna Elspeth PARAS, MD       Or   ondansetron  (ZOFRAN ) injection 4 mg  4 mg Intravenous Q6H PRN Eldonna Elspeth PARAS, MD       CT:  1. Small amount of acute hemorrhage at the anorectal junction on the right. 2. Aorto bi-iliac bypass graft is patent. 3. 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 The mid and distal artery appear patent. 4. Severe  stenosis of the origin of the bilateral renal arteries. 5. Aneurysmal dilatation of the proximal left profunda femoral artery measuring 5.3 x 2.2 x 2.4 cm. There is some surrounding thickening of the arterial wall at this level. 6. Colonic diverticulosis. 7. Stable interlobular septal thickening and ground-glass opacities in the lung bases with minimal patchy airspace opacities in the right lung base. Findings  may be related to pulmonary edema or infection.   Aortic Atherosclerosis (ICD10-I70.0). ASSESSMENT and PLAN   Will schedule patient for office follow up I 1 month for eval of SMA disease and femoral pseudoaneurysm which is 2.4cm in diameter and 5.3 in length   Malvina New, IV, MD, FACS [facility] [phone] Pager [phone]

## 2024-08-09 NOTE — Assessment & Plan Note (Addendum)
 Multiple episodes of bright red blood per rectum over the past 24 hours Noted Coumadin  and ASA  use in the setting of mechanical aortic valve and CAD Hemoglobin 16-->13 overnight Will trend hemoglobin Hold offending medications Pending formal GI consultation IV fluid resuscitation Transfuse for hemoglobin less than 7 or 8.5 if symptomatic Follow closely

## 2024-08-10 ENCOUNTER — Encounter (HOSPITAL_COMMUNITY): Payer: Self-pay

## 2024-08-10 ENCOUNTER — Telehealth: Payer: Self-pay | Admitting: Surgery

## 2024-08-10 DIAGNOSIS — Z860101 Personal history of adenomatous and serrated colon polyps: Secondary | ICD-10-CM | POA: Diagnosis not present

## 2024-08-10 DIAGNOSIS — I10 Essential (primary) hypertension: Secondary | ICD-10-CM

## 2024-08-10 DIAGNOSIS — I5032 Chronic diastolic (congestive) heart failure: Secondary | ICD-10-CM

## 2024-08-10 DIAGNOSIS — C3492 Malignant neoplasm of unspecified part of left bronchus or lung: Secondary | ICD-10-CM

## 2024-08-10 DIAGNOSIS — E1151 Type 2 diabetes mellitus with diabetic peripheral angiopathy without gangrene: Secondary | ICD-10-CM

## 2024-08-10 DIAGNOSIS — K922 Gastrointestinal hemorrhage, unspecified: Secondary | ICD-10-CM | POA: Diagnosis not present

## 2024-08-10 LAB — HEMOGLOBIN AND HEMATOCRIT, BLOOD
HCT: 34.9 % — ABNORMAL LOW (ref 39.0–52.0)
HCT: 34.9 % — ABNORMAL LOW (ref 39.0–52.0)
HCT: 35.4 % — ABNORMAL LOW (ref 39.0–52.0)
HCT: 35.5 % — ABNORMAL LOW (ref 39.0–52.0)
Hemoglobin: 11.4 g/dL — ABNORMAL LOW (ref 13.0–17.0)
Hemoglobin: 11.4 g/dL — ABNORMAL LOW (ref 13.0–17.0)
Hemoglobin: 11.5 g/dL — ABNORMAL LOW (ref 13.0–17.0)
Hemoglobin: 11.5 g/dL — ABNORMAL LOW (ref 13.0–17.0)

## 2024-08-10 LAB — GLUCOSE, CAPILLARY
Glucose-Capillary: 278 mg/dL — ABNORMAL HIGH (ref 70–99)
Glucose-Capillary: 231 mg/dL — ABNORMAL HIGH (ref 70–99)

## 2024-08-10 LAB — HEMOGLOBIN A1C
Hgb A1c MFr Bld: 8.6 % — ABNORMAL HIGH (ref 4.8–5.6)
Mean Plasma Glucose: 200.12 mg/dL

## 2024-08-10 MED ORDER — INSULIN ASPART 100 UNIT/ML IJ SOLN
0.0000 [IU] | Freq: Three times a day (TID) | INTRAMUSCULAR | Status: DC
  Administered 2024-08-10: 5 [IU] via SUBCUTANEOUS
  Administered 2024-08-11: 3 [IU] via SUBCUTANEOUS
  Administered 2024-08-11: 2 [IU] via SUBCUTANEOUS
  Administered 2024-08-11: 5 [IU] via SUBCUTANEOUS

## 2024-08-10 NOTE — Progress Notes (Signed)
 [facility]                                                                               Raymundo Rout, is a 78 y.o. male, DOB - [birth year], FMW:[id number] Admit date - 08/08/2024    Outpatient Primary MD for the patient is Onita Rush, MD  LOS - 0  days    Brief summary   [name] [name] is a 78 y.o. male with medical history significant of CAD s/p CABG, lung cancer status post SBRT, fibrotic lung changes, mechanical AVR on coumadin , PAF, stage III CKD extensive PAD, DM, HTN, HLD, ITP, TIA presenting with GI bleed.  GI consulted.  CT angio GI bleed showing small amount of acute hemorrhage in the anorectal junction, severe atherosclerotic disease of the SMA with occlusion. Aneurysmal dilatation of the proximal left profunda measuring 5.3 x 2.2 x 2.4 cm. Positive ground glass changes in the lung bases.   Assessment & Plan    Assessment and Plan: GI bleed Multiple episodes of bright red blood per rectum over the past 24 hours Noted Coumadin  and ASA  use in the setting of mechanical aortic valve and CAD Hemoglobin 16 dropped to 11 today.  No BM since yesterday afternoon.  Advance diet as tolerated.  Holding coumadin  for now.  GI on board and following.    Occlusion of superior mesenteric artery (HCC) Noted SMA occlusion on CT today   Also with Aneurysmal dilatation of the proximal left profunda femoral artery measuring 5.3 x 2.2 x 2.4 cm No active abdominal pain  Vascular surgery consulted, recommended outpatient follow up .    (HFpEF) heart failure with preserved ejection fraction (HCC) Transesophageal echo February 2025 EF 50 to 55% Euvolemic.  No chest pain or sob.   Type 2 diabetes mellitus with diabetic peripheral angiopathy without gangrene (HCC) CBG (last 3)  No results for input(s): GLUCAP in the last 72 hours. Resume SSI  Presence of prosthetic heart valve Patient has mechanical aortic valve on Coumadin  On coumadin , which is on hold for  now.   Chronic kidney disease, stage 3a (HCC) Creatinine 1.6 with GFR of 40 Looks to be near baseline Monitor    Squamous cell carcinoma of left lung (HCC) ILD  Followed by [facility] outpatient  s/p SBRT completed on 02/25/2021  Prior imaging with interstitial process in the left lower lobe which has increased in the right lung base-? Fibrotic disease  Recommend outpatient follow upwith pulmonology.     Paroxysmal atrial fibrillation (HCC) Long history of PAF with RVR S/P multiple DCCVs and failed AF ablation (12/21)  Rate relatively stable at present  Holding coumadin  in setting of GI bleeding  Rate controlled on amiodarone  and coreg .    Hypertension Well controlled on coreg .   Peripheral vascular disease (HCC) Noted baseline extensive history of peripheral vascular disease with Aortobifemoral bypass in 2016 with follow up iliac stenting and femoral endarterectomy. S/p ostial and mid right SFA intervention 2017   Coronary artery disease Ba[place] hx/o CAD s/p CABG 11/2003  Holding ASA in setting of GI bleeding  No chest pain or sob.     Estimated body mass index  is 24.16 kg/m as calculated from the following:   Height as of this encounter: 5' 8 (1.727 m).   Weight as of this encounter: 72.1 kg.  Code Status: full code.  DVT Prophylaxis:  Place TED hose Start: 08/09/24 0955   Level of Care: Level of care: Progressive Family Communication: pending clinical improvement.   Disposition Plan:     Remains inpatient appropriate:  gi bleed   Procedures:  CT ANGIO  Consultants:   Vascular surgery Gastroenterology.   Antimicrobials:   Anti-infectives (From admission, onward)    None        Medications  Scheduled Meds:  amiodarone   200 mg Oral q AM   carvedilol   12.5 mg Oral BID   Continuous Infusions: PRN Meds:.acetaminophen  **OR** acetaminophen , ondansetron  **OR** ondansetron  (ZOFRAN ) IV    Subjective:   [name] [name] was  seen and examined today.  No bloody bowel movements since yesterday afternoon.  No nausea, vomiting or abdominal pain.   Objective:   Vitals:   08/10/24 0030 08/10/24 0500 08/10/24 0802 08/10/24 1100  BP: (!) 105/31 (!) 114/42 (!) 142/68 (!) 115/42  Pulse:  79 79 69  Resp:    16  Temp: 98.3 F (36.8 C) 98.1 F (36.7 C) 98 F (36.7 C) 97.9 F (36.6 C)  TempSrc: Oral Oral Oral Oral  SpO2: 98% 99% 92%   Weight:      Height:        Intake/Output Summary (Last 24 hours) at 08/10/2024 1310 Last data filed at 08/10/2024 0805 Gross per 24 hour  Intake --  Output 475 ml  Net -475 ml   Filed Weights   08/08/24 1835 08/09/24 0054  Weight: 73 kg 72.1 kg     Exam General exam: Appears calm and comfortable  Respiratory system: Clear to auscultation. Respiratory effort normal. Cardiovascular system: S1 & S2 heard, RRR. Gastrointestinal system: Abdomen is nondistended, soft and nontender.  Central nervous system: Alert and oriented. Extremities: Symmetric 5 x 5 power. Skin: No rashes,  Psychiatry:  Mood & affect appropriate.     Data Reviewed:  I have personally reviewed following labs and imaging studies   CBC Lab Results  Component Value Date   WBC 7.5 08/09/2024   RBC 4.23 08/09/2024   HGB 11.4 (L) 08/10/2024   HCT 34.9 (L) 08/10/2024   MCV 95.0 08/09/2024   MCH 31.2 08/09/2024   PLT 214 08/09/2024   MCHC 32.8 08/09/2024   RDW 13.8 08/09/2024   LYMPHSABS 2.3 03/24/2023   MONOABS 1.2 (H) 03/24/2023   EOSABS 0.1 03/24/2023   BASOSABS 0.1 03/24/2023     Last metabolic panel Lab Results  Component Value Date   NA 141 08/09/2024   K 4.8 08/09/2024   CL 104 08/09/2024   CO2 24 08/09/2024   BUN 48 (H) 08/09/2024   CREATININE 1.67 (H) 08/09/2024   GLUCOSE 156 (H) 08/09/2024   GFRNONAA 42 (L) 08/09/2024   GFRAA 68 11/04/2020   CALCIUM  9.1 08/09/2024   PROT 8.7 (H) 08/08/2024   ALBUMIN  4.6 08/08/2024   BILITOT 0.7 08/08/2024   ALKPHOS 92 08/08/2024   AST 25  08/08/2024   ALT 18 08/08/2024   ANIONGAP 13 08/09/2024    CBG (last 3)  No results for input(s): GLUCAP in the last 72 hours.    Coagulation Profile: Recent Labs  Lab 08/08/24 1857  INR 2.8*     Radiology Studies: CT ANGIO GI BLEED Result Date: 08/08/2024 CLINICAL DATA:  Lower GI bleeding. Rectal  bleeding. History of non-small cell lung cancer. EXAM: CTA ABDOMEN AND PELVIS WITHOUT AND WITH CONTRAST TECHNIQUE: Multidetector CT imaging of the abdomen and pelvis was performed using the standard protocol during bolus administration of intravenous contrast. Multiplanar reconstructed images and MIPs were obtained and reviewed to evaluate the vascular anatomy. RADIATION DOSE REDUCTION: This exam was performed according to the departmental dose-optimization program which includes automated exposure control, adjustment of the mA and/or kV according to patient size and/or use of iterative reconstruction technique. CONTRAST:  80mL OMNIPAQUE  IOHEXOL  350 MG/ML SOLN COMPARISON:  CT chest 07/26/2024. PET-CT 12/13/2020. FINDINGS: VASCULAR Aorta: No evidence for aneurysm or dissection. Aorto bi-iliac bypass graft is present with anastomoses in the bilateral common femoral arteries. The aortic portion of the graft is patent. There is no significant stenosis. Aorta is heavily calcified. Celiac: Patent without evidence of aneurysm, dissection, vasculitis or significant stenosis. Calcified atherosclerotic disease present. SMA: Severe atherosclerotic disease present. There is a occlusion of the proximal 2.5 cm of the superior mesenteric artery. The mid and distal artery appear patent. Renals: Severe atherosclerotic disease present. There severe stenosis of the origin of the bilateral renal arteries. No evidence for dissection or aneurysm. IMA: Not visualized. Inflow: The bilateral iliac grafts are widely patent. The native bilateral external and internal iliac arteries are occluded. Proximal Outflow: Right common  femoral and proximal profunda femoral artery appear within normal limits and patent. Profunda femoral artery graft present. There is aneurysmal dilatation of the proximal aspect of the left profunda femoral artery measuring 5.3 cm in length and 2.2 by 2.4 cm in largest AP and transverse dimensions. There some surrounding thickening of the arterial wall at this level. The common femoral artery and superficial femoral artery appear patent and within normal limits. Veins: No obvious venous abnormality within the limitations of this arterial phase study. Review of the MIP images confirms the above findings. NON-VASCULAR Lower chest: There smooth interlobular septal thickening and ground-glass opacities in the lung bases with minimal patchy airspace opacities in the right lung base. Findings are unchanged from 07/26/2024. Hepatobiliary: No focal liver abnormality is seen. No gallstones, gallbladder wall thickening, or biliary dilatation. Pancreas: Unremarkable. No pancreatic ductal dilatation or surrounding inflammatory changes. Spleen: Spleen is not visualized. Adrenals/Urinary Tract: Adrenal glands are unremarkable. Kidneys are normal, without renal calculi, focal lesion, or hydronephrosis. Bladder is unremarkable. Stomach/Bowel: There is a small amount of acute hemorrhage of the anorectal junction on the right. No other areas of hemorrhage are identified. There is no bowel obstruction, wall thickening, pneumatosis or free air. The appendix is within normal limits. There is diffuse colonic diverticulosis. The stomach is normal. Lymphatic: No enlarged lymph nodes are identified. Reproductive: Prostate is mildly enlarged. Other: No abdominal wall hernia or abnormality. No abdominopelvic ascites. Musculoskeletal: No acute or significant osseous findings. IMPRESSION: 1. Small amount of acute hemorrhage at the anorectal junction on the right. 2. Aorto bi-iliac bypass graft is patent. 3. Severe atherosclerotic disease of the  superior mesenteric artery with occlusion of the proximal 2.5 cm of the superior mesenteric artery. The mid and distal artery appear patent. 4. Severe stenosis of the origin of the bilateral renal arteries. 5. Aneurysmal dilatation of the proximal left profunda femoral artery measuring 5.3 x 2.2 x 2.4 cm. There is some surrounding thickening of the arterial wall at this level. 6. Colonic diverticulosis. 7. Stable interlobular septal thickening and ground-glass opacities in the lung bases with minimal patchy airspace opacities in the right lung base. Findings may be related to  pulmonary edema or infection. Aortic Atherosclerosis (ICD10-I70.0). Electronically Signed   By: Greig Pique M.D.   On: 08/08/2024 23:13       Elgie Butter M.D. [facility] 08/10/2024, 1:10 PM  Available via Epic secure chat 7am-7pm After 7 pm, please refer to night coverage provider listed on amion.

## 2024-08-10 NOTE — Telephone Encounter (Signed)
 Pt currently admitted to hospital. Expected d/c date 09/06. Deferring out for two days

## 2024-08-10 NOTE — Plan of Care (Signed)

## 2024-08-10 NOTE — Telephone Encounter (Signed)
-----   Message from Malvina New sent at 08/09/2024  8:21 PM EDT ----- I need to see this patient in 1 month with a CTA BiFem to eval a femoral pseudo.  [facility] patient

## 2024-08-10 NOTE — Progress Notes (Signed)
 Daily Progress Note  DOA: 08/08/2024 Hospital Day: 3  Cc: Lower GI bleeding  ASSESSMENT    78 y.o. year old male with multiple medical problems admitted with painless lower on warfarin. Suspect diverticular hemorrhage .  CT angiogram showed a small amount of acute hemorrhage at the anorectal junction on the right but volume up blood loss with coinciding drop in hemoglobin seems more consistent with a diverticular hemorrhage . TODAY: Hemoglobin stable overnight at 11.4 but down overall from baseline of 16. Last BM /passage of blood was yesterday around 4 PM when he had a darker colored bowel movement with a small amount of dark clotted blood  Involuntary weight loss (mild) Unexplained weight loss of up approximately 10 pounds over the last several months due to diminished appetite.  The only new medication that he has started has been amiodarone .    History of adenomatous colon polyps History of post-polypectomy bleed at time of last colonoscopy in 2020 It was previously decided following his last colonoscopy there would be no further colonoscopies due to his age and and co-morbidities    Peripheral vascular disease Atrial Fibrillation Aortic stenosis s/p mechanical AVR on warfarin  HFrEF Transesophageal echo February 2025 EF 50 to 55%   CAD s/p CABG INR therapeutic at 2.8   COPD History of lung cancer s/p XRT ~ 3 yrs ago   CKD3a   History of CVA / TIA   See [facility] for additional medical problems.   Principal Problem:   Acute lower GI bleeding Active Problems:   Coronary artery disease   Peripheral vascular disease (HCC)   Hypertension   GI bleed   Paroxysmal atrial fibrillation (HCC)   Squamous cell carcinoma of left lung (HCC)   Presence of prosthetic heart valve   Type 2 diabetes mellitus with diabetic peripheral angiopathy without gangrene (HCC)   Occlusion of superior mesenteric artery (HCC)   (HFpEF) heart failure with preserved ejection fraction  (HCC)    PLAN   --Will give regular diet --If no further bleeding he should be okay from GI standpoint to go home later today or in the morning --Our office will call him with a follow-up appointment   Subjective   Feels fine . Last BM /passage of blood was yesterday around 4 PM when he had a darker colored bowel movement with a small amount of dark clotted blood  Objective    Recent Labs    08/08/24 1857 08/09/24 0841 08/09/24 1105 08/09/24 1838 08/09/24 2354 08/10/24 0716  WBC 8.0 7.5  --   --   --   --   HGB 16.3 13.2   < > 12.8* 11.5* 11.4*  HCT 48.8 40.2   < > 39.8 34.9* 34.9*  MCV 94.6 95.0  --   --   --   --   PLT 239 214  --   --   --   --    < > = values in this interval not displayed.   No results for input(s): FOLATE, VITAMINB12, FERRITIN, TIBC, IRONPCTSAT in the last 72 hours. Recent Labs    08/08/24 1857 08/09/24 0841  NA 138 141  K 4.9 4.8  CL 100 104  CO2 24 24  GLUCOSE 198* 156*  BUN 49* 48*  CREATININE 1.59* 1.67*  CALCIUM  10.5* 9.1   Recent Labs    08/08/24 1857  PROT 8.7*  ALBUMIN  4.6  AST 25  ALT 18  ALKPHOS 92  BILITOT 0.7  Scheduled inpatient medications:   amiodarone   200 mg Oral q AM   carvedilol   12.5 mg Oral BID   Continuous inpatient infusions:  PRN inpatient medications: acetaminophen  **OR** acetaminophen , ondansetron  **OR** ondansetron  (ZOFRAN ) IV  Vital signs in last 24 hours: Temp:  [97.9 F (36.6 C)-98.3 F (36.8 C)] 97.9 F (36.6 C) (09/04 1100) Pulse Rate:  [69-92] 69 (09/04 1100) Resp:  [16-20] 16 (09/04 1100) BP: (98-142)/(31-71) 115/42 (09/04 1100) SpO2:  [92 %-99 %] 92 % (09/04 0802) Last BM Date : 08/09/24  Intake/Output Summary (Last 24 hours) at 08/10/2024 1302 Last data filed at 08/10/2024 0805 Gross per 24 hour  Intake --  Output 475 ml  Net -475 ml    Intake/Output from previous day: No intake/output data recorded. Intake/Output this shift: Total I/O In: -  Out: 475  [Blood:475]   Physical Exam:  General: Alert male in NAD Heart:  Regular rate .  Pulmonary: Normal respiratory effort Abdomen: Soft, nondistended, nontender. Normal bowel sounds. Extremities: No lower extremity edema  Neurologic: Alert and oriented Psych: Pleasant. Cooperative     LOS: 0 days   Vina Dasen ,NP 08/10/2024, 1:02 PM

## 2024-08-11 ENCOUNTER — Other Ambulatory Visit (HOSPITAL_COMMUNITY): Payer: Self-pay

## 2024-08-11 ENCOUNTER — Encounter: Payer: Self-pay | Admitting: Physician Assistant

## 2024-08-11 ENCOUNTER — Telehealth (HOSPITAL_COMMUNITY): Payer: Self-pay | Admitting: Pharmacy Technician

## 2024-08-11 DIAGNOSIS — Z833 Family history of diabetes mellitus: Secondary | ICD-10-CM | POA: Diagnosis not present

## 2024-08-11 DIAGNOSIS — E785 Hyperlipidemia, unspecified: Secondary | ICD-10-CM | POA: Diagnosis not present

## 2024-08-11 DIAGNOSIS — R634 Abnormal weight loss: Secondary | ICD-10-CM | POA: Diagnosis not present

## 2024-08-11 DIAGNOSIS — Z8249 Family history of ischemic heart disease and other diseases of the circulatory system: Secondary | ICD-10-CM | POA: Diagnosis not present

## 2024-08-11 DIAGNOSIS — Z85118 Personal history of other malignant neoplasm of bronchus and lung: Secondary | ICD-10-CM | POA: Diagnosis not present

## 2024-08-11 DIAGNOSIS — I2510 Atherosclerotic heart disease of native coronary artery without angina pectoris: Secondary | ICD-10-CM | POA: Diagnosis not present

## 2024-08-11 DIAGNOSIS — Z952 Presence of prosthetic heart valve: Secondary | ICD-10-CM | POA: Diagnosis not present

## 2024-08-11 DIAGNOSIS — E1151 Type 2 diabetes mellitus with diabetic peripheral angiopathy without gangrene: Secondary | ICD-10-CM | POA: Diagnosis not present

## 2024-08-11 DIAGNOSIS — I5032 Chronic diastolic (congestive) heart failure: Secondary | ICD-10-CM | POA: Diagnosis not present

## 2024-08-11 DIAGNOSIS — Z951 Presence of aortocoronary bypass graft: Secondary | ICD-10-CM | POA: Diagnosis not present

## 2024-08-11 DIAGNOSIS — Z923 Personal history of irradiation: Secondary | ICD-10-CM | POA: Diagnosis not present

## 2024-08-11 DIAGNOSIS — J449 Chronic obstructive pulmonary disease, unspecified: Secondary | ICD-10-CM | POA: Diagnosis not present

## 2024-08-11 DIAGNOSIS — N1831 Chronic kidney disease, stage 3a: Secondary | ICD-10-CM | POA: Diagnosis not present

## 2024-08-11 DIAGNOSIS — K922 Gastrointestinal hemorrhage, unspecified: Secondary | ICD-10-CM | POA: Diagnosis not present

## 2024-08-11 DIAGNOSIS — K55069 Acute infarction of intestine, part and extent unspecified: Secondary | ICD-10-CM | POA: Diagnosis not present

## 2024-08-11 DIAGNOSIS — E1122 Type 2 diabetes mellitus with diabetic chronic kidney disease: Secondary | ICD-10-CM | POA: Diagnosis not present

## 2024-08-11 DIAGNOSIS — I130 Hypertensive heart and chronic kidney disease with heart failure and stage 1 through stage 4 chronic kidney disease, or unspecified chronic kidney disease: Secondary | ICD-10-CM | POA: Diagnosis not present

## 2024-08-11 DIAGNOSIS — Z8673 Personal history of transient ischemic attack (TIA), and cerebral infarction without residual deficits: Secondary | ICD-10-CM | POA: Diagnosis not present

## 2024-08-11 DIAGNOSIS — Z95820 Peripheral vascular angioplasty status with implants and grafts: Secondary | ICD-10-CM | POA: Diagnosis not present

## 2024-08-11 DIAGNOSIS — Z79899 Other long term (current) drug therapy: Secondary | ICD-10-CM | POA: Diagnosis not present

## 2024-08-11 DIAGNOSIS — Z7984 Long term (current) use of oral hypoglycemic drugs: Secondary | ICD-10-CM | POA: Diagnosis not present

## 2024-08-11 DIAGNOSIS — Z7901 Long term (current) use of anticoagulants: Secondary | ICD-10-CM | POA: Diagnosis not present

## 2024-08-11 DIAGNOSIS — I480 Paroxysmal atrial fibrillation: Secondary | ICD-10-CM

## 2024-08-11 DIAGNOSIS — J8410 Pulmonary fibrosis, unspecified: Secondary | ICD-10-CM | POA: Diagnosis not present

## 2024-08-11 LAB — HEMOGLOBIN AND HEMATOCRIT, BLOOD
HCT: 32 % — ABNORMAL LOW (ref 39.0–52.0)
HCT: 34.6 % — ABNORMAL LOW (ref 39.0–52.0)
HCT: 37.1 % — ABNORMAL LOW (ref 39.0–52.0)
Hemoglobin: 10.6 g/dL — ABNORMAL LOW (ref 13.0–17.0)
Hemoglobin: 11.4 g/dL — ABNORMAL LOW (ref 13.0–17.0)
Hemoglobin: 12.1 g/dL — ABNORMAL LOW (ref 13.0–17.0)

## 2024-08-11 LAB — PROTIME-INR
INR: 1.9 — ABNORMAL HIGH (ref 0.8–1.2)
Prothrombin Time: 23 s — ABNORMAL HIGH (ref 11.4–15.2)

## 2024-08-11 LAB — GLUCOSE, CAPILLARY
Glucose-Capillary: 151 mg/dL — ABNORMAL HIGH (ref 70–99)
Glucose-Capillary: 230 mg/dL — ABNORMAL HIGH (ref 70–99)
Glucose-Capillary: 257 mg/dL — ABNORMAL HIGH (ref 70–99)

## 2024-08-11 MED ORDER — SACUBITRIL-VALSARTAN 24-26 MG PO TABS
1.0000 | ORAL_TABLET | Freq: Two times a day (BID) | ORAL | Status: DC
  Administered 2024-08-11: 1 via ORAL
  Filled 2024-08-11: qty 1

## 2024-08-11 MED ORDER — HEPARIN (PORCINE) 25000 UT/250ML-% IV SOLN
1000.0000 [IU]/h | INTRAVENOUS | Status: DC
  Administered 2024-08-11: 1000 [IU]/h via INTRAVENOUS
  Filled 2024-08-11: qty 250

## 2024-08-11 MED ORDER — WARFARIN SODIUM 5 MG PO TABS
5.0000 mg | ORAL_TABLET | Freq: Once | ORAL | Status: AC
  Administered 2024-08-11: 5 mg via ORAL
  Filled 2024-08-11: qty 1

## 2024-08-11 MED ORDER — ATORVASTATIN CALCIUM 80 MG PO TABS
80.0000 mg | ORAL_TABLET | Freq: Every day | ORAL | Status: DC
  Administered 2024-08-11: 80 mg via ORAL
  Filled 2024-08-11: qty 1

## 2024-08-11 MED ORDER — EMPAGLIFLOZIN 25 MG PO TABS: 25.0000 mg | ORAL_TABLET | Freq: Every day | ORAL | Status: DC

## 2024-08-11 MED ORDER — GLYBURIDE 5 MG PO TABS
5.0000 mg | ORAL_TABLET | Freq: Two times a day (BID) | ORAL | Status: DC
  Filled 2024-08-11 (×2): qty 1

## 2024-08-11 MED ORDER — SPIRONOLACTONE 25 MG PO TABS
25.0000 mg | ORAL_TABLET | Freq: Every day | ORAL | Status: DC
  Administered 2024-08-11: 25 mg via ORAL
  Filled 2024-08-11: qty 1

## 2024-08-11 MED ORDER — ENOXAPARIN SODIUM 120 MG/0.8ML IJ SOSY
120.0000 mg | PREFILLED_SYRINGE | INTRAMUSCULAR | 0 refills | Status: DC
  Filled 2024-08-11: qty 5.6, 7d supply, fill #0

## 2024-08-11 MED ORDER — WARFARIN - PHARMACIST DOSING INPATIENT: Freq: Every day | Status: DC

## 2024-08-11 MED ORDER — FUROSEMIDE 40 MG PO TABS
40.0000 mg | ORAL_TABLET | Freq: Every day | ORAL | Status: DC
  Administered 2024-08-11: 40 mg via ORAL
  Filled 2024-08-11: qty 1

## 2024-08-11 NOTE — Telephone Encounter (Signed)
 Patient Product/process development scientist completed.    The patient is insured through [employer]/ [employer]. Patient has Medicare and is not eligible for a copay card, but may be able to apply for patient assistance or Medicare RX Payment Plan (Patient Must reach out to their plan, if eligible for payment plan), if available.    Ran test claim for enoxaparin  (Lovenox ) 80 mg/0.8 ml and the current 30 day co-pay is $0.00.   This test claim was processed through [facility]- copay amounts may vary at other pharmacies due to pharmacy/plan contracts, or as the patient moves through the different stages of their insurance plan.     Reyes Sharps, CPHT Pharmacy Technician III Certified Patient Advocate [facility] Patient Advocate Team Direct Number: [phone]  Fax: [phone]

## 2024-08-11 NOTE — Discharge Summary (Addendum)
 Physician Discharge Summary   Patient: [name] [name] MRN: [id number] DOB: [birth year]  Admit date:     08/08/2024  Discharge date: 08/11/2024  Discharge Physician: Elgie Butter   PCP: [name] Rush, MD   Recommendations at discharge:  Please check INR on Monday and dose coumadin  as per the INR.  Please follow up cardiology as recommended.  Please follow up with pulmonology for the squamous cell carcinoma of the lung.  Recommend outpatient follow up with vascular surgery as recommended.   Discharge Diagnoses: Principal Problem:   Acute lower GI bleeding Active Problems:   GI bleed   Occlusion of superior mesenteric artery (HCC)   Coronary artery disease   Peripheral vascular disease (HCC)   Hypertension   Paroxysmal atrial fibrillation (HCC)   Squamous cell carcinoma of left lung (HCC)   Presence of prosthetic heart valve   Type 2 diabetes mellitus with diabetic peripheral angiopathy without gangrene (HCC)   (HFpEF) heart failure with preserved ejection fraction (HCC)  Resolved Problems:   * No resolved hospital problems. *  Hospital Course: [name] [name] is a 78 y.o. male with medical history significant of CAD s/p CABG, lung cancer status post SBRT, fibrotic lung changes, mechanical AVR on coumadin , PAF, stage III CKD extensive PAD, DM, HTN, HLD, ITP, TIA presenting with GI bleed.  GI consulted.  CT angio GI bleed showing small amount of acute hemorrhage in the anorectal junction, severe atherosclerotic disease of the SMA with occlusion. Aneurysmal dilatation of the proximal left profunda measuring 5.3 x 2.2 x 2.4 cm. Positive ground glass changes in the lung bases.     Assessment and Plan:   GI bleed Multiple episodes of bright red blood per rectum over the past 24 hours Noted Coumadin  and ASA  use in the setting of mechanical aortic valve and CAD Hemoglobin 16 dropped to 10.5 . Patient reports last coumadin  dose was on Thursday .  As bleeding appears to have  stopped, he was cleared by Gi for discharge.  he was started on IV heparin  for bridge,  and to see if he would have recurrent bleeding. but patient was adamant about going home, and he was discharged on lovenox  and coumadin .      Occlusion of superior mesenteric artery (HCC) Noted SMA occlusion on CT today   Also with Aneurysmal dilatation of the proximal left profunda femoral artery measuring 5.3 x 2.2 x 2.4 cm No active abdominal pain  Vascular surgery consulted, recommended outpatient follow up .      (HFpEF) heart failure with preserved ejection fraction (HCC) Transesophageal echo February 2025 EF 50 to 55% Euvolemic.  No chest pain or sob.  Restarted home meds including, entresto , spironolactone , lasix  and statin.    Type 2 diabetes mellitus with diabetic peripheral angiopathy without gangrene (HCC) Resume SSI. Last A1c is 8.6%.    Presence of prosthetic heart valve Patient has mechanical aortic valve on Coumadin  INR is 2.8 on admission, dropped to 1.9 today.  Anticoagulation held for active GI bleed and hemoglobin drop from 16 to 10. As there was no bleeding for 24 hours, he was   started on on IV heparin  for bridge and coumadin  today. But patient was adamant about going home, hence he was discharged on lovenox  and coumadin , recommended to get INR checked on MOnday.  His INR needs to be between 2.5 to 3.5.     Chronic kidney disease, stage 3a (HCC) Creatinine 1.6 with GFR of 40 Looks to be near baseline Monitor  Squamous cell carcinoma of left lung (HCC) ILD  Followed by [facility] outpatient  s/p SBRT completed on 02/25/2021  Prior imaging with interstitial process in the left lower lobe which has increased in the right lung base-? Fibrotic disease  Recommend outpatient follow upwith pulmonology.      Paroxysmal atrial fibrillation (HCC) Long history of PAF with RVR S/P multiple DCCVs and failed AF ablation (12/21)  Rate relatively stable at  present  Restarted coumadin  for anti coagulation.  Rate controlled on amiodarone  and coreg .      Hypertension Well controlled on coreg .    Peripheral vascular disease (HCC) Noted baseline extensive history of peripheral vascular disease with Aortobifemoral bypass in 2016 with follow up iliac stenting and femoral endarterectomy. S/p ostial and mid right SFA intervention 2017     Coronary artery disease [place] hx/o CAD s/p CABG 11/2003  Holding ASA in setting of GI bleeding  No chest pain or sob.        Estimated body mass index is 24.16 kg/m as calculated from the following:   Height as of this encounter: 5' 8 (1.727 m).   Weight as of this encounter: 72.1 kg.    Consultants: gastroenterology Procedures performed: none.   Disposition: Home Diet recommendation:  Discharge Diet Orders (From admission, onward)     Start     Ordered   08/11/24 0000  Diet - low sodium heart healthy        08/11/24 1606           Carb modified diet DISCHARGE MEDICATION: Allergies as of 08/11/2024   No Known Allergies      Medication List     TAKE these medications    amiodarone  200 MG tablet Commonly known as: PACERONE  Take 100 mg by mouth in the morning.   aspirin  EC 81 MG tablet Take 1 tablet (81 mg total) by mouth daily.   atorvastatin  80 MG tablet Commonly known as: LIPITOR  Take 80 mg by mouth in the morning.   carvedilol  6.25 MG tablet Commonly known as: COREG  Take 6.25 mg by mouth 2 (two) times daily with a meal.   enoxaparin  120 MG/0.8ML injection Commonly known as: Lovenox  Inject 0.8 mLs (120 mg total) into the skin daily for 7 days.   Entresto  24-26 MG Generic drug: sacubitril -valsartan  TAKE 1 TABLET BY MOUTH TWICE A DAY   Evolocumab 140 MG/ML Soaj Inject 140 mg into the skin every 14 (fourteen) days.   fluorouracil 5 % cream Commonly known as: EFUDEX Apply 1 Application topically daily as needed (dry skin).   furosemide  20 MG tablet Commonly known  as: LASIX  Take 2 tablets (40 mg total) by mouth daily. May take extra 20 mg as needed for wt gain, swelling, or increase shortness of breath.   glyBURIDE  2.5 MG tablet Commonly known as: DIABETA  Take 5 mg by mouth 2 (two) times daily with a meal.   Jardiance  25 MG Tabs tablet Generic drug: empagliflozin  Take 25 mg by mouth in the morning.   nitroGLYCERIN  0.4 MG SL tablet Commonly known as: NITROSTAT  Place 1 tablet (0.4 mg total) under the tongue every 5 (five) minutes x 3 doses as needed for chest pain.   OneTouch Delica Lancets 33G Misc   OneTouch Verio IQ System w/Device Kit   OneTouch Verio test strip Generic drug: glucose blood   spironolactone  25 MG tablet Commonly known as: ALDACTONE  TAKE 1 TABLET BY MOUTH EVERY DAY   warfarin 5 MG tablet Commonly known  as: COUMADIN  Take as directed. If you are unsure how to take this medication, talk to your nurse or doctor. Original instructions: Take 1/2 tablet to 1 tablet by mouth daily as prescribed by the coumadin  clinic. What changed:  how much to take how to take this when to take this additional instructions        Follow-up Information     [name] Rush, MD. Schedule an appointment as soon as possible for a visit in 1 week(s).   Specialty: Internal Medicine Contact information: [address] [place] [address] [phone]         [facility] [address] [facility] Follow up on 08/14/2024.   Specialty: Cardiology Why: to CHECK INR. Contact information: [address] [place]  [address] [phone]               Discharge Exam: Fredricka Weights   08/08/24 1835 08/09/24 0054  Weight: 73 kg 72.1 kg   General exam: Appears calm and comfortable  Respiratory system: Clear to auscultation. Respiratory effort normal. Cardiovascular system: S1 & S2 heard, RRR. No JVD,  Gastrointestinal system: Abdomen is nondistended, soft and nontender. Central nervous system: Alert and  oriented. No focal neurological deficits. Extremities: Symmetric 5 x 5 power. Skin: No rashes, Psychiatry: Mood & affect appropriate.    Condition at discharge: fair  The results of significant diagnostics from this hospitalization (including imaging, microbiology, ancillary and laboratory) are listed below for reference.   Imaging Studies: CT ANGIO GI BLEED Result Date: 08/08/2024 CLINICAL DATA:  Lower GI bleeding. Rectal bleeding. History of non-small cell lung cancer. EXAM: CTA ABDOMEN AND PELVIS WITHOUT AND WITH CONTRAST TECHNIQUE: Multidetector CT imaging of the abdomen and pelvis was performed using the standard protocol during bolus administration of intravenous contrast. Multiplanar reconstructed images and MIPs were obtained and reviewed to evaluate the vascular anatomy. RADIATION DOSE REDUCTION: This exam was performed according to the departmental dose-optimization program which includes automated exposure control, adjustment of the mA and/or kV according to patient size and/or use of iterative reconstruction technique. CONTRAST:  80mL OMNIPAQUE  IOHEXOL  350 MG/ML SOLN COMPARISON:  CT chest 07/26/2024. PET-CT 12/13/2020. FINDINGS: VASCULAR Aorta: No evidence for aneurysm or dissection. Aorto bi-iliac bypass graft is present with anastomoses in the bilateral common femoral arteries. The aortic portion of the graft is patent. There is no significant stenosis. Aorta is heavily calcified. Celiac: Patent without evidence of aneurysm, dissection, vasculitis or significant stenosis. Calcified atherosclerotic disease present. SMA: Severe atherosclerotic disease present. There is a occlusion of the proximal 2.5 cm of the superior mesenteric artery. The mid and distal artery appear patent. Renals: Severe atherosclerotic disease present. There severe stenosis of the origin of the bilateral renal arteries. No evidence for dissection or aneurysm. IMA: Not visualized. Inflow: The bilateral iliac grafts are  widely patent. The native bilateral external and internal iliac arteries are occluded. Proximal Outflow: Right common femoral and proximal profunda femoral artery appear within normal limits and patent. Profunda femoral artery graft present. There is aneurysmal dilatation of the proximal aspect of the left profunda femoral artery measuring 5.3 cm in length and 2.2 by 2.4 cm in largest AP and transverse dimensions. There some surrounding thickening of the arterial wall at this level. The common femoral artery and superficial femoral artery appear patent and within normal limits. Veins: No obvious venous abnormality within the limitations of this arterial phase study. Review of the MIP images confirms the above findings. NON-VASCULAR Lower chest: There smooth interlobular septal  thickening and ground-glass opacities in the lung bases with minimal patchy airspace opacities in the right lung base. Findings are unchanged from 07/26/2024. Hepatobiliary: No focal liver abnormality is seen. No gallstones, gallbladder wall thickening, or biliary dilatation. Pancreas: Unremarkable. No pancreatic ductal dilatation or surrounding inflammatory changes. Spleen: Spleen is not visualized. Adrenals/Urinary Tract: Adrenal glands are unremarkable. Kidneys are normal, without renal calculi, focal lesion, or hydronephrosis. Bladder is unremarkable. Stomach/Bowel: There is a small amount of acute hemorrhage of the anorectal junction on the right. No other areas of hemorrhage are identified. There is no bowel obstruction, wall thickening, pneumatosis or free air. The appendix is within normal limits. There is diffuse colonic diverticulosis. The stomach is normal. Lymphatic: No enlarged lymph nodes are identified. Reproductive: Prostate is mildly enlarged. Other: No abdominal wall hernia or abnormality. No abdominopelvic ascites. Musculoskeletal: No acute or significant osseous findings. IMPRESSION: 1. Small amount of acute hemorrhage at  the anorectal junction on the right. 2. Aorto bi-iliac bypass graft is patent. 3. 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 The mid and distal artery appear patent. 4. Severe stenosis of the origin of the bilateral renal arteries. 5. Aneurysmal dilatation of the proximal left profunda femoral artery measuring 5.3 x 2.2 x 2.4 cm. There is some surrounding thickening of the arterial wall at this level. 6. Colonic diverticulosis. 7. Stable interlobular septal thickening and ground-glass opacities in the lung bases with minimal patchy airspace opacities in the right lung base. Findings may be related to pulmonary edema or infection. Aortic Atherosclerosis (ICD10-I70.0). Electronically Signed   By: Greig Pique M.D.   On: 08/08/2024 23:13   CT CHEST WO CONTRAST Result Date: 07/28/2024 CLINICAL DATA:  Non-small cell lung cancer restaging * Tracking Code: BO * EXAM: CT CHEST WITHOUT CONTRAST TECHNIQUE: Multidetector CT imaging of the chest was performed following the standard protocol without IV contrast. RADIATION DOSE REDUCTION: This exam was performed according to the departmental dose-optimization program which includes automated exposure control, adjustment of the mA and/or kV according to patient size and/or use of iterative reconstruction technique. COMPARISON:  01/20/2024 FINDINGS: Cardiovascular: Severe pipe like aortic atherosclerosis. Aortic valve prosthesis. Cardiomegaly. Extensive three-vessel coronary artery calcifications status post median sternotomy. No pericardial effusion. Mediastinum/Nodes: No enlarged mediastinal, hilar, or axillary lymph nodes. Thyroid  gland, trachea, and esophagus demonstrate no significant findings. Lungs/Pleura: Moderate centrilobular and paraseptal emphysema. Diffuse bilateral bronchial wall thickening. Extensive interlobular septal thickening and ground-glass opacity throughout the  bilateral lung bases, substantially increased in the right lung base, similar in the left lung base (series 3, image 130). Unchanged post treatment appearance of the posterior lingula with dense consolidation about marking clips (series 3, image 75). Unchanged subpleural nodule of the anterior right middle lobe measuring 0.4 cm (series 3, image 114). No pleural effusion or pneumothorax. Upper Abdomen: No acute abnormality.  Colonic diverticulosis. Musculoskeletal: Lipoma of the anterior right chest (series 2, image 38). No acute osseous findings. IMPRESSION: 1. Unchanged post treatment appearance of the posterior lingula with dense consolidation about marking clips. No evidence of recurrent or metastatic disease in the chest. 2. Extensive interlobular septal thickening and ground-glass opacity throughout the bilateral lung bases, substantially increased in the right lung base, similar in the left lung base. This may reflect edema superimposed on emphysema or other nonspecific infection/inflammation, potentially including fibrotic interstitial lung disease given apparent progression. 3. Emphysema and diffuse bilateral bronchial wall thickening. 4. Cardiomegaly and coronary artery  disease. Aortic Atherosclerosis (ICD10-I70.0) and Emphysema (ICD10-J43.9). Electronically Signed   By: Marolyn JONETTA Jaksch M.D.   On: 07/28/2024 21:34    Microbiology: Results for orders placed or performed during the hospital encounter of 03/19/23  Fungus Culture With Stain     Status: None   Collection Time: 03/19/23  8:37 AM   Specimen: Bronchial Alveolar Lavage; Respiratory  Result Value Ref Range Status   Fungus Stain Final report  Final   Fungus (Mycology) Culture Final report  Final    Comment: (NOTE) Performed At: [facility] [address] [place], [place] [id number] Jennette Shorter MD Ey:[id number]    Fungal Source BRONCHIAL ALVEOLAR LAVAGE  Final    Comment: Performed at [facility], [address]  [place], [place] [address]  Culture, Respiratory w Gram Stain     Status: None   Collection Time: 03/19/23  8:37 AM   Specimen: Bronchial Alveolar Lavage; Respiratory  Result Value Ref Range Status   Specimen Description BRONCHIAL ALVEOLAR LAVAGE  Final   Special Requests NONE  Final   Gram Stain NO WBC SEEN NO ORGANISMS SEEN   Final   Culture   Final    NO GROWTH 2 DAYS Performed at [facility], [address] [place], [place] [address]    Report Status 03/22/2023 FINAL  Final  Acid Fast Culture with reflexed sensitivities     Status: None   Collection Time: 03/19/23  8:37 AM   Specimen: Bronchial Alveolar Lavage; Respiratory  Result Value Ref Range Status   Acid Fast Culture Negative  Final    Comment: (NOTE) No acid fast bacilli isolated after 6 weeks. Performed At: [facility] [address] [place], [place] [id number] Jennette Shorter MD Ey:[id number]    Source of Sample BRONCHIAL ALVEOLAR LAVAGE  Final    Comment: Performed at [facility], [address] [place], [place] [address]  Acid Fast Smear (AFB)     Status: None   Collection Time: 03/19/23  8:37 AM   Specimen: Bronchial Alveolar Lavage; Respiratory  Result Value Ref Range Status   AFB Specimen Processing Concentration  Final   Acid Fast Smear Negative  Final    Comment: (NOTE) Performed At: [facility] [address] [place], [place] [id number] Jennette Shorter MD Ey:[id number]    Source (AFB) BRONCHIAL ALVEOLAR LAVAGE  Final    Comment: Performed at [facility], [address], [place], [place] [address]  Anaerobic culture w Gram Stain     Status: None   Collection Time: 03/19/23  8:37 AM   Specimen: Bronchial Alveolar Lavage; Respiratory  Result Value Ref Range Status   Specimen Description BRONCHIAL ALVEOLAR LAVAGE  Final   Special Requests NONE  Final   Gram Stain   Final    RARE WBC PRESENT, PREDOMINANTLY PMN NO ORGANISMS SEEN    Culture   Final    NO ANAEROBES  ISOLATED Performed at [facility], [address], [place], [place] [address]    Report Status 03/24/2023 FINAL  Final  Fungus Culture Result     Status: None   Collection Time: 03/19/23  8:37 AM  Result Value Ref Range Status   Result 1 Comment  Final    Comment: (NOTE) KOH/Calcofluor preparation:  no fungus observed. Performed At: [facility] [address] [place], [place] [id number] Jennette Shorter MD Ey:[id number]   Fungal organism reflex     Status: None   Collection Time: 03/19/23  8:37 AM  Result Value Ref Range Status   Fungal result 1 Comment  Final    Comment: (NOTE) No yeast or mold isolated after 4 weeks. Performed At: [facility] [address] [place], [place] [id number] Jennette Shorter MD Ey:[id number]    *Note: Due to a large number of results and/or encounters for the requested time period, some results have not been displayed. A complete set of results can be found in Results Review.    Labs: CBC: Recent Labs  Lab 08/08/24 1857 08/09/24 0841 08/09/24 1105 08/10/24 1228 08/10/24 1827 08/11/24 0016 08/11/24 0622 08/11/24 1137  WBC 8.0 7.5  --   --   --   --   --   --   HGB 16.3 13.2   < > 11.4* 11.5* 11.4* 10.6* 12.1*  HCT 48.8 40.2   < > 35.5* 35.4* 34.6* 32.0* 37.1*  MCV 94.6 95.0  --   --   --   --   --   --   PLT 239 214  --   --   --   --   --   --    < > = values in this interval not displayed.   Basic Metabolic Panel: Recent Labs  Lab 08/08/24 1857 08/09/24 0841  NA 138 141  K 4.9 4.8  CL 100 104  CO2 24 24  GLUCOSE 198* 156*  BUN 49* 48*  CREATININE 1.59* 1.67*  CALCIUM  10.5* 9.1   Liver Function Tests: Recent Labs  Lab 08/08/24 1857  AST 25  ALT 18  ALKPHOS 92  BILITOT 0.7  PROT 8.7*  ALBUMIN  4.6   CBG: Recent Labs  Lab 08/10/24 1619 08/10/24 2038 08/11/24 0809 08/11/24 1152  GLUCAP 278* 231* 151* 230*    Discharge time spent: 45 minutes.   Signed: Kaleah Hagemeister, MD [facility] 08/11/2024

## 2024-08-11 NOTE — Plan of Care (Signed)

## 2024-08-11 NOTE — Progress Notes (Addendum)
 PHARMACY - ANTICOAGULATION CONSULT NOTE  Pharmacy Consult for warfarin, heparin   Indication: mechanical valve  No Known Allergies  Patient Measurements: Height: 5' 8 (172.7 cm) Weight: 72.1 kg (158 lb 14.4 oz) IBW/kg (Calculated) : 68.4 HEPARIN  DW (KG): 72.1  Vital Signs: Temp: 98 F (36.7 C) (09/05 0738) Temp Source: Oral (09/05 0738) BP: 140/51 (09/05 0738) Pulse Rate: 78 (09/05 0738)  Labs: Recent Labs    08/08/24 1857 08/09/24 0841 08/09/24 1105 08/10/24 1827 08/11/24 0016 08/11/24 0622  HGB 16.3 13.2   < > 11.5* 11.4* 10.6*  HCT 48.8 40.2   < > 35.4* 34.6* 32.0*  PLT 239 214  --   --   --   --   LABPROT 30.4*  --   --   --   --   --   INR 2.8*  --   --   --   --   --   CREATININE 1.59* 1.67*  --   --   --   --    < > = values in this interval not displayed.    Estimated Creatinine Clearance: 35.3 mL/min (A) (by C-G formula based on SCr of 1.67 mg/dL (H)).   Medical History: Past Medical History:  Diagnosis Date   Adenomatous colon polyp 09/1997   Anemia    Aortic stenosis    s/p [facility] mechanical AVR - Chronic Coumadin    Blood transfusion    related to ITP   CHF (congestive heart failure) (HCC)    COPD (chronic obstructive pulmonary disease) (HCC)    patient denies this dx on 01/27/21   Coronary artery disease    s/p cabg x 3 11/2003: lima-lad, seq vg to rpda and rpl   Diverticulitis of colon    Dysrhythmia    a-fib   GERD (gastroesophageal reflux disease)    patient states he has never had reflux   Heart murmur    History of radiation therapy 02/18/2021, 02/20/2021, 02/25/2021   SBRT to left lung     Dr Lynwood Nasuti   Hyperlipidemia    Hypertension    ITP (idiopathic thrombocytopenic purpura)    Lung cancer (HCC)    last week   Peripheral arterial disease (HCC)    a. history of aortobifemoral bypass grafting by Dr. Krystal early b. LE angiography 04/22/2015 patent aortobifem graft, DES to R SFA   Peripheral vascular disease (HCC)    s/p Left  external Iliac Artery stenting and subsequent left femoral endarterectomy 02/2011- post op course complicated by wound infxn req I&D 03/2011   Renal artery stenosis, native, bilateral (HCC)    a. bilateral renal artery stenosis by recent duplex ultrasound b. L renal artery stent 02/2015, R renal artery patent on angiogram   Stroke [facility]) 2015   TIA (transient ischemic attack) ~ 2013   Type II diabetes mellitus (HCC)     Medications:  Medications Prior to Admission  Medication Sig Dispense Refill Last Dose/Taking   amiodarone  (PACERONE ) 200 MG tablet Take 100 mg by mouth in the morning.   08/08/2024 Morning   aspirin  EC 81 MG tablet Take 1 tablet (81 mg total) by mouth daily.   08/08/2024 Morning   atorvastatin  (LIPITOR ) 80 MG tablet Take 80 mg by mouth in the morning.   08/08/2024 Morning   carvedilol  (COREG ) 6.25 MG tablet Take 6.25 mg by mouth 2 (two) times daily with a meal.   08/08/2024 Morning   Evolocumab 140 MG/ML SOAJ Inject 140 mg into the skin  every 14 (fourteen) days.   Past Week   fluorouracil (EFUDEX) 5 % cream Apply 1 Application topically daily as needed (dry skin).   Past Month   furosemide  (LASIX ) 20 MG tablet Take 2 tablets (40 mg total) by mouth daily. May take extra 20 mg as needed for wt gain, swelling, or increase shortness of breath. 200 tablet 3 08/08/2024 Morning   glyBURIDE  (DIABETA ) 2.5 MG tablet Take 5 mg by mouth 2 (two) times daily with a meal.   08/08/2024 Morning   JARDIANCE  25 MG TABS tablet Take 25 mg by mouth in the morning.   08/08/2024 Morning   sacubitril -valsartan  (ENTRESTO ) 24-26 MG TAKE 1 TABLET BY MOUTH TWICE A DAY 180 tablet 3 08/08/2024 Morning   spironolactone  (ALDACTONE ) 25 MG tablet TAKE 1 TABLET BY MOUTH EVERY DAY 90 tablet 3 08/08/2024 Morning   warfarin (COUMADIN ) 5 MG tablet Take 1/2 tablet to 1 tablet by mouth daily as prescribed by the coumadin  clinic. (Patient taking differently: Take 2.5-5 mg by mouth See admin instructions. Take 5 mg tablet by mouth except on  Mondays and Fridays take 2.5 mg by mouth per patient) 95 tablet 0 08/08/2024 at 11:00 AM   Blood Glucose Monitoring Suppl (ONETOUCH VERIO IQ SYSTEM) w/Device KIT       glucose blood (ONETOUCH VERIO) test strip       nitroGLYCERIN  (NITROSTAT ) 0.4 MG SL tablet Place 1 tablet (0.4 mg total) under the tongue every 5 (five) minutes x 3 doses as needed for chest pain. 25 tablet 12 Unknown   OneTouch Delica Lancets 33G MISC       Scheduled:   amiodarone   200 mg Oral q AM   carvedilol   12.5 mg Oral BID   insulin  aspart  0-9 Units Subcutaneous TID WC    Assessment: 78 yo male here with concern of GIB. He is on warfarin PTA for a mechanical AVR and history of afib.  Hg stable and pharmacy consulted to restart warfarin and heparin   -CT angio GI bleed showing small amount of acute hemorrhage in the anorectal junction  -INR= 1.9 -Hg= 10.6  Home warfarin dose: 5mg /d except take 2.5mg  MF   Goal of Therapy:  INR 2.5-3.5 Heparin  level 0.3-0.7 Monitor platelets by anticoagulation protocol: Yes   Plan:  -no heparin  bolus -Start heparin  at 1000 units/hr -Heparin  level in 8 hours and daily wth CBC daily -Warfarin 5mg  po today -Daily PT/INR  Prentice Poisson, PharmD Clinical Pharmacist **Pharmacist phone directory can now be found on [email or web address] ([email or web address]).  Listed under [facility].

## 2024-08-11 NOTE — Progress Notes (Signed)
 [facility]                                                                               Loraine Freid, is a 78 y.o. male, DOB - 03-13-1946, FMW:[id number] Admit date - 08/08/2024    Outpatient Primary MD for the patient is Onita Rush, MD  LOS - 0  days    Brief summary   [name] [name] is a 78 y.o. male with medical history significant of CAD s/p CABG, lung cancer status post SBRT, fibrotic lung changes, mechanical AVR on coumadin , PAF, stage III CKD extensive PAD, DM, HTN, HLD, ITP, TIA presenting with GI bleed.  GI consulted.  CT angio GI bleed showing small amount of acute hemorrhage in the anorectal junction, severe atherosclerotic disease of the SMA with occlusion. Aneurysmal dilatation of the proximal left profunda measuring 5.3 x 2.2 x 2.4 cm. Positive ground glass changes in the lung bases.   Assessment & Plan    Assessment and Plan: GI bleed Multiple episodes of bright red blood per rectum over the past 24 hours Noted Coumadin  and ASA  use in the setting of mechanical aortic valve and CAD Hemoglobin 16 dropped to 10.5 today.  One dark bowel movement yesterday evening, not sure if there are clots. But there is no frank bleeding.  Advance diet without any issues.  Monitor overnight on IV heparin  and coumadin  and if there are no episodes of bleeding or drop in  hemoglobin, possible discharge home in the  morning.     Occlusion of superior mesenteric artery (HCC) Noted SMA occlusion on CT today   Also with Aneurysmal dilatation of the proximal left profunda femoral artery measuring 5.3 x 2.2 x 2.4 cm No active abdominal pain  Vascular surgery consulted, recommended outpatient follow up .    (HFpEF) heart failure with preserved ejection fraction (HCC) Transesophageal echo February 2025 EF 50 to 55% Euvolemic.  No chest pain or sob.  Restarted home meds including, entresto , spironolactone , lasix  and statin.   Type 2 diabetes mellitus with  diabetic peripheral angiopathy without gangrene (HCC) CBG (last 3)  Recent Labs    08/10/24 2038 08/11/24 0809 08/11/24 1152  GLUCAP 231* 151* 230*   Resume SSI. Last A1c is 8.6%.   Presence of prosthetic heart valve Patient has mechanical aortic valve on Coumadin  INR is 2.8 on admission, dropped to 1.9 today.  Anticoagulation held for active GI bleed and hemoglobin drop from 16 to 10. Patient started on on IV heparin  for bridge and coumadin  today.  Check INR in am and possibly discharge patient on Lovenox  and coumadin .   Chronic kidney disease, stage 3a (HCC) Creatinine 1.6 with GFR of 40 Looks to be near baseline Monitor    Squamous cell carcinoma of left lung (HCC) ILD  Followed by [facility] outpatient  s/p SBRT completed on 02/25/2021  Prior imaging with interstitial process in the left lower lobe which has increased in the right lung base-? Fibrotic disease  Recommend outpatient follow upwith pulmonology.     Paroxysmal atrial fibrillation (HCC) Long history of PAF with RVR S/P multiple DCCVs and failed AF ablation (12/21)  Rate relatively  stable at present  Restarted coumadin  for anti coagulation.  Rate controlled on amiodarone  and coreg .    Hypertension Well controlled on coreg .   Peripheral vascular disease (HCC) Noted baseline extensive history of peripheral vascular disease with Aortobifemoral bypass in 2016 with follow up iliac stenting and femoral endarterectomy. S/p ostial and mid right SFA intervention 2017   Coronary artery disease [place] hx/o CAD s/p CABG 11/2003  Holding ASA in setting of GI bleeding  No chest pain or sob.     Estimated body mass index is 24.16 kg/m as calculated from the following:   Height as of this encounter: 5' 8 (1.727 m).   Weight as of this encounter: 72.1 kg.  Code Status: full code.  DVT Prophylaxis:  Place TED hose Start: 08/09/24 0955 warfarin (COUMADIN ) tablet 5 mg   Level of Care:  Level of care: Progressive Family Communication: pending clinical improvement.   Disposition Plan:     Remains inpatient appropriate:  gi bleed   Procedures:  CT ANGIO  Consultants:   Vascular surgery Gastroenterology.   Antimicrobials:   Anti-infectives (From admission, onward)    None        Medications  Scheduled Meds:  amiodarone   200 mg Oral q AM   carvedilol   12.5 mg Oral BID   insulin  aspart  0-9 Units Subcutaneous TID WC   warfarin  5 mg Oral ONCE-1600   Warfarin - Pharmacist Dosing Inpatient   Does not apply q1600   Continuous Infusions:  heparin  1,000 Units/hr (08/11/24 1205)   PRN Meds:.acetaminophen  **OR** acetaminophen , ondansetron  **OR** ondansetron  (ZOFRAN ) IV    Subjective:   [name] [name] was seen and examined today.  No chest pain or sob, no nausea or vomiting. BM last night , dark, e was not sure if clots were there.   Objective:   Vitals:   08/10/24 2352 08/11/24 0403 08/11/24 0738 08/11/24 1400  BP: (!) 121/44 (!) 138/41 (!) 140/51 (!) 106/36  Pulse: 66 65 78 68  Resp: 20 18 17 17   Temp: 98.2 F (36.8 C) 98 F (36.7 C) 98 F (36.7 C) 97.7 F (36.5 C)  TempSrc: Oral Oral Oral Oral  SpO2: 96% 95% 97% 98%  Weight:      Height:       No intake or output data in the 24 hours ending 08/11/24 1434  Filed Weights   08/08/24 1835 08/09/24 0054  Weight: 73 kg 72.1 kg     Exam General exam: Appears calm and comfortable  Respiratory system: Clear to auscultation. Respiratory effort normal. Cardiovascular system: S1 & S2 heard, RRR.  Gastrointestinal system: Abdomen is nondistended, soft and nontender.  Central nervous system: Alert and oriented. No focal neurological deficits. Extremities: Symmetric 5 x 5 power. Skin: No rashes, lesions or ulcers Psychiatry: Judgement and insight appear normal. Mood & affect appropriate.      Data Reviewed:  I have personally reviewed following labs and imaging studies   CBC Lab Results   Component Value Date   WBC 7.5 08/09/2024   RBC 4.23 08/09/2024   HGB 12.1 (L) 08/11/2024   HCT 37.1 (L) 08/11/2024   MCV 95.0 08/09/2024   MCH 31.2 08/09/2024   PLT 214 08/09/2024   MCHC 32.8 08/09/2024   RDW 13.8 08/09/2024   LYMPHSABS 2.3 03/24/2023   MONOABS 1.2 (H) 03/24/2023   EOSABS 0.1 03/24/2023   BASOSABS 0.1 03/24/2023     Last metabolic panel Lab Results  Component Value Date   NA 141  08/09/2024   K 4.8 08/09/2024   CL 104 08/09/2024   CO2 24 08/09/2024   BUN 48 (H) 08/09/2024   CREATININE 1.67 (H) 08/09/2024   GLUCOSE 156 (H) 08/09/2024   GFRNONAA 42 (L) 08/09/2024   GFRAA 68 11/04/2020   CALCIUM  9.1 08/09/2024   PROT 8.7 (H) 08/08/2024   ALBUMIN  4.6 08/08/2024   BILITOT 0.7 08/08/2024   ALKPHOS 92 08/08/2024   AST 25 08/08/2024   ALT 18 08/08/2024   ANIONGAP 13 08/09/2024    CBG (last 3)  Recent Labs    08/10/24 2038 08/11/24 0809 08/11/24 1152  GLUCAP 231* 151* 230*      Coagulation Profile: Recent Labs  Lab 08/08/24 1857 08/11/24 0928  INR 2.8* 1.9*     Radiology Studies: No results found.      Elgie Butter M.D. [facility] 08/11/2024, 2:34 PM  Available via Epic secure chat 7am-7pm After 7 pm, please refer to night coverage provider listed on amion.

## 2024-08-11 NOTE — Progress Notes (Signed)
 Daily Progress Note  DOA: 08/08/2024 Hospital Day: 4  Cc: Lower gastrointestinal bleeding  ASSESSMENT    78 y.o. year old male with multiple medical problems admitted with painless lower GI bleeding on warfarin, suspect diverticular hemorrhage .  CT angiogram showed a small amount of acute hemorrhage at the anorectal junction on the right but volume up blood loss with coinciding drop in hemoglobin seems more consistent with a diverticular hemorrhage . TODAY: Normal bowel movement today, no blood in stool .  Hemoglobin 12.1  (up spontaneously from 10.6 overnight ).  On IV heparin .  Warfarin to be resumed today   Involuntary weight loss (mild) Unexplained weight loss of up approximately 10 pounds over the last several months due to diminished appetite.  The only new medication that he has started has been amiodarone .    History of adenomatous colon polyps History of post-polypectomy bleed at time of last colonoscopy in 2020 It was previously decided following his last colonoscopy there would be no further colonoscopies due to his age and and co-morbidities     Peripheral vascular disease Atrial Fibrillation Aortic stenosis s/p mechanical AVR on warfarin   HFrEF Transesophageal echo February 2025 EF 50 to 55%    CAD s/p CABG INR therapeutic at 2.8   COPD History of lung cancer s/p XRT ~ 3 yrs ago   CKD3a   History of CVA / TIA   PLAN   GI signing off.  Our office will contact him with a follow-up appointment  Subjective   Normal bowel movement today (no blood).  Would like to go home soon as he can.   Objective     Recent Labs    08/08/24 1857 08/09/24 0841 08/09/24 1105 08/11/24 0016 08/11/24 0622 08/11/24 1137  WBC 8.0 7.5  --   --   --   --   HGB 16.3 13.2   < > 11.4* 10.6* 12.1*  HCT 48.8 40.2   < > 34.6* 32.0* 37.1*  MCV 94.6 95.0  --   --   --   --   PLT 239 214  --   --   --   --    < > = values in this interval not displayed.   No results  for input(s): FOLATE, VITAMINB12, FERRITIN, TIBC, IRONPCTSAT in the last 72 hours. Recent Labs    08/08/24 1857 08/09/24 0841  NA 138 141  K 4.9 4.8  CL 100 104  CO2 24 24  GLUCOSE 198* 156*  BUN 49* 48*  CREATININE 1.59* 1.67*  CALCIUM  10.5* 9.1   Recent Labs    08/08/24 1857  PROT 8.7*  ALBUMIN  4.6  AST 25  ALT 18  ALKPHOS 92  BILITOT 0.7    Scheduled inpatient medications:   amiodarone   200 mg Oral q AM   atorvastatin   80 mg Oral Daily   carvedilol   12.5 mg Oral BID   [START ON 08/12/2024] empagliflozin   25 mg Oral Daily   furosemide   40 mg Oral Daily   glyBURIDE   5 mg Oral BID WC   insulin  aspart  0-9 Units Subcutaneous TID WC   sacubitril -valsartan   1 tablet Oral BID   spironolactone   25 mg Oral Daily   warfarin  5 mg Oral ONCE-1600   Warfarin - Pharmacist Dosing Inpatient   Does not apply q1600   Continuous inpatient infusions:   heparin  1,000 Units/hr (08/11/24 1205)   PRN inpatient medications: acetaminophen  **OR** acetaminophen , ondansetron  **OR** ondansetron  (  ZOFRAN ) IV  Vital signs in last 24 hours: Temp:  [97.6 F (36.4 C)-98.2 F (36.8 C)] 97.7 F (36.5 C) (09/05 1400) Pulse Rate:  [65-78] 68 (09/05 1400) Resp:  [17-20] 17 (09/05 1400) BP: (106-169)/(36-65) 106/36 (09/05 1400) SpO2:  [95 %-98 %] 98 % (09/05 1400) Last BM Date : 08/10/24 No intake or output data in the 24 hours ending 08/11/24 1622  Intake/Output from previous day: 09/04 0701 - 09/05 0700 In: 480 [P.O.:480] Out: 475 [Urine:475] Intake/Output this shift: No intake/output data recorded.   Physical Exam:  General: Alert male in NAD Heart:  Regular rate .  Pulmonary: Normal respiratory effort Abdomen: Soft, nondistended, nontender. Normal bowel sounds. Extremities: No lower extremity edema  Neurologic: Alert and oriented Psych: Pleasant. Cooperative     LOS: 0 days   Vina Dasen ,NP 08/11/2024, 4:22 PM

## 2024-08-11 NOTE — Evaluation (Signed)
 Physical Therapy Evaluation & Discharge Patient Details Name: [name] [name] MRN: [id number] DOB: August 19, 1946 Today's Date: 08/11/2024  History of Present Illness  Pt is a 78 y.o. male who presented 08/08/24 with bloody stool. Pt suspected to have diverticular hemorrhage. PMH: HTN, HLD, ITP, CAD s/p CABG and aortic stenosis s/p mechanical valve on chronic Coumadin  therapy, PVD, TIA, DM2, bil renal artery stenosis, COPD, CHF on chronic Lasix , lung cancer, PAD, CVA   Clinical Impression  Pt presents with condition. Pt appears and reports to be functioning at his baseline, not needing any AD or assistance and not displaying any LOB when ambulating or navigating stairs. All education completed and questions answered. No further PT services needed. Will sign off. Thank you for this referral.      If plan is discharge home, recommend the following:  (N/A)   Can travel by private vehicle        Equipment Recommendations None recommended by PT  Recommendations for Other Services       Functional Status Assessment Patient has not had a recent decline in their functional status     Precautions / Restrictions Precautions Precautions: None Restrictions Weight Bearing Restrictions Per Provider Order: No      Mobility  Bed Mobility               General bed mobility comments: Pt sitting up in chair upon arrival and at end of session.    Transfers Overall transfer level: Independent Equipment used: None               General transfer comment: No LOB or need for assistance to stand from recliner    Ambulation/Gait Ambulation/Gait assistance: Independent Gait Distance (Feet): 280 Feet Assistive device: None Gait Pattern/deviations: WFL(Within Functional Limits) Gait velocity: WFL     General Gait Details: No drastic gait deviations and no overt LOB  Stairs Stairs: Yes Stairs assistance: Modified independent (Device/Increase time) Stair Management: One rail  Right, One rail Left, Alternating pattern, Forwards Number of Stairs: 4 General stair comments: Ascends with R rail and descends with L rail to simulate home. No overt LOB  Wheelchair Mobility     Tilt Bed    Modified Rankin (Stroke Patients Only)       Balance Overall balance assessment: No apparent balance deficits (not formally assessed)                                           Pertinent Vitals/Pain Pain Assessment Pain Assessment: Faces Faces Pain Scale: No hurt Pain Intervention(s): Monitored during session    Home Living Family/patient expects to be discharged to:: Private residence Living Arrangements: Alone Available Help at Discharge: Neighbor;Available PRN/intermittently Type of Home: House Home Access: Level entry     Alternate Level Stairs-Number of Steps: flight Home Layout: Two level;Able to live on main level with bedroom/bathroom;Other (Comment) (cat's litter box is upstairs though) Home Equipment: None      Prior Function Prior Level of Function : Independent/Modified Independent;Driving             Mobility Comments: No AD       Extremity/Trunk Assessment   Upper Extremity Assessment Upper Extremity Assessment: Overall WFL for tasks assessed    Lower Extremity Assessment Lower Extremity Assessment: Overall WFL for tasks assessed    Cervical / Trunk Assessment Cervical / Trunk Assessment: Normal  Communication  Communication Communication: No apparent difficulties    Cognition Arousal: Alert Behavior During Therapy: WFL for tasks assessed/performed   PT - Cognitive impairments: No apparent impairments                         Following commands: Intact       Cueing Cueing Techniques: Verbal cues     General Comments      Exercises     Assessment/Plan    PT Assessment Patient does not need any further PT services  PT Problem List         PT Treatment Interventions      PT Goals  (Current goals can be found in the Care Plan section)  Acute Rehab PT Goals Patient Stated Goal: to go home PT Goal Formulation: All assessment and education complete, DC therapy Time For Goal Achievement: 08/12/24 Potential to Achieve Goals: Good    Frequency       Co-evaluation               AM-PAC PT 6 Clicks Mobility  Outcome Measure Help needed turning from your back to your side while in a flat bed without using bedrails?: None Help needed moving from lying on your back to sitting on the side of a flat bed without using bedrails?: None Help needed moving to and from a bed to a chair (including a wheelchair)?: None Help needed standing up from a chair using your arms (e.g., wheelchair or bedside chair)?: None Help needed to walk in hospital room?: None Help needed climbing 3-5 steps with a railing? : None 6 Click Score: 24    End of Session   Activity Tolerance: Patient tolerated treatment well Patient left: in chair;with call bell/phone within reach Nurse Communication: Other (comment) (notified MD of pt's request to speak with her about his anticoagulants) PT Visit Diagnosis: Other (comment) (MD referral for likely concern for weakness; diverticular hemorrhage)    Time: 8473-8462 PT Time Calculation (min) (ACUTE ONLY): 11 min   Charges:   PT Evaluation $PT Eval Low Complexity: 1 Low   PT General Charges $$ ACUTE PT VISIT: 1 Visit         Theo Ferretti, PT, DPT Acute Rehabilitation Services  Office: [phone]   Theo CHRISTELLA Ferretti 08/11/2024, 3:46 PM

## 2024-08-14 ENCOUNTER — Telehealth: Payer: Self-pay | Admitting: Cardiology

## 2024-08-14 NOTE — Telephone Encounter (Signed)
 I spoke to patient and rescheduled INR check for 9/11.  He was instructed to take extra 0.5 tablet today and resume Lovenox  injections.

## 2024-08-14 NOTE — Telephone Encounter (Signed)
 Pt was just released from hospital he stated and is requesting to speak with a coumadin  nurse to see what he should do next although pt has been informed of his upcoming coumadin  appt 9/16. Please advise

## 2024-08-17 ENCOUNTER — Ambulatory Visit: Attending: Cardiovascular Disease

## 2024-08-17 DIAGNOSIS — Z5181 Encounter for therapeutic drug level monitoring: Secondary | ICD-10-CM

## 2024-08-17 DIAGNOSIS — G459 Transient cerebral ischemic attack, unspecified: Secondary | ICD-10-CM

## 2024-08-17 DIAGNOSIS — I359 Nonrheumatic aortic valve disorder, unspecified: Secondary | ICD-10-CM

## 2024-08-17 LAB — POCT INR: INR: 4.1 — AB (ref 2.0–3.0)

## 2024-08-17 NOTE — Patient Instructions (Signed)
 Hold Friday and Saturday  then continue taking warfarin 1 tablet daily except for 1/2 a tablet on Mondays and Fridays. Stop Lovenox  Recheck INR in 2 weeks.  Coumadin  Clinic for any changes in medications or upcoming procedures.  Coumadin  Clinic [phone] *Per pt : Amio reduced to 100 mg daily May 2025 by HF clinic*

## 2024-08-17 NOTE — Progress Notes (Addendum)
 INR 4.1 Please see anticoagulation encounter Hold Friday and Saturday  then continue taking warfarin 1 tablet daily except for 1/2 a tablet on Mondays and Fridays. Stop Lovenox  Recheck INR in 2 weeks.  Coumadin  Clinic for any changes in medications or upcoming procedures.  Coumadin  Clinic [phone] *Per pt : Amio reduced to 100 mg daily May 2025 by HF clinic*

## 2024-08-19 ENCOUNTER — Other Ambulatory Visit (HOSPITAL_COMMUNITY): Payer: Self-pay | Admitting: Cardiology

## 2024-08-19 DIAGNOSIS — I5032 Chronic diastolic (congestive) heart failure: Secondary | ICD-10-CM

## 2024-08-21 DIAGNOSIS — Z23 Encounter for immunization: Secondary | ICD-10-CM | POA: Diagnosis not present

## 2024-08-21 DIAGNOSIS — D6869 Other thrombophilia: Secondary | ICD-10-CM | POA: Diagnosis not present

## 2024-08-21 DIAGNOSIS — I739 Peripheral vascular disease, unspecified: Secondary | ICD-10-CM | POA: Diagnosis not present

## 2024-08-21 DIAGNOSIS — Z8719 Personal history of other diseases of the digestive system: Secondary | ICD-10-CM | POA: Diagnosis not present

## 2024-08-21 DIAGNOSIS — Z7901 Long term (current) use of anticoagulants: Secondary | ICD-10-CM | POA: Diagnosis not present

## 2024-08-21 DIAGNOSIS — I4819 Other persistent atrial fibrillation: Secondary | ICD-10-CM | POA: Diagnosis not present

## 2024-08-21 DIAGNOSIS — I1310 Hypertensive heart and chronic kidney disease without heart failure, with stage 1 through stage 4 chronic kidney disease, or unspecified chronic kidney disease: Secondary | ICD-10-CM | POA: Diagnosis not present

## 2024-08-21 DIAGNOSIS — Z952 Presence of prosthetic heart valve: Secondary | ICD-10-CM | POA: Diagnosis not present

## 2024-08-21 DIAGNOSIS — C3492 Malignant neoplasm of unspecified part of left bronchus or lung: Secondary | ICD-10-CM | POA: Diagnosis not present

## 2024-08-21 DIAGNOSIS — I729 Aneurysm of unspecified site: Secondary | ICD-10-CM | POA: Diagnosis not present

## 2024-08-21 DIAGNOSIS — N1832 Chronic kidney disease, stage 3b: Secondary | ICD-10-CM | POA: Diagnosis not present

## 2024-08-21 DIAGNOSIS — I129 Hypertensive chronic kidney disease with stage 1 through stage 4 chronic kidney disease, or unspecified chronic kidney disease: Secondary | ICD-10-CM | POA: Diagnosis not present

## 2024-08-21 DIAGNOSIS — J849 Interstitial pulmonary disease, unspecified: Secondary | ICD-10-CM | POA: Diagnosis not present

## 2024-08-22 ENCOUNTER — Ambulatory Visit

## 2024-08-23 NOTE — Telephone Encounter (Signed)
 Appt scheduled

## 2024-08-28 ENCOUNTER — Other Ambulatory Visit: Payer: Self-pay

## 2024-08-28 DIAGNOSIS — I7409 Other arterial embolism and thrombosis of abdominal aorta: Secondary | ICD-10-CM

## 2024-08-31 ENCOUNTER — Ambulatory Visit: Attending: Cardiovascular Disease

## 2024-08-31 DIAGNOSIS — Z5181 Encounter for therapeutic drug level monitoring: Secondary | ICD-10-CM | POA: Diagnosis not present

## 2024-08-31 DIAGNOSIS — G459 Transient cerebral ischemic attack, unspecified: Secondary | ICD-10-CM

## 2024-08-31 DIAGNOSIS — I359 Nonrheumatic aortic valve disorder, unspecified: Secondary | ICD-10-CM | POA: Diagnosis not present

## 2024-08-31 LAB — POCT INR: INR: 2.3 (ref 2.0–3.0)

## 2024-08-31 NOTE — Progress Notes (Signed)
 INR 2.3 Please see anticoagulation encounter Take 1.5 tablets today only then  continue taking warfarin 1 tablet daily except for 1/2 a tablet on Mondays and Fridays. Recheck INR in 3 weeks.  Coumadin  Clinic for any changes in medications or upcoming procedures.  Coumadin  Clinic [phone] *Per pt : Amio reduced to 100 mg daily May 2025 by HF clinic*

## 2024-08-31 NOTE — Patient Instructions (Signed)
 Take 1.5 tablets today only then  continue taking warfarin 1 tablet daily except for 1/2 a tablet on Mondays and Fridays. Recheck INR in 3 weeks.  Coumadin  Clinic for any changes in medications or upcoming procedures.  Coumadin  Clinic [phone] *Per pt : Amio reduced to 100 mg daily May 2025 by HF clinic*

## 2024-09-07 ENCOUNTER — Telehealth (HOSPITAL_COMMUNITY): Payer: Self-pay | Admitting: Cardiology

## 2024-09-07 NOTE — Telephone Encounter (Signed)
 Called to confirm/remind patient of their appointment at [facility] on 09/07/24.   Appointment:   [x] Confirmed  [] Left mess   [] No answer/No voice mail  [] VM Full/unable to leave message  [] Phone not in service  Patient reminded to bring all medications and/or complete list.  Confirmed patient has transportation. Gave directions, instructed to utilize valet parking.

## 2024-09-08 ENCOUNTER — Ambulatory Visit (HOSPITAL_COMMUNITY)
Admission: RE | Admit: 2024-09-08 | Discharge: 2024-09-08 | Disposition: A | Discharge status: Home / Self Care | Source: Ambulatory Visit | Attending: Cardiology | Admitting: Cardiology

## 2024-09-08 ENCOUNTER — Ambulatory Visit (HOSPITAL_COMMUNITY): Payer: Self-pay | Admitting: Cardiology

## 2024-09-08 VITALS — HR 72 | Wt 173.0 lb

## 2024-09-08 DIAGNOSIS — I255 Ischemic cardiomyopathy: Secondary | ICD-10-CM | POA: Diagnosis not present

## 2024-09-08 DIAGNOSIS — I252 Old myocardial infarction: Secondary | ICD-10-CM | POA: Insufficient documentation

## 2024-09-08 DIAGNOSIS — Z951 Presence of aortocoronary bypass graft: Secondary | ICD-10-CM | POA: Insufficient documentation

## 2024-09-08 DIAGNOSIS — Z8673 Personal history of transient ischemic attack (TIA), and cerebral infarction without residual deficits: Secondary | ICD-10-CM | POA: Insufficient documentation

## 2024-09-08 DIAGNOSIS — Z7901 Long term (current) use of anticoagulants: Secondary | ICD-10-CM | POA: Insufficient documentation

## 2024-09-08 DIAGNOSIS — D693 Immune thrombocytopenic purpura: Secondary | ICD-10-CM | POA: Insufficient documentation

## 2024-09-08 DIAGNOSIS — Z952 Presence of prosthetic heart valve: Secondary | ICD-10-CM | POA: Insufficient documentation

## 2024-09-08 DIAGNOSIS — I5042 Chronic combined systolic (congestive) and diastolic (congestive) heart failure: Secondary | ICD-10-CM | POA: Diagnosis not present

## 2024-09-08 DIAGNOSIS — E785 Hyperlipidemia, unspecified: Secondary | ICD-10-CM | POA: Insufficient documentation

## 2024-09-08 DIAGNOSIS — Z79899 Other long term (current) drug therapy: Secondary | ICD-10-CM | POA: Diagnosis not present

## 2024-09-08 DIAGNOSIS — Z955 Presence of coronary angioplasty implant and graft: Secondary | ICD-10-CM | POA: Diagnosis not present

## 2024-09-08 DIAGNOSIS — I4892 Unspecified atrial flutter: Secondary | ICD-10-CM | POA: Diagnosis not present

## 2024-09-08 DIAGNOSIS — Z85118 Personal history of other malignant neoplasm of bronchus and lung: Secondary | ICD-10-CM | POA: Insufficient documentation

## 2024-09-08 DIAGNOSIS — I701 Atherosclerosis of renal artery: Secondary | ICD-10-CM | POA: Diagnosis not present

## 2024-09-08 DIAGNOSIS — I130 Hypertensive heart and chronic kidney disease with heart failure and stage 1 through stage 4 chronic kidney disease, or unspecified chronic kidney disease: Secondary | ICD-10-CM | POA: Diagnosis not present

## 2024-09-08 DIAGNOSIS — I480 Paroxysmal atrial fibrillation: Secondary | ICD-10-CM | POA: Diagnosis not present

## 2024-09-08 DIAGNOSIS — Z7984 Long term (current) use of oral hypoglycemic drugs: Secondary | ICD-10-CM | POA: Insufficient documentation

## 2024-09-08 DIAGNOSIS — N183 Chronic kidney disease, stage 3 unspecified: Secondary | ICD-10-CM | POA: Insufficient documentation

## 2024-09-08 DIAGNOSIS — I4821 Permanent atrial fibrillation: Secondary | ICD-10-CM | POA: Diagnosis not present

## 2024-09-08 DIAGNOSIS — I6521 Occlusion and stenosis of right carotid artery: Secondary | ICD-10-CM | POA: Insufficient documentation

## 2024-09-08 DIAGNOSIS — Z923 Personal history of irradiation: Secondary | ICD-10-CM | POA: Insufficient documentation

## 2024-09-08 DIAGNOSIS — I2510 Atherosclerotic heart disease of native coronary artery without angina pectoris: Secondary | ICD-10-CM | POA: Insufficient documentation

## 2024-09-08 DIAGNOSIS — I739 Peripheral vascular disease, unspecified: Secondary | ICD-10-CM | POA: Diagnosis not present

## 2024-09-08 DIAGNOSIS — I5032 Chronic diastolic (congestive) heart failure: Secondary | ICD-10-CM

## 2024-09-08 DIAGNOSIS — E1151 Type 2 diabetes mellitus with diabetic peripheral angiopathy without gangrene: Secondary | ICD-10-CM | POA: Insufficient documentation

## 2024-09-08 DIAGNOSIS — Z95820 Peripheral vascular angioplasty status with implants and grafts: Secondary | ICD-10-CM | POA: Insufficient documentation

## 2024-09-08 DIAGNOSIS — Z8719 Personal history of other diseases of the digestive system: Secondary | ICD-10-CM | POA: Insufficient documentation

## 2024-09-08 DIAGNOSIS — I771 Stricture of artery: Secondary | ICD-10-CM | POA: Diagnosis not present

## 2024-09-08 DIAGNOSIS — E1122 Type 2 diabetes mellitus with diabetic chronic kidney disease: Secondary | ICD-10-CM | POA: Insufficient documentation

## 2024-09-08 DIAGNOSIS — F17290 Nicotine dependence, other tobacco product, uncomplicated: Secondary | ICD-10-CM | POA: Diagnosis not present

## 2024-09-08 DIAGNOSIS — I5020 Unspecified systolic (congestive) heart failure: Secondary | ICD-10-CM | POA: Diagnosis not present

## 2024-09-08 DIAGNOSIS — I459 Conduction disorder, unspecified: Secondary | ICD-10-CM | POA: Insufficient documentation

## 2024-09-08 DIAGNOSIS — Z7982 Long term (current) use of aspirin: Secondary | ICD-10-CM | POA: Insufficient documentation

## 2024-09-08 LAB — LIPID PANEL
Cholesterol: 156 mg/dL (ref 0–200)
HDL: 46 mg/dL (ref >40)
LDL Cholesterol: 81 mg/dL (ref 0–99)
Total CHOL/HDL Ratio: 3.4 ratio
Triglycerides: 143 mg/dL (ref <150)
VLDL: 29 mg/dL (ref 0–40)

## 2024-09-08 LAB — COMPREHENSIVE METABOLIC PANEL WITH GFR
ALT: 22 U/L (ref 0–44)
AST: 25 U/L (ref 15–41)
Albumin: 3.4 g/dL — ABNORMAL LOW (ref 3.5–5.0)
Alkaline Phosphatase: 96 U/L (ref 38–126)
Anion gap: 7 (ref 5–15)
BUN: 22 mg/dL (ref 8–23)
CO2: 25 mmol/L (ref 22–32)
Calcium: 8.9 mg/dL (ref 8.9–10.3)
Chloride: 105 mmol/L (ref 98–111)
Creatinine, Ser: 1.66 mg/dL — ABNORMAL HIGH (ref 0.61–1.24)
GFR, Estimated: 42 mL/min — ABNORMAL LOW (ref >60)
Glucose, Bld: 140 mg/dL — ABNORMAL HIGH (ref 70–99)
Potassium: 4.3 mmol/L (ref 3.5–5.1)
Sodium: 137 mmol/L (ref 135–145)
Total Bilirubin: 0.9 mg/dL (ref 0.0–1.2)
Total Protein: 7.5 g/dL (ref 6.5–8.1)

## 2024-09-08 LAB — BRAIN NATRIURETIC PEPTIDE: B Natriuretic Peptide: 490.7 pg/mL — ABNORMAL HIGH (ref 0.0–100.0)

## 2024-09-08 LAB — TSH: TSH: 1.997 u[IU]/mL (ref 0.350–4.500)

## 2024-09-08 MED ORDER — CARVEDILOL 6.25 MG PO TABS: 6.2500 mg | ORAL_TABLET | Freq: Two times a day (BID) | ORAL | 3 refills | Status: AC

## 2024-09-08 MED ORDER — FUROSEMIDE 20 MG PO TABS: ORAL_TABLET | ORAL | 3 refills | Status: AC

## 2024-09-08 NOTE — Patient Instructions (Addendum)
 STOP Amiodarone    CHANGE Lasix  to 40 mg in the morning and 20 mg in the evening.  RESTART your Carvedilol  6.25 mg Twice daily  Labs done today, your results will be available in MyChart, we will contact you for abnormal readings.  Repeat blood work in 10 days.  Your physician has requested that you have an echocardiogram. Echocardiography is a painless test that uses sound waves to create images of your heart. It provides your doctor with information about the size and shape of your heart and how well your heart's chambers and valves are working. This procedure takes approximately one hour. There are no restrictions for this procedure. Please do NOT wear cologne, perfume, aftershave, or lotions (deodorant is allowed). Please arrive 15 minutes prior to your appointment time.  Please note: We ask at that you not bring children with you during ultrasound (echo/ vascular) testing. Due to room size and safety concerns, children are not allowed in the ultrasound rooms during exams. Our front office staff cannot provide observation of children in our lobby area while testing is being conducted. An adult accompanying a patient to their appointment will only be allowed in the ultrasound room at the discretion of the ultrasound technician under special circumstances. We apologize for any inconvenience.  Your physician recommends that you schedule a follow-up appointment in: 6 weeks.  If you have any questions or concerns before your next appointment please send us  a message through [place] or call our office at [phone].    TO LEAVE A MESSAGE FOR THE NURSE SELECT OPTION 2, PLEASE LEAVE A MESSAGE INCLUDING: YOUR NAME DATE OF BIRTH CALL BACK NUMBER REASON FOR CALL**this is important as we prioritize the call backs  YOU WILL RECEIVE A CALL BACK THE SAME DAY AS LONG AS YOU CALL BEFORE 4:00 PM  At the [facility], you and your health needs are our priority. As part of our  continuing mission to provide you with exceptional heart care, we have created designated Provider Care Teams. These Care Teams include your primary Cardiologist (physician) and Advanced Practice Providers (APPs- Physician Assistants and Nurse Practitioners) who all work together to provide you with the care you need, when you need it.   You may see any of the following providers on your designated Care Team at your next follow up: Dr Toribio Fuel Dr Ezra Shuck Dr. Ria Commander Dr. Morene Brownie Amy Lenetta, NP Caffie Shed, [place] [facility] Drain, [place] Beckey Coe, NP [place] Lee, NP Ellouise Class, NP Tinnie Redman, PharmD Jaun Bash, PharmD   Please be sure to bring in all your medications bottles to every appointment.    Thank you for choosing [facility]

## 2024-09-10 NOTE — Progress Notes (Signed)
 ADV[facility]ED [facility] CLINIC NOTE  PCP: Onita Rush, MD HF Cardiology: Dr. Rolan Reason for Visit: Heart Failure Follow-up  Chief complaint: CHF  HPI: [name] [name] is a 78 y.o. male with a history of CAD s/p CABG, mechanical AVR on coumadin , PAF, extensive PAD, DM, HTN, HLD, ITP, TIA, and recurrent GI bleed.   Long history of PAF with RVR S/P multiple DCCVs and failed AF ablation (12/21)  LHC in 10/20 showed 99% left main stenosis and patent LIMA to occluded LAD and a patent sequential vein to the PDA and PLA of an occluded dominant right. Medical therapy rec'd. Readmitted for CP s/p successful orbital atherectomy, PTCA, and DES to LM. LIMA-LAD patent. SVG-PDA patent. EF down to 30-35%, mechanical AoV ok. TEE 11/9 w/ improved EF, 40-45%.   In 3/21 s/p stenting to right SFA.    Echo 1/22 showed EF 60-65%, normal RV size and systolic function, stable mechanical aortic valve.   CT chest 1/22 diagnosed with squamous cell lung cancer.  He underwent radiation therapy.  He has had episodes concerning for vertebrobasilar TIAs.  Significant disease noted in the vertebral arteries.  He was seen by neurology who wanted him referred to interventional neuroradiology for angiography with possible PCI to the vertebrals. It does not appear that this ever occurred.   Echo in 1/24 showed EF 45-50%, basal to mid inferolateral hypokinesis, mildly decreased RV systolic function, mechanical aortic valve with mean gradient 6 mmHg.   Noted to be in Aflutter 1/25 in clinic and underwent unsuccessful TEE/ DCCV x 3 in 2/25 and continued on amiodarone . TEE showed EF 50-55%, no regional WMAs, nl RV, Trivial TR, peak RV-RA gradient 22 mmHg, Trivial mitral regurgitation, mechanical AoV ok.  Repeat DCCV on 02/21/24 also with post procedure EKG with atrial fibrillation.  S/p PTA and balloon angioplasty of L AT for gangrenous toes in 4/25.  CTA in 9/25 showed occluded SMA, bilateral renal artery stenosis, aneurysmal  dilation of proximal left profunda femoral artery.   He was admitted in 9/25 for GI bleeding.   He returns today for heart failure follow up. No pain in his feet/legs.  Walking for exercise, no dyspnea walking on flat ground.  He goes to the gym.  He has not gone back to playing golf yet.  Feels like his energy level is slowly decreasing.  No chest pain.  No orthopnea/PND.  Weight up 8 lbs.  He is in atrial flutter today but does not feel palpitations. He is off Coreg , not sure why.  No recent melena/BRBPR.   ECG (personally reviewed): atrial flutter rate 73  Labs (10/25): LDL 81, BNP 491, TSH normal, K 4.3, creatinine 1.66, LFTs normal   PMH  1. CAD:  S/P CABG (3/04) with LIMA-LAD, seq SVG-PDA/PLV.  - NSTEMI 10/20 with cath showing patent LIMA-LAD and SVG-PDA/PLV but 99% LM stenosis, occluded LAD, moderate ramus and high OM1 jeopardized, also 90% mid AV groove LCx stenosis.  Occluded RCA.  Patient ultimately had orbital atherectomy and stenting of left main with a 4.0 x 15 mm Resolute Onyx DES.  2. Mechanical aortic valve: TEE 11/20 showed stable-appearing mechanical valve, mean gradient 9 mmHg.  3. PAD: Aortobifemoral bypass in 2016 with follow up iliac stenting and femoral endarterectomy. In 2017, he had subsequent ostial and mid right SFA intervention. - Angiography 10/20 with 80% in-stent restenosis in proximal right SFA.  - Peripheral arterial dopplers (2/21) with 75-99% R SFA stenosis in prior stented area.  - 3/21 Right SFA  stent (Dr. Court).  - ABIs (1/22): Stable mild left SFA disease.  - Peripheral arterial dopplers (2/23): 50-99% proximal R SFA in-stent restenosis.  Medical management for now.  - Peripheral arterial dopplers (3/24): 50-99% proximal R SFA in-stent restenosis.  - S/p PTA and balloon angioplasty of L AT for gangrenous toes in 4/25. - CTA (9/25): occluded SMA, bilateral renal artery stenosis, aneurysmal dilation of proximal left profunda femoral artery.  4. Type 2  diabetes.  5. HTN - Renal artery dopplers (8/21): No significant stenosis.  6. Hyperlipidemia 7. H/o ITP  8. TIAs  9. Chronic Systolic HF: Ischemic cardiomyopathy.  - Echo (10/20): EF 30-35%, mechanical AoV ok.  - TEE (11/20): w/ improved EF, 40-45%. Mechanical aortic valve with mean gradient 9 mmHg, normal RV.  - Echo (6/21): EF 55-60%, mild LVH, mechanical AoV with mean gradient 9 mmHg.  - Echo (1/22): EF 60-65%, normal RV size and systolic function, stable mechanical aortic valve.  - Echo (1/24): EF 45-50%, basal to mid inferolateral hypokinesis, mildly decreased RV systolic function, mechanical aortic valve with mean gradient 6 mmHg. - TEE (2/25): EF 50-55%, normal RV, mechanical aortic valve with mean gradient 7 mmHg.  10. Atrial fibrillation/flutter: Now appears permanent - Atrial fibrillation ablation 12/21.  - DCCV 1/22 - Failed 2/25 and 3/25 DCCVs 11. Renal artery stenosis: Hx of left renal artery stenting in 2016 with repeat intervention on left for 75% in-stent restenosis, with progression of disease on the right renal artery showing 60% stenosis on duplex 07/12/19. 12. Carotid stenosis: 4/21 carotid dopplers with right subclavian stenosis, 40-59% RICA stenosis.  - Carotid dopplers (4/22): 1-39% BICA stenosis.  13. Squamous cell lung cancer: Treated with XRT.     ROS: All systems negative except as listed in HPI, PMH and Problem List.  Social History   Socioeconomic History   Marital status: Divorced    Spouse name: Not on file   Number of children: 3   Years of education: Not on file   Highest education level: Not on file  Occupational History   Occupation: Retired  Tobacco Use   Smoking status: Former    Current packs/day: 0.00    Average packs/day: 1.5 packs/day for 30.0 years (45.0 ttl pk-yrs)    Types: Cigarettes, Cigars    Start date: 12/16/1963    Quit date: 12/15/1993    Years since quitting: 30.7   Smokeless tobacco: Never   Tobacco comments:    occasional  cigar  Vaping Use   Vaping status: Never Used  Substance and Sexual Activity   Alcohol  use: Yes    Alcohol /week: 15.0 standard drinks of alcohol     Types: 1 Cans of beer, 7 Shots of liquor, 7 Standard drinks or equivalent per week    Comment: drinks 1 martini's a night (2 shots)   Drug use: No   Sexual activity: Yes  Other Topics Concern   Not on file  Social History Narrative   Tries to remain active.  Frequent golfer but claudication limits this.   1 child has passed   Social Drivers of Corporate investment banker Strain: Not on file  Food Insecurity: No Food Insecurity (08/09/2024)   Hunger Vital Sign    Worried About Running Out of Food in the Last Year: Never true    Ran Out of Food in the Last Year: Never true  Transportation Needs: No Transportation Needs (08/09/2024)   PRAPARE - Administrator, Civil Service (Medical): No  Lack of Transportation (Non-Medical): No  Physical Activity: Not on file  Stress: Not on file  Social Connections: Unknown (08/09/2024)   Social Connection and Isolation Panel    Frequency of Communication with Friends and Family: Once a week    Frequency of Social Gatherings with Friends and Family: Once a week    Attends Religious Services: Patient declined    Database administrator or Organizations: Patient declined    Attends Banker Meetings: Patient declined    Marital Status: Patient declined  Intimate Partner Violence: Not At Risk (08/09/2024)   Humiliation, Afraid, Rape, and Kick questionnaire    Fear of Current or Ex-Partner: No    Emotionally Abused: No    Physically Abused: No    Sexually Abused: No   Family History  Problem Relation Age of Onset   Coronary artery disease Mother        bypass surgery - deceased   Heart disease Father        murmur, valve replacement - deceased   Breast cancer Sister    Diabetes Other        grandmother   Diabetes Paternal Grandmother    Diabetes Paternal Aunt    Colon  cancer Neg Hx    Colon polyps Neg Hx    Esophageal cancer Neg Hx    Rectal cancer Neg Hx    Stomach cancer Neg Hx    Current Outpatient Medications  Medication Sig Dispense Refill   aspirin  EC 81 MG tablet Take 1 tablet (81 mg total) by mouth daily.     atorvastatin  (LIPITOR ) 80 MG tablet Take 80 mg by mouth in the morning.     Blood Glucose Monitoring Suppl (ONETOUCH VERIO IQ SYSTEM) w/Device KIT      Evolocumab 140 MG/ML SOAJ Inject 140 mg into the skin every 14 (fourteen) days.     fluorouracil (EFUDEX) 5 % cream Apply 1 Application topically daily as needed (dry skin).     glucose blood (ONETOUCH VERIO) test strip      glyBURIDE  (DIABETA ) 2.5 MG tablet Take 5 mg by mouth 2 (two) times daily with a meal.     JARDIANCE  25 MG TABS tablet Take 25 mg by mouth in the morning.     nitroGLYCERIN  (NITROSTAT ) 0.4 MG SL tablet Place 1 tablet (0.4 mg total) under the tongue every 5 (five) minutes x 3 doses as needed for chest pain. 25 tablet 12   OneTouch Delica Lancets 33G MISC      sacubitril -valsartan  (ENTRESTO ) 24-26 MG TAKE 1 TABLET BY MOUTH TWICE A DAY 180 tablet 3   spironolactone  (ALDACTONE ) 25 MG tablet TAKE 1 TABLET BY MOUTH EVERY DAY 90 tablet 3   warfarin (COUMADIN ) 5 MG tablet Take 1/2 tablet to 1 tablet by mouth daily as prescribed by the coumadin  clinic. 95 tablet 0   carvedilol  (COREG ) 6.25 MG tablet Take 1 tablet (6.25 mg total) by mouth 2 (two) times daily with a meal. 180 tablet 3   furosemide  (LASIX ) 20 MG tablet Take 2 tablets (40 mg total) by mouth every morning AND 1 tablet (20 mg total) every evening. 180 tablet 3   No current facility-administered medications for this encounter.   Pulse 72   Wt 78.5 kg (173 lb)   SpO2 96%   BMI 26.30 kg/m   Wt Readings from Last 3 Encounters:  09/08/24 78.5 kg (173 lb)  08/09/24 72.1 kg (158 lb 14.4 oz)  07/31/24 (P) 73.7 kg (162  lb 6 oz)   PHYSICAL EXAM: General: NAD Neck:JVP 8-9 cm, no thyromegaly or thyroid  nodule.  Lungs:  Clear to auscultation bilaterally with normal respiratory effort. CV: Nondisplaced PMI.  Heart irregular S1/S2 with mechanical S2, no S3/S4, 1/6 SEM RUSB.  No peripheral edema.  No carotid bruit.  Normal pedal pulses.  Abdomen: Soft, nontender, no hepatosplenomegaly, no distention.  Skin: Intact without lesions or rashes.  Neurologic: Alert and oriented x 3.  Psych: Normal affect. Extremities: No clubbing or cyanosis.  HEENT: Normal.   ASSESSMENT & PLAN: 1. Permanent Atrial Flutter/ Atrial Fibrillation: Previous multiple DCCVs for Afib and had Afib ablation in 2021.  Failed DCCV x3 attempts in 2/25, followed by second failed DCCV x3 in 3/25.  He did not want to try a repeat AF/AFL ablation.   - He can stop amiodarone  at this point.  Will check LFTs and TSH today.  - Restart Coreg  6.25 mg bid for rate control.  - Continue warfarin.  2. Chronic Heart Failure, HFrEF>>HFimpEF: Ischemic cardiomyopathy.  Echo 10/20 showed EF 30-35% and WMA, prior echo earlier in 10/20 showed EF 50-55%. Cath 10/20 showed jeopardized ramus and LCx territory (99% distal left main, occluded LAD, patent LIMA-LAD). Concerned that ischemia in this territory triggered CHF, worsening of LV function. Now s/p DES to left main, TEE in 11/20 after intervention showed EF higher at 40-45%.  Echo in 1/22 showed EF 60-65% with normal RV. Echo in 1/24 showed  EF 45-50%, basal to mid inferolateral hypokinesis, mildly decreased RV systolic function, mechanical aortic valve with mean gradient 6 mmHg. TEE 2/25 EF 50-55%, normal RV.  He looks mildly volume overloaded on exam, NYHA class II-III.   - Increase Lasix  to 40 qam/20 qpm.  BMET today stable, BMET again in 10 days.   - continue Entresto  24-26 mg bid - continue Jardiance  25 mg daily  - continue spiro 25 mg daily  - I am going to arrange for an echo.  3. CAD: Complicated disease, coronary angiography 10/20 showed patent SVG-RCA territory and patent LIMA-LAD.  The proximal LAD was  occluded and there was 99% distal left main stenosis.  This left the ramus and LCx in jeopardy.  He was not a good candidate for protected PCI => cannot place Impella with mechanical aortic valve and peripheral vascular disease likely precludes a femoral IABP.  It was decided to try to manage him medically. However, he returned with progressive chest pain episodes and NSTEMI, hs-TnI up to 935. S/p successful orbital atherectomy and stenting of the left main with a 4.0 x 15 mm Resolute Onyx DES 10/10/19.  No chest pain.  - Goal LDL < 55, continue Repatha and add Zetia  10 mg daily with repeat lipids in 2 months.  - He is on ASA with mechanical aortic valve.  4. S/p Mechanical AVR: Stable on echo 1/24 and TEE 2/25.  - continue warfarin + ASA 81 (atrial fibrillation + mechanical valve).  - Echo planned.  5. PAD:  Extensive, s/p aorto-bifemoral bypass, h/o iliac stenting, h/o femoral endarterectomy, PCI to right SFA in 2017.  Angiography in 10/20 with 80% ISR in right SFA.  He had PCI to right SFA in 3/21, no claudication now.  Repeat ABIs in 1/22 with mild left SFA disease. Peripheral arterial dopplers in 2/23 showed 50-99% in-stent restenosis in the right SFA stent, repeat dopplers in 3/24 were unchanged. Now s/p PTA and balloon angioplasty of L AT 4/25. Now with minimal claudication.  - Follows [facility].  6. Carotid/subclavian stenosis: Mild disease on dopplers in 4/22.  7. H/o GIB: Admission in 9/25 with GI bleeding, has had no overt bleeding since discharge.  8. CKD stage 3: Stable.   Follow up in 6 wks with APP.   I spent 31 minutes reviewing records, interviewing/examining patient, and managing orders.    Ezra Shuck, MD 09/10/2024

## 2024-09-12 ENCOUNTER — Encounter: Payer: Self-pay | Admitting: Pulmonary Disease

## 2024-09-12 ENCOUNTER — Ambulatory Visit (INDEPENDENT_AMBULATORY_CARE_PROVIDER_SITE_OTHER): Admitting: Pulmonary Disease

## 2024-09-12 VITALS — BP 158/51 | HR 59 | Temp 97.3°F | Ht 68.0 in | Wt 167.0 lb

## 2024-09-12 DIAGNOSIS — J849 Interstitial pulmonary disease, unspecified: Secondary | ICD-10-CM

## 2024-09-12 DIAGNOSIS — Z85118 Personal history of other malignant neoplasm of bronchus and lung: Secondary | ICD-10-CM | POA: Diagnosis not present

## 2024-09-12 DIAGNOSIS — I2510 Atherosclerotic heart disease of native coronary artery without angina pectoris: Secondary | ICD-10-CM | POA: Diagnosis not present

## 2024-09-12 NOTE — Patient Instructions (Signed)
  VISIT SUMMARY: You came in today for an evaluation of your interstitial lung disease. We discussed your history of early-stage squamous cell lung cancer and your recent symptoms of chest congestion. We also reviewed your past medical history, including your cardiac history and recent shingles infection.  YOUR PLAN: PROGRESSIVE INTERSTITIAL LUNG DISEASE: You have progressive interstitial lung disease with worsening scarring on imaging, now involving both lungs. The cause is unclear, but we are considering several possibilities including idiopathic pulmonary fibrosis, autoimmune disease, or hypersensitivity pneumonitis. -We will order a high-resolution CT scan of your lungs. -We will perform lung function tests. -We will obtain blood tests to evaluate for autoimmune disease and hypersensitivity pneumonitis. -We may consider antifibrotic medication depending on your test results.  SQUAMOUS CELL CARCINOMA OF LEFT LUNG, STATUS POST RADIATION: You have a history of Stage 1A squamous cell carcinoma of the left lung, treated with radiation in March 2020. There has been no recurrence of the cancer. -We will continue to monitor your lung condition, as the current progression of your lung disease suggests an alternative cause beyond the initial radiation therapy.

## 2024-09-12 NOTE — Progress Notes (Signed)
 [name] [name]    [id number]    Jan 26, 1946  Primary Care Physician:Russo, Norleen, MD  Referring Physician: Wyatt Leeroy HERO, PA-C [address] [place],  [place] [address]  Chief complaint: Consult for interstitial lung disease  HPI: 78 y.o. who  has a past medical history of Adenomatous colon polyp (09/1997), Anemia, Aortic stenosis, Blood transfusion, CHF (congestive heart failure) (HCC), COPD (chronic obstructive pulmonary disease) (HCC), Coronary artery disease, Diverticulitis of colon, Dysrhythmia, GERD (gastroesophageal reflux disease), Heart murmur, History of radiation therapy (02/18/2021, 02/20/2021, 02/25/2021), Hyperlipidemia, Hypertension, ITP (idiopathic thrombocytopenic purpura), Lung cancer (HCC), Peripheral arterial disease, Peripheral vascular disease, Renal artery stenosis, native, bilateral, Stroke (HCC) (2015), TIA (transient ischemic attack) (~ 2013), and Type II diabetes mellitus (HCC).  Discussed the use of AI scribe software for clinical note transcription with the patient, who gave verbal consent to proceed.  History of Present Illness [name] [name] is a 78 year old male with early-stage squamous cell lung cancer who presents for evaluation of interstitial lung disease. He was referred by his oncologist for evaluation of interstitial lung disease.  Pulmonary symptoms and interstitial lung disease evaluation - Persistent chest congestion for approximately one year - No shortness of breath - No cough - No exposure to mold, feather pillows, bird feathers, or asbestos - No family history of lung disease - Referred by oncologist for evaluation of interstitial lung disease  Lung cancer history - Early-stage squamous cell carcinoma of the left lung diagnosed in March 2020 - Treated with three radiation sessions - No recurrence of malignancy - No chemotherapy received - Bronchoscopy in 2022 confirmed stage 1A cancer  Cardiac history -  Aortic stenosis with mechanical valve replacement in 2004 - Chronic anticoagulation with Coumadin  - History of heart failure.  Recently saw Dr. Rolan, cardiology who increased Lasix  and ordered echocardiogram - Coronary artery bypass graft surgery five years ago  Recent infectious disease - Recent severe case of shingles - No joint pains, rash, or dysphagia   Relevant Pulmonary history: Pets: Cat Occupation:- Retired, previously worked as a Engineering geologist. Plays golf a couple of times a week Exposures: No mold, hot tub, Jacuzzi.  No feather pillow comforter No h/o chemo/XRT/amiodarone /macrodantin/MTX  No exposure to asbestos, silica or other organic allergens  Smoking history: 45-pack-year smoker.  Quit in 1995 Travel history: Originally from [place] .  Resides in [place]  for 32 years.  No significant recent travel Family history: No family history of lung disease  Outpatient Encounter Medications as of 09/12/2024  Medication Sig   aspirin  EC 81 MG tablet Take 1 tablet (81 mg total) by mouth daily.   atorvastatin  (LIPITOR ) 80 MG tablet Take 80 mg by mouth in the morning.   Blood Glucose Monitoring Suppl (ONETOUCH VERIO IQ SYSTEM) w/Device KIT    carvedilol  (COREG ) 6.25 MG tablet Take 1 tablet (6.25 mg total) by mouth 2 (two) times daily with a meal.   Evolocumab 140 MG/ML SOAJ Inject 140 mg into the skin every 14 (fourteen) days.   fluorouracil (EFUDEX) 5 % cream Apply 1 Application topically daily as needed (dry skin).   furosemide  (LASIX ) 20 MG tablet Take 2 tablets (40 mg total) by mouth every morning AND 1 tablet (20 mg total) every evening.   glucose blood (ONETOUCH VERIO) test strip    glyBURIDE  (DIABETA ) 2.5 MG tablet Take 5 mg by mouth 2 (two) times daily with a meal.   JARDIANCE  25 MG  TABS tablet Take 25 mg by mouth in the morning.   nitroGLYCERIN  (NITROSTAT ) 0.4 MG SL tablet Place 1 tablet (0.4 mg total) under the tongue every 5 (five)  minutes x 3 doses as needed for chest pain.   OneTouch Delica Lancets 33G MISC    sacubitril -valsartan  (ENTRESTO ) 24-26 MG TAKE 1 TABLET BY MOUTH TWICE A DAY   spironolactone  (ALDACTONE ) 25 MG tablet TAKE 1 TABLET BY MOUTH EVERY DAY   warfarin (COUMADIN ) 5 MG tablet Take 1/2 tablet to 1 tablet by mouth daily as prescribed by the coumadin  clinic.   No facility-administered encounter medications on file as of 09/12/2024.     Physical Exam: Today's Vitals   09/12/24 0958  BP: (!) 158/51  Pulse: (!) 59  Temp: (!) 97.3 F (36.3 C)  TempSrc: Oral  SpO2: 96%  Weight: 167 lb (75.8 kg)  Height: 5' 8 (1.727 m)   Body mass index is 25.39 kg/m.  Physical Exam GEN: No acute distress. CV: Regular rate and rhythm, no murmurs, mechanical click present. LUNGS: Crackles at lung bases, normal respiratory effort. SKIN JOINTS: Warm and dry, no rash.  Data Reviewed: Imaging: CT scan 07/26/2024-posttreatment changes of posterior lingula lesion with dense consolidation.  Extensive intralobular septal thickening, ground glass opacities bilaterally worsened on the right side.  Emphysema.  Bilateral bronchial wall thickening.  Cardiomegaly. I have reviewed the images personally.  PFTs:  Labs:  Assessment & Plan Progressive interstitial lung disease of unclear etiology Progressive interstitial lung disease with worsening scarring on imaging, now involving both lungs. Initial suspicion of radiation pneumonitis or fibrosis is unlikely due to progression beyond the radiation field and involvement of the right lung. Differential diagnosis includes idiopathic pulmonary fibrosis (IPF), autoimmune disease, or hypersensitivity pneumonitis. No history of relevant exposures or symptoms suggestive of autoimmune disease. Low suspicion for autoimmune or hypersensitivity causes, but further evaluation is necessary. - Order high-resolution CT scan of the lungs - Perform lung function tests - Obtain blood tests to  evaluate for autoimmune disease and hypersensitivity pneumonitis - Consider antifibrotic medication pending test results  Squamous cell carcinoma of left lung, status post radiation Stage 1A squamous cell carcinoma of the left lung, treated with radiation in March 2020. No recurrence. Radiation therapy may have contributed to initial lung changes, but current progression suggests an alternative etiology.  Extensive cardiac history with coronary artery disease, atrial fibrillation, flutter, mechanical aortic valve, chronic heart failure Recent elevated BNP noted.  Changes on CT may also represent pulmonary edema Lasix  recently increased by Dr. Rolan, cardiology. Repeat echocardiogram pending  Recommendations: ANA, rheumatoid factor, CCP CT, PFTs Continue increased dose of Lasix  Repeat echocardiogram per cardiology  I personally spent a total of 45 minutes in the care of the patient today including preparing to see the patient, getting/reviewing separately obtained history, performing a medically appropriate exam/evaluation, counseling and educating, placing orders, documenting clinical information in the EHR, and independently interpreting results.   Lonna Coder MD [facility] 09/12/2024, 10:20 AM  CC: Wyatt Leeroy HERO, PA-C

## 2024-09-14 LAB — ANA,IFA RA DIAG PNL W/RFLX TIT/PATN
Cyclic Citrullin Peptide Ab: <16 U
Rheumatoid fact SerPl-aCnc: 77 [IU]/mL — ABNORMAL HIGH (ref <14)
Rheumatoid fact SerPl-aCnc: <77 [IU]/mL — AB (ref <14)

## 2024-09-14 LAB — ANTI-NUCLEAR AB-TITER (ANA TITER): ANA Titer 1: 1:40 {titer} — ABNORMAL HIGH

## 2024-09-15 LAB — HYPERSENSITIVITY PNEUMONITIS
A. Pullulans Abs: NEGATIVE
A.Fumigatus #1 Abs: NEGATIVE
Micropolyspora faeni, IgG: NEGATIVE
Pigeon Serum Abs: NEGATIVE
Thermoact. Saccharii: NEGATIVE
Thermoactinomyces vulgaris, IgG: NEGATIVE

## 2024-09-18 MED ORDER — EZETIMIBE 10 MG PO TABS: 10.0000 mg | ORAL_TABLET | Freq: Every day | ORAL | 11 refills | Status: AC

## 2024-09-18 NOTE — Addendum Note (Signed)
 Addended by: Alquan Morrish, DALTON HERO on: 09/18/2024 12:44 PM   Modules accepted: Orders

## 2024-09-19 ENCOUNTER — Other Ambulatory Visit (HOSPITAL_COMMUNITY)

## 2024-09-20 ENCOUNTER — Ambulatory Visit (HOSPITAL_BASED_OUTPATIENT_CLINIC_OR_DEPARTMENT_OTHER)
Admission: RE | Admit: 2024-09-20 | Discharge: 2024-09-20 | Disposition: A | Discharge status: Home / Self Care | Source: Ambulatory Visit | Attending: Pulmonary Disease | Admitting: Pulmonary Disease

## 2024-09-20 DIAGNOSIS — J849 Interstitial pulmonary disease, unspecified: Secondary | ICD-10-CM | POA: Insufficient documentation

## 2024-09-20 DIAGNOSIS — R590 Localized enlarged lymph nodes: Secondary | ICD-10-CM | POA: Diagnosis not present

## 2024-09-20 DIAGNOSIS — R918 Other nonspecific abnormal finding of lung field: Secondary | ICD-10-CM | POA: Diagnosis not present

## 2024-09-20 DIAGNOSIS — J84112 Idiopathic pulmonary fibrosis: Secondary | ICD-10-CM | POA: Diagnosis not present

## 2024-09-21 ENCOUNTER — Ambulatory Visit: Attending: Cardiovascular Disease | Admitting: *Deleted

## 2024-09-21 DIAGNOSIS — G459 Transient cerebral ischemic attack, unspecified: Secondary | ICD-10-CM | POA: Diagnosis not present

## 2024-09-21 DIAGNOSIS — I359 Nonrheumatic aortic valve disorder, unspecified: Secondary | ICD-10-CM | POA: Diagnosis not present

## 2024-09-21 DIAGNOSIS — Z5181 Encounter for therapeutic drug level monitoring: Secondary | ICD-10-CM | POA: Diagnosis not present

## 2024-09-21 LAB — POCT INR: POC INR: 2.6

## 2024-09-21 NOTE — Progress Notes (Signed)
 Lab Results  Component Value Date   INR 2.6 09/21/2024   INR 2.3 08/31/2024   INR 4.1 (A) 08/17/2024   PROTIME 16.1 05/22/2009   PROTIME 23.6 05/08/2009    Description   INR 2.6; Continue taking warfarin 1 tablet daily except for 1/2 a tablet on Mondays and Fridays. Recheck INR in 4 weeks.  Coumadin  Clinic for any changes in medications or upcoming procedures.  Coumadin  Clinic [phone] *Per pt : Amio reduced to 100 mg daily May 2025 by HF clinic*

## 2024-09-21 NOTE — Patient Instructions (Signed)
 Description   INR 2.6; Continue taking warfarin 1 tablet daily except for 1/2 a tablet on Mondays and Fridays. Recheck INR in 4 weeks.  Coumadin  Clinic for any changes in medications or upcoming procedures.  Coumadin  Clinic [phone] *Per pt : Amio reduced to 100 mg daily May 2025 by HF clinic*

## 2024-09-22 ENCOUNTER — Ambulatory Visit (HOSPITAL_COMMUNITY)
Admission: RE | Admit: 2024-09-22 | Discharge: 2024-09-22 | Disposition: A | Source: Ambulatory Visit | Attending: Internal Medicine

## 2024-09-22 DIAGNOSIS — I5032 Chronic diastolic (congestive) heart failure: Secondary | ICD-10-CM | POA: Diagnosis not present

## 2024-09-22 LAB — BASIC METABOLIC PANEL WITH GFR
Anion gap: 10 (ref 5–15)
BUN: 46 mg/dL — ABNORMAL HIGH (ref 8–23)
CO2: 27 mmol/L (ref 22–32)
Calcium: 8.4 mg/dL — ABNORMAL LOW (ref 8.9–10.3)
Chloride: 101 mmol/L (ref 98–111)
Creatinine, Ser: 1.57 mg/dL — ABNORMAL HIGH (ref 0.61–1.24)
GFR, Estimated: 45 mL/min — ABNORMAL LOW (ref >60)
Glucose, Bld: 181 mg/dL — ABNORMAL HIGH (ref 70–99)
Potassium: 4.5 mmol/L (ref 3.5–5.1)
Sodium: 138 mmol/L (ref 135–145)

## 2024-09-25 ENCOUNTER — Ambulatory Visit (HOSPITAL_BASED_OUTPATIENT_CLINIC_OR_DEPARTMENT_OTHER)
Admission: RE | Admit: 2024-09-25 | Discharge: 2024-09-25 | Disposition: A | Discharge status: Home / Self Care | Source: Ambulatory Visit | Attending: Surgery | Admitting: Surgery

## 2024-09-25 DIAGNOSIS — I724 Aneurysm of artery of lower extremity: Secondary | ICD-10-CM | POA: Diagnosis not present

## 2024-09-25 DIAGNOSIS — I7409 Other arterial embolism and thrombosis of abdominal aorta: Secondary | ICD-10-CM | POA: Diagnosis not present

## 2024-09-25 DIAGNOSIS — T82856A Stenosis of peripheral vascular stent, initial encounter: Secondary | ICD-10-CM | POA: Diagnosis not present

## 2024-09-25 DIAGNOSIS — I701 Atherosclerosis of renal artery: Secondary | ICD-10-CM | POA: Diagnosis not present

## 2024-09-25 DIAGNOSIS — K5730 Diverticulosis of large intestine without perforation or abscess without bleeding: Secondary | ICD-10-CM | POA: Diagnosis not present

## 2024-09-25 MED ORDER — IOHEXOL 350 MG/ML SOLN
100.0000 mL | Freq: Once | INTRAVENOUS | Status: AC | PRN
  Administered 2024-09-25: 100 mL via INTRAVENOUS

## 2024-09-26 ENCOUNTER — Ambulatory Visit: Payer: Self-pay | Admitting: Pulmonary Disease

## 2024-09-26 DIAGNOSIS — J849 Interstitial pulmonary disease, unspecified: Secondary | ICD-10-CM

## 2024-09-28 ENCOUNTER — Encounter (INDEPENDENT_AMBULATORY_CARE_PROVIDER_SITE_OTHER): Payer: Self-pay

## 2024-09-28 DIAGNOSIS — H903 Sensorineural hearing loss, bilateral: Secondary | ICD-10-CM | POA: Diagnosis not present

## 2024-09-28 DIAGNOSIS — H6123 Impacted cerumen, bilateral: Secondary | ICD-10-CM | POA: Diagnosis not present

## 2024-09-29 NOTE — Progress Notes (Addendum)
 10/02/2024 Derrian Poli [id number] 1946-05-24  Referring provider: Onita Rush, MD Primary GI doctor: Dr. Suzann (Dr. Aneita)  ASSESSMENT AND PLAN:  LGIB requiring hospitalization while on Coumadin  History of post-polypectomy bleed at time of last colonoscopy in 2020 No plan for repeat endoscopic evaluation secondary to age 78/04/2024 CTA small acute hemorrhage anorectal junction no other evidence of bleeding Hgb dropped from 16-12.1 but stabilized without intervention No endoscopic evaluation pursued No further bleeding since the hospital Declines rectal exam likely diverticular versus hemorrhoidal No plan for endoscopy at this time  Epigastric pain 01/07/2022 EGD normal esophagus, small HH gastritis normal duodenum Negative H. Pylori  Abnormal liver imaging 08/08/2024 CT angio unremarkable liver no gallstones 09/20/2024 CT chest left hepatic lobe and caudate hypertrophy possible early to mild cirrhosis    Latest Ref Rng & Units 09/08/2024    4:10 PM 08/08/2024    6:57 PM 03/02/2024   10:31 AM  Hepatic Function  Total Protein 6.5 - 8.1 g/dL 7.5  8.7  6.9   Albumin  3.5 - 5.0 g/dL 3.4  4.6  3.4   AST 15 - 41 U/L 25  25  24    ALT 0 - 44 U/L 22  18  23    Alk Phosphatase 38 - 126 U/L 96  92  62   Total Bilirubin 0.0 - 1.2 mg/dL 0.9  0.7  0.9    Platelets 214  INR 09/21/2024 2.6  Will drink beer 1-2 every other day, no family history of liver issues.  CT chest indicated possible early cirrhosis with left hepatic lobe hypertrophy. Normal liver function tests, low albumin  possibly cardiac-related. No alcohol  or family liver history. Need further evaluation for cirrhosis. - Order elastography for liver stiffness. - Check liver function tests. - possible cardiac origin/contributing  History of adenomatous colon polyps History of post-polypectomy bleed at time of last colonoscopy in 2020 No plan for repeat endoscopic evaluation secondary to age  Aortic stenosis s/p  mechanical AVR history of CVA on warfarin  HFrEF CAD s/p CABG 01/18/2024 echo TEE EF 50 to 55% no thrombus no PFO mechanical aortic valve no regurgitation  COPD/interstitial lung disease possible, possible pulmonary arterial hypertension History of lung cancer s/p XRT ~ 3 yrs ago 09/20/2024 CT chest high-resolution ground glass septal thickening possible interstitial lung disease, follows with Dr. Theophilus Pending pulmonary function test   PAD 03/21/2024 peripheral vascular cath and angioplasty left AT   CKD3a BUN 46 Cr 1.57 GFR 45  Potassium 4.5    I have reviewed the clinic note as outlined by Alan Coombs, PA and agree with the assessment, plan and medical decision making.  Mr. Carver presents to the office today for follow-up of inpatient hospitalization for lower GI bleeding with painless bright red blood per rectum.  CT angiography showed a focus of bleeding at the anorectal junction as well as diverticulosis-differential diagnosis hemorrhoidal versus diverticular bleed.  Patient is on Coumadin .  Was managed conservatively in the hospital.  Had a post polypectomy bleed at the time of last colonoscopy in 2020.  Based upon age, medical comorbidities and stability further endoscopic evaluation deferred.  Will continue to monitor conservatively.  Incidentally, chest CT showed possible signs of cirrhosis of the liver.  LFTs normal.  Agree with ultrasound with elastography.  Inocente Suzann, MD   Patient Care Team: Onita Rush, MD as PCP - General (Internal Medicine)  HISTORY OF PRESENT ILLNESS: 78 y.o. male with a past medical history listed below presents for evaluation  of hospital follow up.   Seen by our inpatient team 09/03-09/05, painless BRB bleeding, had 7 more episodes in the ER but then it stabilized without intervention. CTA showed bleeding an anorectal junction.   Discussed the use of AI scribe software for clinical note transcription with the patient, who gave verbal  consent to proceed.  History of Present Illness   Mikie Misner is a 78 year old male who presents for follow-up after a recent hospitalization for rectal bleeding.  He experienced a significant episode of rectal bleeding, described as a 'bowl full of blood' with bright red coloration, leading to an emergency room visit and subsequent hospitalization. There were multiple episodes of bleeding, totaling seven times in the emergency room, which eventually ceased after a couple of days. A CT angiography on September 2nd revealed a small amount of acute hemorrhage at the anorectal junction on the right and diverticulosis. His hemoglobin dropped from a baseline of 16 to 12 during the hospital stay but has remained stable since, with no recurrence of bleeding.  He denies any history of hemorrhoids, rectal pain, burning, itching, or discomfort. No dizziness, sweating, or unusual sensations were noted during the bleeding episodes. He has a history of alternating constipation and diarrhea, predominantly experiencing constipation, and does not use fiber supplements or medications to manage these symptoms.  He is on Coumadin , with a therapeutic INR of 2.8 at the time of the bleeding episode. He has a history of shortness of breath and heart failure issues but denies any chest discomfort. He consumes alcohol  occasionally, about one beer every other day.  A CT chest performed on October 15th showed left hepatic lobe hypertrophy; his liver function tests have been reported as normal in prior workups. He denies any family history of liver issues.        He  reports that he quit smoking about 30 years ago. His smoking use included cigarettes and cigars. He started smoking about 60 years ago. He has a 45 pack-year smoking history. He has never used smokeless tobacco. He reports current alcohol  use of about 15.0 standard drinks of alcohol  per week. He reports that he does not use drugs.  RELEVANT GI  HISTORY, IMAGING AND LABS: Results   LABS Hb: 12  RADIOLOGY CT angiography: Small amount of acute hemorrhage at the anorectal junction on the right; diverticulosis (08/08/2024) CT chest: Left hepatic lobe hypertrophy, possible early to mild cirrhosis; unremarkable lungs (09/20/2024)      CBC    Component Value Date/Time   WBC 7.5 08/09/2024 0841   RBC 4.23 08/09/2024 0841   HGB 12.1 (L) 08/11/2024 1137   HGB 16.4 03/21/2024 1320   HCT 37.1 (L) 08/11/2024 1137   HCT 50.4 03/21/2024 1320   PLT 214 08/09/2024 0841   PLT 216 03/21/2024 1320   MCV 95.0 08/09/2024 0841   MCV 96 03/21/2024 1320   MCH 31.2 08/09/2024 0841   MCHC 32.8 08/09/2024 0841   RDW 13.8 08/09/2024 0841   RDW 14.3 03/21/2024 1320   LYMPHSABS 2.3 03/24/2023 1421   MONOABS 1.2 (H) 03/24/2023 1421   EOSABS 0.1 03/24/2023 1421   BASOSABS 0.1 03/24/2023 1421   Recent Labs    08/09/24 0841 08/09/24 1105 08/09/24 1838 08/09/24 2354 08/10/24 0716 08/10/24 1228 08/10/24 1827 08/11/24 0016 08/11/24 0622 08/11/24 1137  HGB 13.2 13.9 12.8* 11.5* 11.4* 11.4* 11.5* 11.4* 10.6* 12.1*    CMP     Component Value Date/Time   NA 138 09/22/2024 1020  NA 138 03/21/2024 1320   K 4.5 09/22/2024 1020   CL 101 09/22/2024 1020   CO2 27 09/22/2024 1020   GLUCOSE 181 (H) 09/22/2024 1020   BUN 46 (H) 09/22/2024 1020   BUN 42 (H) 03/21/2024 1320   CREATININE 1.57 (H) 09/22/2024 1020   CREATININE 0.74 09/28/2016 1146   CALCIUM  8.4 (L) 09/22/2024 1020   PROT 7.5 09/08/2024 1610   PROT 6.9 09/30/2020 1606   ALBUMIN  3.4 (L) 09/08/2024 1610   ALBUMIN  4.3 09/30/2020 1606   AST 25 09/08/2024 1610   ALT 22 09/08/2024 1610   ALKPHOS 96 09/08/2024 1610   BILITOT 0.9 09/08/2024 1610   BILITOT 0.4 09/30/2020 1606   GFRNONAA 45 (L) 09/22/2024 1020   GFRAA 68 11/04/2020 1529      Latest Ref Rng & Units 09/08/2024    4:10 PM 08/08/2024    6:57 PM 03/02/2024   10:31 AM  Hepatic Function  Total Protein 6.5 - 8.1 g/dL 7.5   8.7  6.9   Albumin  3.5 - 5.0 g/dL 3.4  4.6  3.4   AST 15 - 41 U/L 25  25  24    ALT 0 - 44 U/L 22  18  23    Alk Phosphatase 38 - 126 U/L 96  92  62   Total Bilirubin 0.0 - 1.2 mg/dL 0.9  0.7  0.9       Current Medications:   Current Outpatient Medications (Endocrine & Metabolic):    glyBURIDE  (DIABETA ) 2.5 MG tablet, Take 5 mg by mouth 2 (two) times daily with a meal.   JARDIANCE  25 MG TABS tablet, Take 25 mg by mouth in the morning.  Current Outpatient Medications (Cardiovascular):    atorvastatin  (LIPITOR ) 80 MG tablet, Take 80 mg by mouth in the morning.   carvedilol  (COREG ) 6.25 MG tablet, Take 1 tablet (6.25 mg total) by mouth 2 (two) times daily with a meal.   Evolocumab 140 MG/ML SOAJ, Inject 140 mg into the skin every 14 (fourteen) days.   ezetimibe  (ZETIA ) 10 MG tablet, Take 1 tablet (10 mg total) by mouth daily.   furosemide  (LASIX ) 20 MG tablet, Take 2 tablets (40 mg total) by mouth every morning AND 1 tablet (20 mg total) every evening.   nitroGLYCERIN  (NITROSTAT ) 0.4 MG SL tablet, Place 1 tablet (0.4 mg total) under the tongue every 5 (five) minutes x 3 doses as needed for chest pain.   sacubitril -valsartan  (ENTRESTO ) 24-26 MG, TAKE 1 TABLET BY MOUTH TWICE A DAY   spironolactone  (ALDACTONE ) 25 MG tablet, TAKE 1 TABLET BY MOUTH EVERY DAY   Current Outpatient Medications (Analgesics):    aspirin  EC 81 MG tablet, Take 1 tablet (81 mg total) by mouth daily.  Current Outpatient Medications (Hematological):    warfarin (COUMADIN ) 5 MG tablet, Take 1/2 tablet to 1 tablet by mouth daily as prescribed by the coumadin  clinic.  Current Outpatient Medications (Other):    Blood Glucose Monitoring Suppl (ONETOUCH VERIO IQ SYSTEM) w/Device KIT,    fluorouracil (EFUDEX) 5 % cream, Apply 1 Application topically daily as needed (dry skin).   glucose blood (ONETOUCH VERIO) test strip,    OneTouch Delica Lancets 33G MISC,   Medical History:  Past Medical History:  Diagnosis Date    Adenomatous colon polyp 09/1997   Anemia    Aortic stenosis    s/p [facility] mechanical AVR - Chronic Coumadin    Blood transfusion    related to ITP   CHF (congestive heart failure) (HCC)  COPD (chronic obstructive pulmonary disease) (HCC)    patient denies this dx on 01/27/21   Coronary artery disease    s/p cabg x 3 11/2003: lima-lad, seq vg to rpda and rpl   Diverticulitis of colon    Dysrhythmia    a-fib   GERD (gastroesophageal reflux disease)    patient states he has never had reflux   Heart murmur    History of radiation therapy 02/18/2021, 02/20/2021, 02/25/2021   SBRT to left lung     Dr Lynwood Nasuti   Hyperlipidemia    Hypertension    ITP (idiopathic thrombocytopenic purpura)    Lung cancer (HCC)    last week   Peripheral arterial disease    a. history of aortobifemoral bypass grafting by Dr. Krystal early b. LE angiography 04/22/2015 patent aortobifem graft, DES to R SFA   Peripheral vascular disease    s/p Left external Iliac Artery stenting and subsequent left femoral endarterectomy 02/2011- post op course complicated by wound infxn req I&D 03/2011   Renal artery stenosis, native, bilateral    a. bilateral renal artery stenosis by recent duplex ultrasound b. L renal artery stent 02/2015, R renal artery patent on angiogram   Stroke (HCC) 2015   TIA (transient ischemic attack) ~ 2013   Type II diabetes mellitus (HCC)    Allergies: No Known Allergies   Surgical History:  He  has a past surgical history that includes Splenectomy (02/2003); Aortic valve replacement (~ 2004); Angioplasty / stenting iliac; Tonsillectomy (~ 1952); Cardiac valve replacement (11/2003); Aorta - bilateral femoral artery bypass graft (01/18/2012); abdominal aortagram (N/A, 12/16/2011); lower extremity angiogram (N/A, 02/21/2015); renal angiogram (N/A, 02/21/2015); Renal artery stent (Left, 04/22/2015); Cardiac catheterization (N/A, 04/22/2015); Cardiac catheterization (N/A, 08/24/2016); Cardiac catheterization  (Right, 10/01/2016); Cataract extraction w/ intraocular lens  implant, bilateral (Bilateral); Coronary artery bypass graft (11/2003); Cardiac catheterization (11/2003); Esophagogastroduodenoscopy (egd) with propofol  (N/A, 02/27/2019); Colonoscopy; Polypectomy; Colonoscopy with propofol  (N/A, 09/03/2019); Hemostasis clip placement (09/03/2019); CORONARY/GRAFT ANGIOGRAPHY (N/A, 09/18/2019); CORONARY ATHERECTOMY (N/A, 10/10/2019); TEMPORARY PACEMAKER (N/A, 10/10/2019); CORONARY STENT INTERVENTION (N/A, 10/10/2019); TEE without cardioversion (N/A, 10/16/2019); Cardioversion (N/A, 10/16/2019); ABDOMINAL AORTOGRAM W/LOWER EXTREMITY (Right, 02/29/2020); PERIPHERAL VASCULAR INTERVENTION (02/29/2020); ATRIAL FIBRILLATION ABLATION (N/A, 11/20/2020); Cardioversion (N/A, 01/01/2021); Video bronchoscopy with endobronchial navigation (N/A, 01/28/2021); Bronchial biopsy (01/28/2021); Bronchial brushings (01/28/2021); Bronchial needle aspiration biopsy (01/28/2021); Fiducial marker placement (01/28/2021); Bronchial washings (01/28/2021); Video bronchoscopy (Left, 03/19/2023); Bronchial washings (03/19/2023); TRANSESOPHAGEAL ECHOCARDIOGRAM (N/A, 01/18/2024); CARDIOVERSION (N/A, 01/18/2024); CARDIOVERSION (N/A, 02/21/2024); Lower Extremity Angiography (03/24/2024); and LOWER EXTREMITY INTERVENTION (03/24/2024). Family History:  His family history includes Breast cancer in his sister; Coronary artery disease in his mother; Diabetes in his paternal aunt, paternal grandmother, and another family member; Heart disease in his father.  REVIEW OF SYSTEMS  : All other systems reviewed and negative except where noted in the History of Present Illness.  PHYSICAL EXAM: BP 124/60   Pulse 77   Ht 5' 8 (1.727 m)   Wt 169 lb 4 oz (76.8 kg)   BMI 25.73 kg/m  Physical Exam   GENERAL APPEARANCE: Well nourished, in no apparent distress. HEENT: No cervical lymphadenopathy, unremarkable thyroid , sclerae anicteric, conjunctiva pink. RESPIRATORY: Respiratory  effort normal, breath sounds equal bilaterally without rales, rhonchi, or wheezing. CARDIO: Irregular mechanical click on auscultation, peripheral pulses intact. ABDOMEN: Soft, non-distended, active bowel sounds in all four quadrants, no tenderness to palpation, no rebound, no mass appreciated. RECTAL: Declines. MUSCULOSKELETAL: Full range of motion, normal gait, without edema. SKIN: Dry, intact without rashes or lesions.  No jaundice. NEURO: Alert, oriented, no focal deficits. PSYCH: Cooperative, normal mood and affect.      Alan JONELLE Coombs, PA-C 11:38 AM

## 2024-10-02 ENCOUNTER — Ambulatory Visit: Attending: Surgery | Admitting: Surgery

## 2024-10-02 ENCOUNTER — Encounter: Payer: Self-pay | Admitting: Physician Assistant

## 2024-10-02 ENCOUNTER — Ambulatory Visit: Admitting: Physician Assistant

## 2024-10-02 ENCOUNTER — Other Ambulatory Visit (INDEPENDENT_AMBULATORY_CARE_PROVIDER_SITE_OTHER)

## 2024-10-02 ENCOUNTER — Encounter: Payer: Self-pay | Admitting: Surgery

## 2024-10-02 VITALS — BP 124/60 | HR 77 | Ht 68.0 in | Wt 169.2 lb

## 2024-10-02 VITALS — BP 148/58 | HR 80 | Temp 98.3°F | Ht 68.0 in | Wt 168.0 lb

## 2024-10-02 DIAGNOSIS — R7989 Other specified abnormal findings of blood chemistry: Secondary | ICD-10-CM

## 2024-10-02 DIAGNOSIS — I739 Peripheral vascular disease, unspecified: Secondary | ICD-10-CM | POA: Diagnosis not present

## 2024-10-02 DIAGNOSIS — I724 Aneurysm of artery of lower extremity: Secondary | ICD-10-CM

## 2024-10-02 DIAGNOSIS — I5033 Acute on chronic diastolic (congestive) heart failure: Secondary | ICD-10-CM | POA: Diagnosis not present

## 2024-10-02 DIAGNOSIS — J449 Chronic obstructive pulmonary disease, unspecified: Secondary | ICD-10-CM

## 2024-10-02 DIAGNOSIS — I6523 Occlusion and stenosis of bilateral carotid arteries: Secondary | ICD-10-CM

## 2024-10-02 DIAGNOSIS — R932 Abnormal findings on diagnostic imaging of liver and biliary tract: Secondary | ICD-10-CM

## 2024-10-02 DIAGNOSIS — I25810 Atherosclerosis of coronary artery bypass graft(s) without angina pectoris: Secondary | ICD-10-CM | POA: Diagnosis not present

## 2024-10-02 DIAGNOSIS — Z952 Presence of prosthetic heart valve: Secondary | ICD-10-CM

## 2024-10-02 DIAGNOSIS — K625 Hemorrhage of anus and rectum: Secondary | ICD-10-CM

## 2024-10-02 LAB — CBC WITH DIFFERENTIAL/PLATELET
Basophils Absolute: 0.1 K/uL (ref 0.0–0.1)
Basophils Relative: 1.2 % (ref 0.0–3.0)
Eosinophils Absolute: 0.3 K/uL (ref 0.0–0.7)
Eosinophils Relative: 3.6 % (ref 0.0–5.0)
HCT: 40.1 % (ref 39.0–52.0)
Hemoglobin: 12.8 g/dL — ABNORMAL LOW (ref 13.0–17.0)
Lymphocytes Relative: 19.3 % (ref 12.0–46.0)
Lymphs Abs: 1.4 K/uL (ref 0.7–4.0)
MCHC: 32 g/dL (ref 30.0–36.0)
MCV: 87.2 fl (ref 78.0–100.0)
Monocytes Absolute: 0.9 K/uL (ref 0.1–1.0)
Monocytes Relative: 12.8 % — ABNORMAL HIGH (ref 3.0–12.0)
Neutro Abs: 4.7 K/uL (ref 1.4–7.7)
Neutrophils Relative %: 63.1 % (ref 43.0–77.0)
Platelets: 270 K/uL (ref 150.0–400.0)
RBC: 4.6 Mil/uL (ref 4.22–5.81)
RDW: 16.4 % — ABNORMAL HIGH (ref 11.5–15.5)
WBC: 7.4 K/uL (ref 4.0–10.5)

## 2024-10-02 LAB — BASIC METABOLIC PANEL WITH GFR
BUN: 44 mg/dL — ABNORMAL HIGH (ref 6–23)
CO2: 32 meq/L (ref 19–32)
Calcium: 9.1 mg/dL (ref 8.4–10.5)
Chloride: 98 meq/L (ref 96–112)
Creatinine, Ser: 2.17 mg/dL — ABNORMAL HIGH (ref 0.40–1.50)
GFR: 28.34 mL/min — ABNORMAL LOW (ref >60.00)
Glucose, Bld: 229 mg/dL — ABNORMAL HIGH (ref 70–99)
Potassium: 5 meq/L (ref 3.5–5.1)
Sodium: 138 meq/L (ref 135–145)

## 2024-10-02 LAB — IBC + FERRITIN
Ferritin: 49.2 ng/mL (ref 22.0–322.0)
Iron: 56 ug/dL (ref 42–165)
Saturation Ratios: 11.9 % — ABNORMAL LOW (ref 20.0–50.0)
TIBC: 469 ug/dL — ABNORMAL HIGH (ref 250.0–450.0)
Transferrin: 335 mg/dL (ref 212.0–360.0)

## 2024-10-02 LAB — HEPATIC FUNCTION PANEL
ALT: 18 U/L (ref 0–53)
AST: 17 U/L (ref 0–37)
Albumin: 4.1 g/dL (ref 3.5–5.2)
Alkaline Phosphatase: 71 U/L (ref 39–117)
Bilirubin, Direct: 0.2 mg/dL (ref 0.0–0.3)
Total Bilirubin: 0.7 mg/dL (ref 0.2–1.2)
Total Protein: 7.2 g/dL (ref 6.0–8.3)

## 2024-10-02 LAB — GAMMA GT: GGT: 27 U/L (ref 7–51)

## 2024-10-02 NOTE — Progress Notes (Signed)
 [facility] [place]  Patient name: [name] [name] MRN: [id number] DOB: December 28, 1945 Sex: male   REASON FOR VISIT:    Follow up  HISOTRY OF PRESENT ILLNESS:    Jakim Drapeau is a 78 y.o. male who has undergone the following procedures:  03/05/2011: Left iliofemoral endarterectomy ([facility]) 03/14/2011: Left groin exploration and I&D ([facility]) 01/18/2012: Aortobifemoral bypass graft (Early)  The patient has a history of coronary artery disease, status post CABG.  He has mechanical aortic valve on Coumadin .  He also has paroxysmal atrial fibrillation.  He is a diabetic.  He is medically managed for hypertension.  He has a history of lung cancer status post SBRT and fibrotic lung changes.  He has stage III chronic renal disease.  He was recently in the hospital for a GI bleed which was isolated to the anorectal junction.  He has not had any further bleeding  He denies any claudication symptoms.  He was referred to neurology for possible TIA-like symptoms 2 years ago but this was not felt to be symptomatic carotid disease PAST MEDICAL HISTORY:   Past Medical History:  Diagnosis Date   Adenomatous colon polyp 09/1997   Anemia    Aortic stenosis    s/p [facility] mechanical AVR - Chronic Coumadin    Blood transfusion    related to ITP   CHF (congestive heart failure) (HCC)    COPD (chronic obstructive pulmonary disease) (HCC)    patient denies this dx on 01/27/21   Coronary artery disease    s/p cabg x 3 11/2003: lima-lad, seq vg to rpda and rpl   Diverticulitis of colon    Dysrhythmia    a-fib   GERD (gastroesophageal reflux disease)    patient states he has never had reflux   Heart murmur    History of radiation therapy 02/18/2021, 02/20/2021, 02/25/2021   SBRT to left lung     Dr Lynwood Nasuti   Hyperlipidemia    Hypertension    ITP (idiopathic thrombocytopenic purpura)    Lung cancer (HCC)    last week    Peripheral arterial disease    a. history of aortobifemoral bypass grafting by Dr. Krystal early b. LE angiography 04/22/2015 patent aortobifem graft, DES to R SFA   Peripheral vascular disease    s/p Left external Iliac Artery stenting and subsequent left femoral endarterectomy 02/2011- post op course complicated by wound infxn req I&D 03/2011   Renal artery stenosis, native, bilateral    a. bilateral renal artery stenosis by recent duplex ultrasound b. L renal artery stent 02/2015, R renal artery patent on angiogram   Stroke (HCC) 2015   TIA (transient ischemic attack) ~ 2013   Type II diabetes mellitus (HCC)      FAMILY HISTORY:   Family History  Problem Relation Age of Onset   Coronary artery disease Mother        bypass surgery - deceased   Heart disease Father        murmur, valve replacement - deceased   Breast cancer Sister    Diabetes Other        grandmother   Diabetes Paternal Grandmother    Diabetes Paternal Aunt    Colon cancer Neg Hx    Colon polyps Neg Hx    Esophageal cancer Neg Hx    Rectal cancer Neg Hx    Stomach cancer Neg Hx     SOCIAL HISTORY:   Social History   Tobacco Use  Smoking status: Former    Current packs/day: 0.00    Average packs/day: 1.5 packs/day for 30.0 years (45.0 ttl pk-yrs)    Types: Cigarettes, Cigars    Start date: 12/16/1963    Quit date: 12/15/1993    Years since quitting: 30.8   Smokeless tobacco: Never   Tobacco comments:    Started smoking at age 89, quit 21. Stopped smoking cigars 5 years ago. 09/12/2024.  Substance Use Topics   Alcohol  use: Yes    Alcohol /week: 15.0 standard drinks of alcohol     Types: 1 Cans of beer, 7 Shots of liquor, 7 Standard drinks or equivalent per week    Comment: drinks 1 martini's a night (2 shots)     ALLERGIES:   No Known Allergies   CURRENT MEDICATIONS:   Current Outpatient Medications  Medication Sig Dispense Refill   aspirin  EC 81 MG tablet Take 1 tablet (81 mg total) by mouth  daily.     atorvastatin  (LIPITOR ) 80 MG tablet Take 80 mg by mouth in the morning.     Blood Glucose Monitoring Suppl (ONETOUCH VERIO IQ SYSTEM) w/Device KIT      carvedilol  (COREG ) 6.25 MG tablet Take 1 tablet (6.25 mg total) by mouth 2 (two) times daily with a meal. 180 tablet 3   Evolocumab 140 MG/ML SOAJ Inject 140 mg into the skin every 14 (fourteen) days.     ezetimibe  (ZETIA ) 10 MG tablet Take 1 tablet (10 mg total) by mouth daily. 30 tablet 11   fluorouracil (EFUDEX) 5 % cream Apply 1 Application topically daily as needed (dry skin).     furosemide  (LASIX ) 20 MG tablet Take 2 tablets (40 mg total) by mouth every morning AND 1 tablet (20 mg total) every evening. 180 tablet 3   glucose blood (ONETOUCH VERIO) test strip      glyBURIDE  (DIABETA ) 2.5 MG tablet Take 5 mg by mouth 2 (two) times daily with a meal.     JARDIANCE  25 MG TABS tablet Take 25 mg by mouth in the morning.     nitroGLYCERIN  (NITROSTAT ) 0.4 MG SL tablet Place 1 tablet (0.4 mg total) under the tongue every 5 (five) minutes x 3 doses as needed for chest pain. 25 tablet 12   OneTouch Delica Lancets 33G MISC      sacubitril -valsartan  (ENTRESTO ) 24-26 MG TAKE 1 TABLET BY MOUTH TWICE A DAY 180 tablet 3   spironolactone  (ALDACTONE ) 25 MG tablet TAKE 1 TABLET BY MOUTH EVERY DAY 90 tablet 3   warfarin (COUMADIN ) 5 MG tablet Take 1/2 tablet to 1 tablet by mouth daily as prescribed by the coumadin  clinic. 95 tablet 0   No current facility-administered medications for this visit.    REVIEW OF SYSTEMS:   [X]  denotes positive finding, [ ]  denotes negative finding Cardiac  Comments:  Chest pain or chest pressure:    Shortness of breath upon exertion:    Short of breath when lying flat:    Irregular heart rhythm:        Vascular    Pain in calf, thigh, or hip brought on by ambulation:    Pain in feet at night that wakes you up from your sleep:     Blood clot in your veins:    Leg swelling:         Pulmonary    Oxygen at  home:    Productive cough:     Wheezing:         Neurologic  Sudden weakness in arms or legs:     Sudden numbness in arms or legs:     Sudden onset of difficulty speaking or slurred speech:    Temporary loss of vision in one eye:     Problems with dizziness:         Gastrointestinal    Blood in stool:  x   Vomited blood:         Genitourinary    Burning when urinating:     Blood in urine:        Psychiatric    Major depression:         Hematologic    Bleeding problems:    Problems with blood clotting too easily:        Skin    Rashes or ulcers:        Constitutional    Fever or chills:      PHYSICAL EXAM:   Vitals:   10/02/24 0905  BP: (!) 148/58  Pulse: 80  Temp: 98.3 F (36.8 C)  SpO2: 93%  Weight: 168 lb (76.2 kg)  Height: 5' 8 (1.727 m)    GENERAL: The patient is a well-nourished male, in no acute distress. The vital signs are documented above. CARDIAC: There is a regular rate and rhythm.  VASCULAR: Palpable femoral pulses, left greater than right PULMONARY: Non-labored respirations ABDOMEN: Soft and non-tender.  No hernia.  MUSCULOSKELETAL: There are no major deformities or cyanosis. NEUROLOGIC: No focal weakness or paresthesias are detected. SKIN: There are no ulcers or rashes noted. PSYCHIATRIC: The patient has a normal affect.  STUDIES:   I have reviewed the following CTA: 1. Fusiform aneurysm is present involving the left common femoral artery and extending into the proximal left superficial femoral artery. This aneurysm is estimated at 2.6 cm in largest transverse dimension with additional dimensions detailed above. When using PET CT performed December 13, 2020 as a comparison, the aneurysm is estimated to have increased in size by approximately 3-4 mm. 2. Patent aortobifemoral bypass graft. 3. Occlusion of the proximal SMA with mid segment reconstitution. 4. Mild in stent stenosis in the left renal artery stent. Severe right renal artery  stenosis. 5. Right lower extremity: Aneurysm of the right common femoral artery estimated at 1.8 cm. Mild in stent stenosis in proximal right SFA stent. Mild femoral and popliteal disease. At least moderate three-vessel tibial disease is suspected. 6. Left lower extremity: Aneurysm of the left common femoral artery and proximal SFA estimated at 2.6 cm. Mild femoral and popliteal disease. At least moderate three-vessel tibial disease is suspected. 7. Enhancement in the distal rectum, possibly secondary to internal hemorrhoids.  MEDICAL ISSUES:   Bilateral femoral pseudoaneurysms, left greater than right: The patient was just recently discharged the hospital for a GI bleed.  He is not interested in surgery at this time.  I think that is reasonable since it has only grown 3 to 4 mm in 3 years.  I plan on having him return in 6 months for follow-up ultrasound  Carotid: The patient was seen by Dr. Magda for symptomatic carotid disease which turned out to not be the case after consultation with neurology.  I will get carotid Dopplers when he returns    Malvina New, IV, MD, FACS [facility] [phone] Pager [phone]

## 2024-10-02 NOTE — Patient Instructions (Addendum)
 Your provider has requested that you go to the basement level for lab work before leaving today. Press B on the elevator. The lab is located at the first door on the left as you exit the elevator.  You will be contacted by [facility] Scheduling in the next 2 days to arrange a US  Elastography.  The number on your caller ID will be [phone], please answer when they call.  If you have not heard from them in 2 days please call [phone] to schedule.     Diverticulosis Diverticulosis is a condition that develops when small pouches (diverticula) form in the wall of the large intestine (colon). The colon is where water is absorbed and stool (feces) is formed. The pouches form when the inside layer of the colon pushes through weak spots in the outer layers of the colon. You may have a few pouches or many of them. The pouches usually do not cause problems unless they become inflamed or infected. When this happens, the condition is called diverticulitis- this is left lower quadrant pain, diarrhea, fever, chills, nausea or vomiting.  If this occurs please call the office or go to the hospital. Sometimes these patches without inflammation can also have painless bleeding associated with them, if this happens please call the office or go to the hospital. Preventing constipation and increasing fiber can help reduce diverticula and prevent complications. Even if you feel you have a high-fiber diet, suggest getting on Benefiber or Cirtracel 2 times daily.  Miralax is an osmotic laxative.  It only brings more water into the stool.  This is safe to take daily.  Can take up to 17 gram of miralax twice a day.  Mix with juice or coffee.  Start 1 capful at night for 3-4 days and reassess your response in 3-4 days.  You can increase and decrease the dose based on your response.  Remember, it can take up to 3-4 days to take effect OR for the effects to wear off.   I often pair this with benefiber in  the morning to help assure the stool is not too loose.   FIBER SUPPLEMENT You can do metamucil or fibercon once or twice a day but if this causes gas/bloating please switch to Benefiber or Citracel.  Fiber is good for constipation/diarrhea/irritable bowel syndrome.  It can also help with weight loss and can help lower your bad cholesterol (LDL).  Please do 1 TBSP in the morning in water, coffee, or tea.  It can take up to a month before you can see a difference with your bowel movements.  It is cheapest from [employer], [employer], [employer].   Toileting tips to help with your constipation - Drink at least 64-80 ounces of water/liquid per day. - Establish a time to try to move your bowels every day.  For many people, this is after a cup of coffee or after a meal such as breakfast. - Sit all of the way back on the toilet keeping your back fairly straight and while sitting up, try to rest the tops of your forearms on your upper thighs.   - Raising your feet with a step stool/squatty potty can be helpful to improve the angle that allows your stool to pass through the rectum. - Relax the rectum feeling it bulge toward the toilet water.  If you feel your rectum raising toward your body, you are contracting rather than relaxing. - Breathe in and slowly exhale. Belly breath by expanding your belly  towards your belly button. Keep belly expanded as you gently direct pressure down and back to the anus.  A low pitched GRRR sound can assist with increasing intra-abdominal pressure.  (Can also trying to blow on a pinwheel and make it move, this helps with the same belly breathing) - Repeat 3-4 times. If unsuccessful, contract the pelvic floor to restore normal tone and get off the toilet.  Avoid excessive straining. - To reduce excessive wiping by teaching your anus to normally contract, place hands on outer aspect of knees and resist knee movement outward.  Hold 5-10 second then place hands just inside of knees and  resist inward movement of knees.  Hold 5 seconds.  Repeat a few times each way.  Go to the ER if unable to pass gas, severe AB pain, unable to hold down food, any shortness of breath of chest pain.  Due to recent changes in healthcare laws, you may see the results of your imaging and laboratory studies on MyChart before your provider has had a chance to review them.  We understand that in some cases there may be results that are confusing or concerning to you. Not all laboratory results come back in the same time frame and the provider may be waiting for multiple results in order to interpret others.  Please give us  48 hours in order for your provider to thoroughly review all the results before contacting the office for clarification of your results.

## 2024-10-03 ENCOUNTER — Other Ambulatory Visit: Payer: Self-pay

## 2024-10-03 DIAGNOSIS — I724 Aneurysm of artery of lower extremity: Secondary | ICD-10-CM

## 2024-10-03 DIAGNOSIS — I6523 Occlusion and stenosis of bilateral carotid arteries: Secondary | ICD-10-CM

## 2024-10-03 DIAGNOSIS — I7409 Other arterial embolism and thrombosis of abdominal aorta: Secondary | ICD-10-CM

## 2024-10-04 ENCOUNTER — Ambulatory Visit: Payer: Self-pay | Admitting: Physician Assistant

## 2024-10-05 ENCOUNTER — Ambulatory Visit (HOSPITAL_COMMUNITY)
Admission: RE | Admit: 2024-10-05 | Discharge: 2024-10-05 | Disposition: A | Discharge status: Home / Self Care | Source: Ambulatory Visit | Attending: Cardiology | Admitting: Cardiology

## 2024-10-05 DIAGNOSIS — Z952 Presence of prosthetic heart valve: Secondary | ICD-10-CM | POA: Diagnosis not present

## 2024-10-05 DIAGNOSIS — I509 Heart failure, unspecified: Secondary | ICD-10-CM | POA: Insufficient documentation

## 2024-10-05 DIAGNOSIS — I5032 Chronic diastolic (congestive) heart failure: Secondary | ICD-10-CM | POA: Diagnosis not present

## 2024-10-05 DIAGNOSIS — I3481 Nonrheumatic mitral (valve) annulus calcification: Secondary | ICD-10-CM | POA: Diagnosis not present

## 2024-10-05 LAB — ECHOCARDIOGRAM COMPLETE
AR max vel: 1.5 cm2
AV Area VTI: 1.5 cm2
AV Area mean vel: 1.57 cm2
AV Mean grad: 9 mmHg
AV Peak grad: 16.2 mmHg
Ao pk vel: 2.01 m/s
Area-P 1/2: 3.87 cm2
Calc EF: 49.9 %
Est EF: 50
MV VTI: 2.11 cm2
S' Lateral: 4.3 cm
Single Plane A2C EF: 46.4 %
Single Plane A4C EF: 49.4 %

## 2024-10-05 LAB — HEPATITIS B SURFACE ANTIGEN: Hepatitis B Surface Ag: NONREACTIVE

## 2024-10-05 LAB — ANTI-SMOOTH MUSCLE ANTIBODY, IGG: Actin (Smooth Muscle) Antibody (IGG): <20 U (ref <20)

## 2024-10-05 LAB — ANTI-NUCLEAR AB-TITER (ANA TITER): ANA Titer 1: 1:40 {titer} — ABNORMAL HIGH

## 2024-10-05 LAB — IGG: IgG (Immunoglobin G), Serum: 1793 mg/dL — ABNORMAL HIGH (ref 600–1540)

## 2024-10-05 LAB — ANA: Anti Nuclear Antibody (ANA): POSITIVE — AB

## 2024-10-05 LAB — MITOCHONDRIAL ANTIBODIES: Mitochondrial M2 Ab, IgG: <=20 U (ref <=20.0)

## 2024-10-05 LAB — HEPATITIS B SURFACE ANTIBODY,QUALITATIVE: Hep B S Ab: NONREACTIVE

## 2024-10-05 LAB — HEPATITIS C ANTIBODY: Hepatitis C Ab: NONREACTIVE

## 2024-10-05 LAB — HEPATITIS A ANTIBODY, TOTAL: Hepatitis A AB,Total: REACTIVE — AB

## 2024-10-09 ENCOUNTER — Ambulatory Visit (HOSPITAL_BASED_OUTPATIENT_CLINIC_OR_DEPARTMENT_OTHER)
Admission: RE | Admit: 2024-10-09 | Discharge: 2024-10-09 | Disposition: A | Discharge status: Home / Self Care | Source: Ambulatory Visit | Attending: Physician Assistant | Admitting: Physician Assistant

## 2024-10-09 DIAGNOSIS — R7989 Other specified abnormal findings of blood chemistry: Secondary | ICD-10-CM | POA: Diagnosis not present

## 2024-10-09 DIAGNOSIS — R932 Abnormal findings on diagnostic imaging of liver and biliary tract: Secondary | ICD-10-CM | POA: Diagnosis not present

## 2024-10-11 DIAGNOSIS — H43813 Vitreous degeneration, bilateral: Secondary | ICD-10-CM | POA: Diagnosis not present

## 2024-10-11 DIAGNOSIS — H40013 Open angle with borderline findings, low risk, bilateral: Secondary | ICD-10-CM | POA: Diagnosis not present

## 2024-10-11 DIAGNOSIS — E113211 Type 2 diabetes mellitus with mild nonproliferative diabetic retinopathy with macular edema, right eye: Secondary | ICD-10-CM | POA: Diagnosis not present

## 2024-10-11 DIAGNOSIS — H0100B Unspecified blepharitis left eye, upper and lower eyelids: Secondary | ICD-10-CM | POA: Diagnosis not present

## 2024-10-11 DIAGNOSIS — H52203 Unspecified astigmatism, bilateral: Secondary | ICD-10-CM | POA: Diagnosis not present

## 2024-10-11 DIAGNOSIS — H524 Presbyopia: Secondary | ICD-10-CM | POA: Diagnosis not present

## 2024-10-11 DIAGNOSIS — H0100A Unspecified blepharitis right eye, upper and lower eyelids: Secondary | ICD-10-CM | POA: Diagnosis not present

## 2024-10-11 DIAGNOSIS — H04123 Dry eye syndrome of bilateral lacrimal glands: Secondary | ICD-10-CM | POA: Diagnosis not present

## 2024-10-19 ENCOUNTER — Ambulatory Visit: Attending: Cardiovascular Disease

## 2024-10-19 ENCOUNTER — Telehealth (HOSPITAL_COMMUNITY): Payer: Self-pay

## 2024-10-19 DIAGNOSIS — G459 Transient cerebral ischemic attack, unspecified: Secondary | ICD-10-CM | POA: Diagnosis not present

## 2024-10-19 DIAGNOSIS — Z5181 Encounter for therapeutic drug level monitoring: Secondary | ICD-10-CM

## 2024-10-19 DIAGNOSIS — I359 Nonrheumatic aortic valve disorder, unspecified: Secondary | ICD-10-CM | POA: Diagnosis not present

## 2024-10-19 LAB — POCT INR: INR: 2.4 (ref 2.0–3.0)

## 2024-10-19 NOTE — Progress Notes (Signed)
 INR 2.4 Please see anticoagulation encounter Take 1.5 tablets today only then Continue taking warfarin 1 tablet daily except for 1/2 a tablet on Mondays and Fridays. Recheck INR in 4 weeks.  Coumadin  Clinic for any changes in medications or upcoming procedures.  Coumadin  Clinic [phone] *Per pt : Amio reduced to 100 mg daily May 2025 by HF clinic*

## 2024-10-19 NOTE — Patient Instructions (Signed)
 Take 1.5 tablets today only then Continue taking warfarin 1 tablet daily except for 1/2 a tablet on Mondays and Fridays. Recheck INR in 4 weeks.  Coumadin  Clinic for any changes in medications or upcoming procedures.  Coumadin  Clinic [phone] *Per pt : Amio reduced to 100 mg daily May 2025 by HF clinic*

## 2024-10-19 NOTE — Telephone Encounter (Signed)
 Called to confirm/remind patient of their appointment at the [facility] on 10/20/24.   Appointment:   [x] Confirmed  [] Left mess   [] No answer/No voice mail  [] VM Full/unable to leave message  [] Phone not in service  Patient reminded to bring all medications and/or complete list.  Confirmed patient has transportation. Gave directions, instructed to utilize valet parking.

## 2024-10-20 ENCOUNTER — Other Ambulatory Visit (HOSPITAL_COMMUNITY): Payer: Self-pay

## 2024-10-20 ENCOUNTER — Ambulatory Visit (HOSPITAL_COMMUNITY)
Admission: RE | Admit: 2024-10-20 | Discharge: 2024-10-20 | Disposition: A | Discharge status: Home / Self Care | Source: Ambulatory Visit | Attending: Family Medicine | Admitting: Family Medicine

## 2024-10-20 ENCOUNTER — Encounter (HOSPITAL_COMMUNITY): Payer: Self-pay

## 2024-10-20 ENCOUNTER — Ambulatory Visit (HOSPITAL_COMMUNITY): Payer: Self-pay | Admitting: Family Medicine

## 2024-10-20 VITALS — BP 128/62 | HR 69 | Wt 171.4 lb

## 2024-10-20 DIAGNOSIS — Z7901 Long term (current) use of anticoagulants: Secondary | ICD-10-CM | POA: Insufficient documentation

## 2024-10-20 DIAGNOSIS — I5022 Chronic systolic (congestive) heart failure: Secondary | ICD-10-CM | POA: Diagnosis not present

## 2024-10-20 DIAGNOSIS — Z7984 Long term (current) use of oral hypoglycemic drugs: Secondary | ICD-10-CM | POA: Diagnosis not present

## 2024-10-20 DIAGNOSIS — I2510 Atherosclerotic heart disease of native coronary artery without angina pectoris: Secondary | ICD-10-CM | POA: Insufficient documentation

## 2024-10-20 DIAGNOSIS — I5032 Chronic diastolic (congestive) heart failure: Secondary | ICD-10-CM | POA: Diagnosis not present

## 2024-10-20 DIAGNOSIS — I255 Ischemic cardiomyopathy: Secondary | ICD-10-CM | POA: Insufficient documentation

## 2024-10-20 DIAGNOSIS — Z95820 Peripheral vascular angioplasty status with implants and grafts: Secondary | ICD-10-CM | POA: Diagnosis not present

## 2024-10-20 DIAGNOSIS — I130 Hypertensive heart and chronic kidney disease with heart failure and stage 1 through stage 4 chronic kidney disease, or unspecified chronic kidney disease: Secondary | ICD-10-CM | POA: Insufficient documentation

## 2024-10-20 DIAGNOSIS — E785 Hyperlipidemia, unspecified: Secondary | ICD-10-CM | POA: Diagnosis not present

## 2024-10-20 DIAGNOSIS — K922 Gastrointestinal hemorrhage, unspecified: Secondary | ICD-10-CM | POA: Insufficient documentation

## 2024-10-20 DIAGNOSIS — E1151 Type 2 diabetes mellitus with diabetic peripheral angiopathy without gangrene: Secondary | ICD-10-CM | POA: Diagnosis not present

## 2024-10-20 DIAGNOSIS — I708 Atherosclerosis of other arteries: Secondary | ICD-10-CM | POA: Insufficient documentation

## 2024-10-20 DIAGNOSIS — E1122 Type 2 diabetes mellitus with diabetic chronic kidney disease: Secondary | ICD-10-CM | POA: Diagnosis not present

## 2024-10-20 DIAGNOSIS — I6529 Occlusion and stenosis of unspecified carotid artery: Secondary | ICD-10-CM | POA: Diagnosis not present

## 2024-10-20 DIAGNOSIS — D693 Immune thrombocytopenic purpura: Secondary | ICD-10-CM | POA: Diagnosis not present

## 2024-10-20 DIAGNOSIS — Z79899 Other long term (current) drug therapy: Secondary | ICD-10-CM | POA: Insufficient documentation

## 2024-10-20 DIAGNOSIS — I739 Peripheral vascular disease, unspecified: Secondary | ICD-10-CM

## 2024-10-20 DIAGNOSIS — Z951 Presence of aortocoronary bypass graft: Secondary | ICD-10-CM | POA: Diagnosis not present

## 2024-10-20 DIAGNOSIS — I480 Paroxysmal atrial fibrillation: Secondary | ICD-10-CM | POA: Diagnosis not present

## 2024-10-20 DIAGNOSIS — I4892 Unspecified atrial flutter: Secondary | ICD-10-CM | POA: Diagnosis not present

## 2024-10-20 DIAGNOSIS — Z8673 Personal history of transient ischemic attack (TIA), and cerebral infarction without residual deficits: Secondary | ICD-10-CM | POA: Diagnosis not present

## 2024-10-20 DIAGNOSIS — I10 Essential (primary) hypertension: Secondary | ICD-10-CM | POA: Diagnosis present

## 2024-10-20 DIAGNOSIS — I4821 Permanent atrial fibrillation: Secondary | ICD-10-CM | POA: Diagnosis not present

## 2024-10-20 DIAGNOSIS — N183 Chronic kidney disease, stage 3 unspecified: Secondary | ICD-10-CM | POA: Insufficient documentation

## 2024-10-20 DIAGNOSIS — Z952 Presence of prosthetic heart valve: Secondary | ICD-10-CM | POA: Diagnosis not present

## 2024-10-20 DIAGNOSIS — I6523 Occlusion and stenosis of bilateral carotid arteries: Secondary | ICD-10-CM | POA: Insufficient documentation

## 2024-10-20 LAB — BASIC METABOLIC PANEL WITH GFR
Anion gap: 11 (ref 5–15)
BUN: 30 mg/dL — ABNORMAL HIGH (ref 8–23)
CO2: 26 mmol/L (ref 22–32)
Calcium: 8.9 mg/dL (ref 8.9–10.3)
Chloride: 99 mmol/L (ref 98–111)
Creatinine, Ser: 1.77 mg/dL — ABNORMAL HIGH (ref 0.61–1.24)
GFR, Estimated: 39 mL/min — ABNORMAL LOW (ref >60)
Glucose, Bld: 215 mg/dL — ABNORMAL HIGH (ref 70–99)
Potassium: 4.1 mmol/L (ref 3.5–5.1)
Sodium: 136 mmol/L (ref 135–145)

## 2024-10-20 NOTE — Patient Instructions (Addendum)
 There has been no changes to your medications  Labs done today, your results will be available in MyChart, we will contact you for abnormal readings.  REPEAT Blood work on 11/16/24   Your physician recommends that you schedule a follow-up appointment in 6 months ( May 2026) ** PLEASE CALL THE OFFICE IN MARCH 2026 TO ARRANGE YOUR FOLLOW UP APPOINTMENT.**  If you have any questions or concerns before your next appointment please send us  a message through [place] or call our office at [phone].    TO LEAVE A MESSAGE FOR THE NURSE SELECT OPTION 2, PLEASE LEAVE A MESSAGE INCLUDING: YOUR NAME DATE OF BIRTH CALL BACK NUMBER REASON FOR CALL**this is important as we prioritize the call backs  YOU WILL RECEIVE A CALL BACK THE SAME DAY AS LONG AS YOU CALL BEFORE 4:00 PM At the [facility], you and your health needs are our priority. As part of our continuing mission to provide you with exceptional heart care, we have created designated Provider Care Teams. These Care Teams include your primary Cardiologist (physician) and Advanced Practice Providers (APPs- Physician Assistants and Nurse Practitioners) who all work together to provide you with the care you need, when you need it.   You may see any of the following providers on your designated Care Team at your next follow up: Dr Toribio Fuel Dr Ezra Shuck Dr. Morene Brownie Greig Mosses, NP Caffie Shed, [place] [facility], [place] Beckey Coe, NP Jordan Lee, NP Ellouise Class, NP Tinnie Redman, PharmD Jaun Bash, PharmD   Please be sure to bring in all your medications bottles to every appointment.    Thank you for choosing [facility]

## 2024-10-20 NOTE — Progress Notes (Signed)
 ADVANCED HF CLINIC NOTE  PCP: Onita Rush, MD HF Cardiology: Dr. Rolan  HPI: [name] [name] is a 78 y.o. male with a history of CAD s/p CABG, mechanical AVR on coumadin , PAF, extensive PAD, DM, HTN, HLD, ITP, TIA, and recurrent GI bleed.   Long history of PAF with RVR S/P multiple DCCVs and failed AF ablation (12/21)  LHC in 10/20 showed 99% left main stenosis and patent LIMA to occluded LAD and a patent sequential vein to the PDA and PLA of an occluded dominant right. Medical therapy rec'd. Readmitted for CP s/p successful orbital atherectomy, PTCA, and DES to LM. LIMA-LAD patent. SVG-PDA patent. EF down to 30-35%, mechanical AoV ok. TEE 11/9 w/ improved EF, 40-45%.   In 3/21 s/p stenting to right SFA.    Echo 1/22 showed EF 60-65%, normal RV size and systolic function, stable mechanical aortic valve.   CT chest 1/22 diagnosed with squamous cell lung cancer.  He underwent radiation therapy.  He has had episodes concerning for vertebrobasilar TIAs.  Significant disease noted in the vertebral arteries.  He was seen by neurology who wanted him referred to interventional neuroradiology for angiography with possible PCI to the vertebrals. It does not appear that this ever occurred.   Echo in 1/24 showed EF 45-50%, basal to mid inferolateral hypokinesis, mildly decreased RV systolic function, mechanical aortic valve with mean gradient 6 mmHg.   Noted to be in Aflutter 1/25 in clinic and underwent unsuccessful TEE/ DCCV x 3 in 2/25 and continued on amiodarone . TEE showed EF 50-55%, no regional WMAs, nl RV, Trivial TR, peak RV-RA gradient 22 mmHg, Trivial mitral regurgitation, mechanical AoV ok.  Repeat DCCV on 02/21/24 also with post procedure EKG with atrial fibrillation.  S/p PTA and balloon angioplasty of L AT for gangrenous toes in 4/25.  CTA in 9/25 showed occluded SMA, bilateral renal artery stenosis, aneurysmal dilation of proximal left profunda femoral artery.   He was  admitted in 9/25 for GI bleeding.   Echo 10/25 showed EF 50%, mild RV dysfunction, normally functioning mechanical aortic valve.  Today he returns for HF follow up. Overall feeling fine. He has mild dyspnea with walking short distances on flat ground, this waxes and wanes. Exercises using stationary bike. Not back to golfing yet. Denies palpitations, abnormal bleeding, CP, dizziness, edema, or PND/Orthopnea. Appetite ok. Weight at home 160-165 pounds. Taking all medications.   ECG (personally reviewed): none ordered today  Labs (10/25): LDL 81, BNP 491, TSH normal, K 5.0, creatinine 2.17, LFTs normal   PMH  1. CAD:  S/P CABG (3/04) with LIMA-LAD, seq SVG-PDA/PLV.  - NSTEMI 10/20 with cath showing patent LIMA-LAD and SVG-PDA/PLV but 99% LM stenosis, occluded LAD, moderate ramus and high OM1 jeopardized, also 90% mid AV groove LCx stenosis.  Occluded RCA.  Patient ultimately had orbital atherectomy and stenting of left main with a 4.0 x 15 mm Resolute Onyx DES.  2. Mechanical aortic valve: TEE 11/20 showed stable-appearing mechanical valve, mean gradient 9 mmHg.  - echo 10/25 with stable-appearing mechanical valve, mean gradient 9 mmHg. 3. PAD: Aortobifemoral bypass in 2016 with follow up iliac stenting and femoral endarterectomy. In 2017, he had subsequent ostial and mid right SFA intervention. - Angiography 10/20 with 80% in-stent restenosis in proximal right SFA.  - Peripheral arterial dopplers (2/21) with 75-99% R SFA stenosis in prior stented area.  - 3/21 Right SFA stent (Dr. Court).  - ABIs (1/22): Stable mild left SFA disease.  - Peripheral arterial  dopplers (2/23): 50-99% proximal R SFA in-stent restenosis.  Medical management for now.  - Peripheral arterial dopplers (3/24): 50-99% proximal R SFA in-stent restenosis.  - S/p PTA and balloon angioplasty of L AT for gangrenous toes in 4/25. - CTA (9/25): occluded SMA, bilateral renal artery stenosis, aneurysmal dilation of proximal left  profunda femoral artery.  4. Type 2 diabetes.  5. HTN - Renal artery dopplers (8/21): No significant stenosis.  6. Hyperlipidemia 7. H/o ITP  8. TIAs  9. Chronic Systolic HF: Ischemic cardiomyopathy.  - Echo (10/20): EF 30-35%, mechanical AoV ok.  - TEE (11/20): w/ improved EF, 40-45%. Mechanical aortic valve with mean gradient 9 mmHg, normal RV.  - Echo (6/21): EF 55-60%, mild LVH, mechanical AoV with mean gradient 9 mmHg.  - Echo (1/22): EF 60-65%, normal RV size and systolic function, stable mechanical aortic valve.  - Echo (1/24): EF 45-50%, basal to mid inferolateral hypokinesis, mildly decreased RV systolic function, mechanical aortic valve with mean gradient 6 mmHg. - TEE (2/25): EF 50-55%, normal RV, mechanical aortic valve with mean gradient 7 mmHg.  - Echo (10/25): EF 50%, normal RV, mechanical aortic valve with mean gradient 9 mmHg. 10. Atrial fibrillation/flutter: Now appears permanent - Atrial fibrillation ablation 12/21.  - DCCV 1/22 - Failed 2/25 and 3/25 DCCVs 11. Renal artery stenosis: Hx of left renal artery stenting in 2016 with repeat intervention on left for 75% in-stent restenosis, with progression of disease on the right renal artery showing 60% stenosis on duplex 07/12/19. 12. Carotid stenosis: 4/21 carotid dopplers with right subclavian stenosis, 40-59% RICA stenosis.  - Carotid dopplers (4/22): 1-39% BICA stenosis.  13. Squamous cell lung cancer: Treated with XRT.     ROS: All systems negative except as listed in HPI, PMH and Problem List.  Social History   Socioeconomic History   Marital status: Divorced    Spouse name: Not on file   Number of children: 3   Years of education: Not on file   Highest education level: Not on file  Occupational History   Occupation: Retired  Tobacco Use   Smoking status: Former    Current packs/day: 0.00    Average packs/day: 1.5 packs/day for 30.0 years (45.0 ttl pk-yrs)    Types: Cigarettes, Cigars    Start date:  12/16/1963    Quit date: 12/15/1993    Years since quitting: 30.8   Smokeless tobacco: Never   Tobacco comments:    Started smoking at age 36, quit 70. Stopped smoking cigars 5 years ago. 09/12/2024.  Vaping Use   Vaping status: Never Used  Substance and Sexual Activity   Alcohol  use: Yes    Alcohol /week: 15.0 standard drinks of alcohol     Types: 1 Cans of beer, 7 Shots of liquor, 7 Standard drinks or equivalent per week    Comment: drinks 1 martini's a night (2 shots)   Drug use: No   Sexual activity: Yes  Other Topics Concern   Not on file  Social History Narrative   Tries to remain active.  Frequent golfer but claudication limits this.   1 child has passed   Social Drivers of Corporate Investment Banker Strain: Not on file  Food Insecurity: No Food Insecurity (08/09/2024)   Hunger Vital Sign    Worried About Running Out of Food in the Last Year: Never true    Ran Out of Food in the Last Year: Never true  Transportation Needs: No Transportation Needs (08/09/2024)   PRAPARE -  Administrator, Civil Service (Medical): No    Lack of Transportation (Non-Medical): No  Physical Activity: Not on file  Stress: Not on file  Social Connections: Unknown (08/09/2024)   Social Connection and Isolation Panel    Frequency of Communication with Friends and Family: Once a week    Frequency of Social Gatherings with Friends and Family: Once a week    Attends Religious Services: Patient declined    Database Administrator or Organizations: Patient declined    Attends Banker Meetings: Patient declined    Marital Status: Patient declined  Intimate Partner Violence: Not At Risk (08/09/2024)   Humiliation, Afraid, Rape, and Kick questionnaire    Fear of Current or Ex-Partner: No    Emotionally Abused: No    Physically Abused: No    Sexually Abused: No   Family History  Problem Relation Age of Onset   Coronary artery disease Mother        bypass surgery - deceased   Heart  disease Father        murmur, valve replacement - deceased   Breast cancer Sister    Diabetes Other        grandmother   Diabetes Paternal Grandmother    Diabetes Paternal Aunt    Colon cancer Neg Hx    Colon polyps Neg Hx    Esophageal cancer Neg Hx    Rectal cancer Neg Hx    Stomach cancer Neg Hx    Current Outpatient Medications  Medication Sig Dispense Refill   aspirin  EC 81 MG tablet Take 1 tablet (81 mg total) by mouth daily.     atorvastatin  (LIPITOR ) 80 MG tablet Take 80 mg by mouth in the morning.     Blood Glucose Monitoring Suppl (ONETOUCH VERIO IQ SYSTEM) w/Device KIT      carvedilol  (COREG ) 6.25 MG tablet Take 1 tablet (6.25 mg total) by mouth 2 (two) times daily with a meal. 180 tablet 3   Evolocumab 140 MG/ML SOAJ Inject 140 mg into the skin every 14 (fourteen) days.     ezetimibe  (ZETIA ) 10 MG tablet Take 1 tablet (10 mg total) by mouth daily. 30 tablet 11   fluorouracil (EFUDEX) 5 % cream Apply 1 Application topically daily as needed (dry skin).     furosemide  (LASIX ) 20 MG tablet Take 2 tablets (40 mg total) by mouth every morning AND 1 tablet (20 mg total) every evening. 180 tablet 3   glucose blood (ONETOUCH VERIO) test strip      glyBURIDE  (DIABETA ) 2.5 MG tablet Take 5 mg by mouth 2 (two) times daily with a meal.     JARDIANCE  25 MG TABS tablet Take 25 mg by mouth in the morning.     nitroGLYCERIN  (NITROSTAT ) 0.4 MG SL tablet Place 1 tablet (0.4 mg total) under the tongue every 5 (five) minutes x 3 doses as needed for chest pain. 25 tablet 12   OneTouch Delica Lancets 33G MISC      sacubitril -valsartan  (ENTRESTO ) 24-26 MG TAKE 1 TABLET BY MOUTH TWICE A DAY 180 tablet 3   spironolactone  (ALDACTONE ) 25 MG tablet TAKE 1 TABLET BY MOUTH EVERY DAY 90 tablet 3   warfarin (COUMADIN ) 5 MG tablet Take 1/2 tablet to 1 tablet by mouth daily as prescribed by the coumadin  clinic. 95 tablet 0   No current facility-administered medications for this encounter.   BP 128/62    Pulse 69   Wt 77.7 kg (171 lb 6.4  oz)   SpO2 93%   BMI 26.06 kg/m   Wt Readings from Last 3 Encounters:  10/20/24 77.7 kg (171 lb 6.4 oz)  10/02/24 76.8 kg (169 lb 4 oz)  10/02/24 76.2 kg (168 lb)   PHYSICAL EXAM: General:  NAD. No resp difficulty, walked into clinic HEENT: Normal Neck: Supple. No JVD. Cor: Irregular rate & rhythm. No rubs, gallops or murmurs. +  mechanical S2 Lungs: Clear Abdomen: Soft, nontender, nondistended.  Extremities: No cyanosis, clubbing, rash, edema Neuro: Alert & oriented x 3, moves all 4 extremities w/o difficulty. Affect pleasant.  ASSESSMENT & PLAN: 1. Permanent Atrial Flutter/ Atrial Fibrillation: Previous multiple DCCVs for Afib and had Afib ablation in 2021.  Failed DCCV x3 attempts in 2/25, followed by second failed DCCV x3 in 3/25.  He did not want to try a repeat AF/AFL ablation.   - He if off amiodarone  at this point.   - Continue Coreg  6.25 mg bid for rate control.  - Continue warfarin. INR followed by Coumadin  Clnic 2. Chronic Heart Failure, HFrEF>>HFimpEF: Ischemic cardiomyopathy.  Echo 10/20 showed EF 30-35% and WMA, prior echo earlier in 10/20 showed EF 50-55%. Cath 10/20 showed jeopardized ramus and LCx territory (99% distal left main, occluded LAD, patent LIMA-LAD). Concerned that ischemia in this territory triggered CHF, worsening of LV function. Now s/p DES to left main, TEE in 11/20 after intervention showed EF higher at 40-45%.  Echo in 1/22 showed EF 60-65% with normal RV. Echo in 1/24 showed  EF 45-50%, basal to mid inferolateral hypokinesis, mildly decreased RV systolic function, mechanical aortic valve with mean gradient 6 mmHg. TEE 2/25 EF 50-55%, normal RV.  NYHA II, he is not volume overloaded today. - Continue Lasix  40 qam/20 qpm.  BMET today. - Continue Entresto  24-26 mg bid - Continue Jardiance  25 mg daily  - Continue spiro 25 mg daily  3. CAD: Complicated disease, coronary angiography 10/20 showed patent SVG-RCA territory and  patent LIMA-LAD.  The proximal LAD was occluded and there was 99% distal left main stenosis.  This left the ramus and LCx in jeopardy.  He was not a good candidate for protected PCI => cannot place Impella with mechanical aortic valve and peripheral vascular disease likely precludes a femoral IABP.  It was decided to try to manage him medically. However, he returned with progressive chest pain episodes and NSTEMI, hs-TnI up to 935. S/p successful orbital atherectomy and stenting of the left main with a 4.0 x 15 mm Resolute Onyx DES 10/10/19.  No chest pain.  - Goal LDL < 55, continue Repatha and Zetia  10 mg daily, he has repeat lipids arranged for next month. - He is on ASA with mechanical aortic valve.  4. S/p Mechanical AVR: Stable on echo 1/24 and TEE 2/25.  - Continue warfarin + ASA 81 (atrial fibrillation + mechanical valve).  - Echo 10/25 showed stable-appearing mechanical valve, mean gradient 9 mmhg 5. PAD:  Extensive, s/p aorto-bifemoral bypass, h/o iliac stenting, h/o femoral endarterectomy, PCI to right SFA in 2017.  Angiography in 10/20 with 80% ISR in right SFA.  He had PCI to right SFA in 3/21, no claudication now.  Repeat ABIs in 1/22 with mild left SFA disease. Peripheral arterial dopplers in 2/23 showed 50-99% in-stent restenosis in the right SFA stent, repeat dopplers in 3/24 were unchanged. Now s/p PTA and balloon angioplasty of L AT 4/25. Now with minimal claudication.  - Follows [facility].  6. Carotid/subclavian stenosis: Mild disease on dopplers in  4/22.  7. H/o GIB: Admission in 9/25 with GI bleeding, has had no overt bleeding since discharge.  8. CKD stage 3: Continue SGLT21. Stable.  - BMET today.  Follow up in 6 months with Dr. Rolan Harlene CHRISTELLA Glena, FNP 10/20/2024

## 2024-10-30 ENCOUNTER — Encounter: Payer: Self-pay | Admitting: Primary Care

## 2024-10-30 ENCOUNTER — Ambulatory Visit (HOSPITAL_BASED_OUTPATIENT_CLINIC_OR_DEPARTMENT_OTHER)

## 2024-10-30 ENCOUNTER — Ambulatory Visit: Admitting: Primary Care

## 2024-10-30 VITALS — BP 124/70 | HR 92 | Temp 97.4°F | Ht 68.0 in | Wt 168.4 lb

## 2024-10-30 DIAGNOSIS — J849 Interstitial pulmonary disease, unspecified: Secondary | ICD-10-CM

## 2024-10-30 DIAGNOSIS — C3492 Malignant neoplasm of unspecified part of left bronchus or lung: Secondary | ICD-10-CM

## 2024-10-30 DIAGNOSIS — J449 Chronic obstructive pulmonary disease, unspecified: Secondary | ICD-10-CM | POA: Diagnosis not present

## 2024-10-30 LAB — PULMONARY FUNCTION TEST
DL/VA % pred: 96 %
DL/VA: 3.81 ml/min/mmHg/L
DLCO cor % pred: 56 %
DLCO cor: 12.96 ml/min/mmHg
DLCO unc % pred: 53 %
DLCO unc: 12.25 ml/min/mmHg
FEF 25-75 Post: 0.84 L/s
FEF 25-75 Pre: 0.52 L/s
FEF2575-%Change-Post: 60 %
FEF2575-%Pred-Post: 45 %
FEF2575-%Pred-Pre: 28 %
FEV1-%Change-Post: 20 %
FEV1-%Pred-Post: 51 %
FEV1-%Pred-Pre: 42 %
FEV1-Post: 1.36 L
FEV1-Pre: 1.13 L
FEV1FVC-%Change-Post: 6 %
FEV1FVC-%Pred-Pre: 76 %
FEV6-%Change-Post: 14 %
FEV6-%Pred-Post: 67 %
FEV6-%Pred-Pre: 58 %
FEV6-Post: 2.33 L
FEV6-Pre: 2.04 L
FEV6FVC-%Change-Post: 0 %
FEV6FVC-%Pred-Post: 107 %
FEV6FVC-%Pred-Pre: 106 %
FVC-%Change-Post: 13 %
FVC-%Pred-Post: 62 %
FVC-%Pred-Pre: 55 %
FVC-Post: 2.33 L
FVC-Pre: 2.06 L
Post FEV1/FVC ratio: 58 %
Post FEV6/FVC ratio: 100 %
Pre FEV1/FVC ratio: 55 %
Pre FEV6/FVC Ratio: 99 %
RV % pred: 248 %
RV: 6.27 L
TLC % pred: 129 %
TLC: 8.63 L

## 2024-10-30 MED ORDER — STIOLTO RESPIMAT 2.5-2.5 MCG/ACT IN AERS: 2.0000 | INHALATION_SPRAY | Freq: Every day | RESPIRATORY_TRACT | Status: DC

## 2024-10-30 NOTE — Progress Notes (Signed)
 Full PFT performed today.

## 2024-10-30 NOTE — Progress Notes (Signed)
 @Patient  ID: [name] [name], male    DOB: 1946/05/04, 77 y.o.   MRN: [id number]  Chief Complaint  Patient presents with   Interstitial Lung Disease    PFT F/U    Referring provider: Onita Rush, MD  HPI: 78 year old male, former smoker. PMH significant for early stage squamous cell lung cancer dx in March 2020, treated with three sessions of radiation. Patient of Dr. Theophilus, last seen in October 2025 for evaluation of interstitial lung diease.     10/30/2024- Interim hx  Discussed the use of AI scribe software for clinical note transcription with the patient, who gave verbal consent to proceed.  Summary: Hx stage 1A squalmous cell carcinoma left lung dx in 2020 Persistent chest congestion for 1 year. No shortness of breath or cough No exposure to mold, feather pillows, bird feathers or asbestosis  No family history of lung disease No evidence of recurrence of malignancy No chemotherapy received Radiation therapy may have contributed, current progression suggest an alternative etiology Checking ANA, RF, CCP, CT and PFTs Continue lasix  and repeat echo with cardiology   History of Present Illness [name] [name] is a 78 year old male with interstitial lung disease and a history of early stage squamous cell lung cancer who presents for follow-up after a breathing test and CT scan.  He has a history of early stage squamous cell lung cancer diagnosed in March 2020, treated with three sessions of radiation therapy. He is a former smoker with a 30-year history. Recently, he was diagnosed with interstitial lung disease and is here for follow-up after a breathing test and CT scan.  He experiences chest congestion and fatigue but has no shortness of breath or difficulty with daily activities. He has chest congestion with sputum production containing blood, which he attributes to radiation damage and Coumadin  use. The blood in the sputum is reddish but not bright  red.  He occasionally hears wheezing.  His ANA test was slightly elevated, but he has no arthritic complaints, lupus-like rashes, or significant dry mouth or eyes. He recently visited an ophthalmologist and uses artificial tears for eye irritation, which has been beneficial.  He is currently on Coumadin  and manages the side effects of radiation therapy. There is no evidence of lung cancer recurrence, and he has not undergone chemotherapy. He follows up every six months for cancer surveillance.  HRCT in October 2025 showed very mild basilar subpleural ground glass with interlobular and septal thickening along with emphysema.   Pulmonary function testing: 10/30/2024 >> FVC 2.33 (62%), FEV1 1.36 (51%), ratio 58 Moderate obstructive defect with positive BD response, moderate diffusion defect   Vitals:   10/30/24 1501 10/30/24 1502 10/30/24 1503 10/30/24 1504  BP:      Pulse: 64 77 85 92  Temp:      Height:      Weight:      SpO2: 96% Comment: ra, resting 98% Comment: lap 1, exertion, ra 95% Comment: lap 2, exertion, ra 94% Comment: lap 3, exertion, ra  BMI (Calculated):         No Known Allergies  Immunization History  Administered Date(s) Administered   Fluzone Influenza virus vaccine,trivalent (IIV3), split virus 10/22/2010, 11/25/2011, 11/11/2012   INFLUENZA, HIGH DOSE SEASONAL PF 09/06/2018, 08/01/2019   Influenza Split 10/15/2009, 11/09/2013, 11/13/2014, 08/27/2020   Influenza, Quadrivalent, Recombinant, Inj, Pf 09/06/2020   Influenza,inj,Quad PF,6+ Mos 11/13/2014, 09/10/2015   Influenza-Unspecified 09/01/2016   Moderna Covid-19 Fall Seasonal Vaccine 54yrs & older  10/12/2023   PFIZER(Purple Top)SARS-COV-2 Vaccination 12/28/2019, 01/18/2020, 09/10/2020   Pfizer Covid-19 Vaccine Bivalent Booster 83yrs & up 08/19/2021   Pneumococcal Conjugate-13 06/05/2014, 08/14/2014   Pneumococcal Polysaccharide-23 03/03/2022   Tdap 12/22/2016   Zoster, Live 09/01/2006    Past Medical  History:  Diagnosis Date   Adenomatous colon polyp 09/1997   Anemia    Aortic stenosis    s/p st. jude mechanical AVR - Chronic Coumadin    Blood transfusion    related to ITP   CHF (congestive heart failure) (HCC)    COPD (chronic obstructive pulmonary disease) (HCC)    patient denies this dx on 01/27/21   Coronary artery disease    s/p cabg x 3 11/2003: lima-lad, seq vg to rpda and rpl   Diverticulitis of colon    Dysrhythmia    a-fib   GERD (gastroesophageal reflux disease)    patient states he has never had reflux   Heart murmur    History of radiation therapy 02/18/2021, 02/20/2021, 02/25/2021   SBRT to left lung     Dr Lynwood Nasuti   Hyperlipidemia    Hypertension    ITP (idiopathic thrombocytopenic purpura)    Lung cancer (HCC)    last week   Peripheral arterial disease    a. history of aortobifemoral bypass grafting by Dr. Krystal early b. LE angiography 04/22/2015 patent aortobifem graft, DES to R SFA   Peripheral vascular disease    s/p Left external Iliac Artery stenting and subsequent left femoral endarterectomy 02/2011- post op course complicated by wound infxn req I&D 03/2011   Renal artery stenosis, native, bilateral    a. bilateral renal artery stenosis by recent duplex ultrasound b. L renal artery stent 02/2015, R renal artery patent on angiogram   Stroke (HCC) 2015   TIA (transient ischemic attack) ~ 2013   Type II diabetes mellitus (HCC)     Tobacco History: Social History   Tobacco Use  Smoking Status Former   Current packs/day: 0.00   Average packs/day: 1.5 packs/day for 30.0 years (45.0 ttl pk-yrs)   Types: Cigarettes, Cigars   Start date: 12/16/1963   Quit date: 12/15/1993   Years since quitting: 30.8  Smokeless Tobacco Never  Tobacco Comments   Started smoking at age 71, quit 81. Stopped smoking cigars 5 years ago. 09/12/2024.   Counseling given: Not Answered Tobacco comments: Started smoking at age 71, quit 20. Stopped smoking cigars 5 years ago.  09/12/2024.   Outpatient Medications Prior to Visit  Medication Sig Dispense Refill   aspirin  EC 81 MG tablet Take 1 tablet (81 mg total) by mouth daily.     atorvastatin  (LIPITOR ) 80 MG tablet Take 80 mg by mouth in the morning.     Blood Glucose Monitoring Suppl (ONETOUCH VERIO IQ SYSTEM) w/Device KIT      carvedilol  (COREG ) 6.25 MG tablet Take 1 tablet (6.25 mg total) by mouth 2 (two) times daily with a meal. 180 tablet 3   Evolocumab 140 MG/ML SOAJ Inject 140 mg into the skin every 14 (fourteen) days.     ezetimibe  (ZETIA ) 10 MG tablet Take 1 tablet (10 mg total) by mouth daily. 30 tablet 11   fluorouracil (EFUDEX) 5 % cream Apply 1 Application topically daily as needed (dry skin).     furosemide  (LASIX ) 20 MG tablet Take 2 tablets (40 mg total) by mouth every morning AND 1 tablet (20 mg total) every evening. 180 tablet 3   glucose blood (ONETOUCH VERIO) test strip  glyBURIDE  (DIABETA ) 2.5 MG tablet Take 5 mg by mouth 2 (two) times daily with a meal.     JARDIANCE  25 MG TABS tablet Take 25 mg by mouth in the morning.     nitroGLYCERIN  (NITROSTAT ) 0.4 MG SL tablet Place 1 tablet (0.4 mg total) under the tongue every 5 (five) minutes x 3 doses as needed for chest pain. 25 tablet 12   OneTouch Delica Lancets 33G MISC      sacubitril -valsartan  (ENTRESTO ) 24-26 MG TAKE 1 TABLET BY MOUTH TWICE A DAY 180 tablet 3   spironolactone  (ALDACTONE ) 25 MG tablet TAKE 1 TABLET BY MOUTH EVERY DAY 90 tablet 3   warfarin (COUMADIN ) 5 MG tablet Take 1/2 tablet to 1 tablet by mouth daily as prescribed by the coumadin  clinic. 95 tablet 0   No facility-administered medications prior to visit.   Review of Systems  Review of Systems  Constitutional:  Positive for fatigue.  HENT:  Positive for congestion.   Respiratory:  Positive for cough. Negative for shortness of breath.   Cardiovascular: Negative.      Physical Exam  BP 124/70   Pulse 69   Temp (!) 97.4 F (36.3 C)   Ht 5' 8 (1.727 m)  Comment: PT STATED  Wt 168 lb 6.4 oz (76.4 kg)   SpO2 95% Comment: RA  BMI 25.61 kg/m  Physical Exam Constitutional:      General: He is not in acute distress.    Appearance: Normal appearance. He is not ill-appearing.  HENT:     Head: Normocephalic and atraumatic.  Cardiovascular:     Rate and Rhythm: Normal rate and regular rhythm.  Pulmonary:     Comments: Distant rales right base, otherwise clear Musculoskeletal:        General: Normal range of motion.  Skin:    General: Skin is warm and dry.  Neurological:     General: No focal deficit present.     Mental Status: He is alert and oriented to person, place, and time. Mental status is at baseline.  Psychiatric:        Mood and Affect: Mood normal.        Behavior: Behavior normal.        Thought Content: Thought content normal.        Judgment: Judgment normal.      Lab Results:  CBC    Component Value Date/Time   WBC 7.4 10/02/2024 1147   RBC 4.60 10/02/2024 1147   HGB 12.8 (L) 10/02/2024 1147   HGB 16.4 03/21/2024 1320   HCT 40.1 10/02/2024 1147   HCT 50.4 03/21/2024 1320   PLT 270.0 10/02/2024 1147   PLT 216 03/21/2024 1320   MCV 87.2 10/02/2024 1147   MCV 96 03/21/2024 1320   MCH 31.2 08/09/2024 0841   MCHC 32.0 10/02/2024 1147   RDW 16.4 (H) 10/02/2024 1147   RDW 14.3 03/21/2024 1320   LYMPHSABS 1.4 10/02/2024 1147   MONOABS 0.9 10/02/2024 1147   EOSABS 0.3 10/02/2024 1147   BASOSABS 0.1 10/02/2024 1147    BMET    Component Value Date/Time   NA 136 10/20/2024 1147   NA 138 03/21/2024 1320   K 4.1 10/20/2024 1147   CL 99 10/20/2024 1147   CO2 26 10/20/2024 1147   GLUCOSE 215 (H) 10/20/2024 1147   BUN 30 (H) 10/20/2024 1147   BUN 42 (H) 03/21/2024 1320   CREATININE 1.77 (H) 10/20/2024 1147   CREATININE 0.74 09/28/2016 1146   CALCIUM   8.9 10/20/2024 1147   GFRNONAA 39 (L) 10/20/2024 1147   GFRAA 68 11/04/2020 1529    BNP    Component Value Date/Time   BNP 490.7 (H) 09/08/2024 1610     ProBNP    Component Value Date/Time   PROBNP 3,116 (H) 10/02/2019 1131    Imaging: US  ELASTOGRAPHY LIVER Result Date: 10/09/2024 CLINICAL DATA:  Elevated liver function tests EXAM: US  ELASTOGRAPHY HEPATIC TECHNIQUE: Sonography of the liver was performed. In addition, ultrasound elastography evaluation of the liver was performed. A region of interest was placed within the right lobe of the liver. Following application of a compressive sonographic pulse, tissue compressibility was assessed. Multiple assessments were performed at the selected site. Median tissue compressibility was determined. Previously, hepatic stiffness was assessed by shear wave velocity. Based on recently published Society of Radiologists in Ultrasound consensus article, reporting is now recommended to be performed in the SI units of pressure (kiloPascals) representing hepatic stiffness/elasticity. The obtained result is compared to the published reference standards. (cACLD = compensated Advanced Chronic Liver Disease) COMPARISON:  CTA abdomen and pelvis dated 09/25/2024 open FINDINGS: Liver: No focal lesion identified. Within normal limits in parenchymal echogenicity. Portal vein is patent on color Doppler imaging with normal direction of blood flow towards the liver. ULTRASOUND HEPATIC ELASTOGRAPHY Device: Siemens Helix VTQ Patient position: Supine Transducer: DAX Number of measurements: 12 Hepatic segment:  8 Median kPa: 6.4 IQR: 2.4 IQR/Median kPa ratio: 0.37 Data quality:  Good Diagnostic category: < or = 9 kPa: in the absence of other known clinical signs, rules out cACLD The use of hepatic elastography is applicable to patients with viral hepatitis and non-alcoholic fatty liver disease. At this time, there is insufficient data for the referenced cut-off values and use in other causes of liver disease, including alcoholic liver disease. Patients, however, may be assessed by elastography and serve as their own reference  standard/baseline. In patients with non-alcoholic liver disease, the values suggesting compensated advanced chronic liver disease (cACLD) may be lower, and patients may need additional testing with elasticity results of 7-9 kPa. Please note that abnormal hepatic elasticity and shear wave velocities may also be identified in clinical settings other than with hepatic fibrosis, such as: acute hepatitis, elevated right heart and central venous pressures including use of beta blockers, veno-occlusive disease (Budd-Chiari), infiltrative processes such as mastocytosis/amyloidosis/infiltrative tumor/lymphoma, extrahepatic cholestasis, with hyperemia in the post-prandial state, and with liver transplantation. Correlation with patient history, laboratory data, and clinical condition recommended. Diagnostic Categories: < or =5 kPa: high probability of being normal < or =9 kPa: in the absence of other known clinical signs, rules out cACLD >9 kPa and ?13 kPa: suggestive of cACLD, but needs further testing >13 kPa: highly suggestive of cACLD > or =17 kPa: highly suggestive of cACLD with an increased probability of clinically significant portal hypertension IMPRESSION: ULTRASOUND LIVER: No focal hepatic lesions. ULTRASOUND HEPATIC ELASTOGRAPHY: Median kPa:  6.4 Diagnostic category: < or = 9 kPa: in the absence of other known clinical signs, rules out cACLD In the setting of elevated liver function tests, non-fasting state, or vascular congestion, the stage of liver fibrosis may be overestimated. In some patients with NAFLD, the cut-off values for cACLD may be lower (7-9 kPa). In causes other than viral hepatitis and NAFLD, the cut-off values are not well established. Electronically Signed   By: Limin  Xu M.D.   On: 10/09/2024 08:46   ECHOCARDIOGRAM COMPLETE Result Date: 10/05/2024    ECHOCARDIOGRAM REPORT   Patient Name:  [name] [name] Date of Exam: 10/05/2024 Medical Rec #:  [id number]             Height:       68.0  in Accession #:    [id number]            Weight:       169.2 lb Date of Birth:  1946-04-03             BSA:          1.904 m Patient Age:    78 years              BP:           150/62 mmHg Patient Gender: M                     HR:           74 bpm. Exam Location:  Outpatient Procedure: 2D Echo (Both Spectral and Color Flow Doppler were utilized during            procedure). Indications:    Congestive Heart Failure  History:        Patient has prior history of Echocardiogram examinations.  Sonographer:    Charmaine Gaskins Referring Phys: [phone] DALTON S MCLEAN IMPRESSIONS  1. Left ventricular ejection fraction, by estimation, is 50%. Left ventricular ejection fraction by 2D MOD biplane is 49.9 %. The left ventricle has mildly decreased function. The left ventricle demonstrates global hypokinesis. Left ventricular diastolic parameters are indeterminate.  2. Right ventricular systolic function is mildly reduced. The right ventricular size is normal. There is moderately elevated pulmonary artery systolic pressure. The estimated right ventricular systolic pressure is 56.3 mmHg.  3. Left atrial size was moderately dilated.  4. Right atrial size was mildly dilated.  5. The mitral valve is degenerative. Trivial mitral valve regurgitation. No evidence of mitral stenosis. Moderate mitral annular calcification.  6. Mechanical aortic valve. Mean gradient 9 mmHg, no significant stenosis. No significant regurgitation.  7. The inferior vena cava is normal in size with greater than 50% respiratory variability, suggesting right atrial pressure of 3 mmHg. FINDINGS  Left Ventricle: Left ventricular ejection fraction, by estimation, is 50%. Left ventricular ejection fraction by 2D MOD biplane is 49.9 %. The left ventricle has mildly decreased function. The left ventricle demonstrates global hypokinesis. The left ventricular internal cavity size was normal in size. There is no left ventricular hypertrophy. Left ventricular diastolic  parameters are indeterminate. Right Ventricle: The right ventricular size is normal. No increase in right ventricular wall thickness. Right ventricular systolic function is mildly reduced. There is moderately elevated pulmonary artery systolic pressure. The tricuspid regurgitant velocity is 3.65 m/s, and with an assumed right atrial pressure of 3 mmHg, the estimated right ventricular systolic pressure is 56.3 mmHg. Left Atrium: Left atrial size was moderately dilated. Right Atrium: Right atrial size was mildly dilated. Pericardium: There is no evidence of pericardial effusion. Mitral Valve: The mitral valve is degenerative in appearance. There is mild calcification of the mitral valve leaflet(s). Moderate mitral annular calcification. Trivial mitral valve regurgitation. No evidence of mitral valve stenosis. MV peak gradient, 11.2 mmHg. The mean mitral valve gradient is 4.0 mmHg. Tricuspid Valve: The tricuspid valve is normal in structure. Tricuspid valve regurgitation is trivial. Aortic Valve: Mechanical aortic valve. Mean gradient 9 mmHg, no significant stenosis. No significant regurgitation. Aortic valve mean gradient measures 9.0 mmHg. Aortic valve peak gradient measures 16.2 mmHg. Aortic valve area, by  VTI measures 1.50 cm. Pulmonic Valve: The pulmonic valve was normal in structure. Pulmonic valve regurgitation is trivial. Aorta: The aortic root is normal in size and structure. Venous: The inferior vena cava is normal in size with greater than 50% respiratory variability, suggesting right atrial pressure of 3 mmHg. IAS/Shunts: No atrial level shunt detected by color flow Doppler.  LEFT VENTRICLE PLAX 2D                        Biplane EF (MOD) LVIDd:         5.10 cm         LV Biplane EF:   Left LVIDs:         4.30 cm                          ventricular LV PW:         1.00 cm                          ejection LV IVS:        1.10 cm                          fraction by LVOT diam:     2.08 cm                           2D MOD LV SV:         70                               biplane is LV SV Index:   37                               49.9 %. LVOT Area:     3.40 cm                                Diastology                                LV e' medial:    7.40 cm/s LV Volumes (MOD)               LV E/e' medial:  21.5 LV vol d, MOD    73.9 ml       LV e' lateral:   10.00 cm/s A2C:                           LV E/e' lateral: 15.9 LV vol d, MOD    62.7 ml A4C: LV vol s, MOD    39.6 ml A2C: LV vol s, MOD    31.7 ml A4C: LV SV MOD A2C:   34.3 ml LV SV MOD A4C:   62.7 ml LV SV MOD BP:    35.5 ml RIGHT VENTRICLE RV Basal diam:  3.35 cm RV Mid diam:    2.69 cm RV S prime:     9.30 cm/s TAPSE (M-mode): 1.2 cm RVSP:           56.3 mmHg LEFT ATRIUM  Index        RIGHT ATRIUM           Index LA diam:        4.29 cm 2.25 cm/m   RA Pressure: 3.00 mmHg LA Vol (A2C):   77.8 ml 40.87 ml/m  RA Area:     24.70 cm LA Vol (A4C):   99.5 ml 52.27 ml/m  RA Volume:   81.40 ml  42.76 ml/m LA Biplane Vol: 90.1 ml 47.33 ml/m  AORTIC VALVE AV Area (Vmax):    1.50 cm AV Area (Vmean):   1.57 cm AV Area (VTI):     1.50 cm AV Vmax:           201.00 cm/s AV Vmean:          137.000 cm/s AV VTI:            0.463 m AV Peak Grad:      16.2 mmHg AV Mean Grad:      9.0 mmHg LVOT Vmax:         88.50 cm/s LVOT Vmean:        63.400 cm/s LVOT VTI:          0.205 m LVOT/AV VTI ratio: 0.44  AORTA Ao Root diam: 3.00 cm Ao Asc diam:  3.62 cm MITRAL VALVE                TRICUSPID VALVE MV Area (PHT): 3.87 cm     TR Peak grad:   53.3 mmHg MV Area VTI:   2.11 cm     TR Vmax:        365.00 cm/s MV Peak grad:  11.2 mmHg    Estimated RAP:  3.00 mmHg MV Mean grad:  4.0 mmHg     RVSP:           56.3 mmHg MV Vmax:       1.67 m/s MV Vmean:      83.4 cm/s    SHUNTS MV Decel Time: 196 msec     Systemic VTI:  0.20 m MV E velocity: 159.00 cm/s  Systemic Diam: 2.08 cm MV A velocity: 55.10 cm/s MV E/A ratio:  2.89 Dalton McleanMD Electronically signed by Ezra Kanner  Signature Date/Time: 10/05/2024/6:18:17 PM    Final      Assessment & Plan:    1. Chronic obstructive pulmonary disease, unspecified COPD type (HCC) (Primary) - Pulmonary Function Test; Future  2. Squamous cell carcinoma of left lung (HCC)  3. ILD (interstitial lung disease) (HCC) - Pulmonary Function Test; Future    Assessment and Plan Assessment & Plan Chronic obstructive pulmonary disease with emphysema and asthmatic component Former smoker quit >30 years ago. Moderate obstructive lung disease with some reversibility, consistent with COPD and an asthmatic component. HRCT scan showed emphysema. Symptoms include chest congestion, exertional fatigue, and occasional wheezing.  - Provided sample of ICS/LABA (Stiolto Respimat ) for COPD x2 weeks - Monitor for improvement in congestion, cough, and exertional fatigue - If inhaler is effective, continue use and request prescription - If no improvement after two weeks, discontinue inhaler - Check oxygen levels with exertion during visit today   Early interstitial lung disease (possible pulmonary fibrosis) CT scan showed very mild fibrotic changes at the lung bases, possibly representing early interstitial lung disease vs radiation changes. Minimal progression from 07/2022. Monitoring for progression is essential. Antifibrotic medications are considered if progression occurs, with potential side effects including nausea, vomiting, diarrhea, and weight loss.  - No  oxygen desaturations on simple walk testing today - Continue monitoring with CT scans with oncology  - Perform biannual breathing tests - Will consider antifibrotic medications if progression is noted  Hemoptysis Intermittent hemoptysis with sputum, possibly related to radiation damage and Coumadin  use. No significant changes in the pattern of hemoptysis. - Monitor hemoptysis in conjunction with COPD management  History of early stage squamous cell lung cancer (left lung)  treated with radiation Early stage squamous cell lung cancer treated with radiation. No evidence of recurrence or malignancy. Regular follow-ups with oncologist every six months. - Continue regular follow-ups with oncologist every six months    Almarie LELON Ferrari, NP 10/30/2024

## 2024-10-30 NOTE — Patient Instructions (Signed)
 Full PFT performed today.

## 2024-10-30 NOTE — Patient Instructions (Addendum)
  VISIT SUMMARY: You came in today for a follow-up after your breathing test and CT scan. We discussed your history of early stage squamous cell lung cancer, interstitial lung disease, and your current symptoms including chest congestion, fatigue, and occasional wheezing. We reviewed your test results and made a plan to manage your conditions.  YOUR PLAN: -CHRONIC OBSTRUCTIVE PULMONARY DISEASE (COPD) WITH EMPHYSEMA AND ASTHMATIC COMPONENT: COPD is a chronic lung disease that makes it hard to breathe, and emphysema is a type of COPD that damages the air sacs in your lungs. Your breathing test showed some improvement with a bronchodilator, indicating an asthmatic component. We provided you with a sample inhaler to help with your symptoms. Please monitor your congestion, cough, and fatigue, and check your oxygen levels. If the inhaler helps, we will prescribe it for you. If there is no improvement after two weeks, you should stop using it.  -EARLY INTERSTITIAL LUNG DISEASE (POSSIBLE PULMONARY FIBROSIS): Interstitial lung disease involves scarring of the lung tissue, which can make it hard to breathe. Your CT scan showed very minimal changes, and there has been no significant progression. We will continue to monitor your condition with CT scans and breathing tests every six months. If there is any progression, we may consider antifibrotic medications, which can have side effects like nausea, vomiting, diarrhea, and weight loss.  -HEMOPTYSIS: Hemoptysis is the coughing up of blood. Your hemoptysis is likely related to radiation damage and Coumadin  use. We will monitor this in conjunction with your COPD management.  -HISTORY OF EARLY STAGE SQUAMOUS CELL LUNG CANCER (LEFT LUNG) TREATED WITH RADIATION: You had early stage squamous cell lung cancer that was treated with radiation. There is no evidence of recurrence or new malignancy. You should continue your regular follow-ups with your oncologist every six  months.  INSTRUCTIONS: Please follow up with your oncologist every six months for cancer surveillance. Continue monitoring your symptoms and use the inhaler as directed. If you notice any changes or worsening of your symptoms, please contact our office.  Orders: 2 week sample Stiolot Respimat- take two puffs daily in the morning (let our office know if inhaler is helpful and you want prescription sent)  Follow-up 6 months (April 2026) with FU Dr. Theophilus- 30 mins PFT prior

## 2024-11-06 ENCOUNTER — Telehealth: Payer: Self-pay | Admitting: Primary Care

## 2024-11-06 DIAGNOSIS — E113292 Type 2 diabetes mellitus with mild nonproliferative diabetic retinopathy without macular edema, left eye: Secondary | ICD-10-CM | POA: Diagnosis not present

## 2024-11-06 DIAGNOSIS — H31093 Other chorioretinal scars, bilateral: Secondary | ICD-10-CM | POA: Diagnosis not present

## 2024-11-06 DIAGNOSIS — H43813 Vitreous degeneration, bilateral: Secondary | ICD-10-CM | POA: Diagnosis not present

## 2024-11-06 DIAGNOSIS — H15833 Staphyloma posticum, bilateral: Secondary | ICD-10-CM | POA: Diagnosis not present

## 2024-11-06 DIAGNOSIS — E113211 Type 2 diabetes mellitus with mild nonproliferative diabetic retinopathy with macular edema, right eye: Secondary | ICD-10-CM | POA: Diagnosis not present

## 2024-11-06 DIAGNOSIS — H35371 Puckering of macula, right eye: Secondary | ICD-10-CM | POA: Diagnosis not present

## 2024-11-06 DIAGNOSIS — H442E3 Degenerative myopia with other maculopathy, bilateral eye: Secondary | ICD-10-CM | POA: Diagnosis not present

## 2024-11-06 NOTE — Telephone Encounter (Signed)
 Please let patient know Dr. Theophilus is ok holding off referral to rheumatology, if having arthralgia let us  know

## 2024-11-06 NOTE — Telephone Encounter (Signed)
-----   Message from [facility] sent at 10/31/2024 10:35 AM EST ----- Regarding: RE: Pos ANA His RF was elevated too., We can hold off on rheumatology appointment if he is not having arthritis symptoms. Does not appear to have significant ILD on CT scan ----- Message ----- From: Hope Almarie ORN, NP Sent: 10/30/2024   5:07 PM EST To: Lonna Coder, MD Subject: Pos ANA                                        ANA weakly positive 1:40. Denies arthralgia or rashes  Are you ok with holding off on rheumatology apt, he has an apt but would prefer not to go if not needed  -[address]

## 2024-11-08 ENCOUNTER — Encounter: Payer: Self-pay | Admitting: Podiatry

## 2024-11-08 ENCOUNTER — Ambulatory Visit: Admitting: Podiatry

## 2024-11-08 DIAGNOSIS — B351 Tinea unguium: Secondary | ICD-10-CM | POA: Diagnosis not present

## 2024-11-08 DIAGNOSIS — D689 Coagulation defect, unspecified: Secondary | ICD-10-CM | POA: Diagnosis not present

## 2024-11-08 DIAGNOSIS — I739 Peripheral vascular disease, unspecified: Secondary | ICD-10-CM

## 2024-11-08 NOTE — Progress Notes (Signed)
 This patient returns to my office for at risk foot care.  This patient requires this care by a professional since this patient will be at risk due to having diabetes , chronic anticoagulation renal disease and PVD.  Patient is taking coumadin .  This patient is unable to cut nails himself since the patient cannot reach his nails.These nails are painful walking and wearing shoes.  .  This patient presents for at risk foot care today.  General Appearance  Alert, conversant and in no acute stress.  Vascular  Dorsalis pedis and posterior tibial  pulses are  weakly palpable left foot.    Capillary return is within normal limits  bilaterally. Temperature is within normal limits  bilaterally.  Neurologic  Senn-Weinstein monofilament wire test within normal limits  bilaterally. Muscle power within normal limits bilaterally.  Nails Thick disfigured discolored nails with subungual debris  from hallux to fifth toes bilaterally. No evidence of bacterial infection or drainage bilaterally.  Orthopedic  No limitations of motion  feet .  No crepitus or effusions noted.  No bony pathology or digital deformities noted.  Skin  normotropic skin with no porokeratosis noted bilaterally.  No signs of infections or ulcers noted.     Onychomycosis  Pain in right toes  Pain in left toes  Consent was obtained for treatment procedures.   Mechanical debridement of nails 1-5  bilaterally performed with a nail nipper.  Filed with dremel without incident.    Return office visit    4 months                  Told patient to return for periodic foot care and evaluation due to potential at risk complications.   Cordella Bold DPM

## 2024-11-09 NOTE — Telephone Encounter (Signed)
 ATC X1. LMTCB

## 2024-11-10 NOTE — Telephone Encounter (Signed)
 ATCx2 left detailed VM per DPR. Also sent MyChart message. NFN

## 2024-11-16 ENCOUNTER — Ambulatory Visit: Attending: Cardiovascular Disease | Admitting: *Deleted

## 2024-11-16 DIAGNOSIS — I359 Nonrheumatic aortic valve disorder, unspecified: Secondary | ICD-10-CM | POA: Diagnosis not present

## 2024-11-16 DIAGNOSIS — Z5181 Encounter for therapeutic drug level monitoring: Secondary | ICD-10-CM

## 2024-11-16 DIAGNOSIS — G459 Transient cerebral ischemic attack, unspecified: Secondary | ICD-10-CM | POA: Diagnosis not present

## 2024-11-16 LAB — POCT INR: POC INR: 2.8

## 2024-11-16 NOTE — Patient Instructions (Signed)
 Description   Inr 2.8; Continue taking warfarin 1 tablet daily except for 1/2 a tablet on Mondays and Fridays. Recheck INR in 5 weeks.  Coumadin  Clinic for any changes in medications or upcoming procedures.  Coumadin  Clinic [phone] *Per pt : Amio reduced to 100 mg daily May 2025 by HF clinic*

## 2024-11-16 NOTE — Progress Notes (Signed)
 Lab Results  Component Value Date   INR 2.8 11/16/2024   INR 2.4 10/19/2024   INR 2.6 09/21/2024   PROTIME 16.1 05/22/2009   PROTIME 23.6 05/08/2009    Description   Inr 2.8; Continue taking warfarin 1 tablet daily except for 1/2 a tablet on Mondays and Fridays. Recheck INR in 5 weeks.  Coumadin  Clinic for any changes in medications or upcoming procedures.  Coumadin  Clinic [phone] *Per pt : Amio reduced to 100 mg daily May 2025 by HF clinic*

## 2024-12-07 MED ORDER — TIOTROPIUM BROMIDE-OLODATEROL 2.5-2.5 MCG/ACT IN AERS: 2.0000 | INHALATION_SPRAY | Freq: Every day | RESPIRATORY_TRACT | 5 refills | Status: AC

## 2024-12-07 NOTE — Addendum Note (Signed)
 Addended by: HOPE ALMARIE ORN on: 12/07/2024 02:37 PM   Modules accepted: Orders

## 2024-12-07 NOTE — Telephone Encounter (Signed)
 Great. I will send in RX

## 2024-12-21 ENCOUNTER — Ambulatory Visit: Attending: Cardiovascular Disease | Admitting: *Deleted

## 2024-12-21 DIAGNOSIS — I359 Nonrheumatic aortic valve disorder, unspecified: Secondary | ICD-10-CM

## 2024-12-21 DIAGNOSIS — Z5181 Encounter for therapeutic drug level monitoring: Secondary | ICD-10-CM | POA: Diagnosis not present

## 2024-12-21 DIAGNOSIS — G459 Transient cerebral ischemic attack, unspecified: Secondary | ICD-10-CM | POA: Diagnosis not present

## 2024-12-21 LAB — POCT INR: POC INR: 1.9

## 2024-12-21 NOTE — Progress Notes (Signed)
 Lab Results  Component Value Date   INR 1.9 12/21/2024   INR 2.8 11/16/2024   INR 2.4 10/19/2024   PROTIME 16.1 05/22/2009   PROTIME 23.6 05/08/2009    Description   Inr 1.9; Take 1.5 tablets of warfarin today and 1 tablet of warfarin tomorrow then continue taking warfarin 1 tablet daily except for 1/2 a tablet on Mondays and Fridays. Recheck INR in 3 weeks.  Coumadin  Clinic for any changes in medications or upcoming procedures.  Coumadin  Clinic [phone] *Per pt : Amio reduced to 100 mg daily May 2025 by HF clinic*

## 2024-12-21 NOTE — Patient Instructions (Signed)
 Description   Inr 1.9; Take 1.5 tablets of warfarin today and 1 tablet of warfarin tomorrow then continue taking warfarin 1 tablet daily except for 1/2 a tablet on Mondays and Fridays. Recheck INR in 3 weeks.  Coumadin  Clinic for any changes in medications or upcoming procedures.  Coumadin  Clinic [phone] *Per pt : Amio reduced to 100 mg daily May 2025 by HF clinic*

## 2025-01-11 ENCOUNTER — Ambulatory Visit: Admitting: *Deleted

## 2025-01-11 DIAGNOSIS — I359 Nonrheumatic aortic valve disorder, unspecified: Secondary | ICD-10-CM | POA: Diagnosis not present

## 2025-01-11 DIAGNOSIS — G459 Transient cerebral ischemic attack, unspecified: Secondary | ICD-10-CM

## 2025-01-11 DIAGNOSIS — Z5181 Encounter for therapeutic drug level monitoring: Secondary | ICD-10-CM | POA: Diagnosis not present

## 2025-01-11 LAB — POCT INR: POC INR: 2.8

## 2025-01-11 NOTE — Patient Instructions (Signed)
 Description   Inr 2.8; continue taking warfarin 1 tablet daily except for 1/2 a tablet on Mondays and Fridays. Recheck INR in 4 weeks.  Coumadin  Clinic for any changes in medications or upcoming procedures.  Coumadin  Clinic [phone] *Per pt : Amio reduced to 100 mg daily May 2025 by HF clinic*

## 2025-01-11 NOTE — Progress Notes (Signed)
 Lab Results  Component Value Date   INR 2.8 01/11/2025   INR 1.9 12/21/2024   INR 2.8 11/16/2024   PROTIME 16.1 05/22/2009   PROTIME 23.6 05/08/2009    Description   Inr 2.8; continue taking warfarin 1 tablet daily except for 1/2 a tablet on Mondays and Fridays. Recheck INR in 4 weeks.  Coumadin  Clinic for any changes in medications or upcoming procedures.  Coumadin  Clinic [phone] *Per pt : Amio reduced to 100 mg daily May 2025 by HF clinic*

## 2025-01-18 ENCOUNTER — Ambulatory Visit: Admitting: Internal Medicine

## 2025-01-31 ENCOUNTER — Other Ambulatory Visit (HOSPITAL_COMMUNITY)

## 2025-01-31 ENCOUNTER — Ambulatory Visit (HOSPITAL_BASED_OUTPATIENT_CLINIC_OR_DEPARTMENT_OTHER)

## 2025-02-05 ENCOUNTER — Ambulatory Visit: Payer: Self-pay | Admitting: Radiation Oncology

## 2025-02-08 ENCOUNTER — Ambulatory Visit

## 2025-03-07 ENCOUNTER — Ambulatory Visit: Admitting: Podiatry

## 2025-03-19 ENCOUNTER — Ambulatory Visit (HOSPITAL_COMMUNITY)

## 2025-03-19 ENCOUNTER — Ambulatory Visit: Admitting: Surgery
